# Patient Record
Sex: Male | Born: 1965 | Race: Black or African American | Hispanic: No | Marital: Married | Smoking: Former smoker
Health system: Southern US, Community
[De-identification: ages and names within clinical notes are randomized; demographics above are authoritative.]

## PROBLEM LIST (undated history)

## (undated) DIAGNOSIS — I340 Nonrheumatic mitral (valve) insufficiency: Secondary | ICD-10-CM

## (undated) DIAGNOSIS — I10 Essential (primary) hypertension: Secondary | ICD-10-CM

## (undated) DIAGNOSIS — Z992 Dependence on renal dialysis: Secondary | ICD-10-CM

## (undated) DIAGNOSIS — I351 Nonrheumatic aortic (valve) insufficiency: Secondary | ICD-10-CM

## (undated) DIAGNOSIS — I209 Angina pectoris, unspecified: Secondary | ICD-10-CM

## (undated) DIAGNOSIS — I2510 Atherosclerotic heart disease of native coronary artery without angina pectoris: Secondary | ICD-10-CM

## (undated) DIAGNOSIS — Z951 Presence of aortocoronary bypass graft: Secondary | ICD-10-CM

## (undated) DIAGNOSIS — I499 Cardiac arrhythmia, unspecified: Secondary | ICD-10-CM

## (undated) DIAGNOSIS — R55 Syncope and collapse: Secondary | ICD-10-CM

## (undated) DIAGNOSIS — N289 Disorder of kidney and ureter, unspecified: Secondary | ICD-10-CM

## (undated) DIAGNOSIS — R000 Tachycardia, unspecified: Secondary | ICD-10-CM

## (undated) DIAGNOSIS — Z954 Presence of other heart-valve replacement: Secondary | ICD-10-CM

## (undated) DIAGNOSIS — E663 Overweight: Secondary | ICD-10-CM

## (undated) DIAGNOSIS — M79606 Pain in leg, unspecified: Secondary | ICD-10-CM

## (undated) DIAGNOSIS — I729 Aneurysm of unspecified site: Secondary | ICD-10-CM

## (undated) DIAGNOSIS — I951 Orthostatic hypotension: Secondary | ICD-10-CM

## (undated) DIAGNOSIS — N186 End stage renal disease: Secondary | ICD-10-CM

## (undated) DIAGNOSIS — I509 Heart failure, unspecified: Secondary | ICD-10-CM

## (undated) DIAGNOSIS — R0789 Other chest pain: Secondary | ICD-10-CM

## (undated) HISTORY — DX: Tachycardia, unspecified: R00.0

## (undated) HISTORY — PX: ARTERIOVENOUS GRAFT PLACEMENT: SUR1029

## (undated) HISTORY — DX: Orthostatic hypotension: I95.1

## (undated) HISTORY — PX: UMBILICAL HERNIA REPAIR: SHX196

## (undated) HISTORY — PX: THROMBECTOMY: PRO61

## (undated) HISTORY — PX: OTHER SURGICAL HISTORY: SHX169

## (undated) HISTORY — PX: AV FISTULA PLACEMENT: SHX1204

## (undated) HISTORY — DX: Overweight: E66.3

## (undated) HISTORY — DX: Other chest pain: R07.89

## (undated) HISTORY — DX: Heart failure, unspecified: I50.9

## (undated) HISTORY — DX: Syncope and collapse: R55

## (undated) HISTORY — DX: Essential (primary) hypertension: I10

## (undated) HISTORY — DX: Aneurysm of unspecified site: I72.9

## (undated) HISTORY — DX: End stage renal disease: N18.6

## (undated) HISTORY — PX: HERNIA REPAIR: SHX51

## (undated) HISTORY — DX: Atherosclerotic heart disease of native coronary artery without angina pectoris: I25.10

## (undated) HISTORY — DX: Pain in leg, unspecified: M79.606

---

## 1998-04-23 ENCOUNTER — Ambulatory Visit (HOSPITAL_COMMUNITY): Admission: RE | Admit: 1998-04-23 | Discharge: 1998-04-23 | Payer: Self-pay | Admitting: Family Medicine

## 1998-10-16 ENCOUNTER — Ambulatory Visit (HOSPITAL_COMMUNITY): Admission: RE | Admit: 1998-10-16 | Discharge: 1998-10-16 | Payer: Self-pay

## 1998-11-05 ENCOUNTER — Ambulatory Visit (HOSPITAL_COMMUNITY): Admission: RE | Admit: 1998-11-05 | Discharge: 1998-11-05 | Payer: Self-pay

## 1998-12-17 ENCOUNTER — Ambulatory Visit (HOSPITAL_COMMUNITY): Admission: RE | Admit: 1998-12-17 | Discharge: 1998-12-17 | Payer: Self-pay

## 2001-07-26 ENCOUNTER — Ambulatory Visit (HOSPITAL_COMMUNITY): Admission: RE | Admit: 2001-07-26 | Discharge: 2001-07-26 | Payer: Self-pay | Admitting: Family Medicine

## 2001-07-26 ENCOUNTER — Encounter: Payer: Self-pay | Admitting: Family Medicine

## 2002-06-04 ENCOUNTER — Inpatient Hospital Stay (HOSPITAL_COMMUNITY): Admission: EM | Admit: 2002-06-04 | Discharge: 2002-06-09 | Payer: Self-pay | Admitting: Nephrology

## 2002-06-04 ENCOUNTER — Encounter: Payer: Self-pay | Admitting: Nephrology

## 2002-06-06 ENCOUNTER — Encounter: Payer: Self-pay | Admitting: Nephrology

## 2002-06-07 ENCOUNTER — Encounter: Payer: Self-pay | Admitting: Vascular Surgery

## 2002-07-18 ENCOUNTER — Encounter: Payer: Self-pay | Admitting: Vascular Surgery

## 2002-07-18 ENCOUNTER — Ambulatory Visit (HOSPITAL_COMMUNITY): Admission: RE | Admit: 2002-07-18 | Discharge: 2002-07-18 | Payer: Self-pay | Admitting: Vascular Surgery

## 2002-07-25 ENCOUNTER — Ambulatory Visit (HOSPITAL_COMMUNITY): Admission: RE | Admit: 2002-07-25 | Discharge: 2002-07-25 | Payer: Self-pay | Admitting: *Deleted

## 2002-08-29 ENCOUNTER — Ambulatory Visit (HOSPITAL_COMMUNITY): Admission: RE | Admit: 2002-08-29 | Discharge: 2002-08-29 | Payer: Self-pay | Admitting: Vascular Surgery

## 2002-10-01 ENCOUNTER — Ambulatory Visit (HOSPITAL_COMMUNITY): Admission: RE | Admit: 2002-10-01 | Discharge: 2002-10-01 | Payer: Self-pay | Admitting: Vascular Surgery

## 2003-03-06 ENCOUNTER — Inpatient Hospital Stay (HOSPITAL_COMMUNITY): Admission: EM | Admit: 2003-03-06 | Discharge: 2003-03-11 | Payer: Self-pay | Admitting: Emergency Medicine

## 2003-08-19 ENCOUNTER — Ambulatory Visit (HOSPITAL_COMMUNITY): Admission: RE | Admit: 2003-08-19 | Discharge: 2003-08-19 | Payer: Self-pay | Admitting: *Deleted

## 2005-01-12 ENCOUNTER — Ambulatory Visit (HOSPITAL_COMMUNITY): Admission: RE | Admit: 2005-01-12 | Discharge: 2005-01-12 | Payer: Self-pay | Admitting: Nephrology

## 2006-03-20 ENCOUNTER — Ambulatory Visit (HOSPITAL_COMMUNITY): Admission: RE | Admit: 2006-03-20 | Discharge: 2006-03-20 | Payer: Self-pay | Admitting: Nephrology

## 2006-05-09 ENCOUNTER — Ambulatory Visit: Payer: Self-pay | Admitting: Cardiology

## 2006-05-16 ENCOUNTER — Ambulatory Visit: Payer: Self-pay | Admitting: Cardiology

## 2006-05-31 ENCOUNTER — Ambulatory Visit: Payer: Self-pay | Admitting: Cardiology

## 2009-05-15 ENCOUNTER — Ambulatory Visit (HOSPITAL_COMMUNITY): Admission: RE | Admit: 2009-05-15 | Discharge: 2009-05-15 | Payer: Self-pay | Admitting: Nephrology

## 2009-06-04 ENCOUNTER — Ambulatory Visit: Payer: Self-pay | Admitting: Vascular Surgery

## 2009-06-05 ENCOUNTER — Ambulatory Visit (HOSPITAL_COMMUNITY): Admission: RE | Admit: 2009-06-05 | Discharge: 2009-06-05 | Payer: Self-pay | Admitting: Vascular Surgery

## 2009-06-05 ENCOUNTER — Ambulatory Visit: Payer: Self-pay | Admitting: Vascular Surgery

## 2009-07-09 ENCOUNTER — Ambulatory Visit: Payer: Self-pay | Admitting: Vascular Surgery

## 2009-07-14 ENCOUNTER — Ambulatory Visit: Payer: Self-pay | Admitting: Vascular Surgery

## 2009-07-14 ENCOUNTER — Ambulatory Visit (HOSPITAL_COMMUNITY): Admission: RE | Admit: 2009-07-14 | Discharge: 2009-07-14 | Payer: Self-pay | Admitting: Vascular Surgery

## 2009-08-06 ENCOUNTER — Ambulatory Visit (HOSPITAL_COMMUNITY): Admission: RE | Admit: 2009-08-06 | Discharge: 2009-08-06 | Payer: Self-pay | Admitting: Nephrology

## 2010-06-07 ENCOUNTER — Emergency Department (HOSPITAL_COMMUNITY)
Admission: EM | Admit: 2010-06-07 | Discharge: 2010-06-07 | Disposition: A | Discharge status: Home / Self Care | Payer: Medicare Other | Attending: Emergency Medicine | Admitting: Emergency Medicine

## 2010-06-07 ENCOUNTER — Emergency Department (HOSPITAL_COMMUNITY): Payer: Medicare Other

## 2010-06-07 DIAGNOSIS — R0602 Shortness of breath: Secondary | ICD-10-CM | POA: Insufficient documentation

## 2010-06-07 DIAGNOSIS — N19 Unspecified kidney failure: Secondary | ICD-10-CM | POA: Insufficient documentation

## 2010-06-07 DIAGNOSIS — Z7982 Long term (current) use of aspirin: Secondary | ICD-10-CM | POA: Insufficient documentation

## 2010-06-07 DIAGNOSIS — R0989 Other specified symptoms and signs involving the circulatory and respiratory systems: Secondary | ICD-10-CM | POA: Insufficient documentation

## 2010-06-07 DIAGNOSIS — Z79899 Other long term (current) drug therapy: Secondary | ICD-10-CM | POA: Insufficient documentation

## 2010-06-07 DIAGNOSIS — R0609 Other forms of dyspnea: Secondary | ICD-10-CM | POA: Insufficient documentation

## 2010-06-07 DIAGNOSIS — R000 Tachycardia, unspecified: Secondary | ICD-10-CM | POA: Insufficient documentation

## 2010-06-07 DIAGNOSIS — R079 Chest pain, unspecified: Secondary | ICD-10-CM | POA: Insufficient documentation

## 2010-06-07 LAB — CK TOTAL AND CKMB (NOT AT ARMC)
CK, MB: 1.5 ng/mL (ref 0.3–4.0)
Relative Index: 0.9 (ref 0.0–2.5)

## 2010-06-07 LAB — DIFFERENTIAL
Basophils Absolute: 0.1 10*3/uL (ref 0.0–0.1)
Basophils Relative: 0 % (ref 0–1)
Eosinophils Absolute: 0.3 10*3/uL (ref 0.0–0.7)
Eosinophils Relative: 2 % (ref 0–5)
Lymphocytes Relative: 9 % — ABNORMAL LOW (ref 12–46)
Lymphs Abs: 1.5 10*3/uL (ref 0.7–4.0)
Monocytes Absolute: 1 10*3/uL (ref 0.1–1.0)
Monocytes Relative: 6 % (ref 3–12)
Neutro Abs: 13.8 10*3/uL — ABNORMAL HIGH (ref 1.7–7.7)
Neutrophils Relative %: 83 % — ABNORMAL HIGH (ref 43–77)

## 2010-06-07 LAB — COMPREHENSIVE METABOLIC PANEL
ALT: 13 U/L (ref 0–53)
AST: 14 U/L (ref 0–37)
Albumin: 3.6 g/dL (ref 3.5–5.2)
Alkaline Phosphatase: 62 U/L (ref 39–117)
BUN: 39 mg/dL — ABNORMAL HIGH (ref 6–23)
CO2: 22 mEq/L (ref 19–32)
Calcium: 9.1 mg/dL (ref 8.4–10.5)
Chloride: 98 mEq/L (ref 96–112)
Creatinine, Ser: 10.64 mg/dL — ABNORMAL HIGH (ref 0.4–1.5)
GFR calc Af Amer: 6 mL/min — ABNORMAL LOW (ref >60)
GFR calc non Af Amer: 5 mL/min — ABNORMAL LOW (ref >60)
Glucose, Bld: 87 mg/dL (ref 70–99)
Potassium: 3.7 mEq/L (ref 3.5–5.1)
Sodium: 136 mEq/L (ref 135–145)
Total Bilirubin: 0.8 mg/dL (ref 0.3–1.2)
Total Protein: 8.7 g/dL — ABNORMAL HIGH (ref 6.0–8.3)

## 2010-06-07 LAB — CBC
HCT: 43.7 % (ref 39.0–52.0)
Hemoglobin: 14.9 g/dL (ref 13.0–17.0)
MCH: 26.3 pg (ref 26.0–34.0)
MCHC: 34.1 g/dL (ref 30.0–36.0)
MCV: 77.1 fL — ABNORMAL LOW (ref 78.0–100.0)
Platelets: 256 10*3/uL (ref 150–400)
RBC: 5.67 MIL/uL (ref 4.22–5.81)
RDW: 14 % (ref 11.5–15.5)
WBC: 16.8 10*3/uL — ABNORMAL HIGH (ref 4.0–10.5)

## 2010-06-07 LAB — APTT: aPTT: 29 seconds (ref 24–37)

## 2010-06-07 LAB — POCT CARDIAC MARKERS
CKMB, poc: 2.3 ng/mL (ref 1.0–8.0)
Myoglobin, poc: >500 ng/mL (ref 12–200)
Troponin i, poc: <0.05 ng/mL (ref 0.00–0.09)

## 2010-06-07 LAB — PROTIME-INR
INR: 1.04 (ref 0.00–1.49)
Prothrombin Time: 13.8 seconds (ref 11.6–15.2)

## 2010-06-07 LAB — TROPONIN I: Troponin I: 0.02 ng/mL (ref 0.00–0.06)

## 2010-06-07 LAB — D-DIMER, QUANTITATIVE: D-Dimer, Quant: 2.32 ug/mL-FEU — ABNORMAL HIGH (ref 0.00–0.48)

## 2010-06-07 LAB — BRAIN NATRIURETIC PEPTIDE: Pro B Natriuretic peptide (BNP): <30 pg/mL (ref 0.0–100.0)

## 2010-06-15 ENCOUNTER — Ambulatory Visit (HOSPITAL_COMMUNITY)
Admission: RE | Admit: 2010-06-15 | Discharge: 2010-06-15 | Disposition: A | Discharge status: Home / Self Care | Payer: Medicare Other | Source: Ambulatory Visit | Attending: Surgery | Admitting: Surgery

## 2010-06-15 DIAGNOSIS — T82898A Other specified complication of vascular prosthetic devices, implants and grafts, initial encounter: Secondary | ICD-10-CM

## 2010-06-15 DIAGNOSIS — Y832 Surgical operation with anastomosis, bypass or graft as the cause of abnormal reaction of the patient, or of later complication, without mention of misadventure at the time of the procedure: Secondary | ICD-10-CM | POA: Insufficient documentation

## 2010-06-15 DIAGNOSIS — I120 Hypertensive chronic kidney disease with stage 5 chronic kidney disease or end stage renal disease: Secondary | ICD-10-CM

## 2010-06-15 DIAGNOSIS — N186 End stage renal disease: Secondary | ICD-10-CM

## 2010-06-15 LAB — POCT I-STAT, CHEM 8
Calcium, Ion: 0.95 mmol/L — ABNORMAL LOW (ref 1.12–1.32)
Glucose, Bld: 83 mg/dL (ref 70–99)
HCT: 48 % (ref 39.0–52.0)
Hemoglobin: 16.3 g/dL (ref 13.0–17.0)
Potassium: 4.1 mEq/L (ref 3.5–5.1)

## 2010-06-20 NOTE — Op Note (Addendum)
  [name] [name], [name] [name]                 ACCOUNT NO.:  [id number]  MEDICAL RECORD NO.:  [id number]           PATIENT TYPE:  O  LOCATION:  SDSC                         FACILITY:  [facility]  PHYSICIAN:  Theotis Burrow IV, MDDATE OF BIRTH:  [birth year]  DATE OF PROCEDURE:  06/15/2010 DATE OF DISCHARGE:  06/15/2010                              OPERATIVE REPORT   PREOPERATIVE DIAGNOSIS:  End-stage renal disease.  POSTOPERATIVE DIAGNOSIS:  End-stage renal disease.  PROCEDURE PERFORMED: 1. Ultrasound access right forearm Gore-Tex graft. 2. Fistulogram. 3. Percutaneous transluminal angioplasty, right forearm dialysis     graft.  INDICATIONS:  [name] is a 45 year old gentleman with end-stage renal disease.  He is having trouble with access and cannulation and dialysis. He had a thrombectomy and revision of his forearm graft approximately a year ago.  He comes in today for evaluation.  PROCEDURE:  The patient was identified in the holding area, taken to room #8, placed supine on the table.  Right arm was prepped and draped in usual fashion.  A time-out was called.  Ultrasound was used to evaluate the graft, this was found to be widely patent, 1% lidocaine was used for local anesthesia, and the graft was accessed under ultrasound guidance with a micropuncture needle and an 0.18 wire was advanced into the graft, and a micropuncture sheath was placed.  Contrast injections were then performed.  Reflux was used to evaluate the arterial anastomosis.  FINDINGS:  The right forearm graft is widely patent.  The arteriovenous anastomosis is widely patent.  There is an area of luminal narrowing within the venous limb of the graft itself.  The venous outflow track is widely patent.  There is an area of luminal narrowing, which is not hemodynamically significant in the proximal arm.  There is no central venous stenosis.  Overall, the graft looked patent.  However, with the access troubles, I felt  that dilating the venous limb of the graft was appropriate given its somewhat irregular appearance.  Over a Bentson wire, a 6-French sheath was placed and a 6 x 4 Dorado balloon was used to perform balloon angioplasty of the venous limb of the graft.  A completion arteriogram was performed, which showed improvement in the appearance of the venous limb of the graft.  At this point, catheters and wires were removed.  A temporary closure was performed with a stop-cock.  The patient will be taken to the holding area.  There are no complication.  IMPRESSION: 1. No evidence of central venous stenosis. 2. Somewhat irregular appearance of the venous side of the forearm     graft.  This was dilated with a 6 x 4 Dorado balloon with good     results.     Eldridge Abrahams, MD     VWB/MEDQ  D:  06/15/2010  T:  06/15/2010  Job:  [id number]  Electronically Signed by Orvan Falconer IV MD on 06/20/2010 07:40:52 PM

## 2010-07-07 LAB — POCT I-STAT 4, (NA,K, GLUC, HGB,HCT): Glucose, Bld: 84 mg/dL (ref 70–99)

## 2010-07-11 LAB — POCT I-STAT 4, (NA,K, GLUC, HGB,HCT)
Glucose, Bld: 92 mg/dL (ref 70–99)
Hemoglobin: 15 g/dL (ref 13.0–17.0)
Potassium: 4.1 mEq/L (ref 3.5–5.1)

## 2010-08-31 NOTE — Assessment & Plan Note (Signed)
OFFICE VISIT   [name] [name], [name] [name]  DOB:  1965/09/21                                       06/04/2009  [id number]   I saw the patient in the office today to evaluate him for access.  He  had a right upper arm AV fistula placed in November of 2004 and he has  been using this fistula since that time.  He developed some aneurysmal  areas and underwent a fistulogram on 05/15/2009 which demonstrated some  central outflow cephalic vein occlusion secondary to clot and this could  not be successfully declotted.  He had significant collaterals  maintaining patency of the fistula.  We were asked to evaluate the  fistula and consider future access.  Of note he dialyzes on Mondays,  Wednesdays and Fridays.  He has had no recent shortness of breath,  nausea or other uremic symptoms.  He has had no pain associated with use  of the fistula.  He states the fistula has been working adequately.  Of  note, he had a fistula in the upper arm on the left side which became  aneurysmal and apparently had a significant bleeding complication  related to this.   PAST MEDICAL HISTORY:  Significant for congestive heart failure which  has been stable and he has had no recent problems with his heart  failure.   Otherwise the patient denies any history of diabetes, hypertension,  hypercholesterolemia, history of previous myocardial infarction or  history of COPD.   SOCIAL HISTORY:  He is married.  He has two children.  He quit tobacco  17 years ago.   REVIEW OF SYSTEMS:  He has lost some weight recently, and he has had a  poor appetite.  He is 365 pounds and 6 feet tall.  CARDIOVASCULAR:  He has had no chest pain, chest pressure, palpitations  or arrhythmias.  He has had no significant orthopnea or dyspnea on  exertion.  He has some mild leg claudication.  He has had no history of  stroke, TIAs.  PULMONARY:  He has had no productive cough, bronchitis, asthma or  wheezing.   PHYSICAL EXAMINATION:  General:  This is a pleasant 45 year old  gentleman who appears his stated age.  Vital signs:  His blood pressure  is 109/73, heart rate is 83, temperature 97.7.  HEENT:  Unremarkable.  Lungs:  Are clear bilaterally to auscultation without rales, rhonchi or  wheezing.  Cardiovascular:  He has a regular rate and rhythm without  murmur appreciated.  He has mild peripheral edema bilaterally.  Both  feet are warm and well-perfused.  Abdomen:  Soft and nontender with  normal pitched bowel sounds.  Extremities:  Exam reveals an upper arm  fistula which has a good thrill and bruit.  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  Neurologic:  Exam is nonfocal.   I have explained that I think these large aneurysms in the right arm are  at risk for continued enlargement and especially the more centrally  located aneurysm has thinned out skin over it and I think he is at risk  for significant bleeding if this were to rupture.  For this reason I do  not think there is any way to salvage the upper arm fistula and  I have  recommended we place a graft in the right arm.  We would also ligate his  fistula to prevent the risk of bleeding.  He would therefore temporarily  require a Palindrome catheter for dialysis for a month until his graft  could be used.  Given the bleeding problems he had in the left arm the  family is very anxious to get this done as soon as possible so we will  schedule this for tomorrow and he will change his dialysis days which is  normally Monday, Wednesday, Friday.     Judeth Cornfield. Scot Dock, M.D.  Electronically Signed   CSD/MEDQ  D:  06/04/2009  T:  06/05/2009  Job:  [id number]   cc:   Alison Murray, M.D.

## 2010-08-31 NOTE — Assessment & Plan Note (Signed)
OFFICE VISIT   [name] [name], CUNY R  DOB:  09/21/1965                                       07/09/2009  [id number]   DATE OF SURGERY:  06/05/2009.   Patient returns to clinic for evaluation of his right forearm graft due  to a suture exposure.  As an incidental finding on the 21st, it was  noted that his graft was occluded.   Past medical history is consistent with congestive heart failure,  parathyroid removal, and __________  hernia.  He dialyzes on Monday,  Wednesday, and Fridays.   SOCIAL HISTORY:  He is married with 2 children, and he is disabled.  He  has a remote history of tobacco use with discontinuation 15 years ago.  He does not drink alcohol on a regular basis.   Review of systems was with the exception of kidney disease and rash on  his back, negative.   Physical findings revealed a very pleasant gentleman who appeared his  stated age.  His height was 6.1.  His weight was 178 pounds.  Heart rate  was 79.  Blood pressure 133/95.  O2 sat was 97%.  He appeared well-  nourished was in no distress.  HEENT:  PERRLA, EOMI, normal  conjunctivae.  Mucous membranes were pink and moist.  Lungs were clear.  Cardiac exam revealed a regular rate and rhythm.  The abdomen was soft,  nontender.  Musculoskeletal evaluation demonstrated no major  deformities.  There were no focal weaknesses or paresthesias.  Skin  demonstrated no ulcers.  A rash was present on his back.  Attention was  turned to his right forearm arteriovenous graft.  It was clotted.  There  was no thrill or pulsations in it.  There was a suture in the incision.  This was cut.   This case was discussed with Dr. Scot Dock.  Patient will need a  thrombectomy.  We are scheduling this for 07/14/2009.  Of note, Dr.  Scot Dock enter the room to speak with patient.  He did evaluate the graft  and did agree with the thrombectomy plans.   Chad Cordial, PA   Judeth Cornfield. Scot Dock, M.D.  Electronically Signed   KEL/MEDQ  D:  07/09/2009  T:  07/09/2009  Job:  [id number]

## 2010-08-31 NOTE — Procedures (Signed)
CEPHALIC VEIN MAPPING   INDICATION:  Preop evaluation for dialysis access site.   HISTORY:  Chronic kidney disease.   EXAM:  The right basilic vein is compressible with diameter measurements  ranging from 0.32 to 0.79 cm.   The left basilic vein is compressible with diameter measurements ranging  from 0.33 to 0.82 cm.   See attached worksheet for all measurements.   IMPRESSION:  1. Patent bilateral basilic veins noted with diameter measurements as      described above.  2. The bilateral cephalic veins were not imaged at the request of Dr.      Scot Dock.   ___________________________________________  Judeth Cornfield. Scot Dock, M.D.   CH/MEDQ  D:  06/04/2009  T:  06/04/2009  Job:  [id number]

## 2010-09-03 NOTE — Discharge Summary (Signed)
NAMEGRIMM, BASSANI                           ACCOUNT NO.:  [id number]   MEDICAL RECORD NO.:  [id number]                   PATIENT TYPE:  INP   LOCATION:  5527                                 FACILITY:  [facility]   PHYSICIAN:  Reginia Forts, M.D.                  DATE OF BIRTH:  [birth year]   DATE OF ADMISSION:  06/04/2002  DATE OF DISCHARGE:  06/09/2002                                 DISCHARGE SUMMARY   CONSULTS:  1. CVTS.  2. Nutrition.  3. Social work.   PROCEDURES:  1. Placement of left IJ __________ catheter by CVTS.  2. Placement of left forearm AV graft by CVTS.  3. Vein mapping of upper extremity.  4. Renal ultrasound.  5. Chest x-ray.  6. EKG.   DISCHARGE DIAGNOSES:  1. 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  2. Morbid obesity.  3. Hypertension.  4. Anemia, iron deficiency.  5. Obstructive sleep apnea.  6. Chronic lower back pain.   DISCHARGE MEDICATIONS:  1. Os-Cal 500 mg one tablet p.o. t.i.d. with meals.  2. Nephro-Vite one tablet p.o. daily.  3. Tylenol 325 mg 1-2 tablets p.o. q.4h. p.r.n. pain.  4. Tiazac 360 mg one tablet p.o. daily.   DIALYSIS ORDERS:  1. Duration of four hours, blood flow rate of 300, and increased to 400 over     the next two treatments.  2. Potassium 2.0.  Heparin is routine.  3. Estimated dry weight is not established yet, and the patient's weight     shoulder be decreased 4-5 kg at each hemodialysis until new estimated dry     weight is determined.  4. Epogen 5000 units IV q hemodialysis.  5. Ansaid 100 mg IV q hemodialysis x8, then 100 mg IV q week.   FOLLOW UP:  1. The patient has an appointment with Dr. __________ in [place] (office     number is [phone]) on Tuesday, 06/11/02 at 10 a.m.  Address is [address] in [place].  [place].  The patient should call [facility] at     [phone] on day of discharge to determine  dialysis time.   HOSPITAL COURSE:  [name] [name] is a 45 year old African-American male with a  past medical history significant for morbid obesity and hypertension,  presenting originally to [facility] emergency department on day of  presentation secondary to a 2-3 week history of progressive dyspnea on  exertion and chest pain.  Upon evaluation, the patient was found to have a  creatinine of 12.8.  The patient was taken to [facility] for  nephrology evaluation, work-up, and hemodialysis.  On work-up, the patient  was found to have greater than 15 gm of protein in his 24-hour urine  collection.  Renal ultrasound without evidence of obstruction; however, had  increased  echogenicity.  HIV was negative during hospitalization.  Iron  studies were found to be low, as well.  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  Biopsy was not performed  during hospitalization secondary to results not contributing to management  at this time.  CVTS was consulted during hospitalization.  The patient  underwent placement of a left IJ __________ catheter and a left forearm AV  graft.  The patient received two hemodialysis sessions during his  hospitalization.  The patient met with nutrition during his hospitalization  to review appropriately on diet.  Social work also worked closely with the  [employer], as well as the physician in [place] in regards to  transferring care there as an outpatient.  The patient was discharged on  hospital day #5 in stable condition to follow up in [place], and to  receive hemodialysis and medicine.   DISCHARGE LABORATORIES:  Sodium 142, potassium 4.5, chloride 110, carbon  dioxide 20, glucose 91, BUN 80, creatinine 12.9, calcium 7.3.  Cholesterol  192, triglycerides 115, HDL 43, LDL 126.  Intact parathyroid hormone is  pending.  WBC 13.4,  hemoglobin 9.7, hematocrit 29.4, platelets 382, PT 14.5,  INR 1.1, PTT 32, bleeding time of 8.5.  CK of 490, then 420, then 394.  CK-  MB of 12.3, then 9.4, then 8.0.  Relative index 2.5, then 2.2, then 2.0.  Troponin I of 0.06, then 0.07, then 0.05.  Iron levels 29.  Total iron  binding capacity of 196.  Percent saturation of 15%.  Urine drug screen was  negative.  HIV nonreactive.  A 24-hour urine revealed 15,840 mg of protein,  creatinine 2.2 g, and a total volume of 3600 mL.  Urinalysis revealed  _________ hazy, specific gravity 1.017, pH of 5.5, hemoglobin moderate,  protein greater than 300, nitrite negative, leukocyte esterase small,  epithelials, few, white blood cells 21-50, red blood cells 21-50, bacteria  few.  Urine culture was negative.  Serum sodium was 69, serum creatinine  87.1.  Renal ultrasound revealed increased cortical echogenicity compatible  with medical renal parenchymal disease.  There was no evidence of  hydronephrosis.  EKG revealed normal sinus rhythm with flattened T waves  throughout.  No acute ST elevation or depression.    DISCHARGE INSTRUCTIONS:  1. Pain management.  The patient is instructed he may use Tylenol 1-2     tablets q.4h. as needed for pain.  2. Activity - no restrictions.  3. Diet - renal specific diet as outlined during hospitalization.                                               Reginia Forts, M.D.    KS/MEDQ  D:  06/09/2002  T:  06/09/2002  Job:  [id number]   cc:   Dr. ?, nephrologist in [place], [place]. Scot Dock, M.D.  [address]  [place]  [place] [address]  Fax: [phone]

## 2010-09-03 NOTE — H&P (Signed)
[name] [name], [name] [name]                           ACCOUNT NO.:  [id number]   MEDICAL RECORD NO.:  [id number]                   PATIENT TYPE:  INP   LOCATION:  5527                                 FACILITY:  [facility]   PHYSICIAN:  Maudie Flakes. Hassell Done, M.D.                DATE OF BIRTH:  [birth year]   DATE OF ADMISSION:  06/04/2002  DATE OF DISCHARGE:  06/09/2002                                HISTORY & PHYSICAL   CHIEF COMPLAINT:  Dyspnea on exertion and chest pain.   HISTORY OF PRESENT ILLNESS:  The patient is a 45 year old African-American  male with a past medical history significant for morbid obesity,  hypertension, presenting originally to [facility] Emergency  Department on the day of presentation, secondary to a two to three-week-  history of dyspnea on exertion and chest pain.  The patient has become  progressively worse with shortness of breath and progressive dyspnea on  exertion.  The patient was also appreciating diffuse chest pain with  exertion, which has been progressively worsening for the past two weeks.  The patient also having difficulty with urinating, with decreased urine  output for the past three weeks.  Positive urinary urgency and frequency.  Positive fever with a T-Max of 101.9 degrees.  Decreased urine for  approximately five days prior to presentation.  The urine is dark and yellow  in coloration.  The patient presented to the [facility] Emergency  Department today, secondary to the progressive symptoms and was found to  have a creatinine of 12.8 and a urinalysis revealed 20-30 white blood cells  with moderate bacteria.  The patient was also found to have a potassium of  4.6.  The patient was transferred to [facility], to be  admitted under the care of the renal service, secondary to acute renal  failure.  Positive for worsening lower extremity edema for the past three  weeks.  Positive shakiness.  No history of kidney stones.  No  history of  NSAID use.  No recent antibiotic use.  No severe abdominal or groin pain.  No recent rash.  No itching.  Positive for dry skin.  Positive cough which  is dry and hacky, without sputum production.  Of note, the baseline  creatinine in June 2002, was 2.0.  A 24-hour urine performed at that time  revealed 3.3 g of protein.   PAST MEDICAL HISTORY:  1. Morbid obesity with a baseline weight of approximately 500 pounds.  2. Hypertension diagnosed approximately one year ago.  3. Obstructive sleep apnea.  4. Chronic lower back pain.  5. Status post hernia repair.   MEDICATIONS:  1. Tiazac 360 mg q.o.d., 420 mg q.o.d.  2. Hydrochlorothiazide 25 mg p.o. daily.  The patient has not been taking     this for the past two months.  3. Over-the-counter cough medicine p.r.n.  4. Tylenol p.r.n. pain.  ALLERGIES:  No known drug allergies.   SOCIAL HISTORY:  The patient lives in [place] with his wife and daughter  and stepson.  He has been married for the past 14 years.  He dips tobacco  for the past 10 years.  No alcohol or illicit drugs.  He is currently  unemployed.   FAMILY HISTORY:  Father died at age 82, secondary to congestive heart  failure.  Positive for diabetes mellitus, positive for hypertension,  positive for renal insufficiency; however, no dialysis.  Positive for benign  prostatic hypertrophy.  Mother is alive at age 10.  She has diabetes  mellitus and hypertension.  A brother has diabetes mellitus and  hypertension.  A sister has hypertension.   REVIEW OF SYSTEMS:  Positive for recent weight loss.  Positive anorexia with  early satiety.  Positive thirst with polydipsia.  No itching.  Positive for  intermittent nausea.  No vomiting.  One episode of diarrhea in the last  weeks.  No melena, no bloody stools.  No urinary hesitancy.   PHYSICAL EXAMINATION:  VITAL SIGNS:  Temperature 99.1 degrees, pulse 94,  blood pressure 166/94, respirations 20.  O2 saturation 99% on 2  L.  GENERAL:  A well-nourished, well-developed, morbidly obese male, in no acute  distress.  He is polite and cooperative.  He is alert and oriented x3.  HEENT:  Sclerae anicteric.  Mucous membranes are moist.  NECK:  Obese, supple, no lymphadenopathy, no jugular venous distention.  No  carotid bruits.  CARDIOVASCULAR:  Tachycardic, a regular rhythm.  Distant heart sounds.  No  murmur, no jugular venous distention.  LUNGS:  Distant breath sounds.  No tachypnea.  No rales, no wheezing.  ABDOMEN:  Morbidly obese, soft, nondistended.  The bladder is not palpable.  CVA/BACK:  No CVA tenderness.  EXTREMITIES:  With 2+ pitting edema bilaterally up to the knees.  SKIN:  No petechiae, no purpura.  Positive hyper-pigmented rash on the upper  portion of the back.  NEUROLOGIC:  Cranial nerves II-XII intact.  Motor is 5/5.  Sensation is  intact throughout.   LABORATORY DATA:  Sodium 139, potassium 4.6, chloride 112, CO2 of 17, BUN  79, creatinine 12.8, glucose 95.  White blood cells 12.9, hemoglobin 10.1,  platelets 394.  PT 13.6, INR 1.2, PTT 28.3, CK 519, MB 14, index 2.6,  troponin I of 0.07.  Urinalysis revealed 20-30 wbc's and moderate bacteria.  Electrocardiogram revealed a sinus tachycardia with a rate of 111, no acute  changes.   ASSESSMENT/PLAN:  1. Acute renal failure:  Patient apparently initiated workup for renal     failure in June 2002.  A nephrotic syndrome appreciated on a 24-hour     urine approximately 1-1/2 years ago.  Will check a renal ultrasound to     rule out contributing obstructive process and evaluate the size of the     kidneys.  Will also repeat a urinalysis and urine culture.  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  The electrolytes are     stable currently.  Volume status is stable; thus no emergent indication     for dialysis at this time. 2. Hypertension:  Will monitor throughout the hospitalization and treat if     indicated.  Potentially  secondary to chronic renal insufficiency and     acute renal failure.  3. Obstructive sleep apnea:  Will have respiratory therapy to set for CPAP.  4. Morbid obesity:  This  will be a limiting factor in any imaging studies     that will be performed on hospitalization.  5. Anemia, potentially secondary to chronic renal insufficiency:  Will check     hemoccults and check iron studies during the hospitalization.     Reginia Forts, M.D.                        Richard F. Hassell Done, M.D.    KS/MEDQ  D:  07/16/2002  T:  07/16/2002  Job:  [id number]

## 2010-09-03 NOTE — Op Note (Signed)
NAMEJIMMEL, STUPAK                           ACCOUNT NO.:  [id number]   MEDICAL RECORD NO.:  [id number]                   PATIENT TYPE:  OIB   LOCATION:  2899                                 FACILITY:  [facility]   PHYSICIAN:  Judeth Cornfield. Scot Dock, M.D.        DATE OF BIRTH:  05/19/1965   DATE OF PROCEDURE:  08/29/2002  DATE OF DISCHARGE:  08/29/2002                                 OPERATIVE REPORT   PREOPERATIVE DIAGNOSIS:  Chronic renal failure.   POSTOPERATIVE DIAGNOSIS:  Chronic renal failure.   PROCEDURE:  Placement of new left upper arm AV graft.   SURGEON:  Judeth Cornfield. Scot Dock, M.D.   ASSISTANT:  Lydia Guiles, P.A.   ANESTHESIA:  Local with sedation.   TECHNIQUE:  The patient was taken to the operating room, sedated by  anesthesia.  The entire left upper extremity and axilla were prepped and  draped in the usual sterile fashion.  A longitudinal incision was made above  the antecubital space after the skin was anesthetized, and here, the  brachial artery was dissected free and controlled with a blue vessel loop.  The posterior branch was controlled with a red vessel loop.  A separate  longitudinal incision was made beneath the axilla after the skin was  anesthetized, and here, the brachial vein was dissected free.  It was a  nice, large vein.  It was quite deep.  This patient weighs 500 pounds.  A  graft was then tunneled in the upper arm with a gentle curve distally to  allow longer exposure of the graft for cannulation.  The patient was then  heparinized.  This was a 4-7 mm graft.  The brachial artery was clamped  proximally and distally, and a longitudinal arteriotomy was made.  The  majority of the 4 mm end of the graft was excised.  The graft was slightly  spatulated and sewn end-to-side to the brachial artery using continuous 6-0  Prolene suture.  The graft was pulled to the appropriate length for  anastomosis to the high brachial vein.  The vein was  ligated distally and  spatulated proximally.  The graft was cut to the appropriate length,  spatulated, and sewn end-to-end to the vein using continuous 6-0 Prolene  suture.  At the completion, there was an excellent thrill in the graft and a  palpable radial pulse.  Hemostasis was obtained in the wound, and the wound  was then closed with a deep layer of 3-0 Vicryl, and the skin was closed  with 4-0 Vicryl.  A sterile dressing was applied.  The patient tolerated the  procedure well and was transferred to the recovery room in satisfactory  condition.  All needle and sponge counts were correct.  Judeth Cornfield. Scot Dock, M.D.    CSD/MEDQ  D:  08/29/2002  T:  08/30/2002  Job:  [id number]

## 2010-09-03 NOTE — Op Note (Signed)
[name] [name], [name] [name]                           ACCOUNT NO.:  [id number]   MEDICAL RECORD NO.:  [id number]                   PATIENT TYPE:  INP   LOCATION:  5527                                 FACILITY:  [facility]   PHYSICIAN:  Judeth Cornfield. Scot Dock, M.D.        DATE OF BIRTH:  [birth year]   DATE OF PROCEDURE:  06/07/2002  DATE OF DISCHARGE:                                 OPERATIVE REPORT   PREOPERATIVE DIAGNOSIS:  Chronic renal failure.   POSTOPERATIVE DIAGNOSIS:  Chronic renal failure.   PROCEDURES:  1. Ultrasound of bilateral internal jugular veins.  2. Placement of left internal jugular Diatek catheter (32 cm).  3. Placement of left forearm arteriovenous graft.   SURGEON:  Judeth Cornfield. Scot Dock, M.D.   ASSISTANT:  Marcellus Scott, P.A.   ANESTHESIA:  Local with sedation.   DESCRIPTION OF PROCEDURE:  The patient was taken to the operating room and  sedated by anesthesia.  I identified both internal jugular veins with the  ultrasound scanner.  The left side was larger.  The neck and upper chest  were then prepped and draped in the usual sterile fashion.  After the skin  was infiltrated with 1% lidocaine, the right IJ was cannulated; however, the  patient moved several times and then after having successfully cannulated, I  was unable to cannulate it again.  Rather than persist I went to the left  side, which had a large IJ.  The skin was anesthetized and the left IJ was  cannulated.  A guidewire was introduced into the superior vena cava.  The  tract over the wire was dilated and then a dilator and peel-away sheath were  passed over the wire and the dilator was removed.  The 32 cm catheter was  passed over the wire, through the peel-away sheath, and positioned in the  right atrium.  The wire was removed and the peel-away sheath removed.  The  exit site for the catheter was selected and the skin anesthetized between  the areas, and the catheter was brought out through the tunnel  and the  distal ports were attached.  Both ports withdrew easily, were then flushed  with heparinized saline and filled with concentrated heparin.  The catheter  was secured at its exit site with a 3-0 nylon suture.  The IJ cannulation  site was closed with a 4-0 subcuticular stitch.  A sterile dressing was  applied.   Next attention was turned to placement of a forearm AV graft.  The left  upper extremity was prepped and draped in the usual sterile fashion.  After  the skin was anesthetized with 1% lidocaine, a transverse incision was made  at the antecubital level.  Here the cephalic vein was too small for a  fistula.  Therefore, the brachial artery and brachial vein beneath the  fascia were dissected free.  A 4-7 mm graft was then tunneled in a loop  fashion  in the forearm with the arterial aspect of the graft along the  radial aspect of the forearm and the venous aspect of the graft along the  ulnar aspect of the forearm.  The patient was then heparinized.  The  brachial artery was clamped proximally and distally, and a longitudinal  arteriotomy was made.  A short segment of the 4 mm end of the graft was  excised, the graft slightly spatulated and sewn end-to-side to the brachial  artery using continuous 6-0 Prolene suture.  The graft was then pulled to  the appropriate length for anastomosis to the brachial vein.  The vein was  ligated distally and spatulated proximally.  The graft was cut to the  appropriate length, spatulated, and sewn end-to-end to the vein using  continuous 6-0 Prolene suture.  At completion there was an excellent thrill  in the graft and a palpable radial pulse.  Hemostasis was obtained in the  wound.  The wound was closed with a deep layer of 3-0 Vicryl and the skin  closed with 4-0 Vicryl.  A sterile dressing was applied.  The patient  tolerated the procedure well and was transferred to the recovery room in  satisfactory condition.  All needle and sponge  counts were correct except  that one Ray-Tec was missing.  An x-ray was obtained, and this was not  identified in the wound.                                               Judeth Cornfield. Scot Dock, M.D.    CSD/MEDQ  D:  06/07/2002  T:  06/08/2002  Job:  [id number]

## 2010-09-03 NOTE — Discharge Summary (Signed)
[name] [name], [name] [name]                           ACCOUNT NO.:  [id number]   MEDICAL RECORD NO.:  [id number]                   PATIENT TYPE:  INP   LOCATION:  5511                                 FACILITY:  [facility]   PHYSICIAN:  Beatrice Lecher, M.D.            DATE OF BIRTH:  10-19-1965   DATE OF ADMISSION:  03/06/2003  DATE OF DISCHARGE:  03/11/2003                                 DISCHARGE SUMMARY   DISCHARGE DIAGNOSES:  1. Rupture of an AV graft in the left upper arm secondary to pseudoaneurysm.  2. End-stage renal disease secondary to hypertension, dialyzes Monday,     Wednesday, Friday evenings.  3. Hypertension previously, though now off of medication.  4. Morbid obesity.  5. Chronic back pain.  6. History of hernia repair.  7. Obstructive sleep apnea.  8. 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  9. New insertion of a left IV Diatek catheter and new right AV fistula. Both     were placed on March 10, 2003.   DISCHARGE MEDICATIONS:  1. Nephro-Vite one tablet p.o. q.d.  2. Renagel three tablets with each meal.  3. Celebrex p.r.n. for back pain.  4. Cipro 500 mg one p.o. q.d. for three weeks.   HEMODIALYSIS ORDERS:  1. Continue all previous orders.  2. EPO 10,000 units with each dialysis.  3. Tressie Ellis 2 g with each dialysis for a total of three weeks.  4. Venofer 100 mg with each dialysis x3 and then once a week.   DIET:  Renal.   PAIN MANAGEMENT:  Tylenol p.r.n.   FOLLOW UP:  At [facility] hemodialysis on Friday and also an appointment  with Dr. Oneida Alar on March 28, 2003 to look at his graft.   HOSPITAL COURSE:  Mr. Heins is an 45 year old African-American male with a  history of morbid obesity, end-stage renal disease secondary to hypotension  who came in actually as a transfer from [facility]  complaining of a two-week history of a boil-like area over this left upper  arm graft site. The patient had been putting an  antibiotic ointment over the  area and using warm towels. The patient said that he had seen some bloody  drainage and that then the area scabbed over. The patient had seen Dr.  Lowanda Foster last week who assessed the arm and said to continue his current  care. The morning of admission, the area burst open, and EMS was called. The  patient was seen immediately in the ED, and two large bore catheters were  placed, and the patient was given large amounts of fluid to restore his  large amount of volume loss. His hemoglobin on admission was 10.1. There was  also some concern that there may be a direct source of infection into that  graft site, and thus patient was taken to the OR immediately, and the graft  was partially removed and ligated. The patient  did stabilize, though did end  up receiving two blood transfusions, one on November 19 and one on November  22. The following problems were addressed during admission:   1. Rupture of pseudoaneurysm over AV graft. The patient was stabilized and     immediately taken to the OR for ligation on the day of admission. The     patient did well and then on November 22 received a new left IJ Diatek     catheter and a right AV fistula, and patient has outpatient followup with     Dr. Oneida Alar.  2. End-stage renal disease. The patient was dialyzed while he was here     through his new Diatek catheter. Initially over the weekend, the patient     did have an IJ central line placed through which we did dialysis. The     patient is to basically continue most of his previous hemodialysis orders     as above.  3. Hypotension. The patient has a previous history of hypertension off of     medications secondary to volume loss. Was hypotensive on admission. The     patient was resuscitated appropriately.  4. The patient was treated with vancomycin and Fortaz for a total of four     weeks with the assumption that because the AV graft had some drainage and     scab form  and rupture that most likely the site was contaminated. The     patient will continue the antibiotics as an outpatient. Actually will     continue Fortaz and Cipro as the culture grew out Enterobacter cloacae.  5. Secondary hyperparathyroidism. The patient was continued on his Renagel.  6. Anemia. The patient was transfused for acute volume loss. Hemoglobin at     discharge was 8.5, and patient should continue with EPO 10,000 units for     each dialysis and Venofer as above.                                                Beatrice Lecher, M.D.    CM/MEDQ  D:  03/26/2003  T:  03/27/2003  Job:  [id number]

## 2010-09-03 NOTE — Assessment & Plan Note (Signed)
[facility] OFFICE NOTE   DONAT, GAMLIN                        MRN:          [id number]  DATE:05/09/2006                            DOB:          1965-12-03    Mr. Leisher is a very pleasant gentleman.  He is 45 years of age.  He is  markedly overweight.  He is on dialysis for end-stage renal disease.  He  has no documented coronary disease.  In the past he has had some chest  discomfort.  We did a 2-D echocardiogram and it was technically  difficult but his ejection fraction appeared to be in the 50-55% range.  More recently, he has had some difficulty with borderline low blood  pressure at dialysis.  Also, he did have a syncopal episode.  This  occurred when standing up after dialysis.  Later in the day he had some  vague chest discomfort.  He also had some headaches.  Historically, he  has had this type of pattern of some dizziness with standing.  He also  has had some chest pain over the years.  There is no nausea, vomiting or  diaphoresis.  There is no significant radiation.   He also mentions that he has had some persistent increase in heart rate.   With his recent event, he was actually monitored overnight in the  hospital.  There was no evidence of an MI.  Chest x-ray was normal.  TSH  was normal.  He stabilized without difficulty and he was discharged  home.  His hemoglobin was 14.8.   PAST MEDICAL HISTORY:  Allergies:  No known drug allergies.   Medication:  Aspirin 325, Renagel, and Sensipar.   Other medical problems:  See the complete list below.   SOCIAL HISTORY:  The patient lives with his wife.  He lives at home.  He  does not smoke.   FAMILY HISTORY:  The family history is not remarkable for coronary  disease.   REVIEW OF SYSTEMS:  The patient's problems are related to the HPI.  Otherwise, his review of systems is negative.   PHYSICAL EXAMINATION:  The patient is markedly  overweight.  He weighs  383 pounds.  Blood pressure is 118/82.  His pulse appears to vary from  90 up to 110 beats per minute.  He is oriented to person, time and  place.  Affect is normal.  His wife and daughter are in the room at the  time of the evaluation.  There is no jugular venous distention.  There  are no carotid bruits.  Cardiac exam reveals an S1 with an S2.  I do not  hear any significant murmurs.  His lungs are clear.  There is no  respiratory distress.  The patient's weight is very much in his mid to  lower body.  His upper chest is not obese.  Overall, however, he is  markedly overweight.  His abdomen is obese.  Extremities are obese but  there is no significant peripheral edema.   EKG reveals sinus tachycardia, nonspecific ST-T wave changes.  Chest x-  ray done on April 29, 2006, revealed no marked abnormality.  BUN was  41 and creatinine 9.9.  TSH was 0.74.  His troponin was normal.   PROBLEMS:  1. Morbid obesity.  2. End-stage renal disease on dialysis.  3. Mild sinus tachycardia. We need to try to repeat an echocardiogram      again to see if we can reassess his LV function.  Then we need to      decide if a beta blocker should be used.  With his problems with      hypotension, I am hesitant to do this.  However, I would like to      try to slow his heart rate down if we can.  4. Syncope.  I suspect that this is positional and it occurs after      dialysis.  We will consider a monitor but I will not be placing it      at this time.  5. Orthostatic hypotension.  6. Chest discomfort.  We will first look at his LV function by      echocardiogram.   Trying to do any type of exercise testing will be problematic at his  weight.  After we know more about his LV function and the quality of his  echocardiogram, I will decide how to proceed.     Carlena Bjornstad, MD, [facility]  Electronically Signed    JDK/MedQ  DD: 05/09/2006  DT: 05/09/2006  Job #: [id number]   cc:    Alison Murray, M.D.

## 2010-09-03 NOTE — Op Note (Signed)
[name] [name], [name] [name]                           ACCOUNT NO.:  [id number]   MEDICAL RECORD NO.:  [id number]                   PATIENT TYPE:  INP   LOCATION:  5511                                 FACILITY:  [facility]   PHYSICIAN:  Jessy Oto. Fields, MD               DATE OF BIRTH:  09-06-1965   DATE OF PROCEDURE:  03/06/2003  DATE OF DISCHARGE:                                 OPERATIVE REPORT   PREOPERATIVE DIAGNOSIS:  Rupture pseudoaneurysm of left upper arm AV graft.   POSTOPERATIVE DIAGNOSIS:  Rupture pseudoaneurysm of left upper arm AV graft.   ANESTHESIA:  Local with IV sedation.   ASSISTANT:  Jacinta Shoe, P.A.   FINDINGS:  1. Pseudoaneurysm of left upper arm AV graft. No gross evidence of     infection.  2. Portion of graft sent for culture.   INDICATIONS FOR PROCEDURE:  The patient is a 45 year old male who is noted  to have a large amount of hemorrhage from a left upper arm graft earlier  today. This caused hypertension briefly. The patient was transferred from  [facility] for further care.   DESCRIPTION OF PROCEDURE:  After obtaining informed consent, the patient was  taken to the operating room. The patient was placed in supine position on  the operating table. Next, the patient's entire left upper extremity was  prepped and draped in the usual sterile fashion. Local anesthesia was  infiltrated over the distal most limb of the graft. An incision was made in  this area and carried down to the level of the graft and graft was dissected  free circumferentially. This was the arterial end of the graft. This portion  was then clamped and divided. The portion of the graft was then over sewn  with a running 5-0 Prolene suture. Next local anesthesia was infiltrated at  the upper portion of the venous limb. A similar incision was made and the  graft was dissected free circumferentially. This was also transected and the  venous limb portion oversewn with a running 5-0  Prolene suture. The graft  was found to be well incorporated in both of these incisions. There was no  obvious gross infection. The graft was dissected free up towards the area of  the pseudoaneurysm and then tucked into the subcutaneous tissues. Both of  these wounds were then thoroughly irrigated with normal saline solution and  then reapproximated with interrupted nylon sutures. After these incisions  had been completely closed and isolated from the area of the pseudoaneurysm,  the area of the pseudoaneurysm was then infiltrated with local anesthesia  and opened. The graft was then dissected free circumferentially in the  center of this wound. There was no obvious gross purulent material. However  cultures were sent and a portion of the graft was sent for culture as well.  This center portion of the graft was completely removed from the  subcutaneous tissues. Of note, it was very well incorporated. After this was  removed, the wound was thoroughly irrigated with normal saline solution.  This center portion of the wound was then left open and packed with saline  gauze. The patient tolerated the procedure well and there were no  complications. The patient had a palpable radial pulse at the beginning of  the case and this was still present at the end of the case.                                               Jessy Oto. Fields, MD   CEF/MEDQ  D:  03/06/2003  T:  03/07/2003  Job:  [id number]

## 2010-09-03 NOTE — Op Note (Signed)
[name], [name] [name]                           ACCOUNT NO.:  [id number]   MEDICAL RECORD NO.:  [id number]                   PATIENT TYPE:  OIB   LOCATION:  [id number]                                 FACILITY:  [facility]   PHYSICIAN:  Judeth Cornfield. Scot Dock, M.D.        DATE OF BIRTH:  [birth year]   DATE OF PROCEDURE:  07/18/2002  DATE OF DISCHARGE:  07/18/2002                                 OPERATIVE REPORT   PREOPERATIVE DIAGNOSIS:  Clotted left forearm arteriovenous graft.   POSTOPERATIVE DIAGNOSIS:  Clotted left forearm arteriovenous graft.   PROCEDURES:  1. Thrombectomy of left forearm arteriovenous graft.  2. Intraoperative fistulogram x2.  3. Revision of arterial end of graft with an interposition 6 mm     polytetrafluoroethylene graft.   SURGEON:  Judeth Cornfield. Scot Dock, M.D.   ASSISTANT:  Sheliah Hatch, P.A.   ANESTHESIA:  Local with sedation.   INDICATIONS:  This is a 45 year old gentleman who had a left IJ Diatek  catheter placed and a left forearm AV graft placed on 06/07/02.  He presented  with a clotted graft.  Of note, the patient states he has had some problems  with hypotension.   DESCRIPTION OF PROCEDURE:  The patient was taken to the operating room and  received sedation.  The left upper extremity was prepped and draped in the  usual sterile fashion.  After the skin was infiltrated with 1% lidocaine, a  transverse incision at the antecubital level was opened and the arterial and  venous limbs of the graft were dissected free.  The patient was heparinized  with 10,000 units of IV heparin.  The venous limb of the graft was divided  and then graft thrombectomy achieved using a #4 Fogarty catheter.  The  arterial plug was retrieved and excellent inflow established.  The graft was  flushed with heparinized saline and clamped.  Venous thrombectomy was then  performed, and there were no problems here.  The redundant segment of the  graft was excised.  The graft was then sewn  back end-to-end using continuous  6-0 Prolene suture.  I shot two intraoperative fistulograms.  The arterial  anastomosis was not well-visualized.  Otherwise, no problems were  identified.  However, the thrill in the graft was weak and I therefore  decided to explore the arterial end.  There was some retained clot here,  which I retrieved, but the thrill was still weak even after we had re-  established flow; therefore, I decided to replace the arterial end with an  interposition 6 mm PTFE graft.  The brachial artery proximally and distally  to the previous anastomosis was dissected out and then clamped.  The old  anastomosis was taken down and then a 6 mm PTFE graft was slightly  spatulated and sewn end-to-side to the brachial artery using continuous 6-0  Prolene suture.  It was then spatulated and ___________ to the old graft  using continuous  6-0 Prolene suture.  The thrill was improved after this.  There was still an excellent radial signal with the Doppler.  Hemostasis was  obtained in the wounds.  The wounds  were closed with a deep layer of 3-0 Vicryl, the skin closed with 4-0  Vicryl.  A sterile dressing was applied.  The patient tolerated the  procedure well, was transferred to the recovery room in satisfactory  condition.  All needle and sponge counts were correct.                                               Judeth Cornfield. Scot Dock, M.D.    CSD/MEDQ  D:  07/18/2002  T:  07/19/2002  Job:  [id number]

## 2010-09-03 NOTE — Op Note (Signed)
[name] [name], [name] [name]                           ACCOUNT NO.:  [id number]   MEDICAL RECORD NO.:  [id number]                   PATIENT TYPE:  INP   LOCATION:  5511                                 FACILITY:  [facility]   PHYSICIAN:  Rosetta Posner, M.D.                 DATE OF BIRTH:  [birth year]   DATE OF PROCEDURE:  03/10/2003  DATE OF DISCHARGE:                                 OPERATIVE REPORT   PREOPERATIVE DIAGNOSIS:  End-stage renal disease.   POSTOPERATIVE DIAGNOSIS:  End-stage renal disease.   PROCEDURE:  1. Creation of right upper  arm arteriovenous fistula.  2. Placement  of left IJ Diatek catheter.   SURGEON:  Rosetta Posner, M.D.   ASSISTANT:  Leta Baptist, P.A.-C.   ANESTHESIA:  MAC.   COMPLICATIONS:  None, disposition to the recovery room stable.   DESCRIPTION OF PROCEDURE:  The patient was brought to the operating room and  placed in the supine position where  the area of the right arm was prepped  and draped in the usual sterile fashion. Using local anesthesia an incision  was made across the antecubital space and carried down to isolate the  brachial artery which was of good caliber and the cephalic vein which was  also  of good caliber.   The vein was mobilized. The tributary branches were ligated and divided. The  vein was ligated distally and divided and was gently dilated with  heparinized saline and felt to be adequate for fistula creation. The vein  was brought into appropriate length with the brachial artery.   The brachial artery was occluded proximally  and distally and was opened  with a #11 blade and extended with the Potts scissors. The vein was brought  into approximation and was sewn end-to-side with the artery with a running 6-  0 Prolene suture. Clamps were removed and an excellent thrill was noted. The  wounds were irrigated with saline. Hemostasis was achieved with  electrocautery. The wounds were closed with 3-0 Vicryl in the subcutaneous  and  subcuticular tissue. Benzoin and Steri-Strips were applied.   Next  the patient was reprepped and draped and the right and left neck  were  prepped and draped in the usual sterile fashion. The patient was placed in  the Trendelenburg position. The patient had an indwelling Flexicon catheter.   The guide wire was passed down the Flexicon catheter and the Flexicon  catheter was removed in its entirety. The patient had tortuous anatomy and  therefore  an endhole catheter was positioned over the J-wire and the J-  guide wire was removed and an Amplatz super stiff guide wire was passed and  this was positioned down in the level of the inferior vena cava.   The endhole catheter was removed and a 32-cm Diatek catheter was passed over  the guide wire and positioned in the level of the  right atrium. The guide  wire was removed and the catheter was brought through the subcutaneous  tunnel and 2 lumen ports were attached. Both lumens flushed and aspirated  easily and were locked with 1,000 units per mL of heparin. The catheter was  secured to the skin with 3-0 nylon suture and the entry site was closed with  a 4-0 subcuticular Vicryl stitch. A sterile dressing was applied.   The patient was taken to the recovery room in stable condition.                                               Rosetta Posner, M.D.    TFE/MEDQ  D:  03/10/2003  T:  03/10/2003  Job:  [id number]

## 2010-09-03 NOTE — H&P (Signed)
NAMEDAJION, BRIAR                           ACCOUNT NO.:  [id number]   MEDICAL RECORD NO.:  [id number]                   PATIENT TYPE:  INP   LOCATION:  5511                                 FACILITY:  [facility]   PHYSICIAN:  James L. Deterding, M.D.            DATE OF BIRTH:  [birth year]   DATE OF ADMISSION:  03/06/2003  DATE OF DISCHARGE:                                HISTORY & PHYSICAL   CHIEF COMPLAINT:  Bleeding graft.   HISTORY OF PRESENT ILLNESS:  The patient is a 45 year old African-American  male with morbid obesity and end-stage renal disease secondary to  hypertension who comes in complaining of a boil-like area over his graft  that he has had for the past two weeks.  The patient says that he has been  using antibiotic ointment and towels.  He did notice some bloody  questionable pus-like drainage over the area.  During the past week, the  area has actually scabbed over.  He did see Dr.  Lowanda Foster last week and was  assessed and was told to continue his current care.  This a.m. at around  4:50, the patient said that the area on his arm actually burst, and blood  came shooting out from the site.  The patient's wife called EMS and they  tried to place pressure over the area.  EMS arrived and placed a pressure  bandage, and the patient was taken to [facility] ED.  Apparently while he was  at the ED awaiting to hear if he could be transferred here to [facility] on  the renal service, he had a tonic-clonic seizure.  The patient denies any  history of prior seizures and denies any recent fevers.   PAST MEDICAL HISTORY:  1. End-stage renal disease secondary to hypertension.  The patient started     dialysis February 2004.  He does have an A-V graft in the left upper arm     placed on Aug 29, 2002, by Dr. Scot Dock.  He dialyzes in [facility] Monday,     Wednesday, and Friday.  2. Hypertension, now off medications.  3. Morbid obesity.  4. Chronic back pain.  5. History of hernia repair.  6. Obstructive sleep apnea.   REVIEW OF SYSTEMS:  GENERAL:  Denies any fevers or recent weight change  except for weight taken off at dialysis.  CARDIAC:  No chest pain, no  palpitations.  PULMONARY:  Some intermittent shortness of breath this  morning.  GI:  No nausea, vomiting, diarrhea.  GU:  No burning or  discomfort.  NEUROLOGIC: Occasional headaches after dialysis.   MEDICATIONS:  1. Nephro-Vite 1 p.o. daily.  2. Renagel 3 with each meal.  3. Celebrex for back pain p.r.n.   SOCIAL HISTORY:  The patient lives in [place] with his wife, his  daughter, and step son.  The patient denies any current tobacco use, though  in the past he  says he did snuff.  The patient does not drink alcohol or do  other drugs.   FAMILY HISTORY:  Hypertension, diabetes, and congestive heart failure.  No  one in his family has had to go on dialysis.   ALLERGIES:  No known drug allergies.   PHYSICAL EXAMINATION:  VITAL SIGNS:  Temperature 97.9, pulse 127,  respirations 20, blood pressure 95/74.  GENERAL:  No acute distress, pleasant, alert and oriented x 4, lying flat on  the bed.  HEENT:  Head is atraumatic and normocephalic.  Extraocular movements are  intact.  Pupils equal, round, and reactive to light and accommodating.  Oropharynx is clear.  CARDIAC:  Rapid rate with a soft S1 and S2 secondary to body size.  No  murmurs detected.  Radial pulses 2+ bilaterally.  Dorsalis pedis pulses 1+  bilaterally.  PULMONARY:  Clear to auscultation.  ABDOMEN:  Soft, nontender, nondistended.  Normal bowel sounds.  No  detectable organomegaly.  An extremely obese abdomen.  EXTREMITIES:  No edema noted.  Dry, peeling skin on lower extremities, and  the left arm does have a scab area over the graft that is approximately 2.5  cm round.  Currently there is no area of bleeding.  Does have a large area  of bruise site over the graft area.   LABORATORY DATA:  White count 21.9, hemoglobin 10.9, platelets 303,000  with  89% neutrophils.  Sodium 139, potassium 3.9, chloride 102, bicarb 26, BUN  34, creatinine 9.9, glucose 150.  AST 17, ALT less than 8, total bilirubin  0.5, alkaline phosphatase 60, total protein 6, albumin 2.6, calcium 8.2,  phosphorus 3.4.  PTT 22, PT 13.8, INR 1.1.   ASSESSMENT/PLAN:  Mr. Choudhury is a 45 year old male with end-stage renal  disease secondary to hypertension who came in with arteriovenous graft  bleeding secondary to a scabbed wound that opened up over  the graft.   1. Infectious disease:  The patient may have a direct source for skin     inoculation of that graft.  The patient most likely organisms would be     Staphylococcus and Streptococcus.  Will start vancomycin and Fortaz and     await blood cultures.  The patient is currently hypotensive, most likely     from blood loss, but consider sepsis as a possibility since patient does     have increased white count, though the white count may also be a stress     reaction to the sudden volume loss.  2. End-stage renal disease:  The patient did have dialysis yesterday, and     currently his potassium is at a normal level.  The patient is not     currently volume overloaded, though we have given him plenty of fluids     and supportive fluids in the emergency department.  The patient also     received 2 units of blood in the operating room. Thus, eh may be volume     overloaded later today, and we will need to reassess.  He may benefit     from dialysis again today.  We will check a BMET after blood transfusion,     also follow potassium.  3. Hypotension secondary to volumen load.  We are currently resuscitating     patient as above.  A triple lumen central catheter was placed by Dr.     Jimmy Footman in the left IJ.  The patient will be transfused in the     operating  room and is currently on a dopamine drip.  The patient is being    taken immediately to the operating room by Dr. Oneida Alar to have ligation of     area of  bleeding of the graft and will also get a chest x-ray there to     make sure the line is in proper place.  Repeat potassium and a complete     blood count after transfusion.  4. Secondary hyperparathyroidism.  We will continue patient's Renagel when     he is able to take p.o.  Calcium and phosphorus appear to be at an     acceptable level.      Beatrice Lecher, M.D.                  James L. Deterding, M.D.    CM/MEDQ  D:  03/06/2003  T:  03/06/2003  Job:  [id number]

## 2010-09-03 NOTE — Assessment & Plan Note (Signed)
[place] OFFICE NOTE   [name] [name], [name] [name]                          MRN:          [id number]  DATE:05/31/2006                            DOB:          March 29, 1966    Mr. Mcgrue is actually doing well.  See my complete consult note of  May 09, 2006.  At that time, he was having some orthostatic  hypotension problems and he was having some chest discomfort.  He also  has resting sinus tachycardia.  We know that his TSH is normal.  We  decided to proceed with a 2D echo to see if we could assess his LV  function.  His upper chest was not overly obese and, in fact, adequate  images were obtained.  I read the study and I felt that there was mild  LVH and normal systolic function with an ejection fraction of 65%.   He returns today and he is feeling better, in general.  He has not had  any significant chest pain.  He appears to be tolerating his dialysis  better.   PAST MEDICAL HISTORY:   ALLERGIES:  No known drug allergies.   MEDICATION:  1. Aspirin 325.  2. Renagel.  3. Sensipar.   OTHER MEDICAL PROBLEMS:  See the list below.   REVIEW OF SYSTEMS:  He actually is doing well and, as of today, has no  major complaints.  Review of systems is negative.   PHYSICAL EXAM:  Weight today is down an additional 4 pounds to 379  pounds.  Blood pressure is 106/74, his pulse is 115.  The patient is  oriented to person, place and time and his affect is normal and he is  here with his wife.  HEENT:  Reveals no xanthelasma.  He has normal extraocular motion.  He  is morbidly obese.  LUNGS:  Clear.  Respiratory effort is not labored.  CARDIAC EXAM:  Reveals an S1 and an S2.  There are no clicks or  significant murmurs.  ABDOMEN:  His abdomen is obese.   The patient, as I mentioned, had a 2D echo with excellent result.   PROBLEMS INCLUDE:  1. Morbid obesity.  He continues to slowly lose weight.  I believe he  has lost possibly more than 100 pounds, over time.  2. End-stage renal disease, on dialysis.  3. Mild resting sinus tachycardia.  I do want to put him on a very low      dose of a beta blocker.  We will start with 12.5 mg of metoprolol      ER and increase it to 25.  4. History of syncope.  This will be followed.  5. Orthostatic hypotension.  This is the most likely cause of his      syncope historically.  6. Chest discomfort.  This is stable at this time and we will not work      it up further at this point.   He is doing well and I will see him back in six weeks.     Carlena Bjornstad,  MD, [facility]  Electronically Signed    JDK/MedQ  DD: 05/31/2006  DT: 05/31/2006  Job #: [id number]   cc:   Alison Murray, M.D.

## 2010-09-08 ENCOUNTER — Other Ambulatory Visit (HOSPITAL_COMMUNITY): Payer: Self-pay | Admitting: Nephrology

## 2010-09-08 DIAGNOSIS — N186 End stage renal disease: Secondary | ICD-10-CM

## 2010-09-16 ENCOUNTER — Ambulatory Visit (HOSPITAL_COMMUNITY): Admission: RE | Admit: 2010-09-16 | Payer: Medicare Other | Source: Ambulatory Visit

## 2010-09-20 ENCOUNTER — Other Ambulatory Visit (HOSPITAL_COMMUNITY): Payer: Self-pay | Admitting: Nephrology

## 2010-09-20 ENCOUNTER — Ambulatory Visit (HOSPITAL_COMMUNITY)
Admission: RE | Admit: 2010-09-20 | Discharge: 2010-09-20 | Disposition: A | Discharge status: Home / Self Care | Payer: Medicare Other | Source: Ambulatory Visit | Attending: Nephrology | Admitting: Nephrology

## 2010-09-20 DIAGNOSIS — N2581 Secondary hyperparathyroidism of renal origin: Secondary | ICD-10-CM | POA: Insufficient documentation

## 2010-09-20 DIAGNOSIS — E669 Obesity, unspecified: Secondary | ICD-10-CM | POA: Insufficient documentation

## 2010-09-20 DIAGNOSIS — E785 Hyperlipidemia, unspecified: Secondary | ICD-10-CM | POA: Insufficient documentation

## 2010-09-20 DIAGNOSIS — N186 End stage renal disease: Secondary | ICD-10-CM

## 2010-09-20 DIAGNOSIS — G4730 Sleep apnea, unspecified: Secondary | ICD-10-CM | POA: Insufficient documentation

## 2010-09-20 DIAGNOSIS — D649 Anemia, unspecified: Secondary | ICD-10-CM | POA: Insufficient documentation

## 2010-09-20 DIAGNOSIS — T82898A Other specified complication of vascular prosthetic devices, implants and grafts, initial encounter: Secondary | ICD-10-CM | POA: Insufficient documentation

## 2010-09-20 DIAGNOSIS — Y832 Surgical operation with anastomosis, bypass or graft as the cause of abnormal reaction of the patient, or of later complication, without mention of misadventure at the time of the procedure: Secondary | ICD-10-CM | POA: Insufficient documentation

## 2010-09-20 MED ORDER — IOHEXOL 300 MG/ML  SOLN
100.0000 mL | Freq: Once | INTRAMUSCULAR | Status: AC | PRN
  Administered 2010-09-20: 50 mL via INTRAVENOUS

## 2010-09-27 ENCOUNTER — Ambulatory Visit (HOSPITAL_COMMUNITY)
Admission: RE | Admit: 2010-09-27 | Discharge: 2010-09-27 | Disposition: A | Discharge status: Home / Self Care | Payer: Medicare Other | Source: Ambulatory Visit | Attending: Vascular Surgery | Admitting: Vascular Surgery

## 2010-09-27 DIAGNOSIS — Y832 Surgical operation with anastomosis, bypass or graft as the cause of abnormal reaction of the patient, or of later complication, without mention of misadventure at the time of the procedure: Secondary | ICD-10-CM | POA: Insufficient documentation

## 2010-09-27 DIAGNOSIS — N186 End stage renal disease: Secondary | ICD-10-CM

## 2010-09-27 DIAGNOSIS — I120 Hypertensive chronic kidney disease with stage 5 chronic kidney disease or end stage renal disease: Secondary | ICD-10-CM

## 2010-09-27 DIAGNOSIS — T82898A Other specified complication of vascular prosthetic devices, implants and grafts, initial encounter: Secondary | ICD-10-CM

## 2010-09-27 DIAGNOSIS — Z01812 Encounter for preprocedural laboratory examination: Secondary | ICD-10-CM | POA: Insufficient documentation

## 2010-09-27 DIAGNOSIS — I509 Heart failure, unspecified: Secondary | ICD-10-CM | POA: Insufficient documentation

## 2010-09-27 DIAGNOSIS — E669 Obesity, unspecified: Secondary | ICD-10-CM | POA: Insufficient documentation

## 2010-09-27 LAB — SURGICAL PCR SCREEN: MRSA, PCR: NEGATIVE

## 2010-10-05 NOTE — Op Note (Signed)
  NAMEREICHEN, JUAIRE NO.:  [id number]  MEDICAL RECORD NO.:  [id number]  LOCATION:  SDSC                         FACILITY:  [facility]  PHYSICIAN:  Rosetta Posner, M.D.    DATE OF BIRTH:  03/05/1966  DATE OF PROCEDURE:  09/27/2010 DATE OF DISCHARGE:  09/27/2010                              OPERATIVE REPORT   PREOPERATIVE DIAGNOSIS:  End-stage renal disease with occluded right forearm loop arteriovenous Gore-Tex graft.  POSTOPERATIVE DIAGNOSIS:  End-stage renal disease with occluded right forearm loop arteriovenous Gore-Tex graft.  PROCEDURE:  Thrombectomy, interposition jump graft, revision to higher basilic vein of right forearm loop AV Gore-Tex graft.  SURGEON:  Rosetta Posner, MD  ASSISTANT:  Leta Baptist, PA-C  ANESTHESIA:  MAC.  COMPLICATIONS:  None.  COMPLICATIONS:  None.  DISPOSITION:  To recovery room in stable.  INDICATIONS FOR PROCEDURE:  The patient is a 45 year old gentleman with end-stage renal disease who had recent occlusion of his right forearm loop graft.  He underwent thrombolysis and angioplasty in Interventional Radiology.  On September 20, 2010, he had an early reocclusion, was taken to the operating room at this time for surgical revision.  PROCEDURE IN DETAIL:  The patient was taken to the operating room and placed in supine position where the area of the right arm and right axilla were prepped and draped in usual sterile fashion.  Using local anesthesia, incision was made over the venous anastomosis to the basilic vein.  The shuntogram from week ago showed that there was a stenosis at this level.  The graft itself was exposed, encircled with a vessel loop. The basilic vein distally was also exposed distal to the level of the sclerosis.  The vein was of good caliber at this position.  The graft was opened near the venous anastomosis.  The graft itself was thrombectomized.  The arterialized plug was removed and excellent flow was  encountered.  This was flushed with heparinized saline and re- occluded.  The basilic vein was then exposed proximal to the area of stenosis.  The sclerotic area of the vein was excised and the basilic vein above this was widely patent.  An 8-mm interposition graft of [place] was brought onto the field, was spatulated and sewn end-to-end of the basilic vein with a running 6-0 Prolene suture and then end to the old graft with a running 6- 0 Prolene suture as well.  Clamps were removed and excellent thrill was noted.  The wounds were irrigated with saline.  Hemostasis with electrocautery.  The wounds were closed with 3-0 Vicryl in the subcutaneous and subcuticular tissue.  Benzoin and Steri-Strips were applied.     Rosetta Posner, M.D.     TFE/MEDQ  D:  09/27/2010  T:  09/28/2010  Job:  [id number]  Electronically Signed by TODD EARLY M.D. on 10/05/2010 01:11:41 PM

## 2010-10-08 ENCOUNTER — Ambulatory Visit (HOSPITAL_COMMUNITY)
Admission: AD | Admit: 2010-10-08 | Discharge: 2010-10-08 | Disposition: A | Discharge status: Home / Self Care | Payer: Medicare Other | Source: Ambulatory Visit | Attending: Vascular Surgery | Admitting: Vascular Surgery

## 2010-10-08 ENCOUNTER — Encounter: Payer: Self-pay | Admitting: *Deleted

## 2010-10-08 ENCOUNTER — Ambulatory Visit (HOSPITAL_COMMUNITY): Payer: Medicare Other

## 2010-10-08 DIAGNOSIS — I120 Hypertensive chronic kidney disease with stage 5 chronic kidney disease or end stage renal disease: Secondary | ICD-10-CM

## 2010-10-08 DIAGNOSIS — N186 End stage renal disease: Secondary | ICD-10-CM

## 2010-10-08 DIAGNOSIS — Z01810 Encounter for preprocedural cardiovascular examination: Secondary | ICD-10-CM | POA: Insufficient documentation

## 2010-10-08 DIAGNOSIS — Z01812 Encounter for preprocedural laboratory examination: Secondary | ICD-10-CM | POA: Insufficient documentation

## 2010-10-08 LAB — POCT I-STAT 4, (NA,K, GLUC, HGB,HCT)
HCT: 46 % (ref 39.0–52.0)
Hemoglobin: 15.6 g/dL (ref 13.0–17.0)
Potassium: 4.5 mEq/L (ref 3.5–5.1)
Sodium: 139 mEq/L (ref 135–145)

## 2010-10-10 NOTE — Op Note (Signed)
  [name] [name], [name] [name]                 ACCOUNT NO.:  [id number]  MEDICAL RECORD NO.:  [id number]  LOCATION:  SDSC                         FACILITY:  [facility]  PHYSICIAN:  Jessy Oto. Fields, MD  DATE OF BIRTH:  October 08, 1965  DATE OF PROCEDURE:  10/08/2010 DATE OF DISCHARGE:  10/08/2010                              OPERATIVE REPORT   PROCEDURE: 1. Ultrasound of neck. 2. Left internal jugular vein Diatek catheter.  PREOPERATIVE DIAGNOSIS:  End-stage renal disease.  POSTOPERATIVE DIAGNOSIS:  End-stage renal disease.  ANESTHESIA:  Local with IV sedation.  OPERATIVE FINDINGS:  A 23-cm catheter in left internal jugular vein.  OPERATIVE DETAILS:  After obtaining informed consent, the patient was taken to the operating room.  The patient was placed in supine position on the operating room table.  After adequate sedation, the patient's entire neck and chest were prepped and draped in usual sterile fashion. Next, ultrasound was used to identify the left and right internal jugular veins.  Both jugular veins were compressible with normal respiratory variation.  However, the right internal jugular vein was quite small compared to the left.  The left internal jugular vein was selected for placement of the catheter.  Local anesthesia was infiltrated at the base of the left neck.  Using ultrasound guidance, the left internal jugular vein was cannulated on a second pass, and a 0.035 J-tip guide wire threaded into the left internal jugular vein across the innominate vein and down into the right atrium and inferior vena cava under fluoroscopic guidance.  Next, sequential 12, 14, 16- French dilators with peel-away sheath placed over the guidewire in the right atrium.  The dilator was removed, and a 23-cm Diatek catheter was placed over the guidewire using a weave technique in the right atrial atrium.  The guidewire was then removed as well as the peel-away sheath. Catheter was then tunneled  subcutaneously, cut to length and hub attached.  Catheter was noted to flush and draw easily.  Catheter sutured to skin with nylon sutures, and the neck insertion site was closed with Vicryl stitch.  Catheter was then loaded with concentrated heparin solution.  The patient was evaluated under fluoroscopy, found the tip of the catheter to be in the right atrium.  No kinks throughout its course.  The patient tolerated the procedure well.  There were no complications.  Instrument, sponge, and needle counts were correct at the end of the case.  The patient was taken to the recovery room in stable condition.    Jessy Oto. Fields, MD    CEF/MEDQ  D:  10/08/2010  T:  10/09/2010  Job:  [id number]  Electronically Signed by Ruta Hinds MD on 10/10/2010 12:36:31 PM

## 2010-10-18 ENCOUNTER — Encounter: Payer: Self-pay | Admitting: Cardiology

## 2010-10-18 DIAGNOSIS — R0789 Other chest pain: Secondary | ICD-10-CM | POA: Insufficient documentation

## 2010-10-18 DIAGNOSIS — I951 Orthostatic hypotension: Secondary | ICD-10-CM | POA: Insufficient documentation

## 2010-10-18 DIAGNOSIS — N186 End stage renal disease: Secondary | ICD-10-CM | POA: Insufficient documentation

## 2010-10-18 DIAGNOSIS — I729 Aneurysm of unspecified site: Secondary | ICD-10-CM | POA: Insufficient documentation

## 2010-10-18 DIAGNOSIS — R55 Syncope and collapse: Secondary | ICD-10-CM | POA: Insufficient documentation

## 2010-10-19 ENCOUNTER — Telehealth: Payer: Self-pay | Admitting: *Deleted

## 2010-10-19 ENCOUNTER — Ambulatory Visit (INDEPENDENT_AMBULATORY_CARE_PROVIDER_SITE_OTHER): Payer: Medicare Other | Admitting: Cardiology

## 2010-10-19 ENCOUNTER — Other Ambulatory Visit: Payer: Self-pay | Admitting: Cardiology

## 2010-10-19 ENCOUNTER — Encounter: Payer: Self-pay | Admitting: *Deleted

## 2010-10-19 ENCOUNTER — Encounter: Payer: Self-pay | Admitting: Cardiology

## 2010-10-19 DIAGNOSIS — I498 Other specified cardiac arrhythmias: Secondary | ICD-10-CM

## 2010-10-19 DIAGNOSIS — R000 Tachycardia, unspecified: Secondary | ICD-10-CM

## 2010-10-19 DIAGNOSIS — N186 End stage renal disease: Secondary | ICD-10-CM

## 2010-10-19 DIAGNOSIS — I951 Orthostatic hypotension: Secondary | ICD-10-CM

## 2010-10-19 DIAGNOSIS — R55 Syncope and collapse: Secondary | ICD-10-CM

## 2010-10-19 DIAGNOSIS — R0789 Other chest pain: Secondary | ICD-10-CM

## 2010-10-19 NOTE — Assessment & Plan Note (Signed)
There has been no recurrent syncope.  No further workup.

## 2010-10-19 NOTE — Progress Notes (Signed)
HPI Patient is seen to reestablish cardiology care and to reassess sinus tachycardia.  Patient has end-stage renal disease.  He is having multiple procedures to shunts in his upper right arm and lower right arm.  Most recently all of these have failed and he has a new subclavian temporary shunt in his left subclavian.  It has been noted that he has sinus tachycardia.  He's had sinus tachycardia historically.  When I saw him in 2008 there was no obvious etiology.  He had normal left ventricular function at that time.  TSH was normal.  Is not having any signs of this chest pain or heart failure.  He did not feel palpitations.  It is quite interesting that since his right arm shunts have all occluded and he has a subclavian catheter that his heart rate today is normal.  For evaluation today I have reviewed records in our charts back to 2008.  Also reviewed records from [facility] concerning procedures that he has had with his right arm shunts.  .No Known Allergies  Current Outpatient Prescriptions  Medication Sig Dispense Refill  . aspirin 81 MG tablet Take 81 mg by mouth daily.        . B Complex-C-Folic Acid (NEPHRO-VITE PO) Take 1 tablet by mouth daily.        . cinacalcet (SENSIPAR) 30 MG tablet Take 30 mg by mouth daily.        [name] FOSRENOL 1000 MG chewable tablet Chew 3 tablets by mouth Three times a day.      . nitroGLYCERIN (NITROSTAT) 0.4 MG SL tablet Place 0.4 mg under the tongue every 5 (five) minutes as needed.        [name] DISCONTD: isosorbide mononitrate (IMDUR) 30 MG 24 hr tablet Take 30 mg by mouth daily.        [name] DISCONTD: sevelamer (RENAGEL) 800 MG tablet Take 800 mg by mouth 3 (three) times daily with meals.          History   Social History  . Marital Status: Married    Spouse Name: N/A    Number of Children: N/A  . Years of Education: N/A   Occupational History  . Not on file.   Social History Main Topics  . Smoking status: Never Smoker   . Smokeless tobacco: Current  User    Types: Snuff   Comment: dips 1/2 snuff per day times 25 years  . Alcohol Use: No  . Drug Use: No  . Sexually Active: Not on file   Other Topics Concern  . Not on file   Social History Narrative   Single    Family History  Problem Relation Age of Onset  . Hypertension Mother   . Hypertension Father     Past Medical History  Diagnosis Date  . ESRD (end stage renal disease)     on hemodialysis  . Hypertension   . Orthostatic hypotension      2008  . Chest discomfort      2008  . Sinus tachycardia      2008, TSH normal  . Ejection fraction      65%, echo, 2008  . Overweight   . Syncope      positional after dialysis... 2008  . Aneurysm      Right arm fistula 3 aneurysms 2011,   plans to have a new procedure    Past Surgical History  Procedure Date  . Right arm graft     for dyalisis  .  Umbilical hernia repair     ROS  Patient denies fever, chills, headache, sweats, rash, change in vision, change in hearing, chest pain, cough, nausea vomiting, urinary symptoms.  All other systems are reviewed and are negative. PHYSICAL EXAM Patient is here with his wife today.  He is oriented to person time and place.  Affect is normal.  Head is atraumatic.  There is no xanthelasma.  He is overweight.  He has old clotted shunts in his right upper arm and right lower arm.  There is a temporary catheter in his left subclavian.  There is no jugular venous distention.  Lungs are clear cardiorespiratory effort is unlabored.  Cardiac exam reveals an S1-S2.  No clicks or significant murmurs.  The abdomen is soft.  There is no peripheral edema.  There are no musculoskeletal deformities.  No skin rashes. Filed Vitals:   10/19/10 0906  BP: 127/86  Pulse: 95  Height: 6' (1.829 m)  Weight: 357 lb (161.934 kg)  SpO2: 97%    EKG Is done today and reviewed by me.  There is normal sinus rhythm and no significant QRS changes.  ASSESSMENT & PLAN

## 2010-10-19 NOTE — Assessment & Plan Note (Signed)
He has not had any significant chest pain.  He does not need any type of exercise test at this point unless his LV function has changed.

## 2010-10-19 NOTE — Assessment & Plan Note (Signed)
He has mild positional dizziness at times but no marked findings.  No further workup.

## 2010-10-19 NOTE — Telephone Encounter (Signed)
2 D ECHO @ [facility] [id number] CHECKING PERCERT.

## 2010-10-19 NOTE — Assessment & Plan Note (Signed)
Currently he is undergoing dialysis through a temporary shunt.

## 2010-10-19 NOTE — Assessment & Plan Note (Signed)
Recently patient has had sinus tachycardia that has persisted.  His physicians were concerned.  I had seen him for this in 2003 and there was no obvious etiology.  Today his heart rate is controlled.  The heart rate is now controlled when the shunt in his right arm are clotted.  It is possible that this may play a role.  We do need to reassess his LV function to be sure that there is no underlying change.  Two-dimensional echo will be done.  Also he will have a TSH if it has not been done with his labs and his dialysis center.  I see him for followup to review.

## 2010-10-19 NOTE — Telephone Encounter (Signed)
AUTH # [id number] EXP 11/17/10

## 2010-10-19 NOTE — Patient Instructions (Signed)
Your physician has requested that you have an echocardiogram. Echocardiography is a painless test that uses sound waves to create images of your heart. It provides your doctor with information about the size and shape of your heart and how well your heart's chambers and valves are working. This procedure takes approximately one hour. There are no restrictions for this procedure. Will contact [facility] for lab results & notify if need other labs done for Dr. Ron Parker Follow up - see above for appointment.

## 2010-10-22 ENCOUNTER — Telehealth: Payer: Self-pay | Admitting: *Deleted

## 2010-10-22 DIAGNOSIS — E782 Mixed hyperlipidemia: Secondary | ICD-10-CM

## 2010-10-22 DIAGNOSIS — R000 Tachycardia, unspecified: Secondary | ICD-10-CM

## 2010-10-22 NOTE — Telephone Encounter (Signed)
Per office visit this week, pt needed FLP/TSH done if not done at dialysis. Pt had FLP done in March but no TSH has been done.  Attempted to reach pt to notify him he will need to have labs done. Left message at home and cell numbers.

## 2010-10-25 NOTE — Telephone Encounter (Signed)
Pt notified to have lab work done (fasting).

## 2010-10-26 ENCOUNTER — Ambulatory Visit (HOSPITAL_COMMUNITY)
Admission: RE | Admit: 2010-10-26 | Discharge: 2010-10-26 | Disposition: A | Discharge status: Home / Self Care | Payer: Medicare Other | Source: Ambulatory Visit | Attending: Surgery | Admitting: Surgery

## 2010-10-26 DIAGNOSIS — N186 End stage renal disease: Secondary | ICD-10-CM

## 2010-10-26 DIAGNOSIS — T82898A Other specified complication of vascular prosthetic devices, implants and grafts, initial encounter: Secondary | ICD-10-CM

## 2010-10-26 DIAGNOSIS — Z992 Dependence on renal dialysis: Secondary | ICD-10-CM | POA: Insufficient documentation

## 2010-10-26 LAB — POCT I-STAT, CHEM 8
Calcium, Ion: 0.95 mmol/L — ABNORMAL LOW (ref 1.12–1.32)
HCT: 43 % (ref 39.0–52.0)
Hemoglobin: 14.6 g/dL (ref 13.0–17.0)
TCO2: 26 mmol/L (ref 0–100)

## 2010-10-28 DIAGNOSIS — R079 Chest pain, unspecified: Secondary | ICD-10-CM

## 2010-10-31 NOTE — Op Note (Signed)
  [name], [name] [name]                 ACCOUNT NO.:  [id number]  MEDICAL RECORD NO.:  [id number]  LOCATION:  SDSC                         FACILITY:  [facility]  PHYSICIAN:  Theotis Burrow IV, MDDATE OF BIRTH:  May 28, 1965  DATE OF PROCEDURE:  10/26/2010 DATE OF DISCHARGE:                              OPERATIVE REPORT   PREOPERATIVE DIAGNOSIS:  End-stage renal disease.  POSTOPERATIVE DIAGNOSIS:  End-stage renal disease.  PROCEDURE PERFORMED:  Bilateral upper and central venogram.  INDICATIONS:  This is a 45 year old gentleman with end-stage renal disease.  He has had a right forearm graft and upper arm fistula as well as a left upper arm fistula and graft.  He comes in today having occluded his most recent access dialyzing through a left-sided catheter. This is for access management.  PROCEDURE:  Peripheral IVs were placed in the holding area and the patient was brought to room 8.  Contrast injections were performed through the IVs and images were reviewed.  FINDINGS:  The brachial vein was seen draining in the axillary vein. They appeared to be widely patent.  There was no evidence of central stenosis coming from the right.  On the left, the cephalic vein was seen draining into the axillary vein.  The innominate and superior vena cava appeared to be widely patent.  IMPRESSION: 1. No evidence of central venous stenosis. 2. The patient will be scheduled for a right upper arm graft in the     immediate future.     Eldridge Abrahams, MD     VWB/MEDQ  D:  10/26/2010  T:  10/26/2010  Job:  [id number]  Electronically Signed by Orvan Falconer IV MD on 10/31/2010 11:05:23 PM

## 2010-11-15 ENCOUNTER — Telehealth: Payer: Self-pay | Admitting: Cardiology

## 2010-11-15 NOTE — Telephone Encounter (Signed)
Faxed LOV, EKG & Echo. To [facility] at [facility] ([phone]).

## 2010-11-16 ENCOUNTER — Ambulatory Visit (HOSPITAL_COMMUNITY)
Admission: RE | Admit: 2010-11-16 | Discharge: 2010-11-16 | Disposition: A | Discharge status: Home / Self Care | Payer: Medicare Other | Source: Ambulatory Visit | Attending: Vascular Surgery | Admitting: Vascular Surgery

## 2010-11-16 ENCOUNTER — Encounter: Payer: Self-pay | Admitting: Cardiology

## 2010-11-16 DIAGNOSIS — E669 Obesity, unspecified: Secondary | ICD-10-CM | POA: Insufficient documentation

## 2010-11-16 DIAGNOSIS — I120 Hypertensive chronic kidney disease with stage 5 chronic kidney disease or end stage renal disease: Secondary | ICD-10-CM

## 2010-11-16 DIAGNOSIS — N186 End stage renal disease: Secondary | ICD-10-CM | POA: Insufficient documentation

## 2010-11-16 DIAGNOSIS — Z01812 Encounter for preprocedural laboratory examination: Secondary | ICD-10-CM | POA: Insufficient documentation

## 2010-11-16 DIAGNOSIS — Z992 Dependence on renal dialysis: Secondary | ICD-10-CM | POA: Insufficient documentation

## 2010-11-16 DIAGNOSIS — I509 Heart failure, unspecified: Secondary | ICD-10-CM | POA: Insufficient documentation

## 2010-11-16 DIAGNOSIS — F17200 Nicotine dependence, unspecified, uncomplicated: Secondary | ICD-10-CM | POA: Insufficient documentation

## 2010-11-16 LAB — POCT I-STAT 4, (NA,K, GLUC, HGB,HCT)
Glucose, Bld: 75 mg/dL (ref 70–99)
HCT: 45 % (ref 39.0–52.0)
Potassium: 4.5 mEq/L (ref 3.5–5.1)

## 2010-11-16 LAB — SURGICAL PCR SCREEN: MRSA, PCR: NEGATIVE

## 2010-11-17 NOTE — Op Note (Signed)
NAMEALLON, HELMING NO.:  [id number]  MEDICAL RECORD NO.:  [id number]  LOCATION:  [facility]                         FACILITY:  [facility]  PHYSICIAN:  Conrad [place], MD       DATE OF BIRTH:  Sep 10, 1965  DATE OF PROCEDURE:  11/16/2010 DATE OF DISCHARGE:  11/16/2010                              OPERATIVE REPORT   PROCEDURE:  Right upper arm arteriovenous graft.  PREOPERATIVE DIAGNOSIS:  End-stage renal disease, requiring hemodialysis.  POSTOPERATIVE DIAGNOSIS:  End-stage renal disease, requiring hemodialysis.  SURGEON:  Conrad [place], MD  ASSISTANT:  Leta Baptist, PA  ANESTHESIA:  General.  FINDINGS IN THIS CASE: 1. An atherosclerotic brachial artery. 2. Palpable right radial pulse at the end of the case. 3. Palpable thrill in the upper arm grafts.  SPECIMENS:  None.  ESTIMATED BLOOD LOSS:  Minimal.  INDICATIONS:  This is a 45 year old gentleman who has undergone previous right and left arm accesses which all have failed.  He is currently dialyzing through a left subclavian vein tunneled dialysis catheter. Dr. Trula Slade recently did a central and bilateral upper extremity venograms to determine which arm to place his next access.  Based on his imaging, he has adequate right axillary and subclavian vein anatomy for placement of a right upper arm graft.  Subsequently, he was consented for such.  He is aware of the risks of this procedure include bleeding, infection, possible ischemic neuropathy, possible steal syndrome, possible nerve damage, possible failure to mature, and possible need for additional procedures.  He is aware of these risks and agreed to proceed forward.  DESCRIPTION OF OPERATION:  After full informed written consent was obtained from the patient, he was brought back to the operating room, placed supine upon the operating table.  Prior to induction, he had received IV antibiotics.  After obtaining adequate anesthesia, he was prepped and  draped in standard fashion for right arm access procedure. I turned my attention first to the antecubitum and injected some local anesthetic.  In total throughout this case, I used about 30 mL of  an one-to-one mixture of 0.5% Marcaine without epinephrine and 1%  lidocaine with epinephrine, injected at this location and near the axilla  along the bicipital groove.  I made an incision through the previous incision  in the antecubitum and extended it slightly.  I dissected down to the brachial artery which noted to be quite large about 4-5 mm in diameter, and got controlled of it proximally and distally with vessel loops.  I then turned my attention to his axilla, made a longitudinal incision over the bicipital groove to expose the high brachial vein, as it was imaged on the venogram.  Using blunt dissection and electrocautery, I was eventually able to get down to the brachial vein which was quite large, approximately 8-9 mm in diameter at this level.  At this point, I gave the patient 3000 units of heparin and then tunneled from the antecubital incision to the axillary incision with a curvilinear metal tunneler, then obtained a 4 mm x 7 mm Gore-Tex graft which I stretched to full length and then passed it through the metal tunneler, and  removed the metal tunnel leaving the graft in place, with the 4 mm end toward the artery and 7-mm toward the vein.  I then placed the brachial artery under tension proximally and distally, and made an arteriotomy with a 11 blade, and extended with Potts scissor for about a 4.5 mm arteriotomy.  Note, it became quite evident immediately upon entering into this artery that this was quite a diseased atherosclerotic brachial artery.  There was extensive amount of plaque within it, however, I thought it was adherent that an endarterectomy was not necessary.  I spatulated the 4 mm end of this graft to fit the dimensions of this arteriotomy and then sewed the  graft to the artery in end-to-side configuration with running stitch of 6-0 Prolene.  Prior to completing this anastomosis, I did allow the artery to back bleed.  In the proximal and distally, there was excellent bleeding from both ends even though the interior of this artery appeared to be quite atherosclerotic.  I then placed a clamp on the graft near its arterial anastomosis and pulled the graft to appropriate tension in this tunnel and then I tied off the distal high brachial vein with a 2-0 silk, then I transected the vein.  I also at this point spatulated the 7 mm end of this graft slightly to better meet the dimensions of this vein. This vein and graft were sewn in end-to-end configuration with a running stitch of 5-0 Prolene.  Prior to completing this anastomosis, I allowed the vein to back bleed.  There was excellent venous backbleeding and then also allowed the graft to bleed in antegrade fashion.  There was good excellent pulsatile bleeding from the graft.  We clamped the graft, instilled heparinized saline  in to this anastomosis and completed anastomosis in the usual fashion.  Then, I released all clamps.  At this point, there was an excellent thrill within the vein and eventually along the course of this graft distally.  I still could feel a radial pulse.  This was interrogated with continuous Doppler, had a biphasic signal.  The brachial artery proximal and distal to anastomosis had multiphasic signals.  The vein outflow had excellent signal consistent widely patent arteriovenous graft.  The patient tolerated this procedure fine.  COMPLICATIONS:  None.  CONDITION:  Stable.     Conrad Parc, MD     BLC/MEDQ  D:  11/16/2010  T:  11/16/2010  Job:  [id number]  Electronically Signed by Adele Barthel MD on 11/17/2010 08:47:07 AM

## 2010-12-01 ENCOUNTER — Encounter: Payer: Self-pay | Admitting: *Deleted

## 2010-12-01 NOTE — Progress Notes (Signed)
  Pt mailed a certified letter asking for FLP/TSH to be completed. Labs were ordered in July and reminder letter was mailed July 17th, but lab work has not been completed at this time.

## 2010-12-08 ENCOUNTER — Ambulatory Visit: Payer: Medicare Other | Admitting: Cardiology

## 2010-12-23 ENCOUNTER — Encounter: Payer: Self-pay | Admitting: Cardiology

## 2010-12-23 DIAGNOSIS — IMO0002 Reserved for concepts with insufficient information to code with codable children: Secondary | ICD-10-CM | POA: Insufficient documentation

## 2010-12-23 DIAGNOSIS — R9430 Abnormal result of cardiovascular function study, unspecified: Secondary | ICD-10-CM | POA: Insufficient documentation

## 2010-12-24 ENCOUNTER — Ambulatory Visit: Payer: Medicare Other | Admitting: Cardiology

## 2010-12-24 ENCOUNTER — Encounter: Payer: Self-pay | Admitting: Cardiology

## 2011-01-07 ENCOUNTER — Other Ambulatory Visit (HOSPITAL_COMMUNITY): Payer: Self-pay | Admitting: Nephrology

## 2011-01-07 DIAGNOSIS — N186 End stage renal disease: Secondary | ICD-10-CM

## 2011-01-14 ENCOUNTER — Other Ambulatory Visit (HOSPITAL_COMMUNITY): Payer: Self-pay | Admitting: Nephrology

## 2011-01-14 ENCOUNTER — Ambulatory Visit (HOSPITAL_COMMUNITY)
Admission: RE | Admit: 2011-01-14 | Discharge: 2011-01-14 | Disposition: A | Discharge status: Home / Self Care | Payer: Medicare Other | Source: Ambulatory Visit | Attending: Nephrology | Admitting: Nephrology

## 2011-01-14 DIAGNOSIS — N2581 Secondary hyperparathyroidism of renal origin: Secondary | ICD-10-CM | POA: Insufficient documentation

## 2011-01-14 DIAGNOSIS — N186 End stage renal disease: Secondary | ICD-10-CM

## 2011-01-14 DIAGNOSIS — I509 Heart failure, unspecified: Secondary | ICD-10-CM | POA: Insufficient documentation

## 2011-01-14 DIAGNOSIS — T82898A Other specified complication of vascular prosthetic devices, implants and grafts, initial encounter: Secondary | ICD-10-CM | POA: Insufficient documentation

## 2011-01-14 DIAGNOSIS — Z5309 Procedure and treatment not carried out because of other contraindication: Secondary | ICD-10-CM | POA: Insufficient documentation

## 2011-01-14 DIAGNOSIS — G4730 Sleep apnea, unspecified: Secondary | ICD-10-CM | POA: Insufficient documentation

## 2011-01-14 DIAGNOSIS — D649 Anemia, unspecified: Secondary | ICD-10-CM | POA: Insufficient documentation

## 2011-01-14 DIAGNOSIS — Y831 Surgical operation with implant of artificial internal device as the cause of abnormal reaction of the patient, or of later complication, without mention of misadventure at the time of the procedure: Secondary | ICD-10-CM | POA: Insufficient documentation

## 2011-01-14 DIAGNOSIS — I2510 Atherosclerotic heart disease of native coronary artery without angina pectoris: Secondary | ICD-10-CM | POA: Insufficient documentation

## 2011-01-14 MED ORDER — IOHEXOL 300 MG/ML  SOLN
150.0000 mL | Freq: Once | INTRAMUSCULAR | Status: AC | PRN
  Administered 2011-01-14: 50 mL via INTRAVENOUS

## 2011-01-18 ENCOUNTER — Ambulatory Visit (HOSPITAL_COMMUNITY)
Admission: RE | Admit: 2011-01-18 | Discharge: 2011-01-18 | Disposition: A | Discharge status: Home / Self Care | Payer: Medicare Other | Source: Ambulatory Visit | Attending: Vascular Surgery | Admitting: Vascular Surgery

## 2011-01-18 DIAGNOSIS — N186 End stage renal disease: Secondary | ICD-10-CM | POA: Insufficient documentation

## 2011-01-18 DIAGNOSIS — T82898A Other specified complication of vascular prosthetic devices, implants and grafts, initial encounter: Secondary | ICD-10-CM

## 2011-01-18 DIAGNOSIS — I120 Hypertensive chronic kidney disease with stage 5 chronic kidney disease or end stage renal disease: Secondary | ICD-10-CM | POA: Insufficient documentation

## 2011-01-18 DIAGNOSIS — Y832 Surgical operation with anastomosis, bypass or graft as the cause of abnormal reaction of the patient, or of later complication, without mention of misadventure at the time of the procedure: Secondary | ICD-10-CM | POA: Insufficient documentation

## 2011-01-18 LAB — POCT I-STAT 4, (NA,K, GLUC, HGB,HCT)
HCT: 40 % (ref 39.0–52.0)
Hemoglobin: 13.6 g/dL (ref 13.0–17.0)
Potassium: 3.6 mEq/L (ref 3.5–5.1)
Sodium: 140 mEq/L (ref 135–145)

## 2011-01-19 NOTE — Op Note (Signed)
[name] [name], [name] [name]                 ACCOUNT NO.:  [id number]  MEDICAL RECORD NO.:  [id number]  LOCATION:  [facility]                         FACILITY:  [facility]  PHYSICIAN:  Jessy Oto. Fields, MD  DATE OF BIRTH:  Dec 06, 1965  DATE OF PROCEDURE:  01/18/2011 DATE OF DISCHARGE:  01/18/2011                              OPERATIVE REPORT   PROCEDURE:  Thrombectomy and revision, right upper arm arteriovenous graft.  PREOPERATIVE DIAGNOSIS:  Thrombosed arteriovenous graft, right arm.  POSTOPERATIVE DIAGNOSIS:  Thrombosed arteriovenous graft, right arm.  ANESTHESIA:  General.  ASSISTANT:  Evorn Gong, PA-C  OPERATIVE FINDINGS: 1. Greater than 20 mm right axillary vein. 2. 4-7 mm tapered PTFE graft revised with a 7-mm PTFE on the distal     end.  OPERATIVE DETAILS:  After obtaining informed consent, the patient was taken to operating room.  The patient was placed in supine position on the operating table.  After induction of general anesthesia and endotracheal intubation, the patient's entire right upper extremity prepped and draped in usual sterile fashion.  Next, a longitudinal incision was made up in the right axilla through a preexisting scar. Incision was carried down through subcutaneous tissues down to level of the preexisting upper arm graft.  This was dissected free circumferentially.  There was dense scar tissue in the area, and the dissection here was quite tedious.  Dissection was carried up to the level of venous anastomosis.  The anastomosis appeared to be fairly aneurysmal.  Dissection was carried out past the level of venous anastomosis onto the native vein.  The native vein was quite large approximately 20-25 mm in diameter.  This was dissected free circumferentially and small side branches ligated and divided between silk ties.  The vein was mobilized over several centimeters.  Next, the patient was given 7000 units of intravenous heparin.  The patient was given an  additional 5000 units of heparin during the course of the case. Transverse graftotomy was made just above the level of venous anastomosis.  There was a large amount of thrombus within the graft.  It was apparent that the adherent thrombus was not going to be easily removed with a Fogarty catheter.  Therefore, the graft was transected and the vein opened longitudinally.  The thrombus was quite sticky and adherent to the vessel wall, and this required a large amount of manipulation to pull the thrombus out and away from the venous wall. The vein was quite large approximately 20-25 mm.  Therefore, I used a #6 Fogarty catheter.  We thrombectomized the venous portion of the graft. There was excellent backbleeding at this point.  The vein was then controlled with a Henley clamp.  I then used a #5 Fogarty catheter and thrombectomized the arterial limb of the graft.  I was able to get a trickle of flow, but there was not a good arterial inflow at this point. Therefore, a transverse incision was made through a preexisting scar near the antecubital crease, carried down through the subcutaneous tissues down to the level of the proximal aspect of the graft.  This was a 4-7 mm tapered PTFE graft.  The 4 mm end of the graft was dissected  free circumferentially and dissection carried all the way down to the level of preexisting arterial anastomosis.  A transverse graftotomy was made just above the arterial anastomosis, and there was laminated thrombus within this, which was also chronic and adherent.  This was removed under direct vision and a #3 and #4 Fogarty catheter passed through the anastomosis until excellent arterial inflow was obtained. There was no further debris visualized within the 4-mm end.  This was then clamped proximally with the fistula clamp.  The graftotomy was repaired using a running 6-0 Prolene suture.  At completion of the anastomosis, the clamp was released.  There was good  pulsatile flow in the graft at this point.  The graft was then clamped up in the axilla. A new 7-mm PTFE graft was brought up in the operative field and this was beveled and spatulated and sewn end-to-end to the axillary vein using a running 6-0 Prolene suture.  Just prior to completion of the anastomosis, this was forebled, backbled, and thoroughly flushed. Anastomosis was secured, clamps release was good, venous backbleeding from the graft immediately.  The graft was then cut to length and a graft-to-graft anastomosis completed using a running 6-0 Prolene suture. Just prior to completion of anastomosis, it was forebled, backbled, and thoroughly flushed.  Anastomosis was secured, clamps were released. There was palpable thrill above the graft immediately.  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  I would not recommend further revisions of this graft in the immediate future.  Hemostasis was obtained with direct pressure and administration of 50 mg of protamine.  The deep layers were closed and the axilla with a running 2-0 Vicryl suture followed by two layers of running 3-0 Vicryl suture and a 4-0 Vicryl subcuticular stitch in the skin.  Dermabond was applied.  The antecubital incision was closed with a running 3-0 Vicryl suture in the subcutaneous tissues followed by 4-0 Vicryl subcuticular stitch in the skin.  The patient tolerated the procedure well.  There were no complications.  Instrument, sponge, and needle counts were correct at the end of the case.  The patient had audible radial Doppler signal at the end of the case.     Jessy Oto. Fields, MD     CEF/MEDQ  D:  01/18/2011  T:  01/19/2011  Job:  [id number]  Electronically Signed by Ruta Hinds MD on 01/19/2011 11:42:57 AM

## 2011-01-20 ENCOUNTER — Ambulatory Visit (HOSPITAL_COMMUNITY): Payer: Medicare Other

## 2011-01-28 ENCOUNTER — Other Ambulatory Visit (HOSPITAL_COMMUNITY): Payer: Self-pay | Admitting: Nephrology

## 2011-01-28 DIAGNOSIS — N186 End stage renal disease: Secondary | ICD-10-CM

## 2011-02-02 ENCOUNTER — Other Ambulatory Visit (HOSPITAL_COMMUNITY): Payer: Self-pay | Admitting: Nephrology

## 2011-02-02 DIAGNOSIS — N186 End stage renal disease: Secondary | ICD-10-CM

## 2011-02-04 NOTE — Progress Notes (Signed)
  Certified Letter was returned 12/23/10 "unclaimed." Pt has not had labs completed at this time.

## 2011-02-10 ENCOUNTER — Ambulatory Visit (HOSPITAL_COMMUNITY)
Admission: RE | Admit: 2011-02-10 | Discharge: 2011-02-10 | Disposition: A | Discharge status: Home / Self Care | Payer: Medicare Other | Source: Ambulatory Visit | Attending: Vascular Surgery | Admitting: Vascular Surgery

## 2011-02-10 DIAGNOSIS — G4733 Obstructive sleep apnea (adult) (pediatric): Secondary | ICD-10-CM | POA: Insufficient documentation

## 2011-02-10 DIAGNOSIS — T82898A Other specified complication of vascular prosthetic devices, implants and grafts, initial encounter: Secondary | ICD-10-CM

## 2011-02-10 DIAGNOSIS — I120 Hypertensive chronic kidney disease with stage 5 chronic kidney disease or end stage renal disease: Secondary | ICD-10-CM | POA: Insufficient documentation

## 2011-02-10 DIAGNOSIS — I708 Atherosclerosis of other arteries: Secondary | ICD-10-CM

## 2011-02-10 DIAGNOSIS — I871 Compression of vein: Secondary | ICD-10-CM | POA: Insufficient documentation

## 2011-02-10 DIAGNOSIS — N186 End stage renal disease: Secondary | ICD-10-CM | POA: Insufficient documentation

## 2011-02-10 DIAGNOSIS — Y832 Surgical operation with anastomosis, bypass or graft as the cause of abnormal reaction of the patient, or of later complication, without mention of misadventure at the time of the procedure: Secondary | ICD-10-CM | POA: Insufficient documentation

## 2011-02-10 DIAGNOSIS — I509 Heart failure, unspecified: Secondary | ICD-10-CM | POA: Insufficient documentation

## 2011-02-10 LAB — POCT I-STAT, CHEM 8
Calcium, Ion: 1.06 mmol/L — ABNORMAL LOW (ref 1.12–1.32)
Chloride: 101 mEq/L (ref 96–112)
HCT: 36 % — ABNORMAL LOW (ref 39.0–52.0)
Sodium: 139 mEq/L (ref 135–145)
TCO2: 27 mmol/L (ref 0–100)

## 2011-02-14 NOTE — Op Note (Signed)
[name] [name], COLGLAZIER NO.:  [id number]  MEDICAL RECORD NO.:  [id number]  LOCATION:  [facility]                         FACILITY:  [facility]  PHYSICIAN:  Conrad [place], MD       DATE OF BIRTH:  06/25/1965  DATE OF PROCEDURE:  02/10/2011 DATE OF DISCHARGE:                              OPERATIVE REPORT   PROCEDURES: 1. Right upper arm arteriovenous graft cannulation under ultrasound     guidance. 2. Right arm shuntogram. 3. Percutaneous thrombectomy of the right upper arm arteriovenous     graft. 4. Angioplasty of the right upper arm arteriovenous graft and right     axillary vein. 5. Left arm and central venogram.  PREOPERATIVE DIAGNOSIS:  Right upper arm arteriovenous graft malfunction.  POSTOPERATIVE DIAGNOSIS:  Right upper arm arteriovenous graft malfunction.  SURGEON:  Conrad [place], MD  ANESTHESIA:  Conscious sedation.  ESTIMATED BLOOD LOSS:  Minimal.  CONTRAST:  95 mL.  SPECIMENS:  None.  FINDINGS:  Included: 1. Web of veins draining the left arm with patent left cephalic     vein and high brachial veins.  They are quite distended.     Unfortunately, the left innominate vein is occluded by the tunneled     dialysis catheter, suggesting left innominate vein stenosis. 2. There is nonocclusive thrombus present in the right upper arm     arteriovenous graft just distal to the venous anastomosis that     occupies greater than 80% of the lumen that resolved after aspiration     and venoplasty. 3. Patent but small arterial anastomosis.  INDICATIONS:  This is a 45 year old patient who presents with malfunctioning right upper arm graft.  Based on reviewing the chart, it seems that this graft is at risk for thrombosis and there are instructions to no longer revise this graft in the patient's back chart. Subsequently, I felt that along with the right arm shuntogram and possible intervention, he needed to have his left arm imaged via the IV present  to try to  determine if there is any other options for a left arm access. The patient is aware of the risks of this procedure include bleeding, infection, possible thrombosis of this graft, possible anaphylactic reaction to the contrast dye, possible pulmonary embolism, if any type of angioplasty or thrombus occurs, also a risk of possible rupture of any veins that are venoplastied.  The patient was aware of these risks and agreed to proceed forward.  DESCRIPTION OF OPERATION:  After full informed written consent was obtained from the patient, he was brought back to the angio suite and placed supine upon angio table.  He was connected to monitoring equipment and given conscious sedation, amounts of which are documented in his chart.  He was then prepped and draped in standard fashion for a right arm shuntogram.  First, the patient's left antecubital IV was connected to IV extension tubing.  He was positioned under the image intensifier and using hand injections, the left upper arm and central venous structures were imaged.  The findings of which are listed above. We then turned our attention to the right arm under ultrasound guidance, I cannulated  the right upper arm arteriovenous graft with a micropuncture needle and passed a micro wire up into the graft.  The needle was exchanged for the micro sheath, which I lodged about 2 cm into the graft.  We then removed the wire and connected the sheath to IV extension tubing and completed the shuntogram.  The findings of which were listed above.  I was concerned that in fact this patient had a nonocclusive thrombus that was in the process of shutting down this graft. Subsequently, I felt a percutaneous thrombectomy was going to be necessary.  I placed a Bentson wire through the sheath and exchanged the micro sheath out for a 6-French short sheath.  We then over the wire placed a 5-French end-hole catheter, which I advanced to the level of what appeared to  be thrombus and aspirated the entirety of this graft. We injected the aspirate onto sterile 4 x 4's, but no frank thrombus material was obtained.  I at this point, obtained a 6 mm x 20 mm angioplasty balloon and then angioplastied the area of the thrombus to macerate the lesion.  On completion studies, there remained some thrombus within this graft.  Subsequently, I elected to go to a 7 mm x 40 mm balloon, which I not only angioplastied the graft, but also the area of the anastomosis, which consists of the axillary vein, where there appeared to be some degree of stenosis.  This was done to 14 atmospheres  at 1 minute.  We then removed the balloon.  Hand injections demonstrated resolution of this thrombus and improvement in the area of stenosis.  The patient did not sustain any evidence clinically of a pulmonary embolism.   Please note, I had given him 5000 units of heparin intravenously even before aspirating the thrombus.  At this point, we removed all balloons and wires.  Please note that when I had the 7 mm x 40 mm angioplasty balloon inflated, I did reflux some dye down to the arterial anastomosis, which was patent. At this point, I tied a 4-0 Monocryl pursestring suture around the 6- French sheath and then tied the stitch while pulling out the sheath.  In such fashion, we successfully removed the sheath without causing any hematoma on arm.  Gentle pressure was applied here.  The suture was cut and then a sterile dressing was applied.  The patient tolerated this procedure well.  COMPLICATIONS:  None.  CONDITION:  Stable.     Conrad , MD     BLC/MEDQ  D:  02/10/2011  T:  02/10/2011  Job:  [id number]  Electronically Signed by Adele Barthel MD on 02/14/2011 10:15:49 AM

## 2011-02-15 ENCOUNTER — Ambulatory Visit (HOSPITAL_COMMUNITY): Payer: Medicare Other

## 2011-02-18 ENCOUNTER — Other Ambulatory Visit (HOSPITAL_COMMUNITY): Payer: Self-pay | Admitting: Nephrology

## 2011-02-18 DIAGNOSIS — N186 End stage renal disease: Secondary | ICD-10-CM

## 2011-02-22 ENCOUNTER — Other Ambulatory Visit: Payer: Self-pay | Admitting: Neurology

## 2011-02-22 DIAGNOSIS — R51 Headache: Secondary | ICD-10-CM

## 2011-02-22 DIAGNOSIS — R42 Dizziness and giddiness: Secondary | ICD-10-CM

## 2011-02-24 ENCOUNTER — Ambulatory Visit (HOSPITAL_COMMUNITY)
Admission: RE | Admit: 2011-02-24 | Discharge: 2011-02-24 | Disposition: A | Discharge status: Home / Self Care | Payer: Medicare Other | Source: Ambulatory Visit | Attending: Neurology | Admitting: Neurology

## 2011-02-24 ENCOUNTER — Ambulatory Visit (HOSPITAL_COMMUNITY): Payer: Medicare Other

## 2011-02-24 DIAGNOSIS — R51 Headache: Secondary | ICD-10-CM | POA: Insufficient documentation

## 2011-02-24 DIAGNOSIS — R42 Dizziness and giddiness: Secondary | ICD-10-CM | POA: Insufficient documentation

## 2011-02-28 ENCOUNTER — Encounter: Payer: Self-pay | Admitting: Vascular Surgery

## 2011-02-28 ENCOUNTER — Other Ambulatory Visit: Payer: Self-pay | Admitting: *Deleted

## 2011-02-28 ENCOUNTER — Encounter (HOSPITAL_COMMUNITY): Payer: Self-pay | Admitting: Pharmacy Technician

## 2011-02-28 ENCOUNTER — Encounter (HOSPITAL_COMMUNITY): Payer: Self-pay | Admitting: *Deleted

## 2011-02-28 MED ORDER — DEXTROSE 5 % IV SOLN
1.5000 g | Freq: Once | INTRAVENOUS | Status: AC
  Administered 2011-03-01: 1.5 g via INTRAVENOUS
  Filled 2011-02-28: qty 1.5

## 2011-02-28 MED ORDER — SODIUM CHLORIDE 0.9 % IV SOLN: INTRAVENOUS | Status: DC

## 2011-03-01 ENCOUNTER — Ambulatory Visit (HOSPITAL_COMMUNITY): Payer: Medicare Other | Admitting: Anesthesiology

## 2011-03-01 ENCOUNTER — Encounter (HOSPITAL_COMMUNITY): Payer: Self-pay | Admitting: Anesthesiology

## 2011-03-01 ENCOUNTER — Encounter: Payer: Self-pay | Admitting: Physician Assistant

## 2011-03-01 ENCOUNTER — Encounter (HOSPITAL_COMMUNITY)
Admission: RE | Disposition: A | Discharge status: Home / Self Care | Payer: Self-pay | Source: Ambulatory Visit | Attending: Vascular Surgery

## 2011-03-01 ENCOUNTER — Ambulatory Visit (HOSPITAL_COMMUNITY): Payer: Medicare Other

## 2011-03-01 ENCOUNTER — Ambulatory Visit (HOSPITAL_COMMUNITY)
Admission: RE | Admit: 2011-03-01 | Discharge: 2011-03-01 | Disposition: A | Discharge status: Home / Self Care | Payer: Medicare Other | Source: Ambulatory Visit | Attending: Vascular Surgery | Admitting: Vascular Surgery

## 2011-03-01 DIAGNOSIS — I120 Hypertensive chronic kidney disease with stage 5 chronic kidney disease or end stage renal disease: Secondary | ICD-10-CM | POA: Insufficient documentation

## 2011-03-01 DIAGNOSIS — T82898A Other specified complication of vascular prosthetic devices, implants and grafts, initial encounter: Secondary | ICD-10-CM | POA: Insufficient documentation

## 2011-03-01 DIAGNOSIS — N186 End stage renal disease: Secondary | ICD-10-CM

## 2011-03-01 DIAGNOSIS — Z87891 Personal history of nicotine dependence: Secondary | ICD-10-CM | POA: Insufficient documentation

## 2011-03-01 DIAGNOSIS — Y849 Medical procedure, unspecified as the cause of abnormal reaction of the patient, or of later complication, without mention of misadventure at the time of the procedure: Secondary | ICD-10-CM | POA: Insufficient documentation

## 2011-03-01 DIAGNOSIS — I509 Heart failure, unspecified: Secondary | ICD-10-CM | POA: Insufficient documentation

## 2011-03-01 HISTORY — PX: THROMBECTOMY W/ EMBOLECTOMY: SHX2507

## 2011-03-01 HISTORY — DX: Dependence on renal dialysis: Z99.2

## 2011-03-01 HISTORY — PX: INSERTION OF DIALYSIS CATHETER: SHX1324

## 2011-03-01 HISTORY — DX: Angina pectoris, unspecified: I20.9

## 2011-03-01 LAB — BASIC METABOLIC PANEL
CO2: 26 mEq/L (ref 19–32)
Calcium: 9.6 mg/dL (ref 8.4–10.5)
Potassium: 4.8 mEq/L (ref 3.5–5.1)
Sodium: 142 mEq/L (ref 135–145)

## 2011-03-01 LAB — DIFFERENTIAL
Eosinophils Relative: 7 % — ABNORMAL HIGH (ref 0–5)
Lymphocytes Relative: 15 % (ref 12–46)
Lymphs Abs: 1.3 10*3/uL (ref 0.7–4.0)
Monocytes Relative: 7 % (ref 3–12)
Neutrophils Relative %: 71 % (ref 43–77)

## 2011-03-01 LAB — CBC
Hemoglobin: 12 g/dL — ABNORMAL LOW (ref 13.0–17.0)
MCV: 81 fL (ref 78.0–100.0)
Platelets: 245 10*3/uL (ref 150–400)
RBC: 4.8 MIL/uL (ref 4.22–5.81)
WBC: 8.7 10*3/uL (ref 4.0–10.5)

## 2011-03-01 LAB — PROTIME-INR: INR: 1.04 (ref 0.00–1.49)

## 2011-03-01 SURGERY — THROMBECTOMY ARTERIOVENOUS GORE-TEX GRAFT
Anesthesia: General | Site: Arm Upper | Laterality: Right | Wound class: Clean

## 2011-03-01 MED ORDER — HEPARIN SODIUM (PORCINE) 1000 UNIT/ML IJ SOLN
INTRAMUSCULAR | Status: DC | PRN
  Administered 2011-03-01: 5000 [IU] via INTRAVENOUS
  Administered 2011-03-01: 10000 [IU] via INTRAVENOUS

## 2011-03-01 MED ORDER — SODIUM CHLORIDE 0.9 % IV SOLN: INTRAVENOUS | Status: DC

## 2011-03-01 MED ORDER — HEPARIN SODIUM (PORCINE) 1000 UNIT/ML IJ SOLN
INTRAMUSCULAR | Status: DC | PRN
  Administered 2011-03-01: 5000 [IU] via INTRAVENOUS

## 2011-03-01 MED ORDER — ONDANSETRON HCL 4 MG/2ML IJ SOLN
INTRAMUSCULAR | Status: DC | PRN
  Administered 2011-03-01: 4 mg via INTRAVENOUS

## 2011-03-01 MED ORDER — OXYCODONE HCL 5 MG PO TABS: 5.0000 mg | ORAL_TABLET | ORAL | Status: DC | PRN

## 2011-03-01 MED ORDER — SODIUM CHLORIDE 0.9 % IR SOLN
Status: DC | PRN
  Administered 2011-03-01: 1000 mL

## 2011-03-01 MED ORDER — PROPOFOL 10 MG/ML IV EMUL
INTRAVENOUS | Status: DC | PRN
  Administered 2011-03-01: 200 mg via INTRAVENOUS

## 2011-03-01 MED ORDER — MORPHINE SULFATE 2 MG/ML IJ SOLN: 0.0500 mg/kg | INTRAMUSCULAR | Status: DC | PRN

## 2011-03-01 MED ORDER — PROTAMINE SULFATE 10 MG/ML IV SOLN
INTRAVENOUS | Status: DC | PRN
  Administered 2011-03-01: 15 mg via INTRAVENOUS
  Administered 2011-03-01 (×6): 10 mg via INTRAVENOUS

## 2011-03-01 MED ORDER — PROMETHAZINE HCL 25 MG/ML IJ SOLN: 6.2500 mg | INTRAMUSCULAR | Status: DC | PRN

## 2011-03-01 MED ORDER — MIDAZOLAM HCL 2 MG/2ML IJ SOLN: 1.0000 mg | INTRAMUSCULAR | Status: DC | PRN

## 2011-03-01 MED ORDER — FENTANYL CITRATE 0.05 MG/ML IJ SOLN: 50.0000 ug | INTRAMUSCULAR | Status: DC | PRN

## 2011-03-01 MED ORDER — SODIUM CHLORIDE 0.9 % IR SOLN
Status: DC | PRN
  Administered 2011-03-01: 12:00:00

## 2011-03-01 MED ORDER — SODIUM CHLORIDE 0.9 % IV SOLN
INTRAVENOUS | Status: DC
  Administered 2011-03-01: 11:00:00 via INTRAVENOUS

## 2011-03-01 MED ORDER — ROCURONIUM BROMIDE 100 MG/10ML IV SOLN
INTRAVENOUS | Status: DC | PRN
  Administered 2011-03-01: 50 mg via INTRAVENOUS

## 2011-03-01 MED ORDER — LIDOCAINE HCL (CARDIAC) 20 MG/ML IV SOLN
INTRAVENOUS | Status: DC | PRN
  Administered 2011-03-01: 40 mg via INTRAVENOUS

## 2011-03-01 MED ORDER — NEOSTIGMINE METHYLSULFATE 1 MG/ML IJ SOLN
INTRAMUSCULAR | Status: DC | PRN
  Administered 2011-03-01: 3 mg via INTRAVENOUS

## 2011-03-01 MED ORDER — PHENYLEPHRINE HCL 10 MG/ML IJ SOLN
INTRAMUSCULAR | Status: DC | PRN
  Administered 2011-03-01: 80 ug via INTRAVENOUS
  Administered 2011-03-01: 40 ug via INTRAVENOUS
  Administered 2011-03-01: 80 ug via INTRAVENOUS
  Administered 2011-03-01: 40 ug via INTRAVENOUS
  Administered 2011-03-01 (×5): 80 ug via INTRAVENOUS
  Administered 2011-03-01 (×3): 40 ug via INTRAVENOUS

## 2011-03-01 MED ORDER — HYDROMORPHONE HCL PF 1 MG/ML IJ SOLN: 0.2500 mg | INTRAMUSCULAR | Status: DC | PRN

## 2011-03-01 MED ORDER — SODIUM CHLORIDE 0.9 % IV SOLN
INTRAVENOUS | Status: DC | PRN
  Administered 2011-03-01 (×2): via INTRAVENOUS

## 2011-03-01 MED ORDER — MIDAZOLAM HCL 2 MG/2ML IJ SOLN: 0.5000 mg | Freq: Once | INTRAMUSCULAR | Status: DC | PRN

## 2011-03-01 MED ORDER — GLYCOPYRROLATE 0.2 MG/ML IJ SOLN
INTRAMUSCULAR | Status: DC | PRN
  Administered 2011-03-01: .4 mg via INTRAVENOUS

## 2011-03-01 MED ORDER — FENTANYL CITRATE 0.05 MG/ML IJ SOLN
INTRAMUSCULAR | Status: DC | PRN
  Administered 2011-03-01 (×3): 50 ug via INTRAVENOUS
  Administered 2011-03-01: 75 ug via INTRAVENOUS
  Administered 2011-03-01: 50 ug via INTRAVENOUS

## 2011-03-01 MED ORDER — MEPERIDINE HCL 25 MG/ML IJ SOLN: 6.2500 mg | INTRAMUSCULAR | Status: DC | PRN

## 2011-03-01 SURGICAL SUPPLY — 69 items
ADH SKN CLS APL DERMABOND .7 (GAUZE/BANDAGES/DRESSINGS) ×2
BAG DECANTER FOR FLEXI CONT (MISCELLANEOUS) ×3 IMPLANT
CANISTER SUCTION 2500CC (MISCELLANEOUS) ×3 IMPLANT
CATH CANNON HEMO 15F 50CM (CATHETERS) IMPLANT
CATH CANNON HEMO 15FR 19 (HEMODIALYSIS SUPPLIES) IMPLANT
CATH CANNON HEMO 15FR 23CM (HEMODIALYSIS SUPPLIES) IMPLANT
CATH CANNON HEMO 15FR 31CM (HEMODIALYSIS SUPPLIES) IMPLANT
CATH CANNON HEMO 15FR 32CM (HEMODIALYSIS SUPPLIES) ×3 IMPLANT
CATH EMB 4FR 80CM (CATHETERS) ×3 IMPLANT
CATH EMB 6FR 80CM (CATHETERS) ×3 IMPLANT
CHLORAPREP W/TINT 26ML (MISCELLANEOUS) ×3 IMPLANT
CLIP TI MEDIUM 6 (CLIP) ×3 IMPLANT
CLIP TI WIDE RED SMALL 6 (CLIP) ×3 IMPLANT
CLOTH BEACON ORANGE TIMEOUT ST (SAFETY) ×3 IMPLANT
COVER PROBE W GEL 5X96 (DRAPES) ×3 IMPLANT
COVER SURGICAL LIGHT HANDLE (MISCELLANEOUS) ×6 IMPLANT
CUFF TOURNIQUET SINGLE 34IN LL (TOURNIQUET CUFF) ×3 IMPLANT
DECANTER SPIKE VIAL GLASS SM (MISCELLANEOUS) ×3 IMPLANT
DERMABOND ADVANCED (GAUZE/BANDAGES/DRESSINGS) ×1
DERMABOND ADVANCED .7 DNX12 (GAUZE/BANDAGES/DRESSINGS) ×2 IMPLANT
DRAPE C-ARM 42X72 X-RAY (DRAPES) ×3 IMPLANT
DRAPE CHEST BREAST 15X10 FENES (DRAPES) ×3 IMPLANT
DRAPE X-RAY CASS 24X20 (DRAPES) IMPLANT
ELECT REM PT RETURN 9FT ADLT (ELECTROSURGICAL) ×3
ELECTRODE REM PT RTRN 9FT ADLT (ELECTROSURGICAL) ×2 IMPLANT
GAUZE SPONGE 2X2 8PLY STRL LF (GAUZE/BANDAGES/DRESSINGS) ×2 IMPLANT
GAUZE SPONGE 4X4 16PLY XRAY LF (GAUZE/BANDAGES/DRESSINGS) ×3 IMPLANT
GEL ULTRASOUND 20GR AQUASONIC (MISCELLANEOUS) IMPLANT
GLOVE BIO SURGEON STRL SZ7.5 (GLOVE) ×6 IMPLANT
GLOVE BIOGEL PI IND STRL 7.0 (GLOVE) ×6 IMPLANT
GLOVE BIOGEL PI IND STRL 7.5 (GLOVE) ×2 IMPLANT
GLOVE BIOGEL PI INDICATOR 7.0 (GLOVE) ×3
GLOVE BIOGEL PI INDICATOR 7.5 (GLOVE) ×1
GOWN PREVENTION PLUS XLARGE (GOWN DISPOSABLE) ×6 IMPLANT
GOWN STRL NON-REIN LRG LVL3 (GOWN DISPOSABLE) ×15 IMPLANT
GRAFT GORETEX STRT 6X50 (Vascular Products) ×3 IMPLANT
KIT BASIN OR (CUSTOM PROCEDURE TRAY) ×3 IMPLANT
KIT ROOM TURNOVER OR (KITS) ×3 IMPLANT
LOOP VESSEL MINI RED (MISCELLANEOUS) IMPLANT
NEEDLE 18GX1X1/2 (RX/OR ONLY) (NEEDLE) ×3 IMPLANT
NEEDLE HYPO 25GX1X1/2 BEV (NEEDLE) ×3 IMPLANT
NS IRRIG 1000ML POUR BTL (IV SOLUTION) ×3 IMPLANT
PACK CV ACCESS (CUSTOM PROCEDURE TRAY) ×3 IMPLANT
PACK GENERAL/GYN (CUSTOM PROCEDURE TRAY) ×3 IMPLANT
PACK SURGICAL SETUP 50X90 (CUSTOM PROCEDURE TRAY) IMPLANT
PAD ARMBOARD 7.5X6 YLW CONV (MISCELLANEOUS) ×6 IMPLANT
PADDING CAST ABS 6INX4YD NS (CAST SUPPLIES) ×1
PADDING CAST ABS COTTON 6X4 NS (CAST SUPPLIES) ×2 IMPLANT
SET COLLECT BLD 21X3/4 12 (NEEDLE) IMPLANT
SPONGE GAUZE 2X2 STER 10/PKG (GAUZE/BANDAGES/DRESSINGS) ×1
SPONGE SURGIFOAM ABS GEL 100 (HEMOSTASIS) IMPLANT
STOPCOCK 4 WAY LG BORE MALE ST (IV SETS) IMPLANT
SUT ETHILON 3 0 PS 1 (SUTURE) ×6 IMPLANT
SUT PROLENE 5 0 C 1 24 (SUTURE) ×3 IMPLANT
SUT PROLENE 6 0 CC (SUTURE) ×12 IMPLANT
SUT VIC AB 3-0 SH 27 (SUTURE) ×9
SUT VIC AB 3-0 SH 27X BRD (SUTURE) ×6 IMPLANT
SUT VICRYL 4-0 PS2 18IN ABS (SUTURE) ×9 IMPLANT
SYR 20CC LL (SYRINGE) IMPLANT
SYR 30ML LL (SYRINGE) IMPLANT
SYR 5ML LL (SYRINGE) ×6 IMPLANT
SYR CONTROL 10ML LL (SYRINGE) IMPLANT
SYRINGE 10CC LL (SYRINGE) ×3 IMPLANT
TAPE CLOTH SURG 4X10 WHT LF (GAUZE/BANDAGES/DRESSINGS) ×3 IMPLANT
TOWEL OR 17X24 6PK STRL BLUE (TOWEL DISPOSABLE) ×3 IMPLANT
TOWEL OR 17X26 10 PK STRL BLUE (TOWEL DISPOSABLE) ×3 IMPLANT
TUBING EXTENTION W/L.L. (IV SETS) IMPLANT
UNDERPAD 30X30 INCONTINENT (UNDERPADS AND DIAPERS) ×3 IMPLANT
WATER STERILE IRR 1000ML POUR (IV SOLUTION) ×3 IMPLANT

## 2011-03-01 NOTE — Progress Notes (Signed)
[facility] HISTORY AND PHYSICAL    History of Present Illness:  Patient is a 45 y.o. year old male who presents for a clotted right upper arm graft.  The graft was placed in July and has clotted on 2 prior occasions 10/2 and 10/25.  He has a dialysis catheter on the left side which is also working marginally.   Physical Examination  Filed Vitals:   03/01/11 0741  BP: 152/93  Pulse: 88  Temp: 98.2 F (36.8 C)  Resp: 20    Body mass index is 47.06 kg/(m^2).  General:  Alert and oriented, no acute distress Neck: No bruit or JVD Skin: No rash Chest: left side dialysis catheter Extremities:  Thrombosed right upper arm graft, 2 + radial pulse Neurologic: Upper and lower extremity motor 5/5 and symmetric    ASSESSMENT: Clotted right arm graft with poorly functioning catheter   PLAN:Exchange catheter, thrombectomy and revision right arm AV Graft   Ruta Hinds, MD [facility] Office: [phone] Pager: [phone]

## 2011-03-01 NOTE — Preoperative (Signed)
Beta Blockers   Reason not to administer Beta Blockers:Not Applicable 

## 2011-03-01 NOTE — Anesthesia Preprocedure Evaluation (Addendum)
Anesthesia Evaluation  Patient identified by MRN, date of birth, ID band Patient awake    Reviewed: Allergy & Precautions, H&P , NPO status , Patient's Chart, lab work & pertinent test results  Airway Mallampati: II TM Distance: >3 FB Neck ROM: Full    Dental  (+) Teeth Intact and Dental Advisory Given   Pulmonary former smoker         Cardiovascular hypertension, Pt. on medications + angina +CHF     Neuro/Psych    GI/Hepatic   Endo/Other  Morbid obesity  Renal/GU ESRFRenal disease     Musculoskeletal   Abdominal   Peds  Hematology   Anesthesia Other Findings   Reproductive/Obstetrics                         Anesthesia Physical Anesthesia Plan  ASA: III  Anesthesia Plan: General   Post-op Pain Management:    Induction:   Airway Management Planned: Oral ETT  Additional Equipment:   Intra-op Plan:   Post-operative Plan: Extubation in OR  Informed Consent: I have reviewed the patients History and Physical, chart, labs and discussed the procedure including the risks, benefits and alternatives for the proposed anesthesia with the patient or authorized representative who has indicated his/her understanding and acceptance.   Dental advisory given  Plan Discussed with: CRNA  Anesthesia Plan Comments:         Anesthesia Quick Evaluation

## 2011-03-01 NOTE — Anesthesia Postprocedure Evaluation (Signed)
  Anesthesia Post-op Note  Patient: [name] [name]  Procedure(s) Performed:  THROMBECTOMY ARTERIOVENOUS GORE-TEX GRAFT - Attempted Thrombectomy of Old  Right Upper Arm Arteriovenous gortex Graft. Insertion of new Arteriovenous Graft using 36mm x 50cm Gortex Stretch graft. ; INSERTION OF DIALYSIS CATHETER - Exchange of Dialysis Catheter to 27cm 15Fr. Arrow Catheter  Patient Location: PACU  Anesthesia Type: General  Level of Consciousness: awake  Airway and Oxygen Therapy: Patient Spontanous Breathing  Post-op Pain: mild  Post-op Assessment: Post-op Vital signs reviewed  Post-op Vital Signs: stable  Complications: No apparent anesthesia complications

## 2011-03-01 NOTE — Op Note (Signed)
Procedure: #1 attempted thrombectomy right upper arm AV graft  #2 new right upper arm AV graft  #3 placement of left internal jugular vein Diatek catheter  Preoperative diagnosis: #1 end-stage renal disease #2 thrombosed right arm AV graft  Postoperative diagnosis: Same  Anesthesia: Gen.  Assistant: Evorn Gong PA-C, Remo Lipps RNFA  Operative findings: #128 cm Diatek catheter left internal jugular vein #2 end-to-end anastomosis to axillary vein with 6 mm PTFE  Operative details: After obtaining informed consent, the patient was taken to the operating room. The patient was placed in supine position on the operating room table. After induction of general anesthesia, the patient's entire right upper extremity was prepped and draped in usual sterile fashion. A longitudinal incision was made in the right axilla through a pre-existing scar. The incision was carried down through the subcutaneous tissues down to the level of the graft. The graft was dissected free circumferentially. The native vein was quite large approximately 20 mm in diameter. Dissection was carried around the graft and around the distal portion of the vein. The patient was given 10,000 units of intravenous heparin. Transverse graftotomy was made just above the level the venous anastomosis. #4 and 6 Fogarty catheters were used to thrombectomize the venous limb of the graft. However due to the large size of the graft I had to take out much of the thrombus under direct vision as the balloon of the 6 catheter was not large enough to pull all the clot out. At this point there was good backbleeding from the vein and this was clamped proximally with a fistula clamp. The arterial limb of the graft was thrombectomized with a #4 Fogarty catheter. All thrombotic material was removed and arterialized plug was treated. There was excellent arterial inflow. At this point I reviewed all the patient's previous images from a recent  shuntogram. There is no obvious reason why this graft continued to clot. I decided that the only possible new solution at this point would be to replace the existing 4-7 mm tapered graft with a 6 mm PTFE graft to try to increase arterial inflow. At this point I made an additional transverse incision near the antecubital area an incision was carried onto subcutaneous tissues down to level of the pre-existing arterial anastomosis from an old forearm graft. I dissected this anastomosis circumferentially and also the native brachial artery circumferentially. During the course of dissection the anterior wall of the brachial artery was entered and a tourniquet was inflated on the upper arm for approximately 3 minutes to repair the small hole. I then proceeded to continue the dissection of the brachial artery. Vessel loops were placed proximal and distal to the planned site of arteriotomy. Next a subcutaneous tunnel was created connecting upper arm and lower arm incision and a 6 mm PTFE graft was brought through this subcutaneous tunnel. The graft was tunneled lateral of all of his previous access procedures. The patient was given an additional 5000 units of intravenous heparin. A fistula clamp was used to control the brachial artery proximally. A vessel loop was used to control the artery distally. The old arterial anastomosis was completely taken down all the PTFE material debrided away. The 36mm  PTFE was then beveled on one end and sewn end of graft to side of artery using running 6-0 Prolene suture. Just prior completion anastomosis was forebled backbled and thoroughly flushed the anastomosis was secured, and the clamps were removed.   One additional repair stitch was placed in the heel of  the graft.  There was pulsatile flow in the graft.   The graft was pulled taut to length in the upper arm axillary incision. The old graft was transected and the proximal aspect oversewn with a running 5-0 Prolene suture. The stump  of the old 7 mm portion of the graft was left on the vein and this was sewn end and to the new graft using a running 6-0 Prolene suture. Just prior completion of the anastomosis this was forebled backbled and thoroughly flushed the anastomosis was secured clamps released there is palpable thrill the graft immediately. Patient had audible radial and ulnar Doppler flow. Patient was given 75 mg of protamine for heparin reversal. After all incisions were made hemostatic they were both closed with running 3-0 Vicryl suture the subcutaneous tissues followed by 4 Vicryl subcuticular stitch and the skin. Dermabond was applied to the incisions.  Attention was then turned to placing a new dialysis catheter. The patient had a pre-existing left internal jugular vein dialysis catheter that was not currently working. The entire neck and chest were prepped and draped in the usual sterile fashion. A transverse incision was made over the catheter at the base of left neck. The incision was carried into the subcutaneous down to level catheter. The catheter was dissected free circumferentially and clamped proximally and distally with hemostats. This was then divided. The distal end of the catheter was pulled gently with traction and removed. The proximal end was opened and an 035 J-tipped were threaded through the lumen of the catheter down into the inferior vena cava under fluoroscopic guidance. At this point the old tip of the catheter was removed.   There was some thrombus on the tip. This was removed and passed off the table. A new 28 cm Diatek catheter was then brought in operative field. Sequential 12, 14 and 16 French dilators with a peel-away sheath was placed over the guidewire and the right atrium. There was some tortuosity in the left internal jugular vein so the dilators were passed carefully under fluoroscopic guidance. Also when the last dilator was removed the peel-away sheath was left in place and the 28cm Diatek  catheter placed using an over-the-wire weave technique to place a catheter into the tip the right atrium. The peel-away sheath was then removed and the catheter tunneled subcutaneously through the old tunnel site and secured to skin with a nylon suture. The neck insertion site was closed with Vicryl stitch. The catheter was noted to flush and draw easily. The catheter was inspected under fluoroscopy found with its tip in the right atrium. There were no kinks throughout its course. The patient tolerated the procedure well and there were complications. Sponge and needle counts were correct at the end of the case. Patient was taken to the recovery room in stable condition.

## 2011-03-01 NOTE — OR Nursing (Signed)
Procedure 1 End at [id number]  Procedure 2: Dialysis Catheter Exchange [phone]

## 2011-03-01 NOTE — Anesthesia Procedure Notes (Addendum)
Procedure Name: Intubation Date/Time: 03/01/2011 11:04 AM Performed by: Claria Dice Pre-anesthesia Checklist: Patient identified, Emergency Drugs available, Suction available, Patient being monitored and Timeout performed Patient Re-evaluated:Patient Re-evaluated prior to inductionOxygen Delivery Method: Circle System Utilized Preoxygenation: Pre-oxygenation with 100% oxygen Intubation Type: Combination inhalational/ intravenous induction Ventilation: Mask ventilation without difficulty Laryngoscope Size: Mac and 4

## 2011-03-01 NOTE — Transfer of Care (Signed)
Immediate Anesthesia Transfer of Care Note  Patient: [name] [name]  Procedure(s) Performed:  THROMBECTOMY ARTERIOVENOUS GORE-TEX GRAFT - Attempted Thrombectomy of Old  Right Upper Arm Arteriovenous gortex Graft. Insertion of new Arteriovenous Graft using 26mm x 50cm Gortex Stretch graft. ; INSERTION OF DIALYSIS CATHETER - Exchange of Dialysis Catheter to 27cm 15Fr. Arrow Catheter  Patient Location: PACU  Anesthesia Type: General  Level of Consciousness: awake, alert  and oriented  Airway & Oxygen Therapy: Patient Spontanous Breathing  Post-op Assessment: Report given to PACU RN and Post -op Vital signs reviewed and stable  Post vital signs: stable  Complications: No apparent anesthesia complications

## 2011-03-02 ENCOUNTER — Ambulatory Visit: Payer: BC Managed Care – PPO

## 2011-03-03 ENCOUNTER — Encounter (HOSPITAL_COMMUNITY): Payer: Self-pay | Admitting: Vascular Surgery

## 2011-03-04 ENCOUNTER — Ambulatory Visit: Payer: Medicare Other | Admitting: Vascular Surgery

## 2011-03-08 ENCOUNTER — Ambulatory Visit (HOSPITAL_COMMUNITY): Payer: Medicare Other

## 2011-03-17 ENCOUNTER — Encounter: Payer: Self-pay | Admitting: Physician Assistant

## 2011-03-18 ENCOUNTER — Ambulatory Visit: Payer: BC Managed Care – PPO

## 2011-03-22 ENCOUNTER — Encounter: Payer: Self-pay | Admitting: Thoracic Diseases

## 2011-03-23 ENCOUNTER — Ambulatory Visit (INDEPENDENT_AMBULATORY_CARE_PROVIDER_SITE_OTHER): Payer: BC Managed Care – PPO | Admitting: Thoracic Diseases

## 2011-03-23 VITALS — BP 152/71 | HR 92 | Resp 20 | Ht 72.0 in | Wt 343.0 lb

## 2011-03-23 DIAGNOSIS — N186 End stage renal disease: Secondary | ICD-10-CM

## 2011-03-23 NOTE — Patient Instructions (Signed)
Right Arm AVGG is functioning well 2 weeks post-op.  Do not stick graft until after 04/07/11

## 2011-03-23 NOTE — Progress Notes (Signed)
[facility] [place]  Postoperative Visit hemodialysis access   Date of Surgery: 03/08/11 Surgeon: [place]: [place], [place], Dr Hinda Lenis  HPI: [name] [name] is a 45 y.o. male who is 2 weeks S/P creation/revision of right upper extremity Hemodialysis access. The patient denies symptoms of numbness, tingling, weakness and denies pain in the operative limb. Patient is here for post -op evaluation to assess healing and maturation of right AVGG .  Pt is on hemodialysis through  left IJ catheter  Physical Examination  Filed Vitals:   03/23/11 1331  BP: 152/71  Pulse: 92  Resp: 20    WDWN male in NAD.  right upper extremity Incision is healed Skin color is normal   Hand grip is 5/5 and sensation in digits is intact; There is a good thrill and good bruit in the RUA AVGG. The graft/fistula is easily palpable and of adequate size  Assessment/Plan [name] [name] is a 45 y.o. year old who is s/p creation/revision of left upper extremity Hemodialysis access. Follow-up as needed  The patient's access will be ready for use in 2 weeks after 04/07/11  Clinic MD: CS Scot Dock, MD

## 2011-04-08 ENCOUNTER — Other Ambulatory Visit (HOSPITAL_COMMUNITY): Payer: Self-pay | Admitting: Nephrology

## 2011-04-08 DIAGNOSIS — N186 End stage renal disease: Secondary | ICD-10-CM

## 2011-04-20 DIAGNOSIS — N186 End stage renal disease: Secondary | ICD-10-CM | POA: Diagnosis not present

## 2011-04-20 DIAGNOSIS — N2581 Secondary hyperparathyroidism of renal origin: Secondary | ICD-10-CM | POA: Diagnosis not present

## 2011-04-20 DIAGNOSIS — D509 Iron deficiency anemia, unspecified: Secondary | ICD-10-CM | POA: Diagnosis not present

## 2011-04-22 DIAGNOSIS — N186 End stage renal disease: Secondary | ICD-10-CM | POA: Diagnosis not present

## 2011-04-22 DIAGNOSIS — D509 Iron deficiency anemia, unspecified: Secondary | ICD-10-CM | POA: Diagnosis not present

## 2011-04-22 DIAGNOSIS — N2581 Secondary hyperparathyroidism of renal origin: Secondary | ICD-10-CM | POA: Diagnosis not present

## 2011-04-25 DIAGNOSIS — D509 Iron deficiency anemia, unspecified: Secondary | ICD-10-CM | POA: Diagnosis not present

## 2011-04-25 DIAGNOSIS — N186 End stage renal disease: Secondary | ICD-10-CM | POA: Diagnosis not present

## 2011-04-25 DIAGNOSIS — N2581 Secondary hyperparathyroidism of renal origin: Secondary | ICD-10-CM | POA: Diagnosis not present

## 2011-04-26 ENCOUNTER — Encounter (HOSPITAL_COMMUNITY): Payer: Self-pay | Admitting: Pharmacy Technician

## 2011-04-27 DIAGNOSIS — N2581 Secondary hyperparathyroidism of renal origin: Secondary | ICD-10-CM | POA: Diagnosis not present

## 2011-04-27 DIAGNOSIS — D509 Iron deficiency anemia, unspecified: Secondary | ICD-10-CM | POA: Diagnosis not present

## 2011-04-27 DIAGNOSIS — N186 End stage renal disease: Secondary | ICD-10-CM | POA: Diagnosis not present

## 2011-04-28 ENCOUNTER — Ambulatory Visit (HOSPITAL_COMMUNITY)
Admission: RE | Admit: 2011-04-28 | Discharge: 2011-04-28 | Disposition: A | Discharge status: Home / Self Care | Payer: Medicare Other | Source: Ambulatory Visit | Attending: Nephrology | Admitting: Nephrology

## 2011-04-28 DIAGNOSIS — N186 End stage renal disease: Secondary | ICD-10-CM

## 2011-04-28 DIAGNOSIS — N289 Disorder of kidney and ureter, unspecified: Secondary | ICD-10-CM | POA: Diagnosis not present

## 2011-04-28 DIAGNOSIS — Z4901 Encounter for fitting and adjustment of extracorporeal dialysis catheter: Secondary | ICD-10-CM | POA: Insufficient documentation

## 2011-04-28 DIAGNOSIS — Z5189 Encounter for other specified aftercare: Secondary | ICD-10-CM | POA: Diagnosis not present

## 2011-04-28 MED ORDER — CHLORHEXIDINE GLUCONATE 4 % EX LIQD
CUTANEOUS | Status: AC
Start: 2011-04-28 — End: 2011-04-28
  Filled 2011-04-28: qty 45

## 2011-04-29 DIAGNOSIS — N2581 Secondary hyperparathyroidism of renal origin: Secondary | ICD-10-CM | POA: Diagnosis not present

## 2011-04-29 DIAGNOSIS — N186 End stage renal disease: Secondary | ICD-10-CM | POA: Diagnosis not present

## 2011-04-29 DIAGNOSIS — D509 Iron deficiency anemia, unspecified: Secondary | ICD-10-CM | POA: Diagnosis not present

## 2011-05-02 DIAGNOSIS — D509 Iron deficiency anemia, unspecified: Secondary | ICD-10-CM | POA: Diagnosis not present

## 2011-05-02 DIAGNOSIS — N2581 Secondary hyperparathyroidism of renal origin: Secondary | ICD-10-CM | POA: Diagnosis not present

## 2011-05-02 DIAGNOSIS — N186 End stage renal disease: Secondary | ICD-10-CM | POA: Diagnosis not present

## 2011-05-04 DIAGNOSIS — N2581 Secondary hyperparathyroidism of renal origin: Secondary | ICD-10-CM | POA: Diagnosis not present

## 2011-05-04 DIAGNOSIS — N186 End stage renal disease: Secondary | ICD-10-CM | POA: Diagnosis not present

## 2011-05-04 DIAGNOSIS — D509 Iron deficiency anemia, unspecified: Secondary | ICD-10-CM | POA: Diagnosis not present

## 2011-05-06 DIAGNOSIS — N186 End stage renal disease: Secondary | ICD-10-CM | POA: Diagnosis not present

## 2011-05-06 DIAGNOSIS — D509 Iron deficiency anemia, unspecified: Secondary | ICD-10-CM | POA: Diagnosis not present

## 2011-05-06 DIAGNOSIS — N2581 Secondary hyperparathyroidism of renal origin: Secondary | ICD-10-CM | POA: Diagnosis not present

## 2011-05-09 DIAGNOSIS — D509 Iron deficiency anemia, unspecified: Secondary | ICD-10-CM | POA: Diagnosis not present

## 2011-05-09 DIAGNOSIS — N2581 Secondary hyperparathyroidism of renal origin: Secondary | ICD-10-CM | POA: Diagnosis not present

## 2011-05-09 DIAGNOSIS — N186 End stage renal disease: Secondary | ICD-10-CM | POA: Diagnosis not present

## 2011-05-11 DIAGNOSIS — N186 End stage renal disease: Secondary | ICD-10-CM | POA: Diagnosis not present

## 2011-05-11 DIAGNOSIS — N2581 Secondary hyperparathyroidism of renal origin: Secondary | ICD-10-CM | POA: Diagnosis not present

## 2011-05-11 DIAGNOSIS — D509 Iron deficiency anemia, unspecified: Secondary | ICD-10-CM | POA: Diagnosis not present

## 2011-05-13 DIAGNOSIS — N186 End stage renal disease: Secondary | ICD-10-CM | POA: Diagnosis not present

## 2011-05-13 DIAGNOSIS — N2581 Secondary hyperparathyroidism of renal origin: Secondary | ICD-10-CM | POA: Diagnosis not present

## 2011-05-13 DIAGNOSIS — D509 Iron deficiency anemia, unspecified: Secondary | ICD-10-CM | POA: Diagnosis not present

## 2011-05-16 DIAGNOSIS — N186 End stage renal disease: Secondary | ICD-10-CM | POA: Diagnosis not present

## 2011-05-16 DIAGNOSIS — N2581 Secondary hyperparathyroidism of renal origin: Secondary | ICD-10-CM | POA: Diagnosis not present

## 2011-05-16 DIAGNOSIS — D509 Iron deficiency anemia, unspecified: Secondary | ICD-10-CM | POA: Diagnosis not present

## 2011-05-18 DIAGNOSIS — N2581 Secondary hyperparathyroidism of renal origin: Secondary | ICD-10-CM | POA: Diagnosis not present

## 2011-05-18 DIAGNOSIS — N186 End stage renal disease: Secondary | ICD-10-CM | POA: Diagnosis not present

## 2011-05-18 DIAGNOSIS — D509 Iron deficiency anemia, unspecified: Secondary | ICD-10-CM | POA: Diagnosis not present

## 2011-05-19 DIAGNOSIS — N186 End stage renal disease: Secondary | ICD-10-CM | POA: Diagnosis not present

## 2011-05-20 DIAGNOSIS — N2581 Secondary hyperparathyroidism of renal origin: Secondary | ICD-10-CM | POA: Diagnosis not present

## 2011-05-20 DIAGNOSIS — D509 Iron deficiency anemia, unspecified: Secondary | ICD-10-CM | POA: Diagnosis not present

## 2011-05-20 DIAGNOSIS — N186 End stage renal disease: Secondary | ICD-10-CM | POA: Diagnosis not present

## 2011-05-23 DIAGNOSIS — N186 End stage renal disease: Secondary | ICD-10-CM | POA: Diagnosis not present

## 2011-05-23 DIAGNOSIS — N2581 Secondary hyperparathyroidism of renal origin: Secondary | ICD-10-CM | POA: Diagnosis not present

## 2011-05-23 DIAGNOSIS — D509 Iron deficiency anemia, unspecified: Secondary | ICD-10-CM | POA: Diagnosis not present

## 2011-05-25 DIAGNOSIS — N186 End stage renal disease: Secondary | ICD-10-CM | POA: Diagnosis not present

## 2011-05-25 DIAGNOSIS — D509 Iron deficiency anemia, unspecified: Secondary | ICD-10-CM | POA: Diagnosis not present

## 2011-05-25 DIAGNOSIS — N2581 Secondary hyperparathyroidism of renal origin: Secondary | ICD-10-CM | POA: Diagnosis not present

## 2011-05-27 DIAGNOSIS — N186 End stage renal disease: Secondary | ICD-10-CM | POA: Diagnosis not present

## 2011-05-27 DIAGNOSIS — N2581 Secondary hyperparathyroidism of renal origin: Secondary | ICD-10-CM | POA: Diagnosis not present

## 2011-05-27 DIAGNOSIS — D509 Iron deficiency anemia, unspecified: Secondary | ICD-10-CM | POA: Diagnosis not present

## 2011-05-30 DIAGNOSIS — N2581 Secondary hyperparathyroidism of renal origin: Secondary | ICD-10-CM | POA: Diagnosis not present

## 2011-05-30 DIAGNOSIS — D509 Iron deficiency anemia, unspecified: Secondary | ICD-10-CM | POA: Diagnosis not present

## 2011-05-30 DIAGNOSIS — N186 End stage renal disease: Secondary | ICD-10-CM | POA: Diagnosis not present

## 2011-06-01 ENCOUNTER — Encounter (HOSPITAL_COMMUNITY): Payer: Self-pay | Admitting: Certified Registered"

## 2011-06-01 ENCOUNTER — Ambulatory Visit (HOSPITAL_COMMUNITY)
Admission: AD | Admit: 2011-06-01 | Discharge: 2011-06-01 | Disposition: A | Discharge status: Home / Self Care | Payer: Medicare Other | Source: Ambulatory Visit | Attending: Vascular Surgery | Admitting: Vascular Surgery

## 2011-06-01 ENCOUNTER — Inpatient Hospital Stay (HOSPITAL_COMMUNITY): Payer: Medicare Other | Admitting: Certified Registered"

## 2011-06-01 ENCOUNTER — Encounter (HOSPITAL_COMMUNITY)
Admission: AD | Disposition: A | Discharge status: Home / Self Care | Payer: Self-pay | Source: Ambulatory Visit | Attending: Vascular Surgery

## 2011-06-01 ENCOUNTER — Other Ambulatory Visit: Payer: Self-pay | Admitting: *Deleted

## 2011-06-01 ENCOUNTER — Encounter (HOSPITAL_COMMUNITY): Payer: Self-pay | Admitting: *Deleted

## 2011-06-01 DIAGNOSIS — I120 Hypertensive chronic kidney disease with stage 5 chronic kidney disease or end stage renal disease: Secondary | ICD-10-CM | POA: Insufficient documentation

## 2011-06-01 DIAGNOSIS — I509 Heart failure, unspecified: Secondary | ICD-10-CM | POA: Diagnosis not present

## 2011-06-01 DIAGNOSIS — N186 End stage renal disease: Secondary | ICD-10-CM | POA: Insufficient documentation

## 2011-06-01 DIAGNOSIS — T82898A Other specified complication of vascular prosthetic devices, implants and grafts, initial encounter: Secondary | ICD-10-CM | POA: Insufficient documentation

## 2011-06-01 DIAGNOSIS — I10 Essential (primary) hypertension: Secondary | ICD-10-CM | POA: Diagnosis not present

## 2011-06-01 DIAGNOSIS — T82598A Other mechanical complication of other cardiac and vascular devices and implants, initial encounter: Secondary | ICD-10-CM | POA: Diagnosis not present

## 2011-06-01 DIAGNOSIS — Y832 Surgical operation with anastomosis, bypass or graft as the cause of abnormal reaction of the patient, or of later complication, without mention of misadventure at the time of the procedure: Secondary | ICD-10-CM | POA: Insufficient documentation

## 2011-06-01 DIAGNOSIS — Z992 Dependence on renal dialysis: Secondary | ICD-10-CM | POA: Insufficient documentation

## 2011-06-01 LAB — POCT I-STAT 4, (NA,K, GLUC, HGB,HCT)
Glucose, Bld: 79 mg/dL (ref 70–99)
HCT: 39 % (ref 39.0–52.0)
Potassium: 4.7 mEq/L (ref 3.5–5.1)

## 2011-06-01 LAB — PROTIME-INR: Prothrombin Time: 13.2 seconds (ref 11.6–15.2)

## 2011-06-01 SURGERY — THROMBECTOMY AND REVISION OF ARTERIOVENTOUS (AV) GORETEX  GRAFT
Anesthesia: Monitor Anesthesia Care | Site: Arm Upper | Laterality: Right | Wound class: Clean

## 2011-06-01 MED ORDER — SODIUM CHLORIDE 0.9 % IV SOLN
INTRAVENOUS | Status: DC | PRN
  Administered 2011-06-01 (×2): via INTRAVENOUS

## 2011-06-01 MED ORDER — LIDOCAINE-EPINEPHRINE (PF) 1 %-1:200000 IJ SOLN
INTRAMUSCULAR | Status: DC | PRN
  Administered 2011-06-01: 24 mL

## 2011-06-01 MED ORDER — MORPHINE SULFATE 4 MG/ML IJ SOLN: 0.0500 mg/kg | INTRAMUSCULAR | Status: DC | PRN

## 2011-06-01 MED ORDER — MIDAZOLAM HCL 5 MG/5ML IJ SOLN
INTRAMUSCULAR | Status: DC | PRN
  Administered 2011-06-01: 2 mg via INTRAVENOUS
  Administered 2011-06-01: 1 mg via INTRAVENOUS

## 2011-06-01 MED ORDER — HYDROMORPHONE HCL PF 1 MG/ML IJ SOLN: 0.2500 mg | INTRAMUSCULAR | Status: DC | PRN

## 2011-06-01 MED ORDER — FENTANYL CITRATE 0.05 MG/ML IJ SOLN
INTRAMUSCULAR | Status: DC | PRN
  Administered 2011-06-01: 50 ug via INTRAVENOUS

## 2011-06-01 MED ORDER — MEPERIDINE HCL 25 MG/ML IJ SOLN: 6.2500 mg | INTRAMUSCULAR | Status: DC | PRN

## 2011-06-01 MED ORDER — OXYCODONE-ACETAMINOPHEN 7.5-325 MG PO TABS: 1.0000 | ORAL_TABLET | ORAL | Status: AC | PRN

## 2011-06-01 MED ORDER — PROTAMINE SULFATE 10 MG/ML IV SOLN
INTRAVENOUS | Status: DC | PRN
  Administered 2011-06-01: 50 mg via INTRAVENOUS

## 2011-06-01 MED ORDER — HEPARIN SODIUM (PORCINE) 1000 UNIT/ML IJ SOLN
INTRAMUSCULAR | Status: DC | PRN
  Administered 2011-06-01: 10 mL via INTRAVENOUS

## 2011-06-01 MED ORDER — 0.9 % SODIUM CHLORIDE (POUR BTL) OPTIME
TOPICAL | Status: DC | PRN
  Administered 2011-06-01: 1000 mL

## 2011-06-01 MED ORDER — PROMETHAZINE HCL 25 MG/ML IJ SOLN: 6.2500 mg | INTRAMUSCULAR | Status: DC | PRN

## 2011-06-01 MED ORDER — MUPIROCIN 2 % EX OINT
TOPICAL_OINTMENT | CUTANEOUS | Status: AC
  Filled 2011-06-01: qty 22

## 2011-06-01 MED ORDER — PROPOFOL 10 MG/ML IV EMUL
INTRAVENOUS | Status: DC | PRN
  Administered 2011-06-01: 100 ug/kg/min via INTRAVENOUS

## 2011-06-01 MED ORDER — SODIUM CHLORIDE 0.9 % IV SOLN
INTRAVENOUS | Status: DC
  Administered 2011-06-01: 11:00:00 via INTRAVENOUS

## 2011-06-01 MED ORDER — DEXTROSE 5 % IV SOLN
1.5000 g | INTRAVENOUS | Status: AC
  Administered 2011-06-01: 1.5 g via INTRAVENOUS
  Filled 2011-06-01: qty 1.5

## 2011-06-01 MED ORDER — SODIUM CHLORIDE 0.9 % IR SOLN
Status: DC | PRN
  Administered 2011-06-01: 15:00:00

## 2011-06-01 MED ORDER — MIDAZOLAM HCL 2 MG/2ML IJ SOLN: 0.5000 mg | Freq: Once | INTRAMUSCULAR | Status: DC | PRN

## 2011-06-01 MED ORDER — METOCLOPRAMIDE HCL 5 MG/ML IJ SOLN: 10.0000 mg | Freq: Once | INTRAMUSCULAR | Status: DC | PRN

## 2011-06-01 SURGICAL SUPPLY — 35 items
ADH SKN CLS APL DERMABOND .7 (GAUZE/BANDAGES/DRESSINGS) ×1
CANISTER SUCTION 2500CC (MISCELLANEOUS) ×2 IMPLANT
CATH EMB 5FR 80CM (CATHETERS) ×2 IMPLANT
CLIP TI MEDIUM 6 (CLIP) ×2 IMPLANT
CLIP TI WIDE RED SMALL 6 (CLIP) ×2 IMPLANT
CLOTH BEACON ORANGE TIMEOUT ST (SAFETY) ×2 IMPLANT
COVER SURGICAL LIGHT HANDLE (MISCELLANEOUS) ×4 IMPLANT
DERMABOND ADVANCED (GAUZE/BANDAGES/DRESSINGS) ×1
DERMABOND ADVANCED .7 DNX12 (GAUZE/BANDAGES/DRESSINGS) ×1 IMPLANT
ELECT REM PT RETURN 9FT ADLT (ELECTROSURGICAL) ×2
ELECTRODE REM PT RTRN 9FT ADLT (ELECTROSURGICAL) ×1 IMPLANT
GEL ULTRASOUND 20GR AQUASONIC (MISCELLANEOUS) IMPLANT
GLOVE SS BIOGEL STRL SZ 7 (GLOVE) IMPLANT
GLOVE SUPERSENSE BIOGEL SZ 7 (GLOVE)
GOWN STRL NON-REIN LRG LVL3 (GOWN DISPOSABLE) ×4 IMPLANT
GRAFT GORETEX STND 4X7 (Vascular Products) ×2 IMPLANT
GRAFT GORETEXSTD 4X7 (Vascular Products) ×1 IMPLANT
KIT BASIN OR (CUSTOM PROCEDURE TRAY) ×2 IMPLANT
KIT ROOM TURNOVER OR (KITS) ×2 IMPLANT
NS IRRIG 1000ML POUR BTL (IV SOLUTION) ×2 IMPLANT
PACK CV ACCESS (CUSTOM PROCEDURE TRAY) ×2 IMPLANT
PAD ARMBOARD 7.5X6 YLW CONV (MISCELLANEOUS) ×4 IMPLANT
SPONGE GAUZE 4X4 12PLY (GAUZE/BANDAGES/DRESSINGS) ×2 IMPLANT
SUT PROLENE 5 0 C 1 24 (SUTURE) ×2 IMPLANT
SUT PROLENE 5 0 C 1 36 (SUTURE) ×2 IMPLANT
SUT PROLENE 6 0 BV (SUTURE) ×4 IMPLANT
SUT PROLENE 6 0 CC (SUTURE) ×2 IMPLANT
SUT VIC AB 3-0 SH 27 (SUTURE) ×2
SUT VIC AB 3-0 SH 27X BRD (SUTURE) ×1 IMPLANT
SUT VICRYL 4-0 PS2 18IN ABS (SUTURE) ×2 IMPLANT
TAPE STRIPS DRAPE STRL (GAUZE/BANDAGES/DRESSINGS) ×2 IMPLANT
TOWEL OR 17X24 6PK STRL BLUE (TOWEL DISPOSABLE) ×4 IMPLANT
TOWEL OR 17X26 10 PK STRL BLUE (TOWEL DISPOSABLE) ×2 IMPLANT
UNDERPAD 30X30 INCONTINENT (UNDERPADS AND DIAPERS) ×2 IMPLANT
WATER STERILE IRR 1000ML POUR (IV SOLUTION) ×2 IMPLANT

## 2011-06-01 NOTE — Op Note (Signed)
Procedure: Thrombectomy and revision of right upper arm AV graft  Preoperative diagnosis: Thrombosed AV graft right arm  Postoperative diagnosis: Same  Anesthesia: Local with IV sedation  Assistant: Evorn Gong PA-C  Operative findings: Greater than 8 mm outflow vein, revised to 7 mm interposition graft  Operative details: After obtaining informed consent, the patient was taken to the operating room. The patient was placed in supine position on the operating room table. After adequate sedation, the patient's entire right upper extremity was prepped and draped in the usual sterile fashion. Local anesthesia was infiltrated at a pre-existing scar in the axilla. A longitudinal incision was made in this location through a pre-existing scar. The incision was carried into the subcutaneous tissues down to level the graft. The graft was dissected free circumferentially. Dissection was carried down to the level of the venous anastomosis. This was a very large axillary vein between 8 and 10 mm in diameter. This was dissected free circumferentially. The patient was given 10,000 units of intravenous heparin. A transverse graftotomy was made just above the level of the anastomosis. A #4 Fogarty catheter was used to thrombectomize the venous limb of the graft. There was some venous backbleeding but there were still intimal hyperplasia within the vein and the vein was quite thickened. Therefore the vein was opened longitudinally past the segment intimal hyperplasia. The thickened portion of vein was debrided away. The arterial limb the graft was then thrombectomized with a #4 Fogarty catheter excellent arterial inflow was obtained. This was clamped proximally with a fistula clamp. A new 7 mm interposition graft was brought up in the operative field and sewn end-to-end to the vein using a running 5-0 Prolene suture. At completion of the anastomosis the venous limb was thoroughly flushed with heparinized saline and  reoccluded. The graft was cut to length in preparation for sewing to the proximal graft. This was then sewn end-to-end to the proximal arterial limb of the graft using running 6-0 Prolene suture. Just prior to completion, the anastomosis was forebled backbled and thoroughly flushed. The anastomosis was secured; clamps were released; and there was a palpable thrill in the graft immediately. Hemostasis was obtained with direct pressure and the assistance of 50 mg of protamine. The subcutaneous tissues were reapproximated with running 3-0 Vicryl suture. The skin was closed with a 4 0 Vicryl subcuticular stitch. Dermabond was applied the incision. The patient tolerated the procedure well and there were no complications. Instrument sponge and needle counts were correct at the end of the case. The patient was taken to the recovery room in stable condition.  Ruta Hinds, MD [facility] Office: [phone] Pager: [phone]

## 2011-06-01 NOTE — Discharge Instructions (Signed)
No diagram  06/01/2011 [name] [name] [id number] 25-Feb-1966  Surgeon(s): Elam Dutch, MD  Procedure(s): THROMBECTOMY AND REVISION OF ARTERIOVENTOUS (AV) GORETEX  GRAFT Right upper arm  Comments: none   May stick graft immediately

## 2011-06-01 NOTE — Transfer of Care (Signed)
Immediate Anesthesia Transfer of Care Note  Patient: [name] [name]  Procedure(s) Performed: Procedure(s) (LRB): THROMBECTOMY AND REVISION OF ARTERIOVENTOUS (AV) GORETEX  GRAFT (Right)  Patient Location: PACU  Anesthesia Type: MAC  Level of Consciousness: awake, alert  and patient cooperative  Airway & Oxygen Therapy: Patient Spontanous Breathing  Post-op Assessment: Report given to PACU RN  Post vital signs: Reviewed and stable  Complications: No apparent anesthesia complications

## 2011-06-01 NOTE — H&P (Signed)
[facility] H&P  CC:  Clotted right arm graft  HPI: Clotted right arm graft first time  Past Medical History  Diagnosis Date  . Hypertension   . Orthostatic hypotension      2008  . Chest discomfort      2008  . Sinus tachycardia      2008, TSH normal  . Ejection fraction      65%, echo, 2008   /   EF 55-60%, echo, October 28, 2010  . Overweight   . Syncope      positional after dialysis... 2008  . Aneurysm      Right arm fistula 3 aneurysms 2011,   plans to have a new procedure  . CHF (congestive heart failure)   . Leg pain   . Angina     occasional, last 6 mo ago  . Dialysis patient     M-W-F @ [facility]  . ESRD (end stage renal disease)     on hemodialysis M-W-F [facility]    FH:  Non-Contributory  Social HX History  Substance Use Topics  . Smoking status: Former Smoker    Types: Cigarettes    Quit date: 04/18/1994  . Smokeless tobacco: Current User    Types: Snuff   Comment: dips 1/2 snuff per day times 25 years  . Alcohol Use: No    Allergies No Known Allergies  Medications Current Facility-Administered Medications  Medication Dose Route Frequency Provider Last Rate Last Dose  . 0.9 %  sodium chloride infusion   Intravenous Continuous Tinnie Gens, MD 20 mL/hr at 06/01/11 1056    . cefUROXime (ZINACEF) 1.5 g in dextrose 5 % 50 mL IVPB  1.5 g Intravenous On Call to OR Tinnie Gens, MD      . HYDROmorphone (DILAUDID) injection 0.25-0.5 mg  0.25-0.5 mg Intravenous Q5 min PRN Finis Bud, MD      . meperidine (DEMEROL) injection 6.25-12.5 mg  6.25-12.5 mg Intravenous PRN Finis Bud, MD      . midazolam (VERSED) injection 0.5-2 mg  0.5-2 mg Intravenous Once PRN Finis Bud, MD      . morphine 4 MG/ML injection 7.88 mg  0.05 mg/kg Intravenous Q10 min PRN Finis Bud, MD      . mupirocin ointment (BACTROBAN) 2 %           . promethazine (PHENERGAN) injection 6.25-12.5 mg  6.25-12.5 mg Intravenous Q15 min PRN  Finis Bud, MD       Facility-Administered Medications Ordered in Other Encounters  Medication Dose Route Frequency Provider Last Rate Last Dose  . 0.9 %  sodium chloride infusion    Continuous PRN Moshe Salisbury, CRNA      . midazolam (VERSED) 5 MG/5ML injection    PRN Moshe Salisbury, CRNA   2 mg at 06/01/11 1129    PHYSICAL EXAM  Filed Vitals:   06/01/11 1014  BP: 127/84  Pulse: 87  Temp: 98.7 F (37.1 C)  Resp: 20    General:  WDWN in NAD HENT: WNL Eyes: Pupils equal Pulmonary: normal non-labored breathing , without Rales, rhonchi,  wheezing Cardiac: RRR, Vascular Exam/Pulses:  2+ right radial pulse, thrombosed right upper arm graft, thrombosed right forearm graft  Extremities without ischemic changes, no Gangrene , no cellulitis; no open wounds;  Neuro A&O x 3; good sensation; motion in all extremities  Impression: Clotted right arm AV graft  Plan: Thrombectomy/Revision right arm AV graft  Niemah Schwebke E @TODAY @ 1:37 PM

## 2011-06-01 NOTE — Anesthesia Preprocedure Evaluation (Addendum)
Anesthesia Evaluation  Patient identified by MRN, date of birth, ID band Patient awake    Reviewed: Allergy & Precautions, H&P , NPO status , Patient's Chart, lab work & pertinent test results, reviewed documented beta blocker date and time   Airway Mallampati: II TM Distance: >3 FB Neck ROM: full    Dental   Pulmonary neg pulmonary ROS,          Cardiovascular hypertension, + angina with exertion +CHF     Neuro/Psych Negative Neurological ROS  Negative Psych ROS   GI/Hepatic negative GI ROS, Neg liver ROS,   Endo/Other  Negative Endocrine ROSMorbid obesity  Renal/GU CRF and Dialysis  Genitourinary negative   Musculoskeletal   Abdominal   Peds  Hematology negative hematology ROS (+)   Anesthesia Other Findings See surgeon's H&P   Reproductive/Obstetrics negative OB ROS                           Anesthesia Physical Anesthesia Plan  ASA: III  Anesthesia Plan: MAC   Post-op Pain Management:    Induction: Intravenous  Airway Management Planned: Simple Face Mask  Additional Equipment:   Intra-op Plan:   Post-operative Plan:   Informed Consent: I have reviewed the patients History and Physical, chart, labs and discussed the procedure including the risks, benefits and alternatives for the proposed anesthesia with the patient or authorized representative who has indicated his/her understanding and acceptance.     Plan Discussed with: CRNA and Surgeon  Anesthesia Plan Comments:         Anesthesia Quick Evaluation

## 2011-06-01 NOTE — Preoperative (Signed)
Beta Blockers   Reason not to administer Beta Blockers:Not Applicable 

## 2011-06-01 NOTE — Progress Notes (Signed)
Pharmacy notified and med rec updated

## 2011-06-01 NOTE — Progress Notes (Signed)
Med rec tech called , complete not in computer.

## 2011-06-01 NOTE — Anesthesia Postprocedure Evaluation (Signed)
  Anesthesia Post-op Note  Patient: [name] [name]  Procedure(s) Performed: Procedure(s) (LRB): THROMBECTOMY AND REVISION OF ARTERIOVENTOUS (AV) GORETEX  GRAFT (Right)  Patient Location: PACU  Anesthesia Type: MAC  Level of Consciousness: awake  Airway and Oxygen Therapy: Patient Spontanous Breathing  Post-op Pain: none  Post-op Assessment: Post-op Vital signs reviewed  Post-op Vital Signs: stable  Complications: No apparent anesthesia complications

## 2011-06-01 NOTE — Progress Notes (Signed)
pcxr  In epic

## 2011-06-01 NOTE — Progress Notes (Signed)
Positive for bruit and thrill ... Dressing dry  Intact.... Clean ... Axilla  upper right extremity.   Cap refill normal . .  Wife at bedside.

## 2011-06-03 DIAGNOSIS — D509 Iron deficiency anemia, unspecified: Secondary | ICD-10-CM | POA: Diagnosis not present

## 2011-06-03 DIAGNOSIS — N2581 Secondary hyperparathyroidism of renal origin: Secondary | ICD-10-CM | POA: Diagnosis not present

## 2011-06-03 DIAGNOSIS — N186 End stage renal disease: Secondary | ICD-10-CM | POA: Diagnosis not present

## 2011-06-06 DIAGNOSIS — N186 End stage renal disease: Secondary | ICD-10-CM | POA: Diagnosis not present

## 2011-06-06 DIAGNOSIS — N2581 Secondary hyperparathyroidism of renal origin: Secondary | ICD-10-CM | POA: Diagnosis not present

## 2011-06-06 DIAGNOSIS — D509 Iron deficiency anemia, unspecified: Secondary | ICD-10-CM | POA: Diagnosis not present

## 2011-06-08 DIAGNOSIS — D509 Iron deficiency anemia, unspecified: Secondary | ICD-10-CM | POA: Diagnosis not present

## 2011-06-08 DIAGNOSIS — N186 End stage renal disease: Secondary | ICD-10-CM | POA: Diagnosis not present

## 2011-06-08 DIAGNOSIS — N2581 Secondary hyperparathyroidism of renal origin: Secondary | ICD-10-CM | POA: Diagnosis not present

## 2011-06-10 DIAGNOSIS — N186 End stage renal disease: Secondary | ICD-10-CM | POA: Diagnosis not present

## 2011-06-10 DIAGNOSIS — D509 Iron deficiency anemia, unspecified: Secondary | ICD-10-CM | POA: Diagnosis not present

## 2011-06-10 DIAGNOSIS — N2581 Secondary hyperparathyroidism of renal origin: Secondary | ICD-10-CM | POA: Diagnosis not present

## 2011-06-13 DIAGNOSIS — N186 End stage renal disease: Secondary | ICD-10-CM | POA: Diagnosis not present

## 2011-06-13 DIAGNOSIS — D509 Iron deficiency anemia, unspecified: Secondary | ICD-10-CM | POA: Diagnosis not present

## 2011-06-13 DIAGNOSIS — N2581 Secondary hyperparathyroidism of renal origin: Secondary | ICD-10-CM | POA: Diagnosis not present

## 2011-06-15 DIAGNOSIS — N186 End stage renal disease: Secondary | ICD-10-CM | POA: Diagnosis not present

## 2011-06-15 DIAGNOSIS — N2581 Secondary hyperparathyroidism of renal origin: Secondary | ICD-10-CM | POA: Diagnosis not present

## 2011-06-15 DIAGNOSIS — D509 Iron deficiency anemia, unspecified: Secondary | ICD-10-CM | POA: Diagnosis not present

## 2011-06-16 DIAGNOSIS — N186 End stage renal disease: Secondary | ICD-10-CM | POA: Diagnosis not present

## 2011-06-17 DIAGNOSIS — N186 End stage renal disease: Secondary | ICD-10-CM | POA: Diagnosis not present

## 2011-06-17 DIAGNOSIS — Z992 Dependence on renal dialysis: Secondary | ICD-10-CM | POA: Diagnosis not present

## 2011-06-17 DIAGNOSIS — D509 Iron deficiency anemia, unspecified: Secondary | ICD-10-CM | POA: Diagnosis not present

## 2011-06-17 DIAGNOSIS — N2581 Secondary hyperparathyroidism of renal origin: Secondary | ICD-10-CM | POA: Diagnosis not present

## 2011-06-20 DIAGNOSIS — N186 End stage renal disease: Secondary | ICD-10-CM | POA: Diagnosis not present

## 2011-06-20 DIAGNOSIS — Z992 Dependence on renal dialysis: Secondary | ICD-10-CM | POA: Diagnosis not present

## 2011-06-20 DIAGNOSIS — D509 Iron deficiency anemia, unspecified: Secondary | ICD-10-CM | POA: Diagnosis not present

## 2011-06-20 DIAGNOSIS — N2581 Secondary hyperparathyroidism of renal origin: Secondary | ICD-10-CM | POA: Diagnosis not present

## 2011-06-22 DIAGNOSIS — N186 End stage renal disease: Secondary | ICD-10-CM | POA: Diagnosis not present

## 2011-06-22 DIAGNOSIS — N2581 Secondary hyperparathyroidism of renal origin: Secondary | ICD-10-CM | POA: Diagnosis not present

## 2011-06-22 DIAGNOSIS — Z992 Dependence on renal dialysis: Secondary | ICD-10-CM | POA: Diagnosis not present

## 2011-06-22 DIAGNOSIS — I10 Essential (primary) hypertension: Secondary | ICD-10-CM | POA: Diagnosis not present

## 2011-06-22 DIAGNOSIS — D509 Iron deficiency anemia, unspecified: Secondary | ICD-10-CM | POA: Diagnosis not present

## 2011-06-24 DIAGNOSIS — N186 End stage renal disease: Secondary | ICD-10-CM | POA: Diagnosis not present

## 2011-06-24 DIAGNOSIS — N2581 Secondary hyperparathyroidism of renal origin: Secondary | ICD-10-CM | POA: Diagnosis not present

## 2011-06-24 DIAGNOSIS — Z992 Dependence on renal dialysis: Secondary | ICD-10-CM | POA: Diagnosis not present

## 2011-06-24 DIAGNOSIS — D509 Iron deficiency anemia, unspecified: Secondary | ICD-10-CM | POA: Diagnosis not present

## 2011-06-27 ENCOUNTER — Telehealth: Payer: Self-pay | Admitting: Cardiology

## 2011-06-27 DIAGNOSIS — Z992 Dependence on renal dialysis: Secondary | ICD-10-CM | POA: Diagnosis not present

## 2011-06-27 DIAGNOSIS — N2581 Secondary hyperparathyroidism of renal origin: Secondary | ICD-10-CM | POA: Diagnosis not present

## 2011-06-27 DIAGNOSIS — N186 End stage renal disease: Secondary | ICD-10-CM | POA: Diagnosis not present

## 2011-06-27 DIAGNOSIS — D509 Iron deficiency anemia, unspecified: Secondary | ICD-10-CM | POA: Diagnosis not present

## 2011-06-27 NOTE — Telephone Encounter (Signed)
Requesting samples of BP meds. Notified we do not keep samples of BP meds in the office. Also, notified pt has been non-compliant with follow up and needs to re-establish with Dr. Ron Parker. Pt was due for f/u in August but cancelled 2 appts and no showed for 1 appt. Pt scheduled with Dr. Ron Parker for April and [place] notified of appt information.

## 2011-06-29 DIAGNOSIS — Z992 Dependence on renal dialysis: Secondary | ICD-10-CM | POA: Diagnosis not present

## 2011-06-29 DIAGNOSIS — N2581 Secondary hyperparathyroidism of renal origin: Secondary | ICD-10-CM | POA: Diagnosis not present

## 2011-06-29 DIAGNOSIS — D509 Iron deficiency anemia, unspecified: Secondary | ICD-10-CM | POA: Diagnosis not present

## 2011-06-29 DIAGNOSIS — N186 End stage renal disease: Secondary | ICD-10-CM | POA: Diagnosis not present

## 2011-07-01 DIAGNOSIS — N2581 Secondary hyperparathyroidism of renal origin: Secondary | ICD-10-CM | POA: Diagnosis not present

## 2011-07-01 DIAGNOSIS — D509 Iron deficiency anemia, unspecified: Secondary | ICD-10-CM | POA: Diagnosis not present

## 2011-07-01 DIAGNOSIS — N186 End stage renal disease: Secondary | ICD-10-CM | POA: Diagnosis not present

## 2011-07-04 DIAGNOSIS — N186 End stage renal disease: Secondary | ICD-10-CM | POA: Diagnosis not present

## 2011-07-04 DIAGNOSIS — D509 Iron deficiency anemia, unspecified: Secondary | ICD-10-CM | POA: Diagnosis not present

## 2011-07-04 DIAGNOSIS — N2581 Secondary hyperparathyroidism of renal origin: Secondary | ICD-10-CM | POA: Diagnosis not present

## 2011-07-06 DIAGNOSIS — D509 Iron deficiency anemia, unspecified: Secondary | ICD-10-CM | POA: Diagnosis not present

## 2011-07-06 DIAGNOSIS — N186 End stage renal disease: Secondary | ICD-10-CM | POA: Diagnosis not present

## 2011-07-06 DIAGNOSIS — N2581 Secondary hyperparathyroidism of renal origin: Secondary | ICD-10-CM | POA: Diagnosis not present

## 2011-07-08 DIAGNOSIS — N2581 Secondary hyperparathyroidism of renal origin: Secondary | ICD-10-CM | POA: Diagnosis not present

## 2011-07-08 DIAGNOSIS — D509 Iron deficiency anemia, unspecified: Secondary | ICD-10-CM | POA: Diagnosis not present

## 2011-07-08 DIAGNOSIS — N186 End stage renal disease: Secondary | ICD-10-CM | POA: Diagnosis not present

## 2011-07-11 ENCOUNTER — Encounter (HOSPITAL_COMMUNITY)
Admission: RE | Disposition: A | Discharge status: Home / Self Care | Payer: Self-pay | Source: Ambulatory Visit | Attending: Vascular Surgery

## 2011-07-11 ENCOUNTER — Other Ambulatory Visit: Payer: Self-pay | Admitting: Thoracic Diseases

## 2011-07-11 ENCOUNTER — Ambulatory Visit (HOSPITAL_COMMUNITY)
Admission: RE | Admit: 2011-07-11 | Discharge: 2011-07-11 | Disposition: A | Discharge status: Home / Self Care | Payer: Medicare Other | Source: Ambulatory Visit | Attending: Vascular Surgery | Admitting: Vascular Surgery

## 2011-07-11 ENCOUNTER — Encounter (HOSPITAL_COMMUNITY): Payer: Self-pay | Admitting: *Deleted

## 2011-07-11 ENCOUNTER — Encounter (HOSPITAL_COMMUNITY): Payer: Self-pay | Admitting: Anesthesiology

## 2011-07-11 ENCOUNTER — Ambulatory Visit (HOSPITAL_COMMUNITY): Payer: Medicare Other

## 2011-07-11 ENCOUNTER — Ambulatory Visit (HOSPITAL_COMMUNITY): Payer: Medicare Other | Admitting: Anesthesiology

## 2011-07-11 DIAGNOSIS — T82898A Other specified complication of vascular prosthetic devices, implants and grafts, initial encounter: Secondary | ICD-10-CM | POA: Insufficient documentation

## 2011-07-11 DIAGNOSIS — I120 Hypertensive chronic kidney disease with stage 5 chronic kidney disease or end stage renal disease: Secondary | ICD-10-CM | POA: Insufficient documentation

## 2011-07-11 DIAGNOSIS — N186 End stage renal disease: Secondary | ICD-10-CM | POA: Diagnosis not present

## 2011-07-11 DIAGNOSIS — I10 Essential (primary) hypertension: Secondary | ICD-10-CM | POA: Diagnosis not present

## 2011-07-11 DIAGNOSIS — Y849 Medical procedure, unspecified as the cause of abnormal reaction of the patient, or of later complication, without mention of misadventure at the time of the procedure: Secondary | ICD-10-CM | POA: Insufficient documentation

## 2011-07-11 DIAGNOSIS — Z992 Dependence on renal dialysis: Secondary | ICD-10-CM | POA: Insufficient documentation

## 2011-07-11 DIAGNOSIS — I509 Heart failure, unspecified: Secondary | ICD-10-CM | POA: Diagnosis not present

## 2011-07-11 HISTORY — PX: THROMBECTOMY W/ EMBOLECTOMY: SHX2507

## 2011-07-11 LAB — POCT I-STAT 4, (NA,K, GLUC, HGB,HCT)
HCT: 42 % (ref 39.0–52.0)
Hemoglobin: 14.3 g/dL (ref 13.0–17.0)
Sodium: 135 mEq/L (ref 135–145)

## 2011-07-11 LAB — SURGICAL PCR SCREEN: Staphylococcus aureus: NEGATIVE

## 2011-07-11 SURGERY — THROMBECTOMY ARTERIOVENOUS GORE-TEX GRAFT
Anesthesia: General | Site: Arm Upper | Laterality: Right | Wound class: Clean

## 2011-07-11 MED ORDER — LIDOCAINE HCL (CARDIAC) 20 MG/ML IV SOLN
INTRAVENOUS | Status: DC | PRN
  Administered 2011-07-11: 60 mg via INTRAVENOUS

## 2011-07-11 MED ORDER — MIDAZOLAM HCL 5 MG/5ML IJ SOLN
INTRAMUSCULAR | Status: DC | PRN
  Administered 2011-07-11: 2 mg via INTRAVENOUS

## 2011-07-11 MED ORDER — CEFAZOLIN SODIUM-DEXTROSE 2-3 GM-% IV SOLR
2.0000 g | INTRAVENOUS | Status: AC
  Administered 2011-07-11: 2 g via INTRAVENOUS
  Filled 2011-07-11: qty 50

## 2011-07-11 MED ORDER — PROPOFOL 10 MG/ML IV EMUL
INTRAVENOUS | Status: DC | PRN
  Administered 2011-07-11: 200 mg via INTRAVENOUS
  Administered 2011-07-11 (×2): 100 mg via INTRAVENOUS

## 2011-07-11 MED ORDER — ROCURONIUM BROMIDE 100 MG/10ML IV SOLN
INTRAVENOUS | Status: DC | PRN
  Administered 2011-07-11: 50 mg via INTRAVENOUS

## 2011-07-11 MED ORDER — NEOSTIGMINE METHYLSULFATE 1 MG/ML IJ SOLN
INTRAMUSCULAR | Status: DC | PRN
  Administered 2011-07-11: 5 mg via INTRAVENOUS

## 2011-07-11 MED ORDER — ONDANSETRON HCL 4 MG/2ML IJ SOLN
INTRAMUSCULAR | Status: DC | PRN
  Administered 2011-07-11: 4 mg via INTRAVENOUS

## 2011-07-11 MED ORDER — FENTANYL CITRATE 0.05 MG/ML IJ SOLN: 25.0000 ug | INTRAMUSCULAR | Status: DC | PRN

## 2011-07-11 MED ORDER — FENTANYL CITRATE 0.05 MG/ML IJ SOLN
INTRAMUSCULAR | Status: DC | PRN
  Administered 2011-07-11: 50 ug via INTRAVENOUS
  Administered 2011-07-11: 100 ug via INTRAVENOUS
  Administered 2011-07-11: 50 ug via INTRAVENOUS

## 2011-07-11 MED ORDER — SODIUM CHLORIDE 0.9 % IV SOLN
INTRAVENOUS | Status: DC
  Administered 2011-07-11: 20 mL/h via INTRAVENOUS

## 2011-07-11 MED ORDER — SODIUM CHLORIDE 0.9 % IV SOLN
INTRAVENOUS | Status: DC | PRN
  Administered 2011-07-11 (×2): via INTRAVENOUS

## 2011-07-11 MED ORDER — MUPIROCIN 2 % EX OINT
TOPICAL_OINTMENT | CUTANEOUS | Status: AC
  Administered 2011-07-11: 1
  Filled 2011-07-11: qty 22

## 2011-07-11 MED ORDER — CEFAZOLIN SODIUM 1-5 GM-% IV SOLN: 1.0000 g | INTRAVENOUS | Status: DC

## 2011-07-11 MED ORDER — MUPIROCIN 2 % EX OINT
TOPICAL_OINTMENT | Freq: Once | CUTANEOUS | Status: DC
  Filled 2011-07-11: qty 22

## 2011-07-11 MED ORDER — OXYCODONE HCL 5 MG PO TABS: 5.0000 mg | ORAL_TABLET | ORAL | Status: AC | PRN

## 2011-07-11 MED ORDER — SODIUM CHLORIDE 0.9 % IR SOLN
Status: DC | PRN
  Administered 2011-07-11: 15:00:00

## 2011-07-11 MED ORDER — 0.9 % SODIUM CHLORIDE (POUR BTL) OPTIME
TOPICAL | Status: DC | PRN
  Administered 2011-07-11: 1000 mL

## 2011-07-11 MED ORDER — GLYCOPYRROLATE 0.2 MG/ML IJ SOLN
INTRAMUSCULAR | Status: DC | PRN
  Administered 2011-07-11: 0.6 mg via INTRAVENOUS

## 2011-07-11 MED ORDER — PHENYLEPHRINE HCL 10 MG/ML IJ SOLN
INTRAMUSCULAR | Status: DC | PRN
  Administered 2011-07-11 (×3): 40 ug via INTRAVENOUS

## 2011-07-11 SURGICAL SUPPLY — 41 items
APL SKNCLS STERI-STRIP NONHPOA (GAUZE/BANDAGES/DRESSINGS) ×2
BENZOIN TINCTURE PRP APPL 2/3 (GAUZE/BANDAGES/DRESSINGS) ×3 IMPLANT
CANISTER SUCTION 2500CC (MISCELLANEOUS) ×3 IMPLANT
CATH EMB 4FR 80CM (CATHETERS) ×3 IMPLANT
CLIP LIGATING EXTRA MED SLVR (CLIP) ×3 IMPLANT
CLIP LIGATING EXTRA SM BLUE (MISCELLANEOUS) ×3 IMPLANT
CLOTH BEACON ORANGE TIMEOUT ST (SAFETY) ×3 IMPLANT
CLSR STERI-STRIP ANTIMIC 1/2X4 (GAUZE/BANDAGES/DRESSINGS) ×3 IMPLANT
COVER SURGICAL LIGHT HANDLE (MISCELLANEOUS) ×6 IMPLANT
DECANTER SPIKE VIAL GLASS SM (MISCELLANEOUS) ×3 IMPLANT
ELECT REM PT RETURN 9FT ADLT (ELECTROSURGICAL) ×3
ELECTRODE REM PT RTRN 9FT ADLT (ELECTROSURGICAL) ×2 IMPLANT
GEL ULTRASOUND 20GR AQUASONIC (MISCELLANEOUS) IMPLANT
GLOVE BIO SURGEON STRL SZ 6.5 (GLOVE) ×3 IMPLANT
GLOVE BIOGEL PI IND STRL 6.5 (GLOVE) ×6 IMPLANT
GLOVE BIOGEL PI IND STRL 7.0 (GLOVE) ×6 IMPLANT
GLOVE BIOGEL PI IND STRL 7.5 (GLOVE) ×4 IMPLANT
GLOVE BIOGEL PI IND STRL 8 (GLOVE) ×2 IMPLANT
GLOVE BIOGEL PI INDICATOR 6.5 (GLOVE) ×3
GLOVE BIOGEL PI INDICATOR 7.0 (GLOVE) ×3
GLOVE BIOGEL PI INDICATOR 7.5 (GLOVE) ×2
GLOVE BIOGEL PI INDICATOR 8 (GLOVE) ×1
GLOVE ECLIPSE 6.5 STRL STRAW (GLOVE) ×6 IMPLANT
GLOVE SS BIOGEL STRL SZ 7.5 (GLOVE) ×2 IMPLANT
GLOVE SUPERSENSE BIOGEL SZ 7.5 (GLOVE) ×1
GOWN STRL NON-REIN LRG LVL3 (GOWN DISPOSABLE) ×9 IMPLANT
KIT BASIN OR (CUSTOM PROCEDURE TRAY) ×3 IMPLANT
KIT ROOM TURNOVER OR (KITS) ×3 IMPLANT
NS IRRIG 1000ML POUR BTL (IV SOLUTION) ×3 IMPLANT
PACK CV ACCESS (CUSTOM PROCEDURE TRAY) ×3 IMPLANT
PAD ARMBOARD 7.5X6 YLW CONV (MISCELLANEOUS) ×6 IMPLANT
SPONGE GAUZE 4X4 12PLY (GAUZE/BANDAGES/DRESSINGS) ×3 IMPLANT
STRIP CLOSURE SKIN 1/2X4 (GAUZE/BANDAGES/DRESSINGS) ×3 IMPLANT
SUT PROLENE 6 0 CC (SUTURE) ×6 IMPLANT
SUT VIC AB 3-0 SH 27 (SUTURE) ×3
SUT VIC AB 3-0 SH 27X BRD (SUTURE) ×2 IMPLANT
TAPE CLOTH SURG 4X10 WHT LF (GAUZE/BANDAGES/DRESSINGS) ×3 IMPLANT
TOWEL OR 17X24 6PK STRL BLUE (TOWEL DISPOSABLE) ×3 IMPLANT
TOWEL OR 17X26 10 PK STRL BLUE (TOWEL DISPOSABLE) ×3 IMPLANT
UNDERPAD 30X30 INCONTINENT (UNDERPADS AND DIAPERS) ×3 IMPLANT
WATER STERILE IRR 1000ML POUR (IV SOLUTION) ×3 IMPLANT

## 2011-07-11 NOTE — Progress Notes (Signed)
[facility] in [place]. Spoke with Shirlean Mylar and made aware that potassium 5.9. Robin spoke with MD. MD to call-in kayexalate for pt, and pt to go to dialysis tomorrow.

## 2011-07-11 NOTE — Transfer of Care (Signed)
Immediate Anesthesia Transfer of Care Note  Patient: [name] [name]  Procedure(s) Performed: Procedure(s) (LRB): THROMBECTOMY ARTERIOVENOUS GORE-TEX GRAFT (Right)  Patient Location: PACU  Anesthesia Type: General  Level of Consciousness: awake, alert  and oriented  Airway & Oxygen Therapy: Patient Spontanous Breathing and Patient connected to nasal cannula oxygen  Post-op Assessment: Report given to PACU RN and Post -op Vital signs reviewed and stable  Post vital signs: Reviewed  Complications: No apparent anesthesia complications

## 2011-07-11 NOTE — H&P (Signed)
  46 yo male with occluded right arm avgg. Had thrombectomy and revision 06/01/11.  Past Medical History  Diagnosis Date  . Hypertension   . Orthostatic hypotension      2008  . Chest discomfort      2008  . Sinus tachycardia      2008, TSH normal  . Ejection fraction      65%, echo, 2008   /   EF 55-60%, echo, October 28, 2010  . Overweight   . Syncope      positional after dialysis... 2008  . Aneurysm      Right arm fistula 3 aneurysms 2011,   plans to have a new procedure  . CHF (congestive heart failure)   . Leg pain   . Angina     occasional, last 6 mo ago  . Dialysis patient     M-W-F @ [facility]  . ESRD (end stage renal disease)     on hemodialysis M-W-F [facility]    History  Substance Use Topics  . Smoking status: Former Smoker    Types: Cigarettes    Quit date: 04/18/1994  . Smokeless tobacco: Current User    Types: Snuff   Comment: dips 1/2 snuff per day times 25 years  . Alcohol Use: No    Family History  Problem Relation Age of Onset  . Hypertension Mother   . Heart disease Mother   . Hypertension Father   . Heart disease Father     No Known Allergies  Current facility-administered medications:ceFAZolin (ANCEF) IVPB 2 g/50 mL premix, 2 g, Intravenous, On Call, Rosetta Posner, MD;  mupirocin ointment (BACTROBAN) 2 %, , Nasal, Once, Rosetta Posner, MD;  mupirocin ointment (BACTROBAN) 2 %, , , , , 1 application at [phone];  DISCONTD: ceFAZolin (ANCEF) IVPB 1 g/50 mL premix, 1 g, Intravenous, On Call, [employer], PA  BP 156/95  Pulse 72  Temp(Src) 97.6 F (36.4 C) (Oral)  Resp 18  Ht 6' (1.829 m)  Wt 335 lb (151.955 kg)  BMI 45.43 kg/m2  SpO2 99%  Body mass index is 45.43 kg/(m^2).      PE occluded right upper arm avgg  A/P: ESRD for thrombectomy/ revision R AVGG

## 2011-07-11 NOTE — Anesthesia Procedure Notes (Signed)
Procedure Name: Intubation Date/Time: 07/11/2011 2:13 PM Performed by: Jenne Campus Pre-anesthesia Checklist: Patient identified, Emergency Drugs available, Suction available and Patient being monitored Patient Re-evaluated:Patient Re-evaluated prior to inductionOxygen Delivery Method: Circle system utilized Preoxygenation: Pre-oxygenation with 100% oxygen Intubation Type: IV induction and Inhalational induction Ventilation: Oral airway inserted - appropriate to patient size and Two handed mask ventilation required Grade View: Grade III Tube type: Oral Tube size: 7.5 mm Number of attempts: 1 Airway Equipment and Method: Video-laryngoscopy and Stylet Placement Confirmation: positive ETCO2 and breath sounds checked- equal and bilateral Secured at: 23 cm Tube secured with: Tape Dental Injury: Teeth and Oropharynx as per pre-operative assessment  Future Recommendations: Recommend- induction with short-acting agent, and alternative techniques readily available Comments: LMA losing tidal volume. TV 100-200. LMA removed after attempted repositioning; 2-hand mask with oral airway. VSS. Dr Ola Spurr notified - converting to oral ETT.

## 2011-07-11 NOTE — Preoperative (Signed)
Beta Blockers   Reason not to administer Beta Blockers:Not Applicable 

## 2011-07-11 NOTE — Discharge Instructions (Signed)
No diagram  07/11/2011 [name] [name] [id number] Mar 06, 1966  Surgeon(s): Rosetta Posner, MD  Procedure(s): THROMBECTOMY AND REVISION OF ARTERIOVENTOUS (AV) GORETEX  GRAFT  Comments: none  x May stick graft immInstructions Following General Anesthetic, Adult A nurse specialized in giving anesthesia (anesthetist) or a doctor specialized in giving anesthesia (anesthesiologist) gave you a medicine that made you sleep while a procedure was performed. For as long as 24 hours following this procedure, you may feel:  Dizzy.   Weak.   Drowsy.  AFTER THE PROCEDURE After surgery, you will be taken to the recovery area where a nurse will monitor your progress. You will be allowed to go home when you are awake, stable, taking fluids well, and without complications. For the first 24 hours following an anesthetic:  Have a responsible person with you.   Do not drive a car. If you are alone, do not take public transportation.   Do not drink alcohol.   Do not take medicine that has not been prescribed by your caregiver.   Do not sign important papers or make important decisions.   You may resume normal diet and activities as directed.   Change bandages (dressings) as directed.   Only take over-the-counter or prescription medicines for pain, discomfort, or fever as directed by your caregiver.  If you have questions or problems that seem related to the anesthetic, call the hospital and ask for the anesthetist or anesthesiologist on call. SEEK IMMEDIATE MEDICAL CARE IF:   You develop a rash.   You have difficulty breathing.   You have chest pain.   You develop any allergic problems.  Document Released: 07/11/2000 Document Revised: 03/24/2011 Document Reviewed: 02/19/2007 Exit[employer] Patient Information 2012 [employer]ediately

## 2011-07-11 NOTE — Anesthesia Postprocedure Evaluation (Signed)
  Anesthesia Post-op Note  Patient: [name] [name]  Procedure(s) Performed: Procedure(s) (LRB): THROMBECTOMY ARTERIOVENOUS GORE-TEX GRAFT (Right)  Patient Location: PACU  Anesthesia Type: General  Level of Consciousness: awake, alert  and oriented  Airway and Oxygen Therapy: Patient Spontanous Breathing  Post-op Pain: mild  Post-op Assessment: Post-op Vital signs reviewed, Patient's Cardiovascular Status Stable, Respiratory Function Stable, Patent Airway and No signs of Nausea or vomiting  Post-op Vital Signs: Reviewed and stable  Complications: No apparent anesthesia complications

## 2011-07-11 NOTE — Op Note (Signed)
OPERATIVE REPORT  DATE OF SURGERY: 07/11/2011  PATIENT: [name] [name], 46 y.o. male MRN: [id number]  DOB: Sep 04, 1965  PRE-OPERATIVE DIAGNOSIS: Disease with occluded right upper arm AV Gore-Tex graft  POST-OPERATIVE DIAGNOSIS:  Same  PROCEDURE: Thrombectomy of right upper extremity AV Gore-Tex graft  SURGEON:  Curt Jews, M.D.  PHYSICIAN ASSISTANT: Roczniak  ANESTHESIA:  Gen.  EBL: Minimal ml  Total I/O In: 500 [I.V.:500] Out: -   BLOOD ADMINISTERED: None  DRAINS: None  SPECIMEN: None  COUNTS CORRECT:  YES  PLAN OF CARE: PACU   PATIENT DISPOSITION:  PACU - hemodynamically stable  PROCEDURE DETAILS: The patient was taken to the operating room placed supine position where the area of the right arm right axilla were prepped and draped in usual sterile fashion. An incision was made over the axilla and carried down to isolate the graft to axillary vein anastomosis. This is been revised roughly 1 month ago. The old anastomosis was reopened and the axillary vein was thrombectomized with good venous backbleeding. 5 dilator passed without resistance through the venous anastomosis. The graft itself was thrombectomized. A large arterialized plug was removed and excellent inflow was encountered. This was flushed with heparinized saline and reoccluded. The graft was reanastomosed to itself with a running 6-0 Prolene suture. Clamps removed and good thrill was noted. 2 anterior GERD with saline. Hemostasis obtained with cautery. Wounds were closed with 3-0 Vicryl in the subcutaneous and subcuticular tissue. Benzoin Steri-Strips were applied   Curt Jews, M.D. 07/11/2011 3:04 PM

## 2011-07-11 NOTE — Anesthesia Preprocedure Evaluation (Signed)
Anesthesia Evaluation  Patient identified by MRN, date of birth, ID band Patient awake    Reviewed: Allergy & Precautions, H&P , NPO status , Patient's Chart, lab work & pertinent test results  Airway Mallampati: II TM Distance: >3 FB Neck ROM: Full    Dental No notable dental hx. (+) Teeth Intact   Pulmonary neg pulmonary ROS,  breath sounds clear to auscultation  Pulmonary exam normal       Cardiovascular hypertension, On Medications +CHF Rhythm:Regular Rate:Normal     Neuro/Psych negative neurological ROS  negative psych ROS   GI/Hepatic negative GI ROS, Neg liver ROS,   Endo/Other  negative endocrine ROSMorbid obesity  Renal/GU CRF and DialysisRenal disease  negative genitourinary   Musculoskeletal   Abdominal (+) + obese,   Peds  Hematology negative hematology ROS (+)   Anesthesia Other Findings   Reproductive/Obstetrics negative OB ROS                           Anesthesia Physical Anesthesia Plan  ASA: III  Anesthesia Plan: General   Post-op Pain Management:    Induction: Intravenous  Airway Management Planned: LMA  Additional Equipment:   Intra-op Plan:   Post-operative Plan: Extubation in OR  Informed Consent: I have reviewed the patients History and Physical, chart, labs and discussed the procedure including the risks, benefits and alternatives for the proposed anesthesia with the patient or authorized representative who has indicated his/her understanding and acceptance.     Plan Discussed with: CRNA  Anesthesia Plan Comments:         Anesthesia Quick Evaluation

## 2011-07-12 ENCOUNTER — Encounter (HOSPITAL_COMMUNITY): Payer: Self-pay | Admitting: Vascular Surgery

## 2011-07-12 DIAGNOSIS — N186 End stage renal disease: Secondary | ICD-10-CM | POA: Diagnosis not present

## 2011-07-12 DIAGNOSIS — N2581 Secondary hyperparathyroidism of renal origin: Secondary | ICD-10-CM | POA: Diagnosis not present

## 2011-07-13 DIAGNOSIS — N2581 Secondary hyperparathyroidism of renal origin: Secondary | ICD-10-CM | POA: Diagnosis not present

## 2011-07-13 DIAGNOSIS — D509 Iron deficiency anemia, unspecified: Secondary | ICD-10-CM | POA: Diagnosis not present

## 2011-07-13 DIAGNOSIS — N186 End stage renal disease: Secondary | ICD-10-CM | POA: Diagnosis not present

## 2011-07-14 ENCOUNTER — Other Ambulatory Visit: Payer: Self-pay

## 2011-07-14 DIAGNOSIS — N186 End stage renal disease: Secondary | ICD-10-CM

## 2011-07-14 DIAGNOSIS — Z01810 Encounter for preprocedural cardiovascular examination: Secondary | ICD-10-CM

## 2011-07-15 DIAGNOSIS — N186 End stage renal disease: Secondary | ICD-10-CM | POA: Diagnosis not present

## 2011-07-15 DIAGNOSIS — N2581 Secondary hyperparathyroidism of renal origin: Secondary | ICD-10-CM | POA: Diagnosis not present

## 2011-07-15 DIAGNOSIS — D509 Iron deficiency anemia, unspecified: Secondary | ICD-10-CM | POA: Diagnosis not present

## 2011-07-17 DIAGNOSIS — N186 End stage renal disease: Secondary | ICD-10-CM | POA: Diagnosis not present

## 2011-07-18 DIAGNOSIS — N2581 Secondary hyperparathyroidism of renal origin: Secondary | ICD-10-CM | POA: Diagnosis not present

## 2011-07-18 DIAGNOSIS — D509 Iron deficiency anemia, unspecified: Secondary | ICD-10-CM | POA: Diagnosis not present

## 2011-07-18 DIAGNOSIS — N186 End stage renal disease: Secondary | ICD-10-CM | POA: Diagnosis not present

## 2011-07-20 DIAGNOSIS — D509 Iron deficiency anemia, unspecified: Secondary | ICD-10-CM | POA: Diagnosis not present

## 2011-07-20 DIAGNOSIS — N186 End stage renal disease: Secondary | ICD-10-CM | POA: Diagnosis not present

## 2011-07-20 DIAGNOSIS — N2581 Secondary hyperparathyroidism of renal origin: Secondary | ICD-10-CM | POA: Diagnosis not present

## 2011-07-22 DIAGNOSIS — D509 Iron deficiency anemia, unspecified: Secondary | ICD-10-CM | POA: Diagnosis not present

## 2011-07-22 DIAGNOSIS — N2581 Secondary hyperparathyroidism of renal origin: Secondary | ICD-10-CM | POA: Diagnosis not present

## 2011-07-22 DIAGNOSIS — N186 End stage renal disease: Secondary | ICD-10-CM | POA: Diagnosis not present

## 2011-07-25 DIAGNOSIS — N186 End stage renal disease: Secondary | ICD-10-CM | POA: Diagnosis not present

## 2011-07-25 DIAGNOSIS — N2581 Secondary hyperparathyroidism of renal origin: Secondary | ICD-10-CM | POA: Diagnosis not present

## 2011-07-25 DIAGNOSIS — D509 Iron deficiency anemia, unspecified: Secondary | ICD-10-CM | POA: Diagnosis not present

## 2011-07-27 DIAGNOSIS — N186 End stage renal disease: Secondary | ICD-10-CM | POA: Diagnosis not present

## 2011-07-27 DIAGNOSIS — N2581 Secondary hyperparathyroidism of renal origin: Secondary | ICD-10-CM | POA: Diagnosis not present

## 2011-07-27 DIAGNOSIS — D509 Iron deficiency anemia, unspecified: Secondary | ICD-10-CM | POA: Diagnosis not present

## 2011-08-01 ENCOUNTER — Ambulatory Visit: Payer: Medicare Other | Admitting: Cardiology

## 2011-08-01 DIAGNOSIS — D509 Iron deficiency anemia, unspecified: Secondary | ICD-10-CM | POA: Diagnosis not present

## 2011-08-01 DIAGNOSIS — N2581 Secondary hyperparathyroidism of renal origin: Secondary | ICD-10-CM | POA: Diagnosis not present

## 2011-08-01 DIAGNOSIS — N186 End stage renal disease: Secondary | ICD-10-CM | POA: Diagnosis not present

## 2011-08-03 DIAGNOSIS — D509 Iron deficiency anemia, unspecified: Secondary | ICD-10-CM | POA: Diagnosis not present

## 2011-08-03 DIAGNOSIS — N2581 Secondary hyperparathyroidism of renal origin: Secondary | ICD-10-CM | POA: Diagnosis not present

## 2011-08-03 DIAGNOSIS — N186 End stage renal disease: Secondary | ICD-10-CM | POA: Diagnosis not present

## 2011-08-05 DIAGNOSIS — D509 Iron deficiency anemia, unspecified: Secondary | ICD-10-CM | POA: Diagnosis not present

## 2011-08-05 DIAGNOSIS — N2581 Secondary hyperparathyroidism of renal origin: Secondary | ICD-10-CM | POA: Diagnosis not present

## 2011-08-05 DIAGNOSIS — N186 End stage renal disease: Secondary | ICD-10-CM | POA: Diagnosis not present

## 2011-08-08 DIAGNOSIS — N186 End stage renal disease: Secondary | ICD-10-CM | POA: Diagnosis not present

## 2011-08-08 DIAGNOSIS — N2581 Secondary hyperparathyroidism of renal origin: Secondary | ICD-10-CM | POA: Diagnosis not present

## 2011-08-08 DIAGNOSIS — D509 Iron deficiency anemia, unspecified: Secondary | ICD-10-CM | POA: Diagnosis not present

## 2011-08-10 DIAGNOSIS — N186 End stage renal disease: Secondary | ICD-10-CM | POA: Diagnosis not present

## 2011-08-10 DIAGNOSIS — N2581 Secondary hyperparathyroidism of renal origin: Secondary | ICD-10-CM | POA: Diagnosis not present

## 2011-08-10 DIAGNOSIS — D509 Iron deficiency anemia, unspecified: Secondary | ICD-10-CM | POA: Diagnosis not present

## 2011-08-12 DIAGNOSIS — N2581 Secondary hyperparathyroidism of renal origin: Secondary | ICD-10-CM | POA: Diagnosis not present

## 2011-08-12 DIAGNOSIS — D509 Iron deficiency anemia, unspecified: Secondary | ICD-10-CM | POA: Diagnosis not present

## 2011-08-12 DIAGNOSIS — N186 End stage renal disease: Secondary | ICD-10-CM | POA: Diagnosis not present

## 2011-08-15 DIAGNOSIS — D509 Iron deficiency anemia, unspecified: Secondary | ICD-10-CM | POA: Diagnosis not present

## 2011-08-15 DIAGNOSIS — N2581 Secondary hyperparathyroidism of renal origin: Secondary | ICD-10-CM | POA: Diagnosis not present

## 2011-08-15 DIAGNOSIS — N186 End stage renal disease: Secondary | ICD-10-CM | POA: Diagnosis not present

## 2011-08-16 DIAGNOSIS — N186 End stage renal disease: Secondary | ICD-10-CM | POA: Diagnosis not present

## 2011-08-17 ENCOUNTER — Encounter: Payer: Self-pay | Admitting: Vascular Surgery

## 2011-08-17 DIAGNOSIS — N186 End stage renal disease: Secondary | ICD-10-CM | POA: Diagnosis not present

## 2011-08-17 DIAGNOSIS — N2581 Secondary hyperparathyroidism of renal origin: Secondary | ICD-10-CM | POA: Diagnosis not present

## 2011-08-17 DIAGNOSIS — D509 Iron deficiency anemia, unspecified: Secondary | ICD-10-CM | POA: Diagnosis not present

## 2011-08-18 ENCOUNTER — Ambulatory Visit (INDEPENDENT_AMBULATORY_CARE_PROVIDER_SITE_OTHER): Payer: Medicare Other | Admitting: Vascular Surgery

## 2011-08-18 ENCOUNTER — Encounter: Payer: Self-pay | Admitting: *Deleted

## 2011-08-18 ENCOUNTER — Other Ambulatory Visit: Payer: Self-pay | Admitting: *Deleted

## 2011-08-18 ENCOUNTER — Encounter: Payer: Self-pay | Admitting: Vascular Surgery

## 2011-08-18 ENCOUNTER — Ambulatory Visit (INDEPENDENT_AMBULATORY_CARE_PROVIDER_SITE_OTHER): Payer: BC Managed Care – PPO

## 2011-08-18 VITALS — BP 107/77 | HR 90 | Resp 16 | Ht 72.0 in | Wt 339.0 lb

## 2011-08-18 DIAGNOSIS — T82898A Other specified complication of vascular prosthetic devices, implants and grafts, initial encounter: Secondary | ICD-10-CM

## 2011-08-18 DIAGNOSIS — N186 End stage renal disease: Secondary | ICD-10-CM | POA: Diagnosis not present

## 2011-08-18 DIAGNOSIS — Z01810 Encounter for preprocedural cardiovascular examination: Secondary | ICD-10-CM

## 2011-08-18 NOTE — Progress Notes (Signed)
[facility] [place] HISTORY AND PHYSICAL   History of Present Illness:  Patient is a 46 y.o. year old male who presents for placement of a permanent hemodialysis access. He has had multiple prior access procedures. He currently is using a right upper arm graft however this is slowly failing. He has had multiple grafts in both upper extremities. His last left upper extremity graft was removed after it ruptured. He currently dialyzes on Monday Wednesday and Friday.  Past Medical History  Diagnosis Date  . Hypertension   . Orthostatic hypotension      2008  . Chest discomfort      2008  . Sinus tachycardia      2008, TSH normal  . Ejection fraction      65%, echo, 2008   /   EF 55-60%, echo, October 28, 2010  . Overweight   . Syncope      positional after dialysis... 2008  . Aneurysm      Right arm fistula 3 aneurysms 2011,   plans to have a new procedure  . CHF (congestive heart failure)   . Leg pain   . Angina     occasional, last 6 mo ago  . Dialysis patient     M-W-F @ [facility]  . ESRD (end stage renal disease)     on hemodialysis M-W-F [facility]    Past Surgical History  Procedure Date  . Right arm graft     for dyalisis  . Umbilical hernia repair   . Thrombectomy   . Arteriovenous graft placement   . Av fistula placement   . Thrombectomy w/ embolectomy 03/01/2011    Procedure: THROMBECTOMY ARTERIOVENOUS GORE-TEX GRAFT;  Surgeon: Elam Dutch, MD;  Location: [facility];  Service: Vascular;  Laterality: Right;  Attempted Thrombectomy of Old  Right Upper Arm Arteriovenous gortex Graft. Insertion of new Arteriovenous Graft using 7mm x 50cm Gortex Stretch graft.   . Insertion of dialysis catheter 03/01/2011    Procedure: INSERTION OF DIALYSIS CATHETER;  Surgeon: Elam Dutch, MD;  Location: [facility];  Service: Vascular;  Laterality: Left;  Exchange of Dialysis Catheter to 27cm 15Fr. Arrow Catheter  . Thrombectomy w/ embolectomy 07/11/2011    Procedure:  THROMBECTOMY ARTERIOVENOUS GORE-TEX GRAFT;  Surgeon: Rosetta Posner, MD;  Location: [facility] OR;  Service: Vascular;  Laterality: Right;     Social History History  Substance Use Topics  . Smoking status: Former Smoker    Types: Cigarettes    Quit date: 04/18/1994  . Smokeless tobacco: Current User    Types: Snuff   Comment: dips 1/2 snuff per day times 25 years  . Alcohol Use: No    Family History Family History  Problem Relation Age of Onset  . Hypertension Mother   . Heart disease Mother   . Hypertension Father   . Heart disease Father     Allergies  No Known Allergies   Current Outpatient Prescriptions  Medication Sig Dispense Refill  . aspirin 81 MG tablet Take 81 mg by mouth daily.        . B Complex-C-Folic Acid (NEPHRO-VITE PO) Take 1 tablet by mouth daily.        . calcium acetate (PHOSLO) 667 MG capsule Take 1,334 mg by mouth 3 (three) times daily with meals. And 1 with snacks       . diltiazem (CARDIZEM) 90 MG tablet Take 90 mg by mouth daily.        . nitroGLYCERIN (  NITROSTAT) 0.4 MG SL tablet Place 0.4 mg under the tongue every 5 (five) minutes as needed. For chest       . tiZANidine (ZANAFLEX) 2 MG tablet Take 2 mg by mouth every 8 (eight) hours.          ROS:   General:  No weight loss, Fever, chills  HEENT: No recent headaches, no nasal bleeding, no visual changes, no sore throat  Cardiac: No recent episodes of chest pain/pressure, no shortness of breath at rest.  No shortness of breath with exertion.  Denies history of atrial fibrillation or irregular heartbeat  Pulmonary: No home oxygen, no productive cough, no hemoptysis,  No asthma or wheezing  Urinary: [ ]  chronic Kidney disease, [ x] on HD - x] MWF or [ ]  TTHS, [ ]  Burning with urination, [ ]  Frequent urination, [ ]  Difficulty urinating;   Skin: No rashes   Physical Examination  Filed Vitals:   08/18/11 1229  BP: 107/77  Pulse: 90  Resp: 16  Height: 6' (1.829 m)  Weight: 339 lb (153.769  kg)  SpO2: 100%    Body mass index is 45.98 kg/(m^2).  General:  Alert and oriented, no acute distress HEENT: Normal Pulmonary: Clear to auscultation bilaterally Cardiac: Regular Rate and Rhythm without murmur Gastrointestinal: Soft, non-tender, non-distended, no mass, Obese Skin: No rash Extremity Pulses:  absent radial pulses bilaterally, right upper arm graft with audible bruit, pulsatile Musculoskeletal: No deformity or edema  Neurologic: Upper and lower extremity motor 5/5 and symmetric  DATA: The patient had a vein mapping ultrasound today which I reviewed and interpreted. This is fairly complicated due to multiple prior access procedures. Patient did have a reasonable size basilic vein on the right side but this is currently the outflow for his AV graft. He has no suitable veins in the left arm for fistula.   ASSESSMENT: Difficult access situation. I believe the best option for the patient at this point would be to repeat a left central venogram to see if he has an axillary vein it would be suitable for a hybrid graft or possibly a new left upper arm AV graft. He may also be a potential candidate for a left-sided HERO graft.   PLAN:  Left-sided central venogram on my partner Dr. Bridgett Larsson on 08/23/2011  Ruta Hinds, MD [facility] [place] Office: [phone] Pager: [phone]

## 2011-08-19 DIAGNOSIS — N2581 Secondary hyperparathyroidism of renal origin: Secondary | ICD-10-CM | POA: Diagnosis not present

## 2011-08-19 DIAGNOSIS — D509 Iron deficiency anemia, unspecified: Secondary | ICD-10-CM | POA: Diagnosis not present

## 2011-08-19 DIAGNOSIS — N186 End stage renal disease: Secondary | ICD-10-CM | POA: Diagnosis not present

## 2011-08-22 ENCOUNTER — Encounter (HOSPITAL_COMMUNITY): Payer: Self-pay | Admitting: Respiratory Therapy

## 2011-08-22 DIAGNOSIS — D509 Iron deficiency anemia, unspecified: Secondary | ICD-10-CM | POA: Diagnosis not present

## 2011-08-22 DIAGNOSIS — N186 End stage renal disease: Secondary | ICD-10-CM | POA: Diagnosis not present

## 2011-08-22 DIAGNOSIS — N2581 Secondary hyperparathyroidism of renal origin: Secondary | ICD-10-CM | POA: Diagnosis not present

## 2011-08-22 MED ORDER — SODIUM CHLORIDE 0.9 % IJ SOLN: 3.0000 mL | INTRAMUSCULAR | Status: DC | PRN

## 2011-08-23 ENCOUNTER — Ambulatory Visit (HOSPITAL_COMMUNITY)
Admission: RE | Admit: 2011-08-23 | Discharge: 2011-08-23 | Disposition: A | Discharge status: Home / Self Care | Payer: Medicare Other | Source: Ambulatory Visit | Attending: Surgery | Admitting: Surgery

## 2011-08-23 ENCOUNTER — Encounter (HOSPITAL_COMMUNITY)
Admission: RE | Disposition: A | Discharge status: Home / Self Care | Payer: Self-pay | Source: Ambulatory Visit | Attending: Surgery

## 2011-08-23 DIAGNOSIS — I120 Hypertensive chronic kidney disease with stage 5 chronic kidney disease or end stage renal disease: Secondary | ICD-10-CM | POA: Diagnosis not present

## 2011-08-23 DIAGNOSIS — Z992 Dependence on renal dialysis: Secondary | ICD-10-CM | POA: Insufficient documentation

## 2011-08-23 DIAGNOSIS — N186 End stage renal disease: Secondary | ICD-10-CM | POA: Diagnosis not present

## 2011-08-23 DIAGNOSIS — T82898A Other specified complication of vascular prosthetic devices, implants and grafts, initial encounter: Secondary | ICD-10-CM

## 2011-08-23 HISTORY — PX: VENOGRAM: SHX5497

## 2011-08-23 SURGERY — VENOGRAM
Anesthesia: LOCAL

## 2011-08-23 MED ORDER — CLONIDINE HCL 0.2 MG PO TABS: 0.2000 mg | ORAL_TABLET | ORAL | Status: DC | PRN

## 2011-08-23 MED ORDER — METOPROLOL TARTRATE 1 MG/ML IV SOLN: 2.0000 mg | INTRAVENOUS | Status: DC | PRN

## 2011-08-23 MED ORDER — PHENOL 1.4 % MT LIQD: 1.0000 | OROMUCOSAL | Status: DC | PRN

## 2011-08-23 MED ORDER — ONDANSETRON HCL 4 MG/2ML IJ SOLN: 4.0000 mg | Freq: Four times a day (QID) | INTRAMUSCULAR | Status: DC | PRN

## 2011-08-23 NOTE — Interval H&P Note (Signed)
History and Physical Interval Note:  08/23/2011 1:13 PM  [name] [name]  has presented today for surgery, with the diagnosis of instage renal  The various methods of treatment have been discussed with the patient and family. After consideration of risks, benefits and other options for treatment, the patient has consented to  Procedure(s) (LRB): VENOGRAM (N/A) as a surgical intervention .  The patients' history has been reviewed, patient examined, no change in status, stable for surgery.  I have reviewed the patients' chart and labs.  Questions were answered to the patient's satisfaction.     Parthenia Tellefsen IV, V. WELLS

## 2011-08-23 NOTE — H&P (View-Only) (Signed)
[facility] [place] HISTORY AND PHYSICAL   History of Present Illness:  Patient is a 46 y.o. year old male who presents for placement of a permanent hemodialysis access. He has had multiple prior access procedures. He currently is using a right upper arm graft however this is slowly failing. He has had multiple grafts in both upper extremities. His last left upper extremity graft was removed after it ruptured. He currently dialyzes on Monday Wednesday and Friday.  Past Medical History  Diagnosis Date  . Hypertension   . Orthostatic hypotension      2008  . Chest discomfort      2008  . Sinus tachycardia      2008, TSH normal  . Ejection fraction      65%, echo, 2008   /   EF 55-60%, echo, October 28, 2010  . Overweight   . Syncope      positional after dialysis... 2008  . Aneurysm      Right arm fistula 3 aneurysms 2011,   plans to have a new procedure  . CHF (congestive heart failure)   . Leg pain   . Angina     occasional, last 6 mo ago  . Dialysis patient     M-W-F @ [facility]  . ESRD (end stage renal disease)     on hemodialysis M-W-F [facility]    Past Surgical History  Procedure Date  . Right arm graft     for dyalisis  . Umbilical hernia repair   . Thrombectomy   . Arteriovenous graft placement   . Av fistula placement   . Thrombectomy w/ embolectomy 03/01/2011    Procedure: THROMBECTOMY ARTERIOVENOUS GORE-TEX GRAFT;  Surgeon: Elam Dutch, MD;  Location: [place];  Service: Vascular;  Laterality: Right;  Attempted Thrombectomy of Old  Right Upper Arm Arteriovenous gortex Graft. Insertion of new Arteriovenous Graft using 49mm x 50cm Gortex Stretch graft.   . Insertion of dialysis catheter 03/01/2011    Procedure: INSERTION OF DIALYSIS CATHETER;  Surgeon: Elam Dutch, MD;  Location: [facility];  Service: Vascular;  Laterality: Left;  Exchange of Dialysis Catheter to 27cm 15Fr. Arrow Catheter  . Thrombectomy w/ embolectomy 07/11/2011    Procedure:  THROMBECTOMY ARTERIOVENOUS GORE-TEX GRAFT;  Surgeon: Rosetta Posner, MD;  Location: [facility] OR;  Service: Vascular;  Laterality: Right;     Social History History  Substance Use Topics  . Smoking status: Former Smoker    Types: Cigarettes    Quit date: 04/18/1994  . Smokeless tobacco: Current User    Types: Snuff   Comment: dips 1/2 snuff per day times 25 years  . Alcohol Use: No    Family History Family History  Problem Relation Age of Onset  . Hypertension Mother   . Heart disease Mother   . Hypertension Father   . Heart disease Father     Allergies  No Known Allergies   Current Outpatient Prescriptions  Medication Sig Dispense Refill  . aspirin 81 MG tablet Take 81 mg by mouth daily.        . B Complex-C-Folic Acid (NEPHRO-VITE PO) Take 1 tablet by mouth daily.        . calcium acetate (PHOSLO) 667 MG capsule Take 1,334 mg by mouth 3 (three) times daily with meals. And 1 with snacks       . diltiazem (CARDIZEM) 90 MG tablet Take 90 mg by mouth daily.        . nitroGLYCERIN (  NITROSTAT) 0.4 MG SL tablet Place 0.4 mg under the tongue every 5 (five) minutes as needed. For chest       . tiZANidine (ZANAFLEX) 2 MG tablet Take 2 mg by mouth every 8 (eight) hours.          ROS:   General:  No weight loss, Fever, chills  HEENT: No recent headaches, no nasal bleeding, no visual changes, no sore throat  Cardiac: No recent episodes of chest pain/pressure, no shortness of breath at rest.  No shortness of breath with exertion.  Denies history of atrial fibrillation or irregular heartbeat  Pulmonary: No home oxygen, no productive cough, no hemoptysis,  No asthma or wheezing  Urinary: [ ]  chronic Kidney disease, [ x] on HD - x] MWF or [ ]  TTHS, [ ]  Burning with urination, [ ]  Frequent urination, [ ]  Difficulty urinating;   Skin: No rashes   Physical Examination  Filed Vitals:   08/18/11 1229  BP: 107/77  Pulse: 90  Resp: 16  Height: 6' (1.829 m)  Weight: 339 lb (153.769  kg)  SpO2: 100%    Body mass index is 45.98 kg/(m^2).  General:  Alert and oriented, no acute distress HEENT: Normal Pulmonary: Clear to auscultation bilaterally Cardiac: Regular Rate and Rhythm without murmur Gastrointestinal: Soft, non-tender, non-distended, no mass, Obese Skin: No rash Extremity Pulses:  absent radial pulses bilaterally, right upper arm graft with audible bruit, pulsatile Musculoskeletal: No deformity or edema  Neurologic: Upper and lower extremity motor 5/5 and symmetric  DATA: The patient had a vein mapping ultrasound today which I reviewed and interpreted. This is fairly complicated due to multiple prior access procedures. Patient did have a reasonable size basilic vein on the right side but this is currently the outflow for his AV graft. He has no suitable veins in the left arm for fistula.   ASSESSMENT: Difficult access situation. I believe the best option for the patient at this point would be to repeat a left central venogram to see if he has an axillary vein it would be suitable for a hybrid graft or possibly a new left upper arm AV graft. He may also be a potential candidate for a left-sided HERO graft.   PLAN:  Left-sided central venogram on my partner Dr. Bridgett Larsson on 08/23/2011  Ruta Hinds, MD [facility] [place] Office: [phone] Pager: [phone]

## 2011-08-23 NOTE — Op Note (Signed)
[facility] [place]  Patient name: [name] [name] MRN: [id number] DOB: 1965-10-02 Sex: male  08/23/2011 Pre-operative Diagnosis: End-stage renal disease Post-operative diagnosis:  Same Surgeon:  Eldridge Abrahams Procedure Performed:  1.  left arm and central venogram   Indications:  The patient is in need of new access. He has had multiple upper extremity procedures he comes in today to further define his anatomy. An IV was placed in the holding area.  Procedure: Contrast was injected through the IV placed in the holding area. Images of the proximal left upper arm and central venous system were obtained.  Findings:  A very diminutive brachial vein is identified. The subclavian vein is patent however the left innominate vein is occluded. There is some filling of the left internal jugular vein.    Impression:  #1  occluded central venous system on the left   V. Annamarie Major, M.D. [facility] [place] Office: [phone] Pager:  [phone]

## 2011-08-24 ENCOUNTER — Telehealth: Payer: Self-pay | Admitting: Vascular Surgery

## 2011-08-24 DIAGNOSIS — N2581 Secondary hyperparathyroidism of renal origin: Secondary | ICD-10-CM | POA: Diagnosis not present

## 2011-08-24 DIAGNOSIS — N186 End stage renal disease: Secondary | ICD-10-CM | POA: Diagnosis not present

## 2011-08-24 DIAGNOSIS — D509 Iron deficiency anemia, unspecified: Secondary | ICD-10-CM | POA: Diagnosis not present

## 2011-08-24 LAB — POCT I-STAT, CHEM 8
BUN: 44 mg/dL — ABNORMAL HIGH (ref 6–23)
Calcium, Ion: 1.1 mmol/L — ABNORMAL LOW (ref 1.12–1.32)
Chloride: 107 mEq/L (ref 96–112)
Glucose, Bld: 86 mg/dL (ref 70–99)
HCT: 47 % (ref 39.0–52.0)
Potassium: 4.4 mEq/L (ref 3.5–5.1)

## 2011-08-24 NOTE — Telephone Encounter (Addendum)
Message copied by Doristine Section on Wed Aug 24, 2011 12:04 PM ------      Message from: Alfonso Patten      Created: Tue Aug 23, 2011  4:51 PM                   ----- Message -----         From: [name], MD         Sent: 08/23/2011   1:30 PM           To: Doyle Askew, Alfonso Patten, RN            08/23/2011, the patient had the following procedures:             1.  left arm and central venogram            Please schedule the patient to come back to see Dr. fields for dialysis access planning  08-24-11 L/V/M TO INFORM PT. OF FU APPT. 09-15-11 1:15PM

## 2011-08-26 DIAGNOSIS — D509 Iron deficiency anemia, unspecified: Secondary | ICD-10-CM | POA: Diagnosis not present

## 2011-08-26 DIAGNOSIS — N186 End stage renal disease: Secondary | ICD-10-CM | POA: Diagnosis not present

## 2011-08-26 DIAGNOSIS — N2581 Secondary hyperparathyroidism of renal origin: Secondary | ICD-10-CM | POA: Diagnosis not present

## 2011-08-26 NOTE — Procedures (Unsigned)
VASCULAR LAB EXAM  INDICATION:  Excessive bleeding after dialysis using right upper arm dialysis access.  The patient is status post placement and creation of multiple access sites in both upper extremities.  HISTORY: Diabetes: Cardiac: Hypertension:  EXAM:  There is a patent right upper arm access.  The brachial artery is enlarged at the anastomosis in the antecubital fossa measuring 1.38 cm in diameter.  The velocities throughout the AVG are within normal limits.  There is soft thrombus within the graft near the venous anastomosis.  A pseudoaneurysm measuring approximately 1.6 cm in length is noted along the posterior wall of the graft at the proximal upper arm.  The visualized segments of the axillary vein are patent.  There is a hypoechoic halo surrounding the synthetic  segments of the graft.  The right basilic vein in the distal upper arm is thick-walled and multiple branches are noted in the proximal upper arm.  The right cephalic vein is occluded with a calcified thrombus throughout the upper arm (status post previous ligated brachiocephalic AVF).  The basilic vein is small and sclerotic, measuring less than or equal to 0.26 cm. The left cephalic vein is not visualized from the mid upper arm to the forearm.  IMPRESSION: 1. Patent right upper arm arteriovenous graft with soft thrombus near     the venous anastomosis. 2. Pseudoaneurysm along posterior wall of the arteriovenous graft in     the proximal right upper arm. 3. Inhomogeneous halo surrounding the right upper arm arteriovenous     graft. 4. Occluded right cephalic vein and nonvisualized left cephalic vein. 5. Thick walls of the basilic vein bilaterally.  ADDITIONAL TECHNOLOGIST:  Alvia Grove, RVT  ___________________________________________ Jessy Oto. Fields, MD  CI/MEDQ  D:  08/18/2011  T:  08/18/2011  Job:  [id number]

## 2011-08-29 DIAGNOSIS — N186 End stage renal disease: Secondary | ICD-10-CM | POA: Diagnosis not present

## 2011-08-29 DIAGNOSIS — D509 Iron deficiency anemia, unspecified: Secondary | ICD-10-CM | POA: Diagnosis not present

## 2011-08-29 DIAGNOSIS — N2581 Secondary hyperparathyroidism of renal origin: Secondary | ICD-10-CM | POA: Diagnosis not present

## 2011-08-31 DIAGNOSIS — D509 Iron deficiency anemia, unspecified: Secondary | ICD-10-CM | POA: Diagnosis not present

## 2011-08-31 DIAGNOSIS — N186 End stage renal disease: Secondary | ICD-10-CM | POA: Diagnosis not present

## 2011-08-31 DIAGNOSIS — N2581 Secondary hyperparathyroidism of renal origin: Secondary | ICD-10-CM | POA: Diagnosis not present

## 2011-09-02 DIAGNOSIS — D509 Iron deficiency anemia, unspecified: Secondary | ICD-10-CM | POA: Diagnosis not present

## 2011-09-02 DIAGNOSIS — N2581 Secondary hyperparathyroidism of renal origin: Secondary | ICD-10-CM | POA: Diagnosis not present

## 2011-09-02 DIAGNOSIS — N186 End stage renal disease: Secondary | ICD-10-CM | POA: Diagnosis not present

## 2011-09-05 DIAGNOSIS — N186 End stage renal disease: Secondary | ICD-10-CM | POA: Diagnosis not present

## 2011-09-05 DIAGNOSIS — N2581 Secondary hyperparathyroidism of renal origin: Secondary | ICD-10-CM | POA: Diagnosis not present

## 2011-09-05 DIAGNOSIS — D509 Iron deficiency anemia, unspecified: Secondary | ICD-10-CM | POA: Diagnosis not present

## 2011-09-06 ENCOUNTER — Ambulatory Visit (INDEPENDENT_AMBULATORY_CARE_PROVIDER_SITE_OTHER): Payer: BLUE CROSS/BLUE SHIELD | Admitting: Cardiology

## 2011-09-06 ENCOUNTER — Encounter: Payer: Self-pay | Admitting: Cardiology

## 2011-09-06 VITALS — BP 105/75 | HR 86 | Ht 72.0 in | Wt 346.0 lb

## 2011-09-06 DIAGNOSIS — N186 End stage renal disease: Secondary | ICD-10-CM

## 2011-09-06 DIAGNOSIS — I498 Other specified cardiac arrhythmias: Secondary | ICD-10-CM | POA: Diagnosis not present

## 2011-09-06 DIAGNOSIS — R0789 Other chest pain: Secondary | ICD-10-CM

## 2011-09-06 DIAGNOSIS — R000 Tachycardia, unspecified: Secondary | ICD-10-CM

## 2011-09-06 DIAGNOSIS — I951 Orthostatic hypotension: Secondary | ICD-10-CM

## 2011-09-06 DIAGNOSIS — R55 Syncope and collapse: Secondary | ICD-10-CM

## 2011-09-06 NOTE — Assessment & Plan Note (Signed)
The patient continues on dialysis.

## 2011-09-06 NOTE — Assessment & Plan Note (Addendum)
He's not having any significant chest pain. The patient's EKG today shows no significant change from 2012. The tracing from earlier in May, 2013, has decreased R wave in V2 that is probably positional. There is no significant change. No further workup is needed.

## 2011-09-06 NOTE — Patient Instructions (Signed)
Your physician recommends that you schedule a follow-up appointment in: 1 year with Dr. Ron Parker. You will receive a reminder letter in the mail in about 10 months reminding you to call and schedule your appointment. If you don't receive this letter, please contact our office to schedule.  Your physician recommends that you return for lab work in: [place]  Your physician recommends that you continue on your current medications as directed. Please refer to the Current Medication list given to you today.

## 2011-09-06 NOTE — Assessment & Plan Note (Signed)
There has been no evidence of any recurrent syncope or presyncope.

## 2011-09-06 NOTE — Progress Notes (Signed)
HPI Patient is seen today for cardiology followup. I saw him last July, 2012. There is no history of documented coronary disease. He has a long-standing history of mild persistent sinus tachycardia. When I saw him in July, 2012 decision was made to proceed with 2-D echo to reassess his LV function. He had an echo in July, 2012. Ejection fraction was 60%. There were no focal wall motion abnormalities. There was trace mitral regurgitation. 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 He may need further vascular surgery. He's not having any chest pain or significant shortness of breath.   No Known Allergies  Current Outpatient Prescriptions  Medication Sig Dispense Refill  . aspirin 81 MG tablet Take 81 mg by mouth daily.        . B Complex-C-Folic Acid (NEPHRO-VITE PO) Take 1 tablet by mouth daily.        . calcium acetate (PHOSLO) 667 MG capsule Take 1,334 mg by mouth 3 (three) times daily with meals. And 1 with snacks       . cinacalcet (SENSIPAR) 90 MG tablet Take 90 mg by mouth daily.      . nitroGLYCERIN (NITROSTAT) 0.4 MG SL tablet Place 0.4 mg under the tongue every 5 (five) minutes as needed. For chest       . sevelamer (RENVELA) 800 MG tablet Take 800-1,600 mg by mouth 3 (three) times daily with meals. Take 2 tablets with meals and 1 tablet with snacks.        History   Social History  . Marital Status: Married    Spouse Name: N/A    Number of Children: N/A  . Years of Education: N/A   Occupational History  . Not on file.   Social History Main Topics  . Smoking status: Former Smoker    Types: Cigarettes    Quit date: 04/18/1994  . Smokeless tobacco: Current User    Types: Snuff   Comment: dips 1/2 snuff per day times 25 years  . Alcohol Use: No  . Drug Use: No  . Sexually Active: Not on file   Other Topics Concern  . Not on file   Social History Narrative   Single    Family History  Problem Relation Age of Onset  .  Hypertension Mother   . Heart disease Mother   . Hypertension Father   . Heart disease Father     Past Medical History  Diagnosis Date  . Hypertension   . Orthostatic hypotension      2008  . Chest discomfort      2008  . Sinus tachycardia      2008, TSH normal  . Ejection fraction      65%, echo, 2008   /   EF 55-60%, echo, October 28, 2010  . Overweight   . Syncope      positional after dialysis... 2008  . Aneurysm      Right arm fistula 3 aneurysms 2011,   plans to have a new procedure  . CHF (congestive heart failure)   . Leg pain   . Angina     occasional, last 6 mo ago  . Dialysis patient     M-W-F @ [facility]  . ESRD (end stage renal disease)     on hemodialysis M-W-F [facility]    Past Surgical History  Procedure Date  . Right arm graft     for dyalisis  . Umbilical hernia repair   . Thrombectomy   .  Arteriovenous graft placement   . Av fistula placement   . Thrombectomy w/ embolectomy 03/01/2011    Procedure: THROMBECTOMY ARTERIOVENOUS GORE-TEX GRAFT;  Surgeon: Elam Dutch, MD;  Location: [place];  Service: Vascular;  Laterality: Right;  Attempted Thrombectomy of Old  Right Upper Arm Arteriovenous gortex Graft. Insertion of new Arteriovenous Graft using 39mm x 50cm Gortex Stretch graft.   . Insertion of dialysis catheter 03/01/2011    Procedure: INSERTION OF DIALYSIS CATHETER;  Surgeon: Elam Dutch, MD;  Location: [place];  Service: Vascular;  Laterality: Left;  Exchange of Dialysis Catheter to 27cm 15Fr. Arrow Catheter  . Thrombectomy w/ embolectomy 07/11/2011    Procedure: THROMBECTOMY ARTERIOVENOUS GORE-TEX GRAFT;  Surgeon: Rosetta Posner, MD;  Location: [facility];  Service: Vascular;  Laterality: Right;    ROS Patient denies fever, chills, headache, sweats, rash, change in vision, change in hearing, chest pain, cough, nausea vomiting, urinary symptoms. All other systems are reviewed and are negative.  PHYSICAL EXAM Patient is overweight but quite stable. There is  no jugulovenous distention. There no carotid bruits. Lungs are clear. Respiratory effort is nonlabored. Cardiac exam reveals S1 and S2. There no clicks or significant murmurs. The abdomen is soft but protuberant. There is no musculoskeletal deformity. There is no peripheral edema. There no significant skin rashes.  Filed Vitals:   09/06/11 1332  BP: 105/75  Pulse: 86  Height: 6' (1.829 m)  Weight: 346 lb (156.945 kg)   EKG is done today and reviewed by me. I have compared it to a recent tracing in May, 2013. I've also compared to a tracing of 2012. On the current tracing the patient has good anterior R wave progression. This is similar to 2012. The tracing earlier this month had shown decreased R wave in V2. Overall there is no significant change in the EKG.  ASSESSMENT & PLAN

## 2011-09-06 NOTE — Assessment & Plan Note (Signed)
The patient has had mild persistent sinus tachycardia. His echo in 2012 showed normal LV function. I have not been able to find a followup TSH. This will be done if I cannot find one.

## 2011-09-06 NOTE — Assessment & Plan Note (Signed)
He is not having any recurrent problems with orthostatic hypotension.

## 2011-09-07 DIAGNOSIS — N2581 Secondary hyperparathyroidism of renal origin: Secondary | ICD-10-CM | POA: Diagnosis not present

## 2011-09-07 DIAGNOSIS — N186 End stage renal disease: Secondary | ICD-10-CM | POA: Diagnosis not present

## 2011-09-07 DIAGNOSIS — D509 Iron deficiency anemia, unspecified: Secondary | ICD-10-CM | POA: Diagnosis not present

## 2011-09-09 DIAGNOSIS — D509 Iron deficiency anemia, unspecified: Secondary | ICD-10-CM | POA: Diagnosis not present

## 2011-09-09 DIAGNOSIS — N186 End stage renal disease: Secondary | ICD-10-CM | POA: Diagnosis not present

## 2011-09-09 DIAGNOSIS — N2581 Secondary hyperparathyroidism of renal origin: Secondary | ICD-10-CM | POA: Diagnosis not present

## 2011-09-12 DIAGNOSIS — N186 End stage renal disease: Secondary | ICD-10-CM | POA: Diagnosis not present

## 2011-09-12 DIAGNOSIS — N2581 Secondary hyperparathyroidism of renal origin: Secondary | ICD-10-CM | POA: Diagnosis not present

## 2011-09-12 DIAGNOSIS — D509 Iron deficiency anemia, unspecified: Secondary | ICD-10-CM | POA: Diagnosis not present

## 2011-09-14 ENCOUNTER — Encounter: Payer: Self-pay | Admitting: Vascular Surgery

## 2011-09-14 DIAGNOSIS — N2581 Secondary hyperparathyroidism of renal origin: Secondary | ICD-10-CM | POA: Diagnosis not present

## 2011-09-14 DIAGNOSIS — D509 Iron deficiency anemia, unspecified: Secondary | ICD-10-CM | POA: Diagnosis not present

## 2011-09-14 DIAGNOSIS — N186 End stage renal disease: Secondary | ICD-10-CM | POA: Diagnosis not present

## 2011-09-15 ENCOUNTER — Telehealth: Payer: Self-pay | Admitting: *Deleted

## 2011-09-15 ENCOUNTER — Ambulatory Visit: Payer: Medicare Other | Admitting: Vascular Surgery

## 2011-09-15 NOTE — Telephone Encounter (Signed)
Patient informed via voicemail.

## 2011-09-15 NOTE — Telephone Encounter (Signed)
Message copied by Merlene Laughter on Thu Sep 15, 2011 10:22 AM ------      Message from: [place], [place] D      Created: Tue Sep 13, 2011  1:29 PM       The thyroid test is normal

## 2011-09-16 DIAGNOSIS — D509 Iron deficiency anemia, unspecified: Secondary | ICD-10-CM | POA: Diagnosis not present

## 2011-09-16 DIAGNOSIS — N186 End stage renal disease: Secondary | ICD-10-CM | POA: Diagnosis not present

## 2011-09-16 DIAGNOSIS — N2581 Secondary hyperparathyroidism of renal origin: Secondary | ICD-10-CM | POA: Diagnosis not present

## 2011-09-19 DIAGNOSIS — N2581 Secondary hyperparathyroidism of renal origin: Secondary | ICD-10-CM | POA: Diagnosis not present

## 2011-09-19 DIAGNOSIS — N186 End stage renal disease: Secondary | ICD-10-CM | POA: Diagnosis not present

## 2011-09-19 DIAGNOSIS — D509 Iron deficiency anemia, unspecified: Secondary | ICD-10-CM | POA: Diagnosis not present

## 2011-09-21 DIAGNOSIS — N2581 Secondary hyperparathyroidism of renal origin: Secondary | ICD-10-CM | POA: Diagnosis not present

## 2011-09-21 DIAGNOSIS — N186 End stage renal disease: Secondary | ICD-10-CM | POA: Diagnosis not present

## 2011-09-21 DIAGNOSIS — D509 Iron deficiency anemia, unspecified: Secondary | ICD-10-CM | POA: Diagnosis not present

## 2011-09-23 DIAGNOSIS — N186 End stage renal disease: Secondary | ICD-10-CM | POA: Diagnosis not present

## 2011-09-23 DIAGNOSIS — D509 Iron deficiency anemia, unspecified: Secondary | ICD-10-CM | POA: Diagnosis not present

## 2011-09-23 DIAGNOSIS — N2581 Secondary hyperparathyroidism of renal origin: Secondary | ICD-10-CM | POA: Diagnosis not present

## 2011-09-26 DIAGNOSIS — N2581 Secondary hyperparathyroidism of renal origin: Secondary | ICD-10-CM | POA: Diagnosis not present

## 2011-09-26 DIAGNOSIS — D509 Iron deficiency anemia, unspecified: Secondary | ICD-10-CM | POA: Diagnosis not present

## 2011-09-26 DIAGNOSIS — N186 End stage renal disease: Secondary | ICD-10-CM | POA: Diagnosis not present

## 2011-09-28 DIAGNOSIS — N2581 Secondary hyperparathyroidism of renal origin: Secondary | ICD-10-CM | POA: Diagnosis not present

## 2011-09-28 DIAGNOSIS — N186 End stage renal disease: Secondary | ICD-10-CM | POA: Diagnosis not present

## 2011-09-28 DIAGNOSIS — D509 Iron deficiency anemia, unspecified: Secondary | ICD-10-CM | POA: Diagnosis not present

## 2011-09-30 DIAGNOSIS — D509 Iron deficiency anemia, unspecified: Secondary | ICD-10-CM | POA: Diagnosis not present

## 2011-09-30 DIAGNOSIS — N186 End stage renal disease: Secondary | ICD-10-CM | POA: Diagnosis not present

## 2011-09-30 DIAGNOSIS — N2581 Secondary hyperparathyroidism of renal origin: Secondary | ICD-10-CM | POA: Diagnosis not present

## 2011-10-03 DIAGNOSIS — N2581 Secondary hyperparathyroidism of renal origin: Secondary | ICD-10-CM | POA: Diagnosis not present

## 2011-10-03 DIAGNOSIS — N186 End stage renal disease: Secondary | ICD-10-CM | POA: Diagnosis not present

## 2011-10-03 DIAGNOSIS — D509 Iron deficiency anemia, unspecified: Secondary | ICD-10-CM | POA: Diagnosis not present

## 2011-10-05 DIAGNOSIS — N2581 Secondary hyperparathyroidism of renal origin: Secondary | ICD-10-CM | POA: Diagnosis not present

## 2011-10-05 DIAGNOSIS — D509 Iron deficiency anemia, unspecified: Secondary | ICD-10-CM | POA: Diagnosis not present

## 2011-10-05 DIAGNOSIS — N186 End stage renal disease: Secondary | ICD-10-CM | POA: Diagnosis not present

## 2011-10-07 DIAGNOSIS — D509 Iron deficiency anemia, unspecified: Secondary | ICD-10-CM | POA: Diagnosis not present

## 2011-10-07 DIAGNOSIS — N186 End stage renal disease: Secondary | ICD-10-CM | POA: Diagnosis not present

## 2011-10-07 DIAGNOSIS — N2581 Secondary hyperparathyroidism of renal origin: Secondary | ICD-10-CM | POA: Diagnosis not present

## 2011-10-10 DIAGNOSIS — N2581 Secondary hyperparathyroidism of renal origin: Secondary | ICD-10-CM | POA: Diagnosis not present

## 2011-10-10 DIAGNOSIS — D509 Iron deficiency anemia, unspecified: Secondary | ICD-10-CM | POA: Diagnosis not present

## 2011-10-10 DIAGNOSIS — N186 End stage renal disease: Secondary | ICD-10-CM | POA: Diagnosis not present

## 2011-10-12 DIAGNOSIS — D509 Iron deficiency anemia, unspecified: Secondary | ICD-10-CM | POA: Diagnosis not present

## 2011-10-12 DIAGNOSIS — N2581 Secondary hyperparathyroidism of renal origin: Secondary | ICD-10-CM | POA: Diagnosis not present

## 2011-10-12 DIAGNOSIS — N186 End stage renal disease: Secondary | ICD-10-CM | POA: Diagnosis not present

## 2011-10-14 DIAGNOSIS — N186 End stage renal disease: Secondary | ICD-10-CM | POA: Diagnosis not present

## 2011-10-14 DIAGNOSIS — N2581 Secondary hyperparathyroidism of renal origin: Secondary | ICD-10-CM | POA: Diagnosis not present

## 2011-10-14 DIAGNOSIS — D509 Iron deficiency anemia, unspecified: Secondary | ICD-10-CM | POA: Diagnosis not present

## 2011-10-16 DIAGNOSIS — N186 End stage renal disease: Secondary | ICD-10-CM | POA: Diagnosis not present

## 2011-10-17 DIAGNOSIS — N186 End stage renal disease: Secondary | ICD-10-CM | POA: Diagnosis not present

## 2011-10-17 DIAGNOSIS — N2581 Secondary hyperparathyroidism of renal origin: Secondary | ICD-10-CM | POA: Diagnosis not present

## 2011-10-17 DIAGNOSIS — D509 Iron deficiency anemia, unspecified: Secondary | ICD-10-CM | POA: Diagnosis not present

## 2011-10-19 ENCOUNTER — Telehealth: Payer: Self-pay | Admitting: Vascular Surgery

## 2011-10-19 DIAGNOSIS — N2581 Secondary hyperparathyroidism of renal origin: Secondary | ICD-10-CM | POA: Diagnosis not present

## 2011-10-19 DIAGNOSIS — D509 Iron deficiency anemia, unspecified: Secondary | ICD-10-CM | POA: Diagnosis not present

## 2011-10-19 DIAGNOSIS — N186 End stage renal disease: Secondary | ICD-10-CM | POA: Diagnosis not present

## 2011-10-19 NOTE — Telephone Encounter (Addendum)
Message copied by Lujean Amel on Wed Oct 19, 2011  4:20 PM ------      Message from: Denman George      Created: Wed Oct 19, 2011  3:47 PM      Regarding: needs appt w/ [facility]       Call rec'd from [facility] for Dr. Lowanda Foster at [facility] [place]/ inquiring about plan for pt. to have permanent access placed/ I informed them that pt. "no showed" his last appt. 09/15/11.  Please reschedule pt. To see Dr. Oneida Alar the next avail. To discuss access placement.  He shouldn't need any vasc. Lab, since he had a central venogram 08/23/11.  Please contact Apolonio Schneiders @ [phone]/ [facility] to give appt.  I scheduled an appt for the above patient on Thurs 11/17/11 at 1:15pm with [facility]. Gave appt information to Crystal @ [facility] and she stated she would inform pt of this. Shawn Stall

## 2011-10-21 DIAGNOSIS — N186 End stage renal disease: Secondary | ICD-10-CM | POA: Diagnosis not present

## 2011-10-21 DIAGNOSIS — N2581 Secondary hyperparathyroidism of renal origin: Secondary | ICD-10-CM | POA: Diagnosis not present

## 2011-10-21 DIAGNOSIS — D509 Iron deficiency anemia, unspecified: Secondary | ICD-10-CM | POA: Diagnosis not present

## 2011-10-24 DIAGNOSIS — N186 End stage renal disease: Secondary | ICD-10-CM | POA: Diagnosis not present

## 2011-10-24 DIAGNOSIS — N2581 Secondary hyperparathyroidism of renal origin: Secondary | ICD-10-CM | POA: Diagnosis not present

## 2011-10-24 DIAGNOSIS — D509 Iron deficiency anemia, unspecified: Secondary | ICD-10-CM | POA: Diagnosis not present

## 2011-10-26 DIAGNOSIS — N186 End stage renal disease: Secondary | ICD-10-CM | POA: Diagnosis not present

## 2011-10-26 DIAGNOSIS — D509 Iron deficiency anemia, unspecified: Secondary | ICD-10-CM | POA: Diagnosis not present

## 2011-10-26 DIAGNOSIS — N2581 Secondary hyperparathyroidism of renal origin: Secondary | ICD-10-CM | POA: Diagnosis not present

## 2011-10-28 DIAGNOSIS — N186 End stage renal disease: Secondary | ICD-10-CM | POA: Diagnosis not present

## 2011-10-28 DIAGNOSIS — D509 Iron deficiency anemia, unspecified: Secondary | ICD-10-CM | POA: Diagnosis not present

## 2011-10-28 DIAGNOSIS — N2581 Secondary hyperparathyroidism of renal origin: Secondary | ICD-10-CM | POA: Diagnosis not present

## 2011-10-31 DIAGNOSIS — N186 End stage renal disease: Secondary | ICD-10-CM | POA: Diagnosis not present

## 2011-10-31 DIAGNOSIS — D509 Iron deficiency anemia, unspecified: Secondary | ICD-10-CM | POA: Diagnosis not present

## 2011-10-31 DIAGNOSIS — N2581 Secondary hyperparathyroidism of renal origin: Secondary | ICD-10-CM | POA: Diagnosis not present

## 2011-11-02 DIAGNOSIS — N186 End stage renal disease: Secondary | ICD-10-CM | POA: Diagnosis not present

## 2011-11-02 DIAGNOSIS — N2581 Secondary hyperparathyroidism of renal origin: Secondary | ICD-10-CM | POA: Diagnosis not present

## 2011-11-02 DIAGNOSIS — D509 Iron deficiency anemia, unspecified: Secondary | ICD-10-CM | POA: Diagnosis not present

## 2011-11-04 DIAGNOSIS — N2581 Secondary hyperparathyroidism of renal origin: Secondary | ICD-10-CM | POA: Diagnosis not present

## 2011-11-04 DIAGNOSIS — D509 Iron deficiency anemia, unspecified: Secondary | ICD-10-CM | POA: Diagnosis not present

## 2011-11-04 DIAGNOSIS — N186 End stage renal disease: Secondary | ICD-10-CM | POA: Diagnosis not present

## 2011-11-07 DIAGNOSIS — D509 Iron deficiency anemia, unspecified: Secondary | ICD-10-CM | POA: Diagnosis not present

## 2011-11-07 DIAGNOSIS — N186 End stage renal disease: Secondary | ICD-10-CM | POA: Diagnosis not present

## 2011-11-07 DIAGNOSIS — N2581 Secondary hyperparathyroidism of renal origin: Secondary | ICD-10-CM | POA: Diagnosis not present

## 2011-11-09 DIAGNOSIS — D509 Iron deficiency anemia, unspecified: Secondary | ICD-10-CM | POA: Diagnosis not present

## 2011-11-09 DIAGNOSIS — N186 End stage renal disease: Secondary | ICD-10-CM | POA: Diagnosis not present

## 2011-11-09 DIAGNOSIS — N2581 Secondary hyperparathyroidism of renal origin: Secondary | ICD-10-CM | POA: Diagnosis not present

## 2011-11-11 DIAGNOSIS — N2581 Secondary hyperparathyroidism of renal origin: Secondary | ICD-10-CM | POA: Diagnosis not present

## 2011-11-11 DIAGNOSIS — D509 Iron deficiency anemia, unspecified: Secondary | ICD-10-CM | POA: Diagnosis not present

## 2011-11-11 DIAGNOSIS — N186 End stage renal disease: Secondary | ICD-10-CM | POA: Diagnosis not present

## 2011-11-14 DIAGNOSIS — N186 End stage renal disease: Secondary | ICD-10-CM | POA: Diagnosis not present

## 2011-11-14 DIAGNOSIS — D509 Iron deficiency anemia, unspecified: Secondary | ICD-10-CM | POA: Diagnosis not present

## 2011-11-14 DIAGNOSIS — N2581 Secondary hyperparathyroidism of renal origin: Secondary | ICD-10-CM | POA: Diagnosis not present

## 2011-11-16 ENCOUNTER — Encounter: Payer: Self-pay | Admitting: Vascular Surgery

## 2011-11-16 DIAGNOSIS — D509 Iron deficiency anemia, unspecified: Secondary | ICD-10-CM | POA: Diagnosis not present

## 2011-11-16 DIAGNOSIS — N2581 Secondary hyperparathyroidism of renal origin: Secondary | ICD-10-CM | POA: Diagnosis not present

## 2011-11-16 DIAGNOSIS — N186 End stage renal disease: Secondary | ICD-10-CM | POA: Diagnosis not present

## 2011-11-17 ENCOUNTER — Ambulatory Visit: Payer: Medicare Other | Admitting: Vascular Surgery

## 2011-11-18 DIAGNOSIS — N186 End stage renal disease: Secondary | ICD-10-CM | POA: Diagnosis not present

## 2011-11-18 DIAGNOSIS — D509 Iron deficiency anemia, unspecified: Secondary | ICD-10-CM | POA: Diagnosis not present

## 2011-11-18 DIAGNOSIS — N2581 Secondary hyperparathyroidism of renal origin: Secondary | ICD-10-CM | POA: Diagnosis not present

## 2011-11-21 DIAGNOSIS — N186 End stage renal disease: Secondary | ICD-10-CM | POA: Diagnosis not present

## 2011-11-21 DIAGNOSIS — D509 Iron deficiency anemia, unspecified: Secondary | ICD-10-CM | POA: Diagnosis not present

## 2011-11-21 DIAGNOSIS — N2581 Secondary hyperparathyroidism of renal origin: Secondary | ICD-10-CM | POA: Diagnosis not present

## 2011-11-23 DIAGNOSIS — D509 Iron deficiency anemia, unspecified: Secondary | ICD-10-CM | POA: Diagnosis not present

## 2011-11-23 DIAGNOSIS — N186 End stage renal disease: Secondary | ICD-10-CM | POA: Diagnosis not present

## 2011-11-23 DIAGNOSIS — N2581 Secondary hyperparathyroidism of renal origin: Secondary | ICD-10-CM | POA: Diagnosis not present

## 2011-11-25 DIAGNOSIS — D509 Iron deficiency anemia, unspecified: Secondary | ICD-10-CM | POA: Diagnosis not present

## 2011-11-25 DIAGNOSIS — N186 End stage renal disease: Secondary | ICD-10-CM | POA: Diagnosis not present

## 2011-11-25 DIAGNOSIS — N2581 Secondary hyperparathyroidism of renal origin: Secondary | ICD-10-CM | POA: Diagnosis not present

## 2011-11-28 DIAGNOSIS — N186 End stage renal disease: Secondary | ICD-10-CM | POA: Diagnosis not present

## 2011-11-28 DIAGNOSIS — N2581 Secondary hyperparathyroidism of renal origin: Secondary | ICD-10-CM | POA: Diagnosis not present

## 2011-11-28 DIAGNOSIS — D509 Iron deficiency anemia, unspecified: Secondary | ICD-10-CM | POA: Diagnosis not present

## 2011-11-30 DIAGNOSIS — D509 Iron deficiency anemia, unspecified: Secondary | ICD-10-CM | POA: Diagnosis not present

## 2011-11-30 DIAGNOSIS — N2581 Secondary hyperparathyroidism of renal origin: Secondary | ICD-10-CM | POA: Diagnosis not present

## 2011-11-30 DIAGNOSIS — N186 End stage renal disease: Secondary | ICD-10-CM | POA: Diagnosis not present

## 2011-12-02 DIAGNOSIS — D509 Iron deficiency anemia, unspecified: Secondary | ICD-10-CM | POA: Diagnosis not present

## 2011-12-02 DIAGNOSIS — N186 End stage renal disease: Secondary | ICD-10-CM | POA: Diagnosis not present

## 2011-12-02 DIAGNOSIS — N2581 Secondary hyperparathyroidism of renal origin: Secondary | ICD-10-CM | POA: Diagnosis not present

## 2011-12-05 DIAGNOSIS — N2581 Secondary hyperparathyroidism of renal origin: Secondary | ICD-10-CM | POA: Diagnosis not present

## 2011-12-05 DIAGNOSIS — D509 Iron deficiency anemia, unspecified: Secondary | ICD-10-CM | POA: Diagnosis not present

## 2011-12-05 DIAGNOSIS — N186 End stage renal disease: Secondary | ICD-10-CM | POA: Diagnosis not present

## 2011-12-07 ENCOUNTER — Encounter: Payer: Self-pay | Admitting: Vascular Surgery

## 2011-12-07 DIAGNOSIS — D509 Iron deficiency anemia, unspecified: Secondary | ICD-10-CM | POA: Diagnosis not present

## 2011-12-07 DIAGNOSIS — N186 End stage renal disease: Secondary | ICD-10-CM | POA: Diagnosis not present

## 2011-12-07 DIAGNOSIS — N2581 Secondary hyperparathyroidism of renal origin: Secondary | ICD-10-CM | POA: Diagnosis not present

## 2011-12-08 ENCOUNTER — Ambulatory Visit: Payer: Medicare Other | Admitting: Vascular Surgery

## 2011-12-09 DIAGNOSIS — N186 End stage renal disease: Secondary | ICD-10-CM | POA: Diagnosis not present

## 2011-12-09 DIAGNOSIS — N2581 Secondary hyperparathyroidism of renal origin: Secondary | ICD-10-CM | POA: Diagnosis not present

## 2011-12-09 DIAGNOSIS — D509 Iron deficiency anemia, unspecified: Secondary | ICD-10-CM | POA: Diagnosis not present

## 2011-12-12 DIAGNOSIS — D509 Iron deficiency anemia, unspecified: Secondary | ICD-10-CM | POA: Diagnosis not present

## 2011-12-12 DIAGNOSIS — N186 End stage renal disease: Secondary | ICD-10-CM | POA: Diagnosis not present

## 2011-12-12 DIAGNOSIS — N2581 Secondary hyperparathyroidism of renal origin: Secondary | ICD-10-CM | POA: Diagnosis not present

## 2011-12-14 DIAGNOSIS — D509 Iron deficiency anemia, unspecified: Secondary | ICD-10-CM | POA: Diagnosis not present

## 2011-12-14 DIAGNOSIS — N2581 Secondary hyperparathyroidism of renal origin: Secondary | ICD-10-CM | POA: Diagnosis not present

## 2011-12-14 DIAGNOSIS — N186 End stage renal disease: Secondary | ICD-10-CM | POA: Diagnosis not present

## 2011-12-16 DIAGNOSIS — N186 End stage renal disease: Secondary | ICD-10-CM | POA: Diagnosis not present

## 2011-12-16 DIAGNOSIS — N2581 Secondary hyperparathyroidism of renal origin: Secondary | ICD-10-CM | POA: Diagnosis not present

## 2011-12-16 DIAGNOSIS — D509 Iron deficiency anemia, unspecified: Secondary | ICD-10-CM | POA: Diagnosis not present

## 2011-12-17 DIAGNOSIS — N186 End stage renal disease: Secondary | ICD-10-CM | POA: Diagnosis not present

## 2011-12-19 DIAGNOSIS — D509 Iron deficiency anemia, unspecified: Secondary | ICD-10-CM | POA: Diagnosis not present

## 2011-12-19 DIAGNOSIS — Z23 Encounter for immunization: Secondary | ICD-10-CM | POA: Diagnosis not present

## 2011-12-19 DIAGNOSIS — N186 End stage renal disease: Secondary | ICD-10-CM | POA: Diagnosis not present

## 2011-12-19 DIAGNOSIS — N2581 Secondary hyperparathyroidism of renal origin: Secondary | ICD-10-CM | POA: Diagnosis not present

## 2011-12-21 ENCOUNTER — Encounter: Payer: Self-pay | Admitting: Vascular Surgery

## 2011-12-21 DIAGNOSIS — N186 End stage renal disease: Secondary | ICD-10-CM | POA: Diagnosis not present

## 2011-12-22 ENCOUNTER — Encounter: Payer: Self-pay | Admitting: Vascular Surgery

## 2011-12-22 ENCOUNTER — Ambulatory Visit (INDEPENDENT_AMBULATORY_CARE_PROVIDER_SITE_OTHER): Payer: BC Managed Care – PPO | Admitting: Vascular Surgery

## 2011-12-22 VITALS — BP 134/85 | HR 110 | Resp 18 | Ht 72.0 in | Wt 339.6 lb

## 2011-12-22 DIAGNOSIS — N186 End stage renal disease: Secondary | ICD-10-CM

## 2011-12-22 NOTE — Progress Notes (Signed)
Patient is a 46 year old male who center evaluation for possible placement of a new hemodialysis access. The patient has had multiple prior upper extremity access procedures. He is currently dialyzing via a right upper arm AV graft. This is high enough that it can no longer be surgically revised. It has been declotted on at least 2 prior occasions. The patient recently had a central venogram on partner Dr. 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  He states that the high-pressure occasionally alarmed hemodialysis machine but overall he thinks he has been getting good clearance. His kidney doctor is Dr. Hinda Lenis.  Review of systems: He denies shortness of breath. He denies chest pain.  Physical exam: Filed Vitals:   12/22/11 1352  BP: 134/85  Pulse: 110  Resp: 18  Height: 6' (1.829 m)  Weight: 339 lb 9.6 oz (154.042 kg)  SpO2: 98%   Right upper extremity: Audible bruit and right upper arm AV graft, left upper extremity multiple scars from prior access procedures Abdomen: Obese with large pannus overlapping each groin  Assessment:  Difficult access situation. Believe the best option at this point would be to continue to use the right upper arm graft. If this fails in the near future he could potentially go back to interventional radiology for further thrombolysis. However, he is not a candidate for further surgical revision of this. He is not a candidate for further graft in the left arm due to central venous occlusion. I believe the best option at this point would be to continue to follow him until the right upper arm graft occludes and isn't salvageable. At that point we would place a temporary catheter in the femoral position. We would then evaluate him for a right upper extremity HERO graft. The patient is quite reluctant to put in a thigh graft at this point so I believe a HERO is his only option otherwise.  Plan: See above  Ruta Hinds,  MD [facility] Office: [phone] Pager: [phone]

## 2011-12-31 DIAGNOSIS — N186 End stage renal disease: Secondary | ICD-10-CM | POA: Diagnosis not present

## 2011-12-31 DIAGNOSIS — Z992 Dependence on renal dialysis: Secondary | ICD-10-CM | POA: Diagnosis not present

## 2011-12-31 DIAGNOSIS — N2581 Secondary hyperparathyroidism of renal origin: Secondary | ICD-10-CM | POA: Diagnosis not present

## 2012-01-03 DIAGNOSIS — N2581 Secondary hyperparathyroidism of renal origin: Secondary | ICD-10-CM | POA: Diagnosis not present

## 2012-01-03 DIAGNOSIS — N186 End stage renal disease: Secondary | ICD-10-CM | POA: Diagnosis not present

## 2012-01-03 DIAGNOSIS — Z992 Dependence on renal dialysis: Secondary | ICD-10-CM | POA: Diagnosis not present

## 2012-01-05 DIAGNOSIS — Z992 Dependence on renal dialysis: Secondary | ICD-10-CM | POA: Diagnosis not present

## 2012-01-05 DIAGNOSIS — N186 End stage renal disease: Secondary | ICD-10-CM | POA: Diagnosis not present

## 2012-01-05 DIAGNOSIS — N2581 Secondary hyperparathyroidism of renal origin: Secondary | ICD-10-CM | POA: Diagnosis not present

## 2012-01-10 DIAGNOSIS — N2581 Secondary hyperparathyroidism of renal origin: Secondary | ICD-10-CM | POA: Diagnosis not present

## 2012-01-10 DIAGNOSIS — N186 End stage renal disease: Secondary | ICD-10-CM | POA: Diagnosis not present

## 2012-01-10 DIAGNOSIS — Z992 Dependence on renal dialysis: Secondary | ICD-10-CM | POA: Diagnosis not present

## 2012-01-12 DIAGNOSIS — Z992 Dependence on renal dialysis: Secondary | ICD-10-CM | POA: Diagnosis not present

## 2012-01-12 DIAGNOSIS — N186 End stage renal disease: Secondary | ICD-10-CM | POA: Diagnosis not present

## 2012-01-12 DIAGNOSIS — N2581 Secondary hyperparathyroidism of renal origin: Secondary | ICD-10-CM | POA: Diagnosis not present

## 2012-01-14 DIAGNOSIS — Z992 Dependence on renal dialysis: Secondary | ICD-10-CM | POA: Diagnosis not present

## 2012-01-14 DIAGNOSIS — N2581 Secondary hyperparathyroidism of renal origin: Secondary | ICD-10-CM | POA: Diagnosis not present

## 2012-01-14 DIAGNOSIS — N186 End stage renal disease: Secondary | ICD-10-CM | POA: Diagnosis not present

## 2012-01-16 DIAGNOSIS — N186 End stage renal disease: Secondary | ICD-10-CM | POA: Diagnosis not present

## 2012-01-17 DIAGNOSIS — Z992 Dependence on renal dialysis: Secondary | ICD-10-CM | POA: Diagnosis not present

## 2012-01-17 DIAGNOSIS — N186 End stage renal disease: Secondary | ICD-10-CM | POA: Diagnosis not present

## 2012-01-17 DIAGNOSIS — N2581 Secondary hyperparathyroidism of renal origin: Secondary | ICD-10-CM | POA: Diagnosis not present

## 2012-01-17 DIAGNOSIS — D509 Iron deficiency anemia, unspecified: Secondary | ICD-10-CM | POA: Diagnosis not present

## 2012-01-26 DIAGNOSIS — N186 End stage renal disease: Secondary | ICD-10-CM | POA: Diagnosis not present

## 2012-02-09 DIAGNOSIS — N186 End stage renal disease: Secondary | ICD-10-CM | POA: Diagnosis not present

## 2012-02-16 DIAGNOSIS — N186 End stage renal disease: Secondary | ICD-10-CM | POA: Diagnosis not present

## 2012-02-18 DIAGNOSIS — N2581 Secondary hyperparathyroidism of renal origin: Secondary | ICD-10-CM | POA: Diagnosis not present

## 2012-02-18 DIAGNOSIS — N186 End stage renal disease: Secondary | ICD-10-CM | POA: Diagnosis not present

## 2012-02-18 DIAGNOSIS — D509 Iron deficiency anemia, unspecified: Secondary | ICD-10-CM | POA: Diagnosis not present

## 2012-02-23 DIAGNOSIS — N186 End stage renal disease: Secondary | ICD-10-CM | POA: Diagnosis not present

## 2012-03-17 DIAGNOSIS — N186 End stage renal disease: Secondary | ICD-10-CM | POA: Diagnosis not present

## 2012-03-20 DIAGNOSIS — N2581 Secondary hyperparathyroidism of renal origin: Secondary | ICD-10-CM | POA: Diagnosis not present

## 2012-03-20 DIAGNOSIS — D509 Iron deficiency anemia, unspecified: Secondary | ICD-10-CM | POA: Diagnosis not present

## 2012-03-20 DIAGNOSIS — N186 End stage renal disease: Secondary | ICD-10-CM | POA: Diagnosis not present

## 2012-03-22 DIAGNOSIS — N186 End stage renal disease: Secondary | ICD-10-CM | POA: Diagnosis not present

## 2012-03-22 DIAGNOSIS — D509 Iron deficiency anemia, unspecified: Secondary | ICD-10-CM | POA: Diagnosis not present

## 2012-03-22 DIAGNOSIS — N2581 Secondary hyperparathyroidism of renal origin: Secondary | ICD-10-CM | POA: Diagnosis not present

## 2012-03-24 DIAGNOSIS — N186 End stage renal disease: Secondary | ICD-10-CM | POA: Diagnosis not present

## 2012-03-24 DIAGNOSIS — N2581 Secondary hyperparathyroidism of renal origin: Secondary | ICD-10-CM | POA: Diagnosis not present

## 2012-03-24 DIAGNOSIS — D509 Iron deficiency anemia, unspecified: Secondary | ICD-10-CM | POA: Diagnosis not present

## 2012-03-27 DIAGNOSIS — D509 Iron deficiency anemia, unspecified: Secondary | ICD-10-CM | POA: Diagnosis not present

## 2012-03-27 DIAGNOSIS — N2581 Secondary hyperparathyroidism of renal origin: Secondary | ICD-10-CM | POA: Diagnosis not present

## 2012-03-27 DIAGNOSIS — N186 End stage renal disease: Secondary | ICD-10-CM | POA: Diagnosis not present

## 2012-03-29 DIAGNOSIS — N2581 Secondary hyperparathyroidism of renal origin: Secondary | ICD-10-CM | POA: Diagnosis not present

## 2012-03-29 DIAGNOSIS — D509 Iron deficiency anemia, unspecified: Secondary | ICD-10-CM | POA: Diagnosis not present

## 2012-03-29 DIAGNOSIS — N186 End stage renal disease: Secondary | ICD-10-CM | POA: Diagnosis not present

## 2012-03-31 DIAGNOSIS — N186 End stage renal disease: Secondary | ICD-10-CM | POA: Diagnosis not present

## 2012-03-31 DIAGNOSIS — D509 Iron deficiency anemia, unspecified: Secondary | ICD-10-CM | POA: Diagnosis not present

## 2012-03-31 DIAGNOSIS — N2581 Secondary hyperparathyroidism of renal origin: Secondary | ICD-10-CM | POA: Diagnosis not present

## 2012-04-03 DIAGNOSIS — N2581 Secondary hyperparathyroidism of renal origin: Secondary | ICD-10-CM | POA: Diagnosis not present

## 2012-04-03 DIAGNOSIS — D509 Iron deficiency anemia, unspecified: Secondary | ICD-10-CM | POA: Diagnosis not present

## 2012-04-03 DIAGNOSIS — N186 End stage renal disease: Secondary | ICD-10-CM | POA: Diagnosis not present

## 2012-04-06 ENCOUNTER — Encounter (HOSPITAL_COMMUNITY): Payer: Self-pay | Admitting: *Deleted

## 2012-04-06 ENCOUNTER — Emergency Department (HOSPITAL_COMMUNITY)
Admission: EM | Admit: 2012-04-06 | Discharge: 2012-04-06 | Disposition: A | Discharge status: Home / Self Care | Payer: Medicare Other | Attending: Emergency Medicine | Admitting: Emergency Medicine

## 2012-04-06 DIAGNOSIS — I509 Heart failure, unspecified: Secondary | ICD-10-CM | POA: Insufficient documentation

## 2012-04-06 DIAGNOSIS — Z79899 Other long term (current) drug therapy: Secondary | ICD-10-CM | POA: Insufficient documentation

## 2012-04-06 DIAGNOSIS — Y849 Medical procedure, unspecified as the cause of abnormal reaction of the patient, or of later complication, without mention of misadventure at the time of the procedure: Secondary | ICD-10-CM | POA: Insufficient documentation

## 2012-04-06 DIAGNOSIS — E875 Hyperkalemia: Secondary | ICD-10-CM | POA: Diagnosis not present

## 2012-04-06 DIAGNOSIS — I771 Stricture of artery: Secondary | ICD-10-CM | POA: Diagnosis not present

## 2012-04-06 DIAGNOSIS — E663 Overweight: Secondary | ICD-10-CM | POA: Insufficient documentation

## 2012-04-06 DIAGNOSIS — N186 End stage renal disease: Secondary | ICD-10-CM | POA: Insufficient documentation

## 2012-04-06 DIAGNOSIS — Z8679 Personal history of other diseases of the circulatory system: Secondary | ICD-10-CM | POA: Insufficient documentation

## 2012-04-06 DIAGNOSIS — Z7982 Long term (current) use of aspirin: Secondary | ICD-10-CM | POA: Diagnosis not present

## 2012-04-06 DIAGNOSIS — T82898A Other specified complication of vascular prosthetic devices, implants and grafts, initial encounter: Secondary | ICD-10-CM | POA: Diagnosis not present

## 2012-04-06 DIAGNOSIS — Z992 Dependence on renal dialysis: Secondary | ICD-10-CM | POA: Insufficient documentation

## 2012-04-06 DIAGNOSIS — Z87891 Personal history of nicotine dependence: Secondary | ICD-10-CM | POA: Insufficient documentation

## 2012-04-06 DIAGNOSIS — I120 Hypertensive chronic kidney disease with stage 5 chronic kidney disease or end stage renal disease: Secondary | ICD-10-CM | POA: Diagnosis not present

## 2012-04-06 DIAGNOSIS — E876 Hypokalemia: Secondary | ICD-10-CM | POA: Diagnosis not present

## 2012-04-06 DIAGNOSIS — I871 Compression of vein: Secondary | ICD-10-CM | POA: Diagnosis not present

## 2012-04-06 DIAGNOSIS — I10 Essential (primary) hypertension: Secondary | ICD-10-CM | POA: Diagnosis not present

## 2012-04-06 LAB — BASIC METABOLIC PANEL
CO2: 22 mEq/L (ref 19–32)
Calcium: 8.6 mg/dL (ref 8.4–10.5)
Chloride: 92 mEq/L — ABNORMAL LOW (ref 96–112)
Creatinine, Ser: 17.82 mg/dL — ABNORMAL HIGH (ref 0.50–1.35)
Glucose, Bld: 87 mg/dL (ref 70–99)
Sodium: 136 mEq/L (ref 135–145)

## 2012-04-06 LAB — CBC WITH DIFFERENTIAL/PLATELET
Eosinophils Absolute: 0.5 10*3/uL (ref 0.0–0.7)
Eosinophils Relative: 5 % (ref 0–5)
HCT: 44.5 % (ref 39.0–52.0)
Lymphocytes Relative: 18 % (ref 12–46)
Lymphs Abs: 1.6 10*3/uL (ref 0.7–4.0)
MCH: 25.8 pg — ABNORMAL LOW (ref 26.0–34.0)
MCV: 80.3 fL (ref 78.0–100.0)
Monocytes Absolute: 0.4 10*3/uL (ref 0.1–1.0)
RBC: 5.54 MIL/uL (ref 4.22–5.81)
WBC: 8.9 10*3/uL (ref 4.0–10.5)

## 2012-04-06 MED ORDER — SODIUM POLYSTYRENE SULFONATE 15 GM/60ML PO SUSP
30.0000 g | Freq: Once | ORAL | Status: AC
  Administered 2012-04-06: 30 g via ORAL
  Filled 2012-04-06: qty 120

## 2012-04-06 NOTE — ED Notes (Signed)
Pt reports clotted AV fistula to right upper arm. Pt denies pain or SOB. Last full dialysis treatment was tues. Pt NAD resting in bed watching TV waiting for attending physician

## 2012-04-06 NOTE — ED Provider Notes (Signed)
History     CSN: [id number]  Arrival date & time 04/06/12  1400   First MD Initiated Contact with Patient 04/06/12 1749      Chief Complaint  Patient presents with  . Vascular Access Problem    (Consider location/radiation/quality/duration/timing/severity/associated sxs/prior treatment) HPI This patient with history of end-stage renal disease presents with concerns over a nonfunctional AV fistula.  The patient's last dialysis session was 3 days ago.  He was scheduled yesterday.  The patient's fistula was not functional yesterday.  Today he was referred to another facility for revision of the fistula.  After that was performed, seemingly successfully, the patient returned to his dialysis center.  After the fistula did not function a began, he was referred here for evaluation.  The patient denies any physical complaints, any fever, any nausea, any anorexia, any focal concerns. Past Medical History  Diagnosis Date  . Hypertension   . Orthostatic hypotension      2008  . Chest discomfort      2008  . Sinus tachycardia      2008, TSH normal  . Ejection fraction      65%, echo, 2008   /   EF 55-60%, echo, October 28, 2010  . Overweight   . Syncope      positional after dialysis... 2008  . Aneurysm      Right arm fistula 3 aneurysms 2011,   plans to have a new procedure  . CHF (congestive heart failure)   . Leg pain   . Angina     occasional, last 6 mo ago  . Dialysis patient     M-W-F @ [facility]  . ESRD (end stage renal disease)     on hemodialysis M-W-F [facility]    Past Surgical History  Procedure Date  . Right arm graft     for dyalisis  . Umbilical hernia repair   . Thrombectomy   . Arteriovenous graft placement   . Av fistula placement   . Thrombectomy w/ embolectomy 03/01/2011    Procedure: THROMBECTOMY ARTERIOVENOUS GORE-TEX GRAFT;  Surgeon: Elam Dutch, MD;  Location: [place];  Service: Vascular;  Laterality: Right;  Attempted Thrombectomy of Old  Right Upper Arm  Arteriovenous gortex Graft. Insertion of new Arteriovenous Graft using 84mm x 50cm Gortex Stretch graft.   . Insertion of dialysis catheter 03/01/2011    Procedure: INSERTION OF DIALYSIS CATHETER;  Surgeon: Elam Dutch, MD;  Location: [place];  Service: Vascular;  Laterality: Left;  Exchange of Dialysis Catheter to 27cm 15Fr. Arrow Catheter  . Thrombectomy w/ embolectomy 07/11/2011    Procedure: THROMBECTOMY ARTERIOVENOUS GORE-TEX GRAFT;  Surgeon: Rosetta Posner, MD;  Location: [facility] OR;  Service: Vascular;  Laterality: Right;    Family History  Problem Relation Age of Onset  . Hypertension Mother   . Heart disease Mother   . Hypertension Father   . Heart disease Father     History  Substance Use Topics  . Smoking status: Former Smoker    Types: Cigarettes    Quit date: 04/18/1994  . Smokeless tobacco: Current User    Types: Snuff     Comment: dips 1/2 snuff per day times 25 years  . Alcohol Use: No      Review of Systems  Constitutional:       Per HPI, otherwise negative  HENT:       Per HPI, otherwise negative  Eyes: Negative.   Respiratory:       Per  HPI, otherwise negative  Cardiovascular:       Per HPI, otherwise negative  Gastrointestinal: Negative for vomiting.  Genitourinary: Negative.   Musculoskeletal:       Per HPI, otherwise negative  Skin: Negative.   Neurological: Negative for syncope.    Allergies  Review of patient's allergies indicates no known allergies.  Home Medications   Current Outpatient Rx  Name  Route  Sig  Dispense  Refill  . ASPIRIN 81 MG PO TABS   Oral   Take 81 mg by mouth daily.           [name] NEPHRO-VITE PO   Oral   Take 1 tablet by mouth daily.           [name] CALCIUM ACETATE 667 MG PO CAPS   Oral   Take 1,334 mg by mouth 3 (three) times daily with meals. And 1 with snacks          . CINACALCET HCL 90 MG PO TABS   Oral   Take 90 mg by mouth daily.         [name] NITROGLYCERIN 0.4 MG SL SUBL   Sublingual   Place 0.4 mg  under the tongue every 5 (five) minutes as needed. For chest          . SEVELAMER CARBONATE 800 MG PO TABS   Oral   Take 800-1,600 mg by mouth 3 (three) times daily with meals. Take 2 tablets with meals and 1 tablet with snacks.           BP 129/89  Pulse 87  Temp 98.2 F (36.8 C) (Oral)  Resp 16  SpO2 100%  Physical Exam  Nursing note and vitals reviewed. Constitutional: He is oriented to person, place, and time. He appears well-developed. No distress.  HENT:  Head: Normocephalic and atraumatic.  Eyes: Conjunctivae normal and EOM are normal.  Cardiovascular: Normal rate and regular rhythm.   Pulmonary/Chest: Effort normal. No stridor. No respiratory distress.  Abdominal: He exhibits no distension.  Musculoskeletal: He exhibits no edema.       Arms: Neurological: He is alert and oriented to person, place, and time.  Skin: Skin is warm and dry.  Psychiatric: He has a normal mood and affect.    ED Course  Procedures (including critical care time)  Labs Reviewed  BASIC METABOLIC PANEL - Abnormal; Notable for the following:    Potassium 5.6 (*)     Chloride 92 (*)     BUN 78 (*)     Creatinine, Ser 17.82 (*)     GFR calc non Af Amer 3 (*)     GFR calc Af Amer 3 (*)     All other components within normal limits  CBC WITH DIFFERENTIAL - Abnormal; Notable for the following:    MCH 25.8 (*)     RDW 16.0 (*)     All other components within normal limits   No results found.   No diagnosis found.  Soon after the patient's arrival I discussed this case with our local nephrologist and subsequently our vascular surgeon.  Given that the patient's labs demonstrate hyperkalemia, with no ECG changes, there is no indication for emergent access placement.  I attempted to speak with patient's nephrologist in [place].   This was unsuccessful   Date: 04/06/2012  Rate: 92  Rhythm: normal sinus rhythm  QRS Axis: normal  Intervals: normal  ST/T Wave abnormalities: normal   Conduction Disutrbances: none  Narrative Interpretation: unremarkable  MDM  This generally well-appearing male with end-stage renal disease, now on dialysis presents with concerns of a nonfunctioning fistula.  On exam the patient is in no distress, denying any ongoing complaints.  The patient's vital signs are unremarkable.  The patient does have hyperkalemia on labs, but no ECG changes.  Given his need for new access I discussed his case with several practitioners, each of whom deferred emergent intervention.  Absent distress, with largely reassuring labs, the patient received Kayexalate, was discharged in stable condition to follow up with his nephrologist in the morning.      Carmin Muskrat, MD 04/06/12 2027

## 2012-04-06 NOTE — ED Notes (Signed)
Pt presents with clotted AV fistula.  Pt reports receiving full treatment of dialysis on Tuesday, went to dialysis yesterday-did not receive any treatment -staff was unable to access and unable to palpate thrill.  Pt was in [place] today to have fistula "cleaned out" with +thrill.  Pt reports he was unable to feel or hear bruit today, reports pain beginning to R arm.  Pt is supposed to receive HD tomorrow.

## 2012-04-07 DIAGNOSIS — Z79899 Other long term (current) drug therapy: Secondary | ICD-10-CM | POA: Diagnosis not present

## 2012-04-08 DIAGNOSIS — Z79899 Other long term (current) drug therapy: Secondary | ICD-10-CM | POA: Diagnosis not present

## 2012-04-09 DIAGNOSIS — N186 End stage renal disease: Secondary | ICD-10-CM | POA: Diagnosis not present

## 2012-04-09 DIAGNOSIS — D509 Iron deficiency anemia, unspecified: Secondary | ICD-10-CM | POA: Diagnosis not present

## 2012-04-09 DIAGNOSIS — I771 Stricture of artery: Secondary | ICD-10-CM | POA: Diagnosis not present

## 2012-04-09 DIAGNOSIS — T82898A Other specified complication of vascular prosthetic devices, implants and grafts, initial encounter: Secondary | ICD-10-CM | POA: Diagnosis not present

## 2012-04-09 DIAGNOSIS — N2581 Secondary hyperparathyroidism of renal origin: Secondary | ICD-10-CM | POA: Diagnosis not present

## 2012-04-09 DIAGNOSIS — I871 Compression of vein: Secondary | ICD-10-CM | POA: Diagnosis not present

## 2012-04-12 DIAGNOSIS — N2581 Secondary hyperparathyroidism of renal origin: Secondary | ICD-10-CM | POA: Diagnosis not present

## 2012-04-12 DIAGNOSIS — D509 Iron deficiency anemia, unspecified: Secondary | ICD-10-CM | POA: Diagnosis not present

## 2012-04-12 DIAGNOSIS — N186 End stage renal disease: Secondary | ICD-10-CM | POA: Diagnosis not present

## 2012-04-14 DIAGNOSIS — D509 Iron deficiency anemia, unspecified: Secondary | ICD-10-CM | POA: Diagnosis not present

## 2012-04-14 DIAGNOSIS — N2581 Secondary hyperparathyroidism of renal origin: Secondary | ICD-10-CM | POA: Diagnosis not present

## 2012-04-14 DIAGNOSIS — N186 End stage renal disease: Secondary | ICD-10-CM | POA: Diagnosis not present

## 2012-04-16 DIAGNOSIS — N186 End stage renal disease: Secondary | ICD-10-CM | POA: Diagnosis not present

## 2012-04-16 DIAGNOSIS — D509 Iron deficiency anemia, unspecified: Secondary | ICD-10-CM | POA: Diagnosis not present

## 2012-04-16 DIAGNOSIS — N2581 Secondary hyperparathyroidism of renal origin: Secondary | ICD-10-CM | POA: Diagnosis not present

## 2012-04-17 DIAGNOSIS — N186 End stage renal disease: Secondary | ICD-10-CM | POA: Diagnosis not present

## 2012-04-19 DIAGNOSIS — D509 Iron deficiency anemia, unspecified: Secondary | ICD-10-CM | POA: Diagnosis not present

## 2012-04-19 DIAGNOSIS — N186 End stage renal disease: Secondary | ICD-10-CM | POA: Diagnosis not present

## 2012-04-19 DIAGNOSIS — Z23 Encounter for immunization: Secondary | ICD-10-CM | POA: Diagnosis not present

## 2012-04-19 DIAGNOSIS — N2581 Secondary hyperparathyroidism of renal origin: Secondary | ICD-10-CM | POA: Diagnosis not present

## 2012-04-21 DIAGNOSIS — N2581 Secondary hyperparathyroidism of renal origin: Secondary | ICD-10-CM | POA: Diagnosis not present

## 2012-04-21 DIAGNOSIS — D509 Iron deficiency anemia, unspecified: Secondary | ICD-10-CM | POA: Diagnosis not present

## 2012-04-21 DIAGNOSIS — Z23 Encounter for immunization: Secondary | ICD-10-CM | POA: Diagnosis not present

## 2012-04-21 DIAGNOSIS — N186 End stage renal disease: Secondary | ICD-10-CM | POA: Diagnosis not present

## 2012-04-24 DIAGNOSIS — D509 Iron deficiency anemia, unspecified: Secondary | ICD-10-CM | POA: Diagnosis not present

## 2012-04-24 DIAGNOSIS — N186 End stage renal disease: Secondary | ICD-10-CM | POA: Diagnosis not present

## 2012-04-24 DIAGNOSIS — N2581 Secondary hyperparathyroidism of renal origin: Secondary | ICD-10-CM | POA: Diagnosis not present

## 2012-04-24 DIAGNOSIS — Z23 Encounter for immunization: Secondary | ICD-10-CM | POA: Diagnosis not present

## 2012-04-26 DIAGNOSIS — N186 End stage renal disease: Secondary | ICD-10-CM | POA: Diagnosis not present

## 2012-04-26 DIAGNOSIS — N2581 Secondary hyperparathyroidism of renal origin: Secondary | ICD-10-CM | POA: Diagnosis not present

## 2012-04-26 DIAGNOSIS — Z23 Encounter for immunization: Secondary | ICD-10-CM | POA: Diagnosis not present

## 2012-04-26 DIAGNOSIS — D509 Iron deficiency anemia, unspecified: Secondary | ICD-10-CM | POA: Diagnosis not present

## 2012-04-28 DIAGNOSIS — N186 End stage renal disease: Secondary | ICD-10-CM | POA: Diagnosis not present

## 2012-04-28 DIAGNOSIS — Z23 Encounter for immunization: Secondary | ICD-10-CM | POA: Diagnosis not present

## 2012-04-28 DIAGNOSIS — D509 Iron deficiency anemia, unspecified: Secondary | ICD-10-CM | POA: Diagnosis not present

## 2012-04-28 DIAGNOSIS — N2581 Secondary hyperparathyroidism of renal origin: Secondary | ICD-10-CM | POA: Diagnosis not present

## 2012-05-01 DIAGNOSIS — D509 Iron deficiency anemia, unspecified: Secondary | ICD-10-CM | POA: Diagnosis not present

## 2012-05-01 DIAGNOSIS — N2581 Secondary hyperparathyroidism of renal origin: Secondary | ICD-10-CM | POA: Diagnosis not present

## 2012-05-01 DIAGNOSIS — Z23 Encounter for immunization: Secondary | ICD-10-CM | POA: Diagnosis not present

## 2012-05-01 DIAGNOSIS — N186 End stage renal disease: Secondary | ICD-10-CM | POA: Diagnosis not present

## 2012-05-03 DIAGNOSIS — N2581 Secondary hyperparathyroidism of renal origin: Secondary | ICD-10-CM | POA: Diagnosis not present

## 2012-05-03 DIAGNOSIS — N186 End stage renal disease: Secondary | ICD-10-CM | POA: Diagnosis not present

## 2012-05-03 DIAGNOSIS — D509 Iron deficiency anemia, unspecified: Secondary | ICD-10-CM | POA: Diagnosis not present

## 2012-05-03 DIAGNOSIS — Z23 Encounter for immunization: Secondary | ICD-10-CM | POA: Diagnosis not present

## 2012-05-05 DIAGNOSIS — N186 End stage renal disease: Secondary | ICD-10-CM | POA: Diagnosis not present

## 2012-05-05 DIAGNOSIS — D509 Iron deficiency anemia, unspecified: Secondary | ICD-10-CM | POA: Diagnosis not present

## 2012-05-05 DIAGNOSIS — N2581 Secondary hyperparathyroidism of renal origin: Secondary | ICD-10-CM | POA: Diagnosis not present

## 2012-05-05 DIAGNOSIS — Z23 Encounter for immunization: Secondary | ICD-10-CM | POA: Diagnosis not present

## 2012-05-08 DIAGNOSIS — Z23 Encounter for immunization: Secondary | ICD-10-CM | POA: Diagnosis not present

## 2012-05-08 DIAGNOSIS — N186 End stage renal disease: Secondary | ICD-10-CM | POA: Diagnosis not present

## 2012-05-08 DIAGNOSIS — D509 Iron deficiency anemia, unspecified: Secondary | ICD-10-CM | POA: Diagnosis not present

## 2012-05-08 DIAGNOSIS — N2581 Secondary hyperparathyroidism of renal origin: Secondary | ICD-10-CM | POA: Diagnosis not present

## 2012-05-10 DIAGNOSIS — N2581 Secondary hyperparathyroidism of renal origin: Secondary | ICD-10-CM | POA: Diagnosis not present

## 2012-05-10 DIAGNOSIS — Z23 Encounter for immunization: Secondary | ICD-10-CM | POA: Diagnosis not present

## 2012-05-10 DIAGNOSIS — D509 Iron deficiency anemia, unspecified: Secondary | ICD-10-CM | POA: Diagnosis not present

## 2012-05-10 DIAGNOSIS — N186 End stage renal disease: Secondary | ICD-10-CM | POA: Diagnosis not present

## 2012-05-12 DIAGNOSIS — N2581 Secondary hyperparathyroidism of renal origin: Secondary | ICD-10-CM | POA: Diagnosis not present

## 2012-05-12 DIAGNOSIS — Z23 Encounter for immunization: Secondary | ICD-10-CM | POA: Diagnosis not present

## 2012-05-12 DIAGNOSIS — N186 End stage renal disease: Secondary | ICD-10-CM | POA: Diagnosis not present

## 2012-05-12 DIAGNOSIS — D509 Iron deficiency anemia, unspecified: Secondary | ICD-10-CM | POA: Diagnosis not present

## 2012-05-14 DIAGNOSIS — N186 End stage renal disease: Secondary | ICD-10-CM | POA: Diagnosis not present

## 2012-05-14 DIAGNOSIS — T82898A Other specified complication of vascular prosthetic devices, implants and grafts, initial encounter: Secondary | ICD-10-CM | POA: Diagnosis not present

## 2012-05-15 DIAGNOSIS — Z23 Encounter for immunization: Secondary | ICD-10-CM | POA: Diagnosis not present

## 2012-05-15 DIAGNOSIS — N186 End stage renal disease: Secondary | ICD-10-CM | POA: Diagnosis not present

## 2012-05-15 DIAGNOSIS — D509 Iron deficiency anemia, unspecified: Secondary | ICD-10-CM | POA: Diagnosis not present

## 2012-05-15 DIAGNOSIS — N2581 Secondary hyperparathyroidism of renal origin: Secondary | ICD-10-CM | POA: Diagnosis not present

## 2012-05-17 DIAGNOSIS — N186 End stage renal disease: Secondary | ICD-10-CM | POA: Diagnosis not present

## 2012-05-17 DIAGNOSIS — Z23 Encounter for immunization: Secondary | ICD-10-CM | POA: Diagnosis not present

## 2012-05-17 DIAGNOSIS — N2581 Secondary hyperparathyroidism of renal origin: Secondary | ICD-10-CM | POA: Diagnosis not present

## 2012-05-17 DIAGNOSIS — D509 Iron deficiency anemia, unspecified: Secondary | ICD-10-CM | POA: Diagnosis not present

## 2012-05-18 DIAGNOSIS — N186 End stage renal disease: Secondary | ICD-10-CM | POA: Diagnosis not present

## 2012-05-19 DIAGNOSIS — D509 Iron deficiency anemia, unspecified: Secondary | ICD-10-CM | POA: Diagnosis not present

## 2012-05-19 DIAGNOSIS — N2581 Secondary hyperparathyroidism of renal origin: Secondary | ICD-10-CM | POA: Diagnosis not present

## 2012-05-19 DIAGNOSIS — N186 End stage renal disease: Secondary | ICD-10-CM | POA: Diagnosis not present

## 2012-05-22 DIAGNOSIS — D509 Iron deficiency anemia, unspecified: Secondary | ICD-10-CM | POA: Diagnosis not present

## 2012-05-22 DIAGNOSIS — N186 End stage renal disease: Secondary | ICD-10-CM | POA: Diagnosis not present

## 2012-05-22 DIAGNOSIS — N2581 Secondary hyperparathyroidism of renal origin: Secondary | ICD-10-CM | POA: Diagnosis not present

## 2012-05-24 DIAGNOSIS — N2581 Secondary hyperparathyroidism of renal origin: Secondary | ICD-10-CM | POA: Diagnosis not present

## 2012-05-24 DIAGNOSIS — N186 End stage renal disease: Secondary | ICD-10-CM | POA: Diagnosis not present

## 2012-05-24 DIAGNOSIS — D509 Iron deficiency anemia, unspecified: Secondary | ICD-10-CM | POA: Diagnosis not present

## 2012-05-26 DIAGNOSIS — N2581 Secondary hyperparathyroidism of renal origin: Secondary | ICD-10-CM | POA: Diagnosis not present

## 2012-05-26 DIAGNOSIS — D509 Iron deficiency anemia, unspecified: Secondary | ICD-10-CM | POA: Diagnosis not present

## 2012-05-26 DIAGNOSIS — N186 End stage renal disease: Secondary | ICD-10-CM | POA: Diagnosis not present

## 2012-05-29 DIAGNOSIS — D509 Iron deficiency anemia, unspecified: Secondary | ICD-10-CM | POA: Diagnosis not present

## 2012-05-29 DIAGNOSIS — N2581 Secondary hyperparathyroidism of renal origin: Secondary | ICD-10-CM | POA: Diagnosis not present

## 2012-05-29 DIAGNOSIS — N186 End stage renal disease: Secondary | ICD-10-CM | POA: Diagnosis not present

## 2012-06-03 DIAGNOSIS — N186 End stage renal disease: Secondary | ICD-10-CM | POA: Diagnosis not present

## 2012-06-03 DIAGNOSIS — N2581 Secondary hyperparathyroidism of renal origin: Secondary | ICD-10-CM | POA: Diagnosis not present

## 2012-06-03 DIAGNOSIS — D509 Iron deficiency anemia, unspecified: Secondary | ICD-10-CM | POA: Diagnosis not present

## 2012-06-05 DIAGNOSIS — D509 Iron deficiency anemia, unspecified: Secondary | ICD-10-CM | POA: Diagnosis not present

## 2012-06-05 DIAGNOSIS — N186 End stage renal disease: Secondary | ICD-10-CM | POA: Diagnosis not present

## 2012-06-05 DIAGNOSIS — N2581 Secondary hyperparathyroidism of renal origin: Secondary | ICD-10-CM | POA: Diagnosis not present

## 2012-06-07 DIAGNOSIS — D509 Iron deficiency anemia, unspecified: Secondary | ICD-10-CM | POA: Diagnosis not present

## 2012-06-07 DIAGNOSIS — N186 End stage renal disease: Secondary | ICD-10-CM | POA: Diagnosis not present

## 2012-06-07 DIAGNOSIS — N2581 Secondary hyperparathyroidism of renal origin: Secondary | ICD-10-CM | POA: Diagnosis not present

## 2012-06-09 DIAGNOSIS — N2581 Secondary hyperparathyroidism of renal origin: Secondary | ICD-10-CM | POA: Diagnosis not present

## 2012-06-09 DIAGNOSIS — N186 End stage renal disease: Secondary | ICD-10-CM | POA: Diagnosis not present

## 2012-06-09 DIAGNOSIS — D509 Iron deficiency anemia, unspecified: Secondary | ICD-10-CM | POA: Diagnosis not present

## 2012-06-12 DIAGNOSIS — N186 End stage renal disease: Secondary | ICD-10-CM | POA: Diagnosis not present

## 2012-06-12 DIAGNOSIS — D509 Iron deficiency anemia, unspecified: Secondary | ICD-10-CM | POA: Diagnosis not present

## 2012-06-12 DIAGNOSIS — N2581 Secondary hyperparathyroidism of renal origin: Secondary | ICD-10-CM | POA: Diagnosis not present

## 2012-06-14 DIAGNOSIS — D509 Iron deficiency anemia, unspecified: Secondary | ICD-10-CM | POA: Diagnosis not present

## 2012-06-14 DIAGNOSIS — N2581 Secondary hyperparathyroidism of renal origin: Secondary | ICD-10-CM | POA: Diagnosis not present

## 2012-06-14 DIAGNOSIS — N186 End stage renal disease: Secondary | ICD-10-CM | POA: Diagnosis not present

## 2012-06-15 DIAGNOSIS — N186 End stage renal disease: Secondary | ICD-10-CM | POA: Diagnosis not present

## 2012-06-16 DIAGNOSIS — N2581 Secondary hyperparathyroidism of renal origin: Secondary | ICD-10-CM | POA: Diagnosis not present

## 2012-06-16 DIAGNOSIS — N186 End stage renal disease: Secondary | ICD-10-CM | POA: Diagnosis not present

## 2012-06-16 DIAGNOSIS — D509 Iron deficiency anemia, unspecified: Secondary | ICD-10-CM | POA: Diagnosis not present

## 2012-06-19 DIAGNOSIS — D509 Iron deficiency anemia, unspecified: Secondary | ICD-10-CM | POA: Diagnosis not present

## 2012-06-19 DIAGNOSIS — N2581 Secondary hyperparathyroidism of renal origin: Secondary | ICD-10-CM | POA: Diagnosis not present

## 2012-06-19 DIAGNOSIS — N186 End stage renal disease: Secondary | ICD-10-CM | POA: Diagnosis not present

## 2012-06-21 DIAGNOSIS — N2581 Secondary hyperparathyroidism of renal origin: Secondary | ICD-10-CM | POA: Diagnosis not present

## 2012-06-21 DIAGNOSIS — N186 End stage renal disease: Secondary | ICD-10-CM | POA: Diagnosis not present

## 2012-06-21 DIAGNOSIS — D509 Iron deficiency anemia, unspecified: Secondary | ICD-10-CM | POA: Diagnosis not present

## 2012-06-26 DIAGNOSIS — N2581 Secondary hyperparathyroidism of renal origin: Secondary | ICD-10-CM | POA: Diagnosis not present

## 2012-06-26 DIAGNOSIS — N186 End stage renal disease: Secondary | ICD-10-CM | POA: Diagnosis not present

## 2012-06-26 DIAGNOSIS — D509 Iron deficiency anemia, unspecified: Secondary | ICD-10-CM | POA: Diagnosis not present

## 2012-06-28 DIAGNOSIS — N2581 Secondary hyperparathyroidism of renal origin: Secondary | ICD-10-CM | POA: Diagnosis not present

## 2012-06-28 DIAGNOSIS — D509 Iron deficiency anemia, unspecified: Secondary | ICD-10-CM | POA: Diagnosis not present

## 2012-06-28 DIAGNOSIS — N186 End stage renal disease: Secondary | ICD-10-CM | POA: Diagnosis not present

## 2012-06-30 DIAGNOSIS — D509 Iron deficiency anemia, unspecified: Secondary | ICD-10-CM | POA: Diagnosis not present

## 2012-06-30 DIAGNOSIS — N2581 Secondary hyperparathyroidism of renal origin: Secondary | ICD-10-CM | POA: Diagnosis not present

## 2012-06-30 DIAGNOSIS — N186 End stage renal disease: Secondary | ICD-10-CM | POA: Diagnosis not present

## 2012-07-03 DIAGNOSIS — N2581 Secondary hyperparathyroidism of renal origin: Secondary | ICD-10-CM | POA: Diagnosis not present

## 2012-07-03 DIAGNOSIS — N186 End stage renal disease: Secondary | ICD-10-CM | POA: Diagnosis not present

## 2012-07-05 DIAGNOSIS — N186 End stage renal disease: Secondary | ICD-10-CM | POA: Diagnosis not present

## 2012-07-05 DIAGNOSIS — D509 Iron deficiency anemia, unspecified: Secondary | ICD-10-CM | POA: Diagnosis not present

## 2012-07-05 DIAGNOSIS — N2581 Secondary hyperparathyroidism of renal origin: Secondary | ICD-10-CM | POA: Diagnosis not present

## 2012-07-07 DIAGNOSIS — N2581 Secondary hyperparathyroidism of renal origin: Secondary | ICD-10-CM | POA: Diagnosis not present

## 2012-07-07 DIAGNOSIS — D509 Iron deficiency anemia, unspecified: Secondary | ICD-10-CM | POA: Diagnosis not present

## 2012-07-07 DIAGNOSIS — N186 End stage renal disease: Secondary | ICD-10-CM | POA: Diagnosis not present

## 2012-07-10 DIAGNOSIS — I771 Stricture of artery: Secondary | ICD-10-CM | POA: Diagnosis not present

## 2012-07-10 DIAGNOSIS — T82898A Other specified complication of vascular prosthetic devices, implants and grafts, initial encounter: Secondary | ICD-10-CM | POA: Diagnosis not present

## 2012-07-10 DIAGNOSIS — N186 End stage renal disease: Secondary | ICD-10-CM | POA: Diagnosis not present

## 2012-07-10 DIAGNOSIS — I871 Compression of vein: Secondary | ICD-10-CM | POA: Diagnosis not present

## 2012-07-12 DIAGNOSIS — N186 End stage renal disease: Secondary | ICD-10-CM | POA: Diagnosis not present

## 2012-07-12 DIAGNOSIS — D509 Iron deficiency anemia, unspecified: Secondary | ICD-10-CM | POA: Diagnosis not present

## 2012-07-12 DIAGNOSIS — N2581 Secondary hyperparathyroidism of renal origin: Secondary | ICD-10-CM | POA: Diagnosis not present

## 2012-07-14 DIAGNOSIS — N2581 Secondary hyperparathyroidism of renal origin: Secondary | ICD-10-CM | POA: Diagnosis not present

## 2012-07-14 DIAGNOSIS — N186 End stage renal disease: Secondary | ICD-10-CM | POA: Diagnosis not present

## 2012-07-14 DIAGNOSIS — D509 Iron deficiency anemia, unspecified: Secondary | ICD-10-CM | POA: Diagnosis not present

## 2012-07-16 DIAGNOSIS — N186 End stage renal disease: Secondary | ICD-10-CM | POA: Diagnosis not present

## 2012-07-17 DIAGNOSIS — N186 End stage renal disease: Secondary | ICD-10-CM | POA: Diagnosis not present

## 2012-07-19 DIAGNOSIS — N186 End stage renal disease: Secondary | ICD-10-CM | POA: Diagnosis not present

## 2012-07-21 DIAGNOSIS — N186 End stage renal disease: Secondary | ICD-10-CM | POA: Diagnosis not present

## 2012-07-24 DIAGNOSIS — N186 End stage renal disease: Secondary | ICD-10-CM | POA: Diagnosis not present

## 2012-07-26 DIAGNOSIS — N186 End stage renal disease: Secondary | ICD-10-CM | POA: Diagnosis not present

## 2012-07-31 DIAGNOSIS — N186 End stage renal disease: Secondary | ICD-10-CM | POA: Diagnosis not present

## 2012-08-02 DIAGNOSIS — N186 End stage renal disease: Secondary | ICD-10-CM | POA: Diagnosis not present

## 2012-08-04 DIAGNOSIS — N186 End stage renal disease: Secondary | ICD-10-CM | POA: Diagnosis not present

## 2012-08-06 DIAGNOSIS — N186 End stage renal disease: Secondary | ICD-10-CM | POA: Diagnosis not present

## 2012-08-06 DIAGNOSIS — I871 Compression of vein: Secondary | ICD-10-CM | POA: Diagnosis not present

## 2012-08-06 DIAGNOSIS — T82898A Other specified complication of vascular prosthetic devices, implants and grafts, initial encounter: Secondary | ICD-10-CM | POA: Diagnosis not present

## 2012-08-08 ENCOUNTER — Other Ambulatory Visit (HOSPITAL_COMMUNITY): Payer: Self-pay | Admitting: Nephrology

## 2012-08-08 ENCOUNTER — Ambulatory Visit (HOSPITAL_COMMUNITY)
Admission: RE | Admit: 2012-08-08 | Discharge: 2012-08-08 | Disposition: A | Discharge status: Home / Self Care | Payer: BC Managed Care – PPO | Source: Ambulatory Visit | Attending: Nephrology | Admitting: Nephrology

## 2012-08-08 DIAGNOSIS — I209 Angina pectoris, unspecified: Secondary | ICD-10-CM | POA: Insufficient documentation

## 2012-08-08 DIAGNOSIS — Z7982 Long term (current) use of aspirin: Secondary | ICD-10-CM | POA: Insufficient documentation

## 2012-08-08 DIAGNOSIS — I871 Compression of vein: Secondary | ICD-10-CM | POA: Diagnosis not present

## 2012-08-08 DIAGNOSIS — Z992 Dependence on renal dialysis: Secondary | ICD-10-CM | POA: Insufficient documentation

## 2012-08-08 DIAGNOSIS — I120 Hypertensive chronic kidney disease with stage 5 chronic kidney disease or end stage renal disease: Secondary | ICD-10-CM | POA: Insufficient documentation

## 2012-08-08 DIAGNOSIS — N186 End stage renal disease: Secondary | ICD-10-CM | POA: Insufficient documentation

## 2012-08-08 DIAGNOSIS — I509 Heart failure, unspecified: Secondary | ICD-10-CM | POA: Insufficient documentation

## 2012-08-08 DIAGNOSIS — Z87891 Personal history of nicotine dependence: Secondary | ICD-10-CM | POA: Insufficient documentation

## 2012-08-08 DIAGNOSIS — Z7902 Long term (current) use of antithrombotics/antiplatelets: Secondary | ICD-10-CM | POA: Insufficient documentation

## 2012-08-08 DIAGNOSIS — I498 Other specified cardiac arrhythmias: Secondary | ICD-10-CM | POA: Insufficient documentation

## 2012-08-08 DIAGNOSIS — I771 Stricture of artery: Secondary | ICD-10-CM | POA: Diagnosis not present

## 2012-08-08 DIAGNOSIS — Z452 Encounter for adjustment and management of vascular access device: Secondary | ICD-10-CM | POA: Diagnosis not present

## 2012-08-08 DIAGNOSIS — I951 Orthostatic hypotension: Secondary | ICD-10-CM | POA: Insufficient documentation

## 2012-08-08 DIAGNOSIS — T82898A Other specified complication of vascular prosthetic devices, implants and grafts, initial encounter: Secondary | ICD-10-CM | POA: Diagnosis not present

## 2012-08-08 DIAGNOSIS — Z79899 Other long term (current) drug therapy: Secondary | ICD-10-CM | POA: Insufficient documentation

## 2012-08-08 MED ORDER — GELATIN ABSORBABLE 12-7 MM EX MISC
CUTANEOUS | Status: AC
  Filled 2012-08-08: qty 1

## 2012-08-08 MED ORDER — FENTANYL CITRATE 0.05 MG/ML IJ SOLN
INTRAMUSCULAR | Status: AC
  Filled 2012-08-08: qty 4

## 2012-08-08 MED ORDER — FENTANYL CITRATE 0.05 MG/ML IJ SOLN
INTRAMUSCULAR | Status: AC | PRN
  Administered 2012-08-08: 50 ug via INTRAVENOUS

## 2012-08-08 MED ORDER — CEFAZOLIN SODIUM-DEXTROSE 2-3 GM-% IV SOLR
INTRAVENOUS | Status: AC
  Administered 2012-08-08: 2 g via INTRAVENOUS
  Filled 2012-08-08: qty 50

## 2012-08-08 MED ORDER — HEPARIN SODIUM (PORCINE) 1000 UNIT/ML IJ SOLN
INTRAMUSCULAR | Status: AC | PRN
  Administered 2012-08-08: 4000 [IU] via INTRAVENOUS

## 2012-08-08 MED ORDER — MIDAZOLAM HCL 2 MG/2ML IJ SOLN
INTRAMUSCULAR | Status: AC | PRN
  Administered 2012-08-08 (×2): 1 mg via INTRAVENOUS

## 2012-08-08 MED ORDER — MIDAZOLAM HCL 2 MG/2ML IJ SOLN
INTRAMUSCULAR | Status: AC
  Filled 2012-08-08: qty 4

## 2012-08-08 MED ORDER — CEFAZOLIN SODIUM-DEXTROSE 2-3 GM-% IV SOLR: 2.0000 g | INTRAVENOUS | Status: AC

## 2012-08-08 MED ORDER — HEPARIN SODIUM (PORCINE) 1000 UNIT/ML IJ SOLN
INTRAMUSCULAR | Status: AC
  Filled 2012-08-08: qty 1

## 2012-08-08 NOTE — H&P (Signed)
Agree with PA note.    Signed,  Meir Elwood K. Carney Saxton, MD Vascular & Interventional Radiologist [facility]  

## 2012-08-08 NOTE — Procedures (Signed)
Interventional Radiology Procedure Note  Procedure: Placement of a Right IJ 23 cm HemoSplit tunneled HD catheter.  Tip in the upper RA and ready for use. Complications: None Recommendations: - May begin dialysis - Sutures out in 14 days  Signed,  Criselda Peaches, MD Vascular & Interventional Radiologist [facility]

## 2012-08-08 NOTE — H&P (Signed)
[name] [name] is an 47 y.o. male.   Chief Complaint: "I'm here for a dialysis catheter" HPI: Patient with ESRD and poorly functioning/clotted right arm dialysis access/fistula presents today for placement of a chest wall dialysis catheter.  Past Medical History  Diagnosis Date  . Hypertension   . Orthostatic hypotension      2008  . Chest discomfort      2008  . Sinus tachycardia      2008, TSH normal  . Ejection fraction      65%, echo, 2008   /   EF 55-60%, echo, October 28, 2010  . Overweight   . Syncope      positional after dialysis... 2008  . Aneurysm      Right arm fistula 3 aneurysms 2011,   plans to have a new procedure  . CHF (congestive heart failure)   . Leg pain   . Angina     occasional, last 6 mo ago  . Dialysis patient     M-W-F @ [facility]  . ESRD (end stage renal disease)     on hemodialysis M-W-F [facility]    Past Surgical History  Procedure Laterality Date  . Right arm graft      for dyalisis  . Umbilical hernia repair    . Thrombectomy    . Arteriovenous graft placement    . Av fistula placement    . Thrombectomy w/ embolectomy  03/01/2011    Procedure: THROMBECTOMY ARTERIOVENOUS GORE-TEX GRAFT;  Surgeon: Elam Dutch, MD;  Location: [place];  Service: Vascular;  Laterality: Right;  Attempted Thrombectomy of Old  Right Upper Arm Arteriovenous gortex Graft. Insertion of new Arteriovenous Graft using 29mm x 50cm Gortex Stretch graft.   . Insertion of dialysis catheter  03/01/2011    Procedure: INSERTION OF DIALYSIS CATHETER;  Surgeon: Elam Dutch, MD;  Location: [place];  Service: Vascular;  Laterality: Left;  Exchange of Dialysis Catheter to 27cm 15Fr. Arrow Catheter  . Thrombectomy w/ embolectomy  07/11/2011    Procedure: THROMBECTOMY ARTERIOVENOUS GORE-TEX GRAFT;  Surgeon: Rosetta Posner, MD;  Location: [facility] OR;  Service: Vascular;  Laterality: Right;    Family History  Problem Relation Age of Onset  . Hypertension Mother   . Heart disease Mother   .  Hypertension Father   . Heart disease Father    Social History:  reports that he quit smoking about 18 years ago. His smoking use included Cigarettes. He smoked 0.00 packs per day. His smokeless tobacco use includes Snuff. He reports that he does not drink alcohol or use illicit drugs.  Allergies: No Known Allergies  Current outpatient prescriptions:aspirin 81 MG tablet, Take 81 mg by mouth daily.  , Disp: , Rfl: ;  B Complex-C-Folic Acid (NEPHRO-VITE PO), Take 1 tablet by mouth daily.  , Disp: , Rfl: ;  calcium acetate (PHOSLO) 667 MG capsule, Take 1,334 mg by mouth 3 (three) times daily with meals. And 1 with snacks , Disp: , Rfl: ;  cinacalcet (SENSIPAR) 90 MG tablet, Take 90 mg by mouth daily., Disp: , Rfl:  clopidogrel (PLAVIX) 300 MG TABS, Take 300 mg by mouth once., Disp: , Rfl: ;  clopidogrel (PLAVIX) 75 MG tablet, Take by mouth daily. To start on Plavix daily, does not know dose as prescription is in car. Had a 4 tablet load this morning, Disp: , Rfl: ;  nitroGLYCERIN (NITROSTAT) 0.4 MG SL tablet, Place 0.4 mg under the tongue every 5 (five) minutes as  needed. For chest , Disp: , Rfl:  sevelamer (RENVELA) 800 MG tablet, Take 800-1,600 mg by mouth 3 (three) times daily with meals. Take 2 tablets with meals and 1 tablet with snacks., Disp: , Rfl:    Results for orders placed during the hospital encounter of 08/08/12 (from the past 48 hour(s))  PROTIME-INR     Status: None   Collection Time    08/08/12  2:30 PM      Result Value Range   Prothrombin Time 13.6  11.6 - 15.2 seconds   INR 1.05  0.00 - 1.49  APTT     Status: None   Collection Time    08/08/12  2:30 PM      Result Value Range   aPTT 30  24 - 37 seconds  POTASSIUM     Status: None   Collection Time    08/08/12  2:30 PM      Result Value Range   Potassium 5.0  3.5 - 5.1 mEq/L   Comment: HEMOLYSIS AT THIS LEVEL MAY AFFECT RESULT   No results found.  Review of Systems  Constitutional: Negative for fever and chills.   Respiratory: Negative for cough and shortness of breath.   Cardiovascular: Negative for chest pain.  Gastrointestinal: Negative for nausea, vomiting and abdominal pain.  Musculoskeletal: Negative for back pain.  Neurological: Negative for headaches.    Blood pressure 131/71, pulse 88, temperature 98.1 F (36.7 C), temperature source Oral, resp. rate 16, SpO2 95.00%. Physical Exam  Constitutional: He is oriented to person, place, and time. He appears well-developed and well-nourished.  Cardiovascular: Normal rate and regular rhythm.   Rt arm HD access site with no thrill/bruit  Respiratory: Effort normal and breath sounds normal.  GI: Soft. Bowel sounds are normal. There is no tenderness.  obese  Musculoskeletal: Normal range of motion. He exhibits no edema.  Neurological: He is alert and oriented to person, place, and time.     Assessment/Plan Pt with ESRD and recurrent problems (thrombosis, poor flow rates) with HD access/fistula site right arm. Plan is for placement of a chest wall HD catheter today. Details of above d/w pt with his understanding and consent.  Kamera Dubas,D [name] 08/08/2012, 3:12 PM

## 2012-08-09 DIAGNOSIS — N186 End stage renal disease: Secondary | ICD-10-CM | POA: Diagnosis not present

## 2012-08-11 DIAGNOSIS — N186 End stage renal disease: Secondary | ICD-10-CM | POA: Diagnosis not present

## 2012-08-13 DIAGNOSIS — N186 End stage renal disease: Secondary | ICD-10-CM | POA: Diagnosis not present

## 2012-08-13 DIAGNOSIS — I771 Stricture of artery: Secondary | ICD-10-CM | POA: Diagnosis not present

## 2012-08-13 DIAGNOSIS — I871 Compression of vein: Secondary | ICD-10-CM | POA: Diagnosis not present

## 2012-08-13 DIAGNOSIS — T82898A Other specified complication of vascular prosthetic devices, implants and grafts, initial encounter: Secondary | ICD-10-CM | POA: Diagnosis not present

## 2012-08-14 DIAGNOSIS — N186 End stage renal disease: Secondary | ICD-10-CM | POA: Diagnosis not present

## 2012-08-15 DIAGNOSIS — N186 End stage renal disease: Secondary | ICD-10-CM | POA: Diagnosis not present

## 2012-08-16 DIAGNOSIS — D509 Iron deficiency anemia, unspecified: Secondary | ICD-10-CM | POA: Diagnosis not present

## 2012-08-16 DIAGNOSIS — N186 End stage renal disease: Secondary | ICD-10-CM | POA: Diagnosis not present

## 2012-08-18 DIAGNOSIS — D509 Iron deficiency anemia, unspecified: Secondary | ICD-10-CM | POA: Diagnosis not present

## 2012-08-18 DIAGNOSIS — N186 End stage renal disease: Secondary | ICD-10-CM | POA: Diagnosis not present

## 2012-08-21 DIAGNOSIS — D509 Iron deficiency anemia, unspecified: Secondary | ICD-10-CM | POA: Diagnosis not present

## 2012-08-21 DIAGNOSIS — N186 End stage renal disease: Secondary | ICD-10-CM | POA: Diagnosis not present

## 2012-08-23 DIAGNOSIS — D509 Iron deficiency anemia, unspecified: Secondary | ICD-10-CM | POA: Diagnosis not present

## 2012-08-23 DIAGNOSIS — N186 End stage renal disease: Secondary | ICD-10-CM | POA: Diagnosis not present

## 2012-08-25 DIAGNOSIS — D509 Iron deficiency anemia, unspecified: Secondary | ICD-10-CM | POA: Diagnosis not present

## 2012-08-25 DIAGNOSIS — N186 End stage renal disease: Secondary | ICD-10-CM | POA: Diagnosis not present

## 2012-08-28 DIAGNOSIS — D509 Iron deficiency anemia, unspecified: Secondary | ICD-10-CM | POA: Diagnosis not present

## 2012-08-28 DIAGNOSIS — N186 End stage renal disease: Secondary | ICD-10-CM | POA: Diagnosis not present

## 2012-08-30 DIAGNOSIS — N186 End stage renal disease: Secondary | ICD-10-CM | POA: Diagnosis not present

## 2012-08-30 DIAGNOSIS — D509 Iron deficiency anemia, unspecified: Secondary | ICD-10-CM | POA: Diagnosis not present

## 2012-09-01 DIAGNOSIS — N186 End stage renal disease: Secondary | ICD-10-CM | POA: Diagnosis not present

## 2012-09-01 DIAGNOSIS — D509 Iron deficiency anemia, unspecified: Secondary | ICD-10-CM | POA: Diagnosis not present

## 2012-09-03 DIAGNOSIS — N186 End stage renal disease: Secondary | ICD-10-CM | POA: Diagnosis not present

## 2012-09-03 DIAGNOSIS — T82898A Other specified complication of vascular prosthetic devices, implants and grafts, initial encounter: Secondary | ICD-10-CM | POA: Diagnosis not present

## 2012-09-04 DIAGNOSIS — N186 End stage renal disease: Secondary | ICD-10-CM | POA: Diagnosis not present

## 2012-09-04 DIAGNOSIS — D509 Iron deficiency anemia, unspecified: Secondary | ICD-10-CM | POA: Diagnosis not present

## 2012-09-06 DIAGNOSIS — D509 Iron deficiency anemia, unspecified: Secondary | ICD-10-CM | POA: Diagnosis not present

## 2012-09-06 DIAGNOSIS — N186 End stage renal disease: Secondary | ICD-10-CM | POA: Diagnosis not present

## 2012-09-08 DIAGNOSIS — N186 End stage renal disease: Secondary | ICD-10-CM | POA: Diagnosis not present

## 2012-09-08 DIAGNOSIS — D509 Iron deficiency anemia, unspecified: Secondary | ICD-10-CM | POA: Diagnosis not present

## 2012-09-11 DIAGNOSIS — N186 End stage renal disease: Secondary | ICD-10-CM | POA: Diagnosis not present

## 2012-09-11 DIAGNOSIS — D509 Iron deficiency anemia, unspecified: Secondary | ICD-10-CM | POA: Diagnosis not present

## 2012-09-13 DIAGNOSIS — D509 Iron deficiency anemia, unspecified: Secondary | ICD-10-CM | POA: Diagnosis not present

## 2012-09-13 DIAGNOSIS — N186 End stage renal disease: Secondary | ICD-10-CM | POA: Diagnosis not present

## 2012-09-15 DIAGNOSIS — D509 Iron deficiency anemia, unspecified: Secondary | ICD-10-CM | POA: Diagnosis not present

## 2012-09-15 DIAGNOSIS — N186 End stage renal disease: Secondary | ICD-10-CM | POA: Diagnosis not present

## 2012-09-18 DIAGNOSIS — N2581 Secondary hyperparathyroidism of renal origin: Secondary | ICD-10-CM | POA: Diagnosis not present

## 2012-09-18 DIAGNOSIS — D509 Iron deficiency anemia, unspecified: Secondary | ICD-10-CM | POA: Diagnosis not present

## 2012-09-18 DIAGNOSIS — N186 End stage renal disease: Secondary | ICD-10-CM | POA: Diagnosis not present

## 2012-09-20 DIAGNOSIS — N2581 Secondary hyperparathyroidism of renal origin: Secondary | ICD-10-CM | POA: Diagnosis not present

## 2012-09-20 DIAGNOSIS — N186 End stage renal disease: Secondary | ICD-10-CM | POA: Diagnosis not present

## 2012-09-20 DIAGNOSIS — D509 Iron deficiency anemia, unspecified: Secondary | ICD-10-CM | POA: Diagnosis not present

## 2012-09-22 DIAGNOSIS — N186 End stage renal disease: Secondary | ICD-10-CM | POA: Diagnosis not present

## 2012-09-22 DIAGNOSIS — D509 Iron deficiency anemia, unspecified: Secondary | ICD-10-CM | POA: Diagnosis not present

## 2012-09-22 DIAGNOSIS — N2581 Secondary hyperparathyroidism of renal origin: Secondary | ICD-10-CM | POA: Diagnosis not present

## 2012-09-25 DIAGNOSIS — N186 End stage renal disease: Secondary | ICD-10-CM | POA: Diagnosis not present

## 2012-09-25 DIAGNOSIS — D509 Iron deficiency anemia, unspecified: Secondary | ICD-10-CM | POA: Diagnosis not present

## 2012-09-25 DIAGNOSIS — N2581 Secondary hyperparathyroidism of renal origin: Secondary | ICD-10-CM | POA: Diagnosis not present

## 2012-09-27 DIAGNOSIS — N186 End stage renal disease: Secondary | ICD-10-CM | POA: Diagnosis not present

## 2012-09-27 DIAGNOSIS — N2581 Secondary hyperparathyroidism of renal origin: Secondary | ICD-10-CM | POA: Diagnosis not present

## 2012-09-27 DIAGNOSIS — D509 Iron deficiency anemia, unspecified: Secondary | ICD-10-CM | POA: Diagnosis not present

## 2012-09-29 DIAGNOSIS — D509 Iron deficiency anemia, unspecified: Secondary | ICD-10-CM | POA: Diagnosis not present

## 2012-09-29 DIAGNOSIS — N2581 Secondary hyperparathyroidism of renal origin: Secondary | ICD-10-CM | POA: Diagnosis not present

## 2012-09-29 DIAGNOSIS — N186 End stage renal disease: Secondary | ICD-10-CM | POA: Diagnosis not present

## 2012-10-02 DIAGNOSIS — D509 Iron deficiency anemia, unspecified: Secondary | ICD-10-CM | POA: Diagnosis not present

## 2012-10-02 DIAGNOSIS — N186 End stage renal disease: Secondary | ICD-10-CM | POA: Diagnosis not present

## 2012-10-02 DIAGNOSIS — N2581 Secondary hyperparathyroidism of renal origin: Secondary | ICD-10-CM | POA: Diagnosis not present

## 2012-10-04 DIAGNOSIS — N2581 Secondary hyperparathyroidism of renal origin: Secondary | ICD-10-CM | POA: Diagnosis not present

## 2012-10-04 DIAGNOSIS — D509 Iron deficiency anemia, unspecified: Secondary | ICD-10-CM | POA: Diagnosis not present

## 2012-10-04 DIAGNOSIS — N186 End stage renal disease: Secondary | ICD-10-CM | POA: Diagnosis not present

## 2012-10-06 DIAGNOSIS — N186 End stage renal disease: Secondary | ICD-10-CM | POA: Diagnosis not present

## 2012-10-06 DIAGNOSIS — N2581 Secondary hyperparathyroidism of renal origin: Secondary | ICD-10-CM | POA: Diagnosis not present

## 2012-10-06 DIAGNOSIS — D509 Iron deficiency anemia, unspecified: Secondary | ICD-10-CM | POA: Diagnosis not present

## 2012-10-09 DIAGNOSIS — D509 Iron deficiency anemia, unspecified: Secondary | ICD-10-CM | POA: Diagnosis not present

## 2012-10-09 DIAGNOSIS — N186 End stage renal disease: Secondary | ICD-10-CM | POA: Diagnosis not present

## 2012-10-09 DIAGNOSIS — N2581 Secondary hyperparathyroidism of renal origin: Secondary | ICD-10-CM | POA: Diagnosis not present

## 2012-10-11 DIAGNOSIS — N2581 Secondary hyperparathyroidism of renal origin: Secondary | ICD-10-CM | POA: Diagnosis not present

## 2012-10-11 DIAGNOSIS — N186 End stage renal disease: Secondary | ICD-10-CM | POA: Diagnosis not present

## 2012-10-11 DIAGNOSIS — D509 Iron deficiency anemia, unspecified: Secondary | ICD-10-CM | POA: Diagnosis not present

## 2012-10-13 DIAGNOSIS — N2581 Secondary hyperparathyroidism of renal origin: Secondary | ICD-10-CM | POA: Diagnosis not present

## 2012-10-13 DIAGNOSIS — D509 Iron deficiency anemia, unspecified: Secondary | ICD-10-CM | POA: Diagnosis not present

## 2012-10-13 DIAGNOSIS — N186 End stage renal disease: Secondary | ICD-10-CM | POA: Diagnosis not present

## 2012-10-15 DIAGNOSIS — N186 End stage renal disease: Secondary | ICD-10-CM | POA: Diagnosis not present

## 2012-10-16 DIAGNOSIS — D509 Iron deficiency anemia, unspecified: Secondary | ICD-10-CM | POA: Diagnosis not present

## 2012-10-16 DIAGNOSIS — N186 End stage renal disease: Secondary | ICD-10-CM | POA: Diagnosis not present

## 2012-10-18 DIAGNOSIS — D509 Iron deficiency anemia, unspecified: Secondary | ICD-10-CM | POA: Diagnosis not present

## 2012-10-18 DIAGNOSIS — N186 End stage renal disease: Secondary | ICD-10-CM | POA: Diagnosis not present

## 2012-10-19 DIAGNOSIS — I129 Hypertensive chronic kidney disease with stage 1 through stage 4 chronic kidney disease, or unspecified chronic kidney disease: Secondary | ICD-10-CM | POA: Diagnosis not present

## 2012-10-19 DIAGNOSIS — M25539 Pain in unspecified wrist: Secondary | ICD-10-CM | POA: Diagnosis not present

## 2012-10-19 DIAGNOSIS — M25439 Effusion, unspecified wrist: Secondary | ICD-10-CM | POA: Diagnosis not present

## 2012-10-19 DIAGNOSIS — Z992 Dependence on renal dialysis: Secondary | ICD-10-CM | POA: Diagnosis not present

## 2012-10-19 DIAGNOSIS — I509 Heart failure, unspecified: Secondary | ICD-10-CM | POA: Diagnosis not present

## 2012-10-19 DIAGNOSIS — M2550 Pain in unspecified joint: Secondary | ICD-10-CM | POA: Diagnosis not present

## 2012-10-19 DIAGNOSIS — N189 Chronic kidney disease, unspecified: Secondary | ICD-10-CM | POA: Diagnosis not present

## 2012-10-20 DIAGNOSIS — D509 Iron deficiency anemia, unspecified: Secondary | ICD-10-CM | POA: Diagnosis not present

## 2012-10-20 DIAGNOSIS — M79609 Pain in unspecified limb: Secondary | ICD-10-CM | POA: Diagnosis not present

## 2012-10-20 DIAGNOSIS — N186 End stage renal disease: Secondary | ICD-10-CM | POA: Diagnosis not present

## 2012-10-20 DIAGNOSIS — M7989 Other specified soft tissue disorders: Secondary | ICD-10-CM | POA: Diagnosis not present

## 2012-10-23 DIAGNOSIS — N186 End stage renal disease: Secondary | ICD-10-CM | POA: Diagnosis not present

## 2012-10-23 DIAGNOSIS — D509 Iron deficiency anemia, unspecified: Secondary | ICD-10-CM | POA: Diagnosis not present

## 2012-10-25 DIAGNOSIS — N186 End stage renal disease: Secondary | ICD-10-CM | POA: Diagnosis not present

## 2012-10-25 DIAGNOSIS — D509 Iron deficiency anemia, unspecified: Secondary | ICD-10-CM | POA: Diagnosis not present

## 2012-10-27 DIAGNOSIS — N186 End stage renal disease: Secondary | ICD-10-CM | POA: Diagnosis not present

## 2012-10-27 DIAGNOSIS — D509 Iron deficiency anemia, unspecified: Secondary | ICD-10-CM | POA: Diagnosis not present

## 2012-10-30 DIAGNOSIS — D509 Iron deficiency anemia, unspecified: Secondary | ICD-10-CM | POA: Diagnosis not present

## 2012-10-30 DIAGNOSIS — N186 End stage renal disease: Secondary | ICD-10-CM | POA: Diagnosis not present

## 2012-11-01 DIAGNOSIS — D509 Iron deficiency anemia, unspecified: Secondary | ICD-10-CM | POA: Diagnosis not present

## 2012-11-01 DIAGNOSIS — N186 End stage renal disease: Secondary | ICD-10-CM | POA: Diagnosis not present

## 2012-11-03 DIAGNOSIS — N186 End stage renal disease: Secondary | ICD-10-CM | POA: Diagnosis not present

## 2012-11-03 DIAGNOSIS — D509 Iron deficiency anemia, unspecified: Secondary | ICD-10-CM | POA: Diagnosis not present

## 2012-11-06 DIAGNOSIS — D509 Iron deficiency anemia, unspecified: Secondary | ICD-10-CM | POA: Diagnosis not present

## 2012-11-06 DIAGNOSIS — N186 End stage renal disease: Secondary | ICD-10-CM | POA: Diagnosis not present

## 2012-11-08 DIAGNOSIS — N186 End stage renal disease: Secondary | ICD-10-CM | POA: Diagnosis not present

## 2012-11-08 DIAGNOSIS — D509 Iron deficiency anemia, unspecified: Secondary | ICD-10-CM | POA: Diagnosis not present

## 2012-11-10 DIAGNOSIS — D509 Iron deficiency anemia, unspecified: Secondary | ICD-10-CM | POA: Diagnosis not present

## 2012-11-10 DIAGNOSIS — N186 End stage renal disease: Secondary | ICD-10-CM | POA: Diagnosis not present

## 2012-11-13 DIAGNOSIS — N186 End stage renal disease: Secondary | ICD-10-CM | POA: Diagnosis not present

## 2012-11-13 DIAGNOSIS — D509 Iron deficiency anemia, unspecified: Secondary | ICD-10-CM | POA: Diagnosis not present

## 2012-11-15 DIAGNOSIS — N186 End stage renal disease: Secondary | ICD-10-CM | POA: Diagnosis not present

## 2012-11-15 DIAGNOSIS — D509 Iron deficiency anemia, unspecified: Secondary | ICD-10-CM | POA: Diagnosis not present

## 2012-11-17 DIAGNOSIS — D509 Iron deficiency anemia, unspecified: Secondary | ICD-10-CM | POA: Diagnosis not present

## 2012-11-17 DIAGNOSIS — N2581 Secondary hyperparathyroidism of renal origin: Secondary | ICD-10-CM | POA: Diagnosis not present

## 2012-11-17 DIAGNOSIS — N186 End stage renal disease: Secondary | ICD-10-CM | POA: Diagnosis not present

## 2012-11-20 DIAGNOSIS — N186 End stage renal disease: Secondary | ICD-10-CM | POA: Diagnosis not present

## 2012-11-20 DIAGNOSIS — N2581 Secondary hyperparathyroidism of renal origin: Secondary | ICD-10-CM | POA: Diagnosis not present

## 2012-11-20 DIAGNOSIS — D509 Iron deficiency anemia, unspecified: Secondary | ICD-10-CM | POA: Diagnosis not present

## 2012-11-22 DIAGNOSIS — N186 End stage renal disease: Secondary | ICD-10-CM | POA: Diagnosis not present

## 2012-11-22 DIAGNOSIS — N2581 Secondary hyperparathyroidism of renal origin: Secondary | ICD-10-CM | POA: Diagnosis not present

## 2012-11-22 DIAGNOSIS — D509 Iron deficiency anemia, unspecified: Secondary | ICD-10-CM | POA: Diagnosis not present

## 2012-11-24 DIAGNOSIS — D509 Iron deficiency anemia, unspecified: Secondary | ICD-10-CM | POA: Diagnosis not present

## 2012-11-24 DIAGNOSIS — N2581 Secondary hyperparathyroidism of renal origin: Secondary | ICD-10-CM | POA: Diagnosis not present

## 2012-11-24 DIAGNOSIS — N186 End stage renal disease: Secondary | ICD-10-CM | POA: Diagnosis not present

## 2012-11-27 DIAGNOSIS — D509 Iron deficiency anemia, unspecified: Secondary | ICD-10-CM | POA: Diagnosis not present

## 2012-11-27 DIAGNOSIS — N2581 Secondary hyperparathyroidism of renal origin: Secondary | ICD-10-CM | POA: Diagnosis not present

## 2012-11-27 DIAGNOSIS — N186 End stage renal disease: Secondary | ICD-10-CM | POA: Diagnosis not present

## 2012-11-29 DIAGNOSIS — N186 End stage renal disease: Secondary | ICD-10-CM | POA: Diagnosis not present

## 2012-11-29 DIAGNOSIS — D509 Iron deficiency anemia, unspecified: Secondary | ICD-10-CM | POA: Diagnosis not present

## 2012-11-29 DIAGNOSIS — N2581 Secondary hyperparathyroidism of renal origin: Secondary | ICD-10-CM | POA: Diagnosis not present

## 2012-12-01 DIAGNOSIS — N186 End stage renal disease: Secondary | ICD-10-CM | POA: Diagnosis not present

## 2012-12-01 DIAGNOSIS — N2581 Secondary hyperparathyroidism of renal origin: Secondary | ICD-10-CM | POA: Diagnosis not present

## 2012-12-01 DIAGNOSIS — D509 Iron deficiency anemia, unspecified: Secondary | ICD-10-CM | POA: Diagnosis not present

## 2012-12-04 DIAGNOSIS — N2581 Secondary hyperparathyroidism of renal origin: Secondary | ICD-10-CM | POA: Diagnosis not present

## 2012-12-04 DIAGNOSIS — D509 Iron deficiency anemia, unspecified: Secondary | ICD-10-CM | POA: Diagnosis not present

## 2012-12-04 DIAGNOSIS — N186 End stage renal disease: Secondary | ICD-10-CM | POA: Diagnosis not present

## 2012-12-06 DIAGNOSIS — N186 End stage renal disease: Secondary | ICD-10-CM | POA: Diagnosis not present

## 2012-12-07 DIAGNOSIS — N186 End stage renal disease: Secondary | ICD-10-CM | POA: Diagnosis not present

## 2012-12-07 DIAGNOSIS — T82898A Other specified complication of vascular prosthetic devices, implants and grafts, initial encounter: Secondary | ICD-10-CM | POA: Diagnosis not present

## 2012-12-07 DIAGNOSIS — I771 Stricture of artery: Secondary | ICD-10-CM | POA: Diagnosis not present

## 2012-12-07 DIAGNOSIS — I871 Compression of vein: Secondary | ICD-10-CM | POA: Diagnosis not present

## 2012-12-08 DIAGNOSIS — D509 Iron deficiency anemia, unspecified: Secondary | ICD-10-CM | POA: Diagnosis not present

## 2012-12-08 DIAGNOSIS — N2581 Secondary hyperparathyroidism of renal origin: Secondary | ICD-10-CM | POA: Diagnosis not present

## 2012-12-08 DIAGNOSIS — N186 End stage renal disease: Secondary | ICD-10-CM | POA: Diagnosis not present

## 2012-12-11 DIAGNOSIS — N2581 Secondary hyperparathyroidism of renal origin: Secondary | ICD-10-CM | POA: Diagnosis not present

## 2012-12-11 DIAGNOSIS — D509 Iron deficiency anemia, unspecified: Secondary | ICD-10-CM | POA: Diagnosis not present

## 2012-12-11 DIAGNOSIS — N186 End stage renal disease: Secondary | ICD-10-CM | POA: Diagnosis not present

## 2012-12-13 DIAGNOSIS — N186 End stage renal disease: Secondary | ICD-10-CM | POA: Diagnosis not present

## 2012-12-13 DIAGNOSIS — D509 Iron deficiency anemia, unspecified: Secondary | ICD-10-CM | POA: Diagnosis not present

## 2012-12-13 DIAGNOSIS — N2581 Secondary hyperparathyroidism of renal origin: Secondary | ICD-10-CM | POA: Diagnosis not present

## 2012-12-15 DIAGNOSIS — N186 End stage renal disease: Secondary | ICD-10-CM | POA: Diagnosis not present

## 2012-12-15 DIAGNOSIS — D509 Iron deficiency anemia, unspecified: Secondary | ICD-10-CM | POA: Diagnosis not present

## 2012-12-15 DIAGNOSIS — N2581 Secondary hyperparathyroidism of renal origin: Secondary | ICD-10-CM | POA: Diagnosis not present

## 2012-12-16 DIAGNOSIS — N186 End stage renal disease: Secondary | ICD-10-CM | POA: Diagnosis not present

## 2012-12-18 DIAGNOSIS — Z23 Encounter for immunization: Secondary | ICD-10-CM | POA: Diagnosis not present

## 2012-12-18 DIAGNOSIS — D509 Iron deficiency anemia, unspecified: Secondary | ICD-10-CM | POA: Diagnosis not present

## 2012-12-18 DIAGNOSIS — N2581 Secondary hyperparathyroidism of renal origin: Secondary | ICD-10-CM | POA: Diagnosis not present

## 2012-12-18 DIAGNOSIS — N186 End stage renal disease: Secondary | ICD-10-CM | POA: Diagnosis not present

## 2012-12-20 DIAGNOSIS — D509 Iron deficiency anemia, unspecified: Secondary | ICD-10-CM | POA: Diagnosis not present

## 2012-12-20 DIAGNOSIS — Z23 Encounter for immunization: Secondary | ICD-10-CM | POA: Diagnosis not present

## 2012-12-20 DIAGNOSIS — N186 End stage renal disease: Secondary | ICD-10-CM | POA: Diagnosis not present

## 2012-12-20 DIAGNOSIS — N2581 Secondary hyperparathyroidism of renal origin: Secondary | ICD-10-CM | POA: Diagnosis not present

## 2012-12-22 DIAGNOSIS — Z23 Encounter for immunization: Secondary | ICD-10-CM | POA: Diagnosis not present

## 2012-12-22 DIAGNOSIS — N2581 Secondary hyperparathyroidism of renal origin: Secondary | ICD-10-CM | POA: Diagnosis not present

## 2012-12-22 DIAGNOSIS — N186 End stage renal disease: Secondary | ICD-10-CM | POA: Diagnosis not present

## 2012-12-22 DIAGNOSIS — D509 Iron deficiency anemia, unspecified: Secondary | ICD-10-CM | POA: Diagnosis not present

## 2012-12-25 DIAGNOSIS — D509 Iron deficiency anemia, unspecified: Secondary | ICD-10-CM | POA: Diagnosis not present

## 2012-12-25 DIAGNOSIS — N186 End stage renal disease: Secondary | ICD-10-CM | POA: Diagnosis not present

## 2012-12-25 DIAGNOSIS — N2581 Secondary hyperparathyroidism of renal origin: Secondary | ICD-10-CM | POA: Diagnosis not present

## 2012-12-25 DIAGNOSIS — Z23 Encounter for immunization: Secondary | ICD-10-CM | POA: Diagnosis not present

## 2012-12-27 DIAGNOSIS — Z23 Encounter for immunization: Secondary | ICD-10-CM | POA: Diagnosis not present

## 2012-12-27 DIAGNOSIS — N2581 Secondary hyperparathyroidism of renal origin: Secondary | ICD-10-CM | POA: Diagnosis not present

## 2012-12-27 DIAGNOSIS — N186 End stage renal disease: Secondary | ICD-10-CM | POA: Diagnosis not present

## 2012-12-27 DIAGNOSIS — D509 Iron deficiency anemia, unspecified: Secondary | ICD-10-CM | POA: Diagnosis not present

## 2012-12-29 DIAGNOSIS — N186 End stage renal disease: Secondary | ICD-10-CM | POA: Diagnosis not present

## 2012-12-29 DIAGNOSIS — N2581 Secondary hyperparathyroidism of renal origin: Secondary | ICD-10-CM | POA: Diagnosis not present

## 2012-12-29 DIAGNOSIS — Z23 Encounter for immunization: Secondary | ICD-10-CM | POA: Diagnosis not present

## 2012-12-29 DIAGNOSIS — D509 Iron deficiency anemia, unspecified: Secondary | ICD-10-CM | POA: Diagnosis not present

## 2013-01-01 DIAGNOSIS — Z23 Encounter for immunization: Secondary | ICD-10-CM | POA: Diagnosis not present

## 2013-01-01 DIAGNOSIS — N2581 Secondary hyperparathyroidism of renal origin: Secondary | ICD-10-CM | POA: Diagnosis not present

## 2013-01-01 DIAGNOSIS — D509 Iron deficiency anemia, unspecified: Secondary | ICD-10-CM | POA: Diagnosis not present

## 2013-01-01 DIAGNOSIS — N186 End stage renal disease: Secondary | ICD-10-CM | POA: Diagnosis not present

## 2013-01-03 DIAGNOSIS — N186 End stage renal disease: Secondary | ICD-10-CM | POA: Diagnosis not present

## 2013-01-03 DIAGNOSIS — Z23 Encounter for immunization: Secondary | ICD-10-CM | POA: Diagnosis not present

## 2013-01-03 DIAGNOSIS — D509 Iron deficiency anemia, unspecified: Secondary | ICD-10-CM | POA: Diagnosis not present

## 2013-01-03 DIAGNOSIS — N2581 Secondary hyperparathyroidism of renal origin: Secondary | ICD-10-CM | POA: Diagnosis not present

## 2013-01-05 DIAGNOSIS — D509 Iron deficiency anemia, unspecified: Secondary | ICD-10-CM | POA: Diagnosis not present

## 2013-01-05 DIAGNOSIS — N186 End stage renal disease: Secondary | ICD-10-CM | POA: Diagnosis not present

## 2013-01-05 DIAGNOSIS — N2581 Secondary hyperparathyroidism of renal origin: Secondary | ICD-10-CM | POA: Diagnosis not present

## 2013-01-05 DIAGNOSIS — Z23 Encounter for immunization: Secondary | ICD-10-CM | POA: Diagnosis not present

## 2013-01-08 DIAGNOSIS — N186 End stage renal disease: Secondary | ICD-10-CM | POA: Diagnosis not present

## 2013-01-08 DIAGNOSIS — N2581 Secondary hyperparathyroidism of renal origin: Secondary | ICD-10-CM | POA: Diagnosis not present

## 2013-01-08 DIAGNOSIS — Z23 Encounter for immunization: Secondary | ICD-10-CM | POA: Diagnosis not present

## 2013-01-08 DIAGNOSIS — D509 Iron deficiency anemia, unspecified: Secondary | ICD-10-CM | POA: Diagnosis not present

## 2013-01-10 DIAGNOSIS — Z23 Encounter for immunization: Secondary | ICD-10-CM | POA: Diagnosis not present

## 2013-01-10 DIAGNOSIS — D509 Iron deficiency anemia, unspecified: Secondary | ICD-10-CM | POA: Diagnosis not present

## 2013-01-10 DIAGNOSIS — N2581 Secondary hyperparathyroidism of renal origin: Secondary | ICD-10-CM | POA: Diagnosis not present

## 2013-01-10 DIAGNOSIS — N186 End stage renal disease: Secondary | ICD-10-CM | POA: Diagnosis not present

## 2013-01-12 DIAGNOSIS — D509 Iron deficiency anemia, unspecified: Secondary | ICD-10-CM | POA: Diagnosis not present

## 2013-01-12 DIAGNOSIS — N2581 Secondary hyperparathyroidism of renal origin: Secondary | ICD-10-CM | POA: Diagnosis not present

## 2013-01-12 DIAGNOSIS — N186 End stage renal disease: Secondary | ICD-10-CM | POA: Diagnosis not present

## 2013-01-12 DIAGNOSIS — Z23 Encounter for immunization: Secondary | ICD-10-CM | POA: Diagnosis not present

## 2013-01-15 DIAGNOSIS — D509 Iron deficiency anemia, unspecified: Secondary | ICD-10-CM | POA: Diagnosis not present

## 2013-01-15 DIAGNOSIS — N186 End stage renal disease: Secondary | ICD-10-CM | POA: Diagnosis not present

## 2013-01-15 DIAGNOSIS — Z23 Encounter for immunization: Secondary | ICD-10-CM | POA: Diagnosis not present

## 2013-01-15 DIAGNOSIS — N2581 Secondary hyperparathyroidism of renal origin: Secondary | ICD-10-CM | POA: Diagnosis not present

## 2013-01-17 DIAGNOSIS — D509 Iron deficiency anemia, unspecified: Secondary | ICD-10-CM | POA: Diagnosis not present

## 2013-01-17 DIAGNOSIS — Z992 Dependence on renal dialysis: Secondary | ICD-10-CM | POA: Diagnosis not present

## 2013-01-17 DIAGNOSIS — N186 End stage renal disease: Secondary | ICD-10-CM | POA: Diagnosis not present

## 2013-01-19 DIAGNOSIS — Z992 Dependence on renal dialysis: Secondary | ICD-10-CM | POA: Diagnosis not present

## 2013-01-19 DIAGNOSIS — D509 Iron deficiency anemia, unspecified: Secondary | ICD-10-CM | POA: Diagnosis not present

## 2013-01-19 DIAGNOSIS — N186 End stage renal disease: Secondary | ICD-10-CM | POA: Diagnosis not present

## 2013-01-22 DIAGNOSIS — Z992 Dependence on renal dialysis: Secondary | ICD-10-CM | POA: Diagnosis not present

## 2013-01-22 DIAGNOSIS — D509 Iron deficiency anemia, unspecified: Secondary | ICD-10-CM | POA: Diagnosis not present

## 2013-01-22 DIAGNOSIS — N186 End stage renal disease: Secondary | ICD-10-CM | POA: Diagnosis not present

## 2013-01-24 DIAGNOSIS — N186 End stage renal disease: Secondary | ICD-10-CM | POA: Diagnosis not present

## 2013-01-24 DIAGNOSIS — Z992 Dependence on renal dialysis: Secondary | ICD-10-CM | POA: Diagnosis not present

## 2013-01-24 DIAGNOSIS — D509 Iron deficiency anemia, unspecified: Secondary | ICD-10-CM | POA: Diagnosis not present

## 2013-01-26 DIAGNOSIS — Z992 Dependence on renal dialysis: Secondary | ICD-10-CM | POA: Diagnosis not present

## 2013-01-26 DIAGNOSIS — D509 Iron deficiency anemia, unspecified: Secondary | ICD-10-CM | POA: Diagnosis not present

## 2013-01-26 DIAGNOSIS — N186 End stage renal disease: Secondary | ICD-10-CM | POA: Diagnosis not present

## 2013-01-29 DIAGNOSIS — D509 Iron deficiency anemia, unspecified: Secondary | ICD-10-CM | POA: Diagnosis not present

## 2013-01-29 DIAGNOSIS — N186 End stage renal disease: Secondary | ICD-10-CM | POA: Diagnosis not present

## 2013-01-29 DIAGNOSIS — Z992 Dependence on renal dialysis: Secondary | ICD-10-CM | POA: Diagnosis not present

## 2013-01-31 DIAGNOSIS — Z992 Dependence on renal dialysis: Secondary | ICD-10-CM | POA: Diagnosis not present

## 2013-01-31 DIAGNOSIS — D509 Iron deficiency anemia, unspecified: Secondary | ICD-10-CM | POA: Diagnosis not present

## 2013-01-31 DIAGNOSIS — N186 End stage renal disease: Secondary | ICD-10-CM | POA: Diagnosis not present

## 2013-02-02 DIAGNOSIS — D509 Iron deficiency anemia, unspecified: Secondary | ICD-10-CM | POA: Diagnosis not present

## 2013-02-02 DIAGNOSIS — Z992 Dependence on renal dialysis: Secondary | ICD-10-CM | POA: Diagnosis not present

## 2013-02-02 DIAGNOSIS — N186 End stage renal disease: Secondary | ICD-10-CM | POA: Diagnosis not present

## 2013-02-05 DIAGNOSIS — N186 End stage renal disease: Secondary | ICD-10-CM | POA: Diagnosis not present

## 2013-02-05 DIAGNOSIS — Z992 Dependence on renal dialysis: Secondary | ICD-10-CM | POA: Diagnosis not present

## 2013-02-05 DIAGNOSIS — D509 Iron deficiency anemia, unspecified: Secondary | ICD-10-CM | POA: Diagnosis not present

## 2013-02-07 DIAGNOSIS — N186 End stage renal disease: Secondary | ICD-10-CM | POA: Diagnosis not present

## 2013-02-07 DIAGNOSIS — Z992 Dependence on renal dialysis: Secondary | ICD-10-CM | POA: Diagnosis not present

## 2013-02-07 DIAGNOSIS — D509 Iron deficiency anemia, unspecified: Secondary | ICD-10-CM | POA: Diagnosis not present

## 2013-02-09 DIAGNOSIS — N186 End stage renal disease: Secondary | ICD-10-CM | POA: Diagnosis not present

## 2013-02-09 DIAGNOSIS — D509 Iron deficiency anemia, unspecified: Secondary | ICD-10-CM | POA: Diagnosis not present

## 2013-02-09 DIAGNOSIS — Z992 Dependence on renal dialysis: Secondary | ICD-10-CM | POA: Diagnosis not present

## 2013-02-12 DIAGNOSIS — Z992 Dependence on renal dialysis: Secondary | ICD-10-CM | POA: Diagnosis not present

## 2013-02-12 DIAGNOSIS — N186 End stage renal disease: Secondary | ICD-10-CM | POA: Diagnosis not present

## 2013-02-12 DIAGNOSIS — D509 Iron deficiency anemia, unspecified: Secondary | ICD-10-CM | POA: Diagnosis not present

## 2013-02-14 DIAGNOSIS — D509 Iron deficiency anemia, unspecified: Secondary | ICD-10-CM | POA: Diagnosis not present

## 2013-02-14 DIAGNOSIS — N186 End stage renal disease: Secondary | ICD-10-CM | POA: Diagnosis not present

## 2013-02-14 DIAGNOSIS — Z992 Dependence on renal dialysis: Secondary | ICD-10-CM | POA: Diagnosis not present

## 2013-02-15 DIAGNOSIS — N186 End stage renal disease: Secondary | ICD-10-CM | POA: Diagnosis not present

## 2013-02-16 DIAGNOSIS — Z992 Dependence on renal dialysis: Secondary | ICD-10-CM | POA: Diagnosis not present

## 2013-02-16 DIAGNOSIS — N186 End stage renal disease: Secondary | ICD-10-CM | POA: Diagnosis not present

## 2013-02-16 DIAGNOSIS — Z23 Encounter for immunization: Secondary | ICD-10-CM | POA: Diagnosis not present

## 2013-02-16 DIAGNOSIS — D509 Iron deficiency anemia, unspecified: Secondary | ICD-10-CM | POA: Diagnosis not present

## 2013-02-16 DIAGNOSIS — N2581 Secondary hyperparathyroidism of renal origin: Secondary | ICD-10-CM | POA: Diagnosis not present

## 2013-02-18 DIAGNOSIS — I871 Compression of vein: Secondary | ICD-10-CM | POA: Diagnosis not present

## 2013-02-18 DIAGNOSIS — N186 End stage renal disease: Secondary | ICD-10-CM | POA: Diagnosis not present

## 2013-02-18 DIAGNOSIS — T82898A Other specified complication of vascular prosthetic devices, implants and grafts, initial encounter: Secondary | ICD-10-CM | POA: Diagnosis not present

## 2013-02-21 DIAGNOSIS — N186 End stage renal disease: Secondary | ICD-10-CM | POA: Diagnosis not present

## 2013-03-17 DIAGNOSIS — N186 End stage renal disease: Secondary | ICD-10-CM | POA: Diagnosis not present

## 2013-03-19 DIAGNOSIS — N186 End stage renal disease: Secondary | ICD-10-CM | POA: Diagnosis not present

## 2013-03-19 DIAGNOSIS — D509 Iron deficiency anemia, unspecified: Secondary | ICD-10-CM | POA: Diagnosis not present

## 2013-03-19 DIAGNOSIS — Z992 Dependence on renal dialysis: Secondary | ICD-10-CM | POA: Diagnosis not present

## 2013-03-21 DIAGNOSIS — N186 End stage renal disease: Secondary | ICD-10-CM | POA: Diagnosis not present

## 2013-04-17 DIAGNOSIS — N186 End stage renal disease: Secondary | ICD-10-CM | POA: Diagnosis not present

## 2013-04-18 DIAGNOSIS — N186 End stage renal disease: Secondary | ICD-10-CM | POA: Diagnosis not present

## 2013-04-18 DIAGNOSIS — Z992 Dependence on renal dialysis: Secondary | ICD-10-CM | POA: Diagnosis not present

## 2013-04-18 DIAGNOSIS — D509 Iron deficiency anemia, unspecified: Secondary | ICD-10-CM | POA: Diagnosis not present

## 2013-04-25 DIAGNOSIS — N186 End stage renal disease: Secondary | ICD-10-CM | POA: Diagnosis not present

## 2013-05-09 DIAGNOSIS — N186 End stage renal disease: Secondary | ICD-10-CM | POA: Diagnosis not present

## 2013-05-09 DIAGNOSIS — T82898A Other specified complication of vascular prosthetic devices, implants and grafts, initial encounter: Secondary | ICD-10-CM | POA: Diagnosis not present

## 2013-05-09 DIAGNOSIS — I871 Compression of vein: Secondary | ICD-10-CM | POA: Diagnosis not present

## 2013-05-10 DIAGNOSIS — E669 Obesity, unspecified: Secondary | ICD-10-CM | POA: Diagnosis not present

## 2013-05-10 DIAGNOSIS — I509 Heart failure, unspecified: Secondary | ICD-10-CM | POA: Diagnosis not present

## 2013-05-10 DIAGNOSIS — T82898A Other specified complication of vascular prosthetic devices, implants and grafts, initial encounter: Secondary | ICD-10-CM | POA: Diagnosis not present

## 2013-05-10 DIAGNOSIS — N186 End stage renal disease: Secondary | ICD-10-CM | POA: Diagnosis not present

## 2013-05-13 ENCOUNTER — Ambulatory Visit: Payer: Self-pay | Admitting: Vascular Surgery

## 2013-05-15 ENCOUNTER — Inpatient Hospital Stay: Payer: Self-pay | Admitting: Internal Medicine

## 2013-05-15 DIAGNOSIS — I10 Essential (primary) hypertension: Secondary | ICD-10-CM | POA: Diagnosis not present

## 2013-05-15 DIAGNOSIS — E669 Obesity, unspecified: Secondary | ICD-10-CM | POA: Diagnosis not present

## 2013-05-15 DIAGNOSIS — I428 Other cardiomyopathies: Secondary | ICD-10-CM | POA: Diagnosis present

## 2013-05-15 DIAGNOSIS — I509 Heart failure, unspecified: Secondary | ICD-10-CM | POA: Diagnosis present

## 2013-05-15 DIAGNOSIS — E875 Hyperkalemia: Secondary | ICD-10-CM | POA: Diagnosis not present

## 2013-05-15 DIAGNOSIS — Z7982 Long term (current) use of aspirin: Secondary | ICD-10-CM | POA: Diagnosis not present

## 2013-05-15 DIAGNOSIS — I8390 Asymptomatic varicose veins of unspecified lower extremity: Secondary | ICD-10-CM | POA: Diagnosis present

## 2013-05-15 DIAGNOSIS — N186 End stage renal disease: Secondary | ICD-10-CM | POA: Diagnosis not present

## 2013-05-15 DIAGNOSIS — T82898A Other specified complication of vascular prosthetic devices, implants and grafts, initial encounter: Secondary | ICD-10-CM | POA: Diagnosis not present

## 2013-05-15 DIAGNOSIS — N2581 Secondary hyperparathyroidism of renal origin: Secondary | ICD-10-CM | POA: Diagnosis present

## 2013-05-15 DIAGNOSIS — Z5309 Procedure and treatment not carried out because of other contraindication: Secondary | ICD-10-CM | POA: Diagnosis not present

## 2013-05-15 DIAGNOSIS — I120 Hypertensive chronic kidney disease with stage 5 chronic kidney disease or end stage renal disease: Secondary | ICD-10-CM | POA: Diagnosis present

## 2013-05-15 DIAGNOSIS — T82598A Other mechanical complication of other cardiac and vascular devices and implants, initial encounter: Secondary | ICD-10-CM | POA: Diagnosis present

## 2013-05-15 DIAGNOSIS — Z7902 Long term (current) use of antithrombotics/antiplatelets: Secondary | ICD-10-CM | POA: Diagnosis not present

## 2013-05-15 LAB — PHOSPHORUS: PHOSPHORUS: 8.7 mg/dL — AB (ref 2.5–4.9)

## 2013-05-15 LAB — POTASSIUM: POTASSIUM: 4.3 mmol/L (ref 3.5–5.1)

## 2013-05-15 LAB — HEMOGLOBIN: HGB: 12 g/dL — ABNORMAL LOW (ref 13.0–18.0)

## 2013-05-16 DIAGNOSIS — I10 Essential (primary) hypertension: Secondary | ICD-10-CM | POA: Diagnosis not present

## 2013-05-16 DIAGNOSIS — E669 Obesity, unspecified: Secondary | ICD-10-CM | POA: Diagnosis not present

## 2013-05-16 DIAGNOSIS — E875 Hyperkalemia: Secondary | ICD-10-CM | POA: Diagnosis not present

## 2013-05-16 DIAGNOSIS — N186 End stage renal disease: Secondary | ICD-10-CM | POA: Diagnosis not present

## 2013-05-16 LAB — COMPREHENSIVE METABOLIC PANEL
Albumin: 2.5 g/dL — ABNORMAL LOW (ref 3.4–5.0)
Alkaline Phosphatase: 145 U/L — ABNORMAL HIGH
Anion Gap: 9 (ref 7–16)
BILIRUBIN TOTAL: 0.6 mg/dL (ref 0.2–1.0)
BUN: 51 mg/dL — AB (ref 7–18)
CALCIUM: 6.5 mg/dL — AB (ref 8.5–10.1)
CO2: 26 mmol/L (ref 21–32)
Chloride: 99 mmol/L (ref 98–107)
Creatinine: 15.91 mg/dL — ABNORMAL HIGH (ref 0.60–1.30)
EGFR (African American): 4 — ABNORMAL LOW
GFR CALC NON AF AMER: 3 — AB
Glucose: 66 mg/dL (ref 65–99)
OSMOLALITY: 280 (ref 275–301)
Potassium: 5.8 mmol/L — ABNORMAL HIGH (ref 3.5–5.1)
SGOT(AST): 19 U/L (ref 15–37)
SGPT (ALT): <6 U/L — ABNORMAL LOW (ref 12–78)
Sodium: 134 mmol/L — ABNORMAL LOW (ref 136–145)
Total Protein: 7.2 g/dL (ref 6.4–8.2)

## 2013-05-16 LAB — TSH: Thyroid Stimulating Horm: 1.66 u[IU]/mL

## 2013-05-16 LAB — LIPID PANEL
Cholesterol: 169 mg/dL (ref 0–200)
HDL: 35 mg/dL — AB (ref 40–60)
LDL CHOLESTEROL, CALC: 115 mg/dL — AB (ref 0–100)
Triglycerides: 97 mg/dL (ref 0–200)
VLDL CHOLESTEROL, CALC: 19 mg/dL (ref 5–40)

## 2013-05-16 LAB — CBC WITH DIFFERENTIAL/PLATELET
BASOS PCT: 0.6 %
Basophil #: 0 10*3/uL (ref 0.0–0.1)
EOS ABS: 0.7 10*3/uL (ref 0.0–0.7)
Eosinophil %: 10.2 %
HCT: 36.9 % — ABNORMAL LOW (ref 40.0–52.0)
HGB: 12.1 g/dL — ABNORMAL LOW (ref 13.0–18.0)
LYMPHS ABS: 0.7 10*3/uL — AB (ref 1.0–3.6)
Lymphocyte %: 9.5 %
MCH: 24.4 pg — ABNORMAL LOW (ref 26.0–34.0)
MCHC: 32.8 g/dL (ref 32.0–36.0)
MCV: 74 fL — ABNORMAL LOW (ref 80–100)
MONOS PCT: 6.2 %
Monocyte #: 0.5 x10 3/mm (ref 0.2–1.0)
NEUTROS PCT: 73.5 %
Neutrophil #: 5.4 10*3/uL (ref 1.4–6.5)
Platelet: 201 10*3/uL (ref 150–440)
RBC: 4.96 10*6/uL (ref 4.40–5.90)
RDW: 17.7 % — ABNORMAL HIGH (ref 11.5–14.5)
WBC: 7.3 10*3/uL (ref 3.8–10.6)

## 2013-05-18 DIAGNOSIS — N186 End stage renal disease: Secondary | ICD-10-CM | POA: Diagnosis not present

## 2013-05-21 DIAGNOSIS — N186 End stage renal disease: Secondary | ICD-10-CM | POA: Diagnosis not present

## 2013-05-21 DIAGNOSIS — N2581 Secondary hyperparathyroidism of renal origin: Secondary | ICD-10-CM | POA: Diagnosis not present

## 2013-05-21 DIAGNOSIS — D509 Iron deficiency anemia, unspecified: Secondary | ICD-10-CM | POA: Diagnosis not present

## 2013-05-23 DIAGNOSIS — N186 End stage renal disease: Secondary | ICD-10-CM | POA: Diagnosis not present

## 2013-05-27 ENCOUNTER — Ambulatory Visit: Payer: Self-pay | Admitting: Vascular Surgery

## 2013-05-27 DIAGNOSIS — Z01812 Encounter for preprocedural laboratory examination: Secondary | ICD-10-CM | POA: Diagnosis not present

## 2013-05-27 DIAGNOSIS — R0989 Other specified symptoms and signs involving the circulatory and respiratory systems: Secondary | ICD-10-CM | POA: Diagnosis not present

## 2013-05-27 DIAGNOSIS — R0609 Other forms of dyspnea: Secondary | ICD-10-CM | POA: Diagnosis not present

## 2013-05-27 DIAGNOSIS — Z01810 Encounter for preprocedural cardiovascular examination: Secondary | ICD-10-CM | POA: Diagnosis not present

## 2013-05-27 DIAGNOSIS — Z01818 Encounter for other preprocedural examination: Secondary | ICD-10-CM | POA: Diagnosis not present

## 2013-05-27 LAB — CBC
HCT: 39.5 % — ABNORMAL LOW (ref 40.0–52.0)
HGB: 12.2 g/dL — ABNORMAL LOW (ref 13.0–18.0)
MCH: 23.6 pg — AB (ref 26.0–34.0)
MCHC: 31 g/dL — AB (ref 32.0–36.0)
MCV: 76 fL — ABNORMAL LOW (ref 80–100)
Platelet: 278 10*3/uL (ref 150–440)
RBC: 5.19 10*6/uL (ref 4.40–5.90)
RDW: 18.1 % — AB (ref 11.5–14.5)
WBC: 9.1 10*3/uL (ref 3.8–10.6)

## 2013-05-27 LAB — BASIC METABOLIC PANEL
ANION GAP: 5 — AB (ref 7–16)
BUN: 45 mg/dL — AB (ref 7–18)
CALCIUM: 8.4 mg/dL — AB (ref 8.5–10.1)
Chloride: 102 mmol/L (ref 98–107)
Co2: 31 mmol/L (ref 21–32)
Creatinine: 12.26 mg/dL — ABNORMAL HIGH (ref 0.60–1.30)
EGFR (Non-African Amer.): 4 — ABNORMAL LOW
GFR CALC AF AMER: 5 — AB
Glucose: 76 mg/dL (ref 65–99)
OSMOLALITY: 286 (ref 275–301)
POTASSIUM: 4.7 mmol/L (ref 3.5–5.1)
Sodium: 138 mmol/L (ref 136–145)

## 2013-05-30 ENCOUNTER — Encounter: Payer: Self-pay | Admitting: Physician Assistant

## 2013-05-30 ENCOUNTER — Ambulatory Visit (INDEPENDENT_AMBULATORY_CARE_PROVIDER_SITE_OTHER): Payer: Medicare Other | Admitting: Physician Assistant

## 2013-05-30 VITALS — BP 110/84 | HR 105 | Ht 72.0 in | Wt 340.0 lb

## 2013-05-30 DIAGNOSIS — N186 End stage renal disease: Secondary | ICD-10-CM

## 2013-05-30 DIAGNOSIS — Z01810 Encounter for preprocedural cardiovascular examination: Secondary | ICD-10-CM | POA: Diagnosis not present

## 2013-05-30 DIAGNOSIS — I498 Other specified cardiac arrhythmias: Secondary | ICD-10-CM | POA: Diagnosis not present

## 2013-05-30 DIAGNOSIS — I10 Essential (primary) hypertension: Secondary | ICD-10-CM | POA: Diagnosis not present

## 2013-05-30 DIAGNOSIS — R000 Tachycardia, unspecified: Secondary | ICD-10-CM

## 2013-05-30 MED ORDER — NITROGLYCERIN 0.4 MG SL SUBL: 0.4000 mg | SUBLINGUAL_TABLET | SUBLINGUAL | Status: DC | PRN

## 2013-05-30 NOTE — Progress Notes (Signed)
[place], [place], [place]  [address] Phone: [phone] Fax:  [phone]  Date:  05/30/2013   ID:  [name] [name], DOB Dec 19, 1965, MRN [id number]  PCP:  Redge Gainer, MD  Cardiologist:  Dr. Cleatis Polka     History of Present Illness: [name] [name] is a 48 y.o. male with a hx of mild persistent sinus tachycardia, HTN, ESRD on hemodialysis.  Echo 10/2010: EF 55-60%, impaired LV relaxation, mild LAE, mild TR.  TSH has been normal in the past.   He needs another revision of his R arm graft for dialysis.  ECG demonstrated "septal infarct" and he was referred back for surgical clearance. He will need gen anesthesia.  The patient denies chest pain, shortness of breath, syncope, orthopnea, PND or significant pedal edema.  He can achieve > 4 METs.    Recent Labs: 08/08/2012: Potassium 5.0   Wt Readings from Last 3 Encounters:  05/30/13 340 lb (154.223 kg)  12/22/11 339 lb 9.6 oz (154.042 kg)  09/06/11 346 lb (156.945 kg)     Past Medical History  Diagnosis Date  . Hypertension   . Orthostatic hypotension      2008  . Chest discomfort      2008  . Sinus tachycardia      2008, TSH normal  . Ejection fraction      65%, echo, 2008   /   EF 55-60%, echo, October 28, 2010  . Overweight   . Syncope      positional after dialysis... 2008  . Aneurysm      Right arm fistula 3 aneurysms 2011,   plans to have a new procedure  . CHF (congestive heart failure)   . Leg pain   . Angina     occasional, last 6 mo ago  . Dialysis patient     M-W-F @ [facility]  . ESRD (end stage renal disease)     on hemodialysis M-W-F [facility]    Current Outpatient Prescriptions  Medication Sig Dispense Refill  . aspirin 81 MG tablet Take 81 mg by mouth daily.        . B Complex-C-Folic Acid (NEPHRO-VITE PO) Take 1 tablet by mouth daily.        . calcium acetate (PHOSLO) 667 MG capsule Take 1,334 mg by mouth 3 (three) times daily with meals. And 1 with snacks       . cinacalcet (SENSIPAR) 90 MG  tablet Take 90 mg by mouth daily.      . clopidogrel (PLAVIX) 75 MG tablet Take by mouth daily. To start on Plavix daily, does not know dose as prescription is in car. Had a 4 tablet load this morning      . FOSRENOL 500 MG chewable tablet Chew 500 mg by mouth 3 (three) times daily with meals.       . nitroGLYCERIN (NITROSTAT) 0.4 MG SL tablet Place 0.4 mg under the tongue every 5 (five) minutes as needed. For chest      . sevelamer (RENVELA) 800 MG tablet Take 800-1,600 mg by mouth 3 (three) times daily with meals. Take 2 tablets with meals and 1 tablet with snacks.       No current facility-administered medications for this visit.    Allergies:   Review of patient's allergies indicates no known allergies.   Social History:  The patient  reports that he quit smoking about 19 years ago. His smoking use included Cigarettes. He smoked 0.00 packs per  day. His smokeless tobacco use includes Snuff. He reports that he does not drink alcohol or use illicit drugs.   Family History:  The patient's family history includes Heart disease in his father and mother; Hypertension in his father and mother.   ROS:  Please see the history of present illness.      All other systems reviewed and negative.   PHYSICAL EXAM: VS:  BP 110/84  Pulse 105  Ht 6' (1.829 m)  Wt 340 lb (154.223 kg)  BMI 46.10 kg/m2 Well nourished, well developed, in no acute distress HEENT: normal Neck: no JVD Cardiac:  normal S1, S2; RRR; no murmur Lungs:  clear to auscultation bilaterally, no wheezing, rhonchi or rales Abd: soft, nontender, no hepatomegaly Ext: no edema Skin: warm and dry Neuro:  CNs 2-12 intact, no focal abnormalities noted  EKG:  Sinus tachy, HR 105, no acute changes     ASSESSMENT AND PLAN:  1. Surgical Clearance:  He has no unstable cardiac conditions.  He can achieve > 4 METs without symptoms of angina.  He is in NSR.  His ECG does not look significantly different when compared to old tracings.  I  reviewed this with Dr. Jenkins Rouge (DOD) as well.  He agrees.  The patient may proceed with his non-cardiac surgery at acceptable risk.   2. ESRD:  He remains on dialysis. 3. Sinus Tachycardia:  This has been persistent over the years.  This problem is stable.  4. Hypertension:  Controlled.  5. Disposition:  F/u with Dr. Cleatis Polka in 1 year.   Signed, Richardson Dopp, PA-C  05/30/2013 2:54 PM

## 2013-05-30 NOTE — Patient Instructions (Signed)
I sent a refill of your Nitroglycerin into the pharmacy as requested.

## 2013-05-31 ENCOUNTER — Telehealth: Payer: Self-pay | Admitting: Cardiology

## 2013-05-31 NOTE — Telephone Encounter (Signed)
The pts last OV notes which includes cardiac clearance has been faxed to [facility] at [phone].

## 2013-05-31 NOTE — Telephone Encounter (Signed)
New Problem:  Rosann Auerbach from [facility] is calling to get surgical clearance on the pt. Rosann Auerbach states the pt is scheduled for surgery next week. Below is Marsha's fax number.  [phone]

## 2013-06-05 ENCOUNTER — Ambulatory Visit: Payer: Self-pay | Admitting: Vascular Surgery

## 2013-06-05 DIAGNOSIS — Z7982 Long term (current) use of aspirin: Secondary | ICD-10-CM | POA: Diagnosis not present

## 2013-06-05 DIAGNOSIS — N19 Unspecified kidney failure: Secondary | ICD-10-CM | POA: Diagnosis not present

## 2013-06-05 DIAGNOSIS — Z87891 Personal history of nicotine dependence: Secondary | ICD-10-CM | POA: Diagnosis not present

## 2013-06-05 DIAGNOSIS — M5030 Other cervical disc degeneration, unspecified cervical region: Secondary | ICD-10-CM | POA: Diagnosis not present

## 2013-06-05 DIAGNOSIS — Z992 Dependence on renal dialysis: Secondary | ICD-10-CM | POA: Diagnosis not present

## 2013-06-05 DIAGNOSIS — Z8249 Family history of ischemic heart disease and other diseases of the circulatory system: Secondary | ICD-10-CM | POA: Diagnosis not present

## 2013-06-05 DIAGNOSIS — I509 Heart failure, unspecified: Secondary | ICD-10-CM | POA: Diagnosis not present

## 2013-06-05 DIAGNOSIS — Z833 Family history of diabetes mellitus: Secondary | ICD-10-CM | POA: Diagnosis not present

## 2013-06-05 DIAGNOSIS — Z79899 Other long term (current) drug therapy: Secondary | ICD-10-CM | POA: Diagnosis not present

## 2013-06-05 DIAGNOSIS — N186 End stage renal disease: Secondary | ICD-10-CM | POA: Diagnosis not present

## 2013-06-05 DIAGNOSIS — M5137 Other intervertebral disc degeneration, lumbosacral region: Secondary | ICD-10-CM | POA: Diagnosis not present

## 2013-06-05 DIAGNOSIS — Z8489 Family history of other specified conditions: Secondary | ICD-10-CM | POA: Diagnosis not present

## 2013-06-05 DIAGNOSIS — I120 Hypertensive chronic kidney disease with stage 5 chronic kidney disease or end stage renal disease: Secondary | ICD-10-CM | POA: Diagnosis not present

## 2013-06-07 ENCOUNTER — Telehealth: Payer: Self-pay | Admitting: Cardiology

## 2013-06-07 NOTE — Telephone Encounter (Signed)
Received request from Nurse fax box, documents faxed for surgical clearance. To: [facility]  Fax number: [phone] Attention: 2.20.15/kdm

## 2013-06-15 DIAGNOSIS — N186 End stage renal disease: Secondary | ICD-10-CM | POA: Diagnosis not present

## 2013-06-18 DIAGNOSIS — D631 Anemia in chronic kidney disease: Secondary | ICD-10-CM | POA: Diagnosis not present

## 2013-06-18 DIAGNOSIS — N186 End stage renal disease: Secondary | ICD-10-CM | POA: Diagnosis not present

## 2013-06-18 DIAGNOSIS — Z992 Dependence on renal dialysis: Secondary | ICD-10-CM | POA: Diagnosis not present

## 2013-06-18 DIAGNOSIS — D509 Iron deficiency anemia, unspecified: Secondary | ICD-10-CM | POA: Diagnosis not present

## 2013-07-03 DIAGNOSIS — N186 End stage renal disease: Secondary | ICD-10-CM | POA: Diagnosis not present

## 2013-07-16 DIAGNOSIS — N186 End stage renal disease: Secondary | ICD-10-CM | POA: Diagnosis not present

## 2013-07-18 DIAGNOSIS — D509 Iron deficiency anemia, unspecified: Secondary | ICD-10-CM | POA: Diagnosis not present

## 2013-07-18 DIAGNOSIS — N186 End stage renal disease: Secondary | ICD-10-CM | POA: Diagnosis not present

## 2013-07-25 DIAGNOSIS — N186 End stage renal disease: Secondary | ICD-10-CM | POA: Diagnosis not present

## 2013-08-15 DIAGNOSIS — N186 End stage renal disease: Secondary | ICD-10-CM | POA: Diagnosis not present

## 2013-08-17 DIAGNOSIS — N2581 Secondary hyperparathyroidism of renal origin: Secondary | ICD-10-CM | POA: Diagnosis not present

## 2013-08-17 DIAGNOSIS — D509 Iron deficiency anemia, unspecified: Secondary | ICD-10-CM | POA: Diagnosis not present

## 2013-08-17 DIAGNOSIS — N186 End stage renal disease: Secondary | ICD-10-CM | POA: Diagnosis not present

## 2013-08-22 DIAGNOSIS — N186 End stage renal disease: Secondary | ICD-10-CM | POA: Diagnosis not present

## 2013-09-15 DIAGNOSIS — N186 End stage renal disease: Secondary | ICD-10-CM | POA: Diagnosis not present

## 2013-09-17 DIAGNOSIS — N2581 Secondary hyperparathyroidism of renal origin: Secondary | ICD-10-CM | POA: Diagnosis not present

## 2013-09-17 DIAGNOSIS — D509 Iron deficiency anemia, unspecified: Secondary | ICD-10-CM | POA: Diagnosis not present

## 2013-09-17 DIAGNOSIS — N186 End stage renal disease: Secondary | ICD-10-CM | POA: Diagnosis not present

## 2013-09-19 DIAGNOSIS — N186 End stage renal disease: Secondary | ICD-10-CM | POA: Diagnosis not present

## 2013-10-07 DIAGNOSIS — N186 End stage renal disease: Secondary | ICD-10-CM | POA: Diagnosis not present

## 2013-10-07 DIAGNOSIS — I509 Heart failure, unspecified: Secondary | ICD-10-CM | POA: Diagnosis not present

## 2013-10-07 DIAGNOSIS — T82898A Other specified complication of vascular prosthetic devices, implants and grafts, initial encounter: Secondary | ICD-10-CM | POA: Diagnosis not present

## 2013-10-07 DIAGNOSIS — E669 Obesity, unspecified: Secondary | ICD-10-CM | POA: Diagnosis not present

## 2013-10-15 DIAGNOSIS — N186 End stage renal disease: Secondary | ICD-10-CM | POA: Diagnosis not present

## 2013-10-17 DIAGNOSIS — D509 Iron deficiency anemia, unspecified: Secondary | ICD-10-CM | POA: Diagnosis not present

## 2013-10-17 DIAGNOSIS — N186 End stage renal disease: Secondary | ICD-10-CM | POA: Diagnosis not present

## 2013-10-24 DIAGNOSIS — N186 End stage renal disease: Secondary | ICD-10-CM | POA: Diagnosis not present

## 2013-11-05 ENCOUNTER — Ambulatory Visit: Payer: Self-pay | Admitting: Vascular Surgery

## 2013-11-05 DIAGNOSIS — E669 Obesity, unspecified: Secondary | ICD-10-CM | POA: Diagnosis not present

## 2013-11-05 DIAGNOSIS — N186 End stage renal disease: Secondary | ICD-10-CM | POA: Diagnosis not present

## 2013-11-05 DIAGNOSIS — T82898A Other specified complication of vascular prosthetic devices, implants and grafts, initial encounter: Secondary | ICD-10-CM | POA: Diagnosis not present

## 2013-11-05 DIAGNOSIS — Z79899 Other long term (current) drug therapy: Secondary | ICD-10-CM | POA: Diagnosis not present

## 2013-11-05 DIAGNOSIS — Z992 Dependence on renal dialysis: Secondary | ICD-10-CM | POA: Diagnosis not present

## 2013-11-05 DIAGNOSIS — I509 Heart failure, unspecified: Secondary | ICD-10-CM | POA: Diagnosis not present

## 2013-11-05 DIAGNOSIS — Z7982 Long term (current) use of aspirin: Secondary | ICD-10-CM | POA: Diagnosis not present

## 2013-11-11 ENCOUNTER — Emergency Department: Payer: Self-pay | Admitting: Emergency Medicine

## 2013-11-11 DIAGNOSIS — T82898A Other specified complication of vascular prosthetic devices, implants and grafts, initial encounter: Secondary | ICD-10-CM | POA: Diagnosis not present

## 2013-11-11 LAB — BASIC METABOLIC PANEL
Anion Gap: 15 (ref 7–16)
BUN: 88 mg/dL — AB (ref 7–18)
CREATININE: 19.37 mg/dL — AB (ref 0.60–1.30)
Calcium, Total: 6.5 mg/dL — CL (ref 8.5–10.1)
Chloride: 97 mmol/L — ABNORMAL LOW (ref 98–107)
Co2: 24 mmol/L (ref 21–32)
EGFR (African American): 3 — ABNORMAL LOW
EGFR (Non-African Amer.): 2 — ABNORMAL LOW
Glucose: 83 mg/dL (ref 65–99)
OSMOLALITY: 298 (ref 275–301)
Potassium: 4.5 mmol/L (ref 3.5–5.1)
Sodium: 136 mmol/L (ref 136–145)

## 2013-11-12 ENCOUNTER — Inpatient Hospital Stay: Payer: Self-pay | Admitting: Internal Medicine

## 2013-11-12 DIAGNOSIS — Z6841 Body Mass Index (BMI) 40.0 and over, adult: Secondary | ICD-10-CM | POA: Diagnosis not present

## 2013-11-12 DIAGNOSIS — I10 Essential (primary) hypertension: Secondary | ICD-10-CM | POA: Diagnosis not present

## 2013-11-12 DIAGNOSIS — I871 Compression of vein: Secondary | ICD-10-CM | POA: Diagnosis present

## 2013-11-12 DIAGNOSIS — E669 Obesity, unspecified: Secondary | ICD-10-CM | POA: Diagnosis not present

## 2013-11-12 DIAGNOSIS — I509 Heart failure, unspecified: Secondary | ICD-10-CM | POA: Diagnosis not present

## 2013-11-12 DIAGNOSIS — I771 Stricture of artery: Secondary | ICD-10-CM | POA: Diagnosis not present

## 2013-11-12 DIAGNOSIS — N2581 Secondary hyperparathyroidism of renal origin: Secondary | ICD-10-CM | POA: Diagnosis not present

## 2013-11-12 DIAGNOSIS — I120 Hypertensive chronic kidney disease with stage 5 chronic kidney disease or end stage renal disease: Secondary | ICD-10-CM | POA: Diagnosis present

## 2013-11-12 DIAGNOSIS — T82898A Other specified complication of vascular prosthetic devices, implants and grafts, initial encounter: Secondary | ICD-10-CM | POA: Diagnosis not present

## 2013-11-12 DIAGNOSIS — T82598A Other mechanical complication of other cardiac and vascular devices and implants, initial encounter: Secondary | ICD-10-CM | POA: Diagnosis not present

## 2013-11-12 DIAGNOSIS — Z7982 Long term (current) use of aspirin: Secondary | ICD-10-CM | POA: Diagnosis not present

## 2013-11-12 DIAGNOSIS — D631 Anemia in chronic kidney disease: Secondary | ICD-10-CM | POA: Diagnosis present

## 2013-11-12 DIAGNOSIS — N186 End stage renal disease: Secondary | ICD-10-CM | POA: Diagnosis not present

## 2013-11-12 DIAGNOSIS — E875 Hyperkalemia: Secondary | ICD-10-CM | POA: Diagnosis not present

## 2013-11-12 DIAGNOSIS — Z992 Dependence on renal dialysis: Secondary | ICD-10-CM | POA: Diagnosis not present

## 2013-11-12 LAB — CBC WITH DIFFERENTIAL/PLATELET
Basophil #: 0.1 10*3/uL (ref 0.0–0.1)
Basophil %: 0.7 %
EOS ABS: 1 10*3/uL — AB (ref 0.0–0.7)
EOS PCT: 9.4 %
HCT: 36.8 % — ABNORMAL LOW (ref 40.0–52.0)
HGB: 11.7 g/dL — ABNORMAL LOW (ref 13.0–18.0)
LYMPHS ABS: 1.2 10*3/uL (ref 1.0–3.6)
LYMPHS PCT: 11.9 %
MCH: 24.4 pg — ABNORMAL LOW (ref 26.0–34.0)
MCHC: 31.8 g/dL — AB (ref 32.0–36.0)
MCV: 77 fL — AB (ref 80–100)
MONO ABS: 0.5 x10 3/mm (ref 0.2–1.0)
MONOS PCT: 5.2 %
NEUTROS ABS: 7.5 10*3/uL — AB (ref 1.4–6.5)
Neutrophil %: 72.8 %
Platelet: 238 10*3/uL (ref 150–440)
RBC: 4.8 10*6/uL (ref 4.40–5.90)
RDW: 17.5 % — ABNORMAL HIGH (ref 11.5–14.5)
WBC: 10.4 10*3/uL (ref 3.8–10.6)

## 2013-11-12 LAB — BASIC METABOLIC PANEL
Anion Gap: 17 — ABNORMAL HIGH (ref 7–16)
BUN: 106 mg/dL — AB (ref 7–18)
CHLORIDE: 99 mmol/L (ref 98–107)
CO2: 20 mmol/L — AB (ref 21–32)
CREATININE: 20.99 mg/dL — AB (ref 0.60–1.30)
Calcium, Total: 6.1 mg/dL — CL (ref 8.5–10.1)
GFR CALC AF AMER: 3 — AB
GFR CALC NON AF AMER: 2 — AB
Glucose: 70 mg/dL (ref 65–99)
OSMOLALITY: 304 (ref 275–301)
Potassium: 5.1 mmol/L (ref 3.5–5.1)
SODIUM: 136 mmol/L (ref 136–145)

## 2013-11-12 LAB — POTASSIUM: Potassium: 6.4 mmol/L — ABNORMAL HIGH (ref 3.5–5.1)

## 2013-11-13 DIAGNOSIS — T82898A Other specified complication of vascular prosthetic devices, implants and grafts, initial encounter: Secondary | ICD-10-CM | POA: Diagnosis not present

## 2013-11-13 DIAGNOSIS — N186 End stage renal disease: Secondary | ICD-10-CM | POA: Diagnosis not present

## 2013-11-13 DIAGNOSIS — N2581 Secondary hyperparathyroidism of renal origin: Secondary | ICD-10-CM | POA: Diagnosis not present

## 2013-11-13 DIAGNOSIS — T82598A Other mechanical complication of other cardiac and vascular devices and implants, initial encounter: Secondary | ICD-10-CM | POA: Diagnosis not present

## 2013-11-13 DIAGNOSIS — E669 Obesity, unspecified: Secondary | ICD-10-CM | POA: Diagnosis not present

## 2013-11-13 DIAGNOSIS — E875 Hyperkalemia: Secondary | ICD-10-CM | POA: Diagnosis not present

## 2013-11-13 DIAGNOSIS — I10 Essential (primary) hypertension: Secondary | ICD-10-CM | POA: Diagnosis not present

## 2013-11-13 DIAGNOSIS — I771 Stricture of artery: Secondary | ICD-10-CM | POA: Diagnosis not present

## 2013-11-13 LAB — BASIC METABOLIC PANEL
ANION GAP: 14 (ref 7–16)
BUN: 68 mg/dL — ABNORMAL HIGH (ref 7–18)
CO2: 25 mmol/L (ref 21–32)
CREATININE: 16.45 mg/dL — AB (ref 0.60–1.30)
Calcium, Total: 6.8 mg/dL — CL (ref 8.5–10.1)
Chloride: 98 mmol/L (ref 98–107)
EGFR (Non-African Amer.): 3 — ABNORMAL LOW
GFR CALC AF AMER: 3 — AB
Glucose: 64 mg/dL — ABNORMAL LOW (ref 65–99)
OSMOLALITY: 292 (ref 275–301)
POTASSIUM: 4.1 mmol/L (ref 3.5–5.1)
SODIUM: 137 mmol/L (ref 136–145)

## 2013-11-13 LAB — CBC WITH DIFFERENTIAL/PLATELET
Basophil #: 0.1 10*3/uL (ref 0.0–0.1)
Basophil %: 0.7 %
EOS ABS: 0.6 10*3/uL (ref 0.0–0.7)
Eosinophil %: 8.2 %
HCT: 36.2 % — ABNORMAL LOW (ref 40.0–52.0)
HGB: 11.3 g/dL — ABNORMAL LOW (ref 13.0–18.0)
LYMPHS PCT: 8.8 %
Lymphocyte #: 0.7 10*3/uL — ABNORMAL LOW (ref 1.0–3.6)
MCH: 23.9 pg — ABNORMAL LOW (ref 26.0–34.0)
MCHC: 31.3 g/dL — ABNORMAL LOW (ref 32.0–36.0)
MCV: 77 fL — AB (ref 80–100)
MONOS PCT: 5.3 %
Monocyte #: 0.4 x10 3/mm (ref 0.2–1.0)
NEUTROS ABS: 5.8 10*3/uL (ref 1.4–6.5)
Neutrophil %: 77 %
PLATELETS: 211 10*3/uL (ref 150–440)
RBC: 4.73 10*6/uL (ref 4.40–5.90)
RDW: 17.1 % — ABNORMAL HIGH (ref 11.5–14.5)
WBC: 7.5 10*3/uL (ref 3.8–10.6)

## 2013-11-15 DIAGNOSIS — N186 End stage renal disease: Secondary | ICD-10-CM | POA: Diagnosis not present

## 2013-11-16 DIAGNOSIS — D509 Iron deficiency anemia, unspecified: Secondary | ICD-10-CM | POA: Diagnosis not present

## 2013-11-16 DIAGNOSIS — N186 End stage renal disease: Secondary | ICD-10-CM | POA: Diagnosis not present

## 2013-11-16 DIAGNOSIS — N2581 Secondary hyperparathyroidism of renal origin: Secondary | ICD-10-CM | POA: Diagnosis not present

## 2013-11-23 DIAGNOSIS — N186 End stage renal disease: Secondary | ICD-10-CM | POA: Diagnosis not present

## 2013-12-16 DIAGNOSIS — N186 End stage renal disease: Secondary | ICD-10-CM | POA: Diagnosis not present

## 2013-12-17 DIAGNOSIS — D509 Iron deficiency anemia, unspecified: Secondary | ICD-10-CM | POA: Diagnosis not present

## 2013-12-17 DIAGNOSIS — N186 End stage renal disease: Secondary | ICD-10-CM | POA: Diagnosis not present

## 2013-12-31 DIAGNOSIS — N186 End stage renal disease: Secondary | ICD-10-CM | POA: Diagnosis not present

## 2014-01-15 DIAGNOSIS — N186 End stage renal disease: Secondary | ICD-10-CM | POA: Diagnosis not present

## 2014-01-16 DIAGNOSIS — Z992 Dependence on renal dialysis: Secondary | ICD-10-CM | POA: Diagnosis not present

## 2014-01-16 DIAGNOSIS — Z23 Encounter for immunization: Secondary | ICD-10-CM | POA: Diagnosis not present

## 2014-01-16 DIAGNOSIS — N186 End stage renal disease: Secondary | ICD-10-CM | POA: Diagnosis not present

## 2014-01-16 DIAGNOSIS — N2581 Secondary hyperparathyroidism of renal origin: Secondary | ICD-10-CM | POA: Diagnosis not present

## 2014-01-16 DIAGNOSIS — D509 Iron deficiency anemia, unspecified: Secondary | ICD-10-CM | POA: Diagnosis not present

## 2014-01-28 DIAGNOSIS — N186 End stage renal disease: Secondary | ICD-10-CM | POA: Diagnosis not present

## 2014-02-05 ENCOUNTER — Ambulatory Visit: Payer: Self-pay | Admitting: Vascular Surgery

## 2014-02-05 DIAGNOSIS — M7989 Other specified soft tissue disorders: Secondary | ICD-10-CM | POA: Diagnosis not present

## 2014-02-05 DIAGNOSIS — T82510A Breakdown (mechanical) of surgically created arteriovenous fistula, initial encounter: Secondary | ICD-10-CM | POA: Diagnosis not present

## 2014-02-05 DIAGNOSIS — Z9889 Other specified postprocedural states: Secondary | ICD-10-CM | POA: Diagnosis not present

## 2014-02-05 DIAGNOSIS — N186 End stage renal disease: Secondary | ICD-10-CM | POA: Diagnosis not present

## 2014-02-05 DIAGNOSIS — E669 Obesity, unspecified: Secondary | ICD-10-CM | POA: Diagnosis not present

## 2014-02-05 DIAGNOSIS — C966 Unifocal Langerhans-cell histiocytosis: Secondary | ICD-10-CM | POA: Diagnosis not present

## 2014-02-05 DIAGNOSIS — T829XXA Unspecified complication of cardiac and vascular prosthetic device, implant and graft, initial encounter: Secondary | ICD-10-CM | POA: Diagnosis not present

## 2014-02-05 DIAGNOSIS — I509 Heart failure, unspecified: Secondary | ICD-10-CM | POA: Diagnosis not present

## 2014-02-05 DIAGNOSIS — Z992 Dependence on renal dialysis: Secondary | ICD-10-CM | POA: Diagnosis not present

## 2014-02-05 DIAGNOSIS — Z7982 Long term (current) use of aspirin: Secondary | ICD-10-CM | POA: Diagnosis not present

## 2014-02-05 DIAGNOSIS — T8249XD Other complication of vascular dialysis catheter, subsequent encounter: Secondary | ICD-10-CM | POA: Diagnosis not present

## 2014-02-15 DIAGNOSIS — Z992 Dependence on renal dialysis: Secondary | ICD-10-CM | POA: Diagnosis not present

## 2014-02-15 DIAGNOSIS — N186 End stage renal disease: Secondary | ICD-10-CM | POA: Diagnosis not present

## 2014-02-18 DIAGNOSIS — N186 End stage renal disease: Secondary | ICD-10-CM | POA: Diagnosis not present

## 2014-02-18 DIAGNOSIS — Z992 Dependence on renal dialysis: Secondary | ICD-10-CM | POA: Diagnosis not present

## 2014-02-18 DIAGNOSIS — N2581 Secondary hyperparathyroidism of renal origin: Secondary | ICD-10-CM | POA: Diagnosis not present

## 2014-02-18 DIAGNOSIS — D509 Iron deficiency anemia, unspecified: Secondary | ICD-10-CM | POA: Diagnosis not present

## 2014-02-20 DIAGNOSIS — D509 Iron deficiency anemia, unspecified: Secondary | ICD-10-CM | POA: Diagnosis not present

## 2014-02-20 DIAGNOSIS — Z992 Dependence on renal dialysis: Secondary | ICD-10-CM | POA: Diagnosis not present

## 2014-02-20 DIAGNOSIS — N186 End stage renal disease: Secondary | ICD-10-CM | POA: Diagnosis not present

## 2014-02-20 DIAGNOSIS — N2581 Secondary hyperparathyroidism of renal origin: Secondary | ICD-10-CM | POA: Diagnosis not present

## 2014-02-22 DIAGNOSIS — Z992 Dependence on renal dialysis: Secondary | ICD-10-CM | POA: Diagnosis not present

## 2014-02-22 DIAGNOSIS — D509 Iron deficiency anemia, unspecified: Secondary | ICD-10-CM | POA: Diagnosis not present

## 2014-02-22 DIAGNOSIS — N186 End stage renal disease: Secondary | ICD-10-CM | POA: Diagnosis not present

## 2014-02-22 DIAGNOSIS — N2581 Secondary hyperparathyroidism of renal origin: Secondary | ICD-10-CM | POA: Diagnosis not present

## 2014-02-25 DIAGNOSIS — N186 End stage renal disease: Secondary | ICD-10-CM | POA: Diagnosis not present

## 2014-02-25 DIAGNOSIS — D509 Iron deficiency anemia, unspecified: Secondary | ICD-10-CM | POA: Diagnosis not present

## 2014-02-25 DIAGNOSIS — Z992 Dependence on renal dialysis: Secondary | ICD-10-CM | POA: Diagnosis not present

## 2014-02-25 DIAGNOSIS — N2581 Secondary hyperparathyroidism of renal origin: Secondary | ICD-10-CM | POA: Diagnosis not present

## 2014-02-27 DIAGNOSIS — N2581 Secondary hyperparathyroidism of renal origin: Secondary | ICD-10-CM | POA: Diagnosis not present

## 2014-02-27 DIAGNOSIS — Z992 Dependence on renal dialysis: Secondary | ICD-10-CM | POA: Diagnosis not present

## 2014-02-27 DIAGNOSIS — D509 Iron deficiency anemia, unspecified: Secondary | ICD-10-CM | POA: Diagnosis not present

## 2014-02-27 DIAGNOSIS — N186 End stage renal disease: Secondary | ICD-10-CM | POA: Diagnosis not present

## 2014-03-01 DIAGNOSIS — N2581 Secondary hyperparathyroidism of renal origin: Secondary | ICD-10-CM | POA: Diagnosis not present

## 2014-03-01 DIAGNOSIS — D509 Iron deficiency anemia, unspecified: Secondary | ICD-10-CM | POA: Diagnosis not present

## 2014-03-01 DIAGNOSIS — Z992 Dependence on renal dialysis: Secondary | ICD-10-CM | POA: Diagnosis not present

## 2014-03-01 DIAGNOSIS — N186 End stage renal disease: Secondary | ICD-10-CM | POA: Diagnosis not present

## 2014-03-04 DIAGNOSIS — Z992 Dependence on renal dialysis: Secondary | ICD-10-CM | POA: Diagnosis not present

## 2014-03-04 DIAGNOSIS — N186 End stage renal disease: Secondary | ICD-10-CM | POA: Diagnosis not present

## 2014-03-04 DIAGNOSIS — N2581 Secondary hyperparathyroidism of renal origin: Secondary | ICD-10-CM | POA: Diagnosis not present

## 2014-03-04 DIAGNOSIS — D509 Iron deficiency anemia, unspecified: Secondary | ICD-10-CM | POA: Diagnosis not present

## 2014-03-06 DIAGNOSIS — N186 End stage renal disease: Secondary | ICD-10-CM | POA: Diagnosis not present

## 2014-03-06 DIAGNOSIS — Z992 Dependence on renal dialysis: Secondary | ICD-10-CM | POA: Diagnosis not present

## 2014-03-06 DIAGNOSIS — N2581 Secondary hyperparathyroidism of renal origin: Secondary | ICD-10-CM | POA: Diagnosis not present

## 2014-03-06 DIAGNOSIS — D509 Iron deficiency anemia, unspecified: Secondary | ICD-10-CM | POA: Diagnosis not present

## 2014-03-08 DIAGNOSIS — N2581 Secondary hyperparathyroidism of renal origin: Secondary | ICD-10-CM | POA: Diagnosis not present

## 2014-03-08 DIAGNOSIS — D509 Iron deficiency anemia, unspecified: Secondary | ICD-10-CM | POA: Diagnosis not present

## 2014-03-08 DIAGNOSIS — Z992 Dependence on renal dialysis: Secondary | ICD-10-CM | POA: Diagnosis not present

## 2014-03-08 DIAGNOSIS — N186 End stage renal disease: Secondary | ICD-10-CM | POA: Diagnosis not present

## 2014-03-11 DIAGNOSIS — N186 End stage renal disease: Secondary | ICD-10-CM | POA: Diagnosis not present

## 2014-03-11 DIAGNOSIS — D509 Iron deficiency anemia, unspecified: Secondary | ICD-10-CM | POA: Diagnosis not present

## 2014-03-11 DIAGNOSIS — N2581 Secondary hyperparathyroidism of renal origin: Secondary | ICD-10-CM | POA: Diagnosis not present

## 2014-03-11 DIAGNOSIS — Z992 Dependence on renal dialysis: Secondary | ICD-10-CM | POA: Diagnosis not present

## 2014-03-13 DIAGNOSIS — N2581 Secondary hyperparathyroidism of renal origin: Secondary | ICD-10-CM | POA: Diagnosis not present

## 2014-03-13 DIAGNOSIS — N186 End stage renal disease: Secondary | ICD-10-CM | POA: Diagnosis not present

## 2014-03-13 DIAGNOSIS — D509 Iron deficiency anemia, unspecified: Secondary | ICD-10-CM | POA: Diagnosis not present

## 2014-03-13 DIAGNOSIS — Z992 Dependence on renal dialysis: Secondary | ICD-10-CM | POA: Diagnosis not present

## 2014-03-15 DIAGNOSIS — N186 End stage renal disease: Secondary | ICD-10-CM | POA: Diagnosis not present

## 2014-03-15 DIAGNOSIS — D509 Iron deficiency anemia, unspecified: Secondary | ICD-10-CM | POA: Diagnosis not present

## 2014-03-15 DIAGNOSIS — N2581 Secondary hyperparathyroidism of renal origin: Secondary | ICD-10-CM | POA: Diagnosis not present

## 2014-03-15 DIAGNOSIS — Z992 Dependence on renal dialysis: Secondary | ICD-10-CM | POA: Diagnosis not present

## 2014-03-17 DIAGNOSIS — Z992 Dependence on renal dialysis: Secondary | ICD-10-CM | POA: Diagnosis not present

## 2014-03-17 DIAGNOSIS — N186 End stage renal disease: Secondary | ICD-10-CM | POA: Diagnosis not present

## 2014-03-18 DIAGNOSIS — N186 End stage renal disease: Secondary | ICD-10-CM | POA: Diagnosis not present

## 2014-03-18 DIAGNOSIS — N2581 Secondary hyperparathyroidism of renal origin: Secondary | ICD-10-CM | POA: Diagnosis not present

## 2014-03-18 DIAGNOSIS — Z992 Dependence on renal dialysis: Secondary | ICD-10-CM | POA: Diagnosis not present

## 2014-03-18 DIAGNOSIS — D509 Iron deficiency anemia, unspecified: Secondary | ICD-10-CM | POA: Diagnosis not present

## 2014-03-21 DIAGNOSIS — N186 End stage renal disease: Secondary | ICD-10-CM | POA: Diagnosis not present

## 2014-03-27 ENCOUNTER — Encounter (HOSPITAL_COMMUNITY): Payer: Self-pay | Admitting: Surgery

## 2014-04-04 ENCOUNTER — Ambulatory Visit: Payer: Self-pay | Admitting: Vascular Surgery

## 2014-04-04 DIAGNOSIS — N186 End stage renal disease: Secondary | ICD-10-CM | POA: Diagnosis not present

## 2014-04-04 DIAGNOSIS — I509 Heart failure, unspecified: Secondary | ICD-10-CM | POA: Diagnosis not present

## 2014-04-04 DIAGNOSIS — T82868A Thrombosis of vascular prosthetic devices, implants and grafts, initial encounter: Secondary | ICD-10-CM | POA: Diagnosis not present

## 2014-04-04 DIAGNOSIS — I871 Compression of vein: Secondary | ICD-10-CM | POA: Diagnosis not present

## 2014-04-04 DIAGNOSIS — I10 Essential (primary) hypertension: Secondary | ICD-10-CM | POA: Diagnosis not present

## 2014-04-04 DIAGNOSIS — Z992 Dependence on renal dialysis: Secondary | ICD-10-CM | POA: Diagnosis not present

## 2014-04-04 DIAGNOSIS — I868 Varicose veins of other specified sites: Secondary | ICD-10-CM | POA: Diagnosis not present

## 2014-04-04 DIAGNOSIS — Z7982 Long term (current) use of aspirin: Secondary | ICD-10-CM | POA: Diagnosis not present

## 2014-04-17 DIAGNOSIS — Z992 Dependence on renal dialysis: Secondary | ICD-10-CM | POA: Diagnosis not present

## 2014-04-17 DIAGNOSIS — N186 End stage renal disease: Secondary | ICD-10-CM | POA: Diagnosis not present

## 2014-04-19 DIAGNOSIS — D509 Iron deficiency anemia, unspecified: Secondary | ICD-10-CM | POA: Diagnosis not present

## 2014-04-19 DIAGNOSIS — N186 End stage renal disease: Secondary | ICD-10-CM | POA: Diagnosis not present

## 2014-04-19 DIAGNOSIS — Z992 Dependence on renal dialysis: Secondary | ICD-10-CM | POA: Diagnosis not present

## 2014-04-19 DIAGNOSIS — N2581 Secondary hyperparathyroidism of renal origin: Secondary | ICD-10-CM | POA: Diagnosis not present

## 2014-04-22 DIAGNOSIS — N2581 Secondary hyperparathyroidism of renal origin: Secondary | ICD-10-CM | POA: Diagnosis not present

## 2014-04-22 DIAGNOSIS — D509 Iron deficiency anemia, unspecified: Secondary | ICD-10-CM | POA: Diagnosis not present

## 2014-04-22 DIAGNOSIS — Z992 Dependence on renal dialysis: Secondary | ICD-10-CM | POA: Diagnosis not present

## 2014-04-22 DIAGNOSIS — N186 End stage renal disease: Secondary | ICD-10-CM | POA: Diagnosis not present

## 2014-04-24 DIAGNOSIS — N2581 Secondary hyperparathyroidism of renal origin: Secondary | ICD-10-CM | POA: Diagnosis not present

## 2014-04-24 DIAGNOSIS — N186 End stage renal disease: Secondary | ICD-10-CM | POA: Diagnosis not present

## 2014-04-24 DIAGNOSIS — Z992 Dependence on renal dialysis: Secondary | ICD-10-CM | POA: Diagnosis not present

## 2014-04-24 DIAGNOSIS — D509 Iron deficiency anemia, unspecified: Secondary | ICD-10-CM | POA: Diagnosis not present

## 2014-04-26 DIAGNOSIS — Z992 Dependence on renal dialysis: Secondary | ICD-10-CM | POA: Diagnosis not present

## 2014-04-26 DIAGNOSIS — D509 Iron deficiency anemia, unspecified: Secondary | ICD-10-CM | POA: Diagnosis not present

## 2014-04-26 DIAGNOSIS — N186 End stage renal disease: Secondary | ICD-10-CM | POA: Diagnosis not present

## 2014-04-26 DIAGNOSIS — N2581 Secondary hyperparathyroidism of renal origin: Secondary | ICD-10-CM | POA: Diagnosis not present

## 2014-04-29 DIAGNOSIS — N2581 Secondary hyperparathyroidism of renal origin: Secondary | ICD-10-CM | POA: Diagnosis not present

## 2014-04-29 DIAGNOSIS — D509 Iron deficiency anemia, unspecified: Secondary | ICD-10-CM | POA: Diagnosis not present

## 2014-04-29 DIAGNOSIS — N186 End stage renal disease: Secondary | ICD-10-CM | POA: Diagnosis not present

## 2014-04-29 DIAGNOSIS — Z992 Dependence on renal dialysis: Secondary | ICD-10-CM | POA: Diagnosis not present

## 2014-05-01 DIAGNOSIS — N186 End stage renal disease: Secondary | ICD-10-CM | POA: Diagnosis not present

## 2014-05-01 DIAGNOSIS — D509 Iron deficiency anemia, unspecified: Secondary | ICD-10-CM | POA: Diagnosis not present

## 2014-05-01 DIAGNOSIS — Z992 Dependence on renal dialysis: Secondary | ICD-10-CM | POA: Diagnosis not present

## 2014-05-01 DIAGNOSIS — N2581 Secondary hyperparathyroidism of renal origin: Secondary | ICD-10-CM | POA: Diagnosis not present

## 2014-05-03 DIAGNOSIS — N186 End stage renal disease: Secondary | ICD-10-CM | POA: Diagnosis not present

## 2014-05-03 DIAGNOSIS — D509 Iron deficiency anemia, unspecified: Secondary | ICD-10-CM | POA: Diagnosis not present

## 2014-05-03 DIAGNOSIS — Z992 Dependence on renal dialysis: Secondary | ICD-10-CM | POA: Diagnosis not present

## 2014-05-03 DIAGNOSIS — N2581 Secondary hyperparathyroidism of renal origin: Secondary | ICD-10-CM | POA: Diagnosis not present

## 2014-05-06 DIAGNOSIS — D509 Iron deficiency anemia, unspecified: Secondary | ICD-10-CM | POA: Diagnosis not present

## 2014-05-06 DIAGNOSIS — Z992 Dependence on renal dialysis: Secondary | ICD-10-CM | POA: Diagnosis not present

## 2014-05-06 DIAGNOSIS — N186 End stage renal disease: Secondary | ICD-10-CM | POA: Diagnosis not present

## 2014-05-06 DIAGNOSIS — N2581 Secondary hyperparathyroidism of renal origin: Secondary | ICD-10-CM | POA: Diagnosis not present

## 2014-05-08 DIAGNOSIS — N2581 Secondary hyperparathyroidism of renal origin: Secondary | ICD-10-CM | POA: Diagnosis not present

## 2014-05-08 DIAGNOSIS — N186 End stage renal disease: Secondary | ICD-10-CM | POA: Diagnosis not present

## 2014-05-08 DIAGNOSIS — Z992 Dependence on renal dialysis: Secondary | ICD-10-CM | POA: Diagnosis not present

## 2014-05-08 DIAGNOSIS — D509 Iron deficiency anemia, unspecified: Secondary | ICD-10-CM | POA: Diagnosis not present

## 2014-05-12 ENCOUNTER — Ambulatory Visit: Payer: Self-pay | Admitting: Vascular Surgery

## 2014-05-12 DIAGNOSIS — T82868S Thrombosis of vascular prosthetic devices, implants and grafts, sequela: Secondary | ICD-10-CM | POA: Diagnosis not present

## 2014-05-12 DIAGNOSIS — N186 End stage renal disease: Secondary | ICD-10-CM | POA: Diagnosis not present

## 2014-05-12 DIAGNOSIS — I509 Heart failure, unspecified: Secondary | ICD-10-CM | POA: Diagnosis not present

## 2014-05-12 DIAGNOSIS — T82868A Thrombosis of vascular prosthetic devices, implants and grafts, initial encounter: Secondary | ICD-10-CM | POA: Diagnosis not present

## 2014-05-12 DIAGNOSIS — Z992 Dependence on renal dialysis: Secondary | ICD-10-CM | POA: Diagnosis not present

## 2014-05-13 DIAGNOSIS — N186 End stage renal disease: Secondary | ICD-10-CM | POA: Diagnosis not present

## 2014-05-13 DIAGNOSIS — N2581 Secondary hyperparathyroidism of renal origin: Secondary | ICD-10-CM | POA: Diagnosis not present

## 2014-05-13 DIAGNOSIS — Z992 Dependence on renal dialysis: Secondary | ICD-10-CM | POA: Diagnosis not present

## 2014-05-13 DIAGNOSIS — D509 Iron deficiency anemia, unspecified: Secondary | ICD-10-CM | POA: Diagnosis not present

## 2014-05-15 DIAGNOSIS — D509 Iron deficiency anemia, unspecified: Secondary | ICD-10-CM | POA: Diagnosis not present

## 2014-05-15 DIAGNOSIS — N2581 Secondary hyperparathyroidism of renal origin: Secondary | ICD-10-CM | POA: Diagnosis not present

## 2014-05-15 DIAGNOSIS — N186 End stage renal disease: Secondary | ICD-10-CM | POA: Diagnosis not present

## 2014-05-15 DIAGNOSIS — Z992 Dependence on renal dialysis: Secondary | ICD-10-CM | POA: Diagnosis not present

## 2014-05-17 DIAGNOSIS — Z992 Dependence on renal dialysis: Secondary | ICD-10-CM | POA: Diagnosis not present

## 2014-05-17 DIAGNOSIS — N186 End stage renal disease: Secondary | ICD-10-CM | POA: Diagnosis not present

## 2014-05-17 DIAGNOSIS — D509 Iron deficiency anemia, unspecified: Secondary | ICD-10-CM | POA: Diagnosis not present

## 2014-05-17 DIAGNOSIS — N2581 Secondary hyperparathyroidism of renal origin: Secondary | ICD-10-CM | POA: Diagnosis not present

## 2014-05-18 DIAGNOSIS — N186 End stage renal disease: Secondary | ICD-10-CM | POA: Diagnosis not present

## 2014-05-18 DIAGNOSIS — Z992 Dependence on renal dialysis: Secondary | ICD-10-CM | POA: Diagnosis not present

## 2014-05-20 DIAGNOSIS — D509 Iron deficiency anemia, unspecified: Secondary | ICD-10-CM | POA: Diagnosis not present

## 2014-05-20 DIAGNOSIS — Z992 Dependence on renal dialysis: Secondary | ICD-10-CM | POA: Diagnosis not present

## 2014-05-20 DIAGNOSIS — N2581 Secondary hyperparathyroidism of renal origin: Secondary | ICD-10-CM | POA: Diagnosis not present

## 2014-05-20 DIAGNOSIS — N186 End stage renal disease: Secondary | ICD-10-CM | POA: Diagnosis not present

## 2014-05-21 DIAGNOSIS — T82868S Thrombosis of vascular prosthetic devices, implants and grafts, sequela: Secondary | ICD-10-CM | POA: Diagnosis not present

## 2014-05-22 DIAGNOSIS — N2581 Secondary hyperparathyroidism of renal origin: Secondary | ICD-10-CM | POA: Diagnosis not present

## 2014-05-22 DIAGNOSIS — N186 End stage renal disease: Secondary | ICD-10-CM | POA: Diagnosis not present

## 2014-05-22 DIAGNOSIS — D509 Iron deficiency anemia, unspecified: Secondary | ICD-10-CM | POA: Diagnosis not present

## 2014-05-22 DIAGNOSIS — Z992 Dependence on renal dialysis: Secondary | ICD-10-CM | POA: Diagnosis not present

## 2014-05-24 DIAGNOSIS — D509 Iron deficiency anemia, unspecified: Secondary | ICD-10-CM | POA: Diagnosis not present

## 2014-05-24 DIAGNOSIS — N186 End stage renal disease: Secondary | ICD-10-CM | POA: Diagnosis not present

## 2014-05-24 DIAGNOSIS — N2581 Secondary hyperparathyroidism of renal origin: Secondary | ICD-10-CM | POA: Diagnosis not present

## 2014-05-24 DIAGNOSIS — Z992 Dependence on renal dialysis: Secondary | ICD-10-CM | POA: Diagnosis not present

## 2014-05-27 DIAGNOSIS — D509 Iron deficiency anemia, unspecified: Secondary | ICD-10-CM | POA: Diagnosis not present

## 2014-05-27 DIAGNOSIS — Z992 Dependence on renal dialysis: Secondary | ICD-10-CM | POA: Diagnosis not present

## 2014-05-27 DIAGNOSIS — N2581 Secondary hyperparathyroidism of renal origin: Secondary | ICD-10-CM | POA: Diagnosis not present

## 2014-05-27 DIAGNOSIS — N186 End stage renal disease: Secondary | ICD-10-CM | POA: Diagnosis not present

## 2014-05-29 DIAGNOSIS — N2581 Secondary hyperparathyroidism of renal origin: Secondary | ICD-10-CM | POA: Diagnosis not present

## 2014-05-29 DIAGNOSIS — Z992 Dependence on renal dialysis: Secondary | ICD-10-CM | POA: Diagnosis not present

## 2014-05-29 DIAGNOSIS — D509 Iron deficiency anemia, unspecified: Secondary | ICD-10-CM | POA: Diagnosis not present

## 2014-05-29 DIAGNOSIS — N186 End stage renal disease: Secondary | ICD-10-CM | POA: Diagnosis not present

## 2014-05-31 DIAGNOSIS — Z992 Dependence on renal dialysis: Secondary | ICD-10-CM | POA: Diagnosis not present

## 2014-05-31 DIAGNOSIS — N2581 Secondary hyperparathyroidism of renal origin: Secondary | ICD-10-CM | POA: Diagnosis not present

## 2014-05-31 DIAGNOSIS — D509 Iron deficiency anemia, unspecified: Secondary | ICD-10-CM | POA: Diagnosis not present

## 2014-05-31 DIAGNOSIS — N186 End stage renal disease: Secondary | ICD-10-CM | POA: Diagnosis not present

## 2014-06-05 DIAGNOSIS — N2581 Secondary hyperparathyroidism of renal origin: Secondary | ICD-10-CM | POA: Diagnosis not present

## 2014-06-05 DIAGNOSIS — Z992 Dependence on renal dialysis: Secondary | ICD-10-CM | POA: Diagnosis not present

## 2014-06-05 DIAGNOSIS — N186 End stage renal disease: Secondary | ICD-10-CM | POA: Diagnosis not present

## 2014-06-05 DIAGNOSIS — D509 Iron deficiency anemia, unspecified: Secondary | ICD-10-CM | POA: Diagnosis not present

## 2014-06-07 DIAGNOSIS — Z992 Dependence on renal dialysis: Secondary | ICD-10-CM | POA: Diagnosis not present

## 2014-06-07 DIAGNOSIS — D509 Iron deficiency anemia, unspecified: Secondary | ICD-10-CM | POA: Diagnosis not present

## 2014-06-07 DIAGNOSIS — N186 End stage renal disease: Secondary | ICD-10-CM | POA: Diagnosis not present

## 2014-06-07 DIAGNOSIS — N2581 Secondary hyperparathyroidism of renal origin: Secondary | ICD-10-CM | POA: Diagnosis not present

## 2014-06-10 DIAGNOSIS — D509 Iron deficiency anemia, unspecified: Secondary | ICD-10-CM | POA: Diagnosis not present

## 2014-06-10 DIAGNOSIS — Z992 Dependence on renal dialysis: Secondary | ICD-10-CM | POA: Diagnosis not present

## 2014-06-10 DIAGNOSIS — N186 End stage renal disease: Secondary | ICD-10-CM | POA: Diagnosis not present

## 2014-06-10 DIAGNOSIS — N2581 Secondary hyperparathyroidism of renal origin: Secondary | ICD-10-CM | POA: Diagnosis not present

## 2014-06-12 DIAGNOSIS — Z992 Dependence on renal dialysis: Secondary | ICD-10-CM | POA: Diagnosis not present

## 2014-06-12 DIAGNOSIS — D509 Iron deficiency anemia, unspecified: Secondary | ICD-10-CM | POA: Diagnosis not present

## 2014-06-12 DIAGNOSIS — N186 End stage renal disease: Secondary | ICD-10-CM | POA: Diagnosis not present

## 2014-06-12 DIAGNOSIS — N2581 Secondary hyperparathyroidism of renal origin: Secondary | ICD-10-CM | POA: Diagnosis not present

## 2014-06-14 DIAGNOSIS — Z992 Dependence on renal dialysis: Secondary | ICD-10-CM | POA: Diagnosis not present

## 2014-06-14 DIAGNOSIS — D509 Iron deficiency anemia, unspecified: Secondary | ICD-10-CM | POA: Diagnosis not present

## 2014-06-14 DIAGNOSIS — N2581 Secondary hyperparathyroidism of renal origin: Secondary | ICD-10-CM | POA: Diagnosis not present

## 2014-06-14 DIAGNOSIS — N186 End stage renal disease: Secondary | ICD-10-CM | POA: Diagnosis not present

## 2014-06-16 DIAGNOSIS — N186 End stage renal disease: Secondary | ICD-10-CM | POA: Diagnosis not present

## 2014-06-16 DIAGNOSIS — Z992 Dependence on renal dialysis: Secondary | ICD-10-CM | POA: Diagnosis not present

## 2014-06-17 DIAGNOSIS — D509 Iron deficiency anemia, unspecified: Secondary | ICD-10-CM | POA: Diagnosis not present

## 2014-06-17 DIAGNOSIS — Z992 Dependence on renal dialysis: Secondary | ICD-10-CM | POA: Diagnosis not present

## 2014-06-17 DIAGNOSIS — N186 End stage renal disease: Secondary | ICD-10-CM | POA: Diagnosis not present

## 2014-06-17 DIAGNOSIS — N2581 Secondary hyperparathyroidism of renal origin: Secondary | ICD-10-CM | POA: Diagnosis not present

## 2014-06-17 DIAGNOSIS — E611 Iron deficiency: Secondary | ICD-10-CM | POA: Diagnosis not present

## 2014-06-19 DIAGNOSIS — N186 End stage renal disease: Secondary | ICD-10-CM | POA: Diagnosis not present

## 2014-06-19 DIAGNOSIS — N2581 Secondary hyperparathyroidism of renal origin: Secondary | ICD-10-CM | POA: Diagnosis not present

## 2014-06-19 DIAGNOSIS — E611 Iron deficiency: Secondary | ICD-10-CM | POA: Diagnosis not present

## 2014-06-19 DIAGNOSIS — Z992 Dependence on renal dialysis: Secondary | ICD-10-CM | POA: Diagnosis not present

## 2014-06-19 DIAGNOSIS — D509 Iron deficiency anemia, unspecified: Secondary | ICD-10-CM | POA: Diagnosis not present

## 2014-06-21 DIAGNOSIS — D509 Iron deficiency anemia, unspecified: Secondary | ICD-10-CM | POA: Diagnosis not present

## 2014-06-21 DIAGNOSIS — N186 End stage renal disease: Secondary | ICD-10-CM | POA: Diagnosis not present

## 2014-06-21 DIAGNOSIS — E611 Iron deficiency: Secondary | ICD-10-CM | POA: Diagnosis not present

## 2014-06-21 DIAGNOSIS — Z992 Dependence on renal dialysis: Secondary | ICD-10-CM | POA: Diagnosis not present

## 2014-06-21 DIAGNOSIS — N2581 Secondary hyperparathyroidism of renal origin: Secondary | ICD-10-CM | POA: Diagnosis not present

## 2014-06-24 DIAGNOSIS — N2581 Secondary hyperparathyroidism of renal origin: Secondary | ICD-10-CM | POA: Diagnosis not present

## 2014-06-24 DIAGNOSIS — N186 End stage renal disease: Secondary | ICD-10-CM | POA: Diagnosis not present

## 2014-06-24 DIAGNOSIS — E611 Iron deficiency: Secondary | ICD-10-CM | POA: Diagnosis not present

## 2014-06-24 DIAGNOSIS — D509 Iron deficiency anemia, unspecified: Secondary | ICD-10-CM | POA: Diagnosis not present

## 2014-06-24 DIAGNOSIS — Z992 Dependence on renal dialysis: Secondary | ICD-10-CM | POA: Diagnosis not present

## 2014-06-26 DIAGNOSIS — E611 Iron deficiency: Secondary | ICD-10-CM | POA: Diagnosis not present

## 2014-06-26 DIAGNOSIS — N2581 Secondary hyperparathyroidism of renal origin: Secondary | ICD-10-CM | POA: Diagnosis not present

## 2014-06-26 DIAGNOSIS — N186 End stage renal disease: Secondary | ICD-10-CM | POA: Diagnosis not present

## 2014-06-26 DIAGNOSIS — Z992 Dependence on renal dialysis: Secondary | ICD-10-CM | POA: Diagnosis not present

## 2014-06-26 DIAGNOSIS — D509 Iron deficiency anemia, unspecified: Secondary | ICD-10-CM | POA: Diagnosis not present

## 2014-06-28 DIAGNOSIS — N186 End stage renal disease: Secondary | ICD-10-CM | POA: Diagnosis not present

## 2014-06-28 DIAGNOSIS — Z992 Dependence on renal dialysis: Secondary | ICD-10-CM | POA: Diagnosis not present

## 2014-06-28 DIAGNOSIS — E611 Iron deficiency: Secondary | ICD-10-CM | POA: Diagnosis not present

## 2014-06-28 DIAGNOSIS — D509 Iron deficiency anemia, unspecified: Secondary | ICD-10-CM | POA: Diagnosis not present

## 2014-06-28 DIAGNOSIS — N2581 Secondary hyperparathyroidism of renal origin: Secondary | ICD-10-CM | POA: Diagnosis not present

## 2014-07-01 DIAGNOSIS — Z992 Dependence on renal dialysis: Secondary | ICD-10-CM | POA: Diagnosis not present

## 2014-07-01 DIAGNOSIS — D509 Iron deficiency anemia, unspecified: Secondary | ICD-10-CM | POA: Diagnosis not present

## 2014-07-01 DIAGNOSIS — N186 End stage renal disease: Secondary | ICD-10-CM | POA: Diagnosis not present

## 2014-07-01 DIAGNOSIS — E611 Iron deficiency: Secondary | ICD-10-CM | POA: Diagnosis not present

## 2014-07-01 DIAGNOSIS — N2581 Secondary hyperparathyroidism of renal origin: Secondary | ICD-10-CM | POA: Diagnosis not present

## 2014-07-03 DIAGNOSIS — Z992 Dependence on renal dialysis: Secondary | ICD-10-CM | POA: Diagnosis not present

## 2014-07-03 DIAGNOSIS — E611 Iron deficiency: Secondary | ICD-10-CM | POA: Diagnosis not present

## 2014-07-03 DIAGNOSIS — N2581 Secondary hyperparathyroidism of renal origin: Secondary | ICD-10-CM | POA: Diagnosis not present

## 2014-07-03 DIAGNOSIS — D509 Iron deficiency anemia, unspecified: Secondary | ICD-10-CM | POA: Diagnosis not present

## 2014-07-03 DIAGNOSIS — N186 End stage renal disease: Secondary | ICD-10-CM | POA: Diagnosis not present

## 2014-07-05 DIAGNOSIS — E611 Iron deficiency: Secondary | ICD-10-CM | POA: Diagnosis not present

## 2014-07-05 DIAGNOSIS — N186 End stage renal disease: Secondary | ICD-10-CM | POA: Diagnosis not present

## 2014-07-05 DIAGNOSIS — D509 Iron deficiency anemia, unspecified: Secondary | ICD-10-CM | POA: Diagnosis not present

## 2014-07-05 DIAGNOSIS — Z992 Dependence on renal dialysis: Secondary | ICD-10-CM | POA: Diagnosis not present

## 2014-07-05 DIAGNOSIS — N2581 Secondary hyperparathyroidism of renal origin: Secondary | ICD-10-CM | POA: Diagnosis not present

## 2014-07-08 DIAGNOSIS — Z992 Dependence on renal dialysis: Secondary | ICD-10-CM | POA: Diagnosis not present

## 2014-07-08 DIAGNOSIS — N186 End stage renal disease: Secondary | ICD-10-CM | POA: Diagnosis not present

## 2014-07-08 DIAGNOSIS — E611 Iron deficiency: Secondary | ICD-10-CM | POA: Diagnosis not present

## 2014-07-08 DIAGNOSIS — D509 Iron deficiency anemia, unspecified: Secondary | ICD-10-CM | POA: Diagnosis not present

## 2014-07-08 DIAGNOSIS — N2581 Secondary hyperparathyroidism of renal origin: Secondary | ICD-10-CM | POA: Diagnosis not present

## 2014-07-10 DIAGNOSIS — Z992 Dependence on renal dialysis: Secondary | ICD-10-CM | POA: Diagnosis not present

## 2014-07-10 DIAGNOSIS — N186 End stage renal disease: Secondary | ICD-10-CM | POA: Diagnosis not present

## 2014-07-10 DIAGNOSIS — D509 Iron deficiency anemia, unspecified: Secondary | ICD-10-CM | POA: Diagnosis not present

## 2014-07-10 DIAGNOSIS — N2581 Secondary hyperparathyroidism of renal origin: Secondary | ICD-10-CM | POA: Diagnosis not present

## 2014-07-10 DIAGNOSIS — E611 Iron deficiency: Secondary | ICD-10-CM | POA: Diagnosis not present

## 2014-07-12 DIAGNOSIS — D509 Iron deficiency anemia, unspecified: Secondary | ICD-10-CM | POA: Diagnosis not present

## 2014-07-12 DIAGNOSIS — N186 End stage renal disease: Secondary | ICD-10-CM | POA: Diagnosis not present

## 2014-07-12 DIAGNOSIS — Z992 Dependence on renal dialysis: Secondary | ICD-10-CM | POA: Diagnosis not present

## 2014-07-12 DIAGNOSIS — E611 Iron deficiency: Secondary | ICD-10-CM | POA: Diagnosis not present

## 2014-07-12 DIAGNOSIS — N2581 Secondary hyperparathyroidism of renal origin: Secondary | ICD-10-CM | POA: Diagnosis not present

## 2014-07-15 DIAGNOSIS — E611 Iron deficiency: Secondary | ICD-10-CM | POA: Diagnosis not present

## 2014-07-15 DIAGNOSIS — N186 End stage renal disease: Secondary | ICD-10-CM | POA: Diagnosis not present

## 2014-07-15 DIAGNOSIS — Z992 Dependence on renal dialysis: Secondary | ICD-10-CM | POA: Diagnosis not present

## 2014-07-15 DIAGNOSIS — D509 Iron deficiency anemia, unspecified: Secondary | ICD-10-CM | POA: Diagnosis not present

## 2014-07-15 DIAGNOSIS — N2581 Secondary hyperparathyroidism of renal origin: Secondary | ICD-10-CM | POA: Diagnosis not present

## 2014-07-17 DIAGNOSIS — N186 End stage renal disease: Secondary | ICD-10-CM | POA: Diagnosis not present

## 2014-07-17 DIAGNOSIS — D509 Iron deficiency anemia, unspecified: Secondary | ICD-10-CM | POA: Diagnosis not present

## 2014-07-17 DIAGNOSIS — E611 Iron deficiency: Secondary | ICD-10-CM | POA: Diagnosis not present

## 2014-07-17 DIAGNOSIS — Z992 Dependence on renal dialysis: Secondary | ICD-10-CM | POA: Diagnosis not present

## 2014-07-17 DIAGNOSIS — N2581 Secondary hyperparathyroidism of renal origin: Secondary | ICD-10-CM | POA: Diagnosis not present

## 2014-07-19 DIAGNOSIS — N2581 Secondary hyperparathyroidism of renal origin: Secondary | ICD-10-CM | POA: Diagnosis not present

## 2014-07-19 DIAGNOSIS — N186 End stage renal disease: Secondary | ICD-10-CM | POA: Diagnosis not present

## 2014-07-19 DIAGNOSIS — D509 Iron deficiency anemia, unspecified: Secondary | ICD-10-CM | POA: Diagnosis not present

## 2014-07-19 DIAGNOSIS — Z992 Dependence on renal dialysis: Secondary | ICD-10-CM | POA: Diagnosis not present

## 2014-07-22 DIAGNOSIS — N2581 Secondary hyperparathyroidism of renal origin: Secondary | ICD-10-CM | POA: Diagnosis not present

## 2014-07-22 DIAGNOSIS — Z992 Dependence on renal dialysis: Secondary | ICD-10-CM | POA: Diagnosis not present

## 2014-07-22 DIAGNOSIS — N186 End stage renal disease: Secondary | ICD-10-CM | POA: Diagnosis not present

## 2014-07-22 DIAGNOSIS — D509 Iron deficiency anemia, unspecified: Secondary | ICD-10-CM | POA: Diagnosis not present

## 2014-07-24 DIAGNOSIS — D509 Iron deficiency anemia, unspecified: Secondary | ICD-10-CM | POA: Diagnosis not present

## 2014-07-24 DIAGNOSIS — N186 End stage renal disease: Secondary | ICD-10-CM | POA: Diagnosis not present

## 2014-07-24 DIAGNOSIS — Z992 Dependence on renal dialysis: Secondary | ICD-10-CM | POA: Diagnosis not present

## 2014-07-24 DIAGNOSIS — N2581 Secondary hyperparathyroidism of renal origin: Secondary | ICD-10-CM | POA: Diagnosis not present

## 2014-07-26 DIAGNOSIS — D509 Iron deficiency anemia, unspecified: Secondary | ICD-10-CM | POA: Diagnosis not present

## 2014-07-26 DIAGNOSIS — Z992 Dependence on renal dialysis: Secondary | ICD-10-CM | POA: Diagnosis not present

## 2014-07-26 DIAGNOSIS — N2581 Secondary hyperparathyroidism of renal origin: Secondary | ICD-10-CM | POA: Diagnosis not present

## 2014-07-26 DIAGNOSIS — N186 End stage renal disease: Secondary | ICD-10-CM | POA: Diagnosis not present

## 2014-07-29 DIAGNOSIS — N2581 Secondary hyperparathyroidism of renal origin: Secondary | ICD-10-CM | POA: Diagnosis not present

## 2014-07-29 DIAGNOSIS — N186 End stage renal disease: Secondary | ICD-10-CM | POA: Diagnosis not present

## 2014-07-29 DIAGNOSIS — D509 Iron deficiency anemia, unspecified: Secondary | ICD-10-CM | POA: Diagnosis not present

## 2014-07-29 DIAGNOSIS — Z992 Dependence on renal dialysis: Secondary | ICD-10-CM | POA: Diagnosis not present

## 2014-07-31 DIAGNOSIS — D509 Iron deficiency anemia, unspecified: Secondary | ICD-10-CM | POA: Diagnosis not present

## 2014-07-31 DIAGNOSIS — N2581 Secondary hyperparathyroidism of renal origin: Secondary | ICD-10-CM | POA: Diagnosis not present

## 2014-07-31 DIAGNOSIS — Z992 Dependence on renal dialysis: Secondary | ICD-10-CM | POA: Diagnosis not present

## 2014-07-31 DIAGNOSIS — N186 End stage renal disease: Secondary | ICD-10-CM | POA: Diagnosis not present

## 2014-08-02 DIAGNOSIS — Z992 Dependence on renal dialysis: Secondary | ICD-10-CM | POA: Diagnosis not present

## 2014-08-02 DIAGNOSIS — D509 Iron deficiency anemia, unspecified: Secondary | ICD-10-CM | POA: Diagnosis not present

## 2014-08-02 DIAGNOSIS — N186 End stage renal disease: Secondary | ICD-10-CM | POA: Diagnosis not present

## 2014-08-02 DIAGNOSIS — N2581 Secondary hyperparathyroidism of renal origin: Secondary | ICD-10-CM | POA: Diagnosis not present

## 2014-08-05 DIAGNOSIS — N2581 Secondary hyperparathyroidism of renal origin: Secondary | ICD-10-CM | POA: Diagnosis not present

## 2014-08-05 DIAGNOSIS — N186 End stage renal disease: Secondary | ICD-10-CM | POA: Diagnosis not present

## 2014-08-05 DIAGNOSIS — Z992 Dependence on renal dialysis: Secondary | ICD-10-CM | POA: Diagnosis not present

## 2014-08-05 DIAGNOSIS — D509 Iron deficiency anemia, unspecified: Secondary | ICD-10-CM | POA: Diagnosis not present

## 2014-08-07 DIAGNOSIS — N186 End stage renal disease: Secondary | ICD-10-CM | POA: Diagnosis not present

## 2014-08-07 DIAGNOSIS — N2581 Secondary hyperparathyroidism of renal origin: Secondary | ICD-10-CM | POA: Diagnosis not present

## 2014-08-07 DIAGNOSIS — Z992 Dependence on renal dialysis: Secondary | ICD-10-CM | POA: Diagnosis not present

## 2014-08-07 DIAGNOSIS — D509 Iron deficiency anemia, unspecified: Secondary | ICD-10-CM | POA: Diagnosis not present

## 2014-08-09 NOTE — H&P (Signed)
PATIENT NAME:  [name] [name], [name] [name] MR#:  [id number] DATE OF BIRTH:  [birth year]  DATE OF ADMISSION:  05/15/2013  PRIMARY CARE PHYSICIAN: [facility].  HISTORY OF PRESENT ILLNESS: The patient is a 49 year old African-American male with past medical history significant for history of end-stage renal disease, who underwent right arm graft thrombolysis on 05/13/2013 by Dr. Lucky Cowboy, who presents back to the hospital because his graft has clotted again. Apparently, the patient underwent right arm graft declotting on the 26th, on Monday. He went to hemodialysis center on the 27th; however, hemodialysis center was not able to use that graft because it was clotted again, so he presented to the hospital for permanent catheter placement by Dr. Lucky Cowboy. However, he was noted to have potassium level as high as 6.7, and hospitalist service was contacted for admission for this patient who is to have hemodialysis here in the hospital.   PAST MEDICAL HISTORY: Significant for:  1. History of hypertension in the past, until the patient developed end-stage renal disease. 2. Congestive heart failure.  3. Questionable cardiomyopathy.  4. History of end-stage renal disease, on Tuesdays, Thursdays and Saturdays hemodialysis in [facility], Dr. Lowanda Foster is nephrologist.  5. History of obesity.  6. Irregular heartbeats. 7. 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   MEDICATIONS: The patient's medication list is as follows:  1. Aspirin 325 mg p.o. daily. 2. Nephro-Vite vitamin B complex with C and folic acid 1 tablet once daily. 3. Plavix 75 mg daily. 4. Renagel 800 mg 2 tablets 3 times daily with meals. 5. Sensipar 60 mg p.o. daily.  SURGERIES: Multiple grafts, the patient did have AV fistulas in the left arm as well as right arm, grafts in left upper extremity as well as right upper extremity, which are clotted. He had aneurysm in the left upper extremity. He also had umbilical hernia repair 10 years ago as well as this  current AV graft he has been using in the right upper extremity for the past 5 years.   ALLERGIES: None.   FAMILY HISTORY: History of strokes in the past, history of hypertension. The patient's father had MI at age of 29. The patient's mother had leukemia. The patient's brother has CKD.   SOCIAL HISTORY: The patient is married and has 2 children himself, also has stepson he raised. He does not smoke or drink alcohol. He is disabled for the past 13 years.   REVIEW OF SYSTEMS:  CONSTITUTIONAL: Positive for fatigue and weakness in his legs, also feeling stiff in his legs. His dry weight he tells me that it is 155.4, now he gained to 159 since his last hemodialysis was done last week, Tuesday. No fevers or chills, pains. Weight as above. EYES: Denies any blurry vision, double vision, glaucoma or cataracts.  ENT: Denies tinnitus, allergies, epistaxis, sinus pain, dentures or difficulty swallowing.  RESPIRATORY: Denies any cough, wheezes, asthma, COPD.  CARDIOVASCULAR: Denies any chest pain, arrhythmias, palpitations or syncope. Admits to having dyspnea on exertion, it would take him to go to the parking lot, and then he would need to stop to catch his breath. He admits to having some orthopnea. He uses 2 pillows to sleep. He denies any otherwise chest pains, palpitations or syncope.  GASTROINTESTINAL: Admits to nausea. Denies vomiting, diarrhea, abdominal pains or any change in bowel habits.  GENITOURINARY: Admits to having minimal urinary output. He has a few drops in a week. No dysuria, hematuria, frequency or incontinence.  ENDOCRINOLOGY: Denies any polydipsia, nocturia, thyroid problems, heat or  cold intolerance or thirst.  HEMATOLOGIC: Denies anemia, easy bruising or bleeding, swollen glands.  SKIN: Denies any acne, rashes or change in moles.  MUSCULOSKELETAL: Denies any arthritis, cramps, swelling or gout. NEUROLOGIC: Denies numbness, epilepsy. Admits of tremor in his right hand especially, but  both hands usually. It has been going on for a while. Now, it seems to be worsening in his right hand.  PSYCHIATRIC: No anxiety, depression, bipolar disorder or nervousness.   PHYSICAL EXAMINATION:  VITAL SIGNS: On arrival to the hospital, temperature 98.7, pulse was 82, respiration was 18, blood pressure 115/81, saturation was 99% on room air.  GENERAL: This is a well-developed, well-nourished, obese African-American male in no significant distress, lying on the stretcher.  HEENT: His pupils are equally reactive to light. Extraocular muscles are intact. No icterus or conjunctivitis. He has normal hearing. No pharyngeal erythema. Mucosa is moist.  NECK: No masses. Supple. Nontender. Thyroid not enlarged. No adenopathy. No JVD or carotid bruits bilaterally. Full range of motion.  LUNGS: Clear to auscultation in all fields. No rales, rhonchi, diminished breath sounds or wheezing. No labored inspirations, increased effort, dullness to percussion or overt respiratory distress.  CARDIOVASCULAR: S1, S2 appreciated. No murmurs, gallops or rubs. The rhythm is regular. PMI not lateralized. Chest is nontender to palpation, 1+ pedal pulses, trace to 1+ lower extremity edema. No calf tenderness or cyanosis was noted.  ABDOMEN: Soft, nontender. Bowel sounds are present. No hepatosplenomegaly or masses were noted.  RECTAL: Deferred.  MUSCLE STRENGTH: Able to move all extremities. No cyanosis, degenerative joint disease or kyphosis. Gait is not tested.  SKIN: No rashes, lesions, erythema, nodularity or induration. It was warm and dry to palpation. Multiple AV grafts were noted in the upper extremities bilaterally, in right as well as left upper extremity; however, in left upper extremity, one is pulsating with some thrill; however, not been used for a while, and then right upper extremity I do not hear any thrills or pulsations.  LYMPHATIC: No adenopathy in the cervical region.  NEUROLOGICAL: Cranial nerves grossly  intact. The patient does some tremor. No sensory deficits. No dysarthria. The patient is alert and oriented to time, person and place, cooperative. Memory is good. No significant confusion, agitation or depression was noted.   LABORATORY DATA: Labs are not done except a potassium level was checked by ABGs and was found to be 6.7. The patient's EKG showed normal sinus rhythm at 77 beats per minute, normal axis, low voltage QRS, prolonged QTc to 495 ms, but no acute ST-T changes were noted.   ASSESSMENT AND PLAN:  1. Hyperkalemia. Admit the patient to medical floor. Give him 1 dose of Kayexalate and continue patient on low rate of bicarbonate drip until hemodialysis is initiated.  2. End-stage renal disease. The patient's permanent catheter placement was postponed. The patient will receive temporary catheter placement, and he will get hemodialysis here in the hospital. Then, his potassium level will be rechecked, and likely PermCath catheter will be placed tomorrow.  3. Obesity. Get TSH as well as lipid panel.  4. History of hypertension. The patient seemed to be normotensive now.  5. History of cardiomyopathy. It would be good to get his echocardiogram done at some point to know his ejection fraction as the patient is not sure about that. However, he is not on any medications for cardiomyopathy.   TIME SPENT: 50 minutes.   ____________________________ Theodoro Grist, MD rv:lb D: 05/15/2013 12:00:22 ET T: 05/15/2013 12:47:42 ET JOB#: [id number]  cc:  Theodoro Grist, MD, <Dictator> [facility] MD ELECTRONICALLY SIGNED 05/27/2013 15:32

## 2014-08-09 NOTE — Op Note (Signed)
PATIENT NAME:  [name] [name], [name] [name] MR#:  [id number] DATE OF BIRTH:  [birth year]  DATE OF PROCEDURE:  02/05/2014  PREOPERATIVE DIAGNOSES:  1. End-stage renal disease.  2. Clotted right arm hero graft.   POSTOPERATIVE DIAGNOSES:  1. End-stage renal disease.  2. Clotted right arm hero graft.   PROCEDURES:  1. Ultrasound guidance for vascular access in both an antegrade and retrograde fashion to right arm hero graft.  2. Right arm shuntogram and central venogram.  3. Catheter directed thrombolysis with 4 mg tPA delivered with the AngioJet AVX catheter.  4. Mechanical rheolytic thrombectomy with the AngioJet AVX catheter.  5. Fogarty embolectomy TORCH hero plug.  6. Percutaneous transluminal angioplasty of proximal and mid graft with 7 mm diameter angioplasty balloon.  7. Percutaneous transluminal angioplasty of distal PTFE portion of the graft with 7 mm diameter angioplasty balloon.  8. Covered stent placement for residual thrombus and stenosis after angioplasty in the distal portion of the PTFE portion of the hero graft.   SURGEON: Leotis Pain, MD   ANESTHESIA: Local with moderate conscious sedation.   ESTIMATED BLOOD LOSS: 25 mL.   INDICATION FOR PROCEDURE: This is a 49 year old gentleman with end-stage renal disease. His right arm hero graft is clotted and we are attempting to salvage this. Risks and benefits were discussed. Informed consent was obtained.   DESCRIPTION OF PROCEDURE: The patient is brought to the dialysis suite were the lower extremity was sterilely prepped and draped and a sterile surgical field was created. The graft was accessed in both an antegrade and retrograde fashion crossing under direct ultrasound guidance due to the pulseless nature of the graft. Permanent images were recorded. Six French sheaths were placed in each way and 4000 units of intravenous heparin were given. The graft was seen to be clotted on imaging. I put Magic Torque wire through the central venous portion  and into the brachial artery, and 4 mg tPA was delivered throughout the graft, into the central veins, and into the brachial artery in a retrograde fashion. This was allowed to dwell. A Fogarty embolectomy was used to pull the arterial plug and mechanical rheolytic thrombectomy was performed throughout the graft. At this point, we had patency in the proximal portion of the graft with resolution or arterial plug, but there was residual thrombus and stenosis both in the proximal and mid graft likely to arterial access site, at the mid to distal graft and venous access site, and distally near the grommet. The 7 mm diameter angioplasty balloon was inflated through the retrograde sheath at the arterial access site with a good angiographic completion result and a retrograde sheath was removed. The antegrade sheath was then used to mechanical rheolytic thrombectomy was performed again to remove residual thrombus and a 7 mm diameter angioplasty balloon was inflated at the venous access site and distal portion of the graft. The venous access site looked much better, but the distal portion of the graft still had residual thrombus and stenosis and I elected to cover this area with a 7 mm diameter covered stent exchanged for a 7 French sheath and an 0.018 wire and used a 7 mm diameter 10 cm in length Viabahn covered stent and post dilated with a 7 mm balloon with an excellent angiographic completion result. The central venous portion of the graft was widely patent. The tip was in the right atrium. At this point the second sheath was removed, 4-0 Monocryl pursestring suture was placed. Pressure was held. Sterile dressing was placed.  The patient tolerated the procedure well and was taken to the recovery room in stable condition.    ____________________________ Algernon Huxley, MD jsd:JT D: 02/05/2014 14:44:51 ET T: 02/06/2014 05:52:45 ET JOB#: [id number]  cc: Algernon Huxley, MD, <Dictator> Algernon Huxley MD ELECTRONICALLY SIGNED  02/13/2014 15:26

## 2014-08-09 NOTE — Op Note (Signed)
PATIENT NAME:  [name] [name], [name] [name] MR#:  [id number] DATE OF BIRTH:  01/22/1966  DATE OF PROCEDURE:  05/13/2013  PREOPERATIVE DIAGNOSES: 1.  End-stage renal disease.  2.  Clotted right arm arteriovenous graft.  3.  Morbid obesity.  4.  Congestive heart failure.   POSTOPERATIVE DIAGNOSES: 1.  End-stage renal disease.  2.  Clotted right arm arteriovenous graft.  3.  Morbid obesity.  4.  Congestive heart failure.    PROCEDURES: 1.  Ultrasound guidance for vascular access to the arteriovenous graft in both an antegrade and retrograde fashion.  2.  Right upper extremity shuntogram.  3.  Catheter directed thrombolysis with 4 mg of TPA with the AngioJet AVX catheter to the graft.  4.  Mechanical rheolytic thrombectomy to the graft.  5.  Percutaneous transluminal angioplasty of arterial anastomosis with 6 mm diameter angioplasty balloon.  6.  Fogarty embolectomy for residual arterial plug to the arterial anastomosis.  7.  Percutaneous transluminal angioplasty of midportion of graft with 8 mm diameter angioplasty balloon.  8.  Percutaneous transluminal angioplasty of venous anastomosis with 8 mm diameter angioplasty balloon.  9.  An 8 mm diameter x 5 cm in length Viabahn stent placement for residual thrombus at venous anastomotic area with leading edge of previous stent placement.   SURGEON: Algernon Huxley, M.D.   ANESTHESIA: Local with moderate conscious sedation.   ESTIMATED BLOOD LOSS: 25 mL.  CONTRAST USED: 45 mL Visipaque.   INDICATION FOR PROCEDURE: A 49 year old male who has been on dialysis for over a decade. He has a graft that is several years old. This was recently declotted in [place]. He presented to my office for evaluation for salvage. He is brought in for attempt at declot today. Risks and benefits were discussed. Informed consent was obtained.   DESCRIPTION OF PROCEDURE: The patient is brought to the vascular suite and after intravenous sedation was obtained the right extremity was  sterilely prepped and draped. The graft was pulseless. It was accessed in both an antegrade and retrograde fashion under direct ultrasound guidance and 6-French sheaths were placed. The patient was given 3000 units of intravenous heparin. The graft had thrombus at the distal portion of the graft and at the venous anastomosis there was also an arterial plug proximally. Catheter directed thrombolysis with 4 mg of TPA was delivered to the graft. This was allowed to dwell. The arterial plug was treated initially with a 6 mm diameter angioplasty balloon with residual plug present and a Fogarty embolectomy balloon was used to pull the arterial plug free. This did result in resolution of the arterial anastomotic obstruction. The midportion of the graft had thrombus and some irregularity and in the venous anastomosis were previous stent was placed was still thrombosed. Mechanical rheolytic thrombectomy was performed with the AngioJet AVX catheter. That resolved most of the thrombus but not entirely. Balloon angioplasty was performed of the residual thrombosis, both in the midportion of the AV graft and at the venous anastomosis. The midportion looked much better, but there was still residual thrombus at the leading edge of the venous anastomotic stent, and I elected to cover this area with a new covered stent. An 8 mm diameter x 5 cm in length Viabahn stent was selected and deployed across this area including the old stent down to the axillary vein and back into the distal part of the graft. This was post dilated with an 8 mm balloon with a good angiographic completion result, and at this point I elected to  terminate the procedure. The sheath was removed. A 4-0 Monocryl pursestring suture was placed, pressure was held, and sterile dressing was placed. The patient tolerated the procedure well and was taken to the recovery room in stable condition. ____________________________ Algernon Huxley, MD jsd:sb D: 05/13/2013  11:31:05 ET T: 05/13/2013 12:18:58 ET JOB#: [id number]  cc: Algernon Huxley, MD, <Dictator> Fran Lowes, MD Algernon Huxley MD ELECTRONICALLY SIGNED 05/21/2013 11:05

## 2014-08-09 NOTE — Op Note (Signed)
PATIENT NAME:  [name] [name], SCHEID MR#:  [id number] DATE OF BIRTH:  Nov 24, 1965  DATE OF PROCEDURE:  11/05/2013  PREOPERATIVE DIAGNOSES: 1.  End-stage renal disease.  2.  Clotted right arm HeRO graft.   POSTOPERATIVE DIAGNOSES: 1.  End-stage renal disease.  2.  Clotted right arm HeRO graft.   PROCEDURE:  1.  Ultrasound guidance for vascular access in both an antegrade and retrograde fashion to the right arm HeRO graft.  2.  Catheter directed thrombolysis to the right arm HeRO graft.  3.  Mechanical rheolytic thrombectomy to the right arm HeRO graft.  4.  Fogarty embolectomy for arterial plug on the arterial anastomosis.  5. Percutaneous transluminal angioplasty of mid and distal graft with 7 mm diameter angioplasty balloon.   SURGEON: Algernon Huxley, M.D.   ANESTHESIA: Local with moderate conscious sedation.   ESTIMATED BLOOD LOSS: 25 mL.  CONTRAST USED: 30 mL Visipaque.   FLUOROSCOPY TIME: 3.4 minutes.   INDICATION FOR PROCEDURE: This is a 49 year old gentleman with end-stage renal disease with a clotted right arm HeRO graft. We are bringing him in for an attempt at salvage of this graft. The risks and benefits were discussed. Informed consent was obtained.   DESCRIPTION OF PROCEDURE: The patient is brought to the vascular suite. The right upper extremity was sterilely prepped and draped and a sterile surgical field was created. I initially accessed the graft in a retrograde fashion in the mid to distal portion. This demonstrated clear thrombosis of the AV graft with lots irregularity in the graft in the mid to distal portion. I was able to pass a wire in the brachial artery without difficulty and catheter directed thrombolysis with approximately 2 mg of tPA was deployed in the mid and proximal graft up to the brachial artery.   Mechanical rheolytic thrombectomy was performed and then a Fogarty embolectomy balloon was used to clear any residual arterial plug. With this, we were able to clear  the arterial limb of the graft. The mid to distal portion of the graft still had significant residual thrombosis. I then accessed the graft about 5 cm beyond the arterial anastomosis in an antegrade fashion under direct ultrasound guidance. With a micropuncture needle and micropuncture wire, sheath had been placed and upsized to a 6 [place] sheath. We were now able to take out the retrograde sheath after instilling the remainder of the graft with another 2 mg of tPA. The retrograde sheath was removed and mechanical rheolytic thrombectomy was performed with the AngioJet AVX catheter from the remainder of the graft into the central silicone portion.  At this point, there were some residual thrombus and narrowing and irregularity in the mid and distal portion of the graft. I elected to treat this area with a 7 mm diameter angioplasty balloon.  Several areas of narrowing were seen that resolved with angioplasty, 7 x 10 cm angioplasty balloon was inflated 3 times in the mid and distal graft. Completion angiogram following this showed a patent graft with markedly improved flow and minimal residual irregularity from the access site.   At this point, I elected to terminate the procedure. The second sheath was removed, 4-0 Monocryl pursestring suture was placed. Pressure was held. Sterile dressing was placed. The patient tolerated the procedure well and was taken to the recovery room in stable condition.   ____________________________ Algernon Huxley, MD jsd:ds D: 11/05/2013 17:29:52 ET T: 11/05/2013 18:42:00 ET JOB#: [id number]  cc: Algernon Huxley, MD, <Dictator> Algernon Huxley MD ELECTRONICALLY  SIGNED 11/26/2013 14:08

## 2014-08-09 NOTE — Discharge Summary (Signed)
PATIENT NAME:  [name] [name], [name] [name] MR#:  [id number] DATE OF BIRTH:  [birth year]  DATE OF ADMISSION:  11/12/2013 DATE OF DISCHARGE:  11/13/2013  PRESENTING COMPLAINT: Elevated potassium.   PRIMARY CARE PHYSICIAN: At [facility] in [place].   PRIMARY NEPHROLOGIST: In [place].   CONSULTATIONS:  1.  Dr. Delana Meyer, vascular.  2.  Dr. Candiss Norse, nephrology.   PROCEDURES:  1.  Insertion of right temporary dialysis catheter on 11/12/2013.  2.  Contrast injection right HeRO graft with mechanical thrombectomy using Trerotola device, right arm HeRO graft.   CODE STATUS: FULL.  DISCHARGE DIAGNOSES: 1.  Malfunction hemodialysis catheter status post right upper extremity shuntogram with thrombectomy.  2.  Hypertension.  3.  End-stage renal disease, on hemodialysis.  DISCHARGE MEDICATIONS: 1.  Aspirin 81 mg daily.  2.  Nephro-Vite 1 tablet daily.  3.  Plavix 75 mg daily at bedtime.  4.  Eliquis 2.5 mg 1 tablet b.i.d.; this was started by Dr. Delana Meyer after the procedure.  5.  Renagel 800 mg 3 tablets 3 times a day.  6.  Sensipar 60 mg p.o. at bedtime.  7.  Acetaminophen/hydrocodone 3/325 one tablet every 4 hours as needed.   DISCHARGE INSTRUCTIONS:  1.  Follow up with Dr. Delana Meyer in 2 to 4 weeks.  2.  Resume your hemodialysis as before on Tuesday, Thursday and Saturday.   BRIEF SUMMARY OF HOSPITAL COURSE: [name] is a 49 year old African American gentleman with past medical history of end-stage renal disease on hemodialysis, hypertension, and CHF, who was supposed to have declotting of his access, unable get it done yesterday, came in with hyperkalemia requiring urgent dialysis. The patient was admitted with:  1.  Acute hyperkalemia. He did not get dialysis for 2 times in a row, came in with potassium elevated. A temporary catheter was placed in the groin by Dr. Delana Meyer, underwent urgent hemodialysis, and potassium was stabilized. He did receive calcium gluconate, insulin and dextrose  along with Kayexalate.  2.  Malfunctioning hemodialysis access. The patient underwent right upper extremity shuntogram with thrombectomy by Dr. Delana Meyer, now on p.o. Eliquis. 3.  End-stage renal disease. Hemodialysis was resumed Tuesday, Thursday and Saturday as before.  4.  Hypertension. Not on any medications at this time.   Hospital stay otherwise remained stable. The patient remained a FULL code.  TIME SPENT: 40 minutes.  ____________________________ Hart Rochester Posey Pronto, MD sap:sb D: 11/15/2013 07:38:47 ET T: 11/15/2013 08:09:40 ET JOB#: [id number]  cc: Maydelin Deming A. Posey Pronto, MD, <Dictator> Katha Cabal, MD Murlean Iba, MD  Ilda Basset MD ELECTRONICALLY SIGNED 11/27/2013 11:34

## 2014-08-09 NOTE — Discharge Summary (Signed)
PATIENT NAME:  [name] [name], [name] [name] MR#:  [id number] DATE OF BIRTH:  1966/02/25  DATE OF ADMISSION:  05/15/2013 DATE OF DISCHARGE:  05/16/2013  PRESENTING COMPLAINT: Elevated potassium.   DISCHARGE DIAGNOSES:  1.  Hyperkalemia improved after hemodialysis x 2 in a row.  2.  End-stage renal disease on hemodialysis.  3.  Hypertension. The patient is off medications at this time.   CONSULTATIONS:  1.  Nephrology, Dr. Candiss Norse.  2.  Vascular, Dr. Delana Meyer and Dr. Lucky Cowboy.   PROCEDURES:  1.  Insertion of right common femoral vein temporary Trialysis catheter with ultrasound guidance by Dr. Delana Meyer on the 28th of January.  2.  PermCath tunneled dialysis catheter on the right external jugular vein by Dr. Lucky Cowboy on the 29th of January.   CONDITION ON DISCHARGE: Fair.   MEDICATIONS AT DISCHARGE:  1.  Nephro-Vite 1 p.o. daily.  2.  Sensipar 60 mg daily.  3.  Renagel 800 mg 2 tablets 3 times a day.  4.  Aspirin 325 mg p.o. daily.  5.  Plavix 75 mg daily.   DISCHARGE DIET: Renal diet.   He will receive hemodialysis as an outpatient with [facility] in [facility] as before.   BRIEF SUMMARY OF HOSPITAL COURSE:  1.  [name] is a 49 year old African American gentleman, who is on hemodialysis for the last several years and came to special procedures due to his graft declotting and was incidentally found to have a potassium of 6.7. The patient was admitted and was seen by nephrology. The patient underwent dialysis after right common femoral vein Trialysis catheter was placed by Dr. Delana Meyer. The patient received another round of dialysis the next day after he had a PermCath placed by Dr. Lucky Cowboy in the right external jugular vein. The patient remained asymptomatic and did not have any EKG changes. His potassium came down to 5.8. He received 1 dialysis after that and was okay from nephrology standpoint to be discharged and resume his outpatient dialysis as before.  2.  End-stage renal disease. The patient had dialysis x  2.  3.  Obesity.  4.  History of hypertension, not on any medication.  5.  History of cardiomyopathy. He is well compensated.  6.  Hospital stay otherwise remained stable.   CODE STATUS: The patient remained a full code.   TIME SPENT: 40 minutes.  ____________________________ Hart Rochester Posey Pronto, MD sap:aw D: 05/17/2013 07:19:12 ET T: 05/17/2013 07:29:21 ET JOB#: [id number]  cc: Percival Glasheen A. Posey Pronto, MD, <Dictator> Algernon Huxley, MD Katha Cabal, MD DR. [facility] Ilda Basset MD ELECTRONICALLY SIGNED 05/30/2013 13:17

## 2014-08-09 NOTE — Op Note (Signed)
PATIENT NAME:  [name] [name], [name] [name] MR#:  [id number] DATE OF BIRTH:  11/05/1965  DATE OF PROCEDURE:  05/16/2013  PREOPERATIVE DIAGNOSES: End-stage renal disease. Multiple failed previous dialysis accesses.  POSTOPERATIVE DIAGNOSIS: End-stage renal disease. Multiple failed previous dialysis accesses with bilateral internal jugular vein occlusions.   SURGEON: Algernon Huxley, M.D.   ANESTHESIA: Local with moderate conscious sedation.   ESTIMATED BLOOD LOSS: Approximately 25 mL.   PROCEDURE  PERFORMED:  1. Ultrasound guidance for vascular access to bilateral internal jugular veins.  2. Bilateral jugular venograms.  3. Ultrasound guidance for vascular access to right external jugular vein.  4. Fluoroscopic guidance for placement of catheter.  5. Placement of a 19 cm tip to cuff tunneled hemodialysis catheter through the right external jugular vein.   INDICATION FOR PROCEDURE: This is a 49 year old gentleman with long-standing end-stage renal disease. He has a right arm AV graft that has been declotted twice recently and has failed within 24 hours of each declot. He had a temporary catheter placed yesterday for hyperkalemia. He is brought down today for a PermCath placement and then planning of his permanent dialysis access as an outpatient.   DESCRIPTION OF PROCEDURE: The patient is brought to the vascular suite. Initially, his left neck and chest were sterilely prepped and draped and a sterile surgical field was created. The left jugular vein was visualized in the neck and was patent in the mid neck. However, after needle access the wire would not cross easily. We placed a micropuncture sheath and ultimately a 5 French sheath. His left jugular vein occluded at the collar bone and drained through azygous collaterals. I attempted to cross the occlusion without success for several minutes. This was clearly not going to provide our access for a dialysis catheter today so I abandoned this after several minutes of  trying to cross the occlusion. I then accessed the right internal jugular vein. This occluded in the lower part of the neck after needle access. A micropuncture sheath was placed but I could not cross the internal jugular occlusion. Through this jugular venogram I was able to see the very large external jugular vein and an anterior jugular vein that drained into the central venous circulation. Using both ultrasound and fluoroscopic guidance, I then accessed the external jugular vein just above the collar bone and passed a micropuncture wire and sheath and this passed easily. Imaging through the micropuncture sheath in the superior vena cava showed this to be patent and has good access. I then placed a long wire into the inferior vena cava and after sequential dilatation placed the peel-away sheath over the wire and the wire and dilator were removed.  A counterincision was made in the right chest, and I tunneled from the right chest incision to the access site. A 19 cm tipped cuff tunneled hemodialysis dialysis catheter was placed, tunneled from the chest incision to the access incision, placed through the peel-away sheath and the peel-away sheath was removed. The catheter tips were parked in excellent location in the right atrium and the catheter had no kink. The appropriate distal connectors were placed. It withdrew blood well and flushed easily with heparinized saline. A concentrated heparin solution was placed. It was secured to the chest wall with 2 Prolene sutures. A 4-0 Monocryl was used to close the access incision. A 4-0 Monocryl pursestring suture was placed around the catheter exit site. A sterile dressing was placed. The patient tolerated the procedure well and was taken the recovery room in stable  condition.   ____________________________ Algernon Huxley, MD jsd:sg D: 05/16/2013 11:50:58 ET T: 05/16/2013 12:27:30 ET JOB#: [id number]  cc: Algernon Huxley, MD, <Dictator> Algernon Huxley MD ELECTRONICALLY  SIGNED 05/21/2013 11:06

## 2014-08-09 NOTE — H&P (Signed)
PATIENT NAME:  [name] [name], [name] [name] MR#:  [id number] DATE OF BIRTH:  12/04/1965  DATE OF ADMISSION:  11/12/2013  ADMITTING PHYSICIAN: Gladstone Lighter, MD.  PRIMARY CARE PHYSICIAN: At [facility].  PRIMARY NEPHROLOGIST:  In [place].   CHIEF COMPLAINT: Elevated potassium.   HISTORY OF PRESENT ILLNESS:  1.  Mr. Bessey is a 49 year old pleasant African American male with past medical history significant for end-stage renal disease on hemodialysis for almost 13 years now, history of hypertension, currently not on any medications and well controlled with just dialysis.  2.  History of congestive heart failure at and varicose veins, presents to the hospital for declotting AV graft today.  The patient has had trouble with clotting of his dialysis access in the past. His last dialysis was last Thursday, which was 11/07/2013.  He got dialyzed at the time on Saturday morning.  His access was clotted off.  He did not get dialyzed.  He is scheduled for declotting access today and labs show that his potassium is elevated at 6.8, so he has a temporary dialysis catheter placed today for dialysis they will attempt declotting his access tomorrow. The patient denies any chest pain, palpitations, no recent weakness sickness. No recent travel. No nausea, vomiting, diarrhea, or fevers.   PAST MEDICAL HISTORY: 1.  End-stage renal disease on Tuesday, Thursday, Saturday hemodialysis.  2.  Congestive heart failure.  3.  History of hypertension, not currently on any medications.   PAST SURGICAL HISTORY: 1.  Hernia repair.  2.  Multiple laxatives for dialysis placement and declotting of those accesses in the past.   ALLERGIES: No known drug allergies.   CURRENT HOME MEDICATIONS:  1.  Nephro-Vite 3 tablets with meals 3 times a day and 2 tablets with snacks.  2.  Sensipar 1 tablet p.o. daily.  3.  Renagel 3 with meals and 2 with snacks.  4.  Aspirin 81 mg p.o. daily.  5.  Plavix 75 mg p.o. daily.  7.   Sublingual nitroglycerin p.r.n. for chest pain.   SOCIAL HISTORY: Lives at home with his wife, active at baseline. No smoking, alcohol or drug abuse.   FAMILY HISTORY: Significant for hypertension, coronary artery disease in both parents and brother with end-stage renal disease on hemodialysis.   REVIEW OF SYSTEMS:  CONSTITUTIONAL: No fever, fatigue or weakness.  EYES: No blurred vision, double vision, inflammation or glaucoma.  ENT: No tinnitus, ear pain, hearing loss, epistaxis or discharge.  RESPIRATORY: No cough, wheeze, hemoptysis or COPD.  CARDIOVASCULAR: No chest pain, orthopnea, edema, arrhythmia, palpitations, or syncope. GASTROINTESTINAL:  No nausea, vomiting, diarrhea, abdominal pain, hematemesis, or melena.  GENITOURINARY: The patient is anuric.  No hematuria or dysuria.  ENDOCRINE: No polyuria, nocturia, thyroid problems, heat or cold intolerance.  HEMATOLOGY: No anemia, easy bruising or bleeding.  SKIN: No acne, rash or lesions.  MUSCULOSKELETAL: No neck fracture, pain, arthritis or gout.  NEUROLOGIC: No numbness, weakness, CVA, TIA or seizures.  PSYCHIATRIC:  No anxiety, insomnia, depression.   PHYSICAL EXAMINATION: VITAL SIGNS: Temperature 98 degrees Fahrenheit, pulse 89, respirations 20, blood pressure 127/84, pulse oximetry of 95% on room air.  GENERAL: Well-built, well-nourished male lying in bed, not in any acute distress.  HEENT: Normocephalic, atraumatic. Pupils equal, round, reacting to light. Anicteric sclerae. Extraocular movements intact.  OROPHARYNX: Clear without any erythema, mass or exudates.  NECK: Supple. No thyromegaly, JVD or carotid bruits. No lymphadenopathy.  LUNGS: Moving air bilaterally. No wheeze or crackles. No use of accessory muscles for  breathing.   CARDIOVASCULAR: S1, S2, regular rate and rhythm. No murmurs, rubs, or gallops.  ABDOMEN: Soft, nontender, nondistended. No hepatosplenomegaly. Normal bowel sounds.  EXTREMITIES: No pedal edema. No  clubbing or cyanosis, 2+ dorsalis pedis pulses palpable bilaterally.  SKIN: No acne, rash or lesions.  LYMPHATICS: No cervical or inguinal lymphadenopathy.  NEUROLOGICAL: Cranial nerves intact. No focal motor or sensory deficits.  PSYCHOLOGICAL: The patient is awake, alert, oriented x3.   LABORATORY DATA: Just the potassium draw done today is 6.4.    EKG is pending at this time.   ASSESSMENT AND PLAN:  This is a 49 year old male with known history of end-stage renal disease on hemodialysis, congestive heart failure, hypertension, being admitted for elevated potassium and due for dialysis today.  1.  Hyperkalemia, last dialysis was almost 5 days ago.  Did not get dialyzed on Saturday and today due to access issues, so potassium is elevated, so temporary dialysis catheter has been placed and nephrology consulted for the same. He will get dialyzed today. Also given calcium gluconate, insulin and dextrose and Kayexalate. EKG is pending.  2. End-stage renal disease on Tuesday, Thursday, Saturday hemodialysis, currently has a temporary dialysis catheter, which will be used. Nephrology has been consulted and vascular consult for declotting his access in the a.m. The patient will be n.p.o. after midnight for the same. Continue his renal supplements.  3.  Hypertension. Well controlled after dialysis was started, currently not on any medications.  4. Deep vein prophylaxis. Subcutaneous heparin.   CODE STATUS: Full code.   TIME SPENT ON ADMISSION: 50 minutes.    ____________________________ Gladstone Lighter, MD rk:ds D: 11/12/2013 17:31:22 ET T: 11/12/2013 17:47:58 ET JOB#: [id number]  cc: Gladstone Lighter, MD, <Dictator> [place] MD ELECTRONICALLY SIGNED 11/13/2013 15:14

## 2014-08-09 NOTE — Op Note (Signed)
PATIENT NAME:  [name] [name], [name] [name] MR#:  [id number] DATE OF BIRTH:  1965/09/21  DATE OF PROCEDURE:  11/12/2013  PREOPERATIVE DIAGNOSES:  1.  Hyperkalemia.  2.  End-stage renal disease requiring hemodialysis.  3.  Complication dialysis access with thrombosis of right arm HeRO graft.   POSTOPERATIVE DIAGNOSIS:  1.  Hyperkalemia.  2.  End-stage renal disease requiring hemodialysis.  3.  Complication dialysis access with thrombosis of right arm HeRO graft.   PROCEDURE PERFORMED: Insertion of right femoral temporary dialysis catheter.   SURGEON: Katha Cabal, M.D.   DESCRIPTION OF PROCEDURE: The patient is positioned supine in the preoperative area of special procedures and his right groin is prepped and draped in sterile fashion. Ultrasound is placed in a sterile sleeve. Femoral vein is identified. It is echolucent and compressible indicating patency. Images recorded for the permanent record. Under real-time visualization, after 1% lidocaine has been infiltrated, access to the femoral vein is obtained with a Microneedle, Microwire followed a stiffened Micropuncture kit. The Amplatz wire is then advanced through the stiffened Micropuncture sheath and dilator is passed over the wire.  A 30-cm temporary Trialysis catheter is then advanced without difficulty. All 3 lumens aspirate and flush easily. The catheter is secured to the skin of the thigh with 2-0 nylon and a sterile dressing is applied. The patient tolerated the procedure well and there were no immediate complications.    ____________________________ Katha Cabal, MD ggs:lt D: 11/13/2013 11:04:20 ET T: 11/13/2013 12:39:22 ET JOB#: [id number]  cc: Katha Cabal, MD, <Dictator> Katha Cabal MD ELECTRONICALLY SIGNED 11/26/2013 17:16

## 2014-08-09 NOTE — Op Note (Signed)
PATIENT NAME:  [name] [name], [name] [name] MR#:  [id number] DATE OF BIRTH:  May 12, 1965  DATE OF PROCEDURE:  06/05/2013  DATE OF DICTATION: 06/05/2013   PREOPERATIVE DIAGNOSES:  1. End-stage renal disease.  2. Multiple failed previous dialysis accesses.   POSTOPERATIVE DIAGNOSES:  1. End-stage renal disease.  2. Multiple failed previous dialysis accesses.   PROCEDURES: 1. Ultrasound guidance for vascular access, right femoral vein.  2. Placement of 42 cm tip to cuff tunneled hemodialysis catheter, right femoral vein.  3. Removal of right jugular PermCath.  4. Placement of right arm HeRO graft from right brachial artery with central portion going through right jugular vein.   SURGEON: Algernon Huxley, MD   ANESTHESIA: General.   BLOOD LOSS: 50 mL.   FLUOROSCOPY TIME: 2 minutes.  CONTRAST USD: 5 mL.   INDICATION FOR PROCEDURE: This is a 49 year old African-American male with end-stage renal disease. He has had multiple failed previous accesses. He has got a right arm AV graft which has recently thrombosed and is unable to be kept open. He had a PermCath placed on the right. His left jugular was occluded. He is brought in today for HeRO graft on the right, and we will need to place a PermCath in the groin for temporary use while his HeRO graft is healing. Risks and benefits were discussed. Informed consent was obtained.   DESCRIPTION OF THE PROCEDURE: The patient was brought to the operative suite, and after an adequate level of general anesthesia was obtained, the right groin was sterilely prepped and draped and a sterile surgical field was created. The right femoral vein was visualized with ultrasound and found to be widely patent. It was then accessed under direct ultrasound guidance without difficulty with a Seldinger needle. A J-wire was then placed. After skin nick and dilatation, the peel-away sheath was placed over the wire. A counterincision was made several centimeters inferior and lateral in the  right leg, and I tunneled from this counterincision to the access site. A 42 cm tip to cuff tunneled hemodialysis catheter was placed through the peel-away sheath, and the peel-away sheath was removed. The catheter tip was parked in good location at about T12, without kink, and it withdrew blood well and flushed easily with heparinized saline, and a concentrated heparin solution was placed. It was secured to the leg with 2 Prolene sutures. A 4-0 Monocryl suture was used to close the access site, and a 4-0 Monocryl pursestring was placed around the exit site.   I then turned my attention to the HeRO graft. A small cutdown was made in the neck overlying the existing catheter. This was dissected out, transected. The distal portion was removed. The intravascular portion was rewired, and it was then removed over an Amplatz Super Stiff wire. The peel-away sheath was then placed over the wire, and the silicone central portion of the HeRO graft was then placed through the peel-away sheath over the wire, and the wire and dilator were removed. It was parked in the right atrium. It was then brought out in a counterincision at the shoulder and clamped. Fluoroscopic guidance was used to help this, and a single-shot venogram was performed, which showed the superior vena cava to be widely patent. I dissected out the brachial artery. An incision in the mid to distal upper arm above his previous arterial incisions was made. The brachial artery was dissected out and encircled with vessel loops proximally and distally. We used the most curved tunneler with a small counterincision in the  mid to proximal upper arm and tunneled from the shoulder, connected the PTFE portion with the grommet to the silicone portion using fluoroscopic guidance to leave the catheter tip in the right atrium. I then pulled the graft taut and heparinized the patient. The anastomosis was created with a running CV-6 suture in the usual fashion. The vessel was  flushed and de-aired prior to release of control. On release, there was an excellent flow through the HeRO graft and palpable pulse in the brachial artery distal to the anastomosis. At this point, the wound was irrigated. Surgicel and Evicel topical hemostatic agents were placed. Hemostasis was complete. All wounds were closed with 3-0 Vicryl and 4-0 Monocryl, and Dermabond was placed as a dressing. A dry dressing was placed over the previous catheter exit site. The patient then was awakened from anesthesia and taken to the recovery room in stable condition, having tolerated the procedure well.   ____________________________ Algernon Huxley, MD jsd:lb D: 06/05/2013 12:58:41 ET T: 06/05/2013 13:24:35 ET JOB#: [id number]  cc: Algernon Huxley, MD, <Dictator> Algernon Huxley MD ELECTRONICALLY SIGNED 06/13/2013 12:00

## 2014-08-09 NOTE — Op Note (Signed)
PATIENT NAME:  [name] [name], [name] [name] MR#:  [id number] DATE OF BIRTH:  08-Dec-1965  DATE OF PROCEDURE:  05/15/2013  PREOPERATIVE DIAGNOSES:  1.  Hyperkalemia.  2.  End-stage renal disease requiring hemodialysis.  3.  Complication: Arteriovenous dialysis device with multiple thromboses.   POSTOPERATIVE DIAGNOSES:  1.  Hyperkalemia.  2.  End-stage renal disease requiring hemodialysis.  3.  Complication: Arteriovenous dialysis device with multiple thromboses.   PROCEDURE PERFORMED: Insertion of a right common femoral vein temporary Trialysis catheter with ultrasound guidance.   SURGEON: Katha Cabal, MD  DESCRIPTION OF PROCEDURE: The patient is in the preoperative holding area. He is positioned supine. Ultrasound is placed in a sterile sleeve. Right groin is prepped and draped in sterile fashion. The common femoral vein is identified. It is echolucent and compressible indicating patency. Image is recorded for the permanent record. Lidocaine 1% is infiltrated into the soft tissue. Lidocaine is infiltrated into the skin. A micropuncture needle is then inserted into the common femoral vein, microwire followed by microsheath. J-wire is then advanced followed by the dilator and subsequently the Trialysis catheter. All 3 lumens aspirate and flush easily and the catheter is secured to the skin of the thigh with 2-0 nylon. Sterile dressing is applied. The patient tolerated the procedure well. There were no immediate complications.    ____________________________ Katha Cabal, MD ggs:np D: 05/15/2013 17:02:11 ET T: 05/15/2013 19:48:34 ET JOB#: [id number]  cc: Katha Cabal, MD, <Dictator> Katha Cabal MD ELECTRONICALLY SIGNED 05/30/2013 17:10

## 2014-08-09 NOTE — Op Note (Signed)
PATIENT NAME:  [name] [name], [name] [name] MR#:  [id number] DATE OF BIRTH:  02/01/1966  DATE OF PROCEDURE:  11/13/2013  PREOPERATIVE DIAGNOSES:  1.  Thrombosis, right arm HeRo graft.  2.  End-stage renal disease requiring hemodialysis.  3.  Superior vena cava syndrome.  4.  Morbid obesity.   POSTOPERATIVE DIAGNOSES: 1.  Thrombosis, right arm HeRo graft.  2.  End-stage renal disease requiring hemodialysis.  3.  Superior vena cava syndrome.  4.  Morbid obesity.   PROCEDURES PERFORMED:  1.  Contrast injection, right arm HeRo graft.  2.  Mechanical thrombectomy using Trerotola device, right arm hero graft.   SURGEON: Hortencia Pilar, MD.   SEDATION: Versed 4 mg plus fentanyl 150 mcg administered IV. Continuous ECG, pulse oximetry and cardiopulmonary monitoring is performed throughout the entire procedure by the interventional radiology nurse. Total sedation time was 1 hour.   ACCESS:  1.  A 6 French sheath, right arm HeRo graft, antegrade direction.  2.  A 6 French sheath, retrograde direction, right arm HeRo graft.  CONTRAST USED: Isovue 20 mL.   FLUOROSCOPY TIME: 2.9 minutes.   INDICATIONS: [name] [name] is a 49 year old gentleman who presented yesterday with thrombosis of his HeRo graft. Unfortunately, he was noted to be significantly hyperkalemic with a serum potassium of approximately 6.5. Risks and benefits for insertion of a temporary catheter are reviewed and he did undergo this procedure without incident, and subsequently has been dialyzed this morning.  His potassium was 4.5.   The risks and benefits for thrombectomy are reviewed and the patient has agreed to proceed.   DESCRIPTION OF PROCEDURE: The patient is taken to special procedures and placed in the supine position. After adequate sedation is achieved, he is positioned supine with his right arm extended palm upward. Right arm is prepped and draped in a sterile fashion. Appropriate timeout is called.   Lidocaine 1% is infiltrated in  the soft tissues overlying the palpable graft near the arterial anastomosis and, an antegrade direction, Microneedle is inserted, microwire followed by micro sheath, J-wire followed by a 6-French sheath. Sheath does not aspirate blood. A small hand injection of contrast demonstrates thrombus within the graft with no forward flow. Heparin 4000 units are given.   The graft is then inspected under fluoroscopy and found to be in continuity. The intravascular silicon portion is in good position with the tip in the mid atrium. There are no kinks, fractures, or other structural abnormalities identified.   Trerotola device is then advanced up to the end of the silicon portion.  It is then opened, but the basket is not engaged. It is then gently retracted back into the PTFE portion, where the basket is engaged and multiple passes are made. After flushing a small amount of heparinized saline, imaging demonstrates that the majority of the thrombus has been removed.   Lidocaine 1% is infiltrated in the soft tissues overlying the graft toward the deltoid level and access is obtained to the graft with a micropuncture needle in a retrograde direction. Microwire followed by micro sheath, J-wire followed by a 6-French sheath. Trerotola device is then used and advanced through the sheath down, it is advanced into the brachial artery where, again, the basket is opened, but not engaged. Is then gently retracted back into the graft itself pulling whatever thrombus is noted at the anastomotic level, and then engaged and several passes are made through the PTFE portion from the level of the anastomosis to the sheath. Another 2 passes are made  in the antegrade direction and, subsequently, the Trerotola is removed.   Imaging in the forward flow demonstrates the graft is now completely free of thrombus and there is excellent forward flow with compression and a retrograde injection, there appears to be freedom of thrombus from the  arterial portion, visualized portions of the brachial artery, as well as the anastomosis are widely patent.   Both sheaths are then removed with pursestring sutures of 4-0 Monocryl, light pressure is applied and sterile dressings. There are no immediate complications.   INTERPRETATION: Initial views demonstrate thrombus, but otherwise, the HeRo graft is intact and in good position. Following thrombectomy, there no structural abnormalities noted. Because this is the second time in only a month or so that this is thrombosed and given the very long length of the HeRo graft because of the patient's size and multiple previous grafts, I will initiate the patient on Eliquis for anticoagulation in hopes that this will allow for freedom of thrombosis.    ____________________________ Katha Cabal, MD ggs:ts D: 11/13/2013 11:02:44 ET T: 11/13/2013 12:13:24 ET JOB#: [id number]  cc: Katha Cabal, MD, <Dictator> Murlean Iba, MD Katha Cabal MD ELECTRONICALLY SIGNED 11/26/2013 17:16

## 2014-08-11 ENCOUNTER — Ambulatory Visit
Admit: 2014-08-11 | Disposition: A | Discharge status: Home / Self Care | Payer: Self-pay | Attending: Vascular Surgery | Admitting: Vascular Surgery

## 2014-08-11 DIAGNOSIS — I120 Hypertensive chronic kidney disease with stage 5 chronic kidney disease or end stage renal disease: Secondary | ICD-10-CM | POA: Diagnosis not present

## 2014-08-11 DIAGNOSIS — E669 Obesity, unspecified: Secondary | ICD-10-CM | POA: Diagnosis not present

## 2014-08-11 DIAGNOSIS — T82868A Thrombosis of vascular prosthetic devices, implants and grafts, initial encounter: Secondary | ICD-10-CM | POA: Diagnosis not present

## 2014-08-11 DIAGNOSIS — T82511S Breakdown (mechanical) of surgically created arteriovenous shunt, sequela: Secondary | ICD-10-CM | POA: Diagnosis not present

## 2014-08-11 DIAGNOSIS — N186 End stage renal disease: Secondary | ICD-10-CM | POA: Diagnosis not present

## 2014-08-11 DIAGNOSIS — I10 Essential (primary) hypertension: Secondary | ICD-10-CM | POA: Diagnosis not present

## 2014-08-11 DIAGNOSIS — Z992 Dependence on renal dialysis: Secondary | ICD-10-CM | POA: Diagnosis not present

## 2014-08-12 DIAGNOSIS — Z992 Dependence on renal dialysis: Secondary | ICD-10-CM | POA: Diagnosis not present

## 2014-08-12 DIAGNOSIS — N186 End stage renal disease: Secondary | ICD-10-CM | POA: Diagnosis not present

## 2014-08-12 DIAGNOSIS — D509 Iron deficiency anemia, unspecified: Secondary | ICD-10-CM | POA: Diagnosis not present

## 2014-08-12 DIAGNOSIS — N2581 Secondary hyperparathyroidism of renal origin: Secondary | ICD-10-CM | POA: Diagnosis not present

## 2014-08-13 NOTE — Op Note (Signed)
PATIENT NAME:  [name] [name], [name] [name] MR#:  [id number] DATE OF BIRTH:  06/27/1965  DATE OF PROCEDURE:  04/04/2014  PREOPERATIVE DIAGNOSES: 1. Thrombosis of right arm Hemodialysis Reliable Outflow graft.  2. End-stage renal disease requiring hemodialysis.  3. Superior vena cava syndrome.  4. Hypertension. 5. Morbid obesity.   POSTOPERATIVE DIAGNOSES:  1. Thrombosis of right arm Hemodialysis Reliable Outflow graft.  2. End-stage renal disease requiring hemodialysis.  3. Superior vena cava syndrome.  4. Hypertension. 5. Morbid obesity.   PROCEDURES PERFORMED: 1. Contrast injection, right arm HeRO graft.  2. Mechanical thrombectomy, right arm HeRO graft with Trerotola device.  3. Percutaneous transluminal angioplasty to 6 mm, arterial anastomosis.  4. Percutaneous transluminal angioplasty and stent placement, venous portion, right arm HeRO graft.   PROCEDURE PERFORMED BY: Katha Cabal, M.D.   SEDATION Versed plus fentanyl. Continuous ECG, pulse oximetry, and cardiopulmonary monitoring is performed throughout the entire procedure by the interventional radiology nurse. Total sedation time was 1 hour 40 minutes.   ACCESS:  1.  A 7 French sheath, antegrade direction, right arm HeRO graft.  2.  A 6 French sheath, retrograde direction, right arm HeRO graft.   CONTRAST USED: Isovue 48 mL.   FLUOROSCOPY TIME: 9.7 minutes.   INDICATIONS: [name] [name] is a 49 year old gentleman who presented to the dialysis center and found to have thrombosis of the access. Risks and benefits are reviewed. All questions answered. The patient agrees to proceed with thrombectomy and intervention.   DESCRIPTION OF PROCEDURE: The patient is taken to the special procedure suite, placed in the supine position. After adequate sedation is achieved, he is positioned supine with his  right arm extended palm upward. The right arm is prepped and draped in sterile fashion. Appropriate timeout is called.   Lidocaine 1%  is  infiltrated in the soft tissues overlying the graft. Near the arterial anastomosis and in an antegrade direction, a micropuncture needle is inserted, microwire followed by micro sheath, J-wire followed by a 6 French sheath. A small amount of contrast is then injected, demonstrating thrombus within the graft and 4000 units of heparin is given. Graft is then scanned under fluoroscopy and found to be in good position, free of kinks or other mechanical abnormalities.   Trerotola device is then opened onto the field and multiple passes are made through the graft portion. The Trerotola is extended all the way into the atrium. The basket is opened. It is not engaged and is then gently pulled back through the silicon portion, and then engaged as the basket enters the PTFE portion. After several passes, a small amount of contrast is injected and demonstrates there are still several areas of abnormality, some of which appear to be strictures and some of which appear to be some residual thrombus; however, the majority has now been cleared, and there is contrast extending now, from the sheath to the atrium, through the entire system.   Lidocaine 1%  is infiltrated in the soft tissues overlying the graft, and a micropuncture needle is inserted in a retrograde direction. Microwire followed by micro sheath, J-wire followed by a 6 French sheath. Trerotola device is then advanced through the sheath, extends out into the brachial artery. The brachial artery is identified, as it is fairly extensively calcified. Also, review of previous images demonstrate the anatomy, as well. The basket, once again, is opened within the brachial artery. It is gently pulled back into the graft, and then engaged, several passes are made. A floppy Glidewire is  now extended through the retrograde sheath into the brachial artery, and a Kumpe catheter is advanced and positioned in the brachial artery. Subsequently, injection demonstrates there is a  stricture stenosis at the origin of the graft, as well as in the vicinity of the antegrade sheath.   The wire is then exchanged through the Kumpe catheter for Magic Torque, and a 6 mm x 2 cm balloon is then advanced over the wire and used to angioplasty the arterial anastomosis, extending several millimeters into the brachial artery. Three serial angioplasties are performed, each to 10 atmospheres for approximately a 30-60 seconds. The Kumpe is then reintroduced and there is noted to be complete resolution with 0%  residual stenosis throughout the arterial portion.   Through the antegrade sheath, the venous portion is now interrogated. There are clearly abnormalities throughout the area that is being accessed, both for the arterial and venous access. There appears to be some residual thrombus, but also extensive scarring and narrowing. An 8 x 6 Dorado balloon is then used to angioplasty this area. Two serial angioplasties were required, each to 14 atmospheres for approximately 1 minute. Imaging following this demonstrates there is still extensive residual irregularity and thrombus and some areas of stricture that have not responded well. It is, therefore, elected to place a fluency stent across this region. A total of two 8 x 60 fluency stents are placed and then an 8 x 40. These are overlapped with the existing stent by approximately 1 cm, as well as each stent overlapped by approximately 1 cm. The stents are then fully expanded with the 8 mm Dorado balloon with short inflations to 14-16 atmospheres.   Follow-up imaging now demonstrates there is rapid flow through the system. There is no evidence of a residual thrombus or narrowing. The entire system is then imaged, demonstrating rapid flow of contrast into the atrium.   Pursestring sutures are placed around the sheath, and the sheath is removed. After placing the fluency stents, the retrograde sheath was removed and a pursestring suture placed. And then,  the area was fully expanded with the [facility] balloon. Light pressure was held, and there are no immediate complications.   INTERPRETATION: Initial views demonstrate the HeRO graft appears to be in good condition, free of mechanical abnormalities or defects. Tip is within the atrium, but there is thrombus noted on initial hand injection throughout the graft.   Following mechanical thrombectomy, there is elimination of the majority of the thrombus, and there is flow established. Two areas of problems are noted. The arterial anastomosis has a narrowing. This is treated with a 6 mm balloon inflation, as described, with excellent result. The midportion, where the patient actually undergoes accessing, is also markedly abnormal. This did not respond to angioplasty; and therefore, fluency stents are placed and expanded to 8 mm with an excellent result.   SUMMARY: Successful salvage of right arm HeRO graft.    ____________________________ Katha Cabal, MD ggs:mw D: 04/04/2014 10:28:50 ET T: 04/04/2014 14:34:14 ET JOB#: [id number]  cc: Katha Cabal, MD, <Dictator> Katha Cabal MD ELECTRONICALLY SIGNED 04/29/2014 17:30

## 2014-08-14 DIAGNOSIS — N186 End stage renal disease: Secondary | ICD-10-CM | POA: Diagnosis not present

## 2014-08-14 DIAGNOSIS — Z992 Dependence on renal dialysis: Secondary | ICD-10-CM | POA: Diagnosis not present

## 2014-08-14 DIAGNOSIS — N2581 Secondary hyperparathyroidism of renal origin: Secondary | ICD-10-CM | POA: Diagnosis not present

## 2014-08-14 DIAGNOSIS — D509 Iron deficiency anemia, unspecified: Secondary | ICD-10-CM | POA: Diagnosis not present

## 2014-08-16 DIAGNOSIS — N2581 Secondary hyperparathyroidism of renal origin: Secondary | ICD-10-CM | POA: Diagnosis not present

## 2014-08-16 DIAGNOSIS — Z992 Dependence on renal dialysis: Secondary | ICD-10-CM | POA: Diagnosis not present

## 2014-08-16 DIAGNOSIS — N186 End stage renal disease: Secondary | ICD-10-CM | POA: Diagnosis not present

## 2014-08-16 DIAGNOSIS — D509 Iron deficiency anemia, unspecified: Secondary | ICD-10-CM | POA: Diagnosis not present

## 2014-08-17 NOTE — Op Note (Signed)
PATIENT NAME:  [name] [name], [name] [name] MR#:  [id number] DATE OF BIRTH:  08-Sep-1965  DATE OF PROCEDURE:  05/12/2014  PREOPERATIVE DIAGNOSES:  1.  End-stage renal disease.  2.  Thrombosed right arm HeRO graft.   POSTOPERATIVE DIAGNOSES:  1.  End-stage renal disease.  2.  Thrombosed right arm HeRO graft.   PROCEDURES:  1.  Ultrasound guidance for vascular access in both an antegrade and retrograde fashion.  2.  Right upper extremity shuntogram and central venogram.  3.  Catheter directed thrombolysis with 4 mg of tPA to the HeRO graft.  4.  Mechanical rheolytic thrombectomy with the AngioJet AVX catheter to the HeRO graft.  5.  Fogarty embolectomy for arterial plug.  6.  Percutaneous transluminal angioplasty with 7 and 8 mm diameter angioplasty balloon to mid graft for residual thrombosis causing stenosis.   SURGEON: Algernon Huxley, MD.   ANESTHESIA: Local with moderate conscious sedation.   ESTIMATED BLOOD LOSS: 25 mL.   INDICATION FOR PROCEDURE: This is a 49 year old gentleman with end-stage renal disease. He has an access that has clotted again and we are attempting to salvage this. Risks and benefits were discussed. Informed consent was obtained.   DESCRIPTION OF PROCEDURE: The patient was brought to the vascular suite. The right upper extremity was sterilely prepped and draped and a sterile surgical field was created.  I accessed the graft in both an antegrade and retrograde fashion, crossing under direct ultrasound guidance due to the pulseless nature of the graft with a micropuncture needle and permanent image was recorded. Micropuncture wire and sheath were placed and upsized to a 6 French sheath and gave the patient 4000 units of intravenous heparin. A Kumpe catheter and Magic Torque were used to advance in the brachial artery. Imaging was performed which showed thrombosis of the graft.  A wire was passed into the central venous circulation from the antegrade sheath. 4 mg of tPA were then  instilled from the arterial anastomosis to the end of the PTFE portion of the HeRO graft. This was allowed to dwell for approximately 10 minutes. Mechanical rheolytic thrombectomy was then performed throughout the graft from the arterial anastomosis throughout the graft's PTFE portion. There was some residual arterial plug that was treated with a Fogarty embolectomy balloon, this was a 5 Fogarty over-the-wire embolectomy balloon. This cleared the arterial side, but there was still significant thrombosis within the mid and distal PTFE portions of the graft. I removed the retrograde sheath. I treated this lesion initially with a 7 mm diameter angioplasty balloon with suboptimal result. I ran the mechanical rheolytic thrombectomy device again and then with residual thrombus upsized to an 8 mm diameter angioplasty balloon in the mid and distal graft. Completion angiogram following this showed brisk flow through the graft without significant residual thrombus and I elected to terminate the procedure. The sheath was removed. 4-0 Monocryl pursestring suture was placed. Pressure was held. Sterile dressing was placed. The patient tolerated the procedure well and was taken to the recovery room in stable condition.     ____________________________ Algernon Huxley, MD jsd:bu D: 05/12/2014 16:05:01 ET T: 05/12/2014 16:20:38 ET JOB#: [id number]  cc: Algernon Huxley, MD, <Dictator> Algernon Huxley MD ELECTRONICALLY SIGNED 05/21/2014 11:53

## 2014-08-17 NOTE — Op Note (Signed)
PATIENT NAME:  [name] [name], [name] [name] MR#:  [id number] DATE OF BIRTH:  08-26-1965  DATE OF PROCEDURE:  08/11/2014  PREOPERATIVE DIAGNOSES:  1.  End-stage renal disease.  2.  Clotted right arm arteriovenous graft.  3.  Hypertension.   POSTOPERATIVE DIAGNOSES:  1.  End-stage renal disease.  2.  Clotted right arm arteriovenous graft.  3.  Hypertension.   PROCEDURES:  1.  Ultrasound guidance for vascular access in both an antegrade and retrograde fashion to the right arm HeRO graft.  2.  Right upper extremity shuntogram and central venogram.  3.  Catheter-directed thrombolysis with 4 mg of t-PA delivered to the right arm HeRO graft with the AngioJet AVX catheter in a power pulse spray fashion.  4.  Mechanical rheolytic thrombectomy to the HeRO graft with the AngioJet AVX catheter.  5.  Fogarty embolectomy for arterial plug.  6.  Percutaneous transluminal angioplasty of HeRO graft to artery anastomosis with 7 mm diameter angioplasty balloon.  7.  Percutaneous transluminal angioplasty of midportion of the graft for residual thrombus and stenosis after above procedures with 7 mm diameter angioplasty balloon.   SURGEON: Algernon Huxley, MD   ANESTHESIA: Local with moderate conscious sedation.   ESTIMATED BLOOD LOSS: 25 mL.   FLUOROSCOPY TIME: Six minutes.  CONTRAST USED: 30 mL.   INDICATION FOR PROCEDURE: This is a 49 year old gentleman with a long-standing history of end-stage renal disease. We have performed multiple previous procedures to maintain his dialysis access. His last declot was right around 3 months ago or a little more. He has been seen in the office and had no obvious issues, but he presented today with a clotted graft from his dialysis center. We are attempting to salvage this. Risks and benefits were discussed. Informed consent was obtained.   DESCRIPTION OF THE PROCEDURE: The patient is brought to the vascular suite. Right upper extremity was sterilely prepped and draped and a sterile  surgical field was created. Due to the pulseless nature of the graft, we accessed this in both an antegrade and retrograde fashion crossing with a micropuncture needle under direct ultrasound guidance and permanent image was recorded. A micropuncture wire and sheaths were placed, and we upsized to a 6-French sheath and gave the patient 4000 units of intravenous heparin. Imaging showed thrombosis of the graft. A Magic Torque wire was placed in the brachial artery from the retrograde sheath and into the central portion of the HeRO graft into the antegrade sheath, and 4 mg of t-PA were delivered to the HeRO graft in a power pulse spray fashion with the AngioJet AVX catheter. This was allowed to dwell for approximately 10 minutes. A Fogarty embolectomy was used to help pull the arterial plug with limited success and still stagnant flow through the graft due to residual arterial plug. I then selected a 7 mm diameter angioplasty balloon and inflated this at the arterial anastomosis to the HeRO graft. A tight waist was seen, which resolved at about 16 atmospheres and then the inflation was held for about 45 seconds. This resulted in clearing of the initial portion of the graft and the arterial anastomosis. There was still residual thrombus in the graft. I then performed mechanical rheolytic thrombectomy with the AngioJet AVX catheter throughout the graft with partial resolution of thrombus. The midportion of the graft in the access site still had residual thrombus and irregularity within the old stents. At this point, I could remove the retrograde sheath, as we had cleared the arterial portion. Through the  antegrade sheath I used a 7 mm diameter angioplasty balloon and ballooned the midportion of the HeRO graft in the upper arm. Again, waists were seen which resolved at about 12 to 14 atmospheres, and completion angiogram following this showed brisk flow through the graft. There was now a clinical thrill in the graft. The  second sheath was removed; a 4-0 Monocryl pursestring suture was placed. Pressure was held. Sterile dressing was placed. The patient tolerated the procedure well and was taken to the recovery room in stable condition.    ____________________________ Algernon Huxley, MD jsd:ST D: 08/11/2014 15:29:46 ET T: 08/11/2014 22:47:29 ET JOB#: [id number]  cc: Algernon Huxley, MD, <Dictator> Algernon Huxley MD ELECTRONICALLY SIGNED 08/13/2014 13:54

## 2014-08-19 DIAGNOSIS — N2581 Secondary hyperparathyroidism of renal origin: Secondary | ICD-10-CM | POA: Diagnosis not present

## 2014-08-19 DIAGNOSIS — E611 Iron deficiency: Secondary | ICD-10-CM | POA: Diagnosis not present

## 2014-08-19 DIAGNOSIS — N186 End stage renal disease: Secondary | ICD-10-CM | POA: Diagnosis not present

## 2014-08-19 DIAGNOSIS — D509 Iron deficiency anemia, unspecified: Secondary | ICD-10-CM | POA: Diagnosis not present

## 2014-08-19 DIAGNOSIS — Z992 Dependence on renal dialysis: Secondary | ICD-10-CM | POA: Diagnosis not present

## 2014-08-21 DIAGNOSIS — D509 Iron deficiency anemia, unspecified: Secondary | ICD-10-CM | POA: Diagnosis not present

## 2014-08-21 DIAGNOSIS — E611 Iron deficiency: Secondary | ICD-10-CM | POA: Diagnosis not present

## 2014-08-21 DIAGNOSIS — N2581 Secondary hyperparathyroidism of renal origin: Secondary | ICD-10-CM | POA: Diagnosis not present

## 2014-08-21 DIAGNOSIS — Z992 Dependence on renal dialysis: Secondary | ICD-10-CM | POA: Diagnosis not present

## 2014-08-21 DIAGNOSIS — N186 End stage renal disease: Secondary | ICD-10-CM | POA: Diagnosis not present

## 2014-08-23 DIAGNOSIS — E611 Iron deficiency: Secondary | ICD-10-CM | POA: Diagnosis not present

## 2014-08-23 DIAGNOSIS — Z992 Dependence on renal dialysis: Secondary | ICD-10-CM | POA: Diagnosis not present

## 2014-08-23 DIAGNOSIS — N2581 Secondary hyperparathyroidism of renal origin: Secondary | ICD-10-CM | POA: Diagnosis not present

## 2014-08-23 DIAGNOSIS — N186 End stage renal disease: Secondary | ICD-10-CM | POA: Diagnosis not present

## 2014-08-23 DIAGNOSIS — D509 Iron deficiency anemia, unspecified: Secondary | ICD-10-CM | POA: Diagnosis not present

## 2014-08-26 DIAGNOSIS — N186 End stage renal disease: Secondary | ICD-10-CM | POA: Diagnosis not present

## 2014-08-26 DIAGNOSIS — Z992 Dependence on renal dialysis: Secondary | ICD-10-CM | POA: Diagnosis not present

## 2014-08-26 DIAGNOSIS — D509 Iron deficiency anemia, unspecified: Secondary | ICD-10-CM | POA: Diagnosis not present

## 2014-08-26 DIAGNOSIS — N2581 Secondary hyperparathyroidism of renal origin: Secondary | ICD-10-CM | POA: Diagnosis not present

## 2014-08-26 DIAGNOSIS — E611 Iron deficiency: Secondary | ICD-10-CM | POA: Diagnosis not present

## 2014-08-28 DIAGNOSIS — N2581 Secondary hyperparathyroidism of renal origin: Secondary | ICD-10-CM | POA: Diagnosis not present

## 2014-08-28 DIAGNOSIS — N186 End stage renal disease: Secondary | ICD-10-CM | POA: Diagnosis not present

## 2014-08-28 DIAGNOSIS — Z992 Dependence on renal dialysis: Secondary | ICD-10-CM | POA: Diagnosis not present

## 2014-08-28 DIAGNOSIS — E611 Iron deficiency: Secondary | ICD-10-CM | POA: Diagnosis not present

## 2014-08-28 DIAGNOSIS — D509 Iron deficiency anemia, unspecified: Secondary | ICD-10-CM | POA: Diagnosis not present

## 2014-09-01 ENCOUNTER — Encounter
Admission: AD | Disposition: A | Discharge status: Home / Self Care | Payer: BLUE CROSS/BLUE SHIELD | Source: Ambulatory Visit | Attending: Vascular Surgery

## 2014-09-01 ENCOUNTER — Ambulatory Visit
Admission: AD | Admit: 2014-09-01 | Discharge: 2014-09-01 | Disposition: A | Discharge status: Home / Self Care | Payer: Medicare Other | Source: Ambulatory Visit | Attending: Vascular Surgery | Admitting: Vascular Surgery

## 2014-09-01 DIAGNOSIS — Y999 Unspecified external cause status: Secondary | ICD-10-CM | POA: Diagnosis not present

## 2014-09-01 DIAGNOSIS — T82868A Thrombosis of vascular prosthetic devices, implants and grafts, initial encounter: Secondary | ICD-10-CM | POA: Diagnosis not present

## 2014-09-01 DIAGNOSIS — Z79899 Other long term (current) drug therapy: Secondary | ICD-10-CM | POA: Diagnosis not present

## 2014-09-01 DIAGNOSIS — I509 Heart failure, unspecified: Secondary | ICD-10-CM | POA: Insufficient documentation

## 2014-09-01 DIAGNOSIS — I120 Hypertensive chronic kidney disease with stage 5 chronic kidney disease or end stage renal disease: Secondary | ICD-10-CM | POA: Diagnosis not present

## 2014-09-01 DIAGNOSIS — Z992 Dependence on renal dialysis: Secondary | ICD-10-CM | POA: Diagnosis not present

## 2014-09-01 DIAGNOSIS — Z87891 Personal history of nicotine dependence: Secondary | ICD-10-CM | POA: Diagnosis not present

## 2014-09-01 DIAGNOSIS — N186 End stage renal disease: Secondary | ICD-10-CM | POA: Diagnosis not present

## 2014-09-01 DIAGNOSIS — R55 Syncope and collapse: Secondary | ICD-10-CM | POA: Diagnosis not present

## 2014-09-01 DIAGNOSIS — T82898A Other specified complication of vascular prosthetic devices, implants and grafts, initial encounter: Secondary | ICD-10-CM | POA: Diagnosis not present

## 2014-09-01 HISTORY — PX: PERIPHERAL VASCULAR CATHETERIZATION: SHX172C

## 2014-09-01 LAB — POTASSIUM (ARMC VASCULAR LAB ONLY): POTASSIUM (ARMC VASCULAR LAB): 4.6

## 2014-09-01 SURGERY — A/V SHUNTOGRAM/FISTULAGRAM
Anesthesia: Moderate Sedation | Laterality: Right

## 2014-09-01 MED ORDER — STERILE WATER FOR INJECTION IJ SOLN
INTRAMUSCULAR | Status: AC
  Filled 2014-09-01: qty 10

## 2014-09-01 MED ORDER — IOHEXOL 300 MG/ML  SOLN
INTRAMUSCULAR | Status: DC | PRN
  Administered 2014-09-01: 40 mg via INTRAVENOUS

## 2014-09-01 MED ORDER — HEPARIN SODIUM (PORCINE) 1000 UNIT/ML IJ SOLN
INTRAMUSCULAR | Status: AC
  Filled 2014-09-01: qty 1

## 2014-09-01 MED ORDER — FENTANYL CITRATE (PF) 100 MCG/2ML IJ SOLN
INTRAMUSCULAR | Status: DC | PRN
  Administered 2014-09-01 (×2): 50 ug via INTRAVENOUS

## 2014-09-01 MED ORDER — SODIUM CHLORIDE 0.9 % IV SOLN
INTRAVENOUS | Status: DC
  Administered 2014-09-01: 15:00:00 via INTRAVENOUS

## 2014-09-01 MED ORDER — CEFAZOLIN SODIUM 1-5 GM-% IV SOLN
1.0000 g | Freq: Once | INTRAVENOUS | Status: AC
  Administered 2014-09-01: 1 g via INTRAVENOUS

## 2014-09-01 MED ORDER — ALTEPLASE 2 MG IJ SOLR
INTRAMUSCULAR | Status: DC | PRN
  Administered 2014-09-01: 4 mg

## 2014-09-01 MED ORDER — HEPARIN SODIUM (PORCINE) 1000 UNIT/ML IJ SOLN
INTRAMUSCULAR | Status: DC | PRN
  Administered 2014-09-01: 4000 [IU] via INTRAVENOUS

## 2014-09-01 MED ORDER — ATROPINE SULFATE 0.1 MG/ML IJ SOLN: 0.5000 mg | INTRAMUSCULAR | Status: DC | PRN

## 2014-09-01 MED ORDER — LIDOCAINE-EPINEPHRINE (PF) 1 %-1:200000 IJ SOLN
INTRAMUSCULAR | Status: AC
  Filled 2014-09-01: qty 30

## 2014-09-01 MED ORDER — HEPARIN (PORCINE) IN NACL 2-0.9 UNIT/ML-% IJ SOLN
INTRAMUSCULAR | Status: AC
  Filled 2014-09-01: qty 1000

## 2014-09-01 MED ORDER — LIDOCAINE HCL (PF) 1 % IJ SOLN
INTRAMUSCULAR | Status: DC | PRN
  Administered 2014-09-01: 10 mL

## 2014-09-01 MED ORDER — HYDROMORPHONE HCL 1 MG/ML IJ SOLN: 1.0000 mg | INTRAMUSCULAR | Status: DC | PRN

## 2014-09-01 MED ORDER — MIDAZOLAM HCL 2 MG/2ML IJ SOLN
INTRAMUSCULAR | Status: DC | PRN
  Administered 2014-09-01 (×2): 2 mg via INTRAVENOUS

## 2014-09-01 MED ORDER — FENTANYL CITRATE (PF) 100 MCG/2ML IJ SOLN
INTRAMUSCULAR | Status: AC
  Filled 2014-09-01: qty 2

## 2014-09-01 MED ORDER — MIDAZOLAM HCL 5 MG/5ML IJ SOLN
INTRAMUSCULAR | Status: AC
  Filled 2014-09-01: qty 5

## 2014-09-01 SURGICAL SUPPLY — 11 items
BALLN DORADO 7X60X80 (BALLOONS) ×4
BALLOON DORADO 7X60X80 (BALLOONS) ×2 IMPLANT
CATH EMBOLECTOMY 5FR (BALLOONS) ×4 IMPLANT
DEVICE PRESTO INFLATION (MISCELLANEOUS) ×4 IMPLANT
DRAPE BRACHIAL (DRAPES) ×4 IMPLANT
KIT 5FR STIFF NT/TG (MISCELLANEOUS) ×4 IMPLANT
PACK ANGIOGRAPHY (CUSTOM PROCEDURE TRAY) ×4 IMPLANT
SET AVX THROMB ULT (MISCELLANEOUS) ×4 IMPLANT
SHEATH BRITE TIP 6FRX5.5 (SHEATH) ×8 IMPLANT
TOWEL OR 17X26 4PK STRL BLUE (TOWEL DISPOSABLE) ×4 IMPLANT
WIRE MAGIC TORQUE 260C (WIRE) ×8 IMPLANT

## 2014-09-01 NOTE — Discharge Instructions (Signed)
End-Stage Kidney Disease The kidneys are two organs that lie on either side of the spine between the middle of the back and the front of the abdomen. The kidneys:   Remove wastes and extra water from the blood.   Produce important hormones. These help keep bones strong, regulate blood pressure, and help create red blood cells.   Balance the fluids and chemicals in the blood and tissues. End-stage kidney disease occurs when the kidneys are so damaged that they cannot do their job. When the kidneys cannot do their job, life-threatening problems occur. The body cannot stay clean and strong without the help of the kidneys. In end-stage kidney disease, the kidneys cannot get better.You need a new kidney or treatments to do some of the work healthy kidneys do in order to stay alive. CAUSES  End-stage kidney disease usually occurs when a long-lasting (chronic) kidney disease gets worse. It may also occur after the kidneys are suddenly damaged (acute kidney injury).  SYMPTOMS   Swelling (edema) of the legs, ankles, or feet.   Tiredness (lethargy).   Nausea or vomiting.   Confusion.   Problems with urination, such as:   Decreased urine production.   Frequent urination, especially at night.   Frequent accidents in children who are potty trained.   Muscle twitches and cramps.   Persistent itchiness.   Loss of appetite.   Headaches.   Abnormally dark or light skin.   Numbness in the hands or feet.   Easy bruising.   Frequent hiccups.   Menstruation stops. DIAGNOSIS  Your health care provider will measure your blood pressure and take some tests. These may include:   Urine tests.   Blood tests.   Imaging tests, such as:   An ultrasound exam.   Computed tomography (CT).  A kidney biopsy. TREATMENT  There are two treatments for end-stage kidney disease:   A procedure that removes toxic wastes from the body (dialysis).   Receiving a new kidney  (kidney transplant). Both of these treatments have serious risks and consequences. Your health care provider will help you determine which treatment is best for you based on your health, age, and other factors. In addition to having dialysis or a kidney transplant, you may need to take medicines to control high blood pressure (hypertension) and cholesterol and to decrease phosphorus levels in your blood.  HOME CARE INSTRUCTIONS  Follow your prescribed diet.   Take medicines only as directed by your health care provider.   Do not take any new medicines (prescription, over-the-counter, or nutritional supplements) unless approved by your health care provider. Many medicines can worsen your kidney damage or need to have the dose adjusted.   Keep all follow-up visits as directed by your health care provider. MAKE SURE YOU:  Understand these instructions.  Will watch your condition.  Will get help right away if you are not doing well or get worse. Document Released: 06/25/2003 Document Revised: 08/19/2013 Document Reviewed: 12/02/2011 [facility] Patient Information 2015 [place], [place]. This information is not intended to replace advice given to you by your health care provider. Make sure you discuss any questions you have with your health care provider. The drugs you were given will stay in your system until tomorrow, so for the next 24 hours you should not.  Drive an automobile. Make any legal decisions.  Drink any alcoholic beverages.  Today you should start with liquids and gradually work up to solid foods as your are able to tolerate them  Resume  your regular medications as prescribed by your doctor.  Avoid aspirin for 24 hours.    Change the Band-Aid or dressing as needed.  After a 2 days no dressing as needed.  Avoid strenuous activity for the remainder of the day.  Please notify your primary physician immediately if you have any unusual bleeding, trouble breathing, fever >100 degrees  or pain not relieved by the medication your doctor prescribed for your doctor prescribed for you physician   Follow up on June 29th with duplex scan at 10:30 with the PA

## 2014-09-01 NOTE — H&P (Signed)
[facility] Admission History & Physical  MRN : [id number]  [name] [name] is a 49 y.o. (1965/09/04) male who presents with chief complaint of clotted access.  History of Present Illness: Patient is a very pleasant 49 year old gentleman well known to our service for his dialysis access. He was sent from his dialysis access center with a clotted graft today. He has no other complaints and is in his usual state of health.   Current Facility-Administered Medications  Medication Dose Route Frequency Provider Last Rate Last Dose  . atropine 0.1 MG/ML injection 0.5 mg  0.5 mg Intravenous PRN Algernon Huxley, MD      . ceFAZolin (ANCEF) IVPB 1 g/50 mL premix  1 g Intravenous Once Algernon Huxley, MD      . HYDROmorphone (DILAUDID) injection 1 mg  1 mg Intravenous PRN Algernon Huxley, MD        Past Medical History  Diagnosis Date  . Hypertension   . Orthostatic hypotension      2008  . Chest discomfort      2008  . Sinus tachycardia      2008, TSH normal  . Ejection fraction      65%, echo, 2008   /   EF 55-60%, echo, October 28, 2010  . Overweight(278.02)   . Syncope      positional after dialysis... 2008  . Aneurysm      Right arm fistula 3 aneurysms 2011,   plans to have a new procedure  . CHF (congestive heart failure)   . Leg pain   . Angina     occasional, last 6 mo ago  . Dialysis patient     M-W-F @ [facility]  . ESRD (end stage renal disease)     on hemodialysis M-W-F [facility]    Past Surgical History  Procedure Laterality Date  . Right arm graft      for dyalisis  . Umbilical hernia repair    . Thrombectomy    . Arteriovenous graft placement    . Av fistula placement    . Thrombectomy w/ embolectomy  03/01/2011    Procedure: THROMBECTOMY ARTERIOVENOUS GORE-TEX GRAFT;  Surgeon: Elam Dutch, MD;  Location: [place];  Service: Vascular;  Laterality: Right;  Attempted Thrombectomy of Old  Right Upper Arm Arteriovenous gortex Graft. Insertion of new  Arteriovenous Graft using 12mm x 50cm Gortex Stretch graft.   . Insertion of dialysis catheter  03/01/2011    Procedure: INSERTION OF DIALYSIS CATHETER;  Surgeon: Elam Dutch, MD;  Location: [place];  Service: Vascular;  Laterality: Left;  Exchange of Dialysis Catheter to 27cm 15Fr. Arrow Catheter  . Thrombectomy w/ embolectomy  07/11/2011    Procedure: THROMBECTOMY ARTERIOVENOUS GORE-TEX GRAFT;  Surgeon: Rosetta Posner, MD;  Location: [place];  Service: Vascular;  Laterality: Right;  [name] N/A 08/23/2011    Procedure: VENOGRAM;  Surgeon: [name], MD;  Location: [facility] CATH LAB;  Service: Cardiovascular;  Laterality: N/A;    Social History History  Substance Use Topics  . Smoking status: Former Smoker    Types: Cigarettes    Quit date: 04/18/1994  . Smokeless tobacco: Current User    Types: Snuff     Comment: dips 1/2 snuff per day times 25 years  . Alcohol Use: No    Family History Family History  Problem Relation Age of Onset  . Hypertension Mother   . Heart disease Mother   . Hypertension  Father   . Heart disease Father    no clotting or bleeding history to his knowledge  No Known Allergies   REVIEW OF SYSTEMS (Negative unless checked)  Constitutional: [] Weight loss  [] Fever  [] Chills Cardiac: [] Chest pain   [] Chest pressure   [] Palpitations   [] Shortness of breath when laying flat   [] Shortness of breath at rest   [] Shortness of breath with exertion. Vascular:  [] Pain in legs with walking   [] Pain in legs at rest   [] Pain in legs when laying flat   [] Claudication   [] Pain in feet when walking  [] Pain in feet at rest  [] Pain in feet when laying flat   [] History of DVT   [] Phlebitis   [] Swelling in legs   [] Varicose veins   [] Non-healing ulcers Pulmonary:   [] Uses home oxygen   [] Productive cough   [] Hemoptysis   [] Wheeze  [] COPD   [] Asthma Neurologic:  [] Dizziness  [] Blackouts   [] Seizures   [] History of stroke   [] History of TIA  [] Aphasia   [] Temporary blindness    [] Dysphagia   [] Weakness or numbness in arms   [] Weakness or numbness in legs Musculoskeletal:  [] Arthritis   [] Joint swelling   [] Joint pain   [] Low back pain Hematologic:  [] Easy bruising  [] Easy bleeding   [] Hypercoagulable state   [] Anemic  [] Hepatitis Gastrointestinal:  [] Blood in stool   [] Vomiting blood  [] Gastroesophageal reflux/heartburn   [] Difficulty swallowing. Genitourinary:  [] Chronic kidney disease   [] Difficult urination  [] Frequent urination  [] Burning with urination   [] Blood in urine Skin:  [] Rashes   [] Ulcers   [] Wounds Psychological:  [] History of anxiety   []  History of major depression.  Physical Examination  Filed Vitals:   09/01/14 1448  BP: 131/85  Pulse: 80  Temp: 97.5 F (36.4 C)  Resp: 20  Height: 6' (1.829 m)  Weight: 345 lb (156.491 kg)  SpO2: 98%   Body mass index is 46.78 kg/(m^2).  Head: [place]/AT, No temporalis wasting. Prominent temp pulse not noted. Ear/Nose/Throat: Hearing grossly intact, nares w/o erythema or drainage, oropharynx w/o Erythema/Exudate, Mallampati score: 3  Eyes: PERRLA, EOMI.  Neck: Supple, no nuchal rigidity.  No bruit or JVD.  Pulmonary:  Good air movement, clear to auscultation bilaterally, no use of accessory muscles.  Cardiac: RRR, normal S1, S2, no Murmurs, rubs or gallops. Vascular: Right arm hero graft without bruit or thrill. Vessel Right Left  Radial Palpable Palpable                                   Gastrointestinal: soft, non-tender/non-distended. No guarding/reflex.  Musculoskeletal: M/S 5/5 throughout.  Extremities without ischemic changes.  No deformity or atrophy.  Neurologic: CN 2-12 intact. Pain and light touch intact in extremities.  Symmetrical.  Speech is fluent. Motor exam as listed above. Psychiatric: Judgment intact, Mood & affect appropriate for pt's clinical situation. Dermatologic: No rashes or ulcers noted.  No cellulitis or open wounds. Lymph : No Cervical, Axillary, or Inguinal  lymphadenopathy.     CBC Lab Results  Component Value Date   WBC 7.5 11/13/2013   HGB 11.3* 11/13/2013   HCT 36.2* 11/13/2013   MCV 77* 11/13/2013   PLT 211 11/13/2013    BMET    Component Value Date/Time   NA 137 11/13/2013 0408   NA 136 04/06/2012 1426   K 4.1 11/13/2013 0408   K 5.0 08/08/2012 1430  CL 98 11/13/2013 0408   CL 92* 04/06/2012 1426   CO2 25 11/13/2013 0408   CO2 22 04/06/2012 1426   GLUCOSE 64* 11/13/2013 0408   GLUCOSE 87 04/06/2012 1426   BUN 68* 11/13/2013 0408   BUN 78* 04/06/2012 1426   CREATININE 16.45* 11/13/2013 0408   CREATININE 17.82* 04/06/2012 1426   CALCIUM 6.8* 11/13/2013 0408   CALCIUM 8.6 04/06/2012 1426   GFRNONAA 3* 11/13/2013 0408   GFRNONAA 3* 04/06/2012 1426   GFRAA 3* 11/13/2013 0408   GFRAA 3* 04/06/2012 1426   CrCl cannot be calculated (Patient has no serum creatinine result on file.).  COAG Lab Results  Component Value Date   INR 1.05 08/08/2012   INR 0.98 06/01/2011   INR 1.04 03/01/2011      Assessment/Plan #1 end-stage renal disease  #2 clotted right arm graft  We'll attempt declot today to try to restore patency for use of his right arm dialysis graft. Risks and benefits were discussed and informed consent was obtained.   Pete Merten, MD  09/01/2014 3:16 PM

## 2014-09-02 ENCOUNTER — Encounter: Payer: Self-pay | Admitting: Vascular Surgery

## 2014-09-02 DIAGNOSIS — D509 Iron deficiency anemia, unspecified: Secondary | ICD-10-CM | POA: Diagnosis not present

## 2014-09-02 DIAGNOSIS — Z992 Dependence on renal dialysis: Secondary | ICD-10-CM | POA: Diagnosis not present

## 2014-09-02 DIAGNOSIS — N186 End stage renal disease: Secondary | ICD-10-CM | POA: Diagnosis not present

## 2014-09-02 DIAGNOSIS — E611 Iron deficiency: Secondary | ICD-10-CM | POA: Diagnosis not present

## 2014-09-02 DIAGNOSIS — N2581 Secondary hyperparathyroidism of renal origin: Secondary | ICD-10-CM | POA: Diagnosis not present

## 2014-09-04 DIAGNOSIS — D509 Iron deficiency anemia, unspecified: Secondary | ICD-10-CM | POA: Diagnosis not present

## 2014-09-04 DIAGNOSIS — Z992 Dependence on renal dialysis: Secondary | ICD-10-CM | POA: Diagnosis not present

## 2014-09-04 DIAGNOSIS — E611 Iron deficiency: Secondary | ICD-10-CM | POA: Diagnosis not present

## 2014-09-04 DIAGNOSIS — N186 End stage renal disease: Secondary | ICD-10-CM | POA: Diagnosis not present

## 2014-09-04 DIAGNOSIS — N2581 Secondary hyperparathyroidism of renal origin: Secondary | ICD-10-CM | POA: Diagnosis not present

## 2014-09-06 DIAGNOSIS — N2581 Secondary hyperparathyroidism of renal origin: Secondary | ICD-10-CM | POA: Diagnosis not present

## 2014-09-06 DIAGNOSIS — N186 End stage renal disease: Secondary | ICD-10-CM | POA: Diagnosis not present

## 2014-09-06 DIAGNOSIS — Z992 Dependence on renal dialysis: Secondary | ICD-10-CM | POA: Diagnosis not present

## 2014-09-06 DIAGNOSIS — D509 Iron deficiency anemia, unspecified: Secondary | ICD-10-CM | POA: Diagnosis not present

## 2014-09-06 DIAGNOSIS — E611 Iron deficiency: Secondary | ICD-10-CM | POA: Diagnosis not present

## 2014-09-09 DIAGNOSIS — Z992 Dependence on renal dialysis: Secondary | ICD-10-CM | POA: Diagnosis not present

## 2014-09-09 DIAGNOSIS — N186 End stage renal disease: Secondary | ICD-10-CM | POA: Diagnosis not present

## 2014-09-09 DIAGNOSIS — E611 Iron deficiency: Secondary | ICD-10-CM | POA: Diagnosis not present

## 2014-09-09 DIAGNOSIS — D509 Iron deficiency anemia, unspecified: Secondary | ICD-10-CM | POA: Diagnosis not present

## 2014-09-09 DIAGNOSIS — N2581 Secondary hyperparathyroidism of renal origin: Secondary | ICD-10-CM | POA: Diagnosis not present

## 2014-09-11 DIAGNOSIS — N186 End stage renal disease: Secondary | ICD-10-CM | POA: Diagnosis not present

## 2014-09-11 DIAGNOSIS — E611 Iron deficiency: Secondary | ICD-10-CM | POA: Diagnosis not present

## 2014-09-11 DIAGNOSIS — N2581 Secondary hyperparathyroidism of renal origin: Secondary | ICD-10-CM | POA: Diagnosis not present

## 2014-09-11 DIAGNOSIS — D509 Iron deficiency anemia, unspecified: Secondary | ICD-10-CM | POA: Diagnosis not present

## 2014-09-11 DIAGNOSIS — Z992 Dependence on renal dialysis: Secondary | ICD-10-CM | POA: Diagnosis not present

## 2014-09-16 DIAGNOSIS — Z992 Dependence on renal dialysis: Secondary | ICD-10-CM | POA: Diagnosis not present

## 2014-09-16 DIAGNOSIS — N186 End stage renal disease: Secondary | ICD-10-CM | POA: Diagnosis not present

## 2014-09-17 ENCOUNTER — Encounter
Admission: RE | Disposition: A | Discharge status: Home / Self Care | Payer: Self-pay | Source: Ambulatory Visit | Attending: Vascular Surgery

## 2014-09-17 ENCOUNTER — Ambulatory Visit
Admission: RE | Admit: 2014-09-17 | Discharge: 2014-09-17 | Disposition: A | Discharge status: Home / Self Care | Payer: Medicare Other | Source: Ambulatory Visit | Attending: Vascular Surgery | Admitting: Vascular Surgery

## 2014-09-17 ENCOUNTER — Encounter: Payer: Self-pay | Admitting: *Deleted

## 2014-09-17 DIAGNOSIS — T82868A Thrombosis of vascular prosthetic devices, implants and grafts, initial encounter: Secondary | ICD-10-CM | POA: Insufficient documentation

## 2014-09-17 DIAGNOSIS — N186 End stage renal disease: Secondary | ICD-10-CM | POA: Insufficient documentation

## 2014-09-17 DIAGNOSIS — Z992 Dependence on renal dialysis: Secondary | ICD-10-CM | POA: Diagnosis not present

## 2014-09-17 DIAGNOSIS — I120 Hypertensive chronic kidney disease with stage 5 chronic kidney disease or end stage renal disease: Secondary | ICD-10-CM | POA: Insufficient documentation

## 2014-09-17 DIAGNOSIS — T82898A Other specified complication of vascular prosthetic devices, implants and grafts, initial encounter: Secondary | ICD-10-CM | POA: Diagnosis not present

## 2014-09-17 HISTORY — PX: PERIPHERAL VASCULAR CATHETERIZATION: SHX172C

## 2014-09-17 LAB — POTASSIUM (ARMC VASCULAR LAB ONLY): Potassium (ARMC vascular lab): 5.3

## 2014-09-17 SURGERY — A/V SHUNTOGRAM/FISTULAGRAM
Anesthesia: Moderate Sedation

## 2014-09-17 MED ORDER — FENTANYL CITRATE (PF) 100 MCG/2ML IJ SOLN
INTRAMUSCULAR | Status: DC | PRN
  Administered 2014-09-17: 50 ug via INTRAVENOUS
  Administered 2014-09-17: 100 ug via INTRAVENOUS
  Administered 2014-09-17: 50 ug via INTRAVENOUS

## 2014-09-17 MED ORDER — FENTANYL CITRATE (PF) 100 MCG/2ML IJ SOLN
INTRAMUSCULAR | Status: AC
  Filled 2014-09-17: qty 2

## 2014-09-17 MED ORDER — MIDAZOLAM HCL 5 MG/5ML IJ SOLN
INTRAMUSCULAR | Status: AC
  Filled 2014-09-17: qty 5

## 2014-09-17 MED ORDER — APIXABAN 2.5 MG PO TABS: 5.0000 mg | ORAL_TABLET | Freq: Two times a day (BID) | ORAL | Status: DC

## 2014-09-17 MED ORDER — CEFAZOLIN SODIUM 1-5 GM-% IV SOLN
1.0000 g | Freq: Once | INTRAVENOUS | Status: AC
  Administered 2014-09-17: 1 g via INTRAVENOUS

## 2014-09-17 MED ORDER — SODIUM CHLORIDE 0.9 % IV SOLN
INTRAVENOUS | Status: DC
  Administered 2014-09-17: 10:00:00 via INTRAVENOUS

## 2014-09-17 MED ORDER — HEPARIN SODIUM (PORCINE) 1000 UNIT/ML IJ SOLN
INTRAMUSCULAR | Status: DC | PRN
Start: 2014-09-17 — End: 2014-09-17
  Administered 2014-09-17: 4000 [IU] via INTRAVENOUS

## 2014-09-17 MED ORDER — MIDAZOLAM HCL 2 MG/2ML IJ SOLN
INTRAMUSCULAR | Status: DC | PRN
Start: 2014-09-17 — End: 2014-09-17
  Administered 2014-09-17 (×2): 2 mg via INTRAVENOUS
  Administered 2014-09-17: 1 mg via INTRAVENOUS

## 2014-09-17 MED ORDER — CEFAZOLIN SODIUM 1-5 GM-% IV SOLN
INTRAVENOUS | Status: AC
  Filled 2014-09-17: qty 50

## 2014-09-17 MED ORDER — LIDOCAINE-EPINEPHRINE (PF) 1 %-1:200000 IJ SOLN: 10.0000 mL | Freq: Once | INTRAMUSCULAR | Status: DC

## 2014-09-17 MED ORDER — HEPARIN (PORCINE) IN NACL 2-0.9 UNIT/ML-% IJ SOLN
INTRAMUSCULAR | Status: AC
  Filled 2014-09-17: qty 1500

## 2014-09-17 MED ORDER — ALTEPLASE 2 MG IJ SOLR
INTRAMUSCULAR | Status: AC
  Filled 2014-09-17: qty 4

## 2014-09-17 MED ORDER — SODIUM CHLORIDE 0.9 % IV SOLN: INTRAVENOUS | Status: DC

## 2014-09-17 MED ORDER — IOHEXOL 300 MG/ML  SOLN
INTRAMUSCULAR | Status: DC | PRN
  Administered 2014-09-17: 30 mL via INTRAVENOUS

## 2014-09-17 MED ORDER — STERILE WATER FOR INJECTION IJ SOLN
INTRAMUSCULAR | Status: AC
  Filled 2014-09-17: qty 10

## 2014-09-17 MED ORDER — HEPARIN SODIUM (PORCINE) 1000 UNIT/ML IJ SOLN
INTRAMUSCULAR | Status: AC
  Filled 2014-09-17: qty 1

## 2014-09-17 MED ORDER — LIDOCAINE-EPINEPHRINE (PF) 1 %-1:200000 IJ SOLN
INTRAMUSCULAR | Status: AC
  Filled 2014-09-17: qty 30

## 2014-09-17 MED ORDER — LIDOCAINE-EPINEPHRINE (PF) 1 %-1:200000 IJ SOLN
INTRAMUSCULAR | Status: DC | PRN
  Administered 2014-09-17: 10 mL via INTRADERMAL

## 2014-09-17 SURGICAL SUPPLY — 12 items
BALLN DORADO 7X60X80 (BALLOONS) ×3
BALLOON DORADO 7X60X80 (BALLOONS) ×1 IMPLANT
CANNULA 5F STIFF (CANNULA) ×3 IMPLANT
CATH EMBOLECTOMY 5FR (BALLOONS) ×3 IMPLANT
CATH KUMPE (CATHETERS) ×2
CATH SLIP 5FR .038X65 KMP (CATHETERS) ×1
CATH SLIP 5FR 0.38 X 40 KMP (CATHETERS) ×1 IMPLANT
PACK ANGIOGRAPHY (CUSTOM PROCEDURE TRAY) ×3 IMPLANT
SET AVX THROMB ULT (MISCELLANEOUS) ×3 IMPLANT
SHEATH BRITE TIP 6FRX5.5 (SHEATH) ×6 IMPLANT
TOWEL OR 17X26 4PK STRL BLUE (TOWEL DISPOSABLE) ×3 IMPLANT
WIRE MAGIC TOR.035 180C (WIRE) ×6 IMPLANT

## 2014-09-17 NOTE — H&P (Signed)
[facility] Admission History & Physical  MRN : [id number]  [name] [name] is a 49 y.o. (06-19-1965) male who presents with chief complaint of clotted access.  History of Present Illness: Patient with end-stage renal disease well known to our practice. He presents from the dialysis center with a clotted right arm graft. He has no other complaints. He denies any pain.  Current Facility-Administered Medications  Medication Dose Route Frequency Provider Last Rate Last Dose  . 0.9 %  sodium chloride infusion   Intravenous Continuous Algernon Huxley, MD      . 0.9 %  sodium chloride infusion   Intravenous Continuous Algernon Huxley, MD 20 mL/hr at 09/17/14 1029    . ceFAZolin (ANCEF) IVPB 1 g/50 mL premix  1 g Intravenous Once Algernon Huxley, MD        Past Medical History  Diagnosis Date  . Hypertension   . Orthostatic hypotension      2008  . Chest discomfort      2008  . Sinus tachycardia      2008, TSH normal  . Ejection fraction      65%, echo, 2008   /   EF 55-60%, echo, October 28, 2010  . Overweight(278.02)   . Syncope      positional after dialysis... 2008  . Aneurysm      Right arm fistula 3 aneurysms 2011,   plans to have a new procedure  . CHF (congestive heart failure)   . Leg pain   . Angina     occasional, last 6 mo ago  . Dialysis patient     M-W-F @ [facility]  . ESRD (end stage renal disease)     on hemodialysis M-W-F [facility]    Past Surgical History  Procedure Laterality Date  . Right arm graft      for dyalisis  . Umbilical hernia repair    . Thrombectomy    . Arteriovenous graft placement    . Av fistula placement    . Thrombectomy w/ embolectomy  03/01/2011    Procedure: THROMBECTOMY ARTERIOVENOUS GORE-TEX GRAFT;  Surgeon: Elam Dutch, MD;  Location: [place];  Service: Vascular;  Laterality: Right;  Attempted Thrombectomy of Old  Right Upper Arm Arteriovenous gortex Graft. Insertion of new Arteriovenous Graft using 48mm x 50cm Gortex Stretch  graft.   . Insertion of dialysis catheter  03/01/2011    Procedure: INSERTION OF DIALYSIS CATHETER;  Surgeon: Elam Dutch, MD;  Location: [place];  Service: Vascular;  Laterality: Left;  Exchange of Dialysis Catheter to 27cm 15Fr. Arrow Catheter  . Thrombectomy w/ embolectomy  07/11/2011    Procedure: THROMBECTOMY ARTERIOVENOUS GORE-TEX GRAFT;  Surgeon: Rosetta Posner, MD;  Location: [place];  Service: Vascular;  Laterality: Right;  [name] N/A 08/23/2011    Procedure: VENOGRAM;  Surgeon: [name], MD;  Location: [facility] CATH LAB;  Service: Cardiovascular;  Laterality: N/A;  . Peripheral vascular catheterization N/A 09/01/2014    Procedure: A/V Shuntogram/Fistulagram;  Surgeon: Algernon Huxley, MD;  Location: [place] CV LAB;  Service: Cardiovascular;  Laterality: N/A;  . Peripheral vascular catheterization Right 09/01/2014    Procedure: Thrombectomy;  Surgeon: Algernon Huxley, MD;  Location: [place] [facility];  Service: Cardiovascular;  Laterality: Right;  . Peripheral vascular catheterization Right 09/01/2014    Procedure: A/V Shunt Intervention;  Surgeon: Algernon Huxley, MD;  Location: [place] [facility];  Service: Cardiovascular;  Laterality: Right;  Social History History  Substance Use Topics  . Smoking status: Former Smoker    Types: Cigarettes    Quit date: 04/18/1994  . Smokeless tobacco: Current User    Types: Snuff     Comment: dips 1/2 snuff per day times 25 years  . Alcohol Use: No    Family History Family History  Problem Relation Age of Onset  . Hypertension Mother   . Heart disease Mother   . Hypertension Father   . Heart disease Father     No Known Allergies   REVIEW OF SYSTEMS (Negative unless checked)  Constitutional: [] Weight loss  [] Fever  [] Chills Cardiac: [] Chest pain   [] Chest pressure   [] Palpitations   [] Shortness of breath when laying flat   [] Shortness of breath at rest   [] Shortness of breath with exertion. Vascular:  [] Pain in legs with walking    [] Pain in legs at rest   [] Pain in legs when laying flat   [] Claudication   [] Pain in feet when walking  [] Pain in feet at rest  [] Pain in feet when laying flat   [] History of DVT   [] Phlebitis   [] Swelling in legs   [] Varicose veins   [] Non-healing ulcers Pulmonary:   [] Uses home oxygen   [] Productive cough   [] Hemoptysis   [] Wheeze  [] COPD   [] Asthma Neurologic:  [] Dizziness  [] Blackouts   [] Seizures   [] History of stroke   [] History of TIA  [] Aphasia   [] Temporary blindness   [] Dysphagia   [] Weakness or numbness in arms   [] Weakness or numbness in legs Musculoskeletal:  [] Arthritis   [] Joint swelling   [] Joint pain   [] Low back pain Hematologic:  [] Easy bruising  [] Easy bleeding   [] Hypercoagulable state   [] Anemic  [] Hepatitis Gastrointestinal:  [] Blood in stool   [] Vomiting blood  [] Gastroesophageal reflux/heartburn   [] Difficulty swallowing. Genitourinary:  [x] Chronic kidney disease   [] Difficult urination  [] Frequent urination  [] Burning with urination   [] Blood in urine Skin:  [] Rashes   [] Ulcers   [] Wounds Psychological:  [] History of anxiety   []  History of major depression.  Physical Examination  Filed Vitals:   09/17/14 1015  BP: 121/95  Temp: 97.8 F (36.6 C)  TempSrc: Oral  Resp: 16  SpO2: 95%   There is no weight on file to calculate BMI.  Head: [place]/AT, No temporalis wasting. Prominent temp pulse not noted. Ear/Nose/Throat: Hearing grossly intact, nares w/o erythema or drainage, oropharynx w/o Erythema/Exudate,  Eyes: PERRLA, EOMI.  Neck: Supple, no nuchal rigidity.  No bruit or JVD.  Pulmonary:  Good air movement, clear to auscultation bilaterally, no use of accessory muscles.  Cardiac: RRR, normal S1, S2, no Murmurs, rubs or gallops. Vascular: No thrill or bruit in right arm graft Vessel Right Left  Radial Palpable Palpable  Ulnar    Brachial    Carotid    Aorta    Femoral    Popliteal    PT    DP     Gastrointestinal: soft, non-tender/non-distended. No  guarding/reflex.  Musculoskeletal: M/S 5/5 throughout.  Extremities without ischemic changes.  No deformity or atrophy.  Neurologic: CN 2-12 intact. Pain and light touch intact in extremities.  Symmetrical.  Speech is fluent. Motor exam as listed above. Psychiatric: Judgment intact, Mood & affect appropriate for pt's clinical situation. Dermatologic: No rashes or ulcers noted.  No cellulitis or open wounds. Lymph : No Cervical, Axillary, or Inguinal lymphadenopathy.      CBC Lab Results  Component Value  Date   WBC 7.5 11/13/2013   HGB 11.3* 11/13/2013   HCT 36.2* 11/13/2013   MCV 77* 11/13/2013   PLT 211 11/13/2013    BMET    Component Value Date/Time   NA 137 11/13/2013 0408   NA 136 04/06/2012 1426   K 4.1 11/13/2013 0408   K 5.0 08/08/2012 1430   CL 98 11/13/2013 0408   CL 92* 04/06/2012 1426   CO2 25 11/13/2013 0408   CO2 22 04/06/2012 1426   GLUCOSE 64* 11/13/2013 0408   GLUCOSE 87 04/06/2012 1426   BUN 68* 11/13/2013 0408   BUN 78* 04/06/2012 1426   CREATININE 16.45* 11/13/2013 0408   CREATININE 17.82* 04/06/2012 1426   CALCIUM 6.8* 11/13/2013 0408   CALCIUM 8.6 04/06/2012 1426   GFRNONAA 3* 11/13/2013 0408   GFRNONAA 3* 04/06/2012 1426   GFRAA 3* 11/13/2013 0408   GFRAA 3* 04/06/2012 1426   CrCl cannot be calculated (Unknown ideal weight.).  COAG Lab Results  Component Value Date   INR 1.05 08/08/2012   INR 0.98 06/01/2011   INR 1.04 03/01/2011      Assessment/Plan End-stage renal disease: Has been on dialysis for many years. He needs graft declotted to restore an access for dialysis. Complication of dialysis access device: Graft with recurrent thrombosis. Will attempt salvage with declot procedure today.   DEW,JASON, MD  09/17/2014 11:21 AM

## 2014-09-17 NOTE — CV Procedure (Signed)
[name]    OPERATIVE NOTE   PROCEDURE: 1.  Right arm HeRO graft cannulation under ultrasound guidance in both a retrograde and then antegrade fashion crossing 2.  Right arm shuntogram and central venogram 3.  Catheter directed thrombolysis with 4 mg of TPA delivered with the AngioJet AVX catheter 4.  Mechanical rheolytic thrombectomy to the HeRO graft with the AngioJet AVX catheter 5.  Fogarty embolectomy for residual arterial plug 6.  Percutaneous transluminal angioplasty of arterial anastomosis with 7 mm x 6 cm balloon  PRE-OPERATIVE DIAGNOSIS: 1. ESRD 2.  Thrombosed right arm HeRO graft  POST-OPERATIVE DIAGNOSIS: same as above   SURGEON: [name] Pain, MD  ANESTHESIA: local with MCS  ESTIMATED BLOOD LOSS: 25 cc  FINDING(S): 1. Thrombosed HeRO graft, opened with intervention  SPECIMEN(S):  None  CONTRAST: 30 cc  INDICATIONS: Patient is a 49 yo [name] who presents with a thrombosed right arm HeRO graft.  The patient is scheduled for an attempted declot and shuntogram.  The patient is aware the risks include but are not limited to: bleeding, infection, thrombosis of the cannulated access, and possible anaphylactic reaction to the contrast.  The patient is aware of the risks of the procedure and elects to proceed forward.  DESCRIPTION: After full informed written consent was obtained, the patient was brought back to the angiography suite and placed supine upon the angiography table.  The patient was connected to monitoring equipment.  The  right arm was prepped and draped in the standard fashion for a percutaneous access intervention.  Under ultrasound guidance, the right arm HeRO graft was cannulated with a micropuncture needle under direct ultrasound guidance due to the pulseless nature of the graft in both an antegrade and a retrograde fashion crossing, and permanent images were performed.  The microwire was advanced and the needle was exchanged for the a  microsheath.  I then upsized to a 6 Fr Sheath and imaging was performed.  Hand injections were completed to image the access including the central venous system. This demonstrated no flow within the HeRO graft.  Based on the images, this patient will need extensive treatment to salvage the graft. I then gave the patient 4000 units of intravenous heparin.  I then placed a Magic torque wire into the brachial artery from the retrograde sheath and into the right atrium from the antegrade sheath. 4 mg of TPA were deployed throughout the entirety of the PTFE portion of the graft and into the brachial artery at the anastomosis. This was allowed to dwell for 10-15 minutes. Mechanical rheolytic thrombectomy was then performed throughout the PTFE portion of the HeRO graft and into the brachial artery at the anastomosis. This uncovered thrombus within the graft that was improved but not resolved and a residual arterial plug was also seen at the arterial anastomosis. An attempt to clear the arterial plug was done with 2 passes of the Fogarty embolectomy balloon. Flow-limiting arterial plug remained, and I elected to treat this lesion with a 7 mm diameter 6 cm length angioplasty balloon. This resulted in resolution of the arterial plug, and clearance of the arterial side of the graft. The arterial outflow was seen to be intact distal to the graft on these images. The retrograde sheath was removed as a 4-0 Monocryl pursestring suture was placed. I then turned my attention to the thrombus in the distal graft. Mechanical rheolytic thrombectomy was performed again. This resulted in resolution of the graft and restoration of patency. There was  clinically good flow within the graft with a pulse and thrill present.     Based on the completion imaging, no further intervention is necessary.  The wire and balloon were removed from the sheath.  A 4-0 Monocryl purse-string suture was sewn around the sheath.  The sheath was removed  while tying down the suture.  A sterile bandage was applied to the puncture site.  COMPLICATIONS: None  CONDITION: Stable   [name],[name] 09/17/2014 12:53 PM

## 2014-09-18 DIAGNOSIS — N2581 Secondary hyperparathyroidism of renal origin: Secondary | ICD-10-CM | POA: Diagnosis not present

## 2014-09-18 DIAGNOSIS — E611 Iron deficiency: Secondary | ICD-10-CM | POA: Diagnosis not present

## 2014-09-18 DIAGNOSIS — Z992 Dependence on renal dialysis: Secondary | ICD-10-CM | POA: Diagnosis not present

## 2014-09-18 DIAGNOSIS — D509 Iron deficiency anemia, unspecified: Secondary | ICD-10-CM | POA: Diagnosis not present

## 2014-09-18 DIAGNOSIS — N186 End stage renal disease: Secondary | ICD-10-CM | POA: Diagnosis not present

## 2014-09-20 DIAGNOSIS — N2581 Secondary hyperparathyroidism of renal origin: Secondary | ICD-10-CM | POA: Diagnosis not present

## 2014-09-20 DIAGNOSIS — Z992 Dependence on renal dialysis: Secondary | ICD-10-CM | POA: Diagnosis not present

## 2014-09-20 DIAGNOSIS — E611 Iron deficiency: Secondary | ICD-10-CM | POA: Diagnosis not present

## 2014-09-20 DIAGNOSIS — D509 Iron deficiency anemia, unspecified: Secondary | ICD-10-CM | POA: Diagnosis not present

## 2014-09-20 DIAGNOSIS — N186 End stage renal disease: Secondary | ICD-10-CM | POA: Diagnosis not present

## 2014-09-23 DIAGNOSIS — Z992 Dependence on renal dialysis: Secondary | ICD-10-CM | POA: Diagnosis not present

## 2014-09-23 DIAGNOSIS — D509 Iron deficiency anemia, unspecified: Secondary | ICD-10-CM | POA: Diagnosis not present

## 2014-09-23 DIAGNOSIS — N2581 Secondary hyperparathyroidism of renal origin: Secondary | ICD-10-CM | POA: Diagnosis not present

## 2014-09-23 DIAGNOSIS — N186 End stage renal disease: Secondary | ICD-10-CM | POA: Diagnosis not present

## 2014-09-23 DIAGNOSIS — E611 Iron deficiency: Secondary | ICD-10-CM | POA: Diagnosis not present

## 2014-09-25 DIAGNOSIS — Z992 Dependence on renal dialysis: Secondary | ICD-10-CM | POA: Diagnosis not present

## 2014-09-25 DIAGNOSIS — D509 Iron deficiency anemia, unspecified: Secondary | ICD-10-CM | POA: Diagnosis not present

## 2014-09-25 DIAGNOSIS — E611 Iron deficiency: Secondary | ICD-10-CM | POA: Diagnosis not present

## 2014-09-25 DIAGNOSIS — N2581 Secondary hyperparathyroidism of renal origin: Secondary | ICD-10-CM | POA: Diagnosis not present

## 2014-09-25 DIAGNOSIS — N186 End stage renal disease: Secondary | ICD-10-CM | POA: Diagnosis not present

## 2014-09-27 DIAGNOSIS — E611 Iron deficiency: Secondary | ICD-10-CM | POA: Diagnosis not present

## 2014-09-27 DIAGNOSIS — D509 Iron deficiency anemia, unspecified: Secondary | ICD-10-CM | POA: Diagnosis not present

## 2014-09-27 DIAGNOSIS — Z992 Dependence on renal dialysis: Secondary | ICD-10-CM | POA: Diagnosis not present

## 2014-09-27 DIAGNOSIS — N186 End stage renal disease: Secondary | ICD-10-CM | POA: Diagnosis not present

## 2014-09-27 DIAGNOSIS — N2581 Secondary hyperparathyroidism of renal origin: Secondary | ICD-10-CM | POA: Diagnosis not present

## 2014-09-30 DIAGNOSIS — Z992 Dependence on renal dialysis: Secondary | ICD-10-CM | POA: Diagnosis not present

## 2014-09-30 DIAGNOSIS — D509 Iron deficiency anemia, unspecified: Secondary | ICD-10-CM | POA: Diagnosis not present

## 2014-09-30 DIAGNOSIS — N186 End stage renal disease: Secondary | ICD-10-CM | POA: Diagnosis not present

## 2014-09-30 DIAGNOSIS — E611 Iron deficiency: Secondary | ICD-10-CM | POA: Diagnosis not present

## 2014-09-30 DIAGNOSIS — N2581 Secondary hyperparathyroidism of renal origin: Secondary | ICD-10-CM | POA: Diagnosis not present

## 2014-10-02 DIAGNOSIS — Z992 Dependence on renal dialysis: Secondary | ICD-10-CM | POA: Diagnosis not present

## 2014-10-02 DIAGNOSIS — N186 End stage renal disease: Secondary | ICD-10-CM | POA: Diagnosis not present

## 2014-10-02 DIAGNOSIS — E611 Iron deficiency: Secondary | ICD-10-CM | POA: Diagnosis not present

## 2014-10-02 DIAGNOSIS — N2581 Secondary hyperparathyroidism of renal origin: Secondary | ICD-10-CM | POA: Diagnosis not present

## 2014-10-02 DIAGNOSIS — D509 Iron deficiency anemia, unspecified: Secondary | ICD-10-CM | POA: Diagnosis not present

## 2014-10-04 DIAGNOSIS — N186 End stage renal disease: Secondary | ICD-10-CM | POA: Diagnosis not present

## 2014-10-04 DIAGNOSIS — E611 Iron deficiency: Secondary | ICD-10-CM | POA: Diagnosis not present

## 2014-10-04 DIAGNOSIS — Z992 Dependence on renal dialysis: Secondary | ICD-10-CM | POA: Diagnosis not present

## 2014-10-04 DIAGNOSIS — N2581 Secondary hyperparathyroidism of renal origin: Secondary | ICD-10-CM | POA: Diagnosis not present

## 2014-10-04 DIAGNOSIS — D509 Iron deficiency anemia, unspecified: Secondary | ICD-10-CM | POA: Diagnosis not present

## 2014-10-07 DIAGNOSIS — Z992 Dependence on renal dialysis: Secondary | ICD-10-CM | POA: Diagnosis not present

## 2014-10-07 DIAGNOSIS — E611 Iron deficiency: Secondary | ICD-10-CM | POA: Diagnosis not present

## 2014-10-07 DIAGNOSIS — N186 End stage renal disease: Secondary | ICD-10-CM | POA: Diagnosis not present

## 2014-10-07 DIAGNOSIS — N2581 Secondary hyperparathyroidism of renal origin: Secondary | ICD-10-CM | POA: Diagnosis not present

## 2014-10-07 DIAGNOSIS — D509 Iron deficiency anemia, unspecified: Secondary | ICD-10-CM | POA: Diagnosis not present

## 2014-10-09 DIAGNOSIS — D509 Iron deficiency anemia, unspecified: Secondary | ICD-10-CM | POA: Diagnosis not present

## 2014-10-09 DIAGNOSIS — Z992 Dependence on renal dialysis: Secondary | ICD-10-CM | POA: Diagnosis not present

## 2014-10-09 DIAGNOSIS — N2581 Secondary hyperparathyroidism of renal origin: Secondary | ICD-10-CM | POA: Diagnosis not present

## 2014-10-09 DIAGNOSIS — E611 Iron deficiency: Secondary | ICD-10-CM | POA: Diagnosis not present

## 2014-10-09 DIAGNOSIS — N186 End stage renal disease: Secondary | ICD-10-CM | POA: Diagnosis not present

## 2014-10-11 DIAGNOSIS — D509 Iron deficiency anemia, unspecified: Secondary | ICD-10-CM | POA: Diagnosis not present

## 2014-10-11 DIAGNOSIS — Z992 Dependence on renal dialysis: Secondary | ICD-10-CM | POA: Diagnosis not present

## 2014-10-11 DIAGNOSIS — N186 End stage renal disease: Secondary | ICD-10-CM | POA: Diagnosis not present

## 2014-10-11 DIAGNOSIS — N2581 Secondary hyperparathyroidism of renal origin: Secondary | ICD-10-CM | POA: Diagnosis not present

## 2014-10-11 DIAGNOSIS — E611 Iron deficiency: Secondary | ICD-10-CM | POA: Diagnosis not present

## 2014-10-14 DIAGNOSIS — N186 End stage renal disease: Secondary | ICD-10-CM | POA: Diagnosis not present

## 2014-10-14 DIAGNOSIS — D509 Iron deficiency anemia, unspecified: Secondary | ICD-10-CM | POA: Diagnosis not present

## 2014-10-14 DIAGNOSIS — E611 Iron deficiency: Secondary | ICD-10-CM | POA: Diagnosis not present

## 2014-10-14 DIAGNOSIS — Z992 Dependence on renal dialysis: Secondary | ICD-10-CM | POA: Diagnosis not present

## 2014-10-14 DIAGNOSIS — N2581 Secondary hyperparathyroidism of renal origin: Secondary | ICD-10-CM | POA: Diagnosis not present

## 2014-10-16 DIAGNOSIS — N186 End stage renal disease: Secondary | ICD-10-CM | POA: Diagnosis not present

## 2014-10-16 DIAGNOSIS — Z992 Dependence on renal dialysis: Secondary | ICD-10-CM | POA: Diagnosis not present

## 2014-10-17 ENCOUNTER — Ambulatory Visit
Admission: RE | Admit: 2014-10-17 | Discharge: 2014-10-17 | Disposition: A | Discharge status: Home / Self Care | Payer: Medicare Other | Source: Ambulatory Visit | Attending: Vascular Surgery | Admitting: Vascular Surgery

## 2014-10-17 ENCOUNTER — Encounter: Payer: Self-pay | Admitting: *Deleted

## 2014-10-17 ENCOUNTER — Encounter
Admission: RE | Disposition: A | Discharge status: Home / Self Care | Payer: Self-pay | Source: Ambulatory Visit | Attending: Vascular Surgery

## 2014-10-17 DIAGNOSIS — T82868A Thrombosis of vascular prosthetic devices, implants and grafts, initial encounter: Secondary | ICD-10-CM | POA: Diagnosis not present

## 2014-10-17 DIAGNOSIS — N186 End stage renal disease: Secondary | ICD-10-CM | POA: Insufficient documentation

## 2014-10-17 DIAGNOSIS — Z87891 Personal history of nicotine dependence: Secondary | ICD-10-CM | POA: Diagnosis not present

## 2014-10-17 DIAGNOSIS — I509 Heart failure, unspecified: Secondary | ICD-10-CM | POA: Insufficient documentation

## 2014-10-17 DIAGNOSIS — Z992 Dependence on renal dialysis: Secondary | ICD-10-CM | POA: Insufficient documentation

## 2014-10-17 DIAGNOSIS — I120 Hypertensive chronic kidney disease with stage 5 chronic kidney disease or end stage renal disease: Secondary | ICD-10-CM | POA: Insufficient documentation

## 2014-10-17 DIAGNOSIS — T82858A Stenosis of vascular prosthetic devices, implants and grafts, initial encounter: Secondary | ICD-10-CM | POA: Diagnosis not present

## 2014-10-17 HISTORY — PX: PERIPHERAL VASCULAR CATHETERIZATION: SHX172C

## 2014-10-17 LAB — POTASSIUM (ARMC VASCULAR LAB ONLY): POTASSIUM (ARMC VASCULAR LAB): 3.9

## 2014-10-17 SURGERY — THROMBECTOMY
Anesthesia: Moderate Sedation | Laterality: Right

## 2014-10-17 MED ORDER — FENTANYL CITRATE (PF) 100 MCG/2ML IJ SOLN
INTRAMUSCULAR | Status: AC
  Filled 2014-10-17: qty 2

## 2014-10-17 MED ORDER — LIDOCAINE HCL (PF) 1 % IJ SOLN
INTRAMUSCULAR | Status: AC
  Filled 2014-10-17: qty 10

## 2014-10-17 MED ORDER — HEPARIN SODIUM (PORCINE) 1000 UNIT/ML IJ SOLN
INTRAMUSCULAR | Status: DC | PRN
  Administered 2014-10-17: 4000 [IU] via INTRAVENOUS

## 2014-10-17 MED ORDER — HEPARIN SODIUM (PORCINE) 1000 UNIT/ML IJ SOLN
INTRAMUSCULAR | Status: AC
  Filled 2014-10-17: qty 1

## 2014-10-17 MED ORDER — MIDAZOLAM HCL 5 MG/5ML IJ SOLN
INTRAMUSCULAR | Status: AC
  Filled 2014-10-17: qty 5

## 2014-10-17 MED ORDER — CEFAZOLIN SODIUM 1-5 GM-% IV SOLN
1.0000 g | Freq: Once | INTRAVENOUS | Status: AC
  Administered 2014-10-17: 1 g via INTRAVENOUS

## 2014-10-17 MED ORDER — FENTANYL CITRATE (PF) 100 MCG/2ML IJ SOLN
INTRAMUSCULAR | Status: DC | PRN
  Administered 2014-10-17: 100 ug via INTRAVENOUS
  Administered 2014-10-17: 50 ug via INTRAVENOUS
  Administered 2014-10-17: 100 ug via INTRAVENOUS
  Administered 2014-10-17 (×2): 50 ug via INTRAVENOUS

## 2014-10-17 MED ORDER — MIDAZOLAM HCL 2 MG/2ML IJ SOLN
INTRAMUSCULAR | Status: AC
  Filled 2014-10-17: qty 2

## 2014-10-17 MED ORDER — CEFAZOLIN SODIUM 1-5 GM-% IV SOLN
INTRAVENOUS | Status: AC
  Filled 2014-10-17: qty 50

## 2014-10-17 MED ORDER — IOHEXOL 300 MG/ML  SOLN
INTRAMUSCULAR | Status: DC | PRN
  Administered 2014-10-17: 55 mL via INTRAVENOUS

## 2014-10-17 MED ORDER — SODIUM CHLORIDE 0.9 % IV SOLN
INTRAVENOUS | Status: DC
  Administered 2014-10-17: 12:00:00 via INTRAVENOUS

## 2014-10-17 MED ORDER — MIDAZOLAM HCL 2 MG/2ML IJ SOLN
INTRAMUSCULAR | Status: DC | PRN
Start: 2014-10-17 — End: 2014-10-17
  Administered 2014-10-17: 2 mg via INTRAVENOUS
  Administered 2014-10-17 (×2): 1 mg via INTRAVENOUS
  Administered 2014-10-17 (×2): 2 mg via INTRAVENOUS

## 2014-10-17 MED ORDER — HEPARIN (PORCINE) IN NACL 2-0.9 UNIT/ML-% IJ SOLN
INTRAMUSCULAR | Status: AC
  Filled 2014-10-17: qty 1000

## 2014-10-17 SURGICAL SUPPLY — 15 items
BALLN DORADO 6X80X80 (BALLOONS) ×4
BALLN DORADO 8X60X80 (BALLOONS) ×4
BALLOON DORADO 6X80X80 (BALLOONS) ×2 IMPLANT
BALLOON DORADO 8X60X80 (BALLOONS) ×2 IMPLANT
CATH BEACON 5.038 65CM KMP-01 (CATHETERS) ×4 IMPLANT
DEVICE PRESTO INFLATION (MISCELLANEOUS) ×4 IMPLANT
DRAPE BRACHIAL (DRAPES) ×4 IMPLANT
GUIDEWIRE ANGLED .035 180CM (WIRE) ×4 IMPLANT
KIT THROMB PERC PTD (MISCELLANEOUS) ×4 IMPLANT
PACK ANGIOGRAPHY (CUSTOM PROCEDURE TRAY) ×4 IMPLANT
SET INTRO CAPELLA COAXIAL (SET/KITS/TRAYS/PACK) ×4 IMPLANT
SHEATH BRITE TIP 6FRX5.5 (SHEATH) ×8 IMPLANT
SUT MNCRL AB 4-0 PS2 18 (SUTURE) ×4 IMPLANT
TOWEL OR 17X26 4PK STRL BLUE (TOWEL DISPOSABLE) ×4 IMPLANT
WIRE MAGIC TORQUE 260C (WIRE) ×4 IMPLANT

## 2014-10-17 NOTE — Discharge Instructions (Signed)

## 2014-10-17 NOTE — H&P (Signed)
[facility] History & Physical Update  The patient was interviewed and re-examined.  The patient's previous History and Physical has been reviewed and is unchanged.  There is no change in the plan of care. We plan to proceed with the scheduled procedure.  Schnier, Dolores Lory, MD  10/17/2014, 1:24 PM

## 2014-10-18 DIAGNOSIS — N186 End stage renal disease: Secondary | ICD-10-CM | POA: Diagnosis not present

## 2014-10-18 DIAGNOSIS — Z992 Dependence on renal dialysis: Secondary | ICD-10-CM | POA: Diagnosis not present

## 2014-10-18 DIAGNOSIS — D509 Iron deficiency anemia, unspecified: Secondary | ICD-10-CM | POA: Diagnosis not present

## 2014-10-18 DIAGNOSIS — E611 Iron deficiency: Secondary | ICD-10-CM | POA: Diagnosis not present

## 2014-10-18 DIAGNOSIS — N2581 Secondary hyperparathyroidism of renal origin: Secondary | ICD-10-CM | POA: Diagnosis not present

## 2014-10-21 ENCOUNTER — Encounter: Payer: Self-pay | Admitting: Vascular Surgery

## 2014-10-21 DIAGNOSIS — Z992 Dependence on renal dialysis: Secondary | ICD-10-CM | POA: Diagnosis not present

## 2014-10-21 DIAGNOSIS — D509 Iron deficiency anemia, unspecified: Secondary | ICD-10-CM | POA: Diagnosis not present

## 2014-10-21 DIAGNOSIS — N2581 Secondary hyperparathyroidism of renal origin: Secondary | ICD-10-CM | POA: Diagnosis not present

## 2014-10-21 DIAGNOSIS — E611 Iron deficiency: Secondary | ICD-10-CM | POA: Diagnosis not present

## 2014-10-21 DIAGNOSIS — N186 End stage renal disease: Secondary | ICD-10-CM | POA: Diagnosis not present

## 2014-10-23 DIAGNOSIS — N186 End stage renal disease: Secondary | ICD-10-CM | POA: Diagnosis not present

## 2014-10-23 DIAGNOSIS — E611 Iron deficiency: Secondary | ICD-10-CM | POA: Diagnosis not present

## 2014-10-23 DIAGNOSIS — N2581 Secondary hyperparathyroidism of renal origin: Secondary | ICD-10-CM | POA: Diagnosis not present

## 2014-10-23 DIAGNOSIS — D509 Iron deficiency anemia, unspecified: Secondary | ICD-10-CM | POA: Diagnosis not present

## 2014-10-23 DIAGNOSIS — Z992 Dependence on renal dialysis: Secondary | ICD-10-CM | POA: Diagnosis not present

## 2014-10-25 DIAGNOSIS — D509 Iron deficiency anemia, unspecified: Secondary | ICD-10-CM | POA: Diagnosis not present

## 2014-10-25 DIAGNOSIS — N186 End stage renal disease: Secondary | ICD-10-CM | POA: Diagnosis not present

## 2014-10-25 DIAGNOSIS — Z992 Dependence on renal dialysis: Secondary | ICD-10-CM | POA: Diagnosis not present

## 2014-10-25 DIAGNOSIS — N2581 Secondary hyperparathyroidism of renal origin: Secondary | ICD-10-CM | POA: Diagnosis not present

## 2014-10-25 DIAGNOSIS — E611 Iron deficiency: Secondary | ICD-10-CM | POA: Diagnosis not present

## 2014-10-28 DIAGNOSIS — N186 End stage renal disease: Secondary | ICD-10-CM | POA: Diagnosis not present

## 2014-10-28 DIAGNOSIS — Z992 Dependence on renal dialysis: Secondary | ICD-10-CM | POA: Diagnosis not present

## 2014-10-28 DIAGNOSIS — D509 Iron deficiency anemia, unspecified: Secondary | ICD-10-CM | POA: Diagnosis not present

## 2014-10-28 DIAGNOSIS — N2581 Secondary hyperparathyroidism of renal origin: Secondary | ICD-10-CM | POA: Diagnosis not present

## 2014-10-28 DIAGNOSIS — E611 Iron deficiency: Secondary | ICD-10-CM | POA: Diagnosis not present

## 2014-10-30 DIAGNOSIS — N2581 Secondary hyperparathyroidism of renal origin: Secondary | ICD-10-CM | POA: Diagnosis not present

## 2014-10-30 DIAGNOSIS — E611 Iron deficiency: Secondary | ICD-10-CM | POA: Diagnosis not present

## 2014-10-30 DIAGNOSIS — Z992 Dependence on renal dialysis: Secondary | ICD-10-CM | POA: Diagnosis not present

## 2014-10-30 DIAGNOSIS — D509 Iron deficiency anemia, unspecified: Secondary | ICD-10-CM | POA: Diagnosis not present

## 2014-10-30 DIAGNOSIS — N186 End stage renal disease: Secondary | ICD-10-CM | POA: Diagnosis not present

## 2014-11-04 ENCOUNTER — Ambulatory Visit
Admission: RE | Admit: 2014-11-04 | Discharge: 2014-11-04 | Disposition: A | Discharge status: Home / Self Care | Payer: Medicare Other | Source: Ambulatory Visit | Attending: Vascular Surgery | Admitting: Vascular Surgery

## 2014-11-04 ENCOUNTER — Encounter: Payer: Self-pay | Admitting: *Deleted

## 2014-11-04 ENCOUNTER — Encounter
Admission: RE | Disposition: A | Discharge status: Home / Self Care | Payer: Self-pay | Source: Ambulatory Visit | Attending: Vascular Surgery

## 2014-11-04 DIAGNOSIS — T82868A Thrombosis of vascular prosthetic devices, implants and grafts, initial encounter: Secondary | ICD-10-CM | POA: Insufficient documentation

## 2014-11-04 DIAGNOSIS — T82858A Stenosis of vascular prosthetic devices, implants and grafts, initial encounter: Secondary | ICD-10-CM | POA: Diagnosis not present

## 2014-11-04 DIAGNOSIS — J45909 Unspecified asthma, uncomplicated: Secondary | ICD-10-CM | POA: Diagnosis not present

## 2014-11-04 DIAGNOSIS — I209 Angina pectoris, unspecified: Secondary | ICD-10-CM | POA: Diagnosis not present

## 2014-11-04 DIAGNOSIS — Z79899 Other long term (current) drug therapy: Secondary | ICD-10-CM | POA: Diagnosis not present

## 2014-11-04 DIAGNOSIS — Z992 Dependence on renal dialysis: Secondary | ICD-10-CM | POA: Diagnosis not present

## 2014-11-04 DIAGNOSIS — Z6841 Body Mass Index (BMI) 40.0 and over, adult: Secondary | ICD-10-CM | POA: Diagnosis not present

## 2014-11-04 DIAGNOSIS — N186 End stage renal disease: Secondary | ICD-10-CM | POA: Diagnosis not present

## 2014-11-04 DIAGNOSIS — J449 Chronic obstructive pulmonary disease, unspecified: Secondary | ICD-10-CM | POA: Diagnosis not present

## 2014-11-04 DIAGNOSIS — Y832 Surgical operation with anastomosis, bypass or graft as the cause of abnormal reaction of the patient, or of later complication, without mention of misadventure at the time of the procedure: Secondary | ICD-10-CM | POA: Insufficient documentation

## 2014-11-04 DIAGNOSIS — I120 Hypertensive chronic kidney disease with stage 5 chronic kidney disease or end stage renal disease: Secondary | ICD-10-CM | POA: Insufficient documentation

## 2014-11-04 DIAGNOSIS — Z87891 Personal history of nicotine dependence: Secondary | ICD-10-CM | POA: Insufficient documentation

## 2014-11-04 HISTORY — PX: PERIPHERAL VASCULAR CATHETERIZATION: SHX172C

## 2014-11-04 LAB — POTASSIUM (ARMC VASCULAR LAB ONLY): POTASSIUM (ARMC VASCULAR LAB): 4.6

## 2014-11-04 SURGERY — THROMBECTOMY
Anesthesia: Moderate Sedation | Laterality: Right

## 2014-11-04 MED ORDER — FENTANYL CITRATE (PF) 100 MCG/2ML IJ SOLN
INTRAMUSCULAR | Status: AC
  Filled 2014-11-04: qty 2

## 2014-11-04 MED ORDER — MIDAZOLAM HCL 5 MG/5ML IJ SOLN
INTRAMUSCULAR | Status: AC
  Filled 2014-11-04: qty 5

## 2014-11-04 MED ORDER — IOHEXOL 300 MG/ML  SOLN
INTRAMUSCULAR | Status: DC | PRN
  Administered 2014-11-04: 50 mL via INTRA_ARTERIAL

## 2014-11-04 MED ORDER — HEPARIN SODIUM (PORCINE) 1000 UNIT/ML IJ SOLN
INTRAMUSCULAR | Status: AC
  Filled 2014-11-04: qty 1

## 2014-11-04 MED ORDER — FENTANYL CITRATE (PF) 100 MCG/2ML IJ SOLN
INTRAMUSCULAR | Status: DC | PRN
  Administered 2014-11-04 (×3): 50 ug via INTRAVENOUS

## 2014-11-04 MED ORDER — LIDOCAINE HCL (PF) 1 % IJ SOLN
INTRAMUSCULAR | Status: AC
  Filled 2014-11-04: qty 10

## 2014-11-04 MED ORDER — HEPARIN (PORCINE) IN NACL 2-0.9 UNIT/ML-% IJ SOLN
INTRAMUSCULAR | Status: AC
Start: 2014-11-04 — End: 2014-11-04
  Filled 2014-11-04: qty 1000

## 2014-11-04 MED ORDER — SODIUM CHLORIDE 0.9 % IV SOLN: INTRAVENOUS | Status: DC

## 2014-11-04 MED ORDER — HEPARIN SODIUM (PORCINE) 1000 UNIT/ML IJ SOLN
INTRAMUSCULAR | Status: DC | PRN
  Administered 2014-11-04: 4000 [IU] via INTRAVENOUS

## 2014-11-04 MED ORDER — MIDAZOLAM HCL 2 MG/2ML IJ SOLN
INTRAMUSCULAR | Status: DC | PRN
  Administered 2014-11-04 (×2): 2 mg via INTRAVENOUS

## 2014-11-04 MED ORDER — CEFAZOLIN SODIUM 1-5 GM-% IV SOLN
INTRAVENOUS | Status: AC
  Filled 2014-11-04: qty 50

## 2014-11-04 MED ORDER — CEFAZOLIN SODIUM 1-5 GM-% IV SOLN
1.0000 g | Freq: Once | INTRAVENOUS | Status: AC
  Administered 2014-11-04: 1 g via INTRAVENOUS

## 2014-11-04 SURGICAL SUPPLY — 16 items
BALLN ULTRVRSE 8X60X75C (BALLOONS) ×4
BALLOON ULTRVRSE 8X60X75C (BALLOONS) ×2 IMPLANT
CATH BEACON 5.038 65CM KMP-01 (CATHETERS) ×4 IMPLANT
CATH KUMPE (CATHETERS) ×2
CATH SLIP 5FR .038X65 KMP (CATHETERS) ×2
CATH SLIP 5FR 0.38 X 40 KMP (CATHETERS) ×2 IMPLANT
DRAPE BRACHIAL (DRAPES) ×4 IMPLANT
GUIDEWIRE ANGLED .035 180CM (WIRE) ×4 IMPLANT
KIT THROMB PERC PTD (MISCELLANEOUS) ×4 IMPLANT
NEEDLE ENTRY 21GA 7CM ECHOTIP (NEEDLE) ×4 IMPLANT
PACK ANGIOGRAPHY (CUSTOM PROCEDURE TRAY) ×4 IMPLANT
SET INTRO CAPELLA COAXIAL (SET/KITS/TRAYS/PACK) ×4 IMPLANT
SHEATH BRITE TIP 6FRX5.5 (SHEATH) ×4 IMPLANT
SUT MNCRL AB 4-0 PS2 18 (SUTURE) ×4 IMPLANT
TOWEL OR 17X26 4PK STRL BLUE (TOWEL DISPOSABLE) ×4 IMPLANT
WIRE MAGIC TOR.035 180C (WIRE) ×4 IMPLANT

## 2014-11-04 NOTE — Op Note (Signed)
[name]  Percutaneous Study/Intervention Procedural Note   Date of Surgery: 11/04/2014  Surgeon:Krysteena Stalker, Dolores Lory   Pre-operative Diagnosis: Thrombosis right arm hero graft; superior vena cava syndrome; end-stage renal disease requiring hemodialysis Post-operative diagnosis:  Same  Procedure(s) Performed:  1.  Contrast injection right arm hero graft  2.  The chemical thrombectomy right arm hero graft with Trerotola device  3.  Angioplasty to 8 mm right arm hero graft midportion of graft  4.  Ultrasound-guided access to left brachial vein  5.  Introduction catheter into superior vena cava left brachial vein  6.  Left arm venography with central venogram   Anesthesia: Conscious sedation  Sheath:  1. 6 French sheath antegrade direction right arm hero graft                 2. 6 French sheath retrograde direction right arm hero graft                 3. 5 French micro-sheath left brachial vein  Contrast: 50 cc  Fluoroscopy Time: 6.1 minutes  Indications:  Patient presents with multiple episodes of thrombosis of the right arm hero graft. He has missed one dialysis session. He denies right arm pain. No fever chills.  Procedure:  [name] a 49 y.o. male who was identified and appropriate procedural time out was performed.  The patient was then placed supine on the table and the right arm prepped and draped in the usual sterile fashion.  Access was then obtained to the right arm hero graft in an antegrade direction using a micropuncture needle followed by microwire in the micro-sheath J-wire followed by 6 [place] sheath. Small hand injection contrast confirmed thrombus within the hero graft and 4000 units of heparin was given.  The graft was then inspected under fluoroscopy to ensure there were no kinks fractures or other structural abnormalities. None were identified. Typical hero graft appears to be in the mid atrium. The Trerotola device was then advanced  through the antegrade sheath and multiple passes were made within the PTFE portion. This resulted in lysis of the majority of thrombus. The graft and silicon portion were then irrigated with copious amounts of heparinized saline.  1% lidocaine is obtained soft tissues overlying the graft at the level of the deltoid and in a retrograde fashion a 6 French sheath was started using a micropuncture kit as described above. Floppy Glidewire Kumpe catheter advanced out into the brachial artery and hand injection contrast used to demonstrate that the brachial artery is patent and the approximate location of the anastomosis. Trerotola device is then used to make multiple passes freeing and lysing thrombus from the arterial portion. By hand injection flow was now been reestablished to the graft.  There appears to be an irregularity at the leading edge of the stent in the arterial portion there is also an irregularity in the area surrounding the retrograde sheath. Therefore Magic torque wires introduced and an 8 x 60 [place] balloon was used and plasty both of these areas inflations are to approximate 14 atm for 1 minute with the balloon inflated initially reflux imaging is obtained of the arterial portion and the brachial artery verifying thrombolysis within the proximal graft. Once the antroplasty's are completed imaging is performed demonstrating rapid flow of contrast throughout the entire hero graft. There are no residual abnormalities there is patency of the graft. The patient is then positioned with his arm flexed and placed on his chest to  see if repositioning is causing a kink or other flow limitation of the graft however even with these maneuvers hand injection contrast demonstrates rapid flow of contrast throughout the system without evidence of any structural impingement or abnormality.  Pursestring sutures then placed around the sheath the sheaths are removed and light pressures held there no immediate  Complications  To patient initial images demonstrate a hero graft is in good position it appears intact there is thrombus located within the graft on the initial injection. Brachial artery is patent in its visualized portions. Mechanical thrombectomy is then performed with the Trerotola device successfully treating the graft and reestablish inflow. 2 areas of irregularity are treated with an 8 mm balloon inflations with an excellent result less than 5% residual stenosis.  Summary successful recanalization of the right arm hero graft.  Attention is then turned to left arm which is extended and then prepped and draped in a sterile fashion. Ultrasound was placed in a sterile sleeve. Brachial vein is then verified is echolucent and compressible indicating patency images recorded for permanent record and under real-time visualization access is obtained to the vein with a micropuncture needle microwire followed by micro-sheath. Hand injection contrast then used to demonstrate the veins of the left upper extremity. The axillary and subclavian veins appear patent and a floppy Glidewire is then introduced and subsequently the micro-sheath is exchanged for a Kumpe catheter which in conjunction with the wires negotiated into the central venous system. Magnified close-up imaging is obtained by hand injection. After review these images the Kumpe is pulled blood pressures held there no immediate Complications. New  Interpretation the brachial veins are patent within the left upper extremity. A confluence of the brachial veins fills a patent axillary vein as well as the subclavian. 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 The innominate occlusion does have some small collaterals. There does not appear to be a direct path into the superior vena cava.  Summary: With respect to possible future access in the left arm  there can be no certainty but given the size of his hemizygous the possibility for a traditional brachial axillary graft does exist however in discussion with the patient after the study I did inform him there been no way of knowing that this would yield adequate patency since we are relying on a large collateral as the outflow. I do not see the possibility of a hero graft from the left and given the problems we're having on the right I am not sure that hero would be a significant advantage.  Given the findings on thrombectomy I am surprised that his graft continues to clot I do not find any major reasons why it should last only a week or 2. He is currently on anticoagulation. Hypotension does not seem to be as much of a problem as it was in the past. I am concerned he could have heparin-induced thrombocytopenia and this would explain his frequent clotting the panel has been sent to the lab and the results will be obtained when available.  Disposition: Patient was taken to the recovery room in stable condition having tolerated the procedure well.  Angelicia Lessner, Dolores Lory 11/04/2014,4:55 PM

## 2014-11-04 NOTE — Discharge Instructions (Signed)

## 2014-11-04 NOTE — H&P (Signed)
[facility] Admission History & Physical  MRN : [id number]  CORTEZ COAST is a 49 y.o. (06-19-1965) male who presents with chief complaint of clotted right arm HERO graft.  History of Present Illness: Mr. Vanvleck presented to dialysis yesterday was found to have a clotted access. He was recently declotted approximately 2 weeks ago. This is the fourth or fifth time that he is being declotted. He has had extensive evaluation including angiography from the aortic arch to the graft itself does not demonstrate any abnormality or inflow disease. In the past the hero graft is noted to be in excellent position and free of any kinks or abnormalities. Because it appears his frequent episodes of thrombosis are secondary to cardiogenic causes he has been anticoagulated however this has not provided significant improvement in the durability of his access. He presents today for declot so he can continue his dialysis therapy. However he will also go on to undergo venography to plan the possibility of a new access.  Current Facility-Administered Medications  Medication Dose Route Frequency Provider Last Rate Last Dose  . 0.9 %  sodium chloride infusion   Intravenous Continuous Katha Cabal, MD      . ceFAZolin (ANCEF) IVPB 1 g/50 mL premix  1 g Intravenous Once Katha Cabal, MD        Past Medical History  Diagnosis Date  . Hypertension   . Orthostatic hypotension      2008  . Chest discomfort      2008  . Sinus tachycardia      2008, TSH normal  . Ejection fraction      65%, echo, 2008   /   EF 55-60%, echo, October 28, 2010  . Overweight(278.02)   . Syncope      positional after dialysis... 2008  . Aneurysm      Right arm fistula 3 aneurysms 2011,   plans to have a new procedure  . CHF (congestive heart failure)   . Leg pain   . Angina     occasional, last 6 mo ago  . Dialysis patient     M-W-F @ [facility]  . ESRD (end stage renal disease)     on hemodialysis M-W-F  [facility]    Past Surgical History  Procedure Laterality Date  . Right arm graft      for dyalisis  . Umbilical hernia repair    . Thrombectomy    . Arteriovenous graft placement    . Av fistula placement    . Thrombectomy w/ embolectomy  03/01/2011    Procedure: THROMBECTOMY ARTERIOVENOUS GORE-TEX GRAFT;  Surgeon: Elam Dutch, MD;  Location: [facility];  Service: Vascular;  Laterality: Right;  Attempted Thrombectomy of Old  Right Upper Arm Arteriovenous gortex Graft. Insertion of new Arteriovenous Graft using 54mm x 50cm Gortex Stretch graft.   . Insertion of dialysis catheter  03/01/2011    Procedure: INSERTION OF DIALYSIS CATHETER;  Surgeon: Elam Dutch, MD;  Location: [facility];  Service: Vascular;  Laterality: Left;  Exchange of Dialysis Catheter to 27cm 15Fr. Arrow Catheter  . Thrombectomy w/ embolectomy  07/11/2011    Procedure: THROMBECTOMY ARTERIOVENOUS GORE-TEX GRAFT;  Surgeon: Rosetta Posner, MD;  Location: [place];  Service: Vascular;  Laterality: Right;  [name] N/A 08/23/2011    Procedure: VENOGRAM;  Surgeon: [name], MD;  Location: [facility] CATH LAB;  Service: Cardiovascular;  Laterality: N/A;  . Peripheral vascular catheterization N/A 09/01/2014  Procedure: A/V Shuntogram/Fistulagram;  Surgeon: Algernon Huxley, MD;  Location: [place] CV LAB;  Service: Cardiovascular;  Laterality: N/A;  . Peripheral vascular catheterization Right 09/01/2014    Procedure: Thrombectomy;  Surgeon: Algernon Huxley, MD;  Location: [place] CV [facility];  Service: Cardiovascular;  Laterality: Right;  . Peripheral vascular catheterization Right 09/01/2014    Procedure: A/V Shunt Intervention;  Surgeon: Algernon Huxley, MD;  Location: [facility] [place] CV LAB;  Service: Cardiovascular;  Laterality: Right;  . Peripheral vascular catheterization N/A 09/17/2014    Procedure: A/V Shuntogram/Fistulagram;  Surgeon: Algernon Huxley, MD;  Location: [place] CV LAB;  Service: Cardiovascular;  Laterality: N/A;  . Peripheral  vascular catheterization Right 10/17/2014    Procedure: Thrombectomy;  Surgeon: Katha Cabal, MD;  Location: [place] CV LAB;  Service: Cardiovascular;  Laterality: Right;  . Peripheral vascular catheterization N/A 10/17/2014    Procedure: A/V Shuntogram/Fistulagram;  Surgeon: Katha Cabal, MD;  Location: [place] CV LAB;  Service: Cardiovascular;  Laterality: N/A;  . Peripheral vascular catheterization N/A 10/17/2014    Procedure: A/V Shunt Intervention;  Surgeon: Katha Cabal, MD;  Location: [place] [facility];  Service: Cardiovascular;  Laterality: N/A;    Social History History  Substance Use Topics  . Smoking status: Former Smoker    Types: Cigarettes    Quit date: 04/18/1994  . Smokeless tobacco: Current User    Types: Snuff     Comment: dips 1/2 snuff per day times 25 years  . Alcohol Use: No    Family History Family History  Problem Relation Age of Onset  . Hypertension Mother   . Heart disease Mother   . Hypertension Father   . Heart disease Father    no family history of porphyria or autoimmune disease  No Known Allergies   REVIEW OF SYSTEMS (Negative unless checked)  Constitutional: [] Weight loss  [] Fever  [] Chills Cardiac: [] Chest pain   [] Chest pressure   [] Palpitations   [] Shortness of breath when laying flat   [] Shortness of breath at rest   [] Shortness of breath with exertion. Vascular:  [] Pain in legs with walking   [] Pain in legs at rest   [] Pain in legs when laying flat   [] Claudication   [] Pain in feet when walking  [] Pain in feet at rest  [] Pain in feet when laying flat   [] History of DVT   [] Phlebitis   [] Swelling in legs   [] Varicose veins   [] Non-healing ulcers Pulmonary:   [] Uses home oxygen   [] Productive cough   [] Hemoptysis   [] Wheeze  [] COPD   [] Asthma Neurologic:  [] Dizziness  [] Blackouts   [] Seizures   [] History of stroke   [] History of TIA  [] Aphasia   [] Temporary blindness   [] Dysphagia   [] Weakness or numbness in arms    [] Weakness or numbness in legs Musculoskeletal:  [] Arthritis   [] Joint swelling   [] Joint pain   [] Low back pain Hematologic:  [] Easy bruising  [] Easy bleeding   [] Hypercoagulable state   [] Anemic  [] Hepatitis Gastrointestinal:  [] Blood in stool   [] Vomiting blood  [] Gastroesophageal reflux/heartburn   [] Difficulty swallowing. Genitourinary:  [] Chronic kidney disease   [] Difficult urination  [] Frequent urination  [] Burning with urination   [] Blood in urine Skin:  [] Rashes   [] Ulcers   [] Wounds Psychological:  [] History of anxiety   []  History of major depression.  Physical Examination  Filed Vitals:   11/04/14 1120  BP: 132/92  Pulse: 81  Temp: 97.8 F (  36.6 C)  Resp: 18  Height: 6' (1.829 m)  Weight: 338 lb (153.316 kg)  SpO2: 97%   Body mass index is 45.83 kg/(m^2). Gen: WD/WN, NAD Head: [place]/AT, No temporalis wasting. Prominent temp pulse not noted. Ear/Nose/Throat: Hearing grossly intact, nares w/o erythema or drainage, oropharynx w/o Erythema/Exudate, Mallampati class IV Eyes: PERRLA, EOMI.  Neck: Supple, no nuchal rigidity.  No bruit or JVD.  Pulmonary:  Good air movement, clear to auscultation bilaterally, no use of accessory muscles.  Cardiac: RRR, normal S1, S2, no Murmurs, rubs or gallops. Vascular: Right arm hero graft no thrill no bruit Gastrointestinal: soft, non-tender/non-distended. No guarding/reflex.  Musculoskeletal: M/S 5/5 throughout.  Extremities without ischemic changes.  No deformity or atrophy.  Neurologic: CN 2-12 intact. Pain and light touch intact in extremities.  Symmetrical.  Speech is fluent. Motor exam as listed above. Psychiatric: Judgment intact, Mood & affect appropriate for pt's clinical situation. Dermatologic: No rashes or ulcers noted.  No cellulitis or open wounds. Lymph : No Cervical, Axillary, or Inguinal lymphadenopathy.  Diagnostic Studies None    CBC Lab Results  Component Value Date   WBC 7.5 11/13/2013   HGB 11.3* 11/13/2013    HCT 36.2* 11/13/2013   MCV 77* 11/13/2013   PLT 211 11/13/2013    BMET    Component Value Date/Time   NA 137 11/13/2013 0408   NA 136 04/06/2012 1426   K 4.1 11/13/2013 0408   K 5.0 08/08/2012 1430   CL 98 11/13/2013 0408   CL 92* 04/06/2012 1426   CO2 25 11/13/2013 0408   CO2 22 04/06/2012 1426   GLUCOSE 64* 11/13/2013 0408   GLUCOSE 87 04/06/2012 1426   BUN 68* 11/13/2013 0408   BUN 78* 04/06/2012 1426   CREATININE 16.45* 11/13/2013 0408   CREATININE 17.82* 04/06/2012 1426   CALCIUM 6.8* 11/13/2013 0408   CALCIUM 8.6 04/06/2012 1426   GFRNONAA 3* 11/13/2013 0408   GFRNONAA 3* 04/06/2012 1426   GFRAA 3* 11/13/2013 0408   GFRAA 3* 04/06/2012 1426   CrCl cannot be calculated (Patient has no serum creatinine result on file.).  COAG Lab Results  Component Value Date   INR 1.05 08/08/2012   INR 0.98 06/01/2011   INR 1.04 03/01/2011    Assessment/Plan 1. Complications of dialysis access with thrombosis of graft. Patient will undergo thrombectomy saline can maintain his dialysis therapy. As noted above he will also undergo venography of the left arm so that we may begin planning for a new access as this one does not appear to be reliable.  2. End-stage renal disease requiring hemodialysis. Patient will continue dialysis as his life-sustaining therapy.  3. 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 He is already on maximal medical therapy.  4. COPD with asthma patient is receiving his inhalers he is on CPAP he will continue his home therapies without interruption.  5. Morbid obesity nutritional counseling has been offered.     Gaylia Kassel, Dolores Lory, MD  11/04/2014 12:14 PM

## 2014-11-05 ENCOUNTER — Encounter: Payer: Self-pay | Admitting: Vascular Surgery

## 2014-11-05 DIAGNOSIS — Z992 Dependence on renal dialysis: Secondary | ICD-10-CM | POA: Diagnosis not present

## 2014-11-05 DIAGNOSIS — E611 Iron deficiency: Secondary | ICD-10-CM | POA: Diagnosis not present

## 2014-11-05 DIAGNOSIS — N2581 Secondary hyperparathyroidism of renal origin: Secondary | ICD-10-CM | POA: Diagnosis not present

## 2014-11-05 DIAGNOSIS — D509 Iron deficiency anemia, unspecified: Secondary | ICD-10-CM | POA: Diagnosis not present

## 2014-11-05 DIAGNOSIS — N186 End stage renal disease: Secondary | ICD-10-CM | POA: Diagnosis not present

## 2014-11-06 DIAGNOSIS — N186 End stage renal disease: Secondary | ICD-10-CM | POA: Diagnosis not present

## 2014-11-06 DIAGNOSIS — N2581 Secondary hyperparathyroidism of renal origin: Secondary | ICD-10-CM | POA: Diagnosis not present

## 2014-11-06 DIAGNOSIS — E611 Iron deficiency: Secondary | ICD-10-CM | POA: Diagnosis not present

## 2014-11-06 DIAGNOSIS — Z992 Dependence on renal dialysis: Secondary | ICD-10-CM | POA: Diagnosis not present

## 2014-11-06 DIAGNOSIS — D509 Iron deficiency anemia, unspecified: Secondary | ICD-10-CM | POA: Diagnosis not present

## 2014-11-08 DIAGNOSIS — N186 End stage renal disease: Secondary | ICD-10-CM | POA: Diagnosis not present

## 2014-11-08 DIAGNOSIS — Z992 Dependence on renal dialysis: Secondary | ICD-10-CM | POA: Diagnosis not present

## 2014-11-08 DIAGNOSIS — D509 Iron deficiency anemia, unspecified: Secondary | ICD-10-CM | POA: Diagnosis not present

## 2014-11-08 DIAGNOSIS — E611 Iron deficiency: Secondary | ICD-10-CM | POA: Diagnosis not present

## 2014-11-08 DIAGNOSIS — N2581 Secondary hyperparathyroidism of renal origin: Secondary | ICD-10-CM | POA: Diagnosis not present

## 2014-11-11 DIAGNOSIS — N2581 Secondary hyperparathyroidism of renal origin: Secondary | ICD-10-CM | POA: Diagnosis not present

## 2014-11-11 DIAGNOSIS — N186 End stage renal disease: Secondary | ICD-10-CM | POA: Diagnosis not present

## 2014-11-11 DIAGNOSIS — Z992 Dependence on renal dialysis: Secondary | ICD-10-CM | POA: Diagnosis not present

## 2014-11-11 DIAGNOSIS — D509 Iron deficiency anemia, unspecified: Secondary | ICD-10-CM | POA: Diagnosis not present

## 2014-11-11 DIAGNOSIS — E611 Iron deficiency: Secondary | ICD-10-CM | POA: Diagnosis not present

## 2014-11-12 LAB — HIT PANEL (HEPARIN AB + SRA)
Heparin Induced Plt Ab: 0.052 OD (ref 0.000–0.400)
SRA .2 IU/mL UFH Ser-aCnc: 2 % (ref 0–20)
SRA, HIGH DOSE HEPARIN: 2 % (ref 0–20)

## 2014-11-13 DIAGNOSIS — N2581 Secondary hyperparathyroidism of renal origin: Secondary | ICD-10-CM | POA: Diagnosis not present

## 2014-11-13 DIAGNOSIS — E611 Iron deficiency: Secondary | ICD-10-CM | POA: Diagnosis not present

## 2014-11-13 DIAGNOSIS — Z992 Dependence on renal dialysis: Secondary | ICD-10-CM | POA: Diagnosis not present

## 2014-11-13 DIAGNOSIS — D509 Iron deficiency anemia, unspecified: Secondary | ICD-10-CM | POA: Diagnosis not present

## 2014-11-13 DIAGNOSIS — N186 End stage renal disease: Secondary | ICD-10-CM | POA: Diagnosis not present

## 2014-11-15 DIAGNOSIS — Z992 Dependence on renal dialysis: Secondary | ICD-10-CM | POA: Diagnosis not present

## 2014-11-15 DIAGNOSIS — E611 Iron deficiency: Secondary | ICD-10-CM | POA: Diagnosis not present

## 2014-11-15 DIAGNOSIS — D509 Iron deficiency anemia, unspecified: Secondary | ICD-10-CM | POA: Diagnosis not present

## 2014-11-15 DIAGNOSIS — N186 End stage renal disease: Secondary | ICD-10-CM | POA: Diagnosis not present

## 2014-11-15 DIAGNOSIS — N2581 Secondary hyperparathyroidism of renal origin: Secondary | ICD-10-CM | POA: Diagnosis not present

## 2014-11-16 DIAGNOSIS — Z992 Dependence on renal dialysis: Secondary | ICD-10-CM | POA: Diagnosis not present

## 2014-11-16 DIAGNOSIS — N186 End stage renal disease: Secondary | ICD-10-CM | POA: Diagnosis not present

## 2014-11-18 DIAGNOSIS — N186 End stage renal disease: Secondary | ICD-10-CM | POA: Diagnosis not present

## 2014-11-18 DIAGNOSIS — E611 Iron deficiency: Secondary | ICD-10-CM | POA: Diagnosis not present

## 2014-11-18 DIAGNOSIS — D509 Iron deficiency anemia, unspecified: Secondary | ICD-10-CM | POA: Diagnosis not present

## 2014-11-18 DIAGNOSIS — Z992 Dependence on renal dialysis: Secondary | ICD-10-CM | POA: Diagnosis not present

## 2014-11-18 DIAGNOSIS — N2581 Secondary hyperparathyroidism of renal origin: Secondary | ICD-10-CM | POA: Diagnosis not present

## 2014-11-20 DIAGNOSIS — N2581 Secondary hyperparathyroidism of renal origin: Secondary | ICD-10-CM | POA: Diagnosis not present

## 2014-11-20 DIAGNOSIS — E611 Iron deficiency: Secondary | ICD-10-CM | POA: Diagnosis not present

## 2014-11-20 DIAGNOSIS — D509 Iron deficiency anemia, unspecified: Secondary | ICD-10-CM | POA: Diagnosis not present

## 2014-11-20 DIAGNOSIS — Z992 Dependence on renal dialysis: Secondary | ICD-10-CM | POA: Diagnosis not present

## 2014-11-20 DIAGNOSIS — N186 End stage renal disease: Secondary | ICD-10-CM | POA: Diagnosis not present

## 2014-11-22 DIAGNOSIS — N186 End stage renal disease: Secondary | ICD-10-CM | POA: Diagnosis not present

## 2014-11-22 DIAGNOSIS — Z992 Dependence on renal dialysis: Secondary | ICD-10-CM | POA: Diagnosis not present

## 2014-11-22 DIAGNOSIS — E611 Iron deficiency: Secondary | ICD-10-CM | POA: Diagnosis not present

## 2014-11-22 DIAGNOSIS — D509 Iron deficiency anemia, unspecified: Secondary | ICD-10-CM | POA: Diagnosis not present

## 2014-11-22 DIAGNOSIS — N2581 Secondary hyperparathyroidism of renal origin: Secondary | ICD-10-CM | POA: Diagnosis not present

## 2014-11-26 ENCOUNTER — Ambulatory Visit
Admission: RE | Admit: 2014-11-26 | Discharge: 2014-11-26 | Disposition: A | Discharge status: Home / Self Care | Payer: Medicare Other | Source: Ambulatory Visit | Attending: Vascular Surgery | Admitting: Vascular Surgery

## 2014-11-26 ENCOUNTER — Encounter: Payer: Self-pay | Admitting: *Deleted

## 2014-11-26 ENCOUNTER — Encounter
Admission: RE | Disposition: A | Discharge status: Home / Self Care | Payer: Self-pay | Source: Ambulatory Visit | Attending: Vascular Surgery

## 2014-11-26 DIAGNOSIS — Z5309 Procedure and treatment not carried out because of other contraindication: Secondary | ICD-10-CM | POA: Diagnosis not present

## 2014-11-26 DIAGNOSIS — Z992 Dependence on renal dialysis: Secondary | ICD-10-CM | POA: Insufficient documentation

## 2014-11-26 DIAGNOSIS — T82868A Thrombosis of vascular prosthetic devices, implants and grafts, initial encounter: Secondary | ICD-10-CM | POA: Diagnosis not present

## 2014-11-26 DIAGNOSIS — N186 End stage renal disease: Secondary | ICD-10-CM | POA: Diagnosis not present

## 2014-11-26 DIAGNOSIS — Y838 Other surgical procedures as the cause of abnormal reaction of the patient, or of later complication, without mention of misadventure at the time of the procedure: Secondary | ICD-10-CM | POA: Insufficient documentation

## 2014-11-26 LAB — POTASSIUM (ARMC VASCULAR LAB ONLY): POTASSIUM (ARMC VASCULAR LAB): 4.3

## 2014-11-26 SURGERY — THROMBECTOMY
Anesthesia: Moderate Sedation

## 2014-11-26 MED ORDER — DEXTROSE 50 % IV SOLN: 0.5000 | Freq: Once | INTRAVENOUS | Status: DC | PRN

## 2014-11-26 MED ORDER — ONDANSETRON HCL 4 MG/2ML IJ SOLN
4.0000 mg | INTRAMUSCULAR | Status: DC | PRN
Start: 2014-11-26 — End: 2014-11-27

## 2014-11-26 MED ORDER — HYDROMORPHONE HCL 1 MG/ML IJ SOLN: 1.0000 mg | INTRAMUSCULAR | Status: DC | PRN

## 2014-11-26 MED ORDER — SODIUM CHLORIDE 0.9 % IV SOLN: INTRAVENOUS | Status: DC

## 2014-11-26 MED ORDER — HEPARIN (PORCINE) IN NACL 2-0.9 UNIT/ML-% IJ SOLN
INTRAMUSCULAR | Status: AC
  Filled 2014-11-26: qty 1000

## 2014-11-26 MED ORDER — CEFAZOLIN SODIUM 1-5 GM-% IV SOLN: 1.0000 g | Freq: Once | INTRAVENOUS | Status: DC

## 2014-11-26 MED ORDER — LIDOCAINE HCL (PF) 1 % IJ SOLN
INTRAMUSCULAR | Status: AC
  Filled 2014-11-26: qty 10

## 2014-11-26 NOTE — Progress Notes (Signed)
Called to inform dr dew pt took eliquis yesterday.

## 2014-11-27 ENCOUNTER — Ambulatory Visit
Admission: RE | Admit: 2014-11-27 | Discharge: 2014-11-27 | Disposition: A | Discharge status: Home / Self Care | Payer: Medicare Other | Source: Ambulatory Visit | Attending: Vascular Surgery | Admitting: Vascular Surgery

## 2014-11-27 ENCOUNTER — Encounter
Admission: RE | Disposition: A | Discharge status: Home / Self Care | Payer: Self-pay | Source: Ambulatory Visit | Attending: Vascular Surgery

## 2014-11-27 ENCOUNTER — Encounter: Payer: Self-pay | Admitting: *Deleted

## 2014-11-27 DIAGNOSIS — T82898A Other specified complication of vascular prosthetic devices, implants and grafts, initial encounter: Secondary | ICD-10-CM | POA: Diagnosis not present

## 2014-11-27 DIAGNOSIS — N186 End stage renal disease: Secondary | ICD-10-CM | POA: Insufficient documentation

## 2014-11-27 DIAGNOSIS — I509 Heart failure, unspecified: Secondary | ICD-10-CM | POA: Insufficient documentation

## 2014-11-27 DIAGNOSIS — E663 Overweight: Secondary | ICD-10-CM | POA: Diagnosis not present

## 2014-11-27 DIAGNOSIS — Z992 Dependence on renal dialysis: Secondary | ICD-10-CM | POA: Diagnosis not present

## 2014-11-27 DIAGNOSIS — Z87891 Personal history of nicotine dependence: Secondary | ICD-10-CM | POA: Insufficient documentation

## 2014-11-27 DIAGNOSIS — I120 Hypertensive chronic kidney disease with stage 5 chronic kidney disease or end stage renal disease: Secondary | ICD-10-CM | POA: Diagnosis not present

## 2014-11-27 DIAGNOSIS — T82858A Stenosis of vascular prosthetic devices, implants and grafts, initial encounter: Secondary | ICD-10-CM | POA: Diagnosis not present

## 2014-11-27 HISTORY — PX: PERIPHERAL VASCULAR CATHETERIZATION: SHX172C

## 2014-11-27 LAB — POTASSIUM (ARMC VASCULAR LAB ONLY): Potassium (ARMC vascular lab): 4.4

## 2014-11-27 SURGERY — A/V SHUNTOGRAM/FISTULAGRAM
Laterality: Right | Wound class: Clean

## 2014-11-27 MED ORDER — SODIUM CHLORIDE 0.9 % IV SOLN
INTRAVENOUS | Status: DC
Start: 2014-11-27 — End: 2014-11-27
  Administered 2014-11-27: 12:00:00 via INTRAVENOUS

## 2014-11-27 MED ORDER — FENTANYL CITRATE (PF) 100 MCG/2ML IJ SOLN
INTRAMUSCULAR | Status: DC | PRN
  Administered 2014-11-27 (×2): 50 ug via INTRAVENOUS

## 2014-11-27 MED ORDER — LIDOCAINE-EPINEPHRINE (PF) 1 %-1:200000 IJ SOLN
INTRAMUSCULAR | Status: AC
  Filled 2014-11-27: qty 30

## 2014-11-27 MED ORDER — IOHEXOL 300 MG/ML  SOLN
INTRAMUSCULAR | Status: DC | PRN
  Administered 2014-11-27: 25 mL via INTRAVENOUS

## 2014-11-27 MED ORDER — HEPARIN SODIUM (PORCINE) 1000 UNIT/ML IJ SOLN
INTRAMUSCULAR | Status: DC | PRN
  Administered 2014-11-27: 4000 [IU] via INTRAVENOUS

## 2014-11-27 MED ORDER — CEFAZOLIN SODIUM 1-5 GM-% IV SOLN
1.0000 g | Freq: Once | INTRAVENOUS | Status: AC
  Administered 2014-11-27: 1 g via INTRAVENOUS

## 2014-11-27 MED ORDER — MIDAZOLAM HCL 2 MG/2ML IJ SOLN
INTRAMUSCULAR | Status: DC | PRN
  Administered 2014-11-27 (×2): 2 mg via INTRAVENOUS
  Administered 2014-11-27: 1 mg via INTRAVENOUS

## 2014-11-27 MED ORDER — ALTEPLASE 2 MG IJ SOLR
INTRAMUSCULAR | Status: AC
  Filled 2014-11-27: qty 4

## 2014-11-27 MED ORDER — FENTANYL CITRATE (PF) 100 MCG/2ML IJ SOLN
INTRAMUSCULAR | Status: AC
  Filled 2014-11-27: qty 2

## 2014-11-27 MED ORDER — HEPARIN SODIUM (PORCINE) 1000 UNIT/ML IJ SOLN
INTRAMUSCULAR | Status: AC
  Filled 2014-11-27: qty 1

## 2014-11-27 MED ORDER — MIDAZOLAM HCL 5 MG/5ML IJ SOLN
INTRAMUSCULAR | Status: AC
  Filled 2014-11-27: qty 5

## 2014-11-27 MED ORDER — HEPARIN (PORCINE) IN NACL 2-0.9 UNIT/ML-% IJ SOLN
INTRAMUSCULAR | Status: AC
  Filled 2014-11-27: qty 500

## 2014-11-27 MED ORDER — HEPARIN (PORCINE) IN NACL 2-0.9 UNIT/ML-% IJ SOLN
INTRAMUSCULAR | Status: AC
  Filled 2014-11-27: qty 1000

## 2014-11-27 SURGICAL SUPPLY — 14 items
BALLN DORADO 7X60X80 (BALLOONS) ×4
BALLN ULTRVRSE 8X200X75 (BALLOONS) ×4
BALLOON DORADO 7X60X80 (BALLOONS) ×2 IMPLANT
BALLOON ULTRVRSE 8X200X75 (BALLOONS) ×2 IMPLANT
CATH EMBOLECTOMY 5FR (BALLOONS) ×4 IMPLANT
DEVICE INFLATION 20/30 (MISCELLANEOUS) ×4 IMPLANT
DEVICE SOLENT OMNI 120CM (CATHETERS) ×4 IMPLANT
DRAPE BRACHIAL (DRAPES) ×4 IMPLANT
KIT 5FR STIFF NT/TG (MISCELLANEOUS) ×4 IMPLANT
PACK ANGIOGRAPHY (CUSTOM PROCEDURE TRAY) ×4 IMPLANT
SHEATH BRITE TIP 6FRX5.5 (SHEATH) ×8 IMPLANT
SUT MON AB 4-0 PC3 18 (SUTURE) ×4 IMPLANT
TOWEL OR 17X26 4PK STRL BLUE (TOWEL DISPOSABLE) ×4 IMPLANT
WIRE MAGIC TOR.035 180C (WIRE) ×8 IMPLANT

## 2014-11-27 NOTE — H&P (Signed)
[facility] Admission History & Physical  MRN : [id number]  [name] [name] is a 49 y.o. (1965/08/23) male who presents with chief complaint of No chief complaint on file. [name]  History of Present Illness: Patient well known to [place] with end-stage renal disease presents back today with thrombosis of his right arm dialysis access. He feels well and has no specific complaint. He is here for an attempt at Salvage of his graft  No current facility-administered medications for this encounter.    Past Medical History  Diagnosis Date  . Hypertension   . Orthostatic hypotension      2008  . Chest discomfort      2008  . Sinus tachycardia      2008, TSH normal  . Ejection fraction      65%, echo, 2008   /   EF 55-60%, echo, October 28, 2010  . Overweight(278.02)   . Syncope      positional after dialysis... 2008  . Aneurysm      Right arm fistula 3 aneurysms 2011,   plans to have a new procedure  . CHF (congestive heart failure)   . Leg pain   . Angina     occasional, last 6 mo ago  . Dialysis patient     M-W-F @ [facility]  . ESRD (end stage renal disease)     on hemodialysis M-W-F [facility]    Past Surgical History  Procedure Laterality Date  . Right arm graft      for dyalisis  . Umbilical hernia repair    . Thrombectomy    . Arteriovenous graft placement    . Av fistula placement    . Thrombectomy w/ embolectomy  03/01/2011    Procedure: THROMBECTOMY ARTERIOVENOUS GORE-TEX GRAFT;  Surgeon: Elam Dutch, MD;  Location: [place];  Service: Vascular;  Laterality: Right;  Attempted Thrombectomy of Old  Right Upper Arm Arteriovenous gortex Graft. Insertion of new Arteriovenous Graft using 35mm x 50cm Gortex Stretch graft.   . Insertion of dialysis catheter  03/01/2011    Procedure: INSERTION OF DIALYSIS CATHETER;  Surgeon: Elam Dutch, MD;  Location: [place];  Service: Vascular;  Laterality: Left;  Exchange of Dialysis Catheter to 27cm 15Fr. Arrow Catheter  .  Thrombectomy w/ embolectomy  07/11/2011    Procedure: THROMBECTOMY ARTERIOVENOUS GORE-TEX GRAFT;  Surgeon: Rosetta Posner, MD;  Location: [place];  Service: Vascular;  Laterality: Right;  [name] N/A 08/23/2011    Procedure: VENOGRAM;  Surgeon: [name], MD;  Location: [facility] C[facility];  Service: Cardiovascular;  Laterality: N/A;  . Peripheral vascular catheterization N/A 09/01/2014    Procedure: A/V Shuntogram/Fistulagram;  Surgeon: Algernon Huxley, MD;  Location: [place] CV [facility];  Service: Cardiovascular;  Laterality: N/A;  . Peripheral vascular catheterization Right 09/01/2014    Procedure: Thrombectomy;  Surgeon: Algernon Huxley, MD;  Location: [place] [facility];  Service: Cardiovascular;  Laterality: Right;  . Peripheral vascular catheterization Right 09/01/2014    Procedure: A/V Shunt Intervention;  Surgeon: Algernon Huxley, MD;  Location: [place] [facility];  Service: Cardiovascular;  Laterality: Right;  . Peripheral vascular catheterization N/A 09/17/2014    Procedure: A/V Shuntogram/Fistulagram;  Surgeon: Algernon Huxley, MD;  Location: [place] [facility];  Service: Cardiovascular;  Laterality: N/A;  . Peripheral vascular catheterization Right 10/17/2014    Procedure: Thrombectomy;  Surgeon: Katha Cabal, MD;  Location: [place] [facility];  Service: Cardiovascular;  Laterality: Right;  . Peripheral vascular catheterization N/A 10/17/2014    Procedure: A/V Shuntogram/Fistulagram;  Surgeon: Katha Cabal, MD;  Location: [place] [facility];  Service: Cardiovascular;  Laterality: N/A;  . Peripheral vascular catheterization N/A 10/17/2014    Procedure: A/V Shunt Intervention;  Surgeon: Katha Cabal, MD;  Location: [place] [facility];  Service: Cardiovascular;  Laterality: N/A;  . Peripheral vascular catheterization Right 11/04/2014    Procedure: Thrombectomy;  Surgeon: Katha Cabal, MD;  Location: [place] [facility];  Service: Cardiovascular;  Laterality: Right;  . Peripheral  vascular catheterization Left 11/04/2014    Procedure: Visceral Venography;  Surgeon: Katha Cabal, MD;  Location: [place] [facility];  Service: Cardiovascular;  Laterality: Left;    Social History Social History  Substance Use Topics  . Smoking status: Former Smoker    Types: Cigarettes    Quit date: 04/18/1994  . Smokeless tobacco: Current User    Types: Snuff     Comment: dips 1/2 snuff per day times 25 years  . Alcohol Use: No  No IV drug use  Family History Family History  Problem Relation Age of Onset  . Hypertension Mother   . Heart disease Mother   . Hypertension Father   . Heart disease Father   No porphyrias, autoimmune diseases or bleeding disorders  No Known Allergies   REVIEW OF SYSTEMS (Negative unless checked)  Constitutional: [] Weight loss  [] Fever  [] Chills Cardiac: [] Chest pain   [] Chest pressure   [] Palpitations   [] Shortness of breath when laying flat   [] Shortness of breath at rest   [] Shortness of breath with exertion. Vascular:  [] Pain in legs with walking   [] Pain in legs at rest   [] Pain in legs when laying flat   [] Claudication   [] Pain in feet when walking  [] Pain in feet at rest  [] Pain in feet when laying flat   [] History of DVT   [] Phlebitis   [] Swelling in legs   [] Varicose veins   [] Non-healing ulcers Pulmonary:   [] Uses home oxygen   [] Productive cough   [] Hemoptysis   [] Wheeze  [] COPD   [] Asthma Neurologic:  [] Dizziness  [] Blackouts   [] Seizures   [] History of stroke   [] History of TIA  [] Aphasia   [] Temporary blindness   [] Dysphagia   [] Weakness or numbness in arms   [] Weakness or numbness in legs Musculoskeletal:  [] Arthritis   [] Joint swelling   [] Joint pain   [] Low back pain Hematologic:  [] Easy bruising  [] Easy bleeding   [] Hypercoagulable state   [] Anemic  [] Hepatitis Gastrointestinal:  [] Blood in stool   [] Vomiting blood  [] Gastroesophageal reflux/heartburn   [] Difficulty swallowing. Genitourinary:  [x] Chronic kidney disease    [] Difficult urination  [] Frequent urination  [] Burning with urination   [] Blood in urine Skin:  [] Rashes   [] Ulcers   [] Wounds Psychological:  [] History of anxiety   []  History of major depression.  Physical Examination  Filed Vitals:   11/27/14 1121  BP: 108/75  Pulse: 85  Height: 6' (1.829 m)  Weight: 337 lb (152.862 kg)  SpO2: 97%   Body mass index is 45.7 kg/(m^2). Gen: WD/WN, NAD Head: [place]/AT, No temporalis wasting. Prominent temp pulse not noted. Ear/Nose/Throat: Hearing grossly intact, nares w/o erythema or drainage, oropharynx w/o Erythema/Exudate,  Eyes: PERRLA, EOMI.  Neck: Supple, no nuchal rigidity.  No bruit or JVD.  Pulmonary:  Good air movement, clear to auscultation bilaterally, no use of accessory muscles.  Cardiac: RRR, normal S1, S2,  no Murmurs, rubs or gallops. Vascular: No thrill or bruit in right arm HeRO graft Vessel Right Left  Radial Palpable Palpable                                   Gastrointestinal: soft, non-tender/non-distended. No guarding/reflex.  Musculoskeletal: M/S 5/5 throughout.  Extremities without ischemic changes.  No deformity or atrophy.  Neurologic: CN 2-12 intact. Pain and light touch intact in extremities.  Symmetrical.  Speech is fluent. Motor exam as listed above. Psychiatric: Judgment intact, Mood & affect appropriate for pt's clinical situation. Dermatologic: No rashes or ulcers noted.  No cellulitis or open wounds. Lymph : No Cervical, Axillary, or Inguinal lymphadenopathy.     CBC Lab Results  Component Value Date   WBC 7.5 11/13/2013   HGB 11.3* 11/13/2013   HCT 36.2* 11/13/2013   MCV 77* 11/13/2013   PLT 211 11/13/2013    BMET    Component Value Date/Time   NA 137 11/13/2013 0408   NA 136 04/06/2012 1426   K 4.1 11/13/2013 0408   K 5.0 08/08/2012 1430   CL 98 11/13/2013 0408   CL 92* 04/06/2012 1426   CO2 25 11/13/2013 0408   CO2 22 04/06/2012 1426   GLUCOSE 64* 11/13/2013 0408   GLUCOSE 87  04/06/2012 1426   BUN 68* 11/13/2013 0408   BUN 78* 04/06/2012 1426   CREATININE 16.45* 11/13/2013 0408   CREATININE 17.82* 04/06/2012 1426   CALCIUM 6.8* 11/13/2013 0408   CALCIUM 8.6 04/06/2012 1426   GFRNONAA 3* 11/13/2013 0408   GFRNONAA 3* 04/06/2012 1426   GFRAA 3* 11/13/2013 0408   GFRAA 3* 04/06/2012 1426   CrCl cannot be calculated (Patient has no serum creatinine result on file.).  COAG Lab Results  Component Value Date   INR 1.05 08/08/2012   INR 0.98 06/01/2011   INR 1.04 03/01/2011      Assessment/Plan 1. 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  Needs this opened for his access to continue HD 2. ESRD: needs HD and will attempt to open graft   Charlei Ramsaran, MD  11/27/2014 11:38 AM

## 2014-11-27 NOTE — Discharge Instructions (Signed)
AV Fistula, Care After Refer to this sheet in the next few weeks. These instructions provide you with information on caring for yourself after your procedure. Your caregiver may also give you more specific instructions. Your treatment has been planned according to current medical practices, but problems sometimes occur. Call your caregiver if you have any problems or questions after your procedure. HOME CARE INSTRUCTIONS   Do not drive a car or take public transportation alone.  Do not drink alcohol.  Only take medicine that has been prescribed by your caregiver.  Do not sign important papers or make important decisions.  Have a responsible person with you.  Ask your caregiver to show you how to check your access at home for a vibration (called a "thrill") or for a sound (called a "bruit" pronounced brew-ee).  Your vein will need time to enlarge and mature so needles can be inserted for dialysis. Follow your caregiver's instructions about what you need to do to make this happen.  Keep dressings clean and dry.  Keep the arm elevated above your heart. Use a pillow.  Rest.  Use the arm as usual for all activities.  Have the stitches or tape closures removed in 10 to 14 days, or as directed by your caregiver.  Do not sleep or lie on the area of the fistula or that arm. This may decrease or stop the blood flow through your fistula.  Do not allow blood pressures to be taken on this arm.  Do not allow blood drawing to be done from the graft.  Do not wear tight clothing around the access site or on the arm.  Avoid lifting heavy objects with the arm that has the fistula.  Do not use creams or lotions over the access site. SEEK MEDICAL CARE IF:   You have a fever.  You have swelling around the fistula that gets worse, or you have new pain.  You have unusual bleeding at the fistula site or from any other area.  You have pus or other drainage at the fistula site.  You have skin  redness or red streaking on the skin around, above, or below the fistula site.  Your access site feels warm.  You have any flu-like symptoms. SEEK IMMEDIATE MEDICAL CARE IF:   You have pain, numbness, or an unusual pale skin on the hand or on the side of your fistula.  You have dizziness or weakness that you have not had before.  You have shortness of breath.  You have chest pain.  Your fistula disconnects or breaks, and there is bleeding that cannot be easily controlled. Call for local emergency medical help. Do not try to drive yourself to the hospital. MAKE SURE YOU  Understand these instructions.  Will watch your condition.  Will get help right away if you are not doing well or get worse. Document Released: 04/04/2005 Document Revised: 08/19/2013 Document Reviewed: 09/22/2010 [facility] Patient Information 2015 [place], [place]. This information is not intended to replace advice given to you by your health care provider. Make sure you discuss any questions you have with your health care provider.

## 2014-11-27 NOTE — Op Note (Signed)
[facility]    OPERATIVE NOTE   PROCEDURE: 1.  right arm HeRO graft cannulation under ultrasound guidance in both a retrograde and then antegrade fashion crossing 2.  right arm shuntogram and central venogram 3.  Catheter directed thrombolysis with 4 mg of TPA delivered with the AngioJet AVX catheter 4.  Fogarty embolectomy for residual arterial plug 5.  Percutaneous transluminal angioplasty of arterial anastomosis with 7 mm diameter by 6 cm length high pressure angioplasty balloon 6.  Percutaneous transluminal angioplasty of the access sites and distal PTFE portions of the graft with an 8 mm diameter by 20 cm length angioplasty balloon  PRE-OPERATIVE DIAGNOSIS: 1. ESRD 2.  Thrombosed right arm HeRO graft  POST-OPERATIVE DIAGNOSIS: same as above   SURGEON: Leotis Pain, MD  ANESTHESIA: local with MCS  ESTIMATED BLOOD LOSS: 25 cc  FINDING(S): 1. Thrombosed HeRO graft, opened with intervention  SPECIMEN(S):  None  CONTRAST: 40 cc  INDICATIONS: Patient is a 49 year old male who presents with a thrombosed right arm HeRO graft.  The patient is scheduled for an attempted declot and shuntogram.  The patient is aware the risks include but are not limited to: bleeding, infection, thrombosis of the cannulated access, and possible anaphylactic reaction to the contrast.  The patient is aware of the risks of the procedure and elects to proceed forward.  DESCRIPTION: After full informed written consent was obtained, the patient was brought back to the angiography suite and placed supine upon the angiography table.  The patient was connected to monitoring equipment.  The  right arm was prepped and draped in the standard fashion for a percutaneous access intervention.  Under ultrasound guidance, the right arm HeRO graft was cannulated with a micropuncture needle under direct ultrasound guidance due to the pulseless nature of the graft in both an antegrade and a retrograde  fashion crossing, and permanent images were performed.  The microwire was advanced and the needle was exchanged for the a microsheath.  I then upsized to a 6 Fr Sheath and imaging was performed.  Hand injections were completed to image the access including the central venous system. This demonstrated no flow within the HeRO graft and extensive thrombosis.  Based on the images, this patient will need extensive treatment to salvage the graft. I then gave the patient 4000 units of intravenous heparin.  I then placed a Magic torque wire into the brachial artery from the retrograde sheath and into the right atrium from the antegrade sheath. 4 mg of TPA were deployed throughout the entirety of the PTFE portion of the graft and into the brachial artery at the anastomosis. This was allowed to dwell for 10-15 minutes. This uncovered a diffusely narrowed and thrombosed graft and a residual arterial plug which was nearly occlusive was also seen at the arterial anastomosis. An attempt to clear the arterial plug was done with 3 passes of the Fogarty embolectomy balloon. Flow-limiting arterial plug remained, and I elected to treat this lesion with a 7 mm diameter by 6 cm length high pressure angioplasty balloon. This resulted in resolution of the arterial plug, and clearance of the arterial side of the graft. The arterial outflow was seen to be intact distal to the graft on these images. The retrograde sheath was removed as a 4-0 Monocryl pursestring suture was placed. I then turned my attention to the thrombus in the access sites and distal graft. I then elected to treat this with an 8 mm diameter by 20 cm length  angioplasty balloon. This resulted in clearance of the thrombus and restoration of normal flow within the graft without any significant residual stenosis or thrombosis seen. Clinically, there was good palpable flow within the graft.    Based on the completion imaging, no further intervention is necessary.  The wire  and balloon were removed from the sheath.  A 4-0 Monocryl purse-string suture was sewn around the sheath.  The sheath was removed while tying down the suture.  A sterile bandage was applied to the puncture site.  COMPLICATIONS: None  CONDITION: Stable   Goro Wenrick 11/27/2014 2:19 PM

## 2014-11-28 DIAGNOSIS — E611 Iron deficiency: Secondary | ICD-10-CM | POA: Diagnosis not present

## 2014-11-28 DIAGNOSIS — N2581 Secondary hyperparathyroidism of renal origin: Secondary | ICD-10-CM | POA: Diagnosis not present

## 2014-11-28 DIAGNOSIS — D509 Iron deficiency anemia, unspecified: Secondary | ICD-10-CM | POA: Diagnosis not present

## 2014-11-28 DIAGNOSIS — Z992 Dependence on renal dialysis: Secondary | ICD-10-CM | POA: Diagnosis not present

## 2014-11-28 DIAGNOSIS — N186 End stage renal disease: Secondary | ICD-10-CM | POA: Diagnosis not present

## 2014-11-29 DIAGNOSIS — N186 End stage renal disease: Secondary | ICD-10-CM | POA: Diagnosis not present

## 2014-11-29 DIAGNOSIS — N2581 Secondary hyperparathyroidism of renal origin: Secondary | ICD-10-CM | POA: Diagnosis not present

## 2014-11-29 DIAGNOSIS — D509 Iron deficiency anemia, unspecified: Secondary | ICD-10-CM | POA: Diagnosis not present

## 2014-11-29 DIAGNOSIS — Z992 Dependence on renal dialysis: Secondary | ICD-10-CM | POA: Diagnosis not present

## 2014-11-29 DIAGNOSIS — E611 Iron deficiency: Secondary | ICD-10-CM | POA: Diagnosis not present

## 2014-12-02 ENCOUNTER — Encounter: Payer: Self-pay | Admitting: Vascular Surgery

## 2014-12-02 DIAGNOSIS — E611 Iron deficiency: Secondary | ICD-10-CM | POA: Diagnosis not present

## 2014-12-02 DIAGNOSIS — N2581 Secondary hyperparathyroidism of renal origin: Secondary | ICD-10-CM | POA: Diagnosis not present

## 2014-12-02 DIAGNOSIS — N186 End stage renal disease: Secondary | ICD-10-CM | POA: Diagnosis not present

## 2014-12-02 DIAGNOSIS — Z992 Dependence on renal dialysis: Secondary | ICD-10-CM | POA: Diagnosis not present

## 2014-12-02 DIAGNOSIS — D509 Iron deficiency anemia, unspecified: Secondary | ICD-10-CM | POA: Diagnosis not present

## 2014-12-04 DIAGNOSIS — D509 Iron deficiency anemia, unspecified: Secondary | ICD-10-CM | POA: Diagnosis not present

## 2014-12-04 DIAGNOSIS — N186 End stage renal disease: Secondary | ICD-10-CM | POA: Diagnosis not present

## 2014-12-04 DIAGNOSIS — E611 Iron deficiency: Secondary | ICD-10-CM | POA: Diagnosis not present

## 2014-12-04 DIAGNOSIS — N2581 Secondary hyperparathyroidism of renal origin: Secondary | ICD-10-CM | POA: Diagnosis not present

## 2014-12-04 DIAGNOSIS — Z992 Dependence on renal dialysis: Secondary | ICD-10-CM | POA: Diagnosis not present

## 2014-12-06 DIAGNOSIS — D509 Iron deficiency anemia, unspecified: Secondary | ICD-10-CM | POA: Diagnosis not present

## 2014-12-06 DIAGNOSIS — N186 End stage renal disease: Secondary | ICD-10-CM | POA: Diagnosis not present

## 2014-12-06 DIAGNOSIS — N2581 Secondary hyperparathyroidism of renal origin: Secondary | ICD-10-CM | POA: Diagnosis not present

## 2014-12-06 DIAGNOSIS — Z992 Dependence on renal dialysis: Secondary | ICD-10-CM | POA: Diagnosis not present

## 2014-12-06 DIAGNOSIS — E611 Iron deficiency: Secondary | ICD-10-CM | POA: Diagnosis not present

## 2014-12-09 DIAGNOSIS — D509 Iron deficiency anemia, unspecified: Secondary | ICD-10-CM | POA: Diagnosis not present

## 2014-12-09 DIAGNOSIS — N186 End stage renal disease: Secondary | ICD-10-CM | POA: Diagnosis not present

## 2014-12-09 DIAGNOSIS — Z992 Dependence on renal dialysis: Secondary | ICD-10-CM | POA: Diagnosis not present

## 2014-12-09 DIAGNOSIS — E611 Iron deficiency: Secondary | ICD-10-CM | POA: Diagnosis not present

## 2014-12-09 DIAGNOSIS — N2581 Secondary hyperparathyroidism of renal origin: Secondary | ICD-10-CM | POA: Diagnosis not present

## 2014-12-11 DIAGNOSIS — D509 Iron deficiency anemia, unspecified: Secondary | ICD-10-CM | POA: Diagnosis not present

## 2014-12-11 DIAGNOSIS — N186 End stage renal disease: Secondary | ICD-10-CM | POA: Diagnosis not present

## 2014-12-11 DIAGNOSIS — Z992 Dependence on renal dialysis: Secondary | ICD-10-CM | POA: Diagnosis not present

## 2014-12-11 DIAGNOSIS — N2581 Secondary hyperparathyroidism of renal origin: Secondary | ICD-10-CM | POA: Diagnosis not present

## 2014-12-11 DIAGNOSIS — E611 Iron deficiency: Secondary | ICD-10-CM | POA: Diagnosis not present

## 2014-12-13 DIAGNOSIS — N186 End stage renal disease: Secondary | ICD-10-CM | POA: Diagnosis not present

## 2014-12-13 DIAGNOSIS — D509 Iron deficiency anemia, unspecified: Secondary | ICD-10-CM | POA: Diagnosis not present

## 2014-12-13 DIAGNOSIS — N2581 Secondary hyperparathyroidism of renal origin: Secondary | ICD-10-CM | POA: Diagnosis not present

## 2014-12-13 DIAGNOSIS — E611 Iron deficiency: Secondary | ICD-10-CM | POA: Diagnosis not present

## 2014-12-13 DIAGNOSIS — Z992 Dependence on renal dialysis: Secondary | ICD-10-CM | POA: Diagnosis not present

## 2014-12-16 DIAGNOSIS — N186 End stage renal disease: Secondary | ICD-10-CM | POA: Diagnosis not present

## 2014-12-16 DIAGNOSIS — E611 Iron deficiency: Secondary | ICD-10-CM | POA: Diagnosis not present

## 2014-12-16 DIAGNOSIS — D509 Iron deficiency anemia, unspecified: Secondary | ICD-10-CM | POA: Diagnosis not present

## 2014-12-16 DIAGNOSIS — N2581 Secondary hyperparathyroidism of renal origin: Secondary | ICD-10-CM | POA: Diagnosis not present

## 2014-12-16 DIAGNOSIS — Z992 Dependence on renal dialysis: Secondary | ICD-10-CM | POA: Diagnosis not present

## 2014-12-17 DIAGNOSIS — N186 End stage renal disease: Secondary | ICD-10-CM | POA: Diagnosis not present

## 2014-12-17 DIAGNOSIS — Z992 Dependence on renal dialysis: Secondary | ICD-10-CM | POA: Diagnosis not present

## 2014-12-18 DIAGNOSIS — Z23 Encounter for immunization: Secondary | ICD-10-CM | POA: Diagnosis not present

## 2014-12-18 DIAGNOSIS — N186 End stage renal disease: Secondary | ICD-10-CM | POA: Diagnosis not present

## 2014-12-18 DIAGNOSIS — Z992 Dependence on renal dialysis: Secondary | ICD-10-CM | POA: Diagnosis not present

## 2014-12-18 DIAGNOSIS — N2581 Secondary hyperparathyroidism of renal origin: Secondary | ICD-10-CM | POA: Diagnosis not present

## 2014-12-20 DIAGNOSIS — Z992 Dependence on renal dialysis: Secondary | ICD-10-CM | POA: Diagnosis not present

## 2014-12-20 DIAGNOSIS — N186 End stage renal disease: Secondary | ICD-10-CM | POA: Diagnosis not present

## 2014-12-20 DIAGNOSIS — Z23 Encounter for immunization: Secondary | ICD-10-CM | POA: Diagnosis not present

## 2014-12-20 DIAGNOSIS — N2581 Secondary hyperparathyroidism of renal origin: Secondary | ICD-10-CM | POA: Diagnosis not present

## 2014-12-23 DIAGNOSIS — Z992 Dependence on renal dialysis: Secondary | ICD-10-CM | POA: Diagnosis not present

## 2014-12-23 DIAGNOSIS — N2581 Secondary hyperparathyroidism of renal origin: Secondary | ICD-10-CM | POA: Diagnosis not present

## 2014-12-23 DIAGNOSIS — N186 End stage renal disease: Secondary | ICD-10-CM | POA: Diagnosis not present

## 2014-12-23 DIAGNOSIS — Z23 Encounter for immunization: Secondary | ICD-10-CM | POA: Diagnosis not present

## 2014-12-25 DIAGNOSIS — Z23 Encounter for immunization: Secondary | ICD-10-CM | POA: Diagnosis not present

## 2014-12-25 DIAGNOSIS — N2581 Secondary hyperparathyroidism of renal origin: Secondary | ICD-10-CM | POA: Diagnosis not present

## 2014-12-25 DIAGNOSIS — N186 End stage renal disease: Secondary | ICD-10-CM | POA: Diagnosis not present

## 2014-12-25 DIAGNOSIS — Z992 Dependence on renal dialysis: Secondary | ICD-10-CM | POA: Diagnosis not present

## 2014-12-30 ENCOUNTER — Other Ambulatory Visit: Payer: Self-pay | Admitting: Vascular Surgery

## 2014-12-31 ENCOUNTER — Encounter: Payer: Self-pay | Admitting: *Deleted

## 2014-12-31 ENCOUNTER — Ambulatory Visit
Admission: RE | Admit: 2014-12-31 | Discharge: 2014-12-31 | Disposition: A | Discharge status: Home / Self Care | Payer: Medicare Other | Source: Ambulatory Visit | Attending: Vascular Surgery | Admitting: Vascular Surgery

## 2014-12-31 ENCOUNTER — Encounter
Admission: RE | Disposition: A | Discharge status: Home / Self Care | Payer: Self-pay | Source: Ambulatory Visit | Attending: Vascular Surgery

## 2014-12-31 DIAGNOSIS — Z87891 Personal history of nicotine dependence: Secondary | ICD-10-CM | POA: Diagnosis not present

## 2014-12-31 DIAGNOSIS — M79606 Pain in leg, unspecified: Secondary | ICD-10-CM | POA: Diagnosis not present

## 2014-12-31 DIAGNOSIS — I509 Heart failure, unspecified: Secondary | ICD-10-CM | POA: Insufficient documentation

## 2014-12-31 DIAGNOSIS — N186 End stage renal disease: Secondary | ICD-10-CM | POA: Insufficient documentation

## 2014-12-31 DIAGNOSIS — T82868A Thrombosis of vascular prosthetic devices, implants and grafts, initial encounter: Secondary | ICD-10-CM | POA: Insufficient documentation

## 2014-12-31 DIAGNOSIS — Z992 Dependence on renal dialysis: Secondary | ICD-10-CM | POA: Diagnosis not present

## 2014-12-31 DIAGNOSIS — I120 Hypertensive chronic kidney disease with stage 5 chronic kidney disease or end stage renal disease: Secondary | ICD-10-CM | POA: Diagnosis not present

## 2014-12-31 DIAGNOSIS — E663 Overweight: Secondary | ICD-10-CM | POA: Insufficient documentation

## 2014-12-31 DIAGNOSIS — T82858A Stenosis of vascular prosthetic devices, implants and grafts, initial encounter: Secondary | ICD-10-CM | POA: Diagnosis not present

## 2014-12-31 DIAGNOSIS — Y832 Surgical operation with anastomosis, bypass or graft as the cause of abnormal reaction of the patient, or of later complication, without mention of misadventure at the time of the procedure: Secondary | ICD-10-CM | POA: Insufficient documentation

## 2014-12-31 HISTORY — PX: PERIPHERAL VASCULAR CATHETERIZATION: SHX172C

## 2014-12-31 LAB — POTASSIUM (ARMC VASCULAR LAB ONLY): Potassium (ARMC vascular lab): 4.5

## 2014-12-31 SURGERY — THROMBECTOMY
Anesthesia: Moderate Sedation | Laterality: Right

## 2014-12-31 MED ORDER — CEFAZOLIN SODIUM 1-5 GM-% IV SOLN
INTRAVENOUS | Status: AC
  Filled 2014-12-31: qty 50

## 2014-12-31 MED ORDER — HEPARIN (PORCINE) IN NACL 2-0.9 UNIT/ML-% IJ SOLN
INTRAMUSCULAR | Status: AC
  Filled 2014-12-31: qty 1000

## 2014-12-31 MED ORDER — LIDOCAINE-EPINEPHRINE (PF) 1 %-1:200000 IJ SOLN
INTRAMUSCULAR | Status: AC
  Filled 2014-12-31: qty 30

## 2014-12-31 MED ORDER — MIDAZOLAM HCL 2 MG/2ML IJ SOLN
INTRAMUSCULAR | Status: DC | PRN
  Administered 2014-12-31: 2 mg via INTRAVENOUS

## 2014-12-31 MED ORDER — SODIUM CHLORIDE 0.9 % IV SOLN: INTRAVENOUS | Status: DC

## 2014-12-31 MED ORDER — IOHEXOL 300 MG/ML  SOLN
INTRAMUSCULAR | Status: DC | PRN
  Administered 2014-12-31: 35 mL via INTRAVENOUS

## 2014-12-31 MED ORDER — MIDAZOLAM HCL 5 MG/5ML IJ SOLN
INTRAMUSCULAR | Status: AC
  Filled 2014-12-31: qty 5

## 2014-12-31 MED ORDER — HEPARIN SODIUM (PORCINE) 1000 UNIT/ML IJ SOLN
INTRAMUSCULAR | Status: AC
  Filled 2014-12-31: qty 1

## 2014-12-31 MED ORDER — FENTANYL CITRATE (PF) 100 MCG/2ML IJ SOLN
INTRAMUSCULAR | Status: AC
  Filled 2014-12-31: qty 2

## 2014-12-31 MED ORDER — FENTANYL CITRATE (PF) 100 MCG/2ML IJ SOLN
INTRAMUSCULAR | Status: DC | PRN
  Administered 2014-12-31: 25 ug via INTRAVENOUS
  Administered 2014-12-31: 50 ug via INTRAVENOUS

## 2014-12-31 MED ORDER — DEXTROSE 5 % IV SOLN
1.5000 g | INTRAVENOUS | Status: AC
  Administered 2014-12-31: 1.5 g via INTRAVENOUS

## 2014-12-31 MED ORDER — HEPARIN SODIUM (PORCINE) 1000 UNIT/ML IJ SOLN
INTRAMUSCULAR | Status: DC | PRN
  Administered 2014-12-31: 4000 [IU] via INTRAVENOUS

## 2014-12-31 SURGICAL SUPPLY — 12 items
BALLN DORADO 7X80X80 (BALLOONS) ×3
BALLN DORADO 8X100X80 (BALLOONS) ×3
BALLOON DORADO 7X80X80 (BALLOONS) ×1 IMPLANT
BALLOON DORADO 8X100X80 (BALLOONS) ×1 IMPLANT
CANNULA 5F STIFF (CANNULA) ×3 IMPLANT
CATH EMBOLECTOMY 5FR (BALLOONS) ×3 IMPLANT
DEVICE PRESTO INFLATION (MISCELLANEOUS) ×3 IMPLANT
PACK ANGIOGRAPHY (CUSTOM PROCEDURE TRAY) ×3 IMPLANT
SET AVX THROMB ULT (MISCELLANEOUS) ×3 IMPLANT
SHEATH BRITE TIP 6FRX5.5 (SHEATH) ×6 IMPLANT
TOWEL OR 17X26 4PK STRL BLUE (TOWEL DISPOSABLE) ×3 IMPLANT
WIRE MAGIC TOR.035 180C (WIRE) ×6 IMPLANT

## 2014-12-31 NOTE — H&P (Signed)
[facility] Admission History & Physical  MRN : [id number]  [name] [name] is a 49 y.o. ([birth year]) male who presents with chief complaint of No chief complaint on file. 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  Has been treated many times and remains on anticoagulation.  No other complaints today.   Current Facility-Administered Medications  Medication Dose Route Frequency Provider Last Rate Last Dose  . 0.9 %  sodium chloride infusion   Intravenous Continuous Kimberly A Stegmayer, PA-C      . cefUROXime (ZINACEF) 1.5 g in dextrose 5 % 50 mL IVPB  1.5 g Intravenous 30 min Pre-Op Sela Hua, PA-C        Past Medical History  Diagnosis Date  . Hypertension   . Orthostatic hypotension      2008  . Chest discomfort      2008  . Sinus tachycardia      2008, TSH normal  . Ejection fraction      65%, echo, 2008   /   EF 55-60%, echo, October 28, 2010  . Overweight(278.02)   . Syncope      positional after dialysis... 2008  . Aneurysm      Right arm fistula 3 aneurysms 2011,   plans to have a new procedure  . CHF (congestive heart failure)   . Leg pain   . Angina     occasional, last 6 mo ago  . Dialysis patient     M-W-F @ [facility]  . ESRD (end stage renal disease)     on hemodialysis M-W-F [facility]    Past Surgical History  Procedure Laterality Date  . Right arm graft      for dyalisis  . Umbilical hernia repair    . Thrombectomy    . Arteriovenous graft placement    . Av fistula placement    . Thrombectomy w/ embolectomy  03/01/2011    Procedure: THROMBECTOMY ARTERIOVENOUS GORE-TEX GRAFT;  Surgeon: Elam Dutch, MD;  Location: [facility];  Service: Vascular;  Laterality: Right;  Attempted Thrombectomy of Old  Right Upper Arm Arteriovenous gortex Graft. Insertion of new Arteriovenous Graft using 5mm x 50cm Gortex Stretch graft.   . Insertion of dialysis  catheter  03/01/2011    Procedure: INSERTION OF DIALYSIS CATHETER;  Surgeon: Elam Dutch, MD;  Location: [facility];  Service: Vascular;  Laterality: Left;  Exchange of Dialysis Catheter to 27cm 15Fr. Arrow Catheter  . Thrombectomy w/ embolectomy  07/11/2011    Procedure: THROMBECTOMY ARTERIOVENOUS GORE-TEX GRAFT;  Surgeon: Rosetta Posner, MD;  Location: [facility];  Service: Vascular;  Laterality: Right;  [name] N/A 08/23/2011    Procedure: VENOGRAM;  Surgeon: [name], MD;  Location: [facility] CATH LAB;  Service: Cardiovascular;  Laterality: N/A;  . Peripheral vascular catheterization N/A 09/01/2014    Procedure: A/V Shuntogram/Fistulagram;  Surgeon: Algernon Huxley, MD;  Location: [place] CV LAB;  Service: Cardiovascular;  Laterality: N/A;  . Peripheral vascular catheterization Right 09/01/2014    Procedure: Thrombectomy;  Surgeon: Algernon Huxley, MD;  Location: [place] [facility];  Service: Cardiovascular;  Laterality: Right;  . Peripheral vascular catheterization Right 09/01/2014    Procedure: A/V Shunt Intervention;  Surgeon: Algernon Huxley, MD;  Location: [place] [facility];  Service: Cardiovascular;  Laterality: Right;  . Peripheral vascular catheterization N/A 09/17/2014  Procedure: A/V Shuntogram/Fistulagram;  Surgeon: Algernon Huxley, MD;  Location: [place];  Service: Cardiovascular;  Laterality: N/A;  . Peripheral vascular catheterization Right 10/17/2014    Procedure: Thrombectomy;  Surgeon: Katha Cabal, MD;  Location: [place] [facility];  Service: Cardiovascular;  Laterality: Right;  . Peripheral vascular catheterization N/A 10/17/2014    Procedure: A/V Shuntogram/Fistulagram;  Surgeon: Katha Cabal, MD;  Location: [place] [facility];  Service: Cardiovascular;  Laterality: N/A;  . Peripheral vascular catheterization N/A 10/17/2014    Procedure: A/V Shunt Intervention;  Surgeon: Katha Cabal, MD;  Location: [place] [facility];  Service: Cardiovascular;  Laterality:  N/A;  . Peripheral vascular catheterization Right 11/04/2014    Procedure: Thrombectomy;  Surgeon: Katha Cabal, MD;  Location: [place] [facility];  Service: Cardiovascular;  Laterality: Right;  . Peripheral vascular catheterization Left 11/04/2014    Procedure: Visceral Venography;  Surgeon: Katha Cabal, MD;  Location: [place] [facility];  Service: Cardiovascular;  Laterality: Left;  . Peripheral vascular catheterization Right 11/27/2014    Procedure: A/V Shuntogram/Fistulagram;  Surgeon: Algernon Huxley, MD;  Location: [place] [place] [facility];  Service: Cardiovascular;  Laterality: Right;  . Peripheral vascular catheterization Right 11/27/2014    Procedure: A/V Shunt Intervention;  Surgeon: Algernon Huxley, MD;  Location: [place] [facility];  Service: Cardiovascular;  Laterality: Right;    Social History Social History  Substance Use Topics  . Smoking status: Former Smoker    Types: Cigarettes    Quit date: 04/18/1994  . Smokeless tobacco: Current User    Types: Snuff     Comment: dips 1/2 snuff per day times 25 years  . Alcohol Use: No  No IVDU  Family History Family History  Problem Relation Age of Onset  . Hypertension Mother   . Heart disease Mother   . Hypertension Father   . Heart disease Father   No bleeding disorders or clotting disorders  No Known Allergies   REVIEW OF SYSTEMS (Negative unless checked)  Constitutional: [] Weight loss  [] Fever  [] Chills Cardiac: [] Chest pain   [] Chest pressure   [] Palpitations   [] Shortness of breath when laying flat   [] Shortness of breath at rest   [] Shortness of breath with exertion. Vascular:  [] Pain in legs with walking   [] Pain in legs at rest   [] Pain in legs when laying flat   [] Claudication   [] Pain in feet when walking  [] Pain in feet at rest  [] Pain in feet when laying flat   [] History of DVT   [] Phlebitis   [] Swelling in legs   [] Varicose veins   [] Non-healing ulcers Pulmonary:   [] Uses home oxygen   [] Productive cough    [] Hemoptysis   [] Wheeze  [] COPD   [] Asthma Neurologic:  [] Dizziness  [] Blackouts   [] Seizures   [] History of stroke   [] History of TIA  [] Aphasia   [] Temporary blindness   [] Dysphagia   [] Weakness or numbness in arms   [] Weakness or numbness in legs Musculoskeletal:  [] Arthritis   [] Joint swelling   [] Joint pain   [] Low back pain Hematologic:  [] Easy bruising  [] Easy bleeding   [] Hypercoagulable state   [] Anemic  [] Hepatitis Gastrointestinal:  [] Blood in stool   [] Vomiting blood  [] Gastroesophageal reflux/heartburn   [] Difficulty swallowing. Genitourinary:  [x] Chronic kidney disease   [] Difficult urination  [] Frequent urination  [] Burning with urination   [] Blood in urine Skin:  [] Rashes   [] Ulcers   [] Wounds Psychological:  []   History of anxiety   []  History of major depression.  Physical Examination  Filed Vitals:   12/31/14 0936  BP: 140/98  Pulse: 63  Resp: 20  SpO2: 98%   There is no weight on file to calculate BMI. Gen: WD/WN, NAD Head: [place]/AT, No temporalis wasting. Prominent temp pulse not noted. Ear/Nose/Throat: Hearing grossly intact, nares w/o erythema or drainage, oropharynx w/o Erythema/Exudate,  Eyes: PERRLA, EOMI.  Neck: Supple, no nuchal rigidity.  No bruit or JVD.  Pulmonary:  Good air movement, clear to auscultation bilaterally, no use of accessory muscles.  Cardiac: RRR, normal S1, S2, no Murmurs, rubs or gallops. Vascular: no thrill or bruit in right arm HeRO graft Vessel Right Left  Radial Palpable Palpable                                   Gastrointestinal: soft, non-tender/non-distended. No guarding/reflex.  Musculoskeletal: M/S 5/5 throughout.  Extremities without ischemic changes.  No deformity or atrophy.  Neurologic: CN 2-12 intact. Pain and light touch intact in extremities.  Symmetrical.  Speech is fluent. Motor exam as listed above. Psychiatric: Judgment intact, Mood & affect appropriate for pt's clinical situation. Dermatologic: No rashes or  ulcers noted.  No cellulitis or open wounds. Lymph : No Cervical, Axillary, or Inguinal lymphadenopathy.     CBC Lab Results  Component Value Date   WBC 7.5 11/13/2013   HGB 11.3* 11/13/2013   HCT 36.2* 11/13/2013   MCV 77* 11/13/2013   PLT 211 11/13/2013    BMET    Component Value Date/Time   NA 137 11/13/2013 0408   NA 136 04/06/2012 1426   K 4.1 11/13/2013 0408   K 5.0 08/08/2012 1430   CL 98 11/13/2013 0408   CL 92* 04/06/2012 1426   CO2 25 11/13/2013 0408   CO2 22 04/06/2012 1426   GLUCOSE 64* 11/13/2013 0408   GLUCOSE 87 04/06/2012 1426   BUN 68* 11/13/2013 0408   BUN 78* 04/06/2012 1426   CREATININE 16.45* 11/13/2013 0408   CREATININE 17.82* 04/06/2012 1426   CALCIUM 6.8* 11/13/2013 0408   CALCIUM 8.6 04/06/2012 1426   GFRNONAA 3* 11/13/2013 0408   GFRNONAA 3* 04/06/2012 1426   GFRAA 3* 11/13/2013 0408   GFRAA 3* 04/06/2012 1426   CrCl cannot be calculated (Unknown ideal weight.).  COAG Lab Results  Component Value Date   INR 1.05 08/08/2012   INR 0.98 06/01/2011   INR 1.04 03/01/2011    Radiology No results found.    Assessment/Plan 1. ESRD: Needs functional access for dialysis 2. Dysfunction of dialysis access.  HeRO graft clotted again even on anticoagulation.  Will attempt declot to salvage 3. HTN: stable.  Continue outpatient meds   Rhodie Cienfuegos, MD  12/31/2014 2:40 PM

## 2014-12-31 NOTE — Op Note (Signed)
VEIN AND VASCULAR SURGERY    OPERATIVE NOTE   PROCEDURE: 1.  right arm HeRO graft cannulation under ultrasound guidance in both a retrograde and then antegrade fashion crossing 2.  right arm shuntogram and central venogram 3.  Catheter directed thrombolysis with 4 mg of TPA delivered with the AngioJet AVX catheter 4.  Mechanical rheolytic thrombectomy to the HeRO graft with the AngioJet AVX catheter 5.  Fogarty embolectomy for residual arterial plug 6.  Percutaneous transluminal angioplasty of arterial anastomosis with 7 mm diameter high pressure balloon 7.  Percutaneous transluminal angioplasty of the mid and distal graft with 7 and 8 mm diameter high pressure balloons  PRE-OPERATIVE DIAGNOSIS: 1. ESRD 2.  Thrombosed right arm HeRO graft  POST-OPERATIVE DIAGNOSIS: same as above   SURGEON: Leotis Pain, MD  ANESTHESIA: local with MCS  ESTIMATED BLOOD LOSS: 25 cc  FINDING(S): 1. Thrombosed HeRO graft, opened with intervention  SPECIMEN(S):  None  CONTRAST: 35 cc  INDICATIONS: Patient is a 49 yo male who presents with a thrombosed right arm HeRO graft.  The patient is scheduled for an attempted declot and shuntogram.  The patient is aware the risks include but are not limited to: bleeding, infection, thrombosis of the cannulated access, and possible anaphylactic reaction to the contrast.  The patient is aware of the risks of the procedure and elects to proceed forward.  DESCRIPTION: After full informed written consent was obtained, the patient was brought back to the angiography suite and placed supine upon the angiography table.  The patient was connected to monitoring equipment.  The right arm was prepped and draped in the standard fashion for a percutaneous access intervention.  Under ultrasound guidance, the right arm HeRO graft was cannulated with a micropuncture needle under direct ultrasound guidance due to the pulseless nature of the graft in both an antegrade and a  retrograde fashion crossing, and permanent images were performed.  The microwire was advanced and the needle was exchanged for the a microsheath.  I then upsized to a 6 Fr Sheath and imaging was performed.  Hand injections were completed to image the access including the central venous system. This demonstrated no flow within the HeRO graft.  Based on the images, this patient will need extensive treatment to salvage the graft. I then gave the patient 4000 units of intravenous heparin.  I then placed a Magic torque wire into the brachial artery from the retrograde sheath and into the right atrium from the antegrade sheath. 4 mg of TPA were deployed throughout the entirety of the PTFE portion of the graft and into the brachial artery at the anastomosis. This was allowed to dwell for 10-15 minutes. Mechanical rheolytic thrombectomy was then performed throughout the PTFE portion of the HeRO graft and into the brachial artery at the anastomosis. This uncovered thrombus and stenosis within the graft and a residual arterial plug was also seen at the arterial anastomosis. An attempt to clear the arterial plug was done with four passes of the Fogarty embolectomy balloon. Flow-limiting arterial plug remained, and I elected to treat this lesion with a 7 mm diameter by 10 cm length high pressure angioplasty balloon. This resulted in resolution of the arterial plug, and clearance of the arterial side of the graft. The arterial outflow was seen to be intact distal to the graft on these images. The retrograde sheath was removed as a 4-0 Monocryl pursestring suture was placed. I then turned my attention to the thrombus in the distal graft. Mechanical  rheolytic thrombectomy was performed again. This resulted in residual thrombus and stenosis in the mid and distal PTFE portions of the HeRO graft.  I then elected to treat this with a 7 then an 8 mm diameter x 10 cm length high pressure angioplasty balloon.  At this point, only a  small amount of residual thrombus that was not flow limiting remained, and the graft had excellent flow clinically.    Based on the completion imaging, no further intervention is necessary.  The wire and balloon were removed from the sheath.  A 4-0 Monocryl purse-string suture was sewn around the sheath.  The sheath was removed while tying down the suture.  A sterile bandage was applied to the puncture site.  COMPLICATIONS: None  CONDITION: Stable   Wally Shevchenko 12/31/2014 4:56 PM

## 2014-12-31 NOTE — Discharge Instructions (Signed)
The drugs you were given will stay in your system until tomorrow, so for the next 24 hours you should not.  Drive an automobile. Make any legal decisions.  Drink any alcoholic beverages.  Today you should start with liquids and gradually work up to solid foods as your are able to tolerate them  Resume your regular medications as prescribed by your doctor.  Avoid aspirin for 24 hours.    Change the Band-Aid or dressing as needed.  After a 2 days no dressing as needed. CALL OFFICE FOR AN APPOINTMENT WITH DR. Lucky Cowboy IN 4 WEEKS WITH AN HDA DUPLEX PLEASE Avoid strenuous activity for the remainder of the day.  Please notify your primary physician immediately if you have any unusual bleeding, trouble breathing, fever >100 degrees or pain not relieved by the medication your doctor prescribed for your doctor prescribed for you physician  Return to diaslysis  tommorow.

## 2015-01-01 ENCOUNTER — Encounter: Payer: Self-pay | Admitting: Vascular Surgery

## 2015-01-01 DIAGNOSIS — Z992 Dependence on renal dialysis: Secondary | ICD-10-CM | POA: Diagnosis not present

## 2015-01-01 DIAGNOSIS — N2581 Secondary hyperparathyroidism of renal origin: Secondary | ICD-10-CM | POA: Diagnosis not present

## 2015-01-01 DIAGNOSIS — Z23 Encounter for immunization: Secondary | ICD-10-CM | POA: Diagnosis not present

## 2015-01-01 DIAGNOSIS — N186 End stage renal disease: Secondary | ICD-10-CM | POA: Diagnosis not present

## 2015-01-03 DIAGNOSIS — Z23 Encounter for immunization: Secondary | ICD-10-CM | POA: Diagnosis not present

## 2015-01-03 DIAGNOSIS — N186 End stage renal disease: Secondary | ICD-10-CM | POA: Diagnosis not present

## 2015-01-03 DIAGNOSIS — Z992 Dependence on renal dialysis: Secondary | ICD-10-CM | POA: Diagnosis not present

## 2015-01-03 DIAGNOSIS — N2581 Secondary hyperparathyroidism of renal origin: Secondary | ICD-10-CM | POA: Diagnosis not present

## 2015-01-06 DIAGNOSIS — N186 End stage renal disease: Secondary | ICD-10-CM | POA: Diagnosis not present

## 2015-01-06 DIAGNOSIS — Z992 Dependence on renal dialysis: Secondary | ICD-10-CM | POA: Diagnosis not present

## 2015-01-06 DIAGNOSIS — N2581 Secondary hyperparathyroidism of renal origin: Secondary | ICD-10-CM | POA: Diagnosis not present

## 2015-01-06 DIAGNOSIS — Z23 Encounter for immunization: Secondary | ICD-10-CM | POA: Diagnosis not present

## 2015-01-08 DIAGNOSIS — Z992 Dependence on renal dialysis: Secondary | ICD-10-CM | POA: Diagnosis not present

## 2015-01-08 DIAGNOSIS — Z23 Encounter for immunization: Secondary | ICD-10-CM | POA: Diagnosis not present

## 2015-01-08 DIAGNOSIS — N186 End stage renal disease: Secondary | ICD-10-CM | POA: Diagnosis not present

## 2015-01-08 DIAGNOSIS — N2581 Secondary hyperparathyroidism of renal origin: Secondary | ICD-10-CM | POA: Diagnosis not present

## 2015-01-12 ENCOUNTER — Ambulatory Visit
Admission: AD | Admit: 2015-01-12 | Discharge: 2015-01-12 | Disposition: A | Discharge status: Home / Self Care | Payer: Medicare Other | Source: Ambulatory Visit | Attending: Vascular Surgery | Admitting: Vascular Surgery

## 2015-01-12 ENCOUNTER — Encounter: Payer: Self-pay | Admitting: *Deleted

## 2015-01-12 ENCOUNTER — Other Ambulatory Visit: Payer: Self-pay | Admitting: Vascular Surgery

## 2015-01-12 ENCOUNTER — Encounter
Admission: AD | Disposition: A | Discharge status: Home / Self Care | Payer: Self-pay | Source: Ambulatory Visit | Attending: Vascular Surgery

## 2015-01-12 DIAGNOSIS — N186 End stage renal disease: Secondary | ICD-10-CM | POA: Diagnosis not present

## 2015-01-12 DIAGNOSIS — E663 Overweight: Secondary | ICD-10-CM | POA: Diagnosis not present

## 2015-01-12 DIAGNOSIS — I120 Hypertensive chronic kidney disease with stage 5 chronic kidney disease or end stage renal disease: Secondary | ICD-10-CM | POA: Diagnosis not present

## 2015-01-12 DIAGNOSIS — I209 Angina pectoris, unspecified: Secondary | ICD-10-CM | POA: Insufficient documentation

## 2015-01-12 DIAGNOSIS — T82858A Stenosis of vascular prosthetic devices, implants and grafts, initial encounter: Secondary | ICD-10-CM | POA: Diagnosis not present

## 2015-01-12 DIAGNOSIS — T82868A Thrombosis of vascular prosthetic devices, implants and grafts, initial encounter: Secondary | ICD-10-CM | POA: Insufficient documentation

## 2015-01-12 DIAGNOSIS — Z87891 Personal history of nicotine dependence: Secondary | ICD-10-CM | POA: Diagnosis not present

## 2015-01-12 DIAGNOSIS — Z992 Dependence on renal dialysis: Secondary | ICD-10-CM | POA: Diagnosis not present

## 2015-01-12 DIAGNOSIS — I509 Heart failure, unspecified: Secondary | ICD-10-CM | POA: Insufficient documentation

## 2015-01-12 DIAGNOSIS — Y832 Surgical operation with anastomosis, bypass or graft as the cause of abnormal reaction of the patient, or of later complication, without mention of misadventure at the time of the procedure: Secondary | ICD-10-CM | POA: Diagnosis not present

## 2015-01-12 HISTORY — PX: PERIPHERAL VASCULAR CATHETERIZATION: SHX172C

## 2015-01-12 LAB — POTASSIUM (ARMC VASCULAR LAB ONLY): Potassium (ARMC vascular lab): 4.5

## 2015-01-12 SURGERY — A/V SHUNTOGRAM/FISTULAGRAM
Anesthesia: Moderate Sedation | Laterality: Right

## 2015-01-12 MED ORDER — METOPROLOL TARTRATE 1 MG/ML IV SOLN: 2.0000 mg | INTRAVENOUS | Status: DC | PRN

## 2015-01-12 MED ORDER — ACETAMINOPHEN 325 MG PO TABS: 325.0000 mg | ORAL_TABLET | ORAL | Status: DC | PRN

## 2015-01-12 MED ORDER — ALTEPLASE 2 MG IJ SOLR
INTRAMUSCULAR | Status: DC | PRN
  Administered 2015-01-12: 4 mg

## 2015-01-12 MED ORDER — MORPHINE SULFATE (PF) 4 MG/ML IV SOLN: 2.0000 mg | INTRAVENOUS | Status: DC | PRN

## 2015-01-12 MED ORDER — HEPARIN SODIUM (PORCINE) 1000 UNIT/ML IJ SOLN
INTRAMUSCULAR | Status: AC
  Filled 2015-01-12: qty 1

## 2015-01-12 MED ORDER — CEFUROXIME SODIUM 1.5 G IJ SOLR
INTRAMUSCULAR | Status: AC
  Filled 2015-01-12: qty 1.5

## 2015-01-12 MED ORDER — FENTANYL CITRATE (PF) 100 MCG/2ML IJ SOLN
INTRAMUSCULAR | Status: DC | PRN
  Administered 2015-01-12 (×2): 50 ug via INTRAVENOUS

## 2015-01-12 MED ORDER — MIDAZOLAM HCL 2 MG/2ML IJ SOLN
INTRAMUSCULAR | Status: AC
  Filled 2015-01-12: qty 4

## 2015-01-12 MED ORDER — OXYCODONE-ACETAMINOPHEN 5-325 MG PO TABS: 1.0000 | ORAL_TABLET | ORAL | Status: DC | PRN

## 2015-01-12 MED ORDER — MIDAZOLAM HCL 2 MG/2ML IJ SOLN
INTRAMUSCULAR | Status: DC | PRN
  Administered 2015-01-12 (×3): 2 mg via INTRAVENOUS

## 2015-01-12 MED ORDER — SODIUM CHLORIDE 0.9 % IV SOLN
INTRAVENOUS | Status: DC
  Administered 2015-01-12: 15:00:00 via INTRAVENOUS

## 2015-01-12 MED ORDER — ALUM & MAG HYDROXIDE-SIMETH 200-200-20 MG/5ML PO SUSP: 15.0000 mL | ORAL | Status: DC | PRN

## 2015-01-12 MED ORDER — LIDOCAINE-EPINEPHRINE (PF) 1 %-1:200000 IJ SOLN
INTRAMUSCULAR | Status: AC
  Filled 2015-01-12: qty 30

## 2015-01-12 MED ORDER — LIDOCAINE HCL (PF) 1 % IJ SOLN
INTRAMUSCULAR | Status: DC | PRN
  Administered 2015-01-12: 5 mL

## 2015-01-12 MED ORDER — ONDANSETRON HCL 4 MG/2ML IJ SOLN: 4.0000 mg | Freq: Four times a day (QID) | INTRAMUSCULAR | Status: DC | PRN

## 2015-01-12 MED ORDER — FENTANYL CITRATE (PF) 100 MCG/2ML IJ SOLN
INTRAMUSCULAR | Status: AC
  Filled 2015-01-12: qty 2

## 2015-01-12 MED ORDER — HEPARIN SODIUM (PORCINE) 1000 UNIT/ML IJ SOLN
INTRAMUSCULAR | Status: DC | PRN
  Administered 2015-01-12: 4000 [IU] via INTRAVENOUS

## 2015-01-12 MED ORDER — GUAIFENESIN-DM 100-10 MG/5ML PO SYRP: 15.0000 mL | ORAL_SOLUTION | ORAL | Status: DC | PRN

## 2015-01-12 MED ORDER — MIDAZOLAM HCL 2 MG/2ML IJ SOLN
INTRAMUSCULAR | Status: AC
  Filled 2015-01-12: qty 2

## 2015-01-12 MED ORDER — HEPARIN (PORCINE) IN NACL 2-0.9 UNIT/ML-% IJ SOLN
INTRAMUSCULAR | Status: AC
  Filled 2015-01-12: qty 1000

## 2015-01-12 MED ORDER — LABETALOL HCL 5 MG/ML IV SOLN: 10.0000 mg | INTRAVENOUS | Status: DC | PRN

## 2015-01-12 MED ORDER — PHENOL 1.4 % MT LIQD: 1.0000 | OROMUCOSAL | Status: DC | PRN

## 2015-01-12 MED ORDER — IOHEXOL 300 MG/ML  SOLN
INTRAMUSCULAR | Status: DC | PRN
  Administered 2015-01-12: 25 mL via INTRAVENOUS

## 2015-01-12 MED ORDER — ACETAMINOPHEN 325 MG RE SUPP: 325.0000 mg | RECTAL | Status: DC | PRN

## 2015-01-12 MED ORDER — HYDRALAZINE HCL 20 MG/ML IJ SOLN: 5.0000 mg | INTRAMUSCULAR | Status: DC | PRN

## 2015-01-12 MED ORDER — DEXTROSE 5 % IV SOLN
1.5000 g | INTRAVENOUS | Status: AC
  Administered 2015-01-12: 1.5 g via INTRAVENOUS

## 2015-01-12 MED ORDER — CHLORHEXIDINE GLUCONATE CLOTH 2 % EX PADS: 6.0000 | MEDICATED_PAD | Freq: Once | CUTANEOUS | Status: DC

## 2015-01-12 SURGICAL SUPPLY — 11 items
BALLN DORADO 8X100X80 (BALLOONS) ×4
BALLOON DORADO 8X100X80 (BALLOONS) ×2 IMPLANT
CANNULA 5F STIFF (CANNULA) ×4 IMPLANT
CATH EMBOLECTOMY 5FR (BALLOONS) ×4 IMPLANT
DEVICE PRESTO INFLATION (MISCELLANEOUS) ×4 IMPLANT
DRAPE BRACHIAL (DRAPES) ×4 IMPLANT
PACK ANGIOGRAPHY (CUSTOM PROCEDURE TRAY) ×4 IMPLANT
SET AVX THROMB ULT (MISCELLANEOUS) ×4 IMPLANT
SHEATH BRITE TIP 6FRX5.5 (SHEATH) ×8 IMPLANT
TOWEL OR 17X26 4PK STRL BLUE (TOWEL DISPOSABLE) ×4 IMPLANT
WIRE MAGIC TOR.035 180C (WIRE) ×8 IMPLANT

## 2015-01-12 NOTE — Op Note (Signed)
[facility]    OPERATIVE NOTE   PROCEDURE: 1.  right arm HeRO graft cannulation under ultrasound guidance in both a retrograde and then antegrade fashion crossing 2.  right arm shuntogram and central venogram 3.  Catheter directed thrombolysis with 4 mg of TPA delivered with the AngioJet AVX catheter 4.  Mechanical rheolytic thrombectomy to the HeRO graft with the AngioJet AVX catheter 5.  Fogarty embolectomy for residual arterial plug 6.  Percutaneous transluminal angioplasty of mid and distal graft with 8 mm diameter x 10 cm length balloon inflated twice  PRE-OPERATIVE DIAGNOSIS: 1. ESRD 2.  Thrombosed right arm HeRO graft  POST-OPERATIVE DIAGNOSIS: same as above   SURGEON: [name] Pain, MD  ANESTHESIA: local with MCS  ESTIMATED BLOOD LOSS: 25 cc  CONTRAST: 25 cc  FLUORO TIME:  2.6 minutes  FINDING(S): 1. Thrombosed HeRO graft, opened with intervention  SPECIMEN(S):  None  INDICATIONS: Patient is a 49 yo [name] who presents with a thrombosed right arm HeRO graft.  The patient is scheduled for an attempted declot and shuntogram.  The patient is aware the risks include but are not limited to: bleeding, infection, thrombosis of the cannulated access, and possible anaphylactic reaction to the contrast.  The patient is aware of the risks of the procedure and elects to proceed forward.  DESCRIPTION: After full informed written consent was obtained, the patient was brought back to the angiography suite and placed supine upon the angiography table.  The patient was connected to monitoring equipment.  The right arm was prepped and draped in the standard fashion for a percutaneous access intervention.  Under ultrasound guidance, the right arm HeRO graft was cannulated with a micropuncture needle under direct ultrasound guidance due to the pulseless nature of the graft in both an antegrade and a retrograde fashion crossing, and permanent images were performed.  The  microwire was advanced and the needle was exchanged for the a microsheath.  I then upsized to a 6 Fr Sheath and imaging was performed.  Hand injections were completed to image the access including the central venous system. This demonstrated no flow within the HeRO graft.  Based on the images, this patient will need extensive treatment to salvage the graft. I then gave the patient 4000 units of intravenous heparin.  I then placed a Magic torque wire into the brachial artery from the retrograde sheath and into the right atrium from the antegrade sheath. 4 mg of TPA were deployed throughout the entirety of the PTFE portion of the graft and into the brachial artery at the anastomosis. This was allowed to dwell for 10-15 minutes. Mechanical rheolytic thrombectomy was then performed throughout the PTFE portion of the HeRO graft and into the brachial artery at the anastomosis. This uncovered narrowing and thrombus within the stents in the graft and a residual arterial plug was also seen at the arterial anastomosis. An attempt to clear the arterial plug was done with two passes of the Fogarty embolectomy balloon. This resulted in resolution of the arterial plug, and clearance of the arterial side of the graft. The arterial outflow was seen to be intact distal to the graft on these images. The retrograde sheath was removed as a 4-0 Monocryl pursestring suture was placed. I then turned my attention to the thrombus in the distal graft. Mechanical rheolytic thrombectomy was performed again. This resulted in improvement in the mid to distal graft thrombus and narrowing, but not resolution.  I then elected to treat this with  an 8 mm diameter x 10 cm length high pressure angioplasty balloon inflated twice to about 12 atm for one minute first in the mid graft and then in the distal portion of the PTFE portion of the graft.  This resulted in marked improvement and no residual thrombus and <10% residual stenosis in the graft.     Based on the completion imaging, no further intervention is necessary.  The wire and balloon were removed from the sheath.  A 4-0 Monocryl purse-string suture was sewn around the sheath.  The antegrade sheath was removed while tying down the suture.  A sterile bandage was applied to the puncture site.  COMPLICATIONS: None  CONDITION: Stable   DEW,[name] [name] 01/12/2015 3:56 PM

## 2015-01-12 NOTE — H&P (Signed)
[facility] Admission History & Physical  MRN : [id number]  [name] [name] is a 49 y.o. (Jul 12, 1965) male who presents with chief complaint of No chief complaint on file. 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 We've treated this many times and he has come back in for recurrent thrombosis today. He desires an attempted salvage.  Current Facility-Administered Medications  Medication Dose Route Frequency Provider Last Rate Last Dose  . 0.9 %  sodium chloride infusion   Intravenous Continuous Kimberly A Stegmayer, PA-C      . cefUROXime (ZINACEF) 1.5 g in dextrose 5 % 50 mL IVPB  1.5 g Intravenous 30 min Pre-Op Sela Hua, PA-C        Past Medical History  Diagnosis Date  . Hypertension   . Orthostatic hypotension      2008  . Chest discomfort      2008  . Sinus tachycardia      2008, TSH normal  . Ejection fraction      65%, echo, 2008   /   EF 55-60%, echo, October 28, 2010  . Overweight(278.02)   . Syncope      positional after dialysis... 2008  . Aneurysm      Right arm fistula 3 aneurysms 2011,   plans to have a new procedure  . CHF (congestive heart failure)   . Leg pain   . Angina     occasional, last 6 mo ago  . Dialysis patient     M-W-F @ [facility]  . ESRD (end stage renal disease)     on hemodialysis M-W-F [facility]    Past Surgical History  Procedure Laterality Date  . Right arm graft      for dyalisis  . Umbilical hernia repair    . Thrombectomy    . Arteriovenous graft placement    . Av fistula placement    . Thrombectomy w/ embolectomy  03/01/2011    Procedure: THROMBECTOMY ARTERIOVENOUS GORE-TEX GRAFT;  Surgeon: Elam Dutch, MD;  Location: [facility];  Service: Vascular;  Laterality: Right;  Attempted Thrombectomy of Old  Right Upper Arm Arteriovenous gortex Graft. Insertion of new Arteriovenous Graft using 88mm x 50cm Gortex  Stretch graft.   . Insertion of dialysis catheter  03/01/2011    Procedure: INSERTION OF DIALYSIS CATHETER;  Surgeon: Elam Dutch, MD;  Location: [facility];  Service: Vascular;  Laterality: Left;  Exchange of Dialysis Catheter to 27cm 15Fr. Arrow Catheter  . Thrombectomy w/ embolectomy  07/11/2011    Procedure: THROMBECTOMY ARTERIOVENOUS GORE-TEX GRAFT;  Surgeon: Rosetta Posner, MD;  Location: [facility];  Service: Vascular;  Laterality: Right;  [name] N/A 08/23/2011    Procedure: VENOGRAM;  Surgeon: [name], MD;  Location: [facility] CATH LAB;  Service: Cardiovascular;  Laterality: N/A;  . Peripheral vascular catheterization N/A 09/01/2014    Procedure: A/V Shuntogram/Fistulagram;  Surgeon: Algernon Huxley, MD;  Location: [place] [facility];  Service: Cardiovascular;  Laterality: N/A;  . Peripheral vascular catheterization Right 09/01/2014    Procedure: Thrombectomy;  Surgeon: Algernon Huxley, MD;  Location: [place] [facility];  Service: Cardiovascular;  Laterality: Right;  . Peripheral vascular catheterization Right 09/01/2014    Procedure: A/V Shunt Intervention;  Surgeon: Algernon Huxley, MD;  Location: [place] [facility];  Service: Cardiovascular;  Laterality: Right;  . Peripheral vascular catheterization N/A  09/17/2014    Procedure: A/V Shuntogram/Fistulagram;  Surgeon: Algernon Huxley, MD;  Location: [place] [facility];  Service: Cardiovascular;  Laterality: N/A;  . Peripheral vascular catheterization Right 10/17/2014    Procedure: Thrombectomy;  Surgeon: Katha Cabal, MD;  Location: [place] [facility];  Service: Cardiovascular;  Laterality: Right;  . Peripheral vascular catheterization N/A 10/17/2014    Procedure: A/V Shuntogram/Fistulagram;  Surgeon: Katha Cabal, MD;  Location: [place] [facility];  Service: Cardiovascular;  Laterality: N/A;  . Peripheral vascular catheterization N/A 10/17/2014    Procedure: A/V Shunt Intervention;  Surgeon: Katha Cabal, MD;  Location: [place] [facility];   Service: Cardiovascular;  Laterality: N/A;  . Peripheral vascular catheterization Right 11/04/2014    Procedure: Thrombectomy;  Surgeon: Katha Cabal, MD;  Location: [place] [facility];  Service: Cardiovascular;  Laterality: Right;  . Peripheral vascular catheterization Left 11/04/2014    Procedure: Visceral Venography;  Surgeon: Katha Cabal, MD;  Location: [place] [facility];  Service: Cardiovascular;  Laterality: Left;  . Peripheral vascular catheterization Right 11/27/2014    Procedure: A/V Shuntogram/Fistulagram;  Surgeon: Algernon Huxley, MD;  Location: [place] [facility];  Service: Cardiovascular;  Laterality: Right;  . Peripheral vascular catheterization Right 11/27/2014    Procedure: A/V Shunt Intervention;  Surgeon: Algernon Huxley, MD;  Location: [place] [facility];  Service: Cardiovascular;  Laterality: Right;  . Peripheral vascular catheterization Right 12/31/2014    Procedure: Thrombectomy;  Surgeon: Algernon Huxley, MD;  Location: [place] [facility];  Service: Cardiovascular;  Laterality: Right;    Social History Social History  Substance Use Topics  . Smoking status: Former Smoker    Types: Cigarettes    Quit date: 04/18/1994  . Smokeless tobacco: Current User    Types: Snuff     Comment: dips 1/2 snuff per day times 25 years  . Alcohol Use: No   no IV drug use  Family History Family History  Problem Relation Age of Onset  . Hypertension Mother   . Heart disease Mother   . Hypertension Father   . Heart disease Father    no known family history of clotting disorders or bleeding disorders  No Known Allergies   REVIEW OF SYSTEMS (Negative unless checked)  Constitutional: [] Weight loss  [] Fever  [] Chills Cardiac: [] Chest pain   [] Chest pressure   [] Palpitations   [] Shortness of breath when laying flat   [] Shortness of breath at rest   [] Shortness of breath with exertion. Vascular:  [] Pain in legs with walking   [] Pain in legs at rest   [] Pain in legs when  laying flat   [] Claudication   [] Pain in feet when walking  [] Pain in feet at rest  [] Pain in feet when laying flat   [] History of DVT   [] Phlebitis   [] Swelling in legs   [] Varicose veins   [] Non-healing ulcers Pulmonary:   [] Uses home oxygen   [] Productive cough   [] Hemoptysis   [] Wheeze  [] COPD   [] Asthma Neurologic:  [] Dizziness  [] Blackouts   [] Seizures   [] History of stroke   [] History of TIA  [] Aphasia   [] Temporary blindness   [] Dysphagia   [] Weakness or numbness in arms   [] Weakness or numbness in legs Musculoskeletal:  [] Arthritis   [] Joint swelling   [] Joint pain   [] Low back pain Hematologic:  [] Easy bruising  [] Easy bleeding   [x] Hypercoagulable state   [] Anemic  [] Hepatitis Gastrointestinal:  [] Blood in stool   []   Vomiting blood  [] Gastroesophageal reflux/heartburn   [] Difficulty swallowing. Genitourinary:  [x] Chronic kidney disease   [] Difficult urination  [] Frequent urination  [] Burning with urination   [] Blood in urine Skin:  [] Rashes   [] Ulcers   [] Wounds Psychological:  [] History of anxiety   []  History of major depression.  Physical Examination  Filed Vitals:   01/12/15 1315  BP: 127/95  Pulse: 78  Temp: 97.9 F (36.6 C)  TempSrc: Oral  Resp: 16  Height: 6' (1.829 m)  Weight: 157.398 kg (347 lb)  SpO2: 96%   Body mass index is 47.05 kg/(m^2). Gen: WD/WN, NAD Head: [place]/AT, No temporalis wasting. Prominent temp pulse not noted. Ear/Nose/Throat: Hearing grossly intact, nares w/o erythema or drainage, oropharynx w/o Erythema/Exudate,  Eyes: PERRLA, EOMI.  Neck: Supple, no nuchal rigidity.  No bruit or JVD.  Pulmonary:  Good air movement, clear to auscultation bilaterally, no use of accessory muscles.  Cardiac: RRR, normal S1, S2, no Murmurs, rubs or gallops. Vascular: No thrill or bruit in right arm hero graft Vessel Right Left  Radial Palpable Palpable                                   Gastrointestinal: soft, non-tender/non-distended. No guarding/reflex.   Musculoskeletal: M/S 5/5 throughout.  Extremities without ischemic changes.  No deformity or atrophy.  Neurologic: CN 2-12 intact. Pain and light touch intact in extremities.  Symmetrical.  Speech is fluent. Motor exam as listed above. Psychiatric: Judgment intact, Mood & affect appropriate for pt's clinical situation. Dermatologic: No rashes or ulcers noted.  No cellulitis or open wounds. Lymph : No Cervical, Axillary, or Inguinal lymphadenopathy.     CBC Lab Results  Component Value Date   WBC 7.5 11/13/2013   HGB 11.3* 11/13/2013   HCT 36.2* 11/13/2013   MCV 77* 11/13/2013   PLT 211 11/13/2013    BMET    Component Value Date/Time   NA 137 11/13/2013 0408   NA 136 04/06/2012 1426   K 4.1 11/13/2013 0408   K 5.0 08/08/2012 1430   CL 98 11/13/2013 0408   CL 92* 04/06/2012 1426   CO2 25 11/13/2013 0408   CO2 22 04/06/2012 1426   GLUCOSE 64* 11/13/2013 0408   GLUCOSE 87 04/06/2012 1426   BUN 68* 11/13/2013 0408   BUN 78* 04/06/2012 1426   CREATININE 16.45* 11/13/2013 0408   CREATININE 17.82* 04/06/2012 1426   CALCIUM 6.8* 11/13/2013 0408   CALCIUM 8.6 04/06/2012 1426   GFRNONAA 3* 11/13/2013 0408   GFRNONAA 3* 04/06/2012 1426   GFRAA 3* 11/13/2013 0408   GFRAA 3* 04/06/2012 1426   CrCl cannot be calculated (Patient has no serum creatinine result on file.).  COAG Lab Results  Component Value Date   INR 1.05 08/08/2012   INR 0.98 06/01/2011   INR 1.04 03/01/2011    Radiology No results found.   Assessment/Plan 1. End-stage renal disease. Needs functional access. 2. Dysfunction of dialysis access. Recurrent thrombosis of right arm hero graft. Will attempt salvage today. Already on anticoagulation.   DEW,JASON, MD  01/12/2015 2:13 PM

## 2015-01-12 NOTE — Discharge Instructions (Signed)

## 2015-01-13 ENCOUNTER — Encounter: Payer: Self-pay | Admitting: Vascular Surgery

## 2015-01-13 DIAGNOSIS — Z992 Dependence on renal dialysis: Secondary | ICD-10-CM | POA: Diagnosis not present

## 2015-01-13 DIAGNOSIS — N186 End stage renal disease: Secondary | ICD-10-CM | POA: Diagnosis not present

## 2015-01-13 DIAGNOSIS — Z23 Encounter for immunization: Secondary | ICD-10-CM | POA: Diagnosis not present

## 2015-01-13 DIAGNOSIS — N2581 Secondary hyperparathyroidism of renal origin: Secondary | ICD-10-CM | POA: Diagnosis not present

## 2015-01-15 DIAGNOSIS — Z992 Dependence on renal dialysis: Secondary | ICD-10-CM | POA: Diagnosis not present

## 2015-01-15 DIAGNOSIS — Z23 Encounter for immunization: Secondary | ICD-10-CM | POA: Diagnosis not present

## 2015-01-15 DIAGNOSIS — N186 End stage renal disease: Secondary | ICD-10-CM | POA: Diagnosis not present

## 2015-01-15 DIAGNOSIS — N2581 Secondary hyperparathyroidism of renal origin: Secondary | ICD-10-CM | POA: Diagnosis not present

## 2015-01-16 DIAGNOSIS — N186 End stage renal disease: Secondary | ICD-10-CM | POA: Diagnosis not present

## 2015-01-16 DIAGNOSIS — Z992 Dependence on renal dialysis: Secondary | ICD-10-CM | POA: Diagnosis not present

## 2015-01-17 DIAGNOSIS — N186 End stage renal disease: Secondary | ICD-10-CM | POA: Diagnosis not present

## 2015-01-17 DIAGNOSIS — D509 Iron deficiency anemia, unspecified: Secondary | ICD-10-CM | POA: Diagnosis not present

## 2015-01-17 DIAGNOSIS — Z992 Dependence on renal dialysis: Secondary | ICD-10-CM | POA: Diagnosis not present

## 2015-01-17 DIAGNOSIS — N2581 Secondary hyperparathyroidism of renal origin: Secondary | ICD-10-CM | POA: Diagnosis not present

## 2015-01-20 DIAGNOSIS — N186 End stage renal disease: Secondary | ICD-10-CM | POA: Diagnosis not present

## 2015-01-20 DIAGNOSIS — Z992 Dependence on renal dialysis: Secondary | ICD-10-CM | POA: Diagnosis not present

## 2015-01-20 DIAGNOSIS — D509 Iron deficiency anemia, unspecified: Secondary | ICD-10-CM | POA: Diagnosis not present

## 2015-01-20 DIAGNOSIS — N2581 Secondary hyperparathyroidism of renal origin: Secondary | ICD-10-CM | POA: Diagnosis not present

## 2015-01-22 DIAGNOSIS — N2581 Secondary hyperparathyroidism of renal origin: Secondary | ICD-10-CM | POA: Diagnosis not present

## 2015-01-22 DIAGNOSIS — Z992 Dependence on renal dialysis: Secondary | ICD-10-CM | POA: Diagnosis not present

## 2015-01-22 DIAGNOSIS — D509 Iron deficiency anemia, unspecified: Secondary | ICD-10-CM | POA: Diagnosis not present

## 2015-01-22 DIAGNOSIS — N186 End stage renal disease: Secondary | ICD-10-CM | POA: Diagnosis not present

## 2015-01-27 ENCOUNTER — Other Ambulatory Visit: Payer: Self-pay | Admitting: Vascular Surgery

## 2015-01-28 ENCOUNTER — Encounter
Admission: RE | Disposition: A | Discharge status: Home / Self Care | Payer: Self-pay | Source: Ambulatory Visit | Attending: Vascular Surgery

## 2015-01-28 ENCOUNTER — Ambulatory Visit
Admission: RE | Admit: 2015-01-28 | Discharge: 2015-01-28 | Disposition: A | Discharge status: Home / Self Care | Payer: Medicare Other | Source: Ambulatory Visit | Attending: Vascular Surgery | Admitting: Vascular Surgery

## 2015-01-28 ENCOUNTER — Encounter: Payer: Self-pay | Admitting: *Deleted

## 2015-01-28 DIAGNOSIS — I120 Hypertensive chronic kidney disease with stage 5 chronic kidney disease or end stage renal disease: Secondary | ICD-10-CM | POA: Diagnosis not present

## 2015-01-28 DIAGNOSIS — T82868A Thrombosis of vascular prosthetic devices, implants and grafts, initial encounter: Secondary | ICD-10-CM | POA: Insufficient documentation

## 2015-01-28 DIAGNOSIS — Z7901 Long term (current) use of anticoagulants: Secondary | ICD-10-CM | POA: Diagnosis not present

## 2015-01-28 DIAGNOSIS — M79606 Pain in leg, unspecified: Secondary | ICD-10-CM | POA: Diagnosis not present

## 2015-01-28 DIAGNOSIS — I509 Heart failure, unspecified: Secondary | ICD-10-CM | POA: Diagnosis not present

## 2015-01-28 DIAGNOSIS — T82858A Stenosis of vascular prosthetic devices, implants and grafts, initial encounter: Secondary | ICD-10-CM | POA: Diagnosis not present

## 2015-01-28 DIAGNOSIS — Z992 Dependence on renal dialysis: Secondary | ICD-10-CM | POA: Diagnosis not present

## 2015-01-28 DIAGNOSIS — N186 End stage renal disease: Secondary | ICD-10-CM | POA: Diagnosis not present

## 2015-01-28 DIAGNOSIS — E663 Overweight: Secondary | ICD-10-CM | POA: Diagnosis not present

## 2015-01-28 DIAGNOSIS — Y832 Surgical operation with anastomosis, bypass or graft as the cause of abnormal reaction of the patient, or of later complication, without mention of misadventure at the time of the procedure: Secondary | ICD-10-CM | POA: Insufficient documentation

## 2015-01-28 HISTORY — PX: PERIPHERAL VASCULAR CATHETERIZATION: SHX172C

## 2015-01-28 LAB — POTASSIUM (ARMC VASCULAR LAB ONLY): Potassium (ARMC vascular lab): 5.1

## 2015-01-28 SURGERY — THROMBECTOMY
Anesthesia: Moderate Sedation | Laterality: Right

## 2015-01-28 MED ORDER — DEXTROSE 5 % IV SOLN
INTRAVENOUS | Status: AC
  Administered 2015-01-28: 13:00:00
  Filled 2015-01-28: qty 1.5

## 2015-01-28 MED ORDER — SODIUM CHLORIDE 0.9 % IV SOLN
INTRAVENOUS | Status: DC
  Administered 2015-01-28: 13:00:00 via INTRAVENOUS

## 2015-01-28 MED ORDER — FENTANYL CITRATE (PF) 100 MCG/2ML IJ SOLN
INTRAMUSCULAR | Status: AC
  Filled 2015-01-28: qty 2

## 2015-01-28 MED ORDER — CHLORHEXIDINE GLUCONATE CLOTH 2 % EX PADS: 6.0000 | MEDICATED_PAD | Freq: Once | CUTANEOUS | Status: DC

## 2015-01-28 MED ORDER — LIDOCAINE-EPINEPHRINE (PF) 1 %-1:200000 IJ SOLN
INTRAMUSCULAR | Status: DC | PRN
  Administered 2015-01-28: 10 mL via INTRADERMAL

## 2015-01-28 MED ORDER — HEPARIN SODIUM (PORCINE) 1000 UNIT/ML IJ SOLN
INTRAMUSCULAR | Status: DC | PRN
  Administered 2015-01-28: 4000 [IU] via INTRAVENOUS
  Administered 2015-01-28: 2000 [IU] via INTRAVENOUS

## 2015-01-28 MED ORDER — FENTANYL CITRATE (PF) 100 MCG/2ML IJ SOLN
INTRAMUSCULAR | Status: DC | PRN
  Administered 2015-01-28 (×5): 50 ug via INTRAVENOUS

## 2015-01-28 MED ORDER — ALTEPLASE 2 MG IJ SOLR
INTRAMUSCULAR | Status: AC
  Filled 2015-01-28: qty 4

## 2015-01-28 MED ORDER — MIDAZOLAM HCL 5 MG/5ML IJ SOLN
INTRAMUSCULAR | Status: AC
  Filled 2015-01-28: qty 5

## 2015-01-28 MED ORDER — IOHEXOL 300 MG/ML  SOLN
INTRAMUSCULAR | Status: DC | PRN
  Administered 2015-01-28: 70 mL via INTRA_ARTERIAL

## 2015-01-28 MED ORDER — CEFAZOLIN SODIUM 1-5 GM-% IV SOLN
INTRAVENOUS | Status: AC
  Filled 2015-01-28: qty 50

## 2015-01-28 MED ORDER — BACITRACIN-NEOMYCIN-POLYMYXIN 400-5-5000 EX OINT
TOPICAL_OINTMENT | CUTANEOUS | Status: AC
  Filled 2015-01-28: qty 2

## 2015-01-28 MED ORDER — DEXTROSE 5 % IV SOLN: 1.5000 g | INTRAVENOUS | Status: DC

## 2015-01-28 MED ORDER — MIDAZOLAM HCL 2 MG/2ML IJ SOLN
INTRAMUSCULAR | Status: AC
  Filled 2015-01-28: qty 2

## 2015-01-28 MED ORDER — HEPARIN SODIUM (PORCINE) 1000 UNIT/ML IJ SOLN
INTRAMUSCULAR | Status: AC
  Filled 2015-01-28: qty 1

## 2015-01-28 MED ORDER — LIDOCAINE-EPINEPHRINE (PF) 1 %-1:200000 IJ SOLN
INTRAMUSCULAR | Status: AC
  Filled 2015-01-28: qty 30

## 2015-01-28 MED ORDER — HEPARIN (PORCINE) IN NACL 2-0.9 UNIT/ML-% IJ SOLN
INTRAMUSCULAR | Status: AC
  Filled 2015-01-28: qty 1000

## 2015-01-28 MED ORDER — MIDAZOLAM HCL 2 MG/2ML IJ SOLN
INTRAMUSCULAR | Status: DC | PRN
  Administered 2015-01-28: 1 mg via INTRAVENOUS
  Administered 2015-01-28: 2 mg via INTRAVENOUS
  Administered 2015-01-28: 1 mg via INTRAVENOUS
  Administered 2015-01-28: 2 mg via INTRAVENOUS

## 2015-01-28 SURGICAL SUPPLY — 21 items
BALLN DORADO7X100X80 (BALLOONS) ×3
BALLN LUTONIX 4X150X130 (BALLOONS) ×3
BALLN ULTRVRSE 6X80X75 (BALLOONS) ×3
BALLN ULTRVRSE 8X150X75 (BALLOONS) ×3 IMPLANT
BALLOON DORADO7X100X80 (BALLOONS) ×1 IMPLANT
BALLOON LUTONIX 4X150X130 (BALLOONS) ×1 IMPLANT
BALLOON ULTRVRSE 6X80X75 (BALLOONS) ×1 IMPLANT
CATH EMBOLECTOMY 5FR (BALLOONS) ×3 IMPLANT
CATH TORCON 5FR 0.38 (CATHETERS) ×3 IMPLANT
DEVICE PRESTO INFLATION (MISCELLANEOUS) ×3 IMPLANT
DRAPE BRACHIAL (DRAPES) ×3 IMPLANT
KIT 5FR STIFF NT/TG (MISCELLANEOUS) ×3 IMPLANT
PACK ANGIOGRAPHY (CUSTOM PROCEDURE TRAY) ×3 IMPLANT
SET AVX THROMB ULT (MISCELLANEOUS) ×3 IMPLANT
SHEATH BRITE TIP 6FRX5.5 (SHEATH) ×6 IMPLANT
SHEATH BRITE TIP 7FRX5.5 (SHEATH) ×3 IMPLANT
STENT VIABAHN 8X150X120 (Permanent Stent) ×3 IMPLANT
STENT VIABAHN 8X15X120 7FR (Permanent Stent) ×1 IMPLANT
TOWEL OR 17X26 4PK STRL BLUE (TOWEL DISPOSABLE) ×3 IMPLANT
WIRE G 018X200 V18 (WIRE) ×3 IMPLANT
WIRE MAGIC TORQUE 260C (WIRE) ×6 IMPLANT

## 2015-01-28 NOTE — H&P (Signed)
  [facility] History & Physical Update  The patient was interviewed and re-examined.  The patient's previous History and Physical has been reviewed and graft is clotted again.  There is no change in the plan of care. We plan to proceed with the scheduled procedure.  Payslee Bateson, MD  01/28/2015, 12:22 PM

## 2015-01-28 NOTE — Op Note (Signed)
[name] [name]    OPERATIVE NOTE   PROCEDURE: 1.  Right arm HeRO graft cannulation under ultrasound guidance in both a retrograde and then antegrade fashion crossing 2.  Right arm shuntogram and central venogram 3.  Catheter directed thrombolysis with 4 mg of TPA delivered with the AngioJet AVX catheter 4.  Mechanical rheolytic thrombectomy to the HeRO graft with the AngioJet AVX catheter 5.  Fogarty embolectomy for residual arterial plug 6.  Mechanical rheolytic thrombectomy to the brachial artery and ulnar artery with the Angiojet AVX catheter 7.  Percutaneous transluminal angioplasty of arterial anastomosis with 7 mm diameter angioplasty balloon 8.  Percutaneous transluminal angioplasty of the brachial artery beyond the anastomosis with 6 mm diameter x 8 cm length angioplasty balloon 9.  Percutaneous transluminal angioplasty of the ulnar artery with 4 mm diameter x 15 cm length Lutonix drug coated angioplasty balloon 10. Percutaneous transluminal angioplasty of the radial artery with 4 mm diameter x 15 cm length angioplasty balloon 11. Percutaneous transluminal angioplasty of the mid and distal HeRO graft with 7 mm diameter x 10 cm length angioplasty balloon x 2 12. Viabahn covered stent placement to the mid and distal pTFE portions of the HeRO graft with a 8 mm diameter x 15 cm length stent 13. Catheter placement into the right radial and ulnar arteries from the retrograde sheath and RUE angiogram  PRE-OPERATIVE DIAGNOSIS: 1. ESRD 2.  Thrombosed right arm HeRO graft and thrombosis distal to the graft in the brachial, radial, and ulnar arteries  POST-OPERATIVE DIAGNOSIS: same as above   SURGEON: [name] Pain, MD  ANESTHESIA: local with MCS  ESTIMATED BLOOD LOSS: 50 cc  FINDING(S): 1. Thrombosed HeRO graft, opened with intervention  SPECIMEN(S):  None  CONTRAST: 80 cc  INDICATIONS: Patient is a 49 yo male who presents with a thrombosed right arm HeRO graft.   The patient is scheduled for an attempted declot and shuntogram.  The patient is aware the risks include but are not limited to: bleeding, infection, thrombosis of the cannulated access, and possible anaphylactic reaction to the contrast.  The patient is aware of the risks of the procedure and elects to proceed forward.  DESCRIPTION: After full informed written consent was obtained, the patient was brought back to the angiography suite and placed supine upon the angiography table.  The patient was connected to monitoring equipment.  The right arm was prepped and draped in the standard fashion for a percutaneous access intervention.  Under ultrasound guidance, the right arm HeRO graft was cannulated with a micropuncture needle under direct ultrasound guidance due to the pulseless nature of the graft in both an antegrade and a retrograde fashion crossing, and permanent images were performed.  The microwire was advanced and the needle was exchanged for the a microsheath.  I then upsized to a 6 Fr Sheath and imaging was performed.  Hand injections were completed to image the access including the central venous system. This demonstrated no flow within the HeRO graft.  Based on the images, this patient will need extensive treatment to salvage the graft. I then gave the patient 4000 units of intravenous heparin and later an additional 2000 units were given.  I then placed a Magic torque wire into the brachial artery from the retrograde sheath and into the right atrium from the antegrade sheath. 4 mg of TPA were deployed throughout the entirety of the PTFE portion of the graft and into the brachial artery at the anastomosis. This was allowed to  dwell for 10-15 minutes. Mechanical rheolytic thrombectomy was then performed throughout the PTFE portion of the HeRO graft and into the brachial artery at the anastomosis. This uncovered thrombus in the graft, thrombus in the brachial artery beyond the graft and a residual  arterial plug was also seen at the arterial anastomosis. An attempt to clear the arterial plug was done with three passes of the Fogarty embolectomy balloon. Flow-limiting arterial plug remained, and I elected to treat this lesion with a 7 mm diameter x 10 cm length high angioplasty balloon inflated to 20 atm for one minute. This resulted in resolution of the arterial plug, and clearance of the arterial side of the graft, but thrombus distal to the graft in the brachial, ulnar, and radial arteries were seen.   I then advanced the Kumpe catheter with the Magic torque wire into the brachial artery and initially into the ulnar artery. I performed mechanical rheolytic thrombectomy and the brachial artery distal to the graft as well as the ulnar artery with the AngioJet AVX catheter. Had already used about 150 cc of effluent within the graft, and approximately 60 cc more of effluent was returned from the arterial circulation distally. Some flow was seen, but there were still significant residual thrombosis. I treated the brachial artery with a 6 mm diameter by 8 cm length angioplasty balloon inflated to 10 atm for 1 minute. The ulnar artery I treated with a 4 mm diameter by 15 cm length Lutonix drug-coated angioplasty balloon to the mid ulnar artery. A channel was then seen and improved, but a large plug of thrombus was seen at the brachial bifurcation going into the radial artery as well. I then used a Kumpe catheter to cannulate the radial artery and advanced the Magic torque wire distally. The radial artery and distal brachial artery were treated with the 4 mm diameter by 15 cm length balloon inflated to 12 atm for 1 minute. Following these interventions to the radial, ulnar, and brachial arteries a Kumpe catheter was placed back into the brachial artery. Imaging showed in-line flow through the radial artery downstream to the hand. There was still thrombus within the ulnar artery but distal reconstitution was seen. We  had now gotten his arterial outflow to his hand to a reasonable situation, and we turned our attention to trying to salvage the graft again. The retrograde sheath was removed as a 4-0 Monocryl pursestring suture was placed. I then turned my attention to the thrombus in the distal graft. Mechanical rheolytic thrombectomy was performed again.  Another 100 or so cc of effluent was returned. There was still however significant thrombus and narrowing within the mid and distal portions of the graft.  I performed balloon angioplasty with a 7 mm diameter by 10 cm length high pressure balloon inflated to 16 atm for 1 minute twice.This resulted in  Improvement but there was still residual thrombus within the old stents in the graft.  I then elected to treat this with  A covered stent. I exchanged for a 7 French sheath and a 0.018 wire. The mid to distal graft was then treated with an 8 mm diameter by 15 cm length Viabahn covered stent postdilated with an 8 mm balloon with an excellent angiographic completion result and brisk flow through the graft.    Based on the completion imaging, no further intervention is necessary.  The wire and balloon were removed from the sheath.  A 4-0 Monocryl purse-string suture was sewn around the sheath.  The  sheath was removed while tying down the suture.  A sterile bandage was applied to the puncture site.  COMPLICATIONS: None  CONDITION: Stable   Shaquill Iseman 01/28/2015 3:58 PM

## 2015-01-29 DIAGNOSIS — N186 End stage renal disease: Secondary | ICD-10-CM | POA: Diagnosis not present

## 2015-01-29 DIAGNOSIS — Z992 Dependence on renal dialysis: Secondary | ICD-10-CM | POA: Diagnosis not present

## 2015-01-29 DIAGNOSIS — N2581 Secondary hyperparathyroidism of renal origin: Secondary | ICD-10-CM | POA: Diagnosis not present

## 2015-01-29 DIAGNOSIS — D509 Iron deficiency anemia, unspecified: Secondary | ICD-10-CM | POA: Diagnosis not present

## 2015-01-30 ENCOUNTER — Encounter: Payer: Self-pay | Admitting: Vascular Surgery

## 2015-01-31 DIAGNOSIS — Z992 Dependence on renal dialysis: Secondary | ICD-10-CM | POA: Diagnosis not present

## 2015-01-31 DIAGNOSIS — N2581 Secondary hyperparathyroidism of renal origin: Secondary | ICD-10-CM | POA: Diagnosis not present

## 2015-01-31 DIAGNOSIS — D509 Iron deficiency anemia, unspecified: Secondary | ICD-10-CM | POA: Diagnosis not present

## 2015-01-31 DIAGNOSIS — N186 End stage renal disease: Secondary | ICD-10-CM | POA: Diagnosis not present

## 2015-02-03 DIAGNOSIS — Z992 Dependence on renal dialysis: Secondary | ICD-10-CM | POA: Diagnosis not present

## 2015-02-03 DIAGNOSIS — N186 End stage renal disease: Secondary | ICD-10-CM | POA: Diagnosis not present

## 2015-02-03 DIAGNOSIS — D509 Iron deficiency anemia, unspecified: Secondary | ICD-10-CM | POA: Diagnosis not present

## 2015-02-03 DIAGNOSIS — N2581 Secondary hyperparathyroidism of renal origin: Secondary | ICD-10-CM | POA: Diagnosis not present

## 2015-02-05 ENCOUNTER — Telehealth: Payer: Self-pay | Admitting: *Deleted

## 2015-02-05 DIAGNOSIS — Z992 Dependence on renal dialysis: Secondary | ICD-10-CM | POA: Diagnosis not present

## 2015-02-05 DIAGNOSIS — N2581 Secondary hyperparathyroidism of renal origin: Secondary | ICD-10-CM | POA: Diagnosis not present

## 2015-02-05 DIAGNOSIS — N186 End stage renal disease: Secondary | ICD-10-CM | POA: Diagnosis not present

## 2015-02-05 DIAGNOSIS — D509 Iron deficiency anemia, unspecified: Secondary | ICD-10-CM | POA: Diagnosis not present

## 2015-02-07 DIAGNOSIS — N2581 Secondary hyperparathyroidism of renal origin: Secondary | ICD-10-CM | POA: Diagnosis not present

## 2015-02-07 DIAGNOSIS — Z992 Dependence on renal dialysis: Secondary | ICD-10-CM | POA: Diagnosis not present

## 2015-02-07 DIAGNOSIS — N186 End stage renal disease: Secondary | ICD-10-CM | POA: Diagnosis not present

## 2015-02-07 DIAGNOSIS — D509 Iron deficiency anemia, unspecified: Secondary | ICD-10-CM | POA: Diagnosis not present

## 2015-02-10 DIAGNOSIS — Z992 Dependence on renal dialysis: Secondary | ICD-10-CM | POA: Diagnosis not present

## 2015-02-10 DIAGNOSIS — N186 End stage renal disease: Secondary | ICD-10-CM | POA: Diagnosis not present

## 2015-02-10 DIAGNOSIS — N2581 Secondary hyperparathyroidism of renal origin: Secondary | ICD-10-CM | POA: Diagnosis not present

## 2015-02-10 DIAGNOSIS — D509 Iron deficiency anemia, unspecified: Secondary | ICD-10-CM | POA: Diagnosis not present

## 2015-02-12 DIAGNOSIS — N2581 Secondary hyperparathyroidism of renal origin: Secondary | ICD-10-CM | POA: Diagnosis not present

## 2015-02-12 DIAGNOSIS — D509 Iron deficiency anemia, unspecified: Secondary | ICD-10-CM | POA: Diagnosis not present

## 2015-02-12 DIAGNOSIS — Z992 Dependence on renal dialysis: Secondary | ICD-10-CM | POA: Diagnosis not present

## 2015-02-12 DIAGNOSIS — N186 End stage renal disease: Secondary | ICD-10-CM | POA: Diagnosis not present

## 2015-02-14 DIAGNOSIS — Z992 Dependence on renal dialysis: Secondary | ICD-10-CM | POA: Diagnosis not present

## 2015-02-14 DIAGNOSIS — N2581 Secondary hyperparathyroidism of renal origin: Secondary | ICD-10-CM | POA: Diagnosis not present

## 2015-02-14 DIAGNOSIS — N186 End stage renal disease: Secondary | ICD-10-CM | POA: Diagnosis not present

## 2015-02-14 DIAGNOSIS — D509 Iron deficiency anemia, unspecified: Secondary | ICD-10-CM | POA: Diagnosis not present

## 2015-02-16 DIAGNOSIS — Z992 Dependence on renal dialysis: Secondary | ICD-10-CM | POA: Diagnosis not present

## 2015-02-16 DIAGNOSIS — N186 End stage renal disease: Secondary | ICD-10-CM | POA: Diagnosis not present

## 2015-02-17 DIAGNOSIS — N2581 Secondary hyperparathyroidism of renal origin: Secondary | ICD-10-CM | POA: Diagnosis not present

## 2015-02-17 DIAGNOSIS — Z992 Dependence on renal dialysis: Secondary | ICD-10-CM | POA: Diagnosis not present

## 2015-02-17 DIAGNOSIS — N186 End stage renal disease: Secondary | ICD-10-CM | POA: Diagnosis not present

## 2015-02-17 DIAGNOSIS — E611 Iron deficiency: Secondary | ICD-10-CM | POA: Diagnosis not present

## 2015-02-19 DIAGNOSIS — N186 End stage renal disease: Secondary | ICD-10-CM | POA: Diagnosis not present

## 2015-02-19 DIAGNOSIS — Z992 Dependence on renal dialysis: Secondary | ICD-10-CM | POA: Diagnosis not present

## 2015-02-19 DIAGNOSIS — N2581 Secondary hyperparathyroidism of renal origin: Secondary | ICD-10-CM | POA: Diagnosis not present

## 2015-02-19 DIAGNOSIS — E611 Iron deficiency: Secondary | ICD-10-CM | POA: Diagnosis not present

## 2015-02-21 DIAGNOSIS — E611 Iron deficiency: Secondary | ICD-10-CM | POA: Diagnosis not present

## 2015-02-21 DIAGNOSIS — N186 End stage renal disease: Secondary | ICD-10-CM | POA: Diagnosis not present

## 2015-02-21 DIAGNOSIS — N2581 Secondary hyperparathyroidism of renal origin: Secondary | ICD-10-CM | POA: Diagnosis not present

## 2015-02-21 DIAGNOSIS — Z992 Dependence on renal dialysis: Secondary | ICD-10-CM | POA: Diagnosis not present

## 2015-02-24 DIAGNOSIS — N186 End stage renal disease: Secondary | ICD-10-CM | POA: Diagnosis not present

## 2015-02-24 DIAGNOSIS — N2581 Secondary hyperparathyroidism of renal origin: Secondary | ICD-10-CM | POA: Diagnosis not present

## 2015-02-24 DIAGNOSIS — E611 Iron deficiency: Secondary | ICD-10-CM | POA: Diagnosis not present

## 2015-02-24 DIAGNOSIS — Z992 Dependence on renal dialysis: Secondary | ICD-10-CM | POA: Diagnosis not present

## 2015-02-26 DIAGNOSIS — E611 Iron deficiency: Secondary | ICD-10-CM | POA: Diagnosis not present

## 2015-02-26 DIAGNOSIS — Z992 Dependence on renal dialysis: Secondary | ICD-10-CM | POA: Diagnosis not present

## 2015-02-26 DIAGNOSIS — N2581 Secondary hyperparathyroidism of renal origin: Secondary | ICD-10-CM | POA: Diagnosis not present

## 2015-02-26 DIAGNOSIS — N186 End stage renal disease: Secondary | ICD-10-CM | POA: Diagnosis not present

## 2015-02-28 DIAGNOSIS — E611 Iron deficiency: Secondary | ICD-10-CM | POA: Diagnosis not present

## 2015-02-28 DIAGNOSIS — N2581 Secondary hyperparathyroidism of renal origin: Secondary | ICD-10-CM | POA: Diagnosis not present

## 2015-02-28 DIAGNOSIS — Z992 Dependence on renal dialysis: Secondary | ICD-10-CM | POA: Diagnosis not present

## 2015-02-28 DIAGNOSIS — N186 End stage renal disease: Secondary | ICD-10-CM | POA: Diagnosis not present

## 2015-03-03 DIAGNOSIS — N186 End stage renal disease: Secondary | ICD-10-CM | POA: Diagnosis not present

## 2015-03-03 DIAGNOSIS — Z992 Dependence on renal dialysis: Secondary | ICD-10-CM | POA: Diagnosis not present

## 2015-03-03 DIAGNOSIS — E611 Iron deficiency: Secondary | ICD-10-CM | POA: Diagnosis not present

## 2015-03-03 DIAGNOSIS — N2581 Secondary hyperparathyroidism of renal origin: Secondary | ICD-10-CM | POA: Diagnosis not present

## 2015-03-05 DIAGNOSIS — N186 End stage renal disease: Secondary | ICD-10-CM | POA: Diagnosis not present

## 2015-03-05 DIAGNOSIS — Z992 Dependence on renal dialysis: Secondary | ICD-10-CM | POA: Diagnosis not present

## 2015-03-05 DIAGNOSIS — E611 Iron deficiency: Secondary | ICD-10-CM | POA: Diagnosis not present

## 2015-03-05 DIAGNOSIS — N2581 Secondary hyperparathyroidism of renal origin: Secondary | ICD-10-CM | POA: Diagnosis not present

## 2015-03-06 DIAGNOSIS — Y841 Kidney dialysis as the cause of abnormal reaction of the patient, or of later complication, without mention of misadventure at the time of the procedure: Secondary | ICD-10-CM | POA: Diagnosis not present

## 2015-03-06 DIAGNOSIS — Z992 Dependence on renal dialysis: Secondary | ICD-10-CM | POA: Diagnosis not present

## 2015-03-06 DIAGNOSIS — N186 End stage renal disease: Secondary | ICD-10-CM | POA: Diagnosis not present

## 2015-03-06 DIAGNOSIS — T82318A Breakdown (mechanical) of other vascular grafts, initial encounter: Secondary | ICD-10-CM | POA: Diagnosis not present

## 2015-03-06 DIAGNOSIS — T82858A Stenosis of vascular prosthetic devices, implants and grafts, initial encounter: Secondary | ICD-10-CM | POA: Diagnosis not present

## 2015-03-07 DIAGNOSIS — N2581 Secondary hyperparathyroidism of renal origin: Secondary | ICD-10-CM | POA: Diagnosis not present

## 2015-03-07 DIAGNOSIS — Z992 Dependence on renal dialysis: Secondary | ICD-10-CM | POA: Diagnosis not present

## 2015-03-07 DIAGNOSIS — N186 End stage renal disease: Secondary | ICD-10-CM | POA: Diagnosis not present

## 2015-03-07 DIAGNOSIS — E611 Iron deficiency: Secondary | ICD-10-CM | POA: Diagnosis not present

## 2015-03-10 DIAGNOSIS — E611 Iron deficiency: Secondary | ICD-10-CM | POA: Diagnosis not present

## 2015-03-10 DIAGNOSIS — N186 End stage renal disease: Secondary | ICD-10-CM | POA: Diagnosis not present

## 2015-03-10 DIAGNOSIS — N2581 Secondary hyperparathyroidism of renal origin: Secondary | ICD-10-CM | POA: Diagnosis not present

## 2015-03-10 DIAGNOSIS — Z992 Dependence on renal dialysis: Secondary | ICD-10-CM | POA: Diagnosis not present

## 2015-03-12 DIAGNOSIS — N2581 Secondary hyperparathyroidism of renal origin: Secondary | ICD-10-CM | POA: Diagnosis not present

## 2015-03-12 DIAGNOSIS — Z992 Dependence on renal dialysis: Secondary | ICD-10-CM | POA: Diagnosis not present

## 2015-03-12 DIAGNOSIS — N186 End stage renal disease: Secondary | ICD-10-CM | POA: Diagnosis not present

## 2015-03-12 DIAGNOSIS — E611 Iron deficiency: Secondary | ICD-10-CM | POA: Diagnosis not present

## 2015-03-14 DIAGNOSIS — E611 Iron deficiency: Secondary | ICD-10-CM | POA: Diagnosis not present

## 2015-03-14 DIAGNOSIS — Z992 Dependence on renal dialysis: Secondary | ICD-10-CM | POA: Diagnosis not present

## 2015-03-14 DIAGNOSIS — N186 End stage renal disease: Secondary | ICD-10-CM | POA: Diagnosis not present

## 2015-03-14 DIAGNOSIS — N2581 Secondary hyperparathyroidism of renal origin: Secondary | ICD-10-CM | POA: Diagnosis not present

## 2015-03-17 DIAGNOSIS — Z992 Dependence on renal dialysis: Secondary | ICD-10-CM | POA: Diagnosis not present

## 2015-03-17 DIAGNOSIS — N2581 Secondary hyperparathyroidism of renal origin: Secondary | ICD-10-CM | POA: Diagnosis not present

## 2015-03-17 DIAGNOSIS — E611 Iron deficiency: Secondary | ICD-10-CM | POA: Diagnosis not present

## 2015-03-17 DIAGNOSIS — N186 End stage renal disease: Secondary | ICD-10-CM | POA: Diagnosis not present

## 2015-03-18 DIAGNOSIS — Z992 Dependence on renal dialysis: Secondary | ICD-10-CM | POA: Diagnosis not present

## 2015-03-18 DIAGNOSIS — N186 End stage renal disease: Secondary | ICD-10-CM | POA: Diagnosis not present

## 2015-03-19 DIAGNOSIS — N2581 Secondary hyperparathyroidism of renal origin: Secondary | ICD-10-CM | POA: Diagnosis not present

## 2015-03-19 DIAGNOSIS — E611 Iron deficiency: Secondary | ICD-10-CM | POA: Diagnosis not present

## 2015-03-19 DIAGNOSIS — N186 End stage renal disease: Secondary | ICD-10-CM | POA: Diagnosis not present

## 2015-03-19 DIAGNOSIS — Z992 Dependence on renal dialysis: Secondary | ICD-10-CM | POA: Diagnosis not present

## 2015-03-21 DIAGNOSIS — Z992 Dependence on renal dialysis: Secondary | ICD-10-CM | POA: Diagnosis not present

## 2015-03-21 DIAGNOSIS — N2581 Secondary hyperparathyroidism of renal origin: Secondary | ICD-10-CM | POA: Diagnosis not present

## 2015-03-21 DIAGNOSIS — E611 Iron deficiency: Secondary | ICD-10-CM | POA: Diagnosis not present

## 2015-03-21 DIAGNOSIS — N186 End stage renal disease: Secondary | ICD-10-CM | POA: Diagnosis not present

## 2015-03-24 DIAGNOSIS — N2581 Secondary hyperparathyroidism of renal origin: Secondary | ICD-10-CM | POA: Diagnosis not present

## 2015-03-24 DIAGNOSIS — Z992 Dependence on renal dialysis: Secondary | ICD-10-CM | POA: Diagnosis not present

## 2015-03-24 DIAGNOSIS — E611 Iron deficiency: Secondary | ICD-10-CM | POA: Diagnosis not present

## 2015-03-24 DIAGNOSIS — N186 End stage renal disease: Secondary | ICD-10-CM | POA: Diagnosis not present

## 2015-03-26 DIAGNOSIS — N186 End stage renal disease: Secondary | ICD-10-CM | POA: Diagnosis not present

## 2015-03-26 DIAGNOSIS — Z992 Dependence on renal dialysis: Secondary | ICD-10-CM | POA: Diagnosis not present

## 2015-03-26 DIAGNOSIS — N2581 Secondary hyperparathyroidism of renal origin: Secondary | ICD-10-CM | POA: Diagnosis not present

## 2015-03-26 DIAGNOSIS — E611 Iron deficiency: Secondary | ICD-10-CM | POA: Diagnosis not present

## 2015-03-28 DIAGNOSIS — N2581 Secondary hyperparathyroidism of renal origin: Secondary | ICD-10-CM | POA: Diagnosis not present

## 2015-03-28 DIAGNOSIS — E611 Iron deficiency: Secondary | ICD-10-CM | POA: Diagnosis not present

## 2015-03-28 DIAGNOSIS — N186 End stage renal disease: Secondary | ICD-10-CM | POA: Diagnosis not present

## 2015-03-28 DIAGNOSIS — Z992 Dependence on renal dialysis: Secondary | ICD-10-CM | POA: Diagnosis not present

## 2015-03-31 DIAGNOSIS — N186 End stage renal disease: Secondary | ICD-10-CM | POA: Diagnosis not present

## 2015-03-31 DIAGNOSIS — Z992 Dependence on renal dialysis: Secondary | ICD-10-CM | POA: Diagnosis not present

## 2015-03-31 DIAGNOSIS — N2581 Secondary hyperparathyroidism of renal origin: Secondary | ICD-10-CM | POA: Diagnosis not present

## 2015-03-31 DIAGNOSIS — E611 Iron deficiency: Secondary | ICD-10-CM | POA: Diagnosis not present

## 2015-04-02 DIAGNOSIS — N2581 Secondary hyperparathyroidism of renal origin: Secondary | ICD-10-CM | POA: Diagnosis not present

## 2015-04-02 DIAGNOSIS — E611 Iron deficiency: Secondary | ICD-10-CM | POA: Diagnosis not present

## 2015-04-02 DIAGNOSIS — N186 End stage renal disease: Secondary | ICD-10-CM | POA: Diagnosis not present

## 2015-04-02 DIAGNOSIS — Z992 Dependence on renal dialysis: Secondary | ICD-10-CM | POA: Diagnosis not present

## 2015-04-04 DIAGNOSIS — E611 Iron deficiency: Secondary | ICD-10-CM | POA: Diagnosis not present

## 2015-04-04 DIAGNOSIS — N186 End stage renal disease: Secondary | ICD-10-CM | POA: Diagnosis not present

## 2015-04-04 DIAGNOSIS — N2581 Secondary hyperparathyroidism of renal origin: Secondary | ICD-10-CM | POA: Diagnosis not present

## 2015-04-04 DIAGNOSIS — Z992 Dependence on renal dialysis: Secondary | ICD-10-CM | POA: Diagnosis not present

## 2015-04-07 DIAGNOSIS — N186 End stage renal disease: Secondary | ICD-10-CM | POA: Diagnosis not present

## 2015-04-07 DIAGNOSIS — E611 Iron deficiency: Secondary | ICD-10-CM | POA: Diagnosis not present

## 2015-04-07 DIAGNOSIS — N2581 Secondary hyperparathyroidism of renal origin: Secondary | ICD-10-CM | POA: Diagnosis not present

## 2015-04-07 DIAGNOSIS — Z992 Dependence on renal dialysis: Secondary | ICD-10-CM | POA: Diagnosis not present

## 2015-04-09 DIAGNOSIS — Z992 Dependence on renal dialysis: Secondary | ICD-10-CM | POA: Diagnosis not present

## 2015-04-09 DIAGNOSIS — N186 End stage renal disease: Secondary | ICD-10-CM | POA: Diagnosis not present

## 2015-04-09 DIAGNOSIS — N2581 Secondary hyperparathyroidism of renal origin: Secondary | ICD-10-CM | POA: Diagnosis not present

## 2015-04-09 DIAGNOSIS — E611 Iron deficiency: Secondary | ICD-10-CM | POA: Diagnosis not present

## 2015-04-11 DIAGNOSIS — Z992 Dependence on renal dialysis: Secondary | ICD-10-CM | POA: Diagnosis not present

## 2015-04-11 DIAGNOSIS — N186 End stage renal disease: Secondary | ICD-10-CM | POA: Diagnosis not present

## 2015-04-11 DIAGNOSIS — E611 Iron deficiency: Secondary | ICD-10-CM | POA: Diagnosis not present

## 2015-04-11 DIAGNOSIS — N2581 Secondary hyperparathyroidism of renal origin: Secondary | ICD-10-CM | POA: Diagnosis not present

## 2015-04-14 DIAGNOSIS — N186 End stage renal disease: Secondary | ICD-10-CM | POA: Diagnosis not present

## 2015-04-14 DIAGNOSIS — N2581 Secondary hyperparathyroidism of renal origin: Secondary | ICD-10-CM | POA: Diagnosis not present

## 2015-04-14 DIAGNOSIS — E611 Iron deficiency: Secondary | ICD-10-CM | POA: Diagnosis not present

## 2015-04-14 DIAGNOSIS — Z992 Dependence on renal dialysis: Secondary | ICD-10-CM | POA: Diagnosis not present

## 2015-04-16 DIAGNOSIS — E611 Iron deficiency: Secondary | ICD-10-CM | POA: Diagnosis not present

## 2015-04-16 DIAGNOSIS — Z992 Dependence on renal dialysis: Secondary | ICD-10-CM | POA: Diagnosis not present

## 2015-04-16 DIAGNOSIS — N2581 Secondary hyperparathyroidism of renal origin: Secondary | ICD-10-CM | POA: Diagnosis not present

## 2015-04-16 DIAGNOSIS — N186 End stage renal disease: Secondary | ICD-10-CM | POA: Diagnosis not present

## 2015-04-18 DIAGNOSIS — E611 Iron deficiency: Secondary | ICD-10-CM | POA: Diagnosis not present

## 2015-04-18 DIAGNOSIS — N2581 Secondary hyperparathyroidism of renal origin: Secondary | ICD-10-CM | POA: Diagnosis not present

## 2015-04-18 DIAGNOSIS — Z992 Dependence on renal dialysis: Secondary | ICD-10-CM | POA: Diagnosis not present

## 2015-04-18 DIAGNOSIS — N186 End stage renal disease: Secondary | ICD-10-CM | POA: Diagnosis not present

## 2015-04-21 DIAGNOSIS — N2581 Secondary hyperparathyroidism of renal origin: Secondary | ICD-10-CM | POA: Diagnosis not present

## 2015-04-21 DIAGNOSIS — Z992 Dependence on renal dialysis: Secondary | ICD-10-CM | POA: Diagnosis not present

## 2015-04-21 DIAGNOSIS — N186 End stage renal disease: Secondary | ICD-10-CM | POA: Diagnosis not present

## 2015-04-21 DIAGNOSIS — D509 Iron deficiency anemia, unspecified: Secondary | ICD-10-CM | POA: Diagnosis not present

## 2015-04-21 DIAGNOSIS — E611 Iron deficiency: Secondary | ICD-10-CM | POA: Diagnosis not present

## 2015-04-23 DIAGNOSIS — N2581 Secondary hyperparathyroidism of renal origin: Secondary | ICD-10-CM | POA: Diagnosis not present

## 2015-04-23 DIAGNOSIS — D509 Iron deficiency anemia, unspecified: Secondary | ICD-10-CM | POA: Diagnosis not present

## 2015-04-23 DIAGNOSIS — E611 Iron deficiency: Secondary | ICD-10-CM | POA: Diagnosis not present

## 2015-04-23 DIAGNOSIS — N186 End stage renal disease: Secondary | ICD-10-CM | POA: Diagnosis not present

## 2015-04-23 DIAGNOSIS — Z992 Dependence on renal dialysis: Secondary | ICD-10-CM | POA: Diagnosis not present

## 2015-04-28 DIAGNOSIS — Z992 Dependence on renal dialysis: Secondary | ICD-10-CM | POA: Diagnosis not present

## 2015-04-28 DIAGNOSIS — E611 Iron deficiency: Secondary | ICD-10-CM | POA: Diagnosis not present

## 2015-04-28 DIAGNOSIS — N2581 Secondary hyperparathyroidism of renal origin: Secondary | ICD-10-CM | POA: Diagnosis not present

## 2015-04-28 DIAGNOSIS — D509 Iron deficiency anemia, unspecified: Secondary | ICD-10-CM | POA: Diagnosis not present

## 2015-04-28 DIAGNOSIS — N186 End stage renal disease: Secondary | ICD-10-CM | POA: Diagnosis not present

## 2015-04-30 DIAGNOSIS — Z992 Dependence on renal dialysis: Secondary | ICD-10-CM | POA: Diagnosis not present

## 2015-04-30 DIAGNOSIS — N186 End stage renal disease: Secondary | ICD-10-CM | POA: Diagnosis not present

## 2015-04-30 DIAGNOSIS — E611 Iron deficiency: Secondary | ICD-10-CM | POA: Diagnosis not present

## 2015-04-30 DIAGNOSIS — N2581 Secondary hyperparathyroidism of renal origin: Secondary | ICD-10-CM | POA: Diagnosis not present

## 2015-04-30 DIAGNOSIS — D509 Iron deficiency anemia, unspecified: Secondary | ICD-10-CM | POA: Diagnosis not present

## 2015-05-02 DIAGNOSIS — Z992 Dependence on renal dialysis: Secondary | ICD-10-CM | POA: Diagnosis not present

## 2015-05-02 DIAGNOSIS — N2581 Secondary hyperparathyroidism of renal origin: Secondary | ICD-10-CM | POA: Diagnosis not present

## 2015-05-02 DIAGNOSIS — E611 Iron deficiency: Secondary | ICD-10-CM | POA: Diagnosis not present

## 2015-05-02 DIAGNOSIS — D509 Iron deficiency anemia, unspecified: Secondary | ICD-10-CM | POA: Diagnosis not present

## 2015-05-02 DIAGNOSIS — N186 End stage renal disease: Secondary | ICD-10-CM | POA: Diagnosis not present

## 2015-05-05 DIAGNOSIS — N186 End stage renal disease: Secondary | ICD-10-CM | POA: Diagnosis not present

## 2015-05-05 DIAGNOSIS — D509 Iron deficiency anemia, unspecified: Secondary | ICD-10-CM | POA: Diagnosis not present

## 2015-05-05 DIAGNOSIS — Z992 Dependence on renal dialysis: Secondary | ICD-10-CM | POA: Diagnosis not present

## 2015-05-05 DIAGNOSIS — N2581 Secondary hyperparathyroidism of renal origin: Secondary | ICD-10-CM | POA: Diagnosis not present

## 2015-05-05 DIAGNOSIS — E611 Iron deficiency: Secondary | ICD-10-CM | POA: Diagnosis not present

## 2015-05-07 DIAGNOSIS — D509 Iron deficiency anemia, unspecified: Secondary | ICD-10-CM | POA: Diagnosis not present

## 2015-05-07 DIAGNOSIS — N2581 Secondary hyperparathyroidism of renal origin: Secondary | ICD-10-CM | POA: Diagnosis not present

## 2015-05-07 DIAGNOSIS — Z992 Dependence on renal dialysis: Secondary | ICD-10-CM | POA: Diagnosis not present

## 2015-05-07 DIAGNOSIS — E611 Iron deficiency: Secondary | ICD-10-CM | POA: Diagnosis not present

## 2015-05-07 DIAGNOSIS — N186 End stage renal disease: Secondary | ICD-10-CM | POA: Diagnosis not present

## 2015-05-09 DIAGNOSIS — D509 Iron deficiency anemia, unspecified: Secondary | ICD-10-CM | POA: Diagnosis not present

## 2015-05-09 DIAGNOSIS — N2581 Secondary hyperparathyroidism of renal origin: Secondary | ICD-10-CM | POA: Diagnosis not present

## 2015-05-09 DIAGNOSIS — E611 Iron deficiency: Secondary | ICD-10-CM | POA: Diagnosis not present

## 2015-05-09 DIAGNOSIS — Z992 Dependence on renal dialysis: Secondary | ICD-10-CM | POA: Diagnosis not present

## 2015-05-09 DIAGNOSIS — N186 End stage renal disease: Secondary | ICD-10-CM | POA: Diagnosis not present

## 2015-05-12 DIAGNOSIS — E611 Iron deficiency: Secondary | ICD-10-CM | POA: Diagnosis not present

## 2015-05-12 DIAGNOSIS — N2581 Secondary hyperparathyroidism of renal origin: Secondary | ICD-10-CM | POA: Diagnosis not present

## 2015-05-12 DIAGNOSIS — Z992 Dependence on renal dialysis: Secondary | ICD-10-CM | POA: Diagnosis not present

## 2015-05-12 DIAGNOSIS — D509 Iron deficiency anemia, unspecified: Secondary | ICD-10-CM | POA: Diagnosis not present

## 2015-05-12 DIAGNOSIS — N186 End stage renal disease: Secondary | ICD-10-CM | POA: Diagnosis not present

## 2015-05-14 DIAGNOSIS — N186 End stage renal disease: Secondary | ICD-10-CM | POA: Diagnosis not present

## 2015-05-14 DIAGNOSIS — N2581 Secondary hyperparathyroidism of renal origin: Secondary | ICD-10-CM | POA: Diagnosis not present

## 2015-05-14 DIAGNOSIS — E611 Iron deficiency: Secondary | ICD-10-CM | POA: Diagnosis not present

## 2015-05-14 DIAGNOSIS — Z992 Dependence on renal dialysis: Secondary | ICD-10-CM | POA: Diagnosis not present

## 2015-05-14 DIAGNOSIS — D509 Iron deficiency anemia, unspecified: Secondary | ICD-10-CM | POA: Diagnosis not present

## 2015-05-16 DIAGNOSIS — Z992 Dependence on renal dialysis: Secondary | ICD-10-CM | POA: Diagnosis not present

## 2015-05-16 DIAGNOSIS — E611 Iron deficiency: Secondary | ICD-10-CM | POA: Diagnosis not present

## 2015-05-16 DIAGNOSIS — D509 Iron deficiency anemia, unspecified: Secondary | ICD-10-CM | POA: Diagnosis not present

## 2015-05-16 DIAGNOSIS — N2581 Secondary hyperparathyroidism of renal origin: Secondary | ICD-10-CM | POA: Diagnosis not present

## 2015-05-16 DIAGNOSIS — N186 End stage renal disease: Secondary | ICD-10-CM | POA: Diagnosis not present

## 2015-05-19 DIAGNOSIS — Z992 Dependence on renal dialysis: Secondary | ICD-10-CM | POA: Diagnosis not present

## 2015-05-19 DIAGNOSIS — N186 End stage renal disease: Secondary | ICD-10-CM | POA: Diagnosis not present

## 2015-05-19 DIAGNOSIS — N2581 Secondary hyperparathyroidism of renal origin: Secondary | ICD-10-CM | POA: Diagnosis not present

## 2015-05-19 DIAGNOSIS — D509 Iron deficiency anemia, unspecified: Secondary | ICD-10-CM | POA: Diagnosis not present

## 2015-05-19 DIAGNOSIS — E611 Iron deficiency: Secondary | ICD-10-CM | POA: Diagnosis not present

## 2015-05-21 DIAGNOSIS — Z992 Dependence on renal dialysis: Secondary | ICD-10-CM | POA: Diagnosis not present

## 2015-05-21 DIAGNOSIS — N2581 Secondary hyperparathyroidism of renal origin: Secondary | ICD-10-CM | POA: Diagnosis not present

## 2015-05-21 DIAGNOSIS — N186 End stage renal disease: Secondary | ICD-10-CM | POA: Diagnosis not present

## 2015-05-21 DIAGNOSIS — E611 Iron deficiency: Secondary | ICD-10-CM | POA: Diagnosis not present

## 2015-05-23 DIAGNOSIS — N2581 Secondary hyperparathyroidism of renal origin: Secondary | ICD-10-CM | POA: Diagnosis not present

## 2015-05-23 DIAGNOSIS — E611 Iron deficiency: Secondary | ICD-10-CM | POA: Diagnosis not present

## 2015-05-23 DIAGNOSIS — Z992 Dependence on renal dialysis: Secondary | ICD-10-CM | POA: Diagnosis not present

## 2015-05-23 DIAGNOSIS — N186 End stage renal disease: Secondary | ICD-10-CM | POA: Diagnosis not present

## 2015-05-26 DIAGNOSIS — N2581 Secondary hyperparathyroidism of renal origin: Secondary | ICD-10-CM | POA: Diagnosis not present

## 2015-05-26 DIAGNOSIS — E611 Iron deficiency: Secondary | ICD-10-CM | POA: Diagnosis not present

## 2015-05-26 DIAGNOSIS — Z992 Dependence on renal dialysis: Secondary | ICD-10-CM | POA: Diagnosis not present

## 2015-05-26 DIAGNOSIS — N186 End stage renal disease: Secondary | ICD-10-CM | POA: Diagnosis not present

## 2015-05-28 DIAGNOSIS — Z992 Dependence on renal dialysis: Secondary | ICD-10-CM | POA: Diagnosis not present

## 2015-05-28 DIAGNOSIS — N186 End stage renal disease: Secondary | ICD-10-CM | POA: Diagnosis not present

## 2015-05-28 DIAGNOSIS — N2581 Secondary hyperparathyroidism of renal origin: Secondary | ICD-10-CM | POA: Diagnosis not present

## 2015-05-28 DIAGNOSIS — E611 Iron deficiency: Secondary | ICD-10-CM | POA: Diagnosis not present

## 2015-05-30 DIAGNOSIS — N2581 Secondary hyperparathyroidism of renal origin: Secondary | ICD-10-CM | POA: Diagnosis not present

## 2015-05-30 DIAGNOSIS — N186 End stage renal disease: Secondary | ICD-10-CM | POA: Diagnosis not present

## 2015-05-30 DIAGNOSIS — Z992 Dependence on renal dialysis: Secondary | ICD-10-CM | POA: Diagnosis not present

## 2015-05-30 DIAGNOSIS — E611 Iron deficiency: Secondary | ICD-10-CM | POA: Diagnosis not present

## 2015-06-02 DIAGNOSIS — Z992 Dependence on renal dialysis: Secondary | ICD-10-CM | POA: Diagnosis not present

## 2015-06-02 DIAGNOSIS — N2581 Secondary hyperparathyroidism of renal origin: Secondary | ICD-10-CM | POA: Diagnosis not present

## 2015-06-02 DIAGNOSIS — N186 End stage renal disease: Secondary | ICD-10-CM | POA: Diagnosis not present

## 2015-06-02 DIAGNOSIS — E611 Iron deficiency: Secondary | ICD-10-CM | POA: Diagnosis not present

## 2015-06-04 DIAGNOSIS — N186 End stage renal disease: Secondary | ICD-10-CM | POA: Diagnosis not present

## 2015-06-04 DIAGNOSIS — E611 Iron deficiency: Secondary | ICD-10-CM | POA: Diagnosis not present

## 2015-06-04 DIAGNOSIS — N2581 Secondary hyperparathyroidism of renal origin: Secondary | ICD-10-CM | POA: Diagnosis not present

## 2015-06-04 DIAGNOSIS — Z992 Dependence on renal dialysis: Secondary | ICD-10-CM | POA: Diagnosis not present

## 2015-06-08 ENCOUNTER — Ambulatory Visit
Admission: RE | Admit: 2015-06-08 | Discharge: 2015-06-08 | Disposition: A | Discharge status: Home / Self Care | Payer: Medicare Other | Source: Ambulatory Visit | Attending: Vascular Surgery | Admitting: Vascular Surgery

## 2015-06-08 ENCOUNTER — Encounter
Admission: RE | Disposition: A | Discharge status: Home / Self Care | Payer: Self-pay | Source: Ambulatory Visit | Attending: Vascular Surgery

## 2015-06-08 ENCOUNTER — Encounter: Payer: Self-pay | Admitting: *Deleted

## 2015-06-08 ENCOUNTER — Other Ambulatory Visit: Payer: Self-pay | Admitting: Vascular Surgery

## 2015-06-08 DIAGNOSIS — E663 Overweight: Secondary | ICD-10-CM | POA: Diagnosis not present

## 2015-06-08 DIAGNOSIS — Z992 Dependence on renal dialysis: Secondary | ICD-10-CM | POA: Insufficient documentation

## 2015-06-08 DIAGNOSIS — T82858A Stenosis of vascular prosthetic devices, implants and grafts, initial encounter: Secondary | ICD-10-CM | POA: Diagnosis not present

## 2015-06-08 DIAGNOSIS — N186 End stage renal disease: Secondary | ICD-10-CM | POA: Diagnosis not present

## 2015-06-08 DIAGNOSIS — Z9889 Other specified postprocedural states: Secondary | ICD-10-CM | POA: Insufficient documentation

## 2015-06-08 DIAGNOSIS — Y841 Kidney dialysis as the cause of abnormal reaction of the patient, or of later complication, without mention of misadventure at the time of the procedure: Secondary | ICD-10-CM | POA: Diagnosis not present

## 2015-06-08 DIAGNOSIS — T82868A Thrombosis of vascular prosthetic devices, implants and grafts, initial encounter: Secondary | ICD-10-CM | POA: Diagnosis not present

## 2015-06-08 DIAGNOSIS — I132 Hypertensive heart and chronic kidney disease with heart failure and with stage 5 chronic kidney disease, or end stage renal disease: Secondary | ICD-10-CM | POA: Diagnosis not present

## 2015-06-08 DIAGNOSIS — Z87891 Personal history of nicotine dependence: Secondary | ICD-10-CM | POA: Insufficient documentation

## 2015-06-08 DIAGNOSIS — T82318A Breakdown (mechanical) of other vascular grafts, initial encounter: Secondary | ICD-10-CM | POA: Diagnosis not present

## 2015-06-08 DIAGNOSIS — Y832 Surgical operation with anastomosis, bypass or graft as the cause of abnormal reaction of the patient, or of later complication, without mention of misadventure at the time of the procedure: Secondary | ICD-10-CM | POA: Insufficient documentation

## 2015-06-08 HISTORY — PX: PERIPHERAL VASCULAR CATHETERIZATION: SHX172C

## 2015-06-08 LAB — POTASSIUM (ARMC VASCULAR LAB ONLY): Potassium (ARMC vascular lab): 4.3 (ref 3.5–5.1)

## 2015-06-08 SURGERY — A/V SHUNTOGRAM/FISTULAGRAM
Anesthesia: Moderate Sedation

## 2015-06-08 MED ORDER — FENTANYL CITRATE (PF) 100 MCG/2ML IJ SOLN
INTRAMUSCULAR | Status: DC | PRN
  Administered 2015-06-08 (×5): 50 ug via INTRAVENOUS

## 2015-06-08 MED ORDER — HEPARIN SODIUM (PORCINE) 1000 UNIT/ML IJ SOLN
INTRAMUSCULAR | Status: AC
  Filled 2015-06-08: qty 1

## 2015-06-08 MED ORDER — MIDAZOLAM HCL 2 MG/2ML IJ SOLN
INTRAMUSCULAR | Status: AC
  Filled 2015-06-08: qty 2

## 2015-06-08 MED ORDER — MIDAZOLAM HCL 5 MG/5ML IJ SOLN
INTRAMUSCULAR | Status: AC
  Filled 2015-06-08: qty 5

## 2015-06-08 MED ORDER — IOHEXOL 300 MG/ML  SOLN
INTRAMUSCULAR | Status: DC | PRN
  Administered 2015-06-08: 40 mL via INTRAVENOUS

## 2015-06-08 MED ORDER — FENTANYL CITRATE (PF) 100 MCG/2ML IJ SOLN
INTRAMUSCULAR | Status: AC
  Filled 2015-06-08: qty 2

## 2015-06-08 MED ORDER — HEPARIN SODIUM (PORCINE) 1000 UNIT/ML IJ SOLN
INTRAMUSCULAR | Status: DC | PRN
  Administered 2015-06-08: 4000 [IU] via INTRAVENOUS

## 2015-06-08 MED ORDER — DEXTROSE 5 % IV SOLN: 1.5000 g | INTRAVENOUS | Status: DC

## 2015-06-08 MED ORDER — HYDROMORPHONE HCL 1 MG/ML IJ SOLN: 1.0000 mg | Freq: Once | INTRAMUSCULAR | Status: DC

## 2015-06-08 MED ORDER — SODIUM CHLORIDE 0.9 % IV SOLN
INTRAVENOUS | Status: DC
  Administered 2015-06-08: 11:00:00 via INTRAVENOUS

## 2015-06-08 MED ORDER — HEPARIN (PORCINE) IN NACL 2-0.9 UNIT/ML-% IJ SOLN
INTRAMUSCULAR | Status: AC
  Filled 2015-06-08: qty 1500

## 2015-06-08 MED ORDER — ONDANSETRON HCL 4 MG/2ML IJ SOLN: 4.0000 mg | Freq: Four times a day (QID) | INTRAMUSCULAR | Status: DC | PRN

## 2015-06-08 MED ORDER — ALTEPLASE 2 MG IJ SOLR
INTRAMUSCULAR | Status: AC
  Filled 2015-06-08: qty 4

## 2015-06-08 MED ORDER — LIDOCAINE-EPINEPHRINE (PF) 1 %-1:200000 IJ SOLN
INTRAMUSCULAR | Status: AC
  Filled 2015-06-08: qty 30

## 2015-06-08 MED ORDER — MIDAZOLAM HCL 2 MG/2ML IJ SOLN
INTRAMUSCULAR | Status: DC | PRN
  Administered 2015-06-08 (×3): 1 mg via INTRAVENOUS
  Administered 2015-06-08: 2 mg via INTRAVENOUS
  Administered 2015-06-08: 1 mg via INTRAVENOUS

## 2015-06-08 SURGICAL SUPPLY — 15 items
BALLN DORADO 7X60X80 (BALLOONS) ×2
BALLN DORADO 8X100X80 (BALLOONS) ×2
BALLOON DORADO 7X60X80 (BALLOONS) ×1 IMPLANT
BALLOON DORADO 8X100X80 (BALLOONS) ×1 IMPLANT
CANISTER PENUMBRA MAX (MISCELLANEOUS) ×2 IMPLANT
CANNULA 5F STIFF (CANNULA) ×2 IMPLANT
CATH INDIGO 6 ST-TIP 135CM (CATHETERS) ×4 IMPLANT
CATH TORCON 5FR 0.38 (CATHETERS) ×2 IMPLANT
DEVICE PRESTO INFLATION (MISCELLANEOUS) ×2 IMPLANT
DRAPE BRACHIAL (DRAPES) ×2 IMPLANT
PACK ANGIOGRAPHY (CUSTOM PROCEDURE TRAY) ×2 IMPLANT
SHEATH BRITE TIP 6FRX5.5 (SHEATH) ×4 IMPLANT
TOWEL OR 17X26 4PK STRL BLUE (TOWEL DISPOSABLE) ×2 IMPLANT
TUBING ASPIRATION INDIGO (MISCELLANEOUS) ×2 IMPLANT
WIRE MAGIC TORQUE 260C (WIRE) ×4 IMPLANT

## 2015-06-08 NOTE — Op Note (Signed)
[facility]    OPERATIVE NOTE   PROCEDURE: 1.  Right arm HeRO graft cannulation under ultrasound guidance in both a retrograde and then antegrade fashion crossing 2.  Right arm shuntogram and central venogram 3.  Catheter directed thrombolysis with 4 mg of TPA delivered to the graft 4.  Mechanical thrombectomy to the HeRO graft with the Penumbra CAT 6 catheter 5.  Percutaneous transluminal angioplasty of arterial anastomosis with 7 mm diameter high pressure angioplasty balloon 6.  Percutaneous transluminal angioplasty of the mid and distal graft with 38m diameter high pressure angioplasty balloon  PRE-OPERATIVE DIAGNOSIS: 1. ESRD 2.  Thrombosed right arm HeRO graft  POST-OPERATIVE DIAGNOSIS: same as above   SURGEON: JLeotis Pain MD  ANESTHESIA: local with Moderate Conscious Sedation   ESTIMATED BLOOD LOSS: 50 cc  FINDING(S): 1. Thrombosed HeRO graft, opened with intervention  SPECIMEN(S):  None  CONTRAST: 40 cc  FLUORO TIME: 8.9 minutes  MODERATE CONSCIOUS SEDATION TIME: Approximately 60 minutes with 6 mg of Versed and 250 mcg of Fentanyl  INDICATIONS: Patient is a 50y.o.male who presents with a thrombosed right arm HeRO graft.  The patient is scheduled for an attempted declot and shuntogram.  The patient is aware the risks include but are not limited to: bleeding, infection, thrombosis of the cannulated access, and possible anaphylactic reaction to the contrast.  The patient is aware of the risks of the procedure and elects to proceed forward.  DESCRIPTION: After full informed written consent was obtained, the patient was brought back to the angiography suite and placed supine upon the angiography table.  The patient was connected to monitoring equipment.  Moderate conscious sedation was administered during a face to face encounter with the patient throughout the procedure with my supervision of the RN administering medicines and monitoring the  patient's vital signs and mental status throughout from the start of the procedure until the patient was taken to the recovery room.  The  right arm was prepped and draped in the standard fashion for a percutaneous access intervention.  Under ultrasound guidance, the right arm HeRO graft was cannulated with a micropuncture needle under direct ultrasound guidance due to the pulseless nature of the graft in both an antegrade and a retrograde fashion crossing, and permanent images were performed.  The microwire was advanced and the needle was exchanged for the a microsheath.  I then upsized to a 6 Fr Sheath and imaging was performed.  Hand injections were completed to image the access including the central venous system. This demonstrated no flow within the HeRO graft.  Based on the images, this patient will need extensive treatment to salvage the graft. I then gave the patient 4000 units of intravenous heparin.  I then placed a Magic torque wire into the brachial artery from the retrograde sheath and into the right atrium from the antegrade sheath. 4 mg of TPA were deployed throughout the entirety of the PTFE portion of the graft. This was allowed to dwell for 10-15 minutes. Mechanical thrombectomy was then performed throughout the PTFE portion of the HeRO graft and into the brachial artery at the anastomosis using the CAT 6 Penumbra device. This uncovered a residual thrombus and stenosis in the mid graft and a residual arterial plug was also seen at the arterial anastomosis. An attempt to clear the arterial plug was done with two passes of the CAT 6 device. Flow-limiting arterial plug remained, and I elected to treat this lesion with a 7 mm diameter  by 6 cm length high pressure angioplasty balloon. This was inflated to 20 atm for 1 minute. This resulted in resolution of the arterial plug, and clearance of the arterial side of the graft. The arterial outflow was seen to be intact distal to the graft on these images.  The retrograde sheath was removed as a 4-0 Monocryl pursestring suture was placed. I then turned my attention to the thrombus in the distal graft. Mechanical thrombectomy was performed again using the penumbra device. This resulted in improvement but residual thrombus and stenosis in the mid to distal PTFE portion of the graft.  I then elected to treat this with an 8 mm diameter by 10 cm length high pressure angioplasty balloon this was inflated to 16 atm for 1 minute. Completion angiogram following angioplasty showed a widely patent graft with no significant residual thrombus.   Based on the completion imaging, no further intervention is necessary.  The wire and balloon were removed from the sheath.  A 4-0 Monocryl purse-string suture was sewn around the sheath.  The sheath was removed while tying down the suture.  A sterile bandage was applied to the puncture site.  COMPLICATIONS: None  CONDITION: Stable   DEW,JASON 06/08/2015 4:56 PM

## 2015-06-08 NOTE — H&P (Signed)
[facility] Admission History & Physical  MRN : [id number]  [name] [name] is a 50 y.o. (1965/09/23) male who presents with chief complaint of No chief complaint on file. [name]  History of Present Illness:  Patient was in his usual state of health when he presented to dialysis today and his graft was found to be thrombosed. He was sent over from the dialysis center to try to salvage his graft. He has no other complaints.  Current Facility-Administered Medications  Medication Dose Route Frequency Provider Last Rate Last Dose  . 0.9 %  sodium chloride infusion   Intravenous Continuous Kimberly A Stegmayer, PA-C 10 mL/hr at 06/08/15 1046    . cefUROXime (ZINACEF) 1.5 g in dextrose 5 % 50 mL IVPB  1.5 g Intravenous 30 min Pre-Op Janalyn Harder Stegmayer, PA-C      . HYDROmorphone (DILAUDID) injection 1 mg  1 mg Intravenous Once [employer], PA-C      . ondansetron (ZOFRAN) injection 4 mg  4 mg Intravenous Q6H PRN Sela Hua, PA-C        Past Medical History  Diagnosis Date  . Hypertension   . Orthostatic hypotension      2008  . Chest discomfort      2008  . Sinus tachycardia ([place])      2008, TSH normal  . Ejection fraction      65%, echo, 2008   /   EF 55-60%, echo, October 28, 2010  . Overweight(278.02)   . Syncope      positional after dialysis... 2008  . Aneurysm ([place])      Right arm fistula 3 aneurysms 2011,   plans to have a new procedure  . CHF (congestive heart failure) ([place])   . Leg pain   . Angina     occasional, last 6 mo ago  . Dialysis patient [facility])     M-W-F @ [place]  . ESRD (end stage renal disease) ([place])     on hemodialysis M-W-F [place]    Past Surgical History  Procedure Laterality Date  . Right arm graft      for dyalisis  . Umbilical hernia repair    . Thrombectomy    . Arteriovenous graft placement    . Av fistula placement    . Thrombectomy w/ embolectomy  03/01/2011    Procedure: THROMBECTOMY ARTERIOVENOUS  GORE-TEX GRAFT;  Surgeon: Elam Dutch, MD;  Location: [place];  Service: Vascular;  Laterality: Right;  Attempted Thrombectomy of Old  Right Upper Arm Arteriovenous gortex Graft. Insertion of new Arteriovenous Graft using 3mm x 50cm Gortex Stretch graft.   . Insertion of dialysis catheter  03/01/2011    Procedure: INSERTION OF DIALYSIS CATHETER;  Surgeon: Elam Dutch, MD;  Location: [place];  Service: Vascular;  Laterality: Left;  Exchange of Dialysis Catheter to 27cm 15Fr. Arrow Catheter  . Thrombectomy w/ embolectomy  07/11/2011    Procedure: THROMBECTOMY ARTERIOVENOUS GORE-TEX GRAFT;  Surgeon: Rosetta Posner, MD;  Location: [place];  Service: Vascular;  Laterality: Right;  [name] N/A 08/23/2011    Procedure: VENOGRAM;  Surgeon: [name], MD;  Location: [facility] CATH LAB;  Service: Cardiovascular;  Laterality: N/A;  . Peripheral vascular catheterization N/A 09/01/2014    Procedure: A/V Shuntogram/Fistulagram;  Surgeon: Algernon [place], MD;  Location: [place] CV LAB;  Service: Cardiovascular;  Laterality: N/A;  . Peripheral vascular catheterization Right 09/01/2014    Procedure: Thrombectomy;  Surgeon:  Algernon [place], MD;  Location: [place] CV LAB;  Service: Cardiovascular;  Laterality: Right;  . Peripheral vascular catheterization Right 09/01/2014    Procedure: A/V Shunt Intervention;  Surgeon: Algernon [place], MD;  Location: [place] CV LAB;  Service: Cardiovascular;  Laterality: Right;  . Peripheral vascular catheterization N/A 09/17/2014    Procedure: A/V Shuntogram/Fistulagram;  Surgeon: Algernon [place], MD;  Location: [place] CV LAB;  Service: Cardiovascular;  Laterality: N/A;  . Peripheral vascular catheterization Right 10/17/2014    Procedure: Thrombectomy;  Surgeon: Katha Cabal, MD;  Location: [place] CV LAB;  Service: Cardiovascular;  Laterality: Right;  . Peripheral vascular catheterization N/A 10/17/2014    Procedure: A/V Shuntogram/Fistulagram;  Surgeon: Katha Cabal, MD;  Location: [place] CV LAB;  Service: Cardiovascular;  Laterality: N/A;  . Peripheral vascular catheterization N/A 10/17/2014    Procedure: A/V Shunt Intervention;  Surgeon: Katha Cabal, MD;  Location: [place] CV LAB;  Service: Cardiovascular;  Laterality: N/A;  . Peripheral vascular catheterization Right 11/04/2014    Procedure: Thrombectomy;  Surgeon: Katha Cabal, MD;  Location: [place] CV LAB;  Service: Cardiovascular;  Laterality: Right;  . Peripheral vascular catheterization Left 11/04/2014    Procedure: Visceral Venography;  Surgeon: Katha Cabal, MD;  Location: [place] CV LAB;  Service: Cardiovascular;  Laterality: Left;  . Peripheral vascular catheterization Right 11/27/2014    Procedure: A/V Shuntogram/Fistulagram;  Surgeon: Algernon [place], MD;  Location: [place] CV LAB;  Service: Cardiovascular;  Laterality: Right;  . Peripheral vascular catheterization Right 11/27/2014    Procedure: A/V Shunt Intervention;  Surgeon: Algernon [place], MD;  Location: [place] CV LAB;  Service: Cardiovascular;  Laterality: Right;  . Peripheral vascular catheterization Right 12/31/2014    Procedure: Thrombectomy;  Surgeon: Algernon [place], MD;  Location: [place] CV LAB;  Service: Cardiovascular;  Laterality: Right;  . Peripheral vascular catheterization Right 01/12/2015    Procedure: A/V Shuntogram/Fistulagram;  Surgeon: Algernon [place], MD;  Location: [place] CV LAB;  Service: Cardiovascular;  Laterality: Right;  . Peripheral vascular catheterization N/A 01/12/2015    Procedure: A/V Shunt Intervention;  Surgeon: Algernon [place], MD;  Location: [place] CV LAB;  Service: Cardiovascular;  Laterality: N/A;  . Peripheral vascular catheterization Right 01/28/2015    Procedure: Thrombectomy;  Surgeon: Algernon [place], MD;  Location: [place] CV LAB;  Service: Cardiovascular;  Laterality: Right;    Social History Social History  Substance Use Topics  .  Smoking status: Former Smoker    Types: Cigarettes    Quit date: 04/18/1994  . Smokeless tobacco: Current User    Types: Snuff     Comment: dips 1/2 snuff per day times 25 years  . Alcohol Use: No   no IV drug use  Family History Family History  Problem Relation Age of Onset  . Hypertension Mother   . Heart disease Mother   . Hypertension Father   . Heart disease Father    no bleeding or clotting disorders  No Known Allergies   REVIEW OF SYSTEMS (Negative unless checked)  Constitutional: [] Weight loss  [] Fever  [] Chills Cardiac: [x] Chest pain   [] Chest pressure   [] Palpitations   [] Shortness of breath when laying flat   [] Shortness of breath at rest   [] Shortness of breath with exertion. Vascular:  [] Pain in legs with walking   [] Pain in legs at rest   [] Pain in legs when laying flat   []   Claudication   [] Pain in feet when walking  [] Pain in feet at rest  [] Pain in feet when laying flat   [] History of DVT   [] Phlebitis   [] Swelling in legs   [] Varicose veins   [] Non-healing ulcers Pulmonary:   [] Uses home oxygen   [] Productive cough   [] Hemoptysis   [] Wheeze  [] COPD   [] Asthma Neurologic:  [] Dizziness  [] Blackouts   [] Seizures   [] History of stroke   [] History of TIA  [] Aphasia   [] Temporary blindness   [] Dysphagia   [] Weakness or numbness in arms   [] Weakness or numbness in legs Musculoskeletal:  [] Arthritis   [] Joint swelling   [] Joint pain   [] Low back pain Hematologic:  [] Easy bruising  [] Easy bleeding   [] Hypercoagulable state   [] Anemic  [] Hepatitis Gastrointestinal:  [] Blood in stool   [] Vomiting blood  [] Gastroesophageal reflux/heartburn   [] Difficulty swallowing. Genitourinary:  [x] Chronic kidney disease   [] Difficult urination  [] Frequent urination  [] Burning with urination   [] Blood in urine Skin:  [] Rashes   [] Ulcers   [] Wounds Psychological:  [] History of anxiety   []  History of major depression.  Physical Examination  Filed Vitals:   06/08/15 1030  BP: 131/85   Pulse: 87  Height: 6' (1.829 m)  Weight: 156.491 kg (345 lb)  SpO2: 96%   Body mass index is 46.78 kg/(m^2). Gen: WD/WN, NAD Head: [place]/AT, No temporalis wasting. Prominent temp pulse not noted. Ear/Nose/Throat: Hearing grossly intact, nares w/o erythema or drainage, oropharynx w/o Erythema/Exudate,  Eyes: PERRLA, EOMI.  Neck: Supple, no nuchal rigidity.  No bruit or JVD.  Pulmonary:  Good air movement, clear to auscultation bilaterally, no use of accessory muscles.  Cardiac: RRR, normal S1, S2, no Murmurs, rubs or gallops. Vascular: No pulse or thrill present in the graft in the right arm Vessel Right Left  Radial Palpable Palpable                                   Gastrointestinal: soft, non-tender/non-distended. No guarding/reflex.  Musculoskeletal: M/S 5/5 throughout.  Extremities without ischemic changes.  No deformity or atrophy.  Neurologic: CN 2-12 intact. Pain and light touch intact in extremities.  Symmetrical.  Speech is fluent. Motor exam as listed above. Psychiatric: Judgment intact, Mood & affect appropriate for pt's clinical situation. Dermatologic: No rashes or ulcers noted.  No cellulitis or open wounds. Lymph : No Cervical, Axillary, or Inguinal lymphadenopathy.     CBC Lab Results  Component Value Date   WBC 7.5 11/13/2013   HGB 11.3* 11/13/2013   HCT 36.2* 11/13/2013   MCV 77* 11/13/2013   PLT 211 11/13/2013    BMET    Component Value Date/Time   NA 137 11/13/2013 0408   NA 136 04/06/2012 1426   K 4.1 11/13/2013 0408   K 5.0 08/08/2012 1430   CL 98 11/13/2013 0408   CL 92* 04/06/2012 1426   CO2 25 11/13/2013 0408   CO2 22 04/06/2012 1426   GLUCOSE 64* 11/13/2013 0408   GLUCOSE 87 04/06/2012 1426   BUN 68* 11/13/2013 0408   BUN 78* 04/06/2012 1426   CREATININE 16.45* 11/13/2013 0408   CREATININE 17.82* 04/06/2012 1426   CALCIUM 6.8* 11/13/2013 0408   CALCIUM 8.6 04/06/2012 1426   GFRNONAA 3* 11/13/2013 0408   GFRNONAA 3* 04/06/2012  1426   GFRAA 3* 11/13/2013 0408   GFRAA 3* 04/06/2012 1426   CrCl cannot be calculated (  Patient has no serum creatinine result on file.).  COAG Lab Results  Component Value Date   INR 1.05 08/08/2012   INR 0.98 06/01/2011   INR 1.04 03/01/2011    Radiology No results found.    Assessment/Plan 1. Clotted right arm hero graft. Will attempt salvage today. 2. ESRD. Has been on dialysis now for many years. Difficult access situation.   DEW,JASON, MD  06/08/2015 3:49 PM

## 2015-06-09 ENCOUNTER — Encounter: Payer: Self-pay | Admitting: Vascular Surgery

## 2015-06-09 DIAGNOSIS — E611 Iron deficiency: Secondary | ICD-10-CM | POA: Diagnosis not present

## 2015-06-09 DIAGNOSIS — N186 End stage renal disease: Secondary | ICD-10-CM | POA: Diagnosis not present

## 2015-06-09 DIAGNOSIS — Z992 Dependence on renal dialysis: Secondary | ICD-10-CM | POA: Diagnosis not present

## 2015-06-09 DIAGNOSIS — N2581 Secondary hyperparathyroidism of renal origin: Secondary | ICD-10-CM | POA: Diagnosis not present

## 2015-06-11 DIAGNOSIS — N186 End stage renal disease: Secondary | ICD-10-CM | POA: Diagnosis not present

## 2015-06-11 DIAGNOSIS — N2581 Secondary hyperparathyroidism of renal origin: Secondary | ICD-10-CM | POA: Diagnosis not present

## 2015-06-11 DIAGNOSIS — Z992 Dependence on renal dialysis: Secondary | ICD-10-CM | POA: Diagnosis not present

## 2015-06-11 DIAGNOSIS — E611 Iron deficiency: Secondary | ICD-10-CM | POA: Diagnosis not present

## 2015-06-13 DIAGNOSIS — Z992 Dependence on renal dialysis: Secondary | ICD-10-CM | POA: Diagnosis not present

## 2015-06-13 DIAGNOSIS — E611 Iron deficiency: Secondary | ICD-10-CM | POA: Diagnosis not present

## 2015-06-13 DIAGNOSIS — N186 End stage renal disease: Secondary | ICD-10-CM | POA: Diagnosis not present

## 2015-06-13 DIAGNOSIS — N2581 Secondary hyperparathyroidism of renal origin: Secondary | ICD-10-CM | POA: Diagnosis not present

## 2015-06-16 DIAGNOSIS — Z992 Dependence on renal dialysis: Secondary | ICD-10-CM | POA: Diagnosis not present

## 2015-06-16 DIAGNOSIS — N2581 Secondary hyperparathyroidism of renal origin: Secondary | ICD-10-CM | POA: Diagnosis not present

## 2015-06-16 DIAGNOSIS — E611 Iron deficiency: Secondary | ICD-10-CM | POA: Diagnosis not present

## 2015-06-16 DIAGNOSIS — N186 End stage renal disease: Secondary | ICD-10-CM | POA: Diagnosis not present

## 2015-06-18 DIAGNOSIS — E611 Iron deficiency: Secondary | ICD-10-CM | POA: Diagnosis not present

## 2015-06-18 DIAGNOSIS — N186 End stage renal disease: Secondary | ICD-10-CM | POA: Diagnosis not present

## 2015-06-18 DIAGNOSIS — Z992 Dependence on renal dialysis: Secondary | ICD-10-CM | POA: Diagnosis not present

## 2015-06-18 DIAGNOSIS — N2581 Secondary hyperparathyroidism of renal origin: Secondary | ICD-10-CM | POA: Diagnosis not present

## 2015-06-20 DIAGNOSIS — E611 Iron deficiency: Secondary | ICD-10-CM | POA: Diagnosis not present

## 2015-06-20 DIAGNOSIS — N186 End stage renal disease: Secondary | ICD-10-CM | POA: Diagnosis not present

## 2015-06-20 DIAGNOSIS — N2581 Secondary hyperparathyroidism of renal origin: Secondary | ICD-10-CM | POA: Diagnosis not present

## 2015-06-20 DIAGNOSIS — Z992 Dependence on renal dialysis: Secondary | ICD-10-CM | POA: Diagnosis not present

## 2015-06-23 DIAGNOSIS — E611 Iron deficiency: Secondary | ICD-10-CM | POA: Diagnosis not present

## 2015-06-23 DIAGNOSIS — N186 End stage renal disease: Secondary | ICD-10-CM | POA: Diagnosis not present

## 2015-06-23 DIAGNOSIS — Z992 Dependence on renal dialysis: Secondary | ICD-10-CM | POA: Diagnosis not present

## 2015-06-23 DIAGNOSIS — N2581 Secondary hyperparathyroidism of renal origin: Secondary | ICD-10-CM | POA: Diagnosis not present

## 2015-06-25 DIAGNOSIS — Z992 Dependence on renal dialysis: Secondary | ICD-10-CM | POA: Diagnosis not present

## 2015-06-25 DIAGNOSIS — N2581 Secondary hyperparathyroidism of renal origin: Secondary | ICD-10-CM | POA: Diagnosis not present

## 2015-06-25 DIAGNOSIS — N186 End stage renal disease: Secondary | ICD-10-CM | POA: Diagnosis not present

## 2015-06-25 DIAGNOSIS — E611 Iron deficiency: Secondary | ICD-10-CM | POA: Diagnosis not present

## 2015-06-27 DIAGNOSIS — E611 Iron deficiency: Secondary | ICD-10-CM | POA: Diagnosis not present

## 2015-06-27 DIAGNOSIS — N2581 Secondary hyperparathyroidism of renal origin: Secondary | ICD-10-CM | POA: Diagnosis not present

## 2015-06-27 DIAGNOSIS — Z992 Dependence on renal dialysis: Secondary | ICD-10-CM | POA: Diagnosis not present

## 2015-06-27 DIAGNOSIS — N186 End stage renal disease: Secondary | ICD-10-CM | POA: Diagnosis not present

## 2015-06-30 DIAGNOSIS — N2581 Secondary hyperparathyroidism of renal origin: Secondary | ICD-10-CM | POA: Diagnosis not present

## 2015-06-30 DIAGNOSIS — N186 End stage renal disease: Secondary | ICD-10-CM | POA: Diagnosis not present

## 2015-06-30 DIAGNOSIS — Z992 Dependence on renal dialysis: Secondary | ICD-10-CM | POA: Diagnosis not present

## 2015-06-30 DIAGNOSIS — E611 Iron deficiency: Secondary | ICD-10-CM | POA: Diagnosis not present

## 2015-07-02 DIAGNOSIS — Z992 Dependence on renal dialysis: Secondary | ICD-10-CM | POA: Diagnosis not present

## 2015-07-02 DIAGNOSIS — N186 End stage renal disease: Secondary | ICD-10-CM | POA: Diagnosis not present

## 2015-07-02 DIAGNOSIS — N2581 Secondary hyperparathyroidism of renal origin: Secondary | ICD-10-CM | POA: Diagnosis not present

## 2015-07-02 DIAGNOSIS — E611 Iron deficiency: Secondary | ICD-10-CM | POA: Diagnosis not present

## 2015-07-06 ENCOUNTER — Other Ambulatory Visit: Payer: Self-pay | Admitting: Vascular Surgery

## 2015-07-06 ENCOUNTER — Encounter
Admission: RE | Disposition: A | Discharge status: Home / Self Care | Payer: Self-pay | Source: Ambulatory Visit | Attending: Vascular Surgery

## 2015-07-06 ENCOUNTER — Ambulatory Visit
Admission: RE | Admit: 2015-07-06 | Discharge: 2015-07-06 | Disposition: A | Discharge status: Home / Self Care | Payer: Medicare Other | Source: Ambulatory Visit | Attending: Vascular Surgery | Admitting: Vascular Surgery

## 2015-07-06 DIAGNOSIS — Z992 Dependence on renal dialysis: Secondary | ICD-10-CM | POA: Insufficient documentation

## 2015-07-06 DIAGNOSIS — F17299 Nicotine dependence, other tobacco product, with unspecified nicotine-induced disorders: Secondary | ICD-10-CM | POA: Insufficient documentation

## 2015-07-06 DIAGNOSIS — I132 Hypertensive heart and chronic kidney disease with heart failure and with stage 5 chronic kidney disease, or end stage renal disease: Secondary | ICD-10-CM | POA: Diagnosis not present

## 2015-07-06 DIAGNOSIS — I509 Heart failure, unspecified: Secondary | ICD-10-CM | POA: Diagnosis not present

## 2015-07-06 DIAGNOSIS — T82318A Breakdown (mechanical) of other vascular grafts, initial encounter: Secondary | ICD-10-CM | POA: Diagnosis not present

## 2015-07-06 DIAGNOSIS — N186 End stage renal disease: Secondary | ICD-10-CM | POA: Diagnosis not present

## 2015-07-06 DIAGNOSIS — E663 Overweight: Secondary | ICD-10-CM | POA: Insufficient documentation

## 2015-07-06 DIAGNOSIS — T82868A Thrombosis of vascular prosthetic devices, implants and grafts, initial encounter: Secondary | ICD-10-CM | POA: Diagnosis not present

## 2015-07-06 DIAGNOSIS — Y841 Kidney dialysis as the cause of abnormal reaction of the patient, or of later complication, without mention of misadventure at the time of the procedure: Secondary | ICD-10-CM | POA: Insufficient documentation

## 2015-07-06 DIAGNOSIS — Z8249 Family history of ischemic heart disease and other diseases of the circulatory system: Secondary | ICD-10-CM | POA: Diagnosis not present

## 2015-07-06 DIAGNOSIS — T82858A Stenosis of vascular prosthetic devices, implants and grafts, initial encounter: Secondary | ICD-10-CM | POA: Diagnosis not present

## 2015-07-06 HISTORY — PX: PERIPHERAL VASCULAR CATHETERIZATION: SHX172C

## 2015-07-06 LAB — POTASSIUM (ARMC VASCULAR LAB ONLY): POTASSIUM (ARMC VASCULAR LAB): 4.8 (ref 3.5–5.1)

## 2015-07-06 SURGERY — A/V SHUNTOGRAM/FISTULAGRAM
Anesthesia: Moderate Sedation | Laterality: Right

## 2015-07-06 MED ORDER — FAMOTIDINE 20 MG PO TABS: 40.0000 mg | ORAL_TABLET | ORAL | Status: DC | PRN

## 2015-07-06 MED ORDER — METHYLPREDNISOLONE SODIUM SUCC 125 MG IJ SOLR: 125.0000 mg | INTRAMUSCULAR | Status: DC | PRN

## 2015-07-06 MED ORDER — ALTEPLASE 2 MG IJ SOLR
INTRAMUSCULAR | Status: DC | PRN
  Administered 2015-07-06: 4 mg

## 2015-07-06 MED ORDER — HYDROMORPHONE HCL 1 MG/ML IJ SOLN: 1.0000 mg | Freq: Once | INTRAMUSCULAR | Status: DC

## 2015-07-06 MED ORDER — FENTANYL CITRATE (PF) 100 MCG/2ML IJ SOLN
INTRAMUSCULAR | Status: DC | PRN
  Administered 2015-07-06: 50 ug via INTRAVENOUS

## 2015-07-06 MED ORDER — HEPARIN SODIUM (PORCINE) 1000 UNIT/ML IJ SOLN
INTRAMUSCULAR | Status: AC
  Filled 2015-07-06: qty 1

## 2015-07-06 MED ORDER — CEFUROXIME SODIUM 1.5 G IJ SOLR
INTRAMUSCULAR | Status: DC | PRN
  Administered 2015-07-06: 1.5 g via INTRAVENOUS

## 2015-07-06 MED ORDER — MIDAZOLAM HCL 5 MG/5ML IJ SOLN
INTRAMUSCULAR | Status: AC
Start: 2015-07-06 — End: 2015-07-06
  Filled 2015-07-06: qty 5

## 2015-07-06 MED ORDER — IOHEXOL 300 MG/ML  SOLN
INTRAMUSCULAR | Status: DC | PRN
  Administered 2015-07-06: 40 mL via INTRAVENOUS

## 2015-07-06 MED ORDER — ONDANSETRON HCL 4 MG/2ML IJ SOLN: 4.0000 mg | Freq: Four times a day (QID) | INTRAMUSCULAR | Status: DC | PRN

## 2015-07-06 MED ORDER — FENTANYL CITRATE (PF) 100 MCG/2ML IJ SOLN
INTRAMUSCULAR | Status: AC
Start: 2015-07-06 — End: 2015-07-06
  Filled 2015-07-06: qty 2

## 2015-07-06 MED ORDER — HEPARIN SODIUM (PORCINE) 1000 UNIT/ML IJ SOLN
INTRAMUSCULAR | Status: DC | PRN
  Administered 2015-07-06: 4000 [IU] via INTRAVENOUS

## 2015-07-06 MED ORDER — CEFUROXIME SODIUM 1.5 G IJ SOLR: 1.5000 g | INTRAMUSCULAR | Status: DC

## 2015-07-06 MED ORDER — LIDOCAINE-EPINEPHRINE (PF) 1 %-1:200000 IJ SOLN
INTRAMUSCULAR | Status: AC
  Filled 2015-07-06: qty 30

## 2015-07-06 MED ORDER — SODIUM CHLORIDE 0.9 % IV SOLN: INTRAVENOUS | Status: DC

## 2015-07-06 MED ORDER — MIDAZOLAM HCL 2 MG/2ML IJ SOLN
INTRAMUSCULAR | Status: DC | PRN
  Administered 2015-07-06: 2 mg via INTRAVENOUS

## 2015-07-06 MED ORDER — HEPARIN (PORCINE) IN NACL 2-0.9 UNIT/ML-% IJ SOLN
INTRAMUSCULAR | Status: AC
  Filled 2015-07-06: qty 1000

## 2015-07-06 SURGICAL SUPPLY — 14 items
BALLN DORADO 8X100X80 (BALLOONS) ×4
BALLN DORADO7X100X80 (BALLOONS) ×4
BALLOON DORADO 8X100X80 (BALLOONS) ×2 IMPLANT
BALLOON DORADO7X100X80 (BALLOONS) ×2 IMPLANT
CANNULA 5F STIFF (CANNULA) ×4 IMPLANT
CATH EMBOLECTOMY 5FR (BALLOONS) ×4 IMPLANT
CATH TORCON 5FR 0.38 (CATHETERS) ×4 IMPLANT
DEVICE PRESTO INFLATION (MISCELLANEOUS) ×4 IMPLANT
DRAPE BRACHIAL (DRAPES) ×4 IMPLANT
PACK ANGIOGRAPHY (CUSTOM PROCEDURE TRAY) ×4 IMPLANT
SET AVX THROMB ULT (MISCELLANEOUS) ×4 IMPLANT
SHEATH BRITE TIP 6FRX5.5 (SHEATH) ×8 IMPLANT
TOWEL OR 17X26 4PK STRL BLUE (TOWEL DISPOSABLE) ×4 IMPLANT
WIRE MAGIC TOR.035 180C (WIRE) ×8 IMPLANT

## 2015-07-06 NOTE — Op Note (Signed)
[facility]    OPERATIVE NOTE   PROCEDURE: 1.  Right arm HeRO graft cannulation under ultrasound guidance in both a retrograde and then antegrade fashion crossing 2.  Right arm shuntogram and central venogram 3.  Catheter directed thrombolysis with 4 mg of TPA delivered with the AngioJet AVX catheter 4.  Mechanical rheolytic thrombectomy to the HeRO graft with the AngioJet AVX catheter 5.  Fogarty embolectomy for residual arterial plug 6.  Percutaneous transluminal angioplasty of arterial anastomosis with 7 mm diameter angioplasty balloon 7.  Percutaneous transluminal angioplasty of the of the mid and distal portions of the PTFE portion of the hero graft with 7 and 8 mm diameter angioplasty balloons  PRE-OPERATIVE DIAGNOSIS: 1. ESRD 2.  Thrombosed right arm HeRO graft  POST-OPERATIVE DIAGNOSIS: same as above   SURGEON: Leotis Pain, MD  ANESTHESIA: local with Moderate Conscious Sedation   ESTIMATED BLOOD LOSS: 25 cc  FINDING(S): 1. Thrombosed HeRO graft, opened with intervention  SPECIMEN(S):  None  CONTRAST: 40 cc  FLUORO TIME: 6.3 minutes  MODERATE CONSCIOUS SEDATION TIME: Approximately 45 minutes with 2 mg of Versed and 100 mcg of Fentanyl  INDICATIONS: Patient is a 50 y.o.male who presents with a thrombosed right arm HeRO graft.  The patient is scheduled for an attempted declot and shuntogram.  The patient is aware the risks include but are not limited to: bleeding, infection, thrombosis of the cannulated access, and possible anaphylactic reaction to the contrast.  The patient is aware of the risks of the procedure and elects to proceed forward.  DESCRIPTION: After full informed written consent was obtained, the patient was brought back to the angiography suite and placed supine upon the angiography table.  The patient was connected to monitoring equipment.  Moderate conscious sedation was administered during a face to face encounter with the patient  throughout the procedure with my supervision of the RN administering medicines and monitoring the patient's vital signs and mental status throughout from the start of the procedure until the patient was taken to the recovery room.  The  right arm was prepped and draped in the standard fashion for a percutaneous access intervention.  Under ultrasound guidance, the right arm HeRO graft was cannulated with a micropuncture needle under direct ultrasound guidance due to the pulseless nature of the graft in both an antegrade and a retrograde fashion crossing, and permanent images were performed.  The microwire was advanced and the needle was exchanged for the a microsheath.  I then upsized to a 6 Fr Sheath and imaging was performed.  Hand injections were completed to image the access including the central venous system. This demonstrated no flow within the HeRO graft.  Based on the images, this patient will need extensive treatment to salvage the graft. I then gave the patient 4000 units of intravenous heparin.  I then placed a Magic torque wire into the brachial artery from the retrograde sheath and into the right atrium from the antegrade sheath. 4 mg of TPA were deployed throughout the entirety of the PTFE portion of the graft and into the brachial artery at the anastomosis. This was allowed to dwell for 10-15 minutes. Mechanical rheolytic thrombectomy was then performed throughout the PTFE portion of the HeRO graft and into the brachial artery at the anastomosis. This uncovered thrombus and stenosis in the midportion of the graft and a residual arterial plug was also seen at the arterial anastomosis. An attempt to clear the arterial plug was done with 2  passes of the Fogarty embolectomy balloon. Flow-limiting arterial plug remained, and I elected to treat this lesion with a 7 mm diameter by 8 cm length high pressure angioplasty balloon. This resulted in resolution of the arterial plug, and clearance of the arterial  side of the graft. The arterial outflow was seen to be intact distal to the graft on these images. The retrograde sheath was removed as a 4-0 Monocryl pursestring suture was placed. I then turned my attention to the thrombus in the distal graft. Mechanical rheolytic thrombectomy was performed again. This resulted in some improvement but not resolution of the thrombus in the mid and distal portions of the graft.  I then elected to treat this with the 7 mm diameter angioplasty balloon with suboptimal result. I then upsized to an 8 mm diameter by 10 cm length high pressure angioplasty balloon and performed 2 inflations to 14 atm in the mid to distal PTFE portions of the graft. This resulted in resolution of the thrombus and return of flow clinically within the graft.    Based on the completion imaging, no further intervention is necessary.  The wire and balloon were removed from the sheath.  A 4-0 Monocryl purse-string suture was sewn around the sheath.  The sheath was removed while tying down the suture.  A sterile bandage was applied to the puncture site.  COMPLICATIONS: None  CONDITION: Stable   Shaneese Tait 07/06/2015 5:24 PM

## 2015-07-06 NOTE — H&P (Signed)
[facility] Admission History & Physical  MRN : [id number]  [name] [name] is a 50 y.o. (01/13/1966) male who presents with chief complaint of No chief complaint on file. [name]  History of Present Illness: Patient is sent over from his dialysis access center for a clotted graft. This has been treated many times with successful thrombectomy, but he has not had dialysis now 3 days. He needs his access declotted today. He has no other complaints. He feels well. He has no signs of volume overload. He denies fevers or chills or signs of systemic infection.  Current Facility-Administered Medications  Medication Dose Route Frequency Provider Last Rate Last Dose  . 0.9 %  sodium chloride infusion   Intravenous Continuous Kimberly A Stegmayer, PA-C      . cefUROXime (ZINACEF) 1.5 g in dextrose 5 % 50 mL IVPB  1.5 g Intravenous 30 min Pre-Op Kimberly A Stegmayer, PA-C      . famotidine (PEPCID) tablet 40 mg  40 mg Oral PRN Janalyn Harder Stegmayer, PA-C      . HYDROmorphone (DILAUDID) injection 1 mg  1 mg Intravenous Once [employer], PA-C      . methylPREDNISolone sodium succinate (SOLU-MEDROL) 125 mg/2 mL injection 125 mg  125 mg Intravenous PRN Kimberly A Stegmayer, PA-C      . ondansetron (ZOFRAN) injection 4 mg  4 mg Intravenous Q6H PRN Sela Hua, PA-C        Past Medical History  Diagnosis Date  . Hypertension   . Orthostatic hypotension      2008  . Chest discomfort      2008  . Sinus tachycardia ([place])      2008, TSH normal  . Ejection fraction      65%, echo, 2008   /   EF 55-60%, echo, October 28, 2010  . Overweight(278.02)   . Syncope      positional after dialysis... 2008  . Aneurysm ([place])      Right arm fistula 3 aneurysms 2011,   plans to have a new procedure  . CHF (congestive heart failure) ([place])   . Leg pain   . Angina     occasional, last 6 mo ago  . Dialysis patient [facility])     M-W-F @ [place]  . ESRD (end stage renal disease) ([place])      on hemodialysis M-W-F [place]    Past Surgical History  Procedure Laterality Date  . Right arm graft      for dyalisis  . Umbilical hernia repair    . Thrombectomy    . Arteriovenous graft placement    . Av fistula placement    . Thrombectomy w/ embolectomy  03/01/2011    Procedure: THROMBECTOMY ARTERIOVENOUS GORE-TEX GRAFT;  Surgeon: Elam Dutch, MD;  Location: [place];  Service: Vascular;  Laterality: Right;  Attempted Thrombectomy of Old  Right Upper Arm Arteriovenous gortex Graft. Insertion of new Arteriovenous Graft using 76mm x 50cm Gortex Stretch graft.   . Insertion of dialysis catheter  03/01/2011    Procedure: INSERTION OF DIALYSIS CATHETER;  Surgeon: Elam Dutch, MD;  Location: [place];  Service: Vascular;  Laterality: Left;  Exchange of Dialysis Catheter to 27cm 15Fr. Arrow Catheter  . Thrombectomy w/ embolectomy  07/11/2011    Procedure: THROMBECTOMY ARTERIOVENOUS GORE-TEX GRAFT;  Surgeon: Rosetta Posner, MD;  Location: [place];  Service: Vascular;  Laterality: Right;  [name] N/A 08/23/2011    Procedure: VENOGRAM;  Surgeon: [name], MD;  Location: [facility] CATH LAB;  Service: Cardiovascular;  Laterality: N/A;  . Peripheral vascular catheterization N/A 09/01/2014    Procedure: A/V Shuntogram/Fistulagram;  Surgeon: Algernon Huxley, MD;  Location: [place] CV LAB;  Service: Cardiovascular;  Laterality: N/A;  . Peripheral vascular catheterization Right 09/01/2014    Procedure: Thrombectomy;  Surgeon: Algernon Huxley, MD;  Location: [place] CV LAB;  Service: Cardiovascular;  Laterality: Right;  . Peripheral vascular catheterization Right 09/01/2014    Procedure: A/V Shunt Intervention;  Surgeon: Algernon Huxley, MD;  Location: [place] CV LAB;  Service: Cardiovascular;  Laterality: Right;  . Peripheral vascular catheterization N/A 09/17/2014    Procedure: A/V Shuntogram/Fistulagram;  Surgeon: Algernon Huxley, MD;  Location: [place] CV LAB;  Service: Cardiovascular;   Laterality: N/A;  . Peripheral vascular catheterization Right 10/17/2014    Procedure: Thrombectomy;  Surgeon: Katha Cabal, MD;  Location: [place] CV LAB;  Service: Cardiovascular;  Laterality: Right;  . Peripheral vascular catheterization N/A 10/17/2014    Procedure: A/V Shuntogram/Fistulagram;  Surgeon: Katha Cabal, MD;  Location: [place] CV LAB;  Service: Cardiovascular;  Laterality: N/A;  . Peripheral vascular catheterization N/A 10/17/2014    Procedure: A/V Shunt Intervention;  Surgeon: Katha Cabal, MD;  Location: [place] CV LAB;  Service: Cardiovascular;  Laterality: N/A;  . Peripheral vascular catheterization Right 11/04/2014    Procedure: Thrombectomy;  Surgeon: Katha Cabal, MD;  Location: [place] CV LAB;  Service: Cardiovascular;  Laterality: Right;  . Peripheral vascular catheterization Left 11/04/2014    Procedure: Visceral Venography;  Surgeon: Katha Cabal, MD;  Location: [place] CV LAB;  Service: Cardiovascular;  Laterality: Left;  . Peripheral vascular catheterization Right 11/27/2014    Procedure: A/V Shuntogram/Fistulagram;  Surgeon: Algernon Huxley, MD;  Location: [place] CV LAB;  Service: Cardiovascular;  Laterality: Right;  . Peripheral vascular catheterization Right 11/27/2014    Procedure: A/V Shunt Intervention;  Surgeon: Algernon Huxley, MD;  Location: [place] CV LAB;  Service: Cardiovascular;  Laterality: Right;  . Peripheral vascular catheterization Right 12/31/2014    Procedure: Thrombectomy;  Surgeon: Algernon Huxley, MD;  Location: [place] CV LAB;  Service: Cardiovascular;  Laterality: Right;  . Peripheral vascular catheterization Right 01/12/2015    Procedure: A/V Shuntogram/Fistulagram;  Surgeon: Algernon Huxley, MD;  Location: [place] CV LAB;  Service: Cardiovascular;  Laterality: Right;  . Peripheral vascular catheterization N/A 01/12/2015    Procedure: A/V Shunt Intervention;  Surgeon: Algernon Huxley, MD;   Location: [place] CV LAB;  Service: Cardiovascular;  Laterality: N/A;  . Peripheral vascular catheterization Right 01/28/2015    Procedure: Thrombectomy;  Surgeon: Algernon Huxley, MD;  Location: [place] CV LAB;  Service: Cardiovascular;  Laterality: Right;  . Peripheral vascular catheterization N/A 06/08/2015    Procedure: A/V Shuntogram/Fistulagram;  Surgeon: Algernon Huxley, MD;  Location: [place] CV LAB;  Service: Cardiovascular;  Laterality: N/A;  . Peripheral vascular catheterization N/A 06/08/2015    Procedure: A/V Shunt Intervention;  Surgeon: Algernon Huxley, MD;  Location: [place] CV LAB;  Service: Cardiovascular;  Laterality: N/A;    Social History Social History  Substance Use Topics  . Smoking status: Former Smoker    Types: Cigarettes    Quit date: 04/18/1994  . Smokeless tobacco: Current User    Types: Snuff     Comment: dips 1/2 snuff per day times 25 years  . Alcohol  Use: No  No IV drug use   Family History Family History  Problem Relation Age of Onset  . Hypertension Mother   . Heart disease Mother   . Hypertension Father   . Heart disease Father   No bleeding or clotting disorders  No Known Allergies   REVIEW OF SYSTEMS (Negative unless checked)  Constitutional: [] Weight loss  [] Fever  [] Chills Cardiac: [] Chest pain   [] Chest pressure   [] Palpitations   [] Shortness of breath when laying flat   [] Shortness of breath at rest   [] Shortness of breath with exertion. Vascular:  [] Pain in legs with walking   [] Pain in legs at rest   [] Pain in legs when laying flat   [] Claudication   [] Pain in feet when walking  [] Pain in feet at rest  [] Pain in feet when laying flat   [] History of DVT   [] Phlebitis   [] Swelling in legs   [] Varicose veins   [] Non-healing ulcers Pulmonary:   [] Uses home oxygen   [] Productive cough   [] Hemoptysis   [] Wheeze  [] COPD   [] Asthma Neurologic:  [] Dizziness  [] Blackouts   [] Seizures   [] History of stroke   [] History of TIA   [] Aphasia   [] Temporary blindness   [] Dysphagia   [] Weakness or numbness in arms   [] Weakness or numbness in legs Musculoskeletal:  [] Arthritis   [] Joint swelling   [] Joint pain   [] Low back pain Hematologic:  [] Easy bruising  [] Easy bleeding   [] Hypercoagulable state   [] Anemic  [] Hepatitis Gastrointestinal:  [] Blood in stool   [] Vomiting blood  [] Gastroesophageal reflux/heartburn   [] Difficulty swallowing. Genitourinary:  [x] Chronic kidney disease   [] Difficult urination  [] Frequent urination  [] Burning with urination   [] Blood in urine Skin:  [] Rashes   [] Ulcers   [] Wounds Psychological:  [] History of anxiety   []  History of major depression.  Physical Examination  There were no vitals filed for this visit. There is no weight on file to calculate BMI. Gen: WD/WN, NAD Head: [place]/AT, No temporalis wasting. Prominent temp pulse not noted. Ear/Nose/Throat: Hearing grossly intact, nares w/o erythema or drainage, oropharynx w/o Erythema/Exudate,  Eyes: PERRLA, EOMI.  Neck: Supple, no nuchal rigidity.  No bruit or JVD.  Pulmonary:  Good air movement, clear to auscultation bilaterally, no use of accessory muscles.  Cardiac: RRR, normal S1, S2, no Murmurs, rubs or gallops. Vascular: No thrill or bruit in HeRO graft right arm Vessel Right Left  Radial Palpable Palpable                                   Gastrointestinal: soft, non-tender/non-distended. No guarding/reflex.  Musculoskeletal: M/S 5/5 throughout.  Extremities without ischemic changes.  No deformity or atrophy.  Neurologic: CN 2-12 intact. Pain and light touch intact in extremities.  Symmetrical.  Speech is fluent. Motor exam as listed above. Psychiatric: Judgment intact, Mood & affect appropriate for pt's clinical situation. Dermatologic: No rashes or ulcers noted.  No cellulitis or open wounds. Lymph : No Cervical, Axillary, or Inguinal lymphadenopathy.     CBC Lab Results  Component Value Date   WBC 7.5 11/13/2013    HGB 11.3* 11/13/2013   HCT 36.2* 11/13/2013   MCV 77* 11/13/2013   PLT 211 11/13/2013    BMET    Component Value Date/Time   NA 137 11/13/2013 0408   NA 136 04/06/2012 1426   K 4.1 11/13/2013 0408   K 5.0 08/08/2012  1430   CL 98 11/13/2013 0408   CL 92* 04/06/2012 1426   CO2 25 11/13/2013 0408   CO2 22 04/06/2012 1426   GLUCOSE 64* 11/13/2013 0408   GLUCOSE 87 04/06/2012 1426   BUN 68* 11/13/2013 0408   BUN 78* 04/06/2012 1426   CREATININE 16.45* 11/13/2013 0408   CREATININE 17.82* 04/06/2012 1426   CALCIUM 6.8* 11/13/2013 0408   CALCIUM 8.6 04/06/2012 1426   GFRNONAA 3* 11/13/2013 0408   GFRNONAA 3* 04/06/2012 1426   GFRAA 3* 11/13/2013 0408   GFRAA 3* 04/06/2012 1426   CrCl cannot be calculated (Unknown ideal weight.).  COAG Lab Results  Component Value Date   INR 1.05 08/08/2012   INR 0.98 06/01/2011   INR 1.04 03/01/2011    Radiology No results found.   Assessment/Plan 1. Dysfunction of dialysis access with clotted graft. Will attempt salvage today with a declot. 2. End-stage renal disease. Needs graft for dialysis   Tuvia Woodrick, MD  07/06/2015 2:21 PM

## 2015-07-07 DIAGNOSIS — N2581 Secondary hyperparathyroidism of renal origin: Secondary | ICD-10-CM | POA: Diagnosis not present

## 2015-07-07 DIAGNOSIS — N186 End stage renal disease: Secondary | ICD-10-CM | POA: Diagnosis not present

## 2015-07-07 DIAGNOSIS — E611 Iron deficiency: Secondary | ICD-10-CM | POA: Diagnosis not present

## 2015-07-07 DIAGNOSIS — Z992 Dependence on renal dialysis: Secondary | ICD-10-CM | POA: Diagnosis not present

## 2015-07-08 ENCOUNTER — Encounter: Payer: Self-pay | Admitting: Vascular Surgery

## 2015-07-09 DIAGNOSIS — Z992 Dependence on renal dialysis: Secondary | ICD-10-CM | POA: Diagnosis not present

## 2015-07-09 DIAGNOSIS — N186 End stage renal disease: Secondary | ICD-10-CM | POA: Diagnosis not present

## 2015-07-09 DIAGNOSIS — E611 Iron deficiency: Secondary | ICD-10-CM | POA: Diagnosis not present

## 2015-07-09 DIAGNOSIS — N2581 Secondary hyperparathyroidism of renal origin: Secondary | ICD-10-CM | POA: Diagnosis not present

## 2015-07-11 DIAGNOSIS — N186 End stage renal disease: Secondary | ICD-10-CM | POA: Diagnosis not present

## 2015-07-11 DIAGNOSIS — E611 Iron deficiency: Secondary | ICD-10-CM | POA: Diagnosis not present

## 2015-07-11 DIAGNOSIS — N2581 Secondary hyperparathyroidism of renal origin: Secondary | ICD-10-CM | POA: Diagnosis not present

## 2015-07-11 DIAGNOSIS — Z992 Dependence on renal dialysis: Secondary | ICD-10-CM | POA: Diagnosis not present

## 2015-07-14 DIAGNOSIS — N2581 Secondary hyperparathyroidism of renal origin: Secondary | ICD-10-CM | POA: Diagnosis not present

## 2015-07-14 DIAGNOSIS — Z992 Dependence on renal dialysis: Secondary | ICD-10-CM | POA: Diagnosis not present

## 2015-07-14 DIAGNOSIS — E611 Iron deficiency: Secondary | ICD-10-CM | POA: Diagnosis not present

## 2015-07-14 DIAGNOSIS — N186 End stage renal disease: Secondary | ICD-10-CM | POA: Diagnosis not present

## 2015-07-16 DIAGNOSIS — N186 End stage renal disease: Secondary | ICD-10-CM | POA: Diagnosis not present

## 2015-07-16 DIAGNOSIS — E611 Iron deficiency: Secondary | ICD-10-CM | POA: Diagnosis not present

## 2015-07-16 DIAGNOSIS — N2581 Secondary hyperparathyroidism of renal origin: Secondary | ICD-10-CM | POA: Diagnosis not present

## 2015-07-16 DIAGNOSIS — Z992 Dependence on renal dialysis: Secondary | ICD-10-CM | POA: Diagnosis not present

## 2015-07-17 DIAGNOSIS — N186 End stage renal disease: Secondary | ICD-10-CM | POA: Diagnosis not present

## 2015-07-17 DIAGNOSIS — Z992 Dependence on renal dialysis: Secondary | ICD-10-CM | POA: Diagnosis not present

## 2015-07-18 DIAGNOSIS — Z992 Dependence on renal dialysis: Secondary | ICD-10-CM | POA: Diagnosis not present

## 2015-07-18 DIAGNOSIS — N186 End stage renal disease: Secondary | ICD-10-CM | POA: Diagnosis not present

## 2015-07-18 DIAGNOSIS — N2581 Secondary hyperparathyroidism of renal origin: Secondary | ICD-10-CM | POA: Diagnosis not present

## 2015-07-18 DIAGNOSIS — E611 Iron deficiency: Secondary | ICD-10-CM | POA: Diagnosis not present

## 2015-07-21 DIAGNOSIS — E611 Iron deficiency: Secondary | ICD-10-CM | POA: Diagnosis not present

## 2015-07-21 DIAGNOSIS — N2581 Secondary hyperparathyroidism of renal origin: Secondary | ICD-10-CM | POA: Diagnosis not present

## 2015-07-21 DIAGNOSIS — N186 End stage renal disease: Secondary | ICD-10-CM | POA: Diagnosis not present

## 2015-07-21 DIAGNOSIS — Z992 Dependence on renal dialysis: Secondary | ICD-10-CM | POA: Diagnosis not present

## 2015-07-23 DIAGNOSIS — N2581 Secondary hyperparathyroidism of renal origin: Secondary | ICD-10-CM | POA: Diagnosis not present

## 2015-07-23 DIAGNOSIS — N186 End stage renal disease: Secondary | ICD-10-CM | POA: Diagnosis not present

## 2015-07-23 DIAGNOSIS — Z992 Dependence on renal dialysis: Secondary | ICD-10-CM | POA: Diagnosis not present

## 2015-07-23 DIAGNOSIS — E611 Iron deficiency: Secondary | ICD-10-CM | POA: Diagnosis not present

## 2015-07-25 DIAGNOSIS — E611 Iron deficiency: Secondary | ICD-10-CM | POA: Diagnosis not present

## 2015-07-25 DIAGNOSIS — Z992 Dependence on renal dialysis: Secondary | ICD-10-CM | POA: Diagnosis not present

## 2015-07-25 DIAGNOSIS — N2581 Secondary hyperparathyroidism of renal origin: Secondary | ICD-10-CM | POA: Diagnosis not present

## 2015-07-25 DIAGNOSIS — N186 End stage renal disease: Secondary | ICD-10-CM | POA: Diagnosis not present

## 2015-07-28 DIAGNOSIS — Z992 Dependence on renal dialysis: Secondary | ICD-10-CM | POA: Diagnosis not present

## 2015-07-28 DIAGNOSIS — N2581 Secondary hyperparathyroidism of renal origin: Secondary | ICD-10-CM | POA: Diagnosis not present

## 2015-07-28 DIAGNOSIS — E611 Iron deficiency: Secondary | ICD-10-CM | POA: Diagnosis not present

## 2015-07-28 DIAGNOSIS — N186 End stage renal disease: Secondary | ICD-10-CM | POA: Diagnosis not present

## 2015-07-30 DIAGNOSIS — N2581 Secondary hyperparathyroidism of renal origin: Secondary | ICD-10-CM | POA: Diagnosis not present

## 2015-07-30 DIAGNOSIS — Z992 Dependence on renal dialysis: Secondary | ICD-10-CM | POA: Diagnosis not present

## 2015-07-30 DIAGNOSIS — N186 End stage renal disease: Secondary | ICD-10-CM | POA: Diagnosis not present

## 2015-07-30 DIAGNOSIS — E611 Iron deficiency: Secondary | ICD-10-CM | POA: Diagnosis not present

## 2015-08-04 ENCOUNTER — Encounter
Admission: RE | Disposition: A | Discharge status: Home / Self Care | Payer: Self-pay | Source: Ambulatory Visit | Attending: Vascular Surgery

## 2015-08-04 ENCOUNTER — Ambulatory Visit
Admission: RE | Admit: 2015-08-04 | Discharge: 2015-08-04 | Disposition: A | Discharge status: Home / Self Care | Payer: Medicare Other | Source: Ambulatory Visit | Attending: Vascular Surgery | Admitting: Vascular Surgery

## 2015-08-04 ENCOUNTER — Other Ambulatory Visit: Payer: Self-pay | Admitting: Vascular Surgery

## 2015-08-04 DIAGNOSIS — F17290 Nicotine dependence, other tobacco product, uncomplicated: Secondary | ICD-10-CM | POA: Insufficient documentation

## 2015-08-04 DIAGNOSIS — T82868S Thrombosis of vascular prosthetic devices, implants and grafts, sequela: Secondary | ICD-10-CM | POA: Diagnosis not present

## 2015-08-04 DIAGNOSIS — Z992 Dependence on renal dialysis: Secondary | ICD-10-CM | POA: Diagnosis not present

## 2015-08-04 DIAGNOSIS — M79606 Pain in leg, unspecified: Secondary | ICD-10-CM | POA: Insufficient documentation

## 2015-08-04 DIAGNOSIS — R55 Syncope and collapse: Secondary | ICD-10-CM | POA: Diagnosis not present

## 2015-08-04 DIAGNOSIS — T82868A Thrombosis of vascular prosthetic devices, implants and grafts, initial encounter: Secondary | ICD-10-CM | POA: Insufficient documentation

## 2015-08-04 DIAGNOSIS — I729 Aneurysm of unspecified site: Secondary | ICD-10-CM | POA: Insufficient documentation

## 2015-08-04 DIAGNOSIS — I110 Hypertensive heart disease with heart failure: Secondary | ICD-10-CM | POA: Diagnosis not present

## 2015-08-04 DIAGNOSIS — I509 Heart failure, unspecified: Secondary | ICD-10-CM | POA: Diagnosis not present

## 2015-08-04 DIAGNOSIS — I951 Orthostatic hypotension: Secondary | ICD-10-CM | POA: Diagnosis not present

## 2015-08-04 DIAGNOSIS — R0789 Other chest pain: Secondary | ICD-10-CM | POA: Diagnosis not present

## 2015-08-04 DIAGNOSIS — Z87891 Personal history of nicotine dependence: Secondary | ICD-10-CM | POA: Insufficient documentation

## 2015-08-04 DIAGNOSIS — N186 End stage renal disease: Secondary | ICD-10-CM | POA: Insufficient documentation

## 2015-08-04 DIAGNOSIS — I209 Angina pectoris, unspecified: Secondary | ICD-10-CM | POA: Diagnosis not present

## 2015-08-04 DIAGNOSIS — Y832 Surgical operation with anastomosis, bypass or graft as the cause of abnormal reaction of the patient, or of later complication, without mention of misadventure at the time of the procedure: Secondary | ICD-10-CM | POA: Insufficient documentation

## 2015-08-04 DIAGNOSIS — I2510 Atherosclerotic heart disease of native coronary artery without angina pectoris: Secondary | ICD-10-CM | POA: Diagnosis not present

## 2015-08-04 DIAGNOSIS — Z9889 Other specified postprocedural states: Secondary | ICD-10-CM | POA: Diagnosis not present

## 2015-08-04 DIAGNOSIS — I120 Hypertensive chronic kidney disease with stage 5 chronic kidney disease or end stage renal disease: Secondary | ICD-10-CM | POA: Insufficient documentation

## 2015-08-04 DIAGNOSIS — Z8249 Family history of ischemic heart disease and other diseases of the circulatory system: Secondary | ICD-10-CM | POA: Insufficient documentation

## 2015-08-04 DIAGNOSIS — I10 Essential (primary) hypertension: Secondary | ICD-10-CM | POA: Diagnosis not present

## 2015-08-04 DIAGNOSIS — R000 Tachycardia, unspecified: Secondary | ICD-10-CM | POA: Diagnosis not present

## 2015-08-04 HISTORY — PX: PERIPHERAL VASCULAR CATHETERIZATION: SHX172C

## 2015-08-04 LAB — POTASSIUM (ARMC VASCULAR LAB ONLY): POTASSIUM (ARMC VASCULAR LAB): 5 (ref 3.5–5.1)

## 2015-08-04 SURGERY — A/V SHUNTOGRAM/FISTULAGRAM
Anesthesia: Moderate Sedation

## 2015-08-04 MED ORDER — ONDANSETRON HCL 4 MG/2ML IJ SOLN: 4.0000 mg | Freq: Four times a day (QID) | INTRAMUSCULAR | Status: DC | PRN

## 2015-08-04 MED ORDER — FENTANYL CITRATE (PF) 100 MCG/2ML IJ SOLN
INTRAMUSCULAR | Status: DC | PRN
  Administered 2015-08-04 (×6): 50 ug via INTRAVENOUS

## 2015-08-04 MED ORDER — FAMOTIDINE 20 MG PO TABS: 40.0000 mg | ORAL_TABLET | ORAL | Status: DC | PRN

## 2015-08-04 MED ORDER — HEPARIN SODIUM (PORCINE) 1000 UNIT/ML IJ SOLN
INTRAMUSCULAR | Status: AC
Start: 2015-08-04 — End: 2015-08-04
  Filled 2015-08-04: qty 1

## 2015-08-04 MED ORDER — MIDAZOLAM HCL 5 MG/5ML IJ SOLN
INTRAMUSCULAR | Status: AC
  Filled 2015-08-04: qty 5

## 2015-08-04 MED ORDER — IOPAMIDOL (ISOVUE-300) INJECTION 61%
INTRAVENOUS | Status: DC | PRN
  Administered 2015-08-04: 70 mL via INTRAVENOUS

## 2015-08-04 MED ORDER — HYDROMORPHONE HCL 1 MG/ML IJ SOLN: 1.0000 mg | Freq: Once | INTRAMUSCULAR | Status: DC

## 2015-08-04 MED ORDER — MIDAZOLAM HCL 2 MG/2ML IJ SOLN
INTRAMUSCULAR | Status: DC | PRN
  Administered 2015-08-04: 2 mg via INTRAVENOUS
  Administered 2015-08-04 (×6): 1 mg via INTRAVENOUS

## 2015-08-04 MED ORDER — HEPARIN SODIUM (PORCINE) 1000 UNIT/ML IJ SOLN
INTRAMUSCULAR | Status: DC | PRN
  Administered 2015-08-04: 2000 [IU] via INTRAVENOUS
  Administered 2015-08-04: 4000 [IU] via INTRAVENOUS

## 2015-08-04 MED ORDER — LIDOCAINE HCL (PF) 1 % IJ SOLN
INTRAMUSCULAR | Status: AC
  Filled 2015-08-04: qty 5

## 2015-08-04 MED ORDER — METHYLPREDNISOLONE SODIUM SUCC 125 MG IJ SOLR
125.0000 mg | INTRAMUSCULAR | Status: DC | PRN
Start: 2015-08-04 — End: 2015-08-04

## 2015-08-04 MED ORDER — FENTANYL CITRATE (PF) 100 MCG/2ML IJ SOLN
INTRAMUSCULAR | Status: AC
  Filled 2015-08-04: qty 2

## 2015-08-04 MED ORDER — DEXTROSE 5 % IV SOLN: 1.5000 g | INTRAVENOUS | Status: DC

## 2015-08-04 MED ORDER — SODIUM CHLORIDE 0.9 % IV SOLN: INTRAVENOUS | Status: DC

## 2015-08-04 MED ORDER — DIPHENHYDRAMINE HCL 50 MG/ML IJ SOLN
INTRAMUSCULAR | Status: AC
  Filled 2015-08-04: qty 1

## 2015-08-04 SURGICAL SUPPLY — 20 items
BALLN DORADO 8X40X80 (BALLOONS) ×3
BALLN DORADO 9X40X80 (BALLOONS) ×3
BALLOON DORADO 8X40X80 (BALLOONS) ×1 IMPLANT
BALLOON DORADO 9X40X80 (BALLOONS) ×1 IMPLANT
CANNULA 5F STIFF (CANNULA) ×3 IMPLANT
CATH KUMPE (CATHETERS) ×2
CATH SLIP 5FR .038X65 KMP (CATHETERS) ×1
CATH SLIP 5FR 0.38 X 40 KMP (CATHETERS) ×1 IMPLANT
DEVICE PRESTO INFLATION (MISCELLANEOUS) ×3 IMPLANT
DRAPE BRACHIAL (DRAPES) ×3 IMPLANT
KIT THROMB PERC PTD (MISCELLANEOUS) ×3 IMPLANT
PACK ANGIOGRAPHY (CUSTOM PROCEDURE TRAY) ×3 IMPLANT
SHEATH BRITE TIP 6FRX5.5 (SHEATH) ×6 IMPLANT
SHEATH BRITE TIP 7FRX5.5 (SHEATH) ×3 IMPLANT
STENT VIABAHN 8X50X120 (Permanent Stent) ×3 IMPLANT
TOWEL OR 17X26 4PK STRL BLUE (TOWEL DISPOSABLE) ×3 IMPLANT
WIRE G 018X200 V18 (WIRE) ×3 IMPLANT
WIRE J 3MM .035X145CM (WIRE) ×3 IMPLANT
WIRE NITINOL .018 (WIRE) ×3 IMPLANT
WIRE ROSEN-J .035X260CM (WIRE) ×3 IMPLANT

## 2015-08-04 NOTE — H&P (Signed)
[facility] Admission History & Physical  MRN : [id number]  [name] [name] is a 50 y.o. (06-04-1965) male who presents with chief complaint of No chief complaint on file. [name]  History of Present Illness: I am asked to evaluate [name] [name] by the dialysis center and [facility]. [name] presented to the dialysis center yesterday he was found to have an access that had no thrill no bruit and no attempts at cannulation were unsuccessful yielding clots instead of blood flow. At that time we were contacted however the patient did not wish to have a procedure yesterday and has elected to undergo thrombectomy today.  Patient denies fever chills no pain at the access site no arm pain or swelling.  He has only missed 1 dialysis run but it was the weekend and therefore has not had dialysis now in 3 days. He denies uremic symptoms of nausea vomiting or changes in appetite. No shortness of breath.  Current Facility-Administered Medications  Medication Dose Route Frequency Provider Last Rate Last Dose  . 0.9 %  sodium chloride infusion   Intravenous Continuous Kimberly A Stegmayer, PA-C      . cefUROXime (ZINACEF) 1.5 g in dextrose 5 % 50 mL IVPB  1.5 g Intravenous 30 min Pre-Op Kimberly A Stegmayer, PA-C      . famotidine (PEPCID) tablet 40 mg  40 mg Oral PRN Janalyn Harder Stegmayer, PA-C      . HYDROmorphone (DILAUDID) injection 1 mg  1 mg Intravenous Once [employer], PA-C      . methylPREDNISolone sodium succinate (SOLU-MEDROL) 125 mg/2 mL injection 125 mg  125 mg Intravenous PRN Kimberly A Stegmayer, PA-C      . ondansetron (ZOFRAN) injection 4 mg  4 mg Intravenous Q6H PRN Sela Hua, PA-C        Past Medical History  Diagnosis Date  . Hypertension   . Orthostatic hypotension      2008  . Chest discomfort      2008  . Sinus tachycardia ([place])      2008, TSH normal  . Ejection fraction      65%, echo, 2008   /   EF 55-60%, echo, October 28, 2010   . Overweight(278.02)   . Syncope      positional after dialysis... 2008  . Aneurysm ([place])      Right arm fistula 3 aneurysms 2011,   plans to have a new procedure  . CHF (congestive heart failure) ([place])   . Leg pain   . Angina     occasional, last 6 mo ago  . Dialysis patient [facility])     M-W-F @ [place]  . ESRD (end stage renal disease) ([place])     on hemodialysis M-W-F [place]    Past Surgical History  Procedure Laterality Date  . Right arm graft      for dyalisis  . Umbilical hernia repair    . Thrombectomy    . Arteriovenous graft placement    . Av fistula placement    . Thrombectomy w/ embolectomy  03/01/2011    Procedure: THROMBECTOMY ARTERIOVENOUS GORE-TEX GRAFT;  Surgeon: Elam Dutch, MD;  Location: [place];  Service: Vascular;  Laterality: Right;  Attempted Thrombectomy of Old  Right Upper Arm Arteriovenous gortex Graft. Insertion of new Arteriovenous Graft using 61mm x 50cm Gortex Stretch graft.   . Insertion of dialysis catheter  03/01/2011    Procedure: INSERTION OF DIALYSIS CATHETER;  Surgeon: Juanda Crumble  Antony Blackbird, MD;  Location: [place];  Service: Vascular;  Laterality: Left;  Exchange of Dialysis Catheter to 27cm 15Fr. Arrow Catheter  . Thrombectomy w/ embolectomy  07/11/2011    Procedure: THROMBECTOMY ARTERIOVENOUS GORE-TEX GRAFT;  Surgeon: Rosetta Posner, MD;  Location: [place];  Service: Vascular;  Laterality: Right;  [name] N/A 08/23/2011    Procedure: VENOGRAM;  Surgeon: [name], MD;  Location: [facility] CATH LAB;  Service: Cardiovascular;  Laterality: N/A;  . Peripheral vascular catheterization N/A 09/01/2014    Procedure: A/V Shuntogram/Fistulagram;  Surgeon: Algernon Huxley, MD;  Location: [place] CV LAB;  Service: Cardiovascular;  Laterality: N/A;  . Peripheral vascular catheterization Right 09/01/2014    Procedure: Thrombectomy;  Surgeon: Algernon Huxley, MD;  Location: [place] CV LAB;  Service: Cardiovascular;  Laterality: Right;  . Peripheral vascular  catheterization Right 09/01/2014    Procedure: A/V Shunt Intervention;  Surgeon: Algernon Huxley, MD;  Location: [place] CV LAB;  Service: Cardiovascular;  Laterality: Right;  . Peripheral vascular catheterization N/A 09/17/2014    Procedure: A/V Shuntogram/Fistulagram;  Surgeon: Algernon Huxley, MD;  Location: [place] CV LAB;  Service: Cardiovascular;  Laterality: N/A;  . Peripheral vascular catheterization Right 10/17/2014    Procedure: Thrombectomy;  Surgeon: Katha Cabal, MD;  Location: [place] CV LAB;  Service: Cardiovascular;  Laterality: Right;  . Peripheral vascular catheterization N/A 10/17/2014    Procedure: A/V Shuntogram/Fistulagram;  Surgeon: Katha Cabal, MD;  Location: [place] CV LAB;  Service: Cardiovascular;  Laterality: N/A;  . Peripheral vascular catheterization N/A 10/17/2014    Procedure: A/V Shunt Intervention;  Surgeon: Katha Cabal, MD;  Location: [place] CV LAB;  Service: Cardiovascular;  Laterality: N/A;  . Peripheral vascular catheterization Right 11/04/2014    Procedure: Thrombectomy;  Surgeon: Katha Cabal, MD;  Location: [place] CV LAB;  Service: Cardiovascular;  Laterality: Right;  . Peripheral vascular catheterization Left 11/04/2014    Procedure: Visceral Venography;  Surgeon: Katha Cabal, MD;  Location: [place] CV LAB;  Service: Cardiovascular;  Laterality: Left;  . Peripheral vascular catheterization Right 11/27/2014    Procedure: A/V Shuntogram/Fistulagram;  Surgeon: Algernon Huxley, MD;  Location: [place] CV LAB;  Service: Cardiovascular;  Laterality: Right;  . Peripheral vascular catheterization Right 11/27/2014    Procedure: A/V Shunt Intervention;  Surgeon: Algernon Huxley, MD;  Location: [place] CV LAB;  Service: Cardiovascular;  Laterality: Right;  . Peripheral vascular catheterization Right 12/31/2014    Procedure: Thrombectomy;  Surgeon: Algernon Huxley, MD;  Location: [place] CV LAB;  Service:  Cardiovascular;  Laterality: Right;  . Peripheral vascular catheterization Right 01/12/2015    Procedure: A/V Shuntogram/Fistulagram;  Surgeon: Algernon Huxley, MD;  Location: [place] CV LAB;  Service: Cardiovascular;  Laterality: Right;  . Peripheral vascular catheterization N/A 01/12/2015    Procedure: A/V Shunt Intervention;  Surgeon: Algernon Huxley, MD;  Location: [place] [facility] LAB;  Service: Cardiovascular;  Laterality: N/A;  . Peripheral vascular catheterization Right 01/28/2015    Procedure: Thrombectomy;  Surgeon: Algernon Huxley, MD;  Location: [place] CV LAB;  Service: Cardiovascular;  Laterality: Right;  . Peripheral vascular catheterization N/A 06/08/2015    Procedure: A/V Shuntogram/Fistulagram;  Surgeon: Algernon Huxley, MD;  Location: [place] CV LAB;  Service: Cardiovascular;  Laterality: N/A;  . Peripheral vascular catheterization N/A 06/08/2015    Procedure: A/V Shunt Intervention;  Surgeon: Algernon Huxley, MD;  Location:  [place] CV LAB;  Service: Cardiovascular;  Laterality: N/A;  . Peripheral vascular catheterization Right 07/06/2015    Procedure: A/V Shuntogram/Fistulagram;  Surgeon: Algernon Huxley, MD;  Location: [place] CV LAB;  Service: Cardiovascular;  Laterality: Right;  . Peripheral vascular catheterization N/A 07/06/2015    Procedure: A/V Shunt Intervention;  Surgeon: Algernon Huxley, MD;  Location: [place] CV LAB;  Service: Cardiovascular;  Laterality: N/A;    Social History Social History  Substance Use Topics  . Smoking status: Former Smoker    Types: Cigarettes    Quit date: 04/18/1994  . Smokeless tobacco: Current User    Types: Snuff     Comment: dips 1/2 snuff per day times 25 years  . Alcohol Use: No    Family History Family History  Problem Relation Age of Onset  . Hypertension Mother   . Heart disease Mother   . Hypertension Father   . Heart disease Father   No family history of bleeding clotting disorders porphyria or autoimmune  disease.  No Known Allergies   REVIEW OF SYSTEMS (Negative unless checked)  Constitutional: [] Weight loss  [] Fever  [] Chills Cardiac: [] Chest pain   [] Chest pressure   [] Palpitations   [] Shortness of breath when laying flat   [] Shortness of breath at rest   [] Shortness of breath with exertion. Vascular:  [] Pain in legs with walking   [] Pain in legs at rest   [] Pain in legs when laying flat   [] Claudication   [] Pain in feet when walking  [] Pain in feet at rest  [] Pain in feet when laying flat   [] History of DVT   [] Phlebitis   [] Swelling in legs   [] Varicose veins   [] Non-healing ulcers Pulmonary:   [] Uses home oxygen   [] Productive cough   [] Hemoptysis   [] Wheeze  [] COPD   [] Asthma Neurologic:  [] Dizziness  [] Blackouts   [] Seizures   [] History of stroke   [] History of TIA  [] Aphasia   [] Temporary blindness   [] Dysphagia   [] Weakness or numbness in arms   [] Weakness or numbness in legs Musculoskeletal:  [] Arthritis   [] Joint swelling   [] Joint pain   [] Low back pain Hematologic:  [] Easy bruising  [] Easy bleeding   [] Hypercoagulable state   [] Anemic  [] Hepatitis Gastrointestinal:  [] Blood in stool   [] Vomiting blood  [] Gastroesophageal reflux/heartburn   [] Difficulty swallowing. Genitourinary:  [] Chronic kidney disease   [] Difficult urination  [] Frequent urination  [] Burning with urination   [] Blood in urine Skin:  [] Rashes   [] Ulcers   [] Wounds Psychological:  [] History of anxiety   []  History of major depression.  Physical Examination  Filed Vitals:   08/04/15 1236  BP: 126/88  Pulse: 75  Temp: 97.9 F (36.6 C)  TempSrc: Oral  Resp: 18  SpO2: 99%   There is no weight on file to calculate BMI. Gen: WD/WN, NAD Head: [place]/AT, No temporalis wasting.  Ear/Nose/Throat: Hearing grossly intact, nares w/o erythema or drainage, oropharynx w/o Erythema/Exudate, Eyes: PERRLA, EOMI.  Neck: Supple, no nuchal rigidity.  No bruit or JVD.  Pulmonary:  Good air movement, clear to auscultation  bilaterally, no increased work of respiration or use of accessory muscles  Cardiac: RRR, normal S1, S2, no Murmurs, rubs or gallops. Vascular: Right arm AV graft no thrill no bruit. No erythema no drainage or ulceration no evidence of cellulitis. Vessel Right Left  Radial Palpable Palpable  Ulnar Palpable Palpable  Brachial Palpable Palpable  Carotid Palpable, without bruit Palpable, without bruit  Gastrointestinal: Obese soft, non-tender/non-distended. No guarding/reflex. No masses, surgical incisions, or scars. Musculoskeletal: M/S 5/5 throughout.  No deformity or atrophy. Neurologic: CN 2-12 intact. Pain and light touch intact in extremities.  Symmetrical.  Speech is fluent. Motor exam as listed above. Psychiatric: Judgment intact, Mood & affect appropriate for pt's clinical situation. Dermatologic: No rashes or ulcers noted.  No cellulitis or open wounds. Lymph : No Cervical, Axillary, or Inguinal lymphadenopathy.   CBC Lab Results  Component Value Date   WBC 7.5 11/13/2013   HGB 11.3* 11/13/2013   HCT 36.2* 11/13/2013   MCV 77* 11/13/2013   PLT 211 11/13/2013    BMET    Component Value Date/Time   NA 137 11/13/2013 0408   NA 136 04/06/2012 1426   K 4.1 11/13/2013 0408   K 5.0 08/08/2012 1430   CL 98 11/13/2013 0408   CL 92* 04/06/2012 1426   CO2 25 11/13/2013 0408   CO2 22 04/06/2012 1426   GLUCOSE 64* 11/13/2013 0408   GLUCOSE 87 04/06/2012 1426   BUN 68* 11/13/2013 0408   BUN 78* 04/06/2012 1426   CREATININE 16.45* 11/13/2013 0408   CREATININE 17.82* 04/06/2012 1426   CALCIUM 6.8* 11/13/2013 0408   CALCIUM 8.6 04/06/2012 1426   GFRNONAA 3* 11/13/2013 0408   GFRNONAA 3* 04/06/2012 1426   GFRAA 3* 11/13/2013 0408   GFRAA 3* 04/06/2012 1426   CrCl cannot be calculated (Unknown ideal weight.).  COAG Lab Results  Component Value Date   INR 1.05 08/08/2012   INR 0.98 06/01/2011   INR 1.04 03/01/2011    Radiology No results found.  Assessment/Plan 1.   Complication dialysis device with thrombosis AV access:  Patient's right arm dialysis access is thrombosed. The patient will undergo thrombectomy using interventional techniques. Potassium will be drawn to ensure that it is an appropriate level prior to performing thrombectomy. 2.  End-stage renal disease requiring hemodialysis:  Patient will continue dialysis therapy without further interruption if a successful thrombectomy is not achieved then catheter will be placed. Dialysis has already been arranged since the patient missed their previous session 3.  Hypertension:  Patient will continue medical management; nephrology is following no changes in oral medications. 4.  Coronary artery disease:  EKG will be monitored. Nitrates will be used if needed. The patient's oral cardiac medications will be continued.     Marti Acebo, Dolores Lory, MD  08/04/2015 1:21 PM

## 2015-08-04 NOTE — Op Note (Signed)
OPERATIVE NOTE   PROCEDURE: 1. Contrast injection right arm hero graft 2. Mechanical thrombectomy right arm hero graft with Trerotola device 3. Percutaneous transluminal angioplasty and stent placement venous portion right arm hero graft  PRE-OPERATIVE DIAGNOSIS: Complication of dialysis access                                                       End Stage Renal Disease  POST-OPERATIVE DIAGNOSIS: same as above   SURGEON: Katha Cabal, M.D.  ANESTHESIA: Conscious Sedation   ESTIMATED BLOOD LOSS: minimal  FINDING(S): 1. Thrombus noted throughout the hero graft after initial passes with a Trerotola there is significant deformation of the stents in the midportion where the graft is being accessed. I think it is likely secondary to the repetitive access with needles. I suspect that this narrowing is what caused the thrombosis of the AV graft  SPECIMEN(S):  None  CONTRAST: 70 cc  FLUOROSCOPY TIME: 13.8 minutes  INDICATIONS: [name] [name] is a 50 y.o. male who  presents with malfunctioning right arm AV access.  The patient is scheduled for angiography with possible intervention of the AV access.  The patient is aware the risks include but are not limited to: bleeding, infection, thrombosis of the cannulated access, and possible anaphylactic reaction to the contrast.  The patient acknowledges if the access can not be salvaged a tunneled catheter will be needed and will be placed during this procedure.  The patient is aware of the risks of the procedure and elects to proceed with the angiogram and intervention.  DESCRIPTION: After full informed written consent was obtained, the patient was brought back to the Special Procedure suite and placed supine position.  Appropriate cardiopulmonary monitors were placed.  The right arm was prepped and draped in the standard fashion.  Appropriate timeout is called. The right arm AV graft  was cannulated with a micropuncture needle near the arterial  anastomosis in an antegrade direction.  The microwire was advanced and the needle was exchanged for  a microsheath.  The J-wire was then advanced and a 6 Fr sheath inserted.  Hand was then performed which demonstrated thrombus within the AV access.  The central venous structures were also imaged by hand injections.  3000 units of heparin was given and allowed to circulate as well.  A Trerotola device was then advanced beginning centrally and pulling back performing.  Several passes were made through the venous portion of the graft. Follow-up imaging now demonstrates the vast majority of the clot had been treated. Therefore a retrograde sheath was inserted. This too was a 6 [place] sheath was positioned more proximally on the arm and angled in the retrograde direction. Subsequently a floppy Glidewire and a KMP catheter were negotiated into the arterial system hand injection contrast was then utilized to demonstrate patency of the artery as well as the location for the anastomosis. The Trerotola device was now advanced through the retrograde sheath was extended out into the artery the basket was opened and it was slowly pulled back into the graft and then the basket was engaged. Several passes were made on the arterial portion and pulsatility of the access was reestablished. Follow-up imaging demonstrates there was now thrombus in the venous portion surrounding the sheath and this was treated with the Trerotola device from the antegrade direction.  Also noted at this time was a marked irregularity of the stents in the midportion where the graft is repetitively accessed. After several passes imaging demonstrated resolution of thrombus within the graft and forward flow however stricture of the graft was noted in the midportion of the venous segment of the graft.  Rosen wire in association with a Kumpe catheter was then advanced through the antegrade sheath and an 8 x 6 [place] balloon was used to angioplasty the  venous portion of the AV access in the area of stricture as described above. Multiple inflations were performed inflation times with 30 seconds to 1 minute with maximum pressures of 20 ATM.  With the balloon inflated reflux of contrast was performed demonstrating the arterial anastomosis. The arterial anastomosis was patent but residual thrombus was noted. This was later removed with several more passes of the Trerotola device without further problem.   Contrast was then injected in the forward direction demonstrating rapid flow.  A 4-0 Monocryl purse-string suture was sewn around both of the sheaths.  The sheaths were removed and light pressure was applied.  A sterile bandage was applied to the puncture site.    COMPLICATIONS: None  CONDITION: [name] [name], M.D [facility] Office: [phone]  08/04/2015 3:51 PM

## 2015-08-05 ENCOUNTER — Encounter: Payer: Self-pay | Admitting: Vascular Surgery

## 2015-08-06 DIAGNOSIS — Z992 Dependence on renal dialysis: Secondary | ICD-10-CM | POA: Diagnosis not present

## 2015-08-06 DIAGNOSIS — E611 Iron deficiency: Secondary | ICD-10-CM | POA: Diagnosis not present

## 2015-08-06 DIAGNOSIS — N186 End stage renal disease: Secondary | ICD-10-CM | POA: Diagnosis not present

## 2015-08-06 DIAGNOSIS — N2581 Secondary hyperparathyroidism of renal origin: Secondary | ICD-10-CM | POA: Diagnosis not present

## 2015-08-08 DIAGNOSIS — Z992 Dependence on renal dialysis: Secondary | ICD-10-CM | POA: Diagnosis not present

## 2015-08-08 DIAGNOSIS — N186 End stage renal disease: Secondary | ICD-10-CM | POA: Diagnosis not present

## 2015-08-08 DIAGNOSIS — E611 Iron deficiency: Secondary | ICD-10-CM | POA: Diagnosis not present

## 2015-08-08 DIAGNOSIS — N2581 Secondary hyperparathyroidism of renal origin: Secondary | ICD-10-CM | POA: Diagnosis not present

## 2015-08-11 DIAGNOSIS — E611 Iron deficiency: Secondary | ICD-10-CM | POA: Diagnosis not present

## 2015-08-11 DIAGNOSIS — Z992 Dependence on renal dialysis: Secondary | ICD-10-CM | POA: Diagnosis not present

## 2015-08-11 DIAGNOSIS — N186 End stage renal disease: Secondary | ICD-10-CM | POA: Diagnosis not present

## 2015-08-11 DIAGNOSIS — N2581 Secondary hyperparathyroidism of renal origin: Secondary | ICD-10-CM | POA: Diagnosis not present

## 2015-08-13 DIAGNOSIS — N186 End stage renal disease: Secondary | ICD-10-CM | POA: Diagnosis not present

## 2015-08-13 DIAGNOSIS — E611 Iron deficiency: Secondary | ICD-10-CM | POA: Diagnosis not present

## 2015-08-13 DIAGNOSIS — N2581 Secondary hyperparathyroidism of renal origin: Secondary | ICD-10-CM | POA: Diagnosis not present

## 2015-08-13 DIAGNOSIS — Z992 Dependence on renal dialysis: Secondary | ICD-10-CM | POA: Diagnosis not present

## 2015-08-15 DIAGNOSIS — N2581 Secondary hyperparathyroidism of renal origin: Secondary | ICD-10-CM | POA: Diagnosis not present

## 2015-08-15 DIAGNOSIS — E611 Iron deficiency: Secondary | ICD-10-CM | POA: Diagnosis not present

## 2015-08-15 DIAGNOSIS — Z992 Dependence on renal dialysis: Secondary | ICD-10-CM | POA: Diagnosis not present

## 2015-08-15 DIAGNOSIS — N186 End stage renal disease: Secondary | ICD-10-CM | POA: Diagnosis not present

## 2015-08-16 DIAGNOSIS — Z992 Dependence on renal dialysis: Secondary | ICD-10-CM | POA: Diagnosis not present

## 2015-08-16 DIAGNOSIS — N186 End stage renal disease: Secondary | ICD-10-CM | POA: Diagnosis not present

## 2015-08-17 DIAGNOSIS — N186 End stage renal disease: Secondary | ICD-10-CM | POA: Diagnosis not present

## 2015-08-17 DIAGNOSIS — T82318A Breakdown (mechanical) of other vascular grafts, initial encounter: Secondary | ICD-10-CM | POA: Diagnosis not present

## 2015-08-17 DIAGNOSIS — T82868S Thrombosis of vascular prosthetic devices, implants and grafts, sequela: Secondary | ICD-10-CM | POA: Diagnosis not present

## 2015-08-17 DIAGNOSIS — Z992 Dependence on renal dialysis: Secondary | ICD-10-CM | POA: Diagnosis not present

## 2015-08-17 DIAGNOSIS — Y841 Kidney dialysis as the cause of abnormal reaction of the patient, or of later complication, without mention of misadventure at the time of the procedure: Secondary | ICD-10-CM | POA: Diagnosis not present

## 2015-08-17 DIAGNOSIS — I10 Essential (primary) hypertension: Secondary | ICD-10-CM | POA: Diagnosis not present

## 2015-08-18 DIAGNOSIS — N2581 Secondary hyperparathyroidism of renal origin: Secondary | ICD-10-CM | POA: Diagnosis not present

## 2015-08-18 DIAGNOSIS — Z992 Dependence on renal dialysis: Secondary | ICD-10-CM | POA: Diagnosis not present

## 2015-08-18 DIAGNOSIS — E611 Iron deficiency: Secondary | ICD-10-CM | POA: Diagnosis not present

## 2015-08-18 DIAGNOSIS — N186 End stage renal disease: Secondary | ICD-10-CM | POA: Diagnosis not present

## 2015-08-20 DIAGNOSIS — N2581 Secondary hyperparathyroidism of renal origin: Secondary | ICD-10-CM | POA: Diagnosis not present

## 2015-08-20 DIAGNOSIS — N186 End stage renal disease: Secondary | ICD-10-CM | POA: Diagnosis not present

## 2015-08-20 DIAGNOSIS — Z992 Dependence on renal dialysis: Secondary | ICD-10-CM | POA: Diagnosis not present

## 2015-08-20 DIAGNOSIS — E611 Iron deficiency: Secondary | ICD-10-CM | POA: Diagnosis not present

## 2015-08-22 DIAGNOSIS — N186 End stage renal disease: Secondary | ICD-10-CM | POA: Diagnosis not present

## 2015-08-22 DIAGNOSIS — Z992 Dependence on renal dialysis: Secondary | ICD-10-CM | POA: Diagnosis not present

## 2015-08-22 DIAGNOSIS — E611 Iron deficiency: Secondary | ICD-10-CM | POA: Diagnosis not present

## 2015-08-22 DIAGNOSIS — N2581 Secondary hyperparathyroidism of renal origin: Secondary | ICD-10-CM | POA: Diagnosis not present

## 2015-08-25 ENCOUNTER — Other Ambulatory Visit: Payer: Self-pay | Admitting: Vascular Surgery

## 2015-08-25 ENCOUNTER — Ambulatory Visit
Admission: RE | Admit: 2015-08-25 | Discharge: 2015-08-25 | Disposition: A | Discharge status: Home / Self Care | Payer: Medicare Other | Source: Ambulatory Visit | Attending: Vascular Surgery | Admitting: Vascular Surgery

## 2015-08-25 ENCOUNTER — Encounter
Admission: RE | Disposition: A | Discharge status: Home / Self Care | Payer: Self-pay | Source: Ambulatory Visit | Attending: Vascular Surgery

## 2015-08-25 DIAGNOSIS — N186 End stage renal disease: Secondary | ICD-10-CM | POA: Diagnosis not present

## 2015-08-25 DIAGNOSIS — Z79899 Other long term (current) drug therapy: Secondary | ICD-10-CM | POA: Diagnosis not present

## 2015-08-25 DIAGNOSIS — N289 Disorder of kidney and ureter, unspecified: Secondary | ICD-10-CM | POA: Diagnosis not present

## 2015-08-25 DIAGNOSIS — Z7902 Long term (current) use of antithrombotics/antiplatelets: Secondary | ICD-10-CM | POA: Diagnosis not present

## 2015-08-25 DIAGNOSIS — Z8249 Family history of ischemic heart disease and other diseases of the circulatory system: Secondary | ICD-10-CM | POA: Diagnosis not present

## 2015-08-25 DIAGNOSIS — Y832 Surgical operation with anastomosis, bypass or graft as the cause of abnormal reaction of the patient, or of later complication, without mention of misadventure at the time of the procedure: Secondary | ICD-10-CM | POA: Insufficient documentation

## 2015-08-25 DIAGNOSIS — Z8489 Family history of other specified conditions: Secondary | ICD-10-CM | POA: Diagnosis not present

## 2015-08-25 DIAGNOSIS — Z7982 Long term (current) use of aspirin: Secondary | ICD-10-CM | POA: Diagnosis not present

## 2015-08-25 DIAGNOSIS — Z833 Family history of diabetes mellitus: Secondary | ICD-10-CM | POA: Diagnosis not present

## 2015-08-25 DIAGNOSIS — Z9889 Other specified postprocedural states: Secondary | ICD-10-CM | POA: Diagnosis not present

## 2015-08-25 DIAGNOSIS — T82318A Breakdown (mechanical) of other vascular grafts, initial encounter: Secondary | ICD-10-CM | POA: Diagnosis not present

## 2015-08-25 DIAGNOSIS — Y841 Kidney dialysis as the cause of abnormal reaction of the patient, or of later complication, without mention of misadventure at the time of the procedure: Secondary | ICD-10-CM | POA: Diagnosis not present

## 2015-08-25 DIAGNOSIS — E669 Obesity, unspecified: Secondary | ICD-10-CM | POA: Diagnosis not present

## 2015-08-25 DIAGNOSIS — I868 Varicose veins of other specified sites: Secondary | ICD-10-CM | POA: Insufficient documentation

## 2015-08-25 DIAGNOSIS — I509 Heart failure, unspecified: Secondary | ICD-10-CM | POA: Insufficient documentation

## 2015-08-25 DIAGNOSIS — I10 Essential (primary) hypertension: Secondary | ICD-10-CM | POA: Diagnosis not present

## 2015-08-25 DIAGNOSIS — T82868A Thrombosis of vascular prosthetic devices, implants and grafts, initial encounter: Secondary | ICD-10-CM | POA: Diagnosis present

## 2015-08-25 DIAGNOSIS — Z6841 Body Mass Index (BMI) 40.0 and over, adult: Secondary | ICD-10-CM | POA: Insufficient documentation

## 2015-08-25 DIAGNOSIS — Z992 Dependence on renal dialysis: Secondary | ICD-10-CM | POA: Diagnosis not present

## 2015-08-25 HISTORY — PX: PERIPHERAL VASCULAR CATHETERIZATION: SHX172C

## 2015-08-25 LAB — POTASSIUM (ARMC VASCULAR LAB ONLY): Potassium (ARMC vascular lab): 4.6 (ref 3.5–5.1)

## 2015-08-25 SURGERY — A/V SHUNTOGRAM/FISTULAGRAM
Anesthesia: Moderate Sedation | Site: Arm Upper | Laterality: Right

## 2015-08-25 MED ORDER — MIDAZOLAM HCL 2 MG/2ML IJ SOLN
INTRAMUSCULAR | Status: AC
  Filled 2015-08-25: qty 2

## 2015-08-25 MED ORDER — DEXTROSE 5 % IV SOLN
1.5000 g | INTRAVENOUS | Status: AC
  Administered 2015-08-25: 1.5 g via INTRAVENOUS

## 2015-08-25 MED ORDER — FENTANYL CITRATE (PF) 100 MCG/2ML IJ SOLN
INTRAMUSCULAR | Status: DC | PRN
  Administered 2015-08-25 (×2): 25 ug via INTRAVENOUS
  Administered 2015-08-25 (×2): 50 ug via INTRAVENOUS
  Administered 2015-08-25: 25 ug via INTRAVENOUS

## 2015-08-25 MED ORDER — FENTANYL CITRATE (PF) 100 MCG/2ML IJ SOLN
INTRAMUSCULAR | Status: AC
  Filled 2015-08-25: qty 4

## 2015-08-25 MED ORDER — HEPARIN (PORCINE) IN NACL 2-0.9 UNIT/ML-% IJ SOLN
INTRAMUSCULAR | Status: AC
  Filled 2015-08-25: qty 1000

## 2015-08-25 MED ORDER — MIDAZOLAM HCL 5 MG/5ML IJ SOLN
INTRAMUSCULAR | Status: AC
  Filled 2015-08-25: qty 5

## 2015-08-25 MED ORDER — HEPARIN SODIUM (PORCINE) 1000 UNIT/ML IJ SOLN
INTRAMUSCULAR | Status: DC | PRN
  Administered 2015-08-25: 4000 [IU] via INTRAVENOUS

## 2015-08-25 MED ORDER — MIDAZOLAM HCL 2 MG/2ML IJ SOLN
INTRAMUSCULAR | Status: DC | PRN
  Administered 2015-08-25 (×4): 1 mg via INTRAVENOUS
  Administered 2015-08-25: 2 mg via INTRAVENOUS

## 2015-08-25 MED ORDER — LIDOCAINE-EPINEPHRINE (PF) 1 %-1:200000 IJ SOLN
INTRAMUSCULAR | Status: AC
  Filled 2015-08-25: qty 30

## 2015-08-25 MED ORDER — ONDANSETRON HCL 4 MG/2ML IJ SOLN: 4.0000 mg | Freq: Four times a day (QID) | INTRAMUSCULAR | Status: DC | PRN

## 2015-08-25 MED ORDER — HEPARIN SODIUM (PORCINE) 1000 UNIT/ML IJ SOLN
INTRAMUSCULAR | Status: AC
  Filled 2015-08-25: qty 1

## 2015-08-25 MED ORDER — HEPARIN SODIUM (PORCINE) 10000 UNIT/ML IJ SOLN
INTRAMUSCULAR | Status: AC
  Filled 2015-08-25: qty 1

## 2015-08-25 MED ORDER — IOPAMIDOL (ISOVUE-300) INJECTION 61%
INTRAVENOUS | Status: DC | PRN
  Administered 2015-08-25: 5 mL via INTRAVENOUS

## 2015-08-25 MED ORDER — SODIUM CHLORIDE 0.9 % IV SOLN
INTRAVENOUS | Status: DC
  Administered 2015-08-25 (×2): via INTRAVENOUS

## 2015-08-25 MED ORDER — LIDOCAINE HCL (PF) 1 % IJ SOLN
INTRAMUSCULAR | Status: AC
  Filled 2015-08-25: qty 30

## 2015-08-25 MED ORDER — HYDROMORPHONE HCL 1 MG/ML IJ SOLN: 1.0000 mg | Freq: Once | INTRAMUSCULAR | Status: DC

## 2015-08-25 MED ORDER — METHYLPREDNISOLONE SODIUM SUCC 125 MG IJ SOLR: 125.0000 mg | INTRAMUSCULAR | Status: DC | PRN

## 2015-08-25 SURGICAL SUPPLY — 15 items
CANNULA 5F STIFF (CANNULA) ×3 IMPLANT
CATH 5F KA2 (CATHETERS) ×3 IMPLANT
CATH PALINDROME RT-P 15FX19CM (CATHETERS) ×3 IMPLANT
DRAPE BRACHIAL (DRAPES) ×3 IMPLANT
DRAPE INCISE IOBAN 66X45 STRL (DRAPES) ×3 IMPLANT
GOWN STRL XL  DISP (MISCELLANEOUS) ×3 IMPLANT
GUIDEWIRE SUPER STIFF .035X180 (WIRE) ×3 IMPLANT
KIT THROMB PERC PTD (MISCELLANEOUS) ×3 IMPLANT
PACK ANGIOGRAPHY (CUSTOM PROCEDURE TRAY) ×3 IMPLANT
PREP CHG 10.5 TEAL (MISCELLANEOUS) ×6 IMPLANT
SHEATH BRITE TIP 6FRX5.5 (SHEATH) ×3 IMPLANT
SUT MNCRL AB 4-0 PS2 18 (SUTURE) ×3 IMPLANT
TOWEL OR 17X26 4PK STRL BLUE (TOWEL DISPOSABLE) ×6 IMPLANT
WIRE J 3MM .035X145CM (WIRE) ×3 IMPLANT
WIRE MAGIC TOR.035 180C (WIRE) ×3 IMPLANT

## 2015-08-25 NOTE — Final Progress Note (Signed)
Not sure what Epic is requesting here

## 2015-08-25 NOTE — Op Note (Signed)
[facility]  Percutaneous Study/Intervention Procedural Note   Date of Surgery: 08/25/2015,7:35 PM  Surgeon:Ascencion Stegner, Dolores Lory   Pre-operative Diagnosis: Complication of dialysis access with thrombosis right arm hero graft; end-stage renal disease requiring hemodialysis; superior vena cava syndrome  Post-operative diagnosis:  Same  Procedure(s) Performed:  1.  Contrast injection right arm hero graft  2.  Thrombectomy right arm hero graft-unsuccessful  3.  Removal of venous outflow right arm hero graft  4.  Insertion of cuff tunneled dialysis catheter same venous access right internal jugular vein   Anesthesia: Conscious sedation was administered under my direct supervision. IV Versed plus fentanyl were utilized. Continuous ECG, pulse oximetry and blood pressure was monitored throughout the entire procedure. Versed and fentanyl were utilized.  Conscious sedation was administered for a total of 70 minutes.  Sheath: 6 French antegrade right arm hero graft  Contrast: 10 cc   Fluoroscopy Time: Approximately 6 minutes  Indications:  Patient is sent by dialysis secondary thrombosis of his AV graft.  Procedure:  [name] [name] a 50 y.o. male who was identified and appropriate procedural time out was performed.  The patient was then placed supine on the table and prepped and his right arm is extended and then draped in the usual sterile fashion.    One percent lidocaine is infiltrated in soft tissues overlying the graft near the arterial anastomosis and the graft is accessed with a micropuncture needle microwire followed micro-sheath was then inserted. J-wire followed by 6 French sheath. Hand injected contrast demonstrates thrombus throughout the graft. 4000 units of heparin was given.  Trerotola device is then prepped on the table and advanced under fluoroscopic guidance to the level of the atrium with a basket was opened is then slowly withdrawn back into the PTFE  portion of the graft where it is engaged. Unfortunately attempts at passing the Trerotola through the previously stented segment some areas which have 2-3 stents overlapping each other were completely unsuccessful. It appeared quite obvious that this would not be a successful thrombectomy area did  The patient was then reprepped and draped in a sterile fashion for a right IJ tunneled catheter. The venous outflow component of the hero graft was identified by palpation at the base of the neck. 1% lidocaine with epinephrine was infiltrated in soft tissues and a linear incision approximately 2 cm long was created the dissection was then carried down and the venous outflow segment visualized. It was then grabbed with a Kelly clamp delivered into the operative field where with medial retraction it was ligated with 0 silk tie. Was then transected just medial to the tie and this portion was allowed to retract back toward the arm. The Amplatz Super Stiff wire was then advanced through the intravascular segment under fluoroscopic guidance and negotiated into the inferior vena cava. Remaining stone portion of the intravascular section was removed and the introducer sheath with hemostatic dilator advanced over the wire under fluoroscopic guidance.  19 cm tip to cuff palindrome catheter was then advanced over the wire through the peel-away sheath and the peel-away sheath was removed. With the catheter under fluoroscopic visualization was positioned with its tip in the mid atrium and then the wire was removed. Exit site was selected areas small incision was created and the tunneling device passed subcutaneously and the catheter was pulled subcutaneously. It was then transected and the hub assembly connected. Both lumens aspirated and flushed easily. Under fluoroscopy it was checked and verified the tip was  in the atrial position. Was then packed with 5000 units of heparin as there were no kinks in the catheter was smooth in  contour.  Catheter was then secured to the chest wall with 2-0 silk sutures and a Biopatch and red capsule placed. The neck incision was closed in layers using 3-0 Vicryl in running fashion by followed by 4-0 Monocryl and Dermabond.  Disposition: Patient was taken to the recovery room in stable condition having tolerated the procedure well.  [name] [name], Dolores Lory 08/25/2015,7:35 PM

## 2015-08-25 NOTE — Progress Notes (Signed)
Pt here today for fistulogram of dialysis access, attached to monitor for cont. etc02 as well as vitals, being transmitted per rad,tech. Will cont to monitor for modarate sedation

## 2015-08-25 NOTE — Progress Notes (Signed)
Pt clinically stable post procedure, awake/alert and oriented, wife present, Dr Delana Meyer out to speak with pt. To followup in office to set up for new access/ vs revision, vss, taking po's without difficulty, discharge instructions given.

## 2015-08-25 NOTE — Discharge Instructions (Signed)
, Care After °Refer to this sheet in the next few weeks. These instructions provide you with information on caring for yourself after your procedure. Your caregiver may also give you more specific instructions. Your treatment has been planned according to current medical practices, but problems sometimes occur. Call your caregiver if you have any problems or questions after your procedure.  °HOME CARE INSTRUCTIONS °· Rest at home the day of the procedure. You will likely be able to return to normal activities the following day. °· Follow your caregiver's specific instructions for the type of device that you have. °· Only take over-the-counter or prescription medicines as directed by your caregiver. °· Keep the insertion site of the catheter clean and dry at all times. °¨ Change the bandages (dressings) over the catheter site as directed by your caregiver. °¨ Wash the area around the catheter site during each dressing change. Sponge bathe the area using a germ-killing (antiseptic) solution as directed by your caregiver. °¨ Look for redness or swelling at the insertion site during each dressing change. °· Apply an antibiotic ointment as directed by your caregiver. °· Flush your catheter as directed to keep it from becoming clogged. °· Always wash your hands thoroughly before changing dressings or flushing the catheter. °· Do not let air enter the catheter. °¨ Never open the cap at the catheter tip. °¨ Always make sure there is no air in the syringe or in the tubing for infusions.    °· Do not lift anything heavy. °· Do not drive until your caregiver approves. °· Do not shower or bathe until your caregiver approves. When you shower or bathe, place a piece of plastic wrap over the catheter site. Do not allow the catheter site or the dressing to get wet. If taking a bath, do not allow the catheter to get submerged in the water. °If the catheter was inserted through an arm vein:  °· Avoid wearing tight clothes or jewelry  on the arm that has the catheter.   °· Do not sleep with your head on the arm that has the catheter.   °· Do not allow use of a blood pressure cuff on the arm that has the catheter.   °· Do not let anyone draw blood from the arm that has the catheter, except through the catheter itself. °SEEK MEDICAL CARE IF: °· You have bleeding at the insertion site of the catheter.   °· You feel weak or nauseous.   °· Your catheter is not working properly.   °· You have redness, pain, swelling, and warmth at the insertion site.   °· You notice fluid draining from the insertion site.   °SEEK IMMEDIATE MEDICAL CARE IF: °· Your catheter breaks or has a hole in it.   °· Your catheter comes loose or gets pulled completely out. If this happens, hold firm pressure over the area with your hand or a clean cloth.   °· You have a fever. °· You have chills.   °· Your catheter becomes totally blocked.   °· You have swelling in your arm, shoulder, neck, or face.   °· You have bleeding from the insertion site that does not stop.   °· You develop chest pain or have trouble breathing.   °· You feel dizzy or faint.   °MAKE SURE YOU: °· Understand these instructions. °· Will watch your condition. °· Will get help right away if you are not doing well or get worse. °  °This information is not intended to replace advice given to you by your health care provider. Make sure you discuss any questions you   have with your health care provider. °  °Document Released: 03/21/2012 Document Revised: 12/05/2012 Document Reviewed: 03/21/2012 °[employer] Interactive Patient Education ©2016 [employer] ° °

## 2015-08-26 ENCOUNTER — Encounter: Payer: Self-pay | Admitting: Vascular Surgery

## 2015-08-27 DIAGNOSIS — N2581 Secondary hyperparathyroidism of renal origin: Secondary | ICD-10-CM | POA: Diagnosis not present

## 2015-08-27 DIAGNOSIS — Z992 Dependence on renal dialysis: Secondary | ICD-10-CM | POA: Diagnosis not present

## 2015-08-27 DIAGNOSIS — N186 End stage renal disease: Secondary | ICD-10-CM | POA: Diagnosis not present

## 2015-08-27 DIAGNOSIS — E611 Iron deficiency: Secondary | ICD-10-CM | POA: Diagnosis not present

## 2015-08-29 DIAGNOSIS — N186 End stage renal disease: Secondary | ICD-10-CM | POA: Diagnosis not present

## 2015-08-29 DIAGNOSIS — N2581 Secondary hyperparathyroidism of renal origin: Secondary | ICD-10-CM | POA: Diagnosis not present

## 2015-08-29 DIAGNOSIS — E611 Iron deficiency: Secondary | ICD-10-CM | POA: Diagnosis not present

## 2015-08-29 DIAGNOSIS — Z992 Dependence on renal dialysis: Secondary | ICD-10-CM | POA: Diagnosis not present

## 2015-09-01 DIAGNOSIS — E611 Iron deficiency: Secondary | ICD-10-CM | POA: Diagnosis not present

## 2015-09-01 DIAGNOSIS — N186 End stage renal disease: Secondary | ICD-10-CM | POA: Diagnosis not present

## 2015-09-01 DIAGNOSIS — Z992 Dependence on renal dialysis: Secondary | ICD-10-CM | POA: Diagnosis not present

## 2015-09-01 DIAGNOSIS — N2581 Secondary hyperparathyroidism of renal origin: Secondary | ICD-10-CM | POA: Diagnosis not present

## 2015-09-03 DIAGNOSIS — Z992 Dependence on renal dialysis: Secondary | ICD-10-CM | POA: Diagnosis not present

## 2015-09-03 DIAGNOSIS — N186 End stage renal disease: Secondary | ICD-10-CM | POA: Diagnosis not present

## 2015-09-03 DIAGNOSIS — E611 Iron deficiency: Secondary | ICD-10-CM | POA: Diagnosis not present

## 2015-09-03 DIAGNOSIS — N2581 Secondary hyperparathyroidism of renal origin: Secondary | ICD-10-CM | POA: Diagnosis not present

## 2015-09-05 DIAGNOSIS — N186 End stage renal disease: Secondary | ICD-10-CM | POA: Diagnosis not present

## 2015-09-05 DIAGNOSIS — E611 Iron deficiency: Secondary | ICD-10-CM | POA: Diagnosis not present

## 2015-09-05 DIAGNOSIS — Z992 Dependence on renal dialysis: Secondary | ICD-10-CM | POA: Diagnosis not present

## 2015-09-05 DIAGNOSIS — N2581 Secondary hyperparathyroidism of renal origin: Secondary | ICD-10-CM | POA: Diagnosis not present

## 2015-09-08 DIAGNOSIS — E611 Iron deficiency: Secondary | ICD-10-CM | POA: Diagnosis not present

## 2015-09-08 DIAGNOSIS — N186 End stage renal disease: Secondary | ICD-10-CM | POA: Diagnosis not present

## 2015-09-08 DIAGNOSIS — Z992 Dependence on renal dialysis: Secondary | ICD-10-CM | POA: Diagnosis not present

## 2015-09-08 DIAGNOSIS — N2581 Secondary hyperparathyroidism of renal origin: Secondary | ICD-10-CM | POA: Diagnosis not present

## 2015-09-10 DIAGNOSIS — N186 End stage renal disease: Secondary | ICD-10-CM | POA: Diagnosis not present

## 2015-09-10 DIAGNOSIS — Z992 Dependence on renal dialysis: Secondary | ICD-10-CM | POA: Diagnosis not present

## 2015-09-10 DIAGNOSIS — E611 Iron deficiency: Secondary | ICD-10-CM | POA: Diagnosis not present

## 2015-09-10 DIAGNOSIS — N2581 Secondary hyperparathyroidism of renal origin: Secondary | ICD-10-CM | POA: Diagnosis not present

## 2015-09-11 NOTE — Telephone Encounter (Signed)
Pt never returned call

## 2015-09-12 DIAGNOSIS — Z992 Dependence on renal dialysis: Secondary | ICD-10-CM | POA: Diagnosis not present

## 2015-09-12 DIAGNOSIS — N2581 Secondary hyperparathyroidism of renal origin: Secondary | ICD-10-CM | POA: Diagnosis not present

## 2015-09-12 DIAGNOSIS — E611 Iron deficiency: Secondary | ICD-10-CM | POA: Diagnosis not present

## 2015-09-12 DIAGNOSIS — N186 End stage renal disease: Secondary | ICD-10-CM | POA: Diagnosis not present

## 2015-09-15 DIAGNOSIS — N186 End stage renal disease: Secondary | ICD-10-CM | POA: Diagnosis not present

## 2015-09-15 DIAGNOSIS — Z992 Dependence on renal dialysis: Secondary | ICD-10-CM | POA: Diagnosis not present

## 2015-09-15 DIAGNOSIS — E611 Iron deficiency: Secondary | ICD-10-CM | POA: Diagnosis not present

## 2015-09-15 DIAGNOSIS — N2581 Secondary hyperparathyroidism of renal origin: Secondary | ICD-10-CM | POA: Diagnosis not present

## 2015-09-16 DIAGNOSIS — N186 End stage renal disease: Secondary | ICD-10-CM | POA: Diagnosis not present

## 2015-09-16 DIAGNOSIS — Z992 Dependence on renal dialysis: Secondary | ICD-10-CM | POA: Diagnosis not present

## 2015-09-17 DIAGNOSIS — Z992 Dependence on renal dialysis: Secondary | ICD-10-CM | POA: Diagnosis not present

## 2015-09-17 DIAGNOSIS — N186 End stage renal disease: Secondary | ICD-10-CM | POA: Diagnosis not present

## 2015-09-17 DIAGNOSIS — E611 Iron deficiency: Secondary | ICD-10-CM | POA: Diagnosis not present

## 2015-09-17 DIAGNOSIS — N2581 Secondary hyperparathyroidism of renal origin: Secondary | ICD-10-CM | POA: Diagnosis not present

## 2015-09-19 DIAGNOSIS — N2581 Secondary hyperparathyroidism of renal origin: Secondary | ICD-10-CM | POA: Diagnosis not present

## 2015-09-19 DIAGNOSIS — E611 Iron deficiency: Secondary | ICD-10-CM | POA: Diagnosis not present

## 2015-09-19 DIAGNOSIS — Z992 Dependence on renal dialysis: Secondary | ICD-10-CM | POA: Diagnosis not present

## 2015-09-19 DIAGNOSIS — N186 End stage renal disease: Secondary | ICD-10-CM | POA: Diagnosis not present

## 2015-09-22 DIAGNOSIS — N2581 Secondary hyperparathyroidism of renal origin: Secondary | ICD-10-CM | POA: Diagnosis not present

## 2015-09-22 DIAGNOSIS — E611 Iron deficiency: Secondary | ICD-10-CM | POA: Diagnosis not present

## 2015-09-22 DIAGNOSIS — N186 End stage renal disease: Secondary | ICD-10-CM | POA: Diagnosis not present

## 2015-09-22 DIAGNOSIS — Z992 Dependence on renal dialysis: Secondary | ICD-10-CM | POA: Diagnosis not present

## 2015-09-24 DIAGNOSIS — N186 End stage renal disease: Secondary | ICD-10-CM | POA: Diagnosis not present

## 2015-09-24 DIAGNOSIS — Z992 Dependence on renal dialysis: Secondary | ICD-10-CM | POA: Diagnosis not present

## 2015-09-24 DIAGNOSIS — E611 Iron deficiency: Secondary | ICD-10-CM | POA: Diagnosis not present

## 2015-09-24 DIAGNOSIS — N2581 Secondary hyperparathyroidism of renal origin: Secondary | ICD-10-CM | POA: Diagnosis not present

## 2015-09-26 DIAGNOSIS — N2581 Secondary hyperparathyroidism of renal origin: Secondary | ICD-10-CM | POA: Diagnosis not present

## 2015-09-26 DIAGNOSIS — E611 Iron deficiency: Secondary | ICD-10-CM | POA: Diagnosis not present

## 2015-09-26 DIAGNOSIS — N186 End stage renal disease: Secondary | ICD-10-CM | POA: Diagnosis not present

## 2015-09-26 DIAGNOSIS — Z992 Dependence on renal dialysis: Secondary | ICD-10-CM | POA: Diagnosis not present

## 2015-09-28 DIAGNOSIS — N186 End stage renal disease: Secondary | ICD-10-CM | POA: Diagnosis not present

## 2015-09-28 DIAGNOSIS — E785 Hyperlipidemia, unspecified: Secondary | ICD-10-CM | POA: Diagnosis not present

## 2015-09-28 DIAGNOSIS — Z992 Dependence on renal dialysis: Secondary | ICD-10-CM | POA: Diagnosis not present

## 2015-09-28 DIAGNOSIS — Y841 Kidney dialysis as the cause of abnormal reaction of the patient, or of later complication, without mention of misadventure at the time of the procedure: Secondary | ICD-10-CM | POA: Diagnosis not present

## 2015-09-28 DIAGNOSIS — T82868A Thrombosis of vascular prosthetic devices, implants and grafts, initial encounter: Secondary | ICD-10-CM | POA: Diagnosis not present

## 2015-09-28 DIAGNOSIS — T82318A Breakdown (mechanical) of other vascular grafts, initial encounter: Secondary | ICD-10-CM | POA: Diagnosis not present

## 2015-09-28 DIAGNOSIS — E669 Obesity, unspecified: Secondary | ICD-10-CM | POA: Diagnosis not present

## 2015-09-28 DIAGNOSIS — I10 Essential (primary) hypertension: Secondary | ICD-10-CM | POA: Diagnosis not present

## 2015-09-29 DIAGNOSIS — E611 Iron deficiency: Secondary | ICD-10-CM | POA: Diagnosis not present

## 2015-09-29 DIAGNOSIS — N2581 Secondary hyperparathyroidism of renal origin: Secondary | ICD-10-CM | POA: Diagnosis not present

## 2015-09-29 DIAGNOSIS — N186 End stage renal disease: Secondary | ICD-10-CM | POA: Diagnosis not present

## 2015-09-29 DIAGNOSIS — Z992 Dependence on renal dialysis: Secondary | ICD-10-CM | POA: Diagnosis not present

## 2015-10-01 DIAGNOSIS — E611 Iron deficiency: Secondary | ICD-10-CM | POA: Diagnosis not present

## 2015-10-01 DIAGNOSIS — Z992 Dependence on renal dialysis: Secondary | ICD-10-CM | POA: Diagnosis not present

## 2015-10-01 DIAGNOSIS — N186 End stage renal disease: Secondary | ICD-10-CM | POA: Diagnosis not present

## 2015-10-01 DIAGNOSIS — N2581 Secondary hyperparathyroidism of renal origin: Secondary | ICD-10-CM | POA: Diagnosis not present

## 2015-10-03 DIAGNOSIS — Z992 Dependence on renal dialysis: Secondary | ICD-10-CM | POA: Diagnosis not present

## 2015-10-03 DIAGNOSIS — N2581 Secondary hyperparathyroidism of renal origin: Secondary | ICD-10-CM | POA: Diagnosis not present

## 2015-10-03 DIAGNOSIS — N186 End stage renal disease: Secondary | ICD-10-CM | POA: Diagnosis not present

## 2015-10-03 DIAGNOSIS — E611 Iron deficiency: Secondary | ICD-10-CM | POA: Diagnosis not present

## 2015-10-06 DIAGNOSIS — N186 End stage renal disease: Secondary | ICD-10-CM | POA: Diagnosis not present

## 2015-10-06 DIAGNOSIS — N2581 Secondary hyperparathyroidism of renal origin: Secondary | ICD-10-CM | POA: Diagnosis not present

## 2015-10-06 DIAGNOSIS — Z992 Dependence on renal dialysis: Secondary | ICD-10-CM | POA: Diagnosis not present

## 2015-10-06 DIAGNOSIS — E611 Iron deficiency: Secondary | ICD-10-CM | POA: Diagnosis not present

## 2015-10-08 DIAGNOSIS — E611 Iron deficiency: Secondary | ICD-10-CM | POA: Diagnosis not present

## 2015-10-08 DIAGNOSIS — N186 End stage renal disease: Secondary | ICD-10-CM | POA: Diagnosis not present

## 2015-10-08 DIAGNOSIS — N2581 Secondary hyperparathyroidism of renal origin: Secondary | ICD-10-CM | POA: Diagnosis not present

## 2015-10-08 DIAGNOSIS — Z992 Dependence on renal dialysis: Secondary | ICD-10-CM | POA: Diagnosis not present

## 2015-10-10 DIAGNOSIS — E611 Iron deficiency: Secondary | ICD-10-CM | POA: Diagnosis not present

## 2015-10-10 DIAGNOSIS — N2581 Secondary hyperparathyroidism of renal origin: Secondary | ICD-10-CM | POA: Diagnosis not present

## 2015-10-10 DIAGNOSIS — Z992 Dependence on renal dialysis: Secondary | ICD-10-CM | POA: Diagnosis not present

## 2015-10-10 DIAGNOSIS — N186 End stage renal disease: Secondary | ICD-10-CM | POA: Diagnosis not present

## 2015-10-13 DIAGNOSIS — Z992 Dependence on renal dialysis: Secondary | ICD-10-CM | POA: Diagnosis not present

## 2015-10-13 DIAGNOSIS — N2581 Secondary hyperparathyroidism of renal origin: Secondary | ICD-10-CM | POA: Diagnosis not present

## 2015-10-13 DIAGNOSIS — N186 End stage renal disease: Secondary | ICD-10-CM | POA: Diagnosis not present

## 2015-10-13 DIAGNOSIS — E611 Iron deficiency: Secondary | ICD-10-CM | POA: Diagnosis not present

## 2015-10-15 DIAGNOSIS — E611 Iron deficiency: Secondary | ICD-10-CM | POA: Diagnosis not present

## 2015-10-15 DIAGNOSIS — N2581 Secondary hyperparathyroidism of renal origin: Secondary | ICD-10-CM | POA: Diagnosis not present

## 2015-10-15 DIAGNOSIS — Z992 Dependence on renal dialysis: Secondary | ICD-10-CM | POA: Diagnosis not present

## 2015-10-15 DIAGNOSIS — N186 End stage renal disease: Secondary | ICD-10-CM | POA: Diagnosis not present

## 2015-10-16 DIAGNOSIS — N186 End stage renal disease: Secondary | ICD-10-CM | POA: Diagnosis not present

## 2015-10-16 DIAGNOSIS — Z992 Dependence on renal dialysis: Secondary | ICD-10-CM | POA: Diagnosis not present

## 2015-10-17 DIAGNOSIS — N186 End stage renal disease: Secondary | ICD-10-CM | POA: Diagnosis not present

## 2015-10-17 DIAGNOSIS — N2581 Secondary hyperparathyroidism of renal origin: Secondary | ICD-10-CM | POA: Diagnosis not present

## 2015-10-17 DIAGNOSIS — D509 Iron deficiency anemia, unspecified: Secondary | ICD-10-CM | POA: Diagnosis not present

## 2015-10-17 DIAGNOSIS — Z992 Dependence on renal dialysis: Secondary | ICD-10-CM | POA: Diagnosis not present

## 2015-10-20 DIAGNOSIS — Z992 Dependence on renal dialysis: Secondary | ICD-10-CM | POA: Diagnosis not present

## 2015-10-20 DIAGNOSIS — N2581 Secondary hyperparathyroidism of renal origin: Secondary | ICD-10-CM | POA: Diagnosis not present

## 2015-10-20 DIAGNOSIS — D509 Iron deficiency anemia, unspecified: Secondary | ICD-10-CM | POA: Diagnosis not present

## 2015-10-20 DIAGNOSIS — N186 End stage renal disease: Secondary | ICD-10-CM | POA: Diagnosis not present

## 2015-10-22 DIAGNOSIS — N186 End stage renal disease: Secondary | ICD-10-CM | POA: Diagnosis not present

## 2015-10-22 DIAGNOSIS — Z992 Dependence on renal dialysis: Secondary | ICD-10-CM | POA: Diagnosis not present

## 2015-10-22 DIAGNOSIS — D509 Iron deficiency anemia, unspecified: Secondary | ICD-10-CM | POA: Diagnosis not present

## 2015-10-22 DIAGNOSIS — N2581 Secondary hyperparathyroidism of renal origin: Secondary | ICD-10-CM | POA: Diagnosis not present

## 2015-10-24 DIAGNOSIS — N2581 Secondary hyperparathyroidism of renal origin: Secondary | ICD-10-CM | POA: Diagnosis not present

## 2015-10-24 DIAGNOSIS — N186 End stage renal disease: Secondary | ICD-10-CM | POA: Diagnosis not present

## 2015-10-24 DIAGNOSIS — D509 Iron deficiency anemia, unspecified: Secondary | ICD-10-CM | POA: Diagnosis not present

## 2015-10-24 DIAGNOSIS — Z992 Dependence on renal dialysis: Secondary | ICD-10-CM | POA: Diagnosis not present

## 2015-10-27 DIAGNOSIS — N2581 Secondary hyperparathyroidism of renal origin: Secondary | ICD-10-CM | POA: Diagnosis not present

## 2015-10-27 DIAGNOSIS — D509 Iron deficiency anemia, unspecified: Secondary | ICD-10-CM | POA: Diagnosis not present

## 2015-10-27 DIAGNOSIS — N186 End stage renal disease: Secondary | ICD-10-CM | POA: Diagnosis not present

## 2015-10-27 DIAGNOSIS — Z992 Dependence on renal dialysis: Secondary | ICD-10-CM | POA: Diagnosis not present

## 2015-10-29 DIAGNOSIS — Z992 Dependence on renal dialysis: Secondary | ICD-10-CM | POA: Diagnosis not present

## 2015-10-29 DIAGNOSIS — N2581 Secondary hyperparathyroidism of renal origin: Secondary | ICD-10-CM | POA: Diagnosis not present

## 2015-10-29 DIAGNOSIS — N186 End stage renal disease: Secondary | ICD-10-CM | POA: Diagnosis not present

## 2015-10-29 DIAGNOSIS — D509 Iron deficiency anemia, unspecified: Secondary | ICD-10-CM | POA: Diagnosis not present

## 2015-10-31 DIAGNOSIS — Z992 Dependence on renal dialysis: Secondary | ICD-10-CM | POA: Diagnosis not present

## 2015-10-31 DIAGNOSIS — D509 Iron deficiency anemia, unspecified: Secondary | ICD-10-CM | POA: Diagnosis not present

## 2015-10-31 DIAGNOSIS — N2581 Secondary hyperparathyroidism of renal origin: Secondary | ICD-10-CM | POA: Diagnosis not present

## 2015-10-31 DIAGNOSIS — N186 End stage renal disease: Secondary | ICD-10-CM | POA: Diagnosis not present

## 2015-11-03 ENCOUNTER — Other Ambulatory Visit: Payer: Self-pay | Admitting: Vascular Surgery

## 2015-11-03 DIAGNOSIS — D509 Iron deficiency anemia, unspecified: Secondary | ICD-10-CM | POA: Diagnosis not present

## 2015-11-03 DIAGNOSIS — N186 End stage renal disease: Secondary | ICD-10-CM | POA: Diagnosis not present

## 2015-11-03 DIAGNOSIS — Z992 Dependence on renal dialysis: Secondary | ICD-10-CM | POA: Diagnosis not present

## 2015-11-03 DIAGNOSIS — N2581 Secondary hyperparathyroidism of renal origin: Secondary | ICD-10-CM | POA: Diagnosis not present

## 2015-11-04 ENCOUNTER — Encounter: Payer: Self-pay | Admitting: Anesthesiology

## 2015-11-04 ENCOUNTER — Ambulatory Visit
Admission: RE | Admit: 2015-11-04 | Discharge: 2015-11-04 | Disposition: A | Discharge status: Home / Self Care | Payer: Medicare Other | Source: Ambulatory Visit | Attending: Vascular Surgery | Admitting: Vascular Surgery

## 2015-11-04 ENCOUNTER — Encounter
Admission: RE | Disposition: A | Discharge status: Home / Self Care | Payer: Self-pay | Source: Ambulatory Visit | Attending: Vascular Surgery

## 2015-11-04 DIAGNOSIS — Z87891 Personal history of nicotine dependence: Secondary | ICD-10-CM | POA: Diagnosis not present

## 2015-11-04 DIAGNOSIS — I509 Heart failure, unspecified: Secondary | ICD-10-CM | POA: Diagnosis not present

## 2015-11-04 DIAGNOSIS — I951 Orthostatic hypotension: Secondary | ICD-10-CM | POA: Diagnosis not present

## 2015-11-04 DIAGNOSIS — T82318A Breakdown (mechanical) of other vascular grafts, initial encounter: Secondary | ICD-10-CM | POA: Diagnosis not present

## 2015-11-04 DIAGNOSIS — T82868A Thrombosis of vascular prosthetic devices, implants and grafts, initial encounter: Secondary | ICD-10-CM | POA: Diagnosis not present

## 2015-11-04 DIAGNOSIS — Z452 Encounter for adjustment and management of vascular access device: Secondary | ICD-10-CM | POA: Insufficient documentation

## 2015-11-04 DIAGNOSIS — E669 Obesity, unspecified: Secondary | ICD-10-CM | POA: Diagnosis not present

## 2015-11-04 DIAGNOSIS — E663 Overweight: Secondary | ICD-10-CM | POA: Insufficient documentation

## 2015-11-04 DIAGNOSIS — Z992 Dependence on renal dialysis: Secondary | ICD-10-CM | POA: Insufficient documentation

## 2015-11-04 DIAGNOSIS — M79606 Pain in leg, unspecified: Secondary | ICD-10-CM | POA: Insufficient documentation

## 2015-11-04 DIAGNOSIS — Z8249 Family history of ischemic heart disease and other diseases of the circulatory system: Secondary | ICD-10-CM | POA: Insufficient documentation

## 2015-11-04 DIAGNOSIS — N186 End stage renal disease: Secondary | ICD-10-CM | POA: Diagnosis not present

## 2015-11-04 DIAGNOSIS — E785 Hyperlipidemia, unspecified: Secondary | ICD-10-CM | POA: Diagnosis not present

## 2015-11-04 DIAGNOSIS — I729 Aneurysm of unspecified site: Secondary | ICD-10-CM | POA: Insufficient documentation

## 2015-11-04 DIAGNOSIS — Y841 Kidney dialysis as the cause of abnormal reaction of the patient, or of later complication, without mention of misadventure at the time of the procedure: Secondary | ICD-10-CM | POA: Diagnosis not present

## 2015-11-04 DIAGNOSIS — R000 Tachycardia, unspecified: Secondary | ICD-10-CM | POA: Diagnosis not present

## 2015-11-04 DIAGNOSIS — I132 Hypertensive heart and chronic kidney disease with heart failure and with stage 5 chronic kidney disease, or end stage renal disease: Secondary | ICD-10-CM | POA: Diagnosis not present

## 2015-11-04 DIAGNOSIS — I10 Essential (primary) hypertension: Secondary | ICD-10-CM | POA: Diagnosis not present

## 2015-11-04 HISTORY — PX: PERIPHERAL VASCULAR CATHETERIZATION: SHX172C

## 2015-11-04 LAB — POTASSIUM (ARMC VASCULAR LAB ONLY): Potassium (ARMC vascular lab): 5 (ref 3.5–5.1)

## 2015-11-04 SURGERY — DIALYSIS/PERMA CATHETER INSERTION
Anesthesia: Moderate Sedation

## 2015-11-04 MED ORDER — MIDAZOLAM HCL 2 MG/2ML IJ SOLN
INTRAMUSCULAR | Status: DC | PRN
  Administered 2015-11-04: 2 mg via INTRAVENOUS

## 2015-11-04 MED ORDER — LIDOCAINE-EPINEPHRINE (PF) 1 %-1:200000 IJ SOLN
INTRAMUSCULAR | Status: AC
  Filled 2015-11-04: qty 30

## 2015-11-04 MED ORDER — FAMOTIDINE 20 MG PO TABS: 40.0000 mg | ORAL_TABLET | ORAL | Status: DC | PRN

## 2015-11-04 MED ORDER — HYDROMORPHONE HCL 1 MG/ML IJ SOLN: 1.0000 mg | Freq: Once | INTRAMUSCULAR | Status: DC

## 2015-11-04 MED ORDER — METHYLPREDNISOLONE SODIUM SUCC 125 MG IJ SOLR: 125.0000 mg | INTRAMUSCULAR | Status: DC | PRN

## 2015-11-04 MED ORDER — HEPARIN SODIUM (PORCINE) 10000 UNIT/ML IJ SOLN
INTRAMUSCULAR | Status: AC
  Filled 2015-11-04: qty 1

## 2015-11-04 MED ORDER — DEXTROSE 5 % IV SOLN
1.5000 g | INTRAVENOUS | Status: AC
  Administered 2015-11-04: 1.5 g via INTRAVENOUS

## 2015-11-04 MED ORDER — LIDOCAINE-EPINEPHRINE (PF) 1 %-1:200000 IJ SOLN
INTRAMUSCULAR | Status: DC | PRN
  Administered 2015-11-04: 10 mL via INTRADERMAL

## 2015-11-04 MED ORDER — MIDAZOLAM HCL 5 MG/5ML IJ SOLN
INTRAMUSCULAR | Status: AC
  Filled 2015-11-04: qty 5

## 2015-11-04 MED ORDER — ONDANSETRON HCL 4 MG/2ML IJ SOLN: 4.0000 mg | Freq: Four times a day (QID) | INTRAMUSCULAR | Status: DC | PRN

## 2015-11-04 MED ORDER — FENTANYL CITRATE (PF) 100 MCG/2ML IJ SOLN
INTRAMUSCULAR | Status: DC | PRN
  Administered 2015-11-04: 50 ug via INTRAVENOUS

## 2015-11-04 MED ORDER — FENTANYL CITRATE (PF) 100 MCG/2ML IJ SOLN
INTRAMUSCULAR | Status: AC
Start: 2015-11-04 — End: 2015-11-04
  Filled 2015-11-04: qty 2

## 2015-11-04 MED ORDER — HEPARIN (PORCINE) IN NACL 2-0.9 UNIT/ML-% IJ SOLN
INTRAMUSCULAR | Status: AC
  Filled 2015-11-04: qty 500

## 2015-11-04 MED ORDER — SODIUM CHLORIDE 0.9 % IV SOLN
INTRAVENOUS | Status: DC
  Administered 2015-11-04: 11:00:00 via INTRAVENOUS

## 2015-11-04 SURGICAL SUPPLY — 10 items
BIOPATCH RED 1 DISK 7.0 (GAUZE/BANDAGES/DRESSINGS) ×2 IMPLANT
BIOPATCH RED 1IN DISK 7.0MM (GAUZE/BANDAGES/DRESSINGS) ×1
CATH PALINDROME RT-P 15FX23CM (CATHETERS) ×3 IMPLANT
PACK ANGIOGRAPHY (CUSTOM PROCEDURE TRAY) ×3 IMPLANT
PREP CHG 10.5 TEAL (MISCELLANEOUS) ×3 IMPLANT
STOPCOCK PLUG RED CAP (CAP) ×6 IMPLANT
SUT MON AB 4-0 PC3 18 (SUTURE) ×3 IMPLANT
SUT PROLENE 0 CT 1 30 (SUTURE) ×3 IMPLANT
TOWEL OR 17X26 4PK STRL BLUE (TOWEL DISPOSABLE) ×3 IMPLANT
WIRE MAGIC TOR.035 180C (WIRE) ×3 IMPLANT

## 2015-11-04 NOTE — H&P (Signed)
[facility] Admission History & Physical  MRN : [id number]  [name] [name] is a 50 y.o. (1965/07/10) male who presents with chief complaint of No chief complaint on file. [name]  History of Present Illness: Patient sent from his access center for a non-functional catheter.  Had a HeRO graft that was unable to be salvaged at last attempt, and a catheter was placed.  This has now become non-functional, and needs exchange.  No chest pain or shortness of breath. No other complaints today and feels well.    Current Facility-Administered Medications  Medication Dose Route Frequency Provider Last Rate Last Dose  . 0.9 %  sodium chloride infusion   Intravenous Continuous Kimberly A Stegmayer, PA-C      . cefUROXime (ZINACEF) 1.5 g in dextrose 5 % 50 mL IVPB  1.5 g Intravenous 30 min Pre-Op Kimberly A Stegmayer, PA-C      . famotidine (PEPCID) tablet 40 mg  40 mg Oral PRN Janalyn Harder Stegmayer, PA-C      . HYDROmorphone (DILAUDID) injection 1 mg  1 mg Intravenous Once [employer], PA-C      . methylPREDNISolone sodium succinate (SOLU-MEDROL) 125 mg/2 mL injection 125 mg  125 mg Intravenous PRN Kimberly A Stegmayer, PA-C      . ondansetron (ZOFRAN) injection 4 mg  4 mg Intravenous Q6H PRN Sela Hua, PA-C        Past Medical History  Diagnosis Date  . Hypertension   . Orthostatic hypotension      2008  . Chest discomfort      2008  . Sinus tachycardia ([place])      2008, TSH normal  . Ejection fraction      65%, echo, 2008   /   EF 55-60%, echo, October 28, 2010  . Overweight(278.02)   . Syncope      positional after dialysis... 2008  . Aneurysm ([place])      Right arm fistula 3 aneurysms 2011,   plans to have a new procedure  . CHF (congestive heart failure) ([place])   . Leg pain   . Angina     occasional, last 6 mo ago  . Dialysis patient [facility])     M-W-F @ [place]  . ESRD (end stage renal disease) ([place])     on hemodialysis M-W-F [place]    Past Surgical  History  Procedure Laterality Date  . Right arm graft      for dyalisis  . Umbilical hernia repair    . Thrombectomy    . Arteriovenous graft placement    . Av fistula placement    . Thrombectomy w/ embolectomy  03/01/2011    Procedure: THROMBECTOMY ARTERIOVENOUS GORE-TEX GRAFT;  Surgeon: Elam Dutch, MD;  Location: [place];  Service: Vascular;  Laterality: Right;  Attempted Thrombectomy of Old  Right Upper Arm Arteriovenous gortex Graft. Insertion of new Arteriovenous Graft using 47mm x 50cm Gortex Stretch graft.   . Insertion of dialysis catheter  03/01/2011    Procedure: INSERTION OF DIALYSIS CATHETER;  Surgeon: Elam Dutch, MD;  Location: [place];  Service: Vascular;  Laterality: Left;  Exchange of Dialysis Catheter to 27cm 15Fr. Arrow Catheter  . Thrombectomy w/ embolectomy  07/11/2011    Procedure: THROMBECTOMY ARTERIOVENOUS GORE-TEX GRAFT;  Surgeon: Rosetta Posner, MD;  Location: [place];  Service: Vascular;  Laterality: Right;  [name] N/A 08/23/2011    Procedure: VENOGRAM;  Surgeon: [name], MD;  Location: [place] C[facility];  Service: Cardiovascular;  Laterality: N/A;  . Peripheral vascular catheterization N/A 09/01/2014    Procedure: A/V Shuntogram/Fistulagram;  Surgeon: Algernon Huxley, MD;  Location: [place] [facility];  Service: Cardiovascular;  Laterality: N/A;  . Peripheral vascular catheterization Right 09/01/2014    Procedure: Thrombectomy;  Surgeon: Algernon Huxley, MD;  Location: [place] [facility];  Service: Cardiovascular;  Laterality: Right;  . Peripheral vascular catheterization Right 09/01/2014    Procedure: A/V Shunt Intervention;  Surgeon: Algernon Huxley, MD;  Location: [place] [facility];  Service: Cardiovascular;  Laterality: Right;  . Peripheral vascular catheterization N/A 09/17/2014    Procedure: A/V Shuntogram/Fistulagram;  Surgeon: Algernon Huxley, MD;  Location: [place] [facility];  Service: Cardiovascular;  Laterality: N/A;  . Peripheral vascular catheterization  Right 10/17/2014    Procedure: Thrombectomy;  Surgeon: Katha Cabal, MD;  Location: [place] [facility];  Service: Cardiovascular;  Laterality: Right;  . Peripheral vascular catheterization N/A 10/17/2014    Procedure: A/V Shuntogram/Fistulagram;  Surgeon: Katha Cabal, MD;  Location: [place] [facility];  Service: Cardiovascular;  Laterality: N/A;  . Peripheral vascular catheterization N/A 10/17/2014    Procedure: A/V Shunt Intervention;  Surgeon: Katha Cabal, MD;  Location: [place] [facility];  Service: Cardiovascular;  Laterality: N/A;  . Peripheral vascular catheterization Right 11/04/2014    Procedure: Thrombectomy;  Surgeon: Katha Cabal, MD;  Location: [place] [facility];  Service: Cardiovascular;  Laterality: Right;  . Peripheral vascular catheterization Left 11/04/2014    Procedure: Visceral Venography;  Surgeon: Katha Cabal, MD;  Location: [place] [facility];  Service: Cardiovascular;  Laterality: Left;  . Peripheral vascular catheterization Right 11/27/2014    Procedure: A/V Shuntogram/Fistulagram;  Surgeon: Algernon Huxley, MD;  Location: [place] [facility];  Service: Cardiovascular;  Laterality: Right;  . Peripheral vascular catheterization Right 11/27/2014    Procedure: A/V Shunt Intervention;  Surgeon: Algernon Huxley, MD;  Location: [place] [facility];  Service: Cardiovascular;  Laterality: Right;  . Peripheral vascular catheterization Right 12/31/2014    Procedure: Thrombectomy;  Surgeon: Algernon Huxley, MD;  Location: [place] [facility];  Service: Cardiovascular;  Laterality: Right;  . Peripheral vascular catheterization Right 01/12/2015    Procedure: A/V Shuntogram/Fistulagram;  Surgeon: Algernon Huxley, MD;  Location: [place] [facility];  Service: Cardiovascular;  Laterality: Right;  . Peripheral vascular catheterization N/A 01/12/2015    Procedure: A/V Shunt Intervention;  Surgeon: Algernon Huxley, MD;  Location: [place] [facility];  Service: Cardiovascular;   Laterality: N/A;  . Peripheral vascular catheterization Right 01/28/2015    Procedure: Thrombectomy;  Surgeon: Algernon Huxley, MD;  Location: [place] [facility];  Service: Cardiovascular;  Laterality: Right;  . Peripheral vascular catheterization N/A 06/08/2015    Procedure: A/V Shuntogram/Fistulagram;  Surgeon: Algernon Huxley, MD;  Location: [place] [facility];  Service: Cardiovascular;  Laterality: N/A;  . Peripheral vascular catheterization N/A 06/08/2015    Procedure: A/V Shunt Intervention;  Surgeon: Algernon Huxley, MD;  Location: [place] [facility];  Service: Cardiovascular;  Laterality: N/A;  . Peripheral vascular catheterization Right 07/06/2015    Procedure: A/V Shuntogram/Fistulagram;  Surgeon: Algernon Huxley, MD;  Location: [place] [facility];  Service: Cardiovascular;  Laterality: Right;  . Peripheral vascular catheterization N/A 07/06/2015    Procedure: A/V Shunt Intervention;  Surgeon: Algernon Huxley, MD;  Location: [place] [facility];  Service: Cardiovascular;  Laterality: N/A;  . Peripheral vascular catheterization N/A 08/04/2015    Procedure: graft declot;  Surgeon: Katha Cabal, MD;  Location: [place] [facility];  Service: Cardiovascular;  Laterality: N/A;  . Peripheral vascular catheterization Right 08/25/2015    Procedure: A/V Shuntogram/Fistulagram;  Surgeon: Katha Cabal, MD;  Location: [place] [facility];  Service: Cardiovascular;  Laterality: Right;    Social History Social History  Substance Use Topics  . Smoking status: Former Smoker    Types: Cigarettes    Quit date: 04/18/1994  . Smokeless tobacco: Current User    Types: Snuff     Comment: dips 1/2 snuff per day times 25 years  . Alcohol Use: No    Family History Family History  Problem Relation Age of Onset  . Hypertension Mother   . Heart disease Mother   . Hypertension Father   . Heart disease Father   no bleeding or clotting disorders  No Known Allergies   REVIEW OF SYSTEMS (Negative unless  checked)  Constitutional: [] Weight loss  [] Fever  [] Chills Cardiac: [] Chest pain   [] Chest pressure   [] Palpitations   [] Shortness of breath when laying flat   [] Shortness of breath at rest   [] Shortness of breath with exertion. Vascular:  [] Pain in legs with walking   [] Pain in legs at rest   [] Pain in legs when laying flat   [] Claudication   [] Pain in feet when walking  [] Pain in feet at rest  [] Pain in feet when laying flat   [] History of DVT   [] Phlebitis   [] Swelling in legs   [] Varicose veins   [] Non-healing ulcers Pulmonary:   [] Uses home oxygen   [] Productive cough   [] Hemoptysis   [] Wheeze  [] COPD   [] Asthma Neurologic:  [] Dizziness  [] Blackouts   [] Seizures   [] History of stroke   [] History of TIA  [] Aphasia   [] Temporary blindness   [] Dysphagia   [] Weakness or numbness in arms   [] Weakness or numbness in legs Musculoskeletal:  [] Arthritis   [] Joint swelling   [] Joint pain   [] Low back pain Hematologic:  [] Easy bruising  [] Easy bleeding   [] Hypercoagulable state   [] Anemic  [] Hepatitis Gastrointestinal:  [] Blood in stool   [] Vomiting blood  [] Gastroesophageal reflux/heartburn   [] Difficulty swallowing. Genitourinary:  [x] Chronic kidney disease   [] Difficult urination  [] Frequent urination  [] Burning with urination   [] Blood in urine Skin:  [] Rashes   [] Ulcers   [] Wounds Psychological:  [] History of anxiety   []  History of major depression.  Physical Examination  There were no vitals filed for this visit. There is no weight on file to calculate BMI. Gen: WD/WN, NAD, obese Head: [place]/AT, No temporalis wasting. Prominent temp pulse not noted. Ear/Nose/Throat: Hearing grossly intact, nares w/o erythema or drainage, oropharynx w/o Erythema/Exudate,  Eyes: PERRLA, EOMI.  Neck: Supple, no nuchal rigidity.  No bruit or JVD.  Pulmonary:  Good air movement, equal bilaterally, no use of accessory muscles.  Cardiac: RRR, normal S1, S2 Vascular: right IJ permcath in place, no erythema or  drainage Vessel Right Left  Radial Palpable Palpable                                   Gastrointestinal: soft, non-tender/non-distended. No guarding/reflex.  Musculoskeletal: M/S 5/5 throughout.  Extremities without ischemic changes.  No deformity or atrophy.  Neurologic: CN 2-12 intact. Pain and light touch intact in extremities.  Symmetrical.  Speech is fluent. Motor exam as listed above. Psychiatric: Judgment intact, Mood & affect appropriate for pt's clinical situation. Dermatologic: No rashes or ulcers noted.  No cellulitis or open wounds. Lymph : No Cervical, Axillary, or Inguinal lymphadenopathy.     CBC Lab Results  Component Value Date   WBC 7.5 11/13/2013   HGB 11.3* 11/13/2013   HCT 36.2* 11/13/2013   MCV 77* 11/13/2013   PLT 211 11/13/2013    BMET    Component Value Date/Time   NA 137 11/13/2013 0408   NA 136 04/06/2012 1426   K 4.1 11/13/2013 0408   K 5.0 08/08/2012 1430   CL 98 11/13/2013 0408   CL 92* 04/06/2012 1426   CO2 25 11/13/2013 0408   CO2 22 04/06/2012 1426   GLUCOSE 64* 11/13/2013 0408   GLUCOSE 87 04/06/2012 1426   BUN 68* 11/13/2013 0408   BUN 78* 04/06/2012 1426   CREATININE 16.45* 11/13/2013 0408   CREATININE 17.82* 04/06/2012 1426   CALCIUM 6.8* 11/13/2013 0408   CALCIUM 8.6 04/06/2012 1426   GFRNONAA 3* 11/13/2013 0408   GFRNONAA 3* 04/06/2012 1426   GFRAA 3* 11/13/2013 0408   GFRAA 3* 04/06/2012 1426   CrCl cannot be calculated (Unknown ideal weight.).  COAG Lab Results  Component Value Date   INR 1.05 08/08/2012   INR 0.98 06/01/2011   INR 1.04 03/01/2011    Radiology No results found.    Assessment/Plan 1. Dysfunction of dialysis access.  Will exchange permcath today.  Risks and benefits discussed 2. ESRD. Needs access. 3. Orthostatic hypotension. May have contributed to multiple thrombosis of HeRO graft previously.     DEW,JASON, MD  11/04/2015 10:27 AM

## 2015-11-04 NOTE — Discharge Instructions (Signed)
, Care After °Refer to this sheet in the next few weeks. These instructions provide you with information on caring for yourself after your procedure. Your caregiver may also give you more specific instructions. Your treatment has been planned according to current medical practices, but problems sometimes occur. Call your caregiver if you have any problems or questions after your procedure.  °HOME CARE INSTRUCTIONS °· Rest at home the day of the procedure. You will likely be able to return to normal activities the following day. °· Follow your caregiver's specific instructions for the type of device that you have. °· Only take over-the-counter or prescription medicines as directed by your caregiver. °· Keep the insertion site of the catheter clean and dry at all times. °¨ Change the bandages (dressings) over the catheter site as directed by your caregiver. °¨ Wash the area around the catheter site during each dressing change. Sponge bathe the area using a germ-killing (antiseptic) solution as directed by your caregiver. °¨ Look for redness or swelling at the insertion site during each dressing change. °· Apply an antibiotic ointment as directed by your caregiver. °· Flush your catheter as directed to keep it from becoming clogged. °· Always wash your hands thoroughly before changing dressings or flushing the catheter. °· Do not let air enter the catheter. °¨ Never open the cap at the catheter tip. °¨ Always make sure there is no air in the syringe or in the tubing for infusions.    °· Do not lift anything heavy. °· Do not drive until your caregiver approves. °· Do not shower or bathe until your caregiver approves. When you shower or bathe, place a piece of plastic wrap over the catheter site. Do not allow the catheter site or the dressing to get wet. If taking a bath, do not allow the catheter to get submerged in the water. °If the catheter was inserted through an arm vein:  °· Avoid wearing tight clothes or jewelry  on the arm that has the catheter.   °· Do not sleep with your head on the arm that has the catheter.   °· Do not allow use of a blood pressure cuff on the arm that has the catheter.   °· Do not let anyone draw blood from the arm that has the catheter, except through the catheter itself. °SEEK MEDICAL CARE IF: °· You have bleeding at the insertion site of the catheter.   °· You feel weak or nauseous.   °· Your catheter is not working properly.   °· You have redness, pain, swelling, and warmth at the insertion site.   °· You notice fluid draining from the insertion site.   °SEEK IMMEDIATE MEDICAL CARE IF: °· Your catheter breaks or has a hole in it.   °· Your catheter comes loose or gets pulled completely out. If this happens, hold firm pressure over the area with your hand or a clean cloth.   °· You have a fever. °· You have chills.   °· Your catheter becomes totally blocked.   °· You have swelling in your arm, shoulder, neck, or face.   °· You have bleeding from the insertion site that does not stop.   °· You develop chest pain or have trouble breathing.   °· You feel dizzy or faint.   °MAKE SURE YOU: °· Understand these instructions. °· Will watch your condition. °· Will get help right away if you are not doing well or get worse. °  °This information is not intended to replace advice given to you by your health care provider. Make sure you discuss any questions you   have with your health care provider. °  °Document Released: 03/21/2012 Document Revised: 12/05/2012 Document Reviewed: 03/21/2012 °[employer] Interactive Patient Education ©2016 [employer] ° °

## 2015-11-04 NOTE — Op Note (Signed)
OPERATIVE NOTE    PRE-OPERATIVE DIAGNOSIS: 1. ESRD 2. Non-functional permcath  POST-OPERATIVE DIAGNOSIS: same as above  PROCEDURE: 1. Fluoroscopic guidance for placement of catheter 2. Placement of a 23 cm tip to cuff tunneled hemodialysis catheter via the right internal jugular vein and removal of previous catheter  SURGEON: Josecarlos Harriott, MD  ANESTHESIA:  Local/MCS  ESTIMATED BLOOD LOSS: minimal  FINDING(S): none  SPECIMEN(S):  None  INDICATIONS:   Patient is a 50 y.o.male who presents with non-functional dialysis catheter and ESRD.  The patient needs long term dialysis access for their ESRD, and a Permcath is necessary.  Risks and benefits are discussed and informed consent is obtained.    DESCRIPTION: After obtaining full informed written consent, the patient was brought back to the vascular suited. The patient's existing catheter, neck and chest were sterilely prepped and draped in a sterile surgical field was created.  The existing catheter was dissected free from the fibrous sheath securing the cuff with hemostats and blunt dissection.  A wire was placed. The existing catheter was then removed and the wire used to keep venous access. I selected a Heparin coated 23 cm tip to cuff tunneled dialysis catheter.  Using fluoroscopic guidance the catheter tips were parked in the right atrium. The appropriate distal connectors were placed. It withdrew blood well and flushed easily with heparinized saline and a concentrated heparin solution was then placed. It was secured to the chest wall with 2 Prolene sutures. A 4-0 Monocryl pursestring suture was placed around the exit site. Sterile dressings were placed. The patient tolerated the procedure well and was taken to the recovery room in stable condition.  COMPLICATIONS: None  CONDITION: Stable  Diego Ulbricht 11/04/2015 12:57 PM

## 2015-11-05 ENCOUNTER — Encounter: Payer: Self-pay | Admitting: Vascular Surgery

## 2015-11-05 DIAGNOSIS — D509 Iron deficiency anemia, unspecified: Secondary | ICD-10-CM | POA: Diagnosis not present

## 2015-11-05 DIAGNOSIS — Z992 Dependence on renal dialysis: Secondary | ICD-10-CM | POA: Diagnosis not present

## 2015-11-05 DIAGNOSIS — N186 End stage renal disease: Secondary | ICD-10-CM | POA: Diagnosis not present

## 2015-11-05 DIAGNOSIS — N2581 Secondary hyperparathyroidism of renal origin: Secondary | ICD-10-CM | POA: Diagnosis not present

## 2015-11-06 DIAGNOSIS — T82868A Thrombosis of vascular prosthetic devices, implants and grafts, initial encounter: Secondary | ICD-10-CM | POA: Diagnosis not present

## 2015-11-06 DIAGNOSIS — Y841 Kidney dialysis as the cause of abnormal reaction of the patient, or of later complication, without mention of misadventure at the time of the procedure: Secondary | ICD-10-CM | POA: Diagnosis not present

## 2015-11-06 DIAGNOSIS — E669 Obesity, unspecified: Secondary | ICD-10-CM | POA: Diagnosis not present

## 2015-11-06 DIAGNOSIS — E785 Hyperlipidemia, unspecified: Secondary | ICD-10-CM | POA: Diagnosis not present

## 2015-11-06 DIAGNOSIS — N186 End stage renal disease: Secondary | ICD-10-CM | POA: Diagnosis not present

## 2015-11-06 DIAGNOSIS — T82318A Breakdown (mechanical) of other vascular grafts, initial encounter: Secondary | ICD-10-CM | POA: Diagnosis not present

## 2015-11-06 DIAGNOSIS — Z992 Dependence on renal dialysis: Secondary | ICD-10-CM | POA: Diagnosis not present

## 2015-11-06 DIAGNOSIS — I10 Essential (primary) hypertension: Secondary | ICD-10-CM | POA: Diagnosis not present

## 2015-11-07 DIAGNOSIS — N2581 Secondary hyperparathyroidism of renal origin: Secondary | ICD-10-CM | POA: Diagnosis not present

## 2015-11-07 DIAGNOSIS — N186 End stage renal disease: Secondary | ICD-10-CM | POA: Diagnosis not present

## 2015-11-07 DIAGNOSIS — Z992 Dependence on renal dialysis: Secondary | ICD-10-CM | POA: Diagnosis not present

## 2015-11-07 DIAGNOSIS — D509 Iron deficiency anemia, unspecified: Secondary | ICD-10-CM | POA: Diagnosis not present

## 2015-11-10 DIAGNOSIS — D509 Iron deficiency anemia, unspecified: Secondary | ICD-10-CM | POA: Diagnosis not present

## 2015-11-10 DIAGNOSIS — N2581 Secondary hyperparathyroidism of renal origin: Secondary | ICD-10-CM | POA: Diagnosis not present

## 2015-11-10 DIAGNOSIS — Z992 Dependence on renal dialysis: Secondary | ICD-10-CM | POA: Diagnosis not present

## 2015-11-10 DIAGNOSIS — N186 End stage renal disease: Secondary | ICD-10-CM | POA: Diagnosis not present

## 2015-11-11 ENCOUNTER — Other Ambulatory Visit: Payer: Self-pay | Admitting: Vascular Surgery

## 2015-11-12 DIAGNOSIS — N186 End stage renal disease: Secondary | ICD-10-CM | POA: Diagnosis not present

## 2015-11-12 DIAGNOSIS — N2581 Secondary hyperparathyroidism of renal origin: Secondary | ICD-10-CM | POA: Diagnosis not present

## 2015-11-12 DIAGNOSIS — D509 Iron deficiency anemia, unspecified: Secondary | ICD-10-CM | POA: Diagnosis not present

## 2015-11-12 DIAGNOSIS — Z992 Dependence on renal dialysis: Secondary | ICD-10-CM | POA: Diagnosis not present

## 2015-11-13 ENCOUNTER — Encounter
Admission: RE | Admit: 2015-11-13 | Discharge: 2015-11-13 | Disposition: A | Discharge status: Home / Self Care | Payer: Medicare Other | Source: Ambulatory Visit | Attending: Vascular Surgery | Admitting: Vascular Surgery

## 2015-11-13 DIAGNOSIS — Z01810 Encounter for preprocedural cardiovascular examination: Secondary | ICD-10-CM | POA: Insufficient documentation

## 2015-11-13 DIAGNOSIS — Z992 Dependence on renal dialysis: Secondary | ICD-10-CM | POA: Insufficient documentation

## 2015-11-13 DIAGNOSIS — N186 End stage renal disease: Secondary | ICD-10-CM | POA: Diagnosis not present

## 2015-11-13 DIAGNOSIS — E669 Obesity, unspecified: Secondary | ICD-10-CM | POA: Insufficient documentation

## 2015-11-13 DIAGNOSIS — Z01812 Encounter for preprocedural laboratory examination: Secondary | ICD-10-CM | POA: Diagnosis not present

## 2015-11-13 DIAGNOSIS — I509 Heart failure, unspecified: Secondary | ICD-10-CM | POA: Insufficient documentation

## 2015-11-13 LAB — PROTIME-INR
INR: 1.25
PROTHROMBIN TIME: 15.8 s — AB (ref 11.4–15.2)

## 2015-11-13 LAB — CBC WITH DIFFERENTIAL/PLATELET
BASOS ABS: 0.1 10*3/uL (ref 0–0.1)
Basophils Relative: 1 %
EOS ABS: 0.8 10*3/uL — AB (ref 0–0.7)
Eosinophils Relative: 8 %
HCT: 45 % (ref 40.0–52.0)
HEMOGLOBIN: 14.6 g/dL (ref 13.0–18.0)
LYMPHS ABS: 1.2 10*3/uL (ref 1.0–3.6)
Lymphocytes Relative: 13 %
MCH: 24.8 pg — AB (ref 26.0–34.0)
MCHC: 32.6 g/dL (ref 32.0–36.0)
MCV: 76 fL — ABNORMAL LOW (ref 80.0–100.0)
Monocytes Absolute: 0.7 10*3/uL (ref 0.2–1.0)
Monocytes Relative: 8 %
NEUTROS PCT: 70 %
Neutro Abs: 6.7 10*3/uL — ABNORMAL HIGH (ref 1.4–6.5)
Platelets: 308 10*3/uL (ref 150–440)
RBC: 5.92 MIL/uL — AB (ref 4.40–5.90)
RDW: 17.9 % — ABNORMAL HIGH (ref 11.5–14.5)
WBC: 9.4 10*3/uL (ref 3.8–10.6)

## 2015-11-13 LAB — BASIC METABOLIC PANEL
Anion gap: 13 (ref 5–15)
BUN: 31 mg/dL — ABNORMAL HIGH (ref 6–20)
CHLORIDE: 101 mmol/L (ref 101–111)
CO2: 25 mmol/L (ref 22–32)
CREATININE: 10.59 mg/dL — AB (ref 0.61–1.24)
Calcium: 8.6 mg/dL — ABNORMAL LOW (ref 8.9–10.3)
GFR, EST AFRICAN AMERICAN: 6 mL/min — AB (ref >60)
GFR, EST NON AFRICAN AMERICAN: 5 mL/min — AB (ref >60)
Glucose, Bld: 71 mg/dL (ref 65–99)
POTASSIUM: 4.1 mmol/L (ref 3.5–5.1)
SODIUM: 139 mmol/L (ref 135–145)

## 2015-11-13 LAB — TYPE AND SCREEN
ABO/RH(D): B POS
ANTIBODY SCREEN: NEGATIVE

## 2015-11-13 LAB — SURGICAL PCR SCREEN
MRSA, PCR: NEGATIVE
STAPHYLOCOCCUS AUREUS: NEGATIVE

## 2015-11-13 LAB — APTT: APTT: 38 s — AB (ref 24–36)

## 2015-11-13 NOTE — Pre-Procedure Instructions (Signed)
Called Dr. Bunnie Domino office to clarify if and when pt needs to stop Eliquis, instructed to stop med the day prior to surgery.

## 2015-11-13 NOTE — Patient Instructions (Signed)
  Your procedure is scheduled on:Wednesday August 2 , 2017. Report to Same Day Surgery. To find out your arrival time please call [phone] between 1PM - 3PM on Tuesday November 17, 2015.  Remember: Instructions that are not followed completely may result in serious medical risk, up to and including death, or upon the discretion of your surgeon and anesthesiologist your surgery may need to be rescheduled.    _x___ 1. Do not eat food or drink liquids after midnight. No gum chewing or hard candies.     ____ 2. No Alcohol for 24 hours before or after surgery.   ____ 3. Bring all medications with you on the day of surgery if instructed.    __x__ 4. Notify your doctor if there is any change in your medical condition     (cold, fever, infections).     Do not wear jewelry, make-up, hairpins, clips or nail polish.  Do not wear lotions, powders, or perfumes. You may wear deodorant.  Do not shave 48 hours prior to surgery. Men may shave face and neck.  Do not bring valuables to the hospital.    [facility] is not responsible for any belongings or valuables.               Contacts, dentures or bridgework may not be worn into surgery.  Leave your suitcase in the car. After surgery it may be brought to your room.  For patients admitted to the hospital, discharge time is determined by your treatment team.   Patients discharged the day of surgery will not be allowed to drive home.    Please read over the following fact sheets that you were given:   [facility] Preparing for Surgery  ____ Take these medicines the morning of surgery with A SIP OF WATER: None     ____ Fleet Enema (as directed)   __x__ Use SAGE as directed on instruction sheet  ____ Use inhalers on the day of surgery and bring to hospital day of surgery  ____ Stop metformin 2 days prior to surgery    ____ Take 1/2 of usual insulin dose the night before surgery and none on the morning of  surgery.   __x__ Stop apixaban  (ELIQUIS) the day prior to surgery. Last dose is on Monday.  ____ Stop Anti-inflammatories such as Advil, Aleve, Ibuprofen, Motrin, Naproxen,  Naprosyn, Goodies powders or aspirin products.   _x__ Stop supplement: B Complex-C-Folic Acid s until after surgery.    ____ Bring C-Pap to the hospital.

## 2015-11-13 NOTE — Pre-Procedure Instructions (Signed)
Today's lab results faxed to Dr. Bunnie Domino office.

## 2015-11-14 DIAGNOSIS — N186 End stage renal disease: Secondary | ICD-10-CM | POA: Diagnosis not present

## 2015-11-14 DIAGNOSIS — N2581 Secondary hyperparathyroidism of renal origin: Secondary | ICD-10-CM | POA: Diagnosis not present

## 2015-11-14 DIAGNOSIS — D509 Iron deficiency anemia, unspecified: Secondary | ICD-10-CM | POA: Diagnosis not present

## 2015-11-14 DIAGNOSIS — Z992 Dependence on renal dialysis: Secondary | ICD-10-CM | POA: Diagnosis not present

## 2015-11-16 DIAGNOSIS — N186 End stage renal disease: Secondary | ICD-10-CM | POA: Diagnosis not present

## 2015-11-16 DIAGNOSIS — Z992 Dependence on renal dialysis: Secondary | ICD-10-CM | POA: Diagnosis not present

## 2015-11-17 DIAGNOSIS — N186 End stage renal disease: Secondary | ICD-10-CM | POA: Diagnosis not present

## 2015-11-17 DIAGNOSIS — N2581 Secondary hyperparathyroidism of renal origin: Secondary | ICD-10-CM | POA: Diagnosis not present

## 2015-11-17 DIAGNOSIS — Z992 Dependence on renal dialysis: Secondary | ICD-10-CM | POA: Diagnosis not present

## 2015-11-18 ENCOUNTER — Encounter: Payer: Self-pay | Admitting: *Deleted

## 2015-11-18 ENCOUNTER — Ambulatory Visit: Payer: Medicare Other | Admitting: Certified Registered"

## 2015-11-18 ENCOUNTER — Ambulatory Visit
Admission: RE | Admit: 2015-11-18 | Discharge: 2015-11-18 | Disposition: A | Discharge status: Home / Self Care | Payer: Medicare Other | Source: Ambulatory Visit | Attending: Vascular Surgery | Admitting: Vascular Surgery

## 2015-11-18 ENCOUNTER — Encounter
Admission: RE | Disposition: A | Discharge status: Home / Self Care | Payer: Self-pay | Source: Ambulatory Visit | Attending: Vascular Surgery

## 2015-11-18 DIAGNOSIS — Z6841 Body Mass Index (BMI) 40.0 and over, adult: Secondary | ICD-10-CM | POA: Insufficient documentation

## 2015-11-18 DIAGNOSIS — I120 Hypertensive chronic kidney disease with stage 5 chronic kidney disease or end stage renal disease: Secondary | ICD-10-CM | POA: Diagnosis not present

## 2015-11-18 DIAGNOSIS — E785 Hyperlipidemia, unspecified: Secondary | ICD-10-CM | POA: Diagnosis not present

## 2015-11-18 DIAGNOSIS — F17290 Nicotine dependence, other tobacco product, uncomplicated: Secondary | ICD-10-CM | POA: Insufficient documentation

## 2015-11-18 DIAGNOSIS — E669 Obesity, unspecified: Secondary | ICD-10-CM | POA: Insufficient documentation

## 2015-11-18 DIAGNOSIS — Z8249 Family history of ischemic heart disease and other diseases of the circulatory system: Secondary | ICD-10-CM | POA: Insufficient documentation

## 2015-11-18 DIAGNOSIS — I509 Heart failure, unspecified: Secondary | ICD-10-CM | POA: Insufficient documentation

## 2015-11-18 DIAGNOSIS — R000 Tachycardia, unspecified: Secondary | ICD-10-CM | POA: Diagnosis not present

## 2015-11-18 DIAGNOSIS — T82868A Thrombosis of vascular prosthetic devices, implants and grafts, initial encounter: Secondary | ICD-10-CM | POA: Diagnosis not present

## 2015-11-18 DIAGNOSIS — Z992 Dependence on renal dialysis: Secondary | ICD-10-CM | POA: Diagnosis not present

## 2015-11-18 DIAGNOSIS — Z86718 Personal history of other venous thrombosis and embolism: Secondary | ICD-10-CM | POA: Insufficient documentation

## 2015-11-18 DIAGNOSIS — N186 End stage renal disease: Secondary | ICD-10-CM | POA: Diagnosis not present

## 2015-11-18 DIAGNOSIS — Z79899 Other long term (current) drug therapy: Secondary | ICD-10-CM | POA: Insufficient documentation

## 2015-11-18 DIAGNOSIS — Y841 Kidney dialysis as the cause of abnormal reaction of the patient, or of later complication, without mention of misadventure at the time of the procedure: Secondary | ICD-10-CM | POA: Diagnosis not present

## 2015-11-18 DIAGNOSIS — I10 Essential (primary) hypertension: Secondary | ICD-10-CM | POA: Diagnosis not present

## 2015-11-18 DIAGNOSIS — I132 Hypertensive heart and chronic kidney disease with heart failure and with stage 5 chronic kidney disease, or end stage renal disease: Secondary | ICD-10-CM | POA: Insufficient documentation

## 2015-11-18 DIAGNOSIS — T82318A Breakdown (mechanical) of other vascular grafts, initial encounter: Secondary | ICD-10-CM | POA: Diagnosis not present

## 2015-11-18 HISTORY — PX: AV FISTULA PLACEMENT: SHX1204

## 2015-11-18 LAB — POCT I-STAT 4, (NA,K, GLUC, HGB,HCT)
Glucose, Bld: 83 mg/dL (ref 65–99)
HCT: 48 % (ref 39.0–52.0)
HEMOGLOBIN: 16.3 g/dL (ref 13.0–17.0)
POTASSIUM: 4.2 mmol/L (ref 3.5–5.1)
Sodium: 138 mmol/L (ref 135–145)

## 2015-11-18 SURGERY — ARTERIOVENOUS (AV) FISTULA CREATION
Anesthesia: General | Site: Arm Lower | Laterality: Left | Wound class: Clean

## 2015-11-18 MED ORDER — PHENYLEPHRINE HCL 10 MG/ML IJ SOLN
INTRAVENOUS | Status: DC | PRN
  Administered 2015-11-18: 50 ug/min via INTRAVENOUS

## 2015-11-18 MED ORDER — MIDAZOLAM HCL 2 MG/2ML IJ SOLN
INTRAMUSCULAR | Status: DC | PRN
  Administered 2015-11-18: 2 mg via INTRAVENOUS

## 2015-11-18 MED ORDER — BUPIVACAINE-EPINEPHRINE (PF) 0.5% -1:200000 IJ SOLN
INTRAMUSCULAR | Status: AC
  Filled 2015-11-18: qty 30

## 2015-11-18 MED ORDER — SODIUM CHLORIDE 0.9 % IV SOLN
INTRAVENOUS | Status: DC
  Administered 2015-11-18 (×2): via INTRAVENOUS

## 2015-11-18 MED ORDER — CHLORHEXIDINE GLUCONATE CLOTH 2 % EX PADS: 6.0000 | MEDICATED_PAD | Freq: Once | CUTANEOUS | Status: DC

## 2015-11-18 MED ORDER — FENTANYL CITRATE (PF) 100 MCG/2ML IJ SOLN
INTRAMUSCULAR | Status: DC | PRN
  Administered 2015-11-18 (×2): 50 ug via INTRAVENOUS

## 2015-11-18 MED ORDER — LIDOCAINE HCL (CARDIAC) 20 MG/ML IV SOLN
INTRAVENOUS | Status: DC | PRN
  Administered 2015-11-18: 60 mg via INTRAVENOUS

## 2015-11-18 MED ORDER — HEPARIN SODIUM (PORCINE) 1000 UNIT/ML IJ SOLN
INTRAMUSCULAR | Status: DC | PRN
  Administered 2015-11-18: 4 mL via INTRAVENOUS

## 2015-11-18 MED ORDER — SUCCINYLCHOLINE CHLORIDE 20 MG/ML IJ SOLN
INTRAMUSCULAR | Status: DC | PRN
  Administered 2015-11-18: 100 mg via INTRAVENOUS

## 2015-11-18 MED ORDER — PROPOFOL 10 MG/ML IV BOLUS
INTRAVENOUS | Status: DC | PRN
  Administered 2015-11-18: 20 mg via INTRAVENOUS
  Administered 2015-11-18: 200 mg via INTRAVENOUS
  Administered 2015-11-18: 180 mg via INTRAVENOUS

## 2015-11-18 MED ORDER — FENTANYL CITRATE (PF) 100 MCG/2ML IJ SOLN
INTRAMUSCULAR | Status: AC
  Filled 2015-11-18: qty 2

## 2015-11-18 MED ORDER — FAMOTIDINE 20 MG PO TABS
ORAL_TABLET | ORAL | Status: AC
  Administered 2015-11-18: 20 mg via ORAL
  Filled 2015-11-18: qty 1

## 2015-11-18 MED ORDER — CEFAZOLIN SODIUM-DEXTROSE 2-4 GM/100ML-% IV SOLN
INTRAVENOUS | Status: AC
  Filled 2015-11-18: qty 100

## 2015-11-18 MED ORDER — CEFAZOLIN SODIUM-DEXTROSE 2-4 GM/100ML-% IV SOLN
2.0000 g | INTRAVENOUS | Status: AC
  Administered 2015-11-18: 2 g via INTRAVENOUS

## 2015-11-18 MED ORDER — SODIUM CHLORIDE 0.9 % IV SOLN
INTRAVENOUS | Status: DC | PRN
  Administered 2015-11-18: 14:00:00 via INTRAMUSCULAR

## 2015-11-18 MED ORDER — HEPARIN SODIUM (PORCINE) 5000 UNIT/ML IJ SOLN
INTRAMUSCULAR | Status: AC
  Filled 2015-11-18: qty 1

## 2015-11-18 MED ORDER — FAMOTIDINE 20 MG PO TABS
20.0000 mg | ORAL_TABLET | Freq: Once | ORAL | Status: AC
  Administered 2015-11-18: 20 mg via ORAL

## 2015-11-18 MED ORDER — FENTANYL CITRATE (PF) 100 MCG/2ML IJ SOLN
25.0000 ug | INTRAMUSCULAR | Status: DC | PRN
  Administered 2015-11-18: 25 ug via INTRAVENOUS

## 2015-11-18 MED ORDER — HYDROCODONE-ACETAMINOPHEN 5-325 MG PO TABS: 1.0000 | ORAL_TABLET | Freq: Four times a day (QID) | ORAL | 0 refills | Status: DC | PRN

## 2015-11-18 MED ORDER — PHENYLEPHRINE HCL 10 MG/ML IJ SOLN
INTRAMUSCULAR | Status: DC | PRN
  Administered 2015-11-18: 200 ug via INTRAVENOUS
  Administered 2015-11-18: 100 ug via INTRAVENOUS
  Administered 2015-11-18 (×4): 200 ug via INTRAVENOUS

## 2015-11-18 MED ORDER — ONDANSETRON HCL 4 MG/2ML IJ SOLN
INTRAMUSCULAR | Status: DC | PRN
  Administered 2015-11-18: 4 mg via INTRAVENOUS

## 2015-11-18 MED ORDER — PAPAVERINE HCL 30 MG/ML IJ SOLN
INTRAMUSCULAR | Status: AC
  Filled 2015-11-18: qty 2

## 2015-11-18 MED ORDER — ONDANSETRON HCL 4 MG/2ML IJ SOLN: 4.0000 mg | Freq: Once | INTRAMUSCULAR | Status: DC | PRN

## 2015-11-18 SURGICAL SUPPLY — 49 items
BAG DECANTER FOR FLEXI CONT (MISCELLANEOUS) ×2 IMPLANT
BLADE SURG SZ11 CARB STEEL (BLADE) ×2 IMPLANT
BOOT SUTURE AID YELLOW STND (SUTURE) ×2 IMPLANT
BRUSH SCRUB 4% CHG (MISCELLANEOUS) ×2 IMPLANT
CANISTER SUCT 1200ML W/VALVE (MISCELLANEOUS) ×2 IMPLANT
CHLORAPREP W/TINT 26ML (MISCELLANEOUS) ×2 IMPLANT
CLIP SPRNG 6MM S-JAW DBL (CLIP) ×2
ELECT CAUTERY BLADE 6.4 (BLADE) ×2 IMPLANT
ELECT REM PT RETURN 9FT ADLT (ELECTROSURGICAL) ×2
ELECTRODE REM PT RTRN 9FT ADLT (ELECTROSURGICAL) ×1 IMPLANT
GEL ULTRASOUND 20GR AQUASONIC (MISCELLANEOUS) IMPLANT
GLOVE BIO SURGEON STRL SZ7 (GLOVE) ×4 IMPLANT
GLOVE INDICATOR 7.5 STRL GRN (GLOVE) ×2 IMPLANT
GOWN STRL REUS W/ TWL LRG LVL3 (GOWN DISPOSABLE) ×2 IMPLANT
GOWN STRL REUS W/ TWL XL LVL3 (GOWN DISPOSABLE) ×1 IMPLANT
GOWN STRL REUS W/TWL LRG LVL3 (GOWN DISPOSABLE) ×4
GOWN STRL REUS W/TWL XL LVL3 (GOWN DISPOSABLE) ×2
HEMOSTAT SURGICEL 2X3 (HEMOSTASIS) ×2 IMPLANT
IV NS 500ML (IV SOLUTION) ×2
IV NS 500ML BAXH (IV SOLUTION) ×1 IMPLANT
KIT RM TURNOVER STRD PROC AR (KITS) ×2 IMPLANT
LABEL OR SOLS (LABEL) ×2 IMPLANT
LIQUID BAND (GAUZE/BANDAGES/DRESSINGS) ×2 IMPLANT
LOOP RED MAXI  1X406MM (MISCELLANEOUS) ×1
LOOP VESSEL MAXI 1X406 RED (MISCELLANEOUS) ×1 IMPLANT
LOOP VESSEL MINI 0.8X406 BLUE (MISCELLANEOUS) ×1 IMPLANT
LOOPS BLUE MINI 0.8X406MM (MISCELLANEOUS) ×1
NEEDLE FILTER BLUNT 18X 1/2SAF (NEEDLE) ×1
NEEDLE FILTER BLUNT 18X1 1/2 (NEEDLE) ×1 IMPLANT
NEEDLE HYPO 30X.5 LL (NEEDLE) IMPLANT
NS IRRIG 500ML POUR BTL (IV SOLUTION) ×2 IMPLANT
PACK EXTREMITY ARMC (MISCELLANEOUS) ×2 IMPLANT
PAD PREP 24X41 OB/GYN DISP (PERSONAL CARE ITEMS) ×2 IMPLANT
SOLUTION CELL SAVER (CLIP) ×1 IMPLANT
STOCKINETTE STRL 4IN 9604848 (GAUZE/BANDAGES/DRESSINGS) ×2 IMPLANT
SUT MNCRL AB 4-0 PS2 18 (SUTURE) ×2 IMPLANT
SUT PROLENE 6 0 BV (SUTURE) ×12 IMPLANT
SUT SILK 2 0 (SUTURE) ×4
SUT SILK 2-0 18XBRD TIE 12 (SUTURE) ×2 IMPLANT
SUT SILK 3 0 (SUTURE) ×2
SUT SILK 3-0 18XBRD TIE 12 (SUTURE) ×1 IMPLANT
SUT SILK 4 0 (SUTURE) ×2
SUT SILK 4-0 18XBRD TIE 12 (SUTURE) ×1 IMPLANT
SUT VIC AB 3-0 SH 27 (SUTURE) ×4
SUT VIC AB 3-0 SH 27X BRD (SUTURE) ×2 IMPLANT
SYR 20CC LL (SYRINGE) ×2 IMPLANT
SYR 3ML LL SCALE MARK (SYRINGE) ×2 IMPLANT
SYR TB 1ML 27GX1/2 LL (SYRINGE) ×2 IMPLANT
TOWEL OR 17X26 4PK STRL BLUE (TOWEL DISPOSABLE) IMPLANT

## 2015-11-18 NOTE — Op Note (Signed)
[facility]   OPERATIVE NOTE   PROCEDURE: Left radiocephaic arteriovenous fistula placement  PRE-OPERATIVE DIAGNOSIS: 1. ESRD  POST-OPERATIVE DIAGNOSIS: Same  SURGEON: Reagan Behlke, MD  ASSISTANT(S): none  ANESTHESIA: general  ESTIMATED BLOOD LOSS: 10 cc  FINDING(S): Adequate cephalic vein, calcific disease of the radial artery  SPECIMEN(S):  None  INDICATIONS:   [name] [name] is a 50 y.o. male who presents with ESRD.  The patient is scheduled for left radiocephalic arteriovenous fistula placement when non-invasive studies suggested adequate anatomy for fistula creation at this location.  The patient is aware the risks include but are not limited to: bleeding, infection, steal syndrome, nerve damage, ischemic monomelic neuropathy, failure to mature, and need for additional procedures.  The patient is aware of the risks of the procedure and elects to proceed forward.  DESCRIPTION: After full informed written consent was obtained from the patient, the patient was brought back to the operating room and placed supine upon the operating table.  Prior to induction, the patient received IV antibiotics.   After obtaining adequate anesthesia, the patient was then prepped and draped in the standard fashion for a left arm access procedure.  I turned my attention first to identifying the patient's distal cephalic vein and radial artery.  I made an incision at the level of the distal forearm and wrist and dissected through the subcutaneous tissue and fascia to gain exposure of the radial artery.  This was noted to be of calcific and diseased, but appeared useable for fistula creation.  This was dissected out proximally and distally and controlled with vessel loops.  I then dissected out the cephalic vein.  This was noted to be patent and adequate size for fistula creation. I then gave the patient 4000 units of intravenous heparin.  The distal segment of the vein was ligated with  a  2-0 silk, and the vein was transected.  I then instilled the heparinized saline into the vein and clamped it.  At this point, I reset my exposure of the radial artery and placed the artery under tension proximally and distally.  I made an arteriotomy with a #11 blade, and then I extended the arteriotomy with a Potts scissor.  I injected heparinized saline proximal and distal to this arteriotomy.  The vein was then sewn to the artery in an end-to-side configuration with a running stitch of 6-0 Prolene.  Prior to completing this anastomosis, I allowed the vein and artery to backbleed.  There was no evidence of clot from any vessels.  I completed the anastomosis in the usual fashion and then released all vessel loops and clamps. A single 6-0 Prolene patch suture was used for hemostasis. There was a weakly palpable thrill in the venous outflow and continuous wave doppler was used to confirm adequate flow, and there was a palpable radial pulse beyond the anastomosis.  At this point, I irrigated out the surgical wound. Surgicel was placed. There was no further active bleeding.  The subcutaneous tissue was reapproximated with a running stitch of 3-0 Vicryl.  The skin was then reapproximated with a running subcuticular stitch of 4-0 Monocryl.  The skin was then cleaned, dried, and reinforced with Dermabond.  The patient tolerated this procedure well and was taken to the recovery room in stable condition  COMPLICATIONS: None  CONDITION: Stable   Marjie Chea, MD 11/18/2015 3:17 PM

## 2015-11-18 NOTE — Anesthesia Procedure Notes (Signed)
Procedure Name: LMA Insertion Date/Time: 11/18/2015 1:49 PM Performed by: Silvana Newness Pre-anesthesia Checklist: Patient identified, Emergency Drugs available, Suction available, Patient being monitored and Timeout performed Patient Re-evaluated:Patient Re-evaluated prior to inductionOxygen Delivery Method: Circle system utilized Preoxygenation: Pre-oxygenation with 100% oxygen Intubation Type: IV induction Ventilation: Two handed mask ventilation required and Oral airway inserted - appropriate to patient size LMA: LMA inserted LMA Size: 4.5 Number of attempts: 1 Placement Confirmation: positive ETCO2 and breath sounds checked- equal and bilateral Tube secured with: Tape Dental Injury: Teeth and Oropharynx as per pre-operative assessment  Comments: LMA placed by Dr. Boston Service.  Not sealing well, TVs low, sats low 90s.  Decision made to convert to ETT

## 2015-11-18 NOTE — H&P (Signed)
  [facility] History & Physical Update  The patient was interviewed and re-examined.  The patient's previous History and Physical has been reviewed and is unchanged.  There is no change in the plan of care. We plan to proceed with the scheduled procedure.  Jacoby Ritsema, MD  11/18/2015, 12:54 PM

## 2015-11-18 NOTE — Anesthesia Procedure Notes (Signed)
Procedure Name: Intubation Date/Time: 11/18/2015 1:57 PM Performed by: Silvana Newness Pre-anesthesia Checklist: Patient identified, Emergency Drugs available, Suction available, Patient being monitored and Timeout performed Patient Re-evaluated:Patient Re-evaluated prior to inductionOxygen Delivery Method: Circle system utilized Preoxygenation: Pre-oxygenation with 100% oxygen Intubation Type: IV induction Ventilation: Two handed mask ventilation required and Oral airway inserted - appropriate to patient size Laryngoscope Size: Mac and 4 Grade View: Grade II Tube type: Oral Tube size: 7.5 mm Number of attempts: 1 Airway Equipment and Method: Stylet Placement Confirmation: ETT inserted through vocal cords under direct vision,  positive ETCO2 and breath sounds checked- equal and bilateral Secured at: 22 cm Tube secured with: Tape Dental Injury: Teeth and Oropharynx as per pre-operative assessment  Comments: ETT placed by Dr. Boston Service, sats increased, adequate TVs.

## 2015-11-18 NOTE — Transfer of Care (Signed)
Immediate Anesthesia Transfer of Care Note  Patient: [name] [name]  Procedure(s) Performed: Procedure(s): ARTERIOVENOUS (AV) FISTULA CREATION ( RADIOCEPHALIC ) (Left)  Patient Location: PACU  Anesthesia Type:General  Level of Consciousness: awake, alert , oriented and patient cooperative  Airway & Oxygen Therapy: Patient Spontanous Breathing and Patient connected to face mask oxygen  Post-op Assessment: Report given to RN, Post -op Vital signs reviewed and stable and Patient moving all extremities X 4  Post vital signs: Reviewed and stable  Last Vitals:  Vitals:   11/18/15 1141 11/18/15 1527  BP: 91/73 95/65  Pulse: (!) 108 (!) 45  Resp: 18 (!) 29  Temp: 36.8 C 36.1 C    Last Pain:  Vitals:   11/18/15 1141  TempSrc: Oral         Complications: No apparent anesthesia complications

## 2015-11-18 NOTE — Anesthesia Preprocedure Evaluation (Signed)
Anesthesia Evaluation  Patient identified by MRN, date of birth, ID band Patient awake, Patient confused and Patient unresponsive    Airway Mallampati: II       Dental  (+) Teeth Intact, Poor Dentition   Pulmonary former smoker,    breath sounds clear to auscultation       Cardiovascular hypertension, +CHF   Rhythm:Regular     Neuro/Psych    GI/Hepatic negative GI ROS, Neg liver ROS,   Endo/Other  Morbid obesity  Renal/GU CRFRenal disease     Musculoskeletal   Abdominal (+) + obese,   Peds  Hematology   Anesthesia Other Findings   Reproductive/Obstetrics                             Anesthesia Physical Anesthesia Plan  ASA: III  Anesthesia Plan: General   Post-op Pain Management:    Induction: Intravenous  Airway Management Planned: LMA  Additional Equipment:   Intra-op Plan:   Post-operative Plan: Extubation in OR  Informed Consent: I have reviewed the patients History and Physical, chart, labs and discussed the procedure including the risks, benefits and alternatives for the proposed anesthesia with the patient or authorized representative who has indicated his/her understanding and acceptance.     Plan Discussed with: CRNA  Anesthesia Plan Comments:         Anesthesia Quick Evaluation

## 2015-11-18 NOTE — Anesthesia Postprocedure Evaluation (Signed)
Anesthesia Post Note  Patient: [name] [name]  Procedure(s) Performed: Procedure(s) (LRB): ARTERIOVENOUS (AV) FISTULA CREATION ( RADIOCEPHALIC ) (Left)  Patient location during evaluation: PACU Anesthesia Type: General Level of consciousness: awake and alert Pain management: pain level controlled Vital Signs Assessment: post-procedure vital signs reviewed and stable Respiratory status: spontaneous breathing, nonlabored ventilation, respiratory function stable and patient connected to nasal cannula oxygen Cardiovascular status: blood pressure returned to baseline and stable Postop Assessment: no signs of nausea or vomiting Anesthetic complications: no    Last Vitals:  Vitals:   11/18/15 1633 11/18/15 1707  BP: 94/60 (!) 94/59  Pulse: 99 99  Resp: 12 14  Temp: 36.9 C 36.8 C    Last Pain:  Vitals:   11/18/15 1707  TempSrc: Oral  PainSc: 1                  Precious Haws Piscitello

## 2015-11-18 NOTE — Discharge Instructions (Signed)

## 2015-11-19 ENCOUNTER — Encounter: Payer: Self-pay | Admitting: Vascular Surgery

## 2015-11-19 DIAGNOSIS — N186 End stage renal disease: Secondary | ICD-10-CM | POA: Diagnosis not present

## 2015-11-19 DIAGNOSIS — N2581 Secondary hyperparathyroidism of renal origin: Secondary | ICD-10-CM | POA: Diagnosis not present

## 2015-11-19 DIAGNOSIS — Z992 Dependence on renal dialysis: Secondary | ICD-10-CM | POA: Diagnosis not present

## 2015-11-21 DIAGNOSIS — Z992 Dependence on renal dialysis: Secondary | ICD-10-CM | POA: Diagnosis not present

## 2015-11-21 DIAGNOSIS — N2581 Secondary hyperparathyroidism of renal origin: Secondary | ICD-10-CM | POA: Diagnosis not present

## 2015-11-21 DIAGNOSIS — N186 End stage renal disease: Secondary | ICD-10-CM | POA: Diagnosis not present

## 2015-11-24 DIAGNOSIS — Z992 Dependence on renal dialysis: Secondary | ICD-10-CM | POA: Diagnosis not present

## 2015-11-24 DIAGNOSIS — N2581 Secondary hyperparathyroidism of renal origin: Secondary | ICD-10-CM | POA: Diagnosis not present

## 2015-11-24 DIAGNOSIS — N186 End stage renal disease: Secondary | ICD-10-CM | POA: Diagnosis not present

## 2015-11-26 DIAGNOSIS — N186 End stage renal disease: Secondary | ICD-10-CM | POA: Diagnosis not present

## 2015-11-26 DIAGNOSIS — Z992 Dependence on renal dialysis: Secondary | ICD-10-CM | POA: Diagnosis not present

## 2015-11-26 DIAGNOSIS — N2581 Secondary hyperparathyroidism of renal origin: Secondary | ICD-10-CM | POA: Diagnosis not present

## 2015-11-28 DIAGNOSIS — N186 End stage renal disease: Secondary | ICD-10-CM | POA: Diagnosis not present

## 2015-11-28 DIAGNOSIS — N2581 Secondary hyperparathyroidism of renal origin: Secondary | ICD-10-CM | POA: Diagnosis not present

## 2015-11-28 DIAGNOSIS — Z992 Dependence on renal dialysis: Secondary | ICD-10-CM | POA: Diagnosis not present

## 2015-12-01 DIAGNOSIS — N186 End stage renal disease: Secondary | ICD-10-CM | POA: Diagnosis not present

## 2015-12-01 DIAGNOSIS — Z992 Dependence on renal dialysis: Secondary | ICD-10-CM | POA: Diagnosis not present

## 2015-12-01 DIAGNOSIS — N2581 Secondary hyperparathyroidism of renal origin: Secondary | ICD-10-CM | POA: Diagnosis not present

## 2015-12-03 DIAGNOSIS — N186 End stage renal disease: Secondary | ICD-10-CM | POA: Diagnosis not present

## 2015-12-03 DIAGNOSIS — Z992 Dependence on renal dialysis: Secondary | ICD-10-CM | POA: Diagnosis not present

## 2015-12-03 DIAGNOSIS — N2581 Secondary hyperparathyroidism of renal origin: Secondary | ICD-10-CM | POA: Diagnosis not present

## 2015-12-05 DIAGNOSIS — N186 End stage renal disease: Secondary | ICD-10-CM | POA: Diagnosis not present

## 2015-12-05 DIAGNOSIS — Z992 Dependence on renal dialysis: Secondary | ICD-10-CM | POA: Diagnosis not present

## 2015-12-05 DIAGNOSIS — N2581 Secondary hyperparathyroidism of renal origin: Secondary | ICD-10-CM | POA: Diagnosis not present

## 2015-12-08 DIAGNOSIS — N2581 Secondary hyperparathyroidism of renal origin: Secondary | ICD-10-CM | POA: Diagnosis not present

## 2015-12-08 DIAGNOSIS — N186 End stage renal disease: Secondary | ICD-10-CM | POA: Diagnosis not present

## 2015-12-08 DIAGNOSIS — Z992 Dependence on renal dialysis: Secondary | ICD-10-CM | POA: Diagnosis not present

## 2015-12-09 ENCOUNTER — Other Ambulatory Visit: Payer: Self-pay | Admitting: Vascular Surgery

## 2015-12-09 DIAGNOSIS — T82868A Thrombosis of vascular prosthetic devices, implants and grafts, initial encounter: Secondary | ICD-10-CM | POA: Diagnosis not present

## 2015-12-09 DIAGNOSIS — I10 Essential (primary) hypertension: Secondary | ICD-10-CM | POA: Diagnosis not present

## 2015-12-09 DIAGNOSIS — N186 End stage renal disease: Secondary | ICD-10-CM | POA: Diagnosis not present

## 2015-12-09 DIAGNOSIS — E785 Hyperlipidemia, unspecified: Secondary | ICD-10-CM | POA: Diagnosis not present

## 2015-12-09 DIAGNOSIS — E669 Obesity, unspecified: Secondary | ICD-10-CM | POA: Diagnosis not present

## 2015-12-09 DIAGNOSIS — T82318A Breakdown (mechanical) of other vascular grafts, initial encounter: Secondary | ICD-10-CM | POA: Diagnosis not present

## 2015-12-09 DIAGNOSIS — Y841 Kidney dialysis as the cause of abnormal reaction of the patient, or of later complication, without mention of misadventure at the time of the procedure: Secondary | ICD-10-CM | POA: Diagnosis not present

## 2015-12-09 DIAGNOSIS — Z992 Dependence on renal dialysis: Secondary | ICD-10-CM | POA: Diagnosis not present

## 2015-12-10 DIAGNOSIS — N2581 Secondary hyperparathyroidism of renal origin: Secondary | ICD-10-CM | POA: Diagnosis not present

## 2015-12-10 DIAGNOSIS — N186 End stage renal disease: Secondary | ICD-10-CM | POA: Diagnosis not present

## 2015-12-10 DIAGNOSIS — Z992 Dependence on renal dialysis: Secondary | ICD-10-CM | POA: Diagnosis not present

## 2015-12-12 DIAGNOSIS — N186 End stage renal disease: Secondary | ICD-10-CM | POA: Diagnosis not present

## 2015-12-12 DIAGNOSIS — N2581 Secondary hyperparathyroidism of renal origin: Secondary | ICD-10-CM | POA: Diagnosis not present

## 2015-12-12 DIAGNOSIS — Z992 Dependence on renal dialysis: Secondary | ICD-10-CM | POA: Diagnosis not present

## 2015-12-14 ENCOUNTER — Encounter: Payer: Self-pay | Admitting: *Deleted

## 2015-12-14 ENCOUNTER — Encounter
Admission: RE | Disposition: A | Discharge status: Home / Self Care | Payer: Self-pay | Source: Ambulatory Visit | Attending: Vascular Surgery

## 2015-12-14 ENCOUNTER — Ambulatory Visit
Admission: RE | Admit: 2015-12-14 | Discharge: 2015-12-14 | Disposition: A | Discharge status: Home / Self Care | Payer: Medicare Other | Source: Ambulatory Visit | Attending: Vascular Surgery | Admitting: Vascular Surgery

## 2015-12-14 DIAGNOSIS — I509 Heart failure, unspecified: Secondary | ICD-10-CM | POA: Diagnosis not present

## 2015-12-14 DIAGNOSIS — Z833 Family history of diabetes mellitus: Secondary | ICD-10-CM | POA: Diagnosis not present

## 2015-12-14 DIAGNOSIS — N186 End stage renal disease: Secondary | ICD-10-CM | POA: Insufficient documentation

## 2015-12-14 DIAGNOSIS — Z6841 Body Mass Index (BMI) 40.0 and over, adult: Secondary | ICD-10-CM | POA: Diagnosis not present

## 2015-12-14 DIAGNOSIS — Z7982 Long term (current) use of aspirin: Secondary | ICD-10-CM | POA: Diagnosis not present

## 2015-12-14 DIAGNOSIS — E669 Obesity, unspecified: Secondary | ICD-10-CM | POA: Diagnosis not present

## 2015-12-14 DIAGNOSIS — Z8249 Family history of ischemic heart disease and other diseases of the circulatory system: Secondary | ICD-10-CM | POA: Insufficient documentation

## 2015-12-14 DIAGNOSIS — T82858A Stenosis of vascular prosthetic devices, implants and grafts, initial encounter: Secondary | ICD-10-CM | POA: Insufficient documentation

## 2015-12-14 DIAGNOSIS — I10 Essential (primary) hypertension: Secondary | ICD-10-CM | POA: Diagnosis not present

## 2015-12-14 DIAGNOSIS — Y832 Surgical operation with anastomosis, bypass or graft as the cause of abnormal reaction of the patient, or of later complication, without mention of misadventure at the time of the procedure: Secondary | ICD-10-CM | POA: Diagnosis not present

## 2015-12-14 DIAGNOSIS — T82318A Breakdown (mechanical) of other vascular grafts, initial encounter: Secondary | ICD-10-CM | POA: Diagnosis not present

## 2015-12-14 DIAGNOSIS — T82598A Other mechanical complication of other cardiac and vascular devices and implants, initial encounter: Secondary | ICD-10-CM | POA: Diagnosis not present

## 2015-12-14 DIAGNOSIS — E785 Hyperlipidemia, unspecified: Secondary | ICD-10-CM | POA: Diagnosis not present

## 2015-12-14 DIAGNOSIS — Z992 Dependence on renal dialysis: Secondary | ICD-10-CM | POA: Insufficient documentation

## 2015-12-14 DIAGNOSIS — Z7902 Long term (current) use of antithrombotics/antiplatelets: Secondary | ICD-10-CM | POA: Insufficient documentation

## 2015-12-14 DIAGNOSIS — I771 Stricture of artery: Secondary | ICD-10-CM | POA: Diagnosis not present

## 2015-12-14 DIAGNOSIS — Y841 Kidney dialysis as the cause of abnormal reaction of the patient, or of later complication, without mention of misadventure at the time of the procedure: Secondary | ICD-10-CM | POA: Diagnosis not present

## 2015-12-14 DIAGNOSIS — I868 Varicose veins of other specified sites: Secondary | ICD-10-CM | POA: Insufficient documentation

## 2015-12-14 DIAGNOSIS — T82868A Thrombosis of vascular prosthetic devices, implants and grafts, initial encounter: Secondary | ICD-10-CM | POA: Diagnosis not present

## 2015-12-14 HISTORY — PX: PERIPHERAL VASCULAR CATHETERIZATION: SHX172C

## 2015-12-14 LAB — POTASSIUM (ARMC VASCULAR LAB ONLY): Potassium (ARMC vascular lab): 5.1 (ref 3.5–5.1)

## 2015-12-14 SURGERY — A/V SHUNTOGRAM/FISTULAGRAM
Anesthesia: Moderate Sedation | Site: Arm Upper

## 2015-12-14 MED ORDER — FENTANYL CITRATE (PF) 100 MCG/2ML IJ SOLN
INTRAMUSCULAR | Status: DC | PRN
  Administered 2015-12-14 (×3): 50 ug via INTRAVENOUS

## 2015-12-14 MED ORDER — HEPARIN SODIUM (PORCINE) 1000 UNIT/ML IJ SOLN
INTRAMUSCULAR | Status: AC
  Filled 2015-12-14: qty 1

## 2015-12-14 MED ORDER — FENTANYL CITRATE (PF) 100 MCG/2ML IJ SOLN
INTRAMUSCULAR | Status: AC
  Filled 2015-12-14: qty 2

## 2015-12-14 MED ORDER — METHYLPREDNISOLONE SODIUM SUCC 125 MG IJ SOLR: 125.0000 mg | INTRAMUSCULAR | Status: DC | PRN

## 2015-12-14 MED ORDER — DEXTROSE 5 % IV SOLN
1.5000 g | INTRAVENOUS | Status: AC
  Administered 2015-12-14: 1.5 g via INTRAVENOUS

## 2015-12-14 MED ORDER — HEPARIN SODIUM (PORCINE) 1000 UNIT/ML IJ SOLN
INTRAMUSCULAR | Status: DC | PRN
  Administered 2015-12-14: 4000 [IU] via INTRAVENOUS

## 2015-12-14 MED ORDER — ONDANSETRON HCL 4 MG/2ML IJ SOLN: 4.0000 mg | Freq: Four times a day (QID) | INTRAMUSCULAR | Status: DC | PRN

## 2015-12-14 MED ORDER — MIDAZOLAM HCL 5 MG/5ML IJ SOLN
INTRAMUSCULAR | Status: AC
  Filled 2015-12-14: qty 5

## 2015-12-14 MED ORDER — MIDAZOLAM HCL 2 MG/2ML IJ SOLN
INTRAMUSCULAR | Status: DC | PRN
  Administered 2015-12-14 (×2): 2 mg via INTRAVENOUS

## 2015-12-14 MED ORDER — FAMOTIDINE 20 MG PO TABS: 40.0000 mg | ORAL_TABLET | ORAL | Status: DC | PRN

## 2015-12-14 MED ORDER — SODIUM CHLORIDE 0.9 % IV SOLN: INTRAVENOUS | Status: DC

## 2015-12-14 MED ORDER — HYDROMORPHONE HCL 1 MG/ML IJ SOLN: 1.0000 mg | Freq: Once | INTRAMUSCULAR | Status: DC

## 2015-12-14 MED ORDER — HEPARIN (PORCINE) IN NACL 2-0.9 UNIT/ML-% IJ SOLN
INTRAMUSCULAR | Status: AC
  Filled 2015-12-14: qty 1000

## 2015-12-14 MED ORDER — IOPAMIDOL (ISOVUE-300) INJECTION 61%
INTRAVENOUS | Status: DC | PRN
  Administered 2015-12-14: 55 mL via INTRAVENOUS

## 2015-12-14 MED ORDER — LIDOCAINE-EPINEPHRINE (PF) 1 %-1:200000 IJ SOLN
INTRAMUSCULAR | Status: AC
  Filled 2015-12-14: qty 30

## 2015-12-14 SURGICAL SUPPLY — 15 items
BALLN LUTONIX DCB 5X60X130 (BALLOONS) ×4
BALLN ULTRVRSE 3X100X75C (BALLOONS) ×4
BALLN ULTRVRSE 7X200X75 (BALLOONS) ×4
BALLOON LUTONIX DCB 5X60X130 (BALLOONS) ×2 IMPLANT
BALLOON ULTRVRSE 3X100X75C (BALLOONS) ×2 IMPLANT
BALLOON ULTRVRSE 7X200X75 (BALLOONS) ×2 IMPLANT
CANNULA 5F STIFF (CANNULA) ×8 IMPLANT
CATH TORCON 5FR 0.38 (CATHETERS) ×4 IMPLANT
DRAPE BRACHIAL (DRAPES) ×4 IMPLANT
GLIDEWIRE STIFF .35X180X3 HYDR (WIRE) ×4 IMPLANT
PACK ANGIOGRAPHY (CUSTOM PROCEDURE TRAY) ×4 IMPLANT
SHEATH 5FRX5.5 PEDS (SHEATH) ×4 IMPLANT
SHEATH BRITE TIP 6FRX5.5 (SHEATH) ×4 IMPLANT
TOWEL OR 17X26 4PK STRL BLUE (TOWEL DISPOSABLE) ×4 IMPLANT
WIRE MAGIC TORQUE 260C (WIRE) ×4 IMPLANT

## 2015-12-14 NOTE — Progress Notes (Signed)
Patient is tolerating PO without complication. Denies pain at this time. Family here to transport to home.

## 2015-12-14 NOTE — H&P (Signed)
  [facility] History & Physical Update  The patient was interviewed and re-examined.  The patient's previous History and Physical has been reviewed and is unchanged.  There is no change in the plan of care. We plan to proceed with the scheduled procedure.  DEW,JASON, MD  12/14/2015, 12:24 PM

## 2015-12-14 NOTE — Op Note (Signed)
[facility]    OPERATIVE NOTE   PROCEDURE: 1.   Left radiocephalic arteriovenous fistula cannulation under ultrasound guidance 2.   Left radial artery cannulation under ultrasound guidance distal to the fistula to evaluate the arterial inflow as well as the fistula 3.   Left arm fistulagram including central venogram and left upper extremity angiogram 4.   Percutaneous transluminal angioplasty of the radial artery proximal to an appendectomy anastomosis with 3 mm diameter by 10 cm length angioplasty balloon 5.   Percutaneous transluminal angioplasty of the cephalic vein with a 5 mm diameter by 6 cm length Lutonix drug-coated angioplasty balloon in the cephalic vein near the anastomosis and a 7 mm diameter by 20 cm balloon in the forearm cephalic vein  PRE-OPERATIVE DIAGNOSIS: 1. ESRD 2. Poorly functional left radiocephalic AVF  POST-OPERATIVE DIAGNOSIS: same as above   SURGEON: Leotis Pain, MD  ANESTHESIA: local with MCS  ESTIMATED BLOOD LOSS: 25 cc  FINDING(S): 1. Stenosis in the radial artery proximal to the fistula limiting inflow as well as diffuse narrowing and small vein within the cephalic vein which then drained across the forearm into the basilic vein.  SPECIMEN(S):  None  CONTRAST: 55 cc  FLUORO TIME: 8.7 minutes  MODERATE CONSCIOUS SEDATION TIME: Approximately 60 minutes with 4 mg of Versed and 150 mcg of Fentanyl   INDICATIONS: [name] [name] is a 50 y.o. male who presents with malfunctioning  left radiocephalic arteriovenous fistula.  The patient is scheduled for  left arm fistulagram.  The patient is aware the risks include but are not limited to: bleeding, infection, thrombosis of the cannulated access, and possible anaphylactic reaction to the contrast.  The patient is aware of the risks of the procedure and elects to proceed forward.  DESCRIPTION: After full informed written consent was obtained, the patient was brought back to the  angiography suite and placed supine upon the angiography table.  The patient was connected to monitoring equipment. Moderate conscious sedation was administered with a face to face encounter with the patient throughout the procedure with my supervision of the RN administering medicines and monitoring the patient's vital signs and mental status throughout from the start of the procedure until the patient was taken to the recovery room. The  left arm was prepped and draped in the standard fashion for a percutaneous access intervention.  Under ultrasound guidance, the  mid forearm cephalic vein portion of the arteriovenous fistula was cannulated with a micropuncture needle under direct ultrasound guidance in a retrograde fashion and a permanent image was performed.  The microwire was advanced into the fistula and the needle was exchanged for the a microsheath.  I then upsized to a 6 Fr Sheath and imaging was performed.  Hand injections were completed to image the access including the central venous system. 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 There were a litany of branches in the cephalic vein in the forearm, and I was unable to ever visualize the radial artery to cephalic vein anastomosis or the arterial inflow. With this, I elected to access the radial artery distal to the fistula to evaluate the arterial inflow as well as to gain access to the actual fistula. The radial artery at the wrist about 5 cm distal to the surgical site was evaluated with ultrasound and found to be calcified but patent. It was accessed under direct ultrasound guidance with a micropuncture needle and a micropuncture wire  and sheath were then placed. Imaging of the forearm arterial circuit showed normal brachial bifurcation with a normal ulnar artery into the hand and a radial artery that was narrowed at and proximal to the anastomosis such that a 5 French sheath were  catheter was occlusive and no flow was seen into the fistula. It was difficult to discern the degree of stenosis, but with this finding was felt to be near occlusive. Based on the images, this patient will need arterial intervention as well as angioplasty of the cephalic vein. I then gave the patient 4000 units of intravenous heparin. I upsized to a 5 [place] sheath that initially was parked in the radial artery proximal to the anastomosis with its tip.  I then crossed the stenosis in the radial artery with a Magic Tourqe wire.  Based on the imaging, a 3 mm x 10 cm  angioplasty balloon was selected.  The balloon was centered around the radial artery stenosis and inflated to 10 ATM for 1 minute(s).  On completion imaging, a 20-25 % residual stenosis was present. There was now flow going into the cephalic vein in the fistula. There was narrowing in the cephalic vein a few centimeters beyond the anastomosis near the branch. The dominant outflow was actually across the forearm in the basilic vein. I then redirected the sheath with a Magic torque wire up into the cephalic vein to treat this portion of the fistula. The wire was parked into the upper arm. I used a 5 mm diameter by 6 cm length Lutonix drug-coated angioplasty balloon to treat the cephalic vein just proximal to the anastomosis to the radial artery. This was inflated to 8 atm for 1 minute. I then used a 7 mm diameter by 20 cm length conventional angioplasty balloon to treat the cephalic vein in the forearm near the antecubital fossa and down to the mid to distal forearm. Due to the smaller size of the vein, the second inflation was essentially a balloon assisted maturation of the fistula. This was inflated to 10 atm for 1 minute. Completion angiogram showed more brisk flow through the fistula with several areas of mild residual narrowing and 30-40% range but nothing that appeared flow limiting. I felt we had done this much today as possible, and we may come  back in a later date and balloon the cephalic vein larger for further maturation assistance.   Based on the completion imaging, no further intervention is necessary.  The wire and balloon were removed from the sheath.  A 4-0 Monocryl purse-string suture was sewn around the sheath at both sites and both sheaths were removed similarly.  The sheath was removed while tying down the suture.  A sterile bandage was applied to the puncture site.  COMPLICATIONS: None  CONDITION: Stable   [name],[name]  12/14/2015 3:03 PM

## 2015-12-15 ENCOUNTER — Encounter: Payer: Self-pay | Admitting: Vascular Surgery

## 2015-12-15 DIAGNOSIS — N2581 Secondary hyperparathyroidism of renal origin: Secondary | ICD-10-CM | POA: Diagnosis not present

## 2015-12-15 DIAGNOSIS — N186 End stage renal disease: Secondary | ICD-10-CM | POA: Diagnosis not present

## 2015-12-15 DIAGNOSIS — Z992 Dependence on renal dialysis: Secondary | ICD-10-CM | POA: Diagnosis not present

## 2015-12-17 ENCOUNTER — Ambulatory Visit
Admission: RE | Admit: 2015-12-17 | Discharge: 2015-12-17 | Disposition: A | Discharge status: Home / Self Care | Payer: Medicare Other | Source: Ambulatory Visit | Attending: Vascular Surgery | Admitting: Vascular Surgery

## 2015-12-17 ENCOUNTER — Other Ambulatory Visit: Payer: Self-pay | Admitting: Vascular Surgery

## 2015-12-17 ENCOUNTER — Encounter
Admission: RE | Disposition: A | Discharge status: Home / Self Care | Payer: Self-pay | Source: Ambulatory Visit | Attending: Vascular Surgery

## 2015-12-17 DIAGNOSIS — Z7902 Long term (current) use of antithrombotics/antiplatelets: Secondary | ICD-10-CM | POA: Insufficient documentation

## 2015-12-17 DIAGNOSIS — T82598A Other mechanical complication of other cardiac and vascular devices and implants, initial encounter: Secondary | ICD-10-CM | POA: Diagnosis not present

## 2015-12-17 DIAGNOSIS — Z7982 Long term (current) use of aspirin: Secondary | ICD-10-CM | POA: Insufficient documentation

## 2015-12-17 DIAGNOSIS — Z833 Family history of diabetes mellitus: Secondary | ICD-10-CM | POA: Insufficient documentation

## 2015-12-17 DIAGNOSIS — Z6841 Body Mass Index (BMI) 40.0 and over, adult: Secondary | ICD-10-CM | POA: Insufficient documentation

## 2015-12-17 DIAGNOSIS — Z992 Dependence on renal dialysis: Secondary | ICD-10-CM | POA: Insufficient documentation

## 2015-12-17 DIAGNOSIS — I509 Heart failure, unspecified: Secondary | ICD-10-CM | POA: Diagnosis not present

## 2015-12-17 DIAGNOSIS — E785 Hyperlipidemia, unspecified: Secondary | ICD-10-CM | POA: Diagnosis not present

## 2015-12-17 DIAGNOSIS — T82318A Breakdown (mechanical) of other vascular grafts, initial encounter: Secondary | ICD-10-CM | POA: Diagnosis not present

## 2015-12-17 DIAGNOSIS — I82290 Acute embolism and thrombosis of other thoracic veins: Secondary | ICD-10-CM | POA: Diagnosis not present

## 2015-12-17 DIAGNOSIS — E669 Obesity, unspecified: Secondary | ICD-10-CM | POA: Diagnosis not present

## 2015-12-17 DIAGNOSIS — Y841 Kidney dialysis as the cause of abnormal reaction of the patient, or of later complication, without mention of misadventure at the time of the procedure: Secondary | ICD-10-CM | POA: Diagnosis not present

## 2015-12-17 DIAGNOSIS — Z8249 Family history of ischemic heart disease and other diseases of the circulatory system: Secondary | ICD-10-CM | POA: Insufficient documentation

## 2015-12-17 DIAGNOSIS — N186 End stage renal disease: Secondary | ICD-10-CM | POA: Insufficient documentation

## 2015-12-17 DIAGNOSIS — T82868A Thrombosis of vascular prosthetic devices, implants and grafts, initial encounter: Secondary | ICD-10-CM | POA: Diagnosis not present

## 2015-12-17 DIAGNOSIS — I10 Essential (primary) hypertension: Secondary | ICD-10-CM | POA: Diagnosis not present

## 2015-12-17 DIAGNOSIS — I771 Stricture of artery: Secondary | ICD-10-CM | POA: Diagnosis not present

## 2015-12-17 HISTORY — PX: PERIPHERAL VASCULAR CATHETERIZATION: SHX172C

## 2015-12-17 SURGERY — DIALYSIS/PERMA CATHETER INSERTION
Anesthesia: Moderate Sedation

## 2015-12-17 MED ORDER — HYDROMORPHONE HCL 1 MG/ML IJ SOLN: 1.0000 mg | Freq: Once | INTRAMUSCULAR | Status: DC

## 2015-12-17 MED ORDER — SODIUM CHLORIDE 0.9 % IV SOLN
INTRAVENOUS | Status: DC
  Administered 2015-12-17: 13:00:00 via INTRAVENOUS

## 2015-12-17 MED ORDER — DEXTROSE 5 % IV SOLN
INTRAVENOUS | Status: AC
  Filled 2015-12-17 (×7): qty 1.5

## 2015-12-17 MED ORDER — FENTANYL CITRATE (PF) 100 MCG/2ML IJ SOLN
INTRAMUSCULAR | Status: AC
  Filled 2015-12-17: qty 2

## 2015-12-17 MED ORDER — HEPARIN SODIUM (PORCINE) 1000 UNIT/ML IJ SOLN
INTRAMUSCULAR | Status: AC
  Filled 2015-12-17: qty 1

## 2015-12-17 MED ORDER — FENTANYL CITRATE (PF) 100 MCG/2ML IJ SOLN
INTRAMUSCULAR | Status: AC
Start: 2015-12-17 — End: 2015-12-17
  Filled 2015-12-17: qty 2

## 2015-12-17 MED ORDER — HEPARIN (PORCINE) IN NACL 2-0.9 UNIT/ML-% IJ SOLN
INTRAMUSCULAR | Status: AC
  Filled 2015-12-17: qty 500

## 2015-12-17 MED ORDER — HEPARIN SODIUM (PORCINE) 10000 UNIT/ML IJ SOLN
INTRAMUSCULAR | Status: AC
  Filled 2015-12-17: qty 1

## 2015-12-17 MED ORDER — MIDAZOLAM HCL 5 MG/5ML IJ SOLN
INTRAMUSCULAR | Status: AC
  Filled 2015-12-17: qty 5

## 2015-12-17 MED ORDER — ONDANSETRON HCL 4 MG/2ML IJ SOLN: 4.0000 mg | Freq: Four times a day (QID) | INTRAMUSCULAR | Status: DC | PRN

## 2015-12-17 MED ORDER — MIDAZOLAM HCL 2 MG/2ML IJ SOLN
INTRAMUSCULAR | Status: DC | PRN
  Administered 2015-12-17: 1 mg via INTRAVENOUS
  Administered 2015-12-17: 2 mg via INTRAVENOUS
  Administered 2015-12-17: 1 mg via INTRAVENOUS
  Administered 2015-12-17: 2 mg via INTRAVENOUS

## 2015-12-17 MED ORDER — DEXTROSE 5 % IV SOLN: 1.5000 g | INTRAVENOUS | Status: DC

## 2015-12-17 MED ORDER — LIDOCAINE-EPINEPHRINE (PF) 1 %-1:200000 IJ SOLN
INTRAMUSCULAR | Status: AC
  Filled 2015-12-17: qty 20

## 2015-12-17 MED ORDER — MIDAZOLAM HCL 2 MG/2ML IJ SOLN
INTRAMUSCULAR | Status: AC
  Filled 2015-12-17: qty 2

## 2015-12-17 MED ORDER — FENTANYL CITRATE (PF) 100 MCG/2ML IJ SOLN
INTRAMUSCULAR | Status: DC | PRN
  Administered 2015-12-17 (×5): 50 ug via INTRAVENOUS

## 2015-12-17 SURGICAL SUPPLY — 12 items
ADH SKN CLS APL DERMABOND .7 (GAUZE/BANDAGES/DRESSINGS) ×1
BIOPATCH RED 1 DISK 7.0 (GAUZE/BANDAGES/DRESSINGS) ×2 IMPLANT
BIOPATCH RED 1IN DISK 7.0MM (GAUZE/BANDAGES/DRESSINGS) ×1
CATH PALINDROME RT-P 15FX23CM (CATHETERS) ×3 IMPLANT
DERMABOND ADVANCED (GAUZE/BANDAGES/DRESSINGS) ×2
DERMABOND ADVANCED .7 DNX12 (GAUZE/BANDAGES/DRESSINGS) ×1 IMPLANT
GLIDEWIRE ADV .035X180CM (WIRE) ×3 IMPLANT
PACK ANGIOGRAPHY (CUSTOM PROCEDURE TRAY) ×3 IMPLANT
SUT MNCRL AB 4-0 PS2 18 (SUTURE) ×3 IMPLANT
SUT PROLENE 0 CT 1 30 (SUTURE) ×3 IMPLANT
TOWEL OR 17X26 4PK STRL BLUE (TOWEL DISPOSABLE) ×3 IMPLANT
WIRE J 3MM .035X145CM (WIRE) ×3 IMPLANT

## 2015-12-17 NOTE — H&P (Signed)
  [facility] History & Physical Update  The patient was interviewed and re-examined.  His catheter has been dislodged and is no longer usable.  Otherwise, the patient's previous History and Physical has been reviewed and is unchanged.  There is no change in the plan of care. We plan to proceed with the scheduled procedure.  DEW,JASON, MD  12/17/2015, 1:00 PM

## 2015-12-17 NOTE — Op Note (Signed)
OPERATIVE NOTE    PRE-OPERATIVE DIAGNOSIS: 1. ESRD 2. Left innominate vein occlusion  POST-OPERATIVE DIAGNOSIS: same as above  PROCEDURE: 1. Ultrasound guidance for vascular access to the right external jugular vein 2. Jugular venogram and SVC gram 3. Fluoroscopic guidance for placement of catheter 4. Placement of a 23 cm tip to cuff tunneled hemodialysis catheter via the right jugular vein  SURGEON: Leotis Pain, MD  ANESTHESIA:  Local with Moderate conscious sedation for approximately 30 minutes using 6 mg of Versed and 200 mcg of Fentanyl  ESTIMATED BLOOD LOSS: 50 cc  FLUORO TIME: 1.5 minutes  CONTRAST: 5 cc  FINDING(S): 1.  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  SPECIMEN(S):  None  INDICATIONS:   [name] [name] is a 50 y.o. male who presents with ESRD and dislodgment of his previous permcath.  The patient needs long term dialysis access for their ESRD, and a Permcath is necessary.  Risks and benefits are discussed and informed consent is obtained.    DESCRIPTION: After obtaining full informed written consent, the patient was brought back to the vascular suited. The patient's right neck and chest were sterilely prepped and draped in a sterile surgical field was created. Moderate conscious sedation was administered during a face to face encounter with the patient throughout the procedure with my supervision of the RN administering medicines and monitoring the patient's vital signs, pulse oximetry, telemetry and mental status throughout from the start of the procedure until the patient was taken to the recovery room.  The right external jugular vein was visualized with ultrasound and found to be patent and large with a difficulty to visualize right IJ in the neck. The external jugular vein was then accessed under direct ultrasound guidance and a permanent image was recorded. A wire was  placed but a J wire wound not pass into the chest.  I placed a micropuncture sheath and performed a venogram for further evaluation.  This showed a large external jugular vein which joined the internal jugular vein at the clavicle.  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 The SVC was widely patent. Using this as a roadmap, I then passed an Advantage wire and removed the micropuncture sheath. After skin nick and dilatation, the peel-away sheath was placed over the wire and parked in the SVC. I then turned my attention to an area under the clavicle. Approximately 1-2 fingerbreadths below the clavicle a small counterincision was created and tunneled from the subclavicular incision to the access site. Using fluoroscopic guidance, a 23 centimeter tip to cuff tunneled hemodialysis catheter was selected, and tunneled from the subclavicular incision to the access site. It was then placed through the peel-away sheath and the peel-away sheath was removed. Using fluoroscopic guidance the catheter tips were parked in the right atrium. The appropriate distal connectors were placed. It withdrew blood well and flushed easily with heparinized saline and a concentrated heparin solution was then placed. It was secured to the chest wall with 2 Prolene sutures. The access incision was closed single 4-0 Monocryl. A 4-0 Monocryl pursestring suture was placed around the exit site. Sterile dressings were placed. The patient tolerated the procedure well and was taken to the recovery room in stable condition.  COMPLICATIONS: None  CONDITION: Stable  Cleveland Paiz  12/17/2015, 2:05 PM

## 2015-12-18 ENCOUNTER — Encounter: Payer: Self-pay | Admitting: Vascular Surgery

## 2015-12-22 ENCOUNTER — Ambulatory Visit
Admission: RE | Admit: 2015-12-22 | Discharge: 2015-12-22 | Disposition: A | Discharge status: Home / Self Care | Payer: Medicare Other | Source: Ambulatory Visit | Attending: Vascular Surgery | Admitting: Vascular Surgery

## 2015-12-22 ENCOUNTER — Other Ambulatory Visit (HOSPITAL_COMMUNITY)
Admission: AD | Admit: 2015-12-22 | Discharge: 2015-12-22 | Disposition: A | Discharge status: Home / Self Care | Payer: Medicare Other | Source: Skilled Nursing Facility | Attending: Nephrology | Admitting: Nephrology

## 2015-12-22 ENCOUNTER — Encounter
Admission: RE | Disposition: A | Discharge status: Home / Self Care | Payer: Self-pay | Source: Ambulatory Visit | Attending: Vascular Surgery

## 2015-12-22 ENCOUNTER — Other Ambulatory Visit: Payer: Self-pay | Admitting: Vascular Surgery

## 2015-12-22 ENCOUNTER — Encounter: Payer: Self-pay | Admitting: *Deleted

## 2015-12-22 DIAGNOSIS — Z992 Dependence on renal dialysis: Secondary | ICD-10-CM | POA: Diagnosis not present

## 2015-12-22 DIAGNOSIS — T8241XA Breakdown (mechanical) of vascular dialysis catheter, initial encounter: Secondary | ICD-10-CM | POA: Insufficient documentation

## 2015-12-22 DIAGNOSIS — E669 Obesity, unspecified: Secondary | ICD-10-CM | POA: Diagnosis not present

## 2015-12-22 DIAGNOSIS — Z8679 Personal history of other diseases of the circulatory system: Secondary | ICD-10-CM | POA: Insufficient documentation

## 2015-12-22 DIAGNOSIS — E785 Hyperlipidemia, unspecified: Secondary | ICD-10-CM | POA: Diagnosis not present

## 2015-12-22 DIAGNOSIS — E1122 Type 2 diabetes mellitus with diabetic chronic kidney disease: Secondary | ICD-10-CM | POA: Insufficient documentation

## 2015-12-22 DIAGNOSIS — Z87891 Personal history of nicotine dependence: Secondary | ICD-10-CM | POA: Diagnosis not present

## 2015-12-22 DIAGNOSIS — T82868A Thrombosis of vascular prosthetic devices, implants and grafts, initial encounter: Secondary | ICD-10-CM | POA: Diagnosis not present

## 2015-12-22 DIAGNOSIS — I509 Heart failure, unspecified: Secondary | ICD-10-CM | POA: Diagnosis not present

## 2015-12-22 DIAGNOSIS — T82598A Other mechanical complication of other cardiac and vascular devices and implants, initial encounter: Secondary | ICD-10-CM | POA: Diagnosis not present

## 2015-12-22 DIAGNOSIS — I2510 Atherosclerotic heart disease of native coronary artery without angina pectoris: Secondary | ICD-10-CM | POA: Diagnosis not present

## 2015-12-22 DIAGNOSIS — I132 Hypertensive heart and chronic kidney disease with heart failure and with stage 5 chronic kidney disease, or end stage renal disease: Secondary | ICD-10-CM | POA: Insufficient documentation

## 2015-12-22 DIAGNOSIS — N186 End stage renal disease: Secondary | ICD-10-CM | POA: Diagnosis not present

## 2015-12-22 DIAGNOSIS — I771 Stricture of artery: Secondary | ICD-10-CM | POA: Diagnosis not present

## 2015-12-22 DIAGNOSIS — E875 Hyperkalemia: Secondary | ICD-10-CM | POA: Diagnosis not present

## 2015-12-22 DIAGNOSIS — Y841 Kidney dialysis as the cause of abnormal reaction of the patient, or of later complication, without mention of misadventure at the time of the procedure: Secondary | ICD-10-CM | POA: Diagnosis not present

## 2015-12-22 DIAGNOSIS — T82318A Breakdown (mechanical) of other vascular grafts, initial encounter: Secondary | ICD-10-CM | POA: Diagnosis not present

## 2015-12-22 DIAGNOSIS — Z8249 Family history of ischemic heart disease and other diseases of the circulatory system: Secondary | ICD-10-CM | POA: Diagnosis not present

## 2015-12-22 DIAGNOSIS — Y838 Other surgical procedures as the cause of abnormal reaction of the patient, or of later complication, without mention of misadventure at the time of the procedure: Secondary | ICD-10-CM | POA: Insufficient documentation

## 2015-12-22 DIAGNOSIS — Z95828 Presence of other vascular implants and grafts: Secondary | ICD-10-CM | POA: Insufficient documentation

## 2015-12-22 DIAGNOSIS — I10 Essential (primary) hypertension: Secondary | ICD-10-CM | POA: Diagnosis not present

## 2015-12-22 HISTORY — PX: PERIPHERAL VASCULAR CATHETERIZATION: SHX172C

## 2015-12-22 LAB — POTASSIUM (ARMC VASCULAR LAB ONLY): Potassium (ARMC vascular lab): 5.9 — ABNORMAL HIGH (ref 3.5–5.1)

## 2015-12-22 LAB — POTASSIUM: POTASSIUM: 7.1 mmol/L — AB (ref 3.5–5.1)

## 2015-12-22 SURGERY — DIALYSIS/PERMA CATHETER INSERTION
Anesthesia: Moderate Sedation | Laterality: Left

## 2015-12-22 MED ORDER — IOPAMIDOL (ISOVUE-300) INJECTION 61%
INTRAVENOUS | Status: DC | PRN
  Administered 2015-12-22: 10 mL via INTRA_ARTERIAL

## 2015-12-22 MED ORDER — MIDAZOLAM HCL 2 MG/2ML IJ SOLN
INTRAMUSCULAR | Status: DC | PRN
  Administered 2015-12-22 (×3): 1 mg via INTRAVENOUS
  Administered 2015-12-22: 2 mg via INTRAVENOUS

## 2015-12-22 MED ORDER — HEPARIN SODIUM (PORCINE) 1000 UNIT/ML IJ SOLN
INTRAMUSCULAR | Status: AC
  Filled 2015-12-22: qty 1

## 2015-12-22 MED ORDER — HEPARIN SODIUM (PORCINE) 10000 UNIT/ML IJ SOLN
INTRAMUSCULAR | Status: AC
  Filled 2015-12-22: qty 1

## 2015-12-22 MED ORDER — SODIUM CHLORIDE 0.9 % IV SOLN
INTRAVENOUS | Status: DC
  Administered 2015-12-22: 16:00:00 via INTRAVENOUS

## 2015-12-22 MED ORDER — HYDROMORPHONE HCL 1 MG/ML IJ SOLN: 1.0000 mg | Freq: Once | INTRAMUSCULAR | Status: DC

## 2015-12-22 MED ORDER — FENTANYL CITRATE (PF) 100 MCG/2ML IJ SOLN
INTRAMUSCULAR | Status: DC | PRN
  Administered 2015-12-22 (×2): 50 ug via INTRAVENOUS
  Administered 2015-12-22: 100 ug via INTRAVENOUS

## 2015-12-22 MED ORDER — FENTANYL CITRATE (PF) 100 MCG/2ML IJ SOLN
INTRAMUSCULAR | Status: AC
  Filled 2015-12-22: qty 4

## 2015-12-22 MED ORDER — ONDANSETRON HCL 4 MG/2ML IJ SOLN: 4.0000 mg | Freq: Four times a day (QID) | INTRAMUSCULAR | Status: DC | PRN

## 2015-12-22 MED ORDER — MIDAZOLAM HCL 5 MG/5ML IJ SOLN
INTRAMUSCULAR | Status: AC
  Filled 2015-12-22: qty 5

## 2015-12-22 MED ORDER — HEPARIN (PORCINE) IN NACL 2-0.9 UNIT/ML-% IJ SOLN
INTRAMUSCULAR | Status: AC
  Filled 2015-12-22: qty 500

## 2015-12-22 MED ORDER — HEPARIN SODIUM (PORCINE) 1000 UNIT/ML IJ SOLN
INTRAMUSCULAR | Status: DC | PRN
  Administered 2015-12-22: 3000 [IU] via INTRAVENOUS

## 2015-12-22 MED ORDER — DEXTROSE 5 % IV SOLN
1.5000 g | INTRAVENOUS | Status: AC
  Administered 2015-12-22: 1.5 g via INTRAVENOUS

## 2015-12-22 MED ORDER — LIDOCAINE-EPINEPHRINE (PF) 1 %-1:200000 IJ SOLN
INTRAMUSCULAR | Status: AC
  Filled 2015-12-22: qty 20

## 2015-12-22 SURGICAL SUPPLY — 12 items
BALLN DORADO 10X80X80 (BALLOONS) ×3
BALLOON DORADO 10X80X80 (BALLOONS) ×1 IMPLANT
CATH PALINDROME-P 23CM W/VT (CATHETERS) ×3 IMPLANT
DEVICE PRESTO INFLATION (MISCELLANEOUS) ×3 IMPLANT
DEVICE TORQUE (MISCELLANEOUS) ×3 IMPLANT
DIALYSIS CATH PAL 28 888814504 (CATHETERS) IMPLANT
GLIDEWIRE STIFF .35X180X3 HYDR (WIRE) ×3 IMPLANT
GUIDEWIRE SUPER STIFF .035X180 (WIRE) ×3 IMPLANT
INTRODUCER PERFORM 12 30 .038 (SHEATH) ×3 IMPLANT
PACK ANGIOGRAPHY (CUSTOM PROCEDURE TRAY) ×3 IMPLANT
SHEATH ANSEL 9 FR 45CM (SHEATH) ×3 IMPLANT
TOWEL OR 17X26 4PK STRL BLUE (TOWEL DISPOSABLE) ×3 IMPLANT

## 2015-12-22 NOTE — H&P (Signed)
[facility] Admission History & Physical  MRN : [id number]  [name] [name] is a 50 y.o. ([birth year]) male who presents with chief complaint of my catheter is not working.  History of Present Illness: [name] [name] is sent by Dr. Hedy Camara 4 nonfunction of his catheter. His catheter has recently been exchanged twice. He has not had dialysis since last Tuesday. At the present time he denies shortness of breath. His potassium has been checked and is within the acceptable limits. He denies fever chills.  Current Facility-Administered Medications  Medication Dose Route Frequency Provider Last Rate Last Dose  . 0.9 %  sodium chloride infusion   Intravenous Continuous Kimberly A Stegmayer, PA-C 10 mL/hr at 12/22/15 1541    . cefUROXime (ZINACEF) 1.5 g in dextrose 5 % 50 mL IVPB  1.5 g Intravenous 30 min Pre-Op Kimberly A Stegmayer, PA-C      . fentaNYL (SUBLIMAZE) injection    PRN Katha Cabal, MD   100 mcg at 12/22/15 1735  . HYDROmorphone (DILAUDID) injection 1 mg  1 mg Intravenous Once [employer], PA-C      . midazolam (VERSED) injection    PRN Katha Cabal, MD   2 mg at 12/22/15 1735  . ondansetron (ZOFRAN) injection 4 mg  4 mg Intravenous Q6H PRN Sela Hua, PA-C        Past Medical History:  Diagnosis Date  . Aneurysm ([place])     Right arm fistula 3 aneurysms 2011,   plans to have a new procedure  . Angina    occasional, last 6 mo ago  . Chest discomfort     2008  . CHF (congestive heart failure) ([place])   . Dialysis patient [facility])    M-W-F @ [place]  . Ejection fraction     65%, echo, 2008   /   EF 55-60%, echo, October 28, 2010  . ESRD (end stage renal disease) ([place])    on hemodialysis [id number]_S  . Hypertension   . Leg pain   . Orthostatic hypotension     2008  . Overweight(278.02)   . Sinus tachycardia ([place])     2008, TSH normal  . Syncope     positional after dialysis... 2008    Past Surgical History:  Procedure Laterality Date  .  ARTERIOVENOUS GRAFT PLACEMENT    . AV FISTULA PLACEMENT    . AV FISTULA PLACEMENT Left 11/18/2015   Procedure: ARTERIOVENOUS (AV) FISTULA CREATION ( RADIOCEPHALIC );  Surgeon: Algernon Huxley, MD;  Location: [facility];  Service: Vascular;  Laterality: Left;  . HERNIA REPAIR    . INSERTION OF DIALYSIS CATHETER  03/01/2011   Procedure: INSERTION OF DIALYSIS CATHETER;  Surgeon: Elam Dutch, MD;  Location: [place];  Service: Vascular;  Laterality: Left;  Exchange of Dialysis Catheter to 27cm 15Fr. Arrow Catheter  . PERIPHERAL VASCULAR CATHETERIZATION N/A 09/01/2014   Procedure: A/V Shuntogram/Fistulagram;  Surgeon: Algernon Huxley, MD;  Location: [place] [facility];  Service: Cardiovascular;  Laterality: N/A;  . PERIPHERAL VASCULAR CATHETERIZATION Right 09/01/2014   Procedure: Thrombectomy;  Surgeon: Algernon Huxley, MD;  Location: [place] [facility];  Service: Cardiovascular;  Laterality: Right;  . PERIPHERAL VASCULAR CATHETERIZATION Right 09/01/2014   Procedure: A/V Shunt Intervention;  Surgeon: Algernon Huxley, MD;  Location: [place] [facility];  Service: Cardiovascular;  Laterality: Right;  . PERIPHERAL VASCULAR CATHETERIZATION N/A 09/17/2014   Procedure: A/V Shuntogram/Fistulagram;  Surgeon: Algernon Huxley, MD;  Location: [place] [facility];  Service: Cardiovascular;  Laterality: N/A;  . PERIPHERAL VASCULAR CATHETERIZATION Right 10/17/2014   Procedure: Thrombectomy;  Surgeon: Katha Cabal, MD;  Location: [place] [facility];  Service: Cardiovascular;  Laterality: Right;  . PERIPHERAL VASCULAR CATHETERIZATION N/A 10/17/2014   Procedure: A/V Shuntogram/Fistulagram;  Surgeon: Katha Cabal, MD;  Location: [place] [facility];  Service: Cardiovascular;  Laterality: N/A;  . PERIPHERAL VASCULAR CATHETERIZATION N/A 10/17/2014   Procedure: A/V Shunt Intervention;  Surgeon: Katha Cabal, MD;  Location: [place] [facility];  Service: Cardiovascular;  Laterality: N/A;  . PERIPHERAL VASCULAR CATHETERIZATION  Right 11/04/2014   Procedure: Thrombectomy;  Surgeon: Katha Cabal, MD;  Location: [place] [facility];  Service: Cardiovascular;  Laterality: Right;  . PERIPHERAL VASCULAR CATHETERIZATION Left 11/04/2014   Procedure: Visceral Venography;  Surgeon: Katha Cabal, MD;  Location: [place] [facility];  Service: Cardiovascular;  Laterality: Left;  . PERIPHERAL VASCULAR CATHETERIZATION Right 11/27/2014   Procedure: A/V Shuntogram/Fistulagram;  Surgeon: Algernon Huxley, MD;  Location: [place] [facility];  Service: Cardiovascular;  Laterality: Right;  . PERIPHERAL VASCULAR CATHETERIZATION Right 11/27/2014   Procedure: A/V Shunt Intervention;  Surgeon: Algernon Huxley, MD;  Location: [place] [facility];  Service: Cardiovascular;  Laterality: Right;  . PERIPHERAL VASCULAR CATHETERIZATION Right 12/31/2014   Procedure: Thrombectomy;  Surgeon: Algernon Huxley, MD;  Location: [place] [facility];  Service: Cardiovascular;  Laterality: Right;  . PERIPHERAL VASCULAR CATHETERIZATION Right 01/12/2015   Procedure: A/V Shuntogram/Fistulagram;  Surgeon: Algernon Huxley, MD;  Location: [place] [facility];  Service: Cardiovascular;  Laterality: Right;  . PERIPHERAL VASCULAR CATHETERIZATION N/A 01/12/2015   Procedure: A/V Shunt Intervention;  Surgeon: Algernon Huxley, MD;  Location: [place] [facility];  Service: Cardiovascular;  Laterality: N/A;  . PERIPHERAL VASCULAR CATHETERIZATION Right 01/28/2015   Procedure: Thrombectomy;  Surgeon: Algernon Huxley, MD;  Location: [place] [facility];  Service: Cardiovascular;  Laterality: Right;  . PERIPHERAL VASCULAR CATHETERIZATION N/A 06/08/2015   Procedure: A/V Shuntogram/Fistulagram;  Surgeon: Algernon Huxley, MD;  Location: [place] [facility];  Service: Cardiovascular;  Laterality: N/A;  . PERIPHERAL VASCULAR CATHETERIZATION N/A 06/08/2015   Procedure: A/V Shunt Intervention;  Surgeon: Algernon Huxley, MD;  Location: [place] [facility];  Service: Cardiovascular;  Laterality: N/A;  .  PERIPHERAL VASCULAR CATHETERIZATION Right 07/06/2015   Procedure: A/V Shuntogram/Fistulagram;  Surgeon: Algernon Huxley, MD;  Location: [place] [facility];  Service: Cardiovascular;  Laterality: Right;  . PERIPHERAL VASCULAR CATHETERIZATION N/A 07/06/2015   Procedure: A/V Shunt Intervention;  Surgeon: Algernon Huxley, MD;  Location: [place] [facility];  Service: Cardiovascular;  Laterality: N/A;  . PERIPHERAL VASCULAR CATHETERIZATION N/A 08/04/2015   Procedure: graft declot;  Surgeon: Katha Cabal, MD;  Location: [place] [facility];  Service: Cardiovascular;  Laterality: N/A;  . PERIPHERAL VASCULAR CATHETERIZATION Right 08/25/2015   Procedure: A/V Shuntogram/Fistulagram;  Surgeon: Katha Cabal, MD;  Location: [place] [facility];  Service: Cardiovascular;  Laterality: Right;  . PERIPHERAL VASCULAR CATHETERIZATION N/A 11/04/2015   Procedure: Dialysis/Perma Catheter Insertion;  Surgeon: Algernon Huxley, MD;  Location: [place] [facility];  Service: Cardiovascular;  Laterality: N/A;  . PERIPHERAL VASCULAR CATHETERIZATION Left 12/14/2015   Procedure: A/V Shuntogram/Fistulagram;  Surgeon: Algernon Huxley, MD;  Location: [place] [facility];  Service: Cardiovascular;  Laterality: Left;  . PERIPHERAL VASCULAR CATHETERIZATION N/A 12/14/2015   Procedure: A/V Shunt Intervention;  Surgeon:  Algernon Huxley, MD;  Location: [place] [facility];  Service: Cardiovascular;  Laterality: N/A;  . PERIPHERAL VASCULAR CATHETERIZATION N/A 12/17/2015   Procedure: Dialysis/Perma Catheter Insertion;  Surgeon: Algernon Huxley, MD;  Location: [place] [facility];  Service: Cardiovascular;  Laterality: N/A;  . right arm graft     for dyalisis  . THROMBECTOMY    . THROMBECTOMY W/ EMBOLECTOMY  03/01/2011   Procedure: THROMBECTOMY ARTERIOVENOUS GORE-TEX GRAFT;  Surgeon: Elam Dutch, MD;  Location: [place];  Service: Vascular;  Laterality: Right;  Attempted Thrombectomy of Old  Right Upper Arm Arteriovenous gortex Graft. Insertion of new  Arteriovenous Graft using 46mm x 50cm Gortex Stretch graft.   . THROMBECTOMY W/ EMBOLECTOMY  07/11/2011   Procedure: THROMBECTOMY ARTERIOVENOUS GORE-TEX GRAFT;  Surgeon: Rosetta Posner, MD;  Location: [place];  Service: Vascular;  Laterality: Right;  . UMBILICAL HERNIA REPAIR    . VENOGRAM N/A 08/23/2011   Procedure: VENOGRAM;  Surgeon: [name], MD;  Location: [facility];  Service: Cardiovascular;  Laterality: N/A;    Social History Social History  Substance Use Topics  . Smoking status: Former Smoker    Types: Cigarettes    Quit date: 04/18/1994  . Smokeless tobacco: Current User    Types: Snuff     Comment: dips 1/2 snuff per day times 25 years  . Alcohol use No    Family History Family History  Problem Relation Age of Onset  . Hypertension Mother   . Heart disease Mother   . Hypertension Father   . Heart disease Father   No family history of bleeding clotting disorders porphyria or autoimmune disease  No Known Allergies   REVIEW OF SYSTEMS (Negative unless checked)  Constitutional: [] Weight loss  [] Fever  [] Chills Cardiac: [] Chest pain   [] Chest pressure   [] Palpitations   [] Shortness of breath when laying flat   [] Shortness of breath at rest   [] Shortness of breath with exertion. Vascular:  [] Pain in legs with walking   [] Pain in legs at rest   [] Pain in legs when laying flat   [] Claudication   [] Pain in feet when walking  [] Pain in feet at rest  [] Pain in feet when laying flat   [] History of DVT   [] Phlebitis   [] Swelling in legs   [] Varicose veins   [] Non-healing ulcers Pulmonary:   [] Uses home oxygen   [] Productive cough   [] Hemoptysis   [] Wheeze  [] COPD   [] Asthma Neurologic:  [] Dizziness  [] Blackouts   [] Seizures   [] History of stroke   [] History of TIA  [] Aphasia   [] Temporary blindness   [] Dysphagia   [] Weakness or numbness in arms   [] Weakness or numbness in legs Musculoskeletal:  [] Arthritis   [] Joint swelling   [] Joint pain   [] Low back pain Hematologic:  [] Easy  bruising  [] Easy bleeding   [] Hypercoagulable state   [] Anemic  [] Hepatitis Gastrointestinal:  [] Blood in stool   [] Vomiting blood  [] Gastroesophageal reflux/heartburn   [] Difficulty swallowing. Genitourinary:  [x] Chronic kidney disease   [] Difficult urination  [] Frequent urination  [] Burning with urination   [] Blood in urine Skin:  [] Rashes   [] Ulcers   [] Wounds Psychological:  [] History of anxiety   []  History of major depression.  Physical Examination  Vitals:   12/22/15 1508 12/22/15 1512  BP: 121/86 121/86  Pulse:  86  Resp: 14 16  Temp: 97.9 F (36.6 C)   TempSrc: Oral   SpO2: 94% 96%  Weight: (!) 157 kg (  346 lb 2 oz)   Height: 6' (1.829 m)    Body mass index is 46.94 kg/m. Gen: WD/WN, NAD Head: [employer]/AT, No temporalis wasting. Prominent temp pulse not noted. Ear/Nose/Throat: Hearing grossly intact, nares w/o erythema or drainage, oropharynx w/o Erythema/Exudate,  Eyes: PERRLA, EOMI.  Neck: Supple, no nuchal rigidity.  No bruit or JVD.  Pulmonary:  Good air movement, clear to auscultation bilaterally, no use of accessory muscles.  Cardiac: RRR, normal S1, S2, no Murmurs, rubs or gallops. Vascular: Right IJ tunneled catheter clean and intact; scars bilateral upper extremities consistent with multiple previous dialysis access operations Vessel Right Left  Radial Palpable Palpable  Ulnar Palpable Palpable  Brachial Palpable Palpable  Carotid Palpable, without bruit Palpable, without bruit  Aorta Not palpable N/A  Femoral Palpable Palpable  Popliteal Palpable Palpable  PT Palpable Palpable  DP Palpable Palpable   Gastrointestinal: soft, non-tender/non-distended. No guarding/reflex.  Musculoskeletal: M/S 5/5 throughout.  Extremities without ischemic changes.  No deformity or atrophy.  Neurologic: CN 2-12 intact. Pain and light touch intact in extremities.  Symmetrical.  Speech is fluent. Motor exam as listed above. Psychiatric: Judgment intact, Mood & affect appropriate for  pt's clinical situation. Dermatologic: No rashes or ulcers noted.  No cellulitis or open wounds. Lymph : No Cervical, Axillary, or Inguinal lymphadenopathy.   CBC Lab Results  Component Value Date   WBC 9.4 11/13/2015   HGB 16.3 11/18/2015   HCT 48.0 11/18/2015   MCV 76.0 (L) 11/13/2015   PLT 308 11/13/2015    BMET    Component Value Date/Time   NA 138 11/18/2015 1210   NA 137 11/13/2013 0408   K 7.1 (HH) 12/22/2015 1200   K 4.1 11/13/2013 0408   CL 101 11/13/2015 0949   CL 98 11/13/2013 0408   CO2 25 11/13/2015 0949   CO2 25 11/13/2013 0408   GLUCOSE 83 11/18/2015 1210   GLUCOSE 64 (L) 11/13/2013 0408   BUN 31 (H) 11/13/2015 0949   BUN 68 (H) 11/13/2013 0408   CREATININE 10.59 (H) 11/13/2015 0949   CREATININE 16.45 (H) 11/13/2013 0408   CALCIUM 8.6 (L) 11/13/2015 0949   CALCIUM 6.8 (LL) 11/13/2013 0408   GFRNONAA 5 (L) 11/13/2015 0949   GFRNONAA 3 (L) 11/13/2013 0408   GFRAA 6 (L) 11/13/2015 0949   GFRAA 3 (L) 11/13/2013 0408   CrCl cannot be calculated (Patient's most recent lab result is older than the maximum 21 days allowed.).  COAG Lab Results  Component Value Date   INR 1.25 11/13/2015   INR 1.05 08/08/2012   INR 0.98 06/01/2011    Radiology No results found.  Assessment/Plan 1.  Complication dialysis device with thrombosis AV access:  Patient's tunneled catheter dialysis access is thrombosed. The patient will undergo catheter exchange using interventional techniques. Potassium will be drawn to ensure that it is an appropriate level prior to performing thrombectomy. 2.  End-stage renal disease requiring hemodialysis:  Patient will continue dialysis therapy without further interruption. Dialysis has already been arranged since the patient missed their previous session 3.  Hypertension:  Patient will continue medical management; nephrology is following no changes in oral medications. 4. Diabetes mellitus:  Glucose will be monitored and oral medications  been held this morning once the patient has undergone the patient's procedure po intake will be reinitiated and again Accu-Cheks will be used to assess the blood glucose level and treat as needed. The patient will be restarted on the patient's usual hypoglycemic regime 5.  Coronary artery  disease:  EKG will be monitored. Nitrates will be used if needed. The patient's oral cardiac medications will be continued.     Donevin Sainsbury, Dolores Lory, MD  12/22/2015 5:36 PM

## 2015-12-22 NOTE — Op Note (Deleted)
OPERATIVE NOTE   PROCEDURE: 1.  2. End stage renal disease requiring hemodialysis  PRE-OPERATIVE DIAGNOSIS: Complication of dialysis access; Thrombosed left arm brachial axillary dialysis graft                                                        End Stage Renal Disease   POST-OPERATIVE DIAGNOSIS: same as above   SURGEON: Katha Cabal, M.D.  ANESTHESIA: Conscious Sedation   ESTIMATED BLOOD LOSS: minimal  FINDING(S): Thrombosed brachial axillary dialysis graft with string sign at the venous anastomosis   SPECIMEN(S):  None  CONTRAST: 25 cc  FLUOROSCOPY TIME: 3.3 minutes  INDICATIONS: [name] [name] is a 49 y.o. male who  presents with malfunctioning left arm brachial axillary AV access.  The patient is scheduled for angiography with possible intervention of the AV access.  The patient is aware the risks include but are not limited to: bleeding, infection, thrombosis of the cannulated access, and possible anaphylactic reaction to the contrast.  The patient acknowledges if the access can not be salvaged a tunneled catheter will be needed and will be placed during this procedure.  The patient is aware of the risks of the procedure and elects to proceed with the angiogram and intervention.  DESCRIPTION: After full informed written consent was obtained, the patient was brought back to the Special Procedure suite and placed supine position.  Appropriate cardiopulmonary monitors were placed.  The left arm was prepped and draped in the standard fashion.  Appropriate timeout is called. The left arm brachial axillary graft  was cannulated with a micropuncture needle under ultrasound guidance. The graft was evaluated on ultrasound was noted to have echolucent material in it consistent with thrombosis. Image was recorded for the permanent record. Puncture was made under direct ultrasonography. The microwire was advanced and the needle was exchanged for  a microsheath.  The J-wire was then  advanced and a 6 Fr sheath inserted.  Hand was then performed which demonstrated thrombus within the AV access.  The central venous structures were also imaged by hand injections.  4000 units of heparin was given and allowed to circulate as well.  A Trerotola device was then advanced beginning centrally and pulling back performing mechanical thrombectomy.  Several passes were made through the venous portion of the graft. Follow-up imaging now demonstrates the vast majority of the clot had been treated. Therefore a retrograde sheath was inserted. This too was a 6 [place] sheath was positioned more proximally on the arm and angled in the retrograde direction. The antegrade sheath was then upsized to a 7 [place]. Subsequently a floppy Glidewire and a KMP catheter were negotiated into the arterial system hand injection contrast was then utilized to demonstrate patency of the artery as well as the location for the anastomosis. The Trerotola device was now advanced through the retrograde sheath was extended out into the artery the basket was opened and it was slowly pulled back into the graft and then the basket was engaged. Several passes were made on the arterial portion and pulsatility of the access was reestablished. Follow-up imaging demonstrates there was now thrombus in the venous portion surrounding the sheath and this was treated with the Trerotola device from the antegrade direction. After several passes imaging demonstrated resolution of thrombus within the graft and forward flow however  stricture of the graft was noted area  Magic torque wire was then advanced through the antegrade sheath and an 8 x 50 flared Flair stent was deployed across the venous stricture at the venous anastomosis. An 8 x 6 Dorado balloon was used to angioplasty the venous portion of the AV access. The inflation was performed with inflation times with 30 seconds to 1 minute with maximum pressures of 20 ATM.  With the balloon  inflated reflux of contrast was performed demonstrating the arterial anastomosis. The arterial anastomosis was free of residual thrombus. Contrast was then injected in the forward direction demonstrating rapid flow no evidence of any residual thrombus was identified central venous anatomy was preserved.  A 4-0 Monocryl purse-string suture was sewn around both of the sheaths.  The sheaths were removed and light pressure was applied.  A sterile bandage was applied to the puncture site.    COMPLICATIONS: None  CONDITION: [name] [name], M.D [facility] Office: [phone]  12/22/2015 3:50 PM

## 2015-12-22 NOTE — H&P (Signed)
  [facility] History & Physical Update  The patient was interviewed and re-examined.  The patient's previous History and Physical has been reviewed and is unchanged.  There is no change in the plan of care. We plan to proceed with the scheduled procedure.  Tacy Chavis, Dolores Lory, MD  12/22/2015, 5:35 PM

## 2015-12-22 NOTE — Progress Notes (Signed)
MD in to see patient and wife. D/C instructions given and patient verbalized his understanding. Patient to lobby via wheelchair for transport to home.

## 2015-12-22 NOTE — Op Note (Addendum)
[facility]   OPERATIVE NOTE     PROCEDURE: 1. Exchange right IJ tunneled dialysis catheter over wire same access 2. Contrast injection superior vena cava 3. Balloon angioplasty to 10 mm right innominate vein   PRE-OPERATIVE DIAGNOSIS: Complication of dialysis device with nonfunction of tunneled catheter; end-stage renal requiring hemodialysis  POST-OPERATIVE DIAGNOSIS: same as above  SURGEON: Katha Cabal, M.D.  ANESTHESIA: Conscious sedation was administered under my direct supervision. IV Versed plus fentanyl were utilized. Continuous ECG, pulse oximetry and blood pressure was monitored throughout the entire procedure.  Versed and Fentanyl were utilized.  Conscious sedation was for a total of 40 minutes.  ESTIMATED BLOOD LOSS: Minimal  FINDING(S): 1.  Tips of the catheter in the right atrium on fluoroscopy 2.  No obvious pneumothorax on fluoroscopy  SPECIMEN(S):  none  INDICATIONS:   [name] [name] is a 50 y.o. male  presents with end stage renal disease.  Therefore, the patient requires a tunneled dialysis catheter placement.  The patient is informed of  the risks catheter placement include but are not limited to: bleeding, infection, central venous injury, pneumothorax, possible venous stenosis, possible malpositioning in the venous system, and possible infections related to long-term catheter presence.  The patient was aware of these risks and agreed to proceed.  DESCRIPTION: The patient was taken back to Special Procedure suite.  Prior to sedation, the patient was given IV antibiotics.  After obtaining adequate sedation, the patient was prepped and draped in the standard fashion for a chest or neck tunneled dialysis catheter placement.  Appropriate Time Out is called.   The the right neck and chest wall are then infiltrated with 1% Lidocaine with epinepherine.  A 27 catheter is then selected, opened on the back table and prepped.  The cuff of the  existing catheter is then freed from surrounding attachments. The catheter is then controlled with a hemostat and transected above the level of the cuff.  Under fluoroscopy an Amplatz Super Stiff wire is introduced through the catheter and negotiated into the atrium. The catheter is then pulled back so that the tip is now at the level of the clavicle and using the second lumen hand injection of contrast is then performed demonstrating the proximal right internal jugular vein as well as the innominate vein superior vena cava and right atrium. 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 3000 units of heparin is given. Based on the venogram it does appear that the Amplatz wire is trapped behind a fibrin sheath and/or scar tissue given this observation I introduced a stiff angle Glidewire through the second lumen and using the venogram as a roadmap negotiated that wire into the superior vena cava and then through the atrium and into the inferior vena cava keeping the wire in the largest portion of the flow lumen. The remaining portion of the catheter is then removed without difficulty. A 9 French Ansell sheath was then advanced over the Glidewire and positioned with its tip in the atrium.  The venogram repeated by hand injection. The Amplatz wire was then introduced through the 9 French sheath and the Glidewire removed. A 10 x 6 Dorado balloon was then advanced across the venous stricture and inflated to 16 atm for 1 minute. Follow-up imaging demonstrated a marked improvement in the flow and highlighted the right atrium quite well.  As noted above the 27 cm tip to cuff catheter was selected and now the Ansell sheath was removed and the tunneled  catheter advanced. Unfortunately this catheter was too long and a 23 cm tip to cuff catheter was opened and advanced over the wire without difficulty and positioned with the tips centered in the right atrium.  Each port was tested by aspirating and flushing.  No  resistance was noted.  Each port was then thoroughly flushed with heparinized saline.  The catheter was secured in placed with two interrupted stitches of 0 silk tied to the catheter.   Each port was then packed with concentrated heparin (10,000 Units/mL) at the manufacturer recommended volumes to each port.  Sterile caps were applied to each port.  On completion fluoroscopy, the tips of the catheter were in the right atrium, and there was no evidence of pneumothorax.  COMPLICATIONS: None  CONDITION: [name] [name] 12/22/2015,6:21 PM [facility] Office: [phone]   12/22/2015, 6:21 PM

## 2015-12-23 ENCOUNTER — Encounter: Payer: Self-pay | Admitting: Vascular Surgery

## 2015-12-23 DIAGNOSIS — N2581 Secondary hyperparathyroidism of renal origin: Secondary | ICD-10-CM | POA: Diagnosis not present

## 2015-12-23 DIAGNOSIS — Z23 Encounter for immunization: Secondary | ICD-10-CM | POA: Diagnosis not present

## 2015-12-23 DIAGNOSIS — E611 Iron deficiency: Secondary | ICD-10-CM | POA: Diagnosis not present

## 2015-12-23 DIAGNOSIS — Z992 Dependence on renal dialysis: Secondary | ICD-10-CM | POA: Diagnosis not present

## 2015-12-23 DIAGNOSIS — N186 End stage renal disease: Secondary | ICD-10-CM | POA: Diagnosis not present

## 2015-12-24 DIAGNOSIS — N2581 Secondary hyperparathyroidism of renal origin: Secondary | ICD-10-CM | POA: Diagnosis not present

## 2015-12-24 DIAGNOSIS — Z992 Dependence on renal dialysis: Secondary | ICD-10-CM | POA: Diagnosis not present

## 2015-12-24 DIAGNOSIS — E611 Iron deficiency: Secondary | ICD-10-CM | POA: Diagnosis not present

## 2015-12-24 DIAGNOSIS — Z23 Encounter for immunization: Secondary | ICD-10-CM | POA: Diagnosis not present

## 2015-12-24 DIAGNOSIS — N186 End stage renal disease: Secondary | ICD-10-CM | POA: Diagnosis not present

## 2015-12-26 DIAGNOSIS — Z992 Dependence on renal dialysis: Secondary | ICD-10-CM | POA: Diagnosis not present

## 2015-12-26 DIAGNOSIS — N2581 Secondary hyperparathyroidism of renal origin: Secondary | ICD-10-CM | POA: Diagnosis not present

## 2015-12-26 DIAGNOSIS — Z23 Encounter for immunization: Secondary | ICD-10-CM | POA: Diagnosis not present

## 2015-12-26 DIAGNOSIS — N186 End stage renal disease: Secondary | ICD-10-CM | POA: Diagnosis not present

## 2015-12-26 DIAGNOSIS — E611 Iron deficiency: Secondary | ICD-10-CM | POA: Diagnosis not present

## 2015-12-28 DIAGNOSIS — E611 Iron deficiency: Secondary | ICD-10-CM | POA: Diagnosis not present

## 2015-12-28 DIAGNOSIS — Z23 Encounter for immunization: Secondary | ICD-10-CM | POA: Diagnosis not present

## 2015-12-28 DIAGNOSIS — N2581 Secondary hyperparathyroidism of renal origin: Secondary | ICD-10-CM | POA: Diagnosis not present

## 2015-12-28 DIAGNOSIS — N186 End stage renal disease: Secondary | ICD-10-CM | POA: Diagnosis not present

## 2015-12-28 DIAGNOSIS — Z992 Dependence on renal dialysis: Secondary | ICD-10-CM | POA: Diagnosis not present

## 2015-12-31 DIAGNOSIS — Z992 Dependence on renal dialysis: Secondary | ICD-10-CM | POA: Diagnosis not present

## 2015-12-31 DIAGNOSIS — N2581 Secondary hyperparathyroidism of renal origin: Secondary | ICD-10-CM | POA: Diagnosis not present

## 2015-12-31 DIAGNOSIS — N186 End stage renal disease: Secondary | ICD-10-CM | POA: Diagnosis not present

## 2015-12-31 DIAGNOSIS — Z23 Encounter for immunization: Secondary | ICD-10-CM | POA: Diagnosis not present

## 2015-12-31 DIAGNOSIS — E611 Iron deficiency: Secondary | ICD-10-CM | POA: Diagnosis not present

## 2016-01-02 DIAGNOSIS — N186 End stage renal disease: Secondary | ICD-10-CM | POA: Diagnosis not present

## 2016-01-02 DIAGNOSIS — Z23 Encounter for immunization: Secondary | ICD-10-CM | POA: Diagnosis not present

## 2016-01-02 DIAGNOSIS — Z992 Dependence on renal dialysis: Secondary | ICD-10-CM | POA: Diagnosis not present

## 2016-01-02 DIAGNOSIS — N2581 Secondary hyperparathyroidism of renal origin: Secondary | ICD-10-CM | POA: Diagnosis not present

## 2016-01-02 DIAGNOSIS — E611 Iron deficiency: Secondary | ICD-10-CM | POA: Diagnosis not present

## 2016-01-05 DIAGNOSIS — E611 Iron deficiency: Secondary | ICD-10-CM | POA: Diagnosis not present

## 2016-01-05 DIAGNOSIS — Z23 Encounter for immunization: Secondary | ICD-10-CM | POA: Diagnosis not present

## 2016-01-05 DIAGNOSIS — N186 End stage renal disease: Secondary | ICD-10-CM | POA: Diagnosis not present

## 2016-01-05 DIAGNOSIS — Z992 Dependence on renal dialysis: Secondary | ICD-10-CM | POA: Diagnosis not present

## 2016-01-05 DIAGNOSIS — N2581 Secondary hyperparathyroidism of renal origin: Secondary | ICD-10-CM | POA: Diagnosis not present

## 2016-01-07 DIAGNOSIS — N2581 Secondary hyperparathyroidism of renal origin: Secondary | ICD-10-CM | POA: Diagnosis not present

## 2016-01-07 DIAGNOSIS — Z992 Dependence on renal dialysis: Secondary | ICD-10-CM | POA: Diagnosis not present

## 2016-01-07 DIAGNOSIS — N186 End stage renal disease: Secondary | ICD-10-CM | POA: Diagnosis not present

## 2016-01-07 DIAGNOSIS — E611 Iron deficiency: Secondary | ICD-10-CM | POA: Diagnosis not present

## 2016-01-07 DIAGNOSIS — Z23 Encounter for immunization: Secondary | ICD-10-CM | POA: Diagnosis not present

## 2016-01-09 DIAGNOSIS — N2581 Secondary hyperparathyroidism of renal origin: Secondary | ICD-10-CM | POA: Diagnosis not present

## 2016-01-09 DIAGNOSIS — N186 End stage renal disease: Secondary | ICD-10-CM | POA: Diagnosis not present

## 2016-01-09 DIAGNOSIS — Z992 Dependence on renal dialysis: Secondary | ICD-10-CM | POA: Diagnosis not present

## 2016-01-09 DIAGNOSIS — E611 Iron deficiency: Secondary | ICD-10-CM | POA: Diagnosis not present

## 2016-01-09 DIAGNOSIS — Z23 Encounter for immunization: Secondary | ICD-10-CM | POA: Diagnosis not present

## 2016-01-11 DIAGNOSIS — T82598A Other mechanical complication of other cardiac and vascular devices and implants, initial encounter: Secondary | ICD-10-CM | POA: Diagnosis not present

## 2016-01-11 DIAGNOSIS — T82318A Breakdown (mechanical) of other vascular grafts, initial encounter: Secondary | ICD-10-CM | POA: Diagnosis not present

## 2016-01-11 DIAGNOSIS — T82868A Thrombosis of vascular prosthetic devices, implants and grafts, initial encounter: Secondary | ICD-10-CM | POA: Diagnosis not present

## 2016-01-11 DIAGNOSIS — I771 Stricture of artery: Secondary | ICD-10-CM | POA: Diagnosis not present

## 2016-01-11 DIAGNOSIS — Z992 Dependence on renal dialysis: Secondary | ICD-10-CM | POA: Diagnosis not present

## 2016-01-11 DIAGNOSIS — I10 Essential (primary) hypertension: Secondary | ICD-10-CM | POA: Diagnosis not present

## 2016-01-11 DIAGNOSIS — E669 Obesity, unspecified: Secondary | ICD-10-CM | POA: Diagnosis not present

## 2016-01-11 DIAGNOSIS — E785 Hyperlipidemia, unspecified: Secondary | ICD-10-CM | POA: Diagnosis not present

## 2016-01-11 DIAGNOSIS — N186 End stage renal disease: Secondary | ICD-10-CM | POA: Diagnosis not present

## 2016-01-11 DIAGNOSIS — Y841 Kidney dialysis as the cause of abnormal reaction of the patient, or of later complication, without mention of misadventure at the time of the procedure: Secondary | ICD-10-CM | POA: Diagnosis not present

## 2016-01-12 DIAGNOSIS — N2581 Secondary hyperparathyroidism of renal origin: Secondary | ICD-10-CM | POA: Diagnosis not present

## 2016-01-12 DIAGNOSIS — E611 Iron deficiency: Secondary | ICD-10-CM | POA: Diagnosis not present

## 2016-01-12 DIAGNOSIS — Z992 Dependence on renal dialysis: Secondary | ICD-10-CM | POA: Diagnosis not present

## 2016-01-12 DIAGNOSIS — Z23 Encounter for immunization: Secondary | ICD-10-CM | POA: Diagnosis not present

## 2016-01-12 DIAGNOSIS — N186 End stage renal disease: Secondary | ICD-10-CM | POA: Diagnosis not present

## 2016-01-14 DIAGNOSIS — N2581 Secondary hyperparathyroidism of renal origin: Secondary | ICD-10-CM | POA: Diagnosis not present

## 2016-01-14 DIAGNOSIS — Z23 Encounter for immunization: Secondary | ICD-10-CM | POA: Diagnosis not present

## 2016-01-14 DIAGNOSIS — N186 End stage renal disease: Secondary | ICD-10-CM | POA: Diagnosis not present

## 2016-01-14 DIAGNOSIS — Z992 Dependence on renal dialysis: Secondary | ICD-10-CM | POA: Diagnosis not present

## 2016-01-14 DIAGNOSIS — E611 Iron deficiency: Secondary | ICD-10-CM | POA: Diagnosis not present

## 2016-01-16 DIAGNOSIS — Z992 Dependence on renal dialysis: Secondary | ICD-10-CM | POA: Diagnosis not present

## 2016-01-16 DIAGNOSIS — N2581 Secondary hyperparathyroidism of renal origin: Secondary | ICD-10-CM | POA: Diagnosis not present

## 2016-01-16 DIAGNOSIS — Z23 Encounter for immunization: Secondary | ICD-10-CM | POA: Diagnosis not present

## 2016-01-16 DIAGNOSIS — N186 End stage renal disease: Secondary | ICD-10-CM | POA: Diagnosis not present

## 2016-01-16 DIAGNOSIS — E611 Iron deficiency: Secondary | ICD-10-CM | POA: Diagnosis not present

## 2016-01-19 DIAGNOSIS — D509 Iron deficiency anemia, unspecified: Secondary | ICD-10-CM | POA: Diagnosis not present

## 2016-01-19 DIAGNOSIS — Z992 Dependence on renal dialysis: Secondary | ICD-10-CM | POA: Diagnosis not present

## 2016-01-19 DIAGNOSIS — E611 Iron deficiency: Secondary | ICD-10-CM | POA: Diagnosis not present

## 2016-01-19 DIAGNOSIS — N186 End stage renal disease: Secondary | ICD-10-CM | POA: Diagnosis not present

## 2016-01-19 DIAGNOSIS — N2581 Secondary hyperparathyroidism of renal origin: Secondary | ICD-10-CM | POA: Diagnosis not present

## 2016-01-21 DIAGNOSIS — N2581 Secondary hyperparathyroidism of renal origin: Secondary | ICD-10-CM | POA: Diagnosis not present

## 2016-01-21 DIAGNOSIS — Z992 Dependence on renal dialysis: Secondary | ICD-10-CM | POA: Diagnosis not present

## 2016-01-21 DIAGNOSIS — E611 Iron deficiency: Secondary | ICD-10-CM | POA: Diagnosis not present

## 2016-01-21 DIAGNOSIS — N186 End stage renal disease: Secondary | ICD-10-CM | POA: Diagnosis not present

## 2016-01-21 DIAGNOSIS — D509 Iron deficiency anemia, unspecified: Secondary | ICD-10-CM | POA: Diagnosis not present

## 2016-01-23 DIAGNOSIS — N2581 Secondary hyperparathyroidism of renal origin: Secondary | ICD-10-CM | POA: Diagnosis not present

## 2016-01-23 DIAGNOSIS — E611 Iron deficiency: Secondary | ICD-10-CM | POA: Diagnosis not present

## 2016-01-23 DIAGNOSIS — D509 Iron deficiency anemia, unspecified: Secondary | ICD-10-CM | POA: Diagnosis not present

## 2016-01-23 DIAGNOSIS — Z992 Dependence on renal dialysis: Secondary | ICD-10-CM | POA: Diagnosis not present

## 2016-01-23 DIAGNOSIS — N186 End stage renal disease: Secondary | ICD-10-CM | POA: Diagnosis not present

## 2016-01-26 DIAGNOSIS — E611 Iron deficiency: Secondary | ICD-10-CM | POA: Diagnosis not present

## 2016-01-26 DIAGNOSIS — N2581 Secondary hyperparathyroidism of renal origin: Secondary | ICD-10-CM | POA: Diagnosis not present

## 2016-01-26 DIAGNOSIS — D509 Iron deficiency anemia, unspecified: Secondary | ICD-10-CM | POA: Diagnosis not present

## 2016-01-26 DIAGNOSIS — N186 End stage renal disease: Secondary | ICD-10-CM | POA: Diagnosis not present

## 2016-01-26 DIAGNOSIS — Z992 Dependence on renal dialysis: Secondary | ICD-10-CM | POA: Diagnosis not present

## 2016-01-28 DIAGNOSIS — Z992 Dependence on renal dialysis: Secondary | ICD-10-CM | POA: Diagnosis not present

## 2016-01-28 DIAGNOSIS — N2581 Secondary hyperparathyroidism of renal origin: Secondary | ICD-10-CM | POA: Diagnosis not present

## 2016-01-28 DIAGNOSIS — D509 Iron deficiency anemia, unspecified: Secondary | ICD-10-CM | POA: Diagnosis not present

## 2016-01-28 DIAGNOSIS — E611 Iron deficiency: Secondary | ICD-10-CM | POA: Diagnosis not present

## 2016-01-28 DIAGNOSIS — N186 End stage renal disease: Secondary | ICD-10-CM | POA: Diagnosis not present

## 2016-01-30 DIAGNOSIS — E611 Iron deficiency: Secondary | ICD-10-CM | POA: Diagnosis not present

## 2016-01-30 DIAGNOSIS — Z992 Dependence on renal dialysis: Secondary | ICD-10-CM | POA: Diagnosis not present

## 2016-01-30 DIAGNOSIS — D509 Iron deficiency anemia, unspecified: Secondary | ICD-10-CM | POA: Diagnosis not present

## 2016-01-30 DIAGNOSIS — N186 End stage renal disease: Secondary | ICD-10-CM | POA: Diagnosis not present

## 2016-01-30 DIAGNOSIS — N2581 Secondary hyperparathyroidism of renal origin: Secondary | ICD-10-CM | POA: Diagnosis not present

## 2016-02-02 DIAGNOSIS — N2581 Secondary hyperparathyroidism of renal origin: Secondary | ICD-10-CM | POA: Diagnosis not present

## 2016-02-02 DIAGNOSIS — E611 Iron deficiency: Secondary | ICD-10-CM | POA: Diagnosis not present

## 2016-02-02 DIAGNOSIS — D509 Iron deficiency anemia, unspecified: Secondary | ICD-10-CM | POA: Diagnosis not present

## 2016-02-02 DIAGNOSIS — Z992 Dependence on renal dialysis: Secondary | ICD-10-CM | POA: Diagnosis not present

## 2016-02-02 DIAGNOSIS — N186 End stage renal disease: Secondary | ICD-10-CM | POA: Diagnosis not present

## 2016-02-04 DIAGNOSIS — E611 Iron deficiency: Secondary | ICD-10-CM | POA: Diagnosis not present

## 2016-02-04 DIAGNOSIS — N2581 Secondary hyperparathyroidism of renal origin: Secondary | ICD-10-CM | POA: Diagnosis not present

## 2016-02-04 DIAGNOSIS — Z992 Dependence on renal dialysis: Secondary | ICD-10-CM | POA: Diagnosis not present

## 2016-02-04 DIAGNOSIS — D509 Iron deficiency anemia, unspecified: Secondary | ICD-10-CM | POA: Diagnosis not present

## 2016-02-04 DIAGNOSIS — N186 End stage renal disease: Secondary | ICD-10-CM | POA: Diagnosis not present

## 2016-02-05 ENCOUNTER — Encounter (INDEPENDENT_AMBULATORY_CARE_PROVIDER_SITE_OTHER): Payer: Self-pay

## 2016-02-06 DIAGNOSIS — N2581 Secondary hyperparathyroidism of renal origin: Secondary | ICD-10-CM | POA: Diagnosis not present

## 2016-02-06 DIAGNOSIS — E611 Iron deficiency: Secondary | ICD-10-CM | POA: Diagnosis not present

## 2016-02-06 DIAGNOSIS — Z992 Dependence on renal dialysis: Secondary | ICD-10-CM | POA: Diagnosis not present

## 2016-02-06 DIAGNOSIS — D509 Iron deficiency anemia, unspecified: Secondary | ICD-10-CM | POA: Diagnosis not present

## 2016-02-06 DIAGNOSIS — N186 End stage renal disease: Secondary | ICD-10-CM | POA: Diagnosis not present

## 2016-02-08 ENCOUNTER — Other Ambulatory Visit (INDEPENDENT_AMBULATORY_CARE_PROVIDER_SITE_OTHER): Payer: Self-pay | Admitting: Vascular Surgery

## 2016-02-09 DIAGNOSIS — N186 End stage renal disease: Secondary | ICD-10-CM | POA: Diagnosis not present

## 2016-02-09 DIAGNOSIS — N2581 Secondary hyperparathyroidism of renal origin: Secondary | ICD-10-CM | POA: Diagnosis not present

## 2016-02-09 DIAGNOSIS — E611 Iron deficiency: Secondary | ICD-10-CM | POA: Diagnosis not present

## 2016-02-09 DIAGNOSIS — Z992 Dependence on renal dialysis: Secondary | ICD-10-CM | POA: Diagnosis not present

## 2016-02-09 DIAGNOSIS — D509 Iron deficiency anemia, unspecified: Secondary | ICD-10-CM | POA: Diagnosis not present

## 2016-02-11 ENCOUNTER — Encounter
Admission: RE | Disposition: A | Discharge status: Home / Self Care | Payer: Self-pay | Source: Ambulatory Visit | Attending: Vascular Surgery

## 2016-02-11 ENCOUNTER — Ambulatory Visit
Admission: RE | Admit: 2016-02-11 | Discharge: 2016-02-11 | Disposition: A | Discharge status: Home / Self Care | Payer: Medicare Other | Source: Ambulatory Visit | Attending: Vascular Surgery | Admitting: Vascular Surgery

## 2016-02-11 ENCOUNTER — Encounter: Payer: Self-pay | Admitting: *Deleted

## 2016-02-11 DIAGNOSIS — I132 Hypertensive heart and chronic kidney disease with heart failure and with stage 5 chronic kidney disease, or end stage renal disease: Secondary | ICD-10-CM | POA: Diagnosis not present

## 2016-02-11 DIAGNOSIS — Z79899 Other long term (current) drug therapy: Secondary | ICD-10-CM | POA: Insufficient documentation

## 2016-02-11 DIAGNOSIS — Z7982 Long term (current) use of aspirin: Secondary | ICD-10-CM | POA: Insufficient documentation

## 2016-02-11 DIAGNOSIS — I509 Heart failure, unspecified: Secondary | ICD-10-CM | POA: Diagnosis not present

## 2016-02-11 DIAGNOSIS — Z87891 Personal history of nicotine dependence: Secondary | ICD-10-CM | POA: Insufficient documentation

## 2016-02-11 DIAGNOSIS — Z992 Dependence on renal dialysis: Secondary | ICD-10-CM | POA: Diagnosis not present

## 2016-02-11 DIAGNOSIS — Z7901 Long term (current) use of anticoagulants: Secondary | ICD-10-CM | POA: Insufficient documentation

## 2016-02-11 DIAGNOSIS — N186 End stage renal disease: Secondary | ICD-10-CM | POA: Diagnosis not present

## 2016-02-11 DIAGNOSIS — T829XXA Unspecified complication of cardiac and vascular prosthetic device, implant and graft, initial encounter: Secondary | ICD-10-CM | POA: Insufficient documentation

## 2016-02-11 LAB — POTASSIUM (ARMC VASCULAR LAB ONLY): POTASSIUM (ARMC VASCULAR LAB): 5.4 — AB (ref 3.5–5.1)

## 2016-02-11 SURGERY — A/V SHUNTOGRAM/FISTULAGRAM
Anesthesia: Moderate Sedation | Laterality: Left

## 2016-02-11 MED ORDER — LIDOCAINE-EPINEPHRINE (PF) 1 %-1:200000 IJ SOLN
INTRAMUSCULAR | Status: AC
  Filled 2016-02-11: qty 30

## 2016-02-11 MED ORDER — SODIUM CHLORIDE 0.9 % IV SOLN
INTRAVENOUS | Status: DC
  Administered 2016-02-11: 10:00:00 via INTRAVENOUS

## 2016-02-11 MED ORDER — HEPARIN (PORCINE) IN NACL 2-0.9 UNIT/ML-% IJ SOLN
INTRAMUSCULAR | Status: AC
  Filled 2016-02-11: qty 1000

## 2016-02-11 MED ORDER — METHYLPREDNISOLONE SODIUM SUCC 125 MG IJ SOLR: 125.0000 mg | INTRAMUSCULAR | Status: DC | PRN

## 2016-02-11 MED ORDER — DEXTROSE 5 % IV SOLN: 1.5000 g | INTRAVENOUS | Status: DC

## 2016-02-11 MED ORDER — FAMOTIDINE 20 MG PO TABS: 40.0000 mg | ORAL_TABLET | ORAL | Status: DC | PRN

## 2016-02-11 MED ORDER — HYDROMORPHONE HCL 1 MG/ML IJ SOLN: 1.0000 mg | Freq: Once | INTRAMUSCULAR | Status: DC

## 2016-02-11 MED ORDER — ONDANSETRON HCL 4 MG/2ML IJ SOLN: 4.0000 mg | Freq: Four times a day (QID) | INTRAMUSCULAR | Status: DC | PRN

## 2016-02-11 NOTE — OR Nursing (Signed)
Pt verbalized concerns over fistulagram.  Discussed concerns with dr. Lucky Cowboy.  Dr. Lucky Cowboy performed bedside ultrasound of his fistula and marked arm for cannulation sites.  Faxed note to [facility] regarding cannulation of access.  Phoned davida to verify they received the note.

## 2016-02-11 NOTE — H&P (Addendum)
[facility] Admission History & Physical  MRN : [id number]  [name] [name] is a 50 y.o. (12/24/1965) male who presents with chief complaint of No chief complaint on file. [name]  History of Present Illness: The patient is here today for a fistulogram. He has a recently placed AV fistula which she says is working reasonably well but the access center is having a difficult time accessing. This may need a surgical revision secondary to depth. He reports no fevers or chills. He has no specific complaints today.  Current Facility-Administered Medications  Medication Dose Route Frequency Provider Last Rate Last Dose  . 0.9 %  sodium chloride infusion   Intravenous Continuous Kimberly A Stegmayer, PA-C      . cefUROXime (ZINACEF) 1.5 g in dextrose 5 % 50 mL IVPB  1.5 g Intravenous 30 min Pre-Op Kimberly A Stegmayer, PA-C      . famotidine (PEPCID) tablet 40 mg  40 mg Oral PRN Janalyn Harder Stegmayer, PA-C      . HYDROmorphone (DILAUDID) injection 1 mg  1 mg Intravenous Once [employer], PA-C      . methylPREDNISolone sodium succinate (SOLU-MEDROL) 125 mg/2 mL injection 125 mg  125 mg Intravenous PRN Kimberly A Stegmayer, PA-C      . ondansetron (ZOFRAN) injection 4 mg  4 mg Intravenous Q6H PRN Sela Hua, PA-C        Past Medical History:  Diagnosis Date  . Aneurysm ([place])     Right arm fistula 3 aneurysms 2011,   plans to have a new procedure  . Angina    occasional, last 6 mo ago  . Chest discomfort     2008  . CHF (congestive heart failure) ([place])   . Dialysis patient [facility])    M-W-F @ [place]  . Ejection fraction     65%, echo, 2008   /   EF 55-60%, echo, October 28, 2010  . ESRD (end stage renal disease) ([place])    on hemodialysis [id number]_S  . Hypertension   . Leg pain   . Orthostatic hypotension     2008  . Overweight(278.02)   . Sinus tachycardia     2008, TSH normal  . Syncope     positional after dialysis... 2008    Past Surgical History:   Procedure Laterality Date  . ARTERIOVENOUS GRAFT PLACEMENT    . AV FISTULA PLACEMENT    . AV FISTULA PLACEMENT Left 11/18/2015   Procedure: ARTERIOVENOUS (AV) FISTULA CREATION ( RADIOCEPHALIC );  Surgeon: Algernon Huxley, MD;  Location: [facility];  Service: Vascular;  Laterality: Left;  . HERNIA REPAIR    . INSERTION OF DIALYSIS CATHETER  03/01/2011   Procedure: INSERTION OF DIALYSIS CATHETER;  Surgeon: Elam Dutch, MD;  Location: [place];  Service: Vascular;  Laterality: Left;  Exchange of Dialysis Catheter to 27cm 15Fr. Arrow Catheter  . PERIPHERAL VASCULAR CATHETERIZATION N/A 09/01/2014   Procedure: A/V Shuntogram/Fistulagram;  Surgeon: Algernon Huxley, MD;  Location: [place] [facility];  Service: Cardiovascular;  Laterality: N/A;  . PERIPHERAL VASCULAR CATHETERIZATION Right 09/01/2014   Procedure: Thrombectomy;  Surgeon: Algernon Huxley, MD;  Location: [place] [facility];  Service: Cardiovascular;  Laterality: Right;  . PERIPHERAL VASCULAR CATHETERIZATION Right 09/01/2014   Procedure: A/V Shunt Intervention;  Surgeon: Algernon Huxley, MD;  Location: [place] [facility];  Service: Cardiovascular;  Laterality: Right;  . PERIPHERAL VASCULAR CATHETERIZATION N/A 09/17/2014   Procedure: A/V Shuntogram/Fistulagram;  Surgeon:  Algernon Huxley, MD;  Location: [place] [facility];  Service: Cardiovascular;  Laterality: N/A;  . PERIPHERAL VASCULAR CATHETERIZATION Right 10/17/2014   Procedure: Thrombectomy;  Surgeon: Katha Cabal, MD;  Location: [place] [facility];  Service: Cardiovascular;  Laterality: Right;  . PERIPHERAL VASCULAR CATHETERIZATION N/A 10/17/2014   Procedure: A/V Shuntogram/Fistulagram;  Surgeon: Katha Cabal, MD;  Location: [place] [facility];  Service: Cardiovascular;  Laterality: N/A;  . PERIPHERAL VASCULAR CATHETERIZATION N/A 10/17/2014   Procedure: A/V Shunt Intervention;  Surgeon: Katha Cabal, MD;  Location: [place] [facility];  Service: Cardiovascular;  Laterality: N/A;  .  PERIPHERAL VASCULAR CATHETERIZATION Right 11/04/2014   Procedure: Thrombectomy;  Surgeon: Katha Cabal, MD;  Location: [place] [facility];  Service: Cardiovascular;  Laterality: Right;  . PERIPHERAL VASCULAR CATHETERIZATION Left 11/04/2014   Procedure: Visceral Venography;  Surgeon: Katha Cabal, MD;  Location: [place] [facility];  Service: Cardiovascular;  Laterality: Left;  . PERIPHERAL VASCULAR CATHETERIZATION Right 11/27/2014   Procedure: A/V Shuntogram/Fistulagram;  Surgeon: Algernon Huxley, MD;  Location: [place] [facility];  Service: Cardiovascular;  Laterality: Right;  . PERIPHERAL VASCULAR CATHETERIZATION Right 11/27/2014   Procedure: A/V Shunt Intervention;  Surgeon: Algernon Huxley, MD;  Location: [place] [facility];  Service: Cardiovascular;  Laterality: Right;  . PERIPHERAL VASCULAR CATHETERIZATION Right 12/31/2014   Procedure: Thrombectomy;  Surgeon: Algernon Huxley, MD;  Location: [place] [facility];  Service: Cardiovascular;  Laterality: Right;  . PERIPHERAL VASCULAR CATHETERIZATION Right 01/12/2015   Procedure: A/V Shuntogram/Fistulagram;  Surgeon: Algernon Huxley, MD;  Location: [place] [facility];  Service: Cardiovascular;  Laterality: Right;  . PERIPHERAL VASCULAR CATHETERIZATION N/A 01/12/2015   Procedure: A/V Shunt Intervention;  Surgeon: Algernon Huxley, MD;  Location: [place] [facility];  Service: Cardiovascular;  Laterality: N/A;  . PERIPHERAL VASCULAR CATHETERIZATION Right 01/28/2015   Procedure: Thrombectomy;  Surgeon: Algernon Huxley, MD;  Location: [place] [facility];  Service: Cardiovascular;  Laterality: Right;  . PERIPHERAL VASCULAR CATHETERIZATION N/A 06/08/2015   Procedure: A/V Shuntogram/Fistulagram;  Surgeon: Algernon Huxley, MD;  Location: [place] [facility];  Service: Cardiovascular;  Laterality: N/A;  . PERIPHERAL VASCULAR CATHETERIZATION N/A 06/08/2015   Procedure: A/V Shunt Intervention;  Surgeon: Algernon Huxley, MD;  Location: [place] [facility];  Service:  Cardiovascular;  Laterality: N/A;  . PERIPHERAL VASCULAR CATHETERIZATION Right 07/06/2015   Procedure: A/V Shuntogram/Fistulagram;  Surgeon: Algernon Huxley, MD;  Location: [place] [facility];  Service: Cardiovascular;  Laterality: Right;  . PERIPHERAL VASCULAR CATHETERIZATION N/A 07/06/2015   Procedure: A/V Shunt Intervention;  Surgeon: Algernon Huxley, MD;  Location: [place] [facility];  Service: Cardiovascular;  Laterality: N/A;  . PERIPHERAL VASCULAR CATHETERIZATION N/A 08/04/2015   Procedure: graft declot;  Surgeon: Katha Cabal, MD;  Location: [place] [facility];  Service: Cardiovascular;  Laterality: N/A;  . PERIPHERAL VASCULAR CATHETERIZATION Right 08/25/2015   Procedure: A/V Shuntogram/Fistulagram;  Surgeon: Katha Cabal, MD;  Location: [place] [facility];  Service: Cardiovascular;  Laterality: Right;  . PERIPHERAL VASCULAR CATHETERIZATION N/A 11/04/2015   Procedure: Dialysis/Perma Catheter Insertion;  Surgeon: Algernon Huxley, MD;  Location: [place] [facility];  Service: Cardiovascular;  Laterality: N/A;  . PERIPHERAL VASCULAR CATHETERIZATION Left 12/14/2015   Procedure: A/V Shuntogram/Fistulagram;  Surgeon: Algernon Huxley, MD;  Location: [place] [facility];  Service: Cardiovascular;  Laterality: Left;  . PERIPHERAL VASCULAR CATHETERIZATION N/A 12/14/2015   Procedure:  A/V Shunt Intervention;  Surgeon: Algernon Huxley, MD;  Location: [place] [facility];  Service: Cardiovascular;  Laterality: N/A;  . PERIPHERAL VASCULAR CATHETERIZATION N/A 12/17/2015   Procedure: Dialysis/Perma Catheter Insertion;  Surgeon: Algernon Huxley, MD;  Location: [place] [facility];  Service: Cardiovascular;  Laterality: N/A;  . PERIPHERAL VASCULAR CATHETERIZATION Left 12/22/2015   Procedure: Dialysis/Perma Catheter Insertion;  Surgeon: Katha Cabal, MD;  Location: [place] [facility];  Service: Cardiovascular;  Laterality: Left;  . right arm graft     for dyalisis  . THROMBECTOMY    . THROMBECTOMY W/ EMBOLECTOMY   03/01/2011   Procedure: THROMBECTOMY ARTERIOVENOUS GORE-TEX GRAFT;  Surgeon: Elam Dutch, MD;  Location: [place];  Service: Vascular;  Laterality: Right;  Attempted Thrombectomy of Old  Right Upper Arm Arteriovenous gortex Graft. Insertion of new Arteriovenous Graft using 50mm x 50cm Gortex Stretch graft.   . THROMBECTOMY W/ EMBOLECTOMY  07/11/2011   Procedure: THROMBECTOMY ARTERIOVENOUS GORE-TEX GRAFT;  Surgeon: Rosetta Posner, MD;  Location: [place];  Service: Vascular;  Laterality: Right;  . UMBILICAL HERNIA REPAIR    . VENOGRAM N/A 08/23/2011   Procedure: VENOGRAM;  Surgeon: [name], MD;  Location: [facility];  Service: Cardiovascular;  Laterality: N/A;    Social History Social History  Substance Use Topics  . Smoking status: Former Smoker    Types: Cigarettes    Quit date: 04/18/1994  . Smokeless tobacco: Current User    Types: Snuff     Comment: dips 1/2 snuff per day times 25 years  . Alcohol use No    Family History Family History  Problem Relation Age of Onset  . Hypertension Mother   . Heart disease Mother   . Hypertension Father   . Heart disease Father     No Known Allergies   REVIEW OF SYSTEMS (Negative unless checked)  Constitutional: [] Weight loss  [] Fever  [] Chills Cardiac: [] Chest pain   [] Chest pressure   [] Palpitations   [] Shortness of breath when laying flat   [] Shortness of breath at rest   [] Shortness of breath with exertion. Vascular:  [] Pain in legs with walking   [] Pain in legs at rest   [] Pain in legs when laying flat   [] Claudication   [] Pain in feet when walking  [] Pain in feet at rest  [] Pain in feet when laying flat   [] History of DVT   [] Phlebitis   [] Swelling in legs   [] Varicose veins   [] Non-healing ulcers Pulmonary:   [] Uses home oxygen   [] Productive cough   [] Hemoptysis   [] Wheeze  [] COPD   [] Asthma Neurologic:  [] Dizziness  [] Blackouts   [] Seizures   [] History of stroke   [] History of TIA  [] Aphasia   [] Temporary blindness   [] Dysphagia    [] Weakness or numbness in arms   [] Weakness or numbness in legs Musculoskeletal:  [] Arthritis   [] Joint swelling   [] Joint pain   [] Low back pain Hematologic:  [] Easy bruising  [] Easy bleeding   [] Hypercoagulable state   [] Anemic  [] Hepatitis Gastrointestinal:  [] Blood in stool   [] Vomiting blood  [] Gastroesophageal reflux/heartburn   [] Difficulty swallowing. Genitourinary:  [x] Chronic kidney disease   [] Difficult urination  [] Frequent urination  [] Burning with urination   [] Blood in urine Skin:  [] Rashes   [] Ulcers   [] Wounds Psychological:  [] History of anxiety   []  History of major depression.  Physical Examination  There were no vitals filed for this visit. There is no height or  weight on file to calculate BMI. Gen: WD/WN, NAD Head: [place]/AT, No temporalis wasting. Prominent temp pulse not noted. Ear/Nose/Throat: Hearing grossly intact, nares w/o erythema or drainage, oropharynx w/o Erythema/Exudate,  Eyes: Conjunctiva clear, sclera non-icteric Neck: Trachea midline.  No JVD.  Pulmonary:  Good air movement, respirations not labored, no use of accessory muscles.  Cardiac: RRR, normal S1, S2. Vascular: Fistula and thrill present and AV fistula Vessel Right Left  Radial Palpable Palpable                                   Gastrointestinal: soft, non-tender/non-distended. No guarding/reflex.  Musculoskeletal: M/S 5/5 throughout.  Extremities without ischemic changes.  No deformity or atrophy.  Neurologic: Sensation grossly intact in extremities.  Symmetrical.  Speech is fluent. Motor exam as listed above. Psychiatric: Judgment intact, Mood & affect appropriate for pt's clinical situation. Dermatologic: No rashes or ulcers noted.  No cellulitis or open wounds. Lymph : No Cervical, Axillary, or Inguinal lymphadenopathy.     CBC Lab Results  Component Value Date   WBC 9.4 11/13/2015   HGB 16.3 11/18/2015   HCT 48.0 11/18/2015   MCV 76.0 (L) 11/13/2015   PLT 308 11/13/2015     BMET    Component Value Date/Time   NA 138 11/18/2015 1210   NA 137 11/13/2013 0408   K 7.1 (HH) 12/22/2015 1200   K 4.1 11/13/2013 0408   CL 101 11/13/2015 0949   CL 98 11/13/2013 0408   CO2 25 11/13/2015 0949   CO2 25 11/13/2013 0408   GLUCOSE 83 11/18/2015 1210   GLUCOSE 64 (L) 11/13/2013 0408   BUN 31 (H) 11/13/2015 0949   BUN 68 (H) 11/13/2013 0408   CREATININE 10.59 (H) 11/13/2015 0949   CREATININE 16.45 (H) 11/13/2013 0408   CALCIUM 8.6 (L) 11/13/2015 0949   CALCIUM 6.8 (LL) 11/13/2013 0408   GFRNONAA 5 (L) 11/13/2015 0949   GFRNONAA 3 (L) 11/13/2013 0408   GFRAA 6 (L) 11/13/2015 0949   GFRAA 3 (L) 11/13/2013 0408   CrCl cannot be calculated (Unknown ideal weight.).  COAG Lab Results  Component Value Date   INR 1.25 11/13/2015   INR 1.05 08/08/2012   INR 0.98 06/01/2011    Radiology No results found.   Assessment/Plan 1. Dysfunction of dialysis access. Consideration for surgical revision has been requested by the dialysis center and a fistulogram will be performed for further evaluation prior to that. 2. End-stage renal disease. Access issues as above.    Leotis Pain, MD  02/11/2016 9:38 AM   On discussion with the patient, the access center had not yet tried to stick his access. I did a hand-held ultrasound at the bedside which showed a reasonably sized fistula without any evidence of stenosis that was obvious and Adaptic that was moderate. I would recommend that the access center try to access this at least 2-3 times before any surgical revision or further treatment be performed. No fistulogram will be performed today.

## 2016-02-13 DIAGNOSIS — N2581 Secondary hyperparathyroidism of renal origin: Secondary | ICD-10-CM | POA: Diagnosis not present

## 2016-02-13 DIAGNOSIS — N186 End stage renal disease: Secondary | ICD-10-CM | POA: Diagnosis not present

## 2016-02-13 DIAGNOSIS — Z992 Dependence on renal dialysis: Secondary | ICD-10-CM | POA: Diagnosis not present

## 2016-02-13 DIAGNOSIS — E611 Iron deficiency: Secondary | ICD-10-CM | POA: Diagnosis not present

## 2016-02-13 DIAGNOSIS — D509 Iron deficiency anemia, unspecified: Secondary | ICD-10-CM | POA: Diagnosis not present

## 2016-02-16 DIAGNOSIS — D509 Iron deficiency anemia, unspecified: Secondary | ICD-10-CM | POA: Diagnosis not present

## 2016-02-16 DIAGNOSIS — N2581 Secondary hyperparathyroidism of renal origin: Secondary | ICD-10-CM | POA: Diagnosis not present

## 2016-02-16 DIAGNOSIS — N186 End stage renal disease: Secondary | ICD-10-CM | POA: Diagnosis not present

## 2016-02-16 DIAGNOSIS — Z992 Dependence on renal dialysis: Secondary | ICD-10-CM | POA: Diagnosis not present

## 2016-02-16 DIAGNOSIS — E611 Iron deficiency: Secondary | ICD-10-CM | POA: Diagnosis not present

## 2016-02-18 DIAGNOSIS — E611 Iron deficiency: Secondary | ICD-10-CM | POA: Diagnosis not present

## 2016-02-18 DIAGNOSIS — N2581 Secondary hyperparathyroidism of renal origin: Secondary | ICD-10-CM | POA: Diagnosis not present

## 2016-02-18 DIAGNOSIS — N186 End stage renal disease: Secondary | ICD-10-CM | POA: Diagnosis not present

## 2016-02-18 DIAGNOSIS — Z992 Dependence on renal dialysis: Secondary | ICD-10-CM | POA: Diagnosis not present

## 2016-02-20 DIAGNOSIS — N186 End stage renal disease: Secondary | ICD-10-CM | POA: Diagnosis not present

## 2016-02-20 DIAGNOSIS — Z992 Dependence on renal dialysis: Secondary | ICD-10-CM | POA: Diagnosis not present

## 2016-02-20 DIAGNOSIS — N2581 Secondary hyperparathyroidism of renal origin: Secondary | ICD-10-CM | POA: Diagnosis not present

## 2016-02-20 DIAGNOSIS — E611 Iron deficiency: Secondary | ICD-10-CM | POA: Diagnosis not present

## 2016-02-23 DIAGNOSIS — N2581 Secondary hyperparathyroidism of renal origin: Secondary | ICD-10-CM | POA: Diagnosis not present

## 2016-02-23 DIAGNOSIS — N186 End stage renal disease: Secondary | ICD-10-CM | POA: Diagnosis not present

## 2016-02-23 DIAGNOSIS — E611 Iron deficiency: Secondary | ICD-10-CM | POA: Diagnosis not present

## 2016-02-23 DIAGNOSIS — Z992 Dependence on renal dialysis: Secondary | ICD-10-CM | POA: Diagnosis not present

## 2016-02-25 ENCOUNTER — Encounter (INDEPENDENT_AMBULATORY_CARE_PROVIDER_SITE_OTHER): Payer: Self-pay

## 2016-02-25 ENCOUNTER — Other Ambulatory Visit (INDEPENDENT_AMBULATORY_CARE_PROVIDER_SITE_OTHER): Payer: Self-pay | Admitting: Vascular Surgery

## 2016-02-25 DIAGNOSIS — N186 End stage renal disease: Secondary | ICD-10-CM | POA: Diagnosis not present

## 2016-02-25 DIAGNOSIS — N2581 Secondary hyperparathyroidism of renal origin: Secondary | ICD-10-CM | POA: Diagnosis not present

## 2016-02-25 DIAGNOSIS — E611 Iron deficiency: Secondary | ICD-10-CM | POA: Diagnosis not present

## 2016-02-25 DIAGNOSIS — Z992 Dependence on renal dialysis: Secondary | ICD-10-CM | POA: Diagnosis not present

## 2016-02-27 DIAGNOSIS — N2581 Secondary hyperparathyroidism of renal origin: Secondary | ICD-10-CM | POA: Diagnosis not present

## 2016-02-27 DIAGNOSIS — E611 Iron deficiency: Secondary | ICD-10-CM | POA: Diagnosis not present

## 2016-02-27 DIAGNOSIS — Z992 Dependence on renal dialysis: Secondary | ICD-10-CM | POA: Diagnosis not present

## 2016-02-27 DIAGNOSIS — N186 End stage renal disease: Secondary | ICD-10-CM | POA: Diagnosis not present

## 2016-02-29 ENCOUNTER — Ambulatory Visit
Admission: RE | Admit: 2016-02-29 | Discharge: 2016-02-29 | Disposition: A | Discharge status: Home / Self Care | Payer: Medicare Other | Source: Ambulatory Visit | Attending: Vascular Surgery | Admitting: Vascular Surgery

## 2016-02-29 ENCOUNTER — Encounter
Admission: RE | Disposition: A | Discharge status: Home / Self Care | Payer: Self-pay | Source: Ambulatory Visit | Attending: Vascular Surgery

## 2016-02-29 ENCOUNTER — Encounter: Payer: Self-pay | Admitting: *Deleted

## 2016-02-29 DIAGNOSIS — Z992 Dependence on renal dialysis: Secondary | ICD-10-CM | POA: Diagnosis not present

## 2016-02-29 DIAGNOSIS — I742 Embolism and thrombosis of arteries of the upper extremities: Secondary | ICD-10-CM | POA: Diagnosis not present

## 2016-02-29 DIAGNOSIS — I509 Heart failure, unspecified: Secondary | ICD-10-CM | POA: Insufficient documentation

## 2016-02-29 DIAGNOSIS — Z8249 Family history of ischemic heart disease and other diseases of the circulatory system: Secondary | ICD-10-CM | POA: Diagnosis not present

## 2016-02-29 DIAGNOSIS — Y832 Surgical operation with anastomosis, bypass or graft as the cause of abnormal reaction of the patient, or of later complication, without mention of misadventure at the time of the procedure: Secondary | ICD-10-CM | POA: Insufficient documentation

## 2016-02-29 DIAGNOSIS — I132 Hypertensive heart and chronic kidney disease with heart failure and with stage 5 chronic kidney disease, or end stage renal disease: Secondary | ICD-10-CM | POA: Diagnosis not present

## 2016-02-29 DIAGNOSIS — T82590A Other mechanical complication of surgically created arteriovenous fistula, initial encounter: Secondary | ICD-10-CM | POA: Insufficient documentation

## 2016-02-29 DIAGNOSIS — Z87891 Personal history of nicotine dependence: Secondary | ICD-10-CM | POA: Diagnosis not present

## 2016-02-29 DIAGNOSIS — I10 Essential (primary) hypertension: Secondary | ICD-10-CM | POA: Diagnosis not present

## 2016-02-29 DIAGNOSIS — Z9889 Other specified postprocedural states: Secondary | ICD-10-CM | POA: Diagnosis not present

## 2016-02-29 DIAGNOSIS — N186 End stage renal disease: Secondary | ICD-10-CM | POA: Insufficient documentation

## 2016-02-29 DIAGNOSIS — T82898A Other specified complication of vascular prosthetic devices, implants and grafts, initial encounter: Secondary | ICD-10-CM | POA: Diagnosis not present

## 2016-02-29 DIAGNOSIS — I728 Aneurysm of other specified arteries: Secondary | ICD-10-CM | POA: Insufficient documentation

## 2016-02-29 HISTORY — PX: PERIPHERAL VASCULAR CATHETERIZATION: SHX172C

## 2016-02-29 LAB — POTASSIUM (ARMC VASCULAR LAB ONLY): Potassium (ARMC vascular lab): 4.5 (ref 3.5–5.1)

## 2016-02-29 SURGERY — A/V SHUNTOGRAM/FISTULAGRAM
Anesthesia: Moderate Sedation | Site: Arm Upper

## 2016-02-29 MED ORDER — HEPARIN SODIUM (PORCINE) 1000 UNIT/ML IJ SOLN
INTRAMUSCULAR | Status: AC
  Filled 2016-02-29: qty 1

## 2016-02-29 MED ORDER — FENTANYL CITRATE (PF) 100 MCG/2ML IJ SOLN
INTRAMUSCULAR | Status: AC
  Filled 2016-02-29: qty 4

## 2016-02-29 MED ORDER — IOPAMIDOL (ISOVUE-300) INJECTION 61%
INTRAVENOUS | Status: DC | PRN
  Administered 2016-02-29: 40 mL via INTRAVENOUS

## 2016-02-29 MED ORDER — ONDANSETRON HCL 4 MG/2ML IJ SOLN: 4.0000 mg | Freq: Four times a day (QID) | INTRAMUSCULAR | Status: DC | PRN

## 2016-02-29 MED ORDER — HEPARIN SODIUM (PORCINE) 1000 UNIT/ML IJ SOLN
INTRAMUSCULAR | Status: DC | PRN
  Administered 2016-02-29: 4000 [IU] via INTRAVENOUS

## 2016-02-29 MED ORDER — METHYLPREDNISOLONE SODIUM SUCC 125 MG IJ SOLR: 125.0000 mg | INTRAMUSCULAR | Status: DC | PRN

## 2016-02-29 MED ORDER — FAMOTIDINE 20 MG PO TABS: 40.0000 mg | ORAL_TABLET | ORAL | Status: DC | PRN

## 2016-02-29 MED ORDER — DEXTROSE 5 % IV SOLN
1.5000 g | INTRAVENOUS | Status: AC
  Administered 2016-02-29: 1.5 g via INTRAVENOUS

## 2016-02-29 MED ORDER — HEPARIN (PORCINE) IN NACL 2-0.9 UNIT/ML-% IJ SOLN
INTRAMUSCULAR | Status: AC
  Filled 2016-02-29: qty 1000

## 2016-02-29 MED ORDER — HYDROMORPHONE HCL 1 MG/ML IJ SOLN: 1.0000 mg | Freq: Once | INTRAMUSCULAR | Status: DC

## 2016-02-29 MED ORDER — SODIUM CHLORIDE 0.9 % IV SOLN
INTRAVENOUS | Status: DC
  Administered 2016-02-29: 10:00:00 via INTRAVENOUS

## 2016-02-29 MED ORDER — MIDAZOLAM HCL 2 MG/2ML IJ SOLN
INTRAMUSCULAR | Status: DC | PRN
  Administered 2016-02-29 (×3): 1 mg via INTRAVENOUS
  Administered 2016-02-29: 2 mg via INTRAVENOUS

## 2016-02-29 MED ORDER — LIDOCAINE-EPINEPHRINE (PF) 1 %-1:200000 IJ SOLN
INTRAMUSCULAR | Status: AC
  Filled 2016-02-29: qty 30

## 2016-02-29 MED ORDER — FENTANYL CITRATE (PF) 100 MCG/2ML IJ SOLN
INTRAMUSCULAR | Status: DC | PRN
Start: 2016-02-29 — End: 2016-02-29
  Administered 2016-02-29: 25 ug via INTRAVENOUS
  Administered 2016-02-29: 50 ug via INTRAVENOUS
  Administered 2016-02-29: 25 ug via INTRAVENOUS
  Administered 2016-02-29: 50 ug via INTRAVENOUS
  Administered 2016-02-29: 25 ug via INTRAVENOUS

## 2016-02-29 MED ORDER — MIDAZOLAM HCL 5 MG/5ML IJ SOLN
INTRAMUSCULAR | Status: AC
  Filled 2016-02-29: qty 5

## 2016-02-29 SURGICAL SUPPLY — 13 items
BALLN ARMADA 3.0X100X150 (BALLOONS) ×4
BALLOON ARMADA 3.0X100X150 (BALLOONS) ×2 IMPLANT
BALLOON ARMADA 3.0X20X150 (BALLOONS) ×4 IMPLANT
CANNULA 5F STIFF (CANNULA) ×4 IMPLANT
CATH TORCON 5FR 0.38 (CATHETERS) ×4 IMPLANT
DEVICE PRESTO INFLATION (MISCELLANEOUS) ×4 IMPLANT
DEVICE TORQUE (MISCELLANEOUS) ×4 IMPLANT
DRAPE BRACHIAL (DRAPES) ×4 IMPLANT
GLIDEWIRE STIFF .35X180X3 HYDR (WIRE) ×4 IMPLANT
PACK ANGIOGRAPHY (CUSTOM PROCEDURE TRAY) ×4 IMPLANT
SHEATH BRITE TIP 6FRX5.5 (SHEATH) ×4 IMPLANT
TOWEL OR 17X26 4PK STRL BLUE (TOWEL DISPOSABLE) ×4 IMPLANT
WIRE G V18X300CM (WIRE) ×4 IMPLANT

## 2016-02-29 NOTE — Op Note (Signed)
[facility]    OPERATIVE NOTE   PROCEDURE: 1.   Left radiocephalic arteriovenous fistula cannulation under ultrasound guidance 2.   Left arm fistulagram including central venogram 3.   Catheter placement into proximal left radial artery retrograde through the fistula and left upper extremity angiogram. 4.   Percutaneous transluminal angioplasty of the left radial artery just proximal to the anastomosis and across the anastomosis with 3 mm diameter by 10 cm length angioplasty balloon  PRE-OPERATIVE DIAGNOSIS: 1. ESRD 2. Poorly functional left radiocephalic AVF  POST-OPERATIVE DIAGNOSIS: same as above   SURGEON: Leotis Pain, MD  ANESTHESIA: local with MCS  ESTIMATED BLOOD LOSS: minimal  FINDING(S): 1. The fistula had multiple branches and the arm with dual outflow in the upper arm but the deep venous system being the more dominant outflow. 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 2. The radial artery was small with the ulnar artery being the dominant flow to the hand. There was hyperplasia of the radial artery at just proximal to the anastomosis that created a stenotic area that was difficult to discern the degree of stenosis but the catheter was essentially occlusive in the filling of the fistula was retrograde through the palmar arch and ulnar artery with an injection in the proximal radial artery. This appeared to clearly be greater than 70% and I elected to treat this area.  SPECIMEN(S):  None  CONTRAST: 40 cc  FLUORO TIME: 2.9 minutes  MODERATE CONSCIOUS SEDATION TIME: Approximately 25 minutes with 5 mg of Versed and 175 mcg of Fentanyl   INDICATIONS: [name] [name] is a 50 y.o. male who presents with malfunctioning  left radiocephalic arteriovenous fistula.  The patient is scheduled for  left arm fistulagram for further evaluation. He has a previous  loop forearm AV graft that creates difficulty accessing the fistula and the fistula is also somewhat deep which may be making access somewhat difficult, but further evaluation is planned before any surgical revision.  The patient is aware the risks include but are not limited to: bleeding, infection, thrombosis of the cannulated access, and possible anaphylactic reaction to the contrast.  The patient is aware of the risks of the procedure and elects to proceed forward.  DESCRIPTION: After full informed written consent was obtained, the patient was brought back to the angiography suite and placed supine upon the angiography table.  The patient was connected to monitoring equipment. Moderate conscious sedation was administered with a face to face encounter with the patient throughout the procedure with my supervision of the RN administering medicines and monitoring the patient's vital signs and mental status throughout from the start of the procedure until the patient was taken to the recovery room. The  left arm was prepped and draped in the standard fashion for a percutaneous access intervention.  Under ultrasound guidance, the  proximal to mid forearm cephalic vein portion of the arteriovenous fistula was cannulated with a micropuncture needle under direct ultrasound guidance in a retrograde fashion and a permanent image was performed.  The microwire was advanced into the fistula and the needle was exchanged for the a microsheath.  I then upsized to a 6 Fr Sheath and imaging was performed.  Hand injections were completed to image the access including the central venous system. This demonstrated the fistula had multiple branches and the arm with dual outflow in the upper arm but the deep  venous system being the more dominant outflow. 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 Given these findings, I felt was  appropriate to address the inflow and decided to assess the arterial circuit in the left upper extremity. A Glidewire and Kumpe catheter were used to cross the anastomosis and advanced to the proximal radial artery just below the brachial bifurcation and perform imaging. The 40 cm Kumpe catheter was hubbed out at this location.The radial artery was small with the ulnar artery being the dominant flow to the hand. There was hyperplasia of the radial artery at just proximal to the anastomosis that created a stenotic area that was difficult to discern the degree of stenosis but the catheter was essentially occlusive in the filling of the fistula was retrograde through the palmar arch and ulnar artery with an injection in the proximal radial artery. This appeared to clearly be greater than 70% and I elected to treat this area. I then gave the patient 4000 units of intravenous heparin.  I then replaced a 0.018 wire into the brachial artery.  Based on the imaging, a 3 mm x 10 cm  angioplasty balloon was selected.  The balloon was centered around the radial artery in the forearm with about 2 cm of the balloon back into the fistula across the anastomosis and the remainder of the balloon proximal to the anastomosis in what appeared to be a diseased radial artery and inflated to 14 ATM for 1 minute(s).  The Kumpe catheter was then placed back into the radial artery in the mid to distal forearm and imaging was performed. On completion imaging, a less than 30 % residual stenosis was present and flow was now brisk into the fistula whereas before only retrograde filling was seen.     Based on the completion imaging, no further intervention is necessary.  The wire and balloon were removed from the sheath.  A 4-0 Monocryl purse-string suture was sewn around the sheath.  The sheath was removed while tying down the suture.  A sterile bandage was applied to the puncture site.  COMPLICATIONS: None  CONDITION: Stable   Leotis Pain  02/29/2016 12:23 PM   This note was created with Dragon Medical transcription system. Any errors in dictation are purely unintentional.

## 2016-02-29 NOTE — H&P (Signed)
[facility] Admission History & Physical  MRN : [id number]  [name] [name] is a 50 y.o. (1965/10/29) male who presents with chief complaint of No chief complaint on file. [name]  History of Present Illness: Patient presents with inability to use his left arm AV fistula. This was placed couple of months ago. He had multiple failed accesses in that arm which make a little more difficult. He was seen about a month ago, and at that time they had not yet tried uses fistula and I asked that they give it several attempts before we do a surgical revision or fistulogram. They have done that is still not working. He has no other complaints today. His PermCath is working okay.  No current facility-administered medications for this encounter.     Past Medical History:  Diagnosis Date  . Aneurysm ([place])     Right arm fistula 3 aneurysms 2011,   plans to have a new procedure  . Angina    occasional, last 6 mo ago  . Chest discomfort     2008  . CHF (congestive heart failure) ([place])   . Dialysis patient [facility])    M-W-F @ [place]  . Ejection fraction     65%, echo, 2008   /   EF 55-60%, echo, October 28, 2010  . ESRD (end stage renal disease) ([place])    on hemodialysis T_T_S  . Hypertension   . Leg pain   . Orthostatic hypotension     2008  . Overweight(278.02)   . Sinus tachycardia     2008, TSH normal  . Syncope     positional after dialysis... 2008    Past Surgical History:  Procedure Laterality Date  . ARTERIOVENOUS GRAFT PLACEMENT    . AV FISTULA PLACEMENT    . AV FISTULA PLACEMENT Left 11/18/2015   Procedure: ARTERIOVENOUS (AV) FISTULA CREATION ( RADIOCEPHALIC );  Surgeon: Algernon Huxley, MD;  Location: [facility];  Service: Vascular;  Laterality: Left;  . HERNIA REPAIR    . INSERTION OF DIALYSIS CATHETER  03/01/2011   Procedure: INSERTION OF DIALYSIS CATHETER;  Surgeon: Elam Dutch, MD;  Location: [place];  Service: Vascular;  Laterality: Left;  Exchange of Dialysis  Catheter to 27cm 15Fr. Arrow Catheter  . PERIPHERAL VASCULAR CATHETERIZATION N/A 09/01/2014   Procedure: A/V Shuntogram/Fistulagram;  Surgeon: Algernon Huxley, MD;  Location: [place] [facility];  Service: Cardiovascular;  Laterality: N/A;  . PERIPHERAL VASCULAR CATHETERIZATION Right 09/01/2014   Procedure: Thrombectomy;  Surgeon: Algernon Huxley, MD;  Location: [place] [facility];  Service: Cardiovascular;  Laterality: Right;  . PERIPHERAL VASCULAR CATHETERIZATION Right 09/01/2014   Procedure: A/V Shunt Intervention;  Surgeon: Algernon Huxley, MD;  Location: [place] [facility];  Service: Cardiovascular;  Laterality: Right;  . PERIPHERAL VASCULAR CATHETERIZATION N/A 09/17/2014   Procedure: A/V Shuntogram/Fistulagram;  Surgeon: Algernon Huxley, MD;  Location: [place] [facility];  Service: Cardiovascular;  Laterality: N/A;  . PERIPHERAL VASCULAR CATHETERIZATION Right 10/17/2014   Procedure: Thrombectomy;  Surgeon: Katha Cabal, MD;  Location: [place] [facility];  Service: Cardiovascular;  Laterality: Right;  . PERIPHERAL VASCULAR CATHETERIZATION N/A 10/17/2014   Procedure: A/V Shuntogram/Fistulagram;  Surgeon: Katha Cabal, MD;  Location: [place] [facility];  Service: Cardiovascular;  Laterality: N/A;  . PERIPHERAL VASCULAR CATHETERIZATION N/A 10/17/2014   Procedure: A/V Shunt Intervention;  Surgeon: Katha Cabal, MD;  Location: [place] [facility];  Service: Cardiovascular;  Laterality: N/A;  .  PERIPHERAL VASCULAR CATHETERIZATION Right 11/04/2014   Procedure: Thrombectomy;  Surgeon: Katha Cabal, MD;  Location: [place] [facility];  Service: Cardiovascular;  Laterality: Right;  . PERIPHERAL VASCULAR CATHETERIZATION Left 11/04/2014   Procedure: Visceral Venography;  Surgeon: Katha Cabal, MD;  Location: [place] [facility];  Service: Cardiovascular;  Laterality: Left;  . PERIPHERAL VASCULAR CATHETERIZATION Right 11/27/2014   Procedure: A/V Shuntogram/Fistulagram;  Surgeon: Algernon Huxley, MD;   Location: [place] [facility];  Service: Cardiovascular;  Laterality: Right;  . PERIPHERAL VASCULAR CATHETERIZATION Right 11/27/2014   Procedure: A/V Shunt Intervention;  Surgeon: Algernon Huxley, MD;  Location: [place] [facility];  Service: Cardiovascular;  Laterality: Right;  . PERIPHERAL VASCULAR CATHETERIZATION Right 12/31/2014   Procedure: Thrombectomy;  Surgeon: Algernon Huxley, MD;  Location: [place] [facility];  Service: Cardiovascular;  Laterality: Right;  . PERIPHERAL VASCULAR CATHETERIZATION Right 01/12/2015   Procedure: A/V Shuntogram/Fistulagram;  Surgeon: Algernon Huxley, MD;  Location: [place] [facility];  Service: Cardiovascular;  Laterality: Right;  . PERIPHERAL VASCULAR CATHETERIZATION N/A 01/12/2015   Procedure: A/V Shunt Intervention;  Surgeon: Algernon Huxley, MD;  Location: [place] [facility];  Service: Cardiovascular;  Laterality: N/A;  . PERIPHERAL VASCULAR CATHETERIZATION Right 01/28/2015   Procedure: Thrombectomy;  Surgeon: Algernon Huxley, MD;  Location: [place] [facility];  Service: Cardiovascular;  Laterality: Right;  . PERIPHERAL VASCULAR CATHETERIZATION N/A 06/08/2015   Procedure: A/V Shuntogram/Fistulagram;  Surgeon: Algernon Huxley, MD;  Location: [place] [facility];  Service: Cardiovascular;  Laterality: N/A;  . PERIPHERAL VASCULAR CATHETERIZATION N/A 06/08/2015   Procedure: A/V Shunt Intervention;  Surgeon: Algernon Huxley, MD;  Location: [place] [facility];  Service: Cardiovascular;  Laterality: N/A;  . PERIPHERAL VASCULAR CATHETERIZATION Right 07/06/2015   Procedure: A/V Shuntogram/Fistulagram;  Surgeon: Algernon Huxley, MD;  Location: [place] [facility];  Service: Cardiovascular;  Laterality: Right;  . PERIPHERAL VASCULAR CATHETERIZATION N/A 07/06/2015   Procedure: A/V Shunt Intervention;  Surgeon: Algernon Huxley, MD;  Location: [place] [facility];  Service: Cardiovascular;  Laterality: N/A;  . PERIPHERAL VASCULAR CATHETERIZATION N/A 08/04/2015   Procedure: graft declot;  Surgeon:  Katha Cabal, MD;  Location: [place] [facility];  Service: Cardiovascular;  Laterality: N/A;  . PERIPHERAL VASCULAR CATHETERIZATION Right 08/25/2015   Procedure: A/V Shuntogram/Fistulagram;  Surgeon: Katha Cabal, MD;  Location: [place] [facility];  Service: Cardiovascular;  Laterality: Right;  . PERIPHERAL VASCULAR CATHETERIZATION N/A 11/04/2015   Procedure: Dialysis/Perma Catheter Insertion;  Surgeon: Algernon Huxley, MD;  Location: [place] [facility];  Service: Cardiovascular;  Laterality: N/A;  . PERIPHERAL VASCULAR CATHETERIZATION Left 12/14/2015   Procedure: A/V Shuntogram/Fistulagram;  Surgeon: Algernon Huxley, MD;  Location: [place] [facility];  Service: Cardiovascular;  Laterality: Left;  . PERIPHERAL VASCULAR CATHETERIZATION N/A 12/14/2015   Procedure: A/V Shunt Intervention;  Surgeon: Algernon Huxley, MD;  Location: [place] [facility];  Service: Cardiovascular;  Laterality: N/A;  . PERIPHERAL VASCULAR CATHETERIZATION N/A 12/17/2015   Procedure: Dialysis/Perma Catheter Insertion;  Surgeon: Algernon Huxley, MD;  Location: [place] [facility];  Service: Cardiovascular;  Laterality: N/A;  . PERIPHERAL VASCULAR CATHETERIZATION Left 12/22/2015   Procedure: Dialysis/Perma Catheter Insertion;  Surgeon: Katha Cabal, MD;  Location: [place] [facility];  Service: Cardiovascular;  Laterality: Left;  . right arm graft     for dyalisis  . THROMBECTOMY    . THROMBECTOMY W/ EMBOLECTOMY  03/01/2011  Procedure: THROMBECTOMY ARTERIOVENOUS GORE-TEX GRAFT;  Surgeon: Elam Dutch, MD;  Location: [place];  Service: Vascular;  Laterality: Right;  Attempted Thrombectomy of Old  Right Upper Arm Arteriovenous gortex Graft. Insertion of new Arteriovenous Graft using 71mm x 50cm Gortex Stretch graft.   . THROMBECTOMY W/ EMBOLECTOMY  07/11/2011   Procedure: THROMBECTOMY ARTERIOVENOUS GORE-TEX GRAFT;  Surgeon: Rosetta Posner, MD;  Location: [name];  Service: Vascular;  Laterality: Right;  . UMBILICAL HERNIA REPAIR     . VENOGRAM N/A 08/23/2011   Procedure: VENOGRAM;  Surgeon: [name], MD;  Location: [facility];  Service: Cardiovascular;  Laterality: N/A;    Social History Social History  Substance Use Topics  . Smoking status: Former Smoker    Types: Cigarettes    Quit date: 04/18/1994  . Smokeless tobacco: Current User    Types: Snuff     Comment: dips 1/2 snuff per day times 25 years  . Alcohol use No    Family History Family History  Problem Relation Age of Onset  . Hypertension Mother   . Heart disease Mother   . Hypertension Father   . Heart disease Father     No Known Allergies   REVIEW OF SYSTEMS (Negative unless checked)  Constitutional: [] Weight loss  [] Fever  [] Chills Cardiac: [] Chest pain   [] Chest pressure   [] Palpitations   [] Shortness of breath when laying flat   [] Shortness of breath at rest   [] Shortness of breath with exertion. Vascular:  [] Pain in legs with walking   [] Pain in legs at rest   [] Pain in legs when laying flat   [] Claudication   [] Pain in feet when walking  [] Pain in feet at rest  [] Pain in feet when laying flat   [] History of DVT   [] Phlebitis   [] Swelling in legs   [] Varicose veins   [] Non-healing ulcers Pulmonary:   [] Uses home oxygen   [] Productive cough   [] Hemoptysis   [] Wheeze  [] COPD   [] Asthma Neurologic:  [] Dizziness  [] Blackouts   [] Seizures   [] History of stroke   [] History of TIA  [] Aphasia   [] Temporary blindness   [] Dysphagia   [] Weakness or numbness in arms   [] Weakness or numbness in legs Musculoskeletal:  [] Arthritis   [] Joint swelling   [] Joint pain   [] Low back pain Hematologic:  [] Easy bruising  [] Easy bleeding   [] Hypercoagulable state   [] Anemic  [] Hepatitis Gastrointestinal:  [] Blood in stool   [] Vomiting blood  [] Gastroesophageal reflux/heartburn   [] Difficulty swallowing. Genitourinary:  [x] Chronic kidney disease   [] Difficult urination  [] Frequent urination  [] Burning with urination   [] Blood in urine Skin:  [] Rashes    [] Ulcers   [] Wounds Psychological:  [] History of anxiety   []  History of major depression.  Physical Examination  There were no vitals filed for this visit. There is no height or weight on file to calculate BMI. Gen: WD/WN, NAD Head: [place]/AT, No temporalis wasting. Prominent temp pulse not noted. Ear/Nose/Throat: Hearing grossly intact, nares w/o erythema or drainage, oropharynx w/o Erythema/Exudate,  Eyes: Conjunctiva clear, sclera non-icteric Neck: Trachea midline.  No JVD.  Pulmonary:  Good air movement, respirations not labored, no use of accessory muscles.  Cardiac: RRR, normal S1, S2. Vascular: Thrill and bruit present and left arm AV fistula Vessel Right Left  Radial Palpable Palpable  Gastrointestinal: soft, non-tender/non-distended. No guarding/reflex.  Musculoskeletal: M/S 5/5 throughout.  Extremities without ischemic changes.  No deformity or atrophy.  Neurologic: Sensation grossly intact in extremities.  Symmetrical.  Speech is fluent. Motor exam as listed above. Psychiatric: Judgment intact, Mood & affect appropriate for pt's clinical situation. Dermatologic: No rashes or ulcers noted.  No cellulitis or open wounds. Lymph : No Cervical, Axillary, or Inguinal lymphadenopathy.     CBC Lab Results  Component Value Date   WBC 9.4 11/13/2015   HGB 16.3 11/18/2015   HCT 48.0 11/18/2015   MCV 76.0 (L) 11/13/2015   PLT 308 11/13/2015    BMET    Component Value Date/Time   NA 138 11/18/2015 1210   NA 137 11/13/2013 0408   K 7.1 (HH) 12/22/2015 1200   K 4.1 11/13/2013 0408   CL 101 11/13/2015 0949   CL 98 11/13/2013 0408   CO2 25 11/13/2015 0949   CO2 25 11/13/2013 0408   GLUCOSE 83 11/18/2015 1210   GLUCOSE 64 (L) 11/13/2013 0408   BUN 31 (H) 11/13/2015 0949   BUN 68 (H) 11/13/2013 0408   CREATININE 10.59 (H) 11/13/2015 0949   CREATININE 16.45 (H) 11/13/2013 0408   CALCIUM 8.6 (L) 11/13/2015 0949   CALCIUM 6.8 (LL)  11/13/2013 0408   GFRNONAA 5 (L) 11/13/2015 0949   GFRNONAA 3 (L) 11/13/2013 0408   GFRAA 6 (L) 11/13/2015 0949   GFRAA 3 (L) 11/13/2013 0408   CrCl cannot be calculated (Patient's most recent lab result is older than the maximum 21 days allowed.).  COAG Lab Results  Component Value Date   INR 1.25 11/13/2015   INR 1.05 08/08/2012   INR 0.98 06/01/2011    Radiology No results found.  Assessment/Plan 1. Dysfunction of dialysis access. Still unable to use his fistula after several attempts. Fistulogram today and may likely need surgical revision. 2. End-stage renal disease. Access issues as above. Currently using PermCath and working well.    Leotis Pain, MD  02/29/2016 8:48 AM

## 2016-02-29 NOTE — Progress Notes (Signed)
Pt procedure done, tolerated well, vitals stable throughout entire procedure, will transfer pt to recovery for further monitoring.prior to discharge

## 2016-02-29 NOTE — Progress Notes (Signed)
Patient here today for left arm fistulogram, alert and oriented, vitals stable,, will cont to monitor vitals as well as etco2 during and throughout entire procedure,

## 2016-03-01 ENCOUNTER — Encounter: Payer: Self-pay | Admitting: Vascular Surgery

## 2016-03-01 DIAGNOSIS — E611 Iron deficiency: Secondary | ICD-10-CM | POA: Diagnosis not present

## 2016-03-01 DIAGNOSIS — N2581 Secondary hyperparathyroidism of renal origin: Secondary | ICD-10-CM | POA: Diagnosis not present

## 2016-03-01 DIAGNOSIS — Z992 Dependence on renal dialysis: Secondary | ICD-10-CM | POA: Diagnosis not present

## 2016-03-01 DIAGNOSIS — N186 End stage renal disease: Secondary | ICD-10-CM | POA: Diagnosis not present

## 2016-03-03 ENCOUNTER — Encounter (INDEPENDENT_AMBULATORY_CARE_PROVIDER_SITE_OTHER): Payer: Self-pay

## 2016-03-03 DIAGNOSIS — N186 End stage renal disease: Secondary | ICD-10-CM | POA: Diagnosis not present

## 2016-03-03 DIAGNOSIS — N2581 Secondary hyperparathyroidism of renal origin: Secondary | ICD-10-CM | POA: Diagnosis not present

## 2016-03-03 DIAGNOSIS — E611 Iron deficiency: Secondary | ICD-10-CM | POA: Diagnosis not present

## 2016-03-03 DIAGNOSIS — Z992 Dependence on renal dialysis: Secondary | ICD-10-CM | POA: Diagnosis not present

## 2016-03-05 DIAGNOSIS — Z992 Dependence on renal dialysis: Secondary | ICD-10-CM | POA: Diagnosis not present

## 2016-03-05 DIAGNOSIS — N2581 Secondary hyperparathyroidism of renal origin: Secondary | ICD-10-CM | POA: Diagnosis not present

## 2016-03-05 DIAGNOSIS — E611 Iron deficiency: Secondary | ICD-10-CM | POA: Diagnosis not present

## 2016-03-05 DIAGNOSIS — N186 End stage renal disease: Secondary | ICD-10-CM | POA: Diagnosis not present

## 2016-03-08 DIAGNOSIS — N186 End stage renal disease: Secondary | ICD-10-CM | POA: Diagnosis not present

## 2016-03-08 DIAGNOSIS — N2581 Secondary hyperparathyroidism of renal origin: Secondary | ICD-10-CM | POA: Diagnosis not present

## 2016-03-08 DIAGNOSIS — Z992 Dependence on renal dialysis: Secondary | ICD-10-CM | POA: Diagnosis not present

## 2016-03-08 DIAGNOSIS — E611 Iron deficiency: Secondary | ICD-10-CM | POA: Diagnosis not present

## 2016-03-09 ENCOUNTER — Other Ambulatory Visit (INDEPENDENT_AMBULATORY_CARE_PROVIDER_SITE_OTHER): Payer: Self-pay | Admitting: Vascular Surgery

## 2016-03-10 DIAGNOSIS — N186 End stage renal disease: Secondary | ICD-10-CM | POA: Diagnosis not present

## 2016-03-10 DIAGNOSIS — N2581 Secondary hyperparathyroidism of renal origin: Secondary | ICD-10-CM | POA: Diagnosis not present

## 2016-03-10 DIAGNOSIS — E611 Iron deficiency: Secondary | ICD-10-CM | POA: Diagnosis not present

## 2016-03-10 DIAGNOSIS — Z992 Dependence on renal dialysis: Secondary | ICD-10-CM | POA: Diagnosis not present

## 2016-03-12 DIAGNOSIS — N186 End stage renal disease: Secondary | ICD-10-CM | POA: Diagnosis not present

## 2016-03-12 DIAGNOSIS — N2581 Secondary hyperparathyroidism of renal origin: Secondary | ICD-10-CM | POA: Diagnosis not present

## 2016-03-12 DIAGNOSIS — Z992 Dependence on renal dialysis: Secondary | ICD-10-CM | POA: Diagnosis not present

## 2016-03-12 DIAGNOSIS — E611 Iron deficiency: Secondary | ICD-10-CM | POA: Diagnosis not present

## 2016-03-15 DIAGNOSIS — N186 End stage renal disease: Secondary | ICD-10-CM | POA: Diagnosis not present

## 2016-03-15 DIAGNOSIS — E611 Iron deficiency: Secondary | ICD-10-CM | POA: Diagnosis not present

## 2016-03-15 DIAGNOSIS — N2581 Secondary hyperparathyroidism of renal origin: Secondary | ICD-10-CM | POA: Diagnosis not present

## 2016-03-15 DIAGNOSIS — Z992 Dependence on renal dialysis: Secondary | ICD-10-CM | POA: Diagnosis not present

## 2016-03-16 ENCOUNTER — Encounter
Admission: RE | Admit: 2016-03-16 | Discharge: 2016-03-16 | Disposition: A | Discharge status: Home / Self Care | Payer: Medicare Other | Source: Ambulatory Visit | Attending: Vascular Surgery | Admitting: Vascular Surgery

## 2016-03-16 DIAGNOSIS — N186 End stage renal disease: Secondary | ICD-10-CM | POA: Insufficient documentation

## 2016-03-16 DIAGNOSIS — Y838 Other surgical procedures as the cause of abnormal reaction of the patient, or of later complication, without mention of misadventure at the time of the procedure: Secondary | ICD-10-CM | POA: Diagnosis not present

## 2016-03-16 DIAGNOSIS — Z01818 Encounter for other preprocedural examination: Secondary | ICD-10-CM | POA: Diagnosis not present

## 2016-03-16 DIAGNOSIS — T82590A Other mechanical complication of surgically created arteriovenous fistula, initial encounter: Secondary | ICD-10-CM | POA: Diagnosis not present

## 2016-03-16 DIAGNOSIS — Z87891 Personal history of nicotine dependence: Secondary | ICD-10-CM | POA: Insufficient documentation

## 2016-03-16 LAB — CBC WITH DIFFERENTIAL/PLATELET
BASOS ABS: 0.1 10*3/uL (ref 0–0.1)
BASOS PCT: 1 %
EOS ABS: 0.5 10*3/uL (ref 0–0.7)
EOS PCT: 6 %
HCT: 43.8 % (ref 40.0–52.0)
Hemoglobin: 13.9 g/dL (ref 13.0–18.0)
Lymphocytes Relative: 12 %
Lymphs Abs: 1 10*3/uL (ref 1.0–3.6)
MCH: 24.5 pg — ABNORMAL LOW (ref 26.0–34.0)
MCHC: 31.8 g/dL — ABNORMAL LOW (ref 32.0–36.0)
MCV: 77.1 fL — AB (ref 80.0–100.0)
MONO ABS: 0.6 10*3/uL (ref 0.2–1.0)
Monocytes Relative: 7 %
Neutro Abs: 6.6 10*3/uL — ABNORMAL HIGH (ref 1.4–6.5)
Neutrophils Relative %: 74 %
PLATELETS: 286 10*3/uL (ref 150–440)
RBC: 5.68 MIL/uL (ref 4.40–5.90)
RDW: 18.1 % — AB (ref 11.5–14.5)
WBC: 8.7 10*3/uL (ref 3.8–10.6)

## 2016-03-16 LAB — BASIC METABOLIC PANEL
Anion gap: 13 (ref 5–15)
BUN: 34 mg/dL — AB (ref 6–20)
CALCIUM: 8.6 mg/dL — AB (ref 8.9–10.3)
CO2: 28 mmol/L (ref 22–32)
Chloride: 99 mmol/L — ABNORMAL LOW (ref 101–111)
Creatinine, Ser: 11.38 mg/dL — ABNORMAL HIGH (ref 0.61–1.24)
GFR calc Af Amer: 5 mL/min — ABNORMAL LOW (ref >60)
GFR, EST NON AFRICAN AMERICAN: 5 mL/min — AB (ref >60)
GLUCOSE: 85 mg/dL (ref 65–99)
Potassium: 4.4 mmol/L (ref 3.5–5.1)
SODIUM: 140 mmol/L (ref 135–145)

## 2016-03-16 LAB — TYPE AND SCREEN
ABO/RH(D): B POS
Antibody Screen: NEGATIVE

## 2016-03-16 LAB — PROTIME-INR
INR: 1.26
PROTHROMBIN TIME: 15.9 s — AB (ref 11.4–15.2)

## 2016-03-16 LAB — APTT: APTT: 34 s (ref 24–36)

## 2016-03-16 LAB — SURGICAL PCR SCREEN
MRSA, PCR: NEGATIVE
Staphylococcus aureus: NEGATIVE

## 2016-03-16 NOTE — Patient Instructions (Addendum)
  Your procedure is scheduled on: Wednesday, March 23, 2016 Report to Same Day Surgery 2nd floor medical mall [facility] Entrance-take elevator on left to 2nd floor.  Check in with surgery information desk.) To find out your arrival time please call [phone] between 1PM - 3PM on Tuesday, December 5th  Remember: Instructions that are not followed completely may result in serious medical risk, up to and including death, or upon the discretion of your surgeon and anesthesiologist your surgery may need to be rescheduled.    _x___ 1. Do not eat food or drink liquids after midnight. No gum chewing or hard candies.     __x__ 2. No Alcohol for 24 hours before or after surgery.   __x__3. No Smoking for 24 prior to surgery.   ____  4. Bring all medications with you on the day of surgery if instructed.    __x__ 5. Notify your doctor if there is any change in your medical condition     (cold, fever, infections).     Do not wear jewelry, make-up, hairpins, clips or nail polish.  Do not wear lotions, powders, or perfumes. You may wear deodorant.  Do not shave 48 hours prior to surgery. Men may shave face and neck.  Do not bring valuables to the hospital.    [facility] is not responsible for any belongings or valuables.               Contacts, dentures or bridgework may not be worn into surgery.  Leave your suitcase in the car. After surgery it may be brought to your room.  For patients admitted to the hospital, discharge time is determined by your treatment team.   Patients discharged the day of surgery will not be allowed to drive home.  You will need someone to drive you home and stay with you the night of your procedure.    Please read over the following fact sheets that you were given:   [facility] Preparing for Surgery and or MRSA Information   _x___ Take these medicines the morning of surgery with A SIP OF WATER:    1. NONE  2.  3.  4.  5.  6.  ____Fleets enema or  Magnesium Citrate as directed.   _x___ Use CHG Soap or sage wipes as directed on instruction sheet   ____ Use inhalers on the day of surgery and bring to hospital day of surgery  ____ Stop metformin 2 days prior to surgery    ____ Take 1/2 of usual insulin dose the night before surgery and none on the morning of           surgery.   _X___ Last dose of Eliquis on Saturday, March 19, 2016  x__ Stop Anti-inflammatories such as Advil, Aleve, Ibuprofen, Motrin, Naproxen,          Naprosyn, Goodies powders or aspirin products. Ok to take Tylenol.   ____ Stop supplements until after surgery.    ____ Bring C-Pap to the hospital.

## 2016-03-16 NOTE — Pre-Procedure Instructions (Signed)
Call to Dr. Bunnie Domino office and spoke to [place] regarding when the patient is to stop taking aspirin and eliquis. May continue the aspirin but stop the eliquis 3 days prior to surgery.

## 2016-03-17 DIAGNOSIS — N2581 Secondary hyperparathyroidism of renal origin: Secondary | ICD-10-CM | POA: Diagnosis not present

## 2016-03-17 DIAGNOSIS — E611 Iron deficiency: Secondary | ICD-10-CM | POA: Diagnosis not present

## 2016-03-17 DIAGNOSIS — Z992 Dependence on renal dialysis: Secondary | ICD-10-CM | POA: Diagnosis not present

## 2016-03-17 DIAGNOSIS — N186 End stage renal disease: Secondary | ICD-10-CM | POA: Diagnosis not present

## 2016-03-19 DIAGNOSIS — E611 Iron deficiency: Secondary | ICD-10-CM | POA: Diagnosis not present

## 2016-03-19 DIAGNOSIS — N186 End stage renal disease: Secondary | ICD-10-CM | POA: Diagnosis not present

## 2016-03-19 DIAGNOSIS — N2581 Secondary hyperparathyroidism of renal origin: Secondary | ICD-10-CM | POA: Diagnosis not present

## 2016-03-19 DIAGNOSIS — Z992 Dependence on renal dialysis: Secondary | ICD-10-CM | POA: Diagnosis not present

## 2016-03-22 DIAGNOSIS — Z992 Dependence on renal dialysis: Secondary | ICD-10-CM | POA: Diagnosis not present

## 2016-03-22 DIAGNOSIS — E611 Iron deficiency: Secondary | ICD-10-CM | POA: Diagnosis not present

## 2016-03-22 DIAGNOSIS — N2581 Secondary hyperparathyroidism of renal origin: Secondary | ICD-10-CM | POA: Diagnosis not present

## 2016-03-22 DIAGNOSIS — N186 End stage renal disease: Secondary | ICD-10-CM | POA: Diagnosis not present

## 2016-03-23 ENCOUNTER — Encounter: Payer: Self-pay | Admitting: *Deleted

## 2016-03-23 ENCOUNTER — Ambulatory Visit: Payer: Medicare Other | Admitting: Certified Registered"

## 2016-03-23 ENCOUNTER — Ambulatory Visit
Admission: RE | Admit: 2016-03-23 | Discharge: 2016-03-23 | Disposition: A | Discharge status: Home / Self Care | Payer: Medicare Other | Source: Ambulatory Visit | Attending: Vascular Surgery | Admitting: Vascular Surgery

## 2016-03-23 ENCOUNTER — Encounter
Admission: RE | Disposition: A | Discharge status: Home / Self Care | Payer: Self-pay | Source: Ambulatory Visit | Attending: Vascular Surgery

## 2016-03-23 DIAGNOSIS — Y832 Surgical operation with anastomosis, bypass or graft as the cause of abnormal reaction of the patient, or of later complication, without mention of misadventure at the time of the procedure: Secondary | ICD-10-CM | POA: Diagnosis not present

## 2016-03-23 DIAGNOSIS — Z79899 Other long term (current) drug therapy: Secondary | ICD-10-CM | POA: Insufficient documentation

## 2016-03-23 DIAGNOSIS — Z992 Dependence on renal dialysis: Secondary | ICD-10-CM | POA: Insufficient documentation

## 2016-03-23 DIAGNOSIS — Z87891 Personal history of nicotine dependence: Secondary | ICD-10-CM | POA: Insufficient documentation

## 2016-03-23 DIAGNOSIS — Z7901 Long term (current) use of anticoagulants: Secondary | ICD-10-CM | POA: Diagnosis not present

## 2016-03-23 DIAGNOSIS — I509 Heart failure, unspecified: Secondary | ICD-10-CM | POA: Insufficient documentation

## 2016-03-23 DIAGNOSIS — Z7982 Long term (current) use of aspirin: Secondary | ICD-10-CM | POA: Insufficient documentation

## 2016-03-23 DIAGNOSIS — N186 End stage renal disease: Secondary | ICD-10-CM | POA: Insufficient documentation

## 2016-03-23 DIAGNOSIS — I132 Hypertensive heart and chronic kidney disease with heart failure and with stage 5 chronic kidney disease, or end stage renal disease: Secondary | ICD-10-CM | POA: Insufficient documentation

## 2016-03-23 DIAGNOSIS — T82591A Other mechanical complication of surgically created arteriovenous shunt, initial encounter: Secondary | ICD-10-CM | POA: Insufficient documentation

## 2016-03-23 DIAGNOSIS — E669 Obesity, unspecified: Secondary | ICD-10-CM | POA: Diagnosis not present

## 2016-03-23 DIAGNOSIS — Z6841 Body Mass Index (BMI) 40.0 and over, adult: Secondary | ICD-10-CM | POA: Diagnosis not present

## 2016-03-23 DIAGNOSIS — I10 Essential (primary) hypertension: Secondary | ICD-10-CM | POA: Diagnosis not present

## 2016-03-23 DIAGNOSIS — T82598A Other mechanical complication of other cardiac and vascular devices and implants, initial encounter: Secondary | ICD-10-CM | POA: Diagnosis not present

## 2016-03-23 HISTORY — PX: AV FISTULA PLACEMENT: SHX1204

## 2016-03-23 HISTORY — DX: Cardiac arrhythmia, unspecified: I49.9

## 2016-03-23 HISTORY — PX: AVGG REMOVAL: SHX5153

## 2016-03-23 LAB — POCT I-STAT 4, (NA,K, GLUC, HGB,HCT)
GLUCOSE: 80 mg/dL (ref 65–99)
HEMATOCRIT: 48 % (ref 39.0–52.0)
Hemoglobin: 16.3 g/dL (ref 13.0–17.0)
Potassium: 4 mmol/L (ref 3.5–5.1)
Sodium: 139 mmol/L (ref 135–145)

## 2016-03-23 SURGERY — ARTERIOVENOUS (AV) FISTULA CREATION
Anesthesia: General | Site: Arm Lower | Laterality: Left | Wound class: Clean

## 2016-03-23 MED ORDER — ONDANSETRON HCL 4 MG/2ML IJ SOLN: 4.0000 mg | Freq: Once | INTRAMUSCULAR | Status: DC | PRN

## 2016-03-23 MED ORDER — FENTANYL CITRATE (PF) 100 MCG/2ML IJ SOLN
25.0000 ug | INTRAMUSCULAR | Status: DC | PRN
  Administered 2016-03-23 (×3): 25 ug via INTRAVENOUS

## 2016-03-23 MED ORDER — SODIUM CHLORIDE 0.9 % IV SOLN
INTRAVENOUS | Status: DC | PRN
  Administered 2016-03-23: 5 mL via INTRAMUSCULAR

## 2016-03-23 MED ORDER — PAPAVERINE HCL 30 MG/ML IJ SOLN
INTRAMUSCULAR | Status: AC
  Filled 2016-03-23: qty 2

## 2016-03-23 MED ORDER — ROCURONIUM BROMIDE 100 MG/10ML IV SOLN
INTRAVENOUS | Status: DC | PRN
  Administered 2016-03-23: 20 mg via INTRAVENOUS
  Administered 2016-03-23: 50 mg via INTRAVENOUS

## 2016-03-23 MED ORDER — FENTANYL CITRATE (PF) 100 MCG/2ML IJ SOLN
INTRAMUSCULAR | Status: AC
  Filled 2016-03-23: qty 2

## 2016-03-23 MED ORDER — HEPARIN SODIUM (PORCINE) 1000 UNIT/ML IJ SOLN
INTRAMUSCULAR | Status: DC | PRN
  Administered 2016-03-23: 3000 [IU] via INTRAVENOUS

## 2016-03-23 MED ORDER — CEFAZOLIN SODIUM-DEXTROSE 2-4 GM/100ML-% IV SOLN
INTRAVENOUS | Status: AC
  Administered 2016-03-23: 2 g via INTRAVENOUS
  Filled 2016-03-23: qty 100

## 2016-03-23 MED ORDER — PHENYLEPHRINE HCL 10 MG/ML IJ SOLN
INTRAMUSCULAR | Status: DC | PRN
  Administered 2016-03-23 (×5): 200 ug via INTRAVENOUS
  Administered 2016-03-23: 100 ug via INTRAVENOUS
  Administered 2016-03-23 (×2): 200 ug via INTRAVENOUS
  Administered 2016-03-23: 300 ug via INTRAVENOUS

## 2016-03-23 MED ORDER — GLYCOPYRROLATE 0.2 MG/ML IJ SOLN
INTRAMUSCULAR | Status: DC | PRN
  Administered 2016-03-23: 1 mg via INTRAVENOUS

## 2016-03-23 MED ORDER — NEOSTIGMINE METHYLSULFATE 10 MG/10ML IV SOLN
INTRAVENOUS | Status: DC | PRN
  Administered 2016-03-23: 5 mg via INTRAVENOUS

## 2016-03-23 MED ORDER — HYDROCODONE-ACETAMINOPHEN 5-325 MG PO TABS
ORAL_TABLET | ORAL | Status: AC
  Filled 2016-03-23: qty 1

## 2016-03-23 MED ORDER — ONDANSETRON HCL 4 MG/2ML IJ SOLN
INTRAMUSCULAR | Status: DC | PRN
  Administered 2016-03-23: 4 mg via INTRAVENOUS

## 2016-03-23 MED ORDER — SODIUM CHLORIDE 0.9 % IV SOLN
INTRAVENOUS | Status: DC | PRN
  Administered 2016-03-23: 20 ug/min via INTRAVENOUS

## 2016-03-23 MED ORDER — HYDROCODONE-ACETAMINOPHEN 5-325 MG PO TABS: 1.0000 | ORAL_TABLET | Freq: Four times a day (QID) | ORAL | 0 refills | Status: DC | PRN

## 2016-03-23 MED ORDER — FAMOTIDINE 20 MG PO TABS
20.0000 mg | ORAL_TABLET | Freq: Once | ORAL | Status: AC
  Administered 2016-03-23: 20 mg via ORAL

## 2016-03-23 MED ORDER — FAMOTIDINE 20 MG PO TABS
ORAL_TABLET | ORAL | Status: AC
  Administered 2016-03-23: 20 mg via ORAL
  Filled 2016-03-23: qty 1

## 2016-03-23 MED ORDER — HYDROCODONE-ACETAMINOPHEN 5-325 MG PO TABS
1.0000 | ORAL_TABLET | Freq: Four times a day (QID) | ORAL | Status: DC | PRN
  Administered 2016-03-23: 1 via ORAL

## 2016-03-23 MED ORDER — SODIUM CHLORIDE 0.9 % IJ SOLN
INTRAMUSCULAR | Status: AC
  Filled 2016-03-23: qty 20

## 2016-03-23 MED ORDER — PAPAVERINE HCL 30 MG/ML IJ SOLN
INTRAMUSCULAR | Status: DC | PRN
  Administered 2016-03-23: 60 mg via INTRAVENOUS

## 2016-03-23 MED ORDER — HEPARIN SODIUM (PORCINE) 5000 UNIT/ML IJ SOLN
INTRAMUSCULAR | Status: AC
  Filled 2016-03-23: qty 2

## 2016-03-23 MED ORDER — LIDOCAINE HCL (CARDIAC) 20 MG/ML IV SOLN
INTRAVENOUS | Status: DC | PRN
  Administered 2016-03-23: 100 mg via INTRAVENOUS

## 2016-03-23 MED ORDER — CEFAZOLIN SODIUM-DEXTROSE 2-4 GM/100ML-% IV SOLN
2.0000 g | INTRAVENOUS | Status: AC
  Administered 2016-03-23: 2 g via INTRAVENOUS

## 2016-03-23 MED ORDER — SODIUM CHLORIDE 0.9 % IJ SOLN
INTRAMUSCULAR | Status: AC
  Filled 2016-03-23: qty 100

## 2016-03-23 MED ORDER — CHLORHEXIDINE GLUCONATE CLOTH 2 % EX PADS: 6.0000 | MEDICATED_PAD | Freq: Once | CUTANEOUS | Status: DC

## 2016-03-23 MED ORDER — SODIUM CHLORIDE 0.9 % IV SOLN: INTRAVENOUS | Status: DC

## 2016-03-23 MED ORDER — SODIUM CHLORIDE 0.45 % IV SOLN
INTRAVENOUS | Status: DC
  Administered 2016-03-23: 10 mL/h via INTRAVENOUS

## 2016-03-23 MED ORDER — PROPOFOL 10 MG/ML IV BOLUS
INTRAVENOUS | Status: DC | PRN
  Administered 2016-03-23: 300 mg via INTRAVENOUS

## 2016-03-23 MED ORDER — FENTANYL CITRATE (PF) 100 MCG/2ML IJ SOLN
INTRAMUSCULAR | Status: DC | PRN
  Administered 2016-03-23: 50 ug via INTRAVENOUS
  Administered 2016-03-23: 150 ug via INTRAVENOUS

## 2016-03-23 SURGICAL SUPPLY — 53 items
ADH SKN CLS APL DERMABOND .7 (GAUZE/BANDAGES/DRESSINGS) ×4
BAG DECANTER FOR FLEXI CONT (MISCELLANEOUS) ×4 IMPLANT
BLADE SURG SZ11 CARB STEEL (BLADE) ×4 IMPLANT
BOOT SUTURE AID YELLOW STND (SUTURE) ×4 IMPLANT
BRUSH SCRUB 4% CHG (MISCELLANEOUS) ×4 IMPLANT
CANISTER SUCT 1200ML W/VALVE (MISCELLANEOUS) ×4 IMPLANT
CHLORAPREP W/TINT 26ML (MISCELLANEOUS) ×4 IMPLANT
CLIP SPRNG 6MM S-JAW DBL (CLIP) ×4
COVER PROBE FLX POLY STRL (MISCELLANEOUS) ×4 IMPLANT
DERMABOND ADVANCED (GAUZE/BANDAGES/DRESSINGS) ×4
DERMABOND ADVANCED .7 DNX12 (GAUZE/BANDAGES/DRESSINGS) ×4 IMPLANT
ELECT CAUTERY BLADE 6.4 (BLADE) ×4 IMPLANT
ELECT REM PT RETURN 9FT ADLT (ELECTROSURGICAL) ×4
ELECTRODE REM PT RTRN 9FT ADLT (ELECTROSURGICAL) ×2 IMPLANT
GEL ULTRASOUND 20GR AQUASONIC (MISCELLANEOUS) IMPLANT
GLOVE BIO SURGEON STRL SZ7 (GLOVE) ×20 IMPLANT
GLOVE INDICATOR 7.5 STRL GRN (GLOVE) ×4 IMPLANT
GOWN STRL REUS W/ TWL LRG LVL3 (GOWN DISPOSABLE) ×2 IMPLANT
GOWN STRL REUS W/ TWL XL LVL3 (GOWN DISPOSABLE) ×2 IMPLANT
GOWN STRL REUS W/TWL LRG LVL3 (GOWN DISPOSABLE) ×4
GOWN STRL REUS W/TWL XL LVL3 (GOWN DISPOSABLE) ×4
HEMOSTAT SURGICEL 2X3 (HEMOSTASIS) ×4 IMPLANT
IV NS 500ML (IV SOLUTION) ×4
IV NS 500ML BAXH (IV SOLUTION) ×2 IMPLANT
KIT RM TURNOVER STRD PROC AR (KITS) ×4 IMPLANT
LABEL OR SOLS (LABEL) ×4 IMPLANT
LOOP RED MAXI  1X406MM (MISCELLANEOUS) ×2
LOOP VESSEL MAXI 1X406 RED (MISCELLANEOUS) ×2 IMPLANT
LOOP VESSEL MINI 0.8X406 BLUE (MISCELLANEOUS) ×2 IMPLANT
LOOPS BLUE MINI 0.8X406MM (MISCELLANEOUS) ×2
NEEDLE FILTER BLUNT 18X 1/2SAF (NEEDLE) ×2
NEEDLE FILTER BLUNT 18X1 1/2 (NEEDLE) ×2 IMPLANT
NEEDLE HYPO 30X.5 LL (NEEDLE) IMPLANT
NS IRRIG 500ML POUR BTL (IV SOLUTION) ×4 IMPLANT
PACK EXTREMITY ARMC (MISCELLANEOUS) ×4 IMPLANT
PAD PREP 24X41 OB/GYN DISP (PERSONAL CARE ITEMS) ×4 IMPLANT
SOLUTION CELL SAVER (CLIP) ×2 IMPLANT
STOCKINETTE STRL 6IN 960660 (GAUZE/BANDAGES/DRESSINGS) ×4 IMPLANT
SUT MNCRL AB 4-0 PS2 18 (SUTURE) ×4 IMPLANT
SUT PROLENE 6 0 BV (SUTURE) ×16 IMPLANT
SUT SILK 2 0 (SUTURE) ×4
SUT SILK 2 0 SH (SUTURE) ×4 IMPLANT
SUT SILK 2-0 18XBRD TIE 12 (SUTURE) ×2 IMPLANT
SUT SILK 3 0 (SUTURE) ×8
SUT SILK 3-0 18XBRD TIE 12 (SUTURE) ×4 IMPLANT
SUT SILK 4 0 (SUTURE) ×4
SUT SILK 4-0 18XBRD TIE 12 (SUTURE) ×2 IMPLANT
SUT VIC AB 3-0 SH 27 (SUTURE) ×12
SUT VIC AB 3-0 SH 27X BRD (SUTURE) ×6 IMPLANT
SYR 20CC LL (SYRINGE) ×4 IMPLANT
SYR 3ML LL SCALE MARK (SYRINGE) ×4 IMPLANT
SYR TB 1ML 27GX1/2 LL (SYRINGE) ×4 IMPLANT
TOWEL OR 17X26 4PK STRL BLUE (TOWEL DISPOSABLE) IMPLANT

## 2016-03-23 NOTE — Anesthesia Postprocedure Evaluation (Signed)
Anesthesia Post Note  Patient: [name] [name]  Procedure(s) Performed: Procedure(s) (LRB): ARTERIOVENOUS (AV) FISTULA CREATION ( REVISION ) (Left) REMOVAL OF ARTERIOVENOUS GORETEX GRAFT (Conception) (Left)  Patient location during evaluation: PACU Anesthesia Type: General Level of consciousness: awake Pain management: pain level controlled Vital Signs Assessment: post-procedure vital signs reviewed and stable Respiratory status: spontaneous breathing Cardiovascular status: stable Anesthetic complications: no    Last Vitals:  Vitals:   03/23/16 1244 03/23/16 1246  BP: (!) 107/52 (!) 107/52  Pulse: (!) 101 (!) 101  Resp: (!) 25 (!) 25  Temp: 36.2 C     Last Pain:  Vitals:   03/23/16 1244  TempSrc:   PainSc: Asleep                 VAN STAVEREN,Cashawn Yanko

## 2016-03-23 NOTE — Discharge Instructions (Signed)

## 2016-03-23 NOTE — H&P (Signed)
[facility] Admission History & Physical  MRN : [id number]  [name] [name] is a 50 y.o. (03-04-1966) male who presents with chief complaint of No chief complaint on file. [name]  History of Present Illness: Patient with a left arm AVF which is not currently usable due to depth and overlying previous AVG which are occluded.  The patient currently has a catheter in place, but needs this access for permanent use.  No other complaints today.  Current Facility-Administered Medications  Medication Dose Route Frequency Provider Last Rate Last Dose  . 0.9 %  sodium chloride infusion   Intravenous Continuous Molli Barrows, MD      . ceFAZolin (ANCEF) 2-4 GM/100ML-% IVPB           . ceFAZolin (ANCEF) IVPB 2g/100 mL premix  2 g Intravenous On Call to [place], PA-C      . Chlorhexidine Gluconate Cloth 2 % PADS 6 each  6 each Topical Once [employer], PA-C       And  . Chlorhexidine Gluconate Cloth 2 % PADS 6 each  6 each Topical Once [employer], PA-C      . famotidine (PEPCID) 20 MG tablet           . famotidine (PEPCID) tablet 20 mg  20 mg Oral Once Molli Barrows, MD        Past Medical History:  Diagnosis Date  . Aneurysm ([place])     Right arm fistula 3 aneurysms 2011,   plans to have a new procedure  . Angina    occasional, last 6 mo ago  . Chest discomfort     2008  . CHF (congestive heart failure) ([place])   . Dialysis patient [facility])    M-W-F @ [place]  . Ejection fraction     65%, echo, 2008   /   EF 55-60%, echo, October 28, 2010  . ESRD (end stage renal disease) ([place])    on hemodialysis T_T_S  . Hypertension   . Leg pain   . Orthostatic hypotension     2008  . Overweight(278.02)   . Sinus tachycardia     2008, TSH normal  . Syncope     positional after dialysis... 2008    Past Surgical History:  Procedure Laterality Date  . ARTERIOVENOUS GRAFT PLACEMENT    . AV FISTULA PLACEMENT    . AV FISTULA PLACEMENT Left 11/18/2015   Procedure: ARTERIOVENOUS (AV) FISTULA CREATION ( RADIOCEPHALIC );  Surgeon: Algernon Huxley, MD;  Location: [facility];  Service: Vascular;  Laterality: Left;  . HERNIA REPAIR    . INSERTION OF DIALYSIS CATHETER  03/01/2011   Procedure: INSERTION OF DIALYSIS CATHETER;  Surgeon: Elam Dutch, MD;  Location: [facility];  Service: Vascular;  Laterality: Left;  Exchange of Dialysis Catheter to 27cm 15Fr. Arrow Catheter  . PERIPHERAL VASCULAR CATHETERIZATION N/A 09/01/2014   Procedure: A/V Shuntogram/Fistulagram;  Surgeon: Algernon Huxley, MD;  Location: [place] [facility];  Service: Cardiovascular;  Laterality: N/A;  . PERIPHERAL VASCULAR CATHETERIZATION Right 09/01/2014   Procedure: Thrombectomy;  Surgeon: Algernon Huxley, MD;  Location: [place] [facility];  Service: Cardiovascular;  Laterality: Right;  . PERIPHERAL VASCULAR CATHETERIZATION Right 09/01/2014   Procedure: A/V Shunt Intervention;  Surgeon: Algernon Huxley, MD;  Location: [place] [facility];  Service: Cardiovascular;  Laterality: Right;  . PERIPHERAL VASCULAR CATHETERIZATION N/A 09/17/2014   Procedure: A/V Shuntogram/Fistulagram;  Surgeon: Corene Cornea  Bunnie Domino, MD;  Location: [place] [facility];  Service: Cardiovascular;  Laterality: N/A;  . PERIPHERAL VASCULAR CATHETERIZATION Right 10/17/2014   Procedure: Thrombectomy;  Surgeon: Katha Cabal, MD;  Location: [place] [facility];  Service: Cardiovascular;  Laterality: Right;  . PERIPHERAL VASCULAR CATHETERIZATION N/A 10/17/2014   Procedure: A/V Shuntogram/Fistulagram;  Surgeon: Katha Cabal, MD;  Location: [place] [facility];  Service: Cardiovascular;  Laterality: N/A;  . PERIPHERAL VASCULAR CATHETERIZATION N/A 10/17/2014   Procedure: A/V Shunt Intervention;  Surgeon: Katha Cabal, MD;  Location: [place] [facility];  Service: Cardiovascular;  Laterality: N/A;  . PERIPHERAL VASCULAR CATHETERIZATION Right 11/04/2014   Procedure: Thrombectomy;  Surgeon: Katha Cabal, MD;  Location: [place] [facility];  Service: Cardiovascular;  Laterality: Right;  . PERIPHERAL VASCULAR CATHETERIZATION Left 11/04/2014   Procedure: Visceral Venography;  Surgeon: Katha Cabal, MD;  Location: [place] [facility];  Service: Cardiovascular;  Laterality: Left;  . PERIPHERAL VASCULAR CATHETERIZATION Right 11/27/2014   Procedure: A/V Shuntogram/Fistulagram;  Surgeon: Algernon Huxley, MD;  Location: [place] [facility];  Service: Cardiovascular;  Laterality: Right;  . PERIPHERAL VASCULAR CATHETERIZATION Right 11/27/2014   Procedure: A/V Shunt Intervention;  Surgeon: Algernon Huxley, MD;  Location: [place] [facility];  Service: Cardiovascular;  Laterality: Right;  . PERIPHERAL VASCULAR CATHETERIZATION Right 12/31/2014   Procedure: Thrombectomy;  Surgeon: Algernon Huxley, MD;  Location: [place] [facility];  Service: Cardiovascular;  Laterality: Right;  . PERIPHERAL VASCULAR CATHETERIZATION Right 01/12/2015   Procedure: A/V Shuntogram/Fistulagram;  Surgeon: Algernon Huxley, MD;  Location: [place] [facility];  Service: Cardiovascular;  Laterality: Right;  . PERIPHERAL VASCULAR CATHETERIZATION N/A 01/12/2015   Procedure: A/V Shunt Intervention;  Surgeon: Algernon Huxley, MD;  Location: [place] [facility];  Service: Cardiovascular;  Laterality: N/A;  . PERIPHERAL VASCULAR CATHETERIZATION Right 01/28/2015   Procedure: Thrombectomy;  Surgeon: Algernon Huxley, MD;  Location: [place] [facility];  Service: Cardiovascular;  Laterality: Right;  . PERIPHERAL VASCULAR CATHETERIZATION N/A 06/08/2015   Procedure: A/V Shuntogram/Fistulagram;  Surgeon: Algernon Huxley, MD;  Location: [place] [facility];  Service: Cardiovascular;  Laterality: N/A;  . PERIPHERAL VASCULAR CATHETERIZATION N/A 06/08/2015   Procedure: A/V Shunt Intervention;  Surgeon: Algernon Huxley, MD;  Location: [place] [facility];  Service: Cardiovascular;  Laterality: N/A;  . PERIPHERAL VASCULAR CATHETERIZATION Right 07/06/2015   Procedure: A/V Shuntogram/Fistulagram;  Surgeon: Algernon Huxley, MD;  Location: [place] [facility];  Service: Cardiovascular;  Laterality: Right;  . PERIPHERAL VASCULAR CATHETERIZATION N/A 07/06/2015   Procedure: A/V Shunt Intervention;  Surgeon: Algernon Huxley, MD;  Location: [place] [facility];  Service: Cardiovascular;  Laterality: N/A;  . PERIPHERAL VASCULAR CATHETERIZATION N/A 08/04/2015   Procedure: graft declot;  Surgeon: Katha Cabal, MD;  Location: [place] [facility];  Service: Cardiovascular;  Laterality: N/A;  . PERIPHERAL VASCULAR CATHETERIZATION Right 08/25/2015   Procedure: A/V Shuntogram/Fistulagram;  Surgeon: Katha Cabal, MD;  Location: [place] [facility];  Service: Cardiovascular;  Laterality: Right;  . PERIPHERAL VASCULAR CATHETERIZATION N/A 11/04/2015   Procedure: Dialysis/Perma Catheter Insertion;  Surgeon: Algernon Huxley, MD;  Location: [place] [facility];  Service: Cardiovascular;  Laterality: N/A;  . PERIPHERAL VASCULAR CATHETERIZATION Left 12/14/2015   Procedure: A/V Shuntogram/Fistulagram;  Surgeon: Algernon Huxley, MD;  Location: [place] [facility];  Service: Cardiovascular;  Laterality: Left;  . PERIPHERAL VASCULAR CATHETERIZATION N/A 12/14/2015   Procedure: A/V  Shunt Intervention;  Surgeon: Algernon Huxley, MD;  Location: [place] [facility];  Service: Cardiovascular;  Laterality: N/A;  . PERIPHERAL VASCULAR CATHETERIZATION N/A 12/17/2015   Procedure: Dialysis/Perma Catheter Insertion;  Surgeon: Algernon Huxley, MD;  Location: [place] [facility];  Service: Cardiovascular;  Laterality: N/A;  . PERIPHERAL VASCULAR CATHETERIZATION Left 12/22/2015   Procedure: Dialysis/Perma Catheter Insertion;  Surgeon: Katha Cabal, MD;  Location: [place] [facility];  Service: Cardiovascular;  Laterality: Left;  . PERIPHERAL VASCULAR CATHETERIZATION Left 02/29/2016   Procedure: A/V Shuntogram/Fistulagram;  Surgeon: Algernon Huxley, MD;  Location: [place] [facility];  Service: Cardiovascular;  Laterality: Left;  . PERIPHERAL VASCULAR  CATHETERIZATION N/A 02/29/2016   Procedure: A/V Shunt Intervention;  Surgeon: Algernon Huxley, MD;  Location: [place] [facility];  Service: Cardiovascular;  Laterality: N/A;  . right arm graft     for dyalisis  . THROMBECTOMY    . THROMBECTOMY W/ EMBOLECTOMY  03/01/2011   Procedure: THROMBECTOMY ARTERIOVENOUS GORE-TEX GRAFT;  Surgeon: Elam Dutch, MD;  Location: [place];  Service: Vascular;  Laterality: Right;  Attempted Thrombectomy of Old  Right Upper Arm Arteriovenous gortex Graft. Insertion of new Arteriovenous Graft using 84mm x 50cm Gortex Stretch graft.   . THROMBECTOMY W/ EMBOLECTOMY  07/11/2011   Procedure: THROMBECTOMY ARTERIOVENOUS GORE-TEX GRAFT;  Surgeon: Rosetta Posner, MD;  Location: [place];  Service: Vascular;  Laterality: Right;  . UMBILICAL HERNIA REPAIR    . VENOGRAM N/A 08/23/2011   Procedure: VENOGRAM;  Surgeon: [name], MD;  Location: [facility];  Service: Cardiovascular;  Laterality: N/A;    Social History Social History  Substance Use Topics  . Smoking status: Former Smoker    Types: Cigarettes    Quit date: 04/18/1994  . Smokeless tobacco: Current User    Types: Snuff     Comment: dips 1/2 snuff per day times 25 years  . Alcohol use No  Married  Family History Family History  Problem Relation Age of Onset  . Hypertension Mother   . Heart disease Mother   . Hypertension Father   . Heart disease Father   No bleeding or clotting disorders  No Known Allergies   REVIEW OF SYSTEMS (Negative unless checked)  Constitutional: [] Weight loss  [] Fever  [] Chills Cardiac: [] Chest pain   [] Chest pressure   [] Palpitations   [] Shortness of breath when laying flat   [] Shortness of breath at rest   [] Shortness of breath with exertion. Vascular:  [] Pain in legs with walking   [] Pain in legs at rest   [] Pain in legs when laying flat   [] Claudication   [] Pain in feet when walking  [] Pain in feet at rest  [] Pain in feet when laying flat   [] History of DVT   [] Phlebitis    [] Swelling in legs   [] Varicose veins   [] Non-healing ulcers Pulmonary:   [] Uses home oxygen   [] Productive cough   [] Hemoptysis   [] Wheeze  [] COPD   [] Asthma Neurologic:  [] Dizziness  [] Blackouts   [] Seizures   [] History of stroke   [] History of TIA  [] Aphasia   [] Temporary blindness   [] Dysphagia   [] Weakness or numbness in arms   [] Weakness or numbness in legs Musculoskeletal:  [] Arthritis   [] Joint swelling   [] Joint pain   [] Low back pain Hematologic:  [] Easy bruising  [] Easy bleeding   [] Hypercoagulable state   [] Anemic  [] Hepatitis Gastrointestinal:  [] Blood in stool   [] Vomiting blood  [] Gastroesophageal  reflux/heartburn   [] Difficulty swallowing. Genitourinary:  [x] Chronic kidney disease   [] Difficult urination  [] Frequent urination  [] Burning with urination   [] Blood in urine Skin:  [] Rashes   [] Ulcers   [] Wounds Psychological:  [] History of anxiety   []  History of major depression.  Physical Examination  Vitals:   03/23/16 0940  BP: (!) 150/86  Pulse: 94  Resp: 17  Temp: 98.9 F (37.2 C)  TempSrc: Tympanic  SpO2: 100%  Weight: (!) 154.2 kg (340 lb)  Height: 6' (1.829 m)   Body mass index is 46.11 kg/m. Gen: WD/WN, NAD Head: [place]/AT, No temporalis wasting. Prominent temp pulse not noted. Ear/Nose/Throat: Hearing grossly intact, nares w/o erythema or drainage, oropharynx w/o Erythema/Exudate,  Eyes: Conjunctiva clear, sclera non-icteric Neck: Trachea midline.  No JVD.  Pulmonary:  Good air movement, respirations not labored, no use of accessory muscles.  Cardiac: RRR, normal S1, S2. Vascular: thrill present in left arm AVF Vessel Right Left  Radial Palpable Palpable                                   Gastrointestinal: soft, non-tender/non-distended. No guarding/reflex.  Musculoskeletal: M/S 5/5 throughout.  Extremities without ischemic changes.  No deformity or atrophy.  Neurologic: Sensation grossly intact in extremities.  Symmetrical.  Speech is fluent. Motor  exam as listed above. Psychiatric: Judgment intact, Mood & affect appropriate for pt's clinical situation. Dermatologic: No rashes or ulcers noted.  No cellulitis or open wounds. Lymph : No Cervical, Axillary, or Inguinal lymphadenopathy.     CBC Lab Results  Component Value Date   WBC 8.7 03/16/2016   HGB 13.9 03/16/2016   HCT 43.8 03/16/2016   MCV 77.1 (L) 03/16/2016   PLT 286 03/16/2016    BMET    Component Value Date/Time   NA 140 03/16/2016 0948   NA 137 11/13/2013 0408   K 4.4 03/16/2016 0948   K 4.1 11/13/2013 0408   CL 99 (L) 03/16/2016 0948   CL 98 11/13/2013 0408   CO2 28 03/16/2016 0948   CO2 25 11/13/2013 0408   GLUCOSE 85 03/16/2016 0948   GLUCOSE 64 (L) 11/13/2013 0408   BUN 34 (H) 03/16/2016 0948   BUN 68 (H) 11/13/2013 0408   CREATININE 11.38 (H) 03/16/2016 0948   CREATININE 16.45 (H) 11/13/2013 0408   CALCIUM 8.6 (L) 03/16/2016 0948   CALCIUM 6.8 (LL) 11/13/2013 0408   GFRNONAA 5 (L) 03/16/2016 0948   GFRNONAA 3 (L) 11/13/2013 0408   GFRAA 5 (L) 03/16/2016 0948   GFRAA 3 (L) 11/13/2013 0408   Estimated Creatinine Clearance: 11.8 mL/min (by C-G formula based on SCr of 11.38 mg/dL (H)).  COAG Lab Results  Component Value Date   INR 1.26 03/16/2016   INR 1.25 11/13/2015   INR 1.05 08/08/2012    Radiology No results found.   Assessment/Plan 1. Nonfunctional AVF.  For revision today. Risks and benefits discussed 2. ESRD. Access issues as above.   3. HTN. Stable on outpatient medications   Leotis Pain, MD  03/23/2016 9:55 AM

## 2016-03-23 NOTE — Anesthesia Preprocedure Evaluation (Signed)
Anesthesia Evaluation  Patient identified by MRN, date of birth, ID band Patient awake    Reviewed: Allergy & Precautions, NPO status , Patient's Chart, lab work & pertinent test results  Airway Mallampati: II       Dental  (+) Teeth Intact   Pulmonary neg pulmonary ROS, former smoker,     + decreased breath sounds      Cardiovascular Exercise Tolerance: Good hypertension, Pt. on medications + angina +CHF   Rhythm:Regular Rate:Normal     Neuro/Psych negative neurological ROS     GI/Hepatic negative GI ROS, Neg liver ROS,   Endo/Other  Morbid obesity  Renal/GU DialysisRenal disease     Musculoskeletal   Abdominal (+) + obese,   Peds  Hematology   Anesthesia Other Findings   Reproductive/Obstetrics                             Anesthesia Physical Anesthesia Plan  ASA: IV  Anesthesia Plan: General   Post-op Pain Management:    Induction: Intravenous  Airway Management Planned: Oral ETT  Additional Equipment:   Intra-op Plan:   Post-operative Plan: Extubation in OR  Informed Consent: I have reviewed the patients History and Physical, chart, labs and discussed the procedure including the risks, benefits and alternatives for the proposed anesthesia with the patient or authorized representative who has indicated his/her understanding and acceptance.     Plan Discussed with: CRNA  Anesthesia Plan Comments:         Anesthesia Quick Evaluation

## 2016-03-23 NOTE — Anesthesia Procedure Notes (Signed)
Procedure Name: Intubation Performed by: Lance Muss Pre-anesthesia Checklist: Patient identified, Patient being monitored, Timeout performed, Emergency Drugs available and Suction available Patient Re-evaluated:Patient Re-evaluated prior to inductionOxygen Delivery Method: Circle system utilized Preoxygenation: Pre-oxygenation with 100% oxygen Intubation Type: IV induction Ventilation: Mask ventilation without difficulty, Oral airway inserted - appropriate to patient size and Two handed mask ventilation required Laryngoscope Size: Mac and 4 Grade View: Grade I Tube type: Oral Tube size: 7.5 mm Number of attempts: 1 Airway Equipment and Method: Stylet Placement Confirmation: ETT inserted through vocal cords under direct vision,  positive ETCO2 and breath sounds checked- equal and bilateral Secured at: 22 cm Tube secured with: Tape Dental Injury: Teeth and Oropharynx as per pre-operative assessment  Future Recommendations: Recommend- induction with short-acting agent, and alternative techniques readily available

## 2016-03-23 NOTE — Transfer of Care (Signed)
Immediate Anesthesia Transfer of Care Note  Patient: [name] [name]  Procedure(s) Performed: Procedure(s): ARTERIOVENOUS (AV) FISTULA CREATION ( REVISION ) (Left) REMOVAL OF ARTERIOVENOUS GORETEX GRAFT ([place]) (Left)  Patient Location: PACU  Anesthesia Type:General  Level of Consciousness: sedated and responds to stimulation  Airway & Oxygen Therapy: Patient Spontanous Breathing and Patient connected to face mask oxygen  Post-op Assessment: Report given to RN and Post -op Vital signs reviewed and stable  Post vital signs: Reviewed and stable  Last Vitals:  Vitals:   03/23/16 1244 03/23/16 1246  BP: (!) (P) 107/52 (!) 107/52  Pulse: (!) (P) 101 (!) 101  Resp: (!) (P) 25 (!) 25  Temp: (P) 36.2 C     Last Pain:  Vitals:   03/23/16 0940  TempSrc: Tympanic  PainSc: 0-No pain         Complications: No apparent anesthesia complications

## 2016-03-23 NOTE — Op Note (Signed)
    OPERATIVE NOTE   PROCEDURE: Left arm radiocephalic AVF revision/superficialization Resection of left forearm AVG  PRE-OPERATIVE DIAGNOSIS: ESRD, non-useable left radiocephalic AVF  POST-OPERATIVE DIAGNOSIS: same as above  SURGEON: Leotis Pain, MD  ASSISTANT(S): none  ANESTHESIA: general  ESTIMATED BLOOD LOSS: 75 cc  FINDING(S): None  SPECIMEN(S):  Old left arm AV graft  INDICATIONS:   Called patient. Advised patient of provider's approval for requested procedure, as well as any comments/instructions from provider.   Provided patient w/ verbal instructions concerning pre-, intra- and post-procedure preparation and instructions.  Patient verbalized understanding of the above.      is a 50 y.o. male who presents with end-stage renal disease and is status post an initial left radiocephalic AV fistula. The vein is deep and is not in a position that it can be used for dialysis. He also has an old loop forearm AVG that is overlying the AVF and limiting its use. The patient is scheduled for revision to make this more superficial and to remove the old graft overlying the AVF.  The patient is aware the risks include but are not limited to: bleeding, infection, steal syndrome, nerve damage, ischemic monomelic neuropathy, failure to mature, and need for additional procedures.  The patient is aware of the risks of the procedure and elects to proceed forward.  DESCRIPTION: After full informed written consent was obtained from the patient, the patient was brought back to the operating room and placed supine upon the operating table.  Prior to induction, the patient received IV antibiotics.   After obtaining adequate anesthesia, the patient was then prepped and draped in the standard fashion.  I turned my attention first to identifying the patient's left radiocephalic arteriovenous fistula.  I made an longitudinal incision over the fistula from its arterial anastomosis up to its antecubital  extent.  I carefully dissected the fistula away from its adjacent nerves.  Eventually the entirety of this fistula was mobilized and I dissected a plane on top of the fascia with electrocautery. Several venous branches were ligated and divided between silk ties to facilitate the exposure and superficialization of the fistula, as well as to remove competitive flow. The vein was marked for orientation, and topical papavirine was used for vasospasm twice. The patient was given 3000 units of intravenous heparin. The old AVG was clearly in the way, and the lateral segment was removed up near the antecubital fossa.  The medial portion was removed to several cm away from the AVF. The vein was then brought up to the surface.  A palpable thrill could be felt within the cephalic vein.  The deep subcutaneous tissue was inspected for bleeding. I washed out the surgical site after waiting a few minutes, and there was no further bleeding.  Bleeding was controlled with electrocautery. The fascia was reapproximated with interrupted stitches of 2-0 Vicryl to eliminate some of the deep space.  The superficial subcutaneous tissue was then reapproximated along the incision line with a running stitch of 3-0 Vicryl.  The skin was then reapproximated with a running subcuticular of 4-0 Monocryl.  The skin was then cleaned, dried, and reinforced with Dermabond.  The patient tolerated this procedure well and was taken to the recovery room in stable condition.  COMPLICATIONS: None  CONDITION: Stable    Leotis Pain  03/23/2016, 12:35 PM      This note was created with Dragon Medical transcription system. Any errors in dictation are purely unintentional.

## 2016-03-24 DIAGNOSIS — Z992 Dependence on renal dialysis: Secondary | ICD-10-CM | POA: Diagnosis not present

## 2016-03-24 DIAGNOSIS — E611 Iron deficiency: Secondary | ICD-10-CM | POA: Diagnosis not present

## 2016-03-24 DIAGNOSIS — N186 End stage renal disease: Secondary | ICD-10-CM | POA: Diagnosis not present

## 2016-03-24 DIAGNOSIS — N2581 Secondary hyperparathyroidism of renal origin: Secondary | ICD-10-CM | POA: Diagnosis not present

## 2016-03-24 LAB — SURGICAL PATHOLOGY

## 2016-03-26 DIAGNOSIS — E611 Iron deficiency: Secondary | ICD-10-CM | POA: Diagnosis not present

## 2016-03-26 DIAGNOSIS — Z992 Dependence on renal dialysis: Secondary | ICD-10-CM | POA: Diagnosis not present

## 2016-03-26 DIAGNOSIS — N2581 Secondary hyperparathyroidism of renal origin: Secondary | ICD-10-CM | POA: Diagnosis not present

## 2016-03-26 DIAGNOSIS — N186 End stage renal disease: Secondary | ICD-10-CM | POA: Diagnosis not present

## 2016-03-28 ENCOUNTER — Telehealth (INDEPENDENT_AMBULATORY_CARE_PROVIDER_SITE_OTHER): Payer: Self-pay

## 2016-03-28 NOTE — Telephone Encounter (Signed)
Patient wife called stating that his arm has been bleeding the last couple of days, but today it is bleeding with pus. Wants to know what should be done? [phone] is the call back number if an appointment is necessary.

## 2016-03-29 DIAGNOSIS — N2581 Secondary hyperparathyroidism of renal origin: Secondary | ICD-10-CM | POA: Diagnosis not present

## 2016-03-29 DIAGNOSIS — N186 End stage renal disease: Secondary | ICD-10-CM | POA: Diagnosis not present

## 2016-03-29 DIAGNOSIS — Z992 Dependence on renal dialysis: Secondary | ICD-10-CM | POA: Diagnosis not present

## 2016-03-29 DIAGNOSIS — E611 Iron deficiency: Secondary | ICD-10-CM | POA: Diagnosis not present

## 2016-03-31 DIAGNOSIS — Z992 Dependence on renal dialysis: Secondary | ICD-10-CM | POA: Diagnosis not present

## 2016-03-31 DIAGNOSIS — E611 Iron deficiency: Secondary | ICD-10-CM | POA: Diagnosis not present

## 2016-03-31 DIAGNOSIS — N186 End stage renal disease: Secondary | ICD-10-CM | POA: Diagnosis not present

## 2016-03-31 DIAGNOSIS — N2581 Secondary hyperparathyroidism of renal origin: Secondary | ICD-10-CM | POA: Diagnosis not present

## 2016-04-02 DIAGNOSIS — N186 End stage renal disease: Secondary | ICD-10-CM | POA: Diagnosis not present

## 2016-04-02 DIAGNOSIS — Z992 Dependence on renal dialysis: Secondary | ICD-10-CM | POA: Diagnosis not present

## 2016-04-02 DIAGNOSIS — N2581 Secondary hyperparathyroidism of renal origin: Secondary | ICD-10-CM | POA: Diagnosis not present

## 2016-04-02 DIAGNOSIS — E611 Iron deficiency: Secondary | ICD-10-CM | POA: Diagnosis not present

## 2016-04-05 DIAGNOSIS — N2581 Secondary hyperparathyroidism of renal origin: Secondary | ICD-10-CM | POA: Diagnosis not present

## 2016-04-05 DIAGNOSIS — E611 Iron deficiency: Secondary | ICD-10-CM | POA: Diagnosis not present

## 2016-04-05 DIAGNOSIS — Z992 Dependence on renal dialysis: Secondary | ICD-10-CM | POA: Diagnosis not present

## 2016-04-05 DIAGNOSIS — N186 End stage renal disease: Secondary | ICD-10-CM | POA: Diagnosis not present

## 2016-04-06 ENCOUNTER — Encounter (INDEPENDENT_AMBULATORY_CARE_PROVIDER_SITE_OTHER): Payer: Self-pay | Admitting: Vascular Surgery

## 2016-04-06 ENCOUNTER — Ambulatory Visit (INDEPENDENT_AMBULATORY_CARE_PROVIDER_SITE_OTHER): Payer: Medicare Other | Admitting: Vascular Surgery

## 2016-04-06 VITALS — BP 97/55 | HR 122 | Resp 16 | Ht 72.0 in | Wt 339.0 lb

## 2016-04-06 DIAGNOSIS — L309 Dermatitis, unspecified: Secondary | ICD-10-CM

## 2016-04-06 DIAGNOSIS — N186 End stage renal disease: Secondary | ICD-10-CM

## 2016-04-06 NOTE — Progress Notes (Signed)
Subjective:    Patient ID: [name] [name], male    DOB: [birth year], 50 y.o.   MRN: [id number] Chief Complaint  Patient presents with  . Routine Post Op    2 week post op    Patient presents for his first post-operative follow up. He is s/p a left arm radiocephalic AVF revision / superficialization and resection of his left forearm AVG on 03/23/16. His post-operative course have been uneventful with the exception of some dermatitis forming around his right permcath site. Started about a week ago. Denies any fever, nausea or vomiting. No issues with his incision. The patient denies any fistula skin breakdown, pain, edema, pallor or ulceration of the arm / hand. Patient given Augmentin by nephrologist for dermatitis.    Review of Systems  Constitutional: Negative.   HENT: Negative.   Eyes: Negative.   Respiratory: Negative.   Cardiovascular: Negative.   Gastrointestinal: Negative.   Endocrine: Negative.   Genitourinary:       ESRD  Musculoskeletal: Negative.   Skin:       Rash around permcath  Allergic/Immunologic: Negative.   Neurological: Negative.   Hematological: Negative.   Psychiatric/Behavioral: Negative.       Objective:   Physical Exam  Constitutional: He is oriented to person, place, and time. He appears well-developed and well-nourished.  HENT:  Head: Normocephalic and atraumatic.  Right Ear: External ear normal.  Left Ear: External ear normal.  Eyes: Conjunctivae and EOM are normal. Pupils are equal, round, and reactive to light.  Neck: Normal range of motion.  Cardiovascular: Normal rate, regular rhythm, normal heart sounds and intact distal pulses.   Pulses:      Radial pulses are 2+ on the right side, and 2+ on the left side.       Dorsalis pedis pulses are 2+ on the right side, and 2+ on the left side.       Posterior tibial pulses are 2+ on the right side, and 2+ on the left side.  Left Upper Extremity Access: Incision healing well. Good bruit and thrill.    Pulmonary/Chest: Effort normal and breath sounds normal.  Abdominal: Soft. Bowel sounds are normal.  Musculoskeletal: Normal range of motion. He exhibits no edema.  Neurological: He is alert and oriented to person, place, and time.  Skin:     Dermatitis noted around permcath site. Skin intact. No infection noted.    BP (!) 97/55 (BP Location: Right Arm)   Pulse (!) 122   Resp 16   Ht 6' (1.829 m)   Wt (!) 339 lb (153.8 kg)   BMI 45.98 kg/m   Past Medical History:  Diagnosis Date  . Aneurysm ([place])     Right arm fistula 3 aneurysms 2011,   plans to have a new procedure  . Angina    occasional, last 6 mo ago  . Chest discomfort     2008  . CHF (congestive heart failure) ([place])   . Dialysis patient [facility])    M-W-F @ [place]  . Dysrhythmia   . Ejection fraction     65%, echo, 2008   /   EF 55-60%, echo, October 28, 2010  . ESRD (end stage renal disease) ([place])    on hemodialysis T_T_S  . Hypertension   . Leg pain   . Orthostatic hypotension     2008  . Overweight(278.02)   . Sinus tachycardia     2008, TSH normal ... happens frequently with heart rate at 120-130  .  Syncope     positional after dialysis... 2008   Social History   Social History  . Marital status: Married    Spouse name: N/A  . Number of children: N/A  . Years of education: N/A   Occupational History  . Not on file.   Social History Main Topics  . Smoking status: Former Smoker    Types: Cigarettes    Quit date: 04/18/1994  . Smokeless tobacco: Current User    Types: Snuff     Comment: dips 1/2 snuff per day times 25 years  . Alcohol use No  . Drug use: No  . Sexual activity: Not on file   Other Topics Concern  . Not on file   Social History Narrative   Single   Past Surgical History:  Procedure Laterality Date  . ARTERIOVENOUS GRAFT PLACEMENT    . AV FISTULA PLACEMENT    . AV FISTULA PLACEMENT Left 11/18/2015   Procedure: ARTERIOVENOUS (AV) FISTULA CREATION ( RADIOCEPHALIC );  Surgeon:  Algernon Huxley, MD;  Location: [facility];  Service: Vascular;  Laterality: Left;  . AV FISTULA PLACEMENT Left 03/23/2016   Procedure: ARTERIOVENOUS (AV) FISTULA CREATION ( REVISION );  Surgeon: Algernon Huxley, MD;  Location: [facility];  Service: Vascular;  Laterality: Left;  . [place] REMOVAL Left 03/23/2016   Procedure: REMOVAL OF ARTERIOVENOUS GORETEX GRAFT ([place]);  Surgeon: Algernon Huxley, MD;  Location: [facility];  Service: Vascular;  Laterality: Left;  . HERNIA REPAIR    . INSERTION OF DIALYSIS CATHETER  03/01/2011   Procedure: INSERTION OF DIALYSIS CATHETER;  Surgeon: Elam Dutch, MD;  Location: [place];  Service: Vascular;  Laterality: Left;  Exchange of Dialysis Catheter to 27cm 15Fr. Arrow Catheter  . PERIPHERAL VASCULAR CATHETERIZATION N/A 09/01/2014   Procedure: A/V Shuntogram/Fistulagram;  Surgeon: Algernon Huxley, MD;  Location: [place] [facility];  Service: Cardiovascular;  Laterality: N/A;  . PERIPHERAL VASCULAR CATHETERIZATION Right 09/01/2014   Procedure: Thrombectomy;  Surgeon: Algernon Huxley, MD;  Location: [place] [facility];  Service: Cardiovascular;  Laterality: Right;  . PERIPHERAL VASCULAR CATHETERIZATION Right 09/01/2014   Procedure: A/V Shunt Intervention;  Surgeon: Algernon Huxley, MD;  Location: [place] [facility];  Service: Cardiovascular;  Laterality: Right;  . PERIPHERAL VASCULAR CATHETERIZATION N/A 09/17/2014   Procedure: A/V Shuntogram/Fistulagram;  Surgeon: Algernon Huxley, MD;  Location: [place] [facility];  Service: Cardiovascular;  Laterality: N/A;  . PERIPHERAL VASCULAR CATHETERIZATION Right 10/17/2014   Procedure: Thrombectomy;  Surgeon: Katha Cabal, MD;  Location: [place] [facility];  Service: Cardiovascular;  Laterality: Right;  . PERIPHERAL VASCULAR CATHETERIZATION N/A 10/17/2014   Procedure: A/V Shuntogram/Fistulagram;  Surgeon: Katha Cabal, MD;  Location: [place] [facility];  Service: Cardiovascular;  Laterality: N/A;  . PERIPHERAL VASCULAR CATHETERIZATION N/A  10/17/2014   Procedure: A/V Shunt Intervention;  Surgeon: Katha Cabal, MD;  Location: [place] [facility];  Service: Cardiovascular;  Laterality: N/A;  . PERIPHERAL VASCULAR CATHETERIZATION Right 11/04/2014   Procedure: Thrombectomy;  Surgeon: Katha Cabal, MD;  Location: [place] [facility];  Service: Cardiovascular;  Laterality: Right;  . PERIPHERAL VASCULAR CATHETERIZATION Left 11/04/2014   Procedure: Visceral Venography;  Surgeon: Katha Cabal, MD;  Location: [place] [facility];  Service: Cardiovascular;  Laterality: Left;  . PERIPHERAL VASCULAR CATHETERIZATION Right 11/27/2014   Procedure: A/V Shuntogram/Fistulagram;  Surgeon: Algernon Huxley, MD;  Location: [place] [facility];  Service: Cardiovascular;  Laterality: Right;  .  PERIPHERAL VASCULAR CATHETERIZATION Right 11/27/2014   Procedure: A/V Shunt Intervention;  Surgeon: Algernon Huxley, MD;  Location: [place] [facility];  Service: Cardiovascular;  Laterality: Right;  . PERIPHERAL VASCULAR CATHETERIZATION Right 12/31/2014   Procedure: Thrombectomy;  Surgeon: Algernon Huxley, MD;  Location: [place] [facility];  Service: Cardiovascular;  Laterality: Right;  . PERIPHERAL VASCULAR CATHETERIZATION Right 01/12/2015   Procedure: A/V Shuntogram/Fistulagram;  Surgeon: Algernon Huxley, MD;  Location: [place] [facility];  Service: Cardiovascular;  Laterality: Right;  . PERIPHERAL VASCULAR CATHETERIZATION N/A 01/12/2015   Procedure: A/V Shunt Intervention;  Surgeon: Algernon Huxley, MD;  Location: [place] [facility];  Service: Cardiovascular;  Laterality: N/A;  . PERIPHERAL VASCULAR CATHETERIZATION Right 01/28/2015   Procedure: Thrombectomy;  Surgeon: Algernon Huxley, MD;  Location: [place] [facility];  Service: Cardiovascular;  Laterality: Right;  . PERIPHERAL VASCULAR CATHETERIZATION N/A 06/08/2015   Procedure: A/V Shuntogram/Fistulagram;  Surgeon: Algernon Huxley, MD;  Location: [place] [facility];  Service: Cardiovascular;  Laterality: N/A;  .  PERIPHERAL VASCULAR CATHETERIZATION N/A 06/08/2015   Procedure: A/V Shunt Intervention;  Surgeon: Algernon Huxley, MD;  Location: [place] [facility];  Service: Cardiovascular;  Laterality: N/A;  . PERIPHERAL VASCULAR CATHETERIZATION Right 07/06/2015   Procedure: A/V Shuntogram/Fistulagram;  Surgeon: Algernon Huxley, MD;  Location: [place] [facility];  Service: Cardiovascular;  Laterality: Right;  . PERIPHERAL VASCULAR CATHETERIZATION N/A 07/06/2015   Procedure: A/V Shunt Intervention;  Surgeon: Algernon Huxley, MD;  Location: [place] [facility];  Service: Cardiovascular;  Laterality: N/A;  . PERIPHERAL VASCULAR CATHETERIZATION N/A 08/04/2015   Procedure: graft declot;  Surgeon: Katha Cabal, MD;  Location: [place] [facility];  Service: Cardiovascular;  Laterality: N/A;  . PERIPHERAL VASCULAR CATHETERIZATION Right 08/25/2015   Procedure: A/V Shuntogram/Fistulagram;  Surgeon: Katha Cabal, MD;  Location: [place] [facility];  Service: Cardiovascular;  Laterality: Right;  . PERIPHERAL VASCULAR CATHETERIZATION N/A 11/04/2015   Procedure: Dialysis/Perma Catheter Insertion;  Surgeon: Algernon Huxley, MD;  Location: [place] [facility];  Service: Cardiovascular;  Laterality: N/A;  . PERIPHERAL VASCULAR CATHETERIZATION Left 12/14/2015   Procedure: A/V Shuntogram/Fistulagram;  Surgeon: Algernon Huxley, MD;  Location: [place] [facility];  Service: Cardiovascular;  Laterality: Left;  . PERIPHERAL VASCULAR CATHETERIZATION N/A 12/14/2015   Procedure: A/V Shunt Intervention;  Surgeon: Algernon Huxley, MD;  Location: [place] [facility];  Service: Cardiovascular;  Laterality: N/A;  . PERIPHERAL VASCULAR CATHETERIZATION N/A 12/17/2015   Procedure: Dialysis/Perma Catheter Insertion;  Surgeon: Algernon Huxley, MD;  Location: [place] [facility];  Service: Cardiovascular;  Laterality: N/A;  . PERIPHERAL VASCULAR CATHETERIZATION Left 12/22/2015   Procedure: Dialysis/Perma Catheter Insertion;  Surgeon: Katha Cabal, MD;   Location: [place] [facility];  Service: Cardiovascular;  Laterality: Left;  . PERIPHERAL VASCULAR CATHETERIZATION Left 02/29/2016   Procedure: A/V Shuntogram/Fistulagram;  Surgeon: Algernon Huxley, MD;  Location: [place] [facility];  Service: Cardiovascular;  Laterality: Left;  . PERIPHERAL VASCULAR CATHETERIZATION N/A 02/29/2016   Procedure: A/V Shunt Intervention;  Surgeon: Algernon Huxley, MD;  Location: [place] [facility];  Service: Cardiovascular;  Laterality: N/A;  . right arm graft     for dyalisis  . THROMBECTOMY    . THROMBECTOMY W/ EMBOLECTOMY  03/01/2011   Procedure: THROMBECTOMY ARTERIOVENOUS GORE-TEX GRAFT;  Surgeon: Elam Dutch, MD;  Location: [place];  Service: Vascular;  Laterality: Right;  Attempted Thrombectomy of Old  Right  Upper Arm Arteriovenous gortex Graft. Insertion of new Arteriovenous Graft using 21mm x 50cm Gortex Stretch graft.   . THROMBECTOMY W/ EMBOLECTOMY  07/11/2011   Procedure: THROMBECTOMY ARTERIOVENOUS GORE-TEX GRAFT;  Surgeon: Rosetta Posner, MD;  Location: [place];  Service: Vascular;  Laterality: Right;  . UMBILICAL HERNIA REPAIR    . VENOGRAM N/A 08/23/2011   Procedure: VENOGRAM;  Surgeon: [name], MD;  Location: [facility] LAB;  Service: Cardiovascular;  Laterality: N/A;   Family History  Problem Relation Age of Onset  . Hypertension Mother   . Heart disease Mother   . Hypertension Father   . Heart disease Father    No Known Allergies     Assessment & Plan:  Patient presents for his first post-operative follow up. He is s/p a left arm radiocephalic AVF revision / superficialization and resection of his left forearm AVG on 03/23/16. His post-operative course have been uneventful with the exception of some dermatitis forming around his right permcath site. Started about a week ago. Denies any fever, nausea or vomiting. No issues with his incision. The patient denies any fistula skin breakdown, pain, edema, pallor or ulceration of the arm / hand. Patient  given Augmentin by nephrologist for dermatitis.   1. ESRD (end stage renal disease) ([place]) s/p a left arm radiocephalic AVF revision / superficialization and resection of his left forearm AVG on 03/23/16 - New Fistula not mature yet to start to access. Will bring back in four weeks for HDA  2. Dermatitis - New Patient already on Augmentin. Added triamcinolone cream 0.5% BID Patient to call by end of week if no improvement.   Current Outpatient Prescriptions on File Prior to Visit  Medication Sig Dispense Refill  . apixaban (ELIQUIS) 5 MG TABS tablet Take 5 mg by mouth 2 (two) times daily.    [name] aspirin EC 81 MG tablet Take 81 mg by mouth every morning.    . B Complex-C-Folic Acid (NEPHRO-VITE PO) Take 1 tablet by mouth daily.      . calcium acetate (PHOSLO) 667 MG capsule Take 2,001 mg by mouth See admin instructions. 1334 mg 3 times a day with meals and 667 mg with snacks    . cinacalcet (SENSIPAR) 90 MG tablet Take 90 mg by mouth daily.    [name] FOSRENOL 500 MG chewable tablet Chew 500 mg by mouth 3 (three) times daily with meals. Reported on 11/04/2015    . HYDROcodone-acetaminophen (NORCO) 5-325 MG tablet Take 1 tablet by mouth every 6 (six) hours as needed for moderate pain. 30 tablet 0  . midodrine (PROAMATINE) 5 MG tablet Take 1 tablet by mouth daily as needed. Pt may take after dialysis treatment  4  . nitroGLYCERIN (NITROSTAT) 0.4 MG SL tablet Place 1 tablet (0.4 mg total) under the tongue every 5 (five) minutes as needed. For chest 45 tablet 3   No current facility-administered medications on file prior to visit.     There are no Patient Instructions on file for this visit. No Follow-up on file.   KIMBERLY A STEGMAYER, PA-C

## 2016-04-07 DIAGNOSIS — N2581 Secondary hyperparathyroidism of renal origin: Secondary | ICD-10-CM | POA: Diagnosis not present

## 2016-04-07 DIAGNOSIS — E611 Iron deficiency: Secondary | ICD-10-CM | POA: Diagnosis not present

## 2016-04-07 DIAGNOSIS — Z992 Dependence on renal dialysis: Secondary | ICD-10-CM | POA: Diagnosis not present

## 2016-04-07 DIAGNOSIS — N186 End stage renal disease: Secondary | ICD-10-CM | POA: Diagnosis not present

## 2016-04-09 DIAGNOSIS — N2581 Secondary hyperparathyroidism of renal origin: Secondary | ICD-10-CM | POA: Diagnosis not present

## 2016-04-09 DIAGNOSIS — N186 End stage renal disease: Secondary | ICD-10-CM | POA: Diagnosis not present

## 2016-04-09 DIAGNOSIS — E611 Iron deficiency: Secondary | ICD-10-CM | POA: Diagnosis not present

## 2016-04-09 DIAGNOSIS — Z992 Dependence on renal dialysis: Secondary | ICD-10-CM | POA: Diagnosis not present

## 2016-04-12 DIAGNOSIS — E611 Iron deficiency: Secondary | ICD-10-CM | POA: Diagnosis not present

## 2016-04-12 DIAGNOSIS — N2581 Secondary hyperparathyroidism of renal origin: Secondary | ICD-10-CM | POA: Diagnosis not present

## 2016-04-12 DIAGNOSIS — Z992 Dependence on renal dialysis: Secondary | ICD-10-CM | POA: Diagnosis not present

## 2016-04-12 DIAGNOSIS — N186 End stage renal disease: Secondary | ICD-10-CM | POA: Diagnosis not present

## 2016-04-14 DIAGNOSIS — Z992 Dependence on renal dialysis: Secondary | ICD-10-CM | POA: Diagnosis not present

## 2016-04-14 DIAGNOSIS — E611 Iron deficiency: Secondary | ICD-10-CM | POA: Diagnosis not present

## 2016-04-14 DIAGNOSIS — N186 End stage renal disease: Secondary | ICD-10-CM | POA: Diagnosis not present

## 2016-04-14 DIAGNOSIS — N2581 Secondary hyperparathyroidism of renal origin: Secondary | ICD-10-CM | POA: Diagnosis not present

## 2016-04-16 DIAGNOSIS — Z992 Dependence on renal dialysis: Secondary | ICD-10-CM | POA: Diagnosis not present

## 2016-04-16 DIAGNOSIS — N186 End stage renal disease: Secondary | ICD-10-CM | POA: Diagnosis not present

## 2016-04-16 DIAGNOSIS — N2581 Secondary hyperparathyroidism of renal origin: Secondary | ICD-10-CM | POA: Diagnosis not present

## 2016-04-16 DIAGNOSIS — E611 Iron deficiency: Secondary | ICD-10-CM | POA: Diagnosis not present

## 2016-04-17 DIAGNOSIS — Z992 Dependence on renal dialysis: Secondary | ICD-10-CM | POA: Diagnosis not present

## 2016-04-17 DIAGNOSIS — N186 End stage renal disease: Secondary | ICD-10-CM | POA: Diagnosis not present

## 2016-04-19 DIAGNOSIS — N2581 Secondary hyperparathyroidism of renal origin: Secondary | ICD-10-CM | POA: Diagnosis not present

## 2016-04-19 DIAGNOSIS — Z992 Dependence on renal dialysis: Secondary | ICD-10-CM | POA: Diagnosis not present

## 2016-04-19 DIAGNOSIS — T82898A Other specified complication of vascular prosthetic devices, implants and grafts, initial encounter: Secondary | ICD-10-CM | POA: Diagnosis not present

## 2016-04-19 DIAGNOSIS — E611 Iron deficiency: Secondary | ICD-10-CM | POA: Diagnosis not present

## 2016-04-19 DIAGNOSIS — N186 End stage renal disease: Secondary | ICD-10-CM | POA: Diagnosis not present

## 2016-04-20 ENCOUNTER — Ambulatory Visit (INDEPENDENT_AMBULATORY_CARE_PROVIDER_SITE_OTHER): Payer: Medicare Other | Admitting: Vascular Surgery

## 2016-04-21 DIAGNOSIS — Z992 Dependence on renal dialysis: Secondary | ICD-10-CM | POA: Diagnosis not present

## 2016-04-21 DIAGNOSIS — N186 End stage renal disease: Secondary | ICD-10-CM | POA: Diagnosis not present

## 2016-04-21 DIAGNOSIS — E611 Iron deficiency: Secondary | ICD-10-CM | POA: Diagnosis not present

## 2016-04-21 DIAGNOSIS — N2581 Secondary hyperparathyroidism of renal origin: Secondary | ICD-10-CM | POA: Diagnosis not present

## 2016-04-21 DIAGNOSIS — T82898A Other specified complication of vascular prosthetic devices, implants and grafts, initial encounter: Secondary | ICD-10-CM | POA: Diagnosis not present

## 2016-04-23 DIAGNOSIS — Z992 Dependence on renal dialysis: Secondary | ICD-10-CM | POA: Diagnosis not present

## 2016-04-23 DIAGNOSIS — T82898A Other specified complication of vascular prosthetic devices, implants and grafts, initial encounter: Secondary | ICD-10-CM | POA: Diagnosis not present

## 2016-04-23 DIAGNOSIS — N186 End stage renal disease: Secondary | ICD-10-CM | POA: Diagnosis not present

## 2016-04-23 DIAGNOSIS — E611 Iron deficiency: Secondary | ICD-10-CM | POA: Diagnosis not present

## 2016-04-23 DIAGNOSIS — N2581 Secondary hyperparathyroidism of renal origin: Secondary | ICD-10-CM | POA: Diagnosis not present

## 2016-04-26 DIAGNOSIS — E611 Iron deficiency: Secondary | ICD-10-CM | POA: Diagnosis not present

## 2016-04-26 DIAGNOSIS — N2581 Secondary hyperparathyroidism of renal origin: Secondary | ICD-10-CM | POA: Diagnosis not present

## 2016-04-26 DIAGNOSIS — Z992 Dependence on renal dialysis: Secondary | ICD-10-CM | POA: Diagnosis not present

## 2016-04-26 DIAGNOSIS — N186 End stage renal disease: Secondary | ICD-10-CM | POA: Diagnosis not present

## 2016-04-26 DIAGNOSIS — T82898A Other specified complication of vascular prosthetic devices, implants and grafts, initial encounter: Secondary | ICD-10-CM | POA: Diagnosis not present

## 2016-04-28 DIAGNOSIS — E611 Iron deficiency: Secondary | ICD-10-CM | POA: Diagnosis not present

## 2016-04-28 DIAGNOSIS — Z992 Dependence on renal dialysis: Secondary | ICD-10-CM | POA: Diagnosis not present

## 2016-04-28 DIAGNOSIS — N186 End stage renal disease: Secondary | ICD-10-CM | POA: Diagnosis not present

## 2016-04-28 DIAGNOSIS — N2581 Secondary hyperparathyroidism of renal origin: Secondary | ICD-10-CM | POA: Diagnosis not present

## 2016-04-28 DIAGNOSIS — T82898A Other specified complication of vascular prosthetic devices, implants and grafts, initial encounter: Secondary | ICD-10-CM | POA: Diagnosis not present

## 2016-04-30 DIAGNOSIS — T82898A Other specified complication of vascular prosthetic devices, implants and grafts, initial encounter: Secondary | ICD-10-CM | POA: Diagnosis not present

## 2016-04-30 DIAGNOSIS — N2581 Secondary hyperparathyroidism of renal origin: Secondary | ICD-10-CM | POA: Diagnosis not present

## 2016-04-30 DIAGNOSIS — Z992 Dependence on renal dialysis: Secondary | ICD-10-CM | POA: Diagnosis not present

## 2016-04-30 DIAGNOSIS — E611 Iron deficiency: Secondary | ICD-10-CM | POA: Diagnosis not present

## 2016-04-30 DIAGNOSIS — N186 End stage renal disease: Secondary | ICD-10-CM | POA: Diagnosis not present

## 2016-05-03 DIAGNOSIS — E611 Iron deficiency: Secondary | ICD-10-CM | POA: Diagnosis not present

## 2016-05-03 DIAGNOSIS — N186 End stage renal disease: Secondary | ICD-10-CM | POA: Diagnosis not present

## 2016-05-03 DIAGNOSIS — T82898A Other specified complication of vascular prosthetic devices, implants and grafts, initial encounter: Secondary | ICD-10-CM | POA: Diagnosis not present

## 2016-05-03 DIAGNOSIS — N2581 Secondary hyperparathyroidism of renal origin: Secondary | ICD-10-CM | POA: Diagnosis not present

## 2016-05-03 DIAGNOSIS — Z992 Dependence on renal dialysis: Secondary | ICD-10-CM | POA: Diagnosis not present

## 2016-05-07 DIAGNOSIS — N186 End stage renal disease: Secondary | ICD-10-CM | POA: Diagnosis not present

## 2016-05-07 DIAGNOSIS — N2581 Secondary hyperparathyroidism of renal origin: Secondary | ICD-10-CM | POA: Diagnosis not present

## 2016-05-07 DIAGNOSIS — T82898A Other specified complication of vascular prosthetic devices, implants and grafts, initial encounter: Secondary | ICD-10-CM | POA: Diagnosis not present

## 2016-05-07 DIAGNOSIS — Z992 Dependence on renal dialysis: Secondary | ICD-10-CM | POA: Diagnosis not present

## 2016-05-07 DIAGNOSIS — E611 Iron deficiency: Secondary | ICD-10-CM | POA: Diagnosis not present

## 2016-05-10 DIAGNOSIS — Z992 Dependence on renal dialysis: Secondary | ICD-10-CM | POA: Diagnosis not present

## 2016-05-10 DIAGNOSIS — N2581 Secondary hyperparathyroidism of renal origin: Secondary | ICD-10-CM | POA: Diagnosis not present

## 2016-05-10 DIAGNOSIS — T82898A Other specified complication of vascular prosthetic devices, implants and grafts, initial encounter: Secondary | ICD-10-CM | POA: Diagnosis not present

## 2016-05-10 DIAGNOSIS — E611 Iron deficiency: Secondary | ICD-10-CM | POA: Diagnosis not present

## 2016-05-10 DIAGNOSIS — N186 End stage renal disease: Secondary | ICD-10-CM | POA: Diagnosis not present

## 2016-05-12 DIAGNOSIS — Z992 Dependence on renal dialysis: Secondary | ICD-10-CM | POA: Diagnosis not present

## 2016-05-12 DIAGNOSIS — N2581 Secondary hyperparathyroidism of renal origin: Secondary | ICD-10-CM | POA: Diagnosis not present

## 2016-05-12 DIAGNOSIS — N186 End stage renal disease: Secondary | ICD-10-CM | POA: Diagnosis not present

## 2016-05-12 DIAGNOSIS — E611 Iron deficiency: Secondary | ICD-10-CM | POA: Diagnosis not present

## 2016-05-12 DIAGNOSIS — T82898A Other specified complication of vascular prosthetic devices, implants and grafts, initial encounter: Secondary | ICD-10-CM | POA: Diagnosis not present

## 2016-05-14 DIAGNOSIS — Z992 Dependence on renal dialysis: Secondary | ICD-10-CM | POA: Diagnosis not present

## 2016-05-14 DIAGNOSIS — N186 End stage renal disease: Secondary | ICD-10-CM | POA: Diagnosis not present

## 2016-05-14 DIAGNOSIS — N2581 Secondary hyperparathyroidism of renal origin: Secondary | ICD-10-CM | POA: Diagnosis not present

## 2016-05-14 DIAGNOSIS — T82898A Other specified complication of vascular prosthetic devices, implants and grafts, initial encounter: Secondary | ICD-10-CM | POA: Diagnosis not present

## 2016-05-14 DIAGNOSIS — E611 Iron deficiency: Secondary | ICD-10-CM | POA: Diagnosis not present

## 2016-05-17 DIAGNOSIS — T82898A Other specified complication of vascular prosthetic devices, implants and grafts, initial encounter: Secondary | ICD-10-CM | POA: Diagnosis not present

## 2016-05-17 DIAGNOSIS — N186 End stage renal disease: Secondary | ICD-10-CM | POA: Diagnosis not present

## 2016-05-17 DIAGNOSIS — N2581 Secondary hyperparathyroidism of renal origin: Secondary | ICD-10-CM | POA: Diagnosis not present

## 2016-05-17 DIAGNOSIS — Z992 Dependence on renal dialysis: Secondary | ICD-10-CM | POA: Diagnosis not present

## 2016-05-17 DIAGNOSIS — E611 Iron deficiency: Secondary | ICD-10-CM | POA: Diagnosis not present

## 2016-05-18 DIAGNOSIS — N186 End stage renal disease: Secondary | ICD-10-CM | POA: Diagnosis not present

## 2016-05-18 DIAGNOSIS — Z992 Dependence on renal dialysis: Secondary | ICD-10-CM | POA: Diagnosis not present

## 2016-05-19 DIAGNOSIS — N186 End stage renal disease: Secondary | ICD-10-CM | POA: Diagnosis not present

## 2016-05-19 DIAGNOSIS — N2581 Secondary hyperparathyroidism of renal origin: Secondary | ICD-10-CM | POA: Diagnosis not present

## 2016-05-19 DIAGNOSIS — E611 Iron deficiency: Secondary | ICD-10-CM | POA: Diagnosis not present

## 2016-05-19 DIAGNOSIS — Z992 Dependence on renal dialysis: Secondary | ICD-10-CM | POA: Diagnosis not present

## 2016-05-21 DIAGNOSIS — N186 End stage renal disease: Secondary | ICD-10-CM | POA: Diagnosis not present

## 2016-05-21 DIAGNOSIS — E611 Iron deficiency: Secondary | ICD-10-CM | POA: Diagnosis not present

## 2016-05-21 DIAGNOSIS — N2581 Secondary hyperparathyroidism of renal origin: Secondary | ICD-10-CM | POA: Diagnosis not present

## 2016-05-21 DIAGNOSIS — Z992 Dependence on renal dialysis: Secondary | ICD-10-CM | POA: Diagnosis not present

## 2016-05-24 DIAGNOSIS — Z992 Dependence on renal dialysis: Secondary | ICD-10-CM | POA: Diagnosis not present

## 2016-05-24 DIAGNOSIS — E611 Iron deficiency: Secondary | ICD-10-CM | POA: Diagnosis not present

## 2016-05-24 DIAGNOSIS — N2581 Secondary hyperparathyroidism of renal origin: Secondary | ICD-10-CM | POA: Diagnosis not present

## 2016-05-24 DIAGNOSIS — N186 End stage renal disease: Secondary | ICD-10-CM | POA: Diagnosis not present

## 2016-05-26 DIAGNOSIS — N186 End stage renal disease: Secondary | ICD-10-CM | POA: Diagnosis not present

## 2016-05-26 DIAGNOSIS — Z992 Dependence on renal dialysis: Secondary | ICD-10-CM | POA: Diagnosis not present

## 2016-05-26 DIAGNOSIS — N2581 Secondary hyperparathyroidism of renal origin: Secondary | ICD-10-CM | POA: Diagnosis not present

## 2016-05-26 DIAGNOSIS — E611 Iron deficiency: Secondary | ICD-10-CM | POA: Diagnosis not present

## 2016-05-28 DIAGNOSIS — Z992 Dependence on renal dialysis: Secondary | ICD-10-CM | POA: Diagnosis not present

## 2016-05-28 DIAGNOSIS — N186 End stage renal disease: Secondary | ICD-10-CM | POA: Diagnosis not present

## 2016-05-28 DIAGNOSIS — N2581 Secondary hyperparathyroidism of renal origin: Secondary | ICD-10-CM | POA: Diagnosis not present

## 2016-05-28 DIAGNOSIS — E611 Iron deficiency: Secondary | ICD-10-CM | POA: Diagnosis not present

## 2016-05-31 DIAGNOSIS — Z992 Dependence on renal dialysis: Secondary | ICD-10-CM | POA: Diagnosis not present

## 2016-05-31 DIAGNOSIS — N2581 Secondary hyperparathyroidism of renal origin: Secondary | ICD-10-CM | POA: Diagnosis not present

## 2016-05-31 DIAGNOSIS — N186 End stage renal disease: Secondary | ICD-10-CM | POA: Diagnosis not present

## 2016-05-31 DIAGNOSIS — E611 Iron deficiency: Secondary | ICD-10-CM | POA: Diagnosis not present

## 2016-06-02 DIAGNOSIS — E611 Iron deficiency: Secondary | ICD-10-CM | POA: Diagnosis not present

## 2016-06-02 DIAGNOSIS — N186 End stage renal disease: Secondary | ICD-10-CM | POA: Diagnosis not present

## 2016-06-02 DIAGNOSIS — Z992 Dependence on renal dialysis: Secondary | ICD-10-CM | POA: Diagnosis not present

## 2016-06-02 DIAGNOSIS — N2581 Secondary hyperparathyroidism of renal origin: Secondary | ICD-10-CM | POA: Diagnosis not present

## 2016-06-04 DIAGNOSIS — Z992 Dependence on renal dialysis: Secondary | ICD-10-CM | POA: Diagnosis not present

## 2016-06-04 DIAGNOSIS — N186 End stage renal disease: Secondary | ICD-10-CM | POA: Diagnosis not present

## 2016-06-04 DIAGNOSIS — N2581 Secondary hyperparathyroidism of renal origin: Secondary | ICD-10-CM | POA: Diagnosis not present

## 2016-06-04 DIAGNOSIS — E611 Iron deficiency: Secondary | ICD-10-CM | POA: Diagnosis not present

## 2016-06-07 DIAGNOSIS — Z992 Dependence on renal dialysis: Secondary | ICD-10-CM | POA: Diagnosis not present

## 2016-06-07 DIAGNOSIS — E611 Iron deficiency: Secondary | ICD-10-CM | POA: Diagnosis not present

## 2016-06-07 DIAGNOSIS — N186 End stage renal disease: Secondary | ICD-10-CM | POA: Diagnosis not present

## 2016-06-07 DIAGNOSIS — N2581 Secondary hyperparathyroidism of renal origin: Secondary | ICD-10-CM | POA: Diagnosis not present

## 2016-06-09 DIAGNOSIS — E611 Iron deficiency: Secondary | ICD-10-CM | POA: Diagnosis not present

## 2016-06-09 DIAGNOSIS — Z992 Dependence on renal dialysis: Secondary | ICD-10-CM | POA: Diagnosis not present

## 2016-06-09 DIAGNOSIS — N2581 Secondary hyperparathyroidism of renal origin: Secondary | ICD-10-CM | POA: Diagnosis not present

## 2016-06-09 DIAGNOSIS — N186 End stage renal disease: Secondary | ICD-10-CM | POA: Diagnosis not present

## 2016-06-11 DIAGNOSIS — Z992 Dependence on renal dialysis: Secondary | ICD-10-CM | POA: Diagnosis not present

## 2016-06-11 DIAGNOSIS — N186 End stage renal disease: Secondary | ICD-10-CM | POA: Diagnosis not present

## 2016-06-11 DIAGNOSIS — E611 Iron deficiency: Secondary | ICD-10-CM | POA: Diagnosis not present

## 2016-06-11 DIAGNOSIS — N2581 Secondary hyperparathyroidism of renal origin: Secondary | ICD-10-CM | POA: Diagnosis not present

## 2016-06-14 DIAGNOSIS — N2581 Secondary hyperparathyroidism of renal origin: Secondary | ICD-10-CM | POA: Diagnosis not present

## 2016-06-14 DIAGNOSIS — E611 Iron deficiency: Secondary | ICD-10-CM | POA: Diagnosis not present

## 2016-06-14 DIAGNOSIS — Z992 Dependence on renal dialysis: Secondary | ICD-10-CM | POA: Diagnosis not present

## 2016-06-14 DIAGNOSIS — N186 End stage renal disease: Secondary | ICD-10-CM | POA: Diagnosis not present

## 2016-06-15 DIAGNOSIS — Z992 Dependence on renal dialysis: Secondary | ICD-10-CM | POA: Diagnosis not present

## 2016-06-15 DIAGNOSIS — N186 End stage renal disease: Secondary | ICD-10-CM | POA: Diagnosis not present

## 2016-06-16 DIAGNOSIS — N2581 Secondary hyperparathyroidism of renal origin: Secondary | ICD-10-CM | POA: Diagnosis not present

## 2016-06-16 DIAGNOSIS — N186 End stage renal disease: Secondary | ICD-10-CM | POA: Diagnosis not present

## 2016-06-16 DIAGNOSIS — E611 Iron deficiency: Secondary | ICD-10-CM | POA: Diagnosis not present

## 2016-06-16 DIAGNOSIS — Z992 Dependence on renal dialysis: Secondary | ICD-10-CM | POA: Diagnosis not present

## 2016-06-18 DIAGNOSIS — Z992 Dependence on renal dialysis: Secondary | ICD-10-CM | POA: Diagnosis not present

## 2016-06-18 DIAGNOSIS — N2581 Secondary hyperparathyroidism of renal origin: Secondary | ICD-10-CM | POA: Diagnosis not present

## 2016-06-18 DIAGNOSIS — E611 Iron deficiency: Secondary | ICD-10-CM | POA: Diagnosis not present

## 2016-06-18 DIAGNOSIS — N186 End stage renal disease: Secondary | ICD-10-CM | POA: Diagnosis not present

## 2016-06-21 DIAGNOSIS — Z992 Dependence on renal dialysis: Secondary | ICD-10-CM | POA: Diagnosis not present

## 2016-06-21 DIAGNOSIS — N186 End stage renal disease: Secondary | ICD-10-CM | POA: Diagnosis not present

## 2016-06-21 DIAGNOSIS — N2581 Secondary hyperparathyroidism of renal origin: Secondary | ICD-10-CM | POA: Diagnosis not present

## 2016-06-21 DIAGNOSIS — E611 Iron deficiency: Secondary | ICD-10-CM | POA: Diagnosis not present

## 2016-06-22 ENCOUNTER — Other Ambulatory Visit (INDEPENDENT_AMBULATORY_CARE_PROVIDER_SITE_OTHER): Payer: Medicare Other

## 2016-06-22 ENCOUNTER — Ambulatory Visit (INDEPENDENT_AMBULATORY_CARE_PROVIDER_SITE_OTHER): Payer: Medicare Other | Admitting: Vascular Surgery

## 2016-06-22 ENCOUNTER — Other Ambulatory Visit (INDEPENDENT_AMBULATORY_CARE_PROVIDER_SITE_OTHER): Payer: Self-pay | Admitting: Vascular Surgery

## 2016-06-22 DIAGNOSIS — N186 End stage renal disease: Secondary | ICD-10-CM | POA: Diagnosis not present

## 2016-06-22 DIAGNOSIS — T829XXD Unspecified complication of cardiac and vascular prosthetic device, implant and graft, subsequent encounter: Secondary | ICD-10-CM | POA: Diagnosis not present

## 2016-06-23 DIAGNOSIS — N186 End stage renal disease: Secondary | ICD-10-CM | POA: Diagnosis not present

## 2016-06-23 DIAGNOSIS — Z992 Dependence on renal dialysis: Secondary | ICD-10-CM | POA: Diagnosis not present

## 2016-06-23 DIAGNOSIS — E611 Iron deficiency: Secondary | ICD-10-CM | POA: Diagnosis not present

## 2016-06-23 DIAGNOSIS — N2581 Secondary hyperparathyroidism of renal origin: Secondary | ICD-10-CM | POA: Diagnosis not present

## 2016-06-25 DIAGNOSIS — Z992 Dependence on renal dialysis: Secondary | ICD-10-CM | POA: Diagnosis not present

## 2016-06-25 DIAGNOSIS — E611 Iron deficiency: Secondary | ICD-10-CM | POA: Diagnosis not present

## 2016-06-25 DIAGNOSIS — N186 End stage renal disease: Secondary | ICD-10-CM | POA: Diagnosis not present

## 2016-06-25 DIAGNOSIS — N2581 Secondary hyperparathyroidism of renal origin: Secondary | ICD-10-CM | POA: Diagnosis not present

## 2016-06-28 DIAGNOSIS — N2581 Secondary hyperparathyroidism of renal origin: Secondary | ICD-10-CM | POA: Diagnosis not present

## 2016-06-28 DIAGNOSIS — N186 End stage renal disease: Secondary | ICD-10-CM | POA: Diagnosis not present

## 2016-06-28 DIAGNOSIS — E611 Iron deficiency: Secondary | ICD-10-CM | POA: Diagnosis not present

## 2016-06-28 DIAGNOSIS — Z992 Dependence on renal dialysis: Secondary | ICD-10-CM | POA: Diagnosis not present

## 2016-06-29 ENCOUNTER — Other Ambulatory Visit (INDEPENDENT_AMBULATORY_CARE_PROVIDER_SITE_OTHER): Payer: Self-pay | Admitting: Vascular Surgery

## 2016-06-30 ENCOUNTER — Ambulatory Visit
Admission: RE | Admit: 2016-06-30 | Discharge: 2016-06-30 | Disposition: A | Discharge status: Home / Self Care | Payer: Medicare Other | Source: Ambulatory Visit | Attending: Vascular Surgery | Admitting: Vascular Surgery

## 2016-06-30 ENCOUNTER — Encounter: Payer: Self-pay | Admitting: *Deleted

## 2016-06-30 ENCOUNTER — Encounter
Admission: RE | Disposition: A | Discharge status: Home / Self Care | Payer: Self-pay | Source: Ambulatory Visit | Attending: Vascular Surgery

## 2016-06-30 DIAGNOSIS — Z7982 Long term (current) use of aspirin: Secondary | ICD-10-CM | POA: Diagnosis not present

## 2016-06-30 DIAGNOSIS — N186 End stage renal disease: Secondary | ICD-10-CM | POA: Diagnosis not present

## 2016-06-30 DIAGNOSIS — Z6841 Body Mass Index (BMI) 40.0 and over, adult: Secondary | ICD-10-CM | POA: Insufficient documentation

## 2016-06-30 DIAGNOSIS — N2581 Secondary hyperparathyroidism of renal origin: Secondary | ICD-10-CM | POA: Diagnosis not present

## 2016-06-30 DIAGNOSIS — T82590A Other mechanical complication of surgically created arteriovenous fistula, initial encounter: Secondary | ICD-10-CM | POA: Diagnosis not present

## 2016-06-30 DIAGNOSIS — T82868A Thrombosis of vascular prosthetic devices, implants and grafts, initial encounter: Secondary | ICD-10-CM | POA: Diagnosis not present

## 2016-06-30 DIAGNOSIS — Z8249 Family history of ischemic heart disease and other diseases of the circulatory system: Secondary | ICD-10-CM | POA: Diagnosis not present

## 2016-06-30 DIAGNOSIS — I509 Heart failure, unspecified: Secondary | ICD-10-CM | POA: Diagnosis not present

## 2016-06-30 DIAGNOSIS — E611 Iron deficiency: Secondary | ICD-10-CM | POA: Diagnosis not present

## 2016-06-30 DIAGNOSIS — I132 Hypertensive heart and chronic kidney disease with heart failure and with stage 5 chronic kidney disease, or end stage renal disease: Secondary | ICD-10-CM | POA: Insufficient documentation

## 2016-06-30 DIAGNOSIS — I951 Orthostatic hypotension: Secondary | ICD-10-CM | POA: Diagnosis not present

## 2016-06-30 DIAGNOSIS — I429 Cardiomyopathy, unspecified: Secondary | ICD-10-CM | POA: Insufficient documentation

## 2016-06-30 DIAGNOSIS — Z992 Dependence on renal dialysis: Secondary | ICD-10-CM | POA: Insufficient documentation

## 2016-06-30 DIAGNOSIS — Z7902 Long term (current) use of antithrombotics/antiplatelets: Secondary | ICD-10-CM | POA: Diagnosis not present

## 2016-06-30 DIAGNOSIS — Y832 Surgical operation with anastomosis, bypass or graft as the cause of abnormal reaction of the patient, or of later complication, without mention of misadventure at the time of the procedure: Secondary | ICD-10-CM | POA: Insufficient documentation

## 2016-06-30 DIAGNOSIS — E663 Overweight: Secondary | ICD-10-CM | POA: Insufficient documentation

## 2016-06-30 DIAGNOSIS — I770 Arteriovenous fistula, acquired: Secondary | ICD-10-CM | POA: Insufficient documentation

## 2016-06-30 DIAGNOSIS — F17200 Nicotine dependence, unspecified, uncomplicated: Secondary | ICD-10-CM | POA: Diagnosis not present

## 2016-06-30 DIAGNOSIS — Z9889 Other specified postprocedural states: Secondary | ICD-10-CM | POA: Insufficient documentation

## 2016-06-30 HISTORY — PX: A/V FISTULAGRAM: CATH118298

## 2016-06-30 LAB — POTASSIUM (ARMC VASCULAR LAB ONLY): POTASSIUM (ARMC VASCULAR LAB): 5.2 — AB (ref 3.5–5.1)

## 2016-06-30 SURGERY — A/V FISTULAGRAM
Anesthesia: Moderate Sedation | Laterality: Left

## 2016-06-30 MED ORDER — HEPARIN SODIUM (PORCINE) 1000 UNIT/ML IJ SOLN
INTRAMUSCULAR | Status: AC
  Filled 2016-06-30: qty 1

## 2016-06-30 MED ORDER — GUAIFENESIN-DM 100-10 MG/5ML PO SYRP
15.0000 mL | ORAL_SOLUTION | ORAL | Status: DC | PRN
  Filled 2016-06-30: qty 15

## 2016-06-30 MED ORDER — METHYLPREDNISOLONE SODIUM SUCC 125 MG IJ SOLR: 125.0000 mg | INTRAMUSCULAR | Status: DC | PRN

## 2016-06-30 MED ORDER — OXYCODONE-ACETAMINOPHEN 5-325 MG PO TABS
1.0000 | ORAL_TABLET | ORAL | Status: DC | PRN
Start: 2016-06-30 — End: 2016-06-30

## 2016-06-30 MED ORDER — SODIUM CHLORIDE FLUSH 0.9 % IV SOLN
INTRAVENOUS | Status: AC
  Filled 2016-06-30: qty 20

## 2016-06-30 MED ORDER — MORPHINE SULFATE (PF) 4 MG/ML IV SOLN: 2.0000 mg | INTRAVENOUS | Status: DC | PRN

## 2016-06-30 MED ORDER — FENTANYL CITRATE (PF) 100 MCG/2ML IJ SOLN
INTRAMUSCULAR | Status: DC | PRN
  Administered 2016-06-30: 50 ug via INTRAVENOUS

## 2016-06-30 MED ORDER — SODIUM CHLORIDE 0.9 % IV SOLN: 500.0000 mL | Freq: Once | INTRAVENOUS | Status: DC | PRN

## 2016-06-30 MED ORDER — ALUM & MAG HYDROXIDE-SIMETH 200-200-20 MG/5ML PO SUSP: 15.0000 mL | ORAL | Status: DC | PRN

## 2016-06-30 MED ORDER — CEFAZOLIN IN D5W 1 GM/50ML IV SOLN
1.0000 g | Freq: Once | INTRAVENOUS | Status: AC
  Administered 2016-06-30: 1 g via INTRAVENOUS

## 2016-06-30 MED ORDER — LIDOCAINE-EPINEPHRINE (PF) 2 %-1:200000 IJ SOLN
INTRAMUSCULAR | Status: AC
  Filled 2016-06-30: qty 20

## 2016-06-30 MED ORDER — HEPARIN (PORCINE) IN NACL 2-0.9 UNIT/ML-% IJ SOLN
INTRAMUSCULAR | Status: AC
  Filled 2016-06-30: qty 1000

## 2016-06-30 MED ORDER — ACETAMINOPHEN 325 MG RE SUPP
325.0000 mg | RECTAL | Status: DC | PRN
  Filled 2016-06-30: qty 2

## 2016-06-30 MED ORDER — SODIUM CHLORIDE 0.9 % IV SOLN
Freq: Once | INTRAVENOUS | Status: DC
  Filled 2016-06-30: qty 98

## 2016-06-30 MED ORDER — HEPARIN SODIUM (PORCINE) 1000 UNIT/ML IJ SOLN
INTRAMUSCULAR | Status: DC | PRN
Start: 2016-06-30 — End: 2016-06-30
  Administered 2016-06-30: 3000 [IU] via INTRAVENOUS

## 2016-06-30 MED ORDER — MIDAZOLAM HCL 5 MG/5ML IJ SOLN
INTRAMUSCULAR | Status: AC
  Filled 2016-06-30: qty 5

## 2016-06-30 MED ORDER — ONDANSETRON HCL 4 MG/2ML IJ SOLN: 4.0000 mg | Freq: Four times a day (QID) | INTRAMUSCULAR | Status: DC | PRN

## 2016-06-30 MED ORDER — FENTANYL CITRATE (PF) 100 MCG/2ML IJ SOLN
INTRAMUSCULAR | Status: AC
  Filled 2016-06-30: qty 2

## 2016-06-30 MED ORDER — HEPARIN SODIUM (PORCINE) 5000 UNIT/ML IJ SOLN
INTRAMUSCULAR | Status: AC
  Filled 2016-06-30: qty 2

## 2016-06-30 MED ORDER — MIDAZOLAM HCL 2 MG/2ML IJ SOLN
INTRAMUSCULAR | Status: DC | PRN
Start: 2016-06-30 — End: 2016-06-30
  Administered 2016-06-30: 2 mg via INTRAVENOUS

## 2016-06-30 MED ORDER — LABETALOL HCL 5 MG/ML IV SOLN: 10.0000 mg | INTRAVENOUS | Status: DC | PRN

## 2016-06-30 MED ORDER — METOPROLOL TARTRATE 5 MG/5ML IV SOLN: 2.0000 mg | INTRAVENOUS | Status: DC | PRN

## 2016-06-30 MED ORDER — SODIUM CHLORIDE 0.9 % IV SOLN: INTRAVENOUS | Status: DC

## 2016-06-30 MED ORDER — HYDROMORPHONE HCL 1 MG/ML IJ SOLN: 1.0000 mg | Freq: Once | INTRAMUSCULAR | Status: DC

## 2016-06-30 MED ORDER — ACETAMINOPHEN 325 MG PO TABS
325.0000 mg | ORAL_TABLET | ORAL | Status: DC | PRN
Start: 2016-06-30 — End: 2016-06-30

## 2016-06-30 MED ORDER — IOPAMIDOL (ISOVUE-300) INJECTION 61%
INTRAVENOUS | Status: DC | PRN
  Administered 2016-06-30: 25 mL via INTRA_ARTERIAL

## 2016-06-30 MED ORDER — SODIUM CHLORIDE 0.9 % IV SOLN
5.0000 mg | Freq: Once | INTRAVENOUS | Status: DC
  Filled 2016-06-30: qty 5

## 2016-06-30 MED ORDER — HYDRALAZINE HCL 20 MG/ML IJ SOLN: 5.0000 mg | INTRAMUSCULAR | Status: DC | PRN

## 2016-06-30 MED ORDER — FAMOTIDINE 20 MG PO TABS: 40.0000 mg | ORAL_TABLET | ORAL | Status: DC | PRN

## 2016-06-30 MED ORDER — SODIUM CHLORIDE 0.9 % IV SOLN
10.0000 mg | Freq: Once | INTRAVENOUS | Status: DC
  Filled 2016-06-30: qty 10

## 2016-06-30 MED ORDER — PHENOL 1.4 % MT LIQD
1.0000 | OROMUCOSAL | Status: DC | PRN
  Filled 2016-06-30: qty 177

## 2016-06-30 SURGICAL SUPPLY — 7 items
BALLN DORADO 8X100X80 (BALLOONS) ×3
BALLOON DORADO 8X100X80 (BALLOONS) ×1 IMPLANT
CANNULA 5F STIFF (CANNULA) ×3 IMPLANT
DEVICE PRESTO INFLATION (MISCELLANEOUS) ×3 IMPLANT
PACK ANGIOGRAPHY (CUSTOM PROCEDURE TRAY) ×3 IMPLANT
SHEATH BRITE TIP 6FRX5.5 (SHEATH) ×3 IMPLANT
WIRE MAGIC TOR.035 180C (WIRE) ×3 IMPLANT

## 2016-06-30 NOTE — H&P (Signed)
[facility] History & Physical Update  The patient was interviewed and re-examined.  The patient's previous History and Physical has been reviewed and is unchanged.  There is no change in the plan of care. We plan to proceed with the scheduled procedure.  Leotis Pain, MD  06/30/2016, 10:52 AM

## 2016-06-30 NOTE — Op Note (Signed)
[facility]    OPERATIVE NOTE   PROCEDURE: 1.   Left radiocephalic arteriovenous fistula cannulation under ultrasound guidance 2.   Left arm fistulagram including central venogram 3.   Percutaneous transluminal angioplasty of the forearm cephalic vein at its junction to the deep venous system near the antecubital fossa with 100m diameter high pressure angioplasty balloon  PRE-OPERATIVE DIAGNOSIS: 1. ESRD 2. Poorly functional left radiocephalic AVF  POST-OPERATIVE DIAGNOSIS: same as above   SURGEON: JLeotis Pain MD  ANESTHESIA: local with MCS  ESTIMATED BLOOD LOSS: Minimal  FINDING(S): 1. What appeared to be moderate narrowing in the 60-70% range in the junction of the cephalic vein to the deep venous system with an otherwise patent forearm cephalic vein although the fistula was reasonably small in size. 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 The anastomosis was widely patent.  SPECIMEN(S):  None  CONTRAST: 25 cc  FLUORO TIME: 1.2 minutes  MODERATE CONSCIOUS SEDATION TIME: Approximately 20 minutes with 2 mg of Versed and 50 mcg of Fentanyl   INDICATIONS: [name] BERNASCONIis a 51y.o. male who presents with malfunctioning not yet usable left radiocephalic arteriovenous fistula.  The patient is scheduled for left arm fistulagram.  The patient is aware the risks include but are not limited to: bleeding, infection, thrombosis of the cannulated access, and possible anaphylactic reaction to the contrast.  The patient is aware of the risks of the procedure and elects to proceed forward.  DESCRIPTION: After full informed written consent was obtained, the patient was brought back to the angiography suite and placed supine upon the angiography table.  The patient was connected to monitoring equipment. Moderate conscious sedation was administered with a face  to face encounter with the patient throughout the procedure with my supervision of the RN administering medicines and monitoring the patient's vital signs and mental status throughout from the start of the procedure until the patient was taken to the recovery room. The left arm was prepped and draped in the standard fashion for a percutaneous access intervention.  Under ultrasound guidance, the left radiocephalic arteriovenous fistula was cannulated with a micropuncture needle under direct ultrasound guidance and a permanent image was performed.  The microwire was advanced into the fistula and the needle was exchanged for the a microsheath.  I then upsized to a 6 Fr Sheath and imaging was performed.  Hand injections were completed to image the access including the central venous system. This demonstrated what appeared to be moderate narrowing in the 60-70% range in the junction of the cephalic vein to the deep venous system with an otherwise patent forearm cephalic vein although the fistula was reasonably small in size. 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 The anastomosis was widely patent..  Based on the images, plan to treat the narrowing between the cephalic vein in the deep venous system as well as what would essentially be balloon assisted maturation for the remainder of the accessible forearm cephalic vein. I then gave the patient 3000 units of intravenous heparin.  I then crossed the stenosis with a Magic Tourqe wire.  Based on the imaging, a 8 mm x 10 cm  high pressure angioplasty balloon was selected.  The balloon was centered around the cephalic vein entrance into the deep venous  system stenosis near the antecubital fossa and inflated to 14 ATM for 1 minute(s).  A higher pressure was necessary as there was a tight waist that did not break until 14 atm. I then pulled back the balloon  into the mid forearm cephalic vein and inflated again to 14 atm for 1 minute for what was essentially balloon assisted maturation of the forearm cephalic vein. On completion imaging, a less than 10 % residual stenosis was present and the vein appeared enlarged from prior to treatment.  It should be usable for dialysis next week.   Based on the completion imaging, no further intervention is necessary.  The wire and balloon were removed from the sheath.  A 4-0 Monocryl purse-string suture was sewn around the sheath.  The sheath was removed while tying down the suture.  A sterile bandage was applied to the puncture site.  COMPLICATIONS: None  CONDITION: Stable   Leotis Pain  06/30/2016 11:59 AM   This note was created with Dragon Medical transcription system. Any errors in dictation are purely unintentional.

## 2016-06-30 NOTE — H&P (Signed)
[facility] History & Physical Update  The patient was interviewed and re-examined.  The patient's previous History and Physical has been reviewed and is unchanged.  There is no change in the plan of care. We plan to proceed with the scheduled procedure.  Leotis Pain, MD  06/30/2016, 9:10 AM

## 2016-07-01 ENCOUNTER — Encounter: Payer: Self-pay | Admitting: Vascular Surgery

## 2016-07-02 DIAGNOSIS — N186 End stage renal disease: Secondary | ICD-10-CM | POA: Diagnosis not present

## 2016-07-02 DIAGNOSIS — Z992 Dependence on renal dialysis: Secondary | ICD-10-CM | POA: Diagnosis not present

## 2016-07-02 DIAGNOSIS — E611 Iron deficiency: Secondary | ICD-10-CM | POA: Diagnosis not present

## 2016-07-02 DIAGNOSIS — N2581 Secondary hyperparathyroidism of renal origin: Secondary | ICD-10-CM | POA: Diagnosis not present

## 2016-07-05 DIAGNOSIS — E611 Iron deficiency: Secondary | ICD-10-CM | POA: Diagnosis not present

## 2016-07-05 DIAGNOSIS — N2581 Secondary hyperparathyroidism of renal origin: Secondary | ICD-10-CM | POA: Diagnosis not present

## 2016-07-05 DIAGNOSIS — N186 End stage renal disease: Secondary | ICD-10-CM | POA: Diagnosis not present

## 2016-07-05 DIAGNOSIS — Z992 Dependence on renal dialysis: Secondary | ICD-10-CM | POA: Diagnosis not present

## 2016-07-06 NOTE — H&P (Signed)
[facility] Admission History & Physical  MRN : [id number]  [name] [name] is a 51 y.o. (1965/04/20) male who presents with chief complaint of No chief complaint on file. [name]  History of Present Illness: I am asked to evaluate the patient by the dialysis center. The patient was sent here because his AVF has not matured and his catheter is working poorly.  The patient is unaware of any other change.  Patient denies pain or tenderness overlying the access.  There is no pain with dialysis, but he is using the catheter currently.  The patient denies hand pain or finger pain consistent with steal syndrome.   There have been past interventions of this access.  The patient is chronically hypotensive on dialysis.  No current facility-administered medications for this encounter.    Current Outpatient Prescriptions  Medication Sig Dispense Refill  . apixaban (ELIQUIS) 5 MG TABS tablet Take 5 mg by mouth 2 (two) times daily.    [name] aspirin EC 81 MG tablet Take 81 mg by mouth every morning.    . B Complex-C-Folic Acid (NEPHRO-VITE PO) Take 1 tablet by mouth daily.      . calcium acetate (PHOSLO) 667 MG capsule Take 2,001 mg by mouth See admin instructions. 1334 mg 3 times a day with meals and 667 mg with snacks    . cinacalcet (SENSIPAR) 90 MG tablet Take 90 mg by mouth daily.    [name] FOSRENOL 500 MG chewable tablet Chew 500 mg by mouth 3 (three) times daily with meals. Reported on 11/04/2015    . midodrine (PROAMATINE) 5 MG tablet Take 1 tablet by mouth See admin instructions. Pt may take before dialysis treatment, Tues, Thurs and Sat  4  . nitroGLYCERIN (NITROSTAT) 0.4 MG SL tablet Place 1 tablet (0.4 mg total) under the tongue every 5 (five) minutes as needed. For chest 45 tablet 3    Past Medical History:  Diagnosis Date  . Aneurysm ([place])     Right arm fistula 3 aneurysms 2011,   plans to have a new procedure  . Angina    occasional, last 6 mo ago  . Chest discomfort      2008  . CHF (congestive heart failure) ([place])   . Dialysis patient [facility])    M-W-F @ [place]  . Dysrhythmia   . Ejection fraction     65%, echo, 2008   /   EF 55-60%, echo, October 28, 2010  . ESRD (end stage renal disease) ([place])    on hemodialysis T_T_S  . Hypertension   . Leg pain   . Orthostatic hypotension     2008  . Overweight(278.02)   . Sinus tachycardia     2008, TSH normal ... happens frequently with heart rate at 120-130  . Syncope     positional after dialysis... 2008    Past Surgical History:  Procedure Laterality Date  . A/V SHUNTOGRAM Left 06/30/2016   Procedure: A/V Fistulagram;  Surgeon: Algernon Huxley, MD;  Location: [facility];  Service: Cardiovascular;  Laterality: Left;  . ARTERIOVENOUS GRAFT PLACEMENT    . AV FISTULA PLACEMENT    . AV FISTULA PLACEMENT Left 11/18/2015   Procedure: ARTERIOVENOUS (AV) FISTULA CREATION ( RADIOCEPHALIC );  Surgeon: Algernon Huxley, MD;  Location: [facility];  Service: Vascular;  Laterality: Left;  . AV FISTULA PLACEMENT Left 03/23/2016   Procedure: ARTERIOVENOUS (AV) FISTULA CREATION ( REVISION );  Surgeon: Algernon Huxley, MD;  Location:  [facility];  Service: Vascular;  Laterality: Left;  . [place] REMOVAL Left 03/23/2016   Procedure: REMOVAL OF ARTERIOVENOUS GORETEX GRAFT ([place]);  Surgeon: Algernon Huxley, MD;  Location: [facility];  Service: Vascular;  Laterality: Left;  . HERNIA REPAIR    . INSERTION OF DIALYSIS CATHETER  03/01/2011   Procedure: INSERTION OF DIALYSIS CATHETER;  Surgeon: Elam Dutch, MD;  Location: [facility];  Service: Vascular;  Laterality: Left;  Exchange of Dialysis Catheter to 27cm 15Fr. Arrow Catheter  . PERIPHERAL VASCULAR CATHETERIZATION N/A 09/01/2014   Procedure: A/V Shuntogram/Fistulagram;  Surgeon: Algernon Huxley, MD;  Location: [facility];  Service: Cardiovascular;  Laterality: N/A;  . PERIPHERAL VASCULAR CATHETERIZATION Right 09/01/2014   Procedure: Thrombectomy;  Surgeon: Algernon Huxley, MD;  Location: [place] [facility];  Service: Cardiovascular;  Laterality: Right;  . PERIPHERAL VASCULAR CATHETERIZATION Right 09/01/2014   Procedure: A/V Shunt Intervention;  Surgeon: Algernon Huxley, MD;  Location: [place] [facility];  Service: Cardiovascular;  Laterality: Right;  . PERIPHERAL VASCULAR CATHETERIZATION N/A 09/17/2014   Procedure: A/V Shuntogram/Fistulagram;  Surgeon: Algernon Huxley, MD;  Location: [place] [facility];  Service: Cardiovascular;  Laterality: N/A;  . PERIPHERAL VASCULAR CATHETERIZATION Right 10/17/2014   Procedure: Thrombectomy;  Surgeon: Katha Cabal, MD;  Location: [place] [facility] [facility];  Service: Cardiovascular;  Laterality: Right;  . PERIPHERAL VASCULAR CATHETERIZATION N/A 10/17/2014   Procedure: A/V Shuntogram/Fistulagram;  Surgeon: Katha Cabal, MD;  Location: [place] [facility];  Service: Cardiovascular;  Laterality: N/A;  . PERIPHERAL VASCULAR CATHETERIZATION N/A 10/17/2014   Procedure: A/V Shunt Intervention;  Surgeon: Katha Cabal, MD;  Location: [facility];  Service: Cardiovascular;  Laterality: N/A;  . PERIPHERAL VASCULAR CATHETERIZATION Right 11/04/2014   Procedure: Thrombectomy;  Surgeon: Katha Cabal, MD;  Location: [place] [facility];  Service: Cardiovascular;  Laterality: Right;  . PERIPHERAL VASCULAR CATHETERIZATION Left 11/04/2014   Procedure: Visceral Venography;  Surgeon: Katha Cabal, MD;  Location: [place] [facility];  Service: Cardiovascular;  Laterality: Left;  . PERIPHERAL VASCULAR CATHETERIZATION Right 11/27/2014   Procedure: A/V Shuntogram/Fistulagram;  Surgeon: Algernon Huxley, MD;  Location: [place];  Service: Cardiovascular;  Laterality: Right;  . PERIPHERAL VASCULAR CATHETERIZATION Right 11/27/2014   Procedure: A/V Shunt Intervention;  Surgeon: Algernon Huxley, MD;  Location: [place] [facility];  Service: Cardiovascular;  Laterality: Right;  . PERIPHERAL VASCULAR CATHETERIZATION Right 12/31/2014   Procedure: Thrombectomy;   Surgeon: Algernon Huxley, MD;  Location: [place] [facility];  Service: Cardiovascular;  Laterality: Right;  . PERIPHERAL VASCULAR CATHETERIZATION Right 01/12/2015   Procedure: A/V Shuntogram/Fistulagram;  Surgeon: Algernon Huxley, MD;  Location: [place] [facility];  Service: Cardiovascular;  Laterality: Right;  . PERIPHERAL VASCULAR CATHETERIZATION N/A 01/12/2015   Procedure: A/V Shunt Intervention;  Surgeon: Algernon Huxley, MD;  Location: [place];  Service: Cardiovascular;  Laterality: N/A;  . PERIPHERAL VASCULAR CATHETERIZATION Right 01/28/2015   Procedure: Thrombectomy;  Surgeon: Algernon Huxley, MD;  Location: [place];  Service: Cardiovascular;  Laterality: Right;  . PERIPHERAL VASCULAR CATHETERIZATION N/A 06/08/2015   Procedure: A/V Shuntogram/Fistulagram;  Surgeon: Algernon Huxley, MD;  Location: [place] [facility];  Service: Cardiovascular;  Laterality: N/A;  . PERIPHERAL VASCULAR CATHETERIZATION N/A 06/08/2015   Procedure: A/V Shunt Intervention;  Surgeon: Algernon Huxley, MD;  Location: [place] [facility];  Service: Cardiovascular;  Laterality: N/A;  .  PERIPHERAL VASCULAR CATHETERIZATION Right 07/06/2015   Procedure: A/V Shuntogram/Fistulagram;  Surgeon: Algernon Huxley, MD;  Location: [place] [facility];  Service: Cardiovascular;  Laterality: Right;  . PERIPHERAL VASCULAR CATHETERIZATION N/A 07/06/2015   Procedure: A/V Shunt Intervention;  Surgeon: Algernon Huxley, MD;  Location: [facility];  Service: Cardiovascular;  Laterality: N/A;  . PERIPHERAL VASCULAR CATHETERIZATION N/A 08/04/2015   Procedure: graft declot;  Surgeon: Katha Cabal, MD;  Location: [place] [facility];  Service: Cardiovascular;  Laterality: N/A;  . PERIPHERAL VASCULAR CATHETERIZATION Right 08/25/2015   Procedure: A/V Shuntogram/Fistulagram;  Surgeon: Katha Cabal, MD;  Location: [place] [facility];  Service: Cardiovascular;  Laterality: Right;  . PERIPHERAL VASCULAR CATHETERIZATION N/A 11/04/2015    Procedure: Dialysis/Perma Catheter Insertion;  Surgeon: Algernon Huxley, MD;  Location: [facility];  Service: Cardiovascular;  Laterality: N/A;  . PERIPHERAL VASCULAR CATHETERIZATION Left 12/14/2015   Procedure: A/V Shuntogram/Fistulagram;  Surgeon: Algernon Huxley, MD;  Location: [place] [facility];  Service: Cardiovascular;  Laterality: Left;  . PERIPHERAL VASCULAR CATHETERIZATION N/A 12/14/2015   Procedure: A/V Shunt Intervention;  Surgeon: Algernon Huxley, MD;  Location: [place] [facility];  Service: Cardiovascular;  Laterality: N/A;  . PERIPHERAL VASCULAR CATHETERIZATION N/A 12/17/2015   Procedure: Dialysis/Perma Catheter Insertion;  Surgeon: Algernon Huxley, MD;  Location: [place] [facility];  Service: Cardiovascular;  Laterality: N/A;  . PERIPHERAL VASCULAR CATHETERIZATION Left 12/22/2015   Procedure: Dialysis/Perma Catheter Insertion;  Surgeon: Katha Cabal, MD;  Location: [facility];  Service: Cardiovascular;  Laterality: Left;  . PERIPHERAL VASCULAR CATHETERIZATION Left 02/29/2016   Procedure: A/V Shuntogram/Fistulagram;  Surgeon: Algernon Huxley, MD;  Location: [facility] [facility];  Service: Cardiovascular;  Laterality: Left;  . PERIPHERAL VASCULAR CATHETERIZATION N/A 02/29/2016   Procedure: A/V Shunt Intervention;  Surgeon: Algernon Huxley, MD;  Location: [facility] [facility];  Service: Cardiovascular;  Laterality: N/A;  . right arm graft     for dyalisis  . THROMBECTOMY    . THROMBECTOMY W/ EMBOLECTOMY  03/01/2011   Procedure: THROMBECTOMY ARTERIOVENOUS GORE-TEX GRAFT;  Surgeon: Elam Dutch, MD;  Location: [facility];  Service: Vascular;  Laterality: Right;  Attempted Thrombectomy of Old  Right Upper Arm Arteriovenous gortex Graft. Insertion of new Arteriovenous Graft using 39mm x 50cm Gortex Stretch graft.   . THROMBECTOMY W/ EMBOLECTOMY  07/11/2011   Procedure: THROMBECTOMY ARTERIOVENOUS GORE-TEX GRAFT;  Surgeon: Rosetta Posner, MD;  Location: [place];  Service: Vascular;  Laterality:  Right;  . UMBILICAL HERNIA REPAIR    . VENOGRAM N/A 08/23/2011   Procedure: VENOGRAM;  Surgeon: [name], MD;  Location: [facility];  Service: Cardiovascular;  Laterality: N/A;    Social History Social History  Substance Use Topics  . Smoking status: Former Smoker    Types: Cigarettes    Quit date: 04/18/1994  . Smokeless tobacco: Current User    Types: Snuff     Comment: dips 1/2 snuff per day times 25 years  . Alcohol use No    Family History Family History  Problem Relation Age of Onset  . Hypertension Mother   . Heart disease Mother   . Hypertension Father   . Heart disease Father     No family history of bleeding or clotting disorders, autoimmune disease or porphyria  No Known Allergies   REVIEW OF SYSTEMS (Negative unless checked)  Constitutional: [] Weight loss  [] Fever  [] Chills Cardiac: [] Chest pain   []   Chest pressure   [] Palpitations   [] Shortness of breath when laying flat   [] Shortness of breath at rest   [x] Shortness of breath with exertion. Vascular:  [] Pain in legs with walking   [] Pain in legs at rest   [] Pain in legs when laying flat   [] Claudication   [] Pain in feet when walking  [] Pain in feet at rest  [] Pain in feet when laying flat   [] History of DVT   [] Phlebitis   [] Swelling in legs   [] Varicose veins   [] Non-healing ulcers Pulmonary:   [] Uses home oxygen   [] Productive cough   [] Hemoptysis   [] Wheeze  [] COPD   [] Asthma Neurologic:  [] Dizziness  [] Blackouts   [] Seizures   [] History of stroke   [] History of TIA  [] Aphasia   [] Temporary blindness   [] Dysphagia   [] Weakness or numbness in arms   [] Weakness or numbness in legs Musculoskeletal:  [] Arthritis   [] Joint swelling   [] Joint pain   [] Low back pain Hematologic:  [] Easy bruising  [] Easy bleeding   [] Hypercoagulable state   [] Anemic  [] Hepatitis Gastrointestinal:  [] Blood in stool   [] Vomiting blood  [] Gastroesophageal reflux/heartburn   [] Difficulty swallowing. Genitourinary:  [x] Chronic kidney  disease   [] Difficult urination  [] Frequent urination  [] Burning with urination   [] Blood in urine Skin:  [] Rashes   [] Ulcers   [] Wounds Psychological:  [] History of anxiety   []  History of major depression.  Physical Examination  Vitals:   06/30/16 1155 06/30/16 1205 06/30/16 1215 06/30/16 1230  BP:  113/72 102/60 102/67  Pulse:   81 86  Resp:  19 (!) 21 18  Temp:      TempSrc:      SpO2: 98%  96% 95%  Weight:      Height:       Body mass index is 45.98 kg/m. Gen: WD/WN, NAD. Obese  Head: [place]/AT, No temporalis wasting. Prominent temp pulse not noted. Ear/Nose/Throat: Hearing grossly intact, nares w/o erythema or drainage, oropharynx w/o Erythema/Exudate,  Eyes: Conjunctiva clear, sclera non-icteric Neck: Trachea midline.  No JVD.  Pulmonary:  Good air movement, respirations not labored, no use of accessory muscles.  Cardiac: RRR, normal S1, S2. Vascular: weak thrill present in left arm AVF Vessel Right Left  Radial Palpable Palpable  Ulnar Not Palpable Not Palpable  Brachial Palpable Palpable  Carotid Palpable, without bruit Palpable, without bruit  Gastrointestinal: soft, non-tender/non-distended. No guarding/reflex.  Musculoskeletal: M/S 5/5 throughout.  Extremities without ischemic changes.  No deformity or atrophy.  Neurologic: Sensation grossly intact in extremities.  Symmetrical.  Speech is fluent. Motor exam as listed above. Psychiatric: Judgment intact, Mood & affect appropriate for pt's clinical situation. Dermatologic: No rashes or ulcers noted.  No cellulitis or open wounds. Lymph : No Cervical, Axillary, or Inguinal lymphadenopathy.   CBC Lab Results  Component Value Date   WBC 8.7 03/16/2016   HGB 16.3 03/23/2016   HCT 48.0 03/23/2016   MCV 77.1 (L) 03/16/2016   PLT 286 03/16/2016    BMET    Component Value Date/Time   NA 139 03/23/2016 1011   NA 137 11/13/2013 0408   K 4.0 03/23/2016 1011   K 4.1 11/13/2013 0408   CL 99 (L) 03/16/2016 0948   CL  98 11/13/2013 0408   CO2 28 03/16/2016 0948   CO2 25 11/13/2013 0408   GLUCOSE 80 03/23/2016 1011   GLUCOSE 64 (L) 11/13/2013 0408   BUN 34 (H) 03/16/2016 0948   BUN 68 (H)  11/13/2013 0408   CREATININE 11.38 (H) 03/16/2016 0948   CREATININE 16.45 (H) 11/13/2013 0408   CALCIUM 8.6 (L) 03/16/2016 0948   CALCIUM 6.8 (LL) 11/13/2013 0408   GFRNONAA 5 (L) 03/16/2016 0948   GFRNONAA 3 (L) 11/13/2013 0408   GFRAA 5 (L) 03/16/2016 0948   GFRAA 3 (L) 11/13/2013 0408   CrCl cannot be calculated (Patient's most recent lab result is older than the maximum 21 days allowed.).  COAG Lab Results  Component Value Date   INR 1.26 03/16/2016   INR 1.25 11/13/2015   INR 1.05 08/08/2012    Radiology No results found.  Assessment/Plan 1.  Complication dialysis device with thrombosis AV access:  Patient's left arm dialysis access is malfunctioning. The patient will undergo angiography and correction of any problems using interventional techniques with the hope of restoring function to the access.  The risks and benefits were described to the patient.  All questions were answered.  The patient agrees to proceed with angiography and intervention. Potassium will be drawn to ensure that it is an appropriate level prior to performing intervention. 2.  End-stage renal disease requiring hemodialysis:  Patient will continue dialysis therapy without further interruption if a successful intervention is not achieved then a tunneled catheter will be placed. Dialysis has already been arranged. 3.  Hypertension:  Patient will continue medical management; nephrology is following no changes in oral medications.      Leotis Pain, MD  07/06/2016 2:53 PM

## 2016-07-07 DIAGNOSIS — N2581 Secondary hyperparathyroidism of renal origin: Secondary | ICD-10-CM | POA: Diagnosis not present

## 2016-07-07 DIAGNOSIS — N186 End stage renal disease: Secondary | ICD-10-CM | POA: Diagnosis not present

## 2016-07-07 DIAGNOSIS — Z992 Dependence on renal dialysis: Secondary | ICD-10-CM | POA: Diagnosis not present

## 2016-07-07 DIAGNOSIS — E611 Iron deficiency: Secondary | ICD-10-CM | POA: Diagnosis not present

## 2016-07-09 DIAGNOSIS — N2581 Secondary hyperparathyroidism of renal origin: Secondary | ICD-10-CM | POA: Diagnosis not present

## 2016-07-09 DIAGNOSIS — E611 Iron deficiency: Secondary | ICD-10-CM | POA: Diagnosis not present

## 2016-07-09 DIAGNOSIS — Z992 Dependence on renal dialysis: Secondary | ICD-10-CM | POA: Diagnosis not present

## 2016-07-09 DIAGNOSIS — N186 End stage renal disease: Secondary | ICD-10-CM | POA: Diagnosis not present

## 2016-07-11 ENCOUNTER — Ambulatory Visit (INDEPENDENT_AMBULATORY_CARE_PROVIDER_SITE_OTHER): Payer: Self-pay | Admitting: Vascular Surgery

## 2016-07-11 ENCOUNTER — Encounter (INDEPENDENT_AMBULATORY_CARE_PROVIDER_SITE_OTHER): Payer: Self-pay

## 2016-07-12 DIAGNOSIS — Z992 Dependence on renal dialysis: Secondary | ICD-10-CM | POA: Diagnosis not present

## 2016-07-12 DIAGNOSIS — N2581 Secondary hyperparathyroidism of renal origin: Secondary | ICD-10-CM | POA: Diagnosis not present

## 2016-07-12 DIAGNOSIS — E611 Iron deficiency: Secondary | ICD-10-CM | POA: Diagnosis not present

## 2016-07-12 DIAGNOSIS — N186 End stage renal disease: Secondary | ICD-10-CM | POA: Diagnosis not present

## 2016-07-14 DIAGNOSIS — N186 End stage renal disease: Secondary | ICD-10-CM | POA: Diagnosis not present

## 2016-07-14 DIAGNOSIS — E611 Iron deficiency: Secondary | ICD-10-CM | POA: Diagnosis not present

## 2016-07-14 DIAGNOSIS — N2581 Secondary hyperparathyroidism of renal origin: Secondary | ICD-10-CM | POA: Diagnosis not present

## 2016-07-14 DIAGNOSIS — Z992 Dependence on renal dialysis: Secondary | ICD-10-CM | POA: Diagnosis not present

## 2016-07-16 DIAGNOSIS — N186 End stage renal disease: Secondary | ICD-10-CM | POA: Diagnosis not present

## 2016-07-16 DIAGNOSIS — N2581 Secondary hyperparathyroidism of renal origin: Secondary | ICD-10-CM | POA: Diagnosis not present

## 2016-07-16 DIAGNOSIS — E611 Iron deficiency: Secondary | ICD-10-CM | POA: Diagnosis not present

## 2016-07-16 DIAGNOSIS — Z992 Dependence on renal dialysis: Secondary | ICD-10-CM | POA: Diagnosis not present

## 2016-07-19 ENCOUNTER — Other Ambulatory Visit (INDEPENDENT_AMBULATORY_CARE_PROVIDER_SITE_OTHER): Payer: Self-pay | Admitting: Vascular Surgery

## 2016-07-19 ENCOUNTER — Encounter (INDEPENDENT_AMBULATORY_CARE_PROVIDER_SITE_OTHER): Payer: Self-pay

## 2016-07-19 DIAGNOSIS — N2581 Secondary hyperparathyroidism of renal origin: Secondary | ICD-10-CM | POA: Diagnosis not present

## 2016-07-19 DIAGNOSIS — D509 Iron deficiency anemia, unspecified: Secondary | ICD-10-CM | POA: Diagnosis not present

## 2016-07-19 DIAGNOSIS — E611 Iron deficiency: Secondary | ICD-10-CM | POA: Diagnosis not present

## 2016-07-19 DIAGNOSIS — N186 End stage renal disease: Secondary | ICD-10-CM | POA: Diagnosis not present

## 2016-07-19 DIAGNOSIS — Z992 Dependence on renal dialysis: Secondary | ICD-10-CM | POA: Diagnosis not present

## 2016-07-21 DIAGNOSIS — N186 End stage renal disease: Secondary | ICD-10-CM | POA: Diagnosis not present

## 2016-07-21 DIAGNOSIS — N2581 Secondary hyperparathyroidism of renal origin: Secondary | ICD-10-CM | POA: Diagnosis not present

## 2016-07-21 DIAGNOSIS — Z992 Dependence on renal dialysis: Secondary | ICD-10-CM | POA: Diagnosis not present

## 2016-07-21 DIAGNOSIS — E611 Iron deficiency: Secondary | ICD-10-CM | POA: Diagnosis not present

## 2016-07-21 DIAGNOSIS — D509 Iron deficiency anemia, unspecified: Secondary | ICD-10-CM | POA: Diagnosis not present

## 2016-07-23 DIAGNOSIS — N2581 Secondary hyperparathyroidism of renal origin: Secondary | ICD-10-CM | POA: Diagnosis not present

## 2016-07-23 DIAGNOSIS — D509 Iron deficiency anemia, unspecified: Secondary | ICD-10-CM | POA: Diagnosis not present

## 2016-07-23 DIAGNOSIS — E611 Iron deficiency: Secondary | ICD-10-CM | POA: Diagnosis not present

## 2016-07-23 DIAGNOSIS — Z992 Dependence on renal dialysis: Secondary | ICD-10-CM | POA: Diagnosis not present

## 2016-07-23 DIAGNOSIS — N186 End stage renal disease: Secondary | ICD-10-CM | POA: Diagnosis not present

## 2016-07-26 DIAGNOSIS — Z992 Dependence on renal dialysis: Secondary | ICD-10-CM | POA: Diagnosis not present

## 2016-07-26 DIAGNOSIS — D509 Iron deficiency anemia, unspecified: Secondary | ICD-10-CM | POA: Diagnosis not present

## 2016-07-26 DIAGNOSIS — N186 End stage renal disease: Secondary | ICD-10-CM | POA: Diagnosis not present

## 2016-07-26 DIAGNOSIS — E611 Iron deficiency: Secondary | ICD-10-CM | POA: Diagnosis not present

## 2016-07-26 DIAGNOSIS — N2581 Secondary hyperparathyroidism of renal origin: Secondary | ICD-10-CM | POA: Diagnosis not present

## 2016-07-28 DIAGNOSIS — N2581 Secondary hyperparathyroidism of renal origin: Secondary | ICD-10-CM | POA: Diagnosis not present

## 2016-07-28 DIAGNOSIS — N186 End stage renal disease: Secondary | ICD-10-CM | POA: Diagnosis not present

## 2016-07-28 DIAGNOSIS — E611 Iron deficiency: Secondary | ICD-10-CM | POA: Diagnosis not present

## 2016-07-28 DIAGNOSIS — Z992 Dependence on renal dialysis: Secondary | ICD-10-CM | POA: Diagnosis not present

## 2016-07-28 DIAGNOSIS — D509 Iron deficiency anemia, unspecified: Secondary | ICD-10-CM | POA: Diagnosis not present

## 2016-07-30 DIAGNOSIS — N186 End stage renal disease: Secondary | ICD-10-CM | POA: Diagnosis not present

## 2016-07-30 DIAGNOSIS — Z992 Dependence on renal dialysis: Secondary | ICD-10-CM | POA: Diagnosis not present

## 2016-07-30 DIAGNOSIS — E611 Iron deficiency: Secondary | ICD-10-CM | POA: Diagnosis not present

## 2016-07-30 DIAGNOSIS — N2581 Secondary hyperparathyroidism of renal origin: Secondary | ICD-10-CM | POA: Diagnosis not present

## 2016-07-30 DIAGNOSIS — D509 Iron deficiency anemia, unspecified: Secondary | ICD-10-CM | POA: Diagnosis not present

## 2016-08-01 ENCOUNTER — Ambulatory Visit
Admission: RE | Admit: 2016-08-01 | Discharge: 2016-08-01 | Disposition: A | Discharge status: Home / Self Care | Payer: Medicare Other | Source: Ambulatory Visit | Attending: Vascular Surgery | Admitting: Vascular Surgery

## 2016-08-01 ENCOUNTER — Encounter
Admission: RE | Disposition: A | Discharge status: Home / Self Care | Payer: Self-pay | Source: Ambulatory Visit | Attending: Vascular Surgery

## 2016-08-01 DIAGNOSIS — N186 End stage renal disease: Secondary | ICD-10-CM | POA: Diagnosis not present

## 2016-08-01 DIAGNOSIS — Z9889 Other specified postprocedural states: Secondary | ICD-10-CM | POA: Insufficient documentation

## 2016-08-01 DIAGNOSIS — I509 Heart failure, unspecified: Secondary | ICD-10-CM | POA: Insufficient documentation

## 2016-08-01 DIAGNOSIS — Z452 Encounter for adjustment and management of vascular access device: Secondary | ICD-10-CM | POA: Diagnosis not present

## 2016-08-01 DIAGNOSIS — I10 Essential (primary) hypertension: Secondary | ICD-10-CM | POA: Diagnosis not present

## 2016-08-01 DIAGNOSIS — T82868A Thrombosis of vascular prosthetic devices, implants and grafts, initial encounter: Secondary | ICD-10-CM | POA: Diagnosis not present

## 2016-08-01 DIAGNOSIS — Z8679 Personal history of other diseases of the circulatory system: Secondary | ICD-10-CM | POA: Insufficient documentation

## 2016-08-01 DIAGNOSIS — Z8249 Family history of ischemic heart disease and other diseases of the circulatory system: Secondary | ICD-10-CM | POA: Diagnosis not present

## 2016-08-01 DIAGNOSIS — Z87891 Personal history of nicotine dependence: Secondary | ICD-10-CM | POA: Diagnosis not present

## 2016-08-01 DIAGNOSIS — Z992 Dependence on renal dialysis: Secondary | ICD-10-CM | POA: Insufficient documentation

## 2016-08-01 DIAGNOSIS — I132 Hypertensive heart and chronic kidney disease with heart failure and with stage 5 chronic kidney disease, or end stage renal disease: Secondary | ICD-10-CM | POA: Diagnosis not present

## 2016-08-01 HISTORY — PX: DIALYSIS/PERMA CATHETER REMOVAL: CATH118289

## 2016-08-01 SURGERY — DIALYSIS/PERMA CATHETER REMOVAL
Anesthesia: Moderate Sedation

## 2016-08-01 MED ORDER — BACITRACIN-NEOMYCIN-POLYMYXIN 400-5-5000 EX OINT
TOPICAL_OINTMENT | CUTANEOUS | Status: AC
  Filled 2016-08-01: qty 1

## 2016-08-01 MED ORDER — LIDOCAINE-EPINEPHRINE (PF) 2 %-1:200000 IJ SOLN
INTRAMUSCULAR | Status: AC
  Filled 2016-08-01: qty 20

## 2016-08-01 SURGICAL SUPPLY — 1 items: TRAY LACERAT/PLASTIC (MISCELLANEOUS) ×2 IMPLANT

## 2016-08-01 NOTE — Discharge Instructions (Signed)
Tunneled Catheter removal, Care After Refer to this sheet in the next few weeks. These instructions provide you with information about caring for yourself after your procedure. Your health care provider may also give you more specific instructions. Your treatment has been planned according to current medical practices, but problems sometimes occur. Call your health care provider if you have any problems or questions after your procedure. What can I expect after the procedure? After the procedure, it is common to have:  Some mild redness, swelling, and pain around your catheter site.  A small amount of blood or clear fluid coming from your incisions. Follow these instructions at home: Incision care   Check your incision areas every day for signs of infection. Check for:  More redness, swelling, or pain.  More fluid or blood.  Warmth.  Pus or a bad smell.  Follow instructions from your health care provider about how to take care of your incisions. Make sure you:  Wash your hands with soap and water before you change your bandages (dressings). If soap and water are not available, use hand sanitizer.  Change your dressings as told by your health care provider. Wash the area around your incisions with a germ-killing (antiseptic) solution when you change your dressing, as told by your health care provider.  Leave stitches (sutures), skin glue, or adhesive strips in place. These skin closures may need to stay in place for 2 weeks or longer. If adhesive strip edges start to loosen and curl up, you may trim the loose edges. Do not remove adhesive strips completely unless your health care provider tells you to do that.   Contact a health care provider if:  You have more fluid or blood coming from your incisions.  You have more redness, swelling, or pain at your incisions or around the area where your catheter is removed  Your incisions feel warm to the touch.  You feel unusually  weak.  You feel nauseous.       Get help right away if:  You have swelling in your arm, shoulder, neck, or face.  You develop chest pain.  You have difficulty breathing.  You feel dizzy or light-headed.  You have pus or a bad smell coming from your incisions.  You have a fever.  You develop bleeding from your catheter removal site, and your bleeding does not stop. This information is not intended to replace advice given to you by your health care provider. Make sure you discuss any questions you have with your health care provider. Document Released: 03/21/2012 Document Revised: 12/06/2015 Document Reviewed: 12/29/2014 Elsevier Interactive Patient Education  2017 [employer].

## 2016-08-01 NOTE — Op Note (Signed)
Operative Note     Preoperative diagnosis:   1. ESRD with functional permanent access  Postoperative diagnosis:  1. ESRD with functional permanent access  Procedure:  Removal of right jugular Permcath  Surgeon:  Leotis Pain, MD  Anesthesia:  Local  EBL:  Minimal  Indication for the Procedure:  The patient has a functional permanent dialysis access and no longer needs their permcath.  This can be removed.  Risks and benefits are discussed and informed consent is obtained.  Description of the Procedure:  The patient's right neck, chest and existing catheter were sterilely prepped and draped. The area around the catheter was anesthetized copiously with 1% lidocaine. The catheter was dissected out with curved hemostats until the cuff was freed from the surrounding fibrous sheath. The fiber sheath was transected, and the catheter was then removed in its entirety using gentle traction. Pressure was held and sterile dressings were placed. The patient tolerated the procedure well and was taken to the recovery room in stable condition.     Leotis Pain  08/01/2016, 1:04 PM This note was created with Dragon Medical transcription system. Any errors in dictation are purely unintentional.

## 2016-08-01 NOTE — H&P (Signed)
[facility] Admission History & Physical  MRN : [id number]  [name] [name] is a 51 y.o. (05/22/1965) male who presents with chief complaint of No chief complaint on file. [name]  History of Present Illness: I am asked to evaluate the patient by the dialysis center. The patient was sent here because they have a nonfunctioning tunneled catheter and a functioning AVF.  The patient reports they're not been any problems with any of their dialysis runs. They are reporting good flows with good parameters at dialysis.  Patient denies pain or tenderness overlying the access.  There is no pain with dialysis.  The patient denies hand pain or finger pain consistent with steal syndrome.  No fevers or chills while on dialysis.   No current facility-administered medications for this encounter.     Past Medical History:  Diagnosis Date  . Aneurysm ([place])     Right arm fistula 3 aneurysms 2011,   plans to have a new procedure  . Angina    occasional, last 6 mo ago  . Chest discomfort     2008  . CHF (congestive heart failure) ([place])   . Dialysis patient [facility])    M-W-F @ [place]  . Dysrhythmia   . Ejection fraction     65%, echo, 2008   /   EF 55-60%, echo, October 28, 2010  . ESRD (end stage renal disease) ([place])    on hemodialysis T_T_S  . Hypertension   . Leg pain   . Orthostatic hypotension     2008  . Overweight(278.02)   . Sinus tachycardia     2008, TSH normal ... happens frequently with heart rate at 120-130  . Syncope     positional after dialysis... 2008    Past Surgical History:  Procedure Laterality Date  . A/V SHUNTOGRAM Left 06/30/2016   Procedure: A/V Fistulagram;  Surgeon: Algernon Huxley, MD;  Location: [facility];  Service: Cardiovascular;  Laterality: Left;  . ARTERIOVENOUS GRAFT PLACEMENT    . AV FISTULA PLACEMENT    . AV FISTULA PLACEMENT Left 11/18/2015   Procedure: ARTERIOVENOUS (AV) FISTULA CREATION ( RADIOCEPHALIC );  Surgeon: Algernon Huxley, MD;   Location: [facility];  Service: Vascular;  Laterality: Left;  . AV FISTULA PLACEMENT Left 03/23/2016   Procedure: ARTERIOVENOUS (AV) FISTULA CREATION ( REVISION );  Surgeon: Algernon Huxley, MD;  Location: [facility];  Service: Vascular;  Laterality: Left;  . [place] REMOVAL Left 03/23/2016   Procedure: REMOVAL OF ARTERIOVENOUS GORETEX GRAFT ([place]);  Surgeon: Algernon Huxley, MD;  Location: [facility];  Service: Vascular;  Laterality: Left;  . HERNIA REPAIR    . INSERTION OF DIALYSIS CATHETER  03/01/2011   Procedure: INSERTION OF DIALYSIS CATHETER;  Surgeon: Elam Dutch, MD;  Location: [facility];  Service: Vascular;  Laterality: Left;  Exchange of Dialysis Catheter to 27cm 15Fr. Arrow Catheter  . PERIPHERAL VASCULAR CATHETERIZATION N/A 09/01/2014   Procedure: A/V Shuntogram/Fistulagram;  Surgeon: Algernon Huxley, MD;  Location: [facility];  Service: Cardiovascular;  Laterality: N/A;  . PERIPHERAL VASCULAR CATHETERIZATION Right 09/01/2014   Procedure: Thrombectomy;  Surgeon: Algernon Huxley, MD;  Location: [facility];  Service: Cardiovascular;  Laterality: Right;  . PERIPHERAL VASCULAR CATHETERIZATION Right 09/01/2014   Procedure: A/V Shunt Intervention;  Surgeon: Algernon Huxley, MD;  Location: [facility];  Service: Cardiovascular;  Laterality: Right;  . PERIPHERAL VASCULAR CATHETERIZATION N/A 09/17/2014   Procedure: A/V  Shuntogram/Fistulagram;  Surgeon: Algernon Huxley, MD;  Location: [facility];  Service: Cardiovascular;  Laterality: N/A;  . PERIPHERAL VASCULAR CATHETERIZATION Right 10/17/2014   Procedure: Thrombectomy;  Surgeon: Katha Cabal, MD;  Location: [facility];  Service: Cardiovascular;  Laterality: Right;  . PERIPHERAL VASCULAR CATHETERIZATION N/A 10/17/2014   Procedure: A/V Shuntogram/Fistulagram;  Surgeon: Katha Cabal, MD;  Location: [facility];  Service: Cardiovascular;  Laterality: N/A;  . PERIPHERAL VASCULAR CATHETERIZATION N/A 10/17/2014   Procedure:  A/V Shunt Intervention;  Surgeon: Katha Cabal, MD;  Location: [facility];  Service: Cardiovascular;  Laterality: N/A;  . PERIPHERAL VASCULAR CATHETERIZATION Right 11/04/2014   Procedure: Thrombectomy;  Surgeon: Katha Cabal, MD;  Location: [facility];  Service: Cardiovascular;  Laterality: Right;  . PERIPHERAL VASCULAR CATHETERIZATION Left 11/04/2014   Procedure: Visceral Venography;  Surgeon: Katha Cabal, MD;  Location: [facility];  Service: Cardiovascular;  Laterality: Left;  . PERIPHERAL VASCULAR CATHETERIZATION Right 11/27/2014   Procedure: A/V Shuntogram/Fistulagram;  Surgeon: Algernon Huxley, MD;  Location: [facility];  Service: Cardiovascular;  Laterality: Right;  . PERIPHERAL VASCULAR CATHETERIZATION Right 11/27/2014   Procedure: A/V Shunt Intervention;  Surgeon: Algernon Huxley, MD;  Location: [facility];  Service: Cardiovascular;  Laterality: Right;  . PERIPHERAL VASCULAR CATHETERIZATION Right 12/31/2014   Procedure: Thrombectomy;  Surgeon: Algernon Huxley, MD;  Location: [facility];  Service: Cardiovascular;  Laterality: Right;  . PERIPHERAL VASCULAR CATHETERIZATION Right 01/12/2015   Procedure: A/V Shuntogram/Fistulagram;  Surgeon: Algernon Huxley, MD;  Location: [facility];  Service: Cardiovascular;  Laterality: Right;  . PERIPHERAL VASCULAR CATHETERIZATION N/A 01/12/2015   Procedure: A/V Shunt Intervention;  Surgeon: Algernon Huxley, MD;  Location: [facility];  Service: Cardiovascular;  Laterality: N/A;  . PERIPHERAL VASCULAR CATHETERIZATION Right 01/28/2015   Procedure: Thrombectomy;  Surgeon: Algernon Huxley, MD;  Location: [facility];  Service: Cardiovascular;  Laterality: Right;  . PERIPHERAL VASCULAR CATHETERIZATION N/A 06/08/2015   Procedure: A/V Shuntogram/Fistulagram;  Surgeon: Algernon Huxley, MD;  Location: [facility];  Service: Cardiovascular;  Laterality: N/A;  . PERIPHERAL VASCULAR  CATHETERIZATION N/A 06/08/2015   Procedure: A/V Shunt Intervention;  Surgeon: Algernon Huxley, MD;  Location: [facility];  Service: Cardiovascular;  Laterality: N/A;  . PERIPHERAL VASCULAR CATHETERIZATION Right 07/06/2015   Procedure: A/V Shuntogram/Fistulagram;  Surgeon: Algernon Huxley, MD;  Location: [facility];  Service: Cardiovascular;  Laterality: Right;  . PERIPHERAL VASCULAR CATHETERIZATION N/A 07/06/2015   Procedure: A/V Shunt Intervention;  Surgeon: Algernon Huxley, MD;  Location: [facility];  Service: Cardiovascular;  Laterality: N/A;  . PERIPHERAL VASCULAR CATHETERIZATION N/A 08/04/2015   Procedure: graft declot;  Surgeon: Katha Cabal, MD;  Location: [facility];  Service: Cardiovascular;  Laterality: N/A;  . PERIPHERAL VASCULAR CATHETERIZATION Right 08/25/2015   Procedure: A/V Shuntogram/Fistulagram;  Surgeon: Katha Cabal, MD;  Location: [place] [facility];  Service: Cardiovascular;  Laterality: Right;  . PERIPHERAL VASCULAR CATHETERIZATION N/A 11/04/2015   Procedure: Dialysis/Perma Catheter Insertion;  Surgeon: Algernon Huxley, MD;  Location: [facility];  Service: Cardiovascular;  Laterality: N/A;  . PERIPHERAL VASCULAR CATHETERIZATION Left 12/14/2015   Procedure: A/V Shuntogram/Fistulagram;  Surgeon: Algernon Huxley, MD;  Location: [facility];  Service: Cardiovascular;  Laterality: Left;  . PERIPHERAL VASCULAR CATHETERIZATION N/A 12/14/2015  Procedure: A/V Shunt Intervention;  Surgeon: Algernon Huxley, MD;  Location: [facility];  Service: Cardiovascular;  Laterality: N/A;  . PERIPHERAL VASCULAR CATHETERIZATION N/A 12/17/2015   Procedure: Dialysis/Perma Catheter Insertion;  Surgeon: Algernon Huxley, MD;  Location: [facility];  Service: Cardiovascular;  Laterality: N/A;  . PERIPHERAL VASCULAR CATHETERIZATION Left 12/22/2015   Procedure: Dialysis/Perma Catheter Insertion;  Surgeon: Katha Cabal, MD;  Location: [facility];  Service: Cardiovascular;  Laterality: Left;  . PERIPHERAL VASCULAR CATHETERIZATION Left 02/29/2016   Procedure: A/V Shuntogram/Fistulagram;  Surgeon: Algernon Huxley, MD;  Location: [place] [facility];  Service: Cardiovascular;  Laterality: Left;  . PERIPHERAL VASCULAR CATHETERIZATION N/A 02/29/2016   Procedure: A/V Shunt Intervention;  Surgeon: Algernon Huxley, MD;  Location: [facility];  Service: Cardiovascular;  Laterality: N/A;  . right arm graft     for dyalisis  . THROMBECTOMY    . THROMBECTOMY W/ EMBOLECTOMY  03/01/2011   Procedure: THROMBECTOMY ARTERIOVENOUS GORE-TEX GRAFT;  Surgeon: Elam Dutch, MD;  Location: [facility];  Service: Vascular;  Laterality: Right;  Attempted Thrombectomy of Old  Right Upper Arm Arteriovenous gortex Graft. Insertion of new Arteriovenous Graft using 80mm x 50cm Gortex Stretch graft.   . THROMBECTOMY W/ EMBOLECTOMY  07/11/2011   Procedure: THROMBECTOMY ARTERIOVENOUS GORE-TEX GRAFT;  Surgeon: Rosetta Posner, MD;  Location: [facility];  Service: Vascular;  Laterality: Right;  . UMBILICAL HERNIA REPAIR    . VENOGRAM N/A 08/23/2011   Procedure: VENOGRAM;  Surgeon: [name], MD;  Location: [facility];  Service: Cardiovascular;  Laterality: N/A;    Social History Social History  Substance Use Topics  . Smoking status: Former Smoker    Types: Cigarettes    Quit date: 04/18/1994  . Smokeless tobacco: Current User    Types: Snuff     Comment: dips 1/2 snuff per day times 25 years  . Alcohol use No    Family History Family History  Problem Relation Age of Onset  . Hypertension Mother   . Heart disease Mother   . Hypertension Father   . Heart disease Father     No family history of bleeding or clotting disorders, autoimmune disease or porphyria  No Known Allergies   REVIEW OF SYSTEMS (Negative unless checked)  Constitutional: [] Weight loss  [] Fever  [] Chills Cardiac: [] Chest pain   [] Chest pressure   [] Palpitations   [] Shortness of breath  when laying flat   [] Shortness of breath at rest   [x] Shortness of breath with exertion. Vascular:  [] Pain in legs with walking   [] Pain in legs at rest   [] Pain in legs when laying flat   [] Claudication   [] Pain in feet when walking  [] Pain in feet at rest  [] Pain in feet when laying flat   [] History of DVT   [] Phlebitis   [] Swelling in legs   [] Varicose veins   [] Non-healing ulcers Pulmonary:   [] Uses home oxygen   [] Productive cough   [] Hemoptysis   [] Wheeze  [] COPD   [] Asthma Neurologic:  [] Dizziness  [] Blackouts   [] Seizures   [] History of stroke   [] History of TIA  [] Aphasia   [] Temporary blindness   [] Dysphagia   [] Weakness or numbness in arms   [] Weakness or numbness in legs Musculoskeletal:  [] Arthritis   [] Joint swelling   [] Joint pain   [] Low back pain Hematologic:  [] Easy bruising  [] Easy bleeding   [] Hypercoagulable state   [] Anemic  [] Hepatitis  Gastrointestinal:  [] Blood in stool   [] Vomiting blood  [] Gastroesophageal reflux/heartburn   [] Difficulty swallowing. Genitourinary:  [x] Chronic kidney disease   [] Difficult urination  [] Frequent urination  [] Burning with urination   [] Blood in urine Skin:  [] Rashes   [] Ulcers   [] Wounds Psychological:  [] History of anxiety   []  History of major depression.  Physical Examination  Vitals:   08/01/16 1041  BP: 137/81  Resp: 18  SpO2: 97%   There is no height or weight on file to calculate BMI. Gen: WD/WN, NAD Head: [place]/AT, No temporalis wasting.  Ear/Nose/Throat: Hearing grossly intact, nares w/o erythema or drainage, oropharynx w/o Erythema/Exudate,  Eyes: Conjunctiva clear, sclera non-icteric Neck: Trachea midline.  No JVD.  Pulmonary:  Good air movement, respirations not labored, no use of accessory muscles.  Cardiac: RRR, normal S1, S2. Vascular: thrill in left arm AVF, permcath in subclavicular location without erythema Vessel Right Left  Radial Palpable Palpable  Ulnar Not Palpable Not Palpable  Brachial Palpable Palpable   Carotid Palpable, without bruit Palpable, without bruit  Gastrointestinal: soft, non-tender/non-distended. No guarding/reflex.  Musculoskeletal: M/S 5/5 throughout.  Extremities without ischemic changes.  No deformity or atrophy.  Neurologic: Sensation grossly intact in extremities.  Symmetrical.  Speech is fluent. Motor exam as listed above. Psychiatric: Judgment intact, Mood & affect appropriate for pt's clinical situation. Dermatologic: No rashes or ulcers noted.  No cellulitis or open wounds. Lymph : No Cervical, Axillary, or Inguinal lymphadenopathy.   CBC Lab Results  Component Value Date   WBC 8.7 03/16/2016   HGB 16.3 03/23/2016   HCT 48.0 03/23/2016   MCV 77.1 (L) 03/16/2016   PLT 286 03/16/2016    BMET    Component Value Date/Time   NA 139 03/23/2016 1011   NA 137 11/13/2013 0408   K 4.0 03/23/2016 1011   K 4.1 11/13/2013 0408   CL 99 (L) 03/16/2016 0948   CL 98 11/13/2013 0408   CO2 28 03/16/2016 0948   CO2 25 11/13/2013 0408   GLUCOSE 80 03/23/2016 1011   GLUCOSE 64 (L) 11/13/2013 0408   BUN 34 (H) 03/16/2016 0948   BUN 68 (H) 11/13/2013 0408   CREATININE 11.38 (H) 03/16/2016 0948   CREATININE 16.45 (H) 11/13/2013 0408   CALCIUM 8.6 (L) 03/16/2016 0948   CALCIUM 6.8 (LL) 11/13/2013 0408   GFRNONAA 5 (L) 03/16/2016 0948   GFRNONAA 3 (L) 11/13/2013 0408   GFRAA 5 (L) 03/16/2016 0948   GFRAA 3 (L) 11/13/2013 0408   CrCl cannot be calculated (Patient's most recent lab result is older than the maximum 21 days allowed.).  COAG Lab Results  Component Value Date   INR 1.26 03/16/2016   INR 1.25 11/13/2015   INR 1.05 08/08/2012    Radiology No results found.  Assessment/Plan 1.  Complication dialysis device with thrombosis AV access:  Patient's Permcath is no longer being used. The patient has an extremity access that is functioning well. Therefore, the patient will undergo removal of the tunneled catheter under local anesthesia.  The risks and benefits  were described to the patient.  All questions were answered.  The patient agrees to proceed with angiography and intervention. Potassium will be drawn to ensure that it is an appropriate level prior to performing intervention. 2.  End-stage renal disease requiring hemodialysis:  Patient will continue dialysis therapy without further interruption with his AVF 3.  Hypertension:  Patient will continue medical management; nephrology is following no changes in oral medications.  Leotis Pain, MD  08/01/2016 12:28 PM

## 2016-08-02 ENCOUNTER — Encounter: Payer: Self-pay | Admitting: Vascular Surgery

## 2016-08-02 DIAGNOSIS — N186 End stage renal disease: Secondary | ICD-10-CM | POA: Diagnosis not present

## 2016-08-02 DIAGNOSIS — Z992 Dependence on renal dialysis: Secondary | ICD-10-CM | POA: Diagnosis not present

## 2016-08-02 DIAGNOSIS — E611 Iron deficiency: Secondary | ICD-10-CM | POA: Diagnosis not present

## 2016-08-02 DIAGNOSIS — D509 Iron deficiency anemia, unspecified: Secondary | ICD-10-CM | POA: Diagnosis not present

## 2016-08-02 DIAGNOSIS — N2581 Secondary hyperparathyroidism of renal origin: Secondary | ICD-10-CM | POA: Diagnosis not present

## 2016-08-04 DIAGNOSIS — E611 Iron deficiency: Secondary | ICD-10-CM | POA: Diagnosis not present

## 2016-08-04 DIAGNOSIS — D509 Iron deficiency anemia, unspecified: Secondary | ICD-10-CM | POA: Diagnosis not present

## 2016-08-04 DIAGNOSIS — N186 End stage renal disease: Secondary | ICD-10-CM | POA: Diagnosis not present

## 2016-08-04 DIAGNOSIS — Z992 Dependence on renal dialysis: Secondary | ICD-10-CM | POA: Diagnosis not present

## 2016-08-04 DIAGNOSIS — N2581 Secondary hyperparathyroidism of renal origin: Secondary | ICD-10-CM | POA: Diagnosis not present

## 2016-08-06 DIAGNOSIS — N186 End stage renal disease: Secondary | ICD-10-CM | POA: Diagnosis not present

## 2016-08-06 DIAGNOSIS — D509 Iron deficiency anemia, unspecified: Secondary | ICD-10-CM | POA: Diagnosis not present

## 2016-08-06 DIAGNOSIS — E611 Iron deficiency: Secondary | ICD-10-CM | POA: Diagnosis not present

## 2016-08-06 DIAGNOSIS — Z992 Dependence on renal dialysis: Secondary | ICD-10-CM | POA: Diagnosis not present

## 2016-08-06 DIAGNOSIS — N2581 Secondary hyperparathyroidism of renal origin: Secondary | ICD-10-CM | POA: Diagnosis not present

## 2016-08-09 DIAGNOSIS — N2581 Secondary hyperparathyroidism of renal origin: Secondary | ICD-10-CM | POA: Diagnosis not present

## 2016-08-09 DIAGNOSIS — Z992 Dependence on renal dialysis: Secondary | ICD-10-CM | POA: Diagnosis not present

## 2016-08-09 DIAGNOSIS — N186 End stage renal disease: Secondary | ICD-10-CM | POA: Diagnosis not present

## 2016-08-09 DIAGNOSIS — E611 Iron deficiency: Secondary | ICD-10-CM | POA: Diagnosis not present

## 2016-08-09 DIAGNOSIS — D509 Iron deficiency anemia, unspecified: Secondary | ICD-10-CM | POA: Diagnosis not present

## 2016-08-11 DIAGNOSIS — D509 Iron deficiency anemia, unspecified: Secondary | ICD-10-CM | POA: Diagnosis not present

## 2016-08-11 DIAGNOSIS — Z992 Dependence on renal dialysis: Secondary | ICD-10-CM | POA: Diagnosis not present

## 2016-08-11 DIAGNOSIS — N2581 Secondary hyperparathyroidism of renal origin: Secondary | ICD-10-CM | POA: Diagnosis not present

## 2016-08-11 DIAGNOSIS — N186 End stage renal disease: Secondary | ICD-10-CM | POA: Diagnosis not present

## 2016-08-11 DIAGNOSIS — E611 Iron deficiency: Secondary | ICD-10-CM | POA: Diagnosis not present

## 2016-08-13 DIAGNOSIS — E611 Iron deficiency: Secondary | ICD-10-CM | POA: Diagnosis not present

## 2016-08-13 DIAGNOSIS — N2581 Secondary hyperparathyroidism of renal origin: Secondary | ICD-10-CM | POA: Diagnosis not present

## 2016-08-13 DIAGNOSIS — Z992 Dependence on renal dialysis: Secondary | ICD-10-CM | POA: Diagnosis not present

## 2016-08-13 DIAGNOSIS — N186 End stage renal disease: Secondary | ICD-10-CM | POA: Diagnosis not present

## 2016-08-13 DIAGNOSIS — D509 Iron deficiency anemia, unspecified: Secondary | ICD-10-CM | POA: Diagnosis not present

## 2016-08-15 DIAGNOSIS — N186 End stage renal disease: Secondary | ICD-10-CM | POA: Diagnosis not present

## 2016-08-15 DIAGNOSIS — Z992 Dependence on renal dialysis: Secondary | ICD-10-CM | POA: Diagnosis not present

## 2016-08-16 DIAGNOSIS — N186 End stage renal disease: Secondary | ICD-10-CM | POA: Diagnosis not present

## 2016-08-16 DIAGNOSIS — E611 Iron deficiency: Secondary | ICD-10-CM | POA: Diagnosis not present

## 2016-08-16 DIAGNOSIS — N2581 Secondary hyperparathyroidism of renal origin: Secondary | ICD-10-CM | POA: Diagnosis not present

## 2016-08-16 DIAGNOSIS — Z992 Dependence on renal dialysis: Secondary | ICD-10-CM | POA: Diagnosis not present

## 2016-08-18 DIAGNOSIS — N186 End stage renal disease: Secondary | ICD-10-CM | POA: Diagnosis not present

## 2016-08-18 DIAGNOSIS — N2581 Secondary hyperparathyroidism of renal origin: Secondary | ICD-10-CM | POA: Diagnosis not present

## 2016-08-18 DIAGNOSIS — Z992 Dependence on renal dialysis: Secondary | ICD-10-CM | POA: Diagnosis not present

## 2016-08-18 DIAGNOSIS — E611 Iron deficiency: Secondary | ICD-10-CM | POA: Diagnosis not present

## 2016-08-20 DIAGNOSIS — N186 End stage renal disease: Secondary | ICD-10-CM | POA: Diagnosis not present

## 2016-08-20 DIAGNOSIS — E611 Iron deficiency: Secondary | ICD-10-CM | POA: Diagnosis not present

## 2016-08-20 DIAGNOSIS — Z992 Dependence on renal dialysis: Secondary | ICD-10-CM | POA: Diagnosis not present

## 2016-08-20 DIAGNOSIS — N2581 Secondary hyperparathyroidism of renal origin: Secondary | ICD-10-CM | POA: Diagnosis not present

## 2016-08-23 DIAGNOSIS — N2581 Secondary hyperparathyroidism of renal origin: Secondary | ICD-10-CM | POA: Diagnosis not present

## 2016-08-23 DIAGNOSIS — N186 End stage renal disease: Secondary | ICD-10-CM | POA: Diagnosis not present

## 2016-08-23 DIAGNOSIS — E611 Iron deficiency: Secondary | ICD-10-CM | POA: Diagnosis not present

## 2016-08-23 DIAGNOSIS — Z992 Dependence on renal dialysis: Secondary | ICD-10-CM | POA: Diagnosis not present

## 2016-08-24 ENCOUNTER — Ambulatory Visit (INDEPENDENT_AMBULATORY_CARE_PROVIDER_SITE_OTHER): Payer: Medicare Other

## 2016-08-24 ENCOUNTER — Encounter (INDEPENDENT_AMBULATORY_CARE_PROVIDER_SITE_OTHER): Payer: Self-pay | Admitting: Vascular Surgery

## 2016-08-24 ENCOUNTER — Ambulatory Visit (INDEPENDENT_AMBULATORY_CARE_PROVIDER_SITE_OTHER): Payer: Medicare Other | Admitting: Vascular Surgery

## 2016-08-24 ENCOUNTER — Other Ambulatory Visit (INDEPENDENT_AMBULATORY_CARE_PROVIDER_SITE_OTHER): Payer: Self-pay | Admitting: Vascular Surgery

## 2016-08-24 VITALS — BP 155/85 | HR 106 | Resp 16 | Ht 72.0 in | Wt 340.0 lb

## 2016-08-24 DIAGNOSIS — T829XXD Unspecified complication of cardiac and vascular prosthetic device, implant and graft, subsequent encounter: Secondary | ICD-10-CM

## 2016-08-24 DIAGNOSIS — N185 Chronic kidney disease, stage 5: Secondary | ICD-10-CM

## 2016-08-24 DIAGNOSIS — N186 End stage renal disease: Secondary | ICD-10-CM | POA: Diagnosis not present

## 2016-08-25 DIAGNOSIS — N2581 Secondary hyperparathyroidism of renal origin: Secondary | ICD-10-CM | POA: Diagnosis not present

## 2016-08-25 DIAGNOSIS — N186 End stage renal disease: Secondary | ICD-10-CM | POA: Diagnosis not present

## 2016-08-25 DIAGNOSIS — Z992 Dependence on renal dialysis: Secondary | ICD-10-CM | POA: Diagnosis not present

## 2016-08-25 DIAGNOSIS — E611 Iron deficiency: Secondary | ICD-10-CM | POA: Diagnosis not present

## 2016-08-27 DIAGNOSIS — Z992 Dependence on renal dialysis: Secondary | ICD-10-CM | POA: Diagnosis not present

## 2016-08-27 DIAGNOSIS — N2581 Secondary hyperparathyroidism of renal origin: Secondary | ICD-10-CM | POA: Diagnosis not present

## 2016-08-27 DIAGNOSIS — E611 Iron deficiency: Secondary | ICD-10-CM | POA: Diagnosis not present

## 2016-08-27 DIAGNOSIS — N186 End stage renal disease: Secondary | ICD-10-CM | POA: Diagnosis not present

## 2016-08-30 DIAGNOSIS — N186 End stage renal disease: Secondary | ICD-10-CM | POA: Diagnosis not present

## 2016-08-30 DIAGNOSIS — Z992 Dependence on renal dialysis: Secondary | ICD-10-CM | POA: Diagnosis not present

## 2016-08-30 DIAGNOSIS — N2581 Secondary hyperparathyroidism of renal origin: Secondary | ICD-10-CM | POA: Diagnosis not present

## 2016-08-30 DIAGNOSIS — E611 Iron deficiency: Secondary | ICD-10-CM | POA: Diagnosis not present

## 2016-09-01 DIAGNOSIS — N2581 Secondary hyperparathyroidism of renal origin: Secondary | ICD-10-CM | POA: Diagnosis not present

## 2016-09-01 DIAGNOSIS — N186 End stage renal disease: Secondary | ICD-10-CM | POA: Diagnosis not present

## 2016-09-01 DIAGNOSIS — Z992 Dependence on renal dialysis: Secondary | ICD-10-CM | POA: Diagnosis not present

## 2016-09-01 DIAGNOSIS — E611 Iron deficiency: Secondary | ICD-10-CM | POA: Diagnosis not present

## 2016-09-03 DIAGNOSIS — N2581 Secondary hyperparathyroidism of renal origin: Secondary | ICD-10-CM | POA: Diagnosis not present

## 2016-09-03 DIAGNOSIS — Z992 Dependence on renal dialysis: Secondary | ICD-10-CM | POA: Diagnosis not present

## 2016-09-03 DIAGNOSIS — N186 End stage renal disease: Secondary | ICD-10-CM | POA: Diagnosis not present

## 2016-09-03 DIAGNOSIS — E611 Iron deficiency: Secondary | ICD-10-CM | POA: Diagnosis not present

## 2016-09-06 DIAGNOSIS — E611 Iron deficiency: Secondary | ICD-10-CM | POA: Diagnosis not present

## 2016-09-06 DIAGNOSIS — N186 End stage renal disease: Secondary | ICD-10-CM | POA: Diagnosis not present

## 2016-09-06 DIAGNOSIS — N2581 Secondary hyperparathyroidism of renal origin: Secondary | ICD-10-CM | POA: Diagnosis not present

## 2016-09-06 DIAGNOSIS — Z992 Dependence on renal dialysis: Secondary | ICD-10-CM | POA: Diagnosis not present

## 2016-09-08 DIAGNOSIS — N2581 Secondary hyperparathyroidism of renal origin: Secondary | ICD-10-CM | POA: Diagnosis not present

## 2016-09-08 DIAGNOSIS — E611 Iron deficiency: Secondary | ICD-10-CM | POA: Diagnosis not present

## 2016-09-08 DIAGNOSIS — N186 End stage renal disease: Secondary | ICD-10-CM | POA: Diagnosis not present

## 2016-09-08 DIAGNOSIS — Z992 Dependence on renal dialysis: Secondary | ICD-10-CM | POA: Diagnosis not present

## 2016-09-10 DIAGNOSIS — Z992 Dependence on renal dialysis: Secondary | ICD-10-CM | POA: Diagnosis not present

## 2016-09-10 DIAGNOSIS — E611 Iron deficiency: Secondary | ICD-10-CM | POA: Diagnosis not present

## 2016-09-10 DIAGNOSIS — N186 End stage renal disease: Secondary | ICD-10-CM | POA: Diagnosis not present

## 2016-09-10 DIAGNOSIS — N2581 Secondary hyperparathyroidism of renal origin: Secondary | ICD-10-CM | POA: Diagnosis not present

## 2016-09-13 DIAGNOSIS — Z992 Dependence on renal dialysis: Secondary | ICD-10-CM | POA: Diagnosis not present

## 2016-09-13 DIAGNOSIS — N2581 Secondary hyperparathyroidism of renal origin: Secondary | ICD-10-CM | POA: Diagnosis not present

## 2016-09-13 DIAGNOSIS — N186 End stage renal disease: Secondary | ICD-10-CM | POA: Diagnosis not present

## 2016-09-13 DIAGNOSIS — E611 Iron deficiency: Secondary | ICD-10-CM | POA: Diagnosis not present

## 2016-09-15 DIAGNOSIS — E611 Iron deficiency: Secondary | ICD-10-CM | POA: Diagnosis not present

## 2016-09-15 DIAGNOSIS — N186 End stage renal disease: Secondary | ICD-10-CM | POA: Diagnosis not present

## 2016-09-15 DIAGNOSIS — N2581 Secondary hyperparathyroidism of renal origin: Secondary | ICD-10-CM | POA: Diagnosis not present

## 2016-09-15 DIAGNOSIS — Z992 Dependence on renal dialysis: Secondary | ICD-10-CM | POA: Diagnosis not present

## 2016-09-17 DIAGNOSIS — E611 Iron deficiency: Secondary | ICD-10-CM | POA: Diagnosis not present

## 2016-09-17 DIAGNOSIS — N2581 Secondary hyperparathyroidism of renal origin: Secondary | ICD-10-CM | POA: Diagnosis not present

## 2016-09-17 DIAGNOSIS — Z992 Dependence on renal dialysis: Secondary | ICD-10-CM | POA: Diagnosis not present

## 2016-09-17 DIAGNOSIS — N186 End stage renal disease: Secondary | ICD-10-CM | POA: Diagnosis not present

## 2016-09-20 DIAGNOSIS — E611 Iron deficiency: Secondary | ICD-10-CM | POA: Diagnosis not present

## 2016-09-20 DIAGNOSIS — N186 End stage renal disease: Secondary | ICD-10-CM | POA: Diagnosis not present

## 2016-09-20 DIAGNOSIS — N2581 Secondary hyperparathyroidism of renal origin: Secondary | ICD-10-CM | POA: Diagnosis not present

## 2016-09-20 DIAGNOSIS — Z992 Dependence on renal dialysis: Secondary | ICD-10-CM | POA: Diagnosis not present

## 2016-09-22 DIAGNOSIS — E611 Iron deficiency: Secondary | ICD-10-CM | POA: Diagnosis not present

## 2016-09-22 DIAGNOSIS — Z992 Dependence on renal dialysis: Secondary | ICD-10-CM | POA: Diagnosis not present

## 2016-09-22 DIAGNOSIS — N2581 Secondary hyperparathyroidism of renal origin: Secondary | ICD-10-CM | POA: Diagnosis not present

## 2016-09-22 DIAGNOSIS — N186 End stage renal disease: Secondary | ICD-10-CM | POA: Diagnosis not present

## 2016-09-24 DIAGNOSIS — N186 End stage renal disease: Secondary | ICD-10-CM | POA: Diagnosis not present

## 2016-09-24 DIAGNOSIS — N2581 Secondary hyperparathyroidism of renal origin: Secondary | ICD-10-CM | POA: Diagnosis not present

## 2016-09-24 DIAGNOSIS — Z992 Dependence on renal dialysis: Secondary | ICD-10-CM | POA: Diagnosis not present

## 2016-09-24 DIAGNOSIS — E611 Iron deficiency: Secondary | ICD-10-CM | POA: Diagnosis not present

## 2016-09-27 DIAGNOSIS — E611 Iron deficiency: Secondary | ICD-10-CM | POA: Diagnosis not present

## 2016-09-27 DIAGNOSIS — N186 End stage renal disease: Secondary | ICD-10-CM | POA: Diagnosis not present

## 2016-09-27 DIAGNOSIS — N2581 Secondary hyperparathyroidism of renal origin: Secondary | ICD-10-CM | POA: Diagnosis not present

## 2016-09-27 DIAGNOSIS — Z992 Dependence on renal dialysis: Secondary | ICD-10-CM | POA: Diagnosis not present

## 2016-09-29 DIAGNOSIS — N2581 Secondary hyperparathyroidism of renal origin: Secondary | ICD-10-CM | POA: Diagnosis not present

## 2016-09-29 DIAGNOSIS — N186 End stage renal disease: Secondary | ICD-10-CM | POA: Diagnosis not present

## 2016-09-29 DIAGNOSIS — Z992 Dependence on renal dialysis: Secondary | ICD-10-CM | POA: Diagnosis not present

## 2016-09-29 DIAGNOSIS — E611 Iron deficiency: Secondary | ICD-10-CM | POA: Diagnosis not present

## 2016-10-01 DIAGNOSIS — N2581 Secondary hyperparathyroidism of renal origin: Secondary | ICD-10-CM | POA: Diagnosis not present

## 2016-10-01 DIAGNOSIS — E611 Iron deficiency: Secondary | ICD-10-CM | POA: Diagnosis not present

## 2016-10-01 DIAGNOSIS — N186 End stage renal disease: Secondary | ICD-10-CM | POA: Diagnosis not present

## 2016-10-01 DIAGNOSIS — Z992 Dependence on renal dialysis: Secondary | ICD-10-CM | POA: Diagnosis not present

## 2016-10-04 DIAGNOSIS — Z992 Dependence on renal dialysis: Secondary | ICD-10-CM | POA: Diagnosis not present

## 2016-10-04 DIAGNOSIS — E611 Iron deficiency: Secondary | ICD-10-CM | POA: Diagnosis not present

## 2016-10-04 DIAGNOSIS — N2581 Secondary hyperparathyroidism of renal origin: Secondary | ICD-10-CM | POA: Diagnosis not present

## 2016-10-04 DIAGNOSIS — N186 End stage renal disease: Secondary | ICD-10-CM | POA: Diagnosis not present

## 2016-10-04 NOTE — Progress Notes (Signed)
Subjective:    Patient ID: [name] [name], male    DOB: [birth year], 52 y.o.   MRN: [id number] Chief Complaint  Patient presents with  . Re-evaluation    6 week follow up   Patient presents earlier than his normal six month exam at the request of his dialysis center. Dialysis center staff state a "whistling" sound from his fistula. The patient denies any issues with hemodialysis such as cannulation problems, increased bleeding, decrease in doppler flow or recirculation. The patient also denies any fistula skin breakdown, pain, edema, pallor or ulceration of the arm / hand. The patient underwent a left upper extremity hemodialysis access duplex which was notable for 2 hematomas located laterally. Patent left radiocephalic AV fistula. Patent left radial artery.    Review of Systems  Constitutional: Negative.   HENT: Negative.   Eyes: Negative.   Respiratory: Negative.   Cardiovascular: Negative.   Gastrointestinal: Negative.   Endocrine: Negative.   Genitourinary: Negative.   Musculoskeletal: Negative.   Skin: Negative.   Allergic/Immunologic: Negative.   Neurological: Negative.   Hematological: Negative.   Psychiatric/Behavioral: Negative.       Objective:   Physical Exam  Constitutional: He is oriented to person, place, and time. He appears well-developed and well-nourished. No distress.  HENT:  Head: Normocephalic and atraumatic.  Eyes: Conjunctivae are normal. Pupils are equal, round, and reactive to light.  Neck: Normal range of motion.  Cardiovascular: Normal rate, regular rhythm and normal heart sounds.   Pulses:      Radial pulses are 2+ on the right side, and 2+ on the left side.  Right radiocephalic AV fistula with good bruit and thrill. Unable to hear any whistling on exam.  Pulmonary/Chest: Effort normal.  Musculoskeletal: Normal range of motion. He exhibits no edema.  Neurological: He is alert and oriented to person, place, and time.  Skin: Skin is warm and dry. He  is not diaphoretic.  Psychiatric: He has a normal mood and affect. His behavior is normal. Judgment and thought content normal.  Vitals reviewed.   BP (!) 155/85 (BP Location: Right Arm)   Pulse (!) 106   Resp 16   Ht 6' (1.829 m)   Wt (!) 340 lb (154.2 kg)   BMI 46.11 kg/m   Past Medical History:  Diagnosis Date  . Aneurysm ([place])     Right arm fistula 3 aneurysms 2011,   plans to have a new procedure  . Angina    occasional, last 6 mo ago  . Chest discomfort     2008  . CHF (congestive heart failure) ([place])   . Dialysis patient [facility])    M-W-F @ [place]  . Dysrhythmia   . Ejection fraction     65%, echo, 2008   /   EF 55-60%, echo, October 28, 2010  . ESRD (end stage renal disease) ([place])    on hemodialysis T_T_S  . Hypertension   . Leg pain   . Orthostatic hypotension     2008  . Overweight(278.02)   . Sinus tachycardia     2008, TSH normal ... happens frequently with heart rate at 120-130  . Syncope     positional after dialysis... 2008    Social History   Social History  . Marital status: Married    Spouse name: N/A  . Number of children: N/A  . Years of education: N/A   Occupational History  . Not on file.   Social History Main Topics  .  Smoking status: Former Smoker    Types: Cigarettes    Quit date: 04/18/1994  . Smokeless tobacco: Current User    Types: Snuff     Comment: dips 1/2 snuff per day times 25 years  . Alcohol use No  . Drug use: No  . Sexual activity: Not on file   Other Topics Concern  . Not on file   Social History Narrative   Single    Past Surgical History:  Procedure Laterality Date  . A/V SHUNTOGRAM Left 06/30/2016   Procedure: A/V Fistulagram;  Surgeon: Algernon Huxley, MD;  Location: [place] [facility];  Service: Cardiovascular;  Laterality: Left;  . ARTERIOVENOUS GRAFT PLACEMENT    . AV FISTULA PLACEMENT    . AV FISTULA PLACEMENT Left 11/18/2015   Procedure: ARTERIOVENOUS (AV) FISTULA CREATION ( RADIOCEPHALIC );  Surgeon:  Algernon Huxley, MD;  Location: [facility];  Service: Vascular;  Laterality: Left;  . AV FISTULA PLACEMENT Left 03/23/2016   Procedure: ARTERIOVENOUS (AV) FISTULA CREATION ( REVISION );  Surgeon: Algernon Huxley, MD;  Location: [facility];  Service: Vascular;  Laterality: Left;  . [place] REMOVAL Left 03/23/2016   Procedure: REMOVAL OF ARTERIOVENOUS GORETEX GRAFT ([place]);  Surgeon: Algernon Huxley, MD;  Location: [facility];  Service: Vascular;  Laterality: Left;  . DIALYSIS/PERMA CATHETER REMOVAL N/A 08/01/2016   Procedure: Dialysis/Perma Catheter Removal;  Surgeon: Algernon Huxley, MD;  Location: [place] [facility];  Service: Cardiovascular;  Laterality: N/A;  . HERNIA REPAIR    . INSERTION OF DIALYSIS CATHETER  03/01/2011   Procedure: INSERTION OF DIALYSIS CATHETER;  Surgeon: Elam Dutch, MD;  Location: [place];  Service: Vascular;  Laterality: Left;  Exchange of Dialysis Catheter to 27cm 15Fr. Arrow Catheter  . PERIPHERAL VASCULAR CATHETERIZATION N/A 09/01/2014   Procedure: A/V Shuntogram/Fistulagram;  Surgeon: Algernon Huxley, MD;  Location: [place] [facility];  Service: Cardiovascular;  Laterality: N/A;  . PERIPHERAL VASCULAR CATHETERIZATION Right 09/01/2014   Procedure: Thrombectomy;  Surgeon: Algernon Huxley, MD;  Location: [place] [facility];  Service: Cardiovascular;  Laterality: Right;  . PERIPHERAL VASCULAR CATHETERIZATION Right 09/01/2014   Procedure: A/V Shunt Intervention;  Surgeon: Algernon Huxley, MD;  Location: [place] [facility];  Service: Cardiovascular;  Laterality: Right;  . PERIPHERAL VASCULAR CATHETERIZATION N/A 09/17/2014   Procedure: A/V Shuntogram/Fistulagram;  Surgeon: Algernon Huxley, MD;  Location: [place] [facility];  Service: Cardiovascular;  Laterality: N/A;  . PERIPHERAL VASCULAR CATHETERIZATION Right 10/17/2014   Procedure: Thrombectomy;  Surgeon: Katha Cabal, MD;  Location: [place] [facility];  Service: Cardiovascular;  Laterality: Right;  . PERIPHERAL VASCULAR CATHETERIZATION N/A 10/17/2014     Procedure: A/V Shuntogram/Fistulagram;  Surgeon: Katha Cabal, MD;  Location: [place] [facility];  Service: Cardiovascular;  Laterality: N/A;  . PERIPHERAL VASCULAR CATHETERIZATION N/A 10/17/2014   Procedure: A/V Shunt Intervention;  Surgeon: Katha Cabal, MD;  Location: [place] [facility];  Service: Cardiovascular;  Laterality: N/A;  . PERIPHERAL VASCULAR CATHETERIZATION Right 11/04/2014   Procedure: Thrombectomy;  Surgeon: Katha Cabal, MD;  Location: [place] [facility];  Service: Cardiovascular;  Laterality: Right;  . PERIPHERAL VASCULAR CATHETERIZATION Left 11/04/2014   Procedure: Visceral Venography;  Surgeon: Katha Cabal, MD;  Location: [place] [facility];  Service: Cardiovascular;  Laterality: Left;  . PERIPHERAL VASCULAR CATHETERIZATION Right 11/27/2014   Procedure: A/V Shuntogram/Fistulagram;  Surgeon: Algernon Huxley, MD;  Location: [place] [facility];  Service:  Cardiovascular;  Laterality: Right;  . PERIPHERAL VASCULAR CATHETERIZATION Right 11/27/2014   Procedure: A/V Shunt Intervention;  Surgeon: Algernon Huxley, MD;  Location: [place] [facility];  Service: Cardiovascular;  Laterality: Right;  . PERIPHERAL VASCULAR CATHETERIZATION Right 12/31/2014   Procedure: Thrombectomy;  Surgeon: Algernon Huxley, MD;  Location: [place] [facility];  Service: Cardiovascular;  Laterality: Right;  . PERIPHERAL VASCULAR CATHETERIZATION Right 01/12/2015   Procedure: A/V Shuntogram/Fistulagram;  Surgeon: Algernon Huxley, MD;  Location: [place] [facility];  Service: Cardiovascular;  Laterality: Right;  . PERIPHERAL VASCULAR CATHETERIZATION N/A 01/12/2015   Procedure: A/V Shunt Intervention;  Surgeon: Algernon Huxley, MD;  Location: [place] [facility];  Service: Cardiovascular;  Laterality: N/A;  . PERIPHERAL VASCULAR CATHETERIZATION Right 01/28/2015   Procedure: Thrombectomy;  Surgeon: Algernon Huxley, MD;  Location: [place] [facility];  Service: Cardiovascular;  Laterality: Right;  . PERIPHERAL  VASCULAR CATHETERIZATION N/A 06/08/2015   Procedure: A/V Shuntogram/Fistulagram;  Surgeon: Algernon Huxley, MD;  Location: [place] [facility];  Service: Cardiovascular;  Laterality: N/A;  . PERIPHERAL VASCULAR CATHETERIZATION N/A 06/08/2015   Procedure: A/V Shunt Intervention;  Surgeon: Algernon Huxley, MD;  Location: [place] [facility];  Service: Cardiovascular;  Laterality: N/A;  . PERIPHERAL VASCULAR CATHETERIZATION Right 07/06/2015   Procedure: A/V Shuntogram/Fistulagram;  Surgeon: Algernon Huxley, MD;  Location: [place] [facility];  Service: Cardiovascular;  Laterality: Right;  . PERIPHERAL VASCULAR CATHETERIZATION N/A 07/06/2015   Procedure: A/V Shunt Intervention;  Surgeon: Algernon Huxley, MD;  Location: [place] [facility];  Service: Cardiovascular;  Laterality: N/A;  . PERIPHERAL VASCULAR CATHETERIZATION N/A 08/04/2015   Procedure: graft declot;  Surgeon: Katha Cabal, MD;  Location: [place] [facility];  Service: Cardiovascular;  Laterality: N/A;  . PERIPHERAL VASCULAR CATHETERIZATION Right 08/25/2015   Procedure: A/V Shuntogram/Fistulagram;  Surgeon: Katha Cabal, MD;  Location: [place] [facility];  Service: Cardiovascular;  Laterality: Right;  . PERIPHERAL VASCULAR CATHETERIZATION N/A 11/04/2015   Procedure: Dialysis/Perma Catheter Insertion;  Surgeon: Algernon Huxley, MD;  Location: [place] [facility];  Service: Cardiovascular;  Laterality: N/A;  . PERIPHERAL VASCULAR CATHETERIZATION Left 12/14/2015   Procedure: A/V Shuntogram/Fistulagram;  Surgeon: Algernon Huxley, MD;  Location: [place] [facility];  Service: Cardiovascular;  Laterality: Left;  . PERIPHERAL VASCULAR CATHETERIZATION N/A 12/14/2015   Procedure: A/V Shunt Intervention;  Surgeon: Algernon Huxley, MD;  Location: [place] [facility];  Service: Cardiovascular;  Laterality: N/A;  . PERIPHERAL VASCULAR CATHETERIZATION N/A 12/17/2015   Procedure: Dialysis/Perma Catheter Insertion;  Surgeon: Algernon Huxley, MD;  Location: [place] [facility];   Service: Cardiovascular;  Laterality: N/A;  . PERIPHERAL VASCULAR CATHETERIZATION Left 12/22/2015   Procedure: Dialysis/Perma Catheter Insertion;  Surgeon: Katha Cabal, MD;  Location: [place] [facility];  Service: Cardiovascular;  Laterality: Left;  . PERIPHERAL VASCULAR CATHETERIZATION Left 02/29/2016   Procedure: A/V Shuntogram/Fistulagram;  Surgeon: Algernon Huxley, MD;  Location: [place] [facility];  Service: Cardiovascular;  Laterality: Left;  . PERIPHERAL VASCULAR CATHETERIZATION N/A 02/29/2016   Procedure: A/V Shunt Intervention;  Surgeon: Algernon Huxley, MD;  Location: [place] [facility];  Service: Cardiovascular;  Laterality: N/A;  . right arm graft     for dyalisis  . THROMBECTOMY    . THROMBECTOMY W/ EMBOLECTOMY  03/01/2011   Procedure: THROMBECTOMY ARTERIOVENOUS GORE-TEX GRAFT;  Surgeon: Elam Dutch, MD;  Location: [place];  Service: Vascular;  Laterality: Right;  Attempted Thrombectomy of Old  Right Upper Arm Arteriovenous gortex Graft. Insertion of new Arteriovenous Graft using 19mm x 50cm Gortex Stretch graft.   . THROMBECTOMY W/ EMBOLECTOMY  07/11/2011   Procedure: THROMBECTOMY ARTERIOVENOUS GORE-TEX GRAFT;  Surgeon: Rosetta Posner, MD;  Location: [place];  Service: Vascular;  Laterality: Right;  . UMBILICAL HERNIA REPAIR    . VENOGRAM N/A 08/23/2011   Procedure: VENOGRAM;  Surgeon: [name], MD;  Location: [facility] C[facility];  Service: Cardiovascular;  Laterality: N/A;    Family History  Problem Relation Age of Onset  . Hypertension Mother   . Heart disease Mother   . Hypertension Father   . Heart disease Father     No Known Allergies     Assessment & Plan:  Patient presents earlier than his normal six month exam at the request of his dialysis center. Dialysis center staff state a "whistling" sound from his fistula. The patient denies any issues with hemodialysis such as cannulation problems, increased bleeding, decrease in doppler flow or recirculation. The patient  also denies any fistula skin breakdown, pain, edema, pallor or ulceration of the arm / hand. The patient underwent a left upper extremity hemodialysis access duplex which was notable for 2 hematomas located laterally. Patent left radiocephalic AV fistula. Patent left radial artery.   1. ESRD (end stage renal disease) ([place]) - Stable Patient with patent radiocephalic AV fistula on duplex. Patient without complaint in regards to how he is running on dialysis. The two hematomas noted just lateral to the access may be contributing to some muffled sound the dialysis staff is hearing. I am unable to hear this found. At this time, there is no indication for any intervention. The patient is to follow up in 6 months as per his normal routine. The patient is to call sooner if he starts to experience any issues with dialysis.  - VAS US DUPLEX DIALYSIS ACCESS (AVF,AVG); Future   Current Outpatient Prescriptions on File Prior to Visit  Medication Sig Dispense Refill  . apixaban (ELIQUIS) 5 MG TABS tablet Take 5 mg by mouth 2 (two) times daily.    [name] aspirin EC 81 MG tablet Take 81 mg by mouth every morning.    . B Complex-C-Folic Acid (NEPHRO-VITE PO) Take 1 tablet by mouth daily.      . calcium acetate (PHOSLO) 667 MG capsule Take 2,001 mg by mouth See admin instructions. 1334 mg 3 times a day with meals and 667 mg with snacks    . cinacalcet (SENSIPAR) 90 MG tablet Take 90 mg by mouth daily.    [name] FOSRENOL 500 MG chewable tablet Chew 500 mg by mouth 3 (three) times daily with meals. Reported on 11/04/2015    . midodrine (PROAMATINE) 5 MG tablet Take 1 tablet by mouth See admin instructions. Pt may take before dialysis treatment, Tues, Thurs and Sat  4  . nitroGLYCERIN (NITROSTAT) 0.4 MG SL tablet Place 1 tablet (0.4 mg total) under the tongue every 5 (five) minutes as needed. For chest 45 tablet 3   No current facility-administered medications on file prior to visit.     There are no Patient  Instructions on file for this visit. No Follow-up on file.   Kierre Hintz A Manus Weedman, PA-C

## 2016-10-06 DIAGNOSIS — Z992 Dependence on renal dialysis: Secondary | ICD-10-CM | POA: Diagnosis not present

## 2016-10-06 DIAGNOSIS — N186 End stage renal disease: Secondary | ICD-10-CM | POA: Diagnosis not present

## 2016-10-06 DIAGNOSIS — E611 Iron deficiency: Secondary | ICD-10-CM | POA: Diagnosis not present

## 2016-10-06 DIAGNOSIS — N2581 Secondary hyperparathyroidism of renal origin: Secondary | ICD-10-CM | POA: Diagnosis not present

## 2016-10-08 DIAGNOSIS — N186 End stage renal disease: Secondary | ICD-10-CM | POA: Diagnosis not present

## 2016-10-08 DIAGNOSIS — N2581 Secondary hyperparathyroidism of renal origin: Secondary | ICD-10-CM | POA: Diagnosis not present

## 2016-10-08 DIAGNOSIS — E611 Iron deficiency: Secondary | ICD-10-CM | POA: Diagnosis not present

## 2016-10-08 DIAGNOSIS — Z992 Dependence on renal dialysis: Secondary | ICD-10-CM | POA: Diagnosis not present

## 2016-10-11 DIAGNOSIS — N2581 Secondary hyperparathyroidism of renal origin: Secondary | ICD-10-CM | POA: Diagnosis not present

## 2016-10-11 DIAGNOSIS — N186 End stage renal disease: Secondary | ICD-10-CM | POA: Diagnosis not present

## 2016-10-11 DIAGNOSIS — E611 Iron deficiency: Secondary | ICD-10-CM | POA: Diagnosis not present

## 2016-10-11 DIAGNOSIS — Z992 Dependence on renal dialysis: Secondary | ICD-10-CM | POA: Diagnosis not present

## 2016-10-13 DIAGNOSIS — Z992 Dependence on renal dialysis: Secondary | ICD-10-CM | POA: Diagnosis not present

## 2016-10-13 DIAGNOSIS — N186 End stage renal disease: Secondary | ICD-10-CM | POA: Diagnosis not present

## 2016-10-13 DIAGNOSIS — N2581 Secondary hyperparathyroidism of renal origin: Secondary | ICD-10-CM | POA: Diagnosis not present

## 2016-10-13 DIAGNOSIS — E611 Iron deficiency: Secondary | ICD-10-CM | POA: Diagnosis not present

## 2016-10-15 DIAGNOSIS — Z992 Dependence on renal dialysis: Secondary | ICD-10-CM | POA: Diagnosis not present

## 2016-10-15 DIAGNOSIS — N186 End stage renal disease: Secondary | ICD-10-CM | POA: Diagnosis not present

## 2016-10-15 DIAGNOSIS — N2581 Secondary hyperparathyroidism of renal origin: Secondary | ICD-10-CM | POA: Diagnosis not present

## 2016-10-15 DIAGNOSIS — E611 Iron deficiency: Secondary | ICD-10-CM | POA: Diagnosis not present

## 2016-10-18 DIAGNOSIS — N2581 Secondary hyperparathyroidism of renal origin: Secondary | ICD-10-CM | POA: Diagnosis not present

## 2016-10-18 DIAGNOSIS — Z992 Dependence on renal dialysis: Secondary | ICD-10-CM | POA: Diagnosis not present

## 2016-10-18 DIAGNOSIS — N186 End stage renal disease: Secondary | ICD-10-CM | POA: Diagnosis not present

## 2016-10-18 DIAGNOSIS — D509 Iron deficiency anemia, unspecified: Secondary | ICD-10-CM | POA: Diagnosis not present

## 2016-10-18 DIAGNOSIS — E611 Iron deficiency: Secondary | ICD-10-CM | POA: Diagnosis not present

## 2016-10-20 DIAGNOSIS — N2581 Secondary hyperparathyroidism of renal origin: Secondary | ICD-10-CM | POA: Diagnosis not present

## 2016-10-20 DIAGNOSIS — Z992 Dependence on renal dialysis: Secondary | ICD-10-CM | POA: Diagnosis not present

## 2016-10-20 DIAGNOSIS — D509 Iron deficiency anemia, unspecified: Secondary | ICD-10-CM | POA: Diagnosis not present

## 2016-10-20 DIAGNOSIS — E611 Iron deficiency: Secondary | ICD-10-CM | POA: Diagnosis not present

## 2016-10-20 DIAGNOSIS — N186 End stage renal disease: Secondary | ICD-10-CM | POA: Diagnosis not present

## 2016-10-22 DIAGNOSIS — D509 Iron deficiency anemia, unspecified: Secondary | ICD-10-CM | POA: Diagnosis not present

## 2016-10-22 DIAGNOSIS — Z992 Dependence on renal dialysis: Secondary | ICD-10-CM | POA: Diagnosis not present

## 2016-10-22 DIAGNOSIS — N2581 Secondary hyperparathyroidism of renal origin: Secondary | ICD-10-CM | POA: Diagnosis not present

## 2016-10-22 DIAGNOSIS — E611 Iron deficiency: Secondary | ICD-10-CM | POA: Diagnosis not present

## 2016-10-22 DIAGNOSIS — N186 End stage renal disease: Secondary | ICD-10-CM | POA: Diagnosis not present

## 2016-10-25 DIAGNOSIS — Z992 Dependence on renal dialysis: Secondary | ICD-10-CM | POA: Diagnosis not present

## 2016-10-25 DIAGNOSIS — N2581 Secondary hyperparathyroidism of renal origin: Secondary | ICD-10-CM | POA: Diagnosis not present

## 2016-10-25 DIAGNOSIS — N186 End stage renal disease: Secondary | ICD-10-CM | POA: Diagnosis not present

## 2016-10-25 DIAGNOSIS — E611 Iron deficiency: Secondary | ICD-10-CM | POA: Diagnosis not present

## 2016-10-25 DIAGNOSIS — D509 Iron deficiency anemia, unspecified: Secondary | ICD-10-CM | POA: Diagnosis not present

## 2016-10-27 DIAGNOSIS — N2581 Secondary hyperparathyroidism of renal origin: Secondary | ICD-10-CM | POA: Diagnosis not present

## 2016-10-27 DIAGNOSIS — Z992 Dependence on renal dialysis: Secondary | ICD-10-CM | POA: Diagnosis not present

## 2016-10-27 DIAGNOSIS — E611 Iron deficiency: Secondary | ICD-10-CM | POA: Diagnosis not present

## 2016-10-27 DIAGNOSIS — D509 Iron deficiency anemia, unspecified: Secondary | ICD-10-CM | POA: Diagnosis not present

## 2016-10-27 DIAGNOSIS — N186 End stage renal disease: Secondary | ICD-10-CM | POA: Diagnosis not present

## 2016-10-29 DIAGNOSIS — D509 Iron deficiency anemia, unspecified: Secondary | ICD-10-CM | POA: Diagnosis not present

## 2016-10-29 DIAGNOSIS — E611 Iron deficiency: Secondary | ICD-10-CM | POA: Diagnosis not present

## 2016-10-29 DIAGNOSIS — Z992 Dependence on renal dialysis: Secondary | ICD-10-CM | POA: Diagnosis not present

## 2016-10-29 DIAGNOSIS — N186 End stage renal disease: Secondary | ICD-10-CM | POA: Diagnosis not present

## 2016-10-29 DIAGNOSIS — N2581 Secondary hyperparathyroidism of renal origin: Secondary | ICD-10-CM | POA: Diagnosis not present

## 2016-11-01 DIAGNOSIS — N186 End stage renal disease: Secondary | ICD-10-CM | POA: Diagnosis not present

## 2016-11-01 DIAGNOSIS — N2581 Secondary hyperparathyroidism of renal origin: Secondary | ICD-10-CM | POA: Diagnosis not present

## 2016-11-01 DIAGNOSIS — E611 Iron deficiency: Secondary | ICD-10-CM | POA: Diagnosis not present

## 2016-11-01 DIAGNOSIS — D509 Iron deficiency anemia, unspecified: Secondary | ICD-10-CM | POA: Diagnosis not present

## 2016-11-01 DIAGNOSIS — Z992 Dependence on renal dialysis: Secondary | ICD-10-CM | POA: Diagnosis not present

## 2016-11-03 DIAGNOSIS — N186 End stage renal disease: Secondary | ICD-10-CM | POA: Diagnosis not present

## 2016-11-03 DIAGNOSIS — D509 Iron deficiency anemia, unspecified: Secondary | ICD-10-CM | POA: Diagnosis not present

## 2016-11-03 DIAGNOSIS — N2581 Secondary hyperparathyroidism of renal origin: Secondary | ICD-10-CM | POA: Diagnosis not present

## 2016-11-03 DIAGNOSIS — Z992 Dependence on renal dialysis: Secondary | ICD-10-CM | POA: Diagnosis not present

## 2016-11-03 DIAGNOSIS — E611 Iron deficiency: Secondary | ICD-10-CM | POA: Diagnosis not present

## 2016-11-05 DIAGNOSIS — N2581 Secondary hyperparathyroidism of renal origin: Secondary | ICD-10-CM | POA: Diagnosis not present

## 2016-11-05 DIAGNOSIS — E611 Iron deficiency: Secondary | ICD-10-CM | POA: Diagnosis not present

## 2016-11-05 DIAGNOSIS — D509 Iron deficiency anemia, unspecified: Secondary | ICD-10-CM | POA: Diagnosis not present

## 2016-11-05 DIAGNOSIS — N186 End stage renal disease: Secondary | ICD-10-CM | POA: Diagnosis not present

## 2016-11-05 DIAGNOSIS — Z992 Dependence on renal dialysis: Secondary | ICD-10-CM | POA: Diagnosis not present

## 2016-11-08 DIAGNOSIS — Z992 Dependence on renal dialysis: Secondary | ICD-10-CM | POA: Diagnosis not present

## 2016-11-08 DIAGNOSIS — E611 Iron deficiency: Secondary | ICD-10-CM | POA: Diagnosis not present

## 2016-11-08 DIAGNOSIS — N186 End stage renal disease: Secondary | ICD-10-CM | POA: Diagnosis not present

## 2016-11-08 DIAGNOSIS — N2581 Secondary hyperparathyroidism of renal origin: Secondary | ICD-10-CM | POA: Diagnosis not present

## 2016-11-08 DIAGNOSIS — D509 Iron deficiency anemia, unspecified: Secondary | ICD-10-CM | POA: Diagnosis not present

## 2016-11-10 DIAGNOSIS — E611 Iron deficiency: Secondary | ICD-10-CM | POA: Diagnosis not present

## 2016-11-10 DIAGNOSIS — N186 End stage renal disease: Secondary | ICD-10-CM | POA: Diagnosis not present

## 2016-11-10 DIAGNOSIS — N2581 Secondary hyperparathyroidism of renal origin: Secondary | ICD-10-CM | POA: Diagnosis not present

## 2016-11-10 DIAGNOSIS — D509 Iron deficiency anemia, unspecified: Secondary | ICD-10-CM | POA: Diagnosis not present

## 2016-11-10 DIAGNOSIS — Z992 Dependence on renal dialysis: Secondary | ICD-10-CM | POA: Diagnosis not present

## 2016-11-12 DIAGNOSIS — Z992 Dependence on renal dialysis: Secondary | ICD-10-CM | POA: Diagnosis not present

## 2016-11-12 DIAGNOSIS — D509 Iron deficiency anemia, unspecified: Secondary | ICD-10-CM | POA: Diagnosis not present

## 2016-11-12 DIAGNOSIS — N186 End stage renal disease: Secondary | ICD-10-CM | POA: Diagnosis not present

## 2016-11-12 DIAGNOSIS — N2581 Secondary hyperparathyroidism of renal origin: Secondary | ICD-10-CM | POA: Diagnosis not present

## 2016-11-12 DIAGNOSIS — E611 Iron deficiency: Secondary | ICD-10-CM | POA: Diagnosis not present

## 2016-11-15 DIAGNOSIS — N186 End stage renal disease: Secondary | ICD-10-CM | POA: Diagnosis not present

## 2016-11-15 DIAGNOSIS — D509 Iron deficiency anemia, unspecified: Secondary | ICD-10-CM | POA: Diagnosis not present

## 2016-11-15 DIAGNOSIS — E611 Iron deficiency: Secondary | ICD-10-CM | POA: Diagnosis not present

## 2016-11-15 DIAGNOSIS — N2581 Secondary hyperparathyroidism of renal origin: Secondary | ICD-10-CM | POA: Diagnosis not present

## 2016-11-15 DIAGNOSIS — Z992 Dependence on renal dialysis: Secondary | ICD-10-CM | POA: Diagnosis not present

## 2016-11-17 DIAGNOSIS — E611 Iron deficiency: Secondary | ICD-10-CM | POA: Diagnosis not present

## 2016-11-17 DIAGNOSIS — Z992 Dependence on renal dialysis: Secondary | ICD-10-CM | POA: Diagnosis not present

## 2016-11-17 DIAGNOSIS — N186 End stage renal disease: Secondary | ICD-10-CM | POA: Diagnosis not present

## 2016-11-17 DIAGNOSIS — N2581 Secondary hyperparathyroidism of renal origin: Secondary | ICD-10-CM | POA: Diagnosis not present

## 2016-11-19 DIAGNOSIS — N2581 Secondary hyperparathyroidism of renal origin: Secondary | ICD-10-CM | POA: Diagnosis not present

## 2016-11-19 DIAGNOSIS — Z992 Dependence on renal dialysis: Secondary | ICD-10-CM | POA: Diagnosis not present

## 2016-11-19 DIAGNOSIS — N186 End stage renal disease: Secondary | ICD-10-CM | POA: Diagnosis not present

## 2016-11-19 DIAGNOSIS — E611 Iron deficiency: Secondary | ICD-10-CM | POA: Diagnosis not present

## 2016-11-22 DIAGNOSIS — N2581 Secondary hyperparathyroidism of renal origin: Secondary | ICD-10-CM | POA: Diagnosis not present

## 2016-11-22 DIAGNOSIS — Z992 Dependence on renal dialysis: Secondary | ICD-10-CM | POA: Diagnosis not present

## 2016-11-22 DIAGNOSIS — N186 End stage renal disease: Secondary | ICD-10-CM | POA: Diagnosis not present

## 2016-11-22 DIAGNOSIS — E611 Iron deficiency: Secondary | ICD-10-CM | POA: Diagnosis not present

## 2016-11-24 DIAGNOSIS — E611 Iron deficiency: Secondary | ICD-10-CM | POA: Diagnosis not present

## 2016-11-24 DIAGNOSIS — Z992 Dependence on renal dialysis: Secondary | ICD-10-CM | POA: Diagnosis not present

## 2016-11-24 DIAGNOSIS — N2581 Secondary hyperparathyroidism of renal origin: Secondary | ICD-10-CM | POA: Diagnosis not present

## 2016-11-24 DIAGNOSIS — N186 End stage renal disease: Secondary | ICD-10-CM | POA: Diagnosis not present

## 2016-11-26 DIAGNOSIS — N186 End stage renal disease: Secondary | ICD-10-CM | POA: Diagnosis not present

## 2016-11-26 DIAGNOSIS — Z992 Dependence on renal dialysis: Secondary | ICD-10-CM | POA: Diagnosis not present

## 2016-11-26 DIAGNOSIS — N2581 Secondary hyperparathyroidism of renal origin: Secondary | ICD-10-CM | POA: Diagnosis not present

## 2016-11-26 DIAGNOSIS — E611 Iron deficiency: Secondary | ICD-10-CM | POA: Diagnosis not present

## 2016-11-29 DIAGNOSIS — Z992 Dependence on renal dialysis: Secondary | ICD-10-CM | POA: Diagnosis not present

## 2016-11-29 DIAGNOSIS — N186 End stage renal disease: Secondary | ICD-10-CM | POA: Diagnosis not present

## 2016-11-29 DIAGNOSIS — N2581 Secondary hyperparathyroidism of renal origin: Secondary | ICD-10-CM | POA: Diagnosis not present

## 2016-11-29 DIAGNOSIS — E611 Iron deficiency: Secondary | ICD-10-CM | POA: Diagnosis not present

## 2016-12-01 DIAGNOSIS — E611 Iron deficiency: Secondary | ICD-10-CM | POA: Diagnosis not present

## 2016-12-01 DIAGNOSIS — N186 End stage renal disease: Secondary | ICD-10-CM | POA: Diagnosis not present

## 2016-12-01 DIAGNOSIS — Z992 Dependence on renal dialysis: Secondary | ICD-10-CM | POA: Diagnosis not present

## 2016-12-01 DIAGNOSIS — N2581 Secondary hyperparathyroidism of renal origin: Secondary | ICD-10-CM | POA: Diagnosis not present

## 2016-12-03 DIAGNOSIS — N2581 Secondary hyperparathyroidism of renal origin: Secondary | ICD-10-CM | POA: Diagnosis not present

## 2016-12-03 DIAGNOSIS — E611 Iron deficiency: Secondary | ICD-10-CM | POA: Diagnosis not present

## 2016-12-03 DIAGNOSIS — Z992 Dependence on renal dialysis: Secondary | ICD-10-CM | POA: Diagnosis not present

## 2016-12-03 DIAGNOSIS — N186 End stage renal disease: Secondary | ICD-10-CM | POA: Diagnosis not present

## 2016-12-06 DIAGNOSIS — E611 Iron deficiency: Secondary | ICD-10-CM | POA: Diagnosis not present

## 2016-12-06 DIAGNOSIS — N2581 Secondary hyperparathyroidism of renal origin: Secondary | ICD-10-CM | POA: Diagnosis not present

## 2016-12-06 DIAGNOSIS — N186 End stage renal disease: Secondary | ICD-10-CM | POA: Diagnosis not present

## 2016-12-06 DIAGNOSIS — Z992 Dependence on renal dialysis: Secondary | ICD-10-CM | POA: Diagnosis not present

## 2016-12-08 DIAGNOSIS — Z992 Dependence on renal dialysis: Secondary | ICD-10-CM | POA: Diagnosis not present

## 2016-12-08 DIAGNOSIS — E611 Iron deficiency: Secondary | ICD-10-CM | POA: Diagnosis not present

## 2016-12-08 DIAGNOSIS — N186 End stage renal disease: Secondary | ICD-10-CM | POA: Diagnosis not present

## 2016-12-08 DIAGNOSIS — N2581 Secondary hyperparathyroidism of renal origin: Secondary | ICD-10-CM | POA: Diagnosis not present

## 2016-12-10 DIAGNOSIS — N186 End stage renal disease: Secondary | ICD-10-CM | POA: Diagnosis not present

## 2016-12-10 DIAGNOSIS — Z992 Dependence on renal dialysis: Secondary | ICD-10-CM | POA: Diagnosis not present

## 2016-12-10 DIAGNOSIS — E611 Iron deficiency: Secondary | ICD-10-CM | POA: Diagnosis not present

## 2016-12-10 DIAGNOSIS — N2581 Secondary hyperparathyroidism of renal origin: Secondary | ICD-10-CM | POA: Diagnosis not present

## 2016-12-13 DIAGNOSIS — Z992 Dependence on renal dialysis: Secondary | ICD-10-CM | POA: Diagnosis not present

## 2016-12-13 DIAGNOSIS — E611 Iron deficiency: Secondary | ICD-10-CM | POA: Diagnosis not present

## 2016-12-13 DIAGNOSIS — N186 End stage renal disease: Secondary | ICD-10-CM | POA: Diagnosis not present

## 2016-12-13 DIAGNOSIS — N2581 Secondary hyperparathyroidism of renal origin: Secondary | ICD-10-CM | POA: Diagnosis not present

## 2016-12-15 DIAGNOSIS — Z992 Dependence on renal dialysis: Secondary | ICD-10-CM | POA: Diagnosis not present

## 2016-12-15 DIAGNOSIS — E611 Iron deficiency: Secondary | ICD-10-CM | POA: Diagnosis not present

## 2016-12-15 DIAGNOSIS — N186 End stage renal disease: Secondary | ICD-10-CM | POA: Diagnosis not present

## 2016-12-15 DIAGNOSIS — N2581 Secondary hyperparathyroidism of renal origin: Secondary | ICD-10-CM | POA: Diagnosis not present

## 2016-12-16 DIAGNOSIS — N186 End stage renal disease: Secondary | ICD-10-CM | POA: Diagnosis not present

## 2016-12-16 DIAGNOSIS — Z992 Dependence on renal dialysis: Secondary | ICD-10-CM | POA: Diagnosis not present

## 2016-12-17 DIAGNOSIS — Z992 Dependence on renal dialysis: Secondary | ICD-10-CM | POA: Diagnosis not present

## 2016-12-17 DIAGNOSIS — E611 Iron deficiency: Secondary | ICD-10-CM | POA: Diagnosis not present

## 2016-12-17 DIAGNOSIS — N2581 Secondary hyperparathyroidism of renal origin: Secondary | ICD-10-CM | POA: Diagnosis not present

## 2016-12-17 DIAGNOSIS — N186 End stage renal disease: Secondary | ICD-10-CM | POA: Diagnosis not present

## 2016-12-20 DIAGNOSIS — Z992 Dependence on renal dialysis: Secondary | ICD-10-CM | POA: Diagnosis not present

## 2016-12-20 DIAGNOSIS — E611 Iron deficiency: Secondary | ICD-10-CM | POA: Diagnosis not present

## 2016-12-20 DIAGNOSIS — N186 End stage renal disease: Secondary | ICD-10-CM | POA: Diagnosis not present

## 2016-12-20 DIAGNOSIS — N2581 Secondary hyperparathyroidism of renal origin: Secondary | ICD-10-CM | POA: Diagnosis not present

## 2016-12-22 DIAGNOSIS — N2581 Secondary hyperparathyroidism of renal origin: Secondary | ICD-10-CM | POA: Diagnosis not present

## 2016-12-22 DIAGNOSIS — Z992 Dependence on renal dialysis: Secondary | ICD-10-CM | POA: Diagnosis not present

## 2016-12-22 DIAGNOSIS — E611 Iron deficiency: Secondary | ICD-10-CM | POA: Diagnosis not present

## 2016-12-22 DIAGNOSIS — N186 End stage renal disease: Secondary | ICD-10-CM | POA: Diagnosis not present

## 2016-12-24 DIAGNOSIS — N2581 Secondary hyperparathyroidism of renal origin: Secondary | ICD-10-CM | POA: Diagnosis not present

## 2016-12-24 DIAGNOSIS — N186 End stage renal disease: Secondary | ICD-10-CM | POA: Diagnosis not present

## 2016-12-24 DIAGNOSIS — E611 Iron deficiency: Secondary | ICD-10-CM | POA: Diagnosis not present

## 2016-12-24 DIAGNOSIS — Z992 Dependence on renal dialysis: Secondary | ICD-10-CM | POA: Diagnosis not present

## 2016-12-27 DIAGNOSIS — E611 Iron deficiency: Secondary | ICD-10-CM | POA: Diagnosis not present

## 2016-12-27 DIAGNOSIS — N186 End stage renal disease: Secondary | ICD-10-CM | POA: Diagnosis not present

## 2016-12-27 DIAGNOSIS — N2581 Secondary hyperparathyroidism of renal origin: Secondary | ICD-10-CM | POA: Diagnosis not present

## 2016-12-27 DIAGNOSIS — Z992 Dependence on renal dialysis: Secondary | ICD-10-CM | POA: Diagnosis not present

## 2016-12-29 DIAGNOSIS — N2581 Secondary hyperparathyroidism of renal origin: Secondary | ICD-10-CM | POA: Diagnosis not present

## 2016-12-29 DIAGNOSIS — N186 End stage renal disease: Secondary | ICD-10-CM | POA: Diagnosis not present

## 2016-12-29 DIAGNOSIS — E611 Iron deficiency: Secondary | ICD-10-CM | POA: Diagnosis not present

## 2016-12-29 DIAGNOSIS — Z992 Dependence on renal dialysis: Secondary | ICD-10-CM | POA: Diagnosis not present

## 2016-12-31 DIAGNOSIS — N2581 Secondary hyperparathyroidism of renal origin: Secondary | ICD-10-CM | POA: Diagnosis not present

## 2016-12-31 DIAGNOSIS — Z992 Dependence on renal dialysis: Secondary | ICD-10-CM | POA: Diagnosis not present

## 2016-12-31 DIAGNOSIS — N186 End stage renal disease: Secondary | ICD-10-CM | POA: Diagnosis not present

## 2016-12-31 DIAGNOSIS — E611 Iron deficiency: Secondary | ICD-10-CM | POA: Diagnosis not present

## 2017-01-03 DIAGNOSIS — E611 Iron deficiency: Secondary | ICD-10-CM | POA: Diagnosis not present

## 2017-01-03 DIAGNOSIS — N2581 Secondary hyperparathyroidism of renal origin: Secondary | ICD-10-CM | POA: Diagnosis not present

## 2017-01-03 DIAGNOSIS — N186 End stage renal disease: Secondary | ICD-10-CM | POA: Diagnosis not present

## 2017-01-03 DIAGNOSIS — Z992 Dependence on renal dialysis: Secondary | ICD-10-CM | POA: Diagnosis not present

## 2017-01-05 DIAGNOSIS — Z992 Dependence on renal dialysis: Secondary | ICD-10-CM | POA: Diagnosis not present

## 2017-01-05 DIAGNOSIS — N2581 Secondary hyperparathyroidism of renal origin: Secondary | ICD-10-CM | POA: Diagnosis not present

## 2017-01-05 DIAGNOSIS — E611 Iron deficiency: Secondary | ICD-10-CM | POA: Diagnosis not present

## 2017-01-05 DIAGNOSIS — N186 End stage renal disease: Secondary | ICD-10-CM | POA: Diagnosis not present

## 2017-01-07 DIAGNOSIS — Z992 Dependence on renal dialysis: Secondary | ICD-10-CM | POA: Diagnosis not present

## 2017-01-07 DIAGNOSIS — N2581 Secondary hyperparathyroidism of renal origin: Secondary | ICD-10-CM | POA: Diagnosis not present

## 2017-01-07 DIAGNOSIS — E611 Iron deficiency: Secondary | ICD-10-CM | POA: Diagnosis not present

## 2017-01-07 DIAGNOSIS — N186 End stage renal disease: Secondary | ICD-10-CM | POA: Diagnosis not present

## 2017-01-10 DIAGNOSIS — N186 End stage renal disease: Secondary | ICD-10-CM | POA: Diagnosis not present

## 2017-01-10 DIAGNOSIS — N2581 Secondary hyperparathyroidism of renal origin: Secondary | ICD-10-CM | POA: Diagnosis not present

## 2017-01-10 DIAGNOSIS — Z992 Dependence on renal dialysis: Secondary | ICD-10-CM | POA: Diagnosis not present

## 2017-01-10 DIAGNOSIS — E611 Iron deficiency: Secondary | ICD-10-CM | POA: Diagnosis not present

## 2017-01-12 DIAGNOSIS — N2581 Secondary hyperparathyroidism of renal origin: Secondary | ICD-10-CM | POA: Diagnosis not present

## 2017-01-12 DIAGNOSIS — Z992 Dependence on renal dialysis: Secondary | ICD-10-CM | POA: Diagnosis not present

## 2017-01-12 DIAGNOSIS — E611 Iron deficiency: Secondary | ICD-10-CM | POA: Diagnosis not present

## 2017-01-12 DIAGNOSIS — N186 End stage renal disease: Secondary | ICD-10-CM | POA: Diagnosis not present

## 2017-01-14 DIAGNOSIS — Z992 Dependence on renal dialysis: Secondary | ICD-10-CM | POA: Diagnosis not present

## 2017-01-14 DIAGNOSIS — N186 End stage renal disease: Secondary | ICD-10-CM | POA: Diagnosis not present

## 2017-01-14 DIAGNOSIS — N2581 Secondary hyperparathyroidism of renal origin: Secondary | ICD-10-CM | POA: Diagnosis not present

## 2017-01-14 DIAGNOSIS — E611 Iron deficiency: Secondary | ICD-10-CM | POA: Diagnosis not present

## 2017-01-15 DIAGNOSIS — Z992 Dependence on renal dialysis: Secondary | ICD-10-CM | POA: Diagnosis not present

## 2017-01-15 DIAGNOSIS — N186 End stage renal disease: Secondary | ICD-10-CM | POA: Diagnosis not present

## 2017-01-17 DIAGNOSIS — Z992 Dependence on renal dialysis: Secondary | ICD-10-CM | POA: Diagnosis not present

## 2017-01-17 DIAGNOSIS — D509 Iron deficiency anemia, unspecified: Secondary | ICD-10-CM | POA: Diagnosis not present

## 2017-01-17 DIAGNOSIS — Z23 Encounter for immunization: Secondary | ICD-10-CM | POA: Diagnosis not present

## 2017-01-17 DIAGNOSIS — N2581 Secondary hyperparathyroidism of renal origin: Secondary | ICD-10-CM | POA: Diagnosis not present

## 2017-01-17 DIAGNOSIS — N186 End stage renal disease: Secondary | ICD-10-CM | POA: Diagnosis not present

## 2017-01-19 DIAGNOSIS — N186 End stage renal disease: Secondary | ICD-10-CM | POA: Diagnosis not present

## 2017-01-19 DIAGNOSIS — Z23 Encounter for immunization: Secondary | ICD-10-CM | POA: Diagnosis not present

## 2017-01-19 DIAGNOSIS — D509 Iron deficiency anemia, unspecified: Secondary | ICD-10-CM | POA: Diagnosis not present

## 2017-01-19 DIAGNOSIS — N2581 Secondary hyperparathyroidism of renal origin: Secondary | ICD-10-CM | POA: Diagnosis not present

## 2017-01-19 DIAGNOSIS — Z992 Dependence on renal dialysis: Secondary | ICD-10-CM | POA: Diagnosis not present

## 2017-01-21 DIAGNOSIS — N186 End stage renal disease: Secondary | ICD-10-CM | POA: Diagnosis not present

## 2017-01-21 DIAGNOSIS — D509 Iron deficiency anemia, unspecified: Secondary | ICD-10-CM | POA: Diagnosis not present

## 2017-01-21 DIAGNOSIS — N2581 Secondary hyperparathyroidism of renal origin: Secondary | ICD-10-CM | POA: Diagnosis not present

## 2017-01-21 DIAGNOSIS — Z23 Encounter for immunization: Secondary | ICD-10-CM | POA: Diagnosis not present

## 2017-01-21 DIAGNOSIS — Z992 Dependence on renal dialysis: Secondary | ICD-10-CM | POA: Diagnosis not present

## 2017-01-24 DIAGNOSIS — N2581 Secondary hyperparathyroidism of renal origin: Secondary | ICD-10-CM | POA: Diagnosis not present

## 2017-01-24 DIAGNOSIS — Z23 Encounter for immunization: Secondary | ICD-10-CM | POA: Diagnosis not present

## 2017-01-24 DIAGNOSIS — D509 Iron deficiency anemia, unspecified: Secondary | ICD-10-CM | POA: Diagnosis not present

## 2017-01-24 DIAGNOSIS — Z992 Dependence on renal dialysis: Secondary | ICD-10-CM | POA: Diagnosis not present

## 2017-01-24 DIAGNOSIS — N186 End stage renal disease: Secondary | ICD-10-CM | POA: Diagnosis not present

## 2017-01-26 DIAGNOSIS — Z992 Dependence on renal dialysis: Secondary | ICD-10-CM | POA: Diagnosis not present

## 2017-01-26 DIAGNOSIS — D509 Iron deficiency anemia, unspecified: Secondary | ICD-10-CM | POA: Diagnosis not present

## 2017-01-26 DIAGNOSIS — N186 End stage renal disease: Secondary | ICD-10-CM | POA: Diagnosis not present

## 2017-01-26 DIAGNOSIS — Z23 Encounter for immunization: Secondary | ICD-10-CM | POA: Diagnosis not present

## 2017-01-26 DIAGNOSIS — N2581 Secondary hyperparathyroidism of renal origin: Secondary | ICD-10-CM | POA: Diagnosis not present

## 2017-01-29 DIAGNOSIS — Z992 Dependence on renal dialysis: Secondary | ICD-10-CM | POA: Diagnosis not present

## 2017-01-29 DIAGNOSIS — N2581 Secondary hyperparathyroidism of renal origin: Secondary | ICD-10-CM | POA: Diagnosis not present

## 2017-01-29 DIAGNOSIS — D509 Iron deficiency anemia, unspecified: Secondary | ICD-10-CM | POA: Diagnosis not present

## 2017-01-29 DIAGNOSIS — N186 End stage renal disease: Secondary | ICD-10-CM | POA: Diagnosis not present

## 2017-01-29 DIAGNOSIS — Z23 Encounter for immunization: Secondary | ICD-10-CM | POA: Diagnosis not present

## 2017-01-31 DIAGNOSIS — Z23 Encounter for immunization: Secondary | ICD-10-CM | POA: Diagnosis not present

## 2017-01-31 DIAGNOSIS — Z992 Dependence on renal dialysis: Secondary | ICD-10-CM | POA: Diagnosis not present

## 2017-01-31 DIAGNOSIS — D509 Iron deficiency anemia, unspecified: Secondary | ICD-10-CM | POA: Diagnosis not present

## 2017-01-31 DIAGNOSIS — N186 End stage renal disease: Secondary | ICD-10-CM | POA: Diagnosis not present

## 2017-01-31 DIAGNOSIS — N2581 Secondary hyperparathyroidism of renal origin: Secondary | ICD-10-CM | POA: Diagnosis not present

## 2017-02-02 DIAGNOSIS — Z992 Dependence on renal dialysis: Secondary | ICD-10-CM | POA: Diagnosis not present

## 2017-02-02 DIAGNOSIS — Z23 Encounter for immunization: Secondary | ICD-10-CM | POA: Diagnosis not present

## 2017-02-02 DIAGNOSIS — N2581 Secondary hyperparathyroidism of renal origin: Secondary | ICD-10-CM | POA: Diagnosis not present

## 2017-02-02 DIAGNOSIS — N186 End stage renal disease: Secondary | ICD-10-CM | POA: Diagnosis not present

## 2017-02-02 DIAGNOSIS — D509 Iron deficiency anemia, unspecified: Secondary | ICD-10-CM | POA: Diagnosis not present

## 2017-02-04 DIAGNOSIS — N186 End stage renal disease: Secondary | ICD-10-CM | POA: Diagnosis not present

## 2017-02-04 DIAGNOSIS — Z992 Dependence on renal dialysis: Secondary | ICD-10-CM | POA: Diagnosis not present

## 2017-02-04 DIAGNOSIS — N2581 Secondary hyperparathyroidism of renal origin: Secondary | ICD-10-CM | POA: Diagnosis not present

## 2017-02-04 DIAGNOSIS — D509 Iron deficiency anemia, unspecified: Secondary | ICD-10-CM | POA: Diagnosis not present

## 2017-02-04 DIAGNOSIS — Z23 Encounter for immunization: Secondary | ICD-10-CM | POA: Diagnosis not present

## 2017-02-07 DIAGNOSIS — Z23 Encounter for immunization: Secondary | ICD-10-CM | POA: Diagnosis not present

## 2017-02-07 DIAGNOSIS — Z992 Dependence on renal dialysis: Secondary | ICD-10-CM | POA: Diagnosis not present

## 2017-02-07 DIAGNOSIS — N186 End stage renal disease: Secondary | ICD-10-CM | POA: Diagnosis not present

## 2017-02-07 DIAGNOSIS — N2581 Secondary hyperparathyroidism of renal origin: Secondary | ICD-10-CM | POA: Diagnosis not present

## 2017-02-07 DIAGNOSIS — D509 Iron deficiency anemia, unspecified: Secondary | ICD-10-CM | POA: Diagnosis not present

## 2017-02-09 DIAGNOSIS — Z992 Dependence on renal dialysis: Secondary | ICD-10-CM | POA: Diagnosis not present

## 2017-02-09 DIAGNOSIS — N2581 Secondary hyperparathyroidism of renal origin: Secondary | ICD-10-CM | POA: Diagnosis not present

## 2017-02-09 DIAGNOSIS — Z23 Encounter for immunization: Secondary | ICD-10-CM | POA: Diagnosis not present

## 2017-02-09 DIAGNOSIS — N186 End stage renal disease: Secondary | ICD-10-CM | POA: Diagnosis not present

## 2017-02-09 DIAGNOSIS — D509 Iron deficiency anemia, unspecified: Secondary | ICD-10-CM | POA: Diagnosis not present

## 2017-02-11 DIAGNOSIS — Z23 Encounter for immunization: Secondary | ICD-10-CM | POA: Diagnosis not present

## 2017-02-11 DIAGNOSIS — N186 End stage renal disease: Secondary | ICD-10-CM | POA: Diagnosis not present

## 2017-02-11 DIAGNOSIS — Z992 Dependence on renal dialysis: Secondary | ICD-10-CM | POA: Diagnosis not present

## 2017-02-11 DIAGNOSIS — D509 Iron deficiency anemia, unspecified: Secondary | ICD-10-CM | POA: Diagnosis not present

## 2017-02-11 DIAGNOSIS — N2581 Secondary hyperparathyroidism of renal origin: Secondary | ICD-10-CM | POA: Diagnosis not present

## 2017-02-14 DIAGNOSIS — D509 Iron deficiency anemia, unspecified: Secondary | ICD-10-CM | POA: Diagnosis not present

## 2017-02-14 DIAGNOSIS — Z992 Dependence on renal dialysis: Secondary | ICD-10-CM | POA: Diagnosis not present

## 2017-02-14 DIAGNOSIS — Z23 Encounter for immunization: Secondary | ICD-10-CM | POA: Diagnosis not present

## 2017-02-14 DIAGNOSIS — N186 End stage renal disease: Secondary | ICD-10-CM | POA: Diagnosis not present

## 2017-02-14 DIAGNOSIS — N2581 Secondary hyperparathyroidism of renal origin: Secondary | ICD-10-CM | POA: Diagnosis not present

## 2017-02-15 DIAGNOSIS — Z992 Dependence on renal dialysis: Secondary | ICD-10-CM | POA: Diagnosis not present

## 2017-02-15 DIAGNOSIS — N186 End stage renal disease: Secondary | ICD-10-CM | POA: Diagnosis not present

## 2017-02-16 DIAGNOSIS — Z992 Dependence on renal dialysis: Secondary | ICD-10-CM | POA: Diagnosis not present

## 2017-02-16 DIAGNOSIS — N186 End stage renal disease: Secondary | ICD-10-CM | POA: Diagnosis not present

## 2017-02-16 DIAGNOSIS — N2581 Secondary hyperparathyroidism of renal origin: Secondary | ICD-10-CM | POA: Diagnosis not present

## 2017-02-16 DIAGNOSIS — E611 Iron deficiency: Secondary | ICD-10-CM | POA: Diagnosis not present

## 2017-02-18 DIAGNOSIS — E611 Iron deficiency: Secondary | ICD-10-CM | POA: Diagnosis not present

## 2017-02-18 DIAGNOSIS — N2581 Secondary hyperparathyroidism of renal origin: Secondary | ICD-10-CM | POA: Diagnosis not present

## 2017-02-18 DIAGNOSIS — Z992 Dependence on renal dialysis: Secondary | ICD-10-CM | POA: Diagnosis not present

## 2017-02-18 DIAGNOSIS — N186 End stage renal disease: Secondary | ICD-10-CM | POA: Diagnosis not present

## 2017-02-21 DIAGNOSIS — N2581 Secondary hyperparathyroidism of renal origin: Secondary | ICD-10-CM | POA: Diagnosis not present

## 2017-02-21 DIAGNOSIS — N186 End stage renal disease: Secondary | ICD-10-CM | POA: Diagnosis not present

## 2017-02-21 DIAGNOSIS — Z992 Dependence on renal dialysis: Secondary | ICD-10-CM | POA: Diagnosis not present

## 2017-02-21 DIAGNOSIS — E611 Iron deficiency: Secondary | ICD-10-CM | POA: Diagnosis not present

## 2017-02-23 ENCOUNTER — Other Ambulatory Visit (INDEPENDENT_AMBULATORY_CARE_PROVIDER_SITE_OTHER): Payer: Self-pay

## 2017-02-23 DIAGNOSIS — Z992 Dependence on renal dialysis: Secondary | ICD-10-CM | POA: Diagnosis not present

## 2017-02-23 DIAGNOSIS — E611 Iron deficiency: Secondary | ICD-10-CM | POA: Diagnosis not present

## 2017-02-23 DIAGNOSIS — N186 End stage renal disease: Secondary | ICD-10-CM | POA: Diagnosis not present

## 2017-02-23 DIAGNOSIS — N2581 Secondary hyperparathyroidism of renal origin: Secondary | ICD-10-CM | POA: Diagnosis not present

## 2017-02-23 MED ORDER — APIXABAN 5 MG PO TABS: 5.0000 mg | ORAL_TABLET | Freq: Two times a day (BID) | ORAL | 3 refills | Status: DC

## 2017-02-25 DIAGNOSIS — N2581 Secondary hyperparathyroidism of renal origin: Secondary | ICD-10-CM | POA: Diagnosis not present

## 2017-02-25 DIAGNOSIS — E611 Iron deficiency: Secondary | ICD-10-CM | POA: Diagnosis not present

## 2017-02-25 DIAGNOSIS — Z992 Dependence on renal dialysis: Secondary | ICD-10-CM | POA: Diagnosis not present

## 2017-02-25 DIAGNOSIS — N186 End stage renal disease: Secondary | ICD-10-CM | POA: Diagnosis not present

## 2017-02-28 DIAGNOSIS — N186 End stage renal disease: Secondary | ICD-10-CM | POA: Diagnosis not present

## 2017-02-28 DIAGNOSIS — N2581 Secondary hyperparathyroidism of renal origin: Secondary | ICD-10-CM | POA: Diagnosis not present

## 2017-02-28 DIAGNOSIS — E611 Iron deficiency: Secondary | ICD-10-CM | POA: Diagnosis not present

## 2017-02-28 DIAGNOSIS — Z992 Dependence on renal dialysis: Secondary | ICD-10-CM | POA: Diagnosis not present

## 2017-03-02 DIAGNOSIS — N186 End stage renal disease: Secondary | ICD-10-CM | POA: Diagnosis not present

## 2017-03-02 DIAGNOSIS — Z992 Dependence on renal dialysis: Secondary | ICD-10-CM | POA: Diagnosis not present

## 2017-03-02 DIAGNOSIS — E611 Iron deficiency: Secondary | ICD-10-CM | POA: Diagnosis not present

## 2017-03-02 DIAGNOSIS — N2581 Secondary hyperparathyroidism of renal origin: Secondary | ICD-10-CM | POA: Diagnosis not present

## 2017-03-04 DIAGNOSIS — Z992 Dependence on renal dialysis: Secondary | ICD-10-CM | POA: Diagnosis not present

## 2017-03-04 DIAGNOSIS — N2581 Secondary hyperparathyroidism of renal origin: Secondary | ICD-10-CM | POA: Diagnosis not present

## 2017-03-04 DIAGNOSIS — E611 Iron deficiency: Secondary | ICD-10-CM | POA: Diagnosis not present

## 2017-03-04 DIAGNOSIS — N186 End stage renal disease: Secondary | ICD-10-CM | POA: Diagnosis not present

## 2017-03-06 ENCOUNTER — Ambulatory Visit (INDEPENDENT_AMBULATORY_CARE_PROVIDER_SITE_OTHER): Payer: Medicare Other | Admitting: Vascular Surgery

## 2017-03-06 ENCOUNTER — Encounter (INDEPENDENT_AMBULATORY_CARE_PROVIDER_SITE_OTHER): Payer: Medicare Other

## 2017-03-07 DIAGNOSIS — N186 End stage renal disease: Secondary | ICD-10-CM | POA: Diagnosis not present

## 2017-03-07 DIAGNOSIS — E611 Iron deficiency: Secondary | ICD-10-CM | POA: Diagnosis not present

## 2017-03-07 DIAGNOSIS — Z992 Dependence on renal dialysis: Secondary | ICD-10-CM | POA: Diagnosis not present

## 2017-03-07 DIAGNOSIS — N2581 Secondary hyperparathyroidism of renal origin: Secondary | ICD-10-CM | POA: Diagnosis not present

## 2017-03-09 DIAGNOSIS — E611 Iron deficiency: Secondary | ICD-10-CM | POA: Diagnosis not present

## 2017-03-09 DIAGNOSIS — Z992 Dependence on renal dialysis: Secondary | ICD-10-CM | POA: Diagnosis not present

## 2017-03-09 DIAGNOSIS — N2581 Secondary hyperparathyroidism of renal origin: Secondary | ICD-10-CM | POA: Diagnosis not present

## 2017-03-09 DIAGNOSIS — N186 End stage renal disease: Secondary | ICD-10-CM | POA: Diagnosis not present

## 2017-03-11 DIAGNOSIS — N2581 Secondary hyperparathyroidism of renal origin: Secondary | ICD-10-CM | POA: Diagnosis not present

## 2017-03-11 DIAGNOSIS — E611 Iron deficiency: Secondary | ICD-10-CM | POA: Diagnosis not present

## 2017-03-11 DIAGNOSIS — Z992 Dependence on renal dialysis: Secondary | ICD-10-CM | POA: Diagnosis not present

## 2017-03-11 DIAGNOSIS — N186 End stage renal disease: Secondary | ICD-10-CM | POA: Diagnosis not present

## 2017-03-14 DIAGNOSIS — Z992 Dependence on renal dialysis: Secondary | ICD-10-CM | POA: Diagnosis not present

## 2017-03-14 DIAGNOSIS — N186 End stage renal disease: Secondary | ICD-10-CM | POA: Diagnosis not present

## 2017-03-14 DIAGNOSIS — N2581 Secondary hyperparathyroidism of renal origin: Secondary | ICD-10-CM | POA: Diagnosis not present

## 2017-03-14 DIAGNOSIS — E611 Iron deficiency: Secondary | ICD-10-CM | POA: Diagnosis not present

## 2017-03-16 DIAGNOSIS — Z992 Dependence on renal dialysis: Secondary | ICD-10-CM | POA: Diagnosis not present

## 2017-03-16 DIAGNOSIS — N2581 Secondary hyperparathyroidism of renal origin: Secondary | ICD-10-CM | POA: Diagnosis not present

## 2017-03-16 DIAGNOSIS — N186 End stage renal disease: Secondary | ICD-10-CM | POA: Diagnosis not present

## 2017-03-16 DIAGNOSIS — E611 Iron deficiency: Secondary | ICD-10-CM | POA: Diagnosis not present

## 2017-03-17 DIAGNOSIS — N186 End stage renal disease: Secondary | ICD-10-CM | POA: Diagnosis not present

## 2017-03-17 DIAGNOSIS — Z992 Dependence on renal dialysis: Secondary | ICD-10-CM | POA: Diagnosis not present

## 2017-03-18 DIAGNOSIS — N2581 Secondary hyperparathyroidism of renal origin: Secondary | ICD-10-CM | POA: Diagnosis not present

## 2017-03-18 DIAGNOSIS — N186 End stage renal disease: Secondary | ICD-10-CM | POA: Diagnosis not present

## 2017-03-18 DIAGNOSIS — E611 Iron deficiency: Secondary | ICD-10-CM | POA: Diagnosis not present

## 2017-03-18 DIAGNOSIS — Z992 Dependence on renal dialysis: Secondary | ICD-10-CM | POA: Diagnosis not present

## 2017-03-21 DIAGNOSIS — Z992 Dependence on renal dialysis: Secondary | ICD-10-CM | POA: Diagnosis not present

## 2017-03-21 DIAGNOSIS — N2581 Secondary hyperparathyroidism of renal origin: Secondary | ICD-10-CM | POA: Diagnosis not present

## 2017-03-21 DIAGNOSIS — E611 Iron deficiency: Secondary | ICD-10-CM | POA: Diagnosis not present

## 2017-03-21 DIAGNOSIS — N186 End stage renal disease: Secondary | ICD-10-CM | POA: Diagnosis not present

## 2017-03-23 DIAGNOSIS — N186 End stage renal disease: Secondary | ICD-10-CM | POA: Diagnosis not present

## 2017-03-23 DIAGNOSIS — N2581 Secondary hyperparathyroidism of renal origin: Secondary | ICD-10-CM | POA: Diagnosis not present

## 2017-03-23 DIAGNOSIS — E611 Iron deficiency: Secondary | ICD-10-CM | POA: Diagnosis not present

## 2017-03-23 DIAGNOSIS — Z992 Dependence on renal dialysis: Secondary | ICD-10-CM | POA: Diagnosis not present

## 2017-03-25 DIAGNOSIS — N186 End stage renal disease: Secondary | ICD-10-CM | POA: Diagnosis not present

## 2017-03-25 DIAGNOSIS — E611 Iron deficiency: Secondary | ICD-10-CM | POA: Diagnosis not present

## 2017-03-25 DIAGNOSIS — N2581 Secondary hyperparathyroidism of renal origin: Secondary | ICD-10-CM | POA: Diagnosis not present

## 2017-03-25 DIAGNOSIS — Z992 Dependence on renal dialysis: Secondary | ICD-10-CM | POA: Diagnosis not present

## 2017-03-28 DIAGNOSIS — N186 End stage renal disease: Secondary | ICD-10-CM | POA: Diagnosis not present

## 2017-03-28 DIAGNOSIS — Z992 Dependence on renal dialysis: Secondary | ICD-10-CM | POA: Diagnosis not present

## 2017-03-28 DIAGNOSIS — N2581 Secondary hyperparathyroidism of renal origin: Secondary | ICD-10-CM | POA: Diagnosis not present

## 2017-03-28 DIAGNOSIS — E611 Iron deficiency: Secondary | ICD-10-CM | POA: Diagnosis not present

## 2017-03-30 DIAGNOSIS — E611 Iron deficiency: Secondary | ICD-10-CM | POA: Diagnosis not present

## 2017-03-30 DIAGNOSIS — N2581 Secondary hyperparathyroidism of renal origin: Secondary | ICD-10-CM | POA: Diagnosis not present

## 2017-03-30 DIAGNOSIS — Z992 Dependence on renal dialysis: Secondary | ICD-10-CM | POA: Diagnosis not present

## 2017-03-30 DIAGNOSIS — N186 End stage renal disease: Secondary | ICD-10-CM | POA: Diagnosis not present

## 2017-04-01 DIAGNOSIS — E611 Iron deficiency: Secondary | ICD-10-CM | POA: Diagnosis not present

## 2017-04-01 DIAGNOSIS — Z992 Dependence on renal dialysis: Secondary | ICD-10-CM | POA: Diagnosis not present

## 2017-04-01 DIAGNOSIS — N186 End stage renal disease: Secondary | ICD-10-CM | POA: Diagnosis not present

## 2017-04-01 DIAGNOSIS — N2581 Secondary hyperparathyroidism of renal origin: Secondary | ICD-10-CM | POA: Diagnosis not present

## 2017-04-04 DIAGNOSIS — N186 End stage renal disease: Secondary | ICD-10-CM | POA: Diagnosis not present

## 2017-04-04 DIAGNOSIS — N2581 Secondary hyperparathyroidism of renal origin: Secondary | ICD-10-CM | POA: Diagnosis not present

## 2017-04-04 DIAGNOSIS — E611 Iron deficiency: Secondary | ICD-10-CM | POA: Diagnosis not present

## 2017-04-04 DIAGNOSIS — Z992 Dependence on renal dialysis: Secondary | ICD-10-CM | POA: Diagnosis not present

## 2017-04-06 DIAGNOSIS — Z992 Dependence on renal dialysis: Secondary | ICD-10-CM | POA: Diagnosis not present

## 2017-04-06 DIAGNOSIS — E611 Iron deficiency: Secondary | ICD-10-CM | POA: Diagnosis not present

## 2017-04-06 DIAGNOSIS — N2581 Secondary hyperparathyroidism of renal origin: Secondary | ICD-10-CM | POA: Diagnosis not present

## 2017-04-06 DIAGNOSIS — N186 End stage renal disease: Secondary | ICD-10-CM | POA: Diagnosis not present

## 2017-04-08 DIAGNOSIS — E611 Iron deficiency: Secondary | ICD-10-CM | POA: Diagnosis not present

## 2017-04-08 DIAGNOSIS — N2581 Secondary hyperparathyroidism of renal origin: Secondary | ICD-10-CM | POA: Diagnosis not present

## 2017-04-08 DIAGNOSIS — N186 End stage renal disease: Secondary | ICD-10-CM | POA: Diagnosis not present

## 2017-04-08 DIAGNOSIS — Z992 Dependence on renal dialysis: Secondary | ICD-10-CM | POA: Diagnosis not present

## 2017-04-10 DIAGNOSIS — N186 End stage renal disease: Secondary | ICD-10-CM | POA: Diagnosis not present

## 2017-04-10 DIAGNOSIS — N2581 Secondary hyperparathyroidism of renal origin: Secondary | ICD-10-CM | POA: Diagnosis not present

## 2017-04-10 DIAGNOSIS — Z992 Dependence on renal dialysis: Secondary | ICD-10-CM | POA: Diagnosis not present

## 2017-04-10 DIAGNOSIS — E611 Iron deficiency: Secondary | ICD-10-CM | POA: Diagnosis not present

## 2017-04-13 DIAGNOSIS — N186 End stage renal disease: Secondary | ICD-10-CM | POA: Diagnosis not present

## 2017-04-13 DIAGNOSIS — E611 Iron deficiency: Secondary | ICD-10-CM | POA: Diagnosis not present

## 2017-04-13 DIAGNOSIS — N2581 Secondary hyperparathyroidism of renal origin: Secondary | ICD-10-CM | POA: Diagnosis not present

## 2017-04-13 DIAGNOSIS — Z992 Dependence on renal dialysis: Secondary | ICD-10-CM | POA: Diagnosis not present

## 2017-04-15 DIAGNOSIS — Z992 Dependence on renal dialysis: Secondary | ICD-10-CM | POA: Diagnosis not present

## 2017-04-15 DIAGNOSIS — E611 Iron deficiency: Secondary | ICD-10-CM | POA: Diagnosis not present

## 2017-04-15 DIAGNOSIS — N2581 Secondary hyperparathyroidism of renal origin: Secondary | ICD-10-CM | POA: Diagnosis not present

## 2017-04-15 DIAGNOSIS — N186 End stage renal disease: Secondary | ICD-10-CM | POA: Diagnosis not present

## 2017-04-17 DIAGNOSIS — Z992 Dependence on renal dialysis: Secondary | ICD-10-CM | POA: Diagnosis not present

## 2017-04-17 DIAGNOSIS — N186 End stage renal disease: Secondary | ICD-10-CM | POA: Diagnosis not present

## 2017-04-18 DIAGNOSIS — D509 Iron deficiency anemia, unspecified: Secondary | ICD-10-CM | POA: Diagnosis not present

## 2017-04-18 DIAGNOSIS — Z992 Dependence on renal dialysis: Secondary | ICD-10-CM | POA: Diagnosis not present

## 2017-04-18 DIAGNOSIS — N2581 Secondary hyperparathyroidism of renal origin: Secondary | ICD-10-CM | POA: Diagnosis not present

## 2017-04-18 DIAGNOSIS — N186 End stage renal disease: Secondary | ICD-10-CM | POA: Diagnosis not present

## 2017-04-18 DIAGNOSIS — E611 Iron deficiency: Secondary | ICD-10-CM | POA: Diagnosis not present

## 2017-04-20 DIAGNOSIS — N2581 Secondary hyperparathyroidism of renal origin: Secondary | ICD-10-CM | POA: Diagnosis not present

## 2017-04-20 DIAGNOSIS — Z992 Dependence on renal dialysis: Secondary | ICD-10-CM | POA: Diagnosis not present

## 2017-04-20 DIAGNOSIS — D509 Iron deficiency anemia, unspecified: Secondary | ICD-10-CM | POA: Diagnosis not present

## 2017-04-20 DIAGNOSIS — E611 Iron deficiency: Secondary | ICD-10-CM | POA: Diagnosis not present

## 2017-04-20 DIAGNOSIS — N186 End stage renal disease: Secondary | ICD-10-CM | POA: Diagnosis not present

## 2017-04-22 DIAGNOSIS — E611 Iron deficiency: Secondary | ICD-10-CM | POA: Diagnosis not present

## 2017-04-22 DIAGNOSIS — D509 Iron deficiency anemia, unspecified: Secondary | ICD-10-CM | POA: Diagnosis not present

## 2017-04-22 DIAGNOSIS — N186 End stage renal disease: Secondary | ICD-10-CM | POA: Diagnosis not present

## 2017-04-22 DIAGNOSIS — N2581 Secondary hyperparathyroidism of renal origin: Secondary | ICD-10-CM | POA: Diagnosis not present

## 2017-04-22 DIAGNOSIS — Z992 Dependence on renal dialysis: Secondary | ICD-10-CM | POA: Diagnosis not present

## 2017-04-25 DIAGNOSIS — D509 Iron deficiency anemia, unspecified: Secondary | ICD-10-CM | POA: Diagnosis not present

## 2017-04-25 DIAGNOSIS — N186 End stage renal disease: Secondary | ICD-10-CM | POA: Diagnosis not present

## 2017-04-25 DIAGNOSIS — N2581 Secondary hyperparathyroidism of renal origin: Secondary | ICD-10-CM | POA: Diagnosis not present

## 2017-04-25 DIAGNOSIS — E611 Iron deficiency: Secondary | ICD-10-CM | POA: Diagnosis not present

## 2017-04-25 DIAGNOSIS — Z992 Dependence on renal dialysis: Secondary | ICD-10-CM | POA: Diagnosis not present

## 2017-04-27 DIAGNOSIS — N186 End stage renal disease: Secondary | ICD-10-CM | POA: Diagnosis not present

## 2017-04-27 DIAGNOSIS — E611 Iron deficiency: Secondary | ICD-10-CM | POA: Diagnosis not present

## 2017-04-27 DIAGNOSIS — D509 Iron deficiency anemia, unspecified: Secondary | ICD-10-CM | POA: Diagnosis not present

## 2017-04-27 DIAGNOSIS — N2581 Secondary hyperparathyroidism of renal origin: Secondary | ICD-10-CM | POA: Diagnosis not present

## 2017-04-27 DIAGNOSIS — Z992 Dependence on renal dialysis: Secondary | ICD-10-CM | POA: Diagnosis not present

## 2017-04-29 DIAGNOSIS — N2581 Secondary hyperparathyroidism of renal origin: Secondary | ICD-10-CM | POA: Diagnosis not present

## 2017-04-29 DIAGNOSIS — E611 Iron deficiency: Secondary | ICD-10-CM | POA: Diagnosis not present

## 2017-04-29 DIAGNOSIS — N186 End stage renal disease: Secondary | ICD-10-CM | POA: Diagnosis not present

## 2017-04-29 DIAGNOSIS — D509 Iron deficiency anemia, unspecified: Secondary | ICD-10-CM | POA: Diagnosis not present

## 2017-04-29 DIAGNOSIS — Z992 Dependence on renal dialysis: Secondary | ICD-10-CM | POA: Diagnosis not present

## 2017-05-02 DIAGNOSIS — E611 Iron deficiency: Secondary | ICD-10-CM | POA: Diagnosis not present

## 2017-05-02 DIAGNOSIS — D509 Iron deficiency anemia, unspecified: Secondary | ICD-10-CM | POA: Diagnosis not present

## 2017-05-02 DIAGNOSIS — N2581 Secondary hyperparathyroidism of renal origin: Secondary | ICD-10-CM | POA: Diagnosis not present

## 2017-05-02 DIAGNOSIS — Z992 Dependence on renal dialysis: Secondary | ICD-10-CM | POA: Diagnosis not present

## 2017-05-02 DIAGNOSIS — N186 End stage renal disease: Secondary | ICD-10-CM | POA: Diagnosis not present

## 2017-05-04 DIAGNOSIS — N2581 Secondary hyperparathyroidism of renal origin: Secondary | ICD-10-CM | POA: Diagnosis not present

## 2017-05-04 DIAGNOSIS — N186 End stage renal disease: Secondary | ICD-10-CM | POA: Diagnosis not present

## 2017-05-04 DIAGNOSIS — D509 Iron deficiency anemia, unspecified: Secondary | ICD-10-CM | POA: Diagnosis not present

## 2017-05-04 DIAGNOSIS — E611 Iron deficiency: Secondary | ICD-10-CM | POA: Diagnosis not present

## 2017-05-04 DIAGNOSIS — Z992 Dependence on renal dialysis: Secondary | ICD-10-CM | POA: Diagnosis not present

## 2017-05-06 DIAGNOSIS — N2581 Secondary hyperparathyroidism of renal origin: Secondary | ICD-10-CM | POA: Diagnosis not present

## 2017-05-06 DIAGNOSIS — Z992 Dependence on renal dialysis: Secondary | ICD-10-CM | POA: Diagnosis not present

## 2017-05-06 DIAGNOSIS — D509 Iron deficiency anemia, unspecified: Secondary | ICD-10-CM | POA: Diagnosis not present

## 2017-05-06 DIAGNOSIS — N186 End stage renal disease: Secondary | ICD-10-CM | POA: Diagnosis not present

## 2017-05-06 DIAGNOSIS — E611 Iron deficiency: Secondary | ICD-10-CM | POA: Diagnosis not present

## 2017-05-09 DIAGNOSIS — E611 Iron deficiency: Secondary | ICD-10-CM | POA: Diagnosis not present

## 2017-05-09 DIAGNOSIS — D509 Iron deficiency anemia, unspecified: Secondary | ICD-10-CM | POA: Diagnosis not present

## 2017-05-09 DIAGNOSIS — N186 End stage renal disease: Secondary | ICD-10-CM | POA: Diagnosis not present

## 2017-05-09 DIAGNOSIS — N2581 Secondary hyperparathyroidism of renal origin: Secondary | ICD-10-CM | POA: Diagnosis not present

## 2017-05-09 DIAGNOSIS — Z992 Dependence on renal dialysis: Secondary | ICD-10-CM | POA: Diagnosis not present

## 2017-05-11 DIAGNOSIS — Z992 Dependence on renal dialysis: Secondary | ICD-10-CM | POA: Diagnosis not present

## 2017-05-11 DIAGNOSIS — E611 Iron deficiency: Secondary | ICD-10-CM | POA: Diagnosis not present

## 2017-05-11 DIAGNOSIS — D509 Iron deficiency anemia, unspecified: Secondary | ICD-10-CM | POA: Diagnosis not present

## 2017-05-11 DIAGNOSIS — N186 End stage renal disease: Secondary | ICD-10-CM | POA: Diagnosis not present

## 2017-05-11 DIAGNOSIS — N2581 Secondary hyperparathyroidism of renal origin: Secondary | ICD-10-CM | POA: Diagnosis not present

## 2017-05-13 DIAGNOSIS — E611 Iron deficiency: Secondary | ICD-10-CM | POA: Diagnosis not present

## 2017-05-13 DIAGNOSIS — N186 End stage renal disease: Secondary | ICD-10-CM | POA: Diagnosis not present

## 2017-05-13 DIAGNOSIS — Z992 Dependence on renal dialysis: Secondary | ICD-10-CM | POA: Diagnosis not present

## 2017-05-13 DIAGNOSIS — N2581 Secondary hyperparathyroidism of renal origin: Secondary | ICD-10-CM | POA: Diagnosis not present

## 2017-05-13 DIAGNOSIS — D509 Iron deficiency anemia, unspecified: Secondary | ICD-10-CM | POA: Diagnosis not present

## 2017-05-16 DIAGNOSIS — N186 End stage renal disease: Secondary | ICD-10-CM | POA: Diagnosis not present

## 2017-05-16 DIAGNOSIS — D509 Iron deficiency anemia, unspecified: Secondary | ICD-10-CM | POA: Diagnosis not present

## 2017-05-16 DIAGNOSIS — N2581 Secondary hyperparathyroidism of renal origin: Secondary | ICD-10-CM | POA: Diagnosis not present

## 2017-05-16 DIAGNOSIS — E611 Iron deficiency: Secondary | ICD-10-CM | POA: Diagnosis not present

## 2017-05-16 DIAGNOSIS — Z992 Dependence on renal dialysis: Secondary | ICD-10-CM | POA: Diagnosis not present

## 2017-05-18 DIAGNOSIS — D509 Iron deficiency anemia, unspecified: Secondary | ICD-10-CM | POA: Diagnosis not present

## 2017-05-18 DIAGNOSIS — E611 Iron deficiency: Secondary | ICD-10-CM | POA: Diagnosis not present

## 2017-05-18 DIAGNOSIS — N186 End stage renal disease: Secondary | ICD-10-CM | POA: Diagnosis not present

## 2017-05-18 DIAGNOSIS — N2581 Secondary hyperparathyroidism of renal origin: Secondary | ICD-10-CM | POA: Diagnosis not present

## 2017-05-18 DIAGNOSIS — Z992 Dependence on renal dialysis: Secondary | ICD-10-CM | POA: Diagnosis not present

## 2017-05-20 DIAGNOSIS — Z992 Dependence on renal dialysis: Secondary | ICD-10-CM | POA: Diagnosis not present

## 2017-05-20 DIAGNOSIS — E611 Iron deficiency: Secondary | ICD-10-CM | POA: Diagnosis not present

## 2017-05-20 DIAGNOSIS — N186 End stage renal disease: Secondary | ICD-10-CM | POA: Diagnosis not present

## 2017-05-20 DIAGNOSIS — N2581 Secondary hyperparathyroidism of renal origin: Secondary | ICD-10-CM | POA: Diagnosis not present

## 2017-05-23 DIAGNOSIS — N2581 Secondary hyperparathyroidism of renal origin: Secondary | ICD-10-CM | POA: Diagnosis not present

## 2017-05-23 DIAGNOSIS — E611 Iron deficiency: Secondary | ICD-10-CM | POA: Diagnosis not present

## 2017-05-23 DIAGNOSIS — N186 End stage renal disease: Secondary | ICD-10-CM | POA: Diagnosis not present

## 2017-05-23 DIAGNOSIS — Z992 Dependence on renal dialysis: Secondary | ICD-10-CM | POA: Diagnosis not present

## 2017-05-25 DIAGNOSIS — E611 Iron deficiency: Secondary | ICD-10-CM | POA: Diagnosis not present

## 2017-05-25 DIAGNOSIS — N2581 Secondary hyperparathyroidism of renal origin: Secondary | ICD-10-CM | POA: Diagnosis not present

## 2017-05-25 DIAGNOSIS — Z992 Dependence on renal dialysis: Secondary | ICD-10-CM | POA: Diagnosis not present

## 2017-05-25 DIAGNOSIS — N186 End stage renal disease: Secondary | ICD-10-CM | POA: Diagnosis not present

## 2017-05-27 DIAGNOSIS — N2581 Secondary hyperparathyroidism of renal origin: Secondary | ICD-10-CM | POA: Diagnosis not present

## 2017-05-27 DIAGNOSIS — N186 End stage renal disease: Secondary | ICD-10-CM | POA: Diagnosis not present

## 2017-05-27 DIAGNOSIS — Z992 Dependence on renal dialysis: Secondary | ICD-10-CM | POA: Diagnosis not present

## 2017-05-27 DIAGNOSIS — E611 Iron deficiency: Secondary | ICD-10-CM | POA: Diagnosis not present

## 2017-05-30 DIAGNOSIS — N186 End stage renal disease: Secondary | ICD-10-CM | POA: Diagnosis not present

## 2017-05-30 DIAGNOSIS — Z992 Dependence on renal dialysis: Secondary | ICD-10-CM | POA: Diagnosis not present

## 2017-05-30 DIAGNOSIS — E611 Iron deficiency: Secondary | ICD-10-CM | POA: Diagnosis not present

## 2017-05-30 DIAGNOSIS — N2581 Secondary hyperparathyroidism of renal origin: Secondary | ICD-10-CM | POA: Diagnosis not present

## 2017-06-01 DIAGNOSIS — E611 Iron deficiency: Secondary | ICD-10-CM | POA: Diagnosis not present

## 2017-06-01 DIAGNOSIS — N2581 Secondary hyperparathyroidism of renal origin: Secondary | ICD-10-CM | POA: Diagnosis not present

## 2017-06-01 DIAGNOSIS — Z992 Dependence on renal dialysis: Secondary | ICD-10-CM | POA: Diagnosis not present

## 2017-06-01 DIAGNOSIS — N186 End stage renal disease: Secondary | ICD-10-CM | POA: Diagnosis not present

## 2017-06-03 DIAGNOSIS — Z992 Dependence on renal dialysis: Secondary | ICD-10-CM | POA: Diagnosis not present

## 2017-06-03 DIAGNOSIS — N2581 Secondary hyperparathyroidism of renal origin: Secondary | ICD-10-CM | POA: Diagnosis not present

## 2017-06-03 DIAGNOSIS — N186 End stage renal disease: Secondary | ICD-10-CM | POA: Diagnosis not present

## 2017-06-03 DIAGNOSIS — E611 Iron deficiency: Secondary | ICD-10-CM | POA: Diagnosis not present

## 2017-06-06 DIAGNOSIS — E611 Iron deficiency: Secondary | ICD-10-CM | POA: Diagnosis not present

## 2017-06-06 DIAGNOSIS — N186 End stage renal disease: Secondary | ICD-10-CM | POA: Diagnosis not present

## 2017-06-06 DIAGNOSIS — N2581 Secondary hyperparathyroidism of renal origin: Secondary | ICD-10-CM | POA: Diagnosis not present

## 2017-06-06 DIAGNOSIS — Z992 Dependence on renal dialysis: Secondary | ICD-10-CM | POA: Diagnosis not present

## 2017-06-08 DIAGNOSIS — N2581 Secondary hyperparathyroidism of renal origin: Secondary | ICD-10-CM | POA: Diagnosis not present

## 2017-06-08 DIAGNOSIS — Z992 Dependence on renal dialysis: Secondary | ICD-10-CM | POA: Diagnosis not present

## 2017-06-08 DIAGNOSIS — E611 Iron deficiency: Secondary | ICD-10-CM | POA: Diagnosis not present

## 2017-06-08 DIAGNOSIS — N186 End stage renal disease: Secondary | ICD-10-CM | POA: Diagnosis not present

## 2017-06-10 DIAGNOSIS — Z992 Dependence on renal dialysis: Secondary | ICD-10-CM | POA: Diagnosis not present

## 2017-06-10 DIAGNOSIS — N2581 Secondary hyperparathyroidism of renal origin: Secondary | ICD-10-CM | POA: Diagnosis not present

## 2017-06-10 DIAGNOSIS — E611 Iron deficiency: Secondary | ICD-10-CM | POA: Diagnosis not present

## 2017-06-10 DIAGNOSIS — N186 End stage renal disease: Secondary | ICD-10-CM | POA: Diagnosis not present

## 2017-06-12 DIAGNOSIS — N189 Chronic kidney disease, unspecified: Secondary | ICD-10-CM | POA: Diagnosis not present

## 2017-06-12 DIAGNOSIS — Z7982 Long term (current) use of aspirin: Secondary | ICD-10-CM | POA: Diagnosis not present

## 2017-06-12 DIAGNOSIS — Z7902 Long term (current) use of antithrombotics/antiplatelets: Secondary | ICD-10-CM | POA: Diagnosis not present

## 2017-06-12 DIAGNOSIS — J069 Acute upper respiratory infection, unspecified: Secondary | ICD-10-CM | POA: Diagnosis not present

## 2017-06-12 DIAGNOSIS — Z992 Dependence on renal dialysis: Secondary | ICD-10-CM | POA: Diagnosis not present

## 2017-06-12 DIAGNOSIS — I509 Heart failure, unspecified: Secondary | ICD-10-CM | POA: Diagnosis not present

## 2017-06-12 DIAGNOSIS — M62838 Other muscle spasm: Secondary | ICD-10-CM | POA: Diagnosis not present

## 2017-06-12 DIAGNOSIS — I130 Hypertensive heart and chronic kidney disease with heart failure and stage 1 through stage 4 chronic kidney disease, or unspecified chronic kidney disease: Secondary | ICD-10-CM | POA: Diagnosis not present

## 2017-06-12 DIAGNOSIS — R05 Cough: Secondary | ICD-10-CM | POA: Diagnosis not present

## 2017-06-13 DIAGNOSIS — N186 End stage renal disease: Secondary | ICD-10-CM | POA: Diagnosis not present

## 2017-06-13 DIAGNOSIS — N2581 Secondary hyperparathyroidism of renal origin: Secondary | ICD-10-CM | POA: Diagnosis not present

## 2017-06-13 DIAGNOSIS — Z992 Dependence on renal dialysis: Secondary | ICD-10-CM | POA: Diagnosis not present

## 2017-06-13 DIAGNOSIS — E611 Iron deficiency: Secondary | ICD-10-CM | POA: Diagnosis not present

## 2017-06-15 DIAGNOSIS — E611 Iron deficiency: Secondary | ICD-10-CM | POA: Diagnosis not present

## 2017-06-15 DIAGNOSIS — N186 End stage renal disease: Secondary | ICD-10-CM | POA: Diagnosis not present

## 2017-06-15 DIAGNOSIS — Z992 Dependence on renal dialysis: Secondary | ICD-10-CM | POA: Diagnosis not present

## 2017-06-15 DIAGNOSIS — N2581 Secondary hyperparathyroidism of renal origin: Secondary | ICD-10-CM | POA: Diagnosis not present

## 2017-06-17 DIAGNOSIS — E611 Iron deficiency: Secondary | ICD-10-CM | POA: Diagnosis not present

## 2017-06-17 DIAGNOSIS — N186 End stage renal disease: Secondary | ICD-10-CM | POA: Diagnosis not present

## 2017-06-17 DIAGNOSIS — Z992 Dependence on renal dialysis: Secondary | ICD-10-CM | POA: Diagnosis not present

## 2017-06-17 DIAGNOSIS — N2581 Secondary hyperparathyroidism of renal origin: Secondary | ICD-10-CM | POA: Diagnosis not present

## 2017-06-20 DIAGNOSIS — E611 Iron deficiency: Secondary | ICD-10-CM | POA: Diagnosis not present

## 2017-06-20 DIAGNOSIS — N186 End stage renal disease: Secondary | ICD-10-CM | POA: Diagnosis not present

## 2017-06-20 DIAGNOSIS — Z992 Dependence on renal dialysis: Secondary | ICD-10-CM | POA: Diagnosis not present

## 2017-06-20 DIAGNOSIS — N2581 Secondary hyperparathyroidism of renal origin: Secondary | ICD-10-CM | POA: Diagnosis not present

## 2017-06-22 DIAGNOSIS — N2581 Secondary hyperparathyroidism of renal origin: Secondary | ICD-10-CM | POA: Diagnosis not present

## 2017-06-22 DIAGNOSIS — E611 Iron deficiency: Secondary | ICD-10-CM | POA: Diagnosis not present

## 2017-06-22 DIAGNOSIS — N186 End stage renal disease: Secondary | ICD-10-CM | POA: Diagnosis not present

## 2017-06-22 DIAGNOSIS — Z992 Dependence on renal dialysis: Secondary | ICD-10-CM | POA: Diagnosis not present

## 2017-06-24 DIAGNOSIS — Z992 Dependence on renal dialysis: Secondary | ICD-10-CM | POA: Diagnosis not present

## 2017-06-24 DIAGNOSIS — N186 End stage renal disease: Secondary | ICD-10-CM | POA: Diagnosis not present

## 2017-06-24 DIAGNOSIS — E611 Iron deficiency: Secondary | ICD-10-CM | POA: Diagnosis not present

## 2017-06-24 DIAGNOSIS — N2581 Secondary hyperparathyroidism of renal origin: Secondary | ICD-10-CM | POA: Diagnosis not present

## 2017-06-27 DIAGNOSIS — Z992 Dependence on renal dialysis: Secondary | ICD-10-CM | POA: Diagnosis not present

## 2017-06-27 DIAGNOSIS — N186 End stage renal disease: Secondary | ICD-10-CM | POA: Diagnosis not present

## 2017-06-27 DIAGNOSIS — N2581 Secondary hyperparathyroidism of renal origin: Secondary | ICD-10-CM | POA: Diagnosis not present

## 2017-06-27 DIAGNOSIS — E611 Iron deficiency: Secondary | ICD-10-CM | POA: Diagnosis not present

## 2017-06-29 DIAGNOSIS — N2581 Secondary hyperparathyroidism of renal origin: Secondary | ICD-10-CM | POA: Diagnosis not present

## 2017-06-29 DIAGNOSIS — E611 Iron deficiency: Secondary | ICD-10-CM | POA: Diagnosis not present

## 2017-06-29 DIAGNOSIS — Z992 Dependence on renal dialysis: Secondary | ICD-10-CM | POA: Diagnosis not present

## 2017-06-29 DIAGNOSIS — N186 End stage renal disease: Secondary | ICD-10-CM | POA: Diagnosis not present

## 2017-07-01 DIAGNOSIS — Z992 Dependence on renal dialysis: Secondary | ICD-10-CM | POA: Diagnosis not present

## 2017-07-01 DIAGNOSIS — N186 End stage renal disease: Secondary | ICD-10-CM | POA: Diagnosis not present

## 2017-07-01 DIAGNOSIS — E611 Iron deficiency: Secondary | ICD-10-CM | POA: Diagnosis not present

## 2017-07-01 DIAGNOSIS — N2581 Secondary hyperparathyroidism of renal origin: Secondary | ICD-10-CM | POA: Diagnosis not present

## 2017-07-04 DIAGNOSIS — N186 End stage renal disease: Secondary | ICD-10-CM | POA: Diagnosis not present

## 2017-07-04 DIAGNOSIS — E611 Iron deficiency: Secondary | ICD-10-CM | POA: Diagnosis not present

## 2017-07-04 DIAGNOSIS — N2581 Secondary hyperparathyroidism of renal origin: Secondary | ICD-10-CM | POA: Diagnosis not present

## 2017-07-04 DIAGNOSIS — Z992 Dependence on renal dialysis: Secondary | ICD-10-CM | POA: Diagnosis not present

## 2017-07-06 DIAGNOSIS — N186 End stage renal disease: Secondary | ICD-10-CM | POA: Diagnosis not present

## 2017-07-06 DIAGNOSIS — N2581 Secondary hyperparathyroidism of renal origin: Secondary | ICD-10-CM | POA: Diagnosis not present

## 2017-07-06 DIAGNOSIS — Z992 Dependence on renal dialysis: Secondary | ICD-10-CM | POA: Diagnosis not present

## 2017-07-06 DIAGNOSIS — E611 Iron deficiency: Secondary | ICD-10-CM | POA: Diagnosis not present

## 2017-07-08 DIAGNOSIS — N2581 Secondary hyperparathyroidism of renal origin: Secondary | ICD-10-CM | POA: Diagnosis not present

## 2017-07-08 DIAGNOSIS — E611 Iron deficiency: Secondary | ICD-10-CM | POA: Diagnosis not present

## 2017-07-08 DIAGNOSIS — N186 End stage renal disease: Secondary | ICD-10-CM | POA: Diagnosis not present

## 2017-07-08 DIAGNOSIS — Z992 Dependence on renal dialysis: Secondary | ICD-10-CM | POA: Diagnosis not present

## 2017-07-11 DIAGNOSIS — N2581 Secondary hyperparathyroidism of renal origin: Secondary | ICD-10-CM | POA: Diagnosis not present

## 2017-07-11 DIAGNOSIS — E611 Iron deficiency: Secondary | ICD-10-CM | POA: Diagnosis not present

## 2017-07-11 DIAGNOSIS — Z992 Dependence on renal dialysis: Secondary | ICD-10-CM | POA: Diagnosis not present

## 2017-07-11 DIAGNOSIS — N186 End stage renal disease: Secondary | ICD-10-CM | POA: Diagnosis not present

## 2017-07-13 DIAGNOSIS — Z992 Dependence on renal dialysis: Secondary | ICD-10-CM | POA: Diagnosis not present

## 2017-07-13 DIAGNOSIS — E611 Iron deficiency: Secondary | ICD-10-CM | POA: Diagnosis not present

## 2017-07-13 DIAGNOSIS — N2581 Secondary hyperparathyroidism of renal origin: Secondary | ICD-10-CM | POA: Diagnosis not present

## 2017-07-13 DIAGNOSIS — N186 End stage renal disease: Secondary | ICD-10-CM | POA: Diagnosis not present

## 2017-07-14 ENCOUNTER — Other Ambulatory Visit: Payer: Self-pay

## 2017-07-14 ENCOUNTER — Encounter (HOSPITAL_COMMUNITY): Payer: Self-pay

## 2017-07-14 ENCOUNTER — Emergency Department (HOSPITAL_COMMUNITY): Payer: Medicare Other

## 2017-07-14 ENCOUNTER — Emergency Department (HOSPITAL_COMMUNITY)
Admission: EM | Admit: 2017-07-14 | Discharge: 2017-07-14 | Disposition: A | Discharge status: Home / Self Care | Payer: Medicare Other | Attending: Emergency Medicine | Admitting: Emergency Medicine

## 2017-07-14 DIAGNOSIS — R000 Tachycardia, unspecified: Secondary | ICD-10-CM | POA: Diagnosis not present

## 2017-07-14 DIAGNOSIS — R05 Cough: Secondary | ICD-10-CM | POA: Diagnosis not present

## 2017-07-14 DIAGNOSIS — N186 End stage renal disease: Secondary | ICD-10-CM | POA: Diagnosis not present

## 2017-07-14 DIAGNOSIS — I509 Heart failure, unspecified: Secondary | ICD-10-CM | POA: Diagnosis not present

## 2017-07-14 DIAGNOSIS — Z7982 Long term (current) use of aspirin: Secondary | ICD-10-CM | POA: Insufficient documentation

## 2017-07-14 DIAGNOSIS — R0602 Shortness of breath: Secondary | ICD-10-CM | POA: Diagnosis not present

## 2017-07-14 DIAGNOSIS — Z992 Dependence on renal dialysis: Secondary | ICD-10-CM | POA: Insufficient documentation

## 2017-07-14 DIAGNOSIS — F17220 Nicotine dependence, chewing tobacco, uncomplicated: Secondary | ICD-10-CM | POA: Diagnosis not present

## 2017-07-14 DIAGNOSIS — Z8679 Personal history of other diseases of the circulatory system: Secondary | ICD-10-CM

## 2017-07-14 DIAGNOSIS — Z7901 Long term (current) use of anticoagulants: Secondary | ICD-10-CM | POA: Insufficient documentation

## 2017-07-14 DIAGNOSIS — R059 Cough, unspecified: Secondary | ICD-10-CM

## 2017-07-14 DIAGNOSIS — I132 Hypertensive heart and chronic kidney disease with heart failure and with stage 5 chronic kidney disease, or end stage renal disease: Secondary | ICD-10-CM | POA: Diagnosis not present

## 2017-07-14 DIAGNOSIS — Z79899 Other long term (current) drug therapy: Secondary | ICD-10-CM | POA: Diagnosis not present

## 2017-07-14 LAB — CBC WITH DIFFERENTIAL/PLATELET
Basophils Absolute: 0 10*3/uL (ref 0.0–0.1)
Basophils Relative: 1 %
EOS ABS: 0.6 10*3/uL (ref 0.0–0.7)
EOS PCT: 7 %
HCT: 40.6 % (ref 39.0–52.0)
HEMOGLOBIN: 12.2 g/dL — AB (ref 13.0–17.0)
LYMPHS ABS: 0.9 10*3/uL (ref 0.7–4.0)
Lymphocytes Relative: 11 %
MCH: 25.2 pg — AB (ref 26.0–34.0)
MCHC: 30 g/dL (ref 30.0–36.0)
MCV: 83.9 fL (ref 78.0–100.0)
MONO ABS: 0.7 10*3/uL (ref 0.1–1.0)
MONOS PCT: 9 %
Neutro Abs: 5.9 10*3/uL (ref 1.7–7.7)
Neutrophils Relative %: 72 %
Platelets: 257 10*3/uL (ref 150–400)
RBC: 4.84 MIL/uL (ref 4.22–5.81)
RDW: 15.4 % (ref 11.5–15.5)
WBC: 8.1 10*3/uL (ref 4.0–10.5)

## 2017-07-14 LAB — MAGNESIUM: Magnesium: 1.9 mg/dL (ref 1.7–2.4)

## 2017-07-14 LAB — COMPREHENSIVE METABOLIC PANEL
ALK PHOS: 125 U/L (ref 38–126)
ALT: 11 U/L — ABNORMAL LOW (ref 17–63)
ANION GAP: 16 — AB (ref 5–15)
AST: 15 U/L (ref 15–41)
Albumin: 3.3 g/dL — ABNORMAL LOW (ref 3.5–5.0)
BUN: 37 mg/dL — ABNORMAL HIGH (ref 6–20)
CALCIUM: 9 mg/dL (ref 8.9–10.3)
CO2: 25 mmol/L (ref 22–32)
Chloride: 98 mmol/L — ABNORMAL LOW (ref 101–111)
Creatinine, Ser: 8.72 mg/dL — ABNORMAL HIGH (ref 0.61–1.24)
GFR calc non Af Amer: 6 mL/min — ABNORMAL LOW (ref >60)
GFR, EST AFRICAN AMERICAN: 7 mL/min — AB (ref >60)
Glucose, Bld: 81 mg/dL (ref 65–99)
POTASSIUM: 3.8 mmol/L (ref 3.5–5.1)
SODIUM: 139 mmol/L (ref 135–145)
TOTAL PROTEIN: 7.8 g/dL (ref 6.5–8.1)
Total Bilirubin: 1 mg/dL (ref 0.3–1.2)

## 2017-07-14 MED ORDER — BENZONATATE 100 MG PO CAPS: ORAL_CAPSULE | ORAL | 0 refills | Status: DC

## 2017-07-14 NOTE — Discharge Instructions (Signed)
Your electrocardiogram and the monitor revealed both premature atrial contractions and ventricular contractions.  Please discuss these with your primary physician, or your cardiologist.  Your electrocardiogram is negative for any acute cardiac problem.  Your electrolytes are within normal limits.  Your liver function is within normal limits.  Your complete blood count test is negative for acute problem, and your magnesium is within normal limits.  Please use Tessalon Perles 3 times daily for cough.  Please contact your primary physician on Monday to complete the workup concerning your cough.  You may also want to discuss with them whether or not you should be tested for sleep apnea.

## 2017-07-14 NOTE — ED Triage Notes (Signed)
Pt reports that he has been coughing for 6 weeks. Reports that he went to 2 weeks ago and told he had croup and given tessalon pearles. Conts to cough, which is worse at night. Cough is dry

## 2017-07-14 NOTE — ED Notes (Addendum)
HD Tues, thurs and Sat.  Pt states that he had dialysis done yesterday and went as usual, no problems.  Pt denies weight gain but admits to losing weight.

## 2017-07-14 NOTE — ED Notes (Signed)
Pt with cough for 6 weeks, seen at [facility] 3 weeks ago and dx with Croup per pt.  Given medication for cough.  Now pt with SOB lying down flat. NP cough

## 2017-07-14 NOTE — ED Provider Notes (Signed)
[facility] EMERGENCY DEPARTMENT Provider Note   CSN: [id number] Arrival date & time: 07/14/17  2355     History   Chief Complaint Chief Complaint  Patient presents with  . Cough    HPI NORMA IGNASIAK is a 52 y.o. male.  Patient is a 52 year old male who presents to the emergency department with a complaint of cough.  Patient has a history of end-stage renal disease, obesity, congestive heart failure.  Patient states that he takes dialysis on Tuesday Thursday and Saturday.  He states that he had dialysis done on yesterday.  He states that he has been coughing over the past 6 weeks.  He was seen by the emergency department at [facility].  He states he was told that he probably had a virus or croup.  He states that his condition seems to be getting worse instead of getting better so he came to the emergency department for evaluation.  He describes the cough as being mostly dry.  He states the problem is worse when he is lying down flat.  There is no hemoptysis reported.  The cough is essentially nonproductive.  He is not had any high fever.  He is not had any injury or trauma to the chest.  He denies being on any ACE inhibitors.     Past Medical History:  Diagnosis Date  . Aneurysm ([place])     Right arm fistula 3 aneurysms 2011,   plans to have a new procedure  . Angina    occasional, last 6 mo ago  . Chest discomfort     2008  . CHF (congestive heart failure) ([place])   . Dialysis patient [facility])    M-W-F @ [place]  . Dysrhythmia   . Ejection fraction     65%, echo, 2008   /   EF 55-60%, echo, October 28, 2010  . ESRD (end stage renal disease) ([place])    on hemodialysis T_T_S  . Hypertension   . Leg pain   . Orthostatic hypotension     2008  . Overweight(278.02)   . Sinus tachycardia     2008, TSH normal ... happens frequently with heart rate at 120-130  . Syncope     positional after dialysis... 2008    Patient Active Problem List   Diagnosis Date Noted  . Dermatitis  04/06/2016  . End stage renal disease ([place]) 12/22/2011  . Ejection fraction   . Sinus tachycardia   . ESRD (end stage renal disease) ([place])   . Orthostatic hypotension   . Chest discomfort   . Overweight(278.02)   . Syncope   . Aneurysm [facility])     Past Surgical History:  Procedure Laterality Date  . A/V FISTULAGRAM Left 06/30/2016   Procedure: A/V Fistulagram;  Surgeon: Algernon Huxley, MD;  Location: [place] CV LAB;  Service: Cardiovascular;  Laterality: Left;  . ARTERIOVENOUS GRAFT PLACEMENT    . AV FISTULA PLACEMENT    . AV FISTULA PLACEMENT Left 11/18/2015   Procedure: ARTERIOVENOUS (AV) FISTULA CREATION ( RADIOCEPHALIC );  Surgeon: Algernon Huxley, MD;  Location: [facility] ORS;  Service: Vascular;  Laterality: Left;  . AV FISTULA PLACEMENT Left 03/23/2016   Procedure: ARTERIOVENOUS (AV) FISTULA CREATION ( REVISION );  Surgeon: Algernon Huxley, MD;  Location: [facility] ORS;  Service: Vascular;  Laterality: Left;  . [place] REMOVAL Left 03/23/2016   Procedure: REMOVAL OF ARTERIOVENOUS GORETEX GRAFT ([place]);  Surgeon: Algernon Huxley, MD;  Location: [facility] ORS;  Service: Vascular;  Laterality: Left;  . DIALYSIS/PERMA CATHETER REMOVAL N/A 08/01/2016   Procedure: Dialysis/Perma Catheter Removal;  Surgeon: Algernon Huxley, MD;  Location: [place] CV LAB;  Service: Cardiovascular;  Laterality: N/A;  . HERNIA REPAIR    . INSERTION OF DIALYSIS CATHETER  03/01/2011   Procedure: INSERTION OF DIALYSIS CATHETER;  Surgeon: Elam Dutch, MD;  Location: [place];  Service: Vascular;  Laterality: Left;  Exchange of Dialysis Catheter to 27cm 15Fr. Arrow Catheter  . PERIPHERAL VASCULAR CATHETERIZATION N/A 09/01/2014   Procedure: A/V Shuntogram/Fistulagram;  Surgeon: Algernon Huxley, MD;  Location: [place] CV LAB;  Service: Cardiovascular;  Laterality: N/A;  . PERIPHERAL VASCULAR CATHETERIZATION Right 09/01/2014   Procedure: Thrombectomy;  Surgeon: Algernon Huxley, MD;  Location: [place] CV LAB;  Service: Cardiovascular;   Laterality: Right;  . PERIPHERAL VASCULAR CATHETERIZATION Right 09/01/2014   Procedure: A/V Shunt Intervention;  Surgeon: Algernon Huxley, MD;  Location: [place] CV LAB;  Service: Cardiovascular;  Laterality: Right;  . PERIPHERAL VASCULAR CATHETERIZATION N/A 09/17/2014   Procedure: A/V Shuntogram/Fistulagram;  Surgeon: Algernon Huxley, MD;  Location: [place] CV LAB;  Service: Cardiovascular;  Laterality: N/A;  . PERIPHERAL VASCULAR CATHETERIZATION Right 10/17/2014   Procedure: Thrombectomy;  Surgeon: Katha Cabal, MD;  Location: [place] CV LAB;  Service: Cardiovascular;  Laterality: Right;  . PERIPHERAL VASCULAR CATHETERIZATION N/A 10/17/2014   Procedure: A/V Shuntogram/Fistulagram;  Surgeon: Katha Cabal, MD;  Location: [place] CV LAB;  Service: Cardiovascular;  Laterality: N/A;  . PERIPHERAL VASCULAR CATHETERIZATION N/A 10/17/2014   Procedure: A/V Shunt Intervention;  Surgeon: Katha Cabal, MD;  Location: [place] CV LAB;  Service: Cardiovascular;  Laterality: N/A;  . PERIPHERAL VASCULAR CATHETERIZATION Right 11/04/2014   Procedure: Thrombectomy;  Surgeon: Katha Cabal, MD;  Location: [place] CV LAB;  Service: Cardiovascular;  Laterality: Right;  . PERIPHERAL VASCULAR CATHETERIZATION Left 11/04/2014   Procedure: Visceral Venography;  Surgeon: Katha Cabal, MD;  Location: [place] CV LAB;  Service: Cardiovascular;  Laterality: Left;  . PERIPHERAL VASCULAR CATHETERIZATION Right 11/27/2014   Procedure: A/V Shuntogram/Fistulagram;  Surgeon: Algernon Huxley, MD;  Location: [place] CV LAB;  Service: Cardiovascular;  Laterality: Right;  . PERIPHERAL VASCULAR CATHETERIZATION Right 11/27/2014   Procedure: A/V Shunt Intervention;  Surgeon: Algernon Huxley, MD;  Location: [place] CV LAB;  Service: Cardiovascular;  Laterality: Right;  . PERIPHERAL VASCULAR CATHETERIZATION Right 12/31/2014   Procedure: Thrombectomy;  Surgeon: Algernon Huxley, MD;  Location: [place] CV LAB;  Service: Cardiovascular;  Laterality: Right;  . PERIPHERAL VASCULAR CATHETERIZATION Right 01/12/2015   Procedure: A/V Shuntogram/Fistulagram;  Surgeon: Algernon Huxley, MD;  Location: [place] CV LAB;  Service: Cardiovascular;  Laterality: Right;  . PERIPHERAL VASCULAR CATHETERIZATION N/A 01/12/2015   Procedure: A/V Shunt Intervention;  Surgeon: Algernon Huxley, MD;  Location: [place] CV LAB;  Service: Cardiovascular;  Laterality: N/A;  . PERIPHERAL VASCULAR CATHETERIZATION Right 01/28/2015   Procedure: Thrombectomy;  Surgeon: Algernon Huxley, MD;  Location: [place] CV LAB;  Service: Cardiovascular;  Laterality: Right;  . PERIPHERAL VASCULAR CATHETERIZATION N/A 06/08/2015   Procedure: A/V Shuntogram/Fistulagram;  Surgeon: Algernon Huxley, MD;  Location: [place] CV LAB;  Service: Cardiovascular;  Laterality: N/A;  . PERIPHERAL VASCULAR CATHETERIZATION N/A 06/08/2015   Procedure: A/V Shunt Intervention;  Surgeon: Algernon Huxley, MD;  Location: [place] CV LAB;  Service: Cardiovascular;  Laterality: N/A;  . PERIPHERAL VASCULAR CATHETERIZATION Right 07/06/2015  Procedure: A/V Shuntogram/Fistulagram;  Surgeon: Algernon Huxley, MD;  Location: [place] CV LAB;  Service: Cardiovascular;  Laterality: Right;  . PERIPHERAL VASCULAR CATHETERIZATION N/A 07/06/2015   Procedure: A/V Shunt Intervention;  Surgeon: Algernon Huxley, MD;  Location: [place] CV LAB;  Service: Cardiovascular;  Laterality: N/A;  . PERIPHERAL VASCULAR CATHETERIZATION N/A 08/04/2015   Procedure: graft declot;  Surgeon: Katha Cabal, MD;  Location: [place] CV LAB;  Service: Cardiovascular;  Laterality: N/A;  . PERIPHERAL VASCULAR CATHETERIZATION Right 08/25/2015   Procedure: A/V Shuntogram/Fistulagram;  Surgeon: Katha Cabal, MD;  Location: [place] CV LAB;  Service: Cardiovascular;  Laterality: Right;  . PERIPHERAL VASCULAR CATHETERIZATION N/A 11/04/2015   Procedure: Dialysis/Perma Catheter  Insertion;  Surgeon: Algernon Huxley, MD;  Location: [place] CV LAB;  Service: Cardiovascular;  Laterality: N/A;  . PERIPHERAL VASCULAR CATHETERIZATION Left 12/14/2015   Procedure: A/V Shuntogram/Fistulagram;  Surgeon: Algernon Huxley, MD;  Location: [place] CV LAB;  Service: Cardiovascular;  Laterality: Left;  . PERIPHERAL VASCULAR CATHETERIZATION N/A 12/14/2015   Procedure: A/V Shunt Intervention;  Surgeon: Algernon Huxley, MD;  Location: [place] CV LAB;  Service: Cardiovascular;  Laterality: N/A;  . PERIPHERAL VASCULAR CATHETERIZATION N/A 12/17/2015   Procedure: Dialysis/Perma Catheter Insertion;  Surgeon: Algernon Huxley, MD;  Location: [place] CV LAB;  Service: Cardiovascular;  Laterality: N/A;  . PERIPHERAL VASCULAR CATHETERIZATION Left 12/22/2015   Procedure: Dialysis/Perma Catheter Insertion;  Surgeon: Katha Cabal, MD;  Location: [place] CV LAB;  Service: Cardiovascular;  Laterality: Left;  . PERIPHERAL VASCULAR CATHETERIZATION Left 02/29/2016   Procedure: A/V Shuntogram/Fistulagram;  Surgeon: Algernon Huxley, MD;  Location: [place] CV LAB;  Service: Cardiovascular;  Laterality: Left;  . PERIPHERAL VASCULAR CATHETERIZATION N/A 02/29/2016   Procedure: A/V Shunt Intervention;  Surgeon: Algernon Huxley, MD;  Location: [place] CV LAB;  Service: Cardiovascular;  Laterality: N/A;  . right arm graft     for dyalisis  . THROMBECTOMY    . THROMBECTOMY W/ EMBOLECTOMY  03/01/2011   Procedure: THROMBECTOMY ARTERIOVENOUS GORE-TEX GRAFT;  Surgeon: Elam Dutch, MD;  Location: [place];  Service: Vascular;  Laterality: Right;  Attempted Thrombectomy of Old  Right Upper Arm Arteriovenous gortex Graft. Insertion of new Arteriovenous Graft using 63mm x 50cm Gortex Stretch graft.   . THROMBECTOMY W/ EMBOLECTOMY  07/11/2011   Procedure: THROMBECTOMY ARTERIOVENOUS GORE-TEX GRAFT;  Surgeon: Rosetta Posner, MD;  Location: [place];  Service: Vascular;  Laterality: Right;  . UMBILICAL HERNIA REPAIR     . VENOGRAM N/A 08/23/2011   Procedure: VENOGRAM;  Surgeon: [name], MD;  Location: [facility] CATH LAB;  Service: Cardiovascular;  Laterality: N/A;        Home Medications    Prior to Admission medications   Medication Sig Start Date End Date Taking? Authorizing Provider  Amino Acid Infusion (PROSOL) 20 % SOLN  06/30/17  Yes [provider]  apixaban (ELIQUIS) 5 MG TABS tablet Take 1 tablet (5 mg total) 2 (two) times daily by mouth. 02/23/17  Yes Algernon Huxley, MD  aspirin EC 81 MG tablet Take 81 mg by mouth every morning.   Yes [provider]  B Complex-C-Folic Acid (NEPHRO-VITE PO) Take 1 tablet by mouth daily.     Yes [provider]  cinacalcet (SENSIPAR) 90 MG tablet Take 90 mg by mouth daily.   Yes [provider]  FOSRENOL 500 MG chewable tablet Chew 500 mg by mouth 3 (  three) times daily with meals. Reported on 11/04/2015 05/28/13  Yes [provider]  midodrine (PROAMATINE) 5 MG tablet Take 1 tablet by mouth See admin instructions. Pt may take before dialysis treatment, Tues, Thurs and Sat 02/09/16  Yes [provider]  calcium acetate (PHOSLO) 667 MG capsule Take 2,001 mg by mouth See admin instructions. 1334 mg 3 times a day with meals and 667 mg with snacks    [provider]  nitroGLYCERIN (NITROSTAT) 0.4 MG SL tablet Place 1 tablet (0.4 mg total) under the tongue every 5 (five) minutes as needed. For chest 05/30/13   Liliane Shi, PA-C    Family History Family History  Problem Relation Age of Onset  . Hypertension Mother   . Heart disease Mother   . Hypertension Father   . Heart disease Father     Social History Social History   Tobacco Use  . Smoking status: Former Smoker    Types: Cigarettes    Last attempt to quit: 04/18/1994    Years since quitting: 23.2  . Smokeless tobacco: Current User    Types: Snuff  . Tobacco comment: dips 1/2 snuff per day times 25 years  Substance Use Topics  . Alcohol use:  No  . Drug use: No     Allergies   Patient has no known allergies.   Review of Systems Review of Systems  Constitutional: Negative for activity change.       All ROS Neg except as noted in HPI  HENT: Negative for nosebleeds.   Eyes: Negative for photophobia and discharge.  Respiratory: Positive for cough. Negative for shortness of breath and wheezing.   Cardiovascular: Negative for chest pain and palpitations.  Gastrointestinal: Negative for abdominal pain and blood in stool.  Genitourinary: Negative for dysuria, frequency and hematuria.  Musculoskeletal: Negative for arthralgias, back pain and neck pain.  Skin: Negative.   Neurological: Negative for dizziness, seizures and speech difficulty.  Psychiatric/Behavioral: Negative for confusion and hallucinations.     Physical Exam Updated Vital Signs BP 106/70   Pulse (!) 109   Temp 98.3 F (36.8 C) (Oral)   Resp (!) 23   Wt (!) 148.3 kg (327 lb)   SpO2 93%   BMI 44.35 kg/m   Physical Exam  Constitutional: He is oriented to person, place, and time. He appears well-developed and well-nourished.  Non-toxic appearance.  HENT:  Head: Normocephalic.  Right Ear: Tympanic membrane and external ear normal.  Left Ear: Tympanic membrane and external ear normal.  Eyes: Pupils are equal, round, and reactive to light. EOM and lids are normal.  Neck: Normal range of motion. Neck supple. Carotid bruit is not present.  Cardiovascular: Regular rhythm, normal heart sounds, intact distal pulses and normal pulses.  Extrasystoles are present. Tachycardia present.  Pulmonary/Chest: Effort normal and breath sounds normal. No stridor. No respiratory distress. He has no wheezes. He has no rales.  Abdominal: Soft. Bowel sounds are normal. There is no tenderness. There is no guarding.  Musculoskeletal: Normal range of motion. He exhibits no edema.  Lymphadenopathy:       Head (right side): No submandibular adenopathy present.       Head (left  side): No submandibular adenopathy present.    He has no cervical adenopathy.  Neurological: He is alert and oriented to person, place, and time. He has normal strength. No cranial nerve deficit or sensory deficit.  Skin: Skin is warm and dry.  Psychiatric: He has a normal mood and  affect. His speech is normal.  Nursing note and vitals reviewed.    ED Treatments / Results  Labs (all labs ordered are listed, but only abnormal results are displayed) Labs Reviewed  CBC WITH DIFFERENTIAL/PLATELET - Abnormal; Notable for the following components:      Result Value   Hemoglobin 12.2 (*)    MCH 25.2 (*)    All other components within normal limits  COMPREHENSIVE METABOLIC PANEL - Abnormal; Notable for the following components:   Chloride 98 (*)    BUN 37 (*)    Creatinine, Ser 8.72 (*)    Albumin 3.3 (*)    ALT 11 (*)    GFR calc non Af Amer 6 (*)    GFR calc Af Amer 7 (*)    Anion gap 16 (*)    All other components within normal limits  MAGNESIUM    EKG EKG Interpretation  Date/Time:  Friday July 14 2017 12:27:15 EDT Ventricular Rate:  106 PR Interval:    QRS Duration: 90 QT Interval:  395 QTC Calculation: 525 R Axis:   31 Text Interpretation:  Sinus tachycardia Atrial premature complexes Probable left atrial enlargement Low voltage, precordial leads Probable anteroseptal infarct, old Prolonged QT interval Artifact When compared with ECG of 11/13/2015 QT has lengthened Rate faster Confirmed by Francine Graven [id number]) on 07/14/2017 12:37:42 PM   Radiology Dg Chest 2 View  Result Date: 07/14/2017 CLINICAL DATA:  Nonproductive cough for 6 weeks. History of CHF. Dialysis patient. EXAM: CHEST - 2 VIEW COMPARISON:  Chest x-ray 06/12/2017 FINDINGS: The heart is upper limits of normal in size and appears stable. There is tortuosity of the thoracic aorta. Prominent pulmonary hila appear stable and are likely due to prominent pulmonary arteries. Mild central vascular congestion  without pulmonary edema or pleural effusions. No acute pulmonary findings. Mild eventration of both hemidiaphragms. The bony thorax is intact. IMPRESSION: Mild central vascular congestion without overt pulmonary edema or pleural effusion. Electronically Signed   By: Marijo Sanes M.D.   On: 07/14/2017 11:37    Procedures Procedures (including critical care time)  Medications Ordered in ED Medications - No data to display   Initial Impression / Assessment and Plan / ED Course  I have reviewed the triage vital signs and the nursing notes.  Pertinent labs & imaging results that were available during my care of the patient were reviewed by me and considered in my medical decision making (see chart for details).       Final Clinical Impressions(s) / ED Diagnoses MDm  Heart rate elevated.  Review of previous records shows the patient has had elevated heart rate for quite some time.  Pulse oximetry is 93-96% on room air.  Chest x-ray reveals some mild central venous congestion without pulmonary edema or pleural effusion.  No consolidation noted.  Magnesium is normal at 1.9.  The conference of metabolic panel shows the electrolytes to be within normal limits.  The BUN is 37 and the creatinine is 8.72.  The patient is scheduled for dialysis on tomorrow.  Liver functions are nonacute.  The anion gap is 16, just barely above the normal.  The complete blood count is nonacute.  The electrocardiogram shows a sinus tachycardia of 106 with atrial fibrillation present.  There is also some prolongation of the QT.  No coughing noted during the emergency department visit during my examination or during my rechecks.  I have discussed with the patient and family extensively on the findings on the examination,  as well as the laboratory and EKG and x-ray findings.  I have asked the patient to continue this discussion with his primary physician and/or cardiologist.  I have also asked him to consider discussing  with his primary physician if he may need to be tested for sleep apnea.  Questions were answered.  Feel that it is safe for the patient to be discharged home.  Patient will return to the emergency department immediately if any changes in his condition, problems, or concerns.   Final diagnoses:  Cough  History of CHF (congestive heart failure)    ED Discharge Orders    None       Lily Kocher, PA-C 07/14/17 [place], [place], DO 07/16/17 1449

## 2017-07-15 DIAGNOSIS — Z992 Dependence on renal dialysis: Secondary | ICD-10-CM | POA: Diagnosis not present

## 2017-07-15 DIAGNOSIS — E611 Iron deficiency: Secondary | ICD-10-CM | POA: Diagnosis not present

## 2017-07-15 DIAGNOSIS — N186 End stage renal disease: Secondary | ICD-10-CM | POA: Diagnosis not present

## 2017-07-15 DIAGNOSIS — N2581 Secondary hyperparathyroidism of renal origin: Secondary | ICD-10-CM | POA: Diagnosis not present

## 2017-07-16 DIAGNOSIS — Z992 Dependence on renal dialysis: Secondary | ICD-10-CM | POA: Diagnosis not present

## 2017-07-16 DIAGNOSIS — N186 End stage renal disease: Secondary | ICD-10-CM | POA: Diagnosis not present

## 2017-07-18 DIAGNOSIS — N186 End stage renal disease: Secondary | ICD-10-CM | POA: Diagnosis not present

## 2017-07-18 DIAGNOSIS — N2581 Secondary hyperparathyroidism of renal origin: Secondary | ICD-10-CM | POA: Diagnosis not present

## 2017-07-18 DIAGNOSIS — D509 Iron deficiency anemia, unspecified: Secondary | ICD-10-CM | POA: Diagnosis not present

## 2017-07-18 DIAGNOSIS — Z992 Dependence on renal dialysis: Secondary | ICD-10-CM | POA: Diagnosis not present

## 2017-07-18 DIAGNOSIS — E611 Iron deficiency: Secondary | ICD-10-CM | POA: Diagnosis not present

## 2017-07-20 ENCOUNTER — Other Ambulatory Visit: Payer: Self-pay

## 2017-07-20 ENCOUNTER — Ambulatory Visit (INDEPENDENT_AMBULATORY_CARE_PROVIDER_SITE_OTHER): Payer: Medicare Other | Admitting: Cardiovascular Disease

## 2017-07-20 ENCOUNTER — Encounter: Payer: Self-pay | Admitting: Cardiovascular Disease

## 2017-07-20 VITALS — BP 114/70 | HR 110 | Ht 72.0 in | Wt 326.0 lb

## 2017-07-20 DIAGNOSIS — T85868S Thrombosis due to other internal prosthetic devices, implants and grafts, sequela: Secondary | ICD-10-CM | POA: Diagnosis not present

## 2017-07-20 DIAGNOSIS — Z992 Dependence on renal dialysis: Secondary | ICD-10-CM

## 2017-07-20 DIAGNOSIS — G4730 Sleep apnea, unspecified: Secondary | ICD-10-CM

## 2017-07-20 DIAGNOSIS — E611 Iron deficiency: Secondary | ICD-10-CM | POA: Diagnosis not present

## 2017-07-20 DIAGNOSIS — R0600 Dyspnea, unspecified: Secondary | ICD-10-CM | POA: Diagnosis not present

## 2017-07-20 DIAGNOSIS — R059 Cough, unspecified: Secondary | ICD-10-CM

## 2017-07-20 DIAGNOSIS — R0601 Orthopnea: Secondary | ICD-10-CM | POA: Diagnosis not present

## 2017-07-20 DIAGNOSIS — R05 Cough: Secondary | ICD-10-CM

## 2017-07-20 DIAGNOSIS — D509 Iron deficiency anemia, unspecified: Secondary | ICD-10-CM | POA: Diagnosis not present

## 2017-07-20 DIAGNOSIS — N2581 Secondary hyperparathyroidism of renal origin: Secondary | ICD-10-CM | POA: Diagnosis not present

## 2017-07-20 DIAGNOSIS — N186 End stage renal disease: Secondary | ICD-10-CM | POA: Diagnosis not present

## 2017-07-20 NOTE — Progress Notes (Signed)
CARDIOLOGY CONSULT NOTE  Patient ID: [name] [name] MRN: [id number] DOB/AGE: 12/15/1965 52 y.o.  Admit date: (Not on file) Primary Physician: System, Provider Not In Referring Physician: Dr. Thurnell Garbe  Reason for Consultation: Tachycardia  HPI: [name] [name] is a 52 y.o. male who is being seen today for the evaluation of tachycardia at the request of Dr. Thurnell Garbe.  He was last evaluated by Dr. Ron Parker in May 2013.  It appears he has no history of documented coronary disease but does have a long-standing history of mild persistent sinus tachycardia.  He had normal left ventricular systolic function, LVEF 03%, in July 2012 with normal regional wall motion. He has end-stage renal disease and is on dialysis.  He was evaluated for a cough in the ED on 07/14/17.  I have personally reviewed all documentation, labs, radiographic and cardiovascular studies, and independently interpreted all ECG's.  Chest x-ray showed mild central venous congestion without frank pulmonary edema or pleural effusions.  ECG which I personally reviewed performed on 07/14/17 showed sinus tachycardia with PACs, 106 bpm.  He is here with his wife.  He does not have a PCP and has not seen one in over 17 years.  He sees Dr. Lowanda Foster for renal problems.  He dialyzes every Tuesday, Thursday, and Saturday.  He said when he lies down he has a cough.  He is unable to lie down on his back.  If he lies on his left side he can breathe for a while but then has to sit up due to a cough.  He may have paroxysmal nocturnal dyspnea.  His wife has witnessed apneic episodes.  She believes he has sleep apnea.  He denies chest pain.  He said he urinates very little.  He did say that they have been trying to pull off an extra half kilogram with dialysis over the last few runs.  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  He also takes aspirin 81 mg.  No Known  Allergies  Current Outpatient Medications  Medication Sig Dispense Refill  . Amino Acid Infusion (PROSOL) 20 % SOLN   51  . apixaban (ELIQUIS) 5 MG TABS tablet Take 1 tablet (5 mg total) 2 (two) times daily by mouth. 60 tablet 3  . aspirin EC 81 MG tablet Take 81 mg by mouth every morning.    . B Complex-C-Folic Acid (NEPHRO-VITE PO) Take 1 tablet by mouth daily.      . benzonatate (TESSALON) 100 MG capsule 2 po tid for cough 30 capsule 0  . calcium acetate (PHOSLO) 667 MG capsule Take 2,001 mg by mouth See admin instructions. 1334 mg 3 times a day with meals and 667 mg with snacks    . cinacalcet (SENSIPAR) 90 MG tablet Take 90 mg by mouth daily.    [name] FOSRENOL 500 MG chewable tablet Chew 500 mg by mouth 3 (three) times daily with meals. Reported on 11/04/2015    . midodrine (PROAMATINE) 5 MG tablet Take 1 tablet by mouth See admin instructions. Pt may take before dialysis treatment, Tues, Thurs and Sat  4  . nitroGLYCERIN (NITROSTAT) 0.4 MG SL tablet Place 1 tablet (0.4 mg total) under the tongue every 5 (five) minutes as needed. For chest 45 tablet 3   No current facility-administered medications for this visit.     Past Medical History:  Diagnosis Date  . Aneurysm ([place])  Right arm fistula 3 aneurysms 2011,   plans to have a new procedure  . Angina    occasional, last 6 mo ago  . Chest discomfort     2008  . CHF (congestive heart failure) ([place])   . Dialysis patient [facility])    M-W-F @ [place]  . Dysrhythmia   . Ejection fraction     65%, echo, 2008   /   EF 55-60%, echo, October 28, 2010  . ESRD (end stage renal disease) ([place])    on hemodialysis T_T_S  . Hypertension   . Leg pain   . Orthostatic hypotension     2008  . Overweight(278.02)   . Sinus tachycardia     2008, TSH normal ... happens frequently with heart rate at 120-130  . Syncope     positional after dialysis... 2008    Past Surgical History:  Procedure Laterality Date  . A/V FISTULAGRAM Left 06/30/2016    Procedure: A/V Fistulagram;  Surgeon: Algernon Huxley, MD;  Location: [place] [facility];  Service: Cardiovascular;  Laterality: Left;  . ARTERIOVENOUS GRAFT PLACEMENT    . AV FISTULA PLACEMENT    . AV FISTULA PLACEMENT Left 11/18/2015   Procedure: ARTERIOVENOUS (AV) FISTULA CREATION ( RADIOCEPHALIC );  Surgeon: Algernon Huxley, MD;  Location: [facility];  Service: Vascular;  Laterality: Left;  . AV FISTULA PLACEMENT Left 03/23/2016   Procedure: ARTERIOVENOUS (AV) FISTULA CREATION ( REVISION );  Surgeon: Algernon Huxley, MD;  Location: [facility];  Service: Vascular;  Laterality: Left;  . [place] REMOVAL Left 03/23/2016   Procedure: REMOVAL OF ARTERIOVENOUS GORETEX GRAFT ([place]);  Surgeon: Algernon Huxley, MD;  Location: [facility];  Service: Vascular;  Laterality: Left;  . DIALYSIS/PERMA CATHETER REMOVAL N/A 08/01/2016   Procedure: Dialysis/Perma Catheter Removal;  Surgeon: Algernon Huxley, MD;  Location: [place] [facility];  Service: Cardiovascular;  Laterality: N/A;  . HERNIA REPAIR    . INSERTION OF DIALYSIS CATHETER  03/01/2011   Procedure: INSERTION OF DIALYSIS CATHETER;  Surgeon: Elam Dutch, MD;  Location: [place];  Service: Vascular;  Laterality: Left;  Exchange of Dialysis Catheter to 27cm 15Fr. Arrow Catheter  . PERIPHERAL VASCULAR CATHETERIZATION N/A 09/01/2014   Procedure: A/V Shuntogram/Fistulagram;  Surgeon: Algernon Huxley, MD;  Location: [place] [facility];  Service: Cardiovascular;  Laterality: N/A;  . PERIPHERAL VASCULAR CATHETERIZATION Right 09/01/2014   Procedure: Thrombectomy;  Surgeon: Algernon Huxley, MD;  Location: [place] [facility];  Service: Cardiovascular;  Laterality: Right;  . PERIPHERAL VASCULAR CATHETERIZATION Right 09/01/2014   Procedure: A/V Shunt Intervention;  Surgeon: Algernon Huxley, MD;  Location: [place] [facility];  Service: Cardiovascular;  Laterality: Right;  . PERIPHERAL VASCULAR CATHETERIZATION N/A 09/17/2014   Procedure: A/V Shuntogram/Fistulagram;  Surgeon: Algernon Huxley, MD;  Location:  [place] [facility];  Service: Cardiovascular;  Laterality: N/A;  . PERIPHERAL VASCULAR CATHETERIZATION Right 10/17/2014   Procedure: Thrombectomy;  Surgeon: Katha Cabal, MD;  Location: [place] [facility];  Service: Cardiovascular;  Laterality: Right;  . PERIPHERAL VASCULAR CATHETERIZATION N/A 10/17/2014   Procedure: A/V Shuntogram/Fistulagram;  Surgeon: Katha Cabal, MD;  Location: [place] [facility];  Service: Cardiovascular;  Laterality: N/A;  . PERIPHERAL VASCULAR CATHETERIZATION N/A 10/17/2014   Procedure: A/V Shunt Intervention;  Surgeon: Katha Cabal, MD;  Location: [place] [facility];  Service: Cardiovascular;  Laterality: N/A;  . PERIPHERAL VASCULAR CATHETERIZATION Right 11/04/2014   Procedure: Thrombectomy;  Surgeon: Dolores Lory  Schnier, MD;  Location: [place] [facility];  Service: Cardiovascular;  Laterality: Right;  . PERIPHERAL VASCULAR CATHETERIZATION Left 11/04/2014   Procedure: Visceral Venography;  Surgeon: Katha Cabal, MD;  Location: [place] [facility];  Service: Cardiovascular;  Laterality: Left;  . PERIPHERAL VASCULAR CATHETERIZATION Right 11/27/2014   Procedure: A/V Shuntogram/Fistulagram;  Surgeon: Algernon Huxley, MD;  Location: [place] [facility];  Service: Cardiovascular;  Laterality: Right;  . PERIPHERAL VASCULAR CATHETERIZATION Right 11/27/2014   Procedure: A/V Shunt Intervention;  Surgeon: Algernon Huxley, MD;  Location: [place] [facility];  Service: Cardiovascular;  Laterality: Right;  . PERIPHERAL VASCULAR CATHETERIZATION Right 12/31/2014   Procedure: Thrombectomy;  Surgeon: Algernon Huxley, MD;  Location: [place] [facility];  Service: Cardiovascular;  Laterality: Right;  . PERIPHERAL VASCULAR CATHETERIZATION Right 01/12/2015   Procedure: A/V Shuntogram/Fistulagram;  Surgeon: Algernon Huxley, MD;  Location: [place] [facility];  Service: Cardiovascular;  Laterality: Right;  . PERIPHERAL VASCULAR CATHETERIZATION N/A 01/12/2015   Procedure: A/V Shunt  Intervention;  Surgeon: Algernon Huxley, MD;  Location: [place] [facility];  Service: Cardiovascular;  Laterality: N/A;  . PERIPHERAL VASCULAR CATHETERIZATION Right 01/28/2015   Procedure: Thrombectomy;  Surgeon: Algernon Huxley, MD;  Location: [place] [facility];  Service: Cardiovascular;  Laterality: Right;  . PERIPHERAL VASCULAR CATHETERIZATION N/A 06/08/2015   Procedure: A/V Shuntogram/Fistulagram;  Surgeon: Algernon Huxley, MD;  Location: [place] [facility];  Service: Cardiovascular;  Laterality: N/A;  . PERIPHERAL VASCULAR CATHETERIZATION N/A 06/08/2015   Procedure: A/V Shunt Intervention;  Surgeon: Algernon Huxley, MD;  Location: [place] [facility];  Service: Cardiovascular;  Laterality: N/A;  . PERIPHERAL VASCULAR CATHETERIZATION Right 07/06/2015   Procedure: A/V Shuntogram/Fistulagram;  Surgeon: Algernon Huxley, MD;  Location: [place] [facility];  Service: Cardiovascular;  Laterality: Right;  . PERIPHERAL VASCULAR CATHETERIZATION N/A 07/06/2015   Procedure: A/V Shunt Intervention;  Surgeon: Algernon Huxley, MD;  Location: [place] [facility];  Service: Cardiovascular;  Laterality: N/A;  . PERIPHERAL VASCULAR CATHETERIZATION N/A 08/04/2015   Procedure: graft declot;  Surgeon: Katha Cabal, MD;  Location: [place] [facility];  Service: Cardiovascular;  Laterality: N/A;  . PERIPHERAL VASCULAR CATHETERIZATION Right 08/25/2015   Procedure: A/V Shuntogram/Fistulagram;  Surgeon: Katha Cabal, MD;  Location: [place] [facility];  Service: Cardiovascular;  Laterality: Right;  . PERIPHERAL VASCULAR CATHETERIZATION N/A 11/04/2015   Procedure: Dialysis/Perma Catheter Insertion;  Surgeon: Algernon Huxley, MD;  Location: [place] [facility];  Service: Cardiovascular;  Laterality: N/A;  . PERIPHERAL VASCULAR CATHETERIZATION Left 12/14/2015   Procedure: A/V Shuntogram/Fistulagram;  Surgeon: Algernon Huxley, MD;  Location: [place] [facility];  Service: Cardiovascular;  Laterality: Left;  . PERIPHERAL VASCULAR  CATHETERIZATION N/A 12/14/2015   Procedure: A/V Shunt Intervention;  Surgeon: Algernon Huxley, MD;  Location: [place] [facility];  Service: Cardiovascular;  Laterality: N/A;  . PERIPHERAL VASCULAR CATHETERIZATION N/A 12/17/2015   Procedure: Dialysis/Perma Catheter Insertion;  Surgeon: Algernon Huxley, MD;  Location: [place] [facility];  Service: Cardiovascular;  Laterality: N/A;  . PERIPHERAL VASCULAR CATHETERIZATION Left 12/22/2015   Procedure: Dialysis/Perma Catheter Insertion;  Surgeon: Katha Cabal, MD;  Location: [place] [facility];  Service: Cardiovascular;  Laterality: Left;  . PERIPHERAL VASCULAR CATHETERIZATION Left 02/29/2016   Procedure: A/V Shuntogram/Fistulagram;  Surgeon: Algernon Huxley, MD;  Location: [place] [facility];  Service: Cardiovascular;  Laterality: Left;  . PERIPHERAL VASCULAR CATHETERIZATION N/A 02/29/2016  Procedure: A/V Shunt Intervention;  Surgeon: Algernon Huxley, MD;  Location: [place] [facility];  Service: Cardiovascular;  Laterality: N/A;  . right arm graft     for dyalisis  . THROMBECTOMY    . THROMBECTOMY W/ EMBOLECTOMY  03/01/2011   Procedure: THROMBECTOMY ARTERIOVENOUS GORE-TEX GRAFT;  Surgeon: Elam Dutch, MD;  Location: [place];  Service: Vascular;  Laterality: Right;  Attempted Thrombectomy of Old  Right Upper Arm Arteriovenous gortex Graft. Insertion of new Arteriovenous Graft using 2mm x 50cm Gortex Stretch graft.   . THROMBECTOMY W/ EMBOLECTOMY  07/11/2011   Procedure: THROMBECTOMY ARTERIOVENOUS GORE-TEX GRAFT;  Surgeon: Rosetta Posner, MD;  Location: [place];  Service: Vascular;  Laterality: Right;  . UMBILICAL HERNIA REPAIR    . VENOGRAM N/A 08/23/2011   Procedure: VENOGRAM;  Surgeon: [name], MD;  Location: [facility] CATH LAB;  Service: Cardiovascular;  Laterality: N/A;    Social History   Socioeconomic History  . Marital status: Married    Spouse name: Not on file  . Number of children: Not on file  . Years of education: Not on file  . Highest  education level: Not on file  Occupational History  . Not on file  Social Needs  . Financial resource strain: Not on file  . Food insecurity:    Worry: Not on file    Inability: Not on file  . Transportation needs:    Medical: Not on file    Non-medical: Not on file  Tobacco Use  . Smoking status: Former Smoker    Types: Cigarettes    Last attempt to quit: 04/18/1994    Years since quitting: 23.2  . Smokeless tobacco: Current User    Types: Snuff  . Tobacco comment: dips 1/2 snuff per day times 25 years  Substance and Sexual Activity  . Alcohol use: No  . Drug use: No  . Sexual activity: Not on file  Lifestyle  . Physical activity:    Days per week: Not on file    Minutes per session: Not on file  . Stress: Not on file  Relationships  . Social connections:    Talks on phone: Not on file    Gets together: Not on file    Attends religious service: Not on file    Active member of club or organization: Not on file    Attends meetings of clubs or organizations: Not on file    Relationship status: Not on file  . Intimate partner violence:    Fear of current or ex partner: Not on file    Emotionally abused: Not on file    Physically abused: Not on file    Forced sexual activity: Not on file  Other Topics Concern  . Not on file  Social History Narrative   Single     No family history of premature CAD in 1st degree relatives.  Current Meds  Medication Sig  . Amino Acid Infusion (PROSOL) 20 % SOLN   . apixaban (ELIQUIS) 5 MG TABS tablet Take 1 tablet (5 mg total) 2 (two) times daily by mouth.  [name] aspirin EC 81 MG tablet Take 81 mg by mouth every morning.  . B Complex-C-Folic Acid (NEPHRO-VITE PO) Take 1 tablet by mouth daily.    . benzonatate (TESSALON) 100 MG capsule 2 po tid for cough  . calcium acetate (PHOSLO) 667 MG capsule Take 2,001 mg by mouth See admin instructions. 1334 mg 3 times a day with meals and  667 mg with snacks  . cinacalcet (SENSIPAR) 90 MG tablet Take  90 mg by mouth daily.  [name] FOSRENOL 500 MG chewable tablet Chew 500 mg by mouth 3 (three) times daily with meals. Reported on 11/04/2015  . midodrine (PROAMATINE) 5 MG tablet Take 1 tablet by mouth See admin instructions. Pt may take before dialysis treatment, Tues, Thurs and Sat  . nitroGLYCERIN (NITROSTAT) 0.4 MG SL tablet Place 1 tablet (0.4 mg total) under the tongue every 5 (five) minutes as needed. For chest      Review of systems complete and found to be negative unless listed above in HPI    Physical exam Blood pressure 114/70, pulse (!) 110, height 6' (1.829 m), weight (!) 326 lb (147.9 kg), SpO2 94 %. General: NAD Neck: No JVD, no thyromegaly or thyroid nodule.  Lungs: Clear to auscultation bilaterally with normal respiratory effort. CV: Nondisplaced PMI.  Mildly tachycardic, regular rhythm, normal S1/S2, no S3/S4, no murmur.  No peripheral edema.  Abdomen: Soft, nontender, no distention.  Skin: Intact without lesions or rashes.  Neurologic: Alert and oriented x 3.  Psych: Normal affect. Extremities: No clubbing or cyanosis.  HEENT: Normal.   ECG: Most recent ECG reviewed.   Labs: Lab Results  Component Value Date/Time   K 3.8 07/14/2017 12:14 PM   K 4.1 11/13/2013 04:08 AM   BUN 37 (H) 07/14/2017 12:14 PM   BUN 68 (H) 11/13/2013 04:08 AM   CREATININE 8.72 (H) 07/14/2017 12:14 PM   CREATININE 16.45 (H) 11/13/2013 04:08 AM   ALT 11 (L) 07/14/2017 12:14 PM   ALT < 6 (L) 05/16/2013 06:16 AM   TSH 1.66 05/16/2013 06:16 AM   HGB 12.2 (L) 07/14/2017 12:14 PM   HGB 11.3 (L) 11/13/2013 04:08 AM     Lipids: Lab Results  Component Value Date/Time   LDLCALC 115 (H) 05/16/2013 06:16 AM   CHOL 169 05/16/2013 06:16 AM   TRIG 97 05/16/2013 06:16 AM   HDL 35 (L) 05/16/2013 06:16 AM        ASSESSMENT AND PLAN:   1.  Cough with orthopnea and possible PND: I wonder if he has developed cardiac dysfunction given long-standing persistent tachycardia. I will order a 2-D  echocardiogram with Doppler to evaluate cardiac structure, function, and regional wall motion. If LV function is compromised, I would consider a beta-blocker.  2.  Sleep disordered breathing: I will obtain a sleep study as he has had witnessed apneic episodes by his wife.  I informed him that sleep apnea is an independent risk factor for myocardial infarction, arrhythmias, and strokes.  3.  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  4.  End-stage renal disease on hemodialysis.   Disposition: Follow up in 3 months   Signed: Kate Sable, M.D., F.A.C.C.  07/20/2017, 2:34 PM

## 2017-07-20 NOTE — Patient Instructions (Signed)
Your physician recommends that you schedule a follow-up appointment in: [place]  Your physician recommends that you continue on your current medications as directed. Please refer to the Current Medication list given to you today.  Your physician has requested that you have an echocardiogram. Echocardiography is a painless test that uses sound waves to create images of your heart. It provides your doctor with information about the size and shape of your heart and how well your heart's chambers and valves are working. This procedure takes approximately one hour. There are no restrictions for this procedure.  You have been referred to DR [facility]   Thank you for choosing [facility]!!

## 2017-07-22 DIAGNOSIS — N186 End stage renal disease: Secondary | ICD-10-CM | POA: Diagnosis not present

## 2017-07-22 DIAGNOSIS — Z992 Dependence on renal dialysis: Secondary | ICD-10-CM | POA: Diagnosis not present

## 2017-07-22 DIAGNOSIS — E611 Iron deficiency: Secondary | ICD-10-CM | POA: Diagnosis not present

## 2017-07-22 DIAGNOSIS — N2581 Secondary hyperparathyroidism of renal origin: Secondary | ICD-10-CM | POA: Diagnosis not present

## 2017-07-22 DIAGNOSIS — D509 Iron deficiency anemia, unspecified: Secondary | ICD-10-CM | POA: Diagnosis not present

## 2017-07-25 DIAGNOSIS — E611 Iron deficiency: Secondary | ICD-10-CM | POA: Diagnosis not present

## 2017-07-25 DIAGNOSIS — D509 Iron deficiency anemia, unspecified: Secondary | ICD-10-CM | POA: Diagnosis not present

## 2017-07-25 DIAGNOSIS — N186 End stage renal disease: Secondary | ICD-10-CM | POA: Diagnosis not present

## 2017-07-25 DIAGNOSIS — Z992 Dependence on renal dialysis: Secondary | ICD-10-CM | POA: Diagnosis not present

## 2017-07-25 DIAGNOSIS — N2581 Secondary hyperparathyroidism of renal origin: Secondary | ICD-10-CM | POA: Diagnosis not present

## 2017-07-27 DIAGNOSIS — Z992 Dependence on renal dialysis: Secondary | ICD-10-CM | POA: Diagnosis not present

## 2017-07-27 DIAGNOSIS — E611 Iron deficiency: Secondary | ICD-10-CM | POA: Diagnosis not present

## 2017-07-27 DIAGNOSIS — N2581 Secondary hyperparathyroidism of renal origin: Secondary | ICD-10-CM | POA: Diagnosis not present

## 2017-07-27 DIAGNOSIS — D509 Iron deficiency anemia, unspecified: Secondary | ICD-10-CM | POA: Diagnosis not present

## 2017-07-27 DIAGNOSIS — N186 End stage renal disease: Secondary | ICD-10-CM | POA: Diagnosis not present

## 2017-07-28 ENCOUNTER — Emergency Department (HOSPITAL_COMMUNITY): Payer: Medicare Other

## 2017-07-28 ENCOUNTER — Inpatient Hospital Stay (HOSPITAL_COMMUNITY)
Admission: EM | Admit: 2017-07-28 | Discharge: 2017-08-03 | DRG: 286 | Disposition: A | Discharge status: Home / Self Care | Payer: Medicare Other | Attending: Internal Medicine | Admitting: Internal Medicine

## 2017-07-28 ENCOUNTER — Other Ambulatory Visit: Payer: Self-pay

## 2017-07-28 ENCOUNTER — Ambulatory Visit (INDEPENDENT_AMBULATORY_CARE_PROVIDER_SITE_OTHER): Payer: Medicare Other

## 2017-07-28 ENCOUNTER — Telehealth: Payer: Self-pay | Admitting: *Deleted

## 2017-07-28 ENCOUNTER — Encounter (HOSPITAL_COMMUNITY): Payer: Self-pay | Admitting: Emergency Medicine

## 2017-07-28 DIAGNOSIS — R0602 Shortness of breath: Secondary | ICD-10-CM | POA: Diagnosis not present

## 2017-07-28 DIAGNOSIS — Z6841 Body Mass Index (BMI) 40.0 and over, adult: Secondary | ICD-10-CM

## 2017-07-28 DIAGNOSIS — N186 End stage renal disease: Secondary | ICD-10-CM

## 2017-07-28 DIAGNOSIS — I509 Heart failure, unspecified: Secondary | ICD-10-CM | POA: Diagnosis not present

## 2017-07-28 DIAGNOSIS — Z8249 Family history of ischemic heart disease and other diseases of the circulatory system: Secondary | ICD-10-CM

## 2017-07-28 DIAGNOSIS — I471 Supraventricular tachycardia: Secondary | ICD-10-CM | POA: Diagnosis not present

## 2017-07-28 DIAGNOSIS — Z7901 Long term (current) use of anticoagulants: Secondary | ICD-10-CM

## 2017-07-28 DIAGNOSIS — R000 Tachycardia, unspecified: Secondary | ICD-10-CM | POA: Diagnosis not present

## 2017-07-28 DIAGNOSIS — D631 Anemia in chronic kidney disease: Secondary | ICD-10-CM | POA: Diagnosis present

## 2017-07-28 DIAGNOSIS — Z79899 Other long term (current) drug therapy: Secondary | ICD-10-CM

## 2017-07-28 DIAGNOSIS — Z87891 Personal history of nicotine dependence: Secondary | ICD-10-CM

## 2017-07-28 DIAGNOSIS — Q248 Other specified congenital malformations of heart: Secondary | ICD-10-CM

## 2017-07-28 DIAGNOSIS — I110 Hypertensive heart disease with heart failure: Secondary | ICD-10-CM | POA: Diagnosis not present

## 2017-07-28 DIAGNOSIS — I959 Hypotension, unspecified: Secondary | ICD-10-CM | POA: Diagnosis present

## 2017-07-28 DIAGNOSIS — I2510 Atherosclerotic heart disease of native coronary artery without angina pectoris: Secondary | ICD-10-CM | POA: Diagnosis present

## 2017-07-28 DIAGNOSIS — R931 Abnormal findings on diagnostic imaging of heart and coronary circulation: Secondary | ICD-10-CM | POA: Diagnosis not present

## 2017-07-28 DIAGNOSIS — K083 Retained dental root: Secondary | ICD-10-CM | POA: Diagnosis present

## 2017-07-28 DIAGNOSIS — I5043 Acute on chronic combined systolic (congestive) and diastolic (congestive) heart failure: Secondary | ICD-10-CM | POA: Diagnosis not present

## 2017-07-28 DIAGNOSIS — I080 Rheumatic disorders of both mitral and aortic valves: Secondary | ICD-10-CM | POA: Diagnosis present

## 2017-07-28 DIAGNOSIS — I358 Other nonrheumatic aortic valve disorders: Secondary | ICD-10-CM | POA: Diagnosis not present

## 2017-07-28 DIAGNOSIS — R0601 Orthopnea: Secondary | ICD-10-CM

## 2017-07-28 DIAGNOSIS — Z992 Dependence on renal dialysis: Secondary | ICD-10-CM

## 2017-07-28 DIAGNOSIS — K089 Disorder of teeth and supporting structures, unspecified: Secondary | ICD-10-CM

## 2017-07-28 DIAGNOSIS — I429 Cardiomyopathy, unspecified: Secondary | ICD-10-CM | POA: Diagnosis present

## 2017-07-28 DIAGNOSIS — T82868D Thrombosis of vascular prosthetic devices, implants and grafts, subsequent encounter: Secondary | ICD-10-CM | POA: Insufficient documentation

## 2017-07-28 DIAGNOSIS — I132 Hypertensive heart and chronic kidney disease with heart failure and with stage 5 chronic kidney disease, or end stage renal disease: Principal | ICD-10-CM | POA: Diagnosis present

## 2017-07-28 DIAGNOSIS — E662 Morbid (severe) obesity with alveolar hypoventilation: Secondary | ICD-10-CM | POA: Diagnosis present

## 2017-07-28 DIAGNOSIS — I351 Nonrheumatic aortic (valve) insufficiency: Secondary | ICD-10-CM | POA: Diagnosis present

## 2017-07-28 DIAGNOSIS — K029 Dental caries, unspecified: Secondary | ICD-10-CM | POA: Diagnosis present

## 2017-07-28 DIAGNOSIS — I5020 Unspecified systolic (congestive) heart failure: Secondary | ICD-10-CM | POA: Diagnosis present

## 2017-07-28 DIAGNOSIS — K045 Chronic apical periodontitis: Secondary | ICD-10-CM | POA: Diagnosis present

## 2017-07-28 DIAGNOSIS — I340 Nonrheumatic mitral (valve) insufficiency: Secondary | ICD-10-CM | POA: Diagnosis present

## 2017-07-28 DIAGNOSIS — Z7982 Long term (current) use of aspirin: Secondary | ICD-10-CM

## 2017-07-28 HISTORY — DX: Nonrheumatic mitral (valve) insufficiency: I34.0

## 2017-07-28 HISTORY — DX: Nonrheumatic aortic (valve) insufficiency: I35.1

## 2017-07-28 HISTORY — DX: Morbid (severe) obesity due to excess calories: E66.01

## 2017-07-28 LAB — CBC
HEMATOCRIT: 41.7 % (ref 39.0–52.0)
HEMOGLOBIN: 13.2 g/dL (ref 13.0–17.0)
MCH: 25.8 pg — ABNORMAL LOW (ref 26.0–34.0)
MCHC: 31.7 g/dL (ref 30.0–36.0)
MCV: 81.6 fL (ref 78.0–100.0)
Platelets: 283 10*3/uL (ref 150–400)
RBC: 5.11 MIL/uL (ref 4.22–5.81)
RDW: 15.4 % (ref 11.5–15.5)
WBC: 9.7 10*3/uL (ref 4.0–10.5)

## 2017-07-28 LAB — BASIC METABOLIC PANEL
ANION GAP: 16 — AB (ref 5–15)
BUN: 45 mg/dL — ABNORMAL HIGH (ref 6–20)
CALCIUM: 9.2 mg/dL (ref 8.9–10.3)
CO2: 25 mmol/L (ref 22–32)
Chloride: 100 mmol/L — ABNORMAL LOW (ref 101–111)
Creatinine, Ser: 10.46 mg/dL — ABNORMAL HIGH (ref 0.61–1.24)
GFR, EST AFRICAN AMERICAN: 6 mL/min — AB (ref >60)
GFR, EST NON AFRICAN AMERICAN: 5 mL/min — AB (ref >60)
GLUCOSE: 101 mg/dL — AB (ref 65–99)
POTASSIUM: 4.2 mmol/L (ref 3.5–5.1)
SODIUM: 141 mmol/L (ref 135–145)

## 2017-07-28 LAB — I-STAT TROPONIN, ED: TROPONIN I, POC: 0.01 ng/mL (ref 0.00–0.08)

## 2017-07-28 LAB — BRAIN NATRIURETIC PEPTIDE: B NATRIURETIC PEPTIDE 5: 1098 pg/mL — AB (ref 0.0–100.0)

## 2017-07-28 NOTE — ED Triage Notes (Signed)
Patient told to come to ED by doctor for further evaluation of abnormal echocardiogram done earlier today. Patient reports he had test done for cough x 2 months and chronic tachycardia. Patient denies palpitations, chest pain, dizziness, SOB. Patient is dialysis patient x 17 years, T/TH/SU. Has no complaints at this time.

## 2017-07-28 NOTE — Telephone Encounter (Signed)
Spoke with patient and advised him per Dr. Myles Gip recommendations to go to the ED now at [facility] for abnormal echo. Patient verbalized understanding of plan.  Paged Trish. Awaiting call back

## 2017-07-28 NOTE — ED Provider Notes (Signed)
[place] EMERGENCY DEPARTMENT Provider Note   CSN: [id number] Arrival date & time: 07/28/17  1757     History   Chief Complaint Chief Complaint  Patient presents with  . Abnormal Echocardiogram    HPI [name] [name] is a 52 y.o. male.  Patient sent to the ED by cardiology after abnormal echocardiogram.  Patient with decreased EF.  Has had no fevers, denies IV drug use.  Symptoms have been ongoing for several weeks.  He goes to dialysis 3 times a week and has not missed any sessions.  The history is provided by the patient.  Shortness of Breath  This is a new problem. The problem occurs intermittently.The current episode started more than 1 week ago. The problem has not changed since onset.Associated symptoms include cough, orthopnea and leg swelling. Pertinent negatives include no fever, no sore throat, no ear pain, no sputum production, no chest pain, no syncope, no vomiting, no abdominal pain, no rash, no leg pain and no claudication. He has tried nothing for the symptoms.    Past Medical History:  Diagnosis Date  . Aneurysm ([place])     Right arm fistula 3 aneurysms 2011,   plans to have a new procedure  . Angina    occasional, last 6 mo ago  . Chest discomfort     2008  . CHF (congestive heart failure) ([place])   . Dialysis patient [facility])    M-W-F @ [place]  . Dysrhythmia   . Ejection fraction     65%, echo, 2008   /   EF 55-60%, echo, October 28, 2010  . ESRD (end stage renal disease) ([place])    on hemodialysis [id number]  . Hypertension   . Leg pain   . Orthostatic hypotension     2008  . Overweight(278.02)   . Sinus tachycardia     2008, TSH normal ... happens frequently with heart rate at 120-130  . Syncope     positional after dialysis... 2008    Patient Active Problem List   Diagnosis Date Noted  . 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 Systolic CHF ([place]) 62/22/9798  . 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 Dermatitis  04/06/2016  . End stage renal disease ([place]) 12/22/2011  . Ejection fraction   . Sinus tachycardia   . ESRD (end stage renal disease) ([place])   . Orthostatic hypotension   . Chest discomfort   . Overweight(278.02)   . Syncope   . Aneurysm [facility])     Past Surgical History:  Procedure Laterality Date  . A/V FISTULAGRAM Left 06/30/2016   Procedure: A/V Fistulagram;  Surgeon: Algernon Huxley, MD;  Location: [place] CV LAB;  Service: Cardiovascular;  Laterality: Left;  . ARTERIOVENOUS GRAFT PLACEMENT    . AV FISTULA PLACEMENT    . AV FISTULA PLACEMENT Left 11/18/2015   Procedure: ARTERIOVENOUS (AV) FISTULA CREATION ( RADIOCEPHALIC );  Surgeon: Algernon Huxley, MD;  Location: [facility] ORS;  Service: Vascular;  Laterality: Left;  . AV FISTULA PLACEMENT Left 03/23/2016   Procedure: ARTERIOVENOUS (AV) FISTULA CREATION ( REVISION );  Surgeon: Algernon Huxley, MD;  Location: [facility] ORS;  Service: Vascular;  Laterality: Left;  . [place] REMOVAL Left 03/23/2016   Procedure: REMOVAL OF ARTERIOVENOUS GORETEX GRAFT ([place]);  Surgeon: Algernon Huxley, MD;  Location: [facility] ORS;  Service: Vascular;  Laterality: Left;  . DIALYSIS/PERMA CATHETER REMOVAL N/A 08/01/2016   Procedure: Dialysis/Perma Catheter Removal;  Surgeon: Algernon Huxley, MD;  Location: [place] CV LAB;  Service: Cardiovascular;  Laterality: N/A;  . HERNIA REPAIR    . INSERTION OF DIALYSIS CATHETER  03/01/2011   Procedure: INSERTION OF DIALYSIS CATHETER;  Surgeon: Elam Dutch, MD;  Location: [place];  Service: Vascular;  Laterality: Left;  Exchange of Dialysis Catheter to 27cm 15Fr. Arrow Catheter  . PERIPHERAL VASCULAR CATHETERIZATION N/A 09/01/2014   Procedure: A/V Shuntogram/Fistulagram;  Surgeon: Algernon Huxley, MD;  Location: [place] CV LAB;  Service: Cardiovascular;  Laterality: N/A;  . PERIPHERAL VASCULAR CATHETERIZATION Right 09/01/2014   Procedure: Thrombectomy;  Surgeon: Algernon Huxley, MD;  Location: [place] CV LAB;  Service: Cardiovascular;   Laterality: Right;  . PERIPHERAL VASCULAR CATHETERIZATION Right 09/01/2014   Procedure: A/V Shunt Intervention;  Surgeon: Algernon Huxley, MD;  Location: [place] CV LAB;  Service: Cardiovascular;  Laterality: Right;  . PERIPHERAL VASCULAR CATHETERIZATION N/A 09/17/2014   Procedure: A/V Shuntogram/Fistulagram;  Surgeon: Algernon Huxley, MD;  Location: [place] CV LAB;  Service: Cardiovascular;  Laterality: N/A;  . PERIPHERAL VASCULAR CATHETERIZATION Right 10/17/2014   Procedure: Thrombectomy;  Surgeon: Katha Cabal, MD;  Location: [place] CV LAB;  Service: Cardiovascular;  Laterality: Right;  . PERIPHERAL VASCULAR CATHETERIZATION N/A 10/17/2014   Procedure: A/V Shuntogram/Fistulagram;  Surgeon: Katha Cabal, MD;  Location: [place] CV LAB;  Service: Cardiovascular;  Laterality: N/A;  . PERIPHERAL VASCULAR CATHETERIZATION N/A 10/17/2014   Procedure: A/V Shunt Intervention;  Surgeon: Katha Cabal, MD;  Location: [place] CV LAB;  Service: Cardiovascular;  Laterality: N/A;  . PERIPHERAL VASCULAR CATHETERIZATION Right 11/04/2014   Procedure: Thrombectomy;  Surgeon: Katha Cabal, MD;  Location: [place] CV LAB;  Service: Cardiovascular;  Laterality: Right;  . PERIPHERAL VASCULAR CATHETERIZATION Left 11/04/2014   Procedure: Visceral Venography;  Surgeon: Katha Cabal, MD;  Location: [place] CV LAB;  Service: Cardiovascular;  Laterality: Left;  . PERIPHERAL VASCULAR CATHETERIZATION Right 11/27/2014   Procedure: A/V Shuntogram/Fistulagram;  Surgeon: Algernon Huxley, MD;  Location: [place] CV LAB;  Service: Cardiovascular;  Laterality: Right;  . PERIPHERAL VASCULAR CATHETERIZATION Right 11/27/2014   Procedure: A/V Shunt Intervention;  Surgeon: Algernon Huxley, MD;  Location: [place] CV LAB;  Service: Cardiovascular;  Laterality: Right;  . PERIPHERAL VASCULAR CATHETERIZATION Right 12/31/2014   Procedure: Thrombectomy;  Surgeon: Algernon Huxley, MD;  Location: [place] CV LAB;  Service: Cardiovascular;  Laterality: Right;  . PERIPHERAL VASCULAR CATHETERIZATION Right 01/12/2015   Procedure: A/V Shuntogram/Fistulagram;  Surgeon: Algernon Huxley, MD;  Location: [place] CV LAB;  Service: Cardiovascular;  Laterality: Right;  . PERIPHERAL VASCULAR CATHETERIZATION N/A 01/12/2015   Procedure: A/V Shunt Intervention;  Surgeon: Algernon Huxley, MD;  Location: [place] CV LAB;  Service: Cardiovascular;  Laterality: N/A;  . PERIPHERAL VASCULAR CATHETERIZATION Right 01/28/2015   Procedure: Thrombectomy;  Surgeon: Algernon Huxley, MD;  Location: [place] CV LAB;  Service: Cardiovascular;  Laterality: Right;  . PERIPHERAL VASCULAR CATHETERIZATION N/A 06/08/2015   Procedure: A/V Shuntogram/Fistulagram;  Surgeon: Algernon Huxley, MD;  Location: [place] CV LAB;  Service: Cardiovascular;  Laterality: N/A;  . PERIPHERAL VASCULAR CATHETERIZATION N/A 06/08/2015   Procedure: A/V Shunt Intervention;  Surgeon: Algernon Huxley, MD;  Location: [place] CV LAB;  Service: Cardiovascular;  Laterality: N/A;  . PERIPHERAL VASCULAR CATHETERIZATION Right 07/06/2015   Procedure: A/V Shuntogram/Fistulagram;  Surgeon: Algernon Huxley, MD;  Location: [place] CV LAB;  Service: Cardiovascular;  Laterality: Right;  .  PERIPHERAL VASCULAR CATHETERIZATION N/A 07/06/2015   Procedure: A/V Shunt Intervention;  Surgeon: Algernon Huxley, MD;  Location: [place] CV LAB;  Service: Cardiovascular;  Laterality: N/A;  . PERIPHERAL VASCULAR CATHETERIZATION N/A 08/04/2015   Procedure: graft declot;  Surgeon: Katha Cabal, MD;  Location: [place] CV LAB;  Service: Cardiovascular;  Laterality: N/A;  . PERIPHERAL VASCULAR CATHETERIZATION Right 08/25/2015   Procedure: A/V Shuntogram/Fistulagram;  Surgeon: Katha Cabal, MD;  Location: [place] CV LAB;  Service: Cardiovascular;  Laterality: Right;  . PERIPHERAL VASCULAR CATHETERIZATION N/A 11/04/2015   Procedure: Dialysis/Perma Catheter  Insertion;  Surgeon: Algernon Huxley, MD;  Location: [place] CV LAB;  Service: Cardiovascular;  Laterality: N/A;  . PERIPHERAL VASCULAR CATHETERIZATION Left 12/14/2015   Procedure: A/V Shuntogram/Fistulagram;  Surgeon: Algernon Huxley, MD;  Location: [place] CV LAB;  Service: Cardiovascular;  Laterality: Left;  . PERIPHERAL VASCULAR CATHETERIZATION N/A 12/14/2015   Procedure: A/V Shunt Intervention;  Surgeon: Algernon Huxley, MD;  Location: [place] CV LAB;  Service: Cardiovascular;  Laterality: N/A;  . PERIPHERAL VASCULAR CATHETERIZATION N/A 12/17/2015   Procedure: Dialysis/Perma Catheter Insertion;  Surgeon: Algernon Huxley, MD;  Location: [place] CV LAB;  Service: Cardiovascular;  Laterality: N/A;  . PERIPHERAL VASCULAR CATHETERIZATION Left 12/22/2015   Procedure: Dialysis/Perma Catheter Insertion;  Surgeon: Katha Cabal, MD;  Location: [place] CV LAB;  Service: Cardiovascular;  Laterality: Left;  . PERIPHERAL VASCULAR CATHETERIZATION Left 02/29/2016   Procedure: A/V Shuntogram/Fistulagram;  Surgeon: Algernon Huxley, MD;  Location: [place] CV LAB;  Service: Cardiovascular;  Laterality: Left;  . PERIPHERAL VASCULAR CATHETERIZATION N/A 02/29/2016   Procedure: A/V Shunt Intervention;  Surgeon: Algernon Huxley, MD;  Location: [place] CV LAB;  Service: Cardiovascular;  Laterality: N/A;  . right arm graft     for dyalisis  . THROMBECTOMY    . THROMBECTOMY W/ EMBOLECTOMY  03/01/2011   Procedure: THROMBECTOMY ARTERIOVENOUS GORE-TEX GRAFT;  Surgeon: Elam Dutch, MD;  Location: [place];  Service: Vascular;  Laterality: Right;  Attempted Thrombectomy of Old  Right Upper Arm Arteriovenous gortex Graft. Insertion of new Arteriovenous Graft using 33mm x 50cm Gortex Stretch graft.   . THROMBECTOMY W/ EMBOLECTOMY  07/11/2011   Procedure: THROMBECTOMY ARTERIOVENOUS GORE-TEX GRAFT;  Surgeon: Rosetta Posner, MD;  Location: [place];  Service: Vascular;  Laterality: Right;  . UMBILICAL HERNIA REPAIR     . VENOGRAM N/A 08/23/2011   Procedure: VENOGRAM;  Surgeon: [name], MD;  Location[facility] C[facility];  Service: Cardiovascular;  Laterality: N/A;        Home Medications    Prior to Admission medications   Medication Sig Start Date End Date Taking? Authorizing Provider  apixaban (ELIQUIS) 5 MG TABS tablet Take 1 tablet (5 mg total) 2 (two) times daily by mouth. 02/23/17  Yes Algernon Huxley, MD  aspirin EC 81 MG tablet Take 81 mg by mouth every morning.   Yes [provider]  B Complex-C-Folic Acid (NEPHRO-VITE PO) Take 1 tablet by mouth at bedtime.    Yes [provider]  benzonatate (TESSALON) 100 MG capsule 2 po tid for cough Patient taking differently: Take 200 mg by mouth 2 (two) times daily as needed. 2 po tid for cough 07/14/17  Yes Lily Kocher, PA-C  calcium acetate (PHOSLO) 667 MG capsule Take 2,001 mg by mouth See admin instructions. 1334 mg 3 times a day with meals and 667 mg with snacks   Yes [provider]  cinacalcet (SENSIPAR) 90 MG tablet Take 90 mg by mouth daily.   Yes [provider]  FOSRENOL 500 MG chewable tablet Chew 2,000 mg by mouth See admin instructions. Take 2000 mg with meals and 1000 mg with snacks 05/28/13  Yes [provider]  midodrine (PROAMATINE) 5 MG tablet Take 1 tablet by mouth See admin instructions. Pt may take before dialysis treatment, Tues, Thurs and Sat 02/09/16  Yes [provider]  nitroGLYCERIN (NITROSTAT) 0.4 MG SL tablet Place 1 tablet (0.4 mg total) under the tongue every 5 (five) minutes as needed. For chest 05/30/13  Yes Richardson Dopp T, PA-C    Family History Family History  Problem Relation Age of Onset  . Hypertension Mother   . Heart disease Mother   . Hypertension Father   . Heart disease Father     Social History Social History   Tobacco Use  . Smoking status: Former Smoker    Types: Cigarettes    Last attempt to quit: 04/18/1994    Years since quitting: 23.2  .  Smokeless tobacco: Current User    Types: Snuff  . Tobacco comment: dips 1/2 snuff per day times 25 years  Substance Use Topics  . Alcohol use: No  . Drug use: No     Allergies   Patient has no known allergies.   Review of Systems Review of Systems  Constitutional: Negative for chills and fever.  HENT: Negative for ear pain and sore throat.   Eyes: Negative for pain and visual disturbance.  Respiratory: Positive for cough and shortness of breath. Negative for sputum production.   Cardiovascular: Positive for orthopnea and leg swelling. Negative for chest pain, palpitations, claudication and syncope.  Gastrointestinal: Negative for abdominal pain and vomiting.  Genitourinary: Negative for dysuria and hematuria.  Musculoskeletal: Negative for arthralgias and back pain.  Skin: Negative for color change and rash.  Neurological: Negative for seizures and syncope.  All other systems reviewed and are negative.    Physical Exam Updated Vital Signs  ED Triage Vitals  Enc Vitals Group     BP 07/28/17 1814 (!) 154/72     Pulse Rate 07/28/17 1814 (!) 116     Resp 07/28/17 1814 18     Temp 07/28/17 1814 98.6 F (37 C)     Temp Source 07/28/17 1814 Oral     SpO2 07/28/17 1814 97 %     Weight --      Height --      Head Circumference --      Peak Flow --      Pain Score 07/28/17 1816 0     Pain Loc --      Pain Edu? --      Excl. in [place]? --     Physical Exam  Constitutional: He appears well-developed and well-nourished.  HENT:  Head: Normocephalic and atraumatic.  Eyes: Pupils are equal, round, and reactive to light. Conjunctivae are normal.  Neck: Normal range of motion. Neck supple.  Cardiovascular: Normal rate and regular rhythm.  No murmur heard. Pulmonary/Chest: No respiratory distress. He has rales.  Increased work of breathing  Abdominal: Soft. There is no tenderness.  Musculoskeletal: Normal range of motion. He exhibits no edema.  Neurological: He is alert.    Skin: Skin is warm and dry. Capillary refill takes less than 2 seconds.  Psychiatric: He has a normal mood and affect.  Nursing note and vitals reviewed.    ED Treatments /  Results  Labs (all labs ordered are listed, but only abnormal results are displayed) Labs Reviewed  BASIC METABOLIC PANEL - Abnormal; Notable for the following components:      Result Value   Chloride 100 (*)    Glucose, Bld 101 (*)    BUN 45 (*)    Creatinine, Ser 10.46 (*)    GFR calc non Af Amer 5 (*)    GFR calc Af Amer 6 (*)    Anion gap 16 (*)    All other components within normal limits  CBC - Abnormal; Notable for the following components:   MCH 25.8 (*)    All other components within normal limits  BRAIN NATRIURETIC PEPTIDE - Abnormal; Notable for the following components:   B Natriuretic Peptide 1,098.0 (*)    All other components within normal limits  CULTURE, BLOOD (SINGLE)  CULTURE, BLOOD (ROUTINE X 2)  CULTURE, BLOOD (ROUTINE X 2)  I-STAT TROPONIN, ED    EKG None  Radiology Dg Chest 2 View  Result Date: 07/28/2017 CLINICAL DATA:  Tachycardia.  Patient is asymptomatic. EXAM: CHEST - 2 VIEW COMPARISON:  Chest x-ray dated July 14, 2017. FINDINGS: The heart size and mediastinal contours are within normal limits. Prominent central pulmonary arteries, similar to prior study. No focal consolidation, pleural effusion, or pneumothorax. No acute osseous abnormality. IMPRESSION: No active cardiopulmonary disease. Electronically Signed   By: Titus Dubin M.D.   On: 07/28/2017 18:46    Procedures Procedures (including critical care time)  Medications Ordered in ED Medications - No data to display   Initial Impression / Assessment and Plan / ED Course  I have reviewed the triage vital signs and the nursing notes.  Pertinent labs & imaging results that were available during my care of the patient were reviewed by me and considered in my medical decision making (see chart for details).      AHMAD VANWEY is a 52 year old male with history of end-stage renal disease on hemodialysis, hypertension who presents to the ED following an abnormal echocardiogram.  Patient has had increasing shortness of breath over the last several weeks to months and had outpatient echocardiogram ordered by cardiologist.  Patient was told to come to the ED after there was an abnormality.  Echocardiogram showed concern for left ventricular outflow obstruction possibly from a calcification or vegetation.  Patient denies any fevers, IV drug use, infectious symptoms.  Patient has been compliant with his dialysis.  He has increased work of breathing on exam but no signs of large volume overload.  He has some rales on exam.  Patient has no signs of respiratory distress and breathing well on room air.  Lab work was collected prior to my evaluation that was significant for elevated BNP but troponin within normal limits.  Creatinine at baseline.  Patient otherwise with no significant electrolyte abnormality or acute leukocytosis.  EKG also reassuring.  No signs of ischemic changes.  Cardiology to be consulted for further recommendations.  Echocardiogram also showed new heart failure with EF of 20-25% and previously mention LVOT obstruction.   Cardiology recommends admission to medicine.  They recommend consulting cardiology team in the morning for further care, likely will need TEE.  Blood cultures were collected x 3 as concern for endocarditis. Abx held at this time as now signs to suggest infection at this time. Patient to be admitted to medicine for further care.  Hemodynamically stable throughout my care.  Final Clinical Impressions(s) / ED Diagnoses   Final  diagnoses:  Acute heart failure, unspecified heart failure type [facility])    ED Discharge Orders    None       Lennice Sites, DO 07/28/17 2306    Lajean Saver, MD 07/29/17 2021

## 2017-07-28 NOTE — ED Notes (Signed)
ED Provider at bedside. 

## 2017-07-28 NOTE — ED Provider Notes (Signed)
Patient placed in Quick Look pathway, seen and evaluated   Chief Complaint: Abnormal Echo   HPI:   52 year old male presents for evaluation after being told to come to the ED because of an abnormal echocardiogram.  Past medical history significant for chronic tachycardia, end-stage renal disease on dialysis Tuesday, Thursday, Saturday, probable sleep apnea.  He's had a cough and PND for approximately 2 months. He denies significant shortness of breath, chest pain, or leg swelling. Initially he went to [facility] and states that it was "a joke".  He then went to [facility] and was told that his x-ray had some vascular congestion and he followed up with cardiology.  He has had extra fluid pulled off at dialysis and has been taking an antihistamine with mild relief of his symptoms. Today he had an echocardiogram which showed his EF was 20-25%.  He was told to come to the emergency department.    ROS: +cough  -chest pain, SOB, leg swelling  Physical Exam:   Gen: No distress  Neuro: Awake and Alert  Skin: Warm    Focused Exam: Heart: Tachycardic with regular rhythm.  No significant JVD    Lungs: Clear to auscultation    Lower extremities: Trace peripheral edema  Initiation of care has begun. The patient has been counseled on the process, plan, and necessity for staying for the completion/evaluation, and the remainder of the medical screening examination    Recardo Evangelist, PA-C 07/28/17 1830    Davonna Belling, MD 07/28/17 2214

## 2017-07-28 NOTE — ED Triage Notes (Signed)
PA-C at bedside 

## 2017-07-28 NOTE — ED Notes (Signed)
Paged thru [facility] @ 2005

## 2017-07-29 DIAGNOSIS — E662 Morbid (severe) obesity with alveolar hypoventilation: Secondary | ICD-10-CM | POA: Diagnosis not present

## 2017-07-29 DIAGNOSIS — I351 Nonrheumatic aortic (valve) insufficiency: Secondary | ICD-10-CM | POA: Diagnosis not present

## 2017-07-29 DIAGNOSIS — D631 Anemia in chronic kidney disease: Secondary | ICD-10-CM | POA: Diagnosis present

## 2017-07-29 DIAGNOSIS — I429 Cardiomyopathy, unspecified: Secondary | ICD-10-CM | POA: Diagnosis not present

## 2017-07-29 DIAGNOSIS — Z6841 Body Mass Index (BMI) 40.0 and over, adult: Secondary | ICD-10-CM | POA: Diagnosis not present

## 2017-07-29 DIAGNOSIS — Z7901 Long term (current) use of anticoagulants: Secondary | ICD-10-CM | POA: Diagnosis not present

## 2017-07-29 DIAGNOSIS — Z79899 Other long term (current) drug therapy: Secondary | ICD-10-CM | POA: Diagnosis not present

## 2017-07-29 DIAGNOSIS — I358 Other nonrheumatic aortic valve disorders: Secondary | ICD-10-CM | POA: Diagnosis not present

## 2017-07-29 DIAGNOSIS — I5020 Unspecified systolic (congestive) heart failure: Secondary | ICD-10-CM | POA: Diagnosis not present

## 2017-07-29 DIAGNOSIS — N186 End stage renal disease: Secondary | ICD-10-CM

## 2017-07-29 DIAGNOSIS — I132 Hypertensive heart and chronic kidney disease with heart failure and with stage 5 chronic kidney disease, or end stage renal disease: Secondary | ICD-10-CM | POA: Diagnosis not present

## 2017-07-29 DIAGNOSIS — I359 Nonrheumatic aortic valve disorder, unspecified: Secondary | ICD-10-CM

## 2017-07-29 DIAGNOSIS — N2581 Secondary hyperparathyroidism of renal origin: Secondary | ICD-10-CM | POA: Diagnosis not present

## 2017-07-29 DIAGNOSIS — R931 Abnormal findings on diagnostic imaging of heart and coronary circulation: Secondary | ICD-10-CM | POA: Diagnosis not present

## 2017-07-29 DIAGNOSIS — T82868D Thrombosis of vascular prosthetic devices, implants and grafts, subsequent encounter: Secondary | ICD-10-CM | POA: Diagnosis not present

## 2017-07-29 DIAGNOSIS — Q248 Other specified congenital malformations of heart: Secondary | ICD-10-CM | POA: Diagnosis not present

## 2017-07-29 DIAGNOSIS — R0602 Shortness of breath: Secondary | ICD-10-CM | POA: Diagnosis not present

## 2017-07-29 DIAGNOSIS — I951 Orthostatic hypotension: Secondary | ICD-10-CM | POA: Diagnosis not present

## 2017-07-29 DIAGNOSIS — I2510 Atherosclerotic heart disease of native coronary artery without angina pectoris: Secondary | ICD-10-CM | POA: Diagnosis not present

## 2017-07-29 DIAGNOSIS — G4733 Obstructive sleep apnea (adult) (pediatric): Secondary | ICD-10-CM | POA: Diagnosis not present

## 2017-07-29 DIAGNOSIS — I5043 Acute on chronic combined systolic (congestive) and diastolic (congestive) heart failure: Secondary | ICD-10-CM | POA: Diagnosis not present

## 2017-07-29 DIAGNOSIS — Z01818 Encounter for other preprocedural examination: Secondary | ICD-10-CM | POA: Diagnosis not present

## 2017-07-29 DIAGNOSIS — I471 Supraventricular tachycardia: Secondary | ICD-10-CM | POA: Diagnosis not present

## 2017-07-29 DIAGNOSIS — Z87891 Personal history of nicotine dependence: Secondary | ICD-10-CM | POA: Diagnosis not present

## 2017-07-29 DIAGNOSIS — I517 Cardiomegaly: Secondary | ICD-10-CM | POA: Diagnosis not present

## 2017-07-29 DIAGNOSIS — I5021 Acute systolic (congestive) heart failure: Secondary | ICD-10-CM

## 2017-07-29 DIAGNOSIS — I061 Rheumatic aortic insufficiency: Secondary | ICD-10-CM | POA: Diagnosis not present

## 2017-07-29 DIAGNOSIS — I729 Aneurysm of unspecified site: Secondary | ICD-10-CM | POA: Diagnosis not present

## 2017-07-29 DIAGNOSIS — K029 Dental caries, unspecified: Secondary | ICD-10-CM | POA: Diagnosis not present

## 2017-07-29 DIAGNOSIS — Z7982 Long term (current) use of aspirin: Secondary | ICD-10-CM | POA: Diagnosis not present

## 2017-07-29 DIAGNOSIS — K089 Disorder of teeth and supporting structures, unspecified: Secondary | ICD-10-CM | POA: Diagnosis not present

## 2017-07-29 DIAGNOSIS — K083 Retained dental root: Secondary | ICD-10-CM | POA: Diagnosis not present

## 2017-07-29 DIAGNOSIS — Z992 Dependence on renal dialysis: Secondary | ICD-10-CM | POA: Diagnosis not present

## 2017-07-29 DIAGNOSIS — I340 Nonrheumatic mitral (valve) insufficiency: Secondary | ICD-10-CM | POA: Diagnosis not present

## 2017-07-29 DIAGNOSIS — I080 Rheumatic disorders of both mitral and aortic valves: Secondary | ICD-10-CM | POA: Diagnosis not present

## 2017-07-29 DIAGNOSIS — K047 Periapical abscess without sinus: Secondary | ICD-10-CM | POA: Diagnosis not present

## 2017-07-29 DIAGNOSIS — I959 Hypotension, unspecified: Secondary | ICD-10-CM | POA: Diagnosis present

## 2017-07-29 DIAGNOSIS — Z8249 Family history of ischemic heart disease and other diseases of the circulatory system: Secondary | ICD-10-CM | POA: Diagnosis not present

## 2017-07-29 DIAGNOSIS — K045 Chronic apical periodontitis: Secondary | ICD-10-CM | POA: Diagnosis present

## 2017-07-29 LAB — MRSA PCR SCREENING: MRSA BY PCR: POSITIVE — AB

## 2017-07-29 LAB — HIV ANTIBODY (ROUTINE TESTING W REFLEX): HIV SCREEN 4TH GENERATION: NONREACTIVE

## 2017-07-29 MED ORDER — ACETAMINOPHEN 325 MG PO TABS: 650.0000 mg | ORAL_TABLET | ORAL | Status: DC | PRN

## 2017-07-29 MED ORDER — IVABRADINE HCL 5 MG PO TABS
5.0000 mg | ORAL_TABLET | Freq: Two times a day (BID) | ORAL | Status: DC
  Administered 2017-07-29 – 2017-08-02 (×7): 5 mg via ORAL
  Filled 2017-07-29 (×12): qty 1

## 2017-07-29 MED ORDER — RENA-VITE PO TABS
1.0000 | ORAL_TABLET | Freq: Every day | ORAL | Status: DC
  Administered 2017-07-29 – 2017-08-02 (×5): 1 via ORAL
  Filled 2017-07-29 (×5): qty 1

## 2017-07-29 MED ORDER — CINACALCET HCL 30 MG PO TABS
90.0000 mg | ORAL_TABLET | Freq: Every day | ORAL | Status: DC
  Administered 2017-07-30 – 2017-08-01 (×3): 90 mg via ORAL
  Filled 2017-07-29 (×4): qty 3

## 2017-07-29 MED ORDER — MIDODRINE HCL 5 MG PO TABS
5.0000 mg | ORAL_TABLET | ORAL | Status: DC
  Administered 2017-07-29 – 2017-08-02 (×4): 5 mg via ORAL
  Filled 2017-07-29 (×3): qty 1

## 2017-07-29 MED ORDER — ONDANSETRON HCL 4 MG/2ML IJ SOLN
4.0000 mg | Freq: Four times a day (QID) | INTRAMUSCULAR | Status: DC | PRN
Start: 2017-07-29 — End: 2017-08-03

## 2017-07-29 MED ORDER — SODIUM CHLORIDE 0.9% FLUSH
3.0000 mL | INTRAVENOUS | Status: DC | PRN
  Administered 2017-08-01: 3 mL via INTRAVENOUS
  Filled 2017-07-29: qty 3

## 2017-07-29 MED ORDER — LANTHANUM CARBONATE 500 MG PO CHEW
2000.0000 mg | CHEWABLE_TABLET | Freq: Three times a day (TID) | ORAL | Status: DC
  Administered 2017-07-29 – 2017-08-02 (×9): 2000 mg via ORAL
  Filled 2017-07-29 (×11): qty 4

## 2017-07-29 MED ORDER — CALCIUM ACETATE (PHOS BINDER) 667 MG PO CAPS
1334.0000 mg | ORAL_CAPSULE | Freq: Three times a day (TID) | ORAL | Status: DC
  Administered 2017-07-29 – 2017-08-02 (×9): 1334 mg via ORAL
  Filled 2017-07-29 (×11): qty 2

## 2017-07-29 MED ORDER — APIXABAN 5 MG PO TABS
5.0000 mg | ORAL_TABLET | Freq: Two times a day (BID) | ORAL | Status: AC
  Administered 2017-07-29 – 2017-07-31 (×6): 5 mg via ORAL
  Filled 2017-07-29 (×6): qty 1

## 2017-07-29 MED ORDER — SODIUM CHLORIDE 0.9 % IV SOLN
INTRAVENOUS | Status: DC
  Administered 2017-07-29: 21:00:00 via INTRAVENOUS

## 2017-07-29 MED ORDER — ASPIRIN EC 81 MG PO TBEC
81.0000 mg | DELAYED_RELEASE_TABLET | Freq: Every day | ORAL | Status: DC
  Administered 2017-07-29 – 2017-07-31 (×3): 81 mg via ORAL
  Filled 2017-07-29 (×3): qty 1

## 2017-07-29 MED ORDER — SODIUM CHLORIDE 0.9% FLUSH
3.0000 mL | Freq: Two times a day (BID) | INTRAVENOUS | Status: DC
  Administered 2017-07-29 – 2017-08-02 (×11): 3 mL via INTRAVENOUS

## 2017-07-29 MED ORDER — METOPROLOL TARTRATE 12.5 MG HALF TABLET
12.5000 mg | ORAL_TABLET | Freq: Two times a day (BID) | ORAL | Status: DC
  Administered 2017-07-29 – 2017-07-31 (×5): 12.5 mg via ORAL
  Filled 2017-07-29 (×6): qty 1

## 2017-07-29 MED ORDER — CALCIUM ACETATE (PHOS BINDER) 667 MG PO CAPS: 667.0000 mg | ORAL_CAPSULE | ORAL | Status: DC | PRN

## 2017-07-29 MED ORDER — ZOLPIDEM TARTRATE 5 MG PO TABS: 5.0000 mg | ORAL_TABLET | Freq: Every evening | ORAL | Status: DC | PRN

## 2017-07-29 MED ORDER — ALPRAZOLAM 0.25 MG PO TABS: 0.2500 mg | ORAL_TABLET | Freq: Two times a day (BID) | ORAL | Status: DC | PRN

## 2017-07-29 MED ORDER — SODIUM CHLORIDE 0.9 % IV SOLN
250.0000 mL | INTRAVENOUS | Status: DC | PRN
  Administered 2017-07-31: 11:00:00 via INTRAVENOUS

## 2017-07-29 MED ORDER — LANTHANUM CARBONATE 500 MG PO CHEW
1000.0000 mg | CHEWABLE_TABLET | Freq: Two times a day (BID) | ORAL | Status: DC | PRN
  Administered 2017-07-29: 1000 mg via ORAL

## 2017-07-29 MED ORDER — BENZONATATE 100 MG PO CAPS: 200.0000 mg | ORAL_CAPSULE | Freq: Three times a day (TID) | ORAL | Status: DC | PRN

## 2017-07-29 NOTE — Progress Notes (Signed)
Patient examined discussed with patient seen and agree  Essentially has been on dialysis for 17 years and has been feeling more dyspneic when laying flat he has lost over the years under the direction of his nephrologist Dr. Lowanda Foster almost 200 pounds with dietary modification--came over from dialysis essentially because he was feeling short of breath despite decreasing his EDW with dialysis He looks well compensated other than his sinus tachycardia at this time  I appreciate cardiology input and thoroughness--Agree that he will probably do better with either a TEE he will need more invasive hemodynamic monitoring given his habitus--- including may be a Swan-Ganz catheter and central pressures and he is an excellent candidate for the heart failure team to manage  Verneita Griffes, MD [facility] ([phone]

## 2017-07-29 NOTE — H&P (Signed)
History and Physical    [name] [name] XFG:[id number] DOB: [birth year] DOA: 07/28/2017  PCP: System, Provider Not In   Patient coming from: Home  Chief Complaint: Cough, tachycardia, paroxysmal nocturnal dyspnea   HPI: ALLAH REASON is a 52 y.o. 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  Patient was evaluated by cardiology for 2 months of persistent cough, sinus tachycardia, and paroxysmal nocturnal dyspnea.  He underwent an outpatient echocardiogram today with EF 20-25% and a large globular and calcified density in the LV outflow tract.  He was directed to the ED for further evaluation of this.  Patient denies any recent fevers, chills, sweats, or weight loss.  He denies any history of IV drug use.  Denies chest pain.  Reports completing his dialysis session on 07/27/2017 without incident.  He has had more fluid removed with dialysis recently in an attempt to ameliorate his symptoms.  ED Course: Upon arrival to the ED, patient is found to be afebrile, saturating well on room air, tachycardic in the 110s, and with low-normal blood pressure.  Chemistry panel features normal electrolytes.  CBC is unremarkable.  Troponin is normal and BNP is elevated to 1098.  EKG features a sinus tachycardia with rate 108 and lateral T wave inversion.  Chest x-ray is negative for acute cardiopulmonary disease.  Cardiology was consulted by the ED physician and recommended blood cultures and medical admission.  Blood cultures were collected in the ED, patient remains hemodynamically stable, and he will be admitted to the telemetry unit for ongoing evaluation and management of new systolic CHF with LV outflow tract obstruction.  Review of Systems:  All other systems reviewed  and apart from HPI, are negative.  Past Medical History:  Diagnosis Date  . Aneurysm ([place])     Right arm fistula 3 aneurysms 2011,   plans to have a new procedure  . Angina    occasional, last 6 mo ago  . Chest discomfort     2008  . CHF (congestive heart failure) ([place])   . Dialysis patient [facility])    M-W-F @ [place]  . Dysrhythmia   . Ejection fraction     65%, echo, 2008   /   EF 55-60%, echo, October 28, 2010  . ESRD (end stage renal disease) ([place])    on hemodialysis T_T_S  . Hypertension   . Leg pain   . Orthostatic hypotension     2008  . Overweight(278.02)   . Sinus tachycardia     2008, TSH normal ... happens frequently with heart rate at 120-130  . Syncope     positional after dialysis... 2008    Past Surgical History:  Procedure Laterality Date  . A/V FISTULAGRAM Left 06/30/2016   Procedure: A/V Fistulagram;  Surgeon: Algernon Huxley, MD;  Location: [place] [facility];  Service: Cardiovascular;  Laterality: Left;  . ARTERIOVENOUS GRAFT PLACEMENT    . AV FISTULA PLACEMENT    . AV FISTULA PLACEMENT Left 11/18/2015   Procedure: ARTERIOVENOUS (AV) FISTULA CREATION ( RADIOCEPHALIC );  Surgeon: Algernon Huxley, MD;  Location: [facility];  Service: Vascular;  Laterality: Left;  . AV FISTULA PLACEMENT Left 03/23/2016   Procedure: ARTERIOVENOUS (AV) FISTULA CREATION ( REVISION );  Surgeon: Algernon Huxley, MD;  Location: [facility];  Service: Vascular;  Laterality: Left;  . [place] REMOVAL Left 03/23/2016   Procedure: REMOVAL OF ARTERIOVENOUS GORETEX GRAFT ([place]);  Surgeon: Algernon Huxley, MD;  Location: [facility];  Service: Vascular;  Laterality: Left;  . DIALYSIS/PERMA CATHETER REMOVAL N/A 08/01/2016   Procedure: Dialysis/Perma Catheter Removal;  Surgeon: Algernon Huxley, MD;  Location: [place] [facility];  Service: Cardiovascular;  Laterality: N/A;  . HERNIA REPAIR    . INSERTION OF DIALYSIS CATHETER  03/01/2011   Procedure: INSERTION OF DIALYSIS CATHETER;  Surgeon: Elam Dutch, MD;  Location: [place];  Service: Vascular;  Laterality: Left;  Exchange of Dialysis Catheter to 27cm 15Fr. Arrow Catheter  . PERIPHERAL VASCULAR CATHETERIZATION N/A 09/01/2014   Procedure: A/V Shuntogram/Fistulagram;  Surgeon: Algernon Huxley, MD;  Location: [place] [facility];  Service: Cardiovascular;  Laterality: N/A;  . PERIPHERAL VASCULAR CATHETERIZATION Right 09/01/2014   Procedure: Thrombectomy;  Surgeon: Algernon Huxley, MD;  Location: [place] [facility];  Service: Cardiovascular;  Laterality: Right;  . PERIPHERAL VASCULAR CATHETERIZATION Right 09/01/2014   Procedure: A/V Shunt Intervention;  Surgeon: Algernon Huxley, MD;  Location: [place] [facility];  Service: Cardiovascular;  Laterality: Right;  . PERIPHERAL VASCULAR CATHETERIZATION N/A 09/17/2014   Procedure: A/V Shuntogram/Fistulagram;  Surgeon: Algernon Huxley, MD;  Location: [place] [facility];  Service: Cardiovascular;  Laterality: N/A;  . PERIPHERAL VASCULAR CATHETERIZATION Right 10/17/2014   Procedure: Thrombectomy;  Surgeon: Katha Cabal, MD;  Location: [place] [facility];  Service: Cardiovascular;  Laterality: Right;  . PERIPHERAL VASCULAR CATHETERIZATION N/A 10/17/2014   Procedure: A/V Shuntogram/Fistulagram;  Surgeon: Katha Cabal, MD;  Location: [place] [facility];  Service: Cardiovascular;  Laterality: N/A;  . PERIPHERAL VASCULAR CATHETERIZATION N/A 10/17/2014   Procedure: A/V Shunt Intervention;  Surgeon: Katha Cabal, MD;  Location: [place] [facility];  Service: Cardiovascular;  Laterality: N/A;  . PERIPHERAL VASCULAR CATHETERIZATION Right 11/04/2014   Procedure: Thrombectomy;  Surgeon: Katha Cabal, MD;  Location: [place] [facility];  Service: Cardiovascular;  Laterality: Right;  . PERIPHERAL VASCULAR CATHETERIZATION Left 11/04/2014   Procedure: Visceral Venography;  Surgeon: Katha Cabal, MD;  Location: [place] [facility];  Service: Cardiovascular;  Laterality: Left;  . PERIPHERAL VASCULAR CATHETERIZATION Right 11/27/2014    Procedure: A/V Shuntogram/Fistulagram;  Surgeon: Algernon Huxley, MD;  Location: [place] [facility];  Service: Cardiovascular;  Laterality: Right;  . PERIPHERAL VASCULAR CATHETERIZATION Right 11/27/2014   Procedure: A/V Shunt Intervention;  Surgeon: Algernon Huxley, MD;  Location: [place] [facility];  Service: Cardiovascular;  Laterality: Right;  . PERIPHERAL VASCULAR CATHETERIZATION Right 12/31/2014   Procedure: Thrombectomy;  Surgeon: Algernon Huxley, MD;  Location: [place] [facility];  Service: Cardiovascular;  Laterality: Right;  . PERIPHERAL VASCULAR CATHETERIZATION Right 01/12/2015   Procedure: A/V Shuntogram/Fistulagram;  Surgeon: Algernon Huxley, MD;  Location: [place] [facility];  Service: Cardiovascular;  Laterality: Right;  . PERIPHERAL VASCULAR CATHETERIZATION N/A 01/12/2015   Procedure: A/V Shunt Intervention;  Surgeon: Algernon Huxley, MD;  Location: [place] [facility];  Service: Cardiovascular;  Laterality: N/A;  . PERIPHERAL VASCULAR CATHETERIZATION Right 01/28/2015   Procedure: Thrombectomy;  Surgeon: Algernon Huxley, MD;  Location: [place] [facility];  Service: Cardiovascular;  Laterality: Right;  . PERIPHERAL VASCULAR CATHETERIZATION N/A 06/08/2015   Procedure: A/V Shuntogram/Fistulagram;  Surgeon: Algernon Huxley, MD;  Location: [place] [facility];  Service: Cardiovascular;  Laterality: N/A;  . PERIPHERAL VASCULAR CATHETERIZATION  N/A 06/08/2015   Procedure: A/V Shunt Intervention;  Surgeon: Algernon Huxley, MD;  Location: [place] [facility];  Service: Cardiovascular;  Laterality: N/A;  . PERIPHERAL VASCULAR CATHETERIZATION Right 07/06/2015   Procedure: A/V Shuntogram/Fistulagram;  Surgeon: Algernon Huxley, MD;  Location: [place] [facility];  Service: Cardiovascular;  Laterality: Right;  . PERIPHERAL VASCULAR CATHETERIZATION N/A 07/06/2015   Procedure: A/V Shunt Intervention;  Surgeon: Algernon Huxley, MD;  Location: [place] [facility];  Service: Cardiovascular;  Laterality: N/A;  . PERIPHERAL VASCULAR  CATHETERIZATION N/A 08/04/2015   Procedure: graft declot;  Surgeon: Katha Cabal, MD;  Location: [place] [facility];  Service: Cardiovascular;  Laterality: N/A;  . PERIPHERAL VASCULAR CATHETERIZATION Right 08/25/2015   Procedure: A/V Shuntogram/Fistulagram;  Surgeon: Katha Cabal, MD;  Location: [place] [facility];  Service: Cardiovascular;  Laterality: Right;  . PERIPHERAL VASCULAR CATHETERIZATION N/A 11/04/2015   Procedure: Dialysis/Perma Catheter Insertion;  Surgeon: Algernon Huxley, MD;  Location: [place] [facility];  Service: Cardiovascular;  Laterality: N/A;  . PERIPHERAL VASCULAR CATHETERIZATION Left 12/14/2015   Procedure: A/V Shuntogram/Fistulagram;  Surgeon: Algernon Huxley, MD;  Location: [place] [facility];  Service: Cardiovascular;  Laterality: Left;  . PERIPHERAL VASCULAR CATHETERIZATION N/A 12/14/2015   Procedure: A/V Shunt Intervention;  Surgeon: Algernon Huxley, MD;  Location: [place] [facility];  Service: Cardiovascular;  Laterality: N/A;  . PERIPHERAL VASCULAR CATHETERIZATION N/A 12/17/2015   Procedure: Dialysis/Perma Catheter Insertion;  Surgeon: Algernon Huxley, MD;  Location: [place] [facility];  Service: Cardiovascular;  Laterality: N/A;  . PERIPHERAL VASCULAR CATHETERIZATION Left 12/22/2015   Procedure: Dialysis/Perma Catheter Insertion;  Surgeon: Katha Cabal, MD;  Location: [place] [facility];  Service: Cardiovascular;  Laterality: Left;  . PERIPHERAL VASCULAR CATHETERIZATION Left 02/29/2016   Procedure: A/V Shuntogram/Fistulagram;  Surgeon: Algernon Huxley, MD;  Location: [place] [facility];  Service: Cardiovascular;  Laterality: Left;  . PERIPHERAL VASCULAR CATHETERIZATION N/A 02/29/2016   Procedure: A/V Shunt Intervention;  Surgeon: Algernon Huxley, MD;  Location: [place] [facility];  Service: Cardiovascular;  Laterality: N/A;  . right arm graft     for dyalisis  . THROMBECTOMY    . THROMBECTOMY W/ EMBOLECTOMY  03/01/2011   Procedure: THROMBECTOMY ARTERIOVENOUS  GORE-TEX GRAFT;  Surgeon: Elam Dutch, MD;  Location: [place];  Service: Vascular;  Laterality: Right;  Attempted Thrombectomy of Old  Right Upper Arm Arteriovenous gortex Graft. Insertion of new Arteriovenous Graft using 53mm x 50cm Gortex Stretch graft.   . THROMBECTOMY W/ EMBOLECTOMY  07/11/2011   Procedure: THROMBECTOMY ARTERIOVENOUS GORE-TEX GRAFT;  Surgeon: Rosetta Posner, MD;  Location: [place];  Service: Vascular;  Laterality: Right;  . UMBILICAL HERNIA REPAIR    . VENOGRAM N/A 08/23/2011   Procedure: VENOGRAM;  Surgeon: [name], MD;  Location[facility];  Service: Cardiovascular;  Laterality: N/A;     reports that he quit smoking about 23 years ago. His smoking use included cigarettes. His smokeless tobacco use includes snuff. He reports that he does not drink alcohol or use drugs.  No Known Allergies  Family History  Problem Relation Age of Onset  . Hypertension Mother   . Heart disease Mother   . Hypertension Father   . Heart disease Father      Prior to Admission medications   Medication Sig Start Date End Date Taking? Authorizing Provider  apixaban (ELIQUIS) 5 MG TABS tablet Take 1 tablet (5 mg total)  2 (two) times daily by mouth. 02/23/17  Yes Algernon Huxley, MD  aspirin EC 81 MG tablet Take 81 mg by mouth every morning.   Yes [provider]  B Complex-C-Folic Acid (NEPHRO-VITE PO) Take 1 tablet by mouth at bedtime.    Yes [provider]  benzonatate (TESSALON) 100 MG capsule 2 po tid for cough Patient taking differently: Take 200 mg by mouth 2 (two) times daily as needed. 2 po tid for cough 07/14/17  Yes Lily Kocher, PA-C  calcium acetate (PHOSLO) 667 MG capsule Take 2,001 mg by mouth See admin instructions. 1334 mg 3 times a day with meals and 667 mg with snacks   Yes [provider]  cinacalcet (SENSIPAR) 90 MG tablet Take 90 mg by mouth daily.   Yes [provider]  FOSRENOL 500 MG chewable tablet Chew 2,000 mg by mouth See  admin instructions. Take 2000 mg with meals and 1000 mg with snacks 05/28/13  Yes [provider]  midodrine (PROAMATINE) 5 MG tablet Take 1 tablet by mouth See admin instructions. Pt may take before dialysis treatment, Tues, Thurs and Sat 02/09/16  Yes [provider]  nitroGLYCERIN (NITROSTAT) 0.4 MG SL tablet Place 1 tablet (0.4 mg total) under the tongue every 5 (five) minutes as needed. For chest 05/30/13  Yes Liliane Shi, [place]    Physical Exam: Vitals:   07/28/17 2215 07/28/17 2300 07/28/17 2315 07/28/17 2330  BP: 104/68 102/65 113/64 107/64  Pulse: (!) 104 (!) 108 (!) 107 (!) 106  Resp: 16 (!) 34 (!) 27 20  Temp:      TempSrc:      SpO2: 95% 91% 91% 96%      Constitutional: NAD, calm  Eyes: PERTLA, lids and conjunctivae normal ENMT: Mucous membranes are moist. Posterior pharynx clear of any exudate or lesions.   Neck: normal, supple, no masses, no thyromegaly Respiratory: clear to auscultation bilaterally, no wheezing, no crackles. Normal respiratory effort.    Cardiovascular: S1 & S2 heard, regular rate and rhythm. No significant JVD. Abdomen: No distension, no tenderness, soft. Bowel sounds normal.  Musculoskeletal: no clubbing / cyanosis. No joint deformity upper and lower extremities.   Skin: no significant rashes, lesions, ulcers. Warm, dry, well-perfused. Neurologic: CN 2-12 grossly intact. Sensation intact. Strength 5/5 in all 4 limbs.  Psychiatric: Alert and oriented x 3. Pleasant, cooperative.     Labs on Admission: I have personally reviewed following labs and imaging studies  CBC: Recent Labs  Lab 07/28/17 1830  WBC 9.7  HGB 13.2  HCT 41.7  MCV 81.6  PLT 734   Basic Metabolic Panel: Recent Labs  Lab 07/28/17 1830  NA 141  K 4.2  CL 100*  CO2 25  GLUCOSE 101*  BUN 45*  CREATININE 10.46*  CALCIUM 9.2   GFR: Estimated Creatinine Clearance: 12.4 mL/min (A) (by C-G formula based on SCr of 10.46 mg/dL (H)). Liver Function  Tests: No results for input(s): AST, ALT, ALKPHOS, BILITOT, PROT, ALBUMIN in the last 168 hours. No results for input(s): LIPASE, AMYLASE in the last 168 hours. No results for input(s): AMMONIA in the last 168 hours. Coagulation Profile: No results for input(s): INR, PROTIME in the last 168 hours. Cardiac Enzymes: No results for input(s): CKTOTAL, CKMB, CKMBINDEX, TROPONINI in the last 168 hours. BNP (last 3 results) No results for input(s): PROBNP in the last 8760 hours. HbA1C: No results for input(s): HGBA1C in the last 72 hours. CBG: No results for input(s):  GLUCAP in the last 168 hours. Lipid Profile: No results for input(s): CHOL, HDL, LDLCALC, TRIG, CHOLHDL, LDLDIRECT in the last 72 hours. Thyroid Function Tests: No results for input(s): TSH, T4TOTAL, FREET4, T3FREE, THYROIDAB in the last 72 hours. Anemia Panel: No results for input(s): VITAMINB12, FOLATE, FERRITIN, TIBC, IRON, RETICCTPCT in the last 72 hours. Urine analysis: No results found for: COLORURINE, APPEARANCEUR, LABSPEC, PHURINE, GLUCOSEU, HGBUR, BILIRUBINUR, KETONESUR, PROTEINUR, UROBILINOGEN, NITRITE, LEUKOCYTESUR Sepsis Labs: @LABRCNTIP (procalcitonin:4,lacticidven:4) )No results found for this or any previous visit (from the past 240 hour(s)).   Radiological Exams on Admission: Dg Chest 2 View  Result Date: 07/28/2017 CLINICAL DATA:  Tachycardia.  Patient is asymptomatic. EXAM: CHEST - 2 VIEW COMPARISON:  Chest x-ray dated July 14, 2017. FINDINGS: The heart size and mediastinal contours are within normal limits. Prominent central pulmonary arteries, similar to prior study. No focal consolidation, pleural effusion, or pneumothorax. No acute osseous abnormality. IMPRESSION: No active cardiopulmonary disease. Electronically Signed   By: Titus Dubin M.D.   On: 07/28/2017 18:46    EKG: Independently reviewed. Sinus tachycardia (rate 108), lateral T-wave abnormality.   Assessment/Plan   1. LV outflow obstruction    - Evaluated by cardiology for persistent cough, paroxysmal nocturnal dyspnea, and tachycardia; underwent transthoracic echo on 0/03 with new systolic CHF (EF 70-48%) and LVOT with large globular calcified density in outflow tract, possibly associated with valve - Denies any infectious sxs, afebrile, no wt loss or sweats   - He was advised by cardiology to present to ED and [facility]  - Cardiology consulted by ED physician and recommended blood cultures and medical admission   - Blood cultures collected in ED   2. ESRD  - Dialyzes TTS, completed session on 4/11  - No indication for urgent HD on admission  - SLIV, fluid-restrict, repeat chem panel in am   3. Recurrent AV graft thrombosis  - No evidence for acute thrombosis  - Continue Eliquis    DVT prophylaxis: Eliquis  Code Status: Full  Family Communication: Significant other updated at bedside Consults called: Cardiology Admission status: Inpatient    Vianne Bulls, MD [facility] Pager [phone]  If 7PM-7AM, please contact night-coverage [email or web address] Password [facility]  07/29/2017, 12:05 AM

## 2017-07-29 NOTE — Progress Notes (Addendum)
Cardiology Consultation:   Patient ID: [name] [name]; [id number]; 1966-01-16   Admit date: 07/28/2017 Date of Consult: 07/29/2017  Primary Care Provider: System, Provider Not In Primary Cardiologist: Kate Sable, MD  Primary Electrophysiologist:  n/a   Patient Profile:   [name] [name] is a 52 y.o. male with a hx of ESRD who is being seen today for the evaluation of  CHF and aortic valve mass at the request of Dr. Verlon Au.  History of Present Illness:   [name] [name] has been on HD for 17 years (not referred to transplantation due to severe obesity, has lost from 600 to 325 lb). 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 EF was 55-60% by echo in 2012, although it did show LVH, LA enlargement and grade 1 diastolic dysfunction.  He has only mild problems with pedal edema. He denies angina pectoris. He has NYHA class IIIb-4 dyspnea. He is almost anuric. HD is TTS. 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 He takes proamatine for orthostatic hypotension, which has caused syncope after HD in the past. Tachycardia at rest has been documented for at least the last 11 years.  Other than obesity and ESRD, he does not have any other conventional coronary risk factors. He quit smoking > 20 years ago. He has not had any recent dental or endoscopic or surgical procedures, but of course has risk of bacteremia with HD. 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  Reportedly,his father died of CHF at age 16, mechanism unknown.  Past Medical History:  Diagnosis Date  . Aneurysm ([place])      Right arm fistula 3 aneurysms 2011,   plans to have a new procedure  . Angina    occasional, last 6 mo ago  . Chest discomfort     2008  . CHF (congestive heart failure) ([place])   . Dialysis patient [facility])    M-W-F @ [place]  . Dysrhythmia   . Ejection fraction     65%, echo, 2008   /   EF 55-60%, echo, October 28, 2010  . ESRD (end stage renal disease) ([place])    on hemodialysis T_T_S  . Hypertension   . Leg pain   . Orthostatic hypotension     2008  . Overweight(278.02)   . Sinus tachycardia     2008, TSH normal ... happens frequently with heart rate at 120-130  . Syncope     positional after dialysis... 2008    Past Surgical History:  Procedure Laterality Date  . A/V FISTULAGRAM Left 06/30/2016   Procedure: A/V Fistulagram;  Surgeon: Algernon Huxley, MD;  Location: [place] [facility];  Service: Cardiovascular;  Laterality: Left;  . ARTERIOVENOUS GRAFT PLACEMENT    . AV FISTULA PLACEMENT    . AV FISTULA PLACEMENT Left 11/18/2015   Procedure: ARTERIOVENOUS (AV) FISTULA CREATION ( RADIOCEPHALIC );  Surgeon: Algernon Huxley, MD;  Location: [facility] ORS;  Service: Vascular;  Laterality: Left;  . AV FISTULA PLACEMENT Left 03/23/2016   Procedure: ARTERIOVENOUS (AV) FISTULA CREATION ( REVISION );  Surgeon: Algernon Huxley, MD;  Location: [facility] OR[facility];  Service: Vascular;  Laterality: Left;  . [place] REMOVAL Left 03/23/2016   Procedure: REMOVAL OF ARTERIOVENOUS GORETEX GRAFT ([place]);  Surgeon: Algernon Huxley, MD;  Location: [facility] OR[facility];  Service: Vascular;  Laterality: Left;  . DIALYSIS/PERMA CATHETER REMOVAL N/A 08/01/2016   Procedure: Dialysis/Perma Catheter Removal;  Surgeon: Algernon Huxley, MD;  Location: [place] [facility];  Service: Cardiovascular;  Laterality: N/A;  . HERNIA REPAIR    . INSERTION OF DIALYSIS CATHETER  03/01/2011   Procedure: INSERTION OF DIALYSIS CATHETER;  Surgeon: Elam Dutch, MD;  Location: [place];  Service: Vascular;  Laterality: Left;  Exchange of Dialysis Catheter to 27cm 15Fr. Arrow  Catheter  . PERIPHERAL VASCULAR CATHETERIZATION N/A 09/01/2014   Procedure: A/V Shuntogram/Fistulagram;  Surgeon: Algernon Huxley, MD;  Location: [place] [facility];  Service: Cardiovascular;  Laterality: N/A;  . PERIPHERAL VASCULAR CATHETERIZATION Right 09/01/2014   Procedure: Thrombectomy;  Surgeon: Algernon Huxley, MD;  Location: [place] [facility];  Service: Cardiovascular;  Laterality: Right;  . PERIPHERAL VASCULAR CATHETERIZATION Right 09/01/2014   Procedure: A/V Shunt Intervention;  Surgeon: Algernon Huxley, MD;  Location: [place] [facility];  Service: Cardiovascular;  Laterality: Right;  . PERIPHERAL VASCULAR CATHETERIZATION N/A 09/17/2014   Procedure: A/V Shuntogram/Fistulagram;  Surgeon: Algernon Huxley, MD;  Location: [place] [facility];  Service: Cardiovascular;  Laterality: N/A;  . PERIPHERAL VASCULAR CATHETERIZATION Right 10/17/2014   Procedure: Thrombectomy;  Surgeon: Katha Cabal, MD;  Location: [place] [facility];  Service: Cardiovascular;  Laterality: Right;  . PERIPHERAL VASCULAR CATHETERIZATION N/A 10/17/2014   Procedure: A/V Shuntogram/Fistulagram;  Surgeon: Katha Cabal, MD;  Location: [place] [facility];  Service: Cardiovascular;  Laterality: N/A;  . PERIPHERAL VASCULAR CATHETERIZATION N/A 10/17/2014   Procedure: A/V Shunt Intervention;  Surgeon: Katha Cabal, MD;  Location: [place] [facility];  Service: Cardiovascular;  Laterality: N/A;  . PERIPHERAL VASCULAR CATHETERIZATION Right 11/04/2014   Procedure: Thrombectomy;  Surgeon: Katha Cabal, MD;  Location: [place] [facility];  Service: Cardiovascular;  Laterality: Right;  . PERIPHERAL VASCULAR CATHETERIZATION Left 11/04/2014   Procedure: Visceral Venography;  Surgeon: Katha Cabal, MD;  Location: [place] [facility];  Service: Cardiovascular;  Laterality: Left;  . PERIPHERAL VASCULAR CATHETERIZATION Right 11/27/2014   Procedure: A/V Shuntogram/Fistulagram;  Surgeon: Algernon Huxley, MD;  Location: [place] [facility]  [facility];  Service: Cardiovascular;  Laterality: Right;  . PERIPHERAL VASCULAR CATHETERIZATION Right 11/27/2014   Procedure: A/V Shunt Intervention;  Surgeon: Algernon Huxley, MD;  Location: [place] [facility];  Service: Cardiovascular;  Laterality: Right;  . PERIPHERAL VASCULAR CATHETERIZATION Right 12/31/2014   Procedure: Thrombectomy;  Surgeon: Algernon Huxley, MD;  Location: [place] [facility];  Service: Cardiovascular;  Laterality: Right;  . PERIPHERAL VASCULAR CATHETERIZATION Right 01/12/2015   Procedure: A/V Shuntogram/Fistulagram;  Surgeon: Algernon Huxley, MD;  Location: [place] [facility];  Service: Cardiovascular;  Laterality: Right;  . PERIPHERAL VASCULAR CATHETERIZATION N/A 01/12/2015   Procedure: A/V Shunt Intervention;  Surgeon: Algernon Huxley, MD;  Location: [place] [facility];  Service: Cardiovascular;  Laterality: N/A;  . PERIPHERAL VASCULAR CATHETERIZATION Right 01/28/2015   Procedure: Thrombectomy;  Surgeon: Algernon Huxley, MD;  Location: [place] [facility];  Service: Cardiovascular;  Laterality: Right;  . PERIPHERAL VASCULAR CATHETERIZATION N/A 06/08/2015   Procedure: A/V Shuntogram/Fistulagram;  Surgeon: Algernon Huxley, MD;  Location: [place] [facility];  Service: Cardiovascular;  Laterality: N/A;  . PERIPHERAL VASCULAR CATHETERIZATION  N/A 06/08/2015   Procedure: A/V Shunt Intervention;  Surgeon: Algernon Huxley, MD;  Location: [place] [facility];  Service: Cardiovascular;  Laterality: N/A;  . PERIPHERAL VASCULAR CATHETERIZATION Right 07/06/2015   Procedure: A/V Shuntogram/Fistulagram;  Surgeon: Algernon Huxley, MD;  Location: [place] [facility];  Service: Cardiovascular;  Laterality: Right;  . PERIPHERAL VASCULAR CATHETERIZATION N/A 07/06/2015   Procedure: A/V Shunt Intervention;  Surgeon: Algernon Huxley, MD;  Location: [place] [facility];  Service: Cardiovascular;  Laterality: N/A;  . PERIPHERAL VASCULAR CATHETERIZATION N/A 08/04/2015   Procedure: graft declot;  Surgeon: Katha Cabal, MD;   Location: [place] [facility];  Service: Cardiovascular;  Laterality: N/A;  . PERIPHERAL VASCULAR CATHETERIZATION Right 08/25/2015   Procedure: A/V Shuntogram/Fistulagram;  Surgeon: Katha Cabal, MD;  Location: [place] [facility];  Service: Cardiovascular;  Laterality: Right;  . PERIPHERAL VASCULAR CATHETERIZATION N/A 11/04/2015   Procedure: Dialysis/Perma Catheter Insertion;  Surgeon: Algernon Huxley, MD;  Location: [place] [facility];  Service: Cardiovascular;  Laterality: N/A;  . PERIPHERAL VASCULAR CATHETERIZATION Left 12/14/2015   Procedure: A/V Shuntogram/Fistulagram;  Surgeon: Algernon Huxley, MD;  Location: [place] [facility];  Service: Cardiovascular;  Laterality: Left;  . PERIPHERAL VASCULAR CATHETERIZATION N/A 12/14/2015   Procedure: A/V Shunt Intervention;  Surgeon: Algernon Huxley, MD;  Location: [place] [facility];  Service: Cardiovascular;  Laterality: N/A;  . PERIPHERAL VASCULAR CATHETERIZATION N/A 12/17/2015   Procedure: Dialysis/Perma Catheter Insertion;  Surgeon: Algernon Huxley, MD;  Location: [place] [facility];  Service: Cardiovascular;  Laterality: N/A;  . PERIPHERAL VASCULAR CATHETERIZATION Left 12/22/2015   Procedure: Dialysis/Perma Catheter Insertion;  Surgeon: Katha Cabal, MD;  Location: [place] [facility];  Service: Cardiovascular;  Laterality: Left;  . PERIPHERAL VASCULAR CATHETERIZATION Left 02/29/2016   Procedure: A/V Shuntogram/Fistulagram;  Surgeon: Algernon Huxley, MD;  Location: [place] [facility];  Service: Cardiovascular;  Laterality: Left;  . PERIPHERAL VASCULAR CATHETERIZATION N/A 02/29/2016   Procedure: A/V Shunt Intervention;  Surgeon: Algernon Huxley, MD;  Location: [place] [facility];  Service: Cardiovascular;  Laterality: N/A;  . right arm graft     for dyalisis  . THROMBECTOMY    . THROMBECTOMY W/ EMBOLECTOMY  03/01/2011   Procedure: THROMBECTOMY ARTERIOVENOUS GORE-TEX GRAFT;  Surgeon: Elam Dutch, MD;  Location: [place];  Service: Vascular;   Laterality: Right;  Attempted Thrombectomy of Old  Right Upper Arm Arteriovenous gortex Graft. Insertion of new Arteriovenous Graft using 23mm x 50cm Gortex Stretch graft.   . THROMBECTOMY W/ EMBOLECTOMY  07/11/2011   Procedure: THROMBECTOMY ARTERIOVENOUS GORE-TEX GRAFT;  Surgeon: Rosetta Posner, MD;  Location: [place];  Service: Vascular;  Laterality: Right;  . UMBILICAL HERNIA REPAIR    . VENOGRAM N/A 08/23/2011   Procedure: VENOGRAM;  Surgeon: [name], MD;  Location: [facility] C[facility] LAB;  Service: Cardiovascular;  Laterality: N/A;     Home Medications:  Prior to Admission medications   Medication Sig Start Date End Date Taking? Authorizing Provider  apixaban (ELIQUIS) 5 MG TABS tablet Take 1 tablet (5 mg total) 2 (two) times daily by mouth. 02/23/17  Yes Algernon Huxley, MD  aspirin EC 81 MG tablet Take 81 mg by mouth every morning.   Yes [provider]  B Complex-C-Folic Acid (NEPHRO-VITE PO) Take 1 tablet by mouth at bedtime.    Yes [provider]  benzonatate (TESSALON) 100 MG capsule 2 po tid for cough Patient taking differently: Take  200 mg by mouth 2 (two) times daily as needed. 2 po tid for cough 07/14/17  Yes Lily Kocher, PA-C  calcium acetate (PHOSLO) 667 MG capsule Take 2,001 mg by mouth See admin instructions. 1334 mg 3 times a day with meals and 667 mg with snacks   Yes [provider]  cinacalcet (SENSIPAR) 90 MG tablet Take 90 mg by mouth daily.   Yes [provider]  FOSRENOL 500 MG chewable tablet Chew 2,000 mg by mouth See admin instructions. Take 2000 mg with meals and 1000 mg with snacks 05/28/13  Yes [provider]  midodrine (PROAMATINE) 5 MG tablet Take 1 tablet by mouth See admin instructions. Pt may take before dialysis treatment, Tues, Thurs and Sat 02/09/16  Yes [provider]  nitroGLYCERIN (NITROSTAT) 0.4 MG SL tablet Place 1 tablet (0.4 mg total) under the tongue every 5 (five) minutes as needed. For chest 05/30/13  Yes  Richardson Dopp T, PA-C    Inpatient Medications: Scheduled Meds: . apixaban  5 mg Oral BID  . aspirin EC  81 mg Oral Daily  . calcium acetate  1,334 mg Oral TID WC  . cinacalcet  90 mg Oral Q breakfast  . lanthanum  2,000 mg Oral TID WC  . midodrine  5 mg Oral Q T,Th,Sa-HD  . multivitamin  1 tablet Oral QHS  . sodium chloride flush  3 mL Intravenous Q12H   Continuous Infusions: . sodium chloride     PRN Meds: sodium chloride, acetaminophen, ALPRAZolam, benzonatate, calcium acetate, lanthanum, ondansetron (ZOFRAN) IV, sodium chloride flush, zolpidem  Allergies:   No Known Allergies  Social History:   Social History   Socioeconomic History  . Marital status: Married    Spouse name: Not on file  . Number of children: Not on file  . Years of education: Not on file  . Highest education level: Not on file  Occupational History  . Not on file  Social Needs  . Financial resource strain: Not on file  . Food insecurity:    Worry: Not on file    Inability: Not on file  . Transportation needs:    Medical: Not on file    Non-medical: Not on file  Tobacco Use  . Smoking status: Former Smoker    Types: Cigarettes    Last attempt to quit: 04/18/1994    Years since quitting: 23.2  . Smokeless tobacco: Current User    Types: Snuff  . Tobacco comment: dips 1/2 snuff per day times 25 years  Substance and Sexual Activity  . Alcohol use: No  . Drug use: No  . Sexual activity: Not on file  Lifestyle  . Physical activity:    Days per week: Not on file    Minutes per session: Not on file  . Stress: Not on file  Relationships  . Social connections:    Talks on phone: Not on file    Gets together: Not on file    Attends religious service: Not on file    Active member of club or organization: Not on file    Attends meetings of clubs or organizations: Not on file    Relationship status: Not on file  . Intimate partner violence:    Fear of current or ex partner: Not on file     Emotionally abused: Not on file    Physically abused: Not on file    Forced sexual activity: Not on file  Other Topics Concern  . Not on  file  Social History Narrative   Single    Family History:    Family History  Problem Relation Age of Onset  . Hypertension Mother   . Heart disease Mother   . Hypertension Father   . Heart disease Father      ROS:  Please see the history of present illness.   All other ROS reviewed and negative.     Physical Exam/Data:   Vitals:   07/29/17 0130 07/29/17 0214 07/29/17 0455 07/29/17 0800  BP: 96/60 120/78 105/66   Pulse: (!) 108 (!) 109 (!) 110 (!) 109  Resp: (!) 28 (!) 28 (!) 26 (!) 25  Temp:  97.6 F (36.4 C) 98.4 F (36.9 C)   TempSrc:  Oral Oral   SpO2: 92% 93% 90% 94%  Weight:  (!) 325 lb 14.4 oz (147.8 kg)    Height:  6' (1.829 m)      Intake/Output Summary (Last 24 hours) at 07/29/2017 1241 Last data filed at 07/29/2017 0945 Gross per 24 hour  Intake 0 ml  Output -  Net 0 ml   Filed Weights   07/29/17 0214  Weight: (!) 325 lb 14.4 oz (147.8 kg)   Body mass index is 44.2 kg/m.  General:  Well nourished, well developed, in no acute distress, obese HEENT: normal Lymph: no adenopathy Neck: no obvious JVD, difficult to evaluate Endocrine:  No thryomegaly Vascular: No carotid bruits; FA pulses 2+ bilaterally without bruits  Cardiac:  normal S1, S2; RRR; no murmur  Lungs:  clear to auscultation bilaterally, no wheezing, rhonchi or rales  Abd: soft, nontender, no hepatomegaly  Ext: no edema Musculoskeletal:  No deformities, BUE and BLE strength normal and equal Skin: warm and dry  Neuro:  CNs 2-12 intact, no focal abnormalities noted Psych:  Normal affect   EKG:  The EKG was personally reviewed and demonstrates:  Sinus tachycardia, QS pattern V1-V2, mild T wave inversion I and aVL Telemetry:  Telemetry was personally reviewed and demonstrates:  Sinus tachycardia 100-130s, rare PVCs  Relevant CV Studies: Echo  07/28/2017 - Procedure narrative: Transthoracic echocardiography. Image   quality was adequate. The study was technically difficult, as a   result of poor acoustic windows, poor sound wave transmission,   and body habitus. - Left ventricle: Wall thickness was increased in a pattern of mild   LVH. Systolic function was severely reduced. The estimated   ejection fraction was in the range of 20% to 25%. The study is   not technically sufficient to allow evaluation of LV diastolic   function. - Aortic valve: Moderately calcified annulus. Trileaflet; mildly   calcified leaflets. There was mild to moderate regurgitation -   not well defined. Large, globular and calcified density noted in   LVOT just proximal to the aortic valve. Parasternal views suggest   potential attachment to the LVOT/annulus, however short axis   views are more consistent with attachment to the left coronary   cusp. Due to large size this appears to obstruct flow through the   LVOT which could be impacting cardiac output and therefore   resulting tachycardia. High suspicion for valvular vegetation,   although could be more chronic finding due to calcification. TEE   is indicated to evaluate further. - Mitral valve: Moderately to severely calcified annulus. There was   mild regurgitation. Valve area by continuity equation (using LVOT   flow): 2.45 cm^2. - Left atrium: The atrium was moderately dilated. - Right ventricle: Poorly visualized. Systolic function  was   reduced. - Tricuspid valve: Mildly thickened leaflets. There was mild   regurgitation. Peak RV-RA gradient (S): 23 mm Hg. - Pericardium, extracardiac: There was no pericardial effusion.  Impressions:  - Large, globular and calcified density noted in LVOT just proximal   to the aortic valve. Parasternal views suggest potential   attachment to the LVOT/annulus, however short axis views are more   consistent with attachment to the left coronary cusp. Due  to   large size this appears to obstruct flow through the LVOT which   could be impacting cardiac output and therefore resulting   tachycardia. High suspicion for valvular vegetation, although   could be more chronic finding due to calcification. TEE is   indicated to evaluate further. Ordering provider out of the   office. Patient being contacted by nursing and directed to ER at   [facility] in anticipation of admission for further evaluation of   both cardiomyopathy and potential endocarditis.  Laboratory Data:  Chemistry Recent Labs  Lab 07/28/17 1830  NA 141  K 4.2  CL 100*  CO2 25  GLUCOSE 101*  BUN 45*  CREATININE 10.46*  CALCIUM 9.2  GFRNONAA 5*  GFRAA 6*  ANIONGAP 16*    No results for input(s): PROT, ALBUMIN, AST, ALT, ALKPHOS, BILITOT in the last 168 hours. Hematology Recent Labs  Lab 07/28/17 1830  WBC 9.7  RBC 5.11  HGB 13.2  HCT 41.7  MCV 81.6  MCH 25.8*  MCHC 31.7  RDW 15.4  PLT 283   Cardiac EnzymesNo results for input(s): TROPONINI in the last 168 hours.  Recent Labs  Lab 07/28/17 1857  TROPIPOC 0.01    BNP Recent Labs  Lab 07/28/17 1831  BNP 1,098.0*    DDimer No results for input(s): DDIMER in the last 168 hours.  Radiology/Studies:  Dg Chest 2 View  Result Date: 07/28/2017 CLINICAL DATA:  Tachycardia.  Patient is asymptomatic. EXAM: CHEST - 2 VIEW COMPARISON:  Chest x-ray dated July 14, 2017. FINDINGS: The heart size and mediastinal contours are within normal limits. Prominent central pulmonary arteries, similar to prior study. No focal consolidation, pleural effusion, or pneumothorax. No acute osseous abnormality. IMPRESSION: No active cardiopulmonary disease. Electronically Signed   By: Titus Dubin M.D.   On: 07/28/2017 18:46    Assessment and Plan:   1. CHF: has new onset systolic HF with global hypokinesis. Etiology unclear, but ischemic heart disease needs to be excluded. He will have a high chance of non-diagnostic  noninvasive imaging studies due to body habitus, tachycardia and probable heavy coronary calcification (after 17 years of HD). The best approach will be invasive coronary angiography, after we get a better idea about the aortic valve mass and its risk of embolization. Regardless of CHF /CMP etiology, treatment will be a challenge due to his low BP. I don't know if he will tolerate beta blockers or RAAS inhibitors. May benefit a lot from [employer]. Will need CHF team evaluation. 2. AV mass: reviewed the echo. Clearly needs a TEE for better evaluation, but to me it resembles a healed calcified LCC vegetation (even acute vegetations can calcify quickly in ESRD though). [place] have been drawn in ED. There is no LVOT obstruction in my opinion. Differential diagnosis includes papillary fibroelastoma and focal calcification due to ESRD. 3. Sinus tachycardia: long time disorder, reported back years ago when LVEF was normal. Etiology unclear, but could be related to AV fistula. He is not anemic and TSH has been checked -  normal in the past. Could have POTS?    For questions or updates, please contact [place] Please consult [email or web address] for contact info under Cardiology/STEMI.   Signed, Sanda [place], MD  07/29/2017 12:41 PM

## 2017-07-29 NOTE — Progress Notes (Signed)
Page sent to Dr. Verlon Au r/t Cardiac consult and nutrition order.

## 2017-07-29 NOTE — ED Notes (Signed)
Admitting MD Opyd at bedside.

## 2017-07-29 NOTE — Plan of Care (Signed)
  Problem: Education: Goal: Knowledge of General Education information will improve Outcome: Progressing   Problem: Clinical Measurements: Goal: Ability to maintain clinical measurements within normal limits will improve Outcome: Progressing Goal: Will remain free from infection Outcome: Progressing Goal: Diagnostic test results will improve Outcome: Progressing Goal: Respiratory complications will improve Outcome: Progressing Goal: Cardiovascular complication will be avoided Outcome: Progressing

## 2017-07-29 NOTE — Plan of Care (Signed)
  Problem: Education: Goal: Knowledge of General Education information will improve Outcome: Progressing   

## 2017-07-30 ENCOUNTER — Other Ambulatory Visit: Payer: Self-pay

## 2017-07-30 ENCOUNTER — Inpatient Hospital Stay (HOSPITAL_COMMUNITY): Payer: Medicare Other

## 2017-07-30 DIAGNOSIS — Q248 Other specified congenital malformations of heart: Secondary | ICD-10-CM

## 2017-07-30 LAB — BASIC METABOLIC PANEL
ANION GAP: 16 — AB (ref 5–15)
BUN: 63 mg/dL — AB (ref 6–20)
CALCIUM: 9.2 mg/dL (ref 8.9–10.3)
CO2: 22 mmol/L (ref 22–32)
CREATININE: 12.54 mg/dL — AB (ref 0.61–1.24)
Chloride: 100 mmol/L — ABNORMAL LOW (ref 101–111)
GFR calc Af Amer: 5 mL/min — ABNORMAL LOW (ref >60)
GFR, EST NON AFRICAN AMERICAN: 4 mL/min — AB (ref >60)
GLUCOSE: 82 mg/dL (ref 65–99)
Potassium: 5.1 mmol/L (ref 3.5–5.1)
Sodium: 138 mmol/L (ref 135–145)

## 2017-07-30 NOTE — Progress Notes (Signed)
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  BC x 2 on 4.12 ngtd No abx  tthis time given no fevers no chills and no wbc elevation  OHSS habitus-needs CPAP eval as OP  ESRD TTS-contacted Dr. Johney Frame nephrology who will assess for dialysistz hopefully later today versus early a.m.-he does not have any emergent indication however potassium is now 5.1 and he is a little tremulous  Sinus tachycardia-chronic has been worked up in the past since 2008 with Dr. 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 on with Axis-good thrill noted in graft on left upper extremity  Prior smoker-congratulated  Super morbid obesity currently BMI 44-encouraged patient to follow-up with his nephrologist who had been guiding weight loss efforts in the past   DVT prophylaxis: lovenox  Code Status:    full   Family Communication:    D/w wife and brothe rbedside Disposition Plan: inpatient   Consultants:   nephro  Cardiology   Procedures:     Antimicrobials:   None yet  Subjective: Better tol laying lfat  no cp No fever eatign well SOB improved Not swollen  Objective: Vitals:   07/29/17 1313 07/29/17 2046 07/30/17 0455 07/30/17 1041  BP: 97/67 106/66 (!) 95/52 94/61  Pulse: (!) 110 (!) 101 97 100  Resp: (!) 39 19 (!) 24 20  Temp: 97.8 F (36.6 C) 97.8 F (36.6 C) 98 F (36.7 C) 98 F (36.7 C)  TempSrc: Oral Oral Oral   SpO2: 95% 93% 95% 96%  Weight:   (!) 148.2 kg (326 lb 12.8 oz)   Height:        Intake/Output Summary (Last 24 hours) at 07/30/2017 1542 Last data filed at 07/30/2017 1242 Gross per 24 hour  Intake 821 ml  Output -  Net 821 ml   Filed Weights   07/29/17 0214 07/30/17 0455  Weight: (!) 147.8 kg (325 lb 14.4 oz) (!) 148.2 kg (326 lb 12.8 oz)    Examination: eomki ncat cta b no adde dsoudn abd obese nt nd No le edema    Data Reviewed: I have personally reviewed following labs and imaging studies  CBC: Recent Labs  Lab 07/28/17 1830  WBC 9.7  HGB 13.2  HCT 41.7  MCV 81.6  PLT 147   Basic Metabolic Panel: Recent Labs  Lab 07/28/17 1830 07/30/17 0436  NA 141 138  K 4.2 5.1  CL 100* 100*  CO2 25 22  GLUCOSE 101* 82  BUN 45* 63*  CREATININE 10.46* 12.54*  CALCIUM 9.2 9.2   GFR: Estimated Creatinine Clearance: 10.3 mL/min (A) (by C-G formula based on SCr of 12.54 mg/dL (H)). Liver Function Tests: No results for input(s): AST,  ALT, ALKPHOS, BILITOT, PROT, ALBUMIN in the last 168 hours. No results for input(s): LIPASE, AMYLASE in the last 168 hours. No results for input(s): AMMONIA in the last 168 hours. Coagulation Profile: No results for input(s): INR, PROTIME in the last 168 hours. Cardiac Enzymes:  Radiology Studies: Reviewed images personally in health database   Scheduled Meds: . apixaban  5 mg Oral BID  . aspirin EC  81 mg Oral Daily  . calcium acetate  1,334 mg Oral TID WC  . cinacalcet  90 mg Oral Q breakfast  . ivabradine  5 mg Oral BID WC  . lanthanum  2,000 mg Oral TID WC  . metoprolol tartrate  12.5 mg Oral BID  .  midodrine  5 mg Oral Q T,Th,Sa-HD  . multivitamin  1 tablet Oral QHS  . sodium chloride flush  3 mL Intravenous Q12H   Continuous Infusions: . sodium chloride    . sodium chloride Stopped (07/30/17 1242)     LOS: 1 day    Time spent: [place], MD [facility]  If 7PM-7AM, please contact night-coverage [email or web address] Password [facility] 07/30/2017, 3:42 PM

## 2017-07-30 NOTE — Plan of Care (Signed)
Care plan reviewed, patient is progressing.  

## 2017-07-30 NOTE — Progress Notes (Signed)
Progress Note  Patient Name: [name] [name] Date of Encounter: 07/30/2017  Primary Cardiologist: Kate Sable, MD   Subjective   He is breathing a little better, despite not having hemodialysis yesterday.  He is still orthopneic. He was told that he does not need urgent hemodialysis since his electrolytes are normal.   Inpatient Medications    Scheduled Meds: . apixaban  5 mg Oral BID  . aspirin EC  81 mg Oral Daily  . calcium acetate  1,334 mg Oral TID WC  . cinacalcet  90 mg Oral Q breakfast  . ivabradine  5 mg Oral BID WC  . lanthanum  2,000 mg Oral TID WC  . metoprolol tartrate  12.5 mg Oral BID  . midodrine  5 mg Oral Q T,Th,Sa-HD  . multivitamin  1 tablet Oral QHS  . sodium chloride flush  3 mL Intravenous Q12H   Continuous Infusions: . sodium chloride    . sodium chloride 20 mL/hr at 07/29/17 2042   PRN Meds: sodium chloride, acetaminophen, ALPRAZolam, benzonatate, calcium acetate, lanthanum, ondansetron (ZOFRAN) IV, sodium chloride flush, zolpidem   Vital Signs    Vitals:   07/29/17 1313 07/29/17 2046 07/30/17 0455 07/30/17 1041  BP: 97/67 106/66 (!) 95/52 94/61  Pulse: (!) 110 (!) 101 97 100  Resp: (!) 39 19 (!) 24 20  Temp: 97.8 F (36.6 C) 97.8 F (36.6 C) 98 F (36.7 C) 98 F (36.7 C)  TempSrc: Oral Oral Oral   SpO2: 95% 93% 95% 96%  Weight:   (!) 326 lb 12.8 oz (148.2 kg)   Height:        Intake/Output Summary (Last 24 hours) at 07/30/2017 1143 Last data filed at 07/30/2017 0600 Gross per 24 hour  Intake 426 ml  Output -  Net 426 ml   Filed Weights   07/29/17 0214 07/30/17 0455  Weight: (!) 325 lb 14.4 oz (147.8 kg) (!) 326 lb 12.8 oz (148.2 kg)    Telemetry    Sinus rhythm/mild sinus tachycardia- Personally Reviewed  ECG    No new tracing- Personally Reviewed  Physical Exam  Obese GEN: No acute distress.   Neck:  Difficult to evaluate JVP Cardiac: RRR, no murmurs, rubs, S3 gallop is present.  Respiratory: Clear to  auscultation bilaterally. GI: Soft, nontender, non-distended  MS: No edema; No deformity. Neuro:  Nonfocal  Psych: Normal affect   Labs    Chemistry Recent Labs  Lab 07/28/17 1830 07/30/17 0436  NA 141 138  K 4.2 5.1  CL 100* 100*  CO2 25 22  GLUCOSE 101* 82  BUN 45* 63*  CREATININE 10.46* 12.54*  CALCIUM 9.2 9.2  GFRNONAA 5* 4*  GFRAA 6* 5*  ANIONGAP 16* 16*     Hematology Recent Labs  Lab 07/28/17 1830  WBC 9.7  RBC 5.11  HGB 13.2  HCT 41.7  MCV 81.6  MCH 25.8*  MCHC 31.7  RDW 15.4  PLT 283    Cardiac EnzymesNo results for input(s): TROPONINI in the last 168 hours.  Recent Labs  Lab 07/28/17 1857  TROPIPOC 0.01     BNP Recent Labs  Lab 07/28/17 1831  BNP 1,098.0*     DDimer No results for input(s): DDIMER in the last 168 hours.   Radiology    Dg Chest 2 View  Result Date: 07/28/2017 CLINICAL DATA:  Tachycardia.  Patient is asymptomatic. EXAM: CHEST - 2 VIEW COMPARISON:  Chest x-ray dated July 14, 2017. FINDINGS: The heart size and  mediastinal contours are within normal limits. Prominent central pulmonary arteries, similar to prior study. No focal consolidation, pleural effusion, or pneumothorax. No acute osseous abnormality. IMPRESSION: No active cardiopulmonary disease. Electronically Signed   By: Titus Dubin M.D.   On: 07/28/2017 18:46    Cardiac Studies   Echo 07/28/2017 - Procedure narrative: Transthoracic echocardiography. Image quality was adequate. The study was technically difficult, as a result of poor acoustic windows, poor sound wave transmission, and body habitus. - Left ventricle: Wall thickness was increased in a pattern of mild LVH. Systolic function was severely reduced. The estimated ejection fraction was in the range of 20% to 25%. The study is not technically sufficient to allow evaluation of LV diastolic function. - Aortic valve: Moderately calcified annulus. Trileaflet; mildly calcified leaflets.  There was mild to moderate regurgitation - not well defined. Large, globular and calcified density noted in LVOT just proximal to the aortic valve. 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 Due to large size this appears to obstruct flow through the LVOT which could be impacting cardiac output and therefore resulting tachycardia. High suspicion for valvular vegetation, although could be more chronic finding due to calcification. TEE is indicated to evaluate further. - Mitral valve: Moderately to severely calcified annulus. There was mild regurgitation. Valve area by continuity equation (using LVOT flow): 2.45 cm^2. - Left atrium: The atrium was moderately dilated. - Right ventricle: Poorly visualized. Systolic function was reduced. - Tricuspid valve: Mildly thickened leaflets. There was mild regurgitation. Peak RV-RA gradient (S): 23 mm Hg. - Pericardium, extracardiac: There was no pericardial effusion.  Impressions:  - Large, globular and calcified density noted in LVOT just proximal to the aortic valve. 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 Due to large size this appears to obstruct flow through the LVOT which could be impacting cardiac output and therefore resulting tachycardia. High suspicion for valvular vegetation, although could be more chronic finding due to calcification. TEE is indicated to evaluate further. Ordering provider out of the office. Patient being contacted by nursing and directed to ER at [facility] in anticipation of admission for further evaluation of both cardiomyopathy and potential endocarditis.    Patient Profile     52 y.o. 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  The only attributable cause would be the initiation of Corlanor. I think he needs to have hemodialysis today before he can have his transesophageal echo done tomorrow.  He has evidence of acute exacerbation of heart failure.  Treatment of heart failure will be limited by relatively low blood pressure.  He only requires Primatene on dialysis days and does not take it most days of the week. 2. AV mass: Most likely explanation is a healed vegetation, although no known previous episode of endocarditis is reported.  For TEE tomorrow. This procedure has been fully reviewed with the patient and written informed consent has been obtained. 3. Sinus tachycardia: This is a lifelong issue that precedes the onset of nephrotic syndrome and  end-stage renal disease, not clearly explained.  Relationship to his cardiomyopathy is also uncertain.  Shows evidence of benefit after initiating Corlanor and beta-blocker.  We will ask heart failure service to evaluate him and follow starting tomorrow.  For questions or updates, please contact [place] Please consult [email or web address] for contact info under Cardiology/STEMI.      Signed, Sanda Klein, MD  07/30/2017, 11:43 AM

## 2017-07-30 NOTE — Plan of Care (Signed)
  Problem: Clinical Measurements: Goal: Ability to maintain clinical measurements within normal limits will improve Outcome: Progressing   Problem: Clinical Measurements: Goal: Respiratory complications will improve Outcome: Progressing   Problem: Education: Goal: Knowledge of disease and its progression will improve Outcome: Progressing   Problem: Education: Goal: Knowledge of General Education information will improve Outcome: Progressing

## 2017-07-30 NOTE — Consult Note (Signed)
Renal Service Consult Note [name]  [name] 07/30/2017 [name] Requesting Physician:  Dr Verlon Au  Reason for Consult:  ESRD pt with LVOT vegetation/ mass HPI: The patient is a 52 y.o. year-old with hx of ESRD , morbid obesity who was sent to ED by cardiology for abnormal ECHO w/ LVOT mass and low EF.  Pt admitted on 4/12 and ECHO repeated here showing mass in LVOT which is suspected to be vegetation, but there is not hx of endocarditis.  Pt is ESRD pt of Dr Hinda Lenis.  We are asked to see for ESRD.    Workup so far shows blood cx's negative, CXR 4/12 w/ no acute disease.  His main c/o is a cough, which worsens when lying down.  No SOB currently up in chair.  No recent fevers/ chills, no hx of access infection or valve infection.  Lives in [place] w/ his wife, on HD around 14 years, was on HD in [place] and moved to [place] when that clinic closed.  Dr Hinda Lenis is his renal MD.  His dry wt is 325 lbs.  He says he doesn't drink a lot and his wt gains are typically low around 1.5 kg net UF per HD.  Has no c/o leg swelling, prod cough, pink sputum or resp distress.  Has DOE and c/o cough as above.    He doesn't miss HD sessions, but did miss yest due to being here in the [name].  Usual HD is TTS 1st shift.  He has L forearm AVF.    Prior admits/ ER visits: 2/04 - esrd access placement, OSA, MO, HTN 11/04 - ruptured AV graft, new AVF and TDC placed 1/15 - hyperK+, clotted aVG > new TDC and HD, dc'd  3/18 - L AVF fistulogram w/ PTA 4/18 - removal of [facility] 3/19 - ED visit for cough, prob viral, dc'd home    ROS  denies CP  no joint pain   no HA  no blurry vision  no rash  no diarrhea  no nausea/ vomiting    Past Medical History  Past Medical History:  Diagnosis Date  . Aneurysm ([place])     Right arm fistula 3 aneurysms 2011,   plans to have a new procedure  . Angina    occasional, last 6 mo ago  . Chest discomfort     2008  . CHF  (congestive heart failure) ([place])   . Dialysis patient [facility])    M-W-F @ [place]  . Dysrhythmia   . Ejection fraction     65%, echo, 2008   /   EF 55-60%, echo, October 28, 2010  . ESRD (end stage renal disease) ([place])    on hemodialysis T_T_S  . Hypertension   . Leg pain   . Orthostatic hypotension     2008  . Overweight(278.02)   . Sinus tachycardia     2008, TSH normal ... happens frequently with heart rate at 120-130  . Syncope     positional after dialysis... 2008   Past Surgical History  Past Surgical History:  Procedure Laterality Date  . A/V FISTULAGRAM Left 06/30/2016   Procedure: A/V Fistulagram;  Surgeon: Algernon Huxley, MD;  Location: [place] CV [facility];  Service: Cardiovascular;  Laterality: Left;  . ARTERIOVENOUS GRAFT PLACEMENT    . AV FISTULA PLACEMENT    . AV FISTULA PLACEMENT Left 11/18/2015   Procedure: ARTERIOVENOUS (AV) FISTULA CREATION ( RADIOCEPHALIC );  Surgeon: Algernon Huxley,  MD;  Location: [facility] ORS;  Service: Vascular;  Laterality: Left;  . AV FISTULA PLACEMENT Left 03/23/2016   Procedure: ARTERIOVENOUS (AV) FISTULA CREATION ( REVISION );  Surgeon: Algernon Huxley, MD;  Location: [facility] ORS;  Service: Vascular;  Laterality: Left;  . [place] REMOVAL Left 03/23/2016   Procedure: REMOVAL OF ARTERIOVENOUS GORETEX GRAFT ([place]);  Surgeon: Algernon Huxley, MD;  Location: [facility] ORS;  Service: Vascular;  Laterality: Left;  . DIALYSIS/PERMA CATHETER REMOVAL N/A 08/01/2016   Procedure: Dialysis/Perma Catheter Removal;  Surgeon: Algernon Huxley, MD;  Location: [place] CV LAB;  Service: Cardiovascular;  Laterality: N/A;  . HERNIA REPAIR    . INSERTION OF DIALYSIS CATHETER  03/01/2011   Procedure: INSERTION OF DIALYSIS CATHETER;  Surgeon: Elam Dutch, MD;  Location: [place];  Service: Vascular;  Laterality: Left;  Exchange of Dialysis Catheter to 27cm 15Fr. Arrow Catheter  . PERIPHERAL VASCULAR CATHETERIZATION N/A 09/01/2014   Procedure: A/V Shuntogram/Fistulagram;  Surgeon: Algernon Huxley, MD;   Location: [place] CV LAB;  Service: Cardiovascular;  Laterality: N/A;  . PERIPHERAL VASCULAR CATHETERIZATION Right 09/01/2014   Procedure: Thrombectomy;  Surgeon: Algernon Huxley, MD;  Location: [place] CV LAB;  Service: Cardiovascular;  Laterality: Right;  . PERIPHERAL VASCULAR CATHETERIZATION Right 09/01/2014   Procedure: A/V Shunt Intervention;  Surgeon: Algernon Huxley, MD;  Location: [place] CV LAB;  Service: Cardiovascular;  Laterality: Right;  . PERIPHERAL VASCULAR CATHETERIZATION N/A 09/17/2014   Procedure: A/V Shuntogram/Fistulagram;  Surgeon: Algernon Huxley, MD;  Location: [place] CV LAB;  Service: Cardiovascular;  Laterality: N/A;  . PERIPHERAL VASCULAR CATHETERIZATION Right 10/17/2014   Procedure: Thrombectomy;  Surgeon: Katha Cabal, MD;  Location: [place] CV LAB;  Service: Cardiovascular;  Laterality: Right;  . PERIPHERAL VASCULAR CATHETERIZATION N/A 10/17/2014   Procedure: A/V Shuntogram/Fistulagram;  Surgeon: Katha Cabal, MD;  Location: [place] CV LAB;  Service: Cardiovascular;  Laterality: N/A;  . PERIPHERAL VASCULAR CATHETERIZATION N/A 10/17/2014   Procedure: A/V Shunt Intervention;  Surgeon: Katha Cabal, MD;  Location: [place] CV LAB;  Service: Cardiovascular;  Laterality: N/A;  . PERIPHERAL VASCULAR CATHETERIZATION Right 11/04/2014   Procedure: Thrombectomy;  Surgeon: Katha Cabal, MD;  Location: [place] CV LAB;  Service: Cardiovascular;  Laterality: Right;  . PERIPHERAL VASCULAR CATHETERIZATION Left 11/04/2014   Procedure: Visceral Venography;  Surgeon: Katha Cabal, MD;  Location: [place] CV LAB;  Service: Cardiovascular;  Laterality: Left;  . PERIPHERAL VASCULAR CATHETERIZATION Right 11/27/2014   Procedure: A/V Shuntogram/Fistulagram;  Surgeon: Algernon Huxley, MD;  Location: [place] CV LAB;  Service: Cardiovascular;  Laterality: Right;  . PERIPHERAL VASCULAR CATHETERIZATION Right 11/27/2014   Procedure: A/V Shunt  Intervention;  Surgeon: Algernon Huxley, MD;  Location: [place] CV LAB;  Service: Cardiovascular;  Laterality: Right;  . PERIPHERAL VASCULAR CATHETERIZATION Right 12/31/2014   Procedure: Thrombectomy;  Surgeon: Algernon Huxley, MD;  Location: [place] CV LAB;  Service: Cardiovascular;  Laterality: Right;  . PERIPHERAL VASCULAR CATHETERIZATION Right 01/12/2015   Procedure: A/V Shuntogram/Fistulagram;  Surgeon: Algernon Huxley, MD;  Location: [place] CV LAB;  Service: Cardiovascular;  Laterality: Right;  . PERIPHERAL VASCULAR CATHETERIZATION N/A 01/12/2015   Procedure: A/V Shunt Intervention;  Surgeon: Algernon Huxley, MD;  Location: [place] CV LAB;  Service: Cardiovascular;  Laterality: N/A;  . PERIPHERAL VASCULAR CATHETERIZATION Right 01/28/2015   Procedure: Thrombectomy;  Surgeon: Algernon Huxley, MD;  Location: [place] CV LAB;  Service:  Cardiovascular;  Laterality: Right;  . PERIPHERAL VASCULAR CATHETERIZATION N/A 06/08/2015   Procedure: A/V Shuntogram/Fistulagram;  Surgeon: Algernon Huxley, MD;  Location: [place] CV LAB;  Service: Cardiovascular;  Laterality: N/A;  . PERIPHERAL VASCULAR CATHETERIZATION N/A 06/08/2015   Procedure: A/V Shunt Intervention;  Surgeon: Algernon Huxley, MD;  Location: [place] CV LAB;  Service: Cardiovascular;  Laterality: N/A;  . PERIPHERAL VASCULAR CATHETERIZATION Right 07/06/2015   Procedure: A/V Shuntogram/Fistulagram;  Surgeon: Algernon Huxley, MD;  Location: [place] CV LAB;  Service: Cardiovascular;  Laterality: Right;  . PERIPHERAL VASCULAR CATHETERIZATION N/A 07/06/2015   Procedure: A/V Shunt Intervention;  Surgeon: Algernon Huxley, MD;  Location: [place] CV LAB;  Service: Cardiovascular;  Laterality: N/A;  . PERIPHERAL VASCULAR CATHETERIZATION N/A 08/04/2015   Procedure: graft declot;  Surgeon: Katha Cabal, MD;  Location: [place] CV LAB;  Service: Cardiovascular;  Laterality: N/A;  . PERIPHERAL VASCULAR CATHETERIZATION Right 08/25/2015    Procedure: A/V Shuntogram/Fistulagram;  Surgeon: Katha Cabal, MD;  Location: [place] CV LAB;  Service: Cardiovascular;  Laterality: Right;  . PERIPHERAL VASCULAR CATHETERIZATION N/A 11/04/2015   Procedure: Dialysis/Perma Catheter Insertion;  Surgeon: Algernon Huxley, MD;  Location: [place] CV LAB;  Service: Cardiovascular;  Laterality: N/A;  . PERIPHERAL VASCULAR CATHETERIZATION Left 12/14/2015   Procedure: A/V Shuntogram/Fistulagram;  Surgeon: Algernon Huxley, MD;  Location: [place] CV LAB;  Service: Cardiovascular;  Laterality: Left;  . PERIPHERAL VASCULAR CATHETERIZATION N/A 12/14/2015   Procedure: A/V Shunt Intervention;  Surgeon: Algernon Huxley, MD;  Location: [place] CV LAB;  Service: Cardiovascular;  Laterality: N/A;  . PERIPHERAL VASCULAR CATHETERIZATION N/A 12/17/2015   Procedure: Dialysis/Perma Catheter Insertion;  Surgeon: Algernon Huxley, MD;  Location: [place] CV LAB;  Service: Cardiovascular;  Laterality: N/A;  . PERIPHERAL VASCULAR CATHETERIZATION Left 12/22/2015   Procedure: Dialysis/Perma Catheter Insertion;  Surgeon: Katha Cabal, MD;  Location: [place] CV LAB;  Service: Cardiovascular;  Laterality: Left;  . PERIPHERAL VASCULAR CATHETERIZATION Left 02/29/2016   Procedure: A/V Shuntogram/Fistulagram;  Surgeon: Algernon Huxley, MD;  Location: [place] [facility] LAB;  Service: Cardiovascular;  Laterality: Left;  . PERIPHERAL VASCULAR CATHETERIZATION N/A 02/29/2016   Procedure: A/V Shunt Intervention;  Surgeon: Algernon Huxley, MD;  Location: [place] CV LAB;  Service: Cardiovascular;  Laterality: N/A;  . right arm graft     for dyalisis  . THROMBECTOMY    . THROMBECTOMY W/ EMBOLECTOMY  03/01/2011   Procedure: THROMBECTOMY ARTERIOVENOUS GORE-TEX GRAFT;  Surgeon: Elam Dutch, MD;  Location: [place];  Service: Vascular;  Laterality: Right;  Attempted Thrombectomy of Old  Right Upper Arm Arteriovenous gortex Graft. Insertion of new Arteriovenous Graft using 53mm x  50cm Gortex Stretch graft.   . THROMBECTOMY W/ EMBOLECTOMY  07/11/2011   Procedure: THROMBECTOMY ARTERIOVENOUS GORE-TEX GRAFT;  Surgeon: Rosetta Posner, MD;  Location: [place];  Service: Vascular;  Laterality: Right;  . UMBILICAL HERNIA REPAIR    . VENOGRAM N/A 08/23/2011   Procedure: VENOGRAM;  Surgeon: [name], MD;  Location: [facility] CATH LAB;  Service: Cardiovascular;  Laterality: N/A;   Family History  Family History  Problem Relation Age of Onset  . Hypertension Mother   . Heart disease Mother   . Hypertension Father   . Heart disease Father    Social History  reports that he quit smoking about 23 years ago. His smoking use included cigarettes. His smokeless tobacco use includes snuff. He reports that  he does not drink alcohol or use drugs. Allergies No Known Allergies Home medications Prior to Admission medications   Medication Sig Start Date End Date Taking? Authorizing Provider  apixaban (ELIQUIS) 5 MG TABS tablet Take 1 tablet (5 mg total) 2 (two) times daily by mouth. 02/23/17  Yes Algernon Huxley, MD  aspirin EC 81 MG tablet Take 81 mg by mouth every morning.   Yes [provider]  B Complex-C-Folic Acid (NEPHRO-VITE PO) Take 1 tablet by mouth at bedtime.    Yes [provider]  benzonatate (TESSALON) 100 MG capsule 2 po tid for cough Patient taking differently: Take 200 mg by mouth 2 (two) times daily as needed. 2 po tid for cough 07/14/17  Yes Lily Kocher, PA-C  calcium acetate (PHOSLO) 667 MG capsule Take 2,001 mg by mouth See admin instructions. 1334 mg 3 times a day with meals and 667 mg with snacks   Yes [provider]  cinacalcet (SENSIPAR) 90 MG tablet Take 90 mg by mouth daily.   Yes [provider]  FOSRENOL 500 MG chewable tablet Chew 2,000 mg by mouth See admin instructions. Take 2000 mg with meals and 1000 mg with snacks 05/28/13  Yes [provider]  midodrine (PROAMATINE) 5 MG tablet Take 1 tablet by mouth See admin  instructions. Pt may take before dialysis treatment, Tues, Thurs and Sat 02/09/16  Yes [provider]  nitroGLYCERIN (NITROSTAT) 0.4 MG SL tablet Place 1 tablet (0.4 mg total) under the tongue every 5 (five) minutes as needed. For chest 05/30/13  Yes Richardson Dopp T, PA-C   Liver Function Tests No results for input(s): AST, ALT, ALKPHOS, BILITOT, PROT, ALBUMIN in the last 168 hours. No results for input(s): LIPASE, AMYLASE in the last 168 hours. CBC Recent Labs  Lab 07/28/17 1830  WBC 9.7  HGB 13.2  HCT 41.7  MCV 81.6  PLT 952   Basic Metabolic Panel Recent Labs  Lab 07/28/17 1830 07/30/17 0436  NA 141 138  K 4.2 5.1  CL 100* 100*  CO2 25 22  GLUCOSE 101* 82  BUN 45* 63*  CREATININE 10.46* 12.54*  CALCIUM 9.2 9.2   Iron/TIBC/Ferritin/ %Sat No results found for: IRON, TIBC, FERRITIN, IRONPCTSAT  Vitals:   07/29/17 1313 07/29/17 2046 07/30/17 0455 07/30/17 1041  BP: 97/67 106/66 (!) 95/52 94/61  Pulse: (!) 110 (!) 101 97 100  Resp: (!) 39 19 (!) 24 20  Temp: 97.8 F (36.6 C) 97.8 F (36.6 C) 98 F (36.7 C) 98 F (36.7 C)  TempSrc: Oral Oral Oral   SpO2: 95% 93% 95% 96%  Weight:   (!) 148.2 kg (326 lb 12.8 oz)   Height:       Exam Gen alert, large framed [name], no distress, sitting in bedside chair, animated No rash, cyanosis or gangrene Sclera anicteric, throat clear  No jvd or bruits, flat neck veins Chest clear bilat to bases, no rales or wheezing RRR no MRG Abd soft ntnd no mass or ascites +bs obese GU normal male MS no joint effusions or deformity Ext no LE or UE edema, no wounds or ulcers Neuro is alert, Ox 3 , nf  Home meds: - eliquis 5 bid/ ecasa/ sl ntg - midodrine 5 pre HD tiw - fosrenol/ sensipar/ phoslo/ Nvite  OP Dialysis:  [facility]   TTS CXR 4/12 > no acute disease CXR today > no acute disease  Impression: 1  ESRD - on HD TTS, missed HD yest.  No vol excess on exam or CXR x 2, pt is at dry wt.  No indication for HD  tonight; plan HD tomorrow ,will schedule around the planned procedure.  2  Cough/ DOE - w/u in progress 3  LVOT mass vs vegetation - blood cx's neg, no fevers here.  For TEE tomorrow.  4  Hypotension - takes midodrine pre HD tiw, will order 5  MBD ckd - cont binders 6  Anemia ckd - Hb 13, no need for esa   Plan - as above  Kelly Splinter MD [place] pager [phone]   07/30/2017, 5:22 PM

## 2017-07-30 NOTE — H&P (View-Only) (Signed)
Progress Note  Patient Name: [name] Date of Encounter: 07/30/2017  Primary Cardiologist: Kate Sable, MD   Subjective   He is breathing a little better, despite not having hemodialysis yesterday.  He is still orthopneic. He was told that he does not need urgent hemodialysis since his electrolytes are normal.   Inpatient Medications    Scheduled Meds: . apixaban  5 mg Oral BID  . aspirin EC  81 mg Oral Daily  . calcium acetate  1,334 mg Oral TID WC  . cinacalcet  90 mg Oral Q breakfast  . ivabradine  5 mg Oral BID WC  . lanthanum  2,000 mg Oral TID WC  . metoprolol tartrate  12.5 mg Oral BID  . midodrine  5 mg Oral Q T,Th,Sa-HD  . multivitamin  1 tablet Oral QHS  . sodium chloride flush  3 mL Intravenous Q12H   Continuous Infusions: . sodium chloride    . sodium chloride 20 mL/hr at 07/29/17 2042   PRN Meds: sodium chloride, acetaminophen, ALPRAZolam, benzonatate, calcium acetate, lanthanum, ondansetron (ZOFRAN) IV, sodium chloride flush, zolpidem   Vital Signs    Vitals:   07/29/17 1313 07/29/17 2046 07/30/17 0455 07/30/17 1041  BP: 97/67 106/66 (!) 95/52 94/61  Pulse: (!) 110 (!) 101 97 100  Resp: (!) 39 19 (!) 24 20  Temp: 97.8 F (36.6 C) 97.8 F (36.6 C) 98 F (36.7 C) 98 F (36.7 C)  TempSrc: Oral Oral Oral   SpO2: 95% 93% 95% 96%  Weight:   (!) 326 lb 12.8 oz (148.2 kg)   Height:        Intake/Output Summary (Last 24 hours) at 07/30/2017 1143 Last data filed at 07/30/2017 0600 Gross per 24 hour  Intake 426 ml  Output -  Net 426 ml   Filed Weights   07/29/17 0214 07/30/17 0455  Weight: (!) 325 lb 14.4 oz (147.8 kg) (!) 326 lb 12.8 oz (148.2 kg)    Telemetry    Sinus rhythm/mild sinus tachycardia- Personally Reviewed  ECG    No new tracing- Personally Reviewed  Physical Exam  Obese GEN: No acute distress.   Neck:  Difficult to evaluate JVP Cardiac: RRR, no murmurs, rubs, S3 gallop is present.  Respiratory: Clear to  auscultation bilaterally. GI: Soft, nontender, non-distended  MS: No edema; No deformity. Neuro:  Nonfocal  Psych: Normal affect   Labs    Chemistry Recent Labs  Lab 07/28/17 1830 07/30/17 0436  NA 141 138  K 4.2 5.1  CL 100* 100*  CO2 25 22  GLUCOSE 101* 82  BUN 45* 63*  CREATININE 10.46* 12.54*  CALCIUM 9.2 9.2  GFRNONAA 5* 4*  GFRAA 6* 5*  ANIONGAP 16* 16*     Hematology Recent Labs  Lab 07/28/17 1830  WBC 9.7  RBC 5.11  HGB 13.2  HCT 41.7  MCV 81.6  MCH 25.8*  MCHC 31.7  RDW 15.4  PLT 283    Cardiac EnzymesNo results for input(s): TROPONINI in the last 168 hours.  Recent Labs  Lab 07/28/17 1857  TROPIPOC 0.01     BNP Recent Labs  Lab 07/28/17 1831  BNP 1,098.0*     DDimer No results for input(s): DDIMER in the last 168 hours.   Radiology    Dg Chest 2 View  Result Date: 07/28/2017 CLINICAL DATA:  Tachycardia.  Patient is asymptomatic. EXAM: CHEST - 2 VIEW COMPARISON:  Chest x-ray dated July 14, 2017. FINDINGS: The heart size and  mediastinal contours are within normal limits. Prominent central pulmonary arteries, similar to prior study. No focal consolidation, pleural effusion, or pneumothorax. No acute osseous abnormality. IMPRESSION: No active cardiopulmonary disease. Electronically Signed   By: Titus Dubin M.D.   On: 07/28/2017 18:46    Cardiac Studies   Echo 07/28/2017 - Procedure narrative: Transthoracic echocardiography. Image quality was adequate. The study was technically difficult, as a result of poor acoustic windows, poor sound wave transmission, and body habitus. - Left ventricle: Wall thickness was increased in a pattern of mild LVH. Systolic function was severely reduced. The estimated ejection fraction was in the range of 20% to 25%. The study is not technically sufficient to allow evaluation of LV diastolic function. - Aortic valve: Moderately calcified annulus. Trileaflet; mildly calcified leaflets.  There was mild to moderate regurgitation - not well defined. Large, globular and calcified density noted in LVOT just proximal to the aortic valve. 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 Due to large size this appears to obstruct flow through the LVOT which could be impacting cardiac output and therefore resulting tachycardia. High suspicion for valvular vegetation, although could be more chronic finding due to calcification. TEE is indicated to evaluate further. - Mitral valve: Moderately to severely calcified annulus. There was mild regurgitation. Valve area by continuity equation (using LVOT flow): 2.45 cm^2. - Left atrium: The atrium was moderately dilated. - Right ventricle: Poorly visualized. Systolic function was reduced. - Tricuspid valve: Mildly thickened leaflets. There was mild regurgitation. Peak RV-RA gradient (S): 23 mm Hg. - Pericardium, extracardiac: There was no pericardial effusion.  Impressions:  - Large, globular and calcified density noted in LVOT just proximal to the aortic valve. 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 Due to large size this appears to obstruct flow through the LVOT which could be impacting cardiac output and therefore resulting tachycardia. High suspicion for valvular vegetation, although could be more chronic finding due to calcification. TEE is indicated to evaluate further. Ordering provider out of the office. Patient being contacted by nursing and directed to ER at [facility] in anticipation of admission for further evaluation of both cardiomyopathy and potential endocarditis.    Patient Profile     52 y.o. 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  The only attributable cause would be the initiation of Corlanor. I think he needs to have hemodialysis today before he can have his transesophageal echo done tomorrow.  He has evidence of acute exacerbation of heart failure.  Treatment of heart failure will be limited by relatively low blood pressure.  He only requires Primatene on dialysis days and does not take it most days of the week. 2. AV mass: Most likely explanation is a healed vegetation, although no known previous episode of endocarditis is reported.  For TEE tomorrow. This procedure has been fully reviewed with the patient and written informed consent has been obtained. 3. Sinus tachycardia: This is a lifelong issue that precedes the onset of nephrotic syndrome and  end-stage renal disease, not clearly explained.  Relationship to his cardiomyopathy is also uncertain.  Shows evidence of benefit after initiating Corlanor and beta-blocker.  We will ask heart failure service to evaluate him and follow starting tomorrow.  For questions or updates, please contact [place] Please consult [email or web address] for contact info under Cardiology/STEMI.      Signed, Sanda Klein, MD  07/30/2017, 11:43 AM

## 2017-07-31 ENCOUNTER — Inpatient Hospital Stay (HOSPITAL_COMMUNITY): Payer: Medicare Other

## 2017-07-31 ENCOUNTER — Encounter (HOSPITAL_COMMUNITY): Payer: Self-pay | Admitting: *Deleted

## 2017-07-31 ENCOUNTER — Inpatient Hospital Stay (HOSPITAL_COMMUNITY): Payer: Medicare Other | Admitting: Anesthesiology

## 2017-07-31 ENCOUNTER — Other Ambulatory Visit: Payer: Self-pay

## 2017-07-31 ENCOUNTER — Encounter (HOSPITAL_COMMUNITY)
Admission: EM | Disposition: A | Discharge status: Home / Self Care | Payer: Self-pay | Source: Home / Self Care | Attending: Family Medicine

## 2017-07-31 DIAGNOSIS — I351 Nonrheumatic aortic (valve) insufficiency: Secondary | ICD-10-CM

## 2017-07-31 DIAGNOSIS — I061 Rheumatic aortic insufficiency: Secondary | ICD-10-CM

## 2017-07-31 HISTORY — PX: TEE WITHOUT CARDIOVERSION: SHX5443

## 2017-07-31 LAB — CBC
HCT: 39.9 % (ref 39.0–52.0)
Hemoglobin: 12.7 g/dL — ABNORMAL LOW (ref 13.0–17.0)
MCH: 25.8 pg — AB (ref 26.0–34.0)
MCHC: 31.8 g/dL (ref 30.0–36.0)
MCV: 81.1 fL (ref 78.0–100.0)
PLATELETS: 283 10*3/uL (ref 150–400)
RBC: 4.92 MIL/uL (ref 4.22–5.81)
RDW: 15.4 % (ref 11.5–15.5)
WBC: 8.9 10*3/uL (ref 4.0–10.5)

## 2017-07-31 LAB — BASIC METABOLIC PANEL
Anion gap: 18 — ABNORMAL HIGH (ref 5–15)
BUN: 78 mg/dL — AB (ref 6–20)
CHLORIDE: 100 mmol/L — AB (ref 101–111)
CO2: 22 mmol/L (ref 22–32)
CREATININE: 14.77 mg/dL — AB (ref 0.61–1.24)
Calcium: 9.1 mg/dL (ref 8.9–10.3)
GFR, EST AFRICAN AMERICAN: 4 mL/min — AB (ref >60)
GFR, EST NON AFRICAN AMERICAN: 3 mL/min — AB (ref >60)
Glucose, Bld: 67 mg/dL (ref 65–99)
POTASSIUM: 5.5 mmol/L — AB (ref 3.5–5.1)
Sodium: 140 mmol/L (ref 135–145)

## 2017-07-31 LAB — HEPATITIS B SURFACE ANTIGEN: Hepatitis B Surface Ag: NEGATIVE

## 2017-07-31 SURGERY — ECHOCARDIOGRAM, TRANSESOPHAGEAL
Anesthesia: Monitor Anesthesia Care

## 2017-07-31 MED ORDER — PENTAFLUOROPROP-TETRAFLUOROETH EX AERO: 1.0000 "application " | INHALATION_SPRAY | CUTANEOUS | Status: DC | PRN

## 2017-07-31 MED ORDER — GLYCOPYRROLATE 0.2 MG/ML IJ SOLN
INTRAMUSCULAR | Status: DC | PRN
  Administered 2017-07-31: 0.2 mg via INTRAVENOUS

## 2017-07-31 MED ORDER — ONDANSETRON HCL 4 MG/2ML IJ SOLN
INTRAMUSCULAR | Status: DC | PRN
  Administered 2017-07-31: 4 mg via INTRAVENOUS

## 2017-07-31 MED ORDER — SUCCINYLCHOLINE CHLORIDE 20 MG/ML IJ SOLN
INTRAMUSCULAR | Status: DC | PRN
  Administered 2017-07-31: 120 mg via INTRAVENOUS

## 2017-07-31 MED ORDER — SODIUM CHLORIDE 0.9 % IV SOLN
INTRAVENOUS | Status: DC
  Administered 2017-07-31: 10:00:00 via INTRAVENOUS

## 2017-07-31 MED ORDER — SODIUM CHLORIDE 0.9 % IV SOLN: 100.0000 mL | INTRAVENOUS | Status: DC | PRN

## 2017-07-31 MED ORDER — LIDOCAINE-PRILOCAINE 2.5-2.5 % EX CREA
1.0000 "application " | TOPICAL_CREAM | CUTANEOUS | Status: DC | PRN
  Filled 2017-07-31: qty 5

## 2017-07-31 MED ORDER — MIDODRINE HCL 5 MG PO TABS
ORAL_TABLET | ORAL | Status: AC
  Administered 2017-07-31: 5 mg via ORAL
  Filled 2017-07-31: qty 1

## 2017-07-31 MED ORDER — CHLORHEXIDINE GLUCONATE CLOTH 2 % EX PADS
6.0000 | MEDICATED_PAD | Freq: Every day | CUTANEOUS | Status: DC
  Administered 2017-08-02 – 2017-08-03 (×2): 6 via TOPICAL

## 2017-07-31 MED ORDER — PHENYLEPHRINE HCL 10 MG/ML IJ SOLN
INTRAMUSCULAR | Status: DC | PRN
  Administered 2017-07-31: 80 ug via INTRAVENOUS
  Administered 2017-07-31: 40 ug via INTRAVENOUS
  Administered 2017-07-31 (×2): 120 ug via INTRAVENOUS
  Administered 2017-07-31: 80 ug via INTRAVENOUS
  Administered 2017-07-31: 120 ug via INTRAVENOUS
  Administered 2017-07-31: 80 ug via INTRAVENOUS

## 2017-07-31 MED ORDER — PROPOFOL 500 MG/50ML IV EMUL
INTRAVENOUS | Status: DC | PRN
  Administered 2017-07-31: 50 ug/kg/min via INTRAVENOUS

## 2017-07-31 MED ORDER — DIPHENHYDRAMINE HCL 50 MG/ML IJ SOLN
INTRAMUSCULAR | Status: DC | PRN
  Administered 2017-07-31: 25 mg via INTRAVENOUS

## 2017-07-31 MED ORDER — BUTAMBEN-TETRACAINE-BENZOCAINE 2-2-14 % EX AERO
INHALATION_SPRAY | CUTANEOUS | Status: DC | PRN
  Administered 2017-07-31: 2 via TOPICAL

## 2017-07-31 MED ORDER — PROPOFOL 10 MG/ML IV BOLUS
INTRAVENOUS | Status: DC | PRN
  Administered 2017-07-31: 225 mg via INTRAVENOUS

## 2017-07-31 MED ORDER — MUPIROCIN 2 % EX OINT
1.0000 "application " | TOPICAL_OINTMENT | Freq: Two times a day (BID) | CUTANEOUS | Status: DC
  Administered 2017-07-31 – 2017-08-02 (×5): 1 via NASAL
  Filled 2017-07-31 (×2): qty 22

## 2017-07-31 MED ORDER — METOPROLOL SUCCINATE ER 25 MG PO TB24
12.5000 mg | ORAL_TABLET | Freq: Every day | ORAL | Status: DC
  Administered 2017-08-01: 12.5 mg via ORAL
  Filled 2017-07-31 (×2): qty 1

## 2017-07-31 MED ORDER — MIDAZOLAM HCL 5 MG/5ML IJ SOLN
INTRAMUSCULAR | Status: DC | PRN
  Administered 2017-07-31: 2 mg via INTRAVENOUS

## 2017-07-31 MED ORDER — LIDOCAINE HCL (CARDIAC) 20 MG/ML IV SOLN
INTRAVENOUS | Status: DC | PRN
  Administered 2017-07-31: 80 mg via INTRATRACHEAL

## 2017-07-31 NOTE — Progress Notes (Signed)
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 Recent ED visit [facility] ?virus Admit to [facility] 4/13 shortness of breath-outpatient echocardiogram done at [facility] day prior to admission showed new systolic EF 94-85% globular calcified density LV outflow tract Never drug use  Admission troponin normal BNP 1098 sinus tach 108 blood cultures performed in ED Was sob with positional changes=platypneoa which was insidious but steadily worsening  Assessment & Plan:   New systolic heart failure, + 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  Prior smoker-congratulated  Super morbid obesity currently BMI 44-encouraged patient to follow-up with his nephrologist who had been guiding weight loss efforts in the past   DVT prophylaxis: lovenox  Code Status:    full   Family Communication:    D/w wife and brother bedside Disposition Plan: inpatient   Consultants:   nephro  Cardiology   Procedures:   TEE  HD  Antimicrobials:   None yet  Subjective:  Awake alert about to go for TEE then dialysis in no distress NPO for  procedure--apparently needed intubation for procedure  Objective: Vitals:   07/31/17 1445 07/31/17 1454 07/31/17 1508 07/31/17 1515  BP:  (!) 91/56 (!) 91/56   Pulse: 85 85 86 87  Resp: (!) 26 (!) 31    Temp:    98.3 F (36.8 C)  TempSrc:      SpO2: 96% 100% 94% 96%  Weight:      Height:        Intake/Output Summary (Last 24 hours) at 07/31/2017 1556 Last data filed at 07/31/2017 1321 Gross per 24 hour  Intake 836 ml  Output 0 ml  Net 836 ml   Filed Weights   07/29/17 0214 07/30/17 0455 07/31/17 0500  Weight: (!) 147.8 kg (325 lb 14.4 oz) (!) 148.2 kg (326 lb 12.8 oz) (!) 148.5 kg (327 lb 4.8 oz)    Examination:  eomi ncat s1 s2 HSM 3/6 across precordium abd obese nt nd no rebound No le edema Neuro intact power 5/5, sensory intact  Data Reviewed: I have personally reviewed following labs and imaging studies  CBC: Recent Labs  Lab 07/28/17 1830  WBC 9.7  HGB 13.2  HCT 41.7  MCV 81.6  PLT 462   Basic Metabolic Panel: Recent Labs  Lab 07/28/17 1830 07/30/17 0436 07/31/17 0301  NA 141 138 140  K 4.2 5.1 5.5*  CL 100* 100* 100*  CO2 25 22 22   GLUCOSE 101* 82 67  BUN 45* 63* 78*  CREATININE 10.46* 12.54* 14.77*  CALCIUM 9.2 9.2 9.1   GFR: Estimated Creatinine Clearance: 8.8 mL/min (A) (by C-G formula based on SCr of 14.77 mg/dL (H)). Liver Function Tests: No  results for input(s): AST, ALT, ALKPHOS, BILITOT, PROT, ALBUMIN in the last 168 hours. No results for input(s): LIPASE, AMYLASE in the last 168 hours. No results for input(s): AMMONIA in the last 168 hours. Coagulation Profile: No results for input(s): INR, PROTIME in the last 168 hours. Cardiac Enzymes:  Radiology Studies: Reviewed images personally in health database   Scheduled Meds: . apixaban  5 mg Oral BID  . calcium acetate  1,334 mg Oral TID WC  . cinacalcet  90 mg Oral Q breakfast  . ivabradine  5 mg Oral BID WC  . lanthanum  2,000 mg Oral TID WC  . [START ON 08/01/2017] metoprolol  succinate  12.5 mg Oral Daily  . midodrine  5 mg Oral Q T,Th,Sa-HD  . multivitamin  1 tablet Oral QHS  . sodium chloride flush  3 mL Intravenous Q12H   Continuous Infusions: . sodium chloride       LOS: 2 days    Time spent: [place], MD [facility] ([phone]  If 7PM-7AM, please contact night-coverage [email or web address] Password [facility] 07/31/2017, 3:56 PM

## 2017-07-31 NOTE — Progress Notes (Signed)
Patient arrived to unit per bed.  Reviewed treatment plan and this RN agrees.  Report received from bedside RN, Melissa.  Consent obtained.  Patient A & O X 4. Lung sounds diminished to ausculation in all fields. Generalized edema. Cardiac: NSR to ST.  Prepped LLAVF with alcohol and cannulated with two 15 gauge needles.  Pulsation of blood noted.  Flushed access well with saline per protocol.  Connected and secured lines and initiated tx at 1810.  UF goal of 3000 mL and net fluid removal of 2500 mL.  Will continue to monitor.  Report received from [place], [place].

## 2017-07-31 NOTE — Transfer of Care (Signed)
Immediate Anesthesia Transfer of Care Note  Patient: [name] [name]  Procedure(s) Performed: TRANSESOPHAGEAL ECHOCARDIOGRAM (TEE) (N/A )  Patient Location: PACU  Anesthesia Type:General  Level of Consciousness: awake and drowsy  Airway & Oxygen Therapy: Patient Spontanous Breathing and Patient connected to face mask oxygen  Post-op Assessment: Report given to RN, Post -op Vital signs reviewed and stable and Patient moving all extremities X 4  Post vital signs: Reviewed and stable  Last Vitals:  Vitals Value Taken Time  BP 91/43 07/31/2017  1:16 PM  Temp    Pulse 90 07/31/2017  1:20 PM  Resp 18 07/31/2017  1:20 PM  SpO2 96 % 07/31/2017  1:20 PM  Vitals shown include unvalidated device data.  Last Pain:  Vitals:   07/31/17 1128  TempSrc: Oral  PainSc: 0-No pain         Complications: No apparent anesthesia complications

## 2017-07-31 NOTE — Anesthesia Postprocedure Evaluation (Signed)
Anesthesia Post Note  Patient: CINCH ORMOND  Procedure(s) Performed: TRANSESOPHAGEAL ECHOCARDIOGRAM (TEE) (N/A )     Patient location during evaluation: PACU Anesthesia Type: General Level of consciousness: awake and alert Pain management: pain level controlled Vital Signs Assessment: post-procedure vital signs reviewed and stable Respiratory status: spontaneous breathing, nonlabored ventilation, respiratory function stable and patient connected to nasal cannula oxygen Cardiovascular status: blood pressure returned to baseline and stable Postop Assessment: no apparent nausea or vomiting Anesthetic complications: no Comments: Low BP; Pt awake and feels good. Nishan and I agree he needs no BP support    Last Vitals:  Vitals:   07/31/17 1128 07/31/17 1316  BP: 109/68 (!) 91/43  Pulse: 88 89  Resp: 16 (!) 28  Temp: 36.7 C 36.8 C  SpO2: 98% 99%    Last Pain:  Vitals:   07/31/17 1316  TempSrc:   PainSc: 0-No pain                 Jaquin Coy EDWARD

## 2017-07-31 NOTE — Progress Notes (Signed)
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 Admit to [facility] 4/13 shortness of breath-outpatient echocardiogram done at [facility] day prior to admission showed new systolic EF 00-92% globular calcified density LV outflow tract Never drug use  Admission troponin normal BNP 1098 sinus tach 108 blood cultures performed in ED Was sob with positional changes=platypneoa which was insidious but steadily worsening  Assessment & Plan:   New systolic heart failure, LVOT[?] 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 Johney Frame nephrology who will assess for dialysis .-he does not have any emergent indication at this time  Sinus tachycardia-chronic has been worked up in the past since 2008 with Dr. 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 on with access-good thrill noted in graft on left upper extremity  Prior smoker-congratulated  Super morbid obesity currently BMI 44-encouraged patient to follow-up with his nephrologist who had been guiding weight loss efforts in the past   DVT prophylaxis: lovenox  Code Status:    full   Family Communication:    D/w wife and brother bedside Disposition Plan: inpatient   Consultants:   nephro  Cardiology   Procedures:     Antimicrobials:   None yet  Subjective: Awake pleasant in  nad no distress no sob no fever no chills  Objective: Vitals:   07/30/17 2022 07/31/17 0500 07/31/17 0522 07/31/17 0935  BP: 103/69  103/74 100/64  Pulse: 94 91 94 93  Resp: 15 20 18    Temp: 98.3 F (36.8 C)  97.9 F (36.6 C)   TempSrc: Oral  Oral   SpO2: 96%  92%   Weight:  (!) 148.5 kg (327 lb 4.8 oz)    Height:        Intake/Output Summary (Last 24 hours) at 07/31/2017 1113 Last data filed at 07/30/2017 1836 Gross per 24 hour  Intake 551 ml  Output -  Net 551 ml   Filed Weights   07/29/17 0214 07/30/17 0455 07/31/17 0500  Weight: (!) 147.8 kg (325 lb 14.4 oz) (!) 148.2 kg (326 lb 12.8 oz) (!) 148.5 kg (327 lb 4.8 oz)    Examination: eomki ncat-thick neck mallmaptaai 3 No jvd fisutla L UE cta b no adde dsoudn abd obese nt nd No le edema  Data Reviewed: I have personally reviewed following labs and imaging studies  CBC: Recent Labs  Lab 07/28/17 1830  WBC 9.7  HGB 13.2  HCT 41.7  MCV 81.6  PLT 330   Basic Metabolic Panel: Recent Labs  Lab 07/28/17 1830 07/30/17 0436 07/31/17 0301  NA 141 138 140  K 4.2 5.1 5.5*  CL 100* 100* 100*  CO2 25 22 22   GLUCOSE 101* 82 67  BUN 45* 63* 78*  CREATININE 10.46* 12.54* 14.77*  CALCIUM 9.2 9.2 9.1   GFR: Estimated Creatinine Clearance: 8.8 mL/min (A) (by C-G formula based on SCr of 14.77  mg/dL (H)). Liver Function Tests: No results for input(s): AST, ALT, ALKPHOS, BILITOT, PROT, ALBUMIN in the last 168 hours. No results for input(s): LIPASE, AMYLASE in the last 168 hours. No results for input(s): AMMONIA in the last 168 hours. Coagulation Profile: No results for input(s): INR, PROTIME in the last 168 hours. Cardiac Enzymes:  Radiology Studies: Reviewed images personally in health database   Scheduled Meds: . apixaban  5 mg Oral BID  . aspirin EC  81 mg Oral Daily  . calcium acetate  1,334 mg Oral TID WC  . cinacalcet  90 mg Oral Q breakfast  . ivabradine  5 mg Oral BID WC  . lanthanum  2,000 mg Oral  TID WC  . metoprolol tartrate  12.5 mg Oral BID  . midodrine  5 mg Oral Q T,Th,Sa-HD  . multivitamin  1 tablet Oral QHS  . sodium chloride flush  3 mL Intravenous Q12H   Continuous Infusions: . sodium chloride    . sodium chloride 20 mL/hr at 07/31/17 0947     LOS: 2 days    Time spent: [place], MD [facility]  If 7PM-7AM, please contact night-coverage [email or web address] Password [email or web address] 07/31/2017, 11:13 AM

## 2017-07-31 NOTE — Anesthesia Procedure Notes (Signed)
Procedure Name: Intubation Date/Time: 07/31/2017 12:34 PM Performed by: Mariea Clonts, CRNA Pre-anesthesia Checklist: Patient identified, Emergency Drugs available, Suction available and Patient being monitored Patient Re-evaluated:Patient Re-evaluated prior to induction Oxygen Delivery Method: Circle System Utilized Preoxygenation: Pre-oxygenation with 100% oxygen Induction Type: IV induction Ventilation: Mask ventilation without difficulty Laryngoscope Size: Miller and 3 Grade View: Grade I Tube type: Oral Tube size: 7.5 mm Number of attempts: 1 Airway Equipment and Method: Stylet and Oral airway Placement Confirmation: ETT inserted through vocal cords under direct vision,  positive ETCO2 and breath sounds checked- equal and bilateral Tube secured with: Tape Dental Injury: Teeth and Oropharynx as per pre-operative assessment

## 2017-07-31 NOTE — Progress Notes (Signed)
Dialysis treatment completed.  3000 mL ultrafiltrated and net fluid removal 2500 mL.    Patient status unchanged. Lung sounds diminished to ausculation in all fields. Generalized edema. Cardiac: NSR.  Disconnected lines and removed needles.  Pressure held for 10 minutes and band aid/gauze dressing applied.  Report given to bedside RN, Sharrie Rothman.

## 2017-07-31 NOTE — Progress Notes (Signed)
  [facility] Progress Note   Assessment/ Plan:   1  ESRD - on HD TTS, missed HD Satruday.  No vol excess on exam or CXR x 2, pt is at dry wt.  Plan for HD today and tomorrow to get back on track. 2  Cough/ DOE - w/u in progress--> ? 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  TEE today  4  Hypotension - takes midodrine pre HD tiw, 5  MBD ckd - cont binders 6  Anemia ckd - Hb 13, no need for esa    Subjective:    For TEE today.     Objective:   BP 109/68   Pulse 88   Temp 98 F (36.7 C) (Oral)   Resp 16   Ht 6' (1.829 m)   Wt (!) 148.5 kg (327 lb 4.8 oz)   SpO2 98%   BMI 44.39 kg/m   Physical Exam: Gen: sitting in chair, NAD CVS: RRR, systolic and diastolic murmur present Resp: clear bilaterally Abd: obese Ext: no LE edema  Labs: BMET Recent Labs  Lab 07/28/17 1830 07/30/17 0436 07/31/17 0301  NA 141 138 140  K 4.2 5.1 5.5*  CL 100* 100* 100*  CO2 25 22 22   GLUCOSE 101* 82 67  BUN 45* 63* 78*  CREATININE 10.46* 12.54* 14.77*  CALCIUM 9.2 9.2 9.1   CBC Recent Labs  Lab 07/28/17 1830  WBC 9.7  HGB 13.2  HCT 41.7  MCV 81.6  PLT 283    @IMGRELPRIORS @ Medications:    . [MAR Hold] apixaban  5 mg Oral BID  . [MAR Hold] aspirin EC  81 mg Oral Daily  . [MAR Hold] calcium acetate  1,334 mg Oral TID WC  . [MAR Hold] cinacalcet  90 mg Oral Q breakfast  . [MAR Hold] ivabradine  5 mg Oral BID WC  . [MAR Hold] lanthanum  2,000 mg Oral TID WC  . [MAR Hold] metoprolol tartrate  12.5 mg Oral BID  . [MAR Hold] midodrine  5 mg Oral Q T,Th,Sa-HD  . [MAR Hold] multivitamin  1 tablet Oral QHS  . [MAR Hold] sodium chloride flush  3 mL Intravenous Q12H     Madelon Lips, MD [facility] pgr [phone] 07/31/2017, 11:45 AM

## 2017-07-31 NOTE — Interval H&P Note (Signed)
History and Physical Interval Note:  07/31/2017 12:09 PM  [name] [name]  has presented today for surgery, with the diagnosis of aortic valve mass  The various methods of treatment have been discussed with the patient and family. After consideration of risks, benefits and other options for treatment, the patient has consented to  Procedure(s): TRANSESOPHAGEAL ECHOCARDIOGRAM (TEE) (N/A) as a surgical intervention .  The patient's history has been reviewed, patient examined, no change in status, stable for surgery.  I have reviewed the patient's chart and labs.  Questions were answered to the patient's satisfaction.     Jenkins Rouge

## 2017-07-31 NOTE — Consult Note (Addendum)
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   He has never been treated for rheumatic fever.   Followed by Dr Bronson Ing and was last seen 07/20/2017. He evaluated for sinus tachycardia and orthopnea.  In the past he had been followed by Dr Ron Parker. He was set up for ECHO. ECHO completed on 4/12 with concern for vegetation on LVOT.   Admitted to [facility] for further evaluation of vegetation noted on LVOT. Cardiology consulted with for TEE. Underwent TEE today and required intubation. Extubated post procedure.   Denies SOB.   TEE 07/31/2017 Left ventricle: Hypertrophy was noted. Systolic function was   severely reduced. The estimated ejection fraction was in the   range of 25% to 30%. Diffuse hypokinesis. - Aortic valve: Valve is trileaflet and severely thickened. Did not   think there was a mass or vegetation on valve Nodular   calcification of leaflets   wtith bulky calcium. There was significatn shadowing of the mid   esohageal views obscuring the LVOT but had good images   from trans gastic imaging There was mild stenosis. There was   severe regurgitation. - Mitral valve: Moderately to severely calcified annulus.   Moderately thickened, moderately calcified leaflets . There was   moderate regurgitation. - Left atrium: The atrium was dilated. No evidence of thrombus in   the atrial cavity or appendage. - Right ventricle: The cavity size was mildly dilated. - Right atrium: No evidence of thrombus in the atrial cavity or   appendage. - Atrial septum: No defect or  patent foramen ovale was identified. - Impressions: 3D rendering of the Aortic and mitral valves was   performed.  Echo 07/28/2017  - EF 20-25% - Large, globular and calcified density noted in LVOT just proximal   to the aortic valve. Parasternal views suggest potential   attachment to the LVOT/annulus, however short axis views are more   consistent with attachment to the left coronary cusp. Due to   large size this appears to obstruct flow through the LVOT which   could be impacting cardiac output and therefore resulting   tachycardia. High suspicion for valvular vegetation, although   could be more chronic finding due to calcification. TEE is   indicated to evaluate further. Ordering provider out of the   office. Patient being contacted by nursing and directed to ER at   [facility] in anticipation of admission for further evaluation of   both cardiomyopathy and potential endocarditis.  2012 EF 60%  Review of Systems: [y] = yes, [ ]  = no   General: Weight gain [ ] ; Weight loss [ ] ; Anorexia [ ] ; Fatigue [ Y]; Fever [ ] ; Chills [ ] ; Weakness [ ]   Cardiac: Chest pain/pressure [ ] ; Resting SOB [ ] ; Exertional SOB [ ] ; Orthopnea [ ] ; Pedal Edema [ ] ; Palpitations [ ] ; Syncope [ ] ; Presyncope [ ] ; Paroxysmal nocturnal dyspnea[ ]   Pulmonary: Cough [ ] ; Wheezing[ ] ; Hemoptysis[ ] ; Sputum [ ] ; Snoring [ ]   GI: Vomiting[ ] ; Dysphagia[ ] ; Melena[ ] ;  Hematochezia [ ] ; Heartburn[ ] ; Abdominal pain [ ] ; Constipation [ ] ; Diarrhea [ ] ; BRBPR [ ]   GU: Hematuria[ ] ; Dysuria [ ] ; Nocturia[ ]   Vascular: Pain in legs with walking [ ] ; Pain in feet with lying flat [ ] ; Non-healing sores [ ] ; Stroke [ ] ; TIA [ ] ; Slurred speech [ ] ;  Neuro: Headaches[ ] ; Vertigo[ ] ; Seizures[ ] ; Paresthesias[ ] ;Blurred vision [ ] ; Diplopia [ ] ; Vision changes [ ]   Ortho/Skin: Arthritis [ ] ; Joint pain [ ] ; Muscle pain [ ] ; Joint swelling [ ] ; Back Pain [ ] ; Rash [ ]   Psych: Depression[ ] ; Anxiety[ ]   Heme: Bleeding  problems [ ] ; Clotting disorders [ ] ; Anemia [ ]   Endocrine: Diabetes [ ] ; Thyroid dysfunction[ ]   Home Medications Prior to Admission medications   Medication Sig Start Date End Date Taking? Authorizing Provider  apixaban (ELIQUIS) 5 MG TABS tablet Take 1 tablet (5 mg total) 2 (two) times daily by mouth. 02/23/17  Yes Algernon Huxley, MD  aspirin EC 81 MG tablet Take 81 mg by mouth every morning.   Yes [provider]  B Complex-C-Folic Acid (NEPHRO-VITE PO) Take 1 tablet by mouth at bedtime.    Yes [provider]  benzonatate (TESSALON) 100 MG capsule 2 po tid for cough Patient taking differently: Take 200 mg by mouth 2 (two) times daily as needed. 2 po tid for cough 07/14/17  Yes Lily Kocher, PA-C  calcium acetate (PHOSLO) 667 MG capsule Take 2,001 mg by mouth See admin instructions. 1334 mg 3 times a day with meals and 667 mg with snacks   Yes [provider]  cinacalcet (SENSIPAR) 90 MG tablet Take 90 mg by mouth daily.   Yes [provider]  FOSRENOL 500 MG chewable tablet Chew 2,000 mg by mouth See admin instructions. Take 2000 mg with meals and 1000 mg with snacks 05/28/13  Yes [provider]  midodrine (PROAMATINE) 5 MG tablet Take 1 tablet by mouth See admin instructions. Pt may take before dialysis treatment, Tues, Thurs and Sat 02/09/16  Yes [provider]  nitroGLYCERIN (NITROSTAT) 0.4 MG SL tablet Place 1 tablet (0.4 mg total) under the tongue every 5 (five) minutes as needed. For chest 05/30/13  Yes Sharmon Revere    Past Medical History: Past Medical History:  Diagnosis Date  . Aneurysm ([place])     Right arm fistula 3 aneurysms 2011,   plans to have a new procedure  . Angina    occasional, last 6 mo ago  . Chest discomfort     2008  . CHF (congestive heart failure) ([place])   . Dialysis patient [facility])    M-W-F @ [place]  . Dysrhythmia   . Ejection fraction     65%, echo, 2008   /   EF 55-60%, echo, October 28, 2010  .  ESRD (end stage renal disease) ([place])    on hemodialysis T_T_S  . Hypertension   . Leg pain   . Orthostatic hypotension     2008  . Overweight(278.02)   . Sinus tachycardia     2008, TSH normal ... happens frequently with heart rate at 120-130  . Syncope     positional after dialysis... 2008    Past Surgical History: Past Surgical History:  Procedure Laterality Date  . A/V FISTULAGRAM Left 06/30/2016   Procedure: A/V Fistulagram;  Surgeon: Algernon Huxley, MD;  Location: [place] [facility];  Service: Cardiovascular;  Laterality: Left;  .  ARTERIOVENOUS GRAFT PLACEMENT    . AV FISTULA PLACEMENT    . AV FISTULA PLACEMENT Left 11/18/2015   Procedure: ARTERIOVENOUS (AV) FISTULA CREATION ( RADIOCEPHALIC );  Surgeon: Algernon Huxley, MD;  Location: [facility] ORS;  Service: Vascular;  Laterality: Left;  . AV FISTULA PLACEMENT Left 03/23/2016   Procedure: ARTERIOVENOUS (AV) FISTULA CREATION ( REVISION );  Surgeon: Algernon Huxley, MD;  Location: [facility] ORS;  Service: Vascular;  Laterality: Left;  . [place] REMOVAL Left 03/23/2016   Procedure: REMOVAL OF ARTERIOVENOUS GORETEX GRAFT ([place]);  Surgeon: Algernon Huxley, MD;  Location: [facility] ORS;  Service: Vascular;  Laterality: Left;  . DIALYSIS/PERMA CATHETER REMOVAL N/A 08/01/2016   Procedure: Dialysis/Perma Catheter Removal;  Surgeon: Algernon Huxley, MD;  Location: [place] [facility];  Service: Cardiovascular;  Laterality: N/A;  . HERNIA REPAIR    . INSERTION OF DIALYSIS CATHETER  03/01/2011   Procedure: INSERTION OF DIALYSIS CATHETER;  Surgeon: Elam Dutch, MD;  Location: [place];  Service: Vascular;  Laterality: Left;  Exchange of Dialysis Catheter to 27cm 15Fr. Arrow Catheter  . PERIPHERAL VASCULAR CATHETERIZATION N/A 09/01/2014   Procedure: A/V Shuntogram/Fistulagram;  Surgeon: Algernon Huxley, MD;  Location: [place] [facility];  Service: Cardiovascular;  Laterality: N/A;  . PERIPHERAL VASCULAR CATHETERIZATION Right 09/01/2014   Procedure: Thrombectomy;  Surgeon: Algernon Huxley, MD;  Location: [place] [facility];  Service: Cardiovascular;  Laterality: Right;  . PERIPHERAL VASCULAR CATHETERIZATION Right 09/01/2014   Procedure: A/V Shunt Intervention;  Surgeon: Algernon Huxley, MD;  Location: [place] [facility];  Service: Cardiovascular;  Laterality: Right;  . PERIPHERAL VASCULAR CATHETERIZATION N/A 09/17/2014   Procedure: A/V Shuntogram/Fistulagram;  Surgeon: Algernon Huxley, MD;  Location: [place] [facility];  Service: Cardiovascular;  Laterality: N/A;  . PERIPHERAL VASCULAR CATHETERIZATION Right 10/17/2014   Procedure: Thrombectomy;  Surgeon: Katha Cabal, MD;  Location: [place] [facility];  Service: Cardiovascular;  Laterality: Right;  . PERIPHERAL VASCULAR CATHETERIZATION N/A 10/17/2014   Procedure: A/V Shuntogram/Fistulagram;  Surgeon: Katha Cabal, MD;  Location: [place] [facility];  Service: Cardiovascular;  Laterality: N/A;  . PERIPHERAL VASCULAR CATHETERIZATION N/A 10/17/2014   Procedure: A/V Shunt Intervention;  Surgeon: Katha Cabal, MD;  Location: [place] [facility];  Service: Cardiovascular;  Laterality: N/A;  . PERIPHERAL VASCULAR CATHETERIZATION Right 11/04/2014   Procedure: Thrombectomy;  Surgeon: Katha Cabal, MD;  Location: [place] [facility];  Service: Cardiovascular;  Laterality: Right;  . PERIPHERAL VASCULAR CATHETERIZATION Left 11/04/2014   Procedure: Visceral Venography;  Surgeon: Katha Cabal, MD;  Location: [place] [facility];  Service: Cardiovascular;  Laterality: Left;  . PERIPHERAL VASCULAR CATHETERIZATION Right 11/27/2014   Procedure: A/V Shuntogram/Fistulagram;  Surgeon: Algernon Huxley, MD;  Location: [place] [facility];  Service: Cardiovascular;  Laterality: Right;  . PERIPHERAL VASCULAR CATHETERIZATION Right 11/27/2014   Procedure: A/V Shunt Intervention;  Surgeon: Algernon Huxley, MD;  Location: [place] [facility];  Service: Cardiovascular;  Laterality: Right;  . PERIPHERAL VASCULAR CATHETERIZATION Right 12/31/2014    Procedure: Thrombectomy;  Surgeon: Algernon Huxley, MD;  Location: [place] [facility];  Service: Cardiovascular;  Laterality: Right;  . PERIPHERAL VASCULAR CATHETERIZATION Right 01/12/2015   Procedure: A/V Shuntogram/Fistulagram;  Surgeon: Algernon Huxley, MD;  Location: [place] [facility];  Service: Cardiovascular;  Laterality: Right;  . PERIPHERAL VASCULAR CATHETERIZATION N/A 01/12/2015   Procedure: A/V Shunt Intervention;  Surgeon: Algernon Huxley, MD;  Location: [facility]  INVASIVE CV LAB;  Service: Cardiovascular;  Laterality: N/A;  . PERIPHERAL VASCULAR CATHETERIZATION Right 01/28/2015   Procedure: Thrombectomy;  Surgeon: Algernon Huxley, MD;  Location: [place] [facility] LAB;  Service: Cardiovascular;  Laterality: Right;  . PERIPHERAL VASCULAR CATHETERIZATION N/A 06/08/2015   Procedure: A/V Shuntogram/Fistulagram;  Surgeon: Algernon Huxley, MD;  Location: [place] [facility];  Service: Cardiovascular;  Laterality: N/A;  . PERIPHERAL VASCULAR CATHETERIZATION N/A 06/08/2015   Procedure: A/V Shunt Intervention;  Surgeon: Algernon Huxley, MD;  Location: [place] [facility];  Service: Cardiovascular;  Laterality: N/A;  . PERIPHERAL VASCULAR CATHETERIZATION Right 07/06/2015   Procedure: A/V Shuntogram/Fistulagram;  Surgeon: Algernon Huxley, MD;  Location: [place] [facility];  Service: Cardiovascular;  Laterality: Right;  . PERIPHERAL VASCULAR CATHETERIZATION N/A 07/06/2015   Procedure: A/V Shunt Intervention;  Surgeon: Algernon Huxley, MD;  Location: [place] [facility];  Service: Cardiovascular;  Laterality: N/A;  . PERIPHERAL VASCULAR CATHETERIZATION N/A 08/04/2015   Procedure: graft declot;  Surgeon: Katha Cabal, MD;  Location: [place] [facility];  Service: Cardiovascular;  Laterality: N/A;  . PERIPHERAL VASCULAR CATHETERIZATION Right 08/25/2015   Procedure: A/V Shuntogram/Fistulagram;  Surgeon: Katha Cabal, MD;  Location: [place] [facility];  Service: Cardiovascular;  Laterality: Right;  . PERIPHERAL VASCULAR  CATHETERIZATION N/A 11/04/2015   Procedure: Dialysis/Perma Catheter Insertion;  Surgeon: Algernon Huxley, MD;  Location: [place] [facility];  Service: Cardiovascular;  Laterality: N/A;  . PERIPHERAL VASCULAR CATHETERIZATION Left 12/14/2015   Procedure: A/V Shuntogram/Fistulagram;  Surgeon: Algernon Huxley, MD;  Location: [place] [facility];  Service: Cardiovascular;  Laterality: Left;  . PERIPHERAL VASCULAR CATHETERIZATION N/A 12/14/2015   Procedure: A/V Shunt Intervention;  Surgeon: Algernon Huxley, MD;  Location: [place] [facility];  Service: Cardiovascular;  Laterality: N/A;  . PERIPHERAL VASCULAR CATHETERIZATION N/A 12/17/2015   Procedure: Dialysis/Perma Catheter Insertion;  Surgeon: Algernon Huxley, MD;  Location: [place] [facility];  Service: Cardiovascular;  Laterality: N/A;  . PERIPHERAL VASCULAR CATHETERIZATION Left 12/22/2015   Procedure: Dialysis/Perma Catheter Insertion;  Surgeon: Katha Cabal, MD;  Location: [place] [facility];  Service: Cardiovascular;  Laterality: Left;  . PERIPHERAL VASCULAR CATHETERIZATION Left 02/29/2016   Procedure: A/V Shuntogram/Fistulagram;  Surgeon: Algernon Huxley, MD;  Location: [place] [facility];  Service: Cardiovascular;  Laterality: Left;  . PERIPHERAL VASCULAR CATHETERIZATION N/A 02/29/2016   Procedure: A/V Shunt Intervention;  Surgeon: Algernon Huxley, MD;  Location: [place] [facility];  Service: Cardiovascular;  Laterality: N/A;  . right arm graft     for dyalisis  . THROMBECTOMY    . THROMBECTOMY W/ EMBOLECTOMY  03/01/2011   Procedure: THROMBECTOMY ARTERIOVENOUS GORE-TEX GRAFT;  Surgeon: Elam Dutch, MD;  Location: [place];  Service: Vascular;  Laterality: Right;  Attempted Thrombectomy of Old  Right Upper Arm Arteriovenous gortex Graft. Insertion of new Arteriovenous Graft using 48mm x 50cm Gortex Stretch graft.   . THROMBECTOMY W/ EMBOLECTOMY  07/11/2011   Procedure: THROMBECTOMY ARTERIOVENOUS GORE-TEX GRAFT;  Surgeon: Rosetta Posner, MD;  Location: [place];   Service: Vascular;  Laterality: Right;  . UMBILICAL HERNIA REPAIR    . VENOGRAM N/A 08/23/2011   Procedure: VENOGRAM;  Surgeon: [name], MD;  Location: [facility] CATH LAB;  Service: Cardiovascular;  Laterality: N/A;    Family History: Family History  Problem Relation Age of Onset  . Hypertension Mother   . Heart disease Mother   . Hypertension Father   .  Heart disease Father     Social History: Social History   Socioeconomic History  . Marital status: Married    Spouse name: Not on file  . Number of children: Not on file  . Years of education: Not on file  . Highest education level: Not on file  Occupational History  . Not on file  Social Needs  . Financial resource strain: Not on file  . Food insecurity:    Worry: Not on file    Inability: Not on file  . Transportation needs:    Medical: Not on file    Non-medical: Not on file  Tobacco Use  . Smoking status: Former Smoker    Types: Cigarettes    Last attempt to quit: 04/18/1994    Years since quitting: 23.3  . Smokeless tobacco: Current User    Types: Snuff  . Tobacco comment: dips 1/2 snuff per day times 25 years  Substance and Sexual Activity  . Alcohol use: No  . Drug use: No  . Sexual activity: Not on file  Lifestyle  . Physical activity:    Days per week: Not on file    Minutes per session: Not on file  . Stress: Not on file  Relationships  . Social connections:    Talks on phone: Not on file    Gets together: Not on file    Attends religious service: Not on file    Active member of club or organization: Not on file    Attends meetings of clubs or organizations: Not on file    Relationship status: Not on file  Other Topics Concern  . Not on file  Social History Narrative   Single    Allergies:  No Known Allergies  Objective:    Vital Signs:   Temp:  [97.9 F (36.6 C)-98.3 F (36.8 C)] 98 F (36.7 C) (04/15 1128) Pulse Rate:  [88-94] 88 (04/15 1128) Resp:  [15-20] 16 (04/15 1128) BP:  (100-109)/(64-74) 109/68 (04/15 1128) SpO2:  [92 %-98 %] 98 % (04/15 1128) Weight:  [327 lb 4.8 oz (148.5 kg)] 327 lb 4.8 oz (148.5 kg) (04/15 0500) Last BM Date: 07/30/17  Weight change: Filed Weights   07/29/17 0214 07/30/17 0455 07/31/17 0500  Weight: (!) 325 lb 14.4 oz (147.8 kg) (!) 326 lb 12.8 oz (148.2 kg) (!) 327 lb 4.8 oz (148.5 kg)    Intake/Output:   Intake/Output Summary (Last 24 hours) at 07/31/2017 1139 Last data filed at 07/30/2017 1836 Gross per 24 hour  Intake 551 ml  Output -  Net 551 ml      Physical Exam    General:  Well appearing. No resp difficulty HEENT: normal Neck: supple. JVP to jaw  . Carotids 2+ bilat; no bruits. No lymphadenopathy or thyromegaly appreciated. Cor: PMI nondisplaced. Tachy Regular rate & rhythm. No rubs, gallops.  Lungs: clear Abdomen: soft, nontender, nondistended. No hepatosplenomegaly. No bruits or masses. Good bowel sounds. Extremities: no cyanosis, clubbing, rash, edema Neuro: alert & orientedx3, cranial nerves grossly intact. moves all 4 extremities w/o difficulty. Affect pleasant   Telemetry   NSR 80s   EKG      Labs   Basic Metabolic Panel: Recent Labs  Lab 07/28/17 1830 07/30/17 0436 07/31/17 0301  NA 141 138 140  K 4.2 5.1 5.5*  CL 100* 100* 100*  CO2 25 22 22   GLUCOSE 101* 82 67  BUN 45* 63* 78*  CREATININE 10.46* 12.54* 14.77*  CALCIUM 9.2 9.2 9.1  Liver Function Tests: No results for input(s): AST, ALT, ALKPHOS, BILITOT, PROT, ALBUMIN in the last 168 hours. No results for input(s): LIPASE, AMYLASE in the last 168 hours. No results for input(s): AMMONIA in the last 168 hours.  CBC: Recent Labs  Lab 07/28/17 1830  WBC 9.7  HGB 13.2  HCT 41.7  MCV 81.6  PLT 283    Cardiac Enzymes: No results for input(s): CKTOTAL, CKMB, CKMBINDEX, TROPONINI in the last 168 hours.  BNP: BNP (last 3 results) Recent Labs    07/28/17 1831  BNP 1,098.0*    ProBNP (last 3 results) No results for  input(s): PROBNP in the last 8760 hours.   CBG: No results for input(s): GLUCAP in the last 168 hours.  Coagulation Studies: No results for input(s): LABPROT, INR in the last 72 hours.   Imaging   Dg Chest Port 1 View  Result Date: 07/30/2017 CLINICAL DATA:  Shortness of breath, end-stage renal disease on dialysis, CHF, hypertension EXAM: PORTABLE CHEST 1 VIEW COMPARISON:  Portable exam 1732 hours compared to 07/28/2017 FINDINGS: Borderline enlargement of cardiac silhouette. Mediastinal contours and pulmonary vascularity normal. Minimal LEFT basilar atelectasis. Lungs otherwise clear. No pleural effusion or pneumothorax. Bones unremarkable. IMPRESSION: No acute abnormalities. Electronically Signed   By: Lavonia Dana M.D.   On: 07/30/2017 18:16      Medications:     Current Medications: . [MAR Hold] apixaban  5 mg Oral BID  . [MAR Hold] aspirin EC  81 mg Oral Daily  . [MAR Hold] calcium acetate  1,334 mg Oral TID WC  . [MAR Hold] cinacalcet  90 mg Oral Q breakfast  . [MAR Hold] ivabradine  5 mg Oral BID WC  . [MAR Hold] lanthanum  2,000 mg Oral TID WC  . [MAR Hold] metoprolol tartrate  12.5 mg Oral BID  . [MAR Hold] midodrine  5 mg Oral Q T,Th,Sa-HD  . [MAR Hold] multivitamin  1 tablet Oral QHS  . [MAR Hold] sodium chloride flush  3 mL Intravenous Q12H     Infusions: . [MAR Hold] sodium chloride    . sodium chloride 20 mL/hr at 07/31/17 3151       Patient Profile   Mr Teal is a 52 year old with history of ESRD TTS [place], smoker, sinus tach, AV graft thrombosis, and morbid obesity.   Newly reduced EF and vegetation on LVOT.   Assessment/Plan   1. Acute Sysotolic Heart Failure ECHO 10/28/2010 EF was normal and had normal aortic valve. [name] ECHO 07/28/2017 EF 20-25% with severe AR. Has never had rheumatic fever. Plan for RHC/LHC 08/02/17 to further evaluated.  Volume status per iHD. Volume status elevated.   No room for hydralazine/imdur with ongoing hypotension. On  midodrine.  - Stop Toprol xl 12.5 mg daily.  - No Ace/arb/spiro with ESRD 2.  Concern for Endocarditis on ECHO  Blood CX obtained. NGTD No vegetation on TEE 3. Severe AR Noted on TEE Will need CT surgery consult. .  4. Sinus Tach  On ivabradine 5 mg bid 5.  ESRD TTS. On eliquis due to av thrombosis.  6. Suspected OSA Has outpatient sleep study 08/08/2017  7. Obesity Body mass index is 44.39 kg/m.  CT surgery consulted.   Medication concerns reviewed with patient and pharmacy team. Barriers identified: None at this time.  Length of Stay: 2  Darrick Grinder, NP  07/31/2017, 11:39 AM  Advanced Heart Failure Team Pager [phone] (M-F; 7a - 4p)  Please contact [facility] for night-coverage  after hours (4p -7a ) and weekends on [email or web address]  Patient seen with NP, agree with the above note.    Patient has a long history of ESRD from HTN.  He also has a history of sinus tachycardia with HR chronically in the 100s-110s.  He is on midodrine on HD days due to low BP at HD.  Sinus tachy was evaluated in 2012, at that time echo showed normal EF and no aortic insufficiency.   He was sent to Dr. Jacinta Shoe for evaluation of sinus tachy and dyspnea.  Echo was done, showing EF 20-25% with concern for vegetation on the aortic valve.  He was admitted because of ?vegetation.  Blood cultures negative.  TEE done today and I reviewed.  EF 25% range with a calcified, rheumatic-appearing aortic valve.  No vegetation but suspect severe AI. There was a calcified mitral valve with moderate MR.    Patient is volume overloaded on exam.  He has been getting daily HD since arrival for fluid removal.    He was started on low dose Toprol XL and Corlanor, HR now in 80s NSR.   1. 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  No vegetation seen.  Blood cultures negative.   Severe AI could certainly potentiate CHF. This was not see on 2012 echo.  The LV is not significantly dilated, so not sure AI is the only explanation for his cardiomyopathy.  - I will involve cardiac surgery as I suspect his aortic valve will need replacement.  - He will need RHC/LHC prior to any valve intervention.  He is going to have HD today and tomorrow to remove fluid, will plan cath Wednesday morning.  Risks/benefits explained to patient and he agrees to proceed.  Hold apixaban (on to keep his dialysis access open) after tonight's dose.  2. Acute on ?chronic systolic CHF:  Echo with EF 20-25%.  As LV is not markedly dilated, I am not sure that aortic insufficiency can be invoked as the only cause for cardiomyopathy.  He has chronic sinus tachycardia x years but HR has been in 100s-110s, do not think this would have been likely to cause a tachy-mediated CMP.  Need to rule out coronary disease given h/o HTN and ESRD.  He remains volume overloaded on exam.  - As above, plan RHC/LHC on Wednesday.  - Agree with Corlanor 5 mg bid and Toprol XL 12.5 mg daily.  He feels better with lower HR.  - No BP room to add other HF meds, he actually requires midodrine with HD.  3. ESRD: Still volume overloaded, will have HD today and tomorrow prior to cath Wednesday.  4. Suspected OSA: Needs sleep study.   Loralie Champagne 07/31/2017 3:03 PM

## 2017-07-31 NOTE — Anesthesia Preprocedure Evaluation (Addendum)
Anesthesia Evaluation  Patient identified by MRN, date of birth, ID band Patient awake    Reviewed: Allergy & Precautions, H&P , Patient's Chart, lab work & pertinent test results, reviewed documented beta blocker date and time   Airway Mallampati: II  TM Distance: >3 FB Neck ROM: full    Dental no notable dental hx. (+) Poor Dentition, Dental Advisory Given   Pulmonary former smoker,    Pulmonary exam normal breath sounds clear to auscultation       Cardiovascular hypertension,  Rhythm:regular Rate:Normal     Neuro/Psych    GI/Hepatic   Endo/Other  Morbid obesity  Renal/GU      Musculoskeletal   Abdominal   Peds  Hematology   Anesthesia Other Findings Easy DL; MAC 4 65% EF   Reproductive/Obstetrics                            Anesthesia Physical Anesthesia Plan  ASA: III  Anesthesia Plan: MAC   Post-op Pain Management:    Induction: Intravenous  PONV Risk Score and Plan:   Airway Management Planned: Mask and Natural Airway  Additional Equipment:   Intra-op Plan:   Post-operative Plan:   Informed Consent: I have reviewed the patients History and Physical, chart, labs and discussed the procedure including the risks, benefits and alternatives for the proposed anesthesia with the patient or authorized representative who has indicated his/her understanding and acceptance.   Dental Advisory Given  Plan Discussed with: CRNA and Surgeon  Anesthesia Plan Comments:         Anesthesia Quick Evaluation

## 2017-07-31 NOTE — Progress Notes (Signed)
  Echocardiogram Echocardiogram Transesophageal has been performed.  Lometa Riggin T Brittnie Lewey 07/31/2017, 1:12 PM

## 2017-07-31 NOTE — CV Procedure (Signed)
TEE: Patient had to be intubated to secure airway  AV is tri leaflet and severely calcified. ? Rheumatic There is no vegetation seen There is severe AR and mild AS MAC with moderate MR EF 25-30% diffuse hypokinesis No LAA thormbus Bi Atrial Enlargement  Mild RV enlargement    See full report in [employer]

## 2017-08-01 ENCOUNTER — Other Ambulatory Visit (HOSPITAL_COMMUNITY): Payer: Medicare Other

## 2017-08-01 ENCOUNTER — Other Ambulatory Visit: Payer: Self-pay | Admitting: *Deleted

## 2017-08-01 ENCOUNTER — Inpatient Hospital Stay (HOSPITAL_COMMUNITY): Payer: Medicare Other

## 2017-08-01 DIAGNOSIS — I351 Nonrheumatic aortic (valve) insufficiency: Secondary | ICD-10-CM

## 2017-08-01 DIAGNOSIS — I061 Rheumatic aortic insufficiency: Secondary | ICD-10-CM

## 2017-08-01 DIAGNOSIS — I340 Nonrheumatic mitral (valve) insufficiency: Secondary | ICD-10-CM

## 2017-08-01 LAB — COMPREHENSIVE METABOLIC PANEL
ALT: 9 U/L — ABNORMAL LOW (ref 17–63)
ANION GAP: 14 (ref 5–15)
AST: 14 U/L — ABNORMAL LOW (ref 15–41)
Albumin: 2.9 g/dL — ABNORMAL LOW (ref 3.5–5.0)
Alkaline Phosphatase: 111 U/L (ref 38–126)
BUN: 46 mg/dL — ABNORMAL HIGH (ref 6–20)
CHLORIDE: 97 mmol/L — AB (ref 101–111)
CO2: 25 mmol/L (ref 22–32)
Calcium: 8.5 mg/dL — ABNORMAL LOW (ref 8.9–10.3)
Creatinine, Ser: 10.55 mg/dL — ABNORMAL HIGH (ref 0.61–1.24)
GFR, EST AFRICAN AMERICAN: 6 mL/min — AB (ref >60)
GFR, EST NON AFRICAN AMERICAN: 5 mL/min — AB (ref >60)
Glucose, Bld: 89 mg/dL (ref 65–99)
POTASSIUM: 3.8 mmol/L (ref 3.5–5.1)
Sodium: 136 mmol/L (ref 135–145)
Total Bilirubin: 0.9 mg/dL (ref 0.3–1.2)
Total Protein: 7.1 g/dL (ref 6.5–8.1)

## 2017-08-01 LAB — PROTIME-INR
INR: 1.38
Prothrombin Time: 16.9 seconds — ABNORMAL HIGH (ref 11.4–15.2)

## 2017-08-01 MED ORDER — METOPROLOL TARTRATE 5 MG/5ML IV SOLN
2.5000 mg | Freq: Once | INTRAVENOUS | Status: AC | PRN
  Administered 2017-08-01: 2.5 mg via INTRAVENOUS
  Filled 2017-08-01: qty 5

## 2017-08-01 MED ORDER — METOPROLOL TARTRATE 5 MG/5ML IV SOLN: 2.5000 mg | Freq: Once | INTRAVENOUS | Status: DC

## 2017-08-01 NOTE — Progress Notes (Signed)
Pt's heart rate unexpectedly increased to 160s - 170s for a few seconds three times before dropping back to the 80s. Several EKGs were done and showed no AFib. Pt was asypmtomatic and didn't feel any different. Current heart rate is 155 and BP is 107/67. Does have a history of tachycardia up to 120s - 130s. On call hospitalist, Baltazar Najjar, NP, was notified and ordered a one time dose of IV metoprolol 2.5 mg for sustaining heart rate of 130s. Will continue to monitor pt.  Lupita Dawn, RN

## 2017-08-01 NOTE — H&P (View-Only) (Signed)
Advanced Heart Failure Rounding Note  PCP-Cardiologist: Kate Sable, MD   Subjective:    Noted to shortly go into brief episodes of tachycardia overnight as high as 140 bpm. Broke with Iv lopressor.   Feeling OK today. Feels like his extra fluid is off. Denies CP  TEE 07/31/17 AV is tri leaflet and severely calcified. ? Rheumatic There is no vegetation seen There is severe AR and mild AS MAC with moderate [name] EF 25-30% diffuse hypokinesis No LAA thormbus Bi Atrial Enlargement  Mild RV enlargement   Objective:   Weight Range: (!) 323 lb 4.8 oz (146.6 kg) Body mass index is 43.85 kg/m.   Vital Signs:   Temp:  [97.3 F (36.3 C)-98.8 F (37.1 C)] 98 F (36.7 C) (04/16 0431) Pulse Rate:  [77-93] 92 (04/16 0431) Resp:  [16-33] 18 (04/16 0431) BP: (84-109)/(43-68) 90/59 (04/16 0431) SpO2:  [93 %-100 %] 95 % (04/16 0431) Weight:  [323 lb 4.8 oz (146.6 kg)-329 lb 9.4 oz (149.5 kg)] 323 lb 4.8 oz (146.6 kg) (04/16 0431) Last BM Date: 07/31/17  Weight change: Filed Weights   07/31/17 1755 07/31/17 2210 08/01/17 0431  Weight: (!) 329 lb 9.4 oz (149.5 kg) (!) 324 lb 1.2 oz (147 kg) (!) 323 lb 4.8 oz (146.6 kg)   Intake/Output:   Intake/Output Summary (Last 24 hours) at 08/01/2017 0820 Last data filed at 07/31/2017 2300 Gross per 24 hour  Intake 611 ml  Output 2500 ml  Net -1889 ml    Physical Exam    General:  Well appearing. No resp difficulty HEENT: Normal Neck: Supple. JVP difficult. Appears ~9-10 cm. Carotids 2+ bilat; no bruits. No lymphadenopathy or thyromegaly appreciated. Cor: PMI nondisplaced. Regular rate & rhythm. No rubs, gallops.  1/6 diastolic murmur SB. Lungs: Clear Abdomen: Soft, nontender, nondistended. No hepatosplenomegaly. No bruits or masses. Good bowel sounds. Extremities: No cyanosis, clubbing, rash, edema Neuro: Alert & orientedx3, cranial nerves grossly intact. moves all 4 extremities w/o difficulty. Affect pleasant  Telemetry   NSR  80-90s this am, personally. Overnight Tachy into 130-140s. Broke with IV lopressor.   EKG    No new tracings.    Labs    CBC Recent Labs    07/31/17 1851  WBC 8.9  HGB 12.7*  HCT 39.9  MCV 81.1  PLT 253   Basic Metabolic Panel Recent Labs    07/31/17 0301 08/01/17 0403  NA 140 136  K 5.5* 3.8  CL 100* 97*  CO2 22 25  GLUCOSE 67 89  BUN 78* 46*  CREATININE 14.77* 10.55*  CALCIUM 9.1 8.5*   Liver Function Tests Recent Labs    08/01/17 0403  AST 14*  ALT 9*  ALKPHOS 111  BILITOT 0.9  PROT 7.1  ALBUMIN 2.9*   No results for input(s): LIPASE, AMYLASE in the last 72 hours. Cardiac Enzymes No results for input(s): CKTOTAL, CKMB, CKMBINDEX, TROPONINI in the last 72 hours.  BNP: BNP (last 3 results) Recent Labs    07/28/17 1831  BNP 1,098.0*    ProBNP (last 3 results) No results for input(s): PROBNP in the last 8760 hours.   D-Dimer No results for input(s): DDIMER in the last 72 hours. Hemoglobin A1C No results for input(s): HGBA1C in the last 72 hours. Fasting Lipid Panel No results for input(s): CHOL, HDL, LDLCALC, TRIG, CHOLHDL, LDLDIRECT in the last 72 hours. Thyroid Function Tests No results for input(s): TSH, T4TOTAL, T3FREE, THYROIDAB in the last 72 hours.  Invalid input(s): FREET3  Other results:   Imaging     No results found.   Medications:     Scheduled Medications: . calcium acetate  1,334 mg Oral TID WC  . Chlorhexidine Gluconate Cloth  6 each Topical Q0600  . cinacalcet  90 mg Oral Q breakfast  . ivabradine  5 mg Oral BID WC  . lanthanum  2,000 mg Oral TID WC  . metoprolol succinate  12.5 mg Oral Daily  . midodrine  5 mg Oral Q T,Th,Sa-HD  . multivitamin  1 tablet Oral QHS  . mupirocin ointment  1 application Nasal BID  . sodium chloride flush  3 mL Intravenous Q12H     Infusions: . sodium chloride       PRN Medications:  sodium chloride, acetaminophen, ALPRAZolam, benzonatate, calcium acetate, lanthanum,  ondansetron (ZOFRAN) IV, sodium chloride flush, zolpidem    Patient Profile   [name] [name] is a 52 year old with history of ESRD TTS , smoker, sinus tach, AV graft thrombosis, and morbid obesity.   Newly reduced EF and vegetation on LVOT.   Assessment/Plan   1. Valvular heart disease - TEE 07/31/17 with AV that is tri leaflet and severely calcified. ? Rheumatic There is no vegetation seen There is severe AR and mild AS - Will plan [facility] tomorrow once optimize with HD.  - Cardiac Surgery has been consulted.  2. Acute Sysotolic Heart Failure ECHO 10/28/2010 EF was normal and had normal aortic valve. ECHO 07/28/2017 EF 20-25% with severe AR. Has never had rheumatic fever. Plan for RHC/LHC 08/02/17 to further evaluated.  Volume status per iHD. Volume status elevated.   No room for hydralazine/imdur with ongoing hypotension. On midodrine.  - Start Toprol xl 12.5 mg daily. (Received lopressor yesterday) - No Ace/arb/spiro with ESRD. He requires midodrine with HD.  - Plan R/LHC tomorrow at 1100 3.  Concern for Endocarditis on ECHO  Blood CX obtained. NGTD No vegetation on TEE 4. Sinus Tach  - On ivabradine 5 mg bid - Given IV lopressor overnight.  May need to increase Toprol 5.  ESRD - TTS schedule - Has been getting daily here to optimize.  - On eliquis due to av thrombosis.  6. Suspected OSA - Has outpatient sleep study 08/08/2017. May have to reschedule.  7. Obesity - Body mass index is 43.85 kg/m.   Medication concerns reviewed with patient and pharmacy team. Barriers identified: None at this time.   Length of Stay: 3  Annamaria Helling  08/01/2017, 8:20 AM  Advanced Heart Failure Team Pager [phone] (M-F; 7a - 4p)  Please contact [facility] for night-coverage after hours (4p -7a ) and weekends on [email or web address]   Patient seen with PA, agree with the above note.  He had HD yesterday and will get HD again today.  Volume status looks better.   1. 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  No vegetation seen.  Blood cultures negative.  Severe AI could certainly potentiate CHF. This was not see on 2012 echo.  The LV is not significantly dilated, so not sure AI is the only explanation for his cardiomyopathy.  - I will involve cardiac surgery as I suspect his aortic valve will need surgical replacement versus TAVR as he would be at at least moderate surgical risk.  - He will need RHC/LHC prior to any valve intervention.  He is going to  have HD again today for fluid removal, will plan cath Wednesday morning.  Risks/benefits explained to patient and he agrees to proceed.  Holding apixaban (on to keep his dialysis access open).  2. Acute on ?chronic systolic CHF:  Echo with EF 20-25%.  As LV is not markedly dilated, I am not sure that aortic insufficiency can be invoked as the only cause for cardiomyopathy.  He has chronic sinus tachycardia x years but HR has been in 100s-110s, do not think this would have been likely to cause a tachy-mediated CMP.  Need to rule out coronary disease given h/o HTN and ESRD.  Volume status improved on exam, breathing is better.   - As above, plan RHC/LHC on Wednesday.  - Agree with Corlanor 5 mg bid and Toprol XL 12.5 mg daily.  He feels better with lower HR.  - No BP room to add other HF meds, he actually requires midodrine with HD.  3. ESRD: Still volume overloaded, will have HD today prior to cath Wednesday.  4. Suspected OSA: Needs sleep study.  5. ?SVT: Report of SVT overnight, but I can only find a couple of 3 beat NSVT runs on telemetry.   Loralie Champagne 08/01/2017 12:53 PM

## 2017-08-01 NOTE — Progress Notes (Signed)
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 Recent ED visit [facility] ?virus Ad[facility] 4/13 shortness of breath-outpatient echocardiogram done at [facility] day prior to admission showed new systolic EF 67-59% globular calcified density LV outflow tract Never drug use  Admission troponin normal BNP 1098 sinus tach 108 blood cultures performed in ED Was sob with positional changes=platypneoa which was insidious but steadily worsening  Assessment & Plan:   New systolic heart failure, + 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  Prior smoker-congratulated  Super morbid obesity currently BMI 44-encouraged patient to follow-up with his nephrologist who had been guiding weight loss efforts in the past   DVT prophylaxis: lovenox  Code Status:    full   Family Communication:    D/w wife and brother bedside Disposition Plan: inpatient   Consultants:   nephro  Cardiology   Procedures:   TEE  HD  Antimicrobials:   None yet  Subjective:  Pleasant in nad no distress  no cp No fever no chills no n/v   Objective: Vitals:   07/31/17 2356 08/01/17 0431 08/01/17 0900 08/01/17 1352  BP: 107/67 (!) 90/59  (!) 86/54  Pulse: 88 92 96 89  Resp: (!) 26 18 19 20   Temp: 98.2 F (36.8 C) 98 F (36.7 C)  98.5 F (36.9 C)  TempSrc:  Oral  Oral  SpO2: 93% 95% 92% 95%  Weight:  (!) 146.6 kg (323 lb 4.8 oz)    Height:        Intake/Output Summary (Last 24 hours) at 08/01/2017 1407 Last data filed at 08/01/2017 1300 Gross per 24 hour  Intake 351 ml  Output 2500 ml  Net -2149 ml   Filed Weights   07/31/17 1755 07/31/17 2210 08/01/17 0431  Weight: (!) 149.5 kg (329 lb 9.4 oz) (!) 147 kg (324 lb 1.2 oz) (!) 146.6 kg (323 lb 4.8 oz)    Examination:  eomi ncat s1 s2 cannot appreciate M today abd obese nt nd no rebound No le edema Neuro intact power 5/5, sensory intact  Data Reviewed: I have personally reviewed following labs and imaging studies  CBC: Recent Labs  Lab 07/28/17 1830 07/31/17 1851  WBC 9.7 8.9  HGB 13.2 12.7*  HCT 41.7 39.9  MCV 81.6 81.1  PLT 283 163   Basic Metabolic Panel: Recent Labs  Lab 07/28/17 1830 07/30/17 0436 07/31/17 0301 08/01/17 0403  NA 141 138 140 136  K 4.2 5.1 5.5* 3.8  CL 100* 100* 100* 97*  CO2 25 22 22 25   GLUCOSE 101* 82 67 89  BUN 45* 63* 78* 46*  CREATININE 10.46* 12.54* 14.77* 10.55*  CALCIUM 9.2 9.2 9.1 8.5*   GFR: Estimated Creatinine Clearance: 12.2 mL/min (A) (by C-G formula based on SCr of 10.55 mg/dL (H)). Liver Function Tests: Recent Labs  Lab 08/01/17 0403  AST 14*  ALT 9*  ALKPHOS 111  BILITOT 0.9  PROT 7.1  ALBUMIN 2.9*   No results for input(s): LIPASE, AMYLASE in the last 168 hours. No results for input(s): AMMONIA in the last 168 hours. Coagulation Profile: Recent Labs  Lab 08/01/17 1232  INR 1.38   Cardiac Enzymes:  Radiology Studies: Reviewed images personally in health database   Scheduled Meds: . calcium acetate  1,334 mg Oral TID WC  . Chlorhexidine  Gluconate Cloth  6 each Topical Q0600  . cinacalcet  90 mg Oral Q breakfast  . ivabradine  5 mg Oral BID WC  . lanthanum  2,000 mg Oral TID WC  . metoprolol succinate  12.5 mg Oral Daily  . midodrine  5 mg Oral Q T,Th,Sa-HD  . multivitamin  1 tablet Oral QHS  . mupirocin ointment  1 application Nasal BID  . sodium chloride flush  3 mL Intravenous Q12H   Continuous Infusions: . sodium chloride       LOS: 3 days    Time spent: [place], MD [facility] ([phone]  If 7PM-7AM, please contact night-coverage [email or web address] Password [email or web address] 08/01/2017, 2:07 PM

## 2017-08-01 NOTE — Consult Note (Addendum)
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  The patient states that he has long-standing history of severe hypertension.  Approximately 20 years ago he began to develop symptoms of chronic diastolic congestive heart failure in the setting of progressive renal failure.  He ultimately became dialysis dependent approximately 17 years ago.  An echocardiogram performed 12 years ago revealed normal left ventricular systolic function with no mention of any significant valvular disease.  Recently the patient developed persistent dry nonproductive cough and orthopnea.  He was referred for cardiology consultation and evaluated by Dr. Bronson Ing 2 weeks ago.  Transthoracic echocardiogram performed April 29, 2017 revealed severe left ventricular systolic dysfunction with ejection fraction estimated 20-25%.  There was at least mild to moderate aortic insufficiency and question of possible vegetation beneath the aortic valve in the left ventricular outflow tract.  There was mild mitral regurgitation.  The patient was contacted via telephone and brought in for hospital admission for further workup and therapy.  Multiple sets of blood cultures were obtained and remain no growth to date.  Transesophageal echocardiogram performed  July 31, 2017 revealed severe aortic insufficiency with no clear evidence of vegetation.  There was moderate mitral regurgitation.  There was severe left ventricular systolic dysfunction with ejection fraction estimated 25-30%.  There was significant left ventricular hypertrophy.  Left atrium was dilated.  Cardiothoracic surgical consultation was requested.  Diagnostic cardiac catheterization has been scheduled but not yet performed.  Patient is married and lives with his wife in [place].  He has been disabled since he went on hemodialysis approximately 17 years ago.  He still drives an automobile and remains reasonably functional.  He has been morbidly obese for all of his adult life.  He states that he ambulates without significant difficulty.  He denies any symptoms of shortness of breath.  He has not been having problems with dialysis treatments recently.  He denies any history of fevers, chills, or night sweats.  Appetite is stable.  He complains of a dry persistent cough that is made worse laying flat in bed.  He has had episodes of PND.  He denies palpitations, dizzy spells, or syncope.  He has not had lower extremity edema.  He dialyzes on a Tuesday, Thursday, Saturday schedule at the [facility] dialysis center via a left forearm primary AV fistula.  He has not had any problems with dialysis access for the past 2 years.  He has been anticoagulated chronically using Eliquis because of previous history of clotting of dialysis grafts.  Past Medical History:  Diagnosis Date  . Aneurysm ([place])     Right arm fistula 3 aneurysms 2011,   plans to have a new procedure  . Angina    occasional, last 6 mo ago  . Chest discomfort     2008  .  CHF (congestive heart failure) ([place])   . Dialysis patient [facility])    M-W-F @ [place]  . Dysrhythmia   . Ejection fraction     65%, echo, 2008   /   EF 55-60%, echo, October 28, 2010  . ESRD (end stage renal disease) ([place])    on hemodialysis T_T_S  . Hypertension     . Leg pain   . Orthostatic hypotension     2008  . Overweight(278.02)   . Sinus tachycardia     2008, TSH normal ... happens frequently with heart rate at 120-130  . Syncope     positional after dialysis... 2008    Past Surgical History:  Procedure Laterality Date  . A/V FISTULAGRAM Left 06/30/2016   Procedure: A/V Fistulagram;  Surgeon: Algernon Huxley, MD;  Location: [place] [facility];  Service: Cardiovascular;  Laterality: Left;  . ARTERIOVENOUS GRAFT PLACEMENT    . AV FISTULA PLACEMENT    . AV FISTULA PLACEMENT Left 11/18/2015   Procedure: ARTERIOVENOUS (AV) FISTULA CREATION ( RADIOCEPHALIC );  Surgeon: Algernon Huxley, MD;  Location: [facility] ORS;  Service: Vascular;  Laterality: Left;  . AV FISTULA PLACEMENT Left 03/23/2016   Procedure: ARTERIOVENOUS (AV) FISTULA CREATION ( REVISION );  Surgeon: Algernon Huxley, MD;  Location: [facility] ORS;  Service: Vascular;  Laterality: Left;  . [place] REMOVAL Left 03/23/2016   Procedure: REMOVAL OF ARTERIOVENOUS GORETEX GRAFT ([place]);  Surgeon: Algernon Huxley, MD;  Location: [facility] OR[facility];  Service: Vascular;  Laterality: Left;  . DIALYSIS/PERMA CATHETER REMOVAL N/A 08/01/2016   Procedure: Dialysis/Perma Catheter Removal;  Surgeon: Algernon Huxley, MD;  Location: [place] [facility];  Service: Cardiovascular;  Laterality: N/A;  . HERNIA REPAIR    . INSERTION OF DIALYSIS CATHETER  03/01/2011   Procedure: INSERTION OF DIALYSIS CATHETER;  Surgeon: Elam Dutch, MD;  Location: [place];  Service: Vascular;  Laterality: Left;  Exchange of Dialysis Catheter to 27cm 15Fr. Arrow Catheter  . PERIPHERAL VASCULAR CATHETERIZATION N/A 09/01/2014   Procedure: A/V Shuntogram/Fistulagram;  Surgeon: Algernon Huxley, MD;  Location: [place] [facility];  Service: Cardiovascular;  Laterality: N/A;  . PERIPHERAL VASCULAR CATHETERIZATION Right 09/01/2014   Procedure: Thrombectomy;  Surgeon: Algernon Huxley, MD;  Location: [place] [facility];  Service: Cardiovascular;  Laterality: Right;  . PERIPHERAL  VASCULAR CATHETERIZATION Right 09/01/2014   Procedure: A/V Shunt Intervention;  Surgeon: Algernon Huxley, MD;  Location: [place] [facility];  Service: Cardiovascular;  Laterality: Right;  . PERIPHERAL VASCULAR CATHETERIZATION N/A 09/17/2014   Procedure: A/V Shuntogram/Fistulagram;  Surgeon: Algernon Huxley, MD;  Location: [place] [facility];  Service: Cardiovascular;  Laterality: N/A;  . PERIPHERAL VASCULAR CATHETERIZATION Right 10/17/2014   Procedure: Thrombectomy;  Surgeon: Katha Cabal, MD;  Location: [place] [facility];  Service: Cardiovascular;  Laterality: Right;  . PERIPHERAL VASCULAR CATHETERIZATION N/A 10/17/2014   Procedure: A/V Shuntogram/Fistulagram;  Surgeon: Katha Cabal, MD;  Location: [place] [facility];  Service: Cardiovascular;  Laterality: N/A;  . PERIPHERAL VASCULAR CATHETERIZATION N/A 10/17/2014   Procedure: A/V Shunt Intervention;  Surgeon: Katha Cabal, MD;  Location: [place] [facility];  Service: Cardiovascular;  Laterality: N/A;  . PERIPHERAL VASCULAR CATHETERIZATION Right 11/04/2014   Procedure: Thrombectomy;  Surgeon: Katha Cabal, MD;  Location: [place] [facility];  Service: Cardiovascular;  Laterality: Right;  . PERIPHERAL VASCULAR CATHETERIZATION Left 11/04/2014   Procedure: Visceral Venography;  Surgeon: Katha Cabal, MD;  Location: [facility];  Service: Cardiovascular;  Laterality: Left;  . PERIPHERAL VASCULAR CATHETERIZATION Right 11/27/2014   Procedure: A/V Shuntogram/Fistulagram;  Surgeon: Algernon Huxley, MD;  Location: [place] [facility];  Service: Cardiovascular;  Laterality: Right;  . PERIPHERAL VASCULAR CATHETERIZATION Right 11/27/2014   Procedure: A/V Shunt Intervention;  Surgeon: Algernon Huxley, MD;  Location: [place] [facility];  Service: Cardiovascular;  Laterality: Right;  . PERIPHERAL VASCULAR CATHETERIZATION Right 12/31/2014   Procedure: Thrombectomy;  Surgeon: Algernon Huxley, MD;  Location: [place] [facility];  Service:  Cardiovascular;  Laterality: Right;  . PERIPHERAL VASCULAR CATHETERIZATION Right 01/12/2015   Procedure: A/V Shuntogram/Fistulagram;  Surgeon: Algernon Huxley, MD;  Location: [place] [facility];  Service: Cardiovascular;  Laterality: Right;  . PERIPHERAL VASCULAR CATHETERIZATION N/A 01/12/2015   Procedure: A/V Shunt Intervention;  Surgeon: Algernon Huxley, MD;  Location: [place] [facility] LAB;  Service: Cardiovascular;  Laterality: N/A;  . PERIPHERAL VASCULAR CATHETERIZATION Right 01/28/2015   Procedure: Thrombectomy;  Surgeon: Algernon Huxley, MD;  Location: [place] [facility];  Service: Cardiovascular;  Laterality: Right;  . PERIPHERAL VASCULAR CATHETERIZATION N/A 06/08/2015   Procedure: A/V Shuntogram/Fistulagram;  Surgeon: Algernon Huxley, MD;  Location: [place] [facility];  Service: Cardiovascular;  Laterality: N/A;  . PERIPHERAL VASCULAR CATHETERIZATION N/A 06/08/2015   Procedure: A/V Shunt Intervention;  Surgeon: Algernon Huxley, MD;  Location: [place] [facility];  Service: Cardiovascular;  Laterality: N/A;  . PERIPHERAL VASCULAR CATHETERIZATION Right 07/06/2015   Procedure: A/V Shuntogram/Fistulagram;  Surgeon: Algernon Huxley, MD;  Location: [place] [facility];  Service: Cardiovascular;  Laterality: Right;  . PERIPHERAL VASCULAR CATHETERIZATION N/A 07/06/2015   Procedure: A/V Shunt Intervention;  Surgeon: Algernon Huxley, MD;  Location: [place] [facility];  Service: Cardiovascular;  Laterality: N/A;  . PERIPHERAL VASCULAR CATHETERIZATION N/A 08/04/2015   Procedure: graft declot;  Surgeon: Katha Cabal, MD;  Location: [place] [facility];  Service: Cardiovascular;  Laterality: N/A;  . PERIPHERAL VASCULAR CATHETERIZATION Right 08/25/2015   Procedure: A/V Shuntogram/Fistulagram;  Surgeon: Katha Cabal, MD;  Location: [place] [facility];  Service: Cardiovascular;  Laterality: Right;  . PERIPHERAL VASCULAR CATHETERIZATION N/A 11/04/2015   Procedure: Dialysis/Perma Catheter Insertion;  Surgeon: Algernon Huxley,  MD;  Location: [place] [facility];  Service: Cardiovascular;  Laterality: N/A;  . PERIPHERAL VASCULAR CATHETERIZATION Left 12/14/2015   Procedure: A/V Shuntogram/Fistulagram;  Surgeon: Algernon Huxley, MD;  Location: [place] [facility];  Service: Cardiovascular;  Laterality: Left;  . PERIPHERAL VASCULAR CATHETERIZATION N/A 12/14/2015   Procedure: A/V Shunt Intervention;  Surgeon: Algernon Huxley, MD;  Location: [place] [facility];  Service: Cardiovascular;  Laterality: N/A;  . PERIPHERAL VASCULAR CATHETERIZATION N/A 12/17/2015   Procedure: Dialysis/Perma Catheter Insertion;  Surgeon: Algernon Huxley, MD;  Location: [place] [facility];  Service: Cardiovascular;  Laterality: N/A;  . PERIPHERAL VASCULAR CATHETERIZATION Left 12/22/2015   Procedure: Dialysis/Perma Catheter Insertion;  Surgeon: Katha Cabal, MD;  Location: [place] [facility];  Service: Cardiovascular;  Laterality: Left;  . PERIPHERAL VASCULAR CATHETERIZATION Left 02/29/2016   Procedure: A/V Shuntogram/Fistulagram;  Surgeon: Algernon Huxley, MD;  Location: [place] [facility];  Service: Cardiovascular;  Laterality: Left;  . PERIPHERAL VASCULAR CATHETERIZATION N/A 02/29/2016   Procedure: A/V Shunt Intervention;  Surgeon: Algernon Huxley, MD;  Location: [place] [facility];  Service: Cardiovascular;  Laterality: N/A;  . right arm graft     for dyalisis  .  TEE WITHOUT CARDIOVERSION N/A 07/31/2017   Procedure: TRANSESOPHAGEAL ECHOCARDIOGRAM (TEE);  Surgeon: Josue Hector, MD;  Location: [facility] ENDOSCOPY;  Service: Cardiovascular;  Laterality: N/A;  . THROMBECTOMY    . THROMBECTOMY W/ EMBOLECTOMY  03/01/2011   Procedure: THROMBECTOMY ARTERIOVENOUS GORE-TEX GRAFT;  Surgeon: Elam Dutch, MD;  Location: [place];  Service: Vascular;  Laterality: Right;  Attempted Thrombectomy of Old  Right Upper Arm Arteriovenous gortex Graft. Insertion of new Arteriovenous Graft using 56mm x 50cm Gortex Stretch graft.   . THROMBECTOMY W/ EMBOLECTOMY  07/11/2011    Procedure: THROMBECTOMY ARTERIOVENOUS GORE-TEX GRAFT;  Surgeon: Rosetta Posner, MD;  Location: [place];  Service: Vascular;  Laterality: Right;  . UMBILICAL HERNIA REPAIR    . VENOGRAM N/A 08/23/2011   Procedure: VENOGRAM;  Surgeon: [name], MD;  Location: [facility] CATH LAB;  Service: Cardiovascular;  Laterality: N/A;    Family History  Problem Relation Age of Onset  . Hypertension Mother   . Heart disease Mother   . Hypertension Father   . Heart disease Father     Social History   Socioeconomic History  . Marital status: Married    Spouse name: Not on file  . Number of children: Not on file  . Years of education: Not on file  . Highest education level: Not on file  Occupational History  . Not on file  Social Needs  . Financial resource strain: Not on file  . Food insecurity:    Worry: Not on file    Inability: Not on file  . Transportation needs:    Medical: Not on file    Non-medical: Not on file  Tobacco Use  . Smoking status: Former Smoker    Types: Cigarettes    Last attempt to quit: 04/18/1994    Years since quitting: 23.3  . Smokeless tobacco: Current User    Types: Snuff  . Tobacco comment: dips 1/2 snuff per day times 25 years  Substance and Sexual Activity  . Alcohol use: No  . Drug use: No  . Sexual activity: Not on file  Lifestyle  . Physical activity:    Days per week: Not on file    Minutes per session: Not on file  . Stress: Not on file  Relationships  . Social connections:    Talks on phone: Not on file    Gets together: Not on file    Attends religious service: Not on file    Active member of club or organization: Not on file    Attends meetings of clubs or organizations: Not on file    Relationship status: Not on file  . Intimate partner violence:    Fear of current or ex partner: Not on file    Emotionally abused: Not on file    Physically abused: Not on file    Forced sexual activity: Not on file  Other Topics Concern  . Not on file    Social History Narrative   Single    Prior to Admission medications   Medication Sig Start Date End Date Taking? Authorizing Provider  apixaban (ELIQUIS) 5 MG TABS tablet Take 1 tablet (5 mg total) 2 (two) times daily by mouth. 02/23/17  Yes Algernon Huxley, MD  aspirin EC 81 MG tablet Take 81 mg by mouth every morning.   Yes [provider]  B Complex-C-Folic Acid (NEPHRO-VITE PO) Take 1 tablet by mouth at bedtime.    Yes [provider]  benzonatate (TESSALON) 100  MG capsule 2 po tid for cough Patient taking differently: Take 200 mg by mouth 2 (two) times daily as needed. 2 po tid for cough 07/14/17  Yes Lily Kocher, PA-C  calcium acetate (PHOSLO) 667 MG capsule Take 2,001 mg by mouth See admin instructions. 1334 mg 3 times a day with meals and 667 mg with snacks   Yes [provider]  cinacalcet (SENSIPAR) 90 MG tablet Take 90 mg by mouth daily.   Yes [provider]  FOSRENOL 500 MG chewable tablet Chew 2,000 mg by mouth See admin instructions. Take 2000 mg with meals and 1000 mg with snacks 05/28/13  Yes [provider]  midodrine (PROAMATINE) 5 MG tablet Take 1 tablet by mouth See admin instructions. Pt may take before dialysis treatment, Tues, Thurs and Sat 02/09/16  Yes [provider]  nitroGLYCERIN (NITROSTAT) 0.4 MG SL tablet Place 1 tablet (0.4 mg total) under the tongue every 5 (five) minutes as needed. For chest 05/30/13  Yes Richardson Dopp T, PA-C    Current Facility-Administered Medications  Medication Dose Route Frequency Provider Last Rate Last Dose  . 0.9 %  sodium chloride infusion  250 mL Intravenous PRN Nita Sells, MD      . acetaminophen (TYLENOL) tablet 650 mg  650 mg Oral Q4H PRN Nita Sells, MD      . ALPRAZolam Duanne Moron) tablet 0.25 mg  0.25 mg Oral BID PRN Nita Sells, MD      . benzonatate (TESSALON) capsule 200 mg  200 mg Oral TID PRN Nita Sells, MD      . calcium acetate  (PHOSLO) capsule 1,334 mg  1,334 mg Oral TID WC Nita Sells, MD   1,334 mg at 08/01/17 1152  . calcium acetate (PHOSLO) capsule 667 mg  667 mg Oral PRN Nita Sells, MD      . Chlorhexidine Gluconate Cloth 2 % PADS 6 each  6 each Topical Q0600 Nita Sells, MD      . cinacalcet (SENSIPAR) tablet 90 mg  90 mg Oral Q breakfast Nita Sells, MD   90 mg at 08/01/17 0842  . ivabradine (CORLANOR) tablet 5 mg  5 mg Oral BID WC Nita Sells, MD   5 mg at 08/01/17 0843  . lanthanum (FOSRENOL) chewable tablet 1,000 mg  1,000 mg Oral BID BM & HS PRN Nita Sells, MD   1,000 mg at 07/29/17 1708  . lanthanum (FOSRENOL) chewable tablet 2,000 mg  2,000 mg Oral TID WC Nita Sells, MD   2,000 mg at 08/01/17 1152  . metoprolol succinate (TOPROL-XL) 24 hr tablet 12.5 mg  12.5 mg Oral Daily Larey Dresser, MD   12.5 mg at 08/01/17 0843  . midodrine (PROAMATINE) tablet 5 mg  5 mg Oral Q T,Th,Sa-HD Nita Sells, MD   5 mg at 08/01/17 1152  . multivitamin (RENA-VIT) tablet 1 tablet  1 tablet Oral QHS Nita Sells, MD   1 tablet at 07/31/17 2253  . mupirocin ointment (BACTROBAN) 2 % 1 application  1 application Nasal BID Nita Sells, MD   1 application at 68/34/19 0843  . ondansetron (ZOFRAN) injection 4 mg  4 mg Intravenous Q6H PRN Samtani, Jai-Gurmukh, MD      . sodium chloride flush (NS) 0.9 % injection 3 mL  3 mL Intravenous Q12H Nita Sells, MD   3 mL at 08/01/17 1000  . sodium chloride flush (NS) 0.9 % injection 3 mL  3 mL Intravenous PRN Nita Sells, MD   3 mL at  08/01/17 0041  . zolpidem (AMBIEN) tablet 5 mg  5 mg Oral QHS PRN,MR X 1 Samtani, Jai-Gurmukh, MD        No Known Allergies    Review of Systems:   General:  normal appetite, decreased energy, no weight gain, no weight loss, no fever  Cardiac:  no chest pain with exertion, no chest pain at rest, +SOB with exertion, no resting SOB, + PND, +  orthopnea, no palpitations, no arrhythmia, no atrial fibrillation, no LE edema, no dizzy spells, no syncope  Respiratory:  no shortness of breath, no home oxygen, no productive cough, + persistent dry cough, no bronchitis, no wheezing, no hemoptysis, no asthma, no pain with inspiration or cough, possible sleep apnea, no CPAP at night  GI:   no difficulty swallowing, no reflux, no frequent heartburn, no hiatal hernia, no abdominal pain, no constipation, no diarrhea, no hematochezia, no hematemesis, no melena  GU:   Minimal UOP, no dysuria,  no frequency, no urinary tract infection, no hematuria, no enlarged prostate, no kidney stones, + kidney disease  Vascular:  no pain suggestive of claudication, no pain in feet, no leg cramps, + varicose veins, no DVT, no non-healing foot ulcer  Neuro:   no stroke, no TIA's, no seizures, no headaches, no temporary blindness one eye,  no slurred speech, no peripheral neuropathy, no chronic pain, mild instability of gait, no memory/cognitive dysfunction  Musculoskeletal: no arthritis , no joint swelling, no myalgias, no difficulty walking, normal mobility   Skin:   no rash, no itching, no skin infections, no pressure sores or ulcerations  Psych:   no anxiety, no depression, no nervousness, no unusual recent stress  Eyes:   no blurry vision, no floaters, no recent vision changes, does not wear glasses or contacts  ENT:   no hearing loss, no loose or painful teeth, no dentures, last saw dentist > 20 years ago  Hematologic:  no easy bruising, no abnormal bleeding, no clotting disorder, no frequent epistaxis  Endocrine:  no diabetes, does not check CBG's at home     Physical Exam:   BP (!) 90/59 (BP Location: Right Arm)   Pulse 96   Temp 98 F (36.7 C) (Oral)   Resp 19   Ht 6' (1.829 m)   Wt (!) 323 lb 4.8 oz (146.6 kg)   SpO2 92%   BMI 43.85 kg/m   General:  Morbidly obese male NAD    HEENT:  Unremarkable   Neck:   no JVD, no bruits, no adenopathy    Chest:   clear to auscultation, symmetrical breath sounds, no wheezes, no rhonchi   CV:   RRR, grade III/VI diastolic murmur best RSB  Abdomen:  soft, non-tender, no masses   Extremities:  warm, well-perfused, pulses not palpable, no lower extremity edema  Rectal/GU  Deferred  Neuro:   Grossly non-focal and symmetrical throughout  Skin:   Clean and dry, no rashes, no breakdown  Diagnostic Tests:  Lab Results: Recent Labs    07/31/17 1851  WBC 8.9  HGB 12.7*  HCT 39.9  PLT 283   BMET:  Recent Labs    07/31/17 0301 08/01/17 0403  NA 140 136  K 5.5* 3.8  CL 100* 97*  CO2 22 25  GLUCOSE 67 89  BUN 78* 46*  CREATININE 14.77* 10.55*  CALCIUM 9.1 8.5*    CBG (last 3)  No results for input(s): GLUCAP in the last 72 hours. PT/INR:   Recent Labs  08/01/17 1232  LABPROT 16.9*  INR 1.38    CXR:  CHEST - 2 VIEW  COMPARISON:  Chest x-ray dated July 14, 2017.  FINDINGS: The heart size and mediastinal contours are within normal limits. Prominent central pulmonary arteries, similar to prior study. No focal consolidation, pleural effusion, or pneumothorax. No acute osseous abnormality.  IMPRESSION: No active cardiopulmonary disease.   Electronically Signed   By: Titus Dubin M.D.   On: 07/28/2017 18:46     Transthoracic Echocardiography  Patient:    Dylann, Gallier MR #:       [id number] Study Date: 07/28/2017 Gender:     M Age:        27 Height:     182.9 cm Weight:     147.9 kg BSA:        2.81 m^2 Pt. Status: Room:   SONOGRAPHER  [facility], RVT, [place], MD  [place], MD  [place], MD  PERFORMING   [facility], [place]  cc:  ------------------------------------------------------------------- LV EF: 20% -   25%  ------------------------------------------------------------------- History:   PMH:   Tachycardia and syncope.  Congestive heart failure.  Risk  factors:  Former tobacco use.  ------------------------------------------------------------------- Study Conclusions  - Procedure narrative: Transthoracic echocardiography. Image   quality was adequate. The study was technically difficult, as a   result of poor acoustic windows, poor sound wave transmission,   and body habitus. - Left ventricle: Wall thickness was increased in a pattern of mild   LVH. Systolic function was severely reduced. The estimated   ejection fraction was in the range of 20% to 25%. The study is   not technically sufficient to allow evaluation of LV diastolic   function. - Aortic valve: Moderately calcified annulus. Trileaflet; mildly   calcified leaflets. There was mild to moderate regurgitation -   not well defined. Large, globular and calcified density noted in   LVOT just proximal to the aortic valve. Parasternal views suggest   potential attachment to the LVOT/annulus, however short axis   views are more consistent with attachment to the left coronary   cusp. Due to large size this appears to obstruct flow through the   LVOT which could be impacting cardiac output and therefore   resulting tachycardia. High suspicion for valvular vegetation,   although could be more chronic finding due to calcification. TEE   is indicated to evaluate further. - Mitral valve: Moderately to severely calcified annulus. There was   mild regurgitation. Valve area by continuity equation (using LVOT   flow): 2.45 cm^2. - Left atrium: The atrium was moderately dilated. - Right ventricle: Poorly visualized. Systolic function was   reduced. - Tricuspid valve: Mildly thickened leaflets. There was mild   regurgitation. Peak RV-RA gradient (S): 23 mm Hg. - Pericardium, extracardiac: There was no pericardial effusion.  Impressions:  - Large, globular and calcified density noted in LVOT just proximal   to the aortic valve. Parasternal views suggest potential   attachment to the  LVOT/annulus, however short axis views are more   consistent with attachment to the left coronary cusp. Due to   large size this appears to obstruct flow through the LVOT which   could be impacting cardiac output and therefore resulting   tachycardia. High suspicion for valvular vegetation, although   could be more chronic finding due to calcification. TEE is   indicated to evaluate further. Ordering  provider out of the   office. Patient being contacted by nursing and directed to ER at   [facility] in anticipation of admission for further evaluation of   both cardiomyopathy and potential endocarditis.  ------------------------------------------------------------------- Study data:  Comparison was made to the study of July 2012. LVEF 60%, no RWMA.  Study status:  Routine.  Procedure:  Transthoracic echocardiography. Image quality was adequate. The study was technically difficult, as a result of poor acoustic windows, poor sound wave transmission, and body habitus.  Study completion: There were no complications.          Transthoracic echocardiography.  M-mode, complete 2D, spectral Doppler, and color Doppler.  Birthdate:  Patient birthdate: 10-10-1965.  Age:  Patient is 52 yr old.  Sex:  Gender: male.    BMI: 44.2 kg/m^2.  Blood pressure:     133/84  Patient status:  Outpatient.  Study date: Study date: 07/28/2017. Study time: 05:07 PM.  -------------------------------------------------------------------  ------------------------------------------------------------------- Left ventricle:   Wall thickness was increased in a pattern of mild LVH.   Systolic function was severely reduced. The estimated ejection fraction was in the range of 20% to 25%. Images were inadequate for LV wall motion assessment. The study is not technically sufficient to allow evaluation of LV diastolic function.  ------------------------------------------------------------------- Aortic valve:   Moderately  calcified annulus. Trileaflet; mildly calcified leaflets.  Doppler:  There was mild to moderate regurgitation - not well defined. Large, globular and calcified density noted in LVOT just proximal to the aortic valve. 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 Due to large size this appears to obstruct flow through the LVOT which could be impacting cardiac output and therefore resulting tachycardia. High suspicion for valvular vegetation, although could be more chronic finding due to calcification. TEE is indicated to evaluate further.  ------------------------------------------------------------------- Aorta:  Aortic root: The aortic root was normal in size.  ------------------------------------------------------------------- Mitral valve:   Moderately to severely calcified annulus.  Doppler:  There was mild regurgitation.    Valve area by continuity equation (using LVOT flow): 2.45 cm^2. Indexed valve area by continuity equation (using LVOT flow): 0.87 cm^2/m^2.    Mean gradient (D): 4 mm Hg. Peak gradient (D): 10 mm Hg.  ------------------------------------------------------------------- Left atrium:  The atrium was moderately dilated.  ------------------------------------------------------------------- Right ventricle:  Poorly visualized. The cavity size was normal. Systolic function was reduced.  ------------------------------------------------------------------- Pulmonic valve:    The valve appears to be grossly normal. Doppler:  There was trivial regurgitation.  ------------------------------------------------------------------- Tricuspid valve:   Mildly thickened leaflets.  Doppler:  There was mild regurgitation.  ------------------------------------------------------------------- Right atrium:  The atrium was normal in  size.  ------------------------------------------------------------------- Pericardium:  There was no pericardial effusion.  ------------------------------------------------------------------- Systemic veins: Inferior vena cava: Poorly visualized.  ------------------------------------------------------------------- Measurements   Left ventricle                            Value          Reference  LV ID, ED, PLAX chordal            (H)    64.9  mm       43 - 52  LV ID, ES, PLAX chordal            (H)    48.5  mm       23 - 38  LV  fx shortening, PLAX chordal     (L)    25    %        >=29  LV PW thickness, ED                       10.7  mm       ---------  IVS/LV PW ratio, ED                       1              <=1.3  Stroke volume, 2D                         70    ml       ---------  Stroke volume/bsa, 2D                     25    ml/m^2   ---------  Longitudinal strain, TDI                  6     %        ---------    Ventricular septum                        Value          Reference  IVS thickness, ED                         10.7  mm       ---------    LVOT                                      Value          Reference  LVOT ID, S                                21    mm       ---------  LVOT area                                 3.46  cm^2     ---------  LVOT peak velocity, S                     103   cm/s     ---------  LVOT mean velocity, S                     81.3  cm/s     ---------  LVOT VTI, S                               20.3  cm       ---------    Aortic valve                              Value          Reference  Aortic regurg pressure half-time  277   ms       ---------    Aorta                                     Value          Reference  Aortic root ID, ED                        36    mm       ---------    Left atrium                               Value          Reference  LA ID, A-P, ES                            47    mm       ---------  LA ID/bsa,  A-P                            1.67  cm/m^2   <=2.2  LA volume, S                              151   ml       ---------  LA volume/bsa, S                          53.7  ml/m^2   ---------  LA volume, ES, 1-p A4C                    124   ml       ---------  LA volume/bsa, ES, 1-p A4C                44.1  ml/m^2   ---------  LA volume, ES, 1-p A2C                    157   ml       ---------  LA volume/bsa, ES, 1-p A2C                55.9  ml/m^2   ---------    Mitral valve                              Value          Reference  Mitral E-wave peak velocity               159   cm/s     ---------  Mitral A-wave peak velocity               57    cm/s     ---------  Mitral mean velocity, D                   89    cm/s     ---------  Mitral deceleration time           (L)    87    ms       150 -  230  Mitral mean gradient, D                   4     mm Hg    ---------  Mitral peak gradient, D                   10    mm Hg    ---------  Mitral E/A ratio, peak                    2.8            ---------  Mitral valve area, LVOT continuity        2.45  cm^2     ---------  Mitral valve area/bsa, LVOT               0.87  cm^2/m^2 ---------  continuity  Mitral annulus VTI, D                     28.7  cm       ---------  Mitral regurg VTI, PISA                   123   cm       ---------  Mitral ERO, PISA                          0.24  cm^2     ---------  Mitral regurg volume, PISA                30    ml       ---------    Tricuspid valve                           Value          Reference  Tricuspid peak RV-RA gradient             23    mm Hg    ---------    Right ventricle                           Value          Reference  TAPSE                                     27.2  mm       ---------  Legend: (L)  and  (H)  mark values outside specified reference range.  ------------------------------------------------------------------- Prepared and Electronically Authenticated by  Rozann Lesches,  M.D. 2019-04-12T16:35:28   Transesophageal Echocardiography  Patient:    Alaa, Eyerman MR #:       [id number] Study Date: 07/31/2017 Gender:     M Age:        18 Height:     182.9 cm Weight:     148.5 kg BSA:        2.82 m^2 Pt. Status: Room:   PERFORMING   Jenkins Rouge, M.D.  [address]    [place] [id number]  [id number]     [place], [place] [id number]  [place] [id number]  ADMITTING    Vianne Bulls  SONOGRAPHER  Cardell Peach, RDCS  cc:  ------------------------------------------------------------------- LV EF: 25% -   30%  -------------------------------------------------------------------  History:   PMH:  Aortic Valve mass  Syncope.  Congestive heart failure.  Risk factors:  Hypertension.   ------------------------------------------------------------------- Study Conclusions  - Left ventricle: Hypertrophy was noted. Systolic function was   severely reduced. The estimated ejection fraction was in the   range of 25% to 30%. Diffuse hypokinesis. - Aortic valve: Valve is trileaflet and severely thickened. Did not   think there was a mass or vegetation on valve Nodular   calcification of leaflets   wtith bulky calcium. There was significatn shadowing of the mid   esohageal views obscuring the LVOT but had good images   from trans gastic imaging There was mild stenosis. There was   severe regurgitation. - Mitral valve: Moderately to severely calcified annulus.   Moderately thickened, moderately calcified leaflets . There was   moderate regurgitation. - Left atrium: The atrium was dilated. No evidence of thrombus in   the atrial cavity or appendage. - Right ventricle: The cavity size was mildly dilated. - Right atrium: No evidence of thrombus in the atrial cavity or   appendage. - Atrial septum: No defect or patent foramen ovale was identified. - Impressions: 3D rendering of the Aortic and mitral valves was   performed. Note patient was intubated  for procedure by Dr Glennon Mac   Anesthesia due   to low BP, coughing and need for propopofol for sedation.  Impressions:  - 3D rendering of the Aortic and mitral valves was performed. Note   patient was intubated for procedure by Dr Glennon Mac Anesthesia due   to low BP, coughing and need for propopofol for sedation.  ------------------------------------------------------------------- Study data:   Study status:  Routine.  Consent:  The risks, benefits, and alternatives to the procedure were explained to the patient and informed consent was obtained.  Procedure:  Initial setup. The patient was brought to the laboratory. Surface ECG leads were monitored. Sedation. Conscious sedation was administered by anesthesiology staff. Transesophageal echocardiography. Topical anesthesia was obtained using viscous lidocaine. A transesophageal probe was inserted by the attending cardiologist. Image quality was adequate.  Study completion:  The patient tolerated the procedure well. There were no complications.          Diagnostic transesophageal echocardiography.  2D and color Doppler. Birthdate:  Patient birthdate: December 16, 1965.  Age:  Patient is 52 yr old.  Sex:  Gender: male.    BMI: 44.4 kg/m^2.  Blood pressure: 109/68  Patient status:  Inpatient.  Study date:  Study date: 07/31/2017. Study time: 12:13 PM.  Location:  Endoscopy.  -------------------------------------------------------------------  ------------------------------------------------------------------- Left ventricle:  Hypertrophy was noted. Systolic function was severely reduced. The estimated ejection fraction was in the range of 25% to 30%. Diffuse hypokinesis.  ------------------------------------------------------------------- Aortic valve:  Valve is trileaflet and severely thickened. Did not think there was a mass or vegetation on valve Nodular calcification of leaflets wtith bulky calcium. There was significatn shadowing  of the mid esohageal views obscuring the LVOT but had good images from trans gastic imaging  Trileaflet; severely thickened, severely calcified leaflets.  Doppler:   There was mild stenosis.   There was severe regurgitation.    VTI ratio of LVOT to aortic valve: 0.37. Peak velocity ratio of LVOT to aortic valve: 0.4. Mean velocity ratio of LVOT to aortic valve: 0.4.    Mean gradient (S): 3 mm Hg. Peak gradient (S): 5 mm Hg.  ------------------------------------------------------------------- Mitral valve:   Moderately to severely calcified annulus. Moderately thickened, moderately calcified leaflets .  Doppler: There was moderate regurgitation.  -------------------------------------------------------------------  Left atrium:  The atrium was dilated.  No evidence of thrombus in the atrial cavity or appendage.  ------------------------------------------------------------------- Atrial septum:  No defect or patent foramen ovale was identified.   ------------------------------------------------------------------- Right ventricle:  The cavity size was mildly dilated.  ------------------------------------------------------------------- Pulmonic valve:    Doppler:  There was mild regurgitation.  ------------------------------------------------------------------- Tricuspid valve:   Doppler:  There was mild regurgitation.  ------------------------------------------------------------------- Right atrium:  The atrium was normal in size.  No evidence of thrombus in the atrial cavity or appendage.  ------------------------------------------------------------------- Pericardium:  The pericardium was normal in appearance. There was no pericardial effusion.  ------------------------------------------------------------------- Measurements   LVOT                                 Value  LVOT peak velocity, S                47    cm/s  LVOT mean velocity, S                32.9  cm/s   LVOT VTI, S                          7.97  cm  LVOT peak gradient, S                1     mm Hg    Aortic valve                         Value  Aortic valve peak velocity, S        117   cm/s  Aortic valve mean velocity, S        81.9  cm/s  Aortic valve VTI, S                  21.8  cm  Aortic mean gradient, S              3     mm Hg  Aortic peak gradient, S              5     mm Hg  VTI ratio, LVOT/AV                   0.37  Velocity ratio, peak, LVOT/AV        0.4  Velocity ratio, mean, LVOT/AV        0.4  Aortic regurg pressure half-time     255   ms  Legend: (L)  and  (H)  mark values outside specified reference range.  ------------------------------------------------------------------- Prepared and Electronically Authenticated by  Jenkins Rouge, M.D. 2019-04-15T13:30:36    Impression:  Patient has stage D severe symptomatic primary aortic insufficiency and moderate mitral regurgitation with severe left ventricular systolic dysfunction and long-standing history of hypertension with diastolic dysfunction complicated by dialysis dependent end-stage renal disease.  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  I have personally reviewed the patient's recent transthoracic and transesophageal echocardiograms.  The aortic valve is trileaflet.  There is severe aortic insufficiency with an eccentric jet suggestive of possible leaflet prolapse and/or perforation.  There is very mild thickening of the leaflets but nothing that appears pathognomonic for underlying rheumatic disease.  There is no clear evidence of vegetation or leaflet perforation, but it is possible the patient's valvular disease could be related to previous history of bacterial endocarditis.  The patient has moderate mitral regurgitation with what appears to be primarily  normal leaflet mobility.  This may be purely secondary MR although there is some sclerosis of the posterior leaflet, posterior mitral annular calcification, and the possibility of significant leaflet restriction.  I agree the patient will need aortic valve repair or replacement.  It is possible that he might need concomitant mitral valve repair as well.  Diagnostic cardiac catheterization needs to be performed and has been scheduled tomorrow to rule out the presence of significant coronary artery disease and evaluate right-sided pressures and cardiac output.  At this point there does not appear to be any need for urgent surgical intervention and there are no convincing findings to suggest the presence has ongoing bacterial endocarditis.  The patient has not seen a dentist in many years and will need dental examination and cleaning prior to surgery.    Plan:  The patient and his wife counseled at length regarding treatment alternatives for management of severe aortic insufficiency including continued medical therapy versus proceeding with aortic valve repair or replacement in the near future.  The natural history of aortic insufficiency was reviewed, as was long term prognosis with medical therapy alone.  The possible need for mitral valve repair/replacement was discussed.  Patient is interested in proceeding with definitive surgical management at some point in the near future.  We will wait results for left and right heart catheterization planned for tomorrow.  Dental consultation will be requested.  We will continue to follow while the patient remains in the hospital and make plans for follow-up in surgery as soon as practical.  All questions answered.   I spent in excess of 120 minutes during the conduct of this hospital consultation and >50% of this time involved direct face-to-face encounter for counseling and/or coordination of the patient's care.   Valentina Gu. Roxy Manns, MD 08/01/2017 1:25 PM

## 2017-08-01 NOTE — Progress Notes (Signed)
  [facility] Progress Note   Assessment/ Plan:    1  ESRD - on HD TTS, HD yesterday and now today to get back on schedule.  Midodrine before HD for BP support--> in light of new TEE findings I think that AR and reduced LVEF likely cause of low BP on dialysis.  2.  Severe AR and mod MR: CV surg c/s, for [facility].  Per heart failure and cardiac surgery  3. Hypotension - takes midodrine pre HD  4  MBD ckd - cont binders  5  Anemia ckd - Hb 13, no need for esa   Subjective:    S/p TEE showing calcification of mitral and aortic valves, EF 25-30%, severe AR, mod MR  For R/LHC and CV surg c/s.  Tolerated HD yesterday, on schedule for today to get back to TTS schedule.     Objective:   BP (!) 90/59 (BP Location: Right Arm)   Pulse 96   Temp 98 F (36.7 C) (Oral)   Resp 19   Ht 6' (1.829 m)   Wt (!) 146.6 kg (323 lb 4.8 oz)   SpO2 92%   BMI 43.85 kg/m   Physical Exam: Gen: sitting in chair, NAD CVS: RRR, systolic and diastolic murmur present Resp: clear bilaterally Abd: obese Ext: no LE edema  Labs: BMET Recent Labs  Lab 07/28/17 1830 07/30/17 0436 07/31/17 0301 08/01/17 0403  NA 141 138 140 136  K 4.2 5.1 5.5* 3.8  CL 100* 100* 100* 97*  CO2 25 22 22 25   GLUCOSE 101* 82 67 89  BUN 45* 63* 78* 46*  CREATININE 10.46* 12.54* 14.77* 10.55*  CALCIUM 9.2 9.2 9.1 8.5*   CBC Recent Labs  Lab 07/28/17 1830 07/31/17 1851  WBC 9.7 8.9  HGB 13.2 12.7*  HCT 41.7 39.9  MCV 81.6 81.1  PLT 283 283    @IMGRELPRIORS @ Medications:    . calcium acetate  1,334 mg Oral TID WC  . Chlorhexidine Gluconate Cloth  6 each Topical Q0600  . cinacalcet  90 mg Oral Q breakfast  . ivabradine  5 mg Oral BID WC  . lanthanum  2,000 mg Oral TID WC  . metoprolol succinate  12.5 mg Oral Daily  . midodrine  5 mg Oral Q T,Th,Sa-HD  . multivitamin  1 tablet Oral QHS  . mupirocin ointment  1 application Nasal BID  . sodium chloride flush  3 mL Intravenous Q12H      Madelon Lips, MD [place] pgr [phone] 08/01/2017, 1:27 PM

## 2017-08-01 NOTE — Progress Notes (Addendum)
Advanced Heart Failure Rounding Note  PCP-Cardiologist: Kate Sable, MD   Subjective:    Noted to shortly go into brief episodes of tachycardia overnight as high as 140 bpm. Broke with Iv lopressor.   Feeling OK today. Feels like his extra fluid is off. Denies CP  TEE 07/31/17 AV is tri leaflet and severely calcified. ? Rheumatic There is no vegetation seen There is severe AR and mild AS MAC with moderate [name] EF 25-30% diffuse hypokinesis No LAA thormbus Bi Atrial Enlargement  Mild RV enlargement   Objective:   Weight Range: (!) 323 lb 4.8 oz (146.6 kg) Body mass index is 43.85 kg/m.   Vital Signs:   Temp:  [97.3 F (36.3 C)-98.8 F (37.1 C)] 98 F (36.7 C) (04/16 0431) Pulse Rate:  [77-93] 92 (04/16 0431) Resp:  [16-33] 18 (04/16 0431) BP: (84-109)/(43-68) 90/59 (04/16 0431) SpO2:  [93 %-100 %] 95 % (04/16 0431) Weight:  [323 lb 4.8 oz (146.6 kg)-329 lb 9.4 oz (149.5 kg)] 323 lb 4.8 oz (146.6 kg) (04/16 0431) Last BM Date: 07/31/17  Weight change: Filed Weights   07/31/17 1755 07/31/17 2210 08/01/17 0431  Weight: (!) 329 lb 9.4 oz (149.5 kg) (!) 324 lb 1.2 oz (147 kg) (!) 323 lb 4.8 oz (146.6 kg)   Intake/Output:   Intake/Output Summary (Last 24 hours) at 08/01/2017 0820 Last data filed at 07/31/2017 2300 Gross per 24 hour  Intake 611 ml  Output 2500 ml  Net -1889 ml    Physical Exam    General:  Well appearing. No resp difficulty HEENT: Normal Neck: Supple. JVP difficult. Appears ~9-10 cm. Carotids 2+ bilat; no bruits. No lymphadenopathy or thyromegaly appreciated. Cor: PMI nondisplaced. Regular rate & rhythm. No rubs, gallops.  1/6 diastolic murmur SB. Lungs: Clear Abdomen: Soft, nontender, nondistended. No hepatosplenomegaly. No bruits or masses. Good bowel sounds. Extremities: No cyanosis, clubbing, rash, edema Neuro: Alert & orientedx3, cranial nerves grossly intact. moves all 4 extremities w/o difficulty. Affect pleasant  Telemetry   NSR  80-90s this am, personally. Overnight Tachy into 130-140s. Broke with IV lopressor.   EKG    No new tracings.    Labs    CBC Recent Labs    07/31/17 1851  WBC 8.9  HGB 12.7*  HCT 39.9  MCV 81.1  PLT 250   Basic Metabolic Panel Recent Labs    07/31/17 0301 08/01/17 0403  NA 140 136  K 5.5* 3.8  CL 100* 97*  CO2 22 25  GLUCOSE 67 89  BUN 78* 46*  CREATININE 14.77* 10.55*  CALCIUM 9.1 8.5*   Liver Function Tests Recent Labs    08/01/17 0403  AST 14*  ALT 9*  ALKPHOS 111  BILITOT 0.9  PROT 7.1  ALBUMIN 2.9*   No results for input(s): LIPASE, AMYLASE in the last 72 hours. Cardiac Enzymes No results for input(s): CKTOTAL, CKMB, CKMBINDEX, TROPONINI in the last 72 hours.  BNP: BNP (last 3 results) Recent Labs    07/28/17 1831  BNP 1,098.0*    ProBNP (last 3 results) No results for input(s): PROBNP in the last 8760 hours.   D-Dimer No results for input(s): DDIMER in the last 72 hours. Hemoglobin A1C No results for input(s): HGBA1C in the last 72 hours. Fasting Lipid Panel No results for input(s): CHOL, HDL, LDLCALC, TRIG, CHOLHDL, LDLDIRECT in the last 72 hours. Thyroid Function Tests No results for input(s): TSH, T4TOTAL, T3FREE, THYROIDAB in the last 72 hours.  Invalid input(s): FREET3  Other results:   Imaging     No results found.   Medications:     Scheduled Medications: . calcium acetate  1,334 mg Oral TID WC  . Chlorhexidine Gluconate Cloth  6 each Topical Q0600  . cinacalcet  90 mg Oral Q breakfast  . ivabradine  5 mg Oral BID WC  . lanthanum  2,000 mg Oral TID WC  . metoprolol succinate  12.5 mg Oral Daily  . midodrine  5 mg Oral Q T,Th,Sa-HD  . multivitamin  1 tablet Oral QHS  . mupirocin ointment  1 application Nasal BID  . sodium chloride flush  3 mL Intravenous Q12H     Infusions: . sodium chloride       PRN Medications:  sodium chloride, acetaminophen, ALPRAZolam, benzonatate, calcium acetate, lanthanum,  ondansetron (ZOFRAN) IV, sodium chloride flush, zolpidem    Patient Profile   [name] [name] is a 52 year old with history of ESRD TTS [place], smoker, sinus tach, AV graft thrombosis, and morbid obesity.   Newly reduced EF and vegetation on LVOT.   Assessment/Plan   1. Valvular heart disease - TEE 07/31/17 with AV that is tri leaflet and severely calcified. ? Rheumatic There is no vegetation seen There is severe AR and mild AS - Will plan [facility] tomorrow once optimize with HD.  - Cardiac Surgery has been consulted.  2. Acute Sysotolic Heart Failure ECHO 10/28/2010 EF was normal and had normal aortic valve. ECHO 07/28/2017 EF 20-25% with severe AR. Has never had rheumatic fever. Plan for RHC/LHC 08/02/17 to further evaluated.  Volume status per iHD. Volume status elevated.   No room for hydralazine/imdur with ongoing hypotension. On midodrine.  - Start Toprol xl 12.5 mg daily. (Received lopressor yesterday) - No Ace/arb/spiro with ESRD. He requires midodrine with HD.  - Plan R/LHC tomorrow at 1100 3.  Concern for Endocarditis on ECHO  Blood CX obtained. NGTD No vegetation on TEE 4. Sinus Tach  - On ivabradine 5 mg bid - Given IV lopressor overnight.  May need to increase Toprol 5.  ESRD - TTS schedule - Has been getting daily here to optimize.  - On eliquis due to av thrombosis.  6. Suspected OSA - Has outpatient sleep study 08/08/2017. May have to reschedule.  7. Obesity - Body mass index is 43.85 kg/m.   Medication concerns reviewed with patient and pharmacy team. Barriers identified: None at this time.   Length of Stay: 3  Annamaria Helling  08/01/2017, 8:20 AM  Advanced Heart Failure Team Pager [phone] (M-F; 7a - 4p)  Please contact [facility] for night-coverage after hours (4p -7a ) and weekends on [email or web address]   Patient seen with PA, agree with the above note.  He had HD yesterday and will get HD again today.  Volume status looks better.   1. 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  No vegetation seen.  Blood cultures negative.  Severe AI could certainly potentiate CHF. This was not see on 2012 echo.  The LV is not significantly dilated, so not sure AI is the only explanation for his cardiomyopathy.  - I will involve cardiac surgery as I suspect his aortic valve will need surgical replacement versus TAVR as he would be at at least moderate surgical risk.  - He will need RHC/LHC prior to any valve intervention.  He is going to  have HD again today for fluid removal, will plan cath Wednesday morning.  Risks/benefits explained to patient and he agrees to proceed.  Holding apixaban (on to keep his dialysis access open).  2. Acute on ?chronic systolic CHF:  Echo with EF 20-25%.  As LV is not markedly dilated, I am not sure that aortic insufficiency can be invoked as the only cause for cardiomyopathy.  He has chronic sinus tachycardia x years but HR has been in 100s-110s, do not think this would have been likely to cause a tachy-mediated CMP.  Need to rule out coronary disease given h/o HTN and ESRD.  Volume status improved on exam, breathing is better.   - As above, plan RHC/LHC on Wednesday.  - Agree with Corlanor 5 mg bid and Toprol XL 12.5 mg daily.  He feels better with lower HR.  - No BP room to add other HF meds, he actually requires midodrine with HD.  3. ESRD: Still volume overloaded, will have HD today prior to cath Wednesday.  4. Suspected OSA: Needs sleep study.  5. ?SVT: Report of SVT overnight, but I can only find a couple of 3 beat NSVT runs on telemetry.   Loralie Champagne 08/01/2017 12:53 PM

## 2017-08-01 NOTE — Care Management Note (Signed)
Case Management Note Marvetta Gibbons RN, BSN Unit 4E-Case Manager [phone]  Patient Details  Name: [name] [name] MRN: [id number] Date of Birth: 19-Nov-1965  Subjective/Objective:  Pt admitted with abnormal echo, newly reduced EF and vegetation on LVOT.  Acute HF                  Action/Plan: PTA Pt lived at home with spouse- independent, CM to follow for transition of care needs- per MD note plan for right heart cath on 4/17.   Expected Discharge Date:                  Expected Discharge Plan:  Home/[name]  In-House Referral:     Discharge planning Services  CM Consult  Post Acute Care Choice:    Choice offered to:     DME Arranged:    DME Agency:     HH Arranged:    HH Agency:     Status of Service:  In process, will continue to follow  If discussed at Long Length of Stay Meetings, dates discussed:    Discharge Disposition:  Additional Comments:  Dawayne Patricia, RN 08/01/2017, 10:25 AM

## 2017-08-02 ENCOUNTER — Encounter (HOSPITAL_COMMUNITY): Payer: Self-pay | Admitting: Thoracic Surgery (Cardiothoracic Vascular Surgery)

## 2017-08-02 ENCOUNTER — Encounter (HOSPITAL_COMMUNITY): Payer: Medicare Other

## 2017-08-02 ENCOUNTER — Encounter (HOSPITAL_COMMUNITY)
Admission: EM | Disposition: A | Discharge status: Home / Self Care | Payer: Self-pay | Source: Home / Self Care | Attending: Family Medicine

## 2017-08-02 DIAGNOSIS — G4733 Obstructive sleep apnea (adult) (pediatric): Secondary | ICD-10-CM

## 2017-08-02 DIAGNOSIS — I340 Nonrheumatic mitral (valve) insufficiency: Secondary | ICD-10-CM | POA: Diagnosis present

## 2017-08-02 DIAGNOSIS — I471 Supraventricular tachycardia: Secondary | ICD-10-CM

## 2017-08-02 DIAGNOSIS — Z01818 Encounter for other preprocedural examination: Secondary | ICD-10-CM

## 2017-08-02 DIAGNOSIS — I5043 Acute on chronic combined systolic (congestive) and diastolic (congestive) heart failure: Secondary | ICD-10-CM

## 2017-08-02 DIAGNOSIS — I351 Nonrheumatic aortic (valve) insufficiency: Secondary | ICD-10-CM

## 2017-08-02 DIAGNOSIS — I2510 Atherosclerotic heart disease of native coronary artery without angina pectoris: Secondary | ICD-10-CM

## 2017-08-02 DIAGNOSIS — I429 Cardiomyopathy, unspecified: Secondary | ICD-10-CM

## 2017-08-02 HISTORY — PX: RIGHT/LEFT HEART CATH AND CORONARY ANGIOGRAPHY: CATH118266

## 2017-08-02 LAB — CBC WITH DIFFERENTIAL/PLATELET
BASOS ABS: 0 10*3/uL (ref 0.0–0.1)
BASOS PCT: 0 %
EOS PCT: 10 %
Eosinophils Absolute: 0.7 10*3/uL (ref 0.0–0.7)
HEMATOCRIT: 39.4 % (ref 39.0–52.0)
Hemoglobin: 12.2 g/dL — ABNORMAL LOW (ref 13.0–17.0)
LYMPHS PCT: 19 %
Lymphs Abs: 1.4 10*3/uL (ref 0.7–4.0)
MCH: 25.4 pg — ABNORMAL LOW (ref 26.0–34.0)
MCHC: 31 g/dL (ref 30.0–36.0)
MCV: 82.1 fL (ref 78.0–100.0)
Monocytes Absolute: 0.4 10*3/uL (ref 0.1–1.0)
Monocytes Relative: 6 %
NEUTROS ABS: 4.9 10*3/uL (ref 1.7–7.7)
Neutrophils Relative %: 65 %
PLATELETS: 225 10*3/uL (ref 150–400)
RBC: 4.8 MIL/uL (ref 4.22–5.81)
RDW: 15.6 % — ABNORMAL HIGH (ref 11.5–15.5)
WBC: 7.4 10*3/uL (ref 4.0–10.5)

## 2017-08-02 LAB — HEPATIC FUNCTION PANEL
ALBUMIN: 2.9 g/dL — AB (ref 3.5–5.0)
ALT: 9 U/L — AB (ref 17–63)
AST: 11 U/L — AB (ref 15–41)
Alkaline Phosphatase: 103 U/L (ref 38–126)
Bilirubin, Direct: 0.1 mg/dL (ref 0.1–0.5)
Indirect Bilirubin: 0.6 mg/dL (ref 0.3–0.9)
Total Bilirubin: 0.7 mg/dL (ref 0.3–1.2)
Total Protein: 6.9 g/dL (ref 6.5–8.1)

## 2017-08-02 LAB — POCT I-STAT 3, VENOUS BLOOD GAS (G3P V)
Acid-Base Excess: 1 mmol/L (ref 0.0–2.0)
Bicarbonate: 25.4 mmol/L (ref 20.0–28.0)
Bicarbonate: 26 mmol/L (ref 20.0–28.0)
O2 SAT: 58 %
O2 Saturation: 64 %
TCO2: 27 mmol/L (ref 22–32)
TCO2: 27 mmol/L (ref 22–32)
pCO2, Ven: 41.1 mmHg — ABNORMAL LOW (ref 44.0–60.0)
pCO2, Ven: 42.1 mmHg — ABNORMAL LOW (ref 44.0–60.0)
pH, Ven: 7.398 (ref 7.250–7.430)
pH, Ven: 7.399 (ref 7.250–7.430)
pO2, Ven: 30 mmHg — CL (ref 32.0–45.0)
pO2, Ven: 33 mmHg (ref 32.0–45.0)

## 2017-08-02 LAB — BASIC METABOLIC PANEL
ANION GAP: 15 (ref 5–15)
BUN: 60 mg/dL — ABNORMAL HIGH (ref 6–20)
CO2: 24 mmol/L (ref 22–32)
Calcium: 8.6 mg/dL — ABNORMAL LOW (ref 8.9–10.3)
Chloride: 97 mmol/L — ABNORMAL LOW (ref 101–111)
Creatinine, Ser: 12.68 mg/dL — ABNORMAL HIGH (ref 0.61–1.24)
GFR, EST AFRICAN AMERICAN: 5 mL/min — AB (ref >60)
GFR, EST NON AFRICAN AMERICAN: 4 mL/min — AB (ref >60)
Glucose, Bld: 83 mg/dL (ref 65–99)
POTASSIUM: 4.3 mmol/L (ref 3.5–5.1)
SODIUM: 136 mmol/L (ref 135–145)

## 2017-08-02 LAB — PHOSPHORUS: Phosphorus: 6.4 mg/dL — ABNORMAL HIGH (ref 2.5–4.6)

## 2017-08-02 LAB — MAGNESIUM: Magnesium: 2.1 mg/dL (ref 1.7–2.4)

## 2017-08-02 SURGERY — RIGHT/LEFT HEART CATH AND CORONARY ANGIOGRAPHY
Anesthesia: LOCAL

## 2017-08-02 MED ORDER — SODIUM CHLORIDE 0.9% FLUSH: 3.0000 mL | INTRAVENOUS | Status: DC | PRN

## 2017-08-02 MED ORDER — MIDODRINE HCL 5 MG PO TABS
ORAL_TABLET | ORAL | Status: AC
  Filled 2017-08-02: qty 1

## 2017-08-02 MED ORDER — ADENOSINE 6 MG/2ML IV SOLN
INTRAVENOUS | Status: DC | PRN
  Administered 2017-08-02: 12 mg via INTRAVENOUS
  Administered 2017-08-02: 6 mg via INTRAVENOUS

## 2017-08-02 MED ORDER — ADENOSINE 6 MG/2ML IV SOLN
INTRAVENOUS | Status: AC
  Filled 2017-08-02: qty 2

## 2017-08-02 MED ORDER — SODIUM CHLORIDE 0.9 % IV SOLN: INTRAVENOUS | Status: DC

## 2017-08-02 MED ORDER — SODIUM CHLORIDE 0.9% FLUSH
3.0000 mL | Freq: Two times a day (BID) | INTRAVENOUS | Status: DC
  Administered 2017-08-02: 3 mL via INTRAVENOUS

## 2017-08-02 MED ORDER — AMIODARONE HCL 150 MG/3ML IV SOLN
INTRAVENOUS | Status: AC
  Filled 2017-08-02: qty 3

## 2017-08-02 MED ORDER — SODIUM CHLORIDE 0.9 % IV SOLN: 250.0000 mL | INTRAVENOUS | Status: DC | PRN

## 2017-08-02 MED ORDER — LIDOCAINE HCL (PF) 1 % IJ SOLN
INTRAMUSCULAR | Status: DC | PRN
  Administered 2017-08-02: 10 mL

## 2017-08-02 MED ORDER — METOPROLOL SUCCINATE ER 25 MG PO TB24: 25.0000 mg | ORAL_TABLET | Freq: Every day | ORAL | Status: DC

## 2017-08-02 MED ORDER — IOPAMIDOL (ISOVUE-370) INJECTION 76%
INTRAVENOUS | Status: AC
  Filled 2017-08-02: qty 50

## 2017-08-02 MED ORDER — LIDOCAINE HCL (PF) 1 % IJ SOLN
INTRAMUSCULAR | Status: AC
  Filled 2017-08-02: qty 30

## 2017-08-02 MED ORDER — ACETAMINOPHEN 325 MG PO TABS: 650.0000 mg | ORAL_TABLET | ORAL | Status: DC | PRN

## 2017-08-02 MED ORDER — MIDAZOLAM HCL 2 MG/2ML IJ SOLN
INTRAMUSCULAR | Status: DC | PRN
  Administered 2017-08-02: 1 mg via INTRAVENOUS

## 2017-08-02 MED ORDER — METOPROLOL TARTRATE 5 MG/5ML IV SOLN
INTRAVENOUS | Status: DC | PRN
  Administered 2017-08-02: 5 mg via INTRAVENOUS

## 2017-08-02 MED ORDER — FENTANYL CITRATE (PF) 100 MCG/2ML IJ SOLN
INTRAMUSCULAR | Status: DC | PRN
  Administered 2017-08-02: 25 ug via INTRAVENOUS

## 2017-08-02 MED ORDER — ATORVASTATIN CALCIUM 40 MG PO TABS
40.0000 mg | ORAL_TABLET | Freq: Every day | ORAL | Status: DC
  Administered 2017-08-02: 40 mg via ORAL
  Filled 2017-08-02: qty 1

## 2017-08-02 MED ORDER — IOPAMIDOL (ISOVUE-370) INJECTION 76%
INTRAVENOUS | Status: AC
  Filled 2017-08-02: qty 100

## 2017-08-02 MED ORDER — MIDAZOLAM HCL 2 MG/2ML IJ SOLN
INTRAMUSCULAR | Status: AC
  Filled 2017-08-02: qty 2

## 2017-08-02 MED ORDER — ADENOSINE 6 MG/2ML IV SOLN
INTRAVENOUS | Status: AC
  Filled 2017-08-02: qty 4

## 2017-08-02 MED ORDER — HEPARIN (PORCINE) IN NACL 1000-0.9 UT/500ML-% IV SOLN
INTRAVENOUS | Status: AC
  Filled 2017-08-02: qty 1000

## 2017-08-02 MED ORDER — ASPIRIN 81 MG PO CHEW: 81.0000 mg | CHEWABLE_TABLET | ORAL | Status: DC

## 2017-08-02 MED ORDER — APIXABAN 5 MG PO TABS: 5.0000 mg | ORAL_TABLET | Freq: Two times a day (BID) | ORAL | Status: DC

## 2017-08-02 MED ORDER — HEPARIN (PORCINE) IN NACL 2-0.9 UNITS/ML
INTRAMUSCULAR | Status: AC | PRN
  Administered 2017-08-02: 500 mL
  Administered 2017-08-02: 500 mL via INTRA_ARTERIAL

## 2017-08-02 MED ORDER — IOPAMIDOL (ISOVUE-370) INJECTION 76%
INTRAVENOUS | Status: DC | PRN
  Administered 2017-08-02: 120 mL via INTRA_ARTERIAL

## 2017-08-02 MED ORDER — ONDANSETRON HCL 4 MG/2ML IJ SOLN: 4.0000 mg | Freq: Four times a day (QID) | INTRAMUSCULAR | Status: DC | PRN

## 2017-08-02 MED ORDER — FENTANYL CITRATE (PF) 100 MCG/2ML IJ SOLN
INTRAMUSCULAR | Status: AC
  Filled 2017-08-02: qty 2

## 2017-08-02 SURGICAL SUPPLY — 12 items
CATH INFINITI 5FR JL5 (CATHETERS) ×2 IMPLANT
CATH INFINITI 5FR MULTPACK ANG (CATHETERS) ×2 IMPLANT
CATH LAUNCHER 5F EBU4.0 (CATHETERS) ×2 IMPLANT
CATH SWAN GANZ 7F STRAIGHT (CATHETERS) ×2 IMPLANT
ELECT DEFIB PAD ADLT CADENCE (PAD) ×2 IMPLANT
KIT HEART LEFT (KITS) ×2 IMPLANT
PACK CARDIAC CATHETERIZATION (CUSTOM PROCEDURE TRAY) ×2 IMPLANT
SHEATH AVANTI 11CM 5FR (SHEATH) ×2 IMPLANT
SHEATH AVANTI 11CM 7FR (SHEATH) ×2 IMPLANT
TRANSDUCER W/STOPCOCK (MISCELLANEOUS) ×2 IMPLANT
WIRE EMERALD 3MM-J .035X150CM (WIRE) ×2 IMPLANT
WIRE HI TORQ VERSACORE-J 145CM (WIRE) ×2 IMPLANT

## 2017-08-02 NOTE — Progress Notes (Addendum)
Patient ID: [name] [name], male   DOB: 12/12/1965, 52 y.o.   MRN: [id number]     Advanced Heart Failure Rounding Note  PCP-Cardiologist: [name] Sable, MD   Subjective:    No complaints, had HD this morning.  Cath today as below.  Of note, he arrived from HD in SVT in 150s, adenosine failed x 2 but broke to NSR with Lopressor 5 mg IV x 1.   TEE 07/31/17 AV is tri leaflet and severely calcified. ? Rheumatic There is no vegetation seen There is severe AR and mild AS MAC with moderate [name] EF 25-30% diffuse hypokinesis No LAA thormbus Bi Atrial Enlargement  Mild RV enlargement   LHC/RHC (4/17) Diagnostic  Dominance: Right  Left Main  No significant disease.  Left Anterior Descending  40% mid LAD stenosis. Small D2 with 95% ostial stenosis. Small D3 with 70% mid vessel stenosis.  Left Circumflex  Serial 80% ostial and 80% proximal LCx. Large vessel.  Right Coronary Artery  Luminal irregularities.  Intervention   No interventions have been documented.  Right Heart   Right Heart Pressures RHC Procedural Findings: Hemodynamics (mmHg) RA mean 11 RV 51/14 PA 53/24, mean 37 PCWP mean 26 LV 105/32 AO 97/65  Oxygen saturations: PA 61% AO 91%  Cardiac Output (Fick) 7  Cardiac Index (Fick) 2.67  PVR 1.6 WU  Cardiac Output (Thermo) 6.4 Cardiac Index (Thermo) 2.44     Objective:   Weight Range: (!) 320 lb 12.3 oz (145.5 kg) Body mass index is 43.5 kg/m.   Vital Signs:   Temp:  [97.6 F (36.4 C)-98.5 F (36.9 C)] 97.8 F (36.6 C) (04/17 1107) Pulse Rate:  [72-159] 84 (04/17 1111) Resp:  [20-35] 35 (04/17 1111) BP: (86-111)/(54-73) 111/73 (04/17 1107) SpO2:  [91 %-97 %] 91 % (04/17 1216) Weight:  [320 lb 12.3 oz (145.5 kg)-324 lb 3.2 oz (147.1 kg)] 320 lb 12.3 oz (145.5 kg) (04/17 1030) Last BM Date: 07/31/17  Weight change: Filed Weights   08/02/17 0503 08/02/17 0710 08/02/17 1030  Weight: (!) 324 lb 3.2 oz (147.1 kg) (!) 324 lb 1.2 oz (147 kg) (!) 320 lb  12.3 oz (145.5 kg)   Intake/Output:   Intake/Output Summary (Last 24 hours) at 08/02/2017 1328 Last data filed at 08/02/2017 1114 Gross per 24 hour  Intake 343 ml  Output 1000 ml  Net -657 ml    Physical Exam    General: NAD Neck: JVP 10-12 cm, no thyromegaly or thyroid nodule.  Lungs: Clear to auscultation bilaterally with normal respiratory effort. CV: Nondisplaced PMI.  Heart regular S1/S2, no S9/G2, 1/6 diastolic murmur along sternal border, 1/6 HSM apex.  Trace ankle edema.   Abdomen: Soft, nontender, no hepatosplenomegaly, no distention.  Skin: Intact without lesions or rashes.  Neurologic: Alert and oriented x 3.  Psych: Normal affect. Extremities: No clubbing or cyanosis.  HEENT: Normal.    Telemetry   NSR 90s currently, had run SVT 150s.  Personally reviewed.   EKG    No new tracings.    Labs    CBC Recent Labs    07/31/17 1851 08/02/17 0251  WBC 8.9 7.4  NEUTROABS  --  4.9  HGB 12.7* 12.2*  HCT 39.9 39.4  MCV 81.1 82.1  PLT 283 836   Basic Metabolic Panel Recent Labs    08/01/17 0403 08/02/17 0251  NA 136 136  K 3.8 4.3  CL 97* 97*  CO2 25 24  GLUCOSE 89 83  BUN 46* 60*  CREATININE 10.55* 12.68*  CALCIUM 8.5* 8.6*  MG  --  2.1  PHOS  --  6.4*   Liver Function Tests Recent Labs    08/01/17 0403 08/02/17 0251  AST 14* 11*  ALT 9* 9*  ALKPHOS 111 103  BILITOT 0.9 0.7  PROT 7.1 6.9  ALBUMIN 2.9* 2.9*   No results for input(s): LIPASE, AMYLASE in the last 72 hours. Cardiac Enzymes No results for input(s): CKTOTAL, CKMB, CKMBINDEX, TROPONINI in the last 72 hours.  BNP: BNP (last 3 results) Recent Labs    07/28/17 1831  BNP 1,098.0*    ProBNP (last 3 results) No results for input(s): PROBNP in the last 8760 hours.   D-Dimer No results for input(s): DDIMER in the last 72 hours. Hemoglobin A1C No results for input(s): HGBA1C in the last 72 hours. Fasting Lipid Panel No results for input(s): CHOL, HDL, LDLCALC, TRIG,  CHOLHDL, LDLDIRECT in the last 72 hours. Thyroid Function Tests No results for input(s): TSH, T4TOTAL, T3FREE, THYROIDAB in the last 72 hours.  Invalid input(s): FREET3  Other results:   Imaging    Dg Orthopantogram  Result Date: 08/01/2017 CLINICAL DATA:  Port intention. EXAM: ORTHOPANTOGRAM/PANORAMIC COMPARISON:  No recent prior. FINDINGS: Small periapical lucency noted about the remaining left lower molar. Severe multifocal dental caries. Mandible otherwise appears to be intact. If a mandibular abscess is suspected maxillofacial CT can be obtained. IMPRESSION: Small periapical lucency noted about the remaining left lower molar. Severe multifocal dental caries. Electronically Signed   By: Marcello Moores  Register   On: 08/01/2017 16:27     Medications:     Scheduled Medications: . atorvastatin  40 mg Oral q1800  . calcium acetate  1,334 mg Oral TID WC  . Chlorhexidine Gluconate Cloth  6 each Topical Q0600  . cinacalcet  90 mg Oral Q breakfast  . ivabradine  5 mg Oral BID WC  . lanthanum  2,000 mg Oral TID WC  . [START ON 08/03/2017] metoprolol succinate  25 mg Oral Daily  . midodrine  5 mg Oral Q T,Th,Sa-HD  . multivitamin  1 tablet Oral QHS  . mupirocin ointment  1 application Nasal BID  . sodium chloride flush  3 mL Intravenous Q12H    Infusions: . sodium chloride    . heparin      PRN Medications: sodium chloride, acetaminophen, ALPRAZolam, benzonatate, calcium acetate, fentaNYL, heparin, iopamidol, lanthanum, lidocaine (PF), ondansetron (ZOFRAN) IV, sodium chloride flush, zolpidem    Patient Profile   [name] [name] is a 52 year old with history of ESRD TTS [place], smoker, sinus tach, AV graft thrombosis, and morbid obesity.   Newly reduced EF and vegetation on LVOT.   Assessment/Plan   1. 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  No  active vegetation seen.  Blood cultures negative.  Severe AI could certainly potentiate CHF. This was not see on 2012 echo.  The LV is not significantly dilated, so not sure AI is the only explanation for his cardiomyopathy.  - He has been seen by TCTS, will need aortic valve replacement and possible MV repair.  Will need bypass of LCx system. Will discuss timing with Dr. Roxy Manns.  2. Acute on ?chronic systolic CHF:  Echo with EF 20-25%.  As LV is not markedly dilated, I am not sure that aortic insufficiency can be invoked as the only cause for cardiomyopathy.  He has chronic  sinus tachycardia x years but HR has been in 100s-110s, do not think this would have been likely to cause a tachy-mediated CMP.  He has serial 80% stenoses in the LCx, but do not think this can explain all of the cardiomyopathy either.  It is possible that CMP is multifactorial.  He remains volume overloaded by RHC even after HD today.  Would recommend HD again tomorrow with more fluid off.  - Continue Corlanor 5 mg bid and with SVT, increase Toprol XL to 25 mg daily.  - No BP room to add other HF meds, he actually requires midodrine with HD.  3. ESRD: Still volume overloaded, would favor HD again tomorrow.  4. Suspected OSA: Needs sleep study.  5. SVT: In 150s, do not think it was flutter.  Broke with Lopressor.  Increasing Toprol XL to 25 mg daily as above.   6. CAD: See cath above. No ACS.  Will start statin.  He will be on Eliquis because of dialysis access issues so will not start ASA. Restart Eliquis in am. Will need SVG-OM with valve surgery.   Loralie Champagne 08/02/2017 1:28 PM

## 2017-08-02 NOTE — Interval H&P Note (Signed)
History and Physical Interval Note:  08/02/2017 12:05 PM  [name] [name]  has presented today for surgery, with the diagnosis of hf  The various methods of treatment have been discussed with the patient and family. After consideration of risks, benefits and other options for treatment, the patient has consented to  Procedure(s): RIGHT/LEFT HEART CATH AND CORONARY ANGIOGRAPHY (N/A) as a surgical intervention .  The patient's history has been reviewed, patient examined, no change in status, stable for surgery.  I have reviewed the patient's chart and labs.  Questions were answered to the patient's satisfaction.     [name] [employer]

## 2017-08-02 NOTE — Progress Notes (Signed)
Hemodialysis was called at [facility] and asked when pt would be taken for HD. The dialysis representative said he would go first thing in the AM.  HD was unable to take him tonight for emergencies and staffing. Pt has been informed.  Will continue to monitor.  Lupita Dawn, RN

## 2017-08-02 NOTE — Procedures (Signed)
Patient seen and examined on Hemodialysis. QB 400 mL/ min L RC fistula UF goal 1.5 net  Treatment adjusted as needed.  Madelon Lips MD [facility] pgr [phone] 8:51 AM

## 2017-08-02 NOTE — Progress Notes (Signed)
DENTAL CONSULTATION  Date of Consultation:  08/02/2017 Patient Name:   [name] [name] Date of Birth:   1966/02/20 Medical Record Number: [id number]  VITALS: BP 121/75   Pulse (!) 0   Temp 97.8 F (36.6 C) (Oral)   Resp (!) 137   Ht 6' (1.829 m)   Wt (!) 320 lb 12.3 oz (145.5 kg)   SpO2 (!) 0%   BMI 43.50 kg/m   CHIEF COMPLAINT: Patient referred by Dr. Roxy Manns for dental consultation.  HPI: [name] [name] is a 52 year old male recently diagnosed with severe aortic insufficiency and mitral regurgitation.  Patient with anticipated aortic valve replacement or repair and possible mitral valve replacement or repair.  Patient has not seen as part of pre-heart valve surgery dental protocol examination.  Patient currently denies acute toothaches, swelling, or abscesses.  Patient indicates he has not seen a dentist since he was in elementary school.  Patient denies having any partial dentures.  Patient denies having dental phobia.  PROBLEM LIST: Patient Active Problem List   Diagnosis Date Noted  . Mitral regurgitation   . Severe aortic insufficiency   . Acute on chronic combined systolic and diastolic heart failure ([place])   . Systolic CHF ([place]) 82/42/3536  . 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 Dermatitis 04/06/2016  . End stage renal disease ([place]) 12/22/2011  . Sinus tachycardia   . ESRD (end stage renal disease) ([place])   . Orthostatic hypotension   . Chest discomfort   . Morbid obesity ([place])   . Syncope   . Aneurysm ([place])     PMH: Past Medical History:  Diagnosis Date  . Aneurysm ([place])     Right arm fistula 3 aneurysms 2011,   plans to have a new procedure  . Angina   . Chest discomfort   . CHF (congestive heart failure) ([place])   . Dialysis patient ([place])   . Dysrhythmia   . ESRD (end stage renal disease) ([place])    on hemodialysis T_T_S  . Hypertension   . Leg pain   . Mitral regurgitation   . Morbid obesity ([place])   . Orthostatic hypotension   .  Overweight(278.02)   . Severe aortic insufficiency   . Sinus tachycardia   . Syncope     positional after dialysis... 2008    PSH: Past Surgical History:  Procedure Laterality Date  . A/V FISTULAGRAM Left 06/30/2016   Procedure: A/V Fistulagram;  Surgeon: Algernon Huxley, MD;  Location: [facility];  Service: Cardiovascular;  Laterality: Left;  . ARTERIOVENOUS GRAFT PLACEMENT    . AV FISTULA PLACEMENT    . AV FISTULA PLACEMENT Left 11/18/2015   Procedure: ARTERIOVENOUS (AV) FISTULA CREATION ( RADIOCEPHALIC );  Surgeon: Algernon Huxley, MD;  Location: [facility];  Service: Vascular;  Laterality: Left;  . AV FISTULA PLACEMENT Left 03/23/2016   Procedure: ARTERIOVENOUS (AV) FISTULA CREATION ( REVISION );  Surgeon: Algernon Huxley, MD;  Location: [facility];  Service: Vascular;  Laterality: Left;  . [place] REMOVAL Left 03/23/2016   Procedure: REMOVAL OF ARTERIOVENOUS GORETEX GRAFT ([place]);  Surgeon: Algernon Huxley, MD;  Location: [facility];  Service: Vascular;  Laterality: Left;  . DIALYSIS/PERMA CATHETER REMOVAL N/A 08/01/2016   Procedure: Dialysis/Perma Catheter Removal;  Surgeon: Algernon Huxley, MD;  Location: [place] [facility];  Service: Cardiovascular;  Laterality: N/A;  . HERNIA REPAIR    . INSERTION OF DIALYSIS CATHETER  03/01/2011   Procedure: INSERTION OF DIALYSIS CATHETER;  Surgeon: Elam Dutch, MD;  Location: [facility];  Service: Vascular;  Laterality: Left;  Exchange of Dialysis Catheter to 27cm 15Fr. Arrow Catheter  . PERIPHERAL VASCULAR CATHETERIZATION N/A 09/01/2014   Procedure: A/V Shuntogram/Fistulagram;  Surgeon: Algernon Huxley, MD;  Location: [place] [facility];  Service: Cardiovascular;  Laterality: N/A;  . PERIPHERAL VASCULAR CATHETERIZATION Right 09/01/2014   Procedure: Thrombectomy;  Surgeon: Algernon Huxley, MD;  Location: [place] [facility];  Service: Cardiovascular;  Laterality: Right;  . PERIPHERAL VASCULAR CATHETERIZATION Right 09/01/2014   Procedure: A/V Shunt Intervention;  Surgeon:  Algernon Huxley, MD;  Location: [place] [facility];  Service: Cardiovascular;  Laterality: Right;  . PERIPHERAL VASCULAR CATHETERIZATION N/A 09/17/2014   Procedure: A/V Shuntogram/Fistulagram;  Surgeon: Algernon Huxley, MD;  Location: [facility] [facility];  Service: Cardiovascular;  Laterality: N/A;  . PERIPHERAL VASCULAR CATHETERIZATION Right 10/17/2014   Procedure: Thrombectomy;  Surgeon: Katha Cabal, MD;  Location: [place] [facility] [facility];  Service: Cardiovascular;  Laterality: Right;  . PERIPHERAL VASCULAR CATHETERIZATION N/A 10/17/2014   Procedure: A/V Shuntogram/Fistulagram;  Surgeon: Katha Cabal, MD;  Location: [place] [facility];  Service: Cardiovascular;  Laterality: N/A;  . PERIPHERAL VASCULAR CATHETERIZATION N/A 10/17/2014   Procedure: A/V Shunt Intervention;  Surgeon: Katha Cabal, MD;  Location: [place] [facility];  Service: Cardiovascular;  Laterality: N/A;  . PERIPHERAL VASCULAR CATHETERIZATION Right 11/04/2014   Procedure: Thrombectomy;  Surgeon: Katha Cabal, MD;  Location: [place] [facility];  Service: Cardiovascular;  Laterality: Right;  . PERIPHERAL VASCULAR CATHETERIZATION Left 11/04/2014   Procedure: Visceral Venography;  Surgeon: Katha Cabal, MD;  Location: [place] [facility];  Service: Cardiovascular;  Laterality: Left;  . PERIPHERAL VASCULAR CATHETERIZATION Right 11/27/2014   Procedure: A/V Shuntogram/Fistulagram;  Surgeon: Algernon Huxley, MD;  Location: [place] [facility];  Service: Cardiovascular;  Laterality: Right;  . PERIPHERAL VASCULAR CATHETERIZATION Right 11/27/2014   Procedure: A/V Shunt Intervention;  Surgeon: Algernon Huxley, MD;  Location: [place] [facility];  Service: Cardiovascular;  Laterality: Right;  . PERIPHERAL VASCULAR CATHETERIZATION Right 12/31/2014   Procedure: Thrombectomy;  Surgeon: Algernon Huxley, MD;  Location: [place] [facility];  Service: Cardiovascular;  Laterality: Right;  . PERIPHERAL VASCULAR CATHETERIZATION Right 01/12/2015    Procedure: A/V Shuntogram/Fistulagram;  Surgeon: Algernon Huxley, MD;  Location: [place] [facility];  Service: Cardiovascular;  Laterality: Right;  . PERIPHERAL VASCULAR CATHETERIZATION N/A 01/12/2015   Procedure: A/V Shunt Intervention;  Surgeon: Algernon Huxley, MD;  Location: [place] [facility];  Service: Cardiovascular;  Laterality: N/A;  . PERIPHERAL VASCULAR CATHETERIZATION Right 01/28/2015   Procedure: Thrombectomy;  Surgeon: Algernon Huxley, MD;  Location: [place] [facility];  Service: Cardiovascular;  Laterality: Right;  . PERIPHERAL VASCULAR CATHETERIZATION N/A 06/08/2015   Procedure: A/V Shuntogram/Fistulagram;  Surgeon: Algernon Huxley, MD;  Location: [place] [facility];  Service: Cardiovascular;  Laterality: N/A;  . PERIPHERAL VASCULAR CATHETERIZATION N/A 06/08/2015   Procedure: A/V Shunt Intervention;  Surgeon: Algernon Huxley, MD;  Location: [place] [facility];  Service: Cardiovascular;  Laterality: N/A;  . PERIPHERAL VASCULAR CATHETERIZATION Right 07/06/2015   Procedure: A/V Shuntogram/Fistulagram;  Surgeon: Algernon Huxley, MD;  Location: [place] [facility];  Service: Cardiovascular;  Laterality: Right;  . PERIPHERAL VASCULAR CATHETERIZATION N/A 07/06/2015   Procedure: A/V Shunt Intervention;  Surgeon: Algernon Huxley, MD;  Location: [place] [facility];  Service: Cardiovascular;  Laterality: N/A;  .  PERIPHERAL VASCULAR CATHETERIZATION N/A 08/04/2015   Procedure: graft declot;  Surgeon: Katha Cabal, MD;  Location: [facility] [facility];  Service: Cardiovascular;  Laterality: N/A;  . PERIPHERAL VASCULAR CATHETERIZATION Right 08/25/2015   Procedure: A/V Shuntogram/Fistulagram;  Surgeon: Katha Cabal, MD;  Location: [place] [facility];  Service: Cardiovascular;  Laterality: Right;  . PERIPHERAL VASCULAR CATHETERIZATION N/A 11/04/2015   Procedure: Dialysis/Perma Catheter Insertion;  Surgeon: Algernon Huxley, MD;  Location: [place] [facility];  Service: Cardiovascular;  Laterality: N/A;  .  PERIPHERAL VASCULAR CATHETERIZATION Left 12/14/2015   Procedure: A/V Shuntogram/Fistulagram;  Surgeon: Algernon Huxley, MD;  Location: [place] [facility];  Service: Cardiovascular;  Laterality: Left;  . PERIPHERAL VASCULAR CATHETERIZATION N/A 12/14/2015   Procedure: A/V Shunt Intervention;  Surgeon: Algernon Huxley, MD;  Location: [place] [facility];  Service: Cardiovascular;  Laterality: N/A;  . PERIPHERAL VASCULAR CATHETERIZATION N/A 12/17/2015   Procedure: Dialysis/Perma Catheter Insertion;  Surgeon: Algernon Huxley, MD;  Location: [place] [facility];  Service: Cardiovascular;  Laterality: N/A;  . PERIPHERAL VASCULAR CATHETERIZATION Left 12/22/2015   Procedure: Dialysis/Perma Catheter Insertion;  Surgeon: Katha Cabal, MD;  Location: [place] [facility];  Service: Cardiovascular;  Laterality: Left;  . PERIPHERAL VASCULAR CATHETERIZATION Left 02/29/2016   Procedure: A/V Shuntogram/Fistulagram;  Surgeon: Algernon Huxley, MD;  Location: [place] [facility];  Service: Cardiovascular;  Laterality: Left;  . PERIPHERAL VASCULAR CATHETERIZATION N/A 02/29/2016   Procedure: A/V Shunt Intervention;  Surgeon: Algernon Huxley, MD;  Location: [place] [facility];  Service: Cardiovascular;  Laterality: N/A;  . right arm graft     for dyalisis  . RIGHT/LEFT HEART CATH AND CORONARY ANGIOGRAPHY N/A 08/02/2017   Procedure: RIGHT/LEFT HEART CATH AND CORONARY ANGIOGRAPHY;  Surgeon: Larey Dresser, MD;  Location: [place] [facility];  Service: Cardiovascular;  Laterality: N/A;  . TEE WITHOUT CARDIOVERSION N/A 07/31/2017   Procedure: TRANSESOPHAGEAL ECHOCARDIOGRAM (TEE);  Surgeon: Josue Hector, MD;  Location: [facility];  Service: Cardiovascular;  Laterality: N/A;  . THROMBECTOMY    . THROMBECTOMY W/ EMBOLECTOMY  03/01/2011   Procedure: THROMBECTOMY ARTERIOVENOUS GORE-TEX GRAFT;  Surgeon: Elam Dutch, MD;  Location: [place];  Service: Vascular;  Laterality: Right;  Attempted Thrombectomy of Old  Right Upper Arm  Arteriovenous gortex Graft. Insertion of new Arteriovenous Graft using 68mm x 50cm Gortex Stretch graft.   . THROMBECTOMY W/ EMBOLECTOMY  07/11/2011   Procedure: THROMBECTOMY ARTERIOVENOUS GORE-TEX GRAFT;  Surgeon: Rosetta Posner, MD;  Location: [place];  Service: Vascular;  Laterality: Right;  . UMBILICAL HERNIA REPAIR    . VENOGRAM N/A 08/23/2011   Procedure: VENOGRAM;  Surgeon: [name], MD;  [facility];  Service: Cardiovascular;  Laterality: N/A;    ALLERGIES: No Known Allergies  MEDICATIONS: Current Facility-Administered Medications  Medication Dose Route Frequency Provider Last Rate Last Dose  . 0.9 %  sodium chloride infusion  250 mL Intravenous PRN Nita Sells, MD      . 0.9 %  sodium chloride infusion  250 mL Intravenous PRN Larey Dresser, MD      . acetaminophen (TYLENOL) tablet 650 mg  650 mg Oral Q4H PRN Nita Sells, MD      . acetaminophen (TYLENOL) tablet 650 mg  650 mg Oral Q4H PRN Larey Dresser, MD      . ALPRAZolam Duanne Moron) tablet 0.25 mg  0.25 mg Oral BID PRN Nita Sells, MD      . [  START ON 08/03/2017] apixaban (ELIQUIS) tablet 5 mg  5 mg Oral BID Larey Dresser, MD      . Derrill Memo ON 08/03/2017] apixaban (ELIQUIS) tablet 5 mg  5 mg Oral BID Larey Dresser, MD      . atorvastatin (LIPITOR) tablet 40 mg  40 mg Oral q1800 Larey Dresser, MD      . benzonatate (TESSALON) capsule 200 mg  200 mg Oral TID PRN Nita Sells, MD      . calcium acetate (PHOSLO) capsule 1,334 mg  1,334 mg Oral TID WC Nita Sells, MD   1,334 mg at 08/01/17 1725  . calcium acetate (PHOSLO) capsule 667 mg  667 mg Oral PRN Nita Sells, MD      . Chlorhexidine Gluconate Cloth 2 % PADS 6 each  6 each Topical Q0600 Nita Sells, MD   6 each at 08/02/17 0520  . cinacalcet (SENSIPAR) tablet 90 mg  90 mg Oral Q breakfast Nita Sells, MD   90 mg at 08/01/17 0842  . ivabradine (CORLANOR) tablet 5 mg  5 mg Oral BID WC  Nita Sells, MD   5 mg at 08/01/17 1725  . lanthanum (FOSRENOL) chewable tablet 1,000 mg  1,000 mg Oral BID BM & HS PRN Nita Sells, MD   1,000 mg at 07/29/17 1708  . lanthanum (FOSRENOL) chewable tablet 2,000 mg  2,000 mg Oral TID WC Nita Sells, MD   2,000 mg at 08/01/17 1725  . [START ON 08/03/2017] metoprolol succinate (TOPROL-XL) 24 hr tablet 25 mg  25 mg Oral Daily Larey Dresser, MD      . midodrine (PROAMATINE) tablet 5 mg  5 mg Oral Q T,Th,Sa-HD Nita Sells, MD   5 mg at 08/02/17 0720  . multivitamin (RENA-VIT) tablet 1 tablet  1 tablet Oral QHS Nita Sells, MD   1 tablet at 08/01/17 2100  . mupirocin ointment (BACTROBAN) 2 % 1 application  1 application Nasal BID Nita Sells, MD   1 application at 16/10/96 1112  . ondansetron (ZOFRAN) injection 4 mg  4 mg Intravenous Q6H PRN Nita Sells, MD      . ondansetron (ZOFRAN) injection 4 mg  4 mg Intravenous Q6H PRN Larey Dresser, MD      . sodium chloride flush (NS) 0.9 % injection 3 mL  3 mL Intravenous Q12H Nita Sells, MD   3 mL at 08/02/17 1117  . sodium chloride flush (NS) 0.9 % injection 3 mL  3 mL Intravenous PRN Nita Sells, MD   3 mL at 08/01/17 0041  . sodium chloride flush (NS) 0.9 % injection 3 mL  3 mL Intravenous Q12H Larey Dresser, MD      . sodium chloride flush (NS) 0.9 % injection 3 mL  3 mL Intravenous PRN Larey Dresser, MD      . zolpidem (AMBIEN) tablet 5 mg  5 mg Oral QHS PRN,MR X 1 Nita Sells, MD        LABS: Lab Results  Component Value Date   WBC 7.4 08/02/2017   HGB 12.2 (L) 08/02/2017   HCT 39.4 08/02/2017   MCV 82.1 08/02/2017   PLT 225 08/02/2017      Component Value Date/Time   NA 136 08/02/2017 0251   NA 137 11/13/2013 0408   K 4.3 08/02/2017 0251   K 4.1 11/13/2013 0408   CL 97 (L) 08/02/2017 0251   CL 98 11/13/2013 0408   CO2 24 08/02/2017 0251   CO2 25  11/13/2013 0408   GLUCOSE 83 08/02/2017 0251    GLUCOSE 64 (L) 11/13/2013 0408   BUN 60 (H) 08/02/2017 0251   BUN 68 (H) 11/13/2013 0408   CREATININE 12.68 (H) 08/02/2017 0251   CREATININE 16.45 (H) 11/13/2013 0408   CALCIUM 8.6 (L) 08/02/2017 0251   CALCIUM 6.8 (LL) 11/13/2013 0408   GFRNONAA 4 (L) 08/02/2017 0251   GFRNONAA 3 (L) 11/13/2013 0408   GFRAA 5 (L) 08/02/2017 0251   GFRAA 3 (L) 11/13/2013 0408   Lab Results  Component Value Date   INR 1.38 08/01/2017   INR 1.26 03/16/2016   INR 1.25 11/13/2015   No results found for: PTT  SOCIAL HISTORY: Social History   Socioeconomic History  . Marital status: Married    Spouse name: Not on file  . Number of children: Not on file  . Years of education: Not on file  . Highest education level: Not on file  Occupational History  . Occupation: disabled  Social Needs  . Financial resource strain: Not on file  . Food insecurity:    Worry: Not on file    Inability: Not on file  . Transportation needs:    Medical: Not on file    Non-medical: Not on file  Tobacco Use  . Smoking status: Former Smoker    Types: Cigarettes    Last attempt to quit: 04/18/1994    Years since quitting: 23.3  . Smokeless tobacco: Current User    Types: Snuff  . Tobacco comment: dips 1/2 snuff per day times 25 years  Substance and Sexual Activity  . Alcohol use: No  . Drug use: No  . Sexual activity: Not on file  Lifestyle  . Physical activity:    Days per week: Not on file    Minutes per session: Not on file  . Stress: Not on file  Relationships  . Social connections:    Talks on phone: Not on file    Gets together: Not on file    Attends religious service: Not on file    Active member of club or organization: Not on file    Attends meetings of clubs or organizations: Not on file    Relationship status: Not on file  . Intimate partner violence:    Fear of current or ex partner: Not on file    Emotionally abused: Not on file    Physically abused: Not on file    Forced sexual  activity: Not on file  Other Topics Concern  . Not on file  Social History Narrative  . Not on file    FAMILY HISTORY: Family History  Problem Relation Age of Onset  . Hypertension Mother   . Heart disease Mother   . Hypertension Father   . Heart disease Father     REVIEW OF SYSTEMS: Reviewed with the patient as per History of present illness. Psych: Patient denies having dental phobia.  DENTAL HISTORY: CHIEF COMPLAINT: Patient referred by Dr. Roxy Manns for dental consultation.  HPI: [name] [name] is a 52 year old male recently diagnosed with severe aortic insufficiency and mitral regurgitation.  Patient with anticipated aortic valve replacement or repair and possible mitral valve replacement or repair.  Patient has not seen as part of pre-heart valve surgery dental protocol examination.  Patient currently denies acute toothaches, swelling, or abscesses.  Patient indicates he has not seen a dentist since he was in elementary school.  Patient denies having any partial dentures.  Patient denies having dental phobia.  DENTAL EXAMINATION: GENERAL: The patient is a well-developed, obese male in no acute distress. HEAD AND NECK: There is no palpable neck lymphadenopathy.  The patient denies acute TMJ symptoms. INTRAORAL EXAM: Patient has normal saliva.  There is no evidence of oral abscess formation. DENTITION: Patient with multiple missing teeth.  Patient with multiple retained root segments. PERIODONTAL: Patient has chronic periodontitis with plaque and calculus accumulations, gingival recession, and incipient to moderate bone loss. DENTAL CARIES/SUBOPTIMAL RESTORATIONS: Multiple dental caries are noted. ENDODONTIC: The patient currently denies acute pulpitis symptoms.  Patient does appear to have periapical pathology associated with the retained root segments. CROWN AND BRIDGE: There are no crown or bridge restorations. PROSTHODONTIC: Patient denies having partial dentures. OCCLUSION:  Patient has a poor occlusal scheme secondary to multiple missing teeth, multiple retained root segments, and lack replacement of missing teeth with dental prostheses.  RADIOGRAPHIC INTERPRETATION: A suboptimal orthopantogram was obtained on 08/01/2017.  A retake of the orthopantogram was requested but has not been taken due to the patient's significant schedule for hemodialysis and catheterization. There are multiple missing teeth.  There are multiple retained root segments.  There is moderate horizontal vertical bone loss.  Dental caries are noted.  There is supraeruption and drifting of the unopposed teeth into the edentulous areas.   ASSESSMENTS: 1.  Severe aortic insufficiency and moderate mitral regurgitation 2.  Pre-heart valve surgery dental protocol 3.  Chronic apical periodontitis 4.  Retained root segments 5.  Dental caries 6.  Chronic periodontitis of bone loss 7.  Accretions 8.  Gingival recession 9.  Tooth mobility 10.  Multiple missing teeth 11.  Super eruption drifting the unopposed teeth into the edentulous areas 12.  Poor occlusal scheme and malocclusion 13.  Risk of bleeding with invasive dental procedures due to current Eliquis therapy   PLAN/RECOMMENDATIONS: 1. I discussed the risks, benefits, and complications of various treatment options with the patient in relationship to his medical and dental conditions, anticipated heart valve surgery, and risk for endocarditis. We discussed various treatment options to include no treatment, multiple extractions with alveoloplasty, pre-prosthetic surgery as indicated, periodontal therapy, dental restorations, root canal therapy, crown and bridge therapy, implant therapy, and replacement of missing teeth as indicated. The patient currently wishes to proceed with multiple extractions with alveoloplasty and gross debridement remaining dentition in the operating with general anesthesia.  Ideally a repeat of the orthopantogram will be done  in the morning to provide additional diagnostic information before proceeding with dental operating room procedure.  If the patient going to be discharged soon, the patient can proceed with further evaluation as an outpatient with subsequent scheduling of the dental operating procedure with general anesthesia as an outpatient.  The Eliquis therapy will be discontinued 2-3 days prior to invasive dental procedures and coordinated with his hemodialysis schedule.  I will discuss this further with cardiology and TCTS team as indicated.   2. Discussion of findings with medical team and coordination of future medical and dental care as needed.    [name], DDS

## 2017-08-02 NOTE — Progress Notes (Signed)
  [facility] Progress Note   Assessment/ Plan:    1  ESRD - on HD TTS, HD Monday and today--> rolled over from yesterday.  Midodrine before HD for BP support--> in light of new TEE findings I think that AR and reduced LVEF likely cause of low BP on dialysis.  2.  Severe AR and mod MR: CV surg c/s, for [facility].  Per heart failure and cardiac surgery  3. 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  HD rolled over from yesterday   Objective:   BP 92/63   Pulse 80   Temp 97.6 F (36.4 C) (Oral)   Resp (!) 22   Ht 6' (1.829 m)   Wt (!) 147 kg (324 lb 1.2 oz)   SpO2 96%   BMI 43.95 kg/m   Physical Exam: Gen: sitting in chair, NAD CVS: RRR, systolic and diastolic murmur present Resp: clear bilaterally Abd: obese Ext: trace LE edema ACCESS: L RC AVF  Labs: BMET Recent Labs  Lab 07/28/17 1830 07/30/17 0436 07/31/17 0301 08/01/17 0403 08/02/17 0251  NA 141 138 140 136 136  K 4.2 5.1 5.5* 3.8 4.3  CL 100* 100* 100* 97* 97*  CO2 25 22 22 25 24   GLUCOSE 101* 82 67 89 83  BUN 45* 63* 78* 46* 60*  CREATININE 10.46* 12.54* 14.77* 10.55* 12.68*  CALCIUM 9.2 9.2 9.1 8.5* 8.6*  PHOS  --   --   --   --  6.4*   CBC Recent Labs  Lab 07/28/17 1830 07/31/17 1851 08/02/17 0251  WBC 9.7 8.9 7.4  NEUTROABS  --   --  4.9  HGB 13.2 12.7* 12.2*  HCT 41.7 39.9 39.4  MCV 81.6 81.1 82.1  PLT 283 283 225    @IMGRELPRIORS @ Medications:    . [START ON 08/03/2017] aspirin  81 mg Oral Pre-Cath  . calcium acetate  1,334 mg Oral TID WC  . Chlorhexidine Gluconate Cloth  6 each Topical Q0600  . cinacalcet  90 mg Oral Q breakfast  . ivabradine  5 mg Oral BID WC  . lanthanum  2,000 mg Oral TID WC  . metoprolol succinate  12.5 mg Oral Daily  . midodrine  5 mg Oral Q T,Th,Sa-HD  . multivitamin  1 tablet Oral QHS  . mupirocin ointment  1 application  Nasal BID  . sodium chloride flush  3 mL Intravenous Q12H  . sodium chloride flush  3 mL Intravenous Q12H     Madelon Lips, MD [facility] pgr [phone] 08/02/2017, 8:47 AM

## 2017-08-02 NOTE — Progress Notes (Signed)
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 Recent ED visit [facility] ?virus Admit to [facility] 4/13 shortness of breath-outpatient echocardiogram done at [facility] day prior to admission showed new systolic EF 54-65% globular calcified density LV outflow tract Never drug use  Admission troponin normal BNP 1098 sinus tach 108 blood cultures performed in ED Was sob with positional changes=platypneoa which was insidious but steadily worsening     Assessment & Plan:   Principal Problem:   Acute on chronic combined systolic and diastolic heart failure (HCC) Active Problems:   ESRD (end stage renal disease) (HCC)   Morbid obesity (HCC)   End stage renal disease (HCC)   Systolic CHF (HCC)   Severe aortic insufficiency   Mitral regurgitation  #1 acute on chronic combined systolic and diastolic CHF/severe aortic valvular insufficiency/moderate mitral regurgitation With some clinical improvement post hemodialysis.  Patient underwent cardiac catheterization this morning 08/02/2017 showing 40% mid LAD stenosis, small D2 with 95% ostial stenosis, small D3 with 70% mid vessel stenosis.  Left circumflex with serial 80% ostial and 80% proximal left circumflex.  Large vessel.  Luminal irregularities in the right coronary artery.  TEE done 07/31/2017 with trileaflet aortic valve that was severely calcified with severe AR and mild left ear.  EF 25-30% with diffuse hypokinesis.  No LAA thrombus.  Per cardiology patient noted to be volume overloaded on right heart cath post hemodialysis and may need hemodialysis again tomorrow.  CVTs has has been consulted.  Continue Corlanor 5 mg twice daily.  Toprol-XL has been  increased to 25 mg daily secondary to SVT.  Per cardiology.  2.  SVT Broken to normal sinus rhythm post 5 mg IV Lopressor.  Toprol-XL dose has been increased to 25 mg daily.  3.  End-stage renal disease On hemodialysis.  Per nephrology.  Per right heart cath patient still volume overloaded and may need hemodialysis tomorrow per cardiology recommendations.  4.  Obesity hypoventilation syndrome Likely needs sleep evaluation in the outpatient setting.  5.  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  6.  Morbid obesity Continued outpatient follow-up with his nephrologist who has been guiding his weight loss efforts in the past.   DVT prophylaxis:eliquis Code Status: Full Family Communication: Updated patient and family at bedside. Disposition Plan: Home pending evaluation by cardiothoracic surgery and per cardiology.   Consultants:   Nephrology: Dr.Schertz 07/30/2017  Cardiology: Dr. Aundra Dubin 07/31/2017  Cardiothoracic surgery Dr. Roxy Manns 08/01/2017  History: Dr. Enrique Sack 08/02/2017  Procedures:   Chest x-ray 07/30/2017, 07/28/2017  Orthopantogram 08/01/2017  2D echo 07/28/2017  Transesophageal echocardiogram 07/31/2017  Catheterization 08/02/2017 per Dr. Aundra Dubin  Antimicrobials:   None   Subjective: Returning from cardiac catheterization.  Patient states shortness of breath has improved since hemodialysis this morning.  Denies any chest pain.  Per cardiology patient noted to be in SVT when he arrived to the cardiac cath with heart rate in the 150s, adenosine given x2 which showed blood and patient went into normal sinus rhythm after 5 mg of IV Lopressor.  Objective: Vitals:   08/02/17 1335 08/02/17 1340 08/02/17 1345 08/02/17 1349  BP: 118/80 111/72 121/75   Pulse: 93 95 93 (!) 0  Resp: (!) 34 (!)  27 (!) 27 (!) 137  Temp:      TempSrc:      SpO2: (!) 87% 94% 91% (!) 0%  Weight:      Height:        Intake/Output Summary (Last 24 hours) at  08/02/2017 1732 Last data filed at 08/02/2017 1600 Gross per 24 hour  Intake 103 ml  Output 1000 ml  Net -897 ml   Filed Weights   08/02/17 0503 08/02/17 0710 08/02/17 1030  Weight: (!) 147.1 kg (324 lb 3.2 oz) (!) 147 kg (324 lb 1.2 oz) (!) 145.5 kg (320 lb 12.3 oz)    Examination:  General exam: Appears calm and comfortable. Respiratory system: Clear to auscultation anterior lung fields. Respiratory effort normal. Cardiovascular system: S1 & S2 heard, RRR. No JVD, murmurs, rubs, gallops or clicks.  Trace lower extremity edema. Gastrointestinal system: Abdomen is nondistended, soft and nontender. No organomegaly or masses felt. Normal bowel sounds heard. Central nervous system: Alert and oriented. No focal neurological deficits. Extremities: Symmetric 5 x 5 power. Skin: No rashes, lesions or ulcers Psychiatry: Judgement and insight appear normal. Mood & affect appropriate.     Data Reviewed: I have personally reviewed following labs and imaging studies  CBC: Recent Labs  Lab 07/28/17 1830 07/31/17 1851 08/02/17 0251  WBC 9.7 8.9 7.4  NEUTROABS  --   --  4.9  HGB 13.2 12.7* 12.2*  HCT 41.7 39.9 39.4  MCV 81.6 81.1 82.1  PLT 283 283 948   Basic Metabolic Panel: Recent Labs  Lab 07/28/17 1830 07/30/17 0436 07/31/17 0301 08/01/17 0403 08/02/17 0251  NA 141 138 140 136 136  K 4.2 5.1 5.5* 3.8 4.3  CL 100* 100* 100* 97* 97*  CO2 25 22 22 25 24   GLUCOSE 101* 82 67 89 83  BUN 45* 63* 78* 46* 60*  CREATININE 10.46* 12.54* 14.77* 10.55* 12.68*  CALCIUM 9.2 9.2 9.1 8.5* 8.6*  MG  --   --   --   --  2.1  PHOS  --   --   --   --  6.4*   GFR: Estimated Creatinine Clearance: 10.1 mL/min (A) (by C-G formula based on SCr of 12.68 mg/dL (H)). Liver Function Tests: Recent Labs  Lab 08/01/17 0403 08/02/17 0251  AST 14* 11*  ALT 9* 9*  ALKPHOS 111 103  BILITOT 0.9 0.7  PROT 7.1 6.9  ALBUMIN 2.9* 2.9*   No results for input(s): LIPASE, AMYLASE in the last 168  hours. No results for input(s): AMMONIA in the last 168 hours. Coagulation Profile: Recent Labs  Lab 08/01/17 1232  INR 1.38   Cardiac Enzymes: No results for input(s): CKTOTAL, CKMB, CKMBINDEX, TROPONINI in the last 168 hours. BNP (last 3 results) No results for input(s): PROBNP in the last 8760 hours. HbA1C: No results for input(s): HGBA1C in the last 72 hours. CBG: No results for input(s): GLUCAP in the last 168 hours. Lipid Profile: No results for input(s): CHOL, HDL, LDLCALC, TRIG, CHOLHDL, LDLDIRECT in the last 72 hours. Thyroid Function Tests: No results for input(s): TSH, T4TOTAL, FREET4, T3FREE, THYROIDAB in the last 72 hours. Anemia Panel: No results for input(s): VITAMINB12, FOLATE, FERRITIN, TIBC, IRON, RETICCTPCT in the last 72 hours. Sepsis Labs: No results for input(s): PROCALCITON, LATICACIDVEN in the last 168 hours.  Recent Results (from the past 240 hour(s))  Blood culture (routine x 2)     Status: None (Preliminary result)   Collection Time: 07/28/17 11:09 PM  Result Value  Ref Range Status   Specimen Description BLOOD RIGHT HAND  Final   Special Requests   Final    BOTTLES DRAWN AEROBIC AND ANAEROBIC Blood Culture adequate volume   Culture   Final    NO GROWTH 4 DAYS Performed at [facility], [address], [place], [place] [address]    Report Status PENDING  Incomplete  Blood culture (routine x 2)     Status: None (Preliminary result)   Collection Time: 07/28/17 11:28 PM  Result Value Ref Range Status   Specimen Description BLOOD RIGHT ARM  Final   Special Requests   Final    BOTTLES DRAWN AEROBIC AND ANAEROBIC Blood Culture results may not be optimal due to an excessive volume of blood received in culture bottles   Culture   Final    NO GROWTH 4 DAYS Performed at [facility], [place] [address] [place], [place] [address]    Report Status PENDING  Incomplete  Culture, blood (single)     Status: None (Preliminary result)   Collection  Time: 07/29/17 12:55 AM  Result Value Ref Range Status   Specimen Description BLOOD RIGHT ANTECUBITAL  Final   Special Requests   Final    BOTTLES DRAWN AEROBIC AND ANAEROBIC Blood Culture adequate volume   Culture   Final    NO GROWTH 4 DAYS Performed at [facility], [place] [address], [place], [place] [address]    Report Status PENDING  Incomplete  MRSA PCR Screening     Status: Abnormal   Collection Time: 07/29/17  2:21 AM  Result Value Ref Range Status   MRSA by PCR POSITIVE (A) NEGATIVE Final    Comment:        The GeneXpert MRSA Assay (FDA approved for NASAL specimens only), is one component of a comprehensive MRSA colonization surveillance program. It is not intended to diagnose MRSA infection nor to guide or monitor treatment for MRSA infections. RESULT CALLED TO, READ BACK BY AND VERIFIED WITH: Evern Core RN 07/29/17 0932 JDW          Radiology Studies: Dg Orthopantogram  Result Date: 08/01/2017 CLINICAL DATA:  Port intention. EXAM: ORTHOPANTOGRAM/PANORAMIC COMPARISON:  No recent prior. FINDINGS: Small periapical lucency noted about the remaining left lower molar. Severe multifocal dental caries. Mandible otherwise appears to be intact. If a mandibular abscess is suspected maxillofacial CT can be obtained. IMPRESSION: Small periapical lucency noted about the remaining left lower molar. Severe multifocal dental caries. Electronically Signed   By: Loomis   On: 08/01/2017 16:27        Scheduled Meds: . Derrill Memo ON 08/03/2017] apixaban  5 mg Oral BID  . atorvastatin  40 mg Oral q1800  . calcium acetate  1,334 mg Oral TID WC  . Chlorhexidine Gluconate Cloth  6 each Topical Q0600  . cinacalcet  90 mg Oral Q breakfast  . ivabradine  5 mg Oral BID WC  . lanthanum  2,000 mg Oral TID WC  . [START ON 08/03/2017] metoprolol succinate  25 mg Oral Daily  . midodrine  5 mg Oral Q T,Th,Sa-HD  . multivitamin  1 tablet Oral QHS  . mupirocin ointment  1 application  Nasal BID  . sodium chloride flush  3 mL Intravenous Q12H  . sodium chloride flush  3 mL Intravenous Q12H   Continuous Infusions: . sodium chloride    . sodium chloride       LOS: 4 days    Time  spent: 35 mins    [name], MD [facility] Pager [phone]  If 7PM-7AM, please contact night-coverage [email or web address] Password [facility] 08/02/2017, 5:32 PM

## 2017-08-03 ENCOUNTER — Other Ambulatory Visit: Payer: Self-pay | Admitting: *Deleted

## 2017-08-03 ENCOUNTER — Encounter (HOSPITAL_COMMUNITY): Payer: Medicare Other

## 2017-08-03 ENCOUNTER — Other Ambulatory Visit (HOSPITAL_COMMUNITY): Payer: Medicare Other

## 2017-08-03 DIAGNOSIS — I2510 Atherosclerotic heart disease of native coronary artery without angina pectoris: Secondary | ICD-10-CM

## 2017-08-03 DIAGNOSIS — K089 Disorder of teeth and supporting structures, unspecified: Secondary | ICD-10-CM

## 2017-08-03 DIAGNOSIS — R931 Abnormal findings on diagnostic imaging of heart and coronary circulation: Secondary | ICD-10-CM

## 2017-08-03 DIAGNOSIS — I351 Nonrheumatic aortic (valve) insufficiency: Secondary | ICD-10-CM

## 2017-08-03 LAB — CULTURE, BLOOD (SINGLE)
CULTURE: NO GROWTH
SPECIAL REQUESTS: ADEQUATE

## 2017-08-03 LAB — BASIC METABOLIC PANEL WITH GFR
Anion gap: 14 (ref 5–15)
BUN: 40 mg/dL — ABNORMAL HIGH (ref 6–20)
CO2: 24 mmol/L (ref 22–32)
Calcium: 8.6 mg/dL — ABNORMAL LOW (ref 8.9–10.3)
Chloride: 98 mmol/L — ABNORMAL LOW (ref 101–111)
Creatinine, Ser: 10.24 mg/dL — ABNORMAL HIGH (ref 0.61–1.24)
GFR calc Af Amer: 6 mL/min — ABNORMAL LOW
GFR calc non Af Amer: 5 mL/min — ABNORMAL LOW
Glucose, Bld: 83 mg/dL (ref 65–99)
Potassium: 4.3 mmol/L (ref 3.5–5.1)
Sodium: 136 mmol/L (ref 135–145)

## 2017-08-03 LAB — CULTURE, BLOOD (ROUTINE X 2)
CULTURE: NO GROWTH
CULTURE: NO GROWTH
Special Requests: ADEQUATE

## 2017-08-03 LAB — CBC
HCT: 39.7 % (ref 39.0–52.0)
Hemoglobin: 12.4 g/dL — ABNORMAL LOW (ref 13.0–17.0)
MCH: 25.5 pg — ABNORMAL LOW (ref 26.0–34.0)
MCHC: 31.2 g/dL (ref 30.0–36.0)
MCV: 81.7 fL (ref 78.0–100.0)
Platelets: 235 K/uL (ref 150–400)
RBC: 4.86 MIL/uL (ref 4.22–5.81)
RDW: 15.3 % (ref 11.5–15.5)
WBC: 7.6 K/uL (ref 4.0–10.5)

## 2017-08-03 MED ORDER — ATORVASTATIN CALCIUM 40 MG PO TABS: 40.0000 mg | ORAL_TABLET | Freq: Every day | ORAL | 0 refills | Status: DC

## 2017-08-03 MED ORDER — IVABRADINE HCL 5 MG PO TABS: 5.0000 mg | ORAL_TABLET | Freq: Two times a day (BID) | ORAL | 0 refills | Status: DC

## 2017-08-03 MED ORDER — METOPROLOL SUCCINATE ER 25 MG PO TB24: 25.0000 mg | ORAL_TABLET | Freq: Every day | ORAL | 0 refills | Status: DC

## 2017-08-03 MED FILL — Amiodarone HCl Inj 150 MG/3ML (50 MG/ML): INTRAVENOUS | Qty: 3 | Status: AC

## 2017-08-03 MED FILL — Heparin Sod (Porcine)-NaCl IV Soln 1000 Unit/500ML-0.9%: INTRAVENOUS | Qty: 1000 | Status: AC

## 2017-08-03 NOTE — Progress Notes (Signed)
Patient ID: [name] [name], male   DOB: October 29, 1965, 51 y.o.   MRN: [id number]     Advanced Heart Failure Rounding Note  PCP-Cardiologist: Kate Sable, MD   Subjective:    No complaints, had HD this morning.  Cath 4/17 as below.  Stable overnight, seen at HD today.   TEE 07/31/17 AV is tri leaflet and severely calcified. ? Rheumatic There is no vegetation seen There is [name] AR and mild AS MAC with moderate [name] EF 25-30% diffuse hypokinesis No LAA thormbus Bi Atrial Enlargement  Mild RV enlargement   LHC/RHC (4/17) Diagnostic  Dominance: Right  Left Main  No significant disease.  Left Anterior Descending  40% mid LAD stenosis. Small D2 with 95% ostial stenosis. Small D3 with 70% mid vessel stenosis.  Left Circumflex  Serial 80% ostial and 80% proximal LCx. Large vessel.  Right Coronary Artery  Luminal irregularities.  Intervention   No interventions have been documented.  Right Heart   Right Heart Pressures RHC Procedural Findings: Hemodynamics (mmHg) RA mean 11 RV 51/14 PA 53/24, mean 37 PCWP mean 26 LV 105/32 AO 97/65  Oxygen saturations: PA 61% AO 91%  Cardiac Output (Fick) 7  Cardiac Index (Fick) 2.67  PVR 1.6 WU  Cardiac Output (Thermo) 6.4 Cardiac Index (Thermo) 2.44     Objective:   Weight Range: (!) 321 lb 10.4 oz (145.9 kg) Body mass index is 43.62 kg/m.   Vital Signs:   Temp:  [97.8 F (36.6 C)-98.9 F (37.2 C)] 97.9 F (36.6 C) (04/18 0751) Pulse Rate:  [0-161] 97 (04/18 0900) Resp:  [4-137] 19 (04/18 0900) BP: (87-125)/(55-92) 115/55 (04/18 0900) SpO2:  [0 %-98 %] 96 % (04/18 0751) Weight:  [320 lb 12.3 oz (145.5 kg)-322 lb (146.1 kg)] 321 lb 10.4 oz (145.9 kg) (04/18 0751) Last BM Date: 08/02/17  Weight change: Filed Weights   08/02/17 1030 08/03/17 0558 08/03/17 0751  Weight: (!) 320 lb 12.3 oz (145.5 kg) (!) 322 lb (146.1 kg) (!) 321 lb 10.4 oz (145.9 kg)   Intake/Output:   Intake/Output Summary (Last 24 hours) at  08/03/2017 0925 Last data filed at 08/02/2017 1700 Gross per 24 hour  Intake 363 ml  Output 1000 ml  Net -637 ml    Physical Exam    General: NAD Neck: JVP 8-9 cm, no thyromegaly or thyroid nodule.  Lungs: Clear to auscultation bilaterally with normal respiratory effort. CV: Nondisplaced PMI.  Heart regular S1/S2, no B7/J6, 2/6 diastolic RCVELF/8/1 systolic murmur across precordium.  No peripheral edema.   Abdomen: Soft, nontender, no hepatosplenomegaly, no distention.  Skin: Intact without lesions or rashes.  Neurologic: Alert and oriented x 3.  Psych: Normal affect. Extremities: No clubbing or cyanosis.  HEENT: Normal.   Telemetry   NSR 90s currently (personally reviewed)   EKG    No new tracings.    Labs    CBC Recent Labs    08/02/17 0251 08/03/17 0250  WBC 7.4 7.6  NEUTROABS 4.9  --   HGB 12.2* 12.4*  HCT 39.4 39.7  MCV 82.1 81.7  PLT 225 017   Basic Metabolic Panel Recent Labs    08/02/17 0251 08/03/17 0250  NA 136 136  K 4.3 4.3  CL 97* 98*  CO2 24 24  GLUCOSE 83 83  BUN 60* 40*  CREATININE 12.68* 10.24*  CALCIUM 8.6* 8.6*  MG 2.1  --   PHOS 6.4*  --    Liver Function Tests Recent Labs    08/01/17  0403 08/02/17 0251  AST 14* 11*  ALT 9* 9*  ALKPHOS 111 103  BILITOT 0.9 0.7  PROT 7.1 6.9  ALBUMIN 2.9* 2.9*   No results for input(s): LIPASE, AMYLASE in the last 72 hours. Cardiac Enzymes No results for input(s): CKTOTAL, CKMB, CKMBINDEX, TROPONINI in the last 72 hours.  BNP: BNP (last 3 results) Recent Labs    07/28/17 1831  BNP 1,098.0*    ProBNP (last 3 results) No results for input(s): PROBNP in the last 8760 hours.   D-Dimer No results for input(s): DDIMER in the last 72 hours. Hemoglobin A1C No results for input(s): HGBA1C in the last 72 hours. Fasting Lipid Panel No results for input(s): CHOL, HDL, LDLCALC, TRIG, CHOLHDL, LDLDIRECT in the last 72 hours. Thyroid Function Tests No results for input(s): TSH, T4TOTAL,  T3FREE, THYROIDAB in the last 72 hours.  Invalid input(s): FREET3  Other results:   Imaging    No results found.   Medications:     Scheduled Medications: . apixaban  5 mg Oral BID  . atorvastatin  40 mg Oral q1800  . calcium acetate  1,334 mg Oral TID WC  . Chlorhexidine Gluconate Cloth  6 each Topical Q0600  . cinacalcet  90 mg Oral Q breakfast  . ivabradine  5 mg Oral BID WC  . lanthanum  2,000 mg Oral TID WC  . metoprolol succinate  25 mg Oral Daily  . midodrine  5 mg Oral Q T,Th,Sa-HD  . multivitamin  1 tablet Oral QHS  . mupirocin ointment  1 application Nasal BID  . sodium chloride flush  3 mL Intravenous Q12H  . sodium chloride flush  3 mL Intravenous Q12H    Infusions: . sodium chloride    . sodium chloride      PRN Medications: sodium chloride, sodium chloride, acetaminophen, acetaminophen, ALPRAZolam, benzonatate, calcium acetate, lanthanum, ondansetron (ZOFRAN) IV, ondansetron (ZOFRAN) IV, sodium chloride flush, sodium chloride flush, zolpidem    Patient Profile   [name] [name] is a 52 year old with history of ESRD TTS [place], smoker, sinus tach, AV graft thrombosis, and morbid obesity.   Newly reduced EF and vegetation on LVOT.   Assessment/Plan   1. 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  No active vegetation seen.  Blood cultures negative.  [name] AI could certainly potentiate CHF. This was not see on 2012 echo.  The LV is not significantly dilated, so not sure AI is the only explanation for his cardiomyopathy.  - He has been seen by TCTS, will need aortic valve replacement and possible MV repair.  Will need bypass of LCx system. He likely can go home today after HD, will need followup with Dr. Roxy Manns to set up surgery. Will also need followup with Dr. Enrique Sack for pre-surgery dental extractions.   2. Acute on ?chronic  systolic CHF:  Echo with EF 20-25%.  As LV is not markedly dilated, I am not sure that aortic insufficiency can be invoked as the only cause for cardiomyopathy.  He has chronic sinus tachycardia x years but HR has been in 100s-110s, do not think this would have been likely to cause a tachy-mediated CMP.  He has serial 80% stenoses in the LCx, but do not think this can explain all of the cardiomyopathy either.  It is possible that CMP is multifactorial.  He remained volume overloaded by RHC even after several days  of HD.  Getting HD again today.   - Continue Corlanor 5 mg bid and with SVT, increased Toprol XL to 25 mg daily.  - No BP room to add other HF meds, he actually requires midodrine with HD.  - Think he can probably go home after HD today.  3. ESRD: HD today.   4. Suspected OSA: Needs sleep study.  5. SVT: In 150s yesterday, do not think it was flutter.  Broke with Lopressor.  Increased Toprol XL to 25 mg daily as above.   6. CAD: See cath above. No ACS.  Started statin.  He will be on Eliquis because of dialysis access issues so will not start ASA. Will need SVG-OM with valve surgery.  7. Disposition: I think patient can go home today.  Will need followup in CHF clinic.  Will need followup with Drs Roxy Manns and Enrique Sack.  Cardiac meds for home: Eliquis 5 bid, atorvastatin 40 daily, ivabradine 5 bid, Toprol XL 25 daily.   Loralie Champagne 08/03/2017 9:25 AM

## 2017-08-03 NOTE — Procedures (Signed)
Patient seen and examined on Hemodialysis. QB 400 mL/ min L RC AVF UF goal 2L  Treatment adjusted as needed.  Madelon Lips MD [phone] 9:25 AM

## 2017-08-03 NOTE — Progress Notes (Signed)
Pt off monitor, awaiting d/c. He has been up in room, no problems. Discussed HF, low sodium, sx, daily wts, sternal precautions, mobility, IS (2500 mL), and d/c plan with pt and wife. Good reception. He has materials to read. Will also discuss diet with his RD at HD.  [id number] Yves Dill CES, ACSM 2:49 PM 08/03/2017

## 2017-08-03 NOTE — Progress Notes (Signed)
PROGRESS NOTE:  08/03/2017 [name] [name] [id number]  VITALS: BP (!) (P) 123/53 (BP Location: Right Arm)   Pulse (P) 93   Temp (P) 97.9 F (36.6 C) (Oral)   Resp (!) (P) 29   Ht 6' (1.829 m)   Wt (!) (P) 316 lb 12.8 oz (143.7 kg)   SpO2 (P) 95%   BMI (P) 42.97 kg/m   Lab Results  Component Value Date   WBC 7.6 08/03/2017   HGB 12.4 (L) 08/03/2017   HCT 39.7 08/03/2017   MCV 81.7 08/03/2017   PLT 235 08/03/2017   BMET    Component Value Date/Time   NA 136 08/03/2017 0250   NA 137 11/13/2013 0408   K 4.3 08/03/2017 0250   K 4.1 11/13/2013 0408   CL 98 (L) 08/03/2017 0250   CL 98 11/13/2013 0408   CO2 24 08/03/2017 0250   CO2 25 11/13/2013 0408   GLUCOSE 83 08/03/2017 0250   GLUCOSE 64 (L) 11/13/2013 0408   BUN 40 (H) 08/03/2017 0250   BUN 68 (H) 11/13/2013 0408   CREATININE 10.24 (H) 08/03/2017 0250   CREATININE 16.45 (H) 11/13/2013 0408   CALCIUM 8.6 (L) 08/03/2017 0250   CALCIUM 6.8 (LL) 11/13/2013 0408   GFRNONAA 5 (L) 08/03/2017 0250   GFRNONAA 3 (L) 11/13/2013 0408   GFRAA 6 (L) 08/03/2017 0250   GFRAA 3 (L) 11/13/2013 0408    Lab Results  Component Value Date   INR 1.38 08/01/2017   INR 1.26 03/16/2016   INR 1.25 11/13/2015   No results found for: PTT   [name] [name] is seen again to discuss recent retake of the orthopantogram along with anticipated extraction of indicated teeth with alveoloplasty and gross debridement remaining dentition in the operating room with general anesthesia.  Patient is currently being discharged to home but will need to return for operating procedure this coming Monday as an outpatient.  This is scheduled for August 07, 2017 at 10:30 AM.  Patient will be expected to arrive through Short Stay at 8:30 AM.  Patient was reminded to be n.p.o. after midnight.  Presurgical testing is to contact the patient and let him know which medications to take on the day of the surgery.  Patient is to discontinue Eliquis therapy after his morning  dose this Friday to allow for dental extractions on Monday morning.  SUBJECTIVE: The patient denies any acute medical or dental changes and agrees to proceed with treatment as planned.  Patient understands that the multiple retained root segments of the upper right and upper left quadrants will be extracted along with the retained root segment in the area of #7.  The lower left and lower right molars will be evaluated for extraction as well along with other teeth as indicated upon a more thorough examination of the operating room while under general anesthesia.  EXAM: No sign of acute dental changes.  ASSESSMENT: Patient is affected by multiple retained root segments, dental caries, chronic periodontitis, accretions, and tooth mobility.  PLAN: Patient agrees to proceed with treatment as planned in the operating room as previously discussed and accepts the risks, benefits, and complications of the proposed treatment. Patient is aware of the risk for bleeding, bruising, swelling, infection, pain, nerve damage, soft tissue damage, damage to adjacent teeth, sinus involvement, root tip fracture, mandible fracture, and the risks of complications associated with the anesthesia. Patient also is aware of the potential for other complications to and including death due to his overall  cardiovascular compromise.     [name], DDS

## 2017-08-03 NOTE — Progress Notes (Signed)
Order received to discharge patient.  PIV access removed.  Telemetry monitor removed and CCMD notified.  Discharge instructions, follow up, medications and instructions for their use discussed with patient. 

## 2017-08-03 NOTE — Discharge Summary (Addendum)
Physician Discharge Summary  [name] [name] RXV:[id number] DOB: [birth year] DOA: 07/28/2017  PCP: System, Provider Not In  Admit date: 07/28/2017 Discharge date: 08/03/2017  Time spent: 65 minutes  Recommendations for Outpatient Follow-up:  1. Follow-up in regular hemodialysis on Saturday, 08/05/2017. 2. Follow-up at [facility] outpatient surgery/short stay on Monday, 08/07/2017 with Dr. Enrique Sack, dentistry. 3. Follow-up with Dr. Aundra Dubin, cardiology 08/14/2017. 4. Follow-up with Dr. Alvan Dame, cardiothoracic surgery in 1 week to schedule outpatient valvular repair.   Discharge Diagnoses:  Principal Problem:   Acute on chronic combined systolic and diastolic heart failure (HCC) Active Problems:   ESRD (end stage renal disease) (HCC)   Morbid obesity (HCC)   End stage renal disease (HCC)   Systolic CHF (HCC)   Severe aortic insufficiency   Mitral regurgitation   Abnormal echocardiogram   Poor dentition   Discharge Condition: Stable and improved  Diet recommendation: Heart healthy  Filed Weights   08/03/17 0558 08/03/17 0751 08/03/17 1205  Weight: (!) 146.1 kg (322 lb) (!) 145.9 kg (321 lb 10.4 oz) (!) (P) 143.7 kg (316 lb 12.8 oz)    History of present illness:  Per Dr. Fuller Song is a 52 y.o. 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  Patient was evaluated by cardiology for 2 months of persistent cough, sinus tachycardia, and paroxysmal nocturnal dyspnea.  He underwent an outpatient echocardiogram on the day of admission, with EF 20-25% and a large globular and calcified density in the LV outflow tract.  He was directed to the ED for further evaluation of this.  Patient denied any recent fevers, chills, sweats, or weight loss.  He  denied any history of IV drug use.  Denied chest pain.  Reported completing his dialysis session on 07/27/2017 without incident.  He has had more fluid removed with dialysis recently in an attempt to ameliorate his symptoms.  ED Course: Upon arrival to the ED, patient was found to be afebrile, saturating well on room air, tachycardic in the 110s, and with low-normal blood pressure.  Chemistry panel featured normal electrolytes.  CBC was unremarkable.  Troponin was normal and BNP was elevated to 1098.  EKG featured a sinus tachycardia with rate 108 and lateral T wave inversion.  Chest x-ray was negative for acute cardiopulmonary disease.  Cardiology was consulted by the ED physician and recommended blood cultures and medical admission.  Blood cultures were collected in the ED, patient remained hemodynamically stable, and admitted to the telemetry unit for ongoing evaluation and management of new systolic CHF with LV outflow tract obstruction.    Hospital Course:  #1 acute on chronic combined systolic and diastolic CHF/severe aortic valvular insufficiency/moderate mitral regurgitation/cardiomyopathy Patient was admitted with further evaluation of worsening shortness of breath and abnormal 2D echo done in the outpatient setting.  Patient was noted to be volume overloaded on admission and underwent hemodialysis with clinical improvement.  Cardiology consulted.  Patient underwent cardiac catheterization 08/02/2017 showing 40% mid LAD stenosis, small D2 with 95% ostial stenosis, small D3 with 70% mid vessel stenosis.  Left circumflex with serial 80% ostial and 80% proximal left circumflex.  Large vessel.  Luminal irregularities in the right coronary artery.  TEE done 07/31/2017 with trileaflet aortic valve that was severely calcified with severe AR and mild left  ear.  EF 25-30% with diffuse hypokinesis.  No LAA thrombus.  Per cardiology patient noted to be volume overloaded on right heart cath post hemodialysis and  may need hemodialysis which he received on his regular hemodialysis day of Thursday, 08/03/2017.  Patient improved clinically.  Patient started on atorvastatin 40 mg daily, Ivabradine 5 mg twice daily and Toprol-XL increased to 25 mg daily.  Again tomorrow.  CVTs has has been consulted and patient will follow-up in the outpatient setting for aortic valve replacement and possible mitral valvular repair as well as bypass of left circumflex system.  Patient improved clinically and will be discharged home in stable and improved condition.  Patient will follow up with cardiology in the outpatient setting as well as cardiothoracic surgery.  2.  SVT Noted on presentation to cath lab patient was noted to have SVT which broke to normal sinus rhythm post 5 mg IV Lopressor.  Toprol-XL dose has been increased to 25 mg daily per cardiology.  Patient had no further episodes.  Outpatient follow-up with cardiology.  3.  End-stage renal disease Nephrology was consulted and patient underwent hemodialysis during the hospitalization including hemodialysis on 08/02/2017 and 08/03/2017.  Patient improved clinically.  Outpatient follow-up at his regular hemodialysis session on Saturday, 08/05/2017.    4.  Obesity hypoventilation syndrome Patient did state that he has been scheduled for outpatient sleep study to rule out obstructive sleep apnea.   5.  Shunt disorder Patient maintained on home regimen of Eliquis secondary to AV graft thrombosis.  6.  Morbid obesity Continue outpatient follow-up with his nephrologist who has been guiding his weight loss efforts in the past.  7.  Multiple retained root segments, dental caries, chronic periodontitis, accretions, tooth mobility In preparation for valvular repair, dentistry, Dr. Enrique Sack is consulted and assessed the patient and had recommended multiple extractions with alveoloplasty and gross debridement remaining dentition in the operating with general anesthesia.  Patient  was in agreement with this and patient will follow-up in the operating room on Monday, 08/07/2017.       Procedures:  Chest x-ray 07/30/2017, 07/28/2017  Orthopantogram 08/01/2017  2D echo 07/28/2017  Transesophageal echocardiogram 07/31/2017  Catheterization 08/02/2017 per Dr. Aundra Dubin      Consultations:  Nephrology: Dr.Schertz 07/30/2017  Cardiology: Dr. Aundra Dubin 07/31/2017  Cardiothoracic surgery Dr. Roxy Manns 08/01/2017  History: Dr. Enrique Sack 08/02/2017      Discharge Exam: Vitals:   08/03/17 1200 08/03/17 1205  BP: (!) (P) 108/37 (!) (P) 123/53  Pulse: (P) 90 (P) 93  Resp: (!) (P) 30 (!) (P) 29  Temp:  (P) 97.9 F (36.6 C)  SpO2:  (P) 95%    General: nad Cardiovascular: RRR Respiratory: CTAB anterior lung fields.  Discharge Instructions   Discharge Instructions    Diet - low sodium heart healthy   Complete by:  As directed    Discharge instructions   Complete by:  As directed    After your morning dose of eliquis on Friday 08/04/2017, stop taking it until after tooth extraction and when instructed by Dr Enrique Sack, dentistry.   Increase activity slowly   Complete by:  As directed      Allergies as of 08/03/2017   No Known Allergies     Medication List    STOP taking these medications   aspirin EC 81 MG tablet     TAKE these medications   apixaban 5 MG Tabs tablet Commonly known as:  ELIQUIS Take 1 tablet (5 mg total) 2 (two) times daily  by mouth.   atorvastatin 40 MG tablet Commonly known as:  LIPITOR Take 1 tablet (40 mg total) by mouth daily at 6 PM.   benzonatate 100 MG capsule Commonly known as:  TESSALON 2 po tid for cough What changed:    how much to take  how to take this  when to take this  reasons to take this  additional instructions   calcium acetate 667 MG capsule Commonly known as:  PHOSLO Take 2,001 mg by mouth See admin instructions. 1334 mg 3 times a day with meals and 667 mg with snacks   cinacalcet 90 MG  tablet Commonly known as:  SENSIPAR Take 90 mg by mouth daily.   FOSRENOL 500 MG chewable tablet Generic drug:  lanthanum Chew 2,000 mg by mouth See admin instructions. Take 2000 mg with meals and 1000 mg with snacks   ivabradine 5 MG Tabs tablet Commonly known as:  CORLANOR Take 1 tablet (5 mg total) by mouth 2 (two) times daily with a meal.   metoprolol succinate 25 MG 24 hr tablet Commonly known as:  TOPROL-XL Take 1 tablet (25 mg total) by mouth daily.   midodrine 5 MG tablet Commonly known as:  PROAMATINE Take 1 tablet by mouth See admin instructions. Pt may take before dialysis treatment, Tues, Thurs and Sat   NEPHRO-VITE PO Take 1 tablet by mouth at bedtime.   nitroGLYCERIN 0.4 MG SL tablet Commonly known as:  NITROSTAT Place 1 tablet (0.4 mg total) under the tongue every 5 (five) minutes as needed. For chest      No Known Allergies Follow-up Information    Larey Dresser, MD Follow up on 08/14/2017.   Specialty:  Cardiology Why:  Heart Failure Followup @ [facility]-10:20-Parking in ER lot (enter under blue awning to left of ER), or under [place] on [facility] (construction entrance, garage code: 1100, elevator to 1st floor). Take all am meds, bring all med bottles. Contact information: [place] [address] [place] Follow up on 08/07/2017.   Why:  f/u at 830am Contact information: [address] [id number] [place]       [name], MD. Schedule an appointment as soon as possible for a visit in 1 week(s).   Specialty:  Cardiothoracic Surgery Contact information: [place] [address] [phone]        HD Follow up on 08/05/2017.   Why:  F/U AS SCHEDULED ON sATURDAY 08/05/2017           The results of significant diagnostics from this hospitalization (including  imaging, microbiology, ancillary and laboratory) are listed below for reference.    Significant Diagnostic Studies: Dg Orthopantogram  Result Date: 08/01/2017 CLINICAL DATA:  Port intention. EXAM: ORTHOPANTOGRAM/PANORAMIC COMPARISON:  No recent prior. FINDINGS: Small periapical lucency noted about the remaining left lower molar. Severe multifocal dental caries. Mandible otherwise appears to be intact. If a mandibular abscess is suspected maxillofacial CT can be obtained. IMPRESSION: Small periapical lucency noted about the remaining left lower molar. Severe multifocal dental caries. Electronically Signed   By: Marcello Moores  Register   On: 08/01/2017 16:27   Dg Chest 2 View  Result Date: 07/28/2017 CLINICAL DATA:  Tachycardia.  Patient is asymptomatic. EXAM: CHEST - 2 VIEW COMPARISON:  Chest x-ray dated July 14, 2017. FINDINGS: The heart size and mediastinal contours are within  normal limits. Prominent central pulmonary arteries, similar to prior study. No focal consolidation, pleural effusion, or pneumothorax. No acute osseous abnormality. IMPRESSION: No active cardiopulmonary disease. Electronically Signed   By: Titus Dubin M.D.   On: 07/28/2017 18:46   Dg Chest 2 View  Result Date: 07/14/2017 CLINICAL DATA:  Nonproductive cough for 6 weeks. History of CHF. Dialysis patient. EXAM: CHEST - 2 VIEW COMPARISON:  Chest x-ray 06/12/2017 FINDINGS: The heart is upper limits of normal in size and appears stable. There is tortuosity of the thoracic aorta. Prominent pulmonary hila appear stable and are likely due to prominent pulmonary arteries. Mild central vascular congestion without pulmonary edema or pleural effusions. No acute pulmonary findings. Mild eventration of both hemidiaphragms. The bony thorax is intact. IMPRESSION: Mild central vascular congestion without overt pulmonary edema or pleural effusion. Electronically Signed   By: Marijo Sanes M.D.   On: 07/14/2017 11:37   Dg Chest Port 1  View  Result Date: 07/30/2017 CLINICAL DATA:  Shortness of breath, end-stage renal disease on dialysis, CHF, hypertension EXAM: PORTABLE CHEST 1 VIEW COMPARISON:  Portable exam 1732 hours compared to 07/28/2017 FINDINGS: Borderline enlargement of cardiac silhouette. Mediastinal contours and pulmonary vascularity normal. Minimal LEFT basilar atelectasis. Lungs otherwise clear. No pleural effusion or pneumothorax. Bones unremarkable. IMPRESSION: No acute abnormalities. Electronically Signed   By: Lavonia Dana M.D.   On: 07/30/2017 18:16    Microbiology: Recent Results (from the past 240 hour(s))  Blood culture (routine x 2)     Status: None   Collection Time: 07/28/17 11:09 PM  Result Value Ref Range Status   Specimen Description BLOOD RIGHT HAND  Final   Special Requests   Final    BOTTLES DRAWN AEROBIC AND ANAEROBIC Blood Culture adequate volume   Culture   Final    NO GROWTH 5 DAYS Performed at [facility], [address], [place], [place] [address]    Report Status 08/03/2017 FINAL  Final  Blood culture (routine x 2)     Status: None   Collection Time: 07/28/17 11:28 PM  Result Value Ref Range Status   Specimen Description BLOOD RIGHT ARM  Final   Special Requests   Final    BOTTLES DRAWN AEROBIC AND ANAEROBIC Blood Culture results may not be optimal due to an excessive volume of blood received in culture bottles   Culture   Final    NO GROWTH 5 DAYS Performed at [facility], [place] [address] [place], [place] [address]    Report Status 08/03/2017 FINAL  Final  Culture, blood (single)     Status: None   Collection Time: 07/29/17 12:55 AM  Result Value Ref Range Status   Specimen Description BLOOD RIGHT ANTECUBITAL  Final   Special Requests   Final    BOTTLES DRAWN AEROBIC AND ANAEROBIC Blood Culture adequate volume   Culture   Final    NO GROWTH 5 DAYS Performed at [facility], [place] [address], [place], [place] [address]    Report Status 08/03/2017 FINAL   Final  MRSA PCR Screening     Status: Abnormal   Collection Time: 07/29/17  2:21 AM  Result Value Ref Range Status   MRSA by PCR POSITIVE (A) NEGATIVE Final    Comment:        The GeneXpert MRSA Assay (FDA approved for NASAL specimens only), is one component of a comprehensive MRSA colonization surveillance program. It is not intended to diagnose MRSA infection nor  to guide or monitor treatment for MRSA infections. RESULT CALLED TO, READ BACK BY AND VERIFIED WITH: Evern Core RN 07/29/17 0655 JDW      Labs: Basic Metabolic Panel: Recent Labs  Lab 07/30/17 0436 07/31/17 0301 08/01/17 0403 08/02/17 0251 08/03/17 0250  NA 138 140 136 136 136  K 5.1 5.5* 3.8 4.3 4.3  CL 100* 100* 97* 97* 98*  CO2 22 22 25 24 24   GLUCOSE 82 67 89 83 83  BUN 63* 78* 46* 60* 40*  CREATININE 12.54* 14.77* 10.55* 12.68* 10.24*  CALCIUM 9.2 9.1 8.5* 8.6* 8.6*  MG  --   --   --  2.1  --   PHOS  --   --   --  6.4*  --    Liver Function Tests: Recent Labs  Lab 08/01/17 0403 08/02/17 0251  AST 14* 11*  ALT 9* 9*  ALKPHOS 111 103  BILITOT 0.9 0.7  PROT 7.1 6.9  ALBUMIN 2.9* 2.9*   No results for input(s): LIPASE, AMYLASE in the last 168 hours. No results for input(s): AMMONIA in the last 168 hours. CBC: Recent Labs  Lab 07/28/17 1830 07/31/17 1851 08/02/17 0251 08/03/17 0250  WBC 9.7 8.9 7.4 7.6  NEUTROABS  --   --  4.9  --   HGB 13.2 12.7* 12.2* 12.4*  HCT 41.7 39.9 39.4 39.7  MCV 81.6 81.1 82.1 81.7  PLT 283 283 225 235   Cardiac Enzymes: No results for input(s): CKTOTAL, CKMB, CKMBINDEX, TROPONINI in the last 168 hours. BNP: BNP (last 3 results) Recent Labs    07/28/17 1831  BNP 1,098.0*    ProBNP (last 3 results) No results for input(s): PROBNP in the last 8760 hours.  CBG: No results for input(s): GLUCAP in the last 168 hours.     Signed:  [name] MD.  [facility] 08/03/2017, 3:26 PM

## 2017-08-03 NOTE — Care Management Note (Signed)
Case Management Note Marvetta Gibbons RN, BSN Unit 4E-Case Manager [phone]  Patient Details  Name: [name] [name] MRN: [id number] Date of Birth: 11-Oct-1965  Subjective/Objective:  Pt admitted with abnormal echo, newly reduced EF and vegetation on LVOT.  Acute HF                  Action/Plan: PTA Pt lived at home with spouse- independent, CM to follow for transition of care needs- per MD note plan for right heart cath on 4/17.   Expected Discharge Date:     08/03/17             Expected Discharge Plan:  Home/Self Care  In-House Referral:  NA  Discharge planning Services  CM Consult  Post Acute Care Choice:  NA Choice offered to:  NA  DME Arranged:    DME Agency:     HH Arranged:    HH Agency:     Status of Service:  Completed, signed off  If discussed at [employer] of Stay Meetings, dates discussed:    Discharge Disposition: home/self care  Additional Comments:  08/03/17- 1425- Harel Repetto RN, CM-  Pt to f/u as outpt for possible MV repair, to come in as oupt for dental procedure prior to MV repair-dental to arrange. - plan for pt to d/c later today after HD.  Dawayne Patricia, RN 08/03/2017, 2:28 PM

## 2017-08-03 NOTE — Consult Note (Signed)
            [facility] CM Primary Care Navigator  08/03/2017  [name] [name] 10-07-1965 [id number]   Attempt toseepatient at the bedsideto identify possible discharge needs buthe was alreadydischargedhome per staff.   PerMD note, patient presented to the ED at the direction of his cardiologist for further evaluation and management of new systolic congestive HF with left ventricular outflow tract obstruction. Patient was admitted with further evaluation of worsening shortness of breath and abnormal 2D echo done in the outpatient setting.  Patient was noted to be volume overloaded on admission and underwent hemodialysis with clinical improvement.  Patient hasdischarge instruction to follow-up at [facility] outpatient surgery/short stay on Monday, 08/07/2017 with Dr. Enrique Sack, dentistry; follow-up with cardiothoracic surgery in 1 week to schedule outpatient valvular repair and follow-up with Dr. Aundra Dubin, cardiology 08/14/2017.  Primary care provider's office called (Dr.Belayenh Befekadu with [facility]) to notify ofpatient's discharge and need for post hospital follow-up and transition of care (TOC). Notified of patient'shealth issues needing follow-up (mainly congestive HF).  Made aware to refer patient to [facility] care management as deemed necessaryandappropriate for services.   For additional questions please contact:  Edwena Felty A. Mieka Leaton, BSN, RN-BC [facility] PRIMARY CARE Navigator Cell: [phone]

## 2017-08-03 NOTE — Progress Notes (Signed)
  [facility] Progress Note   Assessment/ Plan:    1  ESRD - on HD TTS, HD Monday, wed and now back on regular schedule today (Thursday).  Midodrine before HD for BP support--> in light of new TEE findings I think that AR and reduced LVEF likely cause of low BP on dialysis. LHC with increased PCWP which was a little unexpected, will try for more vol removal today.  From my perspective can be discharged today after HD.  2.  Severe AR and mod MR: CV surg c/s, will need AV replacement, possible MV repair, and bypass of LCx system.  Has seen dental as well and needs some extractions.  Will f/u as OP with cardiac surgery.   3. 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  5  Anemia ckd - Hb 13, no need for esa   Subjective:    For HD on regular schedule today   Objective:   BP 121/62 (BP Location: Right Arm)   Pulse 97   Temp 97.9 F (36.6 C) (Oral)   Resp (!) 25   Ht 6' (1.829 m)   Wt (!) 145.9 kg (321 lb 10.4 oz)   SpO2 96%   BMI 43.62 kg/m   Physical Exam: Gen: on dialysis, NAD, talkative and pleasant CVS: RRR, systolic and diastolic murmur present Resp: clear bilaterally Abd: obese Ext: trace LE edema ACCESS: L RC AVF  Labs: BMET Recent Labs  Lab 07/28/17 1830 07/30/17 0436 07/31/17 0301 08/01/17 0403 08/02/17 0251 08/03/17 0250  NA 141 138 140 136 136 136  K 4.2 5.1 5.5* 3.8 4.3 4.3  CL 100* 100* 100* 97* 97* 98*  CO2 25 22 22 25 24 24   GLUCOSE 101* 82 67 89 83 83  BUN 45* 63* 78* 46* 60* 40*  CREATININE 10.46* 12.54* 14.77* 10.55* 12.68* 10.24*  CALCIUM 9.2 9.2 9.1 8.5* 8.6* 8.6*  PHOS  --   --   --   --  6.4*  --    CBC Recent Labs  Lab 07/28/17 1830 07/31/17 1851 08/02/17 0251 08/03/17 0250  WBC 9.7 8.9 7.4 7.6  NEUTROABS  --   --  4.9  --   HGB 13.2 12.7* 12.2* 12.4*  HCT 41.7 39.9 39.4 39.7  MCV 81.6 81.1 82.1 81.7  PLT 283 283 225 235    @IMGRELPRIORS @ Medications:    . apixaban  5 mg  Oral BID  . atorvastatin  40 mg Oral q1800  . calcium acetate  1,334 mg Oral TID WC  . Chlorhexidine Gluconate Cloth  6 each Topical Q0600  . cinacalcet  90 mg Oral Q breakfast  . ivabradine  5 mg Oral BID WC  . lanthanum  2,000 mg Oral TID WC  . metoprolol succinate  25 mg Oral Daily  . midodrine  5 mg Oral Q T,Th,Sa-HD  . multivitamin  1 tablet Oral QHS  . mupirocin ointment  1 application Nasal BID  . sodium chloride flush  3 mL Intravenous Q12H  . sodium chloride flush  3 mL Intravenous Q12H     [name] Lips, MD [facility] pgr [phone] 08/03/2017, 9:20 AM

## 2017-08-04 ENCOUNTER — Encounter (HOSPITAL_COMMUNITY): Payer: Self-pay | Admitting: *Deleted

## 2017-08-04 ENCOUNTER — Other Ambulatory Visit: Payer: Self-pay

## 2017-08-04 NOTE — Progress Notes (Signed)
Anesthesia Chart Review: SAME DAY WORK-UP.   Case:  [id number] Date/Time:  08/07/17 1015   Procedure:  MULTIPLE EXTRACTION WITH ALVEOLOPLASTY WITH GROSS DEBRIDEMENT OF REMAINI9NG TEETH (N/A )   Anesthesia type:  General   Pre-op diagnosis:  aortic insufficency and chronic periodontitis   Location:  [facility] OR ROOM 10 / [place] OR   Surgeon:  [name], DDS      DISCUSSION: Patient is a 52 year old male scheduled for the above procedure. Dental procedure is needed in anticipation for scheduled AV repair or replacement (with possible CABG based on 08/02/17 cath report) on 08/23/17 by Dr. Darylene Price. He was just discharged from [facility] following 07/28/17-08/03/17 admission for acute on chronic combined systolic and diastolic HF. (He was referred to the ED following out-patient echo results showing EF 20-25%, mild-moderate AR, possible AV vegetation or calcification causing LVOT, mild MR, reduced RV systolic function.) No vegetation noted on TEE, but had severe AR and mild AS, moderate MR, EF 25-30% with diffuse hypokinesis. CT surgery consulted and AV repair or replacement recommended. Dental referral recommended prior to surgery. He later had cardiac cath which suggested he may also need grafting to his CX at that time of his aortic surgery. Other history includes ESRD (TTS; on Eliquis for recurrent AVG thrombosis), morbid obesity, suspected OSA (pending sleep study). Last Eliquis dose 08/02/17.   Anesthesiologist to evaluate on the day of surgery.   PROVIDERS: - PCP System, Provider Not In  - Cardiologist is Kate Sable, MD. Re-established care on 07/20/17 for evaluation of tachycardia, following previously cardiology evaluation by Dr. Dola Argyle in 2013. - HF Cardiologist is Loralie Champagne, MD. Established during recent hospitalization.  - Nephrologist is Fran Lowes, MD  LABS: Labs from 07/28/17-08/03/17 noted. K 4.3. H/H 12.4/39.7. PLT 235. Glucose 83. PT 16.9, INR 1.38 on 08/01/17. He  will need an ISTAT4 on arrival and PT/INR.   IMAGES: 1V CXR 07/30/17: FINDINGS: Borderline enlargement of cardiac silhouette. Mediastinal contours and pulmonary vascularity normal. Minimal LEFT basilar atelectasis. Lungs otherwise clear. No pleural effusion or pneumothorax. Bones unremarkable. IMPRESSION: No acute abnormalities.  EKG: 07/31/17: SR with occasional PVCs. Baseline wanderer in limb leads.  CV: Cardiac cath 08/02/17: 1. Severe ostial/proximal LCx stenosis.  Will need bypass of this vessel when he has cardiac surgery.  2. Filling pressures remain elevated despite HD today.  Would repeat HD tomorrow.  3. Preserved cardiac output.  4. LV-gram and aortic root shot not done with LVEDP elevated to 32 mmHg.  Of note, patient arrived to cath lab in SVT rate in 150s.  He did not break with adenosine but went back into NSR after Lopressor 5 mg IV bolus.   TEE 07/31/17: Study Conclusions - Left ventricle: Hypertrophy was noted. Systolic function was   severely reduced. The estimated ejection fraction was in the   range of 25% to 30%. Diffuse hypokinesis. - Aortic valve: Valve is trileaflet and severely thickened. Did not   think there was a mass or vegetation on valve Nodular   calcification of leaflets   wtith bulky calcium. There was significatn shadowing of the mid   esohageal views obscuring the LVOT but had good images   from trans gastic imaging There was mild stenosis. There was   severe regurgitation. - Mitral valve: Moderately to severely calcified annulus.   Moderately thickened, moderately calcified leaflets . There was   moderate regurgitation. - Left atrium: The atrium was dilated. No evidence of thrombus in  the atrial cavity or appendage. - Right ventricle: The cavity size was mildly dilated. - Right atrium: No evidence of thrombus in the atrial cavity or   appendage. - Atrial septum: No defect or patent foramen ovale was identified. - Impressions: 3D rendering  of the Aortic and mitral valves was   performed. Note patient was intubated for procedure by Dr Glennon Mac   Anesthesia due   to low BP, coughing and need for propopofol for sedation. Impressions: - 3D rendering of the Aortic and mitral valves was performed. Note   patient was intubated for procedure by Dr Glennon Mac Anesthesia due   to low BP, coughing and need for propopofol for sedation.  Echo 07/28/17: Impressions: - Large, globular and calcified density noted in LVOT just proximal   to the aortic valve. Parasternal views suggest potential   attachment to the LVOT/annulus, however short axis views are more   consistent with attachment to the left coronary cusp. Due to   large size this appears to obstruct flow through the LVOT which   could be impacting cardiac output and therefore resulting   tachycardia. High suspicion for valvular vegetation, although   could be more chronic finding due to calcification. TEE is   indicated to evaluate further. Ordering provider out of the   office. Patient being contacted by nursing and directed to ER at   [facility] in anticipation of admission for further evaluation of   both cardiomyopathy and potential endocarditis.   Past Medical History:  Diagnosis Date  . Aneurysm ([place])     Right arm fistula 3 aneurysms 2011,   plans to have a new procedure  . Angina   . Chest discomfort   . CHF (congestive heart failure) ([place])   . Dialysis patient ([place])   . Dysrhythmia   . ESRD (end stage renal disease) ([place])    on hemodialysis T_T_S  . Hypertension   . Leg pain   . Mitral regurgitation   . Morbid obesity ([place])   . Orthostatic hypotension   . Overweight(278.02)   . Severe aortic insufficiency   . Sinus tachycardia   . Syncope     positional after dialysis... 2008    Past Surgical History:  Procedure Laterality Date  . A/V FISTULAGRAM Left 06/30/2016   Procedure: A/V Fistulagram;  Surgeon: Algernon Huxley, MD;  Location: [place] [facility];   Service: Cardiovascular;  Laterality: Left;  . ARTERIOVENOUS GRAFT PLACEMENT    . AV FISTULA PLACEMENT    . AV FISTULA PLACEMENT Left 11/18/2015   Procedure: ARTERIOVENOUS (AV) FISTULA CREATION ( RADIOCEPHALIC );  Surgeon: Algernon Huxley, MD;  Location: [facility] ORS;  Service: Vascular;  Laterality: Left;  . AV FISTULA PLACEMENT Left 03/23/2016   Procedure: ARTERIOVENOUS (AV) FISTULA CREATION ( REVISION );  Surgeon: Algernon Huxley, MD;  Location: [facility] ORS;  Service: Vascular;  Laterality: Left;  . [place] REMOVAL Left 03/23/2016   Procedure: REMOVAL OF ARTERIOVENOUS GORETEX GRAFT ([place]);  Surgeon: Algernon Huxley, MD;  Location: [facility] ORS;  Service: Vascular;  Laterality: Left;  . DIALYSIS/PERMA CATHETER REMOVAL N/A 08/01/2016   Procedure: Dialysis/Perma Catheter Removal;  Surgeon: Algernon Huxley, MD;  Location: [place] [facility] LAB;  Service: Cardiovascular;  Laterality: N/A;  . HERNIA REPAIR    . INSERTION OF DIALYSIS CATHETER  03/01/2011   Procedure: INSERTION OF DIALYSIS CATHETER;  Surgeon: Elam Dutch, MD;  Location: [place];  Service: Vascular;  Laterality: Left;  Exchange of Dialysis Catheter to  27cm 15Fr. Arrow Catheter  . PERIPHERAL VASCULAR CATHETERIZATION N/A 09/01/2014   Procedure: A/V Shuntogram/Fistulagram;  Surgeon: Algernon Huxley, MD;  Location: [place] [facility] LAB;  Service: Cardiovascular;  Laterality: N/A;  . PERIPHERAL VASCULAR CATHETERIZATION Right 09/01/2014   Procedure: Thrombectomy;  Surgeon: Algernon Huxley, MD;  Location: [place] [facility] LAB;  Service: Cardiovascular;  Laterality: Right;  . PERIPHERAL VASCULAR CATHETERIZATION Right 09/01/2014   Procedure: A/V Shunt Intervention;  Surgeon: Algernon Huxley, MD;  Location: [place] [facility] LAB;  Service: Cardiovascular;  Laterality: Right;  . PERIPHERAL VASCULAR CATHETERIZATION N/A 09/17/2014   Procedure: A/V Shuntogram/Fistulagram;  Surgeon: Algernon Huxley, MD;  Location: [place] [facility] LAB;  Service: Cardiovascular;  Laterality: N/A;  . PERIPHERAL VASCULAR  CATHETERIZATION Right 10/17/2014   Procedure: Thrombectomy;  Surgeon: Katha Cabal, MD;  Location: [place] [facility];  Service: Cardiovascular;  Laterality: Right;  . PERIPHERAL VASCULAR CATHETERIZATION N/A 10/17/2014   Procedure: A/V Shuntogram/Fistulagram;  Surgeon: Katha Cabal, MD;  Location: [place] [facility] LAB;  Service: Cardiovascular;  Laterality: N/A;  . PERIPHERAL VASCULAR CATHETERIZATION N/A 10/17/2014   Procedure: A/V Shunt Intervention;  Surgeon: Katha Cabal, MD;  Location: [place] [facility] LAB;  Service: Cardiovascular;  Laterality: N/A;  . PERIPHERAL VASCULAR CATHETERIZATION Right 11/04/2014   Procedure: Thrombectomy;  Surgeon: Katha Cabal, MD;  Location: [place] [facility] LAB;  Service: Cardiovascular;  Laterality: Right;  . PERIPHERAL VASCULAR CATHETERIZATION Left 11/04/2014   Procedure: Visceral Venography;  Surgeon: Katha Cabal, MD;  Location: [place] [facility];  Service: Cardiovascular;  Laterality: Left;  . PERIPHERAL VASCULAR CATHETERIZATION Right 11/27/2014   Procedure: A/V Shuntogram/Fistulagram;  Surgeon: Algernon Huxley, MD;  Location: [place] [facility];  Service: Cardiovascular;  Laterality: Right;  . PERIPHERAL VASCULAR CATHETERIZATION Right 11/27/2014   Procedure: A/V Shunt Intervention;  Surgeon: Algernon Huxley, MD;  Location: [place] [facility];  Service: Cardiovascular;  Laterality: Right;  . PERIPHERAL VASCULAR CATHETERIZATION Right 12/31/2014   Procedure: Thrombectomy;  Surgeon: Algernon Huxley, MD;  Location: [place] [facility] LAB;  Service: Cardiovascular;  Laterality: Right;  . PERIPHERAL VASCULAR CATHETERIZATION Right 01/12/2015   Procedure: A/V Shuntogram/Fistulagram;  Surgeon: Algernon Huxley, MD;  Location: [place] [facility];  Service: Cardiovascular;  Laterality: Right;  . PERIPHERAL VASCULAR CATHETERIZATION N/A 01/12/2015   Procedure: A/V Shunt Intervention;  Surgeon: Algernon Huxley, MD;  Location: [place] [facility] LAB;  Service: Cardiovascular;   Laterality: N/A;  . PERIPHERAL VASCULAR CATHETERIZATION Right 01/28/2015   Procedure: Thrombectomy;  Surgeon: Algernon Huxley, MD;  Location: [place] [facility] LAB;  Service: Cardiovascular;  Laterality: Right;  . PERIPHERAL VASCULAR CATHETERIZATION N/A 06/08/2015   Procedure: A/V Shuntogram/Fistulagram;  Surgeon: Algernon Huxley, MD;  Location: [place] [facility] LAB;  Service: Cardiovascular;  Laterality: N/A;  . PERIPHERAL VASCULAR CATHETERIZATION N/A 06/08/2015   Procedure: A/V Shunt Intervention;  Surgeon: Algernon Huxley, MD;  Location: [place] [facility];  Service: Cardiovascular;  Laterality: N/A;  . PERIPHERAL VASCULAR CATHETERIZATION Right 07/06/2015   Procedure: A/V Shuntogram/Fistulagram;  Surgeon: Algernon Huxley, MD;  Location: [place] [facility] LAB;  Service: Cardiovascular;  Laterality: Right;  . PERIPHERAL VASCULAR CATHETERIZATION N/A 07/06/2015   Procedure: A/V Shunt Intervention;  Surgeon: Algernon Huxley, MD;  Location: [place] [facility];  Service: Cardiovascular;  Laterality: N/A;  . PERIPHERAL VASCULAR CATHETERIZATION N/A 08/04/2015   Procedure: graft declot;  Surgeon: Katha Cabal, MD;  Location: [facility] INVASIVE CV  LAB;  Service: Cardiovascular;  Laterality: N/A;  . PERIPHERAL VASCULAR CATHETERIZATION Right 08/25/2015   Procedure: A/V Shuntogram/Fistulagram;  Surgeon: Katha Cabal, MD;  Location: [place] [facility] LAB;  Service: Cardiovascular;  Laterality: Right;  . PERIPHERAL VASCULAR CATHETERIZATION N/A 11/04/2015   Procedure: Dialysis/Perma Catheter Insertion;  Surgeon: Algernon Huxley, MD;  Location: [place] [facility] LAB;  Service: Cardiovascular;  Laterality: N/A;  . PERIPHERAL VASCULAR CATHETERIZATION Left 12/14/2015   Procedure: A/V Shuntogram/Fistulagram;  Surgeon: Algernon Huxley, MD;  Location: [place] [facility] LAB;  Service: Cardiovascular;  Laterality: Left;  . PERIPHERAL VASCULAR CATHETERIZATION N/A 12/14/2015   Procedure: A/V Shunt Intervention;  Surgeon: Algernon Huxley, MD;  Location: [place] [facility] LAB;  Service: Cardiovascular;  Laterality: N/A;  . PERIPHERAL VASCULAR CATHETERIZATION N/A 12/17/2015   Procedure: Dialysis/Perma Catheter Insertion;  Surgeon: Algernon Huxley, MD;  Location: [place] [facility];  Service: Cardiovascular;  Laterality: N/A;  . PERIPHERAL VASCULAR CATHETERIZATION Left 12/22/2015   Procedure: Dialysis/Perma Catheter Insertion;  Surgeon: Katha Cabal, MD;  Location: [place] [facility] LAB;  Service: Cardiovascular;  Laterality: Left;  . PERIPHERAL VASCULAR CATHETERIZATION Left 02/29/2016   Procedure: A/V Shuntogram/Fistulagram;  Surgeon: Algernon Huxley, MD;  Location: [place] [facility] LAB;  Service: Cardiovascular;  Laterality: Left;  . PERIPHERAL VASCULAR CATHETERIZATION N/A 02/29/2016   Procedure: A/V Shunt Intervention;  Surgeon: Algernon Huxley, MD;  Location: [place] [facility];  Service: Cardiovascular;  Laterality: N/A;  . right arm graft     for dyalisis  . RIGHT/LEFT HEART CATH AND CORONARY ANGIOGRAPHY N/A 08/02/2017   Procedure: RIGHT/LEFT HEART CATH AND CORONARY ANGIOGRAPHY;  Surgeon: Larey Dresser, MD;  Location: [place] [facility];  Service: Cardiovascular;  Laterality: N/A;  . TEE WITHOUT CARDIOVERSION N/A 07/31/2017   Procedure: TRANSESOPHAGEAL ECHOCARDIOGRAM (TEE);  Surgeon: Josue Hector, MD;  Location: [facility] ENDOSCOPY;  Service: Cardiovascular;  Laterality: N/A;  . THROMBECTOMY    . THROMBECTOMY W/ EMBOLECTOMY  03/01/2011   Procedure: THROMBECTOMY ARTERIOVENOUS GORE-TEX GRAFT;  Surgeon: Elam Dutch, MD;  Location: [place];  Service: Vascular;  Laterality: Right;  Attempted Thrombectomy of Old  Right Upper Arm Arteriovenous gortex Graft. Insertion of new Arteriovenous Graft using 71mm x 50cm Gortex Stretch graft.   . THROMBECTOMY W/ EMBOLECTOMY  07/11/2011   Procedure: THROMBECTOMY ARTERIOVENOUS GORE-TEX GRAFT;  Surgeon: Rosetta Posner, MD;  Location: [place];  Service: Vascular;  Laterality: Right;  . UMBILICAL HERNIA REPAIR    . VENOGRAM N/A  08/23/2011   Procedure: VENOGRAM;  Surgeon: [name], MD;  Location: [facility] CATH LAB;  Service: Cardiovascular;  Laterality: N/A;    MEDICATIONS: No current facility-administered medications for this encounter.    [name] apixaban (ELIQUIS) 5 MG TABS tablet  . atorvastatin (LIPITOR) 40 MG tablet  . B Complex-C-Folic Acid (NEPHRO-VITE PO)  . benzonatate (TESSALON) 100 MG capsule  . calcium acetate (PHOSLO) 667 MG capsule  . cinacalcet (SENSIPAR) 90 MG tablet  . FOSRENOL 500 MG chewable tablet  . ivabradine (CORLANOR) 5 MG TABS tablet  . metoprolol succinate (TOPROL-XL) 25 MG 24 hr tablet  . midodrine (PROAMATINE) 5 MG tablet  . nitroGLYCERIN (NITROSTAT) 0.4 MG SL tablet   [facility] Short Stay Center/Anesthesiology Phone [phone] 08/04/2017 12:29 PM

## 2017-08-04 NOTE — Progress Notes (Signed)
Anesthesia made aware of pt history and order for consult.

## 2017-08-04 NOTE — Progress Notes (Signed)
Pt denies any acute cardiopulmonary issues. Pt under the care of Dr. Jacinta Shoe, Cardiology. Pt stated that a stress test was performed > 10 years ago. Pt stated that last dose of Eliquis was Wednesday as instructed by MD. Pt made aware to stop taking vitamins, fish oil and herbal medications. Do not take any NSAIDs ie: Ibuprofen, Advil, Naproxen (Aleve), Motrin, BC and Goody powder. Pt verbalized understanding of all pre-op instructions.

## 2017-08-05 DIAGNOSIS — Z992 Dependence on renal dialysis: Secondary | ICD-10-CM | POA: Diagnosis not present

## 2017-08-05 DIAGNOSIS — N2581 Secondary hyperparathyroidism of renal origin: Secondary | ICD-10-CM | POA: Diagnosis not present

## 2017-08-05 DIAGNOSIS — N186 End stage renal disease: Secondary | ICD-10-CM | POA: Diagnosis not present

## 2017-08-05 DIAGNOSIS — D509 Iron deficiency anemia, unspecified: Secondary | ICD-10-CM | POA: Diagnosis not present

## 2017-08-05 DIAGNOSIS — E611 Iron deficiency: Secondary | ICD-10-CM | POA: Diagnosis not present

## 2017-08-06 MED ORDER — DEXTROSE 5 % IV SOLN
3.0000 g | INTRAVENOUS | Status: AC
  Administered 2017-08-07: 3 g via INTRAVENOUS
  Filled 2017-08-06: qty 3

## 2017-08-07 ENCOUNTER — Ambulatory Visit (HOSPITAL_COMMUNITY): Payer: Medicare Other | Admitting: Vascular Surgery

## 2017-08-07 ENCOUNTER — Ambulatory Visit (HOSPITAL_COMMUNITY)
Admission: RE | Admit: 2017-08-07 | Discharge: 2017-08-07 | Disposition: A | Discharge status: Home / Self Care | Payer: Medicare Other | Source: Ambulatory Visit | Attending: Dentistry | Admitting: Dentistry

## 2017-08-07 ENCOUNTER — Other Ambulatory Visit: Payer: Self-pay

## 2017-08-07 ENCOUNTER — Encounter (HOSPITAL_COMMUNITY)
Admission: RE | Disposition: A | Discharge status: Home / Self Care | Payer: Self-pay | Source: Ambulatory Visit | Attending: Dentistry

## 2017-08-07 ENCOUNTER — Encounter (HOSPITAL_COMMUNITY): Payer: Self-pay | Admitting: Surgery

## 2017-08-07 DIAGNOSIS — K0530 Chronic periodontitis, unspecified: Secondary | ICD-10-CM | POA: Diagnosis not present

## 2017-08-07 DIAGNOSIS — N185 Chronic kidney disease, stage 5: Secondary | ICD-10-CM | POA: Diagnosis not present

## 2017-08-07 DIAGNOSIS — Z992 Dependence on renal dialysis: Secondary | ICD-10-CM | POA: Insufficient documentation

## 2017-08-07 DIAGNOSIS — Z01818 Encounter for other preprocedural examination: Secondary | ICD-10-CM

## 2017-08-07 DIAGNOSIS — K029 Dental caries, unspecified: Secondary | ICD-10-CM | POA: Diagnosis present

## 2017-08-07 DIAGNOSIS — I132 Hypertensive heart and chronic kidney disease with heart failure and with stage 5 chronic kidney disease, or end stage renal disease: Secondary | ICD-10-CM | POA: Insufficient documentation

## 2017-08-07 DIAGNOSIS — I2510 Atherosclerotic heart disease of native coronary artery without angina pectoris: Secondary | ICD-10-CM | POA: Diagnosis not present

## 2017-08-07 DIAGNOSIS — I351 Nonrheumatic aortic (valve) insufficiency: Secondary | ICD-10-CM | POA: Diagnosis not present

## 2017-08-07 DIAGNOSIS — K083 Retained dental root: Secondary | ICD-10-CM

## 2017-08-07 DIAGNOSIS — Z6841 Body Mass Index (BMI) 40.0 and over, adult: Secondary | ICD-10-CM | POA: Diagnosis not present

## 2017-08-07 DIAGNOSIS — I5032 Chronic diastolic (congestive) heart failure: Secondary | ICD-10-CM | POA: Insufficient documentation

## 2017-08-07 DIAGNOSIS — K036 Deposits [accretions] on teeth: Secondary | ICD-10-CM | POA: Diagnosis present

## 2017-08-07 DIAGNOSIS — Z87891 Personal history of nicotine dependence: Secondary | ICD-10-CM | POA: Insufficient documentation

## 2017-08-07 DIAGNOSIS — K0889 Other specified disorders of teeth and supporting structures: Secondary | ICD-10-CM | POA: Diagnosis present

## 2017-08-07 DIAGNOSIS — Z79899 Other long term (current) drug therapy: Secondary | ICD-10-CM | POA: Insufficient documentation

## 2017-08-07 HISTORY — PX: MULTIPLE EXTRACTIONS WITH ALVEOLOPLASTY: SHX5342

## 2017-08-07 LAB — POCT I-STAT 4, (NA,K, GLUC, HGB,HCT)
GLUCOSE: 76 mg/dL (ref 65–99)
HCT: 42 % (ref 39.0–52.0)
HEMOGLOBIN: 14.3 g/dL (ref 13.0–17.0)
POTASSIUM: 4 mmol/L (ref 3.5–5.1)
Sodium: 141 mmol/L (ref 135–145)

## 2017-08-07 SURGERY — MULTIPLE EXTRACTION WITH ALVEOLOPLASTY
Anesthesia: General | Site: Mouth

## 2017-08-07 MED ORDER — CISATRACURIUM BESYLATE (PF) 10 MG/5ML IV SOLN
INTRAVENOUS | Status: DC | PRN
  Administered 2017-08-07: 20 mg via INTRAVENOUS

## 2017-08-07 MED ORDER — METOCLOPRAMIDE HCL 5 MG/ML IJ SOLN
INTRAMUSCULAR | Status: AC
  Filled 2017-08-07: qty 2

## 2017-08-07 MED ORDER — DEXAMETHASONE SODIUM PHOSPHATE 10 MG/ML IJ SOLN
INTRAMUSCULAR | Status: AC
  Filled 2017-08-07: qty 2

## 2017-08-07 MED ORDER — EPHEDRINE 5 MG/ML INJ
INTRAVENOUS | Status: AC
  Filled 2017-08-07: qty 10

## 2017-08-07 MED ORDER — ETOMIDATE 2 MG/ML IV SOLN
INTRAVENOUS | Status: DC | PRN
  Administered 2017-08-07: 20 mg via INTRAVENOUS

## 2017-08-07 MED ORDER — CISATRACURIUM BESYLATE 20 MG/10ML IV SOLN
INTRAVENOUS | Status: AC
  Filled 2017-08-07: qty 20

## 2017-08-07 MED ORDER — PHENYLEPHRINE 40 MCG/ML (10ML) SYRINGE FOR IV PUSH (FOR BLOOD PRESSURE SUPPORT)
PREFILLED_SYRINGE | INTRAVENOUS | Status: AC
  Filled 2017-08-07: qty 20

## 2017-08-07 MED ORDER — PROMETHAZINE HCL 25 MG/ML IJ SOLN: 6.2500 mg | INTRAMUSCULAR | Status: DC | PRN

## 2017-08-07 MED ORDER — SODIUM CHLORIDE 0.9 % IV SOLN
INTRAVENOUS | Status: DC
  Administered 2017-08-07: 10:00:00 via INTRAVENOUS

## 2017-08-07 MED ORDER — AMINOCAPROIC ACID SOLUTION 5% (50 MG/ML)
10.0000 mL | ORAL | Status: DC
  Administered 2017-08-07: 100 mL via ORAL
  Filled 2017-08-07: qty 100

## 2017-08-07 MED ORDER — GLYCOPYRROLATE 0.2 MG/ML IJ SOLN
INTRAMUSCULAR | Status: DC | PRN
  Administered 2017-08-07: .6 mg via INTRAVENOUS

## 2017-08-07 MED ORDER — MEPERIDINE HCL 50 MG/ML IJ SOLN: 6.2500 mg | INTRAMUSCULAR | Status: DC | PRN

## 2017-08-07 MED ORDER — NEOSTIGMINE METHYLSULFATE 10 MG/10ML IV SOLN
INTRAVENOUS | Status: DC | PRN
  Administered 2017-08-07: 4 mg via INTRAVENOUS

## 2017-08-07 MED ORDER — ONDANSETRON HCL 4 MG/2ML IJ SOLN
INTRAMUSCULAR | Status: AC
  Filled 2017-08-07: qty 4

## 2017-08-07 MED ORDER — OXYCODONE-ACETAMINOPHEN 5-325 MG PO TABS: 1.0000 | ORAL_TABLET | Freq: Four times a day (QID) | ORAL | Status: DC | PRN

## 2017-08-07 MED ORDER — MIDAZOLAM HCL 5 MG/5ML IJ SOLN
INTRAMUSCULAR | Status: DC | PRN
  Administered 2017-08-07: 1 mg via INTRAVENOUS

## 2017-08-07 MED ORDER — HEPARIN SODIUM (PORCINE) 1000 UNIT/ML IJ SOLN
INTRAMUSCULAR | Status: AC
  Filled 2017-08-07: qty 1

## 2017-08-07 MED ORDER — OXYCODONE-ACETAMINOPHEN 5-325 MG PO TABS: ORAL_TABLET | ORAL | 0 refills | Status: DC

## 2017-08-07 MED ORDER — LIDOCAINE 2% (20 MG/ML) 5 ML SYRINGE
INTRAMUSCULAR | Status: AC
  Filled 2017-08-07: qty 10

## 2017-08-07 MED ORDER — NEOSTIGMINE METHYLSULFATE 5 MG/5ML IV SOSY
PREFILLED_SYRINGE | INTRAVENOUS | Status: AC
  Filled 2017-08-07: qty 5

## 2017-08-07 MED ORDER — FENTANYL CITRATE (PF) 100 MCG/2ML IJ SOLN
INTRAMUSCULAR | Status: DC | PRN
  Administered 2017-08-07: 50 ug via INTRAVENOUS

## 2017-08-07 MED ORDER — DOPAMINE-DEXTROSE 3.2-5 MG/ML-% IV SOLN
0.0000 ug/kg/min | INTRAVENOUS | Status: DC
  Filled 2017-08-07: qty 250

## 2017-08-07 MED ORDER — NOREPINEPHRINE BITARTRATE 1 MG/ML IV SOLN
0.0000 ug/min | INTRAVENOUS | Status: DC
  Filled 2017-08-07: qty 4

## 2017-08-07 MED ORDER — MIDAZOLAM HCL 2 MG/2ML IJ SOLN: 0.5000 mg | Freq: Once | INTRAMUSCULAR | Status: DC | PRN

## 2017-08-07 MED ORDER — PHENYLEPHRINE HCL 10 MG/ML IJ SOLN: INTRAMUSCULAR | Status: DC | PRN

## 2017-08-07 MED ORDER — LIDOCAINE HCL (CARDIAC) PF 100 MG/5ML IV SOSY
PREFILLED_SYRINGE | INTRAVENOUS | Status: DC | PRN
  Administered 2017-08-07: 100 mg via INTRAVENOUS

## 2017-08-07 MED ORDER — ESMOLOL HCL 100 MG/10ML IV SOLN
INTRAVENOUS | Status: AC
  Filled 2017-08-07: qty 10

## 2017-08-07 MED ORDER — OXYMETAZOLINE HCL 0.05 % NA SOLN
NASAL | Status: DC | PRN
  Administered 2017-08-07 (×2): 2 via NASAL

## 2017-08-07 MED ORDER — OXYMETAZOLINE HCL 0.05 % NA SOLN
NASAL | Status: AC
  Filled 2017-08-07: qty 15

## 2017-08-07 MED ORDER — LIDOCAINE-EPINEPHRINE 2 %-1:100000 IJ SOLN
INTRAMUSCULAR | Status: AC
  Filled 2017-08-07: qty 10.2

## 2017-08-07 MED ORDER — LIDOCAINE-EPINEPHRINE 2 %-1:100000 IJ SOLN
INTRAMUSCULAR | Status: DC | PRN
  Administered 2017-08-07: 10.8 mL via INTRADERMAL

## 2017-08-07 MED ORDER — HEMOSTATIC AGENTS (NO CHARGE) OPTIME
TOPICAL | Status: DC | PRN
  Administered 2017-08-07: 1 via TOPICAL

## 2017-08-07 MED ORDER — LACTATED RINGERS IV SOLN: INTRAVENOUS | Status: DC | PRN

## 2017-08-07 MED ORDER — 0.9 % SODIUM CHLORIDE (POUR BTL) OPTIME
TOPICAL | Status: DC | PRN
  Administered 2017-08-07: 1000 mL

## 2017-08-07 MED ORDER — FENTANYL CITRATE (PF) 250 MCG/5ML IJ SOLN
INTRAMUSCULAR | Status: AC
  Filled 2017-08-07: qty 5

## 2017-08-07 MED ORDER — PHENYLEPHRINE HCL 10 MG/ML IJ SOLN
INTRAMUSCULAR | Status: DC | PRN
  Administered 2017-08-07: 50 ug/min via INTRAVENOUS

## 2017-08-07 MED ORDER — PROTAMINE SULFATE 10 MG/ML IV SOLN
INTRAVENOUS | Status: AC
Start: 2017-08-07 — End: ?
  Filled 2017-08-07: qty 5

## 2017-08-07 MED ORDER — ONDANSETRON HCL 4 MG/2ML IJ SOLN
INTRAMUSCULAR | Status: DC | PRN
  Administered 2017-08-07: 4 mg via INTRAVENOUS

## 2017-08-07 MED ORDER — BUPIVACAINE-EPINEPHRINE 0.5% -1:200000 IJ SOLN
INTRAMUSCULAR | Status: DC | PRN
  Administered 2017-08-07: 3.6 mL

## 2017-08-07 MED ORDER — MIDAZOLAM HCL 2 MG/2ML IJ SOLN
INTRAMUSCULAR | Status: AC
  Filled 2017-08-07: qty 2

## 2017-08-07 MED ORDER — DEXAMETHASONE SODIUM PHOSPHATE 10 MG/ML IJ SOLN
INTRAMUSCULAR | Status: DC | PRN
  Administered 2017-08-07: 8 mg via INTRAVENOUS

## 2017-08-07 MED ORDER — ETOMIDATE 2 MG/ML IV SOLN
INTRAVENOUS | Status: AC
  Filled 2017-08-07: qty 20

## 2017-08-07 MED ORDER — FENTANYL CITRATE (PF) 100 MCG/2ML IJ SOLN: 25.0000 ug | INTRAMUSCULAR | Status: DC | PRN

## 2017-08-07 MED ORDER — PHENYLEPHRINE HCL 10 MG/ML IJ SOLN
INTRAMUSCULAR | Status: DC | PRN
  Administered 2017-08-07: 80 ug via INTRAVENOUS

## 2017-08-07 MED ORDER — BUPIVACAINE-EPINEPHRINE (PF) 0.5% -1:200000 IJ SOLN
INTRAMUSCULAR | Status: AC
  Filled 2017-08-07: qty 3.6

## 2017-08-07 SURGICAL SUPPLY — 34 items
ALCOHOL 70% 16 OZ (MISCELLANEOUS) ×2 IMPLANT
ATTRACTOMAT 16X20 MAGNETIC DRP (DRAPES) ×2 IMPLANT
BLADE SURG 15 STRL LF DISP TIS (BLADE) ×2 IMPLANT
BLADE SURG 15 STRL SS (BLADE) ×4
COVER SURGICAL LIGHT HANDLE (MISCELLANEOUS) ×2 IMPLANT
GAUZE PACKING FOLDED 2  STR (GAUZE/BANDAGES/DRESSINGS) ×1
GAUZE PACKING FOLDED 2 STR (GAUZE/BANDAGES/DRESSINGS) ×1 IMPLANT
GAUZE SPONGE 4X4 16PLY XRAY LF (GAUZE/BANDAGES/DRESSINGS) ×2 IMPLANT
GLOVE BIO SURGEON STRL SZ 6.5 (GLOVE) ×2 IMPLANT
GLOVE SURG ORTHO 8.0 STRL STRW (GLOVE) ×2 IMPLANT
GOWN STRL REUS W/ TWL LRG LVL3 (GOWN DISPOSABLE) ×1 IMPLANT
GOWN STRL REUS W/TWL 2XL LVL3 (GOWN DISPOSABLE) ×2 IMPLANT
GOWN STRL REUS W/TWL LRG LVL3 (GOWN DISPOSABLE) ×2
HEMOSTAT SURGICEL 2X14 (HEMOSTASIS) IMPLANT
KIT BASIN OR (CUSTOM PROCEDURE TRAY) ×2 IMPLANT
KIT TURNOVER KIT B (KITS) ×2 IMPLANT
MANIFOLD NEPTUNE WASTE (CANNULA) ×2 IMPLANT
NEEDLE BLUNT 16X1.5 OR ONLY (NEEDLE) ×2 IMPLANT
NEEDLE DENTAL 27 LONG (NEEDLE) ×4 IMPLANT
NS IRRIG 1000ML POUR BTL (IV SOLUTION) ×2 IMPLANT
PACK EENT II TURBAN DRAPE (CUSTOM PROCEDURE TRAY) ×2 IMPLANT
PAD ARMBOARD 7.5X6 YLW CONV (MISCELLANEOUS) ×2 IMPLANT
SPONGE SURGIFOAM ABS GEL 100 (HEMOSTASIS) ×2 IMPLANT
SPONGE SURGIFOAM ABS GEL 12-7 (HEMOSTASIS) IMPLANT
SPONGE SURGIFOAM ABS GEL SZ50 (HEMOSTASIS) IMPLANT
SUCTION FRAZIER HANDLE 10FR (MISCELLANEOUS) ×1
SUCTION TUBE FRAZIER 10FR DISP (MISCELLANEOUS) ×1 IMPLANT
SUT CHROMIC 3 0 PS 2 (SUTURE) ×6 IMPLANT
SUT CHROMIC 4 0 P 3 18 (SUTURE) IMPLANT
SYR 50ML SLIP (SYRINGE) ×2 IMPLANT
TUBE CONNECTING 12X1/4 (SUCTIONS) ×2 IMPLANT
WATER STERILE IRR 1000ML POUR (IV SOLUTION) ×2 IMPLANT
WATER TABLETS ICX (MISCELLANEOUS) ×2 IMPLANT
YANKAUER SUCT BULB TIP NO VENT (SUCTIONS) ×2 IMPLANT

## 2017-08-07 NOTE — Anesthesia Postprocedure Evaluation (Signed)
Anesthesia Post Note  Patient: [name]  Procedure(s) Performed: Extraction of tooth #2, [phone], and 31 with alveoloplasty and gross debridement of remaining teeth (N/A Mouth)     Patient location during evaluation: PACU Anesthesia Type: General Level of consciousness: awake and alert, oriented and patient cooperative Pain management: pain level controlled Vital Signs Assessment: post-procedure vital signs reviewed and stable Respiratory status: spontaneous breathing, nonlabored ventilation and respiratory function stable Cardiovascular status: blood pressure returned to baseline and stable Postop Assessment: no apparent nausea or vomiting Anesthetic complications: no    Last Vitals:  Vitals:   08/07/17 1200 08/07/17 1208  BP:  (!) 93/56  Pulse: (!) 103 (!) 102  Resp: (!) 23 16  Temp:  36.7 C  SpO2: 94% 94%    Last Pain:  Vitals:   08/07/17 1200  TempSrc:   PainSc: 0-No pain                 Valgene Deloatch,E. Lennyx Verdell

## 2017-08-07 NOTE — Anesthesia Procedure Notes (Signed)
Performed by: Lashuna Tamashiro, CRNA       

## 2017-08-07 NOTE — Transfer of Care (Signed)
Immediate Anesthesia Transfer of Care Note  Patient: [name] [name]  Procedure(s) Performed: Extraction of tooth #2, [phone], and 31 with alveoloplasty and gross debridement of remaining teeth (N/A Mouth)  Patient Location: PACU  Anesthesia Type:General  Level of Consciousness: awake, alert , oriented and patient cooperative  Airway & Oxygen Therapy: Patient Spontanous Breathing and Patient connected to face mask oxygen  Post-op Assessment: Report given to RN and Post -op Vital signs reviewed and stable  Post vital signs: Reviewed and stable  Last Vitals:  Vitals Value Taken Time  BP 94/58 08/07/2017 11:39 AM  Temp 36.7 C 08/07/2017 11:38 AM  Pulse 96 08/07/2017 11:42 AM  Resp 30 08/07/2017 11:42 AM  SpO2 91 % 08/07/2017 11:42 AM  Vitals shown include unvalidated device data.  Last Pain:  Vitals:   08/07/17 1138  TempSrc:   PainSc: Asleep      Patients Stated Pain Goal: 3 (02/18/14 9458)  Complications: No apparent anesthesia complications

## 2017-08-07 NOTE — Progress Notes (Signed)
PRE-OPERATIVE NOTE:  08/07/2017 [name] [name] [id number]  VITALS: BP (!) 115/59   Pulse (!) 110   Temp 97.6 F (36.4 C) (Oral)   Resp 20   Ht 6' (1.829 m)   Wt (!) 314 lb (142.4 kg)   SpO2 100%   BMI 42.59 kg/m   Lab Results  Component Value Date   WBC 7.6 08/03/2017   HGB 12.4 (L) 08/03/2017   HCT 39.7 08/03/2017   MCV 81.7 08/03/2017   PLT 235 08/03/2017   BMET    Component Value Date/Time   NA 136 08/03/2017 0250   NA 137 11/13/2013 0408   K 4.3 08/03/2017 0250   K 4.1 11/13/2013 0408   CL 98 (L) 08/03/2017 0250   CL 98 11/13/2013 0408   CO2 24 08/03/2017 0250   CO2 25 11/13/2013 0408   GLUCOSE 83 08/03/2017 0250   GLUCOSE 64 (L) 11/13/2013 0408   BUN 40 (H) 08/03/2017 0250   BUN 68 (H) 11/13/2013 0408   CREATININE 10.24 (H) 08/03/2017 0250   CREATININE 16.45 (H) 11/13/2013 0408   CALCIUM 8.6 (L) 08/03/2017 0250   CALCIUM 6.8 (LL) 11/13/2013 0408   GFRNONAA 5 (L) 08/03/2017 0250   GFRNONAA 3 (L) 11/13/2013 0408   GFRAA 6 (L) 08/03/2017 0250   GFRAA 3 (L) 11/13/2013 0408    Lab Results  Component Value Date   INR 1.38 08/01/2017   INR 1.26 03/16/2016   INR 1.25 11/13/2015   No results found for: PTT   [name] [name] presents for multiple dental extractions of indicated teeth with alveoloplasty and gross debridement of remaining dentition in the operative room with general anesthesia.  The patient understands that the patient will be examined in the operating room under general anesthesia to determine the exact teeth that need to be extracted at this time. Patient understands and agrees to this treatment plan.   SUBJECTIVE: The patient denies any acute medical or dental changes and agrees to proceed with treatment as planned.  EXAM: No sign of acute dental changes.  ASSESSMENT: Patient is affected by multiple retained root segments, dental caries, chronic periodontitis, and accretions.  PLAN: Patient agrees to proceed with treatment as planned in  the operating room as previously discussed and accepts the risks, benefits, and complications of the proposed treatment. Patient is aware of the risk for bleeding, bruising, swelling, infection, pain, nerve damage, soft tissue damage, damage to adjacent teeth,sinus involvement, root tip fracture, mandible fracture, and the risks of complications associated with the anesthesia. Patient also is aware of the potential for other complications up to and including death due to his overall cardiovascular compromise.  [name], DDS

## 2017-08-07 NOTE — Anesthesia Preprocedure Evaluation (Addendum)
Anesthesia Evaluation  Patient identified by MRN, date of birth, ID band Patient awake    Reviewed: Allergy & Precautions, NPO status , Patient's Chart, lab work & pertinent test results, reviewed documented beta blocker date and time   History of Anesthesia Complications Negative for: history of anesthetic complications  Airway Mallampati: I  TM Distance: >3 FB Neck ROM: Full    Dental  (+) Dental Advisory Given   Pulmonary former smoker (quit 1996),    breath sounds clear to auscultation       Cardiovascular hypertension, Pt. on medications and Pt. on home beta blockers + angina + CAD (40% mid LAD stenosis. Small D2 with 95% ostial stenosis. Small D3 with 70% mid vessel stenosis.)  + Valvular Problems/Murmurs AI  Rhythm:Regular Rate:Normal + Systolic murmurs 0/17/49 ECHO: EF 25% to 30%. Diffuse hypokinesis. - Aortic valve: trileaflet and severely thickened, mild stenosis, severe regurgitation. - Mitral valve: Moderately to severely calcified annulus.   Moderately thickened, moderately calcified leaflets, moderate regurgitation.   Neuro/Psych negative neurological ROS     GI/Hepatic negative GI ROS, Neg liver ROS,   Endo/Other  Morbid obesity  Renal/GU ESRF and DialysisRenal disease (TuThSa, K+ 4.0)     Musculoskeletal   Abdominal (+) + obese,   Peds  Hematology Eliquis, last dose 08/02/17   Anesthesia Other Findings   Reproductive/Obstetrics                            Anesthesia Physical Anesthesia Plan  ASA: IV  Anesthesia Plan: General   Post-op Pain Management:    Induction: Intravenous  PONV Risk Score and Plan: 2 and Ondansetron and Dexamethasone  Airway Management Planned: Nasal ETT  Additional Equipment:   Intra-op Plan:   Post-operative Plan: Extubation in OR  Informed Consent: I have reviewed the patients History and Physical, chart, labs and discussed the procedure  including the risks, benefits and alternatives for the proposed anesthesia with the patient or authorized representative who has indicated his/her understanding and acceptance.   Dental advisory given  Plan Discussed with: CRNA and Surgeon  Anesthesia Plan Comments: (Plan routine monitors, GETA)       Anesthesia Quick Evaluation

## 2017-08-07 NOTE — H&P (Signed)
08/07/2017  Patient:            [name] [name] Date of Birth:  [birth year] MRN:                [id number]   BP (!) 115/59   Pulse (!) 110   Temp 97.6 F (36.4 C) (Oral)   Resp 20   Ht 6' (1.829 m)   Wt (!) 314 lb (142.4 kg)   SpO2 100%   BMI 42.59 kg/m    [name] [name] is a 52 yo male that presents for multiple dental extractions with alveoloplasty and gross debris remaining dentition the operating with general anesthesia. The patient denies any acute medical or dental changes. Patient discontinued use of Eliquis on Friday, 08/04/2017. Patient is to discontinue permanently per report of [name] [name] and [name] [name]. Please see note from [name] [name] on 08/01/2017 act as H&P for the dental operating room procedure.  [name] [name], DDS   [name] Alberts, MD  Physician  Cardiothoracic Surgery    Consult Note  Addendum     Date of Service:  08/01/2017  1:25 PM   Expand All Collapse All   [address]        [place] [address]              [phone]                         CARDIOTHORACIC SURGERY CONSULTATION REPORT     PCP is System, Provider Not In  Referring Provider is [name] Dresser, MD  Primary Cardiologist is [name] Commons, MD  Primary Nephrologist is [name] Lowes, MD     Reason for consultation:  Severe aortic insufficiency     HPI:     Patient is a 52 year old morbidly obese African-American male with history of hypertension, chronic diastolic congestive heart failure, and 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     The patient states that he has long-standing history of severe hypertension.  Approximately 20 years ago he began to develop symptoms of chronic diastolic congestive heart failure in the setting of progressive renal failure.  He  ultimately became dialysis dependent approximately 17 years ago.  An echocardiogram performed 12 years ago revealed normal left ventricular systolic function with no mention of any significant valvular disease.  Recently the patient developed persistent dry nonproductive cough and orthopnea.  He was referred for cardiology consultation and evaluated by [name] [name] 2 weeks ago.  Transthoracic echocardiogram performed April 29, 2017 revealed severe left ventricular systolic dysfunction with ejection fraction estimated 20-25%.  There was at least mild to moderate aortic insufficiency and question of possible vegetation beneath the aortic valve in the left ventricular outflow tract.  There was mild mitral regurgitation.  The patient was contacted via telephone and brought in for hospital admission for further workup and therapy.  Multiple sets of blood cultures were obtained and remain no growth to date.  Transesophageal echocardiogram performed July 31, 2017 revealed severe aortic insufficiency with no clear evidence of vegetation.  There was moderate mitral regurgitation.  There was severe left ventricular systolic dysfunction with ejection fraction estimated 25-30%.  There was significant left ventricular hypertrophy.  Left atrium was dilated.  Cardiothoracic surgical consultation was requested.  Diagnostic cardiac catheterization has been scheduled but not yet performed.  Patient is married and lives with his wife in [place].  He has been disabled since he went on hemodialysis approximately 17 years ago.  He still drives an automobile and remains reasonably functional.  He has been morbidly obese for all of his adult life.  He states that he ambulates without significant difficulty.  He denies any symptoms of shortness of breath.  He has not been having problems with dialysis treatments recently.  He denies any history of fevers, chills, or night sweats.  Appetite is stable.  He complains of a dry  persistent cough that is made worse laying flat in bed.  He has had episodes of PND.  He denies palpitations, dizzy spells, or syncope.  He has not had lower extremity edema.  He dialyzes on a Tuesday, Thursday, Saturday schedule at the [facility] dialysis center via a left forearm primary AV fistula.  He has not had any problems with dialysis access for the past 2 years.  He has been anticoagulated chronically using Eliquis because of previous history of clotting of dialysis grafts.          Past Medical History:    Diagnosis   Date    .   Aneurysm ([place])             Right arm fistula 3 aneurysms 2011,   plans to have a new procedure    .   Angina            occasional, last 6 mo ago    .   Chest discomfort             2008    .   CHF (congestive heart failure) ([place])        .   Dialysis patient [facility])            M-W-F @ [facility]    .   Dysrhythmia        .   Ejection fraction             65%, echo, 2008   /   EF 55-60%, echo, October 28, 2010    .   ESRD (end stage renal disease) ([place])            on hemodialysis T_T_S    .   Hypertension        .   Leg pain        .   Orthostatic hypotension             2008    .   Overweight(278.02)        .   Sinus tachycardia             2008, TSH normal ... happens frequently with heart rate at 120-130    .   Syncope             positional after dialysis... 2008                Past Surgical History:    Procedure   Laterality   Date    .   A/V FISTULAGRAM   Left   06/30/2016        Procedure: A/V Fistulagram;  Surgeon: [name] Huxley, MD;  Location: [place] [facility];  Service: Cardiovascular;  Laterality: Left;    .   ARTERIOVENOUS GRAFT PLACEMENT            .   AV FISTULA PLACEMENT            .  AV FISTULA PLACEMENT   Left   11/18/2015        Procedure: ARTERIOVENOUS (AV)  FISTULA CREATION ( RADIOCEPHALIC );  Surgeon: [name] Huxley, MD;  Location: [facility];  Service: Vascular;  Laterality: Left;    .   AV FISTULA PLACEMENT   Left   03/23/2016        Procedure: ARTERIOVENOUS (AV) FISTULA CREATION ( REVISION );  Surgeon: [name] Huxley, MD;  Location: [facility];  Service: Vascular;  Laterality: Left;    .   [place] REMOVAL   Left   03/23/2016        Procedure: REMOVAL OF ARTERIOVENOUS GORETEX GRAFT ([place]);  Surgeon: [name] Huxley, MD;  Location: [facility];  Service: Vascular;  Laterality: Left;    .   DIALYSIS/PERMA CATHETER REMOVAL   N/A   08/01/2016        Procedure: Dialysis/Perma Catheter Removal;  Surgeon: [name] Huxley, MD;  Location: [place] [facility];  Service: Cardiovascular;  Laterality: N/A;    .   HERNIA REPAIR            .   INSERTION OF DIALYSIS CATHETER       03/01/2011        Procedure: INSERTION OF DIALYSIS CATHETER;  Surgeon: Elam Dutch, MD;  Location: [place];  Service: Vascular;  Laterality: Left;  Exchange of Dialysis Catheter to 27cm 15Fr. Arrow Catheter    .   PERIPHERAL VASCULAR CATHETERIZATION   N/A   09/01/2014        Procedure: A/V Shuntogram/Fistulagram;  Surgeon: [name] Huxley, MD;  Location: [place] [facility];  Service: Cardiovascular;  Laterality: N/A;    .   PERIPHERAL VASCULAR CATHETERIZATION   Right   09/01/2014        Procedure: Thrombectomy;  Surgeon: [name] Huxley, MD;  Location: [place] [facility];  Service: Cardiovascular;  Laterality: Right;    .   PERIPHERAL VASCULAR CATHETERIZATION   Right   09/01/2014        Procedure: A/V Shunt Intervention;  Surgeon: [name] Huxley, MD;  Location: [place] [facility];  Service: Cardiovascular;  Laterality: Right;    .   PERIPHERAL VASCULAR CATHETERIZATION   N/A   09/17/2014        Procedure: A/V Shuntogram/Fistulagram;  Surgeon: [name] Huxley, MD;  Location: [place] [facility];  Service: Cardiovascular;   Laterality: N/A;    .   PERIPHERAL VASCULAR CATHETERIZATION   Right   10/17/2014        Procedure: Thrombectomy;  Surgeon: Katha Cabal, MD;  Location: [place] [facility];  Service: Cardiovascular;  Laterality: Right;    .   PERIPHERAL VASCULAR CATHETERIZATION   N/A   10/17/2014        Procedure: A/V Shuntogram/Fistulagram;  Surgeon: Katha Cabal, MD;  Location: [place] [facility];  Service: Cardiovascular;  Laterality: N/A;    .   PERIPHERAL VASCULAR CATHETERIZATION   N/A   10/17/2014        Procedure: A/V Shunt Intervention;  Surgeon: Katha Cabal, MD;  Location: [place] [facility];  Service: Cardiovascular;  Laterality: N/A;    .   PERIPHERAL VASCULAR CATHETERIZATION   Right   11/04/2014        Procedure: Thrombectomy;  Surgeon: Katha Cabal, MD;  Location: [place] [facility];  Service: Cardiovascular;  Laterality: Right;    .   PERIPHERAL VASCULAR CATHETERIZATION  Left   11/04/2014        Procedure: Visceral Venography;  Surgeon: Katha Cabal, MD;  Location: [place] [facility];  Service: Cardiovascular;  Laterality: Left;    .   PERIPHERAL VASCULAR CATHETERIZATION   Right   11/27/2014        Procedure: A/V Shuntogram/Fistulagram;  Surgeon: [name] Huxley, MD;  Location: [place] [facility];  Service: Cardiovascular;  Laterality: Right;    .   PERIPHERAL VASCULAR CATHETERIZATION   Right   11/27/2014        Procedure: A/V Shunt Intervention;  Surgeon: [name] Huxley, MD;  Location: [place] [facility];  Service: Cardiovascular;  Laterality: Right;    .   PERIPHERAL VASCULAR CATHETERIZATION   Right   12/31/2014        Procedure: Thrombectomy;  Surgeon: [name] Huxley, MD;  Location: [place] [facility];  Service: Cardiovascular;  Laterality: Right;    .   PERIPHERAL VASCULAR CATHETERIZATION   Right   01/12/2015        Procedure: A/V Shuntogram/Fistulagram;  Surgeon: [name] Huxley, MD;  Location: [place] [facility];  Service: Cardiovascular;  Laterality: Right;    .   PERIPHERAL VASCULAR CATHETERIZATION   N/A   01/12/2015        Procedure: A/V Shunt Intervention;  Surgeon: [name] Huxley, MD;  Location: [place] [facility];  Service: Cardiovascular;  Laterality: N/A;    .   PERIPHERAL VASCULAR CATHETERIZATION   Right   01/28/2015        Procedure: Thrombectomy;  Surgeon: [name] Huxley, MD;  Location: [place] [facility];  Service: Cardiovascular;  Laterality: Right;    .   PERIPHERAL VASCULAR CATHETERIZATION   N/A   06/08/2015        Procedure: A/V Shuntogram/Fistulagram;  Surgeon: [name] Huxley, MD;  Location: [place] [facility];  Service: Cardiovascular;  Laterality: N/A;    .   PERIPHERAL VASCULAR CATHETERIZATION   N/A   06/08/2015        Procedure: A/V Shunt Intervention;  Surgeon: [name] Huxley, MD;  Location: [place] [facility];  Service: Cardiovascular;  Laterality: N/A;    .   PERIPHERAL VASCULAR CATHETERIZATION   Right   07/06/2015        Procedure: A/V Shuntogram/Fistulagram;  Surgeon: [name] Huxley, MD;  Location: [place] [facility];  Service: Cardiovascular;  Laterality: Right;    .   PERIPHERAL VASCULAR CATHETERIZATION   N/A   07/06/2015        Procedure: A/V Shunt Intervention;  Surgeon: [name] Huxley, MD;  Location: [place] [facility];  Service: Cardiovascular;  Laterality: N/A;    .   PERIPHERAL VASCULAR CATHETERIZATION   N/A   08/04/2015        Procedure: graft declot;  Surgeon: Katha Cabal, MD;  Location: [place] [facility];  Service: Cardiovascular;  Laterality: N/A;    .   PERIPHERAL VASCULAR CATHETERIZATION   Right   08/25/2015        Procedure: A/V Shuntogram/Fistulagram;  Surgeon: Katha Cabal, MD;  Location: [place] [facility];  Service: Cardiovascular;  Laterality: Right;    .   PERIPHERAL VASCULAR CATHETERIZATION   N/A   11/04/2015         Procedure: Dialysis/Perma Catheter Insertion;  Surgeon: [name] Huxley, MD;  Location: [place] [facility];  Service: Cardiovascular;  Laterality: N/A;    .  PERIPHERAL VASCULAR CATHETERIZATION   Left   12/14/2015        Procedure: A/V Shuntogram/Fistulagram;  Surgeon: [name] Huxley, MD;  Location: [place] [facility];  Service: Cardiovascular;  Laterality: Left;    .   PERIPHERAL VASCULAR CATHETERIZATION   N/A   12/14/2015        Procedure: A/V Shunt Intervention;  Surgeon: [name] Huxley, MD;  Location: [place] [facility];  Service: Cardiovascular;  Laterality: N/A;    .   PERIPHERAL VASCULAR CATHETERIZATION   N/A   12/17/2015        Procedure: Dialysis/Perma Catheter Insertion;  Surgeon: [name] Huxley, MD;  Location: [place] [facility];  Service: Cardiovascular;  Laterality: N/A;    .   PERIPHERAL VASCULAR CATHETERIZATION   Left   12/22/2015        Procedure: Dialysis/Perma Catheter Insertion;  Surgeon: Katha Cabal, MD;  Location: [place] [facility];  Service: Cardiovascular;  Laterality: Left;    .   PERIPHERAL VASCULAR CATHETERIZATION   Left   02/29/2016        Procedure: A/V Shuntogram/Fistulagram;  Surgeon: [name] Huxley, MD;  Location: [place] [facility];  Service: Cardiovascular;  Laterality: Left;    .   PERIPHERAL VASCULAR CATHETERIZATION   N/A   02/29/2016        Procedure: A/V Shunt Intervention;  Surgeon: [name] Huxley, MD;  Location: [place] [facility];  Service: Cardiovascular;  Laterality: N/A;    .   right arm graft                for dyalisis    .   TEE WITHOUT CARDIOVERSION   N/A   07/31/2017        Procedure: TRANSESOPHAGEAL ECHOCARDIOGRAM (TEE);  Surgeon: Josue Hector, MD;  Location: [facility];  Service: Cardiovascular;  Laterality: N/A;    .   THROMBECTOMY            .   THROMBECTOMY W/ EMBOLECTOMY       03/01/2011        Procedure: THROMBECTOMY  ARTERIOVENOUS GORE-TEX GRAFT;  Surgeon: Elam Dutch, MD;  Location: [place];  Service: Vascular;  Laterality: Right;  Attempted Thrombectomy of Old  Right Upper Arm Arteriovenous gortex Graft. Insertion of new Arteriovenous Graft using 78mm x 50cm Gortex Stretch graft.     .   THROMBECTOMY W/ EMBOLECTOMY       07/11/2011        Procedure: THROMBECTOMY ARTERIOVENOUS GORE-TEX GRAFT;  Surgeon: Rosetta Posner, MD;  Location: [place];  Service: Vascular;  Laterality: Right;    .   UMBILICAL HERNIA REPAIR            .   VENOGRAM   N/A   08/23/2011        Procedure: VENOGRAM;  Surgeon: [name], MD;  Location: [facility];  Service: Cardiovascular;  Laterality: N/A;                Family History    Problem   Relation   Age of Onset    .   Hypertension   Mother        .   Heart disease   Mother        .   Hypertension   Father        .   Heart disease   Father  Social History             Socioeconomic History    .   Marital status:   Married            Spouse name:   Not on file    .   Number of children:   Not on file    .   Years of education:   Not on file    .   Highest education level:   Not on file    Occupational History    .   Not on file    Social Needs    .   Financial resource strain:   Not on file    .   Food insecurity:            Worry:   Not on file            Inability:   Not on file    .   Transportation needs:            Medical:   Not on file            Non-medical:   Not on file    Tobacco Use    .   Smoking status:   Former Smoker            Types:   Cigarettes            Last attempt to quit:   04/18/1994            Years since quitting:   23.3    .   Smokeless tobacco:   Current User            Types:   Snuff    .   Tobacco  comment: dips 1/2 snuff per day times 25 years    Substance and Sexual Activity    .   Alcohol use:   No    .   Drug use:   No    .   Sexual activity:   Not on file    Lifestyle    .   Physical activity:            Days per week:   Not on file            Minutes per session:   Not on file    .   Stress:   Not on file    Relationships    .   Social connections:            Talks on phone:   Not on file            Gets together:   Not on file            Attends religious service:   Not on file            Active member of club or organization:   Not on file            Attends meetings of clubs or organizations:   Not on file            Relationship status:   Not on file    .   Intimate partner violence:            Fear of current or ex partner:   Not on file            Emotionally abused:   Not on file  Physically abused:   Not on file            Forced sexual activity:   Not on file    Other Topics   Concern    .   Not on file    Social History Narrative        Single                  Prior to Admission medications     Medication   Sig   Start Date   End Date   Taking?   Authorizing Provider    apixaban (ELIQUIS) 5 MG TABS tablet   Take 1 tablet (5 mg total) 2 (two) times daily by mouth.   02/23/17       Yes   [name] Huxley, MD    aspirin EC 81 MG tablet   Take 81 mg by mouth every morning.           Yes   [provider]    B Complex-C-Folic Acid (NEPHRO-VITE PO)   Take 1 tablet by mouth at bedtime.            Yes   [provider]    benzonatate (TESSALON) 100 MG capsule   2 po tid for cough  Patient taking differently: Take 200 mg by mouth 2 (two) times daily as needed. 2 po tid for cough   07/14/17       Yes   Lily Kocher, PA-C     calcium acetate (PHOSLO) 667 MG capsule   Take 2,001 mg by mouth See admin instructions. 1334 mg 3 times a day with meals and 667 mg with snacks           Yes   [provider]    cinacalcet (SENSIPAR) 90 MG tablet   Take 90 mg by mouth daily.           Yes   [provider]    FOSRENOL 500 MG chewable tablet   Chew 2,000 mg by mouth See admin instructions. Take 2000 mg with meals and 1000 mg with snacks   05/28/13       Yes   [provider]    midodrine (PROAMATINE) 5 MG tablet   Take 1 tablet by mouth See admin instructions. Pt may take before dialysis treatment, Tues, Thurs and Sat   02/09/16       Yes   [provider]    nitroGLYCERIN (NITROSTAT) 0.4 MG SL tablet   Place 1 tablet (0.4 mg total) under the tongue every 5 (five) minutes as needed. For chest   05/30/13       Yes   Richardson Dopp T, PA-C                    Current Facility-Administered Medications    Medication   Dose   Route   Frequency   Provider   Last Rate   Last Dose    .   0.9 %  sodium chloride infusion    250 mL   Intravenous   PRN   Nita Sells, MD            .   acetaminophen (TYLENOL) tablet 650 mg    650 mg   Oral   Q4H PRN   Nita Sells, MD            .   ALPRAZolam Duanne Moron) tablet 0.25 mg  0.25 mg   Oral   BID PRN   Nita Sells, MD            .   benzonatate (TESSALON) capsule 200 mg    200 mg   Oral   TID PRN   Nita Sells, MD            .   calcium acetate (PHOSLO) capsule 1,334 mg    1,334 mg   Oral   TID WC   Nita Sells, MD       1,334 mg at 08/01/17 1152    .   calcium acetate (PHOSLO) capsule 667 mg    667 mg   Oral   PRN   Nita Sells, MD            .   Chlorhexidine Gluconate Cloth 2 % PADS 6 each    6 each   Topical   Q0600   Nita Sells, MD            .   cinacalcet (SENSIPAR) tablet 90 mg    90 mg   Oral   Q breakfast   Nita Sells, MD       90 mg at 08/01/17 0842    .   ivabradine (CORLANOR) tablet 5 mg    5 mg   Oral   BID WC   Nita Sells, MD       5 mg at 08/01/17 0843    .   lanthanum (FOSRENOL) chewable tablet 1,000 mg    1,000 mg   Oral   BID BM & HS PRN   Nita Sells, MD       1,000 mg at 07/29/17 1708    .   lanthanum (FOSRENOL) chewable tablet 2,000 mg    2,000 mg   Oral   TID WC   Nita Sells, MD       2,000 mg at 08/01/17 1152    .   metoprolol succinate (TOPROL-XL) 24 hr tablet 12.5 mg    12.5 mg   Oral   Daily   [name] Dresser, MD       12.5 mg at 08/01/17 0843    .   midodrine (PROAMATINE) tablet 5 mg    5 mg   Oral   Q T,Th,Sa-HD   Nita Sells, MD       5 mg at 08/01/17 1152    .   multivitamin (RENA-VIT) tablet 1 tablet    1 tablet   Oral   QHS   Nita Sells, MD       1 tablet at 07/31/17 2253    .   mupirocin ointment (BACTROBAN) 2 % 1 application    1 application   Nasal   BID   Nita Sells, MD       1 application at 24/40/10 0843    .   ondansetron (ZOFRAN) injection 4 mg    4 mg   Intravenous   Q6H PRN   Samtani, Jai-Gurmukh, MD            .   sodium chloride flush (NS) 0.9 % injection 3 mL    3 mL   Intravenous   Q12H   Nita Sells, MD       3 mL at 08/01/17 1000    .   sodium chloride flush (NS) 0.9 % injection 3 mL    3 mL   Intravenous   PRN   Samtani,  Jai-Gurmukh, MD       3 mL at 08/01/17 0041    .   zolpidem (AMBIEN) tablet 5 mg    5 mg   Oral   QHS PRN,MR X 1   Samtani, Jai-Gurmukh, MD                  No Known Allergies           Review of Systems:                 General:                      normal appetite,  decreased energy, no weight gain, no weight loss, no fever              Cardiac:                       no chest pain with exertion, no chest pain at rest, +SOB with exertion, no resting SOB, + PND, + orthopnea, no palpitations, no arrhythmia, no atrial fibrillation, no LE edema, no dizzy spells, no syncope              Respiratory:                 no shortness of breath, no home oxygen, no productive cough, + persistent dry cough, no bronchitis, no wheezing, no hemoptysis, no asthma, no pain with inspiration or cough, possible sleep apnea, no CPAP at night              GI:                               no difficulty swallowing, no reflux, no frequent heartburn, no hiatal hernia, no abdominal pain, no constipation, no diarrhea, no hematochezia, no hematemesis, no melena              GU:                              Minimal UOP, no dysuria,  no frequency, no urinary tract infection, no hematuria, no enlarged prostate, no kidney stones, + kidney disease              Vascular:                     no pain suggestive of claudication, no pain in feet, no leg cramps, + varicose veins, no DVT, no non-healing foot ulcer              Neuro:                         no stroke, no TIA's, no seizures, no headaches, no temporary blindness one eye,  no slurred speech, no peripheral neuropathy, no chronic pain, mild instability of gait, no memory/cognitive dysfunction              Musculoskeletal:         no arthritis , no joint swelling, no myalgias, no difficulty walking, normal mobility               Skin:                            no rash, no itching,  no skin infections, no pressure sores or ulcerations              Psych:                         no anxiety, no depression, no nervousness, no unusual recent stress              Eyes:                           no blurry vision, no floaters, no recent vision changes, does not wear glasses or contacts              ENT:                            no hearing loss,  no loose or painful teeth, no dentures, last saw dentist > 20 years ago              Hematologic:               no easy bruising, no abnormal bleeding, no clotting disorder, no frequent epistaxis              Endocrine:                   no diabetes, does not check CBG's at home                               Physical Exam:                 BP (!) 90/59 (BP Location: Right Arm)   Pulse 96   Temp 98 F (36.7 C) (Oral)   Resp 19   Ht 6' (1.829 m)   Wt (!) 323 lb 4.8 oz (146.6 kg)   SpO2 92%   BMI 43.85 kg/m               General:                      Morbidly obese male NAD                HEENT:                       Unremarkable               Neck:                           no JVD, no bruits, no adenopathy               Chest:                          clear to auscultation, symmetrical breath sounds, no wheezes, no rhonchi               CV:                              RRR, grade III/VI diastolic murmur best RSB              Abdomen:  soft, non-tender, no masses               Extremities:                 warm, well-perfused, pulses not palpable, no lower extremity edema              Rectal/GU                   Deferred              Neuro:                         Grossly non-focal and symmetrical throughout              Skin:                            Clean and dry, no rashes, no breakdown     Diagnostic Tests:     Lab Results:  Recent Labs (last 2 labs)                                                       BMET:   Recent Labs (last 2 labs)                                                                                                                 CBG (last 3)    Recent Labs (last 2 labs)        PT/INR:    Recent Labs (last 2 labs)                                            CXR:  CHEST - 2  VIEW     COMPARISON:  Chest x-ray dated July 14, 2017.     FINDINGS:  The heart size and mediastinal contours are within normal limits.  Prominent central pulmonary arteries, similar to prior study. No  focal consolidation, pleural effusion, or pneumothorax. No acute  osseous abnormality.     IMPRESSION:  No active cardiopulmonary disease.        Electronically Signed    By: Titus [name] M.D.    On: 07/28/2017 18:46              Transthoracic Echocardiography     Patient:    Add, Dinapoli  MR #:       [id number]  Study Date: 07/28/2017  Gender:     M  Age:        30  Height:     182.9 cm  Weight:     147.9 kg  BSA:  2.81 m^2  Pt. Status:  Room:      SONOGRAPHER  [facility], [facility], [place], MD   [place], MD   [place], MD   PERFORMING   [facility], [place]     cc:     -------------------------------------------------------------------  LV EF: 20% -   25%     -------------------------------------------------------------------  History:   PMH:   Tachycardia and syncope.  Congestive heart  failure.  Risk factors:  Former tobacco use.     -------------------------------------------------------------------  Study Conclusions     - Procedure narrative: Transthoracic echocardiography. Image    quality was adequate. The study was technically difficult, as a    result of poor acoustic windows, poor sound wave transmission,    and body habitus.  - Left ventricle: Wall thickness was increased in a pattern of mild    LVH. Systolic function was severely reduced. The estimated    ejection fraction was in the range of 20% to 25%. The study is    not technically sufficient to allow evaluation of LV diastolic    function.  - Aortic valve: Moderately calcified annulus. Trileaflet; mildly    calcified leaflets. There was mild to moderate  regurgitation -    not well defined. Large, globular and calcified density noted in    LVOT just proximal to the aortic valve. Parasternal views suggest    potential attachment to the LVOT/annulus, however short axis    views are more consistent with attachment to the left coronary    cusp. Due to large size this appears to obstruct flow through the    LVOT which could be impacting cardiac output and therefore    resulting tachycardia. High suspicion for valvular vegetation,    although could be more chronic finding due to calcification. TEE    is indicated to evaluate further.  - Mitral valve: Moderately to severely calcified annulus. There was    mild regurgitation. Valve area by continuity equation (using LVOT    flow): 2.45 cm^2.  - Left atrium: The atrium was moderately dilated.  - Right ventricle: Poorly visualized. Systolic function was    reduced.  - Tricuspid valve: Mildly thickened leaflets. There was mild    regurgitation. Peak RV-RA gradient (S): 23 mm Hg.  - Pericardium, extracardiac: There was no pericardial effusion.     Impressions:     - Large, globular and calcified density noted in LVOT just proximal    to the aortic valve. Parasternal views suggest potential    attachment to the LVOT/annulus, however short axis views are more    consistent with attachment to the left coronary cusp. Due to    large size this appears to obstruct flow through the LVOT which    could be impacting cardiac output and therefore resulting    tachycardia. High suspicion for valvular vegetation, although    could be more chronic finding due to calcification. TEE is    indicated to evaluate further. Ordering provider out of the    office. Patient being contacted by nursing and directed to ER at    [facility] in anticipation of admission for further evaluation of    both cardiomyopathy and potential endocarditis.      -------------------------------------------------------------------  Study data:  Comparison was made to the study of July 2012. LVEF  60%, no RWMA.  Study status:  Routine.  Procedure:  Transthoracic  echocardiography. Image quality was adequate. The study was  technically difficult, as a result of poor acoustic windows, poor  sound wave transmission, and body habitus.  Study completion:  There were no complications.          Transthoracic  echocardiography.  M-mode, complete 2D, spectral Doppler, and color  Doppler.  Birthdate:  Patient birthdate: [birth year].  Age:  Patient  is 52 yr old.  Sex:  Gender: male.    BMI: 44.2 kg/m^2.  Blood  pressure:     133/84  Patient status:  Outpatient.  Study date:  Study date: 07/28/2017. Study time: 05:07 PM.     -------------------------------------------------------------------     -------------------------------------------------------------------  Left ventricle:   Wall thickness was increased in a pattern of mild  LVH.   Systolic function was severely reduced. The estimated  ejection fraction was in the range of 20% to 25%. Images were  inadequate for LV wall motion assessment. The study is not  technically sufficient to allow evaluation of LV diastolic  function.     -------------------------------------------------------------------  Aortic valve:   Moderately calcified annulus. Trileaflet; mildly  calcified leaflets.  Doppler:  There was mild to moderate  regurgitation - not well defined. Large, globular and calcified  density noted in LVOT just proximal to the aortic valve.  Parasternal views suggest potential attachment to the LVOT/annulus,  however short axis views are more consistent with attachment to the  left coronary cusp. Due to large size this appears to obstruct flow  through the LVOT which could be impacting cardiac output and  therefore resulting tachycardia. High suspicion for  valvular  vegetation, although could be more chronic finding due to  calcification. TEE is indicated to evaluate further.     -------------------------------------------------------------------  Aorta:  Aortic root: The aortic root was normal in size.     -------------------------------------------------------------------  Mitral valve:   Moderately to severely calcified annulus.  Doppler:   There was mild regurgitation.    Valve area by continuity equation  (using LVOT flow): 2.45 cm^2. Indexed valve area by continuity  equation (using LVOT flow): 0.87 cm^2/m^2.    Mean gradient (D): 4  mm Hg. Peak gradient (D): 10 mm Hg.     -------------------------------------------------------------------  Left atrium:  The atrium was moderately dilated.     -------------------------------------------------------------------  Right ventricle:  Poorly visualized. The cavity size was normal.  Systolic function was reduced.     -------------------------------------------------------------------  Pulmonic valve:    The valve appears to be grossly normal.  Doppler:  There was trivial regurgitation.     -------------------------------------------------------------------  Tricuspid valve:   Mildly thickened leaflets.  Doppler:  There was  mild regurgitation.     -------------------------------------------------------------------  Right atrium:  The atrium was normal in size.     -------------------------------------------------------------------  Pericardium:  There was no pericardial effusion.     -------------------------------------------------------------------  Systemic veins:  Inferior vena cava: Poorly visualized.     -------------------------------------------------------------------  Measurements      Left ventricle                            Value          Reference   LV ID, ED, PLAX chordal            (H)    64.9  mm       43 - 52   LV  ID, ES, PLAX chordal            (  H)    48.5  mm       23 - 38   LV fx shortening, PLAX chordal     (L)    25    %        >=29   LV PW thickness, ED                       10.7  mm       ---------   IVS/LV PW ratio, ED                       1              <=1.3   Stroke volume, 2D                         70    ml       ---------   Stroke volume/bsa, 2D                     25    ml/m^2   ---------   Longitudinal strain, TDI                  6     %        ---------      Ventricular septum                        Value          Reference   IVS thickness, ED                         10.7  mm       ---------      LVOT                                      Value          Reference   LVOT ID, S                                21    mm       ---------   LVOT area                                 3.46  cm^2     ---------   LVOT peak velocity, S                     103   cm/s     ---------   LVOT mean velocity, S                     81.3  cm/s     ---------   LVOT VTI, S                               20.3  cm       ---------      Aortic valve  Value          Reference   Aortic regurg pressure half-time          277   ms       ---------      Aorta                                     Value          Reference   Aortic root ID, ED                        36    mm       ---------      Left atrium                               Value          Reference   LA ID, A-P, ES                            47    mm       ---------   LA ID/bsa, A-P                            1.67  cm/m^2   <=2.2   LA volume, S                              151   ml       ---------   LA volume/bsa, S                          53.7  ml/m^2   ---------   LA volume, ES, 1-p A4C                    124   ml       ---------   LA volume/bsa, ES, 1-p A4C                44.1  ml/m^2   ---------   LA volume, ES, 1-p A2C                    157   ml       ---------   LA volume/bsa, ES, 1-p  A2C                55.9  ml/m^2   ---------      Mitral valve                              Value          Reference   Mitral E-wave peak velocity               159   cm/s     ---------   Mitral A-wave peak velocity               57    cm/s     ---------   Mitral mean velocity, D  89    cm/s     ---------   Mitral deceleration time           (L)    87    ms       150 - 230   Mitral mean gradient, D                   4     mm Hg    ---------   Mitral peak gradient, D                   10    mm Hg    ---------   Mitral E/A ratio, peak                    2.8            ---------   Mitral valve area, LVOT continuity        2.45  cm^2     ---------   Mitral valve area/bsa, LVOT               0.87  cm^2/m^2 ---------   continuity   Mitral annulus VTI, D                     28.7  cm       ---------   Mitral regurg VTI, PISA                   123   cm       ---------   Mitral ERO, PISA                          0.24  cm^2     ---------   Mitral regurg volume, PISA                30    ml       ---------      Tricuspid valve                           Value          Reference   Tricuspid peak RV-RA gradient             23    mm Hg    ---------      Right ventricle                           Value          Reference   TAPSE                                     27.2  mm       ---------     Legend:  (L)  and  (H)  mark values outside specified reference range.     -------------------------------------------------------------------  Prepared and Electronically Authenticated by     Rozann Lesches, M.D.  2019-04-12T16:35:28        Transesophageal Echocardiography     Patient:    Sopheap, Boehle  MR #:       [id number]  Study Date: 07/31/2017  Gender:     M  Age:        89  Height:  182.9 cm  Weight:     148.5 kg  BSA:        2.82 m^2  Pt. Status:  Room:      PERFORMING   Jenkins Rouge, M.D.   [place],  [place] [id number]   PJASNKNL     [facility], [place] [address], [place] [id number]   ADMITTING    Vianne Bulls   SONOGRAPHER  Cardell Peach, RDCS     cc:     -------------------------------------------------------------------  LV EF: 25% -   30%     -------------------------------------------------------------------  History:   PMH:  Aortic Valve mass     Syncope.  Congestive heart failure.  Risk factors:  Hypertension.     -------------------------------------------------------------------  Study Conclusions     - Left ventricle: Hypertrophy was noted. Systolic function was    severely reduced. The estimated ejection fraction was in the    range of 25% to 30%. Diffuse hypokinesis.  - Aortic valve: Valve is trileaflet and severely thickened. Did not    think there was a mass or vegetation on valve Nodular    calcification of leaflets    wtith bulky calcium. There was significatn shadowing of the mid    esohageal views obscuring the LVOT but had good images    from trans gastic imaging There was mild stenosis. There was    severe regurgitation.  - Mitral valve: Moderately to severely calcified annulus.    Moderately thickened, moderately calcified leaflets . There was    moderate regurgitation.  - Left atrium: The atrium was dilated. No evidence of thrombus in    the atrial cavity or appendage.  - Right ventricle: The cavity size was mildly dilated.  - Right atrium: No evidence of thrombus in the atrial cavity or    appendage.  - Atrial septum: No defect or patent foramen ovale was identified.  - Impressions: 3D rendering of the Aortic and mitral valves was    performed. Note patient was intubated for procedure by Dr Glennon Mac    Anesthesia due    to low BP, coughing and need for propopofol for sedation.     Impressions:     - 3D rendering of the Aortic and mitral valves was performed. Note    patient was intubated for  procedure by Dr Glennon Mac Anesthesia due    to low BP, coughing and need for propopofol for sedation.     -------------------------------------------------------------------  Study data:   Study status:  Routine.  Consent:  The risks,  benefits, and alternatives to the procedure were explained to the  patient and informed consent was obtained.  Procedure:  Initial  setup. The patient was brought to the laboratory. Surface ECG leads  were monitored. Sedation. Conscious sedation was administered by  anesthesiology staff. Transesophageal echocardiography. Topical  anesthesia was obtained using viscous lidocaine. A transesophageal  probe was inserted by the attending cardiologist. Image quality was  adequate.  Study completion:  The patient tolerated the procedure  well. There were no complications.          Diagnostic  transesophageal echocardiography.  2D and color Doppler.  Birthdate:  Patient birthdate: April 16, 1966.  Age:  Patient is 52 yr  old.  Sex:  Gender: male.    BMI: 44.4 kg/m^2.  Blood pressure:  109/68  Patient status:  Inpatient.  Study date:  Study date:  07/31/2017. Study time: 12:13 PM.  Location:  Endoscopy.     -------------------------------------------------------------------     -------------------------------------------------------------------  Left ventricle:  Hypertrophy was noted. Systolic function was  severely reduced. The estimated ejection fraction was in the range  of 25% to 30%. Diffuse hypokinesis.     -------------------------------------------------------------------  Aortic valve:  Valve is trileaflet and severely thickened. Did not  think there was a mass or vegetation on valve Nodular calcification  of leaflets  wtith bulky calcium. There was significatn shadowing of the mid  esohageal views obscuring the LVOT but had good images  from trans gastic imaging  Trileaflet; severely thickened, severely  calcified leaflets.   Doppler:   There was mild stenosis.   There  was severe regurgitation.    VTI ratio of LVOT to aortic valve:  0.37. Peak velocity ratio of LVOT to aortic valve: 0.4. Mean  velocity ratio of LVOT to aortic valve: 0.4.    Mean gradient (S):  3 mm Hg. Peak gradient (S): 5 mm Hg.     -------------------------------------------------------------------  Mitral valve:   Moderately to severely calcified annulus.  Moderately thickened, moderately calcified leaflets .  Doppler:  There was moderate regurgitation.     -------------------------------------------------------------------  Left atrium:  The atrium was dilated.  No evidence of thrombus in  the atrial cavity or appendage.     -------------------------------------------------------------------  Atrial septum:  No defect or patent foramen ovale was identified.     -------------------------------------------------------------------  Right ventricle:  The cavity size was mildly dilated.     -------------------------------------------------------------------  Pulmonic valve:    Doppler:  There was mild regurgitation.     -------------------------------------------------------------------  Tricuspid valve:   Doppler:  There was mild regurgitation.     -------------------------------------------------------------------  Right atrium:  The atrium was normal in size.  No evidence of  thrombus in the atrial cavity or appendage.     -------------------------------------------------------------------  Pericardium:  The pericardium was normal in appearance. There was  no pericardial effusion.     -------------------------------------------------------------------  Measurements      LVOT                                 Value   LVOT peak velocity, S                47    cm/s   LVOT mean velocity, S                32.9  cm/s   LVOT VTI, S                          7.97  cm   LVOT peak gradient, S                 1     mm Hg      Aortic valve                         Value   Aortic valve peak velocity, S        117   cm/s   Aortic valve mean velocity, S        81.9  cm/s   Aortic valve VTI, S                  21.8  cm   Aortic mean gradient, S              3     mm Hg  Aortic peak gradient, S              5     mm Hg   VTI ratio, LVOT/AV                   0.37   Velocity ratio, peak, LVOT/AV        0.4   Velocity ratio, mean, LVOT/AV        0.4   Aortic regurg pressure half-time     255   ms     Legend:  (L)  and  (H)  mark values outside specified reference range.     -------------------------------------------------------------------  Prepared and Electronically Authenticated by     Jenkins Rouge, M.D.  2019-04-15T13:30:36           Impression:     Patient has stage D severe symptomatic primary aortic insufficiency and moderate mitral regurgitation with severe left ventricular systolic dysfunction and long-standing history of hypertension with diastolic dysfunction complicated by dialysis dependent end-stage renal disease.  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  I have personally reviewed the patient's recent transthoracic and transesophageal echocardiograms.  The aortic valve is trileaflet.  There is severe aortic insufficiency with an eccentric jet suggestive of possible leaflet prolapse and/or perforation.  There is very mild thickening of the leaflets but nothing that appears pathognomonic for underlying rheumatic disease.  There is no clear evidence of vegetation or leaflet perforation, but it is possible the patient's valvular disease could be related to previous history of bacterial endocarditis.  The patient has moderate mitral regurgitation with what appears to be primarily normal leaflet  mobility.  This may be purely secondary MR although there is some sclerosis of the posterior leaflet, posterior mitral annular calcification, and the possibility of significant leaflet restriction.  I agree the patient will need aortic valve repair or replacement.  It is possible that he might need concomitant mitral valve repair as well.  Diagnostic cardiac catheterization needs to be performed and has been scheduled tomorrow to rule out the presence of significant coronary artery disease and evaluate right-sided pressures and cardiac output.  At this point there does not appear to be any need for urgent surgical intervention and there are no convincing findings to suggest the presence has ongoing bacterial endocarditis.  The patient has not seen a dentist in many years and will need dental examination and cleaning prior to surgery.           Plan:     The patient and his wife counseled at length regarding treatment alternatives for management of severe aortic insufficiency including continued medical therapy versus proceeding with aortic valve repair or replacement in the near future.  The natural history of aortic insufficiency was reviewed, as was long term prognosis with medical therapy alone.  The possible need for mitral valve repair/replacement was discussed.  Patient is interested in proceeding with definitive surgical management at some point in the near future.  We will wait results for left and right heart catheterization planned for tomorrow.  Dental consultation will be requested.  We will continue to follow while the patient remains in the hospital and make plans for follow-up in surgery as soon as practical.  All questions answered.        I spent in excess of 120 minutes during the conduct of this hospital consultation  and >50% of this time involved direct face-to-face encounter for counseling and/or coordination of the patient's care.        Valentina Gu. Roxy Manns,  MD  08/01/2017  1:25 PM             Electronically signed by [name] Alberts, MD at 08/01/2017  3:00 PM Electronically signed by [name] Alberts, MD at 08/02/2017  5:52 AM

## 2017-08-07 NOTE — Anesthesia Procedure Notes (Signed)
Procedure Name: Intubation Date/Time: 08/07/2017 9:49 AM Performed by: Cleda Daub, CRNA Pre-anesthesia Checklist: Patient identified, Emergency Drugs available, Suction available and Patient being monitored Patient Re-evaluated:Patient Re-evaluated prior to induction Oxygen Delivery Method: Circle system utilized Preoxygenation: Pre-oxygenation with 100% oxygen Induction Type: IV induction Ventilation: Mask ventilation without difficulty and Mask ventilation throughout procedure Laryngoscope Size: Mac and 3 Grade View: Grade I Tube type: Oral Nasal Tubes: Right, Nasal prep performed, Nasal Rae and Magill forceps- large, utilized Number of attempts: 1 Placement Confirmation: ETT inserted through vocal cords under direct vision,  positive ETCO2 and breath sounds checked- equal and bilateral Tube secured with: Tape Dental Injury: Teeth and Oropharynx as per pre-operative assessment

## 2017-08-07 NOTE — Discharge Instructions (Signed)
MOUTH CARE AFTER SURGERY ° °FACTS: °· Ice used in ice bag helps keep the swelling down, and can help lessen the pain. °· It is easier to treat pain BEFORE it happens. °· Spitting disturbs the clot and may cause bleeding to start again, or to get worse. °· Smoking delays healing and can cause complications. °· Sharing prescriptions can be dangerous.  Do not take medications not recently prescribed for you. °· Antibiotics may stop birth control pills from working.  Use other means of birth control while on antibiotics. °· Warm salt water rinses after the first 24 hours will help lessen the swelling:  Use 1/2 teaspoonful of table salt per oz.of water. ° °DO NOT: °· Do not spit.  Do not drink through a straw. °· Strongly advised not to smoke, dip snuff or chew tobacco at least for 3 days. °· Do not eat sharp or crunchy foods.  Avoid the area of surgery when chewing. °· Do not stop your antibiotics before your instructions say to do so. °· Do not eat hot foods until bleeding has stopped.  If you need to, let your food cool down to room temperature. ° °EXPECT: °· Some swelling, especially first 2-3 days. °· Soreness or discomfort in varying degrees.  Follow your dentist's instructions about how to handle pain before it starts. °· Pinkish saliva or light blood in saliva, or on your pillow in the morning.  This can last around 24 hours. °· Bruising inside or outside the mouth.  This may not show up until 2-3 days after surgery.  Don't worry, it will go away in time. °· Pieces of "bone" may work themselves loose.  It's OK.  If they bother you, let us know. ° °WHAT TO DO IMMEDIATELY AFTER SURGERY: °· Bite on the gauze with steady pressure for 1-2 hours.  Don't chew on the gauze. °· Do not lie down flat.  Raise your head support especially for the first 24 hours. °· Apply ice to your face on the side of the surgery.  You may apply it 20 minutes on and a few minutes off.  Ice for 8-12 hours.  You may use ice up to 24  hours. °· Before the numbness wears off, take a pain pill as instructed. °· Prescription pain medication is not always required. ° °SWELLING: °· Expect swelling for the first couple of days.  It should get better after that. °· If swelling increases 3 days or so after surgery; let us know as soon as possible. ° °FEVER: °· Take Tylenol every 4 hours if needed to lower your temperature, especially if it is at 100F or higher. °· Drink lots of fluids. °· If the fever does not go away, let us know. ° °BREATHING TROUBLE: °· Any unusual difficulty breathing means you have to have someone bring you to the emergency room ASAP ° °BLEEDING: °· Light oozing is expected for 24 hours or so. °· Prop head up with pillows °· Avoid spitting °· Do not confuse bright red fresh flowing blood with lots of saliva colored with a little bit of blood. °· If you notice some bleeding, place gauze or a tea bag where it is bleeding and apply CONSTANT pressure by biting down for 1 hour.  Avoid talking during this time.  Do not remove the gauze or tea bag during this hour to "check" the bleeding. °· If you notice bright RED bleeding FLOWING out of particular area, and filling the floor of your mouth, put   a wad of gauze on that area, bite down firmly and constantly.  Call us immediately.  If we're closed, have someone bring you to the emergency room.  ORAL HYGIENE:  Brush your teeth as usual after meals and before bedtime.  Use a soft toothbrush around the area of surgery.  DO NOT AVOID BRUSHING.  Otherwise bacteria(germs) will grow and may delay healing or encourage infection.  Since you cannot spit, just gently rinse and let the water flow out of your mouth.  DO NOT SWISH HARD.  EATING:  Cool liquids are a good point to start.  Increase to soft foods as tolerated.  PRESCRIPTIONS:  Follow the directions for your prescriptions exactly as written.  If Dr. Enrique Sack gave you a narcotic pain medication, do not drive, operate  machinery or drink alcohol when on that medication.  QUESTIONS:  Call our office during office hours [phone] or call the Emergency Room at [phone].   Post Anesthesia Home Care Instructions  Activity: Get plenty of rest for the remainder of the day. A responsible individual must stay with you for 24 hours following the procedure.  For the next 24 hours, DO NOT: -Drive a car -Paediatric nurse -Drink alcoholic beverages -Take any medication unless instructed by your physician -Make any legal decisions or sign important papers.  Meals: Start with liquid foods such as gelatin or soup. Progress to regular foods as tolerated. Avoid greasy, spicy, heavy foods. If nausea and/or vomiting occur, drink only clear liquids until the nausea and/or vomiting subsides. Call your physician if vomiting continues.  Special Instructions/Symptoms: Your throat may feel dry or sore from the anesthesia or the breathing tube placed in your throat during surgery. If this causes discomfort, gargle with warm salt water. The discomfort should disappear within 24 hours.  If you had a scopolamine patch placed behind your ear for the management of post- operative nausea and/or vomiting:  1. The medication in the patch is effective for 72 hours, after which it should be removed.  Wrap patch in a tissue and discard in the trash. Wash hands thoroughly with soap and water. 2. You may remove the patch earlier than 72 hours if you experience unpleasant side effects which may include dry mouth, dizziness or visual disturbances. 3. Avoid touching the patch. Wash your hands with soap and water after contact with the patch.

## 2017-08-07 NOTE — Op Note (Addendum)
OPERATIVE REPORT  Patient:            [name] [name] Date of Birth:  10-Feb-1966 MRN:                [id number]   DATE OF PROCEDURE:  08/07/2017  PREOPERATIVE DIAGNOSES: 1.  Aortic insufficiency 2.  Pre-heart valve surgery dental protocol 3.  Retained root segments 4.  Chronic periodontitis 5.  Dental caries 6.  Loose teeth  POSTOPERATIVE DIAGNOSES: 1.  Aortic insufficiency 2.  Pre-heart valve surgery dental protocol 3.  Retained root segments 4.  Chronic periodontitis 5.  Dental caries 6.  Loose teeth   OPERATIONS: 1. Multiple extraction of tooth numbers 2, 7, 8, 13, 14, 15, 18, and 31 2. 4 Quadrants of alveoloplasty 3. Gross debridement of remaining dentition   SURGEON: [name], DDS  ASSISTANT: Molli Posey (dental assistant)  ANESTHESIA: General anesthesia via nasoendotracheal tube.  MEDICATIONS: 1. Ancef 3 g IV prior to invasive dental procedures. 2. Local anesthesia with a total utilization of 4 carpules each containing 34 mg of lidocaine with 0.017 mg of epinephrine as well as 2 carpules each containing 9 mg of bupivacaine with 0.009 mg of epinephrine.  SPECIMENS: There are 8 teeth that were discarded.  DRAINS: None  CULTURES: None  COMPLICATIONS: None  ESTIMATED BLOOD LOSS: 100 mLs.  INTRAVENOUS FLUIDS: 400 mLs of normal saline  INDICATIONS: The patient was recently diagnosed with severe aortic insufficiency.  A medically necessary dental consultation was then requested to evaluate poor dentition.  The patient was examined and treatment planned for extraction of indicated teeth with alveoloplasty and gross debridement remaining dentition in the operating room and general anesthesia.  This treatment plan was formulated to decrease the risks and complications associated with dental infection from affecting the patient's systemic health and the anticipated heart valve surgery  OPERATIVE FINDINGS: Patient was examined operating room number  12.  The teeth were identified for extraction.  Tooth numbers 2, 7, 8, 13, 14 15, 18, and 31 were identified for extraction after a thorough examination in the operating room.  The patient was noted be affected by chronic periodontitis, loose teeth, dental caries, and presence of retained root segments.   DESCRIPTION OF PROCEDURE: Patient was brought to the main operating room number 12. Patient was then placed in the supine position on the operating table. General anesthesia was then induced per the anesthesia team. The patient was then prepped and draped in the usual manner for dental medicine procedure. A timeout was performed. The patient was identified and procedures were verified. A throat pack was placed at this time. The oral cavity was then thoroughly examined with the findings noted above. The patient was then ready for dental medicine procedure as follows:  Local anesthesia was then administered sequentially with a total utilization of 4 carpules each containing 34 mg of lidocaine with 0.017 mg of epinephrine as well as 2 carpules  each containing 9 mg bupivacaine with 0.009 mg of epinephrine.  The Maxillary left and right quadrants first approached. Anesthesia was then delivered utilizing infiltration with lidocaine with epinephrine.  Retained root segment area #2 was then removed with the rongeurs.  The socket was curetted and compressed appropriately.  Surgical site was irrigated with copious amounts sterile saline.  The surgical site was then closed from the distal of #2 and extended the mesial #2 utilizing 3-0 chromic gut suture in a continuous interrupted suture technique x1.    The teeth in the  area of tooth numbers 7 and 8 were then further evaluated.  Tooth #7 was a retained root segment and there was extensive dental caries on the mesial of #8 along with tooth mobility requiring this tooth be extracted at this time.  A 15 blade incision was then made from the mesial of #9 extended to  the mesial of #6.  Surgical flap was then carefully reflected.  Tooth numbers 7 and 8 were then removed with a 150 forceps without complications.  Alveoloplasty was then performed utilizing a ronguers and bone file to help achieve primary closure.  The surgical site was then irrigated with copious amounts of sterile saline.  A piece of Surgifoam was placed the extraction sockets of tooth numbers 7 and 8 appropriately.  The surgical site was then closed from the mesial #9 extended the mesial #6 utilizing 3-0 chromic gut suture in a continuous interrupted suture technique x1.  The maxillary left surgical site was then approached.  A 15 blade incision was made from the maxillary left tuberosity and extended to the distal of #12.  Surgical flap was then carefully reflected.  Retained root segments in the area of tooth numbers 13, 14, 15 were then removed with a 150 forceps without complications.  Alveoloplasty was then performed utilizing a rongeurs and bone file to help achieve primary closure.  The tissues were approximated and trimmed appropriately.  The surgical site was then irrigated with copious amounts sterile saline.  The surgical site was then closed from the maxillary left tuberosity and extended to the distal #12 utilizing 3-0 chromic gut suture in a continuous interrupted suture technique x1.  At this point time, the mandibular quadrants were approached. The patient was given bilateral inferior alveolar nerve blocks and long buccal nerve blocks utilizing the bupivacaine with epinephrine. Further infiltration was then achieved utilizing the lidocaine with epinephrine.  During the initial examination, tooth #18 and 31 were noted to have significant tooth mobility requiring extraction at this time.  A 15 blade incision was then made from the distal of number 17 extended to the distal of #20.  A surgical flap was then carefully reflected. Tooth number 18 was then removed with a 23 forceps without  complications.  Alveoloplasty was then performed utilizing a ronguers and bone file to help achieve primary closure in the area of #17 through 20.  The tissues were approximated and trimmed appropriately.  The surgical site was irrigated with copious amounts sterile saline.  A piece of Surgifoam was placed in the extraction sockets #18.  The surgical site was then closed from distal #17 extended to the distal #20 utilizing 3-0 chromic gut suture in a continuous interrupted suture technique x1.  The mandibular right surgical site was then approached.  A 15 blade incision was made from the distal #32 and extended to the distal of #29 .  The flap was then reflected appropriately.  Tooth #31 was then subluxated and removed with a 23 forceps without complications.  Alveoloplasty was then performed utilizing a rongeurs and bone file in the area of #32 to the mesial of #30 to help achieve primary closure. The tissues were approximated and trimmed appropriately. The surgical sites were then irrigated with copious amounts of sterile saline.  A piece of Surgifoam was placed the extraction socket #31.  The mandibular right surgical site was then closed with a distal #32 and extended distal #29 utilizing 3-0 chromic gut suture in a continuous interrupted suture technique x1.    At  this point time the gross debridement procedure was performed.  A sonic scaler was used to remove accretions.  A series of hand curettes were used to further remove accretions.  The Sonic scaler was then again used to further remove accretions.  This completed the gross debridement procedure.   At this point time, the entire mouth was irrigated with copious amounts of sterile saline. The patient was examined for complications, seeing none, the dental medicine procedure was deemed to be complete. The throat pack was removed at this time. An oral airway was then placed at the request of the anesthesia team. A series of 4 x 4 gauze moistened with  Amicar 5% rinse were placed in the mouth to aid hemostasis. The patient was then handed over to the anesthesia team for final disposition. After an appropriate amount of time, the patient was extubated and taken to the postanesthsia care unit in good condition. All counts were correct for the dental medicine procedure.  Patient is to continue the Amicar rinses postoperatively.  Patient is to rinse with 10 mL's every hour for the next 10 hours and a swish and spit manner.  The patient is to discontinue the Eliquis therapy as per instructions from Dr. Roxy Manns and Dr. Aundra Dubin.  Patient is to use the Percocet 5/325 for pain medication as prescribed.   [name], DDS.

## 2017-08-08 ENCOUNTER — Encounter (HOSPITAL_COMMUNITY): Payer: Self-pay | Admitting: Dentistry

## 2017-08-08 DIAGNOSIS — N2581 Secondary hyperparathyroidism of renal origin: Secondary | ICD-10-CM | POA: Diagnosis not present

## 2017-08-08 DIAGNOSIS — N186 End stage renal disease: Secondary | ICD-10-CM | POA: Diagnosis not present

## 2017-08-08 DIAGNOSIS — E611 Iron deficiency: Secondary | ICD-10-CM | POA: Diagnosis not present

## 2017-08-08 DIAGNOSIS — Z992 Dependence on renal dialysis: Secondary | ICD-10-CM | POA: Diagnosis not present

## 2017-08-08 DIAGNOSIS — D509 Iron deficiency anemia, unspecified: Secondary | ICD-10-CM | POA: Diagnosis not present

## 2017-08-09 ENCOUNTER — Other Ambulatory Visit (HOSPITAL_COMMUNITY): Payer: Self-pay | Admitting: *Deleted

## 2017-08-09 MED ORDER — IVABRADINE HCL 5 MG PO TABS: 5.0000 mg | ORAL_TABLET | Freq: Two times a day (BID) | ORAL | 3 refills | Status: DC

## 2017-08-10 ENCOUNTER — Telehealth (HOSPITAL_COMMUNITY): Payer: Self-pay | Admitting: *Deleted

## 2017-08-10 DIAGNOSIS — N2581 Secondary hyperparathyroidism of renal origin: Secondary | ICD-10-CM | POA: Diagnosis not present

## 2017-08-10 DIAGNOSIS — N186 End stage renal disease: Secondary | ICD-10-CM | POA: Diagnosis not present

## 2017-08-10 DIAGNOSIS — D509 Iron deficiency anemia, unspecified: Secondary | ICD-10-CM | POA: Diagnosis not present

## 2017-08-10 DIAGNOSIS — E611 Iron deficiency: Secondary | ICD-10-CM | POA: Diagnosis not present

## 2017-08-10 DIAGNOSIS — Z992 Dependence on renal dialysis: Secondary | ICD-10-CM | POA: Diagnosis not present

## 2017-08-10 NOTE — Telephone Encounter (Signed)
Corlanor PA approved 08/10/17 through 08/11/18.

## 2017-08-12 DIAGNOSIS — N2581 Secondary hyperparathyroidism of renal origin: Secondary | ICD-10-CM | POA: Diagnosis not present

## 2017-08-12 DIAGNOSIS — E611 Iron deficiency: Secondary | ICD-10-CM | POA: Diagnosis not present

## 2017-08-12 DIAGNOSIS — Z992 Dependence on renal dialysis: Secondary | ICD-10-CM | POA: Diagnosis not present

## 2017-08-12 DIAGNOSIS — D509 Iron deficiency anemia, unspecified: Secondary | ICD-10-CM | POA: Diagnosis not present

## 2017-08-12 DIAGNOSIS — N186 End stage renal disease: Secondary | ICD-10-CM | POA: Diagnosis not present

## 2017-08-14 ENCOUNTER — Encounter (HOSPITAL_COMMUNITY): Payer: Self-pay | Admitting: Dentistry

## 2017-08-14 ENCOUNTER — Other Ambulatory Visit: Payer: Self-pay

## 2017-08-14 ENCOUNTER — Ambulatory Visit (HOSPITAL_COMMUNITY): Payer: Self-pay | Admitting: Dentistry

## 2017-08-14 ENCOUNTER — Ambulatory Visit (HOSPITAL_COMMUNITY)
Admit: 2017-08-14 | Discharge: 2017-08-14 | Disposition: A | Discharge status: Home / Self Care | Payer: Medicare Other | Source: Ambulatory Visit | Attending: Cardiology | Admitting: Cardiology

## 2017-08-14 ENCOUNTER — Encounter (HOSPITAL_COMMUNITY): Payer: Self-pay | Admitting: Cardiology

## 2017-08-14 VITALS — BP 143/71 | HR 100 | Wt 314.8 lb

## 2017-08-14 VITALS — BP 111/66 | HR 101 | Temp 97.9°F

## 2017-08-14 DIAGNOSIS — Z8249 Family history of ischemic heart disease and other diseases of the circulatory system: Secondary | ICD-10-CM | POA: Diagnosis not present

## 2017-08-14 DIAGNOSIS — Z87891 Personal history of nicotine dependence: Secondary | ICD-10-CM | POA: Diagnosis not present

## 2017-08-14 DIAGNOSIS — I061 Rheumatic aortic insufficiency: Secondary | ICD-10-CM

## 2017-08-14 DIAGNOSIS — I5021 Acute systolic (congestive) heart failure: Secondary | ICD-10-CM | POA: Diagnosis not present

## 2017-08-14 DIAGNOSIS — I471 Supraventricular tachycardia: Secondary | ICD-10-CM | POA: Diagnosis not present

## 2017-08-14 DIAGNOSIS — K0530 Chronic periodontitis, unspecified: Secondary | ICD-10-CM

## 2017-08-14 DIAGNOSIS — Z7901 Long term (current) use of anticoagulants: Secondary | ICD-10-CM | POA: Insufficient documentation

## 2017-08-14 DIAGNOSIS — I132 Hypertensive heart and chronic kidney disease with heart failure and with stage 5 chronic kidney disease, or end stage renal disease: Secondary | ICD-10-CM | POA: Insufficient documentation

## 2017-08-14 DIAGNOSIS — I429 Cardiomyopathy, unspecified: Secondary | ICD-10-CM | POA: Insufficient documentation

## 2017-08-14 DIAGNOSIS — K08199 Complete loss of teeth due to other specified cause, unspecified class: Secondary | ICD-10-CM

## 2017-08-14 DIAGNOSIS — I351 Nonrheumatic aortic (valve) insufficiency: Secondary | ICD-10-CM

## 2017-08-14 DIAGNOSIS — Z992 Dependence on renal dialysis: Secondary | ICD-10-CM | POA: Diagnosis not present

## 2017-08-14 DIAGNOSIS — I2510 Atherosclerotic heart disease of native coronary artery without angina pectoris: Secondary | ICD-10-CM | POA: Insufficient documentation

## 2017-08-14 DIAGNOSIS — M264 Malocclusion, unspecified: Secondary | ICD-10-CM

## 2017-08-14 DIAGNOSIS — I5022 Chronic systolic (congestive) heart failure: Secondary | ICD-10-CM

## 2017-08-14 DIAGNOSIS — Z79899 Other long term (current) drug therapy: Secondary | ICD-10-CM | POA: Insufficient documentation

## 2017-08-14 DIAGNOSIS — Z01818 Encounter for other preprocedural examination: Secondary | ICD-10-CM

## 2017-08-14 DIAGNOSIS — N186 End stage renal disease: Secondary | ICD-10-CM

## 2017-08-14 DIAGNOSIS — I080 Rheumatic disorders of both mitral and aortic valves: Secondary | ICD-10-CM | POA: Diagnosis not present

## 2017-08-14 DIAGNOSIS — K08409 Partial loss of teeth, unspecified cause, unspecified class: Secondary | ICD-10-CM

## 2017-08-14 DIAGNOSIS — G4730 Sleep apnea, unspecified: Secondary | ICD-10-CM | POA: Diagnosis not present

## 2017-08-14 DIAGNOSIS — I38 Endocarditis, valve unspecified: Secondary | ICD-10-CM | POA: Diagnosis present

## 2017-08-14 MED ORDER — CHLORHEXIDINE GLUCONATE 0.12 % MT SOLN: OROMUCOSAL | 99 refills | Status: DC

## 2017-08-14 MED ORDER — METOPROLOL SUCCINATE ER 50 MG PO TB24: 50.0000 mg | ORAL_TABLET | Freq: Every day | ORAL | 6 refills | Status: DC

## 2017-08-14 MED FILL — CORLANOR 5 MG TABLET: 5 | 30 days supply | Qty: 60 | Fill #0

## 2017-08-14 NOTE — Patient Instructions (Signed)
Increase Metoprolol to 50 mg daily  Your physician recommends that you schedule a follow-up appointment in: 2 months with echocardiogram

## 2017-08-14 NOTE — Progress Notes (Signed)
Counseled [name] on indication, directions, side effects and interactions of Corlanor. Patient verbalized understanding.  [name] [name]. [name] [name], PharmD, BCPS, CPP Clinical Pharmacist Phone: [phone] 08/14/2017 3:57 PM

## 2017-08-14 NOTE — Patient Instructions (Addendum)
PLAN: 1. Patient will start chlorhexidine rinses after breakfast and at bedtime. Prescription was sent to [facility] with refills for one year. 2. Continue salt water rinses every 2 hours while awake in between the chlorhexidine rinses. 3. Brush teeth after meals and at bedtime. 4. Patient to follow-up with a dentist of his choice for continued comprehensive dental care once he is medically stable from his anticipated heart valve surgery.  Patient will require antibiotic premedication prior to invasive dental procedures after the heart valve surgery. 5. Patient is cleared for heart valve surgery at the discretion of Dr. Thomasene Lot, DDS

## 2017-08-14 NOTE — Progress Notes (Signed)
POST OPERATIVE NOTE:  08/14/2017 [name] [name] [id number]  VITALS: BP 111/66 (BP Location: Right Arm)   Pulse (!) 101   Temp 97.9 F (36.6 C)    LABS:  Lab Results  Component Value Date   WBC 7.6 08/03/2017   HGB 14.3 08/07/2017   HCT 42.0 08/07/2017   MCV 81.7 08/03/2017   PLT 235 08/03/2017   BMET    Component Value Date/Time   NA 141 08/07/2017 0851   NA 137 11/13/2013 0408   K 4.0 08/07/2017 0851   K 4.1 11/13/2013 0408   CL 98 (L) 08/03/2017 0250   CL 98 11/13/2013 0408   CO2 24 08/03/2017 0250   CO2 25 11/13/2013 0408   GLUCOSE 76 08/07/2017 0851   GLUCOSE 64 (L) 11/13/2013 0408   BUN 40 (H) 08/03/2017 0250   BUN 68 (H) 11/13/2013 0408   CREATININE 10.24 (H) 08/03/2017 0250   CREATININE 16.45 (H) 11/13/2013 0408   CALCIUM 8.6 (L) 08/03/2017 0250   CALCIUM 6.8 (LL) 11/13/2013 0408   GFRNONAA 5 (L) 08/03/2017 0250   GFRNONAA 3 (L) 11/13/2013 0408   GFRAA 6 (L) 08/03/2017 0250   GFRAA 3 (L) 11/13/2013 0408    Lab Results  Component Value Date   INR 1.38 08/01/2017   INR 1.26 03/16/2016   INR 1.25 11/13/2015   No results found for: PTT   [name] [name] is status post multiple extractions with alveoloplasty and gross debridement of remaining dentition in the operative room with general anesthesia on 08/07/2017.  The patient now presents for evaluation of healing and suture removal as needed.  SUBJECTIVE: The patient has minimal discomfort after dental extractions. Patient is primarily using pain medication at bedtime. Patient denies any active bleeding. Patient has several stitches that still remain.  EXAM: There is no sign of infection, heme, or ooze. Sutures are loosely intact. Patient is healing in by generalized primary closure with several areas that are healing in by secondary intention.  PROCEDURE: The patient was given a chlorhexidine gluconate rinse for 30 seconds. Sutures were then removed without complication. Sutures in the upper left  quadrant were left in place to assist in healing in that area.Patient tolerated the procedure well.  ASSESSMENT: Post operative course is consistent with dental procedures performed in the operating room with general anesthesia. Loss of teeth due to extraction Chronic periodontitis Multiple missing teeth Malocclusion  PLAN: 1. Patient will start chlorhexidine rinses after breakfast and at bedtime. Prescription was sent to [facility] with refills for one year. 2. Continue salt water rinses every 2 hours while awake in between the chlorhexidine rinses. 3. Brush teeth after meals and at bedtime. 4. Patient to follow-up with a dentist of his choice for continued comprehensive dental care once he is medically stable from his anticipated heart valve surgery.  Patient will require antibiotic premedication prior to invasive dental procedures after the heart valve surgery. 5. Patient is cleared for heart valve surgery at the discretion of Dr. Thomasene Lot, DDS

## 2017-08-14 NOTE — Progress Notes (Signed)
[facility] Cardiology: Dr. Aundra Dubin  52 yo with valvular heart disease, CAD, ESRD, and SVT presents for followup of aortic valve disease and CHF.  Patient has long history of ESRD, presumed due to HTN.  He was admitted in 4/19 with acute CHF.  Echo was done, showing EF 20-25% with the appearance of severe AI.  TEE confirmed EF 25-30% with severe AI/mild AS and moderate MR.  RHC/LHC showed serial 80% stenoses in the ostial and proximal LCx.  Volume was controlled with dialysis.  Patient has a long history of sinus tachycardia and has tolerated cardiac medications poorly due to low BP with HD.  He was started on ivabradine and low dose Toprol. XL.  He was noted to have runs of SVT with rates in the 140s-150s.    He is doing well symptomatically at this time.  No significant exertional dyspnea though not very active.  No orthopnea/PND. No tachypalpitations. No chest pain.   ECG (4/19, personally reviewed): sinus tachy at 108, anteroseptal Qs  PMH: 1. ESRD: TTS HD.  2. H/o SVT 3. CAD: LHC (4/19) with 40% mLAD, 95% ostial small D2, 70% mid small D3, 80% ostial/80% proximal LCx.   4. Chronic systolic CHF: TEE (1/61) with EF 25-30%, diffuse hypokinesis, trileaflet and calcified aortic valve with severe AI/moderate AS, moderate MR.  - RHC (4/19): mean RA 11, PA 53/24, mean PCWP 26, CI 2.67, PVR 1.6 WU 5. Valvular heart disease: Severe AI/mild AS and moderate MR on 4/19 TEE.  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  6. H/o sinus tachycardia  Social History   Socioeconomic History  . Marital status: Married    Spouse name: Not on file  . Number of children: Not on file  . Years of education: Not on file  . Highest education level: Not on file  Occupational History  . Occupation: disabled  Social Needs  . Financial resource strain: Not on file  . Food insecurity:    Worry: Not on file    Inability: Not on file  . Transportation needs:    Medical: Not on file    Non-medical: Not on  file  Tobacco Use  . Smoking status: Former Smoker    Types: Cigarettes    Last attempt to quit: 04/18/1994    Years since quitting: 23.3  . Smokeless tobacco: Current User    Types: Snuff  . Tobacco comment: dips 1/2 snuff per day times 25 years  Substance and Sexual Activity  . Alcohol use: No  . Drug use: No  . Sexual activity: Not on file  Lifestyle  . Physical activity:    Days per week: Not on file    Minutes per session: Not on file  . Stress: Not on file  Relationships  . Social connections:    Talks on phone: Not on file    Gets together: Not on file    Attends religious service: Not on file    Active member of club or organization: Not on file    Attends meetings of clubs or organizations: Not on file    Relationship status: Not on file  . Intimate partner violence:    Fear of current or ex partner: Not on file    Emotionally abused: Not on file    Physically abused: Not on file    Forced sexual activity: Not on file  Other Topics Concern  . Not on file  Social History Narrative  . Not on file   Family History  Problem Relation Age of Onset  . Hypertension Mother   . Heart disease Mother   . Hypertension Father   . Heart disease Father    ROS: All systems reviewed and negative except as per HPI.  Current Outpatient Medications  Medication Sig Dispense Refill  . atorvastatin (LIPITOR) 40 MG tablet Take 1 tablet (40 mg total) by mouth daily at 6 PM. 30 tablet 0  . B Complex-C-Folic Acid (NEPHRO-VITE PO) Take 1 tablet by mouth daily.     . benzonatate (TESSALON) 100 MG capsule 2 po tid for cough (Patient not taking: Reported on 08/14/2017) 30 capsule 0  . chlorhexidine (PERIDEX) 0.12 % solution Rinse with 15 mls twice daily for 30 seconds. Use after breakfast and at bedtime. Spit out excess. Do not swallow. (Patient taking differently: Use as directed 15 mLs in the mouth or throat 4 (four) times daily. For 30 seconds. Use after breakfast and at bedtime. Spit out  excess. Do not swallow.) 960 mL prn  . FOSRENOL 500 MG chewable tablet Chew 1,500-2,000 mg by mouth See admin instructions. Take 2000 mg by mouth with meals and take 1500 mg by mouth with snacks    . ivabradine (CORLANOR) 5 MG TABS tablet Take 1 tablet (5 mg total) by mouth 2 (two) times daily with a meal. (Patient taking differently: Take 5 mg by mouth daily. ) 60 tablet 3  . metoprolol succinate (TOPROL-XL) 50 MG 24 hr tablet Take 1 tablet (50 mg total) by mouth daily. 30 tablet 6  . midodrine (PROAMATINE) 5 MG tablet Take 5 mg by mouth See admin instructions. Take 5 mg by mouth daily on Tuesday, Thursday and Saturday.  4  . nitroGLYCERIN (NITROSTAT) 0.4 MG SL tablet Place 1 tablet (0.4 mg total) under the tongue every 5 (five) minutes as needed. For chest (Patient taking differently: Place 0.4 mg under the tongue every 5 (five) minutes as needed for chest pain. ) 45 tablet 3  . oxyCODONE-acetaminophen (PERCOCET) 5-325 MG tablet Take one or two tablets by mouth every 6 hours as needed for pain. (Patient taking differently: Take 1 tablet by mouth every 4 (four) hours as needed for severe pain. ) 24 tablet 0   No current facility-administered medications for this encounter.    BP (!) 143/71   Pulse 100   Wt (!) 314 lb 12 oz (142.8 kg)   SpO2 98%   BMI 42.69 kg/m  General: NAD Neck: No JVD, no thyromegaly or thyroid nodule.  Lungs: Clear to auscultation bilaterally with normal respiratory effort. CV: Nondisplaced PMI.  Heart regular S1/S2, no G3/O7, 2/6 diastolic murmur along sternal border.  No peripheral edema.   Abdomen: Soft, nontender, no hepatosplenomegaly, no distention.  Skin: Intact without lesions or rashes.  Neurologic: Alert and oriented x 3.  Psych: Normal affect. Extremities: No clubbing or cyanosis.  HEENT: Normal.   Assessment/Plan: 1. 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 No active vegetation seen. Blood cultures were negative during 4/19 hospitalization. Severe AI could certainly potentiate CHF. This was not see on 2012 echo. The LV is not significantly dilated, so not sure AI is the only explanation for his cardiomyopathy.  - He has been seen by [facility], will aortic valve replacement and possible MV repair.  Will need bypass of LCx system. He has had his teeth out. Surgery scheduled for 5/8.    2. Acute on ?  chronic systolic CHF: Echo 7/93 with EF 20-25%. As LV is not markedly dilated, I am not sure that aortic insufficiency can be invoked as the only cause for cardiomyopathy. He has had chronic sinus tachycardia x years but HR has been in 100s-110s, do not think this would have been likely to cause a tachy-mediated CMP. He has serial 80% stenoses in the LCx, but do not think this can explain all of the cardiomyopathy either.  It is possible that cardiomyopathy is multifactorial.  Volume status looks ok on exam today.  - Volume controlled by HD.  - Continue Corlanor 5 mg bid and with SVT, increased Toprol XL to 50 mg daily.  - Has not been needing midodrine with HD.   3. ESRD: TTS HD.   4. Suspected OSA: Needs sleep study => will arrange. 5. SVT: SVT in 150s in hospital, did not appear to be atrial flutter.  - Increase Toprol XL to 50 mg daily.    6. CAD: See cath above. Will need SVG-OM with valve surgery.  - He has been on Eliquis rather than aspirin apparently to keep his AV graft from clotting.  - Continue statin, check lipids/LFTs in 2 months.   Followup in 2 months with post-op echo.   Loralie Champagne 08/14/2017

## 2017-08-15 ENCOUNTER — Encounter: Payer: Self-pay | Admitting: *Deleted

## 2017-08-15 DIAGNOSIS — Z992 Dependence on renal dialysis: Secondary | ICD-10-CM | POA: Diagnosis not present

## 2017-08-15 DIAGNOSIS — N186 End stage renal disease: Secondary | ICD-10-CM | POA: Diagnosis not present

## 2017-08-15 DIAGNOSIS — D509 Iron deficiency anemia, unspecified: Secondary | ICD-10-CM | POA: Diagnosis not present

## 2017-08-15 DIAGNOSIS — Z006 Encounter for examination for normal comparison and control in clinical research program: Secondary | ICD-10-CM

## 2017-08-15 DIAGNOSIS — N2581 Secondary hyperparathyroidism of renal origin: Secondary | ICD-10-CM | POA: Diagnosis not present

## 2017-08-15 DIAGNOSIS — E611 Iron deficiency: Secondary | ICD-10-CM | POA: Diagnosis not present

## 2017-08-15 NOTE — Progress Notes (Signed)
[facility] Informed Consent           Subject Name:  [name] [name]    Subject met inclusion and exclusion criteria.  The informed consent form, study requirements and expectations were reviewed with the subject and questions and concerns were addressed prior to the signing of the consent form.  The subject verbalized understanding of the trial requirements.  The subject agreed to participate in the [facility] trial and signed the informed consent.  The informed consent was obtained prior to performance of any protocol-specific procedures for the subject.  A copy of the signed informed consent was given to the subject and a copy was placed in the subject's medical record.   [place] Hulda Reddix, Research Assistant 08/14/2017   10:44 a.m.

## 2017-08-17 DIAGNOSIS — E611 Iron deficiency: Secondary | ICD-10-CM | POA: Diagnosis not present

## 2017-08-17 DIAGNOSIS — N2581 Secondary hyperparathyroidism of renal origin: Secondary | ICD-10-CM | POA: Diagnosis not present

## 2017-08-17 DIAGNOSIS — N186 End stage renal disease: Secondary | ICD-10-CM | POA: Diagnosis not present

## 2017-08-17 DIAGNOSIS — Z992 Dependence on renal dialysis: Secondary | ICD-10-CM | POA: Diagnosis not present

## 2017-08-18 ENCOUNTER — Other Ambulatory Visit (HOSPITAL_COMMUNITY): Payer: Self-pay | Admitting: Urology

## 2017-08-21 ENCOUNTER — Ambulatory Visit: Payer: Medicare Other | Admitting: Thoracic Surgery (Cardiothoracic Vascular Surgery)

## 2017-08-21 ENCOUNTER — Encounter (HOSPITAL_COMMUNITY): Payer: Self-pay

## 2017-08-21 ENCOUNTER — Ambulatory Visit (HOSPITAL_BASED_OUTPATIENT_CLINIC_OR_DEPARTMENT_OTHER)
Admission: RE | Admit: 2017-08-21 | Discharge: 2017-08-21 | Disposition: A | Discharge status: Home / Self Care | Payer: Medicare Other | Source: Ambulatory Visit | Attending: Thoracic Surgery (Cardiothoracic Vascular Surgery) | Admitting: Thoracic Surgery (Cardiothoracic Vascular Surgery)

## 2017-08-21 ENCOUNTER — Ambulatory Visit (INDEPENDENT_AMBULATORY_CARE_PROVIDER_SITE_OTHER): Payer: Medicare Other | Admitting: Thoracic Surgery (Cardiothoracic Vascular Surgery)

## 2017-08-21 ENCOUNTER — Ambulatory Visit (HOSPITAL_COMMUNITY)
Admission: RE | Admit: 2017-08-21 | Discharge: 2017-08-21 | Disposition: A | Discharge status: Home / Self Care | Payer: Medicare Other | Source: Ambulatory Visit | Attending: Thoracic Surgery (Cardiothoracic Vascular Surgery) | Admitting: Thoracic Surgery (Cardiothoracic Vascular Surgery)

## 2017-08-21 ENCOUNTER — Other Ambulatory Visit: Payer: Self-pay

## 2017-08-21 ENCOUNTER — Encounter: Payer: Self-pay | Admitting: Thoracic Surgery (Cardiothoracic Vascular Surgery)

## 2017-08-21 ENCOUNTER — Encounter (HOSPITAL_COMMUNITY)
Admit: 2017-08-21 | Discharge: 2017-08-21 | Disposition: A | Discharge status: Home / Self Care | Payer: Medicare Other | Attending: Thoracic Surgery (Cardiothoracic Vascular Surgery) | Admitting: Thoracic Surgery (Cardiothoracic Vascular Surgery)

## 2017-08-21 VITALS — BP 102/60 | HR 95 | Resp 18 | Ht 72.0 in | Wt 320.4 lb

## 2017-08-21 DIAGNOSIS — Z992 Dependence on renal dialysis: Secondary | ICD-10-CM | POA: Insufficient documentation

## 2017-08-21 DIAGNOSIS — I340 Nonrheumatic mitral (valve) insufficiency: Secondary | ICD-10-CM | POA: Diagnosis not present

## 2017-08-21 DIAGNOSIS — I509 Heart failure, unspecified: Secondary | ICD-10-CM | POA: Insufficient documentation

## 2017-08-21 DIAGNOSIS — I061 Rheumatic aortic insufficiency: Secondary | ICD-10-CM | POA: Diagnosis not present

## 2017-08-21 DIAGNOSIS — Z01818 Encounter for other preprocedural examination: Secondary | ICD-10-CM | POA: Insufficient documentation

## 2017-08-21 DIAGNOSIS — N186 End stage renal disease: Secondary | ICD-10-CM | POA: Insufficient documentation

## 2017-08-21 DIAGNOSIS — J984 Other disorders of lung: Secondary | ICD-10-CM | POA: Insufficient documentation

## 2017-08-21 DIAGNOSIS — Z01810 Encounter for preprocedural cardiovascular examination: Secondary | ICD-10-CM | POA: Diagnosis not present

## 2017-08-21 DIAGNOSIS — I2510 Atherosclerotic heart disease of native coronary artery without angina pectoris: Secondary | ICD-10-CM

## 2017-08-21 DIAGNOSIS — I351 Nonrheumatic aortic (valve) insufficiency: Secondary | ICD-10-CM

## 2017-08-21 DIAGNOSIS — Z01812 Encounter for preprocedural laboratory examination: Secondary | ICD-10-CM | POA: Diagnosis not present

## 2017-08-21 DIAGNOSIS — I5022 Chronic systolic (congestive) heart failure: Secondary | ICD-10-CM

## 2017-08-21 DIAGNOSIS — I132 Hypertensive heart and chronic kidney disease with heart failure and with stage 5 chronic kidney disease, or end stage renal disease: Secondary | ICD-10-CM | POA: Diagnosis not present

## 2017-08-21 HISTORY — DX: Atherosclerotic heart disease of native coronary artery without angina pectoris: I25.10

## 2017-08-21 LAB — PULMONARY FUNCTION TEST
DL/VA % pred: 85 %
DL/VA: 4.06 ml/min/mmHg/L
DLCO COR: 22.19 ml/min/mmHg
DLCO UNC: 21.19 ml/min/mmHg
DLCO cor % pred: 63 %
DLCO unc % pred: 60 %
FEF 25-75 POST: 3.27 L/s
FEF 25-75 Pre: 3.03 L/sec
FEF2575-%Change-Post: 7 %
FEF2575-%PRED-PRE: 88 %
FEF2575-%Pred-Post: 95 %
FEV1-%Change-Post: 2 %
FEV1-%PRED-POST: 92 %
FEV1-%Pred-Pre: 90 %
FEV1-POST: 3.27 L
FEV1-Pre: 3.21 L
FEV1FVC-%CHANGE-POST: 2 %
FEV1FVC-%Pred-Pre: 100 %
FEV6-%CHANGE-POST: 0 %
FEV6-%PRED-POST: 91 %
FEV6-%PRED-PRE: 92 %
FEV6-POST: 3.98 L
FEV6-PRE: 4 L
FEV6FVC-%PRED-POST: 103 %
FEV6FVC-%PRED-PRE: 103 %
FVC-%Change-Post: 0 %
FVC-%PRED-POST: 89 %
FVC-%Pred-Pre: 89 %
FVC-Post: 3.98 L
FVC-Pre: 4 L
POST FEV6/FVC RATIO: 100 %
PRE FEV1/FVC RATIO: 80 %
Post FEV1/FVC ratio: 82 %
Pre FEV6/FVC Ratio: 100 %
RV % pred: 177 %
RV: 3.91 L
TLC % PRED: 106 %
TLC: 7.86 L

## 2017-08-21 LAB — BLOOD GAS, ARTERIAL
ACID-BASE EXCESS: 1.2 mmol/L (ref 0.0–2.0)
Bicarbonate: 25.2 mmol/L (ref 20.0–28.0)
DRAWN BY: 449841
FIO2: 21
O2 SAT: 95.7 %
PCO2 ART: 38.9 mmHg (ref 32.0–48.0)
Patient temperature: 98.6
pH, Arterial: 7.427 (ref 7.350–7.450)
pO2, Arterial: 84.7 mmHg (ref 83.0–108.0)

## 2017-08-21 LAB — TYPE AND SCREEN
ABO/RH(D): B POS
Antibody Screen: NEGATIVE

## 2017-08-21 LAB — APTT: aPTT: 41 seconds — ABNORMAL HIGH (ref 24–36)

## 2017-08-21 LAB — CBC
HEMATOCRIT: 42 % (ref 39.0–52.0)
Hemoglobin: 13.1 g/dL (ref 13.0–17.0)
MCH: 25.2 pg — ABNORMAL LOW (ref 26.0–34.0)
MCHC: 31.2 g/dL (ref 30.0–36.0)
MCV: 80.9 fL (ref 78.0–100.0)
PLATELETS: 263 10*3/uL (ref 150–400)
RBC: 5.19 MIL/uL (ref 4.22–5.81)
RDW: 15 % (ref 11.5–15.5)
WBC: 8.2 10*3/uL (ref 4.0–10.5)

## 2017-08-21 LAB — COMPREHENSIVE METABOLIC PANEL
ALT: 9 U/L — ABNORMAL LOW (ref 17–63)
ANION GAP: 19 — AB (ref 5–15)
AST: 15 U/L (ref 15–41)
Albumin: 3 g/dL — ABNORMAL LOW (ref 3.5–5.0)
Alkaline Phosphatase: 97 U/L (ref 38–126)
BILIRUBIN TOTAL: 0.7 mg/dL (ref 0.3–1.2)
BUN: 62 mg/dL — AB (ref 6–20)
CHLORIDE: 97 mmol/L — AB (ref 101–111)
CO2: 23 mmol/L (ref 22–32)
Calcium: 8.9 mg/dL (ref 8.9–10.3)
Creatinine, Ser: 15.16 mg/dL — ABNORMAL HIGH (ref 0.61–1.24)
GFR calc Af Amer: 4 mL/min — ABNORMAL LOW (ref >60)
GFR, EST NON AFRICAN AMERICAN: 3 mL/min — AB (ref >60)
Glucose, Bld: 76 mg/dL (ref 65–99)
POTASSIUM: 4.8 mmol/L (ref 3.5–5.1)
Sodium: 139 mmol/L (ref 135–145)
TOTAL PROTEIN: 7.2 g/dL (ref 6.5–8.1)

## 2017-08-21 LAB — HEMOGLOBIN A1C
HEMOGLOBIN A1C: 5 % (ref 4.8–5.6)
Mean Plasma Glucose: 96.8 mg/dL

## 2017-08-21 LAB — ABO/RH: ABO/RH(D): B POS

## 2017-08-21 LAB — PROTIME-INR
INR: 1.13
Prothrombin Time: 14.4 seconds (ref 11.4–15.2)

## 2017-08-21 MED ORDER — ALBUTEROL SULFATE (2.5 MG/3ML) 0.083% IN NEBU
2.5000 mg | INHALATION_SOLUTION | Freq: Once | RESPIRATORY_TRACT | Status: AC
  Administered 2017-08-21: 2.5 mg via RESPIRATORY_TRACT

## 2017-08-21 NOTE — Progress Notes (Addendum)
PCP: none Cardiologist: Dr. Bronson Ing and Dr. Aundra Dubin Nephrologist: Dr. Berton Bon  Pt. Will try to bring urine DOS, he's renal patient and rarely voids. Stated today he knew he couldn't give a sample.   Pt. Had nasal swab 07/29/17, positive MRSA. Stated he was treated with mupirocin. Will obtain another swab DOS.  Notified Levonne Spiller RN, of pt's abnormal lab values/cxr pending/and pt. Not able to give urine sample, do to being a dialysis pt. Told her I gave him a sterile cup to take home and give  A sample the day of surgery. She stated she would inform Dr. Roxy Manns.

## 2017-08-21 NOTE — Patient Instructions (Signed)
   Continue taking all current medications without change through the day before surgery.  Have nothing to eat or drink after midnight the night before surgery.  On the morning of surgery do not take any of your regular medications.

## 2017-08-21 NOTE — Progress Notes (Signed)
Pre-op Cardiac Surgery  Carotid Findings:  No ICA stenosis. Vertebral artery flow is antegrade.   Upper Extremity Right Left  Brachial Pressures 103T Dialysis access  Radial Waveforms T T  Ulnar Waveforms T T  Palmar Arch (Allen's Test) WNL WNL

## 2017-08-21 NOTE — H&P (View-Only) (Signed)
[address]       , [id number]             [place] REPORT  Referring Provider is Larey Dresser, MD  Primary Cardiologist is Herminio Commons, MD Primary Nephrologist is Fran Lowes, MD PCP is System, Provider Not In  Chief Complaint  Patient presents with  . 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    The patient was originally seen in consultation during his recent hospitalization for acute exacerbation of chronic combined systolic and diastolic congestive heart failure.  He has long-standing history of severe hypertension and approximately 20 years ago he began to develop symptoms of chronic diastolic congestive heart failure in the setting of progressive renal failure.  He ultimately became dialysis dependent approximately 17 years ago.  An echocardiogram performed 12 years ago revealed normal left ventricular systolic function with no mention of any significant valvular disease.  Recently the patient developed persistent dry nonproductive cough and orthopnea.  He was referred for cardiology consultation and evaluated by Dr. Bronson Ing 2 weeks ago.  Transthoracic echocardiogram performed April 29, 2017 revealed severe left ventricular systolic dysfunction with ejection fraction estimated 20-25%.  There was at least mild to moderate aortic insufficiency and question of possible vegetation beneath the aortic valve in the left ventricular outflow tract.  There was mild mitral regurgitation.  The patient was contacted via telephone  and brought in for hospital admission for further workup and therapy.  Multiple sets of blood cultures were obtained and remain no growth to date.  Transesophageal echocardiogram performed July 31, 2017 revealed severe aortic insufficiency with no clear evidence of vegetation.  There was moderate mitral regurgitation.  There was severe left ventricular systolic dysfunction with ejection fraction estimated 25-30%.  There was significant left ventricular hypertrophy.  Left atrium was dilated.  Cardiothoracic surgical consultation was requested.  The patient subsequently underwent left and right heart catheterization on August 02, 2017.  He was found to have multivessel coronary artery disease including high-grade proximal stenosis of the left circumflex coronary artery.  There was moderate pulmonary hypertension with PA pressures measured 53/24 with mean pulmonary capillary wedge pressure 26 mmHg and central venous pressure 11 mmHg.  There were no large V waves seen on wedge tracing.  Forward cardiac output was preserved with mixed venous oxygen saturation 61% at baseline.  The patient subsequently was seen in consultation by the dental service and underwent dental extraction.  He was eventually discharged home and returns the office today with tentative plans to proceed with elective surgical intervention next week.  Patient is married and lives with his wife in [place].  He has been disabled since he went on hemodialysis approximately 17 years ago.  He still drives an automobile and remains reasonably functional.  He has been morbidly obese for all of his adult life.  He states that he ambulates without significant difficulty.  He denies any symptoms of shortness of breath.  He has not been having problems with dialysis treatments recently.  He denies any history of fevers, chills, or night sweats.  Appetite  is stable.  He complains of a dry persistent cough that is made worse laying flat in bed.  He has had  episodes of PND.  He denies palpitations, dizzy spells, or syncope.  He has not had lower extremity edema.  He dialyzes on a Tuesday, Thursday, Saturday schedule at the [facility] dialysis center via a left forearm primary AV fistula.  He has not had any problems with dialysis access for the past 2 years.  Prior to his recent hospitalization the patient had been anticoagulated chronically using Eliquis because of previous history of clotting of dialysis grafts.  Eliquis was not resumed at the time of hospital discharge.  The patient reports no new problems or complaints since hospital discharge.  He states that his breathing has been stable.  Dialysis treatments have been uneventful.  He has not had any fevers or chills.  Final report of blood cultures obtained during his recent hospitalization were no growth.    Past Medical History:  Diagnosis Date  . Aneurysm ([place])     Right arm fistula 3 aneurysms 2011,   plans to have a new procedure  . Angina   . Chest discomfort   . CHF (congestive heart failure) ([place])   . Coronary artery disease   . Dialysis patient ([place])   . Dysrhythmia   . ESRD (end stage renal disease) ([place])    on hemodialysis T_T_S  . Hypertension   . Leg pain   . Mitral regurgitation   . Morbid obesity ([place])   . Orthostatic hypotension   . Overweight(278.02)   . Severe aortic insufficiency   . Sinus tachycardia   . Syncope     positional after dialysis... 2008    Past Surgical History:  Procedure Laterality Date  . A/V FISTULAGRAM Left 06/30/2016   Procedure: A/V Fistulagram;  Surgeon: Algernon Huxley, MD;  Location: [place] [facility];  Service: Cardiovascular;  Laterality: Left;  . ARTERIOVENOUS GRAFT PLACEMENT    . AV FISTULA PLACEMENT    . AV FISTULA PLACEMENT Left 11/18/2015   Procedure: ARTERIOVENOUS (AV) FISTULA CREATION ( RADIOCEPHALIC );  Surgeon: Algernon Huxley, MD;  Location: [facility];  Service: Vascular;  Laterality: Left;  . AV FISTULA PLACEMENT Left 03/23/2016    Procedure: ARTERIOVENOUS (AV) FISTULA CREATION ( REVISION );  Surgeon: Algernon Huxley, MD;  Location: [facility];  Service: Vascular;  Laterality: Left;  . [place] REMOVAL Left 03/23/2016   Procedure: REMOVAL OF ARTERIOVENOUS GORETEX GRAFT ([place]);  Surgeon: Algernon Huxley, MD;  Location: [facility];  Service: Vascular;  Laterality: Left;  . DIALYSIS/PERMA CATHETER REMOVAL N/A 08/01/2016   Procedure: Dialysis/Perma Catheter Removal;  Surgeon: Algernon Huxley, MD;  Location: [place] [facility];  Service: Cardiovascular;  Laterality: N/A;  . HERNIA REPAIR    . INSERTION OF DIALYSIS CATHETER  03/01/2011   Procedure: INSERTION OF DIALYSIS CATHETER;  Surgeon: Elam Dutch, MD;  Location: [place];  Service: Vascular;  Laterality: Left;  Exchange of Dialysis Catheter to 27cm 15Fr. Arrow Catheter  . MULTIPLE EXTRACTIONS WITH ALVEOLOPLASTY N/A 08/07/2017   Procedure: Extraction of tooth #2, 7,8,13,14,15,17, and 31 with alveoloplasty and gross debridement of remaining teeth;  Surgeon: [name], DDS;  Location: [place];  Service: Oral Surgery;  Laterality: N/A;  . PERIPHERAL VASCULAR CATHETERIZATION N/A 09/01/2014   Procedure: A/V Shuntogram/Fistulagram;  Surgeon: Algernon Huxley, MD;  Location: [place] [facility];  Service: Cardiovascular;  Laterality: N/A;  . PERIPHERAL VASCULAR CATHETERIZATION Right 09/01/2014  Procedure: Thrombectomy;  Surgeon: Algernon Huxley, MD;  Location: [place] [facility];  Service: Cardiovascular;  Laterality: Right;  . PERIPHERAL VASCULAR CATHETERIZATION Right 09/01/2014   Procedure: A/V Shunt Intervention;  Surgeon: Algernon Huxley, MD;  Location: [place] [facility];  Service: Cardiovascular;  Laterality: Right;  . PERIPHERAL VASCULAR CATHETERIZATION N/A 09/17/2014   Procedure: A/V Shuntogram/Fistulagram;  Surgeon: Algernon Huxley, MD;  Location: [place] [facility];  Service: Cardiovascular;  Laterality: N/A;  . PERIPHERAL VASCULAR CATHETERIZATION Right 10/17/2014   Procedure: Thrombectomy;  Surgeon:  Katha Cabal, MD;  Location: [place] [facility];  Service: Cardiovascular;  Laterality: Right;  . PERIPHERAL VASCULAR CATHETERIZATION N/A 10/17/2014   Procedure: A/V Shuntogram/Fistulagram;  Surgeon: Katha Cabal, MD;  Location: [place] [facility];  Service: Cardiovascular;  Laterality: N/A;  . PERIPHERAL VASCULAR CATHETERIZATION N/A 10/17/2014   Procedure: A/V Shunt Intervention;  Surgeon: Katha Cabal, MD;  Location: [place] [facility];  Service: Cardiovascular;  Laterality: N/A;  . PERIPHERAL VASCULAR CATHETERIZATION Right 11/04/2014   Procedure: Thrombectomy;  Surgeon: Katha Cabal, MD;  Location: [place] [facility];  Service: Cardiovascular;  Laterality: Right;  . PERIPHERAL VASCULAR CATHETERIZATION Left 11/04/2014   Procedure: Visceral Venography;  Surgeon: Katha Cabal, MD;  Location: [place] [facility];  Service: Cardiovascular;  Laterality: Left;  . PERIPHERAL VASCULAR CATHETERIZATION Right 11/27/2014   Procedure: A/V Shuntogram/Fistulagram;  Surgeon: Algernon Huxley, MD;  Location: [place] [facility];  Service: Cardiovascular;  Laterality: Right;  . PERIPHERAL VASCULAR CATHETERIZATION Right 11/27/2014   Procedure: A/V Shunt Intervention;  Surgeon: Algernon Huxley, MD;  Location: [place] [facility];  Service: Cardiovascular;  Laterality: Right;  . PERIPHERAL VASCULAR CATHETERIZATION Right 12/31/2014   Procedure: Thrombectomy;  Surgeon: Algernon Huxley, MD;  Location: [place] [facility];  Service: Cardiovascular;  Laterality: Right;  . PERIPHERAL VASCULAR CATHETERIZATION Right 01/12/2015   Procedure: A/V Shuntogram/Fistulagram;  Surgeon: Algernon Huxley, MD;  Location: [place] [facility];  Service: Cardiovascular;  Laterality: Right;  . PERIPHERAL VASCULAR CATHETERIZATION N/A 01/12/2015   Procedure: A/V Shunt Intervention;  Surgeon: Algernon Huxley, MD;  Location: [place] [facility];  Service: Cardiovascular;  Laterality: N/A;  . PERIPHERAL VASCULAR CATHETERIZATION Right  01/28/2015   Procedure: Thrombectomy;  Surgeon: Algernon Huxley, MD;  Location: [place] [facility];  Service: Cardiovascular;  Laterality: Right;  . PERIPHERAL VASCULAR CATHETERIZATION N/A 06/08/2015   Procedure: A/V Shuntogram/Fistulagram;  Surgeon: Algernon Huxley, MD;  Location: [place] [facility];  Service: Cardiovascular;  Laterality: N/A;  . PERIPHERAL VASCULAR CATHETERIZATION N/A 06/08/2015   Procedure: A/V Shunt Intervention;  Surgeon: Algernon Huxley, MD;  Location: [place] [facility];  Service: Cardiovascular;  Laterality: N/A;  . PERIPHERAL VASCULAR CATHETERIZATION Right 07/06/2015   Procedure: A/V Shuntogram/Fistulagram;  Surgeon: Algernon Huxley, MD;  Location: [place] [facility];  Service: Cardiovascular;  Laterality: Right;  . PERIPHERAL VASCULAR CATHETERIZATION N/A 07/06/2015   Procedure: A/V Shunt Intervention;  Surgeon: Algernon Huxley, MD;  Location: [place] [facility];  Service: Cardiovascular;  Laterality: N/A;  . PERIPHERAL VASCULAR CATHETERIZATION N/A 08/04/2015   Procedure: graft declot;  Surgeon: Katha Cabal, MD;  Location: [place] [facility];  Service: Cardiovascular;  Laterality: N/A;  . PERIPHERAL VASCULAR CATHETERIZATION Right 08/25/2015   Procedure: A/V Shuntogram/Fistulagram;  Surgeon: Katha Cabal, MD;  Location: [place] [facility];  Service: Cardiovascular;  Laterality: Right;  . PERIPHERAL VASCULAR CATHETERIZATION N/A  11/04/2015   Procedure: Dialysis/Perma Catheter Insertion;  Surgeon: Algernon Huxley, MD;  Location: [place] [facility];  Service: Cardiovascular;  Laterality: N/A;  . PERIPHERAL VASCULAR CATHETERIZATION Left 12/14/2015   Procedure: A/V Shuntogram/Fistulagram;  Surgeon: Algernon Huxley, MD;  Location: [place] [facility];  Service: Cardiovascular;  Laterality: Left;  . PERIPHERAL VASCULAR CATHETERIZATION N/A 12/14/2015   Procedure: A/V Shunt Intervention;  Surgeon: Algernon Huxley, MD;  Location: [place] [facility];  Service: Cardiovascular;  Laterality: N/A;    . PERIPHERAL VASCULAR CATHETERIZATION N/A 12/17/2015   Procedure: Dialysis/Perma Catheter Insertion;  Surgeon: Algernon Huxley, MD;  Location: [place] [facility];  Service: Cardiovascular;  Laterality: N/A;  . PERIPHERAL VASCULAR CATHETERIZATION Left 12/22/2015   Procedure: Dialysis/Perma Catheter Insertion;  Surgeon: Katha Cabal, MD;  Location: [place] [facility];  Service: Cardiovascular;  Laterality: Left;  . PERIPHERAL VASCULAR CATHETERIZATION Left 02/29/2016   Procedure: A/V Shuntogram/Fistulagram;  Surgeon: Algernon Huxley, MD;  Location: [place] [facility];  Service: Cardiovascular;  Laterality: Left;  . PERIPHERAL VASCULAR CATHETERIZATION N/A 02/29/2016   Procedure: A/V Shunt Intervention;  Surgeon: Algernon Huxley, MD;  Location: [place] [facility];  Service: Cardiovascular;  Laterality: N/A;  . right arm graft     for dyalisis  . RIGHT/LEFT HEART CATH AND CORONARY ANGIOGRAPHY N/A 08/02/2017   Procedure: RIGHT/LEFT HEART CATH AND CORONARY ANGIOGRAPHY;  Surgeon: Larey Dresser, MD;  Location: [place] [facility];  Service: Cardiovascular;  Laterality: N/A;  . TEE WITHOUT CARDIOVERSION N/A 07/31/2017   Procedure: TRANSESOPHAGEAL ECHOCARDIOGRAM (TEE);  Surgeon: Josue Hector, MD;  Location: [facility];  Service: Cardiovascular;  Laterality: N/A;  . THROMBECTOMY    . THROMBECTOMY W/ EMBOLECTOMY  03/01/2011   Procedure: THROMBECTOMY ARTERIOVENOUS GORE-TEX GRAFT;  Surgeon: Elam Dutch, MD;  Location: [place];  Service: Vascular;  Laterality: Right;  Attempted Thrombectomy of Old  Right Upper Arm Arteriovenous gortex Graft. Insertion of new Arteriovenous Graft using 71mm x 50cm Gortex Stretch graft.   . THROMBECTOMY W/ EMBOLECTOMY  07/11/2011   Procedure: THROMBECTOMY ARTERIOVENOUS GORE-TEX GRAFT;  Surgeon: Rosetta Posner, MD;  Location: [place];  Service: Vascular;  Laterality: Right;  . UMBILICAL HERNIA REPAIR    . VENOGRAM N/A 08/23/2011   Procedure: VENOGRAM;  Surgeon: [name], MD;   Location: [facility] CATH LAB;  Service: Cardiovascular;  Laterality: N/A;    Family History  Problem Relation Age of Onset  . Hypertension Mother   . Heart disease Mother   . Hypertension Father   . Heart disease Father     Social History   Socioeconomic History  . Marital status: Married    Spouse name: Not on file  . Number of children: Not on file  . Years of education: Not on file  . Highest education level: Not on file  Occupational History  . Occupation: disabled  Social Needs  . Financial resource strain: Not on file  . Food insecurity:    Worry: Not on file    Inability: Not on file  . Transportation needs:    Medical: Not on file    Non-medical: Not on file  Tobacco Use  . Smoking status: Former Smoker    Types: Cigarettes    Last attempt to quit: 04/18/1994    Years since quitting: 23.3  . Smokeless tobacco: Current User    Types: Snuff  . Tobacco comment: dips 1/2 snuff per day times 25 years  Substance and  Sexual Activity  . Alcohol use: No  . Drug use: No  . Sexual activity: Not on file  Lifestyle  . Physical activity:    Days per week: Not on file    Minutes per session: Not on file  . Stress: Not on file  Relationships  . Social connections:    Talks on phone: Not on file    Gets together: Not on file    Attends religious service: Not on file    Active member of club or organization: Not on file    Attends meetings of clubs or organizations: Not on file    Relationship status: Not on file  . Intimate partner violence:    Fear of current or ex partner: Not on file    Emotionally abused: Not on file    Physically abused: Not on file    Forced sexual activity: Not on file  Other Topics Concern  . Not on file  Social History Narrative  . Not on file    Current Outpatient Medications  Medication Sig Dispense Refill  . atorvastatin (LIPITOR) 40 MG tablet Take 1 tablet (40 mg total) by mouth daily at 6 PM. 30 tablet 0  . B Complex-C-Folic Acid  (NEPHRO-VITE PO) Take 1 tablet by mouth daily.     . benzonatate (TESSALON) 100 MG capsule 2 po tid for cough 30 capsule 0  . chlorhexidine (PERIDEX) 0.12 % solution Rinse with 15 mls twice daily for 30 seconds. Use after breakfast and at bedtime. Spit out excess. Do not swallow. (Patient taking differently: Use as directed 15 mLs in the mouth or throat 4 (four) times daily. For 30 seconds. Use after breakfast and at bedtime. Spit out excess. Do not swallow.) 960 mL prn  . FOSRENOL 500 MG chewable tablet Chew 1,500-2,000 mg by mouth See admin instructions. Take 2000 mg by mouth with meals and take 1500 mg by mouth with snacks    . ivabradine (CORLANOR) 5 MG TABS tablet Take 1 tablet (5 mg total) by mouth 2 (two) times daily with a meal. (Patient taking differently: Take 5 mg by mouth daily. ) 60 tablet 3  . metoprolol succinate (TOPROL-XL) 50 MG 24 hr tablet Take 1 tablet (50 mg total) by mouth daily. 30 tablet 6  . midodrine (PROAMATINE) 5 MG tablet Take 5 mg by mouth See admin instructions. Take 5 mg by mouth daily on Tuesday, Thursday and Saturday.  4  . nitroGLYCERIN (NITROSTAT) 0.4 MG SL tablet Place 1 tablet (0.4 mg total) under the tongue every 5 (five) minutes as needed. For chest (Patient taking differently: Place 0.4 mg under the tongue every 5 (five) minutes as needed for chest pain. ) 45 tablet 3  . oxyCODONE-acetaminophen (PERCOCET) 5-325 MG tablet Take one or two tablets by mouth every 6 hours as needed for pain. (Patient taking differently: Take 1 tablet by mouth every 4 (four) hours as needed for severe pain. ) 24 tablet 0   No current facility-administered medications for this visit.     No Known Allergies    Review of Systems:              General:                      normal appetite, decreased energy, no weight gain, no weight loss, no fever             Cardiac:  no chest pain with exertion, no chest pain at rest, +SOB with exertion, no resting SOB, + PND,  + orthopnea, no palpitations, no arrhythmia, no atrial fibrillation, no LE edema, no dizzy spells, no syncope             Respiratory:                 no shortness of breath, no home oxygen, no productive cough, + persistent dry cough, no bronchitis, no wheezing, no hemoptysis, no asthma, no pain with inspiration or cough, possible sleep apnea, no CPAP at night             GI:                               no difficulty swallowing, no reflux, no frequent heartburn, no hiatal hernia, no abdominal pain, no constipation, no diarrhea, no hematochezia, no hematemesis, no melena             GU:                              Minimal UOP, no dysuria,  no frequency, no urinary tract infection, no hematuria, no enlarged prostate, no kidney stones, + kidney disease             Vascular:                     no pain suggestive of claudication, no pain in feet, no leg cramps, + varicose veins, no DVT, no non-healing foot ulcer             Neuro:                         no stroke, no TIA's, no seizures, no headaches, no temporary blindness one eye,  no slurred speech, no peripheral neuropathy, no chronic pain, mild instability of gait, no memory/cognitive dysfunction             Musculoskeletal:         no arthritis , no joint swelling, no myalgias, no difficulty walking, normal mobility              Skin:                            no rash, no itching, no skin infections, no pressure sores or ulcerations             Psych:                         no anxiety, no depression, no nervousness, no unusual recent stress             Eyes:                           no blurry vision, no floaters, no recent vision changes, does not wear glasses or contacts             ENT:                            no hearing loss, no loose or painful teeth, no dentures, last saw dentist > 20 years ago  Hematologic:               no easy bruising, no abnormal bleeding, no clotting disorder, no frequent epistaxis             Endocrine:                    no diabetes, does not check CBG's at home     Physical Exam:   BP 102/60 (BP Location: Right Arm, Patient Position: Sitting, Cuff Size: Large)   Pulse 95   Resp 18   Ht 6' (1.829 m)   Wt (!) 320 lb 6.4 oz (145.3 kg)   SpO2 96% Comment: RA  BMI 43.45 kg/m              General:                      Morbidly obese male NAD               HEENT:                       Unremarkable              Neck:                           no JVD, no bruits, no adenopathy              Chest:                          clear to auscultation, symmetrical breath sounds, no wheezes, no rhonchi              CV:                              RRR, grade III/VI diastolic murmur best RSB             Abdomen:                    soft, non-tender, no masses              Extremities:                 warm, well-perfused, pulses not palpable, no lower extremity edema             Rectal/GU                   Deferred             Neuro:                         Grossly non-focal and symmetrical throughout             Skin:                            Clean and dry, no rashes, no breakdown    Diagnostic Tests:  Transthoracic Echocardiography  Patient: [name] [name], Krummel MR #: [id number] Study Date: 07/28/2017 Gender: M Age: 36 Height: 182.9 cm Weight: 147.9 kg BSA: 2.81 m^2 Pt. Status: Room:  SONOGRAPHER [facility], RVT, [place], MD [place], MD [place], MD PERFORMING [facility], [place]  cc:  ------------------------------------------------------------------- LV  EF: 20% - 25%  ------------------------------------------------------------------- History: PMH: Tachycardia and syncope. Congestive heart failure. Risk factors: Former tobacco use.  ------------------------------------------------------------------- Study Conclusions  - Procedure narrative: Transthoracic  echocardiography. Image quality was adequate. The study was technically difficult, as a result of poor acoustic windows, poor sound wave transmission, and body habitus. - Left ventricle: Wall thickness was increased in a pattern of mild LVH. Systolic function was severely reduced. The estimated ejection fraction was in the range of 20% to 25%. The study is not technically sufficient to allow evaluation of LV diastolic function. - Aortic valve: Moderately calcified annulus. Trileaflet; mildly calcified leaflets. There was mild to moderate regurgitation - not well defined. Large, globular and calcified density noted in LVOT just proximal to the aortic valve. 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 Due to large size this appears to obstruct flow through the LVOT which could be impacting cardiac output and therefore resulting tachycardia. High suspicion for valvular vegetation, although could be more chronic finding due to calcification. TEE is indicated to evaluate further. - Mitral valve: Moderately to severely calcified annulus. There was mild regurgitation. Valve area by continuity equation (using LVOT flow): 2.45 cm^2. - Left atrium: The atrium was moderately dilated. - Right ventricle: Poorly visualized. Systolic function was reduced. - Tricuspid valve: Mildly thickened leaflets. There was mild regurgitation. Peak RV-RA gradient (S): 23 mm Hg. - Pericardium, extracardiac: There was no pericardial effusion.  Impressions:  - Large, globular and calcified density noted in LVOT just proximal to the aortic valve. 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 Due to large size this appears to obstruct flow  through the LVOT which could be impacting cardiac output and therefore resulting tachycardia. High suspicion for valvular vegetation, although could be more chronic finding due to calcification. TEE is indicated to evaluate further. Ordering provider out of the office. Patient being contacted by nursing and directed to ER at [facility] in anticipation of admission for further evaluation of both cardiomyopathy and potential endocarditis.  ------------------------------------------------------------------- Study data: Comparison was made to the study of July 2012. LVEF 60%, no RWMA. Study status: Routine. Procedure: Transthoracic echocardiography. Image quality was adequate. The study was technically difficult, as a result of poor acoustic windows, poor sound wave transmission, and body habitus. Study completion: There were no complications. Transthoracic echocardiography. M-mode, complete 2D, spectral Doppler, and color Doppler. Birthdate: Patient birthdate: December 27, 1965. Age: Patient is 52 yr old. Sex: Gender: male. BMI: 44.2 kg/m^2. Blood pressure: 133/84 Patient status: Outpatient. Study date: Study date: 07/28/2017. Study time: 05:07 PM.  -------------------------------------------------------------------  ------------------------------------------------------------------- Left ventricle: Wall thickness was increased in a pattern of mild LVH. Systolic function was severely reduced. The estimated ejection fraction was in the range of 20% to 25%. Images were inadequate for LV wall motion assessment. The study is not technically sufficient to allow evaluation of LV diastolic function.  ------------------------------------------------------------------- Aortic valve: Moderately calcified annulus. Trileaflet; mildly calcified leaflets. Doppler: There was mild to moderate regurgitation - not well defined. Large, globular and  calcified density noted in LVOT just proximal to the aortic valve. 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 Due to large size this appears to obstruct flow through the LVOT which could be impacting cardiac output and therefore resulting tachycardia.  High suspicion for valvular vegetation, although could be more chronic finding due to calcification. TEE is indicated to evaluate further.  ------------------------------------------------------------------- Aorta: Aortic root: The aortic root was normal in size.  ------------------------------------------------------------------- Mitral valve: Moderately to severely calcified annulus. Doppler: There was mild regurgitation. Valve area by continuity equation (using LVOT flow): 2.45 cm^2. Indexed valve area by continuity equation (using LVOT flow): 0.87 cm^2/m^2. Mean gradient (D): 4 mm Hg. Peak gradient (D): 10 mm Hg.  ------------------------------------------------------------------- Left atrium: The atrium was moderately dilated.  ------------------------------------------------------------------- Right ventricle: Poorly visualized. The cavity size was normal. Systolic function was reduced.  ------------------------------------------------------------------- Pulmonic valve: The valve appears to be grossly normal. Doppler: There was trivial regurgitation.  ------------------------------------------------------------------- Tricuspid valve: Mildly thickened leaflets. Doppler: There was mild regurgitation.  ------------------------------------------------------------------- Right atrium: The atrium was normal in size.  ------------------------------------------------------------------- Pericardium: There was no pericardial  effusion.  ------------------------------------------------------------------- Systemic veins: Inferior vena cava: Poorly visualized.  ------------------------------------------------------------------- Measurements  Left ventricle Value Reference LV ID, ED, PLAX chordal (H) 64.9 mm 43 - 52 LV ID, ES, PLAX chordal (H) 48.5 mm 23 - 38 LV fx shortening, PLAX chordal (L) 25 % >=29 LV PW thickness, ED 10.7 mm --------- IVS/LV PW ratio, ED 1 <=1.3 Stroke volume, 2D 70 ml --------- Stroke volume/bsa, 2D 25 ml/m^2 --------- Longitudinal strain, TDI 6 % ---------  Ventricular septum Value Reference IVS thickness, ED 10.7 mm ---------  LVOT Value Reference LVOT ID, S 21 mm --------- LVOT area 3.46 cm^2 --------- LVOT peak velocity, S 103 cm/s --------- LVOT mean velocity, S 81.3 cm/s --------- LVOT VTI, S 20.3 cm ---------  Aortic valve Value Reference Aortic regurg pressure half-time 277 ms ---------  Aorta Value Reference Aortic root ID, ED 36 mm ---------  Left atrium Value Reference LA ID, A-P, ES 47 mm --------- LA ID/bsa, A-P 1.67 cm/m^2 <=2.2 LA volume, S 151 ml  --------- LA volume/bsa, S 53.7 ml/m^2 --------- LA volume, ES, 1-p A4C 124 ml --------- LA volume/bsa, ES, 1-p A4C 44.1 ml/m^2 --------- LA volume, ES, 1-p A2C 157 ml --------- LA volume/bsa, ES, 1-p A2C 55.9 ml/m^2 ---------  Mitral valve Value Reference Mitral E-wave peak velocity 159 cm/s --------- Mitral A-wave peak velocity 57 cm/s --------- Mitral mean velocity, D 89 cm/s --------- Mitral deceleration time (L) 87 ms 150 - 230 Mitral mean gradient, D 4 mm Hg --------- Mitral peak gradient, D 10 mm Hg --------- Mitral E/A ratio, peak 2.8 --------- Mitral valve area, LVOT continuity 2.45 cm^2 --------- Mitral valve area/bsa, LVOT 0.87 cm^2/m^2 --------- continuity Mitral annulus VTI, D 28.7 cm --------- Mitral regurg VTI, PISA 123 cm --------- Mitral ERO, PISA 0.24 cm^2 --------- Mitral regurg volume, PISA 30 ml ---------  Tricuspid valve Value Reference Tricuspid peak RV-RA gradient 23 mm Hg ---------  Right ventricle Value Reference TAPSE 27.2 mm ---------  Legend: (L) and (H) mark values outside specified reference range.  ------------------------------------------------------------------- Prepared and Electronically Authenticated by  Rozann Lesches, M.D. 2019-04-12T16:35:28   Transesophageal Echocardiography  Patient: [name] [name], Anschutz MR #:  [id number] Study Date: 07/31/2017 Gender: M Age: 29 Height: 182.9 cm Weight: 148.5 kg BSA: 2.82 m^2 Pt. Status: Room:  PERFORMING Jenkins Rouge, M.D. [address] [place] [id number], [place] [id number] [place] [id number] ADMITTING Vianne Bulls SONOGRAPHER Cardell Peach, RDCS  cc:  ------------------------------------------------------------------- LV EF: 25% - 30%  ------------------------------------------------------------------- History: PMH: Aortic Valve mass  Syncope. Congestive heart failure. Risk factors: Hypertension.  ------------------------------------------------------------------- Study Conclusions  - Left ventricle: Hypertrophy was noted. Systolic function  was severely reduced. The estimated ejection fraction was in the range of 25% to 30%. Diffuse hypokinesis. - Aortic valve: Valve is trileaflet and severely thickened. Did not think there was a mass or vegetation on valve Nodular calcification of leaflets wtith bulky calcium. There was significatn shadowing of the mid esohageal views obscuring the LVOT but had good images from trans gastic imaging There was mild stenosis. There was severe regurgitation. - Mitral valve: Moderately to severely calcified annulus. Moderately thickened, moderately calcified leaflets . There was moderate regurgitation. - Left atrium: The atrium was dilated. No evidence of thrombus in the atrial cavity or appendage. - Right ventricle: The cavity size was mildly dilated. - Right atrium: No evidence of thrombus in the atrial cavity or appendage. - Atrial septum: No defect or patent foramen ovale was identified. - Impressions: 3D rendering of the Aortic and mitral valves was performed. Note patient was intubated for procedure by Dr Glennon Mac Anesthesia due to low BP, coughing and need for propopofol for  sedation.  Impressions:  - 3D rendering of the Aortic and mitral valves was performed. Note patient was intubated for procedure by Dr Glennon Mac Anesthesia due to low BP, coughing and need for propopofol for sedation.  ------------------------------------------------------------------- Study data: Study status: Routine. Consent: The risks, benefits, and alternatives to the procedure were explained to the patient and informed consent was obtained. Procedure: Initial setup. The patient was brought to the laboratory. Surface ECG leads were monitored. Sedation. Conscious sedation was administered by anesthesiology staff. Transesophageal echocardiography. Topical anesthesia was obtained using viscous lidocaine. A transesophageal probe was inserted by the attending cardiologist. Image quality was adequate. Study completion: The patient tolerated the procedure well. There were no complications. Diagnostic transesophageal echocardiography. 2D and color Doppler. Birthdate: Patient birthdate: 1966-02-27. Age: Patient is 52 yr old. Sex: Gender: male. BMI: 44.4 kg/m^2. Blood pressure: 109/68 Patient status: Inpatient. Study date: Study date: 07/31/2017. Study time: 12:13 PM. Location: Endoscopy.  -------------------------------------------------------------------  ------------------------------------------------------------------- Left ventricle: Hypertrophy was noted. Systolic function was severely reduced. The estimated ejection fraction was in the range of 25% to 30%. Diffuse hypokinesis.  ------------------------------------------------------------------- Aortic valve: Valve is trileaflet and severely thickened. Did not think there was a mass or vegetation on valve Nodular calcification of leaflets wtith bulky calcium. There was significatn shadowing of the mid esohageal views obscuring the LVOT but had good images from trans gastic imaging  Trileaflet; severely thickened, severely calcified leaflets. Doppler: There was mild stenosis. There was severe regurgitation. VTI ratio of LVOT to aortic valve: 0.37. Peak velocity ratio of LVOT to aortic valve: 0.4. Mean velocity ratio of LVOT to aortic valve: 0.4. Mean gradient (S): 3 mm Hg. Peak gradient (S): 5 mm Hg.  ------------------------------------------------------------------- Mitral valve: Moderately to severely calcified annulus. Moderately thickened, moderately calcified leaflets . Doppler: There was moderate regurgitation.  ------------------------------------------------------------------- Left atrium: The atrium was dilated. No evidence of thrombus in the atrial cavity or appendage.  ------------------------------------------------------------------- Atrial septum: No defect or patent foramen ovale was identified.  ------------------------------------------------------------------- Right ventricle: The cavity size was mildly dilated.  ------------------------------------------------------------------- Pulmonic valve: Doppler: There was mild regurgitation.  ------------------------------------------------------------------- Tricuspid valve: Doppler: There was mild regurgitation.  ------------------------------------------------------------------- Right atrium: The atrium was normal in size. No evidence of thrombus in the atrial cavity or appendage.  ------------------------------------------------------------------- Pericardium: The pericardium was normal in appearance. There was no pericardial effusion.  ------------------------------------------------------------------- Measurements  LVOT Value LVOT peak velocity, S 47 cm/s LVOT mean velocity, S 32.9 cm/s LVOT VTI, S 7.97 cm LVOT peak gradient,  S 1 mm  Hg  Aortic valve Value Aortic valve peak velocity, S 117 cm/s Aortic valve mean velocity, S 81.9 cm/s Aortic valve VTI, S 21.8 cm Aortic mean gradient, S 3 mm Hg Aortic peak gradient, S 5 mm Hg VTI ratio, LVOT/AV 0.37 Velocity ratio, peak, LVOT/AV 0.4 Velocity ratio, mean, LVOT/AV 0.4 Aortic regurg pressure half-time 255 ms  Legend: (L) and (H) mark values outside specified reference range.  ------------------------------------------------------------------- Prepared and Electronically Authenticated by  Jenkins Rouge, M.D. 2019-04-15T13:30:36   RIGHT/LEFT HEART CATH AND CORONARY ANGIOGRAPHY  Conclusion   1. Severe ostial/proximal LCx stenosis.  Will need bypass of this vessel when he has cardiac surgery.  2. Filling pressures remain elevated despite HD today.  Would repeat HD tomorrow.  3. Preserved cardiac output.  4. LV-gram and aortic root shot not done with LVEDP elevated to 32 mmHg.   Of note, patient arrived to cath lab in SVT rate in 150s.  He did not break with adenosine but went back into NSR after Lopressor 5 mg IV bolus.   Procedural Details/Technique   Technical Details Procedure: Right Heart Cath, Left Heart Cath, Selective Coronary Angiography, LV angiography  Indication: Cardiomyopathy of uncertain etiology.   Procedural Details: The right groin was prepped, draped, and anesthetized with 1% lidocaine. Using the modified Seldinger technique a 5 French sheath was placed in the right femoral artery and a 7 French sheath was placed in the right femoral vein. A Swan-Ganz catheter was used for the right heart catheterization. Standard protocol was followed for recording of right heart pressures and sampling of oxygen saturations. Fick and thermodilution cardiac output was calculated. EBU4 and JR4 were used for selective  coronary angiography. LV-gram and aortic root shot not done due to high LVEDP. There were no immediate procedural complications. The patient was transferred to the post catheterization recovery area for further monitoring.   Estimated blood loss <50 mL.  During this procedure the patient was administered the following to achieve and maintain moderate conscious sedation: Versed 1 mg, Fentanyl 50 mcg, while the patient's heart rate, blood pressure, and oxygen saturation were continuously monitored. The period of conscious sedation was 60 minutes, of which I was present face-to-face 100% of this time.  Coronary Findings   Diagnostic  Dominance: Right  Left Main  No significant disease.  Left Anterior Descending  40% mid LAD stenosis. Small D2 with 95% ostial stenosis. Small D3 with 70% mid vessel stenosis.  Left Circumflex  Serial 80% ostial and 80% proximal LCx. Large vessel.  Right Coronary Artery  Luminal irregularities.  Intervention   No interventions have been documented.  Right Heart   Right Heart Pressures [facility] Procedural Findings: Hemodynamics (mmHg) RA mean 11 RV 51/14 PA 53/24, mean 37 PCWP mean 26 LV 105/32 AO 97/65  Oxygen saturations: PA 61% AO 91%  Cardiac Output (Fick) 7  Cardiac Index (Fick) 2.67  PVR 1.6 WU  Cardiac Output (Thermo) 6.4 Cardiac Index (Thermo) 2.44  Wall Motion   Not done, high LVEDP.         Implants    No implant documentation for this case.  MERGE Images   Show images for CARDIAC CATHETERIZATION   Link to Procedure Log   Procedure Log    Hemo Data    Most Recent Value  Fick Cardiac Output 7 L/min  Fick Cardiac Output Index 2.67 (L/min)/BSA  Thermal Cardiac Output 6.4 L/min  Thermal Cardiac Output Index 2.44 (L/min)/BSA  RA A Wave 15 mmHg  RA V Wave 12 mmHg  RA Mean 11 mmHg  RV Systolic Pressure 51 mmHg  RV Diastolic Pressure 7 mmHg  RV EDP 14 mmHg  PA Systolic Pressure 53 mmHg  PA Diastolic Pressure 24 mmHg  PA  Mean 37 mmHg  PW A Wave 28 mmHg  PW V Wave 26 mmHg  PW Mean 26 mmHg  AO Systolic Pressure 97 mmHg  AO Diastolic Pressure 65 mmHg  AO Mean 80 mmHg  LV Systolic Pressure 875 mmHg  LV Diastolic Pressure 12 mmHg  LV EDP 32 mmHg  Arterial Occlusion Pressure Extended Systolic Pressure 643 mmHg  Arterial Occlusion Pressure Extended Diastolic Pressure 64 mmHg  Arterial Occlusion Pressure Extended Mean Pressure 80 mmHg  Left Ventricular Apex Extended Systolic Pressure 329 mmHg  Left Ventricular Apex Extended Diastolic Pressure 9 mmHg  Left Ventricular Apex Extended EDP Pressure 32 mmHg  TPVR Index 15.15 HRUI  TSVR Index 32.75 HRUI  PVR SVR Ratio 0.16  TPVR/TSVR Ratio 0.46     Impression:  Patient has stage D severe symptomatic primary aortic insufficiency, moderate mitral regurgitation, multivessel coronary artery disease including high-grade proximal stenosis of the left circumflex coronary artery with severe left ventricular systolic dysfunction, and long-standing history of hypertension with diastolic dysfunction complicated by dialysis dependent end-stage renal disease.  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  I have personally reviewed the patient's recent transthoracic and transesophageal echocardiograms.  The aortic valve is trileaflet.  There is severe aortic insufficiency with an eccentric jet suggestive of possible leaflet prolapse and/or perforation.  There is very mild thickening of the leaflets but nothing that appears pathognomonic for underlying rheumatic disease.  There is no clear evidence of vegetation or leaflet perforation, but it is possible the patient's valvular disease could be related to previous history of bacterial endocarditis.  The patient has moderate mitral regurgitation with what appears to be primarily  normal leaflet mobility.  This may be purely secondary MR although there is some sclerosis of the posterior leaflet, posterior mitral annular calcification, and the possibility of significant leaflet restriction.  I feel the patient would best be treated aortic replacement and coronary artery bypass grafting.  It is possible that he might need concomitant mitral valve repair as well.    Risks associated with surgery will be fairly high because of the patient's degree of left ventricular systolic dysfunction and his numerous comorbid medical problems, most notably dialysis dependent renal failure.   Plan:  The patient and his wife were again counseled at length regarding treatment alternatives for management of severe aortic insufficiency including continued medical therapy versus proceeding with aortic valve replacement in the near future.  The natural history of aortic insufficiency was reviewed, as was long term prognosis with medical therapy alone.  The need for coronary artery bypass grafting was discussed as well as the possibility of need for concomitant mitral valve repair or replacement.  Surgical options were discussed at length including a comparison the relative risks of mechanical valve replacement with need for lifelong anticoagulation versus use of a bioprosthetic tissue valve and the associated potential for late structural valve deterioration and failure.  This discussion was placed in the context of the patient's particular circumstances durability of bioprosthetic tissue valve in the setting of end-stage renal disease.including the potential for shortened.  As a result the patient specifically requests that his valve be replaced  using a mechanical valve.  The patient understands and accepts all potential associated risks of surgery including but not limited to risk of death, stroke, myocardial infarction, congestive heart failure, respiratory failure, renal failure, pneumonia, bleeding  requiring blood transfusion and or reexploration, arrhythmia, heart block or bradycardia requiring permanent pacemaker, aortic dissection or other major vascular complication, pleural effusions or other delayed complications related to continued congestive heart failure, other late complications related to valve replacement including structural valve deterioration and failure, thrombosis, endocarditis, or paravalvular leak, chronic pain or other delayed complications related to median sternotomy, or the late recurrence of symptomatic ischemic heart disease and/or congestive heart failure.  All questions answered.  We plan to proceed with surgery on Friday, Sep 01, 2017.  The patient will continue all current medications through the evening prior to surgery.  He will not take any medications on the morning of surgery.  I spent in excess of 30 minutes during the conduct of this office consultation and >50% of this time involved direct face-to-face encounter with the patient for counseling and/or coordination of their care.    Valentina Gu. Roxy Manns, MD 08/21/2017 4:24 PM

## 2017-08-21 NOTE — Progress Notes (Signed)
[address]       [place], [address]             [facility] REPORT  Referring Provider is Larey Dresser, MD  Primary Cardiologist is Herminio Commons, MD Primary Nephrologist is Fran Lowes, MD PCP is System, Provider Not In  Chief Complaint  Patient presents with  . 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    The patient was originally seen in consultation during his recent hospitalization for acute exacerbation of chronic combined systolic and diastolic congestive heart failure.  He has long-standing history of severe hypertension and approximately 20 years ago he began to develop symptoms of chronic diastolic congestive heart failure in the setting of progressive renal failure.  He ultimately became dialysis dependent approximately 17 years ago.  An echocardiogram performed 12 years ago revealed normal left ventricular systolic function with no mention of any significant valvular disease.  Recently the patient developed persistent dry nonproductive cough and orthopnea.  He was referred for cardiology consultation and evaluated by Dr. Bronson Ing 2 weeks ago.  Transthoracic echocardiogram performed April 29, 2017 revealed severe left ventricular systolic dysfunction with ejection fraction estimated 20-25%.  There was at least mild to moderate aortic insufficiency and question of possible vegetation beneath the aortic valve in the left ventricular outflow tract.  There was mild mitral regurgitation.  The patient was contacted via telephone  and brought in for hospital admission for further workup and therapy.  Multiple sets of blood cultures were obtained and remain no growth to date.  Transesophageal echocardiogram performed July 31, 2017 revealed severe aortic insufficiency with no clear evidence of vegetation.  There was moderate mitral regurgitation.  There was severe left ventricular systolic dysfunction with ejection fraction estimated 25-30%.  There was significant left ventricular hypertrophy.  Left atrium was dilated.  Cardiothoracic surgical consultation was requested.  The patient subsequently underwent left and right heart catheterization on August 02, 2017.  He was found to have multivessel coronary artery disease including high-grade proximal stenosis of the left circumflex coronary artery.  There was moderate pulmonary hypertension with PA pressures measured 53/24 with mean pulmonary capillary wedge pressure 26 mmHg and central venous pressure 11 mmHg.  There were no large V waves seen on wedge tracing.  Forward cardiac output was preserved with mixed venous oxygen saturation 61% at baseline.  The patient subsequently was seen in consultation by the dental service and underwent dental extraction.  He was eventually discharged home and returns the office today with tentative plans to proceed with elective surgical intervention next week.  Patient is married and lives with his wife in [place].  He has been disabled since he went on hemodialysis approximately 17 years ago.  He still drives an automobile and remains reasonably functional.  He has been morbidly obese for all of his adult life.  He states that he ambulates without significant difficulty.  He denies any symptoms of shortness of breath.  He has not been having problems with dialysis treatments recently.  He denies any history of fevers, chills, or night sweats.  Appetite  is stable.  He complains of a dry persistent cough that is made worse laying flat in bed.  He has had  episodes of PND.  He denies palpitations, dizzy spells, or syncope.  He has not had lower extremity edema.  He dialyzes on a Tuesday, Thursday, Saturday schedule at the [facility] dialysis center via a left forearm primary AV fistula.  He has not had any problems with dialysis access for the past 2 years.  Prior to his recent hospitalization the patient had been anticoagulated chronically using Eliquis because of previous history of clotting of dialysis grafts.  Eliquis was not resumed at the time of hospital discharge.  The patient reports no new problems or complaints since hospital discharge.  He states that his breathing has been stable.  Dialysis treatments have been uneventful.  He has not had any fevers or chills.  Final report of blood cultures obtained during his recent hospitalization were no growth.    Past Medical History:  Diagnosis Date  . Aneurysm ([place])     Right arm fistula 3 aneurysms 2011,   plans to have a new procedure  . Angina   . Chest discomfort   . CHF (congestive heart failure) ([place])   . Coronary artery disease   . Dialysis patient ([place])   . Dysrhythmia   . ESRD (end stage renal disease) ([place])    on hemodialysis T_T_S  . Hypertension   . Leg pain   . Mitral regurgitation   . Morbid obesity ([place])   . Orthostatic hypotension   . Overweight(278.02)   . Severe aortic insufficiency   . Sinus tachycardia   . Syncope     positional after dialysis... 2008    Past Surgical History:  Procedure Laterality Date  . A/V FISTULAGRAM Left 06/30/2016   Procedure: A/V Fistulagram;  Surgeon: Algernon Huxley, MD;  Location: [place] [facility];  Service: Cardiovascular;  Laterality: Left;  . ARTERIOVENOUS GRAFT PLACEMENT    . AV FISTULA PLACEMENT    . AV FISTULA PLACEMENT Left 11/18/2015   Procedure: ARTERIOVENOUS (AV) FISTULA CREATION ( RADIOCEPHALIC );  Surgeon: Algernon Huxley, MD;  Location: [facility];  Service: Vascular;  Laterality: Left;  . AV FISTULA PLACEMENT Left 03/23/2016    Procedure: ARTERIOVENOUS (AV) FISTULA CREATION ( REVISION );  Surgeon: Algernon Huxley, MD;  Location: [facility];  Service: Vascular;  Laterality: Left;  . [place] REMOVAL Left 03/23/2016   Procedure: REMOVAL OF ARTERIOVENOUS GORETEX GRAFT ([place]);  Surgeon: Algernon Huxley, MD;  Location: [facility];  Service: Vascular;  Laterality: Left;  . DIALYSIS/PERMA CATHETER REMOVAL N/A 08/01/2016   Procedure: Dialysis/Perma Catheter Removal;  Surgeon: Algernon Huxley, MD;  Location: [place] [facility];  Service: Cardiovascular;  Laterality: N/A;  . HERNIA REPAIR    . INSERTION OF DIALYSIS CATHETER  03/01/2011   Procedure: INSERTION OF DIALYSIS CATHETER;  Surgeon: Elam Dutch, MD;  Location: [place];  Service: Vascular;  Laterality: Left;  Exchange of Dialysis Catheter to 27cm 15Fr. Arrow Catheter  . MULTIPLE EXTRACTIONS WITH ALVEOLOPLASTY N/A 08/07/2017   Procedure: Extraction of tooth #2, 7,8,13,14,15,17, and 31 with alveoloplasty and gross debridement of remaining teeth;  Surgeon: [name], DDS;  Location: [place];  Service: Oral Surgery;  Laterality: N/A;  . PERIPHERAL VASCULAR CATHETERIZATION N/A 09/01/2014   Procedure: A/V Shuntogram/Fistulagram;  Surgeon: Algernon Huxley, MD;  Location: [place] [facility];  Service: Cardiovascular;  Laterality: N/A;  . PERIPHERAL VASCULAR CATHETERIZATION Right 09/01/2014  Procedure: Thrombectomy;  Surgeon: Algernon Huxley, MD;  Location: [place] [facility];  Service: Cardiovascular;  Laterality: Right;  . PERIPHERAL VASCULAR CATHETERIZATION Right 09/01/2014   Procedure: A/V Shunt Intervention;  Surgeon: Algernon Huxley, MD;  Location: [place] [facility];  Service: Cardiovascular;  Laterality: Right;  . PERIPHERAL VASCULAR CATHETERIZATION N/A 09/17/2014   Procedure: A/V Shuntogram/Fistulagram;  Surgeon: Algernon Huxley, MD;  Location: [place] [facility];  Service: Cardiovascular;  Laterality: N/A;  . PERIPHERAL VASCULAR CATHETERIZATION Right 10/17/2014   Procedure: Thrombectomy;  Surgeon:  Katha Cabal, MD;  Location: [place] [facility];  Service: Cardiovascular;  Laterality: Right;  . PERIPHERAL VASCULAR CATHETERIZATION N/A 10/17/2014   Procedure: A/V Shuntogram/Fistulagram;  Surgeon: Katha Cabal, MD;  Location: [place] [facility];  Service: Cardiovascular;  Laterality: N/A;  . PERIPHERAL VASCULAR CATHETERIZATION N/A 10/17/2014   Procedure: A/V Shunt Intervention;  Surgeon: Katha Cabal, MD;  Location: [place] [facility];  Service: Cardiovascular;  Laterality: N/A;  . PERIPHERAL VASCULAR CATHETERIZATION Right 11/04/2014   Procedure: Thrombectomy;  Surgeon: Katha Cabal, MD;  Location: [place] [facility];  Service: Cardiovascular;  Laterality: Right;  . PERIPHERAL VASCULAR CATHETERIZATION Left 11/04/2014   Procedure: Visceral Venography;  Surgeon: Katha Cabal, MD;  Location: [place] [facility];  Service: Cardiovascular;  Laterality: Left;  . PERIPHERAL VASCULAR CATHETERIZATION Right 11/27/2014   Procedure: A/V Shuntogram/Fistulagram;  Surgeon: Algernon Huxley, MD;  Location: [place] [facility];  Service: Cardiovascular;  Laterality: Right;  . PERIPHERAL VASCULAR CATHETERIZATION Right 11/27/2014   Procedure: A/V Shunt Intervention;  Surgeon: Algernon Huxley, MD;  Location: [place] [facility];  Service: Cardiovascular;  Laterality: Right;  . PERIPHERAL VASCULAR CATHETERIZATION Right 12/31/2014   Procedure: Thrombectomy;  Surgeon: Algernon Huxley, MD;  Location: [place] [facility];  Service: Cardiovascular;  Laterality: Right;  . PERIPHERAL VASCULAR CATHETERIZATION Right 01/12/2015   Procedure: A/V Shuntogram/Fistulagram;  Surgeon: Algernon Huxley, MD;  Location: [place] [facility];  Service: Cardiovascular;  Laterality: Right;  . PERIPHERAL VASCULAR CATHETERIZATION N/A 01/12/2015   Procedure: A/V Shunt Intervention;  Surgeon: Algernon Huxley, MD;  Location: [place] [facility];  Service: Cardiovascular;  Laterality: N/A;  . PERIPHERAL VASCULAR CATHETERIZATION Right  01/28/2015   Procedure: Thrombectomy;  Surgeon: Algernon Huxley, MD;  Location: [place] [facility];  Service: Cardiovascular;  Laterality: Right;  . PERIPHERAL VASCULAR CATHETERIZATION N/A 06/08/2015   Procedure: A/V Shuntogram/Fistulagram;  Surgeon: Algernon Huxley, MD;  Location: [place] [facility];  Service: Cardiovascular;  Laterality: N/A;  . PERIPHERAL VASCULAR CATHETERIZATION N/A 06/08/2015   Procedure: A/V Shunt Intervention;  Surgeon: Algernon Huxley, MD;  Location: [place] [facility];  Service: Cardiovascular;  Laterality: N/A;  . PERIPHERAL VASCULAR CATHETERIZATION Right 07/06/2015   Procedure: A/V Shuntogram/Fistulagram;  Surgeon: Algernon Huxley, MD;  Location: [place] [facility];  Service: Cardiovascular;  Laterality: Right;  . PERIPHERAL VASCULAR CATHETERIZATION N/A 07/06/2015   Procedure: A/V Shunt Intervention;  Surgeon: Algernon Huxley, MD;  Location: [place] [facility];  Service: Cardiovascular;  Laterality: N/A;  . PERIPHERAL VASCULAR CATHETERIZATION N/A 08/04/2015   Procedure: graft declot;  Surgeon: Katha Cabal, MD;  Location: [place] [facility];  Service: Cardiovascular;  Laterality: N/A;  . PERIPHERAL VASCULAR CATHETERIZATION Right 08/25/2015   Procedure: A/V Shuntogram/Fistulagram;  Surgeon: Katha Cabal, MD;  Location: [place] [facility];  Service: Cardiovascular;  Laterality: Right;  . PERIPHERAL VASCULAR CATHETERIZATION N/A  11/04/2015   Procedure: Dialysis/Perma Catheter Insertion;  Surgeon: Algernon Huxley, MD;  Location: [place] [facility];  Service: Cardiovascular;  Laterality: N/A;  . PERIPHERAL VASCULAR CATHETERIZATION Left 12/14/2015   Procedure: A/V Shuntogram/Fistulagram;  Surgeon: Algernon Huxley, MD;  Location: [place] [facility];  Service: Cardiovascular;  Laterality: Left;  . PERIPHERAL VASCULAR CATHETERIZATION N/A 12/14/2015   Procedure: A/V Shunt Intervention;  Surgeon: Algernon Huxley, MD;  Location: [place] [facility];  Service: Cardiovascular;  Laterality: N/A;    . PERIPHERAL VASCULAR CATHETERIZATION N/A 12/17/2015   Procedure: Dialysis/Perma Catheter Insertion;  Surgeon: Algernon Huxley, MD;  Location: [place] [facility];  Service: Cardiovascular;  Laterality: N/A;  . PERIPHERAL VASCULAR CATHETERIZATION Left 12/22/2015   Procedure: Dialysis/Perma Catheter Insertion;  Surgeon: Katha Cabal, MD;  Location: [place] [facility];  Service: Cardiovascular;  Laterality: Left;  . PERIPHERAL VASCULAR CATHETERIZATION Left 02/29/2016   Procedure: A/V Shuntogram/Fistulagram;  Surgeon: Algernon Huxley, MD;  Location: [place] [facility];  Service: Cardiovascular;  Laterality: Left;  . PERIPHERAL VASCULAR CATHETERIZATION N/A 02/29/2016   Procedure: A/V Shunt Intervention;  Surgeon: Algernon Huxley, MD;  Location: [place] [facility];  Service: Cardiovascular;  Laterality: N/A;  . right arm graft     for dyalisis  . RIGHT/LEFT HEART CATH AND CORONARY ANGIOGRAPHY N/A 08/02/2017   Procedure: RIGHT/LEFT HEART CATH AND CORONARY ANGIOGRAPHY;  Surgeon: Larey Dresser, MD;  Location: [place] [facility];  Service: Cardiovascular;  Laterality: N/A;  . TEE WITHOUT CARDIOVERSION N/A 07/31/2017   Procedure: TRANSESOPHAGEAL ECHOCARDIOGRAM (TEE);  Surgeon: Josue Hector, MD;  Location[facility];  Service: Cardiovascular;  Laterality: N/A;  . THROMBECTOMY    . THROMBECTOMY W/ EMBOLECTOMY  03/01/2011   Procedure: THROMBECTOMY ARTERIOVENOUS GORE-TEX GRAFT;  Surgeon: Elam Dutch, MD;  Location: [place];  Service: Vascular;  Laterality: Right;  Attempted Thrombectomy of Old  Right Upper Arm Arteriovenous gortex Graft. Insertion of new Arteriovenous Graft using 88mm x 50cm Gortex Stretch graft.   . THROMBECTOMY W/ EMBOLECTOMY  07/11/2011   Procedure: THROMBECTOMY ARTERIOVENOUS GORE-TEX GRAFT;  Surgeon: Rosetta Posner, MD;  Location: [place];  Service: Vascular;  Laterality: Right;  . UMBILICAL HERNIA REPAIR    . VENOGRAM N/A 08/23/2011   Procedure: VENOGRAM;  Surgeon: [name], MD;   Location: [facility] C[facility] LAB;  Service: Cardiovascular;  Laterality: N/A;    Family History  Problem Relation Age of Onset  . Hypertension Mother   . Heart disease Mother   . Hypertension Father   . Heart disease Father     Social History   Socioeconomic History  . Marital status: Married    Spouse name: Not on file  . Number of children: Not on file  . Years of education: Not on file  . Highest education level: Not on file  Occupational History  . Occupation: disabled  Social Needs  . Financial resource strain: Not on file  . Food insecurity:    Worry: Not on file    Inability: Not on file  . Transportation needs:    Medical: Not on file    Non-medical: Not on file  Tobacco Use  . Smoking status: Former Smoker    Types: Cigarettes    Last attempt to quit: 04/18/1994    Years since quitting: 23.3  . Smokeless tobacco: Current User    Types: Snuff  . Tobacco comment: dips 1/2 snuff per day times 25 years  Substance and  Sexual Activity  . Alcohol use: No  . Drug use: No  . Sexual activity: Not on file  Lifestyle  . Physical activity:    Days per week: Not on file    Minutes per session: Not on file  . Stress: Not on file  Relationships  . Social connections:    Talks on phone: Not on file    Gets together: Not on file    Attends religious service: Not on file    Active member of club or organization: Not on file    Attends meetings of clubs or organizations: Not on file    Relationship status: Not on file  . Intimate partner violence:    Fear of current or ex partner: Not on file    Emotionally abused: Not on file    Physically abused: Not on file    Forced sexual activity: Not on file  Other Topics Concern  . Not on file  Social History Narrative  . Not on file    Current Outpatient Medications  Medication Sig Dispense Refill  . atorvastatin (LIPITOR) 40 MG tablet Take 1 tablet (40 mg total) by mouth daily at 6 PM. 30 tablet 0  . B Complex-C-Folic Acid  (NEPHRO-VITE PO) Take 1 tablet by mouth daily.     . benzonatate (TESSALON) 100 MG capsule 2 po tid for cough 30 capsule 0  . chlorhexidine (PERIDEX) 0.12 % solution Rinse with 15 mls twice daily for 30 seconds. Use after breakfast and at bedtime. Spit out excess. Do not swallow. (Patient taking differently: Use as directed 15 mLs in the mouth or throat 4 (four) times daily. For 30 seconds. Use after breakfast and at bedtime. Spit out excess. Do not swallow.) 960 mL prn  . FOSRENOL 500 MG chewable tablet Chew 1,500-2,000 mg by mouth See admin instructions. Take 2000 mg by mouth with meals and take 1500 mg by mouth with snacks    . ivabradine (CORLANOR) 5 MG TABS tablet Take 1 tablet (5 mg total) by mouth 2 (two) times daily with a meal. (Patient taking differently: Take 5 mg by mouth daily. ) 60 tablet 3  . metoprolol succinate (TOPROL-XL) 50 MG 24 hr tablet Take 1 tablet (50 mg total) by mouth daily. 30 tablet 6  . midodrine (PROAMATINE) 5 MG tablet Take 5 mg by mouth See admin instructions. Take 5 mg by mouth daily on Tuesday, Thursday and Saturday.  4  . nitroGLYCERIN (NITROSTAT) 0.4 MG SL tablet Place 1 tablet (0.4 mg total) under the tongue every 5 (five) minutes as needed. For chest (Patient taking differently: Place 0.4 mg under the tongue every 5 (five) minutes as needed for chest pain. ) 45 tablet 3  . oxyCODONE-acetaminophen (PERCOCET) 5-325 MG tablet Take one or two tablets by mouth every 6 hours as needed for pain. (Patient taking differently: Take 1 tablet by mouth every 4 (four) hours as needed for severe pain. ) 24 tablet 0   No current facility-administered medications for this visit.     No Known Allergies    Review of Systems:              General:                      normal appetite, decreased energy, no weight gain, no weight loss, no fever             Cardiac:  no chest pain with exertion, no chest pain at rest, +SOB with exertion, no resting SOB, + PND,  + orthopnea, no palpitations, no arrhythmia, no atrial fibrillation, no LE edema, no dizzy spells, no syncope             Respiratory:                 no shortness of breath, no home oxygen, no productive cough, + persistent dry cough, no bronchitis, no wheezing, no hemoptysis, no asthma, no pain with inspiration or cough, possible sleep apnea, no CPAP at night             GI:                               no difficulty swallowing, no reflux, no frequent heartburn, no hiatal hernia, no abdominal pain, no constipation, no diarrhea, no hematochezia, no hematemesis, no melena             GU:                              Minimal UOP, no dysuria,  no frequency, no urinary tract infection, no hematuria, no enlarged prostate, no kidney stones, + kidney disease             Vascular:                     no pain suggestive of claudication, no pain in feet, no leg cramps, + varicose veins, no DVT, no non-healing foot ulcer             Neuro:                         no stroke, no TIA's, no seizures, no headaches, no temporary blindness one eye,  no slurred speech, no peripheral neuropathy, no chronic pain, mild instability of gait, no memory/cognitive dysfunction             Musculoskeletal:         no arthritis , no joint swelling, no myalgias, no difficulty walking, normal mobility              Skin:                            no rash, no itching, no skin infections, no pressure sores or ulcerations             Psych:                         no anxiety, no depression, no nervousness, no unusual recent stress             Eyes:                           no blurry vision, no floaters, no recent vision changes, does not wear glasses or contacts             ENT:                            no hearing loss, no loose or painful teeth, no dentures, last saw dentist > 20 years ago  Hematologic:               no easy bruising, no abnormal bleeding, no clotting disorder, no frequent epistaxis             Endocrine:                    no diabetes, does not check CBG's at home     Physical Exam:   BP 102/60 (BP Location: Right Arm, Patient Position: Sitting, Cuff Size: Large)   Pulse 95   Resp 18   Ht 6' (1.829 m)   Wt (!) 320 lb 6.4 oz (145.3 kg)   SpO2 96% Comment: RA  BMI 43.45 kg/m              General:                      Morbidly obese male NAD               HEENT:                       Unremarkable              Neck:                           no JVD, no bruits, no adenopathy              Chest:                          clear to auscultation, symmetrical breath sounds, no wheezes, no rhonchi              CV:                              RRR, grade III/VI diastolic murmur best RSB             Abdomen:                    soft, non-tender, no masses              Extremities:                 warm, well-perfused, pulses not palpable, no lower extremity edema             Rectal/GU                   Deferred             Neuro:                         Grossly non-focal and symmetrical throughout             Skin:                            Clean and dry, no rashes, no breakdown    Diagnostic Tests:  Transthoracic Echocardiography  Patient: [place], [place] MR #: [id number] Study Date: 07/28/2017 Gender: M Age: 22 Height: 182.9 cm Weight: 147.9 kg BSA: 2.81 m^2 Pt. Status: Room:  SONOGRAPHER [facility], [facility], [place], MD [place], MD [place], MD PERFORMING [facility], [place]  cc:  ------------------------------------------------------------------- LV  EF: 20% - 25%  ------------------------------------------------------------------- History: PMH: Tachycardia and syncope. Congestive heart failure. Risk factors: Former tobacco use.  ------------------------------------------------------------------- Study Conclusions  - Procedure narrative: Transthoracic  echocardiography. Image quality was adequate. The study was technically difficult, as a result of poor acoustic windows, poor sound wave transmission, and body habitus. - Left ventricle: Wall thickness was increased in a pattern of mild LVH. Systolic function was severely reduced. The estimated ejection fraction was in the range of 20% to 25%. The study is not technically sufficient to allow evaluation of LV diastolic function. - Aortic valve: Moderately calcified annulus. Trileaflet; mildly calcified leaflets. There was mild to moderate regurgitation - not well defined. Large, globular and calcified density noted in LVOT just proximal to the aortic valve. 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 Due to large size this appears to obstruct flow through the LVOT which could be impacting cardiac output and therefore resulting tachycardia. High suspicion for valvular vegetation, although could be more chronic finding due to calcification. TEE is indicated to evaluate further. - Mitral valve: Moderately to severely calcified annulus. There was mild regurgitation. Valve area by continuity equation (using LVOT flow): 2.45 cm^2. - Left atrium: The atrium was moderately dilated. - Right ventricle: Poorly visualized. Systolic function was reduced. - Tricuspid valve: Mildly thickened leaflets. There was mild regurgitation. Peak RV-RA gradient (S): 23 mm Hg. - Pericardium, extracardiac: There was no pericardial effusion.  Impressions:  - Large, globular and calcified density noted in LVOT just proximal to the aortic valve. 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 Due to large size this appears to obstruct flow  through the LVOT which could be impacting cardiac output and therefore resulting tachycardia. High suspicion for valvular vegetation, although could be more chronic finding due to calcification. TEE is indicated to evaluate further. Ordering provider out of the office. Patient being contacted by nursing and directed to ER at [facility] in anticipation of admission for further evaluation of both cardiomyopathy and potential endocarditis.  ------------------------------------------------------------------- Study data: Comparison was made to the study of July 2012. LVEF 60%, no RWMA. Study status: Routine. Procedure: Transthoracic echocardiography. Image quality was adequate. The study was technically difficult, as a result of poor acoustic windows, poor sound wave transmission, and body habitus. Study completion: There were no complications. Transthoracic echocardiography. M-mode, complete 2D, spectral Doppler, and color Doppler. Birthdate: Patient birthdate: 31-Aug-1965. Age: Patient is 52 yr old. Sex: Gender: male. BMI: 44.2 kg/m^2. Blood pressure: 133/84 Patient status: Outpatient. Study date: Study date: 07/28/2017. Study time: 05:07 PM.  -------------------------------------------------------------------  ------------------------------------------------------------------- Left ventricle: Wall thickness was increased in a pattern of mild LVH. Systolic function was severely reduced. The estimated ejection fraction was in the range of 20% to 25%. Images were inadequate for LV wall motion assessment. The study is not technically sufficient to allow evaluation of LV diastolic function.  ------------------------------------------------------------------- Aortic valve: Moderately calcified annulus. Trileaflet; mildly calcified leaflets. Doppler: There was mild to moderate regurgitation - not well defined. Large, globular and  calcified density noted in LVOT just proximal to the aortic valve. 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 Due to large size this appears to obstruct flow through the LVOT which could be impacting cardiac output and therefore resulting tachycardia.  High suspicion for valvular vegetation, although could be more chronic finding due to calcification. TEE is indicated to evaluate further.  ------------------------------------------------------------------- Aorta: Aortic root: The aortic root was normal in size.  ------------------------------------------------------------------- Mitral valve: Moderately to severely calcified annulus. Doppler: There was mild regurgitation. Valve area by continuity equation (using LVOT flow): 2.45 cm^2. Indexed valve area by continuity equation (using LVOT flow): 0.87 cm^2/m^2. Mean gradient (D): 4 mm Hg. Peak gradient (D): 10 mm Hg.  ------------------------------------------------------------------- Left atrium: The atrium was moderately dilated.  ------------------------------------------------------------------- Right ventricle: Poorly visualized. The cavity size was normal. Systolic function was reduced.  ------------------------------------------------------------------- Pulmonic valve: The valve appears to be grossly normal. Doppler: There was trivial regurgitation.  ------------------------------------------------------------------- Tricuspid valve: Mildly thickened leaflets. Doppler: There was mild regurgitation.  ------------------------------------------------------------------- Right atrium: The atrium was normal in size.  ------------------------------------------------------------------- Pericardium: There was no pericardial  effusion.  ------------------------------------------------------------------- Systemic veins: Inferior vena cava: Poorly visualized.  ------------------------------------------------------------------- Measurements  Left ventricle Value Reference LV ID, ED, PLAX chordal (H) 64.9 mm 43 - 52 LV ID, ES, PLAX chordal (H) 48.5 mm 23 - 38 LV fx shortening, PLAX chordal (L) 25 % >=29 LV PW thickness, ED 10.7 mm --------- IVS/LV PW ratio, ED 1 <=1.3 Stroke volume, 2D 70 ml --------- Stroke volume/bsa, 2D 25 ml/m^2 --------- Longitudinal strain, TDI 6 % ---------  Ventricular septum Value Reference IVS thickness, ED 10.7 mm ---------  LVOT Value Reference LVOT ID, S 21 mm --------- LVOT area 3.46 cm^2 --------- LVOT peak velocity, S 103 cm/s --------- LVOT mean velocity, S 81.3 cm/s --------- LVOT VTI, S 20.3 cm ---------  Aortic valve Value Reference Aortic regurg pressure half-time 277 ms ---------  Aorta Value Reference Aortic root ID, ED 36 mm ---------  Left atrium Value Reference LA ID, A-P, ES 47 mm --------- LA ID/bsa, A-P 1.67 cm/m^2 <=2.2 LA volume, S 151 ml  --------- LA volume/bsa, S 53.7 ml/m^2 --------- LA volume, ES, 1-p A4C 124 ml --------- LA volume/bsa, ES, 1-p A4C 44.1 ml/m^2 --------- LA volume, ES, 1-p A2C 157 ml --------- LA volume/bsa, ES, 1-p A2C 55.9 ml/m^2 ---------  Mitral valve Value Reference Mitral E-wave peak velocity 159 cm/s --------- Mitral A-wave peak velocity 57 cm/s --------- Mitral mean velocity, D 89 cm/s --------- Mitral deceleration time (L) 87 ms 150 - 230 Mitral mean gradient, D 4 mm Hg --------- Mitral peak gradient, D 10 mm Hg --------- Mitral E/A ratio, peak 2.8 --------- Mitral valve area, LVOT continuity 2.45 cm^2 --------- Mitral valve area/bsa, LVOT 0.87 cm^2/m^2 --------- continuity Mitral annulus VTI, D 28.7 cm --------- Mitral regurg VTI, PISA 123 cm --------- Mitral ERO, PISA 0.24 cm^2 --------- Mitral regurg volume, PISA 30 ml ---------  Tricuspid valve Value Reference Tricuspid peak RV-RA gradient 23 mm Hg ---------  Right ventricle Value Reference TAPSE 27.2 mm ---------  Legend: (L) and (H) mark values outside specified reference range.  ------------------------------------------------------------------- Prepared and Electronically Authenticated by  Rozann Lesches, M.D. 2019-04-12T16:35:28   Transesophageal Echocardiography  Patient: Mendel, Binsfeld MR #:  [id number] Study Date: 07/31/2017 Gender: M Age: 60 Height: 182.9 cm Weight: 148.5 kg BSA: 2.82 m^2 Pt. Status: Room:  PERFORMING Jenkins Rouge, M.D. [address] [place] [id number] CNOBSJGG EZ[place], [place] [id number] [place] [id number] ADMITTING Vianne Bulls SONOGRAPHER Cardell Peach, RDCS  cc:  ------------------------------------------------------------------- LV EF: 25% - 30%  ------------------------------------------------------------------- History: PMH: Aortic Valve mass  Syncope. Congestive heart failure. Risk factors: Hypertension.  ------------------------------------------------------------------- Study Conclusions  - Left ventricle: Hypertrophy was noted. Systolic function  was severely reduced. The estimated ejection fraction was in the range of 25% to 30%. Diffuse hypokinesis. - Aortic valve: Valve is trileaflet and severely thickened. Did not think there was a mass or vegetation on valve Nodular calcification of leaflets wtith bulky calcium. There was significatn shadowing of the mid esohageal views obscuring the LVOT but had good images from trans gastic imaging There was mild stenosis. There was severe regurgitation. - Mitral valve: Moderately to severely calcified annulus. Moderately thickened, moderately calcified leaflets . There was moderate regurgitation. - Left atrium: The atrium was dilated. No evidence of thrombus in the atrial cavity or appendage. - Right ventricle: The cavity size was mildly dilated. - Right atrium: No evidence of thrombus in the atrial cavity or appendage. - Atrial septum: No defect or patent foramen ovale was identified. - Impressions: 3D rendering of the Aortic and mitral valves was performed. Note patient was intubated for procedure by Dr Glennon Mac Anesthesia due to low BP, coughing and need for propopofol for  sedation.  Impressions:  - 3D rendering of the Aortic and mitral valves was performed. Note patient was intubated for procedure by Dr Glennon Mac Anesthesia due to low BP, coughing and need for propopofol for sedation.  ------------------------------------------------------------------- Study data: Study status: Routine. Consent: The risks, benefits, and alternatives to the procedure were explained to the patient and informed consent was obtained. Procedure: Initial setup. The patient was brought to the laboratory. Surface ECG leads were monitored. Sedation. Conscious sedation was administered by anesthesiology staff. Transesophageal echocardiography. Topical anesthesia was obtained using viscous lidocaine. A transesophageal probe was inserted by the attending cardiologist. Image quality was adequate. Study completion: The patient tolerated the procedure well. There were no complications. Diagnostic transesophageal echocardiography. 2D and color Doppler. Birthdate: Patient birthdate: 1965/08/08. Age: Patient is 52 yr old. Sex: Gender: male. BMI: 44.4 kg/m^2. Blood pressure: 109/68 Patient status: Inpatient. Study date: Study date: 07/31/2017. Study time: 12:13 PM. Location: Endoscopy.  -------------------------------------------------------------------  ------------------------------------------------------------------- Left ventricle: Hypertrophy was noted. Systolic function was severely reduced. The estimated ejection fraction was in the range of 25% to 30%. Diffuse hypokinesis.  ------------------------------------------------------------------- Aortic valve: Valve is trileaflet and severely thickened. Did not think there was a mass or vegetation on valve Nodular calcification of leaflets wtith bulky calcium. There was significatn shadowing of the mid esohageal views obscuring the LVOT but had good images from trans gastic imaging  Trileaflet; severely thickened, severely calcified leaflets. Doppler: There was mild stenosis. There was severe regurgitation. VTI ratio of LVOT to aortic valve: 0.37. Peak velocity ratio of LVOT to aortic valve: 0.4. Mean velocity ratio of LVOT to aortic valve: 0.4. Mean gradient (S): 3 mm Hg. Peak gradient (S): 5 mm Hg.  ------------------------------------------------------------------- Mitral valve: Moderately to severely calcified annulus. Moderately thickened, moderately calcified leaflets . Doppler: There was moderate regurgitation.  ------------------------------------------------------------------- Left atrium: The atrium was dilated. No evidence of thrombus in the atrial cavity or appendage.  ------------------------------------------------------------------- Atrial septum: No defect or patent foramen ovale was identified.  ------------------------------------------------------------------- Right ventricle: The cavity size was mildly dilated.  ------------------------------------------------------------------- Pulmonic valve: Doppler: There was mild regurgitation.  ------------------------------------------------------------------- Tricuspid valve: Doppler: There was mild regurgitation.  ------------------------------------------------------------------- Right atrium: The atrium was normal in size. No evidence of thrombus in the atrial cavity or appendage.  ------------------------------------------------------------------- Pericardium: The pericardium was normal in appearance. There was no pericardial effusion.  ------------------------------------------------------------------- Measurements  LVOT Value LVOT peak velocity, S 47 cm/s LVOT mean velocity, S 32.9 cm/s LVOT VTI, S 7.97 cm LVOT peak gradient,  S 1 mm  Hg  Aortic valve Value Aortic valve peak velocity, S 117 cm/s Aortic valve mean velocity, S 81.9 cm/s Aortic valve VTI, S 21.8 cm Aortic mean gradient, S 3 mm Hg Aortic peak gradient, S 5 mm Hg VTI ratio, LVOT/AV 0.37 Velocity ratio, peak, LVOT/AV 0.4 Velocity ratio, mean, LVOT/AV 0.4 Aortic regurg pressure half-time 255 ms  Legend: (L) and (H) mark values outside specified reference range.  ------------------------------------------------------------------- Prepared and Electronically Authenticated by  Jenkins Rouge, M.D. 2019-04-15T13:30:36   RIGHT/LEFT HEART CATH AND CORONARY ANGIOGRAPHY  Conclusion   1. Severe ostial/proximal LCx stenosis.  Will need bypass of this vessel when he has cardiac surgery.  2. Filling pressures remain elevated despite HD today.  Would repeat HD tomorrow.  3. Preserved cardiac output.  4. LV-gram and aortic root shot not done with LVEDP elevated to 32 mmHg.   Of note, patient arrived to cath lab in SVT rate in 150s.  He did not break with adenosine but went back into NSR after Lopressor 5 mg IV bolus.   Procedural Details/Technique   Technical Details Procedure: Right Heart Cath, Left Heart Cath, Selective Coronary Angiography, LV angiography  Indication: Cardiomyopathy of uncertain etiology.   Procedural Details: The right groin was prepped, draped, and anesthetized with 1% lidocaine. Using the modified Seldinger technique a 5 French sheath was placed in the right femoral artery and a 7 French sheath was placed in the right femoral vein. A Swan-Ganz catheter was used for the right heart catheterization. Standard protocol was followed for recording of right heart pressures and sampling of oxygen saturations. Fick and thermodilution cardiac output was calculated. EBU4 and JR4 were used for selective  coronary angiography. LV-gram and aortic root shot not done due to high LVEDP. There were no immediate procedural complications. The patient was transferred to the post catheterization recovery area for further monitoring.   Estimated blood loss <50 mL.  During this procedure the patient was administered the following to achieve and maintain moderate conscious sedation: Versed 1 mg, Fentanyl 50 mcg, while the patient's heart rate, blood pressure, and oxygen saturation were continuously monitored. The period of conscious sedation was 60 minutes, of which I was present face-to-face 100% of this time.  Coronary Findings   Diagnostic  Dominance: Right  Left Main  No significant disease.  Left Anterior Descending  40% mid LAD stenosis. Small D2 with 95% ostial stenosis. Small D3 with 70% mid vessel stenosis.  Left Circumflex  Serial 80% ostial and 80% proximal LCx. Large vessel.  Right Coronary Artery  Luminal irregularities.  Intervention   No interventions have been documented.  Right Heart   Right Heart Pressures RHC Procedural Findings: Hemodynamics (mmHg) RA mean 11 RV 51/14 PA 53/24, mean 37 PCWP mean 26 LV 105/32 AO 97/65  Oxygen saturations: PA 61% AO 91%  Cardiac Output (Fick) 7  Cardiac Index (Fick) 2.67  PVR 1.6 WU  Cardiac Output (Thermo) 6.4 Cardiac Index (Thermo) 2.44  Wall Motion   Not done, high LVEDP.         Implants    No implant documentation for this case.  MERGE Images   Show images for CARDIAC CATHETERIZATION   Link to Procedure Log   Procedure Log    Hemo Data    Most Recent Value  Fick Cardiac Output 7 L/min  Fick Cardiac Output Index 2.67 (L/min)/BSA  Thermal Cardiac Output 6.4 L/min  Thermal Cardiac Output Index 2.44 (L/min)/BSA  RA A Wave 15 mmHg  RA V Wave 12 mmHg  RA Mean 11 mmHg  RV Systolic Pressure 51 mmHg  RV Diastolic Pressure 7 mmHg  RV EDP 14 mmHg  PA Systolic Pressure 53 mmHg  PA Diastolic Pressure 24 mmHg  PA  Mean 37 mmHg  PW A Wave 28 mmHg  PW V Wave 26 mmHg  PW Mean 26 mmHg  AO Systolic Pressure 97 mmHg  AO Diastolic Pressure 65 mmHg  AO Mean 80 mmHg  LV Systolic Pressure 826 mmHg  LV Diastolic Pressure 12 mmHg  LV EDP 32 mmHg  Arterial Occlusion Pressure Extended Systolic Pressure 415 mmHg  Arterial Occlusion Pressure Extended Diastolic Pressure 64 mmHg  Arterial Occlusion Pressure Extended Mean Pressure 80 mmHg  Left Ventricular Apex Extended Systolic Pressure 830 mmHg  Left Ventricular Apex Extended Diastolic Pressure 9 mmHg  Left Ventricular Apex Extended EDP Pressure 32 mmHg  TPVR Index 15.15 HRUI  TSVR Index 32.75 HRUI  PVR SVR Ratio 0.16  TPVR/TSVR Ratio 0.46     Impression:  Patient has stage D severe symptomatic primary aortic insufficiency, moderate mitral regurgitation, multivessel coronary artery disease including high-grade proximal stenosis of the left circumflex coronary artery with severe left ventricular systolic dysfunction, and long-standing history of hypertension with diastolic dysfunction complicated by dialysis dependent end-stage renal disease.  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  I have personally reviewed the patient's recent transthoracic and transesophageal echocardiograms.  The aortic valve is trileaflet.  There is severe aortic insufficiency with an eccentric jet suggestive of possible leaflet prolapse and/or perforation.  There is very mild thickening of the leaflets but nothing that appears pathognomonic for underlying rheumatic disease.  There is no clear evidence of vegetation or leaflet perforation, but it is possible the patient's valvular disease could be related to previous history of bacterial endocarditis.  The patient has moderate mitral regurgitation with what appears to be primarily  normal leaflet mobility.  This may be purely secondary MR although there is some sclerosis of the posterior leaflet, posterior mitral annular calcification, and the possibility of significant leaflet restriction.  I feel the patient would best be treated aortic replacement and coronary artery bypass grafting.  It is possible that he might need concomitant mitral valve repair as well.    Risks associated with surgery will be fairly high because of the patient's degree of left ventricular systolic dysfunction and his numerous comorbid medical problems, most notably dialysis dependent renal failure.   Plan:  The patient and his wife were again counseled at length regarding treatment alternatives for management of severe aortic insufficiency including continued medical therapy versus proceeding with aortic valve replacement in the near future.  The natural history of aortic insufficiency was reviewed, as was long term prognosis with medical therapy alone.  The need for coronary artery bypass grafting was discussed as well as the possibility of need for concomitant mitral valve repair or replacement.  Surgical options were discussed at length including a comparison the relative risks of mechanical valve replacement with need for lifelong anticoagulation versus use of a bioprosthetic tissue valve and the associated potential for late structural valve deterioration and failure.  This discussion was placed in the context of the patient's particular circumstances durability of bioprosthetic tissue valve in the setting of end-stage renal disease.including the potential for shortened.  As a result the patient specifically requests that his valve be replaced  using a mechanical valve.  The patient understands and accepts all potential associated risks of surgery including but not limited to risk of death, stroke, myocardial infarction, congestive heart failure, respiratory failure, renal failure, pneumonia, bleeding  requiring blood transfusion and or reexploration, arrhythmia, heart block or bradycardia requiring permanent pacemaker, aortic dissection or other major vascular complication, pleural effusions or other delayed complications related to continued congestive heart failure, other late complications related to valve replacement including structural valve deterioration and failure, thrombosis, endocarditis, or paravalvular leak, chronic pain or other delayed complications related to median sternotomy, or the late recurrence of symptomatic ischemic heart disease and/or congestive heart failure.  All questions answered.  We plan to proceed with surgery on Friday, Sep 01, 2017.  The patient will continue all current medications through the evening prior to surgery.  He will not take any medications on the morning of surgery.  I spent in excess of 30 minutes during the conduct of this office consultation and >50% of this time involved direct face-to-face encounter with the patient for counseling and/or coordination of their care.    Valentina Gu. Roxy Manns, MD 08/21/2017 4:24 PM

## 2017-08-21 NOTE — Pre-Procedure Instructions (Signed)
[name] [name]  08/21/2017      [facility] (ESRD Bundle Only) - [place], [place] - [address] [place] [address] Phone: [phone] Fax: [phone]  [facility] [address], [place] - [address] [place] [address] [place] [address] Phone: [phone] Fax: [phone]  [place], [place] - [address] [place] [address] Phone: [phone] Fax: [phone]    Your procedure is scheduled on Fri. May 17  Report to [facility] Admitting at 5:30 A.M.  Call this number if you have problems the morning of surgery:  [phone]   Remember:  Do not eat food or drink liquids after midnight on Thurs. May 16   Take these medicines the morning of surgery with A SIP OF WATER : metoprolol (toprol-XL), ivabradine (corlanor) oxycodone if needed, nitroglycerine if needed               7 days prior to surgery STOP taking any Aspirin(unless otherwise instructed by your surgeon), Aleve, Naproxen, Ibuprofen, Motrin, Advil, Goody's, BC's, all herbal medications, fish oil, and all vitamins   Do not wear jewelry, make-up or nail polish.  Do not wear lotions, powders, or perfumes, or deodorant.  Do not shave 48 hours prior to surgery.  Men may shave face and neck.  Do not bring valuables to the hospital.  [facility] is not responsible for any belongings or valuables.  Contacts, dentures or bridgework may not be worn into surgery.  Leave your suitcase in the car.  After surgery it may be brought to your room.  For patients admitted to the hospital, discharge time will be determined by your treatment team.  Patients discharged the day of surgery will not be allowed to drive home.    Special instructions:   - Preparing For Surgery  Before surgery, you can play an important role. Because skin is not sterile, your skin needs to be as free of germs as possible. You  can reduce the number of germs on your skin by washing with CHG (chlorahexidine gluconate) Soap before surgery.  CHG is an antiseptic cleaner which kills germs and bonds with the skin to continue killing germs even after washing.  Please do not use if you have an allergy to CHG or antibacterial soaps. If your skin becomes reddened/irritated stop using the CHG.  Do not shave (including legs and underarms) for at least 48 hours prior to first CHG shower. It is OK to shave your face.  Please follow these instructions carefully.   1. Shower the NIGHT BEFORE SURGERY and the MORNING OF SURGERY with CHG.   2. If you chose to wash your hair, wash your hair first as usual with your normal shampoo.  3. After you shampoo, rinse your hair and body thoroughly to remove the shampoo.  4. Use CHG as you would any other liquid soap. You can apply CHG directly to the skin and wash gently with a scrungie or a clean washcloth.   5. Apply the CHG Soap to your body ONLY FROM THE NECK DOWN.  Do not use on open wounds or open sores. Avoid contact with your eyes, ears, mouth and genitals (private parts). Wash Face and genitals (private parts)  with your normal soap.  6. Wash thoroughly, paying special attention to the area where your surgery will be performed.  7. Thoroughly rinse your body with warm  water from the neck down.  8. DO NOT shower/wash with your normal soap after using and rinsing off the CHG Soap.  9. Pat yourself dry with a CLEAN TOWEL.  10. Wear CLEAN PAJAMAS to bed the night before surgery, wear comfortable clothes the morning of surgery  11. Place CLEAN SHEETS on your bed the night of your first shower and DO NOT SLEEP WITH PETS.    Day of Surgery: Do not apply any deodorants/lotions. Please wear clean clothes to the hospital/surgery center.      Please read over the following fact sheets that you were given. Coughing and Deep Breathing, MRSA Information and Surgical Site Infection  Prevention

## 2017-08-22 ENCOUNTER — Other Ambulatory Visit: Payer: Self-pay | Admitting: *Deleted

## 2017-08-22 DIAGNOSIS — Z992 Dependence on renal dialysis: Secondary | ICD-10-CM | POA: Diagnosis not present

## 2017-08-22 DIAGNOSIS — I351 Nonrheumatic aortic (valve) insufficiency: Secondary | ICD-10-CM

## 2017-08-22 DIAGNOSIS — I2510 Atherosclerotic heart disease of native coronary artery without angina pectoris: Secondary | ICD-10-CM

## 2017-08-22 DIAGNOSIS — N186 End stage renal disease: Secondary | ICD-10-CM | POA: Diagnosis not present

## 2017-08-22 DIAGNOSIS — N2581 Secondary hyperparathyroidism of renal origin: Secondary | ICD-10-CM | POA: Diagnosis not present

## 2017-08-22 DIAGNOSIS — E611 Iron deficiency: Secondary | ICD-10-CM | POA: Diagnosis not present

## 2017-08-22 NOTE — Progress Notes (Signed)
Anesthesia Chart Review:  Case:  [id number] Date/Time:  09/01/17 0715   Procedures:      AORTIC VALVE REPLACEMENT (AVR) (N/A Chest)     CORONARY ARTERY BYPASS GRAFTING (CABG) (N/A Chest)     TRANSESOPHAGEAL ECHOCARDIOGRAM (TEE) (N/A )   Anesthesia type:  General   Pre-op diagnosis:      SEVERE AI     CAD   Location:  [facility] OR ROOM 15 / [facility]   Surgeon:  [name], MD      DISCUSSION: Patient is a 52 year old male scheduled for the above procedure. He is s/p dental procedure 08/07/17 in anticipation of this surgery. He was discharged from [facility] following 07/28/17-08/03/17 admission for acute on chronic combined systolic and diastolic HF. (He was referred to the ED following out-patient echo results showing EF 20-25%, mild-moderate AR, possible AV vegetation or calcification causing LVOT, mild MR, reduced RV systolic function.) No vegetation noted on TEE, but had severe AR and mild AS, moderate MR, EF 25-30% with diffuse hypokinesis. CT surgery consulted and AV repair or replacement recommended. Cardiac cath which suggested he may also need grafting to his CX at that time of his aortic surgery. Other history includes ESRD (hemodialysis TTS, [facility]), morbid obesity, suspected OSA (pending sleep study). He has been on Eliquis in the recent past for recurrent AVG thrombosis, but he is not currently taking (appears last dose was prior to dental procedure; per dialysis staff, patient felt Eliquis was too expensive and would consider other options in the future).   He is for ISTAT4, repeat PTT on the day of surgery. He will bring in a UA specimen if able to void.  VS: BP (!) 107/59   Pulse 91   Temp 36.7 C   Resp 18   Ht 6' (1.829 m)   Wt (!) 317 lb 12.8 oz (144.2 kg)   SpO2 100%   BMI 43.10 kg/m    PROVIDERS: - No PCP - Cardiologist is Kate Sable, MD. Re-established care on 07/20/17 for evaluation of tachycardia, following previously cardiology evaluation by Dr. Dola Argyle in 2013. - HF Cardiologist is Loralie Champagne, MD. Established during recent hospitalization. Last visit 08/14/17.  - Nephrologist is Fran Lowes, MD   LABS: Preoperative labs reviewed. He will need an ISTAT4 on the day of surgery given ESRD history. Will repeat PTT since elevated. He is a renal patient and rarely voids, but will attempt to bring in a urine specimen on the day of surgery. (all labs ordered are listed, but only abnormal results are displayed)  Labs Reviewed  APTT - Abnormal; Notable for the following components:      Result Value   aPTT 41 (*)    All other components within normal limits  CBC - Abnormal; Notable for the following components:   MCH 25.2 (*)    All other components within normal limits  COMPREHENSIVE METABOLIC PANEL - Abnormal; Notable for the following components:   Chloride 97 (*)    BUN 62 (*)    Creatinine, Ser 15.16 (*)    Albumin 3.0 (*)    ALT 9 (*)    GFR calc non Af Amer 3 (*)    GFR calc Af Amer 4 (*)    Anion gap 19 (*)    All other components within normal limits  BLOOD GAS, ARTERIAL  HEMOGLOBIN A1C  PROTIME-INR  TYPE AND SCREEN  ABO/RH   IMAGES: 2V CXR 08/21/17: IMPRESSION: No active  cardiopulmonary disease.  OTHER:  PFTs 08/21/17: FVc 4.00 (89%), FEV1 3.21 (90%), DLCO unc 21.19 (60%).  EKG: 08/21/17: NSR.  CV: Cardiac cath 08/02/17: 1. Severe ostial/proximal LCx stenosis. Will need bypass of this vessel when he has cardiac surgery.  2. Filling pressures remain elevated despite HD today. Would repeat HD tomorrow.  3. Preserved cardiac output.  4. LV-gram and aortic root shot not done with LVEDP elevated to 32 mmHg.  Of note, patient arrived to cath lab in SVT rate in 150s. He did not break with adenosine but went back into NSR after Lopressor 5 mg IV bolus.   TEE 07/31/17: Study Conclusions - Left ventricle: Hypertrophy was noted. Systolic function was severely reduced. The estimated ejection fraction was in  the range of 25% to 30%. Diffuse hypokinesis. - Aortic valve: Valve is trileaflet and severely thickened. Did not think there was a mass or vegetation on valve Nodular calcification of leaflets wtith bulky calcium. There was significatn shadowing of the mid esohageal views obscuring the LVOT but had good images from trans gastic imaging There was mild stenosis. There was severe regurgitation. - Mitral valve: Moderately to severely calcified annulus. Moderately thickened, moderately calcified leaflets . There was moderate regurgitation. - Left atrium: The atrium was dilated. No evidence of thrombus in the atrial cavity or appendage. - Right ventricle: The cavity size was mildly dilated. - Right atrium: No evidence of thrombus in the atrial cavity or appendage. - Atrial septum: No defect or patent foramen ovale was identified. - Impressions: 3D rendering of the Aortic and mitral valves was performed. Note patient was intubated for procedure by Dr Glennon Mac Anesthesia due to low BP, coughing and need for propopofol for sedation. Impressions: - 3D rendering of the Aortic and mitral valves was performed. Note patient was intubated for procedure by Dr Glennon Mac Anesthesia due to low BP, coughing and need for propopofol for sedation.  Echo 07/28/17: Impressions: - Large, globular and calcified density noted in LVOT just proximal to the aortic valve. 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 Due to large size this appears to obstruct flow through the LVOT which could be impacting cardiac output and therefore resulting tachycardia. High suspicion for valvular vegetation, although could be more chronic finding due to calcification. TEE is indicated to evaluate further. Ordering provider out of the office. Patient being contacted by  nursing and directed to ER at [facility] in anticipation of admission for further evaluation of both cardiomyopathy and potential endocarditis.  Carotid U/S 08/21/17: Final Interpretation: Right Carotid: There was no evidence of thrombus, dissection, atherosclerotic        plaque or stenosis in the cervical carotid system. Left Carotid: There was no evidence of thrombus, dissection, atherosclerotic       plaque or stenosis in the cervical carotid system. Vertebrals: Bilateral vertebral arteries demonstrate antegrade flow. Subclavians: Normal flow hemodynamics were seen in bilateral subclavian       arteries.   Past Medical History:  Diagnosis Date  . Aneurysm ([place])     Right arm fistula 3 aneurysms 2011,   plans to have a new procedure  . Angina   . Chest discomfort   . CHF (congestive heart failure) ([place])   . Coronary artery disease   . Coronary artery disease involving native coronary artery of native heart without angina pectoris   . Dialysis patient ([place])   . Dysrhythmia   .  ESRD (end stage renal disease) ([place])    on hemodialysis T_T_S  . Hypertension   . Leg pain   . Mitral regurgitation   . Morbid obesity ([place])   . Orthostatic hypotension   . Overweight(278.02)   . Severe aortic insufficiency   . Sinus tachycardia   . Syncope     positional after dialysis... 2008    Past Surgical History:  Procedure Laterality Date  . A/V FISTULAGRAM Left 06/30/2016   Procedure: A/V Fistulagram;  Surgeon: Algernon Huxley, MD;  Location: [place] CV LAB;  Service: Cardiovascular;  Laterality: Left;  . ARTERIOVENOUS GRAFT PLACEMENT    . AV FISTULA PLACEMENT    . AV FISTULA PLACEMENT Left 11/18/2015   Procedure: ARTERIOVENOUS (AV) FISTULA CREATION ( RADIOCEPHALIC );  Surgeon: Algernon Huxley, MD;  Location: [facility] ORS;  Service: Vascular;  Laterality: Left;  . AV FISTULA PLACEMENT Left 03/23/2016   Procedure: ARTERIOVENOUS (AV) FISTULA CREATION ( REVISION );   Surgeon: Algernon Huxley, MD;  Location: [facility] ORS;  Service: Vascular;  Laterality: Left;  . [place] REMOVAL Left 03/23/2016   Procedure: REMOVAL OF ARTERIOVENOUS GORETEX GRAFT ([place]);  Surgeon: Algernon Huxley, MD;  Location: [facility] ORS;  Service: Vascular;  Laterality: Left;  . DIALYSIS/PERMA CATHETER REMOVAL N/A 08/01/2016   Procedure: Dialysis/Perma Catheter Removal;  Surgeon: Algernon Huxley, MD;  Location: [employer] CV LAB;  Service: Cardiovascular;  Laterality: N/A;  . HERNIA REPAIR    . INSERTION OF DIALYSIS CATHETER  03/01/2011   Procedure: INSERTION OF DIALYSIS CATHETER;  Surgeon: Elam Dutch, MD;  Location: [place];  Service: Vascular;  Laterality: Left;  Exchange of Dialysis Catheter to 27cm 15Fr. Arrow Catheter  . MULTIPLE EXTRACTIONS WITH ALVEOLOPLASTY N/A 08/07/2017   Procedure: Extraction of tooth #2, 7,8,13,14,15,17, and 31 with alveoloplasty and gross debridement of remaining teeth;  Surgeon: [name], DDS;  Location: [place];  Service: Oral Surgery;  Laterality: N/A;  . PERIPHERAL VASCULAR CATHETERIZATION N/A 09/01/2014   Procedure: A/V Shuntogram/Fistulagram;  Surgeon: Algernon Huxley, MD;  Location: [place] CV LAB;  Service: Cardiovascular;  Laterality: N/A;  . PERIPHERAL VASCULAR CATHETERIZATION Right 09/01/2014   Procedure: Thrombectomy;  Surgeon: Algernon Huxley, MD;  Location: [place] CV LAB;  Service: Cardiovascular;  Laterality: Right;  . PERIPHERAL VASCULAR CATHETERIZATION Right 09/01/2014   Procedure: A/V Shunt Intervention;  Surgeon: Algernon Huxley, MD;  Location: [place] CV LAB;  Service: Cardiovascular;  Laterality: Right;  . PERIPHERAL VASCULAR CATHETERIZATION N/A 09/17/2014   Procedure: A/V Shuntogram/Fistulagram;  Surgeon: Algernon Huxley, MD;  Location: [place] CV LAB;  Service: Cardiovascular;  Laterality: N/A;  . PERIPHERAL VASCULAR CATHETERIZATION Right 10/17/2014   Procedure: Thrombectomy;  Surgeon: Katha Cabal, MD;  Location: [place] CV LAB;   Service: Cardiovascular;  Laterality: Right;  . PERIPHERAL VASCULAR CATHETERIZATION N/A 10/17/2014   Procedure: A/V Shuntogram/Fistulagram;  Surgeon: Katha Cabal, MD;  Location: [place] [facility] LAB;  Service: Cardiovascular;  Laterality: N/A;  . PERIPHERAL VASCULAR CATHETERIZATION N/A 10/17/2014   Procedure: A/V Shunt Intervention;  Surgeon: Katha Cabal, MD;  Location: [place] CV LAB;  Service: Cardiovascular;  Laterality: N/A;  . PERIPHERAL VASCULAR CATHETERIZATION Right 11/04/2014   Procedure: Thrombectomy;  Surgeon: Katha Cabal, MD;  Location: [place] CV LAB;  Service: Cardiovascular;  Laterality: Right;  . PERIPHERAL VASCULAR CATHETERIZATION Left 11/04/2014   Procedure: Visceral Venography;  Surgeon: Katha Cabal, MD;  Location: [place] [facility] LAB;  Service: Cardiovascular;  Laterality: Left;  . PERIPHERAL VASCULAR CATHETERIZATION Right 11/27/2014   Procedure: A/V Shuntogram/Fistulagram;  Surgeon: Algernon Huxley, MD;  Location: [place] [facility] LAB;  Service: Cardiovascular;  Laterality: Right;  . PERIPHERAL VASCULAR CATHETERIZATION Right 11/27/2014   Procedure: A/V Shunt Intervention;  Surgeon: Algernon Huxley, MD;  Location: [place] CV LAB;  Service: Cardiovascular;  Laterality: Right;  . PERIPHERAL VASCULAR CATHETERIZATION Right 12/31/2014   Procedure: Thrombectomy;  Surgeon: Algernon Huxley, MD;  Location: [place] CV LAB;  Service: Cardiovascular;  Laterality: Right;  . PERIPHERAL VASCULAR CATHETERIZATION Right 01/12/2015   Procedure: A/V Shuntogram/Fistulagram;  Surgeon: Algernon Huxley, MD;  Location: [place] CV LAB;  Service: Cardiovascular;  Laterality: Right;  . PERIPHERAL VASCULAR CATHETERIZATION N/A 01/12/2015   Procedure: A/V Shunt Intervention;  Surgeon: Algernon Huxley, MD;  Location: [place] [facility] LAB;  Service: Cardiovascular;  Laterality: N/A;  . PERIPHERAL VASCULAR CATHETERIZATION Right 01/28/2015   Procedure: Thrombectomy;  Surgeon: Algernon Huxley,  MD;  Location: [place] CV LAB;  Service: Cardiovascular;  Laterality: Right;  . PERIPHERAL VASCULAR CATHETERIZATION N/A 06/08/2015   Procedure: A/V Shuntogram/Fistulagram;  Surgeon: Algernon Huxley, MD;  Location: [place] [facility] LAB;  Service: Cardiovascular;  Laterality: N/A;  . PERIPHERAL VASCULAR CATHETERIZATION N/A 06/08/2015   Procedure: A/V Shunt Intervention;  Surgeon: Algernon Huxley, MD;  Location: [place] [facility] LAB;  Service: Cardiovascular;  Laterality: N/A;  . PERIPHERAL VASCULAR CATHETERIZATION Right 07/06/2015   Procedure: A/V Shuntogram/Fistulagram;  Surgeon: Algernon Huxley, MD;  Location: [place] [facility];  Service: Cardiovascular;  Laterality: Right;  . PERIPHERAL VASCULAR CATHETERIZATION N/A 07/06/2015   Procedure: A/V Shunt Intervention;  Surgeon: Algernon Huxley, MD;  Location: [place] [facility] LAB;  Service: Cardiovascular;  Laterality: N/A;  . PERIPHERAL VASCULAR CATHETERIZATION N/A 08/04/2015   Procedure: graft declot;  Surgeon: Katha Cabal, MD;  Location: [place] [facility] LAB;  Service: Cardiovascular;  Laterality: N/A;  . PERIPHERAL VASCULAR CATHETERIZATION Right 08/25/2015   Procedure: A/V Shuntogram/Fistulagram;  Surgeon: Katha Cabal, MD;  Location: [place] [facility];  Service: Cardiovascular;  Laterality: Right;  . PERIPHERAL VASCULAR CATHETERIZATION N/A 11/04/2015   Procedure: Dialysis/Perma Catheter Insertion;  Surgeon: Algernon Huxley, MD;  Location: [place] [facility];  Service: Cardiovascular;  Laterality: N/A;  . PERIPHERAL VASCULAR CATHETERIZATION Left 12/14/2015   Procedure: A/V Shuntogram/Fistulagram;  Surgeon: Algernon Huxley, MD;  Location: [place] [facility] LAB;  Service: Cardiovascular;  Laterality: Left;  . PERIPHERAL VASCULAR CATHETERIZATION N/A 12/14/2015   Procedure: A/V Shunt Intervention;  Surgeon: Algernon Huxley, MD;  Location: [place] [facility] LAB;  Service: Cardiovascular;  Laterality: N/A;  . PERIPHERAL VASCULAR CATHETERIZATION N/A 12/17/2015    Procedure: Dialysis/Perma Catheter Insertion;  Surgeon: Algernon Huxley, MD;  Location: [place] [facility];  Service: Cardiovascular;  Laterality: N/A;  . PERIPHERAL VASCULAR CATHETERIZATION Left 12/22/2015   Procedure: Dialysis/Perma Catheter Insertion;  Surgeon: Katha Cabal, MD;  Location: [place] [facility] LAB;  Service: Cardiovascular;  Laterality: Left;  . PERIPHERAL VASCULAR CATHETERIZATION Left 02/29/2016   Procedure: A/V Shuntogram/Fistulagram;  Surgeon: Algernon Huxley, MD;  Location: [place] [facility] LAB;  Service: Cardiovascular;  Laterality: Left;  . PERIPHERAL VASCULAR CATHETERIZATION N/A 02/29/2016   Procedure: A/V Shunt Intervention;  Surgeon: Algernon Huxley, MD;  Location: [place] [facility];  Service: Cardiovascular;  Laterality: N/A;  . right arm graft     for dyalisis  . RIGHT/LEFT HEART CATH  AND CORONARY ANGIOGRAPHY N/A 08/02/2017   Procedure: RIGHT/LEFT HEART CATH AND CORONARY ANGIOGRAPHY;  Surgeon: Larey Dresser, MD;  Location: [place] [facility] LAB;  Service: Cardiovascular;  Laterality: N/A;  . TEE WITHOUT CARDIOVERSION N/A 07/31/2017   Procedure: TRANSESOPHAGEAL ECHOCARDIOGRAM (TEE);  Surgeon: Josue Hector, MD;  Location[facility] ENDOSCOPY;  Service: Cardiovascular;  Laterality: N/A;  . THROMBECTOMY    . THROMBECTOMY W/ EMBOLECTOMY  03/01/2011   Procedure: THROMBECTOMY ARTERIOVENOUS GORE-TEX GRAFT;  Surgeon: Elam Dutch, MD;  Location: [place];  Service: Vascular;  Laterality: Right;  Attempted Thrombectomy of Old  Right Upper Arm Arteriovenous gortex Graft. Insertion of new Arteriovenous Graft using 3mm x 50cm Gortex Stretch graft.   . THROMBECTOMY W/ EMBOLECTOMY  07/11/2011   Procedure: THROMBECTOMY ARTERIOVENOUS GORE-TEX GRAFT;  Surgeon: Rosetta Posner, MD;  Location: [place];  Service: Vascular;  Laterality: Right;  . UMBILICAL HERNIA REPAIR    . VENOGRAM N/A 08/23/2011   Procedure: VENOGRAM;  Surgeon: [name], MD;  Location: [facility] CATH LAB;  Service: Cardiovascular;   Laterality: N/A;    MEDICATIONS: . atorvastatin (LIPITOR) 40 MG tablet  . B Complex-C-Folic Acid (NEPHRO-VITE PO)  . benzonatate (TESSALON) 100 MG capsule  . chlorhexidine (PERIDEX) 0.12 % solution  . FOSRENOL 500 MG chewable tablet  . ivabradine (CORLANOR) 5 MG TABS tablet  . metoprolol succinate (TOPROL-XL) 50 MG 24 hr tablet  . midodrine (PROAMATINE) 5 MG tablet  . nitroGLYCERIN (NITROSTAT) 0.4 MG SL tablet  . oxyCODONE-acetaminophen (PERCOCET) 5-325 MG tablet   No current facility-administered medications for this encounter.    George [facility] Short Stay Center/Anesthesiology Phone [phone] 08/22/2017 11:32 AM

## 2017-08-24 DIAGNOSIS — N2581 Secondary hyperparathyroidism of renal origin: Secondary | ICD-10-CM | POA: Diagnosis not present

## 2017-08-24 DIAGNOSIS — Z992 Dependence on renal dialysis: Secondary | ICD-10-CM | POA: Diagnosis not present

## 2017-08-24 DIAGNOSIS — E611 Iron deficiency: Secondary | ICD-10-CM | POA: Diagnosis not present

## 2017-08-24 DIAGNOSIS — N186 End stage renal disease: Secondary | ICD-10-CM | POA: Diagnosis not present

## 2017-08-26 DIAGNOSIS — E611 Iron deficiency: Secondary | ICD-10-CM | POA: Diagnosis not present

## 2017-08-26 DIAGNOSIS — Z992 Dependence on renal dialysis: Secondary | ICD-10-CM | POA: Diagnosis not present

## 2017-08-26 DIAGNOSIS — N2581 Secondary hyperparathyroidism of renal origin: Secondary | ICD-10-CM | POA: Diagnosis not present

## 2017-08-26 DIAGNOSIS — N186 End stage renal disease: Secondary | ICD-10-CM | POA: Diagnosis not present

## 2017-08-29 DIAGNOSIS — N186 End stage renal disease: Secondary | ICD-10-CM | POA: Diagnosis not present

## 2017-08-29 DIAGNOSIS — N2581 Secondary hyperparathyroidism of renal origin: Secondary | ICD-10-CM | POA: Diagnosis not present

## 2017-08-29 DIAGNOSIS — E611 Iron deficiency: Secondary | ICD-10-CM | POA: Diagnosis not present

## 2017-08-29 DIAGNOSIS — Z992 Dependence on renal dialysis: Secondary | ICD-10-CM | POA: Diagnosis not present

## 2017-08-31 DIAGNOSIS — N2581 Secondary hyperparathyroidism of renal origin: Secondary | ICD-10-CM | POA: Diagnosis not present

## 2017-08-31 DIAGNOSIS — Z992 Dependence on renal dialysis: Secondary | ICD-10-CM | POA: Diagnosis not present

## 2017-08-31 DIAGNOSIS — E611 Iron deficiency: Secondary | ICD-10-CM | POA: Diagnosis not present

## 2017-08-31 DIAGNOSIS — N186 End stage renal disease: Secondary | ICD-10-CM | POA: Diagnosis not present

## 2017-08-31 MED ORDER — SODIUM CHLORIDE 0.9 % IV SOLN
1500.0000 mg | INTRAVENOUS | Status: AC
Start: 2017-09-01 — End: 2017-09-01
  Administered 2017-09-01: 1500 mg via INTRAVENOUS
  Filled 2017-08-31: qty 1500

## 2017-08-31 MED ORDER — DEXMEDETOMIDINE HCL IN NACL 400 MCG/100ML IV SOLN
0.1000 ug/kg/h | INTRAVENOUS | Status: AC
  Administered 2017-09-01: 0.7 ug/kg/h via INTRAVENOUS
  Filled 2017-08-31: qty 100

## 2017-08-31 MED ORDER — VANCOMYCIN HCL 1000 MG IV SOLR
INTRAVENOUS | Status: AC
  Administered 2017-09-01: 08:00:00
  Filled 2017-08-31: qty 1000

## 2017-08-31 MED ORDER — TRANEXAMIC ACID 1000 MG/10ML IV SOLN
1.5000 mg/kg/h | INTRAVENOUS | Status: AC
  Administered 2017-09-01: 1.5 mg/kg/h via INTRAVENOUS
  Filled 2017-08-31: qty 25

## 2017-08-31 MED ORDER — SODIUM CHLORIDE 0.9 % IV SOLN
30.0000 ug/min | INTRAVENOUS | Status: AC
  Administered 2017-09-01: 50 ug/min via INTRAVENOUS
  Filled 2017-08-31: qty 20

## 2017-08-31 MED ORDER — SODIUM CHLORIDE 0.9 % IV SOLN
750.0000 mg | INTRAVENOUS | Status: DC
  Filled 2017-08-31: qty 750

## 2017-08-31 MED ORDER — PAPAVERINE HCL 30 MG/ML IJ SOLN
INTRAMUSCULAR | Status: AC
  Administered 2017-09-01: 08:00:00
  Filled 2017-08-31: qty 2.5

## 2017-08-31 MED ORDER — CEFUROXIME SODIUM 1.5 G IV SOLR
1.5000 g | INTRAVENOUS | Status: AC
  Administered 2017-09-01: 1.5 g via INTRAVENOUS
  Filled 2017-08-31: qty 1.5

## 2017-08-31 MED ORDER — KENNESTONE BLOOD CARDIOPLEGIA VIAL
13.0000 mL | Freq: Once | Status: DC
  Filled 2017-08-31: qty 13

## 2017-08-31 MED ORDER — KENNESTONE BLOOD CARDIOPLEGIA (KBC) MANNITOL SYRINGE (20%, 32ML)
32.0000 mL | Freq: Once | INTRAVENOUS | Status: DC
  Filled 2017-08-31: qty 32

## 2017-08-31 MED ORDER — TRANEXAMIC ACID (OHS) BOLUS VIA INFUSION
15.0000 mg/kg | INTRAVENOUS | Status: AC
  Administered 2017-09-01: 2179.5 mg via INTRAVENOUS
  Filled 2017-08-31: qty 2180

## 2017-08-31 MED ORDER — DOPAMINE-DEXTROSE 3.2-5 MG/ML-% IV SOLN
0.0000 ug/kg/min | INTRAVENOUS | Status: DC
  Filled 2017-08-31: qty 250

## 2017-08-31 MED ORDER — INSULIN REGULAR HUMAN 100 UNIT/ML IJ SOLN
INTRAMUSCULAR | Status: AC
  Administered 2017-09-01: .7 [IU]/h via INTRAVENOUS
  Filled 2017-08-31: qty 1

## 2017-08-31 MED ORDER — TRANEXAMIC ACID (OHS) PUMP PRIME SOLUTION
2.0000 mg/kg | INTRAVENOUS | Status: DC
  Filled 2017-08-31: qty 2.91

## 2017-08-31 MED ORDER — NITROGLYCERIN IN D5W 200-5 MCG/ML-% IV SOLN
2.0000 ug/min | INTRAVENOUS | Status: DC
  Filled 2017-08-31 (×2): qty 250

## 2017-08-31 MED ORDER — POTASSIUM CHLORIDE 2 MEQ/ML IV SOLN
80.0000 meq | INTRAVENOUS | Status: DC
  Filled 2017-08-31: qty 40

## 2017-08-31 MED ORDER — EPINEPHRINE PF 1 MG/ML IJ SOLN
0.0000 ug/min | INTRAMUSCULAR | Status: DC
  Filled 2017-08-31: qty 4

## 2017-08-31 MED ORDER — MILRINONE LACTATE IN DEXTROSE 20-5 MG/100ML-% IV SOLN
0.1250 ug/kg/min | INTRAVENOUS | Status: AC
  Administered 2017-09-01: .3 ug/kg/min via INTRAVENOUS
  Filled 2017-08-31: qty 100

## 2017-08-31 MED ORDER — SODIUM CHLORIDE 0.9 % IV SOLN
INTRAVENOUS | Status: DC
  Filled 2017-08-31: qty 30

## 2017-08-31 MED ORDER — MAGNESIUM SULFATE 50 % IJ SOLN
40.0000 meq | INTRAMUSCULAR | Status: DC
  Filled 2017-08-31: qty 9.85

## 2017-08-31 MED ORDER — TRANEXAMIC ACID 1000 MG/10ML IV SOLN
1.5000 mg/kg/h | INTRAVENOUS | Status: DC
  Filled 2017-08-31: qty 25

## 2017-09-01 ENCOUNTER — Other Ambulatory Visit: Payer: Self-pay

## 2017-09-01 ENCOUNTER — Inpatient Hospital Stay (HOSPITAL_COMMUNITY)
Admission: RE | Admit: 2017-09-01 | Discharge: 2017-09-01 | Disposition: A | Discharge status: Home / Self Care | Payer: Medicare Other | Source: Ambulatory Visit | Attending: Thoracic Surgery (Cardiothoracic Vascular Surgery) | Admitting: Thoracic Surgery (Cardiothoracic Vascular Surgery)

## 2017-09-01 ENCOUNTER — Inpatient Hospital Stay (HOSPITAL_COMMUNITY): Payer: Medicare Other

## 2017-09-01 ENCOUNTER — Encounter (HOSPITAL_COMMUNITY): Payer: Self-pay | Admitting: *Deleted

## 2017-09-01 ENCOUNTER — Encounter (HOSPITAL_COMMUNITY)
Admission: RE | Disposition: A | Discharge status: Home / Self Care | Payer: Self-pay | Source: Home / Self Care | Attending: Thoracic Surgery (Cardiothoracic Vascular Surgery)

## 2017-09-01 ENCOUNTER — Inpatient Hospital Stay (HOSPITAL_COMMUNITY): Payer: Medicare Other | Admitting: Emergency Medicine

## 2017-09-01 ENCOUNTER — Inpatient Hospital Stay (HOSPITAL_COMMUNITY)
Admission: RE | Admit: 2017-09-01 | Discharge: 2017-09-10 | DRG: 219 | Disposition: A | Discharge status: Home / Self Care | Payer: Medicare Other | Attending: Thoracic Surgery (Cardiothoracic Vascular Surgery) | Admitting: Thoracic Surgery (Cardiothoracic Vascular Surgery)

## 2017-09-01 ENCOUNTER — Inpatient Hospital Stay (HOSPITAL_COMMUNITY): Payer: Medicare Other | Admitting: Vascular Surgery

## 2017-09-01 DIAGNOSIS — Z4682 Encounter for fitting and adjustment of non-vascular catheter: Secondary | ICD-10-CM | POA: Diagnosis not present

## 2017-09-01 DIAGNOSIS — N2581 Secondary hyperparathyroidism of renal origin: Secondary | ICD-10-CM | POA: Diagnosis present

## 2017-09-01 DIAGNOSIS — I351 Nonrheumatic aortic (valve) insufficiency: Secondary | ICD-10-CM | POA: Diagnosis not present

## 2017-09-01 DIAGNOSIS — D62 Acute posthemorrhagic anemia: Secondary | ICD-10-CM | POA: Diagnosis not present

## 2017-09-01 DIAGNOSIS — E875 Hyperkalemia: Secondary | ICD-10-CM | POA: Diagnosis not present

## 2017-09-01 DIAGNOSIS — K0530 Chronic periodontitis, unspecified: Secondary | ICD-10-CM | POA: Diagnosis not present

## 2017-09-01 DIAGNOSIS — I120 Hypertensive chronic kidney disease with stage 5 chronic kidney disease or end stage renal disease: Secondary | ICD-10-CM | POA: Diagnosis not present

## 2017-09-01 DIAGNOSIS — R570 Cardiogenic shock: Secondary | ICD-10-CM | POA: Diagnosis not present

## 2017-09-01 DIAGNOSIS — Z01818 Encounter for other preprocedural examination: Secondary | ICD-10-CM

## 2017-09-01 DIAGNOSIS — I2510 Atherosclerotic heart disease of native coronary artery without angina pectoris: Secondary | ICD-10-CM | POA: Diagnosis not present

## 2017-09-01 DIAGNOSIS — R918 Other nonspecific abnormal finding of lung field: Secondary | ICD-10-CM | POA: Diagnosis not present

## 2017-09-01 DIAGNOSIS — I729 Aneurysm of unspecified site: Secondary | ICD-10-CM | POA: Diagnosis not present

## 2017-09-01 DIAGNOSIS — Z79899 Other long term (current) drug therapy: Secondary | ICD-10-CM

## 2017-09-01 DIAGNOSIS — I5043 Acute on chronic combined systolic (congestive) and diastolic (congestive) heart failure: Secondary | ICD-10-CM | POA: Diagnosis not present

## 2017-09-01 DIAGNOSIS — Z8249 Family history of ischemic heart disease and other diseases of the circulatory system: Secondary | ICD-10-CM

## 2017-09-01 DIAGNOSIS — I953 Hypotension of hemodialysis: Secondary | ICD-10-CM | POA: Diagnosis not present

## 2017-09-01 DIAGNOSIS — I471 Supraventricular tachycardia: Secondary | ICD-10-CM | POA: Diagnosis not present

## 2017-09-01 DIAGNOSIS — Z992 Dependence on renal dialysis: Secondary | ICD-10-CM | POA: Diagnosis not present

## 2017-09-01 DIAGNOSIS — I480 Paroxysmal atrial fibrillation: Secondary | ICD-10-CM | POA: Diagnosis present

## 2017-09-01 DIAGNOSIS — J9601 Acute respiratory failure with hypoxia: Secondary | ICD-10-CM | POA: Diagnosis not present

## 2017-09-01 DIAGNOSIS — N186 End stage renal disease: Secondary | ICD-10-CM | POA: Diagnosis present

## 2017-09-01 DIAGNOSIS — Z419 Encounter for procedure for purposes other than remedying health state, unspecified: Secondary | ICD-10-CM

## 2017-09-01 DIAGNOSIS — D689 Coagulation defect, unspecified: Secondary | ICD-10-CM | POA: Diagnosis not present

## 2017-09-01 DIAGNOSIS — D696 Thrombocytopenia, unspecified: Secondary | ICD-10-CM | POA: Diagnosis not present

## 2017-09-01 DIAGNOSIS — Z6841 Body Mass Index (BMI) 40.0 and over, adult: Secondary | ICD-10-CM

## 2017-09-01 DIAGNOSIS — I509 Heart failure, unspecified: Secondary | ICD-10-CM

## 2017-09-01 DIAGNOSIS — I132 Hypertensive heart and chronic kidney disease with heart failure and with stage 5 chronic kidney disease, or end stage renal disease: Secondary | ICD-10-CM | POA: Diagnosis present

## 2017-09-01 DIAGNOSIS — I272 Pulmonary hypertension, unspecified: Secondary | ICD-10-CM | POA: Diagnosis present

## 2017-09-01 DIAGNOSIS — Z87891 Personal history of nicotine dependence: Secondary | ICD-10-CM

## 2017-09-01 DIAGNOSIS — Z951 Presence of aortocoronary bypass graft: Secondary | ICD-10-CM

## 2017-09-01 DIAGNOSIS — J9811 Atelectasis: Secondary | ICD-10-CM | POA: Diagnosis not present

## 2017-09-01 DIAGNOSIS — D631 Anemia in chronic kidney disease: Secondary | ICD-10-CM | POA: Diagnosis present

## 2017-09-01 DIAGNOSIS — T68XXXA Hypothermia, initial encounter: Secondary | ICD-10-CM | POA: Diagnosis not present

## 2017-09-01 DIAGNOSIS — Z9689 Presence of other specified functional implants: Secondary | ICD-10-CM

## 2017-09-01 DIAGNOSIS — Z954 Presence of other heart-valve replacement: Secondary | ICD-10-CM | POA: Diagnosis not present

## 2017-09-01 DIAGNOSIS — I5020 Unspecified systolic (congestive) heart failure: Secondary | ICD-10-CM | POA: Diagnosis present

## 2017-09-01 DIAGNOSIS — I340 Nonrheumatic mitral (valve) insufficiency: Secondary | ICD-10-CM | POA: Diagnosis present

## 2017-09-01 DIAGNOSIS — I080 Rheumatic disorders of both mitral and aortic valves: Principal | ICD-10-CM | POA: Diagnosis present

## 2017-09-01 DIAGNOSIS — I083 Combined rheumatic disorders of mitral, aortic and tricuspid valves: Secondary | ICD-10-CM | POA: Diagnosis not present

## 2017-09-01 DIAGNOSIS — I358 Other nonrheumatic aortic valve disorders: Secondary | ICD-10-CM | POA: Diagnosis not present

## 2017-09-01 HISTORY — PX: CENTRAL LINE INSERTION: CATH118232

## 2017-09-01 HISTORY — PX: INSERTION OF DIALYSIS CATHETER: SHX1324

## 2017-09-01 HISTORY — DX: Presence of aortocoronary bypass graft: Z95.1

## 2017-09-01 HISTORY — PX: TEE WITHOUT CARDIOVERSION: SHX5443

## 2017-09-01 HISTORY — DX: Presence of other heart-valve replacement: Z95.4

## 2017-09-01 HISTORY — PX: CORONARY ARTERY BYPASS GRAFT: SHX141

## 2017-09-01 HISTORY — PX: AORTIC VALVE REPLACEMENT: SHX41

## 2017-09-01 LAB — POCT I-STAT, CHEM 8
BUN: 27 mg/dL — AB (ref 6–20)
BUN: 28 mg/dL — AB (ref 6–20)
BUN: 25 mg/dL — AB (ref 6–20)
BUN: 26 mg/dL — AB (ref 6–20)
BUN: 24 mg/dL — AB (ref 6–20)
BUN: 26 mg/dL — ABNORMAL HIGH (ref 6–20)
BUN: 26 mg/dL — ABNORMAL HIGH (ref 6–20)
CALCIUM ION: 1.18 mmol/L (ref 1.15–1.40)
CALCIUM ION: 1.17 mmol/L (ref 1.15–1.40)
CALCIUM ION: 1.1 mmol/L — AB (ref 1.15–1.40)
CALCIUM ION: 1.06 mmol/L — AB (ref 1.15–1.40)
CALCIUM ION: 1.06 mmol/L — AB (ref 1.15–1.40)
CALCIUM ION: 1.16 mmol/L (ref 1.15–1.40)
CHLORIDE: 97 mmol/L — AB (ref 101–111)
CHLORIDE: 100 mmol/L — AB (ref 101–111)
CHLORIDE: 98 mmol/L — AB (ref 101–111)
CREATININE: 7.7 mg/dL — AB (ref 0.61–1.24)
CREATININE: 7.7 mg/dL — AB (ref 0.61–1.24)
CREATININE: 7.2 mg/dL — AB (ref 0.61–1.24)
CREATININE: 7.4 mg/dL — AB (ref 0.61–1.24)
Calcium, Ion: 1.18 mmol/L (ref 1.15–1.40)
Chloride: 101 mmol/L (ref 101–111)
Chloride: 102 mmol/L (ref 101–111)
Chloride: 107 mmol/L (ref 101–111)
Chloride: 106 mmol/L (ref 101–111)
Creatinine, Ser: 8 mg/dL — ABNORMAL HIGH (ref 0.61–1.24)
Creatinine, Ser: 7.5 mg/dL — ABNORMAL HIGH (ref 0.61–1.24)
Creatinine, Ser: 8.2 mg/dL — ABNORMAL HIGH (ref 0.61–1.24)
GLUCOSE: 97 mg/dL (ref 65–99)
GLUCOSE: 183 mg/dL — AB (ref 65–99)
GLUCOSE: 153 mg/dL — AB (ref 65–99)
GLUCOSE: 123 mg/dL — AB (ref 65–99)
Glucose, Bld: 95 mg/dL (ref 65–99)
Glucose, Bld: 122 mg/dL — ABNORMAL HIGH (ref 65–99)
Glucose, Bld: 131 mg/dL — ABNORMAL HIGH (ref 65–99)
HCT: 40 % (ref 39.0–52.0)
HCT: 34 % — ABNORMAL LOW (ref 39.0–52.0)
HCT: 33 % — ABNORMAL LOW (ref 39.0–52.0)
HCT: 32 % — ABNORMAL LOW (ref 39.0–52.0)
HCT: 30 % — ABNORMAL LOW (ref 39.0–52.0)
HEMATOCRIT: 44 % (ref 39.0–52.0)
HEMATOCRIT: 33 % — AB (ref 39.0–52.0)
HEMOGLOBIN: 10.9 g/dL — AB (ref 13.0–17.0)
HEMOGLOBIN: 10.2 g/dL — AB (ref 13.0–17.0)
HEMOGLOBIN: 11.2 g/dL — AB (ref 13.0–17.0)
Hemoglobin: 15 g/dL (ref 13.0–17.0)
Hemoglobin: 13.6 g/dL (ref 13.0–17.0)
Hemoglobin: 11.6 g/dL — ABNORMAL LOW (ref 13.0–17.0)
Hemoglobin: 11.2 g/dL — ABNORMAL LOW (ref 13.0–17.0)
POTASSIUM: 3.8 mmol/L (ref 3.5–5.1)
POTASSIUM: 4.9 mmol/L (ref 3.5–5.1)
Potassium: 4 mmol/L (ref 3.5–5.1)
Potassium: 3.7 mmol/L (ref 3.5–5.1)
Potassium: 3.8 mmol/L (ref 3.5–5.1)
Potassium: 3.9 mmol/L (ref 3.5–5.1)
Potassium: 4.2 mmol/L (ref 3.5–5.1)
SODIUM: 139 mmol/L (ref 135–145)
SODIUM: 141 mmol/L (ref 135–145)
Sodium: 141 mmol/L (ref 135–145)
Sodium: 139 mmol/L (ref 135–145)
Sodium: 140 mmol/L (ref 135–145)
Sodium: 140 mmol/L (ref 135–145)
Sodium: 142 mmol/L (ref 135–145)
TCO2: 28 mmol/L (ref 22–32)
TCO2: 29 mmol/L (ref 22–32)
TCO2: 29 mmol/L (ref 22–32)
TCO2: 27 mmol/L (ref 22–32)
TCO2: 25 mmol/L (ref 22–32)
TCO2: 22 mmol/L (ref 22–32)
TCO2: 23 mmol/L (ref 22–32)

## 2017-09-01 LAB — GLUCOSE, CAPILLARY
GLUCOSE-CAPILLARY: 113 mg/dL — AB (ref 65–99)
GLUCOSE-CAPILLARY: 125 mg/dL — AB (ref 65–99)
Glucose-Capillary: 110 mg/dL — ABNORMAL HIGH (ref 65–99)
Glucose-Capillary: 126 mg/dL — ABNORMAL HIGH (ref 65–99)
Glucose-Capillary: 129 mg/dL — ABNORMAL HIGH (ref 65–99)
Glucose-Capillary: 129 mg/dL — ABNORMAL HIGH (ref 65–99)

## 2017-09-01 LAB — CBC
HCT: 37.5 % — ABNORMAL LOW (ref 39.0–52.0)
HEMATOCRIT: 46.7 % (ref 39.0–52.0)
HEMATOCRIT: 36.2 % — AB (ref 39.0–52.0)
HEMOGLOBIN: 11.2 g/dL — AB (ref 13.0–17.0)
Hemoglobin: 14 g/dL (ref 13.0–17.0)
Hemoglobin: 11 g/dL — ABNORMAL LOW (ref 13.0–17.0)
MCH: 24.6 pg — AB (ref 26.0–34.0)
MCH: 25.1 pg — ABNORMAL LOW (ref 26.0–34.0)
MCH: 25.1 pg — ABNORMAL LOW (ref 26.0–34.0)
MCHC: 30 g/dL (ref 30.0–36.0)
MCHC: 29.9 g/dL — ABNORMAL LOW (ref 30.0–36.0)
MCHC: 30.4 g/dL (ref 30.0–36.0)
MCV: 82.2 fL (ref 78.0–100.0)
MCV: 84.1 fL (ref 78.0–100.0)
MCV: 82.5 fL (ref 78.0–100.0)
PLATELETS: 291 10*3/uL (ref 150–400)
Platelets: 286 10*3/uL (ref 150–400)
Platelets: 273 10*3/uL (ref 150–400)
RBC: 5.68 MIL/uL (ref 4.22–5.81)
RBC: 4.46 MIL/uL (ref 4.22–5.81)
RBC: 4.39 MIL/uL (ref 4.22–5.81)
RDW: 15 % (ref 11.5–15.5)
RDW: 15.1 % (ref 11.5–15.5)
RDW: 14.6 % (ref 11.5–15.5)
WBC: 6.8 10*3/uL (ref 4.0–10.5)
WBC: 15.5 10*3/uL — ABNORMAL HIGH (ref 4.0–10.5)
WBC: 11.3 10*3/uL — ABNORMAL HIGH (ref 4.0–10.5)

## 2017-09-01 LAB — POCT I-STAT 3, ART BLOOD GAS (G3+)
ACID-BASE EXCESS: 5 mmol/L — AB (ref 0.0–2.0)
Acid-Base Excess: 4 mmol/L — ABNORMAL HIGH (ref 0.0–2.0)
Acid-base deficit: 1 mmol/L (ref 0.0–2.0)
Bicarbonate: 28.7 mmol/L — ABNORMAL HIGH (ref 20.0–28.0)
Bicarbonate: 29.8 mmol/L — ABNORMAL HIGH (ref 20.0–28.0)
Bicarbonate: 26.3 mmol/L (ref 20.0–28.0)
O2 SAT: 100 %
O2 Saturation: 99 %
O2 Saturation: 99 %
PCO2 ART: 43.9 mmHg (ref 32.0–48.0)
PH ART: 7.424 (ref 7.350–7.450)
PH ART: 7.32 — AB (ref 7.350–7.450)
PO2 ART: 145 mmHg — AB (ref 83.0–108.0)
PO2 ART: 267 mmHg — AB (ref 83.0–108.0)
TCO2: 30 mmol/L (ref 22–32)
TCO2: 31 mmol/L (ref 22–32)
TCO2: 28 mmol/L (ref 22–32)
pCO2 arterial: 45.6 mmHg (ref 32.0–48.0)
pCO2 arterial: 50.4 mmHg — ABNORMAL HIGH (ref 32.0–48.0)
pH, Arterial: 7.424 (ref 7.350–7.450)
pO2, Arterial: 122 mmHg — ABNORMAL HIGH (ref 83.0–108.0)

## 2017-09-01 LAB — CREATININE, SERUM
Creatinine, Ser: 8.47 mg/dL — ABNORMAL HIGH (ref 0.61–1.24)
Creatinine, Ser: 8.7 mg/dL — ABNORMAL HIGH (ref 0.61–1.24)
GFR calc Af Amer: 7 mL/min — ABNORMAL LOW (ref >60)
GFR calc Af Amer: 7 mL/min — ABNORMAL LOW (ref >60)
GFR calc non Af Amer: 6 mL/min — ABNORMAL LOW (ref >60)
GFR, EST NON AFRICAN AMERICAN: 6 mL/min — AB (ref >60)

## 2017-09-01 LAB — PROTIME-INR
INR: 1.39
PROTHROMBIN TIME: 16.9 s — AB (ref 11.4–15.2)

## 2017-09-01 LAB — POCT I-STAT 4, (NA,K, GLUC, HGB,HCT)
GLUCOSE: 116 mg/dL — AB (ref 65–99)
HEMATOCRIT: 34 % — AB (ref 39.0–52.0)
HEMOGLOBIN: 11.6 g/dL — AB (ref 13.0–17.0)
Potassium: 4.6 mmol/L (ref 3.5–5.1)
Sodium: 143 mmol/L (ref 135–145)

## 2017-09-01 LAB — BASIC METABOLIC PANEL
Anion gap: 14 (ref 5–15)
BUN: 26 mg/dL — AB (ref 6–20)
CHLORIDE: 97 mmol/L — AB (ref 101–111)
CO2: 28 mmol/L (ref 22–32)
CREATININE: 8.78 mg/dL — AB (ref 0.61–1.24)
Calcium: 9.5 mg/dL (ref 8.9–10.3)
GFR calc Af Amer: 7 mL/min — ABNORMAL LOW (ref >60)
GFR calc non Af Amer: 6 mL/min — ABNORMAL LOW (ref >60)
GLUCOSE: 85 mg/dL (ref 65–99)
POTASSIUM: 3.9 mmol/L (ref 3.5–5.1)
SODIUM: 139 mmol/L (ref 135–145)

## 2017-09-01 LAB — APTT
aPTT: 36 seconds (ref 24–36)
aPTT: 39 seconds — ABNORMAL HIGH (ref 24–36)

## 2017-09-01 LAB — PLATELET COUNT: PLATELETS: 294 10*3/uL (ref 150–400)

## 2017-09-01 LAB — SURGICAL PCR SCREEN
MRSA, PCR: NEGATIVE
Staphylococcus aureus: NEGATIVE

## 2017-09-01 LAB — HEMOGLOBIN AND HEMATOCRIT, BLOOD
HEMATOCRIT: 33.7 % — AB (ref 39.0–52.0)
HEMOGLOBIN: 10.1 g/dL — AB (ref 13.0–17.0)

## 2017-09-01 LAB — FIBRINOGEN: FIBRINOGEN: 468 mg/dL (ref 210–475)

## 2017-09-01 LAB — MAGNESIUM
MAGNESIUM: 2.1 mg/dL (ref 1.7–2.4)
MAGNESIUM: 1.9 mg/dL (ref 1.7–2.4)

## 2017-09-01 SURGERY — REPLACEMENT, AORTIC VALVE, OPEN
Anesthesia: General | Site: Groin | Laterality: Right

## 2017-09-01 MED ORDER — PANTOPRAZOLE SODIUM 40 MG PO TBEC
40.0000 mg | DELAYED_RELEASE_TABLET | Freq: Every day | ORAL | Status: DC
  Administered 2017-09-04 – 2017-09-10 (×7): 40 mg via ORAL
  Filled 2017-09-01 (×8): qty 1

## 2017-09-01 MED ORDER — SODIUM CHLORIDE 0.45 % IV SOLN
INTRAVENOUS | Status: DC | PRN
  Administered 2017-09-01: 17:00:00 via INTRAVENOUS

## 2017-09-01 MED ORDER — MORPHINE SULFATE (PF) 2 MG/ML IV SOLN: 1.0000 mg | INTRAVENOUS | Status: AC | PRN

## 2017-09-01 MED ORDER — ACETAMINOPHEN 160 MG/5ML PO SOLN: 650.0000 mg | Freq: Once | ORAL | Status: AC

## 2017-09-01 MED ORDER — METOPROLOL TARTRATE 5 MG/5ML IV SOLN
2.5000 mg | INTRAVENOUS | Status: DC | PRN
Start: 2017-09-01 — End: 2017-09-02

## 2017-09-01 MED ORDER — CHLORHEXIDINE GLUCONATE 0.12 % MT SOLN
OROMUCOSAL | Status: AC
  Administered 2017-09-01: 15 mL via OROMUCOSAL
  Filled 2017-09-01: qty 15

## 2017-09-01 MED ORDER — HEPARIN SODIUM (PORCINE) 1000 UNIT/ML IJ SOLN
INTRAMUSCULAR | Status: AC
  Filled 2017-09-01: qty 1

## 2017-09-01 MED ORDER — VASOPRESSIN 20 UNIT/ML IV SOLN
0.0100 [IU]/min | INTRAVENOUS | Status: AC
  Administered 2017-09-01: 0.04 [IU]/min via INTRAVENOUS
  Filled 2017-09-01: qty 2

## 2017-09-01 MED ORDER — SODIUM CHLORIDE 0.9% FLUSH: 10.0000 mL | INTRAVENOUS | Status: DC | PRN

## 2017-09-01 MED ORDER — MIDAZOLAM HCL 2 MG/2ML IJ SOLN
INTRAMUSCULAR | Status: AC
  Filled 2017-09-01: qty 2

## 2017-09-01 MED ORDER — INSULIN REGULAR BOLUS VIA INFUSION
0.0000 [IU] | Freq: Three times a day (TID) | INTRAVENOUS | Status: DC
  Filled 2017-09-01: qty 10

## 2017-09-01 MED ORDER — ARTIFICIAL TEARS OPHTHALMIC OINT
TOPICAL_OINTMENT | OPHTHALMIC | Status: AC
  Filled 2017-09-01: qty 3.5

## 2017-09-01 MED ORDER — PHENYLEPHRINE 40 MCG/ML (10ML) SYRINGE FOR IV PUSH (FOR BLOOD PRESSURE SUPPORT)
PREFILLED_SYRINGE | INTRAVENOUS | Status: AC
  Filled 2017-09-01: qty 10

## 2017-09-01 MED ORDER — FAMOTIDINE IN NACL 20-0.9 MG/50ML-% IV SOLN
20.0000 mg | Freq: Two times a day (BID) | INTRAVENOUS | Status: AC
  Administered 2017-09-01 (×2): 20 mg via INTRAVENOUS
  Filled 2017-09-01: qty 50

## 2017-09-01 MED ORDER — SODIUM CHLORIDE 0.9 % IV SOLN: Freq: Once | INTRAVENOUS | Status: DC

## 2017-09-01 MED ORDER — ORAL CARE MOUTH RINSE
15.0000 mL | OROMUCOSAL | Status: DC
  Administered 2017-09-01 – 2017-09-03 (×16): 15 mL via OROMUCOSAL

## 2017-09-01 MED ORDER — CHLORHEXIDINE GLUCONATE 0.12% ORAL RINSE (MEDLINE KIT)
15.0000 mL | Freq: Two times a day (BID) | OROMUCOSAL | Status: DC
  Administered 2017-09-01: 15 mL via OROMUCOSAL

## 2017-09-01 MED ORDER — MILRINONE LACTATE IN DEXTROSE 20-5 MG/100ML-% IV SOLN
0.1500 ug/kg/min | INTRAVENOUS | Status: DC
  Administered 2017-09-01 – 2017-09-02 (×2): 0.3 ug/kg/min via INTRAVENOUS
  Administered 2017-09-02: 0.15 ug/kg/min via INTRAVENOUS
  Filled 2017-09-01 (×3): qty 100

## 2017-09-01 MED ORDER — NITROGLYCERIN IN D5W 200-5 MCG/ML-% IV SOLN: 0.0000 ug/min | INTRAVENOUS | Status: DC

## 2017-09-01 MED ORDER — SODIUM CHLORIDE 0.9 % IV SOLN
INTRAVENOUS | Status: DC
  Administered 2017-09-02: 2 [IU]/h via INTRAVENOUS
  Administered 2017-09-04: 1.7 [IU]/h via INTRAVENOUS
  Filled 2017-09-01 (×2): qty 1

## 2017-09-01 MED ORDER — PHENYLEPHRINE 40 MCG/ML (10ML) SYRINGE FOR IV PUSH (FOR BLOOD PRESSURE SUPPORT)
PREFILLED_SYRINGE | INTRAVENOUS | Status: DC | PRN
  Administered 2017-09-01: 80 ug via INTRAVENOUS
  Administered 2017-09-01: 160 ug via INTRAVENOUS

## 2017-09-01 MED ORDER — FENTANYL CITRATE (PF) 250 MCG/5ML IJ SOLN
INTRAMUSCULAR | Status: AC
  Filled 2017-09-01: qty 20

## 2017-09-01 MED ORDER — ALBUMIN HUMAN 5 % IV SOLN
250.0000 mL | INTRAVENOUS | Status: AC | PRN
  Administered 2017-09-01 – 2017-09-02 (×3): 250 mL via INTRAVENOUS
  Filled 2017-09-01: qty 250

## 2017-09-01 MED ORDER — CHLORHEXIDINE GLUCONATE 0.12 % MT SOLN
15.0000 mL | Freq: Once | OROMUCOSAL | Status: AC
  Administered 2017-09-01: 15 mL via OROMUCOSAL

## 2017-09-01 MED ORDER — SODIUM CHLORIDE 0.9 % IV SOLN
30.0000 ug/min | INTRAVENOUS | Status: DC
  Filled 2017-09-01: qty 2

## 2017-09-01 MED ORDER — SODIUM CHLORIDE 0.9 % IV SOLN
0.0400 [IU]/min | INTRAVENOUS | Status: DC
  Administered 2017-09-02 – 2017-09-05 (×4): 0.04 [IU]/min via INTRAVENOUS
  Filled 2017-09-01 (×5): qty 2

## 2017-09-01 MED ORDER — HEPARIN SODIUM (PORCINE) 1000 UNIT/ML IJ SOLN
INTRAMUSCULAR | Status: AC
  Filled 2017-09-01: qty 2

## 2017-09-01 MED ORDER — LACTATED RINGERS IV SOLN: 500.0000 mL | Freq: Once | INTRAVENOUS | Status: DC | PRN

## 2017-09-01 MED ORDER — METOPROLOL TARTRATE 25 MG/10 ML ORAL SUSPENSION: 12.5000 mg | Freq: Two times a day (BID) | ORAL | Status: DC

## 2017-09-01 MED ORDER — SODIUM CHLORIDE 0.9 % IV SOLN
INTRAVENOUS | Status: DC
  Administered 2017-09-01: 18:00:00 via INTRAVENOUS

## 2017-09-01 MED ORDER — NOREPINEPHRINE BITARTRATE 1 MG/ML IV SOLN
0.0000 ug/min | INTRAVENOUS | Status: DC
  Filled 2017-09-01: qty 4

## 2017-09-01 MED ORDER — IODIXANOL 320 MG/ML IV SOLN
INTRAVENOUS | Status: DC | PRN
  Administered 2017-09-01: 2 mL via INTRAVENOUS

## 2017-09-01 MED ORDER — ACETAMINOPHEN 160 MG/5ML PO SOLN
1000.0000 mg | Freq: Four times a day (QID) | ORAL | Status: DC
  Administered 2017-09-01 – 2017-09-03 (×6): 1000 mg
  Filled 2017-09-01 (×7): qty 40.6

## 2017-09-01 MED ORDER — SODIUM CHLORIDE 0.9% FLUSH
10.0000 mL | Freq: Two times a day (BID) | INTRAVENOUS | Status: DC
  Administered 2017-09-01 – 2017-09-03 (×4): 10 mL
  Administered 2017-09-04: 20 mL
  Administered 2017-09-04 – 2017-09-06 (×3): 10 mL
  Administered 2017-09-08: 20 mL
  Administered 2017-09-09 – 2017-09-10 (×3): 10 mL

## 2017-09-01 MED ORDER — PROTAMINE SULFATE 10 MG/ML IV SOLN
INTRAVENOUS | Status: AC
Start: 2017-09-01 — End: ?
  Filled 2017-09-01: qty 25

## 2017-09-01 MED ORDER — ROCURONIUM BROMIDE 10 MG/ML (PF) SYRINGE
PREFILLED_SYRINGE | INTRAVENOUS | Status: DC | PRN
  Administered 2017-09-01 (×2): 50 mg via INTRAVENOUS
  Administered 2017-09-01: 100 mg via INTRAVENOUS
  Administered 2017-09-01: 30 mg via INTRAVENOUS
  Administered 2017-09-01: 50 mg via INTRAVENOUS

## 2017-09-01 MED ORDER — MIDAZOLAM HCL 2 MG/2ML IJ SOLN
2.0000 mg | INTRAMUSCULAR | Status: DC | PRN
  Administered 2017-09-02: 2 mg via INTRAVENOUS
  Filled 2017-09-01: qty 2

## 2017-09-01 MED ORDER — TRAMADOL HCL 50 MG PO TABS
50.0000 mg | ORAL_TABLET | ORAL | Status: DC | PRN
  Administered 2017-09-06 – 2017-09-07 (×2): 50 mg via ORAL
  Filled 2017-09-01 (×3): qty 1

## 2017-09-01 MED ORDER — LACTATED RINGERS IV SOLN: INTRAVENOUS | Status: DC

## 2017-09-01 MED ORDER — SODIUM CHLORIDE 0.9 % IV SOLN
INTRAVENOUS | Status: AC
  Administered 2017-09-01: 17:00:00 via INTRAVENOUS

## 2017-09-01 MED ORDER — NOREPINEPHRINE BITARTRATE 1 MG/ML IV SOLN
0.0000 ug/min | INTRAVENOUS | Status: DC
  Administered 2017-09-01: 1 ug/min via INTRAVENOUS
  Filled 2017-09-01: qty 4

## 2017-09-01 MED ORDER — BISACODYL 10 MG RE SUPP
10.0000 mg | Freq: Every day | RECTAL | Status: DC
  Administered 2017-09-02: 10 mg via RECTAL
  Filled 2017-09-01: qty 1

## 2017-09-01 MED ORDER — PHENYLEPHRINE HCL 10 MG/ML IJ SOLN
0.0000 ug/min | INTRAMUSCULAR | Status: DC
  Filled 2017-09-01: qty 2

## 2017-09-01 MED ORDER — EPHEDRINE SULFATE 50 MG/ML IJ SOLN
INTRAMUSCULAR | Status: AC
  Filled 2017-09-01: qty 1

## 2017-09-01 MED ORDER — SODIUM CHLORIDE 0.9 % IV SOLN
INTRAVENOUS | Status: DC | PRN
  Administered 2017-09-01: 07:00:00 via INTRAVENOUS

## 2017-09-01 MED ORDER — ASPIRIN EC 325 MG PO TBEC: 325.0000 mg | DELAYED_RELEASE_TABLET | Freq: Every day | ORAL | Status: DC

## 2017-09-01 MED ORDER — CHLORHEXIDINE GLUCONATE CLOTH 2 % EX PADS
6.0000 | MEDICATED_PAD | Freq: Every day | CUTANEOUS | Status: DC
  Administered 2017-09-01 – 2017-09-10 (×9): 6 via TOPICAL

## 2017-09-01 MED ORDER — DEXMEDETOMIDINE HCL IN NACL 200 MCG/50ML IV SOLN
0.0000 ug/kg/h | INTRAVENOUS | Status: DC
  Administered 2017-09-01 (×2): 0.7 ug/kg/h via INTRAVENOUS
  Administered 2017-09-02: 0.5 ug/kg/h via INTRAVENOUS
  Administered 2017-09-02 (×2): 0.7 ug/kg/h via INTRAVENOUS
  Filled 2017-09-01 (×7): qty 50

## 2017-09-01 MED ORDER — THROMBIN 20000 UNITS EX SOLR
CUTANEOUS | Status: AC
  Filled 2017-09-01: qty 40000

## 2017-09-01 MED ORDER — MIDAZOLAM HCL 5 MG/5ML IJ SOLN
INTRAMUSCULAR | Status: DC | PRN
  Administered 2017-09-01 (×6): 2 mg via INTRAVENOUS

## 2017-09-01 MED ORDER — MORPHINE SULFATE (PF) 2 MG/ML IV SOLN
1.0000 mg | INTRAVENOUS | Status: DC | PRN
  Administered 2017-09-01 – 2017-09-04 (×9): 2 mg via INTRAVENOUS
  Filled 2017-09-01 (×10): qty 1

## 2017-09-01 MED ORDER — MIDAZOLAM HCL 10 MG/2ML IJ SOLN
INTRAMUSCULAR | Status: AC
  Filled 2017-09-01: qty 2

## 2017-09-01 MED ORDER — VANCOMYCIN HCL IN DEXTROSE 1-5 GM/200ML-% IV SOLN
1000.0000 mg | Freq: Once | INTRAVENOUS | Status: AC
  Administered 2017-09-01: 1000 mg via INTRAVENOUS
  Filled 2017-09-01: qty 200

## 2017-09-01 MED ORDER — SODIUM CHLORIDE 0.9 % IV SOLN
1.5000 g | Freq: Two times a day (BID) | INTRAVENOUS | Status: AC
  Administered 2017-09-01 – 2017-09-03 (×4): 1.5 g via INTRAVENOUS
  Filled 2017-09-01 (×4): qty 1.5

## 2017-09-01 MED ORDER — THROMBIN 5000 UNITS EX SOLR
CUTANEOUS | Status: AC
  Filled 2017-09-01: qty 5000

## 2017-09-01 MED ORDER — CHLORHEXIDINE GLUCONATE 0.12% ORAL RINSE (MEDLINE KIT)
15.0000 mL | Freq: Two times a day (BID) | OROMUCOSAL | Status: DC
  Administered 2017-09-02 – 2017-09-03 (×3): 15 mL via OROMUCOSAL

## 2017-09-01 MED ORDER — NOREPINEPHRINE BITARTRATE 1 MG/ML IV SOLN
0.0000 ug/min | INTRAVENOUS | Status: DC
  Administered 2017-09-01: 17 ug/min via INTRAVENOUS
  Administered 2017-09-02 (×2): 24 ug/min via INTRAVENOUS
  Administered 2017-09-03: 22 ug/min via INTRAVENOUS
  Administered 2017-09-04: 20 ug/min via INTRAVENOUS
  Filled 2017-09-01 (×7): qty 16

## 2017-09-01 MED ORDER — SODIUM CHLORIDE 0.9 % IV SOLN
INTRAVENOUS | Status: DC | PRN
  Administered 2017-09-01: 750 mg via INTRAVENOUS

## 2017-09-01 MED ORDER — FENTANYL CITRATE (PF) 250 MCG/5ML IJ SOLN
INTRAMUSCULAR | Status: AC
  Filled 2017-09-01: qty 5

## 2017-09-01 MED ORDER — ACETAMINOPHEN 650 MG RE SUPP
650.0000 mg | Freq: Once | RECTAL | Status: AC
  Administered 2017-09-01: 650 mg via RECTAL

## 2017-09-01 MED ORDER — DOCUSATE SODIUM 100 MG PO CAPS
200.0000 mg | ORAL_CAPSULE | Freq: Every day | ORAL | Status: DC
  Administered 2017-09-04 – 2017-09-08 (×4): 200 mg via ORAL
  Filled 2017-09-01 (×4): qty 2

## 2017-09-01 MED ORDER — SODIUM CHLORIDE 0.9% FLUSH
3.0000 mL | Freq: Two times a day (BID) | INTRAVENOUS | Status: DC
  Administered 2017-09-02 – 2017-09-10 (×12): 3 mL via INTRAVENOUS

## 2017-09-01 MED ORDER — PROPOFOL 10 MG/ML IV BOLUS
INTRAVENOUS | Status: DC | PRN
  Administered 2017-09-01: 30 mg via INTRAVENOUS

## 2017-09-01 MED ORDER — HEPARIN SODIUM (PORCINE) 1000 UNIT/ML IJ SOLN
INTRAMUSCULAR | Status: DC | PRN
  Administered 2017-09-01: 2800 [IU] via INTRAVENOUS

## 2017-09-01 MED ORDER — FENTANYL CITRATE (PF) 250 MCG/5ML IJ SOLN
INTRAMUSCULAR | Status: DC | PRN
  Administered 2017-09-01: 250 ug via INTRAVENOUS
  Administered 2017-09-01: 100 ug via INTRAVENOUS
  Administered 2017-09-01: 300 ug via INTRAVENOUS
  Administered 2017-09-01 (×2): 100 ug via INTRAVENOUS
  Administered 2017-09-01: 250 ug via INTRAVENOUS
  Administered 2017-09-01: 150 ug via INTRAVENOUS

## 2017-09-01 MED ORDER — METOPROLOL TARTRATE 12.5 MG HALF TABLET: 12.5000 mg | ORAL_TABLET | Freq: Two times a day (BID) | ORAL | Status: DC

## 2017-09-01 MED ORDER — SODIUM CHLORIDE 0.9 % IV SOLN
INTRAVENOUS | Status: DC | PRN
  Administered 2017-09-01: 10:00:00 via INTRAVENOUS

## 2017-09-01 MED ORDER — CHLORHEXIDINE GLUCONATE 4 % EX LIQD: 30.0000 mL | CUTANEOUS | Status: DC

## 2017-09-01 MED ORDER — SODIUM CHLORIDE 0.9% FLUSH: 3.0000 mL | INTRAVENOUS | Status: DC | PRN

## 2017-09-01 MED ORDER — SODIUM CHLORIDE 0.9 % IR SOLN
Status: DC | PRN
  Administered 2017-09-01: 5000 mL

## 2017-09-01 MED ORDER — CHLORHEXIDINE GLUCONATE 0.12 % MT SOLN: 15.0000 mL | OROMUCOSAL | Status: AC

## 2017-09-01 MED ORDER — THROMBIN 20000 UNITS EX SOLR
CUTANEOUS | Status: DC | PRN
  Administered 2017-09-01: 20000 [IU] via TOPICAL

## 2017-09-01 MED ORDER — BISACODYL 5 MG PO TBEC
10.0000 mg | DELAYED_RELEASE_TABLET | Freq: Every day | ORAL | Status: DC
  Administered 2017-09-04 – 2017-09-08 (×4): 10 mg via ORAL
  Filled 2017-09-01 (×4): qty 2

## 2017-09-01 MED ORDER — THROMBIN 20000 UNITS EX SOLR
CUTANEOUS | Status: AC
  Filled 2017-09-01: qty 20000

## 2017-09-01 MED ORDER — DEXMEDETOMIDINE HCL IN NACL 200 MCG/50ML IV SOLN
INTRAVENOUS | Status: AC
  Filled 2017-09-01: qty 50

## 2017-09-01 MED ORDER — HEMOSTATIC AGENTS (NO CHARGE) OPTIME
TOPICAL | Status: DC | PRN
  Administered 2017-09-01 (×5): 1 via TOPICAL

## 2017-09-01 MED ORDER — SODIUM CHLORIDE 0.9 % IV SOLN
250.0000 mL | INTRAVENOUS | Status: DC
  Administered 2017-09-02: 250 mL via INTRAVENOUS

## 2017-09-01 MED ORDER — PROTAMINE SULFATE 10 MG/ML IV SOLN
INTRAVENOUS | Status: DC | PRN
  Administered 2017-09-01: 90 mg via INTRAVENOUS
  Administered 2017-09-01: 20 mg via INTRAVENOUS
  Administered 2017-09-01: 50 mg via INTRAVENOUS
  Administered 2017-09-01: 40 mg via INTRAVENOUS
  Administered 2017-09-01: 80 mg via INTRAVENOUS
  Administered 2017-09-01: 30 mg via INTRAVENOUS
  Administered 2017-09-01: 50 mg via INTRAVENOUS
  Administered 2017-09-01: 20 mg via INTRAVENOUS
  Administered 2017-09-01: 50 mg via INTRAVENOUS

## 2017-09-01 MED ORDER — ONDANSETRON HCL 4 MG/2ML IJ SOLN: 4.0000 mg | Freq: Four times a day (QID) | INTRAMUSCULAR | Status: DC | PRN

## 2017-09-01 MED ORDER — OXYCODONE HCL 5 MG PO TABS
5.0000 mg | ORAL_TABLET | ORAL | Status: DC | PRN
  Administered 2017-09-03 – 2017-09-04 (×3): 10 mg via ORAL
  Administered 2017-09-08: 5 mg via ORAL
  Filled 2017-09-01 (×3): qty 2
  Filled 2017-09-01: qty 1

## 2017-09-01 MED ORDER — METOPROLOL TARTRATE 12.5 MG HALF TABLET: 12.5000 mg | ORAL_TABLET | Freq: Once | ORAL | Status: DC

## 2017-09-01 MED ORDER — ACETAMINOPHEN 500 MG PO TABS
1000.0000 mg | ORAL_TABLET | Freq: Four times a day (QID) | ORAL | Status: DC
  Administered 2017-09-03 – 2017-09-04 (×3): 1000 mg via ORAL
  Filled 2017-09-01 (×4): qty 2

## 2017-09-01 MED ORDER — ARTIFICIAL TEARS OPHTHALMIC OINT
TOPICAL_OINTMENT | OPHTHALMIC | Status: DC | PRN
  Administered 2017-09-01: 1 via OPHTHALMIC

## 2017-09-01 MED ORDER — THROMBIN 5000 UNITS EX SOLR
CUTANEOUS | Status: DC | PRN
  Administered 2017-09-01: 5000 [IU] via TOPICAL

## 2017-09-01 MED ORDER — PHENYLEPHRINE 40 MCG/ML (10ML) SYRINGE FOR IV PUSH (FOR BLOOD PRESSURE SUPPORT)
PREFILLED_SYRINGE | INTRAVENOUS | Status: AC
  Filled 2017-09-01: qty 20

## 2017-09-01 MED ORDER — PROPOFOL 10 MG/ML IV BOLUS
INTRAVENOUS | Status: AC
  Filled 2017-09-01: qty 20

## 2017-09-01 MED ORDER — ORAL CARE MOUTH RINSE: 15.0000 mL | OROMUCOSAL | Status: DC

## 2017-09-01 MED ORDER — HEPARIN SODIUM (PORCINE) 1000 UNIT/ML IJ SOLN
INTRAMUSCULAR | Status: DC | PRN
  Administered 2017-09-01: 45000 [IU] via INTRAVENOUS

## 2017-09-01 MED ORDER — ASPIRIN 81 MG PO CHEW
324.0000 mg | CHEWABLE_TABLET | Freq: Every day | ORAL | Status: DC
  Administered 2017-09-02 – 2017-09-03 (×2): 324 mg
  Filled 2017-09-01 (×2): qty 4

## 2017-09-01 SURGICAL SUPPLY — 158 items
ADAPTER CARDIO PERF ANTE/RETRO (ADAPTER) ×5 IMPLANT
ADPR PRFSN 84XANTGRD RTRGD (ADAPTER) ×4
APL SWBSTK 6 STRL LF DISP (MISCELLANEOUS) ×4
APPLICATOR COTTON TIP 6 STRL (MISCELLANEOUS) ×4 IMPLANT
APPLICATOR COTTON TIP 6IN STRL (MISCELLANEOUS) ×5
BAG DECANTER FOR FLEXI CONT (MISCELLANEOUS) ×10 IMPLANT
BANDAGE ACE 4X5 VEL STRL LF (GAUZE/BANDAGES/DRESSINGS) ×5 IMPLANT
BANDAGE ACE 6X5 VEL STRL LF (GAUZE/BANDAGES/DRESSINGS) ×5 IMPLANT
BASKET HEART (ORDER IN 25'S) (MISCELLANEOUS) ×1
BASKET HEART (ORDER IN 25S) (MISCELLANEOUS) ×4 IMPLANT
BLADE CLIPPER SURG (BLADE) IMPLANT
BLADE NEEDLE 3 SS STRL (BLADE) ×5 IMPLANT
BLADE STERNUM SYSTEM 6 (BLADE) ×5 IMPLANT
BLADE SURG 11 STRL SS (BLADE) ×5 IMPLANT
BNDG GAUZE ELAST 4 BULKY (GAUZE/BANDAGES/DRESSINGS) ×5 IMPLANT
CANISTER SUCT 3000ML PPV (MISCELLANEOUS) ×5 IMPLANT
CANNULA EZ GLIDE 8.0 24FR (CANNULA) ×5 IMPLANT
CANNULA EZ GLIDE AORTIC 21FR (CANNULA) ×5 IMPLANT
CANNULA FEM VENOUS REMOTE 22FR (CANNULA) ×5 IMPLANT
CANNULA GUNDRY RCSP 15FR (MISCELLANEOUS) ×5 IMPLANT
CANNULA SOFTFLOW AORTIC 7M21FR (CANNULA) ×5 IMPLANT
CANNULA VRC MALB SNGL STG 28FR (MISCELLANEOUS) ×4 IMPLANT
CATH CPB KIT OWEN (MISCELLANEOUS) ×5 IMPLANT
CATH HEART VENT LEFT (CATHETERS) ×4 IMPLANT
CATH ROBINSON RED A/P 18FR (CATHETERS) ×5 IMPLANT
CATH THORACIC 36FR (CATHETERS) ×5 IMPLANT
CATH THORACIC 36FR RT ANG (CATHETERS) ×5 IMPLANT
CATH TRIALYSIS 20CM 13F 3LUM (CATHETERS) ×5 IMPLANT
CLIP VESOCCLUDE MED 24/CT (CLIP) IMPLANT
CLIP VESOCCLUDE SM WIDE 24/CT (CLIP) IMPLANT
CONN 3/8X1/2 ST GISH (MISCELLANEOUS) ×5 IMPLANT
CONT SPEC 4OZ CLIKSEAL STRL BL (MISCELLANEOUS) ×5 IMPLANT
COVER PROBE W GEL 5X96 (DRAPES) ×5 IMPLANT
COVER SURGICAL LIGHT HANDLE (MISCELLANEOUS) ×5 IMPLANT
CRADLE DONUT ADULT HEAD (MISCELLANEOUS) ×5 IMPLANT
DEVICE SUT CK QUICK LOAD MINI (Prosthesis & Implant Heart) ×10 IMPLANT
DRAIN CHANNEL 32F RND 10.7 FF (WOUND CARE) ×10 IMPLANT
DRAPE BILATERAL SPLIT (DRAPES) IMPLANT
DRAPE CARDIOVASCULAR INCISE (DRAPES) ×5
DRAPE CV SPLIT W-CLR ANES SCRN (DRAPES) ×5 IMPLANT
DRAPE INCISE IOBAN 66X45 STRL (DRAPES) ×20 IMPLANT
DRAPE PERI GROIN 82X75IN TIB (DRAPES) ×5 IMPLANT
DRAPE SLUSH/WARMER DISC (DRAPES) ×5 IMPLANT
DRAPE SRG 135X102X78XABS (DRAPES) ×4 IMPLANT
DRSG AQUACEL AG ADV 3.5X14 (GAUZE/BANDAGES/DRESSINGS) ×5 IMPLANT
DRSG COVADERM 4X14 (GAUZE/BANDAGES/DRESSINGS) ×5 IMPLANT
ELECT BLADE 4.0 EZ CLEAN MEGAD (MISCELLANEOUS) ×5
ELECT REM PT RETURN 9FT ADLT (ELECTROSURGICAL) ×10
ELECTRODE BLDE 4.0 EZ CLN MEGD (MISCELLANEOUS) ×4 IMPLANT
ELECTRODE REM PT RTRN 9FT ADLT (ELECTROSURGICAL) ×8 IMPLANT
FELT TEFLON 1X6 (MISCELLANEOUS) ×5 IMPLANT
GAUZE SPONGE 4X4 12PLY STRL (GAUZE/BANDAGES/DRESSINGS) ×10 IMPLANT
GAUZE SPONGE 4X4 12PLY STRL LF (GAUZE/BANDAGES/DRESSINGS) ×10 IMPLANT
GEL ULTRASOUND 20GR AQUASONIC (MISCELLANEOUS) ×5 IMPLANT
GLOVE BIO SURGEON STRL SZ 6 (GLOVE) IMPLANT
GLOVE BIO SURGEON STRL SZ 6.5 (GLOVE) ×15 IMPLANT
GLOVE BIO SURGEON STRL SZ7 (GLOVE) IMPLANT
GLOVE BIO SURGEON STRL SZ7.5 (GLOVE) IMPLANT
GLOVE BIOGEL PI IND STRL 8 (GLOVE) ×12 IMPLANT
GLOVE BIOGEL PI INDICATOR 8 (GLOVE) ×3
GLOVE ECLIPSE 8.0 STRL XLNG CF (GLOVE) ×15 IMPLANT
GLOVE ORTHO TXT STRL SZ7.5 (GLOVE) ×15 IMPLANT
GOWN STRL REUS W/ TWL LRG LVL3 (GOWN DISPOSABLE) ×16 IMPLANT
GOWN STRL REUS W/TWL 2XL LVL3 (GOWN DISPOSABLE) ×15 IMPLANT
GOWN STRL REUS W/TWL LRG LVL3 (GOWN DISPOSABLE) ×20
INSERT FOGARTY XLG (MISCELLANEOUS) ×5 IMPLANT
KIT BASIN OR (CUSTOM PROCEDURE TRAY) ×5 IMPLANT
KIT DILATOR VASC 18G NDL (KITS) ×5 IMPLANT
KIT DRAINAGE VACCUM ASSIST (KITS) ×5 IMPLANT
KIT SUCTION CATH 14FR (SUCTIONS) ×15 IMPLANT
KIT SUT CK MINI COMBO 4X17 (Prosthesis & Implant Heart) ×5 IMPLANT
KIT TURNOVER KIT B (KITS) ×5 IMPLANT
KIT VASOVIEW HEMOPRO VH 3000 (KITS) ×5 IMPLANT
LEAD PACING MYOCARDI (MISCELLANEOUS) ×5 IMPLANT
LINE VENT (MISCELLANEOUS) ×5 IMPLANT
MARKER GRAFT CORONARY BYPASS (MISCELLANEOUS) ×5 IMPLANT
NDL SUT 1 .5 CRC FRENCH EYE (NEEDLE) ×4 IMPLANT
NEEDLE FRENCH EYE (NEEDLE) ×5
NEEDLE HYPO 25GX1X1/2 BEV (NEEDLE) ×5 IMPLANT
NS IRRIG 1000ML POUR BTL (IV SOLUTION) ×30 IMPLANT
PACK E OPEN HEART (SUTURE) ×5 IMPLANT
PACK OPEN HEART (CUSTOM PROCEDURE TRAY) ×5 IMPLANT
PAD ARMBOARD 7.5X6 YLW CONV (MISCELLANEOUS) ×10 IMPLANT
PAD ELECT DEFIB RADIOL ZOLL (MISCELLANEOUS) ×5 IMPLANT
PENCIL BUTTON HOLSTER BLD 10FT (ELECTRODE) ×5 IMPLANT
PUNCH AORTIC ROTATE  4.5MM 8IN (MISCELLANEOUS) ×5 IMPLANT
PUNCH AORTIC ROTATE 4.0MM (MISCELLANEOUS) IMPLANT
PUNCH AORTIC ROTATE 4.5MM 8IN (MISCELLANEOUS) IMPLANT
PUNCH AORTIC ROTATE 5MM 8IN (MISCELLANEOUS) IMPLANT
SENSOR MYOCARDIAL TEMP (MISCELLANEOUS) ×5 IMPLANT
SET CARDIOPLEGIA MPS 5001102 (MISCELLANEOUS) ×5 IMPLANT
SET IRRIG TUBING LAPAROSCOPIC (IRRIGATION / IRRIGATOR) ×5 IMPLANT
SHEATH BRITE TIP 8FR 35CM (SHEATH) ×5 IMPLANT
SOLUTION ANTI FOG 6CC (MISCELLANEOUS) ×10 IMPLANT
SPONGE LAP 18X18 X RAY DECT (DISPOSABLE) ×10 IMPLANT
SPONGE LAP 4X18 X RAY DECT (DISPOSABLE) IMPLANT
STOPCOCK 4 WAY LG BORE MALE ST (IV SETS) ×5 IMPLANT
SUT BONE WAX W31G (SUTURE) ×5 IMPLANT
SUT ETHIBON 2 0 V 52N 30 (SUTURE) ×15 IMPLANT
SUT ETHIBON EXCEL 2-0 V-5 (SUTURE) IMPLANT
SUT ETHIBOND 2 0 SH (SUTURE) ×5
SUT ETHIBOND 2 0 SH 36X2 (SUTURE) ×4 IMPLANT
SUT ETHIBOND 2 0 V4 (SUTURE) IMPLANT
SUT ETHIBOND 2 0V4 GREEN (SUTURE) IMPLANT
SUT ETHIBOND 4 0 RB 1 (SUTURE) IMPLANT
SUT ETHIBOND V-5 VALVE (SUTURE) IMPLANT
SUT ETHIBOND X763 2 0 SH 1 (SUTURE) ×25 IMPLANT
SUT MNCRL AB 4-0 PS2 18 (SUTURE) ×10 IMPLANT
SUT PDS AB 1 CTX 36 (SUTURE) ×10 IMPLANT
SUT PROLENE 2 0 SH DA (SUTURE) IMPLANT
SUT PROLENE 3 0 SH DA (SUTURE) ×35 IMPLANT
SUT PROLENE 3 0 SH1 36 (SUTURE) ×30 IMPLANT
SUT PROLENE 4 0 RB 1 (SUTURE) ×35
SUT PROLENE 4 0 SH DA (SUTURE) ×10 IMPLANT
SUT PROLENE 4-0 RB1 .5 CRCL 36 (SUTURE) ×28 IMPLANT
SUT PROLENE 5 0 C 1 36 (SUTURE) IMPLANT
SUT PROLENE 6 0 C 1 30 (SUTURE) IMPLANT
SUT PROLENE 7.0 RB 3 (SUTURE) ×10 IMPLANT
SUT PROLENE 8 0 BV175 6 (SUTURE) IMPLANT
SUT PROLENE BLUE 7 0 (SUTURE) ×5 IMPLANT
SUT PROLENE POLY MONO (SUTURE) IMPLANT
SUT SILK  1 MH (SUTURE) ×4
SUT SILK 1 MH (SUTURE) ×16 IMPLANT
SUT SILK 2 0 SH CR/8 (SUTURE) ×5 IMPLANT
SUT SILK 3 0 SH CR/8 (SUTURE) IMPLANT
SUT STEEL 6MS V (SUTURE) IMPLANT
SUT STEEL STERNAL CCS#1 18IN (SUTURE) IMPLANT
SUT STEEL SZ 6 DBL 3X14 BALL (SUTURE) IMPLANT
SUT VIC AB 1 CTX 36 (SUTURE)
SUT VIC AB 1 CTX36XBRD ANBCTR (SUTURE) IMPLANT
SUT VIC AB 2-0 CT1 27 (SUTURE) ×5
SUT VIC AB 2-0 CT1 TAPERPNT 27 (SUTURE) ×4 IMPLANT
SUT VIC AB 2-0 CTX 27 (SUTURE) IMPLANT
SUT VIC AB 3-0 SH 27 (SUTURE)
SUT VIC AB 3-0 SH 27X BRD (SUTURE) IMPLANT
SUT VIC AB 3-0 X1 27 (SUTURE) IMPLANT
SUT VICRYL 4-0 PS2 18IN ABS (SUTURE) IMPLANT
SWAB CULTURE ESWAB REG 1ML (MISCELLANEOUS) ×5 IMPLANT
SYR 10ML LL (SYRINGE) ×5 IMPLANT
SYR 20CC LL (SYRINGE) ×5 IMPLANT
SYR 20ML ECCENTRIC (SYRINGE) ×5 IMPLANT
SYSTEM SAHARA CHEST DRAIN ATS (WOUND CARE) ×5 IMPLANT
TAPE CLOTH SURG 4X10 WHT LF (GAUZE/BANDAGES/DRESSINGS) ×10 IMPLANT
TAPE PAPER 2X10 WHT MICROPORE (GAUZE/BANDAGES/DRESSINGS) ×5 IMPLANT
TOWEL GREEN STERILE (TOWEL DISPOSABLE) ×5 IMPLANT
TOWEL GREEN STERILE FF (TOWEL DISPOSABLE) ×5 IMPLANT
TRAY CATH LUMEN 1 20CM STRL (SET/KITS/TRAYS/PACK) ×5 IMPLANT
TRAY FOLEY SILVER 16FR TEMP (SET/KITS/TRAYS/PACK) ×5 IMPLANT
TUBING ART PRESS 48 MALE/FEM (TUBING) ×20 IMPLANT
TUBING EXTENTION W/L.L. (IV SETS) ×5 IMPLANT
TUBING INSUFFLATION (TUBING) ×5 IMPLANT
UNDERPAD 30X30 (UNDERPADS AND DIAPERS) ×5 IMPLANT
VALVE AORTIC TOP HAT (Prosthesis & Implant Heart) ×5 IMPLANT
VALVE AORTIC TOP HAT 25 (Prosthesis & Implant Heart) ×4 IMPLANT
VENT LEFT HEART 12002 (CATHETERS) ×5
VRC MALLEABLE SINGLE STG 28FR (MISCELLANEOUS) ×5
WATER STERILE IRR 1000ML POUR (IV SOLUTION) ×10 IMPLANT
WIRE EMERALD 3MM-J .035X150CM (WIRE) ×10 IMPLANT

## 2017-09-01 NOTE — Progress Notes (Signed)
Patient ID: [name] [name], male   DOB: 03-09-1966, 52 y.o.   MRN: [id number] EVENING ROUNDS NOTE :     [address]       [place] [address]             [phone]                 Day of Surgery Procedure(s) (LRB): AORTIC VALVE REPLACEMENT (AVR) (N/A) CORONARY ARTERY BYPASS GRAFTING (CABG) X 1, USING RIGHT GREATER SAPHENOUS VEIN HARVESTED ENDOSCOPICALLY (N/A) TRANSESOPHAGEAL ECHOCARDIOGRAM (TEE) (N/A) FLOROSCOPY GUIDED PLACEMENT OF RIGHT FEMORAL CENTRAL LINE TIMES TWO  AND PLACEMENT OF SWAN GANZ CATHETER (Right) PLACEMENT OF TRIALYSIS SHORT TERM DIALYSIS CATHETER  Total Length of Stay:  LOS: 0 days  BP (!) 84/59   Pulse (!) 102   Temp (!) 96.3 F (35.7 C)   Resp (!) 28   Ht 6' (1.829 m)   Wt (!) 308 lb (139.7 kg)   SpO2 100%   BMI 41.77 kg/m   .Intake/Output      05/16 0701 - 05/17 0700 05/17 0701 - 05/18 0700   I.V. (mL/kg)  2000 (14.3)   Blood  2752   Total Intake(mL/kg)  4752 (34)   Urine (mL/kg/hr)  3 (0)   Blood  1140   Total Output  1143   Net  +3609          . sodium chloride 20 mL/hr at 09/01/17 1702  . sodium chloride 100 mL/hr at 09/01/17 1702  . [START ON 09/02/2017] sodium chloride    . sodium chloride 10 mL/hr at 09/01/17 1810  . albumin human    . cefUROXime (ZINACEF)  IV    . dexmedetomidine (PRECEDEX) IV infusion    . famotidine (PEPCID) IV 20 mg (09/01/17 1802)  . insulin (NOVOLIN-R) infusion 1.7 Units/hr (09/01/17 1720)  . lactated ringers    . lactated ringers    . lactated ringers 10 mL/hr at 09/01/17 1650  . milrinone    . nitroGLYCERIN    . norepinephrine (LEVOPHED) Adult infusion    . phenylephrine (NEO-SYNEPHRINE) Adult infusion Stopped (09/01/17 1650)  . vancomycin    . vasopressin (PITRESSIN) infusion - *FOR SHOCK*       Lab Results  Component Value Date   WBC 6.8 09/01/2017   HGB 10.2 (L) 09/01/2017   HCT 30.0 (L) 09/01/2017   PLT 294 09/01/2017   GLUCOSE 123 (H) 09/01/2017   CHOL 169 05/16/2013   TRIG 97  05/16/2013   HDL 35 (L) 05/16/2013   LDLCALC 115 (H) 05/16/2013   ALT 9 (L) 08/21/2017   AST 15 08/21/2017   NA 142 09/01/2017   K 4.2 09/01/2017   CL 107 09/01/2017   CREATININE 7.20 (H) 09/01/2017   BUN 26 (H) 09/01/2017   CO2 28 09/01/2017   TSH 1.66 05/16/2013   INR 1.13 08/21/2017   HGBA1C 5.0 08/21/2017    Early post op Not bleeding On vasopressin and levophed milrinone ci 2.01  Grace Isaac MD  Beeper [phone] Office [phone] 09/01/2017 6:11 PM

## 2017-09-01 NOTE — Transfer of Care (Signed)
Immediate Anesthesia Transfer of Care Note  Patient: [name] [name]  Procedure(s) Performed: AORTIC VALVE REPLACEMENT (AVR) (N/A Chest) CORONARY ARTERY BYPASS GRAFTING (CABG) X 1, USING RIGHT GREATER SAPHENOUS VEIN HARVESTED ENDOSCOPICALLY (N/A Chest) TRANSESOPHAGEAL ECHOCARDIOGRAM (TEE) (N/A ) FLOROSCOPY GUIDED PLACEMENT OF RIGHT FEMORAL CENTRAL LINE TIMES TWO  AND PLACEMENT OF SWAN GANZ CATHETER (Right Groin) PLACEMENT OF TRIALYSIS SHORT TERM DIALYSIS CATHETER  Patient Location: ICU  Anesthesia Type:General  Level of Consciousness: Patient remains intubated per anesthesia plan  Airway & Oxygen Therapy: Patient remains intubated per anesthesia plan and Patient placed on Ventilator (see vital sign flow sheet for setting)  Post-op Assessment: Report given to RN  Post vital signs: Reviewed and stable  Last Vitals:  Vitals Value Taken Time  BP 90/67 09/01/2017  5:09 PM  Temp    Pulse 103 09/01/2017  5:11 PM  Resp 11 09/01/2017  5:11 PM  SpO2 100 % 09/01/2017  5:11 PM  Vitals shown include unvalidated device data.  Last Pain:  Vitals:   09/01/17 0611  TempSrc:   PainSc: 0-No pain         Complications: No apparent anesthesia complications

## 2017-09-01 NOTE — Anesthesia Procedure Notes (Signed)
Arterial Line Insertion Start/End5/17/2019 7:00 AM, 09/01/2017 7:10 AM Performed by: Barrington Ellison, CRNA, CRNA  Lidocaine 1% used for infiltration and patient sedated Right, radial was placed Catheter size: 20 G Hand hygiene performed  and maximum sterile barriers used  Allen's test indicative of satisfactory collateral circulation Attempts: 2 Procedure performed without using ultrasound guided technique. Following insertion, dressing applied and Biopatch. Post procedure assessment: normal  Patient tolerated the procedure well with no immediate complications.

## 2017-09-01 NOTE — Interval H&P Note (Signed)
History and Physical Interval Note:  09/01/2017 6:49 AM  [name] [name]  has presented today for surgery, with the diagnosis of SEVERE AI CAD  The various methods of treatment have been discussed with the patient and family. After consideration of risks, benefits and other options for treatment, the patient has consented to  Procedure(s): AORTIC VALVE REPLACEMENT (AVR) (N/A) CORONARY ARTERY BYPASS GRAFTING (CABG) (N/A) TRANSESOPHAGEAL ECHOCARDIOGRAM (TEE) (N/A) as a surgical intervention .  The patient's history has been reviewed, patient examined, no change in status, stable for surgery.  I have reviewed the patient's chart and labs.  Questions were answered to the patient's satisfaction.     [name]

## 2017-09-01 NOTE — Anesthesia Preprocedure Evaluation (Addendum)
Anesthesia Evaluation  Patient identified by MRN, date of birth, ID band Patient awake    Reviewed: Allergy & Precautions, NPO status , Patient's Chart, lab work & pertinent test results, reviewed documented beta blocker date and time   History of Anesthesia Complications Negative for: history of anesthetic complications  Airway Mallampati: II  TM Distance: >3 FB Neck ROM: Full    Dental  (+) Teeth Intact, Missing, Chipped, Dental Advisory Given, Poor Dentition,    Pulmonary former smoker,    breath sounds clear to auscultation       Cardiovascular hypertension, + angina + CAD and +CHF  + dysrhythmias + Valvular Problems/Murmurs AI  Rhythm:Regular     Neuro/Psych    GI/Hepatic   Endo/Other  Morbid obesity  Renal/GU ESRF and DialysisRenal disease     Musculoskeletal   Abdominal   Peds  Hematology   Anesthesia Other Findings   Reproductive/Obstetrics                            Anesthesia Physical Anesthesia Plan  ASA: IV  Anesthesia Plan: General   Post-op Pain Management:    Induction: Intravenous  PONV Risk Score and Plan: 2 and Ondansetron and Dexamethasone  Airway Management Planned: Oral ETT  Additional Equipment: Arterial line, CVP, PA Cath, TEE, Ultrasound Guidance Line Placement and None  Intra-op Plan:   Post-operative Plan: Post-operative intubation/ventilation  Informed Consent: I have reviewed the patients History and Physical, chart, labs and discussed the procedure including the risks, benefits and alternatives for the proposed anesthesia with the patient or authorized representative who has indicated his/her understanding and acceptance.   Dental advisory given  Plan Discussed with: CRNA and Surgeon  Anesthesia Plan Comments:         Anesthesia Quick Evaluation

## 2017-09-01 NOTE — Brief Op Note (Signed)
09/01/2017  4:15 PM  PATIENT:  [name] [name]  52 y.o. male  PRE-OPERATIVE DIAGNOSIS:  SEVERE AI CAD  POST-OPERATIVE DIAGNOSIS:  SEVERE AI CAD  PROCEDURE:  Procedure(s): AORTIC VALVE REPLACEMENT (AVR) (N/A) CORONARY ARTERY BYPASS GRAFTING (CABG) X 1, USING RIGHT GREATER SAPHENOUS VEIN HARVESTED ENDOSCOPICALLY (N/A) TRANSESOPHAGEAL ECHOCARDIOGRAM (TEE) (N/A) FLOROSCOPY GUIDED PLACEMENT OF RIGHT FEMORAL CENTRAL LINE TIMES TWO  AND PLACEMENT OF SWAN GANZ CATHETER (Right) PLACEMENT OF TRIALYSIS SHORT TERM DIALYSIS CATHETER SVG-OM  SURGEON:  Surgeon(s) and Role:    [name], MD - Primary  PHYSICIAN ASSISTANT: WAYNE GOLD PA-C  ANESTHESIA:   general  EBL:    BLOOD ADMINISTERED:2 FFP  DRAINS: PLEURAL AND PERICARDIAL CHEST DRAINS   LOCAL MEDICATIONS USED:  NONE  SPECIMEN:  Source of Specimen:  AORTIC VALVE LEAFLETS  DISPOSITION OF SPECIMEN:  PATHOLOGY  COUNTS:  YES  TOURNIQUET:  * No tourniquets in log *  DICTATION: .Dragon Dictation  PLAN OF CARE: Admit to inpatient   PATIENT DISPOSITION:  ICU - intubated and hemodynamically stable.   Delay start of Pharmacological VTE agent (>24hrs) due to surgical blood loss or risk of bleeding: yes

## 2017-09-01 NOTE — Anesthesia Procedure Notes (Signed)
Procedure Name: Intubation Date/Time: 09/01/2017 8:02 AM Performed by: Barrington Ellison, CRNA Pre-anesthesia Checklist: Patient identified, Emergency Drugs available, Suction available and Patient being monitored Patient Re-evaluated:Patient Re-evaluated prior to induction Oxygen Delivery Method: Circle System Utilized Preoxygenation: Pre-oxygenation with 100% oxygen Induction Type: IV induction Ventilation: Mask ventilation without difficulty Laryngoscope Size: Mac and 4 Grade View: Grade I Tube type: Oral Tube size: 8.5 mm Number of attempts: 1 Airway Equipment and Method: Stylet and Oral airway Placement Confirmation: ETT inserted through vocal cords under direct vision,  positive ETCO2 and breath sounds checked- equal and bilateral Secured at: 23 cm Tube secured with: Tape Dental Injury: Teeth and Oropharynx as per pre-operative assessment

## 2017-09-01 NOTE — Progress Notes (Signed)
  Echocardiogram Echocardiogram Transesophageal has been performed.  [name] [name] F 09/01/2017, 10:45 AM

## 2017-09-01 NOTE — Progress Notes (Signed)
No thrill felt on left fistula grafts (upper and forearm) when palpated. Left fistula forearm does have faint pulsating flow when palpated. Will continue to monitor.

## 2017-09-01 NOTE — Op Note (Addendum)
CARDIOTHORACIC SURGERY OPERATIVE NOTE  Date of Procedure:  09/01/2017  Preoperative Diagnosis:   Severe Aortic Insufficiency  Severe Single-vessel Coronary Artery Disease  Postoperative Diagnosis: Same  Procedure:    Aortic Valve Replacement  Sorin [employer] Top Hat bileaflet mechanical valve (size 76mm, catalog # [id number], serial # [id number])   Coronary Artery Bypass Grafting x 1  Reversed Greater Saphenous Vein Graft to Obtuse Marginal Branch of Left Circumflex Coronary Artery  Endoscopic Vein Harvest from Right Thigh   Ultrasound and flouroscopic placement of right femoral venous lines and Swan-Ganz pulmonary artery catheter  6Fr triple lumen catheter  8Fr introducing sheath   Placement of left femoral venous temporary dialysis catheter    Surgeon: Valentina Gu. 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  Intraoperative Gram stain w/ no organisms seen  Severe aortic insufficiency  Dilated left ventricle with severe LV systolic dysfunction  Good quality saphenous vein conduit  Poor quality target vessels for grafting with diffuse calcification of all epicardial coronary arteries             BRIEF CLINICAL NOTE AND INDICATIONS FOR SURGERY  Patient is a 52 year old morbidly obese African-American male with history of hypertension, chronic diastolic congestive heart failure, and 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    The patient was originally seen in consultation during his recent hospitalization for acute exacerbation of chronic combined systolic and diastolic congestive heart failure.  He has long-standing history  of severe hypertension and approximately 20 years ago he began to develop symptoms of chronic diastolic congestive heart failure in the setting of progressive renal failure. He ultimately became dialysis dependent approximately 17 years ago. An echocardiogram performed 12 years ago revealed normal left ventricular systolic function with no mention of any significant valvular disease. Recently the patient developed persistent dry nonproductive cough and orthopnea. He was referred for cardiology consultation and evaluated by Dr. Bronson Ing 2 weeks ago. Transthoracic echocardiogram performed April 29, 2017 revealed severe left ventricular systolic dysfunction with ejection fraction estimated 20-25%. There was at least mild to moderate aortic insufficiency and question of possible vegetation beneath the aortic valve in the left ventricular outflow tract. There was mild mitral regurgitation. The patient was contacted via telephone and brought in for hospital admission for further workup and therapy. Multiple sets of blood cultures were obtained and remain no growth to date. Transesophageal echocardiogram performed July 31, 2017 revealed severe aortic insufficiency with no clear evidence of vegetation. There was moderate mitral regurgitation. There was severe left ventricular systolic dysfunction with ejection fraction estimated 25-30%. There was significant left ventricular hypertrophy. Left atrium was dilated. Cardiothoracic surgical consultation was requested.  The patient subsequently underwent left and right heart catheterization on August 02, 2017.  He was found to have multivessel coronary artery disease including high-grade proximal stenosis of the left circumflex coronary artery.  There was moderate pulmonary hypertension with PA pressures measured 53/24 with mean pulmonary capillary wedge pressure 26 mmHg and central venous pressure 11 mmHg.  There were no large V waves seen on wedge tracing.   Forward cardiac output was preserved with mixed venous oxygen saturation 61% at baseline.  The patient subsequently was seen in consultation by the dental service and  underwent dental extraction.  The patient has been seen in consultation and counseled at length regarding the indications, risks and potential benefits of surgery.  All questions have been answered, and the patient provides full informed consent for the operation as described.    DETAILS OF THE OPERATIVE PROCEDURE  Preparation:  The patient is brought to the operating room on the above mentioned date and central monitoring was established by the anesthesia team including placement of a radial arterial line.  Attempts to place central venous catheters from the right internal jugular vein were unsuccessful by the anesthesia team.  The patient is placed in the supine position on the operating table.  Intravenous antibiotics are administered. General endotracheal anesthesia is induced uneventfully. A Foley catheter is placed.   Numerous additional attempts to place central venous catheters for central venous access and hemodynamic monitoring by Dr. Jackolyn Confer were unsuccessful.  This included attempts to cannulate the right internal jugular, left internal jugular, right subclavian, and left subclavian veins.  Baseline transesophageal echocardiogram was performed.  Findings were notable for dilated left ventricle with severe global left ventricular systolic dysfunction.  Ejection fraction was estimated 25%.  There was severe aortic insufficiency.  The aortic valve was trileaflet.  There was no sign of leaflet prolapse but there appeared to be sign of possible perforation at the base of the left coronary leaflet.  There was mild to moderate (2+) central mitral regurgitation.  The patient's chest, abdomen, both groins, and both lower extremities are prepared and draped in a sterile manner. A time out procedure is performed.   Placement of  Central Venous Catheters and Swan-Ganz Pulmonary Artery Catheter:  The right common femoral vein is cannulated with Seldinger technique using ultrasound guidance.  Using fluoroscopic guidance a flexible guidewire was advanced through the inferior vena cava into the right atrium.  A long 8 [place] introducing sheath is advanced.  A second guidewire was placed using ultrasound guidance and a triple-lumen central venous catheter placed for intravenous access.  The catheter was flushed with saline solution.  A Swan-Ganz pulmonary artery catheter is advanced through the 8 [place] introducing sheath under fluoroscopic guidance into the right atrium.  The balloon is inflated and the catheter advanced into the right ventricle.  Multiple attempts to advance a catheter into the pulmonary artery were unsuccessful.  Decision was made to  leave the catheter in the right ventricle and place it into the pulmonary artery intraoperatively after initiation of cardiopulmonary bypass.   Surgical Approach and Conduit Harvest:  A median sternotomy incision was performed.  An unusual amount of blood was lost from the bone because the hospital had run out of the usual ground cellulose powder typically used as hemostatic agent.  Other topical hemostatic agents were used and hemostasis adequately achieved.  Simultaneously, the greater saphenous vein is obtained from the patient's right thigh using endoscopic vein harvest technique. The saphenous vein is notably large caliber but good quality conduit. After removal of the saphenous vein, the small surgical incisions in the lower extremity are closed with absorbable suture.    Extracorporeal Cardiopulmonary Bypass and Myocardial Protection:  The pericardium is opened. The ascending aorta is normal in appearance.  The right common femoral vein is cannulated with Seldinger technique using ultrasound guidance and a long guidewire advanced under TEE guidance through the right atrium  into the superior vena cava.  The patient is heparinized systemically.  A long femoral venous cannula is advanced under TEE guidance.  The ascending aorta is  cannulated for cardiopulmonary bypass.  Adequate heparinization is verified.   A second venous cannula is placed through the right atrium and advanced into the superior vena cava.  A retrograde cardioplegia cannula is placed through the right atrium into the coronary sinus.  The operative field was continuously flooded with carbon dioxide gas.  The entire pre-bypass portion of the operation was notable for stable hemodynamics.  Cardiopulmonary bypass was begun and a left ventricular vent placed through the right superior pulmonary vein.  The surface of the heart inspected. Distal target vessels are selected for coronary artery bypass grafting. A cardioplegia cannula is placed in the ascending aorta.  A temperature probe was placed in the interventricular septum.  The patient is cooled to 32C systemic temperature.  The aortic cross clamp is applied and cardioplegia is delivered initially in an antegrade fashion through the aortic root using modified [place] cold blood cardioplegia (Kennestone blood cardioplegia protocol).   Once ventricular fibrillation ensues the antegrade cardioplegia is stopped and the remainder of the resting dose administered retrograde through the coronary sinus catheter.  Myocardial cooling is notably slow and additional cardioplegia is administered until satisfactory myocardial cooling is ascertained.  Repeat doses of cardioplegia are administered at 90 minutes and every 30 minutes thereafter through the coronary sinus catheter through the subsequently placed vein graft, and antegrade directly into the left main and right coronary arteries using handheld Spencer cannulas.  Myocardial protection was felt to be excellent.   Coronary Artery Bypass Grafting:  The obtuse marginal branch of the left circumflex coronary artery was  grafted using a reversed saphenous vein graft in an end-to-side fashion.  At the site of distal anastomosis the target vessel was poor quality and measured approximately 2.0 mm in diameter.   Aortic Valve Replacement:  An oblique transverse aortotomy incision was performed.  The aortic valve was inspected.  Aortic valve was trileaflet and all 3 leaflets appeared essentially normal.  There is no sign of prolapse.  There appeared to be a hole at the base of the left coronary leaflet.  There were no signs of active vegetations.  The aortic valve leaflets were excised sharply and the aortic annulus decalcified.  There was boggy fibrous tissue at the base of the left coronary leaflet.  This is debrided and an abscess cavity unroofed.  Purulent liquid is evacuated and sent for culture and sensitivity with stat Gram stain.  This entire area is debrided extensively and irrigated with copious saline solution.   The aortic annulus beneath the left coronary artery is rebuilt using a series of interrupted horizontal mattress pledgeted 4-0 Prolene sutures.    Once the annulus was rebuilt it was sized to accept a 25 mm prosthesis.    Aortic valve replacement was performed using interrupted horizontal mattress 2-0 Ethibond pledgeted sutures with pledgets in the subannular position.  A Sorin [employer] valve (size 25 mm, catalog # [id number], serial [id number]) was implanted uneventfully. The valve seated appropriately with adequate space beneath the left main and right coronary artery.  Both leaflets open and closed without any sign of impingement.  The aortotomy was closed using a 2-layer closure of running 4-0 Prolene suture.   Procedure Completion:  The single proximal vein graft anastomosis was placed directly to the ascending aorta prior to removal of the aortic cross clamp.  One final dose of warm retrograde "reanimation dose" cardioplegia was administered through the coronary  sinus catheter while all air was evacuated through the aortic  root.  The aortic cross clamp was removed after a total cross clamp time of 126 minutes.  A small incision was made in the right atrium.  The Swan-Ganz catheter was grasped and manually placed through the tricuspid valve into the pulmonary artery under direct vision.  The incision in the right atrium was closed using a 2-layer running 3-0 Prolene suture.  The aortotomy and the of your the superior vena cava cannula was removed.  Proximal and distal coronary anastomoses were inspected for hemostasis and appropriate graft orientation. Epicardial pacing wires are fixed to the right ventricular outflow tract and to the right atrial appendage. The patient is rewarmed to 37C temperature. The aortic and left ventricular vents were removed.  The patient is weaned and disconnected from cardiopulmonary bypass.  The patient's rhythm at separation from bypass was sinus with ventricular bigeminy.  The patient was weaned from cardiopulmonary bypass on low dose milrinone and levophed drips. Total cardiopulmonary bypass time for the operation was 191 minutes.  Vasopressin was added for additional blood pressure support.  Followup transesophageal echocardiogram performed after separation from bypass revealed a well-seated bileaflet mechanical valve in the aortic position that was functioning normally.  There was no perivalvular leak.  There were otherwise no changes from the preoperative exam.  The aortic cannula was removed uneventfully. Protamine was administered to reverse the anticoagulation.  The femoral venous cannula was removed and manual pressure held on the right groin for 30 minutes.  The mediastinum and pleural space were inspected for hemostasis and irrigated with saline solution. The mediastinum and both pleural space were drained using 4 chest tubes placed through separate stab incisions inferiorly.  The soft tissues anterior to the aorta were  reapproximated loosely. The sternum is closed with double strength sternal wire. The soft tissues anterior to the sternum were closed in multiple layers and the skin is closed with a running subcuticular skin closure.  The patient received a total of 2 packs adult platelets and 2 units fresh frozen plasma due to coagulopathy and thrombocytopenia after separation from cardiopulmonary bypass and reversal of heparin with protamine.  There was some swelling in the right thigh at the completion of the procedure.  The skin incision just above the right knee was opened short distance and a #15 Jackson-Pratt drain was placed into the subcutaneous tissues to evacuate the blood from the Endo vein harvest tract.   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  The left common femoral vein is cannulated with Seldinger technique under ultrasound guidance and a guidewire advanced into the common iliac vein.  A Tridialysis temporary catheter was placed and secured to the skin.  All 3 lm were flushed.  The 2 dialysis lumens were then flushed with straight heparin solution.   Disposition:  The patient tolerated the procedure well and is transported to the surgical intensive care in stable condition. There are no intraoperative complications. All sponge instrument and needle counts are verified correct at completion of the operation.     Valentina Gu. Roxy Manns MD 09/01/2017 4:24 PM

## 2017-09-01 NOTE — Progress Notes (Signed)
Attempted R and L IJ cannulation. I was able to place 20 gauge IV in both IJs without being able to pass wire. Dr Ermalene Postin aware.  Lillia Abed MD

## 2017-09-02 ENCOUNTER — Inpatient Hospital Stay (HOSPITAL_COMMUNITY): Payer: Medicare Other

## 2017-09-02 DIAGNOSIS — R570 Cardiogenic shock: Secondary | ICD-10-CM

## 2017-09-02 DIAGNOSIS — N186 End stage renal disease: Secondary | ICD-10-CM

## 2017-09-02 DIAGNOSIS — Z954 Presence of other heart-valve replacement: Secondary | ICD-10-CM

## 2017-09-02 DIAGNOSIS — I2510 Atherosclerotic heart disease of native coronary artery without angina pectoris: Secondary | ICD-10-CM

## 2017-09-02 DIAGNOSIS — I5043 Acute on chronic combined systolic (congestive) and diastolic (congestive) heart failure: Secondary | ICD-10-CM

## 2017-09-02 LAB — BPAM FFP
Blood Product Expiration Date: 201905222359
Blood Product Expiration Date: 201905222359
ISSUE DATE / TIME: 201905171455
ISSUE DATE / TIME: 201905171455
UNIT TYPE AND RH: 7300
Unit Type and Rh: 7300

## 2017-09-02 LAB — PREPARE PLATELET PHERESIS
UNIT DIVISION: 0
Unit division: 0

## 2017-09-02 LAB — CBC
HEMATOCRIT: 36 % — AB (ref 39.0–52.0)
HEMATOCRIT: 35 % — AB (ref 39.0–52.0)
HEMOGLOBIN: 10.9 g/dL — AB (ref 13.0–17.0)
Hemoglobin: 10.6 g/dL — ABNORMAL LOW (ref 13.0–17.0)
MCH: 24.7 pg — AB (ref 26.0–34.0)
MCH: 24.7 pg — ABNORMAL LOW (ref 26.0–34.0)
MCHC: 30.3 g/dL (ref 30.0–36.0)
MCHC: 30.3 g/dL (ref 30.0–36.0)
MCV: 81.6 fL (ref 78.0–100.0)
MCV: 81.4 fL (ref 78.0–100.0)
Platelets: 271 10*3/uL (ref 150–400)
Platelets: 264 10*3/uL (ref 150–400)
RBC: 4.41 MIL/uL (ref 4.22–5.81)
RBC: 4.3 MIL/uL (ref 4.22–5.81)
RDW: 14.7 % (ref 11.5–15.5)
RDW: 14.8 % (ref 11.5–15.5)
WBC: 10.3 10*3/uL (ref 4.0–10.5)
WBC: 14 10*3/uL — AB (ref 4.0–10.5)

## 2017-09-02 LAB — RENAL FUNCTION PANEL
ALBUMIN: 2.7 g/dL — AB (ref 3.5–5.0)
Anion gap: 12 (ref 5–15)
BUN: 33 mg/dL — ABNORMAL HIGH (ref 6–20)
CALCIUM: 9 mg/dL (ref 8.9–10.3)
CO2: 21 mmol/L — AB (ref 22–32)
CREATININE: 8 mg/dL — AB (ref 0.61–1.24)
Chloride: 105 mmol/L (ref 101–111)
GFR calc Af Amer: 8 mL/min — ABNORMAL LOW (ref >60)
GFR, EST NON AFRICAN AMERICAN: 7 mL/min — AB (ref >60)
Glucose, Bld: 125 mg/dL — ABNORMAL HIGH (ref 65–99)
Phosphorus: 6.2 mg/dL — ABNORMAL HIGH (ref 2.5–4.6)
Potassium: 5.2 mmol/L — ABNORMAL HIGH (ref 3.5–5.1)
SODIUM: 138 mmol/L (ref 135–145)

## 2017-09-02 LAB — BLOOD GAS, ARTERIAL
Acid-base deficit: 2 mmol/L (ref 0.0–2.0)
Bicarbonate: 22.6 mmol/L (ref 20.0–28.0)
FIO2: 50
LHR: 12 {breaths}/min
MECHVT: 620 mL
O2 SAT: 98.3 %
PCO2 ART: 40.6 mmHg (ref 32.0–48.0)
PEEP/CPAP: 5 cmH2O
PO2 ART: 124 mmHg — AB (ref 83.0–108.0)
Patient temperature: 98.6
pH, Arterial: 7.364 (ref 7.350–7.450)

## 2017-09-02 LAB — BPAM PLATELET PHERESIS
BLOOD PRODUCT EXPIRATION DATE: 201905172359
BLOOD PRODUCT EXPIRATION DATE: 201905172359
ISSUE DATE / TIME: 201905171425
ISSUE DATE / TIME: 201905171425
Unit Type and Rh: 6200
Unit Type and Rh: 6200

## 2017-09-02 LAB — GLUCOSE, CAPILLARY
GLUCOSE-CAPILLARY: 132 mg/dL — AB (ref 65–99)
GLUCOSE-CAPILLARY: 120 mg/dL — AB (ref 65–99)
GLUCOSE-CAPILLARY: 122 mg/dL — AB (ref 65–99)
GLUCOSE-CAPILLARY: 126 mg/dL — AB (ref 65–99)
GLUCOSE-CAPILLARY: 124 mg/dL — AB (ref 65–99)
GLUCOSE-CAPILLARY: 115 mg/dL — AB (ref 65–99)
GLUCOSE-CAPILLARY: 124 mg/dL — AB (ref 65–99)
GLUCOSE-CAPILLARY: 120 mg/dL — AB (ref 65–99)
GLUCOSE-CAPILLARY: 119 mg/dL — AB (ref 65–99)
GLUCOSE-CAPILLARY: 125 mg/dL — AB (ref 65–99)
GLUCOSE-CAPILLARY: 115 mg/dL — AB (ref 65–99)
Glucose-Capillary: 109 mg/dL — ABNORMAL HIGH (ref 65–99)
Glucose-Capillary: 109 mg/dL — ABNORMAL HIGH (ref 65–99)
Glucose-Capillary: 117 mg/dL — ABNORMAL HIGH (ref 65–99)
Glucose-Capillary: 117 mg/dL — ABNORMAL HIGH (ref 65–99)
Glucose-Capillary: 121 mg/dL — ABNORMAL HIGH (ref 65–99)
Glucose-Capillary: 121 mg/dL — ABNORMAL HIGH (ref 65–99)
Glucose-Capillary: 124 mg/dL — ABNORMAL HIGH (ref 65–99)
Glucose-Capillary: 124 mg/dL — ABNORMAL HIGH (ref 65–99)
Glucose-Capillary: 127 mg/dL — ABNORMAL HIGH (ref 65–99)
Glucose-Capillary: 128 mg/dL — ABNORMAL HIGH (ref 65–99)
Glucose-Capillary: 138 mg/dL — ABNORMAL HIGH (ref 65–99)

## 2017-09-02 LAB — BASIC METABOLIC PANEL
Anion gap: 11 (ref 5–15)
BUN: 31 mg/dL — ABNORMAL HIGH (ref 6–20)
CALCIUM: 8.8 mg/dL — AB (ref 8.9–10.3)
CHLORIDE: 107 mmol/L (ref 101–111)
CO2: 22 mmol/L (ref 22–32)
CREATININE: 8.29 mg/dL — AB (ref 0.61–1.24)
GFR calc Af Amer: 8 mL/min — ABNORMAL LOW (ref >60)
GFR calc non Af Amer: 7 mL/min — ABNORMAL LOW (ref >60)
GLUCOSE: 131 mg/dL — AB (ref 65–99)
Potassium: 5.7 mmol/L — ABNORMAL HIGH (ref 3.5–5.1)
Sodium: 140 mmol/L (ref 135–145)

## 2017-09-02 LAB — PREPARE FRESH FROZEN PLASMA
Unit division: 0
Unit division: 0

## 2017-09-02 LAB — POCT I-STAT, CHEM 8
BUN: 30 mg/dL — AB (ref 6–20)
CALCIUM ION: 1.26 mmol/L (ref 1.15–1.40)
CHLORIDE: 105 mmol/L (ref 101–111)
Creatinine, Ser: 7.6 mg/dL — ABNORMAL HIGH (ref 0.61–1.24)
Glucose, Bld: 125 mg/dL — ABNORMAL HIGH (ref 65–99)
HEMATOCRIT: 33 % — AB (ref 39.0–52.0)
Hemoglobin: 11.2 g/dL — ABNORMAL LOW (ref 13.0–17.0)
Potassium: 5.2 mmol/L — ABNORMAL HIGH (ref 3.5–5.1)
SODIUM: 140 mmol/L (ref 135–145)
TCO2: 21 mmol/L — AB (ref 22–32)

## 2017-09-02 LAB — MAGNESIUM
MAGNESIUM: 2 mg/dL (ref 1.7–2.4)
Magnesium: 2 mg/dL (ref 1.7–2.4)

## 2017-09-02 LAB — COOXEMETRY PANEL
Carboxyhemoglobin: 1.2 % (ref 0.5–1.5)
Methemoglobin: 1.4 % (ref 0.0–1.5)
O2 Saturation: 82.2 %
Total hemoglobin: 10.5 g/dL — ABNORMAL LOW (ref 12.0–16.0)

## 2017-09-02 MED ORDER — AMIODARONE HCL IN DEXTROSE 360-4.14 MG/200ML-% IV SOLN
30.0000 mg/h | INTRAVENOUS | Status: DC
  Administered 2017-09-03 – 2017-09-04 (×4): 30 mg/h via INTRAVENOUS
  Filled 2017-09-02 (×4): qty 200

## 2017-09-02 MED ORDER — PRISMASOL BGK 4/2.5 32-4-2.5 MEQ/L IV SOLN
INTRAVENOUS | Status: DC
  Administered 2017-09-02: 11:00:00 via INTRAVENOUS_CENTRAL
  Filled 2017-09-02 (×4): qty 5000

## 2017-09-02 MED ORDER — AMIODARONE HCL IN DEXTROSE 360-4.14 MG/200ML-% IV SOLN
INTRAVENOUS | Status: AC
  Administered 2017-09-02: 23:00:00
  Administered 2017-09-02: 60 mg/h
  Filled 2017-09-02: qty 200

## 2017-09-02 MED ORDER — AMIODARONE LOAD VIA INFUSION
150.0000 mg | Freq: Once | INTRAVENOUS | Status: DC
  Filled 2017-09-02: qty 83.34

## 2017-09-02 MED ORDER — ATORVASTATIN CALCIUM 40 MG PO TABS
40.0000 mg | ORAL_TABLET | Freq: Every day | ORAL | Status: DC
  Administered 2017-09-02 – 2017-09-03 (×2): 40 mg
  Filled 2017-09-02 (×2): qty 1

## 2017-09-02 MED ORDER — HEPARIN SODIUM (PORCINE) 1000 UNIT/ML DIALYSIS
1000.0000 [IU] | INTRAMUSCULAR | Status: DC | PRN
  Administered 2017-09-03: 3000 [IU] via INTRAVENOUS_CENTRAL
  Filled 2017-09-02 (×3): qty 6

## 2017-09-02 MED ORDER — DEXTROSE 5 % IV SOLN
20.0000 g | INTRAVENOUS | Status: DC
  Administered 2017-09-02 – 2017-09-06 (×6): 20 g via INTRAVENOUS_CENTRAL
  Filled 2017-09-02 (×7): qty 200

## 2017-09-02 MED ORDER — DEXTROSE 5 % IV SOLN
Status: DC
  Administered 2017-09-02 – 2017-09-06 (×8): via INTRAVENOUS_CENTRAL
  Filled 2017-09-02 (×13): qty 1500

## 2017-09-02 MED ORDER — AMIODARONE HCL IN DEXTROSE 360-4.14 MG/200ML-% IV SOLN
60.0000 mg/h | INTRAVENOUS | Status: AC
  Administered 2017-09-03: 60 mg/h via INTRAVENOUS
  Filled 2017-09-02: qty 200

## 2017-09-02 MED ORDER — PRISMASOL BGK 4/2.5 32-4-2.5 MEQ/L IV SOLN
INTRAVENOUS | Status: DC
  Administered 2017-09-02 (×2): via INTRAVENOUS_CENTRAL
  Filled 2017-09-02 (×8): qty 5000

## 2017-09-02 MED ORDER — DEXMEDETOMIDINE HCL IN NACL 400 MCG/100ML IV SOLN
0.0000 ug/kg/h | INTRAVENOUS | Status: DC
  Administered 2017-09-02 – 2017-09-03 (×5): 0.7 ug/kg/h via INTRAVENOUS
  Filled 2017-09-02 (×7): qty 100

## 2017-09-02 MED ORDER — PRISMASOL B22GK 4/0 22-4 MEQ/L IV SOLN
INTRAVENOUS | Status: DC
  Administered 2017-09-02 – 2017-09-06 (×26): via INTRAVENOUS_CENTRAL
  Filled 2017-09-02 (×30): qty 5000

## 2017-09-02 MED ORDER — PRISMASOL BGK 4/2.5 32-4-2.5 MEQ/L IV SOLN
INTRAVENOUS | Status: DC
  Administered 2017-09-02 – 2017-09-06 (×9): via INTRAVENOUS_CENTRAL
  Filled 2017-09-02 (×8): qty 5000

## 2017-09-02 MED ORDER — SODIUM CHLORIDE 0.9 % IV SOLN: INTRAVENOUS | Status: DC | PRN

## 2017-09-02 MED ORDER — MIDODRINE HCL 5 MG PO TABS: 5.0000 mg | ORAL_TABLET | Freq: Three times a day (TID) | ORAL | Status: DC

## 2017-09-02 NOTE — Progress Notes (Signed)
Patient ID: [name] [name], male   DOB: April 07, 1966, 52 y.o.   MRN: [id number] [facility] DAILY ICU PROGRESS NOTE                   [address]            [place] [address]          [phone]   1 Day Post-Op Procedure(s) (LRB): AORTIC VALVE REPLACEMENT (AVR) (N/A) CORONARY ARTERY BYPASS GRAFTING (CABG) X 1, USING RIGHT GREATER SAPHENOUS VEIN HARVESTED ENDOSCOPICALLY (N/A) TRANSESOPHAGEAL ECHOCARDIOGRAM (TEE) (N/A) FLOROSCOPY GUIDED PLACEMENT OF RIGHT FEMORAL CENTRAL LINE TIMES TWO  AND PLACEMENT OF SWAN GANZ CATHETER (Right) PLACEMENT OF TRIALYSIS SHORT TERM DIALYSIS CATHETER  Total Length of Stay:  LOS: 1 day   Subjective: Remains intubated , neuro intact follows commands moves all extremities without difficulty  Objective: Vital signs in last 24 hours: Temp:  [96.1 F (35.6 C)-98.8 F (37.1 C)] 98.8 F (37.1 C) (05/18 0630) Pulse Rate:  [46-104] 98 (05/18 0731) Cardiac Rhythm: Atrial paced (05/18 0400) Resp:  [6-30] 27 (05/18 0731) BP: (78-104)/(45-78) 88/45 (05/18 0731) SpO2:  [97 %-100 %] 98 % (05/18 0731) Arterial Line BP: (77-114)/(51-67) 96/55 (05/18 0630) FiO2 (%):  [50 %] 50 % (05/18 0731) Weight:  [337 lb 8.4 oz (153.1 kg)] 337 lb 8.4 oz (153.1 kg) (05/18 0400)  Filed Weights   09/01/17 0611 09/02/17 0400  Weight: (!) 308 lb (139.7 kg) (!) 337 lb 8.4 oz (153.1 kg)    Weight change: 29 lb 8.4 oz (13.4 kg)   Hemodynamic parameters for last 24 hours: PAP: (23-46)/(3-23) 23/15 CO:  [5.3 L/min-7.3 L/min] 6.9 L/min CI:  [2.1 L/min/m2-2.9 L/min/m2] 2.7 L/min/m2  Intake/Output from previous day: 05/17 0701 - 05/18 0700 In: 8740.8 [I.V.:5088.8; Blood:2752; IV AJOINOMVE:720] Out: 9470 [Urine:3; Drains:25; Blood:1140; Chest Tube:300]  Intake/Output this shift: No intake/output data recorded.  Current Meds: Scheduled Meds: . acetaminophen  1,000 mg Oral Q6H   Or  . acetaminophen (TYLENOL) oral liquid 160 mg/5 mL  1,000 mg Per Tube Q6H  . aspirin EC   325 mg Oral Daily   Or  . aspirin  324 mg Per Tube Daily  . bisacodyl  10 mg Oral Daily   Or  . bisacodyl  10 mg Rectal Daily  . chlorhexidine  15 mL Mouth/Throat NOW  . chlorhexidine gluconate (MEDLINE KIT)  15 mL Mouth Rinse BID  . Chlorhexidine Gluconate Cloth  6 each Topical Daily  . docusate sodium  200 mg Oral Daily  . insulin regular  0-10 Units Intravenous TID WC  . mouth rinse  15 mL Mouth Rinse 10 times per day  . metoprolol tartrate  12.5 mg Oral BID   Or  . metoprolol tartrate  12.5 mg Per Tube BID  . [START ON 09/03/2017] pantoprazole  40 mg Oral Daily  . sodium chloride flush  10-40 mL Intracatheter Q12H  . sodium chloride flush  3 mL Intravenous Q12H   Continuous Infusions: . sodium chloride 20 mL/hr at 09/01/17 2000  . sodium chloride    . sodium chloride 10 mL/hr at 09/01/17 2000  . sodium chloride    . albumin human    . cefUROXime (ZINACEF)  IV Stopped (09/01/17 2135)  . dexmedetomidine (PRECEDEX) IV infusion    . insulin (NOVOLIN-R) infusion 1.9 Units/hr (09/02/17 0819)  . lactated ringers    . lactated ringers    . lactated ringers 10 mL/hr at 09/01/17 2000  . milrinone  0.3 mcg/kg/min (09/02/17 0437)  . nitroGLYCERIN Stopped (09/01/17 1702)  . norepinephrine (LEVOPHED) Adult infusion 20 mcg/min (09/02/17 0626)  . phenylephrine (NEO-SYNEPHRINE) Adult infusion Stopped (09/01/17 1900)  . dialysis replacement fluid (prismasate)    . dialysis replacement fluid (prismasate)    . dialysate (PRISMASATE)    . vasopressin (PITRESSIN) infusion - *FOR SHOCK* 0.04 Units/min (09/02/17 0637)   PRN Meds:.sodium chloride, sodium chloride, albumin human, heparin, lactated ringers, metoprolol tartrate, midazolam, morphine injection, ondansetron (ZOFRAN) IV, oxyCODONE, sodium chloride flush, sodium chloride flush, traMADol  General appearance: alert, cooperative, mild distress and Remains sedated on the ventilator Neurologic: intact Heart: regular rate and rhythm, S1, S2  normal, no murmur, click, rub or gallop Lungs: diminished breath sounds bibasilar Abdomen: soft, non-tender; bowel sounds normal; no masses,  no organomegaly Extremities: Hemovac drain in the right thigh Wound: Sternum stable  Lab Results: CBC: Recent Labs    09/01/17 2300 09/02/17 0345  WBC 11.3* 10.3  HGB 11.0* 10.9*  HCT 36.2* 36.0*  PLT 273 271   BMET:  Recent Labs    09/01/17 0632  09/01/17 2257 09/01/17 2300 09/02/17 0345  NA 139   < > 141  --  140  K 3.9   < > 4.9  --  5.7*  CL 97*   < > 106  --  107  CO2 28  --   --   --  22  GLUCOSE 85   < > 131*  --  131*  BUN 26*   < > 26*  --  31*  CREATININE 8.78*   < > 7.40* 8.70* 8.29*  CALCIUM 9.5  --   --   --  8.8*   < > = values in this interval not displayed.    CMET: Lab Results  Component Value Date   WBC 10.3 09/02/2017   HGB 10.9 (L) 09/02/2017   HCT 36.0 (L) 09/02/2017   PLT 271 09/02/2017   GLUCOSE 131 (H) 09/02/2017   CHOL 169 05/16/2013   TRIG 97 05/16/2013   HDL 35 (L) 05/16/2013   LDLCALC 115 (H) 05/16/2013   ALT 9 (L) 08/21/2017   AST 15 08/21/2017   NA 140 09/02/2017   K 5.7 (H) 09/02/2017   CL 107 09/02/2017   CREATININE 8.29 (H) 09/02/2017   BUN 31 (H) 09/02/2017   CO2 22 09/02/2017   TSH 1.66 05/16/2013   INR 1.39 09/01/2017   HGBA1C 5.0 08/21/2017      PT/INR:  Recent Labs    09/01/17 1932  LABPROT 16.9*  INR 1.39   Radiology: Dg Chest Port 1 View  Result Date: 09/02/2017 CLINICAL DATA:  Intubation, aortic valve replacement and CABG. EXAM: PORTABLE CHEST 1 VIEW COMPARISON:  09/02/2017. FINDINGS: Endotracheal tube terminates 5.6 cm above the carina. Nasogastric tube is followed into the stomach. Mediastinal drain and bilateral chest tubes in place. Inferior approach Swan-Ganz catheter appears looped in the left pulmonary artery. Cardiomediastinal silhouette is stable. Lungs are low in volume with scattered atelectasis, basilar predominant. No definite pleural fluid. Difficult to  exclude trace right pleural air. IMPRESSION: 1. Swan-Ganz catheter remains looped in the left pulmonary artery. Clinical service was made aware of this earlier today, per the comparison chest x-ray report 09/02/2017. 2. Difficult to exclude trace right pleural air with right chest tube in place. 3. No definite left pneumothorax. 4. Low lung volumes basilar predominant atelectasis. Electronically Signed   By: Lorin Picket M.D.   On: 09/02/2017 08:13   Dg  Chest Port 1 View  Result Date: 09/02/2017 CLINICAL DATA:  Atelectasis EXAM: PORTABLE CHEST 1 VIEW COMPARISON:  Yesterday FINDINGS: Higher endotracheal tube, tip at C7-T1. An orogastric tube reaches the stomach. A Swan-Ganz catheter is looped at the level of the main and left pulmonary arteries. Thoracic drains in place. Low lung volumes with atelectasis. No edema or pneumothorax. Artifact from EKG leads. These results will be called to the ordering clinician or representative by the Radiologist Assistant, and communication documented in the PACS or zVision Dashboard. IMPRESSION: 1. Higher endotracheal tube with tip at C7-T1. Consider advancement by 4-5 cm. 2. Remaining hardware appears stable, including looping of the Swan-Ganz catheter. 3. Low volume chest with atelectasis. Electronically Signed   By: Monte Fantasia M.D.   On: 09/02/2017 08:06   Dg Chest Port 1 View  Result Date: 09/01/2017 CLINICAL DATA:  Status post CABG. EXAM: PORTABLE CHEST 1 VIEW COMPARISON:  Chest x-ray from yesterday. FINDINGS: Endotracheal tube in position with the tip 6.4 cm above the level of the carina. Enteric tube tip in the stomach. Femoral approach Swan-Ganz catheter with the tip in the pulmonary outflow tract. Mediastinal and bilateral chest tubes are in good position. Interval CABG. Cardiomegaly. Low lung volumes with bronchovascular crowding. Bilateral perihilar opacities are not significantly changed. No significant pleural effusion. No pneumothorax. No acute osseous  abnormality. IMPRESSION: 1. Status post CABG. Bilateral perihilar opacities are unchanged and could reflect bronchovascular crowding due to low lung volumes or pulmonary edema. Electronically Signed   By: Titus Dubin M.D.   On: 09/01/2017 17:31   Dg Chest Portable 1 View  Result Date: 09/01/2017 CLINICAL DATA:  Unsuccessful central line placement EXAM: PORTABLE CHEST 1 VIEW COMPARISON:  Portable exam 0858 hours compared to 09/01/2017 FINDINGS: Tip of endotracheal tube projects approximately 6.6 cm above carina. Tip of endoscope projects over mid esophagus. Low lung volumes. Enlargement of cardiac silhouette. Perihilar infiltrates greater on LEFT, new. No gross pleural effusion or pneumothorax. IMPRESSION: Low lung volumes with BILATERAL perihilar infiltrates greater on LEFT. No pneumothorax. Enlargement of cardiac silhouette. Electronically Signed   By: Lavonia Dana M.D.   On: 09/01/2017 09:21   Dg Fluoro Guide Cv Line-no Report  Result Date: 09/01/2017 Fluoroscopy was utilized by the requesting physician.  No radiographic interpretation.   Results for orders placed or performed during the hospital encounter of 09/01/17  Surgical pcr screen     Status: None   Collection Time: 09/01/17  6:32 AM  Result Value Ref Range Status   MRSA, PCR NEGATIVE NEGATIVE Final   Staphylococcus aureus NEGATIVE NEGATIVE Final    Comment: (NOTE) The Xpert SA Assay (FDA approved for NASAL specimens in patients 71 years of age and older), is one component of a comprehensive surveillance program. It is not intended to diagnose infection nor to guide or monitor treatment. Performed at [facility], [place] [address] [place], [place] [address]   Aerobic/Anaerobic Culture (surgical/deep wound)     Status: None (Preliminary result)   Collection Time: 09/01/17 12:20 PM  Result Value Ref Range Status   Specimen Description AORTIC VALVE TISSUE  Final   Special Requests NONE  Final   Gram Stain   Final    NO  WBC SEEN NO ORGANISMS SEEN Performed at [facility], [place] [address], [place], [place] [address]    Culture PENDING  Incomplete   Report Status PENDING  Incomplete    Assessment/Plan: S/P Procedure(s) (LRB): AORTIC VALVE REPLACEMENT (AVR) (N/A) CORONARY  ARTERY BYPASS GRAFTING (CABG) X 1, USING RIGHT GREATER SAPHENOUS VEIN HARVESTED ENDOSCOPICALLY (N/A) TRANSESOPHAGEAL ECHOCARDIOGRAM (TEE) (N/A) FLOROSCOPY GUIDED PLACEMENT OF RIGHT FEMORAL CENTRAL LINE TIMES TWO  AND PLACEMENT OF SWAN GANZ CATHETER (Right) PLACEMENT OF TRIALYSIS SHORT TERM DIALYSIS CATHETER Endotracheal tube repositioned-patient was closed extubation on early films morning Discussed case with renal service who will see the patient today and  start CVVH-with pressor demands now unlikely the patient would tolerate formal dialysis FiO2 requirements at 50%, -we will wait to attempt to wean the ventilator until some fluid is off with CVVH Cardiac index is at 3, still requiring vasopressin and levo fed to maintain a blood pressure approximately 90 systolic We will check cox and wean milrinone Leave chest tubes in for now So far micro on the aortic valve tissue is negative-culture still pending  Grace Isaac 09/02/2017 8:45 AM

## 2017-09-02 NOTE — Consult Note (Signed)
[facility] Renal Consultation Note  Requesting MD: Servando Snare Indication for Consultation: ESRD pt pos AVR  HPI:  [name] [name] is a 52 y.o. male past medical history significant for obesity, long-standing poorly controlled hypertension with congestive heart failure- EF 20-25%.  Also ESRD- has been on HD for 17 years- lives in [place],  [facility].  He has an AVF.  Recently been having more SOB, low BPs with HD- cards work up found multivessel CAD, moderate pulmonary HTN and severe AI.  He is s/p AVR and single vessel bypass on 5/17.  Post op he is on multiple drips.  K was found to be 5.7 so have initiated CRRT this AM.  A left femoral access was placed in the OR  HD orders- [facility]- 4 hours and 45 min TTS using left AVF 450/800- 2K/2.5 calc - EDW 140.5 2000 per hour of heparin/venofer 50 qTues/hect 12 mcg q tx/parsabiv 2.5 q tx  Creatinine  Date/Time Value Ref Range Status  11/13/2013 04:08 AM 16.45 (H) 0.60 - 1.30 mg/dL Final  11/12/2013 06:48 PM 20.99 (H) 0.60 - 1.30 mg/dL Final  11/11/2013 02:35 PM 19.37 (H) 0.60 - 1.30 mg/dL Final  05/27/2013 10:26 AM 12.26 (H) 0.60 - 1.30 mg/dL Final  05/16/2013 06:16 AM 15.91 (H) 0.60 - 1.30 mg/dL Final   Creatinine, Ser  Date/Time Value Ref Range Status  09/02/2017 03:45 AM 8.29 (H) 0.61 - 1.24 mg/dL Final  09/01/2017 11:00 PM 8.70 (H) 0.61 - 1.24 mg/dL Final  09/01/2017 10:57 PM 7.40 (H) 0.61 - 1.24 mg/dL Final  09/01/2017 08:15 PM 8.47 (H) 0.61 - 1.24 mg/dL Final  09/01/2017 03:10 PM 7.20 (H) 0.61 - 1.24 mg/dL Final  09/01/2017 01:36 PM 7.70 (H) 0.61 - 1.24 mg/dL Final  09/01/2017 12:46 PM 8.20 (H) 0.61 - 1.24 mg/dL Final  09/01/2017 11:46 AM 7.50 (H) 0.61 - 1.24 mg/dL Final  09/01/2017 11:25 AM 7.70 (H) 0.61 - 1.24 mg/dL Final  09/01/2017 09:08 AM 8.00 (H) 0.61 - 1.24 mg/dL Final  09/01/2017 06:32 AM 8.78 (H) 0.61 - 1.24 mg/dL Final  08/21/2017 11:42 AM 15.16 (H) 0.61 - 1.24 mg/dL Final   08/03/2017 02:50 AM 10.24 (H) 0.61 - 1.24 mg/dL Final  08/02/2017 02:51 AM 12.68 (H) 0.61 - 1.24 mg/dL Final  08/01/2017 04:03 AM 10.55 (H) 0.61 - 1.24 mg/dL Final  07/31/2017 03:01 AM 14.77 (H) 0.61 - 1.24 mg/dL Final  07/30/2017 04:36 AM 12.54 (H) 0.61 - 1.24 mg/dL Final  07/28/2017 06:30 PM 10.46 (H) 0.61 - 1.24 mg/dL Final  07/14/2017 12:14 PM 8.72 (H) 0.61 - 1.24 mg/dL Final  03/16/2016 09:48 AM 11.38 (H) 0.61 - 1.24 mg/dL Final  11/13/2015 09:49 AM 10.59 (H) 0.61 - 1.24 mg/dL Final  04/06/2012 02:26 PM 17.82 (H) 0.50 - 1.35 mg/dL Final  08/23/2011 11:02 AM 12.50 (H) 0.50 - 1.35 mg/dL Final  03/01/2011 08:05 AM 12.61 (H) 0.50 - 1.35 mg/dL Final  02/10/2011 07:58 AM 10.20 (H) 0.50 - 1.35 mg/dL Final  10/26/2010 08:24 AM 14.10 (H) 0.50 - 1.35 mg/dL Final  06/15/2010 07:03 AM 11.4 (H) 0.4 - 1.5 mg/dL Final  06/07/2010 03:32 PM 10.64 (H) 0.4 - 1.5 mg/dL Final     PMHx:   Past Medical History:  Diagnosis Date  . Aneurysm ([place])     Right arm fistula 3 aneurysms 2011,   plans to have a new procedure  . Angina   . Chest discomfort   . CHF (congestive heart failure) ([place])   .  Coronary artery disease   . Coronary artery disease involving native coronary artery of native heart without angina pectoris   . Dialysis patient ([place])   . Dysrhythmia   . ESRD (end stage renal disease) ([place])    on hemodialysis T_T_S  . Hypertension   . Leg pain   . Mitral regurgitation   . Morbid obesity ([place])   . Orthostatic hypotension   . Overweight(278.02)   . 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 Severe aortic insufficiency   . Sinus tachycardia   . Syncope     positional after dialysis... 2008    Past Surgical History:  Procedure Laterality Date  . A/V FISTULAGRAM Left 06/30/2016   Procedure: A/V Fistulagram;  Surgeon: Algernon Huxley, MD;  Location: [facility];  Service: Cardiovascular;  Laterality: Left;  . ARTERIOVENOUS GRAFT PLACEMENT    . AV FISTULA PLACEMENT    . AV FISTULA PLACEMENT Left 11/18/2015   Procedure: ARTERIOVENOUS (AV) FISTULA CREATION ( RADIOCEPHALIC );  Surgeon: Algernon Huxley, MD;  Location: [facility];  Service: Vascular;  Laterality: Left;  . AV FISTULA PLACEMENT Left 03/23/2016   Procedure: ARTERIOVENOUS (AV) FISTULA CREATION ( REVISION );  Surgeon: Algernon Huxley, MD;  Location: [facility];  Service: Vascular;  Laterality: Left;  . [place] REMOVAL Left 03/23/2016   Procedure: REMOVAL OF ARTERIOVENOUS GORETEX GRAFT ([place]);  Surgeon: Algernon Huxley, MD;  Location: [facility];  Service: Vascular;  Laterality: Left;  . DIALYSIS/PERMA CATHETER REMOVAL N/A 08/01/2016   Procedure: Dialysis/Perma Catheter Removal;  Surgeon: Algernon Huxley, MD;  Location: [facility];  Service: Cardiovascular;  Laterality: N/A;  . HERNIA REPAIR    . INSERTION OF DIALYSIS CATHETER  03/01/2011   Procedure: INSERTION OF DIALYSIS CATHETER;  Surgeon: Elam Dutch, MD;  Location: [facility];  Service: Vascular;  Laterality: Left;  Exchange of Dialysis Catheter to 27cm 15Fr. Arrow Catheter  . MULTIPLE EXTRACTIONS WITH ALVEOLOPLASTY N/A 08/07/2017   Procedure: Extraction of tooth #2, 7,8,13,14,15,17, and 31 with alveoloplasty and gross debridement of remaining teeth;  Surgeon: [name], DDS;  Location: [facility];  Service: Oral Surgery;  Laterality: N/A;  . PERIPHERAL VASCULAR CATHETERIZATION N/A 09/01/2014   Procedure: A/V Shuntogram/Fistulagram;  Surgeon: Algernon Huxley, MD;  Location: [facility];  Service: Cardiovascular;  Laterality: N/A;  . PERIPHERAL VASCULAR CATHETERIZATION Right 09/01/2014   Procedure: Thrombectomy;  Surgeon: Algernon Huxley, MD;  Location: [facility];  Service: Cardiovascular;  Laterality: Right;  . PERIPHERAL VASCULAR CATHETERIZATION Right 09/01/2014   Procedure: A/V Shunt Intervention;  Surgeon: Algernon Huxley, MD;   Location: [facility];  Service: Cardiovascular;  Laterality: Right;  . PERIPHERAL VASCULAR CATHETERIZATION N/A 09/17/2014   Procedure: A/V Shuntogram/Fistulagram;  Surgeon: Algernon Huxley, MD;  Location: [facility];  Service: Cardiovascular;  Laterality: N/A;  . PERIPHERAL VASCULAR CATHETERIZATION Right 10/17/2014   Procedure: Thrombectomy;  Surgeon: Katha Cabal, MD;  Location: [facility];  Service: Cardiovascular;  Laterality: Right;  . PERIPHERAL VASCULAR CATHETERIZATION N/A 10/17/2014   Procedure: A/V Shuntogram/Fistulagram;  Surgeon: Katha Cabal, MD;  Location: [facility];  Service: Cardiovascular;  Laterality: N/A;  . PERIPHERAL VASCULAR CATHETERIZATION N/A 10/17/2014   Procedure: A/V Shunt Intervention;  Surgeon: Katha Cabal, MD;  Location: [facility];  Service: Cardiovascular;  Laterality: N/A;  . PERIPHERAL VASCULAR CATHETERIZATION Right 11/04/2014   Procedure: Thrombectomy;  Surgeon: Katha Cabal, MD;  Location: [facility];  Service: Cardiovascular;  Laterality: Right;  . PERIPHERAL VASCULAR CATHETERIZATION Left 11/04/2014   Procedure: Visceral Venography;  Surgeon: Katha Cabal, MD;  Location: [facility];  Service: Cardiovascular;  Laterality: Left;  . PERIPHERAL VASCULAR CATHETERIZATION Right 11/27/2014   Procedure: A/V Shuntogram/Fistulagram;  Surgeon: Algernon Huxley, MD;  Location: [facility];  Service: Cardiovascular;  Laterality: Right;  . PERIPHERAL VASCULAR CATHETERIZATION Right 11/27/2014   Procedure: A/V Shunt Intervention;  Surgeon: Algernon Huxley, MD;  Location: [facility];  Service: Cardiovascular;  Laterality: Right;  . PERIPHERAL VASCULAR CATHETERIZATION Right 12/31/2014   Procedure: Thrombectomy;  Surgeon: Algernon Huxley, MD;  Location: [facility];  Service: Cardiovascular;  Laterality: Right;  . PERIPHERAL VASCULAR CATHETERIZATION Right 01/12/2015   Procedure: A/V  Shuntogram/Fistulagram;  Surgeon: Algernon Huxley, MD;  Location: [facility];  Service: Cardiovascular;  Laterality: Right;  . PERIPHERAL VASCULAR CATHETERIZATION N/A 01/12/2015   Procedure: A/V Shunt Intervention;  Surgeon: Algernon Huxley, MD;  Location: [facility];  Service: Cardiovascular;  Laterality: N/A;  . PERIPHERAL VASCULAR CATHETERIZATION Right 01/28/2015   Procedure: Thrombectomy;  Surgeon: Algernon Huxley, MD;  Location: [facility];  Service: Cardiovascular;  Laterality: Right;  . PERIPHERAL VASCULAR CATHETERIZATION N/A 06/08/2015   Procedure: A/V Shuntogram/Fistulagram;  Surgeon: Algernon Huxley, MD;  Location: [facility];  Service: Cardiovascular;  Laterality: N/A;  . PERIPHERAL VASCULAR CATHETERIZATION N/A 06/08/2015   Procedure: A/V Shunt Intervention;  Surgeon: Algernon Huxley, MD;  Location: [facility];  Service: Cardiovascular;  Laterality: N/A;  . PERIPHERAL VASCULAR CATHETERIZATION Right 07/06/2015   Procedure: A/V Shuntogram/Fistulagram;  Surgeon: Algernon Huxley, MD;  Location: [facility];  Service: Cardiovascular;  Laterality: Right;  . PERIPHERAL VASCULAR CATHETERIZATION N/A 07/06/2015   Procedure: A/V Shunt Intervention;  Surgeon: Algernon Huxley, MD;  Location: [facility];  Service: Cardiovascular;  Laterality: N/A;  . PERIPHERAL VASCULAR CATHETERIZATION N/A 08/04/2015   Procedure: graft declot;  Surgeon: Katha Cabal, MD;  Location: [facility];  Service: Cardiovascular;  Laterality: N/A;  . PERIPHERAL VASCULAR CATHETERIZATION Right 08/25/2015   Procedure: A/V Shuntogram/Fistulagram;  Surgeon: Katha Cabal, MD;  Location: [facility];  Service: Cardiovascular;  Laterality: Right;  . PERIPHERAL VASCULAR CATHETERIZATION N/A 11/04/2015   Procedure: Dialysis/Perma Catheter Insertion;  Surgeon: Algernon Huxley, MD;  Location: [facility];  Service: Cardiovascular;  Laterality: N/A;  . PERIPHERAL VASCULAR  CATHETERIZATION Left 12/14/2015   Procedure: A/V Shuntogram/Fistulagram;  Surgeon: Algernon Huxley, MD;  Location: [facility];  Service: Cardiovascular;  Laterality: Left;  . PERIPHERAL VASCULAR CATHETERIZATION N/A 12/14/2015   Procedure: A/V Shunt Intervention;  Surgeon: Algernon Huxley, MD;  Location: [facility];  Service: Cardiovascular;  Laterality: N/A;  . PERIPHERAL VASCULAR CATHETERIZATION N/A 12/17/2015   Procedure: Dialysis/Perma Catheter Insertion;  Surgeon: Algernon Huxley, MD;  Location: [facility];  Service: Cardiovascular;  Laterality: N/A;  . PERIPHERAL VASCULAR CATHETERIZATION Left 12/22/2015   Procedure: Dialysis/Perma Catheter Insertion;  Surgeon: Katha Cabal, MD;  Location: [facility];  Service: Cardiovascular;  Laterality: Left;  . PERIPHERAL VASCULAR CATHETERIZATION Left 02/29/2016   Procedure: A/V Shuntogram/Fistulagram;  Surgeon: Algernon Huxley, MD;  Location: [facility];  Service: Cardiovascular;  Laterality: Left;  . PERIPHERAL VASCULAR CATHETERIZATION N/A 02/29/2016   Procedure: A/V Shunt Intervention;  Surgeon: Algernon Huxley, MD;  Location: [facility];  Service: Cardiovascular;  Laterality: N/A;  . right arm graft     for dyalisis  . RIGHT/LEFT HEART CATH AND CORONARY ANGIOGRAPHY N/A 08/02/2017   Procedure: RIGHT/LEFT HEART CATH AND CORONARY ANGIOGRAPHY;  Surgeon: Larey Dresser, MD;  Location: [facility];  Service: Cardiovascular;  Laterality: N/A;  . TEE WITHOUT CARDIOVERSION N/A 07/31/2017   Procedure: TRANSESOPHAGEAL ECHOCARDIOGRAM (TEE);  Surgeon: Josue Hector, MD;  Location: [facility];  Service: Cardiovascular;  Laterality: N/A;  . THROMBECTOMY    . THROMBECTOMY W/ EMBOLECTOMY  03/01/2011   Procedure: THROMBECTOMY ARTERIOVENOUS GORE-TEX GRAFT;  Surgeon: Elam Dutch, MD;  Location: [facility];  Service: Vascular;  Laterality: Right;  Attempted Thrombectomy of Old  Right Upper Arm Arteriovenous gortex Graft.  Insertion of new Arteriovenous Graft using 44m x 50cm Gortex Stretch graft.   . THROMBECTOMY W/ EMBOLECTOMY  07/11/2011   Procedure: THROMBECTOMY ARTERIOVENOUS GORE-TEX GRAFT;  Surgeon: TRosetta Posner MD;  [place]  Service: Vascular;  Laterality: Right;  . UMBILICAL HERNIA REPAIR    . VENOGRAM N/A 08/23/2011   Procedure: VENOGRAM;  Surgeon: VSerafina Mitchell MD;  [facility];  Service: Cardiovascular;  Laterality: N/A;    Family Hx:  Family History  Problem Relation Age of Onset  . Hypertension Mother   . Heart disease Mother   . Hypertension Father   . Heart disease Father     Social History:  reports that he quit smoking about 23 years ago. His smoking use included cigarettes. His smokeless tobacco use includes snuff. He reports that he does not drink alcohol or use drugs.  Allergies: No Known Allergies  Medications: Prior to Admission medications   Medication Sig Start Date End Date Taking? Authorizing Provider  atorvastatin (LIPITOR) 40 MG tablet Take 1 tablet (40 mg total) by mouth daily at 6 PM. 08/03/17  Yes TEugenie Filler MD  B Complex-C-Folic Acid (NEPHRO-VITE PO) Take 1 tablet by mouth daily.    Yes [provider]  chlorhexidine (PERIDEX) 0.12 % solution Rinse with 15 mls twice daily for 30 seconds. Use after breakfast and at bedtime. Spit out excess. Do not swallow. Patient taking differently: Use as directed 15 mLs in the mouth or throat 4 (four) times daily. For 30 seconds. Use after breakfast and at bedtime. Spit out excess. Do not swallow. 08/14/17  Yes KLenn Cal DDS  FOSRENOL 500 MG chewable tablet Chew 1,500-2,000 mg by mouth See admin instructions. Take 2000 mg by mouth with meals and take 1500 mg by mouth with snacks 05/28/13  Yes [provider]  ivabradine (CORLANOR) 5 MG TABS tablet Take 1 tablet (5 mg total) by mouth 2 (two) times daily with a meal. Patient taking differently: Take 5 mg by mouth daily.  08/09/17  Yes  MLarey Dresser MD  metoprolol succinate (TOPROL-XL) 50 MG 24 hr tablet Take 1 tablet (50 mg total) by mouth daily. 08/14/17  Yes MLarey Dresser MD  midodrine (PROAMATINE) 5 MG tablet Take 5 mg by mouth See admin instructions. Take 5 mg by mouth daily on Tuesday, Thursday and Saturday. 02/09/16  Yes [provider]  oxyCODONE-acetaminophen (PERCOCET) 5-325 MG tablet Take one or two tablets by mouth every 6  hours as needed for pain. Patient taking differently: Take 1 tablet by mouth every 4 (four) hours as needed for severe pain.  08/07/17  Yes [name], DDS  benzonatate (TESSALON) 100 MG capsule 2 po tid for cough 07/14/17   Lily Kocher, PA-C  nitroGLYCERIN (NITROSTAT) 0.4 MG SL tablet Place 1 tablet (0.4 mg total) under the tongue every 5 (five) minutes as needed. For chest Patient taking differently: Place 0.4 mg under the tongue every 5 (five) minutes as needed for chest pain.  05/30/13   Richardson Dopp T, PA-C    I have reviewed the patient's current medications.  Labs:  Results for orders placed or performed during the hospital encounter of 09/01/17 (from the past 48 hour(s))  APTT     Status: None   Collection Time: 09/01/17  6:32 AM  Result Value Ref Range   aPTT 36 24 - 36 seconds    Comment: Performed at [facility] [address] [place], [place] [address]  Basic metabolic panel     Status: Abnormal   Collection Time: 09/01/17  6:32 AM  Result Value Ref Range   Sodium 139 135 - 145 mmol/L   Potassium 3.9 3.5 - 5.1 mmol/L   Chloride 97 (L) 101 - 111 mmol/L   CO2 28 22 - 32 mmol/L   Glucose, Bld 85 65 - 99 mg/dL   BUN 26 (H) 6 - 20 mg/dL   Creatinine, Ser 8.78 (H) 0.61 - 1.24 mg/dL   Calcium 9.5 8.9 - 10.3 mg/dL   GFR calc non Af Amer 6 (L) >60 mL/min   GFR calc Af Amer 7 (L) >60 mL/min    Comment: (NOTE) The eGFR has been calculated using the CKD EPI equation. This calculation has not been validated in all clinical situations. eGFR's  persistently <60 mL/min signify possible Chronic Kidney Disease.    Anion gap 14 5 - 15    Comment: Performed at [facility] [address], [place], [place] [address]  CBC     Status: Abnormal   Collection Time: 09/01/17  6:32 AM  Result Value Ref Range   WBC 6.8 4.0 - 10.5 K/uL   RBC 5.68 4.22 - 5.81 MIL/uL   Hemoglobin 14.0 13.0 - 17.0 g/dL   HCT 46.7 39.0 - 52.0 %   MCV 82.2 78.0 - 100.0 fL   MCH 24.6 (L) 26.0 - 34.0 pg   MCHC 30.0 30.0 - 36.0 g/dL   RDW 15.0 11.5 - 15.5 %   Platelets 291 150 - 400 K/uL    Comment: Performed at [facility], [place] [address] [place],  [address]  Surgical pcr screen     Status: None   Collection Time: 09/01/17  6:32 AM  Result Value Ref Range   MRSA, PCR NEGATIVE NEGATIVE   Staphylococcus aureus NEGATIVE NEGATIVE    Comment: (NOTE) The Xpert SA Assay (FDA approved for NASAL specimens in patients 61 years of age and older), is one component of a comprehensive surveillance program. It is not intended to diagnose infection nor to guide or monitor treatment. Performed at [facility], [place] [address] [place], [place] [address]   I-STAT, [place] 8     Status: Abnormal   Collection Time: 09/01/17  9:08 AM  Result Value Ref Range   Sodium 141 135 - 145 mmol/L   Potassium 4.0 3.5 - 5.1 mmol/L   Chloride 97 (L) 101 - 111 mmol/L  BUN 27 (H) 6 - 20 mg/dL   Creatinine, Ser 8.00 (H) 0.61 - 1.24 mg/dL   Glucose, Bld 95 65 - 99 mg/dL   Calcium, Ion 1.18 1.15 - 1.40 mmol/L   TCO2 28 22 - 32 mmol/L   Hemoglobin 15.0 13.0 - 17.0 g/dL   HCT 44.0 39.0 - 52.0 %  I-STAT 3, arterial blood gas (G3+)     Status: Abnormal   Collection Time: 09/01/17  9:20 AM  Result Value Ref Range   pH, Arterial 7.424 7.350 - 7.450   pCO2 arterial 43.9 32.0 - 48.0 mmHg   pO2, Arterial 145.0 (H) 83.0 - 108.0 mmHg   Bicarbonate 28.7 (H) 20.0 - 28.0 mmol/L   TCO2 30 22 - 32 mmol/L   O2 Saturation 99.0 %   Acid-Base Excess 4.0 (H) 0.0 - 2.0 mmol/L    Patient temperature HIDE    Sample type ARTERIAL   I-STAT, chem 8     Status: Abnormal   Collection Time: 09/01/17 11:25 AM  Result Value Ref Range   Sodium 139 135 - 145 mmol/L   Potassium 3.8 3.5 - 5.1 mmol/L   Chloride 100 (L) 101 - 111 mmol/L   BUN 28 (H) 6 - 20 mg/dL   Creatinine, Ser 7.70 (H) 0.61 - 1.24 mg/dL   Glucose, Bld 97 65 - 99 mg/dL   Calcium, Ion 1.17 1.15 - 1.40 mmol/L   TCO2 29 22 - 32 mmol/L   Hemoglobin 13.6 13.0 - 17.0 g/dL   HCT 40.0 39.0 - 52.0 %  I-STAT, chem 8     Status: Abnormal   Collection Time: 09/01/17 11:46 AM  Result Value Ref Range   Sodium 140 135 - 145 mmol/L   Potassium 3.7 3.5 - 5.1 mmol/L   Chloride 98 (L) 101 - 111 mmol/L   BUN 25 (H) 6 - 20 mg/dL   Creatinine, Ser 7.50 (H) 0.61 - 1.24 mg/dL   Glucose, Bld 122 (H) 65 - 99 mg/dL   Calcium, Ion 1.10 (L) 1.15 - 1.40 mmol/L   TCO2 29 22 - 32 mmol/L   Hemoglobin 11.6 (L) 13.0 - 17.0 g/dL   HCT 34.0 (L) 39.0 - 52.0 %  I-STAT 3, arterial blood gas (G3+)     Status: Abnormal   Collection Time: 09/01/17 11:55 AM  Result Value Ref Range   pH, Arterial 7.424 7.350 - 7.450   pCO2 arterial 45.6 32.0 - 48.0 mmHg   pO2, Arterial 267.0 (H) 83.0 - 108.0 mmHg   Bicarbonate 29.8 (H) 20.0 - 28.0 mmol/L   TCO2 31 22 - 32 mmol/L   O2 Saturation 100.0 %   Acid-Base Excess 5.0 (H) 0.0 - 2.0 mmol/L   Patient temperature HIDE    Sample type ARTERIAL   Aerobic/Anaerobic Culture (surgical/deep wound)     Status: None (Preliminary result)   Collection Time: 09/01/17 12:20 PM  Result Value Ref Range   Specimen Description AORTIC VALVE TISSUE    Special Requests NONE    Gram Stain NO WBC SEEN NO ORGANISMS SEEN     Culture      NO GROWTH 1 DAY Performed at [facility], [address], [place],  [address]    Report Status PENDING   I-STAT, chem 8     Status: Abnormal   Collection Time: 09/01/17 12:46 PM  Result Value Ref Range   Sodium 139 135 - 145 mmol/L   Potassium 3.8 3.5 - 5.1  mmol/L   Chloride  101 101 - 111 mmol/L   BUN 26 (H) 6 - 20 mg/dL   Creatinine, Ser 8.20 (H) 0.61 - 1.24 mg/dL   Glucose, Bld 183 (H) 65 - 99 mg/dL   Calcium, Ion 1.06 (L) 1.15 - 1.40 mmol/L   TCO2 27 22 - 32 mmol/L   Hemoglobin 11.2 (L) 13.0 - 17.0 g/dL   HCT 33.0 (L) 39.0 - 52.0 %  Hemoglobin and hematocrit, blood     Status: Abnormal   Collection Time: 09/01/17  1:30 PM  Result Value Ref Range   Hemoglobin 10.1 (L) 13.0 - 17.0 g/dL    Comment: REPEATED TO VERIFY RESULT CALLED TO, READ BACK BY AND VERIFIED WITH: CHRISTINE MCCARTY,RN AT 1349 09/01/17 BY ZBEECH.    HCT 33.7 (L) 39.0 - 52.0 %    Comment: Performed at [facility], [place] [address] [place], [place] [address]  Platelet count     Status: None   Collection Time: 09/01/17  1:30 PM  Result Value Ref Range   Platelets 294 150 - 400 K/uL    Comment: Performed at [facility], [place] [address], [place], [place] [address]  Fibrinogen     Status: None   Collection Time: 09/01/17  1:30 PM  Result Value Ref Range   Fibrinogen 468 210 - 475 mg/dL    Comment: Performed at [facility] [address], [place], [place] [address]  I-STAT, [place] 8     Status: Abnormal   Collection Time: 09/01/17  1:36 PM  Result Value Ref Range   Sodium 140 135 - 145 mmol/L   Potassium 3.9 3.5 - 5.1 mmol/L   Chloride 102 101 - 111 mmol/L   BUN 24 (H) 6 - 20 mg/dL   Creatinine, Ser 7.70 (H) 0.61 - 1.24 mg/dL   Glucose, Bld 153 (H) 65 - 99 mg/dL   Calcium, Ion 1.06 (L) 1.15 - 1.40 mmol/L   TCO2 25 22 - 32 mmol/L   Hemoglobin 10.9 (L) 13.0 - 17.0 g/dL   HCT 32.0 (L) 39.0 - 52.0 %  Prepare Pheresed Platelets     Status: None   Collection Time: 09/01/17  2:01 PM  Result Value Ref Range   Unit Number [id number]    Blood Component Type PLTP LR1 PAS    Unit division 00    Status of Unit ISSUED,FINAL    Transfusion Status      OK TO TRANSFUSE Performed at [facility], [address] [place], [place] [address]    Unit  Number [id number]    Blood Component Type PLTPHER LR2    Unit division 00    Status of Unit ISSUED,FINAL    Transfusion Status OK TO TRANSFUSE   Prepare fresh frozen plasma     Status: None   Collection Time: 09/01/17  2:03 PM  Result Value Ref Range   Unit Number [id number]    Blood Component Type THAWED PLASMA    Unit division 00    Status of Unit ISSUED,FINAL    Transfusion Status      OK TO TRANSFUSE Performed at [facility], [place] [address] [place],  [address]    Unit Number [id number]    Blood Component Type THAWED PLASMA    Unit division 00    Status of Unit ISSUED,FINAL    Transfusion Status OK TO TRANSFUSE   I-STAT, chem 8     Status: Abnormal   Collection Time: 09/01/17  3:10  PM  Result Value Ref Range   Sodium 142 135 - 145 mmol/L   Potassium 4.2 3.5 - 5.1 mmol/L   Chloride 107 101 - 111 mmol/L   BUN 26 (H) 6 - 20 mg/dL   Creatinine, Ser 7.20 (H) 0.61 - 1.24 mg/dL   Glucose, Bld 123 (H) 65 - 99 mg/dL   Calcium, Ion 1.16 1.15 - 1.40 mmol/L   TCO2 22 22 - 32 mmol/L   Hemoglobin 10.2 (L) 13.0 - 17.0 g/dL   HCT 30.0 (L) 39.0 - 52.0 %  I-STAT 4, (NA,K, GLUC, HGB,HCT)     Status: Abnormal   Collection Time: 09/01/17  5:16 PM  Result Value Ref Range   Sodium 143 135 - 145 mmol/L   Potassium 4.6 3.5 - 5.1 mmol/L   Glucose, Bld 116 (H) 65 - 99 mg/dL   HCT 34.0 (L) 39.0 - 52.0 %   Hemoglobin 11.6 (L) 13.0 - 17.0 g/dL  I-STAT 3, arterial blood gas (G3+)     Status: Abnormal   Collection Time: 09/01/17  5:35 PM  Result Value Ref Range   pH, Arterial 7.320 (L) 7.350 - 7.450   pCO2 arterial 50.4 (H) 32.0 - 48.0 mmHg   pO2, Arterial 122.0 (H) 83.0 - 108.0 mmHg   Bicarbonate 26.3 20.0 - 28.0 mmol/L   TCO2 28 22 - 32 mmol/L   O2 Saturation 99.0 %   Acid-base deficit 1.0 0.0 - 2.0 mmol/L   Patient temperature 35.8 C    Sample type ARTERIAL   Glucose, capillary     Status: Abnormal   Collection Time: 09/01/17  6:05 PM  Result Value Ref Range    Glucose-Capillary 113 (H) 65 - 99 mg/dL   Comment 1 Arterial Specimen   Glucose, capillary     Status: Abnormal   Collection Time: 09/01/17  7:14 PM  Result Value Ref Range   Glucose-Capillary 110 (H) 65 - 99 mg/dL   Comment 1 Arterial Specimen   CBC     Status: Abnormal   Collection Time: 09/01/17  7:32 PM  Result Value Ref Range   WBC 15.5 (H) 4.0 - 10.5 K/uL   RBC 4.46 4.22 - 5.81 MIL/uL   Hemoglobin 11.2 (L) 13.0 - 17.0 g/dL   HCT 37.5 (L) 39.0 - 52.0 %   MCV 84.1 78.0 - 100.0 fL   MCH 25.1 (L) 26.0 - 34.0 pg   MCHC 29.9 (L) 30.0 - 36.0 g/dL   RDW 15.1 11.5 - 15.5 %   Platelets 286 150 - 400 K/uL    Comment: Performed at [facility], [place] [address] [place], [place] [address]  Protime-INR     Status: Abnormal   Collection Time: 09/01/17  7:32 PM  Result Value Ref Range   Prothrombin Time 16.9 (H) 11.4 - 15.2 seconds   INR 1.39     Comment: Performed at [facility] [address], [place], [place] [address]  APTT     Status: Abnormal   Collection Time: 09/01/17  7:32 PM  Result Value Ref Range   aPTT 39 (H) 24 - 36 seconds    Comment:        IF BASELINE aPTT IS ELEVATED, SUGGEST PATIENT RISK ASSESSMENT BE USED TO DETERMINE APPROPRIATE ANTICOAGULANT THERAPY. Performed at [facility], [place] [address], [place], [place] [address]   Glucose, capillary     Status: Abnormal   Collection Time: 09/01/17  8:06 PM  Result Value Ref Range  Glucose-Capillary 126 (H) 65 - 99 mg/dL   Comment 1 Arterial Specimen   Creatinine, serum     Status: Abnormal   Collection Time: 09/01/17  8:15 PM  Result Value Ref Range   Creatinine, Ser 8.47 (H) 0.61 - 1.24 mg/dL   GFR calc non Af Amer 6 (L) >60 mL/min   GFR calc Af Amer 7 (L) >60 mL/min    Comment: (NOTE) The eGFR has been calculated using the CKD EPI equation. This calculation has not been validated in all clinical situations. eGFR's persistently <60 mL/min signify possible Chronic Kidney Disease. Performed at  [facility], [place] [address], [place], [place] [address]   Magnesium     Status: None   Collection Time: 09/01/17  8:15 PM  Result Value Ref Range   Magnesium 2.1 1.7 - 2.4 mg/dL    Comment: Performed at [facility] [address], [place], [place] [address]  Glucose, capillary     Status: Abnormal   Collection Time: 09/01/17  9:03 PM  Result Value Ref Range   Glucose-Capillary 129 (H) 65 - 99 mg/dL   Comment 1 Arterial Specimen   Glucose, capillary     Status: Abnormal   Collection Time: 09/01/17 10:05 PM  Result Value Ref Range   Glucose-Capillary 129 (H) 65 - 99 mg/dL   Comment 1 Arterial Specimen   Glucose, capillary     Status: Abnormal   Collection Time: 09/01/17 10:54 PM  Result Value Ref Range   Glucose-Capillary 125 (H) 65 - 99 mg/dL   Comment 1 Arterial Specimen   I-STAT, chem 8     Status: Abnormal   Collection Time: 09/01/17 10:57 PM  Result Value Ref Range   Sodium 141 135 - 145 mmol/L   Potassium 4.9 3.5 - 5.1 mmol/L   Chloride 106 101 - 111 mmol/L   BUN 26 (H) 6 - 20 mg/dL   Creatinine, Ser 7.40 (H) 0.61 - 1.24 mg/dL   Glucose, Bld 131 (H) 65 - 99 mg/dL   Calcium, Ion 1.18 1.15 - 1.40 mmol/L   TCO2 23 22 - 32 mmol/L   Hemoglobin 11.2 (L) 13.0 - 17.0 g/dL   HCT 33.0 (L) 39.0 - 52.0 %  Magnesium     Status: None   Collection Time: 09/01/17 11:00 PM  Result Value Ref Range   Magnesium 1.9 1.7 - 2.4 mg/dL    Comment: Performed at [facility], [place] [address], [place], [place] [address]  CBC     Status: Abnormal   Collection Time: 09/01/17 11:00 PM  Result Value Ref Range   WBC 11.3 (H) 4.0 - 10.5 K/uL   RBC 4.39 4.22 - 5.81 MIL/uL   Hemoglobin 11.0 (L) 13.0 - 17.0 g/dL   HCT 36.2 (L) 39.0 - 52.0 %   MCV 82.5 78.0 - 100.0 fL   MCH 25.1 (L) 26.0 - 34.0 pg   MCHC 30.4 30.0 - 36.0 g/dL   RDW 14.6 11.5 - 15.5 %   Platelets 273 150 - 400 K/uL    Comment: Performed at [facility] [address], [place], [place] [address]   Creatinine, serum     Status: Abnormal   Collection Time: 09/01/17 11:00 PM  Result Value Ref Range   Creatinine, Ser 8.70 (H) 0.61 - 1.24 mg/dL   GFR calc non Af Amer 6 (L) >60 mL/min   GFR calc Af Amer 7 (L) >60 mL/min    Comment: (NOTE)  The eGFR has been calculated using the CKD EPI equation. This calculation has not been validated in all clinical situations. eGFR's persistently <60 mL/min signify possible Chronic Kidney Disease. Performed at [facility], [place] [address], [place], [place] [address]   Glucose, capillary     Status: Abnormal   Collection Time: 09/01/17 11:54 PM  Result Value Ref Range   Glucose-Capillary 124 (H) 65 - 99 mg/dL   Comment 1 Arterial Specimen   Glucose, capillary     Status: Abnormal   Collection Time: 09/02/17 12:46 AM  Result Value Ref Range   Glucose-Capillary 121 (H) 65 - 99 mg/dL   Comment 1 Arterial Specimen   Glucose, capillary     Status: Abnormal   Collection Time: 09/02/17  1:46 AM  Result Value Ref Range   Glucose-Capillary 128 (H) 65 - 99 mg/dL   Comment 1 Arterial Specimen   Glucose, capillary     Status: Abnormal   Collection Time: 09/02/17  2:43 AM  Result Value Ref Range   Glucose-Capillary 132 (H) 65 - 99 mg/dL   Comment 1 Arterial Specimen   Blood gas, arterial     Status: Abnormal   Collection Time: 09/02/17  3:40 AM  Result Value Ref Range   FIO2 50.00    Delivery systems VENTILATOR    Mode SYNCRONIZED INTERMITTENT MANDATORY VENTILATION    VT 620 mL   LHR 12 resp/min   Peep/cpap 5.0 cm H20   pH, Arterial 7.364 7.350 - 7.450   pCO2 arterial 40.6 32.0 - 48.0 mmHg   pO2, Arterial 124 (H) 83.0 - 108.0 mmHg   Bicarbonate 22.6 20.0 - 28.0 mmol/L   Acid-base deficit 2.0 0.0 - 2.0 mmol/L   O2 Saturation 98.3 %   Patient temperature 98.6    Collection site A-LINE    Drawn by DRAWN BY RN    Sample type ARTERIAL DRAW   Glucose, capillary     Status: Abnormal   Collection Time: 09/02/17  3:44 AM  Result Value Ref  Range   Glucose-Capillary 120 (H) 65 - 99 mg/dL   Comment 1 Arterial Specimen   CBC     Status: Abnormal   Collection Time: 09/02/17  3:45 AM  Result Value Ref Range   WBC 10.3 4.0 - 10.5 K/uL   RBC 4.41 4.22 - 5.81 MIL/uL   Hemoglobin 10.9 (L) 13.0 - 17.0 g/dL   HCT 36.0 (L) 39.0 - 52.0 %   MCV 81.6 78.0 - 100.0 fL   MCH 24.7 (L) 26.0 - 34.0 pg   MCHC 30.3 30.0 - 36.0 g/dL   RDW 14.7 11.5 - 15.5 %   Platelets 271 150 - 400 K/uL    Comment: Performed at [facility], [address], [place], [place] [address]  Basic metabolic panel     Status: Abnormal   Collection Time: 09/02/17  3:45 AM  Result Value Ref Range   Sodium 140 135 - 145 mmol/L   Potassium 5.7 (H) 3.5 - 5.1 mmol/L   Chloride 107 101 - 111 mmol/L   CO2 22 22 - 32 mmol/L   Glucose, Bld 131 (H) 65 - 99 mg/dL   BUN 31 (H) 6 - 20 mg/dL   Creatinine, Ser 8.29 (H) 0.61 - 1.24 mg/dL   Calcium 8.8 (L) 8.9 - 10.3 mg/dL   GFR calc non Af Amer 7 (L) >60 mL/min   GFR calc Af Amer 8 (L) >60 mL/min    Comment: (NOTE)  The eGFR has been calculated using the CKD EPI equation. This calculation has not been validated in all clinical situations. eGFR's persistently <60 mL/min signify possible Chronic Kidney Disease.    Anion gap 11 5 - 15    Comment: Performed at [facility] [address] [place],  [address]  Magnesium     Status: None   Collection Time: 09/02/17  3:45 AM  Result Value Ref Range   Magnesium 2.0 1.7 - 2.4 mg/dL    Comment: Performed at [facility] [address], [place], [place] [address]  Glucose, capillary     Status: Abnormal   Collection Time: 09/02/17  4:45 AM  Result Value Ref Range   Glucose-Capillary 122 (H) 65 - 99 mg/dL   Comment 1 Arterial Specimen   Glucose, capillary     Status: Abnormal   Collection Time: 09/02/17  5:37 AM  Result Value Ref Range   Glucose-Capillary 126 (H) 65 - 99 mg/dL   Comment 1 Arterial Specimen   Glucose, capillary     Status: Abnormal    Collection Time: 09/02/17  6:33 AM  Result Value Ref Range   Glucose-Capillary 109 (H) 65 - 99 mg/dL   Comment 1 Arterial Specimen   Glucose, capillary     Status: Abnormal   Collection Time: 09/02/17  8:19 AM  Result Value Ref Range   Glucose-Capillary 124 (H) 65 - 99 mg/dL  Glucose, capillary     Status: Abnormal   Collection Time: 09/02/17  9:23 AM  Result Value Ref Range   Glucose-Capillary 121 (H) 65 - 99 mg/dL  Cooxemetry Panel (carboxy, met, total hgb, O2 sat)     Status: Abnormal   Collection Time: 09/02/17  9:58 AM  Result Value Ref Range   Total hemoglobin 10.5 (L) 12.0 - 16.0 g/dL   O2 Saturation 82.2 %   Carboxyhemoglobin 1.2 0.5 - 1.5 %   Methemoglobin 1.4 0.0 - 1.5 %  Glucose, capillary     Status: Abnormal   Collection Time: 09/02/17 10:34 AM  Result Value Ref Range   Glucose-Capillary 124 (H) 65 - 99 mg/dL  Glucose, capillary     Status: Abnormal   Collection Time: 09/02/17 11:36 AM  Result Value Ref Range   Glucose-Capillary 115 (H) 65 - 99 mg/dL  Glucose, capillary     Status: Abnormal   Collection Time: 09/02/17 12:15 PM  Result Value Ref Range   Glucose-Capillary 109 (H) 65 - 99 mg/dL     ROS:  Review of systems not obtained due to patient factors.  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  No heparin for now given  post op and all dialysate fluids.  Using femoral vascath  2. Hypertension/volume  - hemodynamically  unstable for now- CVP reading 7-9 keeping even 3. Hyperkalemia- follow q 12- on all 4 K dialysate 4. Anemia  - not bad for now- supportive care- normally gets iron q week as OP and no ESA 5. Bones- normally on parsabiv and hectorol- once converted to IHD will add those meds back. I dont see binder on med list    Dorrian Doggett A 09/02/2017, 1:25 PM

## 2017-09-02 NOTE — Progress Notes (Signed)
Went to turn patient at 0200 and patient became very anxious, attempting to cough out ETT. Frequent ectopy and blood pressure drop noted during event. 2 mg morphine and 2 mg versed given. ETT secured and patient stabilized. Wean delayed. Will continue to monitor patient closely.

## 2017-09-02 NOTE — Progress Notes (Signed)
Patient ID: [name] [name], male   DOB: 12-14-1965, 52 y.o.   MRN: [id number] EVENING ROUNDS NOTE :     [address]       [place] [address]             [phone]                 1 Day Post-Op Procedure(s) (LRB): AORTIC VALVE REPLACEMENT (AVR) (N/A) CORONARY ARTERY BYPASS GRAFTING (CABG) X 1, USING RIGHT GREATER SAPHENOUS VEIN HARVESTED ENDOSCOPICALLY (N/A) TRANSESOPHAGEAL ECHOCARDIOGRAM (TEE) (N/A) FLOROSCOPY GUIDED PLACEMENT OF RIGHT FEMORAL CENTRAL LINE TIMES TWO  AND PLACEMENT OF SWAN GANZ CATHETER (Right) PLACEMENT OF TRIALYSIS SHORT TERM DIALYSIS CATHETER  Total Length of Stay:  LOS: 1 day  BP (!) 95/57   Pulse (!) 101   Temp 97.7 F (36.5 C)   Resp (!) 25   Ht 6' (1.829 m)   Wt (!) 337 lb 8.4 oz (153.1 kg)   SpO2 98%   BMI 45.78 kg/m   .Intake/Output      05/18 0701 - 05/19 0700   I.V. (mL/kg) 816.1 (5.3)   Blood    IV Piggyback 100   Total Intake(mL/kg) 916.1 (6)   Urine (mL/kg/hr)    Drains    Other 378   Blood    Chest Tube 200   Total Output 578   Net +338.1         . sodium chloride 10 mL/hr at 09/02/17 0700  . sodium chloride    . sodium chloride Stopped (09/02/17 0700)  . sodium chloride    . calcium gluconate infusion for CRRT    . cefUROXime (ZINACEF)  IV Stopped (09/02/17 1107)  . dexmedetomidine (PRECEDEX) IV infusion 0.7 mcg/kg/hr (09/02/17 1819)  . insulin (NOVOLIN-R) infusion 2 Units/hr (09/02/17 1900)  . lactated ringers    . lactated ringers    . lactated ringers 10 mL/hr at 09/02/17 0700  . nitroGLYCERIN Stopped (09/01/17 1702)  . norepinephrine (LEVOPHED) Adult infusion 24 mcg/min (09/02/17 1106)  . phenylephrine (NEO-SYNEPHRINE) Adult infusion Stopped (09/01/17 1900)  . dialysate (PRISMASATE)    . dialysis replacement fluid (prismasate) 300 mL/hr at 09/02/17 1040  . dialysis replacement fluid (prismasate) 350 mL/hr at 09/02/17 1039  . sodium citrate 2 %/dextrose 2.5% solution 3000 mL    . vasopressin (PITRESSIN)  infusion - *FOR SHOCK* 0.04 Units/min (09/02/17 9798)     Lab Results  Component Value Date   WBC 14.0 (H) 09/02/2017   HGB 11.2 (L) 09/02/2017   HCT 33.0 (L) 09/02/2017   PLT 264 09/02/2017   GLUCOSE 125 (H) 09/02/2017   CHOL 169 05/16/2013   TRIG 97 05/16/2013   HDL 35 (L) 05/16/2013   LDLCALC 115 (H) 05/16/2013   ALT 9 (L) 08/21/2017   AST 15 08/21/2017   NA 140 09/02/2017   K 5.2 (H) 09/02/2017   CL 105 09/02/2017   CREATININE 7.60 (H) 09/02/2017   BUN 30 (H) 09/02/2017   CO2 21 (L) 09/02/2017   TSH 1.66 05/16/2013   INR 1.39 09/01/2017   HGBA1C 5.0 08/21/2017   Cox 82.2 Milrinone off  22 levophed -  Try to get 50 ml hr off , wait to wean vent until fluid balance negative   Grace Isaac MD  Beeper [phone] Office [phone] 09/02/2017 7:35 PM

## 2017-09-02 NOTE — Anesthesia Postprocedure Evaluation (Signed)
Anesthesia Post Note  Patient: [name] [name]  Procedure(s) Performed: AORTIC VALVE REPLACEMENT (AVR) (N/A Chest) CORONARY ARTERY BYPASS GRAFTING (CABG) X 1, USING RIGHT GREATER SAPHENOUS VEIN HARVESTED ENDOSCOPICALLY (N/A Chest) TRANSESOPHAGEAL ECHOCARDIOGRAM (TEE) (N/A ) FLOROSCOPY GUIDED PLACEMENT OF RIGHT FEMORAL CENTRAL LINE TIMES TWO  AND PLACEMENT OF SWAN GANZ CATHETER (Right Groin) PLACEMENT OF TRIALYSIS SHORT TERM DIALYSIS CATHETER     Patient location during evaluation: SICU Anesthesia Type: General Level of consciousness: sedated Pain management: pain level controlled Vital Signs Assessment: post-procedure vital signs reviewed and stable Respiratory status: patient remains intubated per anesthesia plan Cardiovascular status: stable Postop Assessment: no apparent nausea or vomiting Anesthetic complications: no    Last Vitals:  Vitals:   09/02/17 1245 09/02/17 1248  BP:  (!) 95/57  Pulse: 89 90  Resp: 20 (!) 25  Temp: (!) 36.3 C   SpO2: 97% 99%    Last Pain:  Vitals:   09/02/17 0400  TempSrc: Core  PainSc:                  Siah Steely

## 2017-09-02 NOTE — Consult Note (Addendum)
Advanced Heart Failure Team Consult Note   Primary Physician: System, Provider Not In PCP-Cardiologist:  Kate Sable, MD  Reason for Consultation: Pressor requirement post-cardiac surgery  HPI:    [name] [name] is seen today for evaluation of CHF/cardiogenic shock at the request of Dr. Roxy Manns.   52 yo with valvular heart disease, CAD, ESRD, and SVT is now s/p CABG-mechanical AVR.  Patient has long history of ESRD, presumed due to HTN.  He was admitted in 4/19 with acute CHF.  Echo was done, showing EF 20-25% with the appearance of severe AI.  TEE confirmed EF 25-30% with severe AI/mild AS and moderate MR.  RHC/LHC showed serial 80% stenoses in the ostial and proximal LCx.  Volume was controlled with dialysis.  Patient has a long history of sinus tachycardia and has tolerated cardiac medications poorly due to low BP with HD.  He was started on ivabradine and low dose Toprol. XL.  He was noted to have runs of SVT with rates in the 140s-150s.  Patient was stabilized medically then referred for surgical evaluation.  On 09/01/17, he had mechanical AVR + SVG-OM.  Post-op, he remains intubated.  His CXR today is relatively clear with some atelectasis (personally reviewed).  He is a-paced and v-sensed.  He has a substantial pressor requirement currently with norepinephrine 24, vasopressin 0.04, and also milrinone 0.15.  Co-ox 82%, CI 2.8 from [place].  CVP 8-10 on my measurement, but weight up considerably post-op.  He has been started on CVVH but not pulling fluid due to marginal BP (currently SBP in 90s on support).     Review of Systems:  All systems reviewed and negative except as per HPI  Home Medications Prior to Admission medications   Medication Sig Start Date End Date Taking? Authorizing Provider  atorvastatin (LIPITOR) 40 MG tablet Take 1 tablet (40 mg total) by mouth daily at 6 PM. 08/03/17  Yes Eugenie Filler, MD  B Complex-C-Folic Acid (NEPHRO-VITE PO) Take 1 tablet by mouth daily.     Yes [provider]  chlorhexidine (PERIDEX) 0.12 % solution Rinse with 15 mls twice daily for 30 seconds. Use after breakfast and at bedtime. Spit out excess. Do not swallow. Patient taking differently: Use as directed 15 mLs in the mouth or throat 4 (four) times daily. For 30 seconds. Use after breakfast and at bedtime. Spit out excess. Do not swallow. 08/14/17  Yes [name], DDS  FOSRENOL 500 MG chewable tablet Chew 1,500-2,000 mg by mouth See admin instructions. Take 2000 mg by mouth with meals and take 1500 mg by mouth with snacks 05/28/13  Yes [provider]  ivabradine (CORLANOR) 5 MG TABS tablet Take 1 tablet (5 mg total) by mouth 2 (two) times daily with a meal. Patient taking differently: Take 5 mg by mouth daily.  08/09/17  Yes Larey Dresser, MD  metoprolol succinate (TOPROL-XL) 50 MG 24 hr tablet Take 1 tablet (50 mg total) by mouth daily. 08/14/17  Yes Larey Dresser, MD  midodrine (PROAMATINE) 5 MG tablet Take 5 mg by mouth See admin instructions. Take 5 mg by mouth daily on Tuesday, Thursday and Saturday. 02/09/16  Yes [provider]  oxyCODONE-acetaminophen (PERCOCET) 5-325 MG tablet Take one or two tablets by mouth every 6 hours as needed for pain. Patient taking differently: Take 1 tablet by mouth every 4 (four) hours as needed for severe pain.  08/07/17  Yes [name], DDS  benzonatate (TESSALON) 100  MG capsule 2 po tid for cough 07/14/17   Lily Kocher, PA-C  nitroGLYCERIN (NITROSTAT) 0.4 MG SL tablet Place 1 tablet (0.4 mg total) under the tongue every 5 (five) minutes as needed. For chest Patient taking differently: Place 0.4 mg under the tongue every 5 (five) minutes as needed for chest pain.  05/30/13   Sharmon Revere    Past Medical History: 1. ESRD: TTS HD.  2. H/o SVT 3. CAD: LHC (4/19) with 40% mLAD, 95% ostial small D2, 70% mid small D3, 80% ostial/80% proximal LCx.  S/p CABG with SVG-OM on 09/01/17.  4. Chronic  systolic CHF: TEE (5/46) with EF 25-30%, diffuse hypokinesis, trileaflet and calcified aortic valve with severe AI/moderate AS, moderate MR.  - RHC (4/19): mean RA 11, PA 53/24, mean PCWP 26, CI 2.67, PVR 1.6 WU 5. Valvular heart disease: Severe AI/mild AS and moderate MR on 4/19 TEE.  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  - Mechanical AVR 09/01/17 6. H/o sinus tachycardia  Past Surgical History: Past Surgical History:  Procedure Laterality Date  . A/V FISTULAGRAM Left 06/30/2016   Procedure: A/V Fistulagram;  Surgeon: Algernon Huxley, MD;  Location: [place] [facility];  Service: Cardiovascular;  Laterality: Left;  . ARTERIOVENOUS GRAFT PLACEMENT    . AV FISTULA PLACEMENT    . AV FISTULA PLACEMENT Left 11/18/2015   Procedure: ARTERIOVENOUS (AV) FISTULA CREATION ( RADIOCEPHALIC );  Surgeon: Algernon Huxley, MD;  Location: [facility];  Service: Vascular;  Laterality: Left;  . AV FISTULA PLACEMENT Left 03/23/2016   Procedure: ARTERIOVENOUS (AV) FISTULA CREATION ( REVISION );  Surgeon: Algernon Huxley, MD;  Location: [facility];  Service: Vascular;  Laterality: Left;  . [place] REMOVAL Left 03/23/2016   Procedure: REMOVAL OF ARTERIOVENOUS GORETEX GRAFT ([place]);  Surgeon: Algernon Huxley, MD;  Location: [facility];  Service: Vascular;  Laterality: Left;  . DIALYSIS/PERMA CATHETER REMOVAL N/A 08/01/2016   Procedure: Dialysis/Perma Catheter Removal;  Surgeon: Algernon Huxley, MD;  Location: [place] [facility];  Service: Cardiovascular;  Laterality: N/A;  . HERNIA REPAIR    . INSERTION OF DIALYSIS CATHETER  03/01/2011   Procedure: INSERTION OF DIALYSIS CATHETER;  Surgeon: Elam Dutch, MD;  Location: [place];  Service: Vascular;  Laterality: Left;  Exchange of Dialysis Catheter to 27cm 15Fr. Arrow Catheter  . MULTIPLE EXTRACTIONS WITH ALVEOLOPLASTY N/A 08/07/2017   Procedure: Extraction of tooth #2, 7,8,13,14,15,17, and 31 with alveoloplasty and gross debridement of remaining teeth;  Surgeon: [name], DDS;  Location: [place];  Service: Oral Surgery;  Laterality: N/A;  . PERIPHERAL VASCULAR CATHETERIZATION N/A 09/01/2014   Procedure: A/V Shuntogram/Fistulagram;  Surgeon: Algernon Huxley, MD;  Location: [place] [facility];  Service: Cardiovascular;  Laterality: N/A;  . PERIPHERAL VASCULAR CATHETERIZATION Right 09/01/2014   Procedure: Thrombectomy;  Surgeon: Algernon Huxley, MD;  Location: [place] [facility];  Service: Cardiovascular;  Laterality: Right;  . PERIPHERAL VASCULAR CATHETERIZATION Right 09/01/2014   Procedure: A/V Shunt Intervention;  Surgeon: Algernon Huxley, MD;  Location: [place] [facility];  Service: Cardiovascular;  Laterality: Right;  . PERIPHERAL VASCULAR CATHETERIZATION N/A 09/17/2014   Procedure: A/V Shuntogram/Fistulagram;  Surgeon: Algernon Huxley, MD;  Location: [place] [facility];  Service: Cardiovascular;  Laterality: N/A;  . PERIPHERAL VASCULAR CATHETERIZATION Right 10/17/2014   Procedure: Thrombectomy;  Surgeon: Katha Cabal, MD;  Location: [place] [facility];  Service: Cardiovascular;  Laterality: Right;  . PERIPHERAL VASCULAR  CATHETERIZATION N/A 10/17/2014   Procedure: A/V Shuntogram/Fistulagram;  Surgeon: Katha Cabal, MD;  Location: [place] [facility];  Service: Cardiovascular;  Laterality: N/A;  . PERIPHERAL VASCULAR CATHETERIZATION N/A 10/17/2014   Procedure: A/V Shunt Intervention;  Surgeon: Katha Cabal, MD;  Location: [place] [facility];  Service: Cardiovascular;  Laterality: N/A;  . PERIPHERAL VASCULAR CATHETERIZATION Right 11/04/2014   Procedure: Thrombectomy;  Surgeon: Katha Cabal, MD;  Location: [place] [facility];  Service: Cardiovascular;  Laterality: Right;  . PERIPHERAL VASCULAR CATHETERIZATION Left 11/04/2014   Procedure: Visceral Venography;  Surgeon: Katha Cabal, MD;  Location: [place] [facility];  Service: Cardiovascular;  Laterality: Left;  . PERIPHERAL VASCULAR CATHETERIZATION Right 11/27/2014   Procedure: A/V  Shuntogram/Fistulagram;  Surgeon: Algernon Huxley, MD;  Location: [place] [facility];  Service: Cardiovascular;  Laterality: Right;  . PERIPHERAL VASCULAR CATHETERIZATION Right 11/27/2014   Procedure: A/V Shunt Intervention;  Surgeon: Algernon Huxley, MD;  Location: [place] [facility];  Service: Cardiovascular;  Laterality: Right;  . PERIPHERAL VASCULAR CATHETERIZATION Right 12/31/2014   Procedure: Thrombectomy;  Surgeon: Algernon Huxley, MD;  Location: [place] [facility];  Service: Cardiovascular;  Laterality: Right;  . PERIPHERAL VASCULAR CATHETERIZATION Right 01/12/2015   Procedure: A/V Shuntogram/Fistulagram;  Surgeon: Algernon Huxley, MD;  Location: [place] [facility];  Service: Cardiovascular;  Laterality: Right;  . PERIPHERAL VASCULAR CATHETERIZATION N/A 01/12/2015   Procedure: A/V Shunt Intervention;  Surgeon: Algernon Huxley, MD;  Location: [place] [facility];  Service: Cardiovascular;  Laterality: N/A;  . PERIPHERAL VASCULAR CATHETERIZATION Right 01/28/2015   Procedure: Thrombectomy;  Surgeon: Algernon Huxley, MD;  Location: [place] [facility];  Service: Cardiovascular;  Laterality: Right;  . PERIPHERAL VASCULAR CATHETERIZATION N/A 06/08/2015   Procedure: A/V Shuntogram/Fistulagram;  Surgeon: Algernon Huxley, MD;  Location: [place] [facility];  Service: Cardiovascular;  Laterality: N/A;  . PERIPHERAL VASCULAR CATHETERIZATION N/A 06/08/2015   Procedure: A/V Shunt Intervention;  Surgeon: Algernon Huxley, MD;  Location: [place] [facility];  Service: Cardiovascular;  Laterality: N/A;  . PERIPHERAL VASCULAR CATHETERIZATION Right 07/06/2015   Procedure: A/V Shuntogram/Fistulagram;  Surgeon: Algernon Huxley, MD;  Location: [place] [facility];  Service: Cardiovascular;  Laterality: Right;  . PERIPHERAL VASCULAR CATHETERIZATION N/A 07/06/2015   Procedure: A/V Shunt Intervention;  Surgeon: Algernon Huxley, MD;  Location: [place] [facility];  Service: Cardiovascular;  Laterality: N/A;  . PERIPHERAL VASCULAR CATHETERIZATION  N/A 08/04/2015   Procedure: graft declot;  Surgeon: Katha Cabal, MD;  Location: [place] [facility];  Service: Cardiovascular;  Laterality: N/A;  . PERIPHERAL VASCULAR CATHETERIZATION Right 08/25/2015   Procedure: A/V Shuntogram/Fistulagram;  Surgeon: Katha Cabal, MD;  Location: [place] [facility];  Service: Cardiovascular;  Laterality: Right;  . PERIPHERAL VASCULAR CATHETERIZATION N/A 11/04/2015   Procedure: Dialysis/Perma Catheter Insertion;  Surgeon: Algernon Huxley, MD;  Location: [place] [facility];  Service: Cardiovascular;  Laterality: N/A;  . PERIPHERAL VASCULAR CATHETERIZATION Left 12/14/2015   Procedure: A/V Shuntogram/Fistulagram;  Surgeon: Algernon Huxley, MD;  Location: [place] [facility];  Service: Cardiovascular;  Laterality: Left;  . PERIPHERAL VASCULAR CATHETERIZATION N/A 12/14/2015   Procedure: A/V Shunt Intervention;  Surgeon: Algernon Huxley, MD;  Location: [place] [facility];  Service: Cardiovascular;  Laterality: N/A;  . PERIPHERAL VASCULAR CATHETERIZATION N/A 12/17/2015   Procedure: Dialysis/Perma Catheter Insertion;  Surgeon: Algernon Huxley, MD;  Location: [place] [facility];  Service: Cardiovascular;  Laterality: N/A;  . PERIPHERAL VASCULAR CATHETERIZATION Left 12/22/2015   Procedure: Dialysis/Perma Catheter Insertion;  Surgeon: Katha Cabal, MD;  Location: [place] [facility];  Service: Cardiovascular;  Laterality: Left;  . PERIPHERAL VASCULAR CATHETERIZATION Left 02/29/2016   Procedure: A/V Shuntogram/Fistulagram;  Surgeon: Algernon Huxley, MD;  Location: [place] [facility];  Service: Cardiovascular;  Laterality: Left;  . PERIPHERAL VASCULAR CATHETERIZATION N/A 02/29/2016   Procedure: A/V Shunt Intervention;  Surgeon: Algernon Huxley, MD;  Location: [place] [facility];  Service: Cardiovascular;  Laterality: N/A;  . right arm graft     for dyalisis  . RIGHT/LEFT HEART CATH AND CORONARY ANGIOGRAPHY N/A 08/02/2017   Procedure: RIGHT/LEFT HEART CATH AND CORONARY  ANGIOGRAPHY;  Surgeon: Larey Dresser, MD;  Location: [place] [facility];  Service: Cardiovascular;  Laterality: N/A;  . TEE WITHOUT CARDIOVERSION N/A 07/31/2017   Procedure: TRANSESOPHAGEAL ECHOCARDIOGRAM (TEE);  Surgeon: Josue Hector, MD;  Location: [facility];  Service: Cardiovascular;  Laterality: N/A;  . THROMBECTOMY    . THROMBECTOMY W/ EMBOLECTOMY  03/01/2011   Procedure: THROMBECTOMY ARTERIOVENOUS GORE-TEX GRAFT;  Surgeon: Elam Dutch, MD;  Location: [place];  Service: Vascular;  Laterality: Right;  Attempted Thrombectomy of Old  Right Upper Arm Arteriovenous gortex Graft. Insertion of new Arteriovenous Graft using 26m x 50cm Gortex Stretch graft.   . THROMBECTOMY W/ EMBOLECTOMY  07/11/2011   Procedure: THROMBECTOMY ARTERIOVENOUS GORE-TEX GRAFT;  Surgeon: TRosetta Posner MD;  [place]  Service: Vascular;  Laterality: Right;  . UMBILICAL HERNIA REPAIR    . VENOGRAM N/A 08/23/2011   Procedure: VENOGRAM;  Surgeon: VSerafina Mitchell MD;  [facility];  Service: Cardiovascular;  Laterality: N/A;    Family History: Family History  Problem Relation Age of Onset  . Hypertension Mother   . Heart disease Mother   . Hypertension Father   . Heart disease Father     Social History: Social History   Socioeconomic History  . Marital status: Married    Spouse name: Not on file  . Number of children: Not on file  . Years of education: Not on file  . Highest education level: Not on file  Occupational History  . Occupation: disabled  Social Needs  . Financial resource strain: Not on file  . Food insecurity:    Worry: Not on file    Inability: Not on file  . Transportation needs:    Medical: Not on file    Non-medical: Not on file  Tobacco Use  . Smoking status: Former Smoker    Types: Cigarettes    Last attempt to quit: 04/18/1994    Years since quitting: 23.3  . Smokeless tobacco: Current User    Types: Snuff  . Tobacco comment: dips 1/2 snuff per day times 25  years  Substance and Sexual Activity  . Alcohol use: No  . Drug use: No  . Sexual activity: Not on file  Lifestyle  . Physical activity:    Days per week: Not on file    Minutes per session: Not on file  . Stress: Not on file  Relationships  . Social connections:    Talks on phone: Not on file    Gets together: Not on file    Attends religious service: Not on file    Active member of club or organization: Not on file    Attends meetings of clubs or organizations: Not on file    Relationship status: Not on  file  Other Topics Concern  . Not on file  Social History Narrative  . Not on file    Allergies:  No Known Allergies  Objective:    Vital Signs:   Temp:  [96.1 F (35.6 C)-99 F (37.2 C)] 96.6 F (35.9 C) (05/18 1615) Pulse Rate:  [46-105] 89 (05/18 1615) Resp:  [6-32] 24 (05/18 1615) BP: (78-104)/(45-78) 95/57 (05/18 1248) SpO2:  [94 %-100 %] 97 % (05/18 1615) Arterial Line BP: (77-114)/(42-67) 97/56 (05/18 1615) FiO2 (%):  [50 %] 50 % (05/18 1248) Weight:  [337 lb 8.4 oz (153.1 kg)] 337 lb 8.4 oz (153.1 kg) (05/18 0400) Last BM Date: (pta)  Weight change: Filed Weights   09/01/17 0611 09/02/17 0400  Weight: (!) 308 lb (139.7 kg) (!) 337 lb 8.4 oz (153.1 kg)    Intake/Output:   Intake/Output Summary (Last 24 hours) at 09/02/2017 1708 Last data filed at 09/02/2017 1600 Gross per 24 hour  Intake 4896.09 ml  Output 783 ml  Net 4113.09 ml      Physical Exam    General:  Intubated, awake.  HEENT: normal Neck: supple. JVP 8-9 cm. Carotids 2+ bilat; no bruits. No lymphadenopathy or thyromegaly appreciated. Cor: PMI nonpalpable. Regular rate & rhythm. Mechanical S2.  1/6 SEM RUSB.  Lungs: Decreased BS at bases.  Abdomen: soft, nontender, nondistended. No hepatosplenomegaly. No bruits or masses. Good bowel sounds. Extremities: no cyanosis, clubbing, rash. 1+ ankle edema.  Neuro: alert & orientedx3, cranial nerves grossly intact. moves all 4 extremities w/o  difficulty. Affect pleasant   Telemetry   A-paced, v-sensed in 80s (personally reviewed)  Labs   Basic Metabolic Panel: Recent Labs  Lab 09/01/17 0632  09/01/17 1336 09/01/17 1510 09/01/17 1716 09/01/17 2015 09/01/17 2257 09/01/17 2300 09/02/17 0345 09/02/17 1604  NA 139   < > 140 142 143  --  141  --  140 140  K 3.9   < > 3.9 4.2 4.6  --  4.9  --  5.7* 5.2*  CL 97*   < > 102 107  --   --  106  --  107 105  CO2 28  --   --   --   --   --   --   --  22  --   GLUCOSE 85   < > 153* 123* 116*  --  131*  --  131* 125*  BUN 26*   < > 24* 26*  --   --  26*  --  31* 30*  CREATININE 8.78*   < > 7.70* 7.20*  --  8.47* 7.40* 8.70* 8.29* 7.60*  CALCIUM 9.5  --   --   --   --   --   --   --  8.8*  --   MG  --   --   --   --   --  2.1  --  1.9 2.0  --    < > = values in this interval not displayed.    Liver Function Tests: No results for input(s): AST, ALT, ALKPHOS, BILITOT, PROT, ALBUMIN in the last 168 hours. No results for input(s): LIPASE, AMYLASE in the last 168 hours. No results for input(s): AMMONIA in the last 168 hours.  CBC: Recent Labs  Lab 09/01/17 0632  09/01/17 1330  09/01/17 1932 09/01/17 2257 09/01/17 2300 09/02/17 0345 09/02/17 1604  WBC 6.8  --   --   --  15.5*  --  11.3* 10.3  --  HGB 14.0   < > 10.1*   < > 11.2* 11.2* 11.0* 10.9* 11.2*  HCT 46.7   < > 33.7*   < > 37.5* 33.0* 36.2* 36.0* 33.0*  MCV 82.2  --   --   --  84.1  --  82.5 81.6  --   PLT 291  --  294  --  286  --  273 271  --    < > = values in this interval not displayed.    Cardiac Enzymes: No results for input(s): CKTOTAL, CKMB, CKMBINDEX, TROPONINI in the last 168 hours.  BNP: BNP (last 3 results) Recent Labs    07/28/17 1831  BNP 1,098.0*    ProBNP (last 3 results) No results for input(s): PROBNP in the last 8760 hours.   CBG: Recent Labs  Lab 09/02/17 1136 09/02/17 1215 09/02/17 1259 09/02/17 1408 09/02/17 1506  GLUCAP 115* 109* 117* 124* 120*    Coagulation  Studies: Recent Labs    09/01/17 1932  LABPROT 16.9*  INR 1.39     Imaging   Dg Chest Port 1 View  Result Date: 09/02/2017 CLINICAL DATA:  Intubation, aortic valve replacement and CABG. EXAM: PORTABLE CHEST 1 VIEW COMPARISON:  09/02/2017. FINDINGS: Endotracheal tube terminates 5.6 cm above the carina. Nasogastric tube is followed into the stomach. Mediastinal drain and bilateral chest tubes in place. Inferior approach Swan-Ganz catheter appears looped in the left pulmonary artery. Cardiomediastinal silhouette is stable. Lungs are low in volume with scattered atelectasis, basilar predominant. No definite pleural fluid. Difficult to exclude trace right pleural air. IMPRESSION: 1. Swan-Ganz catheter remains looped in the left pulmonary artery. Clinical service was made aware of this earlier today, per the comparison chest x-ray report 09/02/2017. 2. Difficult to exclude trace right pleural air with right chest tube in place. 3. No definite left pneumothorax. 4. Low lung volumes basilar predominant atelectasis. Electronically Signed   By: Lorin Picket M.D.   On: 09/02/2017 08:13   Dg Chest Port 1 View  Result Date: 09/02/2017 CLINICAL DATA:  Atelectasis EXAM: PORTABLE CHEST 1 VIEW COMPARISON:  Yesterday FINDINGS: Higher endotracheal tube, tip at C7-T1. An orogastric tube reaches the stomach. A Swan-Ganz catheter is looped at the level of the main and left pulmonary arteries. Thoracic drains in place. Low lung volumes with atelectasis. No edema or pneumothorax. Artifact from EKG leads. These results will be called to the ordering clinician or representative by the Radiologist Assistant, and communication documented in the PACS or zVision Dashboard. IMPRESSION: 1. Higher endotracheal tube with tip at C7-T1. Consider advancement by 4-5 cm. 2. Remaining hardware appears stable, including looping of the Swan-Ganz catheter. 3. Low volume chest with atelectasis. Electronically Signed   By: Monte Fantasia  M.D.   On: 09/02/2017 08:06   Dg Chest Port 1 View  Result Date: 09/01/2017 CLINICAL DATA:  Status post CABG. EXAM: PORTABLE CHEST 1 VIEW COMPARISON:  Chest x-ray from yesterday. FINDINGS: Endotracheal tube in position with the tip 6.4 cm above the level of the carina. Enteric tube tip in the stomach. Femoral approach Swan-Ganz catheter with the tip in the pulmonary outflow tract. Mediastinal and bilateral chest tubes are in good position. Interval CABG. Cardiomegaly. Low lung volumes with bronchovascular crowding. Bilateral perihilar opacities are not significantly changed. No significant pleural effusion. No pneumothorax. No acute osseous abnormality. IMPRESSION: 1. Status post CABG. Bilateral perihilar opacities are unchanged and could reflect bronchovascular crowding due to low lung volumes or pulmonary edema. Electronically Signed  By: Titus Dubin M.D.   On: 09/01/2017 17:31      Medications:     Current Medications: . acetaminophen  1,000 mg Oral Q6H   Or  . acetaminophen (TYLENOL) oral liquid 160 mg/5 mL  1,000 mg Per Tube Q6H  . aspirin EC  325 mg Oral Daily   Or  . aspirin  324 mg Per Tube Daily  . atorvastatin  40 mg Per Tube q1800  . bisacodyl  10 mg Oral Daily   Or  . bisacodyl  10 mg Rectal Daily  . chlorhexidine  15 mL Mouth/Throat NOW  . chlorhexidine gluconate (MEDLINE KIT)  15 mL Mouth Rinse BID  . Chlorhexidine Gluconate Cloth  6 each Topical Daily  . docusate sodium  200 mg Oral Daily  . insulin regular  0-10 Units Intravenous TID WC  . mouth rinse  15 mL Mouth Rinse 10 times per day  . [START ON 09/03/2017] pantoprazole  40 mg Oral Daily  . sodium chloride flush  10-40 mL Intracatheter Q12H  . sodium chloride flush  3 mL Intravenous Q12H     Infusions: . sodium chloride 10 mL/hr at 09/02/17 0700  . sodium chloride    . sodium chloride Stopped (09/02/17 0700)  . sodium chloride    . cefUROXime (ZINACEF)  IV Stopped (09/02/17 1107)  . dexmedetomidine  (PRECEDEX) IV infusion 0.7 mcg/kg/hr (09/02/17 1152)  . insulin (NOVOLIN-R) infusion 1.8 Units/hr (09/02/17 1600)  . lactated ringers    . lactated ringers    . lactated ringers 10 mL/hr at 09/02/17 0700  . milrinone 0.15 mcg/kg/min (09/02/17 1253)  . nitroGLYCERIN Stopped (09/01/17 1702)  . norepinephrine (LEVOPHED) Adult infusion 24 mcg/min (09/02/17 1106)  . phenylephrine (NEO-SYNEPHRINE) Adult infusion Stopped (09/01/17 1900)  . dialysis replacement fluid (prismasate) 300 mL/hr at 09/02/17 1040  . dialysis replacement fluid (prismasate) 350 mL/hr at 09/02/17 1039  . dialysate (PRISMASATE) 1,500 mL/hr at 09/02/17 1438  . vasopressin (PITRESSIN) infusion - *FOR SHOCK* 0.04 Units/min (09/02/17 6301)      Assessment/Plan   1. 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 No active vegetation seen. Blood cultures were negative during 4/19 hospitalization. Severe AI could certainly potentiate CHF. This was not see on 2012 echo. The LV is not significantly dilated, so not sure AI is the only explanation for his cardiomyopathy. He is now s/p mechanical AVR.  - Continue ASA, will need to start warfarin per cardiac surgery's direction.  - Will need eventual post-op echo.  2. Acute on chronic systolic CHF/cardiogenic shock: Echo 4/19 with EF 20-25%. As LV is not markedly dilated, I am not sure that aortic insufficiency can be invoked as the only cause for cardiomyopathy. He has had chronic sinus tachycardia x years but HR has been in 100s-110s, do not think this would have been likely to cause a tachy-mediated CMP. He has serial 80% stenoses in the LCx, but do not think this can explain all of the cardiomyopathy either. It is possible that cardiomyopathy is multifactorial. Post-op, he has been hypotensive.  However, at baseline his BP is marginal and he requires  midodrine with HD. He has good cardiac output by co-ox and thermodilution from [place], but remains on norepinephrine and vasopressin (also on low dose milrinone). CVP 8-10 on my measurement but weight is up post-op (may be significant 3rd-spacing). No fever, WBCs not significantly elevated, CXR ok =>  do not think septic shock.  - Would add midodrine to help wean pressors.  - On only low dose milrinone with good output, will stop milrinone (may be potentiating low BP in ESRD patient).   - Gentle fluid removal via CVVH, 50 cc/hr or so.  3. ESRD:TTS HD, now on CVVH.  4. SVT: H/o SVT, have not seen this admission.  5. CAD: S/p SVG-OM.   - Add statin.   Length of Stay: 1  Loralie Champagne, MD  09/02/2017, 5:08 PM  Advanced Heart Failure Team Pager [phone] (M-F; 7a - 4p)  Please contact [facility] for night-coverage after hours (4p -7a ) and weekends on [email or web address]

## 2017-09-02 NOTE — Progress Notes (Signed)
Was informed of ESRD pt here for valve replacement- hemodynamically unstable and K of 5.7- will put in orders for CRRT and write full consult later today   Caylah Plouff A

## 2017-09-02 NOTE — Progress Notes (Signed)
Received a call that CRRT filter clotted 4 hours after started. Will add local citrate anticoagulation to the circuit, this will not anticoagulate systemically.    Kelly Splinter MD pgr [phone]   09/02/2017, 5:11 PM

## 2017-09-02 NOTE — Progress Notes (Signed)
Notified Dr. Jonnie Finner re: CRRT filter clotting (started at 1120 today).  Dr. Aundra Dubin at bedside, decision made to initiate citrate.

## 2017-09-02 NOTE — Progress Notes (Signed)
Dr. Servando Snare notified of pt's heart rhythm SVT 160 on bedside ekg.  Orders received.  Will closely monitor.

## 2017-09-02 NOTE — Progress Notes (Signed)
Initial Nutrition Assessment  DOCUMENTATION CODES:  Morbid obesity  INTERVENTION:  If unable to extubate within 24-48 hrs, recommend initiation of TF.   Given he is on CRRT, Recommend Vital High Protein at 55 ml/h (1320 ml per day) and Prostat 30 ml TID to provide 1620 kcals, 161 gm protein, 1071 ml free water daily.   NUTRITION DIAGNOSIS:  Inadequate oral intake related to inability to eat as evidenced by NPO status.  GOAL:  Provide needs based on ASPEN/SCCM guidelines  MONITOR:  Diet advancement, Vent status, Labs, Weight trends, TF tolerance  REASON FOR ASSESSMENT:  Ventilator    ASSESSMENT:  52 y/o male PMHx HTN, dHF, ESRD on HD, morbid obesity, CAD. Presented for ELECTIVE AV replacement and CABGx1 5/17 d/t AI and CAD. Postop, remains intubated d/t hemodynamic instability/hypotension. Started on CRRT.  Patient seen with wife at bedside. She reports that prior to elective surgery, the patient was eating well. He reportedly follows a low sodium/Renal diet at home. Wife denies pt having any recent n/v/c/d.   Weight wise, she says his recent weight at HD has been 308 lbs. The patient nods his head to hearing this. However, he does appear to have been weighed at [facility] recently and was 317lbs 12.8 oz. Will use this objective measurement given his current edema/fluid status.   Utilization of ASPEN guidelines for obese given very wide range for estimated needs. Given he has severe HF/mordid obesity, will opt to use lower amount TF to lessen free water amount and meet protein needs w/ use of modulars.   Physical Exam: No discernible muscle/fat wasting. Mild-moderate generalized swelling.   Patient is currently intubated on ventilator support MV: 13.5 L/min Temp (24hrs), Avg:97.7 F (36.5 C), Min:96.1 F (35.6 C), Max:99 F (37.2 C) Propofol: None Labs: Bgs: 100-130, K:5.7, Most recent phos on 4/17 was 6.4 Meds: Pressor/inotrope Support: Vasopressin, Levo,  Milrinone Sedation/Analgesia:Precedex Infusions: IVF, insulin GTT, IV abx  Recent Labs  Lab 09/01/17 0632  09/01/17 1510 09/01/17 1716 09/01/17 2015 09/01/17 2257 09/01/17 2300 09/02/17 0345  NA 139   < > 142 143  --  141  --  140  K 3.9   < > 4.2 4.6  --  4.9  --  5.7*  CL 97*   < > 107  --   --  106  --  107  CO2 28  --   --   --   --   --   --  22  BUN 26*   < > 26*  --   --  26*  --  31*  CREATININE 8.78*   < > 7.20*  --  8.47* 7.40* 8.70* 8.29*  CALCIUM 9.5  --   --   --   --   --   --  8.8*  MG  --   --   --   --  2.1  --  1.9 2.0  GLUCOSE 85   < > 123* 116*  --  131*  --  131*   < > = values in this interval not displayed.   NUTRITION - FOCUSED PHYSICAL EXAM: WDL. Obese. Mild-mod edema.   Diet Order:   Diet Order           Diet NPO time specified  Diet effective now         EDUCATION NEEDS:  No education needs have been identified at this time  Skin: Surgical Incision to Chest/ R Leg  Last BM:  Unknown  Height:  Ht Readings from Last 1 Encounters:  09/01/17 6' (1.829 m)   Weight:  Wt Readings from Last 1 Encounters:  09/02/17 (!) 337 lb 8.4 oz (153.1 kg)   Wt Readings from Last 10 Encounters:  09/02/17 (!) 337 lb 8.4 oz (153.1 kg)  08/21/17 (!) 320 lb 6.4 oz (145.3 kg)  08/21/17 (!) 317 lb 12.8 oz (144.2 kg)  08/14/17 (!) 314 lb 12 oz (142.8 kg)  08/07/17 (!) 314 lb (142.4 kg)  08/03/17 (!) (P) 316 lb 12.8 oz (143.7 kg)  07/20/17 (!) 326 lb (147.9 kg)  07/14/17 (!) 327 lb (148.3 kg)  08/24/16 (!) 340 lb (154.2 kg)  06/30/16 (!) 339 lb (153.8 kg)   Ideal Body Weight:  80.91 kg  BMI:  Body mass index using recent dry wt is 43.1 kg/m.  Using dry weight 144.2 kg Estimated Nutritional Needs:  Kcal:  1585-2020 kcals (11-14 kcal/kg dry bw) Protein:  >162 g (2g/kg ibw) Fluid:  Per MD goals  Burtis Junes RD, LDN, CNSC Clinical Nutrition Available Tues-Sat via Pager: [phone] 09/02/2017 2:00 PM

## 2017-09-03 ENCOUNTER — Other Ambulatory Visit: Payer: Self-pay

## 2017-09-03 ENCOUNTER — Inpatient Hospital Stay (HOSPITAL_COMMUNITY): Payer: Medicare Other

## 2017-09-03 LAB — POCT I-STAT, CHEM 8
BUN: 20 mg/dL (ref 6–20)
BUN: 22 mg/dL — ABNORMAL HIGH (ref 6–20)
BUN: 27 mg/dL — ABNORMAL HIGH (ref 6–20)
BUN: 28 mg/dL — ABNORMAL HIGH (ref 6–20)
BUN: 29 mg/dL — ABNORMAL HIGH (ref 6–20)
BUN: 19 mg/dL (ref 6–20)
BUN: 20 mg/dL (ref 6–20)
BUN: 26 mg/dL — AB (ref 6–20)
BUN: 29 mg/dL — ABNORMAL HIGH (ref 6–20)
BUN: 20 mg/dL (ref 6–20)
BUN: 23 mg/dL — AB (ref 6–20)
CALCIUM ION: 1.18 mmol/L (ref 1.15–1.40)
CALCIUM ION: 1.23 mmol/L (ref 1.15–1.40)
CALCIUM ION: 1.24 mmol/L (ref 1.15–1.40)
CALCIUM ION: 1.17 mmol/L (ref 1.15–1.40)
CHLORIDE: 104 mmol/L (ref 101–111)
CHLORIDE: 104 mmol/L (ref 101–111)
CREATININE: 4.3 mg/dL — AB (ref 0.61–1.24)
CREATININE: 6.5 mg/dL — AB (ref 0.61–1.24)
CREATININE: 3.5 mg/dL — AB (ref 0.61–1.24)
CREATININE: 4.5 mg/dL — AB (ref 0.61–1.24)
Calcium, Ion: 0.33 mmol/L — CL (ref 1.15–1.40)
Calcium, Ion: 0.34 mmol/L — CL (ref 1.15–1.40)
Calcium, Ion: 1.19 mmol/L (ref 1.15–1.40)
Calcium, Ion: 1.21 mmol/L (ref 1.15–1.40)
Calcium, Ion: 0.33 mmol/L — CL (ref 1.15–1.40)
Calcium, Ion: 0.34 mmol/L — CL (ref 1.15–1.40)
Calcium, Ion: 0.54 mmol/L — CL (ref 1.15–1.40)
Chloride: 101 mmol/L (ref 101–111)
Chloride: 103 mmol/L (ref 101–111)
Chloride: 105 mmol/L (ref 101–111)
Chloride: 99 mmol/L — ABNORMAL LOW (ref 101–111)
Chloride: 100 mmol/L — ABNORMAL LOW (ref 101–111)
Chloride: 101 mmol/L (ref 101–111)
Chloride: 106 mmol/L (ref 101–111)
Chloride: 100 mmol/L — ABNORMAL LOW (ref 101–111)
Chloride: 98 mmol/L — ABNORMAL LOW (ref 101–111)
Creatinine, Ser: 3.7 mg/dL — ABNORMAL HIGH (ref 0.61–1.24)
Creatinine, Ser: 6.8 mg/dL — ABNORMAL HIGH (ref 0.61–1.24)
Creatinine, Ser: 7.4 mg/dL — ABNORMAL HIGH (ref 0.61–1.24)
Creatinine, Ser: 4.2 mg/dL — ABNORMAL HIGH (ref 0.61–1.24)
Creatinine, Ser: 6.3 mg/dL — ABNORMAL HIGH (ref 0.61–1.24)
Creatinine, Ser: 6.7 mg/dL — ABNORMAL HIGH (ref 0.61–1.24)
Creatinine, Ser: 5.7 mg/dL — ABNORMAL HIGH (ref 0.61–1.24)
GLUCOSE: 145 mg/dL — AB (ref 65–99)
Glucose, Bld: 135 mg/dL — ABNORMAL HIGH (ref 65–99)
Glucose, Bld: 144 mg/dL — ABNORMAL HIGH (ref 65–99)
Glucose, Bld: 191 mg/dL — ABNORMAL HIGH (ref 65–99)
Glucose, Bld: 202 mg/dL — ABNORMAL HIGH (ref 65–99)
Glucose, Bld: 136 mg/dL — ABNORMAL HIGH (ref 65–99)
Glucose, Bld: 136 mg/dL — ABNORMAL HIGH (ref 65–99)
Glucose, Bld: 187 mg/dL — ABNORMAL HIGH (ref 65–99)
Glucose, Bld: 203 mg/dL — ABNORMAL HIGH (ref 65–99)
Glucose, Bld: 115 mg/dL — ABNORMAL HIGH (ref 65–99)
Glucose, Bld: 155 mg/dL — ABNORMAL HIGH (ref 65–99)
HCT: 31 % — ABNORMAL LOW (ref 39.0–52.0)
HCT: 32 % — ABNORMAL LOW (ref 39.0–52.0)
HCT: 37 % — ABNORMAL LOW (ref 39.0–52.0)
HCT: 31 % — ABNORMAL LOW (ref 39.0–52.0)
HEMATOCRIT: 30 % — AB (ref 39.0–52.0)
HEMATOCRIT: 30 % — AB (ref 39.0–52.0)
HEMATOCRIT: 32 % — AB (ref 39.0–52.0)
HEMATOCRIT: 34 % — AB (ref 39.0–52.0)
HEMATOCRIT: 36 % — AB (ref 39.0–52.0)
HEMATOCRIT: 37 % — AB (ref 39.0–52.0)
HEMATOCRIT: 38 % — AB (ref 39.0–52.0)
HEMOGLOBIN: 10.5 g/dL — AB (ref 13.0–17.0)
HEMOGLOBIN: 12.6 g/dL — AB (ref 13.0–17.0)
HEMOGLOBIN: 12.6 g/dL — AB (ref 13.0–17.0)
HEMOGLOBIN: 10.9 g/dL — AB (ref 13.0–17.0)
HEMOGLOBIN: 10.2 g/dL — AB (ref 13.0–17.0)
Hemoglobin: 10.2 g/dL — ABNORMAL LOW (ref 13.0–17.0)
Hemoglobin: 10.9 g/dL — ABNORMAL LOW (ref 13.0–17.0)
Hemoglobin: 10.5 g/dL — ABNORMAL LOW (ref 13.0–17.0)
Hemoglobin: 12.2 g/dL — ABNORMAL LOW (ref 13.0–17.0)
Hemoglobin: 12.9 g/dL — ABNORMAL LOW (ref 13.0–17.0)
Hemoglobin: 11.6 g/dL — ABNORMAL LOW (ref 13.0–17.0)
POTASSIUM: 4 mmol/L (ref 3.5–5.1)
POTASSIUM: 4.6 mmol/L (ref 3.5–5.1)
POTASSIUM: 4.8 mmol/L (ref 3.5–5.1)
Potassium: 4.3 mmol/L (ref 3.5–5.1)
Potassium: 4.5 mmol/L (ref 3.5–5.1)
Potassium: 4.7 mmol/L (ref 3.5–5.1)
Potassium: 4.2 mmol/L (ref 3.5–5.1)
Potassium: 4.3 mmol/L (ref 3.5–5.1)
Potassium: 4.4 mmol/L (ref 3.5–5.1)
Potassium: 5.2 mmol/L — ABNORMAL HIGH (ref 3.5–5.1)
Potassium: 4.2 mmol/L (ref 3.5–5.1)
SODIUM: 137 mmol/L (ref 135–145)
SODIUM: 138 mmol/L (ref 135–145)
SODIUM: 139 mmol/L (ref 135–145)
SODIUM: 139 mmol/L (ref 135–145)
SODIUM: 140 mmol/L (ref 135–145)
SODIUM: 141 mmol/L (ref 135–145)
SODIUM: 142 mmol/L (ref 135–145)
SODIUM: 142 mmol/L (ref 135–145)
Sodium: 139 mmol/L (ref 135–145)
Sodium: 139 mmol/L (ref 135–145)
Sodium: 141 mmol/L (ref 135–145)
TCO2: 22 mmol/L (ref 22–32)
TCO2: 22 mmol/L (ref 22–32)
TCO2: 22 mmol/L (ref 22–32)
TCO2: 23 mmol/L (ref 22–32)
TCO2: 22 mmol/L (ref 22–32)
TCO2: 22 mmol/L (ref 22–32)
TCO2: 23 mmol/L (ref 22–32)
TCO2: 25 mmol/L (ref 22–32)
TCO2: 26 mmol/L (ref 22–32)
TCO2: 22 mmol/L (ref 22–32)
TCO2: 22 mmol/L (ref 22–32)

## 2017-09-03 LAB — GLUCOSE, CAPILLARY
GLUCOSE-CAPILLARY: 130 mg/dL — AB (ref 65–99)
GLUCOSE-CAPILLARY: 127 mg/dL — AB (ref 65–99)
GLUCOSE-CAPILLARY: 126 mg/dL — AB (ref 65–99)
GLUCOSE-CAPILLARY: 127 mg/dL — AB (ref 65–99)
GLUCOSE-CAPILLARY: 177 mg/dL — AB (ref 65–99)
GLUCOSE-CAPILLARY: 147 mg/dL — AB (ref 65–99)
GLUCOSE-CAPILLARY: 132 mg/dL — AB (ref 65–99)
GLUCOSE-CAPILLARY: 102 mg/dL — AB (ref 65–99)
GLUCOSE-CAPILLARY: 90 mg/dL (ref 65–99)
GLUCOSE-CAPILLARY: 118 mg/dL — AB (ref 65–99)
GLUCOSE-CAPILLARY: 112 mg/dL — AB (ref 65–99)
GLUCOSE-CAPILLARY: 165 mg/dL — AB (ref 65–99)
GLUCOSE-CAPILLARY: 49 mg/dL — AB (ref 65–99)
Glucose-Capillary: 107 mg/dL — ABNORMAL HIGH (ref 65–99)
Glucose-Capillary: 108 mg/dL — ABNORMAL HIGH (ref 65–99)
Glucose-Capillary: 140 mg/dL — ABNORMAL HIGH (ref 65–99)
Glucose-Capillary: 140 mg/dL — ABNORMAL HIGH (ref 65–99)
Glucose-Capillary: 147 mg/dL — ABNORMAL HIGH (ref 65–99)
Glucose-Capillary: 155 mg/dL — ABNORMAL HIGH (ref 65–99)

## 2017-09-03 LAB — TSH: TSH: 1.67 u[IU]/mL (ref 0.350–4.500)

## 2017-09-03 LAB — RENAL FUNCTION PANEL
ALBUMIN: 2.4 g/dL — AB (ref 3.5–5.0)
ANION GAP: 13 (ref 5–15)
Albumin: 2.5 g/dL — ABNORMAL LOW (ref 3.5–5.0)
Anion gap: 13 (ref 5–15)
BUN: 31 mg/dL — ABNORMAL HIGH (ref 6–20)
BUN: 25 mg/dL — ABNORMAL HIGH (ref 6–20)
CALCIUM: 10.1 mg/dL (ref 8.9–10.3)
CHLORIDE: 103 mmol/L (ref 101–111)
CO2: 23 mmol/L (ref 22–32)
CO2: 24 mmol/L (ref 22–32)
Calcium: 9.9 mg/dL (ref 8.9–10.3)
Chloride: 102 mmol/L (ref 101–111)
Creatinine, Ser: 7.22 mg/dL — ABNORMAL HIGH (ref 0.61–1.24)
Creatinine, Ser: 6.11 mg/dL — ABNORMAL HIGH (ref 0.61–1.24)
GFR calc non Af Amer: 8 mL/min — ABNORMAL LOW (ref >60)
GFR calc non Af Amer: 9 mL/min — ABNORMAL LOW (ref >60)
GFR, EST AFRICAN AMERICAN: 9 mL/min — AB (ref >60)
GFR, EST AFRICAN AMERICAN: 11 mL/min — AB (ref >60)
Glucose, Bld: 137 mg/dL — ABNORMAL HIGH (ref 65–99)
Glucose, Bld: 117 mg/dL — ABNORMAL HIGH (ref 65–99)
PHOSPHORUS: 6.3 mg/dL — AB (ref 2.5–4.6)
PHOSPHORUS: 5.7 mg/dL — AB (ref 2.5–4.6)
POTASSIUM: 4.4 mmol/L (ref 3.5–5.1)
Potassium: 4.8 mmol/L (ref 3.5–5.1)
SODIUM: 139 mmol/L (ref 135–145)
SODIUM: 139 mmol/L (ref 135–145)

## 2017-09-03 LAB — CBC
HCT: 34.1 % — ABNORMAL LOW (ref 39.0–52.0)
Hemoglobin: 10.3 g/dL — ABNORMAL LOW (ref 13.0–17.0)
MCH: 24.6 pg — ABNORMAL LOW (ref 26.0–34.0)
MCHC: 30.2 g/dL (ref 30.0–36.0)
MCV: 81.6 fL (ref 78.0–100.0)
Platelets: 229 10*3/uL (ref 150–400)
RBC: 4.18 MIL/uL — ABNORMAL LOW (ref 4.22–5.81)
RDW: 15.1 % (ref 11.5–15.5)
WBC: 15 10*3/uL — ABNORMAL HIGH (ref 4.0–10.5)

## 2017-09-03 LAB — COOXEMETRY PANEL
Carboxyhemoglobin: 0.9 % (ref 0.5–1.5)
Methemoglobin: 1.4 % (ref 0.0–1.5)
O2 Saturation: 68.4 %
Total hemoglobin: 11.4 g/dL — ABNORMAL LOW (ref 12.0–16.0)

## 2017-09-03 LAB — POCT I-STAT 3, ART BLOOD GAS (G3+)
ACID-BASE DEFICIT: 1 mmol/L (ref 0.0–2.0)
Acid-base deficit: 1 mmol/L (ref 0.0–2.0)
Bicarbonate: 23.5 mmol/L (ref 20.0–28.0)
Bicarbonate: 23.5 mmol/L (ref 20.0–28.0)
O2 SAT: 97 %
O2 Saturation: 95 %
PCO2 ART: 36.4 mmHg (ref 32.0–48.0)
PH ART: 7.409 (ref 7.350–7.450)
PO2 ART: 76 mmHg — AB (ref 83.0–108.0)
Patient temperature: 34.8
TCO2: 25 mmol/L (ref 22–32)
TCO2: 25 mmol/L (ref 22–32)
pCO2 arterial: 35.5 mmHg (ref 32.0–48.0)
pH, Arterial: 7.42 (ref 7.350–7.450)
pO2, Arterial: 67 mmHg — ABNORMAL LOW (ref 83.0–108.0)

## 2017-09-03 LAB — PROTIME-INR
INR: 1.29
Prothrombin Time: 16 seconds — ABNORMAL HIGH (ref 11.4–15.2)

## 2017-09-03 LAB — HEPATIC FUNCTION PANEL
ALT: 11 U/L — ABNORMAL LOW (ref 17–63)
AST: 32 U/L (ref 15–41)
Albumin: 2.5 g/dL — ABNORMAL LOW (ref 3.5–5.0)
Alkaline Phosphatase: 71 U/L (ref 38–126)
Bilirubin, Direct: 0.2 mg/dL (ref 0.1–0.5)
Indirect Bilirubin: 0.5 mg/dL (ref 0.3–0.9)
Total Bilirubin: 0.7 mg/dL (ref 0.3–1.2)
Total Protein: 6 g/dL — ABNORMAL LOW (ref 6.5–8.1)

## 2017-09-03 LAB — MAGNESIUM: MAGNESIUM: 2 mg/dL (ref 1.7–2.4)

## 2017-09-03 LAB — DIGOXIN LEVEL: Digoxin Level: <0.2 ng/mL — ABNORMAL LOW (ref 0.8–2.0)

## 2017-09-03 MED ORDER — WARFARIN SODIUM 2.5 MG PO TABS
2.5000 mg | ORAL_TABLET | Freq: Once | ORAL | Status: AC
  Administered 2017-09-03: 2.5 mg via ORAL
  Filled 2017-09-03: qty 1

## 2017-09-03 MED ORDER — MIDODRINE HCL 5 MG PO TABS
10.0000 mg | ORAL_TABLET | Freq: Three times a day (TID) | ORAL | Status: DC
  Administered 2017-09-03 (×3): 10 mg
  Filled 2017-09-03 (×3): qty 2

## 2017-09-03 MED ORDER — WARFARIN - PHYSICIAN DOSING INPATIENT
Freq: Every day | Status: DC
  Administered 2017-09-08 – 2017-09-09 (×2)

## 2017-09-03 MED ORDER — ORAL CARE MOUTH RINSE
15.0000 mL | Freq: Two times a day (BID) | OROMUCOSAL | Status: DC
  Administered 2017-09-03 – 2017-09-04 (×2): 15 mL via OROMUCOSAL

## 2017-09-03 NOTE — Progress Notes (Signed)
  Amiodarone Drug - Drug Interaction Consult Note  Recommendations: None, monitor for now.  Amiodarone is metabolized by the cytochrome P450 system and therefore has the potential to cause many drug interactions. Amiodarone has an average plasma half-life of 50 days (range 20 to 100 days).   There is potential for drug interactions to occur several weeks or months after stopping treatment and the onset of drug interactions may be slow after initiating amiodarone.   [x]  Statins: Increased risk of myopathy. Simvastatin- restrict dose to 20mg  daily. Other statins: counsel patients to report any muscle pain or weakness immediately.  [x]  Anticoagulants: Amiodarone can increase anticoagulant effect. Consider warfarin dose reduction. Patients should be monitored closely and the dose of anticoagulant altered accordingly, remembering that amiodarone levels take several weeks to stabilize.  []  Antiepileptics: Amiodarone can increase plasma concentration of phenytoin, the dose should be reduced. Note that small changes in phenytoin dose can result in large changes in levels. Monitor patient and counsel on signs of toxicity.  []  Beta blockers: increased risk of bradycardia, AV block and myocardial depression. Sotalol - avoid concomitant use.  []   Calcium channel blockers (diltiazem and verapamil): increased risk of bradycardia, AV block and myocardial depression.  []   Cyclosporine: Amiodarone increases levels of cyclosporine. Reduced dose of cyclosporine is recommended.  []  Digoxin dose should be halved when amiodarone is started.  []  Diuretics: increased risk of cardiotoxicity if hypokalemia occurs.  []  Oral hypoglycemic agents (glyburide, glipizide, glimepiride): increased risk of hypoglycemia. Patient's glucose levels should be monitored closely when initiating amiodarone therapy.   []  Drugs that prolong the QT interval:  Torsades de pointes risk may be increased with concurrent use - avoid if  possible.  Monitor QTc, also keep magnesium/potassium WNL if concurrent therapy can't be avoided. [name] Antibiotics: e.g. fluoroquinolones, erythromycin. . Antiarrhythmics: e.g. quinidine, procainamide, disopyramide, sotalol. . Antipsychotics: e.g. phenothiazines, haloperidol.  . Lithium, tricyclic antidepressants, and methadone. Thank You,  Pat Patrick  09/03/2017 9:31 AM

## 2017-09-03 NOTE — Progress Notes (Signed)
Subjective:  Filter clotted- started on citrate regional anticoagulation- since has been OK- some tachy/SVT- still on norepi- hypothermic- CVP recorded at single digits   Objective Vital signs in last 24 hours: Vitals:   09/03/17 0400 09/03/17 0500 09/03/17 0600 09/03/17 0700  BP:      Pulse: (!) 103 (!) 41 98 98  Resp: 20 (!) 0 (!) 23 (!) 22  Temp: (!) 95.2 F (35.1 C) (!) 95 F (35 C) (!) 94.5 F (34.7 C) (!) 94.3 F (34.6 C)  TempSrc:      SpO2: 98% 97% 98% 98%  Weight:  (!) 151.5 kg (334 lb)    Height:       Weight change: -1.6 kg (-3 lb 8.4 oz)  Intake/Output Summary (Last 24 hours) at 09/03/2017 0719 Last data filed at 09/03/2017 0700 Gross per 24 hour  Intake 3240.74 ml  Output 2498 ml  Net 742.74 ml    HD orders- [facility]- 4 hours and 45 min TTS using left AVF 450/800- 2K/2.5 calc - EDW 140.5 2000 per hour of heparin/venofer 50 qTues/hect 12 mcg q tx/parsabiv 2.5 q tx   Assessment/Plan: 52 year old with ESRD [facility] - dec EF with CAD and AI-  S/p one vessel CABG and AVR on 5/17 1.Renal- ESRD- normally on IHD q TTS- for now with high K and hemodynamically unstable so doing CRRT.  No heparin for now given post op and all dialysate fluids.  Using femoral vascath.  Needed to start citrate regional anticoagulation- k normalized.  Continue to run even given low CVP  2. Hypertension/volume  - hemodynamically unstable for now- CVP reading single digits-  keeping even until p[ressors weaned- supposedly runs low BP as OP- have restarted here- may be able to have a lower target 3. Hyperkalemia- follow q 12- on all 4 K dialysate 4. Anemia  - not bad for now- supportive care- normally gets iron q week as OP and no ESA 5. Bones- normally on parsabiv and hectorol- once converted to IHD will add those meds back. I dont see binder on med list     Ricahrd Schwager A    Labs: Basic Metabolic Panel: Recent Labs  Lab 09/02/17 0345 09/02/17 1556   09/03/17 0205 09/03/17 0236 09/03/17 0420  NA 140 138   < > 141 139 139  K 5.7* 5.2*   < > 4.3 4.8 4.6  CL 107 105   < > 99* 103 103  CO2 22 21*  --   --  23  --   GLUCOSE 131* 125*   < > 202* 137* 135*  BUN 31* 33*   < > 20 31* 27*  CREATININE 8.29* 8.00*   < > 3.70* 7.22* 6.50*  CALCIUM 8.8* 9.0  --   --  9.9  --   PHOS  --  6.2*  --   --  6.3*  --    < > = values in this interval not displayed.   Liver Function Tests: Recent Labs  Lab 09/02/17 1556 09/03/17 0236  AST  --  32  ALT  --  11*  ALKPHOS  --  71  BILITOT  --  0.7  PROT  --  6.0*  ALBUMIN 2.7* 2.5*  2.5*   No results for input(s): LIPASE, AMYLASE in the last 168 hours. No results for input(s): AMMONIA in the last 168 hours. CBC: Recent Labs  Lab 09/01/17 1932  09/01/17 2300 09/02/17 0345 09/02/17 1556  09/03/17 0205 09/03/17  0236 09/03/17 0420  WBC 15.5*  --  11.3* 10.3 14.0*  --   --  15.0*  --   HGB 11.2*   < > 11.0* 10.9* 10.6*   < > 12.6* 10.3* 10.9*  HCT 37.5*   < > 36.2* 36.0* 35.0*   < > 37.0* 34.1* 32.0*  MCV 84.1  --  82.5 81.6 81.4  --   --  81.6  --   PLT 286  --  273 271 264  --   --  229  --    < > = values in this interval not displayed.   Cardiac Enzymes: No results for input(s): CKTOTAL, CKMB, CKMBINDEX, TROPONINI in the last 168 hours. CBG: Recent Labs  Lab 09/03/17 0159 09/03/17 0259 09/03/17 0421 09/03/17 0506 09/03/17 0608  GLUCAP 130* 155* 127* 126* 108*    Iron Studies: No results for input(s): IRON, TIBC, TRANSFERRIN, FERRITIN in the last 72 hours. Studies/Results: Dg Chest Port 1 View  Result Date: 09/02/2017 CLINICAL DATA:  Intubation, aortic valve replacement and CABG. EXAM: PORTABLE CHEST 1 VIEW COMPARISON:  09/02/2017. FINDINGS: Endotracheal tube terminates 5.6 cm above the carina. Nasogastric tube is followed into the stomach. Mediastinal drain and bilateral chest tubes in place. Inferior approach Swan-Ganz catheter appears looped in the left pulmonary artery.  Cardiomediastinal silhouette is stable. Lungs are low in volume with scattered atelectasis, basilar predominant. No definite pleural fluid. Difficult to exclude trace right pleural air. IMPRESSION: 1. Swan-Ganz catheter remains looped in the left pulmonary artery. Clinical service was made aware of this earlier today, per the comparison chest x-ray report 09/02/2017. 2. Difficult to exclude trace right pleural air with right chest tube in place. 3. No definite left pneumothorax. 4. Low lung volumes basilar predominant atelectasis. Electronically Signed   By: Lorin Picket M.D.   On: 09/02/2017 08:13   Dg Chest Port 1 View  Result Date: 09/02/2017 CLINICAL DATA:  Atelectasis EXAM: PORTABLE CHEST 1 VIEW COMPARISON:  Yesterday FINDINGS: Higher endotracheal tube, tip at C7-T1. An orogastric tube reaches the stomach. A Swan-Ganz catheter is looped at the level of the main and left pulmonary arteries. Thoracic drains in place. Low lung volumes with atelectasis. No edema or pneumothorax. Artifact from EKG leads. These results will be called to the ordering clinician or representative by the Radiologist Assistant, and communication documented in the PACS or zVision Dashboard. IMPRESSION: 1. Higher endotracheal tube with tip at C7-T1. Consider advancement by 4-5 cm. 2. Remaining hardware appears stable, including looping of the Swan-Ganz catheter. 3. Low volume chest with atelectasis. Electronically Signed   By: Monte Fantasia M.D.   On: 09/02/2017 08:06   Dg Chest Port 1 View  Result Date: 09/01/2017 CLINICAL DATA:  Status post CABG. EXAM: PORTABLE CHEST 1 VIEW COMPARISON:  Chest x-ray from yesterday. FINDINGS: Endotracheal tube in position with the tip 6.4 cm above the level of the carina. Enteric tube tip in the stomach. Femoral approach Swan-Ganz catheter with the tip in the pulmonary outflow tract. Mediastinal and bilateral chest tubes are in good position. Interval CABG. Cardiomegaly. Low lung volumes with  bronchovascular crowding. Bilateral perihilar opacities are not significantly changed. No significant pleural effusion. No pneumothorax. No acute osseous abnormality. IMPRESSION: 1. Status post CABG. Bilateral perihilar opacities are unchanged and could reflect bronchovascular crowding due to low lung volumes or pulmonary edema. Electronically Signed   By: Titus Dubin M.D.   On: 09/01/2017 17:31   Dg Chest Portable 1 View  Result Date: 09/01/2017  CLINICAL DATA:  Unsuccessful central line placement EXAM: PORTABLE CHEST 1 VIEW COMPARISON:  Portable exam 0858 hours compared to 09/01/2017 FINDINGS: Tip of endotracheal tube projects approximately 6.6 cm above carina. Tip of endoscope projects over mid esophagus. Low lung volumes. Enlargement of cardiac silhouette. Perihilar infiltrates greater on LEFT, new. No gross pleural effusion or pneumothorax. IMPRESSION: Low lung volumes with BILATERAL perihilar infiltrates greater on LEFT. No pneumothorax. Enlargement of cardiac silhouette. Electronically Signed   By: Lavonia Dana M.D.   On: 09/01/2017 09:21   Dg Fluoro Guide Cv Line-no Report  Result Date: 09/01/2017 Fluoroscopy was utilized by the requesting physician.  No radiographic interpretation.   Medications: Infusions: . sodium chloride 10 mL/hr at 09/03/17 0600  . sodium chloride 1 mL/hr at 09/03/17 0600  . sodium chloride Stopped (09/02/17 0700)  . sodium chloride    . amiodarone 30 mg/hr (09/03/17 0600)  . calcium gluconate infusion for CRRT 80 mL/hr at 09/03/17 0600  . cefUROXime (ZINACEF)  IV Stopped (09/02/17 2217)  . dexmedetomidine (PRECEDEX) IV infusion 0.7 mcg/kg/hr (09/03/17 6644)  . insulin (NOVOLIN-R) infusion 1.9 Units/hr (09/03/17 0600)  . lactated ringers    . lactated ringers    . lactated ringers 10 mL/hr at 09/03/17 0600  . nitroGLYCERIN Stopped (09/01/17 1702)  . norepinephrine (LEVOPHED) Adult infusion 20 mcg/min (09/03/17 0616)  . phenylephrine (NEO-SYNEPHRINE) Adult  infusion Stopped (09/01/17 1900)  . dialysate (PRISMASATE) 1,500 mL/hr at 09/03/17 0413  . dialysis replacement fluid (prismasate) 300 mL/hr at 09/02/17 1040  . dialysis replacement fluid (prismasate) 350 mL/hr at 09/03/17 0413  . sodium citrate 2 %/dextrose 2.5% solution 3000 mL 270 mL/hr at 09/02/17 2253  . vasopressin (PITRESSIN) infusion - *FOR SHOCK* 0.04 Units/min (09/03/17 0600)    Scheduled Medications: . acetaminophen  1,000 mg Oral Q6H   Or  . acetaminophen (TYLENOL) oral liquid 160 mg/5 mL  1,000 mg Per Tube Q6H  . amiodarone  150 mg Intravenous Once  . aspirin EC  325 mg Oral Daily   Or  . aspirin  324 mg Per Tube Daily  . atorvastatin  40 mg Per Tube q1800  . bisacodyl  10 mg Oral Daily   Or  . bisacodyl  10 mg Rectal Daily  . chlorhexidine gluconate (MEDLINE KIT)  15 mL Mouth Rinse BID  . Chlorhexidine Gluconate Cloth  6 each Topical Daily  . docusate sodium  200 mg Oral Daily  . insulin regular  0-10 Units Intravenous TID WC  . mouth rinse  15 mL Mouth Rinse 10 times per day  . midodrine  5 mg Per Tube TID WC  . pantoprazole  40 mg Oral Daily  . sodium chloride flush  10-40 mL Intracatheter Q12H  . sodium chloride flush  3 mL Intravenous Q12H    have reviewed scheduled and prn medications.  Physical Exam: General: sedated but arouses to stim Heart: tachy Lungs: mostly clear Abdomen: obese, soft Extremities: really no edema Dialysis Access: left AVF and new left femoral VC placed 5/17    09/03/2017,7:19 AM  LOS: 2 days

## 2017-09-03 NOTE — Procedures (Signed)
Extubation Procedure Note  Patient Details:   Name: [name] [name] DOB: 10-05-1965 MRN: [id number]   Airway Documentation:    Vent end date: 09/03/17 Vent end time: 1035   Evaluation  O2 sats: stable throughout Complications: No apparent complications Patient did tolerate procedure well. Bilateral Breath Sounds: Clear, Diminished   Yes   Positive cuff leak noted. NIF > - 40 VC 960 mL  Post extubation: Pt placed on Wink 4 L with humidity, no stridor noted. Pt able to reach 500 mL using incentive spirometer with coaching.     Mingo Amber Aleera Gilcrease 09/03/2017, 10:42 AM

## 2017-09-03 NOTE — Progress Notes (Signed)
Patient ID: [name] [name], male   DOB: 11/27/1965, 52 y.o.   MRN: [id number] EVENING ROUNDS NOTE :     [address]       [name] [address]             [phone]                 2 Days Post-Op Procedure(s) (LRB): AORTIC VALVE REPLACEMENT (AVR) (N/A) CORONARY ARTERY BYPASS GRAFTING (CABG) X 1, USING RIGHT GREATER SAPHENOUS VEIN HARVESTED ENDOSCOPICALLY (N/A) TRANSESOPHAGEAL ECHOCARDIOGRAM (TEE) (N/A) FLOROSCOPY GUIDED PLACEMENT OF RIGHT FEMORAL CENTRAL LINE TIMES TWO  AND PLACEMENT OF SWAN GANZ CATHETER (Right) PLACEMENT OF TRIALYSIS SHORT TERM DIALYSIS CATHETER  Total Length of Stay:  LOS: 2 days  BP 105/67   Pulse 83   Temp (!) 97 F (36.1 C)   Resp 15   Ht 6' (1.829 m)   Wt (!) 334 lb (151.5 kg)   SpO2 97%   BMI 45.30 kg/m   .Intake/Output      05/19 0701 - 05/20 0700   I.V. (mL/kg) 1861.6 (12.3)   NG/GT 20   IV Piggyback 100   Total Intake(mL/kg) 1981.6 (13.1)   Urine (mL/kg/hr)    Drains 5   Other 2580   Chest Tube 80   Total Output 2665   Net -683.4         . sodium chloride 10 mL/hr at 09/03/17 1800  . sodium chloride 1 mL/hr at 09/03/17 1800  . sodium chloride Stopped (09/02/17 0700)  . sodium chloride    . amiodarone 30 mg/hr (09/03/17 1800)  . calcium gluconate infusion for CRRT 80 mL/hr at 09/03/17 1800  . dexmedetomidine (PRECEDEX) IV infusion 0.301 mcg/kg/hr (09/03/17 1800)  . insulin (NOVOLIN-R) infusion 2.6 Units/hr (09/03/17 1818)  . lactated ringers    . lactated ringers    . lactated ringers 10 mL/hr at 09/03/17 1800  . nitroGLYCERIN Stopped (09/01/17 1702)  . norepinephrine (LEVOPHED) Adult infusion 20 mcg/min (09/03/17 1800)  . phenylephrine (NEO-SYNEPHRINE) Adult infusion Stopped (09/01/17 1900)  . dialysate (PRISMASATE) 1,500 mL/hr at 09/03/17 1820  . dialysis replacement fluid (prismasate) 350 mL/hr at 09/03/17 0413  . sodium citrate 2 %/dextrose 2.5% solution 3000 mL 310 mL/hr at 09/03/17 1610  . vasopressin  (PITRESSIN) infusion - *FOR SHOCK* 0.04 Units/min (09/03/17 1800)     Lab Results  Component Value Date   WBC 15.0 (H) 09/03/2017   HGB 11.6 (L) 09/03/2017   HCT 34.0 (L) 09/03/2017   PLT 229 09/03/2017   GLUCOSE 155 (H) 09/03/2017   CHOL 169 05/16/2013   TRIG 97 05/16/2013   HDL 35 (L) 05/16/2013   LDLCALC 115 (H) 05/16/2013   ALT 11 (L) 09/03/2017   AST 32 09/03/2017   NA 140 09/03/2017   K 4.0 09/03/2017   CL 98 (L) 09/03/2017   CREATININE 4.50 (H) 09/03/2017   BUN 20 09/03/2017   CO2 24 09/03/2017   TSH 1.670 09/03/2017   INR 1.29 09/03/2017   HGBA1C 5.0 08/21/2017   Stable today Tolerated extubation, breathing comfortable Now back in sinus on Cordarone    Grace Isaac MD  Beeper [phone] Office [phone] 09/03/2017 7:08 PM

## 2017-09-03 NOTE — Progress Notes (Signed)
Patient ID: [name] [name], male   DOB: 1965/08/29, 52 y.o.   MRN: [id number]     Advanced Heart Failure Rounding Note  PCP-Cardiologist: Kate Sable, MD   Subjective:    Patient remains intubated this morning, awakens on vent.  He is on norepinephrine 22, vasopressin 0.04.  CVVH ongoing, aiming to pull around 50 cc/hr.  He is now off milrinone.   Last night, he went into SVT at 160, started on amiodarone gtt.  He then went into atrial fibrillation in the 100s.   Afebrile.  Hgb stable.   Swan numbers: CVP 13 PA 32/21 CI 2.1 Co-ox 68%   Objective:   Weight Range: (!) 334 lb (151.5 kg) Body mass index is 45.3 kg/m.   Vital Signs:   Temp:  [94.3 F (34.6 C)-99 F (37.2 C)] 94.3 F (34.6 C) (05/19 0700) Pulse Rate:  [41-114] 98 (05/19 0700) Resp:  [0-32] 22 (05/19 0700) BP: (90-95)/(55-57) 95/57 (05/18 1248) SpO2:  [94 %-100 %] 98 % (05/19 0700) Arterial Line BP: (85-114)/(48-70) 108/69 (05/19 0700) FiO2 (%):  [40 %-50 %] 40 % (05/19 0328) Weight:  [334 lb (151.5 kg)] 334 lb (151.5 kg) (05/19 0500) Last BM Date: (prior to admission)  Weight change: Filed Weights   09/01/17 0611 09/02/17 0400 09/03/17 0500  Weight: (!) 308 lb (139.7 kg) (!) 337 lb 8.4 oz (153.1 kg) (!) 334 lb (151.5 kg)    Intake/Output:   Intake/Output Summary (Last 24 hours) at 09/03/2017 0731 Last data filed at 09/03/2017 0700 Gross per 24 hour  Intake 3240.74 ml  Output 2498 ml  Net 742.74 ml      Physical Exam    General:  Ventilated/sedated.  HEENT: Normal Neck: Supple. JVP 10 cm.  No lymphadenopathy or thyromegaly appreciated. Cor: PMI nondisplaced. Irregular rate & rhythm. Mechanical S2. No rubs, gallops.  1/6 SEM RUSB.  Lungs: Decreased BS at bases.  Abdomen: Soft, nontender, nondistended. No hepatosplenomegaly. No bruits or masses. Good bowel sounds. Extremities: No cyanosis, clubbing, rash.  Trace ankle edema.  Neuro: Awakens on vent, follows commands.    Telemetry    Atrial fibrillation, rate in 100s (personally reviewed).   Labs    CBC Recent Labs    09/02/17 1556  09/03/17 0236 09/03/17 0420  WBC 14.0*  --  15.0*  --   HGB 10.6*   < > 10.3* 10.9*  HCT 35.0*   < > 34.1* 32.0*  MCV 81.4  --  81.6  --   PLT 264  --  229  --    < > = values in this interval not displayed.   Basic Metabolic Panel Recent Labs    09/02/17 1556  09/03/17 0236 09/03/17 0420  NA 138   < > 139 139  K 5.2*   < > 4.8 4.6  CL 105   < > 103 103  CO2 21*  --  23  --   GLUCOSE 125*   < > 137* 135*  BUN 33*   < > 31* 27*  CREATININE 8.00*   < > 7.22* 6.50*  CALCIUM 9.0  --  9.9  --   MG 2.0  --  2.0  --   PHOS 6.2*  --  6.3*  --    < > = values in this interval not displayed.   Liver Function Tests Recent Labs    09/02/17 1556 09/03/17 0236  AST  --  32  ALT  --  11*  ALKPHOS  --  71  BILITOT  --  0.7  PROT  --  6.0*  ALBUMIN 2.7* 2.5*  2.5*   No results for input(s): LIPASE, AMYLASE in the last 72 hours. Cardiac Enzymes No results for input(s): CKTOTAL, CKMB, CKMBINDEX, TROPONINI in the last 72 hours.  BNP: BNP (last 3 results) Recent Labs    07/28/17 1831  BNP 1,098.0*    ProBNP (last 3 results) No results for input(s): PROBNP in the last 8760 hours.   D-Dimer No results for input(s): DDIMER in the last 72 hours. Hemoglobin A1C No results for input(s): HGBA1C in the last 72 hours. Fasting Lipid Panel No results for input(s): CHOL, HDL, LDLCALC, TRIG, CHOLHDL, LDLDIRECT in the last 72 hours. Thyroid Function Tests Recent Labs    09/03/17 0236  TSH 1.670    Other results:   Imaging    Dg Chest Port 1 View  Result Date: 09/02/2017 CLINICAL DATA:  Intubation, aortic valve replacement and CABG. EXAM: PORTABLE CHEST 1 VIEW COMPARISON:  09/02/2017. FINDINGS: Endotracheal tube terminates 5.6 cm above the carina. Nasogastric tube is followed into the stomach. Mediastinal drain and bilateral chest tubes in place. Inferior approach  Swan-Ganz catheter appears looped in the left pulmonary artery. Cardiomediastinal silhouette is stable. Lungs are low in volume with scattered atelectasis, basilar predominant. No definite pleural fluid. Difficult to exclude trace right pleural air. IMPRESSION: 1. Swan-Ganz catheter remains looped in the left pulmonary artery. Clinical service was made aware of this earlier today, per the comparison chest x-ray report 09/02/2017. 2. Difficult to exclude trace right pleural air with right chest tube in place. 3. No definite left pneumothorax. 4. Low lung volumes basilar predominant atelectasis. Electronically Signed   By: Lorin Picket M.D.   On: 09/02/2017 08:13      Medications:     Scheduled Medications: . acetaminophen  1,000 mg Oral Q6H   Or  . acetaminophen (TYLENOL) oral liquid 160 mg/5 mL  1,000 mg Per Tube Q6H  . amiodarone  150 mg Intravenous Once  . aspirin EC  325 mg Oral Daily   Or  . aspirin  324 mg Per Tube Daily  . atorvastatin  40 mg Per Tube q1800  . bisacodyl  10 mg Oral Daily   Or  . bisacodyl  10 mg Rectal Daily  . chlorhexidine gluconate (MEDLINE KIT)  15 mL Mouth Rinse BID  . Chlorhexidine Gluconate Cloth  6 each Topical Daily  . docusate sodium  200 mg Oral Daily  . insulin regular  0-10 Units Intravenous TID WC  . mouth rinse  15 mL Mouth Rinse 10 times per day  . midodrine  5 mg Per Tube TID WC  . pantoprazole  40 mg Oral Daily  . sodium chloride flush  10-40 mL Intracatheter Q12H  . sodium chloride flush  3 mL Intravenous Q12H     Infusions: . sodium chloride 10 mL/hr at 09/03/17 0600  . sodium chloride 1 mL/hr at 09/03/17 0600  . sodium chloride Stopped (09/02/17 0700)  . sodium chloride    . amiodarone 30 mg/hr (09/03/17 0600)  . calcium gluconate infusion for CRRT 80 mL/hr at 09/03/17 0600  . cefUROXime (ZINACEF)  IV Stopped (09/02/17 2217)  . dexmedetomidine (PRECEDEX) IV infusion 0.7 mcg/kg/hr (09/03/17 7619)  . insulin (NOVOLIN-R) infusion  1.9 Units/hr (09/03/17 0600)  . lactated ringers    . lactated ringers    . lactated ringers 10 mL/hr at 09/03/17 0600  . nitroGLYCERIN Stopped (09/01/17 1702)  . norepinephrine (  LEVOPHED) Adult infusion 20 mcg/min (09/03/17 0616)  . phenylephrine (NEO-SYNEPHRINE) Adult infusion Stopped (09/01/17 1900)  . dialysate (PRISMASATE) 1,500 mL/hr at 09/03/17 0413  . dialysis replacement fluid (prismasate) 300 mL/hr at 09/02/17 1040  . dialysis replacement fluid (prismasate) 350 mL/hr at 09/03/17 0413  . sodium citrate 2 %/dextrose 2.5% solution 3000 mL 270 mL/hr at 09/02/17 2253  . vasopressin (PITRESSIN) infusion - *FOR SHOCK* 0.04 Units/min (09/03/17 0600)     PRN Medications:  sodium chloride, sodium chloride, heparin, lactated ringers, midazolam, morphine injection, ondansetron (ZOFRAN) IV, oxyCODONE, sodium chloride flush, sodium chloride flush, traMADol   Assessment/Plan   1. 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 No active vegetation seen. Blood cultures werenegativeduring 4/19 hospitalization. Severe AI could certainly potentiate CHF. This was not see on 2012 echo. The LV is not significantly dilated, so not sure AI is the only explanation for his cardiomyopathy. He is now s/p mechanical AVR.  - Continue ASA, will need to start warfarin.   - Will need eventual post-op echo.  2. Acute on chronic systolic CHF/cardiogenic shock: Echo 4/19with EF 20-25%. As LV is not markedly dilated, I am not sure that aortic insufficiency can be invoked as the only cause for cardiomyopathy. He has hadchronic sinus tachycardia x years but HR has been in 100s-110s, do not think this would have been likely to cause a tachy-mediated CMP. He has serial 80% stenoses in the LCx, but do not think this can explain all of the cardiomyopathy either. It is possible  thatcardiomyopathyis multifactorial. Post-op, he has been hypotensive.  However, at baseline his BP is marginal and he requires midodrine with HD. He has reasonable cardiac output by co-ox and thermodilution from Swan, but remains on norepinephrine and vasopressin. No fever, WBCs not significantly elevated, CXR ok => do not think septic shock.  Weight is down this morning but remains above baseline.  CVP somewhat higher today at 13.  - Increase midodrine to 10 mg tid.  - Wean pressors, can use slightly lower BP goal given low baseline (MAP 60, SBP 90).  Start with discontinuing vasopressin.   - Continue gentle fluid removal via CVVH, would aim for 50 cc/hr for now.   3. ESRD:TTS HD, now on CVVH. 4. DVV:OHYWVPX 5/18 evening.  Now on amiodarone gtt.   5. CAD: S/p SVG-OM.   - Added statin.  6. Atrial fibrillation: Currently in atrial fibrillation, rate-controlled with amiodarone gtt.  Will be adding warfarin.  7. Acute hypoxemic respiratory failure: Patient is intubated, mildly elevated CVP correcting via CVVH.  Hopefully extubate soon.  He is awake on vent.   CRITICAL CARE Performed by: Loralie Champagne  Total critical care time: 35 minutes  Critical care time was exclusive of separately billable procedures and treating other patients.  Critical care was necessary to treat or prevent imminent or life-threatening deterioration.  Critical care was time spent personally by me on the following activities: development of treatment plan with patient and/or surrogate as well as nursing, discussions with consultants, evaluation of patient's response to treatment, examination of patient, obtaining history from patient or surrogate, ordering and performing treatments and interventions, ordering and review of laboratory studies, ordering and review of radiographic studies, pulse oximetry and re-evaluation of patient's condition.  Length of Stay: 2  Loralie Champagne, MD  09/03/2017, 7:31 AM  Advanced  Heart Failure Team Pager [phone] (M-F; 7a - 4p)  Please  contact [facility] for night-coverage after hours (4p -7a ) and weekends on [email or web address]

## 2017-09-03 NOTE — Progress Notes (Signed)
Patient ID: [name] [name], male   DOB: 1966-01-02, 52 y.o.   MRN: [id number] [facility] DAILY ICU PROGRESS NOTE                   [address]            [place] [address]          [phone]   2 Days Post-Op Procedure(s) (LRB): AORTIC VALVE REPLACEMENT (AVR) (N/A) CORONARY ARTERY BYPASS GRAFTING (CABG) X 1, USING RIGHT GREATER SAPHENOUS VEIN HARVESTED ENDOSCOPICALLY (N/A) TRANSESOPHAGEAL ECHOCARDIOGRAM (TEE) (N/A) FLOROSCOPY GUIDED PLACEMENT OF RIGHT FEMORAL CENTRAL LINE TIMES TWO  AND PLACEMENT OF SWAN GANZ CATHETER (Right) PLACEMENT OF TRIALYSIS SHORT TERM DIALYSIS CATHETER  Total Length of Stay:  LOS: 2 days   Subjective: Awake alert awake alert follows commands lifts his head without difficulty  Objective: Vital signs in last 24 hours: Temp:  [94.3 F (34.6 C)-98.6 F (37 C)] 94.3 F (34.6 C) (05/19 0700) Pulse Rate:  [41-114] 97 (05/19 0841) Cardiac Rhythm: Atrial fibrillation (05/19 0400) Resp:  [0-28] 22 (05/19 0700) BP: (95-106)/(57-68) 106/68 (05/19 0841) SpO2:  [96 %-100 %] 98 % (05/19 0841) Arterial Line BP: (85-114)/(48-70) 108/69 (05/19 0700) FiO2 (%):  [40 %-50 %] 40 % (05/19 0841) Weight:  [334 lb (151.5 kg)] 334 lb (151.5 kg) (05/19 0500)  Filed Weights   09/01/17 0611 09/02/17 0400 09/03/17 0500  Weight: (!) 308 lb (139.7 kg) (!) 337 lb 8.4 oz (153.1 kg) (!) 334 lb (151.5 kg)    Weight change: -3 lb 8.4 oz (-1.6 kg)   Hemodynamic parameters for last 24 hours: PAP: (21-34)/(12-22) 26/18 CVP:  [3 mmHg-11 mmHg] 11 mmHg CO:  [5.5 L/min-8.8 L/min] 5.5 L/min CI:  [2.1 L/min/m2-3.2 L/min/m2] 2.1 L/min/m2  Intake/Output from previous day: 05/18 0701 - 05/19 0700 In: 3405.4 [I.V.:3205.4; IV Piggyback:200] Out: 2498 [Drains:20; Chest Tube:330]  Intake/Output this shift: Total I/O In: 183 [I.V.:163; NG/GT:20] Out: 103 [Other:93; Chest Tube:10]  Current Meds: Scheduled Meds: . acetaminophen  1,000 mg Oral Q6H   Or  . acetaminophen  (TYLENOL) oral liquid 160 mg/5 mL  1,000 mg Per Tube Q6H  . amiodarone  150 mg Intravenous Once  . aspirin EC  325 mg Oral Daily   Or  . aspirin  324 mg Per Tube Daily  . atorvastatin  40 mg Per Tube q1800  . bisacodyl  10 mg Oral Daily   Or  . bisacodyl  10 mg Rectal Daily  . chlorhexidine gluconate (MEDLINE KIT)  15 mL Mouth Rinse BID  . Chlorhexidine Gluconate Cloth  6 each Topical Daily  . docusate sodium  200 mg Oral Daily  . insulin regular  0-10 Units Intravenous TID WC  . mouth rinse  15 mL Mouth Rinse 10 times per day  . midodrine  10 mg Per Tube TID WC  . pantoprazole  40 mg Oral Daily  . sodium chloride flush  10-40 mL Intracatheter Q12H  . sodium chloride flush  3 mL Intravenous Q12H   Continuous Infusions: . sodium chloride 10 mL/hr at 09/03/17 0600  . sodium chloride 1 mL/hr at 09/03/17 0600  . sodium chloride Stopped (09/02/17 0700)  . sodium chloride    . amiodarone 30 mg/hr (09/03/17 0700)  . calcium gluconate infusion for CRRT 80 mL/hr at 09/03/17 0700  . cefUROXime (ZINACEF)  IV Stopped (09/02/17 2217)  . dexmedetomidine (PRECEDEX) IV infusion 0.6 mcg/kg/hr (09/03/17 0745)  . insulin (NOVOLIN-R) infusion 6.4 Units/hr (09/03/17 0806)  .  lactated ringers    . lactated ringers    . lactated ringers 10 mL/hr at 09/03/17 0700  . nitroGLYCERIN Stopped (09/01/17 1702)  . norepinephrine (LEVOPHED) Adult infusion 20 mcg/min (09/03/17 0700)  . phenylephrine (NEO-SYNEPHRINE) Adult infusion Stopped (09/01/17 1900)  . dialysate (PRISMASATE) 1,500 mL/hr at 09/03/17 0743  . dialysis replacement fluid (prismasate) 300 mL/hr at 09/02/17 1040  . dialysis replacement fluid (prismasate) 350 mL/hr at 09/03/17 0413  . sodium citrate 2 %/dextrose 2.5% solution 3000 mL 270 mL/hr at 09/02/17 2253  . vasopressin (PITRESSIN) infusion - *FOR SHOCK* Stopped (09/03/17 0745)   PRN Meds:.sodium chloride, sodium chloride, heparin, lactated ringers, midazolam, morphine injection,  ondansetron (ZOFRAN) IV, oxyCODONE, sodium chloride flush, sodium chloride flush, traMADol  General appearance: alert and cooperative Neurologic: intact Heart: irregularly irregular rhythm Lungs: diminished breath sounds bibasilar Abdomen: soft, non-tender; bowel sounds normal; no masses,  no organomegaly Extremities: extremities normal, atraumatic, no cyanosis or edema and Homans sign is negative, no sign of DVT Wound: Sternum stable Swan-Ganz and dialysis catheter in the groins   Lab Results: CBC: Recent Labs    09/02/17 1556  09/03/17 0236  09/03/17 0756 09/03/17 0801  WBC 14.0*  --  15.0*  --   --   --   HGB 10.6*   < > 10.3*   < > 10.5* 12.9*  HCT 35.0*   < > 34.1*   < > 31.0* 38.0*  PLT 264  --  229  --   --   --    < > = values in this interval not displayed.   BMET:  Recent Labs    09/02/17 1556  09/03/17 0236  09/03/17 0756 09/03/17 0801  NA 138   < > 139   < > 138 141  K 5.2*   < > 4.8   < > 4.3 4.2  CL 105   < > 103   < > 104 100*  CO2 21*  --  23  --   --   --   GLUCOSE 125*   < > 137*   < > 136* 187*  BUN 33*   < > 31*   < > 26* 19  CREATININE 8.00*   < > 7.22*   < > 6.30* 3.50*  CALCIUM 9.0  --  9.9  --   --   --    < > = values in this interval not displayed.    CMET: Lab Results  Component Value Date   WBC 15.0 (H) 09/03/2017   HGB 12.9 (L) 09/03/2017   HCT 38.0 (L) 09/03/2017   PLT 229 09/03/2017   GLUCOSE 187 (H) 09/03/2017   CHOL 169 05/16/2013   TRIG 97 05/16/2013   HDL 35 (L) 05/16/2013   LDLCALC 115 (H) 05/16/2013   ALT 11 (L) 09/03/2017   AST 32 09/03/2017   NA 141 09/03/2017   K 4.2 09/03/2017   CL 100 (L) 09/03/2017   CREATININE 3.50 (H) 09/03/2017   BUN 19 09/03/2017   CO2 23 09/03/2017   TSH 1.670 09/03/2017   INR 1.29 09/03/2017   HGBA1C 5.0 08/21/2017      PT/INR:  Recent Labs    09/03/17 0236  LABPROT 16.0*  INR 1.29   Radiology: No results found.  COX  68   Assessment/Plan: S/P Procedure(s)  (LRB): AORTIC VALVE REPLACEMENT (AVR) (N/A) CORONARY ARTERY BYPASS GRAFTING (CABG) X 1, USING RIGHT GREATER SAPHENOUS VEIN HARVESTED ENDOSCOPICALLY (N/A) TRANSESOPHAGEAL ECHOCARDIOGRAM (TEE) (N/A) FLOROSCOPY GUIDED  PLACEMENT OF RIGHT FEMORAL CENTRAL LINE TIMES TWO  AND PLACEMENT OF SWAN GANZ CATHETER (Right) PLACEMENT OF TRIALYSIS SHORT TERM DIALYSIS CATHETER Patient neurologically intact Oxygenation requirements, alertness, decreasing pressor requirements, adequate COX -will wean ventilator and attempt extubation today  Developed rapid atrial fibrillation and started on amiodarone drip Remains on Levophed Milrinone and vasopressin have been discontinued Still need to pull more fluid off with CVVH Start low-dose Coumadin and monitor carefully Discussed patient's progress with his wife     Grace Isaac 09/03/2017 8:56 AM

## 2017-09-04 ENCOUNTER — Encounter (HOSPITAL_COMMUNITY): Payer: Self-pay | Admitting: Thoracic Surgery (Cardiothoracic Vascular Surgery)

## 2017-09-04 ENCOUNTER — Inpatient Hospital Stay (HOSPITAL_COMMUNITY): Payer: Medicare Other

## 2017-09-04 LAB — POCT I-STAT, CHEM 8
BUN: 14 mg/dL (ref 6–20)
BUN: 14 mg/dL (ref 6–20)
BUN: 16 mg/dL (ref 6–20)
BUN: 16 mg/dL (ref 6–20)
BUN: 16 mg/dL (ref 6–20)
BUN: 17 mg/dL (ref 6–20)
BUN: 17 mg/dL (ref 6–20)
BUN: 18 mg/dL (ref 6–20)
BUN: 18 mg/dL (ref 6–20)
BUN: 18 mg/dL (ref 6–20)
BUN: 18 mg/dL (ref 6–20)
BUN: 18 mg/dL (ref 6–20)
BUN: 18 mg/dL (ref 6–20)
BUN: 19 mg/dL (ref 6–20)
BUN: 20 mg/dL (ref 6–20)
BUN: 20 mg/dL (ref 6–20)
BUN: 20 mg/dL (ref 6–20)
BUN: 21 mg/dL — AB (ref 6–20)
BUN: 21 mg/dL — AB (ref 6–20)
BUN: 21 mg/dL — ABNORMAL HIGH (ref 6–20)
BUN: 21 mg/dL — ABNORMAL HIGH (ref 6–20)
BUN: 22 mg/dL — ABNORMAL HIGH (ref 6–20)
BUN: 22 mg/dL — ABNORMAL HIGH (ref 6–20)
BUN: 23 mg/dL — AB (ref 6–20)
BUN: 23 mg/dL — ABNORMAL HIGH (ref 6–20)
BUN: 25 mg/dL — AB (ref 6–20)
BUN: 27 mg/dL — ABNORMAL HIGH (ref 6–20)
BUN: 29 mg/dL — AB (ref 6–20)
CALCIUM ION: 0.34 mmol/L — AB (ref 1.15–1.40)
CALCIUM ION: 0.34 mmol/L — AB (ref 1.15–1.40)
CALCIUM ION: 0.36 mmol/L — AB (ref 1.15–1.40)
CALCIUM ION: 0.41 mmol/L — AB (ref 1.15–1.40)
CALCIUM ION: 0.45 mmol/L — AB (ref 1.15–1.40)
CALCIUM ION: 0.53 mmol/L — AB (ref 1.15–1.40)
CALCIUM ION: 0.53 mmol/L — AB (ref 1.15–1.40)
CALCIUM ION: 0.97 mmol/L — AB (ref 1.15–1.40)
CALCIUM ION: 1.06 mmol/L — AB (ref 1.15–1.40)
CALCIUM ION: 1.12 mmol/L — AB (ref 1.15–1.40)
CALCIUM ION: 1.15 mmol/L (ref 1.15–1.40)
CALCIUM ION: 1.19 mmol/L (ref 1.15–1.40)
CALCIUM ION: 1.19 mmol/L (ref 1.15–1.40)
CALCIUM ION: 1.2 mmol/L (ref 1.15–1.40)
CALCIUM ION: 1.24 mmol/L (ref 1.15–1.40)
CHLORIDE: 92 mmol/L — AB (ref 101–111)
CHLORIDE: 93 mmol/L — AB (ref 101–111)
CHLORIDE: 94 mmol/L — AB (ref 101–111)
CHLORIDE: 94 mmol/L — AB (ref 101–111)
CHLORIDE: 96 mmol/L — AB (ref 101–111)
CHLORIDE: 97 mmol/L — AB (ref 101–111)
CHLORIDE: 98 mmol/L — AB (ref 101–111)
CHLORIDE: 98 mmol/L — AB (ref 101–111)
CREATININE: 3.1 mg/dL — AB (ref 0.61–1.24)
CREATININE: 3.7 mg/dL — AB (ref 0.61–1.24)
CREATININE: 4.1 mg/dL — AB (ref 0.61–1.24)
CREATININE: 4.1 mg/dL — AB (ref 0.61–1.24)
CREATININE: 4.4 mg/dL — AB (ref 0.61–1.24)
CREATININE: 4.4 mg/dL — AB (ref 0.61–1.24)
CREATININE: 4.5 mg/dL — AB (ref 0.61–1.24)
CREATININE: 4.8 mg/dL — AB (ref 0.61–1.24)
CREATININE: 5.1 mg/dL — AB (ref 0.61–1.24)
CREATININE: 5.6 mg/dL — AB (ref 0.61–1.24)
Calcium, Ion: 0.45 mmol/L — CL (ref 1.15–1.40)
Calcium, Ion: 0.47 mmol/L — CL (ref 1.15–1.40)
Calcium, Ion: 0.47 mmol/L — CL (ref 1.15–1.40)
Calcium, Ion: 0.56 mmol/L — CL (ref 1.15–1.40)
Calcium, Ion: 1.13 mmol/L — ABNORMAL LOW (ref 1.15–1.40)
Calcium, Ion: 1.26 mmol/L (ref 1.15–1.40)
Calcium, Ion: 1.33 mmol/L (ref 1.15–1.40)
Calcium, Ion: 0.42 mmol/L — CL (ref 1.15–1.40)
Calcium, Ion: 0.35 mmol/L — CL (ref 1.15–1.40)
Calcium, Ion: 0.34 mmol/L — CL (ref 1.15–1.40)
Calcium, Ion: 0.94 mmol/L — ABNORMAL LOW (ref 1.15–1.40)
Calcium, Ion: 0.96 mmol/L — ABNORMAL LOW (ref 1.15–1.40)
Calcium, Ion: 1.15 mmol/L (ref 1.15–1.40)
Chloride: 101 mmol/L (ref 101–111)
Chloride: 93 mmol/L — ABNORMAL LOW (ref 101–111)
Chloride: 94 mmol/L — ABNORMAL LOW (ref 101–111)
Chloride: 95 mmol/L — ABNORMAL LOW (ref 101–111)
Chloride: 95 mmol/L — ABNORMAL LOW (ref 101–111)
Chloride: 95 mmol/L — ABNORMAL LOW (ref 101–111)
Chloride: 96 mmol/L — ABNORMAL LOW (ref 101–111)
Chloride: 96 mmol/L — ABNORMAL LOW (ref 101–111)
Chloride: 97 mmol/L — ABNORMAL LOW (ref 101–111)
Chloride: 93 mmol/L — ABNORMAL LOW (ref 101–111)
Chloride: 94 mmol/L — ABNORMAL LOW (ref 101–111)
Chloride: 95 mmol/L — ABNORMAL LOW (ref 101–111)
Chloride: 96 mmol/L — ABNORMAL LOW (ref 101–111)
Chloride: 92 mmol/L — ABNORMAL LOW (ref 101–111)
Chloride: 93 mmol/L — ABNORMAL LOW (ref 101–111)
Chloride: 94 mmol/L — ABNORMAL LOW (ref 101–111)
Chloride: 91 mmol/L — ABNORMAL LOW (ref 101–111)
Chloride: 93 mmol/L — ABNORMAL LOW (ref 101–111)
Chloride: 91 mmol/L — ABNORMAL LOW (ref 101–111)
Chloride: 91 mmol/L — ABNORMAL LOW (ref 101–111)
Creatinine, Ser: 4.1 mg/dL — ABNORMAL HIGH (ref 0.61–1.24)
Creatinine, Ser: 4.1 mg/dL — ABNORMAL HIGH (ref 0.61–1.24)
Creatinine, Ser: 4.6 mg/dL — ABNORMAL HIGH (ref 0.61–1.24)
Creatinine, Ser: 5 mg/dL — ABNORMAL HIGH (ref 0.61–1.24)
Creatinine, Ser: 5.3 mg/dL — ABNORMAL HIGH (ref 0.61–1.24)
Creatinine, Ser: 5.4 mg/dL — ABNORMAL HIGH (ref 0.61–1.24)
Creatinine, Ser: 5.4 mg/dL — ABNORMAL HIGH (ref 0.61–1.24)
Creatinine, Ser: 5.5 mg/dL — ABNORMAL HIGH (ref 0.61–1.24)
Creatinine, Ser: 5.7 mg/dL — ABNORMAL HIGH (ref 0.61–1.24)
Creatinine, Ser: 3.8 mg/dL — ABNORMAL HIGH (ref 0.61–1.24)
Creatinine, Ser: 3.9 mg/dL — ABNORMAL HIGH (ref 0.61–1.24)
Creatinine, Ser: 5.2 mg/dL — ABNORMAL HIGH (ref 0.61–1.24)
Creatinine, Ser: 3.4 mg/dL — ABNORMAL HIGH (ref 0.61–1.24)
Creatinine, Ser: 3.7 mg/dL — ABNORMAL HIGH (ref 0.61–1.24)
Creatinine, Ser: 4.7 mg/dL — ABNORMAL HIGH (ref 0.61–1.24)
Creatinine, Ser: 4.8 mg/dL — ABNORMAL HIGH (ref 0.61–1.24)
Creatinine, Ser: 3.4 mg/dL — ABNORMAL HIGH (ref 0.61–1.24)
Creatinine, Ser: 4.4 mg/dL — ABNORMAL HIGH (ref 0.61–1.24)
GLUCOSE: 111 mg/dL — AB (ref 65–99)
GLUCOSE: 113 mg/dL — AB (ref 65–99)
GLUCOSE: 116 mg/dL — AB (ref 65–99)
GLUCOSE: 132 mg/dL — AB (ref 65–99)
GLUCOSE: 139 mg/dL — AB (ref 65–99)
GLUCOSE: 154 mg/dL — AB (ref 65–99)
GLUCOSE: 167 mg/dL — AB (ref 65–99)
GLUCOSE: 176 mg/dL — AB (ref 65–99)
GLUCOSE: 176 mg/dL — AB (ref 65–99)
GLUCOSE: 188 mg/dL — AB (ref 65–99)
GLUCOSE: 190 mg/dL — AB (ref 65–99)
GLUCOSE: 90 mg/dL (ref 65–99)
Glucose, Bld: 143 mg/dL — ABNORMAL HIGH (ref 65–99)
Glucose, Bld: 154 mg/dL — ABNORMAL HIGH (ref 65–99)
Glucose, Bld: 157 mg/dL — ABNORMAL HIGH (ref 65–99)
Glucose, Bld: 158 mg/dL — ABNORMAL HIGH (ref 65–99)
Glucose, Bld: 77 mg/dL (ref 65–99)
Glucose, Bld: 98 mg/dL (ref 65–99)
Glucose, Bld: 98 mg/dL (ref 65–99)
Glucose, Bld: 122 mg/dL — ABNORMAL HIGH (ref 65–99)
Glucose, Bld: 128 mg/dL — ABNORMAL HIGH (ref 65–99)
Glucose, Bld: 177 mg/dL — ABNORMAL HIGH (ref 65–99)
Glucose, Bld: 135 mg/dL — ABNORMAL HIGH (ref 65–99)
Glucose, Bld: 142 mg/dL — ABNORMAL HIGH (ref 65–99)
Glucose, Bld: 192 mg/dL — ABNORMAL HIGH (ref 65–99)
Glucose, Bld: 135 mg/dL — ABNORMAL HIGH (ref 65–99)
Glucose, Bld: 197 mg/dL — ABNORMAL HIGH (ref 65–99)
Glucose, Bld: 193 mg/dL — ABNORMAL HIGH (ref 65–99)
HCT: 30 % — ABNORMAL LOW (ref 39.0–52.0)
HCT: 33 % — ABNORMAL LOW (ref 39.0–52.0)
HCT: 32 % — ABNORMAL LOW (ref 39.0–52.0)
HCT: 36 % — ABNORMAL LOW (ref 39.0–52.0)
HCT: 31 % — ABNORMAL LOW (ref 39.0–52.0)
HCT: 36 % — ABNORMAL LOW (ref 39.0–52.0)
HCT: 33 % — ABNORMAL LOW (ref 39.0–52.0)
HCT: 29 % — ABNORMAL LOW (ref 39.0–52.0)
HCT: 33 % — ABNORMAL LOW (ref 39.0–52.0)
HCT: 34 % — ABNORMAL LOW (ref 39.0–52.0)
HCT: 30 % — ABNORMAL LOW (ref 39.0–52.0)
HCT: 28 % — ABNORMAL LOW (ref 39.0–52.0)
HEMATOCRIT: 28 % — AB (ref 39.0–52.0)
HEMATOCRIT: 29 % — AB (ref 39.0–52.0)
HEMATOCRIT: 29 % — AB (ref 39.0–52.0)
HEMATOCRIT: 30 % — AB (ref 39.0–52.0)
HEMATOCRIT: 30 % — AB (ref 39.0–52.0)
HEMATOCRIT: 30 % — AB (ref 39.0–52.0)
HEMATOCRIT: 31 % — AB (ref 39.0–52.0)
HEMATOCRIT: 31 % — AB (ref 39.0–52.0)
HEMATOCRIT: 32 % — AB (ref 39.0–52.0)
HEMATOCRIT: 32 % — AB (ref 39.0–52.0)
HEMATOCRIT: 32 % — AB (ref 39.0–52.0)
HEMATOCRIT: 33 % — AB (ref 39.0–52.0)
HEMATOCRIT: 33 % — AB (ref 39.0–52.0)
HEMATOCRIT: 34 % — AB (ref 39.0–52.0)
HEMATOCRIT: 34 % — AB (ref 39.0–52.0)
HEMATOCRIT: 35 % — AB (ref 39.0–52.0)
HEMOGLOBIN: 10.2 g/dL — AB (ref 13.0–17.0)
HEMOGLOBIN: 11.2 g/dL — AB (ref 13.0–17.0)
HEMOGLOBIN: 12.2 g/dL — AB (ref 13.0–17.0)
HEMOGLOBIN: 10.5 g/dL — AB (ref 13.0–17.0)
HEMOGLOBIN: 10.9 g/dL — AB (ref 13.0–17.0)
HEMOGLOBIN: 9.9 g/dL — AB (ref 13.0–17.0)
HEMOGLOBIN: 10.2 g/dL — AB (ref 13.0–17.0)
HEMOGLOBIN: 9.9 g/dL — AB (ref 13.0–17.0)
HEMOGLOBIN: 10.2 g/dL — AB (ref 13.0–17.0)
HEMOGLOBIN: 11.6 g/dL — AB (ref 13.0–17.0)
HEMOGLOBIN: 10.5 g/dL — AB (ref 13.0–17.0)
HEMOGLOBIN: 11.6 g/dL — AB (ref 13.0–17.0)
HEMOGLOBIN: 11.2 g/dL — AB (ref 13.0–17.0)
HEMOGLOBIN: 9.9 g/dL — AB (ref 13.0–17.0)
HEMOGLOBIN: 10.5 g/dL — AB (ref 13.0–17.0)
HEMOGLOBIN: 9.5 g/dL — AB (ref 13.0–17.0)
HEMOGLOBIN: 9.5 g/dL — AB (ref 13.0–17.0)
Hemoglobin: 10.2 g/dL — ABNORMAL LOW (ref 13.0–17.0)
Hemoglobin: 10.9 g/dL — ABNORMAL LOW (ref 13.0–17.0)
Hemoglobin: 10.9 g/dL — ABNORMAL LOW (ref 13.0–17.0)
Hemoglobin: 11.2 g/dL — ABNORMAL LOW (ref 13.0–17.0)
Hemoglobin: 11.6 g/dL — ABNORMAL LOW (ref 13.0–17.0)
Hemoglobin: 11.9 g/dL — ABNORMAL LOW (ref 13.0–17.0)
Hemoglobin: 12.2 g/dL — ABNORMAL LOW (ref 13.0–17.0)
Hemoglobin: 11.2 g/dL — ABNORMAL LOW (ref 13.0–17.0)
Hemoglobin: 10.2 g/dL — ABNORMAL LOW (ref 13.0–17.0)
Hemoglobin: 10.9 g/dL — ABNORMAL LOW (ref 13.0–17.0)
Hemoglobin: 11.2 g/dL — ABNORMAL LOW (ref 13.0–17.0)
POTASSIUM: 3.9 mmol/L (ref 3.5–5.1)
POTASSIUM: 4 mmol/L (ref 3.5–5.1)
POTASSIUM: 4 mmol/L (ref 3.5–5.1)
POTASSIUM: 4 mmol/L (ref 3.5–5.1)
POTASSIUM: 4 mmol/L (ref 3.5–5.1)
POTASSIUM: 4.1 mmol/L (ref 3.5–5.1)
POTASSIUM: 4.1 mmol/L (ref 3.5–5.1)
POTASSIUM: 4.1 mmol/L (ref 3.5–5.1)
POTASSIUM: 4.2 mmol/L (ref 3.5–5.1)
POTASSIUM: 4.2 mmol/L (ref 3.5–5.1)
POTASSIUM: 4.4 mmol/L (ref 3.5–5.1)
Potassium: 4.2 mmol/L (ref 3.5–5.1)
Potassium: 4 mmol/L (ref 3.5–5.1)
Potassium: 4.7 mmol/L (ref 3.5–5.1)
Potassium: 3.9 mmol/L (ref 3.5–5.1)
Potassium: 4.2 mmol/L (ref 3.5–5.1)
Potassium: 3.9 mmol/L (ref 3.5–5.1)
Potassium: 4.1 mmol/L (ref 3.5–5.1)
Potassium: 4 mmol/L (ref 3.5–5.1)
Potassium: 4.2 mmol/L (ref 3.5–5.1)
Potassium: 4.2 mmol/L (ref 3.5–5.1)
Potassium: 3.9 mmol/L (ref 3.5–5.1)
Potassium: 4.1 mmol/L (ref 3.5–5.1)
Potassium: 4 mmol/L (ref 3.5–5.1)
Potassium: 3.9 mmol/L (ref 3.5–5.1)
Potassium: 4.1 mmol/L (ref 3.5–5.1)
Potassium: 3.9 mmol/L (ref 3.5–5.1)
Potassium: 4.1 mmol/L (ref 3.5–5.1)
SODIUM: 137 mmol/L (ref 135–145)
SODIUM: 137 mmol/L (ref 135–145)
SODIUM: 137 mmol/L (ref 135–145)
SODIUM: 137 mmol/L (ref 135–145)
SODIUM: 137 mmol/L (ref 135–145)
SODIUM: 137 mmol/L (ref 135–145)
SODIUM: 137 mmol/L (ref 135–145)
SODIUM: 138 mmol/L (ref 135–145)
SODIUM: 139 mmol/L (ref 135–145)
SODIUM: 139 mmol/L (ref 135–145)
SODIUM: 139 mmol/L (ref 135–145)
SODIUM: 140 mmol/L (ref 135–145)
SODIUM: 140 mmol/L (ref 135–145)
Sodium: 138 mmol/L (ref 135–145)
Sodium: 140 mmol/L (ref 135–145)
Sodium: 137 mmol/L (ref 135–145)
Sodium: 139 mmol/L (ref 135–145)
Sodium: 140 mmol/L (ref 135–145)
Sodium: 136 mmol/L (ref 135–145)
Sodium: 138 mmol/L (ref 135–145)
Sodium: 137 mmol/L (ref 135–145)
Sodium: 139 mmol/L (ref 135–145)
Sodium: 138 mmol/L (ref 135–145)
Sodium: 139 mmol/L (ref 135–145)
Sodium: 139 mmol/L (ref 135–145)
Sodium: 139 mmol/L (ref 135–145)
Sodium: 136 mmol/L (ref 135–145)
Sodium: 139 mmol/L (ref 135–145)
TCO2: 23 mmol/L (ref 22–32)
TCO2: 25 mmol/L (ref 22–32)
TCO2: 25 mmol/L (ref 22–32)
TCO2: 25 mmol/L (ref 22–32)
TCO2: 25 mmol/L (ref 22–32)
TCO2: 25 mmol/L (ref 22–32)
TCO2: 26 mmol/L (ref 22–32)
TCO2: 26 mmol/L (ref 22–32)
TCO2: 26 mmol/L (ref 22–32)
TCO2: 26 mmol/L (ref 22–32)
TCO2: 26 mmol/L (ref 22–32)
TCO2: 26 mmol/L (ref 22–32)
TCO2: 27 mmol/L (ref 22–32)
TCO2: 27 mmol/L (ref 22–32)
TCO2: 27 mmol/L (ref 22–32)
TCO2: 27 mmol/L (ref 22–32)
TCO2: 27 mmol/L (ref 22–32)
TCO2: 27 mmol/L (ref 22–32)
TCO2: 27 mmol/L (ref 22–32)
TCO2: 28 mmol/L (ref 22–32)
TCO2: 28 mmol/L (ref 22–32)
TCO2: 28 mmol/L (ref 22–32)
TCO2: 28 mmol/L (ref 22–32)
TCO2: 29 mmol/L (ref 22–32)
TCO2: 30 mmol/L (ref 22–32)
TCO2: 30 mmol/L (ref 22–32)
TCO2: 32 mmol/L (ref 22–32)
TCO2: 33 mmol/L — AB (ref 22–32)

## 2017-09-04 LAB — CBC
HEMATOCRIT: 31.8 % — AB (ref 39.0–52.0)
HEMATOCRIT: 32 % — AB (ref 39.0–52.0)
HEMOGLOBIN: 9.7 g/dL — AB (ref 13.0–17.0)
Hemoglobin: 9.7 g/dL — ABNORMAL LOW (ref 13.0–17.0)
MCH: 24.9 pg — ABNORMAL LOW (ref 26.0–34.0)
MCH: 24.9 pg — AB (ref 26.0–34.0)
MCHC: 30.5 g/dL (ref 30.0–36.0)
MCHC: 30.3 g/dL (ref 30.0–36.0)
MCV: 81.5 fL (ref 78.0–100.0)
MCV: 82.3 fL (ref 78.0–100.0)
PLATELETS: 214 10*3/uL (ref 150–400)
Platelets: 207 10*3/uL (ref 150–400)
RBC: 3.9 MIL/uL — ABNORMAL LOW (ref 4.22–5.81)
RBC: 3.89 MIL/uL — ABNORMAL LOW (ref 4.22–5.81)
RDW: 15.6 % — ABNORMAL HIGH (ref 11.5–15.5)
RDW: 15.8 % — ABNORMAL HIGH (ref 11.5–15.5)
WBC: 13.7 10*3/uL — AB (ref 4.0–10.5)
WBC: 14 10*3/uL — ABNORMAL HIGH (ref 4.0–10.5)

## 2017-09-04 LAB — RENAL FUNCTION PANEL
ALBUMIN: 2.2 g/dL — AB (ref 3.5–5.0)
Albumin: 2.4 g/dL — ABNORMAL LOW (ref 3.5–5.0)
Anion gap: 17 — ABNORMAL HIGH (ref 5–15)
Anion gap: 14 (ref 5–15)
BUN: 21 mg/dL — AB (ref 6–20)
BUN: 17 mg/dL (ref 6–20)
CALCIUM: 9.5 mg/dL (ref 8.9–10.3)
CALCIUM: 10.3 mg/dL (ref 8.9–10.3)
CHLORIDE: 93 mmol/L — AB (ref 101–111)
CO2: 28 mmol/L (ref 22–32)
CO2: 31 mmol/L (ref 22–32)
CREATININE: 5.23 mg/dL — AB (ref 0.61–1.24)
Chloride: 95 mmol/L — ABNORMAL LOW (ref 101–111)
Creatinine, Ser: 4.69 mg/dL — ABNORMAL HIGH (ref 0.61–1.24)
GFR calc Af Amer: 13 mL/min — ABNORMAL LOW (ref >60)
GFR calc Af Amer: 15 mL/min — ABNORMAL LOW (ref >60)
GFR calc non Af Amer: 11 mL/min — ABNORMAL LOW (ref >60)
GFR, EST NON AFRICAN AMERICAN: 13 mL/min — AB (ref >60)
Glucose, Bld: 138 mg/dL — ABNORMAL HIGH (ref 65–99)
Glucose, Bld: 156 mg/dL — ABNORMAL HIGH (ref 65–99)
PHOSPHORUS: 4.3 mg/dL (ref 2.5–4.6)
Phosphorus: 4.6 mg/dL (ref 2.5–4.6)
Potassium: 4.3 mmol/L (ref 3.5–5.1)
Potassium: 4.3 mmol/L (ref 3.5–5.1)
SODIUM: 140 mmol/L (ref 135–145)
Sodium: 138 mmol/L (ref 135–145)

## 2017-09-04 LAB — GLUCOSE, CAPILLARY
GLUCOSE-CAPILLARY: 102 mg/dL — AB (ref 65–99)
GLUCOSE-CAPILLARY: 111 mg/dL — AB (ref 65–99)
GLUCOSE-CAPILLARY: 120 mg/dL — AB (ref 65–99)
GLUCOSE-CAPILLARY: 126 mg/dL — AB (ref 65–99)
GLUCOSE-CAPILLARY: 131 mg/dL — AB (ref 65–99)
GLUCOSE-CAPILLARY: 136 mg/dL — AB (ref 65–99)
GLUCOSE-CAPILLARY: 139 mg/dL — AB (ref 65–99)
GLUCOSE-CAPILLARY: 76 mg/dL (ref 65–99)
GLUCOSE-CAPILLARY: 95 mg/dL (ref 65–99)
Glucose-Capillary: 109 mg/dL — ABNORMAL HIGH (ref 65–99)
Glucose-Capillary: 115 mg/dL — ABNORMAL HIGH (ref 65–99)
Glucose-Capillary: 146 mg/dL — ABNORMAL HIGH (ref 65–99)
Glucose-Capillary: 129 mg/dL — ABNORMAL HIGH (ref 65–99)
Glucose-Capillary: 142 mg/dL — ABNORMAL HIGH (ref 65–99)

## 2017-09-04 LAB — IRON AND TIBC
Iron: 15 ug/dL — ABNORMAL LOW (ref 45–182)
SATURATION RATIOS: 10 % — AB (ref 17.9–39.5)
TIBC: 151 ug/dL — ABNORMAL LOW (ref 250–450)
UIBC: 136 ug/dL

## 2017-09-04 LAB — COOXEMETRY PANEL
CARBOXYHEMOGLOBIN: 0.8 % (ref 0.5–1.5)
Carboxyhemoglobin: 0.9 % (ref 0.5–1.5)
Methemoglobin: 1.4 % (ref 0.0–1.5)
Methemoglobin: 1.1 % (ref 0.0–1.5)
O2 SAT: 43.2 %
O2 Saturation: 49.6 %
Total hemoglobin: 10.9 g/dL — ABNORMAL LOW (ref 12.0–16.0)
Total hemoglobin: 14.9 g/dL (ref 12.0–16.0)

## 2017-09-04 LAB — MAGNESIUM: MAGNESIUM: 1.9 mg/dL (ref 1.7–2.4)

## 2017-09-04 LAB — CALCIUM, IONIZED: Calcium, Ionized, Serum: 5.1 mg/dL (ref 4.5–5.6)

## 2017-09-04 LAB — PROTIME-INR
INR: 1.3
PROTHROMBIN TIME: 16.1 s — AB (ref 11.4–15.2)

## 2017-09-04 MED ORDER — NOREPINEPHRINE 16 MG/250ML-% IV SOLN
0.0000 ug/min | INTRAVENOUS | Status: DC
  Administered 2017-09-04: 17 ug/min via INTRAVENOUS
  Administered 2017-09-05: 13 ug/min via INTRAVENOUS
  Administered 2017-09-05: 2 ug/min via INTRAVENOUS
  Filled 2017-09-04 (×2): qty 250

## 2017-09-04 MED ORDER — ASPIRIN EC 81 MG PO TBEC
81.0000 mg | DELAYED_RELEASE_TABLET | Freq: Every day | ORAL | Status: DC
  Administered 2017-09-04 – 2017-09-10 (×7): 81 mg via ORAL
  Filled 2017-09-04 (×7): qty 1

## 2017-09-04 MED ORDER — ACETAMINOPHEN 500 MG PO TABS
1000.0000 mg | ORAL_TABLET | Freq: Four times a day (QID) | ORAL | Status: AC
  Administered 2017-09-04 – 2017-09-06 (×10): 1000 mg via ORAL
  Filled 2017-09-04 (×9): qty 2

## 2017-09-04 MED ORDER — ATORVASTATIN CALCIUM 40 MG PO TABS
40.0000 mg | ORAL_TABLET | Freq: Every day | ORAL | Status: DC
  Administered 2017-09-04 – 2017-09-05 (×2): 40 mg via ORAL
  Filled 2017-09-04 (×2): qty 1

## 2017-09-04 MED ORDER — ACETAMINOPHEN 160 MG/5ML PO SOLN
1000.0000 mg | Freq: Four times a day (QID) | ORAL | Status: AC
  Filled 2017-09-04: qty 40.6

## 2017-09-04 MED ORDER — WARFARIN SODIUM 2.5 MG PO TABS
2.5000 mg | ORAL_TABLET | Freq: Every day | ORAL | Status: DC
  Administered 2017-09-04 – 2017-09-05 (×2): 2.5 mg via ORAL
  Filled 2017-09-04 (×2): qty 1

## 2017-09-04 MED ORDER — MIDODRINE HCL 5 MG PO TABS
10.0000 mg | ORAL_TABLET | Freq: Three times a day (TID) | ORAL | Status: DC
  Administered 2017-09-04 – 2017-09-08 (×13): 10 mg via ORAL
  Filled 2017-09-04 (×13): qty 2

## 2017-09-04 MED ORDER — NOREPINEPHRINE 16 MG/250ML-% IV SOLN
0.0000 ug/min | INTRAVENOUS | Status: DC
  Administered 2017-09-04: 20 ug/min via INTRAVENOUS
  Filled 2017-09-04: qty 250

## 2017-09-04 MED ORDER — ASPIRIN EC 325 MG PO TBEC: 325.0000 mg | DELAYED_RELEASE_TABLET | Freq: Every day | ORAL | Status: DC

## 2017-09-04 MED ORDER — ASPIRIN 81 MG PO CHEW: 324.0000 mg | CHEWABLE_TABLET | Freq: Every day | ORAL | Status: DC

## 2017-09-04 NOTE — Progress Notes (Signed)
      [address]       [place] [address]             [phone]      POD # 3 AVR, CABG  Resting comfortably  BP (!) 87/54   Pulse (!) 101   Temp 98.7 F (37.1 C) (Oral)   Resp (!) 29   Ht 6' (1.829 m)   Wt (!) 331 lb 5.6 oz (150.3 kg)   SpO2 96%   BMI 44.94 kg/m   Intake/Output Summary (Last 24 hours) at 09/04/2017 1806 Last data filed at 09/04/2017 1700 Gross per 24 hour  Intake 3322.38 ml  Output 5437 ml  Net -2114.62 ml   K= 4.3, creatinine 4.69  Weaning norepi drip slowly  Remo Lipps C. Roxan Hockey, MD [facility] [phone]

## 2017-09-04 NOTE — Progress Notes (Addendum)
Patient ID: [name] [name], male   DOB: 03/11/1966, 52 y.o.   MRN: [id number]     Advanced Heart Failure Rounding Note  PCP-Cardiologist: Kate Sable, MD   Subjective:    Evening of 09/02/17 went into SVT at 160, started on amiodarone gtt.  He then went into atrial fibrillation in the 100s. This morning, he is in NSR.   Extubated 09/03/17 Coox 49.6% (but CI 3.1) on Norepi 24, Vaso 0.04. CVVH pulling 50-100 cc an hour.   Awake and alert this am. Sore. Coughing. Mildly SOB at times.  Swan numbers: CVP 10-11 PA 27/15 CI 3.1 Co-ox 49.6%   Objective:   Weight Range: (!) 331 lb 5.6 oz (150.3 kg) Body mass index is 44.94 kg/m.   Vital Signs:   Temp:  [90.3 F (32.4 C)-97.9 F (36.6 C)] 97.9 F (36.6 C) (05/20 0450) Pulse Rate:  [35-115] 102 (05/20 0450) Resp:  [0-38] 31 (05/20 0450) BP: (79-106)/(30-68) 79/30 (05/20 0450) SpO2:  [92 %-100 %] 97 % (05/20 0450) Arterial Line BP: (78-128)/(43-73) 78/73 (05/20 0450) FiO2 (%):  [40 %] 40 % (05/19 0954) Weight:  [331 lb 5.6 oz (150.3 kg)] 331 lb 5.6 oz (150.3 kg) (05/20 0400) Last BM Date: (prior to admission)  Weight change: Filed Weights   09/02/17 0400 09/03/17 0500 09/04/17 0400  Weight: (!) 337 lb 8.4 oz (153.1 kg) (!) 334 lb (151.5 kg) (!) 331 lb 5.6 oz (150.3 kg)    Intake/Output:   Intake/Output Summary (Last 24 hours) at 09/04/2017 0731 Last data filed at 09/04/2017 0700 Gross per 24 hour  Intake 3447.53 ml  Output 5312 ml  Net -1864.47 ml      Physical Exam    General: Awake and alert.   HEENT: Normal Neck: Supple. JVP to jaw. Carotids 2+ bilat; no bruits. No thyromegaly or nodule noted. Cor: PMI nondisplaced. Irregularly irregular. Mechanical S2. No rubs or gallops. 1/6 SEM RUSB Lungs: Diminished basilar sounds Abdomen: Soft, non-tender, non-distended, no HSM. No bruits or masses. +BS  Extremities: No cyanosis, clubbing, or rash. Trace to 1+ ankle edema.  Neuro: Alert & oriented x 3. Cranial nerves  grossly intact. Moves all 4 extremities w/o difficulty. Affect pleasant    Telemetry   NSR 90s-100s with PVCs, personally reviewed.   Labs    CBC Recent Labs    09/03/17 0236  09/04/17 0421 09/04/17 0535  WBC 15.0*  --   --  13.7*  HGB 10.3*   < > 11.6* 9.7*  HCT 34.1*   < > 34.0* 31.8*  MCV 81.6  --   --  81.5  PLT 229  --   --  214   < > = values in this interval not displayed.   Basic Metabolic Panel Recent Labs    09/03/17 0236  09/03/17 1559  09/04/17 0421 09/04/17 0535  NA 139   < > 139   < > 139 140  K 4.8   < > 4.4   < > 4.0 4.3  CL 103   < > 102   < > 93* 95*  CO2 23  --  24  --   --  28  GLUCOSE 137*   < > 117*   < > 176* 138*  BUN 31*   < > 25*   < > 17 21*  CREATININE 7.22*   < > 6.11*   < > 3.80* 5.23*  CALCIUM 9.9  --  10.1  --   --  9.5  MG 2.0  --   --   --   --  1.9  PHOS 6.3*  --  5.7*  --   --  4.6   < > = values in this interval not displayed.   Liver Function Tests Recent Labs    09/03/17 0236 09/03/17 1559 09/04/17 0535  AST 32  --   --   ALT 11*  --   --   ALKPHOS 71  --   --   BILITOT 0.7  --   --   PROT 6.0*  --   --   ALBUMIN 2.5*  2.5* 2.4* 2.4*   No results for input(s): LIPASE, AMYLASE in the last 72 hours. Cardiac Enzymes No results for input(s): CKTOTAL, CKMB, CKMBINDEX, TROPONINI in the last 72 hours.  BNP: BNP (last 3 results) Recent Labs    07/28/17 1831  BNP 1,098.0*    ProBNP (last 3 results) No results for input(s): PROBNP in the last 8760 hours.   D-Dimer No results for input(s): DDIMER in the last 72 hours. Hemoglobin A1C No results for input(s): HGBA1C in the last 72 hours. Fasting Lipid Panel No results for input(s): CHOL, HDL, LDLCALC, TRIG, CHOLHDL, LDLDIRECT in the last 72 hours. Thyroid Function Tests Recent Labs    09/03/17 0236  TSH 1.670    Other results:   Imaging    Dg Chest Port 1 View  Result Date: 09/04/2017 CLINICAL DATA:  Chest tube in place EXAM: PORTABLE CHEST 1 VIEW  COMPARISON:  Yesterday FINDINGS: Tracheal and esophageal extubation. Similar low lung volumes with perihilar atelectasis. Thoracic drains are in place. There is a Swan-Ganz catheter from below with looping at the main pulmonary artery/outflow tract. Cardiopericardial enlargement distorted by rotation. No visible pneumothorax. IMPRESSION: Extubation with stable low volumes and atelectasis. No visible pneumothorax. Electronically Signed   By: Monte Fantasia M.D.   On: 09/04/2017 07:18     Medications:     Scheduled Medications: . acetaminophen  1,000 mg Oral Q6H   Or  . acetaminophen (TYLENOL) oral liquid 160 mg/5 mL  1,000 mg Per Tube Q6H  . amiodarone  150 mg Intravenous Once  . aspirin EC  325 mg Oral Daily   Or  . aspirin  324 mg Per Tube Daily  . atorvastatin  40 mg Per Tube q1800  . bisacodyl  10 mg Oral Daily   Or  . bisacodyl  10 mg Rectal Daily  . Chlorhexidine Gluconate Cloth  6 each Topical Daily  . docusate sodium  200 mg Oral Daily  . insulin regular  0-10 Units Intravenous TID WC  . mouth rinse  15 mL Mouth Rinse BID  . midodrine  10 mg Per Tube TID WC  . pantoprazole  40 mg Oral Daily  . sodium chloride flush  10-40 mL Intracatheter Q12H  . sodium chloride flush  3 mL Intravenous Q12H  . Warfarin - Physician Dosing Inpatient   Does not apply q1800    Infusions: . sodium chloride 10 mL/hr at 09/04/17 0300  . sodium chloride 1 mL/hr at 09/04/17 0300  . sodium chloride Stopped (09/02/17 0700)  . sodium chloride    . amiodarone 30 mg/hr (09/04/17 0300)  . calcium gluconate infusion for CRRT 60 mL/hr at 09/04/17 0300  . dexmedetomidine (PRECEDEX) IV infusion 0.301 mcg/kg/hr (09/04/17 0300)  . insulin (NOVOLIN-R) infusion 1.7 Units/hr (09/04/17 0300)  . lactated ringers    . lactated ringers    . lactated ringers 10 mL/hr at  09/04/17 0300  . nitroGLYCERIN Stopped (09/01/17 1702)  . norepinephrine (LEVOPHED) Adult infusion 24 mcg/min (09/04/17 0457)  . phenylephrine  (NEO-SYNEPHRINE) Adult infusion Stopped (09/01/17 1900)  . dialysate (PRISMASATE) 1,500 mL/hr at 09/04/17 0629  . dialysis replacement fluid (prismasate) 380 mL/hr at 09/04/17 0218  . sodium citrate 2 %/dextrose 2.5% solution 3000 mL 250 mL/hr at 09/03/17 1957  . vasopressin (PITRESSIN) infusion - *FOR SHOCK* 0.04 Units/min (09/04/17 0300)    PRN Medications: sodium chloride, sodium chloride, heparin, lactated ringers, midazolam, morphine injection, ondansetron (ZOFRAN) IV, oxyCODONE, sodium chloride flush, sodium chloride flush, traMADol   Assessment/Plan   1. 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 No active vegetation seen.  - Blood cultures werenegativeduring 4/19 hospitalization. Severe AI could certainly potentiate CHF. This was not see on 2012 echo. The LV is not significantly dilated, so not sure AI is the only explanation for his cardiomyopathy.  - s/p mechanical AVR 09/01/17.  - Continue ASA, warfarin started.   - Will eventually need post op Echo.  2. Acute on chronic systolic CHF/cardiogenic shock: Echo 4/19with EF 20-25%. As LV is not markedly dilated, I am not sure that aortic insufficiency can be invoked as the only cause for cardiomyopathy. He has hadchronic sinus tachycardia x years but HR has been in 100s-110s, do not think this would have been likely to cause a tachy-mediated CMP. He has serial 80% stenoses in the LCx, but do not think this can explain all of the cardiomyopathy either. It is possible thatcardiomyopathyis multifactorial. Post-op, he has been hypotensive.  However, at baseline his BP is marginal and he requires midodrine with HD. He has reasonable cardiac output by co-ox and thermodilution from Swan, but remains on norepinephrine and vasopressin. No fever, WBCs not significantly elevated, CXR ok => do not think septic  shock.   - Weight down another 2 lbs. CVP 10 - Continue midodrine 10 mg tid. BP low/marginal at baseline (requires midodrine for HD) - Wean pressors as tolerated (MAP 60, SBP 85 goal).  - Continue gentle fluid removal via CVVH. Taking 50-100 cc/hr off.  3. Acute hypoxemic respiratory failure:  - Extubated 09/03/17.  4. ESRD: - TTS HD, now on CVVH. 5. SVT: - Episode 5/18 evening.  Now on amiodarone gtt with improvement.  6. CAD: S/p SVG-OM.   - Continue statin and ASA.  7. Atrial fibrillation:  - He is in NSR this am on amiodarone.  - Coumadin started 09/03/17. TCTS dosing.   Length of Stay: 3  Annamaria Helling  09/04/2017, 7:31 AM  Advanced Heart Failure Team Pager [phone] (M-F; 7a - 4p)  Please contact [facility] for night-coverage after hours (4p -7a ) and weekends on [email or web address]  Patient seen with PA, agree with the above note.  [name] [name] was extubated yesterday but remains on norepinephrine and vasopressin with hypotension.  Good cardiac index this morning at 3.1 though co-ox only 50%.  CVP 10, on CVVH.  He is awake/alert this morning, no complaints other than sore chest.   On exam, JVP 10 cm.  Decreased breath sounds at bases on lung exam.  Regular rhythm, mechanical S2.  No edema.   I think cardiac output is adequate based on thermodilution numbers from [place].  Will re-send co-ox this morning now that he is awake and oxygen saturations higher.  Would not restart milrinone.   CVP 10, continue gentle UF  via CVVH in the 50-100 cc/hr range to keep him net negative.  Would not be overly aggressive with low BP.   BP remains low, on norepinephrine and vasopressin.  No evidence for septic shock.  At baseline, he has low BP and requires midodrine with HD.  - Continue to titrate down pressors today, keeping SBP 85/MAP 60 (aim a bit lower for him given baseline).  - Continue midodrine 10 mg tid.   He is on amiodarone gtt for afib/RVR and SVT.  He is back in NSR this  morning.   ASA/warfarin for mechanical valve.   Loralie Champagne 09/04/2017 8:01 AM

## 2017-09-04 NOTE — Progress Notes (Signed)
RT note-Attempted two times for Aline, unable to thread catheter, RN aware.

## 2017-09-04 NOTE — Progress Notes (Signed)
[facility] Rounding Note  Background:  52 year old [name] HTN, CM w/low EF (20-25), CAD, ESRD [facility] (on HD for 17 years!!), secondary HPT, anemia. S/P single vessel CABG/AoVR 2/99/24 complicated by shock requiring pressors. CRRT initiated via L fem cath 09/02/17.   Usual HD orders [facility] 4 hours and 45 min TTS  Left AVF  450/800- 2K/2.5 calc  EDW 140.5 2000 per hour of heparin Venofer 50 qTues Hect 12 mcg q tx/parsabiv 2.5 q tx  Recommendations/Plans 1. ESRD - usual TTS [facility]. CRRT post CABG/valve replacement d/t hemodynamic instability. Citrate anticoagulation. Femoral vas cath placed 5/18. Currently pulling 50-100/hour per cards. EDW said to be 140 or so , so still about 10 kg over. Keeping pressors and midodrine added for BP. No major drip weans as of yet. Dr. Roxy Manns says maybe can change citrate to heparin in a couple days once tubes out, etc. K and phos are fine at this time 2. Vascular access - has L AVF. Apparently all occluded up top, all lines have had to be femoral 3. Shock - 2 pressors + midodrine added 4. S/p mechanical AoVR and single vessel (SVG to OM) CABG 5/17 .  On ASA and warfarin starting per cards. Still requiring pressor support w/NE and vasopressin. CVP now 10 5. SVT/AF - NSR on amio at present.  6. Anemia - Hb <10 on CBC, higher (as usual) on istat. No ESA as outpt. If serial CBC's show Hb consistently <10 will add Aranesp but hold for now. Does get weekly Fe as outpt. Check Fe studies here.  7. Secondary HPT - normally on parsabiv (not available here) and hectorol- once converted to IHD will add back.   Subjective:   CRRT started 5/18 Fem cath On citrate anticoagulation All 4K fluids 50-100 net neg CVP around 10 Remains on 2 pressors. Midodrine added.  Says he would feel "OK" if he could stop coughing  Objective Vital signs in last 24 hours: Vitals:   09/04/17 0450 09/04/17 0600 09/04/17 0700 09/04/17 0730  BP: (!) 79/30      Pulse: (!) 102 (!) 101 95 (!) 52  Resp: (!) 31 17 (!) 33 (!) 30  Temp: 97.9 F (36.6 C) 97.9 F (36.6 C) 97.9 F (36.6 C) 98.1 F (36.7 C)  TempSrc:      SpO2: 97% 96% 96% 96%  Weight:      Height:       Weight change: -1.2 kg (-2 lb 10.3 oz)  Intake/Output Summary (Last 24 hours) at 09/04/2017 2683 Last data filed at 09/04/2017 0800 Gross per 24 hour  Intake 3264.53 ml  Output 5452 ml  Net -2187.47 ml   Physical Exam: Very pleasant [name] Weight noted at 150.3, EDW is 140.5 (an  Says gets to that at outpt HD) Nasal cannula O2 S1S2 No S3 NSR on tele + JVD Ant lungs clear Chest tubes still in place Abd obese not tender SCD's in place, trace LE edema L FA AVF + bruit (but soft). Arm edematous.  L fem vas cath (5/17) running CRRT   Recent Labs  Lab 09/03/17 0236  09/03/17 1559  09/04/17 0535 09/04/17 0621 09/04/17 0758 09/04/17 0804  NA 139   < > 139   < > 140 139 138 139  K 4.8   < > 4.4   < > 4.3 4.0 4.1 3.9  CL 103   < > 102   < > 95* 93* 94* 92*  CO2 23  --  24  --  28  --   --   --   GLUCOSE 137*   < > 117*   < > 138* 190* 132* 188*  BUN 31*   < > 25*   < > 21* 16 18 16   CREATININE 7.22*   < > 6.11*   < > 5.23* 3.40* 4.40* 3.70*  CALCIUM 9.9  --  10.1  --  9.5  --   --   --   PHOS 6.3*  --  5.7*  --  4.6  --   --   --    < > = values in this interval not displayed.    Recent Labs  Lab 09/03/17 0236 09/03/17 1559 09/04/17 0535  AST 32  --   --   ALT 11*  --   --   ALKPHOS 71  --   --   BILITOT 0.7  --   --   PROT 6.0*  --   --   ALBUMIN 2.5*  2.5* 2.4* 2.4*    Recent Labs  Lab 09/01/17 2300 09/02/17 0345 09/02/17 1556  09/03/17 0236  09/04/17 0421 09/04/17 0535 09/04/17 0621  WBC 11.3* 10.3 14.0*  --  15.0*  --   --  13.7*  --   HGB 11.0* 10.9* 10.6*   < > 10.3*   < > 11.6* 9.7* 11.6*  HCT 36.2* 36.0* 35.0*   < > 34.1*   < > 34.0* 31.8* 34.0*  MCV 82.5 81.6 81.4  --  81.6  --   --  81.5  --   PLT 273 271 264  --  229  --   --  214  --     < > = values in this interval not displayed.    Recent Labs  Lab 09/04/17 0415 09/04/17 0517 09/04/17 0616 09/04/17 0706 09/04/17 0757  GLUCAP 131* 146* 139* 136* 129*   Studies/Results: Dg Chest Port 1 View  Result Date: 09/04/2017 CLINICAL DATA:  Chest tube in place EXAM: PORTABLE CHEST 1 VIEW COMPARISON:  Yesterday FINDINGS: Tracheal and esophageal extubation. Similar low lung volumes with perihilar atelectasis. Thoracic drains are in place. There is a Swan-Ganz catheter from below with looping at the main pulmonary artery/outflow tract. Cardiopericardial enlargement distorted by rotation. No visible pneumothorax. IMPRESSION: Extubation with stable low volumes and atelectasis. No visible pneumothorax. Electronically Signed   By: Monte Fantasia M.D.   On: 09/04/2017 07:18   Dg Chest Port 1 View  Result Date: 09/03/2017 CLINICAL DATA:  Chest tube. EXAM: PORTABLE CHEST 1 VIEW COMPARISON:  09/02/2017 FINDINGS: Endotracheal tube, nasogastric tube, mediastinal drain and bilateral chest tubes unchanged. Inferior pro chin Swan-Ganz catheter with slight interval straightening although remains looped over the main pulmonary artery segment. Lungs are hypoinflated without evidence of effusion or pneumothorax. Suggestion of left basilar atelectasis. Stable cardiomegaly. Remainder of the exam is unchanged. IMPRESSION: Hypoinflation with suggestion of minimal left basilar atelectasis. Multiple tubes and lines as described without significant change. Electronically Signed   By: Marin Olp M.D.   On: 09/03/2017 10:00   Medications: Infusions: . sodium chloride 10 mL/hr at 09/04/17 0300  . sodium chloride 1 mL/hr at 09/04/17 0300  . sodium chloride Stopped (09/02/17 0700)  . sodium chloride    . amiodarone 30 mg/hr (09/04/17 0300)  . calcium gluconate infusion for CRRT 60 mL/hr at 09/04/17 0300  . norepinephrine (LEVOPHED) Adult infusion 24 mcg/min (09/04/17 0457)  . phenylephrine  (NEO-SYNEPHRINE) Adult infusion Stopped (09/01/17  1900)  . dialysate (PRISMASATE) 1,500 mL/hr at 09/04/17 0629  . dialysis replacement fluid (prismasate) 380 mL/hr at 09/04/17 0218  . sodium citrate 2 %/dextrose 2.5% solution 3000 mL 250 mL/hr at 09/03/17 1957  . vasopressin (PITRESSIN) infusion - *FOR SHOCK* 0.04 Units/min (09/04/17 0300)   Scheduled Medications: . acetaminophen (TYLENOL) oral liquid 160 mg/5 mL  1,000 mg Oral Q6H   Or  . acetaminophen  1,000 mg Oral Q6H  . amiodarone  150 mg Intravenous Once  . aspirin EC  81 mg Oral Daily  . atorvastatin  40 mg Oral q1800  . bisacodyl  10 mg Oral Daily   Or  . bisacodyl  10 mg Rectal Daily  . Chlorhexidine Gluconate Cloth  6 each Topical Daily  . docusate sodium  200 mg Oral Daily  . mouth rinse  15 mL Mouth Rinse BID  . midodrine  10 mg Oral TID WC  . pantoprazole  40 mg Oral Daily  . sodium chloride flush  10-40 mL Intracatheter Q12H  . sodium chloride flush  3 mL Intravenous Q12H  . warfarin  2.5 mg Oral q1800  . Warfarin - Physician Dosing Inpatient   Does not apply q1800   Jamal Maes, MD [facility] [phone] Pager 09/04/2017, 8:43 AM

## 2017-09-04 NOTE — Progress Notes (Signed)
[address]       [place] [address]             [phone]        CARDIOTHORACIC SURGERY PROGRESS NOTE   R3 Days Post-Op Procedure(s) (LRB): AORTIC VALVE REPLACEMENT (AVR) (N/A) CORONARY ARTERY BYPASS GRAFTING (CABG) X 1, USING RIGHT GREATER SAPHENOUS VEIN HARVESTED ENDOSCOPICALLY (N/A) TRANSESOPHAGEAL ECHOCARDIOGRAM (TEE) (N/A) FLOROSCOPY GUIDED PLACEMENT OF RIGHT FEMORAL CENTRAL LINE TIMES TWO  AND PLACEMENT OF SWAN GANZ CATHETER (Right) PLACEMENT OF TRIALYSIS SHORT TERM DIALYSIS CATHETER  Subjective: Looks good and feels well.  Mild soreness in chest.  No SOB.  Marginal appetite  Objective: Vital signs: BP Readings from Last 1 Encounters:  09/04/17 (!) 79/30   Pulse Readings from Last 1 Encounters:  09/04/17 (!) 52   Resp Readings from Last 1 Encounters:  09/04/17 (!) 30   Temp Readings from Last 1 Encounters:  09/04/17 98.1 F (36.7 C)    Hemodynamics: PAP: (24-43)/(10-27) 36/13 CVP:  [3 mmHg-17 mmHg] 8 mmHg PCWP:  [23 mmHg] 23 mmHg CO:  [6.1 L/min] 6.1 L/min CI:  [2.4 L/min/m2] 2.4 L/min/m2  Physical Exam:  Rhythm:   Sinus w/ PAC's and PVC's  Breath sounds: clear  Heart sounds:  RRR w/ mechanical sounds  Incisions:  Dressings dry, intact  Abdomen:  Soft, non-distended, non-tender  Extremities:  Warm, well-perfused   Intake/Output from previous day: 05/19 0701 - 05/20 0700 In: 3447.5 [I.V.:3327.5; NG/GT:20; IV Piggyback:100] Out: 6222 [Drains:7; Chest Tube:120] Intake/Output this shift: Total I/O In: -  Out: 243 [Other:243]  Lab Results:  CBC: Recent Labs    09/03/17 0236  09/04/17 0535 09/04/17 0621  WBC 15.0*  --  13.7*  --   HGB 10.3*   < > 9.7* 11.6*  HCT 34.1*   < > 31.8* 34.0*  PLT 229  --  214  --    < > = values in this interval not displayed.    BMET:  Recent Labs    09/03/17 1559  09/04/17 0535 09/04/17 0621  NA 139   < > 140 139  K 4.4   < > 4.3 4.0  CL 102   < > 95* 93*  CO2 24  --  28  --     GLUCOSE 117*   < > 138* 190*  BUN 25*   < > 21* 16  CREATININE 6.11*   < > 5.23* 3.40*  CALCIUM 10.1  --  9.5  --    < > = values in this interval not displayed.     PT/INR:   Recent Labs    09/04/17 0535  LABPROT 16.1*  INR 1.30    CBG (last 3)  Recent Labs    09/04/17 0616 09/04/17 0706 09/04/17 0757  GLUCAP 139* 136* 129*    ABG    Component Value Date/Time   PHART 7.409 09/03/2017 1110   PCO2ART 36.4 09/03/2017 1110   PO2ART 67.0 (L) 09/03/2017 1110   HCO3 23.5 09/03/2017 1110   TCO2 26 09/04/2017 0621   ACIDBASEDEF 1.0 09/03/2017 1110   O2SAT 49.6 09/04/2017 0430    CXR: PORTABLE CHEST 1 VIEW  COMPARISON:  Yesterday  FINDINGS: Tracheal and esophageal extubation. Similar low lung volumes with perihilar atelectasis. Thoracic drains are in place. There is a Swan-Ganz catheter from below with looping at the main pulmonary artery/outflow tract. Cardiopericardial enlargement distorted by rotation. No visible pneumothorax.  IMPRESSION: Extubation with stable low volumes and  atelectasis. No visible pneumothorax.   Electronically Signed   By: Monte Fantasia M.D.   On: 09/04/2017 07:18   Assessment/Plan: S/P Procedure(s) (LRB): AORTIC VALVE REPLACEMENT (AVR) (N/A) CORONARY ARTERY BYPASS GRAFTING (CABG) X 1, USING RIGHT GREATER SAPHENOUS VEIN HARVESTED ENDOSCOPICALLY (N/A) TRANSESOPHAGEAL ECHOCARDIOGRAM (TEE) (N/A) FLOROSCOPY GUIDED PLACEMENT OF RIGHT FEMORAL CENTRAL LINE TIMES TWO  AND PLACEMENT OF SWAN GANZ CATHETER (Right) PLACEMENT OF TRIALYSIS SHORT TERM DIALYSIS CATHETER  Overall clinically stable and maintaining NSR w/out any further episodes of SVT on IV amiodarone Remains on levophed and vasopressin for BP support - patient w/ chronic hypotension preop - cardiac index excellent and PA pressures low, co-ox not likely accurate or reliable due to access sourse Breathing comfortably w/ O2 sats 97% and CXR clear ESRD on CVVHD - I/O's negative  1.8 liters yesterday but weight still 23 lbs > preop Acute on chronic combined systolic and diastolic CHF with expected post-op volume excess Expected post op acute blood loss anemia with preop anemia of chronic disease assoc w/ ESRD   D/C Swan-Ganz  D/C chest tubes  D/C JP drain  Continue midodrine and wean vasopressin and levophed as tolerated - need to expect SBP 85  Continue CVVHD for now - will need to get temporary dialysis catheter out reasonably soon so as to get patient up and out of bed  Continue amiodarone  Continue Coumadin   [name], MD 09/04/2017 8:13 AM

## 2017-09-05 ENCOUNTER — Inpatient Hospital Stay (HOSPITAL_COMMUNITY): Payer: Medicare Other

## 2017-09-05 LAB — POCT I-STAT, CHEM 8
BUN: 15 mg/dL (ref 6–20)
BUN: 18 mg/dL (ref 6–20)
BUN: 12 mg/dL (ref 6–20)
BUN: 16 mg/dL (ref 6–20)
BUN: 13 mg/dL (ref 6–20)
CALCIUM ION: 1.08 mmol/L — AB (ref 1.15–1.40)
CREATININE: 3.7 mg/dL — AB (ref 0.61–1.24)
Calcium, Ion: 0.38 mmol/L — CL (ref 1.15–1.40)
Calcium, Ion: 0.35 mmol/L — CL (ref 1.15–1.40)
Calcium, Ion: 1.06 mmol/L — ABNORMAL LOW (ref 1.15–1.40)
Calcium, Ion: <0.3 mmol/L — CL (ref 1.15–1.40)
Chloride: 88 mmol/L — ABNORMAL LOW (ref 101–111)
Chloride: 88 mmol/L — ABNORMAL LOW (ref 101–111)
Chloride: 88 mmol/L — ABNORMAL LOW (ref 101–111)
Chloride: 89 mmol/L — ABNORMAL LOW (ref 101–111)
Chloride: 86 mmol/L — ABNORMAL LOW (ref 101–111)
Creatinine, Ser: 3.2 mg/dL — ABNORMAL HIGH (ref 0.61–1.24)
Creatinine, Ser: 3 mg/dL — ABNORMAL HIGH (ref 0.61–1.24)
Creatinine, Ser: 3.9 mg/dL — ABNORMAL HIGH (ref 0.61–1.24)
Creatinine, Ser: 3 mg/dL — ABNORMAL HIGH (ref 0.61–1.24)
GLUCOSE: 136 mg/dL — AB (ref 65–99)
GLUCOSE: 215 mg/dL — AB (ref 65–99)
Glucose, Bld: 192 mg/dL — ABNORMAL HIGH (ref 65–99)
Glucose, Bld: 106 mg/dL — ABNORMAL HIGH (ref 65–99)
Glucose, Bld: 188 mg/dL — ABNORMAL HIGH (ref 65–99)
HCT: 28 % — ABNORMAL LOW (ref 39.0–52.0)
HCT: 31 % — ABNORMAL LOW (ref 39.0–52.0)
HCT: 28 % — ABNORMAL LOW (ref 39.0–52.0)
HCT: 31 % — ABNORMAL LOW (ref 39.0–52.0)
HCT: 32 % — ABNORMAL LOW (ref 39.0–52.0)
Hemoglobin: 10.5 g/dL — ABNORMAL LOW (ref 13.0–17.0)
Hemoglobin: 9.5 g/dL — ABNORMAL LOW (ref 13.0–17.0)
Hemoglobin: 10.5 g/dL — ABNORMAL LOW (ref 13.0–17.0)
Hemoglobin: 9.5 g/dL — ABNORMAL LOW (ref 13.0–17.0)
Hemoglobin: 10.9 g/dL — ABNORMAL LOW (ref 13.0–17.0)
POTASSIUM: 3.4 mmol/L — AB (ref 3.5–5.1)
Potassium: 3.8 mmol/L (ref 3.5–5.1)
Potassium: 4 mmol/L (ref 3.5–5.1)
Potassium: 3.6 mmol/L (ref 3.5–5.1)
Potassium: 3.7 mmol/L (ref 3.5–5.1)
SODIUM: 140 mmol/L (ref 135–145)
Sodium: 137 mmol/L (ref 135–145)
Sodium: 139 mmol/L (ref 135–145)
Sodium: 138 mmol/L (ref 135–145)
Sodium: 140 mmol/L (ref 135–145)
TCO2: 31 mmol/L (ref 22–32)
TCO2: 33 mmol/L — AB (ref 22–32)
TCO2: 32 mmol/L (ref 22–32)
TCO2: 35 mmol/L — ABNORMAL HIGH (ref 22–32)
TCO2: 32 mmol/L (ref 22–32)

## 2017-09-05 LAB — RENAL FUNCTION PANEL
ALBUMIN: 2.1 g/dL — AB (ref 3.5–5.0)
ANION GAP: 12 (ref 5–15)
BUN: 16 mg/dL (ref 6–20)
CALCIUM: 10.4 mg/dL — AB (ref 8.9–10.3)
CO2: 37 mmol/L — AB (ref 22–32)
CREATININE: 4.08 mg/dL — AB (ref 0.61–1.24)
Chloride: 93 mmol/L — ABNORMAL LOW (ref 101–111)
GFR calc non Af Amer: 15 mL/min — ABNORMAL LOW (ref >60)
GFR, EST AFRICAN AMERICAN: 18 mL/min — AB (ref >60)
Glucose, Bld: 121 mg/dL — ABNORMAL HIGH (ref 65–99)
Phosphorus: 4.1 mg/dL (ref 2.5–4.6)
Potassium: 3.8 mmol/L (ref 3.5–5.1)
SODIUM: 142 mmol/L (ref 135–145)

## 2017-09-05 LAB — COMPREHENSIVE METABOLIC PANEL
ALT: 10 U/L — ABNORMAL LOW (ref 17–63)
AST: 16 U/L (ref 15–41)
Albumin: 2.3 g/dL — ABNORMAL LOW (ref 3.5–5.0)
Alkaline Phosphatase: 84 U/L (ref 38–126)
Anion gap: 14 (ref 5–15)
BILIRUBIN TOTAL: 0.8 mg/dL (ref 0.3–1.2)
BUN: 16 mg/dL (ref 6–20)
CHLORIDE: 92 mmol/L — AB (ref 101–111)
CO2: 34 mmol/L — ABNORMAL HIGH (ref 22–32)
Calcium: 10 mg/dL (ref 8.9–10.3)
Creatinine, Ser: 4.31 mg/dL — ABNORMAL HIGH (ref 0.61–1.24)
GFR calc Af Amer: 17 mL/min — ABNORMAL LOW (ref >60)
GFR, EST NON AFRICAN AMERICAN: 14 mL/min — AB (ref >60)
Glucose, Bld: 147 mg/dL — ABNORMAL HIGH (ref 65–99)
POTASSIUM: 4.1 mmol/L (ref 3.5–5.1)
Sodium: 140 mmol/L (ref 135–145)
TOTAL PROTEIN: 5.7 g/dL — AB (ref 6.5–8.1)

## 2017-09-05 LAB — CBC
HEMATOCRIT: 28.9 % — AB (ref 39.0–52.0)
HEMOGLOBIN: 8.6 g/dL — AB (ref 13.0–17.0)
MCH: 24.7 pg — ABNORMAL LOW (ref 26.0–34.0)
MCHC: 29.8 g/dL — AB (ref 30.0–36.0)
MCV: 83 fL (ref 78.0–100.0)
Platelets: 176 10*3/uL (ref 150–400)
RBC: 3.48 MIL/uL — ABNORMAL LOW (ref 4.22–5.81)
RDW: 16 % — AB (ref 11.5–15.5)
WBC: 10.1 10*3/uL (ref 4.0–10.5)

## 2017-09-05 LAB — PHOSPHORUS: Phosphorus: 4.4 mg/dL (ref 2.5–4.6)

## 2017-09-05 LAB — PROTIME-INR
INR: 1.21
Prothrombin Time: 15.2 s (ref 11.4–15.2)

## 2017-09-05 LAB — CALCIUM, IONIZED: Calcium, Ionized, Serum: 4 mg/dL — ABNORMAL LOW (ref 4.5–5.6)

## 2017-09-05 LAB — MAGNESIUM: MAGNESIUM: 1.8 mg/dL (ref 1.7–2.4)

## 2017-09-05 MED ORDER — AMIODARONE HCL 200 MG PO TABS
200.0000 mg | ORAL_TABLET | Freq: Two times a day (BID) | ORAL | Status: DC
  Administered 2017-09-05 – 2017-09-10 (×11): 200 mg via ORAL
  Filled 2017-09-05 (×11): qty 1

## 2017-09-05 MED ORDER — SODIUM CHLORIDE 0.9 % IV SOLN
125.0000 mg | Freq: Every day | INTRAVENOUS | Status: DC
  Administered 2017-09-05 – 2017-09-10 (×6): 125 mg via INTRAVENOUS
  Filled 2017-09-05 (×11): qty 10

## 2017-09-05 MED FILL — Potassium Chloride Inj 2 mEq/ML: INTRAVENOUS | Qty: 40 | Status: AC

## 2017-09-05 MED FILL — Heparin Sodium (Porcine) Inj 1000 Unit/ML: INTRAMUSCULAR | Qty: 30 | Status: AC

## 2017-09-05 MED FILL — Magnesium Sulfate Inj 50%: INTRAMUSCULAR | Qty: 10 | Status: AC

## 2017-09-05 NOTE — Progress Notes (Signed)
[facility] Rounding Note  Background:  52 year old [name] HTN, CM w/low EF (20-25), CAD, ESRD [facility] (on HD for 17 years!!), secondary HPT, anemia. S/P single vessel CABG/AoVR 2/35/36 complicated by shock requiring pressors. CRRT initiated via L fem cath 09/02/17.   Usual HD orders [facility] 4 hours and 45 min TTS  Left AVF  450/800- 2K/2.5 calc  EDW 140.5 2000 per hour of heparin Venofer 50 qTues Hect 12 mcg q tx/parsabiv 2.5 q tx  Recommendations/Plans 1. ESRD - usual TTS [facility]. CRRT post CABG/valve replacement d/t hemodynamic instability. Citrate anticoagulation. Fem vas cath placed 5/18. Pulling 50-100/hour per cards. EDW said to be 140.5 kg so still 7 kg over. Given chronic hypotension, would like to be a little closer to EDW before transitioning back to IHD.  2. Vascular access - has L AVF. Apparently all occluded up top, all lines have had to be femoral 3. Shock - currently NE at 10 + po midodrine. Off vaso. 4. S/p mechanical AoVR and single vessel (SVG to OM) CABG 5/17 .  On ASA and warfarin started per cards. Still requiring pressor support w/NE and midodrine added.  5. SVT/AF - NSR on amio at present.  6. Anemia - Hb <10 on last CBC, higher (as usual) on istat. No ESA as outpt. If serial CBC's show Hb consistently <10 will add Aranesp but hold for now.  TSat low at 10% so 8 doses Ferrlecit ordered as load 7.  Secondary HPT - normally on parsabiv (not available here) and hectorol- once converted to IHD will add back. Takes fosrenol 4 ac, 2-3 snacks at home. Phosphorus with CRRT looks fine so won't need to resume this until off CRRT.   Subjective:   CRRT started 5/18. Fem cath Citrate anticoagulation All 4K fluids 50-100 net neg  Now just midodrine and NE Still 16 lb over EDW No new c/o  Objective Vital signs in last 24 hours: Vitals:   09/05/17 0800 09/05/17 0815 09/05/17 0830 09/05/17 0845  BP: (!) 94/58 (!) 96/57 106/85 (!) 94/44  Pulse: 96 99  99 98  Resp: (!) 25 (!) 25 (!) 25 (!) 22  Temp:  98 F (36.7 C)    TempSrc:  Oral    SpO2: 100% 100% 100% 100%  Weight:      Height:       Weight change: -3 kg (-6 lb 9.8 oz)  Intake/Output Summary (Last 24 hours) at 09/05/2017 0943 Last data filed at 09/05/2017 0900 Gross per 24 hour  Intake 2840.98 ml  Output 5259 ml  Net -2418.02 ml   Physical Exam: Very pleasant [name] Weight noted 147.3 kg, EDW is 140.5 (says gets to that at outpt HD) Nasal cannula O2 S1S2 No S3 NSR on tele + JVD Ant lungs clear Chest tubes out Abd obese not tender SCD's in place, trace LE edema L FA AVF + bruit (but soft). Arm edematous.  L fem vas cath (5/17) running CRRT   Recent Labs  Lab 09/04/17 0535  09/04/17 1628 09/04/17 1951 09/04/17 1957 09/05/17 0300  NA 140   < > 138 139 136 140  K 4.3   < > 4.3 3.9 4.1 4.1  CL 95*   < > 93* 91* 91* 92*  CO2 28  --  31  --   --  34*  GLUCOSE 138*   < > 156* 193* 154* 147*  BUN 21*   < > 17 14 18 16   CREATININE 5.23*   < >  4.69* 3.10* 4.10* 4.31*  CALCIUM 9.5  --  10.3  --   --  10.0  PHOS 4.6  --  4.3  --   --  4.4   < > = values in this interval not displayed.    Recent Labs  Lab 09/03/17 0236  09/04/17 0535 09/04/17 1628 09/05/17 0300  AST 32  --   --   --  16  ALT 11*  --   --   --  10*  ALKPHOS 71  --   --   --  84  BILITOT 0.7  --   --   --  0.8  PROT 6.0*  --   --   --  5.7*  ALBUMIN 2.5*  2.5*   < > 2.4* 2.2* 2.3*   < > = values in this interval not displayed.    Recent Labs  Lab 09/02/17 0345 09/02/17 1556  09/03/17 0236  09/04/17 0535  09/04/17 0913  09/04/17 1415 09/04/17 1951 09/04/17 1957  WBC 10.3 14.0*  --  15.0*  --  13.7*  --  14.0*  --   --   --   --   HGB 10.9* 10.6*   < > 10.3*   < > 9.7*   < > 9.7*   < > 10.5* 11.2* 9.5*  HCT 36.0* 35.0*   < > 34.1*   < > 31.8*   < > 32.0*   < > 31.0* 33.0* 28.0*  MCV 81.6 81.4  --  81.6  --  81.5  --  82.3  --   --   --   --   PLT 271 264  --  229  --  214  --  207  --    --   --   --    < > = values in this interval not displayed.    Recent Labs  Lab 09/04/17 0517 09/04/17 0616 09/04/17 0706 09/04/17 0757 09/04/17 0917  GLUCAP 146* 139* 136* 129* 142*   Iron/TIBC/Ferritin/ %Sat    Component Value Date/Time   IRON 15 (L) 09/04/2017 0913   TIBC 151 (L) 09/04/2017 0913   IRONPCTSAT 10 (L) 09/04/2017 0913   Studies/Results: Dg Chest Port 1 View  Result Date: 09/05/2017 CLINICAL DATA:  CABG.  Sore chest.  History of CHF. EXAM: PORTABLE CHEST 1 VIEW COMPARISON:  09/04/2017. FINDINGS: Interim removal of bilateral chest tubes. No pneumothorax. Improved aeration of the lungs with mild residual bibasilar atelectasis. Prior CABG. Cardiomegaly with normal pulmonary vascularity. IMPRESSION: 1.  Interim removal of bilateral chest tubes.  No pneumothorax. 2. Improved aeration of the lungs with mild residual bibasilar atelectasis. 3. Prior CABG. Stable cardiomegaly. No pulmonary venous congestion. Electronically Signed   By: Marcello Moores  Register   On: 09/05/2017 09:18   Dg Chest Port 1 View  Result Date: 09/04/2017 CLINICAL DATA:  Chest tube in place EXAM: PORTABLE CHEST 1 VIEW COMPARISON:  Yesterday FINDINGS: Tracheal and esophageal extubation. Similar low lung volumes with perihilar atelectasis. Thoracic drains are in place. There is a Swan-Ganz catheter from below with looping at the main pulmonary artery/outflow tract. Cardiopericardial enlargement distorted by rotation. No visible pneumothorax. IMPRESSION: Extubation with stable low volumes and atelectasis. No visible pneumothorax. Electronically Signed   By: Monte Fantasia M.D.   On: 09/04/2017 07:18   Medications: Infusions: . sodium chloride Stopped (09/04/17 0700)  . sodium chloride    . calcium gluconate infusion for CRRT 60 mL/hr at 09/05/17 0600  .  norepinephrine (LEVOPHED) Adult infusion 10 mcg/min (09/05/17 0830)  . dialysate (PRISMASATE) 1,500 mL/hr at 09/05/17 0924  . dialysis replacement fluid  (prismasate) 350 mL/hr at 09/04/17 1951  . sodium citrate 2 %/dextrose 2.5% solution 3000 mL 250 mL/hr at 09/05/17 0511   Scheduled Medications: . acetaminophen (TYLENOL) oral liquid 160 mg/5 mL  1,000 mg Oral Q6H   Or  . acetaminophen  1,000 mg Oral Q6H  . amiodarone  200 mg Oral BID  . aspirin EC  81 mg Oral Daily  . atorvastatin  40 mg Oral q1800  . bisacodyl  10 mg Oral Daily   Or  . bisacodyl  10 mg Rectal Daily  . Chlorhexidine Gluconate Cloth  6 each Topical Daily  . docusate sodium  200 mg Oral Daily  . midodrine  10 mg Oral TID WC  . pantoprazole  40 mg Oral Daily  . sodium chloride flush  10-40 mL Intracatheter Q12H  . sodium chloride flush  3 mL Intravenous Q12H  . warfarin  2.5 mg Oral q1800  . Warfarin - Physician Dosing Inpatient   Does not apply q1800   Jamal Maes, MD [place] [phone] Pager 09/05/2017, 9:43 AM

## 2017-09-05 NOTE — Progress Notes (Signed)
Patient ID: [name] [name], male   DOB: [birth year], 53 y.o.   MRN: [id number]    Advanced Heart Failure Rounding Note  PCP-Cardiologist: Kate Sable, MD   Subjective:    Evening of 09/02/17 went into SVT at 160, started on amiodarone gtt.  He then went into atrial fibrillation in the 100s. This morning, he is in NSR on amiodarone.   Extubated 09/03/17.  Remains on CVVH pulling 50-100 cc an hour. Norepinephrine has been titrated down to 12, still on vasopressin 0.04.   Awake and alert this am. Not short of breath at rest.  [name] [name] now out.  Only central access if femoral.   Objective:   Weight Range: (!) 324 lb 11.8 oz (147.3 kg) Body mass index is 44.04 kg/m.   Vital Signs:   Temp:  [97.7 F (36.5 C)-98.7 F (37.1 C)] 97.8 F (36.6 C) (05/21 0337) Pulse Rate:  [41-127] 96 (05/21 0700) Resp:  [17-37] 26 (05/21 0700) BP: (85-112)/(45-74) 99/65 (05/21 0700) SpO2:  [80 %-100 %] 98 % (05/21 0700) Weight:  [324 lb 11.8 oz (147.3 kg)] 324 lb 11.8 oz (147.3 kg) (05/21 0500) Last BM Date: 08/31/17  Weight change: Filed Weights   09/03/17 0500 09/04/17 0400 09/05/17 0500  Weight: (!) 334 lb (151.5 kg) (!) 331 lb 5.6 oz (150.3 kg) (!) 324 lb 11.8 oz (147.3 kg)    Intake/Output:   Intake/Output Summary (Last 24 hours) at 09/05/2017 0733 Last data filed at 09/05/2017 0700 Gross per 24 hour  Intake 2830.34 ml  Output 5388 ml  Net -2557.66 ml      Physical Exam    General: NAD Neck: No JVD, no thyromegaly or thyroid nodule.  Lungs: Clear to auscultation bilaterally with normal respiratory effort. CV: Nonpalpable PMI.  Heart regular S1/S2 mechanical S2, no S3/S4, 1/6 SEM RUSB.  No peripheral edema.   Abdomen: Soft, nontender, no hepatosplenomegaly, no distention.  Skin: Intact without lesions or rashes.  Neurologic: Alert and oriented x 3.  Psych: Normal affect. Extremities: No clubbing or cyanosis.  HEENT: Normal.    Telemetry   NSR 90s-100s, personally reviewed.    Labs    CBC Recent Labs    09/04/17 0535  09/04/17 0913  09/04/17 1951 09/04/17 1957  WBC 13.7*  --  14.0*  --   --   --   HGB 9.7*   < > 9.7*   < > 11.2* 9.5*  HCT 31.8*   < > 32.0*   < > 33.0* 28.0*  MCV 81.5  --  82.3  --   --   --   PLT 214  --  207  --   --   --    < > = values in this interval not displayed.   Basic Metabolic Panel Recent Labs    09/04/17 0535  09/04/17 1628  09/04/17 1957 09/05/17 0300  NA 140   < > 138   < > 136 140  K 4.3   < > 4.3   < > 4.1 4.1  CL 95*   < > 93*   < > 91* 92*  CO2 28  --  31  --   --  34*  GLUCOSE 138*   < > 156*   < > 154* 147*  BUN 21*   < > 17   < > 18 16  CREATININE 5.23*   < > 4.69*   < > 4.10* 4.31*  CALCIUM 9.5  --  10.3  --   --  10.0  MG 1.9  --   --   --   --  1.8  PHOS 4.6  --  4.3  --   --  4.4   < > = values in this interval not displayed.   Liver Function Tests Recent Labs    09/03/17 0236  09/04/17 1628 09/05/17 0300  AST 32  --   --  16  ALT 11*  --   --  10*  ALKPHOS 71  --   --  84  BILITOT 0.7  --   --  0.8  PROT 6.0*  --   --  5.7*  ALBUMIN 2.5*  2.5*   < > 2.2* 2.3*   < > = values in this interval not displayed.   No results for input(s): LIPASE, AMYLASE in the last 72 hours. Cardiac Enzymes No results for input(s): CKTOTAL, CKMB, CKMBINDEX, TROPONINI in the last 72 hours.  BNP: BNP (last 3 results) Recent Labs    07/28/17 1831  BNP 1,098.0*    ProBNP (last 3 results) No results for input(s): PROBNP in the last 8760 hours.   D-Dimer No results for input(s): DDIMER in the last 72 hours. Hemoglobin A1C No results for input(s): HGBA1C in the last 72 hours. Fasting Lipid Panel No results for input(s): CHOL, HDL, LDLCALC, TRIG, CHOLHDL, LDLDIRECT in the last 72 hours. Thyroid Function Tests Recent Labs    09/03/17 0236  TSH 1.670    Other results:   Imaging    No results found.   Medications:     Scheduled Medications: . acetaminophen (TYLENOL) oral liquid 160  mg/5 mL  1,000 mg Oral Q6H   Or  . acetaminophen  1,000 mg Oral Q6H  . aspirin EC  81 mg Oral Daily  . atorvastatin  40 mg Oral q1800  . bisacodyl  10 mg Oral Daily   Or  . bisacodyl  10 mg Rectal Daily  . Chlorhexidine Gluconate Cloth  6 each Topical Daily  . docusate sodium  200 mg Oral Daily  . midodrine  10 mg Oral TID WC  . pantoprazole  40 mg Oral Daily  . sodium chloride flush  10-40 mL Intracatheter Q12H  . sodium chloride flush  3 mL Intravenous Q12H  . warfarin  2.5 mg Oral q1800  . Warfarin - Physician Dosing Inpatient   Does not apply q1800    Infusions: . sodium chloride Stopped (09/04/17 0700)  . sodium chloride    . amiodarone 30 mg/hr (09/05/17 0600)  . calcium gluconate infusion for CRRT 60 mL/hr at 09/05/17 0600  . norepinephrine (LEVOPHED) Adult infusion 12 mcg/min (09/05/17 3149)  . phenylephrine (NEO-SYNEPHRINE) Adult infusion Stopped (09/01/17 1900)  . dialysate (PRISMASATE) 1,500 mL/hr at 09/05/17 0521  . dialysis replacement fluid (prismasate) 350 mL/hr at 09/04/17 1951  . sodium citrate 2 %/dextrose 2.5% solution 3000 mL 250 mL/hr at 09/05/17 0511  . vasopressin (PITRESSIN) infusion - *FOR SHOCK* 0.04 Units/min (09/05/17 0600)    PRN Medications: sodium chloride, heparin, morphine injection, ondansetron (ZOFRAN) IV, oxyCODONE, sodium chloride flush, sodium chloride flush, traMADol   Assessment/Plan   1. 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 No active vegetation seen.  Blood cultures werenegativeduring 4/19 hospitalization. Severe AI could certainly potentiate CHF. This was not see on 2012 echo. The LV is not significantly dilated, so not sure AI is the only explanation for his  cardiomyopathy. s/p mechanical AVR 09/01/17.  - Continue ASA, warfarin started.   - Will eventually need post op Echo.  2. Acute on chronic  systolic CHF/cardiogenic shock: Echo 4/19with EF 20-25%. As LV is not markedly dilated, I am not sure that aortic insufficiency can be invoked as the only cause for cardiomyopathy. He has hadchronic sinus tachycardia x years but HR has been in 100s-110s, do not think this would have been likely to cause a tachy-mediated CMP. He has serial 80% stenoses in the LCx, but do not think this can explain all of the cardiomyopathy either. It is possible thatcardiomyopathyis multifactorial. Post-op, he has been hypotensive.  However, at baseline his BP is marginal and he requires midodrine with HD. He has had reasonable cardiac output by [name] [name] (now removed) but remains on norepinephrine now at 12 and vasopressin. No fever, WBCs not significantly elevated, CXR ok => do not think septic shock.  No line for CVP.  Weight is above baseline but does not look significantly volume overloaded on exam.  - Continue midodrine 10 mg tid. BP low/marginal at baseline (requires midodrine for HD) - Wean pressors as tolerated (MAP 60, SBP 85 goal) => stop vasopressin today.  - Continue gentle fluid removal via CVVH, currently taking 50-100 cc/hr off. Need to get him back on HD soon to allow mobilization.  3. ESRD: TTS HD, now on CVVH.Need to get him back on HD to allow mobilization post-op.  4. GQQ:PYPPJKD 5/18 evening.  Now on amiodarone gtt with improvement. Can transition to po.  5. CAD: S/p SVG-OM.  - Continue statin and ASA.  6. Atrial fibrillation:  He is in NSR this am on amiodarone.  - transition to po amiodarone.  - Coumadin started 09/03/17. TCTS dosing.   Length of Stay: 4  Loralie Champagne, MD  09/05/2017, 7:33 AM  Advanced Heart Failure Team Pager [phone] (M-F; 7a - 4p)  Please contact [facility] for night-coverage after hours (4p -7a ) and weekends on [email or web address]

## 2017-09-05 NOTE — Progress Notes (Signed)
[address]       [place] [address]             [phone]        CARDIOTHORACIC SURGERY PROGRESS NOTE   R4 Days Post-Op Procedure(s) (LRB): AORTIC VALVE REPLACEMENT (AVR) (N/A) CORONARY ARTERY BYPASS GRAFTING (CABG) X 1, USING RIGHT GREATER SAPHENOUS VEIN HARVESTED ENDOSCOPICALLY (N/A) TRANSESOPHAGEAL ECHOCARDIOGRAM (TEE) (N/A) FLOROSCOPY GUIDED PLACEMENT OF RIGHT FEMORAL CENTRAL LINE TIMES TWO  AND PLACEMENT OF SWAN GANZ CATHETER (Right) PLACEMENT OF TRIALYSIS SHORT TERM DIALYSIS CATHETER  Subjective: Looks good and feels well.  Very mild soreness in chest.  No SOB.  Poor appetite but tolerating liquids  Objective: Vital signs: BP Readings from Last 1 Encounters:  09/05/17 (!) 94/58   Pulse Readings from Last 1 Encounters:  09/05/17 96   Resp Readings from Last 1 Encounters:  09/05/17 (!) 25   Temp Readings from Last 1 Encounters:  09/05/17 98 F (36.7 C) (Oral)    Hemodynamics:    Physical Exam:  Rhythm:   sinus  Breath sounds: clear  Heart sounds:  RRR  Incisions:  Clean and dry  Abdomen:  Soft, non-distended, non-tender  Extremities:  Warm, well-perfused    Intake/Output from previous day: 05/20 0701 - 05/21 0700 In: 2883.9 [I.V.:2883.9] Out: 5388 [Urine:5; Chest Tube:30] Intake/Output this shift: Total I/O In: 109.3 [I.V.:109.3] Out: 187 [Other:187]  Lab Results:  CBC: Recent Labs    09/04/17 0535  09/04/17 0913  09/04/17 1951 09/04/17 1957  WBC 13.7*  --  14.0*  --   --   --   HGB 9.7*   < > 9.7*   < > 11.2* 9.5*  HCT 31.8*   < > 32.0*   < > 33.0* 28.0*  PLT 214  --  207  --   --   --    < > = values in this interval not displayed.    BMET:  Recent Labs    09/04/17 1628  09/04/17 1957 09/05/17 0300  NA 138   < > 136 140  K 4.3   < > 4.1 4.1  CL 93*   < > 91* 92*  CO2 31  --   --  34*  GLUCOSE 156*   < > 154* 147*  BUN 17   < > 18 16  CREATININE 4.69*   < > 4.10* 4.31*  CALCIUM 10.3  --   --  10.0   < > = values in this interval not displayed.     PT/INR:   Recent Labs    09/04/17 0535  LABPROT 16.1*  INR 1.30    CBG (last 3)  Recent Labs    09/04/17 0706 09/04/17 0757 09/04/17 0917  GLUCAP 136* 129* 142*    ABG    Component Value Date/Time   PHART 7.409 09/03/2017 1110   PCO2ART 36.4 09/03/2017 1110   PO2ART 67.0 (L) 09/03/2017 1110   HCO3 23.5 09/03/2017 1110   TCO2 33 (H) 09/04/2017 1957   ACIDBASEDEF 1.0 09/03/2017 1110   O2SAT 43.2 09/04/2017 0848    CXR: clear  Assessment/Plan: S/P Procedure(s) (LRB): AORTIC VALVE REPLACEMENT (AVR) (N/A) CORONARY ARTERY BYPASS GRAFTING (CABG) X 1, USING RIGHT GREATER SAPHENOUS VEIN HARVESTED ENDOSCOPICALLY (N/A) TRANSESOPHAGEAL ECHOCARDIOGRAM (TEE) (N/A) FLOROSCOPY GUIDED PLACEMENT OF RIGHT FEMORAL CENTRAL LINE TIMES TWO  AND PLACEMENT OF SWAN GANZ CATHETER (Right) PLACEMENT OF TRIALYSIS SHORT TERM DIALYSIS CATHETER  Overall clinically stable and maintaining NSR w/out any  further episodes of SVT on IV amiodarone Weaning levophed and off vasopressin - patient w/ chronic hypotension preop  Breathing comfortably w/ O2 sats 100% on 4 L/min and CXR clear ESRD on CVVHD - I/O's negative 2.5 liters yesterday and weight down 7 lbs still 16 lbs > preop Acute on chronic combined systolic and diastolic CHF with expected post-op volume excess Expected post op acute blood loss anemia with preop anemia of chronic disease assoc w/ ESRD   Continue midodrine and wean levophed as tolerated - expect SBP 85-95  Continue CVVHD for now - will need to get temporary dialysis catheter out reasonably soon so as to get patient up and out of bed  Continue amiodarone  Continue Coumadin    [name], MD 09/05/2017 8:31 AM

## 2017-09-05 NOTE — Progress Notes (Signed)
TCTS BRIEF SICU PROGRESS NOTE  4 Days Post-Op  S/P Procedure(s) (LRB): AORTIC VALVE REPLACEMENT (AVR) (N/A) CORONARY ARTERY BYPASS GRAFTING (CABG) X 1, USING RIGHT GREATER SAPHENOUS VEIN HARVESTED ENDOSCOPICALLY (N/A) TRANSESOPHAGEAL ECHOCARDIOGRAM (TEE) (N/A) FLOROSCOPY GUIDED PLACEMENT OF RIGHT FEMORAL CENTRAL LINE TIMES TWO  AND PLACEMENT OF SWAN GANZ CATHETER (Right) PLACEMENT OF TRIALYSIS SHORT TERM DIALYSIS CATHETER   Stable day NSR w/ stable BP, now off all drips O2 sats 96-97% Tolerating some volume removal via CVVHD  Plan: Continue current plan  [name], MD 09/05/2017 6:36 PM

## 2017-09-05 NOTE — Progress Notes (Signed)
Stopped in and greeting [name] [name] while rounding the floor.  Patient has family around enjoying some good smelling breakfast.  [name] [name] says all he can do is smell it.  Providing ministry of presence and compassionate listening as he talks about how much better he feels since all this first happened.  Will continue to follow up as needed.    09/05/17 1008  Clinical Encounter Type  Visited With Patient;Family  Visit Type Initial;Spiritual support;Post-op

## 2017-09-06 ENCOUNTER — Inpatient Hospital Stay (HOSPITAL_COMMUNITY): Payer: Medicare Other

## 2017-09-06 LAB — CBC WITH DIFFERENTIAL/PLATELET
ABS IMMATURE GRANULOCYTES: 0 10*3/uL (ref 0.0–0.1)
BASOS ABS: 0 10*3/uL (ref 0.0–0.1)
BASOS PCT: 0 %
EOS ABS: 0.5 10*3/uL (ref 0.0–0.7)
Eosinophils Relative: 6 %
HCT: 29 % — ABNORMAL LOW (ref 39.0–52.0)
Hemoglobin: 8.6 g/dL — ABNORMAL LOW (ref 13.0–17.0)
IMMATURE GRANULOCYTES: 0 %
Lymphocytes Relative: 10 %
Lymphs Abs: 0.9 10*3/uL (ref 0.7–4.0)
MCH: 24.7 pg — ABNORMAL LOW (ref 26.0–34.0)
MCHC: 29.7 g/dL — ABNORMAL LOW (ref 30.0–36.0)
MCV: 83.3 fL (ref 78.0–100.0)
Monocytes Absolute: 0.7 10*3/uL (ref 0.1–1.0)
Monocytes Relative: 9 %
NEUTROS ABS: 6.4 10*3/uL (ref 1.7–7.7)
NEUTROS PCT: 75 %
PLATELETS: 155 10*3/uL (ref 150–400)
RBC: 3.48 MIL/uL — AB (ref 4.22–5.81)
RDW: 16 % — AB (ref 11.5–15.5)
WBC: 8.5 10*3/uL (ref 4.0–10.5)

## 2017-09-06 LAB — POCT I-STAT, CHEM 8
BUN: 12 mg/dL (ref 6–20)
BUN: 18 mg/dL (ref 6–20)
CHLORIDE: 86 mmol/L — AB (ref 101–111)
CHLORIDE: 89 mmol/L — AB (ref 101–111)
Calcium, Ion: 1.21 mmol/L (ref 1.15–1.40)
Calcium, Ion: <0.3 mmol/L — CL (ref 1.15–1.40)
Creatinine, Ser: 2.5 mg/dL — ABNORMAL HIGH (ref 0.61–1.24)
Creatinine, Ser: 3.8 mg/dL — ABNORMAL HIGH (ref 0.61–1.24)
GLUCOSE: 196 mg/dL — AB (ref 65–99)
Glucose, Bld: 107 mg/dL — ABNORMAL HIGH (ref 65–99)
HEMATOCRIT: 29 % — AB (ref 39.0–52.0)
HEMATOCRIT: 31 % — AB (ref 39.0–52.0)
HEMOGLOBIN: 10.5 g/dL — AB (ref 13.0–17.0)
HEMOGLOBIN: 9.9 g/dL — AB (ref 13.0–17.0)
POTASSIUM: 3.5 mmol/L (ref 3.5–5.1)
POTASSIUM: 3.5 mmol/L (ref 3.5–5.1)
SODIUM: 139 mmol/L (ref 135–145)
SODIUM: 141 mmol/L (ref 135–145)
TCO2: 31 mmol/L (ref 22–32)
TCO2: 37 mmol/L — ABNORMAL HIGH (ref 22–32)

## 2017-09-06 LAB — ACID FAST SMEAR (AFB, MYCOBACTERIA)

## 2017-09-06 LAB — RENAL FUNCTION PANEL
ALBUMIN: 2.1 g/dL — AB (ref 3.5–5.0)
ANION GAP: 12 (ref 5–15)
BUN: 17 mg/dL (ref 6–20)
CHLORIDE: 90 mmol/L — AB (ref 101–111)
CO2: 39 mmol/L — ABNORMAL HIGH (ref 22–32)
Calcium: 10.5 mg/dL — ABNORMAL HIGH (ref 8.9–10.3)
Creatinine, Ser: 4.11 mg/dL — ABNORMAL HIGH (ref 0.61–1.24)
GFR, EST AFRICAN AMERICAN: 18 mL/min — AB (ref >60)
GFR, EST NON AFRICAN AMERICAN: 15 mL/min — AB (ref >60)
Glucose, Bld: 113 mg/dL — ABNORMAL HIGH (ref 65–99)
PHOSPHORUS: 3.8 mg/dL (ref 2.5–4.6)
POTASSIUM: 3.7 mmol/L (ref 3.5–5.1)
Sodium: 141 mmol/L (ref 135–145)

## 2017-09-06 LAB — PROTIME-INR
INR: 1.21
PROTHROMBIN TIME: 15.2 s (ref 11.4–15.2)

## 2017-09-06 LAB — GLUCOSE, CAPILLARY: Glucose-Capillary: 71 mg/dL (ref 65–99)

## 2017-09-06 LAB — AEROBIC/ANAEROBIC CULTURE W GRAM STAIN (SURGICAL/DEEP WOUND)
Culture: NO GROWTH
Gram Stain: NONE SEEN

## 2017-09-06 LAB — ACID FAST SMEAR (AFB): ACID FAST SMEAR - AFSCU2: NEGATIVE

## 2017-09-06 LAB — AEROBIC/ANAEROBIC CULTURE (SURGICAL/DEEP WOUND)

## 2017-09-06 LAB — CALCIUM, IONIZED: Calcium, Ionized, Serum: 4.1 mg/dL — ABNORMAL LOW (ref 4.5–5.6)

## 2017-09-06 LAB — MAGNESIUM: MAGNESIUM: 2 mg/dL (ref 1.7–2.4)

## 2017-09-06 MED ORDER — AMIODARONE IV BOLUS ONLY 150 MG/100ML
150.0000 mg | Freq: Once | INTRAVENOUS | Status: AC
  Administered 2017-09-06: 150 mg via INTRAVENOUS

## 2017-09-06 MED ORDER — LANTHANUM CARBONATE 500 MG PO CHEW
4000.0000 mg | CHEWABLE_TABLET | Freq: Three times a day (TID) | ORAL | Status: DC
  Filled 2017-09-06: qty 8

## 2017-09-06 MED ORDER — ENOXAPARIN SODIUM 30 MG/0.3ML ~~LOC~~ SOLN
30.0000 mg | Freq: Every day | SUBCUTANEOUS | Status: DC
  Administered 2017-09-06: 30 mg via SUBCUTANEOUS
  Filled 2017-09-06: qty 0.3

## 2017-09-06 MED ORDER — WARFARIN SODIUM 5 MG PO TABS
5.0000 mg | ORAL_TABLET | Freq: Every day | ORAL | Status: DC
  Administered 2017-09-06 – 2017-09-09 (×4): 5 mg via ORAL
  Filled 2017-09-06 (×4): qty 1

## 2017-09-06 MED ORDER — LANTHANUM CARBONATE 500 MG PO CHEW
2000.0000 mg | CHEWABLE_TABLET | Freq: Three times a day (TID) | ORAL | Status: DC
  Administered 2017-09-06 – 2017-09-09 (×5): 2000 mg via ORAL
  Filled 2017-09-06 (×12): qty 4

## 2017-09-06 MED ORDER — NEPRO/CARBSTEADY PO LIQD
237.0000 mL | Freq: Three times a day (TID) | ORAL | Status: DC
Start: 2017-09-06 — End: 2017-09-10
  Administered 2017-09-06 – 2017-09-07 (×2): 237 mL via ORAL
  Filled 2017-09-06 (×12): qty 237

## 2017-09-06 MED ORDER — DARBEPOETIN ALFA 100 MCG/0.5ML IJ SOSY
100.0000 ug | PREFILLED_SYRINGE | INTRAMUSCULAR | Status: DC
  Administered 2017-09-07: 100 ug via INTRAVENOUS
  Filled 2017-09-06: qty 0.5

## 2017-09-06 MED ORDER — AMIODARONE HCL IN DEXTROSE 360-4.14 MG/200ML-% IV SOLN
INTRAVENOUS | Status: AC
  Filled 2017-09-06: qty 200

## 2017-09-06 MED ORDER — RENA-VITE PO TABS
1.0000 | ORAL_TABLET | Freq: Every day | ORAL | Status: DC
  Administered 2017-09-06 – 2017-09-09 (×4): 1 via ORAL
  Filled 2017-09-06 (×4): qty 1

## 2017-09-06 MED ORDER — ATORVASTATIN CALCIUM 40 MG PO TABS
40.0000 mg | ORAL_TABLET | Freq: Every day | ORAL | Status: DC
  Administered 2017-09-06 – 2017-09-09 (×4): 40 mg via ORAL
  Filled 2017-09-06 (×4): qty 1

## 2017-09-06 MED FILL — Mannitol IV Soln 20%: INTRAVENOUS | Qty: 1000 | Status: AC

## 2017-09-06 MED FILL — Lidocaine HCl Local Soln Prefilled Syringe 100 MG/5ML (2%): INTRAMUSCULAR | Qty: 25 | Status: AC

## 2017-09-06 MED FILL — Electrolyte-R (PH 7.4) Solution: INTRAVENOUS | Qty: 3000 | Status: AC

## 2017-09-06 MED FILL — Magnesium Sulfate Inj 50%: INTRAMUSCULAR | Qty: 10 | Status: AC

## 2017-09-06 MED FILL — Sodium Bicarbonate IV Soln 8.4%: INTRAVENOUS | Qty: 50 | Status: AC

## 2017-09-06 MED FILL — Sodium Chloride IV Soln 0.9%: INTRAVENOUS | Qty: 8000 | Status: AC

## 2017-09-06 MED FILL — Heparin Sodium (Porcine) Inj 1000 Unit/ML: INTRAMUSCULAR | Qty: 20 | Status: AC

## 2017-09-06 NOTE — Progress Notes (Signed)
Patient ID: [name] [name], male   DOB: 1965/11/24, 52 y.o.   MRN: [id number] P    Advanced Heart Failure Rounding Note  PCP-Cardiologist: Kate Sable, MD   Subjective:    Evening of 09/02/17 went into SVT at 160, started on amiodarone gtt.  He then went into atrial fibrillation in the 100s. This morning, he is in NSR on amiodarone.   Extubated 09/03/17.  Remains on CVVH pulling 50-100 cc an hour. He is now off pressors.  Weight down to 144 kg.   Awake and alert this am. Not short of breath at rest.    Objective:   Weight Range: (!) 318 lb 2 oz (144.3 kg) Body mass index is 43.15 kg/m.   Vital Signs:   Temp:  [98 F (36.7 C)-98.2 F (36.8 C)] 98 F (36.7 C) (05/22 0300) Pulse Rate:  [34-107] 104 (05/22 0800) Resp:  [14-26] 22 (05/22 0800) BP: (72-106)/(36-85) 94/84 (05/22 0800) SpO2:  [94 %-100 %] 97 % (05/22 0800) Weight:  [318 lb 2 oz (144.3 kg)] 318 lb 2 oz (144.3 kg) (05/22 0328) Last BM Date: 08/31/17  Weight change: Filed Weights   09/04/17 0400 09/05/17 0500 09/06/17 0328  Weight: (!) 331 lb 5.6 oz (150.3 kg) (!) 324 lb 11.8 oz (147.3 kg) (!) 318 lb 2 oz (144.3 kg)    Intake/Output:   Intake/Output Summary (Last 24 hours) at 09/06/2017 0818 Last data filed at 09/06/2017 0800 Gross per 24 hour  Intake 2034.21 ml  Output 3835 ml  Net -1800.79 ml      Physical Exam    General: NAD Neck: No JVD, no thyromegaly or thyroid nodule.  Lungs: Clear to auscultation bilaterally with normal respiratory effort. CV: Nondisplaced PMI.  Heart regular S1/S2 with mechanical S2, no S3/S4, 1/6 SEM RUSB.  No peripheral edema.   Abdomen: Soft, nontender, no hepatosplenomegaly, no distention.  Skin: Intact without lesions or rashes.  Neurologic: Alert and oriented x 3.  Psych: Normal affect. Extremities: No clubbing or cyanosis.  HEENT: Normal.    Telemetry   NSR 90s, personally reviewed.   Labs    CBC Recent Labs    09/05/17 1315  09/06/17 0306 09/06/17 0318  09/06/17 0321  WBC 10.1  --  8.5  --   --   NEUTROABS  --   --  6.4  --   --   HGB 8.6*   < > 8.6* 9.9* 10.5*  HCT 28.9*   < > 29.0* 29.0* 31.0*  MCV 83.0  --  83.3  --   --   PLT 176  --  155  --   --    < > = values in this interval not displayed.   Basic Metabolic Panel Recent Labs    09/05/17 0300  09/05/17 1628  09/06/17 0306 09/06/17 0318 09/06/17 0321  NA 140   < > 142   < > 141 139 141  K 4.1   < > 3.8   < > 3.7 3.5 3.5  CL 92*   < > 93*   < > 90* 86* 89*  CO2 34*  --  37*  --  39*  --   --   GLUCOSE 147*   < > 121*   < > 113* 107* 196*  BUN 16   < > 16   < > 17 18 12   CREATININE 4.31*   < > 4.08*   < > 4.11* 3.80* 2.50*  CALCIUM 10.0  --  10.4*  --  10.5*  --   --   MG 1.8  --   --   --  2.0  --   --   PHOS 4.4  --  4.1  --  3.8  --   --    < > = values in this interval not displayed.   Liver Function Tests Recent Labs    09/05/17 0300 09/05/17 1628 09/06/17 0306  AST 16  --   --   ALT 10*  --   --   ALKPHOS 84  --   --   BILITOT 0.8  --   --   PROT 5.7*  --   --   ALBUMIN 2.3* 2.1* 2.1*   No results for input(s): LIPASE, AMYLASE in the last 72 hours. Cardiac Enzymes No results for input(s): CKTOTAL, CKMB, CKMBINDEX, TROPONINI in the last 72 hours.  BNP: BNP (last 3 results) Recent Labs    07/28/17 1831  BNP 1,098.0*    ProBNP (last 3 results) No results for input(s): PROBNP in the last 8760 hours.   D-Dimer No results for input(s): DDIMER in the last 72 hours. Hemoglobin A1C No results for input(s): HGBA1C in the last 72 hours. Fasting Lipid Panel No results for input(s): CHOL, HDL, LDLCALC, TRIG, CHOLHDL, LDLDIRECT in the last 72 hours. Thyroid Function Tests No results for input(s): TSH, T4TOTAL, T3FREE, THYROIDAB in the last 72 hours.  Invalid input(s): FREET3  Other results:   Imaging    No results found.   Medications:     Scheduled Medications: . acetaminophen (TYLENOL) oral liquid 160 mg/5 mL  1,000 mg Oral Q6H   Or    . acetaminophen  1,000 mg Oral Q6H  . amiodarone  200 mg Oral BID  . aspirin EC  81 mg Oral Daily  . atorvastatin  40 mg Oral q1800  . bisacodyl  10 mg Oral Daily   Or  . bisacodyl  10 mg Rectal Daily  . Chlorhexidine Gluconate Cloth  6 each Topical Daily  . docusate sodium  200 mg Oral Daily  . enoxaparin (LOVENOX) injection  30 mg Subcutaneous Daily  . midodrine  10 mg Oral TID WC  . multivitamin  1 tablet Oral QHS  . pantoprazole  40 mg Oral Daily  . sodium chloride flush  10-40 mL Intracatheter Q12H  . sodium chloride flush  3 mL Intravenous Q12H  . warfarin  5 mg Oral q1800  . Warfarin - Physician Dosing Inpatient   Does not apply q1800    Infusions: . sodium chloride Stopped (09/04/17 0700)  . sodium chloride    . calcium gluconate infusion for CRRT 20 g (09/06/17 0519)  . ferric gluconate (FERRLECIT/NULECIT) IV Stopped (09/05/17 1301)  . dialysate (PRISMASATE) 1,500 mL/hr at 09/06/17 0519  . dialysis replacement fluid (prismasate) 350 mL/hr at 09/06/17 0519  . sodium citrate 2 %/dextrose 2.5% solution 3000 mL 250 mL/hr at 09/06/17 0519    PRN Medications: sodium chloride, heparin, morphine injection, ondansetron (ZOFRAN) IV, oxyCODONE, sodium chloride flush, sodium chloride flush, traMADol   Assessment/Plan   1. 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 No active vegetation seen.  Blood cultures werenegativeduring 4/19 hospitalization. Severe AI could certainly potentiate CHF. This was not see on 2012 echo. The LV is not significantly dilated, so not sure AI is the only explanation for his cardiomyopathy. s/p mechanical AVR 09/01/17.  -  Continue ASA, warfarin started.  INR 1.2 today.   - Will eventually need post op Echo.  2. Acute on chronic systolic CHF/cardiogenic shock: Echo 4/19with EF 20-25%. As LV is not markedly dilated, I am  not sure that aortic insufficiency can be invoked as the only cause for cardiomyopathy. He has hadchronic sinus tachycardia x years but HR has been in 100s-110s, do not think this would have been likely to cause a tachy-mediated CMP. He has serial 80% stenoses in the LCx, but do not think this can explain all of the cardiomyopathy either. It is possible thatcardiomyopathyis multifactorial. Post-op, he has been hypotensive.  However, at baseline his BP is marginal and he requires midodrine with HD. He has had reasonable cardiac output by [name] (now removed).  He is now off pressors.  Dry weight per Dr. Lorrene Reid is around 140 kg, he is down to 144 kg today.  - Continue midodrine 10 mg tid. BP low/marginal at baseline (requires midodrine for HD) - Continue gentle fluid removal via CVVH, currently taking 50-100 cc/hr off. Need to get him back on HD soon to allow mobilization. Will see if Dr. Lorrene Reid wants more CVVH today as above dry weight or to HD.  3. ESRD: TTS HD, now on CVVH.Need to get him back on HD to allow mobilization post-op.  4. MOQ:HUTMLYY 5/18 evening.  Now on po amiodarone.  5. CAD: S/p SVG-OM.  - Continue statin and ASA.  6. Atrial fibrillation:  He is in NSR this am on amiodarone.  - Now on po amiodarone, would probably continue a month or so post-op then try him off.  - Coumadin started 09/03/17. TCTS dosing.   Length of Stay: [place], MD  09/06/2017, 8:18 AM  Advanced Heart Failure Team Pager [phone] (M-F; 7a - 4p)  Please contact [facility] for night-coverage after hours (4p -7a ) and weekends on [email or web address]

## 2017-09-06 NOTE — Progress Notes (Signed)
CT surgery p.m. Rounds  Patient examined and record reviewed.Hemodynamics stable,labs satisfactory.Patient had stable day.Continue current care. [name] [name] 09/06/2017

## 2017-09-06 NOTE — Progress Notes (Signed)
Nutrition Follow-up  DOCUMENTATION CODES:   Morbid obesity  INTERVENTION:   -Nepro Shake po TID, each supplement provides 425 kcal and 19 grams protein  -Magic Cup BID on meal trays  -Continue Rena-Vit daily   NUTRITION DIAGNOSIS:   Inadequate oral intake related to inability to eat as evidenced by NPO status.  Being addressed via supplements  GOAL:   Patient will meet greater than or equal to 90% of their needs  Not Met  MONITOR:   PO intake, Supplement acceptance, Labs, Weight trends, Skin  REASON FOR ASSESSMENT:   Ventilator    ASSESSMENT:    52 y/o male PMHx HTN, dHF, ESRD on HD, morbid obesity, CAD. Presented for ELECTIVE AV replacement and CABGx1 5/17 d/t AI and CAD. Postop, remaned intubated d/t hemodynamic instability/hypotension and started on CRRT. Extubated on 5/19   5/19 Extubated 5/21 Diet advanced to Renal   EDW 140.5 kg, current wt 144 kg, down form 153 kg on admission  Pt reports very poor appetite, did not eat breakfast this AM. Pt reports ate home he does not take liquid protein supplements but supplements with Zone protein bars. Encouraged wife to bring these in if this is what pt prefers; pt is agreeable to oral nutrition supplements at this time CRRT stopped today, trial of iHD today  Labs: CBGs wdl, phosphorus 3.8 (wdl), potassium 3.5 (wdl), corrected calcium 12.0 (albumin 2.1) Meds: fosrenol, ferric gluconate, rena-vit   Diet Order:   Diet Order           Diet renal with fluid restriction Room service appropriate? Yes; Fluid consistency: Thin  Diet effective now          EDUCATION NEEDS:   No education needs have been identified at this time  Skin:  Skin Assessment: Reviewed RN Assessment  Last BM:  Unknown  Height:   Ht Readings from Last 1 Encounters:  09/01/17 6' (1.829 m)    Weight:   Wt Readings from Last 1 Encounters:  09/06/17 (!) 318 lb 2 oz (144.3 kg)    Ideal Body Weight:  80.91 kg  BMI:  Body mass  index is 43.15 kg/m.  Estimated Nutritional Needs:   Kcal:  0998-3382 kcals  Protein:  140 g  Fluid:  1000 mL plus UOP   [employer]ner MS, RD, LDN, CNSC [phone] Pager  [phone] Weekend/On-Call Pager

## 2017-09-06 NOTE — Progress Notes (Signed)
[facility] Rounding Note  Background:  52 year old [name] HTN, CM w/low EF (20-25), CAD, ESRD [facility] (on HD for 17 years!!), secondary HPT, anemia, chronic hypotension, central vein occlusions. S/P single vessel CABG/AoVR 4/54/09 complicated by shock requiring pressors. CRRT initiated via L fem cath 09/02/17 - 5/22. Transitioned to IHD 5/23  Usual HD orders [facility] 4 hours and 45 min TTS  Left AVF  450/800- 2K/2.5 calc  EDW 140.5 2000 per hour of heparin Venofer 50 qTues Hect 12 mcg q tx/parsabiv 2.5 q tx  Assessment/Recommendations   1. ESRD - usual TTS [facility]. CRRT post CABG/valve replacement d/t hemodynamic instability. Fem vas cath placed 5/18.  1. EDW 140.5 kg now down to 144.3.  2. I think could probably try to transition back to IHD, with a treatment tomorrow. 3. Remove fem cath 2. Vascular access - has L AVF. Apparently all occluded up top, all lines have had to be femoral 3. Shock - off IV pressors, on po midodrine. Hypotension chronic 4. S/p mechanical AoVR and single vessel (SVG to OM) CABG 5/17 . On ASA and warfarin started per cards.  5. SVT/AF - NSR on amio at present.  6. Anemia - Hb <10 on last CBC, higher (as usual) on istat. No ESA as outpt. If serial CBC's show Hb consistently <10 will add Aranesp but hold for now.   1. TSat low at 10% so 8 doses Ferrlecit ordered as load.  2. Add aranesp as last Hb on CBC was down to 8.6, start with 5/23 HD 7. Secondary HPT - normally on parsabiv (not available here) and hectorol. Off parsibiv has developed hypercalcemia and since not available here and Ca high will not start hectorol back now as would make ^ Ca worse.. Takes fosrenol 4 ac, 2-3 snacks at home. Resume since stopping CRRT  Jamal Maes, MD [place] Pager 09/06/2017, 10:24 AM    Subjective:    With CRRT now only 4 kg over EDW Feels better every day  Objective Vital signs in last 24 hours: Vitals:   09/06/17 0600 09/06/17 0700 09/06/17 0800 09/06/17 1000  BP: (!) 77/59 92/61 94/84  (!) 100/59  Pulse: (!) 34 95 (!) 104 89  Resp: 20 20 (!) 22 19  Temp:      TempSrc:      SpO2: 100% 96% 97% 100%  Weight:      Height:       Weight change: -3 kg (-6 lb 9.8 oz)  Intake/Output Summary (Last 24 hours) at 09/06/2017 1023 Last data filed at 09/06/2017 1000 Gross per 24 hour  Intake 2211.06 ml  Output 3761 ml  Net -1549.94 ml   Physical Exam: Very pleasant [name] Nasal cannula O2 S1S2 No S3 NSR on tele Ant lungs clear Chest tubes out Abd obese not tender SCD's in place, trace LE edema L FA AVF + bruit (but soft). Arm edematous.  L fem vas cath (5/17) running CRRT   Recent Labs  Lab 09/05/17 0300  09/05/17 1628  09/06/17 0306 09/06/17 0318 09/06/17 0321  NA 140   < > 142   < > 141 139 141  K 4.1   < > 3.8   < > 3.7 3.5 3.5  CL 92*   < > 93*   < > 90* 86* 89*  CO2 34*  --  37*  --  39*  --   --   GLUCOSE 147*   < > 121*   < >  113* 107* 196*  BUN 16   < > 16   < > 17 18 12   CREATININE 4.31*   < > 4.08*   < > 4.11* 3.80* 2.50*  CALCIUM 10.0  --  10.4*  --  10.5*  --   --   PHOS 4.4  --  4.1  --  3.8  --   --    < > = values in this interval not displayed.    Recent Labs  Lab 09/03/17 0236  09/05/17 0300 09/05/17 1628 09/06/17 0306  AST 32  --  16  --   --   ALT 11*  --  10*  --   --   ALKPHOS 71  --  84  --   --   BILITOT 0.7  --  0.8  --   --   PROT 6.0*  --  5.7*  --   --   ALBUMIN 2.5*  2.5*   < > 2.3* 2.1* 2.1*   < > = values in this interval not displayed.    Recent Labs  Lab 09/03/17 0236  09/04/17 0535  09/04/17 0913  09/05/17 1315  09/06/17 0306 09/06/17 0318 09/06/17 0321  WBC 15.0*  --  13.7*  --  14.0*  --  10.1  --  8.5  --   --   NEUTROABS  --   --   --   --   --   --   --   --  6.4  --   --   HGB 10.3*   < > 9.7*   < > 9.7*   < > 8.6*   < > 8.6* 9.9* 10.5*  HCT 34.1*   < > 31.8*   < > 32.0*   < > 28.9*   < > 29.0* 29.0* 31.0*  MCV 81.6  --   81.5  --  82.3  --  83.0  --  83.3  --   --   PLT 229  --  214  --  207  --  176  --  155  --   --    < > = values in this interval not displayed.    Recent Labs  Lab 09/04/17 0517 09/04/17 0616 09/04/17 0706 09/04/17 0757 09/04/17 0917  GLUCAP 146* 139* 136* 129* 142*   Iron/TIBC/Ferritin/ %Sat    Component Value Date/Time   IRON 15 (L) 09/04/2017 0913   TIBC 151 (L) 09/04/2017 0913   IRONPCTSAT 10 (L) 09/04/2017 0913   Studies/Results: Dg Chest Port 1 View  Result Date: 09/06/2017 CLINICAL DATA:  Atelectasis, recent aortic valve replacement and CABG, chest soreness. EXAM: PORTABLE CHEST 1 VIEW COMPARISON:  09/05/2017 and preoperative chest x-ray 08/21/2017. FINDINGS: Trachea is midline. Cardiomediastinal silhouette is enlarged, stable. Seven intact sternotomy wires. Lungs are low in volume with left perihilar and left basilar subsegmental atelectasis. Airspace opacification in the right upper lobe, as on 09/05/2017 but new from 08/21/2017. No definite pleural fluid or pneumothorax. Left thyroidectomy clips. IMPRESSION: 1. Low lung volumes with left perihilar and left basilar atelectasis. 2. Mild right upper lobe airspace opacification, possibly due to atelectasis. Pneumonia is not excluded. Electronically Signed   By: Lorin Picket M.D.   On: 09/06/2017 09:48   Dg Chest Port 1 View  Result Date: 09/05/2017 CLINICAL DATA:  CABG.  Sore chest.  History of CHF. EXAM: PORTABLE CHEST 1 VIEW COMPARISON:  09/04/2017. FINDINGS: Interim removal of bilateral chest tubes. No pneumothorax. Improved aeration  of the lungs with mild residual bibasilar atelectasis. Prior CABG. Cardiomegaly with normal pulmonary vascularity. IMPRESSION: 1.  Interim removal of bilateral chest tubes.  No pneumothorax. 2. Improved aeration of the lungs with mild residual bibasilar atelectasis. 3. Prior CABG. Stable cardiomegaly. No pulmonary venous congestion. Electronically Signed   By: Marcello Moores  Register   On: 09/05/2017  09:18   Medications: Infusions: . sodium chloride Stopped (09/04/17 0700)  . sodium chloride    . amiodarone    . calcium gluconate infusion for CRRT 20 g (09/06/17 0519)  . ferric gluconate (FERRLECIT/NULECIT) IV Stopped (09/05/17 1301)  . dialysate (PRISMASATE) 1,500 mL/hr at 09/06/17 0519  . dialysis replacement fluid (prismasate) 350 mL/hr at 09/06/17 0519  . sodium citrate 2 %/dextrose 2.5% solution 3000 mL 250 mL/hr at 09/06/17 0519   Scheduled Medications: . acetaminophen (TYLENOL) oral liquid 160 mg/5 mL  1,000 mg Oral Q6H   Or  . acetaminophen  1,000 mg Oral Q6H  . amiodarone  200 mg Oral BID  . aspirin EC  81 mg Oral Daily  . atorvastatin  40 mg Oral q1800  . bisacodyl  10 mg Oral Daily   Or  . bisacodyl  10 mg Rectal Daily  . Chlorhexidine Gluconate Cloth  6 each Topical Daily  . docusate sodium  200 mg Oral Daily  . enoxaparin (LOVENOX) injection  30 mg Subcutaneous Daily  . midodrine  10 mg Oral TID WC  . multivitamin  1 tablet Oral QHS  . pantoprazole  40 mg Oral Daily  . sodium chloride flush  10-40 mL Intracatheter Q12H  . sodium chloride flush  3 mL Intravenous Q12H  . warfarin  5 mg Oral q1800  . Warfarin - Physician Dosing Inpatient   Does not apply [phone]

## 2017-09-06 NOTE — Progress Notes (Signed)
[name] [name] [address]       [place] [address]             [phone]        CARDIOTHORACIC SURGERY PROGRESS NOTE   R5 Days Post-Op Procedure(s) (LRB): AORTIC VALVE REPLACEMENT (AVR) (N/A) CORONARY ARTERY BYPASS GRAFTING (CABG) X 1, USING RIGHT GREATER SAPHENOUS VEIN HARVESTED ENDOSCOPICALLY (N/A) TRANSESOPHAGEAL ECHOCARDIOGRAM (TEE) (N/A) FLOROSCOPY GUIDED PLACEMENT OF RIGHT FEMORAL CENTRAL LINE TIMES TWO  AND PLACEMENT OF SWAN GANZ CATHETER (Right) PLACEMENT OF TRIALYSIS SHORT TERM DIALYSIS CATHETER  Subjective: Feels pretty well.  Mild soreness in chest.  No SOB.  Appetite slowly improving  Objective: Vital signs: BP Readings from Last 1 Encounters:  09/06/17 (!) 92/52   Pulse Readings from Last 1 Encounters:  09/06/17 (!) 106   Resp Readings from Last 1 Encounters:  09/06/17 (!) 24   Temp Readings from Last 1 Encounters:  09/06/17 99.2 F (37.3 C) (Oral)    Hemodynamics:    Physical Exam:  Rhythm:   sinus  Breath sounds: clear  Heart sounds:  RRR  Incisions:  Clean and dry  Abdomen:  Soft, non-distended, non-tender  Extremities:  Warm, well-perfused    Intake/Output from previous day: 05/21 0701 - 05/22 0700 In: 2063.5 [P.O.:50; I.V.:1903.5; IV Piggyback:110] Out: 3870  Intake/Output this shift: Total I/O In: 690 [P.O.:240; I.V.:450] Out: 467 [Other:467]  Lab Results:  CBC: Recent Labs    09/05/17 1315  09/06/17 0306 09/06/17 0318 09/06/17 0321  WBC 10.1  --  8.5  --   --   HGB 8.6*   < > 8.6* 9.9* 10.5*  HCT 28.9*   < > 29.0* 29.0* 31.0*  PLT 176  --  155  --   --    < > = values in this interval not displayed.    BMET:  Recent Labs    09/05/17 1628  09/06/17 0306 09/06/17 0318 09/06/17 0321  NA 142   < > 141 139 141  K 3.8   < > 3.7 3.5 3.5  CL 93*   < > 90* 86* 89*  CO2 37*  --  39*  --   --   GLUCOSE 121*   < > 113* 107* 196*  BUN 16   < > 17 18 12   CREATININE 4.08*   < > 4.11* 3.80* 2.50*  CALCIUM 10.4*  --   10.5*  --   --    < > = values in this interval not displayed.     PT/INR:   Recent Labs    09/06/17 0306  LABPROT 15.2  INR 1.21    CBG (last 3)  Recent Labs    09/04/17 0757 09/04/17 0917 09/06/17 1229  GLUCAP 129* 142* 71    ABG    Component Value Date/Time   PHART 7.409 09/03/2017 1110   PCO2ART 36.4 09/03/2017 1110   PO2ART 67.0 (L) 09/03/2017 1110   HCO3 23.5 09/03/2017 1110   TCO2 31 09/06/2017 0321   ACIDBASEDEF 1.0 09/03/2017 1110   O2SAT 43.2 09/04/2017 0848    CXR: PORTABLE CHEST 1 VIEW  COMPARISON:  09/05/2017 and preoperative chest x-ray 08/21/2017.  FINDINGS: Trachea is midline. Cardiomediastinal silhouette is enlarged, stable. Seven intact sternotomy wires. Lungs are low in volume with left perihilar and left basilar subsegmental atelectasis. Airspace opacification in the right upper lobe, as on 09/05/2017 but new from 08/21/2017. No definite pleural fluid or pneumothorax. Left thyroidectomy clips.  IMPRESSION: 1. Low  lung volumes with left perihilar and left basilar atelectasis. 2. Mild right upper lobe airspace opacification, possibly due to atelectasis. Pneumonia is not excluded.   Electronically Signed   By: Lorin Picket M.D.   On: 09/06/2017 09:48   Assessment/Plan: S/P Procedure(s) (LRB): AORTIC VALVE REPLACEMENT (AVR) (N/A) CORONARY ARTERY BYPASS GRAFTING (CABG) X 1, USING RIGHT GREATER SAPHENOUS VEIN HARVESTED ENDOSCOPICALLY (N/A) TRANSESOPHAGEAL ECHOCARDIOGRAM (TEE) (N/A) FLOROSCOPY GUIDED PLACEMENT OF RIGHT FEMORAL CENTRAL LINE TIMES TWO  AND PLACEMENT OF SWAN GANZ CATHETER (Right) PLACEMENT OF TRIALYSIS SHORT TERM DIALYSIS CATHETER  Overall clinically stable and maintaining NSR w/out any further episodes of SVT on oral amiodarone BP stable off all drips Breathing comfortably w/ O2 sats 100% on 2 L/min and CXR clear ESRD on CVVHD - I/O's negative 1.8 liters yesterday and weight down 6 lbs still 10 lbs >  preop Acute on chronic combined systolic and diastolic CHF with expected post-op volume excess Expected post op acute blood loss anemiawith preop anemia of chronic disease assoc w/ ESRD   Continue midodrine - expect SBP 85-95  Stop CVVHD, d/c femoral lines and mobilize out of bed  Continue amiodarone  Increase Coumadin  [name], MD 09/06/2017 4:35 PM

## 2017-09-07 ENCOUNTER — Institutional Professional Consult (permissible substitution): Payer: BLUE CROSS/BLUE SHIELD | Admitting: Neurology

## 2017-09-07 LAB — CBC WITH DIFFERENTIAL/PLATELET
ABS IMMATURE GRANULOCYTES: 0 10*3/uL (ref 0.0–0.1)
Basophils Absolute: 0 10*3/uL (ref 0.0–0.1)
Basophils Relative: 0 %
Eosinophils Absolute: 0.4 10*3/uL (ref 0.0–0.7)
Eosinophils Relative: 5 %
HCT: 28.5 % — ABNORMAL LOW (ref 39.0–52.0)
HEMOGLOBIN: 8.4 g/dL — AB (ref 13.0–17.0)
IMMATURE GRANULOCYTES: 0 %
LYMPHS PCT: 12 %
Lymphs Abs: 0.9 10*3/uL (ref 0.7–4.0)
MCH: 24.9 pg — ABNORMAL LOW (ref 26.0–34.0)
MCHC: 29.5 g/dL — ABNORMAL LOW (ref 30.0–36.0)
MCV: 84.3 fL (ref 78.0–100.0)
MONO ABS: 0.8 10*3/uL (ref 0.1–1.0)
Monocytes Relative: 11 %
NEUTROS ABS: 5.4 10*3/uL (ref 1.7–7.7)
NEUTROS PCT: 72 %
PLATELETS: 171 10*3/uL (ref 150–400)
RBC: 3.38 MIL/uL — AB (ref 4.22–5.81)
RDW: 16 % — AB (ref 11.5–15.5)
WBC: 7.6 10*3/uL (ref 4.0–10.5)

## 2017-09-07 LAB — CALCIUM, IONIZED: Calcium, Ionized, Serum: 5.3 mg/dL (ref 4.5–5.6)

## 2017-09-07 LAB — RENAL FUNCTION PANEL
Albumin: 2.1 g/dL — ABNORMAL LOW (ref 3.5–5.0)
Anion gap: 12 (ref 5–15)
BUN: 27 mg/dL — ABNORMAL HIGH (ref 6–20)
CHLORIDE: 89 mmol/L — AB (ref 101–111)
CO2: 40 mmol/L — AB (ref 22–32)
CREATININE: 5.7 mg/dL — AB (ref 0.61–1.24)
Calcium: 9.4 mg/dL (ref 8.9–10.3)
GFR calc Af Amer: 12 mL/min — ABNORMAL LOW (ref >60)
GFR calc non Af Amer: 10 mL/min — ABNORMAL LOW (ref >60)
Glucose, Bld: 68 mg/dL (ref 65–99)
Phosphorus: 4.4 mg/dL (ref 2.5–4.6)
Potassium: 4 mmol/L (ref 3.5–5.1)
Sodium: 141 mmol/L (ref 135–145)

## 2017-09-07 LAB — PROTIME-INR
INR: 1.38
Prothrombin Time: 16.8 seconds — ABNORMAL HIGH (ref 11.4–15.2)

## 2017-09-07 MED ORDER — ALBUMIN HUMAN 25 % IV SOLN
INTRAVENOUS | Status: AC
  Administered 2017-09-07: 25 g via INTRAVENOUS
  Filled 2017-09-07: qty 100

## 2017-09-07 MED ORDER — ALBUMIN HUMAN 25 % IV SOLN
25.0000 g | Freq: Once | INTRAVENOUS | Status: AC
  Administered 2017-09-07: 25 g via INTRAVENOUS

## 2017-09-07 MED ORDER — HEPARIN SODIUM (PORCINE) 5000 UNIT/ML IJ SOLN
5000.0000 [IU] | Freq: Three times a day (TID) | INTRAMUSCULAR | Status: DC
  Administered 2017-09-07 – 2017-09-08 (×3): 5000 [IU] via SUBCUTANEOUS
  Filled 2017-09-07 (×3): qty 1

## 2017-09-07 MED ORDER — DARBEPOETIN ALFA 100 MCG/0.5ML IJ SOSY
PREFILLED_SYRINGE | INTRAMUSCULAR | Status: AC
  Administered 2017-09-07: 100 ug via INTRAVENOUS
  Filled 2017-09-07: qty 0.5

## 2017-09-07 MED ORDER — MOVING RIGHT ALONG BOOK
Freq: Once | Status: DC
  Filled 2017-09-07: qty 1

## 2017-09-07 MED ORDER — HEPARIN SODIUM (PORCINE) 1000 UNIT/ML DIALYSIS
20.0000 [IU]/kg | INTRAMUSCULAR | Status: DC | PRN
  Administered 2017-09-07 – 2017-09-09 (×2): 2900 [IU] via INTRAVENOUS_CENTRAL
  Filled 2017-09-07 (×2): qty 3

## 2017-09-07 NOTE — Consult Note (Signed)
   [facility] CM Inpatient Consult   09/07/2017  [name] [name] [birth year] [id number]  Patient screened for readmission in less than 30 days showing up in [facility] report. Currently, electronic medical record does not list a primary care provider.    Patient evaluated for [place] Management services.  Patient is not currently a beneficiary of the attributed [place] in the [employer].  His Primary Care Provider is not a [facility] primary care provider or is not [facility] affiliated.   This patient is Not eligible for [facility] Care Management Services.   Reason:  Not a beneficiary currently attributed to one of the [place].  Membership roster was used to verify non- eligible status.  For questions contact:   Natividad Brood, RN BSN [facility] Liaison  [phone] business mobile phone Toll free office [phone]

## 2017-09-07 NOTE — Progress Notes (Signed)
Patient noted to have multiple instances of hypotension (below Nephrology parameters) during HD treatment.  Non symptomatic.  UFG lowered and paused accordingly several times thus far.  tx with 3.25 hours remaining.  UF currently paused for confirmed BP of 79/57.  Will continue to monitor.

## 2017-09-07 NOTE — Progress Notes (Signed)
Arrived to patient room 2H-15 at 0950.  Reviewed treatment plan and this RN agrees.  Report received from bedside RN, Elmyra Ricks.  Consent obtained.  Patient A & O X 4. Lung sounds diminished and clear to ausculation in all fields. Generalized 1+ pitting edema. Cardiac: NSR to ST.  Prepped LLAVF with alcohol and cannulated with two 15 gauge needles.  Pulsation of blood noted.  Flushed access well with saline per protocol.  Connected and secured lines and initiated tx at 1105.  UF goal of 4500 mL and net fluid removal of 4000 mL.  Will continue to monitor.

## 2017-09-07 NOTE — Care Management Note (Signed)
Case Management Note [name] Gibbons RN,BSN [facility] 1-22 Case Manager  [phone]  Patient Details  Name: [name] [name] MRN: [id number] Date of Birth: Aug 29, 1965  Subjective/Objective:  Pt admitted s/p AVR and CABGx1, post op CVVHD (ESRD) to transition to IHD per renal on 5/23            Post op SVT- on amio    Action/Plan: PTA PT lived at home with spouse, Hx of ESRD- HD with [facility] in [place]- anticipate return home with spouse- CM to follow for transition of care needs  Expected Discharge Date:                  Expected Discharge Plan:     In-House Referral:     Discharge planning Services  CM Consult  Post Acute Care Choice:    Choice offered to:     DME Arranged:    DME Agency:     HH Arranged:    [place] Agency:     Status of Service:  In process, will continue to follow  If discussed at Long Length of Stay Meetings, dates discussed:    Discharge Disposition:   Additional Comments:  Dawayne Patricia, RN 09/07/2017, 10:51 AM

## 2017-09-07 NOTE — Progress Notes (Signed)
Patient ID: [name] [name], male   DOB: [birth year], 52 y.o.   MRN: [id number] P    Advanced Heart Failure Rounding Note  PCP-Cardiologist: Kate Sable, MD   Subjective:    Evening of 09/02/17 went into SVT at 160, started on amiodarone gtt.  He then went into atrial fibrillation in the 100s. This morning, he is in NSR on amiodarone.   Extubated 09/03/17. He is now off pressors and CVVH.  Walked in hall yesterday, feels good.   Objective:   Weight Range: (!) 321 lb 6.9 oz (145.8 kg) Body mass index is 43.59 kg/m.   Vital Signs:   Temp:  [97.7 F (36.5 C)-99.2 F (37.3 C)] 97.8 F (36.6 C) (05/23 0725) Pulse Rate:  [89-106] 95 (05/23 0600) Resp:  [5-25] 23 (05/23 0600) BP: (86-103)/(51-84) 94/61 (05/23 0600) SpO2:  [90 %-100 %] 100 % (05/23 0600) Weight:  [321 lb 6.9 oz (145.8 kg)] 321 lb 6.9 oz (145.8 kg) (05/23 0500) Last BM Date: 09/06/17  Weight change: Filed Weights   09/05/17 0500 09/06/17 0328 09/07/17 0500  Weight: (!) 324 lb 11.8 oz (147.3 kg) (!) 318 lb 2 oz (144.3 kg) (!) 321 lb 6.9 oz (145.8 kg)    Intake/Output:   Intake/Output Summary (Last 24 hours) at 09/07/2017 0749 Last data filed at 09/06/2017 2000 Gross per 24 hour  Intake 810 ml  Output 467 ml  Net 343 ml      Physical Exam    General: NAD Neck: Thick, no JVD, no thyromegaly or thyroid nodule.  Lungs: Clear to auscultation bilaterally with normal respiratory effort. CV: Nondisplaced PMI.  Heart regular S1/S2 with mechanical S2, no S3/S4, 1/6 SEM RUSB.  No peripheral edema.   Abdomen: Soft, nontender, no hepatosplenomegaly, no distention.  Skin: Intact without lesions or rashes.  Neurologic: Alert and oriented x 3.  Psych: Normal affect. Extremities: No clubbing or cyanosis.  HEENT: Normal.    Telemetry   NSR 90s-100s, personally reviewed.   Labs    CBC Recent Labs    09/06/17 0306  09/06/17 0321 09/07/17 0242  WBC 8.5  --   --  7.6  NEUTROABS 6.4  --   --  5.4  HGB 8.6*   <  > 10.5* 8.4*  HCT 29.0*   < > 31.0* 28.5*  MCV 83.3  --   --  84.3  PLT 155  --   --  171   < > = values in this interval not displayed.   Basic Metabolic Panel Recent Labs    09/05/17 0300  09/06/17 0306  09/06/17 0321 09/07/17 0242  NA 140   < > 141   < > 141 141  K 4.1   < > 3.7   < > 3.5 4.0  CL 92*   < > 90*   < > 89* 89*  CO2 34*   < > 39*  --   --  40*  GLUCOSE 147*   < > 113*   < > 196* 68  BUN 16   < > 17   < > 12 27*  CREATININE 4.31*   < > 4.11*   < > 2.50* 5.70*  CALCIUM 10.0   < > 10.5*  --   --  9.4  MG 1.8  --  2.0  --   --   --   PHOS 4.4   < > 3.8  --   --  4.4   < > = values in  this interval not displayed.   Liver Function Tests Recent Labs    09/05/17 0300  09/06/17 0306 09/07/17 0242  AST 16  --   --   --   ALT 10*  --   --   --   ALKPHOS 84  --   --   --   BILITOT 0.8  --   --   --   PROT 5.7*  --   --   --   ALBUMIN 2.3*   < > 2.1* 2.1*   < > = values in this interval not displayed.   No results for input(s): LIPASE, AMYLASE in the last 72 hours. Cardiac Enzymes No results for input(s): CKTOTAL, CKMB, CKMBINDEX, TROPONINI in the last 72 hours.  BNP: BNP (last 3 results) Recent Labs    07/28/17 1831  BNP 1,098.0*    ProBNP (last 3 results) No results for input(s): PROBNP in the last 8760 hours.   D-Dimer No results for input(s): DDIMER in the last 72 hours. Hemoglobin A1C No results for input(s): HGBA1C in the last 72 hours. Fasting Lipid Panel No results for input(s): CHOL, HDL, LDLCALC, TRIG, CHOLHDL, LDLDIRECT in the last 72 hours. Thyroid Function Tests No results for input(s): TSH, T4TOTAL, T3FREE, THYROIDAB in the last 72 hours.  Invalid input(s): FREET3  Other results:   Imaging    No results found.   Medications:     Scheduled Medications: . amiodarone  200 mg Oral BID  . aspirin EC  81 mg Oral Daily  . atorvastatin  40 mg Oral q1800  . bisacodyl  10 mg Oral Daily   Or  . bisacodyl  10 mg Rectal Daily  .  Chlorhexidine Gluconate Cloth  6 each Topical Daily  . darbepoetin (ARANESP) injection - DIALYSIS  100 mcg Intravenous Q Thu-HD  . docusate sodium  200 mg Oral Daily  . enoxaparin (LOVENOX) injection  30 mg Subcutaneous Daily  . feeding supplement (NEPRO CARB STEADY)  237 mL Oral TID BM  . lanthanum  2,000 mg Oral TID WC  . midodrine  10 mg Oral TID WC  . multivitamin  1 tablet Oral QHS  . pantoprazole  40 mg Oral Daily  . sodium chloride flush  10-40 mL Intracatheter Q12H  . sodium chloride flush  3 mL Intravenous Q12H  . warfarin  5 mg Oral q1800  . Warfarin - Physician Dosing Inpatient   Does not apply q1800    Infusions: . sodium chloride Stopped (09/04/17 0700)  . ferric gluconate (FERRLECIT/NULECIT) IV Stopped (09/06/17 1454)    PRN Medications: morphine injection, ondansetron (ZOFRAN) IV, oxyCODONE, sodium chloride flush, sodium chloride flush, traMADol   Assessment/Plan   1. 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 No active vegetation seen.  Blood cultures werenegativeduring 4/19 hospitalization. Severe AI could certainly potentiate CHF. This was not see on 2012 echo. The LV is not significantly dilated, so not sure AI is the only explanation for his cardiomyopathy. s/p mechanical AVR 09/01/17.  - Continue ASA, warfarin started.  INR 1.38 today.   - Will eventually need post op Echo.  2. Acute on chronic systolic CHF/cardiogenic shock: Echo 4/19with EF 20-25%. As LV is not markedly dilated, I am not sure that aortic insufficiency can be invoked as the only cause for cardiomyopathy. He has hadchronic sinus tachycardia x years but HR has been in 100s-110s, do not think  this would have been likely to cause a tachy-mediated CMP. He has serial 80% stenoses in the LCx, but do not think this can explain all of the cardiomyopathy either. It is  possible thatcardiomyopathyis multifactorial. Post-op, he has been hypotensive.  However, at baseline his BP is marginal and he requires midodrine with HD. He has had reasonable cardiac output by [name] (now removed).  He is now off pressors and off CVVH. - Continue midodrine 10 mg tid for now. BP low/marginal at baseline (requires midodrine for HD) - iHD today.  - Will leave off Corlanor for now with episode of atrial fibrillation this admission.   3. ESRD:Starting back on iHD today.  4. JFH:LKTGYBW 5/18 evening and again on 5/22.  Now on po amiodarone. SVT tends to be in the 140s-150s.   - Continue amiodarone for now.  - Would eventually involve EP (as outpatient) and consider ablation as this is recurrent and rapid.  5. CAD: S/p SVG-OM.  - Continue statin and ASA.  6. Atrial fibrillation:  He is in NSR this am on amiodarone.  - Now on po amiodarone.  - Coumadin started 09/03/17. TCTS dosing.   Length of Stay: 6  Loralie Champagne, MD  09/07/2017, 7:49 AM  Advanced Heart Failure Team Pager [phone] (M-F; 7a - 4p)  Please contact [facility] for night-coverage after hours (4p -7a ) and weekends on [email or web address]

## 2017-09-07 NOTE — Progress Notes (Signed)
[facility] Rounding Note  Background:  52 year old [name] HTN, CM w/low EF (20-25), CAD, ESRD [facility] (on HD for 17 years!!), secondary HPT, anemia, chronic hypotension, central vein occlusions. S/P single vessel CABG/AoVR 1/61/09 complicated by shock requiring pressors. CRRT via L fem cath 09/02/17 - 5/22. Transitioned to IHD 5/23  Usual HD orders [facility] 4 hours and 45 min TTS  Left AVF  450/800- 2K/2.5 calc  EDW 140.5 2000 per hour of heparin Venofer 50 qTues Hect 12 mcg q tx/parsabiv 2.5 q tx  Assessment/Recommendations   1. ESRD - usual TTS [facility]. CRRT post CABG/valve replacement d/t hemodynamic instability. Fem vas cath placed 5/18. CRRT 5/18-5/22. For IHD today as separate in ICU   2. Vascular access - L AVF. Apparently all central vv occluded up top 3. Shock - off IV pressors, on po midodrine. Hypotension chronic 4. S/p mechanical AoVR and single vessel (SVG to OM) CABG 5/17 . On ASA and warfarin  5. SVT/AF - NSR on amio at present but had rec SVT again yesterday. Per cards  1. Anemia - Hb <10 on last CBC, higher (as usual) on istat. No ESA as outpt. TSat low at 10% so 8 doses Ferrlecit ordered as load.  2. Adding aranesp as last Hb on CBC was down to 8.6, start with 5/23 HD today 6. Secondary HPT - normally on parsabiv (not available here) and hectorol. Off parsibiv has developed hypercalcemia and since not available here and Ca high will not start hectorol back now as would make ^ Ca worse.. Takes fosrenol 4 ac, 2-3 snacks at home. Resumed   Jamal Maes, MD [place] Pager 09/07/2017, 9:02 AM   Subjective:    Off CRRT for 24 hours Walked some, up in the chair, no O2 requirement, says feels good For HD today  Objective Vital signs in last 24 hours: Vitals:   09/07/17 0400 09/07/17 0500 09/07/17 0600 09/07/17 0725  BP: (!) 89/51  94/61   Pulse: 93  95   Resp: (!) 24  (!) 23   Temp:    97.8 F (36.6 C)  TempSrc:     Oral  SpO2: 96%  100%   Weight:  (!) 145.8 kg (321 lb 6.9 oz)    Height:       Weight change: 1.5 kg (3 lb 4.9 oz)  Intake/Output Summary (Last 24 hours) at 09/07/2017 0902 Last data filed at 09/06/2017 2000 Gross per 24 hour  Intake 420 ml  Output 163 ml  Net 257 ml   Physical Exam: Very pleasant [name] Room air S1S2 No S3 NSR on tele Ant lungs clear Chest tubes out Abd obese not tender SCD's in place, trace if any LE edema L FA AVF + bruit (but soft). Arm sl edematous.  L fem vas cath out   Recent Labs  Lab 09/05/17 1628  09/06/17 0306 09/06/17 0318 09/06/17 0321 09/07/17 0242  NA 142   < > 141 139 141 141  K 3.8   < > 3.7 3.5 3.5 4.0  CL 93*   < > 90* 86* 89* 89*  CO2 37*  --  39*  --   --  40*  GLUCOSE 121*   < > 113* 107* 196* 68  BUN 16   < > 17 18 12  27*  CREATININE 4.08*   < > 4.11* 3.80* 2.50* 5.70*  CALCIUM 10.4*  --  10.5*  --   --  9.4  PHOS 4.1  --  3.8  --   --  4.4   < > = values in this interval not displayed.    Recent Labs  Lab 09/03/17 0236  09/05/17 0300 09/05/17 1628 09/06/17 0306 09/07/17 0242  AST 32  --  16  --   --   --   ALT 11*  --  10*  --   --   --   ALKPHOS 71  --  84  --   --   --   BILITOT 0.7  --  0.8  --   --   --   PROT 6.0*  --  5.7*  --   --   --   ALBUMIN 2.5*  2.5*   < > 2.3* 2.1* 2.1* 2.1*   < > = values in this interval not displayed.    Recent Labs  Lab 09/04/17 0535  09/04/17 0913  09/05/17 1315  09/06/17 0306 09/06/17 0318 09/06/17 0321 09/07/17 0242  WBC 13.7*  --  14.0*  --  10.1  --  8.5  --   --  7.6  NEUTROABS  --   --   --   --   --   --  6.4  --   --  5.4  HGB 9.7*   < > 9.7*   < > 8.6*   < > 8.6* 9.9* 10.5* 8.4*  HCT 31.8*   < > 32.0*   < > 28.9*   < > 29.0* 29.0* 31.0* 28.5*  MCV 81.5  --  82.3  --  83.0  --  83.3  --   --  84.3  PLT 214  --  207  --  176  --  155  --   --  171   < > = values in this interval not displayed.    Recent Labs  Lab 09/04/17 0616 09/04/17 0706  09/04/17 0757 09/04/17 0917 09/06/17 1229  GLUCAP 139* 136* 129* 142* 71   Iron/TIBC/Ferritin/ %Sat    Component Value Date/Time   IRON 15 (L) 09/04/2017 0913   TIBC 151 (L) 09/04/2017 0913   IRONPCTSAT 10 (L) 09/04/2017 0913   Studies/Results: Dg Chest Port 1 View  Result Date: 09/06/2017 CLINICAL DATA:  Atelectasis, recent aortic valve replacement and CABG, chest soreness. EXAM: PORTABLE CHEST 1 VIEW COMPARISON:  09/05/2017 and preoperative chest x-ray 08/21/2017. FINDINGS: Trachea is midline. Cardiomediastinal silhouette is enlarged, stable. Seven intact sternotomy wires. Lungs are low in volume with left perihilar and left basilar subsegmental atelectasis. Airspace opacification in the right upper lobe, as on 09/05/2017 but new from 08/21/2017. No definite pleural fluid or pneumothorax. Left thyroidectomy clips. IMPRESSION: 1. Low lung volumes with left perihilar and left basilar atelectasis. 2. Mild right upper lobe airspace opacification, possibly due to atelectasis. Pneumonia is not excluded. Electronically Signed   By: Lorin Picket M.D.   On: 09/06/2017 09:48   Medications: Infusions: . sodium chloride Stopped (09/04/17 0700)  . ferric gluconate (FERRLECIT/NULECIT) IV Stopped (09/06/17 1454)   Scheduled Medications: . amiodarone  200 mg Oral BID  . aspirin EC  81 mg Oral Daily  . atorvastatin  40 mg Oral q1800  . bisacodyl  10 mg Oral Daily   Or  . bisacodyl  10 mg Rectal Daily  . Chlorhexidine Gluconate Cloth  6 each Topical Daily  . darbepoetin (ARANESP) injection - DIALYSIS  100 mcg Intravenous Q Thu-HD  . docusate sodium  200 mg Oral Daily  . feeding supplement (NEPRO CARB STEADY)  237 mL Oral TID BM  . heparin  5,000 Units Subcutaneous Q8H  . lanthanum  2,000 mg Oral TID WC  . midodrine  10 mg Oral TID WC  . moving right along book   Does not apply Once  . multivitamin  1 tablet Oral QHS  . pantoprazole  40 mg Oral Daily  . sodium chloride flush  10-40 mL  Intracatheter Q12H  . sodium chloride flush  3 mL Intravenous Q12H  . warfarin  5 mg Oral q1800  . Warfarin - Physician Dosing Inpatient   Does not apply [phone]

## 2017-09-07 NOTE — Progress Notes (Signed)
Dialysis treatment completed.  2000 mL ultrafiltrated and net fluid removal 1500 mL.    Patient status unchanged. Lung sounds diminished to ausculation in all fields. Generalized 1+ pitting edema. Cardiac: NSR.  Disconnected lines and removed needles.  Pressure held for 10 minutes and band aid/gauze dressing applied.  Report given to bedside RN, Lattie Haw.

## 2017-09-07 NOTE — Progress Notes (Signed)
Patient ID: TRES GRZYWACZ, male   DOB: 12-Sep-1965, 52 y.o.   MRN: [id number] EVENING ROUNDS NOTE :     [address]       [place] [address]             [phone]                 6 Days Post-Op Procedure(s) (LRB): AORTIC VALVE REPLACEMENT (AVR) (N/A) CORONARY ARTERY BYPASS GRAFTING (CABG) X 1, USING RIGHT GREATER SAPHENOUS VEIN HARVESTED ENDOSCOPICALLY (N/A) TRANSESOPHAGEAL ECHOCARDIOGRAM (TEE) (N/A) FLOROSCOPY GUIDED PLACEMENT OF RIGHT FEMORAL CENTRAL LINE TIMES TWO  AND PLACEMENT OF SWAN GANZ CATHETER (Right) PLACEMENT OF TRIALYSIS SHORT TERM DIALYSIS CATHETER  Total Length of Stay:  LOS: 6 days  BP (!) 84/62   Pulse 100   Temp 98.2 F (36.8 C)   Resp (!) 30   Ht 6' (1.829 m)   Wt (!) 318 lb 2 oz (144.3 kg)   SpO2 93%   BMI 43.15 kg/m   .Intake/Output      05/22 0701 - 05/23 0700 05/23 0701 - 05/24 0700   P.O. 360 480   I.V. (mL/kg) 450 (3.1)    IV Piggyback     Total Intake(mL/kg) 810 (5.6) 480 (3.3)   Urine (mL/kg/hr) 0 (0)    Other 467 1500   Stool 0    Total Output 467 1500   Net +343 -1020        Urine Occurrence 3 x    Stool Occurrence 2 x      . sodium chloride Stopped (09/04/17 0700)  . ferric gluconate (FERRLECIT/NULECIT) IV Stopped (09/07/17 1258)     Lab Results  Component Value Date   WBC 7.6 09/07/2017   HGB 8.4 (L) 09/07/2017   HCT 28.5 (L) 09/07/2017   PLT 171 09/07/2017   GLUCOSE 68 09/07/2017   CHOL 169 05/16/2013   TRIG 97 05/16/2013   HDL 35 (L) 05/16/2013   LDLCALC 115 (H) 05/16/2013   ALT 10 (L) 09/05/2017   AST 16 09/05/2017   NA 141 09/07/2017   K 4.0 09/07/2017   CL 89 (L) 09/07/2017   CREATININE 5.70 (H) 09/07/2017   BUN 27 (H) 09/07/2017   CO2 40 (H) 09/07/2017   TSH 1.670 09/03/2017   INR 1.38 09/07/2017   HGBA1C 5.0 08/21/2017   inr still low coumadin 5.0 mg today Tolerated 4 hours of dialysis today  Grace Isaac MD  Beeper [phone] Office [phone] 09/07/2017 6:06 PM

## 2017-09-07 NOTE — Progress Notes (Signed)
[address]       [place] [address]             [phone]        CARDIOTHORACIC SURGERY PROGRESS NOTE   R6 Days Post-Op Procedure(s) (LRB): AORTIC VALVE REPLACEMENT (AVR) (N/A) CORONARY ARTERY BYPASS GRAFTING (CABG) X 1, USING RIGHT GREATER SAPHENOUS VEIN HARVESTED ENDOSCOPICALLY (N/A) TRANSESOPHAGEAL ECHOCARDIOGRAM (TEE) (N/A) FLOROSCOPY GUIDED PLACEMENT OF RIGHT FEMORAL CENTRAL LINE TIMES TWO  AND PLACEMENT OF SWAN GANZ CATHETER (Right) PLACEMENT OF TRIALYSIS SHORT TERM DIALYSIS CATHETER  Subjective: Looks great.  Sitting up in chair eating breakfast.  Minimal pain.  No SOB.  Ambulated some.  Objective: Vital signs: BP Readings from Last 1 Encounters:  09/07/17 94/61   Pulse Readings from Last 1 Encounters:  09/07/17 95   Resp Readings from Last 1 Encounters:  09/07/17 (!) 23   Temp Readings from Last 1 Encounters:  09/07/17 97.8 F (36.6 C) (Oral)    Hemodynamics:    Physical Exam:  Rhythm:   sinus  Breath sounds: clear  Heart sounds:  RRR w/ mechanical sounds  Incisions:  Clean and dry  Abdomen:  Soft, non-distended, non-tender  Extremities:  Warm, well-perfused    Intake/Output from previous day: 05/22 0701 - 05/23 0700 In: 810 [P.O.:360; I.V.:450] Out: 467  Intake/Output this shift: No intake/output data recorded.  Lab Results:  CBC: Recent Labs    09/06/17 0306  09/06/17 0321 09/07/17 0242  WBC 8.5  --   --  7.6  HGB 8.6*   < > 10.5* 8.4*  HCT 29.0*   < > 31.0* 28.5*  PLT 155  --   --  171   < > = values in this interval not displayed.    BMET:  Recent Labs    09/06/17 0306  09/06/17 0321 09/07/17 0242  NA 141   < > 141 141  K 3.7   < > 3.5 4.0  CL 90*   < > 89* 89*  CO2 39*  --   --  40*  GLUCOSE 113*   < > 196* 68  BUN 17   < > 12 27*  CREATININE 4.11*   < > 2.50* 5.70*  CALCIUM 10.5*  --   --  9.4   < > = values in this interval not displayed.     PT/INR:   Recent Labs    09/07/17 0242    LABPROT 16.8*  INR 1.38    CBG (last 3)  Recent Labs    09/04/17 0917 09/06/17 1229  GLUCAP 142* 71    ABG    Component Value Date/Time   PHART 7.409 09/03/2017 1110   PCO2ART 36.4 09/03/2017 1110   PO2ART 67.0 (L) 09/03/2017 1110   HCO3 23.5 09/03/2017 1110   TCO2 31 09/06/2017 0321   ACIDBASEDEF 1.0 09/03/2017 1110   O2SAT 43.2 09/04/2017 0848    CXR: n/a  Assessment/Plan: S/P Procedure(s) (LRB): AORTIC VALVE REPLACEMENT (AVR) (N/A) CORONARY ARTERY BYPASS GRAFTING (CABG) X 1, USING RIGHT GREATER SAPHENOUS VEIN HARVESTED ENDOSCOPICALLY (N/A) TRANSESOPHAGEAL ECHOCARDIOGRAM (TEE) (N/A) FLOROSCOPY GUIDED PLACEMENT OF RIGHT FEMORAL CENTRAL LINE TIMES TWO  AND PLACEMENT OF SWAN GANZ CATHETER (Right) PLACEMENT OF TRIALYSIS SHORT TERM DIALYSIS CATHETER  Overall doing well and maintaining NSR except for one episode SVT yesterday morning BP stable (but low) off all drips Breathing comfortably w/ O2 sats99%on room air ESRD on CVVHD - I/O's balanced yesterday andweight up 3 lbsstill 13lbs >preop  Acute on chronic combined systolic and diastolic CHF with expected post-op volume excess Expected post op acute blood loss anemiawith preop anemia of chronic disease assoc w/ ESRD   Continue midodrine - expect SBP 85-95  Continue oral amiodarone  Continue Coumadin  Low dose heparin for DVT prophylaxis until INR increases  Trial conventional HD per Nephrology at bedside in ICU  Mobilize as much as possible    [name], MD 09/07/2017 8:11 AM

## 2017-09-07 NOTE — Procedures (Signed)
Pt undergoing 1st HD since his CABG/valve Promptly dropped BP despite midodrine and UF mostly off 2/2 BP as low as 70's Has intermittently  sustained HR's in the 120's Will continue his treatment for clearance, not likely to get much in the way of volume off 4K bath L RC AVF BFR 450  Jamal Maes, MD [place] Pager 09/07/2017, 2:45 PM

## 2017-09-07 NOTE — Progress Notes (Signed)
Nephrology notified of BP below parameters at start of treatment.  Telephone orders received.  Will continue to monitor.

## 2017-09-07 NOTE — Progress Notes (Signed)
UFG lowered d/t new HR sustaining in 120's.  Patient non symptomatic.  Will continue UF as tolerated.  Will continue to monitor.

## 2017-09-08 LAB — CBC WITH DIFFERENTIAL/PLATELET
Abs Immature Granulocytes: 0 10*3/uL (ref 0.0–0.1)
Basophils Absolute: 0 10*3/uL (ref 0.0–0.1)
Basophils Relative: 1 %
EOS PCT: 5 %
Eosinophils Absolute: 0.4 10*3/uL (ref 0.0–0.7)
HEMATOCRIT: 28.6 % — AB (ref 39.0–52.0)
HEMOGLOBIN: 8.2 g/dL — AB (ref 13.0–17.0)
Immature Granulocytes: 1 %
Lymphocytes Relative: 12 %
Lymphs Abs: 1 10*3/uL (ref 0.7–4.0)
MCH: 24.9 pg — AB (ref 26.0–34.0)
MCHC: 28.7 g/dL — AB (ref 30.0–36.0)
MCV: 86.9 fL (ref 78.0–100.0)
MONO ABS: 1 10*3/uL (ref 0.1–1.0)
Monocytes Relative: 12 %
NEUTROS ABS: 5.6 10*3/uL (ref 1.7–7.7)
Neutrophils Relative %: 69 %
Platelets: 212 10*3/uL (ref 150–400)
RBC: 3.29 MIL/uL — ABNORMAL LOW (ref 4.22–5.81)
RDW: 17 % — AB (ref 11.5–15.5)
WBC: 8 10*3/uL (ref 4.0–10.5)

## 2017-09-08 LAB — PROTIME-INR
INR: 1.66
PROTHROMBIN TIME: 19.5 s — AB (ref 11.4–15.2)

## 2017-09-08 LAB — RENAL FUNCTION PANEL
ALBUMIN: 2.4 g/dL — AB (ref 3.5–5.0)
Anion gap: 12 (ref 5–15)
BUN: 17 mg/dL (ref 6–20)
CALCIUM: 8.9 mg/dL (ref 8.9–10.3)
CO2: 35 mmol/L — ABNORMAL HIGH (ref 22–32)
CREATININE: 4.64 mg/dL — AB (ref 0.61–1.24)
Chloride: 93 mmol/L — ABNORMAL LOW (ref 101–111)
GFR, EST AFRICAN AMERICAN: 15 mL/min — AB (ref >60)
GFR, EST NON AFRICAN AMERICAN: 13 mL/min — AB (ref >60)
Glucose, Bld: 75 mg/dL (ref 65–99)
PHOSPHORUS: 3.7 mg/dL (ref 2.5–4.6)
Potassium: 3.7 mmol/L (ref 3.5–5.1)
SODIUM: 140 mmol/L (ref 135–145)

## 2017-09-08 MED ORDER — MIDODRINE HCL 5 MG PO TABS: 15.0000 mg | ORAL_TABLET | ORAL | Status: DC

## 2017-09-08 MED ORDER — MIDODRINE HCL 5 MG PO TABS
15.0000 mg | ORAL_TABLET | Freq: Three times a day (TID) | ORAL | Status: DC
  Administered 2017-09-08 – 2017-09-10 (×7): 15 mg via ORAL
  Filled 2017-09-08 (×8): qty 3

## 2017-09-08 NOTE — Discharge Instructions (Signed)
Bleeding Precautions When on Anticoagulant Therapy WHAT IS ANTICOAGULANT THERAPY? Anticoagulant therapy is taking medicine to prevent or reduce blood clots. It is also called blood thinner therapy. Blood clots that form in your blood vessels can be dangerous. They can break loose and travel to your heart, lungs, or brain. This increases your risk of a heart attack or stroke. Anticoagulant therapy causes blood to clot more slowly. You may need anticoagulant therapy if you have:  A medical condition that increases the likelihood that blood clots will form.  A heart defect or a problem with heart rhythm. It is also a common treatment after heart surgery, such as valve replacement. WHAT ARE COMMON TYPES OF ANTICOAGULANT THERAPY? Anticoagulant medicine can be injected or taken by mouth.If you need anticoagulant therapy quickly at the hospital, the medicine may be injected under your skin or given through an IV tube. Heparin is a common example of an anticoagulant that you may get at the hospital. Most anticoagulant therapy is in the form of pills that you take at home every day. These may include:  Aspirin. This common blood thinner works by preventing blood cells (platelets) from sticking together to form a clot. Aspirin is not as strong as anticoagulants that slow down the time that it takes for your body to form a clot.  Clopidogrel. This is a newer type of drug that affects platelets. It is stronger than aspirin.  Warfarin. This is the most common anticoagulant. It changes the way your body uses vitamin K, a vitamin that helps your blood to clot. The risk of bleeding is higher with warfarin than with aspirin. You will need frequent blood tests to make sure you are taking the safest amount.  New anticoagulants. Several new drugs have been approved. They are all taken by mouth. Studies show that these drugs work as well as warfarin. They do not require blood testing. They may cause less bleeding  risk than warfarin. WHAT DO I NEED TO REMEMBER WHEN TAKING ANTICOAGULANT THERAPY? Anticoagulant therapy decreases your risk of forming a blood clot, but it increases your risk of bleeding. Work closely with your health care provider to make sure you are taking your medicine safely. These tips can help:  Learn ways to reduce your risk of bleeding.  If you are taking warfarin: ? Have blood tests as ordered by your health care provider. ? Do not make any sudden changes to your diet. Vitamin K in your diet can make warfarin less effective. ? Do not get pregnant. This medicine may cause birth defects.  Take your medicine at the same time every day. If you forget to take your medicine, take it as soon as you remember. If you miss a whole day, do not double your dose of medicine. Take your normal dose and call your health care provider to check in.  Do not stop taking your medicine on your own.  Tell your health care provider before you start taking any new medicine, vitamin, or herbal product. Some of these could interfere with your therapy.  Tell all of your health care providers that you are on anticoagulant therapy.  Do not have surgery, medical procedures, or dental work until you tell your health care provider that you are on anticoagulant therapy. WHAT CAN AFFECT HOW ANTICOAGULANTS WORK? Certain foods, vitamins, medicines, supplements, and herbal medicines change the way that anticoagulant therapy works. They may increase or decrease the effects of your anticoagulant therapy. Either result can be dangerous for you.  Many over-the-counter medicines for pain, colds, or stomach problems interfere with anticoagulant therapy. Take these only as told by your health care provider.  Do not drink alcohol. It can interfere with your medicine and increase your risk of an injury that causes bleeding.  If you are taking warfarin, do not begin eating more foods that contain vitamin K. These include  leafy green vegetables. Ask your health care provider if you should avoid any foods. WHAT ARE SOME WAYS TO PREVENT BLEEDING? You can prevent bleeding by taking certain precautions:  Be extra careful when you use knives, scissors, or other sharp objects.  Use an electric razor instead of a blade.  Do not use toothpicks.  Use a soft toothbrush.  Wear shoes that have nonskid soles.  Use bath mats and handrails in your bathroom.  Wear gloves while you do yard work.  Wear a helmet when you ride a bike.  Wear your seat belt.  Prevent falls by removing loose rugs and extension cords from areas where you walk.  Do not play contact sports or participate in other activities that have a high risk of injury. [place] PROVIDER? Call your health care provider if:  You miss a dose of medicine: ? And you are not sure what to do. ? For more than one day.  You have: ? Menstrual bleeding that is heavier than normal. ? Blood in your urine. ? A bloody nose or bleeding gums. ? Easy bruising. ? Blood in your stool (feces) or have black and tarry stool. ? Side effects from your medicine.  You feel weak or dizzy.  You become pregnant. Seek immediate medical care if:  You have bleeding that will not stop.  You have sudden and severe headache or belly pain.  You vomit or you cough up bright red blood.  You have a severe blow to your head. WHAT ARE SOME QUESTIONS TO ASK MY HEALTH CARE PROVIDER?  What is the best anticoagulant therapy for my condition?  What side effects should I watch for?  When should I take my medicine? What should I do if I forget to take it?  Will I need to have regular blood tests?  Do I need to change my diet? Are there foods or drinks that I should avoid?  What activities are safe for me?  What should I do if I want to get pregnant? This information is not intended to replace advice given to you by your health care provider.  Make sure you discuss any questions you have with your health care provider. Document Released: 03/16/2015 Document Reviewed: 03/16/2015 Elsevier Interactive Patient Education  2017 [place]. Endoscopic Saphenous Vein Harvesting, Care After Refer to this sheet in the next few weeks. These instructions provide you with information about caring for yourself after your procedure. Your health care provider may also give you more specific instructions. Your treatment has been planned according to current medical practices, but problems sometimes occur. Call your health care provider if you have any problems or questions after your procedure. What can I expect after the procedure? After the procedure, it is common to have:  Pain.  Bruising.  Swelling.  Numbness.  Follow these instructions at home: Medicine  Take over-the-counter and prescription medicines only as told by your health care provider.  Do not drive or operate heavy machinery while taking prescription pain medicine. Incision care   Follow instructions from your health care provider about how to take  care of the cut made during surgery (incision). Make sure you: ? Wash your hands with soap and water before you change your bandage (dressing). If soap and water are not available, use hand sanitizer. ? Change your dressing as told by your health care provider. ? Leave stitches (sutures), skin glue, or adhesive strips in place. These skin closures may need to be in place for 2 weeks or longer. If adhesive strip edges start to loosen and curl up, you may trim the loose edges. Do not remove adhesive strips completely unless your health care provider tells you to do that.  Check your incision area every day for signs of infection. Check for: ? More redness, swelling, or pain. ? More fluid or blood. ? Warmth. ? Pus or a bad smell. General instructions  Raise (elevate) your legs above the level of your heart while you are  sitting or lying down.  Do any exercises your health care providers have given you. These may include deep breathing, coughing, and walking exercises.  Do not shower, take baths, swim, or use a hot tub unless told by your health care provider.  Wear your elastic stocking if told by your health care provider.  Keep all follow-up visits as told by your health care provider. This is important. Contact a health care provider if:  Medicine does not help your pain.  Your pain gets worse.  You have new leg bruises or your leg bruises get bigger.  You have a fever.  Your leg feels numb.  You have more redness, swelling, or pain around your incision.  You have more fluid or blood coming from your incision.  Your incision feels warm to the touch.  You have pus or a bad smell coming from your incision. Get help right away if:  Your pain is severe.  You develop pain, tenderness, warmth, redness, or swelling in any part of your leg.  You have chest pain.  You have trouble breathing. This information is not intended to replace advice given to you by your health care provider. Make sure you discuss any questions you have with your health care provider. Document Released: 12/15/2010 Document Revised: 09/10/2015 Document Reviewed: 02/16/2015 Elsevier Interactive Patient Education  2018 [place]. Coronary Artery Bypass Grafting, Care After These instructions give you information on caring for yourself after your procedure. Your doctor may also give you more specific instructions. Call your doctor if you have any problems or questions after your procedure. Follow these instructions at home:  Only take medicine as told by your doctor. Take medicines exactly as told. Do not stop taking medicines or start any new medicines without talking to your doctor first.  Take your pulse as told by your doctor.  Do deep breathing as told by your doctor. Use your breathing device (incentive  spirometer), if given, to practice deep breathing several times a day. Support your chest with a pillow or your arms when you take deep breaths or cough.  Keep the area clean, dry, and protected where the surgery cuts (incisions) were made. Remove bandages (dressings) only as told by your doctor. If strips were applied to surgical area, do not take them off. They fall off on their own.  Check the surgery area daily for puffiness (swelling), redness, or leaking fluid.  If surgery cuts were made in your legs: ? Avoid crossing your legs. ? Avoid sitting for long periods of time. Change positions every 30 minutes. ? Raise your legs when you are  sitting. Place them on pillows.  Wear stockings that help keep blood clots from forming in your legs (compression stockings).  Only take sponge baths until your doctor says it is okay to take showers. Pat the surgery area dry. Do not rub the surgery area with a washcloth or towel. Do not bathe, swim, or use a hot tub until your doctor says it is okay.  Eat foods that are high in fiber. These include raw fruits and vegetables, whole grains, beans, and nuts. Choose lean meats. Avoid canned, processed, and fried foods.  Drink enough fluids to keep your pee (urine) clear or pale yellow.  Weigh yourself every day.  Rest and limit activity as told by your doctor. You may be told to: ? Stop any activity if you have chest pain, shortness of breath, changes in heartbeat, or dizziness. Get help right away if this happens. ? Move around often for short amounts of time or take short walks as told by your doctor. Gradually become more active. You may need help to strengthen your muscles and build endurance. ? Avoid lifting, pushing, or pulling anything heavier than 10 pounds (4.5 kg) for at least 6 weeks after surgery.  Do not drive until your doctor says it is okay.  Ask your doctor when you can go back to work.  Ask your doctor when you can begin sexual  activity again.  Follow up with your doctor as told. Contact a doctor if:  You have puffiness, redness, more pain, or fluid draining from the incision site.  You have a fever.  You have puffiness in your ankles or legs.  You have pain in your legs.  You gain 2 or more pounds (0.9 kg) a day.  You feel sick to your stomach (nauseous) or throw up (vomit).  You have watery poop (diarrhea). Get help right away if:  You have chest pain that goes to your jaw or arms.  You have shortness of breath.  You have a fast or irregular heartbeat.  You notice a "clicking" in your breastbone when you move.  You have numbness or weakness in your arms or legs.  You feel dizzy or light-headed. This information is not intended to replace advice given to you by your health care provider. Make sure you discuss any questions you have with your health care provider. Document Released: 04/09/2013 Document Revised: 09/10/2015 Document Reviewed: 09/11/2012 Elsevier Interactive Patient Education  2017 [place]. Aortic Valve Replacement, Care After Refer to this sheet in the next few weeks. These instructions provide you with information about caring for yourself after your procedure. Your health care provider may also give you more specific instructions. Your treatment has been planned according to current medical practices, but problems sometimes occur. Call your health care provider if you have any problems or questions after your procedure. What can I expect after the procedure? After the procedure, it is common to have:  Pain around your incision area.  A small amount of blood or clear fluid coming from your incision.  Follow these instructions at home: Eating and drinking   Follow instructions from your health care provider about eating or drinking restrictions. ? Limit alcohol intake to no more than 1 drink per day for nonpregnant women and 2 drinks per day for men. One drink equals 12  oz of beer, 5 oz of wine, or 1 oz of hard liquor. ? Limit how much caffeine you drink. Caffeine can affect your heart's rate and rhythm.  Drink enough fluid to keep your urine clear or pale yellow.  Eat a heart-healthy diet. This should include plenty of fresh fruits and vegetables. If you eat meat, it should be lean cuts. Avoid foods that are: ? High in salt, saturated fat, or sugar. ? Canned or highly processed. ? Fried. Activity  Return to your normal activities as told by your health care provider. Ask your health care provider what activities are safe for you.  Exercise regularly once you have recovered, as told by your health care provider.  Avoid sitting for more than 2 hours at a time without moving. Get up and move around at least once every 1-2 hours. This helps to prevent blood clots in the legs.  Do not lift anything that is heavier than 10 lb (4.5 kg) until your health care provider approves.  Avoid pushing or pulling things with your arms until your health care provider approves. This includes pulling on handrails to help you climb stairs. Incision care   Follow instructions from your health care provider about how to take care of your incision. Make sure you: ? Wash your hands with soap and water before you change your bandage (dressing). If soap and water are not available, use hand sanitizer. ? Change your dressing as told by your health care provider. ? Leave stitches (sutures), skin glue, or adhesive strips in place. These skin closures may need to stay in place for 2 weeks or longer. If adhesive strip edges start to loosen and curl up, you may trim the loose edges. Do not remove adhesive strips completely unless your health care provider tells you to do that.  Check your incision area every day for signs of infection. Check for: ? More redness, swelling, or pain. ? More fluid or blood. ? Warmth. ? Pus or a bad smell. Medicines  Take over-the-counter and  prescription medicines only as told by your health care provider.  If you were prescribed an antibiotic medicine, take it as told by your health care provider. Do not stop taking the antibiotic even if you start to feel better. Travel  Avoid airplane travel for as long as told by your health care provider.  When you travel, bring a list of your medicines and a record of your medical history with you. Carry your medicines with you. Driving  Ask your health care provider when it is safe for you to drive. Do not drive until your health care provider approves.  Do not drive or operate heavy machinery while taking prescription pain medicine. Lifestyle   Do not use any tobacco products, such as cigarettes, chewing tobacco, or e-cigarettes. If you need help quitting, ask your health care provider.  Resume sexual activity as told by your health care provider. Do not use medicines for erectile dysfunction unless your health care provider approves, if this applies.  Work with your health care provider to keep your blood pressure and cholesterol under control, and to manage any other heart conditions that you have.  Maintain a healthy weight. General instructions  Do not take baths, swim, or use a hot tub until your health care provider approves.  Do not strain to have a bowel movement.  Avoid crossing your legs while sitting down.  Check your temperature every day for a fever. A fever may be a sign of infection.  If you are a woman and you plan to become pregnant, talk with your health care provider before you become pregnant.  Wear compression stockings  if your health care provider instructs you to do this. These stockings help to prevent blood clots and reduce swelling in your legs.  Tell all health care providers who care for you that you have an artificial (prosthetic) aortic valve. If you have or have had heart disease or endocarditis, tell all health care providers about these  conditions as well.  Keep all follow-up visits as told by your health care provider. This is important. Contact a health care provider if:  You develop a skin rash.  You experience sudden, unexplained changes in your weight.  You have more redness, swelling, or pain around your incision.  You have more fluid or blood coming from your incision.  Your incision feels warm to the touch.  You have pus or a bad smell coming from your incision.  You have a fever. Get help right away if:  You develop chest pain that is different from the pain coming from your incision.  You develop shortness of breath or difficulty breathing.  You start to feel light-headed. These symptoms may represent a serious problem that is an emergency. Do not wait to see if the symptoms will go away. Get medical help right away. Call your local emergency services (911 in the [place] Do not drive yourself to the hospital. This information is not intended to replace advice given to you by your health care provider. Make sure you discuss any questions you have with your health care provider. Document Released: 10/21/2004 Document Revised: 09/10/2015 Document Reviewed: 03/08/2015 Elsevier Interactive Patient Education  2017 [employer].

## 2017-09-08 NOTE — Progress Notes (Signed)
CARDIAC REHAB PHASE I   PRE:  Rate/Rhythm: 96 SR  BP:  Supine: 84/55  Sitting:   Standing:    SaO2: 95% 3L  MODE:  Ambulation: 100 ft   POST:  Rate/Rhythm: 120 ST  BP:  Supine: 98/78  Sitting:   Standing:    SaO2: 95%3L 1500-1530 Pt walked 100 ft on 3L with rolling walker and asst x 1. Denied dizziness with walk. DOE noted. To bathroom for BM. Helped clean pt and then back to bed. Left on 3L as pt still with some SOB. Deconditioned. Sats good on 3L.   Graylon Good, RN BSN  09/08/2017 3:24 PM

## 2017-09-08 NOTE — Discharge Summary (Signed)
Physician Discharge Summary  Patient ID: [name] [name] MRN: [id number] DOB/AGE: 07-05-1965 52 y.o.  Admit date: 09/01/2017 Discharge date: 09/12/2017  Admission Diagnoses: Severe aortic insufficiency and coronary artery disease  Discharge Diagnoses:  Principal Problem:   S/P aortic valve replacement with bileaflet mechanical valve + CABG x1 Active Problems:   ESRD (end stage renal disease) (HCC)   Morbid obesity (HCC)   End stage renal disease (HCC)   Systolic CHF ([place])   Acute on chronic combined systolic and diastolic heart failure (HCC)   Severe aortic insufficiency   Mitral regurgitation   Coronary artery disease involving native coronary artery of native heart without angina pectoris   S/P CABG x 1   Patient Active Problem List   Diagnosis Date Noted  . S/P aortic valve replacement with bileaflet mechanical valve + CABG x1 09/01/2017  . S/P CABG x 1 09/01/2017  . Coronary artery disease involving native coronary artery of native heart without angina pectoris   . 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 Abnormal echocardiogram   . Mitral regurgitation   . Severe aortic insufficiency   . Acute on chronic combined systolic and diastolic heart failure ([place])   . Systolic CHF ([place]) 32/35/5732  . 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 Dermatitis 04/06/2016  . End stage renal disease ([place]) 12/22/2011  . Sinus tachycardia   . ESRD (end stage renal disease) ([place])   . Orthostatic hypotension   . Chest discomfort   . Morbid obesity ([place])   . Syncope   . Aneurysm [facility])     HPI:  Patient is a 52 year old morbidly obese African-American male with history of hypertension, chronic diastolic congestive heart failure, and 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    The patient was originally seen  in consultation during his recent hospitalization for acute exacerbation of chronic combined systolic and diastolic congestive heart failure.  He has long-standing history of severe hypertension and approximately 20 years ago he began to develop symptoms of chronic diastolic congestive heart failure in the setting of progressive renal failure. He ultimately became dialysis dependent approximately 17 years ago. An echocardiogram performed 12 years ago revealed normal left ventricular systolic function with no mention of any significant valvular disease. Recently the patient developed persistent dry nonproductive cough and orthopnea. He was referred for cardiology consultation and evaluated by Dr. Bronson Ing 2 weeks ago. Transthoracic echocardiogram performed April 29, 2017 revealed severe left ventricular systolic dysfunction with ejection fraction estimated 20-25%. There was at least mild to moderate aortic insufficiency and question of possible vegetation beneath the aortic valve in the left ventricular outflow tract. There was mild mitral regurgitation. The patient was contacted via telephone and brought in for hospital admission for further workup and therapy. Multiple sets of blood cultures were obtained and remain no growth to date. Transesophageal echocardiogram performed July 31, 2017 revealed severe aortic insufficiency with no clear evidence of vegetation. There was moderate mitral regurgitation. There was severe left ventricular systolic dysfunction with ejection fraction estimated 25-30%. There was significant left ventricular hypertrophy. Left atrium was dilated. Cardiothoracic surgical consultation was requested.  The patient subsequently underwent left and right heart catheterization on August 02, 2017.  He was found to have multivessel coronary artery disease including high-grade proximal stenosis of the left circumflex coronary artery.  There was moderate pulmonary hypertension with  PA pressures measured  53/24 with mean pulmonary capillary wedge pressure 26 mmHg and central venous pressure 11 mmHg.  There were no large V waves seen on wedge tracing.  Forward cardiac output was preserved with mixed venous oxygen saturation 61% at baseline.  The patient subsequently was seen in consultation by the dental service and underwent dental extraction.  He was eventually discharged home and returns the office today with tentative plans to proceed with elective surgical intervention next week.  Patient is [place].He has been disabled since he went on hemodialysis approximately 17 years ago. He still drives an automobile and remains reasonably functional. He has been morbidly obese for all of his adult life. He states that he ambulates without significant difficulty. He denies any symptoms of shortness of breath. He has not been having problems with dialysis treatments recently. He denies any history of fevers, chills, or night sweats. Appetite is stable. He complains of a dry persistent cough that is made worse laying flat in bed. He has had episodes of PND. He denies palpitations, dizzy spells, or syncope. He has not had lower extremity edema. He dialyzes on a Tuesday, Thursday, Saturday schedule at the [facility] dialysis center via a left forearm primary AV fistula. He has not had any problems with dialysis access for the past 2 years.  Prior to his recent hospitalization the patient had been anticoagulated chronically using Eliquis because of previous history of clotting of dialysis grafts.  Eliquis was not resumed at the time of hospital discharge.  The patient reports no new problems or complaints since hospital discharge.  He states that his breathing has been stable.  Dialysis treatments have been uneventful.  He has not had any fevers or chills.  Final report of blood cultures obtained during his recent hospitalization were no  growth.  After full evaluation of the patient and studies and examination of the patient he was felt to be a candidate for proceeding with surgical intervention and was admitted this hospitalization for the procedure.   Discharged Condition: fair  Hospital Course: Patient was admitted electively and on 09/01/2017 taken the operating room where he underwent the below described procedure.  He tolerated it well and was taken to the surgical intensive care unit in stable condition.  Postoperative hospital course:  The patient has had a complex postop but has made steady improvement throughout.  We were assisted in the management of his dialysis by the nephrology service and initially they did CVVH with transition to his hemodialysis.  Additionally the advanced heart failure team was consulted to assist with management of his congestive heart failure/cardiogenic shock.  He initially required a substantial pressor requirement with norepinephrine, vasopressin and also milrinone.  Initially there was some difficulty removing fluid on CVVH due to marginal blood pressures.  On postoperative day 2 he developed SVT and atrial fibrillation.  He was placed on amiodarone drip.  Inotropic support was slowly weaned over time.  He was extubated from the ventilator on 09/03/2017.` Over time the Swan-Ganz chest tubes and JP drains were discontinued.  He was started on Coumadin for mechanical aortic valve.  His fluid status has shown some steady improvement with dialysis.  He will require hemodialysis long-term as previously.  He does have an expected acute blood loss anemia and values are stable.  Most recent BUN and creatinine are 17/4.64 respectively.  He is showing steady progress in his physical recovery.  INR at time of discharge is 2.08 and he will be  discharged on Coumadin at alternating 2.5 on dialysis days and 5 mg per day.  At time of discharge the patient is felt to be quite stable.  Consults: cardiology and  nephrology  Significant Diagnostic Studies: routine post op serial labs and CXR's  Treatments: CARDIOTHORACIC SURGERY OPERATIVE NOTE  Date of Procedure:                09/01/2017  Preoperative Diagnosis:       ? Severe Aortic Insufficiency ? Severe Single-vessel Coronary Artery Disease  Postoperative Diagnosis:    Same  Procedure:        Aortic Valve Replacement             [employer] Top Hat bileaflet mechanical valve (size 29mm, catalog # [id number], serial # [id number])   Coronary Artery Bypass Grafting x 1             Reversed Greater Saphenous Vein Graft to Obtuse Marginal Branch of Left Circumflex Coronary Artery             Endoscopic Vein Harvest from Right Thigh   Ultrasound and flouroscopic placement of right femoral venous lines and Swan-Ganz pulmonary artery catheter             6Fr triple lumen catheter             8Fr introducing sheath   Placement of left femoral venous temporary dialysis catheter               Surgeon:        Valentina Gu. Roxy Manns, MD  Assistant:       John Giovanni, PA-C  Anesthesia:    Laurie Panda, MD  Operative Findings: ? 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 Intraoperative Gram stain w/ no organisms seen ? Severe aortic insufficiency ? Dilated left ventricle with severe LV systolic dysfunction ? Good quality saphenous vein conduit ? Poor quality target vessels for grafting with diffuse calcification of all epicardial coronary arteries    Dialysis: Initially CVVH managed by the nephrologist.  Discharge Exam: Blood pressure (!) 104/59, pulse 93, temperature 98.5 F (36.9 C), temperature source Oral, resp. rate (!) 27, height 6' (1.829 m), weight (!) 308 lb 1.6 oz (139.8 kg), SpO2 98 %.  General appearance: alert, cooperative and no distress Heart: regular rate and rhythm and + valve click Lungs: clear to auscultation bilaterally Abdomen:  soft, non-tender; bowel sounds normal; no masses,  no organomegaly Extremities: edema tracec Wound: clean and dry   Disposition: discharged home  Discharge Instructions    Amb Referral to Cardiac Rehabilitation   Complete by:  As directed    Diagnosis:   CABG Valve Replacement     Valve:  Aortic   CABG X ___:  1     Allergies as of 09/10/2017   No Known Allergies     Medication List    STOP taking these medications   ivabradine 5 MG Tabs tablet Commonly known as:  CORLANOR   metoprolol succinate 50 MG 24 hr tablet Commonly known as:  TOPROL-XL   nitroGLYCERIN 0.4 MG SL tablet Commonly known as:  NITROSTAT   oxyCODONE-acetaminophen 5-325 MG tablet Commonly known as:  PERCOCET     TAKE these medications   amiodarone 200 MG tablet Commonly known as:  PACERONE Take 1 tablet (200 mg total) by mouth 2 (two) times daily. X 7 days, then decrease to 200 mg daily  aspirin 81 MG EC tablet Take 1 tablet (81 mg total) by mouth daily.   atorvastatin 40 MG tablet Commonly known as:  LIPITOR Take 1 tablet (40 mg total) by mouth daily at 6 PM.   benzonatate 100 MG capsule Commonly known as:  TESSALON 2 po tid for cough   chlorhexidine 0.12 % solution Commonly known as:  PERIDEX Rinse with 15 mls twice daily for 30 seconds. Use after breakfast and at bedtime. Spit out excess. Do not swallow. What changed:    how much to take  how to take this  when to take this  additional instructions   FOSRENOL 500 MG chewable tablet Generic drug:  lanthanum Chew 1,500-2,000 mg by mouth See admin instructions. Take 2000 mg by mouth with meals and take 1500 mg by mouth with snacks   midodrine 5 MG tablet Commonly known as:  PROAMATINE Take 3 tablets (15 mg total) by mouth 3 (three) times daily with meals. What changed:    how much to take  when to take this  additional instructions   NEPHRO-VITE PO Take 1 tablet by mouth daily.   oxyCODONE 5 MG immediate release  tablet Commonly known as:  Oxy IR/ROXICODONE Take 1 tablet (5 mg total) by mouth every 4 (four) hours as needed for severe pain.   warfarin 5 MG tablet Commonly known as:  COUMADIN Take 1 tablet (5 mg total) by mouth daily at 6 PM. Every Mon, Wed, Friday, and Sun   warfarin 2.5 MG tablet Commonly known as:  COUMADIN Take 1 tablet (2.5 mg total) by mouth daily. Every [place], Sat      Follow-up Information    [name], MD Follow up.   Specialty:  Cardiothoracic Surgery Why:  Appointment to see the surgeon on 10/09/2017 at 1 PM.  Please obtain a chest x-ray at [facility] at 12:30 PM.  [facility] imaging is located in the same office complex on the first floor. Contact information: [address] [place] [address] [phone]        Herminio Commons, MD Follow up.   Specialty:  Cardiology Why:  You have an appointment for cardiology hospital follow up on June 5th at 2:00 with Dr. Bronson Ing at the [place] office.  Contact information: [place] [address] [phone]        [facility] Follow up.   Specialty:  Cardiology Why:  Office has to reschedule appointment to conflict with dialysis.  If you do not hear from them by Tuesday afternoon.  Please contact to get follow up for either Wednesday 5/29 or Thursday 5/30 after dialysis Contact information: [place] [phone]          Signed: Ellwood Handler 09/12/2017, 9:02 PM

## 2017-09-08 NOTE — Progress Notes (Signed)
[facility] Rounding Note  Background:  52 year old [name] HTN, CM w/low EF (20-25), CAD, ESRD [facility] (on HD for 17 years!!), secondary HPT, anemia, chronic hypotension, central vein occlusions. S/P single vessel CABG/AoVR 01/06/18 complicated by shock requiring pressors. CRRT via L fem cath 09/02/17 - 5/22. Transitioned to IHD 5/23  Usual HD orders [facility] 4 hours and 45 min TTS  Left AVF  450/800- 2K/2.5 calc  EDW 140.5 2000 per hour of heparin Venofer 50 qTues Hect 12 mcg q tx/parsabiv 2.5 q tx  Assessment/Recommendations   1. ESRD - usual TTS [facility]. CRRT post CABG/valve replacement d/t hemodynamic instability. Fem vas cath placed 5/18. CRRT 5/18-5/22. IHD 5/24. 1.5 L off. Hypotension. ^ midodrine to 15 TID 2. Vascular access - L AVF. Apparently all central vv occluded up top 3. Hypotension- off IV pressors, on po midodrine. Hypotension chronic. ^ midodrine 15 TID 4. S/p mechanical AoVR and single vessel (SVG to OM) CABG 5/17 . ASA and warfarin  5. SVT/AF - NSR on amio at present. Some episodes SVT. Per cards  6. Anemia - Hb <10. No ESA as outpt. TSat low at 10% so 8 doses Ferrlecit ordered as load. Aranesp 100 given 5/23. For Hb 8.2 will give 1 unit PRBC's with next HD 7. Secondary HPT - normally on parsabiv (not available here) and hectorol. Off parsibiv has developed some hypercalcemia and since not available here and Ca high will not start hectorol back now as would make ^ Ca worse. Says has taken sensipar no prob in the past so if recurrent ^Ca through the weekend could use sensipar if we needed to. Resumed fosrenol 4 ac, 2-3 snacks at home.  Jamal Maes, MD [place] Pager 09/08/2017, 10:07 AM   Subjective:    Sig hypotension w/HD 5/23 Midodrine 10->will ^ to 15 Work back toward EDW (prob won't get there before discharge)  Objective Vital signs in last 24 hours: Vitals:   09/08/17 0400 09/08/17 0500 09/08/17 0519  09/08/17 0816  BP:   92/80 (!) 80/31  Pulse: 97  99   Resp: 14  (!) 24   Temp: 98.7 F (37.1 C)   98 F (36.7 C)  TempSrc: Oral   Oral  SpO2: 95%  95%   Weight:  (!) 144.2 kg (317 lb 14.5 oz)    Height:       Weight change: 0 kg (0 lb)  Intake/Output Summary (Last 24 hours) at 09/08/2017 1007 Last data filed at 09/08/2017 0600 Gross per 24 hour  Intake 700 ml  Output 1500 ml  Net -800 ml   Physical Exam: Very pleasant [name] O2 2L VS as noted, SBP 90's S1S2 No S3 NSR on tele Ant lungs clear Chest tubes out Abd obese not tender SCD's in place, trace LE edema L FA AVF + bruit (but soft). All lines out   Recent Labs  Lab 09/06/17 0306  09/06/17 0321 09/07/17 0242 09/08/17 0303  NA 141   < > 141 141 140  K 3.7   < > 3.5 4.0 3.7  CL 90*   < > 89* 89* 93*  CO2 39*  --   --  40* 35*  GLUCOSE 113*   < > 196* 68 75  BUN 17   < > 12 27* 17  CREATININE 4.11*   < > 2.50* 5.70* 4.64*  CALCIUM 10.5*  --   --  9.4 8.9  PHOS 3.8  --   --  4.4 3.7   < > = values in this interval not displayed.    Recent Labs  Lab 09/03/17 0236  09/05/17 0300  09/06/17 0306 09/07/17 0242 09/08/17 0303  AST 32  --  16  --   --   --   --   ALT 11*  --  10*  --   --   --   --   ALKPHOS 71  --  84  --   --   --   --   BILITOT 0.7  --  0.8  --   --   --   --   PROT 6.0*  --  5.7*  --   --   --   --   ALBUMIN 2.5*  2.5*   < > 2.3*   < > 2.1* 2.1* 2.4*   < > = values in this interval not displayed.    Recent Labs  Lab 09/04/17 0913  09/05/17 1315  09/06/17 0306  09/06/17 0321 09/07/17 0242 09/08/17 0303  WBC 14.0*  --  10.1  --  8.5  --   --  7.6 8.0  NEUTROABS  --   --   --   --  6.4  --   --  5.4 5.6  HGB 9.7*   < > 8.6*   < > 8.6*   < > 10.5* 8.4* 8.2*  HCT 32.0*   < > 28.9*   < > 29.0*   < > 31.0* 28.5* 28.6*  MCV 82.3  --  83.0  --  83.3  --   --  84.3 86.9  PLT 207  --  176  --  155  --   --  171 212   < > = values in this interval not displayed.    Recent Labs  Lab  09/04/17 0616 09/04/17 0706 09/04/17 0757 09/04/17 0917 09/06/17 1229  GLUCAP 139* 136* 129* 142* 71       Component Value Date/Time   IRON 15 (L) 09/04/2017 0913   TIBC 151 (L) 09/04/2017 0913   IRONPCTSAT 10 (L) 09/04/2017 0913    Infusions: . sodium chloride Stopped (09/04/17 0700)  . ferric gluconate (FERRLECIT/NULECIT) IV Stopped (09/07/17 1258)   Scheduled Medications: . amiodarone  200 mg Oral BID  . aspirin EC  81 mg Oral Daily  . atorvastatin  40 mg Oral q1800  . bisacodyl  10 mg Oral Daily   Or  . bisacodyl  10 mg Rectal Daily  . Chlorhexidine Gluconate Cloth  6 each Topical Daily  . darbepoetin (ARANESP) injection - DIALYSIS  100 mcg Intravenous Q Thu-HD  . docusate sodium  200 mg Oral Daily  . feeding supplement (NEPRO CARB STEADY)  237 mL Oral TID BM  . lanthanum  2,000 mg Oral TID WC  . midodrine  10 mg Oral TID WC  . [START ON 09/09/2017] midodrine  15 mg Oral Q T,Th,Sa-HD  . moving right along book   Does not apply Once  . multivitamin  1 tablet Oral QHS  . pantoprazole  40 mg Oral Daily  . sodium chloride flush  10-40 mL Intracatheter Q12H  . sodium chloride flush  3 mL Intravenous Q12H  . warfarin  5 mg Oral q1800  . Warfarin - Physician Dosing Inpatient   Does not apply [phone]

## 2017-09-08 NOTE — Progress Notes (Signed)
Patient ID: [name] [name], male   DOB: 07-31-1965, 52 y.o.   MRN: [id number] P    Advanced Heart Failure Rounding Note  PCP-Cardiologist: Kate Sable, MD   Subjective:    Evening of 09/02/17 went into SVT at 160, started on amiodarone gtt.  He then went into atrial fibrillation in the 100s. This morning, he is in NSR on amiodarone.   Extubated 09/03/17. He is now off pressors and CVVH.  Walked in hall already today, feels good.  He had HD yesterday, dropped BP into 70s and had minimal UF.  SBP in 90s this morning.    Objective:   Weight Range: (!) 317 lb 14.5 oz (144.2 kg) Body mass index is 43.12 kg/m.   Vital Signs:   Temp:  [97.5 F (36.4 C)-99.2 F (37.3 C)] 98.7 F (37.1 C) (05/24 0400) Pulse Rate:  [87-132] 99 (05/24 0519) Resp:  [0-34] 24 (05/24 0519) BP: (76-98)/(45-80) 92/80 (05/24 0519) SpO2:  [89 %-100 %] 95 % (05/24 0519) Weight:  [317 lb 14.5 oz (144.2 kg)-321 lb 6.9 oz (145.8 kg)] 317 lb 14.5 oz (144.2 kg) (05/24 0500) Last BM Date: 09/06/17  Weight change: Filed Weights   09/07/17 1100 09/07/17 1550 09/08/17 0500  Weight: (!) 321 lb 6.9 oz (145.8 kg) (!) 318 lb 2 oz (144.3 kg) (!) 317 lb 14.5 oz (144.2 kg)    Intake/Output:   Intake/Output Summary (Last 24 hours) at 09/08/2017 0808 Last data filed at 09/08/2017 0600 Gross per 24 hour  Intake 940 ml  Output 1500 ml  Net -560 ml      Physical Exam    General: NAD Neck: Thick, no JVD, no thyromegaly or thyroid nodule.  Lungs: Clear to auscultation bilaterally with normal respiratory effort. CV: Nondisplaced PMI.  Heart regular S1/S2 with mechanical S2, no S3/S4, no murmur.  No peripheral edema.   Abdomen: Soft, nontender, no hepatosplenomegaly, no distention.  Skin: Intact without lesions or rashes.  Neurologic: Alert and oriented x 3.  Psych: Normal affect. Extremities: No clubbing or cyanosis.  HEENT: Normal.    Telemetry   NSR 100s, no SVT or afib, personally reviewed.   Labs     CBC Recent Labs    09/07/17 0242 09/08/17 0303  WBC 7.6 8.0  NEUTROABS 5.4 5.6  HGB 8.4* 8.2*  HCT 28.5* 28.6*  MCV 84.3 86.9  PLT 171 035   Basic Metabolic Panel Recent Labs    09/06/17 0306  09/07/17 0242 09/08/17 0303  NA 141   < > 141 140  K 3.7   < > 4.0 3.7  CL 90*   < > 89* 93*  CO2 39*  --  40* 35*  GLUCOSE 113*   < > 68 75  BUN 17   < > 27* 17  CREATININE 4.11*   < > 5.70* 4.64*  CALCIUM 10.5*  --  9.4 8.9  MG 2.0  --   --   --   PHOS 3.8  --  4.4 3.7   < > = values in this interval not displayed.   Liver Function Tests Recent Labs    09/07/17 0242 09/08/17 0303  ALBUMIN 2.1* 2.4*   No results for input(s): LIPASE, AMYLASE in the last 72 hours. Cardiac Enzymes No results for input(s): CKTOTAL, CKMB, CKMBINDEX, TROPONINI in the last 72 hours.  BNP: BNP (last 3 results) Recent Labs    07/28/17 1831  BNP 1,098.0*    ProBNP (last 3 results) No results for input(s):  PROBNP in the last 8760 hours.   D-Dimer No results for input(s): DDIMER in the last 72 hours. Hemoglobin A1C No results for input(s): HGBA1C in the last 72 hours. Fasting Lipid Panel No results for input(s): CHOL, HDL, LDLCALC, TRIG, CHOLHDL, LDLDIRECT in the last 72 hours. Thyroid Function Tests No results for input(s): TSH, T4TOTAL, T3FREE, THYROIDAB in the last 72 hours.  Invalid input(s): FREET3  Other results:   Imaging    No results found.   Medications:     Scheduled Medications: . amiodarone  200 mg Oral BID  . aspirin EC  81 mg Oral Daily  . atorvastatin  40 mg Oral q1800  . bisacodyl  10 mg Oral Daily   Or  . bisacodyl  10 mg Rectal Daily  . Chlorhexidine Gluconate Cloth  6 each Topical Daily  . darbepoetin (ARANESP) injection - DIALYSIS  100 mcg Intravenous Q Thu-HD  . docusate sodium  200 mg Oral Daily  . feeding supplement (NEPRO CARB STEADY)  237 mL Oral TID BM  . lanthanum  2,000 mg Oral TID WC  . midodrine  10 mg Oral TID WC  . moving right  along book   Does not apply Once  . multivitamin  1 tablet Oral QHS  . pantoprazole  40 mg Oral Daily  . sodium chloride flush  10-40 mL Intracatheter Q12H  . sodium chloride flush  3 mL Intravenous Q12H  . warfarin  5 mg Oral q1800  . Warfarin - Physician Dosing Inpatient   Does not apply q1800    Infusions: . sodium chloride Stopped (09/04/17 0700)  . ferric gluconate (FERRLECIT/NULECIT) IV Stopped (09/07/17 1258)    PRN Medications: heparin, morphine injection, ondansetron (ZOFRAN) IV, oxyCODONE, sodium chloride flush, sodium chloride flush, traMADol   Assessment/Plan   1. 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 No active vegetation seen.  Blood cultures werenegativeduring 4/19 hospitalization. Severe AI could certainly potentiate CHF. This was not see on 2012 echo. The LV is not significantly dilated, so not sure AI is the only explanation for his cardiomyopathy. s/p mechanical AVR 09/01/17.  - Continue ASA, warfarin started.  INR 1.66 today.   - Will eventually need post op Echo.  2. Acute on chronic systolic CHF/cardiogenic shock: Echo 4/19with EF 20-25%. As LV is not markedly dilated, I am not sure that aortic insufficiency can be invoked as the only cause for cardiomyopathy. He has hadchronic sinus tachycardia x years but HR has been in 100s-110s, do not think this would have been likely to cause a tachy-mediated CMP. He has serial 80% stenoses in the LCx, but do not think this can explain all of the cardiomyopathy either. It is possible thatcardiomyopathyis multifactorial. Post-op, he has been hypotensive.  However, at baseline his BP is marginal and he requires midodrine with HD. He has had reasonable cardiac output by [name] (now removed).  He is now off pressors and off CVVH.  Hypotension with HD yesterday.  - Continue midodrine 10 mg  tid for now. Will give him 15 mg midodrine pre-HD.  - Will leave off Corlanor for now with episode of atrial fibrillation this admission.   3. ESRD:Hypotension with HD yesterday, increase midodrine dose pre-HD.  4. OXB:DZHGDJM 5/18 evening and again on 5/22.  Now on po amiodarone. SVT tends to be in the 140s-150s.   - Continue amiodarone for now.  - Would eventually  involve EP (as outpatient) and consider ablation as this is recurrent and rapid.  5. CAD: S/p SVG-OM.  - Continue statin and ASA.  6. Atrial fibrillation:  He is in NSR this am on amiodarone.  - Now on po amiodarone.  - Coumadin started 09/03/17. TCTS dosing.   Unless called, we will see again after the weekend.   Length of Stay: 7  Loralie Champagne, MD  09/08/2017, 8:08 AM  Advanced Heart Failure Team Pager [phone] (M-F; 7a - 4p)  Please contact [facility] for night-coverage after hours (4p -7a ) and weekends on [email or web address]

## 2017-09-08 NOTE — Progress Notes (Addendum)
TCTS DAILY ICU PROGRESS NOTE                   [address]            [place] [address]          [phone]   7 Days Post-Op Procedure(s) (LRB): AORTIC VALVE REPLACEMENT (AVR) (N/A) CORONARY ARTERY BYPASS GRAFTING (CABG) X 1, USING RIGHT GREATER SAPHENOUS VEIN HARVESTED ENDOSCOPICALLY (N/A) TRANSESOPHAGEAL ECHOCARDIOGRAM (TEE) (N/A) FLOROSCOPY GUIDED PLACEMENT OF RIGHT FEMORAL CENTRAL LINE TIMES TWO  AND PLACEMENT OF SWAN GANZ CATHETER (Right) PLACEMENT OF TRIALYSIS SHORT TERM DIALYSIS CATHETER  Total Length of Stay:  LOS: 7 days   Subjective: Patient sitting on edge of bed as is awaiting assistance to be put back into bed. He has no specific complaints this am.  Objective: Vital signs in last 24 hours: Temp:  [97.5 F (36.4 C)-99.2 F (37.3 C)] 98.7 F (37.1 C) (05/24 0400) Pulse Rate:  [87-132] 99 (05/24 0519) Cardiac Rhythm: Normal sinus rhythm (05/24 0400) Resp:  [0-34] 24 (05/24 0519) BP: (76-98)/(45-80) 92/80 (05/24 0519) SpO2:  [89 %-100 %] 95 % (05/24 0519) Weight:  [317 lb 14.5 oz (144.2 kg)-321 lb 6.9 oz (145.8 kg)] 317 lb 14.5 oz (144.2 kg) (05/24 0500)  Filed Weights   09/07/17 1100 09/07/17 1550 09/08/17 0500  Weight: (!) 321 lb 6.9 oz (145.8 kg) (!) 318 lb 2 oz (144.3 kg) (!) 317 lb 14.5 oz (144.2 kg)    Weight change: 0 lb (0 kg)      Intake/Output from previous day: 05/23 0701 - 05/24 0700 In: 940 [P.O.:940] Out: 1500   Intake/Output this shift: No intake/output data recorded.  Current Meds: Scheduled Meds: . amiodarone  200 mg Oral BID  . aspirin EC  81 mg Oral Daily  . atorvastatin  40 mg Oral q1800  . bisacodyl  10 mg Oral Daily   Or  . bisacodyl  10 mg Rectal Daily  . Chlorhexidine Gluconate Cloth  6 each Topical Daily  . darbepoetin (ARANESP) injection - DIALYSIS  100 mcg Intravenous Q Thu-HD  . docusate sodium  200 mg Oral Daily  . feeding supplement (NEPRO CARB STEADY)  237 mL Oral TID BM  . lanthanum  2,000 mg  Oral TID WC  . midodrine  10 mg Oral TID WC  . moving right along book   Does not apply Once  . multivitamin  1 tablet Oral QHS  . pantoprazole  40 mg Oral Daily  . sodium chloride flush  10-40 mL Intracatheter Q12H  . sodium chloride flush  3 mL Intravenous Q12H  . warfarin  5 mg Oral q1800  . Warfarin - Physician Dosing Inpatient   Does not apply q1800   Continuous Infusions: . sodium chloride Stopped (09/04/17 0700)  . ferric gluconate (FERRLECIT/NULECIT) IV Stopped (09/07/17 1258)   PRN Meds:.heparin, morphine injection, ondansetron (ZOFRAN) IV, oxyCODONE, sodium chloride flush, sodium chloride flush, traMADol  General appearance: alert, cooperative and no distress Heart: RRR, sharp valve click Lungs: Mostly clear Abdomen: Soft, obese, bowel sounds present Extremities: Mild bilateral LE edema. Has bruit left forearm fistula. Wound: Sternal wound is clean and dry. Right LE dressing is clean and dry.  Lab Results: CBC: Recent Labs    09/07/17 0242 09/08/17 0303  WBC 7.6 8.0  HGB 8.4* 8.2*  HCT 28.5* 28.6*  PLT 171 212   BMET:  Recent Labs    09/07/17 0242 09/08/17 0303  NA 141 140  K 4.0 3.7  CL 89* 93*  CO2 40* 35*  GLUCOSE 68 75  BUN 27* 17  CREATININE 5.70* 4.64*  CALCIUM 9.4 8.9    CMET: Lab Results  Component Value Date   WBC 8.0 09/08/2017   HGB 8.2 (L) 09/08/2017   HCT 28.6 (L) 09/08/2017   PLT 212 09/08/2017   GLUCOSE 75 09/08/2017   CHOL 169 05/16/2013   TRIG 97 05/16/2013   HDL 35 (L) 05/16/2013   LDLCALC 115 (H) 05/16/2013   ALT 10 (L) 09/05/2017   AST 16 09/05/2017   NA 140 09/08/2017   K 3.7 09/08/2017   CL 93 (L) 09/08/2017   CREATININE 4.64 (H) 09/08/2017   BUN 17 09/08/2017   CO2 35 (H) 09/08/2017   TSH 1.670 09/03/2017   INR 1.66 09/08/2017   HGBA1C 5.0 08/21/2017    PT/INR:  Recent Labs    09/08/17 0303  LABPROT 19.5*  INR 1.66   Radiology: No results found.   Assessment/Plan: S/P Procedure(s) (LRB): AORTIC VALVE  REPLACEMENT (AVR) (N/A) CORONARY ARTERY BYPASS GRAFTING (CABG) X 1, USING RIGHT GREATER SAPHENOUS VEIN HARVESTED ENDOSCOPICALLY (N/A) TRANSESOPHAGEAL ECHOCARDIOGRAM (TEE) (N/A) FLOROSCOPY GUIDED PLACEMENT OF RIGHT FEMORAL CENTRAL LINE TIMES TWO  AND PLACEMENT OF SWAN GANZ CATHETER (Right) PLACEMENT OF TRIALYSIS SHORT TERM DIALYSIS CATHETER  1. CV-Had brief SVT early post op. He has been on Amiodarone. Also, on Coumadin for mechanical AVR. INR increased from 1.38 to 1.66. Continue with Coumadin 5 mg for now. His BP has been labile and so is on Midodrine 10 mg tid. 2. Pulmonary-On 2 liters of oxygen via [place]. Wean to room air as able. Encourage incentive spirometer. 3. ESRD-Had CVVH post op via femoral catheter. Now HD via left forearm fistula. Creatinine 4.64 this am 4. Anemia related to above and surgery-H and H stable at 8.2 and 28.6. On Aranesp and Nulecit. 5. Transfer to 4E    Laraina Sulton Liston Alba PA-C 09/08/2017 8:14 AM

## 2017-09-09 LAB — CBC
HEMATOCRIT: 29.6 % — AB (ref 39.0–52.0)
HEMOGLOBIN: 8.7 g/dL — AB (ref 13.0–17.0)
MCH: 25.1 pg — AB (ref 26.0–34.0)
MCHC: 29.4 g/dL — ABNORMAL LOW (ref 30.0–36.0)
MCV: 85.3 fL (ref 78.0–100.0)
Platelets: 216 10*3/uL (ref 150–400)
RBC: 3.47 MIL/uL — AB (ref 4.22–5.81)
RDW: 16.5 % — ABNORMAL HIGH (ref 11.5–15.5)
WBC: 8.6 10*3/uL (ref 4.0–10.5)

## 2017-09-09 LAB — RENAL FUNCTION PANEL
ANION GAP: 12 (ref 5–15)
Albumin: 2.4 g/dL — ABNORMAL LOW (ref 3.5–5.0)
BUN: 27 mg/dL — ABNORMAL HIGH (ref 6–20)
CHLORIDE: 94 mmol/L — AB (ref 101–111)
CO2: 33 mmol/L — ABNORMAL HIGH (ref 22–32)
CREATININE: 6.79 mg/dL — AB (ref 0.61–1.24)
Calcium: 9 mg/dL (ref 8.9–10.3)
GFR calc Af Amer: 10 mL/min — ABNORMAL LOW (ref >60)
GFR, EST NON AFRICAN AMERICAN: 8 mL/min — AB (ref >60)
Glucose, Bld: 72 mg/dL (ref 65–99)
POTASSIUM: 3.6 mmol/L (ref 3.5–5.1)
Phosphorus: 4.3 mg/dL (ref 2.5–4.6)
Sodium: 139 mmol/L (ref 135–145)

## 2017-09-09 LAB — PREPARE RBC (CROSSMATCH)

## 2017-09-09 LAB — PROTIME-INR
INR: 2.08
Prothrombin Time: 23.3 seconds — ABNORMAL HIGH (ref 11.4–15.2)

## 2017-09-09 MED ORDER — MIDODRINE HCL 5 MG PO TABS
ORAL_TABLET | ORAL | Status: AC
  Filled 2017-09-09: qty 3

## 2017-09-09 MED ORDER — SODIUM CHLORIDE 0.9 % IV SOLN: Freq: Once | INTRAVENOUS | Status: DC

## 2017-09-09 NOTE — Progress Notes (Signed)
Patient arrived to the unit from dialysis. Received report from Sal, in dialysis. He reported that patient received 1 unit of packed cells (Hgb 8.7) and 3L was removed. Patient is alert and oriented. VS are stable and patient denies any pain. Patient is on 3L of O2. He states that he does not have an appetite and does not wish to eat his lunch. He is resting comfortably in bed. Will continue to monitor.

## 2017-09-09 NOTE — Progress Notes (Signed)
CARDIAC REHAB PHASE I   PRE:  Rate/Rhythm: 108 ST    BP: sitting 83/69    SaO2: 90-93 RA  MODE:  Ambulation: 220 ft   POST:  Rate/Rhythm: 123 ST    BP: sitting 70/59, recheck 92/59     SaO2: 93 RA  Pt feeling much better after fluid removal. Able to walk in hall with RW and on RA. Some SOB and rest x2 but HR elevated. SaO2 92 RA in hall.  BP low after walk however pt denies dizziness. Got pt another IS and discussed use. He is eager to go home today however he needs to have SaO2 checked while sleeping as he will probably need O2 at night. He also needs a RW for home. Ed completed with pt and family. Good reception. Will refer to [place].  [place], ACSM 09/09/2017 3:49 PM

## 2017-09-09 NOTE — Plan of Care (Signed)
  Problem: Education: Goal: Knowledge of General Education information will improve Outcome: Progressing   Problem: Health Behavior/Discharge Planning: Goal: Ability to manage health-related needs will improve Outcome: Progressing   Problem: Clinical Measurements: Goal: Ability to maintain clinical measurements within normal limits will improve Outcome: Progressing   Problem: Skin Integrity: Goal: Wound healing without signs and symptoms of infection Outcome: Progressing

## 2017-09-09 NOTE — Procedures (Signed)
I have personally attended this patient's dialysis session.   Midodrine 15 mg  Transfused 1 unit No AVF issues BP more stable Pre HD standing weight 143.8 so predict will be very close to EDW of 140.5 post TMT  Jamal Maes, MD [place] Pager 09/09/2017, 10:56 AM

## 2017-09-09 NOTE — Progress Notes (Addendum)
      [address]       [place] [address]             [phone]      8 Days Post-Op Procedure(s) (LRB): AORTIC VALVE REPLACEMENT (AVR) (N/A) CORONARY ARTERY BYPASS GRAFTING (CABG) X 1, USING RIGHT GREATER SAPHENOUS VEIN HARVESTED ENDOSCOPICALLY (N/A) TRANSESOPHAGEAL ECHOCARDIOGRAM (TEE) (N/A) FLOROSCOPY GUIDED PLACEMENT OF RIGHT FEMORAL CENTRAL LINE TIMES TWO  AND PLACEMENT OF SWAN GANZ CATHETER (Right) PLACEMENT OF TRIALYSIS SHORT TERM DIALYSIS CATHETER   Subjective:  Patient undergoing dialysis.  No specific complaints.   Objective: Vital signs in last 24 hours: Temp:  [98 F (36.7 C)-98.7 F (37.1 C)] (P) 98.7 F (37.1 C) (05/25 0700) Pulse Rate:  [93-95] (P) 93 (05/25 0700) Cardiac Rhythm: Sinus tachycardia (05/25 0453) Resp:  [15-26] (P) 15 (05/25 0700) BP: (80-105)/(31-80) (P) 106/68 (05/25 0700) SpO2:  [92 %-99 %] (P) 99 % (05/25 0700) Weight:  [317 lb 1.6 oz (143.8 kg)] 317 lb 1.6 oz (143.8 kg) (05/25 0439)  Intake/Output from previous day: No intake/output data recorded. Intake/Output this shift: No intake/output data recorded.  General appearance: alert, cooperative and morbidly obese Heart: regular rate and rhythm Lungs: clear to auscultation bilaterally Abdomen: soft, non-tender; bowel sounds normal; no masses,  no organomegaly Extremities: edema mild Wound: clean and dry  Lab Results: Recent Labs    09/08/17 0303 09/09/17 0330  WBC 8.0 8.6  HGB 8.2* 8.7*  HCT 28.6* 29.6*  PLT 212 216   BMET:  Recent Labs    09/08/17 0303 09/09/17 0330  NA 140 139  K 3.7 3.6  CL 93* 94*  CO2 35* 33*  GLUCOSE 75 72  BUN 17 27*  CREATININE 4.64* 6.79*  CALCIUM 8.9 9.0    PT/INR:  Recent Labs    09/09/17 0330  LABPROT 23.3*  INR 2.08   ABG    Component Value Date/Time   PHART 7.409 09/03/2017 1110   HCO3 23.5 09/03/2017 1110   TCO2 31 09/06/2017 0321   ACIDBASEDEF 1.0 09/03/2017 1110   O2SAT 43.2 09/04/2017 0848   CBG  (last 3)  Recent Labs    09/06/17 1229  GLUCAP 71    Assessment/Plan: S/P Procedure(s) (LRB): AORTIC VALVE REPLACEMENT (AVR) (N/A) CORONARY ARTERY BYPASS GRAFTING (CABG) X 1, USING RIGHT GREATER SAPHENOUS VEIN HARVESTED ENDOSCOPICALLY (N/A) TRANSESOPHAGEAL ECHOCARDIOGRAM (TEE) (N/A) FLOROSCOPY GUIDED PLACEMENT OF RIGHT FEMORAL CENTRAL LINE TIMES TWO  AND PLACEMENT OF SWAN GANZ CATHETER (Right) PLACEMENT OF TRIALYSIS SHORT TERM DIALYSIS CATHETER  1. CV- PAF, in NSR- remains on Amiodarone, Midodrine for BP support 2 INR 2.08, continue coumadin at 5 mg daily 3. Renal- ESRD, HD today, care per neprhology 4. Expected post operative anemia- on Aranesp, Nucleit 5. Dispo- patient stable, continue coumadin, renal care per nephrology   LOS: 8 days    Ellwood Handler 09/09/2017   Chart reviewed, patient examined, agree with above. Tolerated HD well INR rising on 5 mg Coumadin daily so will need to watch closely. May need to decrease tomorrow. He is on amiodarone.

## 2017-09-09 NOTE — Progress Notes (Signed)
[facility] Rounding Note  Background:  52 year old [name] HTN, CM w/low EF (20-25), CAD, ESRD [facility] (on HD for 17 years!!), secondary HPT, anemia, chronic hypotension, central vein occlusions. S/P single vessel CABG/AoVR 1/61/09 complicated by shock requiring pressors. CRRT via L fem cath 09/02/17 - 5/22. Transitioned to IHD 5/23  Usual HD orders [facility] 4 hours and 45 min TTS  Left AVF  450/800- 2K/2.5 calc  EDW 140.5 2000 per hour of heparin Venofer 50 qTues Hect 12 mcg q tx/parsabiv 2.5 q tx  Assessment/Recommendations   1. ESRD - usual TTS [facility] .  1. CRRT post CABG/valve replacement d/t hemodynamic instability.  2. Fem vas cath placed 5/18.  3. CRRT 5/18-5/22.  4. IHD 5/23 hypotensinon ^ midodrine to 15 TID.  5. BP more stable today with higher midodrine.  6. Will be close to EDW post TMT.  2. Vascular access - L AVF. Apparently all central vv occluded up top 3. Hypotension- off IV pressors, on po midodrine. Hypotension chronic. ^ midodrine 15 TID 4. S/p mechanical AoVR and single vessel (SVG to OM) CABG 5/17 . ASA and warfarin  5. SVT/AF - NSR on amio at present. Some episodes SVT. Per cards   6. Anemia -  1. Hb <10.  2. No ESA as outpt.  3. TSat low at 10% so 8 doses Ferrlecit ordered as load.  4. Aranesp 100 given 5/23.  5. Transfused 1 unit PRBC's with HD today  7. Secondary HPT - normally on parsabiv (not available here) and hectorol. Off parsibiv has developed some hypercalcemia and since not available here and corrected Ca high will not start hectorol back now as would make ^ Ca worse. Says has taken sensipar no prob in the past so if recurrent ^Ca through the weekend could use sensipar if we needed to. Resumed fosrenol 4 ac, 2-3 snacks at home.  Jamal Maes, MD [facility] [phone] Pager 09/09/2017, 10:48 AM   Subjective:   Seen in HD unit Pre weight 143.8 Midodrine 15/transfused 1 unit BP holding  steady  Objective Vital signs in last 24 hours: Vitals:   09/09/17 0928 09/09/17 0945 09/09/17 1000 09/09/17 1030  BP: 100/65 99/70 95/65  99/72  Pulse: 88 87 87 89  Resp: (!) 25 (!) 28 (!) 29 (!) 26  Temp: 98.4 F (36.9 C) 97.6 F (36.4 C)    TempSrc: Oral Oral    SpO2: 100% 100% 100% 100%  Weight:      Height:       Weight change: -1.964 kg (-4 lb 5.3 oz)  Intake/Output Summary (Last 24 hours) at 09/09/2017 1048 Last data filed at 09/09/2017 8119 Gross per 24 hour  Intake 315 ml  Output -  Net 315 ml   Physical Exam: Very pleasant [name] O2 2L VS as noted, SBP 100 S1S2 No S3 NSR  Prosthetic valve click Ant lungs clear Chest tubes out Abd obese not tender No edema LE's L FA AVF cannulated All lines out   Recent Labs  Lab 09/07/17 0242 09/08/17 0303 09/09/17 0330  NA 141 140 139  K 4.0 3.7 3.6  CL 89* 93* 94*  CO2 40* 35* 33*  GLUCOSE 68 75 72  BUN 27* 17 27*  CREATININE 5.70* 4.64* 6.79*  CALCIUM 9.4 8.9 9.0  PHOS 4.4 3.7 4.3    Recent Labs  Lab 09/03/17 0236  09/05/17 0300  09/07/17 0242 09/08/17 0303 09/09/17 0330  AST 32  --  16  --   --   --   --  ALT 11*  --  10*  --   --   --   --   ALKPHOS 71  --  84  --   --   --   --   BILITOT 0.7  --  0.8  --   --   --   --   PROT 6.0*  --  5.7*  --   --   --   --   ALBUMIN 2.5*  2.5*   < > 2.3*   < > 2.1* 2.4* 2.4*   < > = values in this interval not displayed.    Recent Labs  Lab 09/05/17 1315  09/06/17 0306  09/07/17 0242 09/08/17 0303 09/09/17 0330  WBC 10.1  --  8.5  --  7.6 8.0 8.6  NEUTROABS  --   --  6.4  --  5.4 5.6  --   HGB 8.6*   < > 8.6*   < > 8.4* 8.2* 8.7*  HCT 28.9*   < > 29.0*   < > 28.5* 28.6* 29.6*  MCV 83.0  --  83.3  --  84.3 86.9 85.3  PLT 176  --  155  --  171 212 216   < > = values in this interval not displayed.    Recent Labs  Lab 09/04/17 0616 09/04/17 0706 09/04/17 0757 09/04/17 0917 09/06/17 1229  GLUCAP 139* 136* 129* 142* 71       Component Value  Date/Time   IRON 15 (L) 09/04/2017 0913   TIBC 151 (L) 09/04/2017 0913   IRONPCTSAT 10 (L) 09/04/2017 0913    Infusions: . sodium chloride Stopped (09/04/17 0700)  . sodium chloride    . ferric gluconate (FERRLECIT/NULECIT) IV 125 mg (09/09/17 1022)   Scheduled Medications: . amiodarone  200 mg Oral BID  . aspirin EC  81 mg Oral Daily  . atorvastatin  40 mg Oral q1800  . bisacodyl  10 mg Oral Daily   Or  . bisacodyl  10 mg Rectal Daily  . Chlorhexidine Gluconate Cloth  6 each Topical Daily  . darbepoetin (ARANESP) injection - DIALYSIS  100 mcg Intravenous Q Thu-HD  . docusate sodium  200 mg Oral Daily  . feeding supplement (NEPRO CARB STEADY)  237 mL Oral TID BM  . lanthanum  2,000 mg Oral TID WC  . midodrine  15 mg Oral TID WC  . moving right along book   Does not apply Once  . multivitamin  1 tablet Oral QHS  . pantoprazole  40 mg Oral Daily  . sodium chloride flush  10-40 mL Intracatheter Q12H  . sodium chloride flush  3 mL Intravenous Q12H  . warfarin  5 mg Oral q1800  . Warfarin - Physician Dosing Inpatient   Does not apply [phone]

## 2017-09-10 LAB — TYPE AND SCREEN
ABO/RH(D): B POS
Antibody Screen: NEGATIVE
Unit division: 0

## 2017-09-10 LAB — RENAL FUNCTION PANEL
Albumin: 2.6 g/dL — ABNORMAL LOW (ref 3.5–5.0)
Anion gap: 13 (ref 5–15)
BUN: 17 mg/dL (ref 6–20)
CHLORIDE: 96 mmol/L — AB (ref 101–111)
CO2: 28 mmol/L (ref 22–32)
Calcium: 8.9 mg/dL (ref 8.9–10.3)
Creatinine, Ser: 5.24 mg/dL — ABNORMAL HIGH (ref 0.61–1.24)
GFR calc Af Amer: 13 mL/min — ABNORMAL LOW (ref >60)
GFR, EST NON AFRICAN AMERICAN: 11 mL/min — AB (ref >60)
GLUCOSE: 62 mg/dL — AB (ref 65–99)
POTASSIUM: 3.8 mmol/L (ref 3.5–5.1)
Phosphorus: 3.7 mg/dL (ref 2.5–4.6)
Sodium: 137 mmol/L (ref 135–145)

## 2017-09-10 LAB — PROTIME-INR
INR: 2.45
Prothrombin Time: 26.4 seconds — ABNORMAL HIGH (ref 11.4–15.2)

## 2017-09-10 LAB — BPAM RBC
Blood Product Expiration Date: 201906012359
ISSUE DATE / TIME: 201905250858
Unit Type and Rh: 7300

## 2017-09-10 MED ORDER — OXYCODONE HCL 5 MG PO TABS: 5.0000 mg | ORAL_TABLET | ORAL | 0 refills | Status: DC | PRN

## 2017-09-10 MED ORDER — ASPIRIN 81 MG PO TBEC: 81.0000 mg | DELAYED_RELEASE_TABLET | Freq: Every day | ORAL | Status: DC

## 2017-09-10 MED ORDER — WARFARIN SODIUM 5 MG PO TABS: 5.0000 mg | ORAL_TABLET | Freq: Every day | ORAL | 3 refills | Status: DC

## 2017-09-10 MED ORDER — MIDODRINE HCL 5 MG PO TABS
15.0000 mg | ORAL_TABLET | Freq: Three times a day (TID) | ORAL | 2 refills | Status: DC
Start: 2017-09-10 — End: 2018-01-09

## 2017-09-10 MED ORDER — AMIODARONE HCL 200 MG PO TABS: 200.0000 mg | ORAL_TABLET | Freq: Two times a day (BID) | ORAL | 1 refills | Status: DC

## 2017-09-10 MED ORDER — WARFARIN SODIUM 2.5 MG PO TABS: 2.5000 mg | ORAL_TABLET | Freq: Every day | ORAL | 3 refills | Status: DC

## 2017-09-10 NOTE — Progress Notes (Addendum)
Patient is ready for discharge. He is alert and oriented and vital signs are stable. Patient has all of his belongings. He has stated that all of his questions regarding his discharge have been satisfied. Both IV's from the left arm have been removed, catheter intact and no complications. I have taken patient off of telemetry, box has been removed and CCMD has been notified. Patient will leave the unit in a wheelchair and will meet his wife at the front entrance of the hospital. Patient's wife will transport him home.

## 2017-09-10 NOTE — Care Management Note (Signed)
Case Management Note  Patient Details  Name: [name] [name] MRN: [id number] Date of Birth: 09-Jan-1966  Subjective/Objective:    Pt in for aortic valve replacement/CABG.  Pt to go home with daughter and wife to assist.  Pt has a 3n1 to use at home in the shower and feels he may need a RW for short-term use.    Pt and RN report pt did not require O2 during the night and O2 sats were stable.               Action/Plan: RW ordered from [facility].   Expected Discharge Date:                  Expected Discharge Plan:  Home/Self Care  In-House Referral:     Discharge planning Services  CM Consult  Post Acute Care Choice:  Durable Medical Equipment Choice offered to:  Patient  DME Arranged:  [place] rolling DME Agency:  [place]:  NA HH Agency:  NA  Status of Service:  Completed, signed off  If discussed at [place] of Stay Meetings, dates discussed:    Additional Comments:  Claudie Leach, RN 09/10/2017, 10:57 AM

## 2017-09-10 NOTE — Progress Notes (Signed)
[facility] Rounding Note   Subjective:   HD well tolerated yesterday Pre weight 143.8, post 139.8 so 140 new EDW  Objective Vital signs in last 24 hours: Vitals:   09/10/17 0300 09/10/17 0500 09/10/17 0806 09/10/17 1212  BP: 107/78  117/84 (!) 104/59  Pulse: 92  95 93  Resp: 20 16 (!) 23 (!) 27  Temp: 98.7 F (37.1 C)  98.7 F (37.1 C) 98.5 F (36.9 C)  TempSrc: Oral  Oral Oral  SpO2: 99%  96% 98%  Weight:  (!) 139.8 kg (308 lb 1.6 oz)    Height:       Weight change: -3.236 kg (-7 lb 2.1 oz)  Intake/Output Summary (Last 24 hours) at 09/10/2017 1423 Last data filed at 09/09/2017 1500 Gross per 24 hour  Intake -  Output 0 ml  Net 0 ml   Physical Exam: Very pleasant [name] VS as noted S1S2 No S3 NSR  Prosthetic valve click Ant lungs clear Abd obese not tender No edema LE's L FA AVF soft bruit  Recent Labs  Lab 09/08/17 0303 09/09/17 0330 09/10/17 0314  NA 140 139 137  K 3.7 3.6 3.8  CL 93* 94* 96*  CO2 35* 33* 28  GLUCOSE 75 72 62*  BUN 17 27* 17  CREATININE 4.64* 6.79* 5.24*  CALCIUM 8.9 9.0 8.9  PHOS 3.7 4.3 3.7    Recent Labs  Lab 09/05/17 0300  09/08/17 0303 09/09/17 0330 09/10/17 0314  AST 16  --   --   --   --   ALT 10*  --   --   --   --   ALKPHOS 84  --   --   --   --   BILITOT 0.8  --   --   --   --   PROT 5.7*  --   --   --   --   ALBUMIN 2.3*   < > 2.4* 2.4* 2.6*   < > = values in this interval not displayed.    Recent Labs  Lab 09/05/17 1315  09/06/17 0306  09/07/17 0242 09/08/17 0303 09/09/17 0330  WBC 10.1  --  8.5  --  7.6 8.0 8.6  NEUTROABS  --   --  6.4  --  5.4 5.6  --   HGB 8.6*   < > 8.6*   < > 8.4* 8.2* 8.7*  HCT 28.9*   < > 29.0*   < > 28.5* 28.6* 29.6*  MCV 83.0  --  83.3  --  84.3 86.9 85.3  PLT 176  --  155  --  171 212 216   < > = values in this interval not displayed.    Recent Labs  Lab 09/04/17 0616 09/04/17 0706 09/04/17 0757 09/04/17 0917 09/06/17 1229  GLUCAP 139* 136* 129* 142* 71       Component  Value Date/Time   IRON 15 (L) 09/04/2017 0913   TIBC 151 (L) 09/04/2017 0913   IRONPCTSAT 10 (L) 09/04/2017 0913    Infusions: . sodium chloride Stopped (09/04/17 0700)  . sodium chloride    . ferric gluconate (FERRLECIT/NULECIT) IV Stopped (09/10/17 1058)   Scheduled Medications: . amiodarone  200 mg Oral BID  . aspirin EC  81 mg Oral Daily  . atorvastatin  40 mg Oral q1800  . bisacodyl  10 mg Oral Daily   Or  . bisacodyl  10 mg Rectal Daily  . Chlorhexidine Gluconate Cloth  6 each Topical Daily  .  darbepoetin (ARANESP) injection - DIALYSIS  100 mcg Intravenous Q Thu-HD  . docusate sodium  200 mg Oral Daily  . feeding supplement (NEPRO CARB STEADY)  237 mL Oral TID BM  . lanthanum  2,000 mg Oral TID WC  . midodrine  15 mg Oral TID WC  . moving right along book   Does not apply Once  . multivitamin  1 tablet Oral QHS  . pantoprazole  40 mg Oral Daily  . sodium chloride flush  3 mL Intravenous Q12H  . warfarin  5 mg Oral q1800  . Warfarin - Physician Dosing Inpatient   Does not apply q1800   Background:  52 year old [name] HTN, CM w/low EF (20-25), CAD, ESRD [facility] (on HD for 17 years!!), secondary HPT, anemia, chronic hypotension, central vein occlusions. S/P single vessel CABG/AoVR 8/41/66 complicated by shock requiring pressors. CRRT via L fem cath 09/02/17 - 5/22. Transitioned to IHD 5/23  Usual HD orders [facility] 4 hours and 45 min TTS  Left AVF  450/800- 2K/2.5 calc  EDW 140.5 new EDW of 140 2000 per hour of heparin Venofer 50 qTues Hect 12 mcg q tx/parsabiv 2.5 q tx  Assessment/Recommendations   1. ESRD - usual TTS [facility].  Required CRRT post CABG/valve replacement d/t hemodynamic instability. Fem vas cath placed 5/18 for CRRT 5/18-5/22, then removed. IHD 5/23 hypotension ^ midodrine to 15 TID. HD well tolerated 5/25, next due on Tuesday (presume outpt). New EDW 140 2. Vascular access - L AVF. Apparently all central vv occluded up  top 3. Hypotension chronic. ^ midodrine 15 TID 4. S/p mechanical AoVR and single vessel (SVG to OM) CABG 5/17 . ASA and warfarin  5. SVT/AF - NSR on amio at present. Some episodes SVT. Per cards  6. Anemia -  Hb <10. No ESA as outpt. TSat low at 10% so 8 doses Ferrlecit ordered as load. Has had 6 of 8 doses, will need 2 more at his unit  Aranesp 100 given 5/23.  Transfused 1 unit PRBC's with HD 5/25 7. Secondary HPT - normally on parsabiv (not available here) and hectorol. Off parsibiv developed some corrected hypercalcemia and since not available here and corrected Ca high did not start hectorol back so as not to make ^ Ca worse. Resumed fosrenol 4 ac, 2-3 snacks at home.  Disposition - for possible discharge to home today. Will FAX records to his HD unit. There will need to be some mechanism in place for his coumadin monitoring in the outpt setting. (I don't know how [facility] manages, but we do not do coumadin monitoring in our units).   Jamal Maes, MD [facility] [phone] Pager 09/10/2017, 2:23 PM

## 2017-09-10 NOTE — Progress Notes (Addendum)
      [address]       [place] [address]             [phone]      9 Days Post-Op Procedure(s) (LRB): AORTIC VALVE REPLACEMENT (AVR) (N/A) CORONARY ARTERY BYPASS GRAFTING (CABG) X 1, USING RIGHT GREATER SAPHENOUS VEIN HARVESTED ENDOSCOPICALLY (N/A) TRANSESOPHAGEAL ECHOCARDIOGRAM (TEE) (N/A) FLOROSCOPY GUIDED PLACEMENT OF RIGHT FEMORAL CENTRAL LINE TIMES TWO  AND PLACEMENT OF SWAN GANZ CATHETER (Right) PLACEMENT OF TRIALYSIS SHORT TERM DIALYSIS CATHETER   Subjective:  [name] has no complaints.  He feels well and wants to go home today.  He denies chest pain, shortness of breath.  + ambulation  + BM  Objective: Vital signs in last 24 hours: Temp:  [97.5 F (36.4 C)-98.7 F (37.1 C)] 98.7 F (37.1 C) (05/26 0300) Pulse Rate:  [87-96] 92 (05/26 0300) Cardiac Rhythm: Normal sinus rhythm;Heart block (05/25 1900) Resp:  [15-30] 16 (05/26 0500) BP: (94-119)/(65-80) 107/78 (05/26 0300) SpO2:  [92 %-100 %] 99 % (05/26 0300) Weight:  [308 lb 1.6 oz (139.8 kg)-309 lb 15.5 oz (140.6 kg)] 308 lb 1.6 oz (139.8 kg) (05/26 0500)  Intake/Output from previous day: 05/25 0701 - 05/26 0700 In: 435 [P.O.:120; Blood:315] Out: 3185   General appearance: alert, cooperative and no distress Heart: regular rate and rhythm and + valve click Lungs: clear to auscultation bilaterally Abdomen: soft, non-tender; bowel sounds normal; no masses,  no organomegaly Extremities: edema tracec Wound: clean and dry  Lab Results: Recent Labs    09/08/17 0303 09/09/17 0330  WBC 8.0 8.6  HGB 8.2* 8.7*  HCT 28.6* 29.6*  PLT 212 216   BMET:  Recent Labs    09/09/17 0330 09/10/17 0314  NA 139 137  K 3.6 3.8  CL 94* 96*  CO2 33* 28  GLUCOSE 72 62*  BUN 27* 17  CREATININE 6.79* 5.24*  CALCIUM 9.0 8.9    PT/INR:  Recent Labs    09/10/17 0314  LABPROT 26.4*  INR 2.45   ABG    Component Value Date/Time   PHART 7.409 09/03/2017 1110   HCO3 23.5 09/03/2017 1110   TCO2 31 09/06/2017 0321   ACIDBASEDEF 1.0 09/03/2017 1110   O2SAT 43.2 09/04/2017 0848   CBG (last 3)  No results for input(s): GLUCAP in the last 72 hours.  Assessment/Plan: S/P Procedure(s) (LRB): AORTIC VALVE REPLACEMENT (AVR) (N/A) CORONARY ARTERY BYPASS GRAFTING (CABG) X 1, USING RIGHT GREATER SAPHENOUS VEIN HARVESTED ENDOSCOPICALLY (N/A) TRANSESOPHAGEAL ECHOCARDIOGRAM (TEE) (N/A) FLOROSCOPY GUIDED PLACEMENT OF RIGHT FEMORAL CENTRAL LINE TIMES TWO  AND PLACEMENT OF SWAN GANZ CATHETER (Right) PLACEMENT OF TRIALYSIS SHORT TERM DIALYSIS CATHETER  1. CV- NSR, continue Amiodarone, continue Midodrine which he took preoperatively 2. Pulm- no acute issues, off oxygen, continue IS 3. Renal- ESRD, HD T/TH/SAT, creatinine at 5.24, K 3.8 4. INR 2.46, patient with mechanical AVR, has been receiving 5 mg daily, there has been concern with current dose as patient is on Amiodarone, will discuss dosage with Dr. Cyndia Bent 5. Dispo- patient stable, will finalize coumadin dose with Dr. Cyndia Bent, plan to d/c home today   LOS: 9 days    Erin Barrett 09/10/2017  Agree with above. He is doing well and should be able to go home today. Would use alternating dose of 5//2.5 mg Coumadin and have INR checked middle of next week.

## 2017-09-12 DIAGNOSIS — N2581 Secondary hyperparathyroidism of renal origin: Secondary | ICD-10-CM | POA: Diagnosis not present

## 2017-09-12 DIAGNOSIS — N186 End stage renal disease: Secondary | ICD-10-CM | POA: Diagnosis not present

## 2017-09-12 DIAGNOSIS — E611 Iron deficiency: Secondary | ICD-10-CM | POA: Diagnosis not present

## 2017-09-12 DIAGNOSIS — Z992 Dependence on renal dialysis: Secondary | ICD-10-CM | POA: Diagnosis not present

## 2017-09-14 ENCOUNTER — Ambulatory Visit (INDEPENDENT_AMBULATORY_CARE_PROVIDER_SITE_OTHER): Payer: Medicare Other | Admitting: *Deleted

## 2017-09-14 ENCOUNTER — Encounter (HOSPITAL_COMMUNITY): Payer: Self-pay | Admitting: Thoracic Surgery (Cardiothoracic Vascular Surgery)

## 2017-09-14 DIAGNOSIS — N186 End stage renal disease: Secondary | ICD-10-CM | POA: Diagnosis not present

## 2017-09-14 DIAGNOSIS — Z992 Dependence on renal dialysis: Secondary | ICD-10-CM | POA: Diagnosis not present

## 2017-09-14 DIAGNOSIS — Z954 Presence of other heart-valve replacement: Secondary | ICD-10-CM | POA: Diagnosis not present

## 2017-09-14 DIAGNOSIS — T82868D Thrombosis of vascular prosthetic devices, implants and grafts, subsequent encounter: Secondary | ICD-10-CM

## 2017-09-14 DIAGNOSIS — N2581 Secondary hyperparathyroidism of renal origin: Secondary | ICD-10-CM | POA: Diagnosis not present

## 2017-09-14 DIAGNOSIS — Z5181 Encounter for therapeutic drug level monitoring: Secondary | ICD-10-CM | POA: Diagnosis not present

## 2017-09-14 DIAGNOSIS — E611 Iron deficiency: Secondary | ICD-10-CM | POA: Diagnosis not present

## 2017-09-14 LAB — POCT INR: INR: 1.5 — AB (ref 2.0–3.0)

## 2017-09-14 LAB — ECHO TEE: AV Mean grad: 6 mmHg

## 2017-09-14 MED ORDER — WARFARIN SODIUM 5 MG PO TABS: ORAL_TABLET | ORAL | 1 refills | Status: DC

## 2017-09-14 NOTE — Patient Instructions (Addendum)
D/C from [facility] 5/26 on coumadin 5mg  daily except 2.5mg  on T,Th,Sat Pt has not had coumadin since d/c. (Due to lack of money)  Wife is going to pick it up today and pt will restart it tonight.  Has not had amiodarone either.  Will restart it as well (2 x day x 7 days then 1 x day) INR 1.5 today Take coumadin 5mg  daily except 2.5mg  on Sunday until INR check on 6/5

## 2017-09-15 ENCOUNTER — Encounter: Payer: Self-pay | Admitting: Thoracic Surgery (Cardiothoracic Vascular Surgery)

## 2017-09-15 ENCOUNTER — Other Ambulatory Visit: Payer: Self-pay

## 2017-09-15 ENCOUNTER — Ambulatory Visit (INDEPENDENT_AMBULATORY_CARE_PROVIDER_SITE_OTHER): Payer: Self-pay | Admitting: Thoracic Surgery (Cardiothoracic Vascular Surgery)

## 2017-09-15 VITALS — BP 89/60 | HR 105 | Resp 18 | Ht 72.0 in | Wt 308.0 lb

## 2017-09-15 DIAGNOSIS — Z992 Dependence on renal dialysis: Secondary | ICD-10-CM | POA: Diagnosis not present

## 2017-09-15 DIAGNOSIS — Z954 Presence of other heart-valve replacement: Secondary | ICD-10-CM

## 2017-09-15 DIAGNOSIS — Z951 Presence of aortocoronary bypass graft: Secondary | ICD-10-CM

## 2017-09-15 DIAGNOSIS — N186 End stage renal disease: Secondary | ICD-10-CM | POA: Diagnosis not present

## 2017-09-15 NOTE — Progress Notes (Signed)
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  Despite the [name] [name]'s severe baseline left ventricular systolic dysfunction his postoperative recovery in the hospital was remarkably uncomplicated.  He did have some episodes of supraventricular tachycardia during the first several days following surgery, but he maintains sinus rhythm on oral amiodarone and was ultimately discharged home on Sep 10, 2017, the ninth postoperative day.    [name] [name] returns to our office today for an unscheduled follow-up visit and wound check.  He states that he had a small amount of drainage from the inferior aspect of his sternal incision and from his chest tube incision sites.  He has not had any fevers or chills.  He has no significant pain at all in the chest.  The scant amount of drainage from the lower part of the sternal incision stopped and has not recurred.  He reports that he is otherwise doing well but he feels tired.  He states that yesterday he was very exhausted after he completed hemodialysis.  Overall he states that he is gradually improving.  He has been up and ambulating and getting around fairly well.  He has not had shortness of breath.  He had his prothrombin time checked and his INR was reportedly down to 1.5, although he admits that he had not been taking warfarin  because he had not gotten his prescription filled yet.  The prescription has now been filled and he has restarted taking it.  Follow-up appointments in the Coumadin clinic have been arranged.   Current Outpatient Medications  Medication Sig Dispense Refill  . amiodarone (PACERONE) 200 MG tablet Take 1 tablet (200 mg total) by mouth 2 (two) times daily. X 7 days, then decrease to 200 mg daily 60 tablet 1  . aspirin EC 81 MG EC tablet Take 1 tablet (81 mg total) by mouth daily.    [name] atorvastatin (LIPITOR) 40 MG tablet Take 1 tablet (40 mg total) by mouth daily at 6 PM. 30 tablet 0  . B Complex-C-Folic Acid (NEPHRO-VITE PO) Take 1 tablet by mouth daily.     . benzonatate (TESSALON) 100 MG capsule 2 po tid for cough 30 capsule 0  . chlorhexidine (PERIDEX) 0.12 % solution Rinse with 15 mls twice daily for 30 seconds. Use after breakfast and at bedtime. Spit out excess. Do not swallow. ([name] [name] taking differently: Use as directed 15 mLs in the mouth or throat 4 (four) times daily. For 30 seconds. Use after breakfast and at bedtime. Spit out excess. Do not swallow.) 960 mL prn  . FOSRENOL 500 MG chewable tablet Chew 1,500-2,000 mg by mouth See admin instructions. Take 2000 mg by mouth with meals and take 1500 mg by mouth with snacks    . midodrine (PROAMATINE) 5 MG tablet Take 3 tablets (15 mg total) by mouth 3 (three) times  daily with meals. 90 tablet 2  . oxyCODONE (OXY IR/ROXICODONE) 5 MG immediate release tablet Take 1 tablet (5 mg total) by mouth every 4 (four) hours as needed for severe pain. 30 tablet 0  . warfarin (COUMADIN) 5 MG tablet Take 1 tablet daily except 1/2 tablet on Tuesdays, Thursdays and Saturdays or as directed by coumadin clinic 90 tablet 1   No current facility-administered medications for this visit.       Physical Exam:   Ht 6' (1.829 m)   BMI 41.79 kg/m   General:  Obese but well-appearing  Chest:   Clear to auscultation  CV:   Regular rate and  rhythm  Incisions:  Clean and dry and healing appropriately.  There is some dry eschar over the chest tube incision sites.  The lower sternal incision is not draining.  There is no surrounding cellulitis.  Abdomen:  Soft nontender  Extremities:  Warm and well-perfused  Diagnostic Tests:  n/a   Impression:  [name] [name]'s surgical incisions appear to be healing appropriately with no sign of wound infection.  Otherwise the [name] [name] seems to be making good progress and recovering uneventfully.  I am concerned by the fact that the [name] [name] did not initially fill his prescription for warfarin immediately at the time of hospital discharge.  He is not taking warfarin and being followed in the Coumadin clinic.  Plan:  I have reiterated to the [name] [name] and his wife how important it is that he take Coumadin on a daily basis.  I have explained them the potentially life-threatening complications that could develop if he does not stay on Coumadin and have it followed regularly.  I have given them specific instructions with regards to wound care and things to be concerned about.  All the questions have been addressed.  They will return to our office on October 09, 2017 for routine postoperative follow-up visit as previously scheduled.  They will call and return sooner should specific problems or questions arise.    Valentina Gu. Roxy Manns, MD 09/15/2017 2:11 PM

## 2017-09-15 NOTE — Patient Instructions (Addendum)
Continue all previous medications without any changes at this time  Keep your incisions clean and dry.  You may cleanse using gentle soap and water.  Apply antibiotic ointment to the small chest tube incisions as desired.

## 2017-09-16 DIAGNOSIS — N186 End stage renal disease: Secondary | ICD-10-CM | POA: Diagnosis not present

## 2017-09-16 DIAGNOSIS — N2581 Secondary hyperparathyroidism of renal origin: Secondary | ICD-10-CM | POA: Diagnosis not present

## 2017-09-16 DIAGNOSIS — Z992 Dependence on renal dialysis: Secondary | ICD-10-CM | POA: Diagnosis not present

## 2017-09-16 DIAGNOSIS — E611 Iron deficiency: Secondary | ICD-10-CM | POA: Diagnosis not present

## 2017-09-19 DIAGNOSIS — E611 Iron deficiency: Secondary | ICD-10-CM | POA: Diagnosis not present

## 2017-09-19 DIAGNOSIS — Z992 Dependence on renal dialysis: Secondary | ICD-10-CM | POA: Diagnosis not present

## 2017-09-19 DIAGNOSIS — N186 End stage renal disease: Secondary | ICD-10-CM | POA: Diagnosis not present

## 2017-09-19 DIAGNOSIS — N2581 Secondary hyperparathyroidism of renal origin: Secondary | ICD-10-CM | POA: Diagnosis not present

## 2017-09-20 ENCOUNTER — Encounter: Payer: Self-pay | Admitting: Cardiovascular Disease

## 2017-09-20 ENCOUNTER — Ambulatory Visit (INDEPENDENT_AMBULATORY_CARE_PROVIDER_SITE_OTHER): Payer: Medicare Other | Admitting: *Deleted

## 2017-09-20 ENCOUNTER — Ambulatory Visit (INDEPENDENT_AMBULATORY_CARE_PROVIDER_SITE_OTHER): Payer: Medicare Other | Admitting: Cardiovascular Disease

## 2017-09-20 VITALS — BP 130/80 | HR 93 | Ht 72.0 in | Wt 315.0 lb

## 2017-09-20 DIAGNOSIS — I480 Paroxysmal atrial fibrillation: Secondary | ICD-10-CM | POA: Diagnosis not present

## 2017-09-20 DIAGNOSIS — I429 Cardiomyopathy, unspecified: Secondary | ICD-10-CM

## 2017-09-20 DIAGNOSIS — I471 Supraventricular tachycardia: Secondary | ICD-10-CM

## 2017-09-20 DIAGNOSIS — I25708 Atherosclerosis of coronary artery bypass graft(s), unspecified, with other forms of angina pectoris: Secondary | ICD-10-CM | POA: Diagnosis not present

## 2017-09-20 DIAGNOSIS — Z954 Presence of other heart-valve replacement: Secondary | ICD-10-CM | POA: Diagnosis not present

## 2017-09-20 DIAGNOSIS — I5022 Chronic systolic (congestive) heart failure: Secondary | ICD-10-CM | POA: Diagnosis not present

## 2017-09-20 DIAGNOSIS — I38 Endocarditis, valve unspecified: Secondary | ICD-10-CM

## 2017-09-20 DIAGNOSIS — Z5181 Encounter for therapeutic drug level monitoring: Secondary | ICD-10-CM

## 2017-09-20 DIAGNOSIS — I2510 Atherosclerotic heart disease of native coronary artery without angina pectoris: Secondary | ICD-10-CM

## 2017-09-20 DIAGNOSIS — N186 End stage renal disease: Secondary | ICD-10-CM | POA: Diagnosis not present

## 2017-09-20 DIAGNOSIS — T82868D Thrombosis of vascular prosthetic devices, implants and grafts, subsequent encounter: Secondary | ICD-10-CM | POA: Diagnosis not present

## 2017-09-20 DIAGNOSIS — Z951 Presence of aortocoronary bypass graft: Secondary | ICD-10-CM

## 2017-09-20 DIAGNOSIS — Z992 Dependence on renal dialysis: Secondary | ICD-10-CM | POA: Diagnosis not present

## 2017-09-20 LAB — POCT INR: INR: 1.6 — AB (ref 2.0–3.0)

## 2017-09-20 NOTE — Patient Instructions (Addendum)
Take coumadin 1 1/2 tablets tonight then start 1 tablet daily On Amiodarone 200 mg 2 x day thru 6/6 then decrease to 1 tablet daily on 6/7 Recheck on 6/11

## 2017-09-20 NOTE — Progress Notes (Signed)
SUBJECTIVE: The patient presents for routine follow-up.  He underwent mechanical bileaflet aortic valve replacement and single-vessel CABG on 09/01/2017.  He developed postoperative SVT and was started on amiodarone drip.  He then went into atrial fibrillation.  He ultimately converted to sinus rhythm.  He saw CT surgery in the office on 09/15/2017 and was not taking warfarin.  The importance of taking warfarin was explained to him at that time.  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  When Dr. Aundra Dubin evaluated him on 09/08/2017, he did not feel that pure aortic sufficiency could be the only cause for his cardiomyopathy as the left ventricle was not markedly dilated.  While he has had chronic sinus tachycardia for years, heart rate had been in the 100-110 bpm range and this is unlikely to cause a tachycardia mediated cardiomyopathy.  While he had 80% stenoses in the left circumflex, this is also unlikely to be the sole cause.  He was hypotensive and given midodrine.  He does have end-stage renal disease and is on hemodialysis.  He is now doing very well.  After surgery he has had some mild chest soreness but denies exertional chest pain.  He also denies exertional dyspnea and paroxysmal nocturnal dyspnea.  He does have 2 pillow orthopnea as he used to sleep with one pillow prior to his initial cardiac evaluation.  He initially ambulated with the use of a walker for about 2 days while at home but has since not require this.  He does some walking but not any extensive walking.  He denies palpitations.    Review of Systems: As per "subjective", otherwise negative.  No Known Allergies  Current Outpatient Medications  Medication Sig Dispense Refill  . amiodarone (PACERONE) 200 MG tablet Take 1 tablet (200 mg total) by mouth 2 (two) times daily. X 7 days, then decrease to 200 mg daily 60 tablet 1  . aspirin EC 81 MG EC tablet Take 1 tablet (81 mg total) by mouth daily.    [name]  atorvastatin (LIPITOR) 40 MG tablet Take 1 tablet (40 mg total) by mouth daily at 6 PM. 30 tablet 0  . B Complex-C-Folic Acid (NEPHRO-VITE PO) Take 1 tablet by mouth daily.     . benzonatate (TESSALON) 100 MG capsule 2 po tid for cough 30 capsule 0  . chlorhexidine (PERIDEX) 0.12 % solution Rinse with 15 mls twice daily for 30 seconds. Use after breakfast and at bedtime. Spit out excess. Do not swallow. (Patient taking differently: Use as directed 15 mLs in the mouth or throat 4 (four) times daily. For 30 seconds. Use after breakfast and at bedtime. Spit out excess. Do not swallow.) 960 mL prn  . FOSRENOL 500 MG chewable tablet Chew 1,500-2,000 mg by mouth See admin instructions. Take 2000 mg by mouth with meals and take 1500 mg by mouth with snacks    . midodrine (PROAMATINE) 5 MG tablet Take 3 tablets (15 mg total) by mouth 3 (three) times daily with meals. 90 tablet 2  . oxyCODONE (OXY IR/ROXICODONE) 5 MG immediate release tablet Take 1 tablet (5 mg total) by mouth every 4 (four) hours as needed for severe pain. 30 tablet 0  . warfarin (COUMADIN) 5 MG tablet Take 1 tablet daily except 1/2 tablet on Tuesdays, Thursdays and Saturdays or as directed by coumadin clinic (Patient taking differently: 5 mg. Take 1 tablet daily except 1/2 tablet on Tuesdays, Thursdays and Saturdays or as directed by coumadin clinic) 90 tablet  1   No current facility-administered medications for this visit.     Past Medical History:  Diagnosis Date  . Aneurysm ([employer])     Right arm fistula 3 aneurysms 2011,   plans to have a new procedure  . Angina   . Chest discomfort   . CHF (congestive heart failure) ([place])   . Coronary artery disease   . Coronary artery disease involving native coronary artery of native heart without angina pectoris   . Dialysis patient ([place])   . Dysrhythmia   . ESRD (end stage renal disease) ([place])    on hemodialysis T_T_S  . Hypertension   . Leg pain   . Mitral regurgitation   . Morbid obesity  ([place])   . Orthostatic hypotension   . Overweight(278.02)   . 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 Severe aortic insufficiency   . Sinus tachycardia   . Syncope     positional after dialysis... 2008    Past Surgical History:  Procedure Laterality Date  . A/V FISTULAGRAM Left 06/30/2016   Procedure: A/V Fistulagram;  Surgeon: Algernon Huxley, MD;  Location: [place] [facility];  Service: Cardiovascular;  Laterality: Left;  . AORTIC VALVE REPLACEMENT N/A 09/01/2017   Procedure: AORTIC VALVE REPLACEMENT (AVR);  Surgeon: [name], MD;  Location: [place];  Service: Open Heart Surgery;  Laterality: N/A;  . ARTERIOVENOUS GRAFT PLACEMENT    . AV FISTULA PLACEMENT    . AV FISTULA PLACEMENT Left 11/18/2015   Procedure: ARTERIOVENOUS (AV) FISTULA CREATION ( RADIOCEPHALIC );  Surgeon: Algernon Huxley, MD;  Location: [facility];  Service: Vascular;  Laterality: Left;  . AV FISTULA PLACEMENT Left 03/23/2016   Procedure: ARTERIOVENOUS (AV) FISTULA CREATION ( REVISION );  Surgeon: Algernon Huxley, MD;  Location: [facility];  Service: Vascular;  Laterality: Left;  . [place] REMOVAL Left 03/23/2016   Procedure: REMOVAL OF ARTERIOVENOUS GORETEX GRAFT ([place]);  Surgeon: Algernon Huxley, MD;  Location: [facility];  Service: Vascular;  Laterality: Left;  . CENTRAL LINE INSERTION Right 09/01/2017   Procedure: FLOROSCOPY GUIDED PLACEMENT OF RIGHT FEMORAL CENTRAL LINE TIMES TWO  AND PLACEMENT OF SWAN GANZ CATHETER;  Surgeon: [name], MD;  Location: [place];  Service: Open Heart Surgery;  Laterality: Right;  . CORONARY ARTERY BYPASS GRAFT N/A 09/01/2017   Procedure: CORONARY ARTERY BYPASS GRAFTING (CABG) X 1, USING RIGHT GREATER SAPHENOUS VEIN HARVESTED ENDOSCOPICALLY;  Surgeon: [name], MD;  Location: [place];  Service: Open Heart Surgery;  Laterality: N/A;  . DIALYSIS/PERMA CATHETER  REMOVAL N/A 08/01/2016   Procedure: Dialysis/Perma Catheter Removal;  Surgeon: Algernon Huxley, MD;  Location: [place] [facility];  Service: Cardiovascular;  Laterality: N/A;  . HERNIA REPAIR    . INSERTION OF DIALYSIS CATHETER  03/01/2011   Procedure: INSERTION OF DIALYSIS CATHETER;  Surgeon: Elam Dutch, MD;  Location: [place];  Service: Vascular;  Laterality: Left;  Exchange of Dialysis Catheter to 27cm 15Fr. Arrow Catheter  . INSERTION OF DIALYSIS CATHETER  09/01/2017   Procedure: PLACEMENT OF TRIALYSIS SHORT TERM DIALYSIS CATHETER;  Surgeon: [name], MD;  Location: [place];  Service: Open Heart Surgery;;  . MULTIPLE EXTRACTIONS WITH ALVEOLOPLASTY N/A 08/07/2017   Procedure: Extraction of tooth #2, 7,8,13,14,15,17, and 31 with alveoloplasty and gross debridement of remaining teeth;  Surgeon: [name], DDS;  Location: [place];  Service: Oral Surgery;  Laterality: N/A;  . PERIPHERAL VASCULAR CATHETERIZATION N/A 09/01/2014   Procedure: A/V Shuntogram/Fistulagram;  Surgeon: Algernon Huxley, MD;  Location: [place] [facility];  Service: Cardiovascular;  Laterality: N/A;  . PERIPHERAL VASCULAR CATHETERIZATION Right 09/01/2014   Procedure: Thrombectomy;  Surgeon: Algernon Huxley, MD;  Location: [place] [facility];  Service: Cardiovascular;  Laterality: Right;  . PERIPHERAL VASCULAR CATHETERIZATION Right 09/01/2014   Procedure: A/V Shunt Intervention;  Surgeon: Algernon Huxley, MD;  Location: [place] [facility];  Service: Cardiovascular;  Laterality: Right;  . PERIPHERAL VASCULAR CATHETERIZATION N/A 09/17/2014   Procedure: A/V Shuntogram/Fistulagram;  Surgeon: Algernon Huxley, MD;  Location: [place] [facility];  Service: Cardiovascular;  Laterality: N/A;  . PERIPHERAL VASCULAR CATHETERIZATION Right 10/17/2014   Procedure: Thrombectomy;  Surgeon: Katha Cabal, MD;  Location: [place] [facility];  Service: Cardiovascular;  Laterality: Right;  . PERIPHERAL VASCULAR CATHETERIZATION N/A 10/17/2014    Procedure: A/V Shuntogram/Fistulagram;  Surgeon: Katha Cabal, MD;  Location: [place] [facility];  Service: Cardiovascular;  Laterality: N/A;  . PERIPHERAL VASCULAR CATHETERIZATION N/A 10/17/2014   Procedure: A/V Shunt Intervention;  Surgeon: Katha Cabal, MD;  Location: [place] [facility];  Service: Cardiovascular;  Laterality: N/A;  . PERIPHERAL VASCULAR CATHETERIZATION Right 11/04/2014   Procedure: Thrombectomy;  Surgeon: Katha Cabal, MD;  Location: [place] [facility];  Service: Cardiovascular;  Laterality: Right;  . PERIPHERAL VASCULAR CATHETERIZATION Left 11/04/2014   Procedure: Visceral Venography;  Surgeon: Katha Cabal, MD;  Location: [place] [facility];  Service: Cardiovascular;  Laterality: Left;  . PERIPHERAL VASCULAR CATHETERIZATION Right 11/27/2014   Procedure: A/V Shuntogram/Fistulagram;  Surgeon: Algernon Huxley, MD;  Location: [place] [facility];  Service: Cardiovascular;  Laterality: Right;  . PERIPHERAL VASCULAR CATHETERIZATION Right 11/27/2014   Procedure: A/V Shunt Intervention;  Surgeon: Algernon Huxley, MD;  Location: [place] [facility];  Service: Cardiovascular;  Laterality: Right;  . PERIPHERAL VASCULAR CATHETERIZATION Right 12/31/2014   Procedure: Thrombectomy;  Surgeon: Algernon Huxley, MD;  Location: [place] [facility];  Service: Cardiovascular;  Laterality: Right;  . PERIPHERAL VASCULAR CATHETERIZATION Right 01/12/2015   Procedure: A/V Shuntogram/Fistulagram;  Surgeon: Algernon Huxley, MD;  Location: [place] [facility];  Service: Cardiovascular;  Laterality: Right;  . PERIPHERAL VASCULAR CATHETERIZATION N/A 01/12/2015   Procedure: A/V Shunt Intervention;  Surgeon: Algernon Huxley, MD;  Location: [place] [facility];  Service: Cardiovascular;  Laterality: N/A;  . PERIPHERAL VASCULAR CATHETERIZATION Right 01/28/2015   Procedure: Thrombectomy;  Surgeon: Algernon Huxley, MD;  Location: [place] [facility];  Service: Cardiovascular;  Laterality: Right;  . PERIPHERAL  VASCULAR CATHETERIZATION N/A 06/08/2015   Procedure: A/V Shuntogram/Fistulagram;  Surgeon: Algernon Huxley, MD;  Location: [place] [facility];  Service: Cardiovascular;  Laterality: N/A;  . PERIPHERAL VASCULAR CATHETERIZATION N/A 06/08/2015   Procedure: A/V Shunt Intervention;  Surgeon: Algernon Huxley, MD;  Location: [place] [facility];  Service: Cardiovascular;  Laterality: N/A;  . PERIPHERAL VASCULAR CATHETERIZATION Right 07/06/2015   Procedure: A/V Shuntogram/Fistulagram;  Surgeon: Algernon Huxley, MD;  Location: [place] [facility];  Service: Cardiovascular;  Laterality: Right;  . PERIPHERAL VASCULAR CATHETERIZATION N/A 07/06/2015   Procedure: A/V Shunt Intervention;  Surgeon: Algernon Huxley, MD;  Location: [place] [facility];  Service: Cardiovascular;  Laterality: N/A;  . PERIPHERAL VASCULAR CATHETERIZATION N/A 08/04/2015   Procedure: graft  declot;  Surgeon: Katha Cabal, MD;  Location: [place] [facility];  Service: Cardiovascular;  Laterality: N/A;  . PERIPHERAL VASCULAR CATHETERIZATION Right 08/25/2015   Procedure: A/V Shuntogram/Fistulagram;  Surgeon: Katha Cabal, MD;  Location: [place] [facility];  Service: Cardiovascular;  Laterality: Right;  . PERIPHERAL VASCULAR CATHETERIZATION N/A 11/04/2015   Procedure: Dialysis/Perma Catheter Insertion;  Surgeon: Algernon Huxley, MD;  Location: [place] [facility];  Service: Cardiovascular;  Laterality: N/A;  . PERIPHERAL VASCULAR CATHETERIZATION Left 12/14/2015   Procedure: A/V Shuntogram/Fistulagram;  Surgeon: Algernon Huxley, MD;  Location: [place] [facility];  Service: Cardiovascular;  Laterality: Left;  . PERIPHERAL VASCULAR CATHETERIZATION N/A 12/14/2015   Procedure: A/V Shunt Intervention;  Surgeon: Algernon Huxley, MD;  Location: [place] [facility];  Service: Cardiovascular;  Laterality: N/A;  . PERIPHERAL VASCULAR CATHETERIZATION N/A 12/17/2015   Procedure: Dialysis/Perma Catheter Insertion;  Surgeon: Algernon Huxley, MD;  Location: [place] [facility];   Service: Cardiovascular;  Laterality: N/A;  . PERIPHERAL VASCULAR CATHETERIZATION Left 12/22/2015   Procedure: Dialysis/Perma Catheter Insertion;  Surgeon: Katha Cabal, MD;  Location: [place] [facility];  Service: Cardiovascular;  Laterality: Left;  . PERIPHERAL VASCULAR CATHETERIZATION Left 02/29/2016   Procedure: A/V Shuntogram/Fistulagram;  Surgeon: Algernon Huxley, MD;  Location: [place] [facility];  Service: Cardiovascular;  Laterality: Left;  . PERIPHERAL VASCULAR CATHETERIZATION N/A 02/29/2016   Procedure: A/V Shunt Intervention;  Surgeon: Algernon Huxley, MD;  Location: [place] [facility];  Service: Cardiovascular;  Laterality: N/A;  . right arm graft     for dyalisis  . RIGHT/LEFT HEART CATH AND CORONARY ANGIOGRAPHY N/A 08/02/2017   Procedure: RIGHT/LEFT HEART CATH AND CORONARY ANGIOGRAPHY;  Surgeon: Larey Dresser, MD;  Location: [place] [facility];  Service: Cardiovascular;  Laterality: N/A;  . TEE WITHOUT CARDIOVERSION N/A 07/31/2017   Procedure: TRANSESOPHAGEAL ECHOCARDIOGRAM (TEE);  Surgeon: Josue Hector, MD;  Location[facility];  Service: Cardiovascular;  Laterality: N/A;  . TEE WITHOUT CARDIOVERSION N/A 09/01/2017   Procedure: TRANSESOPHAGEAL ECHOCARDIOGRAM (TEE);  Surgeon: [name], MD;  Location: [place];  Service: Open Heart Surgery;  Laterality: N/A;  . THROMBECTOMY    . THROMBECTOMY W/ EMBOLECTOMY  03/01/2011   Procedure: THROMBECTOMY ARTERIOVENOUS GORE-TEX GRAFT;  Surgeon: Elam Dutch, MD;  Location: [place];  Service: Vascular;  Laterality: Right;  Attempted Thrombectomy of Old  Right Upper Arm Arteriovenous gortex Graft. Insertion of new Arteriovenous Graft using 105mm x 50cm Gortex Stretch graft.   . THROMBECTOMY W/ EMBOLECTOMY  07/11/2011   Procedure: THROMBECTOMY ARTERIOVENOUS GORE-TEX GRAFT;  Surgeon: Rosetta Posner, MD;  Location: [place];  Service: Vascular;  Laterality: Right;  . UMBILICAL HERNIA REPAIR    . VENOGRAM N/A 08/23/2011   Procedure: VENOGRAM;   Surgeon: [name], MD;  Location: [facility];  Service: Cardiovascular;  Laterality: N/A;    Social History   Socioeconomic History  . Marital status: Married    Spouse name: Not on file  . Number of children: Not on file  . Years of education: Not on file  . Highest education level: Not on file  Occupational History  . Occupation: disabled  Social Needs  . Financial resource strain: Not on file  . Food insecurity:    Worry: Not on file    Inability: Not on file  . Transportation needs:    Medical: Not on file    Non-medical: Not on file  Tobacco Use  .  Smoking status: Former Smoker    Types: Cigarettes    Last attempt to quit: 04/18/1994    Years since quitting: 23.4  . Smokeless tobacco: Current User    Types: Snuff  . Tobacco comment: dips 1/2 snuff per day times 25 years  Substance and Sexual Activity  . Alcohol use: No  . Drug use: No  . Sexual activity: Not on file  Lifestyle  . Physical activity:    Days per week: Not on file    Minutes per session: Not on file  . Stress: Not on file  Relationships  . Social connections:    Talks on phone: Not on file    Gets together: Not on file    Attends religious service: Not on file    Active member of club or organization: Not on file    Attends meetings of clubs or organizations: Not on file    Relationship status: Not on file  . Intimate partner violence:    Fear of current or ex partner: Not on file    Emotionally abused: Not on file    Physically abused: Not on file    Forced sexual activity: Not on file  Other Topics Concern  . Not on file  Social History Narrative  . Not on file     Vitals:   09/20/17 1343  BP: 130/80  Pulse: 93  SpO2: (!) 89%  Weight: (!) 315 lb (142.9 kg)  Height: 6' (1.829 m)    Wt Readings from Last 3 Encounters:  09/20/17 (!) 315 lb (142.9 kg)  09/15/17 (!) 308 lb (139.7 kg)  09/10/17 (!) 308 lb 1.6 oz (139.8 kg)     PHYSICAL EXAM General: NAD HEENT:  Normal. Neck: No JVD, no thyromegaly. Lungs: Clear to auscultation bilaterally with normal respiratory effort. CV: Regular rate and rhythm, normal Y7/X4 click, no J2/I7, no murmur. No pretibial or periankle edema.  No carotid bruit.   Abdomen: Soft, nontender, obese.  Neurologic: Alert and oriented.  Psych: Normal affect. Skin: Normal. Musculoskeletal: No gross deformities.    ECG: Most recent ECG reviewed.   Labs: Lab Results  Component Value Date/Time   K 3.8 09/10/2017 03:14 AM   K 4.1 11/13/2013 04:08 AM   BUN 17 09/10/2017 03:14 AM   BUN 68 (H) 11/13/2013 04:08 AM   CREATININE 5.24 (H) 09/10/2017 03:14 AM   CREATININE 16.45 (H) 11/13/2013 04:08 AM   ALT 10 (L) 09/05/2017 03:00 AM   ALT < 6 (L) 05/16/2013 06:16 AM   TSH 1.670 09/03/2017 02:36 AM   HGB 8.7 (L) 09/09/2017 03:30 AM   HGB 11.3 (L) 11/13/2013 04:08 AM     Lipids: Lab Results  Component Value Date/Time   LDLCALC 115 (H) 05/16/2013 06:16 AM   CHOL 169 05/16/2013 06:16 AM   TRIG 97 05/16/2013 06:16 AM   HDL 35 (L) 05/16/2013 06:16 AM       ASSESSMENT AND PLAN: 1.  Chronic systolic heart failure/cardiomyopathy: Symptomatically stable.  Multifactorial cardiomyopathy as detailed above.  Volume management as per hemodialysis.  He is scheduled for a follow-up echocardiogram on July 10 and an appointment with Dr. Aundra Dubin that same day.   2.  Coronary artery disease status post one-vessel CABG: Symptomatically stable.  Continue aspirin and statin.  I will make a referral to cardiac rehabilitation.  3.  Paroxysmal atrial fibrillation: Currently on oral amiodarone and systemically anticoagulated with warfarin.  4.  Status post mechanical bileaflet aortic valve replacement: Currently on warfarin.  The importance of daily adherence was again emphasized.  5.  End-stage renal disease on hemodialysis.  As he becomes hypotensive, he is currently on midodrine.  6.  Paroxysmal supraventricular tachycardia: He is on  amiodarone.  I will make an EP referral to consider an ablation as this appears to be recurrent and rapid.  7.  Valvular heart disease: TEE on 09/01/2017 demonstrated severe aortic valve regurgitation for which he underwent aortic valve replacement.  He also had moderate leaflet thickening and calcification with mild mitral annular calcification and moderate mitral regurgitation. He is scheduled for a follow-up echocardiogram on July 10.   Disposition: Follow up with Dr. Aundra Dubin in July.  Follow-up with me in October 2019.  I am making an EP referral.  Time spent: 40 minutes, of which greater than 50% was spent reviewing symptoms, relevant blood tests and studies, and discussing management plan with the patient.     Kate Sable, M.D., F.A.C.C.

## 2017-09-20 NOTE — Patient Instructions (Signed)
Medication Instructions:  Your physician recommends that you continue on your current medications as directed. Please refer to the Current Medication list given to you today.   Labwork: none  Testing/Procedures: none  Follow-Up: Your physician recommends that you schedule a follow-up appointment in: October with Dr. Bronson Ing    Any Other Special Instructions Will Be Listed Below (If Applicable).  You have been referred to cardiac rehab.   You have been referred to cardiac electrophysiology.      If you need a refill on your cardiac medications before your next appointment, please call your pharmacy.

## 2017-09-21 DIAGNOSIS — N2581 Secondary hyperparathyroidism of renal origin: Secondary | ICD-10-CM | POA: Diagnosis not present

## 2017-09-21 DIAGNOSIS — Z992 Dependence on renal dialysis: Secondary | ICD-10-CM | POA: Diagnosis not present

## 2017-09-21 DIAGNOSIS — E611 Iron deficiency: Secondary | ICD-10-CM | POA: Diagnosis not present

## 2017-09-21 DIAGNOSIS — N186 End stage renal disease: Secondary | ICD-10-CM | POA: Diagnosis not present

## 2017-09-23 ENCOUNTER — Encounter (HOSPITAL_COMMUNITY): Payer: Self-pay | Admitting: Emergency Medicine

## 2017-09-23 ENCOUNTER — Emergency Department (HOSPITAL_COMMUNITY): Payer: Medicare Other

## 2017-09-23 ENCOUNTER — Emergency Department (HOSPITAL_COMMUNITY)
Admission: EM | Admit: 2017-09-23 | Discharge: 2017-09-23 | Disposition: A | Discharge status: Home / Self Care | Payer: Medicare Other | Attending: Emergency Medicine | Admitting: Emergency Medicine

## 2017-09-23 ENCOUNTER — Other Ambulatory Visit: Payer: Self-pay

## 2017-09-23 DIAGNOSIS — E611 Iron deficiency: Secondary | ICD-10-CM | POA: Diagnosis not present

## 2017-09-23 DIAGNOSIS — Z87891 Personal history of nicotine dependence: Secondary | ICD-10-CM | POA: Diagnosis not present

## 2017-09-23 DIAGNOSIS — Z951 Presence of aortocoronary bypass graft: Secondary | ICD-10-CM | POA: Diagnosis not present

## 2017-09-23 DIAGNOSIS — N179 Acute kidney failure, unspecified: Secondary | ICD-10-CM | POA: Diagnosis not present

## 2017-09-23 DIAGNOSIS — Z992 Dependence on renal dialysis: Secondary | ICD-10-CM | POA: Insufficient documentation

## 2017-09-23 DIAGNOSIS — I2510 Atherosclerotic heart disease of native coronary artery without angina pectoris: Secondary | ICD-10-CM | POA: Insufficient documentation

## 2017-09-23 DIAGNOSIS — I132 Hypertensive heart and chronic kidney disease with heart failure and with stage 5 chronic kidney disease, or end stage renal disease: Secondary | ICD-10-CM | POA: Insufficient documentation

## 2017-09-23 DIAGNOSIS — R079 Chest pain, unspecified: Secondary | ICD-10-CM | POA: Diagnosis not present

## 2017-09-23 DIAGNOSIS — R000 Tachycardia, unspecified: Secondary | ICD-10-CM | POA: Diagnosis not present

## 2017-09-23 DIAGNOSIS — I5042 Chronic combined systolic (congestive) and diastolic (congestive) heart failure: Secondary | ICD-10-CM | POA: Insufficient documentation

## 2017-09-23 DIAGNOSIS — Z7982 Long term (current) use of aspirin: Secondary | ICD-10-CM | POA: Diagnosis not present

## 2017-09-23 DIAGNOSIS — N2581 Secondary hyperparathyroidism of renal origin: Secondary | ICD-10-CM | POA: Diagnosis not present

## 2017-09-23 DIAGNOSIS — N186 End stage renal disease: Secondary | ICD-10-CM | POA: Diagnosis not present

## 2017-09-23 DIAGNOSIS — Z7901 Long term (current) use of anticoagulants: Secondary | ICD-10-CM | POA: Insufficient documentation

## 2017-09-23 HISTORY — DX: Disorder of kidney and ureter, unspecified: N28.9

## 2017-09-23 LAB — CBC WITH DIFFERENTIAL/PLATELET
Basophils Absolute: 0 10*3/uL (ref 0.0–0.1)
Basophils Relative: 0 %
EOS ABS: 0.3 10*3/uL (ref 0.0–0.7)
Eosinophils Relative: 4 %
HCT: 35 % — ABNORMAL LOW (ref 39.0–52.0)
HEMOGLOBIN: 10.5 g/dL — AB (ref 13.0–17.0)
LYMPHS PCT: 13 %
Lymphs Abs: 1.1 10*3/uL (ref 0.7–4.0)
MCH: 24.9 pg — AB (ref 26.0–34.0)
MCHC: 30 g/dL (ref 30.0–36.0)
MCV: 83.1 fL (ref 78.0–100.0)
Monocytes Absolute: 0.5 10*3/uL (ref 0.1–1.0)
Monocytes Relative: 6 %
NEUTROS PCT: 77 %
Neutro Abs: 6.3 10*3/uL (ref 1.7–7.7)
Platelets: 383 10*3/uL (ref 150–400)
RBC: 4.21 MIL/uL — AB (ref 4.22–5.81)
RDW: 16.1 % — ABNORMAL HIGH (ref 11.5–15.5)
WBC: 8.3 10*3/uL (ref 4.0–10.5)

## 2017-09-23 LAB — BASIC METABOLIC PANEL
ANION GAP: 13 (ref 5–15)
BUN: 31 mg/dL — AB (ref 6–20)
CHLORIDE: 97 mmol/L — AB (ref 101–111)
CO2: 28 mmol/L (ref 22–32)
Calcium: 9.3 mg/dL (ref 8.9–10.3)
Creatinine, Ser: 6.45 mg/dL — ABNORMAL HIGH (ref 0.61–1.24)
GFR calc Af Amer: 10 mL/min — ABNORMAL LOW (ref >60)
GFR calc non Af Amer: 9 mL/min — ABNORMAL LOW (ref >60)
Glucose, Bld: 87 mg/dL (ref 65–99)
POTASSIUM: 3.2 mmol/L — AB (ref 3.5–5.1)
SODIUM: 138 mmol/L (ref 135–145)

## 2017-09-23 LAB — PROTIME-INR
INR: 1.92
Prothrombin Time: 21.8 seconds — ABNORMAL HIGH (ref 11.4–15.2)

## 2017-09-23 LAB — TROPONIN I: Troponin I: 0.03 ng/mL (ref <0.03)

## 2017-09-23 LAB — I-STAT TROPONIN, ED: TROPONIN I, POC: 0.04 ng/mL (ref 0.00–0.08)

## 2017-09-23 MED ORDER — TECHNETIUM TC 99M DIETHYLENETRIAME-PENTAACETIC ACID
30.0000 | Freq: Once | INTRAVENOUS | Status: AC | PRN
  Administered 2017-09-23: 33 via RESPIRATORY_TRACT

## 2017-09-23 MED ORDER — TECHNETIUM TO 99M ALBUMIN AGGREGATED
4.0000 | Freq: Once | INTRAVENOUS | Status: AC | PRN
  Administered 2017-09-23: 4.4 via INTRAVENOUS

## 2017-09-23 NOTE — Discharge Instructions (Addendum)
Tests showed no life threatening condition.  Follow up with your regular dr.

## 2017-09-23 NOTE — ED Notes (Signed)
Date and time results received: 09/23/17 1100 (use smartphrase ".now" to insert current time)  Test: Troponin Critical Value: 0.03  Name of Provider Notified: BC  Orders Received? Or Actions Taken?: none at this time

## 2017-09-23 NOTE — ED Triage Notes (Signed)
Pt is S/P CABG may 17th at [facility].  Surgeon is Dr Roxy Manns and [facility] With Dr  Bronson Ing.

## 2017-09-23 NOTE — ED Triage Notes (Signed)
Was at dialysis this am and was told by staff that hr was racing.  Denies any pain or SOB.

## 2017-09-24 NOTE — ED Provider Notes (Signed)
[facility] EMERGENCY DEPARTMENT Provider Note   CSN: [id number] Arrival date & time: 09/23/17  0900     History   Chief Complaint Chief Complaint  Patient presents with  . Tachycardia    HPI [name] [name] is a 52 y.o. male.  Level 5 caveat for urgent need for intervention.  Patient presents with a tachycardia noted at dialysis this morning.  He has multiple health problems including end-stage renal disease with dialysis on Tuesday Thursday Saturday.  He is status post aortic valve replacement and CABG x1.  No chest pain, dyspnea, diaphoresis, nausea.  Incidentally, he feels normal.     Past Medical History:  Diagnosis Date  . Aneurysm ([place])     Right arm fistula 3 aneurysms 2011,   plans to have a new procedure  . Angina   . Chest discomfort   . CHF (congestive heart failure) ([place])   . Coronary artery disease   . Coronary artery disease involving native coronary artery of native heart without angina pectoris   . Dialysis patient ([place])   . Dysrhythmia   . ESRD (end stage renal disease) ([place])    on hemodialysis [id number]  . Hypertension   . Leg pain   . Mitral regurgitation   . Morbid obesity ([place])   . Orthostatic hypotension   . Overweight(278.02)   . Renal insufficiency   . 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 Severe aortic insufficiency   . Sinus tachycardia   . Syncope     positional after dialysis... 2008    Patient Active Problem List   Diagnosis Date Noted  . Encounter for therapeutic drug monitoring 09/14/2017  . S/P aortic valve replacement with bileaflet mechanical valve 09/01/2017  . S/P CABG x 1 09/01/2017  . Coronary artery disease involving native coronary artery of native heart without angina pectoris   . 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 Abnormal echocardiogram   . Mitral regurgitation   . Severe aortic  insufficiency   . Acute on chronic combined systolic and diastolic heart failure ([place])   . Systolic CHF ([place]) 97/67/3419  . 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 Dermatitis 04/06/2016  . End stage renal disease ([place]) 12/22/2011  . Sinus tachycardia   . ESRD (end stage renal disease) ([place])   . Orthostatic hypotension   . Chest discomfort   . Morbid obesity ([place])   . Syncope   . Aneurysm [facility])     Past Surgical History:  Procedure Laterality Date  . A/V FISTULAGRAM Left 06/30/2016   Procedure: A/V Fistulagram;  Surgeon: Algernon Huxley, MD;  Location: [place] CV LAB;  Service: Cardiovascular;  Laterality: Left;  . AORTIC VALVE REPLACEMENT N/A 09/01/2017   Procedure: AORTIC VALVE REPLACEMENT (AVR);  Surgeon: [name], MD;  Location: [place];  Service: Open Heart Surgery;  Laterality: N/A;  . ARTERIOVENOUS GRAFT PLACEMENT    . AV FISTULA PLACEMENT    . AV FISTULA PLACEMENT Left 11/18/2015   Procedure: ARTERIOVENOUS (AV) FISTULA CREATION ( RADIOCEPHALIC );  Surgeon: Algernon Huxley, MD;  Location: [facility];  Service: Vascular;  Laterality: Left;  . AV FISTULA PLACEMENT Left 03/23/2016   Procedure: ARTERIOVENOUS (AV) FISTULA CREATION ( REVISION );  Surgeon: Algernon Huxley, MD;  Location: [facility];  Service: Vascular;  Laterality: Left;  . AVGG REMOVAL  Left 03/23/2016   Procedure: REMOVAL OF ARTERIOVENOUS GORETEX GRAFT ([place]);  Surgeon: Algernon Huxley, MD;  Location: [facility];  Service: Vascular;  Laterality: Left;  . CENTRAL LINE INSERTION Right 09/01/2017   Procedure: FLOROSCOPY GUIDED PLACEMENT OF RIGHT FEMORAL CENTRAL LINE TIMES TWO  AND PLACEMENT OF SWAN GANZ CATHETER;  Surgeon: [name], MD;  Location: [place];  Service: Open Heart Surgery;  Laterality: Right;  . CORONARY ARTERY BYPASS GRAFT N/A 09/01/2017   Procedure: CORONARY ARTERY BYPASS GRAFTING (CABG) X 1, USING RIGHT GREATER SAPHENOUS VEIN HARVESTED ENDOSCOPICALLY;  Surgeon: [name], MD;  Location: [place];   Service: Open Heart Surgery;  Laterality: N/A;  . DIALYSIS/PERMA CATHETER REMOVAL N/A 08/01/2016   Procedure: Dialysis/Perma Catheter Removal;  Surgeon: Algernon Huxley, MD;  Location: [place] [facility];  Service: Cardiovascular;  Laterality: N/A;  . HERNIA REPAIR    . INSERTION OF DIALYSIS CATHETER  03/01/2011   Procedure: INSERTION OF DIALYSIS CATHETER;  Surgeon: Elam Dutch, MD;  Location: [place];  Service: Vascular;  Laterality: Left;  Exchange of Dialysis Catheter to 27cm 15Fr. Arrow Catheter  . INSERTION OF DIALYSIS CATHETER  09/01/2017   Procedure: PLACEMENT OF TRIALYSIS SHORT TERM DIALYSIS CATHETER;  Surgeon: [name], MD;  Location: [place] [facility];  Service: Open Heart Surgery;;  . MULTIPLE EXTRACTIONS WITH ALVEOLOPLASTY N/A 08/07/2017   Procedure: Extraction of tooth #2, 7,8,13,14,15,17, and 31 with alveoloplasty and gross debridement of remaining teeth;  Surgeon: [name], DDS;  Location: [place];  Service: Oral Surgery;  Laterality: N/A;  . PERIPHERAL VASCULAR CATHETERIZATION N/A 09/01/2014   Procedure: A/V Shuntogram/Fistulagram;  Surgeon: Algernon Huxley, MD;  Location: [place] [facility];  Service: Cardiovascular;  Laterality: N/A;  . PERIPHERAL VASCULAR CATHETERIZATION Right 09/01/2014   Procedure: Thrombectomy;  Surgeon: Algernon Huxley, MD;  Location: [place] [facility];  Service: Cardiovascular;  Laterality: Right;  . PERIPHERAL VASCULAR CATHETERIZATION Right 09/01/2014   Procedure: A/V Shunt Intervention;  Surgeon: Algernon Huxley, MD;  Location: [place] [facility];  Service: Cardiovascular;  Laterality: Right;  . PERIPHERAL VASCULAR CATHETERIZATION N/A 09/17/2014   Procedure: A/V Shuntogram/Fistulagram;  Surgeon: Algernon Huxley, MD;  Location: [place] [facility];  Service: Cardiovascular;  Laterality: N/A;  . PERIPHERAL VASCULAR CATHETERIZATION Right 10/17/2014   Procedure: Thrombectomy;  Surgeon: Katha Cabal, MD;  Location: [place] [facility];  Service: Cardiovascular;   Laterality: Right;  . PERIPHERAL VASCULAR CATHETERIZATION N/A 10/17/2014   Procedure: A/V Shuntogram/Fistulagram;  Surgeon: Katha Cabal, MD;  Location: [place] [facility];  Service: Cardiovascular;  Laterality: N/A;  . PERIPHERAL VASCULAR CATHETERIZATION N/A 10/17/2014   Procedure: A/V Shunt Intervention;  Surgeon: Katha Cabal, MD;  Location: [place] [facility];  Service: Cardiovascular;  Laterality: N/A;  . PERIPHERAL VASCULAR CATHETERIZATION Right 11/04/2014   Procedure: Thrombectomy;  Surgeon: Katha Cabal, MD;  Location: [place] [facility];  Service: Cardiovascular;  Laterality: Right;  . PERIPHERAL VASCULAR CATHETERIZATION Left 11/04/2014   Procedure: Visceral Venography;  Surgeon: Katha Cabal, MD;  Location: [place] [facility];  Service: Cardiovascular;  Laterality: Left;  . PERIPHERAL VASCULAR CATHETERIZATION Right 11/27/2014   Procedure: A/V Shuntogram/Fistulagram;  Surgeon: Algernon Huxley, MD;  Location: [place] [facility];  Service: Cardiovascular;  Laterality: Right;  . PERIPHERAL VASCULAR CATHETERIZATION Right 11/27/2014   Procedure: A/V Shunt Intervention;  Surgeon: Algernon Huxley, MD;  Location: [place] [facility];  Service: Cardiovascular;  Laterality:  Right;  [name] PERIPHERAL VASCULAR CATHETERIZATION Right 12/31/2014   Procedure: Thrombectomy;  Surgeon: Algernon Huxley, MD;  Location: [place] [facility];  Service: Cardiovascular;  Laterality: Right;  . PERIPHERAL VASCULAR CATHETERIZATION Right 01/12/2015   Procedure: A/V Shuntogram/Fistulagram;  Surgeon: Algernon Huxley, MD;  Location: [place] [facility];  Service: Cardiovascular;  Laterality: Right;  . PERIPHERAL VASCULAR CATHETERIZATION N/A 01/12/2015   Procedure: A/V Shunt Intervention;  Surgeon: Algernon Huxley, MD;  Location: [place] [facility];  Service: Cardiovascular;  Laterality: N/A;  . PERIPHERAL VASCULAR CATHETERIZATION Right 01/28/2015   Procedure: Thrombectomy;  Surgeon: Algernon Huxley, MD;  Location: [place] [facility];  Service: Cardiovascular;  Laterality: Right;  . PERIPHERAL VASCULAR CATHETERIZATION N/A 06/08/2015   Procedure: A/V Shuntogram/Fistulagram;  Surgeon: Algernon Huxley, MD;  Location: [place] [facility];  Service: Cardiovascular;  Laterality: N/A;  . PERIPHERAL VASCULAR CATHETERIZATION N/A 06/08/2015   Procedure: A/V Shunt Intervention;  Surgeon: Algernon Huxley, MD;  Location: [place] [facility];  Service: Cardiovascular;  Laterality: N/A;  . PERIPHERAL VASCULAR CATHETERIZATION Right 07/06/2015   Procedure: A/V Shuntogram/Fistulagram;  Surgeon: Algernon Huxley, MD;  Location: [place] [facility];  Service: Cardiovascular;  Laterality: Right;  . PERIPHERAL VASCULAR CATHETERIZATION N/A 07/06/2015   Procedure: A/V Shunt Intervention;  Surgeon: Algernon Huxley, MD;  Location: [place] [facility];  Service: Cardiovascular;  Laterality: N/A;  . PERIPHERAL VASCULAR CATHETERIZATION N/A 08/04/2015   Procedure: graft declot;  Surgeon: Katha Cabal, MD;  Location: [place] [facility];  Service: Cardiovascular;  Laterality: N/A;  . PERIPHERAL VASCULAR CATHETERIZATION Right 08/25/2015   Procedure: A/V Shuntogram/Fistulagram;  Surgeon: Katha Cabal, MD;  Location: [place] [facility];  Service: Cardiovascular;  Laterality: Right;  . PERIPHERAL VASCULAR CATHETERIZATION N/A 11/04/2015   Procedure: Dialysis/Perma Catheter Insertion;  Surgeon: Algernon Huxley, MD;  Location: [place] [facility];  Service: Cardiovascular;  Laterality: N/A;  . PERIPHERAL VASCULAR CATHETERIZATION Left 12/14/2015   Procedure: A/V Shuntogram/Fistulagram;  Surgeon: Algernon Huxley, MD;  Location: [place] [facility];  Service: Cardiovascular;  Laterality: Left;  . PERIPHERAL VASCULAR CATHETERIZATION N/A 12/14/2015   Procedure: A/V Shunt Intervention;  Surgeon: Algernon Huxley, MD;  Location: [place] [facility];  Service: Cardiovascular;  Laterality: N/A;  . PERIPHERAL VASCULAR CATHETERIZATION N/A 12/17/2015   Procedure: Dialysis/Perma  Catheter Insertion;  Surgeon: Algernon Huxley, MD;  Location: [place] [facility];  Service: Cardiovascular;  Laterality: N/A;  . PERIPHERAL VASCULAR CATHETERIZATION Left 12/22/2015   Procedure: Dialysis/Perma Catheter Insertion;  Surgeon: Katha Cabal, MD;  Location: [place] [facility];  Service: Cardiovascular;  Laterality: Left;  . PERIPHERAL VASCULAR CATHETERIZATION Left 02/29/2016   Procedure: A/V Shuntogram/Fistulagram;  Surgeon: Algernon Huxley, MD;  Location: [place] [facility];  Service: Cardiovascular;  Laterality: Left;  . PERIPHERAL VASCULAR CATHETERIZATION N/A 02/29/2016   Procedure: A/V Shunt Intervention;  Surgeon: Algernon Huxley, MD;  Location: [place] [facility];  Service: Cardiovascular;  Laterality: N/A;  . right arm graft     for dyalisis  . RIGHT/LEFT HEART CATH AND CORONARY ANGIOGRAPHY N/A 08/02/2017   Procedure: RIGHT/LEFT HEART CATH AND CORONARY ANGIOGRAPHY;  Surgeon: Larey Dresser, MD;  Location: [place] [facility];  Service: Cardiovascular;  Laterality: N/A;  . TEE WITHOUT CARDIOVERSION N/A 07/31/2017   Procedure: TRANSESOPHAGEAL ECHOCARDIOGRAM (TEE);  Surgeon: Josue Hector, MD;  Location[facility] END[facility]COPY;  Service: Cardiovascular;  Laterality: N/A;  . TEE WITHOUT  CARDIOVERSION N/A 09/01/2017   Procedure: TRANSESOPHAGEAL ECHOCARDIOGRAM (TEE);  Surgeon: [name], MD;  Location: [place];  Service: Open Heart Surgery;  Laterality: N/A;  . THROMBECTOMY    . THROMBECTOMY W/ EMBOLECTOMY  03/01/2011   Procedure: THROMBECTOMY ARTERIOVENOUS GORE-TEX GRAFT;  Surgeon: Elam Dutch, MD;  Location: [place];  Service: Vascular;  Laterality: Right;  Attempted Thrombectomy of Old  Right Upper Arm Arteriovenous gortex Graft. Insertion of new Arteriovenous Graft using 18mm x 50cm Gortex Stretch graft.   . THROMBECTOMY W/ EMBOLECTOMY  07/11/2011   Procedure: THROMBECTOMY ARTERIOVENOUS GORE-TEX GRAFT;  Surgeon: Rosetta Posner, MD;  Location: [place];  Service: Vascular;  Laterality: Right;    . UMBILICAL HERNIA REPAIR    . VENOGRAM N/A 08/23/2011   Procedure: VENOGRAM;  Surgeon: [name], MD;  Location: [facility] CATH LAB;  Service: Cardiovascular;  Laterality: N/A;        Home Medications    Prior to Admission medications   Medication Sig Start Date End Date Taking? Authorizing Provider  amiodarone (PACERONE) 200 MG tablet Take 1 tablet (200 mg total) by mouth 2 (two) times daily. X 7 days, then decrease to 200 mg daily 09/10/17  Yes Barrett, Erin R, PA-C  aspirin EC 81 MG EC tablet Take 1 tablet (81 mg total) by mouth daily. 09/11/17  Yes Barrett, Erin R, PA-C  B Complex-C-Folic Acid (NEPHRO-VITE PO) Take 1 tablet by mouth daily.    Yes [provider]  chlorhexidine (PERIDEX) 0.12 % solution Rinse with 15 mls twice daily for 30 seconds. Use after breakfast and at bedtime. Spit out excess. Do not swallow. Patient taking differently: Use as directed 15 mLs in the mouth or throat 4 (four) times daily. For 30 seconds. Use after breakfast and at bedtime. Spit out excess. Do not swallow. 08/14/17  Yes [name], DDS  FOSRENOL 500 MG chewable tablet Chew 1,500-2,000 mg by mouth See admin instructions. Take 2000 mg by mouth with meals and take 1500 mg by mouth with snacks 05/28/13  Yes [provider]  midodrine (PROAMATINE) 5 MG tablet Take 3 tablets (15 mg total) by mouth 3 (three) times daily with meals. 09/10/17  Yes Barrett, Erin R, PA-C  oxyCODONE (OXY IR/ROXICODONE) 5 MG immediate release tablet Take 1 tablet (5 mg total) by mouth every 4 (four) hours as needed for severe pain. 09/10/17  Yes Barrett, Erin R, PA-C  warfarin (COUMADIN) 5 MG tablet Take 1 tablet daily except 1/2 tablet on Tuesdays, Thursdays and Saturdays or as directed by coumadin clinic Patient taking differently: Take 5 mg by mouth daily.  09/14/17  Yes Herminio Commons, MD  atorvastatin (LIPITOR) 40 MG tablet Take 1 tablet (40 mg total) by mouth daily at 6 PM. 08/03/17   Eugenie Filler,  MD    Family History Family History  Problem Relation Age of Onset  . Hypertension Mother   . Heart disease Mother   . Hypertension Father   . Heart disease Father     Social History Social History   Tobacco Use  . Smoking status: Former Smoker    Types: Cigarettes    Last attempt to quit: 04/18/1994    Years since quitting: 23.4  . Smokeless tobacco: Current User    Types: Snuff  . Tobacco comment: dips 1/2 snuff per day times 25 years  Substance Use Topics  . Alcohol use: No  . Drug use: No     Allergies   Patient  has no known allergies.   Review of Systems Review of Systems  All other systems reviewed and are negative.    Physical Exam Updated Vital Signs BP 109/76   Pulse 86   Temp 98.6 F (37 C) (Oral)   Resp (!) 23   Ht 6' (1.829 m)   Wt (!) 140.6 kg (310 lb)   SpO2 97%   BMI 42.04 kg/m   Physical Exam  Constitutional: He is oriented to person, place, and time. He appears well-developed and well-nourished.  HENT:  Head: Normocephalic and atraumatic.  Eyes: Conjunctivae are normal.  Neck: Neck supple.  Cardiovascular: Regular rhythm.  Tachycardic.  Pulmonary/Chest: Effort normal and breath sounds normal.  Abdominal: Soft. Bowel sounds are normal.  Musculoskeletal: Normal range of motion.  Neurological: He is alert and oriented to person, place, and time.  Skin: Skin is warm and dry.  Psychiatric: He has a normal mood and affect. His behavior is normal.  Nursing note and vitals reviewed.    ED Treatments / Results  Labs (all labs ordered are listed, but only abnormal results are displayed) Labs Reviewed  CBC WITH DIFFERENTIAL/PLATELET - Abnormal; Notable for the following components:      Result Value   RBC 4.21 (*)    Hemoglobin 10.5 (*)    HCT 35.0 (*)    MCH 24.9 (*)    RDW 16.1 (*)    All other components within normal limits  BASIC METABOLIC PANEL - Abnormal; Notable for the following components:   Potassium 3.2 (*)     Chloride 97 (*)    BUN 31 (*)    Creatinine, Ser 6.45 (*)    GFR calc non Af Amer 9 (*)    GFR calc Af Amer 10 (*)    All other components within normal limits  TROPONIN I - Abnormal; Notable for the following components:   Troponin I 0.03 (*)    All other components within normal limits  PROTIME-INR - Abnormal; Notable for the following components:   Prothrombin Time 21.8 (*)    All other components within normal limits  I-STAT TROPONIN, ED    EKG EKG Interpretation  Date/Time:  Saturday September 23 2017 09:15:46 EDT Ventricular Rate:  139 PR Interval:    QRS Duration: 107 QT Interval:  371 QTC Calculation: 565 R Axis:   -4 Text Interpretation:  Sinus tachycardia Low voltage, precordial leads Prolonged QT interval Confirmed by Nat Christen [phone]) on 09/23/2017 9:20:34 AM   Radiology Nm Pulmonary Perf And Vent  Result Date: 09/23/2017 CLINICAL DATA:  Chest pain EXAM: NUCLEAR MEDICINE VENTILATION - PERFUSION LUNG SCAN VIEWS: Anterior, posterior, left lateral, right lateral, RPO, LPO, RAO, LAO-ventilation and perfusion RADIOPHARMACEUTICALS:  33.0 mCi of Tc-34m DTPA aerosol inhalation and 4.4 mCi Tc62m-MAA IV COMPARISON:  Chest radiograph September 23, 2017 FINDINGS: Ventilation: Radiotracer uptake is essentially homogeneous and symmetric bilaterally without appreciable ventilation defect. Perfusion: Radiotracer uptake bilaterally is homogeneous and symmetric bilaterally. No focal perfusion defects are identified. There is no appreciable ventilation/perfusion mismatch. IMPRESSION: No appreciable ventilation or perfusion defects. This study constitutes a very low probability of pulmonary embolus. Electronically Signed   By: Lowella Grip III M.D.   On: 09/23/2017 13:17   Dg Chest Port 1 View  Result Date: 09/23/2017 CLINICAL DATA:  Tachycardia.  Renal failure. EXAM: PORTABLE CHEST 1 VIEW COMPARISON:  Sep 06, 2017 FINDINGS: There is no edema or consolidation. Heart is slightly enlarged with  pulmonary vascularity normal. Patient is status post median  sternotomy. There are surgical clips in the left cervical-thoracic junction region. No adenopathy. No bone lesions. IMPRESSION: Heart slightly enlarged but stable.  No edema or consolidation. Electronically Signed   By: Lowella Grip III M.D.   On: 09/23/2017 10:03    Procedures Procedures (including critical care time)  Medications Ordered in ED Medications  technetium TC 1M diethylenetriame-pentaacetic acid (DTPA) injection 30 millicurie (33 millicuries Inhalation Given 09/23/17 1235)  technetium albumin aggregated (MAA) injection solution 4 millicurie (4.4 millicuries Intravenous Contrast Given 09/23/17 1255)     Initial Impression / Assessment and Plan / ED Course  I have reviewed the triage vital signs and the nursing notes.  Pertinent labs & imaging results that were available during my care of the patient were reviewed by me and considered in my medical decision making (see chart for details).     Uncertain etiology of patient's tachycardia.  Exhaustive work-up including labs, chest x-ray, troponin, VQ scan all negative for acute findings.  Pulse normalized over time.  He remained hemodynamically stable.  He had no complaints at discharge.  Final Clinical Impressions(s) / ED Diagnoses   Final diagnoses:  Sinus tachycardia    ED Discharge Orders    None       Nat Christen, MD 09/24/17 [phone]

## 2017-09-26 ENCOUNTER — Telehealth: Payer: Self-pay | Admitting: Cardiovascular Disease

## 2017-09-26 DIAGNOSIS — E611 Iron deficiency: Secondary | ICD-10-CM | POA: Diagnosis not present

## 2017-09-26 DIAGNOSIS — N186 End stage renal disease: Secondary | ICD-10-CM | POA: Diagnosis not present

## 2017-09-26 DIAGNOSIS — N2581 Secondary hyperparathyroidism of renal origin: Secondary | ICD-10-CM | POA: Diagnosis not present

## 2017-09-26 DIAGNOSIS — Z992 Dependence on renal dialysis: Secondary | ICD-10-CM | POA: Diagnosis not present

## 2017-09-26 NOTE — Telephone Encounter (Signed)
Msg sent to EP scheduler to request appointment with EP doctor.

## 2017-09-26 NOTE — Telephone Encounter (Signed)
Spoke with nephrology PA, Ramiro Harvest. Patient was tachycardic during dialysis with HR 120-130 bpm range. SBP in 110 mmHg range. He is also on midodrine for hypotension. Patient was asymptomatic. Nephrology attempted to dialyze a smaller volume to see if this would improve HR but it did not. Recent ED evaluation for tachycardia as well.  I saw him in the office on 09/20/17 and he was doing well, at which time I made an EP referral for recurrent paroxysmal SVT. ECG from 09/23/17 demonstrates sinus tachycardia. He is currently on amiodarone. I have recommended a trial of low dose metoprolol 12.5 mg bid. I will attempt to have him evaluated by EP sooner as this is a persistent issue.

## 2017-09-26 NOTE — Telephone Encounter (Signed)
Sent to Dr. Bronson Ing.

## 2017-09-26 NOTE — Telephone Encounter (Signed)
Spoke with PA, Ramiro Harvest. Please refer to telephone note.

## 2017-09-26 NOTE — Telephone Encounter (Signed)
Per phone call from Ramiro Harvest PA w/ Dr. Lowanda Foster called stating pt's heart rate is in the 140's. Pt is currently on dialysis now and would like someone to give him a call @ [phone]

## 2017-09-27 ENCOUNTER — Ambulatory Visit (INDEPENDENT_AMBULATORY_CARE_PROVIDER_SITE_OTHER): Payer: Medicare Other | Admitting: *Deleted

## 2017-09-27 DIAGNOSIS — T82868D Thrombosis of vascular prosthetic devices, implants and grafts, subsequent encounter: Secondary | ICD-10-CM

## 2017-09-27 DIAGNOSIS — Z5181 Encounter for therapeutic drug level monitoring: Secondary | ICD-10-CM

## 2017-09-27 DIAGNOSIS — Z954 Presence of other heart-valve replacement: Secondary | ICD-10-CM | POA: Diagnosis not present

## 2017-09-27 LAB — POCT INR: INR: 2.8 (ref 2.0–3.0)

## 2017-09-27 NOTE — Patient Instructions (Signed)
Decrease coumadin to 1 tablet daily except 1/2 tablet on Wednesdays and Saturdays  Amiodarone decreased to 200 mg daily on 6/7 Recheck in 1 week

## 2017-09-28 DIAGNOSIS — N2581 Secondary hyperparathyroidism of renal origin: Secondary | ICD-10-CM | POA: Diagnosis not present

## 2017-09-28 DIAGNOSIS — E611 Iron deficiency: Secondary | ICD-10-CM | POA: Diagnosis not present

## 2017-09-28 DIAGNOSIS — Z992 Dependence on renal dialysis: Secondary | ICD-10-CM | POA: Diagnosis not present

## 2017-09-28 DIAGNOSIS — N186 End stage renal disease: Secondary | ICD-10-CM | POA: Diagnosis not present

## 2017-09-30 DIAGNOSIS — N2581 Secondary hyperparathyroidism of renal origin: Secondary | ICD-10-CM | POA: Diagnosis not present

## 2017-09-30 DIAGNOSIS — N186 End stage renal disease: Secondary | ICD-10-CM | POA: Diagnosis not present

## 2017-09-30 DIAGNOSIS — E611 Iron deficiency: Secondary | ICD-10-CM | POA: Diagnosis not present

## 2017-09-30 DIAGNOSIS — Z992 Dependence on renal dialysis: Secondary | ICD-10-CM | POA: Diagnosis not present

## 2017-10-03 DIAGNOSIS — N2581 Secondary hyperparathyroidism of renal origin: Secondary | ICD-10-CM | POA: Diagnosis not present

## 2017-10-03 DIAGNOSIS — E611 Iron deficiency: Secondary | ICD-10-CM | POA: Diagnosis not present

## 2017-10-03 DIAGNOSIS — Z992 Dependence on renal dialysis: Secondary | ICD-10-CM | POA: Diagnosis not present

## 2017-10-03 DIAGNOSIS — N186 End stage renal disease: Secondary | ICD-10-CM | POA: Diagnosis not present

## 2017-10-03 LAB — FUNGUS CULTURE RESULT

## 2017-10-03 LAB — FUNGUS CULTURE WITH STAIN

## 2017-10-03 LAB — FUNGAL ORGANISM REFLEX

## 2017-10-04 ENCOUNTER — Ambulatory Visit (INDEPENDENT_AMBULATORY_CARE_PROVIDER_SITE_OTHER): Payer: Medicare Other | Admitting: *Deleted

## 2017-10-04 DIAGNOSIS — Z5181 Encounter for therapeutic drug level monitoring: Secondary | ICD-10-CM | POA: Diagnosis not present

## 2017-10-04 DIAGNOSIS — T82868D Thrombosis of vascular prosthetic devices, implants and grafts, subsequent encounter: Secondary | ICD-10-CM | POA: Diagnosis not present

## 2017-10-04 DIAGNOSIS — Z954 Presence of other heart-valve replacement: Secondary | ICD-10-CM | POA: Diagnosis not present

## 2017-10-04 LAB — POCT INR: INR: 2 (ref 2.0–3.0)

## 2017-10-04 NOTE — Patient Instructions (Signed)
Increase coumadin to 1 tablet daily except 1/2 tablet on Saturdays  Amiodarone decreased to 200 mg daily on 6/7 Recheck in 2 weeks

## 2017-10-05 DIAGNOSIS — Z992 Dependence on renal dialysis: Secondary | ICD-10-CM | POA: Diagnosis not present

## 2017-10-05 DIAGNOSIS — N2581 Secondary hyperparathyroidism of renal origin: Secondary | ICD-10-CM | POA: Diagnosis not present

## 2017-10-05 DIAGNOSIS — E611 Iron deficiency: Secondary | ICD-10-CM | POA: Diagnosis not present

## 2017-10-05 DIAGNOSIS — N186 End stage renal disease: Secondary | ICD-10-CM | POA: Diagnosis not present

## 2017-10-06 ENCOUNTER — Other Ambulatory Visit: Payer: Self-pay | Admitting: Thoracic Surgery (Cardiothoracic Vascular Surgery)

## 2017-10-06 DIAGNOSIS — I2510 Atherosclerotic heart disease of native coronary artery without angina pectoris: Secondary | ICD-10-CM

## 2017-10-07 DIAGNOSIS — E611 Iron deficiency: Secondary | ICD-10-CM | POA: Diagnosis not present

## 2017-10-07 DIAGNOSIS — Z992 Dependence on renal dialysis: Secondary | ICD-10-CM | POA: Diagnosis not present

## 2017-10-07 DIAGNOSIS — N186 End stage renal disease: Secondary | ICD-10-CM | POA: Diagnosis not present

## 2017-10-07 DIAGNOSIS — N2581 Secondary hyperparathyroidism of renal origin: Secondary | ICD-10-CM | POA: Diagnosis not present

## 2017-10-09 ENCOUNTER — Encounter: Payer: Self-pay | Admitting: Thoracic Surgery (Cardiothoracic Vascular Surgery)

## 2017-10-12 DIAGNOSIS — N2581 Secondary hyperparathyroidism of renal origin: Secondary | ICD-10-CM | POA: Diagnosis not present

## 2017-10-12 DIAGNOSIS — N186 End stage renal disease: Secondary | ICD-10-CM | POA: Diagnosis not present

## 2017-10-12 DIAGNOSIS — E611 Iron deficiency: Secondary | ICD-10-CM | POA: Diagnosis not present

## 2017-10-12 DIAGNOSIS — Z992 Dependence on renal dialysis: Secondary | ICD-10-CM | POA: Diagnosis not present

## 2017-10-14 DIAGNOSIS — N2581 Secondary hyperparathyroidism of renal origin: Secondary | ICD-10-CM | POA: Diagnosis not present

## 2017-10-14 DIAGNOSIS — Z992 Dependence on renal dialysis: Secondary | ICD-10-CM | POA: Diagnosis not present

## 2017-10-14 DIAGNOSIS — E611 Iron deficiency: Secondary | ICD-10-CM | POA: Diagnosis not present

## 2017-10-14 DIAGNOSIS — N186 End stage renal disease: Secondary | ICD-10-CM | POA: Diagnosis not present

## 2017-10-15 DIAGNOSIS — Z992 Dependence on renal dialysis: Secondary | ICD-10-CM | POA: Diagnosis not present

## 2017-10-15 DIAGNOSIS — N186 End stage renal disease: Secondary | ICD-10-CM | POA: Diagnosis not present

## 2017-10-16 ENCOUNTER — Institutional Professional Consult (permissible substitution): Payer: Medicare Other | Admitting: Internal Medicine

## 2017-10-17 DIAGNOSIS — N2581 Secondary hyperparathyroidism of renal origin: Secondary | ICD-10-CM | POA: Diagnosis not present

## 2017-10-17 DIAGNOSIS — N186 End stage renal disease: Secondary | ICD-10-CM | POA: Diagnosis not present

## 2017-10-17 DIAGNOSIS — Z992 Dependence on renal dialysis: Secondary | ICD-10-CM | POA: Diagnosis not present

## 2017-10-17 DIAGNOSIS — E611 Iron deficiency: Secondary | ICD-10-CM | POA: Diagnosis not present

## 2017-10-18 ENCOUNTER — Ambulatory Visit (INDEPENDENT_AMBULATORY_CARE_PROVIDER_SITE_OTHER): Payer: Medicare Other | Admitting: *Deleted

## 2017-10-18 DIAGNOSIS — T82868D Thrombosis of vascular prosthetic devices, implants and grafts, subsequent encounter: Secondary | ICD-10-CM | POA: Diagnosis not present

## 2017-10-18 DIAGNOSIS — Z5181 Encounter for therapeutic drug level monitoring: Secondary | ICD-10-CM

## 2017-10-18 DIAGNOSIS — Z954 Presence of other heart-valve replacement: Secondary | ICD-10-CM | POA: Diagnosis not present

## 2017-10-18 LAB — ACID FAST CULTURE WITH REFLEXED SENSITIVITIES: ACID FAST CULTURE - AFSCU3: NEGATIVE

## 2017-10-18 LAB — ACID FAST CULTURE WITH REFLEXED SENSITIVITIES (MYCOBACTERIA)

## 2017-10-18 LAB — POCT INR: INR: 1.9 — AB (ref 2.0–3.0)

## 2017-10-18 NOTE — Patient Instructions (Signed)
Increase coumadin to 1 tablet daily except 1 1/2 tablets on Wednesdays  Amiodarone decreased to 200 mg daily on 6/7 Recheck in 2 weeks

## 2017-10-19 DIAGNOSIS — N186 End stage renal disease: Secondary | ICD-10-CM | POA: Diagnosis not present

## 2017-10-19 DIAGNOSIS — N2581 Secondary hyperparathyroidism of renal origin: Secondary | ICD-10-CM | POA: Diagnosis not present

## 2017-10-19 DIAGNOSIS — Z992 Dependence on renal dialysis: Secondary | ICD-10-CM | POA: Diagnosis not present

## 2017-10-19 DIAGNOSIS — E611 Iron deficiency: Secondary | ICD-10-CM | POA: Diagnosis not present

## 2017-10-21 DIAGNOSIS — Z992 Dependence on renal dialysis: Secondary | ICD-10-CM | POA: Diagnosis not present

## 2017-10-21 DIAGNOSIS — N2581 Secondary hyperparathyroidism of renal origin: Secondary | ICD-10-CM | POA: Diagnosis not present

## 2017-10-21 DIAGNOSIS — N186 End stage renal disease: Secondary | ICD-10-CM | POA: Diagnosis not present

## 2017-10-21 DIAGNOSIS — E611 Iron deficiency: Secondary | ICD-10-CM | POA: Diagnosis not present

## 2017-10-23 ENCOUNTER — Encounter: Payer: Self-pay | Admitting: Physician Assistant

## 2017-10-23 ENCOUNTER — Ambulatory Visit
Admission: RE | Admit: 2017-10-23 | Discharge: 2017-10-23 | Disposition: A | Discharge status: Home / Self Care | Payer: Medicare Other | Source: Ambulatory Visit | Attending: Thoracic Surgery (Cardiothoracic Vascular Surgery) | Admitting: Thoracic Surgery (Cardiothoracic Vascular Surgery)

## 2017-10-23 ENCOUNTER — Ambulatory Visit (INDEPENDENT_AMBULATORY_CARE_PROVIDER_SITE_OTHER): Payer: Self-pay | Admitting: Physician Assistant

## 2017-10-23 ENCOUNTER — Other Ambulatory Visit: Payer: Self-pay | Admitting: *Deleted

## 2017-10-23 ENCOUNTER — Other Ambulatory Visit: Payer: Self-pay

## 2017-10-23 VITALS — BP 112/77 | HR 90 | Resp 16 | Ht 72.0 in | Wt 307.0 lb

## 2017-10-23 DIAGNOSIS — Z951 Presence of aortocoronary bypass graft: Secondary | ICD-10-CM | POA: Diagnosis not present

## 2017-10-23 DIAGNOSIS — I2510 Atherosclerotic heart disease of native coronary artery without angina pectoris: Secondary | ICD-10-CM

## 2017-10-23 DIAGNOSIS — Z954 Presence of other heart-valve replacement: Secondary | ICD-10-CM

## 2017-10-23 MED ORDER — ATORVASTATIN CALCIUM 40 MG PO TABS: 40.0000 mg | ORAL_TABLET | Freq: Every day | ORAL | 0 refills | Status: DC

## 2017-10-23 NOTE — Patient Instructions (Signed)
You are encouraged to enroll and participate in the outpatient cardiac rehab program beginning as soon as practical.  You may return to driving an automobile as long as you are no longer requiring oral narcotic pain relievers during the daytime.  It would be wise to start driving only short distances during the daylight and gradually increase from there as you feel comfortable.  Endocarditis is a potentially serious infection of heart valves or inside lining of the heart.  It occurs more commonly in patients with diseased heart valves (such as patient's with aortic or mitral valve disease) and in patients who have undergone heart valve repair or replacement.  Certain surgical and dental procedures may put you at risk, such as dental cleaning, other dental procedures, or any surgery involving the respiratory, urinary, gastrointestinal tract, gallbladder or prostate gland.   To minimize your chances for develooping endocarditis, maintain good oral health and seek prompt medical attention for any infections involving the mouth, teeth, gums, skin or urinary tract.    Always notify your doctor or dentist about your underlying heart valve condition before having any invasive procedures. You will need to take antibiotics before certain procedures, including all routine dental cleanings or other dental procedures.  Your cardiologist or dentist should prescribe these antibiotics for you to be taken ahead of time.  Make every effort to stay physically active, get some type of exercise on a regular basis, and stick to a "heart healthy diet".  The long term benefits for regular exercise and a healthy diet are critically important to your overall health and wellbeing.  Please follow-up in 2 months.

## 2017-10-23 NOTE — Progress Notes (Signed)
[address]       [place] [address]             [phone]      [name] [name] is a 52 y.o. male patient s/p aortic valve replacement with a bileaflet mechanical valve and CABG x1.  The patient was in the emergency department June 8 for tachycardia.  It seems there was an uncertain etiology for the elevated heart rate.  He remained hemodynamically stable.  The work-up including labs, chest x-ray, troponin, and VQ scan were all negative for acute findings.  The tachycardia occurred right after dialysis.  They have since started a new medication that he cannot remember the name of during his dialysis and the tachycardia has not recurred.  1. S/P aortic valve replacement with bileaflet mechanical valve   2. S/P CABG x 1    Past Medical History:  Diagnosis Date  . Aneurysm ([place])     Right arm fistula 3 aneurysms 2011,   plans to have a new procedure  . Angina   . Chest discomfort   . CHF (congestive heart failure) ([place])   . Coronary artery disease   . Coronary artery disease involving native coronary artery of native heart without angina pectoris   . Dialysis patient ([place])   . Dysrhythmia   . ESRD (end stage renal disease) ([place])    on hemodialysis T_[id number]  . Hypertension   . Leg pain   . Mitral regurgitation   . Morbid obesity ([place])   . Orthostatic hypotension   . Overweight(278.02)   . Renal insufficiency   . 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 Severe aortic insufficiency   . Sinus tachycardia   . Syncope     positional after dialysis... 2008   No past surgical history pertinent negatives on file.   Scheduled Meds: Current Outpatient Medications on File Prior to Visit  Medication Sig Dispense Refill  . amiodarone (PACERONE) 200 MG tablet Take 1 tablet (200 mg total) by mouth 2 (two) times daily. X 7 days, then  decrease to 200 mg daily 60 tablet 1  . aspirin EC 81 MG EC tablet Take 1 tablet (81 mg total) by mouth daily.    [name] atorvastatin (LIPITOR) 40 MG tablet Take 1 tablet (40 mg total) by mouth daily at 6 PM. 30 tablet 0  . B Complex-C-Folic Acid (NEPHRO-VITE PO) Take 1 tablet by mouth daily.     . chlorhexidine (PERIDEX) 0.12 % solution Rinse with 15 mls twice daily for 30 seconds. Use after breakfast and at bedtime. Spit out excess. Do not swallow. (Patient taking differently: Use as directed 15 mLs in the mouth or throat 4 (four) times daily. For 30 seconds. Use after breakfast and at bedtime. Spit out excess. Do not swallow.) 960 mL prn  . FOSRENOL 500 MG chewable tablet Chew 1,500-2,000 mg by mouth See admin instructions. Take 2000 mg by mouth with meals and take 1500 mg by mouth with snacks    . midodrine (PROAMATINE) 5 MG tablet Take 3 tablets (15 mg total) by mouth 3 (three) times daily with meals. 90 tablet 2  . oxyCODONE (OXY IR/ROXICODONE) 5 MG immediate release tablet Take 1 tablet (5 mg total) by mouth every 4 (four) hours as needed for severe pain. 30 tablet 0  .  warfarin (COUMADIN) 5 MG tablet Take 1 tablet daily except 1/2 tablet on Tuesdays, Thursdays and Saturdays or as directed by coumadin clinic (Patient taking differently: Take 5 mg by mouth daily. ) 90 tablet 1   No current facility-administered medications on file prior to visit.     Blood pressure 112/77, pulse 90, resp. rate 16, height 6' (1.829 m), weight (!) 139.3 kg (307 lb), SpO2 96 %.  Subjective Mr. Bertha returns for his routine 4-week postop appointment.  He is status post mechanical aortic valve replacement and coronary artery bypass grafting x1.  He has had no symptoms of fevers, chills, or night sweats.  He has had very limited amount of pain throughout the whole postoperative course.  He has started to sleep better through the night.  He has not had any shortness of breath with activity.  Objective  Cor: Regular rate  and rhythm, no murmur Pulm: Clear to auscultation bilaterally in all fields Abdomen: No tenderness Extremity: No swelling Wound: Sternotomy incision is well-healed.  It is clean and dry without drainage.  Right endoscopic vein harvesting site is clean and dry and well-healed.   CLINICAL DATA:  Recent coronary artery bypass grafting and aortic valve replacement.  EXAM: CHEST - 2 VIEW  COMPARISON:  September 23, 2017  FINDINGS: There is airspace consolidation in the left lower lobe, consistent with a focal area of pneumonia. Lungs are elsewhere clear. Heart size and pulmonary vascularity are normal. Patient is status post coronary artery bypass grafting and aortic valve replacement. No adenopathy. No bone lesions. There are surgical clips in the left cervical-thoracic junction region.  IMPRESSION: Patchy infiltrate felt to represent pneumonia left lower lobe. Lungs elsewhere clear. Stable cardiac silhouette. No pneumothorax.  These results will be called to the ordering clinician or representative by the Radiologist Assistant, and communication documented in the PACS or zVision Dashboard.   Electronically Signed   By: Lowella Grip III M.D.   On: 10/23/2017 14:41   Assessment & Plan   Mr. Turano returns for his 4-week routine postoperative visit today.  Overall he is doing quite well.  He has been increasing his activity level as tolerated.  He has not been taking any narcotic pain medicine therefore I have released him to drive.  He has been sleeping better over the last several nights.  He has had hardly any postoperative pain.  The possible pneumonia on chest x-ray is noted.  Both myself and Dr. Roxy Manns reviewed the image and do not think that the clinical picture correlates with pneumonia.  This is likely some atelectasis and postoperative changes.  I have encouraged the patient to continue using his incentive spirometer.  He is also encouraged to walk several times a day.   He is not interested at this time in a cardiac rehab program.  He has already had a cardiology appointment.  His INR is being monitored by his cardiology team and his last INR was 1.9.  They are titrating his Coumadin accordingly to maintain an INR of 2.0-3.0 for his aortic mechanical valve.  I reviewed the chest x-ray with both the patient and his wife.  I answered all questions to their satisfaction.  He will follow-up with Dr. Roxy Manns in 2 months.  He will not need a chest x-ray at this time unless new pulmonary symptoms occur.  He is to call our office with any questions or concerns.   Elgie Collard 10/23/2017

## 2017-10-24 DIAGNOSIS — N186 End stage renal disease: Secondary | ICD-10-CM | POA: Diagnosis not present

## 2017-10-24 DIAGNOSIS — E611 Iron deficiency: Secondary | ICD-10-CM | POA: Diagnosis not present

## 2017-10-24 DIAGNOSIS — N2581 Secondary hyperparathyroidism of renal origin: Secondary | ICD-10-CM | POA: Diagnosis not present

## 2017-10-24 DIAGNOSIS — Z992 Dependence on renal dialysis: Secondary | ICD-10-CM | POA: Diagnosis not present

## 2017-10-25 ENCOUNTER — Other Ambulatory Visit (HOSPITAL_COMMUNITY): Payer: Self-pay

## 2017-10-25 ENCOUNTER — Ambulatory Visit (HOSPITAL_BASED_OUTPATIENT_CLINIC_OR_DEPARTMENT_OTHER)
Admission: RE | Admit: 2017-10-25 | Discharge: 2017-10-25 | Disposition: A | Discharge status: Home / Self Care | Payer: Medicare Other | Source: Ambulatory Visit

## 2017-10-25 ENCOUNTER — Other Ambulatory Visit: Payer: Self-pay

## 2017-10-25 ENCOUNTER — Encounter (HOSPITAL_COMMUNITY): Payer: Self-pay | Admitting: Cardiology

## 2017-10-25 ENCOUNTER — Ambulatory Visit (HOSPITAL_COMMUNITY)
Admission: RE | Admit: 2017-10-25 | Discharge: 2017-10-25 | Disposition: A | Discharge status: Home / Self Care | Payer: Medicare Other | Source: Ambulatory Visit | Attending: Cardiology | Admitting: Cardiology

## 2017-10-25 VITALS — BP 114/73 | HR 80 | Wt 306.2 lb

## 2017-10-25 DIAGNOSIS — Z992 Dependence on renal dialysis: Secondary | ICD-10-CM | POA: Insufficient documentation

## 2017-10-25 DIAGNOSIS — I471 Supraventricular tachycardia: Secondary | ICD-10-CM | POA: Diagnosis not present

## 2017-10-25 DIAGNOSIS — Z951 Presence of aortocoronary bypass graft: Secondary | ICD-10-CM | POA: Diagnosis not present

## 2017-10-25 DIAGNOSIS — Z952 Presence of prosthetic heart valve: Secondary | ICD-10-CM | POA: Diagnosis not present

## 2017-10-25 DIAGNOSIS — Z7901 Long term (current) use of anticoagulants: Secondary | ICD-10-CM | POA: Insufficient documentation

## 2017-10-25 DIAGNOSIS — I5022 Chronic systolic (congestive) heart failure: Secondary | ICD-10-CM

## 2017-10-25 DIAGNOSIS — Z7982 Long term (current) use of aspirin: Secondary | ICD-10-CM | POA: Diagnosis not present

## 2017-10-25 DIAGNOSIS — E785 Hyperlipidemia, unspecified: Secondary | ICD-10-CM

## 2017-10-25 DIAGNOSIS — Z87891 Personal history of nicotine dependence: Secondary | ICD-10-CM | POA: Insufficient documentation

## 2017-10-25 DIAGNOSIS — Z954 Presence of other heart-valve replacement: Secondary | ICD-10-CM | POA: Diagnosis not present

## 2017-10-25 DIAGNOSIS — Z79899 Other long term (current) drug therapy: Secondary | ICD-10-CM | POA: Insufficient documentation

## 2017-10-25 DIAGNOSIS — Z8249 Family history of ischemic heart disease and other diseases of the circulatory system: Secondary | ICD-10-CM | POA: Insufficient documentation

## 2017-10-25 DIAGNOSIS — I429 Cardiomyopathy, unspecified: Secondary | ICD-10-CM | POA: Insufficient documentation

## 2017-10-25 DIAGNOSIS — I2510 Atherosclerotic heart disease of native coronary artery without angina pectoris: Secondary | ICD-10-CM | POA: Insufficient documentation

## 2017-10-25 DIAGNOSIS — I080 Rheumatic disorders of both mitral and aortic valves: Secondary | ICD-10-CM | POA: Diagnosis not present

## 2017-10-25 DIAGNOSIS — N186 End stage renal disease: Secondary | ICD-10-CM | POA: Insufficient documentation

## 2017-10-25 DIAGNOSIS — I509 Heart failure, unspecified: Secondary | ICD-10-CM | POA: Diagnosis present

## 2017-10-25 LAB — COMPREHENSIVE METABOLIC PANEL
ALBUMIN: 2.9 g/dL — AB (ref 3.5–5.0)
ALK PHOS: 89 U/L (ref 38–126)
ALT: 12 U/L (ref 0–44)
ANION GAP: 13 (ref 5–15)
AST: 16 U/L (ref 15–41)
BILIRUBIN TOTAL: 0.6 mg/dL (ref 0.3–1.2)
BUN: 33 mg/dL — ABNORMAL HIGH (ref 6–20)
CALCIUM: 9.7 mg/dL (ref 8.9–10.3)
CO2: 28 mmol/L (ref 22–32)
CREATININE: 8.38 mg/dL — AB (ref 0.61–1.24)
Chloride: 97 mmol/L — ABNORMAL LOW (ref 98–111)
GFR calc non Af Amer: 6 mL/min — ABNORMAL LOW (ref >60)
GFR, EST AFRICAN AMERICAN: 8 mL/min — AB (ref >60)
GLUCOSE: 77 mg/dL (ref 70–99)
Potassium: 4.4 mmol/L (ref 3.5–5.1)
Sodium: 138 mmol/L (ref 135–145)
TOTAL PROTEIN: 7.7 g/dL (ref 6.5–8.1)

## 2017-10-25 LAB — LIPID PANEL
Cholesterol: 180 mg/dL (ref 0–200)
HDL: 44 mg/dL (ref >40)
LDL CALC: 118 mg/dL — AB (ref 0–99)
TRIGLYCERIDES: 89 mg/dL (ref <150)
Total CHOL/HDL Ratio: 4.1 RATIO
VLDL: 18 mg/dL (ref 0–40)

## 2017-10-25 LAB — TSH: TSH: 1.819 u[IU]/mL (ref 0.350–4.500)

## 2017-10-25 NOTE — Progress Notes (Signed)
  Echocardiogram 2D Echocardiogram has been performed.  [name] [name] L Androw 10/25/2017, 9:52 AM

## 2017-10-25 NOTE — Progress Notes (Signed)
[facility]: Dr. Aundra Dubin  52 yo with valvular heart disease, CAD, ESRD, and SVT presents for followup of aortic valve disease and CHF.  Patient has long history of ESRD, presumed due to HTN.  He was admitted in 4/19 with acute CHF.  Echo was done, showing EF 20-25% with the appearance of severe AI.  TEE confirmed EF 25-30% with severe AI/mild AS and moderate MR.  RHC/LHC showed serial 80% stenoses in the ostial and proximal LCx.  Volume was controlled with dialysis.  Patient has a long history of sinus tachycardia and has tolerated cardiac medications poorly due to low BP with HD.  He was noted to have runs of SVT with rates in the 140s-150s.    In 5/19, he had mechanical AVR with SVG-OM.  He had post-op atrial fibrillation and also had runs of SVT post-op.  Amiodarone was started.   He had an episode in 6/19 of SVT which took him to the ER.   Echo was done today, showing EF 30%, moderate LV dilation, mildly decreased RV systolic function, normal mechanical AoV, mild MR.   He is doing well symptomatically.  Breathing is better post-surgery.  Dyspneic only with long distances walking.  Some chest soreness at surgical site.  No cough. No orthopnea/PND.  He has not wanted to do cardiac rehab.  Still has problems with periodic hypotension and on midodrine.   ECG (personally reviewed): NSR, nonspecific T wave flattening  PMH: 1. ESRD: TTS HD.  2. H/o SVT 3. CAD: LHC (4/19) with 40% mLAD, 95% ostial small D2, 70% mid small D3, 80% ostial/80% proximal LCx.   - SVG-OM in 5/19 along with AVR.  4. Chronic systolic CHF: TEE (7/37) with EF 25-30%, diffuse hypokinesis, trileaflet and calcified aortic valve with severe AI/moderate AS, moderate MR.  - RHC (4/19): mean RA 11, PA 53/24, mean PCWP 26, CI 2.67, PVR 1.6 WU 5. Valvular heart disease: Severe AI/mild AS and moderate MR on 4/19 TEE.  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  - Mechanical AVR 5/19.  - Echo was 7/19 showed EF 30%,  moderate LV dilation, mildly decreased RV systolic function, normal mechanical AoV, mild MR.  6. H/o sinus tachycardia 7. Atrial fibrillation: Post-op AVR.   Social History   Socioeconomic History  . Marital status: Married    Spouse name: Not on file  . Number of children: Not on file  . Years of education: Not on file  . Highest education level: Not on file  Occupational History  . Occupation: disabled  Social Needs  . Financial resource strain: Not on file  . Food insecurity:    Worry: Not on file    Inability: Not on file  . Transportation needs:    Medical: Not on file    Non-medical: Not on file  Tobacco Use  . Smoking status: Former Smoker    Types: Cigarettes    Last attempt to quit: 04/18/1994    Years since quitting: 23.5  . Smokeless tobacco: Current User    Types: Snuff  . Tobacco comment: dips 1/2 snuff per day times 25 years  Substance and Sexual Activity  . Alcohol use: No  . Drug use: No  . Sexual activity: Not on file  Lifestyle  . Physical activity:    Days per week: Not on file    Minutes per session: Not on file  . Stress: Not on file  Relationships  . Social connections:    Talks on phone: Not  on file    Gets together: Not on file    Attends religious service: Not on file    Active member of club or organization: Not on file    Attends meetings of clubs or organizations: Not on file    Relationship status: Not on file  . Intimate partner violence:    Fear of current or ex partner: Not on file    Emotionally abused: Not on file    Physically abused: Not on file    Forced sexual activity: Not on file  Other Topics Concern  . Not on file  Social History Narrative  . Not on file   Family History  Problem Relation Age of Onset  . Hypertension Mother   . Heart disease Mother   . Hypertension Father   . Heart disease Father    ROS: All systems reviewed and negative except as per HPI.  Current Outpatient Medications  Medication Sig  Dispense Refill  . amiodarone (PACERONE) 200 MG tablet Take 200 mg by mouth daily.    [name] aspirin EC 81 MG EC tablet Take 1 tablet (81 mg total) by mouth daily.    [name] atorvastatin (LIPITOR) 40 MG tablet Take 1 tablet (40 mg total) by mouth daily at 6 PM. 30 tablet 0  . B Complex-C-Folic Acid (NEPHRO-VITE PO) Take 1 tablet by mouth daily.     . chlorhexidine (PERIDEX) 0.12 % solution Rinse with 15 mls twice daily for 30 seconds. Use after breakfast and at bedtime. Spit out excess. Do not swallow. (Patient taking differently: Use as directed 15 mLs in the mouth or throat 4 (four) times daily. For 30 seconds. Use after breakfast and at bedtime. Spit out excess. Do not swallow.) 960 mL prn  . FOSRENOL 500 MG chewable tablet Chew 1,500-2,000 mg by mouth See admin instructions. Take 2000 mg by mouth with meals and take 1500 mg by mouth with snacks    . midodrine (PROAMATINE) 5 MG tablet Take 3 tablets (15 mg total) by mouth 3 (three) times daily with meals. 90 tablet 2  . oxyCODONE (OXY IR/ROXICODONE) 5 MG immediate release tablet Take 1 tablet (5 mg total) by mouth every 4 (four) hours as needed for severe pain. 30 tablet 0  . warfarin (COUMADIN) 5 MG tablet Take 1 tablet daily except 1/2 tablet on Tuesdays, Thursdays and Saturdays or as directed by coumadin clinic (Patient taking differently: Take 5 mg by mouth daily. ) 90 tablet 1   No current facility-administered medications for this encounter.    BP 114/73   Pulse 80   Wt (!) 306 lb 4 oz (138.9 kg)   SpO2 99%   BMI 41.53 kg/m  General: NAD Neck: No JVD, no thyromegaly or thyroid nodule.  Lungs: Clear to auscultation bilaterally with normal respiratory effort. CV: Nondisplaced PMI.  Heart regular S1/S2 with mechanical S2, no S3/S4, 2/6 early SEM RUSB.  No peripheral edema.  No carotid bruit.  Normal pedal pulses.  Abdomen: Soft, nontender, no hepatosplenomegaly, no distention.  Skin: Intact without lesions or rashes.  Neurologic: Alert and  oriented x 3.  Psych: Normal affect. Extremities: No clubbing or cyanosis.  HEENT: Normal.   Assessment/Plan: 1. 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 No active vegetation seen. Blood cultures were negative during 4/19 hospitalization. Severe AI could certainly potentiate CHF. This was not see on 2012 echo. The LV is  not significantly dilated, so not sure AI is the only explanation for his cardiomyopathy.  He is now s/p mechanical AVR 5/19. Echo 7/19 showed normal mechanical aortic valve with mild MR.    - Continue ASA 81 and warfarin with goal INR 2-3 for mechanical AoV.  - He will need antibiotic prophylaxis with dental work.  2. Chronic systolic CHF: Echo 9/16 with EF 20-25%. As LV is not markedly dilated, I am not sure that aortic insufficiency can be invoked as the only cause for cardiomyopathy. He had serial 80% stenoses in the LCx, but do not think this can explain all of the cardiomyopathy either (he is now s/p SVG-OM with 5/19 surgery).  It is possible that cardiomyopathy is multifactorial.  Echo today shows EF 30% post-op AVR/CABG.  Volume status looks ok on exam today. NYHA class II symptoms.   - Volume controlled by HD.  - He has not tolerated BP-active meds due to hypotension and need for midodrine.   - With persistently low EF, will refer to EP for ICD evaluation.  Has narrow QRS and would not qualify for CRT.  Given HD, benefit is probably not going to be as marked, but he is relatively young.  3. ESRD: TTS HD.   4. SVT: He has had runs of SVT periodically, usually symptomatic, most recently taking him to the ER in 6/19.   - He is on amiodarone to try to suppress SVT.  Check LFTs/TSH today.  He will need a regular eye exam while on amiodarone.  - I am going to refer him to EP as above, would also consider SVT ablation.  6. CAD: See cath above. Had  SVG-OM with valve surgery.  - No ASA given warfarin use.  - Continue statin, check lipids today.    Followup in 3 months.   Loralie Champagne 10/25/2017

## 2017-10-25 NOTE — Patient Instructions (Signed)
Labs today  You have been referred to EP to discuss getting a defibrillator and ablation  We will contact you in 3 months to schedule your next appointment.  Cardioverter Defibrillator Implantation An implantable cardioverter defibrillator (ICD) is a small device that is placed under the skin in the chest or abdomen. An ICD consists of a battery, a small computer (pulse generator), and wires (leads) that go into the heart. An ICD is used to detect and correct two types of dangerous irregular heartbeats (arrhythmias):  A rapid heart rhythm (tachycardia).  An arrhythmia in which the lower chambers of the heart (ventricles) contract in an uncoordinated way (fibrillation).  When an ICD detects tachycardia, it sends a low-energy shock to the heart to restore the heartbeat to normal (cardioversion). This signal is usually painless. If cardioversion does not work or if the ICD detects fibrillation, it delivers a high-energy shock to the heart (defibrillation) to restart the heart. This shock may feel like a strong jolt in the chest. Your health care provider may prescribe an ICD if:  You have had an arrhythmia that originated in the ventricles.  Your heart has been damaged by a disease or heart condition.  Sometimes, ICDs are programmed to act as a device called a pacemaker. Pacemakers can be used to treat a slow heartbeat (bradycardia) or tachycardia by taking over the heart rate with electrical impulses. Tell a health care provider about:  Any allergies you have.  All medicines you are taking, including vitamins, herbs, eye drops, creams, and over-the-counter medicines.  Any problems you or family members have had with anesthetic medicines.  Any blood disorders you have.  Any surgeries you have had.  Any medical conditions you have.  Whether you are pregnant or may be pregnant. What are the risks? Generally, this is a safe procedure. However, problems may occur,  including:  Swelling, bleeding, or bruising.  Infection.  Blood clots.  Damage to other structures or organs, such as nerves, blood vessels, or the heart.  Allergic reactions to medicines used during the procedure.  What happens before the procedure? Staying hydrated Follow instructions from your health care provider about hydration, which may include:  Up to 2 hours before the procedure - you may continue to drink clear liquids, such as water, clear fruit juice, black coffee, and plain tea.  Eating and drinking restrictions Follow instructions from your health care provider about eating and drinking, which may include:  8 hours before the procedure - stop eating heavy meals or foods such as meat, fried foods, or fatty foods.  6 hours before the procedure - stop eating light meals or foods, such as toast or cereal.  6 hours before the procedure - stop drinking milk or drinks that contain milk.  2 hours before the procedure - stop drinking clear liquids.  Medicine Ask your health care provider about:  Changing or stopping your normal medicines. This is important if you take diabetes medicines or blood thinners.  Taking medicines such as aspirin and ibuprofen. These medicines can thin your blood. Do not take these medicines before your procedure if your doctor tells you not to.  Tests  You may have blood tests.  You may have a test to check the electrical signals in your heart (electrocardiogram, ECG).  You may have imaging tests, such as a chest X-ray. General instructions  For 24 hours before the procedure, stop using products that contain nicotine or tobacco, such as cigarettes and e-cigarettes. If you need help  quitting, ask your health care provider.  Plan to have someone take you home from the hospital or clinic.  You may be asked to shower with a germ-killing soap. What happens during the procedure?  To reduce your risk of infection: ? Your health care team  will wash or sanitize their hands. ? Your skin will be washed with soap. ? Hair may be removed from the surgical area.  Small monitors will be put on your body. They will be used to check your heart, blood pressure, and oxygen level.  An IV tube will be inserted into one of your veins.  You will be given one or more of the following: ? A medicine to help you relax (sedative). ? A medicine to numb the area (local anesthetic). ? A medicine to make you fall asleep (general anesthetic).  Leads will be guided through a blood vessel into your heart and attached to your heart muscles. Depending on the ICD, the leads may go into one ventricle or they may go into both ventricles and into an upper chamber of the heart. An X-ray machine (fluoroscope) will be usedto help guide the leads.  A small incision will be made to create a deep pocket under your skin.  The pulse generator will be placed into the pocket.  The ICD will be tested.  The incision will be closed with stitches (sutures), skin glue, or staples.  A bandage (dressing) will be placed over the incision. This procedure may vary among health care providers and hospitals. What happens after the procedure?  Your blood pressure, heart rate, breathing rate, and blood oxygen level will be monitored often until the medicines you were given have worn off.  A chest X-ray will be taken to check that the ICD is in the right place.  You will need to stay in the hospital for 1-2 days so your health care provider can make sure your ICD is working.  Do not drive for 24 hours if you received a sedative. Ask your health care provider when it is safe for you to drive.  You may be given an identification card explaining that you have an ICD. Summary  An implantable cardioverter defibrillator (ICD) is a small device that is placed under the skin in the chest or abdomen. It is used to detect and correct dangerous irregular heartbeats  (arrhythmias).  An ICD consists of a battery, a small computer (pulse generator), and wires (leads) that go into the heart.  When an ICD detects rapid heart rhythm (tachycardia), it sends a low-energy shock to the heart to restore the heartbeat to normal (cardioversion). If cardioversion does not work or if the ICD detects uncoordinated heart contractions (fibrillation), it delivers a high-energy shock to the heart (defibrillation) to restart the heart.  You will need to stay in the hospital for 1-2 days to make sure your ICD is working. This information is not intended to replace advice given to you by your health care provider. Make sure you discuss any questions you have with your health care provider. Document Released: 12/25/2001 Document Revised: 04/13/2016 Document Reviewed: 04/13/2016 Elsevier Interactive Patient Education  2017 [employer].   Cardiac Ablation Cardiac ablation is a procedure to stop some heart tissue from causing problems. The heart has many electrical connections. Sometimes these connections make the heart beat very fast or irregularly. Removing some problem areas can improve the heart rhythm or make it normal. What happens before the procedure?  Follow instructions from your doctor  about what you cannot eat or drink.  Ask your doctor about: ? Changing or stopping your normal medicines. This is important if you take diabetes medicines or blood thinners. ? Taking medicines such as aspirin and ibuprofen. These medicines can thin your blood. Do not take these medicines before your procedure if your doctor tells you not to.  Plan to have someone take you home.  If you will be going home right after the procedure, plan to have someone with you for 24 hours. What happens during the procedure?  To lower your risk of infection: ? Your health care team will wash or sanitize their hands. ? Your skin will be washed with soap. ? Hair may be removed from your neck or  groin.  An IV tube will be put into one of your veins.  You will be given a medicine to help you relax (sedative).  Skin on your neck or groin will be numbed.  A cut (incision) will be made in your neck or groin.  A needle will be put through your cut and into a vein in your neck or groin.  A tube (catheter) will be put into the needle. The tube will be moved to your heart. X-rays (fluoroscopy) will be used to help guide the tube.  Small devices (electrodes) on the tip of the tube will send out electrical currents.  Dye may be put through the tube. This helps your surgeon see your heart.  Electrical energy will be used to scar (ablate) some heart tissue. Your surgeon may use: ? Heat (radiofrequency energy). ? Laser energy. ? Extreme cold (cryoablation).  The tube will be taken out.  Pressure will be held on your cut. This helps stop bleeding.  A bandage (dressing) will be put on your cut. The procedure may vary. What happens after the procedure?  You will be monitored until your medicines have worn off.  Your cut will be watched for bleeding. You will need to lie still for a few hours.  Do not drive for 24 hours or as long as your doctor tells you. Summary  Cardiac ablation is a procedure to stop some heart tissue from causing problems.  Electrical energy will be used to scar (ablate) some heart tissue. This information is not intended to replace advice given to you by your health care provider. Make sure you discuss any questions you have with your health care provider. Document Released: 12/05/2012 Document Revised: 02/22/2016 Document Reviewed: 02/22/2016 Elsevier Interactive Patient Education  2017 [employer].

## 2017-10-26 DIAGNOSIS — E611 Iron deficiency: Secondary | ICD-10-CM | POA: Diagnosis not present

## 2017-10-26 DIAGNOSIS — N186 End stage renal disease: Secondary | ICD-10-CM | POA: Diagnosis not present

## 2017-10-26 DIAGNOSIS — Z992 Dependence on renal dialysis: Secondary | ICD-10-CM | POA: Diagnosis not present

## 2017-10-26 DIAGNOSIS — N2581 Secondary hyperparathyroidism of renal origin: Secondary | ICD-10-CM | POA: Diagnosis not present

## 2017-10-28 DIAGNOSIS — N2581 Secondary hyperparathyroidism of renal origin: Secondary | ICD-10-CM | POA: Diagnosis not present

## 2017-10-28 DIAGNOSIS — Z992 Dependence on renal dialysis: Secondary | ICD-10-CM | POA: Diagnosis not present

## 2017-10-28 DIAGNOSIS — E611 Iron deficiency: Secondary | ICD-10-CM | POA: Diagnosis not present

## 2017-10-28 DIAGNOSIS — N186 End stage renal disease: Secondary | ICD-10-CM | POA: Diagnosis not present

## 2017-10-31 ENCOUNTER — Ambulatory Visit: Payer: Medicare Other | Admitting: Cardiovascular Disease

## 2017-10-31 ENCOUNTER — Telehealth (HOSPITAL_COMMUNITY): Payer: Self-pay | Admitting: *Deleted

## 2017-10-31 DIAGNOSIS — E785 Hyperlipidemia, unspecified: Secondary | ICD-10-CM

## 2017-10-31 DIAGNOSIS — N186 End stage renal disease: Secondary | ICD-10-CM | POA: Diagnosis not present

## 2017-10-31 DIAGNOSIS — E611 Iron deficiency: Secondary | ICD-10-CM | POA: Diagnosis not present

## 2017-10-31 DIAGNOSIS — N2581 Secondary hyperparathyroidism of renal origin: Secondary | ICD-10-CM | POA: Diagnosis not present

## 2017-10-31 DIAGNOSIS — Z992 Dependence on renal dialysis: Secondary | ICD-10-CM | POA: Diagnosis not present

## 2017-10-31 DIAGNOSIS — I2510 Atherosclerotic heart disease of native coronary artery without angina pectoris: Secondary | ICD-10-CM

## 2017-10-31 MED ORDER — ATORVASTATIN CALCIUM 80 MG PO TABS
80.0000 mg | ORAL_TABLET | Freq: Every day | ORAL | 3 refills | Status: AC
Start: 2017-10-31 — End: ?

## 2017-10-31 NOTE — Telephone Encounter (Signed)
Result Notes for TSH   Notes recorded by Darron Doom, RN on 10/31/2017 at 2:22 PM EDT Patient's wife called back and she is agreeable with plan. Medication sent to pharmacy, labs ordered, and lab appointment scheduled. ------  Notes recorded by Shirley Muscat, RN on 10/30/2017 at 4:33 PM EDT Left message to call back   ------  Notes recorded by Larey Dresser, MD on 10/29/2017 at 11:53 AM EDT LDL above goal of < 70. Increase atorvastatin to 80 mg daily with lipids/lFTs in 2 months. ------  Notes recorded by Scarlette Calico, RN on 10/27/2017 at 3:08 PM EDT Pt has ESRD, HD on Tue, Thur and Sat, Labs reviewed by RN, will forward to MD for review

## 2017-11-01 ENCOUNTER — Other Ambulatory Visit: Payer: Self-pay

## 2017-11-01 ENCOUNTER — Ambulatory Visit (INDEPENDENT_AMBULATORY_CARE_PROVIDER_SITE_OTHER): Payer: Medicare Other | Admitting: Cardiovascular Disease

## 2017-11-01 ENCOUNTER — Encounter: Payer: Self-pay | Admitting: Cardiovascular Disease

## 2017-11-01 VITALS — BP 129/80 | HR 79 | Ht 72.0 in | Wt 309.0 lb

## 2017-11-01 DIAGNOSIS — N186 End stage renal disease: Secondary | ICD-10-CM

## 2017-11-01 DIAGNOSIS — Z992 Dependence on renal dialysis: Secondary | ICD-10-CM

## 2017-11-01 DIAGNOSIS — I471 Supraventricular tachycardia: Secondary | ICD-10-CM | POA: Diagnosis not present

## 2017-11-01 DIAGNOSIS — I2510 Atherosclerotic heart disease of native coronary artery without angina pectoris: Secondary | ICD-10-CM

## 2017-11-01 DIAGNOSIS — I38 Endocarditis, valve unspecified: Secondary | ICD-10-CM | POA: Diagnosis not present

## 2017-11-01 DIAGNOSIS — I5022 Chronic systolic (congestive) heart failure: Secondary | ICD-10-CM | POA: Diagnosis not present

## 2017-11-01 DIAGNOSIS — I429 Cardiomyopathy, unspecified: Secondary | ICD-10-CM | POA: Diagnosis not present

## 2017-11-01 DIAGNOSIS — I480 Paroxysmal atrial fibrillation: Secondary | ICD-10-CM

## 2017-11-01 DIAGNOSIS — Z954 Presence of other heart-valve replacement: Secondary | ICD-10-CM | POA: Diagnosis not present

## 2017-11-01 DIAGNOSIS — I25708 Atherosclerosis of coronary artery bypass graft(s), unspecified, with other forms of angina pectoris: Secondary | ICD-10-CM

## 2017-11-01 DIAGNOSIS — Z951 Presence of aortocoronary bypass graft: Secondary | ICD-10-CM

## 2017-11-01 MED ORDER — METOPROLOL TARTRATE 25 MG PO TABS: 25.0000 mg | ORAL_TABLET | Freq: Two times a day (BID) | ORAL | 3 refills | Status: DC

## 2017-11-01 NOTE — Progress Notes (Signed)
SUBJECTIVE: The patient presents for routine follow-up.  He saw Dr. Aundra Dubin earlier this month.  Echocardiogram on 10/25/2017 showed EF 30% with moderate left ventricular dilatation, mildly decreased right ventricular systolic function, normal mechanical aortic valve function, and mild mitral regurgitation.  He underwent mechanical bileaflet aortic valve replacement and single-vessel CABG on 09/01/2017.    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  He has orthostatic hypotension and end-stage renal disease on hemodialysis.  I was called by Ramiro Harvest, and the nephrology PA on 09/26/2017 as the patient was tachycardic during dialysis with heart rates in the 120-130 bpm range.  He was asymptomatic.  Nephrology attempted to dialyze a smaller volume at that time to see if it would improve his heart rate but it did not.  At that time I recommended a trial of low-dose metoprolol 12.5 mg twice daily.  He is doing well today and denies chest pain and palpitations.  Chronic exertional dyspnea is stable.   Review of Systems: As per "subjective", otherwise negative.  No Known Allergies  Current Outpatient Medications  Medication Sig Dispense Refill  . amiodarone (PACERONE) 200 MG tablet Take 200 mg by mouth daily.    [name] aspirin EC 81 MG EC tablet Take 1 tablet (81 mg total) by mouth daily.    [name] atorvastatin (LIPITOR) 80 MG tablet Take 1 tablet (80 mg total) by mouth daily at 6 PM. 90 tablet 3  . B Complex-C-Folic Acid (NEPHRO-VITE PO) Take 1 tablet by mouth daily.     . chlorhexidine (PERIDEX) 0.12 % solution Rinse with 15 mls twice daily for 30 seconds. Use after breakfast and at bedtime. Spit out excess. Do not swallow. (Patient taking differently: Use as directed 15 mLs in the mouth or throat 4 (four) times daily. For 30 seconds. Use after breakfast and at bedtime. Spit out excess. Do not swallow.) 960 mL prn  . FOSRENOL 500 MG chewable tablet Chew 1,500-2,000 mg by mouth See admin  instructions. Take 2000 mg by mouth with meals and take 1500 mg by mouth with snacks    . midodrine (PROAMATINE) 5 MG tablet Take 3 tablets (15 mg total) by mouth 3 (three) times daily with meals. 90 tablet 2  . oxyCODONE (OXY IR/ROXICODONE) 5 MG immediate release tablet Take 1 tablet (5 mg total) by mouth every 4 (four) hours as needed for severe pain. 30 tablet 0  . warfarin (COUMADIN) 5 MG tablet Take 1 tablet daily except 1/2 tablet on Tuesdays, Thursdays and Saturdays or as directed by coumadin clinic (Patient taking differently: Take 5 mg by mouth daily. ) 90 tablet 1   No current facility-administered medications for this visit.     Past Medical History:  Diagnosis Date  . Aneurysm ([place])     Right arm fistula 3 aneurysms 2011,   plans to have a new procedure  . Angina   . Chest discomfort   . CHF (congestive heart failure) ([place])   . Coronary artery disease   . Coronary artery disease involving native coronary artery of native heart without angina pectoris   . Dialysis patient ([place])   . Dysrhythmia   . ESRD (end stage renal disease) ([place])    on hemodialysis T_T_S  . Hypertension   . Leg pain   . Mitral regurgitation   . Morbid obesity ([place])   . Orthostatic hypotension   . Overweight(278.02)   . Renal insufficiency   . S/P aortic valve replacement with bileaflet mechanical valve  09/01/2017   25 mm Sorin [employer] [place] bileaflet mechanical valve  . 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 Severe aortic insufficiency   . Sinus tachycardia   . Syncope     positional after dialysis... 2008    Past Surgical History:  Procedure Laterality Date  . A/V FISTULAGRAM Left 06/30/2016   Procedure: A/V Fistulagram;  Surgeon: Algernon Huxley, MD;  Location: [place] [facility];  Service: Cardiovascular;  Laterality: Left;  . AORTIC VALVE REPLACEMENT N/A 09/01/2017   Procedure: AORTIC VALVE REPLACEMENT (AVR);  Surgeon: [name], MD;  Location: [place];  Service: Open Heart Surgery;   Laterality: N/A;  . ARTERIOVENOUS GRAFT PLACEMENT    . AV FISTULA PLACEMENT    . AV FISTULA PLACEMENT Left 11/18/2015   Procedure: ARTERIOVENOUS (AV) FISTULA CREATION ( RADIOCEPHALIC );  Surgeon: Algernon Huxley, MD;  Location: [facility];  Service: Vascular;  Laterality: Left;  . AV FISTULA PLACEMENT Left 03/23/2016   Procedure: ARTERIOVENOUS (AV) FISTULA CREATION ( REVISION );  Surgeon: Algernon Huxley, MD;  Location: [facility];  Service: Vascular;  Laterality: Left;  . [place] REMOVAL Left 03/23/2016   Procedure: REMOVAL OF ARTERIOVENOUS GORETEX GRAFT ([place]);  Surgeon: Algernon Huxley, MD;  Location: [facility];  Service: Vascular;  Laterality: Left;  . CENTRAL LINE INSERTION Right 09/01/2017   Procedure: FLOROSCOPY GUIDED PLACEMENT OF RIGHT FEMORAL CENTRAL LINE TIMES TWO  AND PLACEMENT OF SWAN GANZ CATHETER;  Surgeon: [name], MD;  Location: [place];  Service: Open Heart Surgery;  Laterality: Right;  . CORONARY ARTERY BYPASS GRAFT N/A 09/01/2017   Procedure: CORONARY ARTERY BYPASS GRAFTING (CABG) X 1, USING RIGHT GREATER SAPHENOUS VEIN HARVESTED ENDOSCOPICALLY;  Surgeon: [name], MD;  Location: [place];  Service: Open Heart Surgery;  Laterality: N/A;  . DIALYSIS/PERMA CATHETER REMOVAL N/A 08/01/2016   Procedure: Dialysis/Perma Catheter Removal;  Surgeon: Algernon Huxley, MD;  Location: [place] [facility];  Service: Cardiovascular;  Laterality: N/A;  . HERNIA REPAIR    . INSERTION OF DIALYSIS CATHETER  03/01/2011   Procedure: INSERTION OF DIALYSIS CATHETER;  Surgeon: Elam Dutch, MD;  Location: [place];  Service: Vascular;  Laterality: Left;  Exchange of Dialysis Catheter to 27cm 15Fr. Arrow Catheter  . INSERTION OF DIALYSIS CATHETER  09/01/2017   Procedure: PLACEMENT OF TRIALYSIS SHORT TERM DIALYSIS CATHETER;  Surgeon: [name], MD;  Location: [facility];  Service: Open Heart Surgery;;  . MULTIPLE EXTRACTIONS WITH ALVEOLOPLASTY N/A 08/07/2017   Procedure: Extraction of tooth #2, 7,8,13,14,15,17, and 31  with alveoloplasty and gross debridement of remaining teeth;  Surgeon: [name], DDS;  Location: [place];  Service: Oral Surgery;  Laterality: N/A;  . PERIPHERAL VASCULAR CATHETERIZATION N/A 09/01/2014   Procedure: A/V Shuntogram/Fistulagram;  Surgeon: Algernon Huxley, MD;  Location: [place] [facility];  Service: Cardiovascular;  Laterality: N/A;  . PERIPHERAL VASCULAR CATHETERIZATION Right 09/01/2014   Procedure: Thrombectomy;  Surgeon: Algernon Huxley, MD;  Location: [place] [facility];  Service: Cardiovascular;  Laterality: Right;  . PERIPHERAL VASCULAR CATHETERIZATION Right 09/01/2014   Procedure: A/V Shunt Intervention;  Surgeon: Algernon Huxley, MD;  Location: [place] [facility];  Service: Cardiovascular;  Laterality: Right;  . PERIPHERAL VASCULAR CATHETERIZATION N/A 09/17/2014   Procedure: A/V Shuntogram/Fistulagram;  Surgeon: Algernon Huxley, MD;  Location: [place] [facility];  Service: Cardiovascular;  Laterality: N/A;  . PERIPHERAL VASCULAR CATHETERIZATION Right 10/17/2014  Procedure: Thrombectomy;  Surgeon: Katha Cabal, MD;  Location: [place] [facility];  Service: Cardiovascular;  Laterality: Right;  . PERIPHERAL VASCULAR CATHETERIZATION N/A 10/17/2014   Procedure: A/V Shuntogram/Fistulagram;  Surgeon: Katha Cabal, MD;  Location: [place] [facility];  Service: Cardiovascular;  Laterality: N/A;  . PERIPHERAL VASCULAR CATHETERIZATION N/A 10/17/2014   Procedure: A/V Shunt Intervention;  Surgeon: Katha Cabal, MD;  Location: [place] [facility];  Service: Cardiovascular;  Laterality: N/A;  . PERIPHERAL VASCULAR CATHETERIZATION Right 11/04/2014   Procedure: Thrombectomy;  Surgeon: Katha Cabal, MD;  Location: [place] [facility];  Service: Cardiovascular;  Laterality: Right;  . PERIPHERAL VASCULAR CATHETERIZATION Left 11/04/2014   Procedure: Visceral Venography;  Surgeon: Katha Cabal, MD;  Location: [place] [facility];  Service: Cardiovascular;  Laterality: Left;  .  PERIPHERAL VASCULAR CATHETERIZATION Right 11/27/2014   Procedure: A/V Shuntogram/Fistulagram;  Surgeon: Algernon Huxley, MD;  Location: [place] [facility];  Service: Cardiovascular;  Laterality: Right;  . PERIPHERAL VASCULAR CATHETERIZATION Right 11/27/2014   Procedure: A/V Shunt Intervention;  Surgeon: Algernon Huxley, MD;  Location: [place] [facility];  Service: Cardiovascular;  Laterality: Right;  . PERIPHERAL VASCULAR CATHETERIZATION Right 12/31/2014   Procedure: Thrombectomy;  Surgeon: Algernon Huxley, MD;  Location: [place] [facility];  Service: Cardiovascular;  Laterality: Right;  . PERIPHERAL VASCULAR CATHETERIZATION Right 01/12/2015   Procedure: A/V Shuntogram/Fistulagram;  Surgeon: Algernon Huxley, MD;  Location: [place] [facility];  Service: Cardiovascular;  Laterality: Right;  . PERIPHERAL VASCULAR CATHETERIZATION N/A 01/12/2015   Procedure: A/V Shunt Intervention;  Surgeon: Algernon Huxley, MD;  Location: [place] [facility];  Service: Cardiovascular;  Laterality: N/A;  . PERIPHERAL VASCULAR CATHETERIZATION Right 01/28/2015   Procedure: Thrombectomy;  Surgeon: Algernon Huxley, MD;  Location: [place] [facility];  Service: Cardiovascular;  Laterality: Right;  . PERIPHERAL VASCULAR CATHETERIZATION N/A 06/08/2015   Procedure: A/V Shuntogram/Fistulagram;  Surgeon: Algernon Huxley, MD;  Location: [place] [facility];  Service: Cardiovascular;  Laterality: N/A;  . PERIPHERAL VASCULAR CATHETERIZATION N/A 06/08/2015   Procedure: A/V Shunt Intervention;  Surgeon: Algernon Huxley, MD;  Location: [place] [facility];  Service: Cardiovascular;  Laterality: N/A;  . PERIPHERAL VASCULAR CATHETERIZATION Right 07/06/2015   Procedure: A/V Shuntogram/Fistulagram;  Surgeon: Algernon Huxley, MD;  Location: [place] [facility];  Service: Cardiovascular;  Laterality: Right;  . PERIPHERAL VASCULAR CATHETERIZATION N/A 07/06/2015   Procedure: A/V Shunt Intervention;  Surgeon: Algernon Huxley, MD;  Location: [place] [facility];  Service:  Cardiovascular;  Laterality: N/A;  . PERIPHERAL VASCULAR CATHETERIZATION N/A 08/04/2015   Procedure: graft declot;  Surgeon: Katha Cabal, MD;  Location: [place] [facility];  Service: Cardiovascular;  Laterality: N/A;  . PERIPHERAL VASCULAR CATHETERIZATION Right 08/25/2015   Procedure: A/V Shuntogram/Fistulagram;  Surgeon: Katha Cabal, MD;  Location: [place] [facility];  Service: Cardiovascular;  Laterality: Right;  . PERIPHERAL VASCULAR CATHETERIZATION N/A 11/04/2015   Procedure: Dialysis/Perma Catheter Insertion;  Surgeon: Algernon Huxley, MD;  Location: [place] [facility];  Service: Cardiovascular;  Laterality: N/A;  . PERIPHERAL VASCULAR CATHETERIZATION Left 12/14/2015   Procedure: A/V Shuntogram/Fistulagram;  Surgeon: Algernon Huxley, MD;  Location: [place] [facility];  Service: Cardiovascular;  Laterality: Left;  . PERIPHERAL VASCULAR CATHETERIZATION N/A 12/14/2015   Procedure: A/V Shunt Intervention;  Surgeon: Algernon Huxley, MD;  Location: [place] [facility];  Service: Cardiovascular;  Laterality: N/A;  . PERIPHERAL VASCULAR  CATHETERIZATION N/A 12/17/2015   Procedure: Dialysis/Perma Catheter Insertion;  Surgeon: Algernon Huxley, MD;  Location: [place] [facility];  Service: Cardiovascular;  Laterality: N/A;  . PERIPHERAL VASCULAR CATHETERIZATION Left 12/22/2015   Procedure: Dialysis/Perma Catheter Insertion;  Surgeon: Katha Cabal, MD;  Location: [place] [facility];  Service: Cardiovascular;  Laterality: Left;  . PERIPHERAL VASCULAR CATHETERIZATION Left 02/29/2016   Procedure: A/V Shuntogram/Fistulagram;  Surgeon: Algernon Huxley, MD;  Location: [place] [facility];  Service: Cardiovascular;  Laterality: Left;  . PERIPHERAL VASCULAR CATHETERIZATION N/A 02/29/2016   Procedure: A/V Shunt Intervention;  Surgeon: Algernon Huxley, MD;  Location: [place] [facility];  Service: Cardiovascular;  Laterality: N/A;  . right arm graft     for dyalisis  . RIGHT/LEFT HEART CATH AND CORONARY ANGIOGRAPHY  N/A 08/02/2017   Procedure: RIGHT/LEFT HEART CATH AND CORONARY ANGIOGRAPHY;  Surgeon: Larey Dresser, MD;  Location: [place] [facility];  Service: Cardiovascular;  Laterality: N/A;  . TEE WITHOUT CARDIOVERSION N/A 07/31/2017   Procedure: TRANSESOPHAGEAL ECHOCARDIOGRAM (TEE);  Surgeon: Josue Hector, MD;  Location: [facility];  Service: Cardiovascular;  Laterality: N/A;  . TEE WITHOUT CARDIOVERSION N/A 09/01/2017   Procedure: TRANSESOPHAGEAL ECHOCARDIOGRAM (TEE);  Surgeon: [name], MD;  Location: [place];  Service: Open Heart Surgery;  Laterality: N/A;  . THROMBECTOMY    . THROMBECTOMY W/ EMBOLECTOMY  03/01/2011   Procedure: THROMBECTOMY ARTERIOVENOUS GORE-TEX GRAFT;  Surgeon: Elam Dutch, MD;  Location: [place];  Service: Vascular;  Laterality: Right;  Attempted Thrombectomy of Old  Right Upper Arm Arteriovenous gortex Graft. Insertion of new Arteriovenous Graft using 71mm x 50cm Gortex Stretch graft.   . THROMBECTOMY W/ EMBOLECTOMY  07/11/2011   Procedure: THROMBECTOMY ARTERIOVENOUS GORE-TEX GRAFT;  Surgeon: Rosetta Posner, MD;  Location: [place];  Service: Vascular;  Laterality: Right;  . UMBILICAL HERNIA REPAIR    . VENOGRAM N/A 08/23/2011   Procedure: VENOGRAM;  Surgeon: [name], MD;  [facility]: [facility];  Service: Cardiovascular;  Laterality: N/A;    Social History   Socioeconomic History  . Marital status: Married    Spouse name: Not on file  . Number of children: Not on file  . Years of education: Not on file  . Highest education level: Not on file  Occupational History  . Occupation: disabled  Social Needs  . Financial resource strain: Not on file  . Food insecurity:    Worry: Not on file    Inability: Not on file  . Transportation needs:    Medical: Not on file    Non-medical: Not on file  Tobacco Use  . Smoking status: Former Smoker    Types: Cigarettes    Last attempt to quit: 04/18/1994    Years since quitting: 23.5  . Smokeless tobacco: Current User     Types: Snuff  . Tobacco comment: dips 1/2 snuff per day times 25 years  Substance and Sexual Activity  . Alcohol use: No  . Drug use: No  . Sexual activity: Not on file  Lifestyle  . Physical activity:    Days per week: Not on file    Minutes per session: Not on file  . Stress: Not on file  Relationships  . Social connections:    Talks on phone: Not on file    Gets together: Not on file    Attends religious service: Not on file    Active member of club or organization: Not on file  Attends meetings of clubs or organizations: Not on file    Relationship status: Not on file  . Intimate partner violence:    Fear of current or ex partner: Not on file    Emotionally abused: Not on file    Physically abused: Not on file    Forced sexual activity: Not on file  Other Topics Concern  . Not on file  Social History Narrative  . Not on file     Vitals:   11/01/17 1125  BP: 129/80  Pulse: 79  SpO2: 97%  Weight: (!) 309 lb (140.2 kg)  Height: 6' (1.829 m)    Wt Readings from Last 3 Encounters:  11/01/17 (!) 309 lb (140.2 kg)  10/25/17 (!) 306 lb 4 oz (138.9 kg)  10/23/17 (!) 307 lb (139.3 kg)     PHYSICAL EXAM General: NAD HEENT: Normal. Neck: No JVD, no thyromegaly. Lungs: Clear to auscultation bilaterally with normal respiratory effort. CV: Regular rate and rhythm, normal S1/mechanical S2, no I7/O6, 2/6 systolic murmur over right upper sternal border. No pretibial or periankle edema.  No carotid bruit.   Abdomen: Soft, nontender, no distention.  Neurologic: Alert and oriented.  Psych: Normal affect. Skin: Normal. Musculoskeletal: No gross deformities.    ECG: Reviewed above under Subjective   Labs: Lab Results  Component Value Date/Time   K 4.4 10/25/2017 11:14 AM   K 4.1 11/13/2013 04:08 AM   BUN 33 (H) 10/25/2017 11:14 AM   BUN 68 (H) 11/13/2013 04:08 AM   CREATININE 8.38 (H) 10/25/2017 11:14 AM   CREATININE 16.45 (H) 11/13/2013 04:08 AM   ALT 12  10/25/2017 11:14 AM   ALT < 6 (L) 05/16/2013 06:16 AM   TSH 1.819 10/25/2017 11:14 AM   HGB 10.5 (L) 09/23/2017 09:47 AM   HGB 11.3 (L) 11/13/2013 04:08 AM     Lipids: Lab Results  Component Value Date/Time   LDLCALC 118 (H) 10/25/2017 11:14 AM   LDLCALC 115 (H) 05/16/2013 06:16 AM   CHOL 180 10/25/2017 11:14 AM   CHOL 169 05/16/2013 06:16 AM   TRIG 89 10/25/2017 11:14 AM   TRIG 97 05/16/2013 06:16 AM   HDL 44 10/25/2017 11:14 AM   HDL 35 (L) 05/16/2013 06:16 AM       ASSESSMENT AND PLAN: 1.  Chronic systolic heart failure/cardiomyopathy: Symptomatically stable.    Multifactorial in etiology.  Volume management as per hemodialysis.    He has been referred to EP for ICD given continued severely reduced LVEF, 30% on 10/25/2017.  He does not qualify for CRT given narrow QRS.  Continue low-dose metoprolol.  2.  Coronary artery disease status post one-vessel CABG: Symptomatically stable.  Continue aspirin and statin.   3.  Paroxysmal atrial fibrillation: Currently on oral amiodarone and systemically anticoagulated with warfarin.  He will need routine TSH and LFT monitoring as well as lung function testing and eye exams.  4.  Status post mechanical bileaflet aortic valve replacement: Currently on warfarin.    Aortic valve was functioning normally by echo on 10/25/2017.  Continue to keep INR between 2-3.  He will need antibiotic prophylaxis with dental work.  5.  End-stage renal disease on hemodialysis.  As he becomes hypotensive, he is currently on midodrine.  6.  Paroxysmal supraventricular tachycardia: Symptomatically improved with the addition of low-dose metoprolol tartrate 12.5 mg twice daily on 09/26/2017.  He is also on amiodarone.  I previously made an EP referral to consider an ablation as this appears to be  recurrent and rapid.  7.  Valvular heart disease: TEE on 09/01/2017 demonstrated severe aortic valve regurgitation for which he underwent aortic valve replacement.  He also  had moderate leaflet thickening and calcification with mild mitral annular calcification and moderate mitral regurgitation.  Follow-up echocardiogram on 10/25/2017 showed a normally functioning aortic valve.     Disposition: Follow up with Dr. Lovena Le on 11/06/2017.  Follow-up with Dr. Aundra Dubin as previously scheduled.  Follow-up with me as needed.   Kate Sable, M.D., F.A.C.C.

## 2017-11-01 NOTE — Patient Instructions (Signed)
Your physician recommends that you schedule a follow-up appointment in: AS NEEDED WITH DR KONESWARAN  Your physician recommends that you continue on your current medications as directed. Please refer to the Current Medication list given to you today.  Thank you for choosing [facility]!!    

## 2017-11-03 ENCOUNTER — Ambulatory Visit (INDEPENDENT_AMBULATORY_CARE_PROVIDER_SITE_OTHER): Payer: Medicare Other | Admitting: *Deleted

## 2017-11-03 DIAGNOSIS — T82868D Thrombosis of vascular prosthetic devices, implants and grafts, subsequent encounter: Secondary | ICD-10-CM

## 2017-11-03 DIAGNOSIS — Z5181 Encounter for therapeutic drug level monitoring: Secondary | ICD-10-CM | POA: Diagnosis not present

## 2017-11-03 DIAGNOSIS — Z954 Presence of other heart-valve replacement: Secondary | ICD-10-CM | POA: Diagnosis not present

## 2017-11-03 LAB — POCT INR: INR: 3.8 — AB (ref 2.0–3.0)

## 2017-11-03 NOTE — Patient Instructions (Signed)
Hold coumadin tonight then decrease dose to 1 tablet daily  Amiodarone decreased to 200 mg daily on 6/7 Recheck in 2 weeks

## 2017-11-06 ENCOUNTER — Encounter: Payer: Self-pay | Admitting: Internal Medicine

## 2017-11-06 ENCOUNTER — Ambulatory Visit (INDEPENDENT_AMBULATORY_CARE_PROVIDER_SITE_OTHER): Payer: Medicare Other | Admitting: Internal Medicine

## 2017-11-06 VITALS — BP 130/80 | HR 77 | Ht 72.0 in | Wt 314.6 lb

## 2017-11-06 DIAGNOSIS — I5022 Chronic systolic (congestive) heart failure: Secondary | ICD-10-CM

## 2017-11-06 DIAGNOSIS — Z01812 Encounter for preprocedural laboratory examination: Secondary | ICD-10-CM | POA: Diagnosis not present

## 2017-11-06 NOTE — Progress Notes (Signed)
HPI Mr. Pound is referred today by Dr. Jacinta Shoe to discuss insertion of an ICD for primary prevention of malignant ventricular arrhythmias. He is a pleasant 52 yo man with ESRD on HD. He developed aortic valve disease and underwent AVR a couple of months ago. He did very well postop. He denies chest pain or sob. He has not had syncope. No edema. He is not having symptoms during HD.  No Known Allergies   Current Outpatient Medications  Medication Sig Dispense Refill  . amiodarone (PACERONE) 200 MG tablet Take 200 mg by mouth daily.    [name] aspirin EC 81 MG EC tablet Take 1 tablet (81 mg total) by mouth daily.    [name] atorvastatin (LIPITOR) 80 MG tablet Take 1 tablet (80 mg total) by mouth daily at 6 PM. 90 tablet 3  . B Complex-C-Folic Acid (NEPHRO-VITE PO) Take 1 tablet by mouth daily.     . chlorhexidine (PERIDEX) 0.12 % solution Rinse with 15 mls twice daily for 30 seconds. Use after breakfast and at bedtime. Spit out excess. Do not swallow. (Patient taking differently: Use as directed 15 mLs in the mouth or throat 4 (four) times daily. For 30 seconds. Use after breakfast and at bedtime. Spit out excess. Do not swallow.) 960 mL prn  . FOSRENOL 500 MG chewable tablet Chew 1,500-2,000 mg by mouth See admin instructions. Take 2000 mg by mouth with meals and take 1500 mg by mouth with snacks    . metoprolol tartrate (LOPRESSOR) 25 MG tablet Take 1 tablet (25 mg total) by mouth 2 (two) times daily. 60 tablet 3  . midodrine (PROAMATINE) 5 MG tablet Take 3 tablets (15 mg total) by mouth 3 (three) times daily with meals. 90 tablet 2  . oxyCODONE (OXY IR/ROXICODONE) 5 MG immediate release tablet Take 1 tablet (5 mg total) by mouth every 4 (four) hours as needed for severe pain. 30 tablet 0  . warfarin (COUMADIN) 5 MG tablet Take 1 tablet daily except 1/2 tablet on Tuesdays, Thursdays and Saturdays or as directed by coumadin clinic (Patient taking differently: Take 5 mg by mouth daily. ) 90 tablet 1     No current facility-administered medications for this visit.      Past Medical History:  Diagnosis Date  . Aneurysm ([place])     Right arm fistula 3 aneurysms 2011,   plans to have a new procedure  . Angina   . Chest discomfort   . CHF (congestive heart failure) ([place])   . Coronary artery disease   . Coronary artery disease involving native coronary artery of native heart without angina pectoris   . Dialysis patient ([place])   . Dysrhythmia   . ESRD (end stage renal disease) ([place])    on hemodialysis T_T_S  . Hypertension   . Leg pain   . Mitral regurgitation   . Morbid obesity ([place])   . Orthostatic hypotension   . Overweight(278.02)   . Renal insufficiency   . 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 Severe aortic insufficiency   . Sinus tachycardia   . Syncope     positional after dialysis... 2008    ROS:   All systems reviewed and negative except as noted in the HPI.   Past Surgical History:  Procedure Laterality Date  . A/V FISTULAGRAM Left 06/30/2016  Procedure: A/V Fistulagram;  Surgeon: Algernon Huxley, MD;  Location: [facility];  Service: Cardiovascular;  Laterality: Left;  . AORTIC VALVE REPLACEMENT N/A 09/01/2017   Procedure: AORTIC VALVE REPLACEMENT (AVR);  Surgeon: [name], MD;  Location: [facility];  Service: Open Heart Surgery;  Laterality: N/A;  . ARTERIOVENOUS GRAFT PLACEMENT    . AV FISTULA PLACEMENT    . AV FISTULA PLACEMENT Left 11/18/2015   Procedure: ARTERIOVENOUS (AV) FISTULA CREATION ( RADIOCEPHALIC );  Surgeon: Algernon Huxley, MD;  Location: [facility];  Service: Vascular;  Laterality: Left;  . AV FISTULA PLACEMENT Left 03/23/2016   Procedure: ARTERIOVENOUS (AV) FISTULA CREATION ( REVISION );  Surgeon: Algernon Huxley, MD;  Location: [facility];  Service: Vascular;  Laterality: Left;  . [place] REMOVAL Left 03/23/2016    Procedure: REMOVAL OF ARTERIOVENOUS GORETEX GRAFT ([place]);  Surgeon: Algernon Huxley, MD;  Location: [facility];  Service: Vascular;  Laterality: Left;  . CENTRAL LINE INSERTION Right 09/01/2017   Procedure: FLOROSCOPY GUIDED PLACEMENT OF RIGHT FEMORAL CENTRAL LINE TIMES TWO  AND PLACEMENT OF SWAN GANZ CATHETER;  Surgeon: [name], MD;  Location: [facility];  Service: Open Heart Surgery;  Laterality: Right;  . CORONARY ARTERY BYPASS GRAFT N/A 09/01/2017   Procedure: CORONARY ARTERY BYPASS GRAFTING (CABG) X 1, USING RIGHT GREATER SAPHENOUS VEIN HARVESTED ENDOSCOPICALLY;  Surgeon: [name], MD;  Location: [place];  Service: Open Heart Surgery;  Laterality: N/A;  . DIALYSIS/PERMA CATHETER REMOVAL N/A 08/01/2016   Procedure: Dialysis/Perma Catheter Removal;  Surgeon: Algernon Huxley, MD;  Location: [facility];  Service: Cardiovascular;  Laterality: N/A;  . HERNIA REPAIR    . INSERTION OF DIALYSIS CATHETER  03/01/2011   Procedure: INSERTION OF DIALYSIS CATHETER;  Surgeon: Elam Dutch, MD;  Location: [facility];  Service: Vascular;  Laterality: Left;  Exchange of Dialysis Catheter to 27cm 15Fr. Arrow Catheter  . INSERTION OF DIALYSIS CATHETER  09/01/2017   Procedure: PLACEMENT OF TRIALYSIS SHORT TERM DIALYSIS CATHETER;  Surgeon: [name], MD;  Location: [facility];  Service: Open Heart Surgery;;  . MULTIPLE EXTRACTIONS WITH ALVEOLOPLASTY N/A 08/07/2017   Procedure: Extraction of tooth #2, 7,8,13,14,15,17, and 31 with alveoloplasty and gross debridement of remaining teeth;  Surgeon: [name], DDS;  Location: [facility];  Service: Oral Surgery;  Laterality: N/A;  . PERIPHERAL VASCULAR CATHETERIZATION N/A 09/01/2014   Procedure: A/V Shuntogram/Fistulagram;  Surgeon: Algernon Huxley, MD;  Location: [facility];  Service: Cardiovascular;  Laterality: N/A;  . PERIPHERAL VASCULAR CATHETERIZATION Right 09/01/2014   Procedure: Thrombectomy;  Surgeon: Algernon Huxley, MD;  Location: [facility];   Service: Cardiovascular;  Laterality: Right;  . PERIPHERAL VASCULAR CATHETERIZATION Right 09/01/2014   Procedure: A/V Shunt Intervention;  Surgeon: Algernon Huxley, MD;  Location: [facility];  Service: Cardiovascular;  Laterality: Right;  . PERIPHERAL VASCULAR CATHETERIZATION N/A 09/17/2014   Procedure: A/V Shuntogram/Fistulagram;  Surgeon: Algernon Huxley, MD;  Location: [facility];  Service: Cardiovascular;  Laterality: N/A;  . PERIPHERAL VASCULAR CATHETERIZATION Right 10/17/2014   Procedure: Thrombectomy;  Surgeon: Katha Cabal, MD;  Location: [facility];  Service: Cardiovascular;  Laterality: Right;  . PERIPHERAL VASCULAR CATHETERIZATION N/A 10/17/2014   Procedure: A/V Shuntogram/Fistulagram;  Surgeon: Katha Cabal, MD;  Location: [facility];  Service: Cardiovascular;  Laterality: N/A;  . PERIPHERAL VASCULAR CATHETERIZATION N/A 10/17/2014   Procedure: A/V Shunt Intervention;  Surgeon: Katha Cabal, MD;  Location: [facility];  Service: Cardiovascular;  Laterality: N/A;  . PERIPHERAL VASCULAR CATHETERIZATION Right 11/04/2014   Procedure: Thrombectomy;  Surgeon: Katha Cabal, MD;  Location: [facility];  Service: Cardiovascular;  Laterality: Right;  . PERIPHERAL VASCULAR CATHETERIZATION Left 11/04/2014   Procedure: Visceral Venography;  Surgeon: Katha Cabal, MD;  Location: [facility];  Service: Cardiovascular;  Laterality: Left;  . PERIPHERAL VASCULAR CATHETERIZATION Right 11/27/2014   Procedure: A/V Shuntogram/Fistulagram;  Surgeon: Algernon Huxley, MD;  Location: [facility];  Service: Cardiovascular;  Laterality: Right;  . PERIPHERAL VASCULAR CATHETERIZATION Right 11/27/2014   Procedure: A/V Shunt Intervention;  Surgeon: Algernon Huxley, MD;  Location: [facility];  Service: Cardiovascular;  Laterality: Right;  . PERIPHERAL VASCULAR CATHETERIZATION Right 12/31/2014   Procedure: Thrombectomy;  Surgeon: Algernon Huxley, MD;  Location: [facility];  Service: Cardiovascular;  Laterality: Right;  . PERIPHERAL VASCULAR CATHETERIZATION Right 01/12/2015   Procedure: A/V Shuntogram/Fistulagram;  Surgeon: Algernon Huxley, MD;  Location: [facility];  Service: Cardiovascular;  Laterality: Right;  . PERIPHERAL VASCULAR CATHETERIZATION N/A 01/12/2015   Procedure: A/V Shunt Intervention;  Surgeon: Algernon Huxley, MD;  Location: [place] [facility];  Service: Cardiovascular;  Laterality: N/A;  . PERIPHERAL VASCULAR CATHETERIZATION Right 01/28/2015   Procedure: Thrombectomy;  Surgeon: Algernon Huxley, MD;  Location: [facility];  Service: Cardiovascular;  Laterality: Right;  . PERIPHERAL VASCULAR CATHETERIZATION N/A 06/08/2015   Procedure: A/V Shuntogram/Fistulagram;  Surgeon: Algernon Huxley, MD;  Location: [facility];  Service: Cardiovascular;  Laterality: N/A;  . PERIPHERAL VASCULAR CATHETERIZATION N/A 06/08/2015   Procedure: A/V Shunt Intervention;  Surgeon: Algernon Huxley, MD;  Location: [facility];  Service: Cardiovascular;  Laterality: N/A;  . PERIPHERAL VASCULAR CATHETERIZATION Right 07/06/2015   Procedure: A/V Shuntogram/Fistulagram;  Surgeon: Algernon Huxley, MD;  Location: [facility];  Service: Cardiovascular;  Laterality: Right;  . PERIPHERAL VASCULAR CATHETERIZATION N/A 07/06/2015   Procedure: A/V Shunt Intervention;  Surgeon: Algernon Huxley, MD;  Location: [facility];  Service: Cardiovascular;  Laterality: N/A;  . PERIPHERAL VASCULAR CATHETERIZATION N/A 08/04/2015   Procedure: graft declot;  Surgeon: Katha Cabal, MD;  Location: [facility];  Service: Cardiovascular;  Laterality: N/A;  . PERIPHERAL VASCULAR CATHETERIZATION Right 08/25/2015   Procedure: A/V Shuntogram/Fistulagram;  Surgeon: Katha Cabal, MD;  Location: [facility];  Service: Cardiovascular;  Laterality: Right;  . PERIPHERAL VASCULAR CATHETERIZATION N/A 11/04/2015   Procedure:  Dialysis/Perma Catheter Insertion;  Surgeon: Algernon Huxley, MD;  Location: [facility];  Service: Cardiovascular;  Laterality: N/A;  . PERIPHERAL VASCULAR CATHETERIZATION Left 12/14/2015   Procedure: A/V Shuntogram/Fistulagram;  Surgeon: Algernon Huxley, MD;  Location: [facility];  Service: Cardiovascular;  Laterality: Left;  . PERIPHERAL VASCULAR CATHETERIZATION N/A 12/14/2015   Procedure: A/V Shunt Intervention;  Surgeon: Algernon Huxley, MD;  Location: [facility];  Service: Cardiovascular;  Laterality: N/A;  . PERIPHERAL VASCULAR CATHETERIZATION N/A 12/17/2015   Procedure: Dialysis/Perma Catheter Insertion;  Surgeon: Algernon Huxley, MD;  Location: [facility];  Service: Cardiovascular;  Laterality: N/A;  . PERIPHERAL VASCULAR CATHETERIZATION Left 12/22/2015   Procedure: Dialysis/Perma Catheter Insertion;  Surgeon: Katha Cabal, MD;  Location: [facility];  Service: Cardiovascular;  Laterality: Left;  . PERIPHERAL VASCULAR CATHETERIZATION Left  02/29/2016   Procedure: A/V Shuntogram/Fistulagram;  Surgeon: Algernon Huxley, MD;  Location: [facility];  Service: Cardiovascular;  Laterality: Left;  . PERIPHERAL VASCULAR CATHETERIZATION N/A 02/29/2016   Procedure: A/V Shunt Intervention;  Surgeon: Algernon Huxley, MD;  Location: [facility];  Service: Cardiovascular;  Laterality: N/A;  . right arm graft     for dyalisis  . RIGHT/LEFT HEART CATH AND CORONARY ANGIOGRAPHY N/A 08/02/2017   Procedure: RIGHT/LEFT HEART CATH AND CORONARY ANGIOGRAPHY;  Surgeon: Larey Dresser, MD;  Location: [facility];  Service: Cardiovascular;  Laterality: N/A;  . TEE WITHOUT CARDIOVERSION N/A 07/31/2017   Procedure: TRANSESOPHAGEAL ECHOCARDIOGRAM (TEE);  Surgeon: Josue Hector, MD;  Location[facility];  Service: Cardiovascular;  Laterality: N/A;  . TEE WITHOUT CARDIOVERSION N/A 09/01/2017   Procedure: TRANSESOPHAGEAL ECHOCARDIOGRAM (TEE);  Surgeon: [name],  MD;  Location: [place];  Service: Open Heart Surgery;  Laterality: N/A;  . THROMBECTOMY    . THROMBECTOMY W/ EMBOLECTOMY  03/01/2011   Procedure: THROMBECTOMY ARTERIOVENOUS GORE-TEX GRAFT;  Surgeon: Elam Dutch, MD;  Location: [place];  Service: Vascular;  Laterality: Right;  Attempted Thrombectomy of Old  Right Upper Arm Arteriovenous gortex Graft. Insertion of new Arteriovenous Graft using 32mm x 50cm Gortex Stretch graft.   . THROMBECTOMY W/ EMBOLECTOMY  07/11/2011   Procedure: THROMBECTOMY ARTERIOVENOUS GORE-TEX GRAFT;  Surgeon: Rosetta Posner, MD;  Location: [facility];  Service: Vascular;  Laterality: Right;  . UMBILICAL HERNIA REPAIR    . VENOGRAM N/A 08/23/2011   Procedure: VENOGRAM;  Surgeon: [name], MD;  Location: [facility];  Service: Cardiovascular;  Laterality: N/A;     Family History  Problem Relation Age of Onset  . Hypertension Mother   . Heart disease Mother   . Hypertension Father   . Heart disease Father      Social History   Socioeconomic History  . Marital status: Married    Spouse name: Not on file  . Number of children: Not on file  . Years of education: Not on file  . Highest education level: Not on file  Occupational History  . Occupation: disabled  Social Needs  . Financial resource strain: Not on file  . Food insecurity:    Worry: Not on file    Inability: Not on file  . Transportation needs:    Medical: Not on file    Non-medical: Not on file  Tobacco Use  . Smoking status: Former Smoker    Types: Cigarettes    Last attempt to quit: 04/18/1994    Years since quitting: 23.5  . Smokeless tobacco: Current User    Types: Snuff  . Tobacco comment: dips 1/2 snuff per day times 25 years  Substance and Sexual Activity  . Alcohol use: No  . Drug use: No  . Sexual activity: Not on file  Lifestyle  . Physical activity:    Days per week: Not on file    Minutes per session: Not on file  . Stress: Not on file  Relationships  . Social connections:      Talks on phone: Not on file    Gets together: Not on file    Attends religious service: Not on file    Active member of club or organization: Not on file    Attends meetings of clubs or organizations: Not on file    Relationship status: Not on file  . Intimate partner violence:  Fear of current or ex partner: Not on file    Emotionally abused: Not on file    Physically abused: Not on file    Forced sexual activity: Not on file  Other Topics Concern  . Not on file  Social History Narrative  . Not on file     BP 130/80   Pulse 77   Ht 6' (1.829 m)   Wt (!) 314 lb 9.6 oz (142.7 kg)   BMI 42.67 kg/m   Physical Exam:  Well appearing 52 yo man, NAD HEENT: Unremarkable Neck:  No JVD, no thyromegally Lymphatics:  No adenopathy Back:  No CVA tenderness Lungs:  Clear with no whees=zes HEART:  Regular rate rhythm, no murmurs, no rubs, no clicks Abd:  soft, positive bowel sounds, no organomegally, no rebound, no guarding Ext:  2 plus pulses, no edema, no cyanosis, no clubbing Skin:  No rashes no nodules Neuro:  CN II through XII intact, motor grossly intact  EKG - nsr with borderline qt prolongation   Assess/Plan: 1. Chronic systolic heart failure - his symptoms remain class 2. He is s/p AVR. He appears euvolemic.  2. AS - he is s/p AVR with a mechanical prosthesis which appears to be working normally. 3.ESRD - he is tolerating HD. I discussed the likely benefit of a S-ICD rather than a standard ICD. He wishes to proceed.  Mikle Bosworth.D.

## 2017-11-06 NOTE — H&P (View-Only) (Signed)
HPI [name] [name] is referred today by Dr. Jacinta Shoe to discuss insertion of an ICD for primary prevention of malignant ventricular arrhythmias. He is a pleasant 52 yo man with ESRD on HD. He developed aortic valve disease and underwent AVR a couple of months ago. He did very well postop. He denies chest pain or sob. He has not had syncope. No edema. He is not having symptoms during HD.  No Known Allergies   Current Outpatient Medications  Medication Sig Dispense Refill  . amiodarone (PACERONE) 200 MG tablet Take 200 mg by mouth daily.    [name] aspirin EC 81 MG EC tablet Take 1 tablet (81 mg total) by mouth daily.    [name] atorvastatin (LIPITOR) 80 MG tablet Take 1 tablet (80 mg total) by mouth daily at 6 PM. 90 tablet 3  . B Complex-C-Folic Acid (NEPHRO-VITE PO) Take 1 tablet by mouth daily.     . chlorhexidine (PERIDEX) 0.12 % solution Rinse with 15 mls twice daily for 30 seconds. Use after breakfast and at bedtime. Spit out excess. Do not swallow. (Patient taking differently: Use as directed 15 mLs in the mouth or throat 4 (four) times daily. For 30 seconds. Use after breakfast and at bedtime. Spit out excess. Do not swallow.) 960 mL prn  . FOSRENOL 500 MG chewable tablet Chew 1,500-2,000 mg by mouth See admin instructions. Take 2000 mg by mouth with meals and take 1500 mg by mouth with snacks    . metoprolol tartrate (LOPRESSOR) 25 MG tablet Take 1 tablet (25 mg total) by mouth 2 (two) times daily. 60 tablet 3  . midodrine (PROAMATINE) 5 MG tablet Take 3 tablets (15 mg total) by mouth 3 (three) times daily with meals. 90 tablet 2  . oxyCODONE (OXY IR/ROXICODONE) 5 MG immediate release tablet Take 1 tablet (5 mg total) by mouth every 4 (four) hours as needed for severe pain. 30 tablet 0  . warfarin (COUMADIN) 5 MG tablet Take 1 tablet daily except 1/2 tablet on Tuesdays, Thursdays and Saturdays or as directed by coumadin clinic (Patient taking differently: Take 5 mg by mouth daily. ) 90 tablet 1     No current facility-administered medications for this visit.      Past Medical History:  Diagnosis Date  . Aneurysm ([place])     Right arm fistula 3 aneurysms 2011,   plans to have a new procedure  . Angina   . Chest discomfort   . CHF (congestive heart failure) ([place])   . Coronary artery disease   . Coronary artery disease involving native coronary artery of native heart without angina pectoris   . Dialysis patient ([place])   . Dysrhythmia   . ESRD (end stage renal disease) ([place])    on hemodialysis T_T_S  . Hypertension   . Leg pain   . Mitral regurgitation   . Morbid obesity ([place])   . Orthostatic hypotension   . Overweight(278.02)   . Renal insufficiency   . 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 Severe aortic insufficiency   . Sinus tachycardia   . Syncope     positional after dialysis... 2008    ROS:   All systems reviewed and negative except as noted in the HPI.   Past Surgical History:  Procedure Laterality Date  . A/V FISTULAGRAM Left 06/30/2016  Procedure: A/V Fistulagram;  Surgeon: Algernon Huxley, MD;  Location: [facility];  Service: Cardiovascular;  Laterality: Left;  . AORTIC VALVE REPLACEMENT N/A 09/01/2017   Procedure: AORTIC VALVE REPLACEMENT (AVR);  Surgeon: [name], MD;  Location: [facility];  Service: Open Heart Surgery;  Laterality: N/A;  . ARTERIOVENOUS GRAFT PLACEMENT    . AV FISTULA PLACEMENT    . AV FISTULA PLACEMENT Left 11/18/2015   Procedure: ARTERIOVENOUS (AV) FISTULA CREATION ( RADIOCEPHALIC );  Surgeon: Algernon Huxley, MD;  Location: [facility];  Service: Vascular;  Laterality: Left;  . AV FISTULA PLACEMENT Left 03/23/2016   Procedure: ARTERIOVENOUS (AV) FISTULA CREATION ( REVISION );  Surgeon: Algernon Huxley, MD;  Location: [facility];  Service: Vascular;  Laterality: Left;  . Walker REMOVAL Left 03/23/2016    Procedure: REMOVAL OF ARTERIOVENOUS GORETEX GRAFT ([place]);  Surgeon: Algernon Huxley, MD;  Location: [facility];  Service: Vascular;  Laterality: Left;  . CENTRAL LINE INSERTION Right 09/01/2017   Procedure: FLOROSCOPY GUIDED PLACEMENT OF RIGHT FEMORAL CENTRAL LINE TIMES TWO  AND PLACEMENT OF SWAN GANZ CATHETER;  Surgeon: [name], MD;  Location: [place];  Service: Open Heart Surgery;  Laterality: Right;  . CORONARY ARTERY BYPASS GRAFT N/A 09/01/2017   Procedure: CORONARY ARTERY BYPASS GRAFTING (CABG) X 1, USING RIGHT GREATER SAPHENOUS VEIN HARVESTED ENDOSCOPICALLY;  Surgeon: [name], MD;  Location: [place];  Service: Open Heart Surgery;  Laterality: N/A;  . DIALYSIS/PERMA CATHETER REMOVAL N/A 08/01/2016   Procedure: Dialysis/Perma Catheter Removal;  Surgeon: Algernon Huxley, MD;  Location: [facility];  Service: Cardiovascular;  Laterality: N/A;  . HERNIA REPAIR    . INSERTION OF DIALYSIS CATHETER  03/01/2011   Procedure: INSERTION OF DIALYSIS CATHETER;  Surgeon: Elam Dutch, MD;  Location: [facility];  Service: Vascular;  Laterality: Left;  Exchange of Dialysis Catheter to 27cm 15Fr. Arrow Catheter  . INSERTION OF DIALYSIS CATHETER  09/01/2017   Procedure: PLACEMENT OF TRIALYSIS SHORT TERM DIALYSIS CATHETER;  Surgeon: [name], MD;  Location: [facility];  Service: Open Heart Surgery;;  . MULTIPLE EXTRACTIONS WITH ALVEOLOPLASTY N/A 08/07/2017   Procedure: Extraction of tooth #2, 7,8,13,14,15,17, and 31 with alveoloplasty and gross debridement of remaining teeth;  Surgeon: [name], DDS;  Location: [facility];  Service: Oral Surgery;  Laterality: N/A;  . PERIPHERAL VASCULAR CATHETERIZATION N/A 09/01/2014   Procedure: A/V Shuntogram/Fistulagram;  Surgeon: Algernon Huxley, MD;  Location: [facility];  Service: Cardiovascular;  Laterality: N/A;  . PERIPHERAL VASCULAR CATHETERIZATION Right 09/01/2014   Procedure: Thrombectomy;  Surgeon: Algernon Huxley, MD;  Location: [facility];   Service: Cardiovascular;  Laterality: Right;  . PERIPHERAL VASCULAR CATHETERIZATION Right 09/01/2014   Procedure: A/V Shunt Intervention;  Surgeon: Algernon Huxley, MD;  Location: [facility];  Service: Cardiovascular;  Laterality: Right;  . PERIPHERAL VASCULAR CATHETERIZATION N/A 09/17/2014   Procedure: A/V Shuntogram/Fistulagram;  Surgeon: Algernon Huxley, MD;  Location: [facility];  Service: Cardiovascular;  Laterality: N/A;  . PERIPHERAL VASCULAR CATHETERIZATION Right 10/17/2014   Procedure: Thrombectomy;  Surgeon: Katha Cabal, MD;  Location: [facility];  Service: Cardiovascular;  Laterality: Right;  . PERIPHERAL VASCULAR CATHETERIZATION N/A 10/17/2014   Procedure: A/V Shuntogram/Fistulagram;  Surgeon: Katha Cabal, MD;  Location: [facility];  Service: Cardiovascular;  Laterality: N/A;  . PERIPHERAL VASCULAR CATHETERIZATION N/A 10/17/2014   Procedure: A/V Shunt Intervention;  Surgeon: Katha Cabal, MD;  Location: [facility];  Service: Cardiovascular;  Laterality: N/A;  . PERIPHERAL VASCULAR CATHETERIZATION Right 11/04/2014   Procedure: Thrombectomy;  Surgeon: Katha Cabal, MD;  Location: [facility];  Service: Cardiovascular;  Laterality: Right;  . PERIPHERAL VASCULAR CATHETERIZATION Left 11/04/2014   Procedure: Visceral Venography;  Surgeon: Katha Cabal, MD;  Location: [facility];  Service: Cardiovascular;  Laterality: Left;  . PERIPHERAL VASCULAR CATHETERIZATION Right 11/27/2014   Procedure: A/V Shuntogram/Fistulagram;  Surgeon: Algernon Huxley, MD;  Location: [facility];  Service: Cardiovascular;  Laterality: Right;  . PERIPHERAL VASCULAR CATHETERIZATION Right 11/27/2014   Procedure: A/V Shunt Intervention;  Surgeon: Algernon Huxley, MD;  Location: [facility];  Service: Cardiovascular;  Laterality: Right;  . PERIPHERAL VASCULAR CATHETERIZATION Right 12/31/2014   Procedure: Thrombectomy;  Surgeon: Algernon Huxley, MD;  Location: [facility];  Service: Cardiovascular;  Laterality: Right;  . PERIPHERAL VASCULAR CATHETERIZATION Right 01/12/2015   Procedure: A/V Shuntogram/Fistulagram;  Surgeon: Algernon Huxley, MD;  Location: [facility];  Service: Cardiovascular;  Laterality: Right;  . PERIPHERAL VASCULAR CATHETERIZATION N/A 01/12/2015   Procedure: A/V Shunt Intervention;  Surgeon: Algernon Huxley, MD;  Location: [facility];  Service: Cardiovascular;  Laterality: N/A;  . PERIPHERAL VASCULAR CATHETERIZATION Right 01/28/2015   Procedure: Thrombectomy;  Surgeon: Algernon Huxley, MD;  Location: [place] [facility];  Service: Cardiovascular;  Laterality: Right;  . PERIPHERAL VASCULAR CATHETERIZATION N/A 06/08/2015   Procedure: A/V Shuntogram/Fistulagram;  Surgeon: Algernon Huxley, MD;  Location: [facility];  Service: Cardiovascular;  Laterality: N/A;  . PERIPHERAL VASCULAR CATHETERIZATION N/A 06/08/2015   Procedure: A/V Shunt Intervention;  Surgeon: Algernon Huxley, MD;  Location: [facility];  Service: Cardiovascular;  Laterality: N/A;  . PERIPHERAL VASCULAR CATHETERIZATION Right 07/06/2015   Procedure: A/V Shuntogram/Fistulagram;  Surgeon: Algernon Huxley, MD;  Location: [facility];  Service: Cardiovascular;  Laterality: Right;  . PERIPHERAL VASCULAR CATHETERIZATION N/A 07/06/2015   Procedure: A/V Shunt Intervention;  Surgeon: Algernon Huxley, MD;  Location: [facility];  Service: Cardiovascular;  Laterality: N/A;  . PERIPHERAL VASCULAR CATHETERIZATION N/A 08/04/2015   Procedure: graft declot;  Surgeon: Katha Cabal, MD;  Location: [facility];  Service: Cardiovascular;  Laterality: N/A;  . PERIPHERAL VASCULAR CATHETERIZATION Right 08/25/2015   Procedure: A/V Shuntogram/Fistulagram;  Surgeon: Katha Cabal, MD;  Location: [facility] [facility];  Service: Cardiovascular;  Laterality: Right;  . PERIPHERAL VASCULAR CATHETERIZATION N/A 11/04/2015   Procedure:  Dialysis/Perma Catheter Insertion;  Surgeon: Algernon Huxley, MD;  Location: [facility];  Service: Cardiovascular;  Laterality: N/A;  . PERIPHERAL VASCULAR CATHETERIZATION Left 12/14/2015   Procedure: A/V Shuntogram/Fistulagram;  Surgeon: Algernon Huxley, MD;  Location: [facility];  Service: Cardiovascular;  Laterality: Left;  . PERIPHERAL VASCULAR CATHETERIZATION N/A 12/14/2015   Procedure: A/V Shunt Intervention;  Surgeon: Algernon Huxley, MD;  Location: [facility];  Service: Cardiovascular;  Laterality: N/A;  . PERIPHERAL VASCULAR CATHETERIZATION N/A 12/17/2015   Procedure: Dialysis/Perma Catheter Insertion;  Surgeon: Algernon Huxley, MD;  Location: [facility];  Service: Cardiovascular;  Laterality: N/A;  . PERIPHERAL VASCULAR CATHETERIZATION Left 12/22/2015   Procedure: Dialysis/Perma Catheter Insertion;  Surgeon: Katha Cabal, MD;  Location: [facility];  Service: Cardiovascular;  Laterality: Left;  . PERIPHERAL VASCULAR CATHETERIZATION Left  02/29/2016   Procedure: A/V Shuntogram/Fistulagram;  Surgeon: Algernon Huxley, MD;  Location: [facility];  Service: Cardiovascular;  Laterality: Left;  . PERIPHERAL VASCULAR CATHETERIZATION N/A 02/29/2016   Procedure: A/V Shunt Intervention;  Surgeon: Algernon Huxley, MD;  Location: [facility];  Service: Cardiovascular;  Laterality: N/A;  . right arm graft     for dyalisis  . RIGHT/LEFT HEART CATH AND CORONARY ANGIOGRAPHY N/A 08/02/2017   Procedure: RIGHT/LEFT HEART CATH AND CORONARY ANGIOGRAPHY;  Surgeon: Larey Dresser, MD;  Location: [facility];  Service: Cardiovascular;  Laterality: N/A;  . TEE WITHOUT CARDIOVERSION N/A 07/31/2017   Procedure: TRANSESOPHAGEAL ECHOCARDIOGRAM (TEE);  Surgeon: Josue Hector, MD;  Location[facility];  Service: Cardiovascular;  Laterality: N/A;  . TEE WITHOUT CARDIOVERSION N/A 09/01/2017   Procedure: TRANSESOPHAGEAL ECHOCARDIOGRAM (TEE);  Surgeon: [name],  MD;  Location: [place];  Service: Open Heart Surgery;  Laterality: N/A;  . THROMBECTOMY    . THROMBECTOMY W/ EMBOLECTOMY  03/01/2011   Procedure: THROMBECTOMY ARTERIOVENOUS GORE-TEX GRAFT;  Surgeon: Elam Dutch, MD;  Location: [place];  Service: Vascular;  Laterality: Right;  Attempted Thrombectomy of Old  Right Upper Arm Arteriovenous gortex Graft. Insertion of new Arteriovenous Graft using 46mm x 50cm Gortex Stretch graft.   . THROMBECTOMY W/ EMBOLECTOMY  07/11/2011   Procedure: THROMBECTOMY ARTERIOVENOUS GORE-TEX GRAFT;  Surgeon: Rosetta Posner, MD;  Location: [place];  Service: Vascular;  Laterality: Right;  . UMBILICAL HERNIA REPAIR    . VENOGRAM N/A 08/23/2011   Procedure: VENOGRAM;  Surgeon: [name], MD;  Location: [facility];  Service: Cardiovascular;  Laterality: N/A;     Family History  Problem Relation Age of Onset  . Hypertension Mother   . Heart disease Mother   . Hypertension Father   . Heart disease Father      Social History   Socioeconomic History  . Marital status: Married    Spouse name: Not on file  . Number of children: Not on file  . Years of education: Not on file  . Highest education level: Not on file  Occupational History  . Occupation: disabled  Social Needs  . Financial resource strain: Not on file  . Food insecurity:    Worry: Not on file    Inability: Not on file  . Transportation needs:    Medical: Not on file    Non-medical: Not on file  Tobacco Use  . Smoking status: Former Smoker    Types: Cigarettes    Last attempt to quit: 04/18/1994    Years since quitting: 23.5  . Smokeless tobacco: Current User    Types: Snuff  . Tobacco comment: dips 1/2 snuff per day times 25 years  Substance and Sexual Activity  . Alcohol use: No  . Drug use: No  . Sexual activity: Not on file  Lifestyle  . Physical activity:    Days per week: Not on file    Minutes per session: Not on file  . Stress: Not on file  Relationships  . Social connections:      Talks on phone: Not on file    Gets together: Not on file    Attends religious service: Not on file    Active member of club or organization: Not on file    Attends meetings of clubs or organizations: Not on file    Relationship status: Not on file  . Intimate partner violence:  Fear of current or ex partner: Not on file    Emotionally abused: Not on file    Physically abused: Not on file    Forced sexual activity: Not on file  Other Topics Concern  . Not on file  Social History Narrative  . Not on file     BP 130/80   Pulse 77   Ht 6' (1.829 m)   Wt (!) 314 lb 9.6 oz (142.7 kg)   BMI 42.67 kg/m   Physical Exam:  Well appearing 52 yo man, NAD HEENT: Unremarkable Neck:  No JVD, no thyromegally Lymphatics:  No adenopathy Back:  No CVA tenderness Lungs:  Clear with no whees=zes HEART:  Regular rate rhythm, no murmurs, no rubs, no clicks Abd:  soft, positive bowel sounds, no organomegally, no rebound, no guarding Ext:  2 plus pulses, no edema, no cyanosis, no clubbing Skin:  No rashes no nodules Neuro:  CN II through XII intact, motor grossly intact  EKG - nsr with borderline qt prolongation   Assess/Plan: 1. Chronic systolic heart failure - his symptoms remain class 2. He is s/p AVR. He appears euvolemic.  2. AS - he is s/p AVR with a mechanical prosthesis which appears to be working normally. 3.ESRD - he is tolerating HD. I discussed the likely benefit of a S-ICD rather than a standard ICD. He wishes to proceed.  Mikle Bosworth.D.

## 2017-11-06 NOTE — Patient Instructions (Addendum)
Medication Instructions:  The current medical regimen is effective;  continue present plan and medications.  Labwork: Please return for lab November 21, 2017. (CBC, BMP and PT/INR).  Testing/Procedures: Your physician has recommended that you have a defibrillator inserted. An implantable cardioverter defibrillator (ICD) is a small device that is placed in your chest or, in rare cases, your abdomen. This device uses electrical pulses or shocks to help control life-threatening, irregular heartbeats that could lead the heart to suddenly stop beating (sudden cardiac arrest). Leads are attached to the ICD that goes into your heart. This is done in the hospital and usually requires an overnight stay. Please see the instruction sheet given to you today for more information.  Follow-Up: Follow up in 10 to 14 days after your procedure in the device clinic for a wound check.. Follow up in 3 months with Dr Lovena Le  If you need a refill on your cardiac medications before your next appointment, please call your pharmacy.  Thank you for choosing [facility]!!    Please arrive at the [facility] on November 28, 2017 at 5:30 am. Do not eat or drink after midnight the night prior to the procedure. Do not take any medications the morning of the procedure. Hold Warfarin 2 days prior to the procedure. Plan for one night stay. Use surgical scrub according to directions. Will need someone to drive you home at discharge.

## 2017-11-07 DIAGNOSIS — Z992 Dependence on renal dialysis: Secondary | ICD-10-CM | POA: Diagnosis not present

## 2017-11-07 DIAGNOSIS — E611 Iron deficiency: Secondary | ICD-10-CM | POA: Diagnosis not present

## 2017-11-07 DIAGNOSIS — N186 End stage renal disease: Secondary | ICD-10-CM | POA: Diagnosis not present

## 2017-11-07 DIAGNOSIS — N2581 Secondary hyperparathyroidism of renal origin: Secondary | ICD-10-CM | POA: Diagnosis not present

## 2017-11-09 DIAGNOSIS — N186 End stage renal disease: Secondary | ICD-10-CM | POA: Diagnosis not present

## 2017-11-09 DIAGNOSIS — N2581 Secondary hyperparathyroidism of renal origin: Secondary | ICD-10-CM | POA: Diagnosis not present

## 2017-11-09 DIAGNOSIS — E611 Iron deficiency: Secondary | ICD-10-CM | POA: Diagnosis not present

## 2017-11-09 DIAGNOSIS — Z992 Dependence on renal dialysis: Secondary | ICD-10-CM | POA: Diagnosis not present

## 2017-11-11 DIAGNOSIS — Z992 Dependence on renal dialysis: Secondary | ICD-10-CM | POA: Diagnosis not present

## 2017-11-11 DIAGNOSIS — N186 End stage renal disease: Secondary | ICD-10-CM | POA: Diagnosis not present

## 2017-11-11 DIAGNOSIS — N2581 Secondary hyperparathyroidism of renal origin: Secondary | ICD-10-CM | POA: Diagnosis not present

## 2017-11-11 DIAGNOSIS — E611 Iron deficiency: Secondary | ICD-10-CM | POA: Diagnosis not present

## 2017-11-14 DIAGNOSIS — E611 Iron deficiency: Secondary | ICD-10-CM | POA: Diagnosis not present

## 2017-11-14 DIAGNOSIS — N186 End stage renal disease: Secondary | ICD-10-CM | POA: Diagnosis not present

## 2017-11-14 DIAGNOSIS — Z992 Dependence on renal dialysis: Secondary | ICD-10-CM | POA: Diagnosis not present

## 2017-11-14 DIAGNOSIS — N2581 Secondary hyperparathyroidism of renal origin: Secondary | ICD-10-CM | POA: Diagnosis not present

## 2017-11-15 DIAGNOSIS — N186 End stage renal disease: Secondary | ICD-10-CM | POA: Diagnosis not present

## 2017-11-15 DIAGNOSIS — Z992 Dependence on renal dialysis: Secondary | ICD-10-CM | POA: Diagnosis not present

## 2017-11-16 ENCOUNTER — Ambulatory Visit (INDEPENDENT_AMBULATORY_CARE_PROVIDER_SITE_OTHER): Payer: Medicare Other | Admitting: *Deleted

## 2017-11-16 DIAGNOSIS — Z954 Presence of other heart-valve replacement: Secondary | ICD-10-CM | POA: Diagnosis not present

## 2017-11-16 DIAGNOSIS — Z992 Dependence on renal dialysis: Secondary | ICD-10-CM | POA: Diagnosis not present

## 2017-11-16 DIAGNOSIS — N186 End stage renal disease: Secondary | ICD-10-CM | POA: Diagnosis not present

## 2017-11-16 DIAGNOSIS — N2581 Secondary hyperparathyroidism of renal origin: Secondary | ICD-10-CM | POA: Diagnosis not present

## 2017-11-16 DIAGNOSIS — E611 Iron deficiency: Secondary | ICD-10-CM | POA: Diagnosis not present

## 2017-11-16 DIAGNOSIS — T82868D Thrombosis of vascular prosthetic devices, implants and grafts, subsequent encounter: Secondary | ICD-10-CM

## 2017-11-16 DIAGNOSIS — Z5181 Encounter for therapeutic drug level monitoring: Secondary | ICD-10-CM

## 2017-11-16 LAB — POCT INR: INR: 2.2 (ref 2.0–3.0)

## 2017-11-16 NOTE — Patient Instructions (Addendum)
Continue coumadin 1 tablet daily  Amiodarone decreased to 200 mg daily on 6/7 Recheck in 1 week   Pending AICD on 8/13 by Dr Lovena Le  Told to hold coumadin 2 days before procedure.

## 2017-11-18 DIAGNOSIS — N186 End stage renal disease: Secondary | ICD-10-CM | POA: Diagnosis not present

## 2017-11-18 DIAGNOSIS — Z992 Dependence on renal dialysis: Secondary | ICD-10-CM | POA: Diagnosis not present

## 2017-11-18 DIAGNOSIS — E611 Iron deficiency: Secondary | ICD-10-CM | POA: Diagnosis not present

## 2017-11-18 DIAGNOSIS — N2581 Secondary hyperparathyroidism of renal origin: Secondary | ICD-10-CM | POA: Diagnosis not present

## 2017-11-21 ENCOUNTER — Other Ambulatory Visit: Payer: Medicare Other

## 2017-11-21 DIAGNOSIS — I5022 Chronic systolic (congestive) heart failure: Secondary | ICD-10-CM

## 2017-11-21 DIAGNOSIS — E611 Iron deficiency: Secondary | ICD-10-CM | POA: Diagnosis not present

## 2017-11-21 DIAGNOSIS — N186 End stage renal disease: Secondary | ICD-10-CM | POA: Diagnosis not present

## 2017-11-21 DIAGNOSIS — Z992 Dependence on renal dialysis: Secondary | ICD-10-CM | POA: Diagnosis not present

## 2017-11-21 DIAGNOSIS — Z01812 Encounter for preprocedural laboratory examination: Secondary | ICD-10-CM | POA: Diagnosis not present

## 2017-11-21 DIAGNOSIS — N2581 Secondary hyperparathyroidism of renal origin: Secondary | ICD-10-CM | POA: Diagnosis not present

## 2017-11-22 LAB — BASIC METABOLIC PANEL
BUN/Creatinine Ratio: 4 — ABNORMAL LOW (ref 9–20)
BUN: 22 mg/dL (ref 6–24)
CO2: 27 mmol/L (ref 20–29)
CREATININE: 5.98 mg/dL — AB (ref 0.76–1.27)
Calcium: 9.7 mg/dL (ref 8.7–10.2)
Chloride: 96 mmol/L (ref 96–106)
GFR calc Af Amer: 11 mL/min/{1.73_m2} — ABNORMAL LOW (ref >59)
GFR, EST NON AFRICAN AMERICAN: 10 mL/min/{1.73_m2} — AB (ref >59)
GLUCOSE: 82 mg/dL (ref 65–99)
Potassium: 4.1 mmol/L (ref 3.5–5.2)
Sodium: 141 mmol/L (ref 134–144)

## 2017-11-22 LAB — CBC
HEMATOCRIT: 36.3 % — AB (ref 37.5–51.0)
HEMOGLOBIN: 11.6 g/dL — AB (ref 13.0–17.7)
MCH: 24.4 pg — ABNORMAL LOW (ref 26.6–33.0)
MCHC: 32 g/dL (ref 31.5–35.7)
MCV: 76 fL — ABNORMAL LOW (ref 79–97)
Platelets: 303 10*3/uL (ref 150–450)
RBC: 4.75 x10E6/uL (ref 4.14–5.80)
RDW: 17 % — ABNORMAL HIGH (ref 12.3–15.4)
WBC: 6.7 10*3/uL (ref 3.4–10.8)

## 2017-11-22 LAB — PROTIME-INR
INR: 1.4 — AB (ref 0.8–1.2)
PROTHROMBIN TIME: 14.7 s — AB (ref 9.1–12.0)

## 2017-11-23 ENCOUNTER — Ambulatory Visit: Payer: Self-pay | Admitting: *Deleted

## 2017-11-23 DIAGNOSIS — E611 Iron deficiency: Secondary | ICD-10-CM | POA: Diagnosis not present

## 2017-11-23 DIAGNOSIS — Z992 Dependence on renal dialysis: Secondary | ICD-10-CM | POA: Diagnosis not present

## 2017-11-23 DIAGNOSIS — N2581 Secondary hyperparathyroidism of renal origin: Secondary | ICD-10-CM | POA: Diagnosis not present

## 2017-11-23 DIAGNOSIS — N186 End stage renal disease: Secondary | ICD-10-CM | POA: Diagnosis not present

## 2017-11-23 NOTE — Patient Instructions (Signed)
Pt was a no show for INR appt today.  Called pt: states dialysis ran late.  INR was 1.4 on pre-procedure labs on 11/21/17 Told pt to take coumadin 1 1/2 tablets tonight, Friday and Saturday.  He will hold coumadin on Sunday and Monday for procedure on Tuesday 8/13.  He will restart coumadin night of 8/13 if ok with Dr Lovena Le increasing dose to 5mg  daily except 7.5mg  on Tuesdays and Fridays.  Recheck INR 8/22.  Pt and wife told and they verbalized understanding.  Amiodarone decreased to 200 mg daily on 6/7 Recheck in 1 week   Pending AICD on 8/13 by Dr Lovena Le  Told to hold coumadin 2 days before procedure.

## 2017-11-25 DIAGNOSIS — Z992 Dependence on renal dialysis: Secondary | ICD-10-CM | POA: Diagnosis not present

## 2017-11-25 DIAGNOSIS — N2581 Secondary hyperparathyroidism of renal origin: Secondary | ICD-10-CM | POA: Diagnosis not present

## 2017-11-25 DIAGNOSIS — E611 Iron deficiency: Secondary | ICD-10-CM | POA: Diagnosis not present

## 2017-11-25 DIAGNOSIS — N186 End stage renal disease: Secondary | ICD-10-CM | POA: Diagnosis not present

## 2017-11-27 DIAGNOSIS — Z992 Dependence on renal dialysis: Secondary | ICD-10-CM | POA: Diagnosis not present

## 2017-11-27 DIAGNOSIS — N2581 Secondary hyperparathyroidism of renal origin: Secondary | ICD-10-CM | POA: Diagnosis not present

## 2017-11-27 DIAGNOSIS — N186 End stage renal disease: Secondary | ICD-10-CM | POA: Diagnosis not present

## 2017-11-27 DIAGNOSIS — E611 Iron deficiency: Secondary | ICD-10-CM | POA: Diagnosis not present

## 2017-11-28 ENCOUNTER — Other Ambulatory Visit: Payer: Self-pay

## 2017-11-28 ENCOUNTER — Ambulatory Visit (HOSPITAL_COMMUNITY): Payer: Medicare Other | Admitting: Anesthesiology

## 2017-11-28 ENCOUNTER — Encounter (HOSPITAL_COMMUNITY)
Admission: RE | Disposition: A | Discharge status: Home / Self Care | Payer: Self-pay | Source: Ambulatory Visit | Attending: Internal Medicine

## 2017-11-28 ENCOUNTER — Ambulatory Visit (HOSPITAL_COMMUNITY)
Admission: RE | Admit: 2017-11-28 | Discharge: 2017-11-28 | Disposition: A | Discharge status: Home / Self Care | Payer: Medicare Other | Source: Ambulatory Visit | Attending: Internal Medicine | Admitting: Internal Medicine

## 2017-11-28 DIAGNOSIS — I255 Ischemic cardiomyopathy: Secondary | ICD-10-CM | POA: Diagnosis not present

## 2017-11-28 DIAGNOSIS — I509 Heart failure, unspecified: Secondary | ICD-10-CM | POA: Diagnosis not present

## 2017-11-28 DIAGNOSIS — Z7982 Long term (current) use of aspirin: Secondary | ICD-10-CM | POA: Diagnosis not present

## 2017-11-28 DIAGNOSIS — I132 Hypertensive heart and chronic kidney disease with heart failure and with stage 5 chronic kidney disease, or end stage renal disease: Secondary | ICD-10-CM | POA: Diagnosis not present

## 2017-11-28 DIAGNOSIS — Z4502 Encounter for adjustment and management of automatic implantable cardiac defibrillator: Secondary | ICD-10-CM | POA: Diagnosis not present

## 2017-11-28 DIAGNOSIS — Z7901 Long term (current) use of anticoagulants: Secondary | ICD-10-CM | POA: Diagnosis not present

## 2017-11-28 DIAGNOSIS — Z952 Presence of prosthetic heart valve: Secondary | ICD-10-CM | POA: Diagnosis not present

## 2017-11-28 DIAGNOSIS — N185 Chronic kidney disease, stage 5: Secondary | ICD-10-CM | POA: Diagnosis not present

## 2017-11-28 DIAGNOSIS — Z992 Dependence on renal dialysis: Secondary | ICD-10-CM | POA: Insufficient documentation

## 2017-11-28 DIAGNOSIS — I252 Old myocardial infarction: Secondary | ICD-10-CM | POA: Insufficient documentation

## 2017-11-28 DIAGNOSIS — I340 Nonrheumatic mitral (valve) insufficiency: Secondary | ICD-10-CM | POA: Diagnosis not present

## 2017-11-28 DIAGNOSIS — Z951 Presence of aortocoronary bypass graft: Secondary | ICD-10-CM | POA: Diagnosis not present

## 2017-11-28 DIAGNOSIS — Z87891 Personal history of nicotine dependence: Secondary | ICD-10-CM | POA: Diagnosis not present

## 2017-11-28 DIAGNOSIS — Z8249 Family history of ischemic heart disease and other diseases of the circulatory system: Secondary | ICD-10-CM | POA: Insufficient documentation

## 2017-11-28 DIAGNOSIS — Z6841 Body Mass Index (BMI) 40.0 and over, adult: Secondary | ICD-10-CM | POA: Diagnosis not present

## 2017-11-28 DIAGNOSIS — I5022 Chronic systolic (congestive) heart failure: Secondary | ICD-10-CM | POA: Insufficient documentation

## 2017-11-28 DIAGNOSIS — I5043 Acute on chronic combined systolic (congestive) and diastolic (congestive) heart failure: Secondary | ICD-10-CM | POA: Diagnosis present

## 2017-11-28 DIAGNOSIS — I2510 Atherosclerotic heart disease of native coronary artery without angina pectoris: Secondary | ICD-10-CM | POA: Insufficient documentation

## 2017-11-28 DIAGNOSIS — N186 End stage renal disease: Secondary | ICD-10-CM | POA: Insufficient documentation

## 2017-11-28 HISTORY — PX: SUBQ ICD IMPLANT: EP1223

## 2017-11-28 LAB — POCT I-STAT, CHEM 8
BUN: 22 mg/dL — ABNORMAL HIGH (ref 6–20)
CALCIUM ION: 1.2 mmol/L (ref 1.15–1.40)
CHLORIDE: 97 mmol/L — AB (ref 98–111)
Creatinine, Ser: 6.6 mg/dL — ABNORMAL HIGH (ref 0.61–1.24)
Glucose, Bld: 80 mg/dL (ref 70–99)
HCT: 38 % — ABNORMAL LOW (ref 39.0–52.0)
Hemoglobin: 12.9 g/dL — ABNORMAL LOW (ref 13.0–17.0)
Potassium: 4 mmol/L (ref 3.5–5.1)
SODIUM: 139 mmol/L (ref 135–145)
TCO2: 29 mmol/L (ref 22–32)

## 2017-11-28 LAB — PROTIME-INR
INR: 1.8
PROTHROMBIN TIME: 20.7 s — AB (ref 11.4–15.2)

## 2017-11-28 LAB — SURGICAL PCR SCREEN
MRSA, PCR: NEGATIVE
Staphylococcus aureus: NEGATIVE

## 2017-11-28 SURGERY — SUBQ ICD IMPLANT
Anesthesia: Monitor Anesthesia Care

## 2017-11-28 MED ORDER — HEPARIN (PORCINE) IN NACL 1000-0.9 UT/500ML-% IV SOLN
INTRAVENOUS | Status: AC
  Filled 2017-11-28: qty 500

## 2017-11-28 MED ORDER — FENTANYL CITRATE (PF) 100 MCG/2ML IJ SOLN: 25.0000 ug | INTRAMUSCULAR | Status: DC | PRN

## 2017-11-28 MED ORDER — LIDOCAINE 2% (20 MG/ML) 5 ML SYRINGE
INTRAMUSCULAR | Status: DC | PRN
  Administered 2017-11-28: 40 mg via INTRAVENOUS

## 2017-11-28 MED ORDER — SODIUM CHLORIDE 0.9 % IV SOLN
80.0000 mg | INTRAVENOUS | Status: AC
  Administered 2017-11-28: 80 mg

## 2017-11-28 MED ORDER — BUPIVACAINE HCL (PF) 0.25 % IJ SOLN
INTRAMUSCULAR | Status: AC
  Filled 2017-11-28: qty 120

## 2017-11-28 MED ORDER — ACETAMINOPHEN 325 MG PO TABS: 325.0000 mg | ORAL_TABLET | ORAL | Status: DC | PRN

## 2017-11-28 MED ORDER — PROPOFOL 10 MG/ML IV BOLUS
INTRAVENOUS | Status: DC | PRN
  Administered 2017-11-28: 20 mg via INTRAVENOUS
  Administered 2017-11-28: 40 mg via INTRAVENOUS
  Administered 2017-11-28: 20 mg via INTRAVENOUS

## 2017-11-28 MED ORDER — SODIUM CHLORIDE 0.9 % IV SOLN
INTRAVENOUS | Status: DC | PRN
  Administered 2017-11-28: 25 ug/min via INTRAVENOUS

## 2017-11-28 MED ORDER — FENTANYL CITRATE (PF) 100 MCG/2ML IJ SOLN
INTRAMUSCULAR | Status: DC | PRN
  Administered 2017-11-28: 100 ug via INTRAVENOUS
  Administered 2017-11-28 (×2): 25 ug via INTRAVENOUS

## 2017-11-28 MED ORDER — CHLORHEXIDINE GLUCONATE 4 % EX LIQD
60.0000 mL | Freq: Once | CUTANEOUS | Status: DC
  Filled 2017-11-28: qty 60

## 2017-11-28 MED ORDER — SODIUM CHLORIDE 0.9 % IV SOLN
INTRAVENOUS | Status: AC
  Filled 2017-11-28 (×2): qty 2

## 2017-11-28 MED ORDER — MUPIROCIN 2 % EX OINT
TOPICAL_OINTMENT | CUTANEOUS | Status: AC
  Administered 2017-11-28: 06:00:00
  Filled 2017-11-28: qty 22

## 2017-11-28 MED ORDER — SODIUM CHLORIDE 0.9 % IV SOLN
INTRAVENOUS | Status: DC
  Administered 2017-11-28: 06:00:00 via INTRAVENOUS

## 2017-11-28 MED ORDER — HEPARIN (PORCINE) IN NACL 1000-0.9 UT/500ML-% IV SOLN
INTRAVENOUS | Status: DC | PRN
  Administered 2017-11-28: 500 mL

## 2017-11-28 MED ORDER — GLYCOPYRROLATE 0.2 MG/ML IJ SOLN
INTRAMUSCULAR | Status: DC | PRN
  Administered 2017-11-28: 0.2 mg via INTRAVENOUS

## 2017-11-28 MED ORDER — MIDAZOLAM HCL 5 MG/5ML IJ SOLN
INTRAMUSCULAR | Status: DC | PRN
  Administered 2017-11-28: 2 mg via INTRAVENOUS

## 2017-11-28 MED ORDER — PROPOFOL 500 MG/50ML IV EMUL
INTRAVENOUS | Status: DC | PRN
  Administered 2017-11-28: 75 ug/kg/min via INTRAVENOUS
  Administered 2017-11-28: 08:00:00 via INTRAVENOUS

## 2017-11-28 MED ORDER — DEXTROSE 5 % IV SOLN
3.0000 g | INTRAVENOUS | Status: AC
  Administered 2017-11-28: 3 g via INTRAVENOUS
  Filled 2017-11-28: qty 3000

## 2017-11-28 MED ORDER — MUPIROCIN 2 % EX OINT
1.0000 "application " | TOPICAL_OINTMENT | Freq: Once | CUTANEOUS | Status: DC
  Filled 2017-11-28: qty 22

## 2017-11-28 MED ORDER — BUPIVACAINE HCL (PF) 0.25 % IJ SOLN
INTRAMUSCULAR | Status: DC | PRN
  Administered 2017-11-28: 180 mL

## 2017-11-28 MED ORDER — ONDANSETRON HCL 4 MG/2ML IJ SOLN: 4.0000 mg | Freq: Four times a day (QID) | INTRAMUSCULAR | Status: DC | PRN

## 2017-11-28 MED ORDER — BUPIVACAINE HCL (PF) 0.25 % IJ SOLN
INTRAMUSCULAR | Status: AC
  Filled 2017-11-28: qty 30

## 2017-11-28 MED ORDER — SODIUM CHLORIDE 0.9 % IV SOLN
INTRAVENOUS | Status: DC | PRN
  Administered 2017-11-28: 06:00:00 via INTRAVENOUS

## 2017-11-28 SURGICAL SUPPLY — 6 items
BLANKET WARM UNDERBOD FULL ACC (MISCELLANEOUS) ×3 IMPLANT
CABLE SURGICAL S-101-97-12 (CABLE) ×3 IMPLANT
ICD SUBCU MRI EMBLEM A219 (ICD Generator) ×3 IMPLANT
LEAD SUBQU EMBLEM 3501 (Pacemaker) ×3 IMPLANT
PAD DEFIB LIFELINK (PAD) ×6 IMPLANT
TRAY PACEMAKER INSERTION (PACKS) ×3 IMPLANT

## 2017-11-28 NOTE — Discharge Instructions (Signed)
Subcutaneous Cardioverter Defibrillator Implantation, Care After This sheet gives you information about how to care for yourself after your procedure. Your health care provider may also give you more specific instructions. If you have problems or questions, contact your health care provider. What can I expect after the procedure? After the procedure, it is common to have:  Some pain. It may last a few days.  A slight bump under the skin where the subcutaneous implantation cardioverter defibrillator (S-ICD) is. You may be able to feel the device under the skin. This is normal.  Follow these instructions at home: Medicines  Take over-the-counter and prescription medicines only as told by your health care provider.  If you were prescribed an antibiotic medicine, take it as told by your health care provider. Do not stop taking the antibiotic even if you start to feel better. Incision care  Follow instructions from your health care provider about how to take care of your incision. Make sure you: ? Wash your hands with soap and water before you change your bandage (dressing). If soap and water are not available, use hand sanitizer. ? Change your dressing as told by your health care provider. ? Leave stitches (sutures), skin glue, or adhesive strips in place. These skin closures may need to stay in place for 2 weeks or longer. If adhesive strip edges start to loosen and curl up, you may trim the loose edges. Do not remove adhesive strips completely unless your health care provider tells you to do that.  Check your incision area every day for signs of infection. Check for: ? Redness, swelling, or pain. ? Fluid or blood. ? Warmth. ? Pus or a bad smell. Activity  Do not lift anything that is heavier than 10 lb (4.5 kg), or the limit that you are told, until your health care provider says that it is safe.  Avoid sports and any other activity that could cause a hit to the generator or leads. Ask  your health care provider what activities are safe for you and when you may return to your normal activities.  Follow instructions from your health care provider about exercise and sexual activity restrictions after your procedure. Electric and magnetic fields  Tell all health care providers, including your dentist, that you have a defibrillator. They need to know this so they do not give you an MRI scan, which uses strong magnets.  When using your cell phone, hold it to the ear that is on the opposite side from the defibrillator. Do not leave your cell phone in a pocket over the defibrillator.  If you must pass through a metal detector, quickly walk through it. Do not stop under the detector, and do not stand near it.  Avoid places or objects that have a strong electric or magnetic field, including: ? Airport Herbalist. At the airport, tell officials that you have a defibrillator. Your defibrillator ID card will let you be checked in a way that is safe for you and will not damage your defibrillator. Also, do not let a security person wave a magnetic wand near your defibrillator. That can make it stop working. ? Power plants. ? Large electrical generators. ? Anti-theft systems or electronic article surveillance (EAS). ? Radiofrequency transmission towers, such as cell phone and radio towers.  Do not use amateur (ham) radio equipment or electric (arc) welding torches.  Some devices are safe to use if they are held 12 inches (30 cm) or more away from your defibrillator. These  include power tools, lawn mowers, and speakers. If you are not sure if something is safe to use, ask your health care provider.  Do not use MP3 player headphones. They have magnets.  You may safely use electric blankets, heating pads, computers, and microwave ovens. General instructions  Do not take baths, swim, shower, or use a hot tub until your health care provider approves. You may need to take sponge baths  until your health care provider says that you may bathe or shower.  Do not drive until your health care provider approves.  Always keep your defibrillator ID card with you. The card should list the implant date, device model, and manufacturer. Consider wearing a medical alert bracelet or necklace that says that you have an S-ICD.  Do not use any products that contain nicotine or tobacco, such as cigarettes and e-cigarettes. If you need help quitting, ask your health care provider.  Have your defibrillator checked as often as told by your health care provider. Most S-ICDs last for 4-8 years before they need to be replaced. Contact a health care provider if:  You feel one shock in your chest.  You gain weight suddenly.  You have a fever.  You have severe pain, and medicines do not help.  You have redness, swelling, or pain around your incision area.  You have pus or a bad smell coming from your incision area.  You have fluid or blood coming from your incision.  Your incision area feels warm to the touch.  Your heart feels like it is fluttering or skipping beats (heart palpitations).  You feel increased anxiety or depression. Get help right away if:  You feel more than one shock.  You have chest pain.  You have problems breathing or have shortness of breath.  You have dizziness or fainting. Summary  After the procedure, you may have some pain, see a bump under your skin, and feel the device under your skin.  Check your incision area every day for signs of infection.  Be careful around electric and magnetic fields.  Always keep your defibrillator ID card with you. This information is not intended to replace advice given to you by your health care provider. Make sure you discuss any questions you have with your health care provider. Document Released: 07/07/2016 Document Revised: 07/07/2016 Document Reviewed: 07/07/2016 Elsevier Interactive Patient Education  2018  [place]. Moderate Conscious Sedation, Adult, Care After These instructions provide you with information about caring for yourself after your procedure. Your health care provider may also give you more specific instructions. Your treatment has been planned according to current medical practices, but problems sometimes occur. Call your health care provider if you have any problems or questions after your procedure. What can I expect after the procedure? After your procedure, it is common:  To feel sleepy for several hours.  To feel clumsy and have poor balance for several hours.  To have poor judgment for several hours.  To vomit if you eat too soon.  Follow these instructions at home: For at least 24 hours after the procedure:   Do not: ? Participate in activities where you could fall or become injured. ? Drive. ? Use heavy machinery. ? Drink alcohol. ? Take sleeping pills or medicines that cause drowsiness. ? Make important decisions or sign legal documents. ? Take care of children on your own.  Rest. Eating and drinking  Follow the diet recommended by your health care provider.  If you vomit: ?  Drink water, juice, or soup when you can drink without vomiting. ? Make sure you have little or no nausea before eating solid foods. General instructions  Have a responsible adult stay with you until you are awake and alert.  Take over-the-counter and prescription medicines only as told by your health care provider.  If you smoke, do not smoke without supervision.  Keep all follow-up visits as told by your health care provider. This is important. Contact a health care provider if:  You keep feeling nauseous or you keep vomiting.  You feel light-headed.  You develop a rash.  You have a fever. Get help right away if:  You have trouble breathing. This information is not intended to replace advice given to you by your health care provider. Make sure you discuss any  questions you have with your health care provider. Document Released: 01/23/2013 Document Revised: 09/07/2015 Document Reviewed: 07/25/2015 Elsevier Interactive Patient Education  [employer].

## 2017-11-28 NOTE — Interval H&P Note (Signed)
History and Physical Interval Note:  11/28/2017 7:27 AM  [name] [name]  has presented today for surgery, with the diagnosis of hf  The various methods of treatment have been discussed with the patient and family. After consideration of risks, benefits and other options for treatment, the patient has consented to  Procedure(s): SUBQ ICD IMPLANT (N/A) as a surgical intervention .  The patient's history has been reviewed, patient examined, no change in status, stable for surgery.  I have reviewed the patient's chart and labs.  Questions were answered to the patient's satisfaction.     Cristopher Peru

## 2017-11-28 NOTE — Progress Notes (Signed)
Dr Lovena Le in and ok to d/c home at 1600; client up and walked and tolerated well; no voiced understanding

## 2017-11-28 NOTE — Anesthesia Procedure Notes (Signed)
Procedure Name: MAC Date/Time: 11/28/2017 7:43 AM Performed by: Neldon Newport, CRNA Pre-anesthesia Checklist: Timeout performed, Patient identified, Emergency Drugs available, Suction available and Patient being monitored Oxygen Delivery Method: Simple face mask

## 2017-11-28 NOTE — Anesthesia Preprocedure Evaluation (Addendum)
Anesthesia Evaluation  Patient identified by MRN, date of birth, ID band Patient awake    Reviewed: Allergy & Precautions, NPO status , Patient's Chart, lab work & pertinent test results  Airway Mallampati: II  TM Distance: >3 FB     Dental  (+) Dental Advidsory Given, Poor Dentition, Missing,    Pulmonary former smoker,    breath sounds clear to auscultation       Cardiovascular hypertension, + angina + CAD, + CABG and +CHF  + dysrhythmias  Rhythm:Regular Rate:Normal     Neuro/Psych    GI/Hepatic   Endo/Other    Renal/GU ESRF and DialysisRenal disease     Musculoskeletal   Abdominal   Peds  Hematology   Anesthesia Other Findings HD T, Th, Sat  Last dialysis on 11/27/2017.  Reproductive/Obstetrics                            Anesthesia Physical Anesthesia Plan  ASA: III  Anesthesia Plan: MAC   Post-op Pain Management:    Induction: Intravenous  PONV Risk Score and Plan:   Airway Management Planned: Simple Face Mask  Additional Equipment:   Intra-op Plan:   Post-operative Plan:   Informed Consent: I have reviewed the patients History and Physical, chart, labs and discussed the procedure including the risks, benefits and alternatives for the proposed anesthesia with the patient or authorized representative who has indicated his/her understanding and acceptance.   Dental Advisory Given  Plan Discussed with: CRNA  Anesthesia Plan Comments:       Anesthesia Quick Evaluation

## 2017-11-28 NOTE — Transfer of Care (Signed)
Immediate Anesthesia Transfer of Care Note  Patient: [name] [name]  Procedure(s) Performed: Naoma Diener ICD IMPLANT (N/A )  Patient Location: Cath Lab  Anesthesia Type:MAC  Level of Consciousness: awake, alert  and oriented  Airway & Oxygen Therapy: Patient Spontanous Breathing and Patient connected to nasal cannula oxygen  Post-op Assessment: Report given to RN, Post -op Vital signs reviewed and stable, Patient moving all extremities X 4 and Patient able to stick tongue midline  Post vital signs: Reviewed and stable  Last Vitals:  Vitals Value Taken Time  BP    Temp    Pulse    Resp    SpO2      Last Pain:  Vitals:   11/28/17 0554  TempSrc:   PainSc: 0-No pain      Patients Stated Pain Goal: 9 (28/83/37 [id number])  Complications: No apparent anesthesia complications

## 2017-11-28 NOTE — Anesthesia Postprocedure Evaluation (Signed)
Anesthesia Post Note  Patient: [name] [name]  Procedure(s) Performed: SUBQ ICD IMPLANT (N/A )     Patient location during evaluation: PACU Anesthesia Type: MAC Level of consciousness: awake Pain management: pain level controlled Vital Signs Assessment: post-procedure vital signs reviewed and stable Respiratory status: spontaneous breathing Cardiovascular status: stable Anesthetic complications: no    Last Vitals:  Vitals:   11/28/17 0539  BP: (!) 147/89  Pulse: 92  Resp: 18  Temp: 36.9 C  SpO2: 98%    Last Pain:  Vitals:   11/28/17 0554  TempSrc:   PainSc: 0-No pain                 Bren Borys

## 2017-11-29 ENCOUNTER — Encounter (HOSPITAL_COMMUNITY): Payer: Self-pay | Admitting: Internal Medicine

## 2017-11-30 ENCOUNTER — Telehealth: Payer: Self-pay | Admitting: Cardiology

## 2017-11-30 DIAGNOSIS — N186 End stage renal disease: Secondary | ICD-10-CM | POA: Diagnosis not present

## 2017-11-30 DIAGNOSIS — N2581 Secondary hyperparathyroidism of renal origin: Secondary | ICD-10-CM | POA: Diagnosis not present

## 2017-11-30 DIAGNOSIS — Z992 Dependence on renal dialysis: Secondary | ICD-10-CM | POA: Diagnosis not present

## 2017-11-30 DIAGNOSIS — E611 Iron deficiency: Secondary | ICD-10-CM | POA: Diagnosis not present

## 2017-11-30 MED ORDER — ACETAMINOPHEN-CODEINE #3 300-30 MG PO TABS: 1.0000 | ORAL_TABLET | Freq: Two times a day (BID) | ORAL | 0 refills | Status: DC

## 2017-11-30 MED ORDER — ACETAMINOPHEN-CODEINE #3 300-30 MG PO TABS: 1.0000 | ORAL_TABLET | Freq: Two times a day (BID) | ORAL | 0 refills | Status: AC

## 2017-11-30 NOTE — Telephone Encounter (Signed)
Lenetta Quaker calling regarding dressing change at dialysis this morning. No further bleeding. She denies that the area is swollen or reddened. I advised that if he have any more bleeding to call the [facility] directly, number provided. Wound check confirmed on 12/11/17.  She reports that he is having significant pain at the site and inquired about a prescription for pain medication. I will route to Dr. Lovena Le and Sonia Baller for further advisement.

## 2017-11-30 NOTE — Telephone Encounter (Signed)
Per Dr. Lovena Le- Give him Tylenol 3, 1-2 tabs twice daily for 3 days.  12 total no refills.  Called prescription to [facility].  Call placed to Pt wife and advised of prescription.  Wife thanked Marine scientist for call.

## 2017-11-30 NOTE — Telephone Encounter (Signed)
Received page from patient's wife.  He had a subcutaneous ICD placed 2 days ago, today he was having some issues with bleeding at the site.  Dialysis, his dressing was changed.  He continued to have some oozing throughout the day.  He has had enough oozing over the course the day to soak through about 3 gauze pads.  Fortunately, per report it seems that he is not having significant discomfort other than appropriate tenderness at this point postoperatively. According to his wife, does not appear that there is significant bleeding into his pocket.  He is on warfarin given the mechanical AVR.  I advised him to apply direct pressure to the bleeding site with clean dry gauze.  If they continue to have significant bleeding over the next couple of hours despite these conservative measures, I have advised him to present to the emergency department to be evaluated.  If the bleeding stops with these measures, they will plan to follow-up with Dr. Tanna Furry office in the morning.

## 2017-11-30 NOTE — Telephone Encounter (Signed)
Patient wife called and stated that pt went to dialysis today and they changed his guaze.

## 2017-12-01 ENCOUNTER — Ambulatory Visit (INDEPENDENT_AMBULATORY_CARE_PROVIDER_SITE_OTHER): Payer: Medicare Other | Admitting: *Deleted

## 2017-12-01 DIAGNOSIS — Z5181 Encounter for therapeutic drug level monitoring: Secondary | ICD-10-CM

## 2017-12-01 DIAGNOSIS — Z954 Presence of other heart-valve replacement: Secondary | ICD-10-CM | POA: Diagnosis not present

## 2017-12-01 DIAGNOSIS — I5022 Chronic systolic (congestive) heart failure: Secondary | ICD-10-CM

## 2017-12-01 DIAGNOSIS — T82868D Thrombosis of vascular prosthetic devices, implants and grafts, subsequent encounter: Secondary | ICD-10-CM

## 2017-12-01 LAB — POCT INR: INR: 1.3 — AB (ref 2.0–3.0)

## 2017-12-01 NOTE — Progress Notes (Signed)
Patient presents to the office d/t bleeding of his SICD site . Patient states that his device pocket began "oozing blood" Wednesday night, and then worsened on Thursday after dialysis. INR at the hospital was 1.8, warfarin was held x 3day for the procedure - resumed on Wednesday. INR today was 1.3.  Site assessed. Wash cloth and several layers of gauze and tape, covering patient's SICD, were visibly saturated with blood. Dr.Taylor removed the layers of dressing, including the underlying steri strips, and assessed the wound. Incision edges approximated. Wound without redness or hematoma. No active bleeding noted at the site. Steri strips reapplied per Dr.Taylor's instruction. Manual pressure applied x 82mins, no active bleeding noted on gauze or steri strips. Tegaderm applied over gauze. Dr.Taylor recommended that patient continue to take his warfarin as directed by the coumadin clinic d/t mechanical valve and subtherapeutic INR. He also recommended that patient apply pressure x 10 minutes for any additional bleeding following his dialysis appointments (extra gauze and tegaderm provided for at home use). Patient will follow up on 12/06/17 @ 1000 for a wound and INR recheck. Patient and family verbalized understanding of all information.

## 2017-12-02 DIAGNOSIS — Z992 Dependence on renal dialysis: Secondary | ICD-10-CM | POA: Diagnosis not present

## 2017-12-02 DIAGNOSIS — E611 Iron deficiency: Secondary | ICD-10-CM | POA: Diagnosis not present

## 2017-12-02 DIAGNOSIS — N2581 Secondary hyperparathyroidism of renal origin: Secondary | ICD-10-CM | POA: Diagnosis not present

## 2017-12-02 DIAGNOSIS — N186 End stage renal disease: Secondary | ICD-10-CM | POA: Diagnosis not present

## 2017-12-04 ENCOUNTER — Ambulatory Visit: Payer: Self-pay

## 2017-12-05 DIAGNOSIS — Z992 Dependence on renal dialysis: Secondary | ICD-10-CM | POA: Diagnosis not present

## 2017-12-05 DIAGNOSIS — E611 Iron deficiency: Secondary | ICD-10-CM | POA: Diagnosis not present

## 2017-12-05 DIAGNOSIS — N186 End stage renal disease: Secondary | ICD-10-CM | POA: Diagnosis not present

## 2017-12-05 DIAGNOSIS — N2581 Secondary hyperparathyroidism of renal origin: Secondary | ICD-10-CM | POA: Diagnosis not present

## 2017-12-06 ENCOUNTER — Ambulatory Visit (INDEPENDENT_AMBULATORY_CARE_PROVIDER_SITE_OTHER): Payer: Medicare Other | Admitting: *Deleted

## 2017-12-06 ENCOUNTER — Ambulatory Visit (INDEPENDENT_AMBULATORY_CARE_PROVIDER_SITE_OTHER): Payer: Medicare Other | Admitting: Pharmacist

## 2017-12-06 DIAGNOSIS — I255 Ischemic cardiomyopathy: Secondary | ICD-10-CM | POA: Diagnosis not present

## 2017-12-06 DIAGNOSIS — T82868D Thrombosis of vascular prosthetic devices, implants and grafts, subsequent encounter: Secondary | ICD-10-CM

## 2017-12-06 DIAGNOSIS — Z5181 Encounter for therapeutic drug level monitoring: Secondary | ICD-10-CM | POA: Diagnosis not present

## 2017-12-06 DIAGNOSIS — Z95810 Presence of automatic (implantable) cardiac defibrillator: Secondary | ICD-10-CM | POA: Diagnosis not present

## 2017-12-06 DIAGNOSIS — Z954 Presence of other heart-valve replacement: Secondary | ICD-10-CM | POA: Diagnosis not present

## 2017-12-06 LAB — CUP PACEART INCLINIC DEVICE CHECK
Implantable Lead Implant Date: 20190813
Implantable Lead Location: 753862
Implantable Lead Model: 3401
Implantable Lead Serial Number: 154389
Implantable Pulse Generator Implant Date: 20190813
MDC IDC PG SERIAL: 245837
MDC IDC SESS DTM: 20190821110112

## 2017-12-06 LAB — POCT INR: INR: 1.9 — AB (ref 2.0–3.0)

## 2017-12-06 NOTE — Progress Notes (Signed)
S-ICD wound check appointment. Occlusive dressings and Steri-strips removed from left axillary, sub-ziphoid, and sternal incisions. Wounds without redness or edema. Incision edges approximated, wounds healing well. GT assessed sites, recommended washing at least once daily with soap and water. Patient and wife aware to call for any signs/symptoms of infection. Per GT, ok to cancel 12/11/17 wound check appointment.  Normal device function. 0 untreated episodes; 0 treated episodes; 0 shocks delivered. Electrode impedance status okay. No programming changes. Remaining longevity to ERI 100%. Patient educated about wound care, arm mobility, lifting restrictions, shock plan, and Latitude monitor. ROV with GT on 03/09/18.

## 2017-12-06 NOTE — Patient Instructions (Signed)
Description   Today, take 1.5 tablets then continue taking 5mg  daily. Recheck on Wednesdays. 12/01/17-Range 2.0-2.5 for next week per Dr Lovena Le.

## 2017-12-07 DIAGNOSIS — Z992 Dependence on renal dialysis: Secondary | ICD-10-CM | POA: Diagnosis not present

## 2017-12-07 DIAGNOSIS — N2581 Secondary hyperparathyroidism of renal origin: Secondary | ICD-10-CM | POA: Diagnosis not present

## 2017-12-07 DIAGNOSIS — N186 End stage renal disease: Secondary | ICD-10-CM | POA: Diagnosis not present

## 2017-12-07 DIAGNOSIS — E611 Iron deficiency: Secondary | ICD-10-CM | POA: Diagnosis not present

## 2017-12-09 DIAGNOSIS — N186 End stage renal disease: Secondary | ICD-10-CM | POA: Diagnosis not present

## 2017-12-09 DIAGNOSIS — N2581 Secondary hyperparathyroidism of renal origin: Secondary | ICD-10-CM | POA: Diagnosis not present

## 2017-12-09 DIAGNOSIS — Z992 Dependence on renal dialysis: Secondary | ICD-10-CM | POA: Diagnosis not present

## 2017-12-09 DIAGNOSIS — E611 Iron deficiency: Secondary | ICD-10-CM | POA: Diagnosis not present

## 2017-12-11 ENCOUNTER — Ambulatory Visit: Payer: Self-pay

## 2017-12-12 DIAGNOSIS — E611 Iron deficiency: Secondary | ICD-10-CM | POA: Diagnosis not present

## 2017-12-12 DIAGNOSIS — Z992 Dependence on renal dialysis: Secondary | ICD-10-CM | POA: Diagnosis not present

## 2017-12-12 DIAGNOSIS — N2581 Secondary hyperparathyroidism of renal origin: Secondary | ICD-10-CM | POA: Diagnosis not present

## 2017-12-12 DIAGNOSIS — N186 End stage renal disease: Secondary | ICD-10-CM | POA: Diagnosis not present

## 2017-12-14 ENCOUNTER — Ambulatory Visit (INDEPENDENT_AMBULATORY_CARE_PROVIDER_SITE_OTHER): Payer: Medicare Other | Admitting: *Deleted

## 2017-12-14 DIAGNOSIS — Z954 Presence of other heart-valve replacement: Secondary | ICD-10-CM | POA: Diagnosis not present

## 2017-12-14 DIAGNOSIS — Z992 Dependence on renal dialysis: Secondary | ICD-10-CM | POA: Diagnosis not present

## 2017-12-14 DIAGNOSIS — Z5181 Encounter for therapeutic drug level monitoring: Secondary | ICD-10-CM

## 2017-12-14 DIAGNOSIS — T82868D Thrombosis of vascular prosthetic devices, implants and grafts, subsequent encounter: Secondary | ICD-10-CM | POA: Diagnosis not present

## 2017-12-14 DIAGNOSIS — N186 End stage renal disease: Secondary | ICD-10-CM | POA: Diagnosis not present

## 2017-12-14 DIAGNOSIS — E611 Iron deficiency: Secondary | ICD-10-CM | POA: Diagnosis not present

## 2017-12-14 DIAGNOSIS — N2581 Secondary hyperparathyroidism of renal origin: Secondary | ICD-10-CM | POA: Diagnosis not present

## 2017-12-14 LAB — POCT INR: INR: 2.4 (ref 2.0–3.0)

## 2017-12-14 NOTE — Patient Instructions (Signed)
Continue coumadin 5mg  daily. Recheck in 2 weeks AICD insertion site has healed with no more bleeding.

## 2017-12-16 DIAGNOSIS — N2581 Secondary hyperparathyroidism of renal origin: Secondary | ICD-10-CM | POA: Diagnosis not present

## 2017-12-16 DIAGNOSIS — E611 Iron deficiency: Secondary | ICD-10-CM | POA: Diagnosis not present

## 2017-12-16 DIAGNOSIS — Z992 Dependence on renal dialysis: Secondary | ICD-10-CM | POA: Diagnosis not present

## 2017-12-16 DIAGNOSIS — N186 End stage renal disease: Secondary | ICD-10-CM | POA: Diagnosis not present

## 2017-12-19 DIAGNOSIS — Z992 Dependence on renal dialysis: Secondary | ICD-10-CM | POA: Diagnosis not present

## 2017-12-19 DIAGNOSIS — N2581 Secondary hyperparathyroidism of renal origin: Secondary | ICD-10-CM | POA: Diagnosis not present

## 2017-12-19 DIAGNOSIS — E611 Iron deficiency: Secondary | ICD-10-CM | POA: Diagnosis not present

## 2017-12-19 DIAGNOSIS — N186 End stage renal disease: Secondary | ICD-10-CM | POA: Diagnosis not present

## 2017-12-20 NOTE — H&P (Signed)
ICD Criteria  Current LVEF:30%. Within 12 months prior to implant: Yes   Heart failure history: Yes, Class II  Cardiomyopathy history: Yes, Ischemic Cardiomyopathy - Prior MI.  Atrial Fibrillation/Atrial Flutter: No.  Ventricular tachycardia history: No.  Cardiac arrest history: No.  History of syndromes with risk of sudden death: No.  Previous ICD: No.  Current ICD indication: Primary  PPM indication: No.   Class I or II Bradycardia indication present: No  Beta Blocker therapy for 3 or more months: Yes, prescribed.   Ace Inhibitor/ARB therapy for 3 or more months: No, medical reason.

## 2017-12-21 DIAGNOSIS — Z992 Dependence on renal dialysis: Secondary | ICD-10-CM | POA: Diagnosis not present

## 2017-12-21 DIAGNOSIS — E611 Iron deficiency: Secondary | ICD-10-CM | POA: Diagnosis not present

## 2017-12-21 DIAGNOSIS — N2581 Secondary hyperparathyroidism of renal origin: Secondary | ICD-10-CM | POA: Diagnosis not present

## 2017-12-21 DIAGNOSIS — N186 End stage renal disease: Secondary | ICD-10-CM | POA: Diagnosis not present

## 2017-12-23 DIAGNOSIS — N2581 Secondary hyperparathyroidism of renal origin: Secondary | ICD-10-CM | POA: Diagnosis not present

## 2017-12-23 DIAGNOSIS — Z992 Dependence on renal dialysis: Secondary | ICD-10-CM | POA: Diagnosis not present

## 2017-12-23 DIAGNOSIS — E611 Iron deficiency: Secondary | ICD-10-CM | POA: Diagnosis not present

## 2017-12-23 DIAGNOSIS — N186 End stage renal disease: Secondary | ICD-10-CM | POA: Diagnosis not present

## 2017-12-25 ENCOUNTER — Encounter: Payer: Medicare Other | Admitting: Thoracic Surgery (Cardiothoracic Vascular Surgery)

## 2017-12-26 DIAGNOSIS — E611 Iron deficiency: Secondary | ICD-10-CM | POA: Diagnosis not present

## 2017-12-26 DIAGNOSIS — N2581 Secondary hyperparathyroidism of renal origin: Secondary | ICD-10-CM | POA: Diagnosis not present

## 2017-12-26 DIAGNOSIS — Z992 Dependence on renal dialysis: Secondary | ICD-10-CM | POA: Diagnosis not present

## 2017-12-26 DIAGNOSIS — N186 End stage renal disease: Secondary | ICD-10-CM | POA: Diagnosis not present

## 2017-12-28 ENCOUNTER — Telehealth: Payer: Self-pay | Admitting: Cardiology

## 2017-12-28 NOTE — Telephone Encounter (Signed)
Mrs. Mullarkey reports that [name] [name] left chest SICD site is swollen. It was the size of a grapefruit yesterday and is now the size of a cantaloupe. It is tight, he denies fever, chills, drainage. She cannot bring him to the office today but can be here tomorrow. I scheduled an appointment for 9am tomorrow and advised Mrs. Ulysse that if he has fever, chills or drainage to call back. She verbalizes understanding.

## 2017-12-28 NOTE — Telephone Encounter (Signed)
Patient wife called and stated that patient device site is swollen and is about the size of a cantaloupe. Call routed to [place].

## 2017-12-29 ENCOUNTER — Ambulatory Visit (INDEPENDENT_AMBULATORY_CARE_PROVIDER_SITE_OTHER): Payer: Medicare Other | Admitting: *Deleted

## 2017-12-29 DIAGNOSIS — I255 Ischemic cardiomyopathy: Secondary | ICD-10-CM

## 2017-12-29 NOTE — Patient Instructions (Signed)
Medication Instructions:  Your physician recommends that you continue on your current medications as directed. Please refer to the Current Medication list given to you today.  Labwork: None ordered-you will get lab work on the morning of your procedure at the hospital  Testing/Procedures: None ordered.  Follow-Up: You will follow up with the device clinic 10-14 days after your procedure for a wound check.  Any Other Special Instructions Will Be Listed Below (If Applicable).  If you need a refill on your cardiac medications before your next appointment, please call your pharmacy.    Please arrive at the [facility] main entrance of [facility] hospital at:  5:30 am on January 01, 2018 Use the CHG surgical scrub as directed Do not eat or drink after midnight prior to procedure Do not take any medications the morning of the procedure-stop your warfarin today You will be discharged after your procedure You will need someone to drive you home at discharge

## 2017-12-29 NOTE — Progress Notes (Signed)
Left subcutaneous ICD site swollen, approximately the size of a cantaloupe, no bruising noted pt denies fever or chills.  Dr. Lovena Le assessed plans for pocket evacuation on 01/01/2018. Pt educated to hold coumadin on Sat. And Sun. Instructions given for CHG soap and bathing.

## 2017-12-30 DIAGNOSIS — Z992 Dependence on renal dialysis: Secondary | ICD-10-CM | POA: Diagnosis not present

## 2017-12-30 DIAGNOSIS — N2581 Secondary hyperparathyroidism of renal origin: Secondary | ICD-10-CM | POA: Diagnosis not present

## 2017-12-30 DIAGNOSIS — N186 End stage renal disease: Secondary | ICD-10-CM | POA: Diagnosis not present

## 2017-12-30 DIAGNOSIS — E611 Iron deficiency: Secondary | ICD-10-CM | POA: Diagnosis not present

## 2017-12-31 MED ORDER — DEXTROSE 5 % IV SOLN
3.0000 g | INTRAVENOUS | Status: AC
  Administered 2018-01-01: 3 g via INTRAVENOUS
  Filled 2017-12-31: qty 3
  Filled 2017-12-31: qty 3000

## 2017-12-31 MED ORDER — SODIUM CHLORIDE 0.9 % IV SOLN
80.0000 mg | INTRAVENOUS | Status: AC
  Administered 2018-01-01 (×2): 80 mg
  Filled 2017-12-31: qty 2

## 2018-01-01 ENCOUNTER — Other Ambulatory Visit: Payer: Self-pay

## 2018-01-01 ENCOUNTER — Encounter (HOSPITAL_COMMUNITY)
Admission: RE | Disposition: A | Discharge status: Home / Self Care | Payer: Self-pay | Source: Ambulatory Visit | Attending: Internal Medicine

## 2018-01-01 ENCOUNTER — Other Ambulatory Visit (HOSPITAL_COMMUNITY): Payer: Self-pay

## 2018-01-01 ENCOUNTER — Encounter (HOSPITAL_COMMUNITY): Payer: Self-pay | Admitting: Internal Medicine

## 2018-01-01 ENCOUNTER — Ambulatory Visit (HOSPITAL_COMMUNITY)
Admission: RE | Admit: 2018-01-01 | Discharge: 2018-01-01 | Disposition: A | Discharge status: Home / Self Care | Payer: Medicare Other | Source: Ambulatory Visit | Attending: Internal Medicine | Admitting: Internal Medicine

## 2018-01-01 DIAGNOSIS — Z87891 Personal history of nicotine dependence: Secondary | ICD-10-CM | POA: Diagnosis not present

## 2018-01-01 DIAGNOSIS — N186 End stage renal disease: Secondary | ICD-10-CM | POA: Insufficient documentation

## 2018-01-01 DIAGNOSIS — Z992 Dependence on renal dialysis: Secondary | ICD-10-CM | POA: Diagnosis not present

## 2018-01-01 DIAGNOSIS — Z951 Presence of aortocoronary bypass graft: Secondary | ICD-10-CM | POA: Diagnosis not present

## 2018-01-01 DIAGNOSIS — Z7982 Long term (current) use of aspirin: Secondary | ICD-10-CM | POA: Diagnosis not present

## 2018-01-01 DIAGNOSIS — I509 Heart failure, unspecified: Secondary | ICD-10-CM | POA: Diagnosis not present

## 2018-01-01 DIAGNOSIS — Z6841 Body Mass Index (BMI) 40.0 and over, adult: Secondary | ICD-10-CM | POA: Diagnosis not present

## 2018-01-01 DIAGNOSIS — L7632 Postprocedural hematoma of skin and subcutaneous tissue following other procedure: Secondary | ICD-10-CM | POA: Insufficient documentation

## 2018-01-01 DIAGNOSIS — Z952 Presence of prosthetic heart valve: Secondary | ICD-10-CM | POA: Insufficient documentation

## 2018-01-01 DIAGNOSIS — Z7901 Long term (current) use of anticoagulants: Secondary | ICD-10-CM | POA: Insufficient documentation

## 2018-01-01 DIAGNOSIS — Y713 Surgical instruments, materials and cardiovascular devices (including sutures) associated with adverse incidents: Secondary | ICD-10-CM | POA: Insufficient documentation

## 2018-01-01 DIAGNOSIS — I2510 Atherosclerotic heart disease of native coronary artery without angina pectoris: Secondary | ICD-10-CM | POA: Diagnosis not present

## 2018-01-01 DIAGNOSIS — T82837A Hemorrhage of cardiac prosthetic devices, implants and grafts, initial encounter: Secondary | ICD-10-CM | POA: Diagnosis present

## 2018-01-01 DIAGNOSIS — Z95810 Presence of automatic (implantable) cardiac defibrillator: Secondary | ICD-10-CM | POA: Insufficient documentation

## 2018-01-01 DIAGNOSIS — I132 Hypertensive heart and chronic kidney disease with heart failure and with stage 5 chronic kidney disease, or end stage renal disease: Secondary | ICD-10-CM | POA: Insufficient documentation

## 2018-01-01 HISTORY — PX: POCKET REVISION/RELOCATION: EP1222

## 2018-01-01 LAB — BASIC METABOLIC PANEL
Anion gap: 18 — ABNORMAL HIGH (ref 5–15)
BUN: 58 mg/dL — ABNORMAL HIGH (ref 6–20)
CHLORIDE: 95 mmol/L — AB (ref 98–111)
CO2: 26 mmol/L (ref 22–32)
Calcium: 8.8 mg/dL — ABNORMAL LOW (ref 8.9–10.3)
Creatinine, Ser: 11.33 mg/dL — ABNORMAL HIGH (ref 0.61–1.24)
GFR calc Af Amer: 5 mL/min — ABNORMAL LOW (ref >60)
GFR calc non Af Amer: 4 mL/min — ABNORMAL LOW (ref >60)
GLUCOSE: 81 mg/dL (ref 70–99)
POTASSIUM: 4.6 mmol/L (ref 3.5–5.1)
Sodium: 139 mmol/L (ref 135–145)

## 2018-01-01 LAB — CBC
HEMATOCRIT: 39.6 % (ref 39.0–52.0)
HEMOGLOBIN: 11.5 g/dL — AB (ref 13.0–17.0)
MCH: 24.2 pg — ABNORMAL LOW (ref 26.0–34.0)
MCHC: 29 g/dL — ABNORMAL LOW (ref 30.0–36.0)
MCV: 83.2 fL (ref 78.0–100.0)
Platelets: 272 10*3/uL (ref 150–400)
RBC: 4.76 MIL/uL (ref 4.22–5.81)
RDW: 16.6 % — ABNORMAL HIGH (ref 11.5–15.5)
WBC: 9 10*3/uL (ref 4.0–10.5)

## 2018-01-01 LAB — PROTIME-INR
INR: 2.06
Prothrombin Time: 23 seconds — ABNORMAL HIGH (ref 11.4–15.2)

## 2018-01-01 LAB — SURGICAL PCR SCREEN
MRSA, PCR: NEGATIVE
STAPHYLOCOCCUS AUREUS: NEGATIVE

## 2018-01-01 SURGERY — POCKET REVISION/RELOCATION

## 2018-01-01 MED ORDER — SODIUM CHLORIDE 0.9 % IV SOLN
INTRAVENOUS | Status: DC
  Administered 2018-01-01: 06:00:00 via INTRAVENOUS

## 2018-01-01 MED ORDER — BUPIVACAINE HCL (PF) 0.25 % IJ SOLN
INTRAMUSCULAR | Status: DC | PRN
  Administered 2018-01-01: 75 mL

## 2018-01-01 MED ORDER — MUPIROCIN 2 % EX OINT
TOPICAL_OINTMENT | Freq: Once | CUTANEOUS | Status: AC
  Administered 2018-01-01: 06:00:00 via NASAL
  Filled 2018-01-01: qty 22

## 2018-01-01 MED ORDER — CEPHALEXIN 500 MG PO CAPS: 500.0000 mg | ORAL_CAPSULE | Freq: Two times a day (BID) | ORAL | 0 refills | Status: AC

## 2018-01-01 MED ORDER — SODIUM CHLORIDE 0.9 % IV SOLN
INTRAVENOUS | Status: AC
  Filled 2018-01-01: qty 2

## 2018-01-01 MED ORDER — FENTANYL CITRATE (PF) 100 MCG/2ML IJ SOLN
INTRAMUSCULAR | Status: DC | PRN
  Administered 2018-01-01 (×2): 12.5 ug via INTRAVENOUS
  Administered 2018-01-01: 25 ug via INTRAVENOUS

## 2018-01-01 MED ORDER — ONDANSETRON HCL 4 MG/2ML IJ SOLN: 4.0000 mg | Freq: Four times a day (QID) | INTRAMUSCULAR | Status: DC | PRN

## 2018-01-01 MED ORDER — HEPARIN (PORCINE) IN NACL 1000-0.9 UT/500ML-% IV SOLN
INTRAVENOUS | Status: AC
  Filled 2018-01-01: qty 500

## 2018-01-01 MED ORDER — FENTANYL CITRATE (PF) 100 MCG/2ML IJ SOLN
INTRAMUSCULAR | Status: AC
  Filled 2018-01-01: qty 2

## 2018-01-01 MED ORDER — ACETAMINOPHEN 325 MG PO TABS: 325.0000 mg | ORAL_TABLET | ORAL | Status: DC | PRN

## 2018-01-01 MED ORDER — CEFAZOLIN SODIUM-DEXTROSE 1-4 GM/50ML-% IV SOLN
1.0000 g | Freq: Four times a day (QID) | INTRAVENOUS | Status: AC
  Administered 2018-01-01: 1 g via INTRAVENOUS
  Filled 2018-01-01: qty 50

## 2018-01-01 MED ORDER — MIDAZOLAM HCL 5 MG/5ML IJ SOLN
INTRAMUSCULAR | Status: DC | PRN
  Administered 2018-01-01 (×4): 1 mg via INTRAVENOUS

## 2018-01-01 MED ORDER — MIDAZOLAM HCL 5 MG/5ML IJ SOLN
INTRAMUSCULAR | Status: AC
  Filled 2018-01-01: qty 5

## 2018-01-01 MED ORDER — CHLORHEXIDINE GLUCONATE 4 % EX LIQD
60.0000 mL | Freq: Once | CUTANEOUS | Status: DC
  Filled 2018-01-01: qty 60

## 2018-01-01 MED ORDER — BUPIVACAINE HCL (PF) 0.25 % IJ SOLN
INTRAMUSCULAR | Status: AC
  Filled 2018-01-01: qty 120

## 2018-01-01 MED ORDER — HEPARIN (PORCINE) IN NACL 1000-0.9 UT/500ML-% IV SOLN
INTRAVENOUS | Status: DC | PRN
  Administered 2018-01-01: 500 mL

## 2018-01-01 SURGICAL SUPPLY — 3 items
CABLE SURGICAL S-101-97-12 (CABLE) ×3 IMPLANT
PAD PRO RADIOLUCENT 2001M-C (PAD) ×6 IMPLANT
TRAY PACEMAKER INSERTION (PACKS) ×3 IMPLANT

## 2018-01-01 NOTE — Discharge Instructions (Signed)
Site care instructions Keep incision clean and dry, do not shower until cleared to at your wound check visit. Do not remove dressing until seen in the office for wound check appointment. Call the office [phone]) for redness, drainage, swelling, or fever.   Wound Care, Adult Taking care of your wound properly can help to prevent pain and infection. It can also help your wound to heal more quickly. How is this treated? Wound care  Follow instructions from your health care provider about how to take care of your wound. Make sure you: ? Wash your hands with soap and water before you change the bandage (dressing). If soap and water are not available, use hand sanitizer. ? Change your dressing as told by your health care provider. ? Leave stitches (sutures), skin glue, or adhesive strips in place. These skin closures may need to stay in place for 2 weeks or longer. If adhesive strip edges start to loosen and curl up, you may trim the loose edges. Do not remove adhesive strips completely unless your health care provider tells you to do that.  Check your wound area every day for signs of infection. Check for: ? More redness, swelling, or pain. ? More fluid or blood. ? Warmth. ? Pus or a bad smell.  Ask your health care provider if you should clean the wound with mild soap and water. Doing this may include: ? Using a clean towel to pat the wound dry after cleaning it. Do not rub or scrub the wound. ? Applying a cream or ointment. Do this only as told by your health care provider. ? Covering the incision with a clean dressing.  Ask your health care provider when you can leave the wound uncovered. Medicines   If you were prescribed an antibiotic medicine, cream, or ointment, take or use the antibiotic as told by your health care provider. Do not stop taking or using the antibiotic even if your condition improves.  Take over-the-counter and prescription medicines only as told by your health  care provider. If you were prescribed pain medicine, take it at least 30 minutes before doing any wound care or as told by your health care provider. General instructions  Return to your normal activities as told by your health care provider. Ask your health care provider what activities are safe.  Do not scratch or pick at the wound.  Keep all follow-up visits as told by your health care provider. This is important.  Eat a diet that includes protein, vitamin A, vitamin C, and other nutrient-rich foods. These help the wound heal: ? Protein-rich foods include meat, dairy, beans, nuts, and other sources. ? Vitamin A-rich foods include carrots and dark green, leafy vegetables. ? Vitamin C-rich foods include citrus, tomatoes, and other fruits and vegetables. ? Nutrient-rich foods have protein, carbohydrates, fat, vitamins, or minerals. Eat a variety of healthy foods including vegetables, fruits, and whole grains. Contact a health care provider if:  You received a tetanus shot and you have swelling, severe pain, redness, or bleeding at the injection site.  Your pain is not controlled with medicine.  You have more redness, swelling, or pain around the wound.  You have more fluid or blood coming from the wound.  Your wound feels warm to the touch.  You have pus or a bad smell coming from the wound.  You have a fever or chills.  You are nauseous or you vomit.  You are dizzy. Get help right away if:  You have  a red streak going away from your wound.  The edges of the wound open up and separate.  Your wound is bleeding and the bleeding does not stop with gentle pressure.  You have a rash.  You faint.  You have trouble breathing. This information is not intended to replace advice given to you by your health care provider. Make sure you discuss any questions you have with your health care provider. Document Released: 01/12/2008 Document Revised: 12/02/2015 Document Reviewed:  10/20/2015 Elsevier Interactive Patient Education  [address].  Moderate Conscious Sedation, Adult, Care After These instructions provide you with information about caring for yourself after your procedure. Your health care provider may also give you more specific instructions. Your treatment has been planned according to current medical practices, but problems sometimes occur. Call your health care provider if you have any problems or questions after your procedure. What can I expect after the procedure? After your procedure, it is common:  To feel sleepy for several hours.  To feel clumsy and have poor balance for several hours.  To have poor judgment for several hours.  To vomit if you eat too soon.  Follow these instructions at home: For at least 24 hours after the procedure:   Do not: ? Participate in activities where you could fall or become injured. ? Drive. ? Use heavy machinery. ? Drink alcohol. ? Take sleeping pills or medicines that cause drowsiness. ? Make important decisions or sign legal documents. ? Take care of children on your own.  Rest. Eating and drinking  Follow the diet recommended by your health care provider.  If you vomit: ? Drink water, juice, or soup when you can drink without vomiting. ? Make sure you have little or no nausea before eating solid foods. General instructions  Have a responsible adult stay with you until you are awake and alert.  Take over-the-counter and prescription medicines only as told by your health care provider.  If you smoke, do not smoke without supervision.  Keep all follow-up visits as told by your health care provider. This is important. Contact a health care provider if:  You keep feeling nauseous or you keep vomiting.  You feel light-headed.  You develop a rash.  You have a fever. Get help right away if:  You have trouble breathing. This information is not intended to replace advice given to you  by your health care provider. Make sure you discuss any questions you have with your health care provider. Document Released: 01/23/2013 Document Revised: 09/07/2015 Document Reviewed: 07/25/2015 Elsevier Interactive Patient Education  [employer].

## 2018-01-01 NOTE — H&P (Signed)
[name] [name] is an 52 y.o. male.   Chief Complaint: "I am here to have the blood removed from around my ICD"  HPI: [name] returns today to have his ICD pocket hematoma evacuated. He underwent ICD insertion for primary prevention of malignant ventricular arrhythmias a month ago.  He is a pleasant 52 yo man with ESRD on HD. He developed aortic valve disease and underwent AVR a couple of months ago. He did very well postop. He denies chest pain or sob. He has not had syncope. No edema. He is not having symptoms during HD. He did well after his device insertion for 3-4 weeks and then had swelling around the device. He denies fevers, chills, redness or pain. No drainage.   Past Medical History:  Diagnosis Date  . Aneurysm ([place])     Right arm fistula 3 aneurysms 2011,   plans to have a new procedure  . Angina   . Chest discomfort   . CHF (congestive heart failure) ([place])   . Coronary artery disease   . Coronary artery disease involving native coronary artery of native heart without angina pectoris   . Dialysis patient ([place])   . Dysrhythmia   . ESRD (end stage renal disease) ([place])    on hemodialysis [id number]  . Hypertension   . Leg pain   . Mitral regurgitation   . Morbid obesity ([place])   . Orthostatic hypotension   . Overweight(278.02)   . Renal insufficiency   . 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 Severe aortic insufficiency   . Sinus tachycardia   . Syncope     positional after dialysis... 2008    Past Surgical History:  Procedure Laterality Date  . A/V FISTULAGRAM Left 06/30/2016   Procedure: A/V Fistulagram;  Surgeon: Algernon Huxley, MD;  Location: [place] [facility];  Service: Cardiovascular;  Laterality: Left;  . AORTIC VALVE REPLACEMENT N/A 09/01/2017   Procedure: AORTIC VALVE REPLACEMENT (AVR);  Surgeon: [name], MD;  Location: [place];  Service: Open Heart Surgery;  Laterality: N/A;  . ARTERIOVENOUS GRAFT PLACEMENT    . AV FISTULA PLACEMENT    . AV FISTULA PLACEMENT Left 11/18/2015   Procedure: ARTERIOVENOUS (AV) FISTULA CREATION ( RADIOCEPHALIC );  Surgeon: Algernon Huxley, MD;  Location: [facility];  Service: Vascular;  Laterality: Left;  . AV FISTULA PLACEMENT Left 03/23/2016   Procedure: ARTERIOVENOUS (AV) FISTULA CREATION ( REVISION );  Surgeon: Algernon Huxley, MD;  Location: [facility];  Service: Vascular;  Laterality: Left;  . [place] REMOVAL Left 03/23/2016   Procedure: REMOVAL OF ARTERIOVENOUS GORETEX GRAFT (Garza-[place] II);  Surgeon: Algernon Huxley, MD;  Location: [facility];  Service: Vascular;  Laterality: Left;  . CENTRAL LINE INSERTION Right 09/01/2017   Procedure: FLOROSCOPY GUIDED PLACEMENT OF RIGHT FEMORAL CENTRAL LINE TIMES TWO  AND PLACEMENT OF SWAN GANZ CATHETER;  Surgeon: [name], MD;  Location: [place];  Service: Open Heart Surgery;  Laterality: Right;  . CORONARY ARTERY BYPASS GRAFT N/A 09/01/2017   Procedure: CORONARY ARTERY BYPASS GRAFTING (CABG) X 1, USING RIGHT GREATER SAPHENOUS VEIN HARVESTED ENDOSCOPICALLY;  Surgeon: [name], MD;  Location: [place];  Service: Open Heart Surgery;  Laterality: N/A;  . DIALYSIS/PERMA CATHETER REMOVAL N/A 08/01/2016   Procedure: Dialysis/Perma Catheter Removal;  Surgeon: Algernon Huxley, MD;  Location: [facility];  Service: Cardiovascular;  Laterality: N/A;  . HERNIA REPAIR    . INSERTION OF DIALYSIS CATHETER  03/01/2011   Procedure: INSERTION OF DIALYSIS CATHETER;  Surgeon: Elam Dutch, MD;  Location: [facility];  Service: Vascular;  Laterality: Left;  Exchange of Dialysis Catheter to 27cm 15Fr. Arrow Catheter  . INSERTION OF DIALYSIS CATHETER  09/01/2017   Procedure: PLACEMENT OF TRIALYSIS SHORT TERM DIALYSIS CATHETER;  Surgeon: [name], MD;  Location: [facility];  Service: Open Heart Surgery;;  . MULTIPLE EXTRACTIONS WITH ALVEOLOPLASTY N/A 08/07/2017   Procedure: Extraction  of tooth #2, 7,8,13,14,15,17, and 31 with alveoloplasty and gross debridement of remaining teeth;  Surgeon: [name], DDS;  Location: [place];  Service: Oral Surgery;  Laterality: N/A;  . PERIPHERAL VASCULAR CATHETERIZATION N/A 09/01/2014   Procedure: A/V Shuntogram/Fistulagram;  Surgeon: Algernon Huxley, MD;  Location: [facility];  Service: Cardiovascular;  Laterality: N/A;  . PERIPHERAL VASCULAR CATHETERIZATION Right 09/01/2014   Procedure: Thrombectomy;  Surgeon: Algernon Huxley, MD;  Location: [facility];  Service: Cardiovascular;  Laterality: Right;  . PERIPHERAL VASCULAR CATHETERIZATION Right 09/01/2014   Procedure: A/V Shunt Intervention;  Surgeon: Algernon Huxley, MD;  Location: [facility] [place] [facility];  Service: Cardiovascular;  Laterality: Right;  . PERIPHERAL VASCULAR CATHETERIZATION N/A 09/17/2014   Procedure: A/V Shuntogram/Fistulagram;  Surgeon: Algernon Huxley, MD;  Location: [facility] [facility];  Service: Cardiovascular;  Laterality: N/A;  . PERIPHERAL VASCULAR CATHETERIZATION Right 10/17/2014   Procedure: Thrombectomy;  Surgeon: Katha Cabal, MD;  Location: [facility];  Service: Cardiovascular;  Laterality: Right;  . PERIPHERAL VASCULAR CATHETERIZATION N/A 10/17/2014   Procedure: A/V Shuntogram/Fistulagram;  Surgeon: Katha Cabal, MD;  Location: [facility];  Service: Cardiovascular;  Laterality: N/A;  . PERIPHERAL VASCULAR CATHETERIZATION N/A 10/17/2014   Procedure: A/V Shunt Intervention;  Surgeon: Katha Cabal, MD;  Location: [facility];  Service: Cardiovascular;  Laterality: N/A;  . PERIPHERAL VASCULAR CATHETERIZATION Right 11/04/2014   Procedure: Thrombectomy;  Surgeon: Katha Cabal, MD;  Location: [place] [facility];  Service: Cardiovascular;  Laterality: Right;  . PERIPHERAL VASCULAR CATHETERIZATION Left 11/04/2014   Procedure: Visceral Venography;  Surgeon: Katha Cabal, MD;  Location: [place];  Service:  Cardiovascular;  Laterality: Left;  . PERIPHERAL VASCULAR CATHETERIZATION Right 11/27/2014   Procedure: A/V Shuntogram/Fistulagram;  Surgeon: Algernon Huxley, MD;  Location: [facility] [facility];  Service: Cardiovascular;  Laterality: Right;  . PERIPHERAL VASCULAR CATHETERIZATION Right 11/27/2014   Procedure: A/V Shunt Intervention;  Surgeon: Algernon Huxley, MD;  Location: [facility] [facility];  Service: Cardiovascular;  Laterality: Right;  . PERIPHERAL VASCULAR CATHETERIZATION Right 12/31/2014   Procedure: Thrombectomy;  Surgeon: Algernon Huxley, MD;  Location: [facility];  Service: Cardiovascular;  Laterality: Right;  . PERIPHERAL VASCULAR CATHETERIZATION Right 01/12/2015   Procedure: A/V Shuntogram/Fistulagram;  Surgeon: Algernon Huxley, MD;  Location: [facility];  Service: Cardiovascular;  Laterality: Right;  . PERIPHERAL VASCULAR CATHETERIZATION N/A 01/12/2015   Procedure: A/V Shunt Intervention;  Surgeon: Algernon Huxley, MD;  Location: [facility];  Service: Cardiovascular;  Laterality: N/A;  . PERIPHERAL VASCULAR CATHETERIZATION Right 01/28/2015   Procedure: Thrombectomy;  Surgeon: Algernon Huxley, MD;  Location: [place] [facility];  Service: Cardiovascular;  Laterality: Right;  . PERIPHERAL VASCULAR CATHETERIZATION N/A 06/08/2015   Procedure: A/V Shuntogram/Fistulagram;  Surgeon: Corene Cornea  Bunnie Domino, MD;  Location: [place] [facility];  Service: Cardiovascular;  Laterality: N/A;  . PERIPHERAL VASCULAR CATHETERIZATION N/A 06/08/2015   Procedure: A/V Shunt Intervention;  Surgeon: Algernon Huxley, MD;  Location: [facility];  Service: Cardiovascular;  Laterality: N/A;  . PERIPHERAL VASCULAR CATHETERIZATION Right 07/06/2015   Procedure: A/V Shuntogram/Fistulagram;  Surgeon: Algernon Huxley, MD;  Location: [place] [facility];  Service: Cardiovascular;  Laterality: Right;  . PERIPHERAL VASCULAR CATHETERIZATION N/A 07/06/2015   Procedure: A/V Shunt Intervention;  Surgeon: Algernon Huxley, MD;  Location:  [place];  Service: Cardiovascular;  Laterality: N/A;  . PERIPHERAL VASCULAR CATHETERIZATION N/A 08/04/2015   Procedure: graft declot;  Surgeon: Katha Cabal, MD;  Location: [place] [facility];  Service: Cardiovascular;  Laterality: N/A;  . PERIPHERAL VASCULAR CATHETERIZATION Right 08/25/2015   Procedure: A/V Shuntogram/Fistulagram;  Surgeon: Katha Cabal, MD;  Location: [place] [facility];  Service: Cardiovascular;  Laterality: Right;  . PERIPHERAL VASCULAR CATHETERIZATION N/A 11/04/2015   Procedure: Dialysis/Perma Catheter Insertion;  Surgeon: Algernon Huxley, MD;  Location: [facility] [facility];  Service: Cardiovascular;  Laterality: N/A;  . PERIPHERAL VASCULAR CATHETERIZATION Left 12/14/2015   Procedure: A/V Shuntogram/Fistulagram;  Surgeon: Algernon Huxley, MD;  Location: [place];  Service: Cardiovascular;  Laterality: Left;  . PERIPHERAL VASCULAR CATHETERIZATION N/A 12/14/2015   Procedure: A/V Shunt Intervention;  Surgeon: Algernon Huxley, MD;  Location: [place] [facility] [facility];  Service: Cardiovascular;  Laterality: N/A;  . PERIPHERAL VASCULAR CATHETERIZATION N/A 12/17/2015   Procedure: Dialysis/Perma Catheter Insertion;  Surgeon: Algernon Huxley, MD;  Location: [place];  Service: Cardiovascular;  Laterality: N/A;  . PERIPHERAL VASCULAR CATHETERIZATION Left 12/22/2015   Procedure: Dialysis/Perma Catheter Insertion;  Surgeon: Katha Cabal, MD;  Location: [facility];  Service: Cardiovascular;  Laterality: Left;  . PERIPHERAL VASCULAR CATHETERIZATION Left 02/29/2016   Procedure: A/V Shuntogram/Fistulagram;  Surgeon: Algernon Huxley, MD;  Location: [facility];  Service: Cardiovascular;  Laterality: Left;  . PERIPHERAL VASCULAR CATHETERIZATION N/A 02/29/2016   Procedure: A/V Shunt Intervention;  Surgeon: Algernon Huxley, MD;  Location: [facility] [facility];  Service: Cardiovascular;  Laterality: N/A;  . right arm graft     for dyalisis  . RIGHT/LEFT  HEART CATH AND CORONARY ANGIOGRAPHY N/A 08/02/2017   Procedure: RIGHT/LEFT HEART CATH AND CORONARY ANGIOGRAPHY;  Surgeon: Larey Dresser, MD;  Location: [facility];  Service: Cardiovascular;  Laterality: N/A;  . SUBQ ICD IMPLANT N/A 11/28/2017   Procedure: SUBQ ICD IMPLANT;  Surgeon: Evans Lance, MD;  Location: [place] [facility];  Service: Cardiovascular;  Laterality: N/A;  . TEE WITHOUT CARDIOVERSION N/A 07/31/2017   Procedure: TRANSESOPHAGEAL ECHOCARDIOGRAM (TEE);  Surgeon: Josue Hector, MD;  [facility];  Service: Cardiovascular;  Laterality: N/A;  . TEE WITHOUT CARDIOVERSION N/A 09/01/2017   Procedure: TRANSESOPHAGEAL ECHOCARDIOGRAM (TEE);  Surgeon: [name], MD;  Location: [place];  Service: Open Heart Surgery;  Laterality: N/A;  . THROMBECTOMY    . THROMBECTOMY W/ EMBOLECTOMY  03/01/2011   Procedure: THROMBECTOMY ARTERIOVENOUS GORE-TEX GRAFT;  Surgeon: Elam Dutch, MD;  Location: [place];  Service: Vascular;  Laterality: Right;  Attempted Thrombectomy of Old  Right Upper Arm Arteriovenous gortex Graft. Insertion of new Arteriovenous Graft using 54mm x 50cm Gortex Stretch graft.   . THROMBECTOMY W/ EMBOLECTOMY  07/11/2011   Procedure: THROMBECTOMY ARTERIOVENOUS GORE-TEX GRAFT;  Surgeon: Sherren Mocha  Katina Dung, MD;  Location: [facility];  Service: Vascular;  Laterality: Right;  . UMBILICAL HERNIA REPAIR    . VENOGRAM N/A 08/23/2011   Procedure: VENOGRAM;  Surgeon: [name], MD;  Location[facility];  Service: Cardiovascular;  Laterality: N/A;    Family History  Problem Relation Age of Onset  . Hypertension Mother   . Heart disease Mother   . Hypertension Father   . Heart disease Father    Social History:  reports that he quit smoking about 23 years ago. His smoking use included cigarettes. His smokeless tobacco use includes snuff. He reports that he does not drink alcohol or use drugs.  Allergies: No Known Allergies  Medications Prior to Admission  Medication  Sig Dispense Refill  . amiodarone (PACERONE) 200 MG tablet Take 200 mg by mouth daily.    [name] aspirin EC 81 MG EC tablet Take 1 tablet (81 mg total) by mouth daily.    [name] atorvastatin (LIPITOR) 80 MG tablet Take 1 tablet (80 mg total) by mouth daily at 6 PM. 90 tablet 3  . B Complex-C-Folic Acid (NEPHRO-VITE PO) Take 1 tablet by mouth daily.     . chlorhexidine (PERIDEX) 0.12 % solution Use as directed 15 mLs in the mouth or throat 3 (three) times daily as needed.     [name] FOSRENOL 500 MG chewable tablet Chew 1,500-2,000 mg by mouth See admin instructions. Take 2000 mg by mouth with meals and take 1500 mg by mouth with snacks    . metoprolol tartrate (LOPRESSOR) 25 MG tablet Take 1 tablet (25 mg total) by mouth 2 (two) times daily. 60 tablet 3  . midodrine (PROAMATINE) 5 MG tablet Take 3 tablets (15 mg total) by mouth 3 (three) times daily with meals. 90 tablet 2  . oxyCODONE (OXY IR/ROXICODONE) 5 MG immediate release tablet Take 1 tablet (5 mg total) by mouth every 4 (four) hours as needed for severe pain. 30 tablet 0  . warfarin (COUMADIN) 5 MG tablet Take 1 tablet daily except 1/2 tablet on Tuesdays, Thursdays and Saturdays or as directed by coumadin clinic (Patient taking differently: Take 5 mg by mouth daily. ) 90 tablet 1    No results found for this or any previous visit (from the past 48 hour(s)). No results found.  ROS  Blood pressure (!) 152/84, pulse 90, temperature 97.9 F (36.6 C), temperature source Oral, resp. rate 16, height 6' (1.829 m), weight (!) 140.6 kg, SpO2 100 %. Physical Exam  Well appearing NAD HEENT: Unremarkable,[place], AT Neck:  6 JVD, no thyromegally Back:  No CVA tenderness Lungs:  Clear with no wheezes, rales, or rhonchi HEART:  Regular rate rhythm, no murmurs, no rubs, no clicks Abd:  soft, positive bowel sounds, no organomegally, no rebound, no guarding, massive hematoma Ext:  2 plus pulses, no edema, no cyanosis, no clubbing Skin:  No rashes no nodules Neuro:   CN II through XII intact, motor grossly intact  Assessment/Plan 1. Massive pocket hematoma - he will undergo SQ ICD pocket hematoma evacuation. I have discussed the indications/risk/benefits/goals/expectations and he wishes to proceed.  Cristopher Peru 01/01/2018, 6:44 AM

## 2018-01-02 DIAGNOSIS — E611 Iron deficiency: Secondary | ICD-10-CM | POA: Diagnosis not present

## 2018-01-02 DIAGNOSIS — N186 End stage renal disease: Secondary | ICD-10-CM | POA: Diagnosis not present

## 2018-01-02 DIAGNOSIS — N2581 Secondary hyperparathyroidism of renal origin: Secondary | ICD-10-CM | POA: Diagnosis not present

## 2018-01-02 DIAGNOSIS — Z992 Dependence on renal dialysis: Secondary | ICD-10-CM | POA: Diagnosis not present

## 2018-01-04 DIAGNOSIS — E611 Iron deficiency: Secondary | ICD-10-CM | POA: Diagnosis not present

## 2018-01-04 DIAGNOSIS — N186 End stage renal disease: Secondary | ICD-10-CM | POA: Diagnosis not present

## 2018-01-04 DIAGNOSIS — N2581 Secondary hyperparathyroidism of renal origin: Secondary | ICD-10-CM | POA: Diagnosis not present

## 2018-01-04 DIAGNOSIS — Z992 Dependence on renal dialysis: Secondary | ICD-10-CM | POA: Diagnosis not present

## 2018-01-05 ENCOUNTER — Ambulatory Visit (INDEPENDENT_AMBULATORY_CARE_PROVIDER_SITE_OTHER): Payer: Medicare Other | Admitting: Internal Medicine

## 2018-01-05 ENCOUNTER — Encounter: Payer: Self-pay | Admitting: Internal Medicine

## 2018-01-05 VITALS — BP 138/84 | HR 75 | Ht 72.0 in | Wt 304.0 lb

## 2018-01-05 DIAGNOSIS — I255 Ischemic cardiomyopathy: Secondary | ICD-10-CM

## 2018-01-05 DIAGNOSIS — Z951 Presence of aortocoronary bypass graft: Secondary | ICD-10-CM

## 2018-01-05 DIAGNOSIS — T82837D Hemorrhage of cardiac prosthetic devices, implants and grafts, subsequent encounter: Secondary | ICD-10-CM

## 2018-01-05 DIAGNOSIS — I5022 Chronic systolic (congestive) heart failure: Secondary | ICD-10-CM

## 2018-01-05 MED ORDER — ACETAMINOPHEN-CODEINE #3 300-30 MG PO TABS: 1.0000 | ORAL_TABLET | Freq: Four times a day (QID) | ORAL | 0 refills | Status: DC | PRN

## 2018-01-05 NOTE — Patient Instructions (Addendum)
Medication Instructions:  Your physician recommends that you continue on your current medications as directed. Please refer to the Current Medication list given to you today.  Labwork: None ordered.  Testing/Procedures: None ordered.  Follow-Up:   Device clinic January 09, 2018 at 2:00 pm   Any Other Special Instructions Will Be Listed Below (If Applicable).  If you need a refill on your cardiac medications before your next appointment, please call your pharmacy.

## 2018-01-05 NOTE — Progress Notes (Signed)
HPI [name] returns today for ongoing management of his ICD. He is pleasant obese 52 yo man with ESRD on HD. He has a longstanding ICM, s/p CABG, chronic systolic heart failure who underwent SICD several weeks ago. He initially did well but then developed a pocket hematoma which was massive and underwent evacuation several days ago. The patient returns for incision check. He has started back his warfarin. He is s/p AVR with a  Mechanical prosthesis.  No Known Allergies   Current Outpatient Medications  Medication Sig Dispense Refill  . acetaminophen-codeine (TYLENOL #3) 300-30 MG tablet Take 1-2 tablets by mouth every 6 (six) hours as needed for moderate pain. 20 tablet 0  . amiodarone (PACERONE) 200 MG tablet Take 200 mg by mouth daily.    [name] aspirin EC 81 MG EC tablet Take 1 tablet (81 mg total) by mouth daily.    [name] atorvastatin (LIPITOR) 80 MG tablet Take 1 tablet (80 mg total) by mouth daily at 6 PM. 90 tablet 3  . B Complex-C-Folic Acid (NEPHRO-VITE PO) Take 1 tablet by mouth daily.     . cephALEXin (KEFLEX) 500 MG capsule Take 1 capsule (500 mg total) by mouth 2 (two) times daily for 7 days. 14 capsule 0  . chlorhexidine (PERIDEX) 0.12 % solution Use as directed 15 mLs in the mouth or throat 3 (three) times daily as needed.     [name] FOSRENOL 500 MG chewable tablet Chew 1,500-2,000 mg by mouth See admin instructions. Take 2000 mg by mouth with meals and take 1500 mg by mouth with snacks    . metoprolol tartrate (LOPRESSOR) 25 MG tablet Take 1 tablet (25 mg total) by mouth 2 (two) times daily. 60 tablet 3  . midodrine (PROAMATINE) 5 MG tablet Take 3 tablets (15 mg total) by mouth 3 (three) times daily with meals. 90 tablet 2  . oxyCODONE (OXY IR/ROXICODONE) 5 MG immediate release tablet Take 1 tablet (5 mg total) by mouth every 4 (four) hours as needed for severe pain. 30 tablet 0  . warfarin (COUMADIN) 5 MG tablet Take 1 tablet daily except 1/2 tablet on Tuesdays, Thursdays and  Saturdays or as directed by coumadin clinic (Patient taking differently: Take 5 mg by mouth daily. ) 90 tablet 1   No current facility-administered medications for this visit.      Past Medical History:  Diagnosis Date  . Aneurysm ([place])     Right arm fistula 3 aneurysms 2011,   plans to have a new procedure  . Angina   . Chest discomfort   . CHF (congestive heart failure) ([place])   . Coronary artery disease   . Coronary artery disease involving native coronary artery of native heart without angina pectoris   . Dialysis patient ([place])   . Dysrhythmia   . ESRD (end stage renal disease) ([place])    on hemodialysis T_T_S  . Hypertension   . Leg pain   . Mitral regurgitation   . Morbid obesity ([place])   . Orthostatic hypotension   . Overweight(278.02)   . Renal insufficiency   . 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 Severe aortic insufficiency   . Sinus tachycardia   . Syncope     positional after dialysis... 2008    ROS:   All systems reviewed and negative except  as noted in the HPI.   Past Surgical History:  Procedure Laterality Date  . A/V FISTULAGRAM Left 06/30/2016   Procedure: A/V Fistulagram;  Surgeon: Algernon Huxley, MD;  Location: [facility];  Service: Cardiovascular;  Laterality: Left;  . AORTIC VALVE REPLACEMENT N/A 09/01/2017   Procedure: AORTIC VALVE REPLACEMENT (AVR);  Surgeon: [name], MD;  Location: [employer];  Service: Open Heart Surgery;  Laterality: N/A;  . ARTERIOVENOUS GRAFT PLACEMENT    . AV FISTULA PLACEMENT    . AV FISTULA PLACEMENT Left 11/18/2015   Procedure: ARTERIOVENOUS (AV) FISTULA CREATION ( RADIOCEPHALIC );  Surgeon: Algernon Huxley, MD;  Location: [facility];  Service: Vascular;  Laterality: Left;  . AV FISTULA PLACEMENT Left 03/23/2016   Procedure: ARTERIOVENOUS (AV) FISTULA CREATION ( REVISION );  Surgeon:  Algernon Huxley, MD;  Location: [facility];  Service: Vascular;  Laterality: Left;  . [place] REMOVAL Left 03/23/2016   Procedure: REMOVAL OF ARTERIOVENOUS GORETEX GRAFT ([place]);  Surgeon: Algernon Huxley, MD;  Location: [facility];  Service: Vascular;  Laterality: Left;  . CENTRAL LINE INSERTION Right 09/01/2017   Procedure: FLOROSCOPY GUIDED PLACEMENT OF RIGHT FEMORAL CENTRAL LINE TIMES TWO  AND PLACEMENT OF SWAN GANZ CATHETER;  Surgeon: [name], MD;  Location: [place];  Service: Open Heart Surgery;  Laterality: Right;  . CORONARY ARTERY BYPASS GRAFT N/A 09/01/2017   Procedure: CORONARY ARTERY BYPASS GRAFTING (CABG) X 1, USING RIGHT GREATER SAPHENOUS VEIN HARVESTED ENDOSCOPICALLY;  Surgeon: [name], MD;  Location: [place];  Service: Open Heart Surgery;  Laterality: N/A;  . DIALYSIS/PERMA CATHETER REMOVAL N/A 08/01/2016   Procedure: Dialysis/Perma Catheter Removal;  Surgeon: Algernon Huxley, MD;  Location: [facility];  Service: Cardiovascular;  Laterality: N/A;  . HERNIA REPAIR    . INSERTION OF DIALYSIS CATHETER  03/01/2011   Procedure: INSERTION OF DIALYSIS CATHETER;  Surgeon: Elam Dutch, MD;  Location: [facility];  Service: Vascular;  Laterality: Left;  Exchange of Dialysis Catheter to 27cm 15Fr. Arrow Catheter  . INSERTION OF DIALYSIS CATHETER  09/01/2017   Procedure: PLACEMENT OF TRIALYSIS SHORT TERM DIALYSIS CATHETER;  Surgeon: [name], MD;  Location: [facility];  Service: Open Heart Surgery;;  . MULTIPLE EXTRACTIONS WITH ALVEOLOPLASTY N/A 08/07/2017   Procedure: Extraction of tooth #2, 7,8,13,14,15,17, and 31 with alveoloplasty and gross debridement of remaining teeth;  Surgeon: [name], DDS;  Location: [facility];  Service: Oral Surgery;  Laterality: N/A;  . PERIPHERAL VASCULAR CATHETERIZATION N/A 09/01/2014   Procedure: A/V Shuntogram/Fistulagram;  Surgeon: Algernon Huxley, MD;  Location: [facility];  Service: Cardiovascular;  Laterality: N/A;  . PERIPHERAL VASCULAR  CATHETERIZATION Right 09/01/2014   Procedure: Thrombectomy;  Surgeon: Algernon Huxley, MD;  Location: [place];  Service: Cardiovascular;  Laterality: Right;  . PERIPHERAL VASCULAR CATHETERIZATION Right 09/01/2014   Procedure: A/V Shunt Intervention;  Surgeon: Algernon Huxley, MD;  Location: [facility];  Service: Cardiovascular;  Laterality: Right;  . PERIPHERAL VASCULAR CATHETERIZATION N/A 09/17/2014   Procedure: A/V Shuntogram/Fistulagram;  Surgeon: Algernon Huxley, MD;  Location: [facility];  Service: Cardiovascular;  Laterality: N/A;  . PERIPHERAL VASCULAR CATHETERIZATION Right 10/17/2014   Procedure: Thrombectomy;  Surgeon: Katha Cabal, MD;  Location: [place] [facility];  Service: Cardiovascular;  Laterality: Right;  . PERIPHERAL VASCULAR CATHETERIZATION N/A 10/17/2014   Procedure: A/V Shuntogram/Fistulagram;  Surgeon: Katha Cabal, MD;  [facility];  Service: Cardiovascular;  Laterality: N/A;  . PERIPHERAL VASCULAR CATHETERIZATION N/A 10/17/2014   Procedure: A/V Shunt Intervention;  Surgeon: Katha Cabal, MD;  Location: [place] [facility];  Service: Cardiovascular;  Laterality: N/A;  . PERIPHERAL VASCULAR CATHETERIZATION Right 11/04/2014   Procedure: Thrombectomy;  Surgeon: Katha Cabal, MD;  Location: [place] [facility];  Service: Cardiovascular;  Laterality: Right;  . PERIPHERAL VASCULAR CATHETERIZATION Left 11/04/2014   Procedure: Visceral Venography;  Surgeon: Katha Cabal, MD;  Location: [place] [facility];  Service: Cardiovascular;  Laterality: Left;  . PERIPHERAL VASCULAR CATHETERIZATION Right 11/27/2014   Procedure: A/V Shuntogram/Fistulagram;  Surgeon: Algernon Huxley, MD;  Location: [place] [facility];  Service: Cardiovascular;  Laterality: Right;  . PERIPHERAL VASCULAR CATHETERIZATION Right 11/27/2014   Procedure: A/V Shunt Intervention;  Surgeon: Algernon Huxley, MD;  Location: [place];  Service: Cardiovascular;   Laterality: Right;  . PERIPHERAL VASCULAR CATHETERIZATION Right 12/31/2014   Procedure: Thrombectomy;  Surgeon: Algernon Huxley, MD;  Location: [place] [facility];  Service: Cardiovascular;  Laterality: Right;  . PERIPHERAL VASCULAR CATHETERIZATION Right 01/12/2015   Procedure: A/V Shuntogram/Fistulagram;  Surgeon: Algernon Huxley, MD;  Location: [place] [facility];  Service: Cardiovascular;  Laterality: Right;  . PERIPHERAL VASCULAR CATHETERIZATION N/A 01/12/2015   Procedure: A/V Shunt Intervention;  Surgeon: Algernon Huxley, MD;  Location: [place];  Service: Cardiovascular;  Laterality: N/A;  . PERIPHERAL VASCULAR CATHETERIZATION Right 01/28/2015   Procedure: Thrombectomy;  Surgeon: Algernon Huxley, MD;  Location: [place] [facility];  Service: Cardiovascular;  Laterality: Right;  . PERIPHERAL VASCULAR CATHETERIZATION N/A 06/08/2015   Procedure: A/V Shuntogram/Fistulagram;  Surgeon: Algernon Huxley, MD;  Location: [place] [facility];  Service: Cardiovascular;  Laterality: N/A;  . PERIPHERAL VASCULAR CATHETERIZATION N/A 06/08/2015   Procedure: A/V Shunt Intervention;  Surgeon: Algernon Huxley, MD;  Location: [place] [facility] [facility];  Service: Cardiovascular;  Laterality: N/A;  . PERIPHERAL VASCULAR CATHETERIZATION Right 07/06/2015   Procedure: A/V Shuntogram/Fistulagram;  Surgeon: Algernon Huxley, MD;  Location: [facility];  Service: Cardiovascular;  Laterality: Right;  . PERIPHERAL VASCULAR CATHETERIZATION N/A 07/06/2015   Procedure: A/V Shunt Intervention;  Surgeon: Algernon Huxley, MD;  Location: [place] [facility];  Service: Cardiovascular;  Laterality: N/A;  . PERIPHERAL VASCULAR CATHETERIZATION N/A 08/04/2015   Procedure: graft declot;  Surgeon: Katha Cabal, MD;  Location: [facility];  Service: Cardiovascular;  Laterality: N/A;  . PERIPHERAL VASCULAR CATHETERIZATION Right 08/25/2015   Procedure: A/V Shuntogram/Fistulagram;  Surgeon: Katha Cabal, MD;  Location: [place] [facility];  Service: Cardiovascular;  Laterality: Right;  . PERIPHERAL VASCULAR CATHETERIZATION N/A 11/04/2015   Procedure: Dialysis/Perma Catheter Insertion;  Surgeon: Algernon Huxley, MD;  Location: [place] [facility];  Service: Cardiovascular;  Laterality: N/A;  . PERIPHERAL VASCULAR CATHETERIZATION Left 12/14/2015   Procedure: A/V Shuntogram/Fistulagram;  Surgeon: Algernon Huxley, MD;  Location: [place] [facility];  Service: Cardiovascular;  Laterality: Left;  . PERIPHERAL VASCULAR CATHETERIZATION N/A 12/14/2015   Procedure: A/V Shunt Intervention;  Surgeon: Algernon Huxley, MD;  Location: [place] [facility];  Service: Cardiovascular;  Laterality: N/A;  . PERIPHERAL VASCULAR CATHETERIZATION N/A 12/17/2015   Procedure: Dialysis/Perma Catheter Insertion;  Surgeon: Algernon Huxley, MD;  Location: [place] [facility];  Service: Cardiovascular;  Laterality: N/A;  . PERIPHERAL VASCULAR CATHETERIZATION Left 12/22/2015   Procedure: Dialysis/Perma Catheter Insertion;  Surgeon: Katha Cabal, MD;  Location: [facility] [facility];  Service: Cardiovascular;  Laterality: Left;  . PERIPHERAL VASCULAR CATHETERIZATION Left 02/29/2016   Procedure: A/V Shuntogram/Fistulagram;  Surgeon: Algernon Huxley, MD;  Location: [facility] [facility];  Service: Cardiovascular;  Laterality: Left;  . PERIPHERAL VASCULAR CATHETERIZATION N/A 02/29/2016   Procedure: A/V Shunt Intervention;  Surgeon: Algernon Huxley, MD;  Location: [place] [facility];  Service: Cardiovascular;  Laterality: N/A;  . POCKET REVISION/RELOCATION N/A 01/01/2018   Procedure: POCKET REVISION/RELOCATION;  Surgeon: [place] Lance, MD;  Location: [facility];  Service: Cardiovascular;  Laterality: N/A;  . right arm graft     for dyalisis  . RIGHT/LEFT HEART CATH AND CORONARY ANGIOGRAPHY N/A 08/02/2017   Procedure: RIGHT/LEFT HEART CATH AND CORONARY ANGIOGRAPHY;  Surgeon: Larey Dresser, MD;  Location: [facility] [facility];  Service: Cardiovascular;  Laterality: N/A;  .  SUBQ ICD IMPLANT N/A 11/28/2017   Procedure: SUBQ ICD IMPLANT;  Surgeon: [place] Lance, MD;  Location: [place] [facility];  Service: Cardiovascular;  Laterality: N/A;  . TEE WITHOUT CARDIOVERSION N/A 07/31/2017   Procedure: TRANSESOPHAGEAL ECHOCARDIOGRAM (TEE);  Surgeon: Josue Hector, MD;  Location: [facility];  Service: Cardiovascular;  Laterality: N/A;  . TEE WITHOUT CARDIOVERSION N/A 09/01/2017   Procedure: TRANSESOPHAGEAL ECHOCARDIOGRAM (TEE);  Surgeon: [name], MD;  Location: [place];  Service: Open Heart Surgery;  Laterality: N/A;  . THROMBECTOMY    . THROMBECTOMY W/ EMBOLECTOMY  03/01/2011   Procedure: THROMBECTOMY ARTERIOVENOUS GORE-TEX GRAFT;  Surgeon: Elam Dutch, MD;  Location: [place];  Service: Vascular;  Laterality: Right;  Attempted Thrombectomy of Old  Right Upper Arm Arteriovenous gortex Graft. Insertion of new Arteriovenous Graft using 27mm x 50cm Gortex Stretch graft.   . THROMBECTOMY W/ EMBOLECTOMY  07/11/2011   Procedure: THROMBECTOMY ARTERIOVENOUS GORE-TEX GRAFT;  Surgeon: Rosetta Posner, MD;  Location: [place];  Service: Vascular;  Laterality: Right;  . UMBILICAL HERNIA REPAIR    . VENOGRAM N/A 08/23/2011   Procedure: VENOGRAM;  Surgeon: [name], MD;  Location: [facility];  Service: Cardiovascular;  Laterality: N/A;     Family History  Problem Relation Age of Onset  . Hypertension Mother   . Heart disease Mother   . Hypertension Father   . Heart disease Father      Social History   Socioeconomic History  . Marital status: Married    Spouse name: Not on file  . Number of children: Not on file  . Years of education: Not on file  . Highest education level: Not on file  Occupational History  . Occupation: disabled  Social Needs  . Financial resource strain: Not on file  . Food insecurity:    Worry: Not on file    Inability: Not on file  . Transportation needs:    Medical: Not on file    Non-medical: Not on file  Tobacco Use  . Smoking  status: Former Smoker    Types: Cigarettes    Last attempt to quit: 04/18/1994    Years since quitting: 23.7  . Smokeless tobacco: Current User    Types: Snuff  . Tobacco comment: dips 1/2 snuff per day times 25 years  Substance and Sexual Activity  . Alcohol use: No  . Drug use: No  . Sexual activity: Not on file  Lifestyle  . Physical activity:    Days per week: Not on file    Minutes per  session: Not on file  . Stress: Not on file  Relationships  . Social connections:    Talks on phone: Not on file    Gets together: Not on file    Attends religious service: Not on file    Active member of club or organization: Not on file    Attends meetings of clubs or organizations: Not on file    Relationship status: Not on file  . Intimate partner violence:    Fear of current or ex partner: Not on file    Emotionally abused: Not on file    Physically abused: Not on file    Forced sexual activity: Not on file  Other Topics Concern  . Not on file  Social History Narrative  . Not on file     BP 138/84   Pulse 75   Ht 6' (1.829 m)   Wt (!) 304 lb (137.9 kg)   SpO2 90%   BMI 41.23 kg/m   Physical Exam:  Morbidly obes appearing NAD HEENT: Unremarkable Neck:  7 cm JVD, no thyromegally Lymphatics:  No adenopathy Back:  No CVA tenderness Lungs:  Clear with no wheezes HEART:  Regular rate rhythm, no murmurs, no rubs, no clicks Abd:  soft, positive bowel sounds, no organomegally, no rebound, no guarding; his pocket has swollen again. Ext:  2 plus pulses, no edema, no cyanosis, no clubbing Skin:  No rashes no nodules Neuro:  CN II through XII intact, motor grossly intact  EKG - none  DEVICE  Not checked today  Assess/Plan: 1. ICD pocket hematoma - he is s/p evacuation. He will return next week to see how his pocket is doing. The combination of ESRD, chronic systemic anti-coagulation and his other comorbidities including massive obesity, markedly increase his risk of a pocket  infection.  2. CAD - he denies anginal symptom 3. Incisional pain - we will prescribe some Tylenol #3.   Mikle [email or web address].D.

## 2018-01-06 DIAGNOSIS — N186 End stage renal disease: Secondary | ICD-10-CM | POA: Diagnosis not present

## 2018-01-06 DIAGNOSIS — N2581 Secondary hyperparathyroidism of renal origin: Secondary | ICD-10-CM | POA: Diagnosis not present

## 2018-01-06 DIAGNOSIS — E611 Iron deficiency: Secondary | ICD-10-CM | POA: Diagnosis not present

## 2018-01-06 DIAGNOSIS — Z992 Dependence on renal dialysis: Secondary | ICD-10-CM | POA: Diagnosis not present

## 2018-01-09 ENCOUNTER — Ambulatory Visit (INDEPENDENT_AMBULATORY_CARE_PROVIDER_SITE_OTHER): Payer: Medicare Other | Admitting: *Deleted

## 2018-01-09 ENCOUNTER — Other Ambulatory Visit: Payer: Self-pay

## 2018-01-09 DIAGNOSIS — N186 End stage renal disease: Secondary | ICD-10-CM | POA: Diagnosis not present

## 2018-01-09 DIAGNOSIS — N2581 Secondary hyperparathyroidism of renal origin: Secondary | ICD-10-CM | POA: Diagnosis not present

## 2018-01-09 DIAGNOSIS — Z5181 Encounter for therapeutic drug level monitoring: Secondary | ICD-10-CM

## 2018-01-09 DIAGNOSIS — T82868D Thrombosis of vascular prosthetic devices, implants and grafts, subsequent encounter: Secondary | ICD-10-CM | POA: Diagnosis not present

## 2018-01-09 DIAGNOSIS — E611 Iron deficiency: Secondary | ICD-10-CM | POA: Diagnosis not present

## 2018-01-09 DIAGNOSIS — Z954 Presence of other heart-valve replacement: Secondary | ICD-10-CM | POA: Diagnosis not present

## 2018-01-09 DIAGNOSIS — T82837D Hemorrhage of cardiac prosthetic devices, implants and grafts, subsequent encounter: Secondary | ICD-10-CM

## 2018-01-09 DIAGNOSIS — Z992 Dependence on renal dialysis: Secondary | ICD-10-CM | POA: Diagnosis not present

## 2018-01-09 LAB — POCT INR: INR: 1.9 — AB (ref 2.0–3.0)

## 2018-01-09 MED ORDER — MIDODRINE HCL 5 MG PO TABS: 15.0000 mg | ORAL_TABLET | Freq: Three times a day (TID) | ORAL | 3 refills | Status: AC

## 2018-01-09 NOTE — Patient Instructions (Signed)
Description   Per Dr. Lovena Le continue taking Coumadin 5mg  daily. Recheck in 1 week with Pacer/Site Check.

## 2018-01-09 NOTE — Progress Notes (Signed)
Midodrine reordered per Dr. Lovena Le

## 2018-01-11 DIAGNOSIS — E611 Iron deficiency: Secondary | ICD-10-CM | POA: Diagnosis not present

## 2018-01-11 DIAGNOSIS — N2581 Secondary hyperparathyroidism of renal origin: Secondary | ICD-10-CM | POA: Diagnosis not present

## 2018-01-11 DIAGNOSIS — Z992 Dependence on renal dialysis: Secondary | ICD-10-CM | POA: Diagnosis not present

## 2018-01-11 DIAGNOSIS — N186 End stage renal disease: Secondary | ICD-10-CM | POA: Diagnosis not present

## 2018-01-13 ENCOUNTER — Other Ambulatory Visit: Payer: Self-pay

## 2018-01-13 ENCOUNTER — Emergency Department (HOSPITAL_COMMUNITY): Payer: Medicare Other

## 2018-01-13 ENCOUNTER — Telehealth: Payer: Self-pay | Admitting: Cardiology

## 2018-01-13 ENCOUNTER — Emergency Department (HOSPITAL_COMMUNITY)
Admission: EM | Admit: 2018-01-13 | Discharge: 2018-01-13 | Disposition: A | Discharge status: Home / Self Care | Payer: Medicare Other | Attending: Emergency Medicine | Admitting: Emergency Medicine

## 2018-01-13 DIAGNOSIS — Z5189 Encounter for other specified aftercare: Secondary | ICD-10-CM | POA: Insufficient documentation

## 2018-01-13 DIAGNOSIS — N186 End stage renal disease: Secondary | ICD-10-CM | POA: Insufficient documentation

## 2018-01-13 DIAGNOSIS — N2581 Secondary hyperparathyroidism of renal origin: Secondary | ICD-10-CM | POA: Diagnosis not present

## 2018-01-13 DIAGNOSIS — Z95810 Presence of automatic (implantable) cardiac defibrillator: Secondary | ICD-10-CM | POA: Diagnosis not present

## 2018-01-13 DIAGNOSIS — Z4800 Encounter for change or removal of nonsurgical wound dressing: Secondary | ICD-10-CM | POA: Diagnosis not present

## 2018-01-13 DIAGNOSIS — I509 Heart failure, unspecified: Secondary | ICD-10-CM | POA: Insufficient documentation

## 2018-01-13 DIAGNOSIS — Z87891 Personal history of nicotine dependence: Secondary | ICD-10-CM | POA: Diagnosis not present

## 2018-01-13 DIAGNOSIS — R918 Other nonspecific abnormal finding of lung field: Secondary | ICD-10-CM | POA: Diagnosis not present

## 2018-01-13 DIAGNOSIS — I120 Hypertensive chronic kidney disease with stage 5 chronic kidney disease or end stage renal disease: Secondary | ICD-10-CM | POA: Diagnosis not present

## 2018-01-13 DIAGNOSIS — I2510 Atherosclerotic heart disease of native coronary artery without angina pectoris: Secondary | ICD-10-CM | POA: Diagnosis not present

## 2018-01-13 DIAGNOSIS — K8020 Calculus of gallbladder without cholecystitis without obstruction: Secondary | ICD-10-CM | POA: Diagnosis not present

## 2018-01-13 DIAGNOSIS — I700 Atherosclerosis of aorta: Secondary | ICD-10-CM | POA: Diagnosis not present

## 2018-01-13 DIAGNOSIS — Z7982 Long term (current) use of aspirin: Secondary | ICD-10-CM | POA: Diagnosis not present

## 2018-01-13 DIAGNOSIS — Z992 Dependence on renal dialysis: Secondary | ICD-10-CM | POA: Insufficient documentation

## 2018-01-13 DIAGNOSIS — L7634 Postprocedural seroma of skin and subcutaneous tissue following other procedure: Secondary | ICD-10-CM | POA: Diagnosis not present

## 2018-01-13 DIAGNOSIS — E611 Iron deficiency: Secondary | ICD-10-CM | POA: Diagnosis not present

## 2018-01-13 DIAGNOSIS — Z79899 Other long term (current) drug therapy: Secondary | ICD-10-CM | POA: Diagnosis not present

## 2018-01-13 LAB — BASIC METABOLIC PANEL
ANION GAP: 10 (ref 5–15)
BUN: 22 mg/dL — ABNORMAL HIGH (ref 6–20)
CHLORIDE: 97 mmol/L — AB (ref 98–111)
CO2: 31 mmol/L (ref 22–32)
Calcium: 8.6 mg/dL — ABNORMAL LOW (ref 8.9–10.3)
Creatinine, Ser: 5.66 mg/dL — ABNORMAL HIGH (ref 0.61–1.24)
GFR calc Af Amer: 12 mL/min — ABNORMAL LOW (ref >60)
GFR calc non Af Amer: 10 mL/min — ABNORMAL LOW (ref >60)
GLUCOSE: 83 mg/dL (ref 70–99)
POTASSIUM: 3.7 mmol/L (ref 3.5–5.1)
Sodium: 138 mmol/L (ref 135–145)

## 2018-01-13 LAB — CBC WITH DIFFERENTIAL/PLATELET
Abs Immature Granulocytes: 0 10*3/uL (ref 0.0–0.1)
Basophils Absolute: 0 10*3/uL (ref 0.0–0.1)
Basophils Relative: 1 %
Eosinophils Absolute: 0.4 10*3/uL (ref 0.0–0.7)
Eosinophils Relative: 6 %
HCT: 38 % — ABNORMAL LOW (ref 39.0–52.0)
Hemoglobin: 11 g/dL — ABNORMAL LOW (ref 13.0–17.0)
Immature Granulocytes: 0 %
Lymphocytes Relative: 15 %
Lymphs Abs: 1.1 10*3/uL (ref 0.7–4.0)
MCH: 24.6 pg — ABNORMAL LOW (ref 26.0–34.0)
MCHC: 28.9 g/dL — ABNORMAL LOW (ref 30.0–36.0)
MCV: 84.8 fL (ref 78.0–100.0)
Monocytes Absolute: 0.8 10*3/uL (ref 0.1–1.0)
Monocytes Relative: 11 %
Neutro Abs: 4.9 10*3/uL (ref 1.7–7.7)
Neutrophils Relative %: 67 %
Platelets: 264 10*3/uL (ref 150–400)
RBC: 4.48 MIL/uL (ref 4.22–5.81)
RDW: 16.1 % — ABNORMAL HIGH (ref 11.5–15.5)
WBC: 7.2 10*3/uL (ref 4.0–10.5)

## 2018-01-13 LAB — PROTIME-INR
INR: 2
Prothrombin Time: 22.5 seconds — ABNORMAL HIGH (ref 11.4–15.2)

## 2018-01-13 LAB — SEDIMENTATION RATE: Sed Rate: 65 mm/hr — ABNORMAL HIGH (ref 0–16)

## 2018-01-13 LAB — C-REACTIVE PROTEIN: CRP: 6.7 mg/dL — ABNORMAL HIGH (ref <1.0)

## 2018-01-13 NOTE — ED Notes (Signed)
PT states understanding of care given, follow up care. PT ambulated from ED to car with a steady gait.  

## 2018-01-13 NOTE — ED Triage Notes (Signed)
Patient had a defibrillator placed one month ago, left lateral breast area. Then it began swelling two weeks ago and had it drained. Suppose to follow up with surgeon on Monday, but wound is leaking serosanguinous fluid.

## 2018-01-13 NOTE — ED Notes (Signed)
Pt has a little open area that is having serosanguinous drainage at this time.

## 2018-01-13 NOTE — Consult Note (Signed)
Cardiology Consult    Patient ID: [name] [name] MRN: [id number], DOB/AGE: March 14, 1966   Admit date: 01/13/2018 Date of Consult: 01/13/2018  Primary Physician: System, Provider Not In Primary Cardiologist: Kate Sable, MD Requesting Provider: Dr. Criss Rosales  Patient Profile    [name] [name] is a 52 y.o. male with a history of ESRD on HD. He has a longstanding ICM, s/p CABG, chronic systolic heart failure who underwent SICD 11/28/17, who is being seen today for the evaluation of pocket site drainage at the request of Dr. Criss Rosales.  Past Medical History   Past Medical History:  Diagnosis Date  . Aneurysm ([place])     Right arm fistula 3 aneurysms 2011,   plans to have a new procedure  . Angina   . Chest discomfort   . CHF (congestive heart failure) ([place])   . Coronary artery disease   . Coronary artery disease involving native coronary artery of native heart without angina pectoris   . Dialysis patient ([place])   . Dysrhythmia   . ESRD (end stage renal disease) ([place])    on hemodialysis T_T_S  . Hypertension   . Leg pain   . Mitral regurgitation   . Morbid obesity ([place])   . Orthostatic hypotension   . Overweight(278.02)   . Renal insufficiency   . 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 Severe aortic insufficiency   . Sinus tachycardia   . Syncope     positional after dialysis... 2008    Past Surgical History:  Procedure Laterality Date  . A/V FISTULAGRAM Left 06/30/2016   Procedure: A/V Fistulagram;  Surgeon: Algernon Huxley, MD;  Location: [place] [facility];  Service: Cardiovascular;  Laterality: Left;  . AORTIC VALVE REPLACEMENT N/A 09/01/2017   Procedure: AORTIC VALVE REPLACEMENT (AVR);  Surgeon: [name], MD;  Location: [place];  Service: Open Heart Surgery;  Laterality: N/A;  . ARTERIOVENOUS GRAFT PLACEMENT    . AV FISTULA PLACEMENT    .  AV FISTULA PLACEMENT Left 11/18/2015   Procedure: ARTERIOVENOUS (AV) FISTULA CREATION ( RADIOCEPHALIC );  Surgeon: Algernon Huxley, MD;  Location: [facility];  Service: Vascular;  Laterality: Left;  . AV FISTULA PLACEMENT Left 03/23/2016   Procedure: ARTERIOVENOUS (AV) FISTULA CREATION ( REVISION );  Surgeon: Algernon Huxley, MD;  Location: [facility];  Service: Vascular;  Laterality: Left;  . [place] REMOVAL Left 03/23/2016   Procedure: REMOVAL OF ARTERIOVENOUS GORETEX GRAFT ([place]);  Surgeon: Algernon Huxley, MD;  Location: [facility];  Service: Vascular;  Laterality: Left;  . CENTRAL LINE INSERTION Right 09/01/2017   Procedure: FLOROSCOPY GUIDED PLACEMENT OF RIGHT FEMORAL CENTRAL LINE TIMES TWO  AND PLACEMENT OF SWAN GANZ CATHETER;  Surgeon: [name], MD;  Location: [place];  Service: Open Heart Surgery;  Laterality: Right;  . CORONARY ARTERY BYPASS GRAFT N/A 09/01/2017   Procedure: CORONARY ARTERY BYPASS GRAFTING (CABG) X 1, USING RIGHT GREATER SAPHENOUS VEIN HARVESTED ENDOSCOPICALLY;  Surgeon: [name], MD;  Location: [place];  Service: Open Heart Surgery;  Laterality: N/A;  . DIALYSIS/PERMA CATHETER REMOVAL N/A 08/01/2016   Procedure: Dialysis/Perma Catheter Removal;  Surgeon: Algernon Huxley, MD;  Location: [facility] [facility];  Service: Cardiovascular;  Laterality: N/A;  . HERNIA REPAIR    . INSERTION OF DIALYSIS CATHETER  03/01/2011   Procedure:  INSERTION OF DIALYSIS CATHETER;  Surgeon: Elam Dutch, MD;  Location: [place];  Service: Vascular;  Laterality: Left;  Exchange of Dialysis Catheter to 27cm 15Fr. Arrow Catheter  . INSERTION OF DIALYSIS CATHETER  09/01/2017   Procedure: PLACEMENT OF TRIALYSIS SHORT TERM DIALYSIS CATHETER;  Surgeon: [name], MD;  Location: [place] [facility];  Service: Open Heart Surgery;;  . MULTIPLE EXTRACTIONS WITH ALVEOLOPLASTY N/A 08/07/2017   Procedure: Extraction of tooth #2, 7,8,13,14,15,17, and 31 with alveoloplasty and gross debridement of remaining teeth;  Surgeon: [name], DDS;  Location: [place];  Service: Oral Surgery;  Laterality: N/A;  . PERIPHERAL VASCULAR CATHETERIZATION N/A 09/01/2014   Procedure: A/V Shuntogram/Fistulagram;  Surgeon: Algernon Huxley, MD;  Location: [place] [facility];  Service: Cardiovascular;  Laterality: N/A;  . PERIPHERAL VASCULAR CATHETERIZATION Right 09/01/2014   Procedure: Thrombectomy;  Surgeon: Algernon Huxley, MD;  Location: [place] [facility];  Service: Cardiovascular;  Laterality: Right;  . PERIPHERAL VASCULAR CATHETERIZATION Right 09/01/2014   Procedure: A/V Shunt Intervention;  Surgeon: Algernon Huxley, MD;  Location: [place] [facility];  Service: Cardiovascular;  Laterality: Right;  . PERIPHERAL VASCULAR CATHETERIZATION N/A 09/17/2014   Procedure: A/V Shuntogram/Fistulagram;  Surgeon: Algernon Huxley, MD;  Location: [place] [facility];  Service: Cardiovascular;  Laterality: N/A;  . PERIPHERAL VASCULAR CATHETERIZATION Right 10/17/2014   Procedure: Thrombectomy;  Surgeon: Katha Cabal, MD;  Location: [place] [facility];  Service: Cardiovascular;  Laterality: Right;  . PERIPHERAL VASCULAR CATHETERIZATION N/A 10/17/2014   Procedure: A/V Shuntogram/Fistulagram;  Surgeon: Katha Cabal, MD;  Location: [place] [facility];  Service: Cardiovascular;  Laterality: N/A;  . PERIPHERAL VASCULAR CATHETERIZATION N/A 10/17/2014   Procedure: A/V Shunt Intervention;  Surgeon: Katha Cabal, MD;  Location: [place] [facility];  Service: Cardiovascular;  Laterality: N/A;  . PERIPHERAL VASCULAR CATHETERIZATION Right 11/04/2014   Procedure: Thrombectomy;  Surgeon: Katha Cabal, MD;  Location: [place] [facility];  Service: Cardiovascular;  Laterality: Right;  . PERIPHERAL VASCULAR CATHETERIZATION Left 11/04/2014   Procedure: Visceral Venography;  Surgeon: Katha Cabal, MD;  Location: [place] [facility];  Service: Cardiovascular;  Laterality: Left;  . PERIPHERAL VASCULAR CATHETERIZATION Right 11/27/2014   Procedure: A/V  Shuntogram/Fistulagram;  Surgeon: Algernon Huxley, MD;  Location: [place] [facility];  Service: Cardiovascular;  Laterality: Right;  . PERIPHERAL VASCULAR CATHETERIZATION Right 11/27/2014   Procedure: A/V Shunt Intervention;  Surgeon: Algernon Huxley, MD;  Location: [place] [facility];  Service: Cardiovascular;  Laterality: Right;  . PERIPHERAL VASCULAR CATHETERIZATION Right 12/31/2014   Procedure: Thrombectomy;  Surgeon: Algernon Huxley, MD;  Location: [place] [facility];  Service: Cardiovascular;  Laterality: Right;  . PERIPHERAL VASCULAR CATHETERIZATION Right 01/12/2015   Procedure: A/V Shuntogram/Fistulagram;  Surgeon: Algernon Huxley, MD;  Location: [place] [facility];  Service: Cardiovascular;  Laterality: Right;  . PERIPHERAL VASCULAR CATHETERIZATION N/A 01/12/2015   Procedure: A/V Shunt Intervention;  Surgeon: Algernon Huxley, MD;  Location: [place] [facility];  Service: Cardiovascular;  Laterality: N/A;  . PERIPHERAL VASCULAR CATHETERIZATION Right 01/28/2015   Procedure: Thrombectomy;  Surgeon: Algernon Huxley, MD;  Location: [place] [facility];  Service: Cardiovascular;  Laterality: Right;  . PERIPHERAL VASCULAR CATHETERIZATION N/A 06/08/2015   Procedure: A/V Shuntogram/Fistulagram;  Surgeon: Algernon Huxley, MD;  Location: [place] [facility];  Service: Cardiovascular;  Laterality: N/A;  . PERIPHERAL VASCULAR CATHETERIZATION N/A 06/08/2015   Procedure: A/V Shunt Intervention;  Surgeon: Algernon Huxley, MD;  Location: [place] [facility];  Service: Cardiovascular;  Laterality: N/A;  . PERIPHERAL VASCULAR CATHETERIZATION Right 07/06/2015   Procedure: A/V Shuntogram/Fistulagram;  Surgeon: Algernon Huxley, MD;  Location: [place] [facility];  Service: Cardiovascular;  Laterality: Right;  . PERIPHERAL VASCULAR CATHETERIZATION N/A 07/06/2015   Procedure: A/V Shunt Intervention;  Surgeon: Algernon Huxley, MD;  Location: [place] [facility];  Service: Cardiovascular;  Laterality: N/A;  . PERIPHERAL VASCULAR CATHETERIZATION  N/A 08/04/2015   Procedure: graft declot;  Surgeon: Katha Cabal, MD;  Location: [place] [facility];  Service: Cardiovascular;  Laterality: N/A;  . PERIPHERAL VASCULAR CATHETERIZATION Right 08/25/2015   Procedure: A/V Shuntogram/Fistulagram;  Surgeon: Katha Cabal, MD;  Location: [place] [facility];  Service: Cardiovascular;  Laterality: Right;  . PERIPHERAL VASCULAR CATHETERIZATION N/A 11/04/2015   Procedure: Dialysis/Perma Catheter Insertion;  Surgeon: Algernon Huxley, MD;  Location: [place] [facility];  Service: Cardiovascular;  Laterality: N/A;  . PERIPHERAL VASCULAR CATHETERIZATION Left 12/14/2015   Procedure: A/V Shuntogram/Fistulagram;  Surgeon: Algernon Huxley, MD;  Location: [place] [facility];  Service: Cardiovascular;  Laterality: Left;  . PERIPHERAL VASCULAR CATHETERIZATION N/A 12/14/2015   Procedure: A/V Shunt Intervention;  Surgeon: Algernon Huxley, MD;  Location: [place] [facility];  Service: Cardiovascular;  Laterality: N/A;  . PERIPHERAL VASCULAR CATHETERIZATION N/A 12/17/2015   Procedure: Dialysis/Perma Catheter Insertion;  Surgeon: Algernon Huxley, MD;  Location: [place] [facility];  Service: Cardiovascular;  Laterality: N/A;  . PERIPHERAL VASCULAR CATHETERIZATION Left 12/22/2015   Procedure: Dialysis/Perma Catheter Insertion;  Surgeon: Katha Cabal, MD;  Location: [place] [facility];  Service: Cardiovascular;  Laterality: Left;  . PERIPHERAL VASCULAR CATHETERIZATION Left 02/29/2016   Procedure: A/V Shuntogram/Fistulagram;  Surgeon: Algernon Huxley, MD;  Location: [place] [facility];  Service: Cardiovascular;  Laterality: Left;  . PERIPHERAL VASCULAR CATHETERIZATION N/A 02/29/2016   Procedure: A/V Shunt Intervention;  Surgeon: Algernon Huxley, MD;  Location: [place] [facility];  Service: Cardiovascular;  Laterality: N/A;  . POCKET REVISION/RELOCATION N/A 01/01/2018   Procedure: POCKET REVISION/RELOCATION;  Surgeon: Evans Lance, MD;  Location: [place] [facility];  Service:  Cardiovascular;  Laterality: N/A;  . right arm graft     for dyalisis  . RIGHT/LEFT HEART CATH AND CORONARY ANGIOGRAPHY N/A 08/02/2017   Procedure: RIGHT/LEFT HEART CATH AND CORONARY ANGIOGRAPHY;  Surgeon: Larey Dresser, MD;  Location: [place] [facility];  Service: Cardiovascular;  Laterality: N/A;  . SUBQ ICD IMPLANT N/A 11/28/2017   Procedure: SUBQ ICD IMPLANT;  Surgeon: Evans Lance, MD;  Location: [place] [facility];  Service: Cardiovascular;  Laterality: N/A;  . TEE WITHOUT CARDIOVERSION N/A 07/31/2017   Procedure: TRANSESOPHAGEAL ECHOCARDIOGRAM (TEE);  Surgeon: Josue Hector, MD;  Location: [facility];  Service: Cardiovascular;  Laterality: N/A;  . TEE WITHOUT CARDIOVERSION N/A 09/01/2017   Procedure: TRANSESOPHAGEAL ECHOCARDIOGRAM (TEE);  Surgeon: [name], MD;  Location: [place];  Service: Open Heart Surgery;  Laterality: N/A;  . THROMBECTOMY    . THROMBECTOMY W/ EMBOLECTOMY  03/01/2011   Procedure: THROMBECTOMY ARTERIOVENOUS GORE-TEX GRAFT;  Surgeon: Elam Dutch, MD;  Location: [place];  Service: Vascular;  Laterality: Right;  Attempted Thrombectomy of Old  Right Upper Arm Arteriovenous gortex Graft. Insertion of new Arteriovenous Graft using 89mm x 50cm Gortex Stretch graft.   . THROMBECTOMY W/ EMBOLECTOMY  07/11/2011   Procedure: THROMBECTOMY ARTERIOVENOUS GORE-TEX GRAFT;  Surgeon: Sherren Mocha  Katina Dung, MD;  Location: [facility];  Service: Vascular;  Laterality: Right;  . UMBILICAL HERNIA REPAIR    . VENOGRAM N/A 08/23/2011   Procedure: VENOGRAM;  Surgeon: [name], MD;  Location: [facility];  Service: Cardiovascular;  Laterality: N/A;     Allergies  No Known Allergies  History of Present Illness    [name] [name] is a 53 y.o. male with a history of ESRD on HD. He has a longstanding ICM, s/p CABG, chronic systolic heart failure who underwent SICD 11/28/17, who is being seen today for the evaluation of pocket site drainage at the request of Dr. Criss Rosales.  The patient has had  complicated course following ICD placement. ICD was placed on 11/28/17. He did well initially but developed large (size of cantelope) pocket hematoma which was evacuated on 9/16. He was restarted on his home warfarin for history of mechanical AVR and was last seen in clinic for recheck on 9/20 at which time his pocket showed some swelling but improved from prior. The patient reports that his incision remained open and that he continued to have some serosanguinous drainage but that today he had large amounts of drainage after dialysis. Was using a beach towel to soak up fluid that was initially serosanguinous but transitioned to frank blood per their report. He presented to OSH ED where the patient said that labs, CT of the chest were completed, although these are unfortunately not available to me at this time. From what the patient describes, he was going to be discharged so presented to [facility], where he receives his cardiovascular care. No fever, chills or other systemic symptoms. Otherwise is feeling well.   Outpatient Medications    No current facility-administered medications on file prior to encounter.    Current Outpatient Medications on File Prior to Encounter  Medication Sig Dispense Refill  . acetaminophen-codeine (TYLENOL #3) 300-30 MG tablet Take 1-2 tablets by mouth every 6 (six) hours as needed for moderate pain. 20 tablet 0  . amiodarone (PACERONE) 200 MG tablet Take 200 mg by mouth daily.    [name] aspirin EC 81 MG EC tablet Take 1 tablet (81 mg total) by mouth daily.    [name] atorvastatin (LIPITOR) 80 MG tablet Take 1 tablet (80 mg total) by mouth daily at 6 PM. 90 tablet 3  . B Complex-C-Folic Acid (NEPHRO-VITE PO) Take 1 tablet by mouth daily.     . chlorhexidine (PERIDEX) 0.12 % solution Use as directed 15 mLs in the mouth or throat 3 (three) times daily as needed.     [name] FOSRENOL 500 MG chewable tablet Chew 1,500-2,000 mg by mouth See admin instructions. Take 2000 mg by mouth with meals  and take 1500 mg by mouth with snacks    . metoprolol tartrate (LOPRESSOR) 25 MG tablet Take 1 tablet (25 mg total) by mouth 2 (two) times daily. 60 tablet 3  . midodrine (PROAMATINE) 5 MG tablet Take 3 tablets (15 mg total) by mouth 3 (three) times daily with meals. 810 tablet 3  . oxyCODONE (OXY IR/ROXICODONE) 5 MG immediate release tablet Take 1 tablet (5 mg total) by mouth every 4 (four) hours as needed for severe pain. 30 tablet 0  . warfarin (COUMADIN) 5 MG tablet Take 1 tablet daily except 1/2 tablet on Tuesdays, Thursdays and Saturdays or as directed by coumadin clinic (Patient taking differently: Take 5 mg by mouth daily. ) 90 tablet 1    Family History    Family  History  Problem Relation Age of Onset  . Hypertension Mother   . Heart disease Mother   . Hypertension Father   . Heart disease Father    He indicated that his mother is deceased. He indicated that his father is deceased.   Social History    Social History   Socioeconomic History  . Marital status: Married    Spouse name: Not on file  . Number of children: Not on file  . Years of education: Not on file  . Highest education level: Not on file  Occupational History  . Occupation: disabled  Social Needs  . Financial resource strain: Not on file  . Food insecurity:    Worry: Not on file    Inability: Not on file  . Transportation needs:    Medical: Not on file    Non-medical: Not on file  Tobacco Use  . Smoking status: Former Smoker    Types: Cigarettes    Last attempt to quit: 04/18/1994    Years since quitting: 23.7  . Smokeless tobacco: Current User    Types: Snuff  . Tobacco comment: dips 1/2 snuff per day times 25 years  Substance and Sexual Activity  . Alcohol use: No  . Drug use: No  . Sexual activity: Not on file  Lifestyle  . Physical activity:    Days per week: Not on file    Minutes per session: Not on file  . Stress: Not on file  Relationships  . Social connections:    Talks on phone:  Not on file    Gets together: Not on file    Attends religious service: Not on file    Active member of club or organization: Not on file    Attends meetings of clubs or organizations: Not on file    Relationship status: Not on file  . Intimate partner violence:    Fear of current or ex partner: Not on file    Emotionally abused: Not on file    Physically abused: Not on file    Forced sexual activity: Not on file  Other Topics Concern  . Not on file  Social History Narrative  . Not on file     Review of Systems    General:  No chills, fever, night sweats or weight changes.  Cardiovascular:  No chest pain, dyspnea on exertion, edema, orthopnea, palpitations, paroxysmal nocturnal dyspnea. Dermatological: No rash, lesions/masses Respiratory: No cough, dyspnea Urologic: No hematuria, dysuria Abdominal:   No nausea, vomiting, diarrhea, bright red blood per rectum, melena, or hematemesis Neurologic:  No visual changes, wkns, changes in mental status. All other systems reviewed and are otherwise negative except as noted above.  Physical Exam    Blood pressure 130/84, pulse 88, temperature 98.4 F (36.9 C), temperature source Oral, resp. rate 16, height 6' (1.829 m), weight (!) 138.3 kg, SpO2 99 %.  General: Pleasant, NAD Psych: Normal affect. Neuro: Alert and oriented X 3. Moves all extremities spontaneously. HEENT: Normal  Neck: Supple without bruits or JVD. Lungs:  Resp regular and unlabored, CTA. Heart: RRR no s3, s4, soft systolic murmur. Mechanical S2.  Chest: L lower chest ICD site with swelling and nodular areas on palpation, approximately one cm opening in incision which is draining serosanguinous fluid. Expression of pocket site does not cause significant additional drainage. No pus or frank bleeding noted. Mild warmth overlying site. Abdomen: Soft, non-tender, non-distended, BS + x 4.  Extremities: No clubbing, cyanosis or edema. DP/PT/Radials 2+  and equal  bilaterally.  Labs    Troponin (Point of Care Test) No results for input(s): TROPIPOC in the last 72 hours. No results for input(s): CKTOTAL, CKMB, TROPONINI in the last 72 hours. Lab Results  Component Value Date   WBC 7.2 01/13/2018   HGB 11.0 (L) 01/13/2018   HCT 38.0 (L) 01/13/2018   MCV 84.8 01/13/2018   PLT 264 01/13/2018    Recent Labs  Lab 01/13/18 2141  NA 138  K 3.7  CL 97*  CO2 31  BUN 22*  CREATININE 5.66*  CALCIUM 8.6*  GLUCOSE 83   Lab Results  Component Value Date   CHOL 180 10/25/2017   HDL 44 10/25/2017   LDLCALC 118 (H) 10/25/2017   TRIG 89 10/25/2017   Lab Results  Component Value Date   DDIMER (H) 06/07/2010    2.32        AT THE INHOUSE ESTABLISHED CUTOFF VALUE OF 0.48 ug/mL FEU, THIS ASSAY HAS BEEN DOCUMENTED IN THE LITERATURE TO HAVE A SENSITIVITY AND NEGATIVE PREDICTIVE VALUE OF AT LEAST 98 TO 99%.  THE TEST RESULT SHOULD BE CORRELATED WITH AN ASSESSMENT OF THE CLINICAL PROBABILITY OF DVT / VTE.     Radiology Studies    Dg Chest 2 View  Result Date: 01/13/2018 CLINICAL DATA:  Status post defibrillator placement, swelling/drainage EXAM: CHEST - 2 VIEW COMPARISON:  [facility] CT chest dated 01/13/2018 FINDINGS: Lungs are clear.  No pleural effusion or pneumothorax. The heart is normal in size.  Prosthetic valve. Single lead left chest ICD overlying the sternum. Associated gas superior to the pacemaker device in the left chest wall, corresponding to a 8.1 x 10.9 cm fluid/gas collection on CT. Median sternotomy. Surgical clips overlying the left neck. IMPRESSION: Soft tissue gas superior to the pacemaker device in the left chest wall, corresponding to 8.1 x 10.9 cm fluid/gas collection on CT. Electronically Signed   By: Julian Hy M.D.   On: 01/13/2018 22:21    Assessment & Plan    [name] [name] is a 52 y.o. male with a history of ESRD on HD. He has a longstanding ICM, s/p CABG, chronic systolic heart failure who underwent SICD  11/28/17, who is being seen today for the evaluation of pocket site drainage. Unfortunately has ongoing swelling of the pocket site - as I am just meeting him today, it is unclear how much of the swelling is new versus residual. Patient believes it may be worsening but is much less significant than prior to previous pocket evacuation. Ongoing drainage is concerning for recurrent hematoma and he is at high risk for infection, although no systemic symptoms at this time and WBC within normal limits.  Discussed with Dr. Rayann Heman, on call for EP and given lack of systemic symptoms as well as already scheduled appointment on Monday, will plan for patient to be discharged from the ED and keep Monday appointment. No change in medications at this time, INR just therapeutic at 2.0. A pressure dressing was applied to the site prior to discharge. Patient was instructed to return if he develops fever, chills, pus drainage. He and his wife noted understanding.  Signed, Bryna Colander, MD 01/13/2018, 11:06 PM  For questions or updates, please contact   Please consult [email or web address] for contact info under Cardiology/STEMI.

## 2018-01-13 NOTE — Discharge Instructions (Signed)
Please go see your cardiologist at your appt on Monday.  After your exam and labs we believe you are safe to go home until then.  If you develop new chest pain or significant fever or bleeding, come back and we can re-evaluate you.

## 2018-01-13 NOTE — Telephone Encounter (Signed)
Received call from patient's wife stating that he started to have fluid oozing from his ICD incision site again. Reports that it was initially just clear fluid but progressed to significant bleeding. The site was not swollen as it was previously with large hematoma that was evacuated earlier this month. Due to the amount of bleeding, however, the wife reports that they were already on their way to [facility] for evaluation. Discussed that should he need admission, the family would like him transferred to [facility] and noted that we would be happy to take care of him here. She will let the ED physician at [facility] know of their desire to be transferred, if needed.  Bryna Colander, MD

## 2018-01-13 NOTE — ED Provider Notes (Signed)
[facility] EMERGENCY DEPARTMENT Provider Note   CSN: [id number] Arrival date & time: 01/13/18  1927     History   Chief Complaint Chief Complaint  Patient presents with  . Wound Check    HPI [name] [name] is a 52 y.o. male.  HPI  Patient with hx of ICD placement one month ago presents for wound check.  His course has been complicated by large hematoma evacuation 9/16 by Dr. Lovena Le.  He had outpatient f/u from that evacuation on 9/20 and still had some drainage.  He was scheduled for a 2nd outpatient wound check on Monday but presents to the ED today because he had a large painless serosanguinous drainage today that soaked his sheets and multiple towels at home.  His family says they went to an ED in [place] and had some labs drawn but claim their cardiologist told them to come to [facility] so that one of their group could evaluate him.  Patient did not expect problems today and the sudden leaking caught him off guard, he has had no fevers, lightheadedness, chest pain, SOB, no erythema/warmth at the wound site.  He says other than the significant drainage, he feels well.  Past Medical History:  Diagnosis Date  . Aneurysm ([place])     Right arm fistula 3 aneurysms 2011,   plans to have a new procedure  . Angina   . Chest discomfort   . CHF (congestive heart failure) ([place])   . Coronary artery disease   . Coronary artery disease involving native coronary artery of native heart without angina pectoris   . Dialysis patient ([place])   . Dysrhythmia   . ESRD (end stage renal disease) ([place])    on hemodialysis [id number]  . Hypertension   . Leg pain   . Mitral regurgitation   . Morbid obesity ([place])   . Orthostatic hypotension   . Overweight(278.02)   . Renal insufficiency   . 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 Severe aortic  insufficiency   . Sinus tachycardia   . Syncope     positional after dialysis... 2008    Patient Active Problem List   Diagnosis Date Noted  . ICD (implantable cardioverter-defibrillator) pocket hematoma 01/01/2018  . Encounter for therapeutic drug monitoring 09/14/2017  . S/P aortic valve replacement with bileaflet mechanical valve 09/01/2017  . S/P CABG x 1 09/01/2017  . Coronary artery disease involving native coronary artery of native heart without angina pectoris   . 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 Abnormal echocardiogram   . Mitral regurgitation   . Severe aortic insufficiency   . Acute on chronic combined systolic and diastolic heart failure ([place])   . Systolic CHF ([place]) 58/12/9831  . 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 Dermatitis 04/06/2016  . End stage renal disease ([place]) 12/22/2011  . Sinus tachycardia   . ESRD (end stage renal disease) ([place])   . Orthostatic hypotension   . Chest discomfort   . Morbid obesity ([place])   . Syncope   . Aneurysm [facility])     Past Surgical History:  Procedure Laterality Date  . A/V FISTULAGRAM Left 06/30/2016   Procedure: A/V Fistulagram;  Surgeon: Algernon Huxley, MD;  Location: [place] CV LAB;  Service: Cardiovascular;  Laterality: Left;  . AORTIC VALVE REPLACEMENT N/A 09/01/2017  Procedure: AORTIC VALVE REPLACEMENT (AVR);  Surgeon: [name], MD;  Location: [place];  Service: Open Heart Surgery;  Laterality: N/A;  . ARTERIOVENOUS GRAFT PLACEMENT    . AV FISTULA PLACEMENT    . AV FISTULA PLACEMENT Left 11/18/2015   Procedure: ARTERIOVENOUS (AV) FISTULA CREATION ( RADIOCEPHALIC );  Surgeon: Algernon Huxley, MD;  Location: [facility] ORS;  Service: Vascular;  Laterality: Left;  . AV FISTULA PLACEMENT Left 03/23/2016   Procedure: ARTERIOVENOUS (AV) FISTULA CREATION ( REVISION );  Surgeon: Algernon Huxley, MD;  Location: [facility] OR[facility];  Service: Vascular;  Laterality: Left;  . [place] REMOVAL Left 03/23/2016   Procedure: REMOVAL OF  ARTERIOVENOUS GORETEX GRAFT ([place]);  Surgeon: Algernon Huxley, MD;  Location: [facility] OR[facility];  Service: Vascular;  Laterality: Left;  . CENTRAL LINE INSERTION Right 09/01/2017   Procedure: FLOROSCOPY GUIDED PLACEMENT OF RIGHT FEMORAL CENTRAL LINE TIMES TWO  AND PLACEMENT OF SWAN GANZ CATHETER;  Surgeon: [name], MD;  Location: [place];  Service: Open Heart Surgery;  Laterality: Right;  . CORONARY ARTERY BYPASS GRAFT N/A 09/01/2017   Procedure: CORONARY ARTERY BYPASS GRAFTING (CABG) X 1, USING RIGHT GREATER SAPHENOUS VEIN HARVESTED ENDOSCOPICALLY;  Surgeon: [name], MD;  Location: [place];  Service: Open Heart Surgery;  Laterality: N/A;  . DIALYSIS/PERMA CATHETER REMOVAL N/A 08/01/2016   Procedure: Dialysis/Perma Catheter Removal;  Surgeon: Algernon Huxley, MD;  Location: [place] CV LAB;  Service: Cardiovascular;  Laterality: N/A;  . HERNIA REPAIR    . INSERTION OF DIALYSIS CATHETER  03/01/2011   Procedure: INSERTION OF DIALYSIS CATHETER;  Surgeon: Elam Dutch, MD;  Location: [place];  Service: Vascular;  Laterality: Left;  Exchange of Dialysis Catheter to 27cm 15Fr. Arrow Catheter  . INSERTION OF DIALYSIS CATHETER  09/01/2017   Procedure: PLACEMENT OF TRIALYSIS SHORT TERM DIALYSIS CATHETER;  Surgeon: [name], MD;  Location: [place] OR;  Service: Open Heart Surgery;;  . MULTIPLE EXTRACTIONS WITH ALVEOLOPLASTY N/A 08/07/2017   Procedure: Extraction of tooth #2, 7,8,13,14,15,17, and 31 with alveoloplasty and gross debridement of remaining teeth;  Surgeon: [name], DDS;  Location: [facility];  Service: Oral Surgery;  Laterality: N/A;  . PERIPHERAL VASCULAR CATHETERIZATION N/A 09/01/2014   Procedure: A/V Shuntogram/Fistulagram;  Surgeon: Algernon Huxley, MD;  Location: [place] CV LAB;  Service: Cardiovascular;  Laterality: N/A;  . PERIPHERAL VASCULAR CATHETERIZATION Right 09/01/2014   Procedure: Thrombectomy;  Surgeon: Algernon Huxley, MD;  Location: [place] CV LAB;  Service:  Cardiovascular;  Laterality: Right;  . PERIPHERAL VASCULAR CATHETERIZATION Right 09/01/2014   Procedure: A/V Shunt Intervention;  Surgeon: Algernon Huxley, MD;  Location: [place] CV LAB;  Service: Cardiovascular;  Laterality: Right;  . PERIPHERAL VASCULAR CATHETERIZATION N/A 09/17/2014   Procedure: A/V Shuntogram/Fistulagram;  Surgeon: Algernon Huxley, MD;  Location: [place] CV LAB;  Service: Cardiovascular;  Laterality: N/A;  . PERIPHERAL VASCULAR CATHETERIZATION Right 10/17/2014   Procedure: Thrombectomy;  Surgeon: Katha Cabal, MD;  Location: [place] CV LAB;  Service: Cardiovascular;  Laterality: Right;  . PERIPHERAL VASCULAR CATHETERIZATION N/A 10/17/2014   Procedure: A/V Shuntogram/Fistulagram;  Surgeon: Katha Cabal, MD;  Location: [place] [facility] LAB;  Service: Cardiovascular;  Laterality: N/A;  . PERIPHERAL VASCULAR CATHETERIZATION N/A 10/17/2014   Procedure: A/V Shunt Intervention;  Surgeon: Katha Cabal, MD;  Location: [place] CV LAB;  Service: Cardiovascular;  Laterality: N/A;  . PERIPHERAL VASCULAR CATHETERIZATION Right 11/04/2014   Procedure: Thrombectomy;  Surgeon: Katha Cabal, MD;  Location: [place] [facility];  Service: Cardiovascular;  Laterality: Right;  . PERIPHERAL VASCULAR CATHETERIZATION Left 11/04/2014   Procedure: Visceral Venography;  Surgeon: Katha Cabal, MD;  Location: [place] CV LAB;  Service: Cardiovascular;  Laterality: Left;  . PERIPHERAL VASCULAR CATHETERIZATION Right 11/27/2014   Procedure: A/V Shuntogram/Fistulagram;  Surgeon: Algernon Huxley, MD;  Location: [place] [facility] LAB;  Service: Cardiovascular;  Laterality: Right;  . PERIPHERAL VASCULAR CATHETERIZATION Right 11/27/2014   Procedure: A/V Shunt Intervention;  Surgeon: Algernon Huxley, MD;  Location: [place] CV LAB;  Service: Cardiovascular;  Laterality: Right;  . PERIPHERAL VASCULAR CATHETERIZATION Right 12/31/2014   Procedure: Thrombectomy;  Surgeon: Algernon Huxley, MD;   Location: [place] CV LAB;  Service: Cardiovascular;  Laterality: Right;  . PERIPHERAL VASCULAR CATHETERIZATION Right 01/12/2015   Procedure: A/V Shuntogram/Fistulagram;  Surgeon: Algernon Huxley, MD;  Location: [place] [facility];  Service: Cardiovascular;  Laterality: Right;  . PERIPHERAL VASCULAR CATHETERIZATION N/A 01/12/2015   Procedure: A/V Shunt Intervention;  Surgeon: Algernon Huxley, MD;  Location: [place] CV LAB;  Service: Cardiovascular;  Laterality: N/A;  . PERIPHERAL VASCULAR CATHETERIZATION Right 01/28/2015   Procedure: Thrombectomy;  Surgeon: Algernon Huxley, MD;  Location: [place] CV LAB;  Service: Cardiovascular;  Laterality: Right;  . PERIPHERAL VASCULAR CATHETERIZATION N/A 06/08/2015   Procedure: A/V Shuntogram/Fistulagram;  Surgeon: Algernon Huxley, MD;  Location: [place] CV LAB;  Service: Cardiovascular;  Laterality: N/A;  . PERIPHERAL VASCULAR CATHETERIZATION N/A 06/08/2015   Procedure: A/V Shunt Intervention;  Surgeon: Algernon Huxley, MD;  Location: [place] [facility] LAB;  Service: Cardiovascular;  Laterality: N/A;  . PERIPHERAL VASCULAR CATHETERIZATION Right 07/06/2015   Procedure: A/V Shuntogram/Fistulagram;  Surgeon: Algernon Huxley, MD;  Location: [place] [facility];  Service: Cardiovascular;  Laterality: Right;  . PERIPHERAL VASCULAR CATHETERIZATION N/A 07/06/2015   Procedure: A/V Shunt Intervention;  Surgeon: Algernon Huxley, MD;  Location: [place] CV LAB;  Service: Cardiovascular;  Laterality: N/A;  . PERIPHERAL VASCULAR CATHETERIZATION N/A 08/04/2015   Procedure: graft declot;  Surgeon: Katha Cabal, MD;  Location: [place] CV LAB;  Service: Cardiovascular;  Laterality: N/A;  . PERIPHERAL VASCULAR CATHETERIZATION Right 08/25/2015   Procedure: A/V Shuntogram/Fistulagram;  Surgeon: Katha Cabal, MD;  Location: [place] [facility];  Service: Cardiovascular;  Laterality: Right;  . PERIPHERAL VASCULAR CATHETERIZATION N/A 11/04/2015   Procedure: Dialysis/Perma  Catheter Insertion;  Surgeon: Algernon Huxley, MD;  Location: [place] [facility];  Service: Cardiovascular;  Laterality: N/A;  . PERIPHERAL VASCULAR CATHETERIZATION Left 12/14/2015   Procedure: A/V Shuntogram/Fistulagram;  Surgeon: Algernon Huxley, MD;  Location: [place] [facility];  Service: Cardiovascular;  Laterality: Left;  . PERIPHERAL VASCULAR CATHETERIZATION N/A 12/14/2015   Procedure: A/V Shunt Intervention;  Surgeon: Algernon Huxley, MD;  Location: [place] [facility] LAB;  Service: Cardiovascular;  Laterality: N/A;  . PERIPHERAL VASCULAR CATHETERIZATION N/A 12/17/2015   Procedure: Dialysis/Perma Catheter Insertion;  Surgeon: Algernon Huxley, MD;  Location: [place] CV LAB;  Service: Cardiovascular;  Laterality: N/A;  . PERIPHERAL VASCULAR CATHETERIZATION Left 12/22/2015   Procedure: Dialysis/Perma Catheter Insertion;  Surgeon: Katha Cabal, MD;  Location: [place] [facility];  Service: Cardiovascular;  Laterality: Left;  . PERIPHERAL VASCULAR CATHETERIZATION Left 02/29/2016   Procedure: A/V Shuntogram/Fistulagram;  Surgeon: Algernon Huxley, MD;  Location: [place] CV LAB;  Service: Cardiovascular;  Laterality: Left;  . PERIPHERAL VASCULAR CATHETERIZATION  N/A 02/29/2016   Procedure: A/V Shunt Intervention;  Surgeon: Algernon Huxley, MD;  Location: [place] [facility];  Service: Cardiovascular;  Laterality: N/A;  . POCKET REVISION/RELOCATION N/A 01/01/2018   Procedure: POCKET REVISION/RELOCATION;  Surgeon: Evans Lance, MD;  Location: [place] [facility];  Service: Cardiovascular;  Laterality: N/A;  . right arm graft     for dyalisis  . RIGHT/LEFT HEART CATH AND CORONARY ANGIOGRAPHY N/A 08/02/2017   Procedure: RIGHT/LEFT HEART CATH AND CORONARY ANGIOGRAPHY;  Surgeon: Larey Dresser, MD;  Location: [place] [facility];  Service: Cardiovascular;  Laterality: N/A;  . SUBQ ICD IMPLANT N/A 11/28/2017   Procedure: SUBQ ICD IMPLANT;  Surgeon: Evans Lance, MD;  Location: [place] CV LAB;  Service:  Cardiovascular;  Laterality: N/A;  . TEE WITHOUT CARDIOVERSION N/A 07/31/2017   Procedure: TRANSESOPHAGEAL ECHOCARDIOGRAM (TEE);  Surgeon: Josue Hector, MD;  Location: [facility] ENDOSCOPY;  Service: Cardiovascular;  Laterality: N/A;  . TEE WITHOUT CARDIOVERSION N/A 09/01/2017   Procedure: TRANSESOPHAGEAL ECHOCARDIOGRAM (TEE);  Surgeon: [name], MD;  Location: [place];  Service: Open Heart Surgery;  Laterality: N/A;  . THROMBECTOMY    . THROMBECTOMY W/ EMBOLECTOMY  03/01/2011   Procedure: THROMBECTOMY ARTERIOVENOUS GORE-TEX GRAFT;  Surgeon: Elam Dutch, MD;  Location: [place];  Service: Vascular;  Laterality: Right;  Attempted Thrombectomy of Old  Right Upper Arm Arteriovenous gortex Graft. Insertion of new Arteriovenous Graft using 46mm x 50cm Gortex Stretch graft.   . THROMBECTOMY W/ EMBOLECTOMY  07/11/2011   Procedure: THROMBECTOMY ARTERIOVENOUS GORE-TEX GRAFT;  Surgeon: Rosetta Posner, MD;  Location: [place];  Service: Vascular;  Laterality: Right;  . UMBILICAL HERNIA REPAIR    . VENOGRAM N/A 08/23/2011   Procedure: VENOGRAM;  Surgeon: [name], MD;  Location: [facility] CATH LAB;  Service: Cardiovascular;  Laterality: N/A;       Home Medications    Prior to Admission medications   Medication Sig Start Date End Date Taking? Authorizing Provider  acetaminophen-codeine (TYLENOL #3) 300-30 MG tablet Take 1-2 tablets by mouth every 6 (six) hours as needed for moderate pain. 01/05/18   Evans Lance, MD  amiodarone (PACERONE) 200 MG tablet Take 200 mg by mouth daily.    [provider]  aspirin EC 81 MG EC tablet Take 1 tablet (81 mg total) by mouth daily. 09/11/17   Barrett, Erin R, PA-C  atorvastatin (LIPITOR) 80 MG tablet Take 1 tablet (80 mg total) by mouth daily at 6 PM. 10/31/17   Larey Dresser, MD  B Complex-C-Folic Acid (NEPHRO-VITE PO) Take 1 tablet by mouth daily.     [provider]  chlorhexidine (PERIDEX) 0.12 % solution Use as directed 15 mLs in the mouth or  throat 3 (three) times daily as needed.     [provider]  FOSRENOL 500 MG chewable tablet Chew 1,500-2,000 mg by mouth See admin instructions. Take 2000 mg by mouth with meals and take 1500 mg by mouth with snacks 05/28/13   [provider]  metoprolol tartrate (LOPRESSOR) 25 MG tablet Take 1 tablet (25 mg total) by mouth 2 (two) times daily. 11/01/17 01/30/18  Herminio Commons, MD  midodrine (PROAMATINE) 5 MG tablet Take 3 tablets (15 mg total) by mouth 3 (three) times daily with meals. 01/09/18   Evans Lance, MD  oxyCODONE (OXY IR/ROXICODONE) 5 MG immediate release tablet Take 1 tablet (5 mg total) by mouth every 4 (four) hours as needed for severe  pain. 09/10/17   Barrett, Lodema Hong, PA-C  warfarin (COUMADIN) 5 MG tablet Take 1 tablet daily except 1/2 tablet on Tuesdays, Thursdays and Saturdays or as directed by coumadin clinic Patient taking differently: Take 5 mg by mouth daily.  09/14/17   Herminio Commons, MD    Family History Family History  Problem Relation Age of Onset  . Hypertension Mother   . Heart disease Mother   . Hypertension Father   . Heart disease Father     Social History Social History   Tobacco Use  . Smoking status: Former Smoker    Types: Cigarettes    Last attempt to quit: 04/18/1994    Years since quitting: 23.7  . Smokeless tobacco: Current User    Types: Snuff  . Tobacco comment: dips 1/2 snuff per day times 25 years  Substance Use Topics  . Alcohol use: No  . Drug use: No     Allergies   Patient has no known allergies.   Review of Systems Review of Systems  Constitutional: Negative for activity change, chills, diaphoresis and fever.  HENT: Negative for congestion and sore throat.   Eyes: Negative for discharge.  Respiratory: Negative for apnea, cough, choking, chest tightness, shortness of breath and wheezing.   Gastrointestinal: Negative for abdominal distention, abdominal pain, nausea and vomiting.  Genitourinary:  Negative.   Musculoskeletal: Negative for arthralgias and myalgias.  Skin: Positive for wound. Negative for color change, pallor and rash.       App. 1" of wound is slightly open with weeping serosanguinous fluid.  Patient had multiple soaked towels on bed with him but we were unable to express significant purulence.  There were some inconsistent pockets of firmness but no visible significant erythema/discoloration  Neurological: Negative for tremors, light-headedness and headaches.    Physical Exam Updated Vital Signs BP 130/84 (BP Location: Right Arm)   Pulse 88   Temp 98.4 F (36.9 C) (Oral)   Resp 16   Ht 6' (1.829 m)   Wt (!) 138.3 kg   SpO2 99%   BMI 41.37 kg/m   Physical Exam  Constitutional: He is oriented to person, place, and time. He appears well-developed and well-nourished. No distress.  HENT:  Head: Normocephalic.  Eyes: Conjunctivae are normal.  Cardiovascular: Normal rate and regular rhythm.  Audible click  Pulmonary/Chest: Effort normal and breath sounds normal. No respiratory distress. He has no wheezes.  Abdominal: Soft. There is no tenderness.  Musculoskeletal: He exhibits no deformity.  Neurological: He is alert and oriented to person, place, and time. Coordination normal.  Skin: Skin is warm and dry. Capillary refill takes less than 2 seconds. He is not diaphoretic. No erythema.  App. 1" of wound is slightly open with weeping serosanguinous fluid.  Patient had multiple soaked towels on bed with him but we were unable to express significant purulence.  There were some inconsistent pockets of firmness but no visible significant erythema/discoloration  Psychiatric: He has a normal mood and affect. His behavior is normal. Judgment and thought content normal.  Nursing note and vitals reviewed.    ED Treatments / Results  Labs (all labs ordered are listed, but only abnormal results are displayed) Labs Reviewed  PROTIME-INR - Abnormal; Notable for the  following components:      Result Value   Prothrombin Time 22.5 (*)    All other components within normal limits  CBC WITH DIFFERENTIAL/PLATELET - Abnormal; Notable for the following components:   Hemoglobin 11.0 (*)  HCT 38.0 (*)    MCH 24.6 (*)    MCHC 28.9 (*)    RDW 16.1 (*)    All other components within normal limits  BASIC METABOLIC PANEL - Abnormal; Notable for the following components:   Chloride 97 (*)    BUN 22 (*)    Creatinine, Ser 5.66 (*)    Calcium 8.6 (*)    GFR calc non Af Amer 10 (*)    GFR calc Af Amer 12 (*)    All other components within normal limits  C-REACTIVE PROTEIN - Abnormal; Notable for the following components:   CRP 6.7 (*)    All other components within normal limits  SEDIMENTATION RATE    EKG None  Radiology Dg Chest 2 View  Result Date: 01/13/2018 CLINICAL DATA:  Status post defibrillator placement, swelling/drainage EXAM: CHEST - 2 VIEW COMPARISON:  [facility] CT chest dated 01/13/2018 FINDINGS: Lungs are clear.  No pleural effusion or pneumothorax. The heart is normal in size.  Prosthetic valve. Single lead left chest ICD overlying the sternum. Associated gas superior to the pacemaker device in the left chest wall, corresponding to a 8.1 x 10.9 cm fluid/gas collection on CT. Median sternotomy. Surgical clips overlying the left neck. IMPRESSION: Soft tissue gas superior to the pacemaker device in the left chest wall, corresponding to 8.1 x 10.9 cm fluid/gas collection on CT. Electronically Signed   By: Julian Hy M.D.   On: 01/13/2018 22:21    Procedures Procedures (including critical care time)  Medications Ordered in ED Medications - No data to display   Initial Impression / Assessment and Plan / ED Course  I have reviewed the triage vital signs and the nursing notes.  Pertinent labs & imaging results that were available during my care of the patient were reviewed by me and considered in my medical decision making (see  chart for details).   Patient with stable presentation and vitals, still with signficant drainage from surgical site of 8/13 ICD placement.  Will order basic labs and INR (on warfarin) w/ CXR.  We have called [facility] cardiology and they agree to come evaluate this complicated patient.  Cardiology has evaluated patient and he does not wish to be admitted.  The patient is stable with no indication of systemic infection and labs are reassuring.  With outpatient followup scheduled with cardiology on Monday, we agree with patient that they are safe to d/c to outpatient cardiology care.  Return precautions discussed and patient agrees.  Final Clinical Impressions(s) / ED Diagnoses   Final diagnoses:  Visit for wound check    ED Discharge Orders    None       Sherene Sires, DO 01/13/18 2313    Pattricia Boss, MD 01/15/18 [phone]

## 2018-01-15 ENCOUNTER — Ambulatory Visit (INDEPENDENT_AMBULATORY_CARE_PROVIDER_SITE_OTHER): Payer: Medicare Other

## 2018-01-15 ENCOUNTER — Ambulatory Visit (INDEPENDENT_AMBULATORY_CARE_PROVIDER_SITE_OTHER): Payer: Medicare Other | Admitting: *Deleted

## 2018-01-15 DIAGNOSIS — T82868D Thrombosis of vascular prosthetic devices, implants and grafts, subsequent encounter: Secondary | ICD-10-CM

## 2018-01-15 DIAGNOSIS — Z5181 Encounter for therapeutic drug level monitoring: Secondary | ICD-10-CM

## 2018-01-15 DIAGNOSIS — I429 Cardiomyopathy, unspecified: Secondary | ICD-10-CM

## 2018-01-15 DIAGNOSIS — N186 End stage renal disease: Secondary | ICD-10-CM | POA: Diagnosis not present

## 2018-01-15 DIAGNOSIS — Z992 Dependence on renal dialysis: Secondary | ICD-10-CM | POA: Diagnosis not present

## 2018-01-15 DIAGNOSIS — Z954 Presence of other heart-valve replacement: Secondary | ICD-10-CM | POA: Diagnosis not present

## 2018-01-15 LAB — POCT INR: INR: 1.9 — AB (ref 2.0–3.0)

## 2018-01-15 NOTE — Progress Notes (Signed)
Wound re-check, bandage removed no drainage noted, site appears smaller per pt, site dressed with 4x4s and paper tape. pt denied any fever chills, pt to return on 01/16/2018 for wound assessment by GT.

## 2018-01-15 NOTE — Patient Instructions (Addendum)
Description   Continue on same dosage Coumadin 5mg  daily. Recheck in 1 week.

## 2018-01-16 ENCOUNTER — Ambulatory Visit (INDEPENDENT_AMBULATORY_CARE_PROVIDER_SITE_OTHER): Payer: Medicare Other | Admitting: *Deleted

## 2018-01-16 DIAGNOSIS — N2581 Secondary hyperparathyroidism of renal origin: Secondary | ICD-10-CM | POA: Diagnosis not present

## 2018-01-16 DIAGNOSIS — Z992 Dependence on renal dialysis: Secondary | ICD-10-CM | POA: Diagnosis not present

## 2018-01-16 DIAGNOSIS — D509 Iron deficiency anemia, unspecified: Secondary | ICD-10-CM | POA: Diagnosis not present

## 2018-01-16 DIAGNOSIS — Z23 Encounter for immunization: Secondary | ICD-10-CM | POA: Diagnosis not present

## 2018-01-16 DIAGNOSIS — N186 End stage renal disease: Secondary | ICD-10-CM | POA: Diagnosis not present

## 2018-01-16 DIAGNOSIS — E611 Iron deficiency: Secondary | ICD-10-CM | POA: Diagnosis not present

## 2018-01-16 DIAGNOSIS — I255 Ischemic cardiomyopathy: Secondary | ICD-10-CM

## 2018-01-16 MED ORDER — AMIODARONE HCL 200 MG PO TABS: 200.0000 mg | ORAL_TABLET | Freq: Every day | ORAL | 3 refills | Status: AC

## 2018-01-16 NOTE — Progress Notes (Signed)
Wound re-check less edema noted, no drainage noted. Site assessed by GT pt to return on 01/26/2018 with GT in office.

## 2018-01-17 NOTE — Progress Notes (Signed)
Wound recheck. Hematoma noted - no drainage at present. Dr.Taylor assessed wound and recommended redressing the site with 4x4 gauze and tegaderm. Dr.Taylor instructed patient to call if he notices additional drainage, and to proceed to ER for fever/chills. Patient/wife verbalized understanding. Patient to follow up next week for another wound recheck.

## 2018-01-18 DIAGNOSIS — N186 End stage renal disease: Secondary | ICD-10-CM | POA: Diagnosis not present

## 2018-01-18 DIAGNOSIS — Z992 Dependence on renal dialysis: Secondary | ICD-10-CM | POA: Diagnosis not present

## 2018-01-18 DIAGNOSIS — N2581 Secondary hyperparathyroidism of renal origin: Secondary | ICD-10-CM | POA: Diagnosis not present

## 2018-01-18 DIAGNOSIS — D509 Iron deficiency anemia, unspecified: Secondary | ICD-10-CM | POA: Diagnosis not present

## 2018-01-18 DIAGNOSIS — E611 Iron deficiency: Secondary | ICD-10-CM | POA: Diagnosis not present

## 2018-01-18 DIAGNOSIS — Z23 Encounter for immunization: Secondary | ICD-10-CM | POA: Diagnosis not present

## 2018-01-20 DIAGNOSIS — Z23 Encounter for immunization: Secondary | ICD-10-CM | POA: Diagnosis not present

## 2018-01-20 DIAGNOSIS — E611 Iron deficiency: Secondary | ICD-10-CM | POA: Diagnosis not present

## 2018-01-20 DIAGNOSIS — D509 Iron deficiency anemia, unspecified: Secondary | ICD-10-CM | POA: Diagnosis not present

## 2018-01-20 DIAGNOSIS — Z992 Dependence on renal dialysis: Secondary | ICD-10-CM | POA: Diagnosis not present

## 2018-01-20 DIAGNOSIS — N2581 Secondary hyperparathyroidism of renal origin: Secondary | ICD-10-CM | POA: Diagnosis not present

## 2018-01-20 DIAGNOSIS — N186 End stage renal disease: Secondary | ICD-10-CM | POA: Diagnosis not present

## 2018-01-23 ENCOUNTER — Ambulatory Visit (INDEPENDENT_AMBULATORY_CARE_PROVIDER_SITE_OTHER): Payer: Medicare Other | Admitting: *Deleted

## 2018-01-23 DIAGNOSIS — N2581 Secondary hyperparathyroidism of renal origin: Secondary | ICD-10-CM | POA: Diagnosis not present

## 2018-01-23 DIAGNOSIS — T82868D Thrombosis of vascular prosthetic devices, implants and grafts, subsequent encounter: Secondary | ICD-10-CM | POA: Diagnosis not present

## 2018-01-23 DIAGNOSIS — Z954 Presence of other heart-valve replacement: Secondary | ICD-10-CM | POA: Diagnosis not present

## 2018-01-23 DIAGNOSIS — E611 Iron deficiency: Secondary | ICD-10-CM | POA: Diagnosis not present

## 2018-01-23 DIAGNOSIS — Z992 Dependence on renal dialysis: Secondary | ICD-10-CM | POA: Diagnosis not present

## 2018-01-23 DIAGNOSIS — D509 Iron deficiency anemia, unspecified: Secondary | ICD-10-CM | POA: Diagnosis not present

## 2018-01-23 DIAGNOSIS — N186 End stage renal disease: Secondary | ICD-10-CM | POA: Diagnosis not present

## 2018-01-23 DIAGNOSIS — Z5181 Encounter for therapeutic drug level monitoring: Secondary | ICD-10-CM | POA: Diagnosis not present

## 2018-01-23 DIAGNOSIS — Z23 Encounter for immunization: Secondary | ICD-10-CM | POA: Diagnosis not present

## 2018-01-23 LAB — POCT INR: INR: 2 (ref 2.0–3.0)

## 2018-01-23 NOTE — Patient Instructions (Signed)
Continue coumadin 5mg  daily. Recheck in 1 week.  Still dealing with defib pocket draining occasionally.  Has appt with Dr Lovena Le 10/11

## 2018-01-25 DIAGNOSIS — D509 Iron deficiency anemia, unspecified: Secondary | ICD-10-CM | POA: Diagnosis not present

## 2018-01-25 DIAGNOSIS — Z23 Encounter for immunization: Secondary | ICD-10-CM | POA: Diagnosis not present

## 2018-01-25 DIAGNOSIS — N186 End stage renal disease: Secondary | ICD-10-CM | POA: Diagnosis not present

## 2018-01-25 DIAGNOSIS — Z992 Dependence on renal dialysis: Secondary | ICD-10-CM | POA: Diagnosis not present

## 2018-01-25 DIAGNOSIS — N2581 Secondary hyperparathyroidism of renal origin: Secondary | ICD-10-CM | POA: Diagnosis not present

## 2018-01-25 DIAGNOSIS — E611 Iron deficiency: Secondary | ICD-10-CM | POA: Diagnosis not present

## 2018-01-26 ENCOUNTER — Ambulatory Visit (INDEPENDENT_AMBULATORY_CARE_PROVIDER_SITE_OTHER): Payer: Medicare Other | Admitting: *Deleted

## 2018-01-26 DIAGNOSIS — T82837D Hemorrhage of cardiac prosthetic devices, implants and grafts, subsequent encounter: Secondary | ICD-10-CM

## 2018-01-26 MED ORDER — CEPHALEXIN 500 MG PO CAPS: 500.0000 mg | ORAL_CAPSULE | Freq: Two times a day (BID) | ORAL | 0 refills | Status: AC

## 2018-01-26 NOTE — Patient Instructions (Signed)
Your physician has recommended you make the following change in your medication: TAKE KEFLEX 500MG  TWICE DAILY FOR 7 DAYS  Your physician recommends that you schedule a follow-up appointment on 02/09/18 @ 8:00am

## 2018-01-26 NOTE — Progress Notes (Signed)
Patient presents to the office for a wound recheck d/t ongoing edema/drainage. Bandage removed, no drainage noted on dressing. Patient states that it last drained on 01/24/18. Small opening noted at incision, stitch removed - GT aware. No redness noted. Dr.Taylor assessed wound and recommended that patient take Keflex 500mg  BID, x 7days d/t stitch, and then f/u w/DC on 10/25 @0800  (GT to come over from hospital). Dr.Taylor instructed patient to proceed to the ER for any fever or chills in the interim. Patient verbalized understanding.

## 2018-01-27 DIAGNOSIS — Z992 Dependence on renal dialysis: Secondary | ICD-10-CM | POA: Diagnosis not present

## 2018-01-27 DIAGNOSIS — Z23 Encounter for immunization: Secondary | ICD-10-CM | POA: Diagnosis not present

## 2018-01-27 DIAGNOSIS — D509 Iron deficiency anemia, unspecified: Secondary | ICD-10-CM | POA: Diagnosis not present

## 2018-01-27 DIAGNOSIS — E611 Iron deficiency: Secondary | ICD-10-CM | POA: Diagnosis not present

## 2018-01-27 DIAGNOSIS — N186 End stage renal disease: Secondary | ICD-10-CM | POA: Diagnosis not present

## 2018-01-27 DIAGNOSIS — N2581 Secondary hyperparathyroidism of renal origin: Secondary | ICD-10-CM | POA: Diagnosis not present

## 2018-01-30 ENCOUNTER — Ambulatory Visit (INDEPENDENT_AMBULATORY_CARE_PROVIDER_SITE_OTHER): Payer: Medicare Other | Admitting: *Deleted

## 2018-01-30 DIAGNOSIS — Z5181 Encounter for therapeutic drug level monitoring: Secondary | ICD-10-CM

## 2018-01-30 DIAGNOSIS — Z992 Dependence on renal dialysis: Secondary | ICD-10-CM | POA: Diagnosis not present

## 2018-01-30 DIAGNOSIS — D509 Iron deficiency anemia, unspecified: Secondary | ICD-10-CM | POA: Diagnosis not present

## 2018-01-30 DIAGNOSIS — Z954 Presence of other heart-valve replacement: Secondary | ICD-10-CM

## 2018-01-30 DIAGNOSIS — Z23 Encounter for immunization: Secondary | ICD-10-CM | POA: Diagnosis not present

## 2018-01-30 DIAGNOSIS — T82868D Thrombosis of vascular prosthetic devices, implants and grafts, subsequent encounter: Secondary | ICD-10-CM

## 2018-01-30 DIAGNOSIS — N2581 Secondary hyperparathyroidism of renal origin: Secondary | ICD-10-CM | POA: Diagnosis not present

## 2018-01-30 DIAGNOSIS — E611 Iron deficiency: Secondary | ICD-10-CM | POA: Diagnosis not present

## 2018-01-30 DIAGNOSIS — N186 End stage renal disease: Secondary | ICD-10-CM | POA: Diagnosis not present

## 2018-01-30 LAB — POCT INR: INR: 3.2 — AB (ref 2.0–3.0)

## 2018-01-30 NOTE — Patient Instructions (Signed)
Hold coumadin tonight then resume 5mg  daily. Recheck in 2 weeks. On Keflex 500mg  bid x 7 days.  Started 10/11.  Will finish 10/17 Defib pocket not draining but stitch remove 10/11.  Has appt with Dr Lovena Le 10/25 to check site.

## 2018-02-03 DIAGNOSIS — Z992 Dependence on renal dialysis: Secondary | ICD-10-CM | POA: Diagnosis not present

## 2018-02-03 DIAGNOSIS — D509 Iron deficiency anemia, unspecified: Secondary | ICD-10-CM | POA: Diagnosis not present

## 2018-02-03 DIAGNOSIS — N186 End stage renal disease: Secondary | ICD-10-CM | POA: Diagnosis not present

## 2018-02-03 DIAGNOSIS — N2581 Secondary hyperparathyroidism of renal origin: Secondary | ICD-10-CM | POA: Diagnosis not present

## 2018-02-03 DIAGNOSIS — E611 Iron deficiency: Secondary | ICD-10-CM | POA: Diagnosis not present

## 2018-02-03 DIAGNOSIS — Z23 Encounter for immunization: Secondary | ICD-10-CM | POA: Diagnosis not present

## 2018-02-06 DIAGNOSIS — N2581 Secondary hyperparathyroidism of renal origin: Secondary | ICD-10-CM | POA: Diagnosis not present

## 2018-02-06 DIAGNOSIS — N186 End stage renal disease: Secondary | ICD-10-CM | POA: Diagnosis not present

## 2018-02-06 DIAGNOSIS — D509 Iron deficiency anemia, unspecified: Secondary | ICD-10-CM | POA: Diagnosis not present

## 2018-02-06 DIAGNOSIS — Z992 Dependence on renal dialysis: Secondary | ICD-10-CM | POA: Diagnosis not present

## 2018-02-06 DIAGNOSIS — E611 Iron deficiency: Secondary | ICD-10-CM | POA: Diagnosis not present

## 2018-02-06 DIAGNOSIS — Z23 Encounter for immunization: Secondary | ICD-10-CM | POA: Diagnosis not present

## 2018-02-08 DIAGNOSIS — Z23 Encounter for immunization: Secondary | ICD-10-CM | POA: Diagnosis not present

## 2018-02-08 DIAGNOSIS — N186 End stage renal disease: Secondary | ICD-10-CM | POA: Diagnosis not present

## 2018-02-08 DIAGNOSIS — N2581 Secondary hyperparathyroidism of renal origin: Secondary | ICD-10-CM | POA: Diagnosis not present

## 2018-02-08 DIAGNOSIS — E611 Iron deficiency: Secondary | ICD-10-CM | POA: Diagnosis not present

## 2018-02-08 DIAGNOSIS — Z992 Dependence on renal dialysis: Secondary | ICD-10-CM | POA: Diagnosis not present

## 2018-02-08 DIAGNOSIS — D509 Iron deficiency anemia, unspecified: Secondary | ICD-10-CM | POA: Diagnosis not present

## 2018-02-09 ENCOUNTER — Ambulatory Visit (INDEPENDENT_AMBULATORY_CARE_PROVIDER_SITE_OTHER): Payer: Medicare Other | Admitting: *Deleted

## 2018-02-09 DIAGNOSIS — I255 Ischemic cardiomyopathy: Secondary | ICD-10-CM

## 2018-02-09 DIAGNOSIS — T82837D Hemorrhage of cardiac prosthetic devices, implants and grafts, subsequent encounter: Secondary | ICD-10-CM

## 2018-02-09 LAB — CBC WITH DIFFERENTIAL/PLATELET
BASOS ABS: 0.1 10*3/uL (ref 0.0–0.2)
BASOS: 1 %
EOS (ABSOLUTE): 0.7 10*3/uL — AB (ref 0.0–0.4)
Eos: 8 %
HEMOGLOBIN: 10.9 g/dL — AB (ref 13.0–17.7)
Hematocrit: 35.8 % — ABNORMAL LOW (ref 37.5–51.0)
IMMATURE GRANS (ABS): 0 10*3/uL (ref 0.0–0.1)
Immature Granulocytes: 0 %
LYMPHS ABS: 1 10*3/uL (ref 0.7–3.1)
LYMPHS: 12 %
MCH: 24.3 pg — AB (ref 26.6–33.0)
MCHC: 30.4 g/dL — AB (ref 31.5–35.7)
MCV: 80 fL (ref 79–97)
Monocytes Absolute: 0.7 10*3/uL (ref 0.1–0.9)
Monocytes: 8 %
NEUTROS ABS: 5.9 10*3/uL (ref 1.4–7.0)
Neutrophils: 71 %
Platelets: 260 10*3/uL (ref 150–450)
RBC: 4.49 x10E6/uL (ref 4.14–5.80)
RDW: 15.9 % — ABNORMAL HIGH (ref 12.3–15.4)
WBC: 8.4 10*3/uL (ref 3.4–10.8)

## 2018-02-09 LAB — BASIC METABOLIC PANEL
BUN / CREAT RATIO: 6 — AB (ref 9–20)
BUN: 40 mg/dL — ABNORMAL HIGH (ref 6–24)
CALCIUM: 9 mg/dL (ref 8.7–10.2)
CO2: 25 mmol/L (ref 20–29)
Chloride: 96 mmol/L (ref 96–106)
Creatinine, Ser: 7.23 mg/dL — ABNORMAL HIGH (ref 0.76–1.27)
GFR, EST AFRICAN AMERICAN: 9 mL/min/{1.73_m2} — AB (ref >59)
GFR, EST NON AFRICAN AMERICAN: 8 mL/min/{1.73_m2} — AB (ref >59)
Glucose: 75 mg/dL (ref 65–99)
Potassium: 3.9 mmol/L (ref 3.5–5.2)
Sodium: 141 mmol/L (ref 134–144)

## 2018-02-09 LAB — PROTIME-INR
INR: 1.9 — ABNORMAL HIGH (ref 0.8–1.2)
Prothrombin Time: 18.8 s — ABNORMAL HIGH (ref 9.1–12.0)

## 2018-02-09 NOTE — Patient Instructions (Addendum)
Medication Instructions:  Your physician recommends that you continue on your current medications as directed. Please refer to the Current Medication list given to you today.  Labwork: You will get lab work today:  Bmp, cbc and pt/inr  Testing/Procedures: Dr. Lovena Le recommends we remove your device.  Follow-Up: You will follow up with the device clinic 10-14 days after your procedure for a wound check.  Further follow up will be determined at that wound check.  Any Other Special Instructions Will Be Listed Below (If Applicable).  If you need a refill on your cardiac medications before your next appointment, please call your pharmacy.   Please arrive at the [facility] main entrance of [place] hospital at:  February 13, 2018 at 9:30 am Do not eat or drink after midnight prior to procedure You may take your normal morning medications with a sip of water I will call you today with warfarin instruction Plan for one night stay-but you may be discharged after your procedure You will need someone to drive you home at discharge

## 2018-02-09 NOTE — Progress Notes (Signed)
Wound re-check, moderate amount of serosanguinous drainage noted on bandage per pt and pts wife bandage was applied last night, opening of incision site with active drainage noted. Dr. Lovena Le assessed site plan for device removal on 02/12/18. Pt to continue dressing incision site.

## 2018-02-10 DIAGNOSIS — Z23 Encounter for immunization: Secondary | ICD-10-CM | POA: Diagnosis not present

## 2018-02-10 DIAGNOSIS — N2581 Secondary hyperparathyroidism of renal origin: Secondary | ICD-10-CM | POA: Diagnosis not present

## 2018-02-10 DIAGNOSIS — N186 End stage renal disease: Secondary | ICD-10-CM | POA: Diagnosis not present

## 2018-02-10 DIAGNOSIS — Z992 Dependence on renal dialysis: Secondary | ICD-10-CM | POA: Diagnosis not present

## 2018-02-10 DIAGNOSIS — E611 Iron deficiency: Secondary | ICD-10-CM | POA: Diagnosis not present

## 2018-02-10 DIAGNOSIS — D509 Iron deficiency anemia, unspecified: Secondary | ICD-10-CM | POA: Diagnosis not present

## 2018-02-12 ENCOUNTER — Encounter (HOSPITAL_COMMUNITY): Payer: Self-pay | Admitting: Certified Registered Nurse Anesthetist

## 2018-02-12 ENCOUNTER — Encounter (HOSPITAL_COMMUNITY)
Admission: RE | Disposition: A | Discharge status: Home / Self Care | Payer: Self-pay | Source: Ambulatory Visit | Attending: Internal Medicine

## 2018-02-12 ENCOUNTER — Ambulatory Visit (HOSPITAL_COMMUNITY)
Admission: RE | Admit: 2018-02-12 | Discharge: 2018-02-13 | Disposition: A | Discharge status: Home / Self Care | Payer: Medicare Other | Source: Ambulatory Visit | Attending: Internal Medicine | Admitting: Internal Medicine

## 2018-02-12 DIAGNOSIS — Z7982 Long term (current) use of aspirin: Secondary | ICD-10-CM | POA: Diagnosis not present

## 2018-02-12 DIAGNOSIS — Z7901 Long term (current) use of anticoagulants: Secondary | ICD-10-CM | POA: Diagnosis not present

## 2018-02-12 DIAGNOSIS — I5022 Chronic systolic (congestive) heart failure: Secondary | ICD-10-CM | POA: Insufficient documentation

## 2018-02-12 DIAGNOSIS — Z87891 Personal history of nicotine dependence: Secondary | ICD-10-CM | POA: Insufficient documentation

## 2018-02-12 DIAGNOSIS — Y838 Other surgical procedures as the cause of abnormal reaction of the patient, or of later complication, without mention of misadventure at the time of the procedure: Secondary | ICD-10-CM | POA: Insufficient documentation

## 2018-02-12 DIAGNOSIS — Z8249 Family history of ischemic heart disease and other diseases of the circulatory system: Secondary | ICD-10-CM | POA: Insufficient documentation

## 2018-02-12 DIAGNOSIS — N186 End stage renal disease: Secondary | ICD-10-CM | POA: Insufficient documentation

## 2018-02-12 DIAGNOSIS — Z992 Dependence on renal dialysis: Secondary | ICD-10-CM | POA: Diagnosis not present

## 2018-02-12 DIAGNOSIS — I97638 Postprocedural hematoma of a circulatory system organ or structure following other circulatory system procedure: Secondary | ICD-10-CM | POA: Diagnosis not present

## 2018-02-12 DIAGNOSIS — I132 Hypertensive heart and chronic kidney disease with heart failure and with stage 5 chronic kidney disease, or end stage renal disease: Secondary | ICD-10-CM | POA: Insufficient documentation

## 2018-02-12 DIAGNOSIS — Z951 Presence of aortocoronary bypass graft: Secondary | ICD-10-CM | POA: Insufficient documentation

## 2018-02-12 DIAGNOSIS — T827XXS Infection and inflammatory reaction due to other cardiac and vascular devices, implants and grafts, sequela: Secondary | ICD-10-CM | POA: Diagnosis not present

## 2018-02-12 DIAGNOSIS — Y713 Surgical instruments, materials and cardiovascular devices (including sutures) associated with adverse incidents: Secondary | ICD-10-CM | POA: Diagnosis not present

## 2018-02-12 DIAGNOSIS — I2510 Atherosclerotic heart disease of native coronary artery without angina pectoris: Secondary | ICD-10-CM | POA: Diagnosis not present

## 2018-02-12 DIAGNOSIS — Z6841 Body Mass Index (BMI) 40.0 and over, adult: Secondary | ICD-10-CM | POA: Insufficient documentation

## 2018-02-12 DIAGNOSIS — T827XXA Infection and inflammatory reaction due to other cardiac and vascular devices, implants and grafts, initial encounter: Secondary | ICD-10-CM | POA: Diagnosis not present

## 2018-02-12 DIAGNOSIS — B999 Unspecified infectious disease: Secondary | ICD-10-CM | POA: Insufficient documentation

## 2018-02-12 DIAGNOSIS — Z952 Presence of prosthetic heart valve: Secondary | ICD-10-CM | POA: Diagnosis not present

## 2018-02-12 DIAGNOSIS — T82837A Hemorrhage of cardiac prosthetic devices, implants and grafts, initial encounter: Secondary | ICD-10-CM | POA: Diagnosis present

## 2018-02-12 HISTORY — PX: SUBQ ICD REVISION: EP1224

## 2018-02-12 LAB — SURGICAL PCR SCREEN
MRSA, PCR: NEGATIVE
STAPHYLOCOCCUS AUREUS: NEGATIVE

## 2018-02-12 LAB — PROTIME-INR
INR: 1.56
Prothrombin Time: 18.5 seconds — ABNORMAL HIGH (ref 11.4–15.2)

## 2018-02-12 SURGERY — SUBQ ICD REVISION
Anesthesia: General

## 2018-02-12 MED ORDER — SODIUM CHLORIDE 0.9 % IV SOLN
INTRAVENOUS | Status: DC
  Administered 2018-02-12: 11:00:00 via INTRAVENOUS

## 2018-02-12 MED ORDER — LANTHANUM CARBONATE 500 MG PO CHEW
2000.0000 mg | CHEWABLE_TABLET | Freq: Three times a day (TID) | ORAL | Status: DC
  Administered 2018-02-13: 08:00:00 2000 mg via ORAL
  Filled 2018-02-12 (×2): qty 4

## 2018-02-12 MED ORDER — MIDAZOLAM HCL 5 MG/5ML IJ SOLN
INTRAMUSCULAR | Status: AC
  Filled 2018-02-12: qty 5

## 2018-02-12 MED ORDER — AMIODARONE HCL 200 MG PO TABS
200.0000 mg | ORAL_TABLET | Freq: Every day | ORAL | Status: DC
  Administered 2018-02-13: 10:00:00 200 mg via ORAL
  Filled 2018-02-12: qty 1

## 2018-02-12 MED ORDER — CEFAZOLIN SODIUM-DEXTROSE 1-4 GM/50ML-% IV SOLN
1.0000 g | Freq: Four times a day (QID) | INTRAVENOUS | Status: AC
  Administered 2018-02-12 – 2018-02-13 (×3): 1 g via INTRAVENOUS
  Filled 2018-02-12 (×2): qty 50
  Filled 2018-02-12: qty 1200

## 2018-02-12 MED ORDER — ACETAMINOPHEN 325 MG PO TABS: 325.0000 mg | ORAL_TABLET | ORAL | Status: DC | PRN

## 2018-02-12 MED ORDER — MUPIROCIN 2 % EX OINT
TOPICAL_OINTMENT | CUTANEOUS | Status: AC
  Administered 2018-02-12: 1 via TOPICAL
  Filled 2018-02-12: qty 22

## 2018-02-12 MED ORDER — MIDAZOLAM HCL 5 MG/5ML IJ SOLN
INTRAMUSCULAR | Status: DC | PRN
  Administered 2018-02-12 (×2): 1 mg via INTRAVENOUS
  Administered 2018-02-12: 2 mg via INTRAVENOUS
  Administered 2018-02-12 (×3): 1 mg via INTRAVENOUS
  Administered 2018-02-12: 2 mg via INTRAVENOUS
  Administered 2018-02-12: 1 mg via INTRAVENOUS
  Administered 2018-02-12: 2 mg via INTRAVENOUS
  Administered 2018-02-12 (×2): 1 mg via INTRAVENOUS

## 2018-02-12 MED ORDER — ACETAMINOPHEN-CODEINE #3 300-30 MG PO TABS: 1.0000 | ORAL_TABLET | Freq: Four times a day (QID) | ORAL | Status: DC | PRN

## 2018-02-12 MED ORDER — MUPIROCIN 2 % EX OINT
1.0000 "application " | TOPICAL_OINTMENT | Freq: Once | CUTANEOUS | Status: AC
  Administered 2018-02-12: 1 via TOPICAL
  Filled 2018-02-12: qty 22

## 2018-02-12 MED ORDER — SODIUM CHLORIDE 0.9 % IV SOLN
INTRAVENOUS | Status: DC | PRN
  Administered 2018-02-12: 500 mL

## 2018-02-12 MED ORDER — ATORVASTATIN CALCIUM 80 MG PO TABS
80.0000 mg | ORAL_TABLET | Freq: Every day | ORAL | Status: DC
  Administered 2018-02-12: 80 mg via ORAL
  Filled 2018-02-12: qty 1

## 2018-02-12 MED ORDER — SODIUM CHLORIDE 0.9 % IV SOLN
80.0000 mg | INTRAVENOUS | Status: AC
  Administered 2018-02-12: 80 mg

## 2018-02-12 MED ORDER — LANTHANUM CARBONATE 500 MG PO CHEW
1500.0000 mg | CHEWABLE_TABLET | Freq: Two times a day (BID) | ORAL | Status: DC | PRN
  Filled 2018-02-12: qty 3

## 2018-02-12 MED ORDER — DEXTROSE 5 % IV SOLN
3.0000 g | INTRAVENOUS | Status: AC
  Administered 2018-02-12: 3 g via INTRAVENOUS
  Filled 2018-02-12: qty 3

## 2018-02-12 MED ORDER — BUPIVACAINE HCL (PF) 0.25 % IJ SOLN
INTRAMUSCULAR | Status: AC
  Filled 2018-02-12: qty 60

## 2018-02-12 MED ORDER — LANTHANUM CARBONATE 500 MG PO CHEW: 1500.0000 mg | CHEWABLE_TABLET | ORAL | Status: DC

## 2018-02-12 MED ORDER — METOPROLOL TARTRATE 25 MG PO TABS
25.0000 mg | ORAL_TABLET | Freq: Two times a day (BID) | ORAL | Status: DC
  Administered 2018-02-12 – 2018-02-13 (×2): 25 mg via ORAL
  Filled 2018-02-12 (×2): qty 1

## 2018-02-12 MED ORDER — FENTANYL CITRATE (PF) 100 MCG/2ML IJ SOLN
INTRAMUSCULAR | Status: DC | PRN
  Administered 2018-02-12 (×2): 12.5 ug via INTRAVENOUS
  Administered 2018-02-12: 25 ug via INTRAVENOUS
  Administered 2018-02-12: 12.5 ug via INTRAVENOUS
  Administered 2018-02-12 (×2): 25 ug via INTRAVENOUS
  Administered 2018-02-12: 12.5 ug via INTRAVENOUS
  Administered 2018-02-12: 25 ug via INTRAVENOUS
  Administered 2018-02-12: 12.5 ug via INTRAVENOUS
  Administered 2018-02-12: 25 ug via INTRAVENOUS

## 2018-02-12 MED ORDER — FENTANYL CITRATE (PF) 100 MCG/2ML IJ SOLN
INTRAMUSCULAR | Status: AC
  Filled 2018-02-12: qty 2

## 2018-02-12 MED ORDER — MIDODRINE HCL 5 MG PO TABS
15.0000 mg | ORAL_TABLET | Freq: Three times a day (TID) | ORAL | Status: DC
  Administered 2018-02-13: 08:00:00 15 mg via ORAL
  Filled 2018-02-12 (×2): qty 3

## 2018-02-12 MED ORDER — WARFARIN - PHYSICIAN DOSING INPATIENT: Freq: Every day | Status: DC

## 2018-02-12 MED ORDER — ASPIRIN EC 81 MG PO TBEC
81.0000 mg | DELAYED_RELEASE_TABLET | Freq: Every day | ORAL | Status: DC
  Administered 2018-02-13: 10:00:00 81 mg via ORAL
  Filled 2018-02-12: qty 1

## 2018-02-12 MED ORDER — ASPIRIN 81 MG PO TBEC: 81.0000 mg | DELAYED_RELEASE_TABLET | Freq: Every day | ORAL | Status: DC

## 2018-02-12 MED ORDER — SODIUM CHLORIDE 0.9 % IV SOLN
INTRAVENOUS | Status: AC
  Filled 2018-02-12: qty 2

## 2018-02-12 MED ORDER — BUPIVACAINE HCL (PF) 0.25 % IJ SOLN
INTRAMUSCULAR | Status: DC | PRN
  Administered 2018-02-12: 60 mL
  Administered 2018-02-12: 30 mL

## 2018-02-12 MED ORDER — OXYCODONE HCL 5 MG PO TABS
5.0000 mg | ORAL_TABLET | ORAL | Status: DC | PRN
  Administered 2018-02-12: 5 mg via ORAL
  Filled 2018-02-12: qty 1

## 2018-02-12 MED ORDER — ONDANSETRON HCL 4 MG/2ML IJ SOLN: 4.0000 mg | Freq: Four times a day (QID) | INTRAMUSCULAR | Status: DC | PRN

## 2018-02-12 MED ORDER — BUPIVACAINE HCL (PF) 0.25 % IJ SOLN
INTRAMUSCULAR | Status: AC
  Filled 2018-02-12: qty 30

## 2018-02-12 MED ORDER — CHLORHEXIDINE GLUCONATE 4 % EX LIQD
60.0000 mL | Freq: Once | CUTANEOUS | Status: DC
  Filled 2018-02-12: qty 750

## 2018-02-12 MED ORDER — WARFARIN SODIUM 5 MG PO TABS
5.0000 mg | ORAL_TABLET | Freq: Every day | ORAL | Status: DC
  Administered 2018-02-12: 22:00:00 5 mg via ORAL
  Filled 2018-02-12: qty 1

## 2018-02-12 SURGICAL SUPPLY — 3 items
CABLE SURGICAL S-101-97-12 (CABLE) ×3 IMPLANT
PAD PRO RADIOLUCENT 2001M-C (PAD) ×3 IMPLANT
TRAY PACEMAKER INSERTION (PACKS) ×3 IMPLANT

## 2018-02-12 NOTE — Progress Notes (Signed)
PHARMACIST - PHYSICIAN COMMUNICATION DR:  Lovena Le CONCERNING: Pharmacy Care Issues Regarding Warfarin Labs  RECOMMENDATION (Action Taken): A daily protime for three days has been ordered to meet the [facility] National Patient safety goal and comply with the current [place].   The Pharmacy will defer all warfarin dose order changes and follow up of lab results to the prescriber unless an additional order to initiate a "pharmacy Coumadin consult" is placed.  DESCRIPTION:  While hospitalized, to be in compliance with [facility] Patient Safety Goals, all patients on warfarin must have a baseline and/or current protime prior to the administration of warfarin. Pharmacy has received your order for warfarin without these required laboratory assessments.   Renold Genta, PharmD, BCPS Clinical Pharmacist Clinical phone for 02/12/2018 until 10p is [phone] Please check AMION for all Pharmacist numbers by unit 02/12/2018 7:21 PM

## 2018-02-12 NOTE — H&P (Signed)
HPI [name] returns today for ongoing management of his ICD. He is pleasant obese 52 yo man with ESRD on HD. He has a longstanding ICM, s/p CABG, chronic systolic heart failure who underwent SICD several weeks ago. He initially did well but then developed a pocket hematoma which was massive and underwent evacuation several days ago. The patient returns for incision check. He has started back his warfarin. He is s/p AVR with a  Mechanical prosthesis.  No Known Allergies         Current Outpatient Medications  Medication Sig Dispense Refill  . acetaminophen-codeine (TYLENOL #3) 300-30 MG tablet Take 1-2 tablets by mouth every 6 (six) hours as needed for moderate pain. 20 tablet 0  . amiodarone (PACERONE) 200 MG tablet Take 200 mg by mouth daily.    [name] aspirin EC 81 MG EC tablet Take 1 tablet (81 mg total) by mouth daily.    [name] atorvastatin (LIPITOR) 80 MG tablet Take 1 tablet (80 mg total) by mouth daily at 6 PM. 90 tablet 3  . B Complex-C-Folic Acid (NEPHRO-VITE PO) Take 1 tablet by mouth daily.     . cephALEXin (KEFLEX) 500 MG capsule Take 1 capsule (500 mg total) by mouth 2 (two) times daily for 7 days. 14 capsule 0  . chlorhexidine (PERIDEX) 0.12 % solution Use as directed 15 mLs in the mouth or throat 3 (three) times daily as needed.     [name] FOSRENOL 500 MG chewable tablet Chew 1,500-2,000 mg by mouth See admin instructions. Take 2000 mg by mouth with meals and take 1500 mg by mouth with snacks    . metoprolol tartrate (LOPRESSOR) 25 MG tablet Take 1 tablet (25 mg total) by mouth 2 (two) times daily. 60 tablet 3  . midodrine (PROAMATINE) 5 MG tablet Take 3 tablets (15 mg total) by mouth 3 (three) times daily with meals. 90 tablet 2  . oxyCODONE (OXY IR/ROXICODONE) 5 MG immediate release tablet Take 1 tablet (5 mg total) by mouth every 4 (four) hours as needed for severe pain. 30 tablet 0  . warfarin (COUMADIN) 5 MG tablet Take 1 tablet daily except 1/2 tablet on  Tuesdays, Thursdays and Saturdays or as directed by coumadin clinic (Patient taking differently: Take 5 mg by mouth daily. ) 90 tablet 1   No current facility-administered medications for this visit.          Past Medical History:  Diagnosis Date  . Aneurysm ([place])     Right arm fistula 3 aneurysms 2011,   plans to have a new procedure  . Angina   . Chest discomfort   . CHF (congestive heart failure) ([place])   . Coronary artery disease   . Coronary artery disease involving native coronary artery of native heart without angina pectoris   . Dialysis patient ([place])   . Dysrhythmia   . ESRD (end stage renal disease) ([place])    on hemodialysis T_T_S  . Hypertension   . Leg pain   . Mitral regurgitation   . Morbid obesity ([place])   . Orthostatic hypotension   . Overweight(278.02)   . Renal insufficiency   . 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 Severe aortic insufficiency   . Sinus tachycardia   . Syncope     positional after dialysis... 2008  ROS:   All systems reviewed and negative except as noted in the HPI.        Past Surgical History:  Procedure Laterality Date  . A/V FISTULAGRAM Left 06/30/2016   Procedure: A/V Fistulagram;  Surgeon: Algernon Huxley, MD;  Location: [place] [facility];  Service: Cardiovascular;  Laterality: Left;  . AORTIC VALVE REPLACEMENT N/A 09/01/2017   Procedure: AORTIC VALVE REPLACEMENT (AVR);  Surgeon: [name], MD;  Location: [place];  Service: Open Heart Surgery;  Laterality: N/A;  . ARTERIOVENOUS GRAFT PLACEMENT    . AV FISTULA PLACEMENT    . AV FISTULA PLACEMENT Left 11/18/2015   Procedure: ARTERIOVENOUS (AV) FISTULA CREATION ( RADIOCEPHALIC );  Surgeon: Algernon Huxley, MD;  Location: [facility];  Service: Vascular;  Laterality: Left;  . AV FISTULA PLACEMENT Left 03/23/2016     Procedure: ARTERIOVENOUS (AV) FISTULA CREATION ( REVISION );  Surgeon: Algernon Huxley, MD;  Location: [facility];  Service: Vascular;  Laterality: Left;  . [place] REMOVAL Left 03/23/2016   Procedure: REMOVAL OF ARTERIOVENOUS GORETEX GRAFT ([place]);  Surgeon: Algernon Huxley, MD;  Location: [facility];  Service: Vascular;  Laterality: Left;  . CENTRAL LINE INSERTION Right 09/01/2017   Procedure: FLOROSCOPY GUIDED PLACEMENT OF RIGHT FEMORAL CENTRAL LINE TIMES TWO  AND PLACEMENT OF SWAN GANZ CATHETER;  Surgeon: [name], MD;  Location: [place];  Service: Open Heart Surgery;  Laterality: Right;  . CORONARY ARTERY BYPASS GRAFT N/A 09/01/2017   Procedure: CORONARY ARTERY BYPASS GRAFTING (CABG) X 1, USING RIGHT GREATER SAPHENOUS VEIN HARVESTED ENDOSCOPICALLY;  Surgeon: [name], MD;  Location: [place];  Service: Open Heart Surgery;  Laterality: N/A;  . DIALYSIS/PERMA CATHETER REMOVAL N/A 08/01/2016   Procedure: Dialysis/Perma Catheter Removal;  Surgeon: Algernon Huxley, MD;  Location: [place] [facility];  Service: Cardiovascular;  Laterality: N/A;  . HERNIA REPAIR    . INSERTION OF DIALYSIS CATHETER  03/01/2011   Procedure: INSERTION OF DIALYSIS CATHETER;  Surgeon: Elam Dutch, MD;  Location: [place];  Service: Vascular;  Laterality: Left;  Exchange of Dialysis Catheter to 27cm 15Fr. Arrow Catheter  . INSERTION OF DIALYSIS CATHETER  09/01/2017   Procedure: PLACEMENT OF TRIALYSIS SHORT TERM DIALYSIS CATHETER;  Surgeon: [name], MD;  Location: [place] [facility];  Service: Open Heart Surgery;;  . MULTIPLE EXTRACTIONS WITH ALVEOLOPLASTY N/A 08/07/2017   Procedure: Extraction of tooth #2, 7,8,13,14,15,17, and 31 with alveoloplasty and gross debridement of remaining teeth;  Surgeon: [name], DDS;  Location: [place];  Service: Oral Surgery;  Laterality: N/A;  . PERIPHERAL VASCULAR CATHETERIZATION N/A 09/01/2014   Procedure: A/V Shuntogram/Fistulagram;  Surgeon: Algernon Huxley, MD;  Location: [place] [facility];  Service: Cardiovascular;  Laterality: N/A;  . PERIPHERAL VASCULAR CATHETERIZATION Right 09/01/2014   Procedure: Thrombectomy;  Surgeon: Algernon Huxley, MD;  Location: [place] [facility];  Service: Cardiovascular;  Laterality: Right;  . PERIPHERAL VASCULAR CATHETERIZATION Right 09/01/2014   Procedure: A/V Shunt Intervention;  Surgeon: Algernon Huxley, MD;  Location: [place] [facility];  Service: Cardiovascular;  Laterality: Right;  . PERIPHERAL VASCULAR CATHETERIZATION N/A 09/17/2014   Procedure: A/V Shuntogram/Fistulagram;  Surgeon: Algernon Huxley, MD;  Location: [place] [facility];  Service: Cardiovascular;  Laterality: N/A;  . PERIPHERAL VASCULAR CATHETERIZATION Right 10/17/2014   Procedure: Thrombectomy;  Surgeon: Katha Cabal, MD;  Location: [place] [facility];  Service: Cardiovascular;  Laterality: Right;  . PERIPHERAL VASCULAR CATHETERIZATION N/A  10/17/2014   Procedure: A/V Shuntogram/Fistulagram;  Surgeon: Katha Cabal, MD;  Location: [place] [facility];  Service: Cardiovascular;  Laterality: N/A;  . PERIPHERAL VASCULAR CATHETERIZATION N/A 10/17/2014   Procedure: A/V Shunt Intervention;  Surgeon: Katha Cabal, MD;  Location: [place] [facility];  Service: Cardiovascular;  Laterality: N/A;  . PERIPHERAL VASCULAR CATHETERIZATION Right 11/04/2014   Procedure: Thrombectomy;  Surgeon: Katha Cabal, MD;  Location: [place] [facility];  Service: Cardiovascular;  Laterality: Right;  . PERIPHERAL VASCULAR CATHETERIZATION Left 11/04/2014   Procedure: Visceral Venography;  Surgeon: Katha Cabal, MD;  Location: [place] [facility];  Service: Cardiovascular;  Laterality: Left;  . PERIPHERAL VASCULAR CATHETERIZATION Right 11/27/2014   Procedure: A/V Shuntogram/Fistulagram;  Surgeon: Algernon Huxley, MD;  Location: [place] [facility];  Service: Cardiovascular;  Laterality: Right;  . PERIPHERAL VASCULAR CATHETERIZATION Right 11/27/2014   Procedure: A/V Shunt  Intervention;  Surgeon: Algernon Huxley, MD;  Location: [place] [facility];  Service: Cardiovascular;  Laterality: Right;  . PERIPHERAL VASCULAR CATHETERIZATION Right 12/31/2014   Procedure: Thrombectomy;  Surgeon: Algernon Huxley, MD;  Location: [place] [facility];  Service: Cardiovascular;  Laterality: Right;  . PERIPHERAL VASCULAR CATHETERIZATION Right 01/12/2015   Procedure: A/V Shuntogram/Fistulagram;  Surgeon: Algernon Huxley, MD;  Location: [place] [facility];  Service: Cardiovascular;  Laterality: Right;  . PERIPHERAL VASCULAR CATHETERIZATION N/A 01/12/2015   Procedure: A/V Shunt Intervention;  Surgeon: Algernon Huxley, MD;  Location: [place] [facility];  Service: Cardiovascular;  Laterality: N/A;  . PERIPHERAL VASCULAR CATHETERIZATION Right 01/28/2015   Procedure: Thrombectomy;  Surgeon: Algernon Huxley, MD;  Location: [place] [facility];  Service: Cardiovascular;  Laterality: Right;  . PERIPHERAL VASCULAR CATHETERIZATION N/A 06/08/2015   Procedure: A/V Shuntogram/Fistulagram;  Surgeon: Algernon Huxley, MD;  Location: [place] [facility];  Service: Cardiovascular;  Laterality: N/A;  . PERIPHERAL VASCULAR CATHETERIZATION N/A 06/08/2015   Procedure: A/V Shunt Intervention;  Surgeon: Algernon Huxley, MD;  Location: [place] [facility];  Service: Cardiovascular;  Laterality: N/A;  . PERIPHERAL VASCULAR CATHETERIZATION Right 07/06/2015   Procedure: A/V Shuntogram/Fistulagram;  Surgeon: Algernon Huxley, MD;  Location: [place] [facility];  Service: Cardiovascular;  Laterality: Right;  . PERIPHERAL VASCULAR CATHETERIZATION N/A 07/06/2015   Procedure: A/V Shunt Intervention;  Surgeon: Algernon Huxley, MD;  Location: [place] [facility];  Service: Cardiovascular;  Laterality: N/A;  . PERIPHERAL VASCULAR CATHETERIZATION N/A 08/04/2015   Procedure: graft declot;  Surgeon: Katha Cabal, MD;  Location: [place] [facility];  Service: Cardiovascular;  Laterality: N/A;  . PERIPHERAL VASCULAR CATHETERIZATION Right  08/25/2015   Procedure: A/V Shuntogram/Fistulagram;  Surgeon: Katha Cabal, MD;  Location: [place] [facility];  Service: Cardiovascular;  Laterality: Right;  . PERIPHERAL VASCULAR CATHETERIZATION N/A 11/04/2015   Procedure: Dialysis/Perma Catheter Insertion;  Surgeon: Algernon Huxley, MD;  Location: [place] [facility];  Service: Cardiovascular;  Laterality: N/A;  . PERIPHERAL VASCULAR CATHETERIZATION Left 12/14/2015   Procedure: A/V Shuntogram/Fistulagram;  Surgeon: Algernon Huxley, MD;  Location: [place] [facility];  Service: Cardiovascular;  Laterality: Left;  . PERIPHERAL VASCULAR CATHETERIZATION N/A 12/14/2015   Procedure: A/V Shunt Intervention;  Surgeon: Algernon Huxley, MD;  Location: [place] [facility];  Service: Cardiovascular;  Laterality: N/A;  . PERIPHERAL VASCULAR CATHETERIZATION N/A 12/17/2015   Procedure: Dialysis/Perma Catheter Insertion;  Surgeon: Algernon Huxley, MD;  Location: [place] [facility];  Service: Cardiovascular;  Laterality:  N/A;  . PERIPHERAL VASCULAR CATHETERIZATION Left 12/22/2015   Procedure: Dialysis/Perma Catheter Insertion;  Surgeon: Katha Cabal, MD;  Location: [place] [facility];  Service: Cardiovascular;  Laterality: Left;  . PERIPHERAL VASCULAR CATHETERIZATION Left 02/29/2016   Procedure: A/V Shuntogram/Fistulagram;  Surgeon: Algernon Huxley, MD;  Location: [place] [facility];  Service: Cardiovascular;  Laterality: Left;  . PERIPHERAL VASCULAR CATHETERIZATION N/A 02/29/2016   Procedure: A/V Shunt Intervention;  Surgeon: Algernon Huxley, MD;  Location: [place] [facility];  Service: Cardiovascular;  Laterality: N/A;  . POCKET REVISION/RELOCATION N/A 01/01/2018   Procedure: POCKET REVISION/RELOCATION;  Surgeon: Evans Lance, MD;  Location: [place] [facility];  Service: Cardiovascular;  Laterality: N/A;  . right arm graft     for dyalisis  . RIGHT/LEFT HEART CATH AND CORONARY ANGIOGRAPHY N/A 08/02/2017   Procedure: RIGHT/LEFT HEART CATH AND CORONARY  ANGIOGRAPHY;  Surgeon: Larey Dresser, MD;  Location: [place] [facility];  Service: Cardiovascular;  Laterality: N/A;  . SUBQ ICD IMPLANT N/A 11/28/2017   Procedure: SUBQ ICD IMPLANT;  Surgeon: Evans Lance, MD;  Location: [place] [facility];  Service: Cardiovascular;  Laterality: N/A;  . TEE WITHOUT CARDIOVERSION N/A 07/31/2017   Procedure: TRANSESOPHAGEAL ECHOCARDIOGRAM (TEE);  Surgeon: Josue Hector, MD;  Location: [place] [facility];  Service: Cardiovascular;  Laterality: N/A;  . TEE WITHOUT CARDIOVERSION N/A 09/01/2017   Procedure: TRANSESOPHAGEAL ECHOCARDIOGRAM (TEE);  Surgeon: [name], MD;  Location: [place];  Service: Open Heart Surgery;  Laterality: N/A;  . THROMBECTOMY    . THROMBECTOMY W/ EMBOLECTOMY  03/01/2011   Procedure: THROMBECTOMY ARTERIOVENOUS GORE-TEX GRAFT;  Surgeon: Elam Dutch, MD;  Location: [place];  Service: Vascular;  Laterality: Right;  Attempted Thrombectomy of Old  Right Upper Arm Arteriovenous gortex Graft. Insertion of new Arteriovenous Graft using 20mm x 50cm Gortex Stretch graft.   . THROMBECTOMY W/ EMBOLECTOMY  07/11/2011   Procedure: THROMBECTOMY ARTERIOVENOUS GORE-TEX GRAFT;  Surgeon: Rosetta Posner, MD;  Location: [place];  Service: Vascular;  Laterality: Right;  . UMBILICAL HERNIA REPAIR    . VENOGRAM N/A 08/23/2011   Procedure: VENOGRAM;  Surgeon: [name], MD;  [facility];  Service: Cardiovascular;  Laterality: N/A;          Family History  Problem Relation Age of Onset  . Hypertension Mother   . Heart disease Mother   . Hypertension Father   . Heart disease Father      Social History        Socioeconomic History  . Marital status: Married    Spouse name: Not on file  . Number of children: Not on file  . Years of education: Not on file  . Highest education level: Not on file  Occupational History  . Occupation: disabled  Social Needs  . Financial resource strain: Not on file  . Food  insecurity:    Worry: Not on file    Inability: Not on file  . Transportation needs:    Medical: Not on file    Non-medical: Not on file  Tobacco Use  . Smoking status: Former Smoker    Types: Cigarettes    Last attempt to quit: 04/18/1994    Years since quitting: 23.7  . Smokeless tobacco: Current User    Types: Snuff  . Tobacco comment: dips 1/2 snuff per day times 25 years  Substance and Sexual Activity  . Alcohol use: No  . Drug use: No  .  Sexual activity: Not on file  Lifestyle  . Physical activity:    Days per week: Not on file    Minutes per session: Not on file  . Stress: Not on file  Relationships  . Social connections:    Talks on phone: Not on file    Gets together: Not on file    Attends religious service: Not on file    Active member of club or organization: Not on file    Attends meetings of clubs or organizations: Not on file    Relationship status: Not on file  . Intimate partner violence:    Fear of current or ex partner: Not on file    Emotionally abused: Not on file    Physically abused: Not on file    Forced sexual activity: Not on file  Other Topics Concern  . Not on file  Social History Narrative  . Not on file     BP 138/84   Pulse 75   Ht 6' (1.829 m)   Wt (!) 304 lb (137.9 kg)   SpO2 90%   BMI 41.23 kg/m   Physical Exam:  Morbidly obes appearing NAD HEENT: Unremarkable Neck:  7 cm JVD, no thyromegally Lymphatics:  No adenopathy Back:  No CVA tenderness Lungs:  Clear with no wheezes HEART:  Regular rate rhythm, no murmurs, no rubs, no clicks Abd:  soft, positive bowel sounds, no organomegally, no rebound, no guarding; his pocket has swollen again. Ext:  2 plus pulses, no edema, no cyanosis, no clubbing Skin:  No rashes no nodules Neuro:  CN II through XII intact, motor grossly intact  EKG - none  DEVICE  Not checked today  Assess/Plan: 1. ICD pocket hematoma - he is s/p  evacuation. He will return next week to see how his pocket is doing. The combination of ESRD, chronic systemic anti-coagulation and his other comorbidities including massive obesity, markedly increase his risk of a pocket infection.  2. CAD - he denies anginal symptom 3. Incisional pain - we will prescribe some Tylenol #3.   Ponciano Ort  EP Attending  Patient seen and examined. Since his last clinic visit, the patient has been back to our office on a number of occaisions and continues to drain clear serous fluid from his ICD incision. He is not febrile and has no signs of systemic infection. However, I suspect he has an indolent infection, most likely involving staph epi. I have discussed the indications,risks/benefits/goals/expectations of ICD system removal and he wishes to proceed.  Mikle Bosworth.D.

## 2018-02-13 ENCOUNTER — Encounter (HOSPITAL_COMMUNITY): Payer: Self-pay | Admitting: Internal Medicine

## 2018-02-13 ENCOUNTER — Other Ambulatory Visit: Payer: Self-pay

## 2018-02-13 DIAGNOSIS — I132 Hypertensive heart and chronic kidney disease with heart failure and with stage 5 chronic kidney disease, or end stage renal disease: Secondary | ICD-10-CM | POA: Diagnosis not present

## 2018-02-13 DIAGNOSIS — B999 Unspecified infectious disease: Secondary | ICD-10-CM | POA: Diagnosis not present

## 2018-02-13 DIAGNOSIS — I97638 Postprocedural hematoma of a circulatory system organ or structure following other circulatory system procedure: Secondary | ICD-10-CM | POA: Diagnosis not present

## 2018-02-13 DIAGNOSIS — N186 End stage renal disease: Secondary | ICD-10-CM | POA: Diagnosis not present

## 2018-02-13 DIAGNOSIS — T827XXS Infection and inflammatory reaction due to other cardiac and vascular devices, implants and grafts, sequela: Secondary | ICD-10-CM

## 2018-02-13 DIAGNOSIS — T827XXA Infection and inflammatory reaction due to other cardiac and vascular devices, implants and grafts, initial encounter: Secondary | ICD-10-CM | POA: Diagnosis not present

## 2018-02-13 DIAGNOSIS — I5022 Chronic systolic (congestive) heart failure: Secondary | ICD-10-CM | POA: Diagnosis not present

## 2018-02-13 LAB — PROTIME-INR
INR: 1.69
Prothrombin Time: 19.7 seconds — ABNORMAL HIGH (ref 11.4–15.2)

## 2018-02-13 NOTE — Discharge Summary (Addendum)
ELECTROPHYSIOLOGY PROCEDURE DISCHARGE SUMMARY    Patient ID: [name] [name],  MRN: [id number], DOB/AGE: 12/11/1965 52 y.o.  Admit date: 02/12/2018 Discharge date: 02/13/2018  Primary Care Physician: Patient, No Pcp Per  Primary Cardiologist: Dr. Bronson Ing Electrophysiologist: Dr. Lovena Le  Primary Discharge Diagnosis:  1. mixed, CM 2. S-ICD pocket hematoma w/suspect subsequent pocket infection  Secondary Discharge Diagnosis:  1. VHD w/mechanical AVR 2. ESRF on HD     Next is tomorrow 3. CAD     H/o CABG 4. Chronic CHF     Appears compensated at this time  No Known Allergies   Procedures This Admission:  1. S-ICD system removal 02/12/18 Conclusion: Successful removal of a previously implanted subcutaneous ICD which developed an ICD pocket infection  Brief HPI: SALIL RAINERI is a 52 y.o. male with past medical history including above.  Underwent S-ICD implant August 2019, developed subsequent sever pocket hematoma requiring evacuation. After this developed site drainage, and suspect pocket infection.  Recommended to have device/system removed.  Potential risks/benefots discussed with the patient, he was agreeable to continue.  Hospital Course:  The patient was admitted and underwent removal of his ICD yesterday by dr. Lovena Le, please see full procedure report for details. He was monitored on telemetry overnight which demonstrated SR.  Dressing and packing removed by Dr. Lovena Le, patient tolerated well.  Clean dressing applied, not pressure, Dr. Lovena Le instructed patient/wife on how to change dressing should it become saturated. Slight oozing from superior sternal wound, clean dressing applied here as well.  The device was interrogated and found to be functioning normally.  The patient denies any CP or SOB, minimal site discomfort, he reports he has the pain medicine previously prescribed still at home.  Narcotic safety was reviewed.  The patient was examined by Dr. Lovena Le and  considered stable for discharge to home.  Early follow up for Friday wound check and coumadin clinic visit are in place. Resume his usual warfarin regime.  Physical Exam: Vitals:   02/13/18 0400 02/13/18 0600 02/13/18 0758 02/13/18 0800  BP: 104/70 116/70 110/74 110/74  Pulse: 86 85  88  Resp: (!) 22 (!) 23  (!) 26  Temp: 97.7 F (36.5 C)  98 F (36.7 C)   TempSrc: Oral  Oral   SpO2: 94% 97%  97%  Weight:      Height:        GEN- The patient is well appearing, alert and oriented x 3 today.   HEENT: normocephalic, atraumatic; sclera clear, conjunctiva pink; hearing intact; oropharynx clear Lungs- CTA b/l, normal work of breathing.  No wheezes, rales, rhonchi Heart- RRR, no murmurs, rubs or gallops, PMI not laterally displaced GI- soft, non-tender, non-distended, obese Extremities- no clubbing, cyanosis, or edema MS- no significant deformity or atrophy Skin- warm and dry, no rash or lesion Psych- euthymic mood, full affect Neuro- no gross deficits  Sternal wound with sutures in place, slight site oozing resolved with slight pressure superior wound, inferior wound dry.  Superficial skin tears noted  Left lateral site, sutures intact, packing removed, no active bleeding  Labs:   Lab Results  Component Value Date   WBC 8.4 02/09/2018   HGB 10.9 (L) 02/09/2018   HCT 35.8 (L) 02/09/2018   MCV 80 02/09/2018   PLT 260 02/09/2018    Recent Labs  Lab 02/09/18 1011  NA 141  K 3.9  CL 96  CO2 25  BUN 40*  CREATININE 7.23*  CALCIUM 9.0  GLUCOSE 75  Discharge Medications:  Allergies as of 02/13/2018   No Known Allergies     Medication List    TAKE these medications   acetaminophen-codeine 300-30 MG tablet Commonly known as:  TYLENOL #3 Take 1-2 tablets by mouth every 6 (six) hours as needed for moderate pain. Notes to patient:  Do not use with any other pain medicine   amiodarone 200 MG tablet Commonly known as:  PACERONE Take 1 tablet (200 mg total) by mouth  daily.   aspirin 81 MG EC tablet Take 1 tablet (81 mg total) by mouth daily.   atorvastatin 80 MG tablet Commonly known as:  LIPITOR Take 1 tablet (80 mg total) by mouth daily at 6 PM.   FOSRENOL 500 MG chewable tablet Generic drug:  lanthanum Chew 1,500-2,000 mg by mouth See admin instructions. Take 2000 mg by mouth with meals and take 1500 mg by mouth with snacks   metoprolol tartrate 25 MG tablet Commonly known as:  LOPRESSOR Take 1 tablet (25 mg total) by mouth 2 (two) times daily.   midodrine 5 MG tablet Commonly known as:  PROAMATINE Take 3 tablets (15 mg total) by mouth 3 (three) times daily with meals.   NEPHRO-VITE PO Take 1 tablet by mouth daily.   oxyCODONE 5 MG immediate release tablet Commonly known as:  Oxy IR/ROXICODONE Take 1 tablet (5 mg total) by mouth every 4 (four) hours as needed for severe pain. Notes to patient:  Do not use with any other pain medicine   warfarin 5 MG tablet Commonly known as:  COUMADIN Take as directed. If you are unsure how to take this medication, talk to your nurse or doctor. Original instructions:  Take 1 tablet daily except 1/2 tablet on Tuesdays, Thursdays and Saturdays or as directed by coumadin clinic What changed:    how much to take  how to take this  when to take this  additional instructions       Disposition: Home  Discharge Instructions    Diet - low sodium heart healthy   Complete by:  As directed    Increase activity slowly   Complete by:  As directed      Follow-up Information    [facility] Follow up.   Specialty:  Cardiology Why:  02/16/18 3:30PM, coumadin clinic 4:00PM, wound check visit  Contact information: [address], [place] [phone]          Duration of Discharge Encounter: Greater than 30 minutes including physician time.  Venetia Night, PA-C 02/13/2018 11:12 AM  EP Attending  Patient seen and  examined. Agree with above. He is s/p removal of his S-ICD do to pocket infection. He has undergone extensive debridement of his wound. He is stable for DC with followup as noted above. He will return at the end of the week for incision check and INR check.  Mikle Bosworth.D.

## 2018-02-13 NOTE — Discharge Instructions (Signed)
Remove and replace dressings as needed when saturated/dirty as shown/discussed with Dr. Lovena Le

## 2018-02-13 NOTE — Progress Notes (Signed)
Patient ambulated in hall with nurse tech.  Upon returning to room, small amount of bleeding noted from left lower dressing.  Returned patient to bed, notified Tommye Standard, kept on bedrest until seen by EP.  Dressing remained intact, left in place per EP instructions.  No further bleeding noted.

## 2018-02-14 DIAGNOSIS — Z992 Dependence on renal dialysis: Secondary | ICD-10-CM | POA: Diagnosis not present

## 2018-02-14 DIAGNOSIS — N2581 Secondary hyperparathyroidism of renal origin: Secondary | ICD-10-CM | POA: Diagnosis not present

## 2018-02-14 DIAGNOSIS — D509 Iron deficiency anemia, unspecified: Secondary | ICD-10-CM | POA: Diagnosis not present

## 2018-02-14 DIAGNOSIS — N186 End stage renal disease: Secondary | ICD-10-CM | POA: Diagnosis not present

## 2018-02-14 DIAGNOSIS — Z23 Encounter for immunization: Secondary | ICD-10-CM | POA: Diagnosis not present

## 2018-02-14 DIAGNOSIS — E611 Iron deficiency: Secondary | ICD-10-CM | POA: Diagnosis not present

## 2018-02-15 DIAGNOSIS — E611 Iron deficiency: Secondary | ICD-10-CM | POA: Diagnosis not present

## 2018-02-15 DIAGNOSIS — Z992 Dependence on renal dialysis: Secondary | ICD-10-CM | POA: Diagnosis not present

## 2018-02-15 DIAGNOSIS — N2581 Secondary hyperparathyroidism of renal origin: Secondary | ICD-10-CM | POA: Diagnosis not present

## 2018-02-15 DIAGNOSIS — D509 Iron deficiency anemia, unspecified: Secondary | ICD-10-CM | POA: Diagnosis not present

## 2018-02-15 DIAGNOSIS — N186 End stage renal disease: Secondary | ICD-10-CM | POA: Diagnosis not present

## 2018-02-15 DIAGNOSIS — Z23 Encounter for immunization: Secondary | ICD-10-CM | POA: Diagnosis not present

## 2018-02-16 ENCOUNTER — Ambulatory Visit (INDEPENDENT_AMBULATORY_CARE_PROVIDER_SITE_OTHER): Payer: Medicare Other

## 2018-02-16 ENCOUNTER — Telehealth: Payer: Self-pay | Admitting: *Deleted

## 2018-02-16 ENCOUNTER — Ambulatory Visit (INDEPENDENT_AMBULATORY_CARE_PROVIDER_SITE_OTHER): Payer: Medicare Other | Admitting: *Deleted

## 2018-02-16 DIAGNOSIS — Z5181 Encounter for therapeutic drug level monitoring: Secondary | ICD-10-CM

## 2018-02-16 DIAGNOSIS — T82868D Thrombosis of vascular prosthetic devices, implants and grafts, subsequent encounter: Secondary | ICD-10-CM

## 2018-02-16 DIAGNOSIS — Z954 Presence of other heart-valve replacement: Secondary | ICD-10-CM

## 2018-02-16 DIAGNOSIS — I255 Ischemic cardiomyopathy: Secondary | ICD-10-CM

## 2018-02-16 LAB — POCT INR: INR: 2 (ref 2.0–3.0)

## 2018-02-16 NOTE — Telephone Encounter (Signed)
error 

## 2018-02-16 NOTE — Patient Instructions (Signed)
Your physician recommends that you schedule a follow-up appointment with the [facility] on 02/23/18 @ 4:00pm.

## 2018-02-16 NOTE — Progress Notes (Signed)
Patient presents to the office for a wound check s/p SICD extraction on 02/12/18. Wounds are healing well without redness or significant edema. Sutures remain in place for all incisions. Some serosanguineous drainage noted on the dressing covering the former SICD pocket. Dr.Taylor assessed wounds and recommended redressing the pocket site with gauze and Tegaderm, and to f/u in 1 week to remove the sternal sutures. Appt scheduled for 02/23/18 @ 1600. Patient verbalized understanding.

## 2018-02-16 NOTE — Patient Instructions (Signed)
Description   Continue on same dosage 5mg  daily. Recheck in 2 weeks.

## 2018-02-17 DIAGNOSIS — N186 End stage renal disease: Secondary | ICD-10-CM | POA: Diagnosis not present

## 2018-02-17 DIAGNOSIS — Z992 Dependence on renal dialysis: Secondary | ICD-10-CM | POA: Diagnosis not present

## 2018-02-17 DIAGNOSIS — E611 Iron deficiency: Secondary | ICD-10-CM | POA: Diagnosis not present

## 2018-02-17 DIAGNOSIS — N2581 Secondary hyperparathyroidism of renal origin: Secondary | ICD-10-CM | POA: Diagnosis not present

## 2018-02-20 DIAGNOSIS — Z992 Dependence on renal dialysis: Secondary | ICD-10-CM | POA: Diagnosis not present

## 2018-02-20 DIAGNOSIS — E611 Iron deficiency: Secondary | ICD-10-CM | POA: Diagnosis not present

## 2018-02-20 DIAGNOSIS — N186 End stage renal disease: Secondary | ICD-10-CM | POA: Diagnosis not present

## 2018-02-20 DIAGNOSIS — N2581 Secondary hyperparathyroidism of renal origin: Secondary | ICD-10-CM | POA: Diagnosis not present

## 2018-02-21 ENCOUNTER — Ambulatory Visit (INDEPENDENT_AMBULATORY_CARE_PROVIDER_SITE_OTHER): Payer: Medicare Other | Admitting: Cardiovascular Disease

## 2018-02-21 ENCOUNTER — Encounter: Payer: Self-pay | Admitting: Cardiovascular Disease

## 2018-02-21 VITALS — BP 124/74 | HR 90 | Ht 72.0 in | Wt 311.0 lb

## 2018-02-21 DIAGNOSIS — I25708 Atherosclerosis of coronary artery bypass graft(s), unspecified, with other forms of angina pectoris: Secondary | ICD-10-CM | POA: Diagnosis not present

## 2018-02-21 DIAGNOSIS — I5022 Chronic systolic (congestive) heart failure: Secondary | ICD-10-CM | POA: Diagnosis not present

## 2018-02-21 DIAGNOSIS — Z992 Dependence on renal dialysis: Secondary | ICD-10-CM | POA: Diagnosis not present

## 2018-02-21 DIAGNOSIS — I471 Supraventricular tachycardia: Secondary | ICD-10-CM

## 2018-02-21 DIAGNOSIS — I480 Paroxysmal atrial fibrillation: Secondary | ICD-10-CM

## 2018-02-21 DIAGNOSIS — N186 End stage renal disease: Secondary | ICD-10-CM

## 2018-02-21 DIAGNOSIS — Z954 Presence of other heart-valve replacement: Secondary | ICD-10-CM | POA: Diagnosis not present

## 2018-02-21 DIAGNOSIS — I38 Endocarditis, valve unspecified: Secondary | ICD-10-CM | POA: Diagnosis not present

## 2018-02-21 DIAGNOSIS — I255 Ischemic cardiomyopathy: Secondary | ICD-10-CM

## 2018-02-21 DIAGNOSIS — I429 Cardiomyopathy, unspecified: Secondary | ICD-10-CM | POA: Diagnosis not present

## 2018-02-21 NOTE — Progress Notes (Signed)
SUBJECTIVE: The patient presents for routine follow-up.  He underwent removal of a subcutaneous ICD which had developed a pocket infection in October 2019.  It was initially implanted in August 2019 but he developed a severe pocket hematoma requiring evacuation.  Echocardiogram on 10/25/2017 showed EF 30% with moderate left ventricular dilatation, mildly decreased right ventricular systolic function, normal mechanical aortic valve function, and mild mitral regurgitation.  He underwent mechanical bileaflet aortic valve replacement and single-vessel CABG on 09/01/2017.   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  He has orthostatic hypotension and end-stage renal disease on hemodialysis.  The patient denies any symptoms of chest pain, palpitations, shortness of breath, lightheadedness, dizziness, leg swelling, orthopnea, PND, and syncope.  They are expecting a lot of people over for Thanksgiving.  His wife does all the cooking.    Review of Systems: As per "subjective", otherwise negative.  No Known Allergies  Current Outpatient Medications  Medication Sig Dispense Refill  . acetaminophen-codeine (TYLENOL #3) 300-30 MG tablet Take 1-2 tablets by mouth every 6 (six) hours as needed for moderate pain. 20 tablet 0  . amiodarone (PACERONE) 200 MG tablet Take 1 tablet (200 mg total) by mouth daily. 90 tablet 3  . aspirin EC 81 MG EC tablet Take 1 tablet (81 mg total) by mouth daily.    [name] atorvastatin (LIPITOR) 80 MG tablet Take 1 tablet (80 mg total) by mouth daily at 6 PM. 90 tablet 3  . B Complex-C-Folic Acid (NEPHRO-VITE PO) Take 1 tablet by mouth daily.     [name] FOSRENOL 500 MG chewable tablet Chew 1,500-2,000 mg by mouth See admin instructions. Take 2000 mg by mouth with meals and take 1500 mg by mouth with snacks    . metoprolol tartrate (LOPRESSOR) 25 MG tablet Take 1 tablet (25 mg total) by mouth 2 (two) times daily. 60 tablet 3  . midodrine (PROAMATINE) 5 MG tablet Take  3 tablets (15 mg total) by mouth 3 (three) times daily with meals. 810 tablet 3  . oxyCODONE (OXY IR/ROXICODONE) 5 MG immediate release tablet Take 1 tablet (5 mg total) by mouth every 4 (four) hours as needed for severe pain. 30 tablet 0  . warfarin (COUMADIN) 5 MG tablet Take 1 tablet daily except 1/2 tablet on Tuesdays, Thursdays and Saturdays or as directed by coumadin clinic (Patient taking differently: Take 5 mg by mouth daily. ) 90 tablet 1   No current facility-administered medications for this visit.     Past Medical History:  Diagnosis Date  . Aneurysm ([place])     Right arm fistula 3 aneurysms 2011,   plans to have a new procedure  . Angina   . Chest discomfort   . CHF (congestive heart failure) ([place])   . Coronary artery disease   . Coronary artery disease involving native coronary artery of native heart without angina pectoris   . Dialysis patient ([place])   . Dysrhythmia   . ESRD (end stage renal disease) ([place])    on hemodialysis [id number]_[id number]  . Hypertension   . Leg pain   . Mitral regurgitation   . Morbid obesity ([place])   . Orthostatic hypotension   . Overweight(278.02)   . Renal insufficiency   . 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 Severe aortic insufficiency   . Sinus tachycardia   .  Syncope     positional after dialysis... 2008    Past Surgical History:  Procedure Laterality Date  . A/V FISTULAGRAM Left 06/30/2016   Procedure: A/V Fistulagram;  Surgeon: Algernon Huxley, MD;  Location: [facility];  Service: Cardiovascular;  Laterality: Left;  . AORTIC VALVE REPLACEMENT N/A 09/01/2017   Procedure: AORTIC VALVE REPLACEMENT (AVR);  Surgeon: [name], MD;  Location: [facility];  Service: Open Heart Surgery;  Laterality: N/A;  . ARTERIOVENOUS GRAFT PLACEMENT    . AV FISTULA PLACEMENT    . AV FISTULA PLACEMENT Left 11/18/2015    Procedure: ARTERIOVENOUS (AV) FISTULA CREATION ( RADIOCEPHALIC );  Surgeon: Algernon Huxley, MD;  Location: [facility];  Service: Vascular;  Laterality: Left;  . AV FISTULA PLACEMENT Left 03/23/2016   Procedure: ARTERIOVENOUS (AV) FISTULA CREATION ( REVISION );  Surgeon: Algernon Huxley, MD;  Location: [facility];  Service: Vascular;  Laterality: Left;  . [place] REMOVAL Left 03/23/2016   Procedure: REMOVAL OF ARTERIOVENOUS GORETEX GRAFT ([place]);  Surgeon: Algernon Huxley, MD;  Location: [facility];  Service: Vascular;  Laterality: Left;  . CENTRAL LINE INSERTION Right 09/01/2017   Procedure: FLOROSCOPY GUIDED PLACEMENT OF RIGHT FEMORAL CENTRAL LINE TIMES TWO  AND PLACEMENT OF SWAN GANZ CATHETER;  Surgeon: [name], MD;  Location: [place];  Service: Open Heart Surgery;  Laterality: Right;  . CORONARY ARTERY BYPASS GRAFT N/A 09/01/2017   Procedure: CORONARY ARTERY BYPASS GRAFTING (CABG) X 1, USING RIGHT GREATER SAPHENOUS VEIN HARVESTED ENDOSCOPICALLY;  Surgeon: [name], MD;  Location: [place];  Service: Open Heart Surgery;  Laterality: N/A;  . DIALYSIS/PERMA CATHETER REMOVAL N/A 08/01/2016   Procedure: Dialysis/Perma Catheter Removal;  Surgeon: Algernon Huxley, MD;  Location: [facility];  Service: Cardiovascular;  Laterality: N/A;  . HERNIA REPAIR    . INSERTION OF DIALYSIS CATHETER  03/01/2011   Procedure: INSERTION OF DIALYSIS CATHETER;  Surgeon: Elam Dutch, MD;  Location: [place];  Service: Vascular;  Laterality: Left;  Exchange of Dialysis Catheter to 27cm 15Fr. Arrow Catheter  . INSERTION OF DIALYSIS CATHETER  09/01/2017   Procedure: PLACEMENT OF TRIALYSIS SHORT TERM DIALYSIS CATHETER;  Surgeon: [name], MD;  Location: [facility];  Service: Open Heart Surgery;;  . MULTIPLE EXTRACTIONS WITH ALVEOLOPLASTY N/A 08/07/2017   Procedure: Extraction of tooth #2, 7,8,13,14,15,17, and 31 with alveoloplasty and gross debridement of remaining teeth;  Surgeon: [name], DDS;  Location: [place];   Service: Oral Surgery;  Laterality: N/A;  . PERIPHERAL VASCULAR CATHETERIZATION N/A 09/01/2014   Procedure: A/V Shuntogram/Fistulagram;  Surgeon: Algernon Huxley, MD;  Location: [place] [facility];  Service: Cardiovascular;  Laterality: N/A;  . PERIPHERAL VASCULAR CATHETERIZATION Right 09/01/2014   Procedure: Thrombectomy;  Surgeon: Algernon Huxley, MD;  Location: [facility];  Service: Cardiovascular;  Laterality: Right;  . PERIPHERAL VASCULAR CATHETERIZATION Right 09/01/2014   Procedure: A/V Shunt Intervention;  Surgeon: Algernon Huxley, MD;  Location: [facility];  Service: Cardiovascular;  Laterality: Right;  . PERIPHERAL VASCULAR CATHETERIZATION N/A 09/17/2014   Procedure: A/V Shuntogram/Fistulagram;  Surgeon: Algernon Huxley, MD;  Location: [facility];  Service: Cardiovascular;  Laterality: N/A;  . PERIPHERAL VASCULAR CATHETERIZATION Right 10/17/2014   Procedure: Thrombectomy;  Surgeon: Katha Cabal, MD;  Location: [facility];  Service: Cardiovascular;  Laterality: Right;  . PERIPHERAL VASCULAR CATHETERIZATION N/A 10/17/2014   Procedure: A/V Shuntogram/Fistulagram;  Surgeon: Dolores Lory  Schnier, MD;  Location: [facility];  Service: Cardiovascular;  Laterality: N/A;  . PERIPHERAL VASCULAR CATHETERIZATION N/A 10/17/2014   Procedure: A/V Shunt Intervention;  Surgeon: Katha Cabal, MD;  Location: [place];  Service: Cardiovascular;  Laterality: N/A;  . PERIPHERAL VASCULAR CATHETERIZATION Right 11/04/2014   Procedure: Thrombectomy;  Surgeon: Katha Cabal, MD;  Location: [facility];  Service: Cardiovascular;  Laterality: Right;  . PERIPHERAL VASCULAR CATHETERIZATION Left 11/04/2014   Procedure: Visceral Venography;  Surgeon: Katha Cabal, MD;  Location: [place] [facility];  Service: Cardiovascular;  Laterality: Left;  . PERIPHERAL VASCULAR CATHETERIZATION Right 11/27/2014   Procedure: A/V Shuntogram/Fistulagram;  Surgeon: Algernon Huxley,  MD;  Location: [facility];  Service: Cardiovascular;  Laterality: Right;  . PERIPHERAL VASCULAR CATHETERIZATION Right 11/27/2014   Procedure: A/V Shunt Intervention;  Surgeon: Algernon Huxley, MD;  Location: [facility];  Service: Cardiovascular;  Laterality: Right;  . PERIPHERAL VASCULAR CATHETERIZATION Right 12/31/2014   Procedure: Thrombectomy;  Surgeon: Algernon Huxley, MD;  Location: [facility];  Service: Cardiovascular;  Laterality: Right;  . PERIPHERAL VASCULAR CATHETERIZATION Right 01/12/2015   Procedure: A/V Shuntogram/Fistulagram;  Surgeon: Algernon Huxley, MD;  Location: [facility];  Service: Cardiovascular;  Laterality: Right;  . PERIPHERAL VASCULAR CATHETERIZATION N/A 01/12/2015   Procedure: A/V Shunt Intervention;  Surgeon: Algernon Huxley, MD;  Location: [facility] [facility];  Service: Cardiovascular;  Laterality: N/A;  . PERIPHERAL VASCULAR CATHETERIZATION Right 01/28/2015   Procedure: Thrombectomy;  Surgeon: Algernon Huxley, MD;  Location: [facility];  Service: Cardiovascular;  Laterality: Right;  . PERIPHERAL VASCULAR CATHETERIZATION N/A 06/08/2015   Procedure: A/V Shuntogram/Fistulagram;  Surgeon: Algernon Huxley, MD;  Location: [facility];  Service: Cardiovascular;  Laterality: N/A;  . PERIPHERAL VASCULAR CATHETERIZATION N/A 06/08/2015   Procedure: A/V Shunt Intervention;  Surgeon: Algernon Huxley, MD;  Location: [facility];  Service: Cardiovascular;  Laterality: N/A;  . PERIPHERAL VASCULAR CATHETERIZATION Right 07/06/2015   Procedure: A/V Shuntogram/Fistulagram;  Surgeon: Algernon Huxley, MD;  Location: [facility];  Service: Cardiovascular;  Laterality: Right;  . PERIPHERAL VASCULAR CATHETERIZATION N/A 07/06/2015   Procedure: A/V Shunt Intervention;  Surgeon: Algernon Huxley, MD;  Location: [place];  Service: Cardiovascular;  Laterality: N/A;  . PERIPHERAL VASCULAR CATHETERIZATION N/A 08/04/2015   Procedure: graft declot;   Surgeon: Katha Cabal, MD;  Location: [place] [facility];  Service: Cardiovascular;  Laterality: N/A;  . PERIPHERAL VASCULAR CATHETERIZATION Right 08/25/2015   Procedure: A/V Shuntogram/Fistulagram;  Surgeon: Katha Cabal, MD;  Location: [facility];  Service: Cardiovascular;  Laterality: Right;  . PERIPHERAL VASCULAR CATHETERIZATION N/A 11/04/2015   Procedure: Dialysis/Perma Catheter Insertion;  Surgeon: Algernon Huxley, MD;  Location: [facility];  Service: Cardiovascular;  Laterality: N/A;  . PERIPHERAL VASCULAR CATHETERIZATION Left 12/14/2015   Procedure: A/V Shuntogram/Fistulagram;  Surgeon: Algernon Huxley, MD;  Location: [place];  Service: Cardiovascular;  Laterality: Left;  . PERIPHERAL VASCULAR CATHETERIZATION N/A 12/14/2015   Procedure: A/V Shunt Intervention;  Surgeon: Algernon Huxley, MD;  Location: [place] [facility];  Service: Cardiovascular;  Laterality: N/A;  . PERIPHERAL VASCULAR CATHETERIZATION N/A 12/17/2015   Procedure: Dialysis/Perma Catheter Insertion;  Surgeon: Algernon Huxley, MD;  Location: [facility];  Service: Cardiovascular;  Laterality: N/A;  . PERIPHERAL VASCULAR CATHETERIZATION Left 12/22/2015  Procedure: Dialysis/Perma Catheter Insertion;  Surgeon: Katha Cabal, MD;  Location: [facility];  Service: Cardiovascular;  Laterality: Left;  . PERIPHERAL VASCULAR CATHETERIZATION Left 02/29/2016   Procedure: A/V Shuntogram/Fistulagram;  Surgeon: Algernon Huxley, MD;  Location: [place] [facility];  Service: Cardiovascular;  Laterality: Left;  . PERIPHERAL VASCULAR CATHETERIZATION N/A 02/29/2016   Procedure: A/V Shunt Intervention;  Surgeon: Algernon Huxley, MD;  Location: [facility];  Service: Cardiovascular;  Laterality: N/A;  . POCKET REVISION/RELOCATION N/A 01/01/2018   Procedure: POCKET REVISION/RELOCATION;  Surgeon: Evans Lance, MD;  Location: [facility];  Service: Cardiovascular;  Laterality: N/A;  . right  arm graft     for dyalisis  . RIGHT/LEFT HEART CATH AND CORONARY ANGIOGRAPHY N/A 08/02/2017   Procedure: RIGHT/LEFT HEART CATH AND CORONARY ANGIOGRAPHY;  Surgeon: Larey Dresser, MD;  Location: [facility];  Service: Cardiovascular;  Laterality: N/A;  . SUBQ ICD IMPLANT N/A 11/28/2017   Procedure: SUBQ ICD IMPLANT;  Surgeon: Evans Lance, MD;  Location: [facility];  Service: Cardiovascular;  Laterality: N/A;  . SUBQ ICD REVISION N/A 02/12/2018   Procedure: SUBQ ICD REVISION(REMOVAL);  Surgeon: Evans Lance, MD;  Location: [facility];  Service: Cardiovascular;  Laterality: N/A;  . TEE WITHOUT CARDIOVERSION N/A 07/31/2017   Procedure: TRANSESOPHAGEAL ECHOCARDIOGRAM (TEE);  Surgeon: Josue Hector, MD;  Location[facility];  Service: Cardiovascular;  Laterality: N/A;  . TEE WITHOUT CARDIOVERSION N/A 09/01/2017   Procedure: TRANSESOPHAGEAL ECHOCARDIOGRAM (TEE);  Surgeon: [name], MD;  Location: [place];  Service: Open Heart Surgery;  Laterality: N/A;  . THROMBECTOMY    . THROMBECTOMY W/ EMBOLECTOMY  03/01/2011   Procedure: THROMBECTOMY ARTERIOVENOUS GORE-TEX GRAFT;  Surgeon: Elam Dutch, MD;  Location: [place];  Service: Vascular;  Laterality: Right;  Attempted Thrombectomy of Old  Right Upper Arm Arteriovenous gortex Graft. Insertion of new Arteriovenous Graft using 16mm x 50cm Gortex Stretch graft.   . THROMBECTOMY W/ EMBOLECTOMY  07/11/2011   Procedure: THROMBECTOMY ARTERIOVENOUS GORE-TEX GRAFT;  Surgeon: Rosetta Posner, MD;  Location: [place];  Service: Vascular;  Laterality: Right;  . UMBILICAL HERNIA REPAIR    . VENOGRAM N/A 08/23/2011   Procedure: VENOGRAM;  Surgeon: [name], MD;  Location: [facility];  Service: Cardiovascular;  Laterality: N/A;    Social History   Socioeconomic History  . Marital status: Married    Spouse name: Not on file  . Number of children: Not on file  . Years of education: Not on file  . Highest education level: Not on  file  Occupational History  . Occupation: disabled  Social Needs  . Financial resource strain: Not on file  . Food insecurity:    Worry: Not on file    Inability: Not on file  . Transportation needs:    Medical: Not on file    Non-medical: Not on file  Tobacco Use  . Smoking status: Former Smoker    Types: Cigarettes    Last attempt to quit: 04/18/1994    Years since quitting: 23.8  . Smokeless tobacco: Current User    Types: Snuff  . Tobacco comment: dips 1/2 snuff per day times 25 years  Substance and Sexual Activity  . Alcohol use: No  . Drug use: No  . Sexual activity: Not on file  Lifestyle  . Physical activity:    Days per week: Not on file    Minutes  per session: Not on file  . Stress: Not on file  Relationships  . Social connections:    Talks on phone: Not on file    Gets together: Not on file    Attends religious service: Not on file    Active member of club or organization: Not on file    Attends meetings of clubs or organizations: Not on file    Relationship status: Not on file  . Intimate partner violence:    Fear of current or ex partner: Not on file    Emotionally abused: Not on file    Physically abused: Not on file    Forced sexual activity: Not on file  Other Topics Concern  . Not on file  Social History Narrative  . Not on file     Vitals:   02/21/18 1556  BP: 124/74  Pulse: 90  SpO2: 95%  Weight: (!) 311 lb (141.1 kg)  Height: 6' (1.829 m)    Wt Readings from Last 3 Encounters:  02/21/18 (!) 311 lb (141.1 kg)  02/12/18 (!) 305 lb (138.3 kg)  01/13/18 (!) 305 lb (138.3 kg)     PHYSICAL EXAM General: NAD HEENT: Normal. Neck: No JVD, no thyromegaly. Lungs: Clear to auscultation bilaterally with normal respiratory effort. CV: Regular rate and rhythm, normal S1/mechanical S2, no H2/D9, 2/6 systolic murmur over right upper sternal border. No pretibial or periankle edema.  No carotid bruit.   Abdomen: Soft, nontender, no distention.    Neurologic: Alert and oriented.  Psych: Normal affect. Skin: Normal. Musculoskeletal: No gross deformities.    ECG: Reviewed above under Subjective   Labs: Lab Results  Component Value Date/Time   K 3.9 02/09/2018 10:11 AM   K 4.1 11/13/2013 04:08 AM   BUN 40 (H) 02/09/2018 10:11 AM   BUN 68 (H) 11/13/2013 04:08 AM   CREATININE 7.23 (H) 02/09/2018 10:11 AM   CREATININE 16.45 (H) 11/13/2013 04:08 AM   ALT 12 10/25/2017 11:14 AM   ALT < 6 (L) 05/16/2013 06:16 AM   TSH 1.819 10/25/2017 11:14 AM   HGB 10.9 (L) 02/09/2018 10:11 AM     Lipids: Lab Results  Component Value Date/Time   LDLCALC 118 (H) 10/25/2017 11:14 AM   LDLCALC 115 (H) 05/16/2013 06:16 AM   CHOL 180 10/25/2017 11:14 AM   CHOL 169 05/16/2013 06:16 AM   TRIG 89 10/25/2017 11:14 AM   TRIG 97 05/16/2013 06:16 AM   HDL 44 10/25/2017 11:14 AM   HDL 35 (L) 05/16/2013 06:16 AM       ASSESSMENT AND PLAN:  1. Chronic systolic heart failure/cardiomyopathy: Symptomatically stable.   Multifactorial in etiology. Volume management as per hemodialysis.  Due to complications from ICD implant including severe hematoma and pocket infection, it was removed in October 2019.  Continue Lopressor 25 mg twice daily.  2. Coronary artery disease status post one-vessel CABG: Symptomatically stable. Continue aspirin, Lopressor, and  atorvastatin 80 mg.   3. Paroxysmal atrial fibrillation: Symptomatically stable.  Currently on oral amiodarone and metoprolol and systemically anticoagulated with warfarin.  He will need routine TSH and LFT monitoring as well as lung function testing and eye exams.  4. Status post mechanical bileaflet aortic valve replacement: Currently on warfarin.   Aortic valve was functioning normally by echo on 10/25/2017.  Continue to keep INR between 2-3.  He will need antibiotic prophylaxis with dental work.  5. End-stage renal disease on hemodialysis. As hebecomes hypotensive, he is currently on  midodrine.  6. Paroxysmal supraventricular tachycardia: Symptomatically improved with the addition of metoprolol tartrate 25 mg twice daily on 09/26/2017.  He is also on amiodarone. I previously made an EP referral to consider an ablation as this appears to be recurrent and rapid.  7. Valvular heart disease: TEE on 09/01/2017 demonstrated severe aortic valve regurgitation for which he underwent aortic valve replacement. He also had moderate leaflet thickening and calcification with mild mitral annular calcification and moderate mitral regurgitation.  Follow-up echocardiogram on 10/25/2017 showed a normally functioning aortic valve.     Disposition: Follow up with me in 6 months   Kate Sable, M.D., F.A.C.C.

## 2018-02-21 NOTE — Patient Instructions (Signed)

## 2018-02-22 ENCOUNTER — Ambulatory Visit: Payer: Self-pay

## 2018-02-22 DIAGNOSIS — N186 End stage renal disease: Secondary | ICD-10-CM | POA: Diagnosis not present

## 2018-02-22 DIAGNOSIS — Z992 Dependence on renal dialysis: Secondary | ICD-10-CM | POA: Diagnosis not present

## 2018-02-22 DIAGNOSIS — N2581 Secondary hyperparathyroidism of renal origin: Secondary | ICD-10-CM | POA: Diagnosis not present

## 2018-02-22 DIAGNOSIS — E611 Iron deficiency: Secondary | ICD-10-CM | POA: Diagnosis not present

## 2018-02-23 ENCOUNTER — Ambulatory Visit (INDEPENDENT_AMBULATORY_CARE_PROVIDER_SITE_OTHER): Payer: Medicare Other | Admitting: *Deleted

## 2018-02-23 DIAGNOSIS — I5022 Chronic systolic (congestive) heart failure: Secondary | ICD-10-CM

## 2018-02-23 NOTE — Progress Notes (Signed)
[name] [name] presents to the [facility] today for suture removal and pocket evaluation s/o SICD explant by Dr. Lovena Le. Sternal sutures removed, scabbing noted along sternal line, no redness or swelling. Patient denies fever and chills. Dressing removed from left thorax, sutures intact, incision edges unapproximated medially. Dr. Lovena Le evaluated the former SICD pocket, clipped middle suture (1 of 5) and took a sterile 4x4 gauze and probed the pocket twice to remove a small amount of bloody necrotic tissue. No frank purulence noted. Dr. Lovena Le advised that [name] [name] come back early next week for this to be done again as well as on Friday the 15th. He recommended that his wife come to the next appointment for education. Wound covered with gauze and paper tape. Appointments scheduled for 9am Monday 02/26/18 and 4pm 03/02/18.

## 2018-02-24 DIAGNOSIS — N2581 Secondary hyperparathyroidism of renal origin: Secondary | ICD-10-CM | POA: Diagnosis not present

## 2018-02-24 DIAGNOSIS — Z992 Dependence on renal dialysis: Secondary | ICD-10-CM | POA: Diagnosis not present

## 2018-02-24 DIAGNOSIS — N186 End stage renal disease: Secondary | ICD-10-CM | POA: Diagnosis not present

## 2018-02-24 DIAGNOSIS — E611 Iron deficiency: Secondary | ICD-10-CM | POA: Diagnosis not present

## 2018-02-26 ENCOUNTER — Ambulatory Visit (INDEPENDENT_AMBULATORY_CARE_PROVIDER_SITE_OTHER): Payer: Medicare Other | Admitting: *Deleted

## 2018-02-26 DIAGNOSIS — I5022 Chronic systolic (congestive) heart failure: Secondary | ICD-10-CM

## 2018-02-26 NOTE — Progress Notes (Signed)
Mr. Noy presents to the [facility] today for suture removal and pocket evaluation s/o SICD explant by Dr. Lovena Le. Sternal scabbing improved along sternal line, no redness or swelling noted. Patient denies fever and chills. Dressing removed from left thorax, remaining sutures intact, incision edges unapproximated medially. Former SICD pocket evaluated, back of wound visualized, smaller than last week. Wound proved with sterile 4x4 gauze four times. No frank purulence noted. Wound covered with gauze and patient's own medipore tape. Follow up as scheduled on 03/02/18 at 4pm.

## 2018-02-27 DIAGNOSIS — E611 Iron deficiency: Secondary | ICD-10-CM | POA: Diagnosis not present

## 2018-02-27 DIAGNOSIS — N186 End stage renal disease: Secondary | ICD-10-CM | POA: Diagnosis not present

## 2018-02-27 DIAGNOSIS — N2581 Secondary hyperparathyroidism of renal origin: Secondary | ICD-10-CM | POA: Diagnosis not present

## 2018-02-27 DIAGNOSIS — Z992 Dependence on renal dialysis: Secondary | ICD-10-CM | POA: Diagnosis not present

## 2018-03-01 DIAGNOSIS — N2581 Secondary hyperparathyroidism of renal origin: Secondary | ICD-10-CM | POA: Diagnosis not present

## 2018-03-01 DIAGNOSIS — N186 End stage renal disease: Secondary | ICD-10-CM | POA: Diagnosis not present

## 2018-03-01 DIAGNOSIS — E611 Iron deficiency: Secondary | ICD-10-CM | POA: Diagnosis not present

## 2018-03-01 DIAGNOSIS — Z992 Dependence on renal dialysis: Secondary | ICD-10-CM | POA: Diagnosis not present

## 2018-03-02 ENCOUNTER — Ambulatory Visit: Payer: Medicare Other | Admitting: *Deleted

## 2018-03-02 NOTE — H&P (Signed)
ICD Criteria  Current LVEF:30%. Within 12 months prior to implant: Yes   Heart failure history: Yes, Class II  Cardiomyopathy history: Yes, Ischemic Cardiomyopathy - Prior MI.  Atrial Fibrillation/Atrial Flutter: yes  Ventricular tachycardia history: No.  Cardiac arrest history: No.  History of syndromes with risk of sudden death: No.  Previous ICD: Yes, Reason for ICD:  Primary prevention.  Current ICD indication: Primary  PPM indication: No.  Class I or II Bradycardia indication present: No  Beta Blocker therapy for 3 or more months: Yes, prescribed.   Ace Inhibitor/ARB therapy for 3 or more months: No, medical reason.

## 2018-03-03 DIAGNOSIS — Z992 Dependence on renal dialysis: Secondary | ICD-10-CM | POA: Diagnosis not present

## 2018-03-03 DIAGNOSIS — E611 Iron deficiency: Secondary | ICD-10-CM | POA: Diagnosis not present

## 2018-03-03 DIAGNOSIS — N186 End stage renal disease: Secondary | ICD-10-CM | POA: Diagnosis not present

## 2018-03-03 DIAGNOSIS — N2581 Secondary hyperparathyroidism of renal origin: Secondary | ICD-10-CM | POA: Diagnosis not present

## 2018-03-06 ENCOUNTER — Ambulatory Visit (INDEPENDENT_AMBULATORY_CARE_PROVIDER_SITE_OTHER): Payer: Medicare Other | Admitting: *Deleted

## 2018-03-06 DIAGNOSIS — Z992 Dependence on renal dialysis: Secondary | ICD-10-CM | POA: Diagnosis not present

## 2018-03-06 DIAGNOSIS — E611 Iron deficiency: Secondary | ICD-10-CM | POA: Diagnosis not present

## 2018-03-06 DIAGNOSIS — N2581 Secondary hyperparathyroidism of renal origin: Secondary | ICD-10-CM | POA: Diagnosis not present

## 2018-03-06 DIAGNOSIS — N186 End stage renal disease: Secondary | ICD-10-CM | POA: Diagnosis not present

## 2018-03-06 DIAGNOSIS — I5022 Chronic systolic (congestive) heart failure: Secondary | ICD-10-CM

## 2018-03-07 NOTE — Progress Notes (Signed)
Patient presents to the office for suture removal and pocket evaluation s/p SICD explant. Sternal scabbing is much improved. Patient denies fever and chills. Dressing removed from left thorax, minimal serosanguineous drainage noted on bandage.  Dr. Lovena Le evaluated the former SICD pocket, all sutures clipped from the incision. Unapproximation noted medially, wound was probed with sterile Qtips x2 and then 4x4 guaze five times to promote healing. No frank purulence noted. Dr. Lovena Le recommended that patient come into the office on 11/22, 11/27, and 12/2 for this to be done again. Wound covered with gauze and Tegaderm dressing. Appointments scheduled. ER precautions reviewed with patient. Patient verbalized understanding.

## 2018-03-08 ENCOUNTER — Ambulatory Visit (INDEPENDENT_AMBULATORY_CARE_PROVIDER_SITE_OTHER): Payer: Medicare Other | Admitting: *Deleted

## 2018-03-08 DIAGNOSIS — Z992 Dependence on renal dialysis: Secondary | ICD-10-CM | POA: Diagnosis not present

## 2018-03-08 DIAGNOSIS — Z5181 Encounter for therapeutic drug level monitoring: Secondary | ICD-10-CM

## 2018-03-08 DIAGNOSIS — N2581 Secondary hyperparathyroidism of renal origin: Secondary | ICD-10-CM | POA: Diagnosis not present

## 2018-03-08 DIAGNOSIS — T82868D Thrombosis of vascular prosthetic devices, implants and grafts, subsequent encounter: Secondary | ICD-10-CM | POA: Diagnosis not present

## 2018-03-08 DIAGNOSIS — Z954 Presence of other heart-valve replacement: Secondary | ICD-10-CM | POA: Diagnosis not present

## 2018-03-08 DIAGNOSIS — E611 Iron deficiency: Secondary | ICD-10-CM | POA: Diagnosis not present

## 2018-03-08 DIAGNOSIS — N186 End stage renal disease: Secondary | ICD-10-CM | POA: Diagnosis not present

## 2018-03-08 LAB — POCT INR: INR: 2 (ref 2.0–3.0)

## 2018-03-08 NOTE — Patient Instructions (Signed)
Continue coumadin 5mg  daily. Recheck in 3 weeks. Will not increase dosage yet due to chest wound from defib pocket revision.  Having dressing changes 2 x wk at [employer].

## 2018-03-09 ENCOUNTER — Ambulatory Visit (INDEPENDENT_AMBULATORY_CARE_PROVIDER_SITE_OTHER): Payer: Medicare Other | Admitting: *Deleted

## 2018-03-09 ENCOUNTER — Encounter: Payer: Self-pay | Admitting: Internal Medicine

## 2018-03-09 DIAGNOSIS — I5022 Chronic systolic (congestive) heart failure: Secondary | ICD-10-CM

## 2018-03-10 DIAGNOSIS — N2581 Secondary hyperparathyroidism of renal origin: Secondary | ICD-10-CM | POA: Diagnosis not present

## 2018-03-10 DIAGNOSIS — E611 Iron deficiency: Secondary | ICD-10-CM | POA: Diagnosis not present

## 2018-03-10 DIAGNOSIS — Z992 Dependence on renal dialysis: Secondary | ICD-10-CM | POA: Diagnosis not present

## 2018-03-10 DIAGNOSIS — N186 End stage renal disease: Secondary | ICD-10-CM | POA: Diagnosis not present

## 2018-03-10 NOTE — Progress Notes (Signed)
Patient presents to the office for pocket evaluationt. Patient denies fever and chills. Dressing removed from left thorax, minimal serosanguineous drainage noted on bandage.  Unapproximation remains medially, wound was probed with sterile tweezers and sterile 4x4 guaze five times to promote healing. No frank purulence noted. Wound covered with gauze and Tegaderm dressing. Patient aware of future appointments. ER precautions reviewed with patient. Patient verbalized understanding.

## 2018-03-13 DIAGNOSIS — E611 Iron deficiency: Secondary | ICD-10-CM | POA: Diagnosis not present

## 2018-03-13 DIAGNOSIS — Z992 Dependence on renal dialysis: Secondary | ICD-10-CM | POA: Diagnosis not present

## 2018-03-13 DIAGNOSIS — N2581 Secondary hyperparathyroidism of renal origin: Secondary | ICD-10-CM | POA: Diagnosis not present

## 2018-03-13 DIAGNOSIS — N186 End stage renal disease: Secondary | ICD-10-CM | POA: Diagnosis not present

## 2018-03-14 ENCOUNTER — Ambulatory Visit (INDEPENDENT_AMBULATORY_CARE_PROVIDER_SITE_OTHER): Payer: Medicare Other | Admitting: *Deleted

## 2018-03-14 DIAGNOSIS — T827XXS Infection and inflammatory reaction due to other cardiac and vascular devices, implants and grafts, sequela: Secondary | ICD-10-CM

## 2018-03-14 NOTE — Progress Notes (Signed)
Wound re-check s/p SICD extraction. Measurements of wound: Depth 8.5cm, Height top to bottom 3 cm and width left to right 1 cm. Per Dr.Taylor pt's wife to take dry  4x4s and a sterile Q-Tip  and clean and softly debride the wound every day and cover wound w/ 4x4s and tegaderm. Per Dr. Lovena Le pts wife can use a household Q-Tip, sanitize by wetting with alcohol, let Q-Tip dry prior to wound care. Pt and pts wife given supplies for daily dressing. Dr. Lovena Le informed pt and wife that if they are not able to do wound care then Dr. Lovena Le would write for home health to come and do wound care twice a week. Pt to f/u with [facility] in [place] for wound re-check after dialysis. Pt to call dialysis on Monday to see if he can be flipped to an afternoon session on 03/22/18 in order to see Dr. Lovena Le at an earlier apt.

## 2018-03-15 DIAGNOSIS — N2581 Secondary hyperparathyroidism of renal origin: Secondary | ICD-10-CM | POA: Diagnosis not present

## 2018-03-15 DIAGNOSIS — E611 Iron deficiency: Secondary | ICD-10-CM | POA: Diagnosis not present

## 2018-03-15 DIAGNOSIS — Z992 Dependence on renal dialysis: Secondary | ICD-10-CM | POA: Diagnosis not present

## 2018-03-15 DIAGNOSIS — N186 End stage renal disease: Secondary | ICD-10-CM | POA: Diagnosis not present

## 2018-03-17 DIAGNOSIS — N2581 Secondary hyperparathyroidism of renal origin: Secondary | ICD-10-CM | POA: Diagnosis not present

## 2018-03-17 DIAGNOSIS — Z992 Dependence on renal dialysis: Secondary | ICD-10-CM | POA: Diagnosis not present

## 2018-03-17 DIAGNOSIS — N186 End stage renal disease: Secondary | ICD-10-CM | POA: Diagnosis not present

## 2018-03-17 DIAGNOSIS — E611 Iron deficiency: Secondary | ICD-10-CM | POA: Diagnosis not present

## 2018-03-19 ENCOUNTER — Ambulatory Visit: Payer: Self-pay

## 2018-03-20 DIAGNOSIS — E611 Iron deficiency: Secondary | ICD-10-CM | POA: Diagnosis not present

## 2018-03-20 DIAGNOSIS — N186 End stage renal disease: Secondary | ICD-10-CM | POA: Diagnosis not present

## 2018-03-20 DIAGNOSIS — N2581 Secondary hyperparathyroidism of renal origin: Secondary | ICD-10-CM | POA: Diagnosis not present

## 2018-03-20 DIAGNOSIS — Z992 Dependence on renal dialysis: Secondary | ICD-10-CM | POA: Diagnosis not present

## 2018-03-22 DIAGNOSIS — N2581 Secondary hyperparathyroidism of renal origin: Secondary | ICD-10-CM | POA: Diagnosis not present

## 2018-03-22 DIAGNOSIS — Z992 Dependence on renal dialysis: Secondary | ICD-10-CM | POA: Diagnosis not present

## 2018-03-22 DIAGNOSIS — N186 End stage renal disease: Secondary | ICD-10-CM | POA: Diagnosis not present

## 2018-03-22 DIAGNOSIS — E611 Iron deficiency: Secondary | ICD-10-CM | POA: Diagnosis not present

## 2018-03-24 DIAGNOSIS — E611 Iron deficiency: Secondary | ICD-10-CM | POA: Diagnosis not present

## 2018-03-24 DIAGNOSIS — N2581 Secondary hyperparathyroidism of renal origin: Secondary | ICD-10-CM | POA: Diagnosis not present

## 2018-03-24 DIAGNOSIS — N186 End stage renal disease: Secondary | ICD-10-CM | POA: Diagnosis not present

## 2018-03-24 DIAGNOSIS — Z992 Dependence on renal dialysis: Secondary | ICD-10-CM | POA: Diagnosis not present

## 2018-03-27 DIAGNOSIS — E611 Iron deficiency: Secondary | ICD-10-CM | POA: Diagnosis not present

## 2018-03-27 DIAGNOSIS — Z992 Dependence on renal dialysis: Secondary | ICD-10-CM | POA: Diagnosis not present

## 2018-03-27 DIAGNOSIS — N186 End stage renal disease: Secondary | ICD-10-CM | POA: Diagnosis not present

## 2018-03-27 DIAGNOSIS — N2581 Secondary hyperparathyroidism of renal origin: Secondary | ICD-10-CM | POA: Diagnosis not present

## 2018-03-29 ENCOUNTER — Encounter: Payer: Self-pay | Admitting: Internal Medicine

## 2018-03-29 ENCOUNTER — Ambulatory Visit (INDEPENDENT_AMBULATORY_CARE_PROVIDER_SITE_OTHER): Payer: Medicare Other | Admitting: *Deleted

## 2018-03-29 ENCOUNTER — Ambulatory Visit (INDEPENDENT_AMBULATORY_CARE_PROVIDER_SITE_OTHER): Payer: Medicare Other | Admitting: Internal Medicine

## 2018-03-29 VITALS — BP 132/68 | HR 75 | Ht 72.0 in | Wt 302.0 lb

## 2018-03-29 DIAGNOSIS — I255 Ischemic cardiomyopathy: Secondary | ICD-10-CM | POA: Diagnosis not present

## 2018-03-29 DIAGNOSIS — Z954 Presence of other heart-valve replacement: Secondary | ICD-10-CM

## 2018-03-29 DIAGNOSIS — L988 Other specified disorders of the skin and subcutaneous tissue: Secondary | ICD-10-CM

## 2018-03-29 DIAGNOSIS — T82868D Thrombosis of vascular prosthetic devices, implants and grafts, subsequent encounter: Secondary | ICD-10-CM

## 2018-03-29 DIAGNOSIS — Z5181 Encounter for therapeutic drug level monitoring: Secondary | ICD-10-CM | POA: Diagnosis not present

## 2018-03-29 DIAGNOSIS — E611 Iron deficiency: Secondary | ICD-10-CM | POA: Diagnosis not present

## 2018-03-29 DIAGNOSIS — N186 End stage renal disease: Secondary | ICD-10-CM | POA: Diagnosis not present

## 2018-03-29 DIAGNOSIS — N2581 Secondary hyperparathyroidism of renal origin: Secondary | ICD-10-CM | POA: Diagnosis not present

## 2018-03-29 DIAGNOSIS — Z992 Dependence on renal dialysis: Secondary | ICD-10-CM | POA: Diagnosis not present

## 2018-03-29 LAB — POCT INR: INR: 2.4 (ref 2.0–3.0)

## 2018-03-29 NOTE — Patient Instructions (Signed)
Continue coumadin 5mg  daily. Recheck in 4 weeks.

## 2018-03-29 NOTE — Progress Notes (Signed)
HPI [name] [name] returns today for wound followup. He is a pleasant 52 yo man with ESRD, an ICM, who underwent S-ICD insertion which resulted in pocket hematom, debridement, pocket infection and ultimate removal of his S-ICD. He has developed a draining fistula. He has not had any fever or chills. His wife has been debriding his fistula but it has been slow to heal. No Known Allergies   Current Outpatient Medications  Medication Sig Dispense Refill  . acetaminophen-codeine (TYLENOL #3) 300-30 MG tablet Take 1-2 tablets by mouth every 6 (six) hours as needed for moderate pain. 20 tablet 0  . amiodarone (PACERONE) 200 MG tablet Take 1 tablet (200 mg total) by mouth daily. 90 tablet 3  . aspirin EC 81 MG EC tablet Take 1 tablet (81 mg total) by mouth daily.    [name] atorvastatin (LIPITOR) 80 MG tablet Take 1 tablet (80 mg total) by mouth daily at 6 PM. 90 tablet 3  . B Complex-C-Folic Acid (NEPHRO-VITE PO) Take 1 tablet by mouth daily.     [name] FOSRENOL 500 MG chewable tablet Chew 1,500-2,000 mg by mouth See admin instructions. Take 2000 mg by mouth with meals and take 1500 mg by mouth with snacks    . midodrine (PROAMATINE) 5 MG tablet Take 3 tablets (15 mg total) by mouth 3 (three) times daily with meals. 810 tablet 3  . oxyCODONE (OXY IR/ROXICODONE) 5 MG immediate release tablet Take 1 tablet (5 mg total) by mouth every 4 (four) hours as needed for severe pain. 30 tablet 0  . warfarin (COUMADIN) 5 MG tablet Take 1 tablet daily except 1/2 tablet on Tuesdays, Thursdays and Saturdays or as directed by coumadin clinic (Patient taking differently: Take 5 mg by mouth daily. ) 90 tablet 1  . metoprolol tartrate (LOPRESSOR) 25 MG tablet Take 1 tablet (25 mg total) by mouth 2 (two) times daily. 60 tablet 3   No current facility-administered medications for this visit.      Past Medical History:  Diagnosis Date  . Aneurysm ([place])     Right arm fistula 3 aneurysms 2011,   plans to have a new procedure    . Angina   . Chest discomfort   . CHF (congestive heart failure) ([place])   . Coronary artery disease   . Coronary artery disease involving native coronary artery of native heart without angina pectoris   . Dialysis patient ([place])   . Dysrhythmia   . ESRD (end stage renal disease) ([place])    on hemodialysis T_T_S  . Hypertension   . Leg pain   . Mitral regurgitation   . Morbid obesity ([place])   . Orthostatic hypotension   . Overweight(278.02)   . Renal insufficiency   . 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 Severe aortic insufficiency   . Sinus tachycardia   . Syncope     positional after dialysis... 2008    ROS:   All systems reviewed and negative except as noted in the HPI.   Past Surgical History:  Procedure Laterality Date  . A/V FISTULAGRAM Left 06/30/2016   Procedure: A/V Fistulagram;  Surgeon: Algernon Huxley, MD;  Location: [place];  Service: Cardiovascular;  Laterality: Left;  . AORTIC VALVE REPLACEMENT N/A 09/01/2017   Procedure: AORTIC VALVE REPLACEMENT (AVR);  Surgeon: Darylene Price  H, MD;  Location: [place];  Service: Open Heart Surgery;  Laterality: N/A;  . ARTERIOVENOUS GRAFT PLACEMENT    . AV FISTULA PLACEMENT    . AV FISTULA PLACEMENT Left 11/18/2015   Procedure: ARTERIOVENOUS (AV) FISTULA CREATION ( RADIOCEPHALIC );  Surgeon: Algernon Huxley, MD;  Location: [facility];  Service: Vascular;  Laterality: Left;  . AV FISTULA PLACEMENT Left 03/23/2016   Procedure: ARTERIOVENOUS (AV) FISTULA CREATION ( REVISION );  Surgeon: Algernon Huxley, MD;  Location: [facility];  Service: Vascular;  Laterality: Left;  . [place] REMOVAL Left 03/23/2016   Procedure: REMOVAL OF ARTERIOVENOUS GORETEX GRAFT ([place]);  Surgeon: Algernon Huxley, MD;  Location: [facility];  Service: Vascular;  Laterality: Left;  . CENTRAL LINE INSERTION Right 09/01/2017   Procedure:  FLOROSCOPY GUIDED PLACEMENT OF RIGHT FEMORAL CENTRAL LINE TIMES TWO  AND PLACEMENT OF SWAN GANZ CATHETER;  Surgeon: [name], MD;  Location: [place];  Service: Open Heart Surgery;  Laterality: Right;  . CORONARY ARTERY BYPASS GRAFT N/A 09/01/2017   Procedure: CORONARY ARTERY BYPASS GRAFTING (CABG) X 1, USING RIGHT GREATER SAPHENOUS VEIN HARVESTED ENDOSCOPICALLY;  Surgeon: [name], MD;  Location: [place];  Service: Open Heart Surgery;  Laterality: N/A;  . DIALYSIS/PERMA CATHETER REMOVAL N/A 08/01/2016   Procedure: Dialysis/Perma Catheter Removal;  Surgeon: Algernon Huxley, MD;  Location: [place] [facility];  Service: Cardiovascular;  Laterality: N/A;  . HERNIA REPAIR    . INSERTION OF DIALYSIS CATHETER  03/01/2011   Procedure: INSERTION OF DIALYSIS CATHETER;  Surgeon: Elam Dutch, MD;  Location: [place];  Service: Vascular;  Laterality: Left;  Exchange of Dialysis Catheter to 27cm 15Fr. Arrow Catheter  . INSERTION OF DIALYSIS CATHETER  09/01/2017   Procedure: PLACEMENT OF TRIALYSIS SHORT TERM DIALYSIS CATHETER;  Surgeon: [name], MD;  Location: [facility];  Service: Open Heart Surgery;;  . MULTIPLE EXTRACTIONS WITH ALVEOLOPLASTY N/A 08/07/2017   Procedure: Extraction of tooth #2, 7,8,13,14,15,17, and 31 with alveoloplasty and gross debridement of remaining teeth;  Surgeon: [name], DDS;  Location: [place];  Service: Oral Surgery;  Laterality: N/A;  . PERIPHERAL VASCULAR CATHETERIZATION N/A 09/01/2014   Procedure: A/V Shuntogram/Fistulagram;  Surgeon: Algernon Huxley, MD;  Location: [facility];  Service: Cardiovascular;  Laterality: N/A;  . PERIPHERAL VASCULAR CATHETERIZATION Right 09/01/2014   Procedure: Thrombectomy;  Surgeon: Algernon Huxley, MD;  Location: [place] [facility];  Service: Cardiovascular;  Laterality: Right;  . PERIPHERAL VASCULAR CATHETERIZATION Right 09/01/2014   Procedure: A/V Shunt Intervention;  Surgeon: Algernon Huxley, MD;  Location: [place] [facility];   Service: Cardiovascular;  Laterality: Right;  . PERIPHERAL VASCULAR CATHETERIZATION N/A 09/17/2014   Procedure: A/V Shuntogram/Fistulagram;  Surgeon: Algernon Huxley, MD;  Location: [place] [facility];  Service: Cardiovascular;  Laterality: N/A;  . PERIPHERAL VASCULAR CATHETERIZATION Right 10/17/2014   Procedure: Thrombectomy;  Surgeon: Katha Cabal, MD;  Location: [place] [facility];  Service: Cardiovascular;  Laterality: Right;  . PERIPHERAL VASCULAR CATHETERIZATION N/A 10/17/2014   Procedure: A/V Shuntogram/Fistulagram;  Surgeon: Katha Cabal, MD;  Location: [place] [facility];  Service: Cardiovascular;  Laterality: N/A;  . PERIPHERAL VASCULAR CATHETERIZATION N/A 10/17/2014   Procedure: A/V Shunt Intervention;  Surgeon: Katha Cabal, MD;  Location: [place] [facility];  Service: Cardiovascular;  Laterality: N/A;  . PERIPHERAL VASCULAR CATHETERIZATION Right 11/04/2014   Procedure: Thrombectomy;  Surgeon: Katha Cabal, MD;  Location: [place]  [facility];  Service: Cardiovascular;  Laterality: Right;  . PERIPHERAL VASCULAR CATHETERIZATION Left 11/04/2014   Procedure: Visceral Venography;  Surgeon: Katha Cabal, MD;  Location: [place] [facility];  Service: Cardiovascular;  Laterality: Left;  . PERIPHERAL VASCULAR CATHETERIZATION Right 11/27/2014   Procedure: A/V Shuntogram/Fistulagram;  Surgeon: Algernon Huxley, MD;  Location: [place] [facility];  Service: Cardiovascular;  Laterality: Right;  . PERIPHERAL VASCULAR CATHETERIZATION Right 11/27/2014   Procedure: A/V Shunt Intervention;  Surgeon: Algernon Huxley, MD;  Location: [facility];  Service: Cardiovascular;  Laterality: Right;  . PERIPHERAL VASCULAR CATHETERIZATION Right 12/31/2014   Procedure: Thrombectomy;  Surgeon: Algernon Huxley, MD;  Location: [facility];  Service: Cardiovascular;  Laterality: Right;  . PERIPHERAL VASCULAR CATHETERIZATION Right 01/12/2015   Procedure: A/V Shuntogram/Fistulagram;  Surgeon: Algernon Huxley, MD;  Location: [place] [facility];  Service: Cardiovascular;  Laterality: Right;  . PERIPHERAL VASCULAR CATHETERIZATION N/A 01/12/2015   Procedure: A/V Shunt Intervention;  Surgeon: Algernon Huxley, MD;  Location: [place];  Service: Cardiovascular;  Laterality: N/A;  . PERIPHERAL VASCULAR CATHETERIZATION Right 01/28/2015   Procedure: Thrombectomy;  Surgeon: Algernon Huxley, MD;  Location: [place] [facility];  Service: Cardiovascular;  Laterality: Right;  . PERIPHERAL VASCULAR CATHETERIZATION N/A 06/08/2015   Procedure: A/V Shuntogram/Fistulagram;  Surgeon: Algernon Huxley, MD;  Location: [facility];  Service: Cardiovascular;  Laterality: N/A;  . PERIPHERAL VASCULAR CATHETERIZATION N/A 06/08/2015   Procedure: A/V Shunt Intervention;  Surgeon: Algernon Huxley, MD;  Location: [place] [facility];  Service: Cardiovascular;  Laterality: N/A;  . PERIPHERAL VASCULAR CATHETERIZATION Right 07/06/2015   Procedure: A/V Shuntogram/Fistulagram;  Surgeon: Algernon Huxley, MD;  Location: [place] [facility];  Service: Cardiovascular;  Laterality: Right;  . PERIPHERAL VASCULAR CATHETERIZATION N/A 07/06/2015   Procedure: A/V Shunt Intervention;  Surgeon: Algernon Huxley, MD;  Location: [place] [facility];  Service: Cardiovascular;  Laterality: N/A;  . PERIPHERAL VASCULAR CATHETERIZATION N/A 08/04/2015   Procedure: graft declot;  Surgeon: Katha Cabal, MD;  Location: [place] [facility];  Service: Cardiovascular;  Laterality: N/A;  . PERIPHERAL VASCULAR CATHETERIZATION Right 08/25/2015   Procedure: A/V Shuntogram/Fistulagram;  Surgeon: Katha Cabal, MD;  Location: [place] [facility];  Service: Cardiovascular;  Laterality: Right;  . PERIPHERAL VASCULAR CATHETERIZATION N/A 11/04/2015   Procedure: Dialysis/Perma Catheter Insertion;  Surgeon: Algernon Huxley, MD;  Location: [place] [facility];  Service: Cardiovascular;  Laterality: N/A;  . PERIPHERAL VASCULAR CATHETERIZATION Left 12/14/2015   Procedure:  A/V Shuntogram/Fistulagram;  Surgeon: Algernon Huxley, MD;  Location: [place] [facility];  Service: Cardiovascular;  Laterality: Left;  . PERIPHERAL VASCULAR CATHETERIZATION N/A 12/14/2015   Procedure: A/V Shunt Intervention;  Surgeon: Algernon Huxley, MD;  Location: [place] [facility];  Service: Cardiovascular;  Laterality: N/A;  . PERIPHERAL VASCULAR CATHETERIZATION N/A 12/17/2015   Procedure: Dialysis/Perma Catheter Insertion;  Surgeon: Algernon Huxley, MD;  Location: [place] [facility];  Service: Cardiovascular;  Laterality: N/A;  . PERIPHERAL VASCULAR CATHETERIZATION Left 12/22/2015   Procedure: Dialysis/Perma Catheter Insertion;  Surgeon: Katha Cabal, MD;  Location: [place] [facility];  Service: Cardiovascular;  Laterality: Left;  . PERIPHERAL VASCULAR CATHETERIZATION Left 02/29/2016   Procedure: A/V Shuntogram/Fistulagram;  Surgeon: Algernon Huxley, MD;  Location: [place] [facility];  Service: Cardiovascular;  Laterality: Left;  . PERIPHERAL VASCULAR CATHETERIZATION N/A 02/29/2016   Procedure: A/V Shunt Intervention;  Surgeon: Algernon Huxley, MD;  Location: [place] [facility];  Service: Cardiovascular;  Laterality: N/A;  . POCKET REVISION/RELOCATION N/A 01/01/2018   Procedure: POCKET REVISION/RELOCATION;  Surgeon: Evans Lance, MD;  Location: [place] [facility];  Service: Cardiovascular;  Laterality: N/A;  . right arm graft     for dyalisis  . RIGHT/LEFT HEART CATH AND CORONARY ANGIOGRAPHY N/A 08/02/2017   Procedure: RIGHT/LEFT HEART CATH AND CORONARY ANGIOGRAPHY;  Surgeon: Larey Dresser, MD;  Location: [place] [facility];  Service: Cardiovascular;  Laterality: N/A;  . SUBQ ICD IMPLANT N/A 11/28/2017   Procedure: SUBQ ICD IMPLANT;  Surgeon: Evans Lance, MD;  Location: [place] [facility];  Service: Cardiovascular;  Laterality: N/A;  . SUBQ ICD REVISION N/A 02/12/2018   Procedure: SUBQ ICD REVISION(REMOVAL);  Surgeon: Evans Lance, MD;  Location: [place] [facility];  Service:  Cardiovascular;  Laterality: N/A;  . TEE WITHOUT CARDIOVERSION N/A 07/31/2017   Procedure: TRANSESOPHAGEAL ECHOCARDIOGRAM (TEE);  Surgeon: Josue Hector, MD;  [facility];  Service: Cardiovascular;  Laterality: N/A;  . TEE WITHOUT CARDIOVERSION N/A 09/01/2017   Procedure: TRANSESOPHAGEAL ECHOCARDIOGRAM (TEE);  Surgeon: [name], MD;  Location: [place];  Service: Open Heart Surgery;  Laterality: N/A;  . THROMBECTOMY    . THROMBECTOMY W/ EMBOLECTOMY  03/01/2011   Procedure: THROMBECTOMY ARTERIOVENOUS GORE-TEX GRAFT;  Surgeon: Elam Dutch, MD;  Location: [place];  Service: Vascular;  Laterality: Right;  Attempted Thrombectomy of Old  Right Upper Arm Arteriovenous gortex Graft. Insertion of new Arteriovenous Graft using 61mm x 50cm Gortex Stretch graft.   . THROMBECTOMY W/ EMBOLECTOMY  07/11/2011   Procedure: THROMBECTOMY ARTERIOVENOUS GORE-TEX GRAFT;  Surgeon: Rosetta Posner, MD;  Location: [place];  Service: Vascular;  Laterality: Right;  . UMBILICAL HERNIA REPAIR    . VENOGRAM N/A 08/23/2011   Procedure: VENOGRAM;  Surgeon: [name], MD;  Location: [facility];  Service: Cardiovascular;  Laterality: N/A;     Family History  Problem Relation Age of Onset  . Hypertension Mother   . Heart disease Mother   . Hypertension Father   . Heart disease Father      Social History   Socioeconomic History  . Marital status: Married    Spouse name: Not on file  . Number of children: Not on file  . Years of education: Not on file  . Highest education level: Not on file  Occupational History  . Occupation: disabled  Social Needs  . Financial resource strain: Not on file  . Food insecurity:    Worry: Not on file    Inability: Not on file  . Transportation needs:    Medical: Not on file    Non-medical: Not on file  Tobacco Use  . Smoking status: Former Smoker    Types: Cigarettes    Last attempt to quit: 04/18/1994    Years since quitting: 23.9  . Smokeless tobacco:  Current User    Types: Snuff  . Tobacco comment: dips 1/2 snuff per day times 25 years  Substance and Sexual Activity  . Alcohol use: No  . Drug use: No  . Sexual activity: Not on file  Lifestyle  . Physical activity:    Days per week: Not on file    Minutes per session: Not on file  . Stress: Not on file  Relationships  . Social connections:    Talks on phone: Not on file    Gets together:  Not on file    Attends religious service: Not on file    Active member of club or organization: Not on file    Attends meetings of clubs or organizations: Not on file    Relationship status: Not on file  . Intimate partner violence:    Fear of current or ex partner: Not on file    Emotionally abused: Not on file    Physically abused: Not on file    Forced sexual activity: Not on file  Other Topics Concern  . Not on file  Social History Narrative  . Not on file     BP 132/68 (BP Location: Right Arm)   Pulse 75   Ht 6' (1.829 m)   Wt (!) 302 lb (137 kg)   SpO2 93%   BMI 40.96 kg/m   Physical Exam:  Well appearing NAD HEENT: Unremarkable Neck:  No JVD, no thyromegally Lymphatics:  No adenopathy Back:  No CVA tenderness Lungs:  Clear HEART:  Regular rate rhythm, no murmurs, no rubs, no clicks Abd:  soft, positive bowel sounds, no organomegally, no rebound, no guarding, a one cm, 10 cm tract  Ext:  2 plus pulses, no edema, no cyanosis, no clubbing Skin:  No rashes no nodules Neuro:  CN II through XII intact, motor grossly intact   Assess/Plan: 1. Fistula - today I probed his pocket and there was a small amount of infectious material. He appears to be slowly healing. His wife will continue to debride the wound daily, and I have encouraged him to wash the area once or twice daily with packing material.  [name] [name].D.

## 2018-03-29 NOTE — Patient Instructions (Signed)
Medication Instructions:  Your physician recommends that you continue on your current medications as directed. Please refer to the Current Medication list given to you today.  If you need a refill on your cardiac medications before your next appointment, please call your pharmacy.   Lab work: NONE  If you have labs (blood work) drawn today and your tests are completely normal, you will receive your results only by: . MyChart Message (if you have MyChart) OR . A paper copy in the mail If you have any lab test that is abnormal or we need to change your treatment, we will call you to review the results.  Testing/Procedures: NONE   Follow-Up: At [facility], you and your health needs are our priority.  As part of our continuing mission to provide you with exceptional heart care, we have created designated Provider Care Teams.  These Care Teams include your primary Cardiologist (physician) and Advanced Practice Providers (APPs -  Physician Assistants and Nurse Practitioners) who all work together to provide you with the care you need, when you need it. You will need a follow up appointment in 1 months.  Please call our office 2 months in advance to schedule this appointment.  You may see Suresh Koneswaran, MD or one of the following Advanced Practice Providers on your designated Care Team:   Brittany Strader, PA-C ([place] Office) . Michele Lenze, PA-C ([place] Office)  Any Other Special Instructions Will Be Listed Below (If Applicable). Thank you for choosing [facility]!     

## 2018-03-31 DIAGNOSIS — Z992 Dependence on renal dialysis: Secondary | ICD-10-CM | POA: Diagnosis not present

## 2018-03-31 DIAGNOSIS — N186 End stage renal disease: Secondary | ICD-10-CM | POA: Diagnosis not present

## 2018-03-31 DIAGNOSIS — E611 Iron deficiency: Secondary | ICD-10-CM | POA: Diagnosis not present

## 2018-03-31 DIAGNOSIS — N2581 Secondary hyperparathyroidism of renal origin: Secondary | ICD-10-CM | POA: Diagnosis not present

## 2018-04-02 ENCOUNTER — Other Ambulatory Visit: Payer: Self-pay | Admitting: Cardiovascular Disease

## 2018-04-03 ENCOUNTER — Encounter: Payer: Self-pay | Admitting: Internal Medicine

## 2018-04-03 DIAGNOSIS — E611 Iron deficiency: Secondary | ICD-10-CM | POA: Diagnosis not present

## 2018-04-03 DIAGNOSIS — N186 End stage renal disease: Secondary | ICD-10-CM | POA: Diagnosis not present

## 2018-04-03 DIAGNOSIS — Z992 Dependence on renal dialysis: Secondary | ICD-10-CM | POA: Diagnosis not present

## 2018-04-03 DIAGNOSIS — N2581 Secondary hyperparathyroidism of renal origin: Secondary | ICD-10-CM | POA: Diagnosis not present

## 2018-04-05 DIAGNOSIS — Z992 Dependence on renal dialysis: Secondary | ICD-10-CM | POA: Diagnosis not present

## 2018-04-05 DIAGNOSIS — N186 End stage renal disease: Secondary | ICD-10-CM | POA: Diagnosis not present

## 2018-04-05 DIAGNOSIS — N2581 Secondary hyperparathyroidism of renal origin: Secondary | ICD-10-CM | POA: Diagnosis not present

## 2018-04-05 DIAGNOSIS — E611 Iron deficiency: Secondary | ICD-10-CM | POA: Diagnosis not present

## 2018-04-09 DIAGNOSIS — E611 Iron deficiency: Secondary | ICD-10-CM | POA: Diagnosis not present

## 2018-04-09 DIAGNOSIS — Z992 Dependence on renal dialysis: Secondary | ICD-10-CM | POA: Diagnosis not present

## 2018-04-09 DIAGNOSIS — N2581 Secondary hyperparathyroidism of renal origin: Secondary | ICD-10-CM | POA: Diagnosis not present

## 2018-04-09 DIAGNOSIS — N186 End stage renal disease: Secondary | ICD-10-CM | POA: Diagnosis not present

## 2018-04-12 DIAGNOSIS — Z992 Dependence on renal dialysis: Secondary | ICD-10-CM | POA: Diagnosis not present

## 2018-04-12 DIAGNOSIS — E611 Iron deficiency: Secondary | ICD-10-CM | POA: Diagnosis not present

## 2018-04-12 DIAGNOSIS — N186 End stage renal disease: Secondary | ICD-10-CM | POA: Diagnosis not present

## 2018-04-12 DIAGNOSIS — N2581 Secondary hyperparathyroidism of renal origin: Secondary | ICD-10-CM | POA: Diagnosis not present

## 2018-04-14 DIAGNOSIS — Z992 Dependence on renal dialysis: Secondary | ICD-10-CM | POA: Diagnosis not present

## 2018-04-14 DIAGNOSIS — N2581 Secondary hyperparathyroidism of renal origin: Secondary | ICD-10-CM | POA: Diagnosis not present

## 2018-04-14 DIAGNOSIS — E611 Iron deficiency: Secondary | ICD-10-CM | POA: Diagnosis not present

## 2018-04-14 DIAGNOSIS — N186 End stage renal disease: Secondary | ICD-10-CM | POA: Diagnosis not present

## 2018-04-17 ENCOUNTER — Encounter: Payer: Self-pay | Admitting: *Deleted

## 2018-04-17 DIAGNOSIS — N186 End stage renal disease: Secondary | ICD-10-CM | POA: Diagnosis not present

## 2018-04-17 DIAGNOSIS — E611 Iron deficiency: Secondary | ICD-10-CM | POA: Diagnosis not present

## 2018-04-17 DIAGNOSIS — Z992 Dependence on renal dialysis: Secondary | ICD-10-CM | POA: Diagnosis not present

## 2018-04-17 DIAGNOSIS — N2581 Secondary hyperparathyroidism of renal origin: Secondary | ICD-10-CM | POA: Diagnosis not present

## 2018-04-21 DIAGNOSIS — N186 End stage renal disease: Secondary | ICD-10-CM | POA: Diagnosis not present

## 2018-04-21 DIAGNOSIS — N2581 Secondary hyperparathyroidism of renal origin: Secondary | ICD-10-CM | POA: Diagnosis not present

## 2018-04-21 DIAGNOSIS — D509 Iron deficiency anemia, unspecified: Secondary | ICD-10-CM | POA: Diagnosis not present

## 2018-04-21 DIAGNOSIS — Z992 Dependence on renal dialysis: Secondary | ICD-10-CM | POA: Diagnosis not present

## 2018-04-21 DIAGNOSIS — E611 Iron deficiency: Secondary | ICD-10-CM | POA: Diagnosis not present

## 2018-04-24 DIAGNOSIS — E611 Iron deficiency: Secondary | ICD-10-CM | POA: Diagnosis not present

## 2018-04-24 DIAGNOSIS — N2581 Secondary hyperparathyroidism of renal origin: Secondary | ICD-10-CM | POA: Diagnosis not present

## 2018-04-24 DIAGNOSIS — D509 Iron deficiency anemia, unspecified: Secondary | ICD-10-CM | POA: Diagnosis not present

## 2018-04-24 DIAGNOSIS — N186 End stage renal disease: Secondary | ICD-10-CM | POA: Diagnosis not present

## 2018-04-24 DIAGNOSIS — Z992 Dependence on renal dialysis: Secondary | ICD-10-CM | POA: Diagnosis not present

## 2018-04-26 DIAGNOSIS — D509 Iron deficiency anemia, unspecified: Secondary | ICD-10-CM | POA: Diagnosis not present

## 2018-04-26 DIAGNOSIS — N2581 Secondary hyperparathyroidism of renal origin: Secondary | ICD-10-CM | POA: Diagnosis not present

## 2018-04-26 DIAGNOSIS — N186 End stage renal disease: Secondary | ICD-10-CM | POA: Diagnosis not present

## 2018-04-26 DIAGNOSIS — Z992 Dependence on renal dialysis: Secondary | ICD-10-CM | POA: Diagnosis not present

## 2018-04-26 DIAGNOSIS — E611 Iron deficiency: Secondary | ICD-10-CM | POA: Diagnosis not present

## 2018-04-28 DIAGNOSIS — N2581 Secondary hyperparathyroidism of renal origin: Secondary | ICD-10-CM | POA: Diagnosis not present

## 2018-04-28 DIAGNOSIS — E611 Iron deficiency: Secondary | ICD-10-CM | POA: Diagnosis not present

## 2018-04-28 DIAGNOSIS — Z992 Dependence on renal dialysis: Secondary | ICD-10-CM | POA: Diagnosis not present

## 2018-04-28 DIAGNOSIS — D509 Iron deficiency anemia, unspecified: Secondary | ICD-10-CM | POA: Diagnosis not present

## 2018-04-28 DIAGNOSIS — N186 End stage renal disease: Secondary | ICD-10-CM | POA: Diagnosis not present

## 2018-05-01 DIAGNOSIS — N186 End stage renal disease: Secondary | ICD-10-CM | POA: Diagnosis not present

## 2018-05-01 DIAGNOSIS — Z992 Dependence on renal dialysis: Secondary | ICD-10-CM | POA: Diagnosis not present

## 2018-05-01 DIAGNOSIS — D509 Iron deficiency anemia, unspecified: Secondary | ICD-10-CM | POA: Diagnosis not present

## 2018-05-01 DIAGNOSIS — N2581 Secondary hyperparathyroidism of renal origin: Secondary | ICD-10-CM | POA: Diagnosis not present

## 2018-05-01 DIAGNOSIS — E611 Iron deficiency: Secondary | ICD-10-CM | POA: Diagnosis not present

## 2018-05-02 ENCOUNTER — Ambulatory Visit (INDEPENDENT_AMBULATORY_CARE_PROVIDER_SITE_OTHER): Payer: Self-pay | Admitting: Pharmacist

## 2018-05-02 ENCOUNTER — Ambulatory Visit (INDEPENDENT_AMBULATORY_CARE_PROVIDER_SITE_OTHER): Payer: Medicare Other | Admitting: Internal Medicine

## 2018-05-02 ENCOUNTER — Encounter: Payer: Self-pay | Admitting: Internal Medicine

## 2018-05-02 VITALS — BP 134/70 | HR 85 | Ht 72.0 in | Wt 303.0 lb

## 2018-05-02 DIAGNOSIS — T82868D Thrombosis of vascular prosthetic devices, implants and grafts, subsequent encounter: Secondary | ICD-10-CM

## 2018-05-02 DIAGNOSIS — I5022 Chronic systolic (congestive) heart failure: Secondary | ICD-10-CM | POA: Diagnosis not present

## 2018-05-02 DIAGNOSIS — Z954 Presence of other heart-valve replacement: Secondary | ICD-10-CM | POA: Diagnosis not present

## 2018-05-02 DIAGNOSIS — Z5181 Encounter for therapeutic drug level monitoring: Secondary | ICD-10-CM

## 2018-05-02 LAB — POCT INR: INR: 2 (ref 2.0–3.0)

## 2018-05-02 NOTE — Progress Notes (Signed)
HPI [name] returns today for wound followup. He is a pleasant 53 yo man with ESRD, an ICM, who underwent S-ICD insertion which resulted in pocket hematom, debridement, pocket infection and ultimate removal of his S-ICD. He has developed a draining fistula. He has not had any fever or chills. His wife has been debriding his fistula but it has been slow to heal. Since I saw him last, not much change. He has a large pannus that occludes the incision.  No Known Allergies   Current Outpatient Medications  Medication Sig Dispense Refill  . acetaminophen-codeine (TYLENOL #3) 300-30 MG tablet Take 1-2 tablets by mouth every 6 (six) hours as needed for moderate pain. 20 tablet 0  . amiodarone (PACERONE) 200 MG tablet Take 1 tablet (200 mg total) by mouth daily. 90 tablet 3  . aspirin EC 81 MG EC tablet Take 1 tablet (81 mg total) by mouth daily.    [name] atorvastatin (LIPITOR) 80 MG tablet Take 1 tablet (80 mg total) by mouth daily at 6 PM. 90 tablet 3  . B Complex-C-Folic Acid (NEPHRO-VITE PO) Take 1 tablet by mouth daily.     [name] FOSRENOL 500 MG chewable tablet Chew 1,500-2,000 mg by mouth See admin instructions. Take 2000 mg by mouth with meals and take 1500 mg by mouth with snacks    . metoprolol tartrate (LOPRESSOR) 25 MG tablet TAKE 1 TABLET BY MOUTH TWICE DAILY 60 tablet 6  . midodrine (PROAMATINE) 5 MG tablet Take 3 tablets (15 mg total) by mouth 3 (three) times daily with meals. 810 tablet 3  . oxyCODONE (OXY IR/ROXICODONE) 5 MG immediate release tablet Take 1 tablet (5 mg total) by mouth every 4 (four) hours as needed for severe pain. 30 tablet 0  . warfarin (COUMADIN) 5 MG tablet Take 1 tablet daily except 1/2 tablet on Tuesdays, Thursdays and Saturdays or as directed by coumadin clinic (Patient taking differently: Take 5 mg by mouth daily. ) 90 tablet 1   No current facility-administered medications for this visit.      Past Medical History:  Diagnosis Date  . Aneurysm ([place])    Right arm fistula 3 aneurysms 2011,   plans to have a new procedure  . Angina   . Chest discomfort   . CHF (congestive heart failure) ([place])   . Coronary artery disease   . Coronary artery disease involving native coronary artery of native heart without angina pectoris   . Dialysis patient ([place])   . Dysrhythmia   . ESRD (end stage renal disease) ([place])    on hemodialysis [id number]  . Hypertension   . Leg pain   . Mitral regurgitation   . Morbid obesity ([place])   . Orthostatic hypotension   . Overweight(278.02)   . Renal insufficiency   . 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 Severe aortic insufficiency   . Sinus tachycardia   . Syncope     positional after dialysis... 2008    ROS:   All systems reviewed and negative except as noted in the HPI.   Past Surgical History:  Procedure Laterality Date  . A/V FISTULAGRAM Left 06/30/2016   Procedure: A/V Fistulagram;  Surgeon: Algernon Huxley, MD;  Location: [place] [facility];  Service: Cardiovascular;  Laterality: Left;  . AORTIC VALVE REPLACEMENT N/A 09/01/2017  Procedure: AORTIC VALVE REPLACEMENT (AVR);  Surgeon: [name], MD;  Location: [place];  Service: Open Heart Surgery;  Laterality: N/A;  . ARTERIOVENOUS GRAFT PLACEMENT    . AV FISTULA PLACEMENT    . AV FISTULA PLACEMENT Left 11/18/2015   Procedure: ARTERIOVENOUS (AV) FISTULA CREATION ( RADIOCEPHALIC );  Surgeon: Algernon Huxley, MD;  Location: [facility];  Service: Vascular;  Laterality: Left;  . AV FISTULA PLACEMENT Left 03/23/2016   Procedure: ARTERIOVENOUS (AV) FISTULA CREATION ( REVISION );  Surgeon: Algernon Huxley, MD;  Location: [facility];  Service: Vascular;  Laterality: Left;  . [place] REMOVAL Left 03/23/2016   Procedure: REMOVAL OF ARTERIOVENOUS GORETEX GRAFT ([place]);  Surgeon: Algernon Huxley, MD;  Location: [facility];  Service: Vascular;  Laterality:  Left;  . CENTRAL LINE INSERTION Right 09/01/2017   Procedure: FLOROSCOPY GUIDED PLACEMENT OF RIGHT FEMORAL CENTRAL LINE TIMES TWO  AND PLACEMENT OF SWAN GANZ CATHETER;  Surgeon: [name], MD;  Location: [place];  Service: Open Heart Surgery;  Laterality: Right;  . CORONARY ARTERY BYPASS GRAFT N/A 09/01/2017   Procedure: CORONARY ARTERY BYPASS GRAFTING (CABG) X 1, USING RIGHT GREATER SAPHENOUS VEIN HARVESTED ENDOSCOPICALLY;  Surgeon: [name], MD;  Location: [place];  Service: Open Heart Surgery;  Laterality: N/A;  . DIALYSIS/PERMA CATHETER REMOVAL N/A 08/01/2016   Procedure: Dialysis/Perma Catheter Removal;  Surgeon: Algernon Huxley, MD;  Location: [place] [facility];  Service: Cardiovascular;  Laterality: N/A;  . HERNIA REPAIR    . INSERTION OF DIALYSIS CATHETER  03/01/2011   Procedure: INSERTION OF DIALYSIS CATHETER;  Surgeon: Elam Dutch, MD;  Location: [place];  Service: Vascular;  Laterality: Left;  Exchange of Dialysis Catheter to 27cm 15Fr. Arrow Catheter  . INSERTION OF DIALYSIS CATHETER  09/01/2017   Procedure: PLACEMENT OF TRIALYSIS SHORT TERM DIALYSIS CATHETER;  Surgeon: [name], MD;  Location: [place] [facility];  Service: Open Heart Surgery;;  . MULTIPLE EXTRACTIONS WITH ALVEOLOPLASTY N/A 08/07/2017   Procedure: Extraction of tooth #2, 7,8,13,14,15,17, and 31 with alveoloplasty and gross debridement of remaining teeth;  Surgeon: [name], DDS;  Location: [place];  Service: Oral Surgery;  Laterality: N/A;  . PERIPHERAL VASCULAR CATHETERIZATION N/A 09/01/2014   Procedure: A/V Shuntogram/Fistulagram;  Surgeon: Algernon Huxley, MD;  Location: [place] [facility];  Service: Cardiovascular;  Laterality: N/A;  . PERIPHERAL VASCULAR CATHETERIZATION Right 09/01/2014   Procedure: Thrombectomy;  Surgeon: Algernon Huxley, MD;  Location: [place] [facility];  Service: Cardiovascular;  Laterality: Right;  . PERIPHERAL VASCULAR CATHETERIZATION Right 09/01/2014   Procedure: A/V Shunt Intervention;   Surgeon: Algernon Huxley, MD;  Location: [place] [facility];  Service: Cardiovascular;  Laterality: Right;  . PERIPHERAL VASCULAR CATHETERIZATION N/A 09/17/2014   Procedure: A/V Shuntogram/Fistulagram;  Surgeon: Algernon Huxley, MD;  Location: [place] [facility];  Service: Cardiovascular;  Laterality: N/A;  . PERIPHERAL VASCULAR CATHETERIZATION Right 10/17/2014   Procedure: Thrombectomy;  Surgeon: Katha Cabal, MD;  Location: [place] [facility];  Service: Cardiovascular;  Laterality: Right;  . PERIPHERAL VASCULAR CATHETERIZATION N/A 10/17/2014   Procedure: A/V Shuntogram/Fistulagram;  Surgeon: Katha Cabal, MD;  Location: [place] [facility];  Service: Cardiovascular;  Laterality: N/A;  . PERIPHERAL VASCULAR CATHETERIZATION N/A 10/17/2014   Procedure: A/V Shunt Intervention;  Surgeon: Katha Cabal, MD;  Location: [place] [facility];  Service: Cardiovascular;  Laterality: N/A;  . PERIPHERAL VASCULAR CATHETERIZATION Right 11/04/2014   Procedure: Thrombectomy;  Surgeon: Katha Cabal, MD;  Location: [place] [facility];  Service: Cardiovascular;  Laterality: Right;  . PERIPHERAL VASCULAR CATHETERIZATION Left 11/04/2014   Procedure: Visceral Venography;  Surgeon: Katha Cabal, MD;  Location: [place] [facility];  Service: Cardiovascular;  Laterality: Left;  . PERIPHERAL VASCULAR CATHETERIZATION Right 11/27/2014   Procedure: A/V Shuntogram/Fistulagram;  Surgeon: Algernon Huxley, MD;  Location: [place] [facility];  Service: Cardiovascular;  Laterality: Right;  . PERIPHERAL VASCULAR CATHETERIZATION Right 11/27/2014   Procedure: A/V Shunt Intervention;  Surgeon: Algernon Huxley, MD;  Location: [place] [facility];  Service: Cardiovascular;  Laterality: Right;  . PERIPHERAL VASCULAR CATHETERIZATION Right 12/31/2014   Procedure: Thrombectomy;  Surgeon: Algernon Huxley, MD;  Location: [place] [facility];  Service: Cardiovascular;  Laterality: Right;  . PERIPHERAL VASCULAR CATHETERIZATION Right  01/12/2015   Procedure: A/V Shuntogram/Fistulagram;  Surgeon: Algernon Huxley, MD;  Location: [place] [facility];  Service: Cardiovascular;  Laterality: Right;  . PERIPHERAL VASCULAR CATHETERIZATION N/A 01/12/2015   Procedure: A/V Shunt Intervention;  Surgeon: Algernon Huxley, MD;  Location: [place] [facility];  Service: Cardiovascular;  Laterality: N/A;  . PERIPHERAL VASCULAR CATHETERIZATION Right 01/28/2015   Procedure: Thrombectomy;  Surgeon: Algernon Huxley, MD;  Location: [place] [facility];  Service: Cardiovascular;  Laterality: Right;  . PERIPHERAL VASCULAR CATHETERIZATION N/A 06/08/2015   Procedure: A/V Shuntogram/Fistulagram;  Surgeon: Algernon Huxley, MD;  Location: [place] [facility];  Service: Cardiovascular;  Laterality: N/A;  . PERIPHERAL VASCULAR CATHETERIZATION N/A 06/08/2015   Procedure: A/V Shunt Intervention;  Surgeon: Algernon Huxley, MD;  Location: [place] [facility];  Service: Cardiovascular;  Laterality: N/A;  . PERIPHERAL VASCULAR CATHETERIZATION Right 07/06/2015   Procedure: A/V Shuntogram/Fistulagram;  Surgeon: Algernon Huxley, MD;  Location: [place] [facility];  Service: Cardiovascular;  Laterality: Right;  . PERIPHERAL VASCULAR CATHETERIZATION N/A 07/06/2015   Procedure: A/V Shunt Intervention;  Surgeon: Algernon Huxley, MD;  Location: [place] [facility];  Service: Cardiovascular;  Laterality: N/A;  . PERIPHERAL VASCULAR CATHETERIZATION N/A 08/04/2015   Procedure: graft declot;  Surgeon: Katha Cabal, MD;  Location: [place] [facility];  Service: Cardiovascular;  Laterality: N/A;  . PERIPHERAL VASCULAR CATHETERIZATION Right 08/25/2015   Procedure: A/V Shuntogram/Fistulagram;  Surgeon: Katha Cabal, MD;  Location: [place] [facility];  Service: Cardiovascular;  Laterality: Right;  . PERIPHERAL VASCULAR CATHETERIZATION N/A 11/04/2015   Procedure: Dialysis/Perma Catheter Insertion;  Surgeon: Algernon Huxley, MD;  Location: [place] [facility];  Service: Cardiovascular;  Laterality: N/A;    . PERIPHERAL VASCULAR CATHETERIZATION Left 12/14/2015   Procedure: A/V Shuntogram/Fistulagram;  Surgeon: Algernon Huxley, MD;  Location: [place] [facility];  Service: Cardiovascular;  Laterality: Left;  . PERIPHERAL VASCULAR CATHETERIZATION N/A 12/14/2015   Procedure: A/V Shunt Intervention;  Surgeon: Algernon Huxley, MD;  Location: [place] [facility];  Service: Cardiovascular;  Laterality: N/A;  . PERIPHERAL VASCULAR CATHETERIZATION N/A 12/17/2015   Procedure: Dialysis/Perma Catheter Insertion;  Surgeon: Algernon Huxley, MD;  Location: [place] [facility];  Service: Cardiovascular;  Laterality: N/A;  . PERIPHERAL VASCULAR CATHETERIZATION Left 12/22/2015   Procedure: Dialysis/Perma Catheter Insertion;  Surgeon: Katha Cabal, MD;  Location: [place] [facility];  Service: Cardiovascular;  Laterality: Left;  . PERIPHERAL VASCULAR CATHETERIZATION Left 02/29/2016   Procedure: A/V Shuntogram/Fistulagram;  Surgeon: Algernon Huxley, MD;  Location: [place] [facility];  Service: Cardiovascular;  Laterality: Left;  . PERIPHERAL VASCULAR  CATHETERIZATION N/A 02/29/2016   Procedure: A/V Shunt Intervention;  Surgeon: Algernon Huxley, MD;  Location: [place] [facility];  Service: Cardiovascular;  Laterality: N/A;  . POCKET REVISION/RELOCATION N/A 01/01/2018   Procedure: POCKET REVISION/RELOCATION;  Surgeon: Evans Lance, MD;  Location: [place] [facility];  Service: Cardiovascular;  Laterality: N/A;  . right arm graft     for dyalisis  . RIGHT/LEFT HEART CATH AND CORONARY ANGIOGRAPHY N/A 08/02/2017   Procedure: RIGHT/LEFT HEART CATH AND CORONARY ANGIOGRAPHY;  Surgeon: Larey Dresser, MD;  Location: [place] [facility];  Service: Cardiovascular;  Laterality: N/A;  . SUBQ ICD IMPLANT N/A 11/28/2017   Procedure: SUBQ ICD IMPLANT;  Surgeon: Evans Lance, MD;  Location: [place] [facility];  Service: Cardiovascular;  Laterality: N/A;  . SUBQ ICD REVISION N/A 02/12/2018   Procedure: SUBQ ICD REVISION(REMOVAL);  Surgeon:  Evans Lance, MD;  Location: [place] [facility];  Service: Cardiovascular;  Laterality: N/A;  . TEE WITHOUT CARDIOVERSION N/A 07/31/2017   Procedure: TRANSESOPHAGEAL ECHOCARDIOGRAM (TEE);  Surgeon: Josue Hector, MD;  Location: [facility];  Service: Cardiovascular;  Laterality: N/A;  . TEE WITHOUT CARDIOVERSION N/A 09/01/2017   Procedure: TRANSESOPHAGEAL ECHOCARDIOGRAM (TEE);  Surgeon: [name], MD;  Location: [place];  Service: Open Heart Surgery;  Laterality: N/A;  . THROMBECTOMY    . THROMBECTOMY W/ EMBOLECTOMY  03/01/2011   Procedure: THROMBECTOMY ARTERIOVENOUS GORE-TEX GRAFT;  Surgeon: Elam Dutch, MD;  Location: [place];  Service: Vascular;  Laterality: Right;  Attempted Thrombectomy of Old  Right Upper Arm Arteriovenous gortex Graft. Insertion of new Arteriovenous Graft using 28mm x 50cm Gortex Stretch graft.   . THROMBECTOMY W/ EMBOLECTOMY  07/11/2011   Procedure: THROMBECTOMY ARTERIOVENOUS GORE-TEX GRAFT;  Surgeon: Rosetta Posner, MD;  Location: [place];  Service: Vascular;  Laterality: Right;  . UMBILICAL HERNIA REPAIR    . VENOGRAM N/A 08/23/2011   Procedure: VENOGRAM;  Surgeon: [name], MD;  Location: [facility];  Service: Cardiovascular;  Laterality: N/A;     Family History  Problem Relation Age of Onset  . Hypertension Mother   . Heart disease Mother   . Hypertension Father   . Heart disease Father      Social History   Socioeconomic History  . Marital status: Married    Spouse name: Not on file  . Number of children: Not on file  . Years of education: Not on file  . Highest education level: Not on file  Occupational History  . Occupation: disabled  Social Needs  . Financial resource strain: Not on file  . Food insecurity:    Worry: Not on file    Inability: Not on file  . Transportation needs:    Medical: Not on file    Non-medical: Not on file  Tobacco Use  . Smoking status: Former Smoker    Types: Cigarettes    Last attempt to quit:  04/18/1994    Years since quitting: 24.0  . Smokeless tobacco: Current User    Types: Snuff  . Tobacco comment: dips 1/2 snuff per day times 25 years  Substance and Sexual Activity  . Alcohol use: No  . Drug use: No  . Sexual activity: Not on file  Lifestyle  . Physical activity:    Days per week: Not on file    Minutes per session: Not on file  . Stress: Not on file  Relationships  . Social connections:    Talks  on phone: Not on file    Gets together: Not on file    Attends religious service: Not on file    Active member of club or organization: Not on file    Attends meetings of clubs or organizations: Not on file    Relationship status: Not on file  . Intimate partner violence:    Fear of current or ex partner: Not on file    Emotionally abused: Not on file    Physically abused: Not on file    Forced sexual activity: Not on file  Other Topics Concern  . Not on file  Social History Narrative  . Not on file     BP 134/70   Pulse 85   Ht 6' (1.829 m)   Wt (!) 303 lb (137.4 kg)   SpO2 94%   BMI 41.09 kg/m   Physical Exam:  Well appearing NAD HEENT: Unremarkable Neck:  No JVD, no thyromegally Lymphatics:  No adenopathy Back:  No CVA tenderness Lungs:  Clear HEART:  Regular rate rhythm, no murmurs, no rubs, no clicks Abd:  soft, obese, positive bowel sounds, no organomegally, no rebound, no guarding, the patient has a one cm hole in the skin, approximately 12 cm deep. I have probed the incision today.  Ext:  2 plus pulses, no edema, no cyanosis, no clubbing Skin:  No rashes no nodules Neuro:  CN II through XII intact, motor grossly intact   Assess/Plan: 1. 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 I do not think he is getting the wound care he needs at home. I will refer him for additional evaluation.  2. ICM - he denies anginal symptoms. No change in his meds. 3. Obesity - he has lost weight but his weight loss has slowed. I have encouraged  him.  Mikle Bosworth.D.

## 2018-05-02 NOTE — Patient Instructions (Signed)
Medication Instructions:  Your physician recommends that you continue on your current medications as directed. Please refer to the Current Medication list given to you today.  If you need a refill on your cardiac medications before your next appointment, please call your pharmacy.   Lab work: NONE  If you have labs (blood work) drawn today and your tests are completely normal, you will receive your results only by: [name] MyChart Message (if you have MyChart) OR . A paper copy in the mail If you have any lab test that is abnormal or we need to change your treatment, we will call you to review the results.  Testing/Procedures: NONE   Follow-Up: At [facility], you and your health needs are our priority.  As part of our continuing mission to provide you with exceptional heart care, we have created designated Provider Care Teams.  These Care Teams include your primary Cardiologist (physician) and Advanced Practice Providers (APPs -  Physician Assistants and Nurse Practitioners) who all work together to provide you with the care you need, when you need it. You will need a follow up appointment to be determined  .  Please call our office 2 months in advance to schedule this appointment.  You may see Cristopher Peru, MD or one of the following Advanced Practice Providers on your designated Care Team:   Chanetta Marshall, NP . Tommye Standard, PA-C  Any Other Special Instructions Will Be Listed Below (If Applicable). Thank you for choosing [facility]!

## 2018-05-03 DIAGNOSIS — Z992 Dependence on renal dialysis: Secondary | ICD-10-CM | POA: Diagnosis not present

## 2018-05-03 DIAGNOSIS — E611 Iron deficiency: Secondary | ICD-10-CM | POA: Diagnosis not present

## 2018-05-03 DIAGNOSIS — N2581 Secondary hyperparathyroidism of renal origin: Secondary | ICD-10-CM | POA: Diagnosis not present

## 2018-05-03 DIAGNOSIS — D509 Iron deficiency anemia, unspecified: Secondary | ICD-10-CM | POA: Diagnosis not present

## 2018-05-03 DIAGNOSIS — N186 End stage renal disease: Secondary | ICD-10-CM | POA: Diagnosis not present

## 2018-05-05 DIAGNOSIS — Z992 Dependence on renal dialysis: Secondary | ICD-10-CM | POA: Diagnosis not present

## 2018-05-05 DIAGNOSIS — N2581 Secondary hyperparathyroidism of renal origin: Secondary | ICD-10-CM | POA: Diagnosis not present

## 2018-05-05 DIAGNOSIS — N186 End stage renal disease: Secondary | ICD-10-CM | POA: Diagnosis not present

## 2018-05-05 DIAGNOSIS — D509 Iron deficiency anemia, unspecified: Secondary | ICD-10-CM | POA: Diagnosis not present

## 2018-05-05 DIAGNOSIS — E611 Iron deficiency: Secondary | ICD-10-CM | POA: Diagnosis not present

## 2018-05-08 DIAGNOSIS — D509 Iron deficiency anemia, unspecified: Secondary | ICD-10-CM | POA: Diagnosis not present

## 2018-05-08 DIAGNOSIS — Z992 Dependence on renal dialysis: Secondary | ICD-10-CM | POA: Diagnosis not present

## 2018-05-08 DIAGNOSIS — E611 Iron deficiency: Secondary | ICD-10-CM | POA: Diagnosis not present

## 2018-05-08 DIAGNOSIS — N186 End stage renal disease: Secondary | ICD-10-CM | POA: Diagnosis not present

## 2018-05-08 DIAGNOSIS — N2581 Secondary hyperparathyroidism of renal origin: Secondary | ICD-10-CM | POA: Diagnosis not present

## 2018-05-09 ENCOUNTER — Other Ambulatory Visit: Payer: Self-pay | Admitting: *Deleted

## 2018-05-09 ENCOUNTER — Telehealth: Payer: Self-pay | Admitting: *Deleted

## 2018-05-09 DIAGNOSIS — L988 Other specified disorders of the skin and subcutaneous tissue: Secondary | ICD-10-CM

## 2018-05-09 NOTE — Telephone Encounter (Signed)
Called to notify pt of consult with Dr. Georgette Dover. Patient notified and voiced understanding.

## 2018-05-10 ENCOUNTER — Telehealth: Payer: Self-pay | Admitting: Internal Medicine

## 2018-05-10 DIAGNOSIS — Z992 Dependence on renal dialysis: Secondary | ICD-10-CM | POA: Diagnosis not present

## 2018-05-10 DIAGNOSIS — N186 End stage renal disease: Secondary | ICD-10-CM | POA: Diagnosis not present

## 2018-05-10 DIAGNOSIS — D509 Iron deficiency anemia, unspecified: Secondary | ICD-10-CM | POA: Diagnosis not present

## 2018-05-10 DIAGNOSIS — N2581 Secondary hyperparathyroidism of renal origin: Secondary | ICD-10-CM | POA: Diagnosis not present

## 2018-05-10 DIAGNOSIS — E611 Iron deficiency: Secondary | ICD-10-CM | POA: Diagnosis not present

## 2018-05-10 NOTE — Telephone Encounter (Signed)
Spoke with patient's wife. She describes that a blister formed yesterday below his sternum (? subxiphoid incisional area). Now draining pus. This wound area was previously healed per wife. No fever/chills. Advised to keep site covered with gauze. ED precautions given for fever/chills overnight. Plan for OV with Dr. Lovena Le tomorrow at 2:15pm to assess site. Discussed with Sonia Baller, RN. Patient's wife is in agreement with this plan and denies questions or concerns at this time.

## 2018-05-10 NOTE — Telephone Encounter (Signed)
°  1. Has your device fired? no  2. Is you device beeping? no  3. Are you experiencing draining or swelling at device site? yes  4. Are you calling to see if we received your device transmission? no  5. Have you passed out? no    Please route to [place]  Patient's wife is calling about a pus pocket the patient has on his chest. She said if has popped but it keeps draining.  She wants to know what she should do.

## 2018-05-11 ENCOUNTER — Ambulatory Visit (INDEPENDENT_AMBULATORY_CARE_PROVIDER_SITE_OTHER): Payer: Medicare Other | Admitting: Internal Medicine

## 2018-05-11 ENCOUNTER — Encounter: Payer: Self-pay | Admitting: Internal Medicine

## 2018-05-11 VITALS — BP 132/80 | HR 80 | Ht 72.0 in | Wt 297.0 lb

## 2018-05-11 DIAGNOSIS — T82837D Hemorrhage of cardiac prosthetic devices, implants and grafts, subsequent encounter: Secondary | ICD-10-CM

## 2018-05-11 DIAGNOSIS — I5022 Chronic systolic (congestive) heart failure: Secondary | ICD-10-CM

## 2018-05-11 NOTE — Patient Instructions (Addendum)
Medication Instructions:  Your physician recommends that you continue on your current medications as directed. Please refer to the Current Medication list given to you today.  Labwork: None ordered.  Testing/Procedures: None ordered.  Follow-Up: Your physician wants you to follow-up in: 6 weeks with Dr. Lovena Le in [place] visit.   Any Other Special Instructions Will Be Listed Below (If Applicable).  If you need a refill on your cardiac medications before your next appointment, please call your pharmacy.

## 2018-05-11 NOTE — Progress Notes (Signed)
HPI Mr. Niazi returns today for wound followup. He is a pleasant 53 yo man with ESRD, an ICM, who underwent S-ICD insertion which resulted in pocket hematom, debridement, pocket infection and ultimate removal of his S-ICD. He has developed a draining fistula. He has not had any fever or chills. His wife has been debriding his fistula but it has been slow to heal. Since I saw him last, I have discussed his case with Dr. Georgette Dover and he will see the patient back. He notes that a new area on his skin has opened. It is tender.  No Known Allergies   Current Outpatient Medications  Medication Sig Dispense Refill  . acetaminophen-codeine (TYLENOL #3) 300-30 MG tablet Take 1-2 tablets by mouth every 6 (six) hours as needed for moderate pain. 20 tablet 0  . amiodarone (PACERONE) 200 MG tablet Take 1 tablet (200 mg total) by mouth daily. 90 tablet 3  . aspirin EC 81 MG EC tablet Take 1 tablet (81 mg total) by mouth daily.    [name] atorvastatin (LIPITOR) 80 MG tablet Take 1 tablet (80 mg total) by mouth daily at 6 PM. 90 tablet 3  . B Complex-C-Folic Acid (NEPHRO-VITE PO) Take 1 tablet by mouth daily.     [name] FOSRENOL 500 MG chewable tablet Chew 1,500-2,000 mg by mouth See admin instructions. Take 2000 mg by mouth with meals and take 1500 mg by mouth with snacks    . metoprolol tartrate (LOPRESSOR) 25 MG tablet TAKE 1 TABLET BY MOUTH TWICE DAILY 60 tablet 6  . midodrine (PROAMATINE) 5 MG tablet Take 3 tablets (15 mg total) by mouth 3 (three) times daily with meals. 810 tablet 3  . oxyCODONE (OXY IR/ROXICODONE) 5 MG immediate release tablet Take 1 tablet (5 mg total) by mouth every 4 (four) hours as needed for severe pain. 30 tablet 0  . warfarin (COUMADIN) 5 MG tablet Take 1 tablet daily except 1/2 tablet on Tuesdays, Thursdays and Saturdays or as directed by coumadin clinic (Patient taking differently: Take 5 mg by mouth daily. ) 90 tablet 1   No current facility-administered medications for this visit.        Past Medical History:  Diagnosis Date  . Aneurysm ([place])     Right arm fistula 3 aneurysms 2011,   plans to have a new procedure  . Angina   . Chest discomfort   . CHF (congestive heart failure) ([place])   . Coronary artery disease   . Coronary artery disease involving native coronary artery of native heart without angina pectoris   . Dialysis patient ([place])   . Dysrhythmia   . ESRD (end stage renal disease) ([place])    on hemodialysis T_T_S  . Hypertension   . Leg pain   . Mitral regurgitation   . Morbid obesity ([place])   . Orthostatic hypotension   . Overweight(278.02)   . Renal insufficiency   . 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 Severe aortic insufficiency   . Sinus tachycardia   . Syncope     positional after dialysis... 2008    ROS:   All systems reviewed and negative except as noted in the HPI.   Past Surgical History:  Procedure Laterality Date  . A/V FISTULAGRAM Left 06/30/2016   Procedure: A/V Fistulagram;  Surgeon: Algernon Huxley, MD;  Location: [facility];  Service: Cardiovascular;  Laterality: Left;  . AORTIC VALVE REPLACEMENT N/A 09/01/2017   Procedure: AORTIC VALVE REPLACEMENT (AVR);  Surgeon: [name], MD;  Location: [facility];  Service: Open Heart Surgery;  Laterality: N/A;  . ARTERIOVENOUS GRAFT PLACEMENT    . AV FISTULA PLACEMENT    . AV FISTULA PLACEMENT Left 11/18/2015   Procedure: ARTERIOVENOUS (AV) FISTULA CREATION ( RADIOCEPHALIC );  Surgeon: Algernon Huxley, MD;  Location: [facility];  Service: Vascular;  Laterality: Left;  . AV FISTULA PLACEMENT Left 03/23/2016   Procedure: ARTERIOVENOUS (AV) FISTULA CREATION ( REVISION );  Surgeon: Algernon Huxley, MD;  Location: [facility];  Service: Vascular;  Laterality: Left;  . [place] REMOVAL Left 03/23/2016   Procedure: REMOVAL OF ARTERIOVENOUS GORETEX GRAFT ([place]);  Surgeon:  Algernon Huxley, MD;  Location: [facility];  Service: Vascular;  Laterality: Left;  . CENTRAL LINE INSERTION Right 09/01/2017   Procedure: FLOROSCOPY GUIDED PLACEMENT OF RIGHT FEMORAL CENTRAL LINE TIMES TWO  AND PLACEMENT OF SWAN GANZ CATHETER;  Surgeon: [name], MD;  Location: [place];  Service: Open Heart Surgery;  Laterality: Right;  . CORONARY ARTERY BYPASS GRAFT N/A 09/01/2017   Procedure: CORONARY ARTERY BYPASS GRAFTING (CABG) X 1, USING RIGHT GREATER SAPHENOUS VEIN HARVESTED ENDOSCOPICALLY;  Surgeon: [name], MD;  Location: [place];  Service: Open Heart Surgery;  Laterality: N/A;  . DIALYSIS/PERMA CATHETER REMOVAL N/A 08/01/2016   Procedure: Dialysis/Perma Catheter Removal;  Surgeon: Algernon Huxley, MD;  Location: [place];  Service: Cardiovascular;  Laterality: N/A;  . HERNIA REPAIR    . INSERTION OF DIALYSIS CATHETER  03/01/2011   Procedure: INSERTION OF DIALYSIS CATHETER;  Surgeon: Elam Dutch, MD;  Location: [facility] [facility];  Service: Vascular;  Laterality: Left;  Exchange of Dialysis Catheter to 27cm 15Fr. Arrow Catheter  . INSERTION OF DIALYSIS CATHETER  09/01/2017   Procedure: PLACEMENT OF TRIALYSIS SHORT TERM DIALYSIS CATHETER;  Surgeon: [name], MD;  Location: [facility];  Service: Open Heart Surgery;;  . MULTIPLE EXTRACTIONS WITH ALVEOLOPLASTY N/A 08/07/2017   Procedure: Extraction of tooth #2, 7,8,13,14,15,17, and 31 with alveoloplasty and gross debridement of remaining teeth;  Surgeon: [name], DDS;  Location: [place];  Service: Oral Surgery;  Laterality: N/A;  . PERIPHERAL VASCULAR CATHETERIZATION N/A 09/01/2014   Procedure: A/V Shuntogram/Fistulagram;  Surgeon: Algernon Huxley, MD;  Location: [facility];  Service: Cardiovascular;  Laterality: N/A;  . PERIPHERAL VASCULAR CATHETERIZATION Right 09/01/2014   Procedure: Thrombectomy;  Surgeon: Algernon Huxley, MD;  Location: [facility];  Service: Cardiovascular;  Laterality: Right;  . PERIPHERAL  VASCULAR CATHETERIZATION Right 09/01/2014   Procedure: A/V Shunt Intervention;  Surgeon: Algernon Huxley, MD;  Location: [facility];  Service: Cardiovascular;  Laterality: Right;  . PERIPHERAL VASCULAR CATHETERIZATION N/A 09/17/2014   Procedure: A/V Shuntogram/Fistulagram;  Surgeon: Algernon Huxley, MD;  Location: [facility];  Service: Cardiovascular;  Laterality: N/A;  . PERIPHERAL VASCULAR CATHETERIZATION Right 10/17/2014   Procedure: Thrombectomy;  Surgeon: Katha Cabal, MD;  Location: [facility];  Service: Cardiovascular;  Laterality: Right;  . PERIPHERAL VASCULAR CATHETERIZATION N/A 10/17/2014   Procedure: A/V Shuntogram/Fistulagram;  Surgeon: Katha Cabal, MD;  Location: [facility];  Service: Cardiovascular;  Laterality: N/A;  . PERIPHERAL VASCULAR CATHETERIZATION N/A 10/17/2014   Procedure: A/V Shunt Intervention;  Surgeon: Katha Cabal, MD;  Location: [facility];  Service: Cardiovascular;  Laterality: N/A;  . PERIPHERAL VASCULAR CATHETERIZATION Right 11/04/2014   Procedure: Thrombectomy;  Surgeon: Katha Cabal, MD;  Location: [facility];  Service: Cardiovascular;  Laterality: Right;  . PERIPHERAL VASCULAR CATHETERIZATION Left 11/04/2014   Procedure: Visceral Venography;  Surgeon: Katha Cabal, MD;  Location: [facility];  Service: Cardiovascular;  Laterality: Left;  . PERIPHERAL VASCULAR CATHETERIZATION Right 11/27/2014   Procedure: A/V Shuntogram/Fistulagram;  Surgeon: Algernon Huxley, MD;  Location: [facility];  Service: Cardiovascular;  Laterality: Right;  . PERIPHERAL VASCULAR CATHETERIZATION Right 11/27/2014   Procedure: A/V Shunt Intervention;  Surgeon: Algernon Huxley, MD;  Location: [facility];  Service: Cardiovascular;  Laterality: Right;  . PERIPHERAL VASCULAR CATHETERIZATION Right 12/31/2014   Procedure: Thrombectomy;  Surgeon: Algernon Huxley, MD;  Location: [facility];  Service:  Cardiovascular;  Laterality: Right;  . PERIPHERAL VASCULAR CATHETERIZATION Right 01/12/2015   Procedure: A/V Shuntogram/Fistulagram;  Surgeon: Algernon Huxley, MD;  Location: [facility];  Service: Cardiovascular;  Laterality: Right;  . PERIPHERAL VASCULAR CATHETERIZATION N/A 01/12/2015   Procedure: A/V Shunt Intervention;  Surgeon: Algernon Huxley, MD;  Location: [facility] [facility];  Service: Cardiovascular;  Laterality: N/A;  . PERIPHERAL VASCULAR CATHETERIZATION Right 01/28/2015   Procedure: Thrombectomy;  Surgeon: Algernon Huxley, MD;  Location: [facility];  Service: Cardiovascular;  Laterality: Right;  . PERIPHERAL VASCULAR CATHETERIZATION N/A 06/08/2015   Procedure: A/V Shuntogram/Fistulagram;  Surgeon: Algernon Huxley, MD;  Location: [facility];  Service: Cardiovascular;  Laterality: N/A;  . PERIPHERAL VASCULAR CATHETERIZATION N/A 06/08/2015   Procedure: A/V Shunt Intervention;  Surgeon: Algernon Huxley, MD;  Location: [facility];  Service: Cardiovascular;  Laterality: N/A;  . PERIPHERAL VASCULAR CATHETERIZATION Right 07/06/2015   Procedure: A/V Shuntogram/Fistulagram;  Surgeon: Algernon Huxley, MD;  Location: [facility];  Service: Cardiovascular;  Laterality: Right;  . PERIPHERAL VASCULAR CATHETERIZATION N/A 07/06/2015   Procedure: A/V Shunt Intervention;  Surgeon: Algernon Huxley, MD;  Location: [facility];  Service: Cardiovascular;  Laterality: N/A;  . PERIPHERAL VASCULAR CATHETERIZATION N/A 08/04/2015   Procedure: graft declot;  Surgeon: Katha Cabal, MD;  Location: [facility];  Service: Cardiovascular;  Laterality: N/A;  . PERIPHERAL VASCULAR CATHETERIZATION Right 08/25/2015   Procedure: A/V Shuntogram/Fistulagram;  Surgeon: Katha Cabal, MD;  Location: [facility];  Service: Cardiovascular;  Laterality: Right;  . PERIPHERAL VASCULAR CATHETERIZATION N/A 11/04/2015   Procedure: Dialysis/Perma Catheter Insertion;  Surgeon: Algernon Huxley,  MD;  Location: [facility];  Service: Cardiovascular;  Laterality: N/A;  . PERIPHERAL VASCULAR CATHETERIZATION Left 12/14/2015   Procedure: A/V Shuntogram/Fistulagram;  Surgeon: Algernon Huxley, MD;  Location: [facility];  Service: Cardiovascular;  Laterality: Left;  . PERIPHERAL VASCULAR CATHETERIZATION N/A 12/14/2015   Procedure: A/V Shunt Intervention;  Surgeon: Algernon Huxley, MD;  Location: [facility];  Service: Cardiovascular;  Laterality: N/A;  . PERIPHERAL VASCULAR CATHETERIZATION N/A 12/17/2015   Procedure: Dialysis/Perma Catheter Insertion;  Surgeon: Algernon Huxley, MD;  Location: [facility];  Service: Cardiovascular;  Laterality: N/A;  . PERIPHERAL VASCULAR CATHETERIZATION Left 12/22/2015   Procedure: Dialysis/Perma Catheter Insertion;  Surgeon: Katha Cabal, MD;  Location: [facility];  Service: Cardiovascular;  Laterality: Left;  . PERIPHERAL VASCULAR CATHETERIZATION Left 02/29/2016   Procedure: A/V Shuntogram/Fistulagram;  Surgeon: Corene Cornea  Bunnie Domino, MD;  Location: [facility];  Service: Cardiovascular;  Laterality: Left;  . PERIPHERAL VASCULAR CATHETERIZATION N/A 02/29/2016   Procedure: A/V Shunt Intervention;  Surgeon: Algernon Huxley, MD;  Location: [facility];  Service: Cardiovascular;  Laterality: N/A;  . POCKET REVISION/RELOCATION N/A 01/01/2018   Procedure: POCKET REVISION/RELOCATION;  Surgeon: Evans Lance, MD;  Location: [facility];  Service: Cardiovascular;  Laterality: N/A;  . right arm graft     for dyalisis  . RIGHT/LEFT HEART CATH AND CORONARY ANGIOGRAPHY N/A 08/02/2017   Procedure: RIGHT/LEFT HEART CATH AND CORONARY ANGIOGRAPHY;  Surgeon: Larey Dresser, MD;  Location: [facility];  Service: Cardiovascular;  Laterality: N/A;  . SUBQ ICD IMPLANT N/A 11/28/2017   Procedure: SUBQ ICD IMPLANT;  Surgeon: Evans Lance, MD;  Location: [place] [facility];  Service: Cardiovascular;  Laterality: N/A;  . SUBQ  ICD REVISION N/A 02/12/2018   Procedure: SUBQ ICD REVISION(REMOVAL);  Surgeon: Evans Lance, MD;  Location: [place];  Service: Cardiovascular;  Laterality: N/A;  . TEE WITHOUT CARDIOVERSION N/A 07/31/2017   Procedure: TRANSESOPHAGEAL ECHOCARDIOGRAM (TEE);  Surgeon: Josue Hector, MD;  [facility];  Service: Cardiovascular;  Laterality: N/A;  . TEE WITHOUT CARDIOVERSION N/A 09/01/2017   Procedure: TRANSESOPHAGEAL ECHOCARDIOGRAM (TEE);  Surgeon: [name], MD;  Location: [place];  Service: Open Heart Surgery;  Laterality: N/A;  . THROMBECTOMY    . THROMBECTOMY W/ EMBOLECTOMY  03/01/2011   Procedure: THROMBECTOMY ARTERIOVENOUS GORE-TEX GRAFT;  Surgeon: Elam Dutch, MD;  Location: [place];  Service: Vascular;  Laterality: Right;  Attempted Thrombectomy of Old  Right Upper Arm Arteriovenous gortex Graft. Insertion of new Arteriovenous Graft using 78mm x 50cm Gortex Stretch graft.   . THROMBECTOMY W/ EMBOLECTOMY  07/11/2011   Procedure: THROMBECTOMY ARTERIOVENOUS GORE-TEX GRAFT;  Surgeon: Rosetta Posner, MD;  Location: [facility];  Service: Vascular;  Laterality: Right;  . UMBILICAL HERNIA REPAIR    . VENOGRAM N/A 08/23/2011   Procedure: VENOGRAM;  Surgeon: [name], MD;  [facility];  Service: Cardiovascular;  Laterality: N/A;     Family History  Problem Relation Age of Onset  . Hypertension Mother   . Heart disease Mother   . Hypertension Father   . Heart disease Father      Social History   Socioeconomic History  . Marital status: Married    Spouse name: Not on file  . Number of children: Not on file  . Years of education: Not on file  . Highest education level: Not on file  Occupational History  . Occupation: disabled  Social Needs  . Financial resource strain: Not on file  . Food insecurity:    Worry: Not on file    Inability: Not on file  . Transportation needs:    Medical: Not on file    Non-medical: Not on file  Tobacco Use  .  Smoking status: Former Smoker    Types: Cigarettes    Last attempt to quit: 04/18/1994    Years since quitting: 24.0  . Smokeless tobacco: Current User    Types: Snuff  . Tobacco comment: dips 1/2 snuff per day times 25 years  Substance and Sexual Activity  . Alcohol use: No  . Drug use: No  . Sexual activity: Not on file  Lifestyle  . Physical activity:    Days per week: Not on file    Minutes per session:  Not on file  . Stress: Not on file  Relationships  . Social connections:    Talks on phone: Not on file    Gets together: Not on file    Attends religious service: Not on file    Active member of club or organization: Not on file    Attends meetings of clubs or organizations: Not on file    Relationship status: Not on file  . Intimate partner violence:    Fear of current or ex partner: Not on file    Emotionally abused: Not on file    Physically abused: Not on file    Forced sexual activity: Not on file  Other Topics Concern  . Not on file  Social History Narrative  . Not on file     BP 132/80   Pulse 80   Ht 6' (1.829 m)   Wt 297 lb (134.7 kg)   SpO2 97%   BMI 40.28 kg/m   Physical Exam:  Well appearing overweight man, NAD HEENT: Unremarkable Neck:  6 cm JVD, no thyromegally Lymphatics:  No adenopathy Back:  No CVA tenderness Lungs:  Clear with no wheezes HEART:  Regular rate rhythm, no murmurs, no rubs, no clicks Abd:  soft, positive bowel sounds, no organomegally, no rebound, no guarding Ext:  2 plus pulses, no edema, no cyanosis, no clubbing Skin:  Multiple draining wounds along the abdominal surface. Each probed Neuro:  CN II through XII intact, motor grossly intact   Assess/Plan: 1. Infected incision - he is very slow to heal. I suspect he will need a wound vac and I have asked Dr. Georgette Dover to evaluate. 2. Chronic systolic heart failure - his symptoms are class 2. No change in his meds. 3. CAD - he is s/p CABG. He denies anginal symptoms.  Salome Spotted.

## 2018-05-15 DIAGNOSIS — N186 End stage renal disease: Secondary | ICD-10-CM | POA: Diagnosis not present

## 2018-05-15 DIAGNOSIS — D509 Iron deficiency anemia, unspecified: Secondary | ICD-10-CM | POA: Diagnosis not present

## 2018-05-15 DIAGNOSIS — E611 Iron deficiency: Secondary | ICD-10-CM | POA: Diagnosis not present

## 2018-05-15 DIAGNOSIS — N2581 Secondary hyperparathyroidism of renal origin: Secondary | ICD-10-CM | POA: Diagnosis not present

## 2018-05-15 DIAGNOSIS — Z992 Dependence on renal dialysis: Secondary | ICD-10-CM | POA: Diagnosis not present

## 2018-05-17 ENCOUNTER — Telehealth: Payer: Self-pay | Admitting: *Deleted

## 2018-05-17 ENCOUNTER — Other Ambulatory Visit: Payer: Self-pay

## 2018-05-17 DIAGNOSIS — N186 End stage renal disease: Secondary | ICD-10-CM | POA: Diagnosis not present

## 2018-05-17 DIAGNOSIS — N2581 Secondary hyperparathyroidism of renal origin: Secondary | ICD-10-CM | POA: Diagnosis not present

## 2018-05-17 DIAGNOSIS — T82837D Hemorrhage of cardiac prosthetic devices, implants and grafts, subsequent encounter: Secondary | ICD-10-CM

## 2018-05-17 DIAGNOSIS — E611 Iron deficiency: Secondary | ICD-10-CM | POA: Diagnosis not present

## 2018-05-17 DIAGNOSIS — Z992 Dependence on renal dialysis: Secondary | ICD-10-CM | POA: Diagnosis not present

## 2018-05-17 DIAGNOSIS — D509 Iron deficiency anemia, unspecified: Secondary | ICD-10-CM | POA: Diagnosis not present

## 2018-05-17 NOTE — Telephone Encounter (Signed)
Called to notify pt that Dr. Kellie Moor office will be contacting him regarding his appt. Pt voiced understanding.

## 2018-05-17 NOTE — Progress Notes (Signed)
Call placed to [place] with Dr. Georgette Dover at [facility].  No referral had been received.  Reentered referral as Urgent  Fax to [phone]  Phone # [phone]

## 2018-05-18 DIAGNOSIS — N186 End stage renal disease: Secondary | ICD-10-CM | POA: Diagnosis not present

## 2018-05-18 DIAGNOSIS — Z992 Dependence on renal dialysis: Secondary | ICD-10-CM | POA: Diagnosis not present

## 2018-05-19 DIAGNOSIS — Z992 Dependence on renal dialysis: Secondary | ICD-10-CM | POA: Diagnosis not present

## 2018-05-19 DIAGNOSIS — E611 Iron deficiency: Secondary | ICD-10-CM | POA: Diagnosis not present

## 2018-05-19 DIAGNOSIS — N186 End stage renal disease: Secondary | ICD-10-CM | POA: Diagnosis not present

## 2018-05-19 DIAGNOSIS — N2581 Secondary hyperparathyroidism of renal origin: Secondary | ICD-10-CM | POA: Diagnosis not present

## 2018-05-22 DIAGNOSIS — Z992 Dependence on renal dialysis: Secondary | ICD-10-CM | POA: Diagnosis not present

## 2018-05-22 DIAGNOSIS — N186 End stage renal disease: Secondary | ICD-10-CM | POA: Diagnosis not present

## 2018-05-22 DIAGNOSIS — E611 Iron deficiency: Secondary | ICD-10-CM | POA: Diagnosis not present

## 2018-05-22 DIAGNOSIS — N2581 Secondary hyperparathyroidism of renal origin: Secondary | ICD-10-CM | POA: Diagnosis not present

## 2018-05-24 DIAGNOSIS — N186 End stage renal disease: Secondary | ICD-10-CM | POA: Diagnosis not present

## 2018-05-24 DIAGNOSIS — N2581 Secondary hyperparathyroidism of renal origin: Secondary | ICD-10-CM | POA: Diagnosis not present

## 2018-05-24 DIAGNOSIS — Z992 Dependence on renal dialysis: Secondary | ICD-10-CM | POA: Diagnosis not present

## 2018-05-24 DIAGNOSIS — E611 Iron deficiency: Secondary | ICD-10-CM | POA: Diagnosis not present

## 2018-05-26 DIAGNOSIS — E611 Iron deficiency: Secondary | ICD-10-CM | POA: Diagnosis not present

## 2018-05-26 DIAGNOSIS — Z992 Dependence on renal dialysis: Secondary | ICD-10-CM | POA: Diagnosis not present

## 2018-05-26 DIAGNOSIS — N2581 Secondary hyperparathyroidism of renal origin: Secondary | ICD-10-CM | POA: Diagnosis not present

## 2018-05-26 DIAGNOSIS — N186 End stage renal disease: Secondary | ICD-10-CM | POA: Diagnosis not present

## 2018-05-28 DIAGNOSIS — L089 Local infection of the skin and subcutaneous tissue, unspecified: Secondary | ICD-10-CM | POA: Diagnosis not present

## 2018-05-28 DIAGNOSIS — S31109A Unspecified open wound of abdominal wall, unspecified quadrant without penetration into peritoneal cavity, initial encounter: Secondary | ICD-10-CM | POA: Diagnosis not present

## 2018-05-29 DIAGNOSIS — E611 Iron deficiency: Secondary | ICD-10-CM | POA: Diagnosis not present

## 2018-05-29 DIAGNOSIS — N2581 Secondary hyperparathyroidism of renal origin: Secondary | ICD-10-CM | POA: Diagnosis not present

## 2018-05-29 DIAGNOSIS — N186 End stage renal disease: Secondary | ICD-10-CM | POA: Diagnosis not present

## 2018-05-29 DIAGNOSIS — Z992 Dependence on renal dialysis: Secondary | ICD-10-CM | POA: Diagnosis not present

## 2018-05-31 DIAGNOSIS — E611 Iron deficiency: Secondary | ICD-10-CM | POA: Diagnosis not present

## 2018-05-31 DIAGNOSIS — Z992 Dependence on renal dialysis: Secondary | ICD-10-CM | POA: Diagnosis not present

## 2018-05-31 DIAGNOSIS — N186 End stage renal disease: Secondary | ICD-10-CM | POA: Diagnosis not present

## 2018-05-31 DIAGNOSIS — N2581 Secondary hyperparathyroidism of renal origin: Secondary | ICD-10-CM | POA: Diagnosis not present

## 2018-06-02 DIAGNOSIS — Z992 Dependence on renal dialysis: Secondary | ICD-10-CM | POA: Diagnosis not present

## 2018-06-02 DIAGNOSIS — E611 Iron deficiency: Secondary | ICD-10-CM | POA: Diagnosis not present

## 2018-06-02 DIAGNOSIS — N186 End stage renal disease: Secondary | ICD-10-CM | POA: Diagnosis not present

## 2018-06-02 DIAGNOSIS — N2581 Secondary hyperparathyroidism of renal origin: Secondary | ICD-10-CM | POA: Diagnosis not present

## 2018-06-05 DIAGNOSIS — E611 Iron deficiency: Secondary | ICD-10-CM | POA: Diagnosis not present

## 2018-06-05 DIAGNOSIS — N186 End stage renal disease: Secondary | ICD-10-CM | POA: Diagnosis not present

## 2018-06-05 DIAGNOSIS — N2581 Secondary hyperparathyroidism of renal origin: Secondary | ICD-10-CM | POA: Diagnosis not present

## 2018-06-05 DIAGNOSIS — Z992 Dependence on renal dialysis: Secondary | ICD-10-CM | POA: Diagnosis not present

## 2018-06-06 ENCOUNTER — Ambulatory Visit (INDEPENDENT_AMBULATORY_CARE_PROVIDER_SITE_OTHER): Payer: Medicare Other | Admitting: *Deleted

## 2018-06-06 DIAGNOSIS — T82868D Thrombosis of vascular prosthetic devices, implants and grafts, subsequent encounter: Secondary | ICD-10-CM

## 2018-06-06 DIAGNOSIS — Z954 Presence of other heart-valve replacement: Secondary | ICD-10-CM | POA: Diagnosis not present

## 2018-06-06 DIAGNOSIS — Z5181 Encounter for therapeutic drug level monitoring: Secondary | ICD-10-CM

## 2018-06-06 LAB — POCT INR: INR: 3.3 — AB (ref 2.0–3.0)

## 2018-06-06 NOTE — Patient Instructions (Signed)
Hold coumadin tonight then resume 5mg  daily. Recheck in 4 weeks.

## 2018-06-09 DIAGNOSIS — E611 Iron deficiency: Secondary | ICD-10-CM | POA: Diagnosis not present

## 2018-06-09 DIAGNOSIS — N2581 Secondary hyperparathyroidism of renal origin: Secondary | ICD-10-CM | POA: Diagnosis not present

## 2018-06-09 DIAGNOSIS — Z992 Dependence on renal dialysis: Secondary | ICD-10-CM | POA: Diagnosis not present

## 2018-06-09 DIAGNOSIS — N186 End stage renal disease: Secondary | ICD-10-CM | POA: Diagnosis not present

## 2018-06-12 DIAGNOSIS — E611 Iron deficiency: Secondary | ICD-10-CM | POA: Diagnosis not present

## 2018-06-12 DIAGNOSIS — Z992 Dependence on renal dialysis: Secondary | ICD-10-CM | POA: Diagnosis not present

## 2018-06-12 DIAGNOSIS — N186 End stage renal disease: Secondary | ICD-10-CM | POA: Diagnosis not present

## 2018-06-12 DIAGNOSIS — N2581 Secondary hyperparathyroidism of renal origin: Secondary | ICD-10-CM | POA: Diagnosis not present

## 2018-06-14 DIAGNOSIS — E611 Iron deficiency: Secondary | ICD-10-CM | POA: Diagnosis not present

## 2018-06-14 DIAGNOSIS — Z992 Dependence on renal dialysis: Secondary | ICD-10-CM | POA: Diagnosis not present

## 2018-06-14 DIAGNOSIS — L089 Local infection of the skin and subcutaneous tissue, unspecified: Secondary | ICD-10-CM | POA: Diagnosis not present

## 2018-06-14 DIAGNOSIS — S31109S Unspecified open wound of abdominal wall, unspecified quadrant without penetration into peritoneal cavity, sequela: Secondary | ICD-10-CM | POA: Diagnosis not present

## 2018-06-14 DIAGNOSIS — N186 End stage renal disease: Secondary | ICD-10-CM | POA: Diagnosis not present

## 2018-06-14 DIAGNOSIS — N2581 Secondary hyperparathyroidism of renal origin: Secondary | ICD-10-CM | POA: Diagnosis not present

## 2018-06-16 DIAGNOSIS — E611 Iron deficiency: Secondary | ICD-10-CM | POA: Diagnosis not present

## 2018-06-16 DIAGNOSIS — N2581 Secondary hyperparathyroidism of renal origin: Secondary | ICD-10-CM | POA: Diagnosis not present

## 2018-06-16 DIAGNOSIS — Z992 Dependence on renal dialysis: Secondary | ICD-10-CM | POA: Diagnosis not present

## 2018-06-16 DIAGNOSIS — N186 End stage renal disease: Secondary | ICD-10-CM | POA: Diagnosis not present

## 2018-06-19 DIAGNOSIS — N186 End stage renal disease: Secondary | ICD-10-CM | POA: Diagnosis not present

## 2018-06-19 DIAGNOSIS — Z992 Dependence on renal dialysis: Secondary | ICD-10-CM | POA: Diagnosis not present

## 2018-06-19 DIAGNOSIS — N2581 Secondary hyperparathyroidism of renal origin: Secondary | ICD-10-CM | POA: Diagnosis not present

## 2018-06-19 DIAGNOSIS — E611 Iron deficiency: Secondary | ICD-10-CM | POA: Diagnosis not present

## 2018-06-21 DIAGNOSIS — Z992 Dependence on renal dialysis: Secondary | ICD-10-CM | POA: Diagnosis not present

## 2018-06-21 DIAGNOSIS — N186 End stage renal disease: Secondary | ICD-10-CM | POA: Diagnosis not present

## 2018-06-21 DIAGNOSIS — N2581 Secondary hyperparathyroidism of renal origin: Secondary | ICD-10-CM | POA: Diagnosis not present

## 2018-06-21 DIAGNOSIS — E611 Iron deficiency: Secondary | ICD-10-CM | POA: Diagnosis not present

## 2018-06-23 DIAGNOSIS — N2581 Secondary hyperparathyroidism of renal origin: Secondary | ICD-10-CM | POA: Diagnosis not present

## 2018-06-23 DIAGNOSIS — Z992 Dependence on renal dialysis: Secondary | ICD-10-CM | POA: Diagnosis not present

## 2018-06-23 DIAGNOSIS — N186 End stage renal disease: Secondary | ICD-10-CM | POA: Diagnosis not present

## 2018-06-23 DIAGNOSIS — E611 Iron deficiency: Secondary | ICD-10-CM | POA: Diagnosis not present

## 2018-06-26 DIAGNOSIS — N2581 Secondary hyperparathyroidism of renal origin: Secondary | ICD-10-CM | POA: Diagnosis not present

## 2018-06-26 DIAGNOSIS — Z992 Dependence on renal dialysis: Secondary | ICD-10-CM | POA: Diagnosis not present

## 2018-06-26 DIAGNOSIS — N186 End stage renal disease: Secondary | ICD-10-CM | POA: Diagnosis not present

## 2018-06-26 DIAGNOSIS — E611 Iron deficiency: Secondary | ICD-10-CM | POA: Diagnosis not present

## 2018-06-28 DIAGNOSIS — N2581 Secondary hyperparathyroidism of renal origin: Secondary | ICD-10-CM | POA: Diagnosis not present

## 2018-06-28 DIAGNOSIS — Z992 Dependence on renal dialysis: Secondary | ICD-10-CM | POA: Diagnosis not present

## 2018-06-28 DIAGNOSIS — E611 Iron deficiency: Secondary | ICD-10-CM | POA: Diagnosis not present

## 2018-06-28 DIAGNOSIS — N186 End stage renal disease: Secondary | ICD-10-CM | POA: Diagnosis not present

## 2018-06-30 DIAGNOSIS — N2581 Secondary hyperparathyroidism of renal origin: Secondary | ICD-10-CM | POA: Diagnosis not present

## 2018-06-30 DIAGNOSIS — Z992 Dependence on renal dialysis: Secondary | ICD-10-CM | POA: Diagnosis not present

## 2018-06-30 DIAGNOSIS — E611 Iron deficiency: Secondary | ICD-10-CM | POA: Diagnosis not present

## 2018-06-30 DIAGNOSIS — N186 End stage renal disease: Secondary | ICD-10-CM | POA: Diagnosis not present

## 2018-07-03 DIAGNOSIS — E611 Iron deficiency: Secondary | ICD-10-CM | POA: Diagnosis not present

## 2018-07-03 DIAGNOSIS — N186 End stage renal disease: Secondary | ICD-10-CM | POA: Diagnosis not present

## 2018-07-03 DIAGNOSIS — N2581 Secondary hyperparathyroidism of renal origin: Secondary | ICD-10-CM | POA: Diagnosis not present

## 2018-07-03 DIAGNOSIS — Z992 Dependence on renal dialysis: Secondary | ICD-10-CM | POA: Diagnosis not present

## 2018-07-05 DIAGNOSIS — N186 End stage renal disease: Secondary | ICD-10-CM | POA: Diagnosis not present

## 2018-07-05 DIAGNOSIS — E611 Iron deficiency: Secondary | ICD-10-CM | POA: Diagnosis not present

## 2018-07-05 DIAGNOSIS — Z992 Dependence on renal dialysis: Secondary | ICD-10-CM | POA: Diagnosis not present

## 2018-07-05 DIAGNOSIS — N2581 Secondary hyperparathyroidism of renal origin: Secondary | ICD-10-CM | POA: Diagnosis not present

## 2018-07-07 DIAGNOSIS — Z992 Dependence on renal dialysis: Secondary | ICD-10-CM | POA: Diagnosis not present

## 2018-07-07 DIAGNOSIS — N2581 Secondary hyperparathyroidism of renal origin: Secondary | ICD-10-CM | POA: Diagnosis not present

## 2018-07-07 DIAGNOSIS — E611 Iron deficiency: Secondary | ICD-10-CM | POA: Diagnosis not present

## 2018-07-07 DIAGNOSIS — N186 End stage renal disease: Secondary | ICD-10-CM | POA: Diagnosis not present

## 2018-07-09 ENCOUNTER — Telehealth: Payer: Self-pay | Admitting: Pharmacist

## 2018-07-09 NOTE — Telephone Encounter (Signed)
1. Do you currently have a fever? No 2. Have you recently travelled on a cruise, internationally, or to [place], [place], [place], [place], [place], or [place], [place] ([employer]) ? No 3. Have you been in contact with someone that is currently pending confirmation of Covid19 testing or has been confirmed to have the Covid19 virus?  No 4. Are you currently experiencing fatigue or cough? No  Pt. Advised that we are restricting visitors at this time and anyone present in the vehicle should meet the above criteria as well. Advised that visit will be at curbside for finger stick ONLY and will receive call with instructions. 

## 2018-07-10 ENCOUNTER — Telehealth: Payer: Self-pay | Admitting: *Deleted

## 2018-07-10 ENCOUNTER — Ambulatory Visit (INDEPENDENT_AMBULATORY_CARE_PROVIDER_SITE_OTHER): Payer: Medicare Other | Admitting: *Deleted

## 2018-07-10 DIAGNOSIS — Z992 Dependence on renal dialysis: Secondary | ICD-10-CM | POA: Diagnosis not present

## 2018-07-10 DIAGNOSIS — T82868D Thrombosis of vascular prosthetic devices, implants and grafts, subsequent encounter: Secondary | ICD-10-CM | POA: Diagnosis not present

## 2018-07-10 DIAGNOSIS — Z954 Presence of other heart-valve replacement: Secondary | ICD-10-CM | POA: Diagnosis not present

## 2018-07-10 DIAGNOSIS — E611 Iron deficiency: Secondary | ICD-10-CM | POA: Diagnosis not present

## 2018-07-10 DIAGNOSIS — Z5181 Encounter for therapeutic drug level monitoring: Secondary | ICD-10-CM | POA: Diagnosis not present

## 2018-07-10 DIAGNOSIS — N186 End stage renal disease: Secondary | ICD-10-CM | POA: Diagnosis not present

## 2018-07-10 DIAGNOSIS — N2581 Secondary hyperparathyroidism of renal origin: Secondary | ICD-10-CM | POA: Diagnosis not present

## 2018-07-10 LAB — POCT INR: INR: 5.1 — AB (ref 2.0–3.0)

## 2018-07-10 NOTE — Patient Instructions (Signed)
Hold coumadin tonight and tomorrow night then decrease dose to 1 tablet daily except 1/2 tablet this Thursday then on Sundays and Wednesdays. Recheck in 1 week.

## 2018-07-10 NOTE — Telephone Encounter (Signed)
Called pt to reschedule d/t covid19. Pt denies chest pain, SOB, swelling, wt gain and passing out spells. Pt voiced understanding.

## 2018-07-12 ENCOUNTER — Ambulatory Visit: Payer: Self-pay | Admitting: Internal Medicine

## 2018-07-12 DIAGNOSIS — N2581 Secondary hyperparathyroidism of renal origin: Secondary | ICD-10-CM | POA: Diagnosis not present

## 2018-07-12 DIAGNOSIS — Z992 Dependence on renal dialysis: Secondary | ICD-10-CM | POA: Diagnosis not present

## 2018-07-12 DIAGNOSIS — N186 End stage renal disease: Secondary | ICD-10-CM | POA: Diagnosis not present

## 2018-07-12 DIAGNOSIS — E611 Iron deficiency: Secondary | ICD-10-CM | POA: Diagnosis not present

## 2018-07-17 DIAGNOSIS — N2581 Secondary hyperparathyroidism of renal origin: Secondary | ICD-10-CM | POA: Diagnosis not present

## 2018-07-17 DIAGNOSIS — E611 Iron deficiency: Secondary | ICD-10-CM | POA: Diagnosis not present

## 2018-07-17 DIAGNOSIS — N186 End stage renal disease: Secondary | ICD-10-CM | POA: Diagnosis not present

## 2018-07-17 DIAGNOSIS — Z992 Dependence on renal dialysis: Secondary | ICD-10-CM | POA: Diagnosis not present

## 2018-07-18 DIAGNOSIS — L089 Local infection of the skin and subcutaneous tissue, unspecified: Secondary | ICD-10-CM | POA: Diagnosis not present

## 2018-07-18 DIAGNOSIS — S31109S Unspecified open wound of abdominal wall, unspecified quadrant without penetration into peritoneal cavity, sequela: Secondary | ICD-10-CM | POA: Diagnosis not present

## 2018-07-19 ENCOUNTER — Other Ambulatory Visit: Payer: Self-pay

## 2018-07-19 ENCOUNTER — Ambulatory Visit (INDEPENDENT_AMBULATORY_CARE_PROVIDER_SITE_OTHER): Payer: Medicare Other | Admitting: *Deleted

## 2018-07-19 DIAGNOSIS — N2581 Secondary hyperparathyroidism of renal origin: Secondary | ICD-10-CM | POA: Diagnosis not present

## 2018-07-19 DIAGNOSIS — Z5181 Encounter for therapeutic drug level monitoring: Secondary | ICD-10-CM

## 2018-07-19 DIAGNOSIS — Z954 Presence of other heart-valve replacement: Secondary | ICD-10-CM

## 2018-07-19 DIAGNOSIS — T82868D Thrombosis of vascular prosthetic devices, implants and grafts, subsequent encounter: Secondary | ICD-10-CM

## 2018-07-19 DIAGNOSIS — Z992 Dependence on renal dialysis: Secondary | ICD-10-CM | POA: Diagnosis not present

## 2018-07-19 DIAGNOSIS — D509 Iron deficiency anemia, unspecified: Secondary | ICD-10-CM | POA: Diagnosis not present

## 2018-07-19 DIAGNOSIS — E611 Iron deficiency: Secondary | ICD-10-CM | POA: Diagnosis not present

## 2018-07-19 DIAGNOSIS — N186 End stage renal disease: Secondary | ICD-10-CM | POA: Diagnosis not present

## 2018-07-19 LAB — POCT INR: INR: 2.6 (ref 2.0–3.0)

## 2018-07-19 NOTE — Patient Instructions (Signed)
Continue coumadin 1 tablet daily except 1/2 tablet on Sundays and Wednesdays. Recheck in 2 week.

## 2018-07-21 DIAGNOSIS — E611 Iron deficiency: Secondary | ICD-10-CM | POA: Diagnosis not present

## 2018-07-21 DIAGNOSIS — D509 Iron deficiency anemia, unspecified: Secondary | ICD-10-CM | POA: Diagnosis not present

## 2018-07-21 DIAGNOSIS — Z992 Dependence on renal dialysis: Secondary | ICD-10-CM | POA: Diagnosis not present

## 2018-07-21 DIAGNOSIS — N186 End stage renal disease: Secondary | ICD-10-CM | POA: Diagnosis not present

## 2018-07-21 DIAGNOSIS — N2581 Secondary hyperparathyroidism of renal origin: Secondary | ICD-10-CM | POA: Diagnosis not present

## 2018-07-22 ENCOUNTER — Emergency Department (HOSPITAL_COMMUNITY)
Admission: EM | Admit: 2018-07-22 | Discharge: 2018-07-23 | Disposition: A | Discharge status: Home / Self Care | Payer: Medicare Other | Attending: Emergency Medicine | Admitting: Emergency Medicine

## 2018-07-22 DIAGNOSIS — K8020 Calculus of gallbladder without cholecystitis without obstruction: Secondary | ICD-10-CM | POA: Diagnosis not present

## 2018-07-22 DIAGNOSIS — R319 Hematuria, unspecified: Secondary | ICD-10-CM | POA: Diagnosis not present

## 2018-07-22 DIAGNOSIS — N2881 Hypertrophy of kidney: Secondary | ICD-10-CM | POA: Diagnosis not present

## 2018-07-22 DIAGNOSIS — I132 Hypertensive heart and chronic kidney disease with heart failure and with stage 5 chronic kidney disease, or end stage renal disease: Secondary | ICD-10-CM | POA: Insufficient documentation

## 2018-07-22 DIAGNOSIS — N281 Cyst of kidney, acquired: Secondary | ICD-10-CM | POA: Diagnosis not present

## 2018-07-22 DIAGNOSIS — I5022 Chronic systolic (congestive) heart failure: Secondary | ICD-10-CM | POA: Insufficient documentation

## 2018-07-22 DIAGNOSIS — I700 Atherosclerosis of aorta: Secondary | ICD-10-CM | POA: Diagnosis not present

## 2018-07-22 DIAGNOSIS — F17220 Nicotine dependence, chewing tobacco, uncomplicated: Secondary | ICD-10-CM | POA: Insufficient documentation

## 2018-07-22 DIAGNOSIS — Z79899 Other long term (current) drug therapy: Secondary | ICD-10-CM | POA: Insufficient documentation

## 2018-07-22 DIAGNOSIS — K429 Umbilical hernia without obstruction or gangrene: Secondary | ICD-10-CM | POA: Diagnosis not present

## 2018-07-22 DIAGNOSIS — K5792 Diverticulitis of intestine, part unspecified, without perforation or abscess without bleeding: Secondary | ICD-10-CM | POA: Insufficient documentation

## 2018-07-22 DIAGNOSIS — I10 Essential (primary) hypertension: Secondary | ICD-10-CM | POA: Diagnosis not present

## 2018-07-22 DIAGNOSIS — N186 End stage renal disease: Secondary | ICD-10-CM | POA: Insufficient documentation

## 2018-07-22 DIAGNOSIS — Z7901 Long term (current) use of anticoagulants: Secondary | ICD-10-CM | POA: Insufficient documentation

## 2018-07-22 DIAGNOSIS — I2510 Atherosclerotic heart disease of native coronary artery without angina pectoris: Secondary | ICD-10-CM | POA: Diagnosis not present

## 2018-07-22 DIAGNOSIS — Z7982 Long term (current) use of aspirin: Secondary | ICD-10-CM | POA: Diagnosis not present

## 2018-07-22 DIAGNOSIS — N3289 Other specified disorders of bladder: Secondary | ICD-10-CM | POA: Diagnosis not present

## 2018-07-22 DIAGNOSIS — R58 Hemorrhage, not elsewhere classified: Secondary | ICD-10-CM | POA: Diagnosis not present

## 2018-07-22 DIAGNOSIS — N5089 Other specified disorders of the male genital organs: Secondary | ICD-10-CM | POA: Diagnosis not present

## 2018-07-22 DIAGNOSIS — Z951 Presence of aortocoronary bypass graft: Secondary | ICD-10-CM | POA: Diagnosis not present

## 2018-07-22 DIAGNOSIS — Z992 Dependence on renal dialysis: Secondary | ICD-10-CM | POA: Diagnosis not present

## 2018-07-22 DIAGNOSIS — R52 Pain, unspecified: Secondary | ICD-10-CM | POA: Diagnosis not present

## 2018-07-22 DIAGNOSIS — N62 Hypertrophy of breast: Secondary | ICD-10-CM | POA: Diagnosis not present

## 2018-07-22 NOTE — ED Triage Notes (Signed)
Pt brought in by [facility] after he began urinating dark red blood. Pt on HD and states last dialysis was yesterday. Pt states he passed several clots this evening. States he has pain that starts in R groin and "travels across his stomach".

## 2018-07-23 ENCOUNTER — Encounter (HOSPITAL_COMMUNITY): Payer: Self-pay | Admitting: Emergency Medicine

## 2018-07-23 ENCOUNTER — Other Ambulatory Visit: Payer: Self-pay

## 2018-07-23 ENCOUNTER — Emergency Department (HOSPITAL_COMMUNITY): Payer: Medicare Other

## 2018-07-23 DIAGNOSIS — N3289 Other specified disorders of bladder: Secondary | ICD-10-CM | POA: Diagnosis not present

## 2018-07-23 DIAGNOSIS — I700 Atherosclerosis of aorta: Secondary | ICD-10-CM | POA: Diagnosis not present

## 2018-07-23 DIAGNOSIS — I2510 Atherosclerotic heart disease of native coronary artery without angina pectoris: Secondary | ICD-10-CM | POA: Diagnosis not present

## 2018-07-23 DIAGNOSIS — N281 Cyst of kidney, acquired: Secondary | ICD-10-CM | POA: Diagnosis not present

## 2018-07-23 DIAGNOSIS — K8020 Calculus of gallbladder without cholecystitis without obstruction: Secondary | ICD-10-CM | POA: Diagnosis not present

## 2018-07-23 DIAGNOSIS — N5089 Other specified disorders of the male genital organs: Secondary | ICD-10-CM | POA: Diagnosis not present

## 2018-07-23 DIAGNOSIS — N62 Hypertrophy of breast: Secondary | ICD-10-CM | POA: Diagnosis not present

## 2018-07-23 DIAGNOSIS — N2881 Hypertrophy of kidney: Secondary | ICD-10-CM | POA: Diagnosis not present

## 2018-07-23 DIAGNOSIS — K429 Umbilical hernia without obstruction or gangrene: Secondary | ICD-10-CM | POA: Diagnosis not present

## 2018-07-23 DIAGNOSIS — R319 Hematuria, unspecified: Secondary | ICD-10-CM | POA: Diagnosis not present

## 2018-07-23 LAB — BASIC METABOLIC PANEL
Anion gap: 17 — ABNORMAL HIGH (ref 5–15)
BUN: 52 mg/dL — ABNORMAL HIGH (ref 6–20)
CO2: 24 mmol/L (ref 22–32)
Calcium: 8.4 mg/dL — ABNORMAL LOW (ref 8.9–10.3)
Chloride: 99 mmol/L (ref 98–111)
Creatinine, Ser: 9.29 mg/dL — ABNORMAL HIGH (ref 0.61–1.24)
GFR calc Af Amer: 7 mL/min — ABNORMAL LOW (ref >60)
GFR calc non Af Amer: 6 mL/min — ABNORMAL LOW (ref >60)
Glucose, Bld: 105 mg/dL — ABNORMAL HIGH (ref 70–99)
Potassium: 3.6 mmol/L (ref 3.5–5.1)
Sodium: 140 mmol/L (ref 135–145)

## 2018-07-23 LAB — PROTIME-INR
INR: 2.3 — ABNORMAL HIGH (ref 0.8–1.2)
Prothrombin Time: 25.4 seconds — ABNORMAL HIGH (ref 11.4–15.2)

## 2018-07-23 LAB — CBC
HCT: 43 % (ref 39.0–52.0)
Hemoglobin: 12.9 g/dL — ABNORMAL LOW (ref 13.0–17.0)
MCH: 25.3 pg — ABNORMAL LOW (ref 26.0–34.0)
MCHC: 30 g/dL (ref 30.0–36.0)
MCV: 84.3 fL (ref 80.0–100.0)
Platelets: 294 10*3/uL (ref 150–400)
RBC: 5.1 MIL/uL (ref 4.22–5.81)
RDW: 15.7 % — ABNORMAL HIGH (ref 11.5–15.5)
WBC: 10.6 10*3/uL — ABNORMAL HIGH (ref 4.0–10.5)
nRBC: 0 % (ref 0.0–0.2)

## 2018-07-23 MED ORDER — CEPHALEXIN 500 MG PO CAPS: 500.0000 mg | ORAL_CAPSULE | Freq: Once | ORAL | Status: DC

## 2018-07-23 MED ORDER — HYDROCODONE-ACETAMINOPHEN 5-325 MG PO TABS: 1.0000 | ORAL_TABLET | ORAL | 0 refills | Status: DC | PRN

## 2018-07-23 MED ORDER — METRONIDAZOLE 500 MG PO TABS
500.0000 mg | ORAL_TABLET | Freq: Once | ORAL | Status: AC
  Administered 2018-07-23: 500 mg via ORAL
  Filled 2018-07-23: qty 1

## 2018-07-23 MED ORDER — METRONIDAZOLE 500 MG PO TABS: 500.0000 mg | ORAL_TABLET | Freq: Three times a day (TID) | ORAL | 0 refills | Status: DC

## 2018-07-23 MED ORDER — ONDANSETRON HCL 4 MG/2ML IJ SOLN
4.0000 mg | Freq: Once | INTRAMUSCULAR | Status: AC
  Administered 2018-07-23: 4 mg via INTRAVENOUS
  Filled 2018-07-23: qty 2

## 2018-07-23 MED ORDER — BELLADONNA ALKALOIDS-OPIUM 16.2-60 MG RE SUPP
1.0000 | Freq: Once | RECTAL | Status: DC
  Filled 2018-07-23: qty 1

## 2018-07-23 MED ORDER — MORPHINE SULFATE (PF) 4 MG/ML IV SOLN
4.0000 mg | Freq: Once | INTRAVENOUS | Status: AC
  Administered 2018-07-23: 4 mg via INTRAVENOUS
  Filled 2018-07-23: qty 1

## 2018-07-23 MED ORDER — CIPROFLOXACIN HCL 500 MG PO TABS: 500.0000 mg | ORAL_TABLET | Freq: Every day | ORAL | 0 refills | Status: DC

## 2018-07-23 MED ORDER — CIPROFLOXACIN HCL 250 MG PO TABS
500.0000 mg | ORAL_TABLET | Freq: Once | ORAL | Status: AC
  Administered 2018-07-23: 04:00:00 500 mg via ORAL
  Filled 2018-07-23: qty 2

## 2018-07-23 NOTE — ED Provider Notes (Signed)
[facility] EMERGENCY DEPARTMENT Provider Note   CSN: [id number] Arrival date & time: 07/22/18  2355    History   Chief Complaint Chief Complaint  Patient presents with   Hematuria    HPI [name] [name] is a 53 y.o. male.     Patient presents to the emergency department for evaluation of hematuria and bladder pain.  Patient reports that he had been feeling fine throughout the day but this evening started to notice urinary urgency.  He went to the bathroom and there was blood in the urine.  He reports that he had multiple episodes back to back to feeling like he needed to urinate with increased bladder pain.  He reports small amounts of urine coming out that is bloody with clots.     Past Medical History:  Diagnosis Date   Aneurysm ([place])     Right arm fistula 3 aneurysms 2011,   plans to have a new procedure   Angina    Chest discomfort    CHF (congestive heart failure) ([place])    Coronary artery disease    Coronary artery disease involving native coronary artery of native heart without angina pectoris    Dialysis patient [facility])    Dysrhythmia    ESRD (end stage renal disease) ([place])    on hemodialysis T_T_[id number]   Hypertension    Leg pain    Mitral regurgitation    Morbid obesity (HCC)    Orthostatic hypotension    Overweight(278.02)    Renal insufficiency    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/2017   Coronary artery disease involving native coronary artery of native heart without angina pectoris    Chronic periodontitis 08/07/2017   Abnormal echocardiogram    Mitral regurgitation    Severe aortic insufficiency    Acute on chronic combined systolic and diastolic heart failure (HCC)    Systolic CHF ([place]) 91/79/1505   AV graft thrombosis, subsequent encounter 07/28/2017   Dermatitis 04/06/2016   End stage renal disease ([place]) 12/22/2011   Sinus tachycardia    ESRD (end stage renal disease) ([place])    Orthostatic hypotension    Chest discomfort    Morbid obesity ([place])    Syncope    Aneurysm ([place])     Past Surgical History:  Procedure Laterality Date   A/V FISTULAGRAM Left 06/30/2016   Procedure: A/V Fistulagram;  Surgeon: Algernon Huxley, MD;  Location: [place] [facility];  Service: Cardiovascular;  Laterality: Left;   AORTIC VALVE REPLACEMENT N/A 09/01/2017   Procedure: AORTIC VALVE REPLACEMENT (AVR);  Surgeon: [name], MD;  Location: [place];  Service: Open Heart Surgery;  Laterality: N/A;   ARTERIOVENOUS GRAFT PLACEMENT     AV FISTULA PLACEMENT     AV FISTULA PLACEMENT Left 11/18/2015   Procedure: ARTERIOVENOUS (AV) FISTULA CREATION ( RADIOCEPHALIC );  Surgeon: Algernon Huxley, MD;  Location: [facility];  Service: Vascular;  Laterality: Left;   AV FISTULA PLACEMENT Left 03/23/2016   Procedure: ARTERIOVENOUS (AV) FISTULA CREATION ( REVISION );  Surgeon: Algernon Huxley, MD;  Location: [facility];  Service: Vascular;  Laterality: Left;   [place] REMOVAL Left 03/23/2016   Procedure: REMOVAL OF ARTERIOVENOUS GORETEX GRAFT ([place]);  Surgeon: Algernon Huxley, MD;  Location: [facility];  Service: Vascular;  Laterality: Left;   CENTRAL LINE INSERTION Right 09/01/2017   Procedure: FLOROSCOPY GUIDED PLACEMENT OF RIGHT FEMORAL CENTRAL LINE TIMES TWO  AND PLACEMENT OF SWAN GANZ CATHETER;  Surgeon: [name], MD;  Location: [place];  Service: Open  Heart Surgery;  Laterality: Right;   CORONARY ARTERY BYPASS GRAFT N/A 09/01/2017   Procedure: CORONARY ARTERY BYPASS GRAFTING (CABG) X 1, USING RIGHT GREATER SAPHENOUS VEIN HARVESTED ENDOSCOPICALLY;  Surgeon: [name], MD;  Location: [place];  Service: Open Heart Surgery;  Laterality: N/A;   DIALYSIS/PERMA CATHETER REMOVAL N/A 08/01/2016   Procedure: Dialysis/Perma Catheter Removal;  Surgeon: Algernon Huxley, MD;  Location: [place] [facility];  Service: Cardiovascular;  Laterality: N/A;   HERNIA REPAIR     INSERTION OF DIALYSIS CATHETER  03/01/2011   Procedure: INSERTION OF DIALYSIS CATHETER;  Surgeon: Elam Dutch, MD;  Location: [place];  Service: Vascular;  Laterality: Left;  Exchange of Dialysis Catheter to 27cm 15Fr. Arrow Catheter   INSERTION OF DIALYSIS CATHETER  09/01/2017   Procedure: PLACEMENT OF TRIALYSIS SHORT TERM DIALYSIS CATHETER;  Surgeon: [name], MD;  Location: [facility] OR;  Service: Open Heart Surgery;;   MULTIPLE EXTRACTIONS WITH ALVEOLOPLASTY N/A 08/07/2017   Procedure: Extraction of tooth #2, 7,8,13,14,15,17, and 31 with alveoloplasty and gross debridement of remaining teeth;  Surgeon: [name], DDS;  Location: [place];  Service: Oral Surgery;  Laterality: N/A;   PERIPHERAL VASCULAR CATHETERIZATION N/A 09/01/2014   Procedure: A/V Shuntogram/Fistulagram;  Surgeon: Algernon Huxley, MD;  Location: [place] [facility];  Service: Cardiovascular;  Laterality: N/A;   PERIPHERAL VASCULAR CATHETERIZATION Right 09/01/2014   Procedure: Thrombectomy;  Surgeon: Algernon Huxley, MD;  Location: [place] [facility];  Service: Cardiovascular;  Laterality: Right;   PERIPHERAL VASCULAR CATHETERIZATION Right 09/01/2014   Procedure: A/V Shunt Intervention;  Surgeon: Algernon Huxley, MD;  Location: [place] [facility];  Service: Cardiovascular;  Laterality: Right;   PERIPHERAL VASCULAR CATHETERIZATION N/A 09/17/2014   Procedure: A/V Shuntogram/Fistulagram;  Surgeon: Algernon Huxley, MD;   Location: [place] [facility];  Service: Cardiovascular;  Laterality: N/A;   PERIPHERAL VASCULAR CATHETERIZATION Right 10/17/2014   Procedure: Thrombectomy;  Surgeon: Katha Cabal, MD;  Location: [place] [facility];  Service: Cardiovascular;  Laterality: Right;   PERIPHERAL VASCULAR CATHETERIZATION N/A 10/17/2014   Procedure: A/V Shuntogram/Fistulagram;  Surgeon: Katha Cabal, MD;  Location: [place] [facility];  Service: Cardiovascular;  Laterality: N/A;   PERIPHERAL VASCULAR CATHETERIZATION N/A 10/17/2014   Procedure: A/V Shunt Intervention;  Surgeon: Katha Cabal, MD;  Location: [place] [facility];  Service: Cardiovascular;  Laterality: N/A;   PERIPHERAL VASCULAR CATHETERIZATION Right 11/04/2014   Procedure: Thrombectomy;  Surgeon: Katha Cabal, MD;  Location: [place] [facility];  Service: Cardiovascular;  Laterality: Right;   PERIPHERAL VASCULAR CATHETERIZATION Left 11/04/2014   Procedure: Visceral Venography;  Surgeon: Katha Cabal, MD;  Location: [place] [facility];  Service: Cardiovascular;  Laterality: Left;   PERIPHERAL VASCULAR CATHETERIZATION Right 11/27/2014   Procedure: A/V Shuntogram/Fistulagram;  Surgeon: Algernon Huxley, MD;  Location: [place] [facility];  Service: Cardiovascular;  Laterality: Right;   PERIPHERAL VASCULAR CATHETERIZATION Right 11/27/2014   Procedure: A/V Shunt Intervention;  Surgeon: Algernon Huxley, MD;  Location: [place] [facility];  Service: Cardiovascular;  Laterality: Right;   PERIPHERAL VASCULAR CATHETERIZATION Right 12/31/2014   Procedure: Thrombectomy;  Surgeon: Algernon Huxley, MD;  Location: [place] [facility];  Service: Cardiovascular;  Laterality: Right;   PERIPHERAL VASCULAR CATHETERIZATION Right 01/12/2015   Procedure: A/V Shuntogram/Fistulagram;  Surgeon: Algernon Huxley, MD;  Location: [place] [facility];  Service: Cardiovascular;  Laterality: Right;   PERIPHERAL VASCULAR CATHETERIZATION N/A 01/12/2015   Procedure: A/V Shunt  Intervention;  Surgeon: Algernon Huxley, MD;  Location: [place] [facility];  Service: Cardiovascular;  Laterality: N/A;   PERIPHERAL VASCULAR CATHETERIZATION Right 01/28/2015   Procedure: Thrombectomy;  Surgeon: Algernon Huxley, MD;  Location: [place] [facility];  Service: Cardiovascular;  Laterality: Right;   PERIPHERAL VASCULAR CATHETERIZATION N/A 06/08/2015   Procedure: A/V Shuntogram/Fistulagram;  Surgeon: Algernon Huxley, MD;  Location: [place] [facility];  Service: Cardiovascular;  Laterality: N/A;   PERIPHERAL VASCULAR CATHETERIZATION N/A 06/08/2015   Procedure: A/V Shunt Intervention;  Surgeon: Algernon Huxley, MD;  Location: [place] [facility];  Service: Cardiovascular;  Laterality: N/A;   PERIPHERAL VASCULAR CATHETERIZATION Right 07/06/2015   Procedure: A/V Shuntogram/Fistulagram;  Surgeon: Algernon Huxley, MD;  Location: [place] [facility];  Service: Cardiovascular;  Laterality: Right;   PERIPHERAL VASCULAR CATHETERIZATION N/A 07/06/2015   Procedure: A/V Shunt Intervention;  Surgeon: Algernon Huxley, MD;  Location: [place] [facility];  Service: Cardiovascular;  Laterality: N/A;   PERIPHERAL VASCULAR CATHETERIZATION N/A 08/04/2015   Procedure: graft declot;  Surgeon: Katha Cabal, MD;  Location: [place] [facility];  Service: Cardiovascular;  Laterality: N/A;   PERIPHERAL VASCULAR CATHETERIZATION Right 08/25/2015   Procedure: A/V Shuntogram/Fistulagram;  Surgeon: Katha Cabal, MD;  Location: [place] [facility];  Service: Cardiovascular;  Laterality: Right;   PERIPHERAL VASCULAR CATHETERIZATION N/A 11/04/2015   Procedure: Dialysis/Perma Catheter Insertion;  Surgeon: Algernon Huxley, MD;  Location: [place] [facility];  Service: Cardiovascular;  Laterality: N/A;   PERIPHERAL VASCULAR CATHETERIZATION Left 12/14/2015   Procedure: A/V Shuntogram/Fistulagram;  Surgeon: Algernon Huxley, MD;  Location: [place] [facility];  Service: Cardiovascular;  Laterality: Left;   PERIPHERAL VASCULAR  CATHETERIZATION N/A 12/14/2015   Procedure: A/V Shunt Intervention;  Surgeon: Algernon Huxley, MD;  Location: [place] [facility];  Service: Cardiovascular;  Laterality: N/A;   PERIPHERAL VASCULAR CATHETERIZATION N/A 12/17/2015   Procedure: Dialysis/Perma Catheter Insertion;  Surgeon: Algernon Huxley, MD;  Location: [place] [facility];  Service: Cardiovascular;  Laterality: N/A;   PERIPHERAL VASCULAR CATHETERIZATION Left 12/22/2015   Procedure: Dialysis/Perma Catheter Insertion;  Surgeon: Katha Cabal, MD;  Location: [place] [facility];  Service: Cardiovascular;  Laterality: Left;   PERIPHERAL VASCULAR CATHETERIZATION Left 02/29/2016   Procedure: A/V Shuntogram/Fistulagram;  Surgeon: Algernon Huxley, MD;  Location: [place] [facility];  Service: Cardiovascular;  Laterality: Left;   PERIPHERAL VASCULAR CATHETERIZATION N/A 02/29/2016   Procedure: A/V Shunt Intervention;  Surgeon: Algernon Huxley, MD;  Location: [place] [facility];  Service: Cardiovascular;  Laterality: N/A;   POCKET REVISION/RELOCATION N/A 01/01/2018   Procedure: POCKET REVISION/RELOCATION;  Surgeon: Evans Lance, MD;  Location: [place] [facility];  Service: Cardiovascular;  Laterality: N/A;   right arm graft     for dyalisis  RIGHT/LEFT HEART CATH AND CORONARY ANGIOGRAPHY N/A 08/02/2017   Procedure: RIGHT/LEFT HEART CATH AND CORONARY ANGIOGRAPHY;  Surgeon: Larey Dresser, MD;  Location: [place] [facility];  Service: Cardiovascular;  Laterality: N/A;   SUBQ ICD IMPLANT N/A 11/28/2017   Procedure: SUBQ ICD IMPLANT;  Surgeon: Evans Lance, MD;  Location: [place] [facility];  Service: Cardiovascular;  Laterality: N/A;   SUBQ ICD REVISION N/A 02/12/2018   Procedure: SUBQ ICD REVISION(REMOVAL);  Surgeon: Evans Lance, MD;  Location: [place] [facility];  Service: Cardiovascular;  Laterality: N/A;   TEE WITHOUT CARDIOVERSION N/A 07/31/2017   Procedure: TRANSESOPHAGEAL ECHOCARDIOGRAM (TEE);  Surgeon: Josue Hector, MD;   Location: [facility]OSCOPY;  Service: Cardiovascular;  Laterality: N/A;   TEE WITHOUT CARDIOVERSION N/A 09/01/2017   Procedure: TRANSESOPHAGEAL ECHOCARDIOGRAM (TEE);  Surgeon: [name], MD;  Location: [place];  Service: Open Heart Surgery;  Laterality: N/A;   THROMBECTOMY     THROMBECTOMY W/ EMBOLECTOMY  03/01/2011   Procedure: THROMBECTOMY ARTERIOVENOUS GORE-TEX GRAFT;  Surgeon: Elam Dutch, MD;  Location: [place];  Service: Vascular;  Laterality: Right;  Attempted Thrombectomy of Old  Right Upper Arm Arteriovenous gortex Graft. Insertion of new Arteriovenous Graft using 65mm x 50cm Gortex Stretch graft.    THROMBECTOMY W/ EMBOLECTOMY  07/11/2011   Procedure: THROMBECTOMY ARTERIOVENOUS GORE-TEX GRAFT;  Surgeon: Rosetta Posner, MD;  Location: [facility] OR;  Service: Vascular;  Laterality: Right;   UMBILICAL HERNIA REPAIR     VENOGRAM N/A 08/23/2011   Procedure: VENOGRAM;  Surgeon: [name], MD;  Location: [facility] CATH LAB;  Service: Cardiovascular;  Laterality: N/A;        Home Medications    Prior to Admission medications   Medication Sig Start Date End Date Taking? Authorizing Provider  acetaminophen-codeine (TYLENOL #3) 300-30 MG tablet Take 1-2 tablets by mouth every 6 (six) hours as needed for moderate pain. 01/05/18   Evans Lance, MD  amiodarone (PACERONE) 200 MG tablet Take 1 tablet (200 mg total) by mouth daily. 01/16/18   Evans Lance, MD  aspirin EC 81 MG EC tablet Take 1 tablet (81 mg total) by mouth daily. 09/11/17   Barrett, Erin R, PA-C  atorvastatin (LIPITOR) 80 MG tablet Take 1 tablet (80 mg total) by mouth daily at 6 PM. 10/31/17   Larey Dresser, MD  B Complex-C-Folic Acid (NEPHRO-VITE PO) Take 1 tablet by mouth daily.     [provider]  ciprofloxacin (CIPRO) 500 MG tablet Take 1 tablet (500 mg total) by mouth daily. Take after dialysis 07/23/18   Orpah Greek, MD  FOSRENOL 500 MG chewable tablet Chew 1,500-2,000 mg by mouth See admin instructions.  Take 2000 mg by mouth with meals and take 1500 mg by mouth with snacks 05/28/13   [provider]  HYDROcodone-acetaminophen (NORCO/VICODIN) 5-325 MG tablet Take 1-2 tablets by mouth every 4 (four) hours as needed. 07/23/18   Orpah Greek, MD  metoprolol tartrate (LOPRESSOR) 25 MG tablet TAKE 1 TABLET BY MOUTH TWICE DAILY 04/02/18   Herminio Commons, MD  metroNIDAZOLE (FLAGYL) 500 MG tablet Take 1 tablet (500 mg total) by mouth 3 (three) times daily. 07/23/18   Orpah Greek, MD  midodrine (PROAMATINE) 5 MG tablet Take 3 tablets (15 mg total) by mouth 3 (three) times daily with meals. 01/09/18   Evans Lance, MD  oxyCODONE (OXY IR/ROXICODONE) 5 MG immediate release tablet Take 1 tablet (5 mg total) by mouth  every 4 (four) hours as needed for severe pain. 09/10/17   Barrett, Erin R, PA-C  warfarin (COUMADIN) 5 MG tablet Take 1 tablet daily except 1/2 tablet on Tuesdays, Thursdays and Saturdays or as directed by coumadin clinic Patient taking differently: Take 5 mg by mouth daily.  09/14/17   Herminio Commons, MD    Family History Family History  Problem Relation Age of Onset   Hypertension Mother    Heart disease Mother    Hypertension Father    Heart disease Father     Social History Social History   Tobacco Use   Smoking status: Former Smoker    Types: Cigarettes    Last attempt to quit: 04/18/1994    Years since quitting: 24.2   Smokeless tobacco: Current User    Types: Snuff   Tobacco comment: dips 1/2 snuff per day times 25 years  Substance Use Topics   Alcohol use: No   Drug use: No     Allergies   Patient has no known allergies.   Review of Systems Review of Systems  Genitourinary: Positive for difficulty urinating and hematuria.  All other systems reviewed and are negative.    Physical Exam Updated Vital Signs BP (!) 144/86    Pulse (!) 102    Temp 97.7 F (36.5 C) (Oral)    Resp 18    Ht 6' (1.829 m)    Wt 133.8 kg     SpO2 98%    BMI 40.01 kg/m   Physical Exam Vitals signs and nursing note reviewed.  Constitutional:      General: He is not in acute distress.    Appearance: Normal appearance. He is well-developed.  HENT:     Head: Normocephalic and atraumatic.     Right Ear: Hearing normal.     Left Ear: Hearing normal.     Nose: Nose normal.  Eyes:     Conjunctiva/sclera: Conjunctivae normal.     Pupils: Pupils are equal, round, and reactive to light.  Neck:     Musculoskeletal: Normal range of motion and neck supple.  Cardiovascular:     Rate and Rhythm: Regular rhythm.     Heart sounds: S1 normal and S2 normal. No murmur. No friction rub. No gallop.   Pulmonary:     Effort: Pulmonary effort is normal. No respiratory distress.     Breath sounds: Normal breath sounds.  Chest:     Chest wall: No tenderness.  Abdominal:     General: Bowel sounds are normal.     Palpations: Abdomen is soft.     Tenderness: There is abdominal tenderness in the suprapubic area. There is no guarding or rebound. Negative signs include Murphy's sign and McBurney's sign.     Hernia: No hernia is present.  Musculoskeletal: Normal range of motion.  Skin:    General: Skin is warm and dry.     Findings: No rash.  Neurological:     Mental Status: He is alert and oriented to person, place, and time.     GCS: GCS eye subscore is 4. GCS verbal subscore is 5. GCS motor subscore is 6.     Cranial Nerves: No cranial nerve deficit.     Sensory: No sensory deficit.     Coordination: Coordination normal.  Psychiatric:        Speech: Speech normal.        Behavior: Behavior normal.        Thought Content: Thought content normal.  ED Treatments / Results  Labs (all labs ordered are listed, but only abnormal results are displayed) Labs Reviewed  CBC - Abnormal; Notable for the following components:      Result Value   WBC 10.6 (*)    Hemoglobin 12.9 (*)    MCH 25.3 (*)    RDW 15.7 (*)    All other components  within normal limits  BASIC METABOLIC PANEL - Abnormal; Notable for the following components:   Glucose, Bld 105 (*)    BUN 52 (*)    Creatinine, Ser 9.29 (*)    Calcium 8.4 (*)    GFR calc non Af Amer 6 (*)    GFR calc Af Amer 7 (*)    Anion gap 17 (*)    All other components within normal limits  PROTIME-INR - Abnormal; Notable for the following components:   Prothrombin Time 25.4 (*)    INR 2.3 (*)    All other components within normal limits  URINE CULTURE  URINALYSIS, ROUTINE W REFLEX MICROSCOPIC    EKG None  Radiology Ct Renal Stone Study  Result Date: 07/23/2018 CLINICAL DATA:  Hematuria. Abdominal and flank pain. EXAM: CT ABDOMEN AND PELVIS WITHOUT CONTRAST TECHNIQUE: Multidetector CT imaging of the abdomen and pelvis was performed following the standard protocol without IV contrast. COMPARISON:  None. FINDINGS: Lower chest: Dense coronary artery calcifications. Prosthetic aortic valve. No pleural effusion or focal airspace disease. Bilateral gynecomastia. Hepatobiliary: Small calcified gallstones. No evidence of gallbladder inflammation or abnormal distention. No focal hepatic abnormality on noncontrast exam. Pancreas: No ductal dilatation or inflammation. Spleen: Normal in size without focal abnormality. Adrenals/Urinary Tract: No adrenal nodule. Bilateral renal enlargement with near complete renal placement with innumerable cysts of varying sizes. Many of the cysts have wall calcification. No obvious hydronephrosis, however the renal collecting systems are slightly obscured by associated renal cysts. Ureters tentatively identified decompressed. Heterogeneous high density within the urinary bladder, blood clot versus bladder mass Stomach/Bowel: Colonic diverticulosis with faint pericolonic edema involving the proximal sigmoid colon, image 71 series 2 and image 65 series 5. No abscess or free air. Appendix not confidently visualized. No small bowel dilatation or inflammation. Stomach  is nondistended. Vascular/Lymphatic: Aortic and branch atherosclerosis. No aneurysm. No enlarged lymph nodes in the abdomen or pelvis. Reproductive: Prominent prostate gland spans 4.4 cm. Vas deferens calcifications. Other: Fat containing umbilical hernia. No intra-abdominal ascites, free air, or focal fluid collection. Musculoskeletal: Sequela of renal osteodystrophy with slight ground-glass appearance to the osseous structures. Mild bilateral sacroiliac joint widening also likely secondary to chronic renal disease. No acute osseous abnormalities. IMPRESSION: 1. Heterogeneous high density within the urinary bladder, may be blood clot versus bladder mass. Recommend correlation with cystoscopy. 2. Mild uncomplicated diverticulitis of the proximal sigmoid colon. 3. Findings consistent with polycystic kidney disease. 4. Incidental findings of gallstone and moderate-sized fat containing umbilical hernia. Aortic Atherosclerosis (ICD10-I70.0). Electronically Signed   By: Keith Rake M.D.   On: 07/23/2018 01:15    Procedures Procedures (including critical care time)  Medications Ordered in ED Medications  opium-belladonna (B&O SUPPRETTES) 16.2-60 MG suppository 1 suppository (1 suppository Rectal Refused 07/23/18 0018)  ciprofloxacin (CIPRO) tablet 500 mg (has no administration in time range)  metroNIDAZOLE (FLAGYL) tablet 500 mg (has no administration in time range)  morphine 4 MG/ML injection 4 mg (4 mg Intravenous Given 07/23/18 0018)  ondansetron (ZOFRAN) injection 4 mg (4 mg Intravenous Given 07/23/18 0018)     Initial Impression / Assessment and Plan /  ED Course  I have reviewed the triage vital signs and the nursing notes.  Pertinent labs & imaging results that were available during my care of the patient were reviewed by me and considered in my medical decision making (see chart for details).        Patient presents to the emergency department for evaluation of hematuria and bladder pain.   Patient is currently on dialysis.  He does not urinate very much, but tonight noticed an urge to urinate and went to the bathroom, noticing blood passage.  He has passed blood several times tonight.  He did not have any urinary symptoms prior to that.  Patient complaining of pain in the area of the bladder, has not had any fever, nausea or vomiting.  Blood work is unremarkable.  Hemoglobin is at his baseline.  No significant leukocytosis.  No electrolyte abnormality.  Patient is on Coumadin secondary to aortic valve replacement.  INR is appropriate.  Patient underwent CT renal stone study to further evaluate for hematuria.  He does have evidence of blood in his bladder.  Bladder is not significantly distended.  There is also evidence of uncomplicated diverticulitis.  Patient initiated on Cipro and Flagyl.  I did discuss the possibility of placing a Foley catheter in him and irrigating his bladder but he has declined this.  Patient is anxious to be discharged, wants to go home.  Patient will be given analgesia to use at home, given return precautions.  Final Clinical Impressions(s) / ED Diagnoses   Final diagnoses:  Hematuria, unspecified type  Diverticulitis    ED Discharge Orders         Ordered    ciprofloxacin (CIPRO) 500 MG tablet  Daily     07/23/18 0356    metroNIDAZOLE (FLAGYL) 500 MG tablet  3 times daily     07/23/18 0356    HYDROcodone-acetaminophen (NORCO/VICODIN) 5-325 MG tablet  Every 4 hours PRN     07/23/18 0356           Orpah Greek, MD 07/23/18 [phone]

## 2018-07-23 NOTE — ED Notes (Signed)
Pt still c/o abdominal pain. Offered B&O suppository to pt again but he refused again.

## 2018-07-24 ENCOUNTER — Telehealth: Payer: Self-pay | Admitting: Internal Medicine

## 2018-07-24 DIAGNOSIS — N186 End stage renal disease: Secondary | ICD-10-CM | POA: Diagnosis not present

## 2018-07-24 DIAGNOSIS — E611 Iron deficiency: Secondary | ICD-10-CM | POA: Diagnosis not present

## 2018-07-24 DIAGNOSIS — Z992 Dependence on renal dialysis: Secondary | ICD-10-CM | POA: Diagnosis not present

## 2018-07-24 DIAGNOSIS — D509 Iron deficiency anemia, unspecified: Secondary | ICD-10-CM | POA: Diagnosis not present

## 2018-07-24 DIAGNOSIS — N2581 Secondary hyperparathyroidism of renal origin: Secondary | ICD-10-CM | POA: Diagnosis not present

## 2018-07-24 MED FILL — Hydrocodone-Acetaminophen Tab 5-325 MG: ORAL | Qty: 6 | Status: AC

## 2018-07-24 NOTE — Telephone Encounter (Signed)
Returned call to wife.  Pt did not start antibiotics d/t fears of side effects.  Advised to start medications.  Wife will get on mychart and send me a message tomorrow.

## 2018-07-24 NOTE — Telephone Encounter (Signed)
New Message   Patients wife is calling on his behalf. She states that she took him to the emergency room because he was urinating blood. She said they was told that it was an infection. But she does not believe that its an infection. She states that the patient does not have a PCP but she wants Dr. Tanna Furry opinion as to what to do. He has no chest pain just pain in his lower abdomen and groin area .

## 2018-07-26 DIAGNOSIS — E611 Iron deficiency: Secondary | ICD-10-CM | POA: Diagnosis not present

## 2018-07-26 DIAGNOSIS — Z992 Dependence on renal dialysis: Secondary | ICD-10-CM | POA: Diagnosis not present

## 2018-07-26 DIAGNOSIS — N2581 Secondary hyperparathyroidism of renal origin: Secondary | ICD-10-CM | POA: Diagnosis not present

## 2018-07-26 DIAGNOSIS — N186 End stage renal disease: Secondary | ICD-10-CM | POA: Diagnosis not present

## 2018-07-26 DIAGNOSIS — D509 Iron deficiency anemia, unspecified: Secondary | ICD-10-CM | POA: Diagnosis not present

## 2018-07-28 DIAGNOSIS — N2581 Secondary hyperparathyroidism of renal origin: Secondary | ICD-10-CM | POA: Diagnosis not present

## 2018-07-28 DIAGNOSIS — Z992 Dependence on renal dialysis: Secondary | ICD-10-CM | POA: Diagnosis not present

## 2018-07-28 DIAGNOSIS — D509 Iron deficiency anemia, unspecified: Secondary | ICD-10-CM | POA: Diagnosis not present

## 2018-07-28 DIAGNOSIS — N186 End stage renal disease: Secondary | ICD-10-CM | POA: Diagnosis not present

## 2018-07-28 DIAGNOSIS — E611 Iron deficiency: Secondary | ICD-10-CM | POA: Diagnosis not present

## 2018-07-31 DIAGNOSIS — E611 Iron deficiency: Secondary | ICD-10-CM | POA: Diagnosis not present

## 2018-07-31 DIAGNOSIS — N186 End stage renal disease: Secondary | ICD-10-CM | POA: Diagnosis not present

## 2018-07-31 DIAGNOSIS — N2581 Secondary hyperparathyroidism of renal origin: Secondary | ICD-10-CM | POA: Diagnosis not present

## 2018-07-31 DIAGNOSIS — Z992 Dependence on renal dialysis: Secondary | ICD-10-CM | POA: Diagnosis not present

## 2018-07-31 DIAGNOSIS — D509 Iron deficiency anemia, unspecified: Secondary | ICD-10-CM | POA: Diagnosis not present

## 2018-08-01 ENCOUNTER — Inpatient Hospital Stay (HOSPITAL_COMMUNITY)
Admission: EM | Admit: 2018-08-01 | Discharge: 2018-08-07 | DRG: 555 | Disposition: A | Discharge status: Home / Self Care | Payer: Medicare Other | Attending: Internal Medicine | Admitting: Internal Medicine

## 2018-08-01 ENCOUNTER — Other Ambulatory Visit: Payer: Self-pay

## 2018-08-01 ENCOUNTER — Telehealth: Payer: Self-pay | Admitting: *Deleted

## 2018-08-01 ENCOUNTER — Emergency Department (HOSPITAL_COMMUNITY): Payer: Medicare Other

## 2018-08-01 ENCOUNTER — Encounter (HOSPITAL_COMMUNITY): Payer: Self-pay | Admitting: Emergency Medicine

## 2018-08-01 DIAGNOSIS — Z992 Dependence on renal dialysis: Secondary | ICD-10-CM

## 2018-08-01 DIAGNOSIS — M5489 Other dorsalgia: Secondary | ICD-10-CM | POA: Diagnosis not present

## 2018-08-01 DIAGNOSIS — Z8249 Family history of ischemic heart disease and other diseases of the circulatory system: Secondary | ICD-10-CM

## 2018-08-01 DIAGNOSIS — Z7982 Long term (current) use of aspirin: Secondary | ICD-10-CM | POA: Diagnosis not present

## 2018-08-01 DIAGNOSIS — S37019A Minor contusion of unspecified kidney, initial encounter: Secondary | ICD-10-CM

## 2018-08-01 DIAGNOSIS — D631 Anemia in chronic kidney disease: Secondary | ICD-10-CM | POA: Diagnosis not present

## 2018-08-01 DIAGNOSIS — Z7901 Long term (current) use of anticoagulants: Secondary | ICD-10-CM | POA: Diagnosis not present

## 2018-08-01 DIAGNOSIS — Q613 Polycystic kidney, unspecified: Secondary | ICD-10-CM | POA: Diagnosis not present

## 2018-08-01 DIAGNOSIS — Z95810 Presence of automatic (implantable) cardiac defibrillator: Secondary | ICD-10-CM | POA: Diagnosis not present

## 2018-08-01 DIAGNOSIS — S3681XA Injury of peritoneum, initial encounter: Secondary | ICD-10-CM | POA: Diagnosis not present

## 2018-08-01 DIAGNOSIS — X58XXXA Exposure to other specified factors, initial encounter: Secondary | ICD-10-CM | POA: Diagnosis not present

## 2018-08-01 DIAGNOSIS — D62 Acute posthemorrhagic anemia: Secondary | ICD-10-CM | POA: Diagnosis not present

## 2018-08-01 DIAGNOSIS — I509 Heart failure, unspecified: Secondary | ICD-10-CM | POA: Diagnosis not present

## 2018-08-01 DIAGNOSIS — Z87891 Personal history of nicotine dependence: Secondary | ICD-10-CM | POA: Diagnosis not present

## 2018-08-01 DIAGNOSIS — S37012A Minor contusion of left kidney, initial encounter: Secondary | ICD-10-CM | POA: Diagnosis present

## 2018-08-01 DIAGNOSIS — I132 Hypertensive heart and chronic kidney disease with heart failure and with stage 5 chronic kidney disease, or end stage renal disease: Secondary | ICD-10-CM | POA: Diagnosis present

## 2018-08-01 DIAGNOSIS — Z951 Presence of aortocoronary bypass graft: Secondary | ICD-10-CM | POA: Diagnosis not present

## 2018-08-01 DIAGNOSIS — N2581 Secondary hyperparathyroidism of renal origin: Secondary | ICD-10-CM | POA: Diagnosis not present

## 2018-08-01 DIAGNOSIS — R109 Unspecified abdominal pain: Secondary | ICD-10-CM | POA: Diagnosis not present

## 2018-08-01 DIAGNOSIS — Q612 Polycystic kidney, adult type: Secondary | ICD-10-CM

## 2018-08-01 DIAGNOSIS — R52 Pain, unspecified: Secondary | ICD-10-CM | POA: Diagnosis not present

## 2018-08-01 DIAGNOSIS — Z954 Presence of other heart-valve replacement: Secondary | ICD-10-CM

## 2018-08-01 DIAGNOSIS — Z952 Presence of prosthetic heart valve: Secondary | ICD-10-CM

## 2018-08-01 DIAGNOSIS — N186 End stage renal disease: Secondary | ICD-10-CM | POA: Diagnosis not present

## 2018-08-01 DIAGNOSIS — Z66 Do not resuscitate: Secondary | ICD-10-CM | POA: Diagnosis present

## 2018-08-01 DIAGNOSIS — I959 Hypotension, unspecified: Secondary | ICD-10-CM | POA: Diagnosis not present

## 2018-08-01 DIAGNOSIS — R079 Chest pain, unspecified: Secondary | ICD-10-CM | POA: Diagnosis not present

## 2018-08-01 DIAGNOSIS — D6832 Hemorrhagic disorder due to extrinsic circulating anticoagulants: Secondary | ICD-10-CM | POA: Diagnosis present

## 2018-08-01 DIAGNOSIS — I2510 Atherosclerotic heart disease of native coronary artery without angina pectoris: Secondary | ICD-10-CM | POA: Diagnosis present

## 2018-08-01 DIAGNOSIS — N281 Cyst of kidney, acquired: Secondary | ICD-10-CM | POA: Diagnosis not present

## 2018-08-01 DIAGNOSIS — R000 Tachycardia, unspecified: Secondary | ICD-10-CM | POA: Diagnosis not present

## 2018-08-01 DIAGNOSIS — T45515A Adverse effect of anticoagulants, initial encounter: Secondary | ICD-10-CM | POA: Diagnosis present

## 2018-08-01 DIAGNOSIS — Z6841 Body Mass Index (BMI) 40.0 and over, adult: Secondary | ICD-10-CM | POA: Diagnosis not present

## 2018-08-01 DIAGNOSIS — R310 Gross hematuria: Secondary | ICD-10-CM | POA: Diagnosis present

## 2018-08-01 DIAGNOSIS — F17290 Nicotine dependence, other tobacco product, uncomplicated: Secondary | ICD-10-CM | POA: Diagnosis present

## 2018-08-01 DIAGNOSIS — M7981 Nontraumatic hematoma of soft tissue: Principal | ICD-10-CM | POA: Diagnosis present

## 2018-08-01 DIAGNOSIS — N189 Chronic kidney disease, unspecified: Secondary | ICD-10-CM | POA: Diagnosis not present

## 2018-08-01 DIAGNOSIS — R791 Abnormal coagulation profile: Secondary | ICD-10-CM | POA: Diagnosis not present

## 2018-08-01 DIAGNOSIS — K5732 Diverticulitis of large intestine without perforation or abscess without bleeding: Secondary | ICD-10-CM | POA: Diagnosis not present

## 2018-08-01 DIAGNOSIS — R319 Hematuria, unspecified: Secondary | ICD-10-CM | POA: Diagnosis not present

## 2018-08-01 DIAGNOSIS — I4581 Long QT syndrome: Secondary | ICD-10-CM | POA: Diagnosis not present

## 2018-08-01 DIAGNOSIS — R34 Anuria and oliguria: Secondary | ICD-10-CM | POA: Diagnosis present

## 2018-08-01 DIAGNOSIS — R0602 Shortness of breath: Secondary | ICD-10-CM | POA: Diagnosis not present

## 2018-08-01 DIAGNOSIS — R069 Unspecified abnormalities of breathing: Secondary | ICD-10-CM | POA: Diagnosis not present

## 2018-08-01 DIAGNOSIS — K661 Hemoperitoneum: Secondary | ICD-10-CM | POA: Diagnosis not present

## 2018-08-01 DIAGNOSIS — I120 Hypertensive chronic kidney disease with stage 5 chronic kidney disease or end stage renal disease: Secondary | ICD-10-CM | POA: Diagnosis not present

## 2018-08-01 DIAGNOSIS — Z79899 Other long term (current) drug therapy: Secondary | ICD-10-CM | POA: Diagnosis not present

## 2018-08-01 DIAGNOSIS — I5022 Chronic systolic (congestive) heart failure: Secondary | ICD-10-CM | POA: Diagnosis not present

## 2018-08-01 LAB — CBC
HCT: 25.2 % — ABNORMAL LOW (ref 39.0–52.0)
Hemoglobin: 7.7 g/dL — ABNORMAL LOW (ref 13.0–17.0)
MCH: 25.6 pg — ABNORMAL LOW (ref 26.0–34.0)
MCHC: 30.6 g/dL (ref 30.0–36.0)
MCV: 83.7 fL (ref 80.0–100.0)
Platelets: 297 10*3/uL (ref 150–400)
RBC: 3.01 MIL/uL — ABNORMAL LOW (ref 4.22–5.81)
RDW: 15.8 % — ABNORMAL HIGH (ref 11.5–15.5)
WBC: 14.2 10*3/uL — ABNORMAL HIGH (ref 4.0–10.5)
nRBC: 0 % (ref 0.0–0.2)

## 2018-08-01 LAB — COMPREHENSIVE METABOLIC PANEL
ALT: 13 U/L (ref 0–44)
ALT: 12 U/L (ref 0–44)
AST: 28 U/L (ref 15–41)
AST: 24 U/L (ref 15–41)
Albumin: 2.5 g/dL — ABNORMAL LOW (ref 3.5–5.0)
Albumin: 2.3 g/dL — ABNORMAL LOW (ref 3.5–5.0)
Alkaline Phosphatase: 105 U/L (ref 38–126)
Alkaline Phosphatase: 103 U/L (ref 38–126)
Anion gap: 13 (ref 5–15)
Anion gap: 12 (ref 5–15)
BUN: 33 mg/dL — ABNORMAL HIGH (ref 6–20)
BUN: 33 mg/dL — ABNORMAL HIGH (ref 6–20)
CO2: 27 mmol/L (ref 22–32)
CO2: 28 mmol/L (ref 22–32)
Calcium: 7.9 mg/dL — ABNORMAL LOW (ref 8.9–10.3)
Calcium: 7.8 mg/dL — ABNORMAL LOW (ref 8.9–10.3)
Chloride: 97 mmol/L — ABNORMAL LOW (ref 98–111)
Chloride: 97 mmol/L — ABNORMAL LOW (ref 98–111)
Creatinine, Ser: 8.03 mg/dL — ABNORMAL HIGH (ref 0.61–1.24)
Creatinine, Ser: 7.95 mg/dL — ABNORMAL HIGH (ref 0.61–1.24)
GFR calc Af Amer: 8 mL/min — ABNORMAL LOW (ref >60)
GFR calc Af Amer: 8 mL/min — ABNORMAL LOW (ref >60)
GFR calc non Af Amer: 7 mL/min — ABNORMAL LOW (ref >60)
GFR calc non Af Amer: 7 mL/min — ABNORMAL LOW (ref >60)
Glucose, Bld: 110 mg/dL — ABNORMAL HIGH (ref 70–99)
Glucose, Bld: 107 mg/dL — ABNORMAL HIGH (ref 70–99)
Potassium: 3.4 mmol/L — ABNORMAL LOW (ref 3.5–5.1)
Potassium: 3.6 mmol/L (ref 3.5–5.1)
Sodium: 137 mmol/L (ref 135–145)
Sodium: 137 mmol/L (ref 135–145)
Total Bilirubin: 1.3 mg/dL — ABNORMAL HIGH (ref 0.3–1.2)
Total Bilirubin: 1.3 mg/dL — ABNORMAL HIGH (ref 0.3–1.2)
Total Protein: 7.3 g/dL (ref 6.5–8.1)
Total Protein: 7.4 g/dL (ref 6.5–8.1)

## 2018-08-01 LAB — CBC WITH DIFFERENTIAL/PLATELET
Abs Immature Granulocytes: 0.14 10*3/uL — ABNORMAL HIGH (ref 0.00–0.07)
Basophils Absolute: 0 10*3/uL (ref 0.0–0.1)
Basophils Relative: 0 %
Eosinophils Absolute: 0.1 10*3/uL (ref 0.0–0.5)
Eosinophils Relative: 1 %
HCT: 29.5 % — ABNORMAL LOW (ref 39.0–52.0)
Hemoglobin: 8.9 g/dL — ABNORMAL LOW (ref 13.0–17.0)
Immature Granulocytes: 1 %
Lymphocytes Relative: 4 %
Lymphs Abs: 0.6 10*3/uL — ABNORMAL LOW (ref 0.7–4.0)
MCH: 25.4 pg — ABNORMAL LOW (ref 26.0–34.0)
MCHC: 30.2 g/dL (ref 30.0–36.0)
MCV: 84 fL (ref 80.0–100.0)
Monocytes Absolute: 1 10*3/uL (ref 0.1–1.0)
Monocytes Relative: 7 %
Neutro Abs: 13.2 10*3/uL — ABNORMAL HIGH (ref 1.7–7.7)
Neutrophils Relative %: 87 %
Platelets: 356 10*3/uL (ref 150–400)
RBC: 3.51 MIL/uL — ABNORMAL LOW (ref 4.22–5.81)
RDW: 16 % — ABNORMAL HIGH (ref 11.5–15.5)
WBC: 15 10*3/uL — ABNORMAL HIGH (ref 4.0–10.5)
nRBC: 0 % (ref 0.0–0.2)

## 2018-08-01 LAB — MRSA PCR SCREENING: MRSA by PCR: POSITIVE — AB

## 2018-08-01 LAB — LIPASE, BLOOD
Lipase: 62 U/L — ABNORMAL HIGH (ref 11–51)
Lipase: 61 U/L — ABNORMAL HIGH (ref 11–51)

## 2018-08-01 LAB — BRAIN NATRIURETIC PEPTIDE: B Natriuretic Peptide: 155.8 pg/mL — ABNORMAL HIGH (ref 0.0–100.0)

## 2018-08-01 MED ORDER — ONDANSETRON HCL 4 MG/2ML IJ SOLN: 4.0000 mg | Freq: Four times a day (QID) | INTRAMUSCULAR | Status: DC | PRN

## 2018-08-01 MED ORDER — LANTHANUM CARBONATE 500 MG PO CHEW: 1500.0000 mg | CHEWABLE_TABLET | ORAL | Status: DC | PRN

## 2018-08-01 MED ORDER — SODIUM CHLORIDE 0.9% FLUSH
3.0000 mL | Freq: Two times a day (BID) | INTRAVENOUS | Status: DC
  Administered 2018-08-01 – 2018-08-06 (×8): 3 mL via INTRAVENOUS

## 2018-08-01 MED ORDER — ATORVASTATIN CALCIUM 80 MG PO TABS
80.0000 mg | ORAL_TABLET | Freq: Every day | ORAL | Status: DC
  Administered 2018-08-01 – 2018-08-06 (×6): 80 mg via ORAL
  Filled 2018-08-01 (×6): qty 1

## 2018-08-01 MED ORDER — MIDODRINE HCL 5 MG PO TABS
15.0000 mg | ORAL_TABLET | Freq: Three times a day (TID) | ORAL | Status: DC
  Administered 2018-08-02 – 2018-08-07 (×15): 15 mg via ORAL
  Filled 2018-08-01 (×14): qty 3

## 2018-08-01 MED ORDER — METOPROLOL TARTRATE 25 MG PO TABS
25.0000 mg | ORAL_TABLET | Freq: Two times a day (BID) | ORAL | Status: DC
  Administered 2018-08-01 – 2018-08-07 (×4): 25 mg via ORAL
  Filled 2018-08-01 (×8): qty 1

## 2018-08-01 MED ORDER — ACETAMINOPHEN 325 MG PO TABS
650.0000 mg | ORAL_TABLET | Freq: Four times a day (QID) | ORAL | Status: DC | PRN
  Administered 2018-08-01: 650 mg via ORAL
  Filled 2018-08-01: qty 2

## 2018-08-01 MED ORDER — RENA-VITE PO TABS
1.0000 | ORAL_TABLET | Freq: Every day | ORAL | Status: DC
  Administered 2018-08-03 – 2018-08-07 (×4): 1 via ORAL
  Filled 2018-08-01 (×8): qty 1

## 2018-08-01 MED ORDER — AMIODARONE HCL 200 MG PO TABS
200.0000 mg | ORAL_TABLET | Freq: Every day | ORAL | Status: DC
  Administered 2018-08-01 – 2018-08-06 (×5): 200 mg via ORAL
  Filled 2018-08-01 (×6): qty 1

## 2018-08-01 MED ORDER — MORPHINE SULFATE (PF) 4 MG/ML IV SOLN
4.0000 mg | Freq: Once | INTRAVENOUS | Status: AC
  Administered 2018-08-01: 12:00:00 4 mg via INTRAVENOUS
  Filled 2018-08-01: qty 1

## 2018-08-01 MED ORDER — IOPAMIDOL (ISOVUE-300) INJECTION 61%
100.0000 mL | Freq: Once | INTRAVENOUS | Status: AC | PRN
  Administered 2018-08-01: 100 mL via INTRAVENOUS

## 2018-08-01 MED ORDER — PHYTONADIONE 5 MG PO TABS
10.0000 mg | ORAL_TABLET | Freq: Once | ORAL | Status: AC
  Administered 2018-08-01: 10 mg via ORAL
  Filled 2018-08-01: qty 2

## 2018-08-01 MED ORDER — ONDANSETRON HCL 4 MG/2ML IJ SOLN
4.0000 mg | Freq: Once | INTRAMUSCULAR | Status: AC
  Administered 2018-08-01: 4 mg via INTRAVENOUS
  Filled 2018-08-01: qty 2

## 2018-08-01 MED ORDER — ONDANSETRON HCL 4 MG PO TABS: 4.0000 mg | ORAL_TABLET | Freq: Four times a day (QID) | ORAL | Status: DC | PRN

## 2018-08-01 MED ORDER — SODIUM CHLORIDE 0.9 % IV SOLN
Freq: Once | INTRAVENOUS | Status: AC
  Administered 2018-08-01: 12:00:00 via INTRAVENOUS

## 2018-08-01 MED ORDER — ACETAMINOPHEN 650 MG RE SUPP: 650.0000 mg | Freq: Four times a day (QID) | RECTAL | Status: DC | PRN

## 2018-08-01 MED ORDER — LANTHANUM CARBONATE 500 MG PO CHEW
2000.0000 mg | CHEWABLE_TABLET | Freq: Three times a day (TID) | ORAL | Status: DC
  Administered 2018-08-05 – 2018-08-06 (×4): 2000 mg via ORAL
  Filled 2018-08-01 (×12): qty 4

## 2018-08-01 MED ORDER — HYDROCODONE-ACETAMINOPHEN 5-325 MG PO TABS
1.0000 | ORAL_TABLET | Freq: Four times a day (QID) | ORAL | Status: DC | PRN
  Administered 2018-08-01 – 2018-08-03 (×4): 2 via ORAL
  Administered 2018-08-03: 1 via ORAL
  Filled 2018-08-01 (×2): qty 2
  Filled 2018-08-01: qty 1
  Filled 2018-08-01: qty 2

## 2018-08-01 MED ORDER — POLYETHYLENE GLYCOL 3350 17 G PO PACK: 17.0000 g | PACK | Freq: Every day | ORAL | Status: DC | PRN

## 2018-08-01 NOTE — ED Notes (Signed)
Attempted in and out on pt to collect urine, attempt failed. Notified Taylor(RN)

## 2018-08-01 NOTE — ED Notes (Signed)
Pt stated "unable to make urine"

## 2018-08-01 NOTE — ED Notes (Signed)
Patient transported to CT 

## 2018-08-01 NOTE — ED Notes (Signed)
ED Provider at bedside. 

## 2018-08-01 NOTE — Progress Notes (Addendum)
ANTICOAGULATION CONSULT NOTE - Initial Consult  Pharmacy Consult for warfarin Indication: mechanical AVR  No Known Allergies  Patient Measurements:     Vital Signs: Temp: 98.3 F (36.8 C) (04/15 1155) Temp Source: Oral (04/15 1155) BP: 120/66 (04/15 1700) Pulse Rate: 106 (04/15 1700)  Labs: Recent Labs    08/01/18 1213 08/01/18 1312 08/01/18 1354 08/01/18 1551  HGB 8.9*  --   --   --   HCT 29.5*  --   --   --   PLT 356  --   --   --   LABPROT  --   --   --  61.4*  INR  --   --   --  7.3*  CREATININE  --  8.03* 7.95*  --     Estimated Creatinine Clearance: 15.2 mL/min (A) (by C-G formula based on SCr of 7.95 mg/dL (H)).   Medical History: Past Medical History:  Diagnosis Date  . Aneurysm ([place])     Right arm fistula 3 aneurysms 2011,   plans to have a new procedure  . Angina   . Chest discomfort   . CHF (congestive heart failure) ([place])   . Coronary artery disease   . Coronary artery disease involving native coronary artery of native heart without angina pectoris   . Dialysis patient ([place])   . Dysrhythmia   . ESRD (end stage renal disease) ([place])    on hemodialysis [id number]  . Hypertension   . Leg pain   . Mitral regurgitation   . Morbid obesity ([place])   . Orthostatic hypotension   . Overweight(278.02)   . Renal insufficiency   . 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 Severe aortic insufficiency   . Sinus tachycardia   . Syncope     positional after dialysis... 2008    Medications:  Medications Prior to Admission  Medication Sig Dispense Refill Last Dose  . amiodarone (PACERONE) 200 MG tablet Take 1 tablet (200 mg total) by mouth daily. 90 tablet 3 07/31/2018 at am  . aspirin EC 81 MG EC tablet Take 1 tablet (81 mg total) by mouth daily.   07/31/2018 at Unknown time  . atorvastatin (LIPITOR) 80 MG tablet Take 1 tablet (80  mg total) by mouth daily at 6 PM. 90 tablet 3 07/31/2018 at Unknown time  . B Complex-C-Folic Acid (NEPHRO-VITE PO) Take 1 tablet by mouth daily.    07/31/2018 at Unknown time  . ciprofloxacin (CIPRO) 500 MG tablet Take 1 tablet (500 mg total) by mouth daily. Take after dialysis 10 tablet 0 07/31/2018 at Unknown time  . FOSRENOL 500 MG chewable tablet Chew 1,500-2,000 mg by mouth See admin instructions. Take 2000 mg by mouth with meals and take 1500 mg by mouth with snacks   07/31/2018 at Unknown time  . metoprolol tartrate (LOPRESSOR) 25 MG tablet TAKE 1 TABLET BY MOUTH TWICE DAILY (Patient taking differently: Take 25 mg by mouth 2 (two) times daily. (BETA BLOCKER)) 60 tablet 6 07/31/2018 at 1400  . metroNIDAZOLE (FLAGYL) 500 MG tablet Take 1 tablet (500 mg total) by mouth 3 (three) times daily. 30 tablet 0 07/31/2018 at Unknown time  . midodrine (PROAMATINE) 5 MG tablet Take 3 tablets (15 mg total) by mouth 3 (three) times daily with meals. 810 tablet 3 07/31/2018 at Unknown time  . warfarin (COUMADIN)  5 MG tablet Take 1 tablet daily except 1/2 tablet on Tuesdays, Thursdays and Saturdays or as directed by coumadin clinic (Patient taking differently: Take 5 mg by mouth daily. ) 90 tablet 1 07/31/2018 at 1800  . acetaminophen-codeine (TYLENOL #3) 300-30 MG tablet Take 1-2 tablets by mouth every 6 (six) hours as needed for moderate pain. (Patient not taking: Reported on 08/01/2018) 20 tablet 0 Not Taking at Unknown time  . HYDROcodone-acetaminophen (NORCO/VICODIN) 5-325 MG tablet Take 1-2 tablets by mouth every 4 (four) hours as needed. (Patient not taking: Reported on 08/01/2018) 6 tablet 0 Not Taking at Unknown time  . oxyCODONE (OXY IR/ROXICODONE) 5 MG immediate release tablet Take 1 tablet (5 mg total) by mouth every 4 (four) hours as needed for severe pain. (Patient not taking: Reported on 08/01/2018) 30 tablet 0 Not Taking at Unknown time   Scheduled:  . amiodarone  200 mg Oral Daily  . atorvastatin  80 mg  Oral q1800  . [START ON 08/02/2018] b complex-vitamin c-folic acid  1 tablet Oral Daily  . [START ON 08/02/2018] lanthanum  2,000 mg Oral TID WC  . metoprolol tartrate  25 mg Oral BID  . [START ON 08/02/2018] midodrine  15 mg Oral TID WC  . phytonadione  10 mg Oral Once  . sodium chloride flush  3 mL Intravenous Q12H    Assessment: 53 yo male here with hematuria (Noted w/ large left perinephric hematoma). He has a mechanical AVR on coumadin PTA. Pharmacy consulted to dose.  Coumadin 10mg  po ordered for tonight. Noted possible possible angiography/embolization and may need to start heparin  -INR= 7.3   Home dose: 5mg /day per patient (last dose 4/15)  Per clinic note 07/10/18: 5mg /day except take 2.5 5mg  on Sun/Wed (last clinic visit 07/19/18 with INR= 2.6)  Goal of Therapy:  INR 2-3 Monitor platelets by anticoagulation protocol: Yes   Plan:  -No coumadin tonight -Daily PT/INR -Consider heparin when INR < 2.0  Hildred Laser, PharmD Clinical Pharmacist **Pharmacist phone directory can now be found on [email or web address] ([email or web address]).  Listed under [place].

## 2018-08-01 NOTE — ED Notes (Signed)
Pt returned from CT °

## 2018-08-01 NOTE — ED Notes (Signed)
ED TO INPATIENT HANDOFF REPORT  ED Nurse Name and Phone #: Lovena Le [phone]  S Name/Age/Gender [name] [name] 53 y.o. male Room/Bed: 011C/011C  Code Status   Code Status: DNR  Home/SNF/Other Home Patient oriented to: self, place, time and situation Is this baseline? Yes   Triage Complete: Triage complete  Chief Complaint sob/general body aches  Triage Note Pt reporting shortness of breath a long with left side knee pain with radiation up to the left shoulder. Pt recently seen at [facility] and was requesting our facility. He reports being seen for blood in the urine there. Hx of dialysis. A/O at triage. Speaking clear sentences at triage. NAD.    Allergies No Known Allergies  Level of Care/Admitting Diagnosis ED Disposition    ED Disposition Condition Comment   Admit  Hospital Area: [place] [[id number]]  Level of Care: Telemetry Medical [104]  Diagnosis: Renal hematoma, left, initial encounter [[id number]]  Admitting Physician: Oda [place]ts [[id number]]  Attending Physician: Oda [place] [[id number]]  Estimated length of stay: past midnight tomorrow  Certification:: I certify this patient will need inpatient services for at least 2 midnights  Possible Covid Disease Patient Isolation: Low Risk  (Less than 4L  supplementation)  PT Class (Do Not Modify): Inpatient [101]  PT Acc Code (Do Not Modify): Private [1]       B Medical/Surgery History Past Medical History:  Diagnosis Date  . Aneurysm ([place])     Right arm fistula 3 aneurysms 2011,   plans to have a new procedure  . Angina   . Chest discomfort   . CHF (congestive heart failure) ([place])   . Coronary artery disease   . Coronary artery disease involving native coronary artery of native heart without angina pectoris   . Dialysis patient ([place])   . Dysrhythmia   . ESRD (end stage renal disease) ([place])    on hemodialysis T_T_S  . Hypertension   . Leg pain   . Mitral regurgitation   . Morbid  obesity ([place])   . Orthostatic hypotension   . Overweight(278.02)   . Renal insufficiency   . 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 Severe aortic insufficiency   . Sinus tachycardia   . Syncope     positional after dialysis... 2008   Past Surgical History:  Procedure Laterality Date  . A/V FISTULAGRAM Left 06/30/2016   Procedure: A/V Fistulagram;  Surgeon: Algernon Huxley, MD;  Location: [place] [facility];  Service: Cardiovascular;  Laterality: Left;  . AORTIC VALVE REPLACEMENT N/A 09/01/2017   Procedure: AORTIC VALVE REPLACEMENT (AVR);  Surgeon: [name], MD;  Location: [place];  Service: Open Heart Surgery;  Laterality: N/A;  . ARTERIOVENOUS GRAFT PLACEMENT    . AV FISTULA PLACEMENT    . AV FISTULA PLACEMENT Left 11/18/2015   Procedure: ARTERIOVENOUS (AV) FISTULA CREATION ( RADIOCEPHALIC );  Surgeon: Algernon Huxley, MD;  Location: [facility];  Service: Vascular;  Laterality: Left;  . AV FISTULA PLACEMENT Left 03/23/2016   Procedure: ARTERIOVENOUS (AV) FISTULA CREATION ( REVISION );  Surgeon: Algernon Huxley, MD;  Location: [facility];  Service: Vascular;  Laterality: Left;  . [place] REMOVAL Left 03/23/2016   Procedure: REMOVAL OF ARTERIOVENOUS GORETEX GRAFT ([place]);  Surgeon: Algernon Huxley, MD;  Location: [facility];  Service: Vascular;  Laterality: Left;  . CENTRAL LINE INSERTION Right 09/01/2017   Procedure: FLOROSCOPY GUIDED PLACEMENT OF RIGHT FEMORAL CENTRAL LINE TIMES TWO  AND PLACEMENT OF SWAN GANZ CATHETER;  Surgeon: [name], MD;  Location: [place];  Service: Open Heart Surgery;  Laterality: Right;  . CORONARY ARTERY BYPASS GRAFT N/A 09/01/2017   Procedure: CORONARY ARTERY BYPASS GRAFTING (CABG) X 1, USING RIGHT GREATER SAPHENOUS VEIN HARVESTED ENDOSCOPICALLY;  Surgeon: [name], MD;  Location: [place];  Service: Open Heart Surgery;  Laterality: N/A;  .  DIALYSIS/PERMA CATHETER REMOVAL N/A 08/01/2016   Procedure: Dialysis/Perma Catheter Removal;  Surgeon: Algernon Huxley, MD;  Location: [place] [facility];  Service: Cardiovascular;  Laterality: N/A;  . HERNIA REPAIR    . INSERTION OF DIALYSIS CATHETER  03/01/2011   Procedure: INSERTION OF DIALYSIS CATHETER;  Surgeon: Elam Dutch, MD;  Location: [place];  Service: Vascular;  Laterality: Left;  Exchange of Dialysis Catheter to 27cm 15Fr. Arrow Catheter  . INSERTION OF DIALYSIS CATHETER  09/01/2017   Procedure: PLACEMENT OF TRIALYSIS SHORT TERM DIALYSIS CATHETER;  Surgeon: [name], MD;  Location: [place] [facility];  Service: Open Heart Surgery;;  . MULTIPLE EXTRACTIONS WITH ALVEOLOPLASTY N/A 08/07/2017   Procedure: Extraction of tooth #2, 7,8,13,14,15,17, and 31 with alveoloplasty and gross debridement of remaining teeth;  Surgeon: [name], DDS;  Location: [place];  Service: Oral Surgery;  Laterality: N/A;  . PERIPHERAL VASCULAR CATHETERIZATION N/A 09/01/2014   Procedure: A/V Shuntogram/Fistulagram;  Surgeon: Algernon Huxley, MD;  Location: [place] [place] [facility];  Service: Cardiovascular;  Laterality: N/A;  . PERIPHERAL VASCULAR CATHETERIZATION Right 09/01/2014   Procedure: Thrombectomy;  Surgeon: Algernon Huxley, MD;  Location: [place] [facility];  Service: Cardiovascular;  Laterality: Right;  . PERIPHERAL VASCULAR CATHETERIZATION Right 09/01/2014   Procedure: A/V Shunt Intervention;  Surgeon: Algernon Huxley, MD;  Location: [place] [facility];  Service: Cardiovascular;  Laterality: Right;  . PERIPHERAL VASCULAR CATHETERIZATION N/A 09/17/2014   Procedure: A/V Shuntogram/Fistulagram;  Surgeon: Algernon Huxley, MD;  Location: [place] [facility];  Service: Cardiovascular;  Laterality: N/A;  . PERIPHERAL VASCULAR CATHETERIZATION Right 10/17/2014   Procedure: Thrombectomy;  Surgeon: Katha Cabal, MD;  Location: [place] [facility];  Service: Cardiovascular;  Laterality: Right;  . PERIPHERAL VASCULAR  CATHETERIZATION N/A 10/17/2014   Procedure: A/V Shuntogram/Fistulagram;  Surgeon: Katha Cabal, MD;  Location: [place] [facility];  Service: Cardiovascular;  Laterality: N/A;  . PERIPHERAL VASCULAR CATHETERIZATION N/A 10/17/2014   Procedure: A/V Shunt Intervention;  Surgeon: Katha Cabal, MD;  Location: [place] [facility];  Service: Cardiovascular;  Laterality: N/A;  . PERIPHERAL VASCULAR CATHETERIZATION Right 11/04/2014   Procedure: Thrombectomy;  Surgeon: Katha Cabal, MD;  Location: [place] [facility];  Service: Cardiovascular;  Laterality: Right;  . PERIPHERAL VASCULAR CATHETERIZATION Left 11/04/2014   Procedure: Visceral Venography;  Surgeon: Katha Cabal, MD;  Location: [place] [facility];  Service: Cardiovascular;  Laterality: Left;  . PERIPHERAL VASCULAR CATHETERIZATION Right 11/27/2014   Procedure: A/V Shuntogram/Fistulagram;  Surgeon: Algernon Huxley, MD;  Location: [facility] [facility];  Service: Cardiovascular;  Laterality: Right;  . PERIPHERAL VASCULAR CATHETERIZATION Right 11/27/2014   Procedure: A/V Shunt Intervention;  Surgeon: Algernon Huxley, MD;  Location: [place] [facility];  Service: Cardiovascular;  Laterality: Right;  . PERIPHERAL VASCULAR CATHETERIZATION Right 12/31/2014   Procedure: Thrombectomy;  Surgeon: Algernon Huxley, MD;  Location: [place] [facility];  Service: Cardiovascular;  Laterality: Right;  . PERIPHERAL VASCULAR CATHETERIZATION Right 01/12/2015   Procedure: A/V Shuntogram/Fistulagram;  Surgeon: Algernon Huxley, MD;  Location: [place] [facility];  Service: Cardiovascular;  Laterality: Right;  . PERIPHERAL VASCULAR CATHETERIZATION N/A 01/12/2015   Procedure: A/V Shunt Intervention;  Surgeon: Algernon Huxley, MD;  Location: [place] [facility];  Service: Cardiovascular;  Laterality: N/A;  . PERIPHERAL VASCULAR CATHETERIZATION Right 01/28/2015   Procedure: Thrombectomy;  Surgeon: Algernon Huxley, MD;  Location: [place] [facility];  Service: Cardiovascular;   Laterality: Right;  . PERIPHERAL VASCULAR CATHETERIZATION N/A 06/08/2015   Procedure: A/V Shuntogram/Fistulagram;  Surgeon: Algernon Huxley, MD;  Location: [place] [facility];  Service: Cardiovascular;  Laterality: N/A;  . PERIPHERAL VASCULAR CATHETERIZATION N/A 06/08/2015   Procedure: A/V Shunt Intervention;  Surgeon: Algernon Huxley, MD;  Location: [place] [facility];  Service: Cardiovascular;  Laterality: N/A;  . PERIPHERAL VASCULAR CATHETERIZATION Right 07/06/2015   Procedure: A/V Shuntogram/Fistulagram;  Surgeon: Algernon Huxley, MD;  Location: [place] [facility];  Service: Cardiovascular;  Laterality: Right;  . PERIPHERAL VASCULAR CATHETERIZATION N/A 07/06/2015   Procedure: A/V Shunt Intervention;  Surgeon: Algernon Huxley, MD;  Location: [place] [facility];  Service: Cardiovascular;  Laterality: N/A;  . PERIPHERAL VASCULAR CATHETERIZATION N/A 08/04/2015   Procedure: graft declot;  Surgeon: Katha Cabal, MD;  Location: [place] [facility];  Service: Cardiovascular;  Laterality: N/A;  . PERIPHERAL VASCULAR CATHETERIZATION Right 08/25/2015   Procedure: A/V Shuntogram/Fistulagram;  Surgeon: Katha Cabal, MD;  Location: [place] [facility];  Service: Cardiovascular;  Laterality: Right;  . PERIPHERAL VASCULAR CATHETERIZATION N/A 11/04/2015   Procedure: Dialysis/Perma Catheter Insertion;  Surgeon: Algernon Huxley, MD;  Location: [place] [facility];  Service: Cardiovascular;  Laterality: N/A;  . PERIPHERAL VASCULAR CATHETERIZATION Left 12/14/2015   Procedure: A/V Shuntogram/Fistulagram;  Surgeon: Algernon Huxley, MD;  Location: [place] [facility];  Service: Cardiovascular;  Laterality: Left;  . PERIPHERAL VASCULAR CATHETERIZATION N/A 12/14/2015   Procedure: A/V Shunt Intervention;  Surgeon: Algernon Huxley, MD;  Location: [place] [facility];  Service: Cardiovascular;  Laterality: N/A;  . PERIPHERAL VASCULAR CATHETERIZATION N/A 12/17/2015   Procedure: Dialysis/Perma Catheter Insertion;  Surgeon: Algernon Huxley,  MD;  Location: [place] [facility];  Service: Cardiovascular;  Laterality: N/A;  . PERIPHERAL VASCULAR CATHETERIZATION Left 12/22/2015   Procedure: Dialysis/Perma Catheter Insertion;  Surgeon: Katha Cabal, MD;  Location: [place] [facility];  Service: Cardiovascular;  Laterality: Left;  . PERIPHERAL VASCULAR CATHETERIZATION Left 02/29/2016   Procedure: A/V Shuntogram/Fistulagram;  Surgeon: Algernon Huxley, MD;  Location: [facility] [facility];  Service: Cardiovascular;  Laterality: Left;  . PERIPHERAL VASCULAR CATHETERIZATION N/A 02/29/2016   Procedure: A/V Shunt Intervention;  Surgeon: Algernon Huxley, MD;  Location: [place] [facility];  Service: Cardiovascular;  Laterality: N/A;  . POCKET REVISION/RELOCATION N/A 01/01/2018   Procedure: POCKET REVISION/RELOCATION;  Surgeon: Evans Lance, MD;  Location: [place] [facility];  Service: Cardiovascular;  Laterality: N/A;  . right arm graft     for dyalisis  . RIGHT/LEFT HEART CATH AND CORONARY ANGIOGRAPHY N/A 08/02/2017   Procedure: RIGHT/LEFT HEART CATH AND CORONARY ANGIOGRAPHY;  Surgeon: Larey Dresser, MD;  Location: [place] [facility];  Service: Cardiovascular;  Laterality: N/A;  . SUBQ ICD IMPLANT N/A 11/28/2017   Procedure: SUBQ ICD IMPLANT;  Surgeon: Evans Lance, MD;  Location: [place] [facility];  Service: Cardiovascular;  Laterality: N/A;  . SUBQ ICD REVISION N/A 02/12/2018   Procedure: SUBQ ICD REVISION(REMOVAL);  Surgeon: Evans Lance, MD;  Location: [place] [facility];  Service: Cardiovascular;  Laterality: N/A;  . TEE WITHOUT CARDIOVERSION N/A 07/31/2017   Procedure: TRANSESOPHAGEAL ECHOCARDIOGRAM (TEE);  Surgeon: Josue Hector, MD;  Location: [facility];  Service: Cardiovascular;  Laterality: N/A;  . TEE WITHOUT CARDIOVERSION N/A 09/01/2017   Procedure: TRANSESOPHAGEAL ECHOCARDIOGRAM (TEE);  Surgeon: [name], MD;  Location: [place];  Service: Open Heart Surgery;  Laterality: N/A;  . THROMBECTOMY    . THROMBECTOMY W/  EMBOLECTOMY  03/01/2011   Procedure: THROMBECTOMY ARTERIOVENOUS GORE-TEX GRAFT;  Surgeon: Elam Dutch, MD;  Location: [place];  Service: Vascular;  Laterality: Right;  Attempted Thrombectomy of Old  Right Upper Arm Arteriovenous gortex Graft. Insertion of new Arteriovenous Graft using 27mm x 50cm Gortex Stretch graft.   . THROMBECTOMY W/ EMBOLECTOMY  07/11/2011   Procedure: THROMBECTOMY ARTERIOVENOUS GORE-TEX GRAFT;  Surgeon: Rosetta Posner, MD;  Location: [place];  Service: Vascular;  Laterality: Right;  . UMBILICAL HERNIA REPAIR    . VENOGRAM N/A 08/23/2011   Procedure: VENOGRAM;  Surgeon: [name], MD;  Location: [facility];  Service: Cardiovascular;  Laterality: N/A;     A IV Location/Drains/Wounds Patient Lines/Drains/Airways Status   Active Line/Drains/Airways    Name:   Placement date:   Placement time:   Site:   Days:   Peripheral IV 08/01/18 Right;Lateral Antecubital   08/01/18    1213    Antecubital   less than 1   Fistula / Graft Left Forearm Arteriovenous fistula   04/06/12    1738    Forearm   2308   Vascular Access Right Internal jugular Hemodialysis catheter   08/08/12    1621    Internal jugular   2184   Fistula / Graft Right Upper arm Arteriovenous fistula   -    -    Upper arm      Fistula / Graft Right Upper arm   06/08/15    -    Upper arm   1150   Fistula / Graft Left Forearm   -    -    Forearm      Fistula / Graft Left Forearm   -    -    Forearm      Fistula / Graft Left Forearm Arteriovenous fistula   -    -    Forearm      Fistula / Graft Left Forearm   07/30/17    2039    Forearm   367   Post Cath / Sheath 06/08/15 Right Venous   06/08/15    1600    Venous   1150   Post Cath / Sheath 06/08/15 Right Venous   06/08/15    1602    Venous   1150   Post Cath / Sheath 07/06/15 Right Venous   07/06/15    1641    Venous   1122   Post Cath / Sheath 07/06/15 Right Venous   07/06/15    1643    Venous   1122   Post Cath / Sheath 08/25/15 Right    08/25/15    1530    -    1072   Post Cath / Sheath 12/14/15 Left   12/14/15    1434    -   961   Post Cath / Sheath 02/29/16 Left Venous  02/29/16    1151    Venous   884   Sheath 06/30/16 Left Venous;Brachial   06/30/16    1144    Venous;Brachial   762   Incision (Closed) 08/07/17 N/A   08/07/17    1131     359   Incision (Closed) 09/01/17 Chest Other (Comment)   09/01/17    1413     334   Incision (Closed) 09/01/17 Leg Right   09/01/17    1413     334   Incision (Closed) 02/12/18 Chest Right;Left;Medial;Mid;Upper;Lower   02/12/18    1830     170          Intake/Output Last 24 hours No intake or output data in the 24 hours ending 08/01/18 1704  Labs/Imaging Results for orders placed or performed during the hospital encounter of 08/01/18 (from the past 48 hour(s))  CBC with Differential     Status: Abnormal   Collection Time: 08/01/18 12:13 PM  Result Value Ref Range   WBC 15.0 (H) 4.0 - 10.5 K/uL   RBC 3.51 (L) 4.22 - 5.81 MIL/uL   Hemoglobin 8.9 (L) 13.0 - 17.0 g/dL   HCT 29.5 (L) 39.0 - 52.0 %   MCV 84.0 80.0 - 100.0 fL   MCH 25.4 (L) 26.0 - 34.0 pg   MCHC 30.2 30.0 - 36.0 g/dL   RDW 16.0 (H) 11.5 - 15.5 %   Platelets 356 150 - 400 K/uL   nRBC 0.0 0.0 - 0.2 %   Neutrophils Relative % 87 %   Neutro Abs 13.2 (H) 1.7 - 7.7 K/uL   Lymphocytes Relative 4 %   Lymphs Abs 0.6 (L) 0.7 - 4.0 K/uL   Monocytes Relative 7 %   Monocytes Absolute 1.0 0.1 - 1.0 K/uL   Eosinophils Relative 1 %   Eosinophils Absolute 0.1 0.0 - 0.5 K/uL   Basophils Relative 0 %   Basophils Absolute 0.0 0.0 - 0.1 K/uL   Immature Granulocytes 1 %   Abs Immature Granulocytes 0.14 (H) 0.00 - 0.07 K/uL    Comment: Performed at [facility], [address] [place], [place] [address]  Brain natriuretic peptide     Status: Abnormal   Collection Time: 08/01/18 12:13 PM  Result Value Ref Range   B Natriuretic Peptide 155.8 (H) 0.0 - 100.0 pg/mL    Comment: Performed at [facility] [address] [place], [place]  [address]  Lipase, blood     Status: Abnormal   Collection Time: 08/01/18  1:12 PM  Result Value Ref Range   Lipase 62 (H) 11 - 51 U/L    Comment: Performed at [facility] [address] [place], [place] [address]  Comprehensive metabolic panel     Status: Abnormal   Collection Time: 08/01/18  1:12 PM  Result Value Ref Range   Sodium 137 135 - 145 mmol/L   Potassium 3.4 (L) 3.5 - 5.1 mmol/L   Chloride 97 (L) 98 - 111 mmol/L   CO2 27 22 - 32 mmol/L   Glucose, Bld 110 (H) 70 - 99 mg/dL   BUN 33 (H) 6 - 20 mg/dL   Creatinine, Ser 8.03 (H) 0.61 - 1.24 mg/dL   Calcium 7.9 (L) 8.9 - 10.3 mg/dL   Total Protein 7.3 6.5 - 8.1 g/dL   Albumin 2.5 (L) 3.5 - 5.0 g/dL   AST 28 15 - 41 U/L   ALT 13 0 - 44 U/L  Alkaline Phosphatase 105 38 - 126 U/L   Total Bilirubin 1.3 (H) 0.3 - 1.2 mg/dL   GFR calc non Af Amer 7 (L) >60 mL/min   GFR calc Af Amer 8 (L) >60 mL/min   Anion gap 13 5 - 15    Comment: Performed at [facility] [address] [place], [place] [address]  Lipase, blood     Status: Abnormal   Collection Time: 08/01/18  1:54 PM  Result Value Ref Range   Lipase 61 (H) 11 - 51 U/L    Comment: Performed at [facility] [address], [place], [place] [address]  Comprehensive metabolic panel     Status: Abnormal   Collection Time: 08/01/18  1:54 PM  Result Value Ref Range   Sodium 137 135 - 145 mmol/L   Potassium 3.6 3.5 - 5.1 mmol/L   Chloride 97 (L) 98 - 111 mmol/L   CO2 28 22 - 32 mmol/L   Glucose, Bld 107 (H) 70 - 99 mg/dL   BUN 33 (H) 6 - 20 mg/dL   Creatinine, Ser 7.95 (H) 0.61 - 1.24 mg/dL   Calcium 7.8 (L) 8.9 - 10.3 mg/dL   Total Protein 7.4 6.5 - 8.1 g/dL   Albumin 2.3 (L) 3.5 - 5.0 g/dL   AST 24 15 - 41 U/L   ALT 12 0 - 44 U/L   Alkaline Phosphatase 103 38 - 126 U/L   Total Bilirubin 1.3 (H) 0.3 - 1.2 mg/dL   GFR calc non Af Amer 7 (L) >60 mL/min   GFR calc Af Amer 8 (L) >60 mL/min   Anion gap 12 5 - 15    Comment: Performed at [facility], [place] [address] [place], [place] [address]  Protime-INR     Status: Abnormal   Collection Time: 08/01/18  3:51 PM  Result Value Ref Range   Prothrombin Time 61.4 (H) 11.4 - 15.2 seconds   INR 7.3 (HH) 0.8 - 1.2    Comment: CRITICAL RESULT CALLED TO, READ BACK BY AND VERIFIED WITH: C Jonuel Butterfield,RN 1638 08/01/2018 WBOND (NOTE) INR goal varies based on device and disease states. Performed at [facility], [place] [address] [place], [place] [address]    Ct Abdomen Pelvis W Contrast  Result Date: 08/01/2018 CLINICAL DATA:  Hematuria this morning. On hemodialysis. History of anticoagulation for aortic valve replacement. EXAM: CT ABDOMEN AND PELVIS WITH CONTRAST TECHNIQUE: Multidetector CT imaging of the abdomen and pelvis was performed using the standard protocol following bolus administration of intravenous contrast. CONTRAST:  158mL ISOVUE-300 IOPAMIDOL (ISOVUE-300) INJECTION 61% COMPARISON:  Abdominopelvic CT 07/23/2018 FINDINGS: Lower chest: Mild atelectasis at the left lung base. No significant pleural or pericardial effusion. Previous median sternotomy and bilateral gynecomastia noted. Hepatobiliary: The liver is normal in density without suspicious focal abnormality. There is a small calcified gallstone. No gallbladder wall thickening or biliary dilatation. Pancreas: Unremarkable. No pancreatic ductal dilatation or surrounding inflammatory changes. Spleen: Normal in size without focal abnormality. Adrenals/Urinary Tract: Both adrenal glands appear normal. Both kidneys demonstrate enlargement and numerous complex cysts consistent with adult polycystic kidney disease. Compared with the recent prior study, the right kidney appears unchanged. There has been significant change in appearance of the left kidney however. There is a new heterogeneous, predominately high density mass involving the left kidney consistent with a hematoma. This measures up to 15.0 x 13.1 cm transverse on image 31/5 and  extends 18.3 cm in height (image 98/8).  In addition, there is a new left perinephric hematoma which measures up to 3.2 cm in thickness. There is probable clot within the bladder lumen as before. No hydronephrosis. Stomach/Bowel: No evidence of bowel wall thickening, distention or surrounding inflammatory change. Sigmoid diverticulosis. Vascular/Lymphatic: There are no enlarged abdominal or pelvic lymph nodes. Aortic and branch vessel atherosclerosis without acute vascular findings. Reproductive: The prostate gland and seminal vesicles appear normal. Other: Stable periumbilical hernia containing only fat. No ascites or free intraperitoneal air. Musculoskeletal: No acute or significant osseous findings. There are extensive endplate degenerative changes throughout the spine consistent with renal osteodystrophy. IMPRESSION: 1. Interval significant change in appearance of the left kidney compared with previous CT of 9 days ago. The left kidney is enlarged with heterogeneous high density consistent with bleeding into one or more of the complex left renal cysts. In addition, there is a moderate size left perinephric hematoma. 2. Underlying changes of adult polycystic kidney disease bilaterally. 3. Dependent high density in the bladder again noted, likely blood clot. 4. Other incidental findings are stable, including sigmoid diverticulosis, a periumbilical hernia containing only fat and Aortic Atherosclerosis (ICD10-I70.0). Electronically Signed   By: Richardean Sale M.D.   On: 08/01/2018 14:40   Dg Chest Port 1 View  Result Date: 08/01/2018 CLINICAL DATA:  Shortness of breath. History of aortic valve replacement and CABG. EXAM: PORTABLE CHEST 1 VIEW COMPARISON:  01/05/2018. FINDINGS: 1200 hours. Lordotic positioning. The heart size and mediastinal contours are stable status post median sternotomy and CABG. The lungs are clear. There is no pleural effusion or pneumothorax. Right axillary vascular stent again noted.  IMPRESSION: Stable chest.  No active cardiopulmonary process. Electronically Signed   By: Richardean Sale M.D.   On: 08/01/2018 13:03    Pending Labs Unresulted Labs (From admission, onward)    Start     Ordered   08/02/18 0500  CBC  Tomorrow morning,   R     08/01/18 1657   08/01/18 1652  HIV antibody (Routine Testing)  Once,   R     08/01/18 1657   08/01/18 1508  Type and screen [place]  Once,   STAT    Comments:  [place]    08/01/18 1507   08/01/18 1202  Urinalysis, Routine w reflex microscopic  ONCE - STAT,   STAT     08/01/18 1201   08/01/18 1202  Urine culture  ONCE - STAT,   STAT     08/01/18 1201          Vitals/Pain Today's Vitals   08/01/18 1530 08/01/18 1600 08/01/18 1630 08/01/18 1700  BP: (!) 107/57 116/74 104/78 120/66  Pulse: (!) 107 (!) 111 (!) 108 (!) 106  Resp: 19 (!) 26 (!) 26 (!) 21  Temp:      TempSrc:      SpO2: 100% 100% 100% 100%  PainSc:        Isolation Precautions No active isolations  Medications Medications  sodium chloride flush (NS) 0.9 % injection 3 mL (has no administration in time range)  acetaminophen (TYLENOL) tablet 650 mg (has no administration in time range)    Or  acetaminophen (TYLENOL) suppository 650 mg (has no administration in time range)  polyethylene glycol (MIRALAX / GLYCOLAX) packet 17 g (has no administration in time range)  ondansetron (ZOFRAN) tablet 4 mg (has no administration in time range)    Or  ondansetron (ZOFRAN) injection 4 mg (has no administration in time  range)  morphine 4 MG/ML injection 4 mg (4 mg Intravenous Given 08/01/18 1219)  ondansetron (ZOFRAN) injection 4 mg (4 mg Intravenous Given 08/01/18 1218)  0.9 %  sodium chloride infusion ( Intravenous New Bag/Given 08/01/18 1223)  iopamidol (ISOVUE-300) 61 % injection 100 mL (100 mLs Intravenous Contrast Given 08/01/18 1416)    Mobility walks Low fall risk   Focused Assessments Cardiac Assessment  Handoff:  Cardiac Rhythm: Sinus tachycardia Lab Results  Component Value Date   CKTOTAL 160 06/07/2010   CKMB 1.5 06/07/2010   TROPONINI 0.03 (HH) 09/23/2017   Lab Results  Component Value Date   DDIMER (H) 06/07/2010    2.32        AT THE INHOUSE ESTABLISHED CUTOFF VALUE OF 0.48 ug/mL FEU, THIS ASSAY HAS BEEN DOCUMENTED IN THE LITERATURE TO HAVE A SENSITIVITY AND NEGATIVE PREDICTIVE VALUE OF AT LEAST 98 TO 99%.  THE TEST RESULT SHOULD BE CORRELATED WITH AN ASSESSMENT OF THE CLINICAL PROBABILITY OF DVT / VTE.   Does the Patient currently have chest pain? No     R Recommendations: See Admitting Provider Note  Report given to:   Additional Notes:

## 2018-08-01 NOTE — H&P (Addendum)
Date: 08/01/2018               Patient Name:  [name] [name] MRN: [id number]  DOB: 1965/12/24 Age / Sex: 53 y.o., male   PCP: Patient, No Pcp Per         Medical Service: Internal Medicine Teaching Service         Attending Physician: Dr. Rebeca Alert, Raynaldo Opitz, MD    First Contact: Dr. Laural Golden Pager: [phone]  Second Contact: Dr. Reesa Chew Pager: [phone]       After Hours (After 5p/  First Contact Pager: [phone]  weekends / holidays): Second Contact Pager: [phone]   Chief Complaint: Left flank pain  History of Present Illness: [name] [name] is a 53 year old man with CAD s/p CABG, CHF, ESRD on HD, on Coumadin secondary to aortic valve replacement presenting with left sided abdominal and flank pain. He recently presented to the ED on 4/5 with hematuria and urgency, which is unusual for him because he is anuric. At that time he underwent a CT renal stone study which showed heterogeneous high density within the bladder suggestive of blood clot vs bladder mass. There was also evidence of uncomplicated diverticulitis. He was discharged home on cipro and flagyl for a presumed UTI and diverticulitis. Since that time his hematuria mostly resolved but he had progressive left side abdominal and flank pain. He states the pain was so severe he had to leave HD early on Saturday. He was able to complete HD yesterday; however, the pain was so severe he came to the hospital. He states he is having pain from his left knee all the way to his left shoulder. He denies any nausea, vomiting, or fevers. He is no longer experiencing the sense of urgency but has continued to have a few drops of hematuria. He states he has been on HD for about 18 years secondary to hypertension.    ED course: On arrival, he was tachypneic and tachycardic. Hgb 8.9, dropped from 12.9 last week. INR 7.3. CT abdomen shows Interval significant change in appearance of the left kidney compared with previous CT of 9 days ago. The left kidney is  enlarged with heterogeneous high density consistent with bleeding into one or more of the complex left renal cysts. In addition, there is a moderate size left perinephric hematoma. He was admitted to IMT[facility] for serial CBC monitoring and pain control.   Meds:  Current Meds  Medication Sig  . amiodarone (PACERONE) 200 MG tablet Take 1 tablet (200 mg total) by mouth daily.  [name] aspirin EC 81 MG EC tablet Take 1 tablet (81 mg total) by mouth daily.  [name] atorvastatin (LIPITOR) 80 MG tablet Take 1 tablet (80 mg total) by mouth daily at 6 PM.  . B Complex-C-Folic Acid (NEPHRO-VITE PO) Take 1 tablet by mouth daily.   . ciprofloxacin (CIPRO) 500 MG tablet Take 1 tablet (500 mg total) by mouth daily. Take after dialysis  . FOSRENOL 500 MG chewable tablet Chew 1,500-2,000 mg by mouth See admin instructions. Take 2000 mg by mouth with meals and take 1500 mg by mouth with snacks  . metoprolol tartrate (LOPRESSOR) 25 MG tablet TAKE 1 TABLET BY MOUTH TWICE DAILY (Patient taking differently: Take 25 mg by mouth 2 (two) times daily. (BETA BLOCKER))  . metroNIDAZOLE (FLAGYL) 500 MG tablet Take 1 tablet (500 mg total) by mouth 3 (three) times daily.  . midodrine (PROAMATINE) 5 MG tablet Take 3 tablets (15 mg total)  by mouth 3 (three) times daily with meals.  . warfarin (COUMADIN) 5 MG tablet Take 1 tablet daily except 1/2 tablet on Tuesdays, Thursdays and Saturdays or as directed by coumadin clinic (Patient taking differently: Take 5 mg by mouth daily. )     Allergies: Allergies as of 08/01/2018  . (No Known Allergies)   Past Medical History:  Diagnosis Date  . Aneurysm ([place])     Right arm fistula 3 aneurysms 2011,   plans to have a new procedure  . Angina   . Chest discomfort   . CHF (congestive heart failure) ([place])   . Coronary artery disease   . Coronary artery disease involving native coronary artery of native heart without angina pectoris   . Dialysis patient ([place])   . Dysrhythmia   . ESRD (end stage  renal disease) ([place])    on hemodialysis [place]  . Hypertension   . Leg pain   . Mitral regurgitation   . Morbid obesity ([place])   . Orthostatic hypotension   . Overweight(278.02)   . Renal insufficiency   . 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 Severe aortic insufficiency   . Sinus tachycardia   . Syncope     positional after dialysis... 2008    Family History:  Family History  Problem Relation Age of Onset  . Hypertension Mother   . Heart disease Mother   . Hypertension Father   . Heart disease Father    Social History: He lives at home with his wife, able to perform all his ADL's. Uses dip every few days. Denies alcohol or illicit drug use.   Social History   Socioeconomic History  . Marital status: Married    Spouse name: Not on file  . Number of children: Not on file  . Years of education: Not on file  . Highest education level: Not on file  Occupational History  . Occupation: disabled  Social Needs  . Financial resource strain: Not on file  . Food insecurity:    Worry: Not on file    Inability: Not on file  . Transportation needs:    Medical: Not on file    Non-medical: Not on file  Tobacco Use  . Smoking status: Former Smoker    Types: Cigarettes    Last attempt to quit: 04/18/1994    Years since quitting: 24.3  . Smokeless tobacco: Current User    Types: Snuff  . Tobacco comment: dips 1/2 snuff per day times 25 years  Substance and Sexual Activity  . Alcohol use: No  . Drug use: No  . Sexual activity: Not on file  Lifestyle  . Physical activity:    Days per week: Not on file    Minutes per session: Not on file  . Stress: Not on file  Relationships  . Social connections:    Talks on phone: Not on file    Gets together: Not on file    Attends religious service: Not on file    Active member of club or organization:  Not on file    Attends meetings of clubs or organizations: Not on file    Relationship status: Not on file  . Intimate partner violence:    Fear of current or ex partner: Not on file    Emotionally abused: Not on file  Physically abused: Not on file    Forced sexual activity: Not on file  Other Topics Concern  . Not on file  Social History Narrative  . Not on file   Review of Systems: A complete ROS was negative except as per HPI.   Physical Exam: Blood pressure 120/66, pulse (!) 106, temperature 98.3 F (36.8 C), temperature source Oral, resp. rate (!) 21, SpO2 100 %.  Physical Exam  Constitutional: He is oriented to person, place, and time and well-developed, well-nourished, and in no distress.  Cardiovascular: Regular rhythm and normal heart sounds. Tachycardia present.  No murmur heard. Pulmonary/Chest: Effort normal and breath sounds normal. No respiratory distress. He has no wheezes.  Abdominal: Soft. Bowel sounds are normal. He exhibits no distension. There is abdominal tenderness.  Musculoskeletal:        General: No tenderness or edema.  Neurological: He is alert and oriented to person, place, and time.  Skin: Skin is warm and dry.  Psychiatric: Mood, memory, affect and judgment normal.    EKG: personally reviewed my interpretation is sinus tachycardia   CXR: personally reviewed my interpretation is no acute cardiopulmonary process.   Assessment & Plan by Problem: Active Problems:   Renal hematoma, left, initial encounter  Mr. Yadir Zentner is a 53 year old man with CAD s/p CABG, CHF, ESRD on HD, on Coumadin secondary to aortic valve replacement presenting with left sided abdominal and flank pain. CT consistent with left sided perinephric hematoma.   Perinephric hematoma: in the setting of polycystic kidney disease, likely ruptured a hemorrhagic cyst with connection to collecting system. He is hemodynamically stable. Urology evaluated the patient and recommended  conservative management with serial H&H. Hemoglobin did drop to 8.9 from 12.9 last week. Can consider holding Coumadin and transitioning to heparin until Hgb stabilizes. If he becomes hemodynamically unstable or has worsening hgb trend will need to consult IR for possible angiography/embolization. Urology also reviewed imaging and suspects intravesical clot, although he will need repeat imaging in a few weeks with possible contrast to ensure there is no underlying malignancy.  - Trend CBC - Cardiac monitoring  - Pain control with hydrocodone-acetaminophen 1-2 tablets q6h prn - Zofran for nausea   Aortic valve replacement on coumadin Supra therapeutic INR: INR 7.3. Give Vitamin K and hold coumadin. Will consider starting heparin once INR is within therapeutic range 2-3.  - Daily PT/INR  ESRD on HD: 2/2 hypertension. Also history of polycystic kidney disease. Right arm fistula. Last HD 4/14. - Nephrology consulted for HD  CAD s/p CABG  Chronic systolic heart failure: Most recent echo 10/2017 EF 30%. Home medications include amiodarone, metoprolol, aspirin and midodrine. Holding aspirin.  Diet: Heart Healthy  VTE ppx: SCDs Code status: DNR  Dispo: Admit patient to Inpatient with expected length of stay greater than 2 midnights.  SignedMike Craze, DO 08/01/2018, 5:59 PM  Pager: [phone]

## 2018-08-01 NOTE — ED Triage Notes (Signed)
Pt reporting shortness of breath a long with left side knee pain with radiation up to the left shoulder. Pt recently seen at [facility] and was requesting our facility. He reports being seen for blood in the urine there. Hx of dialysis. A/O at triage. Speaking clear sentences at triage. NAD.

## 2018-08-01 NOTE — ED Provider Notes (Signed)
[facility] EMERGENCY DEPARTMENT Provider Note   CSN: [id number] Arrival date & time: 08/01/18  1140    History   Chief Complaint Chief Complaint  Patient presents with   Chest Pain   Knee Pain    HPI [name] [name] is a 53 y.o. male.     Pt presents to the ED today with several complaints.  He's has multiple medical problems, including ESRD on HD, CAD, and CHF.  He has had hematuria and decreased urinating for the past 2 weeks.  He went to [facility] and had a CT urogram which showed a bladder mass.  He was also given abx for possible diverticulitis (cipro and flagyl).  Urine was not obtained as he does not urinate much and declined a foley.  The pt is on coumadin for a mechanical valve and INR was therapeutic.  The pt also has sob, but no fever or cough.  He has been going to dialysis as scheduled (Tues, Thurs, Sat).  He has not been eating or drinking much.  He has abdominal pain and also pain when he does urinate.  He also has left knee pain radiating up to his left shoulder.      Past Medical History:  Diagnosis Date   Aneurysm ([place])     Right arm fistula 3 aneurysms 2011,   plans to have a new procedure   Angina    Chest discomfort    CHF (congestive heart failure) ([place])    Coronary artery disease    Coronary artery disease involving native coronary artery of native heart without angina pectoris    Dialysis patient [facility])    Dysrhythmia    ESRD (end stage renal disease) ([place])    on hemodialysis T_T_[id number]   Hypertension    Leg pain    Mitral regurgitation    Morbid obesity (HCC)    Orthostatic hypotension    Overweight(278.02)    Renal insufficiency    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   ICD (implantable cardioverter-defibrillator) pocket hematoma 01/01/2018   Encounter for therapeutic drug monitoring 09/14/2017   S/P aortic valve replacement with bileaflet mechanical valve 09/01/2017   S/P CABG x 1 09/01/2017   Coronary artery disease involving native coronary artery of native heart without angina pectoris    Chronic periodontitis 08/07/2017   Abnormal echocardiogram    Mitral regurgitation    Severe aortic insufficiency    Acute on chronic combined systolic and diastolic heart failure (HCC)    Systolic CHF ([place]) 40/98/1191   AV graft thrombosis, subsequent encounter 07/28/2017   Dermatitis 04/06/2016   End stage renal disease ([place]) 12/22/2011   Sinus tachycardia    ESRD (end stage renal disease) ([place])    Orthostatic hypotension    Chest discomfort    Morbid obesity ([place])    Syncope    Aneurysm ([place])     Past Surgical History:  Procedure Laterality Date   A/V FISTULAGRAM Left 06/30/2016   Procedure: A/V Fistulagram;  Surgeon: Algernon Huxley, MD;  Location: [facility]  INVASIVE CV LAB;  Service: Cardiovascular;  Laterality: Left;   AORTIC VALVE REPLACEMENT N/A 09/01/2017   Procedure: AORTIC VALVE REPLACEMENT (AVR);  Surgeon: [name], MD;  Location: [place];  Service: Open Heart Surgery;  Laterality: N/A;   ARTERIOVENOUS GRAFT PLACEMENT     AV FISTULA PLACEMENT     AV FISTULA PLACEMENT Left 11/18/2015   Procedure: ARTERIOVENOUS (AV) FISTULA CREATION ( RADIOCEPHALIC );  Surgeon: Algernon Huxley, MD;  Location: [facility] ORS;  Service: Vascular;  Laterality: Left;   AV FISTULA PLACEMENT Left 03/23/2016   Procedure: ARTERIOVENOUS (AV) FISTULA CREATION ( REVISION );  Surgeon: Algernon Huxley, MD;  Location: [facility] ORS;  Service: Vascular;  Laterality: Left;   [place] REMOVAL Left 03/23/2016     Procedure: REMOVAL OF ARTERIOVENOUS GORETEX GRAFT ([place]);  Surgeon: Algernon Huxley, MD;  Location: [facility] ORS;  Service: Vascular;  Laterality: Left;   CENTRAL LINE INSERTION Right 09/01/2017   Procedure: FLOROSCOPY GUIDED PLACEMENT OF RIGHT FEMORAL CENTRAL LINE TIMES TWO  AND PLACEMENT OF SWAN GANZ CATHETER;  Surgeon: [name], MD;  Location: [place];  Service: Open Heart Surgery;  Laterality: Right;   CORONARY ARTERY BYPASS GRAFT N/A 09/01/2017   Procedure: CORONARY ARTERY BYPASS GRAFTING (CABG) X 1, USING RIGHT GREATER SAPHENOUS VEIN HARVESTED ENDOSCOPICALLY;  Surgeon: [name], MD;  Location: [place];  Service: Open Heart Surgery;  Laterality: N/A;   DIALYSIS/PERMA CATHETER REMOVAL N/A 08/01/2016   Procedure: Dialysis/Perma Catheter Removal;  Surgeon: Algernon Huxley, MD;  Location: [place] CV LAB;  Service: Cardiovascular;  Laterality: N/A;   HERNIA REPAIR     INSERTION OF DIALYSIS CATHETER  03/01/2011   Procedure: INSERTION OF DIALYSIS CATHETER;  Surgeon: Elam Dutch, MD;  Location: [place];  Service: Vascular;  Laterality: Left;  Exchange of Dialysis Catheter to 27cm 15Fr. Arrow Catheter   INSERTION OF DIALYSIS CATHETER  09/01/2017   Procedure: PLACEMENT OF TRIALYSIS SHORT TERM DIALYSIS CATHETER;  Surgeon: [name], MD;  Location: [facility] OR;  Service: Open Heart Surgery;;   MULTIPLE EXTRACTIONS WITH ALVEOLOPLASTY N/A 08/07/2017   Procedure: Extraction of tooth #2, 7,8,13,14,15,17, and 31 with alveoloplasty and gross debridement of remaining teeth;  Surgeon: [name], DDS;  Location: [place];  Service: Oral Surgery;  Laterality: N/A;   PERIPHERAL VASCULAR CATHETERIZATION N/A 09/01/2014   Procedure: A/V Shuntogram/Fistulagram;  Surgeon: Algernon Huxley, MD;  Location: [place] CV LAB;  Service: Cardiovascular;  Laterality: N/A;   PERIPHERAL VASCULAR CATHETERIZATION Right 09/01/2014   Procedure: Thrombectomy;  Surgeon: Algernon Huxley, MD;  Location: [place] CV  LAB;  Service: Cardiovascular;  Laterality: Right;   PERIPHERAL VASCULAR CATHETERIZATION Right 09/01/2014   Procedure: A/V Shunt Intervention;  Surgeon: Algernon Huxley, MD;  Location: [place] CV LAB;  Service: Cardiovascular;  Laterality: Right;   PERIPHERAL VASCULAR CATHETERIZATION N/A 09/17/2014   Procedure: A/V Shuntogram/Fistulagram;  Surgeon: Algernon Huxley, MD;  Location: [place] CV LAB;  Service: Cardiovascular;  Laterality: N/A;   PERIPHERAL VASCULAR CATHETERIZATION Right 10/17/2014   Procedure: Thrombectomy;  Surgeon: Katha Cabal, MD;  Location: [place] CV LAB;  Service: Cardiovascular;  Laterality: Right;   PERIPHERAL VASCULAR CATHETERIZATION N/A 10/17/2014   Procedure: A/V Shuntogram/Fistulagram;  Surgeon: Katha Cabal, MD;  Location: [place] CV LAB;  Service: Cardiovascular;  Laterality: N/A;   PERIPHERAL VASCULAR CATHETERIZATION N/A 10/17/2014   Procedure: A/V Shunt Intervention;  Surgeon: Katha Cabal, MD;  Location: [place] INVASIVE CV  LAB;  Service: Cardiovascular;  Laterality: N/A;   PERIPHERAL VASCULAR CATHETERIZATION Right 11/04/2014   Procedure: Thrombectomy;  Surgeon: Katha Cabal, MD;  Location: [place] CV LAB;  Service: Cardiovascular;  Laterality: Right;   PERIPHERAL VASCULAR CATHETERIZATION Left 11/04/2014   Procedure: Visceral Venography;  Surgeon: Katha Cabal, MD;  Location: [place] CV LAB;  Service: Cardiovascular;  Laterality: Left;   PERIPHERAL VASCULAR CATHETERIZATION Right 11/27/2014   Procedure: A/V Shuntogram/Fistulagram;  Surgeon: Algernon Huxley, MD;  Location: [place] CV LAB;  Service: Cardiovascular;  Laterality: Right;   PERIPHERAL VASCULAR CATHETERIZATION Right 11/27/2014   Procedure: A/V Shunt Intervention;  Surgeon: Algernon Huxley, MD;  Location: [place] CV LAB;  Service: Cardiovascular;  Laterality: Right;   PERIPHERAL VASCULAR CATHETERIZATION Right 12/31/2014   Procedure: Thrombectomy;  Surgeon:  Algernon Huxley, MD;  Location: [place] CV LAB;  Service: Cardiovascular;  Laterality: Right;   PERIPHERAL VASCULAR CATHETERIZATION Right 01/12/2015   Procedure: A/V Shuntogram/Fistulagram;  Surgeon: Algernon Huxley, MD;  Location: [place] CV LAB;  Service: Cardiovascular;  Laterality: Right;   PERIPHERAL VASCULAR CATHETERIZATION N/A 01/12/2015   Procedure: A/V Shunt Intervention;  Surgeon: Algernon Huxley, MD;  Location: [place] CV LAB;  Service: Cardiovascular;  Laterality: N/A;   PERIPHERAL VASCULAR CATHETERIZATION Right 01/28/2015   Procedure: Thrombectomy;  Surgeon: Algernon Huxley, MD;  Location: [place] CV LAB;  Service: Cardiovascular;  Laterality: Right;   PERIPHERAL VASCULAR CATHETERIZATION N/A 06/08/2015   Procedure: A/V Shuntogram/Fistulagram;  Surgeon: Algernon Huxley, MD;  Location: [place] CV LAB;  Service: Cardiovascular;  Laterality: N/A;   PERIPHERAL VASCULAR CATHETERIZATION N/A 06/08/2015   Procedure: A/V Shunt Intervention;  Surgeon: Algernon Huxley, MD;  Location: [place] CV LAB;  Service: Cardiovascular;  Laterality: N/A;   PERIPHERAL VASCULAR CATHETERIZATION Right 07/06/2015   Procedure: A/V Shuntogram/Fistulagram;  Surgeon: Algernon Huxley, MD;  Location: [place] CV LAB;  Service: Cardiovascular;  Laterality: Right;   PERIPHERAL VASCULAR CATHETERIZATION N/A 07/06/2015   Procedure: A/V Shunt Intervention;  Surgeon: Algernon Huxley, MD;  Location: [place] CV LAB;  Service: Cardiovascular;  Laterality: N/A;   PERIPHERAL VASCULAR CATHETERIZATION N/A 08/04/2015   Procedure: graft declot;  Surgeon: Katha Cabal, MD;  Location: [place] CV LAB;  Service: Cardiovascular;  Laterality: N/A;   PERIPHERAL VASCULAR CATHETERIZATION Right 08/25/2015   Procedure: A/V Shuntogram/Fistulagram;  Surgeon: Katha Cabal, MD;  Location: [place] CV LAB;  Service: Cardiovascular;  Laterality: Right;   PERIPHERAL VASCULAR CATHETERIZATION N/A 11/04/2015    Procedure: Dialysis/Perma Catheter Insertion;  Surgeon: Algernon Huxley, MD;  Location: [place] CV LAB;  Service: Cardiovascular;  Laterality: N/A;   PERIPHERAL VASCULAR CATHETERIZATION Left 12/14/2015   Procedure: A/V Shuntogram/Fistulagram;  Surgeon: Algernon Huxley, MD;  Location: [place] CV LAB;  Service: Cardiovascular;  Laterality: Left;   PERIPHERAL VASCULAR CATHETERIZATION N/A 12/14/2015   Procedure: A/V Shunt Intervention;  Surgeon: Algernon Huxley, MD;  Location: [place] CV LAB;  Service: Cardiovascular;  Laterality: N/A;   PERIPHERAL VASCULAR CATHETERIZATION N/A 12/17/2015   Procedure: Dialysis/Perma Catheter Insertion;  Surgeon: Algernon Huxley, MD;  Location: [place] CV LAB;  Service: Cardiovascular;  Laterality: N/A;   PERIPHERAL VASCULAR CATHETERIZATION Left 12/22/2015   Procedure: Dialysis/Perma Catheter Insertion;  Surgeon: Katha Cabal, MD;  Location: [place] CV LAB;  Service: Cardiovascular;  Laterality: Left;   PERIPHERAL VASCULAR CATHETERIZATION Left 02/29/2016   Procedure: A/V Shuntogram/Fistulagram;  Surgeon: Erskine Squibb  Lucky Cowboy, MD;  Location: [place] CV LAB;  Service: Cardiovascular;  Laterality: Left;   PERIPHERAL VASCULAR CATHETERIZATION N/A 02/29/2016   Procedure: A/V Shunt Intervention;  Surgeon: Algernon Huxley, MD;  Location: [place] CV LAB;  Service: Cardiovascular;  Laterality: N/A;   POCKET REVISION/RELOCATION N/A 01/01/2018   Procedure: POCKET REVISION/RELOCATION;  Surgeon: Evans Lance, MD;  Location: [place] CV LAB;  Service: Cardiovascular;  Laterality: N/A;   right arm graft     for dyalisis   RIGHT/LEFT HEART CATH AND CORONARY ANGIOGRAPHY N/A 08/02/2017   Procedure: RIGHT/LEFT HEART CATH AND CORONARY ANGIOGRAPHY;  Surgeon: Larey Dresser, MD;  Location: [place] CV LAB;  Service: Cardiovascular;  Laterality: N/A;   SUBQ ICD IMPLANT N/A 11/28/2017   Procedure: SUBQ ICD IMPLANT;  Surgeon: Evans Lance, MD;  Location: [place] CV LAB;  Service: Cardiovascular;  Laterality: N/A;   SUBQ ICD REVISION N/A 02/12/2018   Procedure: SUBQ ICD REVISION(REMOVAL);  Surgeon: Evans Lance, MD;  Location: [place] CV LAB;  Service: Cardiovascular;  Laterality: N/A;   TEE WITHOUT CARDIOVERSION N/A 07/31/2017   Procedure: TRANSESOPHAGEAL ECHOCARDIOGRAM (TEE);  Surgeon: Josue Hector, MD;  Location: [facility] ENDOSCOPY;  Service: Cardiovascular;  Laterality: N/A;   TEE WITHOUT CARDIOVERSION N/A 09/01/2017   Procedure: TRANSESOPHAGEAL ECHOCARDIOGRAM (TEE);  Surgeon: [name], MD;  Location: [place];  Service: Open Heart Surgery;  Laterality: N/A;   THROMBECTOMY     THROMBECTOMY W/ EMBOLECTOMY  03/01/2011   Procedure: THROMBECTOMY ARTERIOVENOUS GORE-TEX GRAFT;  Surgeon: Elam Dutch, MD;  Location: [place];  Service: Vascular;  Laterality: Right;  Attempted Thrombectomy of Old  Right Upper Arm Arteriovenous gortex Graft. Insertion of new Arteriovenous Graft using 40mm x 50cm Gortex Stretch graft.    THROMBECTOMY W/ EMBOLECTOMY  07/11/2011   Procedure: THROMBECTOMY ARTERIOVENOUS GORE-TEX GRAFT;  Surgeon: Rosetta Posner, MD;  Location: [facility] OR;  Service: Vascular;  Laterality: Right;   UMBILICAL HERNIA REPAIR     VENOGRAM N/A 08/23/2011   Procedure: VENOGRAM;  Surgeon: [name], MD;  Location: [facility] CATH LAB;  Service: Cardiovascular;  Laterality: N/A;        Home Medications    Prior to Admission medications   Medication Sig Start Date End Date Taking? Authorizing Provider  amiodarone (PACERONE) 200 MG tablet Take 1 tablet (200 mg total) by mouth daily. 01/16/18  Yes Evans Lance, MD  aspirin EC 81 MG EC tablet Take 1 tablet (81 mg total) by mouth daily. 09/11/17  Yes Barrett, Erin R, PA-C  atorvastatin (LIPITOR) 80 MG tablet Take 1 tablet (80 mg total) by mouth daily at 6 PM. 10/31/17  Yes Larey Dresser, MD  B Complex-C-Folic Acid (NEPHRO-VITE PO) Take 1 tablet by mouth daily.    Yes [provider]    ciprofloxacin (CIPRO) 500 MG tablet Take 1 tablet (500 mg total) by mouth daily. Take after dialysis 07/23/18  Yes Pollina, Gwenyth Allegra, MD  FOSRENOL 500 MG chewable tablet Chew 1,500-2,000 mg by mouth See admin instructions. Take 2000 mg by mouth with meals and take 1500 mg by mouth with snacks 05/28/13  Yes [provider]  metoprolol tartrate (LOPRESSOR) 25 MG tablet TAKE 1 TABLET BY MOUTH TWICE DAILY Patient taking differently: Take 25 mg by mouth 2 (two) times daily. (BETA BLOCKER) 04/02/18  Yes Herminio Commons, MD  metroNIDAZOLE (FLAGYL) 500 MG tablet Take 1 tablet (500 mg total) by mouth 3 (three) times daily. 07/23/18  Yes Pollina, Gwenyth Allegra, MD  midodrine (PROAMATINE) 5 MG tablet Take 3 tablets (15 mg total) by mouth 3 (three) times daily with meals. 01/09/18  Yes Evans Lance, MD  warfarin (COUMADIN) 5 MG tablet Take 1 tablet daily except 1/2 tablet on Tuesdays, Thursdays and Saturdays or as directed by coumadin clinic Patient taking differently: Take 5 mg by mouth daily.  09/14/17  Yes Herminio Commons, MD  acetaminophen-codeine (TYLENOL #3) 300-30 MG tablet Take 1-2 tablets by mouth every 6 (six) hours as needed for moderate pain. Patient not taking: Reported on 08/01/2018 01/05/18   Evans Lance, MD  HYDROcodone-acetaminophen (NORCO/VICODIN) 5-325 MG tablet Take 1-2 tablets by mouth every 4 (four) hours as needed. Patient not taking: Reported on 08/01/2018 07/23/18   Orpah Greek, MD  oxyCODONE (OXY IR/ROXICODONE) 5 MG immediate release tablet Take 1 tablet (5 mg total) by mouth every 4 (four) hours as needed for severe pain. Patient not taking: Reported on 08/01/2018 09/10/17   Barrett, Lodema Hong, PA-C    Family History Family History  Problem Relation Age of Onset   Hypertension Mother    Heart disease Mother    Hypertension Father    Heart disease Father     Social History Social History   Tobacco Use   Smoking status: Former Smoker     Types: Cigarettes    Last attempt to quit: 04/18/1994    Years since quitting: 24.3   Smokeless tobacco: Current User    Types: Snuff   Tobacco comment: dips 1/2 snuff per day times 25 years  Substance Use Topics   Alcohol use: No   Drug use: No     Allergies   Patient has no known allergies.   Review of Systems Review of Systems  Respiratory: Positive for shortness of breath.   Gastrointestinal: Positive for abdominal pain.  Genitourinary: Positive for decreased urine volume, dysuria and hematuria.  Musculoskeletal:       Left knee pain  All other systems reviewed and are negative.    Physical Exam Updated Vital Signs BP 120/66    Pulse (!) 106    Temp 98.3 F (36.8 C) (Oral)    Resp (!) 21    SpO2 100%   Physical Exam Vitals signs and nursing note reviewed.  Constitutional:      Appearance: He is well-developed.  HENT:     Head: Normocephalic and atraumatic.  Eyes:     Extraocular Movements: Extraocular movements intact.     Pupils: Pupils are equal, round, and reactive to light.  Neck:     Musculoskeletal: Normal range of motion and neck supple.  Cardiovascular:     Rate and Rhythm: Regular rhythm. Tachycardia present.     Heart sounds: Normal heart sounds.  Pulmonary:     Effort: Pulmonary effort is normal.     Breath sounds: Normal breath sounds.  Chest:     Comments: Healing wound bottom of CABG scar Abdominal:     General: Bowel sounds are normal.     Palpations: Abdomen is soft.     Tenderness: There is abdominal tenderness in the right upper quadrant, epigastric area and suprapubic area.  Musculoskeletal: Normal range of motion.     Comments: Working AVF in left wrist.  Good thrill.  Skin:    General: Skin is warm.     Capillary Refill: Capillary refill takes less than 2 seconds.  Neurological:     General: No focal deficit present.  Mental Status: He is alert and oriented to person, place, and time.  Psychiatric:        Mood and Affect:  Mood normal.        Behavior: Behavior normal.      ED Treatments / Results  Labs (all labs ordered are listed, but only abnormal results are displayed) Labs Reviewed  CBC WITH DIFFERENTIAL/PLATELET - Abnormal; Notable for the following components:      Result Value   WBC 15.0 (*)    RBC 3.51 (*)    Hemoglobin 8.9 (*)    HCT 29.5 (*)    MCH 25.4 (*)    RDW 16.0 (*)    Neutro Abs 13.2 (*)    Lymphs Abs 0.6 (*)    Abs Immature Granulocytes 0.14 (*)    All other components within normal limits  BRAIN NATRIURETIC PEPTIDE - Abnormal; Notable for the following components:   B Natriuretic Peptide 155.8 (*)    All other components within normal limits  LIPASE, BLOOD - Abnormal; Notable for the following components:   Lipase 62 (*)    All other components within normal limits  COMPREHENSIVE METABOLIC PANEL - Abnormal; Notable for the following components:   Potassium 3.4 (*)    Chloride 97 (*)    Glucose, Bld 110 (*)    BUN 33 (*)    Creatinine, Ser 8.03 (*)    Calcium 7.9 (*)    Albumin 2.5 (*)    Total Bilirubin 1.3 (*)    GFR calc non Af Amer 7 (*)    GFR calc Af Amer 8 (*)    All other components within normal limits  LIPASE, BLOOD - Abnormal; Notable for the following components:   Lipase 61 (*)    All other components within normal limits  COMPREHENSIVE METABOLIC PANEL - Abnormal; Notable for the following components:   Chloride 97 (*)    Glucose, Bld 107 (*)    BUN 33 (*)    Creatinine, Ser 7.95 (*)    Calcium 7.8 (*)    Albumin 2.3 (*)    Total Bilirubin 1.3 (*)    GFR calc non Af Amer 7 (*)    GFR calc Af Amer 8 (*)    All other components within normal limits  PROTIME-INR - Abnormal; Notable for the following components:   Prothrombin Time 61.4 (*)    INR 7.3 (*)    All other components within normal limits  URINE CULTURE  URINALYSIS, ROUTINE W REFLEX MICROSCOPIC  HIV ANTIBODY (ROUTINE TESTING W REFLEX)  CBC  TYPE AND SCREEN    EKG EKG  Interpretation  Date/Time:  Wednesday August 01 2018 11:50:43 EDT Ventricular Rate:  114 PR Interval:    QRS Duration: 105 QT Interval:  345 QTC Calculation: 476 R Axis:   15 Text Interpretation:  Sinus tachycardia Low voltage, precordial leads Abnormal T, consider ischemia, lateral leads Since last tracing rate faster Confirmed by Isla Pence [phone]) on 08/01/2018 12:26:28 PM   Radiology Ct Abdomen Pelvis W Contrast  Result Date: 08/01/2018 CLINICAL DATA:  Hematuria this morning. On hemodialysis. History of anticoagulation for aortic valve replacement. EXAM: CT ABDOMEN AND PELVIS WITH CONTRAST TECHNIQUE: Multidetector CT imaging of the abdomen and pelvis was performed using the standard protocol following bolus administration of intravenous contrast. CONTRAST:  133mL ISOVUE-300 IOPAMIDOL (ISOVUE-300) INJECTION 61% COMPARISON:  Abdominopelvic CT 07/23/2018 FINDINGS: Lower chest: Mild atelectasis at the left lung base. No significant pleural or pericardial effusion. Previous median sternotomy  and bilateral gynecomastia noted. Hepatobiliary: The liver is normal in density without suspicious focal abnormality. There is a small calcified gallstone. No gallbladder wall thickening or biliary dilatation. Pancreas: Unremarkable. No pancreatic ductal dilatation or surrounding inflammatory changes. Spleen: Normal in size without focal abnormality. Adrenals/Urinary Tract: Both adrenal glands appear normal. Both kidneys demonstrate enlargement and numerous complex cysts consistent with adult polycystic kidney disease. Compared with the recent prior study, the right kidney appears unchanged. There has been significant change in appearance of the left kidney however. There is a new heterogeneous, predominately high density mass involving the left kidney consistent with a hematoma. This measures up to 15.0 x 13.1 cm transverse on image 31/5 and extends 18.3 cm in height (image 98/8). In addition, there is a new  left perinephric hematoma which measures up to 3.2 cm in thickness. There is probable clot within the bladder lumen as before. No hydronephrosis. Stomach/Bowel: No evidence of bowel wall thickening, distention or surrounding inflammatory change. Sigmoid diverticulosis. Vascular/Lymphatic: There are no enlarged abdominal or pelvic lymph nodes. Aortic and branch vessel atherosclerosis without acute vascular findings. Reproductive: The prostate gland and seminal vesicles appear normal. Other: Stable periumbilical hernia containing only fat. No ascites or free intraperitoneal air. Musculoskeletal: No acute or significant osseous findings. There are extensive endplate degenerative changes throughout the spine consistent with renal osteodystrophy. IMPRESSION: 1. Interval significant change in appearance of the left kidney compared with previous CT of 9 days ago. The left kidney is enlarged with heterogeneous high density consistent with bleeding into one or more of the complex left renal cysts. In addition, there is a moderate size left perinephric hematoma. 2. Underlying changes of adult polycystic kidney disease bilaterally. 3. Dependent high density in the bladder again noted, likely blood clot. 4. Other incidental findings are stable, including sigmoid diverticulosis, a periumbilical hernia containing only fat and Aortic Atherosclerosis (ICD10-I70.0). Electronically Signed   By: Richardean Sale M.D.   On: 08/01/2018 14:40   Dg Chest Port 1 View  Result Date: 08/01/2018 CLINICAL DATA:  Shortness of breath. History of aortic valve replacement and CABG. EXAM: PORTABLE CHEST 1 VIEW COMPARISON:  01/05/2018. FINDINGS: 1200 hours. Lordotic positioning. The heart size and mediastinal contours are stable status post median sternotomy and CABG. The lungs are clear. There is no pleural effusion or pneumothorax. Right axillary vascular stent again noted. IMPRESSION: Stable chest.  No active cardiopulmonary process.  Electronically Signed   By: Richardean Sale M.D.   On: 08/01/2018 13:03    Procedures Procedures (including critical care time)  Medications Ordered in ED Medications  sodium chloride flush (NS) 0.9 % injection 3 mL (has no administration in time range)  acetaminophen (TYLENOL) tablet 650 mg (has no administration in time range)    Or  acetaminophen (TYLENOL) suppository 650 mg (has no administration in time range)  polyethylene glycol (MIRALAX / GLYCOLAX) packet 17 g (has no administration in time range)  ondansetron (ZOFRAN) tablet 4 mg (has no administration in time range)    Or  ondansetron (ZOFRAN) injection 4 mg (has no administration in time range)  morphine 4 MG/ML injection 4 mg (4 mg Intravenous Given 08/01/18 1219)  ondansetron (ZOFRAN) injection 4 mg (4 mg Intravenous Given 08/01/18 1218)  0.9 %  sodium chloride infusion ( Intravenous New Bag/Given 08/01/18 1223)  iopamidol (ISOVUE-300) 61 % injection 100 mL (100 mLs Intravenous Contrast Given 08/01/18 1416)     Initial Impression / Assessment and Plan / ED Course  I have reviewed the  triage vital signs and the nursing notes.  Pertinent labs & imaging results that were available during my care of the patient were reviewed by me and considered in my medical decision making (see chart for details).     Pt's hgb has dropped to 8.9 from 12.9 on 4/6.    Pt's O2 sat dropped to 87% on RA, so he was put on 2L via [place].  Oxygen is much improved.  Pt d/w Dr. Alinda Money (urology).  At this point, he said no emergent intervention by him is needed, but he will come see him.  He recommends serial h/h.  INR is elevated to 7.3.  He has a mechanical heart valve, so needs anticoagulation.  I will defer to admitting team as to what they want to do about elevated INR.   Pt d/w IMTS for admission.  CRITICAL CARE Performed by: Isla Pence   Total critical care time: 30 minutes  Critical care time was exclusive of separately billable  procedures and treating other patients.  Critical care was necessary to treat or prevent imminent or life-threatening deterioration.  Critical care was time spent personally by me on the following activities: development of treatment plan with patient and/or surrogate as well as nursing, discussions with consultants, evaluation of patient's response to treatment, examination of patient, obtaining history from patient or surrogate, ordering and performing treatments and interventions, ordering and review of laboratory studies, ordering and review of radiographic studies, pulse oximetry and re-evaluation of patient's condition.  Final Clinical Impressions(s) / ED Diagnoses   Final diagnoses:  ESRD on hemodialysis ([place])  Perinephric hematoma  Polycystic kidney disease  Acute post-hemorrhagic anemia  Supratherapeutic INR    ED Discharge Orders    None       Isla Pence, MD 08/01/18 1704

## 2018-08-01 NOTE — ED Notes (Signed)
Pt oxygen sats dropped to 87% on room air, 2L [place] placed and increased to 100%

## 2018-08-01 NOTE — ED Notes (Signed)
LAB called to report INR hemolized

## 2018-08-01 NOTE — Progress Notes (Signed)
NEW ADMISSION NOTE New Admission Note:   Arrival Method: Patient arrived on stretcher from ED. Mental Orientation:  Alert and oriented x 4. Telemetry:  12M-21, ST Assessment: Completed Skin:  Intact, warm and dry except open wound on the upper chest mid incision due to previous surgery, drsg intact. IV:  Right  AC Saline lock. Pain:  8/10, administered Vicodin. Tubes: N/A Safety Measures: Safety Fall Prevention Plan has been given, discussed and signed Admission: In process. [facility] [place] Orientation: Patient has been orientated to the room, unit and staff.   Orders have been reviewed and implemented. Will continue to monitor the patient. Call light has been placed within reach and bed alarm has been activated.   Amaryllis Dyke, RN

## 2018-08-01 NOTE — Consult Note (Signed)
Urology Consult Note   Requesting Attending Physician:  Isla Pence, MD Service Providing Consult: Urology  Consulting Attending: Dr. Alinda Money   Reason for Consult:  gross hematuria, perinephric hematoma.  HPI: [name] [name] is seen in consultation for reasons noted above at the request of Isla Pence, MD for evaluation of gross hematuria, perinephric hematoma.  This is a 53 y.o. male with significant medical comorbidities including ESRD on hemodialysis (for ~18 years), HTN, CAD, CHF, prior CABG, prior mechanical heart valve (on Coumadin).   Most recent HPI began 9 days ago where patient noticed gross hematuria, bladder urgency. This has never occurred previously. He is typically completely anuric as well. He was evaluated at [facility]. He had a CT scan which demonstrated intravesical blood clot as well as diverticulitis. He was treated for presumed UTI And diverticulitis with cipro and flagyl. His hematuria resolved with treatment although he began having progressive left flank and left sided abdominal discomfort.   This prompted ED visit today at [facility]. He denies further hematuria. He does endorse significant left flank pain. Denies N/V, fevers. He had +dizziness last night and fell last night in the kitchen.   In ED he was hemodynamically stable. He does have tachycardia between 107-113 and slight hypotension of 107/57. He is asymptomatic in this regard, only complaint of left flank pain. Labwork notable for a hemoglon of 8.9 (from 12.9 last week). His INR is 2.3 (on Coumadin).  I reviewed his CT imaging today, as well as imaging last week. He has developed a large complex left perinephric hematoma in the interval between scans. He likely ruptured a hemorrhagic cyst with contiguity with collecting system. He does not appear to have an underlying malignant appearing mass based on initial scan (although this was non-con).    Past Medical History: Past Medical History:  Diagnosis Date   . Aneurysm ([place])     Right arm fistula 3 aneurysms 2011,   plans to have a new procedure  . Angina   . Chest discomfort   . CHF (congestive heart failure) ([place])   . Coronary artery disease   . Coronary artery disease involving native coronary artery of native heart without angina pectoris   . Dialysis patient ([place])   . Dysrhythmia   . ESRD (end stage renal disease) ([place])    on hemodialysis [id number]  . Hypertension   . Leg pain   . Mitral regurgitation   . Morbid obesity ([place])   . Orthostatic hypotension   . Overweight(278.02)   . Renal insufficiency   . 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 Severe aortic insufficiency   . Sinus tachycardia   . Syncope     positional after dialysis... 2008    Past Surgical History:  Past Surgical History:  Procedure Laterality Date  . A/V FISTULAGRAM Left 06/30/2016   Procedure: A/V Fistulagram;  Surgeon: Algernon Huxley, MD;  Location: [place] [facility];  Service: Cardiovascular;  Laterality: Left;  . AORTIC VALVE REPLACEMENT N/A 09/01/2017   Procedure: AORTIC VALVE REPLACEMENT (AVR);  Surgeon: [name], MD;  Location: [place];  Service: Open Heart Surgery;  Laterality: N/A;  . ARTERIOVENOUS GRAFT PLACEMENT    . AV FISTULA PLACEMENT    . AV FISTULA PLACEMENT Left 11/18/2015   Procedure: ARTERIOVENOUS (AV) FISTULA CREATION ( RADIOCEPHALIC );  Surgeon:  Algernon Huxley, MD;  Location: [facility];  Service: Vascular;  Laterality: Left;  . AV FISTULA PLACEMENT Left 03/23/2016   Procedure: ARTERIOVENOUS (AV) FISTULA CREATION ( REVISION );  Surgeon: Algernon Huxley, MD;  Location: [facility];  Service: Vascular;  Laterality: Left;  . [place] REMOVAL Left 03/23/2016   Procedure: REMOVAL OF ARTERIOVENOUS GORETEX GRAFT ([place]);  Surgeon: Algernon Huxley, MD;  Location: [facility];  Service: Vascular;  Laterality: Left;  . CENTRAL LINE  INSERTION Right 09/01/2017   Procedure: FLOROSCOPY GUIDED PLACEMENT OF RIGHT FEMORAL CENTRAL LINE TIMES TWO  AND PLACEMENT OF SWAN GANZ CATHETER;  Surgeon: [name], MD;  Location: [place];  Service: Open Heart Surgery;  Laterality: Right;  . CORONARY ARTERY BYPASS GRAFT N/A 09/01/2017   Procedure: CORONARY ARTERY BYPASS GRAFTING (CABG) X 1, USING RIGHT GREATER SAPHENOUS VEIN HARVESTED ENDOSCOPICALLY;  Surgeon: [name], MD;  Location: [place];  Service: Open Heart Surgery;  Laterality: N/A;  . DIALYSIS/PERMA CATHETER REMOVAL N/A 08/01/2016   Procedure: Dialysis/Perma Catheter Removal;  Surgeon: Algernon Huxley, MD;  Location: [place] [facility];  Service: Cardiovascular;  Laterality: N/A;  . HERNIA REPAIR    . INSERTION OF DIALYSIS CATHETER  03/01/2011   Procedure: INSERTION OF DIALYSIS CATHETER;  Surgeon: Elam Dutch, MD;  Location: [place];  Service: Vascular;  Laterality: Left;  Exchange of Dialysis Catheter to 27cm 15Fr. Arrow Catheter  . INSERTION OF DIALYSIS CATHETER  09/01/2017   Procedure: PLACEMENT OF TRIALYSIS SHORT TERM DIALYSIS CATHETER;  Surgeon: [name], MD;  Location: [place] [facility];  Service: Open Heart Surgery;;  . MULTIPLE EXTRACTIONS WITH ALVEOLOPLASTY N/A 08/07/2017   Procedure: Extraction of tooth #2, 7,8,13,14,15,17, and 31 with alveoloplasty and gross debridement of remaining teeth;  Surgeon: [name], DDS;  Location: [place];  Service: Oral Surgery;  Laterality: N/A;  . PERIPHERAL VASCULAR CATHETERIZATION N/A 09/01/2014   Procedure: A/V Shuntogram/Fistulagram;  Surgeon: Algernon Huxley, MD;  Location: [place] [facility];  Service: Cardiovascular;  Laterality: N/A;  . PERIPHERAL VASCULAR CATHETERIZATION Right 09/01/2014   Procedure: Thrombectomy;  Surgeon: Algernon Huxley, MD;  Location: [place] [facility];  Service: Cardiovascular;  Laterality: Right;  . PERIPHERAL VASCULAR CATHETERIZATION Right 09/01/2014   Procedure: A/V Shunt Intervention;  Surgeon: Algernon Huxley,  MD;  Location: [place] [facility];  Service: Cardiovascular;  Laterality: Right;  . PERIPHERAL VASCULAR CATHETERIZATION N/A 09/17/2014   Procedure: A/V Shuntogram/Fistulagram;  Surgeon: Algernon Huxley, MD;  Location: [place] [facility];  Service: Cardiovascular;  Laterality: N/A;  . PERIPHERAL VASCULAR CATHETERIZATION Right 10/17/2014   Procedure: Thrombectomy;  Surgeon: Katha Cabal, MD;  Location: [place] [facility];  Service: Cardiovascular;  Laterality: Right;  . PERIPHERAL VASCULAR CATHETERIZATION N/A 10/17/2014   Procedure: A/V Shuntogram/Fistulagram;  Surgeon: Katha Cabal, MD;  Location: [place] [facility];  Service: Cardiovascular;  Laterality: N/A;  . PERIPHERAL VASCULAR CATHETERIZATION N/A 10/17/2014   Procedure: A/V Shunt Intervention;  Surgeon: Katha Cabal, MD;  Location: [place] [facility];  Service: Cardiovascular;  Laterality: N/A;  . PERIPHERAL VASCULAR CATHETERIZATION Right 11/04/2014   Procedure: Thrombectomy;  Surgeon: Katha Cabal, MD;  Location: [place] [facility];  Service: Cardiovascular;  Laterality: Right;  . PERIPHERAL VASCULAR CATHETERIZATION Left 11/04/2014   Procedure: Visceral Venography;  Surgeon: Katha Cabal, MD;  Location: [place] [facility];  Service: Cardiovascular;  Laterality: Left;  . PERIPHERAL VASCULAR CATHETERIZATION Right 11/27/2014  Procedure: A/V Shuntogram/Fistulagram;  Surgeon: Algernon Huxley, MD;  Location: [place] [facility];  Service: Cardiovascular;  Laterality: Right;  . PERIPHERAL VASCULAR CATHETERIZATION Right 11/27/2014   Procedure: A/V Shunt Intervention;  Surgeon: Algernon Huxley, MD;  Location: [place] [facility];  Service: Cardiovascular;  Laterality: Right;  . PERIPHERAL VASCULAR CATHETERIZATION Right 12/31/2014   Procedure: Thrombectomy;  Surgeon: Algernon Huxley, MD;  Location: [place] [facility];  Service: Cardiovascular;  Laterality: Right;  . PERIPHERAL VASCULAR CATHETERIZATION Right 01/12/2015   Procedure: A/V  Shuntogram/Fistulagram;  Surgeon: Algernon Huxley, MD;  Location: [place] [facility];  Service: Cardiovascular;  Laterality: Right;  . PERIPHERAL VASCULAR CATHETERIZATION N/A 01/12/2015   Procedure: A/V Shunt Intervention;  Surgeon: Algernon Huxley, MD;  Location: [place] [facility];  Service: Cardiovascular;  Laterality: N/A;  . PERIPHERAL VASCULAR CATHETERIZATION Right 01/28/2015   Procedure: Thrombectomy;  Surgeon: Algernon Huxley, MD;  Location: [place] [facility];  Service: Cardiovascular;  Laterality: Right;  . PERIPHERAL VASCULAR CATHETERIZATION N/A 06/08/2015   Procedure: A/V Shuntogram/Fistulagram;  Surgeon: Algernon Huxley, MD;  Location: [place] [facility];  Service: Cardiovascular;  Laterality: N/A;  . PERIPHERAL VASCULAR CATHETERIZATION N/A 06/08/2015   Procedure: A/V Shunt Intervention;  Surgeon: Algernon Huxley, MD;  Location: [place] [facility];  Service: Cardiovascular;  Laterality: N/A;  . PERIPHERAL VASCULAR CATHETERIZATION Right 07/06/2015   Procedure: A/V Shuntogram/Fistulagram;  Surgeon: Algernon Huxley, MD;  Location: [place] [facility];  Service: Cardiovascular;  Laterality: Right;  . PERIPHERAL VASCULAR CATHETERIZATION N/A 07/06/2015   Procedure: A/V Shunt Intervention;  Surgeon: Algernon Huxley, MD;  Location: [place] [facility];  Service: Cardiovascular;  Laterality: N/A;  . PERIPHERAL VASCULAR CATHETERIZATION N/A 08/04/2015   Procedure: graft declot;  Surgeon: Katha Cabal, MD;  Location: [place] [facility];  Service: Cardiovascular;  Laterality: N/A;  . PERIPHERAL VASCULAR CATHETERIZATION Right 08/25/2015   Procedure: A/V Shuntogram/Fistulagram;  Surgeon: Katha Cabal, MD;  Location: [place] [facility];  Service: Cardiovascular;  Laterality: Right;  . PERIPHERAL VASCULAR CATHETERIZATION N/A 11/04/2015   Procedure: Dialysis/Perma Catheter Insertion;  Surgeon: Algernon Huxley, MD;  Location: [place] [facility];  Service: Cardiovascular;  Laterality: N/A;  . PERIPHERAL VASCULAR  CATHETERIZATION Left 12/14/2015   Procedure: A/V Shuntogram/Fistulagram;  Surgeon: Algernon Huxley, MD;  Location: [place] [facility];  Service: Cardiovascular;  Laterality: Left;  . PERIPHERAL VASCULAR CATHETERIZATION N/A 12/14/2015   Procedure: A/V Shunt Intervention;  Surgeon: Algernon Huxley, MD;  Location: [place] [facility];  Service: Cardiovascular;  Laterality: N/A;  . PERIPHERAL VASCULAR CATHETERIZATION N/A 12/17/2015   Procedure: Dialysis/Perma Catheter Insertion;  Surgeon: Algernon Huxley, MD;  Location: [place] [facility];  Service: Cardiovascular;  Laterality: N/A;  . PERIPHERAL VASCULAR CATHETERIZATION Left 12/22/2015   Procedure: Dialysis/Perma Catheter Insertion;  Surgeon: Katha Cabal, MD;  Location: [place] [facility];  Service: Cardiovascular;  Laterality: Left;  . PERIPHERAL VASCULAR CATHETERIZATION Left 02/29/2016   Procedure: A/V Shuntogram/Fistulagram;  Surgeon: Algernon Huxley, MD;  Location: [place] [facility];  Service: Cardiovascular;  Laterality: Left;  . PERIPHERAL VASCULAR CATHETERIZATION N/A 02/29/2016   Procedure: A/V Shunt Intervention;  Surgeon: Algernon Huxley, MD;  Location: [place] [facility];  Service: Cardiovascular;  Laterality: N/A;  . POCKET REVISION/RELOCATION N/A 01/01/2018   Procedure: POCKET REVISION/RELOCATION;  Surgeon: Evans Lance, MD;  Location: [place] [facility];  Service: Cardiovascular;  Laterality: N/A;  .  right arm graft     for dyalisis  . RIGHT/LEFT HEART CATH AND CORONARY ANGIOGRAPHY N/A 08/02/2017   Procedure: RIGHT/LEFT HEART CATH AND CORONARY ANGIOGRAPHY;  Surgeon: Larey [place], MD;  Location: [place] [facility];  Service: Cardiovascular;  Laterality: N/A;  . SUBQ ICD IMPLANT N/A 11/28/2017   Procedure: SUBQ ICD IMPLANT;  Surgeon: Evans Lance, MD;  Location: [place] [facility];  Service: Cardiovascular;  Laterality: N/A;  . SUBQ ICD REVISION N/A 02/12/2018   Procedure: SUBQ ICD REVISION(REMOVAL);  Surgeon: Evans Lance, MD;   Location: [place] [facility];  Service: Cardiovascular;  Laterality: N/A;  . TEE WITHOUT CARDIOVERSION N/A 07/31/2017   Procedure: TRANSESOPHAGEAL ECHOCARDIOGRAM (TEE);  Surgeon: Josue Hector, MD;  Location[facility];  Service: Cardiovascular;  Laterality: N/A;  . TEE WITHOUT CARDIOVERSION N/A 09/01/2017   Procedure: TRANSESOPHAGEAL ECHOCARDIOGRAM (TEE);  Surgeon: [name], MD;  Location: [place];  Service: Open Heart Surgery;  Laterality: N/A;  . THROMBECTOMY    . THROMBECTOMY W/ EMBOLECTOMY  03/01/2011   Procedure: THROMBECTOMY ARTERIOVENOUS GORE-TEX GRAFT;  Surgeon: Elam Dutch, MD;  Location: [place];  Service: Vascular;  Laterality: Right;  Attempted Thrombectomy of Old  Right Upper Arm Arteriovenous gortex Graft. Insertion of new Arteriovenous Graft using 53mm x 50cm Gortex Stretch graft.   . THROMBECTOMY W/ EMBOLECTOMY  07/11/2011   Procedure: THROMBECTOMY ARTERIOVENOUS GORE-TEX GRAFT;  Surgeon: Rosetta Posner, MD;  Location: [place];  Service: Vascular;  Laterality: Right;  . UMBILICAL HERNIA REPAIR    . VENOGRAM N/A 08/23/2011   Procedure: VENOGRAM;  Surgeon: [name], MD;  Location: [facility];  Service: Cardiovascular;  Laterality: N/A;    Medication: No current facility-administered medications for this encounter.    Current Outpatient Medications  Medication Sig Dispense Refill  . amiodarone (PACERONE) 200 MG tablet Take 1 tablet (200 mg total) by mouth daily. 90 tablet 3  . aspirin EC 81 MG EC tablet Take 1 tablet (81 mg total) by mouth daily.    [name] atorvastatin (LIPITOR) 80 MG tablet Take 1 tablet (80 mg total) by mouth daily at 6 PM. 90 tablet 3  . B Complex-C-Folic Acid (NEPHRO-VITE PO) Take 1 tablet by mouth daily.     . ciprofloxacin (CIPRO) 500 MG tablet Take 1 tablet (500 mg total) by mouth daily. Take after dialysis 10 tablet 0  . FOSRENOL 500 MG chewable tablet Chew 1,500-2,000 mg by mouth See admin instructions. Take 2000 mg by mouth with meals and take  1500 mg by mouth with snacks    . metoprolol tartrate (LOPRESSOR) 25 MG tablet TAKE 1 TABLET BY MOUTH TWICE DAILY (Patient taking differently: Take 25 mg by mouth 2 (two) times daily. (BETA BLOCKER)) 60 tablet 6  . metroNIDAZOLE (FLAGYL) 500 MG tablet Take 1 tablet (500 mg total) by mouth 3 (three) times daily. 30 tablet 0  . midodrine (PROAMATINE) 5 MG tablet Take 3 tablets (15 mg total) by mouth 3 (three) times daily with meals. 810 tablet 3  . warfarin (COUMADIN) 5 MG tablet Take 1 tablet daily except 1/2 tablet on Tuesdays, Thursdays and Saturdays or as directed by coumadin clinic (Patient taking differently: Take 5 mg by mouth daily. ) 90 tablet 1  . acetaminophen-codeine (TYLENOL #3) 300-30 MG tablet Take 1-2 tablets by mouth every 6 (six) hours as needed for moderate pain. (Patient not taking: Reported on 08/01/2018) 20 tablet 0  . HYDROcodone-acetaminophen (NORCO/VICODIN) 5-325 MG tablet Take  1-2 tablets by mouth every 4 (four) hours as needed. (Patient not taking: Reported on 08/01/2018) 6 tablet 0  . oxyCODONE (OXY IR/ROXICODONE) 5 MG immediate release tablet Take 1 tablet (5 mg total) by mouth every 4 (four) hours as needed for severe pain. (Patient not taking: Reported on 08/01/2018) 30 tablet 0    Allergies: No Known Allergies  Social History: Social History   Tobacco Use  . Smoking status: Former Smoker    Types: Cigarettes    Last attempt to quit: 04/18/1994    Years since quitting: 24.3  . Smokeless tobacco: Current User    Types: Snuff  . Tobacco comment: dips 1/2 snuff per day times 25 years  Substance Use Topics  . Alcohol use: No  . Drug use: No    Family History Family History  Problem Relation Age of Onset  . Hypertension Mother   . Heart disease Mother   . Hypertension Father   . Heart disease Father     Review of Systems 10 systems were reviewed and are negative except as noted specifically in the HPI.  Objective   Vital signs in last 24 hours: BP (!)  107/57   Pulse (!) 107   Temp 98.3 F (36.8 C) (Oral)   Resp 19   SpO2 100%   Physical Exam General: NAD, A&O, resting, appropriate HEENT: [place]/AT, EOMI, MMM Pulmonary: Normal work of breathing Cardiovascular: HDS, adequate peripheral perfusion Abdomen: Soft, NTTP, nondistended, morbidly obese abdomen. No obvious surgical scars GU: no bladder tenderness. No blood at meatus. He does have left flank tenderness to deep palpation Extremities: warm and well perfused Neuro: Appropriate, no focal neurological deficits  Most Recent Labs: Lab Results  Component Value Date   WBC 15.0 (H) 08/01/2018   HGB 8.9 (L) 08/01/2018   HCT 29.5 (L) 08/01/2018   PLT 356 08/01/2018    Lab Results  Component Value Date   NA 137 08/01/2018   K 3.6 08/01/2018   CL 97 (L) 08/01/2018   CO2 28 08/01/2018   BUN 33 (H) 08/01/2018   CREATININE 7.95 (H) 08/01/2018   CALCIUM 7.8 (L) 08/01/2018   MG 2.0 09/06/2017   PHOS 3.7 09/10/2017    Lab Results  Component Value Date   INR 2.3 (H) 07/23/2018   APTT 39 (H) 09/01/2017     IMAGING: Ct Abdomen Pelvis W Contrast  Result Date: 08/01/2018 CLINICAL DATA:  Hematuria this morning. On hemodialysis. History of anticoagulation for aortic valve replacement. EXAM: CT ABDOMEN AND PELVIS WITH CONTRAST TECHNIQUE: Multidetector CT imaging of the abdomen and pelvis was performed using the standard protocol following bolus administration of intravenous contrast. CONTRAST:  165mL ISOVUE-300 IOPAMIDOL (ISOVUE-300) INJECTION 61% COMPARISON:  Abdominopelvic CT 07/23/2018 FINDINGS: Lower chest: Mild atelectasis at the left lung base. No significant pleural or pericardial effusion. Previous median sternotomy and bilateral gynecomastia noted. Hepatobiliary: The liver is normal in density without suspicious focal abnormality. There is a small calcified gallstone. No gallbladder wall thickening or biliary dilatation. Pancreas: Unremarkable. No pancreatic ductal dilatation or  surrounding inflammatory changes. Spleen: Normal in size without focal abnormality. Adrenals/Urinary Tract: Both adrenal glands appear normal. Both kidneys demonstrate enlargement and numerous complex cysts consistent with adult polycystic kidney disease. Compared with the recent prior study, the right kidney appears unchanged. There has been significant change in appearance of the left kidney however. There is a new heterogeneous, predominately high density mass involving the left kidney consistent with a hematoma. This measures up to 15.0 x  13.1 cm transverse on image 31/5 and extends 18.3 cm in height (image 98/8). In addition, there is a new left perinephric hematoma which measures up to 3.2 cm in thickness. There is probable clot within the bladder lumen as before. No hydronephrosis. Stomach/Bowel: No evidence of bowel wall thickening, distention or surrounding inflammatory change. Sigmoid diverticulosis. Vascular/Lymphatic: There are no enlarged abdominal or pelvic lymph nodes. Aortic and branch vessel atherosclerosis without acute vascular findings. Reproductive: The prostate gland and seminal vesicles appear normal. Other: Stable periumbilical hernia containing only fat. No ascites or free intraperitoneal air. Musculoskeletal: No acute or significant osseous findings. There are extensive endplate degenerative changes throughout the spine consistent with renal osteodystrophy. IMPRESSION: 1. Interval significant change in appearance of the left kidney compared with previous CT of 9 days ago. The left kidney is enlarged with heterogeneous high density consistent with bleeding into one or more of the complex left renal cysts. In addition, there is a moderate size left perinephric hematoma. 2. Underlying changes of adult polycystic kidney disease bilaterally. 3. Dependent high density in the bladder again noted, likely blood clot. 4. Other incidental findings are stable, including sigmoid diverticulosis, a  periumbilical hernia containing only fat and Aortic Atherosclerosis (ICD10-I70.0). Electronically Signed   By: Richardean Sale M.D.   On: 08/01/2018 14:40   Dg Chest Port 1 View  Result Date: 08/01/2018 CLINICAL DATA:  Shortness of breath. History of aortic valve replacement and CABG. EXAM: PORTABLE CHEST 1 VIEW COMPARISON:  01/05/2018. FINDINGS: 1200 hours. Lordotic positioning. The heart size and mediastinal contours are stable status post median sternotomy and CABG. The lungs are clear. There is no pleural effusion or pneumothorax. Right axillary vascular stent again noted. IMPRESSION: Stable chest.  No active cardiopulmonary process. Electronically Signed   By: Richardean Sale M.D.   On: 08/01/2018 13:03    ------  Assessment:  53 y.o. male with significant cardiac comorbidity on chronic Coumadin for AV replacement, who presents with a large left perinephric hematoma in the setting of polycystic kidney disease secondary to 18-year ESRD on HD. He likely ruptured a hemorraghic cyst with connection to the collecting system. As he is currently hemodynamically stable, we would favor conservative management for this initially.    Recommendations: - Agree with hospital admission for monitoring and serial lab work - Recommend serial Hemoglobins and vitals  - Recommend 24 hours of bedrest - Consider holding Coumadin and transitioning to heparin until hemoglobin stabilizes - If patient deteriorates acutely, or if worsening Hgb trend, will need to reach out to IR for possible angiography/embolization - No strong indication for urethral catheter at this time as patient denies significant suprapubic symptoms . He does have intravesical clot although it is okay for bladder to clot and reabsorb, as he is completely anuric at baseline - Will need to consider further imaging in several weeks with possible contrast load to ensure no underlying malignant renal mass as source of bleed  Thank you for this  consult. Please contact the urology consult pager with any further questions/concerns.

## 2018-08-02 DIAGNOSIS — S3681XA Injury of peritoneum, initial encounter: Secondary | ICD-10-CM

## 2018-08-02 DIAGNOSIS — D62 Acute posthemorrhagic anemia: Secondary | ICD-10-CM

## 2018-08-02 DIAGNOSIS — Q613 Polycystic kidney, unspecified: Secondary | ICD-10-CM

## 2018-08-02 DIAGNOSIS — Z952 Presence of prosthetic heart valve: Secondary | ICD-10-CM

## 2018-08-02 DIAGNOSIS — N186 End stage renal disease: Secondary | ICD-10-CM

## 2018-08-02 DIAGNOSIS — S37012A Minor contusion of left kidney, initial encounter: Secondary | ICD-10-CM

## 2018-08-02 DIAGNOSIS — Z79899 Other long term (current) drug therapy: Secondary | ICD-10-CM

## 2018-08-02 DIAGNOSIS — Z7982 Long term (current) use of aspirin: Secondary | ICD-10-CM

## 2018-08-02 DIAGNOSIS — Z7901 Long term (current) use of anticoagulants: Secondary | ICD-10-CM

## 2018-08-02 DIAGNOSIS — Z9889 Other specified postprocedural states: Secondary | ICD-10-CM

## 2018-08-02 DIAGNOSIS — I132 Hypertensive heart and chronic kidney disease with heart failure and with stage 5 chronic kidney disease, or end stage renal disease: Secondary | ICD-10-CM

## 2018-08-02 DIAGNOSIS — Z992 Dependence on renal dialysis: Secondary | ICD-10-CM

## 2018-08-02 DIAGNOSIS — I5022 Chronic systolic (congestive) heart failure: Secondary | ICD-10-CM

## 2018-08-02 DIAGNOSIS — I2510 Atherosclerotic heart disease of native coronary artery without angina pectoris: Secondary | ICD-10-CM

## 2018-08-02 DIAGNOSIS — X58XXXA Exposure to other specified factors, initial encounter: Secondary | ICD-10-CM

## 2018-08-02 DIAGNOSIS — Z951 Presence of aortocoronary bypass graft: Secondary | ICD-10-CM

## 2018-08-02 DIAGNOSIS — R791 Abnormal coagulation profile: Secondary | ICD-10-CM

## 2018-08-02 LAB — PROTIME-INR
INR: 7.3 (ref 0.8–1.2)
INR: 8.7 (ref 0.8–1.2)
INR: 2 — ABNORMAL HIGH (ref 0.8–1.2)
Prothrombin Time: 61.4 seconds — ABNORMAL HIGH (ref 11.4–15.2)
Prothrombin Time: 70 seconds — ABNORMAL HIGH (ref 11.4–15.2)
Prothrombin Time: 22.6 seconds — ABNORMAL HIGH (ref 11.4–15.2)

## 2018-08-02 LAB — RENAL FUNCTION PANEL
Albumin: 2.2 g/dL — ABNORMAL LOW (ref 3.5–5.0)
Anion gap: 15 (ref 5–15)
BUN: 42 mg/dL — ABNORMAL HIGH (ref 6–20)
CO2: 25 mmol/L (ref 22–32)
Calcium: 7.6 mg/dL — ABNORMAL LOW (ref 8.9–10.3)
Chloride: 94 mmol/L — ABNORMAL LOW (ref 98–111)
Creatinine, Ser: 8.88 mg/dL — ABNORMAL HIGH (ref 0.61–1.24)
GFR calc Af Amer: 7 mL/min — ABNORMAL LOW (ref >60)
GFR calc non Af Amer: 6 mL/min — ABNORMAL LOW (ref >60)
Glucose, Bld: 118 mg/dL — ABNORMAL HIGH (ref 70–99)
Phosphorus: 5.6 mg/dL — ABNORMAL HIGH (ref 2.5–4.6)
Potassium: 4.3 mmol/L (ref 3.5–5.1)
Sodium: 134 mmol/L — ABNORMAL LOW (ref 135–145)

## 2018-08-02 LAB — CBC
HCT: 25.7 % — ABNORMAL LOW (ref 39.0–52.0)
HCT: 24.5 % — ABNORMAL LOW (ref 39.0–52.0)
Hemoglobin: 7.5 g/dL — ABNORMAL LOW (ref 13.0–17.0)
Hemoglobin: 7.4 g/dL — ABNORMAL LOW (ref 13.0–17.0)
MCH: 25.1 pg — ABNORMAL LOW (ref 26.0–34.0)
MCH: 24.9 pg — ABNORMAL LOW (ref 26.0–34.0)
MCHC: 29.2 g/dL — ABNORMAL LOW (ref 30.0–36.0)
MCHC: 30.2 g/dL (ref 30.0–36.0)
MCV: 86 fL (ref 80.0–100.0)
MCV: 82.5 fL (ref 80.0–100.0)
Platelets: 396 10*3/uL (ref 150–400)
Platelets: 356 10*3/uL (ref 150–400)
RBC: 2.99 MIL/uL — ABNORMAL LOW (ref 4.22–5.81)
RBC: 2.97 MIL/uL — ABNORMAL LOW (ref 4.22–5.81)
RDW: 16.1 % — ABNORMAL HIGH (ref 11.5–15.5)
RDW: 16.4 % — ABNORMAL HIGH (ref 11.5–15.5)
WBC: 20.6 10*3/uL — ABNORMAL HIGH (ref 4.0–10.5)
WBC: 23.2 10*3/uL — ABNORMAL HIGH (ref 4.0–10.5)
nRBC: 0 % (ref 0.0–0.2)
nRBC: 0 % (ref 0.0–0.2)

## 2018-08-02 LAB — CBC WITH DIFFERENTIAL/PLATELET
Abs Immature Granulocytes: 0.18 10*3/uL — ABNORMAL HIGH (ref 0.00–0.07)
Basophils Absolute: 0.1 10*3/uL (ref 0.0–0.1)
Basophils Relative: 0 %
Eosinophils Absolute: 0.2 10*3/uL (ref 0.0–0.5)
Eosinophils Relative: 1 %
HCT: 25.6 % — ABNORMAL LOW (ref 39.0–52.0)
Hemoglobin: 7.7 g/dL — ABNORMAL LOW (ref 13.0–17.0)
Immature Granulocytes: 1 %
Lymphocytes Relative: 4 %
Lymphs Abs: 0.8 10*3/uL (ref 0.7–4.0)
MCH: 25.8 pg — ABNORMAL LOW (ref 26.0–34.0)
MCHC: 30.1 g/dL (ref 30.0–36.0)
MCV: 85.6 fL (ref 80.0–100.0)
Monocytes Absolute: 1.3 10*3/uL — ABNORMAL HIGH (ref 0.1–1.0)
Monocytes Relative: 7 %
Neutro Abs: 17.7 10*3/uL — ABNORMAL HIGH (ref 1.7–7.7)
Neutrophils Relative %: 87 %
Platelets: 411 10*3/uL — ABNORMAL HIGH (ref 150–400)
RBC: 2.99 MIL/uL — ABNORMAL LOW (ref 4.22–5.81)
RDW: 16 % — ABNORMAL HIGH (ref 11.5–15.5)
WBC: 20.3 10*3/uL — ABNORMAL HIGH (ref 4.0–10.5)
nRBC: 0 % (ref 0.0–0.2)

## 2018-08-02 LAB — PREPARE RBC (CROSSMATCH)

## 2018-08-02 LAB — HIV ANTIBODY (ROUTINE TESTING W REFLEX): HIV Screen 4th Generation wRfx: NONREACTIVE

## 2018-08-02 MED ORDER — VITAMIN K1 10 MG/ML IJ SOLN
10.0000 mg | Freq: Once | INTRAVENOUS | Status: DC
  Filled 2018-08-02: qty 1

## 2018-08-02 MED ORDER — CHLORHEXIDINE GLUCONATE CLOTH 2 % EX PADS
6.0000 | MEDICATED_PAD | Freq: Every day | CUTANEOUS | Status: DC
  Administered 2018-08-03 – 2018-08-04 (×2): 6 via TOPICAL

## 2018-08-02 MED ORDER — DOXERCALCIFEROL 4 MCG/2ML IV SOLN
INTRAVENOUS | Status: AC
  Administered 2018-08-02: 1.5 ug via INTRAVENOUS
  Filled 2018-08-02: qty 2

## 2018-08-02 MED ORDER — MIDODRINE HCL 5 MG PO TABS
ORAL_TABLET | ORAL | Status: AC
  Filled 2018-08-02: qty 3

## 2018-08-02 MED ORDER — SODIUM CHLORIDE 0.9% IV SOLUTION: Freq: Once | INTRAVENOUS | Status: DC

## 2018-08-02 MED ORDER — HYDROCODONE-ACETAMINOPHEN 5-325 MG PO TABS
ORAL_TABLET | ORAL | Status: AC
  Filled 2018-08-02: qty 2

## 2018-08-02 MED ORDER — VITAMIN K1 10 MG/ML IJ SOLN
10.0000 mg | Freq: Once | INTRAVENOUS | Status: AC
  Administered 2018-08-02: 10 mg via INTRAVENOUS
  Filled 2018-08-02: qty 1

## 2018-08-02 MED ORDER — CHLORHEXIDINE GLUCONATE CLOTH 2 % EX PADS
6.0000 | MEDICATED_PAD | Freq: Every day | CUTANEOUS | Status: AC
  Administered 2018-08-02 – 2018-08-06 (×3): 6 via TOPICAL

## 2018-08-02 MED ORDER — MUPIROCIN 2 % EX OINT
1.0000 "application " | TOPICAL_OINTMENT | Freq: Two times a day (BID) | CUTANEOUS | Status: AC
  Administered 2018-08-02 – 2018-08-06 (×8): 1 via NASAL
  Filled 2018-08-02: qty 22

## 2018-08-02 MED ORDER — DOXERCALCIFEROL 4 MCG/2ML IV SOLN
1.5000 ug | INTRAVENOUS | Status: DC
  Administered 2018-08-02 – 2018-08-07 (×3): 1.5 ug via INTRAVENOUS

## 2018-08-02 NOTE — Consult Note (Addendum)
Reason for Consult: Continuity of ESRD care Referring Physician: Lenice Pressman, MD (IMTS)  HPI:  53 year old African-American man with past medical history significant for hypertension, coronary artery disease status post CABG, congestive heart failure, history of aortic valve replacement on chronic anticoagulation with warfarin and end-stage renal disease on hemodialysis.  Presented to the emergency room with increasing left flank pain about 10 days after being empirically treated at the [facility] ER for possible urinary tract infection when he developed hematuria and urinary urgency.  He denies any fevers or chills and presented to the emergency room on account of having worsening pain and an episode of syncope 2 days ago.  Denies any nausea, vomiting or diarrhea but reports poor appetite.  He was found to have a supratherapeutic INR of 7.3 with CT scan of the abdomen showing bleeding into a complex cyst of the left kidney with moderate size left perinephric hematoma.  Hemodialysis prescription: TTS, [facility], 4 hours 45 minutes, left forearm AVF (15G), BFR 450, DFR 800, 2K/2.5 calcium. Heparin 2000IU load and 500U/hr (stop 1 hour prior to end). EDW 134kg. Hectorol 1.79mcg TIW, Parsabiv 2.5mg  TIW  Past Medical History:  Diagnosis Date  . Aneurysm ([place])     Right arm fistula 3 aneurysms 2011,   plans to have a new procedure  . Angina   . Chest discomfort   . CHF (congestive heart failure) ([place])   . Coronary artery disease   . Coronary artery disease involving native coronary artery of native heart without angina pectoris   . Dialysis patient ([place])   . Dysrhythmia   . ESRD (end stage renal disease) ([place])    on hemodialysis T_T_S  . Hypertension   . Leg pain   . Mitral regurgitation   . Morbid obesity ([place])   . Orthostatic hypotension   . Overweight(278.02)   . Renal insufficiency   . S/P aortic valve replacement with bileaflet mechanical valve 09/01/2017   25 mm Sorin Carbomedics  [place] bileaflet mechanical valve  . 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 Severe aortic insufficiency   . Sinus tachycardia   . Syncope     positional after dialysis... 2008    Past Surgical History:  Procedure Laterality Date  . A/V FISTULAGRAM Left 06/30/2016   Procedure: A/V Fistulagram;  Surgeon: Algernon Huxley, MD;  Location: [place] [facility];  Service: Cardiovascular;  Laterality: Left;  . AORTIC VALVE REPLACEMENT N/A 09/01/2017   Procedure: AORTIC VALVE REPLACEMENT (AVR);  Surgeon: [name], MD;  Location: [place];  Service: Open Heart Surgery;  Laterality: N/A;  . ARTERIOVENOUS GRAFT PLACEMENT    . AV FISTULA PLACEMENT    . AV FISTULA PLACEMENT Left 11/18/2015   Procedure: ARTERIOVENOUS (AV) FISTULA CREATION ( RADIOCEPHALIC );  Surgeon: Algernon Huxley, MD;  Location: [facility];  Service: Vascular;  Laterality: Left;  . AV FISTULA PLACEMENT Left 03/23/2016   Procedure: ARTERIOVENOUS (AV) FISTULA CREATION ( REVISION );  Surgeon: Algernon Huxley, MD;  Location: [facility];  Service: Vascular;  Laterality: Left;  . [place] REMOVAL Left 03/23/2016   Procedure: REMOVAL OF ARTERIOVENOUS GORETEX GRAFT ([place]);  Surgeon: Algernon Huxley, MD;  Location: [facility];  Service: Vascular;  Laterality: Left;  . CENTRAL LINE INSERTION Right 09/01/2017   Procedure: FLOROSCOPY GUIDED PLACEMENT OF RIGHT FEMORAL CENTRAL LINE TIMES TWO  AND PLACEMENT OF SWAN GANZ CATHETER;  Surgeon: [name], MD;  Location: [place];  Service: Open  Heart Surgery;  Laterality: Right;  . CORONARY ARTERY BYPASS GRAFT N/A 09/01/2017   Procedure: CORONARY ARTERY BYPASS GRAFTING (CABG) X 1, USING RIGHT GREATER SAPHENOUS VEIN HARVESTED ENDOSCOPICALLY;  Surgeon: [name], MD;  Location: [place];  Service: Open Heart Surgery;  Laterality: N/A;  . DIALYSIS/PERMA CATHETER REMOVAL N/A 08/01/2016   Procedure: Dialysis/Perma Catheter Removal;  Surgeon: Algernon Huxley, MD;  Location: [place] [facility];  Service: Cardiovascular;  Laterality:  N/A;  . HERNIA REPAIR    . INSERTION OF DIALYSIS CATHETER  03/01/2011   Procedure: INSERTION OF DIALYSIS CATHETER;  Surgeon: Elam Dutch, MD;  Location: [place];  Service: Vascular;  Laterality: Left;  Exchange of Dialysis Catheter to 27cm 15Fr. Arrow Catheter  . INSERTION OF DIALYSIS CATHETER  09/01/2017   Procedure: PLACEMENT OF TRIALYSIS SHORT TERM DIALYSIS CATHETER;  Surgeon: [name], MD;  Location: [place] [facility];  Service: Open Heart Surgery;;  . MULTIPLE EXTRACTIONS WITH ALVEOLOPLASTY N/A 08/07/2017   Procedure: Extraction of tooth #2, 7,8,13,14,15,17, and 31 with alveoloplasty and gross debridement of remaining teeth;  Surgeon: [name], DDS;  Location: [place];  Service: Oral Surgery;  Laterality: N/A;  . PERIPHERAL VASCULAR CATHETERIZATION N/A 09/01/2014   Procedure: A/V Shuntogram/Fistulagram;  Surgeon: Algernon Huxley, MD;  Location: [place] [facility];  Service: Cardiovascular;  Laterality: N/A;  . PERIPHERAL VASCULAR CATHETERIZATION Right 09/01/2014   Procedure: Thrombectomy;  Surgeon: Algernon Huxley, MD;  Location: [place] [facility];  Service: Cardiovascular;  Laterality: Right;  . PERIPHERAL VASCULAR CATHETERIZATION Right 09/01/2014   Procedure: A/V Shunt Intervention;  Surgeon: Algernon Huxley, MD;  Location: [place] [facility];  Service: Cardiovascular;  Laterality: Right;  . PERIPHERAL VASCULAR CATHETERIZATION N/A 09/17/2014   Procedure: A/V Shuntogram/Fistulagram;  Surgeon: Algernon Huxley, MD;  Location: [place] [facility];  Service: Cardiovascular;  Laterality: N/A;  . PERIPHERAL VASCULAR CATHETERIZATION Right 10/17/2014   Procedure: Thrombectomy;  Surgeon: Katha Cabal, MD;  Location: [place] [facility];  Service: Cardiovascular;  Laterality: Right;  . PERIPHERAL VASCULAR CATHETERIZATION N/A 10/17/2014   Procedure: A/V Shuntogram/Fistulagram;  Surgeon: Katha Cabal, MD;  Location: [place] [facility];  Service: Cardiovascular;  Laterality: N/A;  . PERIPHERAL VASCULAR  CATHETERIZATION N/A 10/17/2014   Procedure: A/V Shunt Intervention;  Surgeon: Katha Cabal, MD;  Location: [place] [facility];  Service: Cardiovascular;  Laterality: N/A;  . PERIPHERAL VASCULAR CATHETERIZATION Right 11/04/2014   Procedure: Thrombectomy;  Surgeon: Katha Cabal, MD;  Location: [place] [facility];  Service: Cardiovascular;  Laterality: Right;  . PERIPHERAL VASCULAR CATHETERIZATION Left 11/04/2014   Procedure: Visceral Venography;  Surgeon: Katha Cabal, MD;  Location: [place] [facility];  Service: Cardiovascular;  Laterality: Left;  . PERIPHERAL VASCULAR CATHETERIZATION Right 11/27/2014   Procedure: A/V Shuntogram/Fistulagram;  Surgeon: Algernon Huxley, MD;  Location: [place] [facility];  Service: Cardiovascular;  Laterality: Right;  . PERIPHERAL VASCULAR CATHETERIZATION Right 11/27/2014   Procedure: A/V Shunt Intervention;  Surgeon: Algernon Huxley, MD;  Location: [place] [facility];  Service: Cardiovascular;  Laterality: Right;  . PERIPHERAL VASCULAR CATHETERIZATION Right 12/31/2014   Procedure: Thrombectomy;  Surgeon: Algernon Huxley, MD;  Location: [place] [facility];  Service: Cardiovascular;  Laterality: Right;  . PERIPHERAL VASCULAR CATHETERIZATION Right 01/12/2015   Procedure: A/V Shuntogram/Fistulagram;  Surgeon: Algernon Huxley, MD;  Location: [place] [facility];  Service: Cardiovascular;  Laterality: Right;  . PERIPHERAL VASCULAR CATHETERIZATION N/A 01/12/2015  Procedure: A/V Shunt Intervention;  Surgeon: Algernon Huxley, MD;  Location: [place] [facility];  Service: Cardiovascular;  Laterality: N/A;  . PERIPHERAL VASCULAR CATHETERIZATION Right 01/28/2015   Procedure: Thrombectomy;  Surgeon: Algernon Huxley, MD;  Location: [place] [facility];  Service: Cardiovascular;  Laterality: Right;  . PERIPHERAL VASCULAR CATHETERIZATION N/A 06/08/2015   Procedure: A/V Shuntogram/Fistulagram;  Surgeon: Algernon Huxley, MD;  Location: [place] [facility];  Service: Cardiovascular;   Laterality: N/A;  . PERIPHERAL VASCULAR CATHETERIZATION N/A 06/08/2015   Procedure: A/V Shunt Intervention;  Surgeon: Algernon Huxley, MD;  Location: [place] [facility];  Service: Cardiovascular;  Laterality: N/A;  . PERIPHERAL VASCULAR CATHETERIZATION Right 07/06/2015   Procedure: A/V Shuntogram/Fistulagram;  Surgeon: Algernon Huxley, MD;  Location: [place] [facility];  Service: Cardiovascular;  Laterality: Right;  . PERIPHERAL VASCULAR CATHETERIZATION N/A 07/06/2015   Procedure: A/V Shunt Intervention;  Surgeon: Algernon Huxley, MD;  Location: [place] [facility];  Service: Cardiovascular;  Laterality: N/A;  . PERIPHERAL VASCULAR CATHETERIZATION N/A 08/04/2015   Procedure: graft declot;  Surgeon: Katha Cabal, MD;  Location: [place] [facility];  Service: Cardiovascular;  Laterality: N/A;  . PERIPHERAL VASCULAR CATHETERIZATION Right 08/25/2015   Procedure: A/V Shuntogram/Fistulagram;  Surgeon: Katha Cabal, MD;  Location: [place] [facility];  Service: Cardiovascular;  Laterality: Right;  . PERIPHERAL VASCULAR CATHETERIZATION N/A 11/04/2015   Procedure: Dialysis/Perma Catheter Insertion;  Surgeon: Algernon Huxley, MD;  Location: [place] [facility];  Service: Cardiovascular;  Laterality: N/A;  . PERIPHERAL VASCULAR CATHETERIZATION Left 12/14/2015   Procedure: A/V Shuntogram/Fistulagram;  Surgeon: Algernon Huxley, MD;  Location: [place] [facility];  Service: Cardiovascular;  Laterality: Left;  . PERIPHERAL VASCULAR CATHETERIZATION N/A 12/14/2015   Procedure: A/V Shunt Intervention;  Surgeon: Algernon Huxley, MD;  Location: [place] [facility];  Service: Cardiovascular;  Laterality: N/A;  . PERIPHERAL VASCULAR CATHETERIZATION N/A 12/17/2015   Procedure: Dialysis/Perma Catheter Insertion;  Surgeon: Algernon Huxley, MD;  Location: [place] [facility];  Service: Cardiovascular;  Laterality: N/A;  . PERIPHERAL VASCULAR CATHETERIZATION Left 12/22/2015   Procedure: Dialysis/Perma Catheter Insertion;  Surgeon: Katha Cabal, MD;  Location: [place] [facility];  Service: Cardiovascular;  Laterality: Left;  . PERIPHERAL VASCULAR CATHETERIZATION Left 02/29/2016   Procedure: A/V Shuntogram/Fistulagram;  Surgeon: Algernon Huxley, MD;  Location: [place] [facility];  Service: Cardiovascular;  Laterality: Left;  . PERIPHERAL VASCULAR CATHETERIZATION N/A 02/29/2016   Procedure: A/V Shunt Intervention;  Surgeon: Algernon Huxley, MD;  Location: [place] [facility];  Service: Cardiovascular;  Laterality: N/A;  . POCKET REVISION/RELOCATION N/A 01/01/2018   Procedure: POCKET REVISION/RELOCATION;  Surgeon: Evans Lance, MD;  Location: [place] [facility];  Service: Cardiovascular;  Laterality: N/A;  . right arm graft     for dyalisis  . RIGHT/LEFT HEART CATH AND CORONARY ANGIOGRAPHY N/A 08/02/2017   Procedure: RIGHT/LEFT HEART CATH AND CORONARY ANGIOGRAPHY;  Surgeon: Larey Dresser, MD;  Location: [place] [facility];  Service: Cardiovascular;  Laterality: N/A;  . SUBQ ICD IMPLANT N/A 11/28/2017   Procedure: SUBQ ICD IMPLANT;  Surgeon: Evans Lance, MD;  Location: [place] [facility];  Service: Cardiovascular;  Laterality: N/A;  . SUBQ ICD REVISION N/A 02/12/2018   Procedure: SUBQ ICD REVISION(REMOVAL);  Surgeon: Evans Lance, MD;  Location: [place] [facility];  Service: Cardiovascular;  Laterality: N/A;  . TEE WITHOUT CARDIOVERSION N/A 07/31/2017   Procedure: TRANSESOPHAGEAL  ECHOCARDIOGRAM (TEE);  Surgeon: Josue Hector, MD;  Location[facility];  Service: Cardiovascular;  Laterality: N/A;  . TEE WITHOUT CARDIOVERSION N/A 09/01/2017   Procedure: TRANSESOPHAGEAL ECHOCARDIOGRAM (TEE);  Surgeon: [name], MD;  Location: [place];  Service: Open Heart Surgery;  Laterality: N/A;  . THROMBECTOMY    . THROMBECTOMY W/ EMBOLECTOMY  03/01/2011   Procedure: THROMBECTOMY ARTERIOVENOUS GORE-TEX GRAFT;  Surgeon: Elam Dutch, MD;  Location: [place];  Service: Vascular;  Laterality: Right;  Attempted Thrombectomy of Old   Right Upper Arm Arteriovenous gortex Graft. Insertion of new Arteriovenous Graft using 58mm x 50cm Gortex Stretch graft.   . THROMBECTOMY W/ EMBOLECTOMY  07/11/2011   Procedure: THROMBECTOMY ARTERIOVENOUS GORE-TEX GRAFT;  Surgeon: Rosetta Posner, MD;  Location: [place];  Service: Vascular;  Laterality: Right;  . UMBILICAL HERNIA REPAIR    . VENOGRAM N/A 08/23/2011   Procedure: VENOGRAM;  Surgeon: [name], MD;  Location[facility];  Service: Cardiovascular;  Laterality: N/A;    Family History  Problem Relation Age of [place]  . Hypertension Mother   . Heart disease Mother   . Hypertension Father   . Heart disease Father     Social History:  reports that he quit smoking about 24 years ago. His smoking use included cigarettes. His smokeless tobacco use includes snuff. He reports that he does not drink alcohol or use drugs.  Allergies: No Known Allergies  Medications:  Scheduled: . sodium chloride   Intravenous Once  . amiodarone  200 mg Oral Daily  . atorvastatin  80 mg Oral q1800  . Chlorhexidine Gluconate Cloth  6 each Topical Q0600  . Chlorhexidine Gluconate Cloth  6 each Topical Q0600  . lanthanum  2,000 mg Oral TID WC  . metoprolol tartrate  25 mg Oral BID  . midodrine  15 mg Oral TID WC  . multivitamin  1 tablet Oral Daily  . mupirocin ointment  1 application Nasal BID  . sodium chloride flush  3 mL Intravenous Q12H    BMP Latest Ref Rng & Units 08/01/2018 08/01/2018 07/23/2018  Glucose 70 - 99 mg/dL 107(H) 110(H) 105(H)  BUN 6 - 20 mg/dL 33(H) 33(H) 52(H)  Creatinine 0.61 - 1.24 mg/dL 7.95(H) 8.03(H) 9.29(H)  BUN/Creat Ratio 9 - 20 - - -  Sodium 135 - 145 mmol/L 137 137 140  Potassium 3.5 - 5.1 mmol/L 3.6 3.4(L) 3.6  Chloride 98 - 111 mmol/L 97(L) 97(L) 99  CO2 22 - 32 mmol/L 28 27 24   Calcium 8.9 - 10.3 mg/dL 7.8(L) 7.9(L) 8.4(L)   CBC Latest Ref Rng & Units 08/02/2018 08/02/2018 08/01/2018  WBC 4.0 - 10.5 K/uL 20.3(H) 20.6(H) 14.2(H)  Hemoglobin 13.0 - 17.0 g/dL 7.7(L)  7.5(L) 7.7(L)  Hematocrit 39.0 - 52.0 % 25.6(L) 25.7(L) 25.2(L)  Platelets 150 - 400 K/uL 411(H) 396 297     Ct Abdomen Pelvis W Contrast  Result Date: 08/01/2018 CLINICAL DATA:  Hematuria this morning. On hemodialysis. History of anticoagulation for aortic valve replacement. EXAM: CT ABDOMEN AND PELVIS WITH CONTRAST TECHNIQUE: Multidetector CT imaging of the abdomen and pelvis was performed using the standard protocol following bolus administration of intravenous contrast. CONTRAST:  127mL ISOVUE-300 IOPAMIDOL (ISOVUE-300) INJECTION 61% COMPARISON:  Abdominopelvic CT 07/23/2018 FINDINGS: Lower chest: Mild atelectasis at the left lung base. No significant pleural or pericardial effusion. Previous median sternotomy and bilateral gynecomastia noted. Hepatobiliary: The liver is normal in density without suspicious focal abnormality. There is a small calcified gallstone. No  gallbladder wall thickening or biliary dilatation. Pancreas: Unremarkable. No pancreatic ductal dilatation or surrounding inflammatory changes. Spleen: Normal in size without focal abnormality. Adrenals/Urinary Tract: Both adrenal glands appear normal. Both kidneys demonstrate enlargement and numerous complex cysts consistent with adult polycystic kidney disease. Compared with the recent prior study, the right kidney appears unchanged. There has been significant change in appearance of the left kidney however. There is a new heterogeneous, predominately high density mass involving the left kidney consistent with a hematoma. This measures up to 15.0 x 13.1 cm transverse on image 31/5 and extends 18.3 cm in height (image 98/8). In addition, there is a new left perinephric hematoma which measures up to 3.2 cm in thickness. There is probable clot within the bladder lumen as before. No hydronephrosis. Stomach/Bowel: No evidence of bowel wall thickening, distention or surrounding inflammatory change. Sigmoid diverticulosis. Vascular/Lymphatic:  There are no enlarged abdominal or pelvic lymph nodes. Aortic and branch vessel atherosclerosis without acute vascular findings. Reproductive: The prostate gland and seminal vesicles appear normal. Other: Stable periumbilical hernia containing only fat. No ascites or free intraperitoneal air. Musculoskeletal: No acute or significant osseous findings. There are extensive endplate degenerative changes throughout the spine consistent with renal osteodystrophy. IMPRESSION: 1. Interval significant change in appearance of the left kidney compared with previous CT of 9 days ago. The left kidney is enlarged with heterogeneous high density consistent with bleeding into one or more of the complex left renal cysts. In addition, there is a moderate size left perinephric hematoma. 2. Underlying changes of adult polycystic kidney disease bilaterally. 3. Dependent high density in the bladder again noted, likely blood clot. 4. Other incidental findings are stable, including sigmoid diverticulosis, a periumbilical hernia containing only fat and Aortic Atherosclerosis (ICD10-I70.0). Electronically Signed   By: Richardean Sale M.D.   On: 08/01/2018 14:40   Dg Chest Port 1 View  Result Date: 08/01/2018 CLINICAL DATA:  Shortness of breath. History of aortic valve replacement and CABG. EXAM: PORTABLE CHEST 1 VIEW COMPARISON:  01/05/2018. FINDINGS: 1200 hours. Lordotic positioning. The heart size and mediastinal contours are stable status post median sternotomy and CABG. The lungs are clear. There is no pleural effusion or pneumothorax. Right axillary vascular stent again noted. IMPRESSION: Stable chest.  No active cardiopulmonary process. Electronically Signed   By: Richardean Sale M.D.   On: 08/01/2018 13:03    Review of Systems  Constitutional: Positive for malaise/fatigue. Negative for chills and fever.  HENT: Negative.   Eyes: Negative.   Respiratory: Negative.   Cardiovascular: Negative.   Gastrointestinal: Positive for  abdominal pain. Negative for diarrhea, heartburn and nausea.  Genitourinary: Positive for flank pain and urgency.  Musculoskeletal: Positive for back pain. Negative for myalgias.  Skin: Negative.   Neurological: Positive for dizziness.  Psychiatric/Behavioral: The patient is not nervous/anxious.    Blood pressure 100/75, pulse (!) 101, temperature 98.5 F (36.9 C), resp. rate 17, height 6' (1.829 m), weight 133 kg, SpO2 100 %. Physical Exam  Nursing note and vitals reviewed. Constitutional: He is oriented to person, place, and time. He appears well-developed and well-nourished.  HENT:  Head: Normocephalic and atraumatic.  Mouth/Throat: Oropharynx is clear and moist.  Eyes: Pupils are equal, round, and reactive to light. Conjunctivae and EOM are normal. No scleral icterus.  Neck: Normal range of motion. Neck supple. No JVD present.  Cardiovascular: Normal rate, regular rhythm and intact distal pulses.  No murmur heard. Respiratory: Effort normal and breath sounds normal. He has no wheezes. He  has no rales.  GI: Soft. Bowel sounds are normal. There is abdominal tenderness. There is no rebound.  LUQ tenderness  Musculoskeletal: Normal range of motion.        General: No edema.     Comments: Left forearm radiocephalic fistula with poor thrill  Neurological: He is alert and oriented to person, place, and time.  Skin: Skin is warm and dry. No rash noted.    Assessment/Plan: 1.  Left perinephric/intracystic hematoma: Ongoing pain management and concomitant treatment of acute blood loss anemia.  It is imperative that he undergo additional evaluation after the acute process is resolved as this might be the presenting symptom of RCC.  Appreciate urology input. 2.  End-stage renal disease: Resume hemodialysis on a TTS schedule.  He appears to be euvolemic on physical exam and does not have any critical electrolyte abnormalities.  Will obtain records from his dialysis unit. 3.  Anemia: With  component of acute blood loss arising from perinephric/intracystic bleed.  PRBCs ordered. 4.  Secondary hyperparathyroidism: We will follow calcium/phosphorus level and obtain records from dialysis unit to reconcile medications for PTH management. 5.  Hypertension: Blood pressures controlled and at this time appears to be on the softer side.  Torianna Junio K. 08/02/2018, 10:31 AM

## 2018-08-02 NOTE — Progress Notes (Signed)
CRITICAL VALUE STICKER  CRITICAL VALUE:    INR=8.7  RECEIVER (on-site recipient of call): Emilio Math, RN   [place] NOTIFIED:  0830   08/02/18   MESSENGER (representative from lab):  MD NOTIFIED: Laural Golden, MD   TIME OF NOTIFICATION : 0831 08/02/18   RESPONSE: notified via phone call

## 2018-08-02 NOTE — Procedures (Signed)
Patient seen on Hemodialysis. BP 100/75 (BP Location: Right Arm)   Pulse (!) 101   Temp 98.5 F (36.9 C)   Resp 17   Ht 6' (1.829 m)   Wt 133 kg   SpO2 100%   BMI 39.77 kg/m   QB 400, UF goal 2.5L Tolerating treatment without complaints at this time.   Elmarie Shiley MD [facility]. Office # [phone] Pager # [phone] 11:58 AM

## 2018-08-02 NOTE — Progress Notes (Addendum)
   Subjective: Patient reports feeling well this morning. States his pain is improved today. Denies any urine output or hematuria. Has not ate much, denies appetite. All questions and concerns addressed.  Objective:  Vital signs in last 24 hours: Vitals:   08/01/18 1700 08/01/18 1800 08/01/18 2050 08/02/18 0454  BP: 120/66  107/79 100/75  Pulse: (!) 106  (!) 103 (!) 101  Resp: (!) 21  16 17   Temp:   98.3 F (36.8 C) 98.5 F (36.9 C)  TempSrc:   Oral   SpO2: 100%  99% 100%  Weight:  133 kg 133 kg   Height:  6' (1.829 m)     Physical Exam Gen: seen comfortably resting in bed, no distress CV: RRR, mechanical click  Abdomen: bowel sounds present, left sided tenderness, soft non distended  Ext: no edema, warm and well perfused   Assessment/Plan:  Active Problems:   Renal hematoma, left, initial encounter  Mr. Royer Cristobal is a 53 year old man with CAD s/p CABG, CHF, ESRD on HD, on Coumadin secondary to aortic valve replacement presenting with left sided abdominal and flank pain. CT consistent with left sided perinephric hematoma.   Perinephric hematoma: in the setting of polycystic kidney disease, likely ruptured a hemorrhagic cyst with connection to collecting system. Hemoglobin 7.5 this morning, from 12.9 last week. Will order 1 unit PRBC.  - 1 unit PRBC with HD  - Follow up post transfusion H&H - Trend CBC - Cardiac monitoring  - Pain control with hydrocodone-acetaminophen 1-2 tablets q6h prn - Zofran for nausea  - If he becomes hemodynamically unstable or has worsening hgb trend will need to consult IR for possible angiography/embolization. He will need repeat imaging in a few weeks with possible contrast to ensure there is no underlying malignancy  Aortic valve replacement on coumadin Supra therapeutic INR: INR increased to 8.7 this morning. Ordered IV vitamin K 10 mg. Holding coumadin. Will consider starting heparin once INR is within therapeutic range 2-3.  - Daily  PT/INR  ESRD on HD: 2/2 hypertension. Also history of polycystic kidney disease. Right arm fistula. Last HD 4/14. - Nephrology consulted for HD, appreciate assistance  - HD today   CAD s/p CABG  Chronic systolic heart failure: Most recent echo 10/2017 EF 30%.  - C/w home medications include amiodarone, metoprolol, aspirin and midodrine - Holding aspirin   Dispo: Anticipated discharge pending clinical improvement.   Mike Craze, DO 08/02/2018, 9:48 AM Pager: [phone]

## 2018-08-02 NOTE — Progress Notes (Signed)
Urology Progress Note    Subjective: [name] [name].  Vitals stable, HR 101 Hgb 7.7 from 7.7 last night, stable INR 8.7 from 7.3 Pain fairly well controlled    Objective: Vital signs in last 24 hours: Temp:  [98.3 F (36.8 C)-98.5 F (36.9 C)] 98.5 F (36.9 C) (04/16 0454) Pulse Rate:  [101-113] 101 (04/16 0454) Resp:  [16-26] 17 (04/16 0454) BP: (100-120)/(57-79) 100/75 (04/16 0454) SpO2:  [95 %-100 %] 100 % (04/16 0454) Weight:  [004 kg] 133 kg (04/15 2050)  Intake/Output from previous day: 04/15 0701 - 04/16 0700 In: 120 [P.O.:120] Out: 0  Intake/Output this shift: No intake/output data recorded.  Physical Exam:  General: Alert and oriented CV: RRR Lungs: Clear Abdomen: Soft, nondistended. Mild left flank discomfort to deep palpation GU: no catheter Ext: NT, No erythema  Lab Results: Recent Labs    08/01/18 1213 08/01/18 1917 08/02/18 0619  HGB 8.9* 7.7* 7.7*  7.5*  HCT 29.5* 25.2* 25.6*  25.7*   BMET Recent Labs    08/01/18 1312 08/01/18 1354  NA 137 137  K 3.4* 3.6  CL 97* 97*  CO2 27 28  GLUCOSE 110* 107*  BUN 33* 33*  CREATININE 8.03* 7.95*  CALCIUM 7.9* 7.8*     Studies/Results: Ct Abdomen Pelvis W Contrast  Result Date: 08/01/2018 CLINICAL DATA:  Hematuria this morning. On hemodialysis. History of anticoagulation for aortic valve replacement. EXAM: CT ABDOMEN AND PELVIS WITH CONTRAST TECHNIQUE: Multidetector CT imaging of the abdomen and pelvis was performed using the standard protocol following bolus administration of intravenous contrast. CONTRAST:  115mL ISOVUE-300 IOPAMIDOL (ISOVUE-300) INJECTION 61% COMPARISON:  Abdominopelvic CT 07/23/2018 FINDINGS: Lower chest: Mild atelectasis at the left lung base. No significant pleural or pericardial effusion. Previous median sternotomy and bilateral gynecomastia noted. Hepatobiliary: The liver is normal in density without suspicious focal abnormality. There is a small calcified gallstone. No gallbladder  wall thickening or biliary dilatation. Pancreas: Unremarkable. No pancreatic ductal dilatation or surrounding inflammatory changes. Spleen: Normal in size without focal abnormality. Adrenals/Urinary Tract: Both adrenal glands appear normal. Both kidneys demonstrate enlargement and numerous complex cysts consistent with adult polycystic kidney disease. Compared with the recent prior study, the right kidney appears unchanged. There has been significant change in appearance of the left kidney however. There is a new heterogeneous, predominately high density mass involving the left kidney consistent with a hematoma. This measures up to 15.0 x 13.1 cm transverse on image 31/5 and extends 18.3 cm in height (image 98/8). In addition, there is a new left perinephric hematoma which measures up to 3.2 cm in thickness. There is probable clot within the bladder lumen as before. No hydronephrosis. Stomach/Bowel: No evidence of bowel wall thickening, distention or surrounding inflammatory change. Sigmoid diverticulosis. Vascular/Lymphatic: There are no enlarged abdominal or pelvic lymph nodes. Aortic and branch vessel atherosclerosis without acute vascular findings. Reproductive: The prostate gland and seminal vesicles appear normal. Other: Stable periumbilical hernia containing only fat. No ascites or free intraperitoneal air. Musculoskeletal: No acute or significant osseous findings. There are extensive endplate degenerative changes throughout the spine consistent with renal osteodystrophy. IMPRESSION: 1. Interval significant change in appearance of the left kidney compared with previous CT of 9 days ago. The left kidney is enlarged with heterogeneous high density consistent with bleeding into one or more of the complex left renal cysts. In addition, there is a moderate size left perinephric hematoma. 2. Underlying changes of adult polycystic kidney disease bilaterally. 3. Dependent high density in the bladder again  noted,  likely blood clot. 4. Other incidental findings are stable, including sigmoid diverticulosis, a periumbilical hernia containing only fat and Aortic Atherosclerosis (ICD10-I70.0). Electronically Signed   By: Richardean Sale M.D.   On: 08/01/2018 14:40   Dg Chest Port 1 View  Result Date: 08/01/2018 CLINICAL DATA:  Shortness of breath. History of aortic valve replacement and CABG. EXAM: PORTABLE CHEST 1 VIEW COMPARISON:  01/05/2018. FINDINGS: 1200 hours. Lordotic positioning. The heart size and mediastinal contours are stable status post median sternotomy and CABG. The lungs are clear. There is no pleural effusion or pneumothorax. Right axillary vascular stent again noted. IMPRESSION: Stable chest.  No active cardiopulmonary process. Electronically Signed   By: Richardean Sale M.D.   On: 08/01/2018 13:03    Assessment/Plan:  53 y.o. male with significant cardiac comorbidity on chronic Coumadin for AV replacement, who presents with a large left perinephric hematoma in the setting of polycystic kidney disease secondary to 18-year ESRD on HD. He likely ruptured a hemorraghic cyst with connection to the collecting system, in the setting of supratherapeutic INR.  - Recommend continued bedrest through tomorrow - IV heparin until hemoglobin stabilizes - If patient deteriorates acutely, or if worsening Hgb trend, will need to reach out to IR for possible angiography/embolization - No strong indication for urethral catheter at this time as patient denies significant suprapubic symptoms . He does have intravesical clot although it is okay for bladder to clot and reabsorb, as he is completely anuric at baseline - Will need to consider further imaging in several weeks with possible contrast load to ensure no underlying malignant renal mass as source of bleed   LOS: 1 day   Fredricka Bonine 08/02/2018, 10:38 AM

## 2018-08-02 NOTE — Progress Notes (Addendum)
  Date: 08/02/2018  Patient name: [name] [name]  Medical record number: [id number]  Date of birth: 07-20-1965   I have seen and evaluated this patient and I have discussed the plan of care with the house staff. Please see their note for complete details. I concur with their findings with the following additions/corrections:   53 year old man with ESRD on HD, CAD with prior CABG, HFrEF, mechanical AVR on warfarin admitted with left perinephric hematoma and supratherapeutic INR.  Appreciate urology consultation, planning conservative management.  Pain control is much improved today, Hgb continues to drop.  Discussed with nephrology, will give 1 unit PRBCs with dialysis today.  Received 10 mg of vitamin K yesterday, INR still elevated today, will give another dose of IV 10 mg of vitamin K.  We will recheck H/H and INR after transfusion.  We will consider starting a heparin drip if INR gets down to 2-2.5.  Lenice Pressman, M.D., Ph.D. 08/02/2018, 3:26 PM

## 2018-08-02 NOTE — Progress Notes (Signed)
ANTICOAGULATION CONSULT NOTE - Initial Consult  Pharmacy Consult for warfarin Indication: mechanical AVR  No Known Allergies  Patient Measurements: Height: 6' (182.9 cm) Weight: 293 lb 3.4 oz (133 kg) IBW/kg (Calculated) : 77.6   Vital Signs: Temp: 98.5 F (36.9 C) (04/16 0454) Temp Source: Oral (04/15 2050) BP: 100/75 (04/16 0454) Pulse Rate: 101 (04/16 0454)  Labs: Recent Labs    08/01/18 1213 08/01/18 1312 08/01/18 1354 08/01/18 1551 08/01/18 1917 08/02/18 0619  HGB 8.9*  --   --   --  7.7* 7.7*  7.5*  HCT 29.5*  --   --   --  25.2* 25.6*  25.7*  PLT 356  --   --   --  297 411*  396  LABPROT  --   --   --  61.4*  --  70.0*  INR  --   --   --  7.3*  --  8.7*  CREATININE  --  8.03* 7.95*  --   --   --     Estimated Creatinine Clearance: 15.2 mL/min (A) (by C-G formula based on SCr of 7.95 mg/dL (H)).   Medical History: Past Medical History:  Diagnosis Date  . Aneurysm ([place])     Right arm fistula 3 aneurysms 2011,   plans to have a new procedure  . Angina   . Chest discomfort   . CHF (congestive heart failure) ([place])   . Coronary artery disease   . Coronary artery disease involving native coronary artery of native heart without angina pectoris   . Dialysis patient ([place])   . Dysrhythmia   . ESRD (end stage renal disease) ([place])    on hemodialysis T_T_S  . Hypertension   . Leg pain   . Mitral regurgitation   . Morbid obesity ([place])   . Orthostatic hypotension   . Overweight(278.02)   . Renal insufficiency   . 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 Severe aortic insufficiency   . Sinus tachycardia   . Syncope     positional after dialysis... 2008    Medications:  Medications Prior to Admission  Medication Sig Dispense Refill Last Dose  . amiodarone (PACERONE) 200 MG tablet Take 1 tablet (200 mg total) by mouth  daily. 90 tablet 3 07/31/2018 at am  . aspirin EC 81 MG EC tablet Take 1 tablet (81 mg total) by mouth daily.   07/31/2018 at Unknown time  . atorvastatin (LIPITOR) 80 MG tablet Take 1 tablet (80 mg total) by mouth daily at 6 PM. 90 tablet 3 07/31/2018 at Unknown time  . B Complex-C-Folic Acid (NEPHRO-VITE PO) Take 1 tablet by mouth daily.    07/31/2018 at Unknown time  . ciprofloxacin (CIPRO) 500 MG tablet Take 1 tablet (500 mg total) by mouth daily. Take after dialysis 10 tablet 0 07/31/2018 at Unknown time  . FOSRENOL 500 MG chewable tablet Chew 1,500-2,000 mg by mouth See admin instructions. Take 2000 mg by mouth with meals and take 1500 mg by mouth with snacks   07/31/2018 at Unknown time  . metoprolol tartrate (LOPRESSOR) 25 MG tablet TAKE 1 TABLET BY MOUTH TWICE DAILY (Patient taking differently: Take 25 mg by mouth 2 (two) times daily. (BETA BLOCKER)) 60 tablet 6 07/31/2018 at 1400  . metroNIDAZOLE (FLAGYL) 500 MG tablet Take 1 tablet (500 mg total) by mouth  3 (three) times daily. 30 tablet 0 07/31/2018 at Unknown time  . midodrine (PROAMATINE) 5 MG tablet Take 3 tablets (15 mg total) by mouth 3 (three) times daily with meals. 810 tablet 3 07/31/2018 at Unknown time  . warfarin (COUMADIN) 5 MG tablet Take 1 tablet daily except 1/2 tablet on Tuesdays, Thursdays and Saturdays or as directed by coumadin clinic (Patient taking differently: Take 5 mg by mouth daily. ) 90 tablet 1 07/31/2018 at 1800  . acetaminophen-codeine (TYLENOL #3) 300-30 MG tablet Take 1-2 tablets by mouth every 6 (six) hours as needed for moderate pain. (Patient not taking: Reported on 08/01/2018) 20 tablet 0 Not Taking at Unknown time  . HYDROcodone-acetaminophen (NORCO/VICODIN) 5-325 MG tablet Take 1-2 tablets by mouth every 4 (four) hours as needed. (Patient not taking: Reported on 08/01/2018) 6 tablet 0 Not Taking at Unknown time  . oxyCODONE (OXY IR/ROXICODONE) 5 MG immediate release tablet Take 1 tablet (5 mg total) by mouth every 4  (four) hours as needed for severe pain. (Patient not taking: Reported on 08/01/2018) 30 tablet 0 Not Taking at Unknown time   Scheduled:  . sodium chloride   Intravenous Once  . amiodarone  200 mg Oral Daily  . atorvastatin  80 mg Oral q1800  . Chlorhexidine Gluconate Cloth  6 each Topical Q0600  . lanthanum  2,000 mg Oral TID WC  . metoprolol tartrate  25 mg Oral BID  . midodrine  15 mg Oral TID WC  . multivitamin  1 tablet Oral Daily  . mupirocin ointment  1 application Nasal BID  . sodium chloride flush  3 mL Intravenous Q12H    Assessment: On Coumadin 5mg  daily exc 2.5mg  on Sun/Wed PTA for mechanical AVR. Goal INR is 2-3 per anticoag clinic note. Now with hematuria. Noted w/ large left perinephric hematoma (likely ruptured a hemorraghic cyst) Possible possible angiography/embolization and may need to start heparin. INR up today to 8.7, vitamin K ordered. Hgb low at 7.5, plts wnl.  Goal of Therapy:  INR 2-3 Monitor platelets by anticoagulation protocol: Yes   Plan:  Hold Coumadin  Monitor daily INR, CBC, s/s of bleed F/u procedural plans Consider heparin when INR < 2.0?  Elenor Quinones, PharmD, BCPS, BCIDP Clinical Pharmacist 08/02/2018 8:48 AM

## 2018-08-03 LAB — CBC
HCT: 23 % — ABNORMAL LOW (ref 39.0–52.0)
HCT: 25.2 % — ABNORMAL LOW (ref 39.0–52.0)
Hemoglobin: 7.1 g/dL — ABNORMAL LOW (ref 13.0–17.0)
Hemoglobin: 7.7 g/dL — ABNORMAL LOW (ref 13.0–17.0)
MCH: 25.8 pg — ABNORMAL LOW (ref 26.0–34.0)
MCH: 25.4 pg — ABNORMAL LOW (ref 26.0–34.0)
MCHC: 30.9 g/dL (ref 30.0–36.0)
MCHC: 30.6 g/dL (ref 30.0–36.0)
MCV: 83.6 fL (ref 80.0–100.0)
MCV: 83.2 fL (ref 80.0–100.0)
Platelets: 384 10*3/uL (ref 150–400)
Platelets: 418 10*3/uL — ABNORMAL HIGH (ref 150–400)
RBC: 2.75 MIL/uL — ABNORMAL LOW (ref 4.22–5.81)
RBC: 3.03 MIL/uL — ABNORMAL LOW (ref 4.22–5.81)
RDW: 16.7 % — ABNORMAL HIGH (ref 11.5–15.5)
RDW: 16.3 % — ABNORMAL HIGH (ref 11.5–15.5)
WBC: 19.6 10*3/uL — ABNORMAL HIGH (ref 4.0–10.5)
WBC: 19.6 10*3/uL — ABNORMAL HIGH (ref 4.0–10.5)
nRBC: 0 % (ref 0.0–0.2)
nRBC: 0 % (ref 0.0–0.2)

## 2018-08-03 LAB — BASIC METABOLIC PANEL
Anion gap: 12 (ref 5–15)
BUN: 34 mg/dL — ABNORMAL HIGH (ref 6–20)
CO2: 28 mmol/L (ref 22–32)
Calcium: 8.1 mg/dL — ABNORMAL LOW (ref 8.9–10.3)
Chloride: 95 mmol/L — ABNORMAL LOW (ref 98–111)
Creatinine, Ser: 6.83 mg/dL — ABNORMAL HIGH (ref 0.61–1.24)
GFR calc Af Amer: 10 mL/min — ABNORMAL LOW (ref >60)
GFR calc non Af Amer: 8 mL/min — ABNORMAL LOW (ref >60)
Glucose, Bld: 101 mg/dL — ABNORMAL HIGH (ref 70–99)
Potassium: 4.1 mmol/L (ref 3.5–5.1)
Sodium: 135 mmol/L (ref 135–145)

## 2018-08-03 LAB — PROTIME-INR
INR: 1.7 — ABNORMAL HIGH (ref 0.8–1.2)
Prothrombin Time: 19.6 seconds — ABNORMAL HIGH (ref 11.4–15.2)

## 2018-08-03 LAB — RENAL FUNCTION PANEL
Albumin: 2.2 g/dL — ABNORMAL LOW (ref 3.5–5.0)
Anion gap: 13 (ref 5–15)
BUN: 33 mg/dL — ABNORMAL HIGH (ref 6–20)
CO2: 28 mmol/L (ref 22–32)
Calcium: 8.1 mg/dL — ABNORMAL LOW (ref 8.9–10.3)
Chloride: 95 mmol/L — ABNORMAL LOW (ref 98–111)
Creatinine, Ser: 6.91 mg/dL — ABNORMAL HIGH (ref 0.61–1.24)
GFR calc Af Amer: 10 mL/min — ABNORMAL LOW (ref >60)
GFR calc non Af Amer: 8 mL/min — ABNORMAL LOW (ref >60)
Glucose, Bld: 95 mg/dL (ref 70–99)
Phosphorus: 6.1 mg/dL — ABNORMAL HIGH (ref 2.5–4.6)
Potassium: 4.1 mmol/L (ref 3.5–5.1)
Sodium: 136 mmol/L (ref 135–145)

## 2018-08-03 LAB — HEPARIN LEVEL (UNFRACTIONATED): Heparin Unfractionated: <0.1 IU/mL — ABNORMAL LOW (ref 0.30–0.70)

## 2018-08-03 LAB — PREPARE RBC (CROSSMATCH)

## 2018-08-03 LAB — HEPATITIS B SURFACE ANTIBODY, QUANTITATIVE: Hep B S AB Quant (Post): 28.8 m[IU]/mL (ref >9.9)

## 2018-08-03 LAB — HEPATITIS B SURFACE ANTIGEN: Hepatitis B Surface Ag: NEGATIVE

## 2018-08-03 LAB — HEPATITIS B CORE ANTIBODY, TOTAL: Hep B Core Total Ab: NEGATIVE

## 2018-08-03 MED ORDER — LIDOCAINE HCL (PF) 1 % IJ SOLN: 5.0000 mL | INTRAMUSCULAR | Status: DC | PRN

## 2018-08-03 MED ORDER — SODIUM CHLORIDE 0.9 % IV SOLN: 100.0000 mL | INTRAVENOUS | Status: DC | PRN

## 2018-08-03 MED ORDER — LIDOCAINE-PRILOCAINE 2.5-2.5 % EX CREA: 1.0000 "application " | TOPICAL_CREAM | CUTANEOUS | Status: DC | PRN

## 2018-08-03 MED ORDER — HEPARIN SODIUM (PORCINE) 1000 UNIT/ML DIALYSIS: 1000.0000 [IU] | INTRAMUSCULAR | Status: DC | PRN

## 2018-08-03 MED ORDER — PENTAFLUOROPROP-TETRAFLUOROETH EX AERO: 1.0000 "application " | INHALATION_SPRAY | CUTANEOUS | Status: DC | PRN

## 2018-08-03 MED ORDER — SODIUM CHLORIDE 0.9 % IV BOLUS
500.0000 mL | Freq: Once | INTRAVENOUS | Status: AC
  Administered 2018-08-03: 500 mL via INTRAVENOUS

## 2018-08-03 MED ORDER — HEPARIN (PORCINE) 25000 UT/250ML-% IV SOLN
2300.0000 [IU]/h | INTRAVENOUS | Status: DC
  Administered 2018-08-03: 1400 [IU]/h via INTRAVENOUS
  Administered 2018-08-04: 1900 [IU]/h via INTRAVENOUS
  Administered 2018-08-04: 1700 [IU]/h via INTRAVENOUS
  Administered 2018-08-05: 2150 [IU]/h via INTRAVENOUS
  Administered 2018-08-05 – 2018-08-07 (×4): 2300 [IU]/h via INTRAVENOUS
  Filled 2018-08-03 (×8): qty 250

## 2018-08-03 MED ORDER — SODIUM CHLORIDE 0.9% IV SOLUTION: Freq: Once | INTRAVENOUS | Status: DC

## 2018-08-03 MED ORDER — SODIUM CHLORIDE 0.9 % IV BOLUS: 500.0000 mL | Freq: Once | INTRAVENOUS | Status: DC

## 2018-08-03 NOTE — Progress Notes (Addendum)
Paged Posey Pronto, MD via [facility] to notify that the unit of blood ordered was about 440 ML instead of the 315 mL standard unit. MD called and stated that based on pt's examine this AM and pt's low BP readings that it was ok for pt to get the 440 mL unit of blood. MD Posey Pronto also gave verbal order to DC 500 mL bolus since pt would be getting blood today.     1700 notified internal med team that pt had a wound on his chest and that pt stated he was seen at the wound center in [place]. Per the wound center pt was to clean wound and apply Neosporin daily. Asked for a WOC consult, the MD stated that the team would assess it tomorrow and make recommendations after.    Paulla Fore, RN

## 2018-08-03 NOTE — Progress Notes (Signed)
ANTICOAGULATION CONSULT NOTE - Initial Consult  Pharmacy Consult for warfarin and heparin Indication: mechanical AVR  No Known Allergies  Patient Measurements: Height: 6' (182.9 cm) Weight: 293 lb 6.9 oz (133.1 kg) IBW/kg (Calculated) : 77.6  Heparin dosing weight: 108 kg   Vital Signs: Temp: 97.9 F (36.6 C) (04/17 1616) Temp Source: Oral (04/17 1616) BP: 88/45 (04/17 1616) Pulse Rate: 105 (04/17 1616)  Labs: Recent Labs    08/01/18 1354  08/02/18 0619 08/02/18 1241 08/02/18 1934 08/03/18 0733 08/03/18 1912  HGB  --    < > 7.7*  7.5*  --  7.4* 7.1* 7.7*  HCT  --    < > 25.6*  25.7*  --  24.5* 23.0* 25.2*  PLT  --    < > 411*  396  --  356 384 418*  LABPROT  --    < > 70.0*  --  22.6* 19.6*  --   INR  --    < > 8.7*  --  2.0* 1.7*  --   HEPARINUNFRC  --   --   --   --   --   --  <0.10*  CREATININE 7.95*  --   --  8.88*  --  6.91*  6.83*  --    < > = values in this interval not displayed.    Estimated Creatinine Clearance: 17.7 mL/min (A) (by C-G formula based on SCr of 6.83 mg/dL (H)).   Medical History: Past Medical History:  Diagnosis Date  . Aneurysm ([place])     Right arm fistula 3 aneurysms 2011,   plans to have a new procedure  . Angina   . Chest discomfort   . CHF (congestive heart failure) ([place])   . Coronary artery disease   . Coronary artery disease involving native coronary artery of native heart without angina pectoris   . Dialysis patient ([place])   . Dysrhythmia   . ESRD (end stage renal disease) ([place])    on hemodialysis T_T_S  . Hypertension   . Leg pain   . Mitral regurgitation   . Morbid obesity ([place])   . Orthostatic hypotension   . Overweight(278.02)   . Renal insufficiency   . 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 Severe aortic insufficiency   . Sinus tachycardia   . Syncope     positional after  dialysis... 2008    Medications:  Medications Prior to Admission  Medication Sig Dispense Refill Last Dose  . amiodarone (PACERONE) 200 MG tablet Take 1 tablet (200 mg total) by mouth daily. 90 tablet 3 07/31/2018 at am  . aspirin EC 81 MG EC tablet Take 1 tablet (81 mg total) by mouth daily.   07/31/2018 at Unknown time  . atorvastatin (LIPITOR) 80 MG tablet Take 1 tablet (80 mg total) by mouth daily at 6 PM. 90 tablet 3 07/31/2018 at Unknown time  . B Complex-C-Folic Acid (NEPHRO-VITE PO) Take 1 tablet by mouth daily.    07/31/2018 at Unknown time  . ciprofloxacin (CIPRO) 500 MG tablet Take 1 tablet (500 mg total) by mouth daily. Take after dialysis 10 tablet 0 07/31/2018 at Unknown time  . FOSRENOL 500 MG chewable tablet Chew 1,500-2,000 mg by mouth See admin instructions. Take 2000 mg by mouth with meals and take 1500 mg by mouth with snacks  07/31/2018 at Unknown time  . metoprolol tartrate (LOPRESSOR) 25 MG tablet TAKE 1 TABLET BY MOUTH TWICE DAILY (Patient taking differently: Take 25 mg by mouth 2 (two) times daily. (BETA BLOCKER)) 60 tablet 6 07/31/2018 at 1400  . metroNIDAZOLE (FLAGYL) 500 MG tablet Take 1 tablet (500 mg total) by mouth 3 (three) times daily. 30 tablet 0 07/31/2018 at Unknown time  . midodrine (PROAMATINE) 5 MG tablet Take 3 tablets (15 mg total) by mouth 3 (three) times daily with meals. 810 tablet 3 07/31/2018 at Unknown time  . warfarin (COUMADIN) 5 MG tablet Take 1 tablet daily except 1/2 tablet on Tuesdays, Thursdays and Saturdays or as directed by coumadin clinic (Patient taking differently: Take 5 mg by mouth daily. ) 90 tablet 1 07/31/2018 at 1800  . acetaminophen-codeine (TYLENOL #3) 300-30 MG tablet Take 1-2 tablets by mouth every 6 (six) hours as needed for moderate pain. (Patient not taking: Reported on 08/01/2018) 20 tablet 0 Not Taking at Unknown time  . HYDROcodone-acetaminophen (NORCO/VICODIN) 5-325 MG tablet Take 1-2 tablets by mouth every 4 (four) hours as needed.  (Patient not taking: Reported on 08/01/2018) 6 tablet 0 Not Taking at Unknown time  . oxyCODONE (OXY IR/ROXICODONE) 5 MG immediate release tablet Take 1 tablet (5 mg total) by mouth every 4 (four) hours as needed for severe pain. (Patient not taking: Reported on 08/01/2018) 30 tablet 0 Not Taking at Unknown time   Scheduled:  . sodium chloride   Intravenous Once  . sodium chloride   Intravenous Once  . amiodarone  200 mg Oral Daily  . atorvastatin  80 mg Oral q1800  . Chlorhexidine Gluconate Cloth  6 each Topical Q0600  . Chlorhexidine Gluconate Cloth  6 each Topical Q0600  . doxercalciferol  1.5 mcg Intravenous Q T,Th,Sa-HD  . lanthanum  2,000 mg Oral TID WC  . metoprolol tartrate  25 mg Oral BID  . midodrine  15 mg Oral TID WC  . multivitamin  1 tablet Oral Daily  . mupirocin ointment  1 application Nasal BID  . sodium chloride flush  3 mL Intravenous Q12H    Assessment: On Coumadin 5mg  daily exc 2.5mg  on Sun/Wed PTA for mechanical AVR. Goal INR is 2-3 per anticoag clinic note. Now with hematuria. Noted w/ large left perinephric hematoma (likely ruptured a hemorraghic cyst) Possible possible angiography/embolization and may need to start heparin. Vitamin K given for elevated INR. Now down to 2.0. Bridging with heparin gtt. Will shoot for lower end of goal. Hgb low at 7.1, plts wnl.  Initial heparin level undetectable, hgb now improved to 7.7 this evening. No bleeding or IV issues noted.   Goal of Therapy:  Heparin level 0.3-0.5 units/mL INR 2-3 Monitor platelets by anticoagulation protocol: Yes   Plan:  Increase heparin gtt to 1,700 units/hr Hold Coumadin tonight Monitor daily heparin level and INR, CBC, s/s of bleed   Erin Hearing PharmD., BCPS Clinical Pharmacist 08/03/2018 8:46 PM

## 2018-08-03 NOTE — Progress Notes (Signed)
Patient ID: [name] [name], male   DOB: [birth year], 53 y.o.   MRN: [id number]    Subjective: Pt with decreased pain in left flank and abdomen.  Objective: Vital signs in last 24 hours: Temp:  [98 F (36.7 C)-98.5 F (36.9 C)] 98 F (36.7 C) (04/17 0415) Pulse Rate:  [101-117] 101 (04/17 0902) Resp:  [16-20] 20 (04/17 0902) BP: (84-112)/(38-62) 84/42 (04/17 0902) SpO2:  [97 %-100 %] 99 % (04/17 0902) Weight:  [131.9 kg-133.4 kg] 133.1 kg (04/16 2129)  Intake/Output from previous day: 04/16 0701 - 04/17 0700 In: 522.3 [P.O.:260; Blood:262.3] Out: 1667 [Urine:1] Intake/Output this shift: No intake/output data recorded.  Physical Exam:  General: Alert and oriented Abd: Moderate tenderness in left CVA and left abdomen  Lab Results: Recent Labs    08/02/18 0619 08/02/18 1934 08/03/18 0733  HGB 7.7*  7.5* 7.4* 7.1*  HCT 25.6*  25.7* 24.5* 23.0*   BMET Recent Labs    08/02/18 1241 08/03/18 0733  NA 134* 135  K 4.3 4.1  CL 94* 95*  CO2 25 28  GLUCOSE 118* 101*  BUN 42* 34*  CREATININE 8.88* 6.83*  CALCIUM 7.6* 8.1*   INR 1.7    Assessment/Plan: 1) Left perinephric hematoma with polycystic kidneys: Hgb relatively stable and INR now no longer supratherapeutic.  Ok to resume ambulation.  Continue serial Hgb measurements.  Would consider IV heparin for now to ensure no need for intervention over next 24 hrs.  If he remains stable without the need for transfusion over the next 24 hrs, would be reasonable to begin gradually restarting oral anticoagulation carefully to get therapeutic.  Will ultimately need repeat imaging of the left kidney in 3-4 months and will arrange as an outpatient.  Will continue to follow.   LOS: 2 days   Dutch Gray 08/03/2018, 9:06 AM

## 2018-08-03 NOTE — Progress Notes (Addendum)
   Subjective: No overnight events. Patient reports feeling well this morning. States he stopped HD one hour early due to abdominal pain. Denies any other acute symptoms. Discussed bridging to warfarin with heparin. All questions and concerns addressed.   Objective:  Vital signs in last 24 hours: Vitals:   08/02/18 1531 08/02/18 1732 08/02/18 2129 08/03/18 0415  BP: (!) 96/48 100/60 (!) 103/47 (!) 90/50  Pulse: (!) 117 (!) 112 (!) 112 (!) 101  Resp: 16 19 20 20   Temp: 98.1 F (36.7 C) 98.2 F (36.8 C) 98.3 F (36.8 C) 98 F (36.7 C)  TempSrc: Oral Oral Oral Oral  SpO2: 100%  97% 100%  Weight: 131.9 kg  133.1 kg   Height:       Physical Exam Gen: comfortably resting in bed, no distress  CV: RRR, mechanical click  Pulm: CTA bilaterally, normal effort Ext: no edema, warm and well perfused    Assessment/Plan:  Principal Problem:   Renal hematoma, left, initial encounter Active Problems:   ESRD (end stage renal disease) ([facility])   Morbid obesity ([facility])   S/P aortic valve replacement with bileaflet mechanical valve   Anemia associated with acute blood loss  [name] is a 53 year old man with CAD s/p CABG, CHF, ESRD on HD, on Coumadin secondary to aortic valve replacement presenting with leftsided abdominal andflank pain.CT consistent with left sided perinephric hematoma.  Perinephric hematoma:in the setting of polycystic kidney disease, likely ruptured a hemorrhagic cyst with connection to collecting system. Hemoglobin 7.4 yesterday after HD and 1 unit PRBC transfused. Morning CBC shows a Hgb of 7.1.  If Hgb continues to drop he may need another transfusion and IR consult for angiography/embolization. - Urology on board, appreciate recommendations - Trend CBC - Cardiac monitoring -Pain control with hydrocodone-acetaminophen 1-2 tablets q6h prn - Zofran for nausea  Aortic valve replacement on coumadin Supra therapeutic AVW:[id number] to 1.7 this morning. Will  start IV heparin today - Heparin per pharmacy  - Daily PT/INR  ESRD on HD: 2/2hypertension. Also history ofpolycystic kidney disease.Right arm fistula. Last HD 4/16. - Nephrology consulted for HD, appreciate assistance   CAD s/p CABG  Chronic systolic heart failure: Most recent echo 10/2017 EF 30%.  - C/w home medications include amiodarone, metoprolol, aspirin and midodrine - Holding aspirin  Dispo: Anticipated discharge pending clinical improvement.  Mike Craze, DO 08/03/2018, 6:56 AM Pager: [phone]

## 2018-08-03 NOTE — Progress Notes (Signed)
ANTICOAGULATION CONSULT NOTE - Initial Consult  Pharmacy Consult for warfarin and heparin Indication: mechanical AVR  No Known Allergies  Patient Measurements: Height: 6' (182.9 cm) Weight: 293 lb 6.9 oz (133.1 kg) IBW/kg (Calculated) : 77.6  Heparin dosing weight: 108 kg   Vital Signs: Temp: 98 F (36.7 C) (04/17 0415) Temp Source: Oral (04/17 0415) BP: 90/50 (04/17 0415) Pulse Rate: 101 (04/17 0415)  Labs: Recent Labs    08/01/18 1312 08/01/18 1354  08/01/18 1917 08/02/18 0619 08/02/18 1241 08/02/18 1934 08/03/18 0733  HGB  --   --   --  7.7* 7.7*  7.5*  --  7.4*  --   HCT  --   --   --  25.2* 25.6*  25.7*  --  24.5*  --   PLT  --   --   --  297 411*  396  --  356  --   LABPROT  --   --    < >  --  70.0*  --  22.6* 19.6*  INR  --   --    < >  --  8.7*  --  2.0* 1.7*  CREATININE 8.03* 7.95*  --   --   --  8.88*  --   --    < > = values in this interval not displayed.    Estimated Creatinine Clearance: 13.6 mL/min (A) (by C-G formula based on SCr of 8.88 mg/dL (H)).   Medical History: Past Medical History:  Diagnosis Date  . Aneurysm ([place])     Right arm fistula 3 aneurysms 2011,   plans to have a new procedure  . Angina   . Chest discomfort   . CHF (congestive heart failure) ([place])   . Coronary artery disease   . Coronary artery disease involving native coronary artery of native heart without angina pectoris   . Dialysis patient ([place])   . Dysrhythmia   . ESRD (end stage renal disease) ([place])    on hemodialysis T_T_S  . Hypertension   . Leg pain   . Mitral regurgitation   . Morbid obesity ([place])   . Orthostatic hypotension   . Overweight(278.02)   . Renal insufficiency   . 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 Severe aortic insufficiency   . Sinus tachycardia   . Syncope     positional after dialysis... 2008     Medications:  Medications Prior to Admission  Medication Sig Dispense Refill Last Dose  . amiodarone (PACERONE) 200 MG tablet Take 1 tablet (200 mg total) by mouth daily. 90 tablet 3 07/31/2018 at am  . aspirin EC 81 MG EC tablet Take 1 tablet (81 mg total) by mouth daily.   07/31/2018 at Unknown time  . atorvastatin (LIPITOR) 80 MG tablet Take 1 tablet (80 mg total) by mouth daily at 6 PM. 90 tablet 3 07/31/2018 at Unknown time  . B Complex-C-Folic Acid (NEPHRO-VITE PO) Take 1 tablet by mouth daily.    07/31/2018 at Unknown time  . ciprofloxacin (CIPRO) 500 MG tablet Take 1 tablet (500 mg total) by mouth daily. Take after dialysis 10 tablet 0 07/31/2018 at Unknown time  . FOSRENOL 500 MG chewable tablet Chew 1,500-2,000 mg by mouth See admin instructions. Take 2000 mg by mouth with meals and take 1500 mg by mouth with snacks  07/31/2018 at Unknown time  . metoprolol tartrate (LOPRESSOR) 25 MG tablet TAKE 1 TABLET BY MOUTH TWICE DAILY (Patient taking differently: Take 25 mg by mouth 2 (two) times daily. (BETA BLOCKER)) 60 tablet 6 07/31/2018 at 1400  . metroNIDAZOLE (FLAGYL) 500 MG tablet Take 1 tablet (500 mg total) by mouth 3 (three) times daily. 30 tablet 0 07/31/2018 at Unknown time  . midodrine (PROAMATINE) 5 MG tablet Take 3 tablets (15 mg total) by mouth 3 (three) times daily with meals. 810 tablet 3 07/31/2018 at Unknown time  . warfarin (COUMADIN) 5 MG tablet Take 1 tablet daily except 1/2 tablet on Tuesdays, Thursdays and Saturdays or as directed by coumadin clinic (Patient taking differently: Take 5 mg by mouth daily. ) 90 tablet 1 07/31/2018 at 1800  . acetaminophen-codeine (TYLENOL #3) 300-30 MG tablet Take 1-2 tablets by mouth every 6 (six) hours as needed for moderate pain. (Patient not taking: Reported on 08/01/2018) 20 tablet 0 Not Taking at Unknown time  . HYDROcodone-acetaminophen (NORCO/VICODIN) 5-325 MG tablet Take 1-2 tablets by mouth every 4 (four) hours as needed. (Patient not  taking: Reported on 08/01/2018) 6 tablet 0 Not Taking at Unknown time  . oxyCODONE (OXY IR/ROXICODONE) 5 MG immediate release tablet Take 1 tablet (5 mg total) by mouth every 4 (four) hours as needed for severe pain. (Patient not taking: Reported on 08/01/2018) 30 tablet 0 Not Taking at Unknown time   Scheduled:  . sodium chloride   Intravenous Once  . amiodarone  200 mg Oral Daily  . atorvastatin  80 mg Oral q1800  . Chlorhexidine Gluconate Cloth  6 each Topical Q0600  . Chlorhexidine Gluconate Cloth  6 each Topical Q0600  . doxercalciferol  1.5 mcg Intravenous Q T,Th,Sa-HD  . lanthanum  2,000 mg Oral TID WC  . metoprolol tartrate  25 mg Oral BID  . midodrine  15 mg Oral TID WC  . multivitamin  1 tablet Oral Daily  . mupirocin ointment  1 application Nasal BID  . sodium chloride flush  3 mL Intravenous Q12H    Assessment: On Coumadin 5mg  daily exc 2.5mg  on Sun/Wed PTA for mechanical AVR. Goal INR is 2-3 per anticoag clinic note. Now with hematuria. Noted w/ large left perinephric hematoma (likely ruptured a hemorraghic cyst) Possible possible angiography/embolization and may need to start heparin. Vitamin K given for elevated INR. Now down to 2.0. Bridging with heparin gtt. Will shoot for lower end of goal. Hgb low at 7.1, plts wnl.  Goal of Therapy:  Heparin level 0.3-0.5 units/mL INR 2-3 Monitor platelets by anticoagulation protocol: Yes   Plan:  No heparin bolus Start heparin gtt at 1,400 units/hr Hold Coumadin tonight Monitor daily heparin level and INR, CBC, s/s of bleed  Elenor Quinones, PharmD, BCPS, BCIDP Clinical Pharmacist 08/03/2018 8:44 AM

## 2018-08-03 NOTE — Progress Notes (Signed)
Patient ID: [name] [name], male   DOB: 1965/07/16, 53 y.o.   MRN: [id number]  [facility] Progress Note   Assessment/ Plan:   1.  Left perinephric/intracystic hematoma: Ongoing pain management and concomitant treatment of acute blood loss anemia.    Appreciate input from urology, outpatient follow-up plans noted; currently no indication for antiembolic therapy. 2.  End-stage renal disease: Doubt acute indication for dialysis today, will order for dialysis tomorrow (heparin free). 3.  Anemia: With component of acute blood loss arising from perinephric/intracystic bleed.    1 unit packed red cell transfusion to be given today. 4.  Secondary hyperparathyroidism:  Mild hyperphosphatemia noted on labs, resumed Hectorol for PTH suppression. 5.  Hypertension: Blood pressures on the lower side, antihypertensive therapy on hold and plans noted for PRBC transfusion.  Subjective:   Reports to be feeling better today with improving flank pain.  Hypotensive earlier and PRBCs ordered.   Objective:   BP (!) 84/42 (BP Location: Right Arm)   Pulse (!) 101   Temp 98 F (36.7 C) (Oral)   Resp 20   Ht 6' (1.829 m)   Wt 133.1 kg   SpO2 99%   BMI 39.80 kg/m   Physical Exam: Gen: Comfortably resting flat in bed CVS: Pulse regular tachycardia, ejection systolic murmur over apex Resp: Anteriorly clear to auscultation, no rales Abd: Soft, obese, left upper quadrant tenderness Ext: No lower extremity edema, left forearm aVF  Labs: BMET Recent Labs  Lab 08/01/18 1312 08/01/18 1354 08/02/18 1241 08/03/18 0733  NA 137 137 134* 135  K 3.4* 3.6 4.3 4.1  CL 97* 97* 94* 95*  CO2 27 28 25 28   GLUCOSE 110* 107* 118* 101*  BUN 33* 33* 42* 34*  CREATININE 8.03* 7.95* 8.88* 6.83*  CALCIUM 7.9* 7.8* 7.6* 8.1*  PHOS  --   --  5.6*  --    CBC Recent Labs  Lab 08/01/18 1213 08/01/18 1917 08/02/18 0619 08/02/18 1934 08/03/18 0733  WBC 15.0* 14.2* 20.3*  20.6* 23.2* 19.6*  NEUTROABS  13.2*  --  17.7*  --   --   HGB 8.9* 7.7* 7.7*  7.5* 7.4* 7.1*  HCT 29.5* 25.2* 25.6*  25.7* 24.5* 23.0*  MCV 84.0 83.7 85.6  86.0 82.5 83.6  PLT 356 297 411*  396 356 384   Medications:    . sodium chloride   Intravenous Once  . sodium chloride   Intravenous Once  . amiodarone  200 mg Oral Daily  . atorvastatin  80 mg Oral q1800  . Chlorhexidine Gluconate Cloth  6 each Topical Q0600  . Chlorhexidine Gluconate Cloth  6 each Topical Q0600  . doxercalciferol  1.5 mcg Intravenous Q T,Th,Sa-HD  . lanthanum  2,000 mg Oral TID WC  . metoprolol tartrate  25 mg Oral BID  . midodrine  15 mg Oral TID WC  . multivitamin  1 tablet Oral Daily  . mupirocin ointment  1 application Nasal BID  . sodium chloride flush  3 mL Intravenous Q12H   Elmarie Shiley, MD 08/03/2018, 10:41 AM

## 2018-08-03 NOTE — Progress Notes (Signed)
  Date: 08/03/2018  Patient name: [name] [name]  Medical record number: [id number]  Date of birth: [birth year]   I have seen and evaluated this patient and I have discussed the plan of care with the house staff. Please see their note for complete details. I concur with their findings with the following additions/corrections:   Was able to go to HD yesterday and received a transfusion of 1 unit of PRBCs.  Had a sign off about an hour early due to severe abdominal pain.  Unfortunately, hemoglobin is down again this morning to 7.1 despite yesterday's transfusion.  INR has corrected to 1.7 this morning.  Challenging situation, as his mechanical aortic valve requires anticoagulation but he appears to have at least some degree of ongoing bleeding into his perinephric hematoma.  We will start heparin drip but hold off on restarting warfarin for now until hemoglobin stabilizes.  Plan to transfuse again today.  Lenice Pressman, M.D., Ph.D. 08/03/2018, 12:02 PM

## 2018-08-04 ENCOUNTER — Inpatient Hospital Stay (HOSPITAL_COMMUNITY): Payer: Medicare Other

## 2018-08-04 LAB — CBC
HCT: 23.9 % — ABNORMAL LOW (ref 39.0–52.0)
HCT: 23.6 % — ABNORMAL LOW (ref 39.0–52.0)
Hemoglobin: 7.3 g/dL — ABNORMAL LOW (ref 13.0–17.0)
Hemoglobin: 7.4 g/dL — ABNORMAL LOW (ref 13.0–17.0)
MCH: 25.6 pg — ABNORMAL LOW (ref 26.0–34.0)
MCH: 26.3 pg (ref 26.0–34.0)
MCHC: 30.5 g/dL (ref 30.0–36.0)
MCHC: 31.4 g/dL (ref 30.0–36.0)
MCV: 83.9 fL (ref 80.0–100.0)
MCV: 84 fL (ref 80.0–100.0)
Platelets: 379 10*3/uL (ref 150–400)
Platelets: 416 10*3/uL — ABNORMAL HIGH (ref 150–400)
RBC: 2.85 MIL/uL — ABNORMAL LOW (ref 4.22–5.81)
RBC: 2.81 MIL/uL — ABNORMAL LOW (ref 4.22–5.81)
RDW: 16.3 % — ABNORMAL HIGH (ref 11.5–15.5)
RDW: 16.4 % — ABNORMAL HIGH (ref 11.5–15.5)
WBC: 17.8 10*3/uL — ABNORMAL HIGH (ref 4.0–10.5)
WBC: 18.2 10*3/uL — ABNORMAL HIGH (ref 4.0–10.5)
nRBC: 0 % (ref 0.0–0.2)
nRBC: 0 % (ref 0.0–0.2)

## 2018-08-04 LAB — RENAL FUNCTION PANEL
Albumin: 2.2 g/dL — ABNORMAL LOW (ref 3.5–5.0)
Anion gap: 12 (ref 5–15)
BUN: 14 mg/dL (ref 6–20)
CO2: 28 mmol/L (ref 22–32)
Calcium: 9 mg/dL (ref 8.9–10.3)
Chloride: 95 mmol/L — ABNORMAL LOW (ref 98–111)
Creatinine, Ser: 4.28 mg/dL — ABNORMAL HIGH (ref 0.61–1.24)
GFR calc Af Amer: 17 mL/min — ABNORMAL LOW (ref >60)
GFR calc non Af Amer: 15 mL/min — ABNORMAL LOW (ref >60)
Glucose, Bld: 85 mg/dL (ref 70–99)
Phosphorus: 3.7 mg/dL (ref 2.5–4.6)
Potassium: 3.4 mmol/L — ABNORMAL LOW (ref 3.5–5.1)
Sodium: 135 mmol/L (ref 135–145)

## 2018-08-04 LAB — PREPARE RBC (CROSSMATCH)

## 2018-08-04 LAB — PROTIME-INR
INR: 1.5 — ABNORMAL HIGH (ref 0.8–1.2)
Prothrombin Time: 17.7 seconds — ABNORMAL HIGH (ref 11.4–15.2)

## 2018-08-04 LAB — HEPARIN LEVEL (UNFRACTIONATED)
Heparin Unfractionated: 0.12 IU/mL — ABNORMAL LOW (ref 0.30–0.70)
Heparin Unfractionated: 0.27 IU/mL — ABNORMAL LOW (ref 0.30–0.70)
Heparin Unfractionated: 0.2 IU/mL — ABNORMAL LOW (ref 0.30–0.70)

## 2018-08-04 MED ORDER — SODIUM CHLORIDE 0.9% IV SOLUTION: Freq: Once | INTRAVENOUS | Status: DC

## 2018-08-04 MED ORDER — DOXERCALCIFEROL 4 MCG/2ML IV SOLN
INTRAVENOUS | Status: AC
  Administered 2018-08-04: 1.5 ug via INTRAVENOUS
  Filled 2018-08-04: qty 2

## 2018-08-04 MED ORDER — SODIUM CHLORIDE 0.9 % IV BOLUS
500.0000 mL | Freq: Once | INTRAVENOUS | Status: AC
  Administered 2018-08-04: 500 mL via INTRAVENOUS

## 2018-08-04 MED ORDER — WARFARIN SODIUM 5 MG PO TABS
5.0000 mg | ORAL_TABLET | Freq: Once | ORAL | Status: AC
  Administered 2018-08-04: 5 mg via ORAL
  Filled 2018-08-04: qty 1

## 2018-08-04 MED ORDER — IOHEXOL 300 MG/ML  SOLN
100.0000 mL | Freq: Once | INTRAMUSCULAR | Status: AC | PRN
  Administered 2018-08-04: 100 mL via INTRAVENOUS

## 2018-08-04 MED ORDER — WARFARIN - PHARMACIST DOSING INPATIENT
Freq: Every day | Status: DC
  Administered 2018-08-05: 18:00:00

## 2018-08-04 NOTE — Progress Notes (Signed)
  Subjective: Patient reports he is doing well.  Hopes to go home today.  He has passed no clots or gross hematuria.  Objective: Vital signs in last 24 hours: Temp:  [97.8 F (36.6 C)-98.9 F (37.2 C)] 98.1 F (36.7 C) (04/18 1240) Pulse Rate:  [84-111] 110 (04/18 1240) Resp:  [18-24] 24 (04/18 1136) BP: (80-111)/(22-58) 98/54 (04/18 1240) SpO2:  [95 %-99 %] 95 % (04/18 1240) Weight:  [134.6 kg-135.1 kg] 134.6 kg (04/18 0655)  Intake/Output from previous day: 04/17 0701 - 04/18 0700 In: 1366.7 [P.O.:620; I.V.:300.7; Blood:446] Out: 0  Intake/Output this shift: Total I/O In: -  Out: 9357 [Other:1379]  Physical Exam:  No acute distress, watching racing on TV Abdomen soft and nontender Extremities without swelling or pain  Lab Results: Recent Labs    08/03/18 0733 08/03/18 1912 08/04/18 0402  HGB 7.1* 7.7* 7.3*  HCT 23.0* 25.2* 23.9*   BMET Recent Labs    08/02/18 1241 08/03/18 0733  NA 134* 136  135  K 4.3 4.1  4.1  CL 94* 95*  95*  CO2 25 28  28   GLUCOSE 118* 95  101*  BUN 42* 33*  34*  CREATININE 8.88* 6.91*  6.83*  CALCIUM 7.6* 8.1*  8.1*   Recent Labs    08/02/18 1934 08/03/18 0733 08/04/18 0402  INR 2.0* 1.7* 1.5*   No results for input(s): LABURIN in the last 72 hours. Results for orders placed or performed during the hospital encounter of 08/01/18  MRSA PCR Screening     Status: Abnormal   Collection Time: 08/01/18  6:08 PM  Result Value Ref Range Status   MRSA by PCR POSITIVE (A) NEGATIVE Final    Comment:        The GeneXpert MRSA Assay (FDA approved for NASAL specimens only), is one component of a comprehensive MRSA colonization surveillance program. It is not intended to diagnose MRSA infection nor to guide or monitor treatment for MRSA infections. RESULT CALLED TO, READ BACK BY AND VERIFIED WITH: Vicenta Aly RN 08/01/18 1958 JDW Performed at [facility], [place] [address] [place], [place] [address]      Studies/Results: No results found. I reviewed CT scan images.   Assessment/Plan: 1) Left perinephric hematoma with polycystic kidneys: Hgb relatively stable and INR now no longer supratherapeutic.  Ok to resume ambulation.  Continue serial Hgb measurements. He has remained stable without transfusion over the past 24 hrs, would be reasonable to restart oral anticoagulation gradually get therapeutic.  Will ultimately need repeat imaging of the left kidney in 3-4 months and will arrange as an outpatient.  Will continue to follow. He is stable from a GU pt of view.    LOS: 3 days   Festus Aloe 08/04/2018, 1:25 PM

## 2018-08-04 NOTE — Progress Notes (Signed)
Patients BP soft at 85/45, rechecked : 88/41  Order for 500cc bolus ordered. RN advised to recheck BP upon completion. RN will implement.  RN will continue to monitor.

## 2018-08-04 NOTE — Progress Notes (Signed)
ANTICOAGULATION CONSULT NOTE - Follow-Up Consult  Pharmacy Consult for warfarin and heparin Indication: mechanical AVR  No Known Allergies  Patient Measurements: Height: 6' (182.9 cm) Weight: 293 lb 14 oz (133.3 kg) IBW/kg (Calculated) : 77.6  Heparin dosing weight: 108 kg   Vital Signs: Temp: 98.2 F (36.8 C) (04/18 1600) Temp Source: Oral (04/18 1600) BP: 98/51 (04/18 1600) Pulse Rate: 108 (04/18 1600)  Labs: Recent Labs    08/02/18 1241 08/02/18 1934 08/03/18 0733 08/03/18 1912 08/04/18 0402 08/04/18 1345  HGB  --  7.4* 7.1* 7.7* 7.3* 7.4*  HCT  --  24.5* 23.0* 25.2* 23.9* 23.6*  PLT  --  356 384 418* 379 416*  LABPROT  --  22.6* 19.6*  --  17.7*  --   INR  --  2.0* 1.7*  --  1.5*  --   HEPARINUNFRC  --   --   --  <0.10* 0.12* 0.27*  CREATININE 8.88*  --  6.91*  6.83*  --   --  4.28*    Estimated Creatinine Clearance: 28.2 mL/min (A) (by C-G formula based on SCr of 4.28 mg/dL (H)).   Medical History: Past Medical History:  Diagnosis Date  . Aneurysm ([place])     Right arm fistula 3 aneurysms 2011,   plans to have a new procedure  . Angina   . Chest discomfort   . CHF (congestive heart failure) ([place])   . Coronary artery disease   . Coronary artery disease involving native coronary artery of native heart without angina pectoris   . Dialysis patient ([place])   . Dysrhythmia   . ESRD (end stage renal disease) ([place])    on hemodialysis T_T_S  . Hypertension   . Leg pain   . Mitral regurgitation   . Morbid obesity ([place])   . Orthostatic hypotension   . Overweight(278.02)   . Renal insufficiency   . 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 Severe aortic insufficiency   . Sinus tachycardia   . Syncope     positional after dialysis... 2008    Medications:  Medications Prior to Admission  Medication Sig Dispense Refill Last  Dose  . amiodarone (PACERONE) 200 MG tablet Take 1 tablet (200 mg total) by mouth daily. 90 tablet 3 07/31/2018 at am  . aspirin EC 81 MG EC tablet Take 1 tablet (81 mg total) by mouth daily.   07/31/2018 at Unknown time  . atorvastatin (LIPITOR) 80 MG tablet Take 1 tablet (80 mg total) by mouth daily at 6 PM. 90 tablet 3 07/31/2018 at Unknown time  . B Complex-C-Folic Acid (NEPHRO-VITE PO) Take 1 tablet by mouth daily.    07/31/2018 at Unknown time  . ciprofloxacin (CIPRO) 500 MG tablet Take 1 tablet (500 mg total) by mouth daily. Take after dialysis 10 tablet 0 07/31/2018 at Unknown time  . FOSRENOL 500 MG chewable tablet Chew 1,500-2,000 mg by mouth See admin instructions. Take 2000 mg by mouth with meals and take 1500 mg by mouth with snacks   07/31/2018 at Unknown time  . metoprolol tartrate (LOPRESSOR) 25 MG tablet TAKE 1 TABLET BY MOUTH TWICE DAILY (Patient taking differently: Take 25 mg by mouth 2 (two) times daily. (BETA BLOCKER)) 60 tablet 6 07/31/2018 at 1400  . metroNIDAZOLE (FLAGYL) 500 MG tablet Take 1 tablet (500 mg total) by mouth  3 (three) times daily. 30 tablet 0 07/31/2018 at Unknown time  . midodrine (PROAMATINE) 5 MG tablet Take 3 tablets (15 mg total) by mouth 3 (three) times daily with meals. 810 tablet 3 07/31/2018 at Unknown time  . warfarin (COUMADIN) 5 MG tablet Take 1 tablet daily except 1/2 tablet on Tuesdays, Thursdays and Saturdays or as directed by coumadin clinic (Patient taking differently: Take 5 mg by mouth daily. ) 90 tablet 1 07/31/2018 at 1800  . acetaminophen-codeine (TYLENOL #3) 300-30 MG tablet Take 1-2 tablets by mouth every 6 (six) hours as needed for moderate pain. (Patient not taking: Reported on 08/01/2018) 20 tablet 0 Not Taking at Unknown time  . HYDROcodone-acetaminophen (NORCO/VICODIN) 5-325 MG tablet Take 1-2 tablets by mouth every 4 (four) hours as needed. (Patient not taking: Reported on 08/01/2018) 6 tablet 0 Not Taking at Unknown time  . oxyCODONE (OXY  IR/ROXICODONE) 5 MG immediate release tablet Take 1 tablet (5 mg total) by mouth every 4 (four) hours as needed for severe pain. (Patient not taking: Reported on 08/01/2018) 30 tablet 0 Not Taking at Unknown time   Scheduled:  . sodium chloride   Intravenous Once  . sodium chloride   Intravenous Once  . amiodarone  200 mg Oral Daily  . atorvastatin  80 mg Oral q1800  . Chlorhexidine Gluconate Cloth  6 each Topical Q0600  . Chlorhexidine Gluconate Cloth  6 each Topical Q0600  . doxercalciferol  1.5 mcg Intravenous Q T,Th,Sa-HD  . lanthanum  2,000 mg Oral TID WC  . metoprolol tartrate  25 mg Oral BID  . midodrine  15 mg Oral TID WC  . multivitamin  1 tablet Oral Daily  . mupirocin ointment  1 application Nasal BID  . sodium chloride flush  3 mL Intravenous Q12H    Assessment: On Coumadin 5mg  daily exc 2.5mg  on Sun/Wed PTA for mechanical AVR. Goal INR is 2-3 per anticoag clinic note. Now with hematuria. Noted w/ large left perinephric hematoma (likely ruptured a hemorraghic cyst) .  INR elevated on admit now down to 1.5 and on heparin bridge after vitamin K x2. Pharmacy asked to resume warfarin given stable H/H today.  Goal of Therapy:  Heparin level 0.3-0.5 units/mL INR 2-3 Monitor platelets by anticoagulation protocol: Yes   Plan:  -Warfarin 5mg  PO x1 tonight -Daily protime  Arrie Senate, PharmD, BCPS Clinical Pharmacist [phone] Please check AMION for all [facility] numbers 08/04/2018

## 2018-08-04 NOTE — Progress Notes (Signed)
  Date: 08/04/2018  Patient name: [name] [name]  Medical record number: [id number]  Date of birth: January 29, 1966   I have seen and evaluated this patient and I have discussed the plan of care with the house staff. Please see their note for complete details. I concur with their findings with the following additions/corrections:   Reports feeling better today, abdominal pain is decreased and he was able to tolerate HD better today.  Unfortunately, Hgb continues to drop despite transfusion, was 7.1 yesterday morning, 7.7 posttransfusion, 7.3 this morning.  Stabilized at 7.4 after HD today.  Given that he has failed to respond to the 2 transfusions, we will reimage today with CT to evaluate for progression of his hematoma or additional sources of blood loss.  He continues on a heparin drip for anticoagulation for his mechanical aortic valve.  I am hesitant to restart warfarin until his bleeding has stabilized more.  Lenice Pressman, M.D., Ph.D. 08/04/2018, 2:43 PM

## 2018-08-04 NOTE — Procedures (Signed)
Patient seen on Hemodialysis. BP (!) 100/45   Pulse (!) 104   Temp 98.8 F (37.1 C) (Oral)   Resp 20   Ht 6' (1.829 m)   Wt 134.6 kg   SpO2 99%   BMI 40.25 kg/m   QB 400, UF goal 1.5L Tolerating treatment without complaints at this time.  Plans for possible DC home later today  Elmarie Shiley MD [facility]. Office # [phone] Pager # [phone] 10:50 AM

## 2018-08-04 NOTE — Discharge Summary (Signed)
Name: [name] [name] MRN: [id number] DOB: [birth year] 53 y.o. PCP: Patient, No Pcp Per  Date of Admission: 08/01/2018 11:43 AM Date of Discharge: 08/07/2018 Attending Physician: [name] [name]  Discharge Diagnosis: 1. Perinephric Hematoma 2. Anemia 2/2 acute blood loss  3. ESRD on HD  4. Prolonged QTc 5. Hx of mechanical AVR  Discharge Medications: Allergies as of 08/07/2018   No Known Allergies     Medication List    STOP taking these medications   acetaminophen-codeine 300-30 MG tablet Commonly known as:  TYLENOL #3   HYDROcodone-acetaminophen 5-325 MG tablet Commonly known as:  NORCO/VICODIN   metoprolol tartrate 25 MG tablet Commonly known as:  LOPRESSOR   metroNIDAZOLE 500 MG tablet Commonly known as:  FLAGYL   oxyCODONE 5 MG immediate release tablet Commonly known as:  Oxy IR/ROXICODONE     TAKE these medications   amiodarone 200 MG tablet Commonly known as:  PACERONE Take 1 tablet (200 mg total) by mouth daily.   aspirin 81 MG EC tablet Take 1 tablet (81 mg total) by mouth daily.   atorvastatin 80 MG tablet Commonly known as:  LIPITOR Take 1 tablet (80 mg total) by mouth daily at 6 PM.   ciprofloxacin 500 MG tablet Commonly known as:  Cipro Take 1 tablet (500 mg total) by mouth daily. Take after dialysis   Fosrenol 500 MG chewable tablet Generic drug:  lanthanum Chew 1,500-2,000 mg by mouth See admin instructions. Take 2000 mg by mouth with meals and take 1500 mg by mouth with snacks   midodrine 5 MG tablet Commonly known as:  PROAMATINE Take 3 tablets (15 mg total) by mouth 3 (three) times daily with meals.   NEPHRO-VITE PO Take 1 tablet by mouth daily.   warfarin 5 MG tablet Commonly known as:  COUMADIN Take as directed. If you are unsure how to take this medication, talk to your nurse or doctor. Original instructions:  Take 1 tablet daily except 1/2 tablet on Wednesdays and Sundays or as directed by coumadin clinic What changed:   additional instructions       Disposition and follow-up:   [name] [name] was discharged from [facility] in Good condition.  At the hospital follow up visit please address:  1.  Perinephric hematoma: Hgb had been stable and abdominal pain had improved. Please make sure that he has urology follow up. Restarted his xeralto, please check his INR, currently on 5 mg daily except for 2.5 mg on Wed and Sun.   ESRD on HD: Recheck labs. Make sure that he is going to dialysis and seeing his Nephrologist.   Prolonged QTC: Noted on EKG, had his amiodarone and hydrocodone held, repeat EKG showed improvement to 400.  Please consider repeating EKG.  2.  Labs / imaging needed at time of follow-up: INR, CBC, BMP, EKG  3.  Pending labs/ test needing follow-up: None  Follow-up Appointments: Follow-up Information    [name] Bring, MD. Call.   Specialty:  Urology Contact information: [place] [address] [phone]        [name] Commons, MD. Schedule an appointment as soon as possible for a visit in 1 week(s).   Specialty:  Cardiology Contact information: [place] [address] [phone]           Hospital Course by problem list:  1. Perinephric Hematoma- Patient presented with left-sided abdominal and flank pain.  CT abdomen was consistent with  left-sided perinephric hematoma likely in the setting of polycystic kidney disease with a ruptured hemorrhagic cyst with connection to the collecting system.  Urology was consulted and recommended serial CBCs and conservative management with pain control.  He was given 3 units of packed red blood cells during his hospital stay.  He also remained hypotensive and was given numerous fluid boluses to which he responded well.  Repeat CT abdomen showed a slight increase in hematoma.  He was restarted on Coumadin for his mechanical AVR, and was transitioned from heparin to Coumadin with a INR of  2.2 on discharge.  Hgb had remained stable at 7.9 and he was hemodynamically stable and symptomatically improved.  Patient was discharged with follow-up with urology to have repeat imaging in 3 to 4 months.  2. Anemia 2/2 acute blood loss -see above.  3. ESRD on HD -secondary to hypertension.  Also has a history of polycystic kidney disease.  He hemodialysis during his hospital admission last HD was on 4/20.  We will follow-up with nephrology to resume his regular scheduled dialysis.  4.  Prolonged QTC: This was noted on EKG, QTC was around 490 and his amiodarone and hydrocodone had been held.  Repeat EKG next day showed improvement in his QTC to around 400, amiodarone was restarted.  5. Mechanical AVR on coumadin: See 1. Coumadin was restarted at 2.5 mg daily and 5 mg on Wednesday and Sunday. INR on discharge was 2.2.   Discharge Vitals:   BP (!) 102/50 (BP Location: Right Arm)   Pulse (!) 118   Temp 98 F (36.7 [name]) (Oral)   Resp 20   Ht 6' (1.829 m)   Wt 134.6 kg   SpO2 98%   BMI 40.25 kg/m   Pertinent Labs, Studies, and Procedures:  CBC Latest Ref Rng & Units 08/07/2018 08/06/2018 08/05/2018  WBC 4.0 - 10.5 K/uL 9.9 9.1 14.4(H)  Hemoglobin 13.0 - 17.0 g/dL 7.9(L) 7.5(L) 7.6(L)  Hematocrit 39.0 - 52.0 % 26.4(L) 24.2(L) 24.4(L)  Platelets 150 - 400 K/uL 380 353 352   BMP Latest Ref Rng & Units 08/06/2018 08/05/2018 08/04/2018  Glucose 70 - 99 mg/dL 96 79 85  BUN 6 - 20 mg/dL 40(H) 28(H) 14  Creatinine 0.61 - 1.24 mg/dL 8.36(H) 6.85(H) 4.28(H)  BUN/Creat Ratio 9 - 20 - - -  Sodium 135 - 145 mmol/L 132(L) 133(L) 135  Potassium 3.5 - 5.1 mmol/L 4.0 4.0 3.4(L)  Chloride 98 - 111 mmol/L 95(L) 94(L) 95(L)  CO2 22 - 32 mmol/L 24 30 28   Calcium 8.9 - 10.3 mg/dL 8.1(L) 8.6(L) 9.0   08/01/18 CT abd: IMPRESSION: 1. Interval significant change in appearance of the left kidney compared with previous CT of 9 days ago. The left kidney is enlarged with heterogeneous high density consistent with  bleeding into one or more of the complex left renal cysts. In addition, there is a moderate size left perinephric hematoma. 2. Underlying changes of adult polycystic kidney disease bilaterally. 3. Dependent high density in the bladder again noted, likely blood clot. 4. Other incidental findings are stable, including sigmoid diverticulosis, a periumbilical hernia containing only fat and Aortic Atherosclerosis (ICD10-I70.0).  08/04/18 CT abd: IMPRESSION: 1. Similar appearance of the abnormally enlarged left kidney although it has increased slightly in size in the interval. As mentioned on CT from 3 days ago, imaging features are compatible with hemorrhage into 1 or more the cystic lesions. Associated moderate left perinephric hematoma is similar to prior. 2. Innumerable cysts of varying  size and attenuation in the right kidney, compatible with polycystic kidney disease. No definite right hydroureteronephrosis. 3. Anatomic pelvis was not imaged as part of this abdomen only CT.   Discharge Instructions: Discharge Instructions    Call MD for:  difficulty breathing, headache or visual disturbances   Complete by:  As directed    Call MD for:  extreme fatigue   Complete by:  As directed    Call MD for:  hives   Complete by:  As directed    Call MD for:  persistant dizziness or light-headedness   Complete by:  As directed    Call MD for:  persistant nausea and vomiting   Complete by:  As directed    Call MD for:  redness, tenderness, or signs of infection (pain, swelling, redness, odor or green/yellow discharge around incision site)   Complete by:  As directed    Call MD for:  severe uncontrolled pain   Complete by:  As directed    Call MD for:  temperature >100.4   Complete by:  As directed    Diet - low sodium heart healthy   Complete by:  As directed    Discharge instructions   Complete by:  As directed    Oletta Darter,   It has been a pleasure working with you and we are  glad you're feeling better. You were hospitalized for a perinephric hematoma (bleeding around your kidneys), your anticoagulation was held and you required blood transfusion to keep your blood levels up. We have restarted you coumadin and your blood counts have been stable.  You will need to follow up with urology, please contact them to set up an appointment, you will need repeat imaging in a few months  For your mechanical valve,  START taking coumadin 2.5 mg daily and 5 mg on Wednesday and Sunday  Please go to get labs done on Thursday to check your INR Please follow up with your cardiologist early next week for hospital follow up  You can resume other medications  If your symptoms worsen or you develop new symptoms, please seek medical help whether it is your primary care provider or emergency department.  Thank you for letting us be a part of your care.   Increase activity slowly   Complete by:  As directed       Signed: Asencion Noble, MD 08/08/2018, 1:23 PM

## 2018-08-04 NOTE — Progress Notes (Signed)
ANTICOAGULATION CONSULT NOTE - Follow Up Consult  Pharmacy Consult for heparin Indication: AVR  Labs: Recent Labs    08/01/18 1354  08/02/18 0619 08/02/18 1241 08/02/18 1934 08/03/18 0733 08/03/18 1912 08/04/18 0402  HGB  --    < > 7.7*  7.5*  --  7.4* 7.1* 7.7* 7.3*  HCT  --    < > 25.6*  25.7*  --  24.5* 23.0* 25.2* 23.9*  PLT  --    < > 411*  396  --  356 384 418* 379  LABPROT  --    < > 70.0*  --  22.6* 19.6*  --   --   INR  --    < > 8.7*  --  2.0* 1.7*  --   --   HEPARINUNFRC  --   --   --   --   --   --  <0.10* 0.12*  CREATININE 7.95*  --   --  8.88*  --  6.91*  6.83*  --   --    < > = values in this interval not displayed.    Assessment: 53yo male remains subtherapeutic on heparin after rate change; no gtt issues or signs of bleeding per RN.  Goal of Therapy:  Heparin level 0.3-0.5 units/ml   Plan:  Will increase heparin gtt by 2 units/kg/hr to 1900 units/hr and check level in 8 hours.    Wynona Neat, PharmD, BCPS  08/04/2018,6:02 AM

## 2018-08-04 NOTE — Progress Notes (Signed)
Patient ID: [name] [name], male   DOB: 03-20-1966, 53 y.o.   MRN: [id number]  [facility] Progress Note   Assessment/ Plan:   1.  Left perinephric/intracystic hematoma: Ongoing pain management and concomitant treatment of acute blood loss anemia.    Appreciate input from urology, outpatient follow-up plans noted; currently no indication for antiembolic therapy. 2.  End-stage renal disease: Continue MWF HD schedule (heparin free). 3.  Anemia: With component of acute blood loss arising from perinephric/bledding into complex? Renal cyst- relatively stable H/H. 4.  Secondary hyperparathyroidism:  Mild hyperphosphatemia noted on labs, resumed Hectorol for PTH suppression. 5.  Hypertension: Blood pressures on the lower side, antihypertensive therapy on hold and plans noted for PRBC transfusion.  Subjective:   Reports to be feeling better today with tolerable flank pain. Looking forward to going home today.   Objective:   BP (!) 97/49   Pulse (!) 104   Temp 98.8 F (37.1 C) (Oral)   Resp 20   Ht 6' (1.829 m)   Wt 134.6 kg   SpO2 99%   BMI 40.25 kg/m   Physical Exam: Gen: Comfortably resting in HD CVS: Pulse regular tachycardia, ejection systolic murmur over apex Resp: Anteriorly clear to auscultation, no rales Abd: Soft, obese, left upper quadrant tenderness Ext: No lower extremity edema, left forearm AVF cannulated  Labs: BMET Recent Labs  Lab 08/01/18 1312 08/01/18 1354 08/02/18 1241 08/03/18 0733  NA 137 137 134* 136  135  K 3.4* 3.6 4.3 4.1  4.1  CL 97* 97* 94* 95*  95*  CO2 27 28 25 28  28   GLUCOSE 110* 107* 118* 95  101*  BUN 33* 33* 42* 33*  34*  CREATININE 8.03* 7.95* 8.88* 6.91*  6.83*  CALCIUM 7.9* 7.8* 7.6* 8.1*  8.1*  PHOS  --   --  5.6* 6.1*   CBC Recent Labs  Lab 08/01/18 1213  08/02/18 0619 08/02/18 1934 08/03/18 0733 08/03/18 1912 08/04/18 0402  WBC 15.0*   < > 20.3*  20.6* 23.2* 19.6* 19.6* 17.8*  NEUTROABS 13.2*  --  17.7*   --   --   --   --   HGB 8.9*   < > 7.7*  7.5* 7.4* 7.1* 7.7* 7.3*  HCT 29.5*   < > 25.6*  25.7* 24.5* 23.0* 25.2* 23.9*  MCV 84.0   < > 85.6  86.0 82.5 83.6 83.2 83.9  PLT 356   < > 411*  396 356 384 418* 379   < > = values in this interval not displayed.   Medications:    . sodium chloride   Intravenous Once  . sodium chloride   Intravenous Once  . amiodarone  200 mg Oral Daily  . atorvastatin  80 mg Oral q1800  . Chlorhexidine Gluconate Cloth  6 each Topical Q0600  . Chlorhexidine Gluconate Cloth  6 each Topical Q0600  . doxercalciferol  1.5 mcg Intravenous Q T,Th,Sa-HD  . lanthanum  2,000 mg Oral TID WC  . metoprolol tartrate  25 mg Oral BID  . midodrine  15 mg Oral TID WC  . multivitamin  1 tablet Oral Daily  . mupirocin ointment  1 application Nasal BID  . sodium chloride flush  3 mL Intravenous Q12H   Elmarie Shiley, MD 08/04/2018, 9:42 AM

## 2018-08-04 NOTE — Progress Notes (Signed)
ANTICOAGULATION CONSULT NOTE - Follow Up Consult  Pharmacy Consult for heparin Indication: AVR  Labs: Recent Labs    08/02/18 1241 08/02/18 1934 08/03/18 0733 08/03/18 1912 08/04/18 0402 08/04/18 1345  HGB  --  7.4* 7.1* 7.7* 7.3* 7.4*  HCT  --  24.5* 23.0* 25.2* 23.9* 23.6*  PLT  --  356 384 418* 379 416*  LABPROT  --  22.6* 19.6*  --  17.7*  --   INR  --  2.0* 1.7*  --  1.5*  --   HEPARINUNFRC  --   --   --  <0.10* 0.12* 0.27*  CREATININE 8.88*  --  6.91*  6.83*  --   --  4.28*    Assessment: 53yo male remains subtherapeutic at 0.27 on heparin after rate change; no gtt issues or signs of bleeding per RN.  Goal of Therapy:  Heparin level 0.3-0.5 units/ml   Plan:  Will increase heparin gtt by 2 units/kg/hr to 2100 units/hr and check level in 6 hours.    Mercadez Heitman A. Levada Dy, PharmD, [place] Please utilize Amion for appropriate phone number to reach the unit pharmacist ([place])   08/04/2018,3:08 PM

## 2018-08-04 NOTE — Progress Notes (Signed)
   Subjective: No overnight events. Patient reports feeling well this morning. Pain has improved. He said HD exacerbated his pain again but he is doing better overall. Denies any n/v or hematuria. All questions and concerns addressed.   Objective:  Vital signs in last 24 hours: Vitals:   08/04/18 0230 08/04/18 0240 08/04/18 0429 08/04/18 0537  BP: (!) 87/46 (!) 93/46 (!) 81/50 (!) 98/58  Pulse: (!) 101  (!) 103   Resp:   20   Temp:   98.9 F (37.2 C)   TempSrc:   Oral   SpO2: 95%  95%   Weight:   135.1 kg   Height:       Physical Exam Gen: seen comfortably resting in bed, no distress CV: RRR, mechanical click Lungs: CTA bilaterally, normal effort Abdomen: soft, non distended, no ttp, bowel sounds present   Assessment/Plan:  Principal Problem:   Renal hematoma, left, initial encounter Active Problems:   ESRD (end stage renal disease) (HCC)   Morbid obesity (HCC)   S/P aortic valve replacement with bileaflet mechanical valve   Anemia associated with acute blood loss  [name] is a 53 year old man with CAD s/p CABG, CHF, ESRD on HD, on Coumadin secondary to aortic valve replacement presenting with leftsided abdominal andflank pain.CT consistent with left sided perinephric hematoma.  Perinephric hematoma:in the setting of polycystic kidney disease, likely ruptured a hemorrhagic cyst with connection to collecting system. Given another 1 unit PRBC yesterday afternoon, post transfusion Hgb 7.7. This morning 7.3. He is also hypotensive, responding to IV boluses. Will repeat CT abdomen and consider IR consult for angiography/embolization.  - Trend CBC - Transfuse Hgb < 7, given 2 units PRBC so far - Cardiac monitoring -Pain control with hydrocodone-acetaminophen 1-2 tablets q6h prn - Zofran for nausea  Aortic valve replacement on coumadin Supra therapeutic BLT:[id number] to 1.5. Continue IV heparin. If CT abdomen is stable will resume coumadin today. - Heparin  per pharmacy  - Daily PT/INR  ESRD on HD: 2/2hypertension. Also history ofpolycystic kidney disease.Right arm fistula.  - HD today - Nephrology on board for HD, appreciate assistance   CAD s/p CABG  Chronic systolic heart failure: Most recent echo 10/2017 EF 30%. - C/w home medications include amiodarone, metoprolol, aspirin and midodrine -Holding aspirin  Dispo: Anticipated discharge pending clinical improvement.   Mike Craze, DO 08/04/2018, 7:33 AM Pager: [phone]

## 2018-08-04 NOTE — Progress Notes (Signed)
CRITICAL LAB VALUE Hemoglobin: 6.9  Internal medicine MD, [place], notified.  1 unit of RBC ordered. RN will implement.  Ermalinda Memos, RN

## 2018-08-04 NOTE — Progress Notes (Signed)
ANTICOAGULATION CONSULT NOTE - Follow Up Consult  Pharmacy Consult for heparin Indication: AVR  Labs: Recent Labs    08/02/18 1241 08/02/18 1934 08/03/18 0733  08/03/18 1912 08/04/18 0402 08/04/18 1345 08/04/18 2126  HGB  --  7.4* 7.1*  --  7.7* 7.3* 7.4* 6.9*  HCT  --  24.5* 23.0*  --  25.2* 23.9* 23.6* 22.3*  PLT  --  356 384  --  418* 379 416*  --   LABPROT  --  22.6* 19.6*  --   --  17.7*  --   --   INR  --  2.0* 1.7*  --   --  1.5*  --   --   HEPARINUNFRC  --   --   --    < > <0.10* 0.12* 0.27* 0.20*  CREATININE 8.88*  --  6.91*  6.83*  --   --   --  4.28*  --    < > = values in this interval not displayed.    Assessment: 53yo male on Coumadin 5mg  daily exc 2.5mg  on Sun/Wed PTA for mechanical AVR. Goal INR is 2-3 per anticoag clinic note. Now with hematuria. Noted w/ large left perinephric hematoma (likely ruptured a hemorraghic cyst).   Heparin level remains low at 0.20, despite rate increase this afternoon. H/H slightly lower and pt to receive blood. RN discussed with IMTS and they want heparin therapy to continue for now. As heparin level is low will increase infusion rate slightly given mechanical valve.   Goal of Therapy:  Heparin level 0.3-0.5 units/ml   Plan:  Increase heparin slightly to 2150 units/hr Check 8hr heparin level and CBC   Arrie Senate, PharmD, BCPS Clinical Pharmacist [phone] Please check AMION for all [facility] numbers 08/04/2018

## 2018-08-05 LAB — TYPE AND SCREEN
ABO/RH(D): B POS
Antibody Screen: NEGATIVE
Unit division: 0
Unit division: 0
Unit division: 0

## 2018-08-05 LAB — BPAM RBC
Blood Product Expiration Date: 202004232359
Blood Product Expiration Date: 202004232359
Blood Product Expiration Date: 202004272359
ISSUE DATE / TIME: 202004161323
ISSUE DATE / TIME: 202004171209
ISSUE DATE / TIME: 202004182345
Unit Type and Rh: 7300
Unit Type and Rh: 7300
Unit Type and Rh: 7300

## 2018-08-05 LAB — CBC
HCT: 24.4 % — ABNORMAL LOW (ref 39.0–52.0)
Hemoglobin: 7.6 g/dL — ABNORMAL LOW (ref 13.0–17.0)
MCH: 26.3 pg (ref 26.0–34.0)
MCHC: 31.1 g/dL (ref 30.0–36.0)
MCV: 84.4 fL (ref 80.0–100.0)
Platelets: 352 10*3/uL (ref 150–400)
RBC: 2.89 MIL/uL — ABNORMAL LOW (ref 4.22–5.81)
RDW: 16.6 % — ABNORMAL HIGH (ref 11.5–15.5)
WBC: 14.4 10*3/uL — ABNORMAL HIGH (ref 4.0–10.5)
nRBC: 0 % (ref 0.0–0.2)

## 2018-08-05 LAB — BASIC METABOLIC PANEL
Anion gap: 9 (ref 5–15)
BUN: 28 mg/dL — ABNORMAL HIGH (ref 6–20)
CO2: 30 mmol/L (ref 22–32)
Calcium: 8.6 mg/dL — ABNORMAL LOW (ref 8.9–10.3)
Chloride: 94 mmol/L — ABNORMAL LOW (ref 98–111)
Creatinine, Ser: 6.85 mg/dL — ABNORMAL HIGH (ref 0.61–1.24)
GFR calc Af Amer: 10 mL/min — ABNORMAL LOW (ref >60)
GFR calc non Af Amer: 8 mL/min — ABNORMAL LOW (ref >60)
Glucose, Bld: 79 mg/dL (ref 70–99)
Potassium: 4 mmol/L (ref 3.5–5.1)
Sodium: 133 mmol/L — ABNORMAL LOW (ref 135–145)

## 2018-08-05 LAB — HEPARIN LEVEL (UNFRACTIONATED)
Heparin Unfractionated: 0.24 IU/mL — ABNORMAL LOW (ref 0.30–0.70)
Heparin Unfractionated: 0.38 IU/mL (ref 0.30–0.70)
Heparin Unfractionated: 0.37 IU/mL (ref 0.30–0.70)

## 2018-08-05 LAB — PROTIME-INR
INR: 1.4 — ABNORMAL HIGH (ref 0.8–1.2)
Prothrombin Time: 17 seconds — ABNORMAL HIGH (ref 11.4–15.2)

## 2018-08-05 LAB — HEMOGLOBIN AND HEMATOCRIT, BLOOD
HCT: 22.3 % — ABNORMAL LOW (ref 39.0–52.0)
Hemoglobin: 6.9 g/dL — CL (ref 13.0–17.0)

## 2018-08-05 LAB — MAGNESIUM: Magnesium: 1.9 mg/dL (ref 1.7–2.4)

## 2018-08-05 MED ORDER — WARFARIN SODIUM 5 MG PO TABS
5.0000 mg | ORAL_TABLET | Freq: Every day | ORAL | Status: DC
  Administered 2018-08-05: 5 mg via ORAL
  Filled 2018-08-05: qty 1

## 2018-08-05 NOTE — Progress Notes (Signed)
Internal Medicine Attending:   I saw and examined the patient. I reviewed the resident's note and I agree with the resident's findings and plan as documented in the resident's note. Clinically he is much improved he denies any active pain or complaints.  His white blood cell count is continuing to trend down.  Overall his hemoglobin in my view is relatively stable.  He was transfused an additional 1 unit last night and posttransfusion hemoglobin this morning was 7.6.  Repeat CT scan yesterday did not show much significant change of his hematoma.  I would plan to continue to monitor with daily CBCs while we resume his Coumadin.   pharmacy is dosing Coumadin goal INR 2-3 for mechanical aortic valve replacement received first dose after reversal on 4/18, continue heparin while subtherapeutic pharmacy also adjusting.

## 2018-08-05 NOTE — Progress Notes (Signed)
ANTICOAGULATION CONSULT NOTE - Follow Up Consult  Pharmacy Consult for heparin Indication: AVR  Labs: Recent Labs    08/03/18 0733  08/04/18 0402 08/04/18 1345 08/04/18 2126 08/05/18 0630 08/05/18 1546  HGB 7.1*   < > 7.3* 7.4* 6.9* 7.6*  --   HCT 23.0*   < > 23.9* 23.6* 22.3* 24.4*  --   PLT 384   < > 379 416*  --  352  --   LABPROT 19.6*  --  17.7*  --   --  17.0*  --   INR 1.7*  --  1.5*  --   --  1.4*  --   HEPARINUNFRC  --    < > 0.12* 0.27* 0.20* 0.24* 0.38  CREATININE 6.91*  6.83*  --   --  4.28*  --   --   --    < > = values in this interval not displayed.    Assessment: 53yo male on Coumadin 5mg  daily exc 2.5mg  on Sun/Wed PTA for mechanical AVR. Goal INR is 2-3 per anticoag clinic note. Now with hematuria. Noted w/ large left perinephric hematoma (likely ruptured a hemorraghic cyst).   Heparin level 0.38 this PM.     Goal of Therapy:  Heparin level 0.3-0.5 units/ml   Plan:  Continue heparin at 2300 units/hr  Check 6hr confirmatory heparin level.    [name] [name] A. Levada Dy, PharmD, [place] Please utilize Amion for appropriate phone number to reach the unit pharmacist ([place])   08/05/2018

## 2018-08-05 NOTE — Progress Notes (Signed)
Subjective: Patient reports no complaints.  He is doing well this morning.  Objective: Vital signs in last 24 hours: Temp:  [97.5 F (36.4 C)-99 F (37.2 C)] 97.8 F (36.6 C) (04/19 0949) Pulse Rate:  [83-110] 83 (04/19 0949) Resp:  [17-24] 19 (04/19 0650) BP: (85-111)/(31-60) 107/58 (04/19 0949) SpO2:  [95 %-100 %] 100 % (04/19 0949) Weight:  [133.3 kg] 133.3 kg (04/18 1136)  Intake/Output from previous day: 04/18 0701 - 04/19 0700 In: 578.3 [I.V.:263.3; Blood:315] Out: 1379  Intake/Output this shift: Total I/O In: 240 [P.O.:240] Out: -   Physical Exam:  Watching TV, alert and oriented Abdomen soft and nontender No lower extremity pain or swelling  Lab Results: Recent Labs    08/04/18 1345 08/04/18 2126 08/05/18 0630  HGB 7.4* 6.9* 7.6*  HCT 23.6* 22.3* 24.4*   BMET Recent Labs    08/03/18 0733 08/04/18 1345  NA 136  135 135  K 4.1  4.1 3.4*  CL 95*  95* 95*  CO2 28  28 28   GLUCOSE 95  101* 85  BUN 33*  34* 14  CREATININE 6.91*  6.83* 4.28*  CALCIUM 8.1*  8.1* 9.0   Recent Labs    08/03/18 0733 08/04/18 0402 08/05/18 0630  INR 1.7* 1.5* 1.4*   No results for input(s): LABURIN in the last 72 hours. Results for orders placed or performed during the hospital encounter of 08/01/18  MRSA PCR Screening     Status: Abnormal   Collection Time: 08/01/18  6:08 PM  Result Value Ref Range Status   MRSA by PCR POSITIVE (A) NEGATIVE Final    Comment:        The GeneXpert MRSA Assay (FDA approved for NASAL specimens only), is one component of a comprehensive MRSA colonization surveillance program. It is not intended to diagnose MRSA infection nor to guide or monitor treatment for MRSA infections. RESULT CALLED TO, READ BACK BY AND VERIFIED WITH: Vicenta Aly RN 08/01/18 1958 JDW Performed at [facility], [place] [address] [place],  [address]     Studies/Results: Ct Renal Abd W/wo  Result Date: 08/04/2018 CLINICAL DATA:   Hematuria. EXAM: CT ABDOMEN WITHOUT AND WITH CONTRAST TECHNIQUE: Multidetector CT imaging of the abdomen was performed following the standard protocol before and following the bolus administration of intravenous contrast. CONTRAST:  1106mL OMNIPAQUE IOHEXOL 300 MG/ML  SOLN COMPARISON:  08/01/2018 FINDINGS: Lower chest: Coronary artery calcification is evident. Basilar atelectasis noted bilaterally, left greater than right. Right gynecomastia evident. Hepatobiliary: No focal abnormality in the liver on this study without intravenous contrast. Calcified gallstone noted dependent gallbladder lumen. High density material in the lumen of the gallbladder likely vicarious excretion from recent contrast infused CT. No intrahepatic or extrahepatic biliary dilation. Pancreas: No focal mass lesion. No dilatation of the main duct. No intraparenchymal cyst. No peripancreatic edema. Spleen: No splenomegaly. No focal mass lesion. Adrenals/Urinary Tract: No adrenal nodule or mass. Innumerable cysts are identified in the right kidney, similar to prior. No right hydroureter evident. Continued slight enlargement of the markedly abnormal and enlarged left kidney measuring 15.5 x 14.5 x 18.8 cm today compared to 15.0 x 13.1 x 18.3 cm previously. Similar appearance of the perinephric hematoma. Stomach/Bowel: Stomach is unremarkable. No gastric wall thickening. No evidence of outlet obstruction. Duodenum is normally positioned as is the ligament of Treitz. Visualize small bowel loops and colonic segments of the abdomen are nondilated. Vascular/Lymphatic: There is abdominal aortic atherosclerosis without aneurysm. Small hepato duodenal ligament lymph  nodes are stable. Other: No intraperitoneal free fluid. Musculoskeletal: No worrisome lytic or sclerotic osseous abnormality. IMPRESSION: 1. Similar appearance of the abnormally enlarged left kidney although it has increased slightly in size in the interval. As mentioned on CT from 3 days ago,  imaging features are compatible with hemorrhage into 1 or more the cystic lesions. Associated moderate left perinephric hematoma is similar to prior. 2. Innumerable cysts of varying size and attenuation in the right kidney, compatible with polycystic kidney disease. No definite right hydroureteronephrosis. 3. Anatomic pelvis was not imaged as part of this abdomen only CT. Electronically Signed   By: Misty Stanley M.D.   On: 08/04/2018 18:01    Assessment/Plan: Left perinephric hematoma with polycystic kidneys: Hemoglobin dropped slightly yesterday afternoon and blood pressure dipped a little lower.  Hemoglobin down to 6.9 and he was given a unit of blood.  I reviewed the CT images which showed no significant change in the left perinephric hematoma.  He is back up to 7.6. Ok to continue ambulation. Continue serial Hgb measurements. Reasonable to continue oral anticoagulation gradually get therapeutic. Will ultimately need repeat imaging of the left kidney in 3-4 months and will arrange as an outpatient. Will continue to follow. He is stable from a GU pt of view. Discussed importance of f/u imaging again with the patient.    LOS: 4 days   Festus Aloe 08/05/2018, 11:29 AM

## 2018-08-05 NOTE — Progress Notes (Signed)
ANTICOAGULATION CONSULT NOTE - Follow Up Consult  Pharmacy Consult for heparin Indication: AVR  Labs: Recent Labs    08/02/18 1241  08/03/18 0733  08/04/18 0402 08/04/18 1345 08/04/18 2126 08/05/18 0630  HGB  --    < > 7.1*   < > 7.3* 7.4* 6.9* 7.6*  HCT  --    < > 23.0*   < > 23.9* 23.6* 22.3* 24.4*  PLT  --    < > 384   < > 379 416*  --  352  LABPROT  --    < > 19.6*  --  17.7*  --   --  17.0*  INR  --    < > 1.7*  --  1.5*  --   --  1.4*  HEPARINUNFRC  --   --   --    < > 0.12* 0.27* 0.20* 0.24*  CREATININE 8.88*  --  6.91*  6.83*  --   --  4.28*  --   --    < > = values in this interval not displayed.    Assessment: 53yo male on Coumadin 5mg  daily exc 2.5mg  on Sun/Wed PTA for mechanical AVR. Goal INR is 2-3 per anticoag clinic note. Now with hematuria. Noted w/ large left perinephric hematoma (likely ruptured a hemorraghic cyst).   Heparin level remains low at 0.24 this AM.  RN discussed with IMTS and they want heparin therapy to continue for now. As heparin level is low will increase infusion rate slightly given mechanical valve. Will continue 5mg  warfarin tonight since INR dropped to 1.4   Goal of Therapy:  Heparin level 0.3-0.5 units/ml   Plan:  Increase heparin to 2300 units/hr Warfarin 5mg  Po x 1 tonight Check 8hr heparin level and CBC   Dai Mcadams A. Levada Dy, PharmD, [place] Please utilize Amion for appropriate phone number to reach the unit pharmacist ([place])   08/05/2018

## 2018-08-05 NOTE — Progress Notes (Signed)
   Subjective: Patient reports feeling well this morning. He says his abdominal pain has improved significantly. He denies abdominal pain, n/v. Denies feeling lightheaded or dizzy. All questions and concerns addressed.   Objective:  Vital signs in last 24 hours: Vitals:   08/05/18 0010 08/05/18 0057 08/05/18 0319 08/05/18 0650  BP: (!) 91/53 (!) 93/48 (!) 91/59 92/60  Pulse: 99 97 94 89  Resp:   18 19  Temp: 97.8 F (36.6 C)  98 F (36.7 C) (!) 97.5 F (36.4 C)  TempSrc: Oral  Oral Oral  SpO2: 99% 99% 96% 99%  Weight:      Height:       Physical Exam Gen: seen comfortably resting in bed, no distress  CV: RRR, mechanical click Abdomen: soft, non distended, bowel sounds present, left sided ttp  Ext: no edema, warm and well perfused   Assessment/Plan:  Principal Problem:   Renal hematoma, left, initial encounter Active Problems:   ESRD (end stage renal disease) (HCC)   Morbid obesity (HCC)   S/P aortic valve replacement with bileaflet mechanical valve   Anemia associated with acute blood loss  [name] [name] is a 53 year old man with CAD s/p CABG, CHF, ESRD on HD, on Coumadin secondary to aortic valve replacement presenting with leftsided abdominal andflank pain.CT consistent with left sided perinephric hematoma.  Perinephric hematoma:in the setting of polycystic kidney disease, likely ruptured a hemorrhagic cyst with connection to collecting system. CT abdomen showed slight increase in size of hematoma. Overnight, he was remaining hypotensive with minimal response to fluid boluses so an H&H was checked, showed a hemoglobin of 6.9. Given another 1 unit PRBC for a total of 3 units, post transfusion Hgb 7.6. Will continue to monitor. - Trend CBC - Transfuse Hgb < 7, given 3 units PRBC so far - Cardiac monitoring -Pain control with hydrocodone-acetaminophen 1-2 tablets q6h prn - Zofran for nausea  Aortic valve replacement on coumadin Supra therapeutic JQG:BEE1.0.  Continue IV heparin. Started coumadin yesterday. He will need to be in therapeutic range on coumadin before discharge.  - Heparin and coumadin per pharmacy - Daily PT/INR  ESRD on HD: 2/2hypertension. Also history ofpolycystic kidney disease.Right arm fistula.  - Last HD 4/18 - Nephrology on board for HD, appreciate assistance   CAD s/p CABG  Chronic systolic heart failure: Most recent echo 10/2017 EF 30%. - C/w home medications include amiodarone, metoprolol, aspirin and midodrine -Holding aspirin  Dispo: Anticipated discharge pending clinical improvement.   Rehman, Areeg N, DO 08/05/2018, 7:05 AM Pager: [phone]

## 2018-08-05 NOTE — Progress Notes (Signed)
ANTICOAGULATION CONSULT NOTE - Follow Up Consult  Pharmacy Consult for heparin Indication: AVR  Labs: Recent Labs    08/03/18 0733  08/04/18 0402 08/04/18 1345 08/04/18 2126 08/05/18 0630 08/05/18 1546 08/05/18 1709 08/05/18 2253  HGB 7.1*   < > 7.3* 7.4* 6.9* 7.6*  --   --   --   HCT 23.0*   < > 23.9* 23.6* 22.3* 24.4*  --   --   --   PLT 384   < > 379 416*  --  352  --   --   --   LABPROT 19.6*  --  17.7*  --   --  17.0*  --   --   --   INR 1.7*  --  1.5*  --   --  1.4*  --   --   --   HEPARINUNFRC  --    < > 0.12* 0.27* 0.20* 0.24* 0.38  --  0.37  CREATININE 6.91*  6.83*  --   --  4.28*  --   --   --  6.85*  --    < > = values in this interval not displayed.    Assessment/Plan:  53yo male remains therapeutic on heparin. Will continue gtt at current rate and monitor daily level.   Wynona Neat, PharmD, BCPS  08/05/2018,11:45 PM

## 2018-08-05 NOTE — Progress Notes (Addendum)
Central telemetry called to informed RN that patient's QTC was prolonged for 532 and running some single PVC's. MD made aware. Orders were given. Will continue to monitor the patient.  Britta Mccreedy RN

## 2018-08-05 NOTE — Progress Notes (Signed)
Patient ID: [name] [name], male   DOB: Mar 27, 1966, 53 y.o.   MRN: [id number]  [facility] Progress Note   Assessment/ Plan:   1.  Left perinephric/intracystic hematoma: Ongoing pain management and concomitant treatment of acute blood loss anemia.    Continues to have downtrending hemoglobin/hematocrit prohibitive to restarting his warfarin-CT scan report noted and significant for slight enlargement of hematoma.  May need to curbside interventional radiology to see if he would benefit from arteriogram/embolization at this point. 2.  End-stage renal disease: Underwent hemodialysis yesterday to continue outpatient TTS schedule-no acute dialysis indications today, appears euvolemic.  Limit fluid boluses-prefer using 25% albumin 25 to 50 g bolus. 3.  Anemia:  Further hemoglobin drop noted today, anticipate PRBC transfusion. 4.  Secondary hyperparathyroidism:  Mild hyperphosphatemia noted on labs, resumed Hectorol for PTH suppression. 5.  Hypertension: Blood pressures on the lower side, antihypertensive therapy on hold and anticipate PRBC transfusion.  Subjective:   Disappointed that he was not able to leave yesterday, further hemoglobin drop noted overnight with mild hypotension.  Denies significant abdominal pain.   Objective:   BP (!) 107/58 (BP Location: Right Arm)   Pulse 83   Temp 97.8 F (36.6 C) (Oral)   Resp 19   Ht 6' (1.829 m)   Wt 133.3 kg   SpO2 100%   BMI 39.86 kg/m   Physical Exam: Gen: Comfortably resting in bed CVS: Pulse regular rhythm, normal rate, ejection systolic murmur over apex Resp: Anteriorly clear to auscultation, no rales Abd: Soft, obese, left upper quadrant tenderness Ext: No lower extremity edema, left forearm AVF intact dressings.  Labs: BMET Recent Labs  Lab 08/01/18 1312 08/01/18 1354 08/02/18 1241 08/03/18 0733 08/04/18 1345  NA 137 137 134* 136  135 135  K 3.4* 3.6 4.3 4.1  4.1 3.4*  CL 97* 97* 94* 95*  95* 95*  CO2 27 28 25  28  28 28   GLUCOSE 110* 107* 118* 95  101* 85  BUN 33* 33* 42* 33*  34* 14  CREATININE 8.03* 7.95* 8.88* 6.91*  6.83* 4.28*  CALCIUM 7.9* 7.8* 7.6* 8.1*  8.1* 9.0  PHOS  --   --  5.6* 6.1* 3.7   CBC Recent Labs  Lab 08/01/18 1213  08/02/18 0619  08/03/18 1912 08/04/18 0402 08/04/18 1345 08/04/18 2126 08/05/18 0630  WBC 15.0*   < > 20.3*  20.6*   < > 19.6* 17.8* 18.2*  --  14.4*  NEUTROABS 13.2*  --  17.7*  --   --   --   --   --   --   HGB 8.9*   < > 7.7*  7.5*   < > 7.7* 7.3* 7.4* 6.9* 7.6*  HCT 29.5*   < > 25.6*  25.7*   < > 25.2* 23.9* 23.6* 22.3* 24.4*  MCV 84.0   < > 85.6  86.0   < > 83.2 83.9 84.0  --  84.4  PLT 356   < > 411*  396   < > 418* 379 416*  --  352   < > = values in this interval not displayed.   Medications:    . sodium chloride   Intravenous Once  . sodium chloride   Intravenous Once  . sodium chloride   Intravenous Once  . amiodarone  200 mg Oral Daily  . atorvastatin  80 mg Oral q1800  . Chlorhexidine Gluconate Cloth  6 each Topical Q0600  . doxercalciferol  1.5 mcg  Intravenous Q T,Th,Sa-HD  . lanthanum  2,000 mg Oral TID WC  . metoprolol tartrate  25 mg Oral BID  . midodrine  15 mg Oral TID WC  . multivitamin  1 tablet Oral Daily  . mupirocin ointment  1 application Nasal BID  . sodium chloride flush  3 mL Intravenous Q12H  . warfarin  5 mg Oral q1800  . Warfarin - Pharmacist Dosing Inpatient   Does not apply [id number]   Elmarie Shiley, MD 08/05/2018, 10:10 AM

## 2018-08-06 DIAGNOSIS — Z66 Do not resuscitate: Secondary | ICD-10-CM

## 2018-08-06 LAB — BASIC METABOLIC PANEL
Anion gap: 13 (ref 5–15)
BUN: 40 mg/dL — ABNORMAL HIGH (ref 6–20)
CO2: 24 mmol/L (ref 22–32)
Calcium: 8.1 mg/dL — ABNORMAL LOW (ref 8.9–10.3)
Chloride: 95 mmol/L — ABNORMAL LOW (ref 98–111)
Creatinine, Ser: 8.36 mg/dL — ABNORMAL HIGH (ref 0.61–1.24)
GFR calc Af Amer: 8 mL/min — ABNORMAL LOW (ref >60)
GFR calc non Af Amer: 7 mL/min — ABNORMAL LOW (ref >60)
Glucose, Bld: 96 mg/dL (ref 70–99)
Potassium: 4 mmol/L (ref 3.5–5.1)
Sodium: 132 mmol/L — ABNORMAL LOW (ref 135–145)

## 2018-08-06 LAB — CBC
HCT: 24.2 % — ABNORMAL LOW (ref 39.0–52.0)
Hemoglobin: 7.5 g/dL — ABNORMAL LOW (ref 13.0–17.0)
MCH: 26 pg (ref 26.0–34.0)
MCHC: 31 g/dL (ref 30.0–36.0)
MCV: 84 fL (ref 80.0–100.0)
Platelets: 353 10*3/uL (ref 150–400)
RBC: 2.88 MIL/uL — ABNORMAL LOW (ref 4.22–5.81)
RDW: 16.7 % — ABNORMAL HIGH (ref 11.5–15.5)
WBC: 9.1 10*3/uL (ref 4.0–10.5)
nRBC: 0 % (ref 0.0–0.2)

## 2018-08-06 LAB — PROTIME-INR
INR: 1.9 — ABNORMAL HIGH (ref 0.8–1.2)
Prothrombin Time: 21.7 seconds — ABNORMAL HIGH (ref 11.4–15.2)

## 2018-08-06 LAB — HEPARIN LEVEL (UNFRACTIONATED): Heparin Unfractionated: 0.46 IU/mL (ref 0.30–0.70)

## 2018-08-06 LAB — PHOSPHORUS: Phosphorus: 5.7 mg/dL — ABNORMAL HIGH (ref 2.5–4.6)

## 2018-08-06 LAB — MAGNESIUM: Magnesium: 2 mg/dL (ref 1.7–2.4)

## 2018-08-06 MED ORDER — CHLORHEXIDINE GLUCONATE CLOTH 2 % EX PADS
6.0000 | MEDICATED_PAD | Freq: Every day | CUTANEOUS | Status: DC
  Administered 2018-08-07: 6 via TOPICAL

## 2018-08-06 MED ORDER — WARFARIN SODIUM 2.5 MG PO TABS
2.5000 mg | ORAL_TABLET | Freq: Once | ORAL | Status: AC
  Administered 2018-08-06: 2.5 mg via ORAL
  Filled 2018-08-06: qty 1

## 2018-08-06 NOTE — Progress Notes (Addendum)
ANTICOAGULATION CONSULT NOTE - Follow Up Consult  Pharmacy Consult for heparin/warfarin Indication: AVR  Labs: Recent Labs    08/04/18 0402 08/04/18 1345 08/04/18 2126 08/05/18 0630 08/05/18 1546 08/05/18 1709 08/05/18 2253 08/06/18 0659  HGB 7.3* 7.4* 6.9* 7.6*  --   --   --  7.5*  HCT 23.9* 23.6* 22.3* 24.4*  --   --   --  24.2*  PLT 379 416*  --  352  --   --   --  353  LABPROT 17.7*  --   --  17.0*  --   --   --  21.7*  INR 1.5*  --   --  1.4*  --   --   --  1.9*  HEPARINUNFRC 0.12* 0.27* 0.20* 0.24* 0.38  --  0.37 0.46  CREATININE  --  4.28*  --   --   --  6.85*  --   --     Assessment: 53yo male on Coumadin 5mg  daily exc 2.5mg  on Sun/Wed PTA for mechanical AVR. Goal INR is 2-3 per anticoag clinic note. Now with hematuria. Noted w/ large left perinephric hematoma (likely ruptured a hemorraghic cyst).   Heparin level 0.46 this AM. No bleeding reported.    INR 1.4>>1.9 after one dose of 5mg . Will give lower dose to avoid overshooting goal of 2-3    Goal of Therapy:  INR 2-3 Heparin level 0.3-0.5 units/ml   Plan:  Continue heparin at 2300 units/hr Warfarin 2.5mg  PO x 1 today Daily Heparin level and INR Monitor for bleeding   Willisha Sligar A. Levada Dy, PharmD, [place] Please utilize Amion for appropriate phone number to reach the unit pharmacist ([place])   08/06/2018

## 2018-08-06 NOTE — Progress Notes (Signed)
Patient ID: [name] [name], male   DOB: [birth year], 53 y.o.   MRN: [id number]    Subjective: Pt received one unit of PRBCs on Saturday for Hgb of 6.9. His pain has now resolved for the most part.  Objective: Vital signs in last 24 hours: Temp:  [98 F (36.7 C)-98.1 F (36.7 C)] 98.1 F (36.7 C) (04/20 0847) Pulse Rate:  [85-90] 90 (04/20 0847) Resp:  [18-20] 18 (04/20 0847) BP: (100-104)/(62-67) 104/67 (04/20 0847) SpO2:  [97 %-100 %] 97 % (04/20 0847)  Intake/Output from previous day: 04/19 0701 - 04/20 0700 In: 240 [P.O.:240] Out: 0  Intake/Output this shift: Total I/O In: 240 [P.O.:240] Out: -   Physical Exam:  General: Alert and oriented Abd: No CVAT, minimal LUQ tenderness  Lab Results: Recent Labs    08/04/18 2126 08/05/18 0630 08/06/18 0659  HGB 6.9* 7.6* 7.5*  HCT 22.3* 24.4* 24.2*   BMET Recent Labs    08/04/18 1345 08/05/18 1709  NA 135 133*  K 3.4* 4.0  CL 95* 94*  CO2 28 30  GLUCOSE 85 79  BUN 14 28*  CREATININE 4.28* 6.85*  CALCIUM 9.0 8.6*   Lab Results  Component Value Date   INR 1.9 (H) 08/06/2018   INR 1.4 (H) 08/05/2018   INR 1.5 (H) 08/04/2018     Studies/Results: No results found.  Assessment/Plan: 1) Left perinephric hematoma: Hgb stable with INR almost therapeutic.  Pain resolved.  If Hgb remains stable tomorrow, ok to discharge from a urologic standpoint.  Will arrange outpatient follow up in a few months for renal imaging to ensure to concerning underlying renal mass as etiology for bleed.   LOS: 5 days   Dutch Gray 08/06/2018, 4:05 PM

## 2018-08-06 NOTE — Progress Notes (Signed)
Patient's qtc is prolonged. Per telemetry qtc was documented in 700s. Requested nursing to do ekg and qtc on that was in 540s.   -stop hydrocodone  -stop amiodarone  -ordered mg, bmp, and phosphorus -continue cardiac monitoring  Lars Mage, MD Internal Medicine PGY2 Pager:[phone] 08/06/2018, 4:41 PM

## 2018-08-06 NOTE — TOC Initial Note (Signed)
Transition of Care [facility]) - Initial/Assessment Note    Patient Details  Name: [name] [name] MRN: [id number] Date of Birth: 1965-06-22  Transition of Care [facility]) CM/SW Contact:    Bartholomew Crews, RN Phone Number: [phone] 08/06/2018, 2:35 PM  Clinical Narrative:                 Spoke with patient at bedside. Denies any issues or concerns at home. No problems with getting to/from hemodialysis. States that he has been doing this 18 years and he understands what he needs to do. Will have transportation home from hospital when ready for discharge. No questions or concerns about medications. Understands dietary restrictions and lab monitoring for coumadin. States he is hopeful to go home tomorrow pending lab results. No transition of care needs identified at this time.   Expected Discharge Plan: Home/Self Care Barriers to Discharge: Continued Medical Work up   Patient Goals and CMS Choice     Choice offered to / list presented to : NA  Expected Discharge Plan and Services Expected Discharge Plan: Home/Self Care In-house Referral: NA Discharge Planning Services: CM Consult                     DME Arranged: N/A DME Agency: NA HH Arranged: NA HH Agency: NA  Prior Living Arrangements/Services     Patient language and need for interpreter reviewed:: Yes              Criminal Activity/Legal Involvement Pertinent to Current Situation/Hospitalization: No - Comment as needed  Activities of Daily Living      Permission Sought/Granted                  Emotional Assessment Appearance:: Appears stated age Attitude/Demeanor/Rapport: Engaged Affect (typically observed): Accepting Orientation: : Oriented to  Time, Oriented to Place, Oriented to Situation, Oriented to Self   Psych Involvement: No (comment)  Admission diagnosis:  Acute post-hemorrhagic anemia [D62] Polycystic kidney disease [Q61.3] Perinephric hematoma [S37.019A] Supratherapeutic INR [R79.1] ESRD on  hemodialysis ([place]) [N18.6, Z99.2] Patient Active Problem List   Diagnosis Date Noted  . Anemia associated with acute blood loss 08/02/2018  . Renal hematoma, left, initial encounter 08/01/2018  . 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 ICD (implantable cardioverter-defibrillator) pocket hematoma 01/01/2018  . Encounter for therapeutic drug monitoring 09/14/2017  . S/P aortic valve replacement with bileaflet mechanical valve 09/01/2017  . S/P CABG x 1 09/01/2017  . Coronary artery disease involving native coronary artery of native heart without angina pectoris   . 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 Abnormal echocardiogram   . Mitral regurgitation   . Severe aortic insufficiency   . Acute on chronic combined systolic and diastolic heart failure ([place])   . Systolic CHF ([place]) 26/71/2458  . 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 Dermatitis 04/06/2016  . End stage renal disease ([place]) 12/22/2011  . Sinus tachycardia   . ESRD (end stage renal disease) ([place])   . Orthostatic hypotension   . Chest discomfort   . Morbid obesity ([place])   . Syncope   . Aneurysm [facility])    PCP:  Patient, No Pcp Per Pharmacy:   [facility] (ESRD Bundle Only) - [place], [address] [place] [address] Phone: [phone] Fax: [place] [address], [place] [address] Phone: [phone] Fax: [phone]  [facility] -  [place], [place] - [address] [place] [address] Phone: [phone] Fax: [phone]     Social Determinants of Health (SDOH) Interventions    Readmission Risk Interventions No flowsheet data found.

## 2018-08-06 NOTE — Progress Notes (Addendum)
   Subjective: He states that he is feeling much better. He has a normal appetite and reports having a bowel movement 2 days ago ("normal for him").  He denies any new complaints today.  He reports that he does want to go home, we discussed that given his bleeding and low blood levels we would like to watch him at least for another day, he is in agreement with this plan.   Objective:  Vital signs in last 24 hours: Vitals:   08/05/18 0949 08/05/18 1649 08/05/18 2137 08/06/18 0450  BP: (!) 107/58 100/66 102/63 102/62  Pulse: 83 86 85 86  Resp:  19 18 20   Temp: 97.8 F (36.6 C) 98 F (36.7 C)  98.1 F (36.7 C)  TempSrc: Oral Oral  Oral  SpO2: 100% 100% 100% 98%  Weight:      Height:        General: Lying in bed in no acute distress.  Cardiac: RRR, mechanical click Pulmonary: CTABL, no wheezing or rhonchi Abdomen: Soft, non-tender, non-distended Extremity: No LE edema  Assessment/Plan:  Principal Problem:   Renal hematoma, left, initial encounter Active Problems:   ESRD (end stage renal disease) (HCC)   Morbid obesity (HCC)   S/P aortic valve replacement with bileaflet mechanical valve   Anemia associated with acute blood loss . This is a 53 year old male with a history of CAD status post CABG, CHF, ESRD on HD TTS, mechanical AVR on Coumadin who presented with left-sided abdominal pain and flank pain.  CT abdomen showed left-sided perinephric hematoma.  Perinephric hematoma: Patient has a history of polycystic kidney disease, hematoma may be due to ruptured hemorrhagic cyst.  Repeat CT abdomen on 4/18 showed findings compatible with hemorrhage into 1 more of the cystic lesions, and moderate left perinephric hematoma similar to prior.  Patient is status post 3 units of packed red blood cells.  Hemoglobin yesterday was 7.6 and today is 7.5, stable from yesterday.  He has been restarted on his Coumadin for his mechanical AVR.  Urology saw him yesterday and he is stable from GU point  of view, will follow-up in 3 to 4 months and have repeat imaging. If he is therapeutic tomorrow and his Hgb remains stable may be stable for discharge tomorrow.  -Nephology following, appreciate recommendations -Daily CBC, transfusion goal of <7  -Cardiac monitoring -Pain control with hydrocodone-acetaminopehn 1-2 tablets q6 hr PRN -Zofran PRN  AVR on coumadin Supratherapeutic INR: -Goal INR is 2-3 for his mechanical AVR, he is currently on heparin being bridged to coumain. INR today is 1.9.  -Heparin and coumadin per pharmacy, appreciate assistance -Daily PT/INR  ESRD on HD: -2/2 hypertension, history of polycystic kidney disease. Right arm fistula in place. Last HD on 4/18.  -Neprhology on board, appreciate assistance  CAD s/p CABG Chronic systolic HF -Echo on 4/82 showed an EF of 30%. Home medications include amiodarone, metoprolol, ASA, and midodrine. Continuing home medications.   FEN: No fluids, replete lytes prn, renal/carb mod diet VTE ppx: Heparin Code Status: DNR   Dispo: Anticipated discharge is pending clinical improvement.   Asencion Noble, MD 08/06/2018, 6:34 AM Pager: [phone]

## 2018-08-06 NOTE — Progress Notes (Signed)
Patient ID: [name] [name], male   DOB: April 04, 1966, 53 y.o.   MRN: [id number]  [facility] Progress Note   Assessment/ Plan:   1.  Left perinephric/intracystic hematoma:  Ongoing pain management and concomitant treatment of acute blood loss anemia.  Setting of anticoagulation.  S/p urology evaluation   2.  End-stage renal disease:  HD per TTS schedule; normally TTS at [facility] per pt (befakadu)  3.  Anemia:  2/2 acute blood loss with hematoma as well as chronic disease/ESRD.  S/p PRBC's  4.  Secondary hyperparathyroidism:  Mild hyperphosphatemia noted on labs, resumed Hectorol for PTH suppression.   5.  Reported Hx Hypertension: Blood pressures on the lower side, antihypertensive therapy on hold  6. AVR - on coumadin per primary team   7. Chronic systolic CHF  - Noted EF 49%  - optimize volume status with  HD   Subjective:   Patient reports PRBC's yesterday.  He feels well and feels comfortable receiving HD tomorrow.  Review of systems:  Denies shortness of breath or chest pain Denies nausea or vomiting    Objective:   BP 104/67 (BP Location: Right Arm)   Pulse 90   Temp 98.1 F (36.7 C) (Oral)   Resp 18   Ht 6' (1.829 m)   Wt 133.3 kg   SpO2 97%   BMI 39.86 kg/m   Physical Exam: Gen: Comfortably resting in bed  CVS: Pulse regular rhythm, normal rate, ejection systolic murmur over apex Resp: clear anteriorly on auscultation; unlabored Abd: Soft, obese, left upper quadrant tenderness Ext: No lower extremity edema, left forearm AVF intact dressings.  Labs: BMET Recent Labs  Lab 08/01/18 1312 08/01/18 1354 08/02/18 1241 08/03/18 0733 08/04/18 1345 08/05/18 1709  NA 137 137 134* 136  135 135 133*  K 3.4* 3.6 4.3 4.1  4.1 3.4* 4.0  CL 97* 97* 94* 95*  95* 95* 94*  CO2 27 28 25 28  28 28 30   GLUCOSE 110* 107* 118* 95  101* 85 79  BUN 33* 33* 42* 33*  34* 14 28*  CREATININE 8.03* 7.95* 8.88* 6.91*  6.83* 4.28* 6.85*  CALCIUM 7.9*  7.8* 7.6* 8.1*  8.1* 9.0 8.6*  PHOS  --   --  5.6* 6.1* 3.7  --    CBC Recent Labs  Lab 08/01/18 1213  08/02/18 0619  08/04/18 0402 08/04/18 1345 08/04/18 2126 08/05/18 0630 08/06/18 0659  WBC 15.0*   < > 20.3*  20.6*   < > 17.8* 18.2*  --  14.4* 9.1  NEUTROABS 13.2*  --  17.7*  --   --   --   --   --   --   HGB 8.9*   < > 7.7*  7.5*   < > 7.3* 7.4* 6.9* 7.6* 7.5*  HCT 29.5*   < > 25.6*  25.7*   < > 23.9* 23.6* 22.3* 24.4* 24.2*  MCV 84.0   < > 85.6  86.0   < > 83.9 84.0  --  84.4 84.0  PLT 356   < > 411*  396   < > 379 416*  --  352 353   < > = values in this interval not displayed.   Medications:    . amiodarone  200 mg Oral Daily  . atorvastatin  80 mg Oral q1800  . doxercalciferol  1.5 mcg Intravenous Q T,Th,Sa-HD  . lanthanum  2,000 mg Oral TID WC  . metoprolol tartrate  25 mg  Oral BID  . midodrine  15 mg Oral TID WC  . multivitamin  1 tablet Oral Daily  . mupirocin ointment  1 application Nasal BID  . sodium chloride flush  3 mL Intravenous Q12H  . warfarin  2.5 mg Oral ONCE-1800  . Warfarin - Pharmacist Dosing Inpatient   Does not apply q1800    Claudia Desanctis 08/06/2018 12:37 pm

## 2018-08-07 DIAGNOSIS — I4581 Long QT syndrome: Secondary | ICD-10-CM

## 2018-08-07 DIAGNOSIS — D62 Acute posthemorrhagic anemia: Secondary | ICD-10-CM

## 2018-08-07 LAB — CBC
HCT: 26.4 % — ABNORMAL LOW (ref 39.0–52.0)
Hemoglobin: 7.9 g/dL — ABNORMAL LOW (ref 13.0–17.0)
MCH: 25 pg — ABNORMAL LOW (ref 26.0–34.0)
MCHC: 29.9 g/dL — ABNORMAL LOW (ref 30.0–36.0)
MCV: 83.5 fL (ref 80.0–100.0)
Platelets: 380 10*3/uL (ref 150–400)
RBC: 3.16 MIL/uL — ABNORMAL LOW (ref 4.22–5.81)
RDW: 16.5 % — ABNORMAL HIGH (ref 11.5–15.5)
WBC: 9.9 10*3/uL (ref 4.0–10.5)
nRBC: 0 % (ref 0.0–0.2)

## 2018-08-07 LAB — PROTIME-INR
INR: 2.2 — ABNORMAL HIGH (ref 0.8–1.2)
Prothrombin Time: 24.4 seconds — ABNORMAL HIGH (ref 11.4–15.2)

## 2018-08-07 LAB — HEPARIN LEVEL (UNFRACTIONATED): Heparin Unfractionated: 0.39 IU/mL (ref 0.30–0.70)

## 2018-08-07 MED ORDER — DOXERCALCIFEROL 4 MCG/2ML IV SOLN
INTRAVENOUS | Status: AC
  Administered 2018-08-07: 1.5 ug via INTRAVENOUS
  Filled 2018-08-07: qty 2

## 2018-08-07 MED ORDER — MIDODRINE HCL 5 MG PO TABS
ORAL_TABLET | ORAL | Status: AC
  Administered 2018-08-07: 15 mg via ORAL
  Filled 2018-08-07: qty 15

## 2018-08-07 MED ORDER — WARFARIN SODIUM 3 MG PO TABS
3.0000 mg | ORAL_TABLET | Freq: Once | ORAL | Status: DC
  Filled 2018-08-07: qty 1

## 2018-08-07 MED ORDER — WARFARIN SODIUM 5 MG PO TABS: ORAL_TABLET | ORAL | 1 refills | Status: DC

## 2018-08-07 NOTE — Progress Notes (Signed)
   Subjective: Seen in dialysis this morning.  He does not have any new complaints. He is overall feeling better, and reports that he would like to go home.  We discussed that we were holding some of his medications due to an abnormality on his EKG that we will need to repeat that before he is discharged.  Discussed plan for today and he is in agreement.  Objective:  Vital signs in last 24 hours: Vitals:   08/07/18 0739 08/07/18 0744 08/07/18 0800 08/07/18 0830  BP: (!) 97/47 (!) 86/43 (!) 94/51 (!) 88/40  Pulse: (!) 117 (!) 115 (!) 111 (!) 118  Resp: (!) 22 (!) 22 (!) 26 (!) 24  Temp: 98.3 F (36.8 C)     TempSrc: Oral     SpO2: 96%     Weight: 136 kg     Height:        General: Lying in bed in no acute distress Cardiac: Tachycardic, normal rhythm, no murmurs rubs or gallops Pulmonary: CTAB, normal WOB Abdomen: Normoactive BS's, non-tender to palpation Psychiatry: Normal affect, behavior, and mood    Assessment/Plan:  Principal Problem:   Renal hematoma, left, initial encounter Active Problems:   ESRD (end stage renal disease) (HCC)   Morbid obesity (HCC)   S/P aortic valve replacement with bileaflet mechanical valve   Anemia associated with acute blood loss  This is a 53 year old male with a history of CAD status post CABG, CHF, ESRD on HD TTS, mechanical AVR on Coumadin who presented with left-sided abdominal pain and flank pain.  CT abdomen showed left-sided perinephric hematoma.  Perinephric hematoma: Patient has a history of polycystic kidney disease, hematoma may be due to ruptured hemorrhagic cyst.  Repeat CT abdomen on 4/18 showed findings compatible with hemorrhage into 1 more of the cystic lesions, and moderate left perinephric hematoma similar to prior.  Patient is status post 3 units of packed red blood cells.  Hemoglobin has been stable, today at his 7.9 up from 7.5 yesterday.  He has been restarted on his Coumadin for his mechanical AVR and is now therapeutic  with an INR of 2.2.  He denies any abdominal pain. -Urology is following, he is stable from GU point of view, will follow-up in 3 to 4 months and have repeat imaging. -Cardiac monitoring -Patient is stable for discharge today, will need follow-up in 1 week to repeat labs  -Zofran PRN   AVR on coumadin Supratherapeutic INR: -Goal INR is 2-3 for his mechanical AVR, INR today was 2.2. -Continue heparin -Continue Coumadin per pharmacy -Daily PT/INR  ESRD on HD: -2/2 hypertension, history of polycystic kidney disease. Right arm fistula in place. In HD today.  -Neprhology on board, appreciate assistance  CAD s/p CABG Chronic systolic HF -Echo on 3/29 showed an EF of 30%. Home medications include amiodarone, metoprolol, ASA, and midodrine.  Amiodarone was connected to prolonged QTC, will repeat EKG and likely restart amiodarone due to tachycardia.  Continuing home medications.   Prolonged QTC: QTC was around 490 on EKG yesterday, his amiodarone and hydrocodone was held.  Mankin swallow ordered and showed a mildly elevated phos -Repeat EKG today, if QTC is improved can restart amiodarone  FEN: No fluids, replete lytes prn, renal diet VTE ppx: Coumadin Code Status: DNR  Dispo: Anticipated discharge in approximately today  Asencion Noble, MD 08/07/2018, 8:40 AM Pager: [phone]

## 2018-08-07 NOTE — Progress Notes (Signed)
DISCHARGE NOTE HOME JUDA TOEPFER to be discharged Home per MD order. Discussed prescriptions and follow up appointments with the patient. Prescriptions given to patient; medication list explained in detail. Patient verbalized understanding.  Skin clean, dry and intact without evidence of skin break down, no evidence of skin tears noted. IV catheter discontinued intact. Site without signs and symptoms of complications. Dressing and pressure applied. No complaints noted.  Patient free of lines, drains, and wounds.   An After Visit Summary (AVS) was printed and given to the patient. Patient escorted via wheelchair, and discharged home via private auto.  Dorthey Sawyer, RN

## 2018-08-07 NOTE — Progress Notes (Signed)
ANTICOAGULATION CONSULT NOTE - Follow Up Consult  Pharmacy Consult for heparin/warfarin Indication: AVR  Labs: Recent Labs    08/04/18 1345  08/05/18 0630  08/05/18 1709 08/05/18 2253 08/06/18 0659 08/06/18 1643 08/07/18 0622  HGB 7.4*   < > 7.6*  --   --   --  7.5*  --  7.9*  HCT 23.6*   < > 24.4*  --   --   --  24.2*  --  26.4*  PLT 416*  --  352  --   --   --  353  --  380  LABPROT  --   --  17.0*  --   --   --  21.7*  --  24.4*  INR  --   --  1.4*  --   --   --  1.9*  --  2.2*  HEPARINUNFRC 0.27*   < > 0.24*   < >  --  0.37 0.46  --  0.39  CREATININE 4.28*  --   --   --  6.85*  --   --  8.36*  --    < > = values in this interval not displayed.    Assessment: 53yo male on Coumadin 5mg  daily exc 2.5mg  on Sun/Wed PTA for mechanical AVR. Goal INR is 2-3 per anticoag clinic note. Now with hematuria. Noted w/ large left perinephric hematoma (likely ruptured a hemorraghic cyst).   Heparin level 0.46 this AM. No bleeding reported.    INR 1.4>>1.9 after one dose of 5mg . INR now 2.2, Will give slightly higher dose today to try to maintain INR in goal range. HL 0.39 which is therapeutic.Will d/c heparin drip at this time with INR in range.     Goal of Therapy:  INR 2-3 Heparin level 0.3-0.5 units/ml   Plan:  D/C heparin drip Warfarin  3 mg PO x 1 today Daily INR Monitor for bleeding   Ranata Laughery A. Levada Dy, PharmD, [place] Please utilize Amion for appropriate phone number to reach the unit pharmacist ([place])   08/07/2018

## 2018-08-07 NOTE — TOC Transition Note (Signed)
Transition of Care [facility]) - CM/SW Discharge Note   Patient Details  Name: BRENSON HARTMAN MRN: [id number] Date of Birth: July 28, 1965  [facility]) CM/SW Contact:  Bartholomew Crews, RN Phone Number: [phone] 08/07/2018, 1:59 PM   Clinical Narrative:    Patient to transition home today. No transition of care needs identified.   Final next level of care: Home/Self Care Barriers to Discharge: No Barriers Identified   Patient Goals and CMS Choice     Choice offered to / list presented to : NA  Discharge Placement    Home                   Discharge Plan and Services In-house Referral: NA Discharge Planning Services: CM Consult            DME Arranged: N/A DME Agency: NA HH Arranged: NA HH Agency: NA   Social Determinants of Health (SDOH) Interventions     Readmission Risk Interventions No flowsheet data found.

## 2018-08-07 NOTE — Progress Notes (Signed)
Patient ID: [name] [name], male   DOB: October 15, 1965, 53 y.o.   MRN: [id number]  [facility] Progress Note   Assessment/ Plan:   1.  Left perinephric/intracystic hematoma:  Ongoing pain management and concomitant treatment of acute blood loss anemia.  Setting of anticoagulation.  S/p urology evaluation   2.  End-stage renal disease:  HD per TTS schedule; normally TTS at [facility] per pt (b[facility]ad[facility])  3.  Anemia:  2/2 acute blood loss with hematoma as well as chronic disease/ESRD.  S/p PRBC's.  stable on repeat today  4.  Secondary hyperparathyroidism with hyperphosphatemia noted on labs, resumed Hectorol for PTH suppression.   5.  Reported Hx Hypertension: Blood pressures on the lower side, antihypertensive therapy on hold  6. AVR - on coumadin per primary team   7. Chronic systolic CHF  - Noted EF 61%  - optimize volume status with  HD   Subjective:   Seen and examined on dialysis.  BP 98/61 which pt reports is near his baseline. Tolerating goal.  He reports that he is to be discharged home today.  Review of systems:  Denies shortness of breath or chest pain Denies nausea or vomiting    Objective:   BP (!) 92/49   Pulse (!) 120   Temp 98.3 F (36.8 C) (Oral)   Resp (!) 24   Ht 6' (1.829 m)   Wt 136 kg   SpO2 96%   BMI 40.66 kg/m   Physical Exam: Gen: Comfortably resting in bed  CVS: S1S2; tachycardia Resp: clear anteriorly on auscultation; unlabored Abd: Soft, obese, nontender on exam  Ext: No lower extremity edema, left forearm AVF   Labs: BMET Recent Labs  Lab 08/01/18 1312 08/01/18 1354 08/02/18 1241 08/03/18 0733 08/04/18 1345 08/05/18 1709 08/06/18 1643  NA 137 137 134* 136  135 135 133* 132*  K 3.4* 3.6 4.3 4.1  4.1 3.4* 4.0 4.0  CL 97* 97* 94* 95*  95* 95* 94* 95*  CO2 27 28 25 28  28 28 30 24   GLUCOSE 110* 107* 118* 95  101* 85 79 96  BUN 33* 33* 42* 33*  34* 14 28* 40*  CREATININE 8.03* 7.95* 8.88* 6.91*  6.83* 4.28*  6.85* 8.36*  CALCIUM 7.9* 7.8* 7.6* 8.1*  8.1* 9.0 8.6* 8.1*  PHOS  --   --  5.6* 6.1* 3.7  --  5.7*   CBC Recent Labs  Lab 08/01/18 1213  08/02/18 0619  08/04/18 1345 08/04/18 2126 08/05/18 0630 08/06/18 0659 08/07/18 0622  WBC 15.0*   < > 20.3*  20.6*   < > 18.2*  --  14.4* 9.1 9.9  NEUTROABS 13.2*  --  17.7*  --   --   --   --   --   --   HGB 8.9*   < > 7.7*  7.5*   < > 7.4* 6.9* 7.6* 7.5* 7.9*  HCT 29.5*   < > 25.6*  25.7*   < > 23.6* 22.3* 24.4* 24.2* 26.4*  MCV 84.0   < > 85.6  86.0   < > 84.0  --  84.4 84.0 83.5  PLT 356   < > 411*  396   < > 416*  --  352 353 380   < > = values in this interval not displayed.   Medications:    . atorvastatin  80 mg Oral q1800  . Chlorhexidine Gluconate Cloth  6 each Topical Q0600  . doxercalciferol  1.5 mcg Intravenous Q T,Th,Sa-HD  . lanthanum  2,000 mg Oral TID WC  . metoprolol tartrate  25 mg Oral BID  . midodrine  15 mg Oral TID WC  . multivitamin  1 tablet Oral Daily  . sodium chloride flush  3 mL Intravenous Q12H  . warfarin  3 mg Oral ONCE-1800  . Warfarin - Pharmacist Dosing Inpatient   Does not apply q1800    Claudia Desanctis 08/07/2018 10:23 am

## 2018-08-07 NOTE — Discharge Instructions (Signed)
[name] [name],   It has been a pleasure working with you and we are glad you're feeling better. You were hospitalized for a perinephric hematoma (bleeding around your kidneys), your anticoagulation was held and you required blood transfusion to keep your blood levels up. We have restarted you coumadin and your blood counts have been stable.  You will need to follow up with urology, please contact them to set up an appointment, you will need repeat imaging in a few months  For your mechanical valve,  START taking coumadin 2.5 mg daily and 5 mg on Wednesday and Sunday  Please go to get labs done on Thursday to check your INR Please follow up with your cardiologist early next week for hospital follow up  You can resume other medications  If your symptoms worsen or you develop new symptoms, please seek medical help whether it is your primary care provider or emergency department.  Thank you for letting us be a part of your care.

## 2018-08-09 ENCOUNTER — Telehealth: Payer: Self-pay | Admitting: Cardiovascular Disease

## 2018-08-09 ENCOUNTER — Ambulatory Visit (INDEPENDENT_AMBULATORY_CARE_PROVIDER_SITE_OTHER): Payer: Medicare Other | Admitting: *Deleted

## 2018-08-09 DIAGNOSIS — T82868D Thrombosis of vascular prosthetic devices, implants and grafts, subsequent encounter: Secondary | ICD-10-CM | POA: Diagnosis not present

## 2018-08-09 DIAGNOSIS — Z954 Presence of other heart-valve replacement: Secondary | ICD-10-CM | POA: Diagnosis not present

## 2018-08-09 DIAGNOSIS — Z5181 Encounter for therapeutic drug level monitoring: Secondary | ICD-10-CM

## 2018-08-09 LAB — POCT INR: INR: 3.8 — AB (ref 2.0–3.0)

## 2018-08-09 NOTE — Telephone Encounter (Signed)
Virtual Visit Pre-Appointment Phone Call  "(Name), I am calling you today to discuss your upcoming appointment. We are currently trying to limit exposure to the virus that causes COVID-19 by seeing patients at home rather than in the office."  1. "What is the BEST phone number to call the day of the visit?" - include this in appointment notes  2. Do you have or have access to (through a family member/friend) a smartphone with video capability that we can use for your visit?" a. If yes - list this number in appt notes as cell (if different from BEST phone #) and list the appointment type as a VIDEO visit in appointment notes b. If no - list the appointment type as a PHONE visit in appointment notes  3. Confirm consent - "In the setting of the current Covid19 crisis, you are scheduled for a (phone or video) visit with your provider on (date) at (time).  Just as we do with many in-office visits, in order for you to participate in this visit, we must obtain consent.  If you'd like, I can send this to your mychart (if signed up) or email for you to review.  Otherwise, I can obtain your verbal consent now.  All virtual visits are billed to your insurance company just like a normal visit would be.  By agreeing to a virtual visit, we'd like you to understand that the technology does not allow for your provider to perform an examination, and thus may limit your provider's ability to fully assess your condition. If your provider identifies any concerns that need to be evaluated in person, we will make arrangements to do so.  Finally, though the technology is pretty good, we cannot assure that it will always work on either your or our end, and in the setting of a video visit, we may have to convert it to a phone-only visit.  In either situation, we cannot ensure that we have a secure connection.  Are you willing to proceed?" STAFF: Did the patient verbally acknowledge consent to telehealth visit? Document  YES/NO here: Yes  4. Advise patient to be prepared - "Two hours prior to your appointment, go ahead and check your blood pressure, pulse, oxygen saturation, and your weight (if you have the equipment to check those) and write them all down. When your visit starts, your provider will ask you for this information. If you have an Apple Watch or Kardia device, please plan to have heart rate information ready on the day of your appointment. Please have a pen and paper handy nearby the day of the visit as well."  5. Give patient instructions for MyChart download to smartphone OR [email or web address] as below if video visit (depending on what platform provider is using)  6. Inform patient they will receive a phone call 15 minutes prior to their appointment time (may be from unknown caller ID) so they should be prepared to answer    TELEPHONE CALL NOTE  [name] [name] has been deemed a candidate for a follow-up tele-health visit to limit community exposure during the Covid-19 pandemic. I spoke with the patient via phone to ensure availability of phone/video source, confirm preferred email & phone number, and discuss instructions and expectations.  I reminded [name] [name] to be prepared with any vital sign and/or heart rhythm information that could potentially be obtained via home monitoring, at the time of his visit. I reminded [name] [name] to expect a phone call prior to  his visit.  Orinda Kenner 08/09/2018 12:42 PM

## 2018-08-09 NOTE — Patient Instructions (Signed)
S/P M[facility]  Hold coumadin tonight then decrease dose to 1 tablet daily except 1/2 tablet on Sundays, Tuesdays and Thursdays. Recheck in 1 week.

## 2018-08-10 ENCOUNTER — Encounter: Payer: Self-pay | Admitting: Cardiovascular Disease

## 2018-08-10 ENCOUNTER — Telehealth (INDEPENDENT_AMBULATORY_CARE_PROVIDER_SITE_OTHER): Payer: Medicare Other | Admitting: Cardiovascular Disease

## 2018-08-10 VITALS — BP 104/64 | HR 74 | Ht 72.0 in | Wt 293.0 lb

## 2018-08-10 DIAGNOSIS — I471 Supraventricular tachycardia, unspecified: Secondary | ICD-10-CM

## 2018-08-10 DIAGNOSIS — I480 Paroxysmal atrial fibrillation: Secondary | ICD-10-CM

## 2018-08-10 DIAGNOSIS — I38 Endocarditis, valve unspecified: Secondary | ICD-10-CM

## 2018-08-10 DIAGNOSIS — Z5181 Encounter for therapeutic drug level monitoring: Secondary | ICD-10-CM

## 2018-08-10 DIAGNOSIS — I5022 Chronic systolic (congestive) heart failure: Secondary | ICD-10-CM

## 2018-08-10 DIAGNOSIS — I429 Cardiomyopathy, unspecified: Secondary | ICD-10-CM

## 2018-08-10 DIAGNOSIS — Z992 Dependence on renal dialysis: Secondary | ICD-10-CM

## 2018-08-10 DIAGNOSIS — Z954 Presence of other heart-valve replacement: Secondary | ICD-10-CM

## 2018-08-10 DIAGNOSIS — I25708 Atherosclerosis of coronary artery bypass graft(s), unspecified, with other forms of angina pectoris: Secondary | ICD-10-CM

## 2018-08-10 DIAGNOSIS — N186 End stage renal disease: Secondary | ICD-10-CM

## 2018-08-10 NOTE — Progress Notes (Signed)
Virtual Visit via Telephone Note   This visit type was conducted due to national recommendations for restrictions regarding the COVID-19 Pandemic (e.g. social distancing) in an effort to limit this patient's exposure and mitigate transmission in our community.  Due to his co-morbid illnesses, this patient is at least at moderate risk for complications without adequate follow up.  This format is felt to be most appropriate for this patient at this time.  The patient did not have access to video technology/had technical difficulties with video requiring transitioning to audio format only (telephone).  All issues noted in this document were discussed and addressed.  No physical exam could be performed with this format.  Please refer to the patient's chart for his  consent to telehealth for [facility].   Evaluation Performed:  Follow-up visit  Date:  08/10/2018   ID:  [name] [name], DOB 1966/03/19, MRN [id number]  Patient Location: Home Provider Location: Office  PCP:  Patient, No Pcp Per  Cardiologist:  Kate Sable, MD  Electrophysiologist:  Cristopher Peru, MD   Chief Complaint:  AVR  History of Present Illness:    [name] [name] is a 53 y.o. male with a history of CABG, paroxysmal atrial fibrillation, chronic systolic heart failure, mechanical aortic valve replacement and end-stage renal disease for which he is on hemodialysis.  He was recently hospitalized for a left perinephric hematoma.  He received 1 unit packed red blood cell transfusion.  Upon discharge both amiodarone and warfarin were resumed.  INR 3.8 yesterday.  Hemoglobin 7.9 on 08/07/2018.  He underwent removal of a subcutaneous ICD which had developed a pocket infection in October 2019.  It was initially implanted in August 2019 but he developed a severe pocket hematoma requiring evacuation.  Echocardiogram on 10/25/2017 showed EF 30% with moderate left ventricular dilatation, mildly decreased right ventricular systolic  function, normal mechanical aortic valve function, and mild mitral regurgitation.  He underwent mechanical bileaflet aortic valve replacement and single-vessel CABG on 09/01/2017.  He has a multifactorial cardiomyopathy.   Other than some mild left-sided back soreness, he feels well and denies chest pain, palpitations, and shortness of breath.  He denies orthopnea and leg swelling.   The patient does not have symptoms concerning for COVID-19 infection (fever, chills, cough, or new shortness of breath).    Past Medical History:  Diagnosis Date  . Aneurysm ([place])     Right arm fistula 3 aneurysms 2011,   plans to have a new procedure  . Angina   . Chest discomfort   . CHF (congestive heart failure) ([place])   . Coronary artery disease   . Coronary artery disease involving native coronary artery of native heart without angina pectoris   . Dialysis patient ([place])   . Dysrhythmia   . ESRD (end stage renal disease) ([place])    on hemodialysis T_T_S  . Hypertension   . Leg pain   . Mitral regurgitation   . Morbid obesity ([place])   . Orthostatic hypotension   . Overweight(278.02)   . Renal insufficiency   . 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 Severe aortic insufficiency   . Sinus tachycardia   . Syncope     positional after dialysis... 2008   Past Surgical History:  Procedure Laterality Date  . A/V FISTULAGRAM Left 06/30/2016   Procedure: A/V Fistulagram;  Surgeon: Algernon Huxley, MD;  Location: [place] [facility];  Service: Cardiovascular;  Laterality: Left;  . AORTIC VALVE REPLACEMENT N/A 09/01/2017   Procedure: AORTIC VALVE REPLACEMENT (AVR);  Surgeon: [name], MD;  Location: [place];  Service: Open Heart Surgery;  Laterality: N/A;  . ARTERIOVENOUS GRAFT PLACEMENT    . AV FISTULA PLACEMENT    . AV FISTULA PLACEMENT Left 11/18/2015    Procedure: ARTERIOVENOUS (AV) FISTULA CREATION ( RADIOCEPHALIC );  Surgeon: Algernon Huxley, MD;  Location: [facility];  Service: Vascular;  Laterality: Left;  . AV FISTULA PLACEMENT Left 03/23/2016   Procedure: ARTERIOVENOUS (AV) FISTULA CREATION ( REVISION );  Surgeon: Algernon Huxley, MD;  Location: [facility];  Service: Vascular;  Laterality: Left;  . [place] REMOVAL Left 03/23/2016   Procedure: REMOVAL OF ARTERIOVENOUS GORETEX GRAFT ([place]);  Surgeon: Algernon Huxley, MD;  Location: [facility];  Service: Vascular;  Laterality: Left;  . CENTRAL LINE INSERTION Right 09/01/2017   Procedure: FLOROSCOPY GUIDED PLACEMENT OF RIGHT FEMORAL CENTRAL LINE TIMES TWO  AND PLACEMENT OF SWAN GANZ CATHETER;  Surgeon: [name], MD;  Location: [place];  Service: Open Heart Surgery;  Laterality: Right;  . CORONARY ARTERY BYPASS GRAFT N/A 09/01/2017   Procedure: CORONARY ARTERY BYPASS GRAFTING (CABG) X 1, USING RIGHT GREATER SAPHENOUS VEIN HARVESTED ENDOSCOPICALLY;  Surgeon: [name], MD;  Location: [place];  Service: Open Heart Surgery;  Laterality: N/A;  . DIALYSIS/PERMA CATHETER REMOVAL N/A 08/01/2016   Procedure: Dialysis/Perma Catheter Removal;  Surgeon: Algernon Huxley, MD;  Location: [place] [facility];  Service: Cardiovascular;  Laterality: N/A;  . HERNIA REPAIR    . INSERTION OF DIALYSIS CATHETER  03/01/2011   Procedure: INSERTION OF DIALYSIS CATHETER;  Surgeon: Elam Dutch, MD;  Location: [place];  Service: Vascular;  Laterality: Left;  Exchange of Dialysis Catheter to 27cm 15Fr. Arrow Catheter  . INSERTION OF DIALYSIS CATHETER  09/01/2017   Procedure: PLACEMENT OF TRIALYSIS SHORT TERM DIALYSIS CATHETER;  Surgeon: [name], MD;  Location: [place] [facility];  Service: Open Heart Surgery;;  . MULTIPLE EXTRACTIONS WITH ALVEOLOPLASTY N/A 08/07/2017   Procedure: Extraction of tooth #2, 7,8,13,14,15,17, and 31 with alveoloplasty and gross debridement of remaining teeth;  Surgeon: [name], DDS;  Location: [place] [place];   Service: Oral Surgery;  Laterality: N/A;  . PERIPHERAL VASCULAR CATHETERIZATION N/A 09/01/2014   Procedure: A/V Shuntogram/Fistulagram;  Surgeon: Algernon Huxley, MD;  Location: [place] [facility];  Service: Cardiovascular;  Laterality: N/A;  . PERIPHERAL VASCULAR CATHETERIZATION Right 09/01/2014   Procedure: Thrombectomy;  Surgeon: Algernon Huxley, MD;  Location: [place] [facility];  Service: Cardiovascular;  Laterality: Right;  . PERIPHERAL VASCULAR CATHETERIZATION Right 09/01/2014   Procedure: A/V Shunt Intervention;  Surgeon: Algernon Huxley, MD;  Location: [place] [facility];  Service: Cardiovascular;  Laterality: Right;  . PERIPHERAL VASCULAR CATHETERIZATION N/A 09/17/2014   Procedure: A/V Shuntogram/Fistulagram;  Surgeon: Algernon Huxley, MD;  Location: [place] [facility];  Service: Cardiovascular;  Laterality: N/A;  . PERIPHERAL VASCULAR CATHETERIZATION Right 10/17/2014   Procedure: Thrombectomy;  Surgeon: Katha Cabal, MD;  Location: [place] [facility];  Service: Cardiovascular;  Laterality: Right;  . PERIPHERAL VASCULAR CATHETERIZATION N/A 10/17/2014   Procedure: A/V Shuntogram/Fistulagram;  Surgeon: Katha Cabal, MD;  Location: [place] [facility];  Service: Cardiovascular;  Laterality: N/A;  . PERIPHERAL VASCULAR CATHETERIZATION N/A 10/17/2014   Procedure: A/V Shunt Intervention;  Surgeon: Dolores Lory  Schnier, MD;  Location: [place] [facility];  Service: Cardiovascular;  Laterality: N/A;  . PERIPHERAL VASCULAR CATHETERIZATION Right 11/04/2014   Procedure: Thrombectomy;  Surgeon: Katha Cabal, MD;  Location: [place] [facility];  Service: Cardiovascular;  Laterality: Right;  . PERIPHERAL VASCULAR CATHETERIZATION Left 11/04/2014   Procedure: Visceral Venography;  Surgeon: Katha Cabal, MD;  Location: [place] [facility];  Service: Cardiovascular;  Laterality: Left;  . PERIPHERAL VASCULAR CATHETERIZATION Right 11/27/2014   Procedure: A/V Shuntogram/Fistulagram;  Surgeon: Algernon Huxley,  MD;  Location: [place] [facility];  Service: Cardiovascular;  Laterality: Right;  . PERIPHERAL VASCULAR CATHETERIZATION Right 11/27/2014   Procedure: A/V Shunt Intervention;  Surgeon: Algernon Huxley, MD;  Location: [place] [facility];  Service: Cardiovascular;  Laterality: Right;  . PERIPHERAL VASCULAR CATHETERIZATION Right 12/31/2014   Procedure: Thrombectomy;  Surgeon: Algernon Huxley, MD;  Location: [place] [facility];  Service: Cardiovascular;  Laterality: Right;  . PERIPHERAL VASCULAR CATHETERIZATION Right 01/12/2015   Procedure: A/V Shuntogram/Fistulagram;  Surgeon: Algernon Huxley, MD;  Location: [place] [facility];  Service: Cardiovascular;  Laterality: Right;  . PERIPHERAL VASCULAR CATHETERIZATION N/A 01/12/2015   Procedure: A/V Shunt Intervention;  Surgeon: Algernon Huxley, MD;  Location: [place] [facility];  Service: Cardiovascular;  Laterality: N/A;  . PERIPHERAL VASCULAR CATHETERIZATION Right 01/28/2015   Procedure: Thrombectomy;  Surgeon: Algernon Huxley, MD;  Location: [place] [facility];  Service: Cardiovascular;  Laterality: Right;  . PERIPHERAL VASCULAR CATHETERIZATION N/A 06/08/2015   Procedure: A/V Shuntogram/Fistulagram;  Surgeon: Algernon Huxley, MD;  Location: [place] [facility];  Service: Cardiovascular;  Laterality: N/A;  . PERIPHERAL VASCULAR CATHETERIZATION N/A 06/08/2015   Procedure: A/V Shunt Intervention;  Surgeon: Algernon Huxley, MD;  Location: [place] [facility];  Service: Cardiovascular;  Laterality: N/A;  . PERIPHERAL VASCULAR CATHETERIZATION Right 07/06/2015   Procedure: A/V Shuntogram/Fistulagram;  Surgeon: Algernon Huxley, MD;  Location: [place] [facility];  Service: Cardiovascular;  Laterality: Right;  . PERIPHERAL VASCULAR CATHETERIZATION N/A 07/06/2015   Procedure: A/V Shunt Intervention;  Surgeon: Algernon Huxley, MD;  Location: [place] [facility];  Service: Cardiovascular;  Laterality: N/A;  . PERIPHERAL VASCULAR CATHETERIZATION N/A 08/04/2015   Procedure: graft declot;   Surgeon: Katha Cabal, MD;  Location: [place] [facility];  Service: Cardiovascular;  Laterality: N/A;  . PERIPHERAL VASCULAR CATHETERIZATION Right 08/25/2015   Procedure: A/V Shuntogram/Fistulagram;  Surgeon: Katha Cabal, MD;  Location: [place] [facility];  Service: Cardiovascular;  Laterality: Right;  . PERIPHERAL VASCULAR CATHETERIZATION N/A 11/04/2015   Procedure: Dialysis/Perma Catheter Insertion;  Surgeon: Algernon Huxley, MD;  Location: [place] [facility];  Service: Cardiovascular;  Laterality: N/A;  . PERIPHERAL VASCULAR CATHETERIZATION Left 12/14/2015   Procedure: A/V Shuntogram/Fistulagram;  Surgeon: Algernon Huxley, MD;  Location: [place] [facility];  Service: Cardiovascular;  Laterality: Left;  . PERIPHERAL VASCULAR CATHETERIZATION N/A 12/14/2015   Procedure: A/V Shunt Intervention;  Surgeon: Algernon Huxley, MD;  Location: [place] [facility];  Service: Cardiovascular;  Laterality: N/A;  . PERIPHERAL VASCULAR CATHETERIZATION N/A 12/17/2015   Procedure: Dialysis/Perma Catheter Insertion;  Surgeon: Algernon Huxley, MD;  Location: [place] [facility];  Service: Cardiovascular;  Laterality: N/A;  . PERIPHERAL VASCULAR CATHETERIZATION Left 12/22/2015   Procedure: Dialysis/Perma Catheter Insertion;  Surgeon: Katha Cabal, MD;  Location: [place] [facility];  Service: Cardiovascular;  Laterality: Left;  . PERIPHERAL VASCULAR CATHETERIZATION Left 02/29/2016  Procedure: A/V Shuntogram/Fistulagram;  Surgeon: Algernon Huxley, MD;  Location: [place] [facility];  Service: Cardiovascular;  Laterality: Left;  . PERIPHERAL VASCULAR CATHETERIZATION N/A 02/29/2016   Procedure: A/V Shunt Intervention;  Surgeon: Algernon Huxley, MD;  Location: [place] [facility];  Service: Cardiovascular;  Laterality: N/A;  . POCKET REVISION/RELOCATION N/A 01/01/2018   Procedure: POCKET REVISION/RELOCATION;  Surgeon: Evans Lance, MD;  Location: [place] [facility];  Service: Cardiovascular;  Laterality: N/A;  . right  arm graft     for dyalisis  . RIGHT/LEFT HEART CATH AND CORONARY ANGIOGRAPHY N/A 08/02/2017   Procedure: RIGHT/LEFT HEART CATH AND CORONARY ANGIOGRAPHY;  Surgeon: Larey Dresser, MD;  Location: [place] [facility];  Service: Cardiovascular;  Laterality: N/A;  . SUBQ ICD IMPLANT N/A 11/28/2017   Procedure: SUBQ ICD IMPLANT;  Surgeon: Evans Lance, MD;  Location: [place] [facility];  Service: Cardiovascular;  Laterality: N/A;  . SUBQ ICD REVISION N/A 02/12/2018   Procedure: SUBQ ICD REVISION(REMOVAL);  Surgeon: Evans Lance, MD;  Location: [place] [facility];  Service: Cardiovascular;  Laterality: N/A;  . TEE WITHOUT CARDIOVERSION N/A 07/31/2017   Procedure: TRANSESOPHAGEAL ECHOCARDIOGRAM (TEE);  Surgeon: Josue Hector, MD;  [facility];  Service: Cardiovascular;  Laterality: N/A;  . TEE WITHOUT CARDIOVERSION N/A 09/01/2017   Procedure: TRANSESOPHAGEAL ECHOCARDIOGRAM (TEE);  Surgeon: [name], MD;  Location: [place];  Service: Open Heart Surgery;  Laterality: N/A;  . THROMBECTOMY    . THROMBECTOMY W/ EMBOLECTOMY  03/01/2011   Procedure: THROMBECTOMY ARTERIOVENOUS GORE-TEX GRAFT;  Surgeon: Elam Dutch, MD;  Location: [place];  Service: Vascular;  Laterality: Right;  Attempted Thrombectomy of Old  Right Upper Arm Arteriovenous gortex Graft. Insertion of new Arteriovenous Graft using 78mm x 50cm Gortex Stretch graft.   . THROMBECTOMY W/ EMBOLECTOMY  07/11/2011   Procedure: THROMBECTOMY ARTERIOVENOUS GORE-TEX GRAFT;  Surgeon: Rosetta Posner, MD;  Location: [place];  Service: Vascular;  Laterality: Right;  . UMBILICAL HERNIA REPAIR    . VENOGRAM N/A 08/23/2011   Procedure: VENOGRAM;  Surgeon: [name], MD;  Location: [facility];  Service: Cardiovascular;  Laterality: N/A;     Current Meds  Medication Sig  . amiodarone (PACERONE) 200 MG tablet Take 1 tablet (200 mg total) by mouth daily.  [name] aspirin EC 81 MG EC tablet Take 1 tablet (81 mg total) by mouth daily.  [name]  atorvastatin (LIPITOR) 80 MG tablet Take 1 tablet (80 mg total) by mouth daily at 6 PM.  . B Complex-C-Folic Acid (NEPHRO-VITE PO) Take 1 tablet by mouth daily.   [name] FOSRENOL 500 MG chewable tablet Chew 1,500-2,000 mg by mouth See admin instructions. Take 2000 mg by mouth with meals and take 1500 mg by mouth with snacks  . midodrine (PROAMATINE) 5 MG tablet Take 3 tablets (15 mg total) by mouth 3 (three) times daily with meals.  . warfarin (COUMADIN) 5 MG tablet Take 1 tablet daily except 1/2 tablet on Wednesdays and Sundays or as directed by coumadin clinic     Allergies:   Patient has no known allergies.   Social History   Tobacco Use  . Smoking status: Former Smoker    Types: Cigarettes    Last attempt to quit: 04/18/1994    Years since quitting: 24.3  . Smokeless tobacco: Current User    Types: Snuff  . Tobacco comment: dips 1/2 snuff per day times 25 years  Substance Use Topics  .  Alcohol use: No  . Drug use: No     Family Hx: The patient's family history includes Heart disease in his father and mother; Hypertension in his father and mother.  ROS:   Please see the history of present illness.     All other systems reviewed and are negative.   Prior CV studies:   The following studies were reviewed today:  Echo reviewed above  Labs/Other Tests and Data Reviewed:    EKG: ECGs from 4/19 through 4/20 were personally reviewed.  Recent Labs: 10/25/2017: TSH 1.819 08/01/2018: ALT 12; B Natriuretic Peptide 155.8 08/06/2018: BUN 40; Creatinine, Ser 8.36; Magnesium 2.0; Potassium 4.0; Sodium 132 08/07/2018: Hemoglobin 7.9; Platelets 380   Recent Lipid Panel Lab Results  Component Value Date/Time   CHOL 180 10/25/2017 11:14 AM   CHOL 169 05/16/2013 06:16 AM   TRIG 89 10/25/2017 11:14 AM   TRIG 97 05/16/2013 06:16 AM   HDL 44 10/25/2017 11:14 AM   HDL 35 (L) 05/16/2013 06:16 AM   CHOLHDL 4.1 10/25/2017 11:14 AM   LDLCALC 118 (H) 10/25/2017 11:14 AM   LDLCALC 115 (H)  05/16/2013 06:16 AM    Wt Readings from Last 3 Encounters:  08/10/18 293 lb (132.9 kg)  08/07/18 296 lb 11.8 oz (134.6 kg)  07/23/18 295 lb (133.8 kg)     Objective:    Vital Signs:  BP 104/64   Pulse 74   Ht 6' (1.829 m)   Wt 293 lb (132.9 kg)   BMI 39.74 kg/m     VITAL SIGNS:  reviewed  ASSESSMENT & PLAN:    1. Chronic systolic heart failure/cardiomyopathy: Symptomatically stable.Multifactorial in etiology. Volume management as per hemodialysis. Due to complications from ICD implant including severe hematoma and pocket infection, it was removed in October 2019. Continue Lopressor 25 mg twice daily.  2. Coronary artery disease status post one-vessel CABG: Symptomatically stable. Continue Lopressor and atorvastatin 80 mg.  I will stop aspirin given recent perinephric hematoma and concomitant use of warfarin.  3. Paroxysmal atrial fibrillation: Symptomatically stable.  Currently on oral amiodarone and metoprolol and systemically anticoagulated with warfarin.He will need routine TSH and LFT monitoring as well as lung function testing and eye exams.  Liver transaminases were normal on 08/01/2018.  4. Status post mechanical bileaflet aortic valve replacement: Currently on warfarin.Aortic valve was functioning normally by echo on 10/25/2017. Continue to keep INR between 2-3.He will need antibiotic prophylaxis with dental work.  5. End-stage renal disease on hemodialysis. As hebecomes hypotensive, he is currently on midodrine.  6. Paroxysmal supraventricular tachycardia:Symptomatically improved with the addition of metoprolol tartrate 25 mg twice daily on 09/26/2017. He isalsoon amiodarone. Ipreviously madean EP referral to consider an ablation as this appears to be recurrent and rapid.  Followed by Dr. Lovena Le.  7. Valvular heart disease: TEE on 09/01/2017 demonstrated severe aortic valve regurgitation for which he underwent aortic valve replacement.  He also had moderate leaflet thickening and calcification with mild mitral annular calcification and moderate mitral regurgitation.Follow-up echocardiogram on 10/25/2017 showed a normally functioning aortic valve.    COVID-19 Education: The signs and symptoms of COVID-19 were discussed with the patient and how to seek care for testing (follow up with PCP or arrange E-visit).  The importance of social distancing was discussed today.  Time:   Today, I have spent 25 minutes with the patient with telehealth technology discussing the above problems.     Medication Adjustments/Labs and Tests Ordered: Current medicines are reviewed at length with the  patient today.  Concerns regarding medicines are outlined above.   Tests Ordered: No orders of the defined types were placed in this encounter.   Medication Changes: No orders of the defined types were placed in this encounter.   Disposition:  Follow up in 6 month(s)  Signed, Kate Sable, MD  08/10/2018 12:43 PM    [place]

## 2018-08-10 NOTE — Addendum Note (Signed)
Addended by: Barbarann Ehlers A on: 08/10/2018 01:23 PM   Modules accepted: Orders

## 2018-08-10 NOTE — Patient Instructions (Signed)
Medication Instructions: STOP ASPIRIN  Labwork: None today  Procedures/Testing: None today  Follow-Up: 6 months with Dr.Koneswaran  Any Additional Special Instructions Will Be Listed Below (If Applicable).     If you need a refill on your cardiac medications before your next appointment, please call your pharmacy.     Thank you for choosing [facility] !

## 2018-08-11 DIAGNOSIS — Z992 Dependence on renal dialysis: Secondary | ICD-10-CM | POA: Diagnosis not present

## 2018-08-11 DIAGNOSIS — N186 End stage renal disease: Secondary | ICD-10-CM | POA: Diagnosis not present

## 2018-08-11 DIAGNOSIS — D509 Iron deficiency anemia, unspecified: Secondary | ICD-10-CM | POA: Diagnosis not present

## 2018-08-11 DIAGNOSIS — E611 Iron deficiency: Secondary | ICD-10-CM | POA: Diagnosis not present

## 2018-08-11 DIAGNOSIS — N2581 Secondary hyperparathyroidism of renal origin: Secondary | ICD-10-CM | POA: Diagnosis not present

## 2018-08-14 ENCOUNTER — Telehealth: Payer: Self-pay | Admitting: *Deleted

## 2018-08-14 DIAGNOSIS — Z992 Dependence on renal dialysis: Secondary | ICD-10-CM | POA: Diagnosis not present

## 2018-08-14 DIAGNOSIS — E611 Iron deficiency: Secondary | ICD-10-CM | POA: Diagnosis not present

## 2018-08-14 DIAGNOSIS — D509 Iron deficiency anemia, unspecified: Secondary | ICD-10-CM | POA: Diagnosis not present

## 2018-08-14 DIAGNOSIS — N2581 Secondary hyperparathyroidism of renal origin: Secondary | ICD-10-CM | POA: Diagnosis not present

## 2018-08-14 DIAGNOSIS — N186 End stage renal disease: Secondary | ICD-10-CM | POA: Diagnosis not present

## 2018-08-14 NOTE — Telephone Encounter (Signed)

## 2018-08-16 DIAGNOSIS — N2581 Secondary hyperparathyroidism of renal origin: Secondary | ICD-10-CM | POA: Diagnosis not present

## 2018-08-16 DIAGNOSIS — D509 Iron deficiency anemia, unspecified: Secondary | ICD-10-CM | POA: Diagnosis not present

## 2018-08-16 DIAGNOSIS — N186 End stage renal disease: Secondary | ICD-10-CM | POA: Diagnosis not present

## 2018-08-16 DIAGNOSIS — E611 Iron deficiency: Secondary | ICD-10-CM | POA: Diagnosis not present

## 2018-08-16 DIAGNOSIS — Z992 Dependence on renal dialysis: Secondary | ICD-10-CM | POA: Diagnosis not present

## 2018-08-19 ENCOUNTER — Inpatient Hospital Stay (HOSPITAL_COMMUNITY)
Admission: EM | Admit: 2018-08-19 | Discharge: 2018-09-04 | DRG: 673 | Disposition: A | Discharge status: Rehabilitation Facility | Payer: Medicare Other | Source: Other Acute Inpatient Hospital | Attending: Student in an Organized Health Care Education/Training Program | Admitting: Student in an Organized Health Care Education/Training Program

## 2018-08-19 ENCOUNTER — Emergency Department: Payer: Self-pay

## 2018-08-19 ENCOUNTER — Other Ambulatory Visit: Payer: Self-pay

## 2018-08-19 ENCOUNTER — Encounter (HOSPITAL_COMMUNITY): Payer: Self-pay | Admitting: Emergency Medicine

## 2018-08-19 ENCOUNTER — Emergency Department (HOSPITAL_COMMUNITY): Payer: Medicare Other

## 2018-08-19 DIAGNOSIS — Q613 Polycystic kidney, unspecified: Secondary | ICD-10-CM

## 2018-08-19 DIAGNOSIS — I132 Hypertensive heart and chronic kidney disease with heart failure and with stage 5 chronic kidney disease, or end stage renal disease: Secondary | ICD-10-CM | POA: Diagnosis present

## 2018-08-19 DIAGNOSIS — Q6119 Other polycystic kidney, infantile type: Secondary | ICD-10-CM | POA: Diagnosis not present

## 2018-08-19 DIAGNOSIS — I2510 Atherosclerotic heart disease of native coronary artery without angina pectoris: Secondary | ICD-10-CM | POA: Diagnosis present

## 2018-08-19 DIAGNOSIS — Z79899 Other long term (current) drug therapy: Secondary | ICD-10-CM

## 2018-08-19 DIAGNOSIS — Z1159 Encounter for screening for other viral diseases: Secondary | ICD-10-CM

## 2018-08-19 DIAGNOSIS — E46 Unspecified protein-calorie malnutrition: Secondary | ICD-10-CM | POA: Diagnosis present

## 2018-08-19 DIAGNOSIS — R159 Full incontinence of feces: Secondary | ICD-10-CM | POA: Diagnosis present

## 2018-08-19 DIAGNOSIS — T80818A Extravasation of other vesicant agent, initial encounter: Secondary | ICD-10-CM | POA: Diagnosis not present

## 2018-08-19 DIAGNOSIS — I469 Cardiac arrest, cause unspecified: Secondary | ICD-10-CM | POA: Diagnosis not present

## 2018-08-19 DIAGNOSIS — Z7901 Long term (current) use of anticoagulants: Secondary | ICD-10-CM | POA: Diagnosis not present

## 2018-08-19 DIAGNOSIS — R34 Anuria and oliguria: Secondary | ICD-10-CM | POA: Diagnosis present

## 2018-08-19 DIAGNOSIS — K8020 Calculus of gallbladder without cholecystitis without obstruction: Secondary | ICD-10-CM | POA: Diagnosis not present

## 2018-08-19 DIAGNOSIS — Z8249 Family history of ischemic heart disease and other diseases of the circulatory system: Secondary | ICD-10-CM | POA: Diagnosis not present

## 2018-08-19 DIAGNOSIS — R5381 Other malaise: Secondary | ICD-10-CM | POA: Diagnosis not present

## 2018-08-19 DIAGNOSIS — R1032 Left lower quadrant pain: Secondary | ICD-10-CM | POA: Diagnosis not present

## 2018-08-19 DIAGNOSIS — N186 End stage renal disease: Secondary | ICD-10-CM | POA: Diagnosis not present

## 2018-08-19 DIAGNOSIS — D6832 Hemorrhagic disorder due to extrinsic circulating anticoagulants: Secondary | ICD-10-CM | POA: Diagnosis present

## 2018-08-19 DIAGNOSIS — E43 Unspecified severe protein-calorie malnutrition: Secondary | ICD-10-CM | POA: Diagnosis not present

## 2018-08-19 DIAGNOSIS — R791 Abnormal coagulation profile: Secondary | ICD-10-CM | POA: Diagnosis present

## 2018-08-19 DIAGNOSIS — I959 Hypotension, unspecified: Secondary | ICD-10-CM | POA: Diagnosis not present

## 2018-08-19 DIAGNOSIS — E785 Hyperlipidemia, unspecified: Secondary | ICD-10-CM | POA: Diagnosis present

## 2018-08-19 DIAGNOSIS — R718 Other abnormality of red blood cells: Secondary | ICD-10-CM | POA: Diagnosis not present

## 2018-08-19 DIAGNOSIS — I951 Orthostatic hypotension: Secondary | ICD-10-CM | POA: Diagnosis present

## 2018-08-19 DIAGNOSIS — Z951 Presence of aortocoronary bypass graft: Secondary | ICD-10-CM | POA: Diagnosis not present

## 2018-08-19 DIAGNOSIS — I120 Hypertensive chronic kidney disease with stage 5 chronic kidney disease or end stage renal disease: Secondary | ICD-10-CM | POA: Diagnosis not present

## 2018-08-19 DIAGNOSIS — K5730 Diverticulosis of large intestine without perforation or abscess without bleeding: Secondary | ICD-10-CM | POA: Diagnosis not present

## 2018-08-19 DIAGNOSIS — K5790 Diverticulosis of intestine, part unspecified, without perforation or abscess without bleeding: Secondary | ICD-10-CM | POA: Diagnosis not present

## 2018-08-19 DIAGNOSIS — Z992 Dependence on renal dialysis: Secondary | ICD-10-CM

## 2018-08-19 DIAGNOSIS — D638 Anemia in other chronic diseases classified elsewhere: Secondary | ICD-10-CM | POA: Diagnosis not present

## 2018-08-19 DIAGNOSIS — T45515A Adverse effect of anticoagulants, initial encounter: Secondary | ICD-10-CM | POA: Diagnosis present

## 2018-08-19 DIAGNOSIS — I5042 Chronic combined systolic (congestive) and diastolic (congestive) heart failure: Secondary | ICD-10-CM | POA: Diagnosis present

## 2018-08-19 DIAGNOSIS — I9589 Other hypotension: Secondary | ICD-10-CM | POA: Diagnosis present

## 2018-08-19 DIAGNOSIS — I462 Cardiac arrest due to underlying cardiac condition: Secondary | ICD-10-CM | POA: Diagnosis not present

## 2018-08-19 DIAGNOSIS — R0989 Other specified symptoms and signs involving the circulatory and respiratory systems: Secondary | ICD-10-CM | POA: Diagnosis not present

## 2018-08-19 DIAGNOSIS — R310 Gross hematuria: Secondary | ICD-10-CM

## 2018-08-19 DIAGNOSIS — D72829 Elevated white blood cell count, unspecified: Secondary | ICD-10-CM | POA: Diagnosis present

## 2018-08-19 DIAGNOSIS — I953 Hypotension of hemodialysis: Secondary | ICD-10-CM | POA: Diagnosis not present

## 2018-08-19 DIAGNOSIS — R109 Unspecified abdominal pain: Secondary | ICD-10-CM

## 2018-08-19 DIAGNOSIS — R1030 Lower abdominal pain, unspecified: Secondary | ICD-10-CM | POA: Diagnosis not present

## 2018-08-19 DIAGNOSIS — S37019A Minor contusion of unspecified kidney, initial encounter: Secondary | ICD-10-CM | POA: Diagnosis present

## 2018-08-19 DIAGNOSIS — Z952 Presence of prosthetic heart valve: Secondary | ICD-10-CM | POA: Diagnosis not present

## 2018-08-19 DIAGNOSIS — R1084 Generalized abdominal pain: Secondary | ICD-10-CM | POA: Diagnosis not present

## 2018-08-19 DIAGNOSIS — Z95810 Presence of automatic (implantable) cardiac defibrillator: Secondary | ICD-10-CM | POA: Diagnosis not present

## 2018-08-19 DIAGNOSIS — E8889 Other specified metabolic disorders: Secondary | ICD-10-CM | POA: Diagnosis present

## 2018-08-19 DIAGNOSIS — R229 Localized swelling, mass and lump, unspecified: Secondary | ICD-10-CM | POA: Diagnosis not present

## 2018-08-19 DIAGNOSIS — I130 Hypertensive heart and chronic kidney disease with heart failure and stage 1 through stage 4 chronic kidney disease, or unspecified chronic kidney disease: Secondary | ICD-10-CM | POA: Diagnosis not present

## 2018-08-19 DIAGNOSIS — Z515 Encounter for palliative care: Secondary | ICD-10-CM | POA: Diagnosis not present

## 2018-08-19 DIAGNOSIS — Z9889 Other specified postprocedural states: Secondary | ICD-10-CM | POA: Diagnosis not present

## 2018-08-19 DIAGNOSIS — N4889 Other specified disorders of penis: Secondary | ICD-10-CM | POA: Diagnosis not present

## 2018-08-19 DIAGNOSIS — K661 Hemoperitoneum: Secondary | ICD-10-CM | POA: Diagnosis not present

## 2018-08-19 DIAGNOSIS — I4891 Unspecified atrial fibrillation: Secondary | ICD-10-CM | POA: Diagnosis present

## 2018-08-19 DIAGNOSIS — N2889 Other specified disorders of kidney and ureter: Secondary | ICD-10-CM | POA: Diagnosis present

## 2018-08-19 DIAGNOSIS — N2581 Secondary hyperparathyroidism of renal origin: Secondary | ICD-10-CM | POA: Diagnosis present

## 2018-08-19 DIAGNOSIS — Z7189 Other specified counseling: Secondary | ICD-10-CM | POA: Diagnosis not present

## 2018-08-19 DIAGNOSIS — N189 Chronic kidney disease, unspecified: Secondary | ICD-10-CM | POA: Diagnosis not present

## 2018-08-19 DIAGNOSIS — Z954 Presence of other heart-valve replacement: Secondary | ICD-10-CM

## 2018-08-19 DIAGNOSIS — I5020 Unspecified systolic (congestive) heart failure: Secondary | ICD-10-CM | POA: Diagnosis not present

## 2018-08-19 DIAGNOSIS — I509 Heart failure, unspecified: Secondary | ICD-10-CM | POA: Diagnosis not present

## 2018-08-19 DIAGNOSIS — Z720 Tobacco use: Secondary | ICD-10-CM

## 2018-08-19 DIAGNOSIS — D62 Acute posthemorrhagic anemia: Secondary | ICD-10-CM | POA: Diagnosis not present

## 2018-08-19 DIAGNOSIS — R000 Tachycardia, unspecified: Secondary | ICD-10-CM | POA: Diagnosis present

## 2018-08-19 DIAGNOSIS — R570 Cardiogenic shock: Secondary | ICD-10-CM | POA: Diagnosis not present

## 2018-08-19 DIAGNOSIS — R0689 Other abnormalities of breathing: Secondary | ICD-10-CM | POA: Diagnosis not present

## 2018-08-19 DIAGNOSIS — I472 Ventricular tachycardia: Secondary | ICD-10-CM | POA: Diagnosis not present

## 2018-08-19 DIAGNOSIS — N280 Ischemia and infarction of kidney: Secondary | ICD-10-CM | POA: Diagnosis present

## 2018-08-19 DIAGNOSIS — N99520 Hemorrhage of other external stoma of urinary tract: Secondary | ICD-10-CM | POA: Diagnosis not present

## 2018-08-19 DIAGNOSIS — D509 Iron deficiency anemia, unspecified: Secondary | ICD-10-CM | POA: Diagnosis present

## 2018-08-19 DIAGNOSIS — R0682 Tachypnea, not elsewhere classified: Secondary | ICD-10-CM | POA: Diagnosis present

## 2018-08-19 DIAGNOSIS — R5383 Other fatigue: Secondary | ICD-10-CM | POA: Diagnosis not present

## 2018-08-19 DIAGNOSIS — R58 Hemorrhage, not elsewhere classified: Secondary | ICD-10-CM | POA: Diagnosis not present

## 2018-08-19 DIAGNOSIS — L89893 Pressure ulcer of other site, stage 3: Secondary | ICD-10-CM | POA: Diagnosis not present

## 2018-08-19 DIAGNOSIS — Z87891 Personal history of nicotine dependence: Secondary | ICD-10-CM | POA: Diagnosis not present

## 2018-08-19 DIAGNOSIS — D631 Anemia in chronic kidney disease: Secondary | ICD-10-CM | POA: Diagnosis present

## 2018-08-19 DIAGNOSIS — G9340 Encephalopathy, unspecified: Secondary | ICD-10-CM | POA: Diagnosis not present

## 2018-08-19 DIAGNOSIS — X58XXXD Exposure to other specified factors, subsequent encounter: Secondary | ICD-10-CM | POA: Diagnosis present

## 2018-08-19 DIAGNOSIS — R1012 Left upper quadrant pain: Secondary | ICD-10-CM | POA: Diagnosis not present

## 2018-08-19 DIAGNOSIS — S37012A Minor contusion of left kidney, initial encounter: Secondary | ICD-10-CM | POA: Diagnosis present

## 2018-08-19 DIAGNOSIS — J9600 Acute respiratory failure, unspecified whether with hypoxia or hypercapnia: Secondary | ICD-10-CM | POA: Diagnosis not present

## 2018-08-19 DIAGNOSIS — X58XXXA Exposure to other specified factors, initial encounter: Secondary | ICD-10-CM | POA: Diagnosis not present

## 2018-08-19 DIAGNOSIS — Z6837 Body mass index (BMI) 37.0-37.9, adult: Secondary | ICD-10-CM | POA: Diagnosis not present

## 2018-08-19 DIAGNOSIS — I872 Venous insufficiency (chronic) (peripheral): Secondary | ICD-10-CM | POA: Diagnosis not present

## 2018-08-19 DIAGNOSIS — Z789 Other specified health status: Secondary | ICD-10-CM

## 2018-08-19 DIAGNOSIS — K921 Melena: Secondary | ICD-10-CM | POA: Diagnosis not present

## 2018-08-19 DIAGNOSIS — F439 Reaction to severe stress, unspecified: Secondary | ICD-10-CM | POA: Diagnosis present

## 2018-08-19 DIAGNOSIS — S37092A Other injury of left kidney, initial encounter: Secondary | ICD-10-CM | POA: Diagnosis not present

## 2018-08-19 DIAGNOSIS — I700 Atherosclerosis of aorta: Secondary | ICD-10-CM | POA: Diagnosis not present

## 2018-08-19 DIAGNOSIS — I5022 Chronic systolic (congestive) heart failure: Secondary | ICD-10-CM | POA: Diagnosis present

## 2018-08-19 LAB — COMPREHENSIVE METABOLIC PANEL
ALT: 23 U/L (ref 0–44)
AST: 49 U/L — ABNORMAL HIGH (ref 15–41)
Albumin: 2.1 g/dL — ABNORMAL LOW (ref 3.5–5.0)
Alkaline Phosphatase: 196 U/L — ABNORMAL HIGH (ref 38–126)
Anion gap: 16 — ABNORMAL HIGH (ref 5–15)
BUN: 61 mg/dL — ABNORMAL HIGH (ref 6–20)
CO2: 21 mmol/L — ABNORMAL LOW (ref 22–32)
Calcium: 8.3 mg/dL — ABNORMAL LOW (ref 8.9–10.3)
Chloride: 102 mmol/L (ref 98–111)
Creatinine, Ser: 10.6 mg/dL — ABNORMAL HIGH (ref 0.61–1.24)
GFR calc Af Amer: 6 mL/min — ABNORMAL LOW (ref >60)
GFR calc non Af Amer: 5 mL/min — ABNORMAL LOW (ref >60)
Glucose, Bld: 101 mg/dL — ABNORMAL HIGH (ref 70–99)
Potassium: 4.4 mmol/L (ref 3.5–5.1)
Sodium: 139 mmol/L (ref 135–145)
Total Bilirubin: 1.3 mg/dL — ABNORMAL HIGH (ref 0.3–1.2)
Total Protein: 7.4 g/dL (ref 6.5–8.1)

## 2018-08-19 LAB — CBC WITH DIFFERENTIAL/PLATELET
Abs Immature Granulocytes: 0.11 10*3/uL — ABNORMAL HIGH (ref 0.00–0.07)
Basophils Absolute: 0.1 10*3/uL (ref 0.0–0.1)
Basophils Relative: 0 %
Eosinophils Absolute: 0.1 10*3/uL (ref 0.0–0.5)
Eosinophils Relative: 0 %
HCT: 25.5 % — ABNORMAL LOW (ref 39.0–52.0)
Hemoglobin: 7.6 g/dL — ABNORMAL LOW (ref 13.0–17.0)
Immature Granulocytes: 1 %
Lymphocytes Relative: 3 %
Lymphs Abs: 0.6 10*3/uL — ABNORMAL LOW (ref 0.7–4.0)
MCH: 25.7 pg — ABNORMAL LOW (ref 26.0–34.0)
MCHC: 29.8 g/dL — ABNORMAL LOW (ref 30.0–36.0)
MCV: 86.1 fL (ref 80.0–100.0)
Monocytes Absolute: 1.2 10*3/uL — ABNORMAL HIGH (ref 0.1–1.0)
Monocytes Relative: 6 %
Neutro Abs: 17.1 10*3/uL — ABNORMAL HIGH (ref 1.7–7.7)
Neutrophils Relative %: 90 %
Platelets: 636 10*3/uL — ABNORMAL HIGH (ref 150–400)
RBC: 2.96 MIL/uL — ABNORMAL LOW (ref 4.22–5.81)
RDW: 16.7 % — ABNORMAL HIGH (ref 11.5–15.5)
WBC: 19.1 10*3/uL — ABNORMAL HIGH (ref 4.0–10.5)
nRBC: 0 % (ref 0.0–0.2)

## 2018-08-19 LAB — CBC
HCT: 22.5 % — ABNORMAL LOW (ref 39.0–52.0)
Hemoglobin: 6.9 g/dL — CL (ref 13.0–17.0)
MCH: 25.2 pg — ABNORMAL LOW (ref 26.0–34.0)
MCHC: 30.7 g/dL (ref 30.0–36.0)
MCV: 82.1 fL (ref 80.0–100.0)
Platelets: 411 10*3/uL — ABNORMAL HIGH (ref 150–400)
RBC: 2.74 MIL/uL — ABNORMAL LOW (ref 4.22–5.81)
RDW: 16.6 % — ABNORMAL HIGH (ref 11.5–15.5)
WBC: 17.9 10*3/uL — ABNORMAL HIGH (ref 4.0–10.5)
nRBC: 0 % (ref 0.0–0.2)

## 2018-08-19 LAB — PROTIME-INR
INR: 5.5 (ref 0.8–1.2)
Prothrombin Time: 49.1 seconds — ABNORMAL HIGH (ref 11.4–15.2)

## 2018-08-19 LAB — APTT: aPTT: 73 seconds — ABNORMAL HIGH (ref 24–36)

## 2018-08-19 LAB — SARS CORONAVIRUS 2 BY RT PCR (HOSPITAL ORDER, PERFORMED IN ~~LOC~~ HOSPITAL LAB): SARS Coronavirus 2: NEGATIVE

## 2018-08-19 MED ORDER — LANTHANUM CARBONATE 500 MG PO CHEW
1000.0000 mg | CHEWABLE_TABLET | Freq: Three times a day (TID) | ORAL | Status: DC
  Administered 2018-08-21 – 2018-08-27 (×4): 1000 mg via ORAL
  Filled 2018-08-19 (×14): qty 2

## 2018-08-19 MED ORDER — LANTHANUM CARBONATE 500 MG PO CHEW: 500.0000 mg | CHEWABLE_TABLET | ORAL | Status: DC | PRN

## 2018-08-19 MED ORDER — ACETAMINOPHEN 650 MG RE SUPP: 650.0000 mg | Freq: Four times a day (QID) | RECTAL | Status: DC | PRN

## 2018-08-19 MED ORDER — FENTANYL CITRATE (PF) 100 MCG/2ML IJ SOLN
50.0000 ug | Freq: Once | INTRAMUSCULAR | Status: AC
  Administered 2018-08-19: 50 ug via INTRAVENOUS
  Filled 2018-08-19: qty 2

## 2018-08-19 MED ORDER — FENTANYL CITRATE (PF) 100 MCG/2ML IJ SOLN: 50.0000 ug | INTRAMUSCULAR | Status: DC | PRN

## 2018-08-19 MED ORDER — ACETAMINOPHEN 325 MG PO TABS
650.0000 mg | ORAL_TABLET | Freq: Four times a day (QID) | ORAL | Status: DC | PRN
  Administered 2018-08-21: 650 mg via ORAL
  Filled 2018-08-19: qty 2

## 2018-08-19 MED ORDER — ONDANSETRON HCL 4 MG/2ML IJ SOLN
4.0000 mg | Freq: Four times a day (QID) | INTRAMUSCULAR | Status: DC | PRN
  Administered 2018-08-20: 4 mg via INTRAVENOUS
  Filled 2018-08-19: qty 2

## 2018-08-19 MED ORDER — AMIODARONE HCL 200 MG PO TABS
200.0000 mg | ORAL_TABLET | Freq: Every day | ORAL | Status: DC
  Administered 2018-08-20 – 2018-09-04 (×16): 200 mg via ORAL
  Filled 2018-08-19 (×16): qty 1

## 2018-08-19 MED ORDER — ATORVASTATIN CALCIUM 80 MG PO TABS
80.0000 mg | ORAL_TABLET | Freq: Every day | ORAL | Status: DC
  Administered 2018-08-20 – 2018-09-03 (×14): 80 mg via ORAL
  Filled 2018-08-19 (×15): qty 1

## 2018-08-19 MED ORDER — MIDODRINE HCL 5 MG PO TABS
15.0000 mg | ORAL_TABLET | Freq: Three times a day (TID) | ORAL | Status: DC
  Administered 2018-08-20 – 2018-09-04 (×45): 15 mg via ORAL
  Filled 2018-08-19 (×40): qty 3

## 2018-08-19 MED ORDER — FENTANYL CITRATE (PF) 100 MCG/2ML IJ SOLN
50.0000 ug | INTRAMUSCULAR | Status: DC | PRN
  Administered 2018-08-19: 50 ug via INTRAVENOUS
  Filled 2018-08-19: qty 2

## 2018-08-19 MED ORDER — ONDANSETRON HCL 4 MG PO TABS: 4.0000 mg | ORAL_TABLET | Freq: Four times a day (QID) | ORAL | Status: DC | PRN

## 2018-08-19 MED ORDER — FENTANYL CITRATE (PF) 100 MCG/2ML IJ SOLN
100.0000 ug | Freq: Once | INTRAMUSCULAR | Status: AC
  Administered 2018-08-19: 100 ug via INTRAVENOUS
  Filled 2018-08-19: qty 2

## 2018-08-19 NOTE — H&P (Addendum)
Date: 08/19/2018               Patient Name:  [name] [name] MRN: [id number]  DOB: 12/12/1965 Age / Sex: 53 y.o., male   PCP: Patient, No Pcp Per         Medical Service: Internal Medicine Teaching Service         Attending Physician: Dr. Rebeca Alert, Raynaldo Opitz, MD    First Contact: Dr. Annie Paras Pager: [phone]  Second Contact: Dr. Shan Levans Pager: [phone]       After Hours (After 5p/  First Contact Pager: [phone]  weekends / holidays): Second Contact Pager: [phone]   Chief Complaint: abdominal pain and penile bleeding   History of Present Illness: [name] [name] is a 53 yo man with CAD s/p CABG, CHF, ESRD on HD, polycystic kidney disease, and aortic valve replacement on Coumadin who presented with abdominal pain and bleeding from the penis that started this morning. He was recently hospitalized from 4/15 to 4/21 for a left-sided perinephric hematoma in the setting of PCKD with ruptured hemorrhagic cyst. He required blood transfusions and numerous fluid boluses for hypotension. Coumadin was initially held in the setting of acute anemia, but it was resumed prior to discharge and his INR was 2.2 at discharge. He was scheduled to f/u with urology in 3-4 months for repeat imaging. He has not seen urology since discharge, but he has seen his cardiologist who discontinued his aspirin.   [name] reports he has felt generalized weakness, lightheadedness with standing, and mild, intermittent abdominal pain since leaving the hospital. He has also had poor appetite and PO intake because he always feels full. He missed HD yesterday due to weakness. This morning, his abdominal pain was 10/10 and constant in the mid-abdomen from the umbilicus to his penis, in his low back, and in his left flank. Later in the day, he noticed frank blood from his urethra. He states the bleeding was profuse and created puddles around him. The bleeding has now resolved. He had a CT abdomen at an OSH prior to transfer here today  which showed increased perinephric hematoma.   He has been taking his warfarin every day as prescribed, but has not taken it yet today. He was last dialyzed on Thursday 4/30. He missed HD yesterday because he felt too weak to go. Denies fevers, chest pain, dyspnea, dysuria (pt makes very little urine), melena, hematochezia, n/v.   Upon arrival to the ED, he was afebrile, tachycardic in the 120s, tachypneic in the mid 20s, Bps in the 41R-830N systolic, no respiratory distress. Labs significant for leukocytosis of 19.1 with neutrophilia, Hb 7.6, plts 636, BUN 61, Cr 10.6, INR 5.5, Covid negative. CXR without edema or opacities. He received fentanyl with resolution of his pain.   Meds:  Current Meds  Medication Sig  . amiodarone (PACERONE) 200 MG tablet Take 1 tablet (200 mg total) by mouth daily. (Patient taking differently: Take 200 mg by mouth at bedtime. )  . atorvastatin (LIPITOR) 80 MG tablet Take 1 tablet (80 mg total) by mouth daily at 6 PM. (Patient taking differently: Take 80 mg by mouth daily. )  . Cinacalcet HCl (SENSIPAR PO) Take 1 tablet by mouth daily.  [name] lanthanum (FOSRENOL) 500 MG chewable tablet Chew 500-1,000 mg by mouth See admin instructions. Take 2 tablets (1000 mg) by mouth three times daily with meals, take 1 tablet (500 mg) with snacks  . midodrine (PROAMATINE) 5 MG tablet Take 3 tablets (15  mg total) by mouth 3 (three) times daily with meals. (Patient taking differently: Take 5 mg by mouth daily. )  . warfarin (COUMADIN) 5 MG tablet Take 1 tablet daily except 1/2 tablet on Wednesdays and Sundays or as directed by coumadin clinic (Patient taking differently: Take 2.5-5 mg by mouth See admin instructions. Take 1/2 tablet (2.5 mg) by mouth on Sunday and Tuesday nights, take 1 tablet (5 mg) on Monday, Wednesday, Thursday, Friday, Saturday nights or as directed by coumadin clinic)    Allergies: Allergies as of 08/19/2018  . (No Known Allergies)   Past Medical History:   Diagnosis Date  . Aneurysm ([place])     Right arm fistula 3 aneurysms 2011,   plans to have a new procedure  . Angina   . Chest discomfort   . CHF (congestive heart failure) ([place])   . Coronary artery disease   . Coronary artery disease involving native coronary artery of native heart without angina pectoris   . Dialysis patient ([place])   . Dysrhythmia   . ESRD (end stage renal disease) ([place])    on hemodialysis [place]  . Hypertension   . Leg pain   . Mitral regurgitation   . Morbid obesity ([place])   . Orthostatic hypotension   . Overweight(278.02)   . Renal insufficiency   . 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 Severe aortic insufficiency   . Sinus tachycardia   . Syncope     positional after dialysis... 2008   Past Surgical History:  Procedure Laterality Date  . A/V FISTULAGRAM Left 06/30/2016   Procedure: A/V Fistulagram;  Surgeon: Algernon Huxley, MD;  Location: [place] [facility];  Service: Cardiovascular;  Laterality: Left;  . AORTIC VALVE REPLACEMENT N/A 09/01/2017   Procedure: AORTIC VALVE REPLACEMENT (AVR);  Surgeon: [name], MD;  Location: [place];  Service: Open Heart Surgery;  Laterality: N/A;  . ARTERIOVENOUS GRAFT PLACEMENT    . AV FISTULA PLACEMENT    . AV FISTULA PLACEMENT Left 11/18/2015   Procedure: ARTERIOVENOUS (AV) FISTULA CREATION ( RADIOCEPHALIC );  Surgeon: Algernon Huxley, MD;  Location: [facility];  Service: Vascular;  Laterality: Left;  . AV FISTULA PLACEMENT Left 03/23/2016   Procedure: ARTERIOVENOUS (AV) FISTULA CREATION ( REVISION );  Surgeon: Algernon Huxley, MD;  Location: [facility];  Service: Vascular;  Laterality: Left;  . [place] REMOVAL Left 03/23/2016   Procedure: REMOVAL OF ARTERIOVENOUS GORETEX GRAFT ([place]);  Surgeon: Algernon Huxley, MD;  Location: [facility];  Service: Vascular;  Laterality: Left;  . CENTRAL LINE INSERTION Right  09/01/2017   Procedure: FLOROSCOPY GUIDED PLACEMENT OF RIGHT FEMORAL CENTRAL LINE TIMES TWO  AND PLACEMENT OF SWAN GANZ CATHETER;  Surgeon: [name], MD;  Location: [place];  Service: Open Heart Surgery;  Laterality: Right;  . CORONARY ARTERY BYPASS GRAFT N/A 09/01/2017   Procedure: CORONARY ARTERY BYPASS GRAFTING (CABG) X 1, USING RIGHT GREATER SAPHENOUS VEIN HARVESTED ENDOSCOPICALLY;  Surgeon: [name], MD;  Location: [place];  Service: Open Heart Surgery;  Laterality: N/A;  . DIALYSIS/PERMA CATHETER REMOVAL N/A 08/01/2016   Procedure: Dialysis/Perma Catheter Removal;  Surgeon: Algernon Huxley, MD;  Location: [place] [facility];  Service: Cardiovascular;  Laterality: N/A;  . HERNIA REPAIR    . INSERTION OF DIALYSIS CATHETER  03/01/2011   Procedure:  INSERTION OF DIALYSIS CATHETER;  Surgeon: Elam Dutch, MD;  Location: [place];  Service: Vascular;  Laterality: Left;  Exchange of Dialysis Catheter to 27cm 15Fr. Arrow Catheter  . INSERTION OF DIALYSIS CATHETER  09/01/2017   Procedure: PLACEMENT OF TRIALYSIS SHORT TERM DIALYSIS CATHETER;  Surgeon: [name], MD;  Location: [place] [facility];  Service: Open Heart Surgery;;  . MULTIPLE EXTRACTIONS WITH ALVEOLOPLASTY N/A 08/07/2017   Procedure: Extraction of tooth #2, 7,8,13,14,15,17, and 31 with alveoloplasty and gross debridement of remaining teeth;  Surgeon: [name], DDS;  Location: [place];  Service: Oral Surgery;  Laterality: N/A;  . PERIPHERAL VASCULAR CATHETERIZATION N/A 09/01/2014   Procedure: A/V Shuntogram/Fistulagram;  Surgeon: Algernon Huxley, MD;  Location: [place] [facility];  Service: Cardiovascular;  Laterality: N/A;  . PERIPHERAL VASCULAR CATHETERIZATION Right 09/01/2014   Procedure: Thrombectomy;  Surgeon: Algernon Huxley, MD;  Location: [place] [facility];  Service: Cardiovascular;  Laterality: Right;  . PERIPHERAL VASCULAR CATHETERIZATION Right 09/01/2014   Procedure: A/V Shunt Intervention;  Surgeon: Algernon Huxley, MD;  Location:  [place] [facility];  Service: Cardiovascular;  Laterality: Right;  . PERIPHERAL VASCULAR CATHETERIZATION N/A 09/17/2014   Procedure: A/V Shuntogram/Fistulagram;  Surgeon: Algernon Huxley, MD;  Location: [place] [facility];  Service: Cardiovascular;  Laterality: N/A;  . PERIPHERAL VASCULAR CATHETERIZATION Right 10/17/2014   Procedure: Thrombectomy;  Surgeon: Katha Cabal, MD;  Location: [place] [facility];  Service: Cardiovascular;  Laterality: Right;  . PERIPHERAL VASCULAR CATHETERIZATION N/A 10/17/2014   Procedure: A/V Shuntogram/Fistulagram;  Surgeon: Katha Cabal, MD;  Location: [place] [facility];  Service: Cardiovascular;  Laterality: N/A;  . PERIPHERAL VASCULAR CATHETERIZATION N/A 10/17/2014   Procedure: A/V Shunt Intervention;  Surgeon: Katha Cabal, MD;  Location: [place] [facility];  Service: Cardiovascular;  Laterality: N/A;  . PERIPHERAL VASCULAR CATHETERIZATION Right 11/04/2014   Procedure: Thrombectomy;  Surgeon: Katha Cabal, MD;  Location: [place] [facility];  Service: Cardiovascular;  Laterality: Right;  . PERIPHERAL VASCULAR CATHETERIZATION Left 11/04/2014   Procedure: Visceral Venography;  Surgeon: Katha Cabal, MD;  Location: [place] [facility];  Service: Cardiovascular;  Laterality: Left;  . PERIPHERAL VASCULAR CATHETERIZATION Right 11/27/2014   Procedure: A/V Shuntogram/Fistulagram;  Surgeon: Algernon Huxley, MD;  Location: [place] [facility];  Service: Cardiovascular;  Laterality: Right;  . PERIPHERAL VASCULAR CATHETERIZATION Right 11/27/2014   Procedure: A/V Shunt Intervention;  Surgeon: Algernon Huxley, MD;  Location: [place] [facility];  Service: Cardiovascular;  Laterality: Right;  . PERIPHERAL VASCULAR CATHETERIZATION Right 12/31/2014   Procedure: Thrombectomy;  Surgeon: Algernon Huxley, MD;  Location: [place] [facility];  Service: Cardiovascular;  Laterality: Right;  . PERIPHERAL VASCULAR CATHETERIZATION Right 01/12/2015   Procedure: A/V  Shuntogram/Fistulagram;  Surgeon: Algernon Huxley, MD;  Location: [place] [facility];  Service: Cardiovascular;  Laterality: Right;  . PERIPHERAL VASCULAR CATHETERIZATION N/A 01/12/2015   Procedure: A/V Shunt Intervention;  Surgeon: Algernon Huxley, MD;  Location: [place] [facility];  Service: Cardiovascular;  Laterality: N/A;  . PERIPHERAL VASCULAR CATHETERIZATION Right 01/28/2015   Procedure: Thrombectomy;  Surgeon: Algernon Huxley, MD;  Location: [place] [facility];  Service: Cardiovascular;  Laterality: Right;  . PERIPHERAL VASCULAR CATHETERIZATION N/A 06/08/2015   Procedure: A/V Shuntogram/Fistulagram;  Surgeon: Algernon Huxley, MD;  Location: [place] [facility];  Service: Cardiovascular;  Laterality: N/A;  . PERIPHERAL VASCULAR CATHETERIZATION N/A 06/08/2015   Procedure: A/V Shunt Intervention;  Surgeon: Algernon Huxley, MD;  Location: [place] [facility];  Service: Cardiovascular;  Laterality: N/A;  . PERIPHERAL VASCULAR CATHETERIZATION Right 07/06/2015   Procedure: A/V Shuntogram/Fistulagram;  Surgeon: Algernon Huxley, MD;  Location: [place] [facility];  Service: Cardiovascular;  Laterality: Right;  . PERIPHERAL VASCULAR CATHETERIZATION N/A 07/06/2015   Procedure: A/V Shunt Intervention;  Surgeon: Algernon Huxley, MD;  Location: [place] [facility];  Service: Cardiovascular;  Laterality: N/A;  . PERIPHERAL VASCULAR CATHETERIZATION N/A 08/04/2015   Procedure: graft declot;  Surgeon: Katha Cabal, MD;  Location: [place] [facility];  Service: Cardiovascular;  Laterality: N/A;  . PERIPHERAL VASCULAR CATHETERIZATION Right 08/25/2015   Procedure: A/V Shuntogram/Fistulagram;  Surgeon: Katha Cabal, MD;  Location: [place] [facility];  Service: Cardiovascular;  Laterality: Right;  . PERIPHERAL VASCULAR CATHETERIZATION N/A 11/04/2015   Procedure: Dialysis/Perma Catheter Insertion;  Surgeon: Algernon Huxley, MD;  Location: [place] [facility];  Service: Cardiovascular;  Laterality: N/A;  . PERIPHERAL VASCULAR  CATHETERIZATION Left 12/14/2015   Procedure: A/V Shuntogram/Fistulagram;  Surgeon: Algernon Huxley, MD;  Location: [place] [facility];  Service: Cardiovascular;  Laterality: Left;  . PERIPHERAL VASCULAR CATHETERIZATION N/A 12/14/2015   Procedure: A/V Shunt Intervention;  Surgeon: Algernon Huxley, MD;  Location: [place] [facility];  Service: Cardiovascular;  Laterality: N/A;  . PERIPHERAL VASCULAR CATHETERIZATION N/A 12/17/2015   Procedure: Dialysis/Perma Catheter Insertion;  Surgeon: Algernon Huxley, MD;  Location: [place] [facility];  Service: Cardiovascular;  Laterality: N/A;  . PERIPHERAL VASCULAR CATHETERIZATION Left 12/22/2015   Procedure: Dialysis/Perma Catheter Insertion;  Surgeon: Katha Cabal, MD;  Location: [place] [facility];  Service: Cardiovascular;  Laterality: Left;  . PERIPHERAL VASCULAR CATHETERIZATION Left 02/29/2016   Procedure: A/V Shuntogram/Fistulagram;  Surgeon: Algernon Huxley, MD;  Location: [place] [facility];  Service: Cardiovascular;  Laterality: Left;  . PERIPHERAL VASCULAR CATHETERIZATION N/A 02/29/2016   Procedure: A/V Shunt Intervention;  Surgeon: Algernon Huxley, MD;  Location: [place] [facility];  Service: Cardiovascular;  Laterality: N/A;  . POCKET REVISION/RELOCATION N/A 01/01/2018   Procedure: POCKET REVISION/RELOCATION;  Surgeon: Evans Lance, MD;  Location: [place] [facility];  Service: Cardiovascular;  Laterality: N/A;  . right arm graft     for dyalisis  . RIGHT/LEFT HEART CATH AND CORONARY ANGIOGRAPHY N/A 08/02/2017   Procedure: RIGHT/LEFT HEART CATH AND CORONARY ANGIOGRAPHY;  Surgeon: Larey Dresser, MD;  Location: [place] [facility];  Service: Cardiovascular;  Laterality: N/A;  . SUBQ ICD IMPLANT N/A 11/28/2017   Procedure: SUBQ ICD IMPLANT;  Surgeon: Evans Lance, MD;  Location: [place] [facility];  Service: Cardiovascular;  Laterality: N/A;  . SUBQ ICD REVISION N/A 02/12/2018   Procedure: SUBQ ICD REVISION(REMOVAL);  Surgeon: Evans Lance, MD;   Location: [place] [facility];  Service: Cardiovascular;  Laterality: N/A;  . TEE WITHOUT CARDIOVERSION N/A 07/31/2017   Procedure: TRANSESOPHAGEAL ECHOCARDIOGRAM (TEE);  Surgeon: Josue Hector, MD;  Location[facility];  Service: Cardiovascular;  Laterality: N/A;  . TEE WITHOUT CARDIOVERSION N/A 09/01/2017   Procedure: TRANSESOPHAGEAL ECHOCARDIOGRAM (TEE);  Surgeon: [name], MD;  Location: [place];  Service: Open Heart Surgery;  Laterality: N/A;  . THROMBECTOMY    . THROMBECTOMY W/ EMBOLECTOMY  03/01/2011   Procedure: THROMBECTOMY ARTERIOVENOUS GORE-TEX GRAFT;  Surgeon: Elam Dutch, MD;  Location: [place];  Service: Vascular;  Laterality: Right;  Attempted Thrombectomy of Old  Right Upper Arm Arteriovenous gortex  Graft. Insertion of new Arteriovenous Graft using 76mm x 50cm Gortex Stretch graft.   . THROMBECTOMY W/ EMBOLECTOMY  07/11/2011   Procedure: THROMBECTOMY ARTERIOVENOUS GORE-TEX GRAFT;  Surgeon: Rosetta Posner, MD;  Location: [place];  Service: Vascular;  Laterality: Right;  . UMBILICAL HERNIA REPAIR    . VENOGRAM N/A 08/23/2011   Procedure: VENOGRAM;  Surgeon: [name], MD;  [facility];  Service: Cardiovascular;  Laterality: N/A;    Family History:  Family History  Problem Relation Age of Onset  . Hypertension Mother   . Heart disease Mother   . Hypertension Father   . Heart disease Father    Social History: Lives at home with wife and kids. Has been on HD for 18 years. Does not work currently. Never smoker. Denies etoh or illicit drug use.   Review of Systems: A complete ROS was negative except as per HPI.   Physical Exam: Blood pressure 100/77, pulse (!) 115, temperature 99.6 F (37.6 C), temperature source Oral, resp. rate (!) 26, height 6\' 1"  (1.854 m), weight 132 kg, SpO2 98 %.  Constitutional: Alert and oriented x3. No distress.  Eyes: Pupils are equal, round, and reactive to light. EOM are normal.  Cardiovascular: Normal rate and regular  rhythm. AVR click.  Pulmonary/Chest: Effort normal. Clear to auscultation bilaterally. No wheezes, rales, or rhonchi. Abdominal: Bowel sounds present. Obese, soft, non-distended, ttp in the mid abdomen.  GU: Slowly oozing blood from the urethra.  Ext: No lower extremity edema. Skin: Warm and dry. No rashes or wounds.  CXR: personally reviewed my interpretation is no edema or opacities.  Assessment & Plan by Problem: Active Problems:   Gross hematuria  Mr. Juhnke is a 53 yo man with CAD s/p CABG, CHF, ESRD on HD, polycystic kidney disease, and aortic valve replacement on Coumadin who presented with abdominal pain and bleeding from the penis secondary to an expanding left-sided perinephric hematoma from PCKD and supratherapeutic INR.   L perinephric hematoma: Second admission for this hematoma. Hematoma likely expanding from supratherapeutic INR, causing abdominal pain and urethral bleeding. Hb is stable from discharge at 7.6.  - Urology following, appreciate recs - Repeat CBC this evening. Transfuse if Hb <7 - Hold coumadin. - Fentanyl q3hrs prn for pain  - Tele - NPO  AVR on Coumadin Supratherapeutic INR Goal INR is 2-3 for mechanical AVR, INR 5.5 today - Hold coumadin  - Trend INR  ESRD on HD TTS: Last dialyzed 4/30. Missed yesterday's session. Normokalemic. No pulmonary edema. No urgent indication for HD. - Consult nephrology in the am  - Continue home fosrenol - Trend BMP   Hypotension: Required multiple fluid boluses during last admission for hypotension. Takes midodrine daily. - Continue home midodrine 15mg  TID  CAD s/p CABG Systolic CHF Echo on 0/93 showed an EF of 30% and grade 1 diastolic dysfunction - Continue home amiodarone 200mg  daily - Volume management with HD  Leukocytosis: White count elevated to 19.1. No obvious signs of infection. Patient is mostly anuric. Normal CXR. Afebrile. May be stress reaction 2/2 acute blood loss or resorption of known hematoma.   - Trend CBC  FEN: no IV fluids, NPO diet, replace electrolytes as needed  DVT ppx: SCDs Code status: FULL code  Dispo: Admit patient to Inpatient with expected length of stay greater than 2 midnights.  Signed: Corinne Ports, MD 08/19/2018, 7:40 PM  Pager: [phone]

## 2018-08-19 NOTE — ED Provider Notes (Signed)
[name] Provider Note   CSN: [id number] Arrival date & time: 08/19/18  1428    History   Chief Complaint Chief Complaint  Patient presents with   Abdominal Pain    HPI [name] is a 53 y.o. male.     HPI  53 year old male comes in with chief complaint of bloody urine.  He has history of ESRD, polycystic kidney, coronary artery disease and he is on Coumadin.  He reports that his been having about a month worth of bloody urine.  He was admitted to the [name] recently and evaluated by urology.  At that time he was noted to have perinephric hematoma, but he was also supratherapeutic in his INR.  His Coumadin was held and his bleeding improved, hemoglobin stabilized therefore he was discharged to the [name].  He states that this morning he started having worsening of the bleeding and back pain.  He denies any fevers, chills.  Patient went to [name] rocking him, and they transferred him for further evaluation to [name].  Past Medical History:  Diagnosis Date   Aneurysm ([place])     Right arm fistula 3 aneurysms 2011,   plans to have a new procedure   Angina    Chest discomfort    CHF (congestive heart failure) ([place])    Coronary artery disease    Coronary artery disease involving native coronary artery of native heart without angina pectoris    Dialysis patient [name])    Dysrhythmia    ESRD (end stage renal disease) ([place])    on hemodialysis [id number]_[id number]   Hypertension    Leg pain    Mitral regurgitation    Morbid obesity (HCC)    Orthostatic hypotension    Overweight(278.02)    Renal insufficiency    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   Coronary artery disease involving native coronary artery of native heart without angina pectoris    Chronic periodontitis 08/07/2017   Abnormal echocardiogram    Mitral regurgitation    Severe aortic insufficiency    Acute on chronic combined systolic and diastolic heart failure (HCC)    Systolic CHF ([place]) 99/24/2683   AV graft thrombosis, subsequent encounter 07/28/2017   Dermatitis 04/06/2016   End stage renal disease ([place]) 12/22/2011   Sinus tachycardia    ESRD (end stage renal disease) ([place])    Orthostatic hypotension    Chest discomfort    Morbid obesity ([place])    Syncope    Aneurysm ([place])     Past Surgical History:  Procedure Laterality Date   A/V FISTULAGRAM Left 06/30/2016   Procedure: A/V Fistulagram;  Surgeon: Algernon Huxley, MD;  Location: [place] [facility];  Service: Cardiovascular;  Laterality: Left;   AORTIC VALVE REPLACEMENT N/A 09/01/2017   Procedure: AORTIC VALVE REPLACEMENT (  AVR);  Surgeon: [name], MD;  Location: [place];  Service: Open Heart Surgery;  Laterality: N/A;   ARTERIOVENOUS GRAFT PLACEMENT     AV FISTULA PLACEMENT     AV FISTULA PLACEMENT Left 11/18/2015   Procedure: ARTERIOVENOUS (AV) FISTULA CREATION ( RADIOCEPHALIC );  Surgeon: Algernon Huxley, MD;  Location: [facility];  Service: Vascular;  Laterality: Left;   AV FISTULA PLACEMENT Left 03/23/2016   Procedure: ARTERIOVENOUS (AV) FISTULA CREATION ( REVISION );  Surgeon: Algernon Huxley, MD;  Location: [facility];  Service: Vascular;  Laterality: Left;   [place]  REMOVAL Left 03/23/2016   Procedure: REMOVAL OF ARTERIOVENOUS GORETEX GRAFT ([place]);  Surgeon: Algernon Huxley, MD;  Location: [facility];  Service: Vascular;  Laterality: Left;   CENTRAL LINE INSERTION Right 09/01/2017   Procedure: FLOROSCOPY GUIDED PLACEMENT OF RIGHT FEMORAL CENTRAL LINE TIMES TWO  AND PLACEMENT OF SWAN GANZ CATHETER;  Surgeon: [name], MD;  Location: [place];  Service: Open Heart Surgery;  Laterality: Right;   CORONARY ARTERY BYPASS GRAFT N/A 09/01/2017   Procedure: CORONARY ARTERY BYPASS GRAFTING (CABG) X 1, USING RIGHT GREATER SAPHENOUS VEIN HARVESTED ENDOSCOPICALLY;  Surgeon: [name], MD;  Location: [place];  Service: Open Heart Surgery;  Laterality: N/A;   DIALYSIS/PERMA CATHETER REMOVAL N/A 08/01/2016   Procedure: Dialysis/Perma Catheter Removal;  Surgeon: Algernon Huxley, MD;  Location: [place] [facility];  Service: Cardiovascular;  Laterality: N/A;   HERNIA REPAIR     INSERTION OF DIALYSIS CATHETER  03/01/2011   Procedure: INSERTION OF DIALYSIS CATHETER;  Surgeon: Elam Dutch, MD;  Location: [place];  Service: Vascular;  Laterality: Left;  Exchange of Dialysis Catheter to 27cm 15Fr. Arrow Catheter   INSERTION OF DIALYSIS CATHETER  09/01/2017   Procedure: PLACEMENT OF TRIALYSIS SHORT TERM DIALYSIS CATHETER;  Surgeon: [name], MD;  Location: [name];  Service: Open Heart Surgery;;   MULTIPLE EXTRACTIONS WITH ALVEOLOPLASTY N/A 08/07/2017   Procedure: Extraction of tooth #2, 7,8,13,14,15,17, and 31 with alveoloplasty and gross debridement of remaining teeth;  Surgeon: [name], DDS;  Location: [place];  Service: Oral Surgery;  Laterality: N/A;   PERIPHERAL VASCULAR CATHETERIZATION N/A 09/01/2014   Procedure: A/V Shuntogram/Fistulagram;  Surgeon: Algernon Huxley, MD;  Location: [place] [facility];  Service: Cardiovascular;  Laterality: N/A;   PERIPHERAL VASCULAR CATHETERIZATION Right 09/01/2014   Procedure: Thrombectomy;  Surgeon: Algernon Huxley, MD;   Location: [place] [facility];  Service: Cardiovascular;  Laterality: Right;   PERIPHERAL VASCULAR CATHETERIZATION Right 09/01/2014   Procedure: A/V Shunt Intervention;  Surgeon: Algernon Huxley, MD;  Location: [place] [facility];  Service: Cardiovascular;  Laterality: Right;   PERIPHERAL VASCULAR CATHETERIZATION N/A 09/17/2014   Procedure: A/V Shuntogram/Fistulagram;  Surgeon: Algernon Huxley, MD;  Location: [place] [facility];  Service: Cardiovascular;  Laterality: N/A;   PERIPHERAL VASCULAR CATHETERIZATION Right 10/17/2014   Procedure: Thrombectomy;  Surgeon: Katha Cabal, MD;  Location: [place] [facility];  Service: Cardiovascular;  Laterality: Right;   PERIPHERAL VASCULAR CATHETERIZATION N/A 10/17/2014   Procedure: A/V Shuntogram/Fistulagram;  Surgeon: Katha Cabal, MD;  Location: [place] [facility];  Service: Cardiovascular;  Laterality: N/A;   PERIPHERAL VASCULAR CATHETERIZATION N/A 10/17/2014   Procedure: A/V Shunt Intervention;  Surgeon: Katha Cabal, MD;  Location: [place] [facility];  Service: Cardiovascular;  Laterality: N/A;   PERIPHERAL VASCULAR CATHETERIZATION Right 11/04/2014   Procedure: Thrombectomy;  Surgeon: Katha Cabal,  MD;  Location: [place] [facility];  Service: Cardiovascular;  Laterality: Right;   PERIPHERAL VASCULAR CATHETERIZATION Left 11/04/2014   Procedure: Visceral Venography;  Surgeon: Katha Cabal, MD;  Location: [place] [facility];  Service: Cardiovascular;  Laterality: Left;   PERIPHERAL VASCULAR CATHETERIZATION Right 11/27/2014   Procedure: A/V Shuntogram/Fistulagram;  Surgeon: Algernon Huxley, MD;  Location: [place] [facility];  Service: Cardiovascular;  Laterality: Right;   PERIPHERAL VASCULAR CATHETERIZATION Right 11/27/2014   Procedure: A/V Shunt Intervention;  Surgeon: Algernon Huxley, MD;  Location: [place] [facility];  Service: Cardiovascular;  Laterality: Right;   PERIPHERAL VASCULAR CATHETERIZATION Right 12/31/2014   Procedure:  Thrombectomy;  Surgeon: Algernon Huxley, MD;  Location: [place] [facility];  Service: Cardiovascular;  Laterality: Right;   PERIPHERAL VASCULAR CATHETERIZATION Right 01/12/2015   Procedure: A/V Shuntogram/Fistulagram;  Surgeon: Algernon Huxley, MD;  Location: [place] [facility];  Service: Cardiovascular;  Laterality: Right;   PERIPHERAL VASCULAR CATHETERIZATION N/A 01/12/2015   Procedure: A/V Shunt Intervention;  Surgeon: Algernon Huxley, MD;  Location: [place] [facility];  Service: Cardiovascular;  Laterality: N/A;   PERIPHERAL VASCULAR CATHETERIZATION Right 01/28/2015   Procedure: Thrombectomy;  Surgeon: Algernon Huxley, MD;  Location: [place] [facility];  Service: Cardiovascular;  Laterality: Right;   PERIPHERAL VASCULAR CATHETERIZATION N/A 06/08/2015   Procedure: A/V Shuntogram/Fistulagram;  Surgeon: Algernon Huxley, MD;  Location: [place] [facility];  Service: Cardiovascular;  Laterality: N/A;   PERIPHERAL VASCULAR CATHETERIZATION N/A 06/08/2015   Procedure: A/V Shunt Intervention;  Surgeon: Algernon Huxley, MD;  Location: [place] [facility];  Service: Cardiovascular;  Laterality: N/A;   PERIPHERAL VASCULAR CATHETERIZATION Right 07/06/2015   Procedure: A/V Shuntogram/Fistulagram;  Surgeon: Algernon Huxley, MD;  Location: [place] [facility];  Service: Cardiovascular;  Laterality: Right;   PERIPHERAL VASCULAR CATHETERIZATION N/A 07/06/2015   Procedure: A/V Shunt Intervention;  Surgeon: Algernon Huxley, MD;  Location: [place] [facility];  Service: Cardiovascular;  Laterality: N/A;   PERIPHERAL VASCULAR CATHETERIZATION N/A 08/04/2015   Procedure: graft declot;  Surgeon: Katha Cabal, MD;  Location: [place] [facility];  Service: Cardiovascular;  Laterality: N/A;   PERIPHERAL VASCULAR CATHETERIZATION Right 08/25/2015   Procedure: A/V Shuntogram/Fistulagram;  Surgeon: Katha Cabal, MD;  Location: [place] [facility];  Service: Cardiovascular;  Laterality: Right;   PERIPHERAL VASCULAR CATHETERIZATION  N/A 11/04/2015   Procedure: Dialysis/Perma Catheter Insertion;  Surgeon: Algernon Huxley, MD;  Location: [place] [facility];  Service: Cardiovascular;  Laterality: N/A;   PERIPHERAL VASCULAR CATHETERIZATION Left 12/14/2015   Procedure: A/V Shuntogram/Fistulagram;  Surgeon: Algernon Huxley, MD;  Location: [place] [facility];  Service: Cardiovascular;  Laterality: Left;   PERIPHERAL VASCULAR CATHETERIZATION N/A 12/14/2015   Procedure: A/V Shunt Intervention;  Surgeon: Algernon Huxley, MD;  Location: [place] [facility];  Service: Cardiovascular;  Laterality: N/A;   PERIPHERAL VASCULAR CATHETERIZATION N/A 12/17/2015   Procedure: Dialysis/Perma Catheter Insertion;  Surgeon: Algernon Huxley, MD;  Location: [place] [facility];  Service: Cardiovascular;  Laterality: N/A;   PERIPHERAL VASCULAR CATHETERIZATION Left 12/22/2015   Procedure: Dialysis/Perma Catheter Insertion;  Surgeon: Katha Cabal, MD;  Location: [place] [facility];  Service: Cardiovascular;  Laterality: Left;   PERIPHERAL VASCULAR CATHETERIZATION Left 02/29/2016   Procedure: A/V Shuntogram/Fistulagram;  Surgeon: Algernon Huxley, MD;  Location: [place] [facility];  Service: Cardiovascular;  Laterality: Left;   PERIPHERAL VASCULAR CATHETERIZATION N/A 02/29/2016  Procedure: A/V Shunt Intervention;  Surgeon: Algernon Huxley, MD;  Location: [place] [facility];  Service: Cardiovascular;  Laterality: N/A;   POCKET REVISION/RELOCATION N/A 01/01/2018   Procedure: POCKET REVISION/RELOCATION;  Surgeon: Evans Lance, MD;  Location: [place] [facility];  Service: Cardiovascular;  Laterality: N/A;   right arm graft     for dyalisis   RIGHT/LEFT HEART CATH AND CORONARY ANGIOGRAPHY N/A 08/02/2017   Procedure: RIGHT/LEFT HEART CATH AND CORONARY ANGIOGRAPHY;  Surgeon: Larey Dresser, MD;  Location: [place] [facility];  Service: Cardiovascular;  Laterality: N/A;   SUBQ ICD IMPLANT N/A 11/28/2017   Procedure: SUBQ ICD IMPLANT;  Surgeon: Evans Lance, MD;   Location: [place] [facility];  Service: Cardiovascular;  Laterality: N/A;   SUBQ ICD REVISION N/A 02/12/2018   Procedure: SUBQ ICD REVISION(REMOVAL);  Surgeon: Evans Lance, MD;  Location: [place] [facility];  Service: Cardiovascular;  Laterality: N/A;   TEE WITHOUT CARDIOVERSION N/A 07/31/2017   Procedure: TRANSESOPHAGEAL ECHOCARDIOGRAM (TEE);  Surgeon: Josue Hector, MD;  [name];  Service: Cardiovascular;  Laterality: N/A;   TEE WITHOUT CARDIOVERSION N/A 09/01/2017   Procedure: TRANSESOPHAGEAL ECHOCARDIOGRAM (TEE);  Surgeon: [name], MD;  Location: [place];  Service: Open Heart Surgery;  Laterality: N/A;   THROMBECTOMY     THROMBECTOMY W/ EMBOLECTOMY  03/01/2011   Procedure: THROMBECTOMY ARTERIOVENOUS GORE-TEX GRAFT;  Surgeon: Elam Dutch, MD;  Location: [place];  Service: Vascular;  Laterality: Right;  Attempted Thrombectomy of Old  Right Upper Arm Arteriovenous gortex Graft. Insertion of new Arteriovenous Graft using 29mm x 50cm Gortex Stretch graft.    THROMBECTOMY W/ EMBOLECTOMY  07/11/2011   Procedure: THROMBECTOMY ARTERIOVENOUS GORE-TEX GRAFT;  Surgeon: Rosetta Posner, MD;  [facility];  Service: Vascular;  Laterality: Right;   UMBILICAL HERNIA REPAIR     VENOGRAM N/A 08/23/2011   Procedure: VENOGRAM;  Surgeon: [name], MD;  Location: [facility] C[facility] LAB;  Service: Cardiovascular;  Laterality: N/A;        Home Medications    Prior to Admission medications   Medication Sig Start Date End Date Taking? Authorizing Provider  amiodarone (PACERONE) 200 MG tablet Take 1 tablet (200 mg total) by mouth daily. 01/16/18   Evans Lance, MD  atorvastatin (LIPITOR) 80 MG tablet Take 1 tablet (80 mg total) by mouth daily at 6 PM. 10/31/17   Larey Dresser, MD  B Complex-C-Folic Acid (NEPHRO-VITE PO) Take 1 tablet by mouth daily.     [provider]  FOSRENOL 500 MG chewable tablet Chew 1,500-2,000 mg by mouth See admin instructions. Take 2000 mg by  mouth with meals and take 1500 mg by mouth with snacks 05/28/13   [provider]  midodrine (PROAMATINE) 5 MG tablet Take 3 tablets (15 mg total) by mouth 3 (three) times daily with meals. 01/09/18   Evans Lance, MD  warfarin (COUMADIN) 5 MG tablet Take 1 tablet daily except 1/2 tablet on Wednesdays and Sundays or as directed by coumadin clinic 08/07/18   Asencion Noble, MD    Family History Family History  Problem Relation Age of Onset   Hypertension Mother    Heart disease Mother    Hypertension Father    Heart disease Father     Social History Social History   Tobacco Use   Smoking status: Former Smoker    Types: Cigarettes    Last attempt to quit: 04/18/1994    Years  since quitting: 24.3   Smokeless tobacco: Current User    Types: Snuff   Tobacco comment: dips 1/2 snuff per day times 25 years  Substance Use Topics   Alcohol use: No   Drug use: No     Allergies   Patient has no known allergies.   Review of Systems Review of Systems  Constitutional: Positive for activity change.  Gastrointestinal: Negative for abdominal pain.  Genitourinary: Positive for hematuria.  Hematological: Bruises/bleeds easily.  All other systems reviewed and are negative.    Physical Exam Updated Vital Signs BP 122/77 (BP Location: Right Arm)    Pulse (!) 124    Temp 99.6 F (37.6 C) (Oral)    Resp 20    Ht 6\' 1"  (1.854 m)    Wt 132 kg    SpO2 100%    BMI 38.39 kg/m   Physical Exam Vitals signs and nursing note reviewed.  Constitutional:      Appearance: He is well-developed.  HENT:     Head: Atraumatic.  Neck:     Musculoskeletal: Neck supple.  Cardiovascular:     Rate and Rhythm: Normal rate.  Pulmonary:     Effort: Pulmonary effort is normal.  Abdominal:     Tenderness: There is no abdominal tenderness.  Skin:    General: Skin is warm.  Neurological:     Mental Status: He is alert and oriented to person, place, and time.      ED Treatments /  Results  Labs (all labs ordered are listed, but only abnormal results are displayed) Labs Reviewed  COMPREHENSIVE METABOLIC PANEL  CBC WITH DIFFERENTIAL/PLATELET  PROTIME-INR  APTT  TYPE AND SCREEN    EKG None  Radiology No results found.  Procedures Procedures (including critical care time)  Medications Ordered in ED Medications - No data to display   Initial Impression / Assessment and Plan / ED Course  I have reviewed the triage vital signs and the nursing notes.  Pertinent labs & imaging results that were available during my care of the patient were reviewed by me and considered in my medical decision making (see chart for details).        53 year old male comes in a chief complaint of back pain and bloody urine. He has history of ESRD, polycystic kidney disease, and a recent admission for perinephric hematoma.  He reports that he started having worsening of the bloody urine today along with back pain.  [facility] to [name] and was transferred to our ER for further evaluation by urologist.  Patient's last dialysis session was on Thursday.  On exam patient is noted to have gross hematuria. Differential diagnosis includes cystitis, although patient does not make urine it is still a possibility.  Appropriate labs have been ordered.  I spoken with Dr. Carlean Purl, urology who will come and assess the patient.  In the interim Dr. Vanita Panda will follow up on the lab results.  Final Clinical Impressions(s) / ED Diagnoses   Final diagnoses:  Gross hematuria    ED Discharge Orders    None       Varney Biles, MD 08/19/18 [phone]

## 2018-08-19 NOTE — ED Triage Notes (Signed)
Pt transported form [place] with comp[aints of abdominal pain and bleeding out his penis. Pt states every time he urinates he bleeds. Dialysis treatment 08/16/18. Car link gave 4mg  of Morphine at 1330 for pain. Pt endorces 10/10 pain at this time

## 2018-08-19 NOTE — Consult Note (Signed)
Urology Consult Note   Requesting Attending Physician:  Varney Biles, MD Service Providing Consult: Urology  Consulting Attending: Louis Meckel, MD   Reason for Consult: Abdominal pain, hematuria  HPI: [name] [name] is seen in consultation for reasons noted above at the request of Varney Biles, MD for evaluation of abdominal pain and hematuria.  This is a 53 y.o. male with a history of ESRD on HD for about 18 years, prior mechanical heart valve on chronic anticoagulation (coumadin), CAD, CHF, prior CABG and polycystic kidney disease who was transferred from an OSH ED for evaluation of abdominal pain and hematuria. He felt weak yesterday and skipped dialysis, but then felt significantly better yesterday evening. However, he subsequently developed left abdominal pain and blood per urethra earlier today. He denies suprapubic discomfort, urge to void, or bladder spasms. His pain is the worse in the left upper abdomen and back, radiating towards midline. He also reports an episode of bowel incontinence this AM.   The patient was admitted to the medicine service about two weeks ago due to a similar complaint and was found to have a left perinephric hematoma, likely 2/2 supratherapeutic INR and bleeding into a hemorrhagic cyst. In the ED, his INR was 7.3, increased to 8.3 during that admission, and was 2.2 on day of discharge. It increased to 3.8 on 4/23 and he was instructed to hold his coumadin for a day, skip the next two days, and then resume regular dosing. He was scheduled to be seen last Tuesday for repeat INR, but the clinic cancelled it and rescheduled it for a week later.   CT today performed at the OSH demonstrates a perinephric hematoma, which is increased in size compared to his scan during prior admission, consistent with bleeding in this kidney due to a supratherapeutic INR.   In the ED, he is tachycardic and BP 100/69. He is asymptomatic from this perspective, but reports 10/10  abdominal pain. Labs notable for a Hgb 7.6 (7.9 at discharge on 4/21). His INR is 5.5 (on coumadin).   Past Medical History: Past Medical History:  Diagnosis Date  . Aneurysm ([place])     Right arm fistula 3 aneurysms 2011,   plans to have a new procedure  . Angina   . Chest discomfort   . CHF (congestive heart failure) ([place])   . Coronary artery disease   . Coronary artery disease involving native coronary artery of native heart without angina pectoris   . Dialysis patient ([place])   . Dysrhythmia   . ESRD (end stage renal disease) ([place])    on hemodialysis [id number]  . Hypertension   . Leg pain   . Mitral regurgitation   . Morbid obesity ([place])   . Orthostatic hypotension   . Overweight(278.02)   . Renal insufficiency   . 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 Severe aortic insufficiency   . Sinus tachycardia   . Syncope     positional after dialysis... 2008    Past Surgical History:  Past Surgical History:  Procedure Laterality Date  . A/V FISTULAGRAM Left 06/30/2016   Procedure: A/V Fistulagram;  Surgeon: Algernon Huxley, MD;  Location: [facility];  Service: Cardiovascular;  Laterality: Left;  . AORTIC VALVE REPLACEMENT N/A 09/01/2017   Procedure: AORTIC VALVE REPLACEMENT (AVR);  Surgeon: [name], MD;  [facility];  Service: Open Heart Surgery;  Laterality: N/A;  . ARTERIOVENOUS GRAFT PLACEMENT    . AV FISTULA PLACEMENT    . AV FISTULA PLACEMENT Left 11/18/2015   Procedure: ARTERIOVENOUS (AV) FISTULA CREATION ( RADIOCEPHALIC );  Surgeon: Algernon Huxley, MD;  Location: [facility];  Service: Vascular;  Laterality: Left;  . AV FISTULA PLACEMENT Left 03/23/2016   Procedure: ARTERIOVENOUS (AV) FISTULA CREATION ( REVISION );  Surgeon: Algernon Huxley, MD;  Location: [facility];  Service: Vascular;  Laterality: Left;  . [place] REMOVAL Left 03/23/2016    Procedure: REMOVAL OF ARTERIOVENOUS GORETEX GRAFT ([place]);  Surgeon: Algernon Huxley, MD;  Location: [facility];  Service: Vascular;  Laterality: Left;  . CENTRAL LINE INSERTION Right 09/01/2017   Procedure: FLOROSCOPY GUIDED PLACEMENT OF RIGHT FEMORAL CENTRAL LINE TIMES TWO  AND PLACEMENT OF SWAN GANZ CATHETER;  Surgeon: [name], MD;  Location: [facility];  Service: Open Heart Surgery;  Laterality: Right;  . CORONARY ARTERY BYPASS GRAFT N/A 09/01/2017   Procedure: CORONARY ARTERY BYPASS GRAFTING (CABG) X 1, USING RIGHT GREATER SAPHENOUS VEIN HARVESTED ENDOSCOPICALLY;  Surgeon: [name], MD;  Location: [facility];  Service: Open Heart Surgery;  Laterality: N/A;  . DIALYSIS/PERMA CATHETER REMOVAL N/A 08/01/2016   Procedure: Dialysis/Perma Catheter Removal;  Surgeon: Algernon Huxley, MD;  Location: [facility];  Service: Cardiovascular;  Laterality: N/A;  . HERNIA REPAIR    . INSERTION OF DIALYSIS CATHETER  03/01/2011   Procedure: INSERTION OF DIALYSIS CATHETER;  Surgeon: Elam Dutch, MD;  Location: [facility];  Service: Vascular;  Laterality: Left;  Exchange of Dialysis Catheter to 27cm 15Fr. Arrow Catheter  . INSERTION OF DIALYSIS CATHETER  09/01/2017   Procedure: PLACEMENT OF TRIALYSIS SHORT TERM DIALYSIS CATHETER;  Surgeon: [name], MD;  Location: [facility];  Service: Open Heart Surgery;;  . MULTIPLE EXTRACTIONS WITH ALVEOLOPLASTY N/A 08/07/2017   Procedure: Extraction of tooth #2, 7,8,13,14,15,17, and 31 with alveoloplasty and gross debridement of remaining teeth;  Surgeon: [name], DDS;  Location: [facility];  Service: Oral Surgery;  Laterality: N/A;  . PERIPHERAL VASCULAR CATHETERIZATION N/A 09/01/2014   Procedure: A/V Shuntogram/Fistulagram;  Surgeon: Algernon Huxley, MD;  Location: [facility];  Service: Cardiovascular;  Laterality: N/A;  . PERIPHERAL VASCULAR CATHETERIZATION Right 09/01/2014   Procedure: Thrombectomy;  Surgeon: Algernon Huxley, MD;  Location: [facility];   Service: Cardiovascular;  Laterality: Right;  . PERIPHERAL VASCULAR CATHETERIZATION Right 09/01/2014   Procedure: A/V Shunt Intervention;  Surgeon: Algernon Huxley, MD;  Location: [facility];  Service: Cardiovascular;  Laterality: Right;  . PERIPHERAL VASCULAR CATHETERIZATION N/A 09/17/2014   Procedure: A/V Shuntogram/Fistulagram;  Surgeon: Algernon Huxley, MD;  Location: [place] [facility];  Service: Cardiovascular;  Laterality: N/A;  . PERIPHERAL VASCULAR CATHETERIZATION Right 10/17/2014   Procedure: Thrombectomy;  Surgeon: Katha Cabal, MD;  Location: [facility];  Service: Cardiovascular;  Laterality: Right;  . PERIPHERAL VASCULAR CATHETERIZATION N/A 10/17/2014   Procedure: A/V Shuntogram/Fistulagram;  Surgeon: Katha Cabal, MD;  Location: [facility];  Service: Cardiovascular;  Laterality: N/A;  . PERIPHERAL VASCULAR CATHETERIZATION N/A 10/17/2014   Procedure: A/V Shunt Intervention;  Surgeon: Katha Cabal, MD;  Location: [facility];  Service: Cardiovascular;  Laterality: N/A;  . PERIPHERAL VASCULAR CATHETERIZATION Right 11/04/2014   Procedure: Thrombectomy;  Surgeon: Katha Cabal, MD;  Location: [facility];  Service: Cardiovascular;  Laterality: Right;  . PERIPHERAL VASCULAR CATHETERIZATION Left 11/04/2014   Procedure: Visceral Venography;  Surgeon: Katha Cabal, MD;  Location: [facility];  Service: Cardiovascular;  Laterality: Left;  . PERIPHERAL VASCULAR CATHETERIZATION Right 11/27/2014   Procedure: A/V Shuntogram/Fistulagram;  Surgeon: Algernon Huxley, MD;  Location: [facility];  Service: Cardiovascular;  Laterality: Right;  . PERIPHERAL VASCULAR CATHETERIZATION Right 11/27/2014   Procedure: A/V Shunt Intervention;  Surgeon: Algernon Huxley, MD;  Location: [facility];  Service: Cardiovascular;  Laterality: Right;  . PERIPHERAL VASCULAR CATHETERIZATION Right 12/31/2014   Procedure: Thrombectomy;  Surgeon: Algernon Huxley, MD;  Location: [facility];  Service: Cardiovascular;  Laterality: Right;  . PERIPHERAL VASCULAR CATHETERIZATION Right 01/12/2015   Procedure: A/V Shuntogram/Fistulagram;  Surgeon: Algernon Huxley, MD;  Location: [facility];  Service: Cardiovascular;  Laterality: Right;  . PERIPHERAL VASCULAR CATHETERIZATION N/A 01/12/2015   Procedure: A/V Shunt Intervention;  Surgeon: Algernon Huxley, MD;  Location: [facility];  Service: Cardiovascular;  Laterality: N/A;  . PERIPHERAL VASCULAR CATHETERIZATION Right 01/28/2015   Procedure: Thrombectomy;  Surgeon: Algernon Huxley, MD;  Location: [facility];  Service: Cardiovascular;  Laterality: Right;  . PERIPHERAL VASCULAR CATHETERIZATION N/A 06/08/2015   Procedure: A/V Shuntogram/Fistulagram;  Surgeon: Algernon Huxley, MD;  Location: [facility];  Service: Cardiovascular;  Laterality: N/A;  . PERIPHERAL VASCULAR CATHETERIZATION N/A 06/08/2015   Procedure: A/V Shunt Intervention;  Surgeon: Algernon Huxley, MD;  Location: [facility];  Service: Cardiovascular;  Laterality: N/A;  . PERIPHERAL VASCULAR CATHETERIZATION Right 07/06/2015   Procedure: A/V Shuntogram/Fistulagram;  Surgeon: Algernon Huxley, MD;  Location: [facility];  Service: Cardiovascular;  Laterality: Right;  . PERIPHERAL VASCULAR CATHETERIZATION N/A 07/06/2015   Procedure: A/V Shunt Intervention;  Surgeon: Algernon Huxley, MD;  Location: [facility];  Service: Cardiovascular;  Laterality: N/A;  . PERIPHERAL VASCULAR CATHETERIZATION N/A 08/04/2015   Procedure: graft declot;  Surgeon: Katha Cabal, MD;  Location: [facility];  Service: Cardiovascular;  Laterality: N/A;  . PERIPHERAL VASCULAR CATHETERIZATION Right 08/25/2015   Procedure: A/V Shuntogram/Fistulagram;  Surgeon: Katha Cabal, MD;  Location: [facility];  Service: Cardiovascular;  Laterality: Right;  . PERIPHERAL VASCULAR CATHETERIZATION N/A 11/04/2015   Procedure:  Dialysis/Perma Catheter Insertion;  Surgeon: Algernon Huxley, MD;  Location: [place] [facility];  Service: Cardiovascular;  Laterality: N/A;  . PERIPHERAL VASCULAR CATHETERIZATION Left 12/14/2015   Procedure: A/V Shuntogram/Fistulagram;  Surgeon: Algernon Huxley, MD;  Location: [place] [facility];  Service: Cardiovascular;  Laterality: Left;  . PERIPHERAL VASCULAR CATHETERIZATION N/A 12/14/2015   Procedure: A/V Shunt Intervention;  Surgeon: Algernon Huxley, MD;  Location: [facility];  Service: Cardiovascular;  Laterality: N/A;  . PERIPHERAL VASCULAR CATHETERIZATION N/A 12/17/2015   Procedure: Dialysis/Perma Catheter Insertion;  Surgeon: Algernon Huxley, MD;  Location: [facility];  Service: Cardiovascular;  Laterality: N/A;  . PERIPHERAL VASCULAR CATHETERIZATION Left 12/22/2015   Procedure: Dialysis/Perma Catheter Insertion;  Surgeon: Katha Cabal, MD;  Location: [facility];  Service: Cardiovascular;  Laterality: Left;  . PERIPHERAL VASCULAR CATHETERIZATION Left 02/29/2016   Procedure: A/V Shuntogram/Fistulagram;  Surgeon: Algernon Huxley, MD;  Location: [facility];  Service: Cardiovascular;  Laterality: Left;  . PERIPHERAL VASCULAR CATHETERIZATION N/A 02/29/2016   Procedure: A/V Shunt Intervention;  Surgeon: Erskine Squibb  Lucky Cowboy, MD;  Location: [facility];  Service: Cardiovascular;  Laterality: N/A;  . POCKET REVISION/RELOCATION N/A 01/01/2018   Procedure: POCKET REVISION/RELOCATION;  Surgeon: Evans Lance, MD;  Location: [facility];  Service: Cardiovascular;  Laterality: N/A;  . right arm graft     for dyalisis  . RIGHT/LEFT HEART CATH AND CORONARY ANGIOGRAPHY N/A 08/02/2017   Procedure: RIGHT/LEFT HEART CATH AND CORONARY ANGIOGRAPHY;  Surgeon: Larey Dresser, MD;  Location: [facility];  Service: Cardiovascular;  Laterality: N/A;  . SUBQ ICD IMPLANT N/A 11/28/2017   Procedure: SUBQ ICD IMPLANT;  Surgeon: Evans Lance, MD;  Location: [facility];  Service: Cardiovascular;  Laterality: N/A;  . SUBQ ICD REVISION N/A 02/12/2018   Procedure: SUBQ ICD REVISION(REMOVAL);  Surgeon: Evans Lance, MD;  Location: [facility];  Service: Cardiovascular;  Laterality: N/A;  . TEE WITHOUT CARDIOVERSION N/A 07/31/2017   Procedure: TRANSESOPHAGEAL ECHOCARDIOGRAM (TEE);  Surgeon: Josue Hector, MD;  Location[facility];  Service: Cardiovascular;  Laterality: N/A;  . TEE WITHOUT CARDIOVERSION N/A 09/01/2017   Procedure: TRANSESOPHAGEAL ECHOCARDIOGRAM (TEE);  Surgeon: [name], MD;  Location: [place];  Service: Open Heart Surgery;  Laterality: N/A;  . THROMBECTOMY    . THROMBECTOMY W/ EMBOLECTOMY  03/01/2011   Procedure: THROMBECTOMY ARTERIOVENOUS GORE-TEX GRAFT;  Surgeon: Elam Dutch, MD;  Location: [place];  Service: Vascular;  Laterality: Right;  Attempted Thrombectomy of Old  Right Upper Arm Arteriovenous gortex Graft. Insertion of new Arteriovenous Graft using 82mm x 50cm Gortex Stretch graft.   . THROMBECTOMY W/ EMBOLECTOMY  07/11/2011   Procedure: THROMBECTOMY ARTERIOVENOUS GORE-TEX GRAFT;  Surgeon: Rosetta Posner, MD;  Location: [facility];  Service: Vascular;  Laterality: Right;  . UMBILICAL HERNIA REPAIR    . VENOGRAM N/A 08/23/2011   Procedure: VENOGRAM;  Surgeon: [name], MD;  Location[facility];  Service: Cardiovascular;  Laterality: N/A;    Medication: No current facility-administered medications for this encounter.    Current Outpatient Medications  Medication Sig Dispense Refill  . amiodarone (PACERONE) 200 MG tablet Take 1 tablet (200 mg total) by mouth daily. 90 tablet 3  . atorvastatin (LIPITOR) 80 MG tablet Take 1 tablet (80 mg total) by mouth daily at 6 PM. 90 tablet 3  . B Complex-C-Folic Acid (NEPHRO-VITE PO) Take 1 tablet by mouth daily.     [name] FOSRENOL 500 MG chewable tablet Chew 1,500-2,000 mg by mouth See admin instructions. Take 2000 mg by mouth with meals and take 1500 mg by mouth with snacks    .  midodrine (PROAMATINE) 5 MG tablet Take 3 tablets (15 mg total) by mouth 3 (three) times daily with meals. 810 tablet 3  . warfarin (COUMADIN) 5 MG tablet Take 1 tablet daily except 1/2 tablet on Wednesdays and Sundays or as directed by coumadin clinic 90 tablet 1    Allergies: No Known Allergies  Social History: Social History   Tobacco Use  . Smoking status: Former Smoker    Types: Cigarettes    Last attempt to quit: 04/18/1994    Years since quitting: 24.3  . Smokeless tobacco: Current User    Types: Snuff  . Tobacco comment: dips 1/2 snuff per day times 25 years  Substance Use Topics  . Alcohol use: No  . Drug use: No    Family History Family History  Problem Relation Age of Onset  . Hypertension Mother   . Heart  disease Mother   . Hypertension Father   . Heart disease Father     Review of Systems 10 systems were reviewed and are negative except as noted specifically in the HPI.  Objective   Vital signs in last 24 hours: BP (!) 110/34   Pulse (!) 118   Temp 99.6 F (37.6 C) (Oral)   Resp 15   Ht 6\' 1"  (1.854 m)   Wt 132 kg   SpO2 100%   BMI 38.39 kg/m   Physical Exam General: NAD, A&O, resting, appropriate HEENT: [place]/AT, EOMI, MMM Pulmonary: Normal work of breathing Cardiovascular: HDS, adequate peripheral perfusion Abdomen: Soft, TTP in LUQ and left back. Palpable left kidney. Mild discomfort in the RUQ.  GU: uncircumcised phallus with foreskin easily retracted. Small amount of blood on foreskin when retracted, but no active bleeding from urethra visualized.  Extremities: warm and well perfused Neuro: Appropriate, no focal neurological deficits  Most Recent Labs: Lab Results  Component Value Date   WBC 19.1 (H) 08/19/2018   HGB 7.6 (L) 08/19/2018   HCT 25.5 (L) 08/19/2018   PLT 636 (H) 08/19/2018    Lab Results  Component Value Date   NA 139 08/19/2018   K 4.4 08/19/2018   CL 102 08/19/2018   CO2 21 (L) 08/19/2018   BUN 61 (H) 08/19/2018    CREATININE 10.60 (H) 08/19/2018   CALCIUM 8.3 (L) 08/19/2018   MG 2.0 08/06/2018   PHOS 5.7 (H) 08/06/2018    Lab Results  Component Value Date   INR 5.5 (HH) 08/19/2018   APTT 73 (H) 08/19/2018     IMAGING: No results found.  ------  Assessment:  53 y.o. 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 This hematoma has increased in size since his repeat imaging on 08/04/18, consistent with recurrent bleeding in the setting of a supratherapeutic INR. As he is hemodynamically stable and bleeding will likely subside once INR decreases into therapeutic range, we would favor conservative management for this initially.   Recommendations: - Agree with hospital admission to a medical service for monitoring and serial lab work. - Recommend serial hemoglobins, vital signs, and transfusion as needed. - Recommend 24 hours of bedrest.  - If okay with cardiology in the setting of a mechanical valve, consider holding coumadin (and starting IV heparin as needed) given supratherapeutic INR until hemoglobin stabilizes.  - If patient deteriorates acutely, or if worsening Hgb trend, will likely need to consult VIR for possible angiography / embolization.  - No strong indication for urethral catheter at this time as patient denies significant urinary complaints. Discussed possible placement if patient develops urinary complaints. He does have intravesical clot although it is okay for bladder to clot and reabsorb, as he is completely anuric at baseline - Patient will need to follow-up with urology as an outpatient for further imaging to ensure no underlying malignant renal mass as source of bleed.    Thank you for this consult. Please contact the urology consult pager with any further questions/concerns.  Discussed with Dr. Louis Meckel.

## 2018-08-19 NOTE — ED Provider Notes (Signed)
Care of the patient assumed from Dr. Kathrynn Humble. We are awaiting urology evaluation. CT results from outside hospital were difficult to obtain, as there was technology difficulty, but interpretation is included below:   5:49 PM Continues to have pain but is awake, alert, denies fever, denies cough. We reviewed findings thus far, need for admission.  On subsequently discussed his case with the internal medicine teaching service, who recently had care of the patient, during his hospitalization. I have also discussed patient's case with our urology colleagues again, and they will evaluate the patient, follow as a consulting service.   Carmin Muskrat, MD 08/19/18 [phone]

## 2018-08-19 NOTE — ED Notes (Signed)
ED TO INPATIENT HANDOFF REPORT  ED Nurse Name and Phone #: Burman Nieves [phone]  S Name/Age/Gender [name] [name] 53 y.o. male Room/Bed: 016C/016C  Code Status   Code Status: Full Code  Home/SNF/Other Home Patient oriented to: self, place, time and situation Is this baseline? Yes   Triage Complete: Triage complete  Chief Complaint Tranfer  Triage Note Pt transported form [facility] with comp[aints of abdominal pain and bleeding out his penis. Pt states every time he urinates he bleeds. Dialysis treatment 08/16/18. Car link gave 4mg  of Morphine at 1330 for pain. Pt endorces 10/10 pain at this time   Allergies No Known Allergies  Level of Care/Admitting Diagnosis ED Disposition    ED Disposition Condition Comment   Admit  Hospital Area: [facility] [[id number]]  Level of Care: Telemetry Medical [104]  Covid Evaluation: N/A  Diagnosis: Gross hematuria [599.71.ICD-9-CM]  Admitting Physician: Oda Kilts [[id number]]  Attending Physician: Oda Kilts [[id number]]  Estimated length of stay: past midnight tomorrow  Certification:: I certify this patient will need inpatient services for at least 2 midnights  PT Class (Do Not Modify): Inpatient [101]  PT Acc Code (Do Not Modify): Private [1]       B Medical/Surgery History Past Medical History:  Diagnosis Date  . Aneurysm ([place])     Right arm fistula 3 aneurysms 2011,   plans to have a new procedure  . Angina   . Chest discomfort   . CHF (congestive heart failure) ([place])   . Coronary artery disease   . Coronary artery disease involving native coronary artery of native heart without angina pectoris   . Dialysis patient ([place])   . Dysrhythmia   . ESRD (end stage renal disease) ([place])    on hemodialysis T_T_S  . Hypertension   . Leg pain   . Mitral regurgitation   . Morbid obesity ([place])   . Orthostatic hypotension   . Overweight(278.02)   . Renal insufficiency   . 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 Severe aortic insufficiency   . Sinus tachycardia   . Syncope     positional after dialysis... 2008   Past Surgical History:  Procedure Laterality Date  . A/V FISTULAGRAM Left 06/30/2016   Procedure: A/V Fistulagram;  Surgeon: Algernon Huxley, MD;  Location: [facility];  Service: Cardiovascular;  Laterality: Left;  . AORTIC VALVE REPLACEMENT N/A 09/01/2017   Procedure: AORTIC VALVE REPLACEMENT (AVR);  Surgeon: [name], MD;  Location: [facility];  Service: Open Heart Surgery;  Laterality: N/A;  . ARTERIOVENOUS GRAFT PLACEMENT    . AV FISTULA PLACEMENT    . AV FISTULA PLACEMENT Left 11/18/2015   Procedure: ARTERIOVENOUS (AV) FISTULA CREATION ( RADIOCEPHALIC );  Surgeon: Algernon Huxley, MD;  Location: [facility];  Service: Vascular;  Laterality: Left;  . AV FISTULA PLACEMENT Left 03/23/2016   Procedure: ARTERIOVENOUS (AV) FISTULA CREATION ( REVISION );  Surgeon: Algernon Huxley, MD;  Location: [facility];  Service: Vascular;  Laterality: Left;  . [place] REMOVAL Left 03/23/2016   Procedure: REMOVAL OF ARTERIOVENOUS GORETEX GRAFT ([place]);  Surgeon: Algernon Huxley, MD;  Location: [facility];  Service: Vascular;  Laterality: Left;  . CENTRAL LINE INSERTION Right 09/01/2017   Procedure: FLOROSCOPY GUIDED PLACEMENT OF RIGHT FEMORAL CENTRAL LINE TIMES TWO  AND PLACEMENT  OF SWAN GANZ CATHETER;  Surgeon: [name], MD;  Location: [facility];  Service: Open Heart Surgery;  Laterality: Right;  . CORONARY ARTERY BYPASS GRAFT N/A 09/01/2017   Procedure: CORONARY ARTERY BYPASS GRAFTING (CABG) X 1, USING RIGHT GREATER SAPHENOUS VEIN HARVESTED ENDOSCOPICALLY;  Surgeon: [name], MD;  Location: [facility];  Service: Open Heart Surgery;  Laterality: N/A;  . DIALYSIS/PERMA CATHETER REMOVAL N/A 08/01/2016   Procedure: Dialysis/Perma Catheter Removal;  Surgeon: Algernon Huxley, MD;  Location:  [facility];  Service: Cardiovascular;  Laterality: N/A;  . HERNIA REPAIR    . INSERTION OF DIALYSIS CATHETER  03/01/2011   Procedure: INSERTION OF DIALYSIS CATHETER;  Surgeon: Elam Dutch, MD;  Location: [facility];  Service: Vascular;  Laterality: Left;  Exchange of Dialysis Catheter to 27cm 15Fr. Arrow Catheter  . INSERTION OF DIALYSIS CATHETER  09/01/2017   Procedure: PLACEMENT OF TRIALYSIS SHORT TERM DIALYSIS CATHETER;  Surgeon: [name], MD;  Location: [facility];  Service: Open Heart Surgery;;  . MULTIPLE EXTRACTIONS WITH ALVEOLOPLASTY N/A 08/07/2017   Procedure: Extraction of tooth #2, 7,8,13,14,15,17, and 31 with alveoloplasty and gross debridement of remaining teeth;  Surgeon: [name], DDS;  Location: [facility];  Service: Oral Surgery;  Laterality: N/A;  . PERIPHERAL VASCULAR CATHETERIZATION N/A 09/01/2014   Procedure: A/V Shuntogram/Fistulagram;  Surgeon: Algernon Huxley, MD;  Location: [facility];  Service: Cardiovascular;  Laterality: N/A;  . PERIPHERAL VASCULAR CATHETERIZATION Right 09/01/2014   Procedure: Thrombectomy;  Surgeon: Algernon Huxley, MD;  Location: [facility];  Service: Cardiovascular;  Laterality: Right;  . PERIPHERAL VASCULAR CATHETERIZATION Right 09/01/2014   Procedure: A/V Shunt Intervention;  Surgeon: Algernon Huxley, MD;  Location: [facility];  Service: Cardiovascular;  Laterality: Right;  . PERIPHERAL VASCULAR CATHETERIZATION N/A 09/17/2014   Procedure: A/V Shuntogram/Fistulagram;  Surgeon: Algernon Huxley, MD;  Location: [facility];  Service: Cardiovascular;  Laterality: N/A;  . PERIPHERAL VASCULAR CATHETERIZATION Right 10/17/2014   Procedure: Thrombectomy;  Surgeon: Katha Cabal, MD;  Location: [facility];  Service: Cardiovascular;  Laterality: Right;  . PERIPHERAL VASCULAR CATHETERIZATION N/A 10/17/2014   Procedure: A/V Shuntogram/Fistulagram;  Surgeon: Katha Cabal, MD;  Location: [facility];   Service: Cardiovascular;  Laterality: N/A;  . PERIPHERAL VASCULAR CATHETERIZATION N/A 10/17/2014   Procedure: A/V Shunt Intervention;  Surgeon: Katha Cabal, MD;  Location: [facility];  Service: Cardiovascular;  Laterality: N/A;  . PERIPHERAL VASCULAR CATHETERIZATION Right 11/04/2014   Procedure: Thrombectomy;  Surgeon: Katha Cabal, MD;  Location: [facility];  Service: Cardiovascular;  Laterality: Right;  . PERIPHERAL VASCULAR CATHETERIZATION Left 11/04/2014   Procedure: Visceral Venography;  Surgeon: Katha Cabal, MD;  Location: [facility];  Service: Cardiovascular;  Laterality: Left;  . PERIPHERAL VASCULAR CATHETERIZATION Right 11/27/2014   Procedure: A/V Shuntogram/Fistulagram;  Surgeon: Algernon Huxley, MD;  Location: [facility];  Service: Cardiovascular;  Laterality: Right;  . PERIPHERAL VASCULAR CATHETERIZATION Right 11/27/2014   Procedure: A/V Shunt Intervention;  Surgeon: Algernon Huxley, MD;  Location: [facility];  Service: Cardiovascular;  Laterality: Right;  . PERIPHERAL VASCULAR CATHETERIZATION Right 12/31/2014   Procedure: Thrombectomy;  Surgeon: Algernon Huxley, MD;  Location: [facility];  Service: Cardiovascular;  Laterality: Right;  . PERIPHERAL VASCULAR CATHETERIZATION Right 01/12/2015   Procedure: A/V Shuntogram/Fistulagram;  Surgeon: Algernon Huxley, MD;  Location:  [facility];  Service: Cardiovascular;  Laterality: Right;  . PERIPHERAL VASCULAR CATHETERIZATION N/A 01/12/2015   Procedure: A/V Shunt Intervention;  Surgeon: Algernon Huxley, MD;  Location: [facility];  Service: Cardiovascular;  Laterality: N/A;  . PERIPHERAL VASCULAR CATHETERIZATION Right 01/28/2015   Procedure: Thrombectomy;  Surgeon: Algernon Huxley, MD;  Location: [facility];  Service: Cardiovascular;  Laterality: Right;  . PERIPHERAL VASCULAR CATHETERIZATION N/A 06/08/2015   Procedure: A/V Shuntogram/Fistulagram;  Surgeon: Algernon Huxley, MD;   Location: [facility];  Service: Cardiovascular;  Laterality: N/A;  . PERIPHERAL VASCULAR CATHETERIZATION N/A 06/08/2015   Procedure: A/V Shunt Intervention;  Surgeon: Algernon Huxley, MD;  Location: [facility];  Service: Cardiovascular;  Laterality: N/A;  . PERIPHERAL VASCULAR CATHETERIZATION Right 07/06/2015   Procedure: A/V Shuntogram/Fistulagram;  Surgeon: Algernon Huxley, MD;  Location: [facility];  Service: Cardiovascular;  Laterality: Right;  . PERIPHERAL VASCULAR CATHETERIZATION N/A 07/06/2015   Procedure: A/V Shunt Intervention;  Surgeon: Algernon Huxley, MD;  Location: [facility];  Service: Cardiovascular;  Laterality: N/A;  . PERIPHERAL VASCULAR CATHETERIZATION N/A 08/04/2015   Procedure: graft declot;  Surgeon: Katha Cabal, MD;  Location: [facility];  Service: Cardiovascular;  Laterality: N/A;  . PERIPHERAL VASCULAR CATHETERIZATION Right 08/25/2015   Procedure: A/V Shuntogram/Fistulagram;  Surgeon: Katha Cabal, MD;  Location: [facility];  Service: Cardiovascular;  Laterality: Right;  . PERIPHERAL VASCULAR CATHETERIZATION N/A 11/04/2015   Procedure: Dialysis/Perma Catheter Insertion;  Surgeon: Algernon Huxley, MD;  Location: [facility];  Service: Cardiovascular;  Laterality: N/A;  . PERIPHERAL VASCULAR CATHETERIZATION Left 12/14/2015   Procedure: A/V Shuntogram/Fistulagram;  Surgeon: Algernon Huxley, MD;  Location: [facility];  Service: Cardiovascular;  Laterality: Left;  . PERIPHERAL VASCULAR CATHETERIZATION N/A 12/14/2015   Procedure: A/V Shunt Intervention;  Surgeon: Algernon Huxley, MD;  Location: [facility];  Service: Cardiovascular;  Laterality: N/A;  . PERIPHERAL VASCULAR CATHETERIZATION N/A 12/17/2015   Procedure: Dialysis/Perma Catheter Insertion;  Surgeon: Algernon Huxley, MD;  Location: [facility];  Service: Cardiovascular;  Laterality: N/A;  . PERIPHERAL VASCULAR CATHETERIZATION Left 12/22/2015    Procedure: Dialysis/Perma Catheter Insertion;  Surgeon: Katha Cabal, MD;  Location: [facility];  Service: Cardiovascular;  Laterality: Left;  . PERIPHERAL VASCULAR CATHETERIZATION Left 02/29/2016   Procedure: A/V Shuntogram/Fistulagram;  Surgeon: Algernon Huxley, MD;  Location: [facility];  Service: Cardiovascular;  Laterality: Left;  . PERIPHERAL VASCULAR CATHETERIZATION N/A 02/29/2016   Procedure: A/V Shunt Intervention;  Surgeon: Algernon Huxley, MD;  Location: [facility];  Service: Cardiovascular;  Laterality: N/A;  . POCKET REVISION/RELOCATION N/A 01/01/2018   Procedure: POCKET REVISION/RELOCATION;  Surgeon: Evans Lance, MD;  Location: [facility];  Service: Cardiovascular;  Laterality: N/A;  . right arm graft     for dyalisis  . RIGHT/LEFT HEART CATH AND CORONARY ANGIOGRAPHY N/A 08/02/2017   Procedure: RIGHT/LEFT HEART CATH AND CORONARY ANGIOGRAPHY;  Surgeon: Larey Dresser, MD;  Location: [facility];  Service: Cardiovascular;  Laterality: N/A;  . SUBQ ICD IMPLANT N/A 11/28/2017   Procedure: SUBQ ICD IMPLANT;  Surgeon: Evans Lance, MD;  Location: [facility];  Service: Cardiovascular;  Laterality: N/A;  . SUBQ ICD REVISION N/A 02/12/2018   Procedure: SUBQ ICD REVISION(REMOVAL);  Surgeon: Evans Lance, MD;  Location: [facility];  Service: Cardiovascular;  Laterality: N/A;  . TEE WITHOUT CARDIOVERSION N/A 07/31/2017   Procedure: TRANSESOPHAGEAL ECHOCARDIOGRAM (TEE);  Surgeon: Josue Hector, MD;  Location: [facility];  Service: Cardiovascular;  Laterality: N/A;  . TEE WITHOUT CARDIOVERSION N/A 09/01/2017   Procedure: TRANSESOPHAGEAL ECHOCARDIOGRAM (TEE);  Surgeon: [name], MD;  Location: [facility];  Service: Open Heart Surgery;  Laterality: N/A;  . THROMBECTOMY    . THROMBECTOMY W/ EMBOLECTOMY  03/01/2011   Procedure: THROMBECTOMY ARTERIOVENOUS GORE-TEX GRAFT;  Surgeon: Elam Dutch, MD;  Location: [facility];  Service:  Vascular;  Laterality: Right;  Attempted Thrombectomy of Old  Right Upper Arm Arteriovenous gortex Graft. Insertion of new Arteriovenous Graft using 8mm x 50cm Gortex Stretch graft.   . THROMBECTOMY W/ EMBOLECTOMY  07/11/2011   Procedure: THROMBECTOMY ARTERIOVENOUS GORE-TEX GRAFT;  Surgeon: Rosetta Posner, MD;  Location: [facility];  Service: Vascular;  Laterality: Right;  . UMBILICAL HERNIA REPAIR    . VENOGRAM N/A 08/23/2011   Procedure: VENOGRAM;  Surgeon: [name], MD;  Location: [facility] LAB;  Service: Cardiovascular;  Laterality: N/A;     A IV Location/Drains/Wounds Patient Lines/Drains/Airways Status   Active Line/Drains/Airways    Name:   Placement date:   Placement time:   Site:   Days:   Peripheral IV 08/19/18 Right Forearm   08/19/18    1500    Forearm   less than 1   Fistula / Graft Left Forearm Arteriovenous fistula   04/06/12    1738    Forearm   2326   Vascular Access Right Internal jugular Hemodialysis catheter   08/08/12    1621    Internal jugular   2202   Fistula / Graft Right Upper arm Arteriovenous fistula   -    -    Upper arm      Fistula / Graft Right Upper arm   06/08/15    -    Upper arm   1168   Fistula / Graft Left Forearm   -    -    Forearm      Fistula / Graft Left Forearm   -    -    Forearm      Fistula / Graft Left Forearm Arteriovenous fistula   -    -    Forearm      Fistula / Graft Left Forearm   07/30/17    2039    Forearm   385   Post Cath / Sheath 06/08/15 Right Venous   06/08/15    1600    Venous   1168   Post Cath / Sheath 06/08/15 Right Venous   06/08/15    1602    Venous   1168   Post Cath / Sheath 07/06/15 Right Venous   07/06/15    1641    Venous   1140   Post Cath / Sheath 07/06/15 Right Venous   07/06/15    1643    Venous   1140   Post Cath / Sheath 08/25/15 Right    08/25/15    1530    -   1090   Post Cath / Sheath 12/14/15 Left   12/14/15    1434    -   979   Post Cath / Sheath 02/29/16 Left Venous   02/29/16    1151    Venous   902   Sheath  06/30/16 Left Venous;Brachial   06/30/16  1144    Venous;Brachial   780   Incision (Closed) 08/07/17 N/A   08/07/17    1131     377   Incision (Closed) 09/01/17 Chest Other (Comment)   09/01/17    1413     352   Incision (Closed) 09/01/17 Leg Right   09/01/17    1413     352   Incision (Closed) 02/12/18 Chest Right;Left;Medial;Mid;Upper;Lower   02/12/18    1830     188   Wound / Incision (Open or Dehisced) 08/01/18 Incision - Open Chest Lower   08/01/18    1732    Chest   18          Intake/Output Last 24 hours No intake or output data in the 24 hours ending 08/19/18 1818  Labs/Imaging Results for orders placed or performed during the hospital encounter of 08/19/18 (from the past 48 hour(s))  Comprehensive metabolic panel     Status: Abnormal   Collection Time: 08/19/18  3:13 PM  Result Value Ref Range   Sodium 139 135 - 145 mmol/L   Potassium 4.4 3.5 - 5.1 mmol/L   Chloride 102 98 - 111 mmol/L   CO2 21 (L) 22 - 32 mmol/L   Glucose, Bld 101 (H) 70 - 99 mg/dL   BUN 61 (H) 6 - 20 mg/dL   Creatinine, Ser 10.60 (H) 0.61 - 1.24 mg/dL   Calcium 8.3 (L) 8.9 - 10.3 mg/dL   Total Protein 7.4 6.5 - 8.1 g/dL   Albumin 2.1 (L) 3.5 - 5.0 g/dL   AST 49 (H) 15 - 41 U/L   ALT 23 0 - 44 U/L   Alkaline Phosphatase 196 (H) 38 - 126 U/L   Total Bilirubin 1.3 (H) 0.3 - 1.2 mg/dL   GFR calc non Af Amer 5 (L) >60 mL/min   GFR calc Af Amer 6 (L) >60 mL/min   Anion gap 16 (H) 5 - 15    Comment: Performed at [facility], [address] [place], [place] [address]  CBC with Differential     Status: Abnormal   Collection Time: 08/19/18  3:13 PM  Result Value Ref Range   WBC 19.1 (H) 4.0 - 10.5 K/uL   RBC 2.96 (L) 4.22 - 5.81 MIL/uL   Hemoglobin 7.6 (L) 13.0 - 17.0 g/dL   HCT 25.5 (L) 39.0 - 52.0 %   MCV 86.1 80.0 - 100.0 fL   MCH 25.7 (L) 26.0 - 34.0 pg   MCHC 29.8 (L) 30.0 - 36.0 g/dL   RDW 16.7 (H) 11.5 - 15.5 %   Platelets 636 (H) 150 - 400 K/uL   nRBC 0.0 0.0 - 0.2 %   Neutrophils  Relative % 90 %   Neutro Abs 17.1 (H) 1.7 - 7.7 K/uL   Lymphocytes Relative 3 %   Lymphs Abs 0.6 (L) 0.7 - 4.0 K/uL   Monocytes Relative 6 %   Monocytes Absolute 1.2 (H) 0.1 - 1.0 K/uL   Eosinophils Relative 0 %   Eosinophils Absolute 0.1 0.0 - 0.5 K/uL   Basophils Relative 0 %   Basophils Absolute 0.1 0.0 - 0.1 K/uL   Immature Granulocytes 1 %   Abs Immature Granulocytes 0.11 (H) 0.00 - 0.07 K/uL    Comment: Performed at [facility] [address], [place],  [address]  Protime-INR     Status: Abnormal   Collection Time: 08/19/18  3:13 PM  Result Value Ref Range  Prothrombin Time 49.1 (H) 11.4 - 15.2 seconds   INR 5.5 (HH) 0.8 - 1.2    Comment: REPEATED TO VERIFY CRITICAL RESULT CALLED TO, READ BACK BY AND VERIFIED WITH: MAGGIE Klarisa Barman [email or web address]  ON 5.3.2020 BY TCALDWELL MT. (NOTE) INR goal varies based on device and disease states. Performed at [facility], [place] [address] [place], [place] [address]   APTT     Status: Abnormal   Collection Time: 08/19/18  3:13 PM  Result Value Ref Range   aPTT 73 (H) 24 - 36 seconds    Comment:        IF BASELINE aPTT IS ELEVATED, SUGGEST PATIENT RISK ASSESSMENT BE USED TO DETERMINE APPROPRIATE ANTICOAGULANT THERAPY. Performed at [facility], [place] [address] [place], [place] [address]   Type and screen     Status: None   Collection Time: 08/19/18  3:13 PM  Result Value Ref Range   ABO/RH(D) B POS    Antibody Screen NEG    Sample Expiration      08/22/2018 Performed at [facility], [place] [address] [place], [place] [address]   SARS Coronavirus 2 (CEPHEID - Performed in [facility] hospital lab), Hosp Order     Status: None   Collection Time: 08/19/18  5:11 PM  Result Value Ref Range   SARS Coronavirus 2 NEGATIVE NEGATIVE    Comment: (NOTE) If result is NEGATIVE SARS-CoV-2 target nucleic acids are NOT DETECTED. The SARS-CoV-2 RNA is generally detectable in upper and lower  respiratory specimens  during the acute phase of infection. The lowest  concentration of SARS-CoV-2 viral copies this assay can detect is 250  copies / mL. A negative result does not preclude SARS-CoV-2 infection  and should not be used as the sole basis for treatment or other  patient management decisions.  A negative result may occur with  improper specimen collection / handling, submission of specimen other  than nasopharyngeal swab, presence of viral mutation(s) within the  areas targeted by this assay, and inadequate number of viral copies  (<250 copies / mL). A negative result must be combined with clinical  observations, patient history, and epidemiological information. If result is POSITIVE SARS-CoV-2 target nucleic acids are DETECTED. The SARS-CoV-2 RNA is generally detectable in upper and lower  respiratory specimens dur ing the acute phase of infection.  Positive  results are indicative of active infection with SARS-CoV-2.  Clinical  correlation with patient history and other diagnostic information is  necessary to determine patient infection status.  Positive results do  not rule out bacterial infection or co-infection with other viruses. If result is PRESUMPTIVE POSTIVE SARS-CoV-2 nucleic acids MAY BE PRESENT.   A presumptive positive result was obtained on the submitted specimen  and confirmed on repeat testing.  While 2019 novel coronavirus  (SARS-CoV-2) nucleic acids may be present in the submitted sample  additional confirmatory testing may be necessary for epidemiological  and / or clinical management purposes  to differentiate between  SARS-CoV-2 and other Sarbecovirus currently known to infect humans.  If clinically indicated additional testing with an alternate test  methodology [phone]) is advised. The SARS-CoV-2 RNA is generally  detectable in upper and lower respiratory sp ecimens during the acute  phase of infection. The expected result is Negative. Fact Sheet for Patients:   StrictlyIdeas.no Fact Sheet for Healthcare Providers: BankingDealers.co.za This test is not yet approved or cleared by the [place] FDA and has been authorized for detection and/or diagnosis of  SARS-CoV-2 by FDA under an Emergency Use Authorization (EUA).  This EUA will remain in effect (meaning this test can be used) for the duration of the COVID-19 declaration under Section 564(b)(1) of the Act, 21 U.S.C. section 360bbb-3(b)(1), unless the authorization is terminated or revoked sooner. Performed at [facility], [place] [address] [place], [place] [address]    No results found.  Pending Labs Unresulted Labs (From admission, onward)    Start     Ordered   08/20/18 0500  Protime-INR  Tomorrow morning,   R     08/19/18 1727   08/20/18 0500  CBC  Tomorrow morning,   R     08/19/18 1727   08/20/18 0092  Basic metabolic panel  Tomorrow morning,   R     08/19/18 1727   08/19/18 1522  Urinalysis, Routine w reflex microscopic  ONCE - STAT,   STAT     08/19/18 1521   08/19/18 1522  Urine culture  ONCE - STAT,   STAT     08/19/18 1521          Vitals/Pain Today's Vitals   08/19/18 1715 08/19/18 1730 08/19/18 1745 08/19/18 1807  BP: 106/67 107/71 98/69   Pulse: (!) 115  (!) 111   Resp: 17 (!) 22 (!) 25   Temp:      TempSrc:      SpO2: 99%  98%   Weight:      Height:      PainSc:    7     Isolation Precautions No active isolations  Medications Medications  acetaminophen (TYLENOL) tablet 650 mg (has no administration in time range)    Or  acetaminophen (TYLENOL) suppository 650 mg (has no administration in time range)  ondansetron (ZOFRAN) tablet 4 mg (has no administration in time range)    Or  ondansetron (ZOFRAN) injection 4 mg (has no administration in time range)  amiodarone (PACERONE) tablet 200 mg (has no administration in time range)  atorvastatin (LIPITOR) tablet 80 mg (has no administration in time range)   midodrine (PROAMATINE) tablet 15 mg (has no administration in time range)  lanthanum (FOSRENOL) chewable tablet 1,500-2,000 mg (has no administration in time range)  fentaNYL (SUBLIMAZE) injection 50 mcg (50 mcg Intravenous Given 08/19/18 1535)  fentaNYL (SUBLIMAZE) injection 100 mcg (100 mcg Intravenous Given 08/19/18 1708)    Mobility walks with device Low fall risk   Focused Assessments Renal Assessment Handoff:  Hemodialysis Schedule: Hemodialysis Schedule: Tuesday/Thursday/Saturday Last Hemodialysis date and time: Thursday   Restricted appendage: left arm     R Recommendations: See Admitting Provider Note  Report given to:   Additional Notes:  Urology to see pt

## 2018-08-19 NOTE — Progress Notes (Signed)
New Admission Note: Patient Admitted from the [facility] ED to room 5M19  Arrival Method: via stretcher Mental Orientation: alert an oriented x 4 Telemetry: initiated on Box 19 Assessment: To be completed Skin: MASDS to sacrum IV: NSL Pain: denies Tubes: None Safety Measures: Safety Fall Prevention Plan has been discussed  Admission: Tot be completed 5 [place] Orientation: Patient has been orientated to the room, unit and staff.   Family: None at bedside  Orders to be reviewed and implemented. Will continue to monitor the patient. Call light has been placed within reach and bed alarm has been activated.

## 2018-08-20 ENCOUNTER — Encounter (HOSPITAL_COMMUNITY): Payer: Self-pay | Admitting: General Practice

## 2018-08-20 DIAGNOSIS — R310 Gross hematuria: Secondary | ICD-10-CM

## 2018-08-20 DIAGNOSIS — R000 Tachycardia, unspecified: Secondary | ICD-10-CM

## 2018-08-20 DIAGNOSIS — R791 Abnormal coagulation profile: Secondary | ICD-10-CM | POA: Diagnosis present

## 2018-08-20 DIAGNOSIS — S37019A Minor contusion of unspecified kidney, initial encounter: Secondary | ICD-10-CM

## 2018-08-20 LAB — HEMOGLOBIN AND HEMATOCRIT, BLOOD
HCT: 20.8 % — ABNORMAL LOW (ref 39.0–52.0)
Hemoglobin: 6.6 g/dL — CL (ref 13.0–17.0)

## 2018-08-20 LAB — CBC
HCT: 20.9 % — ABNORMAL LOW (ref 39.0–52.0)
Hemoglobin: 6.5 g/dL — CL (ref 13.0–17.0)
MCH: 25.8 pg — ABNORMAL LOW (ref 26.0–34.0)
MCHC: 31.1 g/dL (ref 30.0–36.0)
MCV: 82.9 fL (ref 80.0–100.0)
Platelets: 383 10*3/uL (ref 150–400)
RBC: 2.52 MIL/uL — ABNORMAL LOW (ref 4.22–5.81)
RDW: 16.4 % — ABNORMAL HIGH (ref 11.5–15.5)
WBC: 16.4 10*3/uL — ABNORMAL HIGH (ref 4.0–10.5)
nRBC: 0 % (ref 0.0–0.2)

## 2018-08-20 LAB — PROTIME-INR
INR: 6.2 (ref 0.8–1.2)
Prothrombin Time: 53.8 seconds — ABNORMAL HIGH (ref 11.4–15.2)

## 2018-08-20 LAB — PREPARE RBC (CROSSMATCH)

## 2018-08-20 LAB — BASIC METABOLIC PANEL
Anion gap: 14 (ref 5–15)
BUN: 72 mg/dL — ABNORMAL HIGH (ref 6–20)
CO2: 24 mmol/L (ref 22–32)
Calcium: 8.1 mg/dL — ABNORMAL LOW (ref 8.9–10.3)
Chloride: 100 mmol/L (ref 98–111)
Creatinine, Ser: 11.63 mg/dL — ABNORMAL HIGH (ref 0.61–1.24)
GFR calc Af Amer: 5 mL/min — ABNORMAL LOW (ref >60)
GFR calc non Af Amer: 4 mL/min — ABNORMAL LOW (ref >60)
Glucose, Bld: 106 mg/dL — ABNORMAL HIGH (ref 70–99)
Potassium: 5.1 mmol/L (ref 3.5–5.1)
Sodium: 138 mmol/L (ref 135–145)

## 2018-08-20 MED ORDER — VITAMIN K1 10 MG/ML IJ SOLN
5.0000 mg | Freq: Once | INTRAVENOUS | Status: AC
  Administered 2018-08-20: 5 mg via INTRAVENOUS
  Filled 2018-08-20: qty 0.5

## 2018-08-20 MED ORDER — SODIUM CHLORIDE 0.9% IV SOLUTION
Freq: Once | INTRAVENOUS | Status: AC
  Administered 2018-08-20: 02:00:00 via INTRAVENOUS

## 2018-08-20 MED ORDER — DARBEPOETIN ALFA 100 MCG/0.5ML IJ SOSY
100.0000 ug | PREFILLED_SYRINGE | INTRAMUSCULAR | Status: DC
  Administered 2018-08-21 – 2018-09-04 (×3): 100 ug via INTRAVENOUS
  Filled 2018-08-20 (×2): qty 0.5

## 2018-08-20 MED ORDER — SODIUM CHLORIDE 0.9% IV SOLUTION
Freq: Once | INTRAVENOUS | Status: AC
  Administered 2018-08-20: 10:00:00 via INTRAVENOUS

## 2018-08-20 MED ORDER — DOXERCALCIFEROL 0.5 MCG PO CAPS: 2.0000 ug | ORAL_CAPSULE | ORAL | Status: DC

## 2018-08-20 MED ORDER — FENTANYL CITRATE (PF) 100 MCG/2ML IJ SOLN
25.0000 ug | INTRAMUSCULAR | Status: DC | PRN
  Administered 2018-08-20 – 2018-08-21 (×3): 50 ug via INTRAVENOUS
  Filled 2018-08-20 (×3): qty 2

## 2018-08-20 MED ORDER — SODIUM CHLORIDE 0.9% IV SOLUTION
Freq: Once | INTRAVENOUS | Status: AC
  Administered 2018-08-20: 18:00:00 via INTRAVENOUS

## 2018-08-20 MED ORDER — ACETAMINOPHEN 325 MG PO TABS
650.0000 mg | ORAL_TABLET | Freq: Four times a day (QID) | ORAL | Status: DC
  Administered 2018-08-20 – 2018-09-04 (×46): 650 mg via ORAL
  Filled 2018-08-20 (×53): qty 2

## 2018-08-20 MED ORDER — PHYTONADIONE 5 MG PO TABS: 5.0000 mg | ORAL_TABLET | Freq: Once | ORAL | Status: DC

## 2018-08-20 MED ORDER — SODIUM CHLORIDE 0.9 % IV BOLUS: 250.0000 mL | Freq: Once | INTRAVENOUS | Status: DC

## 2018-08-20 NOTE — Progress Notes (Signed)
Internal Medicine Attending Attestation:   I have seen and evaluated this patient and I have discussed the plan of care with the house staff. Please see their note for complete details. I concur with their findings with the following additions/corrections:   Please see my separate attestation of the H&P from 08/19/2018.  Hgb continues to drop today despite transfusion, will transfuse again today.  INR is still supratherapeutic, will give vitamin K today.  Appreciate urology and IR consultation, both recommending conservative management for now.  We will see how he does after INR improves.  Unfortunately, for long-term management, no alternative to warfarin has been studied in mechanical valve replacement.  We will discuss with his cardiothoracic surgeon.  Oda Kilts, MD 08/20/2018, 11:55 AM

## 2018-08-20 NOTE — Progress Notes (Signed)
Patient ID: [name] [name], male   DOB: 03/09/1966, 53 y.o.   MRN: [id number]   IR aware of this pt Dr Annamaria Boots has reviewed imaging and chart  Dr Arville Go note 5/3: 53 y.o. 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 This hematoma has increased in size since his repeat imaging on 08/04/18, consistent with recurrent bleeding in the setting of a supratherapeutic INR. As he is hemodynamically stable and bleeding will likely subside once INR decreases into therapeutic range, we would favor conservative management for this initially.   CT 5/3:  IMPRESSION: 1. Enlarged polycystic kidneys. Marked asymmetric enlargement of the left kidney with large heterogeneous hyperdense left perinephric hematoma. Left kidney has mildly increased in size since 08/04/2018 CT. Findings may indicate continued left perinephric hemorrhage. 2. Stable distention of the left renal collecting system and left ureter by heterogeneous hyperdense clots. No change in extensive heterogeneous clots layering in the bladder, which is moderately distended, cannot exclude bladder outlet obstruction. 3. Mildly increased patchy ground-glass opacity in the peripheral right middle lobe, nonspecific, cannot exclude atypical infection. 4. Moderate left colonic diverticulosis. 5. Cholelithiasis. 6. Aortic Atherosclerosis (ICD10-I70.0).   Hemodynamically stable INR 5.5  MD requesting consideration for embolization  With stability of pt INR so high; would hold on embolization  If hg were to decline or noted worsening bleeding If imaging were to show increase in hematoma Would recommend: consult to CTS for anticoagulation evaluation Need INR 1.5 or lower  Would need Urology to approve moving forward with  procedure  Please let us know if need IR involvement

## 2018-08-20 NOTE — Progress Notes (Addendum)
Subjective: [name] [name] was transfused 1u RBCs overnight for a Hb of 6.9. Repeat Hb this morning was 6.5, so a second unit of blood was ordered. He was also hypotensive to 85/41 prior to transfusion. His pressures continue to be soft, but improved to the 20U systolic. This morning, [name] [name] reports that his pain is primarily in his left flank and back now, but is controlled with the fentanyl. He has continued to have urethral bleeding overnight. He requests ice chips for dry mouth, but denies appetite.   Objective:  Vital signs in last 24 hours: Vitals:   08/20/18 0119 08/20/18 0145 08/20/18 0245 08/20/18 0405  BP: (!) 85/41 (!) 98/56 100/62 93/63  Pulse: (!) 106 (!) 106 (!) 103 100  Resp: 16 19  18   Temp: 98.8 F (37.1 C) 98.3 F (36.8 C)  97.9 F (36.6 C)  TempSrc: Oral Oral  Oral  SpO2:      Weight:      Height:       Gen: alert and oriented, no distress CV: RRR, mechanical AVR click Pulm: CTAB, normal effort on room air Abd: Soft, non-distended, non-tender Back: Left flank pain with palpation  GU: Dry blood at urethral meatus Ext: No edema  Assessment/Plan:  Active Problems:   Gross hematuria  [name] [name] is a 53 yo man with CAD s/p CABG, CHF, ESRD on HD, polycystic kidney disease, and aortic valve replacement on Coumadin who presented with abdominal pain and bleeding from the penis secondary to an expanding left-sided perinephric hematoma from PCKD and supratherapeutic INR. Outside hospital CT demonstrated persistent and mildly increased hematoma, likely expanding due to supratherapeutic INR.  L perinephric hematoma: Second admission for this hematoma.  Hb decreased from 7.6 on admission to 6.9 overnight, requiring 1u RBC transfusion. Post-transfusion CBC 6.5 with continued scant bleeding from the urethra. Urology saw the patient yesterday and recommended conservative treatment with consideration of angiography/embolization with IR if patient decompensates. He is currently  hemodynamically stable, but given his significant anemia despite transfusion and hypotension, discussed case with IR. IR to see patient today. INR goal of 1.5 before he would be safe for IR intervention. I discussed case with Dr. Roxy Manns, the cardiothoracic surgeon who performed his AVR. He agreed that reversal of the coumadin in the setting of bleeding his appropriate, however he should resume coumadin at a therapeutic level as soon as possible.  - Urology following, appreciate recs - IR consulted, appreciate recs - F/u post-transfusion CBC - F/u INR. Will likely need reversal with Vitamin K if still elevated.  - Hold coumadin - Fentanyl q3hrs prn for pain  - Bed rest - NPO except for ice chips - VTE ppx with SCDs  AVR on Coumadin Supratherapeutic INR - Hold coumadin  - Trend INR  ESRD on HD TTS: Last dialyzed 4/30. Missed 5/2 session. Normokalemic. No signs of uremia or volume overload.  - Nephrology consulted, appreciate recs - Continue home fosrenol - Trend BMP   Hypotension: Two isolated hypotensive readings last night as low as 85/41. BP improved to 54Y systolic after blood transfusion. Did not get midodrine at all yesterday, usually takes every day. Patient also takes metoprolol 25mg  BID at home. This was discontinued during his last hospitalization, but he has continued to take it. - Continue home midodrine 15mg  TID - Hold metoprolol   CAD s/p CABG Systolic CHF Echo on 7/06 showed an EF of 30% and grade 1 diastolic dysfunction - Continue home amiodarone 200mg  daily - Volume  management with HD  Leukocytosis: White count decreased from 18 to 16 today. No obvious signs of infection. May be caused by stress reaction or resorption of known hematoma.  - Trend CBC  Dispo: Anticipated discharge in approximately 4-5 day(s).   Rosmary Dionisio, [name], MD 08/20/2018, 6:34 AM Pager: [phone]

## 2018-08-20 NOTE — Progress Notes (Signed)
CRITICAL VALUE STICKER  CRITICAL VALUE:INR=6.2 PT=53.8  RECEIVER (on-site recipient of call): Emilio Math, RN    DATE & TIME NOTIFIED: 08/20/18 1058   MESSENGER (representative from lab):Kim   MD NOTIFIED: Internal med. Pager   TIME OF NOTIFICATION: 08/20/18 1100  RESPONSE: MD called back and is aware of critical INR and PT, plan is for pt to receive Vit. Venora Maples, RN

## 2018-08-20 NOTE — Progress Notes (Signed)
Instructed phlebotomy to draw morning labs at 6am for CBC to be collected 2 hours after PRBC transfusion.

## 2018-08-20 NOTE — Progress Notes (Signed)
Urology Progress Note      Subjective: Patient reports ongoing pain this morning, but currently controlled. Denies recent blood per urethra.   Objective: Vital signs in last 24 hours: Temp:  [97.9 F (36.6 C)-99.6 F (37.6 C)] 98.5 F (36.9 C) (05/04 1006) Pulse Rate:  [100-124] 101 (05/04 1006) Resp:  [15-26] 18 (05/04 1006) BP: (85-122)/(34-79) 97/58 (05/04 1006) SpO2:  [94 %-100 %] 97 % (05/04 1006) Weight:  [129.1 kg-132 kg] 129.1 kg (05/03 2112)  Intake/Output from previous day: 05/03 0701 - 05/04 0700 In: 445 [I.V.:130; Blood:315] Out: 0  Intake/Output this shift: No intake/output data recorded.  Physical Exam:  General: Alert and oriented CV: tachycardic, but improved from yesterday Lungs: NWOB Abdomen: Soft, tender Ext: NT, No erythema  Lab Results: Recent Labs    08/19/18 1513 08/19/18 2251 08/20/18 0632  HGB 7.6* 6.9* 6.5*  HCT 25.5* 22.5* 20.9*   BMET Recent Labs    08/19/18 1513 08/20/18 0632  NA 139 138  K 4.4 5.1  CL 102 100  CO2 21* 24  GLUCOSE 101* 106*  BUN 61* 72*  CREATININE 10.60* 11.63*  CALCIUM 8.3* 8.1*     Studies/Results: Dg Chest Port 1 View  Result Date: 08/19/2018 CLINICAL DATA:  Abdominal pain, bleeding of the penis EXAM: PORTABLE CHEST 1 VIEW COMPARISON:  08/01/2018 FINDINGS: There is no focal parenchymal opacity. There is no pleural effusion or pneumothorax. The heart and mediastinal contours are unremarkable. There is prior median sternotomy. The osseous structures are unremarkable. IMPRESSION: No active disease. Electronically Signed   By: Kathreen Devoid   On: 08/19/2018 18:49    Assessment/Plan:  53 y.o. 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 This hematoma has increased in size since his repeat imaging on 08/04/18, consistent with recurrent bleeding in the setting of a  supratherapeutic INR. As he is hemodynamically stable and bleeding will likely subside once INR decreases into therapeutic range, we would favor conservative management for this initially. He is HDS, but is requiring blood transfusions.   - Agree with hospital admission to a medical service for monitoring and serial lab work. - Recommend bedrest serial hemoglobins, vital signs, and transfusion as needed until Hgb stabilizes.  - If okay with cardiology in the setting of a mechanical valve, hold coumadin (and starting IV heparin as needed) given supratherapeutic INR until hemoglobin stabilizes.  - Consider changing anticoagulant regimen given his inability to regulate his coumadin. - If patient deteriorates acutely or if he is refractory to the above management, would consider embolization but VIR.  - No strong indication for urethral catheter at this time as patient denies significant urinary complaints. Discussed possible placement if patient develops urinary complaints. He does have intravesical clot although it is okay for bladder to clot and reabsorb, as he is completely anuric at baseline - Patient will need to follow-up with urology as an outpatient for further imaging to ensure no underlying malignant renal mass as source of bleed.      LOS: 1 day   Dorothey Baseman 08/20/2018, 11:41 AM

## 2018-08-20 NOTE — Consult Note (Addendum)
[facility] Renal Consultation Note  Indication for Consultation:  Management of ESRD/hemodialysis; anemia, hypertension/volume and secondary hyperparathyroidism  HPI: [name] [name] is a 53 y.o. male with past medical history significant for ESRD( polycystic kidney disease)  On HD [facility] TTS (followed by Dr Hinda Lenis), hypertension, coronary artery disease status post CABG, congestive heart failure, history of aortic valve replacement on chronic anticoagulation with warfarin . Recent admit 4/15-21/20 Perinephric Hematoma( ikely ruptured hemorrhagic Cyst and Supratympanic INR)Anemia2/2 acute bld loss/HO mechanical AVR on Coumadin = multiple transfusions,Inr stable and dc  On coumadin.    Now readmitted same problem abd pain and  hematuria   Hgb 6.5 INR 6.2 CT abd with "increased perinephric hematoma,"  Abd pain past Saturday causing him to miss hd . Denies sob, cough ,fevers, chills, N/v/d, CP . No difficulty using AVF at op unit. Seen in room only co some Left flank  Discomfort ,not eating or drinking  much past several days. We are consulted for HD/ESRD issues  cxr nad      Past Medical History:  Diagnosis Date  . Aneurysm ([place])     Right arm fistula 3 aneurysms 2011,   plans to have a new procedure  . Angina   . Chest discomfort   . CHF (congestive heart failure) ([place])   . Coronary artery disease   . Coronary artery disease involving native coronary artery of native heart without angina pectoris   . Dialysis patient ([place])   . Dysrhythmia   . ESRD (end stage renal disease) ([place])    on hemodialysis T_T_S  . Hypertension   . Leg pain   . Mitral regurgitation   . Morbid obesity ([place])   . Orthostatic hypotension   . Overweight(278.02)   . Renal insufficiency   . 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 Severe aortic insufficiency    . Sinus tachycardia   . Syncope     positional after dialysis... 2008    Past Surgical History:  Procedure Laterality Date  . A/V FISTULAGRAM Left 06/30/2016   Procedure: A/V Fistulagram;  Surgeon: Algernon Huxley, MD;  Location: [place] [facility];  Service: Cardiovascular;  Laterality: Left;  . AORTIC VALVE REPLACEMENT N/A 09/01/2017   Procedure: AORTIC VALVE REPLACEMENT (AVR);  Surgeon: [name], MD;  Location: [place];  Service: Open Heart Surgery;  Laterality: N/A;  . ARTERIOVENOUS GRAFT PLACEMENT    . AV FISTULA PLACEMENT    . AV FISTULA PLACEMENT Left 11/18/2015   Procedure: ARTERIOVENOUS (AV) FISTULA CREATION ( RADIOCEPHALIC );  Surgeon: Algernon Huxley, MD;  Location: [facility];  Service: Vascular;  Laterality: Left;  . AV FISTULA PLACEMENT Left 03/23/2016   Procedure: ARTERIOVENOUS (AV) FISTULA CREATION ( REVISION );  Surgeon: Algernon Huxley, MD;  Location: [facility];  Service: Vascular;  Laterality: Left;  . [place] REMOVAL Left 03/23/2016   Procedure: REMOVAL OF ARTERIOVENOUS GORETEX GRAFT ([place]);  Surgeon: Algernon Huxley, MD;  Location: [facility];  Service: Vascular;  Laterality: Left;  . CENTRAL LINE INSERTION Right 09/01/2017   Procedure: FLOROSCOPY GUIDED PLACEMENT OF RIGHT FEMORAL CENTRAL LINE TIMES TWO  AND PLACEMENT OF SWAN GANZ CATHETER;  Surgeon: [name], MD;  Location: [place];  Service: Open Heart Surgery;  Laterality: Right;  . CORONARY ARTERY BYPASS GRAFT N/A 09/01/2017  Procedure: CORONARY ARTERY BYPASS GRAFTING (CABG) X 1, USING RIGHT GREATER SAPHENOUS VEIN HARVESTED ENDOSCOPICALLY;  Surgeon: [name], MD;  Location: [place];  Service: Open Heart Surgery;  Laterality: N/A;  . DIALYSIS/PERMA CATHETER REMOVAL N/A 08/01/2016   Procedure: Dialysis/Perma Catheter Removal;  Surgeon: Algernon Huxley, MD;  Location: [place] [facility];  Service: Cardiovascular;  Laterality: N/A;  . HERNIA REPAIR    . INSERTION OF DIALYSIS CATHETER  03/01/2011   Procedure: INSERTION OF DIALYSIS  CATHETER;  Surgeon: Elam Dutch, MD;  Location: [place];  Service: Vascular;  Laterality: Left;  Exchange of Dialysis Catheter to 27cm 15Fr. Arrow Catheter  . INSERTION OF DIALYSIS CATHETER  09/01/2017   Procedure: PLACEMENT OF TRIALYSIS SHORT TERM DIALYSIS CATHETER;  Surgeon: [name], MD;  Location: [place] [facility];  Service: Open Heart Surgery;;  . MULTIPLE EXTRACTIONS WITH ALVEOLOPLASTY N/A 08/07/2017   Procedure: Extraction of tooth #2, 7,8,13,14,15,17, and 31 with alveoloplasty and gross debridement of remaining teeth;  Surgeon: [name], DDS;  Location: [place];  Service: Oral Surgery;  Laterality: N/A;  . PERIPHERAL VASCULAR CATHETERIZATION N/A 09/01/2014   Procedure: A/V Shuntogram/Fistulagram;  Surgeon: Algernon Huxley, MD;  Location: [place] [facility];  Service: Cardiovascular;  Laterality: N/A;  . PERIPHERAL VASCULAR CATHETERIZATION Right 09/01/2014   Procedure: Thrombectomy;  Surgeon: Algernon Huxley, MD;  Location: [place] [facility];  Service: Cardiovascular;  Laterality: Right;  . PERIPHERAL VASCULAR CATHETERIZATION Right 09/01/2014   Procedure: A/V Shunt Intervention;  Surgeon: Algernon Huxley, MD;  Location: [place] [facility];  Service: Cardiovascular;  Laterality: Right;  . PERIPHERAL VASCULAR CATHETERIZATION N/A 09/17/2014   Procedure: A/V Shuntogram/Fistulagram;  Surgeon: Algernon Huxley, MD;  Location: [place] [facility];  Service: Cardiovascular;  Laterality: N/A;  . PERIPHERAL VASCULAR CATHETERIZATION Right 10/17/2014   Procedure: Thrombectomy;  Surgeon: Katha Cabal, MD;  Location: [place] [facility];  Service: Cardiovascular;  Laterality: Right;  . PERIPHERAL VASCULAR CATHETERIZATION N/A 10/17/2014   Procedure: A/V Shuntogram/Fistulagram;  Surgeon: Katha Cabal, MD;  Location: [place] [facility];  Service: Cardiovascular;  Laterality: N/A;  . PERIPHERAL VASCULAR CATHETERIZATION N/A 10/17/2014   Procedure: A/V Shunt Intervention;  Surgeon: Katha Cabal, MD;   Location: [place] [facility];  Service: Cardiovascular;  Laterality: N/A;  . PERIPHERAL VASCULAR CATHETERIZATION Right 11/04/2014   Procedure: Thrombectomy;  Surgeon: Katha Cabal, MD;  Location: [place] [facility];  Service: Cardiovascular;  Laterality: Right;  . PERIPHERAL VASCULAR CATHETERIZATION Left 11/04/2014   Procedure: Visceral Venography;  Surgeon: Katha Cabal, MD;  Location: [place] [facility];  Service: Cardiovascular;  Laterality: Left;  . PERIPHERAL VASCULAR CATHETERIZATION Right 11/27/2014   Procedure: A/V Shuntogram/Fistulagram;  Surgeon: Algernon Huxley, MD;  Location: [place] [facility];  Service: Cardiovascular;  Laterality: Right;  . PERIPHERAL VASCULAR CATHETERIZATION Right 11/27/2014   Procedure: A/V Shunt Intervention;  Surgeon: Algernon Huxley, MD;  Location: [place] [facility];  Service: Cardiovascular;  Laterality: Right;  . PERIPHERAL VASCULAR CATHETERIZATION Right 12/31/2014   Procedure: Thrombectomy;  Surgeon: Algernon Huxley, MD;  Location: [place] [facility];  Service: Cardiovascular;  Laterality: Right;  . PERIPHERAL VASCULAR CATHETERIZATION Right 01/12/2015   Procedure: A/V Shuntogram/Fistulagram;  Surgeon: Algernon Huxley, MD;  Location: [place] [facility];  Service: Cardiovascular;  Laterality: Right;  . PERIPHERAL VASCULAR CATHETERIZATION N/A 01/12/2015   Procedure: A/V Shunt Intervention;  Surgeon: Algernon Huxley, MD;  Location: [facility]  INVASIVE [facility];  Service: Cardiovascular;  Laterality: N/A;  . PERIPHERAL VASCULAR CATHETERIZATION Right 01/28/2015   Procedure: Thrombectomy;  Surgeon: Algernon Huxley, MD;  Location: [place] [facility];  Service: Cardiovascular;  Laterality: Right;  . PERIPHERAL VASCULAR CATHETERIZATION N/A 06/08/2015   Procedure: A/V Shuntogram/Fistulagram;  Surgeon: Algernon Huxley, MD;  Location: [place] [facility];  Service: Cardiovascular;  Laterality: N/A;  . PERIPHERAL VASCULAR CATHETERIZATION N/A 06/08/2015   Procedure: A/V Shunt Intervention;   Surgeon: Algernon Huxley, MD;  Location: [place] [facility];  Service: Cardiovascular;  Laterality: N/A;  . PERIPHERAL VASCULAR CATHETERIZATION Right 07/06/2015   Procedure: A/V Shuntogram/Fistulagram;  Surgeon: Algernon Huxley, MD;  Location: [place] [facility];  Service: Cardiovascular;  Laterality: Right;  . PERIPHERAL VASCULAR CATHETERIZATION N/A 07/06/2015   Procedure: A/V Shunt Intervention;  Surgeon: Algernon Huxley, MD;  Location: [place] [facility];  Service: Cardiovascular;  Laterality: N/A;  . PERIPHERAL VASCULAR CATHETERIZATION N/A 08/04/2015   Procedure: graft declot;  Surgeon: Katha Cabal, MD;  Location: [place] [facility];  Service: Cardiovascular;  Laterality: N/A;  . PERIPHERAL VASCULAR CATHETERIZATION Right 08/25/2015   Procedure: A/V Shuntogram/Fistulagram;  Surgeon: Katha Cabal, MD;  Location: [place] [facility];  Service: Cardiovascular;  Laterality: Right;  . PERIPHERAL VASCULAR CATHETERIZATION N/A 11/04/2015   Procedure: Dialysis/Perma Catheter Insertion;  Surgeon: Algernon Huxley, MD;  Location: [place] [facility];  Service: Cardiovascular;  Laterality: N/A;  . PERIPHERAL VASCULAR CATHETERIZATION Left 12/14/2015   Procedure: A/V Shuntogram/Fistulagram;  Surgeon: Algernon Huxley, MD;  Location: [place] [facility];  Service: Cardiovascular;  Laterality: Left;  . PERIPHERAL VASCULAR CATHETERIZATION N/A 12/14/2015   Procedure: A/V Shunt Intervention;  Surgeon: Algernon Huxley, MD;  Location: [place] [facility];  Service: Cardiovascular;  Laterality: N/A;  . PERIPHERAL VASCULAR CATHETERIZATION N/A 12/17/2015   Procedure: Dialysis/Perma Catheter Insertion;  Surgeon: Algernon Huxley, MD;  Location: [place] [facility];  Service: Cardiovascular;  Laterality: N/A;  . PERIPHERAL VASCULAR CATHETERIZATION Left 12/22/2015   Procedure: Dialysis/Perma Catheter Insertion;  Surgeon: Katha Cabal, MD;  Location: [place] [facility];  Service: Cardiovascular;  Laterality: Left;  . PERIPHERAL  VASCULAR CATHETERIZATION Left 02/29/2016   Procedure: A/V Shuntogram/Fistulagram;  Surgeon: Algernon Huxley, MD;  Location: [place] [facility];  Service: Cardiovascular;  Laterality: Left;  . PERIPHERAL VASCULAR CATHETERIZATION N/A 02/29/2016   Procedure: A/V Shunt Intervention;  Surgeon: Algernon Huxley, MD;  Location: [place] [facility];  Service: Cardiovascular;  Laterality: N/A;  . POCKET REVISION/RELOCATION N/A 01/01/2018   Procedure: POCKET REVISION/RELOCATION;  Surgeon: Evans Lance, MD;  Location: [place] [facility];  Service: Cardiovascular;  Laterality: N/A;  . right arm graft     for dyalisis  . RIGHT/LEFT HEART CATH AND CORONARY ANGIOGRAPHY N/A 08/02/2017   Procedure: RIGHT/LEFT HEART CATH AND CORONARY ANGIOGRAPHY;  Surgeon: Larey Dresser, MD;  Location: [place] [facility];  Service: Cardiovascular;  Laterality: N/A;  . SUBQ ICD IMPLANT N/A 11/28/2017   Procedure: SUBQ ICD IMPLANT;  Surgeon: Evans Lance, MD;  Location: [place] [facility];  Service: Cardiovascular;  Laterality: N/A;  . SUBQ ICD REVISION N/A 02/12/2018   Procedure: SUBQ ICD REVISION(REMOVAL);  Surgeon: Evans Lance, MD;  Location: [place] [facility];  Service: Cardiovascular;  Laterality: N/A;  . TEE WITHOUT CARDIOVERSION N/A 07/31/2017   Procedure: TRANSESOPHAGEAL ECHOCARDIOGRAM (TEE);  Surgeon: Josue Hector, MD;  Location: [facility] ENDOSCOPY;  Service: Cardiovascular;  Laterality: N/A;  . TEE WITHOUT CARDIOVERSION N/A 09/01/2017   Procedure: TRANSESOPHAGEAL ECHOCARDIOGRAM (TEE);  Surgeon: [name], MD;  Location: [place];  Service: Open Heart Surgery;  Laterality: N/A;  . THROMBECTOMY    . THROMBECTOMY W/ EMBOLECTOMY  03/01/2011   Procedure: THROMBECTOMY ARTERIOVENOUS GORE-TEX GRAFT;  Surgeon: Elam Dutch, MD;  Location: [place];  Service: Vascular;  Laterality: Right;  Attempted Thrombectomy of Old  Right Upper Arm Arteriovenous gortex Graft. Insertion of new Arteriovenous Graft using 47mm x 50cm Gortex  Stretch graft.   . THROMBECTOMY W/ EMBOLECTOMY  07/11/2011   Procedure: THROMBECTOMY ARTERIOVENOUS GORE-TEX GRAFT;  Surgeon: Rosetta Posner, MD;  Location: [place];  Service: Vascular;  Laterality: Right;  . UMBILICAL HERNIA REPAIR    . VENOGRAM N/A 08/23/2011   Procedure: VENOGRAM;  Surgeon: [name], MD;  Location: [facility];  Service: Cardiovascular;  Laterality: N/A;      Family History  Problem Relation Age of Onset  . Hypertension Mother   . Heart disease Mother   . Hypertension Father   . Heart disease Father       reports that he quit smoking about 24 years ago. His smoking use included cigarettes. His smokeless tobacco use includes snuff. He reports that he does not drink alcohol or use drugs.  No Known Allergies  Prior to Admission medications   Medication Sig Start Date End Date Taking? Authorizing Provider  amiodarone (PACERONE) 200 MG tablet Take 1 tablet (200 mg total) by mouth daily. Patient taking differently: Take 200 mg by mouth at bedtime.  01/16/18  Yes Evans Lance, MD  atorvastatin (LIPITOR) 80 MG tablet Take 1 tablet (80 mg total) by mouth daily at 6 PM. Patient taking differently: Take 80 mg by mouth daily.  10/31/17  Yes Larey Dresser, MD  Cinacalcet HCl (SENSIPAR PO) Take 1 tablet by mouth daily.   Yes [provider]  lanthanum (FOSRENOL) 500 MG chewable tablet Chew 500-1,000 mg by mouth See admin instructions. Take 2 tablets (1000 mg) by mouth three times daily with meals, take 1 tablet (500 mg) with snacks   Yes [provider]  midodrine (PROAMATINE) 5 MG tablet Take 3 tablets (15 mg total) by mouth 3 (three) times daily with meals. Patient taking differently: Take 5 mg by mouth daily.  01/09/18  Yes Evans Lance, MD  warfarin (COUMADIN) 5 MG tablet Take 1 tablet daily except 1/2 tablet on Wednesdays and Sundays or as directed by coumadin clinic Patient taking differently: Take 2.5-5 mg by mouth See admin instructions. Take 1/2  tablet (2.5 mg) by mouth on Sunday and Tuesday nights, take 1 tablet (5 mg) on Monday, Wednesday, Thursday, Friday, Saturday nights or as directed by coumadin clinic 08/07/18  Yes Asencion Noble, MD     Results for orders placed or performed during the hospital encounter of 08/19/18 (from the past 48 hour(s))  Comprehensive metabolic panel     Status: Abnormal   Collection Time: 08/19/18  3:13 PM  Result Value Ref Range   Sodium 139 135 - 145 mmol/L   Potassium 4.4 3.5 - 5.1 mmol/L   Chloride 102 98 - 111 mmol/L   CO2 21 (L) 22 - 32 mmol/L   Glucose, Bld 101 (H) 70 - 99 mg/dL   BUN 61 (H) 6 - 20 mg/dL   Creatinine, Ser 10.60 (H) 0.61 - 1.24 mg/dL   Calcium 8.3 (L) 8.9 - 10.3 mg/dL  Total Protein 7.4 6.5 - 8.1 g/dL   Albumin 2.1 (L) 3.5 - 5.0 g/dL   AST 49 (H) 15 - 41 U/L   ALT 23 0 - 44 U/L   Alkaline Phosphatase 196 (H) 38 - 126 U/L   Total Bilirubin 1.3 (H) 0.3 - 1.2 mg/dL   GFR calc non Af Amer 5 (L) >60 mL/min   GFR calc Af Amer 6 (L) >60 mL/min   Anion gap 16 (H) 5 - 15    Comment: Performed at [facility] [address], [place], [place] [address]  CBC with Differential     Status: Abnormal   Collection Time: 08/19/18  3:13 PM  Result Value Ref Range   WBC 19.1 (H) 4.0 - 10.5 K/uL   RBC 2.96 (L) 4.22 - 5.81 MIL/uL   Hemoglobin 7.6 (L) 13.0 - 17.0 g/dL   HCT 25.5 (L) 39.0 - 52.0 %   MCV 86.1 80.0 - 100.0 fL   MCH 25.7 (L) 26.0 - 34.0 pg   MCHC 29.8 (L) 30.0 - 36.0 g/dL   RDW 16.7 (H) 11.5 - 15.5 %   Platelets 636 (H) 150 - 400 K/uL   nRBC 0.0 0.0 - 0.2 %   Neutrophils Relative % 90 %   Neutro Abs 17.1 (H) 1.7 - 7.7 K/uL   Lymphocytes Relative 3 %   Lymphs Abs 0.6 (L) 0.7 - 4.0 K/uL   Monocytes Relative 6 %   Monocytes Absolute 1.2 (H) 0.1 - 1.0 K/uL   Eosinophils Relative 0 %   Eosinophils Absolute 0.1 0.0 - 0.5 K/uL   Basophils Relative 0 %   Basophils Absolute 0.1 0.0 - 0.1 K/uL   Immature Granulocytes 1 %   Abs Immature Granulocytes 0.11 (H) 0.00  - 0.07 K/uL    Comment: Performed at [facility] [address] [place], [place] [address]  Protime-INR     Status: Abnormal   Collection Time: 08/19/18  3:13 PM  Result Value Ref Range   Prothrombin Time 49.1 (H) 11.4 - 15.2 seconds   INR 5.5 (HH) 0.8 - 1.2    Comment: REPEATED TO VERIFY CRITICAL RESULT CALLED TO, READ BACK BY AND VERIFIED WITH: MAGGIE BARBER [email or web address]  ON 5.3.2020 BY TCALDWELL MT. (NOTE) INR goal varies based on device and disease states. Performed at [facility], [place] [address] [place], [place] [address]   APTT     Status: Abnormal   Collection Time: 08/19/18  3:13 PM  Result Value Ref Range   aPTT 73 (H) 24 - 36 seconds    Comment:        IF BASELINE aPTT IS ELEVATED, SUGGEST PATIENT RISK ASSESSMENT BE USED TO DETERMINE APPROPRIATE ANTICOAGULANT THERAPY. Performed at [facility], [place] [address], [place], [place] [address]   Type and screen     Status: None (Preliminary result)   Collection Time: 08/19/18  3:13 PM  Result Value Ref Range   ABO/RH(D) B POS    Antibody Screen NEG    Sample Expiration 08/22/2018    Unit Number [id number]    Blood Component Type RED CELLS,LR    Unit division 00    Status of Unit ISSUED    Transfusion Status OK TO TRANSFUSE    Crossmatch Result Compatible    Unit Number [id number]    Blood Component Type RED CELLS,LR    Unit division 00    Status of Unit ISSUED    Transfusion Status OK  TO TRANSFUSE    Crossmatch Result      Compatible Performed at [facility], [place] [address] [place],  [address]   SARS Coronavirus 2 (CEPHEID - Performed in [place] hospital lab), Hosp Order     Status: None   Collection Time: 08/19/18  5:11 PM  Result Value Ref Range   SARS Coronavirus 2 NEGATIVE NEGATIVE    Comment: (NOTE) If result is NEGATIVE SARS-CoV-2 target nucleic acids are NOT DETECTED. The SARS-CoV-2 RNA is generally detectable in upper and lower  respiratory specimens during  the acute phase of infection. The lowest  concentration of SARS-CoV-2 viral copies this assay can detect is 250  copies / mL. A negative result does not preclude SARS-CoV-2 infection  and should not be used as the sole basis for treatment or other  patient management decisions.  A negative result may occur with  improper specimen collection / handling, submission of specimen other  than nasopharyngeal swab, presence of viral mutation(s) within the  areas targeted by this assay, and inadequate number of viral copies  (<250 copies / mL). A negative result must be combined with clinical  observations, patient history, and epidemiological information. If result is POSITIVE SARS-CoV-2 target nucleic acids are DETECTED. The SARS-CoV-2 RNA is generally detectable in upper and lower  respiratory specimens dur ing the acute phase of infection.  Positive  results are indicative of active infection with SARS-CoV-2.  Clinical  correlation with patient history and other diagnostic information is  necessary to determine patient infection status.  Positive results do  not rule out bacterial infection or co-infection with other viruses. If result is PRESUMPTIVE POSTIVE SARS-CoV-2 nucleic acids MAY BE PRESENT.   A presumptive positive result was obtained on the submitted specimen  and confirmed on repeat testing.  While 2019 novel coronavirus  (SARS-CoV-2) nucleic acids may be present in the submitted sample  additional confirmatory testing may be necessary for epidemiological  and / or clinical management purposes  to differentiate between  SARS-CoV-2 and other Sarbecovirus currently known to infect humans.  If clinically indicated additional testing with an alternate test  methodology [phone]) is advised. The SARS-CoV-2 RNA is generally  detectable in upper and lower respiratory sp ecimens during the acute  phase of infection. The expected result is Negative. Fact Sheet for Patients:   StrictlyIdeas.no Fact Sheet for Healthcare Providers: BankingDealers.co.za This test is not yet approved or cleared by the [place] FDA and has been authorized for detection and/or diagnosis of SARS-CoV-2 by FDA under an Emergency Use Authorization (EUA).  This EUA will remain in effect (meaning this test can be used) for the duration of the COVID-19 declaration under Section 564(b)(1) of the Act, 21 U.S.C. section 360bbb-3(b)(1), unless the authorization is terminated or revoked sooner. Performed at [facility], [place] [address], [place] [address]   CBC     Status: Abnormal   Collection Time: 08/19/18 10:51 PM  Result Value Ref Range   WBC 17.9 (H) 4.0 - 10.5 K/uL   RBC 2.74 (L) 4.22 - 5.81 MIL/uL   Hemoglobin 6.9 (LL) 13.0 - 17.0 g/dL    Comment: REPEATED TO VERIFY THIS CRITICAL RESULT HAS VERIFIED AND BEEN CALLED TO RN TINA ISAACS BY [facility] ON 05 03 2020 AT 2323, AND HAS BEEN READ BACK.     HCT 22.5 (L) 39.0 - 52.0 %   MCV 82.1 80.0 - 100.0 fL   MCH 25.2 (L) 26.0 - 34.0  pg   MCHC 30.7 30.0 - 36.0 g/dL   RDW 16.6 (H) 11.5 - 15.5 %   Platelets 411 (H) 150 - 400 K/uL   nRBC 0.0 0.0 - 0.2 %    Comment: Performed at [facility], [place] [address], [place], [place] [address]  Prepare RBC     Status: None   Collection Time: 08/20/18 12:23 AM  Result Value Ref Range   Order Confirmation      ORDER PROCESSED BY BLOOD BANK Performed at [facility], [place] [address] [place], [place] [address]   Protime-INR     Status: Abnormal   Collection Time: 08/20/18  6:32 AM  Result Value Ref Range   Prothrombin Time 53.8 (H) 11.4 - 15.2 seconds    Comment: REPEATED TO VERIFY   INR 6.2 (HH) 0.8 - 1.2    Comment: REPEATED TO VERIFY CRITICAL RESULT CALLED TO, READ BACK BY AND VERIFIED WITH: Madolyn Frieze [phone] [id number] [address] (NOTE) INR goal varies based on device and disease states. Performed at [facility], [place] [address] [place] [address]   CBC     Status: Abnormal   Collection Time: 08/20/18  6:32 AM  Result Value Ref Range   WBC 16.4 (H) 4.0 - 10.5 K/uL   RBC 2.52 (L) 4.22 - 5.81 MIL/uL   Hemoglobin 6.5 (LL) 13.0 - 17.0 g/dL    Comment: REPEATED TO VERIFY THIS CRITICAL RESULT HAS VERIFIED AND BEEN CALLED TO TINA ISAACS RN BY SHANNON FLEMING ON 05 04 2020 AT 8144, AND HAS BEEN READ BACK.     HCT 20.9 (L) 39.0 - 52.0 %   MCV 82.9 80.0 - 100.0 fL   MCH 25.8 (L) 26.0 - 34.0 pg   MCHC 31.1 30.0 - 36.0 g/dL   RDW 16.4 (H) 11.5 - 15.5 %   Platelets 383 150 - 400 K/uL   nRBC 0.0 0.0 - 0.2 %    Comment: Performed at [facility] [address], [place], [place] [address]  Basic metabolic panel     Status: Abnormal   Collection Time: 08/20/18  6:32 AM  Result Value Ref Range   Sodium 138 135 - 145 mmol/L   Potassium 5.1 3.5 - 5.1 mmol/L   Chloride 100 98 - 111 mmol/L   CO2 24 22 - 32 mmol/L   Glucose, Bld 106 (H) 70 - 99 mg/dL   BUN 72 (H) 6 - 20 mg/dL   Creatinine, Ser 11.63 (H) 0.61 - 1.24 mg/dL   Calcium 8.1 (L) 8.9 - 10.3 mg/dL   GFR calc non Af Amer 4 (L) >60 mL/min   GFR calc Af Amer 5 (L) >60 mL/min   Anion gap 14 5 - 15    Comment: Performed at [facility] [address] [place], [place] [address]  Prepare RBC     Status: None   Collection Time: 08/20/18  7:33 AM  Result Value Ref Range   Order Confirmation      ORDER PROCESSED BY BLOOD BANK Performed at [facility], [place] [address] [place], [place] [address]     ROS: seehpi   Physical Exam: Vitals:   08/20/18 1006 08/20/18 1213  BP: (!) 97/58 107/71  Pulse: (!) 101 100  Resp: 18   Temp: 98.5 F (36.9 C)   SpO2: 97% 100%     General: alert obese male nad  HEENT: [place] , not icteric, MMM Neck:  no jvd, supple  Heart: RRR prostheic valve click Lungs: CTA non labored breathing  Abdomen: obese, BS +, Left flank pain Extremities: Trace pedal edema Skin: No overt  rash Neuro: OX3, moves all extrem Dialysis Access: Pos bruit LFA AVF  Hemodialysis prescription: TTS, [facility], 4 hours 45 minutes, left forearm AVF (15G), BFR 450, DFR 800, 2K/2.5 calcium. Heparin NONE . EDW 133.5 kg. Hectorol 2.0 mcg TIW, Parsabiv 2.5mg  TIW/ epo 6600  Assessment/Plan 1. Recurrent L perinephric hematoma with ^ INR( ho polycystic kidney disease)- Urology, admit team wu  HGB 6.5   2. ESRD -  HD ON TTS /  K5.1  last HD 4/30  No excess vol on exam today and k ok due to emergent cases in HD  Next HD tomor 5/06 / no heparin  3. Hypertension/volume  - bp controlled and again appears to be on the softer side as with last admit / on midodrine 15 mg tid  4. AVR On Coumadin  With  Supratherapeutic INR 5.5 on admit- Plans per admit  Holding coumadin  5. CAD/ CABG/ S CHF  Ho = cxr  nad  6. Anemia  Of esrd and acute bld losses as above /transfue hgb <7 ,esa on hd  7. Metabolic bone disease -  will follow calcium/phosphorus level vit on hd    Ernest Haber, PA-C [facility] Beeper [phone] 08/20/2018, 12:31 PM    Chart reviewed. Seen and examined. I agree with above.  Monitor electrolytes and Hb  Plan for dialysis tomorrow.   Lawson Radar, MD

## 2018-08-21 ENCOUNTER — Telehealth: Payer: Self-pay | Admitting: Cardiovascular Disease

## 2018-08-21 LAB — CBC
HCT: 23.2 % — ABNORMAL LOW (ref 39.0–52.0)
HCT: 22.7 % — ABNORMAL LOW (ref 39.0–52.0)
HCT: 21.2 % — ABNORMAL LOW (ref 39.0–52.0)
Hemoglobin: 7.4 g/dL — ABNORMAL LOW (ref 13.0–17.0)
Hemoglobin: 7.3 g/dL — ABNORMAL LOW (ref 13.0–17.0)
Hemoglobin: 6.8 g/dL — CL (ref 13.0–17.0)
MCH: 26 pg (ref 26.0–34.0)
MCH: 26.4 pg (ref 26.0–34.0)
MCH: 26.2 pg (ref 26.0–34.0)
MCHC: 31.9 g/dL (ref 30.0–36.0)
MCHC: 32.2 g/dL (ref 30.0–36.0)
MCHC: 32.1 g/dL (ref 30.0–36.0)
MCV: 81.4 fL (ref 80.0–100.0)
MCV: 82.2 fL (ref 80.0–100.0)
MCV: 81.5 fL (ref 80.0–100.0)
Platelets: 399 10*3/uL (ref 150–400)
Platelets: 391 10*3/uL (ref 150–400)
Platelets: 377 10*3/uL (ref 150–400)
RBC: 2.85 MIL/uL — ABNORMAL LOW (ref 4.22–5.81)
RBC: 2.76 MIL/uL — ABNORMAL LOW (ref 4.22–5.81)
RBC: 2.6 MIL/uL — ABNORMAL LOW (ref 4.22–5.81)
RDW: 16 % — ABNORMAL HIGH (ref 11.5–15.5)
RDW: 16.2 % — ABNORMAL HIGH (ref 11.5–15.5)
RDW: 16 % — ABNORMAL HIGH (ref 11.5–15.5)
WBC: 20.8 10*3/uL — ABNORMAL HIGH (ref 4.0–10.5)
WBC: 21.4 10*3/uL — ABNORMAL HIGH (ref 4.0–10.5)
WBC: 17.8 10*3/uL — ABNORMAL HIGH (ref 4.0–10.5)
nRBC: 0 % (ref 0.0–0.2)
nRBC: 0 % (ref 0.0–0.2)
nRBC: 0 % (ref 0.0–0.2)

## 2018-08-21 LAB — PROTIME-INR
INR: 1.7 — ABNORMAL HIGH (ref 0.8–1.2)
Prothrombin Time: 20 seconds — ABNORMAL HIGH (ref 11.4–15.2)

## 2018-08-21 LAB — PREPARE RBC (CROSSMATCH)

## 2018-08-21 LAB — RENAL FUNCTION PANEL
Albumin: 1.9 g/dL — ABNORMAL LOW (ref 3.5–5.0)
Anion gap: 17 — ABNORMAL HIGH (ref 5–15)
BUN: 88 mg/dL — ABNORMAL HIGH (ref 6–20)
CO2: 22 mmol/L (ref 22–32)
Calcium: 8.7 mg/dL — ABNORMAL LOW (ref 8.9–10.3)
Chloride: 97 mmol/L — ABNORMAL LOW (ref 98–111)
Creatinine, Ser: 12.82 mg/dL — ABNORMAL HIGH (ref 0.61–1.24)
GFR calc Af Amer: 5 mL/min — ABNORMAL LOW (ref >60)
GFR calc non Af Amer: 4 mL/min — ABNORMAL LOW (ref >60)
Glucose, Bld: 92 mg/dL (ref 70–99)
Phosphorus: 7.7 mg/dL — ABNORMAL HIGH (ref 2.5–4.6)
Potassium: 5.5 mmol/L — ABNORMAL HIGH (ref 3.5–5.1)
Sodium: 136 mmol/L (ref 135–145)

## 2018-08-21 LAB — HEPARIN LEVEL (UNFRACTIONATED): Heparin Unfractionated: 0.15 IU/mL — ABNORMAL LOW (ref 0.30–0.70)

## 2018-08-21 MED ORDER — PRO-STAT SUGAR FREE PO LIQD
30.0000 mL | Freq: Two times a day (BID) | ORAL | Status: DC
  Administered 2018-08-21 – 2018-08-27 (×7): 30 mL via ORAL
  Filled 2018-08-21 (×13): qty 30

## 2018-08-21 MED ORDER — SODIUM CHLORIDE 0.9 % IV SOLN: 125.0000 mg | INTRAVENOUS | Status: DC

## 2018-08-21 MED ORDER — SODIUM CHLORIDE 0.9% IV SOLUTION
Freq: Once | INTRAVENOUS | Status: AC
  Administered 2018-08-21: 18:00:00 via INTRAVENOUS

## 2018-08-21 MED ORDER — WARFARIN - PHARMACIST DOSING INPATIENT: Freq: Every day | Status: DC

## 2018-08-21 MED ORDER — WARFARIN SODIUM 5 MG PO TABS: 5.0000 mg | ORAL_TABLET | Freq: Once | ORAL | Status: DC

## 2018-08-21 MED ORDER — DARBEPOETIN ALFA 100 MCG/0.5ML IJ SOSY
PREFILLED_SYRINGE | INTRAMUSCULAR | Status: AC
  Administered 2018-08-21: 100 ug via INTRAVENOUS
  Filled 2018-08-21: qty 0.5

## 2018-08-21 MED ORDER — RENA-VITE PO TABS
1.0000 | ORAL_TABLET | Freq: Every day | ORAL | Status: DC
  Administered 2018-08-21 – 2018-09-03 (×14): 1 via ORAL
  Filled 2018-08-21 (×14): qty 1

## 2018-08-21 MED ORDER — HEPARIN (PORCINE) 25000 UT/250ML-% IV SOLN
2000.0000 [IU]/h | INTRAVENOUS | Status: DC
  Administered 2018-08-21: 1500 [IU]/h via INTRAVENOUS
  Administered 2018-08-22: 2000 [IU]/h via INTRAVENOUS
  Administered 2018-08-22: 1700 [IU]/h via INTRAVENOUS
  Administered 2018-08-23: 2000 [IU]/h via INTRAVENOUS
  Filled 2018-08-21 (×4): qty 250

## 2018-08-21 NOTE — Progress Notes (Addendum)
[facility] Progress Note   Subjective: Seen on HD. C/O pain L side. SBP low but asymptomatic.   Objective Vitals:   08/21/18 0715 08/21/18 0726 08/21/18 0731 08/21/18 0800  BP: (!) 106/48 (!) 98/48 (!) 93/44 (!) 81/37  Pulse: 96 96 97 (!) 101  Resp: 18     Temp: (!) 97.5 F (36.4 C)     TempSrc: Oral     SpO2: 96%     Weight: 132.4 kg     Height:       Physical Exam General: WN,WD NAD Heart: J4,G9 + mechanical click 1/6 systolic M Lungs: CTAB Anteriorly Abdomen: Active BS tenderness L side L flank. Non-distended Extremities: No LE edema Dialysis Access: L AVF cannulated at present.    Additional Objective Labs: Basic Metabolic Panel: Recent Labs  Lab 08/19/18 1513 08/20/18 0632 08/21/18 0503  NA 139 138 136  K 4.4 5.1 5.5*  CL 102 100 97*  CO2 21* 24 22  GLUCOSE 101* 106* 92  BUN 61* 72* 88*  CREATININE 10.60* 11.63* 12.82*  CALCIUM 8.3* 8.1* 8.7*  PHOS  --   --  7.7*   Liver Function Tests: Recent Labs  Lab 08/19/18 1513 08/21/18 0503  AST 49*  --   ALT 23  --   ALKPHOS 196*  --   BILITOT 1.3*  --   PROT 7.4  --   ALBUMIN 2.1* 1.9*   No results for input(s): LIPASE, AMYLASE in the last 168 hours. CBC: Recent Labs  Lab 08/19/18 1513 08/19/18 2251 08/20/18 0632 08/20/18 1507 08/21/18 0117 08/21/18 0503  WBC 19.1* 17.9* 16.4*  --  20.8* 21.4*  NEUTROABS 17.1*  --   --   --   --   --   HGB 7.6* 6.9* 6.5* 6.6* 7.4* 7.3*  HCT 25.5* 22.5* 20.9* 20.8* 23.2* 22.7*  MCV 86.1 82.1 82.9  --  81.4 82.2  PLT 636* 411* 383  --  399 391   Blood Culture    Component Value Date/Time   SDES AORTIC VALVE TISSUE 09/01/2017 1220   SPECREQUEST NONE 09/01/2017 1220   CULT  09/01/2017 1220    No growth aerobically or anaerobically. Performed at [facility], [place] [address], [place], [place] [address]    REPTSTATUS 09/06/2017 FINAL 09/01/2017 1220    Cardiac Enzymes: No results for input(s): CKTOTAL, CKMB, CKMBINDEX, TROPONINI in  the last 168 hours. CBG: No results for input(s): GLUCAP in the last 168 hours. Iron Studies: No results for input(s): IRON, TIBC, TRANSFERRIN, FERRITIN in the last 72 hours. @lablastinr3 @ Studies/Results: Dg Chest Port 1 View  Result Date: 08/19/2018 CLINICAL DATA:  Abdominal pain, bleeding of the penis EXAM: PORTABLE CHEST 1 VIEW COMPARISON:  08/01/2018 FINDINGS: There is no focal parenchymal opacity. There is no pleural effusion or pneumothorax. The heart and mediastinal contours are unremarkable. There is prior median sternotomy. The osseous structures are unremarkable. IMPRESSION: No active disease. Electronically Signed   By: Kathreen Devoid   On: 08/19/2018 18:49   Medications:  . acetaminophen  650 mg Oral Q6H  . amiodarone  200 mg Oral Daily  . atorvastatin  80 mg Oral q1800  . darbepoetin (ARANESP) injection - DIALYSIS  100 mcg Intravenous Q Tue-HD  . doxercalciferol  2 mcg Oral Q T,Th,Sat-1800  . lanthanum  1,000 mg Oral TID PC  . midodrine  15 mg Oral TID WC  . warfarin  5 mg Oral ONCE-1800  . Warfarin - Pharmacist Dosing Inpatient  Does not apply q1800     Hemodialysis prescription:TTS, [facility], 4 hours 45 minutes, left forearm AVF(15G), BFR 450, DFR 800, 2K/2.5 calcium. Heparin NONE . EDW 133.5 kg. Hectorol 2.0 mcg TIW, Parsabiv 2.5mg  TIW/ epo 6600  Assessment/Plan 1. Recurrent L perinephric hematoma with ^ INR( ho polycystic kidney disease)- Urology, admit team wu  HGB 6.5 on adm. S/P 3 units PRBCs. HGB 2. ESRD -  HD ON TTS /  K+5.5  HD today on schedule. No heparin.    3. Hypertension/volume  - Seen on HD-attempting 1.5 liters but hypotensive SBP 70s.. No evidence of volume overload. Run even. on midodrine 15 mg tid 4. AVR On Coumadin  With  Supratherapeutic INR 5.5 on admit-down to 1.7 today. Urology suggesting alternative to coumadin as he seems to have issues regulating INR. Per primary.  5. CAD/ CABG/ S CHF  Ho = cxr  nad  6. Anemia  Of esrd and acute bld  losses as above. Now status post 3 units PRBCs. HGB 7.3. Give aranesp 100 mcg IV today. Check iron panel next blood draw.  7. Metabolic bone disease - Ca 8.7 C Ca 10.4. Hectorol on hold. Phos elevated, resume binders.  8. Nutrition: Albumin 1.9. Advanced to renal diet. Add prostat, renal vits.    Rita H. Brown NP-C 08/21/2018, 8:36 AM  [employer] [phone]  I have seen and examined at HD unit. He is doing well. On midodrine for hypotension. Received prbc, check iron studies, continue ESA.  Next HD Thursday.  Lawson Radar, MD [facility]

## 2018-08-21 NOTE — Progress Notes (Signed)
ANTICOAGULATION CONSULT NOTE - Initial Consult  Pharmacy Consult for heparin Indication: Mechanical valve  No Known Allergies  Patient Measurements: Height: 6\' 1"  (185.4 cm) Weight: 292 lb 1.8 oz (132.5 kg) IBW/kg (Calculated) : 79.9  Vital Signs: Temp: 98.9 F (37.2 C) (05/05 1844) Temp Source: Oral (05/05 1844) BP: 83/45 (05/05 1844) Pulse Rate: 100 (05/05 1844)  Labs: Recent Labs    08/19/18 1513  08/20/18 1103  08/21/18 0117 08/21/18 0503 08/21/18 1656 08/21/18 2208  HGB 7.6*   < > 6.5*   < > 7.4* 7.3* 6.8*  --   HCT 25.5*   < > 20.9*   < > 23.2* 22.7* 21.2*  --   PLT 636*   < > 383  --  399 391 377  --   APTT 73*  --   --   --   --   --   --   --   LABPROT 49.1*  --  53.8*  --   --  20.0*  --   --   INR 5.5*  --  6.2*  --   --  1.7*  --   --   HEPARINUNFRC  --   --   --   --   --   --   --  0.15*  CREATININE 10.60*  --  11.63*  --   --  12.82*  --   --    < > = values in this interval not displayed.    Estimated Creatinine Clearance: 9.5 mL/min (A) (by C-G formula based on SCr of 12.82 mg/dL (H)).   Medical History: Past Medical History:  Diagnosis Date  . Aneurysm ([place])     Right arm fistula 3 aneurysms 2011,   plans to have a new procedure  . Angina   . Chest discomfort   . CHF (congestive heart failure) ([place])   . Coronary artery disease   . Coronary artery disease involving native coronary artery of native heart without angina pectoris   . Dialysis patient ([place])   . Dysrhythmia   . ESRD (end stage renal disease) ([place])    on hemodialysis T_T_S  . Hypertension   . Leg pain   . Mitral regurgitation   . Morbid obesity ([place])   . Orthostatic hypotension   . Overweight(278.02)   . Renal insufficiency   . 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 Severe aortic insufficiency   . Sinus tachycardia   . Syncope    positional after dialysis... 2008   Assessment: 27 YOM on warfarin PTA for mech aortic valve with subtherapeutic INR started on heparin with no bolus and tight goals d/t bleed risk with current hematuria.   Initial heparin level subtherapeutic at 0.15  Goal of Therapy:  Heparin level 0.3-0.5 units/ml Monitor platelets by anticoagulation protocol: Yes   Plan:  Increase heparin gtt to 1700 units/hr F/u 8 hour heparin level  Bertis Ruddy, PharmD Clinical Pharmacist Please check AMION for all [place] numbers 08/21/2018 10:54 PM

## 2018-08-21 NOTE — Progress Notes (Signed)
Patient ID: [name] [name], male   DOB: 12-Jun-1965, 53 y.o.   MRN: [id number]   IR Note  Pt with large left perinephric hematoma In setting of supra therapeutic INR Chronic coumadin AV replacement ESRD/PCKD  Pt stable this am per notes No more bleeding from urethra Hg 7.3  Last Tx 5/4 1757 BP 82/31  1030 am-- in dialysis  Under consideration for possible embolization in IR if necessary INR 1.7 this am  Pt is on IR Radar-- discussed with Dr Annamaria Boots Continue conservative management and observation If pt changes hemodynamically-- please call IR for possible intervention

## 2018-08-21 NOTE — Progress Notes (Signed)
When verifying patient's blood with Jay,RN, the blood unit number wouldn't scan in the computer. Called blood bank, and they said to bring the unit of blood back to the blood bank. Blood bank will call whenever correction of unit number is complete. Awaiting phone call.   Farley Ly RN

## 2018-08-21 NOTE — Progress Notes (Addendum)
ANTICOAGULATION CONSULT NOTE - Initial Consult  Pharmacy Consult for warfarin Indication: Mechanical aortic valve  No Known Allergies  Patient Measurements: Height: 6\' 1"  (185.4 cm) Weight: 291 lb 14.2 oz (132.4 kg) IBW/kg (Calculated) : 79.9 Heparin dosing weight: 109kg  Vital Signs: Temp: 97.5 F (36.4 C) (05/05 0715) Temp Source: Oral (05/05 0715) BP: 93/44 (05/05 0731) Pulse Rate: 97 (05/05 0731)  Labs: Recent Labs    08/19/18 1513  08/20/18 3419 08/20/18 1507 08/21/18 0117 08/21/18 0503  HGB 7.6*   < > 6.5* 6.6* 7.4* 7.3*  HCT 25.5*   < > 20.9* 20.8* 23.2* 22.7*  PLT 636*   < > 383  --  399 391  APTT 73*  --   --   --   --   --   LABPROT 49.1*  --  53.8*  --   --  20.0*  INR 5.5*  --  6.2*  --   --  1.7*  CREATININE 10.60*  --  11.63*  --   --  12.82*   < > = values in this interval not displayed.    Estimated Creatinine Clearance: 9.5 mL/min (A) (by C-G formula based on SCr of 12.82 mg/dL (H)).   Medical History: Past Medical History:  Diagnosis Date  . Aneurysm ([place])     Right arm fistula 3 aneurysms 2011,   plans to have a new procedure  . Angina   . Chest discomfort   . CHF (congestive heart failure) ([place])   . Coronary artery disease   . Coronary artery disease involving native coronary artery of native heart without angina pectoris   . Dialysis patient ([place])   . Dysrhythmia   . ESRD (end stage renal disease) ([place])    on hemodialysis T_T_S  . Hypertension   . Leg pain   . Mitral regurgitation   . Morbid obesity ([place])   . Orthostatic hypotension   . Overweight(278.02)   . Renal insufficiency   . 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 Severe aortic insufficiency   . Sinus tachycardia   . Syncope     positional after dialysis... 2008   Assessment: Patient with mechanical aortic valve replacement on warfarin  PTA. Last seen in [facility] on 4/23 with INR of 3.8, Warfarin was adjusted to 2.5mg  Sun, Tue, Thurs and 5mg  all other days. Patient admitted with c/o abdominal pain and hematuria with recent h/o left perinephric hematoma. Patient's INR on admission was 5.5 which subsequently increased to 6.2. Patent was given 5mg  of Vitamin K and with a decrease of INR to 1.7. Patient is now subtherapeutic.   D/W MD, team wishes to restart warfarin, but not heparin bridge due to current bleeding.   Goal of Therapy:  INR 2-3 (per [facility] clinic goal) Monitor platelets by anticoagulation protocol: Yes   Plan:  Warfarin 5mg  PO x 1 Daily INR, CBC Monitor for further bleeding Consider heparin when medically appropriate.   Lenoria Narine A. Levada Dy, PharmD, [place] Please utilize Amion for appropriate phone number to reach the unit pharmacist ([place])   Addendum:   Pharmacy received a consult to hold warfarin until 5/6 and to start a heparin drip. No further hematuria per attending note. Will shoot for lower end of goal range (0.3-0.5) per d/w MD.   Plan: Hold warfarin tonight  per MD Start heparin drip at 1500 units/hr, no bolus Goal Hl 0.3-0.5 per d/w MD Heparin level in 6 hours Daily Heparin level, CBC, INR Monitor for bleeding    08/21/2018,8:02 AM

## 2018-08-21 NOTE — Progress Notes (Signed)
Urology Progress Note      Subjective: Continued pain but reports overall improvement since admission. Reports no further bleeding per urethra. Reports continued poor appetite.   Objective: Vital signs in last 24 hours: Temp:  [97.5 F (36.4 C)-99 F (37.2 C)] 97.5 F (36.4 C) (05/05 0715) Pulse Rate:  [90-102] 101 (05/05 0800) Resp:  [18] 18 (05/05 0715) BP: (81-114)/(37-76) 81/37 (05/05 0800) SpO2:  [94 %-100 %] 96 % (05/05 0715) Weight:  [132.4 kg] 132.4 kg (05/05 0715)  Intake/Output from previous day: 05/04 0701 - 05/05 0700 In: 1363.1 [P.O.:560; Blood:752; IV Piggyback:51.1] Out: 0  Intake/Output this shift: No intake/output data recorded.  Physical Exam:  General: Alert and oriented CV: extremities warm and well perfused Lungs: NWOB Abdomen: Soft, tender in LUQ, nontender RUQ and suprapubic Ext: NT, No erythema  Lab Results: Recent Labs    08/20/18 1507 08/21/18 0117 08/21/18 0503  HGB 6.6* 7.4* 7.3*  HCT 20.8* 23.2* 22.7*   BMET Recent Labs    08/20/18 0632 08/21/18 0503  NA 138 136  K 5.1 5.5*  CL 100 97*  CO2 24 22  GLUCOSE 106* 92  BUN 72* 88*  CREATININE 11.63* 12.82*  CALCIUM 8.1* 8.7*     Studies/Results: Dg Chest Port 1 View  Result Date: 08/19/2018 CLINICAL DATA:  Abdominal pain, bleeding of the penis EXAM: PORTABLE CHEST 1 VIEW COMPARISON:  08/01/2018 FINDINGS: There is no focal parenchymal opacity. There is no pleural effusion or pneumothorax. The heart and mediastinal contours are unremarkable. There is prior median sternotomy. The osseous structures are unremarkable. IMPRESSION: No active disease. Electronically Signed   By: Kathreen Devoid   On: 08/19/2018 18:49    Assessment/Plan:  53 y.o. 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 This hematoma has increased in size since his repeat  imaging on 08/04/18, consistent with recurrent bleeding in the setting of a supratherapeutic INR. As he is hemodynamically stable and bleeding will likely subside once INR decreases into therapeutic range, we would favor conservative management for this initially. He is HDS and with improvement in INR, his hemoglobin has stabilized.   - With INR 1.7, consider starting IV heparin and monitoring for recurrent bleed.  - Continue bedrest, serial hemoglobins, vital signs, and transfusion as needed.  - Given recurrent issues with INR being supratherapeutic, ecommend nutrition consult. Patient will require very close follow-up with INR checks to ensure INR stays therapeutic.  - If patient deteriorates acutely or if he is refractory to the above management, would consider embolization but VIR.  - No strong indication for urethral catheter at this time as patient denies significant urinary complaints. Discussed possible placement if patient develops urinary complaints. He does have intravesical clot although it is okay for bladder to clot and reabsorb, as he is completely anuric at baseline - Patient will need to follow-up with urology as an outpatient for further imaging to ensure no underlying malignant renal mass as source of bleed.      LOS: 2 days   Dorothey Baseman 08/21/2018, 9:04 AM

## 2018-08-21 NOTE — Progress Notes (Addendum)
Internal Medicine Attending Attestation:   I have seen and evaluated this patient and I have discussed the plan of care with the house staff. Please see their note for complete details. I concur with their findings with the following additions/corrections:   Hemoglobin has finally stabilized after transfusing 3 units PRBCs and giving vitamin K yesterday to normalize INR, which is down to 1.7 today.  He reports no further hematuria since yesterday.  We will continue to trend H/H twice daily.  Management of his anticoagulation going forward is challenging.  Clearly he does not do well with supratherapeutic INR and will need very close follow-up.  We will discuss with his cardiothoracic surgeon if he should have a modified INR goal.  We will cautiously restart warfarin today at a lower dose and watch carefully for signs of recurrent bleeding.  Nephrology taking him for dialysis today, they also recommend checking iron levels given the amount of bleeding he has had.  Oda Kilts, MD 08/21/2018, 11:55 AM   ADDENDUM: For documentation purposes, please note he has chronic HFrEF that is stable at this time

## 2018-08-21 NOTE — Telephone Encounter (Signed)
Received a call from Dr. Annie Paras at Internal Medicine asking to speak with Dr. Bronson Ing in regards to mutual patient. Please call [phone].

## 2018-08-21 NOTE — Discharge Summary (Addendum)
Name: [name] [name] MRN: [id number] DOB: [birth year] 53 y.o. PCP: Patient, No Pcp Per  Date of Admission: 08/19/2018  2:28 PM Date of Discharge: 09/04/18 Attending Physician: Evette Doffing   Discharge Diagnosis: 1. L perinephric hematoma 2. Supratherapeutic INR on coumadin for AVR 3. ESRD on HD TTS 4. Chronic hypotension 5. Leukocytosis 6. Malnutrition  Discharge Medications: Allergies as of 09/04/2018   No Known Allergies     Medication List    TAKE these medications   amiodarone 200 MG tablet Commonly known as:  PACERONE Take 1 tablet (200 mg total) by mouth daily. What changed:  when to take this   atorvastatin 80 MG tablet Commonly known as:  LIPITOR Take 1 tablet (80 mg total) by mouth daily at 6 PM. What changed:  when to take this   HYDROmorphone 2 MG tablet Commonly known as:  Dilaudid Take 0.5 tablets (1 mg total) by mouth every 6 (six) hours as needed for up to 7 days for severe pain.   lanthanum 500 MG chewable tablet Commonly known as:  FOSRENOL Chew 500-1,000 mg by mouth See admin instructions. Take 2 tablets (1000 mg) by mouth three times daily with meals, take 1 tablet (500 mg) with snacks   midodrine 5 MG tablet Commonly known as:  PROAMATINE Take 3 tablets (15 mg total) by mouth 3 (three) times daily with meals. What changed:    how much to take  when to take this   SENSIPAR PO Take 1 tablet by mouth daily.   warfarin 1 MG tablet Commonly known as:  COUMADIN Take as directed. If you are unsure how to take this medication, talk to your nurse or doctor. Original instructions:  Take 1 tablet (1 mg total) by mouth every other day for 30 days. Start taking on:  Sep 05, 2018 What changed:    medication strength  how much to take  how to take this  when to take this  additional instructions       Disposition and follow-up:   KevinParam R Belshe [facility] in Good condition.  At the hospital follow up visit  please address:  1. L perinephric hematoma - hgb remained stable s/p embolization 5/13. 7.1 day of discharge  Repeat CBC.  2. Supratherapeutic INR on coumadin for AVR - continue to closely monitor; discharged on Coumadin 1 mg every other day. INR 3.0 upon discharge to CIR.  3. Malnutrition: continue to monitor for improvement; presumed secondary to mass effect from hematoma; may need further work-up if no improvement. Also endorsed chronic loose stools.   2.  Labs / imaging needed at time of follow-up: INR; CBC   3.  Pending labs/ test needing follow-up: none   Follow-up Appointments:   Hospital Course by problem list: 1. L perinephric hematoma: [name] [name] is a 53 yo man with CAD s/p CABG, CHF, ESRD on HD, polycystic kidney disease, and aortic valve replacement on Coumadin who presented withabdominalpain and bleeding from the penissecondary to an expanding left-sided perinephric hematoma from PCKD in the setting of supratherapeutic INR.He continued to have bleeding and fluctuating INR despite holding warfarin. He required transfusion of 7u of blood throughout his hospitalization. Repeat imaging showed mildly increased hematoma size. Underwent transcatheter embolization of the left kidney 5/13 with resolution of bleeding. Resumed warfarin with decreased INR goal of 2-2.5.   2. Supratherapeutic INR on coumadin for AVR: Patient continued to have elevated INR despite holding warfarin. May be 2/2 Vitamin K  deficiency in the setting of malnutrition. Resumed warfarin when bleeding ceased. INR goal 2-2.5. He will require more frequent INR monitoring in the outpatient setting to maintain tight control.   3. ESRD on HD TTS: Underwent HD per home schedule during hospitalization. Will resume at his outpatient HD center upon discharge.  4. Hypotension: Chronic hypotension with MAPs ranging 60-65s not uncommon. Continued home midodrine. Discontinued home metoprolol.  5. Leukocytosis: Resolved. 2/2 stress  reaction or resorption of known hematoma. No signs of infection.    6. Malnutrition: Poor appetite and PO intake in the setting of mass effect of perinephric hematoma on abdomen. Received supplemental shakes while inpatient. Patient also endorsed chronic loose stools. Encouraged to establish care with PCP to get further work-up.    Discharge Vitals:   BP (!) 86/61   Pulse (!) 101   Temp (!) 97.3 F (36.3 C) (Oral)   Resp 18   Ht 6\' 1"  (1.854 m)   Wt 130 kg   SpO2 98%   BMI 37.81 kg/m   Pertinent Labs, Studies, and Procedures:  CBC Latest Ref Rng & Units 09/04/2018 09/03/2018 09/02/2018  WBC 4.0 - 10.5 K/uL 9.4 9.7 10.3  Hemoglobin 13.0 - 17.0 g/dL 7.1(L) 7.1(L) 7.0(L)  Hematocrit 39.0 - 52.0 % 23.7(L) 23.6(L) 23.0(L)  Platelets 150 - 400 K/uL 330 337 319     Discharge Instructions: Discharge Instructions    Call MD for:  extreme fatigue   Complete by:  As directed    Call MD for:  persistant dizziness or light-headedness   Complete by:  As directed    Call MD for:  persistant nausea and vomiting   Complete by:  As directed    Call MD for:  persistant nausea and vomiting   Complete by:  As directed    Call MD for:  severe uncontrolled pain   Complete by:  As directed    Call MD for:  severe uncontrolled pain   Complete by:  As directed    Call MD for:  temperature >100.4   Complete by:  As directed    Call MD for:  temperature >100.4   Complete by:  As directed    Diet - low sodium heart healthy   Complete by:  As directed    Discharge instructions   Complete by:  As directed    [name] [name], it was a pleasure taking care of you. You were treated in the hospital for bleeding around your kidney which has resolved after the procedure. Your blood counts have been stable. Your INR level and Warfarin will need to continue to be monitored closely. We are recommending 1 mg every other day for now which can be adjusted as needed while you are in rehab.  I wish you all the best as  you continue to recover!   Increase activity slowly   Complete by:  As directed    Increase activity slowly   Complete by:  As directed       Signed: Delice Bison, DO 09/04/2018, 11:11 AM   Pager: [phone]

## 2018-08-21 NOTE — Telephone Encounter (Signed)
I spoke with her regarding patient's INR.

## 2018-08-21 NOTE — Progress Notes (Signed)
   Subjective: No overnight events. [name] [name] was seen in HD this morning. He reports that he is feeling better this morning and hasn't had any more bleeding from his urethra. He does continue to have left flank pain. All of his questions were addressed.   Objective:  Vital signs in last 24 hours: Vitals:   08/20/18 1754 08/20/18 1816 08/20/18 2036 08/21/18 0534  BP: 112/76 106/66 104/69 (!) 93/50  Pulse: 94 96 98 99  Resp: 18 18 18 18   Temp: 98.6 F (37 C) 98.9 F (37.2 C) 98.9 F (37.2 C) 98.5 F (36.9 C)  TempSrc: Oral Oral Oral Oral  SpO2: 97% 98% 96% 95%  Weight:      Height:       Gen: alert and oriented x3, no distress CV: RRR, mechanical systolic click Pulm: CTAB, normal effort on room air Abd: Bowel sounds present, soft, non-distended, non-tender Ext: No edema  Assessment/Plan:  Principal Problem:   Perinephric hematoma Active Problems:   ESRD (end stage renal disease) (HCC)   Morbid obesity (HCC)   Systolic CHF (HCC)   S/P aortic valve replacement with bileaflet mechanical valve   Acute blood loss anemia   Gross hematuria   Supratherapeutic INR  [name] [name] is a 53 yo man with CAD s/p CABG, CHF, ESRD on HD, polycystic kidney disease, and aortic valve replacement on Coumadin who presented withabdominalpain and bleeding from the penissecondary to an expanding left-sided perinephric hematoma from PCKD and supratherapeutic INR. Outside hospital CT demonstrated persistent and mildly increased hematoma, likely expanding due to supratherapeutic INR.  L perinephric hematoma: Second admission for this hematoma. Has required three blood transfusions since admission. Hb 7.3 this morning. His urethral bleeding has stopped. Both urology and IR agree with conservative management since patient is hemodynamically stable with consideration of angiography/embolization with IR if patient decompensates. INR 1.7 today after receiving Vitamin K. Will speak with patient's  cardiologist about adjusting INR goal. - Urology following, appreciate recs - IR consulted, appreciate [place] to resume warfarin with INR goal 2-3  - Fentanyl q3hrs prn for pain  - Bed rest - VTE ppx with SCDs - Trend CBC  AVR on Coumadin Supratherapeutic INR - Resume coumadin per pharmacy. INR goal 2-3. - Trend INR  ESRD on HD BMW:[id number] today. - HD per nephrology - Continue home fosrenol - Trend BMP   Hypotension:Blood pressures soft, but acceptable overnight. Will fluid bolus and recheck Hb if pressures worsen. - Continue home midodrine 15mg  TID - Discontinued home metoprolol   CAD s/p CABG Systolic CHF Echo on 0/27 showed an EF of 25%DGU grade 1 diastolic dysfunction - Continue home amiodarone 200mg  daily - Volume management with HD  Leukocytosis: White count increased from 16 to 21 today. No obvious signs of infection. May be caused by stress reaction or resorption of known hematoma.  - Trend CBC  Dispo: Anticipated discharge in approximately 2-3 day(s).   [name] [name], [name], MD 08/21/2018, 6:36 AM Pager: [phone]

## 2018-08-22 LAB — CBC
HCT: 23.4 % — ABNORMAL LOW (ref 39.0–52.0)
HCT: 23.4 % — ABNORMAL LOW (ref 39.0–52.0)
Hemoglobin: 7.4 g/dL — ABNORMAL LOW (ref 13.0–17.0)
Hemoglobin: 7.5 g/dL — ABNORMAL LOW (ref 13.0–17.0)
MCH: 26.3 pg (ref 26.0–34.0)
MCH: 26.9 pg (ref 26.0–34.0)
MCHC: 31.6 g/dL (ref 30.0–36.0)
MCHC: 32.1 g/dL (ref 30.0–36.0)
MCV: 83.3 fL (ref 80.0–100.0)
MCV: 83.9 fL (ref 80.0–100.0)
Platelets: 381 10*3/uL (ref 150–400)
Platelets: 384 10*3/uL (ref 150–400)
RBC: 2.81 MIL/uL — ABNORMAL LOW (ref 4.22–5.81)
RBC: 2.79 MIL/uL — ABNORMAL LOW (ref 4.22–5.81)
RDW: 16 % — ABNORMAL HIGH (ref 11.5–15.5)
RDW: 16.6 % — ABNORMAL HIGH (ref 11.5–15.5)
WBC: 13.8 10*3/uL — ABNORMAL HIGH (ref 4.0–10.5)
WBC: 16.8 10*3/uL — ABNORMAL HIGH (ref 4.0–10.5)
nRBC: 0 % (ref 0.0–0.2)
nRBC: 0 % (ref 0.0–0.2)

## 2018-08-22 LAB — TYPE AND SCREEN
ABO/RH(D): B POS
Antibody Screen: NEGATIVE
Unit division: 0
Unit division: 0
Unit division: 0
Unit division: 0

## 2018-08-22 LAB — RENAL FUNCTION PANEL
Albumin: 1.6 g/dL — ABNORMAL LOW (ref 3.5–5.0)
Anion gap: 18 — ABNORMAL HIGH (ref 5–15)
BUN: 44 mg/dL — ABNORMAL HIGH (ref 6–20)
CO2: 24 mmol/L (ref 22–32)
Calcium: 8.6 mg/dL — ABNORMAL LOW (ref 8.9–10.3)
Chloride: 94 mmol/L — ABNORMAL LOW (ref 98–111)
Creatinine, Ser: 7.77 mg/dL — ABNORMAL HIGH (ref 0.61–1.24)
GFR calc Af Amer: 8 mL/min — ABNORMAL LOW (ref >60)
GFR calc non Af Amer: 7 mL/min — ABNORMAL LOW (ref >60)
Glucose, Bld: 80 mg/dL (ref 70–99)
Phosphorus: 5.3 mg/dL — ABNORMAL HIGH (ref 2.5–4.6)
Potassium: 4.3 mmol/L (ref 3.5–5.1)
Sodium: 136 mmol/L (ref 135–145)

## 2018-08-22 LAB — HEPARIN LEVEL (UNFRACTIONATED)
Heparin Unfractionated: 0.18 IU/mL — ABNORMAL LOW (ref 0.30–0.70)
Heparin Unfractionated: 0.3 IU/mL (ref 0.30–0.70)
Heparin Unfractionated: 0.36 IU/mL (ref 0.30–0.70)

## 2018-08-22 LAB — PROTIME-INR
INR: 1.6 — ABNORMAL HIGH (ref 0.8–1.2)
Prothrombin Time: 18.5 seconds — ABNORMAL HIGH (ref 11.4–15.2)

## 2018-08-22 LAB — BPAM RBC
Blood Product Expiration Date: 202005092359
Blood Product Expiration Date: 202005102359
Blood Product Expiration Date: 202005182359
Blood Product Expiration Date: 202005182359
ISSUE DATE / TIME: 202005040126
ISSUE DATE / TIME: 202005040937
ISSUE DATE / TIME: 202005041749
ISSUE DATE / TIME: 202005052351
Unit Type and Rh: 1700
Unit Type and Rh: 7300
Unit Type and Rh: 7300
Unit Type and Rh: 7300

## 2018-08-22 LAB — IRON AND TIBC
Iron: 26 ug/dL — ABNORMAL LOW (ref 45–182)
Saturation Ratios: 21 % (ref 17.9–39.5)
TIBC: 126 ug/dL — ABNORMAL LOW (ref 250–450)
UIBC: 100 ug/dL

## 2018-08-22 LAB — FERRITIN: Ferritin: 4516 ng/mL — ABNORMAL HIGH (ref 24–336)

## 2018-08-22 MED ORDER — FENTANYL CITRATE (PF) 100 MCG/2ML IJ SOLN: 25.0000 ug | Freq: Four times a day (QID) | INTRAMUSCULAR | Status: DC | PRN

## 2018-08-22 MED ORDER — FENTANYL CITRATE (PF) 100 MCG/2ML IJ SOLN
25.0000 ug | Freq: Four times a day (QID) | INTRAMUSCULAR | Status: DC | PRN
  Administered 2018-08-22: 50 ug via INTRAVENOUS
  Filled 2018-08-22: qty 2

## 2018-08-22 MED ORDER — OXYCODONE HCL 5 MG PO TABS
5.0000 mg | ORAL_TABLET | Freq: Four times a day (QID) | ORAL | Status: DC | PRN
  Administered 2018-08-22 (×2): 5 mg via ORAL
  Filled 2018-08-22 (×2): qty 1

## 2018-08-22 MED ORDER — NEPRO/CARBSTEADY PO LIQD
237.0000 mL | Freq: Three times a day (TID) | ORAL | Status: DC
  Administered 2018-08-22 – 2018-08-23 (×2): 237 mL via ORAL
  Filled 2018-08-22 (×21): qty 237

## 2018-08-22 NOTE — Progress Notes (Addendum)
ANTICOAGULATION CONSULT NOTE - Initial Consult  Pharmacy Consult for heparin Indication: Mechanical valve  No Known Allergies  Patient Measurements: Height: 6\' 1"  (185.4 cm) Weight: 292 lb 5.3 oz (132.6 kg) IBW/kg (Calculated) : 79.9  Vital Signs: Temp: 98.7 F (37.1 C) (05/06 0700) Temp Source: Oral (05/06 0700) BP: 80/51 (05/06 0700) Pulse Rate: 92 (05/06 0700)  Labs: Recent Labs    08/19/18 1513  08/20/18 2751  08/21/18 0503 08/21/18 1656 08/21/18 2208 08/22/18 0625  HGB 7.6*   < > 6.5*   < > 7.3* 6.8*  --  7.4*  HCT 25.5*   < > 20.9*   < > 22.7* 21.2*  --  23.4*  PLT 636*   < > 383   < > 391 377  --  381  APTT 73*  --   --   --   --   --   --   --   LABPROT 49.1*  --  53.8*  --  20.0*  --   --  18.5*  INR 5.5*  --  6.2*  --  1.7*  --   --  1.6*  HEPARINUNFRC  --   --   --   --   --   --  0.15* 0.18*  CREATININE 10.60*  --  11.63*  --  12.82*  --   --   --    < > = values in this interval not displayed.    Estimated Creatinine Clearance: 9.5 mL/min (A) (by C-G formula based on SCr of 12.82 mg/dL (H)).   Medical History: Past Medical History:  Diagnosis Date  . Aneurysm ([place])     Right arm fistula 3 aneurysms 2011,   plans to have a new procedure  . Angina   . Chest discomfort   . CHF (congestive heart failure) ([place])   . Coronary artery disease   . Coronary artery disease involving native coronary artery of native heart without angina pectoris   . Dialysis patient ([place])   . Dysrhythmia   . ESRD (end stage renal disease) ([place])    on hemodialysis T_T_S  . Hypertension   . Leg pain   . Mitral regurgitation   . Morbid obesity ([place])   . Orthostatic hypotension   . Overweight(278.02)   . Renal insufficiency   . 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 Severe aortic insufficiency   . Sinus tachycardia   . Syncope    positional after dialysis... 2008   Assessment: 20 YOM on warfarin PTA for mech aortic valve with subtherapeutic INR started on heparin with no bolus and tight goals d/t bleed risk with current hematuria.   Heparin level this AM subtherapeutic at 0.18, INR 1.6 No bleeding documented, Hgb up to 7.4  Goal of Therapy:  Heparin level 0.3-0.5 units/ml Monitor platelets by anticoagulation protocol: Yes   Plan:  Increase heparin gtt to 2000 units/hr F/u 6 hour heparin level Daily Heparin level  Ednamae Schiano A. Levada Dy, PharmD, [place] Please utilize Amion for appropriate phone number to reach the unit pharmacist ([place])   08/22/2018 8:08 AM

## 2018-08-22 NOTE — Progress Notes (Signed)
ANTICOAGULATION CONSULT NOTE  Pharmacy Consult for heparin Indication: mechanical AVR  No Known Allergies  Patient Measurements: Height: 6\' 1"  (185.4 cm) Weight: 292 lb 5.3 oz (132.6 kg) IBW/kg (Calculated) : 79.9  Vital Signs: Temp: 98.1 F (36.7 C) (05/06 2123) BP: 114/75 (05/06 2123) Pulse Rate: 95 (05/06 2123)  Labs: Recent Labs    08/20/18 7939  08/21/18 0503 08/21/18 1656  08/22/18 0625 08/22/18 1218 08/22/18 1445 08/22/18 1848 08/22/18 2116  HGB 6.5*   < > 7.3* 6.8*  --  7.4*  --   --  7.5*  --   HCT 20.9*   < > 22.7* 21.2*  --  23.4*  --   --  23.4*  --   PLT 383   < > 391 377  --  381  --   --  384  --   LABPROT 53.8*  --  20.0*  --   --  18.5*  --   --   --   --   INR 6.2*  --  1.7*  --   --  1.6*  --   --   --   --   HEPARINUNFRC  --   --   --   --    < > 0.18*  --  0.30  --  0.36  CREATININE 11.63*  --  12.82*  --   --   --  7.77*  --   --   --    < > = values in this interval not displayed.    Estimated Creatinine Clearance: 15.7 mL/min (A) (by C-G formula based on SCr of 7.77 mg/dL (H)).   Assessment: 62 YOM on warfarin PTA for mech AVR with subtherapeutic INR started on heparin with no bolus and tight goals d/t bleed risk with current hematuria.    Heparin level is therapeutic at 0.36 on 2000 units/hr. No bleeding documented.  Goal of Therapy:  Heparin level 0.3-0.5 units/ml Monitor platelets by anticoagulation protocol: Yes   Plan:  Continue heparin gtt at 2000 units/hr Daily heparin level, CBC Monitor for s/sx of bleeding   Renold Genta, PharmD, BCPS Clinical Pharmacist Clinical phone for 08/22/2018 until 10p is [phone] 08/22/2018 9:48 PM  **Pharmacist phone directory can now be found on [email or web address] listed under [place]**

## 2018-08-22 NOTE — Discharge Instructions (Signed)
Vitamin K and Medications  What Do I Need to Know? If your doctor has prescribed coumadin or Warfarin to thin your blood, you need to watch how much vitamin K you get from food and dietary supplements.  Why Was Warfarin (Coumadin) Prescribed? Coumadin or Warfarin interferes with vitamin K so that your blood clots more slowly. The doctor uses a test called INR to make sure that your blood will not clot too quickly or too slowly. Changing how much vitamin K you get can change your INR. This change could result in bleeding or an unwanted blood clot.  How Does Warfarin Work? Take your medicine exactly as your doctor directed. Keep your vitamin K intake about the same. It is as simple as 1-2-3: 1. Keep your intake of high vitamin K foods consistent. You might plan to eat no more than  cup of these foods per day. If you like these foods and eat them often, you can eat more, but you should be consistent. For example, you could eat about a cup of one of these foods on most days. 2. Do not have large changes in the medium vitamin K foods you eat. For instance, it would not be wise to eat coleslaw at every meal and then stop eating it entirely. 3. Make careful decisions about dietary supplements. You can take a daily multivitamin (many contain 25 micrograms of vitamin K/tablet). If you do not regularly eat green vegetables, a multi-vitamin can be helpful. Remember to do it every day. Do not take supplements that contain large amounts of vitamin K (more than 100 micrograms/day).  Which Foods Have Vitamin K? Food Amount Vitamin K (mcg) Kale, cooked  cup 531 Spinach, cooked  cup 444 Collard greens, cooked  cup 418 Mustard greens, cooked  cup 210 Spinach, raw 1 cup 145 Broccoli, cooked  cup 110 Brussels sprouts  cup cooked 109 Lettuce, green leaf, raw 1 cup 97 Cabbage, cooked  cup 81 Lettuce, romaine, raw 1 cup 57 Asparagus 4 spears 48 Broccoli, raw  cup 45 Kiwi fruit 1 medium  31 Blackberries or blueberries, raw 1 cup 29 Pickles, cucumber, dill or kosher dill 1 pickle 25 Grapes (red or green) 1 cup 23 Peas, cooked  cup 19  Supplements Dietary supplements can affect how your blood clots. Use only supplements approved by your physician or registered dietitian. Generally, it is not wise to take vitamin E or fish oil supplements. Herbal supplements to avoid include alfalfa, arnica, bilberry, butchers broom, cats claw, dong quai, feverfew, forskolin, garlic, ginger, ginkgo, horse chestnut, inositol hexaphosphate, licorice, meililot (sweet clover), pau darco, red clover, St. Johns wort, sweet woodruff, turmeric, willow bark, and wheat grass. Your registered dietitian may restrict cranberry and grapefruit juice to no more than one to two servings per day since some studies have reported an increase in the effect of Warfarin. One serving equals 4 ounces juice or 1/2 grapefruit.

## 2018-08-22 NOTE — Progress Notes (Signed)
IR rounding note via telephone per new regulations. Spoke with Lanelle Bal, Therapist, sports.  Patient with history of left perinephric hematoma in setting of supra therapeutic INR (while on chronic coumadin), under possible consideration for IR embolization if necessary.  RN reports patient doing well today. No bleeding from urethra. Hemodynamically stable. Last transfusion 08/21/2018 at 17:26- hgb up to 7.4 since this (was at 6.8 prior to transfusion). INR 1.6 today.  Discussed case with Dr. Annamaria Boots who recommends to continue conservative management/observation. If patient changes hemodynamically, please call IR for possible intervention. IR to follow.   Bea Graff Louk, PA-C 08/22/2018, 9:59 AM

## 2018-08-22 NOTE — Progress Notes (Addendum)
Subjective:  Tolerated HD yest on schedule / this am denying flank discomfort, cob or cp.  Objective Vital signs in last 24 hours: Vitals:   08/22/18 0015 08/22/18 0250 08/22/18 0700 08/22/18 0937  BP: (!) 88/48 (!) 80/46 (!) 80/51 (!) 86/51  Pulse: 96 97 92 91  Resp: 18 18 18 18   Temp: 98.5 F (36.9 C) 98.1 F (36.7 C) 98.7 F (37.1 C) 97.8 F (36.6 C)  TempSrc: Oral Oral Oral Oral  SpO2: 100% 100% 98% 98%  Weight:      Height:       Weight change:   Physical Exam: General: Alert WN,WD male NAD Heart: N8,M7 + mechanical click 1/6 systolic M Lungs: CTAB Anteriorly Abdomen:obese,+ BS, Non tender /  Non-distended Extremities: No pedal edema Dialysis Access: L AVF Pos bruit    OP Hemodialysis :TTS, [facility], 4 hours 45 minutes, left forearm AVF(1 G), BFR 450, DFR 800, 2K/2.5 calcium. HeparinNONE. EDW 133.5kg. Hectorol2.19mcg TIW, Parsabiv 2.5mg  TIW/ epo 6600  Problem/Plan: 1.  Recurrent Lperinephric hematomawith ^ INR( hopolycystic kidney disease)-  HGB 6.5 on adm. S/P 4 units PRBCs so far . Am HGB 7.4  After 1 unit  For 6.8 hgb yest. Both urology and IR agree with conservative management since patient is hemodynamically stable with consideration ofangiography/embolization with IR if patient decompensates. 2. ESRD -HD ON TTS / K+5.5 HD today on schedule. No heparin.    3. Hypertension/volume -yest  on HD- hypotensive after  Attempt 1.5 liter  But only 500cc total uf SBP 70s.. No evidence of volume overload.  on midodrine 15 mg tid  tomor 1liter  eating better today  4. AVR On Coumadin With Supratherapeutic INR5.5 on admit-down to 1.6 today. Urology suggesting alternative to coumadin as he seems to have issues regulating INR. Per primary.  5. CAD/ CABG/ S CHF Ho = cxr nad  6. AnemiaOf esrd and acute bld losses as above. Now status post 4 units PRBCs. Am hgb HGB 7.4 Given aranesp 100 mcg  5/5 hd. TFS 21% with Ferritin 4,516   7. Metabolic bone disease  -Ca 8.7 C Ca 10.4. Hectorol on hold. Phos 7.7 , resume binders Fosrenol .  8. Nutrition: Albumin 1.9. Advanced to renal diet. Add prostat, renal vits  Ernest Haber, PA-C [facility] Beeper [phone] 08/22/2018,11:25 AM  LOS: 3 days   Labs: Basic Metabolic Panel: Recent Labs  Lab 08/19/18 1513 08/20/18 0632 08/21/18 0503  NA 139 138 136  K 4.4 5.1 5.5*  CL 102 100 97*  CO2 21* 24 22  GLUCOSE 101* 106* 92  BUN 61* 72* 88*  CREATININE 10.60* 11.63* 12.82*  CALCIUM 8.3* 8.1* 8.7*  PHOS  --   --  7.7*   Liver Function Tests: Recent Labs  Lab 08/19/18 1513 08/21/18 0503  AST 49*  --   ALT 23  --   ALKPHOS 196*  --   BILITOT 1.3*  --   PROT 7.4  --   ALBUMIN 2.1* 1.9*   No results for input(s): LIPASE, AMYLASE in the last 168 hours. No results for input(s): AMMONIA in the last 168 hours. CBC: Recent Labs  Lab 08/19/18 1513  08/20/18 0962  08/21/18 0117 08/21/18 0503 08/21/18 1656 08/22/18 0625  WBC 19.1*   < > 16.4*  --  20.8* 21.4* 17.8* 13.8*  NEUTROABS 17.1*  --   --   --   --   --   --   --   HGB 7.6*   < >  6.5*   < > 7.4* 7.3* 6.8* 7.4*  HCT 25.5*   < > 20.9*   < > 23.2* 22.7* 21.2* 23.4*  MCV 86.1   < > 82.9  --  81.4 82.2 81.5 83.3  PLT 636*   < > 383  --  399 391 377 381   < > = values in this interval not displayed.   Cardiac Enzymes: No results for input(s): CKTOTAL, CKMB, CKMBINDEX, TROPONINI in the last 168 hours. CBG: No results for input(s): GLUCAP in the last 168 hours.  Studies/Results: No results found. Medications: . heparin 2,000 Units/hr (08/22/18 0827)   . acetaminophen  650 mg Oral Q6H  . amiodarone  200 mg Oral Daily  . atorvastatin  80 mg Oral q1800  . darbepoetin (ARANESP) injection - DIALYSIS  100 mcg Intravenous Q Tue-HD  . feeding supplement (PRO-STAT SUGAR FREE 64)  30 mL Oral BID  . lanthanum  1,000 mg Oral TID PC  . midodrine  15 mg Oral TID WC  . multivitamin  1 tablet Oral QHS    I have seen and  examined patient at bedside.  I agree with above. ESRD on HD TTS, was hypotensive during dialysis yesterday.  Unable to remove UF.  Received red blood cell transfusion around 4 units.  No further bleeding per patient.  No symptoms today.  Continue midodrine.  Plan for next dialysis tomorrow.  Lawson Radar, MD [facility].

## 2018-08-22 NOTE — Progress Notes (Signed)
Urology Progress Note      Subjective: Reports pain is significantly improved, almost resolved. Reports no further bleeding per urethra. Received 1U pRBCs last night for Hgb 6.8.   Objective: Vital signs in last 24 hours: Temp:  [97.9 F (36.6 C)-99.1 F (37.3 C)] 98.7 F (37.1 C) (05/06 0700) Pulse Rate:  [92-102] 92 (05/06 0700) Resp:  [16-18] 18 (05/06 0700) BP: (58-104)/(25-54) 80/51 (05/06 0700) SpO2:  [94 %-100 %] 98 % (05/06 0700) Weight:  [132.5 kg-132.6 kg] 132.6 kg (05/05 2300)  Intake/Output from previous day: 05/05 0701 - 05/06 0700 In: 445 [P.O.:360; I.V.:85] Out: 0  Intake/Output this shift: No intake/output data recorded.  Physical Exam:  General: Alert and oriented CV: extremities warm and well perfused Lungs: NWOB Abdomen: Soft Ext: NT, No erythema  Lab Results: Recent Labs    08/21/18 0503 08/21/18 1656 08/22/18 0625  HGB 7.3* 6.8* 7.4*  HCT 22.7* 21.2* 23.4*   BMET Recent Labs    08/20/18 0632 08/21/18 0503  NA 138 136  K 5.1 5.5*  CL 100 97*  CO2 24 22  GLUCOSE 106* 92  BUN 72* 88*  CREATININE 11.63* 12.82*  CALCIUM 8.1* 8.7*     Studies/Results: No results found.  Assessment/Plan:  53 y.o. 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 This hematoma has increased in size since his repeat imaging on 08/04/18, consistent with recurrent bleeding in the setting of a supratherapeutic INR. As he is hemodynamically stable and bleeding will likely subside once INR decreases into therapeutic range, we would favor conservative management for this initially. He is HDS, but required additional transfusion last night due to Hgb 6.8. On IV heparin this AM.   - Continue monitoring for recurrent bleed on IV heparin. Hold warfarin until Hgb stabilizes.  - Continue bedrest, serial hemoglobins, vital signs,  and transfusion as needed.  - Given recurrent issues with INR being supratherapeutic, recommend nutrition consult. Patient will require very close follow-up with INR checks to ensure INR stays therapeutic.  - If patient deteriorates acutely or if he is refractory to the above management, would consider embolization by VIR, but would like to avoid this option if patient remains stable.  - No strong indication for urethral catheter at this time as patient denies significant urinary complaints. Discussed possible placement if patient develops urinary complaints. He does have intravesical clot although it is okay for bladder to clot and reabsorb, as he is completely anuric at baseline - Patient will need to follow-up with urology as an outpatient for further imaging to ensure no underlying malignant renal mass as source of bleed.      LOS: 3 days   Dorothey Baseman 08/22/2018, 8:37 AM

## 2018-08-22 NOTE — Progress Notes (Addendum)
Initial Nutrition Assessment  DOCUMENTATION CODES:   Obesity unspecified  INTERVENTION:    Nepro Shake po TID, each supplement provides 425 kcal and 19 grams protein  Continue Renal MVI  NUTRITION DIAGNOSIS:   Increased nutrient needs related to chronic illness(ESRD on HD) as evidenced by estimated needs.  GOAL:   Patient will meet greater than or equal to 90% of their needs  MONITOR:   PO intake, Supplement acceptance, Labs, Skin, I & O's, Weight trends  REASON FOR ASSESSMENT:   Consult Diet education  ASSESSMENT:   Patient with PMH significant for ESRD on HD, polycystic kidney disease, CAD s/p CABG, CHF, and s/p AVR. Recently hospitalized 4/15-4/21 for left-sided perinephric hematoma in setting of PCKD and ruptured hemorrhagic cyst. Presents this admission with increased perinephric hematoma.    RD working remotely.  Per nephrology pt's last HD was on 4/30 (outpatient). Pt skipped most recent HD due to feel poorly. Plan for inpatient treatment today.   Attempted to speak with pt via phone, no answer x2. Per H&P, pt has not ate well the last couple of days. Meal completions charted as 0% since admit. RD to provide supplementation to maximize calories and protein this stay. In previous admission pt reported he uses Zone protein bars and does not take liquid supplements. RD to monitor for toleration of Nepro and adjust accordingly. Will need to obtain history if possible.   RD consulted for Vitamin K and coumadin education. Handout "Vitamin K and Medications" from the [employer] left in discharge instructions.   EDW recorded as 133.5 kg. Current wt at 132.6 kg. Unsure if pt lost weight unintentionally PTA will need to confirm if possible.   Medications: aranesp, fosrenol, rena-vit  Labs: K 5.5 (wdl of HD) Phosphorus 7.7 (H) corrected calcium 10.4   Diet Order:   Diet Order            Diet renal with fluid restriction Fluid restriction: 1800 mL  Fluid; Room service appropriate? Yes; Fluid consistency: Thin  Diet effective now              EDUCATION NEEDS:   Not appropriate for education at this time  Skin:  Skin Assessment: Skin Integrity Issues: Skin Integrity Issues:: Other (Comment) Other: MASD- mid chest, left thigh AND open wound mid chest   Last BM:  5/3  Height:   Ht Readings from Last 1 Encounters:  08/21/18 6\' 1"  (1.854 m)    Weight:   Wt Readings from Last 1 Encounters:  08/21/18 132.6 kg    Ideal Body Weight:  83.6 kg  BMI:  Body mass index is 38.57 kg/m.  Estimated Nutritional Needs:   Kcal:  2400-2600 kcal   Protein:  120-135 grams  Fluid:  1000 ml + UOP   Mariana Single RD, LDN Clinical Nutrition Pager # [phone]

## 2018-08-22 NOTE — Progress Notes (Signed)
ANTICOAGULATION CONSULT NOTE - Initial Consult  Pharmacy Consult for heparin Indication: Mechanical valve  No Known Allergies  Patient Measurements: Height: 6\' 1"  (185.4 cm) Weight: 292 lb 5.3 oz (132.6 kg) IBW/kg (Calculated) : 79.9  Vital Signs: Temp: 97.8 F (36.6 C) (05/06 0937) Temp Source: Oral (05/06 0937) BP: 86/51 (05/06 0937) Pulse Rate: 91 (05/06 0937)  Labs: Recent Labs    08/20/18 1610  08/21/18 0503 08/21/18 1656 08/21/18 2208 08/22/18 0625 08/22/18 1218 08/22/18 1445  HGB 6.5*   < > 7.3* 6.8*  --  7.4*  --   --   HCT 20.9*   < > 22.7* 21.2*  --  23.4*  --   --   PLT 383   < > 391 377  --  381  --   --   LABPROT 53.8*  --  20.0*  --   --  18.5*  --   --   INR 6.2*  --  1.7*  --   --  1.6*  --   --   HEPARINUNFRC  --   --   --   --  0.15* 0.18*  --  0.30  CREATININE 11.63*  --  12.82*  --   --   --  7.77*  --    < > = values in this interval not displayed.    Estimated Creatinine Clearance: 15.7 mL/min (A) (by C-G formula based on SCr of 7.77 mg/dL (H)).   Medical History: Past Medical History:  Diagnosis Date  . Aneurysm ([place])     Right arm fistula 3 aneurysms 2011,   plans to have a new procedure  . Angina   . Chest discomfort   . CHF (congestive heart failure) ([place])   . Coronary artery disease   . Coronary artery disease involving native coronary artery of native heart without angina pectoris   . Dialysis patient ([place])   . Dysrhythmia   . ESRD (end stage renal disease) ([place])    on hemodialysis T_T_S  . Hypertension   . Leg pain   . Mitral regurgitation   . Morbid obesity ([place])   . Orthostatic hypotension   . Overweight(278.02)   . Renal insufficiency   . 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 Severe aortic insufficiency   . Sinus tachycardia   . Syncope     positional after dialysis... 2008    Assessment: 76 YOM on warfarin PTA for mech aortic valve with subtherapeutic INR started on heparin with no bolus and tight goals d/t bleed risk with current hematuria.   Heparin level this afternoon is therapeutic but at lowest end of range. Due to drop in hemoglobin, will not increase rate at this time.   No bleeding documented, Hgb up to 7.4  Goal of Therapy:  Heparin level 0.3-0.5 units/ml Monitor platelets by anticoagulation protocol: Yes   Plan:  Continue heparin gtt at 2000 units/hr F/u 6 hour heparin level Daily Heparin level  Mckinna Demars A. Levada Dy, PharmD, [place] Please utilize Amion for appropriate phone number to reach the unit pharmacist ([place])   08/22/2018 3:53 PM

## 2018-08-22 NOTE — Progress Notes (Addendum)
   Subjective: Hb dropped to 6.8 yesterday evening, requiring 1u blood transfusion. Hb 7.6 this am. Mr. Chavarria reports that he feels well this morning and his pain has almost completely resolved. He has not had significant urethral bleeding since admission to the hospital. He has remained hypotensive, but he denies chest pain or lightheadedness. He has no acute concerns today.  Objective:  Vital signs in last 24 hours: Vitals:   08/21/18 1808 08/21/18 1844 08/21/18 2300 08/21/18 2345  BP: (!) 85/45 (!) 83/45 (!) 58/25 (!) (P) 85/50  Pulse: 100 100 (!) 101 (P) 100  Resp: 16 18 17  (P) 18  Temp: 99.1 F (37.3 C) 98.9 F (37.2 C) 98.6 F (37 C) (P) 98.8 F (37.1 C)  TempSrc: Oral Oral Oral (P) Oral  SpO2: 99% 100% 94% (P) 100%  Weight:   132.6 kg   Height:   6\' 1"  (1.854 m)    Gen: alert and oriented x3, no distress CV: RRR, mechanical AVR click Pulm: CTAB, normal effort on room air Abd: soft, non-distended, non-tender, bowel sounds present Ext: no edema   Assessment/Plan:  Principal Problem:   Perinephric hematoma Active Problems:   ESRD (end stage renal disease) (HCC)   Morbid obesity (HCC)   Systolic CHF (HCC)   S/P aortic valve replacement with bileaflet mechanical valve   Acute blood loss anemia   Gross hematuria   Supratherapeutic INR  Mr. Wegener is a 53 yo man with CAD s/p CABG, CHF, ESRD on HD, polycystic kidney disease, and aortic valve replacement on Coumadin who presented withabdominalpain and bleeding from the penissecondary to an expanding left-sided perinephric hematoma from PCKD and supratherapeutic INR.Outside hospital CT demonstrated persistent and mildly increased hematoma,likely expandingdue tosupratherapeutic INR.  L perinephric hematoma: Second admission for this hematoma. Has required four blood transfusions since admission. Hb 7.6 this morning. His urethral bleeding has stopped. INR 1.6. Will continue heparin and hold off on warfarin in case  patient decompensates and requires intervention. Both urology and IR agree with conservative management since patient is hemodynamically stable with consideration of angiography/embolization with IR if patient decompensates. Discussed future INR goal with his cardiologist Dr. Bronson Ing who recommends adjusting goal to 2-2.5. - Urology following, appreciate recs - IR following, appreciate recs - Hold warfarin - Heparin per pharmacy - Discontinue fentanyl. Oxycodone 5mg  q6hrs prn for pain. - Bed rest - Trend CBC  AVR on Coumadin Supratherapeutic INR - Holding warfarin  - New goal will be 2-2.5 when resumed  Iron deficiency anemia: Iron low at 26 in the setting of acute blood loss and CKD. - Continue ESA/iron supplementation per nephro  ESRD on HD TTS - HD per nephrology - Continue home fosrenol - Trend BMP   Hypotension:Chronic hypotension with MAPs 60-65 for the past 24 hours. Asymptomatic. Will fluid bolus and recheck Hb if pressures worsen or patient becomes symptomatic. - Continue home midodrine 15mg  TID - Discontinued home metoprolol  CAD s/p CABG Systolic CHF Echo on 7/40 showed an EF of 81%KGY grade 1 diastolic dysfunction. No acute exacerbation. - Continue home amiodarone 200mg  daily - Volume management with HD  Leukocytosis: White count decreased from 21 to 13. No obvious signs of infection. May be caused bystress reaction orresorption of known hematoma.  - Trend CBC  Dispo: Patient will require continued hospitalization until his hemoglobin stabilizes and his warfarin is resumed and therapeutic.  Kalik Hoare, [name], MD 08/22/2018, 6:36 AM Pager: [phone]

## 2018-08-22 NOTE — Progress Notes (Signed)
Internal Medicine Attending Attestation:   I have seen and evaluated this patient and I have discussed the plan of care with the house staff. Please see their note for complete details. I concur with their findings with the following additions/corrections:   Hemoglobin dropped again yesterday afternoon, was transfused another 1 unit PRBCs overnight.  Reports pain is almost resolved.  Was hypotensive on dialysis yesterday and unable to do any UF.  We will need to continue with heparin drip until he proves his hemoglobin can remained stable without ongoing transfusion.  INR is 1.6 today.  Once hemoglobin has been stable for more than 24 hours, we will restart warfarin at a lower dose and see if he can tolerate a therapeutic INR without further bleeding.  Appreciate IR and urology following, they agree on conservative management for now.  Oda Kilts, MD 08/22/2018, 2:41 PM

## 2018-08-23 LAB — CBC
HCT: 24.2 % — ABNORMAL LOW (ref 39.0–52.0)
HCT: 22.6 % — ABNORMAL LOW (ref 39.0–52.0)
Hemoglobin: 7.5 g/dL — ABNORMAL LOW (ref 13.0–17.0)
Hemoglobin: 7.3 g/dL — ABNORMAL LOW (ref 13.0–17.0)
MCH: 26.1 pg (ref 26.0–34.0)
MCH: 27.3 pg (ref 26.0–34.0)
MCHC: 31 g/dL (ref 30.0–36.0)
MCHC: 32.3 g/dL (ref 30.0–36.0)
MCV: 84.3 fL (ref 80.0–100.0)
MCV: 84.6 fL (ref 80.0–100.0)
Platelets: 369 10*3/uL (ref 150–400)
Platelets: 365 10*3/uL (ref 150–400)
RBC: 2.87 MIL/uL — ABNORMAL LOW (ref 4.22–5.81)
RBC: 2.67 MIL/uL — ABNORMAL LOW (ref 4.22–5.81)
RDW: 16.8 % — ABNORMAL HIGH (ref 11.5–15.5)
RDW: 17.2 % — ABNORMAL HIGH (ref 11.5–15.5)
WBC: 15.6 10*3/uL — ABNORMAL HIGH (ref 4.0–10.5)
WBC: 15.9 10*3/uL — ABNORMAL HIGH (ref 4.0–10.5)
nRBC: 0 % (ref 0.0–0.2)
nRBC: 0 % (ref 0.0–0.2)

## 2018-08-23 LAB — RENAL FUNCTION PANEL
Albumin: 1.6 g/dL — ABNORMAL LOW (ref 3.5–5.0)
Anion gap: 13 (ref 5–15)
BUN: 56 mg/dL — ABNORMAL HIGH (ref 6–20)
CO2: 26 mmol/L (ref 22–32)
Calcium: 8.4 mg/dL — ABNORMAL LOW (ref 8.9–10.3)
Chloride: 97 mmol/L — ABNORMAL LOW (ref 98–111)
Creatinine, Ser: 8.93 mg/dL — ABNORMAL HIGH (ref 0.61–1.24)
GFR calc Af Amer: 7 mL/min — ABNORMAL LOW (ref >60)
GFR calc non Af Amer: 6 mL/min — ABNORMAL LOW (ref >60)
Glucose, Bld: 78 mg/dL (ref 70–99)
Phosphorus: 6 mg/dL — ABNORMAL HIGH (ref 2.5–4.6)
Potassium: 4.5 mmol/L (ref 3.5–5.1)
Sodium: 136 mmol/L (ref 135–145)

## 2018-08-23 LAB — HEPARIN LEVEL (UNFRACTIONATED): Heparin Unfractionated: 0.43 IU/mL (ref 0.30–0.70)

## 2018-08-23 LAB — PROTIME-INR
INR: 2 — ABNORMAL HIGH (ref 0.8–1.2)
INR: 2.5 — ABNORMAL HIGH (ref 0.8–1.2)
Prothrombin Time: 22.3 seconds — ABNORMAL HIGH (ref 11.4–15.2)
Prothrombin Time: 26.7 seconds — ABNORMAL HIGH (ref 11.4–15.2)

## 2018-08-23 MED ORDER — FENTANYL CITRATE (PF) 100 MCG/2ML IJ SOLN
25.0000 ug | Freq: Four times a day (QID) | INTRAMUSCULAR | Status: DC | PRN
  Administered 2018-08-26: 50 ug via INTRAVENOUS
  Filled 2018-08-23: qty 2

## 2018-08-23 MED ORDER — HYDROMORPHONE HCL 2 MG PO TABS
1.0000 mg | ORAL_TABLET | Freq: Four times a day (QID) | ORAL | Status: DC | PRN
  Administered 2018-08-23 – 2018-08-28 (×11): 1 mg via ORAL
  Filled 2018-08-23 (×11): qty 1

## 2018-08-23 MED ORDER — MIDODRINE HCL 5 MG PO TABS
ORAL_TABLET | ORAL | Status: AC
  Filled 2018-08-23: qty 3

## 2018-08-23 NOTE — Progress Notes (Addendum)
Subjective:  On Hd ,, "last night turned on my side and Flank discomfort returned" / Currently on Hd No pain or sob   Objective Vital signs in last 24 hours: Vitals:   08/23/18 0756 08/23/18 0800 08/23/18 0815 08/23/18 0830  BP: (!) 97/59 90/61 97/63  100/62  Pulse: 96 96 96 95  Resp: (!) 23 (!) 21  (!) 24  Temp:      TempSrc:      SpO2:      Weight:      Height:       Weight change: -1 kg  Physical Exam: General:Alert WN,WD male NAD JOACZ:Y6,A6 + mechanical click 1/6 systolic M Lungs:CTAB Anteriorly Abdomen:obese,+ BS, Non tender /  Non-distended Extremities:No pedal edema Dialysis Access:L AVF Pos bruit    OP Hemodialysis :TTS, [facility], 4 hours 45 minutes, left forearm AVF(1 G), BFR 450, DFR 800, 2K/2.5 calcium. HeparinNONE. EDW 133.5kg. Hectorol2.32mcg TIW, Parsabiv 2.5mg  TIW/ epo 6600  Problem/Plan: 1.  Recurrent Lperinephric hematomawith ^ INR( hopolycystic kidney disease)-  HGB 6.5on adm. S/P 4 units PRBCs so far . Am HGB 7.5 . Both urology and IR agree with conservative management since patient is hemodynamically stable with consideration ofangiography/embolization with IR if patient decompensates. 2. ESRD -HD ON TTS / K+4.5 HD today on schedule. No heparin. 3. Hypertension/volume -last hd 5/05   on HD- hypotensive after  Attempt 1.5 liter  But only 500cc total uf SBP 70s.. No evidence of volume overload. on midodrine 15 mg tid  today 1liter was  eating better yest 4. AVR On Coumadin With Supratherapeutic INR5.5 on admit-down to 2.0  today. Urology suggesting alternative to coumadin as he seems to have issues regulating INR. Per primary. 5. CAD/ CABG/ S CHF Ho = cxr nad  6. AnemiaOf esrd and acute bld losses as above. Now status post 4 units PRBCs. Am hgb HGB 7.4 Given aranesp 100 mcg  5/5 hd. TFS 21% with Ferritin 4,516  7. Metabolic bone disease -Ca 8.4 C Ca 10.2. Hectorol on hold. Phos 6.0 , resume binders Fosrenol .   8. Nutrition: Albumin 1.6 Advanced to renal diet. Add prostat, renal vits  Ernest Haber, PA-C [facility] Beeper [phone] 08/23/2018,8:40 AM  LOS: 4 days   Labs: Basic Metabolic Panel: Recent Labs  Lab 08/21/18 0503 08/22/18 1218 08/23/18 0800  NA 136 136 136  K 5.5* 4.3 4.5  CL 97* 94* 97*  CO2 22 24 26   GLUCOSE 92 80 78  BUN 88* 44* 56*  CREATININE 12.82* 7.77* 8.93*  CALCIUM 8.7* 8.6* 8.4*  PHOS 7.7* 5.3* 6.0*   Liver Function Tests: Recent Labs  Lab 08/19/18 1513 08/21/18 0503 08/22/18 1218 08/23/18 0800  AST 49*  --   --   --   ALT 23  --   --   --   ALKPHOS 196*  --   --   --   BILITOT 1.3*  --   --   --   PROT 7.4  --   --   --   ALBUMIN 2.1* 1.9* 1.6* 1.6*   No results for input(s): LIPASE, AMYLASE in the last 168 hours. No results for input(s): AMMONIA in the last 168 hours. CBC: Recent Labs  Lab 08/19/18 1513  08/21/18 0503 08/21/18 1656 08/22/18 0625 08/22/18 1848 08/23/18 0435  WBC 19.1*   < > 21.4* 17.8* 13.8* 16.8* 15.6*  NEUTROABS 17.1*  --   --   --   --   --   --  HGB 7.6*   < > 7.3* 6.8* 7.4* 7.5* 7.5*  HCT 25.5*   < > 22.7* 21.2* 23.4* 23.4* 24.2*  MCV 86.1   < > 82.2 81.5 83.3 83.9 84.3  PLT 636*   < > 391 377 381 384 369   < > = values in this interval not displayed.   Cardiac Enzymes: No results for input(s): CKTOTAL, CKMB, CKMBINDEX, TROPONINI in the last 168 hours. CBG: No results for input(s): GLUCAP in the last 168 hours.  Studies/Results: No results found. Medications: . heparin 2,000 Units/hr (08/22/18 2200)   . acetaminophen  650 mg Oral Q6H  . amiodarone  200 mg Oral Daily  . atorvastatin  80 mg Oral q1800  . darbepoetin (ARANESP) injection - DIALYSIS  100 mcg Intravenous Q Tue-HD  . feeding supplement (NEPRO CARB STEADY)  237 mL Oral TID BM  . feeding supplement (PRO-STAT SUGAR FREE 64)  30 mL Oral BID  . lanthanum  1,000 mg Oral TID PC  . midodrine  15 mg Oral TID WC  . multivitamin  1 tablet  Oral QHS    Nephrology attending: I have seen and examined this patient in dialysis unit.  I discussed with PA and I agree with his assessment and plan as above. Hemoglobin is stable with no sign of further bleeding.  Continue TTS dialysis schedule.  Monitor electrolytes.  Patient is asymptomatic.  Lawson Radar, MD [facility]

## 2018-08-23 NOTE — Progress Notes (Signed)
Patient ID: [name] [name], male   DOB: 05-Oct-1965, 53 y.o.   MRN: [id number]    Subjective: Pt states that left flank pain improving.  No blood per urethra yesterday. Remains on heparin drip. Dialysis planned for today.  Objective: Vital signs in last 24 hours: Temp:  [97.8 F (36.6 C)-98.8 F (37.1 C)] 98.8 F (37.1 C) (05/07 0434) Pulse Rate:  [91-100] 100 (05/07 0434) Resp:  [16-18] 18 (05/07 0434) BP: (80-114)/(51-75) 93/66 (05/07 0434) SpO2:  [92 %-100 %] 92 % (05/07 0434) Weight:  [131.4 kg] 131.4 kg (05/07 0051)  Intake/Output from previous day: 05/06 0701 - 05/07 0700 In: 957.4 [P.O.:400; I.V.:557.4] Out: 0  Intake/Output this shift: Total I/O In: 415.9 [I.V.:415.9] Out: 0   Physical Exam:  General: Alert and oriented Abd: Moderate left CVAT and left abdominal tenderness  Lab Results: Recent Labs    08/22/18 0625 08/22/18 1848 08/23/18 0435  HGB 7.4* 7.5* 7.5*  HCT 23.4* 23.4* 24.2*   BMET Recent Labs    08/21/18 0503 08/22/18 1218  NA 136 136  K 5.5* 4.3  CL 97* 94*  CO2 22 24  GLUCOSE 92 80  BUN 88* 44*  CREATININE 12.82* 7.77*  CALCIUM 8.7* 8.6*     Studies/Results: No results found.  Assessment/Plan: 1) Left perinephric hematoma: Hgb now stable for over 24 hrs without needing a transfusion on therapeutic anticoagulation.  Ok to resume ambulation.  Continue serial Hgb measurements.  Could restart warfarin gradually beginning tonight if he remains hemodynamically stable throughout the day. Will need close monitoring of INR after discharge.  Will also need repeat imaging of the left kidney in about 3 months to assess for any possible underlying renal mass of concern.  Will continue to follow.   LOS: 4 days   Dutch Gray 08/23/2018, 6:51 AM

## 2018-08-23 NOTE — Procedures (Signed)
Patient was seen on dialysis and the procedure was supervised.  BFR 400  Via AVF BP is  99/61.   Patient appears to be tolerating treatment well  Dron Tanna Furry 08/23/2018

## 2018-08-23 NOTE — Progress Notes (Signed)
   IR Note  Pt with large left perinephric hematoma In setting of supra therapeutic INR Chronic coumadin AV replacement ESRD/PCKD  Pt stable this am per notes No more bleeding from urethra Hg 7.5-- no transfusion in 24 hrs  VSS BP 93/66  INR 2.0 this am  Dr Windle Guard note this am Assessment/Plan: 1) Left perinephric hematoma: Hgb now stable for over 24 hrs without needing a transfusion on therapeutic anticoagulation.  Ok to resume ambulation.  Continue serial Hgb measurements.  Could restart warfarin gradually beginning tonight if he remains hemodynamically stable throughout the day. Will need close monitoring of INR after discharge.  Will also need repeat imaging of the left kidney in about 3 months to assess for any possible underlying renal mass of concern.  Will continue to follow.  Continue conservative management and observation Please call IR if need our service

## 2018-08-23 NOTE — Progress Notes (Signed)
Internal Medicine Attending Attestation:   I have seen and evaluated this patient and I have discussed the plan of care with the house staff. Please see their note for complete details. I concur with their findings with the following additions/corrections:   Some worsened pain overnight with rolling onto his side, but otherwise feels well this morning.  Hemoglobin has remained stable without further transfusion.  Perplexing that INR is up to 2.0 this morning without resuming warfarin, will continue heparin drip and plan to restart warfarin with assistance from pharmacy with lower dose than before and goal INR 2-2.5.  Iron studies consistent with anemia of chronic disease/renal disease and acute blood loss, continue ESA but no need for iron supplementation at this time  Oda Kilts, MD 08/23/2018, 1:29 PM

## 2018-08-23 NOTE — Progress Notes (Signed)
   Subjective: No overnight events. Pt is seen in HD this am. [name] [name] reports that he rolled onto his left side for a bath yesterday, which exacerbated his left flank pain. His pain has improved this morning. He still has a poor appetite, but report regular BMs. He has been drinking the supplement shakes recommended by nutrition.   Objective:  Vital signs in last 24 hours: Vitals:   08/22/18 1657 08/22/18 2123 08/23/18 0051 08/23/18 0434  BP: 112/64 114/75  93/66  Pulse: 95 95  100  Resp:  16  18  Temp:  98.1 F (36.7 C)  98.8 F (37.1 C)  TempSrc:    Oral  SpO2:  100%  92%  Weight:   131.4 kg   Height:       Gen: alert and oriented x3, no distress CV: RRR, mechanical AV click Pulm: CTAB in the anterior fields Abd: soft, non-distended, non-tender, bs+ Ext: no edema  Assessment/Plan:  Principal Problem:   Perinephric hematoma Active Problems:   ESRD (end stage renal disease) (HCC)   Morbid obesity (HCC)   Systolic CHF (HCC)   S/P aortic valve replacement with bileaflet mechanical valve   Acute blood loss anemia   Gross hematuria   Supratherapeutic INR  Mr. Brouwer is a 53 yo man with CAD s/p CABG, CHF, ESRD on HD, polycystic kidney disease, and aortic valve replacement on Coumadin who presented withabdominalpain and bleeding from the penissecondary to an expanding left-sided perinephric hematoma from PCKD and supratherapeutic INR.Outside hospital CT demonstrated persistent and mildly increased hematoma,likely expandingdue tosupratherapeutic INR.  L perinephric hematoma: Has required four blood transfusions since admission. Hb 7.5 this am, has now been stable for 24 hours. No further urethral bleeding. INR 2.0. Currently therapeutic on heparin. Will resume warfarin today with INR goal 2-2.5. Both urology and IR agree with conservative management at this time because patient is hemodynamically stable. Considerangiography/embolization with IR if patient decompensates  (INR goal <1.5 for procedure).  - Urology following, appreciate recs - IR following, appreciate recs - Resume warfarin per pharmacy, likely at reduced dose. INR goal 2-2.5. - Continue heparin until INR is consistently >2 - Pain control: PO dilaudid1mg  q6hrs prn with IV fentanyl q6hrs for breakthrough - May ambulate - Trend CBC  AVR on Coumadin Supratherapeutic INR -Resume warfarin with INR goal 2-2.5  Iron deficiency anemia: Iron low at 26 in the setting of acute blood loss and CKD. - Continue ESA/iron supplementation per nephro  ESRD on HD TTS -HD per nephrology - Continue home fosrenol - Trend BMP   Hypotension:Chronic hypotension with MAPs 60-65. Asymptomatic. Blood pressures have been better today in the 54S-568L systolic. Will fluid bolus and recheck Hb if pressures worsen or patient becomes symptomatic. - Continue home midodrine 15mg  TID -Discontinued homemetoprolol  CAD s/p CABG Systolic CHF Echo on 2/75 showed an EF of 17%GYF grade 1 diastolic dysfunction. No acute exacerbation. - Continue home amiodarone 200mg  daily - Volume management with HD  Leukocytosis: White count 15 today. No obvious signs of infection. May be caused bystress reaction orresorption of known hematoma.  - Trend CBC  Dispo: Anticipated discharge in approximately 2-3 days.  Sharan Mcenaney, [name], MD 08/23/2018, 6:17 AM Pager: [phone]

## 2018-08-23 NOTE — Consult Note (Addendum)
   [facility] CM Inpatient Consult   08/23/2018  [name] 07-07-1965 [id number]     1708: Thank you for the referral! (received referral from Inpatient TOC CM through [place]). Please see note below...   Patient was screened for24% high risk score for unplanned readmissions andhospitalizations with his Medicare plan andto check if potential [place] care management services needed.Had 30 day readmission, 2 hospitalizations and 1 ED visit in the past 6 months. Currently, patient does not have a primary care provider listed.  Patient presented with abdominal pain and bleeding from the penis. He was recently hospitalized from 4/15 to 4/21 for a left-sided perinephric hematoma in the setting of PCKD with ruptured hemorrhagic cyst. This is his second admission for this hematoma, (large left perinephric hematoma-recurrent in setting of supra therapeutic INR,Chronic coumadin AV replacement, ESRD/PCKD) and had required blood transfusions.  History and Physical on 08/19/18 shows as: [name] is a 53 y/o male with CAD s/p CABG, CHF, ESRD on HD, polycystic kidney disease, and aortic valve replacement on Coumadin.    Patient is currently noteligible for [facility] Care Management services since he has no primary care provider listed.  Will follow up with inpatient [facility] team regarding any [facility] Care Management needs, if a primary care provider is found. Message was left for TOC CM.   For questions and additional information, please call:  Jarrod Bodkins A. Advith Martine, BSN, RN-BC [facility] Liaison Cell: [phone]

## 2018-08-23 NOTE — Progress Notes (Signed)
Patient had dried dark blood around perineal area and 3 runs of unsustained V-tach. Alfonse Spruce, MD notified. Will continue to monitor.

## 2018-08-23 NOTE — Progress Notes (Addendum)
Notified MD  Dorrell that pt is bleeding from penis. Spoke with MD on the phone. Pt states he has pian 10/10 mid abdomen and this is a change since his pain was constantly in his sides. VS- BP 102/59 HR 101 and 95% O2 saturation. MD stated she would assess the pt.   Paulla Fore, RN

## 2018-08-23 NOTE — Care Management Important Message (Signed)
Important Message  Patient Details  Name: [name] [name] MRN: [id number] Date of Birth: 11-17-1965   Medicare Important Message Given:  Yes    Ethie Curless 08/23/2018, 1:56 PM

## 2018-08-23 NOTE — Progress Notes (Addendum)
ANTICOAGULATION CONSULT NOTE - Initial Consult  Pharmacy Consult for heparin/warfarin Indication: Mechanical valve  No Known Allergies  Patient Measurements: Height: 6\' 1"  (185.4 cm) Weight: 291 lb 14.2 oz (132.4 kg) IBW/kg (Calculated) : 79.9  Vital Signs: Temp: 98.1 F (36.7 C) (05/07 0750) Temp Source: Oral (05/07 0750) BP: 92/61 (05/07 0930) Pulse Rate: 97 (05/07 0930)  Labs: Recent Labs    08/21/18 0503  08/22/18 0625 08/22/18 1218 08/22/18 1445 08/22/18 1848 08/22/18 2116 08/23/18 0435 08/23/18 0800 08/23/18 0831  HGB 7.3*   < > 7.4*  --   --  7.5*  --  7.5*  --   --   HCT 22.7*   < > 23.4*  --   --  23.4*  --  24.2*  --   --   PLT 391   < > 381  --   --  384  --  369  --   --   LABPROT 20.0*  --  18.5*  --   --   --   --  22.3*  --   --   INR 1.7*  --  1.6*  --   --   --   --  2.0*  --   --   HEPARINUNFRC  --    < > 0.18*  --  0.30  --  0.36  --   --  0.43  CREATININE 12.82*  --   --  7.77*  --   --   --   --  8.93*  --    < > = values in this interval not displayed.    Estimated Creatinine Clearance: 13.7 mL/min (A) (by C-G formula based on SCr of 8.93 mg/dL (H)).   Medical History: Past Medical History:  Diagnosis Date  . Aneurysm ([place])     Right arm fistula 3 aneurysms 2011,   plans to have a new procedure  . Angina   . Chest discomfort   . CHF (congestive heart failure) ([place])   . Coronary artery disease   . Coronary artery disease involving native coronary artery of native heart without angina pectoris   . Dialysis patient ([place])   . Dysrhythmia   . ESRD (end stage renal disease) ([place])    on hemodialysis T_T_S  . Hypertension   . Leg pain   . Mitral regurgitation   . Morbid obesity ([place])   . Orthostatic hypotension   . Overweight(278.02)   . Renal insufficiency   . 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  Severe aortic insufficiency   . Sinus tachycardia   . Syncope     positional after dialysis... 2008   Assessment: 79 YOM on warfarin PTA for mech aortic valve with subtherapeutic INR started on heparin with no bolus and tight goals d/t bleed risk with current hematuria.   Heparin level this morning is therapeutic at 0.43.   Last dose warfarin was on 5/2 and patient is s/p 5mg  Vitamin K. INR today however is 2 despite that. Goal has been decreased to 2-2.5.  I am hesitant to give warfarin today with therapeutic INR off warfarin. Will repeat INR later today. Would continue heparin for now until INR consistently >2. Hemoglobin stable.   No bleeding documented, Hgb up to 7.5  Goal of Therapy:  Heparin level 0.3-0.5 units/ml Monitor platelets by anticoagulation  protocol: Yes   Plan:  Continue heparin gtt at 2000 units/hr F/u repeat INR Daily Heparin level, PT/INR  Deionna Marcantonio A. Levada Dy, PharmD, [place] Please utilize Amion for appropriate phone number to reach the unit pharmacist ([place])  Addendum:   1200 INR is actually increased from 2 to 2.5 (Goal 2-2.5) without warfarin since 5/2 and s/p 5mg  Vit K. D/W MD who shared that patient is now bleeding again. Agree that heparin should be stopped for now. Hold warfarin at this time. Pharmacy will continue to follow.   Plan:  Hold warfarin Hold heparin drip D/c daily HL for now Daily INR    08/23/2018 9:46 AM

## 2018-08-24 LAB — CBC
HCT: 23.4 % — ABNORMAL LOW (ref 39.0–52.0)
HCT: 22.2 % — ABNORMAL LOW (ref 39.0–52.0)
Hemoglobin: 7.1 g/dL — ABNORMAL LOW (ref 13.0–17.0)
Hemoglobin: 7 g/dL — ABNORMAL LOW (ref 13.0–17.0)
MCH: 26.3 pg (ref 26.0–34.0)
MCH: 26.7 pg (ref 26.0–34.0)
MCHC: 30.3 g/dL (ref 30.0–36.0)
MCHC: 31.5 g/dL (ref 30.0–36.0)
MCV: 86.7 fL (ref 80.0–100.0)
MCV: 84.7 fL (ref 80.0–100.0)
Platelets: 327 10*3/uL (ref 150–400)
Platelets: 350 10*3/uL (ref 150–400)
RBC: 2.7 MIL/uL — ABNORMAL LOW (ref 4.22–5.81)
RBC: 2.62 MIL/uL — ABNORMAL LOW (ref 4.22–5.81)
RDW: 17.5 % — ABNORMAL HIGH (ref 11.5–15.5)
RDW: 17.6 % — ABNORMAL HIGH (ref 11.5–15.5)
WBC: 14.9 10*3/uL — ABNORMAL HIGH (ref 4.0–10.5)
WBC: 16.8 10*3/uL — ABNORMAL HIGH (ref 4.0–10.5)
nRBC: 0 % (ref 0.0–0.2)
nRBC: 0 % (ref 0.0–0.2)

## 2018-08-24 LAB — PROTIME-INR
INR: 2.4 — ABNORMAL HIGH (ref 0.8–1.2)
Prothrombin Time: 26 seconds — ABNORMAL HIGH (ref 11.4–15.2)

## 2018-08-24 NOTE — Progress Notes (Signed)
Patient ID: [name] [name], male   DOB: 09-25-1965, 53 y.o.   MRN: [id number]    Subjective: Recurrent left flank pain and abdominal pain last night with recurrent blood per urethra. Pain responds to pain medications and is improved this AM. Heparin drip turned off last night in setting of recurrent blood per urethra. INR 2.4 this AM despite not having receiving warfarin this admission. He has not yet ambulated due to recurrent pain / blood per urethra. Hgb 7.1.   Objective: Vital signs in last 24 hours: Temp:  [98.1 F (36.7 C)-98.8 F (37.1 C)] 98.4 F (36.9 C) (05/08 0432) Pulse Rate:  [89-101] 99 (05/08 0432) Resp:  [17-24] 17 (05/08 0432) BP: (86-107)/(51-66) 103/66 (05/08 0432) SpO2:  [95 %-99 %] 99 % (05/08 0432) Weight:  [130.6 kg-132.4 kg] 130.6 kg (05/07 2032)  Intake/Output from previous day: 05/07 0701 - 05/08 0700 In: 220 [P.O.:220] Out: 886  Intake/Output this shift: No intake/output data recorded.  Physical Exam:  General: Alert and oriented Abd: Moderate left CVAT and left abdominal tenderness  Lab Results: Recent Labs    08/23/18 0435 08/23/18 1847 08/24/18 0302  HGB 7.5* 7.3* 7.1*  HCT 24.2* 22.6* 23.4*   BMET Recent Labs    08/23/18 0800 08/24/18 0302  NA 136 136  K 4.5 4.1  CL 97* 100  CO2 26 24  GLUCOSE 78 94  BUN 56* 29*  CREATININE 8.93* 5.25*  CALCIUM 8.4* 8.5*     Studies/Results: No results found.  Assessment/Plan: 1) Left perinephric hematoma: Given increased pain and blood per urethra, concern for possible recurrent bleed. Blood per urethra could possibly indicate breakdown of clot in bladder. Would continue bedrest and continue serial Hgb and INR measurements to confirm stability today. Will need close monitoring of INR after discharge. Will also need repeat imaging of the left kidney in about 3 months to assess for any possible underlying renal mass of concern. Will continue to follow.   LOS: 5 days   Dorothey Baseman 08/24/2018,  7:31 AM

## 2018-08-24 NOTE — Progress Notes (Signed)
[facility]    NEPHROLOGY PROGRESS NOTE  SUBJECTIVE: Continues with abdominal pain, slightly better than yesterday.  Patient seen earlier this morning.  Denies chest pain, shortness of breath, nausea, vomiting, diarrhea or dysuria.  Continues to have some bloody urine.  All other review of systems are negative.   OBJECTIVE:  Vitals:   08/24/18 0432 08/24/18 0838  BP: 103/66 96/62  Pulse: 99 96  Resp: 17 20  Temp: 98.4 F (36.9 C)   SpO2: 99% 94%    Intake/Output Summary (Last 24 hours) at 08/24/2018 1627 Last data filed at 08/24/2018 6789 Gross per 24 hour  Intake 60 ml  Output 0 ml  Net 60 ml      General:  AAOx3 NAD HEENT: MMM [place] AT anicteric sclera Neck:  No JVD, no adenopathy CV:  Heart RRR  Lungs:  L/S CTA bilaterally Abd:  abd SNT/ND with normal BS GU:  Bladder non-palpable Extremities:  No LE edema. Skin:  No skin rash  MEDICATIONS:  . acetaminophen  650 mg Oral Q6H  . amiodarone  200 mg Oral Daily  . atorvastatin  80 mg Oral q1800  . darbepoetin (ARANESP) injection - DIALYSIS  100 mcg Intravenous Q Tue-HD  . feeding supplement (NEPRO CARB STEADY)  237 mL Oral TID BM  . feeding supplement (PRO-STAT SUGAR FREE 64)  30 mL Oral BID  . lanthanum  1,000 mg Oral TID PC  . midodrine  15 mg Oral TID WC  . multivitamin  1 tablet Oral QHS       LABS:   CBC Latest Ref Rng & Units 08/24/2018 08/23/2018 08/23/2018  WBC 4.0 - 10.5 K/uL 14.9(H) 15.9(H) 15.6(H)  Hemoglobin 13.0 - 17.0 g/dL 7.1(L) 7.3(L) 7.5(L)  Hematocrit 39.0 - 52.0 % 23.4(L) 22.6(L) 24.2(L)  Platelets 150 - 400 K/uL 327 365 369    CMP Latest Ref Rng & Units 08/24/2018 08/23/2018 08/22/2018  Glucose 70 - 99 mg/dL 94 78 80  BUN 6 - 20 mg/dL 29(H) 56(H) 44(H)  Creatinine 0.61 - 1.24 mg/dL 5.25(H) 8.93(H) 7.77(H)  Sodium 135 - 145 mmol/L 136 136 136  Potassium 3.5 - 5.1 mmol/L 4.1 4.5 4.3  Chloride 98 - 111 mmol/L 100 97(L) 94(L)  CO2 22 - 32 mmol/L 24 26 24   Calcium 8.9 - 10.3 mg/dL 8.5(L)  8.4(L) 8.6(L)  Total Protein 6.5 - 8.1 g/dL - - -  Total Bilirubin 0.3 - 1.2 mg/dL - - -  Alkaline Phos 38 - 126 U/L - - -  AST 15 - 41 U/L - - -  ALT 0 - 44 U/L - - -    Lab Results  Component Value Date   CALCIUM 8.5 (L) 08/24/2018   CAION 1.20 11/28/2017   PHOS 5.0 (H) 08/24/2018    No results found for: COLORURINE, APPEARANCEUR, LABSPEC, PHURINE, GLUCOSEU, HGBUR, BILIRUBINUR, KETONESUR, PROTEINUR, UROBILINOGEN, NITRITE, LEUKOCYTESUR    Component Value Date/Time   PHART 7.409 09/03/2017 1110   PCO2ART 36.4 09/03/2017 1110   PO2ART 67.0 (L) 09/03/2017 1110   HCO3 23.5 09/03/2017 1110   TCO2 29 11/28/2017 0743   ACIDBASEDEF 1.0 09/03/2017 1110   O2SAT 43.2 09/04/2017 0848       Component Value Date/Time   IRON 26 (L) 08/22/2018 0625   TIBC 126 (L) 08/22/2018 0625   FERRITIN 4,516 (H) 08/22/2018 0625   IRONPCTSAT 21 08/22/2018 3810       ASSESSMENT/PLAN:    53 year old male patient with end-stage renal disease due to polycystic kidney  disease who presents with a left perinephric hematoma.  His hemoglobin was 6.4 on admission.  He is status post 4 units of packed red blood cells.  1.  Recurrent left perinephric hematoma.  Status post 4 units of PRBCs.  Evaluated by IR and urology, recommending conservative management.  If patient decompensates, would recommend angiogram/embolization with IR.  2.  End-stage renal disease on hemodialysis.  Continue Tuesday, Thursday, Saturday dialysis schedule.  No heparin due to bleed.  3.  Chronic hypotension.  Blood pressures remain on the low side.  Continue midodrine.  4.  AVR.  On Coumadin.  5.  Anemia.  Secondary to end-stage renal disease and acute blood loss.  He is now status post 4 units of packed RBCs.  Continue Aranesp with dialysis.  Ferritin is stable.  6.  Secondary hyperparathyroidism.  Hectorol on hold.  On Fosrenol for phosphorus of 6.0.  7.  Nutrition.  Continue protein supplementation.     [place], DO,  [place]

## 2018-08-24 NOTE — Progress Notes (Signed)
   Subjective: No overnight events. Mr. Cush reports when the towel around his penis was changed this morning, there was dry blood on the towel. Unsure whether he's still bleeding now. Pain is controlled this morning after getting a dose of PO dilaudid. Counseled patient about the importance of good nutrition and PO intake. All questions addressed.  Objective:  Vital signs in last 24 hours: Vitals:   08/23/18 1339 08/23/18 1700 08/23/18 2032 08/24/18 0432  BP: (!) 102/59 (!) 86/66 100/65 103/66  Pulse: (!) 101 89 99 99  Resp:  18 18 17   Temp:   98.8 F (37.1 C) 98.4 F (36.9 C)  TempSrc:   Oral Oral  SpO2: 95% 98% 97% 99%  Weight:   130.6 kg   Height:       Gen: alert and oriented, no distress CV: RRR, mechanical valve click Pulm: CTA in the anterior fields, normal effort on room air Abd: bs+, soft, non-distended, non-tender GU: there is blood slowly oozing from his urethra Ext: no edema  Assessment/Plan:  Principal Problem:   Perinephric hematoma Active Problems:   ESRD (end stage renal disease) (HCC)   Morbid obesity (HCC)   Systolic CHF (HCC)   S/P aortic valve replacement with bileaflet mechanical valve   Acute blood loss anemia   Gross hematuria   Supratherapeutic INR  Mr. Heffner is a 53 yo man with CAD s/p CABG, CHF, ESRD on HD, polycystic kidney disease, and aortic valve replacement on Coumadin who presented withabdominalpain and bleeding from the penissecondary to an expanding left-sided perinephric hematoma from PCKD and supratherapeutic INR.Outside hospital CT demonstrated persistent and mildly increased hematoma,likely expanded from supratherapeutic INR.  L perinephric hematoma:  AVR on Coumadin Has requiredfourblood transfusions since admission. Hb 7.1 this am. Had a brief episode of urethral bleeding yesterday and continues to have oozing today. INR 2.4 despite holding warfarin for 6 days. Heparin was discontinued yesterday due to rising INR. Question  whether amiodarone may be contributing to elevated coumadin levels. Both urology and IR agree with conservative management at this time because patient is hemodynamically stable. However, if he continues to require blood transfusions with therapeutic INR or if his INRs cannot be regulated within goal parameters, he may benefit from embolization with IR. - Urology following, appreciate recs - IRfollowing, appreciate recs - Continue INR monitoring with goal 2-2.5, dose warfarin per pharmacy -Pain control: PO dilaudid1mg  q6hrs prn with IV fentanyl q6hrs for breakthrough - Trend CBC  ESRD on HD TTS -HD per nephrology - Continue home fosrenol - Continue ESA per nephro for iron deficiency anemia in the setting of acute blood loss and CKD - Trend BMP   Hypotension:Chronic hypotension with MAPs 60-65. Asymptomatic.Blood pressure 103/66 this am. Will fluid bolus and recheck Hb if pressures worsenor patient becomes symptomatic. - Continue home midodrine 15mg  TID -Discontinued homemetoprolol  CAD s/p CABG Systolic CHF Echo on 5/69 showed an EF of 79%YIA grade 1 diastolic dysfunction. No acute exacerbation. - Continue home amiodarone 200mg  daily - Volume management with HD  Leukocytosis: White count 14 today.No obvious signs of infection. May be caused bystress reaction orresorption of known hematoma.  - Trend CBC  Dispo: Anticipated discharge when patient's Hb is stable with therapeutic INR  Keyston Ardolino, [name], MD 08/24/2018, 6:40 AM Pager: [phone]

## 2018-08-24 NOTE — Progress Notes (Signed)
Pt refused breakfast, said he has no appetite, encouraged pt to try small feedings. Will continue to monitor.

## 2018-08-24 NOTE — Progress Notes (Signed)
  Date: 08/24/2018  Patient name: [name] [name]  Medical record number: [id number]  Date of birth: February 11, 1966   I have seen and evaluated this patient and I have discussed the plan of care with the house staff. Please see their note for complete details. I concur with their findings with the following additions/corrections:   Had more urethral bleeding yesterday, and has slow oozing from the urethra this morning.  Heparin drip was stopped yesterday afternoon.  Hgb has drifted down to 7.1.  INR is 2.4 this morning despite no warfarin since admission.  He continues to have some pain, but it is a little more manageable this morning.  Difficult situation, appreciate urology and IR continuing to follow.  Urethral bleeding does not necessarily reflect ongoing bleeding into his perinephric hematoma and could simply be the result of expulsion of blood and clot from his bladder.  However, concerned that his Hgb has started to drift down again despite being off of anticoagulation completely since yesterday.  If he cannot tolerate an INR 2-2.5 without bleeding from his kidney, he may require embolization.   Lenice Pressman, M.D., Ph.D. 08/24/2018, 12:22 PM

## 2018-08-24 NOTE — Progress Notes (Signed)
ANTICOAGULATION CONSULT NOTE - Initial Consult  Pharmacy Consult for heparin/warfarin Indication: Mechanical valve  No Known Allergies  Patient Measurements: Height: 6\' 1"  (185.4 cm) Weight: 287 lb 14.7 oz (130.6 kg) IBW/kg (Calculated) : 79.9  Vital Signs: Temp: 98.4 F (36.9 C) (05/08 0432) Temp Source: Oral (05/08 0432) BP: 96/62 (05/08 0838) Pulse Rate: 96 (05/08 0838)  Labs: Recent Labs    08/22/18 1218 08/22/18 1445  08/22/18 2116 08/23/18 0435 08/23/18 0800 08/23/18 0831 08/23/18 1328 08/23/18 1847 08/24/18 0302  HGB  --   --    < >  --  7.5*  --   --   --  7.3* 7.1*  HCT  --   --    < >  --  24.2*  --   --   --  22.6* 23.4*  PLT  --   --    < >  --  369  --   --   --  365 327  LABPROT  --   --   --   --  22.3*  --   --  26.7*  --  26.0*  INR  --   --   --   --  2.0*  --   --  2.5*  --  2.4*  HEPARINUNFRC  --  0.30  --  0.36  --   --  0.43  --   --   --   CREATININE 7.77*  --   --   --   --  8.93*  --   --   --  5.25*   < > = values in this interval not displayed.    Estimated Creatinine Clearance: 23.1 mL/min (A) (by C-G formula based on SCr of 5.25 mg/dL (H)).   Medical History: Past Medical History:  Diagnosis Date  . Aneurysm ([place])     Right arm fistula 3 aneurysms 2011,   plans to have a new procedure  . Angina   . Chest discomfort   . CHF (congestive heart failure) ([place])   . Coronary artery disease   . Coronary artery disease involving native coronary artery of native heart without angina pectoris   . Dialysis patient ([place])   . Dysrhythmia   . ESRD (end stage renal disease) ([place])    on hemodialysis T_T_S  . Hypertension   . Leg pain   . Mitral regurgitation   . Morbid obesity ([place])   . Orthostatic hypotension   . Overweight(278.02)   . Renal insufficiency   . 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 Severe  aortic insufficiency   . Sinus tachycardia   . Syncope     positional after dialysis... 2008   Assessment: 54 YOM on warfarin PTA for mech aortic valve with subtherapeutic INR started on heparin with no bolus and tight goals d/t bleed risk with current hematuria.   Heparin level this morning is therapeutic at 0.43.   Last dose warfarin was on 5/2 and patient is s/p 5mg  Vitamin K. INR today however is 2.4 despite that. Goal has been decreased to 2-2.5.   Hgb down to 7.1 and still having blood from his urethra (may represent old blood from clots in bladder per urology)   Goal of Therapy:  Heparin level 0.3-0.5 units/ml Monitor platelets by anticoagulation protocol: Yes   Plan:  Holding  heparin Holding warfarin Daily INR and CBC  [name] [name], PharmD, [place] Please utilize Amion for appropriate phone number to reach the unit pharmacist ([facility])     08/24/2018 1:53 PM

## 2018-08-25 DIAGNOSIS — Z951 Presence of aortocoronary bypass graft: Secondary | ICD-10-CM

## 2018-08-25 DIAGNOSIS — Z992 Dependence on renal dialysis: Secondary | ICD-10-CM

## 2018-08-25 DIAGNOSIS — Z7901 Long term (current) use of anticoagulants: Secondary | ICD-10-CM

## 2018-08-25 DIAGNOSIS — Z79899 Other long term (current) drug therapy: Secondary | ICD-10-CM

## 2018-08-25 DIAGNOSIS — N186 End stage renal disease: Secondary | ICD-10-CM

## 2018-08-25 DIAGNOSIS — D72829 Elevated white blood cell count, unspecified: Secondary | ICD-10-CM

## 2018-08-25 DIAGNOSIS — R791 Abnormal coagulation profile: Secondary | ICD-10-CM

## 2018-08-25 DIAGNOSIS — D631 Anemia in chronic kidney disease: Secondary | ICD-10-CM

## 2018-08-25 DIAGNOSIS — I132 Hypertensive heart and chronic kidney disease with heart failure and with stage 5 chronic kidney disease, or end stage renal disease: Secondary | ICD-10-CM

## 2018-08-25 DIAGNOSIS — Q613 Polycystic kidney, unspecified: Secondary | ICD-10-CM

## 2018-08-25 DIAGNOSIS — Z9889 Other specified postprocedural states: Secondary | ICD-10-CM

## 2018-08-25 DIAGNOSIS — S37012A Minor contusion of left kidney, initial encounter: Principal | ICD-10-CM

## 2018-08-25 DIAGNOSIS — D62 Acute posthemorrhagic anemia: Secondary | ICD-10-CM

## 2018-08-25 DIAGNOSIS — I2510 Atherosclerotic heart disease of native coronary artery without angina pectoris: Secondary | ICD-10-CM

## 2018-08-25 DIAGNOSIS — I5020 Unspecified systolic (congestive) heart failure: Secondary | ICD-10-CM

## 2018-08-25 DIAGNOSIS — Z952 Presence of prosthetic heart valve: Secondary | ICD-10-CM

## 2018-08-25 DIAGNOSIS — X58XXXA Exposure to other specified factors, initial encounter: Secondary | ICD-10-CM

## 2018-08-25 DIAGNOSIS — I959 Hypotension, unspecified: Secondary | ICD-10-CM

## 2018-08-25 LAB — CBC
HCT: 22 % — ABNORMAL LOW (ref 39.0–52.0)
Hemoglobin: 6.8 g/dL — CL (ref 13.0–17.0)
MCH: 26.3 pg (ref 26.0–34.0)
MCHC: 30.9 g/dL (ref 30.0–36.0)
MCV: 84.9 fL (ref 80.0–100.0)
Platelets: 333 10*3/uL (ref 150–400)
RBC: 2.59 MIL/uL — ABNORMAL LOW (ref 4.22–5.81)
RDW: 17.6 % — ABNORMAL HIGH (ref 11.5–15.5)
WBC: 15.1 10*3/uL — ABNORMAL HIGH (ref 4.0–10.5)
nRBC: 0 % (ref 0.0–0.2)

## 2018-08-25 LAB — RENAL FUNCTION PANEL
Albumin: 1.6 g/dL — ABNORMAL LOW (ref 3.5–5.0)
Albumin: 1.5 g/dL — ABNORMAL LOW (ref 3.5–5.0)
Anion gap: 12 (ref 5–15)
Anion gap: 15 (ref 5–15)
BUN: 29 mg/dL — ABNORMAL HIGH (ref 6–20)
BUN: 53 mg/dL — ABNORMAL HIGH (ref 6–20)
CO2: 24 mmol/L (ref 22–32)
CO2: 24 mmol/L (ref 22–32)
Calcium: 8.5 mg/dL — ABNORMAL LOW (ref 8.9–10.3)
Calcium: 8.2 mg/dL — ABNORMAL LOW (ref 8.9–10.3)
Chloride: 100 mmol/L (ref 98–111)
Chloride: 96 mmol/L — ABNORMAL LOW (ref 98–111)
Creatinine, Ser: 5.25 mg/dL — ABNORMAL HIGH (ref 0.61–1.24)
Creatinine, Ser: 7.48 mg/dL — ABNORMAL HIGH (ref 0.61–1.24)
GFR calc Af Amer: 13 mL/min — ABNORMAL LOW (ref >60)
GFR calc Af Amer: 9 mL/min — ABNORMAL LOW (ref >60)
GFR calc non Af Amer: 12 mL/min — ABNORMAL LOW (ref >60)
GFR calc non Af Amer: 8 mL/min — ABNORMAL LOW (ref >60)
Glucose, Bld: 94 mg/dL (ref 70–99)
Glucose, Bld: 85 mg/dL (ref 70–99)
Phosphorus: 5 mg/dL — ABNORMAL HIGH (ref 2.5–4.6)
Phosphorus: 5.7 mg/dL — ABNORMAL HIGH (ref 2.5–4.6)
Potassium: 4.1 mmol/L (ref 3.5–5.1)
Potassium: 4.1 mmol/L (ref 3.5–5.1)
Sodium: 136 mmol/L (ref 135–145)
Sodium: 135 mmol/L (ref 135–145)

## 2018-08-25 LAB — PREPARE RBC (CROSSMATCH)

## 2018-08-25 LAB — HEMOGLOBIN AND HEMATOCRIT, BLOOD
HCT: 21.9 % — ABNORMAL LOW (ref 39.0–52.0)
Hemoglobin: 7 g/dL — ABNORMAL LOW (ref 13.0–17.0)

## 2018-08-25 LAB — PROTIME-INR
INR: 2.8 — ABNORMAL HIGH (ref 0.8–1.2)
INR: 3 — ABNORMAL HIGH (ref 0.8–1.2)
Prothrombin Time: 29.1 seconds — ABNORMAL HIGH (ref 11.4–15.2)
Prothrombin Time: 30.7 seconds — ABNORMAL HIGH (ref 11.4–15.2)

## 2018-08-25 MED ORDER — VITAMIN K1 10 MG/ML IJ SOLN
0.5000 mg | Freq: Once | INTRAVENOUS | Status: AC
  Administered 2018-08-25: 0.5 mg via INTRAVENOUS
  Filled 2018-08-25: qty 0.05

## 2018-08-25 MED ORDER — MIDODRINE HCL 5 MG PO TABS
ORAL_TABLET | ORAL | Status: AC
  Administered 2018-08-25: 15 mg via ORAL
  Filled 2018-08-25: qty 3

## 2018-08-25 MED ORDER — SODIUM CHLORIDE 0.9% IV SOLUTION
Freq: Once | INTRAVENOUS | Status: AC
  Administered 2018-08-26: 10:00:00 via INTRAVENOUS

## 2018-08-25 NOTE — Progress Notes (Signed)
Subjective: [name] [name] is in dialysis today.  He has some left flank pain.  His Hgb has drifted down slowly over the last 2 days from 7.5 to 6.8 and he is getting a unit in dialysis today.  His INR is up slightly at 2.8.    ROS:  Review of Systems  Gastrointestinal: Positive for abdominal pain.    Anti-infectives: Anti-infectives (From admission, onward)   None      Current Facility-Administered Medications  Medication Dose Route Frequency Provider Last Rate Last Dose  . 0.9 %  sodium chloride infusion (Manually program via Guardrails IV Fluids)   Intravenous Once [name], MD      . acetaminophen (TYLENOL) tablet 650 mg  650 mg Oral Q6H PRN [name], MD   650 mg at 08/21/18 1412   Or  . acetaminophen (TYLENOL) suppository 650 mg  650 mg Rectal Q6H PRN [name], MD      . acetaminophen (TYLENOL) tablet 650 mg  650 mg Oral Q6H Dorrell, [name], MD   650 mg at 08/25/18 0608  . amiodarone (PACERONE) tablet 200 mg  200 mg Oral Daily [name], MD   200 mg at 08/24/18 1005  . atorvastatin (LIPITOR) tablet 80 mg  80 mg Oral q1800 [name], MD   80 mg at 08/24/18 1657  . Darbepoetin Alfa (ARANESP) injection 100 mcg  100 mcg Intravenous Q Tue-HD Ernest Haber, PA-C   100 mcg at 08/21/18 1124  . feeding supplement (NEPRO CARB STEADY) liquid 237 mL  237 mL Oral TID BM Oda Kilts, MD   237 mL at 08/23/18 2024  . feeding supplement (PRO-STAT SUGAR FREE 64) liquid 30 mL  30 mL Oral BID Valentina Gu, NP   30 mL at 08/24/18 2209  . fentaNYL (SUBLIMAZE) injection 25-50 mcg  25-50 mcg Intravenous Q6H PRN Dorrell, [name], MD      . HYDROmorphone (DILAUDID) tablet 1 mg  1 mg Oral Q6H PRN [name], MD   1 mg at 08/25/18 0609  . lanthanum (FOSRENOL) chewable tablet 1,000 mg  1,000 mg Oral TID PC [name], MD   1,000 mg at 08/22/18 6226  . lanthanum (FOSRENOL) chewable tablet 500 mg  500 mg Oral PRN Oda Kilts,  MD      . midodrine (PROAMATINE) tablet 15 mg  15 mg Oral TID WC [name], MD   15 mg at 08/25/18 0733  . multivitamin (RENA-VIT) tablet 1 tablet  1 tablet Oral QHS Valentina Gu, NP   1 tablet at 08/24/18 2209  . ondansetron (ZOFRAN) tablet 4 mg  4 mg Oral Q6H PRN [name], MD       Or  . ondansetron [facility]) injection 4 mg  4 mg Intravenous Q6H PRN [name], MD   4 mg at 08/20/18 2035     Objective: Vital signs in last 24 hours: Temp:  [98 F (36.7 C)-98.2 F (36.8 C)] 98 F (36.7 C) (05/09 1015) Pulse Rate:  [77-112] 103 (05/09 1015) Resp:  [18-25] 25 (05/09 1015) BP: (70-111)/(37-70) 86/56 (05/09 1015) SpO2:  [97 %-98 %] 98 % (05/09 3335) Weight:  [130.6 kg-131.3 kg] 131.3 kg (05/09 4562)  Intake/Output from previous day: 05/08 0701 - 05/09 0700 In: 60 [P.O.:60] Out: 0  Intake/Output this shift: No intake/output data recorded.   Physical Exam Vitals signs reviewed.  Constitutional:      Appearance: He is well-developed.  Neurological:     Mental Status: He is alert.     Lab Results:  Recent Labs    08/24/18 1815 08/25/18 0422  WBC 16.8* 15.1*  HGB 7.0* 6.8*  HCT 22.2* 22.0*  PLT 350 333   BMET Recent Labs    08/24/18 0302 08/25/18 0701  NA 136 135  K 4.1 4.1  CL 100 96*  CO2 24 24  GLUCOSE 94 85  BUN 29* 53*  CREATININE 5.25* 7.48*  CALCIUM 8.5* 8.2*   PT/INR Recent Labs    08/24/18 0302 08/25/18 0422  LABPROT 26.0* 29.1*  INR 2.4* 2.8*   ABG No results for input(s): PHART, HCO3 in the last 72 hours.  Invalid input(s): PCO2, PO2  Studies/Results: No results found.   Assessment and Plan: Left renal hematoma.   Hgb has drifted down slowly and he is getting a unit today.  Continue monitoring and consider repeat CT next week if decline continues.        LOS: 6 days    [name] 08/25/2018 [phone] ID: Oletta Darter, male   DOB: [birth year], 53 y.o.   MRN: [id number]

## 2018-08-25 NOTE — Progress Notes (Signed)
[facility]    NEPHROLOGY PROGRESS NOTE  SUBJECTIVE: Continues with abdominal pain.  Hgb drop over several days, requiring PRBCs on HD.  Patient seen earlier this morning.  Denies chest pain, shortness of breath, nausea, vomiting, diarrhea or dysuria.  Continues to have some bloody urine.  All other review of systems are negative.   OBJECTIVE:  Vitals:   08/25/18 1015 08/25/18 1030  BP: (!) 86/56   Pulse: (!) 103 (!) 102  Resp: (!) 25   Temp: 98 F (36.7 C)   SpO2:      Intake/Output Summary (Last 24 hours) at 08/25/2018 1128 Last data filed at 08/25/2018 0448 Gross per 24 hour  Intake 0 ml  Output 0 ml  Net 0 ml      General:  AAOx3 NAD HEENT: MMM [place] AT anicteric sclera Neck:  No JVD, no adenopathy CV:  Heart RRR  Lungs:  L/S CTA bilaterally Abd:  LUQ and LLQ tenderness GU:  Bladder non-palpable Extremities:  No LE edema. Skin:  No skin rash  MEDICATIONS:  . sodium chloride   Intravenous Once  . acetaminophen  650 mg Oral Q6H  . amiodarone  200 mg Oral Daily  . atorvastatin  80 mg Oral q1800  . darbepoetin (ARANESP) injection - DIALYSIS  100 mcg Intravenous Q Tue-HD  . feeding supplement (NEPRO CARB STEADY)  237 mL Oral TID BM  . feeding supplement (PRO-STAT SUGAR FREE 64)  30 mL Oral BID  . lanthanum  1,000 mg Oral TID PC  . midodrine  15 mg Oral TID WC  . multivitamin  1 tablet Oral QHS       LABS:   CBC Latest Ref Rng & Units 08/25/2018 08/24/2018 08/24/2018  WBC 4.0 - 10.5 K/uL 15.1(H) 16.8(H) 14.9(H)  Hemoglobin 13.0 - 17.0 g/dL 6.8(LL) 7.0(L) 7.1(L)  Hematocrit 39.0 - 52.0 % 22.0(L) 22.2(L) 23.4(L)  Platelets 150 - 400 K/uL 333 350 327    CMP Latest Ref Rng & Units 08/25/2018 08/24/2018 08/23/2018  Glucose 70 - 99 mg/dL 85 94 78  BUN 6 - 20 mg/dL 53(H) 29(H) 56(H)  Creatinine 0.61 - 1.24 mg/dL 7.48(H) 5.25(H) 8.93(H)  Sodium 135 - 145 mmol/L 135 136 136  Potassium 3.5 - 5.1 mmol/L 4.1 4.1 4.5  Chloride 98 - 111 mmol/L 96(L) 100 97(L)  CO2 22  - 32 mmol/L 24 24 26   Calcium 8.9 - 10.3 mg/dL 8.2(L) 8.5(L) 8.4(L)  Total Protein 6.5 - 8.1 g/dL - - -  Total Bilirubin 0.3 - 1.2 mg/dL - - -  Alkaline Phos 38 - 126 U/L - - -  AST 15 - 41 U/L - - -  ALT 0 - 44 U/L - - -    Lab Results  Component Value Date   CALCIUM 8.2 (L) 08/25/2018   CAION 1.20 11/28/2017   PHOS 5.7 (H) 08/25/2018    No results found for: COLORURINE, APPEARANCEUR, LABSPEC, PHURINE, GLUCOSEU, HGBUR, BILIRUBINUR, KETONESUR, PROTEINUR, UROBILINOGEN, NITRITE, LEUKOCYTESUR    Component Value Date/Time   PHART 7.409 09/03/2017 1110   PCO2ART 36.4 09/03/2017 1110   PO2ART 67.0 (L) 09/03/2017 1110   HCO3 23.5 09/03/2017 1110   TCO2 29 11/28/2017 0743   ACIDBASEDEF 1.0 09/03/2017 1110   O2SAT 43.2 09/04/2017 0848       Component Value Date/Time   IRON 26 (L) 08/22/2018 0625   TIBC 126 (L) 08/22/2018 0625   FERRITIN 4,516 (H) 08/22/2018 0625   IRONPCTSAT 21 08/22/2018 0277  ASSESSMENT/PLAN:    53 year old male patient with end-stage renal disease due to polycystic kidney disease who presents with a left perinephric hematoma.  His hemoglobin was 6.4 on admission.  He is status post 4 units of packed red blood cells.  1.  Recurrent left perinephric hematoma.  Status post 4 units of PRBCs.  Evaluated by IR and urology, recommending conservative management.  If patient decompensates, would recommend angiogram/embolization with IR.  Urology is following.  Would consider repeat imaging  2.  End-stage renal disease on hemodialysis.  Continue Tuesday, Thursday, Saturday dialysis schedule.  No heparin due to bleed.  3.  Chronic hypotension.  Blood pressures remain on the low side.  Continue midodrine.  4.  AVR.  On Coumadin.  5.  Anemia.  Secondary to end-stage renal disease and acute blood loss.  He is now status post 4 units of packed RBCs.  Continue Aranesp with dialysis.  Ferritin is stable.  6.  Secondary hyperparathyroidism.  Hectorol on hold.  On Fosrenol  for phosphorus of 6.0.  7.  Nutrition.  Continue protein supplementation.     [place], DO, [place]

## 2018-08-25 NOTE — Progress Notes (Signed)
Internal Medicine Attending:   I saw and examined the patient. I reviewe Dr Maudie Flakes note and I agree with the resident's findings and plan as documented in the resident's note. INR still drifting a bit higher despite holding Coumadin.  We are monitoring closely.  Hemoglobin continues to drift down slowly but no acute issues.  Urology as well as nephrology continue to follow.

## 2018-08-25 NOTE — Progress Notes (Addendum)
[name] [name] for heparin/warfarin Indication: Mechanical valve  No Known Allergies  Patient Measurements: Height: 6\' 1"  (185.4 cm) Weight: 289 lb 7.4 oz (131.3 kg) IBW/kg (Calculated) : 79.9  Vital Signs: Temp: 98.1 F (36.7 C) (05/09 0632) Temp Source: Oral (05/09 0632) BP: 80/40 (05/09 0645) Pulse Rate: 100 (05/09 0645)  Labs: Recent Labs    08/22/18 1218 08/22/18 1445  08/22/18 2116  08/23/18 0800 08/23/18 0831 08/23/18 1328  08/24/18 0302 08/24/18 1815 08/25/18 0422  HGB  --   --    < >  --    < >  --   --   --    < > 7.1* 7.0* 6.8*  HCT  --   --    < >  --    < >  --   --   --    < > 23.4* 22.2* 22.0*  PLT  --   --    < >  --    < >  --   --   --    < > 327 350 333  LABPROT  --   --   --   --    < >  --   --  26.7*  --  26.0*  --  29.1*  INR  --   --   --   --    < >  --   --  2.5*  --  2.4*  --  2.8*  HEPARINUNFRC  --  0.30  --  0.36  --   --  0.43  --   --   --   --   --   CREATININE 7.77*  --   --   --   --  8.93*  --   --   --  5.25*  --   --    < > = values in this interval not displayed.    Estimated Creatinine Clearance: 23.1 mL/min (A) (by C-G formula based on SCr of 5.25 mg/dL (H)).   Medical History: Past Medical History:  Diagnosis Date  . Aneurysm ([place])     Right arm fistula 3 aneurysms 2011,   plans to have a new procedure  . Angina   . Chest discomfort   . CHF (congestive heart failure) ([place])   . Coronary artery disease   . Coronary artery disease involving native coronary artery of native heart without angina pectoris   . Dialysis patient ([place])   . Dysrhythmia   . ESRD (end stage renal disease) ([place])    on hemodialysis T_T_S  . Hypertension   . Leg pain   . Mitral regurgitation   . Morbid obesity ([place])   . Orthostatic hypotension   . Overweight(278.02)   . Renal insufficiency   . S/P aortic valve replacement with bileaflet mechanical valve 09/01/2017   25 mm Sorin [employer] [place] bileaflet  mechanical valve  . 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 Severe aortic insufficiency   . Sinus tachycardia   . Syncope     positional after dialysis... 2008   Assessment: [name] on warfarin PTA for mech aortic valve with subtherapeutic INR started on heparin with no bolus and tight goals d/t bleed risk with current hematuria.   Last dose warfarin was on 5/2 and patient is s/p 5mg  Vitamin K. INR goal has been decreased to 2-2.5  5/9 Update: This morning Hgb slightly down from 7.1>>6.8, yesterday  reports of patient having urethral bleeding; uncertain if this reflects an ongoing bleed. If so, pt may require embolization. Today INR drifted up from 2.4>>2.8 without warfarin dosing; vitamin K was given 5 days ago.  Given he is supratherapeutic (INR goal 2-2.5) will continue to hold warfarin doses and will not restart heparin gtt either.   Goal of Therapy:  Heparin level 0.3-0.5 units/ml Monitor platelets by anticoagulation protocol: Yes   Plan:  Holding heparin Holding warfarin  Daily INR and CBC  Thank you for involving pharmacy in this patient's care.  Janae Bridgeman, PharmD PGY1 Pharmacy Resident Phone: [phone] 08/25/2018 7:23 AM

## 2018-08-25 NOTE — Progress Notes (Signed)
CRITICAL VALUE ALERT  Critical Value: Hemoglobin level of 6.8  Date & Time Notied: 08/25/2018 at 0509  Provider Notified: Silas [place], NP  Orders Received/Actions taken: Awaiting for orders.

## 2018-08-25 NOTE — Plan of Care (Signed)
  Problem: Pain Managment: Goal: General experience of comfort will improve Outcome: Progressing   Problem: Clinical Measurements: Goal: Complications related to the disease process, condition or treatment will be avoided or minimized Outcome: Progressing

## 2018-08-25 NOTE — Progress Notes (Signed)
   Subjective: No overnight events. [name] [name] reports when the towel around his penis was changed this morning, there was more blood on the towel much like yesterday.  Still not eating.  No dizziness or dyspnea.  Had some back pain last night that made it difficult to sleep.  Objective:  Vital signs in last 24 hours: Vitals:   08/25/18 0745 08/25/18 0754 08/25/18 0815 08/25/18 0830  BP: (!) 71/37 (!) 70/44 (!) 92/56 (!) 90/53  Pulse: (!) 112 (!) 108 (!) 103 77  Resp:      Temp:      TempSrc:      SpO2:      Weight:      Height:       Gen: alert and oriented, no distress CV: RRR, mechanical valve click Pulm: CTA in the anterior fields, normal effort on room air Abd: bs+, soft, non-distended, non-tender GU: did not examine as pt in dialysis Ext: no edema  Assessment/Plan:  Principal Problem:   Perinephric hematoma Active Problems:   ESRD (end stage renal disease) (HCC)   Morbid obesity (HCC)   Systolic CHF (HCC)   S/P aortic valve replacement with bileaflet mechanical valve   Acute blood loss anemia   Gross hematuria   Supratherapeutic INR  [name] [name] is a 53 yo man with CAD s/p CABG, CHF, ESRD on HD, polycystic kidney disease, and aortic valve replacement on Coumadin who presented withabdominalpain and bleeding from the penissecondary to an expanding left-sided perinephric hematoma from PCKD and supratherapeutic INR.Outside hospital CT demonstrated persistent and mildly increased hematoma,likely expanded from supratherapeutic INR.  L perinephric hematoma:  AVR on Coumadin Has requiredfive blood transfusions since admission.  May need to consider embolization as pt continues to have bleeding.  INR continues to elevate despite no coumadin and now off heparin.  Spoke with heme onc yesterday could be several possibilities there is a question of vit k deficiency 2/2 poor oral intake.    - Urology following, appreciate recs - IRfollowing, appreciate recs - Continue INR  monitoring with goal 2-2.5, dose warfarin per pharmacy, may require additional vit K dose today -Pain control: PO dilaudid 1mg  q6hrs prn with IV fentanyl q6hrs for breakthrough - Trend CBC  ESRD on HD TTS -HD per nephrology - Continue home fosrenol - Continue ESA per nephro for iron deficiency anemia in the setting of acute blood loss and CKD - Trend BMP   Hypotension:Chronic hypotension with MAPs 60-65. Asymptomatic.Blood pressure 103/66 this am. Will fluid bolus and recheck Hb if pressures worsenor patient becomes symptomatic. - Continue home midodrine 15mg  TID -Discontinued homemetoprolol  CAD s/p CABG Systolic CHF Echo on 1/61 showed an EF of 09%UEA grade 1 diastolic dysfunction. No acute exacerbation. - Continue home amiodarone 200mg  daily - Volume management with HD  Leukocytosis: Continues to have stable leukocytosis.No obvious signs of infection. May be caused bystress reaction orresorption of known hematoma.  - Trend CBC  Dispo: Anticipated discharge when patient's Hb is stable with therapeutic INR  [name] Roan, MD 08/25/2018, 9:00 AM

## 2018-08-26 ENCOUNTER — Inpatient Hospital Stay (HOSPITAL_COMMUNITY): Payer: Medicare Other

## 2018-08-26 LAB — HEMOGLOBIN AND HEMATOCRIT, BLOOD
HCT: 22.7 % — ABNORMAL LOW (ref 39.0–52.0)
Hemoglobin: 7.1 g/dL — ABNORMAL LOW (ref 13.0–17.0)

## 2018-08-26 LAB — CBC
HCT: 21.3 % — ABNORMAL LOW (ref 39.0–52.0)
HCT: 23.6 % — ABNORMAL LOW (ref 39.0–52.0)
Hemoglobin: 6.6 g/dL — CL (ref 13.0–17.0)
Hemoglobin: 7.5 g/dL — ABNORMAL LOW (ref 13.0–17.0)
MCH: 26.4 pg (ref 26.0–34.0)
MCH: 26.8 pg (ref 26.0–34.0)
MCHC: 31 g/dL (ref 30.0–36.0)
MCHC: 31.8 g/dL (ref 30.0–36.0)
MCV: 85.2 fL (ref 80.0–100.0)
MCV: 84.3 fL (ref 80.0–100.0)
Platelets: 303 10*3/uL (ref 150–400)
Platelets: 321 10*3/uL (ref 150–400)
RBC: 2.5 MIL/uL — ABNORMAL LOW (ref 4.22–5.81)
RBC: 2.8 MIL/uL — ABNORMAL LOW (ref 4.22–5.81)
RDW: 17.2 % — ABNORMAL HIGH (ref 11.5–15.5)
RDW: 16.9 % — ABNORMAL HIGH (ref 11.5–15.5)
WBC: 13.3 10*3/uL — ABNORMAL HIGH (ref 4.0–10.5)
WBC: 15.3 10*3/uL — ABNORMAL HIGH (ref 4.0–10.5)
nRBC: 0 % (ref 0.0–0.2)
nRBC: 0 % (ref 0.0–0.2)

## 2018-08-26 LAB — PROTIME-INR
INR: 2 — ABNORMAL HIGH (ref 0.8–1.2)
INR: 1.6 — ABNORMAL HIGH (ref 0.8–1.2)
Prothrombin Time: 22.6 seconds — ABNORMAL HIGH (ref 11.4–15.2)
Prothrombin Time: 19.2 seconds — ABNORMAL HIGH (ref 11.4–15.2)

## 2018-08-26 LAB — PREPARE RBC (CROSSMATCH)

## 2018-08-26 MED ORDER — ALUM & MAG HYDROXIDE-SIMETH 200-200-20 MG/5ML PO SUSP
30.0000 mL | Freq: Once | ORAL | Status: AC
  Administered 2018-08-26: 30 mL via ORAL
  Filled 2018-08-26: qty 30

## 2018-08-26 MED ORDER — SODIUM CHLORIDE 0.9% IV SOLUTION
Freq: Once | INTRAVENOUS | Status: AC
  Administered 2018-08-26: 10:00:00 via INTRAVENOUS

## 2018-08-26 NOTE — Progress Notes (Signed)
Subjective: CC: left flank pain.  Hx: [name] [name] has a left renal hematoma in a polycystic kidney with associated anemia.  He got a unit of blood with dialysis yesterday and the Hgb came up to 7 after that but it is back down to 6.6 today.   He continues to have moderate left flank and abdominal pain.  His INR was 3 yesterday but is back to 2 today.   He has no associated signs or symptoms.  ROS:  Review of Systems  Genitourinary: Positive for flank pain.  All other systems reviewed and are negative.   Anti-infectives: Anti-infectives (From admission, onward)   None      Current Facility-Administered Medications  Medication Dose Route Frequency Provider Last Rate Last Dose  . 0.9 %  sodium chloride infusion (Manually program via Guardrails IV Fluids)   Intravenous Once Guadlupe Spanish B, MD      . 0.9 %  sodium chloride infusion (Manually program via Guardrails IV Fluids)   Intravenous Once [name], MD      . acetaminophen (TYLENOL) tablet 650 mg  650 mg Oral Q6H PRN [name], MD   650 mg at 08/21/18 1412   Or  . acetaminophen (TYLENOL) suppository 650 mg  650 mg Rectal Q6H PRN [name], MD      . acetaminophen (TYLENOL) tablet 650 mg  650 mg Oral Q6H Dorrell, [name], MD   650 mg at 08/26/18 0550  . amiodarone (PACERONE) tablet 200 mg  200 mg Oral Daily [name], MD   200 mg at 08/25/18 1809  . atorvastatin (LIPITOR) tablet 80 mg  80 mg Oral q1800 [name], MD   80 mg at 08/25/18 1808  . Darbepoetin Alfa (ARANESP) injection 100 mcg  100 mcg Intravenous Q Tue-HD Ernest Haber, PA-C   100 mcg at 08/21/18 1124  . feeding supplement (NEPRO CARB STEADY) liquid 237 mL  237 mL Oral TID BM Oda Kilts, MD   237 mL at 08/23/18 2024  . feeding supplement (PRO-STAT SUGAR FREE 64) liquid 30 mL  30 mL Oral BID Valentina Gu, NP   30 mL at 08/25/18 2151  . fentaNYL (SUBLIMAZE) injection 25-50 mcg  25-50 mcg Intravenous Q6H PRN  Dorrell, [name], MD      . HYDROmorphone (DILAUDID) tablet 1 mg  1 mg Oral Q6H PRN [name], MD   1 mg at 08/25/18 2222  . lanthanum (FOSRENOL) chewable tablet 1,000 mg  1,000 mg Oral TID PC [name], MD   1,000 mg at 08/22/18 2542  . lanthanum (FOSRENOL) chewable tablet 500 mg  500 mg Oral PRN Oda Kilts, MD      . midodrine (PROAMATINE) tablet 15 mg  15 mg Oral TID WC [name], MD   15 mg at 08/26/18 0829  . multivitamin (RENA-VIT) tablet 1 tablet  1 tablet Oral QHS Valentina Gu, NP   1 tablet at 08/25/18 2151  . ondansetron (ZOFRAN) tablet 4 mg  4 mg Oral Q6H PRN [name], MD       Or  . ondansetron [facility]) injection 4 mg  4 mg Intravenous Q6H PRN [name], MD   4 mg at 08/20/18 2035     Objective: Vital signs in last 24 hours: Temp:  [98 F (36.7 C)-98.8 F (37.1 C)] 98 F (36.7 C) (05/10 0930) Pulse Rate:  [58-103] 100 (05/10 0930) Resp:  [18-25] 20 (05/10  0930) BP: (83-103)/(35-69) 98/69 (05/10 0930) SpO2:  [96 %-98 %] 97 % (05/10 0930) Weight:  [129.1 kg-132.3 kg] 129.1 kg (05/10 0300)  Intake/Output from previous day: 05/09 0701 - 05/10 0700 In: 505 [P.O.:190; Blood:315] Out: -331  Intake/Output this shift: No intake/output data recorded.   Physical Exam Vitals signs reviewed.  Constitutional:      Appearance: He is well-developed.  Abdominal:     General: Abdomen is flat.     Palpations: Abdomen is soft. There is mass (firm in the LUQ).     Tenderness: There is abdominal tenderness in the left upper quadrant.  Skin:    General: Skin is warm and dry.  Neurological:     General: No focal deficit present.     Mental Status: He is alert and oriented to person, place, and time.  Psychiatric:        Mood and Affect: Mood normal.        Behavior: Behavior normal.     Lab Results:  Recent Labs    08/25/18 0422 08/25/18 1705 08/26/18 0713  WBC 15.1*  --  13.3*  HGB 6.8* 7.0* 6.6*  HCT  22.0* 21.9* 21.3*  PLT 333  --  303   BMET Recent Labs    08/24/18 0302 08/25/18 0701  NA 136 135  K 4.1 4.1  CL 100 96*  CO2 24 24  GLUCOSE 94 85  BUN 29* 53*  CREATININE 5.25* 7.48*  CALCIUM 8.5* 8.2*   PT/INR Recent Labs    08/25/18 1705 08/26/18 0713  LABPROT 30.7* 22.6*  INR 3.0* 2.0*   ABG No results for input(s): PHART, HCO3 in the last 72 hours.  Invalid input(s): PCO2, PO2  Studies/Results: No results found.   Assessment and Plan: Left renal hematoma with progressive anemia.   Hgb is down to 6.6 from 7 after he got a unit of blood yesterday.  His INR has declined to 2.   He will probably need another CT either today or, if preferred, he could have another Hgb in the AM before deciding on the CT.        LOS: 7 days    [name] 08/26/2018 [phone] ID: [name] [name], male   DOB: Mar 21, 1966, 53 y.o.   MRN: [id number]

## 2018-08-26 NOTE — Progress Notes (Signed)
Internal Medicine Attending:   I saw and examined the patient. I reviewed Dr Maudie Flakes note and I agree with the resident's findings and plan as documented in the resident's note. Notably despite transfusions his hemoglobin continues to drift lower.  Patient overall little frustrated but still thankful for his care.  Plan will be to repeat imaging per urology's recommendation.  Continue to trend hemoglobin we are going to transfuse an additional 1 unit PRBC today and continue to transfuse when hemoglobin drops below 7.  Fortunately his INR is at 2 today after a very low dose of vitamin K.

## 2018-08-26 NOTE — Progress Notes (Signed)
[name] [name] for heparin/warfarin Indication: Mechanical valve  No Known Allergies  Patient Measurements: Height: 6\' 1"  (185.4 cm) Weight: 284 lb 9.6 oz (129.1 kg) IBW/kg (Calculated) : 79.9  Vital Signs: Temp: 98.6 F (37 C) (05/10 0523) Temp Source: Oral (05/10 0523) BP: 99/66 (05/10 0523) Pulse Rate: 98 (05/10 0523)  Labs: Recent Labs    08/23/18 0800 08/23/18 0831  08/24/18 0302 08/24/18 1815 08/25/18 0422 08/25/18 0701 08/25/18 1705  HGB  --   --    < > 7.1* 7.0* 6.8*  --  7.0*  HCT  --   --    < > 23.4* 22.2* 22.0*  --  21.9*  PLT  --   --    < > 327 350 333  --   --   LABPROT  --   --    < > 26.0*  --  29.1*  --  30.7*  INR  --   --    < > 2.4*  --  2.8*  --  3.0*  HEPARINUNFRC  --  0.43  --   --   --   --   --   --   CREATININE 8.93*  --   --  5.25*  --   --  7.48*  --    < > = values in this interval not displayed.    Estimated Creatinine Clearance: 16.1 mL/min (A) (by C-G formula based on SCr of 7.48 mg/dL (H)).   Medical History: Past Medical History:  Diagnosis Date  . Aneurysm ([place])     Right arm fistula 3 aneurysms 2011,   plans to have a new procedure  . Angina   . Chest discomfort   . CHF (congestive heart failure) ([place])   . Coronary artery disease   . Coronary artery disease involving native coronary artery of native heart without angina pectoris   . Dialysis patient ([place])   . Dysrhythmia   . ESRD (end stage renal disease) ([place])    on hemodialysis T_T_S  . Hypertension   . Leg pain   . Mitral regurgitation   . Morbid obesity ([place])   . Orthostatic hypotension   . Overweight(278.02)   . Renal insufficiency   . 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 Severe aortic insufficiency   . Sinus tachycardia   . Syncope     positional after dialysis... 2008   Assessment: 36  YOM on warfarin PTA for mech aortic valve with subtherapeutic INR started on heparin with no bolus and tight goals d/t bleed risk with current hematuria.   Last dose warfarin was on 5/2 and patient is s/p 5mg  Vitamin K. INR goal has been decreased to 2-2.5  5/10: Today Hgb slightly up 6.8>>7.0 s/p transfusion x1 yesterday.  Continued reports of patient having urethral bleeding; uncertain if this reflects an ongoing bleed. If so, pt may require embolization. Patient received IV Vitamin K 0.5mg  x1 yesterday evening. INR bumped with lower rate of rise than the day prior 2.8>>3.0, vitamin K last night likely not with a large effect on his INR yet. Will continue to watch but suspect INR will begin to drift down tomorrow.  Goal of Therapy:  Heparin level 0.3-0.5 units/ml Monitor platelets by anticoagulation protocol: Yes   Plan:  Holding heparin Holding warfarin  Daily INR and  CBC  Thank you for involving pharmacy in this patient's care.  Janae Bridgeman, PharmD PGY1 Pharmacy Resident Phone: [phone] 08/26/2018 7:24 AM

## 2018-08-26 NOTE — Progress Notes (Signed)
CRITICAL VALUE ALERT  Critical Value: Hgb. 6.6  Date & Time Notied: 08/26/18  08:08am  Provider Notified: yes  Orders Received/Actions taken: aware

## 2018-08-26 NOTE — Progress Notes (Signed)
   Subjective: No overnight events. [name] [name] reports sleeping better last night.  He was a little frustrated to hear his hgb continues to drift down requiring another transfusion.    Objective:  Vital signs in last 24 hours: Vitals:   08/26/18 0523 08/26/18 0930 08/26/18 0952 08/26/18 1019  BP: 99/66 98/69 99/70  103/69  Pulse: 98 100 (!) 101 100  Resp: 18 20    Temp: 98.6 F (37 C) 98 F (36.7 C) 98.1 F (36.7 C) 97.8 F (36.6 C)  TempSrc: Oral Oral Oral Oral  SpO2: 96% 97% 97%   Weight:      Height:       Gen: alert and oriented, no distress CV: RRR, mechanical valve click Pulm: CTA in the anterior fields, normal effort on room air Abd: bs+, soft, non-distended, non-tender Ext: no edema  Assessment/Plan:  Principal Problem:   Perinephric hematoma Active Problems:   ESRD (end stage renal disease) (HCC)   Morbid obesity (HCC)   Systolic CHF (HCC)   S/P aortic valve replacement with bileaflet mechanical valve   Acute blood loss anemia   Gross hematuria   Supratherapeutic INR  [name] [name] is a 53 yo man with CAD s/p CABG, CHF, ESRD on HD, polycystic kidney disease, and aortic valve replacement on Coumadin who presented withabdominalpain and bleeding from the penissecondary to an expanding left-sided perinephric hematoma from PCKD and supratherapeutic INR.Outside hospital CT demonstrated persistent and mildly increased hematoma,likely expanded from supratherapeutic INR.  L perinephric hematoma:  AVR on Coumadin Has requiredfive blood transfusions since admission.  May need to consider embolization as pt continues to have bleeding.  INR continues to elevate despite no coumadin and now off heparin.  Spoke with heme onc and this could be several possibilities there is a question of vit k deficiency 2/2 poor oral intake.    - Urology following, appreciate recs - Order CT abd/pelvis to reexamine today - IRfollowing, appreciate recs - Continue INR monitoring with goal  2-2.5, dose warfarin per pharmacy, required additional small 0.5 vit K dosage with improvement in INR to 2.0 this morning -Pain control: PO dilaudid 1mg  q6hrs prn with IV fentanyl q6hrs for breakthrough - Trend CBC  ESRD on HD TTS -HD per nephrology - Continue home fosrenol - Continue ESA per nephro for iron deficiency anemia in the setting of acute blood loss and CKD - Trend BMP   Hypotension:Chronic hypotension with MAPs 60-65. Asymptomatic.Blood pressure stable. Will fluid bolus and recheck Hb if pressures worsenor patient becomes symptomatic. - Continue home midodrine 15mg  TID -Discontinued homemetoprolol  CAD s/p CABG Systolic CHF Echo on 2/63 showed an EF of 78%HYI grade 1 diastolic dysfunction. No acute exacerbation. - Continue home amiodarone 200mg  daily - Volume management with HD  Leukocytosis: Continues to have stable leukocytosis.No obvious signs of infection. May be caused bystress reaction orresorption of known hematoma.  -  Improved somewhat, Trend CBC  Dispo: Anticipated discharge pending clinical improvement  [name] Roan, MD 08/26/2018, 11:42 AM

## 2018-08-26 NOTE — Progress Notes (Signed)
[facility]    NEPHROLOGY PROGRESS NOTE  SUBJECTIVE: Continues with abdominal pain.  Hgb drop over several days, requiring PRBCs on HD.  Patient frustrated with lack of progress and ongoing bleeding..  Denies chest pain, shortness of breath, nausea, vomiting, diarrhea or dysuria.  Continues to have some bloody urine.  All other review of systems are negative.   OBJECTIVE:  Vitals:   08/26/18 1019 08/26/18 1200  BP: 103/69 92/66  Pulse: 100 98  Resp:    Temp: 97.8 F (36.6 C) 98.7 F (37.1 C)  SpO2:  98%    Intake/Output Summary (Last 24 hours) at 08/26/2018 1221 Last data filed at 08/25/2018 2207 Gross per 24 hour  Intake 190 ml  Output -  Net 190 ml      General:  AAOx3 NAD HEENT: MMM [place] AT anicteric sclera Neck:  No JVD, no adenopathy CV:  Heart RRR  Lungs:  L/S CTA bilaterally Abd:  LUQ and LLQ tenderness, left flank pain GU:  Bladder non-palpable Extremities:  No LE edema. Skin:  No skin rash  MEDICATIONS:  . acetaminophen  650 mg Oral Q6H  . amiodarone  200 mg Oral Daily  . atorvastatin  80 mg Oral q1800  . darbepoetin (ARANESP) injection - DIALYSIS  100 mcg Intravenous Q Tue-HD  . feeding supplement (NEPRO CARB STEADY)  237 mL Oral TID BM  . feeding supplement (PRO-STAT SUGAR FREE 64)  30 mL Oral BID  . lanthanum  1,000 mg Oral TID PC  . midodrine  15 mg Oral TID WC  . multivitamin  1 tablet Oral QHS       LABS:   CBC Latest Ref Rng & Units 08/26/2018 08/25/2018 08/25/2018  WBC 4.0 - 10.5 K/uL 13.3(H) - 15.1(H)  Hemoglobin 13.0 - 17.0 g/dL 6.6(LL) 7.0(L) 6.8(LL)  Hematocrit 39.0 - 52.0 % 21.3(L) 21.9(L) 22.0(L)  Platelets 150 - 400 K/uL 303 - 333    CMP Latest Ref Rng & Units 08/25/2018 08/24/2018 08/23/2018  Glucose 70 - 99 mg/dL 85 94 78  BUN 6 - 20 mg/dL 53(H) 29(H) 56(H)  Creatinine 0.61 - 1.24 mg/dL 7.48(H) 5.25(H) 8.93(H)  Sodium 135 - 145 mmol/L 135 136 136  Potassium 3.5 - 5.1 mmol/L 4.1 4.1 4.5  Chloride 98 - 111 mmol/L 96(L) 100  97(L)  CO2 22 - 32 mmol/L 24 24 26   Calcium 8.9 - 10.3 mg/dL 8.2(L) 8.5(L) 8.4(L)  Total Protein 6.5 - 8.1 g/dL - - -  Total Bilirubin 0.3 - 1.2 mg/dL - - -  Alkaline Phos 38 - 126 U/L - - -  AST 15 - 41 U/L - - -  ALT 0 - 44 U/L - - -    Lab Results  Component Value Date   CALCIUM 8.2 (L) 08/25/2018   CAION 1.20 11/28/2017   PHOS 5.7 (H) 08/25/2018    No results found for: COLORURINE, APPEARANCEUR, LABSPEC, PHURINE, GLUCOSEU, HGBUR, BILIRUBINUR, KETONESUR, PROTEINUR, UROBILINOGEN, NITRITE, LEUKOCYTESUR    Component Value Date/Time   PHART 7.409 09/03/2017 1110   PCO2ART 36.4 09/03/2017 1110   PO2ART 67.0 (L) 09/03/2017 1110   HCO3 23.5 09/03/2017 1110   TCO2 29 11/28/2017 0743   ACIDBASEDEF 1.0 09/03/2017 1110   O2SAT 43.2 09/04/2017 0848       Component Value Date/Time   IRON 26 (L) 08/22/2018 0625   TIBC 126 (L) 08/22/2018 0625   FERRITIN 4,516 (H) 08/22/2018 0625   IRONPCTSAT 21 08/22/2018 0625       ASSESSMENT/PLAN:  53 year old male patient with end-stage renal disease due to polycystic kidney disease who presents with a left perinephric hematoma.  His hemoglobin was 6.4 on admission.  He is status post 4 units of packed red blood cells.  1.  Recurrent left perinephric hematoma.  Status post 4 units of PRBCs.  Evaluated by IR and urology, recommending conservative management.  If patient decompensates, would recommend angiogram/embolization with IR.  Urology is following.  Repeat imaging ordered this morning.  Suspect may need IR intervention.  2.  End-stage renal disease on hemodialysis.  Continue Tuesday, Thursday, Saturday dialysis schedule.  No heparin due to bleed.  3.  Chronic hypotension.  Blood pressures remain on the low side.  Continue midodrine.  4.  AVR.  On Coumadin.  5.  Anemia.  Secondary to end-stage renal disease and acute blood loss.  He is now status post 4 units of packed RBCs.  Continue Aranesp with dialysis.  Ferritin is stable.  6.   Secondary hyperparathyroidism.  Hectorol on hold.  On Fosrenol for phosphorus of 6.0.  7.  Nutrition.  Continue protein supplementation.     [place], DO, [place]

## 2018-08-27 ENCOUNTER — Encounter (HOSPITAL_COMMUNITY): Payer: Self-pay | Admitting: Radiology

## 2018-08-27 ENCOUNTER — Inpatient Hospital Stay (HOSPITAL_COMMUNITY): Payer: Medicare Other

## 2018-08-27 HISTORY — PX: IR FLUORO GUIDE CV LINE RIGHT: IMG2283

## 2018-08-27 HISTORY — PX: IR US GUIDE VASC ACCESS RIGHT: IMG2390

## 2018-08-27 LAB — HEMOGLOBIN AND HEMATOCRIT, BLOOD
HCT: 22.2 % — ABNORMAL LOW (ref 39.0–52.0)
Hemoglobin: 7.2 g/dL — ABNORMAL LOW (ref 13.0–17.0)

## 2018-08-27 LAB — CBC
HCT: 21.9 % — ABNORMAL LOW (ref 39.0–52.0)
Hemoglobin: 6.8 g/dL — CL (ref 13.0–17.0)
MCH: 26.3 pg (ref 26.0–34.0)
MCHC: 31.1 g/dL (ref 30.0–36.0)
MCV: 84.6 fL (ref 80.0–100.0)
Platelets: 311 10*3/uL (ref 150–400)
RBC: 2.59 MIL/uL — ABNORMAL LOW (ref 4.22–5.81)
RDW: 17.2 % — ABNORMAL HIGH (ref 11.5–15.5)
WBC: 13 10*3/uL — ABNORMAL HIGH (ref 4.0–10.5)
nRBC: 0 % (ref 0.0–0.2)

## 2018-08-27 LAB — PROTIME-INR
INR: 1.7 — ABNORMAL HIGH (ref 0.8–1.2)
Prothrombin Time: 20 seconds — ABNORMAL HIGH (ref 11.4–15.2)

## 2018-08-27 LAB — PREPARE RBC (CROSSMATCH)

## 2018-08-27 MED ORDER — IOHEXOL 300 MG/ML  SOLN: 15.0000 mL | Freq: Once | INTRAMUSCULAR | Status: DC | PRN

## 2018-08-27 MED ORDER — LIDOCAINE HCL 1 % IJ SOLN
INTRAMUSCULAR | Status: AC | PRN
  Administered 2018-08-27: 10 mL

## 2018-08-27 MED ORDER — LIDOCAINE HCL 1 % IJ SOLN
INTRAMUSCULAR | Status: AC
  Filled 2018-08-27: qty 20

## 2018-08-27 MED ORDER — IOHEXOL 300 MG/ML  SOLN
100.0000 mL | Freq: Once | INTRAMUSCULAR | Status: AC | PRN
  Administered 2018-08-27: 100 mL via INTRAVENOUS

## 2018-08-27 MED ORDER — SODIUM CHLORIDE 0.9% IV SOLUTION
Freq: Once | INTRAVENOUS | Status: AC
  Administered 2018-08-27: 11:00:00 via INTRAVENOUS

## 2018-08-27 NOTE — Progress Notes (Signed)
This patient received 70 ml's of IV Omnipaque 300 contrast extravasation into right upper arm during a CT abdomen/pelvis exam.  There is focal soft tissue swelling in the right upper arm at the site of extravasation. The area is soft and mildly tender to palpation. Distal sensation and motor are intact.  Patient instructed to apply ice to the area and keep the right arm elevated for 48 hours.  Monitor for increased pain, swelling, skin blistering or worsening numbness or tingling.  Patient verbalized understanding and all questions answered.

## 2018-08-27 NOTE — Procedures (Signed)
Pre procedural Diagnosis: Poor venous access Post Procedural Diagnosis: Same  Successful placement of right EJ approach non-tunneled CVC DL 20 cm CVC with tip at the superior caval-atrial junction.    EBL: None  No immediate post procedural complication.  The PICC line is ready for immediate use.  Ronny Bacon, MD Pager #: [phone]

## 2018-08-27 NOTE — Progress Notes (Signed)
Subjective:  Cos Abdominal pain and continued Hematuria / no SOB or CP  Objective Vital signs in last 24 hours: Vitals:   08/26/18 1537 08/26/18 1713 08/26/18 2115 08/27/18 0957  BP: 117/77 107/74 109/79 106/72  Pulse: (!) 101 98 (!) 103 (!) 101  Resp: 18  18 18   Temp: 98.3 F (36.8 C)  98 F (36.7 C) 98.9 F (37.2 C)  TempSrc: Oral  Oral Oral  SpO2: 99% 95% 98% 100%  Weight:      Height:       Weight change:   Physical Exam: General: AlertOx3 NAD HEENT: MMM [place] AT anicteric sclera Neck:  No JVD, no adenopathy  Heart RRR no  m, r g Lungs:  CTA bilaterally, non labored  Breathing  Abd: Now all quads tender, BS +, LUQ and LLQ tenderness, left flank pain Extremities:  No LE edema  HD Access = pos bruit LFA AVF   OP Hemodialysis prescription:TTS, [facility], 4 hours 45 minutes, left forearm AVF(15G), BFR 450, DFR 800, 2K/2.5 calcium. Heparin NONE . EDW 133.5 kg. [name] [name] 2.0 mcg TIW, Parsabiv 2.5mg  TIW/ epo 6600  Problem/Plan:  53 year old male patient with end-stage renal disease due to polycystic kidney disease who presents with a left perinephric hematoma.  His hemoglobin was 6.4 on admission.  He is status post 4 units of packed red blood cells.  1.  Recurrent left perinephric hematoma.  Status post 4 units of PRBCs.  Evaluated by IR and urology, Am hgb back down 6.8 /noted Urology this am sates "possibility for embolization discussed further with patient and with [place]. VIR will see and evaluate patient to consider proceeding with embolization this week ".  CT scan yesterday demonstrated slightly increased size of perinephric hematoma .  2.  End-stage renal disease on hemodialysis.  Continue Tuesday, Thursday, Saturday dialysis schedule.  No heparin due to bleed.  3.  Chronic hypotension.  Blood pressures remain on the low side.  Continue midodrine.  4.  AVR.  was On Coumadin ,INR 1.7 / plan per admit team  Iv Hep off coumadin   5.  Anemia.  Secondary to  end-stage renal disease and acute blood loss.  He is now status post 4 units of packed RBCs.  Continue Aranesp with dialysis.  Ferritin is stable.  6.  Secondary hyperparathyroidism.  [name] [name] on hold with ca >10 .  On Fosrenol for phosphorus of 5.7 .  7.  Nutrition.  Continue protein supplementation. When pos   Ernest Haber, PA-C [facility] [phone] 08/27/2018,10:05 AM  LOS: 8 days   Labs: Basic Metabolic Panel: Recent Labs  Lab 08/23/18 0800 08/24/18 0302 08/25/18 0701  NA 136 136 135  K 4.5 4.1 4.1  CL 97* 100 96*  CO2 26 24 24   GLUCOSE 78 94 85  BUN 56* 29* 53*  CREATININE 8.93* 5.25* 7.48*  CALCIUM 8.4* 8.5* 8.2*  PHOS 6.0* 5.0* 5.7*   Liver Function Tests: Recent Labs  Lab 08/23/18 0800 08/24/18 0302 08/25/18 0701  ALBUMIN 1.6* 1.6* 1.5*   No results for input(s): LIPASE, AMYLASE in the last 168 hours. No results for input(s): AMMONIA in the last 168 hours. CBC: Recent Labs  Lab 08/24/18 1815 08/25/18 0422  08/26/18 0713 08/26/18 1441 08/26/18 1956 08/27/18 0639  WBC 16.8* 15.1*  --  13.3*  --  15.3* 13.0*  HGB 7.0* 6.8*   < > 6.6* 7.1* 7.5* 6.8*  HCT 22.2* 22.0*   < > 21.3* 22.7* 23.6* 21.9*  MCV 84.7 84.9  --  85.2  --  84.3 84.6  PLT 350 333  --  303  --  321 311   < > = values in this interval not displayed.   Cardiac Enzymes: No results for input(s): CKTOTAL, CKMB, CKMBINDEX, TROPONINI in the last 168 hours. CBG: No results for input(s): GLUCAP in the last 168 hours.  Studies/Results: Ct Abdomen Pelvis Wo Contrast  Result Date: 08/26/2018 CLINICAL DATA:  History of perinephric hematoma.  Lower back pain. EXAM: CT ABDOMEN AND PELVIS WITHOUT CONTRAST TECHNIQUE: Multidetector CT imaging of the abdomen and pelvis was performed following the standard protocol without IV contrast. COMPARISON:  CT scan of Aug 19, 2018. FINDINGS: Lower chest: Minimal left pleural effusion is noted with adjacent subsegmental atelectasis.  Hepatobiliary: Solitary gallstone is noted. No biliary dilatation is noted. No focal abnormality is noted in the patent parenchyma on these unenhanced images. Pancreas: Unremarkable. No pancreatic ductal dilatation or surrounding inflammatory changes. Spleen: Normal in size without focal abnormality. Adrenals/Urinary Tract: Adrenal glands are unremarkable. Severe polycystic kidney disease is again noted. The left kidney is markedly enlarged with multiple layering high density area is consistent with large perinephric hematoma. The kidney currently measures 24 by 20 x 16 cm which is slightly enlarged compared to prior exam. Urinary bladder is unremarkable. No definite hydronephrosis is noted. Stomach/Bowel: The stomach appears normal. The appendix appears normal. There is no evidence of bowel obstruction or inflammation. Sigmoid diverticulosis is noted without inflammation. Vascular/Lymphatic: Aortic atherosclerosis. No enlarged abdominal or pelvic lymph nodes. Reproductive: Prostate is unremarkable. Other: Stable fat containing ventral hernia is noted in periumbilical region. Musculoskeletal: No acute or significant osseous findings. IMPRESSION: Findings consistent with polycystic kidney disease. Continued presence of large left perinephric hematoma which may be slightly increased in size compared to prior exam. No definite hydronephrosis is noted currently. Sigmoid diverticulosis without inflammation. Cholelithiasis. Aortic Atherosclerosis (ICD10-I70.0). Electronically Signed   By: Marijo Conception M.D.   On: 08/26/2018 12:50   Medications:  . sodium chloride   Intravenous Once  . acetaminophen  650 mg Oral Q6H  . amiodarone  200 mg Oral Daily  . atorvastatin  80 mg Oral q1800  . darbepoetin (ARANESP) injection - DIALYSIS  100 mcg Intravenous Q Tue-HD  . feeding supplement (NEPRO CARB STEADY)  237 mL Oral TID BM  . feeding supplement (PRO-STAT SUGAR FREE 64)  30 mL Oral BID  . lanthanum  1,000 mg Oral TID  PC  . midodrine  15 mg Oral TID WC  . multivitamin  1 tablet Oral QHS

## 2018-08-27 NOTE — Progress Notes (Signed)
This patient has received 70 ml's of IV omni300 contrast extravasation into his right upper arm during a CT Abd/pelvis w/o and with exam.  The exam was performed on (date) Monday, May 11th, 2020 at 1300pm.  Site / affected area assessed by Dr Fabiola Backer

## 2018-08-27 NOTE — Progress Notes (Signed)
   Subjective: The night team evaluated Mr. Abercrombie overnight for worsening abdominal pain, consistent with the pain he has intermittently had since admission. He was having mild bleeding from his urethra at the time. Repeat Hb was 7.5 and INR was subtherapeutic at 1.6. He was given his prn dose of fentanyl.   This morning, his Hb trended down to 6.8 and INR remains subtherapeutic at 1.7. He states that his pain is better controlled this morning after receiving medication. He continues to have slow bleeding from the penis. He is frustrated to hear that he will need another blood transfusion.   Objective:  Vital signs in last 24 hours: Vitals:   08/26/18 1318 08/26/18 1537 08/26/18 1713 08/26/18 2115  BP: 103/62 117/77 107/74 109/79  Pulse: 97 (!) 101 98 (!) 103  Resp:  18  18  Temp: 97.6 F (36.4 C) 98.3 F (36.8 C)  98 F (36.7 C)  TempSrc: Oral Oral  Oral  SpO2: 99% 99% 95% 98%  Weight:      Height:       Gen: alert and oriented, no distress CV: RRR, mechanical aortic valve click Abd: bs+, soft, mildly distended, diffusely ttp Ext: no edema   Assessment/Plan:  Principal Problem:   Perinephric hematoma Active Problems:   ESRD (end stage renal disease) (HCC)   Morbid obesity (HCC)   Systolic CHF (HCC)   S/P aortic valve replacement with bileaflet mechanical valve   Acute blood loss anemia   Gross hematuria   Supratherapeutic INR  Mr. Rann is a 53 yo man with CAD s/p CABG, CHF, ESRD on HD, polycystic kidney disease, and aortic valve replacement on Coumadin who presented withabdominalpain and bleeding from the penissecondary to an expanding left-sided perinephric hematoma from PCKD and supratherapeutic INR.Outside hospital CT demonstrated persistent and mildly increased hematoma,likely expanded from supratherapeutic INR.  L perinephric hematoma:  AVR on Coumadin Has requiredfive blood transfusions since admission, will need a 6th today. Slight increase in hematoma  size on CT abdomen pelvis from yesterday. As he continues to have bleeding with therapeutic/subtherapeutic INR, he may benefit from embolization procedure with IR. Urology agrees. IR will see him today for procedural planning. Will hold anticoagulation given active bleeding.  - Urology following, appreciate recs - IRfollowing, appreciate recs -Hold warfarin, trend INR -Pain control: PO dilaudid 1mg q6hrs prnwith IVfentanyl q6hrs for breakthrough - Trend CBC  ESRD on HD TTS -HD per nephrology - Continue home fosrenol - Continue ESA per nephro for iron deficiency anemia in the setting of acute blood loss and CKD - Trend BMP   Hypotension:Chronic hypotension with MAPs 60-65.Asymptomatic.Blood pressure stable.Will fluid bolus and recheck Hb if pressures worsenor patient becomes symptomatic. - Continue home midodrine 15mg  TID -Discontinued homemetoprolol  CAD s/p CABG Systolic CHF Echo on 8/88 showed an EF of 28%MKL grade 1 diastolic dysfunction. No acute exacerbation. - Continue home amiodarone 200mg  daily - Volume management with HD  Leukocytosis: Continues to have stable leukocytosis.No obvious signs of infection. May be caused bystress reaction orresorption of known hematoma.  -  Improved somewhat, Trend CBC  Dispo: Anticipated discharge once Hb stabilizes.  Bilbo Carcamo, [name], MD 08/27/2018, 6:10 AM Pager: [phone]

## 2018-08-27 NOTE — Progress Notes (Signed)
  Date: 08/27/2018  Patient name: KHALI ALBANESE  Medical record number: [id number]  Date of birth: 12/29/1965   I have seen and evaluated this patient and I have discussed the plan of care with the house staff. Please see their note for complete details. I concur with their findings with the following additions/corrections:   Unfortunately, Hgb has dropped again this morning despite subtherapeutic INR.  Given his ongoing need for transfusions over the last few days despite no anticoagulation, suspect we will not be able to resolve the situation without some sort of intervention.  Appreciate urology and IR reevaluating him today and consideration of possible embolization.  Unfortunately, appetite remains poor and intake is minimal.  According to flow sheets, 0% of meals eaten over the past 2 days.  Mass-effect of his large perinephric hematoma on the left may be contributing to his poor appetite.  I am concerned his poor nutrition will have negative impact on his recovery from this and overall health.  Lenice Pressman, M.D., Ph.D. 08/27/2018, 12:15 PM

## 2018-08-27 NOTE — Progress Notes (Signed)
[place] for heparin/warfarin Indication: Mechanical valve  No Known Allergies  Patient Measurements: Height: 6\' 1"  (185.4 cm) Weight: 284 lb 9.6 oz (129.1 kg) IBW/kg (Calculated) : 79.9  Vital Signs: Temp: 98.6 F (37 C) (05/11 1123) Temp Source: Oral (05/11 1123) BP: 107/75 (05/11 1123) Pulse Rate: 100 (05/11 1123)  Labs: Recent Labs    08/25/18 0701  08/26/18 0713 08/26/18 1441 08/26/18 1956 08/27/18 0639  HGB  --    < > 6.6* 7.1* 7.5* 6.8*  HCT  --    < > 21.3* 22.7* 23.6* 21.9*  PLT  --   --  303  --  321 311  LABPROT  --    < > 22.6*  --  19.2* 20.0*  INR  --    < > 2.0*  --  1.6* 1.7*  CREATININE 7.48*  --   --   --   --   --    < > = values in this interval not displayed.    Estimated Creatinine Clearance: 16.1 mL/min (A) (by C-G formula based on SCr of 7.48 mg/dL (H)).   Medical History: Past Medical History:  Diagnosis Date  . Aneurysm ([place])     Right arm fistula 3 aneurysms 2011,   plans to have a new procedure  . Angina   . Chest discomfort   . CHF (congestive heart failure) ([place])   . Coronary artery disease   . Coronary artery disease involving native coronary artery of native heart without angina pectoris   . Dialysis patient ([place])   . Dysrhythmia   . ESRD (end stage renal disease) ([place])    on hemodialysis [place]_S  . Hypertension   . Leg pain   . Mitral regurgitation   . Morbid obesity ([place])   . Orthostatic hypotension   . Overweight(278.02)   . Renal insufficiency   . 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 Severe aortic insufficiency   . Sinus tachycardia   . Syncope     positional after dialysis... 2008   Assessment: 46 YOM on warfarin PTA for mech aortic valve with subtherapeutic INR started on heparin with no bolus and tight goals d/t bleed risk with current  hematuria.   Last dose warfarin was on 5/2 and patient is s/p 5mg  Vitamin K. INR goal has been decreased to 2-2.5  5/11: Hgb continues to trend down.  Plan for transfusion again today.  Continued reports of patient having urethral bleeding.  CT showing slight increase in size of perinephretic hematoma.  IR consulted for possible embolization. Spoke with Dr. Annie Paras, will continue to hold anticoagulation.  Goal of Therapy:  Heparin level 0.3-0.5 units/ml Monitor platelets by anticoagulation protocol: Yes   Plan:  Holding heparin Holding warfarin  Daily INR and CBC  Thank you for involving pharmacy in this patient's care.  [employer], Pharm.D., BCPS Clinical Pharmacist Pager: [phone] Clinical phone for 08/27/2018 from 8:30-4:00 is [phone].  **Pharmacist phone directory can now be found on [email or web address] ([email or web address]).  Listed under [place].  08/27/2018 12:47 PM

## 2018-08-27 NOTE — Progress Notes (Signed)
[name] [name] examined at bedside due to concerns for worsening abdominal pain.  He states that he has had intermittent central and left-sided abdominal pain since his admission.  He states that over the last several hours it has worsened and is difficult for him to move.  He does not believe that the pain medication that he is received today has been helping (has Dilaudid and fentanyl as needed).  He denies nausea and vomiting but does state that he has had a decreased appetite and has not eaten in over several days.  He does continue to have bleeding from his penis (soaked a quarter of a hand towel over the last 2-3 hours).   Blood pressure 109/79, pulse (!) 103, temperature 98 F (36.7 C), temperature source Oral, resp. rate 18, height 6\' 1"  (1.854 m), weight 129.1 kg, SpO2 98 %. General: Lying in bed in no acute distress Cardiovascular: Normal rate, regular rhythm Respiratory: Auscultated anteriorly and grossly clear lung sounds appreciated, normal work of breathing, on 2 L nasal cannula Abdominal: Diffuse tenderness to palpation (worse at central abdomen and LUQ), no rebound or guarding, hyperactive bowel sounds  Repeat CT Abdomen 08/26/18: IMPRESSION: Findings consistent with polycystic kidney disease. Continued presence of large left perinephric hematoma which may be slightly increased in size compared to prior exam. No definite hydronephrosis is noted currently. Sigmoid diverticulosis without inflammation. Cholelithiasis.  Assessment/plan: [name] [name] worsening central and left upper quadrant pain is likely due to his known left perinephric hematoma.  Repeat CT abdomen yesterday showed that the hematoma had slightly increased in size compared to the prior exam.  I ordered a repeat CBC which showed an improved hemoglobin of 7.5 from this morning's 7.1.  Given his stable hemoglobin I do not believe that his left perinephric hematoma is substantially increasing in size or that he is bleeding into  his abdomen. Will thus plan on holding off on repeat imaging.  I asked the RN to please give his fentanyl as he has not received this since 5 PM.  Additionally an INR was checked which was subtherapeutic at 1.6 (goal of 2-3).  Consulted pharmacy who will adjust tomorrow evening's dose accordingly.

## 2018-08-27 NOTE — Progress Notes (Signed)
Patient ID: [name] [name], male   DOB: Aug 14, 1965, 53 y.o.   MRN: [id number]    Subjective: Worsened left flank pain and abdominal pain last night. Hgb last night stable, but decreased to 6.8 today. Over the weekend, required additional vitamin K for INR 3 despite no warfarin since admission. Continued small amount of blood per urethra; likely related to clot in bladder. CT scan yesterday demonstrated slightly increased size of perinephric hematoma.   Objective: Vital signs in last 24 hours: Temp:  [97.6 F (36.4 C)-98.9 F (37.2 C)] 98.2 F (36.8 C) (05/11 1057) Pulse Rate:  [97-103] 99 (05/11 1057) Resp:  [18-20] 20 (05/11 1057) BP: (92-117)/(62-79) 98/64 (05/11 1057) SpO2:  [95 %-100 %] 100 % (05/11 1057)  Intake/Output from previous day: 05/10 0701 - 05/11 0700 In: 320 [P.O.:320] Out: 0  Intake/Output this shift: No intake/output data recorded.  Physical Exam:  General: Alert and oriented Abd: Moderate left CVAT and left palpable kidney TTP  Lab Results: Recent Labs    08/26/18 1441 08/26/18 1956 08/27/18 0639  HGB 7.1* 7.5* 6.8*  HCT 22.7* 23.6* 21.9*   BMET Recent Labs    08/25/18 0701  NA 135  K 4.1  CL 96*  CO2 24  GLUCOSE 85  BUN 53*  CREATININE 7.48*  CALCIUM 8.2*     Studies/Results: Ct Abdomen Pelvis Wo Contrast  Result Date: 08/26/2018 CLINICAL DATA:  History of perinephric hematoma.  Lower back pain. EXAM: CT ABDOMEN AND PELVIS WITHOUT CONTRAST TECHNIQUE: Multidetector CT imaging of the abdomen and pelvis was performed following the standard protocol without IV contrast. COMPARISON:  CT scan of Aug 19, 2018. FINDINGS: Lower chest: Minimal left pleural effusion is noted with adjacent subsegmental atelectasis. Hepatobiliary: Solitary gallstone is noted. No biliary dilatation is noted. No focal abnormality is noted in the patent parenchyma on these unenhanced images. Pancreas: Unremarkable. No pancreatic ductal dilatation or surrounding inflammatory  changes. Spleen: Normal in size without focal abnormality. Adrenals/Urinary Tract: Adrenal glands are unremarkable. Severe polycystic kidney disease is again noted. The left kidney is markedly enlarged with multiple layering high density area is consistent with large perinephric hematoma. The kidney currently measures 24 by 20 x 16 cm which is slightly enlarged compared to prior exam. Urinary bladder is unremarkable. No definite hydronephrosis is noted. Stomach/Bowel: The stomach appears normal. The appendix appears normal. There is no evidence of bowel obstruction or inflammation. Sigmoid diverticulosis is noted without inflammation. Vascular/Lymphatic: Aortic atherosclerosis. No enlarged abdominal or pelvic lymph nodes. Reproductive: Prostate is unremarkable. Other: Stable fat containing ventral hernia is noted in periumbilical region. Musculoskeletal: No acute or significant osseous findings. IMPRESSION: Findings consistent with polycystic kidney disease. Continued presence of large left perinephric hematoma which may be slightly increased in size compared to prior exam. No definite hydronephrosis is noted currently. Sigmoid diverticulosis without inflammation. Cholelithiasis. Aortic Atherosclerosis (ICD10-I70.0). Electronically Signed   By: Marijo Conception M.D.   On: 08/26/2018 12:50    Assessment/Plan: 1) Left perinephric hematoma: Patient continues to require blood transfusions over the weekend and today. Given continued need for ongoing transfusions wih INR < 2 today, possibility for embolization discussed further with patient and with VIR. VIR will see and evaluate patient to consider proceeding with embolization this week. Continue bedrest and continue serial Hgb and INR measurements. Recommend holding warfarin and continuing IV heparin for anticoagulation. Will also need repeat imaging of the left kidney in about 3 months to assess for any possible underlying renal mass of concern. Will  continue to  follow.   LOS: 8 days   Dorothey Baseman 08/27/2018, 11:23 AM

## 2018-08-28 LAB — CBC
HCT: 21.1 % — ABNORMAL LOW (ref 39.0–52.0)
Hemoglobin: 6.8 g/dL — CL (ref 13.0–17.0)
MCH: 27 pg (ref 26.0–34.0)
MCHC: 32.2 g/dL (ref 30.0–36.0)
MCV: 83.7 fL (ref 80.0–100.0)
Platelets: 291 10*3/uL (ref 150–400)
RBC: 2.52 MIL/uL — ABNORMAL LOW (ref 4.22–5.81)
RDW: 17.1 % — ABNORMAL HIGH (ref 11.5–15.5)
WBC: 15.5 10*3/uL — ABNORMAL HIGH (ref 4.0–10.5)
nRBC: 0 % (ref 0.0–0.2)

## 2018-08-28 LAB — HEMOGLOBIN AND HEMATOCRIT, BLOOD
HCT: 23.3 % — ABNORMAL LOW (ref 39.0–52.0)
Hemoglobin: 7.5 g/dL — ABNORMAL LOW (ref 13.0–17.0)

## 2018-08-28 LAB — RENAL FUNCTION PANEL
Albumin: 1.4 g/dL — ABNORMAL LOW (ref 3.5–5.0)
Anion gap: 16 — ABNORMAL HIGH (ref 5–15)
BUN: 65 mg/dL — ABNORMAL HIGH (ref 6–20)
CO2: 26 mmol/L (ref 22–32)
Calcium: 8.1 mg/dL — ABNORMAL LOW (ref 8.9–10.3)
Chloride: 91 mmol/L — ABNORMAL LOW (ref 98–111)
Creatinine, Ser: 7.78 mg/dL — ABNORMAL HIGH (ref 0.61–1.24)
GFR calc Af Amer: 8 mL/min — ABNORMAL LOW (ref >60)
GFR calc non Af Amer: 7 mL/min — ABNORMAL LOW (ref >60)
Glucose, Bld: 104 mg/dL — ABNORMAL HIGH (ref 70–99)
Phosphorus: 5.9 mg/dL — ABNORMAL HIGH (ref 2.5–4.6)
Potassium: 4.2 mmol/L (ref 3.5–5.1)
Sodium: 133 mmol/L — ABNORMAL LOW (ref 135–145)

## 2018-08-28 LAB — PROTIME-INR
INR: 2.3 — ABNORMAL HIGH (ref 0.8–1.2)
INR: 2.4 — ABNORMAL HIGH (ref 0.8–1.2)
INR: 1.9 — ABNORMAL HIGH (ref 0.8–1.2)
Prothrombin Time: 24.8 seconds — ABNORMAL HIGH (ref 11.4–15.2)
Prothrombin Time: 25.5 seconds — ABNORMAL HIGH (ref 11.4–15.2)
Prothrombin Time: 21.6 seconds — ABNORMAL HIGH (ref 11.4–15.2)

## 2018-08-28 LAB — PREPARE RBC (CROSSMATCH)

## 2018-08-28 MED ORDER — NEPRO/CARBSTEADY PO LIQD
237.0000 mL | Freq: Two times a day (BID) | ORAL | Status: DC
  Administered 2018-08-29 – 2018-09-04 (×2): 237 mL via ORAL
  Filled 2018-08-28 (×14): qty 237

## 2018-08-28 MED ORDER — MIDODRINE HCL 5 MG PO TABS
ORAL_TABLET | ORAL | Status: AC
  Administered 2018-08-28: 15 mg via ORAL
  Filled 2018-08-28: qty 3

## 2018-08-28 MED ORDER — VITAMIN K1 10 MG/ML IJ SOLN
0.5000 mg | Freq: Once | INTRAVENOUS | Status: AC
  Administered 2018-08-28: 0.5 mg via INTRAVENOUS
  Filled 2018-08-28: qty 0.05

## 2018-08-28 MED ORDER — SODIUM CHLORIDE 0.9% IV SOLUTION: Freq: Once | INTRAVENOUS | Status: DC

## 2018-08-28 MED ORDER — DARBEPOETIN ALFA 100 MCG/0.5ML IJ SOSY
PREFILLED_SYRINGE | INTRAMUSCULAR | Status: AC
  Administered 2018-08-28: 11:00:00
  Filled 2018-08-28: qty 0.5

## 2018-08-28 MED ORDER — SODIUM CHLORIDE 0.9% FLUSH
10.0000 mL | Freq: Two times a day (BID) | INTRAVENOUS | Status: DC
  Administered 2018-08-28 – 2018-09-04 (×7): 10 mL

## 2018-08-28 MED ORDER — VITAMIN K1 10 MG/ML IJ SOLN
2.0000 mg | Freq: Once | INTRAVENOUS | Status: AC
  Administered 2018-08-28: 2 mg via INTRAVENOUS
  Filled 2018-08-28: qty 0.2

## 2018-08-28 NOTE — Progress Notes (Signed)
CRITICAL VALUE ALERT  Critical Value: hemoglobin level of 6.8  Date & Time Notied:  08/28/2018 at 0411  Provider Notified: Alfonse Spruce, MD  notified  Orders Received/Actions taken: awaiting for orders

## 2018-08-28 NOTE — Progress Notes (Signed)
Nutrition Follow-up  DOCUMENTATION CODES:   Obesity unspecified  INTERVENTION:   Day 8 with poor intake. Would consider initiation of nutrition support if this continues. He is scheduled to be NPO at midnight for procedure.    Continue Nepro Shake po BID, each supplement provides 425 kcal and 19 grams protein  Add Magic cup TID with meals, each supplement provides 290 kcal and 9 grams of protein  Continue Renal MVI  NUTRITION DIAGNOSIS:   Increased nutrient needs related to chronic illness(ESRD on HD) as evidenced by estimated needs.  Ongoing  GOAL:   Patient will meet greater than or equal to 90% of their needs  Not meeting  MONITOR:   PO intake, Supplement acceptance, Labs, Skin, I & O's, Weight trends  REASON FOR ASSESSMENT:   Consult Diet education  ASSESSMENT:   Patient with PMH significant for ESRD on HD, polycystic kidney disease, CAD s/p CABG, CHF, and s/p AVR. Recently hospitalized 4/15-4/21 for left-sided perinephric hematoma in setting of PCKD and ruptured hemorrhagic cyst. Presents this admission with increased perinephric hematoma.    RD working remotely.  Pt has required 6 units of blood this admission. Plan for renal arteriogram with embolization tomorrow once INR wdl. Pt underwent HD treatment this am, it was completed but goal was not met due to low BP.    Attempted to call pt multiple times without answer. Meal completions charted as 0% since admit. He consumed a pack of graham crackers this am. Suspect appetite is poor. Spoke with RN via phone who reports pt refused lunch after dialysis and did not eat breakfast. Pt is currently day 8 of admission and has had minimal intake. Would consider initiation of nutrition support if this continues. RD to add Magic Cup as he had these last admission. Will monitor closely.   EDW 133.5 kg. Wt prior to HD this am 135.2 kg  Medications: aranesp, fosrenol, midodrine, rena-vit Labs: Na 133 (L) Phosphorus 5.9  corrected calcium 10.2 (wdl) CBG 85-104  Diet Order:   Diet Order            Diet NPO time specified Except for: Sips with Meds  Diet effective midnight        Diet renal with fluid restriction Fluid restriction: 1200 mL Fluid; Room service appropriate? Yes; Fluid consistency: Thin  Diet effective now              EDUCATION NEEDS:   Not appropriate for education at this time  Skin:  Skin Assessment: Skin Integrity Issues: Skin Integrity Issues:: Other (Comment) Other: MASD- mid chest, left thigh AND open wound mid chest   Last BM:  5/11  Height:   Ht Readings from Last 1 Encounters:  08/21/18 '6\' 1"'$  (1.854 m)    Weight:   Wt Readings from Last 1 Encounters:  08/28/18 135.2 kg    Ideal Body Weight:  83.6 kg  BMI:  Body mass index is 39.32 kg/m.  Estimated Nutritional Needs:   Kcal:  2400-2600 kcal   Protein:  120-135 grams  Fluid:  1000 ml + UOP   Mariana Single RD, LDN Clinical Nutrition Pager # [phone]

## 2018-08-28 NOTE — Progress Notes (Signed)
[facility] Progress Note   Subjective:   Seen on HD today.  Feels ok.  Contrast extravascation in arm with CT yesterday, asymptomatic.  No dyspnea. For 2u pRBC with tx today. Ongoing planning for control of bleeding.   Objective Vitals:   08/28/18 0638 08/28/18 0651 08/28/18 0700 08/28/18 0730  BP: (!) 88/58 94/62 (!) 90/55 (!) 81/48  Pulse: 94 93 91 95  Resp: (!) 21     Temp: 97.6 F (36.4 C)     TempSrc: Oral     SpO2:      Weight: 135.2 kg     Height:       Physical Exam General: comfortably lying flat in bed Heart: RRR Lungs: clear to bases, normal WOB Abdomen: mildly TTP throughout Extremities: no edema Dialysis Access:  L FA AVF with needles in, no access pressure issues  Additional Objective Labs: Basic Metabolic Panel: Recent Labs  Lab 08/24/18 0302 08/25/18 0701 08/28/18 0326  NA 136 135 133*  K 4.1 4.1 4.2  CL 100 96* 91*  CO2 _0 GLUCOSE 94 85 104*  BUN 29* 53* 65*  CREATININE 5.25* 7.48* 7.78*  CALCIUM 8.5* 8.2* 8.1*  PHOS 5.0* 5.7* 5.9*   Liver Function Tests: Recent Labs  Lab 08/24/18 0302 08/25/18 0701 08/28/18 0326  ALBUMIN 1.6* 1.5* 1.4*   No results for input(s): LIPASE, AMYLASE in the last 168 hours. CBC: Recent Labs  Lab 08/25/18 0422  08/26/18 0713  08/26/18 1956 08/27/18 0639 08/27/18 1637 08/28/18 0326  WBC 15.1*  --  13.3*  --  15.3* 13.0*  --  15.5*  HGB 6.8*   < > 6.6*   < > 7.5* 6.8* 7.2* 6.8*  HCT 22.0*   < > 21.3*   < > 23.6* 21.9* 22.2* 21.1*  MCV 84.9  --  85.2  --  84.3 84.6  --  83.7  PLT 333  --  303  --  321 311  --  291   < > = values in this interval not displayed.   Blood Culture    Component Value Date/Time   SDES AORTIC VALVE TISSUE 09/01/2017 1220   SPECREQUEST NONE 09/01/2017 1220   CULT  09/01/2017 1220    No growth aerobically or anaerobically. Performed at [facility], [place] [address] [place], [place] [address]    REPTSTATUS 09/06/2017 FINAL 09/01/2017 1220     Cardiac Enzymes: No results for input(s): CKTOTAL, CKMB, CKMBINDEX, TROPONINI in the last 168 hours. CBG: No results for input(s): GLUCAP in the last 168 hours. Iron Studies: No results for input(s): IRON, TIBC, TRANSFERRIN, FERRITIN in the last 72 hours. _1 @ Studies/Results: Ct Abdomen Pelvis W Wo Contrast  Result Date: 08/27/2018 CLINICAL DATA:  History end-stage renal disease secondary to polycystic kidney disease now with enlarging left-sided renal hemorrhage EXAM: CT ABDOMEN AND PELVIS WITHOUT AND WITH CONTRAST TECHNIQUE: Multidetector CT imaging of the abdomen and pelvis was performed following the standard protocol before and following the bolus administration of intravenous contrast. CONTRAST:  130m OMNIPAQUE IOHEXOL 300 MG/ML  SOLN COMPARISON:  08/26/2018; 08/19/2018; 08/04/2018; 08/01/2018; 07/23/2018 FINDINGS: Lower chest: Limited visualization lower thorax demonstrates trace left-sided pleural effusion with associated dependent subpleural ground-glass atelectasis. Normal heart size. Coronary artery calcifications. Post median sternotomy and aortic valve replacement. No pericardial effusion. Hepatobiliary: Normal hepatic contour. No discrete hepatic lesions. Unchanged approximately 0.8 cm stone lying dependently within the lumen of otherwise normal-appearing gallbladder. No gallbladder wall thickening or pericholecystic fluid.  No intrahepatic biliary duct dilatation. No ascites. Pancreas: Normal appearance of the pancreas. Spleen: Subcentimeter hypoattenuating lesion within the hilum of the spleen is too small to adequately characterize though may represent a splenic cyst. Adrenals/Urinary Tract: Re-demonstrated sequela of advanced polycystic kidney disease. Again, the left kidney is near completely replaced with multiple hemorrhagic cysts with multiple hematocrit levels demonstrated on the precontrast images (representative images 26, 37 and 57, series 3). The hemorrhagic left  kidney has continued to enlarge, now measuring approximately 20.4 x 17.7 x 23.7 cm (axial image 63, series 10; coronal image 46, series 13), previously, 18.3 x 16.3 x 21.9 cm (compared to the 08/19/2018), now also with slight extension to involve the caudal aspect of left retroperitoneal space (image 111, series 15) but without definitive intraperitoneal extension. Ill-defined punctate (subcentimeter) areas of vessel irregularity are seen involving the anterior (axial image 81, series 10; coronal image 27, series 13) and posteromedial aspect (axial image 89, series 10; coronal image 58, series 13) of the left kidney without sizable area of contrast extravasation. No discrete underlying hyperenhancing left-sided renal lesion. No evidence of right-sided renal hemorrhage. Normal appearance of the bilateral adrenal glands. Normal appearance of the urinary bladder given degree of distention. Stomach/Bowel: Rather extensive colonic diverticulosis without evidence of superimposed acute diverticulitis. No pneumoperitoneum, pneumatosis or portal venous gas. Vascular/Lymphatic: Atherosclerotic plaque within a normal caliber abdominal aorta. Note is made of a solitary left renal artery. While the left renal artery appears somewhat diminutive does appear patent and again, there are no sizable areas of contrast extravasation to suggest acute arterial extravasation. Reproductive: Normal appearance the prostate gland. No free fluid the pelvic cul-de-sac. Other: Diffuse body wall anasarca. Note is made of a approximately 5.9 x 3.6 cm mesenteric fat containing periumbilical hernia. Musculoskeletal: No acute or aggressive osseous abnormalities. Moderate severe multilevel lumbar spine DDD. Stigmata of DISH within the caudal aspect of the thoracic spine. IMPRESSION: 1. Sequela of advanced polycystic kidney disease with continued enlargement of complex intraparenchymal and perinephric hemorrhage replacing the entirety of the left kidney  without discrete area of contrast extravasation or hyperenhancing underlying renal lesion. 2. Colonic diverticulosis without evidence of superimposed acute diverticulitis. 3. Cholelithiasis without evidence of cholecystitis. 4.  Aortic Atherosclerosis (ICD10-I70.0). Electronically Signed   By: Sandi Mariscal M.D.   On: 08/27/2018 16:23   Ct Abdomen Pelvis Wo Contrast  Result Date: 08/26/2018 CLINICAL DATA:  History of perinephric hematoma.  Lower back pain. EXAM: CT ABDOMEN AND PELVIS WITHOUT CONTRAST TECHNIQUE: Multidetector CT imaging of the abdomen and pelvis was performed following the standard protocol without IV contrast. COMPARISON:  CT scan of Aug 19, 2018. FINDINGS: Lower chest: Minimal left pleural effusion is noted with adjacent subsegmental atelectasis. Hepatobiliary: Solitary gallstone is noted. No biliary dilatation is noted. No focal abnormality is noted in the patent parenchyma on these unenhanced images. Pancreas: Unremarkable. No pancreatic ductal dilatation or surrounding inflammatory changes. Spleen: Normal in size without focal abnormality. Adrenals/Urinary Tract: Adrenal glands are unremarkable. Severe polycystic kidney disease is again noted. The left kidney is markedly enlarged with multiple layering high density area is consistent with large perinephric hematoma. The kidney currently measures 24 by 20 x 16 cm which is slightly enlarged compared to prior exam. Urinary bladder is unremarkable. No definite hydronephrosis is noted. Stomach/Bowel: The stomach appears normal. The appendix appears normal. There is no evidence of bowel obstruction or inflammation. Sigmoid diverticulosis is noted without inflammation. Vascular/Lymphatic: Aortic atherosclerosis. No enlarged abdominal or pelvic lymph nodes. Reproductive: Prostate  is unremarkable. Other: Stable fat containing ventral hernia is noted in periumbilical region. Musculoskeletal: No acute or significant osseous findings. IMPRESSION: Findings  consistent with polycystic kidney disease. Continued presence of large left perinephric hematoma which may be slightly increased in size compared to prior exam. No definite hydronephrosis is noted currently. Sigmoid diverticulosis without inflammation. Cholelithiasis. Aortic Atherosclerosis (ICD10-I70.0). Electronically Signed   By: Marijo Conception M.D.   On: 08/26/2018 12:50   Ir Fluoro Guide Cv Line Right  Result Date: 08/27/2018 INDICATION: Poor venous access. In need of durable intravenous access for medication administration and blood draws while admitted to the hospital. Patient with end-stage renal disease, on dialysis, as such, request made for placement of a non tunneled central venous catheter. EXAM: ULTRASOUND AND FLUOROSCOPIC GUIDED NON TUNNELED CENTRAL VENOUS CATHETER INSERTION MEDICATIONS: None. CONTRAST:  None FLUOROSCOPY TIME:  3 minutes, 36 seconds (21.2 mGy) COMPLICATIONS: None immediate. TECHNIQUE: The procedure, risks, benefits, and alternatives were explained to the patient and informed written consent was obtained. A timeout was performed prior to the initiation of the procedure. The right chest and neck were prepped with chlorhexidine in a sterile fashion, and a sterile drape was applied covering the operative field. Maximum barrier sterile technique with sterile gowns and gloves were used for the procedure. A timeout was performed prior to the initiation of the procedure. Local anesthesia was provided with 1% lidocaine. Sonographic evaluation performed right neck and failed to delineate a patent right internal jugular vein, however a patent external jugular vein was identified. An ultrasound image was saved procedural documentation purposes. After the overlying soft tissues were anesthetized, a small venotomy incision was created and a micropuncture kit was utilized to access the right external jugular vein. Real-time ultrasound guidance was utilized for vascular access including the  acquisition of a permanent ultrasound image documenting patency of the accessed vessel. As there was some difficulty advancing the guidewire centrally, limited contrast injection was performed via the outer sheath of the micropuncture kit demonstrating tortuosity though patency of the SVC. A guidewire was advanced to the level of the superior caval-atrial junction for measurement purposes and the PICC line was cut to length. A peel-away sheath was placed and a 20 cm, 5 [place], dual lumen was inserted to level of the superior caval-atrial junction. A post procedure spot fluoroscopic was obtained. The catheter easily aspirated and flushed and was sutured in place. A dressing was placed. The patient tolerated the procedure well without immediate post procedural complication. FINDINGS: After catheter placement, the tip lies within the superior cavoatrial junction. The catheter aspirates and flushes normally and is ready for immediate use. IMPRESSION: Successful ultrasound and fluoroscopic guided placement of a right external jugular vein approach, 20 cm, 5 French, dual lumen PICC with tip at the superior caval-atrial junction. The PICC line is ready for immediate use. Electronically Signed   By: Sandi Mariscal M.D.   On: 08/27/2018 14:57   Ir US Guide Vasc Access Right  Result Date: 08/27/2018 INDICATION: Poor venous access. In need of durable intravenous access for medication administration and blood draws while admitted to the hospital. Patient with end-stage renal disease, on dialysis, as such, request made for placement of a non tunneled central venous catheter. EXAM: ULTRASOUND AND FLUOROSCOPIC GUIDED NON TUNNELED CENTRAL VENOUS CATHETER INSERTION MEDICATIONS: None. CONTRAST:  None FLUOROSCOPY TIME:  3 minutes, 36 seconds (24.8 mGy) COMPLICATIONS: None immediate. TECHNIQUE: The procedure, risks, benefits, and alternatives were explained to the patient and informed written consent was obtained.  A timeout was  performed prior to the initiation of the procedure. The right chest and neck were prepped with chlorhexidine in a sterile fashion, and a sterile drape was applied covering the operative field. Maximum barrier sterile technique with sterile gowns and gloves were used for the procedure. A timeout was performed prior to the initiation of the procedure. Local anesthesia was provided with 1% lidocaine. Sonographic evaluation performed right neck and failed to delineate a patent right internal jugular vein, however a patent external jugular vein was identified. An ultrasound image was saved procedural documentation purposes. After the overlying soft tissues were anesthetized, a small venotomy incision was created and a micropuncture kit was utilized to access the right external jugular vein. Real-time ultrasound guidance was utilized for vascular access including the acquisition of a permanent ultrasound image documenting patency of the accessed vessel. As there was some difficulty advancing the guidewire centrally, limited contrast injection was performed via the outer sheath of the micropuncture kit demonstrating tortuosity though patency of the SVC. A guidewire was advanced to the level of the superior caval-atrial junction for measurement purposes and the PICC line was cut to length. A peel-away sheath was placed and a 20 cm, 5 [place], dual lumen was inserted to level of the superior caval-atrial junction. A post procedure spot fluoroscopic was obtained. The catheter easily aspirated and flushed and was sutured in place. A dressing was placed. The patient tolerated the procedure well without immediate post procedural complication. FINDINGS: After catheter placement, the tip lies within the superior cavoatrial junction. The catheter aspirates and flushes normally and is ready for immediate use. IMPRESSION: Successful ultrasound and fluoroscopic guided placement of a right external jugular vein approach, 20 cm, 5  French, dual lumen PICC with tip at the superior caval-atrial junction. The PICC line is ready for immediate use. Electronically Signed   By: Sandi Mariscal M.D.   On: 08/27/2018 14:57   Medications:  . sodium chloride   Intravenous Once  . acetaminophen  650 mg Oral Q6H  . amiodarone  200 mg Oral Daily  . atorvastatin  80 mg Oral q1800  . darbepoetin (ARANESP) injection - DIALYSIS  100 mcg Intravenous Q Tue-HD  . feeding supplement (NEPRO CARB STEADY)  237 mL Oral TID BM  . feeding supplement (PRO-STAT SUGAR FREE 64)  30 mL Oral BID  . lanthanum  1,000 mg Oral TID PC  . midodrine  15 mg Oral TID WC  . multivitamin  1 tablet Oral QHS  . sodium chloride flush  10-40 mL Intracatheter Q12H    Dialysis Orders: TTS, [facility], 4 hours 45 minutes, left forearm AVF(15G), BFR 450, DFR 800, 2K/2.5 calcium. HeparinNONE. EDW 133.5kg. Hectorol2.71mg TIW, [place] 2.529mTIW/ epo 6600  Assessment/Plan: [age]ear old male patient with end-stage renal disease due to polycystic kidney disease who presents with a left perinephric hematoma. His hemoglobin was 6.4 on admission. He is status post 4 units of packed red blood cells.  1. Recurrent left perinephric hematoma. Status post 4 units of PRBCs. Evaluated by IR and urology, Ongoing lack of response to transfusions even though anticoag off now.  Noted Urology yest AM "possibility for embolization discussed further with patient and with VIR. VIR will see and evaluate patient to consider proceeding with embolization this week ". CT scan yesterday demonstrated no clear source of acute bleeding.  . 2. End-stage renal disease on hemodialysis. Continue Tuesday, Thursday, Saturday dialysis schedule. Currently dialyzing without issue.  3. Chronic hypotension. Blood pressures remain on  the low side. Continue midodrine.  4. AVR. was On Coumadin ,INR 1.7 / plan per admit team  - off all anticoag at this time.   5. Anemia. Secondary to  end-stage renal disease and acute blood loss. He is now status post 4 units of packed RBCs with plans for 2 more today. Continue Aranesp with dialysis. Ferritin is stable.  6. Secondary hyperparathyroidism. Hectorol on hold with ca >10 . On Fosrenol for phosphorus of 5.9 5/12.  Cont to monitor in light of poor po intake.  .  7. Nutrition. Continue protein supplementation when possible   Jannifer Hick MD 08/28/2018, 8:29 AM  [facility] Pager: [phone]

## 2018-08-28 NOTE — Progress Notes (Signed)
  Date: 08/28/2018  Patient name: [name] [name]  Medical record number: [id number]  Date of birth: [birth year]   I have seen and evaluated this patient and I have discussed the plan of care with the house staff. Please see their note for complete details. I concur with their findings with the following additions/corrections:   Unfortunately, hemoglobin has dropped again today.  Getting 1 unit of PRBCs with HD.  Interventional radiology planning embolization tomorrow, would like INR less than 1.5.  Will give vitamin K today and recheck, if needed could give FFP before the procedure.  Appetite and intake remains poor.  There are no great options for intervening on this, may need to consider NG tube.  We will discuss with him after the procedure tomorrow.  Suspect oral intake will remain quite limited due to mass-effect of his large left perinephric abscess on his stomach.  Lenice Pressman, M.D., Ph.D. 08/28/2018, 3:06 PM

## 2018-08-28 NOTE — Care Management Important Message (Signed)
Important Message  Patient Details  Name: [name] [name] MRN: [id number] Date of Birth: 1965/10/07   Medicare Important Message Given:  Yes    Kewon Statler 08/28/2018, 10:30 AM

## 2018-08-28 NOTE — Progress Notes (Signed)
   Subjective: [name] [name] hemoglobin dropped to 6.8 this morning and the night team ordered another unit of blood. This morning he is seen in HD. He states his pain is much improved today. He is eager to get the embolization procedure over with because he frustrated by his ongoing blood loss. All questions addressed.  Objective:  Vital signs in last 24 hours: Vitals:   08/27/18 1427 08/27/18 1829 08/27/18 2058 08/28/18 0523  BP: 109/80 119/70 94/69 97/65   Pulse: 98 97 98 94  Resp: 18 18 17 16   Temp: 98.3 F (36.8 C) 98 F (36.7 C) 97.9 F (36.6 C) 97.9 F (36.6 C)  TempSrc: Oral Oral Oral Oral  SpO2: 100% 100% 99% 96%  Weight:   135.2 kg   Height:       Gen: alert and oriented, no distress CV: RRR, mechnical AVR click Pulm: CTA in the anterior lung fields Abd: bs+, soft, non-distended, non-tender Ext: no edema  Assessment/Plan:  Principal Problem:   Perinephric hematoma Active Problems:   ESRD (end stage renal disease) (HCC)   Morbid obesity (HCC)   Systolic CHF (HCC)   S/P aortic valve replacement with bileaflet mechanical valve   Acute blood loss anemia   Gross hematuria   Supratherapeutic INR  [name] [name] is a 53 yo man with CAD s/p CABG, CHF, ESRD on HD, polycystic kidney disease, and aortic valve replacement on Coumadin who presented withabdominalpain and bleeding from the penissecondary to an expanding left-sided perinephric hematoma from PCKD and supratherapeutic INR.Outside hospital CT demonstrated persistent and mildly increased hematoma,likely expanded from supratherapeutic INR.  L perinephric hematoma:  AVR on Coumadin Has requiredsix blood transfusions since admission, will need a 7th today. INR paradoxically increased again today from 1.7 to 2.3 despite holding warfarin. May be 2/2 Vitamin K deficiency. As he continues to have bleeding with therapeutic/subtherapeutic INR, urology and IR are planning for embolization with IR tomorrow. He received a  non-tunneled cath with IR yesterday and had a repeat CT abdomen pelvis with contrast consistent with previous scans.  - Urology following, appreciate recs - IRfollowing, appreciate recs - Will give small dose of Vitamin K today to lower INR in setting of bleeding and upcoming procedure -Hold warfarin, trend INR -Pain control: PO dilaudid 1mg q6hrs prnwith IVfentanyl q6hrs for breakthrough - Trend CBC  ESRD on HD TTS -HD per nephrology - Continue home fosrenol - Continue ESA per nephro for iron deficiency anemia in the setting of acute blood loss and CKD - Trend BMP   Hypotension:Chronic hypotension with MAPs 60-65.Asymptomatic.Blood pressurestable.Will fluid bolus and recheck Hb if pressures worsenor patient becomes symptomatic. - Continue home midodrine 15mg  TID -Discontinued homemetoprolol  Leukocytosis: Continues to have stable leukocytosis.No obvious signs of infection. May be caused bystress reaction orresorption of known hematoma.  -Trend CBC   Dispo: Anticipated discharge once Hb stabilizes.  [name] [name], [name] Elk, MD 54/03/2019, 6:27 AM Pager: [phone]

## 2018-08-28 NOTE — Progress Notes (Signed)
Patient ID: BALRAJ BRAYFIELD, male   DOB: [birth year], 53 y.o.   MRN: [id number]    Subjective: Improved left flank pain and abdominal pain today. Resolution of blood per urethra. Received 1U pRBCs yesterday and hemoglobin decreased to 6.8 today. INR 2.3 despite no warfarin since admission. Underwent CT scan yesterday per VIR for planning purposes which demonstrated continued enlargement of left perinephric hematoma.    Objective: Vital signs in last 24 hours: Temp:  [97.6 F (36.4 C)-98.9 F (37.2 C)] 97.6 F (36.4 C) (05/12 9678) Pulse Rate:  [91-102] (P) 96 (05/12 0830) Resp:  [16-21] 21 (05/12 0638) BP: (81-119)/(48-80) (P) 84/55 (05/12 0830) SpO2:  [96 %-100 %] 96 % (05/12 0523) Weight:  [135.2 kg] 135.2 kg (05/12 0638)  Intake/Output from previous day: 05/11 0701 - 05/12 0700 In: 955 [P.O.:640; Blood:315] Out: 0  Intake/Output this shift: No intake/output data recorded.  Physical Exam:  General: Alert and oriented Abd: Moderate left CVAT and left palpable kidney TTP  Lab Results: Recent Labs    08/27/18 0639 08/27/18 1637 08/28/18 0326  HGB 6.8* 7.2* 6.8*  HCT 21.9* 22.2* 21.1*   BMET Recent Labs    08/28/18 0326  NA 133*  K 4.2  CL 91*  CO2 26  GLUCOSE 104*  BUN 65*  CREATININE 7.78*  CALCIUM 8.1*     Studies/Results: Ct Abdomen Pelvis W Wo Contrast  Result Date: 08/27/2018 CLINICAL DATA:  History end-stage renal disease secondary to polycystic kidney disease now with enlarging left-sided renal hemorrhage EXAM: CT ABDOMEN AND PELVIS WITHOUT AND WITH CONTRAST TECHNIQUE: Multidetector CT imaging of the abdomen and pelvis was performed following the standard protocol before and following the bolus administration of intravenous contrast. CONTRAST:  154m OMNIPAQUE IOHEXOL 300 MG/ML  SOLN COMPARISON:  08/26/2018; 08/19/2018; 08/04/2018; 08/01/2018; 07/23/2018 FINDINGS: Lower chest: Limited visualization lower thorax demonstrates trace left-sided pleural effusion  with associated dependent subpleural ground-glass atelectasis. Normal heart size. Coronary artery calcifications. Post median sternotomy and aortic valve replacement. No pericardial effusion. Hepatobiliary: Normal hepatic contour. No discrete hepatic lesions. Unchanged approximately 0.8 cm stone lying dependently within the lumen of otherwise normal-appearing gallbladder. No gallbladder wall thickening or pericholecystic fluid. No intrahepatic biliary duct dilatation. No ascites. Pancreas: Normal appearance of the pancreas. Spleen: Subcentimeter hypoattenuating lesion within the hilum of the spleen is too small to adequately characterize though may represent a splenic cyst. Adrenals/Urinary Tract: Re-demonstrated sequela of advanced polycystic kidney disease. Again, the left kidney is near completely replaced with multiple hemorrhagic cysts with multiple hematocrit levels demonstrated on the precontrast images (representative images 26, 37 and 57, series 3). The hemorrhagic left kidney has continued to enlarge, now measuring approximately 20.4 x 17.7 x 23.7 cm (axial image 63, series 10; coronal image 46, series 13), previously, 18.3 x 16.3 x 21.9 cm (compared to the 08/19/2018), now also with slight extension to involve the caudal aspect of left retroperitoneal space (image 111, series 15) but without definitive intraperitoneal extension. Ill-defined punctate (subcentimeter) areas of vessel irregularity are seen involving the anterior (axial image 81, series 10; coronal image 27, series 13) and posteromedial aspect (axial image 89, series 10; coronal image 58, series 13) of the left kidney without sizable area of contrast extravasation. No discrete underlying hyperenhancing left-sided renal lesion. No evidence of right-sided renal hemorrhage. Normal appearance of the bilateral adrenal glands. Normal appearance of the urinary bladder given degree of distention. Stomach/Bowel: Rather extensive colonic diverticulosis  without evidence of superimposed acute diverticulitis. No pneumoperitoneum, pneumatosis or portal  venous gas. Vascular/Lymphatic: Atherosclerotic plaque within a normal caliber abdominal aorta. Note is made of a solitary left renal artery. While the left renal artery appears somewhat diminutive does appear patent and again, there are no sizable areas of contrast extravasation to suggest acute arterial extravasation. Reproductive: Normal appearance the prostate gland. No free fluid the pelvic cul-de-sac. Other: Diffuse body wall anasarca. Note is made of a approximately 5.9 x 3.6 cm mesenteric fat containing periumbilical hernia. Musculoskeletal: No acute or aggressive osseous abnormalities. Moderate severe multilevel lumbar spine DDD. Stigmata of DISH within the caudal aspect of the thoracic spine. IMPRESSION: 1. Sequela of advanced polycystic kidney disease with continued enlargement of complex intraparenchymal and perinephric hemorrhage replacing the entirety of the left kidney without discrete area of contrast extravasation or hyperenhancing underlying renal lesion. 2. Colonic diverticulosis without evidence of superimposed acute diverticulitis. 3. Cholelithiasis without evidence of cholecystitis. 4.  Aortic Atherosclerosis (ICD10-I70.0). Electronically Signed   By: Sandi Mariscal M.D.   On: 08/27/2018 16:23   Ct Abdomen Pelvis Wo Contrast  Result Date: 08/26/2018 CLINICAL DATA:  History of perinephric hematoma.  Lower back pain. EXAM: CT ABDOMEN AND PELVIS WITHOUT CONTRAST TECHNIQUE: Multidetector CT imaging of the abdomen and pelvis was performed following the standard protocol without IV contrast. COMPARISON:  CT scan of Aug 19, 2018. FINDINGS: Lower chest: Minimal left pleural effusion is noted with adjacent subsegmental atelectasis. Hepatobiliary: Solitary gallstone is noted. No biliary dilatation is noted. No focal abnormality is noted in the patent parenchyma on these unenhanced images. Pancreas:  Unremarkable. No pancreatic ductal dilatation or surrounding inflammatory changes. Spleen: Normal in size without focal abnormality. Adrenals/Urinary Tract: Adrenal glands are unremarkable. Severe polycystic kidney disease is again noted. The left kidney is markedly enlarged with multiple layering high density area is consistent with large perinephric hematoma. The kidney currently measures 24 by 20 x 16 cm which is slightly enlarged compared to prior exam. Urinary bladder is unremarkable. No definite hydronephrosis is noted. Stomach/Bowel: The stomach appears normal. The appendix appears normal. There is no evidence of bowel obstruction or inflammation. Sigmoid diverticulosis is noted without inflammation. Vascular/Lymphatic: Aortic atherosclerosis. No enlarged abdominal or pelvic lymph nodes. Reproductive: Prostate is unremarkable. Other: Stable fat containing ventral hernia is noted in periumbilical region. Musculoskeletal: No acute or significant osseous findings. IMPRESSION: Findings consistent with polycystic kidney disease. Continued presence of large left perinephric hematoma which may be slightly increased in size compared to prior exam. No definite hydronephrosis is noted currently. Sigmoid diverticulosis without inflammation. Cholelithiasis. Aortic Atherosclerosis (ICD10-I70.0). Electronically Signed   By: Marijo Conception M.D.   On: 08/26/2018 12:50   Ir Fluoro Guide Cv Line Right  Result Date: 08/27/2018 INDICATION: Poor venous access. In need of durable intravenous access for medication administration and blood draws while admitted to the hospital. Patient with end-stage renal disease, on dialysis, as such, request made for placement of a non tunneled central venous catheter. EXAM: ULTRASOUND AND FLUOROSCOPIC GUIDED NON TUNNELED CENTRAL VENOUS CATHETER INSERTION MEDICATIONS: None. CONTRAST:  None FLUOROSCOPY TIME:  3 minutes, 36 seconds (32.9 mGy) COMPLICATIONS: None immediate. TECHNIQUE: The  procedure, risks, benefits, and alternatives were explained to the patient and informed written consent was obtained. A timeout was performed prior to the initiation of the procedure. The right chest and neck were prepped with chlorhexidine in a sterile fashion, and a sterile drape was applied covering the operative field. Maximum barrier sterile technique with sterile gowns and gloves were used for the procedure. A timeout was performed  prior to the initiation of the procedure. Local anesthesia was provided with 1% lidocaine. Sonographic evaluation performed right neck and failed to delineate a patent right internal jugular vein, however a patent external jugular vein was identified. An ultrasound image was saved procedural documentation purposes. After the overlying soft tissues were anesthetized, a small venotomy incision was created and a micropuncture kit was utilized to access the right external jugular vein. Real-time ultrasound guidance was utilized for vascular access including the acquisition of a permanent ultrasound image documenting patency of the accessed vessel. As there was some difficulty advancing the guidewire centrally, limited contrast injection was performed via the outer sheath of the micropuncture kit demonstrating tortuosity though patency of the SVC. A guidewire was advanced to the level of the superior caval-atrial junction for measurement purposes and the PICC line was cut to length. A peel-away sheath was placed and a 20 cm, 5 [place], dual lumen was inserted to level of the superior caval-atrial junction. A post procedure spot fluoroscopic was obtained. The catheter easily aspirated and flushed and was sutured in place. A dressing was placed. The patient tolerated the procedure well without immediate post procedural complication. FINDINGS: After catheter placement, the tip lies within the superior cavoatrial junction. The catheter aspirates and flushes normally and is ready for immediate  use. IMPRESSION: Successful ultrasound and fluoroscopic guided placement of a right external jugular vein approach, 20 cm, 5 French, dual lumen PICC with tip at the superior caval-atrial junction. The PICC line is ready for immediate use. Electronically Signed   By: Sandi Mariscal M.D.   On: 08/27/2018 14:57   Ir US Guide Vasc Access Right  Result Date: 08/27/2018 INDICATION: Poor venous access. In need of durable intravenous access for medication administration and blood draws while admitted to the hospital. Patient with end-stage renal disease, on dialysis, as such, request made for placement of a non tunneled central venous catheter. EXAM: ULTRASOUND AND FLUOROSCOPIC GUIDED NON TUNNELED CENTRAL VENOUS CATHETER INSERTION MEDICATIONS: None. CONTRAST:  None FLUOROSCOPY TIME:  3 minutes, 36 seconds (38.1 mGy) COMPLICATIONS: None immediate. TECHNIQUE: The procedure, risks, benefits, and alternatives were explained to the patient and informed written consent was obtained. A timeout was performed prior to the initiation of the procedure. The right chest and neck were prepped with chlorhexidine in a sterile fashion, and a sterile drape was applied covering the operative field. Maximum barrier sterile technique with sterile gowns and gloves were used for the procedure. A timeout was performed prior to the initiation of the procedure. Local anesthesia was provided with 1% lidocaine. Sonographic evaluation performed right neck and failed to delineate a patent right internal jugular vein, however a patent external jugular vein was identified. An ultrasound image was saved procedural documentation purposes. After the overlying soft tissues were anesthetized, a small venotomy incision was created and a micropuncture kit was utilized to access the right external jugular vein. Real-time ultrasound guidance was utilized for vascular access including the acquisition of a permanent ultrasound image documenting patency of the  accessed vessel. As there was some difficulty advancing the guidewire centrally, limited contrast injection was performed via the outer sheath of the micropuncture kit demonstrating tortuosity though patency of the SVC. A guidewire was advanced to the level of the superior caval-atrial junction for measurement purposes and the PICC line was cut to length. A peel-away sheath was placed and a 20 cm, 5 [place], dual lumen was inserted to level of the superior caval-atrial junction. A post procedure spot fluoroscopic was  obtained. The catheter easily aspirated and flushed and was sutured in place. A dressing was placed. The patient tolerated the procedure well without immediate post procedural complication. FINDINGS: After catheter placement, the tip lies within the superior cavoatrial junction. The catheter aspirates and flushes normally and is ready for immediate use. IMPRESSION: Successful ultrasound and fluoroscopic guided placement of a right external jugular vein approach, 20 cm, 5 French, dual lumen PICC with tip at the superior caval-atrial junction. The PICC line is ready for immediate use. Electronically Signed   By: Sandi Mariscal M.D.   On: 08/27/2018 14:57    Assessment/Plan: 1) Left perinephric hematoma: Patient continues to require blood transfusions with decreased Hgb to 6.8 today. Given continued need for ongoing transfusions discussed embolization with patient and with VIR, who will likely plan for embolization tomorrow. Continue bedrest and continue serial Hgb and INR measurements. Recommend holding warfarin and continuing IV heparin for anticoagulation. Recommend correcting INR with vitamin K to keep < 1.5. Will continue to follow.    LOS: 9 days   Dorothey Baseman 08/28/2018, 8:55 AM

## 2018-08-28 NOTE — Progress Notes (Signed)
[place] for heparin/warfarin Indication: Mechanical valve  No Known Allergies  Patient Measurements: Height: 6\' 1"  (185.4 cm) Weight: 298 lb 1 oz (135.2 kg) IBW/kg (Calculated) : 79.9  Vital Signs: Temp: 97.9 F (36.6 C) (05/12 0523) Temp Source: Oral (05/12 9179) BP: 81/48 (05/12 0730) Pulse Rate: 95 (05/12 0730)  Labs: Recent Labs    08/26/18 1956 08/27/18 0639 08/27/18 1637 08/28/18 0326  HGB 7.5* 6.8* 7.2* 6.8*  HCT 23.6* 21.9* 22.2* 21.1*  PLT 321 311  --  291  LABPROT 19.2* 20.0*  --  24.8*  INR 1.6* 1.7*  --  2.3*  CREATININE  --   --   --  7.78*    Estimated Creatinine Clearance: 15.8 mL/min (A) (by C-G formula based on SCr of 7.78 mg/dL (H)).   Medical History: Past Medical History:  Diagnosis Date  . Aneurysm ([place])     Right arm fistula 3 aneurysms 2011,   plans to have a new procedure  . Angina   . Chest discomfort   . CHF (congestive heart failure) ([place])   . Coronary artery disease   . Coronary artery disease involving native coronary artery of native heart without angina pectoris   . Dialysis patient ([place])   . Dysrhythmia   . ESRD (end stage renal disease) ([place])    on hemodialysis [place]T_S  . Hypertension   . Leg pain   . Mitral regurgitation   . Morbid obesity ([place])   . Orthostatic hypotension   . Overweight(278.02)   . Renal insufficiency   . 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 Severe aortic insufficiency   . Sinus tachycardia   . Syncope     positional after dialysis... 2008   Assessment: 62 YOM on warfarin PTA for mech aortic valve with subtherapeutic INR started on heparin with no bolus and tight goals d/t bleed risk with current hematuria.   Last dose warfarin was on 5/2 and patient is s/p 5.5 mg Vitamin K. INR goal has been decreased to 2-2.5  5/12: Hgb  continues to trend down. INR trended back up to 2.3 despite redose of 0.5mg  Vitamin K on 5/10. Anticoagulation currently on hold.    Goal of Therapy:  Heparin level 0.3-0.5 units/ml Monitor platelets by anticoagulation protocol: Yes   Plan:  Holding heparin Holding warfarin  Daily INR and CBC  Clotee Schlicker A. Levada Dy, PharmD, [place] Please utilize Am[facility] for appropriate phone number to reach the unit pharmacist ([place])    08/28/2018 7:38 AM

## 2018-08-28 NOTE — Consult Note (Signed)
Chief Complaint: Patient was seen in consultation today for left renal arteriogram with embolization Chief Complaint  Patient presents with   Abdominal Pain   at the request of Dr Windle Guard  Referring Physician(s): Dr Deirdre Pippins  Supervising Physician: Sandi Mariscal  Patient Status: [facility] - In-pt  History of Present Illness: [name] [name] is a 53 y.o. male    ESRD; CHF; Ao valve replacement No Coumadin for several days INR 2.3 today Will need INR 1.5 or lower for procedure in IR planned for tomorrow  Left perinephric hematoma: Patient continues to require blood transfusions with decreased Hgb to 6.8 today. Given continued need for ongoing transfusions discussed embolization with patient and with VIR, who will likely plan for embolization tomorrow. Continue bedrest and continue serial Hgb and INR measurements.  IMPRESSION: CT yesterday 1. Sequela of advanced polycystic kidney disease with continued enlargement of complex intraparenchymal and perinephric hemorrhage replacing the entirety of the left kidney without discrete area of contrast extravasation or hyperenhancing underlying renal lesion.  Request for renal arteriogram with probable embolization in IR 5/13 Dr Pascal Lux has reviewed imaging and status with Dr Donette Larry procedure for tomorrow  Message to Dr Rebeca Alert for anticoagulation reversal-- need INR 1.5 or lower for IR procedure in am  Past Medical History:  Diagnosis Date   Aneurysm ([place])     Right arm fistula 3 aneurysms 2011,   plans to have a new procedure   Angina    Chest discomfort    CHF (congestive heart failure) ([place])    Coronary artery disease    Coronary artery disease involving native coronary artery of native heart without angina pectoris    Dialysis patient [facility])    Dysrhythmia    ESRD (end stage renal disease) ([place])    on hemodialysis T_T_S   Hypertension    Leg pain    Mitral regurgitation    Morbid obesity ([facility])     Orthostatic hypotension    Overweight(278.02)    Renal insufficiency    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 2008    Past Surgical History:  Procedure Laterality Date   A/V FISTULAGRAM Left 06/30/2016   Procedure: A/V Fistulagram;  Surgeon: Algernon Huxley, MD;  Location: [place] [facility];  Service: Cardiovascular;  Laterality: Left;   AORTIC VALVE REPLACEMENT N/A 09/01/2017   Procedure: AORTIC VALVE REPLACEMENT (AVR);  Surgeon: [name], MD;  Location: [place];  Service: Open Heart Surgery;  Laterality: N/A;   ARTERIOVENOUS GRAFT PLACEMENT     AV FISTULA PLACEMENT     AV FISTULA PLACEMENT Left 11/18/2015   Procedure: ARTERIOVENOUS (AV) FISTULA CREATION ( RADIOCEPHALIC );  Surgeon: Algernon Huxley, MD;  Location: [facility];  Service: Vascular;  Laterality: Left;   AV FISTULA PLACEMENT Left 03/23/2016   Procedure: ARTERIOVENOUS (AV) FISTULA CREATION ( REVISION );  Surgeon: Algernon Huxley, MD;  Location: [facility];  Service: Vascular;  Laterality: Left;   [place] REMOVAL Left 03/23/2016   Procedure: REMOVAL OF ARTERIOVENOUS GORETEX GRAFT ([place]);  Surgeon: Algernon Huxley, MD;  Location: [facility];  Service: Vascular;  Laterality: Left;   CENTRAL LINE INSERTION Right 09/01/2017   Procedure: FLOROSCOPY GUIDED PLACEMENT OF RIGHT FEMORAL  CENTRAL LINE TIMES TWO  AND PLACEMENT OF SWAN GANZ CATHETER;  Surgeon: [name], MD;  Location: [place];  Service: Open Heart Surgery;  Laterality: Right;   CORONARY ARTERY BYPASS GRAFT N/A 09/01/2017   Procedure: CORONARY ARTERY BYPASS GRAFTING (CABG) X 1, USING RIGHT GREATER SAPHENOUS VEIN HARVESTED ENDOSCOPICALLY;  Surgeon: [name], MD;  Location: [place];  Service: Open Heart Surgery;  Laterality: N/A;   DIALYSIS/PERMA  CATHETER REMOVAL N/A 08/01/2016   Procedure: Dialysis/Perma Catheter Removal;  Surgeon: Algernon Huxley, MD;  Location: [place] [facility];  Service: Cardiovascular;  Laterality: N/A;   HERNIA REPAIR     INSERTION OF DIALYSIS CATHETER  03/01/2011   Procedure: INSERTION OF DIALYSIS CATHETER;  Surgeon: Elam Dutch, MD;  Location: [place];  Service: Vascular;  Laterality: Left;  Exchange of Dialysis Catheter to 27cm 15Fr. Arrow Catheter   INSERTION OF DIALYSIS CATHETER  09/01/2017   Procedure: PLACEMENT OF TRIALYSIS SHORT TERM DIALYSIS CATHETER;  Surgeon: [name], MD;  Location: [facility] OR;  Service: Open Heart Surgery;;   IR FLUORO GUIDE CV LINE RIGHT  08/27/2018   IR US GUIDE VASC ACCESS RIGHT  08/27/2018   MULTIPLE EXTRACTIONS WITH ALVEOLOPLASTY N/A 08/07/2017   Procedure: Extraction of tooth #2, 0,1,02,72,53,66, and 31 with alveoloplasty and gross debridement of remaining teeth;  Surgeon: [name], DDS;  Location: [place];  Service: Oral Surgery;  Laterality: N/A;   PERIPHERAL VASCULAR CATHETERIZATION N/A 09/01/2014   Procedure: A/V Shuntogram/Fistulagram;  Surgeon: Algernon Huxley, MD;  Location: [place] [facility];  Service: Cardiovascular;  Laterality: N/A;   PERIPHERAL VASCULAR CATHETERIZATION Right 09/01/2014   Procedure: Thrombectomy;  Surgeon: Algernon Huxley, MD;  Location: [place] [facility];  Service: Cardiovascular;  Laterality: Right;   PERIPHERAL VASCULAR CATHETERIZATION Right 09/01/2014   Procedure: A/V Shunt Intervention;  Surgeon: Algernon Huxley, MD;  Location: [place] [facility];  Service: Cardiovascular;  Laterality: Right;   PERIPHERAL VASCULAR CATHETERIZATION N/A 09/17/2014   Procedure: A/V Shuntogram/Fistulagram;  Surgeon: Algernon Huxley, MD;  Location: [place] [facility];  Service: Cardiovascular;  Laterality: N/A;   PERIPHERAL VASCULAR CATHETERIZATION Right 10/17/2014   Procedure: Thrombectomy;  Surgeon: Katha [place]al, MD;  Location: [place] [facility];   Service: Cardiovascular;  Laterality: Right;   PERIPHERAL VASCULAR CATHETERIZATION N/A 10/17/2014   Procedure: A/V Shuntogram/Fistulagram;  Surgeon: Katha [place]al, MD;  Location: [place] [facility];  Service: Cardiovascular;  Laterality: N/A;   PERIPHERAL VASCULAR CATHETERIZATION N/A 10/17/2014   Procedure: A/V Shunt Intervention;  Surgeon: Katha [place]al, MD;  Location: [place] [facility];  Service: Cardiovascular;  Laterality: N/A;   PERIPHERAL VASCULAR CATHETERIZATION Right 11/04/2014   Procedure: Thrombectomy;  Surgeon: Katha [place]al, MD;  Location: [place] [facility];  Service: Cardiovascular;  Laterality: Right;   PERIPHERAL VASCULAR CATHETERIZATION Left 11/04/2014   Procedure: Visceral Venography;  Surgeon: Katha [place]al, MD;  Location: [place] [facility];  Service: Cardiovascular;  Laterality: Left;   PERIPHERAL VASCULAR CATHETERIZATION Right 11/27/2014   Procedure: A/V Shuntogram/Fistulagram;  Surgeon: Algernon Huxley, MD;  Location: [place] [facility];  Service: Cardiovascular;  Laterality: Right;   PERIPHERAL VASCULAR CATHETERIZATION Right 11/27/2014   Procedure: A/V Shunt Intervention;  Surgeon: Algernon Huxley, MD;  Location: [place] [facility];  Service: Cardiovascular;  Laterality: Right;   PERIPHERAL VASCULAR CATHETERIZATION Right 12/31/2014   Procedure: Thrombectomy;  Surgeon: Algernon Huxley, MD;  Location: [facility]  [facility];  Service: Cardiovascular;  Laterality: Right;   PERIPHERAL VASCULAR CATHETERIZATION Right 01/12/2015   Procedure: A/V Shuntogram/Fistulagram;  Surgeon: Algernon Huxley, MD;  Location: [place] [facility];  Service: Cardiovascular;  Laterality: Right;   PERIPHERAL VASCULAR CATHETERIZATION N/A 01/12/2015   Procedure: A/V Shunt Intervention;  Surgeon: Algernon Huxley, MD;  Location: [place] [facility];  Service: Cardiovascular;  Laterality: N/A;   PERIPHERAL VASCULAR CATHETERIZATION Right 01/28/2015   Procedure: Thrombectomy;  Surgeon: Algernon Huxley,  MD;  Location: [place] [facility];  Service: Cardiovascular;  Laterality: Right;   PERIPHERAL VASCULAR CATHETERIZATION N/A 06/08/2015   Procedure: A/V Shuntogram/Fistulagram;  Surgeon: Algernon Huxley, MD;  Location: [place] [facility];  Service: Cardiovascular;  Laterality: N/A;   PERIPHERAL VASCULAR CATHETERIZATION N/A 06/08/2015   Procedure: A/V Shunt Intervention;  Surgeon: Algernon Huxley, MD;  Location: [place] [facility];  Service: Cardiovascular;  Laterality: N/A;   PERIPHERAL VASCULAR CATHETERIZATION Right 07/06/2015   Procedure: A/V Shuntogram/Fistulagram;  Surgeon: Algernon Huxley, MD;  Location: [place] [facility];  Service: Cardiovascular;  Laterality: Right;   PERIPHERAL VASCULAR CATHETERIZATION N/A 07/06/2015   Procedure: A/V Shunt Intervention;  Surgeon: Algernon Huxley, MD;  Location: [place] [facility];  Service: Cardiovascular;  Laterality: N/A;   PERIPHERAL VASCULAR CATHETERIZATION N/A 08/04/2015   Procedure: graft declot;  Surgeon: Katha [place]al, MD;  Location: [place] [facility];  Service: Cardiovascular;  Laterality: N/A;   PERIPHERAL VASCULAR CATHETERIZATION Right 08/25/2015   Procedure: A/V Shuntogram/Fistulagram;  Surgeon: Katha [place]al, MD;  Location: [place] [facility];  Service: Cardiovascular;  Laterality: Right;   PERIPHERAL VASCULAR CATHETERIZATION N/A 11/04/2015   Procedure: Dialysis/Perma Catheter Insertion;  Surgeon: Algernon Huxley, MD;  Location: [place] [facility];  Service: Cardiovascular;  Laterality: N/A;   PERIPHERAL VASCULAR CATHETERIZATION Left 12/14/2015   Procedure: A/V Shuntogram/Fistulagram;  Surgeon: Algernon Huxley, MD;  Location: [place] [facility];  Service: Cardiovascular;  Laterality: Left;   PERIPHERAL VASCULAR CATHETERIZATION N/A 12/14/2015   Procedure: A/V Shunt Intervention;  Surgeon: Algernon Huxley, MD;  Location: [place] [facility];  Service: Cardiovascular;  Laterality: N/A;   PERIPHERAL VASCULAR CATHETERIZATION N/A 12/17/2015    Procedure: Dialysis/Perma Catheter Insertion;  Surgeon: Algernon Huxley, MD;  Location: [place] [facility];  Service: Cardiovascular;  Laterality: N/A;   PERIPHERAL VASCULAR CATHETERIZATION Left 12/22/2015   Procedure: Dialysis/Perma Catheter Insertion;  Surgeon: Katha [place]al, MD;  Location: [place] [facility];  Service: Cardiovascular;  Laterality: Left;   PERIPHERAL VASCULAR CATHETERIZATION Left 02/29/2016   Procedure: A/V Shuntogram/Fistulagram;  Surgeon: Algernon Huxley, MD;  Location: [place] [facility];  Service: Cardiovascular;  Laterality: Left;   PERIPHERAL VASCULAR CATHETERIZATION N/A 02/29/2016   Procedure: A/V Shunt Intervention;  Surgeon: Algernon Huxley, MD;  Location: [place] [facility];  Service: Cardiovascular;  Laterality: N/A;   POCKET REVISION/RELOCATION N/A 01/01/2018   Procedure: POCKET REVISION/RELOCATION;  Surgeon: [place] Lance, MD;  Location: [place] [facility];  Service: Cardiovascular;  Laterality: N/A;   right arm graft     for dyalisis   RIGHT/LEFT HEART CATH AND CORONARY ANGIOGRAPHY N/A 08/02/2017   Procedure: RIGHT/LEFT HEART CATH AND CORONARY ANGIOGRAPHY;  Surgeon: Larey Dresser, MD;  Location: [place] [facility];  Service: Cardiovascular;  Laterality: N/A;   SUBQ ICD IMPLANT N/A 11/28/2017   Procedure: SUBQ ICD IMPLANT;  Surgeon: [place] Lance, MD;  Location: [place] [facility];  Service:  Cardiovascular;  Laterality: N/A;   SUBQ ICD REVISION N/A 02/12/2018   Procedure: SUBQ ICD REVISION(REMOVAL);  Surgeon: [place] Lance, MD;  Location: [place] [facility];  Service: Cardiovascular;  Laterality: N/A;   TEE WITHOUT CARDIOVERSION N/A 07/31/2017   Procedure: TRANSESOPHAGEAL ECHOCARDIOGRAM (TEE);  Surgeon: Josue Hector, MD;  Location: [facility];  Service: Cardiovascular;  Laterality: N/A;   TEE WITHOUT CARDIOVERSION N/A 09/01/2017   Procedure: TRANSESOPHAGEAL ECHOCARDIOGRAM (TEE);  Surgeon: [name], MD;  Location: [place];  Service: Open Heart  Surgery;  Laterality: N/A;   THROMBECTOMY     THROMBECTOMY W/ EMBOLECTOMY  03/01/2011   Procedure: THROMBECTOMY ARTERIOVENOUS GORE-TEX GRAFT;  Surgeon: Elam Dutch, MD;  Location: [place];  Service: Vascular;  Laterality: Right;  Attempted Thrombectomy of Old  Right Upper Arm Arteriovenous gortex Graft. Insertion of new Arteriovenous Graft using 57m x 50cm Gortex Stretch graft.    THROMBECTOMY W/ EMBOLECTOMY  07/11/2011   Procedure: THROMBECTOMY ARTERIOVENOUS GORE-TEX GRAFT;  Surgeon: TRosetta Posner MD;  [facility];  Service: Vascular;  Laterality: Right;   UMBILICAL HERNIA REPAIR     VENOGRAM N/A 08/23/2011   Procedure: VENOGRAM;  Surgeon: VSerafina Mitchell MD;  [facility];  Service: Cardiovascular;  Laterality: N/A;    Allergies: Patient has no known allergies.  Medications: Prior to Admission medications   Medication Sig Start Date End Date Taking? Authorizing Provider  amiodarone (PACERONE) 200 MG tablet Take 1 tablet (200 mg total) by mouth daily. Patient taking differently: Take 200 mg by mouth at bedtime.  01/16/18  Yes TEv[place] Lance MD  atorvastatin (LIPITOR) 80 MG tablet Take 1 tablet (80 mg total) by mouth daily at 6 PM. Patient taking differently: Take 80 mg by mouth daily.  10/31/17  Yes MLarey Dresser MD  Cinacalcet HCl (SENSIPAR PO) Take 1 tablet by mouth daily.   Yes [provider]  lanthanum (FOSRENOL) 500 MG chewable tablet Chew 500-1,000 mg by mouth See admin instructions. Take 2 tablets (1000 mg) by mouth three times daily with meals, take 1 tablet (500 mg) with snacks   Yes [provider]  midodrine (PROAMATINE) 5 MG tablet Take 3 tablets (15 mg total) by mouth 3 (three) times daily with meals. Patient taking differently: Take 5 mg by mouth daily.  01/09/18  Yes TEv[place] Lance MD  warfarin (COUMADIN) 5 MG tablet Take 1 tablet daily except 1/2 tablet on Wednesdays and Sundays or as directed by coumadin clinic Patient taking  differently: Take 2.5-5 mg by mouth See admin instructions. Take 1/2 tablet (2.5 mg) by mouth on Sunday and Tuesday nights, take 1 tablet (5 mg) on Monday, Wednesday, Thursday, Friday, Saturday nights or as directed by coumadin clinic 08/07/18  Yes KAsencion Noble MD     Family History  Problem Relation Age of Onset   Hypertension Mother    Heart disease Mother    Hypertension Father    Heart disease Father     Social History   Socioeconomic History   Marital status: Married    Spouse name: Not on file   Number of children: Not on file   Years of education: Not on file   Highest education level: Not on file  Occupational History   Occupation: disabled  Social Needs   Financial resource strain: Not on file   Food insecurity:    Worry: Not on file    Inability: Not on file   Transportation needs:  Medical: Not on file    Non-medical: Not on file  Tobacco Use   Smoking status: Former Smoker    Types: Cigarettes    Last attempt to quit: 04/18/1994    Years since quitting: 24.3   Smokeless tobacco: Current User    Types: Snuff   Tobacco comment: dips 1/2 snuff per day times 25 years  Substance and Sexual Activity   Alcohol use: No   Drug use: No   Sexual activity: Not on file  Lifestyle   Physical activity:    Days per week: Not on file    Minutes per session: Not on file   Stress: Not on file  Relationships   Social connections:    Talks on phone: Not on file    Gets together: Not on file    Attends religious service: Not on file    Active member of club or organization: Not on file    Attends meetings of clubs or organizations: Not on file    Relationship status: Not on file  Other Topics Concern   Not on file  Social History Narrative   Not on file     Review of Systems: A 12 point ROS discussed and pertinent positives are indicated in the HPI above.  All other systems are negative.  Review of Systems  Constitutional: Positive  for activity change and fatigue. Negative for appetite change and fever.  Respiratory: Negative for cough and shortness of breath.   Cardiovascular: Negative for chest pain.  Gastrointestinal: Positive for abdominal pain.  Genitourinary: Positive for flank pain.  Musculoskeletal: Negative for back pain.  Neurological: Positive for weakness.  Psychiatric/Behavioral: Negative for behavioral problems and confusion.    Vital Signs: BP 104/64 (BP Location: Right Arm)    Pulse 95    Temp 98.2 F (36.8 C) (Oral)    Resp 18    Ht _0  (1.854 m)    Wt 298 lb 1 oz (135.2 kg)    SpO2 99%    BMI 39.32 kg/m   Physical Exam Vitals signs reviewed.  Cardiovascular:     Rate and Rhythm: Regular rhythm.     Heart sounds: No murmur.  Pulmonary:     Effort: Pulmonary effort is normal.     Breath sounds: Normal breath sounds.  Abdominal:     General: Bowel sounds are normal. There is no distension.     Palpations: Abdomen is soft.     Tenderness: There is no abdominal tenderness.  Skin:    General: Skin is warm and dry.  Neurological:     General: No focal deficit present.     Mental Status: He is alert and oriented to person, place, and time.  Psychiatric:        Behavior: Behavior normal.     Imaging: Ct Abdomen Pelvis W Wo Contrast  Result Date: 08/27/2018 CLINICAL DATA:  History end-stage renal disease secondary to polycystic kidney disease now with enlarging left-sided renal hemorrhage EXAM: CT ABDOMEN AND PELVIS WITHOUT AND WITH CONTRAST TECHNIQUE: Multidetector CT imaging of the abdomen and pelvis was performed following the standard protocol before and following the bolus administration of intravenous contrast. CONTRAST:  121m OMNIPAQUE IOHEXOL 300 MG/ML  SOLN COMPARISON:  08/26/2018; 08/19/2018; 08/04/2018; 08/01/2018; 07/23/2018 FINDINGS: Lower chest: Limited visualization lower thorax demonstrates trace left-sided pleural effusion with associated dependent subpleural ground-glass  atelectasis. Normal heart size. Coronary artery calcifications. Post median sternotomy and aortic valve replacement. No pericardial effusion. Hepatobiliary: Normal hepatic contour. No  discrete hepatic lesions. Unchanged approximately 0.8 cm stone lying dependently within the lumen of otherwise normal-appearing gallbladder. No gallbladder wall thickening or pericholecystic fluid. No intrahepatic biliary duct dilatation. No ascites. Pancreas: Normal appearance of the pancreas. Spleen: Subcentimeter hypoattenuating lesion within the hilum of the spleen is too small to adequately characterize though may represent a splenic cyst. Adrenals/Urinary Tract: Re-demonstrated sequela of advanced polycystic kidney disease. Again, the left kidney is near completely replaced with multiple hemorrhagic cysts with multiple hematocrit levels demonstrated on the precontrast images (representative images 26, 37 and 57, series 3). The hemorrhagic left kidney has continued to enlarge, now measuring approximately 20.4 x 17.7 x 23.7 cm (axial image 63, series 10; coronal image 46, series 13), previously, 18.3 x 16.3 x 21.9 cm (compared to the 08/19/2018), now also with slight extension to involve the caudal aspect of left retroperitoneal space (image 111, series 15) but without definitive intraperitoneal extension. Ill-defined punctate (subcentimeter) areas of vessel irregularity are seen involving the anterior (axial image 81, series 10; coronal image 27, series 13) and posteromedial aspect (axial image 89, series 10; coronal image 58, series 13) of the left kidney without sizable area of contrast extravasation. No discrete underlying hyperenhancing left-sided renal lesion. No evidence of right-sided renal hemorrhage. Normal appearance of the bilateral adrenal glands. Normal appearance of the urinary bladder given degree of distention. Stomach/Bowel: Rather extensive colonic diverticulosis without evidence of superimposed acute  diverticulitis. No pneumoperitoneum, pneumatosis or portal venous gas. Vascular/Lymphatic: Atherosclerotic plaque within a normal caliber abdominal aorta. Note is made of a solitary left renal artery. While the left renal artery appears somewhat diminutive does appear patent and again, there are no sizable areas of contrast extravasation to suggest acute arterial extravasation. Reproductive: Normal appearance the prostate gland. No free fluid the pelvic cul-de-sac. Other: Diffuse body wall anasarca. Note is made of a approximately 5.9 x 3.6 cm mesenteric fat containing periumbilical hernia. Musculoskeletal: No acute or aggressive osseous abnormalities. Moderate severe multilevel lumbar spine DDD. Stigmata of DISH within the caudal aspect of the thoracic spine. IMPRESSION: 1. Sequela of advanced polycystic kidney disease with continued enlargement of complex intraparenchymal and perinephric hemorrhage replacing the entirety of the left kidney without discrete area of contrast extravasation or hyperenhancing underlying renal lesion. 2. Colonic diverticulosis without evidence of superimposed acute diverticulitis. 3. Cholelithiasis without evidence of cholecystitis. 4.  Aortic Atherosclerosis (ICD10-I70.0). Electronically Signed   By: Sandi Mariscal M.D.   On: 08/27/2018 16:23   Ct Abdomen Pelvis Wo Contrast  Result Date: 08/26/2018 CLINICAL DATA:  History of perinephric hematoma.  Lower back pain. EXAM: CT ABDOMEN AND PELVIS WITHOUT CONTRAST TECHNIQUE: Multidetector CT imaging of the abdomen and pelvis was performed following the standard protocol without IV contrast. COMPARISON:  CT scan of Aug 19, 2018. FINDINGS: Lower chest: Minimal left pleural effusion is noted with adjacent subsegmental atelectasis. Hepatobiliary: Solitary gallstone is noted. No biliary dilatation is noted. No focal abnormality is noted in the patent parenchyma on these unenhanced images. Pancreas: Unremarkable. No pancreatic ductal dilatation or  surrounding inflammatory changes. Spleen: Normal in size without focal abnormality. Adrenals/Urinary Tract: Adrenal glands are unremarkable. Severe polycystic kidney disease is again noted. The left kidney is markedly enlarged with multiple layering high density area is consistent with large perinephric hematoma. The kidney currently measures 24 by 20 x 16 cm which is slightly enlarged compared to prior exam. Urinary bladder is unremarkable. No definite hydronephrosis is noted. Stomach/Bowel: The stomach appears normal. The appendix appears normal. There is no  evidence of bowel obstruction or inflammation. Sigmoid diverticulosis is noted without inflammation. Vascular/Lymphatic: Aortic atherosclerosis. No enlarged abdominal or pelvic lymph nodes. Reproductive: Prostate is unremarkable. Other: Stable fat containing ventral hernia is noted in periumbilical region. Musculoskeletal: No acute or significant osseous findings. IMPRESSION: Findings consistent with polycystic kidney disease. Continued presence of large left perinephric hematoma which may be slightly increased in size compared to prior exam. No definite hydronephrosis is noted currently. Sigmoid diverticulosis without inflammation. Cholelithiasis. Aortic Atherosclerosis (ICD10-I70.0). Electronically Signed   By: Marijo Conception M.D.   On: 08/26/2018 12:50   Ct Abdomen Pelvis W Contrast  Result Date: 08/01/2018 CLINICAL DATA:  Hematuria this morning. On hemodialysis. History of anticoagulation for aortic valve replacement. EXAM: CT ABDOMEN AND PELVIS WITH CONTRAST TECHNIQUE: Multidetector CT imaging of the abdomen and pelvis was performed using the standard protocol following bolus administration of intravenous contrast. CONTRAST:  181m ISOVUE-300 IOPAMIDOL (ISOVUE-300) INJECTION 61% COMPARISON:  Abdominopelvic CT 07/23/2018 FINDINGS: Lower chest: Mild atelectasis at the left lung base. No significant pleural or pericardial effusion. Previous median  sternotomy and bilateral gynecomastia noted. Hepatobiliary: The liver is normal in density without suspicious focal abnormality. There is a small calcified gallstone. No gallbladder wall thickening or biliary dilatation. Pancreas: Unremarkable. No pancreatic ductal dilatation or surrounding inflammatory changes. Spleen: Normal in size without focal abnormality. Adrenals/Urinary Tract: Both adrenal glands appear normal. Both kidneys demonstrate enlargement and numerous complex cysts consistent with adult polycystic kidney disease. Compared with the recent prior study, the right kidney appears unchanged. There has been significant change in appearance of the left kidney however. There is a new heterogeneous, predominately high density mass involving the left kidney consistent with a hematoma. This measures up to 15.0 x 13.1 cm transverse on image 31/5 and extends 18.3 cm in height (image 98/8). In addition, there is a new left perinephric hematoma which measures up to 3.2 cm in thickness. There is probable clot within the bladder lumen as before. No hydronephrosis. Stomach/Bowel: No evidence of bowel wall thickening, distention or surrounding inflammatory change. Sigmoid diverticulosis. Vascular/Lymphatic: There are no enlarged abdominal or pelvic lymph nodes. Aortic and branch vessel atherosclerosis without acute vascular findings. Reproductive: The prostate gland and seminal vesicles appear normal. Other: Stable periumbilical hernia containing only fat. No ascites or free intraperitoneal air. Musculoskeletal: No acute or significant osseous findings. There are extensive endplate degenerative changes throughout the spine consistent with renal osteodystrophy. IMPRESSION: 1. Interval significant change in appearance of the left kidney compared with previous CT of 9 days ago. The left kidney is enlarged with heterogeneous high density consistent with bleeding into one or more of the complex left renal cysts. In  addition, there is a moderate size left perinephric hematoma. 2. Underlying changes of adult polycystic kidney disease bilaterally. 3. Dependent high density in the bladder again noted, likely blood clot. 4. Other incidental findings are stable, including sigmoid diverticulosis, a periumbilical hernia containing only fat and Aortic Atherosclerosis (ICD10-I70.0). Electronically Signed   By: WRichardean SaleM.D.   On: 08/01/2018 14:40   Ir Fluoro Guide Cv Line Right  Result Date: 08/27/2018 INDICATION: Poor venous access. In need of durable intravenous access for medication administration and blood draws while admitted to the hospital. Patient with end-stage renal disease, on dialysis, as such, request made for placement of a non tunneled central venous catheter. EXAM: ULTRASOUND AND FLUOROSCOPIC GUIDED NON TUNNELED CENTRAL VENOUS CATHETER INSERTION MEDICATIONS: None. CONTRAST:  None FLUOROSCOPY TIME:  3 minutes, 36 seconds (949.4mGy) COMPLICATIONS:  None immediate. TECHNIQUE: The procedure, risks, benefits, and alternatives were explained to the patient and informed written consent was obtained. A timeout was performed prior to the initiation of the procedure. The right chest and neck were prepped with chlorhexidine in a sterile fashion, and a sterile drape was applied covering the operative field. Maximum barrier sterile technique with sterile gowns and gloves were used for the procedure. A timeout was performed prior to the initiation of the procedure. Local anesthesia was provided with 1% lidocaine. Sonographic evaluation performed right neck and failed to delineate a patent right internal jugular vein, however a patent external jugular vein was identified. An ultrasound image was saved procedural documentation purposes. After the overlying soft tissues were anesthetized, a small venotomy incision was created and a micropuncture kit was utilized to access the right external jugular vein. Real-time ultrasound  guidance was utilized for vascular access including the acquisition of a permanent ultrasound image documenting patency of the accessed vessel. As there was some difficulty advancing the guidewire centrally, limited contrast injection was performed via the outer sheath of the micropuncture kit demonstrating tortuosity though patency of the SVC. A guidewire was advanced to the level of the superior caval-atrial junction for measurement purposes and the PICC line was cut to length. A peel-away sheath was placed and a 20 cm, 5 [place], dual lumen was inserted to level of the superior caval-atrial junction. A post procedure spot fluoroscopic was obtained. The catheter easily aspirated and flushed and was sutured in place. A dressing was placed. The patient tolerated the procedure well without immediate post procedural complication. FINDINGS: After catheter placement, the tip lies within the superior cavoatrial junction. The catheter aspirates and flushes normally and is ready for immediate use. IMPRESSION: Successful ultrasound and fluoroscopic guided placement of a right external jugular vein approach, 20 cm, 5 French, dual lumen PICC with tip at the superior caval-atrial junction. The PICC line is ready for immediate use. Electronically Signed   By: Sandi Mariscal M.D.   On: 08/27/2018 14:57   Ir US Guide Vasc Access Right  Result Date: 08/27/2018 INDICATION: Poor venous access. In need of durable intravenous access for medication administration and blood draws while admitted to the hospital. Patient with end-stage renal disease, on dialysis, as such, request made for placement of a non tunneled central venous catheter. EXAM: ULTRASOUND AND FLUOROSCOPIC GUIDED NON TUNNELED CENTRAL VENOUS CATHETER INSERTION MEDICATIONS: None. CONTRAST:  None FLUOROSCOPY TIME:  3 minutes, 36 seconds (16.9 mGy) COMPLICATIONS: None immediate. TECHNIQUE: The procedure, risks, benefits, and alternatives were explained to the patient and  informed written consent was obtained. A timeout was performed prior to the initiation of the procedure. The right chest and neck were prepped with chlorhexidine in a sterile fashion, and a sterile drape was applied covering the operative field. Maximum barrier sterile technique with sterile gowns and gloves were used for the procedure. A timeout was performed prior to the initiation of the procedure. Local anesthesia was provided with 1% lidocaine. Sonographic evaluation performed right neck and failed to delineate a patent right internal jugular vein, however a patent external jugular vein was identified. An ultrasound image was saved procedural documentation purposes. After the overlying soft tissues were anesthetized, a small venotomy incision was created and a micropuncture kit was utilized to access the right external jugular vein. Real-time ultrasound guidance was utilized for vascular access including the acquisition of a permanent ultrasound image documenting patency of the accessed vessel. As there was some difficulty advancing the guidewire  centrally, limited contrast injection was performed via the outer sheath of the micropuncture kit demonstrating tortuosity though patency of the SVC. A guidewire was advanced to the level of the superior caval-atrial junction for measurement purposes and the PICC line was cut to length. A peel-away sheath was placed and a 20 cm, 5 [place], dual lumen was inserted to level of the superior caval-atrial junction. A post procedure spot fluoroscopic was obtained. The catheter easily aspirated and flushed and was sutured in place. A dressing was placed. The patient tolerated the procedure well without immediate post procedural complication. FINDINGS: After catheter placement, the tip lies within the superior cavoatrial junction. The catheter aspirates and flushes normally and is ready for immediate use. IMPRESSION: Successful ultrasound and fluoroscopic guided placement of a  right external jugular vein approach, 20 cm, 5 French, dual lumen PICC with tip at the superior caval-atrial junction. The PICC line is ready for immediate use. Electronically Signed   By: Sandi Mariscal M.D.   On: 08/27/2018 14:57   Dg Chest Port 1 View  Result Date: 08/19/2018 CLINICAL DATA:  Abdominal pain, bleeding of the penis EXAM: PORTABLE CHEST 1 VIEW COMPARISON:  08/01/2018 FINDINGS: There is no focal parenchymal opacity. There is no pleural effusion or pneumothorax. The heart and mediastinal contours are unremarkable. There is prior median sternotomy. The osseous structures are unremarkable. IMPRESSION: No active disease. Electronically Signed   By: Kathreen Devoid   On: 08/19/2018 18:49   Dg Chest Port 1 View  Result Date: 08/01/2018 CLINICAL DATA:  Shortness of breath. History of aortic valve replacement and CABG. EXAM: PORTABLE CHEST 1 VIEW COMPARISON:  01/05/2018. FINDINGS: 1200 hours. Lordotic positioning. The heart size and mediastinal contours are stable status post median sternotomy and CABG. The lungs are clear. There is no pleural effusion or pneumothorax. Right axillary vascular stent again noted. IMPRESSION: Stable chest.  No active cardiopulmonary process. Electronically Signed   By: Richardean Sale M.D.   On: 08/01/2018 13:03   Ct Renal Abd W/wo  Result Date: 08/04/2018 CLINICAL DATA:  Hematuria. EXAM: CT ABDOMEN WITHOUT AND WITH CONTRAST TECHNIQUE: Multidetector CT imaging of the abdomen was performed following the standard protocol before and following the bolus administration of intravenous contrast. CONTRAST:  116m OMNIPAQUE IOHEXOL 300 MG/ML  SOLN COMPARISON:  08/01/2018 FINDINGS: Lower chest: Coronary artery calcification is evident. Basilar atelectasis noted bilaterally, left greater than right. Right gynecomastia evident. Hepatobiliary: No focal abnormality in the liver on this study without intravenous contrast. Calcified gallstone noted dependent gallbladder lumen. High  density material in the lumen of the gallbladder likely vicarious excretion from recent contrast infused CT. No intrahepatic or extrahepatic biliary dilation. Pancreas: No focal mass lesion. No dilatation of the main duct. No intraparenchymal cyst. No peripancreatic edema. Spleen: No splenomegaly. No focal mass lesion. Adrenals/Urinary Tract: No adrenal nodule or mass. Innumerable cysts are identified in the right kidney, similar to prior. No right hydroureter evident. Continued slight enlargement of the markedly abnormal and enlarged left kidney measuring 15.5 x 14.5 x 18.8 cm today compared to 15.0 x 13.1 x 18.3 cm previously. Similar appearance of the perinephric hematoma. Stomach/Bowel: Stomach is unremarkable. No gastric wall thickening. No evidence of outlet obstruction. Duodenum is normally positioned as is the ligament of Treitz. Visualize small bowel loops and colonic segments of the abdomen are nondilated. Vascular/Lymphatic: There is abdominal aortic atherosclerosis without aneurysm. Small hepato duodenal ligament lymph nodes are stable. Other: No intraperitoneal free fluid. Musculoskeletal: No worrisome lytic or sclerotic osseous  abnormality. IMPRESSION: 1. Similar appearance of the abnormally enlarged left kidney although it has increased slightly in size in the interval. As mentioned on CT from 3 days ago, imaging features are compatible with hemorrhage into 1 or more the cystic lesions. Associated moderate left perinephric hematoma is similar to prior. 2. Innumerable cysts of varying size and attenuation in the right kidney, compatible with polycystic kidney disease. No definite right hydroureteronephrosis. 3. Anatomic pelvis was not imaged as part of this abdomen only CT. Electronically Signed   By: Misty Stanley M.D.   On: 08/04/2018 18:01    Labs:  CBC: Recent Labs    08/26/18 0713  08/26/18 1956 08/27/18 0639 08/27/18 1637 08/28/18 0326  WBC 13.3*  --  15.3* 13.0*  --  15.5*  HGB  6.6*   < > 7.5* 6.8* 7.2* 6.8*  HCT 21.3*   < > 23.6* 21.9* 22.2* 21.1*  PLT 303  --  321 311  --  291   < > = values in this interval not displayed.    COAGS: Recent Labs    09/01/17 0632 09/01/17 1932  08/19/18 1513  08/26/18 1956 08/27/18 0639 08/28/18 0326 08/28/18 1138  INR  --  1.39   < > 5.5*   < > 1.6* 1.7* 2.3* 2.4*  APTT 36 39*  --  73*  --   --   --   --   --    < > = values in this interval not displayed.    BMP: Recent Labs    08/23/18 0800 08/24/18 0302 08/25/18 0701 08/28/18 0326  NA 136 136 135 133*  K 4.5 4.1 4.1 4.2  CL 97* 100 96* 91*  CO2 _0 GLUCOSE 78 94 85 104*  BUN 56* 29* 53* 65*  CALCIUM 8.4* 8.5* 8.2* 8.1*  CREATININE 8.93* 5.25* 7.48* 7.78*  GFRNONAA 6* 12* 8* 7*  GFRAA 7* 13* 9* 8*    LIVER FUNCTION TESTS: Recent Labs    10/25/17 1114 08/01/18 1312 08/01/18 1354  08/19/18 1513  08/23/18 0800 08/24/18 0302 08/25/18 0701 08/28/18 0326  BILITOT 0.6 1.3* 1.3*  --  1.3*  --   --   --   --   --   AST _1 --  49*  --   --   --   --   --   ALT _2 --  23  --   --   --   --   --   ALKPHOS 89 105 103  --  196*  --   --   --   --   --   PROT 7.7 7.3 7.4  --  7.4  --   --   --   --   --   ALBUMIN 2.9* 2.5* 2.3*   < > 2.1*   < > 1.6* 1.6* 1.5* 1.4*   < > = values in this interval not displayed.    TUMOR MARKERS: No results for input(s): AFPTM, CEA, CA199, CHROMGRNA in the last 8760 hours.  Assessment and Plan:  Left perinephric hematoma Enlarging despite anticoagulation  Scheduled for renal arteriogram with probable embolization in IR 5/13 Need INR 1.5 or lower to proceed  Risks and benefits of renal arteriogram with probable embolization were discussed with the patient including, but not limited to bleeding, infection, vascular injury or contrast induced renal failure.  This interventional procedure involves the use of X-rays and because of  the nature of the planned procedure, it is possible that we will  have prolonged use of X-ray fluoroscopy.  Potential radiation risks to you include (but are not limited to) the following: - A slightly elevated risk for cancer  several years later in life. This risk is typically less than 0.5% percent. This risk is low in comparison to the normal incidence of human cancer, which is 33% for women and 50% for men according to the [employer]. - Radiation induced injury can include skin redness, resembling a rash, tissue breakdown / ulcers and hair loss (which can be temporary or permanent).   The likelihood of either of these occurring depends on the difficulty of the procedure and whether you are sensitive to radiation due to previous procedures, disease, or genetic conditions.   IF your procedure requires a prolonged use of radiation, you will be notified and given written instructions for further action.  It is your responsibility to monitor the irradiated area for the 2 weeks following the procedure and to notify your physician if you are concerned that you have suffered a radiation induced injury.    All of the patient's questions were answered, patient is agreeable to proceed.  Consent signed and in chart.  Thank you for this interesting consult.  I greatly enjoyed meeting [name] [name] and look forward to participating in their care.  A copy of this report was sent to the requesting provider on this date.  Electronically Signed: Lavonia Drafts, PA-C 08/28/2018, 12:40 PM   I spent a total of 40 Minutes    in face to face in clinical consultation, greater than 50% of which was counseling/coordinating care for renal arteriogram with embolization

## 2018-08-29 ENCOUNTER — Encounter (HOSPITAL_COMMUNITY): Payer: Self-pay | Admitting: Interventional Radiology

## 2018-08-29 ENCOUNTER — Inpatient Hospital Stay (HOSPITAL_COMMUNITY): Payer: Medicare Other

## 2018-08-29 DIAGNOSIS — E46 Unspecified protein-calorie malnutrition: Secondary | ICD-10-CM

## 2018-08-29 HISTORY — PX: IR US GUIDE VASC ACCESS RIGHT: IMG2390

## 2018-08-29 HISTORY — PX: IR RENAL SUPRASEL UNI S&I MOD SED: IMG655

## 2018-08-29 HISTORY — PX: IR EMBO ART  VEN HEMORR LYMPH EXTRAV  INC GUIDE ROADMAPPING: IMG5450

## 2018-08-29 LAB — TYPE AND SCREEN
ABO/RH(D): B POS
Antibody Screen: NEGATIVE
Unit division: 0
Unit division: 0
Unit division: 0
Unit division: 0

## 2018-08-29 LAB — BPAM RBC
Blood Product Expiration Date: 202005132359
Blood Product Expiration Date: 202005162359
Blood Product Expiration Date: 202005162359
Blood Product Expiration Date: 202005252359
ISSUE DATE / TIME: 202005090938
ISSUE DATE / TIME: 202005101008
ISSUE DATE / TIME: 202005111054
ISSUE DATE / TIME: 202005120852
Unit Type and Rh: 1700
Unit Type and Rh: 7300
Unit Type and Rh: 7300
Unit Type and Rh: 7300

## 2018-08-29 LAB — CBC
HCT: 23.1 % — ABNORMAL LOW (ref 39.0–52.0)
Hemoglobin: 7.3 g/dL — ABNORMAL LOW (ref 13.0–17.0)
MCH: 27.2 pg (ref 26.0–34.0)
MCHC: 31.6 g/dL (ref 30.0–36.0)
MCV: 86.2 fL (ref 80.0–100.0)
Platelets: 310 10*3/uL (ref 150–400)
RBC: 2.68 MIL/uL — ABNORMAL LOW (ref 4.22–5.81)
RDW: 17.2 % — ABNORMAL HIGH (ref 11.5–15.5)
WBC: 14.8 10*3/uL — ABNORMAL HIGH (ref 4.0–10.5)
nRBC: 0 % (ref 0.0–0.2)

## 2018-08-29 LAB — RENAL FUNCTION PANEL
Albumin: 1.4 g/dL — ABNORMAL LOW (ref 3.5–5.0)
Anion gap: 11 (ref 5–15)
BUN: 37 mg/dL — ABNORMAL HIGH (ref 6–20)
CO2: 28 mmol/L (ref 22–32)
Calcium: 8.3 mg/dL — ABNORMAL LOW (ref 8.9–10.3)
Chloride: 96 mmol/L — ABNORMAL LOW (ref 98–111)
Creatinine, Ser: 5.66 mg/dL — ABNORMAL HIGH (ref 0.61–1.24)
GFR calc Af Amer: 12 mL/min — ABNORMAL LOW (ref >60)
GFR calc non Af Amer: 11 mL/min — ABNORMAL LOW (ref >60)
Glucose, Bld: 86 mg/dL (ref 70–99)
Phosphorus: 4.4 mg/dL (ref 2.5–4.6)
Potassium: 3.6 mmol/L (ref 3.5–5.1)
Sodium: 135 mmol/L (ref 135–145)

## 2018-08-29 LAB — PROTIME-INR
INR: 1.6 — ABNORMAL HIGH (ref 0.8–1.2)
INR: 1.4 — ABNORMAL HIGH (ref 0.8–1.2)
Prothrombin Time: 18.6 seconds — ABNORMAL HIGH (ref 11.4–15.2)
Prothrombin Time: 17.3 seconds — ABNORMAL HIGH (ref 11.4–15.2)

## 2018-08-29 MED ORDER — CHLORHEXIDINE GLUCONATE CLOTH 2 % EX PADS
6.0000 | MEDICATED_PAD | Freq: Every day | CUTANEOUS | Status: DC
  Administered 2018-08-29 – 2018-09-04 (×5): 6 via TOPICAL

## 2018-08-29 MED ORDER — FENTANYL CITRATE (PF) 100 MCG/2ML IJ SOLN
INTRAMUSCULAR | Status: AC | PRN
  Administered 2018-08-29 (×2): 25 ug via INTRAVENOUS
  Administered 2018-08-29: 50 ug via INTRAVENOUS

## 2018-08-29 MED ORDER — SODIUM CHLORIDE 0.9% FLUSH: 9.0000 mL | INTRAVENOUS | Status: DC | PRN

## 2018-08-29 MED ORDER — ONDANSETRON HCL 4 MG/2ML IJ SOLN: 4.0000 mg | Freq: Four times a day (QID) | INTRAMUSCULAR | Status: DC | PRN

## 2018-08-29 MED ORDER — DIPHENHYDRAMINE HCL 12.5 MG/5ML PO ELIX: 12.5000 mg | ORAL_SOLUTION | Freq: Four times a day (QID) | ORAL | Status: DC | PRN

## 2018-08-29 MED ORDER — MIDAZOLAM HCL 2 MG/2ML IJ SOLN
INTRAMUSCULAR | Status: AC
  Filled 2018-08-29: qty 2

## 2018-08-29 MED ORDER — LIDOCAINE HCL 1 % IJ SOLN
INTRAMUSCULAR | Status: AC
  Filled 2018-08-29: qty 20

## 2018-08-29 MED ORDER — MIDAZOLAM HCL 2 MG/2ML IJ SOLN
INTRAMUSCULAR | Status: AC | PRN
  Administered 2018-08-29 (×2): 0.5 mg via INTRAVENOUS
  Administered 2018-08-29: 1 mg via INTRAVENOUS

## 2018-08-29 MED ORDER — FENTANYL CITRATE (PF) 100 MCG/2ML IJ SOLN
INTRAMUSCULAR | Status: AC
  Filled 2018-08-29: qty 2

## 2018-08-29 MED ORDER — DIPHENHYDRAMINE HCL 50 MG/ML IJ SOLN: 12.5000 mg | Freq: Four times a day (QID) | INTRAMUSCULAR | Status: DC | PRN

## 2018-08-29 MED ORDER — HYDROMORPHONE 1 MG/ML IV SOLN
INTRAVENOUS | Status: DC
  Administered 2018-08-29: 13 mg via INTRAVENOUS
  Administered 2018-08-29: 25 mg via INTRAVENOUS
  Administered 2018-08-30: 0.3 mg via INTRAVENOUS
  Administered 2018-08-30: 1 mg via INTRAVENOUS
  Administered 2018-08-30: 25 mg via INTRAVENOUS
  Filled 2018-08-29 (×9): qty 30

## 2018-08-29 MED ORDER — NALOXONE HCL 0.4 MG/ML IJ SOLN: 0.4000 mg | INTRAMUSCULAR | Status: DC | PRN

## 2018-08-29 NOTE — Progress Notes (Signed)
[place] for heparin/warfarin Indication: Mechanical valve  No Known Allergies  Patient Measurements: Height: 6\' 1"  (185.4 cm) Weight: 297 lb 13.5 oz (135.1 kg) IBW/kg (Calculated) : 79.9  Vital Signs: Temp: 97.9 F (36.6 C) (05/13 0510) Temp Source: Oral (05/13 0510) BP: 93/68 (05/13 0510) Pulse Rate: 86 (05/13 0510)  Labs: Recent Labs    08/27/18 0639  08/28/18 0326  08/28/18 1840 08/29/18 0400 08/29/18 0732  HGB 6.8*   < > 6.8*  --  7.5* 7.3*  --   HCT 21.9*   < > 21.1*  --  23.3* 23.1*  --   PLT 311  --  291  --   --  310  --   LABPROT 20.0*  --  24.8*   < > 21.6* 18.6* 17.3*  INR 1.7*  --  2.3*   < > 1.9* 1.6* 1.4*  CREATININE  --   --  7.78*  --   --  5.66*  --    < > = values in this interval not displayed.    Estimated Creatinine Clearance: 21.8 mL/min (A) (by C-G formula based on SCr of 5.66 mg/dL (H)).   Medical History: Past Medical History:  Diagnosis Date  . Aneurysm ([place])     Right arm fistula 3 aneurysms 2011,   plans to have a new procedure  . Angina   . Chest discomfort   . CHF (congestive heart failure) ([place])   . Coronary artery disease   . Coronary artery disease involving native coronary artery of native heart without angina pectoris   . Dialysis patient ([place])   . Dysrhythmia   . ESRD (end stage renal disease) ([place])    on hemodialysis T_T_S  . Hypertension   . Leg pain   . Mitral regurgitation   . Morbid obesity ([place])   . Orthostatic hypotension   . Overweight(278.02)   . Renal insufficiency   . 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 Severe aortic insufficiency   . Sinus tachycardia   . Syncope     positional after dialysis... 2008   Assessment: 16 YOM on warfarin PTA for mech aortic valve with subtherapeutic INR started on heparin with no bolus and tight  goals d/t bleed risk with current hematuria.   Last dose warfarin was on 5/2 and patient is s/p 8 mg Vitamin K. INR goal has been decreased to 2-2.5  5/12: Hgb continues to trend down. INR down to 1.4 this AM after vitamin K. Embolization schedule for today. Anticoagulation currently on hold.    Goal of Therapy:  Heparin level 0.3-0.5 units/ml Monitor platelets by anticoagulation protocol: Yes   Plan:  Holding heparin Holding warfarin  Follow up plan to restart anticoagulation Daily INR and CBC  Chrisopher Pustejovsky A. Levada Dy, PharmD, [place] Please utilize [facility]ion for appropriate phone number to reach the unit pharmacist ([place])    08/29/2018 9:01 AM

## 2018-08-29 NOTE — Progress Notes (Signed)
Patient ID: [name] [name], male   DOB: 1965-07-12, 53 y.o.   MRN: [id number]    Subjective: Pt still with left flank/abdominal pain.  Plan to proceed with embolization today.  Objective: Vital signs in last 24 hours: Temp:  [97.5 F (36.4 C)-98.9 F (37.2 C)] 97.9 F (36.6 C) (05/13 0510) Pulse Rate:  [86-97] 86 (05/13 0510) Resp:  [15-26] 15 (05/13 0510) BP: (81-104)/(48-69) 93/68 (05/13 0510) SpO2:  [99 %-100 %] 100 % (05/13 0510) Weight:  [135.1 kg] 135.1 kg (05/12 2046)  Intake/Output from previous day: 05/12 0701 - 05/13 0700 In: 565 [P.O.:200; Blood:315; IV Piggyback:50] Out: 0  Intake/Output this shift: No intake/output data recorded.  Physical Exam:  General: Alert and oriented Abdomen: Soft, ND, Moderate left CVAT  Lab Results: Recent Labs    08/28/18 0326 08/28/18 1840 08/29/18 0400  HGB 6.8* 7.5* 7.3*  HCT 21.1* 23.3* 23.1*   BMET Recent Labs    08/28/18 0326 08/29/18 0400  NA 133* 135  K 4.2 3.6  CL 91* 96*  CO2 26 28  GLUCOSE 104* 86  BUN 65* 37*  CREATININE 7.78* 5.66*  CALCIUM 8.1* 8.3*     Studies/Results: Ct Abdomen Pelvis W Wo Contrast  Result Date: 08/27/2018 CLINICAL DATA:  History end-stage renal disease secondary to polycystic kidney disease now with enlarging left-sided renal hemorrhage EXAM: CT ABDOMEN AND PELVIS WITHOUT AND WITH CONTRAST TECHNIQUE: Multidetector CT imaging of the abdomen and pelvis was performed following the standard protocol before and following the bolus administration of intravenous contrast. CONTRAST:  168m OMNIPAQUE IOHEXOL 300 MG/ML  SOLN COMPARISON:  08/26/2018; 08/19/2018; 08/04/2018; 08/01/2018; 07/23/2018 FINDINGS: Lower chest: Limited visualization lower thorax demonstrates trace left-sided pleural effusion with associated dependent subpleural ground-glass atelectasis. Normal heart size. Coronary artery calcifications. Post median sternotomy and aortic valve replacement. No pericardial effusion.  Hepatobiliary: Normal hepatic contour. No discrete hepatic lesions. Unchanged approximately 0.8 cm stone lying dependently within the lumen of otherwise normal-appearing gallbladder. No gallbladder wall thickening or pericholecystic fluid. No intrahepatic biliary duct dilatation. No ascites. Pancreas: Normal appearance of the pancreas. Spleen: Subcentimeter hypoattenuating lesion within the hilum of the spleen is too small to adequately characterize though may represent a splenic cyst. Adrenals/Urinary Tract: Re-demonstrated sequela of advanced polycystic kidney disease. Again, the left kidney is near completely replaced with multiple hemorrhagic cysts with multiple hematocrit levels demonstrated on the precontrast images (representative images 26, 37 and 57, series 3). The hemorrhagic left kidney has continued to enlarge, now measuring approximately 20.4 x 17.7 x 23.7 cm (axial image 63, series 10; coronal image 46, series 13), previously, 18.3 x 16.3 x 21.9 cm (compared to the 08/19/2018), now also with slight extension to involve the caudal aspect of left retroperitoneal space (image 111, series 15) but without definitive intraperitoneal extension. Ill-defined punctate (subcentimeter) areas of vessel irregularity are seen involving the anterior (axial image 81, series 10; coronal image 27, series 13) and posteromedial aspect (axial image 89, series 10; coronal image 58, series 13) of the left kidney without sizable area of contrast extravasation. No discrete underlying hyperenhancing left-sided renal lesion. No evidence of right-sided renal hemorrhage. Normal appearance of the bilateral adrenal glands. Normal appearance of the urinary bladder given degree of distention. Stomach/Bowel: Rather extensive colonic diverticulosis without evidence of superimposed acute diverticulitis. No pneumoperitoneum, pneumatosis or portal venous gas. Vascular/Lymphatic: Atherosclerotic plaque within a normal caliber abdominal  aorta. Note is made of a solitary left renal artery. While the left renal artery appears somewhat diminutive does  appear patent and again, there are no sizable areas of contrast extravasation to suggest acute arterial extravasation. Reproductive: Normal appearance the prostate gland. No free fluid the pelvic cul-de-sac. Other: Diffuse body wall anasarca. Note is made of a approximately 5.9 x 3.6 cm mesenteric fat containing periumbilical hernia. Musculoskeletal: No acute or aggressive osseous abnormalities. Moderate severe multilevel lumbar spine DDD. Stigmata of DISH within the caudal aspect of the thoracic spine. IMPRESSION: 1. Sequela of advanced polycystic kidney disease with continued enlargement of complex intraparenchymal and perinephric hemorrhage replacing the entirety of the left kidney without discrete area of contrast extravasation or hyperenhancing underlying renal lesion. 2. Colonic diverticulosis without evidence of superimposed acute diverticulitis. 3. Cholelithiasis without evidence of cholecystitis. 4.  Aortic Atherosclerosis (ICD10-I70.0). Electronically Signed   By: Sandi Mariscal M.D.   On: 08/27/2018 16:23   Ir Fluoro Guide Cv Line Right  Result Date: 08/27/2018 INDICATION: Poor venous access. In need of durable intravenous access for medication administration and blood draws while admitted to the hospital. Patient with end-stage renal disease, on dialysis, as such, request made for placement of a non tunneled central venous catheter. EXAM: ULTRASOUND AND FLUOROSCOPIC GUIDED NON TUNNELED CENTRAL VENOUS CATHETER INSERTION MEDICATIONS: None. CONTRAST:  None FLUOROSCOPY TIME:  3 minutes, 36 seconds (66.2 mGy) COMPLICATIONS: None immediate. TECHNIQUE: The procedure, risks, benefits, and alternatives were explained to the patient and informed written consent was obtained. A timeout was performed prior to the initiation of the procedure. The right chest and neck were prepped with chlorhexidine in a  sterile fashion, and a sterile drape was applied covering the operative field. Maximum barrier sterile technique with sterile gowns and gloves were used for the procedure. A timeout was performed prior to the initiation of the procedure. Local anesthesia was provided with 1% lidocaine. Sonographic evaluation performed right neck and failed to delineate a patent right internal jugular vein, however a patent external jugular vein was identified. An ultrasound image was saved procedural documentation purposes. After the overlying soft tissues were anesthetized, a small venotomy incision was created and a micropuncture kit was utilized to access the right external jugular vein. Real-time ultrasound guidance was utilized for vascular access including the acquisition of a permanent ultrasound image documenting patency of the accessed vessel. As there was some difficulty advancing the guidewire centrally, limited contrast injection was performed via the outer sheath of the micropuncture kit demonstrating tortuosity though patency of the SVC. A guidewire was advanced to the level of the superior caval-atrial junction for measurement purposes and the PICC line was cut to length. A peel-away sheath was placed and a 20 cm, 5 [place], dual lumen was inserted to level of the superior caval-atrial junction. A post procedure spot fluoroscopic was obtained. The catheter easily aspirated and flushed and was sutured in place. A dressing was placed. The patient tolerated the procedure well without immediate post procedural complication. FINDINGS: After catheter placement, the tip lies within the superior cavoatrial junction. The catheter aspirates and flushes normally and is ready for immediate use. IMPRESSION: Successful ultrasound and fluoroscopic guided placement of a right external jugular vein approach, 20 cm, 5 French, dual lumen PICC with tip at the superior caval-atrial junction. The PICC line is ready for immediate use.  Electronically Signed   By: Sandi Mariscal M.D.   On: 08/27/2018 14:57   Ir US Guide Vasc Access Right  Result Date: 08/27/2018 INDICATION: Poor venous access. In need of durable intravenous access for medication administration and blood draws while admitted to  the hospital. Patient with end-stage renal disease, on dialysis, as such, request made for placement of a non tunneled central venous catheter. EXAM: ULTRASOUND AND FLUOROSCOPIC GUIDED NON TUNNELED CENTRAL VENOUS CATHETER INSERTION MEDICATIONS: None. CONTRAST:  None FLUOROSCOPY TIME:  3 minutes, 36 seconds (75.4 mGy) COMPLICATIONS: None immediate. TECHNIQUE: The procedure, risks, benefits, and alternatives were explained to the patient and informed written consent was obtained. A timeout was performed prior to the initiation of the procedure. The right chest and neck were prepped with chlorhexidine in a sterile fashion, and a sterile drape was applied covering the operative field. Maximum barrier sterile technique with sterile gowns and gloves were used for the procedure. A timeout was performed prior to the initiation of the procedure. Local anesthesia was provided with 1% lidocaine. Sonographic evaluation performed right neck and failed to delineate a patent right internal jugular vein, however a patent external jugular vein was identified. An ultrasound image was saved procedural documentation purposes. After the overlying soft tissues were anesthetized, a small venotomy incision was created and a micropuncture kit was utilized to access the right external jugular vein. Real-time ultrasound guidance was utilized for vascular access including the acquisition of a permanent ultrasound image documenting patency of the accessed vessel. As there was some difficulty advancing the guidewire centrally, limited contrast injection was performed via the outer sheath of the micropuncture kit demonstrating tortuosity though patency of the SVC. A guidewire was advanced  to the level of the superior caval-atrial junction for measurement purposes and the PICC line was cut to length. A peel-away sheath was placed and a 20 cm, 5 [place], dual lumen was inserted to level of the superior caval-atrial junction. A post procedure spot fluoroscopic was obtained. The catheter easily aspirated and flushed and was sutured in place. A dressing was placed. The patient tolerated the procedure well without immediate post procedural complication. FINDINGS: After catheter placement, the tip lies within the superior cavoatrial junction. The catheter aspirates and flushes normally and is ready for immediate use. IMPRESSION: Successful ultrasound and fluoroscopic guided placement of a right external jugular vein approach, 20 cm, 5 French, dual lumen PICC with tip at the superior caval-atrial junction. The PICC line is ready for immediate use. Electronically Signed   By: Sandi Mariscal M.D.   On: 08/27/2018 14:57    Assessment/Plan: Left perinephric hematoma: Pt to proceed with embolization of left kidney today per IR.  Discussed likely post-embolization syndrome after procedure today including fever, pain, and possible blood in urine.  Antipyretics and pain medication as needed for symptoms.  Continue to monitor Hgb.    LOS: 10 days   Dutch Gray 08/29/2018, 7:06 AM

## 2018-08-29 NOTE — Progress Notes (Signed)
   Subjective: No overnight events. [name] [name] reports that his appetite is still poor. It has been about three weeks since he has had a true meal. He has been drinking about two nepro shakes per day. He is eager for the surgery. All questions addressed.   Objective:  Vital signs in last 24 hours: Vitals:   08/28/18 1131 08/28/18 1748 08/28/18 2046 08/29/18 0510  BP: 104/64 95/69 102/65 93/68  Pulse: 95 93 92 86  Resp: 18 18 18 15   Temp: 98.2 F (36.8 C) 98.9 F (37.2 C) 98.7 F (37.1 C) 97.9 F (36.6 C)  TempSrc: Oral Oral Oral Oral  SpO2: 99% 100% 100% 100%  Weight:   135.1 kg   Height:       Gen: alert and oriented, no distress CV: RRR, mechanical AVR click Pulm: CTA in the anterior lung fields Abd: bs+, soft, mildly distended, non-tender Ext: no edema  Assessment/Plan:  Principal Problem:   Perinephric hematoma Active Problems:   ESRD (end stage renal disease) (HCC)   Morbid obesity (HCC)   Systolic CHF (HCC)   S/P aortic valve replacement with bileaflet mechanical valve   Acute blood loss anemia   Gross hematuria   Supratherapeutic INR  [name] [name] is a 53 yo man with CAD s/p CABG, CHF, ESRD on HD, polycystic kidney disease, and aortic valve replacement on Coumadin who presented withabdominalpain and bleeding from the penissecondary to an expanding left-sided perinephric hematoma from PCKD and supratherapeutic INR.Outside hospital CT demonstrated persistent and mildly increased hematoma,likely expanded from supratherapeutic INR.  L perinephric hematoma:  AVR on Coumadin Has required7 transfusions since admission. Hb stable at 7.3 this am. Scheduled for transcatheter embolization of the left kidney today. Post-op fever and pain is expected in the setting of organ ischemia. INR goal for procedure is <1.5. INR this morning is 1.4 after getting Vitamin K yesterday. - Urology following, appreciate recs - IRfollowing, embolization today -Hold warfarin, trend INR  -Pain control: scheduled tylenol, PO dilaudid 1mg q6hrs prn, IVfentanyl q6hrs for breakthrough - Trend CBC  ESRD on HD TTS -HD per nephrology - Continue home fosrenol - Continue ESA per nephro for iron deficiency anemia in the setting of acute blood loss and CKD - Trend BMP   Hypotension:Chronic hypotension with MAPs 60-65.Asymptomatic.Blood pressurestable.Will fluid bolus and recheck Hb if pressures worsenor patient becomes symptomatic. - Continue home midodrine 15mg  TID -Discontinued homemetoprolol  Leukocytosis: No obvious signs of infection. May be caused bystress reaction orresorption of known hematoma.   Malnutrition: Poor PO intake since admission. Likely due to mass effect of perinephric hematoma. His appetite may improve post-procedure. If not, he may require further nutritional support. - Nutrition following - Nepro shakes and magic cups   Dispo: Anticipated dischargeonce Hb stabilizes.  [name] [name], [name] Elk, MD 08/29/2018, 6:27 AM Pager: [phone]

## 2018-08-29 NOTE — Progress Notes (Signed)
  Date: 08/29/2018  Patient name: [name] [name]  Medical record number: [id number]  Date of birth: 1965/06/13   I have seen and evaluated this patient and I have discussed the plan of care with the house staff. Please see their note for complete details. I concur with their findings with the following additions/corrections:   Transfused again yesterday, Hgb up to 7.5, down to 7.3 today, relatively small response to transfusion suggests ongoing blood loss. Plan for embolization today with IR. INR 1.4 this morning after additional vitamin K yesterday.  Expect transiently worsened pain after embolization, but hopefully will help bleeding, pain and possibly even appetite in the long run. Will need to clarify plan for resumption of anticoagulation after procedure.  Lenice Pressman, M.D., Ph.D. 08/29/2018, 10:25 AM

## 2018-08-29 NOTE — Progress Notes (Signed)
[facility] Progress Note   Subjective:   S/p IR embolization this AM.  Post procedure pain noted.  O/w no complaints. No orthopnea.   Objective Vitals:   08/29/18 1100 08/29/18 1105 08/29/18 1110 08/29/18 1139  BP: 103/75 101/73 98/74 103/71  Pulse: 93 92 92 90  Resp: (!) 21 17 (!) 21 18  Temp:    98.2 F (36.8 C)  TempSrc:    Oral  SpO2: 98% 99% 99% 100%  Weight:      Height:       Physical Exam General: comfortably lying flat in bed Heart: RRR Lungs: clear to bases, normal WOB Abdomen: mildly TTP throughout Extremities: no edema Dialysis Access:  L FA AVF with thrill and bruit   Additional Objective Labs: Basic Metabolic Panel: Recent Labs  Lab 08/25/18 0701 08/28/18 0326 08/29/18 0400  NA 135 133* 135  K 4.1 4.2 3.6  CL 96* 91* 96*  CO2 _0 GLUCOSE 85 104* 86  BUN 53* 65* 37*  CREATININE 7.48* 7.78* 5.66*  CALCIUM 8.2* 8.1* 8.3*  PHOS 5.7* 5.9* 4.4   Liver Function Tests: Recent Labs  Lab 08/25/18 0701 08/28/18 0326 08/29/18 0400  ALBUMIN 1.5* 1.4* 1.4*   No results for input(s): LIPASE, AMYLASE in the last 168 hours. CBC: Recent Labs  Lab 08/26/18 0713  08/26/18 1956 08/27/18 0639  08/28/18 0326 08/28/18 1840 08/29/18 0400  WBC 13.3*  --  15.3* 13.0*  --  15.5*  --  14.8*  HGB 6.6*   < > 7.5* 6.8*   < > 6.8* 7.5* 7.3*  HCT 21.3*   < > 23.6* 21.9*   < > 21.1* 23.3* 23.1*  MCV 85.2  --  84.3 84.6  --  83.7  --  86.2  PLT 303  --  321 311  --  291  --  310   < > = values in this interval not displayed.   Blood Culture    Component Value Date/Time   SDES AORTIC VALVE TISSUE 09/01/2017 1220   SPECREQUEST NONE 09/01/2017 1220   CULT  09/01/2017 1220    No growth aerobically or anaerobically. Performed at [facility], [place] [address], [place], [place] [address]    REPTSTATUS 09/06/2017 FINAL 09/01/2017 1220    Cardiac Enzymes: No results for input(s): CKTOTAL, CKMB, CKMBINDEX, TROPONINI in the last 168  hours. CBG: No results for input(s): GLUCAP in the last 168 hours. Iron Studies: No results for input(s): IRON, TIBC, TRANSFERRIN, FERRITIN in the last 72 hours. _1 @ Studies/Results: Ct Abdomen Pelvis W Wo Contrast  Result Date: 08/27/2018 CLINICAL DATA:  History end-stage renal disease secondary to polycystic kidney disease now with enlarging left-sided renal hemorrhage EXAM: CT ABDOMEN AND PELVIS WITHOUT AND WITH CONTRAST TECHNIQUE: Multidetector CT imaging of the abdomen and pelvis was performed following the standard protocol before and following the bolus administration of intravenous contrast. CONTRAST:  170m OMNIPAQUE IOHEXOL 300 MG/ML  SOLN COMPARISON:  08/26/2018; 08/19/2018; 08/04/2018; 08/01/2018; 07/23/2018 FINDINGS: Lower chest: Limited visualization lower thorax demonstrates trace left-sided pleural effusion with associated dependent subpleural ground-glass atelectasis. Normal heart size. Coronary artery calcifications. Post median sternotomy and aortic valve replacement. No pericardial effusion. Hepatobiliary: Normal hepatic contour. No discrete hepatic lesions. Unchanged approximately 0.8 cm stone lying dependently within the lumen of otherwise normal-appearing gallbladder. No gallbladder wall thickening or pericholecystic fluid. No intrahepatic biliary duct dilatation. No ascites. Pancreas: Normal appearance of the pancreas. Spleen: Subcentimeter hypoattenuating lesion within the hilum  of the spleen is too small to adequately characterize though may represent a splenic cyst. Adrenals/Urinary Tract: Re-demonstrated sequela of advanced polycystic kidney disease. Again, the left kidney is near completely replaced with multiple hemorrhagic cysts with multiple hematocrit levels demonstrated on the precontrast images (representative images 26, 37 and 57, series 3). The hemorrhagic left kidney has continued to enlarge, now measuring approximately 20.4 x 17.7 x 23.7 cm (axial image 63,  series 10; coronal image 46, series 13), previously, 18.3 x 16.3 x 21.9 cm (compared to the 08/19/2018), now also with slight extension to involve the caudal aspect of left retroperitoneal space (image 111, series 15) but without definitive intraperitoneal extension. Ill-defined punctate (subcentimeter) areas of vessel irregularity are seen involving the anterior (axial image 81, series 10; coronal image 27, series 13) and posteromedial aspect (axial image 89, series 10; coronal image 58, series 13) of the left kidney without sizable area of contrast extravasation. No discrete underlying hyperenhancing left-sided renal lesion. No evidence of right-sided renal hemorrhage. Normal appearance of the bilateral adrenal glands. Normal appearance of the urinary bladder given degree of distention. Stomach/Bowel: Rather extensive colonic diverticulosis without evidence of superimposed acute diverticulitis. No pneumoperitoneum, pneumatosis or portal venous gas. Vascular/Lymphatic: Atherosclerotic plaque within a normal caliber abdominal aorta. Note is made of a solitary left renal artery. While the left renal artery appears somewhat diminutive does appear patent and again, there are no sizable areas of contrast extravasation to suggest acute arterial extravasation. Reproductive: Normal appearance the prostate gland. No free fluid the pelvic cul-de-sac. Other: Diffuse body wall anasarca. Note is made of a approximately 5.9 x 3.6 cm mesenteric fat containing periumbilical hernia. Musculoskeletal: No acute or aggressive osseous abnormalities. Moderate severe multilevel lumbar spine DDD. Stigmata of DISH within the caudal aspect of the thoracic spine. IMPRESSION: 1. Sequela of advanced polycystic kidney disease with continued enlargement of complex intraparenchymal and perinephric hemorrhage replacing the entirety of the left kidney without discrete area of contrast extravasation or hyperenhancing underlying renal lesion. 2.  Colonic diverticulosis without evidence of superimposed acute diverticulitis. 3. Cholelithiasis without evidence of cholecystitis. 4.  Aortic Atherosclerosis (ICD10-I70.0). Electronically Signed   By: Sandi Mariscal M.D.   On: 08/27/2018 16:23   Ir Fluoro Guide Cv Line Right  Result Date: 08/27/2018 INDICATION: Poor venous access. In need of durable intravenous access for medication administration and blood draws while admitted to the hospital. Patient with end-stage renal disease, on dialysis, as such, request made for placement of a non tunneled central venous catheter. EXAM: ULTRASOUND AND FLUOROSCOPIC GUIDED NON TUNNELED CENTRAL VENOUS CATHETER INSERTION MEDICATIONS: None. CONTRAST:  None FLUOROSCOPY TIME:  3 minutes, 36 seconds (83.4 mGy) COMPLICATIONS: None immediate. TECHNIQUE: The procedure, risks, benefits, and alternatives were explained to the patient and informed written consent was obtained. A timeout was performed prior to the initiation of the procedure. The right chest and neck were prepped with chlorhexidine in a sterile fashion, and a sterile drape was applied covering the operative field. Maximum barrier sterile technique with sterile gowns and gloves were used for the procedure. A timeout was performed prior to the initiation of the procedure. Local anesthesia was provided with 1% lidocaine. Sonographic evaluation performed right neck and failed to delineate a patent right internal jugular vein, however a patent external jugular vein was identified. An ultrasound image was saved procedural documentation purposes. After the overlying soft tissues were anesthetized, a small venotomy incision was created and a micropuncture kit was utilized to access the right external jugular vein.  Real-time ultrasound guidance was utilized for vascular access including the acquisition of a permanent ultrasound image documenting patency of the accessed vessel. As there was some difficulty advancing the guidewire  centrally, limited contrast injection was performed via the outer sheath of the micropuncture kit demonstrating tortuosity though patency of the SVC. A guidewire was advanced to the level of the superior caval-atrial junction for measurement purposes and the PICC line was cut to length. A peel-away sheath was placed and a 20 cm, 5 [place], dual lumen was inserted to level of the superior caval-atrial junction. A post procedure spot fluoroscopic was obtained. The catheter easily aspirated and flushed and was sutured in place. A dressing was placed. The patient tolerated the procedure well without immediate post procedural complication. FINDINGS: After catheter placement, the tip lies within the superior cavoatrial junction. The catheter aspirates and flushes normally and is ready for immediate use. IMPRESSION: Successful ultrasound and fluoroscopic guided placement of a right external jugular vein approach, 20 cm, 5 French, dual lumen PICC with tip at the superior caval-atrial junction. The PICC line is ready for immediate use. Electronically Signed   By: Sandi Mariscal M.D.   On: 08/27/2018 14:57   Ir US Guide Vasc Access Right  Result Date: 08/27/2018 INDICATION: Poor venous access. In need of durable intravenous access for medication administration and blood draws while admitted to the hospital. Patient with end-stage renal disease, on dialysis, as such, request made for placement of a non tunneled central venous catheter. EXAM: ULTRASOUND AND FLUOROSCOPIC GUIDED NON TUNNELED CENTRAL VENOUS CATHETER INSERTION MEDICATIONS: None. CONTRAST:  None FLUOROSCOPY TIME:  3 minutes, 36 seconds (25.0 mGy) COMPLICATIONS: None immediate. TECHNIQUE: The procedure, risks, benefits, and alternatives were explained to the patient and informed written consent was obtained. A timeout was performed prior to the initiation of the procedure. The right chest and neck were prepped with chlorhexidine in a sterile fashion, and a sterile  drape was applied covering the operative field. Maximum barrier sterile technique with sterile gowns and gloves were used for the procedure. A timeout was performed prior to the initiation of the procedure. Local anesthesia was provided with 1% lidocaine. Sonographic evaluation performed right neck and failed to delineate a patent right internal jugular vein, however a patent external jugular vein was identified. An ultrasound image was saved procedural documentation purposes. After the overlying soft tissues were anesthetized, a small venotomy incision was created and a micropuncture kit was utilized to access the right external jugular vein. Real-time ultrasound guidance was utilized for vascular access including the acquisition of a permanent ultrasound image documenting patency of the accessed vessel. As there was some difficulty advancing the guidewire centrally, limited contrast injection was performed via the outer sheath of the micropuncture kit demonstrating tortuosity though patency of the SVC. A guidewire was advanced to the level of the superior caval-atrial junction for measurement purposes and the PICC line was cut to length. A peel-away sheath was placed and a 20 cm, 5 [place], dual lumen was inserted to level of the superior caval-atrial junction. A post procedure spot fluoroscopic was obtained. The catheter easily aspirated and flushed and was sutured in place. A dressing was placed. The patient tolerated the procedure well without immediate post procedural complication. FINDINGS: After catheter placement, the tip lies within the superior cavoatrial junction. The catheter aspirates and flushes normally and is ready for immediate use. IMPRESSION: Successful ultrasound and fluoroscopic guided placement of a right external jugular vein approach, 20 cm, 5 [place], dual lumen  PICC with tip at the superior caval-atrial junction. The PICC line is ready for immediate use. Electronically Signed   By: Sandi Mariscal M.D.   On: 08/27/2018 14:57   Medications:  . sodium chloride   Intravenous Once  . acetaminophen  650 mg Oral Q6H  . amiodarone  200 mg Oral Daily  . atorvastatin  80 mg Oral q1800  . Chlorhexidine Gluconate Cloth  6 each Topical Q0600  . darbepoetin (ARANESP) injection - DIALYSIS  100 mcg Intravenous Q Tue-HD  . feeding supplement (NEPRO CARB STEADY)  237 mL Oral BID BM  . fentaNYL      . HYDROmorphone   Intravenous Q4H  . lanthanum  1,000 mg Oral TID PC  . lidocaine      . midazolam      . midodrine  15 mg Oral TID WC  . multivitamin  1 tablet Oral QHS  . sodium chloride flush  10-40 mL Intracatheter Q12H    Dialysis Orders: TTS, [facility], 4 hours 45 minutes, left forearm AVF(15G), BFR 450, DFR 800, 2K/2.5 calcium. HeparinNONE. EDW 133.5kg. Hectorol2.43mg TIW, [place] 2.562mTIW/ epo 6600  Assessment/Plan: [age]ear old male patient with end-stage renal disease due to polycystic kidney disease who presented with a left perinephric hematoma.   1. Recurrent left perinephric hematoma. Status post 4 units of PRBCs. Followed by IR and urology.  S/p embolization today.   . 2. End-stage renal disease on hemodialysis. Continue Tuesday, Thursday, Saturday dialysis schedule - next HD tomorrow. Currently dialyzing without issue.  3. Chronic hypotension. Blood pressures remain on the low side. Continue midodrine. Has been limiting UF but does not appear volume overloaded  4. AVR. was On Coumadin ,INR 1.7 / plan per admit team  - off all anticoag at this time.  Coming up with plan for resumption.  5. Anemia. Secondary to end-stage renal disease and acute blood loss. He is now status post 5 units of packed RBCs. Continue Aranesp with dialysis. Ferritin is stable.  6. Secondary hyperparathyroidism. Hectorol on hold with corr ca >10 . On Fosrenol for phosphorus of 5.9 5/12.  Cont to monitor in light of poor po intake.  .  7. Nutrition. Continue  protein supplementation when possible   LiJannifer HickD 08/29/2018, 12:11 PM  [facility] Pager: [phone]

## 2018-08-29 NOTE — Procedures (Signed)
Pre-procedure Diagnosis: Hemorrhagic left kidney Post-procedure Diagnosis: Same  Post left sided renal embolization.    Complications: None Immediate  EBL: None  Keep right leg straight for 4 hrs (until 1500).    Signed: Sandi Mariscal Pager: [phone] 08/29/2018, 11:47 AM

## 2018-08-30 LAB — RENAL FUNCTION PANEL
Albumin: 1.4 g/dL — ABNORMAL LOW (ref 3.5–5.0)
Anion gap: 13 (ref 5–15)
BUN: 51 mg/dL — ABNORMAL HIGH (ref 6–20)
CO2: 26 mmol/L (ref 22–32)
Calcium: 8.1 mg/dL — ABNORMAL LOW (ref 8.9–10.3)
Chloride: 95 mmol/L — ABNORMAL LOW (ref 98–111)
Creatinine, Ser: 7.06 mg/dL — ABNORMAL HIGH (ref 0.61–1.24)
GFR calc Af Amer: 9 mL/min — ABNORMAL LOW (ref >60)
GFR calc non Af Amer: 8 mL/min — ABNORMAL LOW (ref >60)
Glucose, Bld: 62 mg/dL — ABNORMAL LOW (ref 70–99)
Phosphorus: 5.5 mg/dL — ABNORMAL HIGH (ref 2.5–4.6)
Potassium: 4 mmol/L (ref 3.5–5.1)
Sodium: 134 mmol/L — ABNORMAL LOW (ref 135–145)

## 2018-08-30 LAB — CBC
HCT: 23.4 % — ABNORMAL LOW (ref 39.0–52.0)
Hemoglobin: 7.2 g/dL — ABNORMAL LOW (ref 13.0–17.0)
MCH: 27.3 pg (ref 26.0–34.0)
MCHC: 30.8 g/dL (ref 30.0–36.0)
MCV: 88.6 fL (ref 80.0–100.0)
Platelets: 308 10*3/uL (ref 150–400)
RBC: 2.64 MIL/uL — ABNORMAL LOW (ref 4.22–5.81)
RDW: 17.7 % — ABNORMAL HIGH (ref 11.5–15.5)
WBC: 14 10*3/uL — ABNORMAL HIGH (ref 4.0–10.5)
nRBC: 0 % (ref 0.0–0.2)

## 2018-08-30 LAB — HEPARIN LEVEL (UNFRACTIONATED): Heparin Unfractionated: 0.26 IU/mL — ABNORMAL LOW (ref 0.30–0.70)

## 2018-08-30 LAB — PROTIME-INR
INR: 1.3 — ABNORMAL HIGH (ref 0.8–1.2)
Prothrombin Time: 16.4 seconds — ABNORMAL HIGH (ref 11.4–15.2)

## 2018-08-30 MED ORDER — WARFARIN - PHARMACIST DOSING INPATIENT
Freq: Every day | Status: DC
  Administered 2018-08-30 – 2018-09-02 (×3)

## 2018-08-30 MED ORDER — WARFARIN SODIUM 2 MG PO TABS
2.0000 mg | ORAL_TABLET | Freq: Once | ORAL | Status: AC
  Administered 2018-08-30: 2 mg via ORAL
  Filled 2018-08-30: qty 1

## 2018-08-30 MED ORDER — HYDROMORPHONE HCL 1 MG/ML IJ SOLN
1.0000 mg | INTRAMUSCULAR | Status: DC | PRN
  Administered 2018-08-30 – 2018-09-04 (×9): 1 mg via INTRAVENOUS
  Filled 2018-08-30 (×10): qty 1

## 2018-08-30 MED ORDER — HEPARIN (PORCINE) 25000 UT/250ML-% IV SOLN
2000.0000 [IU]/h | INTRAVENOUS | Status: DC
  Administered 2018-08-30: 1900 [IU]/h via INTRAVENOUS
  Administered 2018-08-31: 2000 [IU]/h via INTRAVENOUS
  Administered 2018-08-31 – 2018-09-01 (×2): 1950 [IU]/h via INTRAVENOUS
  Administered 2018-09-01: 2000 [IU]/h via INTRAVENOUS
  Filled 2018-08-30 (×6): qty 250

## 2018-08-30 NOTE — Progress Notes (Signed)
Patient ID: [name] [name], male   DOB: Aug 11, 1965, 53 y.o.   MRN: [id number]  IR Round Note via phone New regulations Spoke to [facility] RN   Left renal hemorrhage embolization in IR 5/13-- Dr Ronny Bacon  IMPRESSION: Technically successful percutaneous coil embolization of the left renal artery for diffuse left-sided renal hemorrhage secondary to polycystic kidney disease.  INR 1.3 this am Hg stable 7.2 No transfusion overnight per chart  In dialysis now Rt groin site looks c/d/i per RN No hematoma  She states pt says he slept well and feels better today

## 2018-08-30 NOTE — Progress Notes (Addendum)
   Subjective: No overnight events. Mr. Baranowski appears in better spirits this morning and is pleased with how the embolization went yesterday. He states he had some mild urethral bleeding yesterday evening, but it has now resolved. His says his pain is well-managed when he is lying down and has not had to use his PCA too frequently. He still does not have an appetite for hospital food, but states he will eat chick-fil-a when he leaves the hospital. He is having regular bowel movements.   Objective:  Vital signs in last 24 hours: Vitals:   08/29/18 1718 08/29/18 2036 08/29/18 2122 08/30/18 0539  BP:   101/66 94/62  Pulse:   86 92  Resp: (!) 24 18 20 16   Temp:   97.7 F (36.5 C) (!) 97.4 F (36.3 C)  TempSrc:   Oral Oral  SpO2: 100% 100% 100% 100%  Weight:   135 kg   Height:       Gen: alert and oriented, no distress CV: RRR, mechanical AVR click Abd: bs+, left side is distended compared to right, mild ttp Ext: no edema  Assessment/Plan:  Principal Problem:   Perinephric hematoma Active Problems:   ESRD (end stage renal disease) (HCC)   Morbid obesity (HCC)   Systolic CHF (HCC)   S/P aortic valve replacement with bileaflet mechanical valve   Acute blood loss anemia   Gross hematuria   Supratherapeutic INR  [name] [name] is a 53 yo man with CAD s/p CABG, CHF, ESRD on HD, polycystic kidney disease, and aortic valve replacement on Coumadin who presented withabdominalpain and bleeding from the penissecondary to an expanding left-sided perinephric hematoma from PCKD in the setting of supratherapeutic INR.He continued to have bleeding and fluctuating INR despite holding warfarin. Underwent transcatheter embolization of the left kidney 5/13.   L perinephric hematoma:  AVR on Coumadin Recovering well post-op. Pain controlled on dilaudid PCA. Has required7 blood transfusions since admission. Hb stable at 7.2 this am, INR 1.3. Will resume anticoagulation with warfarin today. INR goal  2-2.5 - Urology following, appreciate recs - IRfollowing, appreciate recs -Heparin bridge to resume warfarin per pharmacy - Trend INR -Pain control with dilaudid PCA, will taper as tolerates - Trend CBC  ESRD on HD TTS -HD per nephrology - Continue home fosrenol - Continue ESA per nephro for iron deficiency anemia in the setting of acute blood loss and CKD - Trend BMP   Hypotension:Chronic hypotension with MAPs 60-65.Asymptomatic.Blood pressurestable.Will fluid bolus and recheck Hb if pressures worsenor patient becomes symptomatic. - Continue home midodrine 15mg  TID -Discontinued homemetoprolol  Leukocytosis: Stable. No obvious signs of infection. May be caused bystress reaction orresorption of known hematoma.   Malnutrition: Poor PO intake since admission. Likely due to mass effect of perinephric hematoma. His appetite may improve post-procedure. If not, he may require further nutritional support. - Nutrition following - Nepro shakes and magic cup supplements  Dispo: Anticipated discharge when patient is therapeutic on warfarin without bleeding  Dorrell, [name], MD 08/30/2018, 6:37 AM Pager: [phone]

## 2018-08-30 NOTE — Progress Notes (Signed)
  Date: 08/30/2018  Patient name: [name] [name]  Medical record number: [id number]  Date of birth: 08-26-1965   I have seen and evaluated this patient and I have discussed the plan of care with the house staff. Please see their note for complete details. I concur with their findings with the following additions/corrections:   Successful embolization of left kidney yesterday. Appreciate IR assistance and ongoing nephrology and urology consultation. He seems much more energetic this morning. Pain is well controlled on PCA. Hgb ok at 7.2.  Will carefully resume warfarin with goal INR 2-2.5.   Continue to encourage oral intake. He is taking nepro shakes, but that's about it.   Lenice Pressman, M.D., Ph.D. 08/30/2018, 11:15 AM

## 2018-08-30 NOTE — Progress Notes (Signed)
[facility] Progress Note   Subjective:   S/p IR embolization yest AM with multiple areas active bleeding noted.  Post procedure pain noted to be significantly improved this AM.  O/w no complaints. No orthopnea.   Objective Vitals:   08/29/18 2122 08/30/18 0040 08/30/18 0453 08/30/18 0539  BP: 101/66   94/62  Pulse: 86   92  Resp: _0 Temp: 97.7 F (36.5 C)   (!) 97.4 F (36.3 C)  TempSrc: Oral   Oral  SpO2: 100% 100% 100% 100%  Weight: 135 kg     Height:       Physical Exam General: comfortably lying flat in bed Heart: RRR Lungs: clear to bases, normal WOB Abdomen: mildly TTP throughout Extremities: no edema Dialysis Access:  L FA AVF with thrill and bruit   Additional Objective Labs: Basic Metabolic Panel: Recent Labs  Lab 08/28/18 0326 08/29/18 0400 08/30/18 0501  NA 133* 135 134*  K 4.2 3.6 4.0  CL 91* 96* 95*  CO2 _1 GLUCOSE 104* 86 62*  BUN 65* 37* 51*  CREATININE 7.78* 5.66* 7.06*  CALCIUM 8.1* 8.3* 8.1*  PHOS 5.9* 4.4 5.5*   Liver Function Tests: Recent Labs  Lab 08/28/18 0326 08/29/18 0400 08/30/18 0501  ALBUMIN 1.4* 1.4* 1.4*   No results for input(s): LIPASE, AMYLASE in the last 168 hours. CBC: Recent Labs  Lab 08/26/18 1956 08/27/18 0639  08/28/18 0326 08/28/18 1840 08/29/18 0400 08/30/18 0501  WBC 15.3* 13.0*  --  15.5*  --  14.8* 14.0*  HGB 7.5* 6.8*   < > 6.8* 7.5* 7.3* 7.2*  HCT 23.6* 21.9*   < > 21.1* 23.3* 23.1* 23.4*  MCV 84.3 84.6  --  83.7  --  86.2 88.6  PLT 321 311  --  291  --  310 308   < > = values in this interval not displayed.   Blood Culture    Component Value Date/Time   SDES AORTIC VALVE TISSUE 09/01/2017 1220   SPECREQUEST NONE 09/01/2017 1220   CULT  09/01/2017 1220    No growth aerobically or anaerobically. Performed at [facility], [place] [address] [place], [place] [address]    REPTSTATUS 09/06/2017 FINAL 09/01/2017 1220    Cardiac Enzymes: No results for  input(s): CKTOTAL, CKMB, CKMBINDEX, TROPONINI in the last 168 hours. CBG: No results for input(s): GLUCAP in the last 168 hours. Iron Studies: No results for input(s): IRON, TIBC, TRANSFERRIN, FERRITIN in the last 72 hours. _2 @ Studies/Results: Ir Angiogram Renal Uni Selective  Result Date: 08/29/2018 INDICATION: History of polycystic kidney disease with end-stage renal disease, currently on dialysis. Patient recently admitted with symptomatic hemorrhagic left-sided renal cysts initially attributable to supratherapeutic INR. Unfortunately, despite continued conservative measures, the now diffuse left-sided renal hemorrhage has enlarged, and as such, request made by Dr. Alinda Money (Urology) for percutaneous embolization. EXAM: 1. ULTRASOUND GUIDANCE FOR ARTERIAL ACCESS 2. SELECTIVE LEFT RENAL ARTERIOGRAM AND PERCUTANEOUS COIL EMBOLIZATION 3. SUB SELECTIVE SEGMENTAL ARTERIOGRAM AND PERCUTANEOUS COIL EMBOLIZATION MEDICATIONS: None; the patient is admitted to the hospital receiving intravenous antibiotics. The antibiotic was administered within one hour of the procedure ANESTHESIA/SEDATION: Moderate (conscious) sedation was employed during this procedure. A total of Versed 2 mg and Fentanyl 100 mcg was administered intravenously. Moderate Sedation Time: 85 minutes. The patient's level of consciousness and vital signs were monitored continuously by radiology nursing throughout the procedure under my direct supervision. CONTRAST:  75 cc Isovue-300 FLUOROSCOPY TIME:  18 minutes, 42 seconds (9,480 mGy) COMPLICATIONS: None immediate. PROCEDURE: Informed consent was obtained from the patient following explanation of the procedure, risks, benefits and alternatives. The patient understands, agrees and consents for the procedure. All questions were addressed. A time out was performed prior to the initiation of the procedure. Maximal barrier sterile technique utilized including caps, mask, sterile gowns, sterile  gloves, large sterile drape, hand hygiene, and Betadine prep. The right femoral head was marked fluoroscopically. Under sterile conditions and local anesthesia, the right common femoral artery access was performed with a micropuncture needle. Under direct ultrasound guidance, the right common femoral was accessed with a micropuncture kit. An ultrasound image was saved for documentation purposes. This allowed for placement of a 5-French vascular sheath. A limited arteriogram was performed through the side arm of the sheath confirming appropriate access within the right common femoral artery. A 5 [place] Mickelson catheter was advanced to the level of the abdominal aorta where it was formed, back bled and flushed. The catheter was utilized to select the left renal artery and a selective left renal arteriogram was performed. Next, with the use of a fathom 14 microwire, a regular Renegade microcatheter was utilized to select a subsegmental branch vessel of the interpolar division of the left renal artery supplying an ill-defined area of contrast extravasation. Contrast injection confirmed appropriate positioning and the subsegmental artery was coil embolized with 2 overlapping 2 mm and 3 mm interlock coils. Efforts were made to cannulate several additional segmental branches of the left renal artery supplying additional sites of contrast extravasation however ultimately this was deemed unnecessary due to the diffuse hemorrhage involving the entirety of left kidney. As such, the distal aspect of the interpolar division of the left renal artery was percutaneously coil embolized with multiple overlapping 4 mm, 5 mm and 6 mm interlock coils with extension of the coil pack into the origins of both the superior and inferior polar branches. Microcatheter was removed and completion left renal arteriogram was performed via the [facility] catheter. Images were reviewed and the procedure was terminated. At this point, all wires,  catheters and sheaths were removed from the patient. Hemostasis was achieved at the right groin access site with manual compression. The patient tolerated the procedure well without immediate post procedural complication. FINDINGS: Selective left renal arteriogram demonstrates at least 3 ill-defined areas of active extravasation involving the superior, interpolar and inferior poles of the left kidney. Initially, a subsegmental branch of the interpolar segment of the left renal artery was selected and percutaneously coil embolized. Efforts were made to select the additional subsegmental arteries supplying the ill-defined areas of contrast extravasation within both the superior and inferior pole the left kidney, however ultimately this was deemed unnecessary given diffuse hemorrhage affecting the left kidney. As such, the entirety of the left renal artery was percutaneously coil embolized from the distal aspect of the interpolar division of the left renal artery (with extension of the coil pack into the origin's of both the superior and inferior polar branches) to the level of the mid/distal left main renal artery. Following percutaneous embolization there is near complete occlusion of the left renal artery without definitive area of persistent contrast extravasation. IMPRESSION: Technically successful percutaneous coil embolization of the left renal artery for diffuse left-sided renal hemorrhage secondary to polycystic kidney disease. PLAN: - The patient was given a PCA pump for pain control. - Recommend continued observation and resuscitative management as deemed appropriate by the providing clinical team. Electronically Signed   By:  Sandi Mariscal M.D.   On: 08/29/2018 15:11   Ir US Guide Vasc Access Right  Result Date: 08/29/2018 INDICATION: History of polycystic kidney disease with end-stage renal disease, currently on dialysis. Patient recently admitted with symptomatic hemorrhagic left-sided renal cysts  initially attributable to supratherapeutic INR. Unfortunately, despite continued conservative measures, the now diffuse left-sided renal hemorrhage has enlarged, and as such, request made by Dr. Alinda Money (Urology) for percutaneous embolization. EXAM: 1. ULTRASOUND GUIDANCE FOR ARTERIAL ACCESS 2. SELECTIVE LEFT RENAL ARTERIOGRAM AND PERCUTANEOUS COIL EMBOLIZATION 3. SUB SELECTIVE SEGMENTAL ARTERIOGRAM AND PERCUTANEOUS COIL EMBOLIZATION MEDICATIONS: None; the patient is admitted to the hospital receiving intravenous antibiotics. The antibiotic was administered within one hour of the procedure ANESTHESIA/SEDATION: Moderate (conscious) sedation was employed during this procedure. A total of Versed 2 mg and Fentanyl 100 mcg was administered intravenously. Moderate Sedation Time: 85 minutes. The patient's level of consciousness and vital signs were monitored continuously by radiology nursing throughout the procedure under my direct supervision. CONTRAST:  75 cc Isovue-300 FLUOROSCOPY TIME:  18 minutes, 42 seconds (6,789 mGy) COMPLICATIONS: None immediate. PROCEDURE: Informed consent was obtained from the patient following explanation of the procedure, risks, benefits and alternatives. The patient understands, agrees and consents for the procedure. All questions were addressed. A time out was performed prior to the initiation of the procedure. Maximal barrier sterile technique utilized including caps, mask, sterile gowns, sterile gloves, large sterile drape, hand hygiene, and Betadine prep. The right femoral head was marked fluoroscopically. Under sterile conditions and local anesthesia, the right common femoral artery access was performed with a micropuncture needle. Under direct ultrasound guidance, the right common femoral was accessed with a micropuncture kit. An ultrasound image was saved for documentation purposes. This allowed for placement of a 5-French vascular sheath. A limited arteriogram was performed through the  side arm of the sheath confirming appropriate access within the right common femoral artery. A 5 [place] Mickelson catheter was advanced to the level of the abdominal aorta where it was formed, back bled and flushed. The catheter was utilized to select the left renal artery and a selective left renal arteriogram was performed. Next, with the use of a fathom 14 microwire, a regular Renegade microcatheter was utilized to select a subsegmental branch vessel of the interpolar division of the left renal artery supplying an ill-defined area of contrast extravasation. Contrast injection confirmed appropriate positioning and the subsegmental artery was coil embolized with 2 overlapping 2 mm and 3 mm interlock coils. Efforts were made to cannulate several additional segmental branches of the left renal artery supplying additional sites of contrast extravasation however ultimately this was deemed unnecessary due to the diffuse hemorrhage involving the entirety of left kidney. As such, the distal aspect of the interpolar division of the left renal artery was percutaneously coil embolized with multiple overlapping 4 mm, 5 mm and 6 mm interlock coils with extension of the coil pack into the origins of both the superior and inferior polar branches. Microcatheter was removed and completion left renal arteriogram was performed via the [facility] catheter. Images were reviewed and the procedure was terminated. At this point, all wires, catheters and sheaths were removed from the patient. Hemostasis was achieved at the right groin access site with manual compression. The patient tolerated the procedure well without immediate post procedural complication. FINDINGS: Selective left renal arteriogram demonstrates at least 3 ill-defined areas of active extravasation involving the superior, interpolar and inferior poles of the left kidney. Initially, a subsegmental branch of the interpolar segment  of the left renal artery was selected and  percutaneously coil embolized. Efforts were made to select the additional subsegmental arteries supplying the ill-defined areas of contrast extravasation within both the superior and inferior pole the left kidney, however ultimately this was deemed unnecessary given diffuse hemorrhage affecting the left kidney. As such, the entirety of the left renal artery was percutaneously coil embolized from the distal aspect of the interpolar division of the left renal artery (with extension of the coil pack into the origin's of both the superior and inferior polar branches) to the level of the mid/distal left main renal artery. Following percutaneous embolization there is near complete occlusion of the left renal artery without definitive area of persistent contrast extravasation. IMPRESSION: Technically successful percutaneous coil embolization of the left renal artery for diffuse left-sided renal hemorrhage secondary to polycystic kidney disease. PLAN: - The patient was given a PCA pump for pain control. - Recommend continued observation and resuscitative management as deemed appropriate by the providing clinical team. Electronically Signed   By: Sandi Mariscal M.D.   On: 08/29/2018 15:11   Ir Embo Art  [facility] [employer] Hemorr Lymph [employer] Guide Roadmapping  Result Date: 08/29/2018 INDICATION: History of polycystic kidney disease with end-stage renal disease, currently on dialysis. Patient recently admitted with symptomatic hemorrhagic left-sided renal cysts initially attributable to supratherapeutic INR. Unfortunately, despite continued conservative measures, the now diffuse left-sided renal hemorrhage has enlarged, and as such, request made by Dr. Alinda Money (Urology) for percutaneous embolization. EXAM: 1. ULTRASOUND GUIDANCE FOR ARTERIAL ACCESS 2. SELECTIVE LEFT RENAL ARTERIOGRAM AND PERCUTANEOUS COIL EMBOLIZATION 3. SUB SELECTIVE SEGMENTAL ARTERIOGRAM AND PERCUTANEOUS COIL EMBOLIZATION MEDICATIONS: None; the patient is admitted to  the hospital receiving intravenous antibiotics. The antibiotic was administered within one hour of the procedure ANESTHESIA/SEDATION: Moderate (conscious) sedation was employed during this procedure. A total of Versed 2 mg and Fentanyl 100 mcg was administered intravenously. Moderate Sedation Time: 85 minutes. The patient's level of consciousness and vital signs were monitored continuously by radiology nursing throughout the procedure under my direct supervision. CONTRAST:  75 cc Isovue-300 FLUOROSCOPY TIME:  18 minutes, 42 seconds (6,295 mGy) COMPLICATIONS: None immediate. PROCEDURE: Informed consent was obtained from the patient following explanation of the procedure, risks, benefits and alternatives. The patient understands, agrees and consents for the procedure. All questions were addressed. A time out was performed prior to the initiation of the procedure. Maximal barrier sterile technique utilized including caps, mask, sterile gowns, sterile gloves, large sterile drape, hand hygiene, and Betadine prep. The right femoral head was marked fluoroscopically. Under sterile conditions and local anesthesia, the right common femoral artery access was performed with a micropuncture needle. Under direct ultrasound guidance, the right common femoral was accessed with a micropuncture kit. An ultrasound image was saved for documentation purposes. This allowed for placement of a 5-French vascular sheath. A limited arteriogram was performed through the side arm of the sheath confirming appropriate access within the right common femoral artery. A 5 [place] Mickelson catheter was advanced to the level of the abdominal aorta where it was formed, back bled and flushed. The catheter was utilized to select the left renal artery and a selective left renal arteriogram was performed. Next, with the use of a fathom 14 microwire, a regular Renegade microcatheter was utilized to select a subsegmental branch vessel of the interpolar  division of the left renal artery supplying an ill-defined area of contrast extravasation. Contrast injection confirmed appropriate positioning and the subsegmental artery was coil embolized with 2  overlapping 2 mm and 3 mm interlock coils. Efforts were made to cannulate several additional segmental branches of the left renal artery supplying additional sites of contrast extravasation however ultimately this was deemed unnecessary due to the diffuse hemorrhage involving the entirety of left kidney. As such, the distal aspect of the interpolar division of the left renal artery was percutaneously coil embolized with multiple overlapping 4 mm, 5 mm and 6 mm interlock coils with extension of the coil pack into the origins of both the superior and inferior polar branches. Microcatheter was removed and completion left renal arteriogram was performed via the [facility] catheter. Images were reviewed and the procedure was terminated. At this point, all wires, catheters and sheaths were removed from the patient. Hemostasis was achieved at the right groin access site with manual compression. The patient tolerated the procedure well without immediate post procedural complication. FINDINGS: Selective left renal arteriogram demonstrates at least 3 ill-defined areas of active extravasation involving the superior, interpolar and inferior poles of the left kidney. Initially, a subsegmental branch of the interpolar segment of the left renal artery was selected and percutaneously coil embolized. Efforts were made to select the additional subsegmental arteries supplying the ill-defined areas of contrast extravasation within both the superior and inferior pole the left kidney, however ultimately this was deemed unnecessary given diffuse hemorrhage affecting the left kidney. As such, the entirety of the left renal artery was percutaneously coil embolized from the distal aspect of the interpolar division of the left renal artery (with  extension of the coil pack into the origin's of both the superior and inferior polar branches) to the level of the mid/distal left main renal artery. Following percutaneous embolization there is near complete occlusion of the left renal artery without definitive area of persistent contrast extravasation. IMPRESSION: Technically successful percutaneous coil embolization of the left renal artery for diffuse left-sided renal hemorrhage secondary to polycystic kidney disease. PLAN: - The patient was given a PCA pump for pain control. - Recommend continued observation and resuscitative management as deemed appropriate by the providing clinical team. Electronically Signed   By: Sandi Mariscal M.D.   On: 08/29/2018 15:11   Medications:  . sodium chloride   Intravenous Once  . acetaminophen  650 mg Oral Q6H  . amiodarone  200 mg Oral Daily  . atorvastatin  80 mg Oral q1800  . Chlorhexidine Gluconate Cloth  6 each Topical Q0600  . darbepoetin (ARANESP) injection - DIALYSIS  100 mcg Intravenous Q Tue-HD  . feeding supplement (NEPRO CARB STEADY)  237 mL Oral BID BM  . HYDROmorphone   Intravenous Q4H  . lanthanum  1,000 mg Oral TID PC  . midodrine  15 mg Oral TID WC  . multivitamin  1 tablet Oral QHS  . sodium chloride flush  10-40 mL Intracatheter Q12H    Dialysis Orders: TTS, [facility], 4 hours 45 minutes, left forearm AVF(15G), BFR 450, DFR 800, 2K/2.5 calcium. HeparinNONE. EDW 133.5kg. Hectorol2.79mg TIW, [place] 2.570mTIW/ epo 6600  Assessment/Plan: [age]ear old male patient with end-stage renal disease due to polycystic kidney disease who presented with a left perinephric hematoma.   1. Recurrent left perinephric hematoma. Status post 5 units of PRBCs. Followed by IR and urology.  S/p embolization yesterday.  Hb stable yesterday to today at 7.2.  . 2. End-stage renal disease on hemodialysis. Continue Tuesday, Thursday, Saturday dialysis schedule - HD now. UF 2L as  tolerated  3. Chronic hypotension. Blood pressures remain on the low side. Continue midodrine.  Has been limiting UF but does not appear volume overloaded  4. AVR. was On Coumadin ,INR 1.7 / plan per admit team  - off all anticoag at this time.  Coming up with plan for resumption.  5. Anemia. Secondary to end-stage renal disease and acute blood loss. He is now status post 5 units of packed RBCs. Continue Aranesp with dialysis. Ferritin is stable.  6. Secondary hyperparathyroidism. Hectorol on hold with corr ca >10 . On Fosrenol for phosphorus of 5.5 5/14.  Cont to monitor in light of poor po intake.  .  7. Nutrition. Continue protein supplementation   Jannifer Hick MD 08/30/2018, 6:56 AM  [facility] Pager: [phone]

## 2018-08-30 NOTE — Progress Notes (Signed)
Patient ID: [name] [name], male   DOB: [birth year], 53 y.o.   MRN: [id number]    Subjective: Pt s/p left renal angioembolization yesterday by Dr. Pascal Lux.  Multiple active bleeding sites identified at time of procedure and segmental and main renal artery coils placed for embolization.  Pt with some blood per urethra this morning but his pain is much improved and no fever overnight.  Objective: Vital signs in last 24 hours: Temp:  [97.4 F (36.3 C)-98.3 F (36.8 C)] 97.4 F (36.3 C) (05/14 0539) Pulse Rate:  [86-93] 92 (05/14 0539) Resp:  [16-24] 16 (05/14 0539) BP: (94-114)/(62-81) 94/62 (05/14 0539) SpO2:  [91 %-100 %] 100 % (05/14 0539) Weight:  [135 kg] 135 kg (05/13 2122)  Intake/Output from previous day: 05/13 0701 - 05/14 0700 In: 240 [P.O.:240] Out: 0  Intake/Output this shift: No intake/output data recorded.  Physical Exam:  General: Alert and oriented Abd: Tender at left CVA and abdomen  Lab Results: Recent Labs    08/28/18 1840 08/29/18 0400 08/30/18 0501  HGB 7.5* 7.3* 7.2*  HCT 23.3* 23.1* 23.4*    BMET Recent Labs    08/29/18 0400 08/30/18 0501  NA 135 134*  K 3.6 4.0  CL 96* 95*  CO2 28 26  GLUCOSE 86 62*  BUN 37* 51*  CREATININE 5.66* 7.06*  CALCIUM 8.3* 8.1*     Studies/Results: Ir Angiogram Renal Uni Selective  Result Date: 08/29/2018 INDICATION: History of polycystic kidney disease with end-stage renal disease, currently on dialysis. Patient recently admitted with symptomatic hemorrhagic left-sided renal cysts initially attributable to supratherapeutic INR. Unfortunately, despite continued conservative measures, the now diffuse left-sided renal hemorrhage has enlarged, and as such, request made by Dr. Alinda Money (Urology) for percutaneous embolization. EXAM: 1. ULTRASOUND GUIDANCE FOR ARTERIAL ACCESS 2. SELECTIVE LEFT RENAL ARTERIOGRAM AND PERCUTANEOUS COIL EMBOLIZATION 3. SUB SELECTIVE SEGMENTAL ARTERIOGRAM AND PERCUTANEOUS COIL EMBOLIZATION  MEDICATIONS: None; the patient is admitted to the hospital receiving intravenous antibiotics. The antibiotic was administered within one hour of the procedure ANESTHESIA/SEDATION: Moderate (conscious) sedation was employed during this procedure. A total of Versed 2 mg and Fentanyl 100 mcg was administered intravenously. Moderate Sedation Time: 85 minutes. The patient's level of consciousness and vital signs were monitored continuously by radiology nursing throughout the procedure under my direct supervision. CONTRAST:  75 cc Isovue-300 FLUOROSCOPY TIME:  18 minutes, 42 seconds (1,791 mGy) COMPLICATIONS: None immediate. PROCEDURE: Informed consent was obtained from the patient following explanation of the procedure, risks, benefits and alternatives. The patient understands, agrees and consents for the procedure. All questions were addressed. A time out was performed prior to the initiation of the procedure. Maximal barrier sterile technique utilized including caps, mask, sterile gowns, sterile gloves, large sterile drape, hand hygiene, and Betadine prep. The right femoral head was marked fluoroscopically. Under sterile conditions and local anesthesia, the right common femoral artery access was performed with a micropuncture needle. Under direct ultrasound guidance, the right common femoral was accessed with a micropuncture kit. An ultrasound image was saved for documentation purposes. This allowed for placement of a 5-French vascular sheath. A limited arteriogram was performed through the side arm of the sheath confirming appropriate access within the right common femoral artery. A 5 [place] Mickelson catheter was advanced to the level of the abdominal aorta where it was formed, back bled and flushed. The catheter was utilized to select the left renal artery and a selective left renal arteriogram was performed. Next, with the use of a fathom 14 microwire,  a regular Renegade microcatheter was utilized to select a  subsegmental branch vessel of the interpolar division of the left renal artery supplying an ill-defined area of contrast extravasation. Contrast injection confirmed appropriate positioning and the subsegmental artery was coil embolized with 2 overlapping 2 mm and 3 mm interlock coils. Efforts were made to cannulate several additional segmental branches of the left renal artery supplying additional sites of contrast extravasation however ultimately this was deemed unnecessary due to the diffuse hemorrhage involving the entirety of left kidney. As such, the distal aspect of the interpolar division of the left renal artery was percutaneously coil embolized with multiple overlapping 4 mm, 5 mm and 6 mm interlock coils with extension of the coil pack into the origins of both the superior and inferior polar branches. Microcatheter was removed and completion left renal arteriogram was performed via the [facility] catheter. Images were reviewed and the procedure was terminated. At this point, all wires, catheters and sheaths were removed from the patient. Hemostasis was achieved at the right groin access site with manual compression. The patient tolerated the procedure well without immediate post procedural complication. FINDINGS: Selective left renal arteriogram demonstrates at least 3 ill-defined areas of active extravasation involving the superior, interpolar and inferior poles of the left kidney. Initially, a subsegmental branch of the interpolar segment of the left renal artery was selected and percutaneously coil embolized. Efforts were made to select the additional subsegmental arteries supplying the ill-defined areas of contrast extravasation within both the superior and inferior pole the left kidney, however ultimately this was deemed unnecessary given diffuse hemorrhage affecting the left kidney. As such, the entirety of the left renal artery was percutaneously coil embolized from the distal aspect of the  interpolar division of the left renal artery (with extension of the coil pack into the origin's of both the superior and inferior polar branches) to the level of the mid/distal left main renal artery. Following percutaneous embolization there is near complete occlusion of the left renal artery without definitive area of persistent contrast extravasation. IMPRESSION: Technically successful percutaneous coil embolization of the left renal artery for diffuse left-sided renal hemorrhage secondary to polycystic kidney disease. PLAN: - The patient was given a PCA pump for pain control. - Recommend continued observation and resuscitative management as deemed appropriate by the providing clinical team. Electronically Signed   By: Sandi Mariscal M.D.   On: 08/29/2018 15:11   Ir US Guide Vasc Access Right  Result Date: 08/29/2018 INDICATION: History of polycystic kidney disease with end-stage renal disease, currently on dialysis. Patient recently admitted with symptomatic hemorrhagic left-sided renal cysts initially attributable to supratherapeutic INR. Unfortunately, despite continued conservative measures, the now diffuse left-sided renal hemorrhage has enlarged, and as such, request made by Dr. Alinda Money (Urology) for percutaneous embolization. EXAM: 1. ULTRASOUND GUIDANCE FOR ARTERIAL ACCESS 2. SELECTIVE LEFT RENAL ARTERIOGRAM AND PERCUTANEOUS COIL EMBOLIZATION 3. SUB SELECTIVE SEGMENTAL ARTERIOGRAM AND PERCUTANEOUS COIL EMBOLIZATION MEDICATIONS: None; the patient is admitted to the hospital receiving intravenous antibiotics. The antibiotic was administered within one hour of the procedure ANESTHESIA/SEDATION: Moderate (conscious) sedation was employed during this procedure. A total of Versed 2 mg and Fentanyl 100 mcg was administered intravenously. Moderate Sedation Time: 85 minutes. The patient's level of consciousness and vital signs were monitored continuously by radiology nursing throughout the procedure under my  direct supervision. CONTRAST:  75 cc Isovue-300 FLUOROSCOPY TIME:  18 minutes, 42 seconds (0,947 mGy) COMPLICATIONS: None immediate. PROCEDURE: Informed consent was obtained from the patient following explanation of  the procedure, risks, benefits and alternatives. The patient understands, agrees and consents for the procedure. All questions were addressed. A time out was performed prior to the initiation of the procedure. Maximal barrier sterile technique utilized including caps, mask, sterile gowns, sterile gloves, large sterile drape, hand hygiene, and Betadine prep. The right femoral head was marked fluoroscopically. Under sterile conditions and local anesthesia, the right common femoral artery access was performed with a micropuncture needle. Under direct ultrasound guidance, the right common femoral was accessed with a micropuncture kit. An ultrasound image was saved for documentation purposes. This allowed for placement of a 5-French vascular sheath. A limited arteriogram was performed through the side arm of the sheath confirming appropriate access within the right common femoral artery. A 5 [place] Mickelson catheter was advanced to the level of the abdominal aorta where it was formed, back bled and flushed. The catheter was utilized to select the left renal artery and a selective left renal arteriogram was performed. Next, with the use of a fathom 14 microwire, a regular Renegade microcatheter was utilized to select a subsegmental branch vessel of the interpolar division of the left renal artery supplying an ill-defined area of contrast extravasation. Contrast injection confirmed appropriate positioning and the subsegmental artery was coil embolized with 2 overlapping 2 mm and 3 mm interlock coils. Efforts were made to cannulate several additional segmental branches of the left renal artery supplying additional sites of contrast extravasation however ultimately this was deemed unnecessary due to the diffuse  hemorrhage involving the entirety of left kidney. As such, the distal aspect of the interpolar division of the left renal artery was percutaneously coil embolized with multiple overlapping 4 mm, 5 mm and 6 mm interlock coils with extension of the coil pack into the origins of both the superior and inferior polar branches. Microcatheter was removed and completion left renal arteriogram was performed via the [facility] catheter. Images were reviewed and the procedure was terminated. At this point, all wires, catheters and sheaths were removed from the patient. Hemostasis was achieved at the right groin access site with manual compression. The patient tolerated the procedure well without immediate post procedural complication. FINDINGS: Selective left renal arteriogram demonstrates at least 3 ill-defined areas of active extravasation involving the superior, interpolar and inferior poles of the left kidney. Initially, a subsegmental branch of the interpolar segment of the left renal artery was selected and percutaneously coil embolized. Efforts were made to select the additional subsegmental arteries supplying the ill-defined areas of contrast extravasation within both the superior and inferior pole the left kidney, however ultimately this was deemed unnecessary given diffuse hemorrhage affecting the left kidney. As such, the entirety of the left renal artery was percutaneously coil embolized from the distal aspect of the interpolar division of the left renal artery (with extension of the coil pack into the origin's of both the superior and inferior polar branches) to the level of the mid/distal left main renal artery. Following percutaneous embolization there is near complete occlusion of the left renal artery without definitive area of persistent contrast extravasation. IMPRESSION: Technically successful percutaneous coil embolization of the left renal artery for diffuse left-sided renal hemorrhage secondary to  polycystic kidney disease. PLAN: - The patient was given a PCA pump for pain control. - Recommend continued observation and resuscitative management as deemed appropriate by the providing clinical team. Electronically Signed   By: Sandi Mariscal M.D.   On: 08/29/2018 15:11   Ir Embo Art  Lawson Fiscal Hemorr Lymph Netta Corrigan  [employer] Guide Roadmapping  Result Date: 08/29/2018 INDICATION: History of polycystic kidney disease with end-stage renal disease, currently on dialysis. Patient recently admitted with symptomatic hemorrhagic left-sided renal cysts initially attributable to supratherapeutic INR. Unfortunately, despite continued conservative measures, the now diffuse left-sided renal hemorrhage has enlarged, and as such, request made by Dr. Alinda Money (Urology) for percutaneous embolization. EXAM: 1. ULTRASOUND GUIDANCE FOR ARTERIAL ACCESS 2. SELECTIVE LEFT RENAL ARTERIOGRAM AND PERCUTANEOUS COIL EMBOLIZATION 3. SUB SELECTIVE SEGMENTAL ARTERIOGRAM AND PERCUTANEOUS COIL EMBOLIZATION MEDICATIONS: None; the patient is admitted to the hospital receiving intravenous antibiotics. The antibiotic was administered within one hour of the procedure ANESTHESIA/SEDATION: Moderate (conscious) sedation was employed during this procedure. A total of Versed 2 mg and Fentanyl 100 mcg was administered intravenously. Moderate Sedation Time: 85 minutes. The patient's level of consciousness and vital signs were monitored continuously by radiology nursing throughout the procedure under my direct supervision. CONTRAST:  75 cc Isovue-300 FLUOROSCOPY TIME:  18 minutes, 42 seconds (7,017 mGy) COMPLICATIONS: None immediate. PROCEDURE: Informed consent was obtained from the patient following explanation of the procedure, risks, benefits and alternatives. The patient understands, agrees and consents for the procedure. All questions were addressed. A time out was performed prior to the initiation of the procedure. Maximal barrier sterile technique utilized  including caps, mask, sterile gowns, sterile gloves, large sterile drape, hand hygiene, and Betadine prep. The right femoral head was marked fluoroscopically. Under sterile conditions and local anesthesia, the right common femoral artery access was performed with a micropuncture needle. Under direct ultrasound guidance, the right common femoral was accessed with a micropuncture kit. An ultrasound image was saved for documentation purposes. This allowed for placement of a 5-French vascular sheath. A limited arteriogram was performed through the side arm of the sheath confirming appropriate access within the right common femoral artery. A 5 [place] Mickelson catheter was advanced to the level of the abdominal aorta where it was formed, back bled and flushed. The catheter was utilized to select the left renal artery and a selective left renal arteriogram was performed. Next, with the use of a fathom 14 microwire, a regular Renegade microcatheter was utilized to select a subsegmental branch vessel of the interpolar division of the left renal artery supplying an ill-defined area of contrast extravasation. Contrast injection confirmed appropriate positioning and the subsegmental artery was coil embolized with 2 overlapping 2 mm and 3 mm interlock coils. Efforts were made to cannulate several additional segmental branches of the left renal artery supplying additional sites of contrast extravasation however ultimately this was deemed unnecessary due to the diffuse hemorrhage involving the entirety of left kidney. As such, the distal aspect of the interpolar division of the left renal artery was percutaneously coil embolized with multiple overlapping 4 mm, 5 mm and 6 mm interlock coils with extension of the coil pack into the origins of both the superior and inferior polar branches. Microcatheter was removed and completion left renal arteriogram was performed via the [facility] catheter. Images were reviewed and the procedure  was terminated. At this point, all wires, catheters and sheaths were removed from the patient. Hemostasis was achieved at the right groin access site with manual compression. The patient tolerated the procedure well without immediate post procedural complication. FINDINGS: Selective left renal arteriogram demonstrates at least 3 ill-defined areas of active extravasation involving the superior, interpolar and inferior poles of the left kidney. Initially, a subsegmental branch of the interpolar segment of the left renal artery was selected and percutaneously coil embolized. Efforts were made  to select the additional subsegmental arteries supplying the ill-defined areas of contrast extravasation within both the superior and inferior pole the left kidney, however ultimately this was deemed unnecessary given diffuse hemorrhage affecting the left kidney. As such, the entirety of the left renal artery was percutaneously coil embolized from the distal aspect of the interpolar division of the left renal artery (with extension of the coil pack into the origin's of both the superior and inferior polar branches) to the level of the mid/distal left main renal artery. Following percutaneous embolization there is near complete occlusion of the left renal artery without definitive area of persistent contrast extravasation. IMPRESSION: Technically successful percutaneous coil embolization of the left renal artery for diffuse left-sided renal hemorrhage secondary to polycystic kidney disease. PLAN: - The patient was given a PCA pump for pain control. - Recommend continued observation and resuscitative management as deemed appropriate by the providing clinical team. Electronically Signed   By: Sandi Mariscal M.D.   On: 08/29/2018 15:11    Assessment/Plan: Left perinephric hematoma: Successful angioembolization of left kidney yesterday.  Appreciate Dr. Hilbert Corrigan excellent care.  Hgb stable today and patient feeling improved.  Ok to  proceed with plans to reinstitute therapeutic oral anticoagulation.  If INR still difficult to regulate (has been elevated even off warfarin for an extended time period), would consider hematology evaluation.  Will plan to have patient follow up with urology in a few months for repeat imaging.  Will continue to follow for now.   LOS: 11 days   Dutch Gray 08/30/2018, 6:47 AM

## 2018-08-30 NOTE — Progress Notes (Signed)
PCA hydromorphone dilaudid was discontinued. This RN Wasted 58ml in stericycle witnessed by Lorre Nick.

## 2018-08-30 NOTE — Progress Notes (Signed)
[place] for heparin/warfarin Indication: Mechanical valve  No Known Allergies  Patient Measurements: Height: 6\' 1"  (185.4 cm) Weight: 291 lb 7.2 oz (132.2 kg) IBW/kg (Calculated) : 79.9  Heparin dosing wt:  109.5 kg  Vital Signs: Temp: 97.8 F (36.6 C) (05/14 1647) Temp Source: Oral (05/14 1647) BP: 93/55 (05/14 1647) Pulse Rate: 106 (05/14 1647)  Labs: Recent Labs    08/28/18 0326  08/28/18 1840 08/29/18 0400 08/29/18 0732 08/30/18 0501 08/30/18 1826  HGB 6.8*  --  7.5* 7.3*  --  7.2*  --   HCT 21.1*  --  23.3* 23.1*  --  23.4*  --   PLT 291  --   --  310  --  308  --   LABPROT 24.8*   < > 21.6* 18.6* 17.3* 16.4*  --   INR 2.3*   < > 1.9* 1.6* 1.4* 1.3*  --   HEPARINUNFRC  --   --   --   --   --   --  0.26*  CREATININE 7.78*  --   --  5.66*  --  7.06*  --    < > = values in this interval not displayed.    Estimated Creatinine Clearance: 17.3 mL/min (A) (by C-G formula based on SCr of 7.06 mg/dL (H)).   Medical History: Past Medical History:  Diagnosis Date  . Aneurysm ([place])     Right arm fistula 3 aneurysms 2011,   plans to have a new procedure  . Angina   . Chest discomfort   . CHF (congestive heart failure) ([place])   . Coronary artery disease   . Coronary artery disease involving native coronary artery of native heart without angina pectoris   . Dialysis patient ([place])   . Dysrhythmia   . ESRD (end stage renal disease) ([place])    on hemodialysis T_T_S  . Hypertension   . Leg pain   . Mitral regurgitation   . Morbid obesity ([place])   . Orthostatic hypotension   . Overweight(278.02)   . Renal insufficiency   . 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 Severe aortic insufficiency   . Sinus tachycardia   . Syncope     positional after dialysis... 2008   Assessment: 51 YOM on warfarin  PTA for mech aortic valve with subtherapeutic INR started on heparin with no bolus and tight goals d/t bleed risk with current hematuria.   Last dose warfarin was on 5/2 and patient is s/p 8 mg Vitamin K. INR goal has been decreased to 2-2.5  5/14: Patient now s/p embolization. Heparin/warfarin resumed today. Goal Heparin level will be 0.3-0.5 and INR 2-2.5. Will start conservatively on warfarin. Patient is s/p vitamin K and INR today is 1.3. Heparin level ~5.5 hrs after restarting infusion was 0.25 units/ml, which is below the goal range.   Goal of Therapy:  Heparin level 0.3-0.5 units/ml  INR 2-2.5 Monitor platelets by anticoagulation protocol: Yes   Plan:  Increase heparin infusion to 2000 units/hr (patient had therapeutic heparin levels on this rate previously during this admission) Heparin level in 8 hours (ESRD pt) Restart warfarin at 2mg  x 1 tonight Daily INR, HL and CBC  Gillermina Hu, PharmD, BCPS, [place] Please utilize Amion for appropriate phone number to reach the unit pharmacist ([place])  08/30/2018 7:15 PM

## 2018-08-30 NOTE — Progress Notes (Signed)
[place] for heparin/warfarin Indication: Mechanical valve  No Known Allergies  Patient Measurements: Height: 6\' 1"  (185.4 cm) Weight: 299 lb 6.2 oz (135.8 kg) IBW/kg (Calculated) : 79.9  Vital Signs: Temp: 97.7 F (36.5 C) (05/14 0707) Temp Source: Oral (05/14 0707) BP: 96/60 (05/14 0900) Pulse Rate: 96 (05/14 0900)  Labs: Recent Labs    08/28/18 0326  08/28/18 1840 08/29/18 0400 08/29/18 0732 08/30/18 0501  HGB 6.8*  --  7.5* 7.3*  --  7.2*  HCT 21.1*  --  23.3* 23.1*  --  23.4*  PLT 291  --   --  310  --  308  LABPROT 24.8*   < > 21.6* 18.6* 17.3* 16.4*  INR 2.3*   < > 1.9* 1.6* 1.4* 1.3*  CREATININE 7.78*  --   --  5.66*  --  7.06*   < > = values in this interval not displayed.    Estimated Creatinine Clearance: 17.5 mL/min (A) (by C-G formula based on SCr of 7.06 mg/dL (H)).   Medical History: Past Medical History:  Diagnosis Date  . Aneurysm ([place])     Right arm fistula 3 aneurysms 2011,   plans to have a new procedure  . Angina   . Chest discomfort   . CHF (congestive heart failure) ([place])   . Coronary artery disease   . Coronary artery disease involving native coronary artery of native heart without angina pectoris   . Dialysis patient ([place])   . Dysrhythmia   . ESRD (end stage renal disease) ([place])    on hemodialysis T_T_S  . Hypertension   . Leg pain   . Mitral regurgitation   . Morbid obesity ([place])   . Orthostatic hypotension   . Overweight(278.02)   . Renal insufficiency   . 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 Severe aortic insufficiency   . Sinus tachycardia   . Syncope     positional after dialysis... 2008   Assessment: 7 YOM on warfarin PTA for mech aortic valve with subtherapeutic INR started on heparin with no bolus and tight goals d/t bleed risk with current  hematuria.   Last dose warfarin was on 5/2 and patient is s/p 8 mg Vitamin K. INR goal has been decreased to 2-2.5  5/14: Patient now s/p embolization. D/W MD, patient to have heparin/warfarin resumed today. Goal Heparin level will be 0.3-0.5 and INR 2-2.5. Will start conservatively on warfarin. Patient is s/p vitamin K and INR today is 1.3  Goal of Therapy:  Heparin level 0.3-0.5 units/ml  INR 2-2.5 Monitor platelets by anticoagulation protocol: Yes   Plan:  Restart heparin at 1900 units/hr, no bolus after HD Heparin level in 6 hours Restart warfarin at 2mg  x 1 tonight Daily INR, HL and CBC  Wilburn Keir A. Levada Dy, PharmD, [place] Please utilize Amion for appropriate phone number to reach the unit pharmacist ([place])    08/30/2018 9:55 AM

## 2018-08-31 LAB — CBC
HCT: 24.5 % — ABNORMAL LOW (ref 39.0–52.0)
Hemoglobin: 7.3 g/dL — ABNORMAL LOW (ref 13.0–17.0)
MCH: 26.5 pg (ref 26.0–34.0)
MCHC: 29.8 g/dL — ABNORMAL LOW (ref 30.0–36.0)
MCV: 89.1 fL (ref 80.0–100.0)
Platelets: 320 10*3/uL (ref 150–400)
RBC: 2.75 MIL/uL — ABNORMAL LOW (ref 4.22–5.81)
RDW: 17.8 % — ABNORMAL HIGH (ref 11.5–15.5)
WBC: 17.4 10*3/uL — ABNORMAL HIGH (ref 4.0–10.5)
nRBC: 0 % (ref 0.0–0.2)

## 2018-08-31 LAB — PROTIME-INR
INR: 1.5 — ABNORMAL HIGH (ref 0.8–1.2)
Prothrombin Time: 17.4 seconds — ABNORMAL HIGH (ref 11.4–15.2)

## 2018-08-31 LAB — HEPARIN LEVEL (UNFRACTIONATED)
Heparin Unfractionated: 0.34 IU/mL (ref 0.30–0.70)
Heparin Unfractionated: 0.62 IU/mL (ref 0.30–0.70)
Heparin Unfractionated: 0.34 IU/mL (ref 0.30–0.70)

## 2018-08-31 MED ORDER — PRO-STAT SUGAR FREE PO LIQD
30.0000 mL | Freq: Three times a day (TID) | ORAL | Status: DC
  Administered 2018-08-31 – 2018-09-03 (×6): 30 mL via ORAL
  Filled 2018-08-31 (×7): qty 30

## 2018-08-31 MED ORDER — CHLORHEXIDINE GLUCONATE CLOTH 2 % EX PADS
6.0000 | MEDICATED_PAD | Freq: Every day | CUTANEOUS | Status: DC
  Administered 2018-08-31 – 2018-09-02 (×3): 6 via TOPICAL

## 2018-08-31 MED ORDER — WARFARIN SODIUM 2 MG PO TABS
2.0000 mg | ORAL_TABLET | Freq: Once | ORAL | Status: AC
  Administered 2018-08-31: 2 mg via ORAL
  Filled 2018-08-31: qty 1

## 2018-08-31 NOTE — Evaluation (Signed)
Physical Therapy Evaluation Patient Details Name: [name] [name] MRN: [id number] DOB: [birth year] Today's Date: 08/31/2018   History of Present Illness  Mr. Loren is a 53 yo man with CAD s/p CABG, CHF, ESRD on HD, polycystic kidney disease, and aortic valve replacement on Coumadin who presented with abdominal pain and bleeding from the penis secondary to an expanding left-sided perinephric hematoma from PCKD in the setting of supratherapeutic INR. He continued to have bleeding and fluctuating INR despite holding warfarin. Underwent transcatheter embolization of the left kidney 5/13.     Clinical Impression  Pt admitted with above diagnosis. Pt currently with functional limitations due to the deficits listed below (see PT Problem List). PTA, pt living at home with wife and 2 children, ambulatory without AD, reports 1-3 falls over last 6 months when he "blacks out" going to HD. Today, main complaint of stomach pain limiting his OOB mobility. Min A to come to sitting with pain requesting to return supine. Pt wishes to return home when able, will progress mobility and update recs if needed.   Pt will benefit from skilled PT to increase their independence and safety with mobility to allow discharge to the venue listed below.       Follow Up Recommendations Home health PT;Supervision/Assistance - 24 hour(Pending OOB progress)    Equipment Recommendations  (TBD)    Recommendations for Other Services OT consult     Precautions / Restrictions Precautions Precautions: Fall Restrictions Weight Bearing Restrictions: No      Mobility  Bed Mobility Overal bed mobility: Needs Assistance Bed Mobility: Supine to Sit     Supine to sit: Min assist     General bed mobility comments: min A to come to sitting   Transfers                 General transfer comment: declining due to pain  Ambulation/Gait                Stairs            Wheelchair Mobility    Modified Rankin  (Stroke Patients Only)       Balance Overall balance assessment: Needs assistance   Sitting balance-Leahy Scale: Fair                                       Pertinent Vitals/Pain Pain Assessment: No/denies pain    Home Living Family/patient expects to be discharged to:: Private residence Living Arrangements: Spouse/significant other Available Help at Discharge: Family;Available 24 hours/day Type of Home: House Home Access: Stairs to enter Entrance Stairs-Rails: Can reach both Entrance Stairs-Number of Steps: 6 Home Layout: One level Home Equipment: Walker - 2 wheels;Shower seat;Wheelchair - manual      Prior Function Level of Independence: Independent         Comments: does report hx of falls over last 6 months     Hand Dominance        Extremity/Trunk Assessment   Upper Extremity Assessment Upper Extremity Assessment: Generalized weakness    Lower Extremity Assessment Lower Extremity Assessment: Generalized weakness       Communication   Communication: No difficulties  Cognition Arousal/Alertness: Awake/alert Behavior During Therapy: WFL for tasks assessed/performed Overall Cognitive Status: Within Functional Limits for tasks assessed  General Comments      Exercises     Assessment/Plan    PT Assessment Patient needs continued PT services  PT Problem List Decreased strength       PT Treatment Interventions DME instruction    PT Goals (Current goals can be found in the Care Plan section)  Acute Rehab PT Goals Patient Stated Goal: go home when able PT Goal Formulation: With patient Time For Goal Achievement: 09/14/18 Potential to Achieve Goals: Good    Frequency Min 3X/week   Barriers to discharge        Co-evaluation               AM-PAC PT "6 Clicks" Mobility  Outcome Measure Help needed turning from your back to your side while in a flat bed without  using bedrails?: A Little Help needed moving from lying on your back to sitting on the side of a flat bed without using bedrails?: A Little Help needed moving to and from a bed to a chair (including a wheelchair)?: A Lot Help needed standing up from a chair using your arms (e.g., wheelchair or bedside chair)?: A Lot Help needed to walk in hospital room?: A Lot Help needed climbing 3-5 steps with a railing? : A Lot 6 Click Score: 14    End of Session Equipment Utilized During Treatment: Gait belt Activity Tolerance: Patient tolerated treatment well Patient left: in bed Nurse Communication: Mobility status PT Visit Diagnosis: Unsteadiness on feet (R26.81)    Time: 1500-1520 PT Time Calculation (min) (ACUTE ONLY): 20 min   Charges:   PT Evaluation $PT Eval Moderate Complexity: 1 Mod         Reinaldo Berber, PT, DPT Acute Rehabilitation Services Pager: [phone] Office: [place] 08/31/2018, 3:54 PM

## 2018-08-31 NOTE — Progress Notes (Addendum)
[place] for heparin/warfarin Indication: Mechanical valve  No Known Allergies  Patient Measurements: Height: 6\' 1"  (185.4 cm) Weight: 288 lb 5.8 oz (130.8 kg) IBW/kg (Calculated) : 79.9  Vital Signs: Temp: 98.8 F (37.1 C) (05/15 0414) Temp Source: Oral (05/15 0414) BP: 99/61 (05/15 0414) Pulse Rate: 101 (05/15 0414)  Labs: Recent Labs    08/29/18 0400 08/29/18 0732 08/30/18 0501 08/30/18 1826 08/31/18 0309  HGB 7.3*  --  7.2*  --  7.3*  HCT 23.1*  --  23.4*  --  24.5*  PLT 310  --  308  --  320  LABPROT 18.6* 17.3* 16.4*  --  17.4*  INR 1.6* 1.4* 1.3*  --  1.5*  HEPARINUNFRC  --   --   --  0.26* 0.34  CREATININE 5.66*  --  7.06*  --   --     Estimated Creatinine Clearance: 17.2 mL/min (A) (by C-G formula based on SCr of 7.06 mg/dL (H)).   Medical History: Past Medical History:  Diagnosis Date  . Aneurysm ([place])     Right arm fistula 3 aneurysms 2011,   plans to have a new procedure  . Angina   . Chest discomfort   . CHF (congestive heart failure) ([place])   . Coronary artery disease   . Coronary artery disease involving native coronary artery of native heart without angina pectoris   . Dialysis patient ([place])   . Dysrhythmia   . ESRD (end stage renal disease) ([place])    on hemodialysis T_T_S  . Hypertension   . Leg pain   . Mitral regurgitation   . Morbid obesity ([place])   . Orthostatic hypotension   . Overweight(278.02)   . Renal insufficiency   . 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 Severe aortic insufficiency   . Sinus tachycardia   . Syncope     positional after dialysis... 2008   Assessment: 62 YOM on warfarin PTA for mech aortic valve with subtherapeutic INR started on heparin with no bolus and tight goals d/t bleed risk with current hematuria.   Last dose warfarin was on 5/2  and patient is s/p 8 mg Vitamin K. INR goal has been decreased to 2-2.5  Patient now s/p embolization. Goal Heparin level will be 0.3-0.5 and INR 2-2.5. Will start conservatively on warfarin. INR 1.3>>1.5, HL 0.34 (therapeutic)  Goal of Therapy:  Heparin level 0.3-0.5 units/ml  INR 2-2.5 Monitor platelets by anticoagulation protocol: Yes   Plan:  Continue heparin at 2000 units/hr Heparin level in 6 hours Continue warfarin at 2mg  x 1 tonight Daily INR, HL and CBC  Eula Jaster A. Levada Dy, PharmD, [place] Please utilize Amion for appropriate phone number to reach the unit pharmacist ([place])  Addendum:   Confirmatory heparin level 0.62 this AM. This is over goal of 0.3-0.5. Will decrease to 1950 units/hr. Heparin level in 6 hours  Temperence Zenor A. Levada Dy, PharmD, [place] Pager: [phone] Please utilize Amion for appropriate phone number to reach the unit pharmacist ([place])    08/31/2018 7:39 AM

## 2018-08-31 NOTE — Progress Notes (Signed)
[facility] Progress Note   Subjective:   Doing well s/p IR embolization 5/13 AM with multiple areas active bleeding noted.  Tol HD yesterday well but felt really fatigued after. UF 2L.   Objective Vitals:   08/30/18 1203 08/30/18 1647 08/30/18 2227 08/31/18 0414  BP: (!) 93/59 (!) 93/55 (!) 97/46 99/61  Pulse: 98 (!) 106 (!) 110 (!) 101  Resp: '16 20 17 17  '$ Temp: 98 F (36.7 C) 97.8 F (36.6 C) 98.2 F (36.8 C) 98.8 F (37.1 C)  TempSrc: Oral Oral Oral Oral  SpO2: 100% 94% 94% 97%  Weight: 132.2 kg  130.8 kg   Height:       Physical Exam General: comfortably lying flat in bed Heart: RRR Lungs: clear to bases, normal WOB Abdomen: minimally TTP throughout Extremities: no edema Dialysis Access:  L FA AVF with thrill and bruit   Additional Objective Labs: Basic Metabolic Panel: Recent Labs  Lab 08/28/18 0326 08/29/18 0400 08/30/18 0501  NA 133* 135 134*  K 4.2 3.6 4.0  CL 91* 96* 95*  CO2 '26 28 26  '$ GLUCOSE 104* 86 62*  BUN 65* 37* 51*  CREATININE 7.78* 5.66* 7.06*  CALCIUM 8.1* 8.3* 8.1*  PHOS 5.9* 4.4 5.5*   Liver Function Tests: Recent Labs  Lab 08/28/18 0326 08/29/18 0400 08/30/18 0501  ALBUMIN 1.4* 1.4* 1.4*   No results for input(s): LIPASE, AMYLASE in the last 168 hours. CBC: Recent Labs  Lab 08/27/18 0639  08/28/18 0326  08/29/18 0400 08/30/18 0501 08/31/18 0309  WBC 13.0*  --  15.5*  --  14.8* 14.0* 17.4*  HGB 6.8*   < > 6.8*   < > 7.3* 7.2* 7.3*  HCT 21.9*   < > 21.1*   < > 23.1* 23.4* 24.5*  MCV 84.6  --  83.7  --  86.2 88.6 89.1  PLT 311  --  291  --  310 308 320   < > = values in this interval not displayed.   Blood Culture    Component Value Date/Time   SDES AORTIC VALVE TISSUE 09/01/2017 1220   SPECREQUEST NONE 09/01/2017 1220   CULT  09/01/2017 1220    No growth aerobically or anaerobically. Performed at [facility], [place] [address], [place], [place] [address]    REPTSTATUS 09/06/2017 FINAL 09/01/2017  1220    Cardiac Enzymes: No results for input(s): CKTOTAL, CKMB, CKMBINDEX, TROPONINI in the last 168 hours. CBG: No results for input(s): GLUCAP in the last 168 hours. Iron Studies: No results for input(s): IRON, TIBC, TRANSFERRIN, FERRITIN in the last 72 hours. '@lablastinr3'$ @ Studies/Results: Ir Angiogram Renal Uni Selective  Result Date: 08/29/2018 INDICATION: History of polycystic kidney disease with end-stage renal disease, currently on dialysis. Patient recently admitted with symptomatic hemorrhagic left-sided renal cysts initially attributable to supratherapeutic INR. Unfortunately, despite continued conservative measures, the now diffuse left-sided renal hemorrhage has enlarged, and as such, request made by Dr. Alinda Money (Urology) for percutaneous embolization. EXAM: 1. ULTRASOUND GUIDANCE FOR ARTERIAL ACCESS 2. SELECTIVE LEFT RENAL ARTERIOGRAM AND PERCUTANEOUS COIL EMBOLIZATION 3. SUB SELECTIVE SEGMENTAL ARTERIOGRAM AND PERCUTANEOUS COIL EMBOLIZATION MEDICATIONS: None; the patient is admitted to the hospital receiving intravenous antibiotics. The antibiotic was administered within one hour of the procedure ANESTHESIA/SEDATION: Moderate (conscious) sedation was employed during this procedure. A total of Versed 2 mg and Fentanyl 100 mcg was administered intravenously. Moderate Sedation Time: 85 minutes. The patient's level of consciousness and vital signs were monitored continuously by radiology nursing  throughout the procedure under my direct supervision. CONTRAST:  75 cc Isovue-300 FLUOROSCOPY TIME:  18 minutes, 42 seconds (4,696 mGy) COMPLICATIONS: None immediate. PROCEDURE: Informed consent was obtained from the patient following explanation of the procedure, risks, benefits and alternatives. The patient understands, agrees and consents for the procedure. All questions were addressed. A time out was performed prior to the initiation of the procedure. Maximal barrier sterile technique utilized  including caps, mask, sterile gowns, sterile gloves, large sterile drape, hand hygiene, and Betadine prep. The right femoral head was marked fluoroscopically. Under sterile conditions and local anesthesia, the right common femoral artery access was performed with a micropuncture needle. Under direct ultrasound guidance, the right common femoral was accessed with a micropuncture kit. An ultrasound image was saved for documentation purposes. This allowed for placement of a 5-French vascular sheath. A limited arteriogram was performed through the side arm of the sheath confirming appropriate access within the right common femoral artery. A 5 [place] Mickelson catheter was advanced to the level of the abdominal aorta where it was formed, back bled and flushed. The catheter was utilized to select the left renal artery and a selective left renal arteriogram was performed. Next, with the use of a fathom 14 microwire, a regular Renegade microcatheter was utilized to select a subsegmental branch vessel of the interpolar division of the left renal artery supplying an ill-defined area of contrast extravasation. Contrast injection confirmed appropriate positioning and the subsegmental artery was coil embolized with 2 overlapping 2 mm and 3 mm interlock coils. Efforts were made to cannulate several additional segmental branches of the left renal artery supplying additional sites of contrast extravasation however ultimately this was deemed unnecessary due to the diffuse hemorrhage involving the entirety of left kidney. As such, the distal aspect of the interpolar division of the left renal artery was percutaneously coil embolized with multiple overlapping 4 mm, 5 mm and 6 mm interlock coils with extension of the coil pack into the origins of both the superior and inferior polar branches. Microcatheter was removed and completion left renal arteriogram was performed via the [facility] catheter. Images were reviewed and the procedure  was terminated. At this point, all wires, catheters and sheaths were removed from the patient. Hemostasis was achieved at the right groin access site with manual compression. The patient tolerated the procedure well without immediate post procedural complication. FINDINGS: Selective left renal arteriogram demonstrates at least 3 ill-defined areas of active extravasation involving the superior, interpolar and inferior poles of the left kidney. Initially, a subsegmental branch of the interpolar segment of the left renal artery was selected and percutaneously coil embolized. Efforts were made to select the additional subsegmental arteries supplying the ill-defined areas of contrast extravasation within both the superior and inferior pole the left kidney, however ultimately this was deemed unnecessary given diffuse hemorrhage affecting the left kidney. As such, the entirety of the left renal artery was percutaneously coil embolized from the distal aspect of the interpolar division of the left renal artery (with extension of the coil pack into the origin's of both the superior and inferior polar branches) to the level of the mid/distal left main renal artery. Following percutaneous embolization there is near complete occlusion of the left renal artery without definitive area of persistent contrast extravasation. IMPRESSION: Technically successful percutaneous coil embolization of the left renal artery for diffuse left-sided renal hemorrhage secondary to polycystic kidney disease. PLAN: - The patient was given a PCA pump for pain control. - Recommend continued observation and  resuscitative management as deemed appropriate by the providing clinical team. Electronically Signed   By: Sandi Mariscal M.D.   On: 08/29/2018 15:11   Ir US Guide Vasc Access Right  Result Date: 08/29/2018 INDICATION: History of polycystic kidney disease with end-stage renal disease, currently on dialysis. Patient recently admitted with symptomatic  hemorrhagic left-sided renal cysts initially attributable to supratherapeutic INR. Unfortunately, despite continued conservative measures, the now diffuse left-sided renal hemorrhage has enlarged, and as such, request made by Dr. Alinda Money (Urology) for percutaneous embolization. EXAM: 1. ULTRASOUND GUIDANCE FOR ARTERIAL ACCESS 2. SELECTIVE LEFT RENAL ARTERIOGRAM AND PERCUTANEOUS COIL EMBOLIZATION 3. SUB SELECTIVE SEGMENTAL ARTERIOGRAM AND PERCUTANEOUS COIL EMBOLIZATION MEDICATIONS: None; the patient is admitted to the hospital receiving intravenous antibiotics. The antibiotic was administered within one hour of the procedure ANESTHESIA/SEDATION: Moderate (conscious) sedation was employed during this procedure. A total of Versed 2 mg and Fentanyl 100 mcg was administered intravenously. Moderate Sedation Time: 85 minutes. The patient's level of consciousness and vital signs were monitored continuously by radiology nursing throughout the procedure under my direct supervision. CONTRAST:  75 cc Isovue-300 FLUOROSCOPY TIME:  18 minutes, 42 seconds (0,962 mGy) COMPLICATIONS: None immediate. PROCEDURE: Informed consent was obtained from the patient following explanation of the procedure, risks, benefits and alternatives. The patient understands, agrees and consents for the procedure. All questions were addressed. A time out was performed prior to the initiation of the procedure. Maximal barrier sterile technique utilized including caps, mask, sterile gowns, sterile gloves, large sterile drape, hand hygiene, and Betadine prep. The right femoral head was marked fluoroscopically. Under sterile conditions and local anesthesia, the right common femoral artery access was performed with a micropuncture needle. Under direct ultrasound guidance, the right common femoral was accessed with a micropuncture kit. An ultrasound image was saved for documentation purposes. This allowed for placement of a 5-French vascular sheath. A limited  arteriogram was performed through the side arm of the sheath confirming appropriate access within the right common femoral artery. A 5 [place] Mickelson catheter was advanced to the level of the abdominal aorta where it was formed, back bled and flushed. The catheter was utilized to select the left renal artery and a selective left renal arteriogram was performed. Next, with the use of a fathom 14 microwire, a regular Renegade microcatheter was utilized to select a subsegmental branch vessel of the interpolar division of the left renal artery supplying an ill-defined area of contrast extravasation. Contrast injection confirmed appropriate positioning and the subsegmental artery was coil embolized with 2 overlapping 2 mm and 3 mm interlock coils. Efforts were made to cannulate several additional segmental branches of the left renal artery supplying additional sites of contrast extravasation however ultimately this was deemed unnecessary due to the diffuse hemorrhage involving the entirety of left kidney. As such, the distal aspect of the interpolar division of the left renal artery was percutaneously coil embolized with multiple overlapping 4 mm, 5 mm and 6 mm interlock coils with extension of the coil pack into the origins of both the superior and inferior polar branches. Microcatheter was removed and completion left renal arteriogram was performed via the [facility] catheter. Images were reviewed and the procedure was terminated. At this point, all wires, catheters and sheaths were removed from the patient. Hemostasis was achieved at the right groin access site with manual compression. The patient tolerated the procedure well without immediate post procedural complication. FINDINGS: Selective left renal arteriogram demonstrates at least 3 ill-defined areas of active extravasation involving the superior, interpolar  and inferior poles of the left kidney. Initially, a subsegmental branch of the interpolar segment of the  left renal artery was selected and percutaneously coil embolized. Efforts were made to select the additional subsegmental arteries supplying the ill-defined areas of contrast extravasation within both the superior and inferior pole the left kidney, however ultimately this was deemed unnecessary given diffuse hemorrhage affecting the left kidney. As such, the entirety of the left renal artery was percutaneously coil embolized from the distal aspect of the interpolar division of the left renal artery (with extension of the coil pack into the origin's of both the superior and inferior polar branches) to the level of the mid/distal left main renal artery. Following percutaneous embolization there is near complete occlusion of the left renal artery without definitive area of persistent contrast extravasation. IMPRESSION: Technically successful percutaneous coil embolization of the left renal artery for diffuse left-sided renal hemorrhage secondary to polycystic kidney disease. PLAN: - The patient was given a PCA pump for pain control. - Recommend continued observation and resuscitative management as deemed appropriate by the providing clinical team. Electronically Signed   By: Sandi Mariscal M.D.   On: 08/29/2018 15:11   Ir Embo Art  [employer] Hemorr Lymph [employer] Guide Roadmapping  Result Date: 08/29/2018 INDICATION: History of polycystic kidney disease with end-stage renal disease, currently on dialysis. Patient recently admitted with symptomatic hemorrhagic left-sided renal cysts initially attributable to supratherapeutic INR. Unfortunately, despite continued conservative measures, the now diffuse left-sided renal hemorrhage has enlarged, and as such, request made by Dr. Alinda Money (Urology) for percutaneous embolization. EXAM: 1. ULTRASOUND GUIDANCE FOR ARTERIAL ACCESS 2. SELECTIVE LEFT RENAL ARTERIOGRAM AND PERCUTANEOUS COIL EMBOLIZATION 3. SUB SELECTIVE SEGMENTAL ARTERIOGRAM AND PERCUTANEOUS COIL EMBOLIZATION  MEDICATIONS: None; the patient is admitted to the hospital receiving intravenous antibiotics. The antibiotic was administered within one hour of the procedure ANESTHESIA/SEDATION: Moderate (conscious) sedation was employed during this procedure. A total of Versed 2 mg and Fentanyl 100 mcg was administered intravenously. Moderate Sedation Time: 85 minutes. The patient's level of consciousness and vital signs were monitored continuously by radiology nursing throughout the procedure under my direct supervision. CONTRAST:  75 cc Isovue-300 FLUOROSCOPY TIME:  18 minutes, 42 seconds (1,856 mGy) COMPLICATIONS: None immediate. PROCEDURE: Informed consent was obtained from the patient following explanation of the procedure, risks, benefits and alternatives. The patient understands, agrees and consents for the procedure. All questions were addressed. A time out was performed prior to the initiation of the procedure. Maximal barrier sterile technique utilized including caps, mask, sterile gowns, sterile gloves, large sterile drape, hand hygiene, and Betadine prep. The right femoral head was marked fluoroscopically. Under sterile conditions and local anesthesia, the right common femoral artery access was performed with a micropuncture needle. Under direct ultrasound guidance, the right common femoral was accessed with a micropuncture kit. An ultrasound image was saved for documentation purposes. This allowed for placement of a 5-French vascular sheath. A limited arteriogram was performed through the side arm of the sheath confirming appropriate access within the right common femoral artery. A 5 [place] Mickelson catheter was advanced to the level of the abdominal aorta where it was formed, back bled and flushed. The catheter was utilized to select the left renal artery and a selective left renal arteriogram was performed. Next, with the use of a fathom 14 microwire, a regular Renegade microcatheter was utilized to select a  subsegmental branch vessel of the interpolar division of the left renal artery supplying an ill-defined area of contrast  extravasation. Contrast injection confirmed appropriate positioning and the subsegmental artery was coil embolized with 2 overlapping 2 mm and 3 mm interlock coils. Efforts were made to cannulate several additional segmental branches of the left renal artery supplying additional sites of contrast extravasation however ultimately this was deemed unnecessary due to the diffuse hemorrhage involving the entirety of left kidney. As such, the distal aspect of the interpolar division of the left renal artery was percutaneously coil embolized with multiple overlapping 4 mm, 5 mm and 6 mm interlock coils with extension of the coil pack into the origins of both the superior and inferior polar branches. Microcatheter was removed and completion left renal arteriogram was performed via the [facility] catheter. Images were reviewed and the procedure was terminated. At this point, all wires, catheters and sheaths were removed from the patient. Hemostasis was achieved at the right groin access site with manual compression. The patient tolerated the procedure well without immediate post procedural complication. FINDINGS: Selective left renal arteriogram demonstrates at least 3 ill-defined areas of active extravasation involving the superior, interpolar and inferior poles of the left kidney. Initially, a subsegmental branch of the interpolar segment of the left renal artery was selected and percutaneously coil embolized. Efforts were made to select the additional subsegmental arteries supplying the ill-defined areas of contrast extravasation within both the superior and inferior pole the left kidney, however ultimately this was deemed unnecessary given diffuse hemorrhage affecting the left kidney. As such, the entirety of the left renal artery was percutaneously coil embolized from the distal aspect of the  interpolar division of the left renal artery (with extension of the coil pack into the origin's of both the superior and inferior polar branches) to the level of the mid/distal left main renal artery. Following percutaneous embolization there is near complete occlusion of the left renal artery without definitive area of persistent contrast extravasation. IMPRESSION: Technically successful percutaneous coil embolization of the left renal artery for diffuse left-sided renal hemorrhage secondary to polycystic kidney disease. PLAN: - The patient was given a PCA pump for pain control. - Recommend continued observation and resuscitative management as deemed appropriate by the providing clinical team. Electronically Signed   By: Sandi Mariscal M.D.   On: 08/29/2018 15:11   Medications: . heparin 2,000 Units/hr (08/31/18 0235)   . sodium chloride   Intravenous Once  . acetaminophen  650 mg Oral Q6H  . amiodarone  200 mg Oral Daily  . atorvastatin  80 mg Oral q1800  . Chlorhexidine Gluconate Cloth  6 each Topical Q0600  . darbepoetin (ARANESP) injection - DIALYSIS  100 mcg Intravenous Q Tue-HD  . feeding supplement (NEPRO CARB STEADY)  237 mL Oral BID BM  . lanthanum  1,000 mg Oral TID PC  . midodrine  15 mg Oral TID WC  . multivitamin  1 tablet Oral QHS  . sodium chloride flush  10-40 mL Intracatheter Q12H  . warfarin  2 mg Oral ONCE-1800  . Warfarin - Pharmacist Dosing Inpatient   Does not apply q1800    Dialysis Orders: TTS, [facility], 4 hours 45 minutes, left forearm AVF(15G), BFR 450, DFR 800, 2K/2.5 calcium. HeparinNONE. EDW 133.5kg. Hectorol2.33mg TIW, Parsabiv 2.'5mg'$  TIW/ epo 6600  Assessment/Plan: 53year old male patient with end-stage renal disease due to polycystic kidney disease who presented with a left perinephric hematoma.   1. Recurrent left perinephric hematoma. Status post 5 units of PRBCs. Followed by IR and urology.  S/p embolization 5/13.  Hb stable now.  2.  End-stage renal disease on hemodialysis. Continue Tuesday, Thursday, Saturday dialysis schedule - HD yesterday 2L. Next tomorrow.  3. Chronic hypotension. Blood pressures remain on the low side. Continue midodrine.   4. AVR. coumadin had been on hold due to persistent bleeding.  Resumed yesterday with goal INR 2-2.5.  This AM 1.5.  5. Anemia. Secondary to end-stage renal disease and acute blood loss. He is now status post 5 units of packed RBCs. Continue Aranesp with dialysis. Hb stable since embolization.    6. Secondary hyperparathyroidism. Hectorol on hold with corr ca >10 . On Fosrenol for phosphorus of 5.5 5/14.  Cont to monitor in light of poor po intake.  .  7. Nutrition. Continue protein supplementation   Jannifer Hick MD 08/31/2018, 10:17 AM  [facility] Pager: [phone]

## 2018-08-31 NOTE — Progress Notes (Signed)
  Date: 08/31/2018  Patient name: [name] [name]  Medical record number: [id number]  Date of birth: [birth year]   I have seen and evaluated this patient and I have discussed the plan of care with the house staff. Please see their note for complete details. I concur with their findings with the following additions/corrections:  Continues to do well after embolization of the left kidney.  Pain is manageable, appetite has not improved much yet, but continuing to drink his Nepro shakes and will work on intake.  We will continue resumption of warfarin and bridging with heparin until INR is at goal of 2-2.5.  Hgb holding steady at 7.3, will continue to check daily.  Lenice Pressman, M.D., Ph.D. 08/31/2018, 2:02 PM

## 2018-08-31 NOTE — Progress Notes (Signed)
ANTICOAGULATION CONSULT NOTE  Pharmacy Consult for heparin Indication: Mechanical valve  No Known Allergies  Patient Measurements: Height: 6\' 1"  (185.4 cm) Weight: 288 lb 5.8 oz (130.8 kg) IBW/kg (Calculated) : 79.9  Vital Signs: Temp: 98 F (36.7 C) (05/15 1719) Temp Source: Oral (05/15 1719) BP: 106/74 (05/15 1719) Pulse Rate: 90 (05/15 1719)  Labs: Recent Labs    08/29/18 0400 08/29/18 0732 08/30/18 0501  08/31/18 0309 08/31/18 0946 08/31/18 2158  HGB 7.3*  --  7.2*  --  7.3*  --   --   HCT 23.1*  --  23.4*  --  24.5*  --   --   PLT 310  --  308  --  320  --   --   LABPROT 18.6* 17.3* 16.4*  --  17.4*  --   --   INR 1.6* 1.4* 1.3*  --  1.5*  --   --   HEPARINUNFRC  --   --   --    < > 0.34 0.62 0.34  CREATININE 5.66*  --  7.06*  --   --   --   --    < > = values in this interval not displayed.    Estimated Creatinine Clearance: 17.2 mL/min (A) (by C-G formula based on SCr of 7.06 mg/dL (H)).    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   Last dose warfarin was on 5/2 and patient is s/p 8 mg Vitamin K. INR goal has been decreased to 2-2.5  Patient now s/p embolization. Heparin level therapeutic at 0.34 units/ml  Goal of Therapy:  Heparin level 0.3-0.5 units/ml  INR 2-2.5 Monitor platelets by anticoagulation protocol: Yes   Plan:  Continue heparin at 2000 units/hr Daily INR, HL and CBC  Thanks for allowing pharmacy to be a part of this patient's care.  Excell Seltzer, PharmD Clinical Pharmacist    08/31/2018 10:50 PM

## 2018-08-31 NOTE — Progress Notes (Signed)
   Subjective: [name] [name] was feeling well this morning, apart from some mild abdominal discomfort. Pain post-embolization is well managed off of PCA pump. He continues to eat very little hospital food due to patient preference. He has been drinking protein shakes. He remains on bedrest with hopes of being able to get up in the next day or so.   Objective:  Vital signs in last 24 hours: Vitals:   08/30/18 1647 08/30/18 2227 08/31/18 0414 08/31/18 1028  BP: (!) 93/55 (!) 97/46 99/61 92/64   Pulse: (!) 106 (!) 110 (!) 101 96  Resp: 20 17 17 18   Temp: 97.8 F (36.6 C) 98.2 F (36.8 C) 98.8 F (37.1 C) 98.1 F (36.7 C)  TempSrc: Oral Oral Oral Oral  SpO2: 94% 94% 97% 94%  Weight:  130.8 kg    Height:       General: awake, alert, lying in bed in NAD CV: RRR; mechanical AVR click Abd: BS+; left side more swollen than right with mild tenderness  Ext: No edema   Assessment/Plan:  Principal Problem:   Perinephric hematoma Active Problems:   ESRD (end stage renal disease) (HCC)   Morbid obesity (HCC)   Systolic CHF (HCC)   S/P aortic valve replacement with bileaflet mechanical valve   Acute blood loss anemia   Gross hematuria   Supratherapeutic INR  [name] [name] is a 53 yo man with CAD s/p CABG, CHF, ESRD on HD, polycystic kidney disease, and aortic valve replacement on Coumadin who presented withabdominalpain and bleeding from the penissecondary to an expanding left-sided perinephric hematoma from PCKD in the setting of supratherapeutic INR.He continued to have bleeding and fluctuating INR despite holding warfarin. Underwent transcatheter embolization of the left kidney 5/13.   L perinephric hematoma:  AVR on Coumadin Recovering well post-op. Now off of PCA pump. Has required7 blood transfusions since admission. Hb stable at 7.3 this morning,  INR 1.5. Continue heparin bridge and Warfarin per pharmacy with INR goal of 2-2.5 - greatly appreciate IR and urology following -Trend  CBC - pain control with Dilaudid 1 mg q 4 PRN - PT/OT eval   ESRD on HD TTS -HD per nephrology - Continue home fosrenol - Continue ESA per nephro for iron deficiency anemia in the setting of acute blood loss and CKD  Hypotension:Chronic hypotension with MAPs 60-65.Asymptomatic.Blood pressurestable. - Continue home midodrine 15mg  TID -Discontinued homemetoprolol  Leukocytosis: slight uptrend today but overall stable. No obvious signs of infection. Likely related to stress reaction orresorption of known hematoma.  Malnutrition: Poor PO intake since admission. Seems more related to patient's to patient's distaste for hospital food, as he continues to endorse an appetite.  - appreciate nutrition following - Nepro shakes and Prostat supplements   Dispo: Anticipated discharge pending therapeutic INR while maintaining stable blood counts.    [name] Nunnery D, DO 08/31/2018, 12:34 PM Pager: [phone]

## 2018-08-31 NOTE — Progress Notes (Signed)
Patient ID: [name] [name], male   DOB: 04-25-1965, 53 y.o.   MRN: [id number]   IR Round Note via phone New regulations  Left renal hemorrhage embolization in IR 5/13-- Dr Ronny Bacon  IMPRESSION: Technically successful percutaneous coil embolization of the left renal artery for diffuse left-sided renal hemorrhage secondary to polycystic kidney disease.  INR 1.5 this am Hg stable 7.3 No transfusion overnight per chart  Rt groin site looks c/d/i per RN No hematoma  Pt feeling well Denies pain  Assessment/Plan: note per Dr [name] [name] today:  1) Left perinephric hematoma: Hgb remains stable and he is clinically improving with regard to pain symptoms.  Continue therapeutic anticoagulation.  Please call if evidence of further bleeding.  He has follow up with me in August.  Considering embolization, will likely image with MR without contrast as CT with contrast will no longer be helpful.  Doubt underlying malignancy as cause for bleeding but still important to rule out  Call IR if any further needs

## 2018-08-31 NOTE — Care Management Important Message (Signed)
Important Message  Patient Details  Name: [name] [name] MRN: [id number] Date of Birth: [birth year]   Medicare Important Message Given:  Yes    Memory Argue 08/31/2018, 2:06 PM

## 2018-08-31 NOTE — Progress Notes (Signed)
Nutrition Follow-up  DOCUMENTATION CODES:   Obesity unspecified  INTERVENTION:    Nepro Shake po BID, each supplement provides 425 kcal and 19 grams protein  30 ml Prostat TID, each supplement provides 100 kcals and 15 grams protein.   Renal MVI daily  NUTRITION DIAGNOSIS:   Moderate Malnutrition related to acute illness(diarrhea) as evidenced by energy intake < 75% for > 7 days, mild muscle depletion.  Ongoing  GOAL:   Patient will meet greater than or equal to 90% of their needs  Not meeting  MONITOR:   PO intake, Supplement acceptance, Weight trends, Labs, I & O's, Skin  REASON FOR ASSESSMENT:   Consult Diet education  ASSESSMENT:   Patient with PMH significant for ESRD on HD, polycystic kidney disease, CAD s/p CABG, CHF, and s/p AVR. Recently hospitalized 4/15-4/21 for left-sided perinephric hematoma in setting of PCKD and ruptured hemorrhagic cyst. Presents this admission with increased perinephric hematoma.    5/13- left sided renal embolization   Last HD treatment 5/14- 2000 ml net UF.   Spoke with pt at bedside. Reports appetite has been poor this admission due to ongoing watery stools preventing him from eating. Suspects this is related to being on antibiotics PTA as this has been an issue over the last month. Pt states he takes a couple bites of food and an hour later he has uncontrollable diarrhea. Discussed this issue with RN. She reports he had one episode this am, she is to monitor for more results today to rule out C.diff. If C.diff negative RN to speak with MD to see if we can start anti-diarrheal. Meal completions charted as 0% since admit. He endorses drinking Nepro BID, Prostat BID, and eating a few graham crackers daily. Discussed importance of PO intake with pt, briefly discussed feeding tube placement if poor intake continues Pt amendable to increase of supplementation.   Pt reports he makes a couple drops of urine daily.  EDW 133.5 kg. Current  weight 130.8 kg. Pt has been consistently lower than his EDW this admission. Suspect pt has lost dry weight, but pt is unable to confirm. Nutrition-Focused physical exam completed.   I/O: +4,033 ml since admit  Medications: aranesp, fosrenol, rena-vit, midodrine, amiodarone Labs: Na 134 (L) Phosphorus 5.5 CBG 62-104  Diet Order:   Diet Order            Diet renal with fluid restriction Fluid restriction: 1200 mL Fluid; Room service appropriate? Yes; Fluid consistency: Thin  Diet effective now              EDUCATION NEEDS:   Education needs have been addressed  Skin:  Skin Assessment: Skin Integrity Issues: Skin Integrity Issues:: Other (Comment), Incisions Incisions: right groin Other: MASD- mid chest, left thigh AND open wound mid chest  Last BM:  5/15- liquid  Height:   Ht Readings from Last 1 Encounters:  08/21/18 6\' 1"  (1.854 m)    Weight:   Wt Readings from Last 1 Encounters:  08/30/18 130.8 kg    Ideal Body Weight:  83.6 kg  BMI:  Body mass index is 38.04 kg/m.  Estimated Nutritional Needs:   Kcal:  2400-2600 kcal   Protein:  120-135 grams  Fluid:  1000 ml + UOP  Mariana Single RD, LDN Clinical Nutrition Pager # [phone]

## 2018-08-31 NOTE — Progress Notes (Signed)
Patient ID: [name] [name], male   DOB: [birth year], 53 y.o.   MRN: [id number]    Subjective: Pt continues to do well s/p embolization. His pain is very mild now.  No fever.  Objective: Vital signs in last 24 hours: Temp:  [97.8 F (36.6 C)-98.8 F (37.1 C)] 98.8 F (37.1 C) (05/15 0414) Pulse Rate:  [94-110] 101 (05/15 0414) Resp:  [16-23] 17 (05/15 0414) BP: (87-124)/(46-94) 99/61 (05/15 0414) SpO2:  [94 %-100 %] 97 % (05/15 0414) Weight:  [130.8 kg-132.2 kg] 130.8 kg (05/14 2227)  Intake/Output from previous day: 05/14 0701 - 05/15 0700 In: 380 [P.O.:220; I.V.:160] Out: 2000  Intake/Output this shift: No intake/output data recorded.  Physical Exam:  General: Alert and oriented Abd: Still tender in left abdomen and left CVA as expected  Lab Results: Recent Labs    08/29/18 0400 08/30/18 0501 08/31/18 0309  HGB 7.3* 7.2* 7.3*  HCT 23.1* 23.4* 24.5*   BMET Recent Labs    08/29/18 0400 08/30/18 0501  NA 135 134*  K 3.6 4.0  CL 96* 95*  CO2 28 26  GLUCOSE 86 62*  BUN 37* 51*  CREATININE 5.66* 7.06*  CALCIUM 8.3* 8.1*     Studies/Results:   Assessment/Plan: 1) Left perinephric hematoma: Hgb remains stable and he is clinically improving with regard to pain symptoms.  Continue therapeutic anticoagulation.  Please call if evidence of further bleeding.  He has follow up with me in August.  Considering embolization, will likely image with MR without contrast as CT with contrast will no longer be helpful.  Doubt underlying malignancy as cause for bleeding but still important to rule out. Please call if further questions.     LOS: 12 days   Dutch Gray 08/31/2018, 7:31 AM

## 2018-09-01 LAB — CBC
HCT: 23.5 % — ABNORMAL LOW (ref 39.0–52.0)
HCT: 24 % — ABNORMAL LOW (ref 39.0–52.0)
Hemoglobin: 7.2 g/dL — ABNORMAL LOW (ref 13.0–17.0)
Hemoglobin: 7.3 g/dL — ABNORMAL LOW (ref 13.0–17.0)
MCH: 26.8 pg (ref 26.0–34.0)
MCH: 26.4 pg (ref 26.0–34.0)
MCHC: 30.6 g/dL (ref 30.0–36.0)
MCHC: 30.4 g/dL (ref 30.0–36.0)
MCV: 87.4 fL (ref 80.0–100.0)
MCV: 86.6 fL (ref 80.0–100.0)
Platelets: 342 10*3/uL (ref 150–400)
Platelets: 329 10*3/uL (ref 150–400)
RBC: 2.69 MIL/uL — ABNORMAL LOW (ref 4.22–5.81)
RBC: 2.77 MIL/uL — ABNORMAL LOW (ref 4.22–5.81)
RDW: 18.1 % — ABNORMAL HIGH (ref 11.5–15.5)
RDW: 18 % — ABNORMAL HIGH (ref 11.5–15.5)
WBC: 14.2 10*3/uL — ABNORMAL HIGH (ref 4.0–10.5)
WBC: 13.2 10*3/uL — ABNORMAL HIGH (ref 4.0–10.5)
nRBC: 0 % (ref 0.0–0.2)
nRBC: 0 % (ref 0.0–0.2)

## 2018-09-01 LAB — RENAL FUNCTION PANEL
Albumin: 1.4 g/dL — ABNORMAL LOW (ref 3.5–5.0)
Anion gap: 16 — ABNORMAL HIGH (ref 5–15)
BUN: 40 mg/dL — ABNORMAL HIGH (ref 6–20)
CO2: 25 mmol/L (ref 22–32)
Calcium: 8.2 mg/dL — ABNORMAL LOW (ref 8.9–10.3)
Chloride: 93 mmol/L — ABNORMAL LOW (ref 98–111)
Creatinine, Ser: 6.22 mg/dL — ABNORMAL HIGH (ref 0.61–1.24)
GFR calc Af Amer: 11 mL/min — ABNORMAL LOW (ref >60)
GFR calc non Af Amer: 9 mL/min — ABNORMAL LOW (ref >60)
Glucose, Bld: 68 mg/dL — ABNORMAL LOW (ref 70–99)
Phosphorus: 4.4 mg/dL (ref 2.5–4.6)
Potassium: 3.8 mmol/L (ref 3.5–5.1)
Sodium: 134 mmol/L — ABNORMAL LOW (ref 135–145)

## 2018-09-01 LAB — PROTIME-INR
INR: 1.8 — ABNORMAL HIGH (ref 0.8–1.2)
Prothrombin Time: 20.3 seconds — ABNORMAL HIGH (ref 11.4–15.2)

## 2018-09-01 LAB — HEPARIN LEVEL (UNFRACTIONATED)
Heparin Unfractionated: 0.26 IU/mL — ABNORMAL LOW (ref 0.30–0.70)
Heparin Unfractionated: 0.31 IU/mL (ref 0.30–0.70)

## 2018-09-01 MED ORDER — MIDODRINE HCL 5 MG PO TABS
ORAL_TABLET | ORAL | Status: AC
  Filled 2018-09-01: qty 3

## 2018-09-01 MED ORDER — HEPARIN SODIUM (PORCINE) 1000 UNIT/ML DIALYSIS: 1000.0000 [IU] | INTRAMUSCULAR | Status: DC | PRN

## 2018-09-01 MED ORDER — SODIUM CHLORIDE 0.9 % IV SOLN: 100.0000 mL | INTRAVENOUS | Status: DC | PRN

## 2018-09-01 MED ORDER — LIDOCAINE HCL (PF) 1 % IJ SOLN: 5.0000 mL | INTRAMUSCULAR | Status: DC | PRN

## 2018-09-01 MED ORDER — WARFARIN SODIUM 2 MG PO TABS
2.0000 mg | ORAL_TABLET | Freq: Once | ORAL | Status: AC
  Administered 2018-09-01: 2 mg via ORAL
  Filled 2018-09-01: qty 1

## 2018-09-01 MED ORDER — LIDOCAINE-PRILOCAINE 2.5-2.5 % EX CREA: 1.0000 "application " | TOPICAL_CREAM | CUTANEOUS | Status: DC | PRN

## 2018-09-01 MED ORDER — PENTAFLUOROPROP-TETRAFLUOROETH EX AERO: 1.0000 "application " | INHALATION_SPRAY | CUTANEOUS | Status: DC | PRN

## 2018-09-01 MED ORDER — ALTEPLASE 2 MG IJ SOLR: 2.0000 mg | Freq: Once | INTRAMUSCULAR | Status: DC | PRN

## 2018-09-01 NOTE — Progress Notes (Signed)
[facility] Progress Note   Subjective:   Doing well s/p IR embolization 5/13 AM with multiple areas active bleeding noted.  Hb remains stable. INR this AM 1.8. HD today.  Found out older passed away suddenly earlier in the week, processing.  Objective Vitals:   08/31/18 0414 08/31/18 1028 08/31/18 1719 09/01/18 0441  BP: 99/61 92/64 106/74 101/71  Pulse: (!) 101 96 90 93  Resp: 17 18 18 18   Temp: 98.8 F (37.1 C) 98.1 F (36.7 C) 98 F (36.7 C) 98.4 F (36.9 C)  TempSrc: Oral Oral Oral Oral  SpO2: 97% 94% 97% 91%  Weight:      Height:       Physical Exam General: comfortably lying flat in bed Heart: RRR Lungs: clear to bases, normal WOB Abdomen: minimally TTP throughout Extremities: no edema Dialysis Access:  L FA AVF with thrill and bruit   Additional Objective Labs: Basic Metabolic Panel: Recent Labs  Lab 08/29/18 0400 08/30/18 0501 09/01/18 0737  NA 135 134* 134*  K 3.6 4.0 3.8  CL 96* 95* 93*  CO2 28 26 25   GLUCOSE 86 62* 68*  BUN 37* 51* 40*  CREATININE 5.66* 7.06* 6.22*  CALCIUM 8.3* 8.1* 8.2*  PHOS 4.4 5.5* 4.4   Liver Function Tests: Recent Labs  Lab 08/29/18 0400 08/30/18 0501 09/01/18 0737  ALBUMIN 1.4* 1.4* 1.4*   No results for input(s): LIPASE, AMYLASE in the last 168 hours. CBC: Recent Labs  Lab 08/29/18 0400 08/30/18 0501 08/31/18 0309 09/01/18 0359 09/01/18 0737  WBC 14.8* 14.0* 17.4* 14.2* 13.2*  HGB 7.3* 7.2* 7.3* 7.2* 7.3*  HCT 23.1* 23.4* 24.5* 23.5* 24.0*  MCV 86.2 88.6 89.1 87.4 86.6  PLT 310 308 320 342 329   Blood Culture    Component Value Date/Time   SDES AORTIC VALVE TISSUE 09/01/2017 1220   SPECREQUEST NONE 09/01/2017 1220   CULT  09/01/2017 1220    No growth aerobically or anaerobically. Performed at [facility], [place] [address] [place],  [address]    REPTSTATUS 09/06/2017 FINAL 09/01/2017 1220    Cardiac Enzymes: No results for input(s): CKTOTAL, CKMB, CKMBINDEX, TROPONINI  in the last 168 hours. CBG: No results for input(s): GLUCAP in the last 168 hours. Iron Studies: No results for input(s): IRON, TIBC, TRANSFERRIN, FERRITIN in the last 72 hours. @lablastinr3 @ Studies/Results: No results found. Medications: . sodium chloride    . sodium chloride    . heparin 1,950 Units/hr (09/01/18 7902)   . sodium chloride   Intravenous Once  . acetaminophen  650 mg Oral Q6H  . amiodarone  200 mg Oral Daily  . atorvastatin  80 mg Oral q1800  . Chlorhexidine Gluconate Cloth  6 each Topical Q0600  . Chlorhexidine Gluconate Cloth  6 each Topical Q0600  . darbepoetin (ARANESP) injection - DIALYSIS  100 mcg Intravenous Q Tue-HD  . feeding supplement (NEPRO CARB STEADY)  237 mL Oral BID BM  . feeding supplement (PRO-STAT SUGAR FREE 64)  30 mL Oral TID  . lanthanum  1,000 mg Oral TID PC  . midodrine  15 mg Oral TID WC  . multivitamin  1 tablet Oral QHS  . sodium chloride flush  10-40 mL Intracatheter Q12H  . warfarin  2 mg Oral ONCE-1800  . Warfarin - Pharmacist Dosing Inpatient   Does not apply q1800    Dialysis Orders: TTS, [facility], 4 hours 45 minutes, left forearm AVF(15G), BFR 450, DFR 800, 2K/2.5 calcium. HeparinNONE. EDW  133.5kg. Hectorol2.65mcg TIW, Parsabiv 2.5mg  TIW/ epo 6600  Assessment/Plan: 53 year old male patient with end-stage renal disease due to polycystic kidney disease who presented with a left perinephric hematoma.   1. Recurrent left perinephric hematoma. Status post 5 units of PRBCs. Followed by IR and urology.  S/p embolization 5/13.  Hb stable now.  2. End-stage renal disease on hemodialysis. Continue Tuesday, Thursday, Saturday dialysis schedule. HD today.   3. Chronic hypotension. Blood pressures remain on the low side. Continue midodrine.   4. AVR. coumadin had been on hold due to persistent bleeding.  Resumed yesterday with goal INR 2-2.5.  This AM 1.8.  5. Anemia. Secondary to end-stage renal disease and  acute blood loss. He is now status post 5 units of packed RBCs. Continue Aranesp with dialysis. Hb stable since embolization.    6. Secondary hyperparathyroidism. Hectorol on hold with corr ca >10 . On Fosrenol for phosphorus of 5.5 5/14.  Cont to monitor in light of poor po intake.  .  7. Nutrition. Continue protein supplementation   Jannifer Hick MD 09/01/2018, 8:26 AM  [facility] Pager: [phone]

## 2018-09-01 NOTE — Progress Notes (Signed)
[place] for heparin/warfarin Indication: Mechanical valve  No Known Allergies  Patient Measurements: Height: 6\' 1"  (185.4 cm) Weight: 236 lb 1.8 oz (107.1 kg) IBW/kg (Calculated) : 79.9  Vital Signs: Temp: 98.3 F (36.8 C) (05/16 1645) Temp Source: Oral (05/16 1645) BP: 101/65 (05/16 1645) Pulse Rate: 93 (05/16 1645)  Labs: Recent Labs    08/30/18 0501  08/31/18 0309  08/31/18 2158 09/01/18 0359 09/01/18 0737 09/01/18 1605  HGB 7.2*  --  7.3*  --   --  7.2* 7.3*  --   HCT 23.4*  --  24.5*  --   --  23.5* 24.0*  --   PLT 308  --  320  --   --  342 329  --   LABPROT 16.4*  --  17.4*  --   --  20.3*  --   --   INR 1.3*  --  1.5*  --   --  1.8*  --   --   HEPARINUNFRC  --    < > 0.34   < > 0.34 0.26*  --  0.31  CREATININE 7.06*  --   --   --   --   --  6.22*  --    < > = values in this interval not displayed.    Estimated Creatinine Clearance: 17.6 mL/min (A) (by C-G formula based on SCr of 6.22 mg/dL (H)).  Assessment: 5 yoM on warfarin PTA for MAVR. INR supratherapeutic on admit, warfarin held (last dose PTA 5/2) and now s/p 8mg  vitamin K. INR became subtherapeutic and pharmacy consulted for IV heparin dosing - no bolus and lower heparin level goals 2/2 bleed risk with current hematuria. INR goal has also been decreased to 2-2.5  Heparin level just with goal range at 0.31 following rate increase this AM. Hgb 7.3 stable, pltc WNL. No bleeding noted.    Goal of Therapy:  Heparin level 0.3-0.5 units/ml  INR 2-2.5 Monitor platelets by anticoagulation protocol: Yes   Plan:  Continue heparin gtt at 2000 units/hr Recheck heparin level with AM labs Give warfarin 2mg  x1 tonight Daily INR, heparin level, and CBC Monitor for s/sx of bleeding  Stana Bayon N. Gerarda Fraction, PharmD, [place] PGY2 Infectious Diseases Pharmacy Resident Phone: [phone] 09/01/2018 4:54 PM

## 2018-09-01 NOTE — Progress Notes (Signed)
Internal Medicine Teaching Service Attending:   I saw and examined the patient. I reviewed the resident's note and I agree with the resident's findings and plan as documented in the resident's note.  Principal Problem:   Perinephric hematoma Active Problems:   ESRD (end stage renal disease) (HCC)   Morbid obesity (HCC)   Systolic CHF (HCC)   S/P aortic valve replacement with bileaflet mechanical valve   Acute blood loss anemia   Gross hematuria   Supratherapeutic INR  Hospital day #12 for this 53 year old person living with end-stage renal disease on hemodialysis admitted with a large spontaneous left perinephric hematoma likely related to underlying PCKD.  He has been stable since IR embolization procedure, hematuria is slowing down which seems like a positive sign.  Hemoglobin level stable around 7.3 g and receiving EPO product through dialysis.  Patient is back on anticoagulation for the mechanical aortic valve and stroke prophylaxis, INR 1.8 today, will want to see at least 2 days of therapeutic INR before discontinuing heparin infusion.  Lalla Brothers, MD FACP

## 2018-09-01 NOTE — Progress Notes (Signed)
[place] for heparin/warfarin Indication: Mechanical valve  No Known Allergies  Patient Measurements: Height: 6\' 1"  (185.4 cm) Weight: 288 lb 5.8 oz (130.8 kg) IBW/kg (Calculated) : 79.9  Vital Signs: Temp: 98.4 F (36.9 C) (05/16 0441) Temp Source: Oral (05/16 0441) BP: 101/71 (05/16 0441) Pulse Rate: 93 (05/16 0441)  Labs: Recent Labs    08/30/18 0501  08/31/18 0309 08/31/18 0946 08/31/18 2158 09/01/18 0359  HGB 7.2*  --  7.3*  --   --  7.2*  HCT 23.4*  --  24.5*  --   --  23.5*  PLT 308  --  320  --   --  342  LABPROT 16.4*  --  17.4*  --   --  20.3*  INR 1.3*  --  1.5*  --   --  1.8*  HEPARINUNFRC  --    < > 0.34 0.62 0.34 0.26*  CREATININE 7.06*  --   --   --   --   --    < > = values in this interval not displayed.    Estimated Creatinine Clearance: 17.2 mL/min (A) (by C-G formula based on SCr of 7.06 mg/dL (H)).  Assessment: 7 YOM on warfarin PTA for mech aortic valve with subtherapeutic INR started on heparin with no bolus and tight goals d/t bleed risk with current hematuria. Patient now s/p embolization on 5/13. Last dose warfarin PTA was on 5/2 and patient is s/p 8 mg Vitamin K. INR goal has been decreased to 2-2.5  Heparin level subtherapeutic this AM at 0.26 units/ml. Patient's INR is trending up and is 1.8. Hgb 7.2, HCT 23.5, and pltc 342. No bleeding or bruising noted per RN.    Goal of Therapy:  Heparin level 0.3-0.5 units/ml  INR 2-2.5 Monitor platelets by anticoagulation protocol: Yes   Plan:  Increase heparin to 2000 units/hr Recheck heparin level in 8 hours Give warfarin 2mg  x 1 tonight Daily INR, HL and CBC  Thank you for allowing pharmacy to be a part of this patient's care.  Leron Croak, PharmD PGY1 Pharmacy Resident Phone: [phone]  Please check AMION for all [facility] phone numbers 09/01/2018 7:10 AM

## 2018-09-01 NOTE — Progress Notes (Signed)
   Subjective: [name] [name] was seen this morning while receiving dialysis. He was glad to get out of bed and work with PT yesterday which went well. Still endorsing some abdominal pain which is worse with movement. Still having loose stools. Denies nausea or vomiting. Still having some hematuria, but it has significantly improved since admission.   Objective:  Vital signs in last 24 hours: Vitals:   08/31/18 0414 08/31/18 1028 08/31/18 1719 09/01/18 0441  BP: 99/61 92/64 106/74 101/71  Pulse: (!) 101 96 90 93  Resp: 17 18 18 18   Temp: 98.8 F (37.1 C) 98.1 F (36.7 C) 98 F (36.7 C) 98.4 F (36.9 C)  TempSrc: Oral Oral Oral Oral  SpO2: 97% 94% 97% 91%  Weight:      Height:       General: awake, alert, lying in bed in NAD CV: RRR Abd: abdomen is soft, mildly tender throughout   Assessment/Plan:  Principal Problem:   Perinephric hematoma Active Problems:   ESRD (end stage renal disease) (HCC)   Morbid obesity (HCC)   Systolic CHF (HCC)   S/P aortic valve replacement with bileaflet mechanical valve   Acute blood loss anemia   Gross hematuria   Supratherapeutic INR  [name] [name] is a 53 yo man with CAD s/p CABG, CHF, ESRD on HD, polycystic kidney disease, and aortic valve replacement on Coumadin who presented withabdominalpain and bleeding from the penissecondary to an expanding left-sided perinephric hematoma from PCKDin the setting ofsupratherapeutic INR.He continued to have bleeding and fluctuating INR despite holding warfarin and required total of 7 units of pRBCs. Underwent transcatheter embolization of the left kidney 5/13. Since the procedure, his hgb has remained stable. He was resumed back on Warfarin with heparin bridge until INR becomes therapeutic.   L perinephric hematoma:  AVR on Coumadin - Recovering well post-op. Only using q 4 dilaudid 1 mg 1-2x per day. Can likely transition to PO tomorrow  - Hb stable at 7.3this morning,  INR 1.8.Continue heparin  bridge and Warfarin per pharmacy with INR goal of 2-2.5 - greatly appreciate IR and urology following -Trend CBC - continue PT/OT efforts   ESRD on HD TTS -appreciate nephrology managing inpatient HD  - Continue home fosrenol  Hypotension:Chronic hypotension with MAPs 60-65.Asymptomatic.Blood pressurestable. - Continue home midodrine 15mg  TID -Discontinued homemetoprolol  Malnutrition: Poor PO intake since admission. Seems more related to patient's distaste for hospital food, as he continues to endorse an appetite.  - appreciate nutrition following - Nepro shakes and Prostat supplements   Dispo: Anticipated discharge pending therapeutic INR while maintaining stable blood counts.    [name], DO 09/01/2018, 6:42 AM Pager: [phone]

## 2018-09-02 LAB — CBC
HCT: 23 % — ABNORMAL LOW (ref 39.0–52.0)
Hemoglobin: 7 g/dL — ABNORMAL LOW (ref 13.0–17.0)
MCH: 26.6 pg (ref 26.0–34.0)
MCHC: 30.4 g/dL (ref 30.0–36.0)
MCV: 87.5 fL (ref 80.0–100.0)
Platelets: 319 10*3/uL (ref 150–400)
RBC: 2.63 MIL/uL — ABNORMAL LOW (ref 4.22–5.81)
RDW: 17.9 % — ABNORMAL HIGH (ref 11.5–15.5)
WBC: 10.3 10*3/uL (ref 4.0–10.5)
nRBC: 0 % (ref 0.0–0.2)

## 2018-09-02 LAB — IRON AND TIBC
Iron: 16 ug/dL — ABNORMAL LOW (ref 45–182)
Saturation Ratios: 14 % — ABNORMAL LOW (ref 17.9–39.5)
TIBC: 111 ug/dL — ABNORMAL LOW (ref 250–450)
UIBC: 95 ug/dL

## 2018-09-02 LAB — PROTIME-INR
INR: 2 — ABNORMAL HIGH (ref 0.8–1.2)
Prothrombin Time: 22.4 seconds — ABNORMAL HIGH (ref 11.4–15.2)

## 2018-09-02 LAB — HEPARIN LEVEL (UNFRACTIONATED): Heparin Unfractionated: 0.34 IU/mL (ref 0.30–0.70)

## 2018-09-02 MED ORDER — WARFARIN SODIUM 2 MG PO TABS
2.0000 mg | ORAL_TABLET | Freq: Once | ORAL | Status: AC
  Administered 2018-09-02: 2 mg via ORAL
  Filled 2018-09-02: qty 1

## 2018-09-02 NOTE — Progress Notes (Signed)
This RN talked to the pt. to try to ambulate in the hall in 30 minutes. Came back to pt.s room to start ambulating, pt. Complained of abdominal pain and refuse to ambulate.

## 2018-09-02 NOTE — Plan of Care (Signed)
  Problem: Elimination: Goal: Will not experience complications related to bowel motility Outcome: Not Progressing   Problem: Pain Managment: Goal: General experience of comfort will improve Outcome: Progressing

## 2018-09-02 NOTE — Progress Notes (Signed)
[place] for warfarin Indication: Mechanical valve  No Known Allergies  Patient Measurements: Height: 6\' 1"  (185.4 cm) Weight: 235 lb 14.3 oz (107 kg) IBW/kg (Calculated) : 79.9  Vital Signs: Temp: 97.4 F (36.3 C) (05/17 0436) Temp Source: Oral (05/17 0436) BP: 101/71 (05/17 0436) Pulse Rate: 88 (05/17 0436)  Labs: Recent Labs    08/31/18 0309  09/01/18 0359 09/01/18 0737 09/01/18 1605 09/02/18 0323  HGB 7.3*  --  7.2* 7.3*  --  7.0*  HCT 24.5*  --  23.5* 24.0*  --  23.0*  PLT 320  --  342 329  --  319  LABPROT 17.4*  --  20.3*  --   --  22.4*  INR 1.5*  --  1.8*  --   --  2.0*  HEPARINUNFRC 0.34   < > 0.26*  --  0.31 0.34  CREATININE  --   --   --  6.22*  --   --    < > = values in this interval not displayed.    Estimated Creatinine Clearance: 17.6 mL/min (A) (by C-G formula based on SCr of 6.22 mg/dL (H)).  Assessment: 53 YOM on warfarin PTA for mech aortic valve with subtherapeutic INR started on heparin with no bolus and tight goals d/t bleed risk with current hematuria. Patient now s/p embolization on 5/13. Last dose warfarin PTA was on 5/2 and patient is s/p 8 mg Vitamin K. INR goal has been decreased to 2-2.5  Heparin gtt discontinued this AM. Patient's INR is therapeutic for the first time today at 2.0. Hgb down from 7.2 > 7.0, HCT 23, and pltc 313. No bleeding noted.  Goal of Therapy:  INR 2-2.5 Monitor platelets by anticoagulation protocol: Yes   Plan:  Give warfarin 2mg  x 1 tonight Stop heparin gtt per MD Daily INR and CBC  Thank you for allowing pharmacy to be a part of this patient's care.  Leron Croak, PharmD PGY1 Pharmacy Resident Phone: [phone]  Please check AMION for all [place] phone numbers 09/02/2018 8:16 AM

## 2018-09-02 NOTE — Progress Notes (Signed)
   Subjective: Pt reports some recurrence of blood oozing from his penis last night.  His pain has improved.  Appetite still poor.    Objective:  Vital signs in last 24 hours: Vitals:   09/01/18 1230 09/01/18 1645 09/01/18 2058 09/02/18 0436  BP: 97/66 101/65 94/60 101/71  Pulse: 95 93 90 88  Resp: 20 18 18 17   Temp: 97.8 F (36.6 C) 98.3 F (36.8 C) 98.6 F (37 C) (!) 97.4 F (36.3 C)  TempSrc: Oral Oral Oral Oral  SpO2: 97% 94% 93% 95%  Weight:   107 kg   Height:       Cardiac: normal rate and rhythm, clear s1 and s2, mechanical valve click Pulmonary: CTAB, not in distress Abdominal: non distended abdomen, soft and nontender GU: minimal amount of dry blood on washcloth covering penis, no active bleeding Psych: flat affect   Assessment/Plan:  Principal Problem:   Perinephric hematoma Active Problems:   ESRD (end stage renal disease) (HCC)   Morbid obesity (HCC)   Systolic CHF (HCC)   S/P aortic valve replacement with bileaflet mechanical valve   Acute blood loss anemia   Gross hematuria   Supratherapeutic INR  [name] is a 53 yo man with CAD s/p CABG, CHF, ESRD on HD, polycystic kidney disease, and aortic valve replacement on Coumadin who presented withabdominalpain and bleeding from the penissecondary to an expanding left-sided perinephric hematoma from PCKDin the setting ofsupratherapeutic INR.He continued to have bleeding and fluctuating INR despite holding warfarin and required total of 7 units of pRBCs. Underwent transcatheter embolization of the left kidney 5/13. Since the procedure, his hgb has remained stable. He was resumed back on Warfarin with heparin bridge until INR becomes therapeutic.   L perinephric hematoma: complicated by mechanical valve.  S/p embolization of left renal artery.    -INR therapeutic today and hgb trending down some bleeding last night d/c heparin -pain control -repeat iron studies to see if he would benefit from IV iron   ESRD on HD TTS: -appreciate nephrology managing inpatient HD  - Continue home fosrenol  Hypotension:Chronic hypotension with MAPs 60-65.Asymptomatic.Blood pressurestable. - Continue home midodrine 15mg  TID -Discontinued homemetoprolol  Malnutrition: Poor PO intake since admission. Seems more related to patient's distaste for hospital food, as he continues to endorse an appetite.  - appreciate nutrition following - Nepro shakes and Prostat supplements   Dispo: Anticipated discharge pending therapeutic INR while maintaining stable blood counts.    [name], MD 09/02/2018, 8:53 AM

## 2018-09-02 NOTE — Progress Notes (Signed)
[facility] Progress Note   Subjective:  INR this AM 2, Hb down from 7.2-7.3 to 7, heparin gtt discontinued. Plan to monitor an additional day to make sure Hb stable. No new complaints.  Objective Vitals:   09/01/18 1645 09/01/18 2058 09/02/18 0436 09/02/18 0922  BP: 101/65 94/60 101/71 106/67  Pulse: 93 90 88 85  Resp: 18 18 17 18   Temp: 98.3 F (36.8 C) 98.6 F (37 C) (!) 97.4 F (36.3 C) 97.7 F (36.5 C)  TempSrc: Oral Oral Oral Oral  SpO2: 94% 93% 95% 97%  Weight:  107 kg    Height:       Physical Exam General: comfortably lying flat in bed  Heart: RRR Lungs: clear to bases, normal WOB Abdomen: minimally TTP throughout Extremities: no edema Dialysis Access:  L FA AVF with thrill and bruit   Additional Objective Labs: Basic Metabolic Panel: Recent Labs  Lab 08/29/18 0400 08/30/18 0501 09/01/18 0737  NA 135 134* 134*  K 3.6 4.0 3.8  CL 96* 95* 93*  CO2 28 26 25   GLUCOSE 86 62* 68*  BUN 37* 51* 40*  CREATININE 5.66* 7.06* 6.22*  CALCIUM 8.3* 8.1* 8.2*  PHOS 4.4 5.5* 4.4   Liver Function Tests: Recent Labs  Lab 08/29/18 0400 08/30/18 0501 09/01/18 0737  ALBUMIN 1.4* 1.4* 1.4*   No results for input(s): LIPASE, AMYLASE in the last 168 hours. CBC: Recent Labs  Lab 08/30/18 0501 08/31/18 0309 09/01/18 0359 09/01/18 0737 09/02/18 0323  WBC 14.0* 17.4* 14.2* 13.2* 10.3  HGB 7.2* 7.3* 7.2* 7.3* 7.0*  HCT 23.4* 24.5* 23.5* 24.0* 23.0*  MCV 88.6 89.1 87.4 86.6 87.5  PLT 308 320 342 329 319   Blood Culture    Component Value Date/Time   SDES AORTIC VALVE TISSUE 09/01/2017 1220   SPECREQUEST NONE 09/01/2017 1220   CULT  09/01/2017 1220    No growth aerobically or anaerobically. Performed at [facility], [place] [address] [place], [place] [address]    REPTSTATUS 09/06/2017 FINAL 09/01/2017 1220    Cardiac Enzymes: No results for input(s): CKTOTAL, CKMB, CKMBINDEX, TROPONINI in the last 168 hours. CBG: No results for  input(s): GLUCAP in the last 168 hours. Iron Studies: No results for input(s): IRON, TIBC, TRANSFERRIN, FERRITIN in the last 72 hours. @lablastinr3 @ Studies/Results: No results found. Medications:  . sodium chloride   Intravenous Once  . acetaminophen  650 mg Oral Q6H  . amiodarone  200 mg Oral Daily  . atorvastatin  80 mg Oral q1800  . Chlorhexidine Gluconate Cloth  6 each Topical Q0600  . Chlorhexidine Gluconate Cloth  6 each Topical Q0600  . darbepoetin (ARANESP) injection - DIALYSIS  100 mcg Intravenous Q Tue-HD  . feeding supplement (NEPRO CARB STEADY)  237 mL Oral BID BM  . feeding supplement (PRO-STAT SUGAR FREE 64)  30 mL Oral TID  . lanthanum  1,000 mg Oral TID PC  . midodrine  15 mg Oral TID WC  . multivitamin  1 tablet Oral QHS  . sodium chloride flush  10-40 mL Intracatheter Q12H  . warfarin  2 mg Oral ONCE-1800  . Warfarin - Pharmacist Dosing Inpatient   Does not apply q1800    Dialysis Orders: TTS, [facility], 4 hours 45 minutes, left forearm AVF(15G), BFR 450, DFR 800, 2K/2.5 calcium. HeparinNONE. EDW 133.5kg. Hectorol2.92mcg TIW, [place] 2.5mg  TIW/ epo 6600  Assessment/Plan: 53 year old male patient with end-stage renal disease due to polycystic kidney disease who presented with a  left perinephric hematoma.   1. Recurrent left perinephric hematoma. Status post 5 units of PRBCs. Followed by IR and urology.  S/p embolization 5/13.  Hb has been stable, perhaps a bit lower this AM.  2. End-stage renal disease on hemodialysis. Continue Tuesday, Thursday, Saturday dialysis schedule. HD Tues next. Will need new EDW on D/C.  3. Chronic hypotension. Blood pressures remain on the low side. Continue midodrine.   4. AVR. coumadin had been on hold due to persistent bleeding prior to embolization.  Resumed and INR this AM 2; heparin gtt off due to modest decline in Hb.     5. Anemia. Secondary to end-stage renal disease and acute blood loss. He is  now status post 5 units of packed RBCs. Continue Aranesp with dialysis. Hb ~stable since embolization.    6. Secondary hyperparathyroidism. Hectorol on hold with corr ca >10 . On Fosrenol for phosphorus of 5.5 5/14.  Cont to monitor in light of poor po intake.  .  7. Nutrition. Continue protein supplementation.    Jannifer Hick MD 09/02/2018, 9:58 AM  [facility] Pager: [phone]

## 2018-09-03 LAB — PROTIME-INR
INR: 2.5 — ABNORMAL HIGH (ref 0.8–1.2)
Prothrombin Time: 26.8 seconds — ABNORMAL HIGH (ref 11.4–15.2)

## 2018-09-03 LAB — CBC
HCT: 23.6 % — ABNORMAL LOW (ref 39.0–52.0)
Hemoglobin: 7.1 g/dL — ABNORMAL LOW (ref 13.0–17.0)
MCH: 26.4 pg (ref 26.0–34.0)
MCHC: 30.1 g/dL (ref 30.0–36.0)
MCV: 87.7 fL (ref 80.0–100.0)
Platelets: 337 10*3/uL (ref 150–400)
RBC: 2.69 MIL/uL — ABNORMAL LOW (ref 4.22–5.81)
RDW: 17.7 % — ABNORMAL HIGH (ref 11.5–15.5)
WBC: 9.7 10*3/uL (ref 4.0–10.5)
nRBC: 0.2 % (ref 0.0–0.2)

## 2018-09-03 MED ORDER — HYDROMORPHONE HCL 2 MG PO TABS: 1.0000 mg | ORAL_TABLET | Freq: Four times a day (QID) | ORAL | 0 refills | Status: AC | PRN

## 2018-09-03 MED ORDER — SODIUM CHLORIDE 0.9 % IV SOLN
510.0000 mg | Freq: Once | INTRAVENOUS | Status: AC
  Administered 2018-09-03: 510 mg via INTRAVENOUS
  Filled 2018-09-03: qty 17

## 2018-09-03 MED ORDER — CHLORHEXIDINE GLUCONATE CLOTH 2 % EX PADS: 6.0000 | MEDICATED_PAD | Freq: Every day | CUTANEOUS | Status: DC

## 2018-09-03 MED ORDER — WARFARIN SODIUM 1 MG PO TABS
1.0000 mg | ORAL_TABLET | Freq: Once | ORAL | Status: AC
  Administered 2018-09-03: 1 mg via ORAL
  Filled 2018-09-03: qty 1

## 2018-09-03 MED ORDER — WARFARIN SODIUM 1 MG PO TABS: 1.0000 mg | ORAL_TABLET | ORAL | 0 refills | Status: DC

## 2018-09-03 NOTE — Progress Notes (Signed)
Internal Medicine Teaching Service Attending:   I saw and examined the patient. I reviewed the resident's note and I agree with the resident's findings and plan as documented in the resident's note.  Principal Problem:   Perinephric hematoma Active Problems:   ESRD (end stage renal disease) (HCC)   Morbid obesity (HCC)   Systolic CHF (HCC)   S/P aortic valve replacement with bileaflet mechanical valve   Acute blood loss anemia   Gross hematuria   Supratherapeutic INR  Hospital day #14 for this 53 year old man living with end-stage renal disease on hemodialysis admitted with a large perinephric hematoma stabilized status post IR embolization.  Requires anticoagulation for mechanical aortic valve replacement, doing well on Coumadin though he has high sensitivity to vitamin K antagonist.  INR 2.5 today.  Heparin infusion discontinued yesterday because of minor bleeding which is now resolved.  Working on disposition, patient has significant deconditioning due to prolonged hospitalization, currently requiring 2+ to assist for any out of bed activity.  PT recommending inpatient rehabilitation to we will consult.  Lalla Brothers, MD FACP

## 2018-09-03 NOTE — Progress Notes (Signed)
   Subjective: No acute events overnight. Abdominal pain well controlled. Continues to have loose stools which he notes has been a chronic issue for him. He does not currently have a PCP, but will establish care at discharge. His warfarin is managed by his cardiologist. He will plan to get INR checked at dialysis outpatient.   Objective:  Vital signs in last 24 hours: Vitals:   09/02/18 0922 09/02/18 1655 09/02/18 2111 09/03/18 0515  BP: 106/67 103/71 104/62 109/75  Pulse: 85 91 87 88  Resp: 18  17 20   Temp: 97.7 F (36.5 C) 98.1 F (36.7 C) 97.9 F (36.6 C) 98 F (36.7 C)  TempSrc: Oral Oral Oral Oral  SpO2: 97% 95% 90% 97%  Weight:      Height:       General: awake, alert, lying in bed in NAD Abd: obese, soft, mildly tender to palpation throughout   Assessment/Plan:  Principal Problem:   Perinephric hematoma Active Problems:   ESRD (end stage renal disease) (HCC)   Morbid obesity (HCC)   Systolic CHF (HCC)   S/P aortic valve replacement with bileaflet mechanical valve   Acute blood loss anemia   Gross hematuria   Supratherapeutic INR  Mr. Bellard is a 53 yo man with CAD s/p CABG, CHF, ESRD on HD, polycystic kidney disease, and aortic valve replacement on Coumadin who presented withabdominalpain and bleeding from the penissecondary to an expanding left-sided perinephric hematoma from PCKDin the setting ofsupratherapeutic INR.He continued to have bleeding and fluctuating INR despite holding warfarin and required total of 7 units of pRBCs. Underwent transcatheter embolization of the left kidney 5/13. Since the procedure, his hgb has remained stable. He was resumed back on Warfarin with heparin bridge until INR becomes therapeutic.   L perinephric hematoma: complicated by mechanical valve.  S/p embolization of left renal artery.   - INR remains therapeutic today  - hgb stable; administering IV feraheme for iron deficiency  - pain well controlled - patient was stable  for discharge home today. However, he required maximal assist with 2 people when trying to stand up. Greatly appreciate PT's recommendation of CIR which I agree with and will place the consult for.   ESRD on HD TTS: -appreciate nephrology managing inpatient HD  - will dialyze as scheduled at [facility] home fosrenol  Hypotension:Chronic hypotension with MAPs 60-65.Asymptomatic.Blood pressurestable. - Continue home midodrine 15mg  TID -Discontinued homemetoprolol  Malnutrition: Poor PO intake since admission.Seems more related to patient's distaste for hospital food, as he continues to endorse an appetite. - recommend outpatient work-up with PCP for chronic loose stools   Dispo: Anticipated discharge to CIR.   [name], DO 09/03/2018, 7:09 AM Pager: [phone]

## 2018-09-03 NOTE — Progress Notes (Signed)
Physical Therapy Treatment Patient Details Name: [name] [name] MRN: [id number] DOB: [birth year] Today's Date: 09/03/2018    History of Present Illness Mr. Unangst is a 53 yo man with CAD s/p CABG, CHF, ESRD on HD, polycystic kidney disease, and aortic valve replacement on Coumadin who presented with abdominal pain and bleeding from the penis secondary to an expanding left-sided perinephric hematoma from PCKD in the setting of supratherapeutic INR. He continued to have bleeding and fluctuating INR despite holding warfarin. Underwent transcatheter embolization of the left kidney 5/13.     PT Comments    Continuing work on functional mobility and activity tolerance;  Presents today with significant weakness, and persistent abdominal pain, which limits him; Required +2 assist to stand from elevated bed, heavy dependence on RW; He is young, has assistance at home, requires HD, and is motivated to return to prior level of function, and be able to walk; Recommend CIR for intensive therapies to maximize independence and safety with mobility; Discussed with Dr. Koleen Distance  Follow Up Recommendations  CIR     Equipment Recommendations  3in1 (PT);Other (comment)(Worth considering bariatric rollator)    Recommendations for Other Services OT consult;Rehab consult     Precautions / Restrictions Precautions Precautions: Fall    Mobility  Bed Mobility Overal bed mobility: Needs Assistance Bed Mobility: Supine to Sit     Supine to sit: Min assist;Mod assist     General bed mobility comments: min A to come to sitting; mod assist to help LEs back onto bed; dizzy upon initial sitting upright  Transfers Overall transfer level: Needs assistance Equipment used: Rolling walker (2 wheeled) Transfers: Sit to/from Stand;Lateral/Scoot Transfers Sit to Stand: +2 physical assistance;Max assist;From elevated surface        Lateral/Scoot Transfers: Mod assist General transfer comment: 2 attempts at sit to  stand with 2 person assist and bed elevated; first attempt unsuccessful; Second attempt, Mr. Ormond stood for less than 20 seconds; cues to shift weight forward to initiate; he indicated a fear of falling forward; Light mod assist with simulated lateral scoot transfers, scooting towards head of bed  Ambulation/Gait             General Gait Details: Unable today   Stairs             Wheelchair Mobility    Modified Rankin (Stroke Patients Only)       Balance Overall balance assessment: Needs assistance   Sitting balance-Leahy Scale: Fair                                      Cognition Arousal/Alertness: Awake/alert Behavior During Therapy: WFL for tasks assessed/performed Overall Cognitive Status: Within Functional Limits for tasks assessed                                        Exercises      General Comments General comments (skin integrity, edema, etc.): Took serial BPs as he described dizziness upon initial sitting up; BPs overall stable with position changes, unable to stand lon enough to get standing BP; see vitals flowsheet      Pertinent Vitals/Pain Pain Assessment: Faces Faces Pain Scale: Hurts little more Pain Location: abdominal pain Pain Descriptors / Indicators: Grimacing;Guarding Pain Intervention(s): Monitored during session;Repositioned    Home Living  Prior Function            PT Goals (current goals can now be found in the care plan section) Acute Rehab PT Goals Patient Stated Goal: go home when able PT Goal Formulation: With patient Time For Goal Achievement: 09/14/18 Potential to Achieve Goals: Good Progress towards PT goals: Progressing toward goals(May need to update goals over next few sessions)    Frequency    Min 3X/week      PT Plan Discharge plan needs to be updated    Co-evaluation              AM-PAC PT "6 Clicks" Mobility   Outcome Measure   Help needed turning from your back to your side while in a flat bed without using bedrails?: A Little Help needed moving from lying on your back to sitting on the side of a flat bed without using bedrails?: A Little Help needed moving to and from a bed to a chair (including a wheelchair)?: A Lot Help needed standing up from a chair using your arms (e.g., wheelchair or bedside chair)?: A Lot Help needed to walk in hospital room?: Total Help needed climbing 3-5 steps with a railing? : Total 6 Click Score: 12    End of Session Equipment Utilized During Treatment: Gait belt Activity Tolerance: Patient tolerated treatment well Patient left: in bed Nurse Communication: Mobility status PT Visit Diagnosis: Unsteadiness on feet (R26.81);Muscle weakness (generalized) (M62.81);Difficulty in walking, not elsewhere classified (R26.2)     Time: 0045-9977 PT Time Calculation (min) (ACUTE ONLY): 61 min  Charges:  $Therapeutic Activity: 53-67 mins                     Roney Marion, PT  Acute Rehabilitation Services Pager [phone] Office [place] 09/03/2018, 1:37 PM

## 2018-09-03 NOTE — Progress Notes (Signed)
[place] for warfarin Indication: Mechanical valve  No Known Allergies  Patient Measurements: Height: 6\' 1"  (185.4 cm) Weight: 235 lb 14.3 oz (107 kg) IBW/kg (Calculated) : 79.9  Vital Signs: Temp: 97.8 F (36.6 C) (05/18 0903) Temp Source: Oral (05/18 0903) BP: 133/66 (05/18 0903) Pulse Rate: 89 (05/18 0903)  Labs: Recent Labs    09/01/18 0359 09/01/18 0737 09/01/18 1605 09/02/18 0323 09/03/18 0318  HGB 7.2* 7.3*  --  7.0* 7.1*  HCT 23.5* 24.0*  --  23.0* 23.6*  PLT 342 329  --  319 337  LABPROT 20.3*  --   --  22.4* 26.8*  INR 1.8*  --   --  2.0* 2.5*  HEPARINUNFRC 0.26*  --  0.31 0.34  --   CREATININE  --  6.22*  --   --   --     Estimated Creatinine Clearance: 17.6 mL/min (A) (by C-G formula based on SCr of 6.22 mg/dL (H)).  Assessment: 63 YOM on warfarin PTA for mech aortic valve with subtherapeutic INR started on heparin with no bolus and tight goals d/t bleed risk with current hematuria. Patient now s/p embolization on 5/13. Last dose warfarin PTA was on 5/2 and patient is s/p 8 mg Vitamin K. INR goal has been decreased to 2-2.5  The patient's INR remains therapeutic today (INR 2.5 << 2, goal of 2-2.5). Hgb 7.1, plts wnl. Some penile bleeding noted - but not overt bleeding at this time. Possible d/c home soon.   Goal of Therapy:  INR 2-2.5 Monitor platelets by anticoagulation protocol: Yes   Plan:  - Warfarin 1 mg x 1 dose at 1800 today - Will continue to monitor for any signs/symptoms of bleeding and will follow up with PT/INR in the a.m.   Thank you for allowing pharmacy to be a part of this patient's care.  Alycia Rossetti, PharmD, BCPS Clinical Pharmacist Clinical phone for 09/03/2018: [phone] 09/03/2018 9:09 AM   **Pharmacist phone directory can now be found on [email or web address] ([email or web address]).  Listed under [place].

## 2018-09-03 NOTE — Progress Notes (Signed)
   09/03/18 1700  Clinical Encounter Type  Visited With Patient;Health care provider  Visit Type Initial;Psychological support  Referral From Nurse  Spiritual Encounters  Spiritual Needs Grief support;Emotional  Stress Factors  Patient Stress Factors Major life changes;Family relationships;Loss of control   Pt was disappointed that he didn't get d/c today as expected and instead will need therapy (presumable IP).  He misses him home and said that his wife and daughter miss him being there w/ them.    Also, of note, pt has experienced many deaths over the last 21 months/2 yrs.  Last year, an older brother died as well as 2-3 uncles.  Last Thursday, the brother just older than he (born in same month, 1 or 2 yrs apart) died suddenly.  [name] [name] was yesterday and pt was here, unable to go.  Empathetic listening; supported lament.  Called away to ED; chaplain remains available via pg or consult.  [name] [name] resident, [phone]

## 2018-09-03 NOTE — Progress Notes (Signed)
[facility] ROUNDING NOTE   Subjective:   This is a 53 year old gentleman end-stage renal disease secondary to polycystic kidney disease, currently on dialysis Tuesday Thursday Saturday at [facility] clinic followed by Dr. Lowanda Foster.  He has a history of chronic hypotension and continues on Midodrin.  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  He has congestive heart failure with systolic dysfunction with an ejection fraction about 30% per 2D echo 10/25/2017. He presented with a left-sided perinephric hematoma in the setting of a supratherapeutic INR required a total of 7 units packed red blood cells and transcatheter embolization of left kidney 08/29/2018.  Patient is hoping to go home today.  Blood pressure 109/75 pulse 88 temperature 98 O2 sats 97% room air.  Sodium 134 potassium 3.8 chloride 93 CO2 25 BUN 40 creatinine 6.22 glucose 68 calcium 8.2 phosphorus 4.4 albumin 1.4.  WBC 9.7 hemoglobin 7.1 platelets 337.  TSH 1.8 10/25/2017.  Iron saturations 14% 08/23/2018 INR 2.5 09/03/2018  Amiodarone 200 mg daily atorvastatin 80 mg daily Midrin 50 mg 3 times daily Fosrenol 1 g with meals 500 mg with snacks, Aranesp 100 mcg q. Tuesday, Coumadin per INR, iron Feraheme 510 mg x 1 09/03/2018    Objective:  Vital signs in last 24 hours:  Temp:  [97.7 F (36.5 C)-98.1 F (36.7 C)] 98 F (36.7 C) (05/18 0515) Pulse Rate:  [85-91] 88 (05/18 0515) Resp:  [17-20] 20 (05/18 0515) BP: (103-109)/(62-75) 109/75 (05/18 0515) SpO2:  [90 %-97 %] 97 % (05/18 0515)  Weight change:  Filed Weights   09/01/18 0700 09/01/18 1105 09/01/18 2058  Weight: 108.7 kg 107.1 kg 107 kg    Intake/Output: I/O last 3 completed shifts: In: 1080 [P.O.:940; I.V.:140] Out: 0    Intake/Output this shift:  No intake/output data recorded.  Alert pleasant gentleman obese nondistressed CVS- RRR no elevated JVP prosthetic heart sound RS-  CTA ABD- BS present soft non-distended EXT- no edema left AV fistula with thrill and bruit   Basic Metabolic Panel: Recent Labs  Lab 08/28/18 0326 08/29/18 0400 08/30/18 0501 09/01/18 0737  NA 133* 135 134* 134*  K 4.2 3.6 4.0 3.8  CL 91* 96* 95* 93*  CO2 26 28 26 25   GLUCOSE 104* 86 62* 68*  BUN 65* 37* 51* 40*  CREATININE 7.78* 5.66* 7.06* 6.22*  CALCIUM 8.1* 8.3* 8.1* 8.2*  PHOS 5.9* 4.4 5.5* 4.4    Liver Function Tests: Recent Labs  Lab 08/28/18 0326 08/29/18 0400 08/30/18 0501 09/01/18 0737  ALBUMIN 1.4* 1.4* 1.4* 1.4*   No results for input(s): LIPASE, AMYLASE in the last 168 hours. No results for input(s): AMMONIA in the last 168 hours.  CBC: Recent Labs  Lab 08/31/18 0309 09/01/18 0359 09/01/18 0737 09/02/18 0323 09/03/18 0318  WBC 17.4* 14.2* 13.2* 10.3 9.7  HGB 7.3* 7.2* 7.3* 7.0* 7.1*  HCT 24.5* 23.5* 24.0* 23.0* 23.6*  MCV 89.1 87.4 86.6 87.5 87.7  PLT 320 342 329 319 337    Cardiac Enzymes: No results for input(s): CKTOTAL, CKMB, CKMBINDEX, TROPONINI in the last 168 hours.  BNP: Invalid input(s): POCBNP  CBG: No results for input(s): GLUCAP in the last 168 hours.  Microbiology: Results for orders placed or performed during the hospital encounter of 08/19/18  SARS Coronavirus 2 (CEPHEID - Performed in [facility] hospital lab), Hosp Order     Status: None   Collection Time: 08/19/18  5:11 PM  Result Value Ref Range Status   SARS Coronavirus 2 NEGATIVE NEGATIVE Final    Comment: (NOTE) If result is NEGATIVE SARS-CoV-2 target nucleic acids are NOT DETECTED. The SARS-CoV-2 RNA is generally detectable in upper and lower  respiratory specimens during the acute phase of infection. The lowest  concentration of SARS-CoV-2 viral copies this assay can detect is 250  copies / mL. A negative result does not preclude SARS-CoV-2 infection  and should not be used as the sole basis for treatment or other  patient management decisions.  A negative  result may occur with  improper specimen collection / handling, submission of specimen other  than nasopharyngeal swab, presence of viral mutation(s) within the  areas targeted by this assay, and inadequate number of viral copies  (<250 copies / mL). A negative result must be combined with clinical  observations, patient history, and epidemiological information. If result is POSITIVE SARS-CoV-2 target nucleic acids are DETECTED. The SARS-CoV-2 RNA is generally detectable in upper and lower  respiratory specimens dur ing the acute phase of infection.  Positive  results are indicative of active infection with SARS-CoV-2.  Clinical  correlation with patient history and other diagnostic information is  necessary to determine patient infection status.  Positive results do  not rule out bacterial infection or co-infection with other viruses. If result is PRESUMPTIVE POSTIVE SARS-CoV-2 nucleic acids MAY BE PRESENT.   A presumptive positive result was obtained on the submitted specimen  and confirmed on repeat testing.  While 2019 novel coronavirus  (SARS-CoV-2) nucleic acids may be present in the submitted sample  additional confirmatory testing may be necessary for epidemiological  and / or clinical management purposes  to differentiate between  SARS-CoV-2 and other Sarbecovirus currently known to infect humans.  If clinically indicated additional testing with an alternate test  methodology [phone]) is advised. The SARS-CoV-2 RNA is generally  detectable in upper and lower respiratory sp ecimens during the acute  phase of infection. The expected result is Negative. Fact Sheet for Patients:  StrictlyIdeas.no Fact Sheet for Healthcare Providers: BankingDealers.co.za This test is not yet approved or cleared by the [place] FDA and has been authorized for detection and/or diagnosis of SARS-CoV-2 by FDA under an Emergency Use Authorization  (EUA).  This EUA will remain in effect (meaning this test can be used) for the duration of the COVID-19 declaration under Section 564(b)(1) of the Act, 21 U.S.C. section 360bbb-3(b)(1), unless the authorization is terminated or revoked sooner. Performed at [facility], [place] [address] [place], [place] [address]     Coagulation Studies: Recent Labs    09/01/18 0359 09/02/18 0323 09/03/18 0318  LABPROT 20.3* 22.4* 26.8*  INR 1.8* 2.0* 2.5*    Urinalysis: No results for input(s): COLORURINE, LABSPEC, PHURINE, GLUCOSEU, HGBUR, BILIRUBINUR, KETONESUR, PROTEINUR, UROBILINOGEN, NITRITE, LEUKOCYTESUR in the last 72 hours.  Invalid input(s): APPERANCEUR    Imaging: No results found.   Medications:   . ferumoxytol     . sodium chloride   Intravenous Once  . acetaminophen  650 mg Oral Q6H  . amiodarone  200 mg Oral Daily  . atorvastatin  80 mg Oral q1800  . Chlorhexidine Gluconate Cloth  6 each Topical Q0600  . Chlorhexidine Gluconate Cloth  6 each Topical Q0600  . darbepoetin (ARANESP) injection - DIALYSIS  100 mcg Intravenous Q Tue-HD  . feeding supplement (NEPRO CARB STEADY)  237 mL Oral BID BM  . feeding supplement (PRO-STAT SUGAR FREE 64)  30 mL Oral  TID  . lanthanum  1,000 mg Oral TID PC  . midodrine  15 mg Oral TID WC  . multivitamin  1 tablet Oral QHS  . sodium chloride flush  10-40 mL Intracatheter Q12H  . Warfarin - Pharmacist Dosing Inpatient   Does not apply q1800   acetaminophen **OR** acetaminophen, HYDROmorphone (DILAUDID) injection, iohexol, lanthanum  Assessment/ Plan:   ESRD-Tuesday Thursday dialysis currently dialyzes at [facility] unit.  He appears stable for discharge to me with no change in hemoglobin.  He can follow-up with his nephrologist at his kidney center on discharge.  If he does stay we will schedule dialysis for tomorrow  ANEMIA-patient has received IV iron and darbepoetin.  Acute blood loss anemia secondary to perinephric  hematoma.  Status post embolization.  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  Continues on binders that are non-calcium based  HTN/VOL-congestive heart failure with ejection fraction 30% midodrine 15 mg 3 times daily for blood pressure support.  About 1 L of fluid removed 09/01/2018  Coronary artery disease status status post CABG 2019 history of systolic heart failure.  Hyperlipidemia on statins  AVR with admission INR supratherapeutic retroperitoneal bleed  Recurrent left perinephric hematoma with supratherapeutic INR status post ablation  Atrial fibrillation continues on amiodarone and anticoagulation   LOS: [place] @TODAY @7 :53 AM

## 2018-09-04 ENCOUNTER — Inpatient Hospital Stay (HOSPITAL_COMMUNITY)
Admission: RE | Admit: 2018-09-04 | Discharge: 2018-09-18 | DRG: 945 | Disposition: A | Discharge status: Other Acute Inpatient Hospital | Payer: Medicare Other | Source: Intra-hospital | Attending: Physical Medicine & Rehabilitation | Admitting: Physical Medicine & Rehabilitation

## 2018-09-04 ENCOUNTER — Other Ambulatory Visit: Payer: Self-pay

## 2018-09-04 DIAGNOSIS — R0989 Other specified symptoms and signs involving the circulatory and respiratory systems: Secondary | ICD-10-CM

## 2018-09-04 DIAGNOSIS — R1084 Generalized abdominal pain: Secondary | ICD-10-CM | POA: Diagnosis not present

## 2018-09-04 DIAGNOSIS — I462 Cardiac arrest due to underlying cardiac condition: Secondary | ICD-10-CM | POA: Diagnosis not present

## 2018-09-04 DIAGNOSIS — G9340 Encephalopathy, unspecified: Secondary | ICD-10-CM | POA: Diagnosis not present

## 2018-09-04 DIAGNOSIS — Z992 Dependence on renal dialysis: Secondary | ICD-10-CM | POA: Diagnosis not present

## 2018-09-04 DIAGNOSIS — I951 Orthostatic hypotension: Secondary | ICD-10-CM | POA: Diagnosis present

## 2018-09-04 DIAGNOSIS — I2510 Atherosclerotic heart disease of native coronary artery without angina pectoris: Secondary | ICD-10-CM | POA: Diagnosis present

## 2018-09-04 DIAGNOSIS — D638 Anemia in other chronic diseases classified elsewhere: Secondary | ICD-10-CM

## 2018-09-04 DIAGNOSIS — Z6837 Body mass index (BMI) 37.0-37.9, adult: Secondary | ICD-10-CM

## 2018-09-04 DIAGNOSIS — Q613 Polycystic kidney, unspecified: Secondary | ICD-10-CM | POA: Diagnosis not present

## 2018-09-04 DIAGNOSIS — E785 Hyperlipidemia, unspecified: Secondary | ICD-10-CM | POA: Diagnosis present

## 2018-09-04 DIAGNOSIS — Z951 Presence of aortocoronary bypass graft: Secondary | ICD-10-CM | POA: Diagnosis not present

## 2018-09-04 DIAGNOSIS — Z7901 Long term (current) use of anticoagulants: Secondary | ICD-10-CM

## 2018-09-04 DIAGNOSIS — Z87891 Personal history of nicotine dependence: Secondary | ICD-10-CM

## 2018-09-04 DIAGNOSIS — R791 Abnormal coagulation profile: Secondary | ICD-10-CM | POA: Diagnosis present

## 2018-09-04 DIAGNOSIS — R627 Adult failure to thrive: Secondary | ICD-10-CM | POA: Diagnosis present

## 2018-09-04 DIAGNOSIS — R5381 Other malaise: Principal | ICD-10-CM | POA: Diagnosis present

## 2018-09-04 DIAGNOSIS — F419 Anxiety disorder, unspecified: Secondary | ICD-10-CM | POA: Diagnosis present

## 2018-09-04 DIAGNOSIS — N2889 Other specified disorders of kidney and ureter: Secondary | ICD-10-CM | POA: Diagnosis present

## 2018-09-04 DIAGNOSIS — I953 Hypotension of hemodialysis: Secondary | ICD-10-CM | POA: Diagnosis not present

## 2018-09-04 DIAGNOSIS — Z952 Presence of prosthetic heart valve: Secondary | ICD-10-CM

## 2018-09-04 DIAGNOSIS — E43 Unspecified severe protein-calorie malnutrition: Secondary | ICD-10-CM | POA: Diagnosis not present

## 2018-09-04 DIAGNOSIS — N186 End stage renal disease: Secondary | ICD-10-CM | POA: Diagnosis not present

## 2018-09-04 DIAGNOSIS — R222 Localized swelling, mass and lump, trunk: Secondary | ICD-10-CM | POA: Diagnosis not present

## 2018-09-04 DIAGNOSIS — R229 Localized swelling, mass and lump, unspecified: Secondary | ICD-10-CM | POA: Diagnosis not present

## 2018-09-04 DIAGNOSIS — X58XXXD Exposure to other specified factors, subsequent encounter: Secondary | ICD-10-CM | POA: Diagnosis present

## 2018-09-04 DIAGNOSIS — Z8249 Family history of ischemic heart disease and other diseases of the circulatory system: Secondary | ICD-10-CM | POA: Diagnosis not present

## 2018-09-04 DIAGNOSIS — I132 Hypertensive heart and chronic kidney disease with heart failure and with stage 5 chronic kidney disease, or end stage renal disease: Secondary | ICD-10-CM | POA: Diagnosis present

## 2018-09-04 DIAGNOSIS — Z79899 Other long term (current) drug therapy: Secondary | ICD-10-CM | POA: Diagnosis not present

## 2018-09-04 DIAGNOSIS — I080 Rheumatic disorders of both mitral and aortic valves: Secondary | ICD-10-CM | POA: Diagnosis present

## 2018-09-04 DIAGNOSIS — D631 Anemia in chronic kidney disease: Secondary | ICD-10-CM | POA: Diagnosis present

## 2018-09-04 DIAGNOSIS — R0902 Hypoxemia: Secondary | ICD-10-CM | POA: Diagnosis not present

## 2018-09-04 DIAGNOSIS — IMO0002 Reserved for concepts with insufficient information to code with codable children: Secondary | ICD-10-CM

## 2018-09-04 DIAGNOSIS — I472 Ventricular tachycardia: Secondary | ICD-10-CM | POA: Diagnosis not present

## 2018-09-04 DIAGNOSIS — Z515 Encounter for palliative care: Secondary | ICD-10-CM

## 2018-09-04 DIAGNOSIS — I5022 Chronic systolic (congestive) heart failure: Secondary | ICD-10-CM | POA: Diagnosis present

## 2018-09-04 DIAGNOSIS — I4891 Unspecified atrial fibrillation: Secondary | ICD-10-CM | POA: Diagnosis present

## 2018-09-04 DIAGNOSIS — D62 Acute posthemorrhagic anemia: Secondary | ICD-10-CM | POA: Diagnosis present

## 2018-09-04 DIAGNOSIS — K921 Melena: Secondary | ICD-10-CM | POA: Diagnosis not present

## 2018-09-04 DIAGNOSIS — N2581 Secondary hyperparathyroidism of renal origin: Secondary | ICD-10-CM | POA: Diagnosis not present

## 2018-09-04 DIAGNOSIS — I9589 Other hypotension: Secondary | ICD-10-CM | POA: Diagnosis present

## 2018-09-04 DIAGNOSIS — I120 Hypertensive chronic kidney disease with stage 5 chronic kidney disease or end stage renal disease: Secondary | ICD-10-CM | POA: Diagnosis not present

## 2018-09-04 DIAGNOSIS — L89893 Pressure ulcer of other site, stage 3: Secondary | ICD-10-CM | POA: Diagnosis present

## 2018-09-04 DIAGNOSIS — S37012D Minor contusion of left kidney, subsequent encounter: Secondary | ICD-10-CM

## 2018-09-04 DIAGNOSIS — Z7189 Other specified counseling: Secondary | ICD-10-CM

## 2018-09-04 DIAGNOSIS — I469 Cardiac arrest, cause unspecified: Secondary | ICD-10-CM | POA: Diagnosis present

## 2018-09-04 DIAGNOSIS — Z789 Other specified health status: Secondary | ICD-10-CM

## 2018-09-04 LAB — RENAL FUNCTION PANEL
Albumin: 1.4 g/dL — ABNORMAL LOW (ref 3.5–5.0)
Anion gap: 14 (ref 5–15)
BUN: 55 mg/dL — ABNORMAL HIGH (ref 6–20)
CO2: 24 mmol/L (ref 22–32)
Calcium: 8 mg/dL — ABNORMAL LOW (ref 8.9–10.3)
Chloride: 97 mmol/L — ABNORMAL LOW (ref 98–111)
Creatinine, Ser: 7.38 mg/dL — ABNORMAL HIGH (ref 0.61–1.24)
GFR calc Af Amer: 9 mL/min — ABNORMAL LOW (ref >60)
GFR calc non Af Amer: 8 mL/min — ABNORMAL LOW (ref >60)
Glucose, Bld: 70 mg/dL (ref 70–99)
Phosphorus: 5.2 mg/dL — ABNORMAL HIGH (ref 2.5–4.6)
Potassium: 3.6 mmol/L (ref 3.5–5.1)
Sodium: 135 mmol/L (ref 135–145)

## 2018-09-04 LAB — CBC
HCT: 23.7 % — ABNORMAL LOW (ref 39.0–52.0)
Hemoglobin: 7.1 g/dL — ABNORMAL LOW (ref 13.0–17.0)
MCH: 26.1 pg (ref 26.0–34.0)
MCHC: 30 g/dL (ref 30.0–36.0)
MCV: 87.1 fL (ref 80.0–100.0)
Platelets: 330 10*3/uL (ref 150–400)
RBC: 2.72 MIL/uL — ABNORMAL LOW (ref 4.22–5.81)
RDW: 17.9 % — ABNORMAL HIGH (ref 11.5–15.5)
WBC: 9.4 10*3/uL (ref 4.0–10.5)
nRBC: 0 % (ref 0.0–0.2)

## 2018-09-04 LAB — PROTIME-INR
INR: 3 — ABNORMAL HIGH (ref 0.8–1.2)
Prothrombin Time: 30.4 seconds — ABNORMAL HIGH (ref 11.4–15.2)

## 2018-09-04 MED ORDER — SODIUM CHLORIDE 0.9 % IV SOLN: 100.0000 mL | INTRAVENOUS | Status: DC | PRN

## 2018-09-04 MED ORDER — ALUM & MAG HYDROXIDE-SIMETH 200-200-20 MG/5ML PO SUSP: 30.0000 mL | ORAL | Status: DC | PRN

## 2018-09-04 MED ORDER — PROCHLORPERAZINE MALEATE 5 MG PO TABS
5.0000 mg | ORAL_TABLET | Freq: Four times a day (QID) | ORAL | Status: DC | PRN
  Administered 2018-09-18: 10 mg via ORAL
  Filled 2018-09-04: qty 2

## 2018-09-04 MED ORDER — LIDOCAINE-PRILOCAINE 2.5-2.5 % EX CREA: 1.0000 "application " | TOPICAL_CREAM | CUTANEOUS | Status: DC | PRN

## 2018-09-04 MED ORDER — PRO-STAT SUGAR FREE PO LIQD
30.0000 mL | Freq: Three times a day (TID) | ORAL | Status: DC
  Administered 2018-09-04 – 2018-09-18 (×28): 30 mL via ORAL
  Filled 2018-09-04 (×32): qty 30

## 2018-09-04 MED ORDER — ACETAMINOPHEN 325 MG PO TABS
325.0000 mg | ORAL_TABLET | ORAL | Status: DC | PRN
  Filled 2018-09-04: qty 1

## 2018-09-04 MED ORDER — RENA-VITE PO TABS
1.0000 | ORAL_TABLET | Freq: Every day | ORAL | Status: DC
  Administered 2018-09-04 – 2018-09-17 (×13): 1 via ORAL
  Filled 2018-09-04 (×14): qty 1

## 2018-09-04 MED ORDER — ALTEPLASE 2 MG IJ SOLR: 2.0000 mg | Freq: Once | INTRAMUSCULAR | Status: DC | PRN

## 2018-09-04 MED ORDER — LIDOCAINE HCL (PF) 1 % IJ SOLN: 5.0000 mL | INTRAMUSCULAR | Status: DC | PRN

## 2018-09-04 MED ORDER — TRAMADOL HCL 50 MG PO TABS
50.0000 mg | ORAL_TABLET | Freq: Four times a day (QID) | ORAL | Status: DC | PRN
  Administered 2018-09-08 – 2018-09-18 (×2): 50 mg via ORAL
  Filled 2018-09-04: qty 1

## 2018-09-04 MED ORDER — DIPHENHYDRAMINE HCL 12.5 MG/5ML PO ELIX: 12.5000 mg | ORAL_SOLUTION | Freq: Four times a day (QID) | ORAL | Status: DC | PRN

## 2018-09-04 MED ORDER — ACETAMINOPHEN 325 MG PO TABS
650.0000 mg | ORAL_TABLET | Freq: Four times a day (QID) | ORAL | Status: DC
  Administered 2018-09-04 – 2018-09-08 (×17): 650 mg via ORAL
  Administered 2018-09-09: 325 mg via ORAL
  Administered 2018-09-09 – 2018-09-18 (×30): 650 mg via ORAL
  Filled 2018-09-04 (×50): qty 2

## 2018-09-04 MED ORDER — LANTHANUM CARBONATE 500 MG PO CHEW
1000.0000 mg | CHEWABLE_TABLET | Freq: Three times a day (TID) | ORAL | Status: DC
  Administered 2018-09-04 – 2018-09-15 (×2): 1000 mg via ORAL
  Filled 2018-09-04 (×45): qty 2

## 2018-09-04 MED ORDER — PROCHLORPERAZINE 25 MG RE SUPP: 12.5000 mg | Freq: Four times a day (QID) | RECTAL | Status: DC | PRN

## 2018-09-04 MED ORDER — PROCHLORPERAZINE EDISYLATE 10 MG/2ML IJ SOLN
5.0000 mg | Freq: Four times a day (QID) | INTRAMUSCULAR | Status: DC | PRN
  Administered 2018-09-15 (×2): 10 mg via INTRAMUSCULAR
  Filled 2018-09-04 (×2): qty 2

## 2018-09-04 MED ORDER — HEPARIN SODIUM (PORCINE) 1000 UNIT/ML DIALYSIS: 1000.0000 [IU] | INTRAMUSCULAR | Status: DC | PRN

## 2018-09-04 MED ORDER — BISACODYL 10 MG RE SUPP: 10.0000 mg | Freq: Every day | RECTAL | Status: DC | PRN

## 2018-09-04 MED ORDER — DARBEPOETIN ALFA 100 MCG/0.5ML IJ SOSY
PREFILLED_SYRINGE | INTRAMUSCULAR | Status: AC
  Administered 2018-09-04: 100 ug via INTRAVENOUS
  Filled 2018-09-04: qty 0.5

## 2018-09-04 MED ORDER — ATORVASTATIN CALCIUM 80 MG PO TABS
80.0000 mg | ORAL_TABLET | Freq: Every day | ORAL | Status: DC
  Administered 2018-09-04 – 2018-09-17 (×13): 80 mg via ORAL
  Filled 2018-09-04 (×13): qty 1

## 2018-09-04 MED ORDER — MIDODRINE HCL 5 MG PO TABS
ORAL_TABLET | ORAL | Status: AC
  Administered 2018-09-04: 15 mg via ORAL
  Filled 2018-09-04: qty 3

## 2018-09-04 MED ORDER — NEPRO/CARBSTEADY PO LIQD
237.0000 mL | Freq: Two times a day (BID) | ORAL | Status: DC
  Administered 2018-09-06 – 2018-09-13 (×3): 237 mL via ORAL

## 2018-09-04 MED ORDER — DARBEPOETIN ALFA 100 MCG/0.5ML IJ SOSY
100.0000 ug | PREFILLED_SYRINGE | INTRAMUSCULAR | Status: DC
  Administered 2018-09-11: 100 ug via INTRAVENOUS
  Filled 2018-09-04: qty 0.5

## 2018-09-04 MED ORDER — POLYETHYLENE GLYCOL 3350 17 G PO PACK
17.0000 g | PACK | Freq: Every day | ORAL | Status: DC | PRN
  Administered 2018-09-12: 17 g via ORAL
  Filled 2018-09-04 (×2): qty 1

## 2018-09-04 MED ORDER — WARFARIN SODIUM 1 MG PO TABS: 1.0000 mg | ORAL_TABLET | ORAL | 0 refills | Status: AC

## 2018-09-04 MED ORDER — AMIODARONE HCL 200 MG PO TABS
200.0000 mg | ORAL_TABLET | Freq: Every day | ORAL | Status: DC
  Administered 2018-09-05 – 2018-09-18 (×14): 200 mg via ORAL
  Filled 2018-09-04 (×14): qty 1

## 2018-09-04 MED ORDER — PENTAFLUOROPROP-TETRAFLUOROETH EX AERO: 1.0000 "application " | INHALATION_SPRAY | CUTANEOUS | Status: DC | PRN

## 2018-09-04 MED ORDER — WARFARIN - PHARMACIST DOSING INPATIENT
Freq: Every day | Status: DC
  Administered 2018-09-11 – 2018-09-15 (×3)

## 2018-09-04 MED ORDER — MIDODRINE HCL 5 MG PO TABS
15.0000 mg | ORAL_TABLET | Freq: Three times a day (TID) | ORAL | Status: DC
  Administered 2018-09-04 – 2018-09-18 (×42): 15 mg via ORAL
  Filled 2018-09-04 (×41): qty 3

## 2018-09-04 MED ORDER — TRAZODONE HCL 50 MG PO TABS
25.0000 mg | ORAL_TABLET | Freq: Every evening | ORAL | Status: DC | PRN
  Administered 2018-09-11: 23:00:00 50 mg via ORAL
  Filled 2018-09-04: qty 1

## 2018-09-04 MED ORDER — GUAIFENESIN-DM 100-10 MG/5ML PO SYRP: 5.0000 mL | ORAL_SOLUTION | Freq: Four times a day (QID) | ORAL | Status: DC | PRN

## 2018-09-04 MED ORDER — LANTHANUM CARBONATE 500 MG PO CHEW
500.0000 mg | CHEWABLE_TABLET | ORAL | Status: DC | PRN
  Filled 2018-09-04: qty 1

## 2018-09-04 MED ORDER — CHLORHEXIDINE GLUCONATE CLOTH 2 % EX PADS
6.0000 | MEDICATED_PAD | Freq: Every day | CUTANEOUS | Status: DC
  Administered 2018-09-05 – 2018-09-06 (×2): 6 via TOPICAL

## 2018-09-04 MED ORDER — OXYCODONE HCL 5 MG PO TABS
5.0000 mg | ORAL_TABLET | ORAL | Status: DC | PRN
  Administered 2018-09-04: 10 mg via ORAL
  Administered 2018-09-05: 5 mg via ORAL
  Administered 2018-09-05 – 2018-09-06 (×2): 10 mg via ORAL
  Administered 2018-09-06: 5 mg via ORAL
  Administered 2018-09-07: 10 mg via ORAL
  Filled 2018-09-04: qty 2
  Filled 2018-09-04 (×2): qty 1
  Filled 2018-09-04: qty 2
  Filled 2018-09-04 (×2): qty 1
  Filled 2018-09-04: qty 2

## 2018-09-04 NOTE — PMR Pre-admission (Signed)
PMR Admission Coordinator Pre-Admission Assessment  Patient: [name] [name] is an 53 y.o., male MRN: [id number] DOB: [birth year] Height: '6\' 1"'$  (185.4 cm) Weight: 130 kg  Insurance Information HMO:     PPO:      PCP:      IPA:      80/20:      OTHER:  PRIMARY: Medicare A and B      Policy#: [id number]      Subscriber: patient CM Name:       Phone#:      Fax#:  Pre-Cert#: verified online      Employer:  Benefits:  Phone #:      Name:  Eff. Date: 08/17/2002     Deduct: $1408      Out of Pocket Max: n/a      Life Max: n/a CIR: 100%      SNF: 20 full days Outpatient: 80%     Co-Pay: 20% Home Health: 100%      Co-Pay:  DME: 80%     Co-Pay: 20% Providers: pts choice SECONDARY: BCBS Supplement      Policy#: [id number]      Subscriber: patient Benefits:  Phone #: [phone]   Medicaid Application Date:       Case Manager:  Disability Application Date:       Case Worker:   The "Data Collection Information Summary" for patients in Inpatient Rehabilitation Facilities with attached "Privacy Act [place] Records" was provided and verbally reviewed with: Patient  Emergency Contact Information Contact Information    Name Relation Home Work Mobile   [name] [name] [phone]        Current Medical History  Patient Admitting Diagnosis: debility with perinephric hematoma  History of Present Illness: [name] [name] is a 53 y.o. male with history of ESRD- HD TTS, HTN, CAD/AS s/p CABG with mechanical aortic valve and chronic coumadin, recent admission 4/15-4/21 for gross hematuria and abdominal pain due to supratherapeutic INR and ruptured hemorrhagic cyst. He was readmitted on 08/19/18 with tachycardia, fall a couple of days PTA with LUQ and flank pain radiating to the back and hematuria. CT abdomen at OSH showed increase in size of perinephric hematoma. INR noted to be supratherapeutic at 5.5 with Hgb 7.6. INR reversed and he was transfused with one unit PRBC.  Urology  recommended supportive care with bed rest and serial H/H till hgb stable--if refractory would require embolization. He continued to have ongoing blood loss with pain requiring 7 units of PRBC. Radiology consulted for assistance and patient underwent left renal embolization by Dr. Pascal Lux on 5/13. Coumadin resumed and INR therapeutic. He was weaned off PCA and PT initiated. He has been unable to tolerate standing due to BLE weakness, dizziness as well as severe pain in abdomen and back. CIR recommended due to functional decline.     Patient's medical record from [facility] has been reviewed by the rehabilitation admission coordinator and physician.  Past Medical History  Past Medical History:  Diagnosis Date  . Aneurysm ([place])     Right arm fistula 3 aneurysms 2011,   plans to have a new procedure  . Angina   . Chest discomfort   . CHF (congestive heart failure) ([place])   . Coronary artery disease   . Coronary artery disease involving native coronary artery of native heart without angina pectoris   . Dialysis patient ([place])   . Dysrhythmia   . ESRD (end stage renal disease) ([place])  on hemodialysis T_T_S  . Hypertension   . Leg pain   . Mitral regurgitation   . Morbid obesity ([place])   . Orthostatic hypotension   . Overweight(278.02)   . Renal insufficiency   . 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 Severe aortic insufficiency   . Sinus tachycardia   . Syncope     positional after dialysis... 2008    Family History   family history includes Heart disease in his father and mother; Hypertension in his father and mother.  Prior Rehab/Hospitalizations Has the patient had prior rehab or hospitalizations prior to admission? Yes  Has the patient had major surgery during 100 days prior to admission? Yes   Current Medications  Current  Facility-Administered Medications:  .  0.9 %  sodium chloride infusion (Manually program via Guardrails IV Fluids), , Intravenous, Once, Alfonse Spruce, Jamie M, MD .  0.9 %  sodium chloride infusion, 100 mL, Intravenous, PRN, Edrick Oh, MD .  0.9 %  sodium chloride infusion, 100 mL, Intravenous, PRN, Edrick Oh, MD .  acetaminophen (TYLENOL) tablet 650 mg, 650 mg, Oral, Q6H PRN, 650 mg at 08/21/18 1412 **OR** acetaminophen (TYLENOL) suppository 650 mg, 650 mg, Rectal, Q6H PRN, [name], MD .  acetaminophen (TYLENOL) tablet 650 mg, 650 mg, Oral, Q6H, Dorrell, [name], MD, 650 mg at 09/03/18 2200 .  alteplase (CATHFLO ACTIVASE) injection 2 mg, 2 mg, Intracatheter, Once PRN, Edrick Oh, MD .  amiodarone (PACERONE) tablet 200 mg, 200 mg, Oral, Daily, Winfrey, William B, MD, 200 mg at 09/03/18 0810 .  atorvastatin (LIPITOR) tablet 80 mg, 80 mg, Oral, q1800, [name], MD, 80 mg at 09/03/18 1641 .  Chlorhexidine Gluconate Cloth 2 % PADS 6 each, 6 each, Topical, Q0600, Justin Mend, MD, 6 each at 09/04/18 [phone] .  Darbepoetin Alfa (ARANESP) injection 100 mcg, 100 mcg, Intravenous, Q Tue-HD, Ernest Haber, PA-C, 100 mcg at 09/04/18 0936 .  feeding supplement (NEPRO CARB STEADY) liquid 237 mL, 237 mL, Oral, BID BM, Oda Kilts, MD, 237 mL at 08/29/18 1417 .  feeding supplement (PRO-STAT SUGAR FREE 64) liquid 30 mL, 30 mL, Oral, TID, Oda Kilts, MD, 30 mL at 09/03/18 2200 .  heparin injection 1,000 Units, 1,000 Units, Dialysis, PRN, Edrick Oh, MD .  HYDROmorphone (DILAUDID) injection 1 mg, 1 mg, Intravenous, Q4H PRN, Dorrell, [name], MD, 1 mg at 09/03/18 1649 .  iohexol (OMNIPAQUE) 300 MG/ML solution 15 mL, 15 mL, Intravenous, Once PRN, Sandi Mariscal, MD .  lanthanum [facility]) chewable tablet 1,000 mg, 1,000 mg, Oral, TID PC, [name], MD, 1,000 mg at 08/27/18 1703 .  lanthanum (FOSRENOL) chewable tablet 500 mg, 500 mg, Oral, PRN, Oda Kilts,  MD .  lidocaine (PF) (XYLOCAINE) 1 % injection 5 mL, 5 mL, Intradermal, PRN, Edrick Oh, MD .  lidocaine-prilocaine (EMLA) cream 1 application, 1 application, Topical, PRN, Edrick Oh, MD .  midodrine (PROAMATINE) tablet 15 mg, 15 mg, Oral, TID WC, [name], MD, 15 mg at 09/04/18 0714 .  multivitamin (RENA-VIT) tablet 1 tablet, 1 tablet, Oral, QHS, Valentina Gu, NP, 1 tablet at 09/03/18 2200 .  pentafluoroprop-tetrafluoroeth (GEBAUERS) aerosol 1 application, 1 application, Topical, PRN, Edrick Oh, MD .  sodium chloride flush (NS) 0.9 % injection 10-40 mL, 10-40 mL, Intracatheter, Q12H, Oda Kilts,  MD, 10 mL at 09/03/18 1106 .  Warfarin - Pharmacist Dosing Inpatient, , Does not apply, q1800, Theotis Burrow, RPH  Patients Current Diet:  Diet Order            Diet - low sodium heart healthy        Diet renal with fluid restriction Fluid restriction: 1200 mL Fluid; Room service appropriate? Yes; Fluid consistency: Thin  Diet effective now              Precautions / Restrictions Precautions Precautions: Fall Restrictions Weight Bearing Restrictions: No   Has the patient had 2 or more falls or a fall with injury in the past year? Yes  Prior Activity Level Community (5-7x/wk): went out daily, occasionally driving, HD T/Th/Sa  Prior Functional Level Self Care: Did the patient need help bathing, dressing, using the toilet or eating? Independent  Indoor Mobility: Did the patient need assistance with walking from room to room (with or without device)? Independent  Stairs: Did the patient need assistance with internal or external stairs (with or without device)? Independent  Functional Cognition: Did the patient need help planning regular tasks such as shopping or remembering to take medications? [place] / Equipment Home Assistive Devices/Equipment: Bedside commode/3-in-1, Cane (specify quad or straight), Grab bars in  shower, Walker (specify type), Shower chair with back Home Equipment: Walker - 2 wheels, Shower seat, Wheelchair - manual  Prior Device Use: Indicate devices/aids used by the patient prior to current illness, exacerbation or injury? None of the above  Current Functional Level Cognition  Overall Cognitive Status: Within Functional Limits for tasks assessed Orientation Level: Oriented X4    Extremity Assessment (includes Sensation/Coordination)  Upper Extremity Assessment: Generalized weakness  Lower Extremity Assessment: Generalized weakness    ADLs       Mobility  Overal bed mobility: Needs Assistance Bed Mobility: Supine to Sit Supine to sit: Min assist, Mod assist General bed mobility comments: min A to come to sitting; mod assist to help LEs back onto bed; dizzy upon initial sitting upright    Transfers  Overall transfer level: Needs assistance Equipment used: Rolling walker (2 wheeled) Transfers: Sit to/from Stand, Lateral/Scoot Transfers Sit to Stand: +2 physical assistance, Max assist, From elevated surface  Lateral/Scoot Transfers: Mod assist General transfer comment: 2 attempts at sit to stand with 2 person assist and bed elevated; first attempt unsuccessful; Second attempt, Mr. Esty stood for less than 20 seconds; cues to shift weight forward to initiate; he indicated a fear of falling forward; Light mod assist with simulated lateral scoot transfers, scooting towards head of bed    Ambulation / Gait / Stairs / Wheelchair Mobility  Ambulation/Gait General Gait Details: Unable today    Posture / Balance Balance Overall balance assessment: Needs assistance Sitting balance-Leahy Scale: Fair    Special needs/care consideration BiPAP/CPAP no CPM no Continuous Drip IV no Dialysis yes        Days T/Th/Sa Life Vest no Oxygen no Special Bed no Trach Size no Wound Vac (area) no      Location n/a Skin abrasion to chest, MASD to scrotum, skin tear to L thigh Bowel  mgmt: incontinent, last BM 09/03/2018 Bladder mgmt: oliguria Diabetic mgmt: yes Behavioral consideration no Chemo/radiation no   Previous Home Environment (from acute therapy documentation) Living Arrangements: [name]/significant other Available Help at Discharge: Family, Available 24 hours/day Type of Home: House Home Layout: One level Home Access: Stairs to enter Entrance Stairs-Rails: Can reach both  Entrance Stairs-Number of Steps: 6 Bathroom Shower/Tub: Chiropodist: Standard Home Care Services: No  Discharge Living Setting Plans for Discharge Living Setting: Patient's home Type of Home at Discharge: House Discharge Home Layout: One level Discharge Home Access: Stairs to enter Entrance Stairs-Rails: Can reach both Entrance Stairs-Number of Steps: 6 Discharge Bathroom Shower/Tub: Tub/shower unit Discharge Bathroom Toilet: Standard Discharge Bathroom Accessibility: Yes How Accessible: Accessible via walker Does the patient have any problems obtaining your medications?: No  Social/Family/Support Systems Patient Roles: [name], Parent Anticipated Caregiver: wife Valinda  Anticipated Caregiver's Contact Information: [phone] Ability/Limitations of Caregiver: none, will also have help from 31 y/o daughter if needed Caregiver Availability: 24/7 Discharge Plan Discussed with Primary Caregiver: Yes Is Caregiver In Agreement with Plan?: Yes Does Caregiver/Family have Issues with Lodging/Transportation while Pt is in Rehab?: No  Goals/Additional Needs Patient/Family Goal for Rehab: PT/OT supervision to mod I Expected length of stay: 10-12 days Dietary Needs: renal diet, 1279m fluid restriction Equipment Needs: tbd Special Service Needs: HD T/Th/Sa Pt/Family Agrees to Admission and willing to participate: Yes Program Orientation Provided & Reviewed with Pt/Caregiver Including Roles  & Responsibilities: Yes   Possible need for SNF placement upon  discharge: not anticipated  Patient Condition: [facility] spoken with CM, and patient and [name]. I met with patient at the bedside and discussed with [name] via phone for inpatient rehabilitation assessment.  Patient will benefit from ongoing PT and OT, can actively participate in 3 hours of therapy a day 5 days of the week, and can make measurable gains during the admission.  Patient will also benefit from the coordinated team approach during an Inpatient Acute Rehabilitation admission.  The patient will receive intensive therapy as well as Rehabilitation physician, nursing, social worker, and care management interventions.  Due to safety, skin/wound care, disease management, medication administration, pain management and patient education the patient requires 24 hour a day rehabilitation nursing.  The patient is currently max +2 with mobility and basic ADLs.  Discharge setting and therapy post discharge at home with home health is anticipated.  Patient has agreed to participate in the Acute Inpatient Rehabilitation Program and will admit today.  Preadmission Screen Completed By:  CMichel Santee PT, DPT 09/04/2018 11:02 AM ______________________________________________________________________   Discussed status with Dr. SNaaman Plummeron 09/04/18  at 11:13 AM  and received approval for admission today.  Admission Coordinator:  CMichel Santee PT, DPT 11:13 AM /Sudie Grumbling05/19/20    Assessment/Plan: Diagnosis: debility 1. Does the need for close, 24 hr/day Medical supervision in concert with the patient's rehab needs make it unreasonable for this patient to be served in a less intensive setting? Yes 2. Co-Morbidities requiring supervision/potential complications: ESRD, HTN, CAD/CABG hx of AVR 3. Due to bladder management, bowel management, safety, skin/wound care, disease management, medication administration, pain management and patient education, does the  patient require 24 hr/day rehab nursing? Yes 4. Does the patient require coordinated care of a physician, rehab nurse, PT (1-2 hrs/day, 5 days/week) and OT (1-2 hrs/day, 5 days/week) to address physical and functional deficits in the context of the above medical diagnosis(es)? Yes Addressing deficits in the following areas: balance, endurance, locomotion, strength, transferring, bowel/bladder control, bathing, dressing, feeding, grooming, toileting and psychosocial support 5. Can the patient actively participate in an intensive therapy program of at least 3 hrs of therapy 5 days a week? Yes 6. The potential for patient to make measurable gains while on inpatient rehab is  excellent 7. Anticipated functional outcomes upon discharge from inpatients are: modified independent and supervision PT, modified independent and supervision OT, n/a SLP 8. Estimated rehab length of stay to reach the above functional goals is: 10-12 days 9. Anticipated D/C setting: Home 10. Anticipated post D/C treatments: [place] therapy 11. Overall Rehab/Functional Prognosis: excellent  MD Signature: Meredith Staggers, MD, [facility] 09/04/2018

## 2018-09-04 NOTE — Progress Notes (Signed)
ANTICOAGULATION CONSULT NOTE  Pharmacy Consult for warfarin Indication: Mechanical valve  No Known Allergies  Patient Measurements:    Vital Signs: Temp: 98.1 F (36.7 C) (05/19 1559) Temp Source: Oral (05/19 1559) BP: 94/62 (05/19 1559) Pulse Rate: 95 (05/19 1559)  Labs: Recent Labs    09/02/18 0323 09/03/18 0318 09/04/18 0325 09/04/18 0715  HGB 7.0* 7.1* 7.1*  --   HCT 23.0* 23.6* 23.7*  --   PLT 319 337 330  --   LABPROT 22.4* 26.8* 30.4*  --   INR 2.0* 2.5* 3.0*  --   HEPARINUNFRC 0.34  --   --   --   CREATININE  --   --   --  7.38*    Estimated Creatinine Clearance: 16.3 mL/min (A) (by C-G formula based on SCr of 7.38 mg/dL (H)).  Assessment: 61 YOM on warfarin PTA for mech aortic valve with subtherapeutic INR started on heparin with no bolus and tight goals d/t bleed risk with current hematuria. Patient now s/p embolization on 5/13. Last dose warfarin PTA was on 5/2 and patient is s/p 8 mg Vitamin K. INR goal has been decreased to 2-2.5  Patient transferred to [facility]. Plan prior to transfer:  The patient's INR today is slightly SUPRAtherapeutic or the specified goal range (INR 3 << 2.5, goal of 2-2.5). Hgb 7.1, plts wnl. Some penile bleeding noted - but not overt bleeding at this time.Planning d/c to CIR.   Goal of Therapy:  INR 2-2.5 Monitor platelets by anticoagulation protocol: Yes   Plan:  - Hold warfarin dose today - Will continue to monitor for any signs/symptoms of bleeding and will follow up with PT/INR in the a.m.   Thank you for allowing pharmacy to be a part of this patient's care.  Antonietta Jewel, PharmD, [place] Clinical Pharmacist  Pager: [phone] Phone: [phone] **Pharmacist phone directory can now be found on [email or web address] ([email or web address]).  Listed under [place].

## 2018-09-04 NOTE — Progress Notes (Signed)
Meredith Staggers, MD  Physician  Physical Medicine and Rehabilitation  PMR Pre-admission  Signed  Date of Service:  09/04/2018 11:02 AM       Related encounter: ED to Hosp-Admission (Current) from 08/19/2018 in [facility]      Signed         Show:Clear all _0 Manual_1 Template_2 Copied  Added by: _3 Meredith Staggers, MD_4 Michel Santee, PT  _5 Hover for details PMR Admission Coordinator Pre-Admission Assessment  Patient: [name] [name] is an 53 y.o., male MRN: [id number] DOB: 1965-11-22 Height: _6  (185.4 cm) Weight: 130 kg  Insurance Information HMO:     PPO:      PCP:      IPA:      80/20:      OTHER:  PRIMARY: Medicare A and B      Policy#: [id number]      Subscriber: patient CM Name:       Phone#:      Fax#:  Pre-Cert#: verified online      Employer:  Benefits:  Phone #:      Name:  Eff. Date: 08/17/2002     Deduct: $1408      Out of Pocket Max: n/a      Life Max: n/a CIR: 100%      SNF: 20 full days Outpatient: 80%     Co-Pay: 20% Home Health: 100%      Co-Pay:  DME: 80%     Co-Pay: 20% Providers: pts choice SECONDARY: BCBS Supplement      Policy#: [id number]      Subscriber: patient Benefits:  Phone #: [phone]   Medicaid Application Date:       Case Manager:  Disability Application Date:       Case Worker:   The Data Collection Information Summary for patients in Inpatient Rehabilitation Facilities with attached Privacy Act [place] Records was provided and verbally reviewed with: Patient  Emergency Contact Information         Contact Information    Name Relation Home Work Mobile   [name] [name] Spouse [phone]        Current Medical History  Patient Admitting Diagnosis: debility with perinephric hematoma  History of Present Illness: [name] a 53 y.o.malewith history of ESRD- HD TTS, HTN, CAD/AS s/p CABG with mechanical aortic valve and chronic coumadin, recent admission  4/15-4/21 for gross hematuria and abdominal pain due to supratherapeutic INR and ruptured hemorrhagic cyst. He was readmitted on 08/19/18 with tachycardia, fall a couple of days PTA with LUQ and flank pain radiating to the back and hematuria. CT abdomen at OSH showed increase in size of perinephric hematoma. INR noted to be supratherapeutic at 5.5 with Hgb 7.6. INR reversed and he was transfused with one unit PRBC. Urology recommended supportive care with bed rest and serial H/H till hgb stable--if refractory would require embolization. He continued to have ongoing blood loss with pain requiring 7 units of PRBC. Radiology consulted for assistance and patient underwent left renal embolization by Dr. Pascal Lux on 5/13. Coumadin resumed and INR therapeutic. He was weaned off PCA and PT initiated. He has been unable to tolerate standing due to BLE weakness, dizziness as well as severe pain in abdomen and back. CIR recommended due to functional decline.   Patient[facility] from [facility] has been reviewed by the rehabilitation admission coordinator and physician.  Past Medical History      Past Medical History:  Diagnosis Date  Aneurysm ([place])     Right arm fistula 3 aneurysms 2011,   plans to have a new procedure   Angina    Chest discomfort    CHF (congestive heart failure) ([place])    Coronary artery disease    Coronary artery disease involving native coronary artery of native heart without angina pectoris    Dialysis patient [facility])    Dysrhythmia    ESRD (end stage renal disease) ([place])    on hemodialysis T_T_S   Hypertension    Leg pain    Mitral regurgitation    Morbid obesity (H[facility])    Orthostatic hypotension    Overweight(278.02)    Renal insufficiency    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 2008    Family History   family history includes Heart disease in his father and mother; Hypertension in his father and mother.  Prior Rehab/Hospitalizations Has the patient had prior rehab or hospitalizations prior to admission? Yes  Has the patient had major surgery during 100 days prior to admission? Yes             Current Medications  Current Facility-Administered Medications:    0.9 %  sodium chloride infusion (Manually program via Guardrails IV Fluids), , Intravenous, Once, Alfonse Spruce, Jamie M, MD   0.9 %  sodium chloride infusion, 100 mL, Intravenous, PRN, Edrick Oh, MD   0.9 %  sodium chloride infusion, 100 mL, Intravenous, PRN, Edrick Oh, MD   acetaminophen (TYLENOL) tablet 650 mg, 650 mg, Oral, Q6H PRN, 650 mg at 08/21/18 1412 **OR** acetaminophen (TYLENOL) suppository 650 mg, 650 mg, Rectal, Q6H PRN, [name], MD   acetaminophen (TYLENOL) tablet 650 mg, 650 mg, Oral, Q6H, Dorrell, [name], MD, 650 mg at 09/03/18 2200   alteplase (CATHFLO ACTIVASE) injection 2 mg, 2 mg, Intracatheter, Once PRN, Edrick Oh, MD   amiodarone (PACERONE) tablet 200 mg, 200 mg, Oral, Daily, Guadlupe Spanish B, MD, 200 mg at 09/03/18 0810   atorvastatin (LIPITOR) tablet 80 mg, 80 mg, Oral, q1800, [name], MD, 80 mg at 09/03/18 1641   Chlorhexidine Gluconate Cloth 2 % PADS 6 each, 6 each, Topical, Q0600, Justin Mend, MD, 6 each at 09/04/18 0513   Darbepoetin Alfa (ARANESP) injection 100 mcg, 100 mcg, Intravenous, Q Tue-HD, Ernest Haber, PA-C, 100 mcg at 09/04/18 0936   feeding supplement (NEPRO CARB STEADY) liquid 237 mL, 237 mL, Oral, BID BM, Oda Kilts, MD, 237 mL at 08/29/18 1417   feeding supplement (PRO-STAT SUGAR FREE 64) liquid 30 mL, 30 mL, Oral, TID, Oda Kilts, MD, 30 mL at 09/03/18 2200   heparin injection 1,000 Units, 1,000 Units,  Dialysis, PRN, Edrick Oh, MD   HYDROmorphone (DILAUDID) injection 1 mg, 1 mg, Intravenous, Q4H PRN, Dorrell, [name], MD, 1 mg at 09/03/18 1649   iohexol (OMNIPAQUE) 300 MG/ML solution 15 mL, 15 mL, Intravenous, Once PRN, Sandi Mariscal, MD   lanthanum [facility]) chewable tablet 1,000 mg, 1,000 mg, Oral, TID PC, [name], MD, 1,000 mg at 08/27/18 1703   lanthanum (FOSRENOL) chewable tablet 500 mg, 500 mg, Oral, PRN, Oda Kilts, MD   lidocaine (PF) (XYLOCAINE) 1 % injection 5 mL, 5 mL, Intradermal, PRN,  Edrick Oh, MD   lidocaine-prilocaine (EMLA) cream 1 application, 1 application, Topical, PRN, Edrick Oh, MD   midodrine (PROAMATINE) tablet 15 mg, 15 mg, Oral, TID WC, [name], MD, 15 mg at 09/04/18 3818   multivitamin (RENA-VIT) tablet 1 tablet, 1 tablet, Oral, QHS, Valentina Gu, NP, 1 tablet at 09/03/18 2200   pentafluoroprop-tetrafluoroeth (GEBAUERS) aerosol 1 application, 1 application, Topical, PRN, Edrick Oh, MD   sodium chloride flush (NS) 0.9 % injection 10-40 mL, 10-40 mL, Intracatheter, Q12H, Oda Kilts, MD, 10 mL at 09/03/18 1106   Warfarin - Pharmacist Dosing Inpatient, , Does not apply, q1800, Joselyn Glassman A, RPH  Patients Current Diet:     Diet Order                  Diet - low sodium heart healthy         Diet renal with fluid restriction Fluid restriction: 1200 mL Fluid; Room service appropriate? Yes; Fluid consistency: Thin  Diet effective now               Precautions / Restrictions Precautions Precautions: Fall Restrictions Weight Bearing Restrictions: No   Has the patient had 2 or more falls or a fall with injury in the past year? Yes  Prior Activity Level Community (5-7x/wk): went out daily, occasionally driving, HD T/Th/Sa  Prior Functional Level Self Care: Did the patient need help bathing, dressing, using the toilet or eating? Independent  Indoor Mobility: Did the  patient need assistance with walking from room to room (with or without device)? Independent  Stairs: Did the patient need assistance with internal or external stairs (with or without device)? Independent  Functional Cognition: Did the patient need help planning regular tasks such as shopping or remembering to take medications? [place] / Equipment Home Assistive Devices/Equipment: Bedside commode/3-in-1, Cane (specify quad or straight), Grab bars in shower, Walker (specify type), Shower chair with back Home Equipment: Walker - 2 wheels, Shower seat, Wheelchair - manual  Prior Device Use: Indicate devices/aids used by the patient prior to current illness, exacerbation or injury? None of the above  Current Functional Level Cognition  Overall Cognitive Status: Within Functional Limits for tasks assessed Orientation Level: Oriented X4    Extremity Assessment (includes Sensation/Coordination)  Upper Extremity Assessment: Generalized weakness  Lower Extremity Assessment: Generalized weakness    ADLs       Mobility  Overal bed mobility: Needs Assistance Bed Mobility: Supine to Sit Supine to sit: Min assist, Mod assist General bed mobility comments: min A to come to sitting; mod assist to help LEs back onto bed; dizzy upon initial sitting upright    Transfers  Overall transfer level: Needs assistance Equipment used: Rolling walker (2 wheeled) Transfers: Sit to/from Stand, Lateral/Scoot Transfers Sit to Stand: +2 physical assistance, Max assist, From elevated surface  Lateral/Scoot Transfers: Mod assist General transfer comment: 2 attempts at sit to stand with 2 person assist and bed elevated; first attempt unsuccessful; Second attempt, Mr. Magallanes stood for less than 20 seconds; cues to shift weight forward to initiate; he indicated a fear of falling forward; Light mod assist with simulated lateral scoot transfers, scooting towards head of bed      Ambulation / Gait / Stairs / Wheelchair Mobility  Ambulation/Gait General Gait Details: Unable today    Posture / Balance Balance Overall balance assessment: Needs assistance Sitting balance-Leahy Scale: Fair    Special needs/care consideration BiPAP/CPAP no CPM no Continuous Drip IV  no Dialysis yes        Days T/Th/Sa Life Vest no Oxygen no Special Bed no Trach Size no Wound Vac (area) no      Location n/a Skin abrasion to chest, MASD to scrotum, skin tear to L thigh Bowel mgmt: incontinent, last BM 09/03/2018 Bladder mgmt: oliguria Diabetic mgmt: yes Behavioral consideration no Chemo/radiation no   Previous Home Environment (from acute therapy documentation) Living Arrangements: Spouse/significant other Available Help at Discharge: Family, Available 24 hours/day Type of Home: House Home Layout: One level Home Access: Stairs to enter Entrance Stairs-Rails: Can reach both Entrance Stairs-Number of Steps: 6 Bathroom Shower/Tub: Chiropodist: Standard Home Care Services: No  Discharge Living Setting Plans for Discharge Living Setting: Patient's home Type of Home at Discharge: House Discharge Home Layout: One level Discharge Home Access: Stairs to enter Entrance Stairs-Rails: Can reach both Entrance Stairs-Number of Steps: 6 Discharge Bathroom Shower/Tub: Tub/shower unit Discharge Bathroom Toilet: Standard Discharge Bathroom Accessibility: Yes How Accessible: Accessible via walker Does the patient have any problems obtaining your medications?: No  Social/Family/Support Systems Patient Roles: Spouse, Parent Anticipated Caregiver: wife Valinda  Anticipated Caregiver's Contact Information: [phone] Ability/Limitations of Caregiver: none, will also have help from 51 y/o daughter if needed Caregiver Availability: 24/7 Discharge Plan Discussed with Primary Caregiver: Yes Is Caregiver In Agreement with Plan?: Yes Does Caregiver/Family have  Issues with Lodging/Transportation while Pt is in Rehab?: No  Goals/Additional Needs Patient/Family Goal for Rehab: PT/OT supervision to mod I Expected length of stay: 10-12 days Dietary Needs: renal diet, 1216m fluid restriction Equipment Needs: tbd Special Service Needs: HD T/Th/Sa Pt/Family Agrees to Admission and willing to participate: Yes Program Orientation Provided & Reviewed with Pt/Caregiver Including Roles  & Responsibilities: Yes   Possible need for SNF placement upon discharge: not anticipated  Patient Condition: [facility] spoken [facility] CM, and patient and spouse. I met with patient at the bedside and discussed with spouse via phone for inpatient rehabilitation assessment.  Patient will benefit from ongoing PT and OT, can actively participate in 3 hours of therapy a day 5 days of the week, and can make measurable gains during the admission.  Patient will also benefit from the coordinated team approach during an Inpatient Acute Rehabilitation admission.  The patient will receive intensive therapy as well as Rehabilitation physician, nursing, social worker, and care management interventions.  Due to safety, skin/wound care, disease management, medication administration, pain management and patient education the patient requires 24 hour a day rehabilitation nursing.  The patient is currently max +2 with mobility and basic ADLs.  Discharge setting and therapy post discharge at home with home health is anticipated.  Patient has agreed to participate in the Acute Inpatient Rehabilitation Program and will admit today.  Preadmission Screen Completed By:  CMichel Santee PT, DPT 09/04/2018 11:02 AM ______________________________________________________________________   Discussed status with Dr. SNaaman Plummeron 09/04/18  at 11:13 AM  and received approval for admission today.  Admission Coordinator:  CMichel Santee PT, DPT 11:13 AM /Sudie Grumbling 09/04/18    Assessment/Plan: Diagnosis: debility 1. Does the need for close, 24 hr/day Medical supervision in concert with the patient's rehab needs make it unreasonable for this patient to be served in a less intensive setting? Yes 2. Co-Morbidities requiring supervision/potential complications: ESRD, HTN, CAD/CABG hx of AVR 3. Due to bladder management, bowel management, safety, skin/wound care, disease management, medication administration, pain management and patient education, does the patient  require 24 hr/day rehab nursing? Yes 4. Does the patient require coordinated care of a physician, rehab nurse, PT (1-2 hrs/day, 5 days/week) and OT (1-2 hrs/day, 5 days/week) to address physical and functional deficits in the context of the above medical diagnosis(es)? Yes Addressing deficits in the following areas: balance, endurance, locomotion, strength, transferring, bowel/bladder control, bathing, dressing, feeding, grooming, toileting and psychosocial support 5. Can the patient actively participate in an intensive therapy program of at least 3 hrs of therapy 5 days a week? Yes 6. The potential for patient to make measurable gains while on inpatient rehab is excellent 7. Anticipated functional outcomes upon discharge from inpatients are: modified independent and supervision PT, modified independent and supervision OT, n/a SLP 8. Estimated rehab length of stay to reach the above functional goals is: 10-12 days 9. Anticipated D/C setting: Home 10. Anticipated post D/C treatments: [place] therapy 11. Overall Rehab/Functional Prognosis: excellent  MD Signature: Meredith Staggers, MD, [place] Physical Medicine & Rehabilitation 09/04/2018         Revision History

## 2018-09-04 NOTE — TOC Transition Note (Signed)
Transition of Care [facility]) - CM/SW Discharge Note   Patient Details  Name: JEHAD BISONO MRN: [id number] Date of Birth: [birth year]  Transition of Care [facility]) CM/SW Contact:  Bartholomew Crews, RN Phone Number: 09/04/2018, 3:49 PM   Clinical Narrative:    Patient to transition to inpatient rehab today.    Final next level of care: IP Rehab Facility Barriers to Discharge: No Barriers Identified   Patient Goals and CMS Choice   CMS Medicare.gov Compare Post Acute Care list provided to:: Patient Choice offered to / list presented to : Patient  Discharge Placement               Inpatient rehab        Discharge Plan and Services                DME Arranged: N/A DME Agency: NA       HH Arranged: NA HH Agency: NA        Social Determinants of Health (SDOH) Interventions     Readmission Risk Interventions No flowsheet data found.

## 2018-09-04 NOTE — H&P (Signed)
Physical Medicine and Rehabilitation Admission H&P    Chief Complaint  Patient presents with  . debility     HPI:  [name] [name] is a 53 year old male with history of ESRD due to polycystic kidney disease-- HD TTS, HTN, CAD/AS s/p CABG with mechanical AVR on chronic coumadin recent admission 4/15-4/21 for supratherapeutic INR and ruptured hemorrhagic renal cyst.  He was readmitted on 08/19/2018 with tachycardia, fall a few days prior to admission with difficulty walking and inability to get to HD, LUQ and flank pain radiating to back with hematuria.  CT abdomen at OSH showed increase in size of perinephric hematoma and INR noted to be supratherapeutic at 5.5.  INR was reversed and he was transfused with 1 units packed red blood cells for hemoglobin of 7.6.  Urology consulted and recommended supportive care with bedrest and serial H&H till hemoglobin stable.    He continued to have ongoing blood loss with pain and required 5 total units of PRBC. Dr. Alinda Money recommended consulting radiology consulted for assistance and patient underwent left renal embolization by Dr. Pascal Lux on  05/13.  Coumadin resumed and  INR trending back upwards.  He has been weaned off PCA and physical therapy initiated however patient has been unable to tolerate standing due to BLE weakness or sitting up in a chair due to severe back/flank pain.  CIR was recommended due to functional decline   Review of Systems  Constitutional: Negative for chills and fever.  HENT: Negative for hearing loss and tinnitus.   Eyes: Negative for blurred vision and double vision.  Respiratory: Negative for cough and shortness of breath.   Cardiovascular: Negative for chest pain and palpitations.  Gastrointestinal: Negative for abdominal pain, constipation, heartburn and nausea.  Genitourinary: Positive for hematuria (decreasing in volume).  Musculoskeletal: Positive for back pain and myalgias.  Skin: Negative for itching and rash.   Neurological: Positive for weakness. Negative for dizziness.  Psychiatric/Behavioral: The patient has insomnia.      Past Medical History:  Diagnosis Date  . Aneurysm ([place])     Right arm fistula 3 aneurysms 2011,   plans to have a new procedure  . Angina   . Chest discomfort   . CHF (congestive heart failure) ([place])   . Coronary artery disease   . Coronary artery disease involving native coronary artery of native heart without angina pectoris   . Dialysis patient ([place])   . Dysrhythmia   . ESRD (end stage renal disease) ([place])    on hemodialysis T_T_S  . Hypertension   . Leg pain   . Mitral regurgitation   . Morbid obesity ([place])   . Orthostatic hypotension   . Overweight(278.02)   . Renal insufficiency   . 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 Severe aortic insufficiency   . Sinus tachycardia   . Syncope     positional after dialysis... 2008    Past Surgical History:  Procedure Laterality Date  . A/V FISTULAGRAM Left 06/30/2016   Procedure: A/V Fistulagram;  Surgeon: Algernon Huxley, MD;  Location: [facility];  Service: Cardiovascular;  Laterality: Left;  . AORTIC VALVE REPLACEMENT N/A 09/01/2017   Procedure: AORTIC VALVE REPLACEMENT (AVR);  Surgeon: [name], MD;  Location: [facility];  Service: Open Heart Surgery;  Laterality: N/A;  . ARTERIOVENOUS  GRAFT PLACEMENT    . AV FISTULA PLACEMENT    . AV FISTULA PLACEMENT Left 11/18/2015   Procedure: ARTERIOVENOUS (AV) FISTULA CREATION ( RADIOCEPHALIC );  Surgeon: Algernon Huxley, MD;  Location: [facility];  Service: Vascular;  Laterality: Left;  . AV FISTULA PLACEMENT Left 03/23/2016   Procedure: ARTERIOVENOUS (AV) FISTULA CREATION ( REVISION );  Surgeon: Algernon Huxley, MD;  Location: [facility];  Service: Vascular;  Laterality: Left;  . [place] REMOVAL Left 03/23/2016   Procedure: REMOVAL OF  ARTERIOVENOUS GORETEX GRAFT ([place]);  Surgeon: Algernon Huxley, MD;  Location: [facility];  Service: Vascular;  Laterality: Left;  . CENTRAL LINE INSERTION Right 09/01/2017   Procedure: FLOROSCOPY GUIDED PLACEMENT OF RIGHT FEMORAL CENTRAL LINE TIMES TWO  AND PLACEMENT OF SWAN GANZ CATHETER;  Surgeon: [name], MD;  Location: [place] [facility];  Service: Open Heart Surgery;  Laterality: Right;  . CORONARY ARTERY BYPASS GRAFT N/A 09/01/2017   Procedure: CORONARY ARTERY BYPASS GRAFTING (CABG) X 1, USING RIGHT GREATER SAPHENOUS VEIN HARVESTED ENDOSCOPICALLY;  Surgeon: [name], MD;  Location: [facility] [facility];  Service: Open Heart Surgery;  Laterality: N/A;  . DIALYSIS/PERMA CATHETER REMOVAL N/A 08/01/2016   Procedure: Dialysis/Perma Catheter Removal;  Surgeon: Algernon Huxley, MD;  Location: [facility];  Service: Cardiovascular;  Laterality: N/A;  . HERNIA REPAIR    . INSERTION OF DIALYSIS CATHETER  03/01/2011   Procedure: INSERTION OF DIALYSIS CATHETER;  Surgeon: Elam Dutch, MD;  Location: [facility];  Service: Vascular;  Laterality: Left;  Exchange of Dialysis Catheter to 27cm 15Fr. Arrow Catheter  . INSERTION OF DIALYSIS CATHETER  09/01/2017   Procedure: PLACEMENT OF TRIALYSIS SHORT TERM DIALYSIS CATHETER;  Surgeon: [name], MD;  Location: [facility];  Service: Open Heart Surgery;;  . IR EMBO ART  VEN HEMORR LYMPH EXTRAV  INC GUIDE ROADMAPPING  08/29/2018  . IR FLUORO GUIDE CV LINE RIGHT  08/27/2018  . IR RENAL SUPRASEL UNI S&I MOD SED  08/29/2018  . IR US GUIDE VASC ACCESS RIGHT  08/27/2018  . IR US GUIDE VASC ACCESS RIGHT  08/29/2018  . MULTIPLE EXTRACTIONS WITH ALVEOLOPLASTY N/A 08/07/2017   Procedure: Extraction of tooth #2, 7,8,13,14,15,17, and 31 with alveoloplasty and gross debridement of remaining teeth;  Surgeon: [name], DDS;  Location: [facility];  Service: Oral Surgery;  Laterality: N/A;  . PERIPHERAL VASCULAR CATHETERIZATION N/A 09/01/2014   Procedure: A/V Shuntogram/Fistulagram;  Surgeon:  Algernon Huxley, MD;  Location: [facility];  Service: Cardiovascular;  Laterality: N/A;  . PERIPHERAL VASCULAR CATHETERIZATION Right 09/01/2014   Procedure: Thrombectomy;  Surgeon: Algernon Huxley, MD;  Location: [facility];  Service: Cardiovascular;  Laterality: Right;  . PERIPHERAL VASCULAR CATHETERIZATION Right 09/01/2014   Procedure: A/V Shunt Intervention;  Surgeon: Algernon Huxley, MD;  Location: [facility];  Service: Cardiovascular;  Laterality: Right;  . PERIPHERAL VASCULAR CATHETERIZATION N/A 09/17/2014   Procedure: A/V Shuntogram/Fistulagram;  Surgeon: Algernon Huxley, MD;  Location: [facility];  Service: Cardiovascular;  Laterality: N/A;  . PERIPHERAL VASCULAR CATHETERIZATION Right 10/17/2014   Procedure: Thrombectomy;  Surgeon: Katha Cabal, MD;  Location: [facility];  Service: Cardiovascular;  Laterality: Right;  . PERIPHERAL VASCULAR CATHETERIZATION N/A 10/17/2014   Procedure: A/V Shuntogram/Fistulagram;  Surgeon: Katha Cabal, MD;  Location: [facility];  Service: Cardiovascular;  Laterality: N/A;  . PERIPHERAL VASCULAR CATHETERIZATION N/A 10/17/2014   Procedure: A/V Shunt  Intervention;  Surgeon: Katha Cabal, MD;  Location: [facility];  Service: Cardiovascular;  Laterality: N/A;  . PERIPHERAL VASCULAR CATHETERIZATION Right 11/04/2014   Procedure: Thrombectomy;  Surgeon: Katha Cabal, MD;  Location: [facility];  Service: Cardiovascular;  Laterality: Right;  . PERIPHERAL VASCULAR CATHETERIZATION Left 11/04/2014   Procedure: Visceral Venography;  Surgeon: Katha Cabal, MD;  Location: [facility];  Service: Cardiovascular;  Laterality: Left;  . PERIPHERAL VASCULAR CATHETERIZATION Right 11/27/2014   Procedure: A/V Shuntogram/Fistulagram;  Surgeon: Algernon Huxley, MD;  Location: [facility];  Service: Cardiovascular;  Laterality: Right;  . PERIPHERAL VASCULAR CATHETERIZATION Right 11/27/2014    Procedure: A/V Shunt Intervention;  Surgeon: Algernon Huxley, MD;  Location: [facility];  Service: Cardiovascular;  Laterality: Right;  . PERIPHERAL VASCULAR CATHETERIZATION Right 12/31/2014   Procedure: Thrombectomy;  Surgeon: Algernon Huxley, MD;  Location: [facility];  Service: Cardiovascular;  Laterality: Right;  . PERIPHERAL VASCULAR CATHETERIZATION Right 01/12/2015   Procedure: A/V Shuntogram/Fistulagram;  Surgeon: Algernon Huxley, MD;  Location: [facility];  Service: Cardiovascular;  Laterality: Right;  . PERIPHERAL VASCULAR CATHETERIZATION N/A 01/12/2015   Procedure: A/V Shunt Intervention;  Surgeon: Algernon Huxley, MD;  Location: [facility];  Service: Cardiovascular;  Laterality: N/A;  . PERIPHERAL VASCULAR CATHETERIZATION Right 01/28/2015   Procedure: Thrombectomy;  Surgeon: Algernon Huxley, MD;  Location: [facility];  Service: Cardiovascular;  Laterality: Right;  . PERIPHERAL VASCULAR CATHETERIZATION N/A 06/08/2015   Procedure: A/V Shuntogram/Fistulagram;  Surgeon: Algernon Huxley, MD;  Location: [facility];  Service: Cardiovascular;  Laterality: N/A;  . PERIPHERAL VASCULAR CATHETERIZATION N/A 06/08/2015   Procedure: A/V Shunt Intervention;  Surgeon: Algernon Huxley, MD;  Location: [facility];  Service: Cardiovascular;  Laterality: N/A;  . PERIPHERAL VASCULAR CATHETERIZATION Right 07/06/2015   Procedure: A/V Shuntogram/Fistulagram;  Surgeon: Algernon Huxley, MD;  Location: [facility];  Service: Cardiovascular;  Laterality: Right;  . PERIPHERAL VASCULAR CATHETERIZATION N/A 07/06/2015   Procedure: A/V Shunt Intervention;  Surgeon: Algernon Huxley, MD;  Location: [facility];  Service: Cardiovascular;  Laterality: N/A;  . PERIPHERAL VASCULAR CATHETERIZATION N/A 08/04/2015   Procedure: graft declot;  Surgeon: Katha Cabal, MD;  Location: [facility];  Service: Cardiovascular;  Laterality: N/A;  . PERIPHERAL VASCULAR CATHETERIZATION  Right 08/25/2015   Procedure: A/V Shuntogram/Fistulagram;  Surgeon: Katha Cabal, MD;  Location: [facility];  Service: Cardiovascular;  Laterality: Right;  . PERIPHERAL VASCULAR CATHETERIZATION N/A 11/04/2015   Procedure: Dialysis/Perma Catheter Insertion;  Surgeon: Algernon Huxley, MD;  Location: [facility];  Service: Cardiovascular;  Laterality: N/A;  . PERIPHERAL VASCULAR CATHETERIZATION Left 12/14/2015   Procedure: A/V Shuntogram/Fistulagram;  Surgeon: Algernon Huxley, MD;  Location: [facility];  Service: Cardiovascular;  Laterality: Left;  . PERIPHERAL VASCULAR CATHETERIZATION N/A 12/14/2015   Procedure: A/V Shunt Intervention;  Surgeon: Algernon Huxley, MD;  Location: [facility];  Service: Cardiovascular;  Laterality: N/A;  . PERIPHERAL VASCULAR CATHETERIZATION N/A 12/17/2015   Procedure: Dialysis/Perma Catheter Insertion;  Surgeon: Algernon Huxley, MD;  Location: [facility];  Service: Cardiovascular;  Laterality: N/A;  . PERIPHERAL VASCULAR CATHETERIZATION Left 12/22/2015   Procedure: Dialysis/Perma Catheter Insertion;  Surgeon: Katha Cabal, MD;  Location: [facility];  Service: Cardiovascular;  Laterality: Left;  . PERIPHERAL VASCULAR  CATHETERIZATION Left 02/29/2016   Procedure: A/V Shuntogram/Fistulagram;  Surgeon: Algernon Huxley, MD;  Location: [facility];  Service: Cardiovascular;  Laterality: Left;  . PERIPHERAL VASCULAR CATHETERIZATION N/A 02/29/2016   Procedure: A/V Shunt Intervention;  Surgeon: Algernon Huxley, MD;  Location: [facility];  Service: Cardiovascular;  Laterality: N/A;  . POCKET REVISION/RELOCATION N/A 01/01/2018   Procedure: POCKET REVISION/RELOCATION;  Surgeon: Evans Lance, MD;  Location: [facility];  Service: Cardiovascular;  Laterality: N/A;  . right arm graft     for dyalisis  . RIGHT/LEFT HEART CATH AND CORONARY ANGIOGRAPHY N/A 08/02/2017   Procedure: RIGHT/LEFT HEART CATH AND CORONARY  ANGIOGRAPHY;  Surgeon: Larey Dresser, MD;  Location: [facility];  Service: Cardiovascular;  Laterality: N/A;  . SUBQ ICD IMPLANT N/A 11/28/2017   Procedure: SUBQ ICD IMPLANT;  Surgeon: Evans Lance, MD;  Location: [facility];  Service: Cardiovascular;  Laterality: N/A;  . SUBQ ICD REVISION N/A 02/12/2018   Procedure: SUBQ ICD REVISION(REMOVAL);  Surgeon: Evans Lance, MD;  Location: [facility];  Service: Cardiovascular;  Laterality: N/A;  . TEE WITHOUT CARDIOVERSION N/A 07/31/2017   Procedure: TRANSESOPHAGEAL ECHOCARDIOGRAM (TEE);  Surgeon: Josue Hector, MD;  Location: [facility];  Service: Cardiovascular;  Laterality: N/A;  . TEE WITHOUT CARDIOVERSION N/A 09/01/2017   Procedure: TRANSESOPHAGEAL ECHOCARDIOGRAM (TEE);  Surgeon: [name], MD;  Location: [facility];  Service: Open Heart Surgery;  Laterality: N/A;  . THROMBECTOMY    . THROMBECTOMY W/ EMBOLECTOMY  03/01/2011   Procedure: THROMBECTOMY ARTERIOVENOUS GORE-TEX GRAFT;  Surgeon: Elam Dutch, MD;  Location: [place];  Service: Vascular;  Laterality: Right;  Attempted Thrombectomy of Old  Right Upper Arm Arteriovenous gortex Graft. Insertion of new Arteriovenous Graft using 52mm x 50cm Gortex Stretch graft.   . THROMBECTOMY W/ EMBOLECTOMY  07/11/2011   Procedure: THROMBECTOMY ARTERIOVENOUS GORE-TEX GRAFT;  Surgeon: Rosetta Posner, MD;  Location: [facility];  Service: Vascular;  Laterality: Right;  . UMBILICAL HERNIA REPAIR    . VENOGRAM N/A 08/23/2011   Procedure: VENOGRAM;  Surgeon: [name], MD;  Location: [facility];  Service: Cardiovascular;  Laterality: N/A;    Family History  Problem Relation Age of Onset  . Hypertension Mother   . Heart disease Mother   . Hypertension Father   . Heart disease Father     Social History:  Married. Used to work in a factory--disabled due to ESRD.  Independent without AD. He reports that he quit smoking about 24 years ago. His smoking use included cigarettes. His  smokeless tobacco use includes snuff--one pack a week. He reports that he does not drink alcohol or use drugs.    Allergies: No Known Allergies    Medications Prior to Admission  Medication Sig Dispense Refill  . amiodarone (PACERONE) 200 MG tablet Take 1 tablet (200 mg total) by mouth daily. (Patient taking differently: Take 200 mg by mouth at bedtime. ) 90 tablet 3  . atorvastatin (LIPITOR) 80 MG tablet Take 1 tablet (80 mg total) by mouth daily at 6 PM. (Patient taking differently: Take 80 mg by mouth daily. ) 90 tablet 3  . Cinacalcet HCl (SENSIPAR PO) Take 1 tablet by mouth daily.    [name] lanthanum (FOSRENOL) 500 MG chewable tablet Chew 500-1,000 mg by mouth See admin instructions. Take 2 tablets (1000 mg) by mouth three times daily with meals, take 1 tablet (500 mg) with snacks    .  midodrine (PROAMATINE) 5 MG tablet Take 3 tablets (15 mg total) by mouth 3 (three) times daily with meals. (Patient taking differently: Take 5 mg by mouth daily. ) 810 tablet 3  . warfarin (COUMADIN) 5 MG tablet Take 1 tablet daily except 1/2 tablet on Wednesdays and Sundays or as directed by coumadin clinic (Patient taking differently: Take 2.5-5 mg by mouth See admin instructions. Take 1/2 tablet (2.5 mg) by mouth on Sunday and Tuesday nights, take 1 tablet (5 mg) on Monday, Wednesday, Thursday, Friday, Saturday nights or as directed by coumadin clinic) 90 tablet 1    Drug Regimen Review  Drug regimen was reviewed and remains appropriate with no significant issues identified  Home: Home Living Family/patient expects to be discharged to:: Private residence Living Arrangements: Spouse/significant other Available Help at Discharge: Family, Available 24 hours/day Type of Home: House Home Access: Stairs to enter [employer] of Steps: 6 Entrance Stairs-Rails: Can reach both Home Layout: One level Bathroom Shower/Tub: Chiropodist: [place]: Environmental consultant - 2 wheels,  Shower seat, Wheelchair - manual   Functional History: Prior Function Level of Independence: Independent Comments: does report hx of falls over last 6 months  Functional Status:  Mobility: Bed Mobility Overal bed mobility: Needs Assistance Bed Mobility: Supine to Sit Supine to sit: Min assist, Mod assist General bed mobility comments: min A to come to sitting; mod assist to help LEs back onto bed; dizzy upon initial sitting upright Transfers Overall transfer level: Needs assistance Equipment used: Rolling walker (2 wheeled) Transfers: Sit to/from Stand, Lateral/Scoot Transfers Sit to Stand: +2 physical assistance, Max assist, From elevated surface  Lateral/Scoot Transfers: Mod assist General transfer comment: 2 attempts at sit to stand with 2 person assist and bed elevated; first attempt unsuccessful; Second attempt, Mr. Alcocer stood for less than 20 seconds; cues to shift weight forward to initiate; he indicated a fear of falling forward; Light mod assist with simulated lateral scoot transfers, scooting towards head of bed Ambulation/Gait General Gait Details: Unable today    ADL:    Cognition: Cognition Overall Cognitive Status: Within Functional Limits for tasks assessed Orientation Level: Oriented X4 Cognition Arousal/Alertness: Awake/alert Behavior During Therapy: WFL for tasks assessed/performed Overall Cognitive Status: Within Functional Limits for tasks assessed   Blood pressure (!) 86/61, pulse (!) 101, temperature (!) 97.3 F (36.3 C), temperature source Oral, resp. rate 18, height 6\' 1"  (1.854 m), weight 130 kg, SpO2 98 %. Physical Exam  Nursing note and vitals reviewed. Constitutional: He is oriented to person, place, and time. He appears well-developed and well-nourished.  obese  HENT:  Head: Normocephalic and atraumatic.  Eyes: Pupils are equal, round, and reactive to light. EOM are normal.  Neck: No thyromegaly present.  Cardiovascular: Normal rate and  regular rhythm. Exam reveals no friction rub.  No murmur heard. Respiratory: Effort normal. No stridor. No respiratory distress. He has no wheezes. He has no rales.  GI: Bowel sounds are normal. He exhibits no distension. There is abdominal tenderness (Not as tender/painful today).  Musculoskeletal:        General: No edema.     Comments: Trace LE edema bilaaterally.   Neurological: He is alert and oriented to person, place, and time. No cranial nerve deficit. Coordination normal.  UE 5/5. LE: 3/5 HF, 3+ KE and 4/5 ADF/PF bilaterally. No focal sensory findings.   Skin: Skin is warm and dry.  Psychiatric: He has a normal mood and affect. His behavior is normal. Thought content  normal.     Results for orders placed or performed during the hospital encounter of 08/19/18 (from the past 48 hour(s))  Protime-INR     Status: Abnormal   Collection Time: 09/03/18  3:18 AM  Result Value Ref Range   Prothrombin Time 26.8 (H) 11.4 - 15.2 seconds   INR 2.5 (H) 0.8 - 1.2    Comment: (NOTE) INR goal varies based on device and disease states. Performed at [facility], [place] [address], [place], [place] [address]   CBC     Status: Abnormal   Collection Time: 09/03/18  3:18 AM  Result Value Ref Range   WBC 9.7 4.0 - 10.5 K/uL   RBC 2.69 (L) 4.22 - 5.81 MIL/uL   Hemoglobin 7.1 (L) 13.0 - 17.0 g/dL   HCT 23.6 (L) 39.0 - 52.0 %   MCV 87.7 80.0 - 100.0 fL   MCH 26.4 26.0 - 34.0 pg   MCHC 30.1 30.0 - 36.0 g/dL   RDW 17.7 (H) 11.5 - 15.5 %   Platelets 337 150 - 400 K/uL   nRBC 0.2 0.0 - 0.2 %    Comment: Performed at [facility], [place] [address], [place], [place] [address]  Protime-INR     Status: Abnormal   Collection Time: 09/04/18  3:25 AM  Result Value Ref Range   Prothrombin Time 30.4 (H) 11.4 - 15.2 seconds   INR 3.0 (H) 0.8 - 1.2    Comment: (NOTE) INR goal varies based on device and disease states. Performed at [facility], [place] [address], [place], [place] [address]    CBC     Status: Abnormal   Collection Time: 09/04/18  3:25 AM  Result Value Ref Range   WBC 9.4 4.0 - 10.5 K/uL   RBC 2.72 (L) 4.22 - 5.81 MIL/uL   Hemoglobin 7.1 (L) 13.0 - 17.0 g/dL   HCT 23.7 (L) 39.0 - 52.0 %   MCV 87.1 80.0 - 100.0 fL   MCH 26.1 26.0 - 34.0 pg   MCHC 30.0 30.0 - 36.0 g/dL   RDW 17.9 (H) 11.5 - 15.5 %   Platelets 330 150 - 400 K/uL   nRBC 0.0 0.0 - 0.2 %    Comment: Performed at [facility], [place] [address] [place], [place] [address]  Renal function panel     Status: Abnormal   Collection Time: 09/04/18  7:15 AM  Result Value Ref Range   Sodium 135 135 - 145 mmol/L   Potassium 3.6 3.5 - 5.1 mmol/L   Chloride 97 (L) 98 - 111 mmol/L   CO2 24 22 - 32 mmol/L   Glucose, Bld 70 70 - 99 mg/dL   BUN 55 (H) 6 - 20 mg/dL   Creatinine, Ser 7.38 (H) 0.61 - 1.24 mg/dL   Calcium 8.0 (L) 8.9 - 10.3 mg/dL   Phosphorus 5.2 (H) 2.5 - 4.6 mg/dL   Albumin 1.4 (L) 3.5 - 5.0 g/dL   GFR calc non Af Amer 8 (L) >60 mL/min   GFR calc Af Amer 9 (L) >60 mL/min   Anion gap 14 5 - 15    Comment: Performed at [facility], [address] [place], [place] [address]   No results found.     Medical Problem List and Plan: 1.  Functional and mobility deficits secondary to debility after multiple medical issues  -admit to inpatient rehab 2.  Antithrombotics: -DVT/anticoagulation:  Pharmaceutical: Coumadin  -antiplatelet therapy:  N/A 3. Pain Management: Will change dilaudid to oxycodone--may  Need to premedicate prior to therapy. Ice prn to abdomen/flank.  4. Mood: LCSW to follow for evaluation and support.   -antipsychotic agents: N/A 5. Neuropsych: This patient is capable of making decisions on his own behalf. 6. Skin/Wound Care: Routine pressure relief measures.  7. Fluids/Electrolytes/Nutrition: Strict I/O with daily weights. Continue to offer nephro supplements and pro-stat.  8. Anemia of chronic disease: Received Feraheme 5/18 and on Aranesp weekly. .  9. CAD  s/p AVR/ICM s/p OEU:MPNTIRW HR BID--continue amiodarone daily. On coumadin--INR goals may need to be lower per teaching service.  10. Polycystic kidney disease/ESRD: HD scheduled at end of the day to allow full participation in therapies  11. Chronic hypotension: On midodrine tid--SBP runs in 90's.   -worse after HD       Bary Leriche, PA-C 09/04/2018

## 2018-09-04 NOTE — H&P (Signed)
Physical Medicine and Rehabilitation Admission H&P        Chief Complaint  Patient presents with   debility       HPI:  [name] [name] is a 53 year old male with history of ESRD due to polycystic kidney disease-- HD TTS, HTN, CAD/AS s/p CABG with mechanical AVR on chronic coumadin recent admission 4/15-4/21 for supratherapeutic INR and ruptured hemorrhagic renal cyst.  He was readmitted on 08/19/2018 with tachycardia, fall a few days prior to admission with difficulty walking and inability to get to HD, LUQ and flank pain radiating to back with hematuria.  CT abdomen at OSH showed increase in size of perinephric hematoma and INR noted to be supratherapeutic at 5.5.  INR was reversed and he was transfused with 1 units packed red blood cells for hemoglobin of 7.6.  Urology consulted and recommended supportive care with bedrest and serial H&H till hemoglobin stable.     He continued to have ongoing blood loss with pain and required 5 total units of PRBC. Dr. Alinda Money recommended consulting radiology consulted for assistance and patient underwent left renal embolization by Dr. Pascal Lux on  05/13.  Coumadin resumed and  INR trending back upwards.  He has been weaned off PCA and physical therapy initiated however patient has been unable to tolerate standing due to BLE weakness or sitting up in a chair due to severe back/flank pain.  CIR was recommended due to functional decline     Review of Systems  Constitutional: Negative for chills and fever.  HENT: Negative for hearing loss and tinnitus.   Eyes: Negative for blurred vision and double vision.  Respiratory: Negative for cough and shortness of breath.   Cardiovascular: Negative for chest pain and palpitations.  Gastrointestinal: Negative for abdominal pain, constipation, heartburn and nausea.  Genitourinary: Positive for hematuria (decreasing in volume).  Musculoskeletal: Positive for back pain and myalgias.  Skin: Negative for itching and  rash.  Neurological: Positive for weakness. Negative for dizziness.  Psychiatric/Behavioral: The patient has insomnia.           Past Medical History:  Diagnosis Date   Aneurysm ([place])       Right arm fistula 3 aneurysms 2011,   plans to have a new procedure   Angina     Chest discomfort     CHF (congestive heart failure) ([place])     Coronary artery disease     Coronary artery disease involving native coronary artery of native heart without angina pectoris     Dialysis patient [facility])     Dysrhythmia     ESRD (end stage renal disease) ([place])      on hemodialysis T_T_S   Hypertension     Leg pain     Mitral regurgitation     Morbid obesity ([facility])     Orthostatic hypotension     Overweight(278.02)     Renal insufficiency     S/P aortic valve replacement with bileaflet mechanical valve 09/01/2017    25 mm Sorin [employer] [place] bileaflet mechanical valve   S/P CABG x 1 09/01/2017    SVG to OM   Severe aortic insufficiency     Sinus tachycardia     Syncope       positional after dialysis... 2008           Past Surgical History:  Procedure Laterality Date   A/V FISTULAGRAM Left 06/30/2016    Procedure: A/V Fistulagram;  Surgeon: Algernon Huxley,  MD;  Location: [place] [facility];  Service: Cardiovascular;  Laterality: Left;   AORTIC VALVE REPLACEMENT N/A 09/01/2017    Procedure: AORTIC VALVE REPLACEMENT (AVR);  Surgeon: [name], MD;  Location: [place];  Service: Open Heart Surgery;  Laterality: N/A;   ARTERIOVENOUS GRAFT PLACEMENT       AV FISTULA PLACEMENT       AV FISTULA PLACEMENT Left 11/18/2015    Procedure: ARTERIOVENOUS (AV) FISTULA CREATION ( RADIOCEPHALIC );  Surgeon: Algernon Huxley, MD;  Location: [facility];  Service: Vascular;  Laterality: Left;   AV FISTULA PLACEMENT Left 03/23/2016    Procedure: ARTERIOVENOUS (AV) FISTULA CREATION ( REVISION );  Surgeon: Algernon Huxley, MD;  Location: [facility];  Service: Vascular;  Laterality: Left;   [place]  REMOVAL Left 03/23/2016    Procedure: REMOVAL OF ARTERIOVENOUS GORETEX GRAFT ([place]);  Surgeon: Algernon Huxley, MD;  Location: [facility];  Service: Vascular;  Laterality: Left;   CENTRAL LINE INSERTION Right 09/01/2017    Procedure: FLOROSCOPY GUIDED PLACEMENT OF RIGHT FEMORAL CENTRAL LINE TIMES TWO  AND PLACEMENT OF SWAN GANZ CATHETER;  Surgeon: [name], MD;  Location: [place];  Service: Open Heart Surgery;  Laterality: Right;   CORONARY ARTERY BYPASS GRAFT N/A 09/01/2017    Procedure: CORONARY ARTERY BYPASS GRAFTING (CABG) X 1, USING RIGHT GREATER SAPHENOUS VEIN HARVESTED ENDOSCOPICALLY;  Surgeon: [name], MD;  Location: [place];  Service: Open Heart Surgery;  Laterality: N/A;   DIALYSIS/PERMA CATHETER REMOVAL N/A 08/01/2016    Procedure: Dialysis/Perma Catheter Removal;  Surgeon: Algernon Huxley, MD;  Location: [place] [facility];  Service: Cardiovascular;  Laterality: N/A;   HERNIA REPAIR       INSERTION OF DIALYSIS CATHETER   03/01/2011    Procedure: INSERTION OF DIALYSIS CATHETER;  Surgeon: Elam Dutch, MD;  Location: [place];  Service: Vascular;  Laterality: Left;  Exchange of Dialysis Catheter to 27cm 15Fr. Arrow Catheter   INSERTION OF DIALYSIS CATHETER   09/01/2017    Procedure: PLACEMENT OF TRIALYSIS SHORT TERM DIALYSIS CATHETER;  Surgeon: [name], MD;  Location: [place] [facility];  Service: Open Heart Surgery;;   IR EMBO ART  VEN HEMORR LYMPH EXTRAV  INC GUIDE ROADMAPPING   08/29/2018   IR FLUORO GUIDE CV LINE RIGHT   08/27/2018   IR RENAL SUPRASEL UNI S&I MOD SED   08/29/2018   IR US GUIDE VASC ACCESS RIGHT   08/27/2018   IR US GUIDE VASC ACCESS RIGHT   08/29/2018   MULTIPLE EXTRACTIONS WITH ALVEOLOPLASTY N/A 08/07/2017    Procedure: Extraction of tooth #2, 9,7,98,92,11,94, and 31 with alveoloplasty and gross debridement of remaining teeth;  Surgeon: [name], DDS;  Location: [place];  Service: Oral Surgery;  Laterality: N/A;   PERIPHERAL VASCULAR CATHETERIZATION N/A  09/01/2014    Procedure: A/V Shuntogram/Fistulagram;  Surgeon: Algernon Huxley, MD;  Location: [place] [facility];  Service: Cardiovascular;  Laterality: N/A;   PERIPHERAL VASCULAR CATHETERIZATION Right 09/01/2014    Procedure: Thrombectomy;  Surgeon: Algernon Huxley, MD;  Location: [place] [facility];  Service: Cardiovascular;  Laterality: Right;   PERIPHERAL VASCULAR CATHETERIZATION Right 09/01/2014    Procedure: A/V Shunt Intervention;  Surgeon: Algernon Huxley, MD;  Location: [place] [facility];  Service: Cardiovascular;  Laterality: Right;   PERIPHERAL VASCULAR CATHETERIZATION N/A 09/17/2014    Procedure: A/V Shuntogram/Fistulagram;  Surgeon: Algernon Huxley, MD;  Location: [place] [facility];  Service:  Cardiovascular;  Laterality: N/A;   PERIPHERAL VASCULAR CATHETERIZATION Right 10/17/2014    Procedure: Thrombectomy;  Surgeon: Katha Cabal, MD;  Location: [place] [facility];  Service: Cardiovascular;  Laterality: Right;   PERIPHERAL VASCULAR CATHETERIZATION N/A 10/17/2014    Procedure: A/V Shuntogram/Fistulagram;  Surgeon: Katha Cabal, MD;  Location: [place] [facility];  Service: Cardiovascular;  Laterality: N/A;   PERIPHERAL VASCULAR CATHETERIZATION N/A 10/17/2014    Procedure: A/V Shunt Intervention;  Surgeon: Katha Cabal, MD;  Location: [place] [facility];  Service: Cardiovascular;  Laterality: N/A;   PERIPHERAL VASCULAR CATHETERIZATION Right 11/04/2014    Procedure: Thrombectomy;  Surgeon: Katha Cabal, MD;  Location: [place] [facility];  Service: Cardiovascular;  Laterality: Right;   PERIPHERAL VASCULAR CATHETERIZATION Left 11/04/2014    Procedure: Visceral Venography;  Surgeon: Katha Cabal, MD;  Location: [place] [facility];  Service: Cardiovascular;  Laterality: Left;   PERIPHERAL VASCULAR CATHETERIZATION Right 11/27/2014    Procedure: A/V Shuntogram/Fistulagram;  Surgeon: Algernon Huxley, MD;  Location: [place] [facility];  Service: Cardiovascular;  Laterality:  Right;   PERIPHERAL VASCULAR CATHETERIZATION Right 11/27/2014    Procedure: A/V Shunt Intervention;  Surgeon: Algernon Huxley, MD;  Location: [place] [facility];  Service: Cardiovascular;  Laterality: Right;   PERIPHERAL VASCULAR CATHETERIZATION Right 12/31/2014    Procedure: Thrombectomy;  Surgeon: Algernon Huxley, MD;  Location: [place] [facility];  Service: Cardiovascular;  Laterality: Right;   PERIPHERAL VASCULAR CATHETERIZATION Right 01/12/2015    Procedure: A/V Shuntogram/Fistulagram;  Surgeon: Algernon Huxley, MD;  Location: [place] [facility];  Service: Cardiovascular;  Laterality: Right;   PERIPHERAL VASCULAR CATHETERIZATION N/A 01/12/2015    Procedure: A/V Shunt Intervention;  Surgeon: Algernon Huxley, MD;  Location: [place] [facility];  Service: Cardiovascular;  Laterality: N/A;   PERIPHERAL VASCULAR CATHETERIZATION Right 01/28/2015    Procedure: Thrombectomy;  Surgeon: Algernon Huxley, MD;  Location: [place] [facility];  Service: Cardiovascular;  Laterality: Right;   PERIPHERAL VASCULAR CATHETERIZATION N/A 06/08/2015    Procedure: A/V Shuntogram/Fistulagram;  Surgeon: Algernon Huxley, MD;  Location: [place] [facility];  Service: Cardiovascular;  Laterality: N/A;   PERIPHERAL VASCULAR CATHETERIZATION N/A 06/08/2015    Procedure: A/V Shunt Intervention;  Surgeon: Algernon Huxley, MD;  Location: [place] [facility];  Service: Cardiovascular;  Laterality: N/A;   PERIPHERAL VASCULAR CATHETERIZATION Right 07/06/2015    Procedure: A/V Shuntogram/Fistulagram;  Surgeon: Algernon Huxley, MD;  Location: [place] [facility];  Service: Cardiovascular;  Laterality: Right;   PERIPHERAL VASCULAR CATHETERIZATION N/A 07/06/2015    Procedure: A/V Shunt Intervention;  Surgeon: Algernon Huxley, MD;  Location: [place] [facility];  Service: Cardiovascular;  Laterality: N/A;   PERIPHERAL VASCULAR CATHETERIZATION N/A 08/04/2015    Procedure: graft declot;  Surgeon: Katha Cabal, MD;  Location: [place] [facility];   Service: Cardiovascular;  Laterality: N/A;   PERIPHERAL VASCULAR CATHETERIZATION Right 08/25/2015    Procedure: A/V Shuntogram/Fistulagram;  Surgeon: Katha Cabal, MD;  Location: [place] [facility];  Service: Cardiovascular;  Laterality: Right;   PERIPHERAL VASCULAR CATHETERIZATION N/A 11/04/2015    Procedure: Dialysis/Perma Catheter Insertion;  Surgeon: Algernon Huxley, MD;  Location: [place] [facility];  Service: Cardiovascular;  Laterality: N/A;   PERIPHERAL VASCULAR CATHETERIZATION Left 12/14/2015    Procedure: A/V Shuntogram/Fistulagram;  Surgeon: Algernon Huxley, MD;  Location: [place] [facility];  Service: Cardiovascular;  Laterality: Left;   PERIPHERAL  VASCULAR CATHETERIZATION N/A 12/14/2015    Procedure: A/V Shunt Intervention;  Surgeon: Algernon Huxley, MD;  Location: [place] [facility];  Service: Cardiovascular;  Laterality: N/A;   PERIPHERAL VASCULAR CATHETERIZATION N/A 12/17/2015    Procedure: Dialysis/Perma Catheter Insertion;  Surgeon: Algernon Huxley, MD;  Location: [place] [facility];  Service: Cardiovascular;  Laterality: N/A;   PERIPHERAL VASCULAR CATHETERIZATION Left 12/22/2015    Procedure: Dialysis/Perma Catheter Insertion;  Surgeon: Katha Cabal, MD;  Location: [place] [facility];  Service: Cardiovascular;  Laterality: Left;   PERIPHERAL VASCULAR CATHETERIZATION Left 02/29/2016    Procedure: A/V Shuntogram/Fistulagram;  Surgeon: Algernon Huxley, MD;  Location: [place] [facility];  Service: Cardiovascular;  Laterality: Left;   PERIPHERAL VASCULAR CATHETERIZATION N/A 02/29/2016    Procedure: A/V Shunt Intervention;  Surgeon: Algernon Huxley, MD;  Location: [place] [facility];  Service: Cardiovascular;  Laterality: N/A;   POCKET REVISION/RELOCATION N/A 01/01/2018    Procedure: POCKET REVISION/RELOCATION;  Surgeon: Evans Lance, MD;  Location: [place] [facility];  Service: Cardiovascular;  Laterality: N/A;   right arm graft        for dyalisis   RIGHT/LEFT HEART CATH  AND CORONARY ANGIOGRAPHY N/A 08/02/2017    Procedure: RIGHT/LEFT HEART CATH AND CORONARY ANGIOGRAPHY;  Surgeon: Larey Dresser, MD;  Location: [place] [facility];  Service: Cardiovascular;  Laterality: N/A;   SUBQ ICD IMPLANT N/A 11/28/2017    Procedure: SUBQ ICD IMPLANT;  Surgeon: Evans Lance, MD;  Location: [place] [facility];  Service: Cardiovascular;  Laterality: N/A;   SUBQ ICD REVISION N/A 02/12/2018    Procedure: SUBQ ICD REVISION(REMOVAL);  Surgeon: Evans Lance, MD;  Location: [place] [facility];  Service: Cardiovascular;  Laterality: N/A;   TEE WITHOUT CARDIOVERSION N/A 07/31/2017    Procedure: TRANSESOPHAGEAL ECHOCARDIOGRAM (TEE);  Surgeon: Josue Hector, MD;  Location[facility] ENDOSCOPY;  Service: Cardiovascular;  Laterality: N/A;   TEE WITHOUT CARDIOVERSION N/A 09/01/2017    Procedure: TRANSESOPHAGEAL ECHOCARDIOGRAM (TEE);  Surgeon: [name], MD;  Location: [place];  Service: Open Heart Surgery;  Laterality: N/A;   THROMBECTOMY       THROMBECTOMY W/ EMBOLECTOMY   03/01/2011    Procedure: THROMBECTOMY ARTERIOVENOUS GORE-TEX GRAFT;  Surgeon: Elam Dutch, MD;  Location: [place];  Service: Vascular;  Laterality: Right;  Attempted Thrombectomy of Old  Right Upper Arm Arteriovenous gortex Graft. Insertion of new Arteriovenous Graft using 25mm x 50cm Gortex Stretch graft.    THROMBECTOMY W/ EMBOLECTOMY   07/11/2011    Procedure: THROMBECTOMY ARTERIOVENOUS GORE-TEX GRAFT;  Surgeon: Rosetta Posner, MD;  Location: [facility] OR;  Service: Vascular;  Laterality: Right;   UMBILICAL HERNIA REPAIR       VENOGRAM N/A 08/23/2011    Procedure: VENOGRAM;  Surgeon: [name], MD;  Location: [facility] CATH LAB;  Service: Cardiovascular;  Laterality: N/A;           Family History  Problem Relation Age of Onset   Hypertension Mother     Heart disease Mother     Hypertension Father     Heart disease Father        Social History:  Married. Used to work in a factory--disabled due to ESRD.   Independent without AD. He reports that he quit smoking about 24 years ago. His smoking use included cigarettes. His smokeless tobacco use includes snuff--one pack a week. He reports that he does not drink alcohol or use drugs.  Allergies: No Known Allergies            Medications Prior to Admission  Medication Sig Dispense Refill   amiodarone (PACERONE) 200 MG tablet Take 1 tablet (200 mg total) by mouth daily. (Patient taking differently: Take 200 mg by mouth at bedtime. ) 90 tablet 3   atorvastatin (LIPITOR) 80 MG tablet Take 1 tablet (80 mg total) by mouth daily at 6 PM. (Patient taking differently: Take 80 mg by mouth daily. ) 90 tablet 3   Cinacalcet HCl (SENSIPAR PO) Take 1 tablet by mouth daily.       lanthanum (FOSRENOL) 500 MG chewable tablet Chew 500-1,000 mg by mouth See admin instructions. Take 2 tablets (1000 mg) by mouth three times daily with meals, take 1 tablet (500 mg) with snacks       midodrine (PROAMATINE) 5 MG tablet Take 3 tablets (15 mg total) by mouth 3 (three) times daily with meals. (Patient taking differently: Take 5 mg by mouth daily. ) 810 tablet 3   warfarin (COUMADIN) 5 MG tablet Take 1 tablet daily except 1/2 tablet on Wednesdays and Sundays or as directed by coumadin clinic (Patient taking differently: Take 2.5-5 mg by mouth See admin instructions. Take 1/2 tablet (2.5 mg) by mouth on Sunday and Tuesday nights, take 1 tablet (5 mg) on Monday, Wednesday, Thursday, Friday, Saturday nights or as directed by coumadin clinic) 90 tablet 1      Drug Regimen Review  Drug regimen was reviewed and remains appropriate with no significant issues identified   Home: Home Living Family/patient expects to be discharged to:: Private residence Living Arrangements: Spouse/significant other Available Help at Discharge: Family, Available 24 hours/day Type of Home: House Home Access: Stairs to enter [employer] of Steps: 6 Entrance Stairs-Rails: Can reach  both Home Layout: One level Bathroom Shower/Tub: Chiropodist: [place]: Environmental consultant - 2 wheels, Shower seat, Wheelchair - manual   Functional History: Prior Function Level of Independence: Independent Comments: does report hx of falls over last 6 months   Functional Status:  Mobility: Bed Mobility Overal bed mobility: Needs Assistance Bed Mobility: Supine to Sit Supine to sit: Min assist, Mod assist General bed mobility comments: min A to come to sitting; mod assist to help LEs back onto bed; dizzy upon initial sitting upright Transfers Overall transfer level: Needs assistance Equipment used: Rolling walker (2 wheeled) Transfers: Sit to/from Stand, Lateral/Scoot Transfers Sit to Stand: +2 physical assistance, Max assist, From elevated surface  Lateral/Scoot Transfers: Mod assist General transfer comment: 2 attempts at sit to stand with 2 person assist and bed elevated; first attempt unsuccessful; Second attempt, Mr. Goheen stood for less than 20 seconds; cues to shift weight forward to initiate; he indicated a fear of falling forward; Light mod assist with simulated lateral scoot transfers, scooting towards head of bed Ambulation/Gait General Gait Details: Unable today   ADL:   Cognition: Cognition Overall Cognitive Status: Within Functional Limits for tasks assessed Orientation Level: Oriented X4 Cognition Arousal/Alertness: Awake/alert Behavior During Therapy: WFL for tasks assessed/performed Overall Cognitive Status: Within Functional Limits for tasks assessed     Blood pressure (!) 86/61, pulse (!) 101, temperature (!) 97.3 F (36.3 C), temperature source Oral, resp. rate 18, height 6\' 1"  (1.854 m), weight 130 kg, SpO2 98 %. Physical Exam  Nursing note and vitals reviewed. Constitutional: He is oriented to person, place, and time. He appears well-developed and well-nourished.  obese  HENT:  Head: Normocephalic and atraumatic.  Eyes:  Pupils are equal, round, and reactive to light. EOM are normal.  Neck: No thyromegaly present.  Cardiovascular: Normal rate and regular rhythm. Exam reveals no friction rub.  No murmur heard. Respiratory: Effort normal. No stridor. No respiratory distress. He has no wheezes. He has no rales.  GI: Bowel sounds are normal. He exhibits no distension. There is abdominal tenderness (Not as tender/painful today).  Musculoskeletal:        General: No edema.     Comments: Trace LE edema bilaaterally.   Neurological: He is alert and oriented to person, place, and time. No cranial nerve deficit. Coordination normal.  UE 5/5. LE: 3/5 HF, 3+ KE and 4/5 ADF/PF bilaterally. No focal sensory findings.   Skin: Skin is warm and dry.  Psychiatric: He has a normal mood and affect. His behavior is normal. Thought content normal.        Lab Results Last 48 Hours        Results for orders placed or performed during the hospital encounter of 08/19/18 (from the past 48 hour(s))  Protime-INR     Status: Abnormal    Collection Time: 09/03/18  3:18 AM  Result Value Ref Range    Prothrombin Time 26.8 (H) 11.4 - 15.2 seconds    INR 2.5 (H) 0.8 - 1.2      Comment: (NOTE) INR goal varies based on device and disease states. Performed at [facility], [place] [address], [place], [place] [address]    CBC     Status: Abnormal    Collection Time: 09/03/18  3:18 AM  Result Value Ref Range    WBC 9.7 4.0 - 10.5 K/uL    RBC 2.69 (L) 4.22 - 5.81 MIL/uL    Hemoglobin 7.1 (L) 13.0 - 17.0 g/dL    HCT 23.6 (L) 39.0 - 52.0 %    MCV 87.7 80.0 - 100.0 fL    MCH 26.4 26.0 - 34.0 pg    MCHC 30.1 30.0 - 36.0 g/dL    RDW 17.7 (H) 11.5 - 15.5 %    Platelets 337 150 - 400 K/uL    nRBC 0.2 0.0 - 0.2 %      Comment: Performed at [facility], [place] [address] [place], [place] [address]  Protime-INR     Status: Abnormal    Collection Time: 09/04/18  3:25 AM  Result Value Ref Range    Prothrombin Time 30.4 (H) 11.4 -  15.2 seconds    INR 3.0 (H) 0.8 - 1.2      Comment: (NOTE) INR goal varies based on device and disease states. Performed at [facility], [place] [address] [place], [place] [address]    CBC     Status: Abnormal    Collection Time: 09/04/18  3:25 AM  Result Value Ref Range    WBC 9.4 4.0 - 10.5 K/uL    RBC 2.72 (L) 4.22 - 5.81 MIL/uL    Hemoglobin 7.1 (L) 13.0 - 17.0 g/dL    HCT 23.7 (L) 39.0 - 52.0 %    MCV 87.1 80.0 - 100.0 fL    MCH 26.1 26.0 - 34.0 pg    MCHC 30.0 30.0 - 36.0 g/dL    RDW 17.9 (H) 11.5 - 15.5 %    Platelets 330 150 - 400 K/uL    nRBC 0.0 0.0 - 0.2 %      Comment: Performed at [facility], [place] [address] [place], [place]  [address]  Renal function panel     Status: Abnormal    Collection Time: 09/04/18  7:15 AM  Result Value Ref Range    Sodium 135 135 - 145 mmol/L    Potassium 3.6 3.5 - 5.1 mmol/L    Chloride 97 (L) 98 - 111 mmol/L    CO2 24 22 - 32 mmol/L    Glucose, Bld 70 70 - 99 mg/dL    BUN 55 (H) 6 - 20 mg/dL    Creatinine, Ser 7.38 (H) 0.61 - 1.24 mg/dL    Calcium 8.0 (L) 8.9 - 10.3 mg/dL    Phosphorus 5.2 (H) 2.5 - 4.6 mg/dL    Albumin 1.4 (L) 3.5 - 5.0 g/dL    GFR calc non Af Amer 8 (L) >60 mL/min    GFR calc Af Amer 9 (L) >60 mL/min    Anion gap 14 5 - 15      Comment: Performed at [facility], [address] [place], [place] [address]      Imaging Results (Last 48 hours)  No results found.           Medical Problem List and Plan: 1.  Functional and mobility deficits secondary to debility after multiple medical issues             -admit to inpatient rehab 2.  Antithrombotics: -DVT/anticoagulation:  Pharmaceutical: Coumadin             -antiplatelet therapy: N/A 3. Pain Management: Will change dilaudid to oxycodone--may  Need to premedicate prior to therapy. Ice prn to abdomen/flank.  4. Mood: LCSW to follow for evaluation and support.              -antipsychotic agents: N/A 5. Neuropsych: This patient is capable  of making decisions on his own behalf. 6. Skin/Wound Care: Routine pressure relief measures.  7. Fluids/Electrolytes/Nutrition: Strict I/O with daily weights. Continue to offer nephro supplements and pro-stat.  8. Anemia of chronic disease: Received Feraheme 5/18 and on Aranesp weekly. .  9. CAD s/p AVR/ICM s/p KZS:WFUXNAT HR BID--continue amiodarone daily. On coumadin--INR goals may need to be lower per teaching service.  10. Polycystic kidney disease/ESRD: HD scheduled at end of the day to allow full participation in therapies  11. Chronic hypotension: On midodrine tid--SBP runs in 90's.              -worse after HD       Post Admission Physician Evaluation: 1. Functional deficits secondary  to debility. 2. Patient is admitted to receive collaborative, interdisciplinary care between the physiatrist, rehab nursing staff, and therapy team. 3. Patient's level of medical complexity and substantial therapy needs in context of that medical necessity cannot be provided at a lesser intensity of care such as a SNF. 4. Patient has experienced substantial functional loss from his/her baseline which was documented above under the "Functional History" and "Functional Status" headings.  Judging by the patient's diagnosis, physical exam, and functional history, the patient has potential for functional progress which will result in measurable gains while on inpatient rehab.  These gains will be of substantial and practical use upon discharge  in facilitating mobility and self-care at the household level. 5. Physiatrist will provide 24 hour management of medical needs as well as oversight of the therapy plan/treatment and provide guidance as appropriate regarding the interaction of the two. 6. The Preadmission Screening has been reviewed and patient status is unchanged unless otherwise stated above. 7. 24 hour rehab  nursing will assist with bladder management, bowel management, safety, skin/wound care, disease  management, medication administration, pain management and patient education  and help integrate therapy concepts, techniques,education, etc. 8. PT will assess and treat for/with: Lower extremity strength, range of motion, stamina, balance, functional mobility, safety, adaptive techniques and equipment .   Goals are: supervision to mod I. 9. OT will assess and treat for/with: ADL's, functional mobility, safety, upper extremity strength, adaptive techniques and equipment, pain mgt.   Goals are: supervision to mod I. Therapy may proceed with showering this patient. 10. SLP will assess and treat for/with: n/a.  Goals are: n/a. 11. Case Management and Social Worker will assess and treat for psychological issues and discharge planning. 12. Team conference will be held weekly to assess progress toward goals and to determine barriers to discharge. 13. Patient will receive at least 3 hours of therapy per day at least 5 days per week. 14. ELOS: 10-12 days       15. Prognosis:  excellent   I have personally performed a face to face diagnostic evaluation of this patient and formulated the key components of the plan.  Additionally, I have personally reviewed laboratory data, imaging studies, as well as relevant notes and concur with the physician assistant's documentation above.  Meredith Staggers, MD, [place], PA-C 09/04/2018

## 2018-09-04 NOTE — Progress Notes (Signed)
Inpatient Rehab Admissions:  Inpatient Rehab Consult received.  I met with patient at the bedside, and spoke with pt's wife over the phone, for rehabilitation assessment and to discuss goals and expectations of an inpatient rehab admission.  Both are interested in CIR stay and wife can provide 24/7 assist between her and their daughter (35) at discharge.  I have approval from Dr. Koleen Distance for admission today.  I will alert pt/family, RN, and CM.   Signed: Shann Medal, PT, DPT Admissions Coordinator [phone] 09/04/18  10:25 AM

## 2018-09-04 NOTE — Progress Notes (Signed)
   Subjective: [name] [name] was seen while receiving HD this morning. He was feeling well without any acute complaints. His abdominal pain is well controlled. He still notes some intermittent bleeding from his urethra.   Objective:  Vital signs in last 24 hours: Vitals:   09/03/18 0515 09/03/18 0903 09/03/18 2137 09/04/18 0423  BP: 109/75 133/66 91/65 97/61   Pulse: 88 89 93 99  Resp: 20 18 17 17   Temp: 98 F (36.7 C) 97.8 F (36.6 C) 97.8 F (36.6 C) 97.8 F (36.6 C)  TempSrc: Oral Oral Oral Oral  SpO2: 97% 94% 96% 96%  Weight:   129.6 kg   Height:       General: awake, alert, lying comfortably in bed in NAD Abd: soft, non-distended, non-tender   Assessment/Plan:  Principal Problem:   Perinephric hematoma Active Problems:   ESRD (end stage renal disease) (HCC)   Morbid obesity (HCC)   Systolic CHF (HCC)   S/P aortic valve replacement with bileaflet mechanical valve   Acute blood loss anemia   Gross hematuria   Supratherapeutic INR  [name] [name] is a 53 yo man with CAD s/p CABG, CHF, ESRD on HD, polycystic kidney disease, and aortic valve replacement on Coumadin who presented withabdominalpain and bleeding from the penissecondary to an expanding left-sided perinephric hematoma from PCKDin the setting ofsupratherapeutic INR.He continued to have bleeding and fluctuating INR despite holding warfarin and required total of 7 units of pRBCs. Underwent transcatheter embolization of the left kidney 5/13. Since the procedure, his hgb has remained stable. He was resumed back on Warfarin with heparin bridge until INR becomes therapeutic.   L perinephric hematoma: complicated by mechanical valve. S/p embolization of left renal artery  - INR supratherapeutic at 3 today; will recommend pharmacy continue to work on Coumadin management while in [place]  - hgb stable - pain well controlled - stable for discharge to inpatient rehab today   ESRD on HD TTS: -appreciate nephrology  managing inpatient HD  - Continue home fosrenol  Hypotension:Chronic hypotension with MAPs 60-65.Asymptomatic.Blood pressurestable. - Continue home midodrine 15mg  TID -Discontinued homemetoprolol  Malnutrition: Poor PO intake since admission.Seems more related to patient's distaste for hospital food - recommend outpatient work-up with PCP for chronic loose stools   Dispo: Anticipated discharge to CIR today.   [name], DO 09/04/2018, 6:44 AM Pager: [phone]

## 2018-09-04 NOTE — Progress Notes (Signed)
[place] for warfarin Indication: Mechanical valve  No Known Allergies  Patient Measurements: Height: 6\' 1"  (185.4 cm) Weight: 283 lb 1.1 oz (128.4 kg) IBW/kg (Calculated) : 79.9  Vital Signs: Temp: 97.8 F (36.6 C) (05/19 1151) Temp Source: Oral (05/19 1151) BP: 105/64 (05/19 1151) Pulse Rate: 101 (05/19 1151)  Labs: Recent Labs    09/01/18 1605  09/02/18 0323 09/03/18 0318 09/04/18 0325 09/04/18 0715  HGB  --    < > 7.0* 7.1* 7.1*  --   HCT  --   --  23.0* 23.6* 23.7*  --   PLT  --   --  319 337 330  --   LABPROT  --   --  22.4* 26.8* 30.4*  --   INR  --   --  2.0* 2.5* 3.0*  --   HEPARINUNFRC 0.31  --  0.34  --   --   --   CREATININE  --   --   --   --   --  7.38*   < > = values in this interval not displayed.    Estimated Creatinine Clearance: 16.3 mL/min (A) (by C-G formula based on SCr of 7.38 mg/dL (H)).  Assessment: 76 YOM on warfarin PTA for mech aortic valve with subtherapeutic INR started on heparin with no bolus and tight goals d/t bleed risk with current hematuria. Patient now s/p embolization on 5/13. Last dose warfarin PTA was on 5/2 and patient is s/p 8 mg Vitamin K. INR goal has been decreased to 2-2.5  The patient's INR today is slightly SUPRAtherapeutic or the specified goal range (INR 3 << 2.5, goal of 2-2.5). Hgb 7.1, plts wnl. Some penile bleeding noted - but not overt bleeding at this time.Planning d/c to CIR.   Goal of Therapy:  INR 2-2.5 Monitor platelets by anticoagulation protocol: Yes   Plan:  - Hold warfarin dose today - Will continue to monitor for any signs/symptoms of bleeding and will follow up with PT/INR in the a.m.   Thank you for allowing pharmacy to be a part of this patient's care.  Alycia Rossetti, PharmD, BCPS Clinical Pharmacist Clinical phone for 09/04/2018: [phone] 09/04/2018 3:09 PM   **Pharmacist phone directory can now be found on [email or web address] ([email or web address]).  Listed under [place].

## 2018-09-04 NOTE — Progress Notes (Signed)
Renal Navigator notified OP HD clinic/[facility] of plan for hospital discharge today with admission to CIR. Renal Navigator will follow patient for discharge from CIR and send hospital discharge summary and most recent HD treatment sheet to OP HD clinic at that time in order to provide continuity of care.  Alphonzo Cruise Renal Navigator [phone]

## 2018-09-04 NOTE — Progress Notes (Signed)
[facility] ROUNDING NOTE   Subjective:   This is a 53 year old gentleman end-stage renal disease secondary to polycystic kidney disease, currently on dialysis Tuesday Thursday Saturday at [facility] clinic followed by Dr. Lowanda Foster.  He has a history of chronic hypotension and continues on Midodrin.  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  He has congestive heart failure with systolic dysfunction with an ejection fraction about 30% per 2D echo 10/25/2017. He presented with a left-sided perinephric hematoma in the setting of a supratherapeutic INR required a total of 7 units packed red blood cells and transcatheter embolization of left kidney 08/29/2018.  Patient was seen in dialysis appears to be tolerating his dialysis treatment well.   Blood pressure 90/48 pulse 96 O2 sats 98% room air temperature 97.3  Sodium 135 potassium 3.6 chloride 24 BUN 55 creatinine 7.38 CO2 24.  Phosphorus 5.2 albumin 1.4 calcium 8.0.  WBC 9.4 hemoglobin 7.1 platelets 330.   Amiodarone 200 mg daily atorvastatin 80 mg daily , Proamatidine 15 mg 3 times daily, Fosrenol 1 g with meals 500 mg with snacks, Aranesp 100 mcg q. Tuesday, Coumadin per INR, Feraheme 510 mg x 1 09/03/2018    Objective:  Vital signs in last 24 hours:  Temp:  [97.3 F (36.3 C)-97.8 F (36.6 C)] 97.3 F (36.3 C) (05/19 0700) Pulse Rate:  [89-99] 96 (05/19 0930) Resp:  [17-18] 18 (05/19 0700) BP: (89-105)/(48-65) 90/48 (05/19 0930) SpO2:  [96 %-98 %] 98 % (05/19 0700) Weight:  [129.6 kg-130 kg] 130 kg (05/19 0700)  Weight change:  Filed Weights   09/01/18 2058 09/03/18 2137 09/04/18 0700  Weight: 107 kg 129.6 kg 130 kg    Intake/Output: I/O last 3 completed shifts: In: 480 [P.O.:480] Out: 0    Intake/Output this shift:  No intake/output data recorded.  Alert pleasant gentleman obese nondistressed CVS- RRR no elevated JVP prosthetic  heart sound RS- CTA ABD- BS present soft non-distended EXT- no edema left AV fistula with thrill and bruit   Basic Metabolic Panel: Recent Labs  Lab 08/29/18 0400 08/30/18 0501 09/01/18 0737 09/04/18 0715  NA 135 134* 134* 135  K 3.6 4.0 3.8 3.6  CL 96* 95* 93* 97*  CO2 28 26 25 24   GLUCOSE 86 62* 68* 70  BUN 37* 51* 40* 55*  CREATININE 5.66* 7.06* 6.22* 7.38*  CALCIUM 8.3* 8.1* 8.2* 8.0*  PHOS 4.4 5.5* 4.4 5.2*    Liver Function Tests: Recent Labs  Lab 08/29/18 0400 08/30/18 0501 09/01/18 0737 09/04/18 0715  ALBUMIN 1.4* 1.4* 1.4* 1.4*   No results for input(s): LIPASE, AMYLASE in the last 168 hours. No results for input(s): AMMONIA in the last 168 hours.  CBC: Recent Labs  Lab 09/01/18 0359 09/01/18 0737 09/02/18 0323 09/03/18 0318 09/04/18 0325  WBC 14.2* 13.2* 10.3 9.7 9.4  HGB 7.2* 7.3* 7.0* 7.1* 7.1*  HCT 23.5* 24.0* 23.0* 23.6* 23.7*  MCV 87.4 86.6 87.5 87.7 87.1  PLT 342 329 319 337 330    Cardiac Enzymes: No results for input(s): CKTOTAL, CKMB, CKMBINDEX, TROPONINI in the last 168 hours.  BNP: Invalid input(s): POCBNP  CBG: No results for input(s): GLUCAP in the last 168 hours.  Microbiology: Results for orders placed or performed during the hospital encounter of 08/19/18  SARS Coronavirus 2 (CEPHEID - Performed in [facility] hospital lab), Hosp Order     Status: None   Collection Time: 08/19/18  5:11 PM  Result Value Ref Range Status   SARS Coronavirus 2 NEGATIVE NEGATIVE Final    Comment: (NOTE) If result is NEGATIVE SARS-CoV-2 target nucleic acids are NOT DETECTED. The SARS-CoV-2 RNA is generally detectable in upper and lower  respiratory specimens during the acute phase of infection. The lowest  concentration of SARS-CoV-2 viral copies this assay can detect is 250  copies / mL. A negative result does not preclude SARS-CoV-2 infection  and should not be used as the sole basis for treatment or other  patient management decisions.   A negative result may occur with  improper specimen collection / handling, submission of specimen other  than nasopharyngeal swab, presence of viral mutation(s) within the  areas targeted by this assay, and inadequate number of viral copies  (<250 copies / mL). A negative result must be combined with clinical  observations, patient history, and epidemiological information. If result is POSITIVE SARS-CoV-2 target nucleic acids are DETECTED. The SARS-CoV-2 RNA is generally detectable in upper and lower  respiratory specimens dur ing the acute phase of infection.  Positive  results are indicative of active infection with SARS-CoV-2.  Clinical  correlation with patient history and other diagnostic information is  necessary to determine patient infection status.  Positive results do  not rule out bacterial infection or co-infection with other viruses. If result is PRESUMPTIVE POSTIVE SARS-CoV-2 nucleic acids MAY BE PRESENT.   A presumptive positive result was obtained on the submitted specimen  and confirmed on repeat testing.  While 2019 novel coronavirus  (SARS-CoV-2) nucleic acids may be present in the submitted sample  additional confirmatory testing may be necessary for epidemiological  and / or clinical management purposes  to differentiate between  SARS-CoV-2 and other Sarbecovirus currently known to infect humans.  If clinically indicated additional testing with an alternate test  methodology [phone]) is advised. The SARS-CoV-2 RNA is generally  detectable in upper and lower respiratory sp ecimens during the acute  phase of infection. The expected result is Negative. Fact Sheet for Patients:  StrictlyIdeas.no Fact Sheet for Healthcare Providers: BankingDealers.co.za This test is not yet approved or cleared by the [place] FDA and has been authorized for detection and/or diagnosis of SARS-CoV-2 by FDA under an Emergency Use  Authorization (EUA).  This EUA will remain in effect (meaning this test can be used) for the duration of the COVID-19 declaration under Section 564(b)(1) of the Act, 21 U.S.C. section 360bbb-3(b)(1), unless the authorization is terminated or revoked sooner. Performed at [facility], [place] [address] [place], [place] [address]     Coagulation Studies: Recent Labs    09/02/18 0323 09/03/18 0318 09/04/18 0325  LABPROT 22.4* 26.8* 30.4*  INR 2.0* 2.5* 3.0*    Urinalysis: No results for input(s): COLORURINE, LABSPEC, PHURINE, GLUCOSEU, HGBUR, BILIRUBINUR, KETONESUR, PROTEINUR, UROBILINOGEN, NITRITE, LEUKOCYTESUR in the last 72 hours.  Invalid input(s): APPERANCEUR    Imaging: No results found.   Medications:   . sodium chloride    . sodium chloride     . sodium chloride   Intravenous Once  . acetaminophen  650 mg Oral Q6H  . amiodarone  200 mg Oral Daily  . atorvastatin  80 mg Oral q1800  . Chlorhexidine Gluconate Cloth  6 each Topical Q0600  . darbepoetin (ARANESP) injection - DIALYSIS  100 mcg Intravenous Q Tue-HD  . feeding supplement (NEPRO CARB STEADY)  237 mL Oral BID BM  . feeding supplement (PRO-STAT SUGAR FREE 64)  30 mL Oral  TID  . lanthanum  1,000 mg Oral TID PC  . midodrine  15 mg Oral TID WC  . multivitamin  1 tablet Oral QHS  . sodium chloride flush  10-40 mL Intracatheter Q12H  . Warfarin - Pharmacist Dosing Inpatient   Does not apply q1800   sodium chloride, sodium chloride, acetaminophen **OR** acetaminophen, alteplase, heparin, HYDROmorphone (DILAUDID) injection, iohexol, lanthanum, lidocaine (PF), lidocaine-prilocaine, pentafluoroprop-tetrafluoroeth  Assessment/ Plan:   ESRD-Tuesday Thursday dialysis currently dialyzes at [facility] unit.  He appears stable for discharge to me with no change in hemoglobin.  He can follow-up with his nephrologist at his kidney center on discharge.  Patient was seen in receiving his dialysis treatment  today.  Disposition a little tricky due to debilitation after prolonged hospitalization  ANEMIA-patient has received IV iron and darbepoetin.  Acute blood loss anemia secondary to perinephric hematoma.  Status post embolization.  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  Continues on binders that are non-calcium based  HTN/VOL-congestive heart failure with ejection fraction 30% midodrine 15 mg 3 times daily for blood pressure support.  About 1 L of fluid removed 09/01/2018  Coronary artery disease status status post CABG 2019 history of systolic heart failure.  Hyperlipidemia on statins  AVR with admission INR supratherapeutic retroperitoneal bleed  Recurrent left perinephric hematoma with supratherapeutic INR status post ablation  Atrial fibrillation continues on amiodarone and anticoagulation   LOS: [place] @TODAY @10 :05 AM

## 2018-09-05 ENCOUNTER — Inpatient Hospital Stay (HOSPITAL_COMMUNITY): Payer: Medicare Other

## 2018-09-05 ENCOUNTER — Inpatient Hospital Stay (HOSPITAL_COMMUNITY): Payer: Medicare Other | Admitting: Physical Therapy

## 2018-09-05 ENCOUNTER — Inpatient Hospital Stay (HOSPITAL_COMMUNITY): Payer: Medicare Other | Admitting: Occupational Therapy

## 2018-09-05 LAB — PROTIME-INR
INR: 3.1 — ABNORMAL HIGH (ref 0.8–1.2)
Prothrombin Time: 31.2 seconds — ABNORMAL HIGH (ref 11.4–15.2)

## 2018-09-05 MED ORDER — CHLORHEXIDINE GLUCONATE CLOTH 2 % EX PADS: 6.0000 | MEDICATED_PAD | Freq: Every day | CUTANEOUS | Status: DC

## 2018-09-05 MED ORDER — SODIUM CHLORIDE 0.9% FLUSH
10.0000 mL | INTRAVENOUS | Status: DC | PRN
  Administered 2018-09-06 – 2018-09-10 (×5): 10 mL
  Administered 2018-09-10: 20 mL
  Filled 2018-09-05 (×6): qty 40

## 2018-09-05 NOTE — Progress Notes (Signed)
Patient information reviewed and entered into eRehab System by Becky Leilah Polimeni, PPS coordinator. Information including medical coding, function ability, and quality indicators will be reviewed and updated through discharge.   

## 2018-09-05 NOTE — Discharge Instructions (Signed)
Inpatient Rehab Discharge Instructions  HAWLEY MICHEL Discharge date and time:    Activities/Precautions/ Functional Status: Activity: no lifting, driving, or strenuous exercise till cleared by MD Diet: renal diet 1200 fluid /day Wound Care: keep wound clean and dry    Functional status:  ___ No restrictions     ___ Walk up steps independently ___ 24/7 supervision/assistance   ___ Walk up steps with assistance ___ Intermittent supervision/assistance  ___ Bathe/dress independently ___ Walk with walker     ___ Bathe/dress with assistance ___ Walk Independently    ___ Shower independently ___ Walk with assistance    ___ Shower with assistance ___ No alcohol     ___ Return to work/school ________  Special Instructions:    My questions have been answered and I understand these instructions. I will adhere to these goals and the provided educational materials after my discharge from the hospital.  Patient/Caregiver Signature _______________________________ Date __________  Clinician Signature _______________________________________ Date __________  Please bring this form and your medication list with you to all your follow-up doctor's appointments.

## 2018-09-05 NOTE — Progress Notes (Addendum)
[facility] PHYSICAL MEDICINE & REHABILITATION PROGRESS NOTE   Subjective/Complaints: Had a fair night. Still no appetite. Didn't eat breakfast. Complains of epigastric abdominal pain  ROS: Patient denies fever, rash, sore throat, blurred vision, nausea, vomiting, diarrhea, cough, shortness of breath or chest pain, joint or back pain, headache, or mood change.    Objective:   No results found. Recent Labs    09/03/18 0318 09/04/18 0325  WBC 9.7 9.4  HGB 7.1* 7.1*  HCT 23.6* 23.7*  PLT 337 330   Recent Labs    09/04/18 0715  NA 135  K 3.6  CL 97*  CO2 24  GLUCOSE 70  BUN 55*  CREATININE 7.38*  CALCIUM 8.0*    Intake/Output Summary (Last 24 hours) at 09/05/2018 1240 Last data filed at 09/05/2018 0930 Gross per 24 hour  Intake 351 ml  Output -  Net 351 ml     Physical Exam: Vital Signs Blood pressure 118/79, pulse (!) 105, temperature 98.1 F (36.7 C), temperature source Oral, resp. rate 18, weight 128.2 kg, SpO2 97 %. Constitutional: No distress . Vital signs reviewed. obese HEENT: EOMI, oral membranes moist Neck: supple Cardiovascular: RRR without murmur. No JVD    Respiratory: CTA Bilaterally without wheezes or rales. Normal effort    GI: BS +,  Mild epigastric tenderness with palpt.  Musculoskeletal:  General: No edema.  Comments: Trace LE edema bilaterally. Neurological: He is alertand oriented to person, place, and time. Nocranial nerve deficit.Coordinationnormal.  UE 5/5. LE: 3/5 HF, 3+ KE and 4/5 ADF/PF bilaterally. No focal sensory findings---motor and sensory unchanged. Skin: Skin iswarmand dry.  Psychiatric: appropriate affect    Assessment/Plan: 1. Functional deficits secondary to debility which require 3+ hours per day of interdisciplinary therapy in a comprehensive inpatient rehab setting.  Physiatrist is providing close team supervision and 24 hour management of active medical problems listed below.  Physiatrist and rehab  team continue to assess barriers to discharge/monitor patient progress toward functional and medical goals  Care Tool:  Bathing    Body parts bathed by patient: Right arm, Left arm, Chest, Abdomen, Front perineal area, Face   Body parts bathed by helper: Buttocks, Right upper leg, Left upper leg, Right lower leg, Left lower leg     Bathing assist Assist Level: Moderate Assistance - Patient 50 - 74%     Upper Body Dressing/Undressing Upper body dressing   What is the patient wearing?: Hospital gown only    Upper body assist Assist Level: Minimal Assistance - Patient > 75%    Lower Body Dressing/Undressing Lower body dressing      What is the patient wearing?: Pants, Incontinence brief     Lower body assist Assist for lower body dressing: Total Assistance - Patient < 25%     Toileting Toileting Toileting Activity did not occur Landscape architect and hygiene only): N/A (no void or bm)(HD pt)  Toileting assist       Transfers Chair/bed transfer  Transfers assist  Chair/bed transfer activity did not occur: Refused  Chair/bed transfer assist level: 2 Helpers     Locomotion Ambulation   Ambulation assist   Ambulation activity did not occur: Safety/medical concerns          Walk 10 feet activity   Assist  Walk 10 feet activity did not occur: Safety/medical concerns        Walk 50 feet activity   Assist Walk 50 feet with 2 turns activity did not occur: Safety/medical concerns  Walk 150 feet activity   Assist Walk 150 feet activity did not occur: Safety/medical concerns         Walk 10 feet on uneven surface  activity   Assist Walk 10 feet on uneven surfaces activity did not occur: Safety/medical concerns         Wheelchair     Assist     Wheelchair activity did not occur: Refused         Wheelchair 50 feet with 2 turns activity    Assist    Wheelchair 50 feet with 2 turns activity did not occur:  Refused       Wheelchair 150 feet activity     Assist Wheelchair 150 feet activity did not occur: Refused        Medical Problem List and Plan: 1.Functional and mobility deficitssecondary to debility after multiple medical issues  -recent hx of bleeding peri-renal cyst complicated elevated INR -beginning therapies today 2. Antithrombotics: -DVT/anticoagulation:Pharmaceutical:Coumadin INR 3.1, adjust per pharm protocol -antiplatelet therapy: N/A 3. Pain Management:PRN oxycodone--may Need to premedicate prior to therapy.  Ice prn to abdomen/flank.  4. Mood:LCSW to follow for evaluation and support. -antipsychotic agents: N/A 5. Neuropsych: This patientiscapable of making decisions on his own behalf. 6. Skin/Wound Care:Routine pressure relief measures. 7. Fluids/Electrolytes/Nutrition:Strict I/O with daily weights. Continue to offer nephro supplements and pro-stat.  -have requested dietitian consult to see if we can work on appetite 8. Anemia of chronic disease: Received Feraheme 5/18 and on Aranesp weekly. . 9. CAD s/p AVR/ICM s/p DTH:YHOOILN HR BID--continue amiodarone daily. On coumadin-- 10. Polycystic kidney disease/ESRD:HD scheduled at end of the day to allow full participation in therapies 11. Chronic hypotension: On midodrine tid--SBP runs in 90's. -watch for worsening after HD     LOS: 1 days A FACE TO [place] 09/05/2018, 12:40 PM

## 2018-09-05 NOTE — Evaluation (Signed)
Occupational Therapy Assessment and Plan  Patient Details  Name: [name] [name] MRN: [id number] Date of Birth: [birth year]  OT Diagnosis: acute pain and muscle weakness (generalized) Rehab Potential: Rehab Potential (ACUTE ONLY): Good ELOS: 10-14 days   Today's Date: 09/05/2018 OT Individual Time: 7001-7494 OT Individual Time Calculation (min): 50 min   Missed 10 due to fatigue   Problem List:  Patient Active Problem List   Diagnosis Date Noted  . Debility 09/04/2018  . Perinephric hematoma 08/20/2018  . Supratherapeutic INR 08/20/2018  . Gross hematuria 08/19/2018  . Acute blood loss anemia 08/02/2018  . Renal hematoma, left, initial encounter 08/01/2018  . 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 ICD (implantable cardioverter-defibrillator) pocket hematoma 01/01/2018  . Encounter for therapeutic drug monitoring 09/14/2017  . S/P aortic valve replacement with bileaflet mechanical valve 09/01/2017  . S/P CABG x 1 09/01/2017  . Coronary artery disease involving native coronary artery of native heart without angina pectoris   . 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 Abnormal echocardiogram   . Mitral regurgitation   . Severe aortic insufficiency   . Acute on chronic combined systolic and diastolic heart failure ([place])   . Systolic CHF ([place]) 49/67/5916  . 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 Dermatitis 04/06/2016  . End stage renal disease ([place]) 12/22/2011  . Sinus tachycardia   . ESRD (end stage renal disease) ([place])   . Orthostatic hypotension   . Chest discomfort   . Morbid obesity ([place])   . Syncope   . Aneurysm [facility])     Past Medical History:  Past Medical History:  Diagnosis Date  . Aneurysm ([place])     Right arm fistula 3 aneurysms 2011,   plans to have a new procedure  . Angina   . Chest discomfort   . CHF (congestive heart failure) ([place])   . Coronary artery disease   . Coronary artery disease involving native  coronary artery of native heart without angina pectoris   . Dialysis patient ([place])   . Dysrhythmia   . ESRD (end stage renal disease) ([place])    on hemodialysis T_T_S  . Hypertension   . Leg pain   . Mitral regurgitation   . Morbid obesity ([place])   . Orthostatic hypotension   . Overweight(278.02)   . Renal insufficiency   . 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 Severe aortic insufficiency   . Sinus tachycardia   . Syncope     positional after dialysis... 2008   Past Surgical History:  Past Surgical History:  Procedure Laterality Date  . A/V FISTULAGRAM Left 06/30/2016   Procedure: A/V Fistulagram;  Surgeon: Algernon Huxley, MD;  Location: [place] [facility];  Service: Cardiovascular;  Laterality: Left;  . AORTIC VALVE REPLACEMENT N/A 09/01/2017   Procedure: AORTIC VALVE REPLACEMENT (AVR);  Surgeon: [name], MD;  Location: [place];  Service: Open Heart Surgery;  Laterality: N/A;  . ARTERIOVENOUS GRAFT PLACEMENT    . AV FISTULA PLACEMENT    . AV FISTULA PLACEMENT Left 11/18/2015   Procedure: ARTERIOVENOUS (AV) FISTULA CREATION ( RADIOCEPHALIC );  Surgeon: Algernon Huxley, MD;  Location: [facility];  Service: Vascular;  Laterality: Left;  . AV FISTULA PLACEMENT Left 03/23/2016   Procedure: ARTERIOVENOUS (AV) FISTULA CREATION ( REVISION );  Surgeon: Algernon Huxley, MD;  Location: [facility];  Service: Vascular;  Laterality: Left;  . [place] REMOVAL Left 03/23/2016   Procedure: REMOVAL OF ARTERIOVENOUS GORETEX GRAFT ([place]);  Surgeon: Algernon Huxley, MD;  Location: [facility];  Service: Vascular;  Laterality: Left;  . CENTRAL LINE INSERTION Right 09/01/2017   Procedure: FLOROSCOPY GUIDED PLACEMENT OF RIGHT FEMORAL CENTRAL LINE TIMES TWO  AND PLACEMENT OF SWAN GANZ CATHETER;  Surgeon: [name], MD;  Location: [place];  Service: Open Heart Surgery;  Laterality: Right;  .  CORONARY ARTERY BYPASS GRAFT N/A 09/01/2017   Procedure: CORONARY ARTERY BYPASS GRAFTING (CABG) X 1, USING RIGHT GREATER SAPHENOUS VEIN HARVESTED ENDOSCOPICALLY;  Surgeon: [name], MD;  Location: [place];  Service: Open Heart Surgery;  Laterality: N/A;  . DIALYSIS/PERMA CATHETER REMOVAL N/A 08/01/2016   Procedure: Dialysis/Perma Catheter Removal;  Surgeon: Algernon Huxley, MD;  Location: [place] [facility];  Service: Cardiovascular;  Laterality: N/A;  . HERNIA REPAIR    . INSERTION OF DIALYSIS CATHETER  03/01/2011   Procedure: INSERTION OF DIALYSIS CATHETER;  Surgeon: Elam Dutch, MD;  Location: [place];  Service: Vascular;  Laterality: Left;  Exchange of Dialysis Catheter to 27cm 15Fr. Arrow Catheter  . INSERTION OF DIALYSIS CATHETER  09/01/2017   Procedure: PLACEMENT OF TRIALYSIS SHORT TERM DIALYSIS CATHETER;  Surgeon: [name], MD;  Location: [place];  Service: Open Heart Surgery;;  . IR EMBO ART  VEN HEMORR LYMPH EXTRAV  INC GUIDE ROADMAPPING  08/29/2018  . IR FLUORO GUIDE CV LINE RIGHT  08/27/2018  . IR RENAL SUPRASEL UNI S&I MOD SED  08/29/2018  . IR US GUIDE VASC ACCESS RIGHT  08/27/2018  . IR US GUIDE VASC ACCESS RIGHT  08/29/2018  . MULTIPLE EXTRACTIONS WITH ALVEOLOPLASTY N/A 08/07/2017   Procedure: Extraction of tooth #2, 7,8,13,14,15,17, and 31 with alveoloplasty and gross debridement of remaining teeth;  Surgeon: [name], DDS;  Location: [place];  Service: Oral Surgery;  Laterality: N/A;  . PERIPHERAL VASCULAR CATHETERIZATION N/A 09/01/2014   Procedure: A/V Shuntogram/Fistulagram;  Surgeon: Algernon Huxley, MD;  Location: [place] [facility];  Service: Cardiovascular;  Laterality: N/A;  . PERIPHERAL VASCULAR CATHETERIZATION Right 09/01/2014   Procedure: Thrombectomy;  Surgeon: Algernon Huxley, MD;  Location: [place] [facility];  Service: Cardiovascular;  Laterality: Right;  . PERIPHERAL VASCULAR CATHETERIZATION Right 09/01/2014   Procedure: A/V Shunt Intervention;  Surgeon:  Algernon Huxley, MD;  Location: [place] [facility];  Service: Cardiovascular;  Laterality: Right;  . PERIPHERAL VASCULAR CATHETERIZATION N/A 09/17/2014   Procedure: A/V Shuntogram/Fistulagram;  Surgeon: Algernon Huxley, MD;  Location: [place] [facility];  Service: Cardiovascular;  Laterality: N/A;  . PERIPHERAL VASCULAR CATHETERIZATION Right 10/17/2014   Procedure: Thrombectomy;  Surgeon: Katha Cabal, MD;  Location: [place] [facility];  Service: Cardiovascular;  Laterality: Right;  . PERIPHERAL VASCULAR CATHETERIZATION N/A 10/17/2014   Procedure: A/V Shuntogram/Fistulagram;  Surgeon: Katha Cabal, MD;  Location: [place] [facility];  Service: Cardiovascular;  Laterality: N/A;  . PERIPHERAL VASCULAR CATHETERIZATION N/A 10/17/2014   Procedure: A/V Shunt Intervention;  Surgeon: Katha Cabal, MD;  Location: [facility];  Service: Cardiovascular;  Laterality: N/A;  . PERIPHERAL VASCULAR CATHETERIZATION Right 11/04/2014   Procedure: Thrombectomy;  Surgeon: Katha Cabal, MD;  Location: [place] [facility];  Service: Cardiovascular;  Laterality: Right;  . PERIPHERAL VASCULAR CATHETERIZATION Left 11/04/2014   Procedure: Visceral Venography;  Surgeon: Katha Cabal, MD;  Location:  [place] [facility];  Service: Cardiovascular;  Laterality: Left;  . PERIPHERAL VASCULAR CATHETERIZATION Right 11/27/2014   Procedure: A/V Shuntogram/Fistulagram;  Surgeon: Algernon Huxley, MD;  Location: [place] [facility];  Service: Cardiovascular;  Laterality: Right;  . PERIPHERAL VASCULAR CATHETERIZATION Right 11/27/2014   Procedure: A/V Shunt Intervention;  Surgeon: Algernon Huxley, MD;  Location: [place] [facility];  Service: Cardiovascular;  Laterality: Right;  . PERIPHERAL VASCULAR CATHETERIZATION Right 12/31/2014   Procedure: Thrombectomy;  Surgeon: Algernon Huxley, MD;  Location: [place] [facility];  Service: Cardiovascular;  Laterality: Right;  . PERIPHERAL VASCULAR CATHETERIZATION Right 01/12/2015    Procedure: A/V Shuntogram/Fistulagram;  Surgeon: Algernon Huxley, MD;  Location: [place] [facility];  Service: Cardiovascular;  Laterality: Right;  . PERIPHERAL VASCULAR CATHETERIZATION N/A 01/12/2015   Procedure: A/V Shunt Intervention;  Surgeon: Algernon Huxley, MD;  Location: [place] [facility];  Service: Cardiovascular;  Laterality: N/A;  . PERIPHERAL VASCULAR CATHETERIZATION Right 01/28/2015   Procedure: Thrombectomy;  Surgeon: Algernon Huxley, MD;  Location: [place] [facility];  Service: Cardiovascular;  Laterality: Right;  . PERIPHERAL VASCULAR CATHETERIZATION N/A 06/08/2015   Procedure: A/V Shuntogram/Fistulagram;  Surgeon: Algernon Huxley, MD;  Location: [place] [facility];  Service: Cardiovascular;  Laterality: N/A;  . PERIPHERAL VASCULAR CATHETERIZATION N/A 06/08/2015   Procedure: A/V Shunt Intervention;  Surgeon: Algernon Huxley, MD;  Location: [place] [facility];  Service: Cardiovascular;  Laterality: N/A;  . PERIPHERAL VASCULAR CATHETERIZATION Right 07/06/2015   Procedure: A/V Shuntogram/Fistulagram;  Surgeon: Algernon Huxley, MD;  Location: [place] [facility];  Service: Cardiovascular;  Laterality: Right;  . PERIPHERAL VASCULAR CATHETERIZATION N/A 07/06/2015   Procedure: A/V Shunt Intervention;  Surgeon: Algernon Huxley, MD;  Location: [place] [facility];  Service: Cardiovascular;  Laterality: N/A;  . PERIPHERAL VASCULAR CATHETERIZATION N/A 08/04/2015   Procedure: graft declot;  Surgeon: Katha Cabal, MD;  Location: [place] [facility];  Service: Cardiovascular;  Laterality: N/A;  . PERIPHERAL VASCULAR CATHETERIZATION Right 08/25/2015   Procedure: A/V Shuntogram/Fistulagram;  Surgeon: Katha Cabal, MD;  Location: [place] [facility];  Service: Cardiovascular;  Laterality: Right;  . PERIPHERAL VASCULAR CATHETERIZATION N/A 11/04/2015   Procedure: Dialysis/Perma Catheter Insertion;  Surgeon: Algernon Huxley, MD;  Location: [place] [facility];  Service: Cardiovascular;  Laterality: N/A;  .  PERIPHERAL VASCULAR CATHETERIZATION Left 12/14/2015   Procedure: A/V Shuntogram/Fistulagram;  Surgeon: Algernon Huxley, MD;  Location: [place] [facility];  Service: Cardiovascular;  Laterality: Left;  . PERIPHERAL VASCULAR CATHETERIZATION N/A 12/14/2015   Procedure: A/V Shunt Intervention;  Surgeon: Algernon Huxley, MD;  Location: [place] [facility];  Service: Cardiovascular;  Laterality: N/A;  . PERIPHERAL VASCULAR CATHETERIZATION N/A 12/17/2015   Procedure: Dialysis/Perma Catheter Insertion;  Surgeon: Algernon Huxley, MD;  Location: [place] [facility];  Service: Cardiovascular;  Laterality: N/A;  . PERIPHERAL VASCULAR CATHETERIZATION Left 12/22/2015   Procedure: Dialysis/Perma Catheter Insertion;  Surgeon: Katha Cabal, MD;  Location: [place] [facility];  Service: Cardiovascular;  Laterality: Left;  . PERIPHERAL VASCULAR CATHETERIZATION Left 02/29/2016   Procedure: A/V Shuntogram/Fistulagram;  Surgeon: Algernon Huxley, MD;  Location: [place] [facility];  Service: Cardiovascular;  Laterality: Left;  . PERIPHERAL VASCULAR CATHETERIZATION N/A 02/29/2016   Procedure: A/V Shunt Intervention;  Surgeon: Algernon Huxley, MD;  Location: [place] [facility];  Service: Cardiovascular;  Laterality: N/A;  . POCKET REVISION/RELOCATION N/A 01/01/2018   Procedure: POCKET REVISION/RELOCATION;  Surgeon: Evans Lance, MD;  Location: [place] [facility];  Service: Cardiovascular;  Laterality: N/A;  . right arm graft     for dyalisis  . RIGHT/LEFT HEART CATH AND CORONARY ANGIOGRAPHY N/A 08/02/2017   Procedure: RIGHT/LEFT HEART CATH AND CORONARY ANGIOGRAPHY;  Surgeon: Larey Dresser, MD;  Location: [place] [facility];  Service: Cardiovascular;  Laterality: N/A;  . SUBQ ICD IMPLANT N/A 11/28/2017   Procedure: SUBQ ICD IMPLANT;  Surgeon: Evans Lance, MD;  Location: [place] [facility];  Service: Cardiovascular;  Laterality: N/A;  . SUBQ ICD REVISION N/A 02/12/2018   Procedure: SUBQ ICD REVISION(REMOVAL);  Surgeon: Evans Lance, MD;  Location: [place] [facility];  Service: Cardiovascular;  Laterality: N/A;  . TEE WITHOUT CARDIOVERSION N/A 07/31/2017   Procedure: TRANSESOPHAGEAL ECHOCARDIOGRAM (TEE);  Surgeon: Josue Hector, MD;  Location: [facility];  Service: Cardiovascular;  Laterality: N/A;  . TEE WITHOUT CARDIOVERSION N/A 09/01/2017   Procedure: TRANSESOPHAGEAL ECHOCARDIOGRAM (TEE);  Surgeon: [name], MD;  Location: [place];  Service: Open Heart Surgery;  Laterality: N/A;  . THROMBECTOMY    . THROMBECTOMY W/ EMBOLECTOMY  03/01/2011   Procedure: THROMBECTOMY ARTERIOVENOUS GORE-TEX GRAFT;  Surgeon: Elam Dutch, MD;  Location: [place];  Service: Vascular;  Laterality: Right;  Attempted Thrombectomy of Old  Right Upper Arm Arteriovenous gortex Graft. Insertion of new Arteriovenous Graft using 80m x 50cm Gortex Stretch graft.   . THROMBECTOMY W/ EMBOLECTOMY  07/11/2011   Procedure: THROMBECTOMY ARTERIOVENOUS GORE-TEX GRAFT;  Surgeon: TRosetta Posner MD;  [place]  Service: Vascular;  Laterality: Right;  . UMBILICAL HERNIA REPAIR    . VENOGRAM N/A 08/23/2011   Procedure: VENOGRAM;  Surgeon: VSerafina Mitchell MD;  [facility];  Service: Cardiovascular;  Laterality: N/A;    Assessment & Plan Clinical Impression: Patient is a 53y.o. year old male with  history of ESRD due to polycystic kidney disease-- HD TTS, HTN, CAD/AS s/p CABG with mechanical AVR on chronic coumadin recent admission 4/15-4/21 forsupratherapeutic INR andruptured hemorrhagic renal cyst. He was readmitted on 08/19/2018 with tachycardia, fall a few days prior to admission with difficulty walking and inability to get to HD, LUQ and flank pain radiating to back with hematuria.CT abdomen at OSH showed increase in size of perinephric hematoma and INR noted to be supratherapeutic at 5.5. INR was reversed and he was transfused with 1 units packed red blood cells for hemoglobin of 7.6. Urology consulted and recommended supportive  care with bedrest and serial H&H tillhemoglobin stable.   He continued to have ongoing blood loss with pain and required 5 total units of PRBC.Dr. BAlinda Moneyrecommended consulting radiology consulted for assistance and patient underwent left renal embolization by Dr. WPascal Luxon05/13. Coumadin resumed andINR trending back upwards. He has been weaned off PCA and physical therapy initiated however patient has been unable to tolerate standing due to BLE weakness or sitting up in a chair due to severe back/flankpain. CIR was recommended due to functional decline  Patient transferred to CIR on 09/04/2018 .    Patient currently requires max with basic self-care skills and basic mobility with max to max +2 secondary to muscle weakness and acute pain, decreased cardiorespiratoy endurance and decreased standing balance, decreased balance strategies and difficulty tolerating BP changes with positional changes.  Prior to hospitalization, patient could complete ADLs with independent .  Patient will benefit from skilled intervention to decrease level of assist with basic self-care skills and  increase independence with basic self-care skills prior to discharge home with care partner.  Anticipate patient will require intermittent supervision and follow up home health.  OT - End of Session Activity Tolerance: Tolerates 30+ min activity with multiple rests Endurance Deficit: Yes Endurance Deficit Description: activity limited by pain and dizziness with upright posture OT Assessment Rehab Potential (ACUTE ONLY): Good OT Patient demonstrates impairments in the following area(s): Balance;Endurance;Pain;Skin Integrity OT Basic ADL's Functional Problem(s): Bathing;Dressing;Toileting OT Transfers Functional Problem(s): Toilet;Tub/Shower OT Additional Impairment(s): None OT Plan OT Intensity: Minimum of 1-2 x/day, 45 to 90 minutes OT Frequency: 5 out of 7 days OT Duration/Estimated Length of Stay: 10-14 days OT  Treatment/Interventions: Balance/vestibular training;Discharge planning;Pain management;Self Care/advanced ADL retraining;Therapeutic Activities;UE/LE Coordination activities;Functional mobility training;Patient/family education;Skin care/wound managment;Community reintegration;DME/adaptive equipment instruction;Psychosocial support;UE/LE Strength taining/ROM OT Self Feeding Anticipated Outcome(s): n/a OT Basic Self-Care Anticipated Outcome(s): setup  OT Toileting Anticipated Outcome(s): setup  OT Bathroom Transfers Anticipated Outcome(s): setup OT Recommendation Patient destination: Home Follow Up Recommendations: Home health OT Equipment Recommended: To be determined   Skilled Therapeutic Intervention Ot eval initiated with OT purpose, role and  Goals discussed with pt. Pt reported having difficulty tolerating sitting  EOB with PT earlier this morning; feeling lightheaded and dizzy. Pt had already donned TEDS to assist with BP.  Pt agreed to again try mobility. Perform UB bathing in bed with EOB elevated with setup. LB bathing at bed level total A. Donned hospital gown with min A and total A +2 rolling in bed to don new brief and paper pants (no clean clothes present). Pt able to come to EOB with mod A with +2 for safety. Pt able to perform scoot/squat pivot bed to tilt in space w/c with mod A +2. Pt able to tolerate sitting in tilt in space w/c for ~ 1 hr with tilt.  OT did return to A pt back to bed with mod A +2 scoot pivot back into bed with mod A to A LEs back into bed.    OT Evaluation Precautions/Restrictions  Precautions Precautions: Fall Precaution Comments: pain along flanks and abdomen; ice as needed Restrictions Weight Bearing Restrictions: No General Chart Reviewed: Yes OT Amount of Missed Time: 10 Minutes PT Missed Treatment Reason: Other (Comment)(dizziness) Family/Caregiver Present: No Vital Signs Therapy Vitals Pulse Rate: (!) 105 BP: 118/79 Patient Position (if  appropriate): Sitting Pain Pain Assessment Pain Scale: 0-10 Pain Score: 9  Pain Type: Acute pain Pain Location: Back Pain Descriptors / Indicators: Jabbing;Shooting;Sharp Pain Frequency: Intermittent Pain Onset: On-going Pain Intervention(s): Repositioned;Relaxation Home Living/Prior Functioning Home Living Available Help at Discharge: Family, Available 24 hours/day Type of Home: House Home Access: Stairs to enter Technical brewer of Steps: 6 Entrance Stairs-Rails: Can reach both Home Layout: One level Bathroom Shower/Tub: Chiropodist: Standard  Lives With: Spouse Prior Function Level of Independence: Independent with basic ADLs, Independent with homemaking with wheelchair, Independent with homemaking with ambulation, Independent with gait  Able to Take Stairs?: Yes Driving: Yes(drives himself to/from dialysis, wife drives sometimes) Vocation: Retired Comments: reports falls at home recently, reports that it was related to "blacking out" and not balance issues ADL ADL Eating: Independent Where Assessed-Eating: Edge of bed Grooming: Setup Where Assessed-Grooming: Sitting at sink Upper Body Bathing: Setup Where Assessed-Upper Body Bathing: Bed level Lower Body Bathing: Maximal assistance Where Assessed-Lower Body Bathing: Bed level Upper Body Dressing: (hospital gown - min A) Where Assessed-Upper Body Dressing: Chair Lower Body Dressing: Maximal assistance(+2) Where Assessed-Lower Body Dressing: Bed level Toileting:  Not assessed Toilet Transfer: Not assessed Tub/Shower Transfer: Not assessed Social research officer, government: Not assessed Vision Baseline Vision/History: No visual deficits Patient Visual Report: No change from baseline Vision Assessment?: No apparent visual deficits Perception  Perception: Within Functional Limits Praxis Praxis: Intact Cognition Overall Cognitive Status: Within Functional Limits for tasks  assessed Arousal/Alertness: Awake/alert Orientation Level: Person;Place;Situation Person: Oriented Place: Oriented Situation: Oriented Year: 2020 Month: May Day of Week: Correct Memory: Appears intact Immediate Memory Recall: Sock;Blue;Bed Memory Recall: Sock;Blue;Bed Memory Recall Sock: Without Cue Memory Recall Blue: Without Cue Memory Recall Bed: Without Cue Awareness: Appears intact Problem Solving: Appears intact Safety/Judgment: Appears intact Sensation Sensation Light Touch: Appears Intact Proprioception: Appears Intact Stereognosis: Appears Intact Coordination Gross Motor Movements are Fluid and Coordinated: No Fine Motor Movements are Fluid and Coordinated: Yes Heel Shin Test: impaired 2/2 weakness Motor  Motor Motor: Within Functional Limits Motor - Skilled Clinical Observations: generalized weakness and limited by pain Mobility  Bed Mobility Bed Mobility: Rolling Right;Rolling Left;Supine to Sit;Sit to Supine Rolling Right: Minimal Assistance - Patient > 75% Rolling Left: Minimal Assistance - Patient > 75% Supine to Sit: Minimal Assistance - Patient > 75% Sit to Supine: Minimal Assistance - Patient > 75%  Trunk/Postural Assessment  Cervical Assessment Cervical Assessment: Within Functional Limits Thoracic Assessment Thoracic Assessment: Within Functional Limits Lumbar Assessment Lumbar Assessment: Within Functional Limits Postural Control Postural Control: Within Functional Limits  Balance Balance Balance Assessed: Yes Static Sitting Balance Static Sitting - Balance Support: Feet supported;No upper extremity supported Static Sitting - Level of Assistance: 5: Stand by assistance Dynamic Sitting Balance Dynamic Sitting - Balance Support: Feet supported;No upper extremity supported Dynamic Sitting - Level of Assistance: 5: Stand by assistance Extremity/Trunk Assessment RUE Assessment RUE Assessment: Within Functional Limits LUE Assessment LUE  Assessment: Within Functional Limits     Refer to Care Plan for Long Term Goals  Recommendations for other services: None    Discharge Criteria: Patient will be discharged from OT if patient refuses treatment 3 consecutive times without medical reason, if treatment goals not met, if there is a change in medical status, if patient makes no progress towards goals or if patient is discharged from hospital.  The above assessment, treatment plan, treatment alternatives and goals were discussed and mutually agreed upon: by patient  Nicoletta Ba 09/05/2018, 10:46 AM

## 2018-09-05 NOTE — Progress Notes (Signed)
Occupational Therapy Session Note  Patient Details  Name: [name] [name] MRN: [id number] Date of Birth: Aug 24, 1965  Today's Date: 09/05/2018 OT Individual Time: 1230-1305 OT Individual Time Calculation (min): 35 min  and Today's Date: 09/05/2018 OT Missed Time: 10 Minutes Missed Time Reason: Pain;Patient fatigue   Short Term Goals: Week 1:     Skilled Therapeutic Interventions/Progress Updates:  OT intervention initiated after consultation re POC with evaluating OTR. Intervention with focus on bed mobility, review of goals, role of OT, encouragement to participate.  Attempted to engaged pt in bed mobility to sitting EOB. Pt with increased anxiety regarding sitting EOB 2/2 prior experience with hypotension and "almost passing out." Encouraged pt to attempt sitting EOB with therapist's assistance.  Pt continued to decline.  Pt agreeable to raising HOB to approx 45 degrees.  Pt with 10/10 pain (see below); RN admin meds during session.  Pt not eating meals. Encouraged pt to maintain nutrition.  Pt agreeable to trying some yogurt and ate apprx 3 oz of vanilla yogurt. Discussed purpose of CIR and encouraged pt to participate in following day's therapies. Pt stated he would give it a try.   Therapy Documentation Precautions:  Precautions Precautions: Fall Precaution Comments: pain along flanks and abdomen; ice as needed Restrictions Weight Bearing Restrictions: No General: OT Amount of Missed Time: 10 Minutes  Pain: Pain Assessment Pain Scale: 0-10 Pain Score: 10-Worst pain ever Pain Type: Acute pain Pain Location: Flank Pain Orientation: Left Pain Descriptors / Indicators: Jabbing Pain Frequency: Intermittent Pain Onset: With Activity Pain Intervention(s): Medication (See eMAR)   Therapy/Group: Individual Therapy  [name] 09/05/2018, 1:14 PM

## 2018-09-05 NOTE — Progress Notes (Signed)
Initial Nutrition Assessment  RD working remotely.  DOCUMENTATION CODES:   Obesity unspecified, suspect some degree of acute malnutrition but unable to confirm without NFPE  INTERVENTION:   Pt with very poor PO intake (0% at most meals) during acute admission prior to admission to CIR. Today marks day 16 with poor intake. Recommend nutrition support.  - Continue Nepro Shake po BID, each supplement provides 425 kcal and 19 grams protein  - Continue Pro-stat 30 ml TID, each supplement provides 100 kcal and 15 grams of protein  - Continue renal MVI daily  - Add Magic cup BID with meals, each supplement provides 290 kcal and 9 grams of protein  - Add double protein portions TID with all meals  NUTRITION DIAGNOSIS:   Inadequate oral intake related to poor appetite as evidenced by meal completion < 25%.  GOAL:   Patient will meet greater than or equal to 90% of their needs  MONITOR:   PO intake, Supplement acceptance, Labs, Weight trends, I & O's, Skin  REASON FOR ASSESSMENT:   Malnutrition Screening Tool, Consult Poor PO  ASSESSMENT:   53 year old male with PMH of ESRD due to polycystic kidney disease on HD TTS, HTN, CAD/AS s/p CABG with mechanical AVR on chronic coumadin. Pt with recent admission 4/15-4/21 forsupratherapeutic INR andruptured hemorrhagic renal cyst. Pt was readmitted on 08/19/18 with tachycardia, fall a few days PTA with difficulty walking and inability to get to HD, LUQ and flank pain radiating to back with hematuria.CT abdomen at OSH showed increase in size of perinephric hematoma and INR noted to be supratherapeutic. Pt underwent left renal embolization on 5/13. Pt admitted to CIR on 5/19.  Next HD scheduled for 09/06/18. EDW: 133.5 kg. Current weight is below EDW.  Reviewed weight history in chart. Pt with steady decline in weight starting in January 2020. Since that time, pt with 9.2 kg of weight loss total. This is a 6.7% weight loss in 4 months which  is not quite significant for timeframe.  Reviewed RD notes from acute admission. Pt was eating 0% at most meals. Pt diagnosed by RD with moderate malnutrition in the context of acute illness on 5/15. Suspect malnutrition persists but unable to confirm without NFPE. Will attempt to complete NFPE at follow-up.  Pt with very poor PO intake (0% at most meals) during acute admission prior to admission to CIR per RD notes from that admission. Today marks day 16 with poor intake. Recommend nutrition support.  Recommend placement of NGT and initiation of Nepro @ 55 ml/hr x 20 hours to provide 1980 kcal (82% of minimum kcal needs), 89 grams of protein (74% of minimum protein needs), and 800 ml free water.  Communicated recommendations with MD. Have not yet heard back.  Unable to reach pt via phone call to room despite multiple attempts.  Meal Completion: 10-20% x 2 meals  Medications reviewed and include: Nepro BID, Pro-stat 30 ml TID, Fosrenol TID, rena-vit, warfarin  Labs reviewed: hemoglobin 7.1 (L)  Diet Order:   Diet Order            Diet renal with fluid restriction Fluid restriction: 1200 mL Fluid; Room service appropriate? Yes; Fluid consistency: Thin  Diet effective now              EDUCATION NEEDS:   Not appropriate for education at this time  Skin:  Skin Assessment: Skin Integrity Issues: Incisions: right groin, chest Other: MAST to scrotum and buttocks  Last BM:  09/04/18  Height:   Ht Readings from Last 1 Encounters:  08/21/18 6\' 1"  (1.854 m)    Weight:   Wt Readings from Last 1 Encounters:  09/05/18 128.2 kg    Ideal Body Weight:  83.6 kg  BMI:  Body mass index is 37.29 kg/m.  Estimated Nutritional Needs:   Kcal:  2400-2600  Protein:  120-135 grams  Fluid:  UOP + 1000 ml    Gaynell Face, MS, RD, LDN Inpatient Clinical Dietitian Pager: [phone] Weekend/After Hours: [phone]

## 2018-09-05 NOTE — Progress Notes (Signed)
[facility] ROUNDING NOTE   Subjective:   This is a 53 year old gentleman end-stage renal disease secondary to polycystic kidney disease, currently on dialysis Tuesday Thursday Saturday at [facility] clinic followed by Dr. Lowanda Foster.  He has a history of chronic hypotension and continues on Midodrin.  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  He has congestive heart failure with systolic dysfunction with an ejection fraction about 30% per 2D echo 10/25/2017. He presented with a left-sided perinephric hematoma in the setting of a supratherapeutic INR required a total of 7 units packed red blood cells and transcatheter embolization of left kidney 08/29/2018.  Patient underwent successful dialysis 09/04/2018 scheduled for his next dialysis treatment 09/06/2018  Blood pressure 118/79 pulse 105  Sodium 135 potassium 3.6 chloride 97 CO2 24 BUN 55 creatinine 7.38 glucose 70 hemoglobin 7.1 WBC 9.4 platelets 230 INR 3.1   Amiodarone 200 mg daily atorvastatin 80 mg daily , Proamatidine 15 mg 3 times daily, Fosrenol 1 g with meals 500 mg with snacks, Aranesp 100 mcg q. Tuesday, Coumadin per INR, Feraheme 510 mg x 1 09/03/2018    Objective:  Vital signs in last 24 hours:  Temp:  [97.3 F (36.3 C)-98.1 F (36.7 C)] 98.1 F (36.7 C) (05/19 1559) Pulse Rate:  [95-105] 105 (05/20 0812) Resp:  [16-18] 18 (05/20 0604) BP: (86-118)/(55-79) 118/79 (05/20 0812) SpO2:  [95 %-98 %] 97 % (05/20 0604) Weight:  [128.4 kg] 128.4 kg (05/19 1105)  Weight change:  There were no vitals filed for this visit.  Intake/Output: I/O last 3 completed shifts: In: 111 [P.O.:111] Out: -    Intake/Output this shift:  No intake/output data recorded.  Alert pleasant gentleman obese nondistressed CVS- RRR no elevated JVP prosthetic heart sound RS- CTA ABD- BS present soft non-distended EXT- no edema left AV fistula with thrill and  bruit   Basic Metabolic Panel: Recent Labs  Lab 08/30/18 0501 09/01/18 0737 09/04/18 0715  NA 134* 134* 135  K 4.0 3.8 3.6  CL 95* 93* 97*  CO2 26 25 24   GLUCOSE 62* 68* 70  BUN 51* 40* 55*  CREATININE 7.06* 6.22* 7.38*  CALCIUM 8.1* 8.2* 8.0*  PHOS 5.5* 4.4 5.2*    Liver Function Tests: Recent Labs  Lab 08/30/18 0501 09/01/18 0737 09/04/18 0715  ALBUMIN 1.4* 1.4* 1.4*   No results for input(s): LIPASE, AMYLASE in the last 168 hours. No results for input(s): AMMONIA in the last 168 hours.  CBC: Recent Labs  Lab 09/01/18 0359 09/01/18 0737 09/02/18 0323 09/03/18 0318 09/04/18 0325  WBC 14.2* 13.2* 10.3 9.7 9.4  HGB 7.2* 7.3* 7.0* 7.1* 7.1*  HCT 23.5* 24.0* 23.0* 23.6* 23.7*  MCV 87.4 86.6 87.5 87.7 87.1  PLT 342 329 319 337 330    Cardiac Enzymes: No results for input(s): CKTOTAL, CKMB, CKMBINDEX, TROPONINI in the last 168 hours.  BNP: Invalid input(s): POCBNP  CBG: No results for input(s): GLUCAP in the last 168 hours.  Microbiology: Results for orders placed or performed during the hospital encounter of 08/19/18  SARS Coronavirus 2 (CEPHEID - Performed in [facility] hospital lab), Hosp Order     Status: None   Collection Time: 08/19/18  5:11 PM  Result Value Ref Range Status   SARS Coronavirus 2 NEGATIVE NEGATIVE Final    Comment: (NOTE) If result is NEGATIVE SARS-CoV-2 target nucleic acids are NOT DETECTED. The SARS-CoV-2 RNA is generally detectable in  upper and lower  respiratory specimens during the acute phase of infection. The lowest  concentration of SARS-CoV-2 viral copies this assay can detect is 250  copies / mL. A negative result does not preclude SARS-CoV-2 infection  and should not be used as the sole basis for treatment or other  patient management decisions.  A negative result may occur with  improper specimen collection / handling, submission of specimen other  than nasopharyngeal swab, presence of viral mutation(s) within the   areas targeted by this assay, and inadequate number of viral copies  (<250 copies / mL). A negative result must be combined with clinical  observations, patient history, and epidemiological information. If result is POSITIVE SARS-CoV-2 target nucleic acids are DETECTED. The SARS-CoV-2 RNA is generally detectable in upper and lower  respiratory specimens dur ing the acute phase of infection.  Positive  results are indicative of active infection with SARS-CoV-2.  Clinical  correlation with patient history and other diagnostic information is  necessary to determine patient infection status.  Positive results do  not rule out bacterial infection or co-infection with other viruses. If result is PRESUMPTIVE POSTIVE SARS-CoV-2 nucleic acids MAY BE PRESENT.   A presumptive positive result was obtained on the submitted specimen  and confirmed on repeat testing.  While 2019 novel coronavirus  (SARS-CoV-2) nucleic acids may be present in the submitted sample  additional confirmatory testing may be necessary for epidemiological  and / or clinical management purposes  to differentiate between  SARS-CoV-2 and other Sarbecovirus currently known to infect humans.  If clinically indicated additional testing with an alternate test  methodology [phone]) is advised. The SARS-CoV-2 RNA is generally  detectable in upper and lower respiratory sp ecimens during the acute  phase of infection. The expected result is Negative. Fact Sheet for Patients:  StrictlyIdeas.no Fact Sheet for Healthcare Providers: BankingDealers.co.za This test is not yet approved or cleared by the [place] FDA and has been authorized for detection and/or diagnosis of SARS-CoV-2 by FDA under an Emergency Use Authorization (EUA).  This EUA will remain in effect (meaning this test can be used) for the duration of the COVID-19 declaration under Section 564(b)(1) of the Act, 21  U.S.C. section 360bbb-3(b)(1), unless the authorization is terminated or revoked sooner. Performed at [facility], [facility] [address], [place], [place] [address]     Coagulation Studies: Recent Labs    09/03/18 0318 09/04/18 0325 09/05/18 0613  LABPROT 26.8* 30.4* 31.2*  INR 2.5* 3.0* 3.1*    Urinalysis: No results for input(s): COLORURINE, LABSPEC, PHURINE, GLUCOSEU, HGBUR, BILIRUBINUR, KETONESUR, PROTEINUR, UROBILINOGEN, NITRITE, LEUKOCYTESUR in the last 72 hours.  Invalid input(s): APPERANCEUR    Imaging: No results found.   Medications:    . acetaminophen  650 mg Oral Q6H  . amiodarone  200 mg Oral Daily  . atorvastatin  80 mg Oral q1800  . Chlorhexidine Gluconate Cloth  6 each Topical Q0600  . [START ON 09/11/2018] darbepoetin (ARANESP) injection - DIALYSIS  100 mcg Intravenous Q Tue-HD  . feeding supplement (NEPRO CARB STEADY)  237 mL Oral BID BM  . feeding supplement (PRO-STAT SUGAR FREE 64)  30 mL Oral TID  . lanthanum  1,000 mg Oral TID PC  . midodrine  15 mg Oral TID WC  . multivitamin  1 tablet Oral QHS  . Warfarin - Pharmacist Dosing Inpatient   Does not apply q1800   acetaminophen, alum & mag hydroxide-simeth, bisacodyl, diphenhydrAMINE, guaiFENesin-dextromethorphan, lanthanum, oxyCODONE, polyethylene glycol, prochlorperazine **OR**  prochlorperazine **OR** prochlorperazine, sodium chloride flush, traMADol, traZODone  Assessment/ Plan:   ESRD-Tuesday Thursday dialysis currently dialyzes at [facility] unit.  He was admitted to [facility] for [facility] after prolonged hospitalization.  He will receive dialysis 09/06/2018  ANEMIA-patient has received IV iron and darbepoetin.  Acute blood loss anemia secondary to perinephric hematoma.  Status post embolization.  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  Continues on binders that are non-calcium based  HTN/VOL-congestive heart failure with ejection fraction 30% midodrine 15 mg 3 times  daily for blood pressure support.  About 1 L of fluid removed 09/01/2018  Coronary artery disease status status post CABG 2019 history of systolic heart failure.  Hyperlipidemia on statins  AVR with admission INR supratherapeutic retroperitoneal bleed  Recurrent left perinephric hematoma with supratherapeutic INR status post ablation  Atrial fibrillation/AVR continues on amiodarone and anticoagulation.  INR being followed closely   LOS: [place] @TODAY @9 :82 AM

## 2018-09-05 NOTE — Progress Notes (Signed)
[place] for warfarin Indication: Mechanical valve  No Known Allergies  Patient Measurements: Weight: 282 lb 10.1 oz (128.2 kg)  Vital Signs: Temp: 97.6 F (36.4 C) (05/20 1333) Temp Source: Oral (05/20 1333) BP: 91/60 (05/20 1333) Pulse Rate: 85 (05/20 1333)  Labs: Recent Labs    09/03/18 0318 09/04/18 0325 09/04/18 0715 09/05/18 0613  HGB 7.1* 7.1*  --   --   HCT 23.6* 23.7*  --   --   PLT 337 330  --   --   LABPROT 26.8* 30.4*  --  31.2*  INR 2.5* 3.0*  --  3.1*  CREATININE  --   --  7.38*  --     Estimated Creatinine Clearance: 16.2 mL/min (A) (by C-G formula based on SCr of 7.38 mg/dL (H)).  Assessment: 80 YOM on warfarin PTA for mech aortic valve. Recent admission 4/15-4/21 forsupratherapeutic INR andruptured hemorrhagic renal cyst. Readmitted 5/3 with abdominal pain, gross hematuria, and supratherapeutic INR. CT abdomen at OSH showed increase in size of perinephric hematoma. Patient s/p embolization on 5/13. Last dose warfarin PTA was on 5/2 and patient is s/p 8 mg total of Vitamin K during hospitalization. INR goal has been decreased to 2-2.5 per MD. Warfarin resumed 5/14.    INR today remains SUPRAtherapeutic at 3.1  Hgb 7.1, Pltc WNL (on 5/19)  Per RN, some dark red, dried blood in brief this AM. No overt bleeding noted at this time per nursing   Goal of Therapy:  INR 2-2.5 Monitor platelets by anticoagulation protocol: Yes   Plan:  - Hold warfarin dose today - Daily PT/INR - Follow CBC and for s/sx of bleeding    Thank you for allowing pharmacy to be a part of this patient's care.  Lindell Spar, PharmD, BCPS Clinical Pharmacist 09/05/2018 4:00 PM   **Pharmacist phone directory can now be found on [email or web address] ([email or web address]).  Listed under [place].

## 2018-09-05 NOTE — Evaluation (Signed)
Physical Therapy Assessment and Plan  Patient Details  Name: [name] [name] MRN: [id number] Date of Birth: 05-Mar-1966  PT Diagnosis: Coordination disorder, Difficulty walking, Muscle weakness and Pain in low back and stomach Rehab Potential: Fair ELOS: 10-14 days   Today's Date: 09/05/2018 PT Individual Time: 0800-0840 PT Individual Time Calculation (min): 40 min  and Today's Date: 09/05/2018 PT Missed Time: 20 Minutes Missed Time Reason: Other (Comment)(dizziness)   Problem List:  Patient Active Problem List   Diagnosis Date Noted  . Debility 09/04/2018  . Perinephric hematoma 08/20/2018  . Supratherapeutic INR 08/20/2018  . Gross hematuria 08/19/2018  . Acute blood loss anemia 08/02/2018  . Renal hematoma, left, initial encounter 08/01/2018  . 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 ICD (implantable cardioverter-defibrillator) pocket hematoma 01/01/2018  . Encounter for therapeutic drug monitoring 09/14/2017  . S/P aortic valve replacement with bileaflet mechanical valve 09/01/2017  . S/P CABG x 1 09/01/2017  . Coronary artery disease involving native coronary artery of native heart without angina pectoris   . 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 Abnormal echocardiogram   . Mitral regurgitation   . Severe aortic insufficiency   . Acute on chronic combined systolic and diastolic heart failure ([place])   . Systolic CHF ([place]) 96/78/9381  . 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 Dermatitis 04/06/2016  . End stage renal disease ([place]) 12/22/2011  . Sinus tachycardia   . ESRD (end stage renal disease) ([place])   . Orthostatic hypotension   . Chest discomfort   . Morbid obesity ([place])   . Syncope   . Aneurysm [facility])     Past Medical History:  Past Medical History:  Diagnosis Date  . Aneurysm ([place])     Right arm fistula 3 aneurysms 2011,   plans to have a new procedure  . Angina   . Chest discomfort   . CHF (congestive heart  failure) ([place])   . Coronary artery disease   . Coronary artery disease involving native coronary artery of native heart without angina pectoris   . Dialysis patient ([place])   . Dysrhythmia   . ESRD (end stage renal disease) ([place])    on hemodialysis T_T_S  . Hypertension   . Leg pain   . Mitral regurgitation   . Morbid obesity ([place])   . Orthostatic hypotension   . Overweight(278.02)   . Renal insufficiency   . 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 Severe aortic insufficiency   . Sinus tachycardia   . Syncope     positional after dialysis... 2008   Past Surgical History:  Past Surgical History:  Procedure Laterality Date  . A/V FISTULAGRAM Left 06/30/2016   Procedure: A/V Fistulagram;  Surgeon: Algernon Huxley, MD;  Location: [place] [facility];  Service: Cardiovascular;  Laterality: Left;  . AORTIC VALVE REPLACEMENT N/A 09/01/2017   Procedure: AORTIC VALVE REPLACEMENT (AVR);  Surgeon: [name], MD;  Location: [place];  Service: Open Heart Surgery;  Laterality: N/A;  . ARTERIOVENOUS GRAFT PLACEMENT    . AV FISTULA PLACEMENT    . AV FISTULA PLACEMENT Left 11/18/2015   Procedure: ARTERIOVENOUS (AV) FISTULA CREATION ( RADIOCEPHALIC );  Surgeon: Algernon Huxley, MD;  Location: [facility];  Service: Vascular;  Laterality: Left;  . AV FISTULA PLACEMENT Left 03/23/2016   Procedure: ARTERIOVENOUS (AV) FISTULA  CREATION ( REVISION );  Surgeon: Algernon Huxley, MD;  Location: [facility];  Service: Vascular;  Laterality: Left;  . [place] REMOVAL Left 03/23/2016   Procedure: REMOVAL OF ARTERIOVENOUS GORETEX GRAFT ([place]);  Surgeon: Algernon Huxley, MD;  Location: [facility];  Service: Vascular;  Laterality: Left;  . CENTRAL LINE INSERTION Right 09/01/2017   Procedure: FLOROSCOPY GUIDED PLACEMENT OF RIGHT FEMORAL CENTRAL LINE TIMES TWO  AND PLACEMENT OF SWAN GANZ CATHETER;  Surgeon:  [name], MD;  Location: [place];  Service: Open Heart Surgery;  Laterality: Right;  . CORONARY ARTERY BYPASS GRAFT N/A 09/01/2017   Procedure: CORONARY ARTERY BYPASS GRAFTING (CABG) X 1, USING RIGHT GREATER SAPHENOUS VEIN HARVESTED ENDOSCOPICALLY;  Surgeon: [name], MD;  Location: [place];  Service: Open Heart Surgery;  Laterality: N/A;  . DIALYSIS/PERMA CATHETER REMOVAL N/A 08/01/2016   Procedure: Dialysis/Perma Catheter Removal;  Surgeon: Algernon Huxley, MD;  Location: [place] [facility];  Service: Cardiovascular;  Laterality: N/A;  . HERNIA REPAIR    . INSERTION OF DIALYSIS CATHETER  03/01/2011   Procedure: INSERTION OF DIALYSIS CATHETER;  Surgeon: Elam Dutch, MD;  Location: [place];  Service: Vascular;  Laterality: Left;  Exchange of Dialysis Catheter to 27cm 15Fr. Arrow Catheter  . INSERTION OF DIALYSIS CATHETER  09/01/2017   Procedure: PLACEMENT OF TRIALYSIS SHORT TERM DIALYSIS CATHETER;  Surgeon: [name], MD;  Location: [place];  Service: Open Heart Surgery;;  . IR EMBO ART  VEN HEMORR LYMPH EXTRAV  INC GUIDE ROADMAPPING  08/29/2018  . IR FLUORO GUIDE CV LINE RIGHT  08/27/2018  . IR RENAL SUPRASEL UNI S&I MOD SED  08/29/2018  . IR US GUIDE VASC ACCESS RIGHT  08/27/2018  . IR US GUIDE VASC ACCESS RIGHT  08/29/2018  . MULTIPLE EXTRACTIONS WITH ALVEOLOPLASTY N/A 08/07/2017   Procedure: Extraction of tooth #2, 7,8,13,14,15,17, and 31 with alveoloplasty and gross debridement of remaining teeth;  Surgeon: [name], DDS;  Location: [place];  Service: Oral Surgery;  Laterality: N/A;  . PERIPHERAL VASCULAR CATHETERIZATION N/A 09/01/2014   Procedure: A/V Shuntogram/Fistulagram;  Surgeon: Algernon Huxley, MD;  Location: [place] [facility];  Service: Cardiovascular;  Laterality: N/A;  . PERIPHERAL VASCULAR CATHETERIZATION Right 09/01/2014   Procedure: Thrombectomy;  Surgeon: Algernon Huxley, MD;  Location: [place] [facility];  Service: Cardiovascular;  Laterality: Right;  .  PERIPHERAL VASCULAR CATHETERIZATION Right 09/01/2014   Procedure: A/V Shunt Intervention;  Surgeon: Algernon Huxley, MD;  Location: [place] [facility];  Service: Cardiovascular;  Laterality: Right;  . PERIPHERAL VASCULAR CATHETERIZATION N/A 09/17/2014   Procedure: A/V Shuntogram/Fistulagram;  Surgeon: Algernon Huxley, MD;  Location: [place] [facility];  Service: Cardiovascular;  Laterality: N/A;  . PERIPHERAL VASCULAR CATHETERIZATION Right 10/17/2014   Procedure: Thrombectomy;  Surgeon: Katha Cabal, MD;  Location: [place] [facility];  Service: Cardiovascular;  Laterality: Right;  . PERIPHERAL VASCULAR CATHETERIZATION N/A 10/17/2014   Procedure: A/V Shuntogram/Fistulagram;  Surgeon: Katha Cabal, MD;  Location: [place] [facility];  Service: Cardiovascular;  Laterality: N/A;  . PERIPHERAL VASCULAR CATHETERIZATION N/A 10/17/2014   Procedure: A/V Shunt Intervention;  Surgeon: Katha Cabal, MD;  Location: [place] [facility];  Service: Cardiovascular;  Laterality: N/A;  . PERIPHERAL VASCULAR CATHETERIZATION Right 11/04/2014   Procedure: Thrombectomy;  Surgeon: Katha Cabal, MD;  Location: [place] [facility];  Service: Cardiovascular;  Laterality: Right;  . PERIPHERAL VASCULAR CATHETERIZATION Left 11/04/2014  Procedure: Visceral Venography;  Surgeon: Katha Cabal, MD;  Location: [place] [facility];  Service: Cardiovascular;  Laterality: Left;  . PERIPHERAL VASCULAR CATHETERIZATION Right 11/27/2014   Procedure: A/V Shuntogram/Fistulagram;  Surgeon: Algernon Huxley, MD;  Location: [place] [facility];  Service: Cardiovascular;  Laterality: Right;  . PERIPHERAL VASCULAR CATHETERIZATION Right 11/27/2014   Procedure: A/V Shunt Intervention;  Surgeon: Algernon Huxley, MD;  Location: [place] [facility];  Service: Cardiovascular;  Laterality: Right;  . PERIPHERAL VASCULAR CATHETERIZATION Right 12/31/2014   Procedure: Thrombectomy;  Surgeon: Algernon Huxley, MD;  Location: [place] [facility];   Service: Cardiovascular;  Laterality: Right;  . PERIPHERAL VASCULAR CATHETERIZATION Right 01/12/2015   Procedure: A/V Shuntogram/Fistulagram;  Surgeon: Algernon Huxley, MD;  Location: [place] [facility];  Service: Cardiovascular;  Laterality: Right;  . PERIPHERAL VASCULAR CATHETERIZATION N/A 01/12/2015   Procedure: A/V Shunt Intervention;  Surgeon: Algernon Huxley, MD;  Location: [place] [facility];  Service: Cardiovascular;  Laterality: N/A;  . PERIPHERAL VASCULAR CATHETERIZATION Right 01/28/2015   Procedure: Thrombectomy;  Surgeon: Algernon Huxley, MD;  Location: [place] [facility];  Service: Cardiovascular;  Laterality: Right;  . PERIPHERAL VASCULAR CATHETERIZATION N/A 06/08/2015   Procedure: A/V Shuntogram/Fistulagram;  Surgeon: Algernon Huxley, MD;  Location: [place] [facility];  Service: Cardiovascular;  Laterality: N/A;  . PERIPHERAL VASCULAR CATHETERIZATION N/A 06/08/2015   Procedure: A/V Shunt Intervention;  Surgeon: Algernon Huxley, MD;  Location: [place] [facility];  Service: Cardiovascular;  Laterality: N/A;  . PERIPHERAL VASCULAR CATHETERIZATION Right 07/06/2015   Procedure: A/V Shuntogram/Fistulagram;  Surgeon: Algernon Huxley, MD;  Location: [place] [facility];  Service: Cardiovascular;  Laterality: Right;  . PERIPHERAL VASCULAR CATHETERIZATION N/A 07/06/2015   Procedure: A/V Shunt Intervention;  Surgeon: Algernon Huxley, MD;  Location: [place] [facility];  Service: Cardiovascular;  Laterality: N/A;  . PERIPHERAL VASCULAR CATHETERIZATION N/A 08/04/2015   Procedure: graft declot;  Surgeon: Katha Cabal, MD;  Location: [place] [facility];  Service: Cardiovascular;  Laterality: N/A;  . PERIPHERAL VASCULAR CATHETERIZATION Right 08/25/2015   Procedure: A/V Shuntogram/Fistulagram;  Surgeon: Katha Cabal, MD;  Location: [place] [facility];  Service: Cardiovascular;  Laterality: Right;  . PERIPHERAL VASCULAR CATHETERIZATION N/A 11/04/2015   Procedure: Dialysis/Perma Catheter Insertion;  Surgeon:  Algernon Huxley, MD;  Location: [place] [facility];  Service: Cardiovascular;  Laterality: N/A;  . PERIPHERAL VASCULAR CATHETERIZATION Left 12/14/2015   Procedure: A/V Shuntogram/Fistulagram;  Surgeon: Algernon Huxley, MD;  Location: [place] [facility];  Service: Cardiovascular;  Laterality: Left;  . PERIPHERAL VASCULAR CATHETERIZATION N/A 12/14/2015   Procedure: A/V Shunt Intervention;  Surgeon: Algernon Huxley, MD;  Location: [place] [facility];  Service: Cardiovascular;  Laterality: N/A;  . PERIPHERAL VASCULAR CATHETERIZATION N/A 12/17/2015   Procedure: Dialysis/Perma Catheter Insertion;  Surgeon: Algernon Huxley, MD;  Location: [place] [facility];  Service: Cardiovascular;  Laterality: N/A;  . PERIPHERAL VASCULAR CATHETERIZATION Left 12/22/2015   Procedure: Dialysis/Perma Catheter Insertion;  Surgeon: Katha Cabal, MD;  Location: [place] [facility];  Service: Cardiovascular;  Laterality: Left;  . PERIPHERAL VASCULAR CATHETERIZATION Left 02/29/2016   Procedure: A/V Shuntogram/Fistulagram;  Surgeon: Algernon Huxley, MD;  Location: [place] [facility];  Service: Cardiovascular;  Laterality: Left;  . PERIPHERAL VASCULAR CATHETERIZATION N/A 02/29/2016   Procedure: A/V Shunt Intervention;  Surgeon: Algernon Huxley, MD;  Location: [place] [facility];  Service: Cardiovascular;  Laterality: N/A;  .  POCKET REVISION/RELOCATION N/A 01/01/2018   Procedure: POCKET REVISION/RELOCATION;  Surgeon: Evans Lance, MD;  Location: [place] [facility];  Service: Cardiovascular;  Laterality: N/A;  . right arm graft     for dyalisis  . RIGHT/LEFT HEART CATH AND CORONARY ANGIOGRAPHY N/A 08/02/2017   Procedure: RIGHT/LEFT HEART CATH AND CORONARY ANGIOGRAPHY;  Surgeon: Larey Dresser, MD;  Location: [place] [facility];  Service: Cardiovascular;  Laterality: N/A;  . SUBQ ICD IMPLANT N/A 11/28/2017   Procedure: SUBQ ICD IMPLANT;  Surgeon: Evans Lance, MD;  Location: [place] [facility];  Service: Cardiovascular;  Laterality:  N/A;  . SUBQ ICD REVISION N/A 02/12/2018   Procedure: SUBQ ICD REVISION(REMOVAL);  Surgeon: Evans Lance, MD;  Location: [place] [facility];  Service: Cardiovascular;  Laterality: N/A;  . TEE WITHOUT CARDIOVERSION N/A 07/31/2017   Procedure: TRANSESOPHAGEAL ECHOCARDIOGRAM (TEE);  Surgeon: Josue Hector, MD;  [facility];  Service: Cardiovascular;  Laterality: N/A;  . TEE WITHOUT CARDIOVERSION N/A 09/01/2017   Procedure: TRANSESOPHAGEAL ECHOCARDIOGRAM (TEE);  Surgeon: [name], MD;  Location: [place];  Service: Open Heart Surgery;  Laterality: N/A;  . THROMBECTOMY    . THROMBECTOMY W/ EMBOLECTOMY  03/01/2011   Procedure: THROMBECTOMY ARTERIOVENOUS GORE-TEX GRAFT;  Surgeon: Elam Dutch, MD;  Location: [place];  Service: Vascular;  Laterality: Right;  Attempted Thrombectomy of Old  Right Upper Arm Arteriovenous gortex Graft. Insertion of new Arteriovenous Graft using 28m x 50cm Gortex Stretch graft.   . THROMBECTOMY W/ EMBOLECTOMY  07/11/2011   Procedure: THROMBECTOMY ARTERIOVENOUS GORE-TEX GRAFT;  Surgeon: TRosetta Posner MD;  [place]: [place]  Service: Vascular;  Laterality: Right;  . UMBILICAL HERNIA REPAIR    . VENOGRAM N/A 08/23/2011   Procedure: VENOGRAM;  Surgeon: VSerafina Mitchell MD;  [facility];  Service: Cardiovascular;  Laterality: N/A;    Assessment & Plan Clinical Impression: Patient is a 53year old male with history of ESRD due to polycystic kidney disease-- HD TTS, HTN, CAD/AS s/p CABG with mechanical AVR on chronic coumadin recent admission 4/15-4/21 forsupratherapeutic INR andruptured hemorrhagic renal cyst. He was readmitted on 08/19/2018 with tachycardia, fall a few days prior to admission with difficulty walking and inability to get to HD, LUQ and flank pain radiating to back with hematuria.CT abdomen at OSH showed increase in size of perinephric hematoma and INR noted to be supratherapeutic at 5.5. INR was reversed and he was transfused with 1  units packed red blood cells for hemoglobin of 7.6. Urology consulted and recommended supportive care with bedrest and serial H&H tillhemoglobin stable. He continued to have ongoing blood loss with pain and required 5 total units of PRBC.Dr. BAlinda Moneyrecommended consulting radiology consulted for assistance and patient underwent left renal embolization by Dr. WPascal Luxon05/13. Coumadin resumed andINR trending back upwards. He has been weaned off PCA and physical therapy initiated however patient has been unable to tolerate standing due to BLE weakness or sitting up in a chair due to severe back/flankpain. CIR was recommended due to functional decline. Patient transferred to CIR on 09/04/2018 .   Patient currently requires min with bed mobility secondary to muscle weakness, decreased cardiorespiratoy endurance and decreased sitting balance and decreased balance strategies. Unable to assess transfers and locomotion 2/2 dizziness at EOB and pt unwilling to attempt anything further at this time, will continue to assess. Prior to hospitalization, patient was independent  with mobility and lived with Spouse in a House home.  Home  access is 6Stairs to enter.  Patient will benefit from skilled PT intervention to maximize safe functional mobility, minimize fall risk and decrease caregiver burden for planned discharge home with 24 hour assist.  Anticipate patient will benefit from follow up [facility] at discharge.  PT - End of Session Activity Tolerance: Tolerates < 10 min activity, no significant change in vital signs Endurance Deficit: Yes Endurance Deficit Description: activity limited by pain and dizziness with upright posture PT Assessment Rehab Potential (ACUTE/IP ONLY): Fair PT Barriers to Discharge: Medical stability;Inaccessible home environment PT Barriers to Discharge Comments: 6 steps to enter home PT Patient demonstrates impairments in the following area(s): Balance;Endurance;Pain;Safety PT Transfers  Functional Problem(s): Bed to Chair;Bed Mobility;Car;Furniture;Floor PT Locomotion Functional Problem(s): Ambulation;Wheelchair Mobility;Stairs PT Plan PT Intensity: Minimum of 1-2 x/day ,45 to 90 minutes PT Frequency: 5 out of 7 days PT Duration Estimated Length of Stay: 10-14 days PT Treatment/Interventions: Ambulation/gait training;Discharge planning;DME/adaptive equipment instruction;Functional mobility training;Pain management;Psychosocial support;UE/LE Strength taining/ROM;Therapeutic Activities;Splinting/orthotics;Wheelchair propulsion/positioning;UE/LE Coordination activities;Therapeutic Exercise;Skin care/wound management;Stair training;Patient/family education;Neuromuscular re-education;Functional electrical stimulation;Disease management/prevention;Community reintegration;Balance/vestibular training PT Transfers Anticipated Outcome(s): min assist PT Locomotion Anticipated Outcome(s): min assist w/ LRAD  PT Recommendation Follow Up Recommendations: Home health PT Patient destination: Home Equipment Recommended: To be determined Equipment Details: has RW, manual w/c, and cane   Skilled Therapeutic Intervention  Pt in supine and agreeable to therapy, pain as detailed below. Pt performed R/L rolling w/ CGA, fearful of getting to EOB 2/2 dizziness with attempts in previous therapy sessions. Eventually agreeable, performed supine<>sit w/ min assist. Maintained static/dynamic sitting balance w/ supervision for ~3-4 minutes. C/o dizziness once at EOB, states "I feel like I'm going to pass out any second". Vitals as detailed below and returned to supine. Discussed importance of pushing through uncomfortable and scary moments in controlled rehab environment. Pt verbalized understanding but wanted to remain in supine. Elevated LEs and elevated trunk to promote increased tolerance to upright. Donned thigh high TEDs as well. Instructed pt in results of PT evaluation as detailed below, PT POC, rehab  potential, rehab goals, and discharge recommendations. Discussed that LTGs may change and be upgraded as transfers and gait are assessed. Ended session in supine, all needs in reach.  PT Evaluation Precautions/Restrictions Precautions Precautions: Fall Restrictions Weight Bearing Restrictions: No General   Vital SignsTherapy Vitals Pulse Rate: (!) 105 Resp: 18 BP: 118/79 Patient Position (if appropriate): Sitting Oxygen Therapy SpO2: 97 % O2 Device: Room Air Pain Pain Assessment Pain Scale: 0-10 Pain Score: 9  Pain Type: Acute pain Pain Location: Back Pain Descriptors / Indicators: Jabbing;Sharp;Shooting Pain Frequency: Intermittent Pain Onset: On-going Pain Intervention(s): Medication (See eMAR);Repositioned Home Living/Prior Functioning Home Living Available Help at Discharge: Family;Available 24 hours/day(wife or adult daughter) Type of Home: House Home Access: Stairs to enter [employer] of Steps: 6 Entrance Stairs-Rails: Can reach both Home Layout: One level Bathroom Shower/Tub: Chiropodist: Standard  Lives With: Spouse Prior Function Level of Independence: Independent with basic ADLs;Independent with homemaking with wheelchair;Independent with homemaking with ambulation;Independent with gait  Able to Take Stairs?: Yes Driving: Yes(drives himself to/from dialysis, wife drives sometimes) Vocation: Retired Comments: reports falls at home recently, reports that it was related to "blacking out" and not balance issues Vision/Perception  Perception Perception: Within Functional Limits Praxis Praxis: Intact  Cognition Overall Cognitive Status: Within Functional Limits for tasks assessed Arousal/Alertness: Awake/alert Memory: Appears intact Awareness: Appears intact Problem Solving: Appears intact Safety/Judgment: Appears intact Sensation Sensation Light Touch: Appears Intact Coordination Gross Motor Movements are  Fluid and  Coordinated: No Heel Shin Test: impaired 2/2 weakness Motor  Motor Motor: Within Functional Limits Motor - Skilled Clinical Observations: generalized weakness  Mobility Bed Mobility Bed Mobility: Rolling Right;Rolling Left;Supine to Sit;Sit to Supine Rolling Right: Contact Guard/Touching assist Rolling Left: Contact Guard/Touching assist Supine to Sit: Minimal Assistance - Patient > 75% Sit to Supine: Minimal Assistance - Patient > 75% Locomotion  Gait Ambulation: No Gait Gait: No Stairs / Additional Locomotion Stairs: No Wheelchair Mobility Wheelchair Mobility: No  Trunk/Postural Assessment  Cervical Assessment Cervical Assessment: Within Functional Limits Thoracic Assessment Thoracic Assessment: Within Functional Limits Lumbar Assessment Lumbar Assessment: Within Functional Limits Postural Control Postural Control: Within Functional Limits  Balance Balance Balance Assessed: Yes Static Sitting Balance Static Sitting - Balance Support: Feet supported;No upper extremity supported Static Sitting - Level of Assistance: 5: Stand by assistance Dynamic Sitting Balance Dynamic Sitting - Balance Support: Feet supported;No upper extremity supported Dynamic Sitting - Level of Assistance: 5: Stand by assistance Extremity Assessment  RLE Assessment RLE Assessment: Exceptions to [facility] Passive Range of Motion (PROM) Comments: [facility] General Strength Comments: globally 3+ to 4-/5 LLE Assessment LLE Assessment: Exceptions to [facility] Passive Range of Motion (PROM) Comments: W[facility] General Strength Comments: globally 3+ to 4-/5    Refer to Care Plan for Long Term Goals  Recommendations for other services: None   Discharge Criteria: Patient will be discharged from PT if patient refuses treatment 3 consecutive times without medical reason, if treatment goals not met, if there is a change in medical status, if patient makes no progress towards goals or if patient is discharged from  hospital.  The above assessment, treatment plan, treatment alternatives and goals were discussed and mutually agreed upon: by patient  Sina Sumpter K Kym Fenter 09/05/2018, 8:55 AM

## 2018-09-06 ENCOUNTER — Inpatient Hospital Stay (HOSPITAL_COMMUNITY): Payer: Medicare Other | Admitting: *Deleted

## 2018-09-06 ENCOUNTER — Inpatient Hospital Stay (HOSPITAL_COMMUNITY): Payer: Medicare Other | Admitting: Occupational Therapy

## 2018-09-06 ENCOUNTER — Inpatient Hospital Stay (HOSPITAL_COMMUNITY): Payer: Medicare Other

## 2018-09-06 LAB — RENAL FUNCTION PANEL
Albumin: 1.5 g/dL — ABNORMAL LOW (ref 3.5–5.0)
Anion gap: 11 (ref 5–15)
BUN: 50 mg/dL — ABNORMAL HIGH (ref 6–20)
CO2: 25 mmol/L (ref 22–32)
Calcium: 7.8 mg/dL — ABNORMAL LOW (ref 8.9–10.3)
Chloride: 100 mmol/L (ref 98–111)
Creatinine, Ser: 7.07 mg/dL — ABNORMAL HIGH (ref 0.61–1.24)
GFR calc Af Amer: 9 mL/min — ABNORMAL LOW (ref >60)
GFR calc non Af Amer: 8 mL/min — ABNORMAL LOW (ref >60)
Glucose, Bld: 95 mg/dL (ref 70–99)
Phosphorus: 4.2 mg/dL (ref 2.5–4.6)
Potassium: 3.4 mmol/L — ABNORMAL LOW (ref 3.5–5.1)
Sodium: 136 mmol/L (ref 135–145)

## 2018-09-06 LAB — CBC WITH DIFFERENTIAL/PLATELET
Abs Immature Granulocytes: 0.07 10*3/uL (ref 0.00–0.07)
Basophils Absolute: 0.1 10*3/uL (ref 0.0–0.1)
Basophils Relative: 1 %
Eosinophils Absolute: 0.7 10*3/uL — ABNORMAL HIGH (ref 0.0–0.5)
Eosinophils Relative: 6 %
HCT: 25.8 % — ABNORMAL LOW (ref 39.0–52.0)
Hemoglobin: 7.6 g/dL — ABNORMAL LOW (ref 13.0–17.0)
Immature Granulocytes: 1 %
Lymphocytes Relative: 8 %
Lymphs Abs: 1 10*3/uL (ref 0.7–4.0)
MCH: 25.8 pg — ABNORMAL LOW (ref 26.0–34.0)
MCHC: 29.5 g/dL — ABNORMAL LOW (ref 30.0–36.0)
MCV: 87.5 fL (ref 80.0–100.0)
Monocytes Absolute: 0.7 10*3/uL (ref 0.1–1.0)
Monocytes Relative: 5 %
Neutro Abs: 10.8 10*3/uL — ABNORMAL HIGH (ref 1.7–7.7)
Neutrophils Relative %: 79 %
Platelets: 370 10*3/uL (ref 150–400)
RBC: 2.95 MIL/uL — ABNORMAL LOW (ref 4.22–5.81)
RDW: 17.8 % — ABNORMAL HIGH (ref 11.5–15.5)
WBC: 13.4 10*3/uL — ABNORMAL HIGH (ref 4.0–10.5)
nRBC: 0 % (ref 0.0–0.2)

## 2018-09-06 LAB — CBC
HCT: 24.3 % — ABNORMAL LOW (ref 39.0–52.0)
Hemoglobin: 7.2 g/dL — ABNORMAL LOW (ref 13.0–17.0)
MCH: 26.3 pg (ref 26.0–34.0)
MCHC: 29.6 g/dL — ABNORMAL LOW (ref 30.0–36.0)
MCV: 88.7 fL (ref 80.0–100.0)
Platelets: 310 10*3/uL (ref 150–400)
RBC: 2.74 MIL/uL — ABNORMAL LOW (ref 4.22–5.81)
RDW: 18.1 % — ABNORMAL HIGH (ref 11.5–15.5)
WBC: 10.9 10*3/uL — ABNORMAL HIGH (ref 4.0–10.5)
nRBC: 0 % (ref 0.0–0.2)

## 2018-09-06 LAB — PROTIME-INR
INR: 3.3 — ABNORMAL HIGH (ref 0.8–1.2)
Prothrombin Time: 32.7 seconds — ABNORMAL HIGH (ref 11.4–15.2)

## 2018-09-06 MED ORDER — PENTAFLUOROPROP-TETRAFLUOROETH EX AERO: 1.0000 "application " | INHALATION_SPRAY | CUTANEOUS | Status: DC | PRN

## 2018-09-06 MED ORDER — LIDOCAINE HCL (PF) 1 % IJ SOLN: 5.0000 mL | INTRAMUSCULAR | Status: DC | PRN

## 2018-09-06 MED ORDER — MEGESTROL ACETATE 400 MG/10ML PO SUSP
400.0000 mg | Freq: Two times a day (BID) | ORAL | Status: DC
  Administered 2018-09-06 – 2018-09-18 (×22): 400 mg via ORAL
  Filled 2018-09-06 (×24): qty 10

## 2018-09-06 MED ORDER — SODIUM CHLORIDE 0.9 % IV SOLN: 100.0000 mL | INTRAVENOUS | Status: DC | PRN

## 2018-09-06 MED ORDER — ALTEPLASE 2 MG IJ SOLR: 2.0000 mg | Freq: Once | INTRAMUSCULAR | Status: DC | PRN

## 2018-09-06 MED ORDER — HEPARIN SODIUM (PORCINE) 1000 UNIT/ML DIALYSIS: 1000.0000 [IU] | INTRAMUSCULAR | Status: DC | PRN

## 2018-09-06 MED ORDER — LIDOCAINE-PRILOCAINE 2.5-2.5 % EX CREA: 1.0000 "application " | TOPICAL_CREAM | CUTANEOUS | Status: DC | PRN

## 2018-09-06 NOTE — Progress Notes (Signed)
Occupational Therapy Session Note  Patient Details  Name: [name] [name] MRN: [id number] Date of Birth: 08-21-1965  Today's Date: 09/06/2018 OT Individual Time: 0700-0800 OT Individual Time Calculation (min): 60 min    Short Term Goals: Week 1:  OT Short Term Goal 1 (Week 1): Pt will tolerate sitting upright out of bed for 3 hrs a day. OT Short Term Goal 2 (Week 1): Pt will perform sit to stand with mod A +1 for LB dressing  OT Short Term Goal 3 (Week 1): Pt will perform lateral transfers (scoot or squat) with min A  OT Short Term Goal 4 (Week 1): Pt will LB dressing with mod A (sit to stand).  Skilled Therapeutic Interventions/Progress Updates:    Pt resting in bed upon arrival.  Pt states he "feels better" than previous day although he wasn't able to rest more.  Pt agreeable to sitting EOB with initial plan of transferring to w/c. BP supine 102/67, sitting EOB 116/76 with slight dizziness quickly resolved.  Pt required min A for supine>sit EOB.  Pt stated he needed to have a BM but was incontinent before a BSC was placed beside bed.  Pt returned to supine and required tot A for hygiene. Pt with dark red feces. RN notified. Pt expressed how embarrassed he felt. Emotional support provided. Pt able to roll R<>L using bed rails without assistance.  Discussed purpose of CIR and role of OT to increase independence with BADLs. Pt appreciative and states that his goal is to go home and walk up his steps into home. Pt remained in bed with all needs within reach and bed alarm activated.   Therapy Documentation Precautions:  Precautions Precautions: Fall Precaution Comments: pain along flanks and abdomen; ice as needed Restrictions Weight Bearing Restrictions: No    Pain:  Pt c/o L abdominal and L back pain (5/10); repositioned and meds admin prior to therapy  Therapy/Group: Individual Therapy  [name] 09/06/2018, 8:07 AM

## 2018-09-06 NOTE — Progress Notes (Signed)
Occupational Therapy Note  Patient Details  Name: [name] [name] MRN: [id number] Date of Birth: 1966-03-09  Today's Date: 09/06/2018 OT Missed Time: 40 Minutes Missed Time Reason:   Due to nausea and dizziness; declined out of bed politely. Got some snacks for pt to encourage eating.       Willeen Cass [facility] 09/06/2018, 12:52 PM

## 2018-09-06 NOTE — Progress Notes (Signed)
Physical Therapy Session Note  Patient Details  Name: [name] [name] MRN: [id number] Date of Birth: [birth year]  Today's Date: 09/06/2018 PT Individual Time: 0900-1000 PT Individual Time Calculation (min): 60 min   Short Term Goals: Week 1:  PT Short Term Goal 1 (Week 1): Pt will initiate OOB mobility PT Short Term Goal 2 (Week 1): Pt will initiate gait training PT Short Term Goal 3 (Week 1): Pt will perform sit<>stand w/ max assist x1 PT Short Term Goal 4 (Week 1): Pt will tolerate 30 min of upright activity w/o increase in fatigue  Skilled Therapeutic Interventions/Progress Updates:     Patient in bed upon PT arrival. Patient alert and agreeable to PT session.  Therapeutic Activity: Bed Mobility: Patient performed supine to/from sit with min A to the L and the R with 9/10 dizziness both trials, BP stable-see below. Provided verbal cues for use of elbows to push up to sitting. Patient requested to lie down after second trial sitting EOB due to 10/10 dizziness. Patient declined attempting to transfer to the TIS w/c due to significant dizziness. PT educated on benefits of getting OOB and the need for increased mobility for the patients recovery. Patient continued to decline further mobility, but was agreeable to bed level exercises. Preceded with bed level exercises for remainder of session due patient reporting 7-8/10 dizziness in supine.   Therapeutic Exercise: Patient performed the following exercises with verbal and tactile cues for proper technique. -B SLR x6 -B glut sets x10 with 5 sec hold -B quad sets x10 with 5 sec hold -B heel slides x10 -B isometric hip adduction x10 -bridging 2x5 -B ankle pumps x20  Patient in bed at end of session with breaks locked, bed alarm set, and all needs within reach.    Therapy Documentation Precautions:  Precautions Precautions: Fall Precaution Comments: pain along flanks and abdomen; ice as needed Restrictions Weight Bearing Restrictions:  No Vital Signs: Therapy Vitals Pulse Rate: 87 BP: 101/69  Position: Lying BP: 103/86 Position: Sitting BP: 107/92 Position: Sitting (second trial) Pain: Pain Assessment Pain Scale: 0-10 Pain Score: 6  Pain Type: Acute pain Pain Location: Back Pain Orientation: Lower Pain Descriptors / Indicators: Nagging Pain Frequency: Intermittent Pain Onset: Gradual Pain Intervention(s): Repositioned;Relaxation    Therapy/Group: Individual Therapy  Ryin Schillo L Aydan Levitz PT, DPT    09/06/2018, 12:47 PM

## 2018-09-06 NOTE — Progress Notes (Signed)
[place] for warfarin Indication: Mechanical valve  No Known Allergies  Patient Measurements: Weight: 284 lb 6.3 oz (129 kg)  Vital Signs: Temp: 97.5 F (36.4 C) (05/21 1238) Temp Source: Oral (05/21 1238) BP: 90/49 (05/21 1330) Pulse Rate: 89 (05/21 1330)  Labs: Recent Labs    09/04/18 0325 09/04/18 0715 09/05/18 5830 09/06/18 0446 09/06/18 1305 09/06/18 1307  HGB 7.1*  --   --  7.2* 7.6*  --   HCT 23.7*  --   --  24.3* 25.8*  --   PLT 330  --   --  310 370  --   LABPROT 30.4*  --  31.2* 32.7*  --   --   INR 3.0*  --  3.1* 3.3*  --   --   CREATININE  --  7.38*  --   --   --  7.07*    Estimated Creatinine Clearance: 17 mL/min (A) (by C-G formula based on SCr of 7.07 mg/dL (H)).  Assessment: 53 YOM on warfarin PTA for mech aortic valve. Recent admission 4/15-4/21 forsupratherapeutic INR andruptured hemorrhagic renal cyst. Readmitted 5/3 with abdominal pain, gross hematuria, and supratherapeutic INR. CT abdomen at OSH showed increase in size of perinephric hematoma. Patient s/p embolization on 5/13. Last dose warfarin PTA was on 5/2 and patient is s/p 8 mg total of Vitamin K during hospitalization. INR goal has been decreased to 2-2.5 per MD. Warfarin resumed 5/14.    INR today remains SUPRAtherapeutic at 3.3  Hgb 7.6, Pltc WNL (on 5/21)  Goal of Therapy:  INR 2-2.5 Monitor platelets by anticoagulation protocol: Yes   Plan:  - Hold warfarin dose today - Daily PT/INR - Follow CBC and for s/sx of bleeding    Rahn Lacuesta A. Levada Dy, PharmD, [place] Pager: [phone] Please utilize Amion for appropriate phone number to reach the unit pharmacist ([place])

## 2018-09-06 NOTE — Progress Notes (Signed)
[facility] ROUNDING NOTE   Subjective:   This is a 53 year old gentleman end-stage renal disease secondary to polycystic kidney disease, currently on dialysis Tuesday Thursday Saturday at [facility] clinic followed by Dr. Lowanda Foster.  He has a history of chronic hypotension and continues on Midodrin.  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  He has congestive heart failure with systolic dysfunction with an ejection fraction about 30% per 2D echo 10/25/2017. He presented with a left-sided perinephric hematoma in the setting of a supratherapeutic INR required a total of 7 units packed red blood cells and transcatheter embolization of left kidney 08/29/2018.  Patient underwent successful dialysis 09/04/2018 scheduled for his next dialysis treatment 09/06/2018  Blood pressure 101/69 pulse 87   Sodium 135 potassium 3.6 chloride 97 CO2 24 BUN 55 creatinine 7.38 glucose 70 hemoglobin 7.1 WBC 9.4 platelets 230 INR 3.1   hemoglobin 7.2 INR 3.3   Amiodarone 200 mg daily atorvastatin 80 mg daily , Proamatidine 15 mg 3 times daily, Fosrenol 1 g with meals 500 mg with snacks, Aranesp 100 mcg q. Tuesday, Coumadin per INR, Feraheme 510 mg x 1 09/03/2018    Objective:  Vital signs in last 24 hours:  Temp:  [97.6 F (36.4 C)-98.1 F (36.7 C)] 98.1 F (36.7 C) (05/20 1917) Pulse Rate:  [85-87] 87 (05/21 0828) Resp:  [16-17] 17 (05/20 1917) BP: (91-101)/(60-69) 101/69 (05/21 0828) SpO2:  [97 %-100 %] 100 % (05/20 1917)  Weight change:  Filed Weights   09/05/18 0500  Weight: 128.2 kg    Intake/Output: I/O last 3 completed shifts: In: 600 [P.O.:600] Out: -    Intake/Output this shift:  No intake/output data recorded.  Alert pleasant gentleman obese nondistressed CVS- RRR no elevated JVP prosthetic heart sound RS- CTA ABD- BS present soft non-distended EXT- no edema left AV fistula with thrill and  bruit   Basic Metabolic Panel: Recent Labs  Lab 09/01/18 0737 09/04/18 0715  NA 134* 135  K 3.8 3.6  CL 93* 97*  CO2 25 24  GLUCOSE 68* 70  BUN 40* 55*  CREATININE 6.22* 7.38*  CALCIUM 8.2* 8.0*  PHOS 4.4 5.2*    Liver Function Tests: Recent Labs  Lab 09/01/18 0737 09/04/18 0715  ALBUMIN 1.4* 1.4*   No results for input(s): LIPASE, AMYLASE in the last 168 hours. No results for input(s): AMMONIA in the last 168 hours.  CBC: Recent Labs  Lab 09/01/18 0737 09/02/18 0323 09/03/18 0318 09/04/18 0325 09/06/18 0446  WBC 13.2* 10.3 9.7 9.4 10.9*  HGB 7.3* 7.0* 7.1* 7.1* 7.2*  HCT 24.0* 23.0* 23.6* 23.7* 24.3*  MCV 86.6 87.5 87.7 87.1 88.7  PLT 329 319 337 330 310    Cardiac Enzymes: No results for input(s): CKTOTAL, CKMB, CKMBINDEX, TROPONINI in the last 168 hours.  BNP: Invalid input(s): POCBNP  CBG: No results for input(s): GLUCAP in the last 168 hours.  Microbiology: Results for orders placed or performed during the hospital encounter of 08/19/18  SARS Coronavirus 2 (CEPHEID - Performed in [place] hospital lab), Hosp Order     Status: None   Collection Time: 08/19/18  5:11 PM  Result Value Ref Range Status   SARS Coronavirus 2 NEGATIVE NEGATIVE Final    Comment: (NOTE) If result is NEGATIVE SARS-CoV-2 target nucleic acids are NOT DETECTED. The SARS-CoV-2 RNA is generally detectable in upper and lower  respiratory specimens during the acute phase of  infection. The lowest  concentration of SARS-CoV-2 viral copies this assay can detect is 250  copies / mL. A negative result does not preclude SARS-CoV-2 infection  and should not be used as the sole basis for treatment or other  patient management decisions.  A negative result may occur with  improper specimen collection / handling, submission of specimen other  than nasopharyngeal swab, presence of viral mutation(s) within the  areas targeted by this assay, and inadequate number of viral copies  (<250  copies / mL). A negative result must be combined with clinical  observations, patient history, and epidemiological information. If result is POSITIVE SARS-CoV-2 target nucleic acids are DETECTED. The SARS-CoV-2 RNA is generally detectable in upper and lower  respiratory specimens dur ing the acute phase of infection.  Positive  results are indicative of active infection with SARS-CoV-2.  Clinical  correlation with patient history and other diagnostic information is  necessary to determine patient infection status.  Positive results do  not rule out bacterial infection or co-infection with other viruses. If result is PRESUMPTIVE POSTIVE SARS-CoV-2 nucleic acids MAY BE PRESENT.   A presumptive positive result was obtained on the submitted specimen  and confirmed on repeat testing.  While 2019 novel coronavirus  (SARS-CoV-2) nucleic acids may be present in the submitted sample  additional confirmatory testing may be necessary for epidemiological  and / or clinical management purposes  to differentiate between  SARS-CoV-2 and other Sarbecovirus currently known to infect humans.  If clinically indicated additional testing with an alternate test  methodology [phone]) is advised. The SARS-CoV-2 RNA is generally  detectable in upper and lower respiratory sp ecimens during the acute  phase of infection. The expected result is Negative. Fact Sheet for Patients:  StrictlyIdeas.no Fact Sheet for Healthcare Providers: BankingDealers.co.za This test is not yet approved or cleared by the [place] FDA and has been authorized for detection and/or diagnosis of SARS-CoV-2 by FDA under an Emergency Use Authorization (EUA).  This EUA will remain in effect (meaning this test can be used) for the duration of the COVID-19 declaration under Section 564(b)(1) of the Act, 21 U.S.C. section 360bbb-3(b)(1), unless the authorization is terminated or revoked  sooner. Performed at [facility], [place] [address], [place], [place] [address]     Coagulation Studies: Recent Labs    09/04/18 0325 09/05/18 0613 09/06/18 0446  LABPROT 30.4* 31.2* 32.7*  INR 3.0* 3.1* 3.3*    Urinalysis: No results for input(s): COLORURINE, LABSPEC, PHURINE, GLUCOSEU, HGBUR, BILIRUBINUR, KETONESUR, PROTEINUR, UROBILINOGEN, NITRITE, LEUKOCYTESUR in the last 72 hours.  Invalid input(s): APPERANCEUR    Imaging: No results found.   Medications:    . acetaminophen  650 mg Oral Q6H  . amiodarone  200 mg Oral Daily  . atorvastatin  80 mg Oral q1800  . Chlorhexidine Gluconate Cloth  6 each Topical Q0600  . [START ON 09/11/2018] darbepoetin (ARANESP) injection - DIALYSIS  100 mcg Intravenous Q Tue-HD  . feeding supplement (NEPRO CARB STEADY)  237 mL Oral BID BM  . feeding supplement (PRO-STAT SUGAR FREE 64)  30 mL Oral TID  . lanthanum  1,000 mg Oral TID PC  . midodrine  15 mg Oral TID WC  . multivitamin  1 tablet Oral QHS  . Warfarin - Pharmacist Dosing Inpatient   Does not apply q1800   acetaminophen, alum & mag hydroxide-simeth, bisacodyl, diphenhydrAMINE, guaiFENesin-dextromethorphan, lanthanum, oxyCODONE, polyethylene glycol, prochlorperazine **OR** prochlorperazine **OR** prochlorperazine, sodium chloride flush, traMADol, traZODone  Assessment/ Plan:  ESRD-Tuesday Thursday dialysis currently dialyzes at [facility] unit.  He was admitted to [facility] for rehabilitation after prolonged hospitalization.  He will receive dialysis 09/06/2018  ANEMIA-patient has received IV iron and darbepoetin.  Acute blood loss anemia secondary to perinephric hematoma.  Status post embolization.  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  Continues on binders that are non-calcium based  HTN/VOL-congestive heart failure with ejection fraction 30% midodrine 15 mg 3 times daily for blood pressure support.  About 1 L of fluid removed 09/01/2018  Coronary  artery disease status status post CABG 2019 history of systolic heart failure.  Hyperlipidemia on statins  AVR with admission INR supratherapeutic retroperitoneal bleed  Recurrent left perinephric hematoma with supratherapeutic INR status post ablation  Atrial fibrillation/AVR continues on amiodarone and anticoagulation.  INR being followed closely   LOS: 2 Sherril Croon @TODAY @9 :08 AM

## 2018-09-06 NOTE — Progress Notes (Signed)
DBIV consult: CBC drawn in HD.

## 2018-09-06 NOTE — Progress Notes (Signed)
[place] PHYSICAL MEDICINE & REHABILITATION PROGRESS NOTE   Subjective/Complaints: Pt is sore, tired from therapies yesterday. Still doesn't have appetite. Epigastric pain noted  ROS: Patient denies fever, rash, sore throat, blurred vision, nausea, vomiting, diarrhea, cough, shortness of breath or chest pain, headache, or mood change.   Objective:   No results found. Recent Labs    09/04/18 0325 09/06/18 0446  WBC 9.4 10.9*  HGB 7.1* 7.2*  HCT 23.7* 24.3*  PLT 330 310   Recent Labs    09/04/18 0715  NA 135  K 3.6  CL 97*  CO2 24  GLUCOSE 70  BUN 55*  CREATININE 7.38*  CALCIUM 8.0*    Intake/Output Summary (Last 24 hours) at 09/06/2018 1013 Last data filed at 09/06/2018 0900 Gross per 24 hour  Intake 480 ml  Output -  Net 480 ml     Physical Exam: Vital Signs Blood pressure 101/69, pulse 87, temperature 98.1 F (36.7 C), temperature source Oral, resp. rate 17, weight 128.2 kg, SpO2 100 %. Constitutional: No distress . Vital signs reviewed. HEENT: EOMI, oral membranes moist Neck: supple Cardiovascular: RRR without murmur. No JVD    Respiratory: CTA Bilaterally without wheezes or rales. Normal effort    GI: BS +, epigastric discomfort, non-distended  Musculoskeletal:  General: No edema.  Comments: Trace LE edema bilaterally. Neurological: He is alertand oriented to person, place, and time. Nocranial nerve deficit.Coordinationnormal.  UE 5/5. LE: 3/5 HF, 3+ KE and 4/5 ADF/PF bilaterally. No focal sensory findings---stable exam Skin: Skin iswarmand dry.  Psychiatric: pleasant    Assessment/Plan: 1. Functional deficits secondary to debility which require 3+ hours per day of interdisciplinary therapy in a comprehensive inpatient rehab setting.  Physiatrist is providing close team supervision and 24 hour management of active medical problems listed below.  Physiatrist and rehab team continue to assess barriers to discharge/monitor patient  progress toward functional and medical goals  Care Tool:  Bathing    Body parts bathed by patient: Right arm, Left arm, Chest, Abdomen, Front perineal area, Face   Body parts bathed by helper: Buttocks, Right upper leg, Left upper leg, Right lower leg, Left lower leg     Bathing assist Assist Level: Moderate Assistance - Patient 50 - 74%     Upper Body Dressing/Undressing Upper body dressing   What is the patient wearing?: Hospital gown only    Upper body assist Assist Level: Minimal Assistance - Patient > 75%    Lower Body Dressing/Undressing Lower body dressing      What is the patient wearing?: Pants, Incontinence brief     Lower body assist Assist for lower body dressing: Total Assistance - Patient < 25%     Toileting Toileting Toileting Activity did not occur Landscape architect and hygiene only): N/A (no void or bm)(HD pt)  Toileting assist Assist for toileting: Dependent - Patient 0%     Transfers Chair/bed transfer  Transfers assist  Chair/bed transfer activity did not occur: Refused  Chair/bed transfer assist level: 2 Helpers     Locomotion Ambulation   Ambulation assist   Ambulation activity did not occur: Safety/medical concerns          Walk 10 feet activity   Assist  Walk 10 feet activity did not occur: Safety/medical concerns        Walk 50 feet activity   Assist Walk 50 feet with 2 turns activity did not occur: Safety/medical concerns         Walk 150 feet activity  Assist Walk 150 feet activity did not occur: Safety/medical concerns         Walk 10 feet on uneven surface  activity   Assist Walk 10 feet on uneven surfaces activity did not occur: Safety/medical concerns         Wheelchair     Assist     Wheelchair activity did not occur: Refused         Wheelchair 50 feet with 2 turns activity    Assist    Wheelchair 50 feet with 2 turns activity did not occur: Refused        Wheelchair 150 feet activity     Assist Wheelchair 150 feet activity did not occur: Refused        Medical Problem List and Plan: 1.Functional and mobility deficitssecondary to debility after multiple medical issues  -recent hx of bleeding peri-renal cyst complicated elevated INR --Continue CIR therapies including PT, OT  2. Antithrombotics: -DVT/anticoagulation:Pharmaceutical:Coumadin INR 3.1, adjust per pharm protocol -antiplatelet therapy: N/A 3. Pain Management:PRN oxycodone--may Need to premedicate prior to therapy.  Ice prn to abdomen/flank.  4. Mood:LCSW to follow for evaluation and support. -antipsychotic agents: N/A 5. Neuropsych: This patientiscapable of making decisions on his own behalf. 6. Skin/Wound Care:Routine pressure relief measures. 7. Fluids/Electrolytes/Nutrition:Strict I/O with daily weights. Continue to offer nephro supplements and pro-stat.  -communicated with RD re: poor po intake, has been going on for some time now  -will initiate megace trial  -reviewed with the patient that he may need supplemental/enteral nutrition 8. Anemia of chronic disease: Received Feraheme 5/18 and on Aranesp weekly. . 9. CAD s/p AVR/ICM s/p OIZ:TIWPYKD HR BID--continue amiodarone daily. On coumadin-- 10. Polycystic kidney disease/ESRD:HD scheduled at end of the day to allow full participation in therapies 11. Chronic hypotension: On midodrine tid--SBP runs in 90's.     LOS: 2 days A FACE TO [place] 09/06/2018, 10:13 AM

## 2018-09-07 ENCOUNTER — Inpatient Hospital Stay (HOSPITAL_COMMUNITY): Payer: Medicare Other

## 2018-09-07 LAB — CBC
HCT: 24.4 % — ABNORMAL LOW (ref 39.0–52.0)
Hemoglobin: 7.3 g/dL — ABNORMAL LOW (ref 13.0–17.0)
MCH: 25.9 pg — ABNORMAL LOW (ref 26.0–34.0)
MCHC: 29.9 g/dL — ABNORMAL LOW (ref 30.0–36.0)
MCV: 86.5 fL (ref 80.0–100.0)
Platelets: 320 10*3/uL (ref 150–400)
RBC: 2.82 MIL/uL — ABNORMAL LOW (ref 4.22–5.81)
RDW: 17.9 % — ABNORMAL HIGH (ref 11.5–15.5)
WBC: 9.3 10*3/uL (ref 4.0–10.5)
nRBC: 0 % (ref 0.0–0.2)

## 2018-09-07 LAB — PROTIME-INR
INR: 3.2 — ABNORMAL HIGH (ref 0.8–1.2)
Prothrombin Time: 32.2 seconds — ABNORMAL HIGH (ref 11.4–15.2)

## 2018-09-07 MED ORDER — OXYCODONE HCL 5 MG PO TABS
10.0000 mg | ORAL_TABLET | ORAL | Status: DC | PRN
  Administered 2018-09-08 – 2018-09-18 (×19): 10 mg via ORAL
  Filled 2018-09-07 (×18): qty 2

## 2018-09-07 MED ORDER — CHLORHEXIDINE GLUCONATE CLOTH 2 % EX PADS
6.0000 | MEDICATED_PAD | Freq: Every day | CUTANEOUS | Status: DC
  Administered 2018-09-08 – 2018-09-18 (×7): 6 via TOPICAL

## 2018-09-07 NOTE — Progress Notes (Signed)
[place] for warfarin Indication: Mechanical valve  No Known Allergies  Patient Measurements: Weight: 279 lb 15.8 oz (127 kg)  Vital Signs:    Labs: Recent Labs    09/05/18 0613  09/06/18 0446 09/06/18 1305 09/06/18 1307 09/07/18 0438  HGB  --    < > 7.2* 7.6*  --  7.3*  HCT  --   --  24.3* 25.8*  --  24.4*  PLT  --   --  310 370  --  320  LABPROT 31.2*  --  32.7*  --   --  32.2*  INR 3.1*  --  3.3*  --   --  3.2*  CREATININE  --   --   --   --  7.07*  --    < > = values in this interval not displayed.    Estimated Creatinine Clearance: 16.9 mL/min (A) (by C-G formula based on SCr of 7.07 mg/dL (H)).  Assessment: 45 YOM on warfarin PTA for mech aortic valve. Recent admission 4/15-4/21 forsupratherapeutic INR andruptured hemorrhagic renal cyst. Readmitted 5/3 with abdominal pain, gross hematuria, and supratherapeutic INR. CT abdomen at OSH showed increase in size of perinephric hematoma. Patient s/p embolization on 5/13. Last dose warfarin PTA was on 5/2 and patient is s/p 8 mg total of Vitamin K during hospitalization. INR goal has been decreased to 2-2.5 per MD. Warfarin resumed 5/14.    INR today remains SUPRAtherapeutic at 3.2  Hgb 7.3 (low/stable), Pltc WNL  Gross blood in stool yesterday per notes  Goal of Therapy:  INR 2-2.5 Monitor platelets by anticoagulation protocol: Yes   Plan:  - Hold warfarin dose today - Daily PT/INR - Follow CBC and for s/sx of bleeding    Lindell Spar, PharmD, BCPS Clinical Pharmacist Please utilize Amion for appropriate phone number to reach the unit pharmacist ([place]) 09/07/2018 1:12 PM

## 2018-09-07 NOTE — Progress Notes (Signed)
Low BP, pt is asymptomatic. MD on call notified, no new orders, recommends to monitor pt.

## 2018-09-07 NOTE — IPOC Note (Signed)
Overall Plan of Care [facility]) Patient Details Name: HARKIRAT OROZCO MRN: [id number] DOB: Dec 17, 1965  Admitting Diagnosis: <principal problem not specified>  Hospital Problems: Active Problems:   Debility     Functional Problem List: Nursing Bowel, Endurance, Nutrition, Pain, Safety, Skin Integrity  PT Balance, Endurance, Pain, Safety  OT Balance, Endurance, Pain, Skin Integrity  SLP    TR         Basic ADL's: OT Bathing, Dressing, Toileting     Advanced  ADL's: OT       Transfers: PT Bed to Chair, Bed Mobility, Car, Furniture, Floor  OT Toilet, Metallurgist: PT Ambulation, Emergency planning/management officer, Stairs     Additional Impairments: OT None  SLP        TR      Anticipated Outcomes Item Anticipated Outcome  Self Feeding n/a  Swallowing      Basic self-care  setup   Toileting  setup    Bathroom Transfers setup  Bowel/Bladder  min assist  Transfers  supervision  Locomotion  min assist w/ LRAD   Communication     Cognition     Pain  less that 4 on scale of 0-10  Safety/Judgment  remain safe with min assist   Therapy Plan: PT Intensity: Minimum of 1-2 x/day ,45 to 90 minutes PT Frequency: 5 out of 7 days PT Duration Estimated Length of Stay: 10-14 days OT Intensity: Minimum of 1-2 x/day, 45 to 90 minutes OT Frequency: 5 out of 7 days OT Duration/Estimated Length of Stay: 10-14 days     Due to the current state of emergency, patients may not be receiving their 3-hours of Medicare-mandated therapy.   Team Interventions: Nursing Interventions Patient/Family Education, Bowel Management, Pain Management, Medication Management, Psychosocial Support, Skin Care/Wound Management  PT interventions Ambulation/gait training, Discharge planning, DME/adaptive equipment instruction, Functional mobility training, Pain management, Psychosocial support, UE/LE Strength taining/ROM, Therapeutic Activities, Splinting/orthotics, Wheelchair  propulsion/positioning, UE/LE Coordination activities, Therapeutic Exercise, Skin care/wound management, Stair training, Patient/family education, Neuromuscular re-education, Functional electrical stimulation, Disease management/prevention, Academic librarian, Training and development officer  OT Interventions Training and development officer, Discharge planning, Pain management, Self Care/advanced ADL retraining, Therapeutic Activities, UE/LE Coordination activities, Functional mobility training, Patient/family education, Skin care/wound managment, Community reintegration, Engineer, drilling, Psychosocial support, UE/LE Strength taining/ROM  SLP Interventions    TR Interventions    SW/CM Interventions Discharge Planning, Psychosocial Support, Patient/Family Education   Barriers to Discharge MD  Medical stability  Nursing      PT Medical stability, Inaccessible home environment 6 steps to enter home  OT      SLP      SW       Team Discharge Planning: Destination: PT-Home ,OT- Home , SLP-  Projected Follow-up: PT-Home health PT, OT-  Home health OT, SLP-  Projected Equipment Needs: PT-To be determined, OT- To be determined, SLP-  Equipment Details: PT-has RW, manual w/c, and cane , OT-  Patient/family involved in discharge planning: PT- Patient,  OT-Patient, SLP-   MD ELOS: 10-14 days Medical Rehab Prognosis:  Excellent Assessment: The patient has been admitted for CIR therapies with the diagnosis of debility after multiple medical. The team will be addressing functional mobility, strength, stamina, balance, safety, adaptive techniques and equipment, self-care, bowel and bladder mgt, patient and caregiver education, pain mgt, NMR, community reentry. Goals have been set at min assist to set up with self-care and supervision to min assist with basic mobility tasks.   Due to the current state of  emergency, patients may not be receiving their 3 hours per day of Medicare-mandated  therapy.    Meredith Staggers, MD, FAAPMR      See Team Conference Notes for weekly updates to the plan of care

## 2018-09-07 NOTE — Progress Notes (Signed)
Physical Therapy Session Note  Patient Details  Name: [name] [name] MRN: [id number] Date of Birth: May 29, 1965  Today's Date: 09/07/2018 PT Individual Time: 0905-1005 PT Individual Time Calculation (min): 60 min   Short Term Goals: Week 1:  PT Short Term Goal 1 (Week 1): Pt will initiate OOB mobility PT Short Term Goal 2 (Week 1): Pt will initiate gait training PT Short Term Goal 3 (Week 1): Pt will perform sit<>stand w/ max assist x1 PT Short Term Goal 4 (Week 1): Pt will tolerate 30 min of upright activity w/o increase in fatigue  Skilled Therapeutic Interventions/Progress Updates:     Patient in bed upon PT arrival. Patient alert and agreeable to PT session.  Patient reported abdominal pain this morning, but improved dizziness from yesterday. PT palpated patients abdomen and found intermittent firmness in lower R and L quadrants and patient reported 10/10 pain in these areas upon palpation. RN was called in and palpated and listened to patient's abdomen and plans to inform the MD. Continued with PT to patient's tolerance to abdominal pain while monitoring vitals throughout session.   Therapeutic Activity: Bed Mobility: Patient performed supine to/from sit with min A for trunk support. Provided verbal cues for rolling to reduce strain on abdomen. Transfers: Patient performed a side scoot x1 to the TIS w/c and a slide board transfer x1 back to the bed with max A of 1 person, recommend use of the slide board in his room for safety. Provided verbal cues for scooting technique, head/hips relationship, board placement, and hand placment. Patient was encouraged to sit up in the w/c as long as he could tolerate, but paitent complained that the cushion was hurting his sacrum and back and he needed to get back to bed at the end of the session. Tolerated 20 minutes in the chair during session, will try a different w/c cushion for improved comfort when able.   Patient in bed at end of session with breaks  locked, bed alarm set, and all needs within reach. Provided the patient with a bariatric RW, left in his room, for when patient is able to attempt standing and/or initiate gait training.    Therapy Documentation Precautions:  Precautions Precautions: Fall Precaution Comments: pain along flanks and abdomen; ice as needed Restrictions Weight Bearing Restrictions: No General:   Vital Signs:  Supine: BP 94/60 HR 90 Sitting: BP 94/61 HR 90 Sitting after transfer: BP 87/54 HR 124 Supine at end of session: BP 96/61 HR 109  Pain: Pain Assessment Pain Scale: 0-10 Pain Score: 2  Pain Location: stomach Pain Intervention: reposition; distraction (RN provided pain meds during session)   Therapy/Group: Individual Therapy  Alnita Aybar L Janeisha Ryle PT, DPT  09/07/2018, 12:59 PM

## 2018-09-07 NOTE — Progress Notes (Signed)
[facility] PHYSICAL MEDICINE & REHABILITATION PROGRESS NOTE   Subjective/Complaints: Had a fair night. Was tired from therapies. Thinks the megace might be helping appetite although he didn't receive a tray yet when I was in this morning. Gross blood in stool yesterday. Pt was not aware  ROS: Patient denies fever, rash, sore throat, blurred vision, nausea, vomiting, diarrhea, cough, shortness of breath or chest pain,  headache, or mood change.    Objective:   No results found. Recent Labs    09/06/18 1305 09/07/18 0438  WBC 13.4* 9.3  HGB 7.6* 7.3*  HCT 25.8* 24.4*  PLT 370 320   Recent Labs    09/06/18 1307  NA 136  K 3.4*  CL 100  CO2 25  GLUCOSE 95  BUN 50*  CREATININE 7.07*  CALCIUM 7.8*    Intake/Output Summary (Last 24 hours) at 09/07/2018 1121 Last data filed at 09/07/2018 0820 Gross per 24 hour  Intake 240 ml  Output 2000 ml  Net -1760 ml     Physical Exam: Vital Signs Blood pressure (!) 88/51, pulse 91, temperature 98 F (36.7 C), temperature source Oral, resp. rate 18, weight 127 kg, SpO2 98 %. Constitutional: No distress . Vital signs reviewed. HEENT: EOMI, oral membranes moist Neck: supple Cardiovascular: RRR without murmur. No JVD    Respiratory: CTA Bilaterally without wheezes or rales. Normal effort    GI: BS +, epigastric area tender to palp  Musculoskeletal:  General: No edema.  Comments: Trace LE edema bilaterally remains Neurological: He is alertand oriented to person, place, and time. Nocranial nerve deficit.Coordinationnormal.  UE 5/5. LE: 3/5 HF, 3+ KE and 4/5 ADF/PF bilaterally. No focal sensory findings--no changes Skin: Skin iswarmand dry.  Psychiatric: pleasant    Assessment/Plan: 1. Functional deficits secondary to debility which require 3+ hours per day of interdisciplinary therapy in a comprehensive inpatient rehab setting.  Physiatrist is providing close team supervision and 24 hour management of active  medical problems listed below.  Physiatrist and rehab team continue to assess barriers to discharge/monitor patient progress toward functional and medical goals  Care Tool:  Bathing    Body parts bathed by patient: Right arm, Left arm, Chest, Abdomen, Front perineal area, Face   Body parts bathed by helper: Buttocks, Right upper leg, Left upper leg, Right lower leg, Left lower leg     Bathing assist Assist Level: Moderate Assistance - Patient 50 - 74%     Upper Body Dressing/Undressing Upper body dressing   What is the patient wearing?: Hospital gown only    Upper body assist Assist Level: Minimal Assistance - Patient > 75%    Lower Body Dressing/Undressing Lower body dressing      What is the patient wearing?: Pants, Incontinence brief     Lower body assist Assist for lower body dressing: Total Assistance - Patient < 25%     Toileting Toileting Toileting Activity did not occur Landscape architect and hygiene only): N/A (no void or bm)(HD pt)  Toileting assist Assist for toileting: Dependent - Patient 0%     Transfers Chair/bed transfer  Transfers assist  Chair/bed transfer activity did not occur: Refused  Chair/bed transfer assist level: 2 Helpers     Locomotion Ambulation   Ambulation assist   Ambulation activity did not occur: Safety/medical concerns          Walk 10 feet activity   Assist  Walk 10 feet activity did not occur: Safety/medical concerns        Walk  50 feet activity   Assist Walk 50 feet with 2 turns activity did not occur: Safety/medical concerns         Walk 150 feet activity   Assist Walk 150 feet activity did not occur: Safety/medical concerns         Walk 10 feet on uneven surface  activity   Assist Walk 10 feet on uneven surfaces activity did not occur: Safety/medical concerns         Wheelchair     Assist     Wheelchair activity did not occur: Refused         Wheelchair 50 feet with 2  turns activity    Assist    Wheelchair 50 feet with 2 turns activity did not occur: Refused       Wheelchair 150 feet activity     Assist Wheelchair 150 feet activity did not occur: Refused        Medical Problem List and Plan: 1.Functional and mobility deficitssecondary to debility after multiple medical issues  -recent hx of bleeding peri-nephric cyst complicated elevated INR --Continue CIR therapies including PT, OT  2. Antithrombotics: -DVT/anticoagulation:Pharmaceutical:Coumadin INR 3.2, adjust per pharm protocol -antiplatelet therapy: N/A 3. Pain Management:PRN oxycodone--may Need to premedicate prior to therapy.  Ice prn to abdomen/flank.  4. Mood:LCSW to follow for evaluation and support. -antipsychotic agents: N/A 5. Neuropsych: This patientiscapable of making decisions on his own behalf. 6. Skin/Wound Care:Routine pressure relief measures. 7. Fluids/Electrolytes/Nutrition:Strict I/O with daily weights. Continue to offer nephro supplements and pro-stat.  -appreciate RD input  -continue megace trial  -I have previously reviewed with the patient that he may need supplemental/enteral nutrition if intake doesn't pick up 8. Anemia of chronic disease: Received Feraheme 5/18 and on Aranesp weekly. . 9. CAD s/p AVR/ICM s/p XMD:[id number] HR BID--continue amiodarone daily. On coumadin-- 10. Polycystic kidney disease/ESRD:HD scheduled at end of the day to allow full participation in therapies 11. Chronic hypotension: On midodrine tid--SBP runs in 90's. 12. Gross blood in stool.   -INR supratherapeutic  -HGB stable from yesterday  -continue to monitor closely     LOS: 3 days A FACE TO FACE EVALUATION WAS PERFORMED  Meredith Staggers 09/07/2018, 11:21 AM

## 2018-09-07 NOTE — Progress Notes (Signed)
Occupational Therapy Session Note  Patient Details  Name: [name] [name] MRN: [id number] Date of Birth: 04-06-1966  Today's Date: 09/07/2018 OT Individual Time: 1300-1330 OT Individual Time Calculation (min): 30 min  and Today's Date: 09/07/2018 OT Missed Time: 15 Minutes Missed Time Reason: Pain   Short Term Goals: Week 1:  OT Short Term Goal 1 (Week 1): Pt will tolerate sitting upright out of bed for 3 hrs a day. OT Short Term Goal 2 (Week 1): Pt will perform sit to stand with mod A +1 for LB dressing  OT Short Term Goal 3 (Week 1): Pt will perform lateral transfers (scoot or squat) with min A  OT Short Term Goal 4 (Week 1): Pt will LB dressing with mod A (sit to stand).  Skilled Therapeutic Interventions/Progress Updates:    Pt resting in bed upon arrival with eyes closed.  Pt stated that he closes his eyes to help with pain.  Pt again voiced his frustration with not being able to get answer regarding pain and tenderness in abdomen. OT intervention with bed mobility and repositioning in bed for comfort.  Pt missed 15 mins skilled OT services 2/2 pain.  Pt stated he ate half his sandwich but declined any snacks. Pt remained in bed with all needs within reach and bed alarm activated.   Therapy Documentation Precautions:  Precautions Precautions: Fall Precaution Comments: pain along flanks and abdomen; ice as needed Restrictions Weight Bearing Restrictions: No General: General OT Amount of Missed Time: 15 Minutes   Pain: Pt c/o 8/10 abdominal pain, repositioned     Therapy/Group: Individual Therapy  [name] 09/07/2018, 1:45 PM

## 2018-09-07 NOTE — Progress Notes (Signed)
Social Work  Social Work Assessment and Plan  Patient Details  Name: [name] [name] MRN: [id number] Date of Birth: [birth year]  Today's Date: 09/07/2018  Problem List:  Patient Active Problem List   Diagnosis Date Noted  . Debility 09/04/2018  . Perinephric hematoma 08/20/2018  . Supratherapeutic INR 08/20/2018  . Gross hematuria 08/19/2018  . Acute blood loss anemia 08/02/2018  . Renal hematoma, left, initial encounter 08/01/2018  . 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 ICD (implantable cardioverter-defibrillator) pocket hematoma 01/01/2018  . Encounter for therapeutic drug monitoring 09/14/2017  . S/P aortic valve replacement with bileaflet mechanical valve 09/01/2017  . S/P CABG x 1 09/01/2017  . Coronary artery disease involving native coronary artery of native heart without angina pectoris   . 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 Abnormal echocardiogram   . Mitral regurgitation   . Severe aortic insufficiency   . Acute on chronic combined systolic and diastolic heart failure ([place])   . Systolic CHF ([place]) 34/19/6222  . 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 Dermatitis 04/06/2016  . End stage renal disease ([place]) 12/22/2011  . Sinus tachycardia   . ESRD (end stage renal disease) ([place])   . Orthostatic hypotension   . Chest discomfort   . Morbid obesity ([place])   . Syncope   . Aneurysm [facility])    Past Medical History:  Past Medical History:  Diagnosis Date  . Aneurysm ([place])     Right arm fistula 3 aneurysms 2011,   plans to have a new procedure  . Angina   . Chest discomfort   . CHF (congestive heart failure) ([place])   . Coronary artery disease   . Coronary artery disease involving native coronary artery of native heart without angina pectoris   . Dialysis patient ([place])   . Dysrhythmia   . ESRD (end stage renal disease) ([place])    on hemodialysis [id number]  . Hypertension   . Leg pain   . Mitral regurgitation   . Morbid  obesity ([place])   . Orthostatic hypotension   . Overweight(278.02)   . Renal insufficiency   . 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 Severe aortic insufficiency   . Sinus tachycardia   . Syncope     positional after dialysis... 2008   Past Surgical History:  Past Surgical History:  Procedure Laterality Date  . A/V FISTULAGRAM Left 06/30/2016   Procedure: A/V Fistulagram;  Surgeon: Algernon Huxley, MD;  Location: [place] CV LAB;  Service: Cardiovascular;  Laterality: Left;  . AORTIC VALVE REPLACEMENT N/A 09/01/2017   Procedure: AORTIC VALVE REPLACEMENT (AVR);  Surgeon: [name], MD;  Location: [place];  Service: Open Heart Surgery;  Laterality: N/A;  . ARTERIOVENOUS GRAFT PLACEMENT    . AV FISTULA PLACEMENT    . AV FISTULA PLACEMENT Left 11/18/2015   Procedure: ARTERIOVENOUS (AV) FISTULA CREATION ( RADIOCEPHALIC );  Surgeon: Algernon Huxley, MD;  Location: [facility];  Service: Vascular;  Laterality: Left;  . AV FISTULA PLACEMENT Left 03/23/2016   Procedure: ARTERIOVENOUS (AV) FISTULA CREATION ( REVISION );  Surgeon: Algernon Huxley, MD;  Location: [facility];  Service: Vascular;  Laterality: Left;  . [place] REMOVAL Left 03/23/2016   Procedure: REMOVAL OF ARTERIOVENOUS GORETEX GRAFT ([place]);  Surgeon: Algernon Huxley, MD;  Location: [facility];  Service: Vascular;  Laterality: Left;  . CENTRAL LINE INSERTION Right 09/01/2017   Procedure: FLOROSCOPY GUIDED PLACEMENT OF RIGHT FEMORAL CENTRAL LINE TIMES TWO  AND PLACEMENT OF SWAN GANZ CATHETER;  Surgeon: [name], MD;  Location: [place];  Service: Open Heart Surgery;  Laterality: Right;  . CORONARY ARTERY BYPASS GRAFT N/A 09/01/2017   Procedure: CORONARY ARTERY BYPASS GRAFTING (CABG) X 1, USING RIGHT GREATER SAPHENOUS VEIN HARVESTED ENDOSCOPICALLY;  Surgeon: [name], MD;  Location: [place];  Service: Open Heart  Surgery;  Laterality: N/A;  . DIALYSIS/PERMA CATHETER REMOVAL N/A 08/01/2016   Procedure: Dialysis/Perma Catheter Removal;  Surgeon: Algernon Huxley, MD;  Location: [place] CV LAB;  Service: Cardiovascular;  Laterality: N/A;  . HERNIA REPAIR    . INSERTION OF DIALYSIS CATHETER  03/01/2011   Procedure: INSERTION OF DIALYSIS CATHETER;  Surgeon: Elam Dutch, MD;  Location: [place];  Service: Vascular;  Laterality: Left;  Exchange of Dialysis Catheter to 27cm 15Fr. Arrow Catheter  . INSERTION OF DIALYSIS CATHETER  09/01/2017   Procedure: PLACEMENT OF TRIALYSIS SHORT TERM DIALYSIS CATHETER;  Surgeon: [name], MD;  Location: [place];  Service: Open Heart Surgery;;  . IR EMBO ART  VEN HEMORR LYMPH EXTRAV  INC GUIDE ROADMAPPING  08/29/2018  . IR FLUORO GUIDE CV LINE RIGHT  08/27/2018  . IR RENAL SUPRASEL UNI S&I MOD SED  08/29/2018  . IR US GUIDE VASC ACCESS RIGHT  08/27/2018  . IR US GUIDE VASC ACCESS RIGHT  08/29/2018  . MULTIPLE EXTRACTIONS WITH ALVEOLOPLASTY N/A 08/07/2017   Procedure: Extraction of tooth #2, 7,8,13,14,15,17, and 31 with alveoloplasty and gross debridement of remaining teeth;  Surgeon: [name], DDS;  Location: [place];  Service: Oral Surgery;  Laterality: N/A;  . PERIPHERAL VASCULAR CATHETERIZATION N/A 09/01/2014   Procedure: A/V Shuntogram/Fistulagram;  Surgeon: Algernon Huxley, MD;  Location: [place] CV LAB;  Service: Cardiovascular;  Laterality: N/A;  . PERIPHERAL VASCULAR CATHETERIZATION Right 09/01/2014   Procedure: Thrombectomy;  Surgeon: Algernon Huxley, MD;  Location: [place] CV LAB;  Service: Cardiovascular;  Laterality: Right;  . PERIPHERAL VASCULAR CATHETERIZATION Right 09/01/2014   Procedure: A/V Shunt Intervention;  Surgeon: Algernon Huxley, MD;  Location: [place] CV LAB;  Service: Cardiovascular;  Laterality: Right;  . PERIPHERAL VASCULAR CATHETERIZATION N/A 09/17/2014   Procedure: A/V Shuntogram/Fistulagram;  Surgeon: Algernon Huxley, MD;  Location: [place] CV LAB;  Service: Cardiovascular;  Laterality: N/A;  . PERIPHERAL VASCULAR CATHETERIZATION Right 10/17/2014   Procedure: Thrombectomy;  Surgeon: Katha Cabal, MD;  Location: [place] CV LAB;  Service: Cardiovascular;  Laterality: Right;  . PERIPHERAL VASCULAR CATHETERIZATION N/A 10/17/2014   Procedure: A/V Shuntogram/Fistulagram;  Surgeon: Katha Cabal, MD;  Location: [place] CV LAB;  Service: Cardiovascular;  Laterality: N/A;  . PERIPHERAL VASCULAR CATHETERIZATION N/A 10/17/2014   Procedure: A/V Shunt Intervention;  Surgeon: Katha Cabal, MD;  Location: [place] CV LAB;  Service: Cardiovascular;  Laterality: N/A;  . PERIPHERAL VASCULAR CATHETERIZATION Right 11/04/2014   Procedure: Thrombectomy;  Surgeon: Katha Cabal, MD;  Location: [place] CV LAB;  Service: Cardiovascular;  Laterality: Right;  . PERIPHERAL VASCULAR CATHETERIZATION Left 11/04/2014   Procedure: Visceral Venography;  Surgeon: Katha Cabal, MD;  Location: [place] CV LAB;  Service: Cardiovascular;  Laterality: Left;  . PERIPHERAL VASCULAR CATHETERIZATION Right 11/27/2014   Procedure: A/V Shuntogram/Fistulagram;  Surgeon: Algernon Huxley, MD;  Location: [place] CV LAB;  Service: Cardiovascular;  Laterality: Right;  . PERIPHERAL VASCULAR CATHETERIZATION Right 11/27/2014   Procedure: A/V Shunt Intervention;  Surgeon: Algernon Huxley, MD;  Location: [place] [facility];  Service: Cardiovascular;  Laterality: Right;  . PERIPHERAL VASCULAR CATHETERIZATION Right 12/31/2014   Procedure: Thrombectomy;  Surgeon: Algernon Huxley, MD;  Location: [place] CV LAB;  Service: Cardiovascular;  Laterality: Right;  . PERIPHERAL VASCULAR CATHETERIZATION Right 01/12/2015   Procedure: A/V Shuntogram/Fistulagram;  Surgeon: Algernon Huxley, MD;  Location: [place] CV LAB;  Service: Cardiovascular;  Laterality: Right;  . PERIPHERAL VASCULAR CATHETERIZATION N/A 01/12/2015   Procedure: A/V Shunt Intervention;   Surgeon: Algernon Huxley, MD;  Location: [place] CV LAB;  Service: Cardiovascular;  Laterality: N/A;  . PERIPHERAL VASCULAR CATHETERIZATION Right 01/28/2015   Procedure: Thrombectomy;  Surgeon: Algernon Huxley, MD;  Location: [place] CV LAB;  Service: Cardiovascular;  Laterality: Right;  . PERIPHERAL VASCULAR CATHETERIZATION N/A 06/08/2015   Procedure: A/V Shuntogram/Fistulagram;  Surgeon: Algernon Huxley, MD;  Location: [place] [facility];  Service: Cardiovascular;  Laterality: N/A;  . PERIPHERAL VASCULAR CATHETERIZATION N/A 06/08/2015   Procedure: A/V Shunt Intervention;  Surgeon: Algernon Huxley, MD;  Location: [place] CV LAB;  Service: Cardiovascular;  Laterality: N/A;  . PERIPHERAL VASCULAR CATHETERIZATION Right 07/06/2015   Procedure: A/V Shuntogram/Fistulagram;  Surgeon: Algernon Huxley, MD;  Location: [place] [facility];  Service: Cardiovascular;  Laterality: Right;  . PERIPHERAL VASCULAR CATHETERIZATION N/A 07/06/2015   Procedure: A/V Shunt Intervention;  Surgeon: Algernon Huxley, MD;  Location: [place] [facility] LAB;  Service: Cardiovascular;  Laterality: N/A;  . PERIPHERAL VASCULAR CATHETERIZATION N/A 08/04/2015   Procedure: graft declot;  Surgeon: Katha Cabal, MD;  Location: [place] CV LAB;  Service: Cardiovascular;  Laterality: N/A;  . PERIPHERAL VASCULAR CATHETERIZATION Right 08/25/2015   Procedure: A/V Shuntogram/Fistulagram;  Surgeon: Katha Cabal, MD;  Location: [place] [facility];  Service: Cardiovascular;  Laterality: Right;  . PERIPHERAL VASCULAR CATHETERIZATION N/A 11/04/2015   Procedure: Dialysis/Perma Catheter Insertion;  Surgeon: Algernon Huxley, MD;  Location: [place] [facility] LAB;  Service: Cardiovascular;  Laterality: N/A;  . PERIPHERAL VASCULAR CATHETERIZATION Left 12/14/2015   Procedure: A/V Shuntogram/Fistulagram;  Surgeon: Algernon Huxley, MD;  Location: [place] CV LAB;  Service: Cardiovascular;  Laterality: Left;  . PERIPHERAL VASCULAR CATHETERIZATION N/A  12/14/2015   Procedure: A/V Shunt Intervention;  Surgeon: Algernon Huxley, MD;  Location: [place] CV LAB;  Service: Cardiovascular;  Laterality: N/A;  . PERIPHERAL VASCULAR CATHETERIZATION N/A 12/17/2015   Procedure: Dialysis/Perma Catheter Insertion;  Surgeon: Algernon Huxley, MD;  Location: [place] CV LAB;  Service: Cardiovascular;  Laterality: N/A;  . PERIPHERAL VASCULAR CATHETERIZATION Left 12/22/2015   Procedure: Dialysis/Perma Catheter Insertion;  Surgeon: Katha Cabal, MD;  Location: [place] [facility];  Service: Cardiovascular;  Laterality: Left;  . PERIPHERAL VASCULAR CATHETERIZATION Left 02/29/2016   Procedure: A/V Shuntogram/Fistulagram;  Surgeon: Algernon Huxley, MD;  Location: [place] [facility];  Service: Cardiovascular;  Laterality: Left;  . PERIPHERAL VASCULAR CATHETERIZATION N/A 02/29/2016   Procedure: A/V Shunt Intervention;  Surgeon: Algernon Huxley, MD;  Location: [place] [facility] LAB;  Service: Cardiovascular;  Laterality: N/A;  . POCKET REVISION/RELOCATION N/A 01/01/2018   Procedure: POCKET REVISION/RELOCATION;  Surgeon: Evans Lance, MD;  Location: [place] [facility] LAB;  Service: Cardiovascular;  Laterality: N/A;  . right arm graft     for dyalisis  . RIGHT/LEFT HEART CATH AND CORONARY ANGIOGRAPHY N/A  08/02/2017   Procedure: RIGHT/LEFT HEART CATH AND CORONARY ANGIOGRAPHY;  Surgeon: Larey Dresser, MD;  Location: [place] CV LAB;  Service: Cardiovascular;  Laterality: N/A;  . SUBQ ICD IMPLANT N/A 11/28/2017   Procedure: SUBQ ICD IMPLANT;  Surgeon: Evans Lance, MD;  Location: [place] [facility];  Service: Cardiovascular;  Laterality: N/A;  . SUBQ ICD REVISION N/A 02/12/2018   Procedure: SUBQ ICD REVISION(REMOVAL);  Surgeon: Evans Lance, MD;  Location: [place] [facility] LAB;  Service: Cardiovascular;  Laterality: N/A;  . TEE WITHOUT CARDIOVERSION N/A 07/31/2017   Procedure: TRANSESOPHAGEAL ECHOCARDIOGRAM (TEE);  Surgeon: Josue Hector, MD;  Location: [facility] ENDOSCOPY;   Service: Cardiovascular;  Laterality: N/A;  . TEE WITHOUT CARDIOVERSION N/A 09/01/2017   Procedure: TRANSESOPHAGEAL ECHOCARDIOGRAM (TEE);  Surgeon: [name], MD;  Location: [place];  Service: Open Heart Surgery;  Laterality: N/A;  . THROMBECTOMY    . THROMBECTOMY W/ EMBOLECTOMY  03/01/2011   Procedure: THROMBECTOMY ARTERIOVENOUS GORE-TEX GRAFT;  Surgeon: Elam Dutch, MD;  Location: [place];  Service: Vascular;  Laterality: Right;  Attempted Thrombectomy of Old  Right Upper Arm Arteriovenous gortex Graft. Insertion of new Arteriovenous Graft using 29mm x 50cm Gortex Stretch graft.   . THROMBECTOMY W/ EMBOLECTOMY  07/11/2011   Procedure: THROMBECTOMY ARTERIOVENOUS GORE-TEX GRAFT;  Surgeon: Rosetta Posner, MD;  Location: [place];  Service: Vascular;  Laterality: Right;  . UMBILICAL HERNIA REPAIR    . VENOGRAM N/A 08/23/2011   Procedure: VENOGRAM;  Surgeon: [name], MD;  Location: [facility] CATH LAB;  Service: Cardiovascular;  Laterality: N/A;   Social History:  reports that he quit smoking about 24 years ago. His smoking use included cigarettes. His smokeless tobacco use includes snuff. He reports that he does not drink alcohol or use drugs.  Family / Support Systems Marital Status: Married Patient Roles: Spouse, Parent Spouse/Significant Other: wife, Bryse Blanchette @ [phone] Children: daughter, Koren Shiver (4) living in the home and working. Anticipated Caregiver: wife Garry Heater will be primary support. Ability/Limitations of Caregiver: none, will also have help from 60 y/o daughter if needed Caregiver Availability: 24/7 Family Dynamics: Pt describes his wife and daughter as very supportive.  Wife very encouraging and hopeful he will make good progress on CIR.  Social History Preferred language: English Religion: Methodist Cultural Background: NA Read: Yes Write: Yes Employment Status: Disabled Public relations account executive Issues: None Guardian/Conservator: None - per MD, pt is capable  of making decisions on his own behalf.   Abuse/Neglect Abuse/Neglect Assessment Can Be Completed: Yes Physical Abuse: Denies Verbal Abuse: Denies Sexual Abuse: Denies Exploitation of patient/patient's resources: Denies Self-Neglect: Denies  Emotional Status Pt's affect, behavior and adjustment status: Pt sitting up in bed, pleasant, however, c/o of fatigue and poor appetite.  He admits he is frustrated with ongoing health issues.  He denies any significant emotional distress.  Will monitor and refer for neuropsychology as indicated. Recent Psychosocial Issues: none Psychiatric History: none Substance Abuse History: none  Patient / Family Perceptions, Expectations & Goals Pt/Family understanding of illness & functional limitations: Pt and wife with good, general understanding of his current level of debility due to medical issues and need for CIR. Premorbid pt/family roles/activities: Pt was independent overall. Anticipated changes in roles/activities/participation: Per goals of supervision to minimal assistance, wife and daughter may need to provide some physical assistance. Pt/family expectations/goals: "I just hope I'll get my appetite back.  Community Resources [employer]: Other (Comment)([facility] in [place]) Premorbid Home Care/DME Agencies: None  Transportation available at discharge: yes Resource referrals recommended: Neuropsychology  Discharge Planning Living Arrangements: Spouse/significant other, Children Support Systems: Spouse/significant other, Children Type of Residence: Private residence Insurance Resources: [employer], Multimedia programmer (specify)(BCBS) Financial Resources: [employer] Screen Referred: No Living Expenses: Medical laboratory scientific officer Management: Spouse, Patient Does the patient have any problems obtaining your medications?: No Home Management: pt and family share responsibilities Patient/Family Preliminary Plans: Pt to return home with wife and  adult daughter. Social Work Anticipated Follow Up Needs: HH/OP Expected length of stay: 10-14 days  Clinical Impression Debilitated gentleman now on CIR and hopeful he will make good progress and his appetite will return.  Able to complete assessment without difficulty.  Has good, 24/7 support of wife and adult daughter at home.  Denies any significant emotional distress.  Will follow for support and d/c planning needs.   Daisey Caloca 09/07/2018, 2:38 PM

## 2018-09-07 NOTE — Care Management (Signed)
Inpatient [facility] Individual Statement of Services  Patient Name:  [name] [name]  Date:  09/07/2018  Welcome to the [facility].  Our goal is to provide you with an individualized program based on your diagnosis and situation, designed to meet your specific needs.  With this comprehensive rehabilitation program, you will be expected to participate in at least 3 hours of rehabilitation therapies Monday-Friday, with modified therapy programming on the weekends.  Your rehabilitation program will include the following services:  Physical Therapy (PT), Occupational Therapy (OT), 24 hour per day rehabilitation nursing, Case Management (Social Worker), Rehabilitation Medicine, Nutrition Services and Pharmacy Services  Weekly team conferences will be held on Tuesdays to discuss your progress.  Your Social Worker will talk with you frequently to get your input and to update you on team discussions.  Team conferences with you and your family in attendance may also be held.  Expected length of stay: 10-14 days   Overall anticipated outcome: supervision to minimal assistance  Depending on your progress and recovery, your program may change. Your Social Worker will coordinate services and will keep you informed of any changes. Your Social Worker's name and contact numbers are listed  below.  The following services may also be recommended but are not provided by the [facility] will be made to provide these services after discharge if needed.  Arrangements include referral to agencies that provide these services.  Your insurance has been verified to be:  Medicare; [place] Your primary doctor is:  [facility]  Pertinent information will be shared with your doctor and your insurance company.  Social Worker:  [place], [place] or [phone]   Information discussed with and copy given to patient by: Lennart Pall, 09/07/2018, 2:40 PM

## 2018-09-07 NOTE — Progress Notes (Signed)
[facility] ROUNDING NOTE   Subjective:   This is a 53 year old gentleman end-stage renal disease secondary to polycystic kidney disease, currently on dialysis Tuesday Thursday Saturday at [facility] clinic followed by Dr. Lowanda Foster.  He has a history of chronic hypotension and continues on Midodrin.  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  He has congestive heart failure with systolic dysfunction with an ejection fraction about 30% per 2D echo 10/25/2017. He presented with a left-sided perinephric hematoma in the setting of a supratherapeutic INR required a total of 7 units packed red blood cells and transcatheter embolization of left kidney 08/29/2018.  Patient underwent successful dialysis 09/06/2018 with removal of 2 L next dialysis planned 09/08/2018  Blood pressure 103/86 pulse 91 temperature 98 O2 sats 98% room air   Sodium 136 potassium 3.4 chloride 100 CO2 25 BUN 50 creatinine 7 glucose 95 calcium 7.8 phosphorus 4.2 albumin 1.5 WBC 9.3 hemoglobin 7.3 platelets 320   Amiodarone 200 mg daily atorvastatin 80 mg daily , Proamatidine 15 mg 3 times daily, Fosrenol 1 g with meals  , Aranesp 100 mcg q. Tuesday, Coumadin per INR, , Megace 400 mg twice daily    Objective:  Vital signs in last 24 hours:  Temp:  [97.5 F (36.4 C)-98 F (36.7 C)] 98 F (36.7 C) (05/21 1940) Pulse Rate:  [86-92] 91 (05/21 1940) Resp:  [18] 18 (05/21 1940) BP: (79-103)/(33-52) 88/51 (05/21 1956) SpO2:  [96 %-98 %] 98 % (05/21 1940) Weight:  [672 kg-129 kg] 127 kg (05/21 1650)  Weight change: 0 kg Filed Weights   09/06/18 1031 09/06/18 1238 09/06/18 1650  Weight: 127 kg 129 kg 127 kg    Intake/Output: I/O last 3 completed shifts: In: 360 [P.O.:320; I.V.:40] Out: 2000 [Other:2000]   Intake/Output this shift:  No intake/output data recorded.  Alert pleasant gentleman obese nondistressed CVS- RRR no elevated  JVP prosthetic heart sound RS- CTA ABD- BS present soft non-distended EXT- no edema left AV fistula with thrill and bruit   Basic Metabolic Panel: Recent Labs  Lab 09/01/18 0737 09/04/18 0715 09/06/18 1307  NA 134* 135 136  K 3.8 3.6 3.4*  CL 93* 97* 100  CO2 25 24 25   GLUCOSE 68* 70 95  BUN 40* 55* 50*  CREATININE 6.22* 7.38* 7.07*  CALCIUM 8.2* 8.0* 7.8*  PHOS 4.4 5.2* 4.2    Liver Function Tests: Recent Labs  Lab 09/01/18 0737 09/04/18 0715 09/06/18 1307  ALBUMIN 1.4* 1.4* 1.5*   No results for input(s): LIPASE, AMYLASE in the last 168 hours. No results for input(s): AMMONIA in the last 168 hours.  CBC: Recent Labs  Lab 09/03/18 0318 09/04/18 0325 09/06/18 0446 09/06/18 1305 09/07/18 0438  WBC 9.7 9.4 10.9* 13.4* 9.3  NEUTROABS  --   --   --  10.8*  --   HGB 7.1* 7.1* 7.2* 7.6* 7.3*  HCT 23.6* 23.7* 24.3* 25.8* 24.4*  MCV 87.7 87.1 88.7 87.5 86.5  PLT 337 330 310 370 320    Cardiac Enzymes: No results for input(s): CKTOTAL, CKMB, CKMBINDEX, TROPONINI in the last 168 hours.  BNP: Invalid input(s): POCBNP  CBG: No results for input(s): GLUCAP in the last 168 hours.  Microbiology: Results for orders placed or performed during the hospital encounter of 08/19/18  SARS Coronavirus 2 (CEPHEID - Performed in [place] hospital lab), [facility] Order     Status: None   Collection  Time: 08/19/18  5:11 PM  Result Value Ref Range Status   SARS Coronavirus 2 NEGATIVE NEGATIVE Final    Comment: (NOTE) If result is NEGATIVE SARS-CoV-2 target nucleic acids are NOT DETECTED. The SARS-CoV-2 RNA is generally detectable in upper and lower  respiratory specimens during the acute phase of infection. The lowest  concentration of SARS-CoV-2 viral copies this assay can detect is 250  copies / mL. A negative result does not preclude SARS-CoV-2 infection  and should not be used as the sole basis for treatment or other  patient management decisions.  A negative result may  occur with  improper specimen collection / handling, submission of specimen other  than nasopharyngeal swab, presence of viral mutation(s) within the  areas targeted by this assay, and inadequate number of viral copies  (<250 copies / mL). A negative result must be combined with clinical  observations, patient history, and epidemiological information. If result is POSITIVE SARS-CoV-2 target nucleic acids are DETECTED. The SARS-CoV-2 RNA is generally detectable in upper and lower  respiratory specimens dur ing the acute phase of infection.  Positive  results are indicative of active infection with SARS-CoV-2.  Clinical  correlation with patient history and other diagnostic information is  necessary to determine patient infection status.  Positive results do  not rule out bacterial infection or co-infection with other viruses. If result is PRESUMPTIVE POSTIVE SARS-CoV-2 nucleic acids MAY BE PRESENT.   A presumptive positive result was obtained on the submitted specimen  and confirmed on repeat testing.  While 2019 novel coronavirus  (SARS-CoV-2) nucleic acids may be present in the submitted sample  additional confirmatory testing may be necessary for epidemiological  and / or clinical management purposes  to differentiate between  SARS-CoV-2 and other Sarbecovirus currently known to infect humans.  If clinically indicated additional testing with an alternate test  methodology [phone]) is advised. The SARS-CoV-2 RNA is generally  detectable in upper and lower respiratory sp ecimens during the acute  phase of infection. The expected result is Negative. Fact Sheet for Patients:  StrictlyIdeas.no Fact Sheet for Healthcare Providers: BankingDealers.co.za This test is not yet approved or cleared by the [place] FDA and has been authorized for detection and/or diagnosis of SARS-CoV-2 by FDA under an Emergency Use Authorization (EUA).  This  EUA will remain in effect (meaning this test can be used) for the duration of the COVID-19 declaration under Section 564(b)(1) of the Act, 21 U.S.C. section 360bbb-3(b)(1), unless the authorization is terminated or revoked sooner. Performed at [facility], [place] [address], [place], [place] [address]     Coagulation Studies: Recent Labs    09/05/18 6659 09/06/18 0446 09/07/18 0438  LABPROT 31.2* 32.7* 32.2*  INR 3.1* 3.3* 3.2*    Urinalysis: No results for input(s): COLORURINE, LABSPEC, PHURINE, GLUCOSEU, HGBUR, BILIRUBINUR, KETONESUR, PROTEINUR, UROBILINOGEN, NITRITE, LEUKOCYTESUR in the last 72 hours.  Invalid input(s): APPERANCEUR    Imaging: No results found.   Medications:    . acetaminophen  650 mg Oral Q6H  . amiodarone  200 mg Oral Daily  . atorvastatin  80 mg Oral q1800  . Chlorhexidine Gluconate Cloth  6 each Topical Q0600  . [START ON 09/11/2018] darbepoetin (ARANESP) injection - DIALYSIS  100 mcg Intravenous Q Tue-HD  . feeding supplement (NEPRO CARB STEADY)  237 mL Oral BID BM  . feeding supplement (PRO-STAT SUGAR FREE 64)  30 mL Oral TID  . lanthanum  1,000 mg Oral TID PC  . megestrol  400 mg Oral BID  . midodrine  15 mg Oral TID WC  . multivitamin  1 tablet Oral QHS  . Warfarin - Pharmacist Dosing Inpatient   Does not apply q1800   acetaminophen, alum & mag hydroxide-simeth, bisacodyl, diphenhydrAMINE, guaiFENesin-dextromethorphan, lanthanum, oxyCODONE, polyethylene glycol, prochlorperazine **OR** prochlorperazine **OR** prochlorperazine, sodium chloride flush, traMADol, traZODone  Assessment/ Plan:   ESRD-Tuesday Thursday dialysis currently dialyzes at [facility] unit.  He was admitted to [facility] for [facility] after prolonged hospitalization.  He will receive dialysis 09/08/2018  ANEMIA-patient has received IV iron and darbepoetin.  Acute blood loss anemia secondary to perinephric hematoma.  Status post embolization.  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  Continues on binders that are non-calcium based  HTN/VOL-congestive heart failure with ejection fraction 30% midodrine 15 mg 3 times daily for blood pressure support.  About 1 L of fluid removed 09/01/2018  Coronary artery disease status status post CABG 2019 history of systolic heart failure.  Hyperlipidemia on statins  AVR with admission INR supratherapeutic retroperitoneal bleed  Recurrent left perinephric hematoma with supratherapeutic INR status post ablation  Atrial fibrillation/AVR continues on amiodarone and anticoagulation.  INR being followed closely   LOS: [place] @TODAY @9 :33 AM

## 2018-09-07 NOTE — Progress Notes (Signed)
Occupational Therapy Session Note  Patient Details  Name: [name] [name] MRN: [id number] Date of Birth: 12/14/1965  Today's Date: 09/07/2018 OT Individual Time: 1100-1155 OT Individual Time Calculation (min): 55 min    Short Term Goals: Week 1:  OT Short Term Goal 1 (Week 1): Pt will tolerate sitting upright out of bed for 3 hrs a day. OT Short Term Goal 2 (Week 1): Pt will perform sit to stand with mod A +1 for LB dressing  OT Short Term Goal 3 (Week 1): Pt will perform lateral transfers (scoot or squat) with min A  OT Short Term Goal 4 (Week 1): Pt will LB dressing with mod A (sit to stand).  Skilled Therapeutic Interventions/Progress Updates:    Pt resting in bed upon arrival.  Pt stated that he is in too much pain to attempt sitting EOB or getting OOB. OT intervention with focus on bed mobility, BUE therex with 5# bar and 2kg ball, education, and emotional support.  Pt concerned that "no one can provide answers."  Pt performed 3X10 of chest presses, overhead presses, and diagonals. Pt stated exercises were painful in abdomen but not like sitting EOB or in w/c. Pt frustrated with current condition.  Pt not eating, stating he didn't have an appetite. Pt remained in bed with all needs within reach and bed alarm activated.   Therapy Documentation Precautions:  Precautions Precautions: Fall Precaution Comments: pain along flanks and abdomen; ice as needed Restrictions Weight Bearing Restrictions: No Pain: Pt c/o 10/10 abdominal pain and tenderness; RN notified   Therapy/Group: Individual Therapy  [name] 09/07/2018, 1:18 PM

## 2018-09-08 LAB — CBC
HCT: 24.9 % — ABNORMAL LOW (ref 39.0–52.0)
Hemoglobin: 7.4 g/dL — ABNORMAL LOW (ref 13.0–17.0)
MCH: 26 pg (ref 26.0–34.0)
MCHC: 29.7 g/dL — ABNORMAL LOW (ref 30.0–36.0)
MCV: 87.4 fL (ref 80.0–100.0)
Platelets: 316 K/uL (ref 150–400)
RBC: 2.85 MIL/uL — ABNORMAL LOW (ref 4.22–5.81)
RDW: 18.2 % — ABNORMAL HIGH (ref 11.5–15.5)
WBC: 9.8 K/uL (ref 4.0–10.5)
nRBC: 0 % (ref 0.0–0.2)

## 2018-09-08 LAB — RENAL FUNCTION PANEL
Albumin: 1.4 g/dL — ABNORMAL LOW (ref 3.5–5.0)
Anion gap: 14 (ref 5–15)
BUN: 49 mg/dL — ABNORMAL HIGH (ref 6–20)
CO2: 25 mmol/L (ref 22–32)
Calcium: 8.5 mg/dL — ABNORMAL LOW (ref 8.9–10.3)
Chloride: 95 mmol/L — ABNORMAL LOW (ref 98–111)
Creatinine, Ser: 6.67 mg/dL — ABNORMAL HIGH (ref 0.61–1.24)
GFR calc Af Amer: 10 mL/min — ABNORMAL LOW
GFR calc non Af Amer: 9 mL/min — ABNORMAL LOW
Glucose, Bld: 94 mg/dL (ref 70–99)
Phosphorus: 4 mg/dL (ref 2.5–4.6)
Potassium: 3.5 mmol/L (ref 3.5–5.1)
Sodium: 134 mmol/L — ABNORMAL LOW (ref 135–145)

## 2018-09-08 LAB — OCCULT BLOOD X 1 CARD TO LAB, STOOL: Fecal Occult Bld: POSITIVE — AB

## 2018-09-08 LAB — PROTIME-INR
INR: 3 — ABNORMAL HIGH (ref 0.8–1.2)
Prothrombin Time: 30.8 s — ABNORMAL HIGH (ref 11.4–15.2)

## 2018-09-08 MED ORDER — SODIUM CHLORIDE 0.9 % IV SOLN: 100.0000 mL | INTRAVENOUS | Status: DC | PRN

## 2018-09-08 MED ORDER — TRAMADOL HCL 50 MG PO TABS
ORAL_TABLET | ORAL | Status: AC
  Filled 2018-09-08: qty 1

## 2018-09-08 MED ORDER — LIDOCAINE HCL (PF) 1 % IJ SOLN: 5.0000 mL | INTRAMUSCULAR | Status: DC | PRN

## 2018-09-08 MED ORDER — LIDOCAINE-PRILOCAINE 2.5-2.5 % EX CREA: 1.0000 "application " | TOPICAL_CREAM | CUTANEOUS | Status: DC | PRN

## 2018-09-08 MED ORDER — PENTAFLUOROPROP-TETRAFLUOROETH EX AERO: 1.0000 "application " | INHALATION_SPRAY | CUTANEOUS | Status: DC | PRN

## 2018-09-08 MED ORDER — HEPARIN SODIUM (PORCINE) 1000 UNIT/ML DIALYSIS: 1000.0000 [IU] | INTRAMUSCULAR | Status: DC | PRN

## 2018-09-08 NOTE — Progress Notes (Addendum)
[place] PHYSICAL MEDICINE & REHABILITATION PROGRESS NOTE   Subjective/Complaints: Concerned about bumps on belly that are tender.  Patient states he is not getting any type of injections, he notices no drainage.  No discoloration, the patient has noticed this over the several weeks but states he has not discussed it with his physicians  ROS: Patient denies fever, rash, sore throat, blurred vision, nausea, vomiting, diarrhea, cough, shortness of breath or chest pain,  headache, or mood change.    Objective:   No results found. Recent Labs    09/07/18 0438 09/08/18 0401  WBC 9.3 9.8  HGB 7.3* 7.4*  HCT 24.4* 24.9*  PLT 320 316   Recent Labs    09/06/18 1307  NA 136  K 3.4*  CL 100  CO2 25  GLUCOSE 95  BUN 50*  CREATININE 7.07*  CALCIUM 7.8*    Intake/Output Summary (Last 24 hours) at 09/08/2018 1109 Last data filed at 09/07/2018 1831 Gross per 24 hour  Intake 240 ml  Output -  Net 240 ml     Physical Exam: Vital Signs Blood pressure 100/67, pulse 92, temperature 98.5 F (36.9 C), temperature source Oral, resp. rate 18, weight 127.1 kg, SpO2 96 %. Constitutional: No distress . Vital signs reviewed. HEENT: EOMI, oral membranes moist Neck: supple Cardiovascular: RRR without murmur. No JVD    Respiratory: CTA Bilaterally without wheezes or rales. Normal effort    GI: BS +, epigastric area tender to palp  Musculoskeletal:  General: No edema.  Comments: Trace LE edema bilaterally remains Neurological: He is alertand oriented to person, place, and time. Nocranial nerve deficit.Coordinationnormal.  UE 5/5. LE: 3/5 HF, 3+ KE and 4/5 ADF/PF bilaterally. No focal sensory findings--no changes Skin: Skin iswarmand dry.  Multiple 1 to 2 cm subcutaneous soft nodules with consistency of lipoma, no skin discoloration no drainage Psychiatric: pleasant    Assessment/Plan: 1. Functional deficits secondary to debility which require 3+ hours per day of  interdisciplinary therapy in a comprehensive inpatient rehab setting.  Physiatrist is providing close team supervision and 24 hour management of active medical problems listed below.  Physiatrist and rehab team continue to assess barriers to discharge/monitor patient progress toward functional and medical goals  Care Tool:  Bathing    Body parts bathed by patient: Right arm, Left arm, Chest, Abdomen, Front perineal area, Face   Body parts bathed by helper: Buttocks, Right upper leg, Left upper leg, Right lower leg, Left lower leg     Bathing assist Assist Level: Moderate Assistance - Patient 50 - 74%     Upper Body Dressing/Undressing Upper body dressing   What is the patient wearing?: Hospital gown only    Upper body assist Assist Level: Minimal Assistance - Patient > 75%    Lower Body Dressing/Undressing Lower body dressing      What is the patient wearing?: Pants, Incontinence brief     Lower body assist Assist for lower body dressing: Total Assistance - Patient < 25%     Toileting Toileting Toileting Activity did not occur Landscape architect and hygiene only): N/A (no void or bm)(HD pt)  Toileting assist Assist for toileting: Dependent - Patient 0%     Transfers Chair/bed transfer  Transfers assist  Chair/bed transfer activity did not occur: Refused  Chair/bed transfer assist level: Maximal Assistance - Patient 25 - 49%(with and without SB)     Locomotion Ambulation   Ambulation assist   Ambulation activity did not occur: Safety/medical concerns  Walk 10 feet activity   Assist  Walk 10 feet activity did not occur: Safety/medical concerns        Walk 50 feet activity   Assist Walk 50 feet with 2 turns activity did not occur: Safety/medical concerns         Walk 150 feet activity   Assist Walk 150 feet activity did not occur: Safety/medical concerns         Walk 10 feet on uneven surface  activity   Assist Walk 10  feet on uneven surfaces activity did not occur: Safety/medical concerns         Wheelchair     Assist     Wheelchair activity did not occur: Refused         Wheelchair 50 feet with 2 turns activity    Assist    Wheelchair 50 feet with 2 turns activity did not occur: Refused       Wheelchair 150 feet activity     Assist Wheelchair 150 feet activity did not occur: Refused        Medical Problem List and Plan: 1.Functional and mobility deficitssecondary to debility after multiple medical issues  -recent hx of bleeding peri-nephric cyst complicated elevated INR --Continue CIR therapies including PT, OT  2. Antithrombotics: -DVT/anticoagulation:Pharmaceutical:Coumadin INR 3.2, adjust per pharm protocol -antiplatelet therapy: N/A 3. Pain Management:PRN oxycodone--may Need to premedicate prior to therapy.  Ice prn to abdomen/flank.  4. Mood:LCSW to follow for evaluation and support. -antipsychotic agents: N/A 5. Neuropsych: This patientiscapable of making decisions on his own behalf. 6. Skin/Wound Care:Routine pressure relief measures. 7. Fluids/Electrolytes/Nutrition:Strict I/O with daily weights. Continue to offer nephro supplements and pro-stat.  -appreciate RD input  -continue megace trial  -I have previously reviewed with the patient that he may need supplemental/enteral nutrition if intake doesn't pick up 8. Anemia of chronic disease: Received Feraheme 5/18 and on Aranesp weekly. . 9. CAD s/p AVR/ICM s/p XTA:VWPVXYI HR BID--continue amiodarone daily. On coumadin-- 10. Polycystic kidney disease/ESRD:HD scheduled at end of the day to allow full participation in therapies 11. Chronic hypotension: On midodrine tid--SBP runs in 90's. 12. Gross blood in stool.   -INR supratherapeutic  -HGB stable from yesterday  -continue to monitor closely     LOS: 4 days A FACE TO [place] E Kirsteins 09/08/2018, 11:09 AM

## 2018-09-08 NOTE — Progress Notes (Signed)
[facility] ROUNDING NOTE   Subjective:   This is a 53 year old gentleman end-stage renal disease secondary to polycystic kidney disease, currently on dialysis Tuesday Thursday Saturday at [facility] clinic followed by Dr. Lowanda Foster.  He has a history of chronic hypotension and continues on Midodrin.  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  He has congestive heart failure with systolic dysfunction with an ejection fraction about 30% per 2D echo 10/25/2017. He presented with a left-sided perinephric hematoma in the setting of a supratherapeutic INR required a total of 7 units packed red blood cells and transcatheter embolization of left kidney 08/29/2018.  Grossly bloody bowel movement noted 09/07/2018.  Patient underwent successful dialysis 09/06/2018 with removal of 2 L next dialysis planned 09/08/2018  Blood pressure 100/67 pulse 92 temperature 98.5 O2 sats 96% room air   Hemoglobin 7.4 stable   INR 3.0   Amiodarone 200 mg daily atorvastatin 80 mg daily , Proamatidine 15 mg 3 times daily, Fosrenol 1 g with meals  , Aranesp 100 mcg q. Tuesday, Coumadin per INR, , Megace 400 mg twice daily    Objective:  Vital signs in last 24 hours:  Temp:  [98.5 F (36.9 C)-98.9 F (37.2 C)] 98.5 F (36.9 C) (05/23 0448) Pulse Rate:  [92-98] 92 (05/23 0553) Resp:  [17-18] 18 (05/23 0448) BP: (78-100)/(36-67) 100/67 (05/23 0553) SpO2:  [94 %-96 %] 96 % (05/23 0448) Weight:  [127.1 kg] 127.1 kg (05/23 0500)  Weight change: 0.1 kg Filed Weights   09/06/18 1238 09/06/18 1650 09/08/18 0500  Weight: 129 kg 127 kg 127.1 kg    Intake/Output: I/O last 3 completed shifts: In: 67 [P.O.:240; I.V.:40] Out: -    Intake/Output this shift:  No intake/output data recorded.  Alert pleasant gentleman obese nondistressed CVS- RRR no elevated JVP prosthetic heart sound RS- CTA ABD- BS present soft  non-distended EXT- no edema left AV fistula with thrill and bruit   Basic Metabolic Panel: Recent Labs  Lab 09/04/18 0715 09/06/18 1307  NA 135 136  K 3.6 3.4*  CL 97* 100  CO2 24 25  GLUCOSE 70 95  BUN 55* 50*  CREATININE 7.38* 7.07*  CALCIUM 8.0* 7.8*  PHOS 5.2* 4.2    Liver Function Tests: Recent Labs  Lab 09/04/18 0715 09/06/18 1307  ALBUMIN 1.4* 1.5*   No results for input(s): LIPASE, AMYLASE in the last 168 hours. No results for input(s): AMMONIA in the last 168 hours.  CBC: Recent Labs  Lab 09/04/18 0325 09/06/18 0446 09/06/18 1305 09/07/18 0438 09/08/18 0401  WBC 9.4 10.9* 13.4* 9.3 9.8  NEUTROABS  --   --  10.8*  --   --   HGB 7.1* 7.2* 7.6* 7.3* 7.4*  HCT 23.7* 24.3* 25.8* 24.4* 24.9*  MCV 87.1 88.7 87.5 86.5 87.4  PLT 330 310 370 320 316    Cardiac Enzymes: No results for input(s): CKTOTAL, CKMB, CKMBINDEX, TROPONINI in the last 168 hours.  BNP: Invalid input(s): POCBNP  CBG: No results for input(s): GLUCAP in the last 168 hours.  Microbiology: Results for orders placed or performed during the hospital encounter of 08/19/18  SARS Coronavirus 2 (CEPHEID - Performed in [facility] hospital lab), [facility] Order     Status: None   Collection Time: 08/19/18  5:11 PM  Result Value Ref Range Status   SARS Coronavirus 2 NEGATIVE NEGATIVE Final    Comment: (NOTE) If result is  NEGATIVE SARS-CoV-2 target nucleic acids are NOT DETECTED. The SARS-CoV-2 RNA is generally detectable in upper and lower  respiratory specimens during the acute phase of infection. The lowest  concentration of SARS-CoV-2 viral copies this assay can detect is 250  copies / mL. A negative result does not preclude SARS-CoV-2 infection  and should not be used as the sole basis for treatment or other  patient management decisions.  A negative result may occur with  improper specimen collection / handling, submission of specimen other  than nasopharyngeal swab, presence of viral  mutation(s) within the  areas targeted by this assay, and inadequate number of viral copies  (<250 copies / mL). A negative result must be combined with clinical  observations, patient history, and epidemiological information. If result is POSITIVE SARS-CoV-2 target nucleic acids are DETECTED. The SARS-CoV-2 RNA is generally detectable in upper and lower  respiratory specimens dur ing the acute phase of infection.  Positive  results are indicative of active infection with SARS-CoV-2.  Clinical  correlation with patient history and other diagnostic information is  necessary to determine patient infection status.  Positive results do  not rule out bacterial infection or co-infection with other viruses. If result is PRESUMPTIVE POSTIVE SARS-CoV-2 nucleic acids MAY BE PRESENT.   A presumptive positive result was obtained on the submitted specimen  and confirmed on repeat testing.  While 2019 novel coronavirus  (SARS-CoV-2) nucleic acids may be present in the submitted sample  additional confirmatory testing may be necessary for epidemiological  and / or clinical management purposes  to differentiate between  SARS-CoV-2 and other Sarbecovirus currently known to infect humans.  If clinically indicated additional testing with an alternate test  methodology [phone]) is advised. The SARS-CoV-2 RNA is generally  detectable in upper and lower respiratory sp ecimens during the acute  phase of infection. The expected result is Negative. Fact Sheet for Patients:  StrictlyIdeas.no Fact Sheet for Healthcare Providers: BankingDealers.co.za This test is not yet approved or cleared by the [place] FDA and has been authorized for detection and/or diagnosis of SARS-CoV-2 by FDA under an Emergency Use Authorization (EUA).  This EUA will remain in effect (meaning this test can be used) for the duration of the COVID-19 declaration under Section 564(b)(1)  of the Act, 21 U.S.C. section 360bbb-3(b)(1), unless the authorization is terminated or revoked sooner. Performed at [facility], [place] [address] [place], [place] [address]     Coagulation Studies: Recent Labs    09/06/18 0446 09/07/18 0438 09/08/18 0401  LABPROT 32.7* 32.2* 30.8*  INR 3.3* 3.2* 3.0*    Urinalysis: No results for input(s): COLORURINE, LABSPEC, PHURINE, GLUCOSEU, HGBUR, BILIRUBINUR, KETONESUR, PROTEINUR, UROBILINOGEN, NITRITE, LEUKOCYTESUR in the last 72 hours.  Invalid input(s): APPERANCEUR    Imaging: No results found.   Medications:    . acetaminophen  650 mg Oral Q6H  . amiodarone  200 mg Oral Daily  . atorvastatin  80 mg Oral q1800  . Chlorhexidine Gluconate Cloth  6 each Topical Q0600  . [START ON 09/11/2018] darbepoetin (ARANESP) injection - DIALYSIS  100 mcg Intravenous Q Tue-HD  . feeding supplement (NEPRO CARB STEADY)  237 mL Oral BID BM  . feeding supplement (PRO-STAT SUGAR FREE 64)  30 mL Oral TID  . lanthanum  1,000 mg Oral TID PC  . megestrol  400 mg Oral BID  . midodrine  15 mg Oral TID WC  . multivitamin  1 tablet Oral QHS  . Warfarin - Pharmacist Dosing  Inpatient   Does not apply q1800   acetaminophen, alum & mag hydroxide-simeth, bisacodyl, diphenhydrAMINE, guaiFENesin-dextromethorphan, lanthanum, oxyCODONE, polyethylene glycol, prochlorperazine **OR** prochlorperazine **OR** prochlorperazine, sodium chloride flush, traMADol, traZODone  Assessment/ Plan:   ESRD-Tuesday Thursday dialysis currently dialyzes at [facility] unit.  He was admitted to [facility] for rehabilitation after prolonged hospitalization.  He will receive dialysis 09/08/2018  ANEMIA-patient has received IV iron and darbepoetin.  Acute blood loss anemia secondary to perinephric hematoma.  Status post embolization.  Hemoglobin has been stable.  Gross bloody bowel movement noted  MBD-continue to follow calcium and phosphorus level.  Continues on binders that are  non-calcium based  HTN/VOL-congestive heart failure with ejection fraction 30% midodrine 15 mg 3 times daily for blood pressure support.  About 1 L of fluid removed 09/01/2018  Coronary artery disease status status post CABG 2019 history of systolic heart failure.  Hyperlipidemia on statins  AVR with admission INR supratherapeutic retroperitoneal bleed  Recurrent left perinephric hematoma with supratherapeutic INR status post ablation  Atrial fibrillation/AVR continues on amiodarone and anticoagulation.  INR being followed closely   LOS: [place] @TODAY @8 :23 AM

## 2018-09-08 NOTE — Progress Notes (Addendum)
[place] for warfarin Indication: Mechanical valve  No Known Allergies  Patient Measurements: Weight: 280 lb 3.3 oz (127.1 kg)  Vital Signs: Temp: 98.5 F (36.9 C) (05/23 0448) Temp Source: Oral (05/23 0448) BP: 100/67 (05/23 0553) Pulse Rate: 92 (05/23 0553)  Labs: Recent Labs    09/06/18 0446 09/06/18 1305 09/06/18 1307 09/07/18 0438 09/08/18 0401  HGB 7.2* 7.6*  --  7.3* 7.4*  HCT 24.3* 25.8*  --  24.4* 24.9*  PLT 310 370  --  320 316  LABPROT 32.7*  --   --  32.2* 30.8*  INR 3.3*  --   --  3.2* 3.0*  CREATININE  --   --  7.07*  --   --     Estimated Creatinine Clearance: 16.9 mL/min (A) (by C-G formula based on SCr of 7.07 mg/dL (H)).  Assessment: 54 YOM on warfarin PTA for mech aortic valve. Recent admission 4/15-4/21 forsupratherapeutic INR andruptured hemorrhagic renal cyst. Readmitted 5/3 with abdominal pain, gross hematuria, and supratherapeutic INR. CT abdomen at OSH showed increase in size of perinephric hematoma. Patient s/p embolization on 5/13. Last dose warfarin PTA was on 5/2 and patient is s/p 8 mg total of Vitamin K during hospitalization. INR goal has been decreased to 2-2.5 per MD. Warfarin resumed 5/14.    INR today remains SUPRAtherapeutic at 3  Hgb 7.4 (low/stable), Pltc WNL  Gross blood in stool 5/21 per notes  FOBT + on 5/22  Per RN, still with penile bleeding, but no worse than previously  Goal of Therapy:  INR 2-2.5 Monitor platelets by anticoagulation protocol: Yes   Plan:  - Continue to hold warfarin today - Daily PT/INR - Follow CBC and for s/sx of bleeding    Lindell Spar, PharmD, BCPS Clinical Pharmacist Please utilize Amion for appropriate phone number to reach the unit pharmacist ([place]) 09/08/2018 11:23 AM

## 2018-09-09 ENCOUNTER — Inpatient Hospital Stay (HOSPITAL_COMMUNITY): Payer: Medicare Other | Admitting: Occupational Therapy

## 2018-09-09 ENCOUNTER — Inpatient Hospital Stay (HOSPITAL_COMMUNITY): Payer: Medicare Other | Admitting: Physical Therapy

## 2018-09-09 ENCOUNTER — Inpatient Hospital Stay (HOSPITAL_COMMUNITY): Payer: Medicare Other | Admitting: *Deleted

## 2018-09-09 LAB — CBC
HCT: 26.6 % — ABNORMAL LOW (ref 39.0–52.0)
Hemoglobin: 7.8 g/dL — ABNORMAL LOW (ref 13.0–17.0)
MCH: 25.7 pg — ABNORMAL LOW (ref 26.0–34.0)
MCHC: 29.3 g/dL — ABNORMAL LOW (ref 30.0–36.0)
MCV: 87.5 fL (ref 80.0–100.0)
Platelets: 348 10*3/uL (ref 150–400)
RBC: 3.04 MIL/uL — ABNORMAL LOW (ref 4.22–5.81)
RDW: 18.2 % — ABNORMAL HIGH (ref 11.5–15.5)
WBC: 10.3 10*3/uL (ref 4.0–10.5)
nRBC: 0 % (ref 0.0–0.2)

## 2018-09-09 LAB — PROTIME-INR
INR: 2.5 — ABNORMAL HIGH (ref 0.8–1.2)
Prothrombin Time: 26.4 seconds — ABNORMAL HIGH (ref 11.4–15.2)

## 2018-09-09 MED ORDER — CINACALCET HCL 30 MG PO TABS
30.0000 mg | ORAL_TABLET | Freq: Every day | ORAL | Status: DC
  Administered 2018-09-09 – 2018-09-17 (×8): 30 mg via ORAL
  Filled 2018-09-09 (×8): qty 1

## 2018-09-09 MED ORDER — SODIUM THIOSULFATE 25 % IV SOLN
25.0000 g | INTRAVENOUS | Status: DC
  Administered 2018-09-11: 25 g via INTRAVENOUS
  Filled 2018-09-09 (×4): qty 100

## 2018-09-09 NOTE — Progress Notes (Signed)
Physical Therapy Session Note  Patient Details  Name: [name] [name] MRN: [id number] Date of Birth: 05-28-1965  Today's Date: 09/09/2018 PT Individual Time: 1000-1103 PT Individual Time Calculation (min): 63 min   Short Term Goals: Week 1:  PT Short Term Goal 1 (Week 1): Pt will initiate OOB mobility PT Short Term Goal 2 (Week 1): Pt will initiate gait training PT Short Term Goal 3 (Week 1): Pt will perform sit<>stand w/ max assist x1 PT Short Term Goal 4 (Week 1): Pt will tolerate 30 min of upright activity w/o increase in fatigue  Skilled Therapeutic Interventions/Progress Updates:    Pt received supine in bed and agreeable to therapy session; however, states abdominal pain with "noddles" across abdomen - RN and nephrology MD aware and assessed pt this AM. Pt wearing B LE TED hose throughout session. Performed supine B LE exercises 2 x 10 repetitions of the following with cuing throughout for proper technique: ankle DF/PF, heel slides, SAQ, hip abduction/adduction. Extensive pt education on importance of sidelying for sacral pressure relief - attempted supine>L sidelying with pt unable to tolerate due to abdominal pain - supine>R sidelying with supervision (all 4 bedrails up for pt safety) and cuing/manual facilitation to educate pt on full sidelying - pt able to maintain position for at least 1 minute. Vitals assessed in supine: BP 93/70 (MAP 78) HR 97bpm. Supine>sit with HOB partially elevated and using bedrails with close supervision and increased time. Sitting vitals: 99/74 (MAP 82) HR 108bpm then after ~34minutes reports onset of dizziness described as "the room spinning" with no nystagmus noted. Tolerated sitting 4 minutes prior to returning to supine with close supervision and increased time for B LE management. Pt agreeable to sitting EOB 2nd time; supine>sit as described above and tolerated sitting for 10 minutes with pt continuing to report dizziness as described above. Performed seated BLE  LAQ 2x10 repetitions with cuing for proper technique. Pt educated on importance of increasing upright activity tolerance and progression of OOB mobility. Returned to supine as described above and pt left supine in bed, needs in reach, and bed alarm on.  Therapy Documentation Precautions:  Precautions Precautions: Fall Precaution Comments: pain along flanks and abdomen; ice as needed Restrictions Weight Bearing Restrictions: No  Pain: Pt reports 10/10 pain upon arrival and throughout therapy session - RN aware and therapist provided rest breaks and repositioning throughout for pain management.    Therapy/Group: Individual Therapy  Tawana Scale, PT, DPT 09/09/2018, 7:56 AM

## 2018-09-09 NOTE — Progress Notes (Signed)
[facility] ROUNDING NOTE   Subjective:   This is a 53 year old gentleman end-stage renal disease secondary to polycystic kidney disease, currently on dialysis Tuesday Thursday Saturday at [facility] clinic followed by Dr. Lowanda Foster.  He has a history of chronic hypotension and continues on Midodrin.  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  He has congestive heart failure with systolic dysfunction with an ejection fraction about 30% per 2D echo 10/25/2017. He presented with a left-sided perinephric hematoma in the setting of a supratherapeutic INR required a total of 7 units packed red blood cells and transcatheter embolization of left kidney 08/29/2018.  Tender subcutaneous nodules with firmness left lower quadrant abdomen.  Grossly bloody bowel movement noted 09/07/2018.  Patient underwent successful dialysis 09/08/2018 with removal of 3 L  Blood pressure 120/83 pulse 99 temperature 98 O2 sats 97% room air  Sodium 134 potassium 3.5 chloride 95 CO2 25 BUN 49 creatinine 6.67 CO2 25 calcium 8.5 phosphorus 4.0 albumin 1.4 hemoglobin 7.8  Amiodarone 200 mg daily atorvastatin 80 mg daily , Proamatidine 15 mg 3 times daily, Fosrenol 1 g with meals  , Aranesp 100 mcg q. Tuesday, Coumadin per INR, , Megace 400 mg twice daily    Objective:  Vital signs in last 24 hours:  Temp:  [97.4 F (36.3 C)-98.4 F (36.9 C)] 98 F (36.7 C) (05/24 0505) Pulse Rate:  [86-100] 99 (05/24 0505) Resp:  [15-19] 19 (05/24 0505) BP: (92-122)/(44-98) 120/83 (05/24 0505) SpO2:  [94 %-97 %] 97 % (05/24 0505) Weight:  [123.9 kg-127.4 kg] 124.2 kg (05/24 0505)  Weight change: 0.3 kg Filed Weights   09/08/18 1300 09/08/18 1709 09/09/18 0505  Weight: 127.4 kg 123.9 kg 124.2 kg    Intake/Output: I/O last 3 completed shifts: In: 258 [P.O.:238; I.V.:20] Out: 3000 [Other:3000]   Intake/Output this shift:  Total I/O In: 40  [P.O.:40] Out: -   Alert pleasant gentleman obese nondistressed CVS- RRR no elevated JVP prosthetic heart sound RS- CTA ABD-firm and tender nodules located over abdomen.  Worse left lower quadrant EXT- no edema left AV fistula with thrill and bruit   Basic Metabolic Panel: Recent Labs  Lab 09/04/18 0715 09/06/18 1307 09/08/18 1317  NA 135 136 134*  K 3.6 3.4* 3.5  CL 97* 100 95*  CO2 24 25 25   GLUCOSE 70 95 94  BUN 55* 50* 49*  CREATININE 7.38* 7.07* 6.67*  CALCIUM 8.0* 7.8* 8.5*  PHOS 5.2* 4.2 4.0    Liver Function Tests: Recent Labs  Lab 09/04/18 0715 09/06/18 1307 09/08/18 1317  ALBUMIN 1.4* 1.5* 1.4*   No results for input(s): LIPASE, AMYLASE in the last 168 hours. No results for input(s): AMMONIA in the last 168 hours.  CBC: Recent Labs  Lab 09/06/18 0446 09/06/18 1305 09/07/18 0438 09/08/18 0401 09/09/18 0308  WBC 10.9* 13.4* 9.3 9.8 10.3  NEUTROABS  --  10.8*  --   --   --   HGB 7.2* 7.6* 7.3* 7.4* 7.8*  HCT 24.3* 25.8* 24.4* 24.9* 26.6*  MCV 88.7 87.5 86.5 87.4 87.5  PLT 310 370 320 316 348    Cardiac Enzymes: No results for input(s): CKTOTAL, CKMB, CKMBINDEX, TROPONINI in the last 168 hours.  BNP: Invalid input(s): POCBNP  CBG: No results for input(s): GLUCAP in the last 168 hours.  Microbiology: Results for orders placed or performed during the hospital encounter of 08/19/18  SARS Coronavirus 2 (CEPHEID -  Performed in [facility] hospital lab), Hosp Order     Status: None   Collection Time: 08/19/18  5:11 PM  Result Value Ref Range Status   SARS Coronavirus 2 NEGATIVE NEGATIVE Final    Comment: (NOTE) If result is NEGATIVE SARS-CoV-2 target nucleic acids are NOT DETECTED. The SARS-CoV-2 RNA is generally detectable in upper and lower  respiratory specimens during the acute phase of infection. The lowest  concentration of SARS-CoV-2 viral copies this assay can detect is 250  copies / mL. A negative result does not preclude SARS-CoV-2  infection  and should not be used as the sole basis for treatment or other  patient management decisions.  A negative result may occur with  improper specimen collection / handling, submission of specimen other  than nasopharyngeal swab, presence of viral mutation(s) within the  areas targeted by this assay, and inadequate number of viral copies  (<250 copies / mL). A negative result must be combined with clinical  observations, patient history, and epidemiological information. If result is POSITIVE SARS-CoV-2 target nucleic acids are DETECTED. The SARS-CoV-2 RNA is generally detectable in upper and lower  respiratory specimens dur ing the acute phase of infection.  Positive  results are indicative of active infection with SARS-CoV-2.  Clinical  correlation with patient history and other diagnostic information is  necessary to determine patient infection status.  Positive results do  not rule out bacterial infection or co-infection with other viruses. If result is PRESUMPTIVE POSTIVE SARS-CoV-2 nucleic acids MAY BE PRESENT.   A presumptive positive result was obtained on the submitted specimen  and confirmed on repeat testing.  While 2019 novel coronavirus  (SARS-CoV-2) nucleic acids may be present in the submitted sample  additional confirmatory testing may be necessary for epidemiological  and / or clinical management purposes  to differentiate between  SARS-CoV-2 and other Sarbecovirus currently known to infect humans.  If clinically indicated additional testing with an alternate test  methodology [phone]) is advised. The SARS-CoV-2 RNA is generally  detectable in upper and lower respiratory sp ecimens during the acute  phase of infection. The expected result is Negative. Fact Sheet for Patients:  StrictlyIdeas.no Fact Sheet for Healthcare Providers: BankingDealers.co.za This test is not yet approved or cleared by the [place]  FDA and has been authorized for detection and/or diagnosis of SARS-CoV-2 by FDA under an Emergency Use Authorization (EUA).  This EUA will remain in effect (meaning this test can be used) for the duration of the COVID-19 declaration under Section 564(b)(1) of the Act, 21 U.S.C. section 360bbb-3(b)(1), unless the authorization is terminated or revoked sooner. Performed at [facility], [place] [address] [place], [place] [address]     Coagulation Studies: Recent Labs    09/07/18 0438 09/08/18 0401 09/09/18 0308  LABPROT 32.2* 30.8* 26.4*  INR 3.2* 3.0* 2.5*    Urinalysis: No results for input(s): COLORURINE, LABSPEC, PHURINE, GLUCOSEU, HGBUR, BILIRUBINUR, KETONESUR, PROTEINUR, UROBILINOGEN, NITRITE, LEUKOCYTESUR in the last 72 hours.  Invalid input(s): APPERANCEUR    Imaging: No results found.   Medications:    . acetaminophen  650 mg Oral Q6H  . amiodarone  200 mg Oral Daily  . atorvastatin  80 mg Oral q1800  . Chlorhexidine Gluconate Cloth  6 each Topical Q0600  . [START ON 09/11/2018] darbepoetin (ARANESP) injection - DIALYSIS  100 mcg Intravenous Q Tue-HD  . feeding supplement (NEPRO CARB STEADY)  237 mL Oral BID BM  . feeding supplement (PRO-STAT SUGAR FREE 64)  30 mL Oral TID  . lanthanum  1,000 mg Oral TID PC  . megestrol  400 mg Oral BID  . midodrine  15 mg Oral TID WC  . multivitamin  1 tablet Oral QHS  . Warfarin - Pharmacist Dosing Inpatient   Does not apply q1800   acetaminophen, alum & mag hydroxide-simeth, bisacodyl, diphenhydrAMINE, guaiFENesin-dextromethorphan, lanthanum, oxyCODONE, polyethylene glycol, prochlorperazine **OR** prochlorperazine **OR** prochlorperazine, sodium chloride flush, traMADol, traZODone  Assessment/ Plan:   ESRD-Tuesday Thursday dialysis currently dialyzes at [facility] unit.  He was admitted to [facility] for rehabilitation after prolonged hospitalization.  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  Non-calcium based binders.  Corrected calcium about 10.  Will start Sensipar and sodium thiosulfate with dialysis.  Would recommend general surgery consultation for deep tissue biopsy.  ANEMIA-patient has received IV iron and darbepoetin.  Acute blood loss anemia secondary to perinephric hematoma.  Status post embolization.  Hemoglobin has been stable.    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  Continues on binders that are non-calcium based  HTN/VOL-congestive heart failure with ejection fraction 30% midodrine 15 mg 3 times daily for blood pressure support.     Coronary artery disease status status post CABG 2019 history of systolic heart failure.  Hyperlipidemia on statins  AVR with admission INR supratherapeutic retroperitoneal bleed  Recurrent left perinephric hematoma with supratherapeutic INR status post ablation  Atrial fibrillation/AVR continues on amiodarone and anticoagulation.  INR being followed closely   LOS: [place] @TODAY @9 :43 AM

## 2018-09-09 NOTE — Progress Notes (Signed)
[name] [name] for warfarin Indication: Mechanical valve  No Known Allergies  Patient Measurements: Weight: 273 lb 13.3 oz (124.2 kg)  Vital Signs: Temp: 98.3 F (36.8 C) (05/24 1328) Temp Source: Oral (05/24 1328) BP: 94/60 (05/24 1328) Pulse Rate: 92 (05/24 1328)  Labs: Recent Labs    09/07/18 0438 09/08/18 0401 09/08/18 1317 09/09/18 0308  HGB 7.3* 7.4*  --  7.8*  HCT 24.4* 24.9*  --  26.6*  PLT 320 316  --  348  LABPROT 32.2* 30.8*  --  26.4*  INR 3.2* 3.0*  --  2.5*  CREATININE  --   --  6.67*  --     Estimated Creatinine Clearance: 17.7 mL/min (A) (by C-G formula based on SCr of 6.67 mg/dL (H)).  Assessment: 52 YOM on warfarin PTA for mech aortic valve. Recent admission 4/15-4/21 forsupratherapeutic INR andruptured hemorrhagic renal cyst. Readmitted 5/3 with abdominal pain, gross hematuria, and supratherapeutic INR. CT abdomen at OSH showed increase in size of perinephric hematoma. Patient s/p embolization on 5/13. Last dose warfarin PTA was on 5/2 and patient is s/p 8 mg total of Vitamin K during hospitalization. INR goal has been decreased to 2-2.5 per MD. Warfarin resumed 5/14.    INR down to therapeutic range, 2.5, today after holding warfarin x 5 days  Hgb 7.8 (low/stable), Pltc WNL  Gross blood in stool 5/21 per notes  FOBT + on 5/22  Per RN, old dried blood near penis today, no active bleeding  Per MD, concern for calciphylaxis in long-term dialysis patient, general surgery consult requested for biopsy   Goal of Therapy:  INR 2-2.5 Monitor platelets by anticoagulation protocol: Yes   Plan:  - Per discussion with Dr. Letta Pate, continue to hold warfarin today due to possible biopsy  - Daily PT/INR - Follow CBC and for s/sx of bleeding  - Follow up plans on when safe to resume warfarin    Lindell Spar, PharmD, BCPS Clinical Pharmacist Please utilize Amion for appropriate phone number to reach the unit  pharmacist ([place]) 09/09/2018 2:23 AM

## 2018-09-09 NOTE — Progress Notes (Signed)
Physical Therapy Session Note  Patient Details  Name: [name] [name] MRN: [id number] Date of Birth: 25-Jan-1966  Today's Date: 09/09/2018 PT Individual Time: 0805-0900 PT Individual Time Calculation (min): 55 min   Short Term Goals: Week 1:  PT Short Term Goal 1 (Week 1): Pt will initiate OOB mobility PT Short Term Goal 2 (Week 1): Pt will initiate gait training PT Short Term Goal 3 (Week 1): Pt will perform sit<>stand w/ max assist x1 PT Short Term Goal 4 (Week 1): Pt will tolerate 30 min of upright activity w/o increase in fatigue  Skilled Therapeutic Interventions/Progress Updates: Pt presented in bed agreeable to therapy with encouragement. Pt frustrated due to increased pain in L flank. Nephrologist arrived to discuss with pt possible reasons for pain and adv will consult on call MD. After MD left pt expressed frustration at situation and PTA provided emotional support. Pt provided encouragement for pt to perform OOB activity which pt was reluctant to perform. PTA left to obtain new w/c cushion as nsg advised pt with open sore on R buttock. PTA obtained Roho hybrid cushion and replaced on chair. Pt performed rolling to R for PTA to adjust and close brief and PTA noted pt with bowl incontinence. Pt then performed rolling L/R with use of bed rails and supervision while PTA performed pericare. Pt noted to have new open areas on buttock, nsg notified and came to room to inspect. New foam dressings applied. Pt encouraged to at least sit at EOB which pt did with minA and increased time. Pt able to tolerate sitting EOB x 5 min before requesting to return to bed. Pt then performed lateral scoots with modA to [facility] and returned to supine with minA. Pt repositioned for comfort and left with call bell within reach and needs met.       Therapy Documentation Precautions:  Precautions Precautions: Fall Precaution Comments: pain along flanks and abdomen; ice as needed Restrictions Weight Bearing  Restrictions: No General:   Vital Signs: Therapy Vitals Pulse Rate: 97 BP: 93/70 Patient Position (if appropriate): Lying Pain: Pain Assessment Pain Scale: 0-10 Pain Score: 10-Worst pain ever Pain Location: Back Pain Onset: On-going Patients Stated Pain Goal: 7 Pain Intervention(s): Medication (See eMAR);Heat applied    Therapy/Group: Individual Therapy  Cherysh Epperly  Dyshon Philbin, PTA  09/09/2018, 12:49 PM

## 2018-09-09 NOTE — Progress Notes (Signed)
[place] PHYSICAL MEDICINE & REHABILITATION PROGRESS NOTE   Subjective/Complaints: Patient continues to complain of abdominal pain in the area of nodules.  He feels that they have have come up over the last couple weeks.  No drainage no discoloration no erythema no sweats chills. Discussed with nephrology, calciphylaxis has been suspected.  Long-term dialysis patient  ROS: Patient denies fever, rash, sore throat, blurred vision, nausea, vomiting, diarrhea, cough, shortness of breath or chest pain,  headache, or mood change.    Objective:   No results found. Recent Labs    09/08/18 0401 09/09/18 0308  WBC 9.8 10.3  HGB 7.4* 7.8*  HCT 24.9* 26.6*  PLT 316 348   Recent Labs    09/06/18 1307 09/08/18 1317  NA 136 134*  K 3.4* 3.5  CL 100 95*  CO2 25 25  GLUCOSE 95 94  BUN 50* 49*  CREATININE 7.07* 6.67*  CALCIUM 7.8* 8.5*    Intake/Output Summary (Last 24 hours) at 09/09/2018 1007 Last data filed at 09/09/2018 0900 Gross per 24 hour  Intake 298 ml  Output 3000 ml  Net -2702 ml     Physical Exam: Vital Signs Blood pressure 120/83, pulse 99, temperature 98 F (36.7 C), temperature source Oral, resp. rate 19, weight 124.2 kg, SpO2 97 %. Constitutional: No distress . Vital signs reviewed. HEENT: EOMI, oral membranes moist Neck: supple Cardiovascular: RRR without murmur. No JVD    Respiratory: CTA Bilaterally without wheezes or rales. Normal effort    GI: BS +, epigastric area tender to palp  Musculoskeletal:  General: No edema.  Comments: Trace LE edema bilaterally remains Neurological: He is alertand oriented to person, place, and time. Nocranial nerve deficit.Coordinationnormal.  UE 5/5. LE: 3/5 HF, 3+ KE and 4/5 ADF/PF bilaterally. No focal sensory findings--no changes Skin: Skin iswarmand dry.  Multiple 1 to 2 cm subcutaneous soft nodules with consistency of lipoma, no skin discoloration no drainage Psychiatric:  pleasant    Assessment/Plan: 1. Functional deficits secondary to debility which require 3+ hours per day of interdisciplinary therapy in a comprehensive inpatient rehab setting.  Physiatrist is providing close team supervision and 24 hour management of active medical problems listed below.  Physiatrist and rehab team continue to assess barriers to discharge/monitor patient progress toward functional and medical goals  Care Tool:  Bathing    Body parts bathed by patient: Right arm, Left arm, Chest, Abdomen, Front perineal area, Face   Body parts bathed by helper: Buttocks, Right upper leg, Left upper leg, Right lower leg, Left lower leg     Bathing assist Assist Level: Moderate Assistance - Patient 50 - 74%     Upper Body Dressing/Undressing Upper body dressing   What is the patient wearing?: Hospital gown only    Upper body assist Assist Level: Minimal Assistance - Patient > 75%    Lower Body Dressing/Undressing Lower body dressing      What is the patient wearing?: Pants, Incontinence brief     Lower body assist Assist for lower body dressing: Total Assistance - Patient < 25%     Toileting Toileting Toileting Activity did not occur Landscape architect and hygiene only): N/A (no void or bm)(HD pt)  Toileting assist Assist for toileting: Dependent - Patient 0%     Transfers Chair/bed transfer  Transfers assist  Chair/bed transfer activity did not occur: Refused  Chair/bed transfer assist level: Maximal Assistance - Patient 25 - 49%(with and without SB)     Locomotion Ambulation   Ambulation  assist   Ambulation activity did not occur: Safety/medical concerns          Walk 10 feet activity   Assist  Walk 10 feet activity did not occur: Safety/medical concerns        Walk 50 feet activity   Assist Walk 50 feet with 2 turns activity did not occur: Safety/medical concerns         Walk 150 feet activity   Assist Walk 150 feet activity  did not occur: Safety/medical concerns         Walk 10 feet on uneven surface  activity   Assist Walk 10 feet on uneven surfaces activity did not occur: Safety/medical concerns         Wheelchair     Assist     Wheelchair activity did not occur: Refused         Wheelchair 50 feet with 2 turns activity    Assist    Wheelchair 50 feet with 2 turns activity did not occur: Refused       Wheelchair 150 feet activity     Assist Wheelchair 150 feet activity did not occur: Refused        Medical Problem List and Plan: 1.Functional and mobility deficitssecondary to debility after multiple medical issues  -recent hx of bleeding peri-nephric cyst complicated elevated INR --Continue CIR therapies including PT, OT  2. Antithrombotics: -DVT/anticoagulation:Pharmaceutical:Coumadin INR 2.5, adjust per pharm protocol -antiplatelet therapy: N/A 3. Pain Management:PRN oxycodone--may Need to premedicate prior to therapy.  Ice prn to abdomen/flank.  4. Mood:LCSW to follow for evaluation and support. -antipsychotic agents: N/A 5. Neuropsych: This patientiscapable of making decisions on his own behalf. 6. Skin/Wound Care:Routine pressure relief measures. Abdominal nodules suspect calciphylaxis, discussed with nephrology they will add thiosulfate at dialysis, request general surgery consult for biopsy 7. Fluids/Electrolytes/Nutrition:Strict I/O with daily weights. Continue to offer nephro supplements and pro-stat.  -appreciate RD input  -continue megace trial  8. Anemia of chronic disease: Received Feraheme 5/18 and on Aranesp weekly. . 9. CAD s/p AVR/ICM s/p HWY:SHUOHFG HR BID--continue amiodarone daily. On coumadin-- 10. Polycystic kidney disease/ESRD:HD scheduled at end of the day to allow full participation in therapies 11. Chronic hypotension: On midodrine tid--SBP runs in 90's. 12. Gross blood in stool.    -INR therapeutic 2.5 5/25  -HGB trending up  -continue to monitor closely     LOS: 5 days A FACE TO Collins E Daxton Nydam 09/09/2018, 10:07 AM

## 2018-09-09 NOTE — Progress Notes (Signed)
Occupational Therapy Session Note  Patient Details  Name: [name] [name] MRN: [id number] Date of Birth: 1966-01-31  Today's Date: 09/09/2018 OT Individual Time: 1300-1330 and 1400 -1430 OT Individual Time Calculation (min): 30 min and 30 min   Short Term Goals: Week 1:  OT Short Term Goal 1 (Week 1): Pt will tolerate sitting upright out of bed for 3 hrs a day. OT Short Term Goal 2 (Week 1): Pt will perform sit to stand with mod A +1 for LB dressing  OT Short Term Goal 3 (Week 1): Pt will perform lateral transfers (scoot or squat) with min A  OT Short Term Goal 4 (Week 1): Pt will LB dressing with mod A (sit to stand).  Skilled Therapeutic Interventions/Progress Updates:    Pt's session split into 2 short visits to allow him time to rest with a hot pack on his abdomen.  Pt initially resting in bed with hot pack on his back but continued to c/o pain in abdomen stating that he would not be able to tolerate getting up.  Discussed the recent discussions he has had with the doctors about his abdominal pain.  Pt attempted rolling and sitting up but too painful. Obtained a hot pack to place on abdomen to relax muscles.  Pt tolerated weight of pack and heat well.  Had pt rest with heat for 30 min.  Returned to pt's room to work on [employer] transfers from regular bed to w/c and then back to an air mattress bed.  He stated warmth did relax him and he was feeling a bit better.  Pt was able to flex knees and roll to L side with min A and then with cues pushed to sit with min A.  Pt sat to EOB and leaned to have board placed.  Pt used slide board with max A with 2nd person present for safety with positioning.  Multiple cues for hand placement and technique.  The bed was quickly changed out for him and then he used board back to bed with much improved use of his legs to scoot along the board vs sliding with mod A.  Mod A to sidelying and supine.  Pt adjusted in bed with min A and RN with pt.    Therapy  Documentation Precautions:  Precautions Precautions: Fall Precaution Comments: pain along flanks and abdomen; ice as needed Restrictions Weight Bearing Restrictions: No    Vital Signs: Therapy Vitals Temp: 98 F (36.7 C) Temp Source: Oral Pulse Rate: 99 Resp: 19 BP: 120/83 Patient Position (if appropriate): Lying Oxygen Therapy SpO2: 97 % O2 Device: Room Air Pain: Pain Assessment Pain Scale: 0-10 Pain Score: 10-Worst pain ever Pain Type: Acute pain Pain Location: Abdomen Pain Orientation: Left;Lower Pain Descriptors / Indicators: Tender Pain Frequency: Constant Pain Onset: On-going Patients Stated Pain Goal: 5 Pain Intervention(s): Medication (See eMAR);Emotional support Multiple Pain Sites: Yes 2nd Pain Site Pain Score: 10 Pain Type: Acute pain Pain Location: Back Pain Orientation: Lower Pain Descriptors / Indicators: Aching Pain Frequency: Constant Pain Onset: On-going Patient's Stated Pain Goal: 3 Pain Intervention(s): Medication (See eMAR);Emotional support    Therapy/Group: Individual Therapy  [place] 09/09/2018, 8:45 AM

## 2018-09-10 ENCOUNTER — Inpatient Hospital Stay (HOSPITAL_COMMUNITY): Payer: Medicare Other

## 2018-09-10 LAB — PROTIME-INR
INR: 2.3 — ABNORMAL HIGH (ref 0.8–1.2)
Prothrombin Time: 24.8 seconds — ABNORMAL HIGH (ref 11.4–15.2)

## 2018-09-10 LAB — CBC
HCT: 25 % — ABNORMAL LOW (ref 39.0–52.0)
Hemoglobin: 7.4 g/dL — ABNORMAL LOW (ref 13.0–17.0)
MCH: 26 pg (ref 26.0–34.0)
MCHC: 29.6 g/dL — ABNORMAL LOW (ref 30.0–36.0)
MCV: 87.7 fL (ref 80.0–100.0)
Platelets: 332 10*3/uL (ref 150–400)
RBC: 2.85 MIL/uL — ABNORMAL LOW (ref 4.22–5.81)
RDW: 18.3 % — ABNORMAL HIGH (ref 11.5–15.5)
WBC: 9.6 10*3/uL (ref 4.0–10.5)
nRBC: 0 % (ref 0.0–0.2)

## 2018-09-10 NOTE — Progress Notes (Signed)
[place] PHYSICAL MEDICINE & REHABILITATION PROGRESS NOTE   Subjective/Complaints: Continues to complain of abdominal pain/nodules. Limits his mobility  ROS: Patient denies fever, rash, sore throat, blurred vision, nausea, vomiting, diarrhea, cough, shortness of breath or chest pain,  headache, or mood change.    Objective:   No results found. Recent Labs    09/09/18 0308 09/10/18 0353  WBC 10.3 9.6  HGB 7.8* 7.4*  HCT 26.6* 25.0*  PLT 348 332   Recent Labs    09/08/18 1317  NA 134*  K 3.5  CL 95*  CO2 25  GLUCOSE 94  BUN 49*  CREATININE 6.67*  CALCIUM 8.5*    Intake/Output Summary (Last 24 hours) at 09/10/2018 1002 Last data filed at 09/10/2018 1025 Gross per 24 hour  Intake 240 ml  Output -  Net 240 ml     Physical Exam: Vital Signs Blood pressure 91/61, pulse 92, temperature 98.1 F (36.7 C), temperature source Oral, resp. rate 16, weight 120.2 kg, SpO2 99 %. Constitutional: No distress . Vital signs reviewed. HEENT: EOMI, oral membranes moist Neck: supple Cardiovascular: RRR without murmur. No JVD    Respiratory: CTA Bilaterally without wheezes or rales. Normal effort    GI: BS +, tender, non-distended  Musculoskeletal:  General: No edema.  Comments: Trace LE edema bilaterally remains Neurological: He is alertand oriented to person, place, and time. Nocranial nerve deficit.Coordinationnormal.  UE 5/5. LE: 3/5 HF, 3+ KE and 4/5 ADF/PF bilaterally. No focal sensory findings--no changes Skin: Skin iswarmand dry.  Multiple 1 to 2 cm subcutaneous soft nodules with consistency of lipoma, no skin discoloration no drainage seen at any of the sites.  Psychiatric: pleasant    Assessment/Plan: 1. Functional deficits secondary to debility which require 3+ hours per day of interdisciplinary therapy in a comprehensive inpatient rehab setting.  Physiatrist is providing close team supervision and 24 hour management of active medical problems  listed below.  Physiatrist and rehab team continue to assess barriers to discharge/monitor patient progress toward functional and medical goals  Care Tool:  Bathing    Body parts bathed by patient: Right arm, Left arm, Chest, Abdomen, Front perineal area, Face   Body parts bathed by helper: Buttocks, Right upper leg, Left upper leg, Right lower leg, Left lower leg     Bathing assist Assist Level: Moderate Assistance - Patient 50 - 74%     Upper Body Dressing/Undressing Upper body dressing   What is the patient wearing?: Pull over shirt    Upper body assist Assist Level: Contact Guard/Touching assist    Lower Body Dressing/Undressing Lower body dressing      What is the patient wearing?: Pants     Lower body assist Assist for lower body dressing: Maximal Assistance - Patient 25 - 49%     Toileting Toileting Toileting Activity did not occur (Clothing management and hygiene only): N/A (no void or bm)(HD pt)  Toileting assist Assist for toileting: Dependent - Patient 0%     Transfers Chair/bed transfer  Transfers assist  Chair/bed transfer activity did not occur: Refused  Chair/bed transfer assist level: Maximal Assistance - Patient 25 - 49%(with and without SB)     Locomotion Ambulation   Ambulation assist   Ambulation activity did not occur: Safety/medical concerns          Walk 10 feet activity   Assist  Walk 10 feet activity did not occur: Safety/medical concerns        Walk 50 feet activity  Assist Walk 50 feet with 2 turns activity did not occur: Safety/medical concerns         Walk 150 feet activity   Assist Walk 150 feet activity did not occur: Safety/medical concerns         Walk 10 feet on uneven surface  activity   Assist Walk 10 feet on uneven surfaces activity did not occur: Safety/medical concerns         Wheelchair     Assist     Wheelchair activity did not occur: Refused         Wheelchair 50  feet with 2 turns activity    Assist    Wheelchair 50 feet with 2 turns activity did not occur: Refused       Wheelchair 150 feet activity     Assist Wheelchair 150 feet activity did not occur: Refused        Medical Problem List and Plan: 1.Functional and mobility deficitssecondary to debility after multiple medical issues  -recent hx of bleeding peri-nephric cyst complicated by elevated INR --Continue CIR therapies including PT, OT  2. Antithrombotics: -DVT/anticoagulation:Pharmaceutical:Coumadin INR 2.3, adjust per pharm protocol -antiplatelet therapy: N/A 3. Pain Management:PRN oxycodone--may Need to premedicate prior to therapy.  Ice prn to abdomen/flank.  4. Mood:LCSW to follow for evaluation and support. -antipsychotic agents: N/A 5. Neuropsych: This patientiscapable of making decisions on his own behalf. 6. Skin/Wound Care:Routine pressure relief measures.   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 Fluids/Electrolytes/Nutrition:Strict I/O with daily weights. Continue to offer nephro supplements and pro-stat.  -appreciate RD input  -continue megace trial with some benefit 8. Anemia of chronic disease: Received Feraheme 5/18 and on Aranesp weekly. .  -hgb 7.4 9. CAD s/p AVR/ICM s/p FXT:KWIOXBD HR BID--continue amiodarone daily. On coumadin-- 10. Polycystic kidney disease/ESRD:HD scheduled at end of the day to allow full participation in therapies 11. Chronic hypotension: On midodrine tid--SBP runs in 90's. 12. Gross blood in stool.   -INR therapeutic 2.3 5/25  -HGB trending up  -continue to monitor closely     LOS: 6 days A FACE TO [place] 09/10/2018, 10:02 AM

## 2018-09-10 NOTE — Progress Notes (Signed)
Occupational Therapy Session Note  Patient Details  Name: [name] [name] MRN: [id number] Date of Birth: 1965-10-24  Today's Date: 09/10/2018 OT Individual Time: 9038-3338 OT Individual Time Calculation (min): 55 min    Short Term Goals: Week 1:  OT Short Term Goal 1 (Week 1): Pt will tolerate sitting upright out of bed for 3 hrs a day. OT Short Term Goal 2 (Week 1): Pt will perform sit to stand with mod A +1 for LB dressing  OT Short Term Goal 3 (Week 1): Pt will perform lateral transfers (scoot or squat) with min A  OT Short Term Goal 4 (Week 1): Pt will LB dressing with mod A (sit to stand).  Skilled Therapeutic Interventions/Progress Updates:    1;1. Pt received in bed reporting but not rating pain in L flank. Premedicated by RN. Pt agreeable to bathe and dress. OT completes B feet washing, donning teds and no skid socks. Pt supine>sitting EOB with MOD A for trunk and LLE management. Pt completes UB bathing seated EOB with CGA for sitting balance. Pt dons shirt with CGA for sitting balance. Pt sit>supine with A for BLE. OT threads BLE into pants and educates on dressing LLE first d/t pain and weakness. Pt rolls/partial bridges to get pants past hips with MIN A. Pt completes 3x5 parital bridges with OT bracing feet to keep from sliding. Exited session with pt seated in bed, exit alarm on and call light in reach  Therapy Documentation Precautions:  Precautions Precautions: Fall Precaution Comments: pain along flanks and abdomen; ice as needed Restrictions Weight Bearing Restrictions: No General:   Vital Signs:   Pain:   ADL: ADL Eating: Independent Where Assessed-Eating: Edge of bed Grooming: Setup Where Assessed-Grooming: Sitting at sink Upper Body Bathing: Setup Where Assessed-Upper Body Bathing: Bed level Lower Body Bathing: Maximal assistance Where Assessed-Lower Body Bathing: Bed level Upper Body Dressing: (hospital gown - min A) Where Assessed-Upper Body Dressing:  Chair Lower Body Dressing: Maximal assistance(+2) Where Assessed-Lower Body Dressing: Bed level Toileting: Not assessed Toilet Transfer: Not assessed Tub/Shower Transfer: Not assessed Walk-In Shower Transfer: Not assessed Vision   Perception    Praxis   Exercises:   Other Treatments:     Therapy/Group: Individual Therapy  Tonny Branch 09/10/2018, 9:31 AM

## 2018-09-10 NOTE — Progress Notes (Signed)
Physical Therapy Session Note  Patient Details  Name: [name] [name] MRN: [id number] Date of Birth: [birth year]  Today's Date: 09/10/2018 PT Individual Time: 0300-9233 PT Individual Time Calculation (min): 58 min   Short Term Goals: Week 1:  PT Short Term Goal 1 (Week 1): Pt will initiate OOB mobility PT Short Term Goal 2 (Week 1): Pt will initiate gait training PT Short Term Goal 3 (Week 1): Pt will perform sit<>stand w/ max assist x1 PT Short Term Goal 4 (Week 1): Pt will tolerate 30 min of upright activity w/o increase in fatigue  Skilled Therapeutic Interventions/Progress Updates:    Pt seated in recliner upon PT arrival, agreeable to therapy tx and reports pain 10/10 circling from abdomen to back, therapist offered re-positioning techniques for pain relief. Pt reports he had been sitting up in the recliner and is requesting to get back into bed. Therapist checked BP in sitting- 94/68, pt reports initial dizziness that subsides after a few minutes of sitting up in the recliner chair. Pt performed slideboard transfer to the bed with mod assist, cues for techniques and total assist for slideboard set up. Pt transferred to supine with mod assist for LE management. Pt agreeable to participate in bed level LE and UE strengthening exercises this session, pt performed 2 x 10 of the following exercises with therapist providing cues for techniques and assist as needed: heel slides, active assisted SLR, hip abduction, ankle pumps, bridges (unable to clear hips from bed but able to activate glutes), chest press with 5# dowel, shoulder flexion 5# dowel, and bicep curls with 5# dowel. Throughout session in between exercises therapist provided emotional support and pt discussed his situation, his pain and his recent medical issues. Pt left supine in bed at end of session with needs in reach and bed alarm set.   Therapy Documentation Precautions:  Precautions Precautions: Fall Precaution Comments: pain along  flanks and abdomen; ice as needed Restrictions Weight Bearing Restrictions: No    Therapy/Group: Individual Therapy  Netta Corrigan, PT, DPT 09/10/2018, 12:23 PM

## 2018-09-10 NOTE — Progress Notes (Signed)
[facility] for warfarin Indication: Mechanical valve  No Known Allergies  Patient Measurements: Weight: 265 lb (120.2 kg)  Vital Signs: Temp: 98.1 F (36.7 C) (05/25 0521) Temp Source: Oral (05/25 0521) BP: 91/61 (05/25 0521) Pulse Rate: 92 (05/25 0521)  Labs: Recent Labs    09/08/18 0401 09/08/18 1317 09/09/18 0308 09/10/18 0353  HGB 7.4*  --  7.8* 7.4*  HCT 24.9*  --  26.6* 25.0*  PLT 316  --  348 332  LABPROT 30.8*  --  26.4* 24.8*  INR 3.0*  --  2.5* 2.3*  CREATININE  --  6.67*  --   --    Estimated Creatinine Clearance: 17.4 mL/min (A) (by C-G formula based on SCr of 6.67 mg/dL (H)).  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 Recent admission 4/15-4/21 forsupratherapeutic INR andruptured hemorrhagic renal cyst. Readmitted 5/3 with abdominal pain, gross hematuria, and supratherapeutic INR. CT abdomen at OSH showed increase in size of perinephric hematoma. Patient s/p embolization on 5/13. Last dose warfarin PTA was on 5/2 and patient is s/p 8 mg total of Vitamin K during hospitalization. INR goal has been decreased to 2-2.5 per MD. Warfarin resumed 5/14.    INR 2.3 today,   Hgb 7.8 (low/stable), Pltc WNL  Gross blood in stool 5/21 per notes  FOBT + on 5/22  Per RN, old dried blood near penis 5/24, no active bleeding today 5/25  Per MD, concern for calciphylaxis in long-term dialysis patient, general surgery consult requested for biopsy   Goal of Therapy:  INR 2-2.5 Monitor platelets by anticoagulation protocol: Yes   Plan:  - Per discussion with Dr. Letta Pate 5/24, continue to hold warfarin today 5/25 due to biopsy requested - Daily PT/INR - Follow CBC and for s/sx of bleeding  - Follow up plans on when safe to resume warfarin    Minda Ditto PharmD Clinical Pharmacist Please utilize Amion for appropriate phone number to reach the unit pharmacist ([place]) 09/09/2018 2:23 AM

## 2018-09-10 NOTE — Progress Notes (Signed)
Patient ID: [name] [name], male   DOB: 07-26-1965, 53 y.o.   MRN: [id number]  [facility] Progress Note    Subjective:   No new complaints   Objective:   BP 92/65 (BP Location: Right Arm)   Pulse 95   Temp 98.1 F (36.7 C) (Oral)   Resp 16   Wt 120.2 kg   SpO2 94%   BMI 34.96 kg/m   Intake/Output: I/O last 3 completed shifts: In: 36 [P.O.:260; I.V.:20] Out: -    Intake/Output this shift:  Total I/O In: 140 [P.O.:120; I.V.:20] Out: -  Weight change: -7.197 kg  Physical Exam: Gen: NAD CVS: no rub Resp: cta Abd: benign Ext: no edema, L AVF +T/B  Labs: BMET Recent Labs  Lab 09/04/18 0715 09/06/18 1307 09/08/18 1317  NA 135 136 134*  K 3.6 3.4* 3.5  CL 97* 100 95*  CO2 24 25 25   GLUCOSE 70 95 94  BUN 55* 50* 49*  CREATININE 7.38* 7.07* 6.67*  ALBUMIN 1.4* 1.5* 1.4*  CALCIUM 8.0* 7.8* 8.5*  PHOS 5.2* 4.2 4.0   CBC Recent Labs  Lab 09/06/18 1305 09/07/18 0438 09/08/18 0401 09/09/18 0308 09/10/18 0353  WBC 13.4* 9.3 9.8 10.3 9.6  NEUTROABS 10.8*  --   --   --   --   HGB 7.6* 7.3* 7.4* 7.8* 7.4*  HCT 25.8* 24.4* 24.9* 26.6* 25.0*  MCV 87.5 86.5 87.4 87.5 87.7  PLT 370 320 316 348 332    @IMGRELPRIORS @ Medications:    . acetaminophen  650 mg Oral Q6H  . amiodarone  200 mg Oral Daily  . atorvastatin  80 mg Oral q1800  . Chlorhexidine Gluconate Cloth  6 each Topical Q0600  . cinacalcet  30 mg Oral Q supper  . [START ON 09/11/2018] darbepoetin (ARANESP) injection - DIALYSIS  100 mcg Intravenous Q Tue-HD  . feeding supplement (NEPRO CARB STEADY)  237 mL Oral BID BM  . feeding supplement (PRO-STAT SUGAR FREE 64)  30 mL Oral TID  . lanthanum  1,000 mg Oral TID PC  . megestrol  400 mg Oral BID  . midodrine  15 mg Oral TID WC  . multivitamin  1 tablet Oral QHS  . Warfarin - Pharmacist Dosing Inpatient   Does not apply [id number]     Assessment/ Plan:   1. Left-sided perinephric hematoma- in setting of supratherapeutic INR s/p 7 units of  PRBC's and s/p transcatheter embolization on 08/29/18. 2. ESRD tolerating HD, cont with TTS schedule 3. 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 1. Will need general surgery consult for deep tissue biopsy 4. Anemia: ACD superimprosed with ABLA- on ESA and transfuse prn 5. 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 Nutrition: renal diet 7. Hypertension: stable/chronic hypotension on midodrine 8. AVR- on coumadin 9. Atrial fibrillation- on amio and anticoagulation  Donetta Potts, MD [place] Pager [phone] 09/10/2018, 3:24 PM

## 2018-09-10 NOTE — Progress Notes (Addendum)
Physical Therapy Session Note  Patient Details  Name: [name] [name] MRN: [id number] Date of Birth: 10-04-1965  Today's Date: 09/10/2018 PT Individual Time: 1100-1200 PT Individual Time Calculation (min): 60 min   Short Term Goals: Week 1:  PT Short Term Goal 1 (Week 1): Pt will initiate OOB mobility PT Short Term Goal 2 (Week 1): Pt will initiate gait training PT Short Term Goal 3 (Week 1): Pt will perform sit<>stand w/ max assist x1 PT Short Term Goal 4 (Week 1): Pt will tolerate 30 min of upright activity w/o increase in fatigue      Skilled Therapeutic Interventions/Progress Updates:      Pt resting in bed.  He stated pain 10/10 abdomen.  PT spoke with Tonia, LPN regarding pain meds; administered during session.  She is allso checking on K pad for abdominal pain.  Pt describes vertigo when he sits up.  Quick check of gaze stabilization in supine vertically and horizontally did not result in s/s.  Pt reported a fall a couple of days PTA.  Therapeutic exercise performed with LE to increase strength for functional mobility: supine- 20 x 1 alternating ankle pumps.  Supine> sit using bed features, slowly with min assist.  Pt c/o room spinning upon sitting up x 2 minutes. +2 to assist into supine.  BP 97/71 in supine after approx 2 minutes.  Pt reported that he has hardly been eating, as he is not hungry and does not like the food.  MD mentioned that pt should increase liquid intake due to poor food intake.  PT urged pt to drink water; he drank about 2 oz. PT provided emotional support; pt stated that he felt useless at times.   PT educated pt on tolerance of OOB; pt willing to try again.  Bed mobiltiy as above, but pt rested in partial sitting up for a couple of minutes before attempting EOB.  Slide board transfer +2 bed> recliner.  Recliner immediately reclined and bil LEs elevated.  No c/o of dizziness. Pt positioned partially on R side.  Pt would benefit from roho in recliner  seat.  Pt left resting in recliner with needs at hand.   Therapy Documentation Precautions:  Precautions Precautions: Fall Precaution Comments: pain along flanks and abdomen; ice as needed Restrictions Weight Bearing Restrictions: No     Pain: Pain Assessment Pain Scale: 0-10 Pain Score: 6        Therapy/Group: Individual Therapy  Joellyn Grandt 09/10/2018, 12:06 PM

## 2018-09-11 ENCOUNTER — Inpatient Hospital Stay (HOSPITAL_COMMUNITY): Payer: Medicare Other | Admitting: *Deleted

## 2018-09-11 ENCOUNTER — Encounter (HOSPITAL_COMMUNITY): Payer: Self-pay | Admitting: General Surgery

## 2018-09-11 ENCOUNTER — Inpatient Hospital Stay (HOSPITAL_COMMUNITY): Payer: Medicare Other

## 2018-09-11 ENCOUNTER — Inpatient Hospital Stay (HOSPITAL_COMMUNITY): Payer: Medicare Other | Admitting: Physical Therapy

## 2018-09-11 DIAGNOSIS — N186 End stage renal disease: Secondary | ICD-10-CM | POA: Diagnosis not present

## 2018-09-11 LAB — RENAL FUNCTION PANEL
Albumin: 1.4 g/dL — ABNORMAL LOW (ref 3.5–5.0)
Anion gap: 13 (ref 5–15)
BUN: 68 mg/dL — ABNORMAL HIGH (ref 6–20)
CO2: 24 mmol/L (ref 22–32)
Calcium: 8.3 mg/dL — ABNORMAL LOW (ref 8.9–10.3)
Chloride: 97 mmol/L — ABNORMAL LOW (ref 98–111)
Creatinine, Ser: 8.42 mg/dL — ABNORMAL HIGH (ref 0.61–1.24)
GFR calc Af Amer: 8 mL/min — ABNORMAL LOW (ref >60)
GFR calc non Af Amer: 7 mL/min — ABNORMAL LOW (ref >60)
Glucose, Bld: 85 mg/dL (ref 70–99)
Phosphorus: 4.1 mg/dL (ref 2.5–4.6)
Potassium: 4.3 mmol/L (ref 3.5–5.1)
Sodium: 134 mmol/L — ABNORMAL LOW (ref 135–145)

## 2018-09-11 LAB — CBC
HCT: 24.6 % — ABNORMAL LOW (ref 39.0–52.0)
Hemoglobin: 7.3 g/dL — ABNORMAL LOW (ref 13.0–17.0)
MCH: 25.6 pg — ABNORMAL LOW (ref 26.0–34.0)
MCHC: 29.7 g/dL — ABNORMAL LOW (ref 30.0–36.0)
MCV: 86.3 fL (ref 80.0–100.0)
Platelets: 353 10*3/uL (ref 150–400)
RBC: 2.85 MIL/uL — ABNORMAL LOW (ref 4.22–5.81)
RDW: 18.1 % — ABNORMAL HIGH (ref 11.5–15.5)
WBC: 9.2 10*3/uL (ref 4.0–10.5)
nRBC: 0 % (ref 0.0–0.2)

## 2018-09-11 LAB — PROTIME-INR
INR: 2 — ABNORMAL HIGH (ref 0.8–1.2)
Prothrombin Time: 22.7 seconds — ABNORMAL HIGH (ref 11.4–15.2)

## 2018-09-11 MED ORDER — OXYCODONE HCL 5 MG PO TABS
ORAL_TABLET | ORAL | Status: AC
  Administered 2018-09-11: 10 mg via ORAL
  Filled 2018-09-11: qty 2

## 2018-09-11 MED ORDER — DARBEPOETIN ALFA 100 MCG/0.5ML IJ SOSY
PREFILLED_SYRINGE | INTRAMUSCULAR | Status: AC
  Administered 2018-09-11: 100 ug via INTRAVENOUS
  Filled 2018-09-11: qty 0.5

## 2018-09-11 MED ORDER — WARFARIN SODIUM 2.5 MG PO TABS
2.5000 mg | ORAL_TABLET | Freq: Once | ORAL | Status: AC
  Administered 2018-09-11: 18:00:00 2.5 mg via ORAL
  Filled 2018-09-11: qty 1

## 2018-09-11 NOTE — Consult Note (Signed)
[name] [name] May 01, 1965  [id number].    Requesting MD: Dr. Donato Heinz Chief Complaint/Reason for Consult: calciphylaxis   HPI:  This is a 53 yo black male with a history of ESRD on HD and multiple other medical problems who states he noticed some "knots" coming up on his abdominal wall a couple of days ago.  These are painful.  He denies any fevers or anything like this in the past.  He is being followed by nephrology who has asked [place] to see him for a punch biopsy to confirm a suspicion of calciphylaxis.  ROS: ROS: Please see HPI, otherwise negative right now.  Family History  Problem Relation Age of Onset  . Hypertension Mother   . Heart disease Mother   . Hypertension Father   . Heart disease Father     Past Medical History:  Diagnosis Date  . Aneurysm ([place])     Right arm fistula 3 aneurysms 2011,   plans to have a new procedure  . Angina   . Chest discomfort   . CHF (congestive heart failure) ([place])   . Coronary artery disease   . Coronary artery disease involving native coronary artery of native heart without angina pectoris   . Dialysis patient ([place])   . Dysrhythmia   . ESRD (end stage renal disease) ([place])    on hemodialysis T_T_S  . Hypertension   . Leg pain   . Mitral regurgitation   . Morbid obesity ([place])   . Orthostatic hypotension   . Overweight(278.02)   . Renal insufficiency   . 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 Severe aortic insufficiency   . Sinus tachycardia   . Syncope     positional after dialysis... 2008    Past Surgical History:  Procedure Laterality Date  . A/V FISTULAGRAM Left 06/30/2016   Procedure: A/V Fistulagram;  Surgeon: Algernon Huxley, MD;  Location: [place] [facility];  Service: Cardiovascular;  Laterality: Left;  . AORTIC VALVE REPLACEMENT N/A 09/01/2017   Procedure: AORTIC VALVE  REPLACEMENT (AVR);  Surgeon: [name], MD;  Location: [place];  Service: Open Heart Surgery;  Laterality: N/A;  . ARTERIOVENOUS GRAFT PLACEMENT    . AV FISTULA PLACEMENT    . AV FISTULA PLACEMENT Left 11/18/2015   Procedure: ARTERIOVENOUS (AV) FISTULA CREATION ( RADIOCEPHALIC );  Surgeon: Algernon Huxley, MD;  Location: [facility];  Service: Vascular;  Laterality: Left;  . AV FISTULA PLACEMENT Left 03/23/2016   Procedure: ARTERIOVENOUS (AV) FISTULA CREATION ( REVISION );  Surgeon: Algernon Huxley, MD;  Location: [facility];  Service: Vascular;  Laterality: Left;  . [place] REMOVAL Left 03/23/2016   Procedure: REMOVAL OF ARTERIOVENOUS GORETEX GRAFT ([employer]);  Surgeon: Algernon Huxley, MD;  Location: [facility];  Service: Vascular;  Laterality: Left;  . CENTRAL LINE INSERTION Right 09/01/2017   Procedure: FLOROSCOPY GUIDED PLACEMENT OF RIGHT FEMORAL CENTRAL LINE TIMES TWO  AND PLACEMENT OF SWAN GANZ CATHETER;  Surgeon: [name], MD;  Location: [place];  Service: Open Heart Surgery;  Laterality: Right;  . CORONARY ARTERY BYPASS GRAFT N/A 09/01/2017   Procedure: CORONARY ARTERY BYPASS GRAFTING (CABG) X 1, USING RIGHT GREATER SAPHENOUS VEIN HARVESTED ENDOSCOPICALLY;  Surgeon: [name], MD;  Location: [place];  Service: Open Heart Surgery;  Laterality: N/A;  .  DIALYSIS/PERMA CATHETER REMOVAL N/A 08/01/2016   Procedure: Dialysis/Perma Catheter Removal;  Surgeon: Algernon Huxley, MD;  Location: [place] [facility];  Service: Cardiovascular;  Laterality: N/A;  . HERNIA REPAIR    . INSERTION OF DIALYSIS CATHETER  03/01/2011   Procedure: INSERTION OF DIALYSIS CATHETER;  Surgeon: Elam Dutch, MD;  Location: [place];  Service: Vascular;  Laterality: Left;  Exchange of Dialysis Catheter to 27cm 15Fr. Arrow Catheter  . INSERTION OF DIALYSIS CATHETER  09/01/2017   Procedure: PLACEMENT OF TRIALYSIS SHORT TERM DIALYSIS CATHETER;  Surgeon: [name], MD;  Location: [place];  Service: Open Heart Surgery;;  . IR EMBO ART   VEN HEMORR LYMPH EXTRAV  INC GUIDE ROADMAPPING  08/29/2018  . IR FLUORO GUIDE CV LINE RIGHT  08/27/2018  . IR RENAL SUPRASEL UNI S&I MOD SED  08/29/2018  . IR US GUIDE VASC ACCESS RIGHT  08/27/2018  . IR US GUIDE VASC ACCESS RIGHT  08/29/2018  . MULTIPLE EXTRACTIONS WITH ALVEOLOPLASTY N/A 08/07/2017   Procedure: Extraction of tooth #2, 7,8,13,14,15,17, and 31 with alveoloplasty and gross debridement of remaining teeth;  Surgeon: [name], DDS;  Location: [place];  Service: Oral Surgery;  Laterality: N/A;  . PERIPHERAL VASCULAR CATHETERIZATION N/A 09/01/2014   Procedure: A/V Shuntogram/Fistulagram;  Surgeon: Algernon Huxley, MD;  Location: [place] [facility];  Service: Cardiovascular;  Laterality: N/A;  . PERIPHERAL VASCULAR CATHETERIZATION Right 09/01/2014   Procedure: Thrombectomy;  Surgeon: Algernon Huxley, MD;  Location: [place] [facility];  Service: Cardiovascular;  Laterality: Right;  . PERIPHERAL VASCULAR CATHETERIZATION Right 09/01/2014   Procedure: A/V Shunt Intervention;  Surgeon: Algernon Huxley, MD;  Location: [place] [facility];  Service: Cardiovascular;  Laterality: Right;  . PERIPHERAL VASCULAR CATHETERIZATION N/A 09/17/2014   Procedure: A/V Shuntogram/Fistulagram;  Surgeon: Algernon Huxley, MD;  Location: [place] [facility];  Service: Cardiovascular;  Laterality: N/A;  . PERIPHERAL VASCULAR CATHETERIZATION Right 10/17/2014   Procedure: Thrombectomy;  Surgeon: Katha Cabal, MD;  Location: [place] [facility];  Service: Cardiovascular;  Laterality: Right;  . PERIPHERAL VASCULAR CATHETERIZATION N/A 10/17/2014   Procedure: A/V Shuntogram/Fistulagram;  Surgeon: Katha Cabal, MD;  Location: [place] [facility];  Service: Cardiovascular;  Laterality: N/A;  . PERIPHERAL VASCULAR CATHETERIZATION N/A 10/17/2014   Procedure: A/V Shunt Intervention;  Surgeon: Katha Cabal, MD;  Location: [place] [facility];  Service: Cardiovascular;  Laterality: N/A;  . PERIPHERAL VASCULAR CATHETERIZATION  Right 11/04/2014   Procedure: Thrombectomy;  Surgeon: Katha Cabal, MD;  Location: [place] [facility];  Service: Cardiovascular;  Laterality: Right;  . PERIPHERAL VASCULAR CATHETERIZATION Left 11/04/2014   Procedure: Visceral Venography;  Surgeon: Katha Cabal, MD;  Location: [place] [facility];  Service: Cardiovascular;  Laterality: Left;  . PERIPHERAL VASCULAR CATHETERIZATION Right 11/27/2014   Procedure: A/V Shuntogram/Fistulagram;  Surgeon: Algernon Huxley, MD;  Location: [place] [facility];  Service: Cardiovascular;  Laterality: Right;  . PERIPHERAL VASCULAR CATHETERIZATION Right 11/27/2014   Procedure: A/V Shunt Intervention;  Surgeon: Algernon Huxley, MD;  Location: [place] [facility];  Service: Cardiovascular;  Laterality: Right;  . PERIPHERAL VASCULAR CATHETERIZATION Right 12/31/2014   Procedure: Thrombectomy;  Surgeon: Algernon Huxley, MD;  Location: [place] [facility];  Service: Cardiovascular;  Laterality: Right;  . PERIPHERAL VASCULAR CATHETERIZATION Right 01/12/2015   Procedure: A/V Shuntogram/Fistulagram;  Surgeon: Algernon Huxley, MD;  Location: [place] [facility];  Service: Cardiovascular;  Laterality: Right;  .  PERIPHERAL VASCULAR CATHETERIZATION N/A 01/12/2015   Procedure: A/V Shunt Intervention;  Surgeon: Algernon Huxley, MD;  Location: [place] [facility];  Service: Cardiovascular;  Laterality: N/A;  . PERIPHERAL VASCULAR CATHETERIZATION Right 01/28/2015   Procedure: Thrombectomy;  Surgeon: Algernon Huxley, MD;  Location: [place] [facility];  Service: Cardiovascular;  Laterality: Right;  . PERIPHERAL VASCULAR CATHETERIZATION N/A 06/08/2015   Procedure: A/V Shuntogram/Fistulagram;  Surgeon: Algernon Huxley, MD;  Location: [place] [facility];  Service: Cardiovascular;  Laterality: N/A;  . PERIPHERAL VASCULAR CATHETERIZATION N/A 06/08/2015   Procedure: A/V Shunt Intervention;  Surgeon: Algernon Huxley, MD;  Location: [place] [facility];  Service: Cardiovascular;  Laterality: N/A;  .  PERIPHERAL VASCULAR CATHETERIZATION Right 07/06/2015   Procedure: A/V Shuntogram/Fistulagram;  Surgeon: Algernon Huxley, MD;  Location: [place] [facility];  Service: Cardiovascular;  Laterality: Right;  . PERIPHERAL VASCULAR CATHETERIZATION N/A 07/06/2015   Procedure: A/V Shunt Intervention;  Surgeon: Algernon Huxley, MD;  Location: [place] [facility];  Service: Cardiovascular;  Laterality: N/A;  . PERIPHERAL VASCULAR CATHETERIZATION N/A 08/04/2015   Procedure: graft declot;  Surgeon: Katha Cabal, MD;  Location: [place] [facility];  Service: Cardiovascular;  Laterality: N/A;  . PERIPHERAL VASCULAR CATHETERIZATION Right 08/25/2015   Procedure: A/V Shuntogram/Fistulagram;  Surgeon: Katha Cabal, MD;  Location: [place] [facility];  Service: Cardiovascular;  Laterality: Right;  . PERIPHERAL VASCULAR CATHETERIZATION N/A 11/04/2015   Procedure: Dialysis/Perma Catheter Insertion;  Surgeon: Algernon Huxley, MD;  Location: [place] [facility];  Service: Cardiovascular;  Laterality: N/A;  . PERIPHERAL VASCULAR CATHETERIZATION Left 12/14/2015   Procedure: A/V Shuntogram/Fistulagram;  Surgeon: Algernon Huxley, MD;  Location: [place] [facility];  Service: Cardiovascular;  Laterality: Left;  . PERIPHERAL VASCULAR CATHETERIZATION N/A 12/14/2015   Procedure: A/V Shunt Intervention;  Surgeon: Algernon Huxley, MD;  Location: [place] [facility];  Service: Cardiovascular;  Laterality: N/A;  . PERIPHERAL VASCULAR CATHETERIZATION N/A 12/17/2015   Procedure: Dialysis/Perma Catheter Insertion;  Surgeon: Algernon Huxley, MD;  Location: [place] [facility];  Service: Cardiovascular;  Laterality: N/A;  . PERIPHERAL VASCULAR CATHETERIZATION Left 12/22/2015   Procedure: Dialysis/Perma Catheter Insertion;  Surgeon: Katha Cabal, MD;  Location: [place] [facility];  Service: Cardiovascular;  Laterality: Left;  . PERIPHERAL VASCULAR CATHETERIZATION Left 02/29/2016   Procedure: A/V Shuntogram/Fistulagram;  Surgeon: Algernon Huxley, MD;   Location: [place] [facility];  Service: Cardiovascular;  Laterality: Left;  . PERIPHERAL VASCULAR CATHETERIZATION N/A 02/29/2016   Procedure: A/V Shunt Intervention;  Surgeon: Algernon Huxley, MD;  Location: [place] [facility];  Service: Cardiovascular;  Laterality: N/A;  . POCKET REVISION/RELOCATION N/A 01/01/2018   Procedure: POCKET REVISION/RELOCATION;  Surgeon: Evans Lance, MD;  Location: [place] [facility];  Service: Cardiovascular;  Laterality: N/A;  . right arm graft     for dyalisis  . RIGHT/LEFT HEART CATH AND CORONARY ANGIOGRAPHY N/A 08/02/2017   Procedure: RIGHT/LEFT HEART CATH AND CORONARY ANGIOGRAPHY;  Surgeon: Larey Dresser, MD;  Location: [place] [facility];  Service: Cardiovascular;  Laterality: N/A;  . SUBQ ICD IMPLANT N/A 11/28/2017   Procedure: SUBQ ICD IMPLANT;  Surgeon: Evans Lance, MD;  Location: [place] [facility];  Service: Cardiovascular;  Laterality: N/A;  . SUBQ ICD REVISION N/A 02/12/2018   Procedure: SUBQ ICD REVISION(REMOVAL);  Surgeon: Evans Lance, MD;  Location: [place] [facility];  Service: Cardiovascular;  Laterality: N/A;  . TEE WITHOUT  CARDIOVERSION N/A 07/31/2017   Procedure: TRANSESOPHAGEAL ECHOCARDIOGRAM (TEE);  Surgeon: Josue Hector, MD;  Location: [facility];  Service: Cardiovascular;  Laterality: N/A;  . TEE WITHOUT CARDIOVERSION N/A 09/01/2017   Procedure: TRANSESOPHAGEAL ECHOCARDIOGRAM (TEE);  Surgeon: [name], MD;  Location: [place];  Service: Open Heart Surgery;  Laterality: N/A;  . THROMBECTOMY    . THROMBECTOMY W/ EMBOLECTOMY  03/01/2011   Procedure: THROMBECTOMY ARTERIOVENOUS GORE-TEX GRAFT;  Surgeon: Elam Dutch, MD;  Location: [place];  Service: Vascular;  Laterality: Right;  Attempted Thrombectomy of Old  Right Upper Arm Arteriovenous gortex Graft. Insertion of new Arteriovenous Graft using 40mm x 50cm Gortex Stretch graft.   . THROMBECTOMY W/ EMBOLECTOMY  07/11/2011   Procedure: THROMBECTOMY ARTERIOVENOUS GORE-TEX GRAFT;   Surgeon: Rosetta Posner, MD;  Location: [place];  Service: Vascular;  Laterality: Right;  . UMBILICAL HERNIA REPAIR    . VENOGRAM N/A 08/23/2011   Procedure: VENOGRAM;  Surgeon: [name], MD;  Location: [facility];  Service: Cardiovascular;  Laterality: N/A;    Social History:  reports that he quit smoking about 24 years ago. His smoking use included cigarettes. His smokeless tobacco use includes snuff. He reports that he does not drink alcohol or use drugs.  Allergies: No Known Allergies  Medications Prior to Admission  Medication Sig Dispense Refill  . amiodarone (PACERONE) 200 MG tablet Take 1 tablet (200 mg total) by mouth daily. (Patient taking differently: Take 200 mg by mouth at bedtime. ) 90 tablet 3  . atorvastatin (LIPITOR) 80 MG tablet Take 1 tablet (80 mg total) by mouth daily at 6 PM. (Patient taking differently: Take 80 mg by mouth daily. ) 90 tablet 3  . Cinacalcet HCl (SENSIPAR PO) Take 1 tablet by mouth daily.    . [EXPIRED] HYDROmorphone (DILAUDID) 2 MG tablet Take 0.5 tablets (1 mg total) by mouth every 6 (six) hours as needed for up to 7 days for severe pain. 14 tablet 0  . lanthanum (FOSRENOL) 500 MG chewable tablet Chew 500-1,000 mg by mouth See admin instructions. Take 2 tablets (1000 mg) by mouth three times daily with meals, take 1 tablet (500 mg) with snacks    . midodrine (PROAMATINE) 5 MG tablet Take 3 tablets (15 mg total) by mouth 3 (three) times daily with meals. (Patient taking differently: Take 5 mg by mouth daily. ) 810 tablet 3  . warfarin (COUMADIN) 1 MG tablet Take 1 tablet (1 mg total) by mouth every other day for 30 days. 15 tablet 0     Physical Exam: Blood pressure 110/64, pulse 88, temperature 98.5 F (36.9 C), temperature source Oral, resp. rate 16, weight 120.3 kg, SpO2 100 %. General: pleasant, WD, obese black male who is laying in bed in NAD HEENT: head is normocephalic, atraumatic.  Sclera are noninjected.  PERRL.  Ears and nose without any  masses or lesions.  Mouth is pink and moist Abd: soft, NT, except over several areas of indurated tissue with greatest amount in LLQ, ND, +BS, no masses, hernias, or organomegaly MS: all 4 extremities are symmetrical with no cyanosis, clubbing. Skin: warm and dry with no masses, lesions, or rashes Psych: A&Ox3 with an appropriate affect.   Results for orders placed or performed during the hospital encounter of 09/04/18 (from the past 48 hour(s))  Protime-INR     Status: Abnormal   Collection Time: 09/10/18  3:53 AM  Result Value Ref Range   Prothrombin Time 24.8 (H) 11.4 -  15.2 seconds   INR 2.3 (H) 0.8 - 1.2    Comment: (NOTE) INR goal varies based on device and disease states. Performed at [facility], [place] [address], [place], [place] [address]   CBC     Status: Abnormal   Collection Time: 09/10/18  3:53 AM  Result Value Ref Range   WBC 9.6 4.0 - 10.5 K/uL   RBC 2.85 (L) 4.22 - 5.81 MIL/uL   Hemoglobin 7.4 (L) 13.0 - 17.0 g/dL   HCT 25.0 (L) 39.0 - 52.0 %   MCV 87.7 80.0 - 100.0 fL   MCH 26.0 26.0 - 34.0 pg   MCHC 29.6 (L) 30.0 - 36.0 g/dL   RDW 18.3 (H) 11.5 - 15.5 %   Platelets 332 150 - 400 K/uL   nRBC 0.0 0.0 - 0.2 %    Comment: Performed at [facility], [place] [address], [place], [place] [address]  Protime-INR     Status: Abnormal   Collection Time: 09/11/18  3:30 AM  Result Value Ref Range   Prothrombin Time 22.7 (H) 11.4 - 15.2 seconds   INR 2.0 (H) 0.8 - 1.2    Comment: (NOTE) INR goal varies based on device and disease states. Performed at [facility], [place] [address] [place], [place] [address]    No results found.    Assessment/Plan Abdominal wall lesions -punch biopsy done and will send for path to determine if this is calciphylaxis -see procedure note. -no further surgical needs -dry dressing or band-aid can be placed over site starting tomorrow.  No other care needed. -call if needed  Procedure: Pre-op DX: abdominal wall  lesions Post op DX: SAA Surgeon: Saverio Danker, PA-C Indications: this is a 53 yo male with a history of ESRD on HD who has developed very tender indurated areas on his abdominal wall concerning for possible calciphylaxis.  Biopsy has been requested.  Description of procedure: the patient was prepped and draped in the usual sterile fashion.  1 % lidocaine was used to anesthetize the area of skin in the LLQ abdominal wall.  After this a 14mm punch tool was used to excise a small sample of tissue.  This was placed in formalin.  Hemostasis was achieved and a dry dressing placed.  The patient tolerated the procedure well.  Complications: none EBL: minimal  Copy of path report send to Dr. Marval Regal and Dr. Lattie Haw, [facility] Surgery 09/11/2018, 9:12 AM Pager: [phone]

## 2018-09-11 NOTE — Progress Notes (Signed)
Occupational Therapy Session Note  Patient Details  Name: [name] [name] MRN: [id number] Date of Birth: 09/19/1965  Today's Date: 09/11/2018 OT Individual Time: 0700-0810 OT Individual Time Calculation (min): 70 min    Short Term Goals: Week 1:  OT Short Term Goal 1 (Week 1): Pt will tolerate sitting upright out of bed for 3 hrs a day. OT Short Term Goal 2 (Week 1): Pt will perform sit to stand with mod A +1 for LB dressing  OT Short Term Goal 3 (Week 1): Pt will perform lateral transfers (scoot or squat) with min A  OT Short Term Goal 4 (Week 1): Pt will LB dressing with mod A (sit to stand).  Skilled Therapeutic Interventions/Progress Updates:    Pt resting in bed upon arrival.  Attempted to engage pt in bathing/dressing tasks at bed level and seated EOB.  Pt stated that his pain was too intense this morning to sit EOB.  Pt assisted with donning shorts.  Pt assisted with repositioning in bed.  Pt performs rolling in bed without assistance. Continued education regarding importance of OOB activity but pt states that pain is too intense. Pt performed BUE there with 5# bar; chest presses and overhead presses 3X10. Pt remained in bed with Kpad applied to abdomen and all needs within reach.   Therapy Documentation Precautions:  Precautions Precautions: Fall Precaution Comments: pain along flanks and abdomen; ice as needed Restrictions Weight Bearing Restrictions: No Pain:  Pt c/o 7/10 pain in abdomen and wrapping around to L back; MD aware and heat applies (Kpad and hot pack)   Therapy/Group: Individual Therapy  [name] 09/11/2018, 8:12 AM

## 2018-09-11 NOTE — Progress Notes (Signed)
Pt being transported to HD via bed. Will f/up when return.   Rosita Fire, RN

## 2018-09-11 NOTE — Progress Notes (Signed)
Pt returned from HD. Returned to room via bed. Call bell placed in reach. Will cont to monitor  Rosita Fire, RN

## 2018-09-11 NOTE — Progress Notes (Signed)
drsg to LLQ abdomen saturated, area cleansed with NS and dry drsg applied. Will cont to monitor for bleeding.  Rosita Fire, RN

## 2018-09-11 NOTE — Progress Notes (Signed)
Occupational Therapy Session Note  Patient Details  Name: [name] [name] MRN: [id number] Date of Birth: [birth year]  Today's Date: 09/11/2018 OT Individual Time: 2197-5883 OT Individual Time Calculation (min): 40 min    Short Term Goals: Week 1:  OT Short Term Goal 1 (Week 1): Pt will tolerate sitting upright out of bed for 3 hrs a day. OT Short Term Goal 1 - Progress (Week 1): Progressing toward goal OT Short Term Goal 2 (Week 1): Pt will perform sit to stand with mod A +1 for LB dressing  OT Short Term Goal 2 - Progress (Week 1): Progressing toward goal OT Short Term Goal 3 (Week 1): Pt will perform lateral transfers (scoot or squat) with min A  OT Short Term Goal 3 - Progress (Week 1): Progressing toward goal OT Short Term Goal 4 (Week 1): Pt will LB dressing with mod A (sit to stand). OT Short Term Goal 4 - Progress (Week 1): Progressing toward goal  Skilled Therapeutic Interventions/Progress Updates:    Pt resting in bed upon arrival with Kpad on abdomen.  OT intervention with focus on BUE therex with green theraband.  Pt instructed in following exercises: chest presses, biceps curls, triceps, and diagonals.  Pt performed 3 X 10 for each exercise.  Pt educated on importance of OOB activities.  Pt verbalized understanding but reluctant this morning 2/2 ongoing increased abdominal pain.   Therapy Documentation Precautions:  Precautions Precautions: Fall Precaution Comments: pain along flanks and abdomen; ice as needed Restrictions Weight Bearing Restrictions: No   Pain: Pain Assessment Pain Scale: 0-10 Pain Score: 9  Pain Type: Acute pain Pain Location: Abdomen Pain Orientation: Left Pain Descriptors / Indicators: Discomfort;Tender Pain Frequency: Constant Pain Onset: On-going Patients Stated Pain Goal: 6 Pain Intervention(s): Heat applied   Therapy/Group: Individual Therapy  [name] 09/11/2018, 12:10 PM

## 2018-09-11 NOTE — Progress Notes (Signed)
Nutrition Follow-up  DOCUMENTATION CODES:   Severe malnutrition in context of acute illness/injury, Obesity unspecified  INTERVENTION:   - Communicated with MD, no plan to pursue Cortrak at this time  - Recommend diet liberalization to Regular to promote increased po intake  - Continue Nepro Shake po BID, each supplement provides 425 kcal and 19 grams protein  - Continue Pro-stat 30 ml TID, each supplement provides 100 kcal and 15 grams of protein  - Continue renal MVI daily  - Add Magic cup BID with meals, each supplement provides 290 kcal and 9 grams of protein  - Add double protein portions TID with all meals  - Encourage adequate PO intake  NUTRITION DIAGNOSIS:   Severe Malnutrition related to acute illness as evidenced by energy intake < or equal to 50% for > or equal to 5 days, percent weight loss (4.1% weight loss in less than 1 week).  New diagnosis given prolonged poor PO intake and significant weight loss  GOAL:   Patient will meet greater than or equal to 90% of their needs  Progressing  MONITOR:   PO intake, Supplement acceptance, Labs, Weight trends, I & O's, Skin  REASON FOR ASSESSMENT:   Malnutrition Screening Tool, Consult Poor PO  ASSESSMENT:   53 year old male with PMH of ESRD due to polycystic kidney disease on HD TTS, HTN, CAD/AS s/p CABG with mechanical AVR on chronic coumadin. Pt with recent admission 4/15-4/21 forsupratherapeutic INR andruptured hemorrhagic renal cyst. Pt was readmitted on 08/19/18 with tachycardia, fall a few days PTA with difficulty walking and inability to get to HD, LUQ and flank pain radiating to back with hematuria.CT abdomen at OSH showed increase in size of perinephric hematoma and INR noted to be supratherapeutic. Pt underwent left renal embolization on 5/13. Pt admitted to CIR on 5/19.  Megace trial started 5/21 per MD.  Noted pt with tender subcutaneous nodules to abdomen. 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 Sensipar and sodium thiosulfate started with dialysis. Punch biopsy performed today per Surgery.  Pt receiving HD today.  Weight down 11 lbs since admission to CIR. This is a 4.1% weight loss in less than 1 week which is significant for timeframe. Weight has steadily been declining starting in January 2020. Given ongoing poor Po intake and significant weight loss since admission, pt now meets criteria for malnutrition.  Meal Completion: 0-50% x last 7 recorded meals  Medications reviewed and include: Sensipar, Nepro BID (pt refusing most), Pro-stat 30 ml TID, Megace 400 mg BID, Fosrenol, rena-vit, warfarin, sodium thiosulfate 25 grams every T/TH/Sa  Labs reviewed: sodium 134 (L), hemoglobin 7.3 (L)  Diet Order:   Diet Order            Diet renal with fluid restriction Fluid restriction: 1200 mL Fluid; Room service appropriate? Yes; Fluid consistency: Thin  Diet effective now              EDUCATION NEEDS:   Not appropriate for education at this time  Skin:  Skin Assessment: Skin Integrity Issues: Stage II: right buttocks Incisions: right groin, chest Other: blood blister to left flank, puncture wound to abdomen from punch biospy  Last BM:  09/08/18  Height:   Ht Readings from Last 1 Encounters:  08/21/18 6\' 1"  (1.854 m)    Weight:   Wt Readings from Last 1 Encounters:  09/11/18 123 kg    Ideal Body Weight:  83.6 kg  BMI:  Body mass index is 35.78 kg/m.  Estimated Nutritional Needs:  Kcal:  2400-2600  Protein:  120-135 grams  Fluid:  UOP + 1000 ml    Gaynell Face, MS, RD, LDN Inpatient Clinical Dietitian Pager: [phone] Weekend/After Hours: [phone]

## 2018-09-11 NOTE — Progress Notes (Signed)
Occupational Therapy Weekly Progress Note  Patient Details  Name: [name] [name] MRN: [id number] Date of Birth: Jan 31, 1966  Beginning of progress report period: Sep 05, 2018 End of progress report period: Sep 11, 2018  Patient has met 0 of 4 short term goals.  Pt continues to c/o increased abdominal and back pain which is inhibiting OOB therapy.  Pt requires min A for slide board transfers to w/c/recliner. Pt requires min A for supine>sit EOB . Pt has been able to tolerate sitting in recliner for approx 2 hours on some days. LB bathing/dressing tasks have been completed at bed level 2/2 increased pain and dizziness when sitting EOB.   Patient continues to demonstrate the following deficits: muscle weakness and acute pain, decreased cardiorespiratoy endurance and decreased balance strategies and therefore will continue to benefit from skilled OT intervention to enhance overall performance with BADL and Reduce care partner burden.  Patient progressing toward long term goals..  Continue plan of care.  OT Short Term Goals Week 1:  OT Short Term Goal 1 (Week 1): Pt will tolerate sitting upright out of bed for 3 hrs a day. OT Short Term Goal 1 - Progress (Week 1): Progressing toward goal OT Short Term Goal 2 (Week 1): Pt will perform sit to stand with mod A +1 for LB dressing  OT Short Term Goal 2 - Progress (Week 1): Progressing toward goal OT Short Term Goal 3 (Week 1): Pt will perform lateral transfers (scoot or squat) with min A  OT Short Term Goal 3 - Progress (Week 1): Progressing toward goal OT Short Term Goal 4 (Week 1): Pt will LB dressing with mod A (sit to stand). OT Short Term Goal 4 - Progress (Week 1): Progressing toward goal Week 2:  OT Short Term Goal 1 (Week 2): Pt will tolerate sitting upright out of bed for 3 hrs a day. OT Short Term Goal 2 (Week 2): Pt will perform sit to stand with mod A +1 for LB dressing  OT Short Term Goal 3 (Week 2): Pt will perform lateral transfers (scoot  or squat) with min A  OT Short Term Goal 4 (Week 2): Pt will LB dressing with mod A (sit to stand).     Leotis Shames [facility] 09/11/2018, 8:15 AM

## 2018-09-11 NOTE — Progress Notes (Signed)
Physical Therapy Session Note  Patient Details  Name: [name] [name] MRN: [id number] Date of Birth: 1966-01-30  Today's Date: 09/11/2018 PT Individual Time: 5176-1607 PT Individual Time Calculation (min): 58 min   Short Term Goals: Week 1:  PT Short Term Goal 1 (Week 1): Pt will initiate OOB mobility PT Short Term Goal 2 (Week 1): Pt will initiate gait training PT Short Term Goal 3 (Week 1): Pt will perform sit<>stand w/ max assist x1 PT Short Term Goal 4 (Week 1): Pt will tolerate 30 min of upright activity w/o increase in fatigue  Skilled Therapeutic Interventions/Progress Updates:    Pt received supine in bed reporting 9.5/10 pain in abdomen due to punch biopsy procedure this AM; however, agreeable to bed level therapy session. Reinforced prior education regarding sidelying for pressure relief with pt reporting increased difficulty achieving position in airbed. Nursing staff present providing pt breakfast and therapist encouraged pt to eat - pt ate 1/2 of [employer] sandwich. Performed supine B LE exercises 2x10 repetitions each: ankle DF/PF, heel slides, hip abduction/adduction. Supine>sit EOB using bed features with close supervision for safety. Pt tolerated sitting on EOB ~7 minutes using B UE support and close supervision for safety - after ~88minutes of sitting pt reported onset of vertigo "the room spinning" but no visible nystagmus. While sitting on EOB pt performed B LE LAQs 2x10 repetitions each. Therapist encouraged pt to attempt sit>stand transfer; however, pt deferring at this time stating "I don't feel up to it today" - therapist continued to educate pt on importance of OOB mobility to maintain functional mobility level. Sit>supine with CGA for B LE management. Pt left supine in bed with needs in reach, heating pad applied to abdomen, and bed alarm on. Nursing staff notified to set bed on rolling schedule to assist with pressure relief.  Therapy Documentation Precautions:   Precautions Precautions: Fall Precaution Comments: pain along flanks and abdomen; ice as needed Restrictions Weight Bearing Restrictions: No  Pain: 9.5/10 - Details above.    Therapy/Group: Individual Therapy  Tawana Scale, PT, DPT 09/11/2018, 9:27 AM

## 2018-09-11 NOTE — Procedures (Signed)
I was present at this dialysis session. I have reviewed the session itself and made appropriate changes.   Vital signs in last 24 hours:  Temp:  [98 F (36.7 C)-98.5 F (36.9 C)] 98.5 F (36.9 C) (05/26 0514) Pulse Rate:  [88] 88 (05/26 0514) Resp:  [16] 16 (05/26 0514) BP: (109-110)/(64-84) 110/64 (05/26 0514) SpO2:  [100 %] 100 % (05/26 0514) Weight:  [120.3 kg] 120.3 kg (05/26 0514) Weight change: 0.118 kg Filed Weights   09/09/18 0505 09/10/18 0500 09/11/18 0514  Weight: 124.2 kg 120.2 kg 120.3 kg    Recent Labs  Lab 09/11/18 1304  NA 134*  K 4.3  CL 97*  CO2 24  GLUCOSE 85  BUN 68*  CREATININE 8.42*  CALCIUM 8.3*  PHOS 4.1    Recent Labs  Lab 09/06/18 1305  09/09/18 0308 09/10/18 0353 09/11/18 1304  WBC 13.4*   < > 10.3 9.6 9.2  NEUTROABS 10.8*  --   --   --   --   HGB 7.6*   < > 7.8* 7.4* 7.3*  HCT 25.8*   < > 26.6* 25.0* 24.6*  MCV 87.5   < > 87.5 87.7 86.3  PLT 370   < > 348 332 353   < > = values in this interval not displayed.    Scheduled Meds: . acetaminophen  650 mg Oral Q6H  . amiodarone  200 mg Oral Daily  . atorvastatin  80 mg Oral q1800  . Chlorhexidine Gluconate Cloth  6 each Topical Q0600  . cinacalcet  30 mg Oral Q supper  . darbepoetin (ARANESP) injection - DIALYSIS  100 mcg Intravenous Q Tue-HD  . feeding supplement (NEPRO CARB STEADY)  237 mL Oral BID BM  . feeding supplement (PRO-STAT SUGAR FREE 64)  30 mL Oral TID  . lanthanum  1,000 mg Oral TID PC  . megestrol  400 mg Oral BID  . midodrine  15 mg Oral TID WC  . multivitamin  1 tablet Oral QHS  . warfarin  2.5 mg Oral Once  . Warfarin - Pharmacist Dosing Inpatient   Does not apply q1800   Continuous Infusions: . sodium thiosulfate infusion for calciphylaxis     PRN Meds:.acetaminophen, alum & mag hydroxide-simeth, bisacodyl, diphenhydrAMINE, guaiFENesin-dextromethorphan, lanthanum, oxyCODONE, polyethylene glycol, prochlorperazine **OR** prochlorperazine **OR**  prochlorperazine, sodium chloride flush, traMADol, traZODone      Assessment/ Plan:   1. Left-sided perinephric hematoma- in setting of supratherapeutic INR s/p 7 units of PRBC's and s/p transcatheter embolization on 08/29/18. 2. ESRD tolerating HD, cont with TTS schedule 3. 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 1. S/p deep tissue biopsy today and appreciate General Surgery's assistance. 4. Anemia: ACD superimprosed with ABLA- on ESA and transfuse prn 5. 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 Nutrition: renal diet 7. Hypertension: stable/chronic hypotension on midodrine 8. AVR- on coumadin 9. Atrial fibrillation- on amio and anticoagulation  Donetta Potts,  MD 09/11/2018, 1:58 PM

## 2018-09-11 NOTE — Progress Notes (Signed)
[place] PHYSICAL MEDICINE & REHABILITATION PROGRESS NOTE   Subjective/Complaints: Still struggling with abdominal pain d/t nodules. Heating pad helps somewhat.   ROS: Patient denies fever, rash, sore throat, blurred vision, nausea, vomiting, diarrhea, cough, shortness of breath or chest pain, joint or back pain, headache, or mood change.    Objective:   No results found. Recent Labs    09/09/18 0308 09/10/18 0353  WBC 10.3 9.6  HGB 7.8* 7.4*  HCT 26.6* 25.0*  PLT 348 332   Recent Labs    09/08/18 1317  NA 134*  K 3.5  CL 95*  CO2 25  GLUCOSE 94  BUN 49*  CREATININE 6.67*  CALCIUM 8.5*    Intake/Output Summary (Last 24 hours) at 09/11/2018 0919 Last data filed at 09/11/2018 0754 Gross per 24 hour  Intake 140 ml  Output -  Net 140 ml     Physical Exam: Vital Signs Blood pressure 110/64, pulse 88, temperature 98.5 F (36.9 C), temperature source Oral, resp. rate 16, weight 120.3 kg, SpO2 100 %. Constitutional: No distress . Vital signs reviewed. HEENT: EOMI, oral membranes moist Neck: supple Cardiovascular: RRR without murmur. No JVD    Respiratory: CTA Bilaterally without wheezes or rales. Normal effort    GI: BS +,  tender, non-distended, palpable nodules sq Musculoskeletal:  General: No edema.  Comments: Trace LE edema bilaterally remains Neurological: He is alertand oriented to person, place, and time. Nocranial nerve deficit.Coordinationnormal.  UE 5/5. LE: 3/5 HF, 3+ KE and 4/5 ADF/PF bilaterally. No focal sensory findings--no changes Skin: Skin iswarmand dry.  Multiple 1 to 2 cm subcutaneous soft nodules as above Psychiatric: generally pleasant    Assessment/Plan: 1. Functional deficits secondary to debility which require 3+ hours per day of interdisciplinary therapy in a comprehensive inpatient rehab setting.  Physiatrist is providing close team supervision and 24 hour management of active medical problems listed  below.  Physiatrist and rehab team continue to assess barriers to discharge/monitor patient progress toward functional and medical goals  Care Tool:  Bathing    Body parts bathed by patient: Right arm, Left arm, Chest, Abdomen, Front perineal area, Face   Body parts bathed by helper: Buttocks, Right upper leg, Left upper leg, Right lower leg, Left lower leg     Bathing assist Assist Level: Moderate Assistance - Patient 50 - 74%     Upper Body Dressing/Undressing Upper body dressing   What is the patient wearing?: Pull over shirt    Upper body assist Assist Level: Contact Guard/Touching assist    Lower Body Dressing/Undressing Lower body dressing      What is the patient wearing?: Pants     Lower body assist Assist for lower body dressing: Maximal Assistance - Patient 25 - 49%     Toileting Toileting Toileting Activity did not occur (Clothing management and hygiene only): N/A (no void or bm)(HD pt)  Toileting assist Assist for toileting: Dependent - Patient 0%     Transfers Chair/bed transfer  Transfers assist  Chair/bed transfer activity did not occur: Refused  Chair/bed transfer assist level: Moderate Assistance - Patient 50 - 74%     Locomotion Ambulation   Ambulation assist   Ambulation activity did not occur: Safety/medical concerns          Walk 10 feet activity   Assist  Walk 10 feet activity did not occur: Safety/medical concerns        Walk 50 feet activity   Assist Walk 50 feet with 2 turns  activity did not occur: Safety/medical concerns         Walk 150 feet activity   Assist Walk 150 feet activity did not occur: Safety/medical concerns         Walk 10 feet on uneven surface  activity   Assist Walk 10 feet on uneven surfaces activity did not occur: Safety/medical concerns         Wheelchair     Assist     Wheelchair activity did not occur: Refused         Wheelchair 50 feet with 2 turns  activity    Assist    Wheelchair 50 feet with 2 turns activity did not occur: Refused       Wheelchair 150 feet activity     Assist Wheelchair 150 feet activity did not occur: Refused        Medical Problem List and Plan: 1.Functional and mobility deficitssecondary to debility after multiple medical issues  -recent hx of bleeding peri-nephric cyst complicated by elevated INR --Continue CIR therapies including PT, OT, team conf today  2. Antithrombotics: -DVT/anticoagulation:Pharmaceutical:Coumadin INR 2.0 today, adjust per pharm protocol -antiplatelet therapy: N/A 3. Pain Management:PRN oxycodone--may Need to premedicate prior to therapy.  Ice, heat prn to abdomen/flank.  4. Mood:LCSW to follow for evaluation and support. -antipsychotic agents: N/A 5. Neuropsych: This patientiscapable of making decisions on his own behalf. 6. Skin/Wound Care:Routine pressure relief measures.   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 Fluids/Electrolytes/Nutrition:Strict I/O with daily weights. Continue to offer nephro supplements and pro-stat.  -appreciate RD input  -continue megace trial with some benefit 8. Anemia of chronic disease: Received Feraheme 5/18 and on Aranesp weekly. .  -hgb 7.4 9. CAD s/p AVR/ICM s/p XFG:HWEXHBZ HR BID--continue amiodarone daily. On coumadin-- 10. Polycystic kidney disease/ESRD:HD scheduled at end of the day to allow full participation in therapies 11. Chronic hypotension: On midodrine tid--SBP runs in 90's. 12. Gross blood in stool.    -HGB trending up  -continue to monitor closely     LOS: 7 days A FACE TO [place] 09/11/2018, 9:19 AM

## 2018-09-11 NOTE — Progress Notes (Signed)
[place] for warfarin Indication: Mechanical valve  No Known Allergies  Patient Measurements: Weight: 265 lb 4.2 oz (120.3 kg)  Vital Signs: Temp: 98.5 F (36.9 C) (05/26 0514) Temp Source: Oral (05/26 0514) BP: 110/64 (05/26 0514) Pulse Rate: 88 (05/26 0514)  Labs: Recent Labs    09/08/18 1317 09/09/18 0308 09/10/18 0353 09/11/18 0330  HGB  --  7.8* 7.4*  --   HCT  --  26.6* 25.0*  --   PLT  --  348 332  --   LABPROT  --  26.4* 24.8* 22.7*  INR  --  2.5* 2.3* 2.0*  CREATININE 6.67*  --   --   --    Estimated Creatinine Clearance: 17.4 mL/min (A) (by C-G formula based on SCr of 6.67 mg/dL (H)).  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 Recent admission 4/15-4/21 forsupratherapeutic INR andruptured hemorrhagic renal cyst. Readmitted 5/3 with abdominal pain, gross hematuria, and supratherapeutic INR. CT abdomen at OSH showed increase in size of perinephric hematoma. Patient s/p embolization on 5/13. Last dose warfarin PTA was on 5/2 and patient is s/p 8 mg total of Vitamin K during hospitalization. INR goal has been decreased to 2-2.5 per MD. Warfarin resumed 5/14.    INR 2.0 today,   Hgb 7.4 (5/25, low/stable), Pltc WNL  Gross blood in stool 5/21 per notes  FOBT + on 5/22  Warfarin held 5/24 (INR 2.5) for probable biopsy (d/w MD), INR 2.3 on 5/26, Warf held again  Concern for calciphylaxis in long-term dialysis patient, general surgery consult requested for biopsy - punch biopsy, unlikely to need lower INR  Last Warfarin dose 5/18  Goal of Therapy:  INR 2-2.5 Monitor platelets by anticoagulation protocol: Yes   Plan:   Warfarin 2.5mg  today  Daily PT/INR  Daily CBC   Minda Ditto PharmD Clinical Pharmacist Phone [phone] Please utilize Amion for appropriate phone number to reach the unit pharmacist ([place]) 09/09/2018 2:23 AM

## 2018-09-12 ENCOUNTER — Inpatient Hospital Stay (HOSPITAL_COMMUNITY): Payer: Medicare Other

## 2018-09-12 ENCOUNTER — Inpatient Hospital Stay (HOSPITAL_COMMUNITY): Payer: Medicare Other | Admitting: *Deleted

## 2018-09-12 LAB — PROTIME-INR
INR: 2 — ABNORMAL HIGH (ref 0.8–1.2)
Prothrombin Time: 22.3 seconds — ABNORMAL HIGH (ref 11.4–15.2)

## 2018-09-12 LAB — CBC
HCT: 24.8 % — ABNORMAL LOW (ref 39.0–52.0)
Hemoglobin: 7.5 g/dL — ABNORMAL LOW (ref 13.0–17.0)
MCH: 25.3 pg — ABNORMAL LOW (ref 26.0–34.0)
MCHC: 30.2 g/dL (ref 30.0–36.0)
MCV: 83.5 fL (ref 80.0–100.0)
Platelets: 283 10*3/uL (ref 150–400)
RBC: 2.97 MIL/uL — ABNORMAL LOW (ref 4.22–5.81)
RDW: 18 % — ABNORMAL HIGH (ref 11.5–15.5)
WBC: 7.4 10*3/uL (ref 4.0–10.5)
nRBC: 0 % (ref 0.0–0.2)

## 2018-09-12 LAB — HEPATITIS B SURFACE ANTIGEN: Hepatitis B Surface Ag: NEGATIVE

## 2018-09-12 MED ORDER — WARFARIN SODIUM 2.5 MG PO TABS
2.5000 mg | ORAL_TABLET | Freq: Once | ORAL | Status: AC
  Administered 2018-09-12: 2.5 mg via ORAL
  Filled 2018-09-12: qty 1

## 2018-09-12 MED ORDER — MORPHINE SULFATE ER 15 MG PO TBCR
15.0000 mg | EXTENDED_RELEASE_TABLET | Freq: Two times a day (BID) | ORAL | Status: DC
  Administered 2018-09-12 – 2018-09-17 (×11): 15 mg via ORAL
  Filled 2018-09-12 (×11): qty 1

## 2018-09-12 NOTE — Progress Notes (Signed)
Physical Therapy Session Note  Patient Details  Name: [name] [name] MRN: [id number] Date of Birth: Jul 24, 1965  Today's Date: 09/12/2018 PT Individual Time: 1505-1520 PT Individual Time Calculation (min): 15 min   Short Term Goals: Week 1:  PT Short Term Goal 1 (Week 1): Pt will initiate OOB mobility PT Short Term Goal 1 - Progress (Week 1): Met PT Short Term Goal 2 (Week 1): Pt will initiate gait training PT Short Term Goal 2 - Progress (Week 1): Not met PT Short Term Goal 3 (Week 1): Pt will perform sit<>stand w/ max assist x1 PT Short Term Goal 3 - Progress (Week 1): Not met PT Short Term Goal 4 (Week 1): Pt will tolerate 30 min of upright activity w/o increase in fatigue PT Short Term Goal 4 - Progress (Week 1): Not met  Skilled Therapeutic Interventions/Progress Updates: Pt presented in bed reluctant to participate in therapy. Pt stating has done plenty of "arm and leg" exercises today and refusing to try and sit at EOB. Discussed theraputic benefits of increasing upright tolerance and progression to mobility and that not all therapies should revolve around bed therex. Pt frustrated with changing diagnosis of nodules on abdomen. Session ended with pt in bed with needs met.      Therapy Documentation Precautions:  Precautions Precautions: Fall Precaution Comments: pain along flanks and abdomen; ice as needed Restrictions Weight Bearing Restrictions: No General: PT Amount of Missed Time (min): 15 Minutes PT Missed Treatment Reason: Patient unwilling to participate Vital Signs: Therapy Vitals Temp: 98.4 F (36.9 C) Temp Source: Oral Pulse Rate: 95 Resp: 18 BP: (!) 92/53 Patient Position (if appropriate): Lying Oxygen Therapy SpO2: 97 % O2 Device: Room Air Pain: Pain Assessment Pain Scale: 0-10 Pain Score: 6  Pain Location: Abdomen Pain Orientation: Right;Left Pain Descriptors / Indicators: Stabbing;Tender Pain Frequency: Constant Pain Onset: On-going Patients  Stated Pain Goal: 7 Pain Intervention(s): Medication (See eMAR)    Therapy/Group: Individual Therapy  Destin Vinsant  Dredyn Gubbels, PTA  09/12/2018, 3:59 PM

## 2018-09-12 NOTE — Progress Notes (Signed)
Occupational Therapy Session Note  Patient Details  Name: [name] [name] MRN: [id number] Date of Birth: [birth year]  Today's Date: 09/12/2018 OT Individual Time: 8675-4492 OT Individual Time Calculation (min): 55 min    Short Term Goals: Week 2:  OT Short Term Goal 1 (Week 2): Pt will tolerate sitting upright out of bed for 3 hrs a day. OT Short Term Goal 2 (Week 2): Pt will perform sit to stand with mod A +1 for LB dressing  OT Short Term Goal 3 (Week 2): Pt will perform lateral transfers (scoot or squat) with min A  OT Short Term Goal 4 (Week 2): Pt will LB dressing with mod A (sit to stand).  Skilled Therapeutic Interventions/Progress Updates:    Pt resting in bed upon arrival.  BP (supine) 81/58 and 85/54.  Pt engaged in bed level activities including bed mobility and BUE therex with 2 kg ball.  Pt requires min A for rolling R<>L with bed rails and repositioning in bed. Pt also engaged in BUE therex: chest presses, overhead presses, and biceps curls. Pt continues to express concern regarding health status. Pt remained in bed with all needs within reach.   Therapy Documentation Precautions:  Precautions Precautions: Fall Precaution Comments: pain along flanks and abdomen; ice as needed Restrictions Weight Bearing Restrictions: No  Pain: Pain Assessment Pain Scale: 0-10 Pain Score: 8  Pain Type: Acute pain Pain Location: Abdomen Pain Orientation: Right;Left Pain Descriptors / Indicators: Sharp;Tender Pain Frequency: Constant Pain Onset: On-going Patients Stated Pain Goal: 6 Pain Intervention(s): ;Repositioned; Kpad   Therapy/Group: Individual Therapy  [name] 09/12/2018, 11:49 AM

## 2018-09-12 NOTE — Progress Notes (Signed)
ANTICOAGULATION CONSULT NOTE  Pharmacy Consult for warfarin Indication: Mechanical valve  No Known Allergies  Patient Measurements: Weight: 266 lb 12.1 oz (121 kg)  Vital Signs: Temp: 98.3 F (36.8 C) (05/27 0534) Temp Source: Oral (05/26 1949) BP: 103/52 (05/27 0534) Pulse Rate: 101 (05/27 0534)  Labs: Recent Labs    09/10/18 0353 09/11/18 0330 09/11/18 1304 09/12/18 0530  HGB 7.4*  --  7.3* 7.5*  HCT 25.0*  --  24.6* 24.8*  PLT 332  --  353 283  LABPROT 24.8* 22.7*  --  22.3*  INR 2.3* 2.0*  --  2.0*  CREATININE  --   --  8.42*  --    Estimated Creatinine Clearance: 13.8 mL/min (A) (by C-G formula based on SCr of 8.42 mg/dL (H)).  Assessment: [name] on warfarin PTA for mech aortic valve. Recent admission 4/15-4/21 forsupratherapeutic INR andruptured hemorrhagic renal cyst. Readmitted 5/3 with abdominal pain, gross hematuria, and supratherapeutic INR. CT abdomen at OSH showed increase in size of perinephric hematoma. Patient s/p embolization on 5/13. Last dose warfarin PTA was on 5/2 and patient is s/p 8 mg total of Vitamin K during hospitalization. INR goal has been decreased to 2-2.5 per MD. Warfarin resumed 5/14.    INR 2.0 today,   Hgb 7.4 (5/25, low/stable), Pltc WNL  Gross blood in stool 5/21 per notes  FOBT + on 5/22  Warfarin held 5/24 (INR 2.5) for probable biopsy (d/w MD), INR 2.3 on 5/26, Warf held again  Concern for calciphylaxis in long-term dialysis patient, general surgery consult requested for biopsy - punch biopsy, unlikely to need lower INR  Last Warfarin dose 5/18  Goal of Therapy:  INR 2-2.5 Monitor platelets by anticoagulation protocol: Yes   Plan:   Warfarin 2.5mg  x1 today at 1800  Daily PT/INR, CBC  Minda Ditto PharmD Clinical Pharmacist Phone [phone] Please utilize Amion for appropriate phone number to reach the unit pharmacist ([place]) 09/09/2018 2:23 AM

## 2018-09-12 NOTE — Progress Notes (Signed)
[facility] PHYSICAL MEDICINE & REHABILITATION PROGRESS NOTE   Subjective/Complaints: Ongoing pain. Feels that he's had more knots develop since yesterday along lower abdomen  ROS: Patient denies fever, rash, sore throat, blurred vision, nausea, vomiting, diarrhea, cough, shortness of breath or chest pain,   headache, or mood change.   Objective:   No results found. Recent Labs    09/11/18 1304 09/12/18 0530  WBC 9.2 7.4  HGB 7.3* 7.5*  HCT 24.6* 24.8*  PLT 353 283   Recent Labs    09/11/18 1304  NA 134*  K 4.3  CL 97*  CO2 24  GLUCOSE 85  BUN 68*  CREATININE 8.42*  CALCIUM 8.3*    Intake/Output Summary (Last 24 hours) at 09/12/2018 0904 Last data filed at 09/11/2018 1743 Gross per 24 hour  Intake 120 ml  Output 2000 ml  Net -1880 ml     Physical Exam: Vital Signs Blood pressure (!) 103/52, pulse (!) 101, temperature 98.3 F (36.8 C), resp. rate 16, weight 121 kg, SpO2 96 %. Constitutional: No distress . Vital signs reviewed. obese HEENT: EOMI, oral membranes moist Neck: supple Cardiovascular: RRR without murmur. No JVD    Respiratory: CTA Bilaterally without wheezes or rales. Normal effort    GI: BS +,tender nodules around abdomen Musculoskeletal:  General: No edema.  Comments: Trace LE edema bilaterally remains Neurological: He is alertand oriented to person, place, and time. Nocranial nerve deficit.Coordinationnormal.  UE 5/5. LE: 3/5 HF, 3+ KE and 4/5 ADF/PF bilaterally. No focal sensory findings--no changes Skin: Skin iswarmand dry.  Multiple 1 to 2 cm subcutaneous soft nodules as above Psychiatric: pleasant    Assessment/Plan: 1. Functional deficits secondary to debility which require 3+ hours per day of interdisciplinary therapy in a comprehensive inpatient rehab setting.  Physiatrist is providing close team supervision and 24 hour management of active medical problems listed below.  Physiatrist and rehab team continue to assess  barriers to discharge/monitor patient progress toward functional and medical goals  Care Tool:  Bathing    Body parts bathed by patient: Right arm, Left arm, Chest, Abdomen, Front perineal area, Face   Body parts bathed by helper: Buttocks, Right upper leg, Left upper leg, Right lower leg, Left lower leg     Bathing assist Assist Level: Moderate Assistance - Patient 50 - 74%     Upper Body Dressing/Undressing Upper body dressing   What is the patient wearing?: Pull over shirt    Upper body assist Assist Level: Contact Guard/Touching assist    Lower Body Dressing/Undressing Lower body dressing      What is the patient wearing?: Pants     Lower body assist Assist for lower body dressing: Maximal Assistance - Patient 25 - 49%     Toileting Toileting Toileting Activity did not occur (Clothing management and hygiene only): N/A (no void or bm)(HD pt)  Toileting assist Assist for toileting: Dependent - Patient 0%     Transfers Chair/bed transfer  Transfers assist  Chair/bed transfer activity did not occur: Refused  Chair/bed transfer assist level: Moderate Assistance - Patient 50 - 74%     Locomotion Ambulation   Ambulation assist   Ambulation activity did not occur: Safety/medical concerns          Walk 10 feet activity   Assist  Walk 10 feet activity did not occur: Safety/medical concerns        Walk 50 feet activity   Assist Walk 50 feet with 2 turns activity did not occur:  Safety/medical concerns         Walk 150 feet activity   Assist Walk 150 feet activity did not occur: Safety/medical concerns         Walk 10 feet on uneven surface  activity   Assist Walk 10 feet on uneven surfaces activity did not occur: Safety/medical concerns         Wheelchair     Assist     Wheelchair activity did not occur: Refused         Wheelchair 50 feet with 2 turns activity    Assist    Wheelchair 50 feet with 2 turns  activity did not occur: Refused       Wheelchair 150 feet activity     Assist Wheelchair 150 feet activity did not occur: Refused        Medical Problem List and Plan: 1.Functional and mobility deficitssecondary to debility after multiple medical issues  -recent hx of bleeding peri-nephric cyst complicated by elevated INR --Continue CIR therapies including PT, OT,  [ -pain limiting progress 2. Antithrombotics: -DVT/anticoagulation:Pharmaceutical:Coumadin INR 2.0 today, adjust per pharm protocol -antiplatelet therapy: N/A 3. Pain Management:  PRN oxycodone-   -add MS Contin 15mg  q12 .  Ice, heat prn to abdomen/flank.  4. Mood:LCSW to follow for evaluation and support. -antipsychotic agents: N/A 5. Neuropsych: This patientiscapable of making decisions on his own behalf. 6. Skin/Wound Care:Routine pressure relief measures.   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 Fluids/Electrolytes/Nutrition:Strict I/O with daily weights. Continue to offer nephro supplements and pro-stat.  -appreciate RD input, spoke with pt earnestly about intake   -he did eat 50% breakfast after our talk  -continue megace trial with some benefit 8. Anemia of chronic disease: Received Feraheme 5/18 and on Aranesp weekly. .  -hgb 7.4 9. CAD s/p AVR/ICM s/p JPE:TKKOECX HR BID--continue amiodarone daily. On coumadin-- 10. Polycystic kidney disease/ESRD:HD scheduled at end of the day to allow full participation in therapies 11. Chronic hypotension: On midodrine tid--SBP runs in 90's. 12. Gross blood in stool.    -HGB trending up, 7.5  -continue to monitor closely     LOS: 8 days A FACE TO [facility] 09/12/2018, 9:04 AM

## 2018-09-12 NOTE — Progress Notes (Signed)
Patient ID: [name] [name], male   DOB: [birth year], 53 y.o.   MRN: [id number] S: complaining of abdominal wall pain at site of subcutaneous nodules and it is interfering with his ability to cooperate with PT. O:BP (!) 103/52 (BP Location: Right Arm)   Pulse (!) 101   Temp 98.3 F (36.8 C)   Resp 16   Wt 121 kg   SpO2 96%   BMI 35.19 kg/m   Intake/Output Summary (Last 24 hours) at 09/12/2018 1107 Last data filed at 09/11/2018 1743 Gross per 24 hour  Intake 120 ml  Output 2000 ml  Net -1880 ml   Intake/Output: I/O last 3 completed shifts: In: 240 [P.O.:240] Out: 2000 [Other:2000]  Intake/Output this shift:  No intake/output data recorded. Weight change: 2.679 kg Gen: NAD CVS: no rub Resp: cta Abd: +BS, soft, some firm, painful subcutaneous nodules in lower abd wall Ext: no edema, LAVF +T/B  Recent Labs  Lab 09/06/18 1307 09/08/18 1317 09/11/18 1304  NA 136 134* 134*  K 3.4* 3.5 4.3  CL 100 95* 97*  CO2 25 25 24   GLUCOSE 95 94 85  BUN 50* 49* 68*  CREATININE 7.07* 6.67* 8.42*  ALBUMIN 1.5* 1.4* 1.4*  CALCIUM 7.8* 8.5* 8.3*  PHOS 4.2 4.0 4.1   Liver Function Tests: Recent Labs  Lab 09/06/18 1307 09/08/18 1317 09/11/18 1304  ALBUMIN 1.5* 1.4* 1.4*   No results for input(s): LIPASE, AMYLASE in the last 168 hours. No results for input(s): AMMONIA in the last 168 hours. CBC: Recent Labs  Lab 09/06/18 1305  09/08/18 0401 09/09/18 0308 09/10/18 0353 09/11/18 1304 09/12/18 0530  WBC 13.4*   < > 9.8 10.3 9.6 9.2 7.4  NEUTROABS 10.8*  --   --   --   --   --   --   HGB 7.6*   < > 7.4* 7.8* 7.4* 7.3* 7.5*  HCT 25.8*   < > 24.9* 26.6* 25.0* 24.6* 24.8*  MCV 87.5   < > 87.4 87.5 87.7 86.3 83.5  PLT 370   < > 316 348 332 353 283   < > = values in this interval not displayed.   Cardiac Enzymes: No results for input(s): CKTOTAL, CKMB, CKMBINDEX, TROPONINI in the last 168 hours. CBG: No results for input(s): GLUCAP in the last 168 hours.  Iron Studies: No results  for input(s): IRON, TIBC, TRANSFERRIN, FERRITIN in the last 72 hours. Studies/Results: No results found. [name] acetaminophen  650 mg Oral Q6H  . amiodarone  200 mg Oral Daily  . atorvastatin  80 mg Oral q1800  . Chlorhexidine Gluconate Cloth  6 each Topical Q0600  . cinacalcet  30 mg Oral Q supper  . darbepoetin (ARANESP) injection - DIALYSIS  100 mcg Intravenous Q Tue-HD  . feeding supplement (NEPRO CARB STEADY)  237 mL Oral BID BM  . feeding supplement (PRO-STAT SUGAR FREE 64)  30 mL Oral TID  . lanthanum  1,000 mg Oral TID PC  . megestrol  400 mg Oral BID  . midodrine  15 mg Oral TID WC  . morphine  15 mg Oral Q12H  . multivitamin  1 tablet Oral QHS  . Warfarin - Pharmacist Dosing Inpatient   Does not apply q1800    BMET    Component Value Date/Time   NA 134 (L) 09/11/2018 1304   NA 141 02/09/2018 1011   NA 137 11/13/2013 0408   K 4.3 09/11/2018 1304   K 4.1 11/13/2013 0408  CL 97 (L) 09/11/2018 1304   CL 98 11/13/2013 0408   CO2 24 09/11/2018 1304   CO2 25 11/13/2013 0408   GLUCOSE 85 09/11/2018 1304   GLUCOSE 64 (L) 11/13/2013 0408   BUN 68 (H) 09/11/2018 1304   BUN 40 (H) 02/09/2018 1011   BUN 68 (H) 11/13/2013 0408   CREATININE 8.42 (H) 09/11/2018 1304   CREATININE 16.45 (H) 11/13/2013 0408   CALCIUM 8.3 (L) 09/11/2018 1304   CALCIUM 6.8 (LL) 11/13/2013 0408   GFRNONAA 7 (L) 09/11/2018 1304   GFRNONAA 3 (L) 11/13/2013 0408   GFRAA 8 (L) 09/11/2018 1304   GFRAA 3 (L) 11/13/2013 0408   CBC    Component Value Date/Time   WBC 7.4 09/12/2018 0530   RBC 2.97 (L) 09/12/2018 0530   HGB 7.5 (L) 09/12/2018 0530   HGB 10.9 (L) 02/09/2018 1011   HCT 24.8 (L) 09/12/2018 0530   HCT 35.8 (L) 02/09/2018 1011   PLT 283 09/12/2018 0530   PLT 260 02/09/2018 1011   MCV 83.5 09/12/2018 0530   MCV 80 02/09/2018 1011   MCV 77 (L) 11/13/2013 0408   MCH 25.3 (L) 09/12/2018 0530   MCHC 30.2 09/12/2018 0530   RDW 18.0 (H) 09/12/2018 0530   RDW 15.9 (H) 02/09/2018 1011   RDW  17.1 (H) 11/13/2013 0408   LYMPHSABS 1.0 09/06/2018 1305   LYMPHSABS 1.0 02/09/2018 1011   LYMPHSABS 0.7 (L) 11/13/2013 0408   MONOABS 0.7 09/06/2018 1305   MONOABS 0.4 11/13/2013 0408   EOSABS 0.7 (H) 09/06/2018 1305   EOSABS 0.7 (H) 02/09/2018 1011   EOSABS 0.6 11/13/2013 0408   BASOSABS 0.1 09/06/2018 1305   BASOSABS 0.1 02/09/2018 1011   BASOSABS 0.1 11/13/2013 0408    Assessment/ Plan:  1. Left-sided perinephric hematoma- in setting of supratherapeutic INR s/p 7 units of PRBC's and s/p transcatheter embolization on 08/29/18. 2. ESRDtolerating HD, cont with TTS schedule 3. 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 1. S/p deep tissue biopsy 09/11/18 and appreciate General Surgery's assistance. 2. Continue with pain management and aggressive phos control 4. Anemia:ACD superimprosed with ABLA- on ESA and transfuse prn 5. 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 Severe protein malnutrition- recommend supplements 7. Nutrition:renal diet 8. Hypertension:stable/chronic hypotension on midodrine 9. AVR- on coumadin 10. Atrial fibrillation- on amio and anticoagulation   Donetta Potts, MD [facility] [phone]

## 2018-09-12 NOTE — Patient Care Conference (Signed)
Inpatient RehabilitationTeam Conference and Plan of Care Update Date: 09/12/2018   Time: 1:55 PM    Patient Name: [name] [name]      Medical Record Number: [id number]  Date of Birth: [birth year] Sex: Male         Room/Bed: 4W19C/4W19C-01 Payor Info: Payor: MEDICARE / Plan: MEDICARE PART A AND B / Product Type: *No Product type* /    Admitting Diagnosis: Overflow Team  Debility, malaise; 16-19days  Admit Date/Time:  09/04/2018  3:50 PM Admission Comments: No comment available   Primary Diagnosis:  <principal problem not specified> Principal Problem: <principal problem not specified>  Patient Active Problem List   Diagnosis Date Noted  . Debility 09/04/2018  . Perinephric hematoma 08/20/2018  . Supratherapeutic INR 08/20/2018  . Gross hematuria 08/19/2018  . Acute blood loss anemia 08/02/2018  . Renal hematoma, left, initial encounter 08/01/2018  . 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 ICD (implantable cardioverter-defibrillator) pocket hematoma 01/01/2018  . Encounter for therapeutic drug monitoring 09/14/2017  . S/P aortic valve replacement with bileaflet mechanical valve 09/01/2017  . S/P CABG x 1 09/01/2017  . Coronary artery disease involving native coronary artery of native heart without angina pectoris   . 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 Abnormal echocardiogram   . Mitral regurgitation   . Severe aortic insufficiency   . Acute on chronic combined systolic and diastolic heart failure ([place])   . Systolic CHF ([place]) 37/16/9678  . 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 Dermatitis 04/06/2016  . End stage renal disease ([place]) 12/22/2011  . Sinus tachycardia   . ESRD (end stage renal disease) ([place])   . Orthostatic hypotension   . Chest discomfort   . Morbid obesity ([place])   . Syncope   . Aneurysm [facility])     Expected Discharge Date: Expected Discharge Date: (2 weeks)  Team Members Present: Physician leading  conference: Dr. Alger Simons Social Worker Present: Lennart Pall, LCSW Nurse Present: Rayetta Pigg, RN PT Present: Other (comment)(Carly Pippin, PT) OT Present: Roanna Epley, [place], OT SLP Present: Weston Anna, SLP PPS Coordinator present : Ileana Ladd, PT     Current Status/Progress Goal Weekly Team Focus  Medical   debility after renal cyst rupture, renal failure, calciphylaxis, poor po intake  reduce pain, increase activity tolerance  pain control, renal mgt, calciphylaxis mgt   Bowel/Bladder   Incontinent of bowel and bladder LBM 09/08/2018  fewer episodes of incontinence  Assess bowel and bladder needs qhsift and PRN   Swallow/Nutrition/ Hydration             ADL's   UB bathing/dressing-supervision/CGA; LB bathing/dressing bed level-max/mod A; slide board tranfsers-min A; ongoing pain inhibiting active participation in therapy  supervision overall  OOB tolerance, activity tolerance, BADL training OOB, sitting balance, sit<>stand, discharge planning   Mobility   min assist bed mobility, max assist +2 slideboard transfer, ongoing pain limiting participation with OOB mobility during therapy  supervision bed mobility and transfers, min assist ambulation 15ft and stairs  activity tolerance, BLE strengthening, bed mobility, patient educaiton on pressure relief and importance of therapy   Communication             Safety/Cognition/ Behavioral Observations            Pain   C/o pain to ABD controlled with Oxycodone 10mg  Q3 PRN, tramadol 50 q6 PRN. Pt refuses scheduled tylenol  pain less than 3  assess pain management qshift and PRN   Skin  Stage 2 to bottom, blood blister to left flank foam dressing in place, and abrasion to chest open to air.   Promote proper wound healing,   Assess skin qshift and PRN    Rehab Goals Patient on target to meet rehab goals: Yes *See Care Plan and progress notes for long and short-term goals.     Barriers to Discharge  Current  Status/Progress Possible Resolutions Date Resolved   Physician    Medical stability;Wound Care;Weight        continued pain mgt, education      Nursing                  PT                    OT                  SLP                SW                Discharge Planning/Teaching Needs:  home with wife and adult daughter who can provide 24/7 assistance.  Teaching needs to be determined closer to d/c.   Team Discussion:  deconditioned due to bleeding cyst and multiple medical issues.  Nodules on skin causing pain.  Review of lines that can be removed per MD.  Poor po intate and appetite;  Stage 2 bottom.  Poor OOB tolerance;  Max assist - min assist  Overall.  Limited OOB mobility.  Supervision goals and min assist gait.  Revisions to Treatment Plan:  NA    Continued Need for Acute Rehabilitation Level of Care: The patient requires daily medical management by a physician with specialized training in physical medicine and rehabilitation for the following conditions: Daily direction of a multidisciplinary physical rehabilitation program to ensure safe treatment while eliciting the highest outcome that is of practical value to the patient.: Yes Daily medical management of patient stability for increased activity during participation in an intensive rehabilitation regime.: Yes Daily analysis of laboratory values and/or radiology reports with any subsequent need for medication adjustment of medical intervention for : Post surgical problems;Wound care problems;Neurological problems;Renal problems   I attest that I was present, lead the team conference, and concur with the assessment and plan of the team.   Lennart Pall 09/12/2018, 10:57 AM    Team conference was held via web/ teleconference due to [place] - 19

## 2018-09-12 NOTE — Progress Notes (Signed)
Physical Therapy Weekly Progress Note  Patient Details  Name: CURLEY HOGEN MRN: [id number] Date of Birth: Sep 15, 1965  Beginning of progress report period:  End of progress report period:09/12/18  Today's Date: 09/12/2018 PT Individual Time: 0800-0900 and 1300-1400 PT Individual Time Calculation (min): 60 min and 60 min  Patient has met 1 of 4 short term goals.    Patient continues to demonstrate the following deficits muscle weakness and muscle joint tightness, decreased cardiorespiratoy endurance and decreased standing balance, decreased postural control and decreased balance strategies and therefore will continue to benefit from skilled PT intervention to increase functional independence with mobility.  Pt is limited by pain and hypotension.   He had a biopsy of L lower abdomen yesterday; pathology pending.  Patient not progressing toward long term goals.  After biopsy results are in, plan will be modified PRN.  Pt reports weight loss x several months PTA, and feels he may have CA.    Continue plan of care.  PT Short Term Goals Week 1:  PT Short Term Goal 1 (Week 1): Pt will initiate OOB mobility PT Short Term Goal 1 - Progress (Week 1): Met PT Short Term Goal 2 (Week 1): Pt will initiate gait training PT Short Term Goal 2 - Progress (Week 1): Not met PT Short Term Goal 3 (Week 1): Pt will perform sit<>stand w/ max assist x1 PT Short Term Goal 3 - Progress (Week 1): Not met PT Short Term Goal 4 (Week 1): Pt will tolerate 30 min of upright activity w/o increase in fatigue PT Short Term Goal 4 - Progress (Week 1): Not met Week 2:  PT Short Term Goal 1 (Week 2): pt will tolerate semi upright position x 2 hours in w/c or recliner 5/7 days  PT Short Term Goal 2 (Week 2): pt will transfer with assist of 1, 50 % of trials PT Short Term Goal 3 (Week 2): pt wil move sit>< stand with +2 and tolerate x 30 seconds  Skilled Therapeutic Interventions/Progress Updates:   tx 1:  Pt resting in  bed; he had a punch biopsy to L abdomen yesterday and that area is especially sore.  Pt willing to start with bed level strengthening: 15 x 1 bil hip adductor squeezes, R/L shoulder punches with extended elbow, 10 x 1 R/L straight leg raises, cervical flexion.  R/partial L side lying for 10 x 2 L/R hip abduction with flexed knees and hips.  BP in supine 87/53; HR 102 .  PT urged pt to drink water. He drank about 2 oz and stated that he could not drink any more.  Pt drank about 1 oz of Sprite.  Pt's affect brightened when talking about his grandson Alroy Dust, who is 9.  Continued education regarding nutrition for energy level, healing of DQs.  PT discussed pt's goals with him, and he was willing to attempt standing with RW.  +2 to partial standing from raised bed; pt unable to extend knees, sitting after <10 seconds.  2nd attempt terminated as pt was sliding forward unsafely.  +2 and use of bed features to re-position pt.  Pt stated that he was a " little dizzy" on the 2nd attempt only.  PT educated pt on bed positioning to stay off of back for long periods of time.  TCs for rolling R using bed features.   Pt left resting in bed, partially on L side, with needs at hand and alarm set.   tx 2:  Pt resting in bed.  He declined attempting to get OOB due to pain, but was willing to participate at bed level.  Therapeutic exercise performed in supine with LE to increase strength for functional mobility: 10 x 2 each- R/L shoulder punches as above, bil pelvic tilts/bridging, lower trunk rotation, cervical flexion, bil hip adductor squeezes, R/L long arc quad knee extensions; 25 x 1 bil ankle pumps.  At end of session, pt resting in supine with alarm set and needs at hand.       Therapy Documentation Precautions:  Precautions Precautions: Fall Precaution Comments: pain along flanks and abdomen; ice as needed Restrictions Weight Bearing Restrictions: No  Pain:  tx 1: "off the chart", abdomen;  received pain meds at start of session and using K pad tx 2:  10/10, abdomen; premedicated and using K pad; Kayla, LPN dispensed meds during session    Therapy/Group: Individual Therapy  Marylu Dudenhoeffer 09/12/2018, 10:45 AM

## 2018-09-13 ENCOUNTER — Inpatient Hospital Stay (HOSPITAL_COMMUNITY): Payer: Medicare Other | Admitting: *Deleted

## 2018-09-13 ENCOUNTER — Inpatient Hospital Stay (HOSPITAL_COMMUNITY): Payer: Medicare Other

## 2018-09-13 ENCOUNTER — Inpatient Hospital Stay (HOSPITAL_COMMUNITY): Payer: Medicare Other | Admitting: Physical Therapy

## 2018-09-13 LAB — RENAL FUNCTION PANEL
Albumin: 1.4 g/dL — ABNORMAL LOW (ref 3.5–5.0)
Anion gap: 16 — ABNORMAL HIGH (ref 5–15)
BUN: 54 mg/dL — ABNORMAL HIGH (ref 6–20)
CO2: 24 mmol/L (ref 22–32)
Calcium: 8.5 mg/dL — ABNORMAL LOW (ref 8.9–10.3)
Chloride: 94 mmol/L — ABNORMAL LOW (ref 98–111)
Creatinine, Ser: 7.53 mg/dL — ABNORMAL HIGH (ref 0.61–1.24)
GFR calc Af Amer: 9 mL/min — ABNORMAL LOW (ref >60)
GFR calc non Af Amer: 7 mL/min — ABNORMAL LOW (ref >60)
Glucose, Bld: 93 mg/dL (ref 70–99)
Phosphorus: 4.4 mg/dL (ref 2.5–4.6)
Potassium: 4.2 mmol/L (ref 3.5–5.1)
Sodium: 134 mmol/L — ABNORMAL LOW (ref 135–145)

## 2018-09-13 LAB — CBC
HCT: 26.2 % — ABNORMAL LOW (ref 39.0–52.0)
Hemoglobin: 8 g/dL — ABNORMAL LOW (ref 13.0–17.0)
MCH: 25.4 pg — ABNORMAL LOW (ref 26.0–34.0)
MCHC: 30.5 g/dL (ref 30.0–36.0)
MCV: 83.2 fL (ref 80.0–100.0)
Platelets: 354 10*3/uL (ref 150–400)
RBC: 3.15 MIL/uL — ABNORMAL LOW (ref 4.22–5.81)
RDW: 18.5 % — ABNORMAL HIGH (ref 11.5–15.5)
WBC: 10 10*3/uL (ref 4.0–10.5)
nRBC: 0 % (ref 0.0–0.2)

## 2018-09-13 LAB — PROTIME-INR
INR: 2.2 — ABNORMAL HIGH (ref 0.8–1.2)
Prothrombin Time: 23.7 seconds — ABNORMAL HIGH (ref 11.4–15.2)

## 2018-09-13 LAB — OCCULT BLOOD X 1 CARD TO LAB, STOOL: Fecal Occult Bld: NEGATIVE

## 2018-09-13 MED ORDER — WARFARIN SODIUM 2.5 MG PO TABS
2.5000 mg | ORAL_TABLET | Freq: Once | ORAL | Status: DC
  Filled 2018-09-13: qty 1

## 2018-09-13 MED ORDER — WARFARIN SODIUM 2 MG PO TABS
2.0000 mg | ORAL_TABLET | Freq: Once | ORAL | Status: AC
  Administered 2018-09-13: 2 mg via ORAL
  Filled 2018-09-13 (×2): qty 1

## 2018-09-13 NOTE — Progress Notes (Signed)
[facility] for warfarin Indication: Mechanical valve  No Known Allergies  Patient Measurements: Weight: 273 lb 5.9 oz (124 kg)  Vital Signs: Temp: 98.3 F (36.8 C) (05/28 1325) Temp Source: Oral (05/28 1325) BP: 115/41 (05/28 1345) Pulse Rate: 90 (05/28 1345)  Labs: Recent Labs    09/11/18 0330  09/11/18 1304 09/12/18 0530 09/13/18 0604  HGB  --    < > 7.3* 7.5* 8.0*  HCT  --   --  24.6* 24.8* 26.2*  PLT  --   --  353 283 354  LABPROT 22.7*  --   --  22.3* 23.7*  INR 2.0*  --   --  2.0* 2.2*  CREATININE  --   --  8.42*  --   --    < > = values in this interval not displayed.   Estimated Creatinine Clearance: 14 mL/min (A) (by C-G formula based on SCr of 8.42 mg/dL (H)).  Assessment: 25 YOM on warfarin PTA for mech aortic valve. Recent admission 4/15-4/21 forsupratherapeutic INR andruptured hemorrhagic renal cyst. Readmitted 5/3 with abdominal pain, gross hematuria, and supratherapeutic INR. CT abdomen at OSH showed increase in size of perinephric hematoma. Patient s/p embolization on 5/13. Last dose warfarin PTA was on 5/2 and patient is s/p 8 mg total of Vitamin K during hospitalization. INR goal has been decreased to 2-2.5 per MD. Warfarin resumed 5/14.  Continues on amiodarone, on PTA    INR 2.2 today, no bleeding reported today.  Hgb 8.0, low/stable trending up, Pltc WNL  Gross blood in stool 5/21 per notes  FOBT + on 5/22  Warfarin held 5/24 (INR 2.5) for biopsy (d/w MD), Warfarin resumed 5/26  Concern for calciphylaxis in long-term dialysis patient, general surgery consult requested for biopsy - punch biopsy, unlikely to need lower INR  Goal of Therapy:  INR 2-2.5 Monitor platelets by anticoagulation protocol: Yes   Plan:   Warfarin 2.5mg  x1 today at 1800  Daily PT/INR, CBC  Nicole Cella, RPh Clinical Pharmacist Phone [phone] Please utilize Amion for appropriate phone number to reach the unit pharmacist ([place]) 09/09/2018 2:23 AM

## 2018-09-13 NOTE — Progress Notes (Signed)
[facility] PHYSICAL MEDICINE & REHABILITATION PROGRESS NOTE   Subjective/Complaints: Abdominal pain a little better this morning. Still doesn't really want to get up d/t pain. Ate more yesterday, no breakfast this am though  ROS: Patient denies fever, rash, sore throat, blurred vision, nausea, vomiting, diarrhea, cough, shortness of breath or chest pain, joint or back pain, headache, or mood change.    Objective:   No results found. Recent Labs    09/12/18 0530 09/13/18 0604  WBC 7.4 10.0  HGB 7.5* 8.0*  HCT 24.8* 26.2*  PLT 283 354   Recent Labs    09/11/18 1304  NA 134*  K 4.3  CL 97*  CO2 24  GLUCOSE 85  BUN 68*  CREATININE 8.42*  CALCIUM 8.3*    Intake/Output Summary (Last 24 hours) at 09/13/2018 1222 Last data filed at 09/13/2018 0746 Gross per 24 hour  Intake 240 ml  Output 1 ml  Net 239 ml     Physical Exam: Vital Signs Blood pressure 108/69, pulse 90, temperature 97.9 F (36.6 C), temperature source Oral, resp. rate 16, weight 124 kg, SpO2 99 %. Constitutional: No distress . Vital signs reviewed. HEENT: EOMI, oral membranes moist Neck: supple Cardiovascular: RRR without murmur. No JVD    Respiratory: CTA Bilaterally without wheezes or rales. Normal effort    GI: BS +, non-tender, non-distended  Musculoskeletal:  General: No edema.  Comments: Trace LE edema  Neurological: He is alertand oriented to person, place, and time. Nocranial nerve deficit.Coordinationnormal.  UE 5/5. LE: 3/5 HF, 3+ KE and 4/5 ADF/PF bilaterally. No focal sensory findings--no changes Skin: Skin iswarmand dry.  Multiple 1 to 2 cm subcutaneous soft nodules along abdomen. Psychiatric: pleasant    Assessment/Plan: 1. Functional deficits secondary to debility which require 3+ hours per day of interdisciplinary therapy in a comprehensive inpatient rehab setting.  Physiatrist is providing close team supervision and 24 hour management of active medical problems  listed below.  Physiatrist and rehab team continue to assess barriers to discharge/monitor patient progress toward functional and medical goals  Care Tool:  Bathing    Body parts bathed by patient: Right arm, Left arm, Chest, Abdomen, Front perineal area, Face   Body parts bathed by helper: Buttocks, Right upper leg, Left upper leg, Right lower leg, Left lower leg     Bathing assist Assist Level: Moderate Assistance - Patient 50 - 74%     Upper Body Dressing/Undressing Upper body dressing   What is the patient wearing?: Pull over shirt    Upper body assist Assist Level: Contact Guard/Touching assist    Lower Body Dressing/Undressing Lower body dressing      What is the patient wearing?: Pants     Lower body assist Assist for lower body dressing: Maximal Assistance - Patient 25 - 49%     Toileting Toileting Toileting Activity did not occur (Clothing management and hygiene only): N/A (no void or bm)(HD pt)  Toileting assist Assist for toileting: Dependent - Patient 0%     Transfers Chair/bed transfer  Transfers assist  Chair/bed transfer activity did not occur: Refused  Chair/bed transfer assist level: 2 Helpers     Locomotion Ambulation   Ambulation assist   Ambulation activity did not occur: Safety/medical concerns          Walk 10 feet activity   Assist  Walk 10 feet activity did not occur: Safety/medical concerns        Walk 50 feet activity   Assist Walk 50 feet  with 2 turns activity did not occur: Safety/medical concerns         Walk 150 feet activity   Assist Walk 150 feet activity did not occur: Safety/medical concerns         Walk 10 feet on uneven surface  activity   Assist Walk 10 feet on uneven surfaces activity did not occur: Safety/medical concerns         Wheelchair     Assist     Wheelchair activity did not occur: Refused         Wheelchair 50 feet with 2 turns activity    Assist     Wheelchair 50 feet with 2 turns activity did not occur: Refused       Wheelchair 150 feet activity     Assist Wheelchair 150 feet activity did not occur: Refused        Medical Problem List and Plan: 1.Functional and mobility deficitssecondary to debility after multiple medical issues  -recent hx of bleeding peri-nephric cyst complicated by elevated INR --Continue CIR therapies including PT, OT,  [ -pain still limiting. Sometimes patient is self-limiting. Discussed with him the fact that he will need to demonstrate more progress if he wants to return home from inpatient rehab. May need to be thinking SNF. 2. Antithrombotics: -DVT/anticoagulation:Pharmaceutical:Coumadin INR 2.0 today, adjust per pharm protocol -antiplatelet therapy: N/A 3. Pain Management:  PRN oxycodone-   -continue MS Contin 15mg  q12, consider increase tomorrow .  Ice, heat prn to abdomen/flank.  4. Mood:LCSW to follow for evaluation and support. -antipsychotic agents: N/A 5. Neuropsych: This patientiscapable of making decisions on his own behalf. 6. Skin/Wound Care:Routine pressure relief measures.   -Abdominal nodules suspicious for calciphylaxis  -nephrology has added sensipar and sodium thiosulfate with HD  -surgical bx revealed normal tissue however.  7. Fluids/Electrolytes/Nutrition:Strict I/O with daily weights. Continue to offer nephro supplements and pro-stat.  -appreciate RD input  -seems to be eating a bit more  -continue megace trial with some benefit 8. Anemia of chronic disease: Received Feraheme 5/18 and on Aranesp weekly. .  -hgb 7.4 9. CAD s/p AVR/ICM s/p PNP:YYFRTMY HR BID--continue amiodarone daily. On coumadin-- 10. Polycystic kidney disease/ESRD:HD scheduled at end of the day to allow full participation in therapies 11. Chronic hypotension: On midodrine tid--SBP runs in 90's. 12. Gross blood in stool.    -HGB trending up, 8.0  today  -continue to monitor closely     LOS: 9 days A FACE TO [place] 09/13/2018, 12:22 PM

## 2018-09-13 NOTE — Progress Notes (Signed)
Patient ID: NICOLAUS ANDEL, male   DOB: 1965-06-21, 53 y.o.   MRN: [id number] S: Feels better but still with abdominal wall pain O:BP 108/69 (BP Location: Right Arm)   Pulse 90   Temp 97.9 F (36.6 C) (Oral)   Resp 16   Wt 124 kg   SpO2 99%   BMI 36.07 kg/m   Intake/Output Summary (Last 24 hours) at 09/13/2018 1244 Last data filed at 09/13/2018 0746 Gross per 24 hour  Intake 240 ml  Output 1 ml  Net 239 ml   Intake/Output: I/O last 3 completed shifts: In: 360 [P.O.:360] Out: 1 [Stool:1]  Intake/Output this shift:  No intake/output data recorded. Weight change: 1 kg Gen: NAD CVS: no rub Resp: cta Abd: +subcutaneous nodules, tender to palpation Ext: no edema, LAVF +T/B  Recent Labs  Lab 09/06/18 1307 09/08/18 1317 09/11/18 1304  NA 136 134* 134*  K 3.4* 3.5 4.3  CL 100 95* 97*  CO2 25 25 24   GLUCOSE 95 94 85  BUN 50* 49* 68*  CREATININE 7.07* 6.67* 8.42*  ALBUMIN 1.5* 1.4* 1.4*  CALCIUM 7.8* 8.5* 8.3*  PHOS 4.2 4.0 4.1   Liver Function Tests: Recent Labs  Lab 09/06/18 1307 09/08/18 1317 09/11/18 1304  ALBUMIN 1.5* 1.4* 1.4*   No results for input(s): LIPASE, AMYLASE in the last 168 hours. No results for input(s): AMMONIA in the last 168 hours. CBC: Recent Labs  Lab 09/06/18 1305  09/09/18 0308 09/10/18 0353 09/11/18 1304 09/12/18 0530 09/13/18 0604  WBC 13.4*   < > 10.3 9.6 9.2 7.4 10.0  NEUTROABS 10.8*  --   --   --   --   --   --   HGB 7.6*   < > 7.8* 7.4* 7.3* 7.5* 8.0*  HCT 25.8*   < > 26.6* 25.0* 24.6* 24.8* 26.2*  MCV 87.5   < > 87.5 87.7 86.3 83.5 83.2  PLT 370   < > 348 332 353 283 354   < > = values in this interval not displayed.   Cardiac Enzymes: No results for input(s): CKTOTAL, CKMB, CKMBINDEX, TROPONINI in the last 168 hours. CBG: No results for input(s): GLUCAP in the last 168 hours.  Iron Studies: No results for input(s): IRON, TIBC, TRANSFERRIN, FERRITIN in the last 72 hours. Studies/Results: No results found. [name]  acetaminophen  650 mg Oral Q6H  . amiodarone  200 mg Oral Daily  . atorvastatin  80 mg Oral q1800  . Chlorhexidine Gluconate Cloth  6 each Topical Q0600  . cinacalcet  30 mg Oral Q supper  . darbepoetin (ARANESP) injection - DIALYSIS  100 mcg Intravenous Q Tue-HD  . feeding supplement (NEPRO CARB STEADY)  237 mL Oral BID BM  . feeding supplement (PRO-STAT SUGAR FREE 64)  30 mL Oral TID  . lanthanum  1,000 mg Oral TID PC  . megestrol  400 mg Oral BID  . midodrine  15 mg Oral TID WC  . morphine  15 mg Oral Q12H  . multivitamin  1 tablet Oral QHS  . Warfarin - Pharmacist Dosing Inpatient   Does not apply q1800    BMET    Component Value Date/Time   NA 134 (L) 09/11/2018 1304   NA 141 02/09/2018 1011   NA 137 11/13/2013 0408   K 4.3 09/11/2018 1304   K 4.1 11/13/2013 0408   CL 97 (L) 09/11/2018 1304   CL 98 11/13/2013 0408   CO2 24 09/11/2018 1304   CO2  25 11/13/2013 0408   GLUCOSE 85 09/11/2018 1304   GLUCOSE 64 (L) 11/13/2013 0408   BUN 68 (H) 09/11/2018 1304   BUN 40 (H) 02/09/2018 1011   BUN 68 (H) 11/13/2013 0408   CREATININE 8.42 (H) 09/11/2018 1304   CREATININE 16.45 (H) 11/13/2013 0408   CALCIUM 8.3 (L) 09/11/2018 1304   CALCIUM 6.8 (LL) 11/13/2013 0408   GFRNONAA 7 (L) 09/11/2018 1304   GFRNONAA 3 (L) 11/13/2013 0408   GFRAA 8 (L) 09/11/2018 1304   GFRAA 3 (L) 11/13/2013 0408   CBC    Component Value Date/Time   WBC 10.0 09/13/2018 0604   RBC 3.15 (L) 09/13/2018 0604   HGB 8.0 (L) 09/13/2018 0604   HGB 10.9 (L) 02/09/2018 1011   HCT 26.2 (L) 09/13/2018 0604   HCT 35.8 (L) 02/09/2018 1011   PLT 354 09/13/2018 0604   PLT 260 02/09/2018 1011   MCV 83.2 09/13/2018 0604   MCV 80 02/09/2018 1011   MCV 77 (L) 11/13/2013 0408   MCH 25.4 (L) 09/13/2018 0604   MCHC 30.5 09/13/2018 0604   RDW 18.5 (H) 09/13/2018 0604   RDW 15.9 (H) 02/09/2018 1011   RDW 17.1 (H) 11/13/2013 0408   LYMPHSABS 1.0 09/06/2018 1305   LYMPHSABS 1.0 02/09/2018 1011   LYMPHSABS 0.7 (L)  11/13/2013 0408   MONOABS 0.7 09/06/2018 1305   MONOABS 0.4 11/13/2013 0408   EOSABS 0.7 (H) 09/06/2018 1305   EOSABS 0.7 (H) 02/09/2018 1011   EOSABS 0.6 11/13/2013 0408   BASOSABS 0.1 09/06/2018 1305   BASOSABS 0.1 02/09/2018 1011   BASOSABS 0.1 11/13/2013 0408     Assessment/ Plan:  1. Left-sided perinephric hematoma- in setting of supratherapeutic INR s/p 7 units of PRBC's and s/p transcatheter embolization on 08/29/18. 2. ESRDtolerating HD, cont with TTS schedule 3. 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 1. S/pdeep tissue biopsy5/26/20 and appreciate General Surgery's assistance, however bx without calciphylaxis. 2. Still suspicious and will continue with current treatment and consider repeat biopsy to another site. 3. Continue with pain management and aggressive phos control 4. Anemia:ACD superimprosed with ABLA- on ESA and transfuse prn 5. 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 Severe protein malnutrition- recommend supplements 7. Nutrition:renal diet 8. Hypertension:stable/chronic hypotension on midodrine 9. AVR- on coumadin 10. Atrial fibrillation- on amio and anticoagulation    Donetta Potts, MD [facility] [phone]

## 2018-09-13 NOTE — Progress Notes (Signed)
Physical Therapy Session Note  Patient Details  Name: [name] [name] MRN: [id number] Date of Birth: 01/27/1966  Today's Date: 09/13/2018 PT Individual Time: 0932-1027 PT Individual Time Calculation (min): 55 min   Short Term Goals: Week 1:  PT Short Term Goal 1 (Week 1): Pt will initiate OOB mobility PT Short Term Goal 1 - Progress (Week 1): Met PT Short Term Goal 2 (Week 1): Pt will initiate gait training PT Short Term Goal 2 - Progress (Week 1): Not met PT Short Term Goal 3 (Week 1): Pt will perform sit<>stand w/ max assist x1 PT Short Term Goal 3 - Progress (Week 1): Not met PT Short Term Goal 4 (Week 1): Pt will tolerate 30 min of upright activity w/o increase in fatigue PT Short Term Goal 4 - Progress (Week 1): Not met  Skilled Therapeutic Interventions/Progress Updates:      Therapy Documentation Precautions:  Precautions Precautions: Fall Precaution Comments: pain along flanks and abdomen; ice as needed Restrictions Weight Bearing Restrictions: No  Treatment: Pt seated in recliner on arrival. Agreeable to PT. Wanting to get back to bed as his bottom/abdomen are hurting. Rating his pain as 10/10. RN aware and pt has been premedicated.   Seated without back support (had pt sit forward in chair)- had pt work on shoulder horizontal abduction with level 3 band for 2 sets of 10 reps. Rest break needed between each set. With back support had pt perform HS curls with level 3 band resistance for 2 sets of 10 reps each side. With second person assist attempted to stand from recliner to RW with no success (not able to clear bottom off chair surface) with max assist of 2 people. Slide board transfer for chair to bed with slight elevation up even with bed at lowest point. Mod assist to initiate downgrading to min assist to complete transfer with 2cd person for safety. Cures on weight shifting, head/hip relationship and use of arms to assist with movements. Once edge of bed pt was adamant he  was too tired to attempt to stand again despite maximal encouragement. Max assist for sitting to supine with most assistance needed with lifting/clearing bed surface with bil LE's. Once in supine worked on the following exercises with bil LE's: quad sets with 5 sec holds AROM, then heels slides, straight leg raises, short arc quads, and marching in bed with no resistance/weights used for 2 sets of 10 reps each. Then with level 3 band pt performed resisted DF, PF and hip abduction/adduciton for 2 sets of 10 reps each.   Pt left with HOB 30 degrees and all needs in reach. No increase in pain reported with session.   Therapy/Group: Individual Therapy  Lindon Romp, PTA, CLT 09/13/18, 8:41 PM

## 2018-09-13 NOTE — Progress Notes (Signed)
[place] for warfarin Indication: Mechanical valve  No Known Allergies  Patient Measurements: Weight: 273 lb 5.9 oz (124 kg)  Vital Signs: Temp: 97.4 F (36.3 C) (05/28 0535) Temp Source: Oral (05/28 0535) BP: 111/64 (05/28 0535) Pulse Rate: 91 (05/28 0535)  Labs: Recent Labs    09/11/18 0330  09/11/18 1304 09/12/18 0530 09/13/18 0604  HGB  --    < > 7.3* 7.5* 8.0*  HCT  --   --  24.6* 24.8* 26.2*  PLT  --   --  353 283 354  LABPROT 22.7*  --   --  22.3* 23.7*  INR 2.0*  --   --  2.0* 2.2*  CREATININE  --   --  8.42*  --   --    < > = values in this interval not displayed.   Estimated Creatinine Clearance: 14 mL/min (A) (by C-G formula based on SCr of 8.42 mg/dL (H)).  Assessment: 32 YOM on warfarin PTA for mech aortic valve. Recent admission 4/15-4/21 forsupratherapeutic INR andruptured hemorrhagic renal cyst. Readmitted 5/3 with abdominal pain, gross hematuria, and supratherapeutic INR. CT abdomen at OSH showed increase in size of perinephric hematoma. Patient s/p embolization on 5/13. Last dose warfarin PTA was on 5/2 and patient is s/p 8 mg total of Vitamin K during hospitalization. INR goal has been decreased to 2-2.5 per MD. Warfarin resumed 5/14.    INR 2.2 today,   Hgb 8.0 low-stable, Pltc WNL  Gross blood in stool 5/21 per notes  FOBT + on 5/22  Warfarin held 5/24 (INR 2.5) for probable biopsy (d/w MD), INR 2.3 on 5/26, Warf held again  Concern for calciphylaxis in long-term dialysis patient, general surgery consult requested for biopsy - punch biopsy completed 5/26 , unlikely to need lower INR  Po intake varies from day to day, confounding INR Goal of Therapy:  INR 2-2.5 Monitor platelets by anticoagulation protocol: Yes   Plan:   Warfarin 2mg  x1 today at 1800  Daily PT/INR, CBC  Minda Ditto PharmD Clinical Pharmacist Phone [phone] Please utilize Amion for appropriate phone number to reach the unit  pharmacist ([place]) 09/09/2018 2:23 AM

## 2018-09-13 NOTE — Progress Notes (Addendum)
Physical Therapy Session Note  Patient Details  Name: [name] [name] MRN: [id number] Date of Birth: 1966-01-28  Today's Date: 09/13/2018 PT Individual Time:0800  -  0900, 60 min  LTGs downgraded due to lack of progress, poor nutrition, pain.      Short Term Goals:  Week 2:  PT Short Term Goal 1 (Week 2): pt will tolerate semi upright position x 2 hours in w/c or recliner 5/7 days  PT Short Term Goal 2 (Week 2): pt will transfer with assist of 1, 50 % of trials PT Short Term Goal 3 (Week 2): pt wil move sit>< stand with +2 and tolerate x 30 seconds  Skilled Therapeutic Interventions/Progress Updates:   Pt resting in bed.  Pt had not eaten any breakfast.  He does not usually eat breakfast.  Pain rated 10/10 abdomen; premedicated and using K pad.  Dr. Ephriam Knuckles in the room, discussing possible need for supplemental feedings, working through pain, etc.  PT later spoke with Dr. Ephriam Knuckles who stated that he does not feel pt has CA.  PT donned bil thigh- high TEDS.  PT threaded shorts over feet and legs and pt pulled them up, in supine.  PT DOE after pulling up shorts.  Supine> sit with mod assist for trunk.  PT placed board.  Slide board transfer bed> recliner slightly downhill, +2 initially, then mod assist of 1.  Seated ball activities: bounce/pass in upright sitting, x 20; chest passes x 7 ; in reclined sitting, chest passes x 15.  Seated Therapeutic exercise performed with LE to increase strength for functional mobility: 2 x 10 bil heel/toe raises, R/L long arc quad knee extensions with ankle pumps at end range, 2 x 5 R/L hip flexion with 3# ankle weight; 2 x 15 bil adductor squeezes.  Self stretching in sitting with use of footstool, hamstrings , x 30 seconds x 3 R/L.  Pt left resting in recliner without feet elevated; ok'd by pt who understands that this is to improve sitting tolerance.  Needs were left at hand and seat alarm set.  PT requested that next therapist attempt to place w/c cushion  under pt in recliner.       Therapy Documentation Precautions:  Precautions Precautions: Fall Precaution Comments: pain along flanks and abdomen; K pad as needed Restrictions Weight Bearing Restrictions: No           Therapy/Group: Individual Therapy  Geraldean Walen 09/13/2018, 1:01 PM

## 2018-09-13 NOTE — Progress Notes (Signed)
Occupational Therapy Session Note  Patient Details  Name: [name] [name] MRN: [id number] Date of Birth: 1965-07-06  Today's Date: 09/13/2018 OT Individual Time: 1448-1856 OT Individual Time Calculation (min): 55 min    Short Term Goals: Week 2:  OT Short Term Goal 1 (Week 2): Pt will tolerate sitting upright out of bed for 3 hrs a day. OT Short Term Goal 2 (Week 2): Pt will perform sit to stand with mod A +1 for LB dressing  OT Short Term Goal 3 (Week 2): Pt will perform lateral transfers (scoot or squat) with min A  OT Short Term Goal 4 (Week 2): Pt will LB dressing with mod A (sit to stand).  Skilled Therapeutic Interventions/Progress Updates:    Pt resting in bed upon arrival.  Pt was OOB earlier in day but returned 2/2 increased abdominal pain and bttocks pain.  OT intervention with focus on bed mobility and BUE therex with green theraband, 2kg ball, and 2# bar with beach ball.  Pt requires min A for rolling R<>L and repositoining in bed.  Pt able to demonstrate exercises with theraband and 2kg ball.  Pt fatigues easily and requires rest breaks between exercises. Pt remained in bed with all needs within reach.   Therapy Documentation Precautions:  Precautions Precautions: Fall Precaution Comments: pain along flanks and abdomen; ice as needed Restrictions Weight Bearing Restrictions: No Pain: Pain Assessment Pain Scale: 0-10 Pain Score: 8  Pain Type: Acute pain Pain Location: Back Pain Orientation: Lower;Lateral Pain Descriptors / Indicators: Stabbing Pain Frequency: Intermittent Pain Onset: On-going Pain Intervention(s): Repositioned   Therapy/Group: Individual Therapy  [name] 09/13/2018, 11:44 AM

## 2018-09-14 ENCOUNTER — Inpatient Hospital Stay (HOSPITAL_COMMUNITY): Payer: Medicare Other

## 2018-09-14 ENCOUNTER — Inpatient Hospital Stay (HOSPITAL_COMMUNITY): Payer: Medicare Other | Admitting: Physical Therapy

## 2018-09-14 LAB — CBC
HCT: 23.8 % — ABNORMAL LOW (ref 39.0–52.0)
Hemoglobin: 7 g/dL — ABNORMAL LOW (ref 13.0–17.0)
MCH: 24.8 pg — ABNORMAL LOW (ref 26.0–34.0)
MCHC: 29.4 g/dL — ABNORMAL LOW (ref 30.0–36.0)
MCV: 84.4 fL (ref 80.0–100.0)
Platelets: 311 10*3/uL (ref 150–400)
RBC: 2.82 MIL/uL — ABNORMAL LOW (ref 4.22–5.81)
RDW: 18.2 % — ABNORMAL HIGH (ref 11.5–15.5)
WBC: 9 10*3/uL (ref 4.0–10.5)
nRBC: 0 % (ref 0.0–0.2)

## 2018-09-14 LAB — PREPARE RBC (CROSSMATCH)

## 2018-09-14 LAB — PROTIME-INR
INR: 2.6 — ABNORMAL HIGH (ref 0.8–1.2)
Prothrombin Time: 27.2 seconds — ABNORMAL HIGH (ref 11.4–15.2)

## 2018-09-14 MED ORDER — GERHARDT'S BUTT CREAM
TOPICAL_CREAM | Freq: Four times a day (QID) | CUTANEOUS | Status: DC
  Filled 2018-09-14: qty 1

## 2018-09-14 MED ORDER — MIDODRINE HCL 5 MG PO TABS
ORAL_TABLET | ORAL | Status: AC
  Filled 2018-09-14: qty 3

## 2018-09-14 MED ORDER — WARFARIN 0.5 MG HALF TABLET
0.5000 mg | ORAL_TABLET | Freq: Once | ORAL | Status: AC
  Filled 2018-09-14: qty 1

## 2018-09-14 MED ORDER — GERHARDT'S BUTT CREAM
TOPICAL_CREAM | Freq: Two times a day (BID) | CUTANEOUS | Status: DC
  Administered 2018-09-14 – 2018-09-18 (×8): via TOPICAL

## 2018-09-14 MED ORDER — GERHARDT'S BUTT CREAM
TOPICAL_CREAM | Freq: Every day | CUTANEOUS | Status: DC
  Administered 2018-09-14: 12:00:00 via TOPICAL

## 2018-09-14 MED ORDER — SODIUM CHLORIDE 0.9% IV SOLUTION: Freq: Once | INTRAVENOUS | Status: DC

## 2018-09-14 NOTE — Progress Notes (Signed)
Patient has had decline in wounds on flank. Will add Aquacell to absorb drainage --was a blister that has opened per nursing--get WOC to give input also.  Noted to have a large mass left breast under puckered incision from ICD removal. Reports tender to touch and may be getting bigger. Question calciphylaxis v/s mass. Will order ultrasound for work up.   Right flank   Left flank   [place] MASD

## 2018-09-14 NOTE — Progress Notes (Signed)
Occupational Therapy Session Note  Patient Details  Name: [name] [name] MRN: [id number] Date of Birth: [birth year]  Today's Date: 09/14/2018 OT Individual Time: 1345-1445 OT Individual Time Calculation (min): 60 min  15 min missed d/t fatigue  Short Term Goals: Week 2:  OT Short Term Goal 1 (Week 2): Pt will tolerate sitting upright out of bed for 3 hrs a day. OT Short Term Goal 2 (Week 2): Pt will perform sit to stand with mod A +1 for LB dressing  OT Short Term Goal 3 (Week 2): Pt will perform lateral transfers (scoot or squat) with min A  OT Short Term Goal 4 (Week 2): Pt will LB dressing with mod A (sit to stand).  Skilled Therapeutic Interventions/Progress Updates:    Upon entering room pt c/o 7/10 pain in buttocks, back and "all over". Pt agreeable to bed level therapy only. Extensive education provided re importance of OOB mobility on both functional activity tolerance/strength and on pain management. Pt listening but still declining any OOB mobility. Pt held 4 # dumbbells and completed BUE strengthening circuit with frequent cueing for encouragement and technique. Pt also completed glute bridges and core activation forward reaches and was edu on impact of these on pressure relief. Adjusted air mattress to facilitate pressure relief. Pt was left supine with all needs met.   Therapy Documentation Precautions:  Precautions Precautions: Fall Precaution Comments: pain along flanks and abdomen; ice as needed Restrictions Weight Bearing Restrictions: No General: General OT Amount of Missed Time: 15 Minutes PT Missed Treatment Reason: Wound care;Nursing care   Therapy/Group: Individual Therapy  Curtis Sites 09/14/2018, 3:06 PM

## 2018-09-14 NOTE — Progress Notes (Signed)
[place] for warfarin Indication: Mechanical valve  No Known Allergies  Patient Measurements: Weight: 268 lb 15.4 oz (122 kg)  Vital Signs: Temp: 98.9 F (37.2 C) (05/29 0817) Temp Source: Oral (05/29 0817) BP: 101/83 (05/29 0817) Pulse Rate: 102 (05/29 0817)  Labs: Recent Labs    09/11/18 1304 09/12/18 0530 09/13/18 0604 09/13/18 1348 09/14/18 0739  HGB 7.3* 7.5* 8.0*  --  7.0*  HCT 24.6* 24.8* 26.2*  --  23.8*  PLT 353 283 354  --  311  LABPROT  --  22.3* 23.7*  --  27.2*  INR  --  2.0* 2.2*  --  2.6*  CREATININE 8.42*  --   --  7.53*  --    Estimated Creatinine Clearance: 15.5 mL/min (A) (by C-G formula based on SCr of 7.53 mg/dL (H)).  Assessment: 59 YOM on warfarin PTA for mech aortic valve. Recent admission 4/15-4/21 forsupratherapeutic INR andruptured hemorrhagic renal cyst. Readmitted 5/3 with abdominal pain, gross hematuria, and supratherapeutic INR. CT abdomen at OSH showed increase in size of perinephric hematoma. Patient s/p embolization on 5/13. Last dose warfarin PTA was on 5/2 and patient is s/p 8 mg total of Vitamin K during hospitalization. INR goal has been decreased to 2-2.5 per MD. Warfarin resumed 5/14.    INR 2.6 today,   Po intake varies from day to day, confounding INR  Hgb 7.0 low-stable, Pltc WNL   Gross blood in stool 5/21 per notes  FOBT + on 5/22  Warfarin held 5/24 (INR 2.5) for probable biopsy (d/w MD), INR 2.3 on 5/26, Warf held again  Concern for calciphylaxis in long-term dialysis patient,  punch biopsy completed 5/26, reported normal tissue  Goal of Therapy:  INR 2-2.5 Monitor platelets by anticoagulation protocol: Yes   Plan:   Warfarin 0.5 mg x1 today at 1800  Daily PT/INR, CBC  Minda Ditto PharmD Clinical Pharmacist Phone [phone] Please utilize Amion for appropriate phone number to reach the unit pharmacist ([place] Guerra) 09/09/2018 2:23 AM

## 2018-09-14 NOTE — Progress Notes (Signed)
[facility] PHYSICAL MEDICINE & REHABILITATION PROGRESS NOTE   Subjective/Complaints: Abdominal pain still present but perhaps a little better. Feels he was able to do more in therapy yesterday. Trying to eat more  ROS: Patient denies fever, rash, sore throat, blurred vision, nausea, vomiting, diarrhea, cough, shortness of breath or chest pain, joint or back pain, headache, or mood change.   Objective:   No results found. Recent Labs    09/13/18 0604 09/14/18 0739  WBC 10.0 9.0  HGB 8.0* 7.0*  HCT 26.2* 23.8*  PLT 354 311   Recent Labs    09/11/18 1304 09/13/18 1348  NA 134* 134*  K 4.3 4.2  CL 97* 94*  CO2 24 24  GLUCOSE 85 93  BUN 68* 54*  CREATININE 8.42* 7.53*  CALCIUM 8.3* 8.5*    Intake/Output Summary (Last 24 hours) at 09/14/2018 1005 Last data filed at 09/13/2018 1849 Gross per 24 hour  Intake 120 ml  Output 2000 ml  Net -1880 ml     Physical Exam: Vital Signs Blood pressure 101/83, pulse (!) 102, temperature 98.9 F (37.2 C), temperature source Oral, resp. rate 16, weight 122 kg, SpO2 95 %. Constitutional: No distress . Vital signs reviewed. HEENT: EOMI, oral membranes moist Neck: supple Cardiovascular: RRR without murmur. No JVD    Respiratory: CTA Bilaterally without wheezes or rales. Normal effort    GI: BS +, remains tender to palp, numerous small nodules Musculoskeletal:  General: No edema.  Comments: Trace LE edema  Neurological: He is alertand oriented to person, place, and time. Nocranial nerve deficit.Coordinationnormal.  UE 5/5. LE: 3/5 HF, 3+ KE and 4/5 ADF/PF bilaterally. No focal sensory findings--no changes Skin: Skin iswarmand dry.  Multiple 1 to 2 cm subcutaneous soft nodules along abdomen. Psychiatric: pleasant    Assessment/Plan: 1. Functional deficits secondary to debility which require 3+ hours per day of interdisciplinary therapy in a comprehensive inpatient rehab setting.  Physiatrist is providing close team  supervision and 24 hour management of active medical problems listed below.  Physiatrist and rehab team continue to assess barriers to discharge/monitor patient progress toward functional and medical goals  Care Tool:  Bathing    Body parts bathed by patient: Right arm, Left arm, Chest, Abdomen, Front perineal area, Face   Body parts bathed by helper: Buttocks, Right upper leg, Left upper leg, Right lower leg, Left lower leg     Bathing assist Assist Level: Moderate Assistance - Patient 50 - 74%     Upper Body Dressing/Undressing Upper body dressing   What is the patient wearing?: Pull over shirt    Upper body assist Assist Level: Contact Guard/Touching assist    Lower Body Dressing/Undressing Lower body dressing      What is the patient wearing?: Pants     Lower body assist Assist for lower body dressing: Maximal Assistance - Patient 25 - 49%     Toileting Toileting Toileting Activity did not occur (Clothing management and hygiene only): N/A (no void or bm)(HD pt)  Toileting assist Assist for toileting: Dependent - Patient 0%     Transfers Chair/bed transfer  Transfers assist  Chair/bed transfer activity did not occur: Refused  Chair/bed transfer assist level: 2 Helpers     Locomotion Ambulation   Ambulation assist   Ambulation activity did not occur: Safety/medical concerns          Walk 10 feet activity   Assist  Walk 10 feet activity did not occur: Safety/medical concerns  Walk 50 feet activity   Assist Walk 50 feet with 2 turns activity did not occur: Safety/medical concerns         Walk 150 feet activity   Assist Walk 150 feet activity did not occur: Safety/medical concerns         Walk 10 feet on uneven surface  activity   Assist Walk 10 feet on uneven surfaces activity did not occur: Safety/medical concerns         Wheelchair     Assist     Wheelchair activity did not occur: Refused          Wheelchair 50 feet with 2 turns activity    Assist    Wheelchair 50 feet with 2 turns activity did not occur: Refused       Wheelchair 150 feet activity     Assist Wheelchair 150 feet activity did not occur: Refused        Medical Problem List and Plan: 1.Functional and mobility deficitssecondary to debility after multiple medical issues  -recent hx of bleeding peri-nephric cyst complicated by elevated INR --Continue CIR therapies including PT, OT,  [ -team continues to motivate pt push through pain and fatigue. 2. Antithrombotics: -DVT/anticoagulation:Pharmaceutical:Coumadin INR 2.6 today, adjust per pharm protocol -antiplatelet therapy: N/A 3. Pain Management:  PRN oxycodone-   -continue MS Contin 15mg  q12--no change for now .  Ice, heat prn to abdomen/flank.  4. Mood:LCSW to follow for evaluation and support. -antipsychotic agents: N/A 5. Neuropsych: This patientiscapable of making decisions on his own behalf. 6. Skin/Wound Care:Routine pressure relief measures.   -Abdominal nodules suspicious for calciphylaxis  -nephrology has added sensipar and sodium thiosulfate with HD  -surgical bx revealed normal tissue however. Re-bx? 7. Fluids/Electrolytes/Nutrition:Strict I/O with daily weights. Continue to offer nephro supplements and pro-stat.  -appreciate RD input  -inconsistent intake but improving  -continue megace trial with some benefit 8. Anemia of chronic disease: Received Feraheme 5/18 and on Aranesp weekly. .  -hgb 7.4 9. CAD s/p AVR/ICM s/p QAE:SLPNPYY HR BID--continue amiodarone daily. On coumadin-- 10. Polycystic kidney disease/ESRD:HD scheduled at end of the day to allow full participation in therapies 11. Chronic hypotension: On midodrine tid--SBP runs in 90's. 12. Gross blood in stool--none currently .    -however HGB 7.0 5/29  -transfusion in HD as needed  -continue to monitor  closely     LOS: 10 days A FACE TO [place] 09/14/2018, 10:05 AM

## 2018-09-14 NOTE — Procedures (Signed)
I was present at this dialysis session. I have reviewed the session itself and made appropriate changes.   Vital signs in last 24 hours:  Temp:  [98.6 F (37 C)-99 F (37.2 C)] 99 F (37.2 C) (05/29 1330) Pulse Rate:  [97-105] 100 (05/29 1330) Resp:  [16-18] 18 (05/29 1330) BP: (84-102)/(41-83) 87/50 (05/29 1330) SpO2:  [95 %-98 %] 98 % (05/29 1330) Weight:  [122 kg] 122 kg (05/29 0527) Weight change: 0 kg Filed Weights   09/13/18 1325 09/13/18 1733 09/14/18 0527  Weight: 124 kg 122 kg 122 kg    Recent Labs  Lab 09/14/18 0739  NA 137  K 3.5  CL 98  CO2 26  GLUCOSE 95  BUN 30*  CREATININE 4.95*  CALCIUM 8.4*  PHOS 3.7    Recent Labs  Lab 09/12/18 0530 09/13/18 0604 09/14/18 0739  WBC 7.4 10.0 9.0  HGB 7.5* 8.0* 7.0*  HCT 24.8* 26.2* 23.8*  MCV 83.5 83.2 84.4  PLT 283 354 311    Scheduled Meds: . sodium chloride   Intravenous Once  . acetaminophen  650 mg Oral Q6H  . amiodarone  200 mg Oral Daily  . atorvastatin  80 mg Oral q1800  . Chlorhexidine Gluconate Cloth  6 each Topical Q0600  . cinacalcet  30 mg Oral Q supper  . darbepoetin (ARANESP) injection - DIALYSIS  100 mcg Intravenous Q Tue-HD  . feeding supplement (NEPRO CARB STEADY)  237 mL Oral BID BM  . feeding supplement (PRO-STAT SUGAR FREE 64)  30 mL Oral TID  . Gerhardt's butt cream   Topical Daily  . Gerhardt's butt cream   Topical BID  . lanthanum  1,000 mg Oral TID PC  . megestrol  400 mg Oral BID  . midodrine  15 mg Oral TID WC  . morphine  15 mg Oral Q12H  . multivitamin  1 tablet Oral QHS  . warfarin  0.5 mg Oral ONCE-1800  . Warfarin - Pharmacist Dosing Inpatient   Does not apply q1800   Continuous Infusions: . sodium thiosulfate infusion for calciphylaxis Stopped (09/11/18 1713)   PRN Meds:.acetaminophen, alum & mag hydroxide-simeth, bisacodyl, diphenhydrAMINE, guaiFENesin-dextromethorphan, lanthanum, oxyCODONE, polyethylene glycol, prochlorperazine **OR** prochlorperazine **OR**  prochlorperazine, sodium chloride flush, traMADol, traZODone    Assessment/ Plan:  1. Left-sided perinephric hematoma- in setting of supratherapeutic INR s/p 7 units of PRBC's and s/p transcatheter embolization on 08/29/18. 1. Hgb dropped to 7 will transfuse 2 units PRBC's with HD today. 2. ESRDtolerating HD, cont with TTS schedule.  Off schedule today due to anemia and need for blood transfusion.  Will get back on schedule next week. 3. 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 1. S/pdeep tissue biopsy5/26/20and appreciate General Surgery's assistance, however bx without calciphylaxis. 2. Still suspicious and will continue with current treatment and consider repeat biopsy to another site or deeper sample. 3. Continue with pain management and aggressive phos control 4. Anemia:ACD superimprosed with ABLA- on ESA and transfuse prn 5. 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 Severe protein malnutrition- recommend supplements 7. Nutrition:renal diet 8. Hypertension:stable/chronic hypotension on midodrine 9. AVR- on coumadin 10. Atrial fibrillation- on amio and anticoagulation    Donetta Potts,  MD 09/14/2018, 5:55 PM

## 2018-09-14 NOTE — Progress Notes (Signed)
Physical Therapy Session Note  Patient Details  Name: [name] [name] MRN: [id number] Date of Birth: Jul 12, 1965  Today's Date: 09/14/2018     Short Term Goals: Week 1:  PT Short Term Goal 1 (Week 1): Pt will initiate OOB mobility PT Short Term Goal 1 - Progress (Week 1): Met PT Short Term Goal 2 (Week 1): Pt will initiate gait training PT Short Term Goal 2 - Progress (Week 1): Not met PT Short Term Goal 3 (Week 1): Pt will perform sit<>stand w/ max assist x1 PT Short Term Goal 3 - Progress (Week 1): Not met PT Short Term Goal 4 (Week 1): Pt will tolerate 30 min of upright activity w/o increase in fatigue PT Short Term Goal 4 - Progress (Week 1): Not met Week 2:  PT Short Term Goal 1 (Week 2): pt will tolerate semi upright position x 2 hours in w/c or recliner 5/7 days  PT Short Term Goal 2 (Week 2): pt will transfer with assist of 1, 50 % of trials PT Short Term Goal 3 (Week 2): pt wil move sit>< stand with +2 and tolerate x 30 seconds Week 3:     Skilled Therapeutic Interventions/Progress Updates:   PT attempted to see Pt for PT treatment this AM, but RN reports that he is going off unit for procedure in near Future. PT offered to instruct pt in bed level therapy and educated pt on benefits of continued Physical Activity, but Pt declined bed level therapy at this time. Will re-attempt at later time/date, provided pt in medically appropriate.     PT re-attempted to see pt, but hospital transport present to take pt to dialysis. Will re-attempt at later time.    Therapy Documentation Precautions:  Precautions Precautions: Fall Precaution Comments: pain along flanks and abdomen; ice as needed Restrictions Weight Bearing Restrictions: No General: PT Amount of Missed Time (min): 60 Minutes PT Missed Treatment Reason: Unavailable (Comment)(off unit procedure and dialysis) Vital Signs: Therapy Vitals Temp: 98.6 F (37 C) Temp Source: Oral Pulse Rate: 100 Resp: 17 BP: (!)  84/51 Patient Position (if appropriate): Lying Oxygen Therapy SpO2: 95 % O2 Device: Room Air    Therapy/Group: Individual Therapy  Lorie Phenix 09/14/2018, 8:01 AM

## 2018-09-14 NOTE — Progress Notes (Signed)
Physical Therapy Session Note  Patient Details  Name: [name] [name] MRN: [id number] Date of Birth: 1966/01/26  Today's Date: 09/14/2018 PT Individual Time: 0915-1030 PT Individual Time Calculation (min): 75 min   Short Term Goals: Week 2:  PT Short Term Goal 1 (Week 2): pt will tolerate semi upright position x 2 hours in w/c or recliner 5/7 days  PT Short Term Goal 2 (Week 2): pt will transfer with assist of 1, 50 % of trials PT Short Term Goal 3 (Week 2): pt wil move sit>< stand with +2 and tolerate x 30 seconds  Skilled Therapeutic Interventions/Progress Updates:     Patient in bed upon PT arrival. Patient alert and agreeable to PT session. PT donned B TEDs and shorts with patient rolling with min A in the bed at beginning of session. Patient asked if he would be getting up to the TIS w/c today and stated that he couldn't tolerate it because of 10/10 sacral pain and reported that nursing wanted to look at his wound. RN was called in and inspected and redressed wounds with charge nurse and Jeannene Patella, PA. Missed 15 min of skilled PT due to nursing care/wound care. Patient also complained of tenderness to palpation underneath L breast tissue, PT noted hard and tender mass, RN and PA aware. Patient to have imaging/testing done later today, RN requested patient remain in the bed at end of session.   Therapeutic Activity: Bed Mobility: Patient performed rolling in bed with mod A of 2 people and maintained rolling on R side with CGA holding onto bed rail while dressings were changed by nursing. Patient fatigued and reported 8-9/10 pain following dressing changes and declined sitting EOB due to sacral pain. Limited remainder of session to bed level exercises.   Therapeutic Exercise: Patient performed the following exercises with verbal and tactile cues for proper technique.  -B heel slides 2/10 -B SAQ 2x10 -B SLR 2x10 with min A -B isometric hip adduction with 5 sec hold 2x10 -B shoulder IR with green  band 2x10 -B bicep curls with green band 2x10 -B shoulder flexion with isometric scapular retraction with tension on green band 2x10  Patient in bed at end of session with breaks locked, bed alarm set, and all needs within reach.   Therapy Documentation Precautions:  Precautions Precautions: Fall Precaution Comments: pain along flanks and abdomen; ice as needed Restrictions Weight Bearing Restrictions: No General: PT Amount of Missed Time (min): 15 Minutes PT Missed Treatment Reason: Wound care;Nursing care Vital Signs: Therapy Vitals Pulse Rate: (!) 105 BP: (!) 92/52 Patient Position (if appropriate): Lying Pain:  10/10 sacral pain and 8-9/10 abdominal pain, repositioned and provided relaxation techniques throughout session for pain interventions.   Therapy/Group: Individual Therapy  Gissel Keilman L Arelene Moroni PT, DPT  09/14/2018, 1:10 PM

## 2018-09-14 NOTE — Consult Note (Signed)
[facility] Nurse wound consult note Reason for Consult: Ruptured blisters to left and right flank/posterior trunk with peeling epithelium.  Stage 3 pressure injury to right ischium with moisture associated skin damage to buttocks and sacrum.  LEft ischium with scarring from newly epithelialized wounds.  Wound type:pressure and moisture.  Pressure Injury POA: Yes Measurement:Left flank: 3 cm x 1.5 cm scattered nonintact lesion with peeling epithelium from ruptured blister.  Right flank, partial thickness 1 cm x 1 cm with peeling epithelium to periwound.  Sacrum and buttocks 8 cm x 6 cm pink tissue with partial thickness skin loss between gluteal skin folds . Right ischium:  1 cm x 2 cm x 0.3 cm  Left ischium:  1 cm round pink epithelial tissue Wound bed:see above Drainage (amount, consistency, odor) moderate serosanguinous musty odor Periwound:scarring from healed wounds Dressing procedure/placement/frequency:Cleanse left and right flank and right ischial wound with NS and pat dry.  Apply Aquacel Ag to open areas and cover with foam dressings.  Change M/W/F.  Apply Gerhardts butt paste to buttocks and sacral breakdown. Twice daily and PRN soilage.   Will not follow at this time.  Please re-consult if needed.  Domenic Moras MSN, RN, FNP-BC CWON Wound, Ostomy, Continence Nurse Pager [phone]

## 2018-09-15 ENCOUNTER — Inpatient Hospital Stay (HOSPITAL_COMMUNITY): Payer: Medicare Other | Admitting: Physical Therapy

## 2018-09-15 DIAGNOSIS — R229 Localized swelling, mass and lump, unspecified: Secondary | ICD-10-CM

## 2018-09-15 DIAGNOSIS — I953 Hypotension of hemodialysis: Secondary | ICD-10-CM

## 2018-09-15 DIAGNOSIS — R1084 Generalized abdominal pain: Secondary | ICD-10-CM

## 2018-09-15 DIAGNOSIS — D638 Anemia in other chronic diseases classified elsewhere: Secondary | ICD-10-CM

## 2018-09-15 DIAGNOSIS — IMO0002 Reserved for concepts with insufficient information to code with codable children: Secondary | ICD-10-CM

## 2018-09-15 LAB — RENAL FUNCTION PANEL
Albumin: 1.5 g/dL — ABNORMAL LOW (ref 3.5–5.0)
Anion gap: 13 (ref 5–15)
BUN: 30 mg/dL — ABNORMAL HIGH (ref 6–20)
CO2: 26 mmol/L (ref 22–32)
Calcium: 8.4 mg/dL — ABNORMAL LOW (ref 8.9–10.3)
Chloride: 98 mmol/L (ref 98–111)
Creatinine, Ser: 4.95 mg/dL — ABNORMAL HIGH (ref 0.61–1.24)
GFR calc Af Amer: 14 mL/min — ABNORMAL LOW (ref >60)
GFR calc non Af Amer: 12 mL/min — ABNORMAL LOW (ref >60)
Glucose, Bld: 95 mg/dL (ref 70–99)
Phosphorus: 3.7 mg/dL (ref 2.5–4.6)
Potassium: 3.5 mmol/L (ref 3.5–5.1)
Sodium: 137 mmol/L (ref 135–145)

## 2018-09-15 LAB — TYPE AND SCREEN
ABO/RH(D): B POS
Antibody Screen: NEGATIVE
Unit division: 0

## 2018-09-15 LAB — CBC
HCT: 28.6 % — ABNORMAL LOW (ref 39.0–52.0)
Hemoglobin: 8.8 g/dL — ABNORMAL LOW (ref 13.0–17.0)
MCH: 25.7 pg — ABNORMAL LOW (ref 26.0–34.0)
MCHC: 30.8 g/dL (ref 30.0–36.0)
MCV: 83.4 fL (ref 80.0–100.0)
Platelets: 325 10*3/uL (ref 150–400)
RBC: 3.43 MIL/uL — ABNORMAL LOW (ref 4.22–5.81)
RDW: 17.6 % — ABNORMAL HIGH (ref 11.5–15.5)
WBC: 9.9 10*3/uL (ref 4.0–10.5)
nRBC: 0 % (ref 0.0–0.2)

## 2018-09-15 LAB — BPAM RBC
Blood Product Expiration Date: 202006142359
Blood Product Expiration Date: 202006142359
ISSUE DATE / TIME: 202005291834
ISSUE DATE / TIME: 202005291847
Unit Type and Rh: 7300
Unit Type and Rh: 7300

## 2018-09-15 LAB — PROTIME-INR
INR: 2.3 — ABNORMAL HIGH (ref 0.8–1.2)
Prothrombin Time: 24.9 seconds — ABNORMAL HIGH (ref 11.4–15.2)

## 2018-09-15 MED ORDER — WARFARIN SODIUM 2 MG PO TABS
2.0000 mg | ORAL_TABLET | Freq: Once | ORAL | Status: AC
  Administered 2018-09-15: 2 mg via ORAL
  Filled 2018-09-15: qty 1

## 2018-09-15 NOTE — Progress Notes (Signed)
[facility] PHYSICAL MEDICINE & REHABILITATION PROGRESS NOTE   Subjective/Complaints: Patient seen laying in bed this morning.  He states he did not sleep well overnight because of pain, but this was relieved with medications.  ROS: Denies CP, SOB, N/V/D  Objective:   [place] Chest Soft Tissue  Result Date: 09/14/2018 CLINICAL DATA:  Painful left chest wall mass. EXAM: ULTRASOUND OF CHEST SOFT TISSUES TECHNIQUE: Ultrasound examination of the chest wall soft tissues was performed in the area of clinical concern. COMPARISON:  CT abdomen pelvis dated July 23, 2018. CT chest dated January 13, 2018. FINDINGS: Focused ultrasound in the area of clinical concern over the left lateral chest wall demonstrates ill-defined 6.3 x 2.9 cm complex fluid. There is no discrete soft tissue mass. IMPRESSION: 1. Ill-defined 6.3 x 2.9 cm complex fluid in the left lateral chest wall. This correlates with an area of ill-defined hyperdensity seen on recent CTs, and is at the site of prior left chest wall pacemaker based on chest CT from September 2019. 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 2. No discrete soft tissue mass. Electronically Signed   By: Titus Dubin M.D.   On: 09/14/2018 13:10   Recent Labs    09/14/18 0739 09/15/18 0715  WBC 9.0 9.9  HGB 7.0* 8.8*  HCT 23.8* 28.6*  PLT 311 325   Recent Labs    09/13/18 1348 09/14/18 0739  NA 134* 137  K 4.2 3.5  CL 94* 98  CO2 24 26  GLUCOSE 93 95  BUN 54* 30*  CREATININE 7.53* 4.95*  CALCIUM 8.5* 8.4*    Intake/Output Summary (Last 24 hours) at 09/15/2018 1122 Last data filed at 09/15/2018 0900 Gross per 24 hour  Intake 870 ml  Output 1870 ml  Net -1000 ml     Physical Exam: Vital Signs Blood pressure (!) 80/46, pulse (!) 105, temperature 98.9 F (37.2 C), temperature source Oral, resp. rate 19, weight 122 kg, SpO2 94 %. Constitutional: No distress . Vital signs reviewed. HENT: Normocephalic.  Atraumatic. Eyes:  EOMI. No discharge. Cardiovascular: No JVD. Respiratory: Normal effort. GI: Non-distended. Musc: Bilateral lower extremity edema Neurological: Alert and oriented Motor: UE 5/5.  Bilateral lower extremities: LE: Grossly 4/5 proximal to distal Skin: Skin iswarmand dry.  Multiple 1 to 2 cm subcutaneous soft nodules along abdomen. Psychiatric: pleasant   Assessment/Plan: 1. Functional deficits secondary to debility which require 3+ hours per day of interdisciplinary therapy in a comprehensive inpatient rehab setting.  Physiatrist is providing close team supervision and 24 hour management of active medical problems listed below.  Physiatrist and rehab team continue to assess barriers to discharge/monitor patient progress toward functional and medical goals  Care Tool:  Bathing    Body parts bathed by patient: Right arm, Left arm, Chest, Abdomen, Front perineal area, Face   Body parts bathed by helper: Buttocks, Right upper leg, Left upper leg, Right lower leg, Left lower leg     Bathing assist Assist Level: Moderate Assistance - Patient 50 - 74%     Upper Body Dressing/Undressing Upper body dressing   What is the patient wearing?: Pull over shirt    Upper body assist Assist Level: Contact Guard/Touching assist    Lower Body Dressing/Undressing Lower body dressing      What is the patient wearing?: Pants     Lower body assist Assist for lower body dressing: Maximal Assistance - Patient 25 - 49%     Toileting Toileting Toileting Activity did not occur (  Clothing management and hygiene only): N/A (no void or bm)(HD pt)  Toileting assist Assist for toileting: Dependent - Patient 0%     Transfers Chair/bed transfer  Transfers assist  Chair/bed transfer activity did not occur: Refused  Chair/bed transfer assist level: 2 Helpers     Locomotion Ambulation   Ambulation assist   Ambulation activity did not occur: Safety/medical concerns          Walk 10  feet activity   Assist  Walk 10 feet activity did not occur: Safety/medical concerns        Walk 50 feet activity   Assist Walk 50 feet with 2 turns activity did not occur: Safety/medical concerns         Walk 150 feet activity   Assist Walk 150 feet activity did not occur: Safety/medical concerns         Walk 10 feet on uneven surface  activity   Assist Walk 10 feet on uneven surfaces activity did not occur: Safety/medical concerns         Wheelchair     Assist     Wheelchair activity did not occur: Refused         Wheelchair 50 feet with 2 turns activity    Assist    Wheelchair 50 feet with 2 turns activity did not occur: Refused       Wheelchair 150 feet activity     Assist Wheelchair 150 feet activity did not occur: Refused        Medical Problem List and Plan: 1.Functional and mobility deficitssecondary to debility after multiple medical issues  -recent hx of bleeding peri-nephric cyst complicated by elevated INR Continue CIR  Notes reviewed- multiple medical issues, labs reviewed 2. Antithrombotics: -DVT/anticoagulation:Pharmaceutical:Coumadin INR therapeutic on 5/30 -antiplatelet therapy: N/A 3. Pain Management:  PRN oxycodone-   -continue MS Contin 15mg  q12 .  Ice, heat prn to abdomen/flank.  4. Mood:LCSW to follow for evaluation and support. -antipsychotic agents: N/A 5. Neuropsych: This patientiscapable of making decisions on his own behalf. 6. Skin/Wound Care:Routine pressure relief measures.   -Abdominal nodules suspicious for calciphylaxis  -nephrology has added sensipar and sodium thiosulfate with HD  -surgical bx revealed normal tissue however. Re-bx? 7. Fluids/Electrolytes/Nutrition:Strict I/O with daily weights. Continue to offer nephro supplements and pro-stat.  -appreciate RD input  P.o. intake labile on 5/30  -continue megace trial  8. Anemia of chronic  disease: Received Feraheme 5/18 and on Aranesp weekly. .  Hemoglobin 8.8 on 5/30, after transfusion on 5/39 9. CAD s/p AVR/ICM s/p GYJ:EHUDJSH HR BID--continue amiodarone daily. On coumadin 10. Polycystic kidney disease/ESRD:HD scheduled at end of the day to allow full participation in therapies 11. Chronic hypotension: On midodrine tid  Labile on 5/30 with further hypotension, but appears asymptomatic at this time, continue to monitor  LOS: 11 days A FACE TO FACE EVALUATION WAS PERFORMED  [name] [name] Lorie Phenix 09/15/2018, 11:22 AM

## 2018-09-15 NOTE — Progress Notes (Signed)
Patient ID: [name] [name], male   DOB: Oct 16, 1965, 53 y.o.   MRN: [id number] S: No new complaints and received 2 units PRBC's with HD yesterday O:BP (!) 80/46 (BP Location: Right Arm)   Pulse (!) 105   Temp 98.9 F (37.2 C) (Oral)   Resp 19   Wt 122 kg   SpO2 94%   BMI 35.49 kg/m   Intake/Output Summary (Last 24 hours) at 09/15/2018 1128 Last data filed at 09/15/2018 0900 Gross per 24 hour  Intake 870 ml  Output 1870 ml  Net -1000 ml   Intake/Output: I/O last 3 completed shifts: In: 750 [P.O.:120; Blood:630] Out: 1870 [Other:1870]  Intake/Output this shift:  Total I/O In: 120 [P.O.:120] Out: -  Weight change: -2 kg Gen: NAD CVS: no rub Resp: cta Abd: +BS, obese, multiple tender subcutaneous nodules Ext: trace edema, lavf +T/B  Recent Labs  Lab 09/08/18 1317 09/11/18 1304 09/13/18 1348 09/14/18 0739  NA 134* 134* 134* 137  K 3.5 4.3 4.2 3.5  CL 95* 97* 94* 98  CO2 25 24 24 26   GLUCOSE 94 85 93 95  BUN 49* 68* 54* 30*  CREATININE 6.67* 8.42* 7.53* 4.95*  ALBUMIN 1.4* 1.4* 1.4* 1.5*  CALCIUM 8.5* 8.3* 8.5* 8.4*  PHOS 4.0 4.1 4.4 3.7   Liver Function Tests: Recent Labs  Lab 09/11/18 1304 09/13/18 1348 09/14/18 0739  ALBUMIN 1.4* 1.4* 1.5*   No results for input(s): LIPASE, AMYLASE in the last 168 hours. No results for input(s): AMMONIA in the last 168 hours. CBC: Recent Labs  Lab 09/11/18 1304 09/12/18 0530 09/13/18 0604 09/14/18 0739 09/15/18 0715  WBC 9.2 7.4 10.0 9.0 9.9  HGB 7.3* 7.5* 8.0* 7.0* 8.8*  HCT 24.6* 24.8* 26.2* 23.8* 28.6*  MCV 86.3 83.5 83.2 84.4 83.4  PLT 353 283 354 311 325   Cardiac Enzymes: No results for input(s): CKTOTAL, CKMB, CKMBINDEX, TROPONINI in the last 168 hours. CBG: No results for input(s): GLUCAP in the last 168 hours.  Iron Studies: No results for input(s): IRON, TIBC, TRANSFERRIN, FERRITIN in the last 72 hours. Studies/Results: [place] Chest Soft Tissue  Result Date: 09/14/2018 CLINICAL DATA:  Painful left chest  wall mass. EXAM: ULTRASOUND OF CHEST SOFT TISSUES TECHNIQUE: Ultrasound examination of the chest wall soft tissues was performed in the area of clinical concern. COMPARISON:  CT abdomen pelvis dated July 23, 2018. CT chest dated January 13, 2018. FINDINGS: Focused ultrasound in the area of clinical concern over the left lateral chest wall demonstrates ill-defined 6.3 x 2.9 cm complex fluid. There is no discrete soft tissue mass. IMPRESSION: 1. Ill-defined 6.3 x 2.9 cm complex fluid in the left lateral chest wall. This correlates with an area of ill-defined hyperdensity seen on recent CTs, and is at the site of prior left chest wall pacemaker based on chest CT from September 2019. 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 2. No discrete soft tissue mass. Electronically Signed   By: Titus Dubin M.D.   On: 09/14/2018 13:10   . sodium chloride   Intravenous Once  . acetaminophen  650 mg Oral Q6H  . amiodarone  200 mg Oral Daily  . atorvastatin  80 mg Oral q1800  . Chlorhexidine Gluconate Cloth  6 each Topical Q0600  . cinacalcet  30 mg Oral Q supper  . darbepoetin (ARANESP) injection - DIALYSIS  100 mcg Intravenous Q Tue-HD  . feeding supplement (NEPRO CARB STEADY)  237 mL Oral BID BM  . feeding  supplement (PRO-STAT SUGAR FREE 64)  30 mL Oral TID  . Gerhardt's butt cream   Topical BID  . lanthanum  1,000 mg Oral TID PC  . megestrol  400 mg Oral BID  . midodrine  15 mg Oral TID WC  . morphine  15 mg Oral Q12H  . multivitamin  1 tablet Oral QHS  . warfarin  0.5 mg Oral ONCE-1800  . warfarin  2 mg Oral ONCE-1800  . Warfarin - Pharmacist Dosing Inpatient   Does not apply q1800    BMET    Component Value Date/Time   NA 137 09/14/2018 0739   NA 141 02/09/2018 1011   NA 137 11/13/2013 0408   K 3.5 09/14/2018 0739   K 4.1 11/13/2013 0408   CL 98 09/14/2018 0739   CL 98 11/13/2013 0408   CO2 26 09/14/2018 0739   CO2 25 11/13/2013 0408   GLUCOSE 95 09/14/2018 0739    GLUCOSE 64 (L) 11/13/2013 0408   BUN 30 (H) 09/14/2018 0739   BUN 40 (H) 02/09/2018 1011   BUN 68 (H) 11/13/2013 0408   CREATININE 4.95 (H) 09/14/2018 0739   CREATININE 16.45 (H) 11/13/2013 0408   CALCIUM 8.4 (L) 09/14/2018 0739   CALCIUM 6.8 (LL) 11/13/2013 0408   GFRNONAA 12 (L) 09/14/2018 0739   GFRNONAA 3 (L) 11/13/2013 0408   GFRAA 14 (L) 09/14/2018 0739   GFRAA 3 (L) 11/13/2013 0408   CBC    Component Value Date/Time   WBC 9.9 09/15/2018 0715   RBC 3.43 (L) 09/15/2018 0715   HGB 8.8 (L) 09/15/2018 0715   HGB 10.9 (L) 02/09/2018 1011   HCT 28.6 (L) 09/15/2018 0715   HCT 35.8 (L) 02/09/2018 1011   PLT 325 09/15/2018 0715   PLT 260 02/09/2018 1011   MCV 83.4 09/15/2018 0715   MCV 80 02/09/2018 1011   MCV 77 (L) 11/13/2013 0408   MCH 25.7 (L) 09/15/2018 0715   MCHC 30.8 09/15/2018 0715   RDW 17.6 (H) 09/15/2018 0715   RDW 15.9 (H) 02/09/2018 1011   RDW 17.1 (H) 11/13/2013 0408   LYMPHSABS 1.0 09/06/2018 1305   LYMPHSABS 1.0 02/09/2018 1011   LYMPHSABS 0.7 (L) 11/13/2013 0408   MONOABS 0.7 09/06/2018 1305   MONOABS 0.4 11/13/2013 0408   EOSABS 0.7 (H) 09/06/2018 1305   EOSABS 0.7 (H) 02/09/2018 1011   EOSABS 0.6 11/13/2013 0408   BASOSABS 0.1 09/06/2018 1305   BASOSABS 0.1 02/09/2018 1011   BASOSABS 0.1 11/13/2013 0408     Assessment/ Plan:  1. Left-sided perinephric hematoma- in setting of supratherapeutic INR s/p 7 units of PRBC's and s/p transcatheter embolization on 08/29/18. 1. Hgb dropped to 7 and transfused 2 units PRBC's with HD 09/14/18. 2. ESRDtolerating HD, cont with TTS schedule.  Off schedule today due to anemia and need for blood transfusion.  Will get back on schedule next week. Plan HD on Monday and then Thursday and Saturday. 3. 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 1. S/pdeep tissue biopsy5/26/20and appreciate General Surgery's assistance, however bx without calciphylaxis. 2. Still suspicious and will continue  with current treatment and consider repeat biopsy to another site or deeper sample. 3. Continue with pain management and aggressive phos control 4. Anemia:ACD superimprosed with ABLA- on ESA and transfuse prn 5. CKD-MBD:non-calcium based binders, senispar, sodium thiosulfate for probablecalciphylaxis 6. Severe protein malnutrition- recommend supplements 7. Nutrition:renal diet 8. Hypertension:stable/chronic hypotension on midodrine 9. AVR- on coumadin 10. Atrial fibrillation- on amio and anticoagulation  Donetta Potts, MD [employer] [phone]

## 2018-09-15 NOTE — Progress Notes (Signed)
Physical Therapy Session Note  Patient Details  Name: [name] [name] MRN: [id number] Date of Birth: 1965-11-06  Today's Date: 09/15/2018 PT Individual Time: 1126-1155 PT Individual Time Calculation (min): 29 min  Missed minutes (16 min)  Short Term Goals: Week 2:  PT Short Term Goal 1 (Week 2): pt will tolerate semi upright position x 2 hours in w/c or recliner 5/7 days  PT Short Term Goal 2 (Week 2): pt will transfer with assist of 1, 50 % of trials PT Short Term Goal 3 (Week 2): pt wil move sit>< stand with +2 and tolerate x 30 seconds  Skilled Therapeutic Interventions/Progress Updates:      Therapy Documentation Precautions:  Precautions Precautions: Fall Precaution Comments: pain along flanks and abdomen; ice as needed Restrictions Weight Bearing Restrictions: No    Treatment: Start of therapy delayed due to pt with nurse and nurse tech for patient care/bathing due to incontinent episode. Once completed NT notified PTA, RN in still in room. Pt complaining of nausea and began to dry heave. Emesis bag provided for pt while RN check for nausea medication. RN administered IM nausea medicine. After ~5 minutes pt agreed to try bed level exercises.  Max assist of 2 people to scoot pt to [facility] with pt using UE's to pull on head rail. Bil LE's: ankle pumps x 20, quad sets with 5 sec holds x 10 reps. Heel slides with manually resisted extension, straight leg raises, short arc quads and level 3 band resisted hip abduction for 2 sets of 10 reps each.   At this time pt reporting feeling too sleepy from medication to continue with improved nausea. Pt left with HOB 30 degrees and all needs in reach. RN notified of pt reporting nausea improved.    Therapy/Group: Individual Therapy  Lindon Romp, PTA, CLT 09/15/18, 12:33 PM

## 2018-09-15 NOTE — Progress Notes (Signed)
Pt vomited x 1 today and been c/o nauseous throughout the day. Pt received compazine IM x 2. Last dose given at 1527. Pt stated he does feel some better however has refused meals all shift. Has been drinking fluids. Will pass along to night shift regarding N/V. Will cont to monitor.   Rosita Fire, RN

## 2018-09-15 NOTE — Progress Notes (Signed)
[name] for warfarin Indication: Mechanical valve  No Known Allergies  Patient Measurements: Weight: 268 lb 15.4 oz (122 kg)  Vital Signs: Temp: 98.9 F (37.2 C) (05/30 0413) Temp Source: Oral (05/30 0413) BP: 80/46 (05/30 0413) Pulse Rate: 105 (05/30 0413)  Labs: Recent Labs    09/13/18 0604 09/13/18 1348 09/14/18 0739 09/15/18 0715  HGB 8.0*  --  7.0* 8.8*  HCT 26.2*  --  23.8* 28.6*  PLT 354  --  311 325  LABPROT 23.7*  --  27.2* 24.9*  INR 2.2*  --  2.6* 2.3*  CREATININE  --  7.53* 4.95*  --    Estimated Creatinine Clearance: 23.6 mL/min (A) (by C-G formula based on SCr of 4.95 mg/dL (H)).  Assessment: [name] [name] on warfarin PTA for mech aortic valve. Recent admission 4/15-4/21 forsupratherapeutic INR andruptured hemorrhagic renal cyst. Readmitted 5/3 with abdominal pain, gross hematuria, and supratherapeutic INR. CT abdomen at OSH showed increase in size of perinephric hematoma. Patient s/p embolization on 5/13. Last dose warfarin PTA was on 5/2 and patient is s/p 8 mg total of Vitamin K during hospitalization. INR goal has been decreased to 2-2.5 per MD. Warfarin resumed 5/14.   INR today 2.3  Goal of Therapy:  INR 2-2.5 Monitor platelets by anticoagulation protocol: Yes   Plan:   Warfarin 2 mg x1 today at 1800  Daily PT/INR, CBC  Thank you Anette Guarneri, PharmD [phone] Please utilize Amion for appropriate phone number to reach the unit pharmacist ([place]) 09/09/2018 2:23 AM

## 2018-09-16 ENCOUNTER — Inpatient Hospital Stay (HOSPITAL_COMMUNITY): Payer: Medicare Other | Admitting: Physical Therapy

## 2018-09-16 DIAGNOSIS — R0989 Other specified symptoms and signs involving the circulatory and respiratory systems: Secondary | ICD-10-CM

## 2018-09-16 LAB — CBC
HCT: 32.9 % — ABNORMAL LOW (ref 39.0–52.0)
Hemoglobin: 9.6 g/dL — ABNORMAL LOW (ref 13.0–17.0)
MCH: 25.7 pg — ABNORMAL LOW (ref 26.0–34.0)
MCHC: 29.2 g/dL — ABNORMAL LOW (ref 30.0–36.0)
MCV: 88.2 fL (ref 80.0–100.0)
Platelets: 273 10*3/uL (ref 150–400)
RBC: 3.73 MIL/uL — ABNORMAL LOW (ref 4.22–5.81)
RDW: 18.5 % — ABNORMAL HIGH (ref 11.5–15.5)
WBC: 12.3 10*3/uL — ABNORMAL HIGH (ref 4.0–10.5)
nRBC: 0 % (ref 0.0–0.2)

## 2018-09-16 LAB — PROTIME-INR
INR: 2.3 — ABNORMAL HIGH (ref 0.8–1.2)
Prothrombin Time: 25.2 seconds — ABNORMAL HIGH (ref 11.4–15.2)

## 2018-09-16 MED ORDER — WARFARIN SODIUM 2 MG PO TABS
2.0000 mg | ORAL_TABLET | Freq: Once | ORAL | Status: AC
  Administered 2018-09-16: 2 mg via ORAL
  Filled 2018-09-16: qty 1

## 2018-09-16 NOTE — Progress Notes (Signed)
[place] for warfarin Indication: Mechanical valve  No Known Allergies  Patient Measurements: Weight: 262 lb 5.6 oz (119 kg)  Vital Signs: Temp: 98.2 F (36.8 C) (05/31 0525) Temp Source: Oral (05/31 0525) BP: 107/58 (05/31 0529) Pulse Rate: 93 (05/31 0529)  Labs: Recent Labs    09/13/18 1348  09/14/18 0739 09/15/18 0715 09/16/18 0710 09/16/18 0844  HGB  --    < > 7.0* 8.8* 9.6*  --   HCT  --   --  23.8* 28.6* 32.9*  --   PLT  --   --  311 325 273  --   LABPROT  --   --  27.2* 24.9*  --  25.2*  INR  --   --  2.6* 2.3*  --  2.3*  CREATININE 7.53*  --  4.95*  --   --   --    < > = values in this interval not displayed.   Estimated Creatinine Clearance: 23.3 mL/min (A) (by C-G formula based on SCr of 4.95 mg/dL (H)).  Assessment: 38 YOM on warfarin PTA for mech aortic valve. Recent admission 4/15-4/21 forsupratherapeutic INR andruptured hemorrhagic renal cyst. Readmitted 5/3 with abdominal pain, gross hematuria, and supratherapeutic INR. CT abdomen at OSH showed increase in size of perinephric hematoma. Patient s/p embolization on 5/13. Last dose warfarin PTA was on 5/2 and patient is s/p 8 mg total of Vitamin K during hospitalization. INR goal has been decreased to 2-2.5 per MD. Warfarin resumed 5/14.   INR today 2.3  Goal of Therapy:  INR 2-2.5 Monitor platelets by anticoagulation protocol: Yes   Plan:   Warfarin 2 mg x1 today at 1800  Daily PT/INR, CBC  Thank you Anette Guarneri, PharmD  Please utilize Amion for appropriate phone number to reach the unit pharmacist ([place]) 09/09/2018 2:23 AM

## 2018-09-16 NOTE — Progress Notes (Signed)
Physical Therapy Session Note  Patient Details  Name: [name] [name] MRN: [id number] Date of Birth: 12-11-1965  Today's Date: 09/16/2018 PT Individual Time: 1305-1401 PT Individual Time Calculation (min): 56 min   Short Term Goals: Week 2:  PT Short Term Goal 1 (Week 2): pt will tolerate semi upright position x 2 hours in w/c or recliner 5/7 days  PT Short Term Goal 2 (Week 2): pt will transfer with assist of 1, 50 % of trials PT Short Term Goal 3 (Week 2): pt wil move sit>< stand with +2 and tolerate x 30 seconds  Skilled Therapeutic Interventions/Progress Updates: Pt presented in bed agreeable to therapy with encouragement. Pt continues to complain of significant pain on L flank and abdomen. Pt agreeable to sit at EOB only. Pt performed rolling L/R with minA for shifting hips while brief re-taped and cream applied. PTA noted pt with residual stool on buttock, completed peri-care and cream applied. Pt performed supine to sit with use of bed features maxA and assistance for BLE management and truncal support. Pt able to maintain sitting EOB for approx 5 min while pt performed ankle pumps and LAQ to fatigue. Pt performed lateral scoot to R to set up[ when returning to bed requiring modA x 2. Pt then returning to bed requiring maxA x 2. Pt then participated in supine therex as follows:  Heel slides x 10 bilaterally SAQ 2 x10 bilaterally Modified bridge from bolster 2x5 Hip abd 2 x 10 bilaterally SLR 2 x 5 bilaterally UE Triceps extension with green resistance band x 15 bilaterally Biceps curls with green resistance band x 15 bilaterally horiz add with green resistance band to tolerance x 10 bilaterally  Pt notably more fatigued throughout session requiring encouragement to complete reps. Pt left in bed with call bell within reach and needs met.       Therapy Documentation Precautions:  Precautions Precautions: Fall Precaution Comments: pain along flanks and abdomen; ice as  needed Restrictions Weight Bearing Restrictions: No General: PT Amount of Missed Time (min): 19 Minutes PT Missed Treatment Reason: Patient fatigue(increased lethargy) Vital Signs:  Pain: Pain Assessment Pain Scale: 0-10 Pain Score: 9  Pain Type: Acute pain;Chronic pain Pain Location: Abdomen Pain Orientation: Right;Left Pain Descriptors / Indicators: Aching Pain Frequency: Intermittent Pain Onset: On-going Pain Intervention(s): Medication (See eMAR);Repositioned;Heat applied    Therapy/Group: Individual Therapy  Adasia Hoar  Shyloh Derosa, PTA  09/16/2018, 2:18 PM

## 2018-09-16 NOTE — Progress Notes (Signed)
Patient ID: [name] [name], male   DOB: Jan 05, 1966, 53 y.o.   MRN: [id number] S: no new complaints O:BP (!) 107/58   Pulse 93   Temp 98.2 F (36.8 C) (Oral)   Resp 18   Wt 119 kg   SpO2 95%   BMI 34.61 kg/m   Intake/Output Summary (Last 24 hours) at 09/16/2018 1247 Last data filed at 09/16/2018 0754 Gross per 24 hour  Intake 120 ml  Output -  Net 120 ml   Intake/Output: I/O last 3 completed shifts: In: 435 [P.O.:120; Blood:315] Out: 6294 [Other:1870]  Intake/Output this shift:  Total I/O In: 120 [P.O.:120] Out: -  Weight change: -3 kg Gen: NAD CVS: no rub Resp: cta Abd: diffuse subcutaneous nodules, tender to palpation Ext: no edema, LAVF +T/B  Recent Labs  Lab 09/11/18 1304 09/13/18 1348 09/14/18 0739  NA 134* 134* 137  K 4.3 4.2 3.5  CL 97* 94* 98  CO2 24 24 26   GLUCOSE 85 93 95  BUN 68* 54* 30*  CREATININE 8.42* 7.53* 4.95*  ALBUMIN 1.4* 1.4* 1.5*  CALCIUM 8.3* 8.5* 8.4*  PHOS 4.1 4.4 3.7   Liver Function Tests: Recent Labs  Lab 09/11/18 1304 09/13/18 1348 09/14/18 0739  ALBUMIN 1.4* 1.4* 1.5*   No results for input(s): LIPASE, AMYLASE in the last 168 hours. No results for input(s): AMMONIA in the last 168 hours. CBC: Recent Labs  Lab 09/12/18 0530 09/13/18 0604 09/14/18 0739 09/15/18 0715 09/16/18 0710  WBC 7.4 10.0 9.0 9.9 12.3*  HGB 7.5* 8.0* 7.0* 8.8* 9.6*  HCT 24.8* 26.2* 23.8* 28.6* 32.9*  MCV 83.5 83.2 84.4 83.4 88.2  PLT 283 354 311 325 273   Cardiac Enzymes: No results for input(s): CKTOTAL, CKMB, CKMBINDEX, TROPONINI in the last 168 hours. CBG: No results for input(s): GLUCAP in the last 168 hours.  Iron Studies: No results for input(s): IRON, TIBC, TRANSFERRIN, FERRITIN in the last 72 hours. Studies/Results: No results found. . sodium chloride   Intravenous Once  . acetaminophen  650 mg Oral Q6H  . amiodarone  200 mg Oral Daily  . atorvastatin  80 mg Oral q1800  . Chlorhexidine Gluconate Cloth  6 each Topical Q0600  .  cinacalcet  30 mg Oral Q supper  . darbepoetin (ARANESP) injection - DIALYSIS  100 mcg Intravenous Q Tue-HD  . feeding supplement (NEPRO CARB STEADY)  237 mL Oral BID BM  . feeding supplement (PRO-STAT SUGAR FREE 64)  30 mL Oral TID  . Gerhardt's butt cream   Topical BID  . lanthanum  1,000 mg Oral TID PC  . megestrol  400 mg Oral BID  . midodrine  15 mg Oral TID WC  . morphine  15 mg Oral Q12H  . multivitamin  1 tablet Oral QHS  . warfarin  2 mg Oral ONCE-1800  . Warfarin - Pharmacist Dosing Inpatient   Does not apply q1800    BMET    Component Value Date/Time   NA 137 09/14/2018 0739   NA 141 02/09/2018 1011   NA 137 11/13/2013 0408   K 3.5 09/14/2018 0739   K 4.1 11/13/2013 0408   CL 98 09/14/2018 0739   CL 98 11/13/2013 0408   CO2 26 09/14/2018 0739   CO2 25 11/13/2013 0408   GLUCOSE 95 09/14/2018 0739   GLUCOSE 64 (L) 11/13/2013 0408   BUN 30 (H) 09/14/2018 0739   BUN 40 (H) 02/09/2018 1011   BUN 68 (H) 11/13/2013 0408   CREATININE  4.95 (H) 09/14/2018 0739   CREATININE 16.45 (H) 11/13/2013 0408   CALCIUM 8.4 (L) 09/14/2018 0739   CALCIUM 6.8 (LL) 11/13/2013 0408   GFRNONAA 12 (L) 09/14/2018 0739   GFRNONAA 3 (L) 11/13/2013 0408   GFRAA 14 (L) 09/14/2018 0739   GFRAA 3 (L) 11/13/2013 0408   CBC    Component Value Date/Time   WBC 12.3 (H) 09/16/2018 0710   RBC 3.73 (L) 09/16/2018 0710   HGB 9.6 (L) 09/16/2018 0710   HGB 10.9 (L) 02/09/2018 1011   HCT 32.9 (L) 09/16/2018 0710   HCT 35.8 (L) 02/09/2018 1011   PLT 273 09/16/2018 0710   PLT 260 02/09/2018 1011   MCV 88.2 09/16/2018 0710   MCV 80 02/09/2018 1011   MCV 77 (L) 11/13/2013 0408   MCH 25.7 (L) 09/16/2018 0710   MCHC 29.2 (L) 09/16/2018 0710   RDW 18.5 (H) 09/16/2018 0710   RDW 15.9 (H) 02/09/2018 1011   RDW 17.1 (H) 11/13/2013 0408   LYMPHSABS 1.0 09/06/2018 1305   LYMPHSABS 1.0 02/09/2018 1011   LYMPHSABS 0.7 (L) 11/13/2013 0408   MONOABS 0.7 09/06/2018 1305   MONOABS 0.4 11/13/2013 0408    EOSABS 0.7 (H) 09/06/2018 1305   EOSABS 0.7 (H) 02/09/2018 1011   EOSABS 0.6 11/13/2013 0408   BASOSABS 0.1 09/06/2018 1305   BASOSABS 0.1 02/09/2018 1011   BASOSABS 0.1 11/13/2013 0408     Assessment/ Plan:  1. Left-sided perinephric hematoma- in setting of supratherapeutic INR s/p 7 units of PRBC's and s/p transcatheter embolization on 08/29/18. 1. Hgb dropped to 7 and transfused 2 units PRBC's with HD 09/14/18. 2. ESRDtolerating HD, cont with TTS schedule. Off schedule today due to anemia and need for blood transfusion. Will plan HD on Monday and then Thursday and Saturday to get back on schedule. 3. 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 1. S/pdeep tissue biopsy5/26/20and appreciate General Surgery's assistance, however bx without calciphylaxis. 2. Still suspicious and will continue with current treatment and consider repeat biopsy to another siteor deeper sample. 3. Continue with pain management and aggressive phos control 4. Anemia:ACD superimprosed with ABLA- on ESA and transfuse prn 5. CKD-MBD:non-calcium based binders, senispar, sodium thiosulfate for probablecalciphylaxis 6. Severe protein malnutrition- recommend supplements 7. Nutrition:renal diet 8. Hypertension:stable/chronic hypotension on midodrine 9. AVR- on coumadin 10. Atrial fibrillation- on amio and anticoagulation  Donetta Potts, MD [facility] [phone]

## 2018-09-16 NOTE — Progress Notes (Signed)
PHYSICAL MEDICINE & REHABILITATION PROGRESS NOTE   Subjective/Complaints: Patient seen laying in bed this morning.  He states he slept fairly overnight.  He denies complaints.  He was seen by nephrology yesterday, notes reviewed.  ROS: Denies CP, SOB, N/V/D  Objective:   [place] Chest Soft Tissue  Result Date: 09/14/2018 CLINICAL DATA:  Painful left chest wall mass. EXAM: ULTRASOUND OF CHEST SOFT TISSUES TECHNIQUE: Ultrasound examination of the chest wall soft tissues was performed in the area of clinical concern. COMPARISON:  CT abdomen pelvis dated July 23, 2018. CT chest dated January 13, 2018. FINDINGS: Focused ultrasound in the area of clinical concern over the left lateral chest wall demonstrates ill-defined 6.3 x 2.9 cm complex fluid. There is no discrete soft tissue mass. IMPRESSION: 1. Ill-defined 6.3 x 2.9 cm complex fluid in the left lateral chest wall. This correlates with an area of ill-defined hyperdensity seen on recent CTs, and is at the site of prior left chest wall pacemaker based on chest CT from September 2019. 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 2. No discrete soft tissue mass. Electronically Signed   By: Titus Dubin M.D.   On: 09/14/2018 13:10   Recent Labs    09/15/18 0715 09/16/18 0710  WBC 9.9 12.3*  HGB 8.8* 9.6*  HCT 28.6* 32.9*  PLT 325 273   Recent Labs    09/13/18 1348 09/14/18 0739  NA 134* 137  K 4.2 3.5  CL 94* 98  CO2 24 26  GLUCOSE 93 95  BUN 54* 30*  CREATININE 7.53* 4.95*  CALCIUM 8.5* 8.4*    Intake/Output Summary (Last 24 hours) at 09/16/2018 0956 Last data filed at 09/16/2018 0754 Gross per 24 hour  Intake 120 ml  Output -  Net 120 ml     Physical Exam: Vital Signs Blood pressure (!) 107/58, pulse 93, temperature 98.2 F (36.8 C), temperature source Oral, resp. rate 18, weight 119 kg, SpO2 95 %. Constitutional: No distress . Vital signs reviewed. HENT: Normocephalic.  Atraumatic. Eyes:  EOMI.  No discharge. Cardiovascular: No JVD. Respiratory: Normal effort. GI: Non-distended. Musc: Bilateral lower extremity edema Neurological: Alert and oriented Motor:   Bilateral lower extremities: Grossly 4-4+/5 proximal to distal Skin: Skin iswarmand dry.  Multiple 1 to 2 cm subcutaneous soft nodules along abdomen, not examined today. Psychiatric: Normal mood.  Normal affect.   Assessment/Plan: 1. Functional deficits secondary to debility which require 3+ hours per day of interdisciplinary therapy in a comprehensive inpatient rehab setting.  Physiatrist is providing close team supervision and 24 hour management of active medical problems listed below.  Physiatrist and rehab team continue to assess barriers to discharge/monitor patient progress toward functional and medical goals  Care Tool:  Bathing    Body parts bathed by patient: Right arm, Left arm, Chest, Abdomen, Front perineal area, Face   Body parts bathed by helper: Buttocks, Right upper leg, Left upper leg, Right lower leg, Left lower leg     Bathing assist Assist Level: Moderate Assistance - Patient 50 - 74%     Upper Body Dressing/Undressing Upper body dressing   What is the patient wearing?: Pull over shirt    Upper body assist Assist Level: Contact Guard/Touching assist    Lower Body Dressing/Undressing Lower body dressing      What is the patient wearing?: Pants     Lower body assist Assist for lower body dressing: Maximal Assistance - Patient 25 - 49%     Toileting  Toileting Toileting Activity did not occur Landscape architect and hygiene only): N/A (no void or bm)(HD pt)  Toileting assist Assist for toileting: Dependent - Patient 0%     Transfers Chair/bed transfer  Transfers assist  Chair/bed transfer activity did not occur: Safety/medical concerns  Chair/bed transfer assist level: 2 Helpers     Locomotion Ambulation   Ambulation assist   Ambulation activity did not occur:  Safety/medical concerns          Walk 10 feet activity   Assist  Walk 10 feet activity did not occur: Safety/medical concerns        Walk 50 feet activity   Assist Walk 50 feet with 2 turns activity did not occur: Safety/medical concerns         Walk 150 feet activity   Assist Walk 150 feet activity did not occur: Safety/medical concerns         Walk 10 feet on uneven surface  activity   Assist Walk 10 feet on uneven surfaces activity did not occur: Safety/medical concerns         Wheelchair     Assist     Wheelchair activity did not occur: Refused         Wheelchair 50 feet with 2 turns activity    Assist    Wheelchair 50 feet with 2 turns activity did not occur: Refused       Wheelchair 150 feet activity     Assist Wheelchair 150 feet activity did not occur: Refused        Medical Problem List and Plan: 1.Functional and mobility deficitssecondary to debility after multiple medical issues  -recent hx of bleeding peri-nephric cyst complicated by elevated INR Continue CIR 2. Antithrombotics: -DVT/anticoagulation:Pharmaceutical:Coumadin INR therapeutic on 5/31 -antiplatelet therapy: N/A 3. Pain Management:  PRN oxycodone-   -continue MS Contin 15mg  q12 .  Ice, heat prn to abdomen/flank.   Relatively controlled on 5/31 4. Mood:LCSW to follow for evaluation and support. -antipsychotic agents: N/A 5. Neuropsych: This patientiscapable of making decisions on his own behalf. 6. Skin/Wound Care:Routine pressure relief measures.   -Abdominal nodules suspicious for calciphylaxis  -nephrology has added sensipar and sodium thiosulfate with HD  -surgical bx revealed normal tissue however. Re-bx? 7. Fluids/Electrolytes/Nutrition:Strict I/O with daily weights. Continue to offer nephro supplements and pro-stat.  -appreciate RD input  -continue megace trial  8. Anemia of chronic  disease: Received Feraheme 5/18 and on Aranesp weekly. .  Hemoglobin 9.6 on 5/31, after transfusion on 5/29 9. CAD s/p AVR/ICM s/p HWT:UUEKCMK HR BID--continue amiodarone daily. On coumadin 10. Polycystic kidney disease/ESRD:HD scheduled at end of the day to allow full participation in therapies 11. Chronic hypotension: On midodrine tid  Labile on 5/31, but asymptomatic  LOS: 12 days A FACE TO FACE EVALUATION WAS PERFORMED   Lorie Phenix 09/16/2018, 9:56 AM

## 2018-09-17 ENCOUNTER — Inpatient Hospital Stay (HOSPITAL_COMMUNITY): Payer: Medicare Other

## 2018-09-17 ENCOUNTER — Inpatient Hospital Stay (HOSPITAL_COMMUNITY): Payer: Medicare Other | Admitting: *Deleted

## 2018-09-17 LAB — CBC
HCT: 35.2 % — ABNORMAL LOW (ref 39.0–52.0)
Hemoglobin: 10.2 g/dL — ABNORMAL LOW (ref 13.0–17.0)
MCH: 25.3 pg — ABNORMAL LOW (ref 26.0–34.0)
MCHC: 29 g/dL — ABNORMAL LOW (ref 30.0–36.0)
MCV: 87.3 fL (ref 80.0–100.0)
Platelets: 449 10*3/uL — ABNORMAL HIGH (ref 150–400)
RBC: 4.03 MIL/uL — ABNORMAL LOW (ref 4.22–5.81)
RDW: 18.6 % — ABNORMAL HIGH (ref 11.5–15.5)
WBC: 16.9 10*3/uL — ABNORMAL HIGH (ref 4.0–10.5)
nRBC: 0 % (ref 0.0–0.2)

## 2018-09-17 LAB — PROTIME-INR
INR: 3 — ABNORMAL HIGH (ref 0.8–1.2)
Prothrombin Time: 30.4 seconds — ABNORMAL HIGH (ref 11.4–15.2)

## 2018-09-17 MED ORDER — OXYCODONE HCL 5 MG PO TABS
ORAL_TABLET | ORAL | Status: AC
  Filled 2018-09-17: qty 2

## 2018-09-17 MED ORDER — MORPHINE SULFATE ER 15 MG PO TBCR
15.0000 mg | EXTENDED_RELEASE_TABLET | ORAL | Status: AC
  Administered 2018-09-17: 15 mg via ORAL
  Filled 2018-09-17: qty 1

## 2018-09-17 MED ORDER — DARBEPOETIN ALFA 100 MCG/0.5ML IJ SOSY: 100.0000 ug | PREFILLED_SYRINGE | INTRAMUSCULAR | Status: DC

## 2018-09-17 MED ORDER — MORPHINE SULFATE ER 15 MG PO TBCR
30.0000 mg | EXTENDED_RELEASE_TABLET | Freq: Two times a day (BID) | ORAL | Status: DC
  Administered 2018-09-17 – 2018-09-18 (×2): 30 mg via ORAL
  Filled 2018-09-17 (×2): qty 2

## 2018-09-17 MED ORDER — SODIUM THIOSULFATE 25 % IV SOLN
25.0000 g | INTRAVENOUS | Status: AC
  Administered 2018-09-17: 25 g via INTRAVENOUS
  Filled 2018-09-17 (×2): qty 100

## 2018-09-17 NOTE — Progress Notes (Addendum)
Physical Therapy Session Note  Patient Details  Name: [name] [name] MRN: [id number] Date of Birth: 18-Mar-1966  Today's Date: 09/17/2018 PT Individual Time: 0800-0900 , 1 hr and 1420-1500, 40 min  PT Individual Time Calculation (min): 60 min   Short Term Goals:  Week 2:  PT Short Term Goal 1 (Week 2): pt will tolerate semi upright position x 2 hours in w/c or recliner 5/7 days  PT Short Term Goal 2 (Week 2): pt will transfer with assist of 1, 50 % of trials PT Short Term Goal 3 (Week 2): pt wil move sit>< stand with +2 and tolerate x 30 seconds      Skilled Therapeutic Interventions/Progress Updates:   tx 1:  Pt resting in bed.  Pt stated his pain was "out there", unrated.  Pt received pain meds.  Given BP, pt willing to get OOB to recliner.  PT threaded pants onto LEs. Pt assisted pulling pants up partway over hips, completed by PT.  Pt rolled partially to assist with shorts.  Pt SOB after donning shorts and needed rest break before attempting to move.  Using bed features and max assist, supine into partial sitting for a couple of minutes.  Pt c/o nausea but did not vomit.  Slide board transfer with mod/max assist of 1, and guarding for safety by another.  Recliner with Roho cushion in seat and one on back.  Initially, pt wanted to be reclined back as far as possibl and bil LEs elevated, but he tolerated this only 20 minutes.  Pt slightly more comfortable sitting up with feet on floor.  BP 85/60, HR123, SOB.  Pt unable to tolerate further time in recliner. Slide board transfer as above; 2nd person did stabilize board.  Pt attempts to roll into bed unsafely before hips are completely on bed.  At end of session, pt left resting in bed with alarm set and needs at hand.  tx 2:  Pt resting in bed; he rated pain 9/10 L flank and L lower abdomen, medicated.  Therapeutic exercise performed with LE to increase strength for functional mobility: supine-  12 x 1 each bil hip adductor squeezes,  pelvic tilts with stabilization by PT of LEs in hook lying, R/L shoulder protraction with elbow stabilization by PT  20 x 1 each: bil ankle pumps,  cervical flexion 5 x 2 assisted straight leg raises R/L  Rolling in partial side lying R/L with min assist, limited by pain.  Pt scooted slightly side to side with supervision and extra time  Partial side lying: 12 x 1R/L hip abduction with flexed knees and hips.  At end of session, pt left resting in bed with alarm set and needs at hand.     Therapy Documentation Precautions:  Precautions Precautions: Fall Precaution Comments: pain along flanks and abdomen; ice as needed Restrictions Weight Bearing Restrictions: No   Vital Signs: Therapy Vitals Pulse Rate: (!) 103 BP: 118/67 Patient Position (if appropriate): Lying,  AM session Pain: Pain Assessment Pain Scale: 0-10 Pain Score: 10 Pain Type: Acute pain Back and L flank Pain Intervention(s): Medication (See eMAR); respoitioned.       Therapy/Group: Individual Therapy  Persephonie Hegwood 09/17/2018, 10:19 AM

## 2018-09-17 NOTE — Progress Notes (Signed)
Occupational Therapy Session Note  Patient Details  Name: [name] [name] MRN: [id number] Date of Birth: [birth year]  Today's Date: 09/17/2018 OT Individual Time: 1000-1055 OT Individual Time Calculation (min): 55 min    Short Term Goals: Week 2:  OT Short Term Goal 1 (Week 2): Pt will tolerate sitting upright out of bed for 3 hrs a day. OT Short Term Goal 2 (Week 2): Pt will perform sit to stand with mod A +1 for LB dressing  OT Short Term Goal 3 (Week 2): Pt will perform lateral transfers (scoot or squat) with min A  OT Short Term Goal 4 (Week 2): Pt will LB dressing with mod A (sit to stand).  Skilled Therapeutic Interventions/Progress Updates:    Pt resting in bed upon arrival.  Pt declined OOB activities but agreeable to bed level activities.  Pt OOB in earlier PT session but only able to tolerate approx 20 mins OOB 2/2 pain.  Pt engaged in roling R<>L with min A and use of bed rails.  Pt able to reposition in bed with min A.  Pt fatgiues quickly.  Pt engaged in BUE therex with 2kg ball, 5# bar, and 1# bar to use to "bat" beach ball to therapist.  Pt completed chest presses, overhead presses, biceps curls 3X10 with extended rest breaks between each set. Pt remained in bed with all needs within reach and bed alarm activated.   Therapy Documentation Precautions:  Precautions Precautions: Fall Precaution Comments: pain along flanks and abdomen; ice as needed Restrictions Weight Bearing Restrictions: No Pain: Pain Assessment Pain Scale: 0-10 Pain Score: 8; abdomen and back; repositioned  Therapy/Group: Individual Therapy  [name] 09/17/2018, 11:00 AM

## 2018-09-17 NOTE — Progress Notes (Signed)
Patient ID: [name] [name], male   DOB: 02-Apr-1966, 53 y.o.   MRN: [id number] S: no new complaints , per nursing developing some skin breakdown- working with PT at present    O:BP (!) 115/36 (BP Location: Right Arm)   Pulse 99   Temp 97.7 F (36.5 C) (Oral)   Resp 16   Wt 119 kg   SpO2 98%   BMI 34.61 kg/m   Intake/Output Summary (Last 24 hours) at 09/17/2018 1438 Last data filed at 09/17/2018 1405 Gross per 24 hour  Intake 270 ml  Output -  Net 270 ml   Intake/Output: I/O last 3 completed shifts: In: 360 [P.O.:360] Out: -   Intake/Output this shift:  Total I/O In: 150 [P.O.:150] Out: -  Weight change:  Gen: NAD CVS: no rub Resp: cta Abd: diffuse subcutaneous nodules, tender to palpation Ext: no edema, LAVF +T/B  Recent Labs  Lab 09/11/18 1304 09/13/18 1348 09/14/18 0739  NA 134* 134* 137  K 4.3 4.2 3.5  CL 97* 94* 98  CO2 24 24 26   GLUCOSE 85 93 95  BUN 68* 54* 30*  CREATININE 8.42* 7.53* 4.95*  ALBUMIN 1.4* 1.4* 1.5*  CALCIUM 8.3* 8.5* 8.4*  PHOS 4.1 4.4 3.7   Liver Function Tests: Recent Labs  Lab 09/11/18 1304 09/13/18 1348 09/14/18 0739  ALBUMIN 1.4* 1.4* 1.5*   No results for input(s): LIPASE, AMYLASE in the last 168 hours. No results for input(s): AMMONIA in the last 168 hours. CBC: Recent Labs  Lab 09/13/18 0604 09/14/18 0739 09/15/18 0715 09/16/18 0710 09/17/18 0837  WBC 10.0 9.0 9.9 12.3* 16.9*  HGB 8.0* 7.0* 8.8* 9.6* 10.2*  HCT 26.2* 23.8* 28.6* 32.9* 35.2*  MCV 83.2 84.4 83.4 88.2 87.3  PLT 354 311 325 273 449*   Cardiac Enzymes: No results for input(s): CKTOTAL, CKMB, CKMBINDEX, TROPONINI in the last 168 hours. CBG: No results for input(s): GLUCAP in the last 168 hours.  Iron Studies: No results for input(s): IRON, TIBC, TRANSFERRIN, FERRITIN in the last 72 hours. Studies/Results: No results found. . sodium chloride   Intravenous Once  . acetaminophen  650 mg Oral Q6H  . amiodarone  200 mg Oral Daily  . atorvastatin  80 mg  Oral q1800  . Chlorhexidine Gluconate Cloth  6 each Topical Q0600  . cinacalcet  30 mg Oral Q supper  . darbepoetin (ARANESP) injection - DIALYSIS  100 mcg Intravenous Q Tue-HD  . feeding supplement (NEPRO CARB STEADY)  237 mL Oral BID BM  . feeding supplement (PRO-STAT SUGAR FREE 64)  30 mL Oral TID  . Gerhardt's butt cream   Topical BID  . lanthanum  1,000 mg Oral TID PC  . megestrol  400 mg Oral BID  . midodrine  15 mg Oral TID WC  . morphine  30 mg Oral Q12H  . multivitamin  1 tablet Oral QHS  . Warfarin - Pharmacist Dosing Inpatient   Does not apply q1800    BMET    Component Value Date/Time   NA 137 09/14/2018 0739   NA 141 02/09/2018 1011   NA 137 11/13/2013 0408   K 3.5 09/14/2018 0739   K 4.1 11/13/2013 0408   CL 98 09/14/2018 0739   CL 98 11/13/2013 0408   CO2 26 09/14/2018 0739   CO2 25 11/13/2013 0408   GLUCOSE 95 09/14/2018 0739   GLUCOSE 64 (L) 11/13/2013 0408   BUN 30 (H) 09/14/2018 0739   BUN 40 (H) 02/09/2018 1011  BUN 68 (H) 11/13/2013 0408   CREATININE 4.95 (H) 09/14/2018 0739   CREATININE 16.45 (H) 11/13/2013 0408   CALCIUM 8.4 (L) 09/14/2018 0739   CALCIUM 6.8 (LL) 11/13/2013 0408   GFRNONAA 12 (L) 09/14/2018 0739   GFRNONAA 3 (L) 11/13/2013 0408   GFRAA 14 (L) 09/14/2018 0739   GFRAA 3 (L) 11/13/2013 0408   CBC    Component Value Date/Time   WBC 16.9 (H) 09/17/2018 0837   RBC 4.03 (L) 09/17/2018 0837   HGB 10.2 (L) 09/17/2018 0837   HGB 10.9 (L) 02/09/2018 1011   HCT 35.2 (L) 09/17/2018 0837   HCT 35.8 (L) 02/09/2018 1011   PLT 449 (H) 09/17/2018 0837   PLT 260 02/09/2018 1011   MCV 87.3 09/17/2018 0837   MCV 80 02/09/2018 1011   MCV 77 (L) 11/13/2013 0408   MCH 25.3 (L) 09/17/2018 0837   MCHC 29.0 (L) 09/17/2018 0837   RDW 18.6 (H) 09/17/2018 0837   RDW 15.9 (H) 02/09/2018 1011   RDW 17.1 (H) 11/13/2013 0408   LYMPHSABS 1.0 09/06/2018 1305   LYMPHSABS 1.0 02/09/2018 1011   LYMPHSABS 0.7 (L) 11/13/2013 0408   MONOABS 0.7 09/06/2018  1305   MONOABS 0.4 11/13/2013 0408   EOSABS 0.7 (H) 09/06/2018 1305   EOSABS 0.7 (H) 02/09/2018 1011   EOSABS 0.6 11/13/2013 0408   BASOSABS 0.1 09/06/2018 1305   BASOSABS 0.1 02/09/2018 1011   BASOSABS 0.1 11/13/2013 0408     Assessment/ Plan:  1. Left-sided perinephric hematoma- in setting of supratherapeutic INR s/p 7 units of PRBC's and s/p transcatheter embolization on 08/29/18. 1. Hgb dropped to 7 and transfused 2 units PRBC's with HD 09/14/18. 2. ESRDtolerating HD, cont with TTS schedule.  Normally [facility] Off schedule Friday due to anemia and need for blood transfusion. Planning on HD Monday and then Thursday to get back on schedule. 3. 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 1. S/pdeep tissue biopsy5/26/20and appreciate General Surgery's assistance, bx without calciphylaxis. 2. Still suspicious and will continue with current treatment and consider repeat biopsy to another siteor deeper sample. 3. Continue with pain management and aggressive phos control 4. Anemia:ACD superimprosed with ABLA- on ESA(100 q Tuesday) and transfuse prn- last 5/29- hgb 10.2 today  5. CKD-MBD:non-calcium based binders (fosrenol) , senispar, sodium thiosulfate for probablecalciphylaxis 6. Severe protein malnutrition- supplements 7. Nutrition:renal diet 8. Hypertension:stable/chronic hypotension on midodrine 9. AVR- on coumadin 10. Atrial fibrillation- on amio and anticoagulation  Louis Meckel

## 2018-09-17 NOTE — Progress Notes (Signed)
[place] PHYSICAL MEDICINE & REHABILITATION PROGRESS NOTE   Subjective/Complaints: Pt in bed. No real new issues. Abdomen still tender. Feels that morphine makes him a little sleepy but that he can handle. It.   ROS: Patient denies fever, rash, sore throat, blurred vision, nausea, vomiting, diarrhea, cough, shortness of breath or chest pain  headache, or mood change.   Objective:   No results found. Recent Labs    09/16/18 0710 09/17/18 0837  WBC 12.3* 16.9*  HGB 9.6* 10.2*  HCT 32.9* 35.2*  PLT 273 449*   No results for input(s): NA, K, CL, CO2, GLUCOSE, BUN, CREATININE, CALCIUM in the last 72 hours.  Intake/Output Summary (Last 24 hours) at 09/17/2018 1031 Last data filed at 09/17/2018 0835 Gross per 24 hour  Intake 340 ml  Output -  Net 340 ml     Physical Exam: Vital Signs Blood pressure 118/67, pulse (!) 103, temperature 98 F (36.7 C), temperature source Oral, resp. rate 18, weight 119 kg, SpO2 99 %. Constitutional: No distress . Vital signs reviewed. HEENT: EOMI, oral membranes moist Neck: supple Cardiovascular: RRR without murmur. No JVD    Respiratory: CTA Bilaterally without wheezes or rales. Normal effort    GI: BS +, non-tender, non-distended  Musc: Bilateral lower extremity edema, tender trunk and abdomen Neurological: Alert and oriented Motor:   Bilateral lower extremities: Grossly 3/5 LE, 4/5 UE Skin: Skin iswarmand dry.  Multiple 1 to 2 cm subcutaneous soft nodules along abdomen, ongoing, still tender. Psychiatric: Normal mood.  Normal affect.   Assessment/Plan: 1. Functional deficits secondary to debility which require 3+ hours per day of interdisciplinary therapy in a comprehensive inpatient rehab setting.  Physiatrist is providing close team supervision and 24 hour management of active medical problems listed below.  Physiatrist and rehab team continue to assess barriers to discharge/monitor patient progress toward functional and medical  goals  Care Tool:  Bathing    Body parts bathed by patient: Right arm, Left arm, Chest, Abdomen, Front perineal area, Face   Body parts bathed by helper: Buttocks, Right upper leg, Left upper leg, Right lower leg, Left lower leg     Bathing assist Assist Level: Moderate Assistance - Patient 50 - 74%     Upper Body Dressing/Undressing Upper body dressing   What is the patient wearing?: Pull over shirt    Upper body assist Assist Level: Contact Guard/Touching assist    Lower Body Dressing/Undressing Lower body dressing      What is the patient wearing?: Pants     Lower body assist Assist for lower body dressing: Maximal Assistance - Patient 25 - 49%     Toileting Toileting Toileting Activity did not occur (Clothing management and hygiene only): N/A (no void or bm)(HD pt)  Toileting assist Assist for toileting: Dependent - Patient 0%     Transfers Chair/bed transfer  Transfers assist  Chair/bed transfer activity did not occur: Safety/medical concerns  Chair/bed transfer assist level: 2 Helpers     Locomotion Ambulation   Ambulation assist   Ambulation activity did not occur: Safety/medical concerns          Walk 10 feet activity   Assist  Walk 10 feet activity did not occur: Safety/medical concerns        Walk 50 feet activity   Assist Walk 50 feet with 2 turns activity did not occur: Safety/medical concerns         Walk 150 feet activity   Assist Walk 150 feet activity did  not occur: Safety/medical concerns         Walk 10 feet on uneven surface  activity   Assist Walk 10 feet on uneven surfaces activity did not occur: Safety/medical Armed forces technical officer activity did not occur: Refused         Wheelchair 50 feet with 2 turns activity    Assist    Wheelchair 50 feet with 2 turns activity did not occur: Refused       Wheelchair 150 feet activity     Assist Wheelchair  150 feet activity did not occur: Refused        Medical Problem List and Plan: 1.Functional and mobility deficitssecondary to debility after multiple medical issues  -recent hx of bleeding peri-nephric cyst complicated by elevated INR Continue CIR  -will likely need to pursue SNF placement as I'm not sure his activity tolerance is going to pick up substantially in the near future.  2. Antithrombotics: -DVT/anticoagulation:Pharmaceutical:Coumadin INR therapeutic on 5/31 -antiplatelet therapy: N/A 3. Pain Management:  PRN oxycodone-   -pt wants to increase ms contin to 30mg  q12. Will do so but observe mental status closely.  .  Ice, heat prn to abdomen/flank.   Relatively controlled on 5/31 4. Mood:LCSW to follow for evaluation and support. -antipsychotic agents: N/A 5. Neuropsych: This patientiscapable of making decisions on his own behalf. 6. Skin/Wound Care:Routine pressure relief measures.   -Abdominal nodules suspicious for calciphylaxis  -nephrology has added sensipar and sodium thiosulfate with HD  -surgical bx revealed normal tissue however. Re-bx?  -unfortunately no "quick fix here' 7. Fluids/Electrolytes/Nutrition:Strict I/O with daily weights. Continue to offer nephro supplements and pro-stat.  -intake not optimal but improved  -continue megace trial  8. Anemia of chronic disease: Received Feraheme 5/18 and on Aranesp weekly. .  Hemoglobin 9.6 on 5/31, after transfusion on 5/29 9. CAD s/p AVR/ICM s/p VAP:OLIDCVU HR BID--continue amiodarone daily. On coumadin 10. Polycystic kidney disease/ESRD:HD scheduled at end of the day to allow full participation in therapies 11. Chronic hypotension: On midodrine tid  Worse after HD  LOS: 13 days A FACE TO FACE EVALUATION WAS PERFORMED  Meredith Staggers 09/17/2018, 10:31 AM

## 2018-09-17 NOTE — Progress Notes (Signed)
[place] for warfarin Indication: Mechanical valve  No Known Allergies  Patient Measurements: Weight: 262 lb 5.6 oz (119 kg)  Vital Signs: Temp: 97.7 F (36.5 C) (06/01 1304) Temp Source: Oral (06/01 1304) BP: 115/36 (06/01 1304) Pulse Rate: 99 (06/01 1304)  Labs: Recent Labs    09/15/18 0715 09/16/18 0710 09/16/18 0844 09/17/18 0837  HGB 8.8* 9.6*  --  10.2*  HCT 28.6* 32.9*  --  35.2*  PLT 325 273  --  449*  LABPROT 24.9*  --  25.2* 30.4*  INR 2.3*  --  2.3* 3.0*   Estimated Creatinine Clearance: 23.3 mL/min (A) (by C-G formula based on SCr of 4.95 mg/dL (H)).  Assessment: 65 YOM on warfarin PTA for mech aortic valve. Recent admission 4/15-4/21 forsupratherapeutic INR andruptured hemorrhagic renal cyst. Readmitted 5/3 with abdominal pain, gross hematuria, and supratherapeutic INR. CT abdomen at OSH showed increase in size of perinephric hematoma. Patient s/p embolization on 5/13. Last dose warfarin PTA was on 5/2 and patient is s/p 8 mg total of Vitamin K during hospitalization. INR goal has been decreased to 2-2.5 per MD. Warfarin resumed 5/14.   Today, 09/17/18: INR = 3, now supratherapeutic  CBC: Hgb improved to 10.2, Pltc elevated  Goal of Therapy:  INR 2-2.5 Monitor platelets by anticoagulation protocol: Yes   Plan:   Hold warfarin today  Daily PT/INR  Follow CBC and for s/sx of bleeding    Lindell Spar, PharmD, BCPS Clinical Pharmacist 09/17/2018 3:38 PM  Please utilize Amion for appropriate phone number to reach the unit pharmacist ([place])

## 2018-09-17 NOTE — Progress Notes (Signed)
Occupational Therapy Note  Patient Details  Name: [name] [name] MRN: [id number] Date of Birth: 03-Sep-1965  Today's Date: 09/17/2018 OT Missed Time: 31 Minutes Missed Time Reason: Nursing care  Pt missed 45 mins skilled OT services 2/2 nursing are.   Leotis Shames [facility] 09/17/2018, 1:34 PM

## 2018-09-17 NOTE — Plan of Care (Signed)
  Problem: RH BOWEL ELIMINATION Goal: RH STG MANAGE BOWEL WITH ASSISTANCE Description STG Manage Bowel with min Assistance.   Outcome: Progressing   Problem: RH SKIN INTEGRITY Goal: RH STG SKIN FREE OF INFECTION/BREAKDOWN Description Assess skin, inspect groin for MASD and treat with min assist    Outcome: Progressing

## 2018-09-18 ENCOUNTER — Inpatient Hospital Stay (HOSPITAL_COMMUNITY): Payer: Medicare Other

## 2018-09-18 ENCOUNTER — Inpatient Hospital Stay (HOSPITAL_COMMUNITY): Payer: Medicare Other | Admitting: Occupational Therapy

## 2018-09-18 ENCOUNTER — Inpatient Hospital Stay (HOSPITAL_COMMUNITY): Payer: Medicare Other | Admitting: Certified Registered"

## 2018-09-18 ENCOUNTER — Encounter (HOSPITAL_COMMUNITY): Payer: Self-pay | Admitting: Primary Care

## 2018-09-18 ENCOUNTER — Inpatient Hospital Stay (HOSPITAL_COMMUNITY)
Admission: AD | Admit: 2018-09-18 | Discharge: 2018-10-17 | DRG: 308 | Disposition: E | Payer: Medicare Other | Source: Ambulatory Visit | Attending: Internal Medicine | Admitting: Internal Medicine

## 2018-09-18 DIAGNOSIS — I132 Hypertensive heart and chronic kidney disease with heart failure and with stage 5 chronic kidney disease, or end stage renal disease: Secondary | ICD-10-CM | POA: Diagnosis present

## 2018-09-18 DIAGNOSIS — Z4682 Encounter for fitting and adjustment of non-vascular catheter: Secondary | ICD-10-CM | POA: Diagnosis not present

## 2018-09-18 DIAGNOSIS — N2889 Other specified disorders of kidney and ureter: Secondary | ICD-10-CM | POA: Diagnosis present

## 2018-09-18 DIAGNOSIS — G931 Anoxic brain damage, not elsewhere classified: Secondary | ICD-10-CM | POA: Diagnosis present

## 2018-09-18 DIAGNOSIS — Z952 Presence of prosthetic heart valve: Secondary | ICD-10-CM | POA: Diagnosis not present

## 2018-09-18 DIAGNOSIS — Z6838 Body mass index (BMI) 38.0-38.9, adult: Secondary | ICD-10-CM | POA: Diagnosis not present

## 2018-09-18 DIAGNOSIS — R1084 Generalized abdominal pain: Secondary | ICD-10-CM | POA: Diagnosis not present

## 2018-09-18 DIAGNOSIS — N186 End stage renal disease: Secondary | ICD-10-CM | POA: Diagnosis present

## 2018-09-18 DIAGNOSIS — I2510 Atherosclerotic heart disease of native coronary artery without angina pectoris: Secondary | ICD-10-CM | POA: Diagnosis present

## 2018-09-18 DIAGNOSIS — Z515 Encounter for palliative care: Secondary | ICD-10-CM | POA: Diagnosis not present

## 2018-09-18 DIAGNOSIS — J811 Chronic pulmonary edema: Secondary | ICD-10-CM | POA: Diagnosis not present

## 2018-09-18 DIAGNOSIS — Q613 Polycystic kidney, unspecified: Secondary | ICD-10-CM | POA: Diagnosis not present

## 2018-09-18 DIAGNOSIS — R570 Cardiogenic shock: Secondary | ICD-10-CM | POA: Diagnosis not present

## 2018-09-18 DIAGNOSIS — Z66 Do not resuscitate: Secondary | ICD-10-CM | POA: Diagnosis not present

## 2018-09-18 DIAGNOSIS — D638 Anemia in other chronic diseases classified elsewhere: Secondary | ICD-10-CM | POA: Diagnosis not present

## 2018-09-18 DIAGNOSIS — I462 Cardiac arrest due to underlying cardiac condition: Secondary | ICD-10-CM | POA: Diagnosis present

## 2018-09-18 DIAGNOSIS — Z7901 Long term (current) use of anticoagulants: Secondary | ICD-10-CM

## 2018-09-18 DIAGNOSIS — E872 Acidosis: Secondary | ICD-10-CM | POA: Diagnosis present

## 2018-09-18 DIAGNOSIS — D649 Anemia, unspecified: Secondary | ICD-10-CM | POA: Diagnosis present

## 2018-09-18 DIAGNOSIS — J9600 Acute respiratory failure, unspecified whether with hypoxia or hypercapnia: Secondary | ICD-10-CM | POA: Diagnosis not present

## 2018-09-18 DIAGNOSIS — I469 Cardiac arrest, cause unspecified: Secondary | ICD-10-CM | POA: Diagnosis present

## 2018-09-18 DIAGNOSIS — R5381 Other malaise: Secondary | ICD-10-CM | POA: Diagnosis not present

## 2018-09-18 DIAGNOSIS — Z951 Presence of aortocoronary bypass graft: Secondary | ICD-10-CM | POA: Diagnosis not present

## 2018-09-18 DIAGNOSIS — Z992 Dependence on renal dialysis: Secondary | ICD-10-CM | POA: Diagnosis not present

## 2018-09-18 DIAGNOSIS — I472 Ventricular tachycardia: Principal | ICD-10-CM | POA: Diagnosis present

## 2018-09-18 DIAGNOSIS — J9601 Acute respiratory failure with hypoxia: Secondary | ICD-10-CM | POA: Diagnosis present

## 2018-09-18 DIAGNOSIS — I5042 Chronic combined systolic (congestive) and diastolic (congestive) heart failure: Secondary | ICD-10-CM | POA: Diagnosis present

## 2018-09-18 DIAGNOSIS — J690 Pneumonitis due to inhalation of food and vomit: Secondary | ICD-10-CM | POA: Diagnosis present

## 2018-09-18 DIAGNOSIS — I4891 Unspecified atrial fibrillation: Secondary | ICD-10-CM | POA: Diagnosis present

## 2018-09-18 DIAGNOSIS — Z978 Presence of other specified devices: Secondary | ICD-10-CM

## 2018-09-18 DIAGNOSIS — Z01818 Encounter for other preprocedural examination: Secondary | ICD-10-CM

## 2018-09-18 DIAGNOSIS — N2581 Secondary hyperparathyroidism of renal origin: Secondary | ICD-10-CM | POA: Diagnosis not present

## 2018-09-18 DIAGNOSIS — Z7189 Other specified counseling: Secondary | ICD-10-CM

## 2018-09-18 DIAGNOSIS — L89893 Pressure ulcer of other site, stage 3: Secondary | ICD-10-CM | POA: Diagnosis not present

## 2018-09-18 DIAGNOSIS — I120 Hypertensive chronic kidney disease with stage 5 chronic kidney disease or end stage renal disease: Secondary | ICD-10-CM | POA: Diagnosis not present

## 2018-09-18 DIAGNOSIS — D631 Anemia in chronic kidney disease: Secondary | ICD-10-CM | POA: Diagnosis not present

## 2018-09-18 DIAGNOSIS — I953 Hypotension of hemodialysis: Secondary | ICD-10-CM | POA: Diagnosis not present

## 2018-09-18 DIAGNOSIS — E43 Unspecified severe protein-calorie malnutrition: Secondary | ICD-10-CM | POA: Diagnosis not present

## 2018-09-18 LAB — POCT I-STAT 7, (LYTES, BLD GAS, ICA,H+H)
Acid-base deficit: 5 mmol/L — ABNORMAL HIGH (ref 0.0–2.0)
Acid-base deficit: 6 mmol/L — ABNORMAL HIGH (ref 0.0–2.0)
Bicarbonate: 21 mmol/L (ref 20.0–28.0)
Bicarbonate: 19.5 mmol/L — ABNORMAL LOW (ref 20.0–28.0)
Calcium, Ion: 1.02 mmol/L — ABNORMAL LOW (ref 1.15–1.40)
Calcium, Ion: 1.02 mmol/L — ABNORMAL LOW (ref 1.15–1.40)
HCT: 31 % — ABNORMAL LOW (ref 39.0–52.0)
HCT: 34 % — ABNORMAL LOW (ref 39.0–52.0)
Hemoglobin: 10.5 g/dL — ABNORMAL LOW (ref 13.0–17.0)
Hemoglobin: 11.6 g/dL — ABNORMAL LOW (ref 13.0–17.0)
O2 Saturation: 99 %
O2 Saturation: 86 %
Patient temperature: 98.6
Patient temperature: 33.2
Potassium: 4.7 mmol/L (ref 3.5–5.1)
Potassium: 4.2 mmol/L (ref 3.5–5.1)
Sodium: 137 mmol/L (ref 135–145)
Sodium: 138 mmol/L (ref 135–145)
TCO2: 22 mmol/L (ref 22–32)
TCO2: 21 mmol/L — ABNORMAL LOW (ref 22–32)
pCO2 arterial: 43.8 mmHg (ref 32.0–48.0)
pCO2 arterial: 30.8 mmHg — ABNORMAL LOW (ref 32.0–48.0)
pH, Arterial: 7.29 — ABNORMAL LOW (ref 7.350–7.450)
pH, Arterial: 7.392 (ref 7.350–7.450)
pO2, Arterial: 183 mmHg — ABNORMAL HIGH (ref 83.0–108.0)
pO2, Arterial: 41 mmHg — ABNORMAL LOW (ref 83.0–108.0)

## 2018-09-18 LAB — GLUCOSE, CAPILLARY
Glucose-Capillary: 70 mg/dL (ref 70–99)
Glucose-Capillary: 104 mg/dL — ABNORMAL HIGH (ref 70–99)
Glucose-Capillary: 42 mg/dL — CL (ref 70–99)

## 2018-09-18 LAB — CBC WITH DIFFERENTIAL/PLATELET
Abs Immature Granulocytes: 0.03 10*3/uL (ref 0.00–0.07)
Basophils Absolute: 0 10*3/uL (ref 0.0–0.1)
Basophils Relative: 0 %
Eosinophils Absolute: 0 10*3/uL (ref 0.0–0.5)
Eosinophils Relative: 0 %
HCT: 36.6 % — ABNORMAL LOW (ref 39.0–52.0)
Hemoglobin: 11.1 g/dL — ABNORMAL LOW (ref 13.0–17.0)
Immature Granulocytes: 1 %
Lymphocytes Relative: 6 %
Lymphs Abs: 0.3 10*3/uL — ABNORMAL LOW (ref 0.7–4.0)
MCH: 25.8 pg — ABNORMAL LOW (ref 26.0–34.0)
MCHC: 30.3 g/dL (ref 30.0–36.0)
MCV: 85.1 fL (ref 80.0–100.0)
Monocytes Absolute: 0.1 10*3/uL (ref 0.1–1.0)
Monocytes Relative: 3 %
Neutro Abs: 4 10*3/uL (ref 1.7–7.7)
Neutrophils Relative %: 90 %
Platelets: 465 10*3/uL — ABNORMAL HIGH (ref 150–400)
RBC: 4.3 MIL/uL (ref 4.22–5.81)
RDW: 17.8 % — ABNORMAL HIGH (ref 11.5–15.5)
WBC: 4.4 10*3/uL (ref 4.0–10.5)
nRBC: 0.7 % — ABNORMAL HIGH (ref 0.0–0.2)

## 2018-09-18 LAB — COMPREHENSIVE METABOLIC PANEL
ALT: 33 U/L (ref 0–44)
AST: 97 U/L — ABNORMAL HIGH (ref 15–41)
Albumin: 1.6 g/dL — ABNORMAL LOW (ref 3.5–5.0)
Alkaline Phosphatase: 315 U/L — ABNORMAL HIGH (ref 38–126)
Anion gap: 20 — ABNORMAL HIGH (ref 5–15)
BUN: 55 mg/dL — ABNORMAL HIGH (ref 6–20)
CO2: 18 mmol/L — ABNORMAL LOW (ref 22–32)
Calcium: 8.2 mg/dL — ABNORMAL LOW (ref 8.9–10.3)
Chloride: 97 mmol/L — ABNORMAL LOW (ref 98–111)
Creatinine, Ser: 6.24 mg/dL — ABNORMAL HIGH (ref 0.61–1.24)
GFR calc Af Amer: 11 mL/min — ABNORMAL LOW (ref >60)
GFR calc non Af Amer: 9 mL/min — ABNORMAL LOW (ref >60)
Glucose, Bld: 67 mg/dL — ABNORMAL LOW (ref 70–99)
Potassium: 4.4 mmol/L (ref 3.5–5.1)
Sodium: 135 mmol/L (ref 135–145)
Total Bilirubin: 2.1 mg/dL — ABNORMAL HIGH (ref 0.3–1.2)
Total Protein: 7.7 g/dL (ref 6.5–8.1)

## 2018-09-18 LAB — COMPREHENSIVE METABOLIC PANEL WITH GFR
ALT: 19 U/L (ref 0–44)
AST: 54 U/L — ABNORMAL HIGH (ref 15–41)
Albumin: <1 g/dL — ABNORMAL LOW (ref 3.5–5.0)
Alkaline Phosphatase: 199 U/L — ABNORMAL HIGH (ref 38–126)
Anion gap: 15 (ref 5–15)
BUN: 41 mg/dL — ABNORMAL HIGH (ref 6–20)
CO2: 21 mmol/L — ABNORMAL LOW (ref 22–32)
Calcium: 6.6 mg/dL — ABNORMAL LOW (ref 8.9–10.3)
Chloride: 108 mmol/L (ref 98–111)
Creatinine, Ser: 4.87 mg/dL — ABNORMAL HIGH (ref 0.61–1.24)
GFR calc Af Amer: 15 mL/min — ABNORMAL LOW
GFR calc non Af Amer: 13 mL/min — ABNORMAL LOW
Glucose, Bld: 74 mg/dL (ref 70–99)
Potassium: 4.4 mmol/L (ref 3.5–5.1)
Sodium: 144 mmol/L (ref 135–145)
Total Bilirubin: 0.9 mg/dL (ref 0.3–1.2)
Total Protein: 5.2 g/dL — ABNORMAL LOW (ref 6.5–8.1)

## 2018-09-18 LAB — CBC
HCT: 30 % — ABNORMAL LOW (ref 39.0–52.0)
Hemoglobin: 9.1 g/dL — ABNORMAL LOW (ref 13.0–17.0)
MCH: 25.2 pg — ABNORMAL LOW (ref 26.0–34.0)
MCHC: 30.3 g/dL (ref 30.0–36.0)
MCV: 83.1 fL (ref 80.0–100.0)
Platelets: 352 10*3/uL (ref 150–400)
RBC: 3.61 MIL/uL — ABNORMAL LOW (ref 4.22–5.81)
RDW: 18.5 % — ABNORMAL HIGH (ref 11.5–15.5)
WBC: 17.9 10*3/uL — ABNORMAL HIGH (ref 4.0–10.5)
nRBC: 0 % (ref 0.0–0.2)

## 2018-09-18 LAB — PHOSPHORUS: Phosphorus: 5.3 mg/dL — ABNORMAL HIGH (ref 2.5–4.6)

## 2018-09-18 LAB — PROTIME-INR
INR: 2.7 — ABNORMAL HIGH (ref 0.8–1.2)
INR: 2.1 — ABNORMAL HIGH (ref 0.8–1.2)
INR: 2.6 — ABNORMAL HIGH (ref 0.8–1.2)
Prothrombin Time: 28.2 seconds — ABNORMAL HIGH (ref 11.4–15.2)
Prothrombin Time: 23.5 seconds — ABNORMAL HIGH (ref 11.4–15.2)
Prothrombin Time: 27.3 seconds — ABNORMAL HIGH (ref 11.4–15.2)

## 2018-09-18 LAB — CORTISOL: Cortisol, Plasma: 90.3 ug/dL

## 2018-09-18 LAB — TROPONIN I: Troponin I: 0.08 ng/mL (ref <0.03)

## 2018-09-18 LAB — MAGNESIUM
Magnesium: 1.8 mg/dL (ref 1.7–2.4)
Magnesium: 2.1 mg/dL (ref 1.7–2.4)

## 2018-09-18 LAB — APTT: aPTT: 50 seconds — ABNORMAL HIGH (ref 24–36)

## 2018-09-18 LAB — LACTIC ACID, PLASMA: Lactic Acid, Venous: 5.3 mmol/L (ref 0.5–1.9)

## 2018-09-18 MED ORDER — SODIUM CHLORIDE 0.9 % IV SOLN
1.0000 g | INTRAVENOUS | Status: DC
  Filled 2018-09-18 (×2): qty 1

## 2018-09-18 MED ORDER — SODIUM CHLORIDE 0.9 % IV SOLN: 2.0000 g | Freq: Once | INTRAVENOUS | Status: DC

## 2018-09-18 MED ORDER — SODIUM CHLORIDE 0.9 % IV BOLUS
250.0000 mL | Freq: Once | INTRAVENOUS | Status: AC
  Administered 2018-09-18: 250 mL via INTRAVENOUS

## 2018-09-18 MED ORDER — SODIUM CHLORIDE 0.9 % IV SOLN
1.0000 ug/kg/min | INTRAVENOUS | Status: DC
  Administered 2018-09-18: 1 ug/kg/min via INTRAVENOUS
  Filled 2018-09-18: qty 20

## 2018-09-18 MED ORDER — FENTANYL BOLUS VIA INFUSION
50.0000 ug | INTRAVENOUS | Status: DC | PRN
  Filled 2018-09-18: qty 50

## 2018-09-18 MED ORDER — NOREPINEPHRINE 4 MG/250ML-% IV SOLN: 2.0000 ug/min | INTRAVENOUS | Status: DC

## 2018-09-18 MED ORDER — VASOPRESSIN 20 UNIT/ML IV SOLN: 0.0300 [IU]/min | INTRAVENOUS | Status: DC

## 2018-09-18 MED ORDER — HYDROCORTISONE NA SUCCINATE PF 100 MG IJ SOLR
50.0000 mg | Freq: Four times a day (QID) | INTRAMUSCULAR | Status: DC
  Administered 2018-09-18 – 2018-09-19 (×3): 50 mg via INTRAVENOUS
  Filled 2018-09-18 (×3): qty 2

## 2018-09-18 MED ORDER — SODIUM CHLORIDE 0.9 % IV SOLN: 250.0000 mL | INTRAVENOUS | Status: DC

## 2018-09-18 MED ORDER — NOREPINEPHRINE 4 MG/250ML-% IV SOLN
0.0000 ug/min | INTRAVENOUS | Status: DC
  Filled 2018-09-18 (×2): qty 250

## 2018-09-18 MED ORDER — FENTANYL CITRATE (PF) 100 MCG/2ML IJ SOLN: 25.0000 ug | INTRAMUSCULAR | Status: DC | PRN

## 2018-09-18 MED ORDER — ASPIRIN 300 MG RE SUPP
300.0000 mg | RECTAL | Status: AC
  Administered 2018-09-19: 300 mg via RECTAL
  Filled 2018-09-18 (×2): qty 1

## 2018-09-18 MED ORDER — VASOPRESSIN 20 UNIT/ML IV SOLN
0.0300 [IU]/min | INTRAVENOUS | Status: DC
  Administered 2018-09-18: 0.03 [IU]/min via INTRAVENOUS
  Filled 2018-09-18 (×3): qty 2

## 2018-09-18 MED ORDER — ARTIFICIAL TEARS OPHTHALMIC OINT
1.0000 "application " | TOPICAL_OINTMENT | Freq: Three times a day (TID) | OPHTHALMIC | Status: DC
  Administered 2018-09-19 (×2): 1 via OPHTHALMIC
  Filled 2018-09-18: qty 3.5

## 2018-09-18 MED ORDER — MIRTAZAPINE 15 MG PO TABS: 15.0000 mg | ORAL_TABLET | Freq: Every day | ORAL | Status: DC

## 2018-09-18 MED ORDER — FENTANYL 2500MCG IN NS 250ML (10MCG/ML) PREMIX INFUSION
100.0000 ug/h | INTRAVENOUS | Status: DC
  Administered 2018-09-18: 100 ug/h via INTRAVENOUS
  Filled 2018-09-18 (×2): qty 250

## 2018-09-18 MED ORDER — FENTANYL CITRATE (PF) 100 MCG/2ML IJ SOLN
100.0000 ug | Freq: Once | INTRAMUSCULAR | Status: AC
  Administered 2018-09-18: 100 ug via INTRAVENOUS

## 2018-09-18 MED ORDER — HEPARIN SODIUM (PORCINE) 5000 UNIT/ML IJ SOLN
5000.0000 [IU] | Freq: Three times a day (TID) | INTRAMUSCULAR | Status: DC
  Administered 2018-09-19: 5000 [IU] via SUBCUTANEOUS
  Filled 2018-09-18: qty 1

## 2018-09-18 MED ORDER — CISATRACURIUM BOLUS VIA INFUSION
0.1000 mg/kg | Freq: Once | INTRAVENOUS | Status: AC
  Administered 2018-09-18: 11.9 mg via INTRAVENOUS
  Filled 2018-09-18: qty 12

## 2018-09-18 MED ORDER — PROPOFOL 1000 MG/100ML IV EMUL
25.0000 ug/kg/min | INTRAVENOUS | Status: DC
  Administered 2018-09-18 – 2018-09-19 (×2): 25 ug/kg/min via INTRAVENOUS
  Filled 2018-09-18 (×2): qty 100

## 2018-09-18 MED ORDER — NOREPINEPHRINE 4 MG/250ML-% IV SOLN
0.0000 ug/min | INTRAVENOUS | Status: DC
  Administered 2018-09-18: 40 ug/min via INTRAVENOUS
  Filled 2018-09-18: qty 250

## 2018-09-18 MED ORDER — NOREPINEPHRINE 16 MG/250ML-% IV SOLN
0.0000 ug/min | INTRAVENOUS | Status: DC
  Administered 2018-09-18: 70 ug/min via INTRAVENOUS
  Administered 2018-09-19 (×2): 80 ug/min via INTRAVENOUS
  Filled 2018-09-18 (×5): qty 250

## 2018-09-18 MED ORDER — CISATRACURIUM BOLUS VIA INFUSION
0.0500 mg/kg | INTRAVENOUS | Status: DC | PRN
  Filled 2018-09-18: qty 6

## 2018-09-18 MED ORDER — FENTANYL 2500MCG IN NS 250ML (10MCG/ML) PREMIX INFUSION: 100.0000 ug/h | INTRAVENOUS | Status: DC

## 2018-09-18 MED ORDER — DEXTROSE 50 % IV SOLN
25.0000 g | INTRAVENOUS | Status: AC
  Administered 2018-09-18: 25 g via INTRAVENOUS

## 2018-09-18 MED ORDER — DEXTROSE 50 % IV SOLN
INTRAVENOUS | Status: AC
  Administered 2018-09-18: 23:00:00
  Filled 2018-09-18: qty 50

## 2018-09-18 MED ORDER — SODIUM CHLORIDE 0.9 % IV SOLN
INTRAVENOUS | Status: DC
  Administered 2018-09-19: 01:00:00 via INTRAVENOUS
  Administered 2018-09-19: 10 mL via INTRAVENOUS

## 2018-09-18 MED ORDER — ALBUTEROL SULFATE (2.5 MG/3ML) 0.083% IN NEBU: 2.5000 mg | INHALATION_SOLUTION | Freq: Four times a day (QID) | RESPIRATORY_TRACT | Status: DC | PRN

## 2018-09-18 MED ORDER — FENTANYL BOLUS VIA INFUSION: 50.0000 ug | INTRAVENOUS | Status: DC | PRN

## 2018-09-18 MED ORDER — FAMOTIDINE IN NACL 20-0.9 MG/50ML-% IV SOLN: 20.0000 mg | Freq: Two times a day (BID) | INTRAVENOUS | Status: DC

## 2018-09-18 MED ORDER — SODIUM CHLORIDE 0.9 % IV SOLN
INTRAVENOUS | Status: DC
  Administered 2018-09-18: 22:00:00 via INTRAVENOUS

## 2018-09-18 MED ORDER — PROPOFOL 1000 MG/100ML IV EMUL: 25.0000 ug/kg/min | INTRAVENOUS | Status: DC

## 2018-09-18 MED ORDER — SODIUM CHLORIDE 0.9 % IV SOLN
10.0000 mg | Freq: Two times a day (BID) | INTRAVENOUS | Status: DC
  Administered 2018-09-19: 10 mg via INTRAVENOUS
  Filled 2018-09-18 (×3): qty 1

## 2018-09-18 MED ORDER — HYDROCORTISONE NA SUCCINATE PF 100 MG IJ SOLR
50.0000 mg | Freq: Four times a day (QID) | INTRAMUSCULAR | Status: DC
  Administered 2018-09-18: 50 mg via INTRAVENOUS

## 2018-09-18 MED ORDER — CHLORHEXIDINE GLUCONATE 0.12% ORAL RINSE (MEDLINE KIT)
15.0000 mL | Freq: Two times a day (BID) | OROMUCOSAL | Status: DC
  Administered 2018-09-18 – 2018-09-19 (×2): 15 mL via OROMUCOSAL

## 2018-09-18 MED ORDER — VANCOMYCIN HCL 10 G IV SOLR
2000.0000 mg | Freq: Once | INTRAVENOUS | Status: AC
  Administered 2018-09-19: 2000 mg via INTRAVENOUS
  Filled 2018-09-18: qty 2000

## 2018-09-18 MED ORDER — ORAL CARE MOUTH RINSE
15.0000 mL | OROMUCOSAL | Status: DC
  Administered 2018-09-18 – 2018-09-19 (×6): 15 mL via OROMUCOSAL

## 2018-09-18 MED ORDER — MORPHINE SULFATE ER 15 MG PO TBCR: 15.0000 mg | EXTENDED_RELEASE_TABLET | Freq: Two times a day (BID) | ORAL | Status: DC

## 2018-09-18 NOTE — Procedures (Signed)
Arterial Catheter Insertion Procedure Note [name] [name] [id number] 04-Oct-1965   Procedure: Insertion of Arterial Catheter  Indications: Blood pressure monitoring  Procedure Details Consent: Unable to obtain consent because of emergent medical necessity. Time Out: Verified patient identification, verified procedure, site/side was marked, verified correct patient position, special equipment/implants available, medications/allergies/relevent history reviewed, required imaging and test results available.  Performed  Maximum sterile technique was used including antiseptics, cap, gloves, gown, hand hygiene, mask and sheet. Skin prep: Chlorhexidine; local anesthetic administered 20 gauge catheter was inserted into right femoral artery using the Seldinger technique. ULTRASOUND GUIDANCE USED: YES Evaluation Blood flow good; BP tracing good. Complications: No apparent complications.   [name] [name] 10/16/2018

## 2018-09-18 NOTE — Progress Notes (Signed)
[place]  Number: [phone] [facility] ICD  Model # [id number] Serial # [id number]

## 2018-09-18 NOTE — Progress Notes (Signed)
Patient ID: [name] [name], male   DOB: 1965/08/29, 53 y.o.   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    O:BP (!) 87/59 (BP Location: Right Arm) Comment: PA made aware  Pulse (!) 113 Comment: PA made aware  Temp 98.5 F (36.9 C) (Oral)   Resp 20   Wt 119.2 kg   SpO2 93%   BMI 34.67 kg/m   Intake/Output Summary (Last 24 hours) at 10/02/2018 1512 Last data filed at 09/27/2018 1400 Gross per 24 hour  Intake 30 ml  Output 1100 ml  Net -1070 ml   Intake/Output: I/O last 3 completed shifts: In: 180 [P.O.:180] Out: 1000 [Other:1000]  Intake/Output this shift:  Total I/O In: 0  Out: 100 [Emesis/NG output:100] Weight change:  Gen: NAD CVS: no rub Resp: cta Abd: diffuse subcutaneous nodules, tender to palpation Ext: no edema, LAVF +T/B  Recent Labs  Lab 09/13/18 1348 09/14/18 0739  NA 134* 137  K 4.2 3.5  CL 94* 98  CO2 24 26  GLUCOSE 93 95  BUN 54* 30*  CREATININE 7.53* 4.95*  ALBUMIN 1.4* 1.5*  CALCIUM 8.5* 8.4*  PHOS 4.4 3.7   Liver Function Tests: Recent Labs  Lab 09/13/18 1348 09/14/18 0739  ALBUMIN 1.4* 1.5*   No results for input(s): LIPASE, AMYLASE in the last 168 hours. No results for input(s): AMMONIA in the last 168 hours. CBC: Recent Labs  Lab 09/14/18 0739 09/15/18 0715 09/16/18 0710 09/17/18 0837 09/23/2018 0727  WBC 9.0 9.9 12.3* 16.9* 17.9*  HGB 7.0* 8.8* 9.6* 10.2* 9.1*  HCT 23.8* 28.6* 32.9* 35.2* 30.0*  MCV 84.4 83.4 88.2 87.3 83.1  PLT 311 325 273 449* 352   Cardiac Enzymes: No results for input(s): CKTOTAL, CKMB, CKMBINDEX, TROPONINI in the last 168 hours. CBG: No results for input(s): GLUCAP in the last 168 hours.  Iron Studies: No results for input(s): IRON, TIBC, TRANSFERRIN, FERRITIN in the last 72 hours. Studies/Results: No  results found. . sodium chloride   Intravenous Once  . acetaminophen  650 mg Oral Q6H  . amiodarone  200 mg Oral Daily  . atorvastatin  80 mg Oral q1800  . Chlorhexidine Gluconate Cloth  6 each Topical Q0600  . cinacalcet  30 mg Oral Q supper  . [START ON 09/20/2018] darbepoetin (ARANESP) injection - DIALYSIS  100 mcg Intravenous Q Thu-HD  . feeding supplement (NEPRO CARB STEADY)  237 mL Oral BID BM  . feeding supplement (PRO-STAT SUGAR FREE 64)  30 mL Oral TID  . Gerhardt's butt cream   Topical BID  . lanthanum  1,000 mg Oral TID PC  . megestrol  400 mg Oral BID  . midodrine  15 mg Oral TID WC  . mirtazapine  15 mg Oral QHS  . multivitamin  1 tablet Oral QHS  . Warfarin - Pharmacist Dosing Inpatient   Does not apply q1800    BMET    Component Value Date/Time   NA 137 09/14/2018 0739   NA 141 02/09/2018 1011   NA 137 11/13/2013 0408   K 3.5 09/14/2018 0739   K 4.1 11/13/2013 0408   CL 98 09/14/2018 0739   CL 98 11/13/2013 0408   CO2 26 09/14/2018 0739   CO2 25 11/13/2013 0408  GLUCOSE 95 09/14/2018 0739   GLUCOSE 64 (L) 11/13/2013 0408   BUN 30 (H) 09/14/2018 0739   BUN 40 (H) 02/09/2018 1011   BUN 68 (H) 11/13/2013 0408   CREATININE 4.95 (H) 09/14/2018 0739   CREATININE 16.45 (H) 11/13/2013 0408   CALCIUM 8.4 (L) 09/14/2018 0739   CALCIUM 6.8 (LL) 11/13/2013 0408   GFRNONAA 12 (L) 09/14/2018 0739   GFRNONAA 3 (L) 11/13/2013 0408   GFRAA 14 (L) 09/14/2018 0739   GFRAA 3 (L) 11/13/2013 0408   CBC    Component Value Date/Time   WBC 17.9 (H) 10/15/2018 0727   RBC 3.61 (L) 09/22/2018 0727   HGB 9.1 (L) 09/27/2018 0727   HGB 10.9 (L) 02/09/2018 1011   HCT 30.0 (L) 10/06/2018 0727   HCT 35.8 (L) 02/09/2018 1011   PLT 352 10/08/2018 0727   PLT 260 02/09/2018 1011   MCV 83.1 09/25/2018 0727   MCV 80 02/09/2018 1011   MCV 77 (L) 11/13/2013 0408   MCH 25.2 (L) 10/15/2018 0727   MCHC 30.3 09/22/2018 0727   RDW 18.5 (H) 10/06/2018 0727   RDW 15.9 (H) 02/09/2018 1011    RDW 17.1 (H) 11/13/2013 0408   LYMPHSABS 1.0 09/06/2018 1305   LYMPHSABS 1.0 02/09/2018 1011   LYMPHSABS 0.7 (L) 11/13/2013 0408   MONOABS 0.7 09/06/2018 1305   MONOABS 0.4 11/13/2013 0408   EOSABS 0.7 (H) 09/06/2018 1305   EOSABS 0.7 (H) 02/09/2018 1011   EOSABS 0.6 11/13/2013 0408   BASOSABS 0.1 09/06/2018 1305   BASOSABS 0.1 02/09/2018 1011   BASOSABS 0.1 11/13/2013 0408     Assessment/ Plan:  1. Left-sided perinephric hematoma- in setting of supratherapeutic INR s/p 7 units of PRBC's and s/p transcatheter embolization on 08/29/18. 1. Hgb dropped to 7 and transfused 2 units PRBC's with HD 09/14/18.  hgb fairly stable since 2. ESRDtolerating HD, normally  TTS schedule.  Normally [facility] Off schedule Friday due to anemia and need for blood transfusion. Had HD Monday and then Thursday to get back on schedule. 3. 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 1. S/pdeep tissue biopsy5/26/20and appreciate General Surgery's assistance, bx without calciphylaxis. 2. Still suspicious and will continue with current treatment and consider repeat biopsy to another siteor deeper sample. 3. Continue with pain management and aggressive phos control 4. May be leading to some FTT which it can do-  For reimage and palliative consult 4. Anemia:ACD superimprosed with ABLA- on ESA(100 q Tuesday) and transfuse prn- last 5/29- hgb 9.1 today  5. CKD-MBD:non-calcium based binders (fosrenol) , senispar, sodium thiosulfate for probablecalciphylaxis 6. Severe protein malnutrition- supplements 7. Nutrition:renal diet 8. Hypertension:stable/chronic hypotension on midodrine 9. AVR- on coumadin 10. Atrial fibrillation- on amio and anticoagulation  Louis Meckel

## 2018-09-18 NOTE — Progress Notes (Addendum)
[place] PHYSICAL MEDICINE & REHABILITATION PROGRESS NOTE   Subjective/Complaints: Pt states his pain is a little better. Still difficult to do much from an activity standpoint. Appetite is still poor. Losing weight  ROS: Patient denies fever, rash, sore throat, blurred vision, nausea, vomiting, diarrhea, cough, shortness of breath or chest pain,   headache, or mood change.    Objective:   No results found. Recent Labs    09/17/18 0837 10/13/2018 0727  WBC 16.9* 17.9*  HGB 10.2* 9.1*  HCT 35.2* 30.0*  PLT 449* 352   No results for input(s): NA, K, CL, CO2, GLUCOSE, BUN, CREATININE, CALCIUM in the last 72 hours.  Intake/Output Summary (Last 24 hours) at 10/04/2018 1326 Last data filed at 10/02/2018 1240 Gross per 24 hour  Intake 80 ml  Output 1001 ml  Net -921 ml     Physical Exam: Vital Signs Blood pressure (!) 117/97, pulse 100, temperature 98 F (36.7 C), temperature source Oral, resp. rate 16, weight 119.2 kg, SpO2 95 %. Constitutional: No distress . Vital signs reviewed. HEENT: EOMI, oral membranes moist Neck: supple Cardiovascular: RRR without murmur. No JVD    Respiratory: CTA Bilaterally without wheezes or rales. Normal effort    GI: BS +, non-tender, non-distended  Musc: Bilateral lower extremity edema, tender trunk and abdomen Neurological: Alert and oriented Motor:   Bilateral lower extremities: Grossly 3- to 3/5 LE, 4/5 UE Skin: Skin iswarmand dry.  Multiple 1 to 2 cm subcutaneous soft nodules along abdomen, ongoing, still tender. Psychiatric: Normal mood.  Normal affect.   Assessment/Plan: 1. Functional deficits secondary to debility which require 3+ hours per day of interdisciplinary therapy in a comprehensive inpatient rehab setting.  Physiatrist is providing close team supervision and 24 hour management of active medical problems listed below.  Physiatrist and rehab team continue to assess barriers to discharge/monitor patient progress toward  functional and medical goals  Care Tool:  Bathing    Body parts bathed by patient: Right arm, Left arm, Chest, Abdomen, Front perineal area, Face   Body parts bathed by helper: Buttocks, Right upper leg, Left upper leg, Right lower leg, Left lower leg     Bathing assist Assist Level: Moderate Assistance - Patient 50 - 74%     Upper Body Dressing/Undressing Upper body dressing   What is the patient wearing?: Pull over shirt    Upper body assist Assist Level: Contact Guard/Touching assist    Lower Body Dressing/Undressing Lower body dressing      What is the patient wearing?: Pants     Lower body assist Assist for lower body dressing: Maximal Assistance - Patient 25 - 49%     Toileting Toileting Toileting Activity did not occur (Clothing management and hygiene only): N/A (no void or bm)(HD pt)  Toileting assist Assist for toileting: Dependent - Patient 0%     Transfers Chair/bed transfer  Transfers assist  Chair/bed transfer activity did not occur: Safety/medical concerns  Chair/bed transfer assist level: 2 Helpers     Locomotion Ambulation   Ambulation assist   Ambulation activity did not occur: Safety/medical concerns          Walk 10 feet activity   Assist  Walk 10 feet activity did not occur: Safety/medical concerns        Walk 50 feet activity   Assist Walk 50 feet with 2 turns activity did not occur: Safety/medical concerns         Walk 150 feet activity   Assist Walk 150  feet activity did not occur: Safety/medical concerns         Walk 10 feet on uneven surface  activity   Assist Walk 10 feet on uneven surfaces activity did not occur: Safety/medical Armed forces technical officer activity did not occur: Refused         Wheelchair 50 feet with 2 turns activity    Assist    Wheelchair 50 feet with 2 turns activity did not occur: Refused       Wheelchair 150 feet activity      Assist Wheelchair 150 feet activity did not occur: Refused        Medical Problem List and Plan: 1.Functional and mobility deficitssecondary to debility after multiple medical issues  -recent hx of bleeding peri-nephric cyst complicated by elevated INR Continue CIR  -spoke to patient about where he wanted to go with his care. He wants to get stronger and be there for his family but doesn't know if he has the will to do it. I discussed the possibility of a palliative care consult to help determine the goals of his care, and he was in support of this. I attempted to contact his wife, as did he, and we were not able to find her.   2. Antithrombotics: -DVT/anticoagulation:Pharmaceutical:Coumadin INR therapeutic on 5/31 -antiplatelet therapy: N/A 3. Pain Management:  PRN oxycodone-   -ms contin 30mg  q12--given sedation and lackluster response we will hold ms contin .  Ice, heat prn to abdomen/flank.   Fair control at best 4. Mood:I discussed depression with the patient, and he admits to this as well as some anxiety.   -will begin a trial of remeron which may help appetite also. Pt agreed.   -neuropsych to see. -antipsychotic agents: N/A 5. Neuropsych: This patientiscapable of making decisions on his own behalf. 6. Skin/Wound Care:Routine pressure relief measures.   -Abdominal nodules suspicious for calciphylaxis  -nephrology has added sensipar and sodium thiosulfate with HD  -surgical bx revealed normal tissue however. Re-bx?  -unfortunately no "quick fix here' 7. Fluids/Electrolytes/Nutrition: . Continue to offer nephro supplements and pro-stat.  -intake seemed to be picking, but in totality it hasn't. Weight has continued to trend down  -RD continues to recommend additional nutritional support  -continue megace trial   -pt is opposed to starting TF's at this point. Will speak with his wife and palliative care 8. Anemia of  chronic disease: Received Feraheme 5/18 and on Aranesp weekly. .  Hemoglobin 9.6 on 5/31, after transfusion on 5/29  -ongoing abd pain, hx of perinephric cyst/bleed---will repeat CT of abdomen without contrast 9. CAD s/p AVR/ICM s/p OOI:[id number] HR BID--continue amiodarone daily. On coumadin 10. Polycystic kidney disease/ESRD:HD scheduled at end of the day to allow full participation in therapies 11. Chronic hypotension: On midodrine tid  Worse after HD  LOS: 14 days A FACE TO FACE EVALUATION WAS PERFORMED  Meredith Staggers 10/06/2018, 1:26 PM

## 2018-09-18 NOTE — Progress Notes (Signed)
2 units of PRBC and 2 units of FFP given emergently on patient's arrival via emergency blood release done by [facility].

## 2018-09-18 NOTE — Discharge Summary (Signed)
Physician Discharge Summary  Patient ID: [name] [name] MRN: [id number] DOB/AGE: 1965-10-15 53 y.o.  Admit date: 09/04/2018 Discharge date: 10/14/2018  Discharge Diagnoses:  Active Problems:   Debility   Mass   Hemodialysis-associated hypotension   Anemia of chronic disease   Generalized abdominal pain   Labile blood pressure   Goals of care, counseling/discussion   Palliative care by specialist   DNR (do not resuscitate) discussion   Discharged Condition: Critical    Significant Diagnostic Studies: Ct Abdomen Pelvis W Wo Contrast  Result Date: 08/27/2018 CLINICAL DATA:  History end-stage renal disease secondary to polycystic kidney disease now with enlarging left-sided renal hemorrhage EXAM: CT ABDOMEN AND PELVIS WITHOUT AND WITH CONTRAST TECHNIQUE: Multidetector CT imaging of the abdomen and pelvis was performed following the standard protocol before and following the bolus administration of intravenous contrast. CONTRAST:  151m OMNIPAQUE IOHEXOL 300 MG/ML  SOLN COMPARISON:  08/26/2018; 08/19/2018; 08/04/2018; 08/01/2018; 07/23/2018 FINDINGS: Lower chest: Limited visualization lower thorax demonstrates trace left-sided pleural effusion with associated dependent subpleural ground-glass atelectasis. Normal heart size. Coronary artery calcifications. Post median sternotomy and aortic valve replacement. No pericardial effusion. Hepatobiliary: Normal hepatic contour. No discrete hepatic lesions. Unchanged approximately 0.8 cm stone lying dependently within the lumen of otherwise normal-appearing gallbladder. No gallbladder wall thickening or pericholecystic fluid. No intrahepatic biliary duct dilatation. No ascites. Pancreas: Normal appearance of the pancreas. Spleen: Subcentimeter hypoattenuating lesion within the hilum of the spleen is too small to adequately characterize though may represent a splenic cyst. Adrenals/Urinary Tract: Re-demonstrated sequela of advanced polycystic kidney disease.  Again, the left kidney is near completely replaced with multiple hemorrhagic cysts with multiple hematocrit levels demonstrated on the precontrast images (representative images 26, 37 and 57, series 3). The hemorrhagic left kidney has continued to enlarge, now measuring approximately 20.4 x 17.7 x 23.7 cm (axial image 63, series 10; coronal image 46, series 13), previously, 18.3 x 16.3 x 21.9 cm (compared to the 08/19/2018), now also with slight extension to involve the caudal aspect of left retroperitoneal space (image 111, series 15) but without definitive intraperitoneal extension. Ill-defined punctate (subcentimeter) areas of vessel irregularity are seen involving the anterior (axial image 81, series 10; coronal image 27, series 13) and posteromedial aspect (axial image 89, series 10; coronal image 58, series 13) of the left kidney without sizable area of contrast extravasation. No discrete underlying hyperenhancing left-sided renal lesion. No evidence of right-sided renal hemorrhage. Normal appearance of the bilateral adrenal glands. Normal appearance of the urinary bladder given degree of distention. Stomach/Bowel: Rather extensive colonic diverticulosis without evidence of superimposed acute diverticulitis. No pneumoperitoneum, pneumatosis or portal venous gas. Vascular/Lymphatic: Atherosclerotic plaque within a normal caliber abdominal aorta. Note is made of a solitary left renal artery. While the left renal artery appears somewhat diminutive does appear patent and again, there are no sizable areas of contrast extravasation to suggest acute arterial extravasation. Reproductive: Normal appearance the prostate gland. No free fluid the pelvic cul-de-sac. Other: Diffuse body wall anasarca. Note is made of a approximately 5.9 x 3.6 cm mesenteric fat containing periumbilical hernia. Musculoskeletal: No acute or aggressive osseous abnormalities. Moderate severe multilevel lumbar spine DDD. Stigmata of DISH within  the caudal aspect of the thoracic spine. IMPRESSION: 1. Sequela of advanced polycystic kidney disease with continued enlargement of complex intraparenchymal and perinephric hemorrhage replacing the entirety of the left kidney without discrete area of contrast extravasation or hyperenhancing underlying renal lesion. 2. Colonic diverticulosis without evidence of superimposed acute diverticulitis. 3. Cholelithiasis without  evidence of cholecystitis. 4.  Aortic Atherosclerosis (ICD10-I70.0). Electronically Signed   By: Sandi Mariscal M.D.   On: 08/27/2018 16:23   Ct Abdomen Pelvis Wo Contrast  Result Date: 08/26/2018 CLINICAL DATA:  History of perinephric hematoma.  Lower back pain. EXAM: CT ABDOMEN AND PELVIS WITHOUT CONTRAST TECHNIQUE: Multidetector CT imaging of the abdomen and pelvis was performed following the standard protocol without IV contrast. COMPARISON:  CT scan of Aug 19, 2018. FINDINGS: Lower chest: Minimal left pleural effusion is noted with adjacent subsegmental atelectasis. Hepatobiliary: Solitary gallstone is noted. No biliary dilatation is noted. No focal abnormality is noted in the patent parenchyma on these unenhanced images. Pancreas: Unremarkable. No pancreatic ductal dilatation or surrounding inflammatory changes. Spleen: Normal in size without focal abnormality. Adrenals/Urinary Tract: Adrenal glands are unremarkable. Severe polycystic kidney disease is again noted. The left kidney is markedly enlarged with multiple layering high density area is consistent with large perinephric hematoma. The kidney currently measures 24 by 20 x 16 cm which is slightly enlarged compared to prior exam. Urinary bladder is unremarkable. No definite hydronephrosis is noted. Stomach/Bowel: The stomach appears normal. The appendix appears normal. There is no evidence of bowel obstruction or inflammation. Sigmoid diverticulosis is noted without inflammation. Vascular/Lymphatic: Aortic atherosclerosis. No enlarged  abdominal or pelvic lymph nodes. Reproductive: Prostate is unremarkable. Other: Stable fat containing ventral hernia is noted in periumbilical region. Musculoskeletal: No acute or significant osseous findings. IMPRESSION: Findings consistent with polycystic kidney disease. Continued presence of large left perinephric hematoma which may be slightly increased in size compared to prior exam. No definite hydronephrosis is noted currently. Sigmoid diverticulosis without inflammation. Cholelithiasis. Aortic Atherosclerosis (ICD10-I70.0). Electronically Signed   By: Marijo Conception M.D.   On: 08/26/2018 12:50   Ir Angiogram Renal Uni Selective  Result Date: 08/29/2018 INDICATION: History of polycystic kidney disease with end-stage renal disease, currently on dialysis. Patient recently admitted with symptomatic hemorrhagic left-sided renal cysts initially attributable to supratherapeutic INR. Unfortunately, despite continued conservative measures, the now diffuse left-sided renal hemorrhage has enlarged, and as such, request made by Dr. Alinda Money (Urology) for percutaneous embolization. EXAM: 1. ULTRASOUND GUIDANCE FOR ARTERIAL ACCESS 2. SELECTIVE LEFT RENAL ARTERIOGRAM AND PERCUTANEOUS COIL EMBOLIZATION 3. SUB SELECTIVE SEGMENTAL ARTERIOGRAM AND PERCUTANEOUS COIL EMBOLIZATION MEDICATIONS: None; the patient is admitted to the hospital receiving intravenous antibiotics. The antibiotic was administered within one hour of the procedure ANESTHESIA/SEDATION: Moderate (conscious) sedation was employed during this procedure. A total of Versed 2 mg and Fentanyl 100 mcg was administered intravenously. Moderate Sedation Time: 85 minutes. The patient's level of consciousness and vital signs were monitored continuously by radiology nursing throughout the procedure under my direct supervision. CONTRAST:  75 cc Isovue-300 FLUOROSCOPY TIME:  18 minutes, 42 seconds (7,867 mGy) COMPLICATIONS: None immediate. PROCEDURE: Informed consent was  obtained from the patient following explanation of the procedure, risks, benefits and alternatives. The patient understands, agrees and consents for the procedure. All questions were addressed. A time out was performed prior to the initiation of the procedure. Maximal barrier sterile technique utilized including caps, mask, sterile gowns, sterile gloves, large sterile drape, hand hygiene, and Betadine prep. The right femoral head was marked fluoroscopically. Under sterile conditions and local anesthesia, the right common femoral artery access was performed with a micropuncture needle. Under direct ultrasound guidance, the right common femoral was accessed with a micropuncture kit. An ultrasound image was saved for documentation purposes. This allowed for placement of a 5-French vascular sheath. A limited arteriogram was performed through the  side arm of the sheath confirming appropriate access within the right common femoral artery. A 5 [place] Mickelson catheter was advanced to the level of the abdominal aorta where it was formed, back bled and flushed. The catheter was utilized to select the left renal artery and a selective left renal arteriogram was performed. Next, with the use of a fathom 14 microwire, a regular Renegade microcatheter was utilized to select a subsegmental branch vessel of the interpolar division of the left renal artery supplying an ill-defined area of contrast extravasation. Contrast injection confirmed appropriate positioning and the subsegmental artery was coil embolized with 2 overlapping 2 mm and 3 mm interlock coils. Efforts were made to cannulate several additional segmental branches of the left renal artery supplying additional sites of contrast extravasation however ultimately this was deemed unnecessary due to the diffuse hemorrhage involving the entirety of left kidney. As such, the distal aspect of the interpolar division of the left renal artery was percutaneously coil embolized  with multiple overlapping 4 mm, 5 mm and 6 mm interlock coils with extension of the coil pack into the origins of both the superior and inferior polar branches. Microcatheter was removed and completion left renal arteriogram was performed via the [facility] catheter. Images were reviewed and the procedure was terminated. At this point, all wires, catheters and sheaths were removed from the patient. Hemostasis was achieved at the right groin access site with manual compression. The patient tolerated the procedure well without immediate post procedural complication. FINDINGS: Selective left renal arteriogram demonstrates at least 3 ill-defined areas of active extravasation involving the superior, interpolar and inferior poles of the left kidney. Initially, a subsegmental branch of the interpolar segment of the left renal artery was selected and percutaneously coil embolized. Efforts were made to select the additional subsegmental arteries supplying the ill-defined areas of contrast extravasation within both the superior and inferior pole the left kidney, however ultimately this was deemed unnecessary given diffuse hemorrhage affecting the left kidney. As such, the entirety of the left renal artery was percutaneously coil embolized from the distal aspect of the interpolar division of the left renal artery (with extension of the coil pack into the origin's of both the superior and inferior polar branches) to the level of the mid/distal left main renal artery. Following percutaneous embolization there is near complete occlusion of the left renal artery without definitive area of persistent contrast extravasation. IMPRESSION: Technically successful percutaneous coil embolization of the left renal artery for diffuse left-sided renal hemorrhage secondary to polycystic kidney disease. PLAN: - The patient was given a PCA pump for pain control. - Recommend continued observation and resuscitative management as deemed appropriate  by the providing clinical team. Electronically Signed   By: Sandi Mariscal M.D.   On: 08/29/2018 15:11   Ir Fluoro Guide Cv Line Right  Result Date: 08/27/2018 INDICATION: Poor venous access. In need of durable intravenous access for medication administration and blood draws while admitted to the hospital. Patient with end-stage renal disease, on dialysis, as such, request made for placement of a non tunneled central venous catheter. EXAM: ULTRASOUND AND FLUOROSCOPIC GUIDED NON TUNNELED CENTRAL VENOUS CATHETER INSERTION MEDICATIONS: None. CONTRAST:  None FLUOROSCOPY TIME:  3 minutes, 36 seconds (81.0 mGy) COMPLICATIONS: None immediate. TECHNIQUE: The procedure, risks, benefits, and alternatives were explained to the patient and informed written consent was obtained. A timeout was performed prior to the initiation of the procedure. The right chest and neck were prepped with chlorhexidine in a sterile fashion, and a  sterile drape was applied covering the operative field. Maximum barrier sterile technique with sterile gowns and gloves were used for the procedure. A timeout was performed prior to the initiation of the procedure. Local anesthesia was provided with 1% lidocaine. Sonographic evaluation performed right neck and failed to delineate a patent right internal jugular vein, however a patent external jugular vein was identified. An ultrasound image was saved procedural documentation purposes. After the overlying soft tissues were anesthetized, a small venotomy incision was created and a micropuncture kit was utilized to access the right external jugular vein. Real-time ultrasound guidance was utilized for vascular access including the acquisition of a permanent ultrasound image documenting patency of the accessed vessel. As there was some difficulty advancing the guidewire centrally, limited contrast injection was performed via the outer sheath of the micropuncture kit demonstrating tortuosity though patency of  the SVC. A guidewire was advanced to the level of the superior caval-atrial junction for measurement purposes and the PICC line was cut to length. A peel-away sheath was placed and a 20 cm, 5 [place], dual lumen was inserted to level of the superior caval-atrial junction. A post procedure spot fluoroscopic was obtained. The catheter easily aspirated and flushed and was sutured in place. A dressing was placed. The patient tolerated the procedure well without immediate post procedural complication. FINDINGS: After catheter placement, the tip lies within the superior cavoatrial junction. The catheter aspirates and flushes normally and is ready for immediate use. IMPRESSION: Successful ultrasound and fluoroscopic guided placement of a right external jugular vein approach, 20 cm, 5 French, dual lumen PICC with tip at the superior caval-atrial junction. The PICC line is ready for immediate use. Electronically Signed   By: Sandi Mariscal M.D.   On: 08/27/2018 14:57   Ir US Guide Vasc Access Right  Result Date: 08/29/2018 INDICATION: History of polycystic kidney disease with end-stage renal disease, currently on dialysis. Patient recently admitted with symptomatic hemorrhagic left-sided renal cysts initially attributable to supratherapeutic INR. Unfortunately, despite continued conservative measures, the now diffuse left-sided renal hemorrhage has enlarged, and as such, request made by Dr. Alinda Money (Urology) for percutaneous embolization. EXAM: 1. ULTRASOUND GUIDANCE FOR ARTERIAL ACCESS 2. SELECTIVE LEFT RENAL ARTERIOGRAM AND PERCUTANEOUS COIL EMBOLIZATION 3. SUB SELECTIVE SEGMENTAL ARTERIOGRAM AND PERCUTANEOUS COIL EMBOLIZATION MEDICATIONS: None; the patient is admitted to the hospital receiving intravenous antibiotics. The antibiotic was administered within one hour of the procedure ANESTHESIA/SEDATION: Moderate (conscious) sedation was employed during this procedure. A total of Versed 2 mg and Fentanyl 100 mcg was  administered intravenously. Moderate Sedation Time: 85 minutes. The patient's level of consciousness and vital signs were monitored continuously by radiology nursing throughout the procedure under my direct supervision. CONTRAST:  75 cc Isovue-300 FLUOROSCOPY TIME:  18 minutes, 42 seconds (9,390 mGy) COMPLICATIONS: None immediate. PROCEDURE: Informed consent was obtained from the patient following explanation of the procedure, risks, benefits and alternatives. The patient understands, agrees and consents for the procedure. All questions were addressed. A time out was performed prior to the initiation of the procedure. Maximal barrier sterile technique utilized including caps, mask, sterile gowns, sterile gloves, large sterile drape, hand hygiene, and Betadine prep. The right femoral head was marked fluoroscopically. Under sterile conditions and local anesthesia, the right common femoral artery access was performed with a micropuncture needle. Under direct ultrasound guidance, the right common femoral was accessed with a micropuncture kit. An ultrasound image was saved for documentation purposes. This allowed for placement of a 5-French vascular sheath. A limited arteriogram was  performed through the side arm of the sheath confirming appropriate access within the right common femoral artery. A 5 [place] Mickelson catheter was advanced to the level of the abdominal aorta where it was formed, back bled and flushed. The catheter was utilized to select the left renal artery and a selective left renal arteriogram was performed. Next, with the use of a fathom 14 microwire, a regular Renegade microcatheter was utilized to select a subsegmental branch vessel of the interpolar division of the left renal artery supplying an ill-defined area of contrast extravasation. Contrast injection confirmed appropriate positioning and the subsegmental artery was coil embolized with 2 overlapping 2 mm and 3 mm interlock coils. Efforts were  made to cannulate several additional segmental branches of the left renal artery supplying additional sites of contrast extravasation however ultimately this was deemed unnecessary due to the diffuse hemorrhage involving the entirety of left kidney. As such, the distal aspect of the interpolar division of the left renal artery was percutaneously coil embolized with multiple overlapping 4 mm, 5 mm and 6 mm interlock coils with extension of the coil pack into the origins of both the superior and inferior polar branches. Microcatheter was removed and completion left renal arteriogram was performed via the [facility] catheter. Images were reviewed and the procedure was terminated. At this point, all wires, catheters and sheaths were removed from the patient. Hemostasis was achieved at the right groin access site with manual compression. The patient tolerated the procedure well without immediate post procedural complication. FINDINGS: Selective left renal arteriogram demonstrates at least 3 ill-defined areas of active extravasation involving the superior, interpolar and inferior poles of the left kidney. Initially, a subsegmental branch of the interpolar segment of the left renal artery was selected and percutaneously coil embolized. Efforts were made to select the additional subsegmental arteries supplying the ill-defined areas of contrast extravasation within both the superior and inferior pole the left kidney, however ultimately this was deemed unnecessary given diffuse hemorrhage affecting the left kidney. As such, the entirety of the left renal artery was percutaneously coil embolized from the distal aspect of the interpolar division of the left renal artery (with extension of the coil pack into the origin's of both the superior and inferior polar branches) to the level of the mid/distal left main renal artery. Following percutaneous embolization there is near complete occlusion of the left renal artery without  definitive area of persistent contrast extravasation. IMPRESSION: Technically successful percutaneous coil embolization of the left renal artery for diffuse left-sided renal hemorrhage secondary to polycystic kidney disease. PLAN: - The patient was given a PCA pump for pain control. - Recommend continued observation and resuscitative management as deemed appropriate by the providing clinical team. Electronically Signed   By: Sandi Mariscal M.D.   On: 08/29/2018 15:11   Ir US Guide Vasc Access Right  Result Date: 08/27/2018 INDICATION: Poor venous access. In need of durable intravenous access for medication administration and blood draws while admitted to the hospital. Patient with end-stage renal disease, on dialysis, as such, request made for placement of a non tunneled central venous catheter. EXAM: ULTRASOUND AND FLUOROSCOPIC GUIDED NON TUNNELED CENTRAL VENOUS CATHETER INSERTION MEDICATIONS: None. CONTRAST:  None FLUOROSCOPY TIME:  3 minutes, 36 seconds (16.1 mGy) COMPLICATIONS: None immediate. TECHNIQUE: The procedure, risks, benefits, and alternatives were explained to the patient and informed written consent was obtained. A timeout was performed prior to the initiation of the procedure. The right chest and neck were prepped with chlorhexidine in a sterile  fashion, and a sterile drape was applied covering the operative field. Maximum barrier sterile technique with sterile gowns and gloves were used for the procedure. A timeout was performed prior to the initiation of the procedure. Local anesthesia was provided with 1% lidocaine. Sonographic evaluation performed right neck and failed to delineate a patent right internal jugular vein, however a patent external jugular vein was identified. An ultrasound image was saved procedural documentation purposes. After the overlying soft tissues were anesthetized, a small venotomy incision was created and a micropuncture kit was utilized to access the right external  jugular vein. Real-time ultrasound guidance was utilized for vascular access including the acquisition of a permanent ultrasound image documenting patency of the accessed vessel. As there was some difficulty advancing the guidewire centrally, limited contrast injection was performed via the outer sheath of the micropuncture kit demonstrating tortuosity though patency of the SVC. A guidewire was advanced to the level of the superior caval-atrial junction for measurement purposes and the PICC line was cut to length. A peel-away sheath was placed and a 20 cm, 5 [place], dual lumen was inserted to level of the superior caval-atrial junction. A post procedure spot fluoroscopic was obtained. The catheter easily aspirated and flushed and was sutured in place. A dressing was placed. The patient tolerated the procedure well without immediate post procedural complication. FINDINGS: After catheter placement, the tip lies within the superior cavoatrial junction. The catheter aspirates and flushes normally and is ready for immediate use. IMPRESSION: Successful ultrasound and fluoroscopic guided placement of a right external jugular vein approach, 20 cm, 5 French, dual lumen PICC with tip at the superior caval-atrial junction. The PICC line is ready for immediate use. Electronically Signed   By: Sandi Mariscal M.D.   On: 08/27/2018 14:57   Dg Chest Port 1 View  Result Date: 08/19/2018 CLINICAL DATA:  Abdominal pain, bleeding of the penis EXAM: PORTABLE CHEST 1 VIEW COMPARISON:  08/01/2018 FINDINGS: There is no focal parenchymal opacity. There is no pleural effusion or pneumothorax. The heart and mediastinal contours are unremarkable. There is prior median sternotomy. The osseous structures are unremarkable. IMPRESSION: No active disease. Electronically Signed   By: Kathreen Devoid   On: 08/19/2018 18:49   [place] Chest Soft Tissue  Result Date: 09/14/2018 CLINICAL DATA:  Painful left chest wall mass. EXAM: ULTRASOUND OF CHEST SOFT  TISSUES TECHNIQUE: Ultrasound examination of the chest wall soft tissues was performed in the area of clinical concern. COMPARISON:  CT abdomen pelvis dated July 23, 2018. CT chest dated January 13, 2018. FINDINGS: Focused ultrasound in the area of clinical concern over the left lateral chest wall demonstrates ill-defined 6.3 x 2.9 cm complex fluid. There is no discrete soft tissue mass. IMPRESSION: 1. Ill-defined 6.3 x 2.9 cm complex fluid in the left lateral chest wall. This correlates with an area of ill-defined hyperdensity seen on recent CTs, and is at the site of prior left chest wall pacemaker based on chest CT from September 2019. 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 2. No discrete soft tissue mass. Electronically Signed   By: Titus Dubin M.D.   On: 09/14/2018 13:10   Ir Embo Art  [place] Guide Roadmapping  Result Date: 08/29/2018 INDICATION: History of polycystic kidney disease with end-stage renal disease, currently on dialysis. Patient recently admitted with symptomatic hemorrhagic left-sided renal cysts initially attributable to supratherapeutic INR. Unfortunately, despite continued conservative measures, the now diffuse left-sided renal hemorrhage has enlarged, and  as such, request made by Dr. Alinda Money (Urology) for percutaneous embolization. EXAM: 1. ULTRASOUND GUIDANCE FOR ARTERIAL ACCESS 2. SELECTIVE LEFT RENAL ARTERIOGRAM AND PERCUTANEOUS COIL EMBOLIZATION 3. SUB SELECTIVE SEGMENTAL ARTERIOGRAM AND PERCUTANEOUS COIL EMBOLIZATION MEDICATIONS: None; the patient is admitted to the hospital receiving intravenous antibiotics. The antibiotic was administered within one hour of the procedure ANESTHESIA/SEDATION: Moderate (conscious) sedation was employed during this procedure. A total of Versed 2 mg and Fentanyl 100 mcg was administered intravenously. Moderate Sedation Time: 85 minutes. The patient's level of consciousness and vital signs were  monitored continuously by radiology nursing throughout the procedure under my direct supervision. CONTRAST:  75 cc Isovue-300 FLUOROSCOPY TIME:  18 minutes, 42 seconds (9,629 mGy) COMPLICATIONS: None immediate. PROCEDURE: Informed consent was obtained from the patient following explanation of the procedure, risks, benefits and alternatives. The patient understands, agrees and consents for the procedure. All questions were addressed. A time out was performed prior to the initiation of the procedure. Maximal barrier sterile technique utilized including caps, mask, sterile gowns, sterile gloves, large sterile drape, hand hygiene, and Betadine prep. The right femoral head was marked fluoroscopically. Under sterile conditions and local anesthesia, the right common femoral artery access was performed with a micropuncture needle. Under direct ultrasound guidance, the right common femoral was accessed with a micropuncture kit. An ultrasound image was saved for documentation purposes. This allowed for placement of a 5-French vascular sheath. A limited arteriogram was performed through the side arm of the sheath confirming appropriate access within the right common femoral artery. A 5 [place] Mickelson catheter was advanced to the level of the abdominal aorta where it was formed, back bled and flushed. The catheter was utilized to select the left renal artery and a selective left renal arteriogram was performed. Next, with the use of a fathom 14 microwire, a regular Renegade microcatheter was utilized to select a subsegmental branch vessel of the interpolar division of the left renal artery supplying an ill-defined area of contrast extravasation. Contrast injection confirmed appropriate positioning and the subsegmental artery was coil embolized with 2 overlapping 2 mm and 3 mm interlock coils. Efforts were made to cannulate several additional segmental branches of the left renal artery supplying additional sites of contrast  extravasation however ultimately this was deemed unnecessary due to the diffuse hemorrhage involving the entirety of left kidney. As such, the distal aspect of the interpolar division of the left renal artery was percutaneously coil embolized with multiple overlapping 4 mm, 5 mm and 6 mm interlock coils with extension of the coil pack into the origins of both the superior and inferior polar branches. Microcatheter was removed and completion left renal arteriogram was performed via the [facility] catheter. Images were reviewed and the procedure was terminated. At this point, all wires, catheters and sheaths were removed from the patient. Hemostasis was achieved at the right groin access site with manual compression. The patient tolerated the procedure well without immediate post procedural complication. FINDINGS: Selective left renal arteriogram demonstrates at least 3 ill-defined areas of active extravasation involving the superior, interpolar and inferior poles of the left kidney. Initially, a subsegmental branch of the interpolar segment of the left renal artery was selected and percutaneously coil embolized. Efforts were made to select the additional subsegmental arteries supplying the ill-defined areas of contrast extravasation within both the superior and inferior pole the left kidney, however ultimately this was deemed unnecessary given diffuse hemorrhage affecting the left kidney. As such, the entirety of the left renal artery was percutaneously coil  embolized from the distal aspect of the interpolar division of the left renal artery (with extension of the coil pack into the origin's of both the superior and inferior polar branches) to the level of the mid/distal left main renal artery. Following percutaneous embolization there is near complete occlusion of the left renal artery without definitive area of persistent contrast extravasation. IMPRESSION: Technically successful percutaneous coil embolization of  the left renal artery for diffuse left-sided renal hemorrhage secondary to polycystic kidney disease. PLAN: - The patient was given a PCA pump for pain control. - Recommend continued observation and resuscitative management as deemed appropriate by the providing clinical team. Electronically Signed   By: Sandi Mariscal M.D.   On: 08/29/2018 15:11    Labs:  Basic Metabolic Panel: Recent Labs  Lab 09/13/18 1348 09/14/18 0739  NA 134* 137  K 4.2 3.5  CL 94* 98  CO2 24 26  GLUCOSE 93 95  BUN 54* 30*  CREATININE 7.53* 4.95*  CALCIUM 8.5* 8.4*  PHOS 4.4 3.7    CBC: Recent Labs  Lab 09/16/18 0710 09/17/18 0837 09/24/2018 0727  WBC 12.3* 16.9* 17.9*  HGB 9.6* 10.2* 9.1*  HCT 32.9* 35.2* 30.0*  MCV 88.2 87.3 83.1  PLT 273 449* 352    Lab Results  Component Value Date   INR 2.7 (H) 10/10/2018   INR 3.0 (H) 09/17/2018   INR 2.3 (H) 09/16/2018   CBG: No results for input(s): GLUCAP in the last 168 hours.  Brief HPI:   [name] [name] is a 53 year old male with history of ESRD due to polycystic kidney disease-- HD TTS, HTN, CAD/AS s/p CABG with mechanical AVR on chronic coumadin recent admission 4/15-4/21 for supratherapeutic INR and ruptured hemorrhagic renal cyst.  He was readmitted on 08/19/2018 with tachycardia, fall a few days prior to admission with difficulty walking and inability to get to HD, LUQ and flank pain radiating to back with hematuria.  CT abdomen at OSH showed increase in size of perinephric hematoma and INR noted to be supratherapeutic at 5.5.  INR was reversed and he was transfused with 1 units packed red blood cells for hemoglobin of 7.6.  Urology consulted and recommended supportive care with bedrest and serial H&H till hemoglobin stable.     He continued to have ongoing blood loss with pain and required 5 total units of PRBC. Dr. Alinda Money recommended consulting radiology consulted for assistance and patient underwent left renal embolization by Dr. Pascal Lux on  05/13.   Coumadin resumed and  INR trending back upwards.  He has been weaned off PCA and physical therapy initiated however patient has been unable to tolerate standing due to BLE weakness or sitting up in a chair due to severe back/flank pain.  CIR was recommended due to functional decline    Hospital Course: JEANCARLOS MARCHENA was admitted to rehab 09/04/2018 for inpatient therapies to consist of PT and OT at least three hours five days a week. Past admission physiatrist, therapy team and rehab RN have worked together to provide customized collaborative inpatient rehab. He required max assist with basic self-care task and min assist with bed mobility.  He required multiple rest breaks and was unable to transfer due to decreasing sitting balance as well as issues with dizziness He reported poor appetite as well as dislike of food and was advised to have family bring in food as able.  Nutritional supplements were added however he has refused this.  Megace was added to help  stimulate appetite with minimal results.  Hemodialysis has been ongoing on Monday Wednesday Friday at end of the day to help with tolerance of activity.  Pharmacy has been assisting with management of Coumadin with INR goals of 2-2.5 to avoid rebleed.    Serial CBC has been monitored during the stay and hemoglobin has been in 7 range.  He did drop to 7.0 on 5/29 therefore was transfused with 2 units packed red blood cells with improvement in hemoglobin to 9.1.  Most recent CBC showed rise in white count to 16.9 however no fevers or signs of infection noted.  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  Deep tissue injuries on back have shown breakdown to stage III and are being treated with Aquacel silver and foam dressing.  He continued to have issues  with persistent hypotension worse in the afternoon with fatigue.  There was a question of depressed mood therefore Remeron was added to help with mood as well as appetite.   Pain has continued to be an issue affecting his participation as well as tolerance of therapy.  MS Contin was added for more consistent pain control on 5/27 and titrated up to 30 mg twice daily on 6/1.  He was noted to be sedated on this dose on 6/2.  He is also had some episodes of emesis question due to tramadol therefore this was discontinued.  Discussions were had with patient about his participation, progress as well as overall goals.  Palliative care was consulted to help discuss goals of care.  CT abdomen pelvis was ordered for follow-up on cyst.  He was reported to have mild hypoxia with saturation in mid 80s after 2 L oxygen placed. Patient was in process of being transported to CT when he started throwing up followed by unresponsive. He was pulseless therefore CPR initiated. He was treated with ACLS protocol and defibrillation with return of pulse.  He was intubated and transferred to ICU for further work-up and monitoring..       Current Medications:  . sodium chloride   Intravenous Once  . acetaminophen  650 mg Oral Q6H  . amiodarone  200 mg Oral Daily  . atorvastatin  80 mg Oral q1800  . Chlorhexidine Gluconate Cloth  6 each Topical Q0600  . cinacalcet  30 mg Oral Q supper  . [START ON 09/20/2018] darbepoetin (ARANESP) injection - DIALYSIS  100 mcg Intravenous Q Thu-HD  . feeding supplement (NEPRO CARB STEADY)  237 mL Oral BID BM  . feeding supplement (PRO-STAT SUGAR FREE 64)  30 mL Oral TID  . Gerhardt's butt cream   Topical BID  . lanthanum  1,000 mg Oral TID PC  . megestrol  400 mg Oral BID  . midodrine  15 mg Oral TID WC  . mirtazapine  15 mg Oral QHS  . multivitamin  1 tablet Oral QHS  . Warfarin - Pharmacist Dosing Inpatient   Does not apply q1800    Disposition:  Acute hospital/Intensive  care     Signed: Bary Leriche 09/22/2018, 5:15 PM

## 2018-09-18 NOTE — Progress Notes (Signed)
eLink Physician-Brief Progress Note Patient Name: [name] [name] DOB: Dec 24, 1965 MRN: [id number]   Date of Service  09/30/2018  HPI/Events of Note  Hypotension - BP = 82/53 with MAP = 57. Last Hgb = 11.1.   eICU Interventions  Will order: 1. CVP now and Q 4 hours. 2. ABG STAT.     Intervention Category Major Interventions: Hypotension - evaluation and management  Keiondra Brookover Eugene 09/30/2018, 10:04 PM

## 2018-09-18 NOTE — Progress Notes (Signed)
Pt arrived to [facility] at 2120, pads placed at 2130 & TTM started. Esophageal probe added at that time, no temp foley d/t anuria.   33C reached at 2200.   2200-2300: TTM machine alarm went off multiple times saying "temperature erroneous". Pt temp 30C upon esophageal readjustment.  Esophageal probe changed & rectal probe added. Pt temp 34C after probe change.   Consulted with Gwyndolyn Saxon, RN & Trilby Drummer, RN. Multiple erroneous temperature readings. For clarification, 33C achieved 09/25/2018 2200. Rewarm to start Oct 01, 2018 2200.

## 2018-09-18 NOTE — Progress Notes (Signed)
Pt fatigued today with episodes of nausea and vomiting since this morning. Pt alert and oriented but weak to complete any task. Refused to eat breakfast and lunch. Refused nutritional supplements and fosrenol tablets. This afternoon BP reading 87/59 pulse 113. Pam, PA made aware. Continue plan of care.   Gerald Stabs, RN

## 2018-09-18 NOTE — Progress Notes (Signed)
Pharmacy Antibiotic Note  [name] [name] is a 53 y.o. male admitted on 09/21/2018 with sepsis s/p cardiac arrest.  Pharmacy has been consulted for vancomycin and cefepime dosing. Patient is ESRD usually on TTS schedule, however patient off schedule and was dialyzed on Monday and will likely dialyze on Thursday per renal note.   Plan: Load Vancomycin 2000mg  IV x 1, will schedule doses once back on regular schedule.  Cefepime 1gm IV q24h until back on regular HD schedule Monitor cultures, clinical course, and deescalation.      Temp (24hrs), Avg:98.2 F (36.8 C), Min:98 F (36.7 C), Max:98.5 F (36.9 C)  Recent Labs  Lab 09/13/18 1348 09/14/18 0739 09/15/18 0715 09/16/18 0710 09/17/18 0837 09/25/2018 0727  WBC  --  9.0 9.9 12.3* 16.9* 17.9*  CREATININE 7.53* 4.95*  --   --   --   --     Estimated Creatinine Clearance: 23.3 mL/min (A) (by C-G formula based on SCr of 4.95 mg/dL (H)).    No Known Allergies  Oleg Oleson A. Levada Dy, PharmD, [place] Please utilize [facility] for appropriate phone number to reach the unit pharmacist ([place])   10/01/2018 6:34 PM

## 2018-09-18 NOTE — Progress Notes (Signed)
eLink Physician-Brief Progress Note Patient Name: [name] [name] DOB: 1965-04-22 MRN: [id number]   Date of Service  10/10/2018  HPI/Events of Note  Patient is on a 33 degree therapeutic hypothermia protocol. No order for Neuromuscular blockade.  eICU Interventions  Will rewrite 33 degree therapeutic hypothermia orders.      Intervention Category Major Interventions: Other:  Lysle Dingwall 09/18/2018, 9:21 PM

## 2018-09-18 NOTE — H&P (Signed)
NAME:  [name] [name], MRN:  [id number], DOB:  04/02/1966, LOS: 64 ADMISSION DATE:  09/04/2018, CONSULTATION DATE:  09/22/2018 REFERRING MD:  Code team, CHIEF COMPLAINT:  Cardiac arrest   Brief History   53 year old man initially admitted ith hx of  initially admitted 5/3 to 5/19 with abdominal pain found to have  left sided perinephric hematoma in the setting of supratherapeutic INR and 7 units of PRBC with transcatheter embolization.  Discharged to CIR on 5/19. 6/2 developed worsening flank pain with decompensation to PEA cardiac arrest with one VT s/p defib x 1 and amiodarone with ROSC after 21 mins.  Intubated and transferred to ICU on pressors and PRBC hanging.   History of present illness   HPI obtained from medical chart review as patient is intubated and mechanically ventilated.    53 year old male with extensive history of ESRD on HD TTS (18 years w/at end vascular access), HTN, CAD s/p CABG, combined HF, ESRD on HD, and mechanical AS s/p AVR on coumadin, Anemia, severe protein calorie malnutrition, and Afib.    Initially admitted 4/15- 4/21 for supratherapeutic INR and ruptured hemorrhagic renal cyst.  Then again admitted 5/3 to 5/19 with abdominal pain found to have a have left sided perinephric hematoma in the setting of supratherapeutic INR and 7 units of PRBC with transcatheter embolization on 5/13.  He was discharged to CIR on 5/19.  His hemoglobin dropped in CIR and since been transfused 2 units of PRBC with iHD on 5/29.  On 6/2 he developed worsening flank pain with nausea and vomiting.  Unable to make it for repeat CT abd/ pelvis before he was found unresponsive in CIR, unwitnessed with estimated down time 5 minutes prior to starting CPR.  PEA cardiac arrest with one episode of VT s/p 1 defib and amiodarone.  ROSC obtained after 21 minutes.  Intubated by anesthesia.  Remained unresponsive and transferred to ICU on levophed and PRBC hanging.     Past Medical History  ESRD on HD TTS (18  years w/at end vascular access), HTN, CAD s/p CABG, combined HF, ESRD on HD, and mechanical AS s/p AVR on coumadin, Anemia, severe protein calorie malnutrition, and Afib    Significant Hospital Events   5/3 to 5/19 with abdominal pain found to have a have left sided perinephric hematoma 5/19 To CIR 6/2 cardiac arrest   Consults:  Nephrology PMT  Procedures:  6/2 ETT >> 6/2 R femoral CVL >> 6/2 R femoral aline >>  Significant Diagnostic Tests:  6/2 CT abd/pelvis >>  Micro Data:   Antimicrobials:   Interim history/subjective:  Levophed at 20 mcg/min peripherally with 2 PIV and on second unit of PRBC and 1L NS Bright red blood coming from penis   Objective   Blood pressure (!) 86/48, pulse (!) 115, temperature 98 F (36.7 C), temperature source Oral, resp. rate 20, weight 119.2 kg, SpO2 (!) 88 %.        Intake/Output Summary (Last 24 hours) at 10/02/2018 1721 Last data filed at 10/08/2018 1400 Gross per 24 hour  Intake 30 ml  Output 1100 ml  Net -1070 ml   Filed Weights   09/17/18 1800 09/17/18 2141 09/21/2018 0326  Weight: 120 kg 119 kg 119.2 kg    Examination: General:  Critically ill male unresponsive on mechanical ventilation HEENT: ETT, pupils 4/non-reactive, absent corneals Neuro: unresponsive  CV: RR, mechanical heart sound PULM: even/non-labored, rhonchi in LUL w/wheeze, diminished in bases GI: obese, hard abd,  scattered bruising, scant BS, no foley but some blood at site  Extremities: cool /dry, +1 LE edema , LAVF Skin: no rashes   Resolved Hospital Problem list    Assessment & Plan:  PEA/ one episode of VT cardiac arrest  Shock- unclear at this point - ddx:  suspected ABLA/ re-bleed of prior left sided perinephric hematoma vs aspiration event given N/V P:  tx ICU  Will obtain central access Continue levophed for goal MAP >65, add vasopressin Stat CBC/ CMP/ Mag/ lactic/ coags Trend H/H q 6 Hold further PRBC transfusion (2 since cardiac arrest)  Add  stress dose solucortef CXR pending CT abd/ pelvis when stabilized  EKG/ assess troponin    Acute respiratory insufficiency related to cardiac arrest  R/o possible aspiration event  P:  Full MV support, PRVC 8 cc/kg CXR and ABG now VAP measures Empiric vanc/ cefepime    Acute encephalopathy - concern for anoxic injury given down time P:  Defer TTM due to concern for acute hemorrhage  Serial neuro exams Ongoing GOC - continued with PMT from prior    Leukocytosis  P:  Pan-culture Empiric vanc/ cefepime Trend CBC/ WBC   Mechanical AVR on coumadin - INR 2.7 therapeutic  P:  Hold coumadin  HTN, CAD s/p CABG, combined HF, afib  - on chronic midodrine TID P:  Hold amio, lipitor   ESRD on HD P:  Per Nephrology  Last iHD 6/1  Acute on chronic anemia P:  Pending CBC Transfuse for <7  Best practice:  Diet: NPO Pain/Anxiety/Delirium protocol (if indicated): prn fentanyl/ versed VAP protocol (if indicated): yes DVT prophylaxis: not indicated in active hemorrhage GI prophylaxis: pepcid Glucose control: CBG q 4 Mobility: BR Code Status: Full Family Communication: pending Disposition: ICU  Labs   CBC: Recent Labs  Lab 09/14/18 0739 09/15/18 0715 09/16/18 0710 09/17/18 0837 10/04/2018 0727  WBC 9.0 9.9 12.3* 16.9* 17.9*  HGB 7.0* 8.8* 9.6* 10.2* 9.1*  HCT 23.8* 28.6* 32.9* 35.2* 30.0*  MCV 84.4 83.4 88.2 87.3 83.1  PLT 311 325 273 449* 768    Basic Metabolic Panel: Recent Labs  Lab 09/13/18 1348 09/14/18 0739  NA 134* 137  K 4.2 3.5  CL 94* 98  CO2 24 26  GLUCOSE 93 95  BUN 54* 30*  CREATININE 7.53* 4.95*  CALCIUM 8.5* 8.4*  PHOS 4.4 3.7   GFR: Estimated Creatinine Clearance: 23.3 mL/min (A) (by C-G formula based on SCr of 4.95 mg/dL (H)). Recent Labs  Lab 09/15/18 0715 09/16/18 0710 09/17/18 0837 10/07/2018 0727  WBC 9.9 12.3* 16.9* 17.9*    Liver Function Tests: Recent Labs  Lab 09/13/18 1348 09/14/18 0739  ALBUMIN 1.4* 1.5*    No results for input(s): LIPASE, AMYLASE in the last 168 hours. No results for input(s): AMMONIA in the last 168 hours.  ABG    Component Value Date/Time   PHART 7.409 09/03/2017 1110   PCO2ART 36.4 09/03/2017 1110   PO2ART 67.0 (L) 09/03/2017 1110   HCO3 23.5 09/03/2017 1110   TCO2 29 11/28/2017 0743   ACIDBASEDEF 1.0 09/03/2017 1110   O2SAT 43.2 09/04/2017 0848     Coagulation Profile: Recent Labs  Lab 09/14/18 0739 09/15/18 0715 09/16/18 0844 09/17/18 0837 10/13/2018 0727  INR 2.6* 2.3* 2.3* 3.0* 2.7*    Cardiac Enzymes: No results for input(s): CKTOTAL, CKMB, CKMBINDEX, TROPONINI in the last 168 hours.  HbA1C: Hgb A1c MFr Bld  Date/Time Value Ref Range Status  08/21/2017 11:43 AM 5.0 4.8 -  5.6 % Final    Comment:    (NOTE) Pre diabetes:          5.7%-6.4% Diabetes:              >6.4% Glycemic control for   <7.0% adults with diabetes     CBG: No results for input(s): GLUCAP in the last 168 hours.  Review of Systems:   Unable  Past Medical History  He,  has a past medical history of Aneurysm ([place]), Angina, Chest discomfort, CHF (congestive heart failure) ([place]), Coronary artery disease, Coronary artery disease involving native coronary artery of native heart without angina pectoris, Dialysis patient ([place]), Dysrhythmia, ESRD (end stage renal disease) ([place]), Hypertension, Leg pain, Mitral regurgitation, Morbid obesity ([place]), Orthostatic hypotension, Overweight(278.02), Renal insufficiency, S/P aortic valve replacement with bileaflet mechanical valve (09/01/2017), S/P CABG x 1 (09/01/2017), Severe aortic insufficiency, Sinus tachycardia, and Syncope.   Surgical History    Past Surgical History:  Procedure Laterality Date   A/V FISTULAGRAM Left 06/30/2016   Procedure: A/V Fistulagram;  Surgeon: Algernon Huxley, MD;  Location: [place] [facility];  Service: Cardiovascular;  Laterality: Left;   AORTIC VALVE REPLACEMENT N/A 09/01/2017   Procedure: AORTIC VALVE  REPLACEMENT (AVR);  Surgeon: [name], MD;  Location: [place];  Service: Open Heart Surgery;  Laterality: N/A;   ARTERIOVENOUS GRAFT PLACEMENT     AV FISTULA PLACEMENT     AV FISTULA PLACEMENT Left 11/18/2015   Procedure: ARTERIOVENOUS (AV) FISTULA CREATION ( RADIOCEPHALIC );  Surgeon: Algernon Huxley, MD;  Location: [facility];  Service: Vascular;  Laterality: Left;   AV FISTULA PLACEMENT Left 03/23/2016   Procedure: ARTERIOVENOUS (AV) FISTULA CREATION ( REVISION );  Surgeon: Algernon Huxley, MD;  Location: [facility];  Service: Vascular;  Laterality: Left;   [place] REMOVAL Left 03/23/2016   Procedure: REMOVAL OF ARTERIOVENOUS GORETEX GRAFT ([place]);  Surgeon: Algernon Huxley, MD;  Location: [facility];  Service: Vascular;  Laterality: Left;   CENTRAL LINE INSERTION Right 09/01/2017   Procedure: FLOROSCOPY GUIDED PLACEMENT OF RIGHT FEMORAL CENTRAL LINE TIMES TWO  AND PLACEMENT OF SWAN GANZ CATHETER;  Surgeon: [name], MD;  Location: [place];  Service: Open Heart Surgery;  Laterality: Right;   CORONARY ARTERY BYPASS GRAFT N/A 09/01/2017   Procedure: CORONARY ARTERY BYPASS GRAFTING (CABG) X 1, USING RIGHT GREATER SAPHENOUS VEIN HARVESTED ENDOSCOPICALLY;  Surgeon: [name], MD;  Location: [place];  Service: Open Heart Surgery;  Laterality: N/A;   DIALYSIS/PERMA CATHETER REMOVAL N/A 08/01/2016   Procedure: Dialysis/Perma Catheter Removal;  Surgeon: Algernon Huxley, MD;  Location: [place] [facility];  Service: Cardiovascular;  Laterality: N/A;   HERNIA REPAIR     INSERTION OF DIALYSIS CATHETER  03/01/2011   Procedure: INSERTION OF DIALYSIS CATHETER;  Surgeon: Elam Dutch, MD;  Location: [place];  Service: Vascular;  Laterality: Left;  Exchange of Dialysis Catheter to 27cm 15Fr. Arrow Catheter   INSERTION OF DIALYSIS CATHETER  09/01/2017   Procedure: PLACEMENT OF TRIALYSIS SHORT TERM DIALYSIS CATHETER;  Surgeon: [name], MD;  Location: [place];  Service: Open Heart Surgery;;   IR EMBO ART   VEN HEMORR LYMPH EXTRAV  INC GUIDE ROADMAPPING  08/29/2018   IR FLUORO GUIDE CV LINE RIGHT  08/27/2018   IR RENAL SUPRASEL UNI S&I MOD SED  08/29/2018   IR US GUIDE VASC ACCESS RIGHT  08/27/2018   IR US GUIDE VASC ACCESS RIGHT  08/29/2018   MULTIPLE EXTRACTIONS  WITH ALVEOLOPLASTY N/A 08/07/2017   Procedure: Extraction of tooth #2, 7,8,13,14,15,17, and 31 with alveoloplasty and gross debridement of remaining teeth;  Surgeon: [name], DDS;  Location: [place];  Service: Oral Surgery;  Laterality: N/A;   PERIPHERAL VASCULAR CATHETERIZATION N/A 09/01/2014   Procedure: A/V Shuntogram/Fistulagram;  Surgeon: Algernon Huxley, MD;  Location: [place] [facility];  Service: Cardiovascular;  Laterality: N/A;   PERIPHERAL VASCULAR CATHETERIZATION Right 09/01/2014   Procedure: Thrombectomy;  Surgeon: Algernon Huxley, MD;  Location: [place] [facility];  Service: Cardiovascular;  Laterality: Right;   PERIPHERAL VASCULAR CATHETERIZATION Right 09/01/2014   Procedure: A/V Shunt Intervention;  Surgeon: Algernon Huxley, MD;  Location: [place] [facility];  Service: Cardiovascular;  Laterality: Right;   PERIPHERAL VASCULAR CATHETERIZATION N/A 09/17/2014   Procedure: A/V Shuntogram/Fistulagram;  Surgeon: Algernon Huxley, MD;  Location: [place] [facility];  Service: Cardiovascular;  Laterality: N/A;   PERIPHERAL VASCULAR CATHETERIZATION Right 10/17/2014   Procedure: Thrombectomy;  Surgeon: Katha Cabal, MD;  Location: [place] [facility];  Service: Cardiovascular;  Laterality: Right;   PERIPHERAL VASCULAR CATHETERIZATION N/A 10/17/2014   Procedure: A/V Shuntogram/Fistulagram;  Surgeon: Katha Cabal, MD;  Location: [place] [facility];  Service: Cardiovascular;  Laterality: N/A;   PERIPHERAL VASCULAR CATHETERIZATION N/A 10/17/2014   Procedure: A/V Shunt Intervention;  Surgeon: Katha Cabal, MD;  Location: [place] [facility];  Service: Cardiovascular;  Laterality: N/A;   PERIPHERAL VASCULAR CATHETERIZATION  Right 11/04/2014   Procedure: Thrombectomy;  Surgeon: Katha Cabal, MD;  Location: [place] [facility];  Service: Cardiovascular;  Laterality: Right;   PERIPHERAL VASCULAR CATHETERIZATION Left 11/04/2014   Procedure: Visceral Venography;  Surgeon: Katha Cabal, MD;  Location: [place] [facility];  Service: Cardiovascular;  Laterality: Left;   PERIPHERAL VASCULAR CATHETERIZATION Right 11/27/2014   Procedure: A/V Shuntogram/Fistulagram;  Surgeon: Algernon Huxley, MD;  Location: [place] [facility];  Service: Cardiovascular;  Laterality: Right;   PERIPHERAL VASCULAR CATHETERIZATION Right 11/27/2014   Procedure: A/V Shunt Intervention;  Surgeon: Algernon Huxley, MD;  Location: [place] [facility];  Service: Cardiovascular;  Laterality: Right;   PERIPHERAL VASCULAR CATHETERIZATION Right 12/31/2014   Procedure: Thrombectomy;  Surgeon: Algernon Huxley, MD;  Location: [place] [facility];  Service: Cardiovascular;  Laterality: Right;   PERIPHERAL VASCULAR CATHETERIZATION Right 01/12/2015   Procedure: A/V Shuntogram/Fistulagram;  Surgeon: Algernon Huxley, MD;  Location: [place] [facility];  Service: Cardiovascular;  Laterality: Right;   PERIPHERAL VASCULAR CATHETERIZATION N/A 01/12/2015   Procedure: A/V Shunt Intervention;  Surgeon: Algernon Huxley, MD;  Location: [place] [facility];  Service: Cardiovascular;  Laterality: N/A;   PERIPHERAL VASCULAR CATHETERIZATION Right 01/28/2015   Procedure: Thrombectomy;  Surgeon: Algernon Huxley, MD;  Location: [place] [facility];  Service: Cardiovascular;  Laterality: Right;   PERIPHERAL VASCULAR CATHETERIZATION N/A 06/08/2015   Procedure: A/V Shuntogram/Fistulagram;  Surgeon: Algernon Huxley, MD;  Location: [place] [facility];  Service: Cardiovascular;  Laterality: N/A;   PERIPHERAL VASCULAR CATHETERIZATION N/A 06/08/2015   Procedure: A/V Shunt Intervention;  Surgeon: Algernon Huxley, MD;  Location: [place] [facility];  Service: Cardiovascular;  Laterality: N/A;    PERIPHERAL VASCULAR CATHETERIZATION Right 07/06/2015   Procedure: A/V Shuntogram/Fistulagram;  Surgeon: Algernon Huxley, MD;  Location: [place] [facility];  Service: Cardiovascular;  Laterality: Right;   PERIPHERAL VASCULAR CATHETERIZATION N/A 07/06/2015   Procedure: A/V Shunt Intervention;  Surgeon: Algernon Huxley, MD;  Location: [place] [facility];  Service: Cardiovascular;  Laterality: N/A;   PERIPHERAL VASCULAR CATHETERIZATION N/A 08/04/2015   Procedure: graft declot;  Surgeon: Katha Cabal, MD;  Location: [place] [facility];  Service: Cardiovascular;  Laterality: N/A;   PERIPHERAL VASCULAR CATHETERIZATION Right 08/25/2015   Procedure: A/V Shuntogram/Fistulagram;  Surgeon: Katha Cabal, MD;  Location: [place] [facility];  Service: Cardiovascular;  Laterality: Right;   PERIPHERAL VASCULAR CATHETERIZATION N/A 11/04/2015   Procedure: Dialysis/Perma Catheter Insertion;  Surgeon: Algernon Huxley, MD;  Location: [place] [facility];  Service: Cardiovascular;  Laterality: N/A;   PERIPHERAL VASCULAR CATHETERIZATION Left 12/14/2015   Procedure: A/V Shuntogram/Fistulagram;  Surgeon: Algernon Huxley, MD;  Location: [place] [facility];  Service: Cardiovascular;  Laterality: Left;   PERIPHERAL VASCULAR CATHETERIZATION N/A 12/14/2015   Procedure: A/V Shunt Intervention;  Surgeon: Algernon Huxley, MD;  Location: [place] [facility];  Service: Cardiovascular;  Laterality: N/A;   PERIPHERAL VASCULAR CATHETERIZATION N/A 12/17/2015   Procedure: Dialysis/Perma Catheter Insertion;  Surgeon: Algernon Huxley, MD;  Location: [place] [facility];  Service: Cardiovascular;  Laterality: N/A;   PERIPHERAL VASCULAR CATHETERIZATION Left 12/22/2015   Procedure: Dialysis/Perma Catheter Insertion;  Surgeon: Katha Cabal, MD;  Location: [place] [facility];  Service: Cardiovascular;  Laterality: Left;   PERIPHERAL VASCULAR CATHETERIZATION Left 02/29/2016   Procedure: A/V Shuntogram/Fistulagram;  Surgeon: Algernon Huxley, MD;   Location: [place] [facility];  Service: Cardiovascular;  Laterality: Left;   PERIPHERAL VASCULAR CATHETERIZATION N/A 02/29/2016   Procedure: A/V Shunt Intervention;  Surgeon: Algernon Huxley, MD;  Location: [place] [facility];  Service: Cardiovascular;  Laterality: N/A;   POCKET REVISION/RELOCATION N/A 01/01/2018   Procedure: POCKET REVISION/RELOCATION;  Surgeon: Evans Lance, MD;  Location: [place] [facility];  Service: Cardiovascular;  Laterality: N/A;   right arm graft     for dyalisis   RIGHT/LEFT HEART CATH AND CORONARY ANGIOGRAPHY N/A 08/02/2017   Procedure: RIGHT/LEFT HEART CATH AND CORONARY ANGIOGRAPHY;  Surgeon: Larey Dresser, MD;  Location: [place] [facility];  Service: Cardiovascular;  Laterality: N/A;   SUBQ ICD IMPLANT N/A 11/28/2017   Procedure: SUBQ ICD IMPLANT;  Surgeon: Evans Lance, MD;  Location: [place] [facility];  Service: Cardiovascular;  Laterality: N/A;   SUBQ ICD REVISION N/A 02/12/2018   Procedure: SUBQ ICD REVISION(REMOVAL);  Surgeon: Evans Lance, MD;  Location: [place] [facility];  Service: Cardiovascular;  Laterality: N/A;   TEE WITHOUT CARDIOVERSION N/A 07/31/2017   Procedure: TRANSESOPHAGEAL ECHOCARDIOGRAM (TEE);  Surgeon: Josue Hector, MD;  Location: [facility];  Service: Cardiovascular;  Laterality: N/A;   TEE WITHOUT CARDIOVERSION N/A 09/01/2017   Procedure: TRANSESOPHAGEAL ECHOCARDIOGRAM (TEE);  Surgeon: [name], MD;  Location: [place];  Service: Open Heart Surgery;  Laterality: N/A;   THROMBECTOMY     THROMBECTOMY W/ EMBOLECTOMY  03/01/2011   Procedure: THROMBECTOMY ARTERIOVENOUS GORE-TEX GRAFT;  Surgeon: Elam Dutch, MD;  Location: [place];  Service: Vascular;  Laterality: Right;  Attempted Thrombectomy of Old  Right Upper Arm Arteriovenous gortex Graft. Insertion of new Arteriovenous Graft using 94mm x 50cm Gortex Stretch graft.    THROMBECTOMY W/ EMBOLECTOMY  07/11/2011   Procedure: THROMBECTOMY ARTERIOVENOUS GORE-TEX GRAFT;   Surgeon: Rosetta Posner, MD;  [facility];  Service: Vascular;  Laterality: Right;   UMBILICAL HERNIA REPAIR     VENOGRAM N/A 08/23/2011   Procedure: VENOGRAM;  Surgeon: [name], MD;  Location: [place] [facility];  Service: Cardiovascular;  Laterality: N/A;     Social History   reports that he quit smoking about 24 years ago. His smoking use included cigarettes. His smokeless tobacco use includes snuff. He reports that he does not drink alcohol or use drugs.   Family History   His family history includes Heart disease in his father and mother; Hypertension in his father and mother.   Allergies No Known Allergies   Home Medications  Prior to Admission medications   Medication Sig Start Date End Date Taking? Authorizing Provider  amiodarone (PACERONE) 200 MG tablet Take 1 tablet (200 mg total) by mouth daily. Patient taking differently: Take 200 mg by mouth at bedtime.  01/16/18   Evans Lance, MD  atorvastatin (LIPITOR) 80 MG tablet Take 1 tablet (80 mg total) by mouth daily at 6 PM. Patient taking differently: Take 80 mg by mouth daily.  10/31/17   Larey Dresser, MD  Cinacalcet HCl (SENSIPAR PO) Take 1 tablet by mouth daily.    [provider]  lanthanum (FOSRENOL) 500 MG chewable tablet Chew 500-1,000 mg by mouth See admin instructions. Take 2 tablets (1000 mg) by mouth three times daily with meals, take 1 tablet (500 mg) with snacks    [provider]  midodrine (PROAMATINE) 5 MG tablet Take 3 tablets (15 mg total) by mouth 3 (three) times daily with meals. Patient taking differently: Take 5 mg by mouth daily.  01/09/18   Evans Lance, MD  warfarin (COUMADIN) 1 MG tablet Take 1 tablet (1 mg total) by mouth every other day for 30 days. 09/05/18 10/05/18  [name], DO     Critical care time: 15 mins    Kennieth Rad, MSN, AGACNP-BC [facility] Pgr: [phone] or if no answer [phone] 10/15/2018, 6:30 PM

## 2018-09-18 NOTE — Procedures (Signed)
Central Venous Catheter Insertion Procedure Note CHINONSO LINKER [id number] 1965-09-15  Procedure: Insertion of Central Venous Catheter w US guidance Indications: Drug and/or fluid administration  Procedure Details Consent: Unable to obtain consent because of emergent medical necessity. Time Out: Verified patient identification, verified procedure, site/side was marked, verified correct patient position, special equipment/implants available, medications/allergies/relevent history reviewed, required imaging and test results available.  Performed  Maximum sterile technique was used including antiseptics, cap, gloves, gown, hand hygiene, mask and sheet. Skin prep: Chlorhexidine; local anesthetic administered A antimicrobial bonded/coated triple lumen catheter was placed in the right femoral vein due to emergent situation using the Seldinger technique.  Evaluation Blood flow good Complications: No apparent complications Patient did tolerate procedure well. Chest X-ray ordered to verify placement.  CXR: normal.  [name] 10/11/2018, 7:42 PM

## 2018-09-18 NOTE — Progress Notes (Signed)
Occupational Therapy Weekly Progress Note  Patient Details  Name: [name] [name] MRN: [id number] Date of Birth: [birth year]  Beginning of progress report period: Sep 11, 2018 End of progress report period: September 18, 2018  Patient has met 0 of 4 short term goals.  Pt currently no tolerating OOB activities during OT sessions.  Pt requires min/mod A for supine>sit EOB and tolerates sitting EOB for approx 5 mins.  Pt continues to c/o increased pain with bed level and EOB activities which is currently inhibiting pt's ability to actively participate in therapies.  Pt has been changed to 15/7 after consultation with OTR and primary PT. LTGs to be downgraded. Patient continues to demonstrate the following deficits: muscle weakness and acute pain, decreased cardiorespiratoy endurance and decreased standing balance and decreased balance strategies and therefore will continue to benefit from skilled OT intervention to enhance overall performance with BADL and Reduce care partner burden.  Patient progressing toward long term goals..  Continue plan of care.  OT Short Term Goals Week 2:  OT Short Term Goal 1 (Week 2): Pt will tolerate sitting upright out of bed for 3 hrs a day. OT Short Term Goal 1 - Progress (Week 2): Progressing toward goal OT Short Term Goal 2 (Week 2): Pt will perform sit to stand with mod A +1 for LB dressing  OT Short Term Goal 2 - Progress (Week 2): Revised due to lack of progress OT Short Term Goal 3 (Week 2): Pt will perform lateral transfers (scoot or squat) with min A  OT Short Term Goal 3 - Progress (Week 2): Discontinued (comment) OT Short Term Goal 4 (Week 2): Pt will LB dressing with mod A (sit to stand). OT Short Term Goal 4 - Progress (Week 2): Revised due to lack of progress Week 3:  OT Short Term Goal 1 (Week 3): Pt will tolerate sitting upright out of bed for 3 hrs a day. OT Short Term Goal 2 (Week 3): Pt will perform LB dressing with mod A at bed level OT Short Term Goal 3  (Week 3): Pt will perform sit<>stand from EOB with max +1        [name] [name] 10/03/2018, 6:53 AM

## 2018-09-18 NOTE — Consult Note (Signed)
Consultation Note Date: 09/23/2018   Patient Name: [name] [name]  DOB: 05-Aug-1965  MRN: [id number]  Age / Sex: 53 y.o., male  PCP: Patient, No Pcp Per Referring Physician: Meredith Staggers, MD  Reason for Consultation: Establishing goals of care  HPI/Patient Profile: 53 y.o. male  with past medical history of ESRD dt polycystic kidney disease with HD for 18 years, HTN, CAD/AS sp CABG with mechanical aortic valve replacement chronic coumadin, CHF, obesity,  admitted on 09/04/2018 with L perinephric hematoma, FTT. He was transferred to inpatient rehab and has been declining with continued FTT.  PMT consulted for [place].   Clinical Assessment and Goals of Care: [name] [name] is resting quietly in bed.  He appears acutely/chronically ill and frail.  He will make and somewhat keep eye contact.  There is no family at bedside at this time dt visitor restrictions.  Dr. Moshe Cipro, nephrology is at bedside and share that more images of abdomen to evaluate his pain.   Dr. Moshe Cipro and I look at his abdomen, the site is relatively clean and dry, with hard fat underneath.  This area is sore. We talk about increased WBC.  [name] [name] "[name] [name]" and I tallk about his acute and chronic health issues. He tells me that he has been on HD for 18 years.  We talk about his access and he tells me that he is now with his last spot for vascular access.  He appears quite frail and is unable to fully complete in healthcare discussion at this time.   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  We talk about Code status, see below.  Palliative Medicine team will continue to follow.   HCPOA   NEXT OF KIN Names his wife of 31 years Valinda as his HC surrogate.    SUMMARY OF RECOMMENDATIONS   At this point Full scope/Full code Continue code status discussions.   Code Status/Advance Care Planning:  Full code -  We talk about the concept of treat the  treatable, but allowing a natural death".   I share that if we are checking labs, images, giving meds and still he worsens ... I believe that CPR/life support would not change things for him.  If he were to survive, he would still have all of his same health issues. He nods yes, and I encourage him to consider "treat the treatable, but allow a natural death".   Symptom Management:   Per hospitalist, no additional needs at this time.   Palliative Prophylaxis:   Frequent Pain Assessment and Palliative Wound Care  Additional Recommendations (Limitations, Scope, Preferences):  Full Scope Treatment  Psycho-social/Spiritual:   Desire for further Chaplaincy support:no  Additional Recommendations: Caregiving  Support/Resources and Education on Hospice  Prognosis:   Unable to determine, based on outcomes.  Difficult prognostication for ESRD.  Of course, 2 weeks or less if he chooses to stop HD.    Discharge Planning: to be determined, based on outcomes. At this point, anticipate home.       Primary  Diagnoses: Present on Admission: . Debility   I have reviewed the medical record, interviewed the patient and family, and examined the patient. The following aspects are pertinent.  Past Medical History:  Diagnosis Date  . Aneurysm ([place])     Right arm fistula 3 aneurysms 2011,   plans to have a new procedure  . Angina   . Chest discomfort   . CHF (congestive heart failure) ([place])   . Coronary artery disease   . Coronary artery disease involving native coronary artery of native heart without angina pectoris   . Dialysis patient ([place])   . Dysrhythmia   . ESRD (end stage renal disease) ([place])    on hemodialysis T_T_S  . Hypertension   . Leg pain   . Mitral regurgitation   . Morbid obesity ([place])   . Orthostatic hypotension   . Overweight(278.02)   . Renal insufficiency   . S/P aortic valve replacement with bileaflet mechanical valve 09/01/2017   25 mm Sorin [employer] [place]  bileaflet mechanical valve  . 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 Severe aortic insufficiency   . Sinus tachycardia   . Syncope     positional after dialysis... 2008   Social History   Socioeconomic History  . Marital status: Married    Spouse name: Not on file  . Number of children: Not on file  . Years of education: Not on file  . Highest education level: Not on file  Occupational History  . Occupation: disabled  Social Needs  . Financial resource strain: Not on file  . Food insecurity:    Worry: Not on file    Inability: Not on file  . Transportation needs:    Medical: Not on file    Non-medical: Not on file  Tobacco Use  . Smoking status: Former Smoker    Types: Cigarettes    Last attempt to quit: 04/18/1994    Years since quitting: 24.4  . Smokeless tobacco: Current User    Types: Snuff  . Tobacco comment: dips 1/2 snuff per day times 25 years  Substance and Sexual Activity  . Alcohol use: No  . Drug use: No  . Sexual activity: Not on file  Lifestyle  . Physical activity:    Days per week: Not on file    Minutes per session: Not on file  . Stress: Not on file  Relationships  . Social connections:    Talks on phone: Not on file    Gets together: Not on file    Attends religious service: Not on file    Active member of club or organization: Not on file    Attends meetings of clubs or organizations: Not on file    Relationship status: Not on file  Other Topics Concern  . Not on file  Social History Narrative  . Not on file   Family History  Problem Relation Age of Onset  . Hypertension Mother   . Heart disease Mother   . Hypertension Father   . Heart disease Father    Scheduled Meds: . sodium chloride   Intravenous Once  . acetaminophen  650 mg Oral Q6H  . amiodarone  200 mg Oral Daily  . atorvastatin  80 mg Oral q1800  . Chlorhexidine Gluconate Cloth  6 each Topical Q0600  . cinacalcet  30 mg Oral Q supper  . [START ON 09/20/2018] darbepoetin  (ARANESP) injection - DIALYSIS  100 mcg Intravenous Q Thu-HD  .  feeding supplement (NEPRO CARB STEADY)  237 mL Oral BID BM  . feeding supplement (PRO-STAT SUGAR FREE 64)  30 mL Oral TID  . Gerhardt's butt cream   Topical BID  . lanthanum  1,000 mg Oral TID PC  . megestrol  400 mg Oral BID  . midodrine  15 mg Oral TID WC  . mirtazapine  15 mg Oral QHS  . multivitamin  1 tablet Oral QHS  . Warfarin - Pharmacist Dosing Inpatient   Does not apply q1800   Continuous Infusions: . sodium thiosulfate infusion for calciphylaxis Stopped (09/11/18 1713)   PRN Meds:.acetaminophen, alum & mag hydroxide-simeth, bisacodyl, diphenhydrAMINE, guaiFENesin-dextromethorphan, lanthanum, oxyCODONE, polyethylene glycol, prochlorperazine **OR** prochlorperazine **OR** prochlorperazine, sodium chloride flush, traZODone Medications Prior to Admission:  Prior to Admission medications   Medication Sig Start Date End Date Taking? Authorizing Provider  amiodarone (PACERONE) 200 MG tablet Take 1 tablet (200 mg total) by mouth daily. Patient taking differently: Take 200 mg by mouth at bedtime.  01/16/18   Evans Lance, MD  atorvastatin (LIPITOR) 80 MG tablet Take 1 tablet (80 mg total) by mouth daily at 6 PM. Patient taking differently: Take 80 mg by mouth daily.  10/31/17   Larey Dresser, MD  Cinacalcet HCl (SENSIPAR PO) Take 1 tablet by mouth daily.    [provider]  lanthanum (FOSRENOL) 500 MG chewable tablet Chew 500-1,000 mg by mouth See admin instructions. Take 2 tablets (1000 mg) by mouth three times daily with meals, take 1 tablet (500 mg) with snacks    [provider]  midodrine (PROAMATINE) 5 MG tablet Take 3 tablets (15 mg total) by mouth 3 (three) times daily with meals. Patient taking differently: Take 5 mg by mouth daily.  01/09/18   Evans Lance, MD  warfarin (COUMADIN) 1 MG tablet Take 1 tablet (1 mg total) by mouth every other day for 30 days. 09/05/18 10/05/18  [place], Nila Nephew  D, DO   No Known Allergies Review of Systems  Unable to perform ROS: Acuity of condition    Physical Exam Vitals signs and nursing note reviewed.  Constitutional:      General: He is not in acute distress.    Appearance: He is ill-appearing.  Cardiovascular:     Rate and Rhythm: Normal rate.  Pulmonary:     Effort: Pulmonary effort is normal. No respiratory distress.  Abdominal:     General: Abdomen is flat.     Tenderness: There is abdominal tenderness. There is no guarding.  Musculoskeletal:     Comments: Multiple old graft   Skin:    General: Skin is warm and dry.  Neurological:     General: No focal deficit present.     Mental Status: He is alert and oriented to person, place, and time.  Psychiatric:     Comments: Calm, not fearful.      Vital Signs: BP (!) 87/59 (BP Location: Right Arm) Comment: PA made aware  Pulse (!) 113 Comment: PA made aware  Temp 98.5 F (36.9 C) (Oral)   Resp 20   Wt 119.2 kg   SpO2 93%   BMI 34.67 kg/m  Pain Scale: 0-10 POSS *See Group Information*: 1-Acceptable,Awake and alert Pain Score: 9    SpO2: SpO2: 93 % O2 Device:SpO2: 93 % O2 Flow Rate: .   IO: Intake/output summary:   Intake/Output Summary (Last 24 hours) at 10/12/2018 1608 Last data filed at 09/18/2018 1400 Gross per 24 hour  Intake 30 ml  Output 1100 ml  Net -1070 ml    LBM: Last BM Date: 09/17/2018 Baseline Weight: Weight: 128.2 kg Most recent weight: Weight: 119.2 kg     Palliative Assessment/Data:   Flowsheet Rows     Most Recent Value  Intake Tab  Referral Department  Hospitalist  Unit at Time of Referral  Other (Comment)  Palliative Care Primary Diagnosis  Nephrology  Date Notified  09/20/2018  Palliative Care Type  New Palliative care  Reason for referral  Clarify Goals of Care  Date of Admission  09/04/18  Date first seen by Palliative Care  09/20/2018  # of days Palliative referral response time  0 Day(s)  # of days IP prior to Palliative referral  14   Clinical Assessment  Palliative Performance Scale Score  50%  Psychosocial & Spiritual Assessment  Palliative Care Outcomes      Time In: 1320 Time Out: 1400 Time Total: 40 minutes  Greater than 50%  of this time was spent counseling and coordinating care related to the above assessment and plan.  Signed by: Drue Novel, NP   Please contact Palliative Medicine Team phone at [phone] for questions and concerns.  For individual provider: See Shea Evans

## 2018-09-18 NOTE — Progress Notes (Addendum)
At 1617, vitals assessed, O2 sat noted to be 88% on room air. O2 applied at 2L/min per nasal cannula. Writer left pt's room to notify PA or pt's O2 sat. Around 20 mins later transport was in room preparing pt to transport him to CT. Around 1638, transport called for nurse. Writer walked into room and found pt to be unresponsive with vomitus. CPR initiated immediately. Rapid response called. After approximately 20 min, pulse returned. Pt transported to 3M12 with code team. Report given to receiving nurse. Family is aware.     Gerald Stabs, RN

## 2018-09-18 NOTE — Progress Notes (Signed)
Occupational Therapy Session Note  Patient Details  Name: [name] [name] MRN: [id number] Date of Birth: 04/09/1966  Today's Date: 10/02/2018 OT Individual Time: 0815-0900 OT Individual Time Calculation (min): 45 min  and Today's Date: 10/09/2018 OT Missed Time: 30 Minutes Missed Time Reason: Nursing care;Pain;Other (comment)(n/v)   Short Term Goals: Week 3:  OT Short Term Goal 1 (Week 3): Pt will tolerate sitting upright out of bed for 3 hrs a day. OT Short Term Goal 2 (Week 3): Pt will perform LB dressing with mod A at bed level OT Short Term Goal 3 (Week 3): Pt will perform sit<>stand from EOB with max +1  Skilled Therapeutic Interventions/Progress Updates:    Pt resting in bed upon arrival.  Pt commented that he had vomited earlier in the morning although he had not eaten anything since previous night.  OT intervention with focus on bed mobility and repositioning.  Pt declined OOB activities.  Pt required min A for rolling R<>L using bed rails.  Pt c/o increased pain with activity.  Pt required multiple rest breaks between each bed level activity.  Pt unable to continue 2/2 pain and nausea and missed 30 mins skilled OT services. Pt remained in bed with all needs within reach.  Therapy Documentation Precautions:  Precautions Precautions: Fall Precaution Comments: pain along flanks and abdomen; ice as needed Restrictions Weight Bearing Restrictions: No General: General OT Amount of Missed Time: 30 Minutes   Pain: Pain Assessment Pain Scale: 0-10 Pain Score: 9 ; back and abdomen repositioned   Therapy/Group: Individual Therapy  [name] 10/09/2018, 9:18 AM

## 2018-09-18 NOTE — Patient Care Conference (Signed)
Inpatient RehabilitationTeam Conference and Plan of Care Update Date: 10/14/2018   Time: 1:45 PM    Patient Name: [name] [name]      Medical Record Number: [id number]  Date of Birth: Nov 16, 1965 Sex: Male         Room/Bed: 4W19C/4W19C-01 Payor Info: Payor: MEDICARE / Plan: MEDICARE PART A AND B / Product Type: *No Product type* /    Admitting Diagnosis: Overflow Team  Debility, malaise; 16-19days  Admit Date/Time:  09/04/2018  3:50 PM Admission Comments: No comment available   Primary Diagnosis:  <principal problem not specified> Principal Problem: <principal problem not specified>  Patient Active Problem List   Diagnosis Date Noted  . Labile blood pressure   . Mass   . Hemodialysis-associated hypotension   . Anemia of chronic disease   . Generalized abdominal pain   . Debility 09/04/2018  . Perinephric hematoma 08/20/2018  . Supratherapeutic INR 08/20/2018  . Gross hematuria 08/19/2018  . Acute blood loss anemia 08/02/2018  . Renal hematoma, left, initial encounter 08/01/2018  . 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 ICD (implantable cardioverter-defibrillator) pocket hematoma 01/01/2018  . Encounter for therapeutic drug monitoring 09/14/2017  . S/P aortic valve replacement with bileaflet mechanical valve 09/01/2017  . S/P CABG x 1 09/01/2017  . Coronary artery disease involving native coronary artery of native heart without angina pectoris   . 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 Abnormal echocardiogram   . Mitral regurgitation   . Severe aortic insufficiency   . Acute on chronic combined systolic and diastolic heart failure ([place])   . Systolic CHF ([place]) 93/57/0177  . 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 Dermatitis 04/06/2016  . End stage renal disease ([place]) 12/22/2011  . Sinus tachycardia   . ESRD (end stage renal disease) ([place])   . Orthostatic hypotension   . Chest discomfort   . Morbid obesity ([place])   . Syncope    . Aneurysm [facility])     Expected Discharge Date: Expected Discharge Date: (anticipate SNF)  Team Members Present: Physician leading conference: Dr. Alger Simons Social Worker Present: Lennart Pall, LCSW Nurse Present: Dwaine Gale, RN PT Present: Other (comment)(Cherie Grunenberg, PT) OT Present: Willeen Cass, OT;Roanna Epley, COTA PPS Coordinator present : Gunnar Fusi     Current Status/Progress Goal Weekly Team Focus  Medical   failure to thrive. ongoing wound care. poor nutrition, pain, calciphylaxis  pain, skin , nutrition  establish goals of care   Bowel/Bladder   Incon of Bowel   Have less incon episodes.  Work on less bowel incon.   Swallow/Nutrition/ Hydration             ADL's   UB bathing/dressing-CGA EOB; LB bathing/dressing at bed level-max A; slide board tranfsers-min A; OOB time has been limited secondary to pain and pt's unwillingness to get OOB  supervision overall  OOB tolerance, activity tolerance, BADL training OOB, sitting balance, sit<>stand, discharge planning   Mobility   Max supine<>sit, +2 slide board transfers, tolerates OOB 20-30 min  Supervision bed mobility, basic transfers, and w/c x50', min A car transfers  Work on activity tolerance, OOB and upright tolerance, bed mobility, basic transfers, car transfers, sitting balance, strengthening, pt/family education   Communication             Safety/Cognition/ Behavioral Observations            Pain             Skin   Cleanse left and right  flank and right ischial wound with NS and pat dry.  Apply Aquacel Ag to open areas and cover with foam dressings.  Change M/W/F.  Apply Gerhardts butt paste to buttocks and sacral breakdown. Twice daily and PRN soilage.  Keep patient clean and dry and from further break down.   Keep patient clean and dry and from further break down.     Rehab Goals Patient on target to meet rehab goals: No Rehab Goals Revised: minimal progress and ongoing medical issues *See Care  Plan and progress notes for long and short-term goals.     Barriers to Discharge  Current Status/Progress Possible Resolutions Date Resolved   Physician    Medical stability        establish goals of care      Nursing                  PT  Medical stability;Inaccessible home environment  6 steps to enter home, PT limited by nausea/emisis and abdominal pain, OOB tolerance limited by sacral and back wounds  Needs to have a ramp put in, doungraded LTGs and changed to 15/7 this week           OT                  SLP                SW                Discharge Planning/Teaching Needs:  Will discuss care needs further with pt and wife as his LOC may be too great for family to manage - may need to consider SNF.  Teaching needs to be determined closer to d/c if plan is home.   Team Discussion:  MD to refer for Palliative Care as medical issues continue to be a concern.  RD suggesting possible tube feeds, however, pt declines.  Pain with area on skin and stage 2 on bottom.  MD questions if anxiety and depression may be present.  No progress this week.  Max +2 for transfers OOB.  PT recommending vitalift bed.  Decreasing schedule to 15/7.  SW to discuss possible SNF with pt and his wife.  Revisions to Treatment Plan:  15/7 schedule.  D/c plan may change as well.    Continued Need for Acute Rehabilitation Level of Care: The patient requires daily medical management by a physician with specialized training in physical medicine and rehabilitation for the following conditions: Daily direction of a multidisciplinary physical rehabilitation program to ensure safe treatment while eliciting the highest outcome that is of practical value to the patient.: Yes Daily medical management of patient stability for increased activity during participation in an intensive rehabilitation regime.: Yes Daily analysis of laboratory values and/or radiology reports with any subsequent need for medication adjustment of  medical intervention for : Wound care problems   I attest that I was present, lead the team conference, and concur with the assessment and plan of the team.   Lennart Pall 10/05/2018, 3:47 PM     Team conference was held via web/ teleconference due to [place] - 19

## 2018-09-18 NOTE — Progress Notes (Signed)
Occupational Therapy Session Note  Patient Details  Name: [name] [name] MRN: [id number] Date of Birth: 1966-03-20  Today's Date: 09/25/2018 OT Individual Time: 1115-1130 OT Individual Time Calculation (min): 15 min  and Today's Date: 09/17/2018 OT Missed Time: 30 Minutes Missed Time Reason: Pain;Other (comment)(n/v)   Short Term Goals: Week 3:  OT Short Term Goal 1 (Week 3): Pt will tolerate sitting upright out of bed for 3 hrs a day. OT Short Term Goal 2 (Week 3): Pt will perform LB dressing with mod A at bed level OT Short Term Goal 3 (Week 3): Pt will perform sit<>stand from EOB with max +1  Skilled Therapeutic Interventions/Progress Updates:    Pt resting in bed upon arrival.  Pt c/o ongoing n/v this morning and increased pain.  Pt declined OOB activities. Pt required mod A for rolling in bed for repositioning.  Pt missed 30 mins skilled OT services 2/2 n/v and pain.    Therapy Documentation Precautions:  Precautions Precautions: Fall Precaution Comments: pain along flanks and abdomen; ice as needed Restrictions Weight Bearing Restrictions: No General: General OT Amount of Missed Time: 30 Minutes   Pain: Pain Assessment Pain Scale: 0-10 Pain Score: 9  Pain Type: Acute pain Pain Location: Abdomen Pain Orientation: Lateral Pain Radiating Towards: flank Pain Descriptors / Indicators: Aching;Tender Pain Frequency: Constant Pain Onset: On-going Pain Intervention(s): Medication (See eMAR)(morphine po) Multiple Pain Sites: Yes 2nd Pain Site Pain Score: 9 Pain Type: Acute pain Pain Location: Buttocks Pain Orientation: Medial;Lateral Pain Descriptors / Indicators: Burning Pain Frequency: Constant Pain Onset: On-going Patient's Stated Pain Goal: 3 Pain Intervention(s): ;Repositioned   Therapy/Group: Individual Therapy  [name] 10/06/2018, 11:40 AM

## 2018-09-18 NOTE — Progress Notes (Addendum)
Nutrition Follow-up  RD working remotely.  DOCUMENTATION CODES:   Severe malnutrition in context of acute illness/injury, Obesity unspecified  INTERVENTION:   Pt with severe malnutrition, continued weight loss, and multiple non-healing wounds. Poor PO intake continues. Wounds unlikely to heal without aggressive nutrition interventions. Recommend reconsidering nutrition support.  - Recommend diet liberalization to Regular to promote increased po intake  - ContinueNepro Shake poBID, each supplement provides 425 kcal and 19 grams protein  - Continue Pro-stat 30 ml TID, each supplement provides 100 kcal and 15 grams of protein  - Continue renal MVI daily  - AddMagic cupBID with meals, each supplement provides 290 kcal and 9 grams of protein  - Add double protein portions TID with all meals  - Encourage adequate PO intake  NUTRITION DIAGNOSIS:   Severe Malnutrition related to acute illness as evidenced by energy intake < or equal to 50% for > or equal to 5 days, percent weight loss (4.1% weight loss in less than 1 week).  Ongoing  GOAL:   Patient will meet greater than or equal to 90% of their needs  Unmet  MONITOR:   PO intake, Supplement acceptance, Labs, Weight trends, I & O's, Skin  REASON FOR ASSESSMENT:   Malnutrition Screening Tool, Consult Poor PO  ASSESSMENT:   53 year old male with PMH of ESRD due to polycystic kidney disease on HD TTS, HTN, CAD/AS s/p CABG with mechanical AVR on chronic coumadin. Pt with recent admission 4/15-4/21 forsupratherapeutic INR andruptured hemorrhagic renal cyst. Pt was readmitted on 08/19/18 with tachycardia, fall a few days PTA with difficulty walking and inability to get to HD, LUQ and flank pain radiating to back with hematuria.CT abdomen at OSH showed increase in size of perinephric hematoma and INR noted to be supratherapeutic. Pt underwent left renal embolization on 5/13. Pt admitted to CIR on 5/19.  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 Per Nephrology, "still suspicious and will continue with current treatment and consider repeat biopsy to another site."  Noted pt refused breakfast this AM. Weight down 4 kg x 1 week.  Called pt's room between scheduled therapy sessions but pt did not answer.  Pt with severe malnutrition. Weight continues to trend down. PO intake remains very poor with multiple skipped meals. Pt with multiple non-healing wounds. Recommend reconsidering nutrition support.  Reviewed RN edema assessment. Pt with non-pitting edema to BLE.  Post-HD weight 6/01: 119 kg  Meal Completion: 0-50% x last 8 recorded meals (averaging 16% meal completion)  Medications reviewed and include: Sensipar, Nepro BID, Pro-stat 30 ml TID, Fosrenol, Megace 400 mg BID, rena-vit, warfarin, sodium thiosulfate 25 grams every T-Th-Sa  Labs reviewed: hemoglobin 9.1 (L)  Net UF 6/01: 1000 ml x 24 hours I/O's: -7.7 L since admit  Diet Order:   Diet Order            Diet renal with fluid restriction Fluid restriction: 1200 mL Fluid; Room service appropriate? Yes; Fluid consistency: Thin  Diet effective now              EDUCATION NEEDS:   Not appropriate for education at this time  Skin:  Skin Assessment: Skin Integrity Issues: Stage II: right buttocks (per WOC note, stage III) Incisions: right groin, chest Other: blood blister to left flank, open blisters to back, puncture wound to abdoment from biopsy  Last BM:  09/21/2018  Height:   Ht Readings from Last 1 Encounters:  08/21/18 6\' 1"  (1.854 m)    Weight:  Wt Readings from Last 1 Encounters:  10/02/2018 119.2 kg    Ideal Body Weight:  83.6 kg  BMI:  Body mass index is 34.67 kg/m.  Estimated Nutritional Needs:   Kcal:  2400-2600  Protein:  120-135 grams  Fluid:  UOP + 1000 ml    Gaynell Face, MS, RD, LDN Inpatient Clinical Dietitian Pager: [phone] Weekend/After Hours: [phone]

## 2018-09-18 NOTE — Progress Notes (Signed)
Physical Therapy Note  Patient Details  Name: [name] [name] MRN: [id number] Date of Birth: Apr 04, 1966 Today's Date: 10/06/2018    Attempted PT treatment session this morning. Patient had 2 bouts of emisis upon entry. Patient reported having nausea and vomiting since he woke up this morning, RN made aware. Patient refused PT at this time due to emisis. PT will re-attempt at a later time if able. Patient missed 60 minutes skilled PT.  Chasitty Hehl L Kajuana Shareef PT, DPT  10/04/2018, 12:07 PM

## 2018-09-18 NOTE — Progress Notes (Signed)
ANTICOAGULATION CONSULT NOTE  Pharmacy Consult for warfarin Indication: Mechanical valve  No Known Allergies  Patient Measurements: Weight: 262 lb 13 oz (119.2 kg)  Vital Signs: Temp: 98.5 F (36.9 C) (06/02 1355) Temp Source: Oral (06/02 1355) BP: 87/59 (06/02 1407) Pulse Rate: 113 (06/02 1407)  Labs: Recent Labs    09/16/18 0710 09/16/18 0844 09/17/18 0837 09/21/2018 0727  HGB 9.6*  --  10.2* 9.1*  HCT 32.9*  --  35.2* 30.0*  PLT 273  --  449* 352  LABPROT  --  25.2* 30.4* 28.2*  INR  --  2.3* 3.0* 2.7*   Estimated Creatinine Clearance: 23.3 mL/min (A) (by C-G formula based on SCr of 4.95 mg/dL (H)).  Assessment: [name] [name] on warfarin PTA for mech aortic valve. Recent admission 4/15-4/21 forsupratherapeutic INR andruptured hemorrhagic renal cyst. Readmitted 5/3 with abdominal pain, gross hematuria, and supratherapeutic INR. CT abdomen at OSH showed increase in size of perinephric hematoma. Patient s/p embolization on 5/13. Last dose warfarin PTA was on 5/2 and patient is s/p 8 mg total of Vitamin K during hospitalization. INR goal has been decreased to 2-2.5 per MD. Warfarin resumed 5/14.   Today, 09/28/2018: INR = 2.7, remains above goal range, but trending down   CBC: Hgb 9.1 relatively stable, Pltc WNL  Goal of Therapy:  INR 2-2.5 Monitor platelets by anticoagulation protocol: Yes   Plan:   Hold warfarin today  Daily PT/INR  Follow CBC and for s/sx of bleeding    Lindell Spar, PharmD, BCPS Clinical Pharmacist 10/05/2018 2:49 PM  Please utilize Amion for appropriate phone number to reach the unit pharmacist ([place])

## 2018-09-18 NOTE — Plan of Care (Signed)
  Problem: RH BOWEL ELIMINATION Goal: RH STG MANAGE BOWEL WITH ASSISTANCE Description STG Manage Bowel with min Assistance.   Outcome: Progressing   Problem: RH SKIN INTEGRITY Goal: RH STG SKIN FREE OF INFECTION/BREAKDOWN Description Assess skin, inspect groin for MASD and treat with min assist    Outcome: Progressing Goal: RH STG MAINTAIN SKIN INTEGRITY WITH ASSISTANCE Description STG Maintain Skin Integrity With min Assistance.   Outcome: Progressing Goal: RH STG ABLE TO PERFORM INCISION/WOUND CARE W/ASSISTANCE Description STG Able To Perform Incision/Wound Care With min Assistance.   Outcome: Progressing   Problem: RH SAFETY Goal: RH STG ADHERE TO SAFETY PRECAUTIONS W/ASSISTANCE/DEVICE Description STG Adhere to Safety Precautions With min Assistance/Device.   Outcome: Progressing   Problem: RH PAIN MANAGEMENT Goal: RH STG PAIN MANAGED AT OR BELOW PT'S PAIN GOAL Description Maintain pain level less than 4 on scale of 0-10   Outcome: Progressing   Problem: RH KNOWLEDGE DEFICIT GENERAL Goal: RH STG INCREASE KNOWLEDGE OF SELF CARE AFTER HOSPITALIZATION Description Pt will be able to verbalize restrictions related to renal impairment and state rationales for 2 or more medications. Pt will adhere to dialysis schedule.    Outcome: Progressing

## 2018-09-18 NOTE — Progress Notes (Signed)
Physical Therapy Note  Patient Details  Name: [name] [name] MRN: [id number] Date of Birth: 1966/02/12 Today's Date: 09/27/2018    Patient changed to 15/7 schedule due to patient being unable to tolerate high intensity therapy schedule, limited by decreased activity tolerance.   Kilie Rund L Evaan Tidwell PT, DPT  09/17/2018, 12:10 PM

## 2018-09-18 NOTE — Progress Notes (Signed)
Notified wife of status change and patient moving to 31M.

## 2018-09-18 NOTE — Progress Notes (Signed)
eLink Physician-Brief Progress Note Patient Name: [name] [name] DOB: [birth year] MRN: [id number]   Date of Service  09/17/2018  HPI/Events of Note  Multiple issues: 1. Hypotension - BP = 78/35 by A-line. CVP -10 and Hgb = 11.6 LVEF = 30%. And 2. ABG on 100%/PRVC 24/TV 640/P 5 = 7.39/30.8/41.0. Femoral CVL, therefore, can't obtain COOX.  eICU Interventions  Will order: 1. Bolus with 0.9 NaCl 250 mL now. 2. Increase ceiling on Norepinephrine IV infusion to 70 mcg/min.  3. Increase PEEP to 10.     Intervention Category Major Interventions: Hypotension - evaluation and management;Hypoxemia - evaluation and management;Respiratory failure - evaluation and management  Marylynn Rigdon Cornelia Copa 09/17/2018, 10:25 PM

## 2018-09-18 NOTE — Code Documentation (Signed)
Received a call from [facility] staff - per staff member - patient was coding. I asked that a Code Blue be arrived and I immediately went to 4W. CPR was underway, Code Team arrived - ACLS ongoing. See Code [employer]. ROSC at 1700 - transferred to 3M12.   Code Called 4315 End Time 1815

## 2018-09-18 NOTE — Code Documentation (Signed)
  Patient Name: [name] [name]   MRN: [id number]   Date of Birth/ Sex: [birth year] , male      Admission Date: (Not on file)  Attending Provider: Kipp Brood, MD  Primary Diagnosis: <principal problem not specified>   Indication: Pt was in his usual state of health until this PM, when he was noted to be unresponsive, downtime prior to being found about 5 minutes. Found to be in PEA arrest. Code blue was subsequently called. At the time of arrival on scene, ACLS protocol was underway.   Technical Description:  - CPR performance duration:  21 minute  - Was defibrillation or cardioversion used? Yes   - Was external pacer placed? No  - Was patient intubated pre/post CPR? Yes   Medications Administered: Y = Yes; Blank = No Amiodarone  1  Atropine    Calcium    Epinephrine  5  Lidocaine    Magnesium    Norepinephrine  Y gtt  Phenylephrine    Sodium bicarbonate  3  Vasopressin     Massive Transfusion protocol started. Given 1L NS bolus and first unit pRBC transfusing at time of transfer.   Post CPR evaluation:  - Final Status - Was patient successfully resuscitated ? Yes - What is current rhythm? Sinus - What is current hemodynamic status? Guarded  Miscellaneous Information:  - Labs sent, including: CMP, CBC, LA, Troponin, Mag, PT/INR  - Primary team notified?  Yes  - Family Notified? No, difficult to reach, left voicemail  - Additional notes/ transfer status: ICU     Carroll Sage, MD  09/29/2018, 5:42 PM

## 2018-09-18 NOTE — Progress Notes (Signed)
Responded to code blue.  I called floor to inform that I will be there as soon as I finish my time in the ED. Upon entering room Nurse stated that PT was in process of code blue treatment.. There are no family present in room. I offered ministry of presence and silent prayer. Chaplain available as needed. Chaplain Fidel Levy  [phone]

## 2018-09-18 NOTE — Consult Note (Deleted)
NAME:  [name] [name], MRN:  [id number], DOB:  [birth year], LOS: 33 ADMISSION DATE:  09/04/2018, CONSULTATION DATE:  09/20/2018 REFERRING MD:  Code team, CHIEF COMPLAINT:  Cardiac arrest   Brief History   53 year old man initially admitted ith hx of  initially admitted 5/3 to 5/19 with abdominal pain found to have  left sided perinephric hematoma in the setting of supratherapeutic INR and 7 units of PRBC with transcatheter embolization.  Discharged to CIR on 5/19. 6/2 developed worsening flank pain with decompensation to PEA cardiac arrest with one VT s/p defib x 1 and amiodarone with ROSC after 21 mins.  Intubated and transferred to ICU on pressors and PRBC hanging.   History of present illness   HPI obtained from medical chart review as patient is intubated and mechanically ventilated.    53 year old male with extensive history of ESRD on HD TTS (18 years w/at end vascular access), HTN, CAD s/p CABG, combined HF, ESRD on HD, and mechanical AS s/p AVR on coumadin, Anemia, severe protein calorie malnutrition, and Afib.    Initially admitted 4/15- 4/21 for supratherapeutic INR and ruptured hemorrhagic renal cyst.  Then again admitted 5/3 to 5/19 with abdominal pain found to have a have left sided perinephric hematoma in the setting of supratherapeutic INR and 7 units of PRBC with transcatheter embolization on 5/13.  He was discharged to CIR on 5/19.  His hemoglobin dropped in CIR and since been transfused 2 units of PRBC with iHD on 5/29.  On 6/2 he developed worsening flank pain with nausea and vomiting.  Unable to make it for repeat CT abd/ pelvis before he was found unresponsive in CIR, unwitnessed with estimated down time 5 minutes prior to starting CPR.  PEA cardiac arrest with one episode of VT s/p 1 defib and amiodarone.  ROSC obtained after 21 minutes.  Intubated by anesthesia.  Remained unresponsive and transferred to ICU on levophed and PRBC hanging.     Past Medical History  ESRD on HD TTS (18  years w/at end vascular access), HTN, CAD s/p CABG, combined HF, ESRD on HD, and mechanical AS s/p AVR on coumadin, Anemia, severe protein calorie malnutrition, and Afib    Significant Hospital Events   5/3 to 5/19 with abdominal pain found to have a have left sided perinephric hematoma 5/19 To CIR 6/2 cardiac arrest   Consults:  Nephrology PMT  Procedures:  6/2 ETT >> 6/2 R femoral CVL >> 6/2 R femoral aline >>  Significant Diagnostic Tests:  6/2 CT abd/pelvis >>  Micro Data:   Antimicrobials:   Interim history/subjective:  Levophed at 20 mcg/min peripherally with 2 PIV and on second unit of PRBC and 1L NS Bright red blood coming from penis   Objective   Blood pressure (!) 86/48, pulse (!) 115, temperature 98 F (36.7 C), temperature source Oral, resp. rate 20, weight 119.2 kg, SpO2 (!) 88 %.        Intake/Output Summary (Last 24 hours) at 09/29/2018 1721 Last data filed at 09/17/2018 1400 Gross per 24 hour  Intake 30 ml  Output 1100 ml  Net -1070 ml   Filed Weights   09/17/18 1800 09/17/18 2141 10/06/2018 0326  Weight: 120 kg 119 kg 119.2 kg    Examination: General:  Critically ill male unresponsive on mechanical ventilation HEENT: ETT, pupils 4/non-reactive, absent corneals Neuro: unresponsive  CV: RR, mechanical heart sound PULM: even/non-labored, rhonchi in LUL w/wheeze, diminished in bases GI: obese, hard abd,  scattered bruising, scant BS, no foley but some blood at site  Extremities: cool /dry, +1 LE edema , LAVF Skin: no rashes   Resolved Hospital Problem list    Assessment & Plan:  PEA/ one episode of VT cardiac arrest  Shock- unclear at this point - ddx:  suspected ABLA/ re-bleed of prior left sided perinephric hematoma vs aspiration event given N/V P:  tx ICU  Will obtain central access Continue levophed for goal MAP >65, add vasopressin Stat CBC/ CMP/ Mag/ lactic/ coags Hold further PRBC transfusion (2 since cardiac arrest)  Add stress dose  solucortef CXR pending CT abd/ pelvis when stabilized  EKG/ assess troponin    Acute respiratory insufficiency related to cardiac arrest  R/o possible aspiration event  P:  Full MV support, PRVC 8 cc/kg CXR and ABG now VAP measures Empiric vanc/ cefepime    Acute encephalopathy - concern for anoxic injury given down time P:  Defer TTM due to concern for acute hemorrhage  Serial neuro exams Ongoing GOC - continued with PMT from prior    Leukocytosis  P:  Pan-culture Empiric vanc/ cefepime Trend CBC/ WBC   Mechanical AVR on coumadin - INR 2.7 therapeutic  P:  Hold coumadin  HTN, CAD s/p CABG, combined HF, afib  - on chronic midodrine TID P:  Hold amio, lipitor   ESRD on HD P:  Per Nephrology  Last iHD 6/1  Acute on chronic anemia P:  Pending CBC Transfuse for <7  Best practice:  Diet: NPO Pain/Anxiety/Delirium protocol (if indicated): prn fentanyl/ versed VAP protocol (if indicated): yes DVT prophylaxis: not indicated in active hemorrhage GI prophylaxis: pepcid Glucose control: CBG q 4 Mobility: BR Code Status: Full Family Communication: pending Disposition: ICU  Labs   CBC: Recent Labs  Lab 09/14/18 0739 09/15/18 0715 09/16/18 0710 09/17/18 0837 10/16/2018 0727  WBC 9.0 9.9 12.3* 16.9* 17.9*  HGB 7.0* 8.8* 9.6* 10.2* 9.1*  HCT 23.8* 28.6* 32.9* 35.2* 30.0*  MCV 84.4 83.4 88.2 87.3 83.1  PLT 311 325 273 449* 981    Basic Metabolic Panel: Recent Labs  Lab 09/13/18 1348 09/14/18 0739  NA 134* 137  K 4.2 3.5  CL 94* 98  CO2 24 26  GLUCOSE 93 95  BUN 54* 30*  CREATININE 7.53* 4.95*  CALCIUM 8.5* 8.4*  PHOS 4.4 3.7   GFR: Estimated Creatinine Clearance: 23.3 mL/min (A) (by C-G formula based on SCr of 4.95 mg/dL (H)). Recent Labs  Lab 09/15/18 0715 09/16/18 0710 09/17/18 0837 10/12/2018 0727  WBC 9.9 12.3* 16.9* 17.9*    Liver Function Tests: Recent Labs  Lab 09/13/18 1348 09/14/18 0739  ALBUMIN 1.4* 1.5*   No results  for input(s): LIPASE, AMYLASE in the last 168 hours. No results for input(s): AMMONIA in the last 168 hours.  ABG    Component Value Date/Time   PHART 7.409 09/03/2017 1110   PCO2ART 36.4 09/03/2017 1110   PO2ART 67.0 (L) 09/03/2017 1110   HCO3 23.5 09/03/2017 1110   TCO2 29 11/28/2017 0743   ACIDBASEDEF 1.0 09/03/2017 1110   O2SAT 43.2 09/04/2017 0848     Coagulation Profile: Recent Labs  Lab 09/14/18 0739 09/15/18 0715 09/16/18 0844 09/17/18 0837 10/04/2018 0727  INR 2.6* 2.3* 2.3* 3.0* 2.7*    Cardiac Enzymes: No results for input(s): CKTOTAL, CKMB, CKMBINDEX, TROPONINI in the last 168 hours.  HbA1C: Hgb A1c MFr Bld  Date/Time Value Ref Range Status  08/21/2017 11:43 AM 5.0 4.8 - 5.6 % Final  Comment:    (NOTE) Pre diabetes:          5.7%-6.4% Diabetes:              >6.4% Glycemic control for   <7.0% adults with diabetes     CBG: No results for input(s): GLUCAP in the last 168 hours.  Review of Systems:   Unable  Past Medical History  He,  has a past medical history of Aneurysm ([place]), Angina, Chest discomfort, CHF (congestive heart failure) ([place]), Coronary artery disease, Coronary artery disease involving native coronary artery of native heart without angina pectoris, Dialysis patient ([place]), Dysrhythmia, ESRD (end stage renal disease) ([place]), Hypertension, Leg pain, Mitral regurgitation, Morbid obesity ([place]), Orthostatic hypotension, Overweight(278.02), Renal insufficiency, S/P aortic valve replacement with bileaflet mechanical valve (09/01/2017), S/P CABG x 1 (09/01/2017), Severe aortic insufficiency, Sinus tachycardia, and Syncope.   Surgical History    Past Surgical History:  Procedure Laterality Date   A/V FISTULAGRAM Left 06/30/2016   Procedure: A/V Fistulagram;  Surgeon: Algernon Huxley, MD;  Location: [place] [facility];  Service: Cardiovascular;  Laterality: Left;   AORTIC VALVE REPLACEMENT N/A 09/01/2017   Procedure: AORTIC VALVE REPLACEMENT (AVR);   Surgeon: [name], MD;  Location: [place];  Service: Open Heart Surgery;  Laterality: N/A;   ARTERIOVENOUS GRAFT PLACEMENT     AV FISTULA PLACEMENT     AV FISTULA PLACEMENT Left 11/18/2015   Procedure: ARTERIOVENOUS (AV) FISTULA CREATION ( RADIOCEPHALIC );  Surgeon: Algernon Huxley, MD;  Location: [facility];  Service: Vascular;  Laterality: Left;   AV FISTULA PLACEMENT Left 03/23/2016   Procedure: ARTERIOVENOUS (AV) FISTULA CREATION ( REVISION );  Surgeon: Algernon Huxley, MD;  Location: [facility];  Service: Vascular;  Laterality: Left;   [place] REMOVAL Left 03/23/2016   Procedure: REMOVAL OF ARTERIOVENOUS GORETEX GRAFT ([place]);  Surgeon: Algernon Huxley, MD;  Location: [facility];  Service: Vascular;  Laterality: Left;   CENTRAL LINE INSERTION Right 09/01/2017   Procedure: FLOROSCOPY GUIDED PLACEMENT OF RIGHT FEMORAL CENTRAL LINE TIMES TWO  AND PLACEMENT OF SWAN GANZ CATHETER;  Surgeon: [name], MD;  Location: [place];  Service: Open Heart Surgery;  Laterality: Right;   CORONARY ARTERY BYPASS GRAFT N/A 09/01/2017   Procedure: CORONARY ARTERY BYPASS GRAFTING (CABG) X 1, USING RIGHT GREATER SAPHENOUS VEIN HARVESTED ENDOSCOPICALLY;  Surgeon: [name], MD;  Location: [place];  Service: Open Heart Surgery;  Laterality: N/A;   DIALYSIS/PERMA CATHETER REMOVAL N/A 08/01/2016   Procedure: Dialysis/Perma Catheter Removal;  Surgeon: Algernon Huxley, MD;  Location: [place] [facility];  Service: Cardiovascular;  Laterality: N/A;   HERNIA REPAIR     INSERTION OF DIALYSIS CATHETER  03/01/2011   Procedure: INSERTION OF DIALYSIS CATHETER;  Surgeon: Elam Dutch, MD;  Location: [place];  Service: Vascular;  Laterality: Left;  Exchange of Dialysis Catheter to 27cm 15Fr. Arrow Catheter   INSERTION OF DIALYSIS CATHETER  09/01/2017   Procedure: PLACEMENT OF TRIALYSIS SHORT TERM DIALYSIS CATHETER;  Surgeon: [name], MD;  Location: [place];  Service: Open Heart Surgery;;   IR EMBO ART  VEN HEMORR LYMPH  EXTRAV  INC GUIDE ROADMAPPING  08/29/2018   IR FLUORO GUIDE CV LINE RIGHT  08/27/2018   IR RENAL SUPRASEL UNI S&I MOD SED  08/29/2018   IR US GUIDE VASC ACCESS RIGHT  08/27/2018   IR US GUIDE VASC ACCESS RIGHT  08/29/2018   MULTIPLE EXTRACTIONS WITH ALVEOLOPLASTY N/A 08/07/2017  Procedure: Extraction of tooth #2, 7,8,13,14,15,17, and 31 with alveoloplasty and gross debridement of remaining teeth;  Surgeon: [name], DDS;  Location: [place];  Service: Oral Surgery;  Laterality: N/A;   PERIPHERAL VASCULAR CATHETERIZATION N/A 09/01/2014   Procedure: A/V Shuntogram/Fistulagram;  Surgeon: Algernon Huxley, MD;  Location: [place] [facility];  Service: Cardiovascular;  Laterality: N/A;   PERIPHERAL VASCULAR CATHETERIZATION Right 09/01/2014   Procedure: Thrombectomy;  Surgeon: Algernon Huxley, MD;  Location: [place] [facility];  Service: Cardiovascular;  Laterality: Right;   PERIPHERAL VASCULAR CATHETERIZATION Right 09/01/2014   Procedure: A/V Shunt Intervention;  Surgeon: Algernon Huxley, MD;  Location: [place] [facility];  Service: Cardiovascular;  Laterality: Right;   PERIPHERAL VASCULAR CATHETERIZATION N/A 09/17/2014   Procedure: A/V Shuntogram/Fistulagram;  Surgeon: Algernon Huxley, MD;  Location: [place] [facility];  Service: Cardiovascular;  Laterality: N/A;   PERIPHERAL VASCULAR CATHETERIZATION Right 10/17/2014   Procedure: Thrombectomy;  Surgeon: Katha Cabal, MD;  Location: [place] [facility];  Service: Cardiovascular;  Laterality: Right;   PERIPHERAL VASCULAR CATHETERIZATION N/A 10/17/2014   Procedure: A/V Shuntogram/Fistulagram;  Surgeon: Katha Cabal, MD;  Location: [place] [facility];  Service: Cardiovascular;  Laterality: N/A;   PERIPHERAL VASCULAR CATHETERIZATION N/A 10/17/2014   Procedure: A/V Shunt Intervention;  Surgeon: Katha Cabal, MD;  Location: [place] [facility];  Service: Cardiovascular;  Laterality: N/A;   PERIPHERAL VASCULAR CATHETERIZATION Right 11/04/2014    Procedure: Thrombectomy;  Surgeon: Katha Cabal, MD;  Location: [place] [facility];  Service: Cardiovascular;  Laterality: Right;   PERIPHERAL VASCULAR CATHETERIZATION Left 11/04/2014   Procedure: Visceral Venography;  Surgeon: Katha Cabal, MD;  Location: [place] [facility];  Service: Cardiovascular;  Laterality: Left;   PERIPHERAL VASCULAR CATHETERIZATION Right 11/27/2014   Procedure: A/V Shuntogram/Fistulagram;  Surgeon: Algernon Huxley, MD;  Location: [place] [facility];  Service: Cardiovascular;  Laterality: Right;   PERIPHERAL VASCULAR CATHETERIZATION Right 11/27/2014   Procedure: A/V Shunt Intervention;  Surgeon: Algernon Huxley, MD;  Location: [place] [facility];  Service: Cardiovascular;  Laterality: Right;   PERIPHERAL VASCULAR CATHETERIZATION Right 12/31/2014   Procedure: Thrombectomy;  Surgeon: Algernon Huxley, MD;  Location: [place] [facility];  Service: Cardiovascular;  Laterality: Right;   PERIPHERAL VASCULAR CATHETERIZATION Right 01/12/2015   Procedure: A/V Shuntogram/Fistulagram;  Surgeon: Algernon Huxley, MD;  Location: [place] [facility];  Service: Cardiovascular;  Laterality: Right;   PERIPHERAL VASCULAR CATHETERIZATION N/A 01/12/2015   Procedure: A/V Shunt Intervention;  Surgeon: Algernon Huxley, MD;  Location: [place] [facility];  Service: Cardiovascular;  Laterality: N/A;   PERIPHERAL VASCULAR CATHETERIZATION Right 01/28/2015   Procedure: Thrombectomy;  Surgeon: Algernon Huxley, MD;  Location: [place] [facility];  Service: Cardiovascular;  Laterality: Right;   PERIPHERAL VASCULAR CATHETERIZATION N/A 06/08/2015   Procedure: A/V Shuntogram/Fistulagram;  Surgeon: Algernon Huxley, MD;  Location: [place] [facility];  Service: Cardiovascular;  Laterality: N/A;   PERIPHERAL VASCULAR CATHETERIZATION N/A 06/08/2015   Procedure: A/V Shunt Intervention;  Surgeon: Algernon Huxley, MD;  Location: [place] [facility];  Service: Cardiovascular;  Laterality: N/A;   PERIPHERAL VASCULAR  CATHETERIZATION Right 07/06/2015   Procedure: A/V Shuntogram/Fistulagram;  Surgeon: Algernon Huxley, MD;  Location: [place] [facility];  Service: Cardiovascular;  Laterality: Right;   PERIPHERAL VASCULAR CATHETERIZATION N/A 07/06/2015   Procedure: A/V Shunt Intervention;  Surgeon: Algernon Huxley, MD;  Location: [place] [facility];  Service: Cardiovascular;  Laterality: N/A;   PERIPHERAL VASCULAR CATHETERIZATION N/A 08/04/2015   Procedure: graft declot;  Surgeon: Katha Cabal, MD;  Location: [place] [facility];  Service: Cardiovascular;  Laterality: N/A;   PERIPHERAL VASCULAR CATHETERIZATION Right 08/25/2015   Procedure: A/V Shuntogram/Fistulagram;  Surgeon: Katha Cabal, MD;  Location: [place] [facility];  Service: Cardiovascular;  Laterality: Right;   PERIPHERAL VASCULAR CATHETERIZATION N/A 11/04/2015   Procedure: Dialysis/Perma Catheter Insertion;  Surgeon: Algernon Huxley, MD;  Location: [place] [facility];  Service: Cardiovascular;  Laterality: N/A;   PERIPHERAL VASCULAR CATHETERIZATION Left 12/14/2015   Procedure: A/V Shuntogram/Fistulagram;  Surgeon: Algernon Huxley, MD;  Location: [place] [facility];  Service: Cardiovascular;  Laterality: Left;   PERIPHERAL VASCULAR CATHETERIZATION N/A 12/14/2015   Procedure: A/V Shunt Intervention;  Surgeon: Algernon Huxley, MD;  Location: [place] [facility];  Service: Cardiovascular;  Laterality: N/A;   PERIPHERAL VASCULAR CATHETERIZATION N/A 12/17/2015   Procedure: Dialysis/Perma Catheter Insertion;  Surgeon: Algernon Huxley, MD;  Location: [place] [facility];  Service: Cardiovascular;  Laterality: N/A;   PERIPHERAL VASCULAR CATHETERIZATION Left 12/22/2015   Procedure: Dialysis/Perma Catheter Insertion;  Surgeon: Katha Cabal, MD;  Location: [place] [facility];  Service: Cardiovascular;  Laterality: Left;   PERIPHERAL VASCULAR CATHETERIZATION Left 02/29/2016   Procedure: A/V Shuntogram/Fistulagram;  Surgeon: Algernon Huxley, MD;  Location: [place] [facility];  Service: Cardiovascular;  Laterality: Left;   PERIPHERAL VASCULAR CATHETERIZATION N/A 02/29/2016   Procedure: A/V Shunt Intervention;  Surgeon: Algernon Huxley, MD;  Location: [place] [facility];  Service: Cardiovascular;  Laterality: N/A;   POCKET REVISION/RELOCATION N/A 01/01/2018   Procedure: POCKET REVISION/RELOCATION;  Surgeon: Evans Lance, MD;  Location: [place] [facility];  Service: Cardiovascular;  Laterality: N/A;   right arm graft     for dyalisis   RIGHT/LEFT HEART CATH AND CORONARY ANGIOGRAPHY N/A 08/02/2017   Procedure: RIGHT/LEFT HEART CATH AND CORONARY ANGIOGRAPHY;  Surgeon: Larey Dresser, MD;  Location: [place] [facility];  Service: Cardiovascular;  Laterality: N/A;   SUBQ ICD IMPLANT N/A 11/28/2017   Procedure: SUBQ ICD IMPLANT;  Surgeon: Evans Lance, MD;  Location: [place] [facility];  Service: Cardiovascular;  Laterality: N/A;   SUBQ ICD REVISION N/A 02/12/2018   Procedure: SUBQ ICD REVISION(REMOVAL);  Surgeon: Evans Lance, MD;  Location: [place] [facility];  Service: Cardiovascular;  Laterality: N/A;   TEE WITHOUT CARDIOVERSION N/A 07/31/2017   Procedure: TRANSESOPHAGEAL ECHOCARDIOGRAM (TEE);  Surgeon: Josue Hector, MD;  Location: [facility];  Service: Cardiovascular;  Laterality: N/A;   TEE WITHOUT CARDIOVERSION N/A 09/01/2017   Procedure: TRANSESOPHAGEAL ECHOCARDIOGRAM (TEE);  Surgeon: [name], MD;  Location: [name];  Service: Open Heart Surgery;  Laterality: N/A;   THROMBECTOMY     THROMBECTOMY W/ EMBOLECTOMY  03/01/2011   Procedure: THROMBECTOMY ARTERIOVENOUS GORE-TEX GRAFT;  Surgeon: Elam Dutch, MD;  Location: [place];  Service: Vascular;  Laterality: Right;  Attempted Thrombectomy of Old  Right Upper Arm Arteriovenous gortex Graft. Insertion of new Arteriovenous Graft using 81mm x 50cm Gortex Stretch graft.    THROMBECTOMY W/ EMBOLECTOMY  07/11/2011   Procedure: THROMBECTOMY ARTERIOVENOUS GORE-TEX GRAFT;  Surgeon: Rosetta Posner, MD;  Location: [facility];  Service: Vascular;  Laterality: Right;   UMBILICAL HERNIA REPAIR     VENOGRAM N/A 08/23/2011   Procedure: VENOGRAM;  Surgeon: [name], MD;  Location: [facility];  Service:  Cardiovascular;  Laterality: N/A;     Social History   reports that he quit smoking about 24 years ago. His smoking use included cigarettes. His smokeless tobacco use includes snuff. He reports that he does not drink alcohol or use drugs.   Family History   His family history includes Heart disease in his father and mother; Hypertension in his father and mother.   Allergies No Known Allergies   Home Medications  Prior to Admission medications   Medication Sig Start Date End Date Taking? Authorizing Provider  amiodarone (PACERONE) 200 MG tablet Take 1 tablet (200 mg total) by mouth daily. Patient taking differently: Take 200 mg by mouth at bedtime.  01/16/18   Evans Lance, MD  atorvastatin (LIPITOR) 80 MG tablet Take 1 tablet (80 mg total) by mouth daily at 6 PM. Patient taking differently: Take 80 mg by mouth daily.  10/31/17   Larey Dresser, MD  Cinacalcet HCl (SENSIPAR PO) Take 1 tablet by mouth daily.    [provider]  lanthanum (FOSRENOL) 500 MG chewable tablet Chew 500-1,000 mg by mouth See admin instructions. Take 2 tablets (1000 mg) by mouth three times daily with meals, take 1 tablet (500 mg) with snacks    [provider]  midodrine (PROAMATINE) 5 MG tablet Take 3 tablets (15 mg total) by mouth 3 (three) times daily with meals. Patient taking differently: Take 5 mg by mouth daily.  01/09/18   Evans Lance, MD  warfarin (COUMADIN) 1 MG tablet Take 1 tablet (1 mg total) by mouth every other day for 30 days. 09/05/18 10/05/18  [name], DO     Critical care time: 28 mins    Kennieth Rad, MSN, AGACNP-BC [facility] Pgr: [phone] or if no answer [phone] 09/23/2018, 6:30 PM

## 2018-09-18 NOTE — Progress Notes (Signed)
Lying in bed--continues to report pain but appears to be sedated. Reports that he's eating and denies any GI discomfort. Reports that pain is not any better --still the same. Had difficulty staying awake. Had dialysis last night. MS contin increase to 30 mg yesterday.  Had ultram after lunch--question cause of N/V--will d/c.  Has chronic hypotension--BP tends to run low in afternoons.   Discussed with OT--patient's energy levels and pain no different today then past 5 days. Therapy adjusted to 15 hrs /7 days.

## 2018-09-18 NOTE — Anesthesia Procedure Notes (Signed)
Procedure Name: Intubation Date/Time: 10/11/2018 4:50 PM Performed by: Moshe Salisbury, CRNA Pre-anesthesia Checklist: Patient identified, Emergency Drugs available, Suction available and Patient being monitored Patient Re-evaluated:Patient Re-evaluated prior to induction Preoxygenation: Pre-oxygenation with 100% oxygen Ventilation: Mask ventilation without difficulty Laryngoscope Size: Mac and 3 Grade View: Grade I Tube type: Oral Tube size: 7.5 mm Number of attempts: 1 Airway Equipment and Method: Stylet Placement Confirmation: ETT inserted through vocal cords under direct vision,  positive ETCO2,  breath sounds checked- equal and bilateral and CO2 detector Secured at: 24 cm Tube secured with: Tape Dental Injury: Teeth and Oropharynx as per pre-operative assessment

## 2018-09-19 ENCOUNTER — Inpatient Hospital Stay (HOSPITAL_COMMUNITY): Payer: Medicare Other

## 2018-09-19 ENCOUNTER — Encounter (HOSPITAL_COMMUNITY): Payer: Medicare Other | Admitting: Psychology

## 2018-09-19 DIAGNOSIS — I469 Cardiac arrest, cause unspecified: Secondary | ICD-10-CM

## 2018-09-19 DIAGNOSIS — N186 End stage renal disease: Secondary | ICD-10-CM

## 2018-09-19 DIAGNOSIS — R570 Cardiogenic shock: Secondary | ICD-10-CM

## 2018-09-19 DIAGNOSIS — J9600 Acute respiratory failure, unspecified whether with hypoxia or hypercapnia: Secondary | ICD-10-CM

## 2018-09-19 DIAGNOSIS — Z992 Dependence on renal dialysis: Secondary | ICD-10-CM

## 2018-09-19 LAB — GLUCOSE, CAPILLARY
Glucose-Capillary: 104 mg/dL — ABNORMAL HIGH (ref 70–99)
Glucose-Capillary: 124 mg/dL — ABNORMAL HIGH (ref 70–99)
Glucose-Capillary: 29 mg/dL — CL (ref 70–99)
Glucose-Capillary: 47 mg/dL — ABNORMAL LOW (ref 70–99)
Glucose-Capillary: 71 mg/dL (ref 70–99)
Glucose-Capillary: 76 mg/dL (ref 70–99)
Glucose-Capillary: 77 mg/dL (ref 70–99)
Glucose-Capillary: 79 mg/dL (ref 70–99)
Glucose-Capillary: 80 mg/dL (ref 70–99)
Glucose-Capillary: 82 mg/dL (ref 70–99)
Glucose-Capillary: 89 mg/dL (ref 70–99)
Glucose-Capillary: 95 mg/dL (ref 70–99)

## 2018-09-19 LAB — TYPE AND SCREEN
ABO/RH(D): B POS
Antibody Screen: NEGATIVE
Unit division: 0
Unit division: 0

## 2018-09-19 LAB — POCT I-STAT 7, (LYTES, BLD GAS, ICA,H+H)
Acid-base deficit: 10 mmol/L — ABNORMAL HIGH (ref 0.0–2.0)
Acid-base deficit: 17 mmol/L — ABNORMAL HIGH (ref 0.0–2.0)
Bicarbonate: 17.3 mmol/L — ABNORMAL LOW (ref 20.0–28.0)
Bicarbonate: 12.4 mmol/L — ABNORMAL LOW (ref 20.0–28.0)
Calcium, Ion: 0.99 mmol/L — ABNORMAL LOW (ref 1.15–1.40)
Calcium, Ion: 0.97 mmol/L — ABNORMAL LOW (ref 1.15–1.40)
HCT: 32 % — ABNORMAL LOW (ref 39.0–52.0)
HCT: 28 % — ABNORMAL LOW (ref 39.0–52.0)
Hemoglobin: 10.9 g/dL — ABNORMAL LOW (ref 13.0–17.0)
Hemoglobin: 9.5 g/dL — ABNORMAL LOW (ref 13.0–17.0)
O2 Saturation: 87 %
O2 Saturation: 85 %
Patient temperature: 33
Patient temperature: 33.2
Potassium: 4.2 mmol/L (ref 3.5–5.1)
Potassium: 4.6 mmol/L (ref 3.5–5.1)
Sodium: 139 mmol/L (ref 135–145)
Sodium: 139 mmol/L (ref 135–145)
TCO2: 19 mmol/L — ABNORMAL LOW (ref 22–32)
TCO2: 14 mmol/L — ABNORMAL LOW (ref 22–32)
pCO2 arterial: 35 mmHg (ref 32.0–48.0)
pCO2 arterial: 37.8 mmHg (ref 32.0–48.0)
pH, Arterial: 7.281 — ABNORMAL LOW (ref 7.350–7.450)
pH, Arterial: 7.098 — CL (ref 7.350–7.450)
pO2, Arterial: 48 mmHg — ABNORMAL LOW (ref 83.0–108.0)
pO2, Arterial: 54 mmHg — ABNORMAL LOW (ref 83.0–108.0)

## 2018-09-19 LAB — PREPARE FRESH FROZEN PLASMA: Unit division: 0

## 2018-09-19 LAB — BPAM RBC
Blood Product Expiration Date: 202006192359
Blood Product Expiration Date: 202006232359
ISSUE DATE / TIME: 202006021705
ISSUE DATE / TIME: 202006021705
Unit Type and Rh: 5100
Unit Type and Rh: 5100

## 2018-09-19 LAB — CBC
HCT: 34.7 % — ABNORMAL LOW (ref 39.0–52.0)
HCT: 30.6 % — ABNORMAL LOW (ref 39.0–52.0)
Hemoglobin: 10.5 g/dL — ABNORMAL LOW (ref 13.0–17.0)
Hemoglobin: 8.8 g/dL — ABNORMAL LOW (ref 13.0–17.0)
MCH: 26 pg (ref 26.0–34.0)
MCH: 26 pg (ref 26.0–34.0)
MCHC: 30.3 g/dL (ref 30.0–36.0)
MCHC: 28.8 g/dL — ABNORMAL LOW (ref 30.0–36.0)
MCV: 85.9 fL (ref 80.0–100.0)
MCV: 90.5 fL (ref 80.0–100.0)
Platelets: 450 10*3/uL — ABNORMAL HIGH (ref 150–400)
Platelets: 442 10*3/uL — ABNORMAL HIGH (ref 150–400)
RBC: 4.04 MIL/uL — ABNORMAL LOW (ref 4.22–5.81)
RBC: 3.38 MIL/uL — ABNORMAL LOW (ref 4.22–5.81)
RDW: 18 % — ABNORMAL HIGH (ref 11.5–15.5)
RDW: 18.6 % — ABNORMAL HIGH (ref 11.5–15.5)
WBC: 5.4 10*3/uL (ref 4.0–10.5)
WBC: 7 10*3/uL (ref 4.0–10.5)
nRBC: 0.7 % — ABNORMAL HIGH (ref 0.0–0.2)
nRBC: 1.1 % — ABNORMAL HIGH (ref 0.0–0.2)

## 2018-09-19 LAB — APTT
aPTT: 55 seconds — ABNORMAL HIGH (ref 24–36)
aPTT: 66 seconds — ABNORMAL HIGH (ref 24–36)

## 2018-09-19 LAB — POCT I-STAT 4, (NA,K, GLUC, HGB,HCT)
Glucose, Bld: 50 mg/dL — ABNORMAL LOW (ref 70–99)
HCT: 36 % — ABNORMAL LOW (ref 39.0–52.0)
Hemoglobin: 12.2 g/dL — ABNORMAL LOW (ref 13.0–17.0)
Potassium: 4.1 mmol/L (ref 3.5–5.1)
Sodium: 139 mmol/L (ref 135–145)

## 2018-09-19 LAB — BASIC METABOLIC PANEL
Anion gap: 21 — ABNORMAL HIGH (ref 5–15)
Anion gap: 22 — ABNORMAL HIGH (ref 5–15)
BUN: 57 mg/dL — ABNORMAL HIGH (ref 6–20)
BUN: 58 mg/dL — ABNORMAL HIGH (ref 6–20)
CO2: 16 mmol/L — ABNORMAL LOW (ref 22–32)
CO2: 13 mmol/L — ABNORMAL LOW (ref 22–32)
Calcium: 7.8 mg/dL — ABNORMAL LOW (ref 8.9–10.3)
Calcium: 7.6 mg/dL — ABNORMAL LOW (ref 8.9–10.3)
Chloride: 102 mmol/L (ref 98–111)
Chloride: 103 mmol/L (ref 98–111)
Creatinine, Ser: 5.83 mg/dL — ABNORMAL HIGH (ref 0.61–1.24)
Creatinine, Ser: 5.97 mg/dL — ABNORMAL HIGH (ref 0.61–1.24)
GFR calc Af Amer: 12 mL/min — ABNORMAL LOW (ref >60)
GFR calc Af Amer: 11 mL/min — ABNORMAL LOW (ref >60)
GFR calc non Af Amer: 10 mL/min — ABNORMAL LOW (ref >60)
GFR calc non Af Amer: 10 mL/min — ABNORMAL LOW (ref >60)
Glucose, Bld: 83 mg/dL (ref 70–99)
Glucose, Bld: 127 mg/dL — ABNORMAL HIGH (ref 70–99)
Potassium: 4.1 mmol/L (ref 3.5–5.1)
Potassium: 4.8 mmol/L (ref 3.5–5.1)
Sodium: 139 mmol/L (ref 135–145)
Sodium: 138 mmol/L (ref 135–145)

## 2018-09-19 LAB — POCT I-STAT, CHEM 8
BUN: 50 mg/dL — ABNORMAL HIGH (ref 6–20)
BUN: 50 mg/dL — ABNORMAL HIGH (ref 6–20)
BUN: 52 mg/dL — ABNORMAL HIGH (ref 6–20)
Calcium, Ion: 1.01 mmol/L — ABNORMAL LOW (ref 1.15–1.40)
Calcium, Ion: 1.01 mmol/L — ABNORMAL LOW (ref 1.15–1.40)
Calcium, Ion: 1.02 mmol/L — ABNORMAL LOW (ref 1.15–1.40)
Chloride: 102 mmol/L (ref 98–111)
Chloride: 103 mmol/L (ref 98–111)
Chloride: 104 mmol/L (ref 98–111)
Creatinine, Ser: 5.6 mg/dL — ABNORMAL HIGH (ref 0.61–1.24)
Creatinine, Ser: 5.7 mg/dL — ABNORMAL HIGH (ref 0.61–1.24)
Creatinine, Ser: 5.8 mg/dL — ABNORMAL HIGH (ref 0.61–1.24)
Glucose, Bld: 83 mg/dL (ref 70–99)
Glucose, Bld: 83 mg/dL (ref 70–99)
Glucose, Bld: 97 mg/dL (ref 70–99)
HCT: 33 % — ABNORMAL LOW (ref 39.0–52.0)
HCT: 35 % — ABNORMAL LOW (ref 39.0–52.0)
HCT: 35 % — ABNORMAL LOW (ref 39.0–52.0)
Hemoglobin: 11.2 g/dL — ABNORMAL LOW (ref 13.0–17.0)
Hemoglobin: 11.9 g/dL — ABNORMAL LOW (ref 13.0–17.0)
Hemoglobin: 11.9 g/dL — ABNORMAL LOW (ref 13.0–17.0)
Potassium: 4 mmol/L (ref 3.5–5.1)
Potassium: 4 mmol/L (ref 3.5–5.1)
Potassium: 4.1 mmol/L (ref 3.5–5.1)
Sodium: 137 mmol/L (ref 135–145)
Sodium: 137 mmol/L (ref 135–145)
Sodium: 139 mmol/L (ref 135–145)
TCO2: 19 mmol/L — ABNORMAL LOW (ref 22–32)
TCO2: 20 mmol/L — ABNORMAL LOW (ref 22–32)
TCO2: 20 mmol/L — ABNORMAL LOW (ref 22–32)

## 2018-09-19 LAB — BPAM FFP
Blood Product Expiration Date: 202006062359
Blood Product Expiration Date: 202006062359
ISSUE DATE / TIME: 202006021708
ISSUE DATE / TIME: 202006021708
Unit Type and Rh: 6200
Unit Type and Rh: 6200

## 2018-09-19 LAB — PHOSPHORUS
Phosphorus: 6.7 mg/dL — ABNORMAL HIGH (ref 2.5–4.6)
Phosphorus: 9.2 mg/dL — ABNORMAL HIGH (ref 2.5–4.6)

## 2018-09-19 LAB — PROTIME-INR
INR: 2.8 — ABNORMAL HIGH (ref 0.8–1.2)
INR: 3.6 — ABNORMAL HIGH (ref 0.8–1.2)
Prothrombin Time: 29 seconds — ABNORMAL HIGH (ref 11.4–15.2)
Prothrombin Time: 35.7 seconds — ABNORMAL HIGH (ref 11.4–15.2)

## 2018-09-19 LAB — LACTIC ACID, PLASMA: Lactic Acid, Venous: 5.6 mmol/L (ref 0.5–1.9)

## 2018-09-19 LAB — MRSA PCR SCREENING: MRSA by PCR: POSITIVE — AB

## 2018-09-19 LAB — BLOOD PRODUCT ORDER (VERBAL) VERIFICATION

## 2018-09-19 LAB — MAGNESIUM: Magnesium: 1.9 mg/dL (ref 1.7–2.4)

## 2018-09-19 LAB — TROPONIN I: Troponin I: 0.08 ng/mL (ref <0.03)

## 2018-09-19 MED ORDER — GLYCOPYRROLATE 0.2 MG/ML IJ SOLN: 0.2000 mg | INTRAMUSCULAR | Status: DC | PRN

## 2018-09-19 MED ORDER — MORPHINE 100MG IN NS 100ML (1MG/ML) PREMIX INFUSION
0.0000 mg/h | INTRAVENOUS | Status: DC
  Administered 2018-09-19: 15:00:00 5 mg/h via INTRAVENOUS
  Filled 2018-09-19: qty 100

## 2018-09-19 MED ORDER — CHLORHEXIDINE GLUCONATE CLOTH 2 % EX PADS: 6.0000 | MEDICATED_PAD | Freq: Every day | CUTANEOUS | Status: DC

## 2018-09-19 MED ORDER — MORPHINE SULFATE (PF) 2 MG/ML IV SOLN: 2.0000 mg | INTRAVENOUS | Status: DC | PRN

## 2018-09-19 MED ORDER — SODIUM CHLORIDE 0.9 % IV BOLUS
500.0000 mL | Freq: Once | INTRAVENOUS | Status: AC
  Administered 2018-09-19: 500 mL via INTRAVENOUS

## 2018-09-19 MED ORDER — VANCOMYCIN VARIABLE DOSE PER UNSTABLE RENAL FUNCTION (PHARMACIST DOSING): Status: DC

## 2018-09-19 MED ORDER — DEXTROSE 50 % IV SOLN
50.0000 mL | INTRAVENOUS | Status: AC
  Administered 2018-09-19: 50 mL via INTRAVENOUS

## 2018-09-19 MED ORDER — STERILE WATER FOR INJECTION IV SOLN
INTRAVENOUS | Status: DC
  Administered 2018-09-19: 08:00:00 via INTRAVENOUS
  Filled 2018-09-19 (×2): qty 850

## 2018-09-19 MED ORDER — DEXTROSE 5 % IV SOLN: INTRAVENOUS | Status: DC

## 2018-09-19 MED ORDER — MORPHINE BOLUS VIA INFUSION
5.0000 mg | INTRAVENOUS | Status: DC | PRN
  Filled 2018-09-19: qty 5

## 2018-09-19 MED ORDER — ACETAMINOPHEN 325 MG PO TABS: 650.0000 mg | ORAL_TABLET | Freq: Four times a day (QID) | ORAL | Status: DC | PRN

## 2018-09-19 MED ORDER — POLYVINYL ALCOHOL 1.4 % OP SOLN
1.0000 [drp] | Freq: Four times a day (QID) | OPHTHALMIC | Status: DC | PRN
  Filled 2018-09-19: qty 15

## 2018-09-19 MED ORDER — DEXTROSE 50 % IV SOLN
INTRAVENOUS | Status: AC
  Filled 2018-09-19: qty 50

## 2018-09-19 MED ORDER — SODIUM CHLORIDE 0.9 % IV BOLUS
1000.0000 mL | Freq: Once | INTRAVENOUS | Status: AC
  Administered 2018-09-19: 1000 mL via INTRAVENOUS

## 2018-09-19 MED ORDER — EPINEPHRINE PF 1 MG/ML IJ SOLN
0.5000 ug/min | INTRAVENOUS | Status: DC
  Administered 2018-09-19: 0.5 ug/min via INTRAVENOUS
  Administered 2018-09-19: 20 ug/min via INTRAVENOUS
  Filled 2018-09-19 (×5): qty 4

## 2018-09-19 MED ORDER — ACETAMINOPHEN 650 MG RE SUPP: 650.0000 mg | Freq: Four times a day (QID) | RECTAL | Status: DC | PRN

## 2018-09-19 MED ORDER — DIPHENHYDRAMINE HCL 50 MG/ML IJ SOLN: 25.0000 mg | INTRAMUSCULAR | Status: DC | PRN

## 2018-09-19 MED ORDER — GLYCOPYRROLATE 1 MG PO TABS
1.0000 mg | ORAL_TABLET | ORAL | Status: DC | PRN
  Filled 2018-09-19: qty 1

## 2018-09-19 MED ORDER — SODIUM CHLORIDE 0.9 % IV SOLN
1.0000 g | INTRAVENOUS | Status: DC
  Administered 2018-09-19: 1 g via INTRAVENOUS
  Filled 2018-09-19: qty 1

## 2018-09-19 MED ORDER — DEXTROSE 10 % IV SOLN: INTRAVENOUS | Status: DC

## 2018-09-19 MED FILL — Medication: Qty: 1 | Status: AC

## 2018-09-25 ENCOUNTER — Telehealth: Payer: Self-pay

## 2018-09-25 NOTE — Telephone Encounter (Signed)
Received dc from [facility].  Dc is for burial and a patient of Doctor Elsworth Soho.  DC will be taken to Pulmonary Unit for signature.  On 09/26/2018 Received dc back from Doctor Elsworth Soho.  I called the funeral home to let them know the dc was mailed to vital records and faxed a copy per their request.

## 2018-10-02 NOTE — Telephone Encounter (Signed)
Received another original D/C from [place] first original was signed in purple ink-Per [facility] D/C forwarded to Timmothy Euler [place]) for signature.

## 2018-10-03 ENCOUNTER — Ambulatory Visit: Payer: Self-pay | Admitting: Internal Medicine

## 2018-10-04 NOTE — Telephone Encounter (Signed)
Received original signed D/C-Funeral home notified for pick up.

## 2018-10-17 NOTE — Progress Notes (Signed)
Notified Georgann Housekeeper NP for CCM of patients low MAP/SBP on max pressor support.  NP at bedside to evaluate patient.  Orders received to discontinue TTM due to hemodynamic instability and re-warm patient via artic sun.  Awaiting wife arrival to bedside.

## 2018-10-17 NOTE — Progress Notes (Signed)
I received a page from the nurse to provide spiritual support for the patient's wife and daughter. I visited the patient's room and provide pastoral presence, scriptural readings, and prayer. I remained with the family after the patient passed for as [name] as they needed. I escorted his wife and daughter out to the front entrance after they finished their visitation.    10-11-2018 1548  Clinical Encounter Type  Visited With Patient and family together  Visit Type Spiritual support;Death  Referral From Nurse  Consult/Referral To Chaplain  Spiritual Encounters  Spiritual Needs Prayer;Emotional;Grief support    Chaplain Dr Redgie Grayer

## 2018-10-17 NOTE — Progress Notes (Signed)
RT note: patient does not meet SBT criteria for this AM due to hemodynamic instability, FIO2 of 100%, and PEEP of 10.  Tolerating current ventilator settings well.  Will continue to monitor.

## 2018-10-17 NOTE — Progress Notes (Signed)
Nutrition Brief Note  Chart reviewed. Pt now transitioning to comfort care.  No further nutrition interventions warranted at this time.  Please re-consult as needed.   Ernie Sagrero RD, LDN Clinical Nutrition Pager # - [phone]    

## 2018-10-17 NOTE — Progress Notes (Signed)
NAME:  [name] [name], MRN:  [id number], DOB:  [birth year], LOS: 54 ADMISSION DATE:  09/04/2018, CONSULTATION DATE:  10/13/2018 REFERRING MD:  Code team, CHIEF COMPLAINT:  Cardiac arrest   Brief History   53 year old man initially admitted ith hx of  initially admitted 5/3 to 5/19 with abdominal pain found to have  left sided perinephric hematoma in the setting of supratherapeutic INR and 7 units of PRBC with transcatheter embolization.  Discharged to CIR on 5/19. 6/2 developed worsening flank pain with decompensation to PEA cardiac arrest with one VT s/p defib x 1 and amiodarone with ROSC after 21 mins.  Intubated and transferred to ICU on pressors and PRBC hanging.   Past Medical History  ESRD on HD TTS (18 years w/at end vascular access), HTN, CAD s/p CABG, combined HF, ESRD on HD, and mechanical AS s/p AVR on coumadin, Anemia, severe protein calorie malnutrition, and Afib    Significant Hospital Events   5/3 to 5/19 with abdominal pain found to have a have left sided perinephric hematoma 5/19 To CIR 6/2 cardiac arrest   Consults:  Nephrology PMT  Procedures:  6/2 ETT >> 6/2 R femoral CVL >> 6/2 R femoral aline >>  Significant Diagnostic Tests:   Micro Data:  Blood 6/2 >  Sputum 6/2 >  Antimicrobials:  Cefepime 6/2 > Vancomycin 6/2 > Interim history/subjective:  Epi 10mcg Levophed at 80 mcg/min MAP in 40s. Very unstable.   Objective   Blood pressure (!) 86/48, pulse (!) 115, temperature 98 F (36.7 C), temperature source Oral, resp. rate 20, weight 119.2 kg, SpO2 (!) 88 %.        Intake/Output Summary (Last 24 hours) at 09/21/2018 1721 Last data filed at 09/24/2018 1400 Gross per 24 hour  Intake 30 ml  Output 1100 ml  Net -1070 ml   Filed Weights   09/17/18 1800 09/17/18 2141 10/09/2018 0326  Weight: 120 kg 119 kg 119.2 kg    Examination: General:  Morbidly obese adult male on vent.  HEENT: ETT, pupils 4/non-reactive, absent corneals Neuro: GCS 3T CV: RRR,  mechanical heart sound PULM: Bilaterally coarse GI: obese, hard abd, scattered bruising, scant BS, no foley but some blood at site  Extremities: No acute deformity.  Skin:  Mottling to neck.   Resolved Hospital Problem list    Assessment & Plan:   PEA/ one episode of VT cardiac arrest  Shock- unclear at this point. Concern for ABLA given recent perinephritic hemorrhage, but hgb OK on arrival. Did receive blood products. Also concern recent aspiration. Known weak heart.  P:  Continue levophed, epi, vaso for goal MAP >65 mmHg  Trend H/H q 6 Stress dose solu-cortef Sodium bicarb infusion start.  CT abd/ pelvis when if stabilizes Will have to change TTM to 36 given hemodynamic instability.   Acute respiratory insufficiency related to cardiac arrest  R/o possible aspiration event  P:  Full MV support, PRVC 8 cc/kg Trend ABG VAP measures Empiric vanc/cefepime   Acute encephalopathy - concern for anoxic injury given down time P:  Serial neuro exams Ongoing GOC - continued with PMT from prior  Limit sedation  Leukocytosis  P:  Pan-culture Empiric vanc/ cefepime Trend CBC  Mechanical AVR on coumadin - INR 2.7 therapeutic  P:  Hold coumadin  HTN, CAD s/p CABG, combined HF, afib  - on chronic midodrine TID P:  Hold amio, lipitor  ESRD on HD P:  Nephrology consulted.  Last iHD 6/1  Acute on  chronic anemia P:  Pending CBC Transfuse for <7  GOALS OF CARE: Upon my arrival to unit this morning his MAP was in 40s despite 162mcg norepinephrine and 9mcg of epinephrine with shock dose vasopressin. He is being rewarmed and bicarb drip has been started to hopefully address acidosis. I have contacted family who are now in the hospital. They are aware that the acute state of his health with consideration to his multiple medical co-morbidities make it unlikely that he will be successfully resuscitated should he arrest again. He has made his wishes known in the past as DNR. They  wish to enact a DNR at this time, but continue with aggressive medical interventions.   Best practice:  Diet: NPO Pain/Anxiety/Delirium protocol (if indicated): prn fentanyl/ versed VAP protocol (if indicated): yes DVT prophylaxis: On anticoagulation, concern for hemorrhage. SCDs GI prophylaxis: pepcid Glucose control: CBG q 4 Mobility: BR Code Status: DNR Family Communication: Family updated at length 6/3 Disposition: ICU  Labs   CBC: Recent Labs  Lab 09/14/18 0739 09/15/18 0715 09/16/18 0710 09/17/18 0837 09/29/2018 0727  WBC 9.0 9.9 12.3* 16.9* 17.9*  HGB 7.0* 8.8* 9.6* 10.2* 9.1*  HCT 23.8* 28.6* 32.9* 35.2* 30.0*  MCV 84.4 83.4 88.2 87.3 83.1  PLT 311 325 273 449* 338    Basic Metabolic Panel: Recent Labs  Lab 09/13/18 1348 09/14/18 0739  NA 134* 137  K 4.2 3.5  CL 94* 98  CO2 24 26  GLUCOSE 93 95  BUN 54* 30*  CREATININE 7.53* 4.95*  CALCIUM 8.5* 8.4*  PHOS 4.4 3.7   GFR: Estimated Creatinine Clearance: 23.3 mL/min (A) (by C-G formula based on SCr of 4.95 mg/dL (H)). Recent Labs  Lab 09/15/18 0715 09/16/18 0710 09/17/18 0837 10/09/2018 0727  WBC 9.9 12.3* 16.9* 17.9*    Liver Function Tests: Recent Labs  Lab 09/13/18 1348 09/14/18 0739  ALBUMIN 1.4* 1.5*   No results for input(s): LIPASE, AMYLASE in the last 168 hours. No results for input(s): AMMONIA in the last 168 hours.  ABG    Component Value Date/Time   PHART 7.409 09/03/2017 1110   PCO2ART 36.4 09/03/2017 1110   PO2ART 67.0 (L) 09/03/2017 1110   HCO3 23.5 09/03/2017 1110   TCO2 29 11/28/2017 0743   ACIDBASEDEF 1.0 09/03/2017 1110   O2SAT 43.2 09/04/2017 0848     Coagulation Profile: Recent Labs  Lab 09/14/18 0739 09/15/18 0715 09/16/18 0844 09/17/18 0837 09/17/2018 0727  INR 2.6* 2.3* 2.3* 3.0* 2.7*    Cardiac Enzymes: No results for input(s): CKTOTAL, CKMB, CKMBINDEX, TROPONINI in the last 168 hours.  HbA1C: Hgb A1c MFr Bld  Date/Time Value Ref Range Status   08/21/2017 11:43 AM 5.0 4.8 - 5.6 % Final    Comment:    (NOTE) Pre diabetes:          5.7%-6.4% Diabetes:              >6.4% Glycemic control for   <7.0% adults with diabetes     CBG: No results for input(s): GLUCAP in the last 168 hours.  Critical care time: 50 mins     Georgann Housekeeper, AGACNP-BC [place] Pager [phone] or [phone]  10-07-2018 8:12 AM

## 2018-10-17 NOTE — Progress Notes (Signed)
Pt's wife called & updated. Wife mentioned she will visit in the morning.   Plans to discuss goals of care in regard to husband.

## 2018-10-17 NOTE — Progress Notes (Signed)
Ground team MD assessed pt at bedside. Pt maxed out on all pressors (levo, epinephrine, vasopressin) and MAP's remain 40-50's. Per MD, awaiting family's arrival this morning to discuss goals of care.  Decreased propofol to 15 mcg and fentanyl to 100 mcg per physician.   Will continue to monitor.

## 2018-10-17 NOTE — Progress Notes (Signed)
Events noted.  Pt s/p cardiac arrest and now hemodynamically unstable on 3 pressors in ICU.  No absolute indications for dialysis except possibly acidosis. Possibly too unstable for even CRRT at this point.  CCM currently in conversations regarding possible withdrawal of care.  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  I will follow at a distance, call me if I can be of assist   Louis Meckel

## 2018-10-17 NOTE — Progress Notes (Signed)
eLink Physician-Brief Progress Note Patient Name: [name] DOB: July 20, 1965 MRN: [id number]   Date of Service  September 25, 2018  HPI/Events of Note  Hypotension - Now maxed on Norepinephrine and Vasopressin IV infusions. LVEF = 30%. CVP = 10 and Hgb = 11.6. Can't obtain COOX. His pH is normal.   eICU Interventions  Will order: 1. Bolus with 0.9 NaCl 500 mL IV now. 2. Increase ceiling on Norepinephrine IV infusion to 80 mcg/min.  3. Epinephrine IV infusion. Titrate to MAP >= 65.  4. Will ask ground team to evaluate the patient at bedside.      Intervention Category Major Interventions: Shock - evaluation and management;Hypotension - evaluation and management  Sommer,[name] [name] 25-Sep-2018, 12:29 AM

## 2018-10-17 NOTE — Progress Notes (Signed)
EEG complete - results pending 

## 2018-10-17 NOTE — Procedures (Signed)
ELECTROENCEPHALOGRAM REPORT   Patient: [name] [name]       Room #: 0I37C EEG No. ID: [id number] Age: 53 y.o.        Sex: male Referring Physician: Tamala Julian Report Date:  10/05/2018        Interpreting Physician: Alexis Goodell  History: [name] [name] is an 54 y.o. male s/p arrest  Medications:  Cefepime, Solu-cortef, Nimbex, Adrenalin, Fentanyl, Levophed, Diprovan, Pitressin  Conditions of Recording:  This is a 21 channel routine scalp EEG performed with bipolar and monopolar montages arranged in accordance to the international 10/20 system of electrode placement. One channel was dedicated to EKG recording.  The patient is in the intubated and sedated state.  Description:  Artifact is prominent during the recording often obscuring the background rhythm. When able to be visualized the background is very low voltage and for full evaluation sensitivity is adjusted to 5uV/mm.  At this sensitivity the background is discontinuous.  There are periods of attenuation alternating with short periods of higher amplitude polymorphic delta activity.  The periods of attenuation last up to 10 seconds.  The periods of delta activity last from 1-4 seconds.  These periods are synchronous.  This discontinuous activity is persistent throughout the recording.   No epileptiform activity is noted.   Hyperventilation and intermittent photic stimulation were not performed.  IMPRESSION: This is an abnormal EEG due to a low voltage, burst-suppression pattern seen throughout the tracing.  Burst suppression pattern can be seen in a variety of circumstances, including anesthesia, drug intoxication, hypothermia, as well as cerebral anoxia.  Clinical/neurological, and radiographic correlation advised.     Alexis Goodell, MD Neurology [phone] October 05, 2018, 1:07 PM

## 2018-10-17 NOTE — Progress Notes (Addendum)
eLink Physician-Brief Progress Note Patient Name: [name] [name] DOB: 09/27/1965 MRN: [id number]   Date of Service  10/03/18  HPI/Events of Note  Hypotension - BP = 77/38 with MAP = 48 by A-line. Hgb= 11.9 and CVP = 12. Patient is maxed out on Epinephrine, Norepinephrine and Vasopressin.   eICU Interventions  Will order: 1. Bolus with 0.9 NaCl 1 liter IV over 1 hour now.  2. ABG STAT. 3. Will ask ground team to evaluate at bedside yet again. GOC?     Intervention Category Major Interventions: Hypotension - evaluation and management  Sallie Maker [place] 03-Oct-2018, 6:10 AM

## 2018-10-17 NOTE — Procedures (Signed)
Extubation Procedure Note  Patient Details:   Name: CANNAN BEECK DOB: 18-Jan-1966 MRN: [id number]   Airway Documentation:  Airway 7.5 mm (Active)  Secured at (cm) 25 cm 10/13/2018 11:17 AM  Measured From Lips 13-Oct-2018 11:17 AM  Secured Location Right 10-13-18 11:17 AM  Secured By [employer] 2018-10-13 11:17 AM  Tube Holder Repositioned Yes 2018-10-13 11:17 AM  Cuff Pressure (cm H2O) 30 cm H2O 10/13/18  7:41 AM  Site Condition Dry 10-13-18 11:17 AM   Vent end date: 2018-10-13 Vent end time: 1519   Evaluation  O2 sats: stable throughout Complications: No apparent complications Patient did tolerate procedure well. Bilateral Breath Sounds: Clear   No  Pt terminally extubated.  No stridor or dyspnea noted.  Pt extubated to room air.  Family at bedside.  Earney Navy October 13, 2018, 3:20 PM

## 2018-10-17 NOTE — Progress Notes (Signed)
PCCM INTERVAL PROGRESS NOTE   Called by RN after conversation with family, in which they described not wanting [name] [name] to suffer any longer. I met with wife and daughter. They confirm he wished to be DNR prior to this event and would not want to be suffering in this way. He remains ad max dose epinephrine and above max dose levophed. Educated family on procedures of comfort care transition.   They are in agreement.   Transition to comfort care   Georgann Housekeeper, AGACNP-BC [place] Pager [phone] or [phone]  2018/09/28 2:34 PM

## 2018-10-17 NOTE — Progress Notes (Signed)
Social Work  Discharge Note  The overall goal for the admission was met for:   Discharge location: No - pt transferred back to acute due to medical issues  Length of Stay: No  Discharge activity level: No - pt was not doing well on CIR and not on goal to reach originally targeted d/c goals (supervision to minimal assistance overall).  Had planned to discuss possible SNF placement following today's team conference, however, pt with acute decline and transferred off unit.  Home/community participation: No  Services provided included: MD, RD, PT, OT, SLP, RN, Pharmacy and SW  Financial Services: Medicare and Private Insurance: [place]  Follow-up services arranged: NA  Comments (or additional information):  Patient/Family verbalized understanding of follow-up arrangements: NA  Individual responsible for coordination of the follow-up plan: NA  Confirmed correct DME delivered: NA    Wynee Matarazzo

## 2018-10-17 NOTE — Progress Notes (Signed)
   09/26/2018 0929  Clinical Encounter Type  Visited With Patient and family together  Visit Type Initial;Spiritual support  Referral From Nurse  Consult/Referral To Chaplain  Spiritual Encounters  Spiritual Needs Prayer;Emotional  This chaplain responded to the RN request for spiritual care for the Pt. Wife-Valinda. Valinda was sitting beside the Pt. when the chaplain entered the Pt. room.  The chaplain was pastorally present and listened to the Pt. wife share her desire for the Pt. to return home to his family.  In the same breath, Garry Heater is aware the Pt. is tired. The Pt. brother-Herman joined the room.  An invitation for prayer was accepted by the Pt. wife.  The chaplain is available for F/U spiritual care as needed.

## 2018-10-17 NOTE — Discharge Summary (Signed)
   NAME:  [name] [name], MRN:  [id number], DOB:  06-09-1965, LOS: 49 ADMISSION DATE:  09/04/2018, CONSULTATION DATE:  09/18/2018 REFERRING MD:  Code team, CHIEF COMPLAINT:  Cardiac arrest   Brief History   53 year old man initially admitted ith hx of  initially admitted 5/3 to 5/19 with abdominal pain found to have  left sided perinephric hematoma in the setting of supratherapeutic INR and 7 units of PRBC with transcatheter embolization.  Discharged to CIR on 5/19. 6/2 developed worsening flank pain with decompensation to PEA cardiac arrest with one VT s/p defib x 1 and amiodarone with ROSC after 21 mins.  Intubated and transferred to ICU on pressors and PRBC hanging.   Past Medical History  ESRD on HD TTS (18 years w/at end vascular access), HTN, CAD s/p CABG, combined HF, ESRD on HD, and mechanical AS s/p AVR on coumadin, Anemia, severe protein calorie malnutrition, and Afib    Significant Hospital Events   5/3 to 5/19 with abdominal pain found to have a have left sided perinephric hematoma 5/19 To CIR 6/2 cardiac arrest   Consults:  Nephrology PMT  Procedures:  6/2 ETT >> 6/2 R femoral CVL >> 6/2 R femoral aline >>  Significant Diagnostic Tests:    Antimicrobials:  Cefepime 6/2 > Vancomycin 6/2 >    Assessment & Plan:   PEA/ one episode of VT cardiac arrest  Shock- unclear at this point. Concern for ABLA given recent perinephritic hemorrhage, but hgb OK on arrival. Did receive blood products. Also concern recent aspiration. Known weak heart.  P:  Continue levophed, epi, vaso for goal MAP >65 mmHg  Trend H/H q 6 Stress dose solu-cortef Sodium bicarb infusion start.  CT abd/ pelvis when if stabilizes changed TTM to 36 given hemodynamic instability.   Acute respiratory insufficiency related to cardiac arrest  R/o possible aspiration event  P:  Full MV support, PRVC 8 cc/kg Empiric vanc/cefepime   Acute encephalopathy - concern for anoxic injury given down time P:   Serial neuro exams Limit sedation   Mechanical AVR on coumadin - INR 2.7 therapeutic  P:  Hold coumadin  HTN, CAD s/p CABG, combined HF, afib  - on chronic midodrine TID P:  Hold amio, lipitor  ESRD on HD P:  Nephrology consulted.  Last iHD 6/1  Acute on chronic anemia P:  Pending CBC Transfuse for <7  COURSE - Unfortunately he  required high-dose pressors overnight -Levophed 80 mics, epinephrine 20 mics and vasopressin -and has developed worsening acidosis and shock After goals of care discussion with family, DNR was issued and life support was withdrawn and he passed away soon after.  Cause of death-refractory cardiogenic shock, ESRD with calciphylaxis, CAD, atrial fibrillation  Kara Mead MD. FCCP. [facility] Pager [phone]   09/20/2018 2:31 PM

## 2018-10-17 DEATH — deceased

## 2019-01-02 NOTE — Telephone Encounter (Signed)
Error

## 2020-02-06 IMAGING — US IR EMBO ART  VEN HEMORR LYMPH EXTRAV  INC GUIDE ROADMAPPING
1 series · 1 of 1 positions shown · non-contrast
Comparison: none

INDICATION: History of polycystic kidney disease with end-stage renal disease,
currently on dialysis.

[Series 1: ir embo art ven hemorr lymph extrav inc guide road · 1 of 1 slices shown]
[im 1/1]
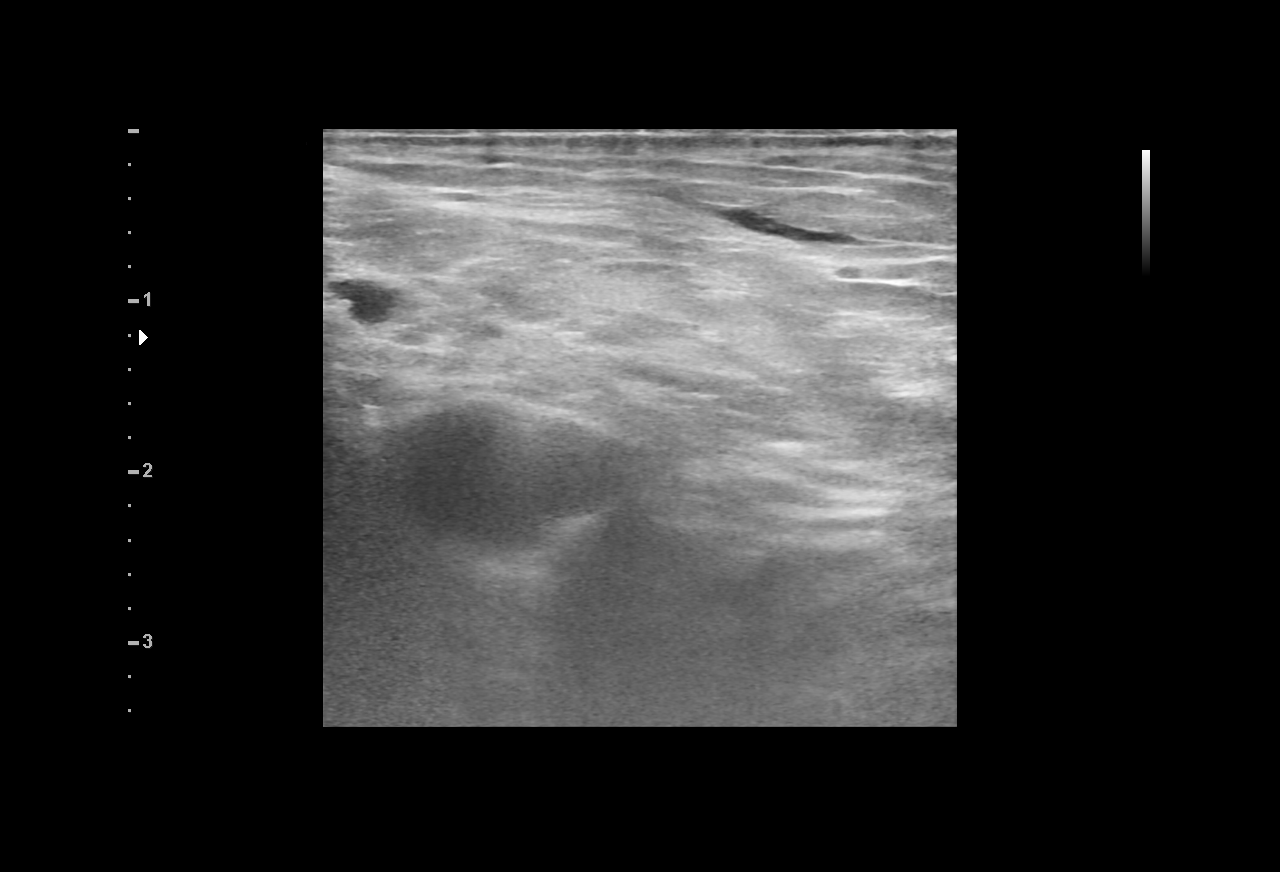

[1 of 1 positions shown; findings below may reference images not displayed]

Patient recently admitted with symptomatic hemorrhagic left-sided
renal cysts initially attributable to supratherapeutic INR.

Unfortunately, despite continued conservative measures, the now
diffuse left-sided renal hemorrhage has enlarged, and as such,
request made by Dr. Onkar (Urology) for percutaneous embolization.

EXAM:
1. ULTRASOUND GUIDANCE FOR ARTERIAL ACCESS
2. SELECTIVE LEFT RENAL ARTERIOGRAM AND PERCUTANEOUS COIL
EMBOLIZATION
3. SUB SELECTIVE SEGMENTAL ARTERIOGRAM AND PERCUTANEOUS COIL
EMBOLIZATION

MEDICATIONS:
None; the patient is admitted to the hospital receiving intravenous
antibiotics. The antibiotic was administered within one hour of the
procedure

ANESTHESIA/SEDATION:
Moderate (conscious) sedation was employed during this procedure. A
total of Versed 2 mg and Fentanyl 100 mcg was administered
intravenously.

Moderate Sedation Time: 85 minutes. The patient's level of
consciousness and vital signs were monitored continuously by
radiology nursing throughout the procedure under my direct
supervision.

CONTRAST:  75 cc Ssovue-EOO

FLUOROSCOPY TIME:  18 minutes, 42 seconds (2,043 mGy)

COMPLICATIONS:
None immediate.



The right femoral head was marked fluoroscopically. Under sterile
conditions and local anesthesia, the right common femoral artery
access was performed with a micropuncture needle. Under direct
ultrasound guidance, the right common femoral was accessed with a
micropuncture kit. An ultrasound image was saved for documentation
purposes. This allowed for placement of a 5-French vascular sheath.
A limited arteriogram was performed through the side arm of the
sheath confirming appropriate access within the right common femoral
artery.

A 5 French Mickelson catheter was advanced to the level of the
abdominal aorta where it was formed, back bled and flushed. The
catheter was utilized to select the left renal artery and a
selective left renal arteriogram was performed.

Next, with the use of a fathom 14 microwire, a regular Renegade
microcatheter was utilized to select a subsegmental branch vessel of
the interpolar division of the left renal artery supplying an
ill-defined area of contrast extravasation. Contrast injection
confirmed appropriate positioning and the subsegmental artery was
coil embolized with 2 overlapping 2 mm and 3 mm interlock coils.

Efforts were made to cannulate several additional segmental branches
of the left renal artery supplying additional sites of contrast
extravasation however ultimately this was deemed unnecessary due to
the diffuse hemorrhage involving the entirety of left kidney.

As such, the distal aspect of the interpolar division of the left
renal artery was percutaneously coil embolized with multiple
overlapping 4 mm, 5 mm and 6 mm interlock coils with extension of
the coil pack into the origins of both the superior and inferior
polar branches.

Microcatheter was removed and completion left renal arteriogram was
performed via the Mickelson catheter. Images were reviewed and the
procedure was terminated.

At this point, all wires, catheters and sheaths were removed from
the patient. Hemostasis was achieved at the right groin access site
with manual compression. The patient tolerated the procedure well
without immediate post procedural complication.
FINDINGS: Selective left renal arteriogram demonstrates at least 3 ill-defined
areas of active extravasation involving the superior, interpolar and
inferior poles of the left kidney.

Initially, a subsegmental branch of the interpolar segment of the
left renal artery was selected and percutaneously coil embolized.

Efforts were made to select the additional subsegmental arteries
supplying the ill-defined areas of contrast extravasation within
both the superior and inferior pole the left kidney, however
ultimately this was deemed unnecessary given diffuse hemorrhage
affecting the left kidney.

As such, the entirety of the left renal artery was percutaneously
coil embolized from the distal aspect of the interpolar division of
the left renal artery (with extension of the coil pack into the
origin's of both the superior and inferior polar branches) to the
level of the mid/distal left main renal artery.

Following percutaneous embolization there is near complete occlusion
of the left renal artery without definitive area of persistent
contrast extravasation.
IMPRESSION: Technically successful percutaneous coil embolization of the left
renal artery for diffuse left-sided renal hemorrhage secondary to
polycystic kidney disease.

PLAN:
- The patient was given a PCA pump for pain control.

- Recommend continued observation and resuscitative management as
deemed appropriate by the providing clinical team.

## 2020-02-27 IMAGING — DX PORTABLE CHEST - 1 VIEW
1 series · 1 of 1 positions shown · non-contrast
Comparison: 09/18/2018

CLINICAL DATA: Check endotracheal tube placement

EXAM:
PORTABLE CHEST 1 VIEW

[chest ap]
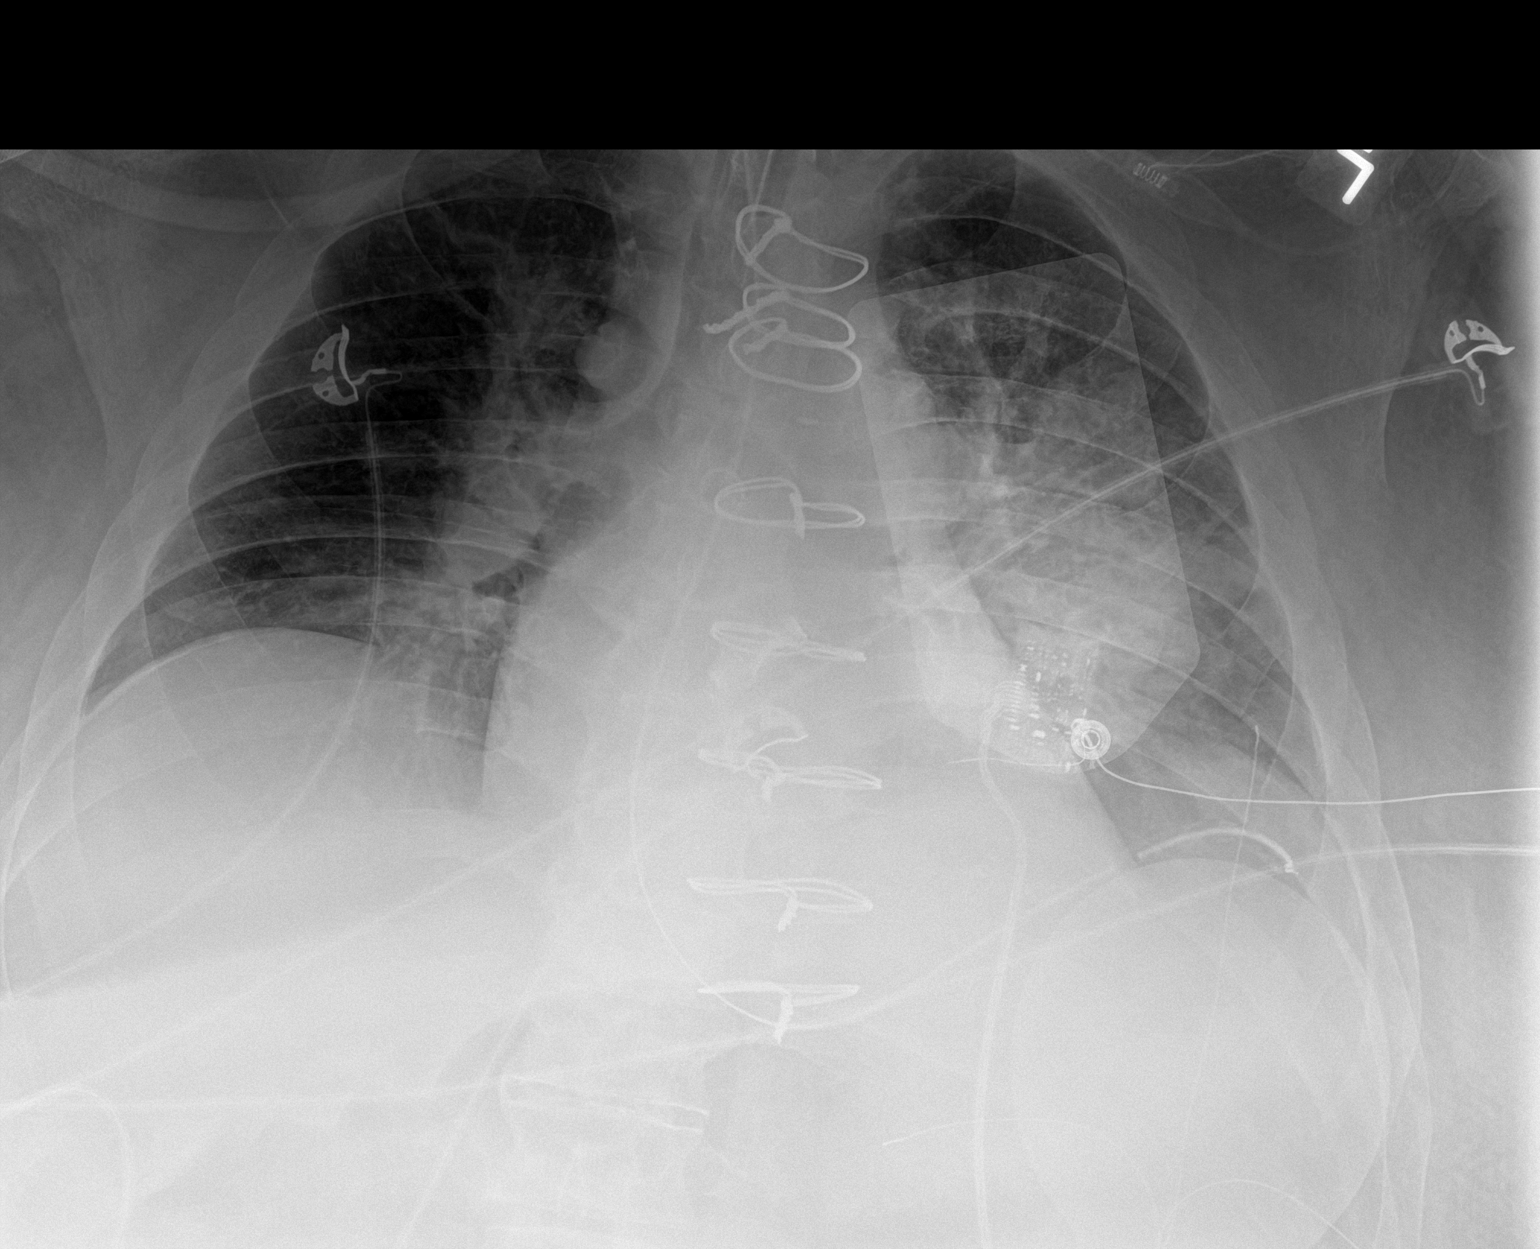

[1 of 1 positions shown; findings below may reference images not displayed]

FINDINGS: Overall inspiratory effort is poor with crowding of the vascular
markings slightly increased when compared with the prior exam. No
sizable effusion is seen. Endotracheal tube and nasogastric catheter
are noted in satisfactory position. No new focal abnormality is
noted.
IMPRESSION: Increasing perihilar markings bilaterally left greater than right
slightly increased from the prior exam.
